# Patient Record
Sex: Male | Born: 1960
Health system: Southern US, Community
[De-identification: ages and names within clinical notes are randomized; demographics above are authoritative.]

## PROBLEM LIST (undated history)

## (undated) DIAGNOSIS — C3490 Malignant neoplasm of unspecified part of unspecified bronchus or lung: Secondary | ICD-10-CM

## (undated) DIAGNOSIS — K219 Gastro-esophageal reflux disease without esophagitis: Secondary | ICD-10-CM

## (undated) DIAGNOSIS — T7840XA Allergy, unspecified, initial encounter: Secondary | ICD-10-CM

## (undated) DIAGNOSIS — G8929 Other chronic pain: Secondary | ICD-10-CM

## (undated) DIAGNOSIS — E119 Type 2 diabetes mellitus without complications: Secondary | ICD-10-CM

## (undated) DIAGNOSIS — I10 Essential (primary) hypertension: Secondary | ICD-10-CM

## (undated) DIAGNOSIS — R0600 Dyspnea, unspecified: Secondary | ICD-10-CM

## (undated) DIAGNOSIS — M1990 Unspecified osteoarthritis, unspecified site: Secondary | ICD-10-CM

## (undated) DIAGNOSIS — M549 Dorsalgia, unspecified: Secondary | ICD-10-CM

## (undated) DIAGNOSIS — G43909 Migraine, unspecified, not intractable, without status migrainosus: Secondary | ICD-10-CM

## (undated) DIAGNOSIS — I129 Hypertensive chronic kidney disease with stage 1 through stage 4 chronic kidney disease, or unspecified chronic kidney disease: Secondary | ICD-10-CM

## (undated) HISTORY — DX: Allergy, unspecified, initial encounter: T78.40XA

## (undated) HISTORY — PX: LUNG CANCER SURGERY: SHX702

## (undated) HISTORY — DX: Migraine, unspecified, not intractable, without status migrainosus: G43.909

## (undated) HISTORY — DX: Other chronic pain: G89.29

## (undated) HISTORY — PX: BACK SURGERY: SHX140

## (undated) HISTORY — PX: FOOT SURGERY: SHX648

## (undated) HISTORY — DX: Dorsalgia, unspecified: M54.9

## (undated) HISTORY — PX: LEG SURGERY: SHX1003

## (undated) HISTORY — PX: APPENDECTOMY: SHX54

## (undated) HISTORY — PX: KNEE SURGERY: SHX244

## (undated) HISTORY — PX: DG OPERATIVE LEFT HIP (ARMC HX): HXRAD1580

---

## 2014-07-07 ENCOUNTER — Other Ambulatory Visit: Payer: Self-pay

## 2014-07-07 ENCOUNTER — Emergency Department
Admission: EM | Admit: 2014-07-07 | Discharge: 2014-07-07 | Disposition: A | Discharge status: Home / Self Care | Payer: Self-pay | Attending: Emergency Medicine | Admitting: Emergency Medicine

## 2014-07-07 ENCOUNTER — Encounter: Payer: Self-pay | Admitting: Emergency Medicine

## 2014-07-07 DIAGNOSIS — Z720 Tobacco use: Secondary | ICD-10-CM | POA: Insufficient documentation

## 2014-07-07 DIAGNOSIS — R51 Headache: Secondary | ICD-10-CM | POA: Insufficient documentation

## 2014-07-07 DIAGNOSIS — G8929 Other chronic pain: Secondary | ICD-10-CM | POA: Insufficient documentation

## 2014-07-07 DIAGNOSIS — R519 Headache, unspecified: Secondary | ICD-10-CM

## 2014-07-07 DIAGNOSIS — I10 Essential (primary) hypertension: Secondary | ICD-10-CM | POA: Insufficient documentation

## 2014-07-07 HISTORY — DX: Essential (primary) hypertension: I10

## 2014-07-07 MED ORDER — AMLODIPINE BESYLATE 5 MG PO TABS
10.0000 mg | ORAL_TABLET | Freq: Once | ORAL | Status: AC
  Administered 2014-07-07: 10 mg via ORAL

## 2014-07-07 MED ORDER — AMLODIPINE BESYLATE 10 MG PO TABS: 10.0000 mg | ORAL_TABLET | Freq: Every day | ORAL | Status: DC

## 2014-07-07 MED ORDER — VALSARTAN 320 MG PO TABS: 320.0000 mg | ORAL_TABLET | Freq: Every day | ORAL | Status: DC

## 2014-07-07 MED ORDER — IRBESARTAN 150 MG PO TABS
300.0000 mg | ORAL_TABLET | Freq: Every day | ORAL | Status: DC
  Administered 2014-07-07: 300 mg via ORAL
  Filled 2014-07-07 (×3): qty 2

## 2014-07-07 MED ORDER — AMLODIPINE BESYLATE 5 MG PO TABS
ORAL_TABLET | ORAL | Status: AC
  Administered 2014-07-07: 10 mg via ORAL
  Filled 2014-07-07: qty 2

## 2014-07-07 MED ORDER — HYDROCHLOROTHIAZIDE 12.5 MG PO TABS: 25.0000 mg | ORAL_TABLET | Freq: Every day | ORAL | Status: DC

## 2014-07-07 MED ORDER — HYDROCHLOROTHIAZIDE 25 MG PO TABS
ORAL_TABLET | ORAL | Status: AC
  Administered 2014-07-07: 25 mg via ORAL
  Filled 2014-07-07: qty 1

## 2014-07-07 MED ORDER — HYDROCHLOROTHIAZIDE 25 MG PO TABS
25.0000 mg | ORAL_TABLET | Freq: Every day | ORAL | Status: DC
  Administered 2014-07-07: 25 mg via ORAL
  Filled 2014-07-07 (×2): qty 1

## 2014-07-07 NOTE — ED Provider Notes (Signed)
[facility] Emergency Department Provider Note    ____________________________________________  Time seen: 6 PM  I have reviewed the triage vital signs and the nursing notes.   HISTORY  Chief Complaint Hypertension       HPI Kristopher [name] is a 54 y.o. male with a history of hypertension who has been off of his hypertension meds for the past 2.5 weeks. The patient says that he recently moved from [place]" and has not been able to obtain a primary care physician in [place]. He says furthermore that he also does not have insurance coverage until July. He says he has tried to obtain more recent coverage but is unable to because of the amount of money that he makes. He is here because he began having a waxing and waning frontal headache since he has been off of his hypertension medications. He says that he has had the seventh headache in the past. It is dull and frontal and he rates it as a 6 out of 10 right now. There was no sudden onset of the headache or thunderclap onset. He denies any nausea vomiting diaphoresis or chest pain.He denies any numbness or weakness as well.   Past Medical History  Diagnosis Date  . Hypertension     There are no active problems to display for this patient.   Past Surgical History  Procedure Laterality Date  . Back surgery    . Appendectomy    . Leg surgery      Current Outpatient Rx  Name  Route  Sig  Dispense  Refill  . oxyCODONE-acetaminophen (PERCOCET) 10-325 MG per tablet   Oral   Take 1 tablet by mouth every 4 (four) hours as needed for pain.           Allergies Lidocaine  History reviewed. No pertinent family history.  Social History History  Substance Use Topics  . Smoking status: Current Some Day Smoker  . Smokeless tobacco: Not on file  . Alcohol Use: No    Review of Systems  Constitutional: Negative for fever. Eyes: Negative for visual changes. ENT: Negative for sore  throat. Cardiovascular: Negative for chest pain. Respiratory: Negative for shortness of breath. Gastrointestinal: Negative for abdominal pain, vomiting and diarrhea. Genitourinary: Negative for dysuria. Musculoskeletal: Negative for back pain. Skin: Negative for rash. Neurological: 6 out of 10 frontal headache.   10-point ROS otherwise negative.  ____________________________________________   PHYSICAL EXAM:  VITAL SIGNS: ED Triage Vitals  Enc Vitals Group     BP 07/07/14 1641 188/111 mmHg     Pulse Rate 07/07/14 1641 81     Resp 07/07/14 1641 18     Temp 07/07/14 1641 98.8 F (37.1 C)     Temp Source 07/07/14 1641 Oral     SpO2 07/07/14 1641 98 %     Weight 07/07/14 1641 200 lb (90.719 kg)     Height 07/07/14 1641 '5\' 10"'$  (1.778 m)     Head Cir --      Peak Flow --      Pain Score 07/07/14 1642 10     Pain Loc --      Pain Edu? --      Excl. in [place]? --      Constitutional: Alert and oriented. Well appearing and in no distress. Eyes: Conjunctivae are normal. PERRL. Normal extraocular movements. ENT   Head: Normocephalic and atraumatic.   Nose: No congestion/rhinnorhea.   Mouth/Throat: Mucous membranes are moist.   Neck:  No stridor. Hematological/Lymphatic/Immunilogical: No cervical lymphadenopathy. Cardiovascular: Normal rate, regular rhythm. Normal and symmetric distal pulses are present in all extremities. No murmurs, rubs, or gallops. Respiratory: Normal respiratory effort without tachypnea nor retractions. Breath sounds are clear and equal bilaterally. No wheezes/rales/rhonchi. Gastrointestinal: Soft and nontender. No distention. No abdominal bruits. There is no CVA tenderness. Genitourinary: Deferred Musculoskeletal: Nontender with normal range of motion in all extremities. No joint effusions.  No lower extremity tenderness nor edema. Neurologic:  Normal speech and language. No gross focal neurologic deficits are appreciated. Speech is normal. No gait  instability. Skin:  Skin is warm, dry and intact. No rash noted. Psychiatric: Mood and affect are normal. Speech and behavior are normal. Patient exhibits appropriate insight and judgment.  ____________________________________________    LABS (pertinent positives/negatives)    ____________________________________________   EKG   Date: 07/07/2014  Rate: 71  Rhythm: normal sinus rhythm  QRS Axis: normal  Intervals: normal  ST/T Wave abnormalities: normal  Conduction Disutrbances: none  Narrative Interpretation: unremarkable      ____________________________________________    RADIOLOGY    ____________________________________________   PROCEDURES  Procedure(s) performed:   Critical Care performed:   ____________________________________________   INITIAL IMPRESSION / ASSESSMENT AND PLAN / ED COURSE  Pertinent labs & imaging results that were available during my care of the patient were reviewed by me and considered in my medical decision making (see chart for details).  The patient is well-appearing. It is likely that his headache is caused by his blood pressure. I do not see any signs of neurologic impairment. I do not feel that imaging or lab work at this time is necessary. We will try him stepwise to restart his blood pressure medications. We will monitor his blood pressure number emergency Department and monitor him for improvement of his symptoms.  ----------------------------------------- 7:32 PM on 07/07/2014 -----------------------------------------  Patient continues to have a headache and blood pressures only mildly improved. We'll give angiotensin receptor blocker in addition to other 2 medications and reassess. Patient continues to be neurologically intact. In no distress.  ----------------------------------------- 8:34 PM on 07/07/2014 -----------------------------------------  After being given ARB, the patient's blood pressure has resolved  to 140/100. The patient says he is feeling much better and the headache has resolved. I will prescribe him his home medications and give him follow-up with the [facility]. Still remains neurologically intact.  No chest pain or shortness of breath throughout.   __________________________________________   FINAL CLINICAL IMPRESSION(S) / ED DIAGNOSES  Acute on chronic hypertension. Headache secondary to hypertension. Acute, initial visit.   Doran Stabler, MD 07/07/14 2035

## 2014-07-07 NOTE — ED Notes (Signed)
Pt reports that he has had a headache for the last several days and also had hypertension. He reports that he has been out of his B/P medication for the last week.

## 2014-07-07 NOTE — ED Notes (Signed)
Pt with headache for two days along with high blood pressure. Has been out of meds for one week.

## 2014-11-11 ENCOUNTER — Encounter: Payer: Self-pay | Admitting: Emergency Medicine

## 2014-11-11 ENCOUNTER — Emergency Department
Admission: EM | Admit: 2014-11-11 | Discharge: 2014-11-11 | Disposition: A | Discharge status: Home / Self Care | Payer: Medicare Other | Attending: Emergency Medicine | Admitting: Emergency Medicine

## 2014-11-11 DIAGNOSIS — Z880 Allergy status to penicillin: Secondary | ICD-10-CM | POA: Diagnosis not present

## 2014-11-11 DIAGNOSIS — G8921 Chronic pain due to trauma: Secondary | ICD-10-CM | POA: Diagnosis not present

## 2014-11-11 DIAGNOSIS — M549 Dorsalgia, unspecified: Secondary | ICD-10-CM | POA: Insufficient documentation

## 2014-11-11 DIAGNOSIS — G8929 Other chronic pain: Secondary | ICD-10-CM

## 2014-11-11 DIAGNOSIS — Z720 Tobacco use: Secondary | ICD-10-CM | POA: Insufficient documentation

## 2014-11-11 DIAGNOSIS — I10 Essential (primary) hypertension: Secondary | ICD-10-CM | POA: Insufficient documentation

## 2014-11-11 DIAGNOSIS — Z79899 Other long term (current) drug therapy: Secondary | ICD-10-CM | POA: Insufficient documentation

## 2014-11-11 MED ORDER — TRAMADOL HCL 50 MG PO TABS: 50.0000 mg | ORAL_TABLET | Freq: Four times a day (QID) | ORAL | Status: DC | PRN

## 2014-11-11 NOTE — ED Provider Notes (Signed)
[facility] Emergency Department Provider Note  ____________________________________________  Time seen: Approximately 10:53 AM  I have reviewed the triage vital signs and the nursing notes.   HISTORY  Chief Complaint Back Pain   HPI Kristopher [name] is a 54 y.o. male is here for refill on his Percocet. He states that he has chronic back pain due to workman's comp injury and moved from [place] to [place]. He states he has an appointment with the pain clinic here in October but he is being required to see a psychiatrist for evaluation first. He states there is no change in his back pain. There is no bladder or bowel incontinence. He denies any paresthesias in his legs. Currently his pain is 6 out of 10.   Past Medical History  Diagnosis Date  . Hypertension     There are no active problems to display for this patient.   Past Surgical History  Procedure Laterality Date  . Back surgery    . Appendectomy    . Leg surgery      Current Outpatient Rx  Name  Route  Sig  Dispense  Refill  . amLODipine (NORVASC) 10 MG tablet   Oral   Take 1 tablet (10 mg total) by mouth daily.   30 tablet   0   . hydrochlorothiazide (HYDRODIURIL) 12.5 MG tablet   Oral   Take 2 tablets (25 mg total) by mouth daily.   30 tablet   0   . oxyCODONE-acetaminophen (PERCOCET) 10-325 MG per tablet   Oral   Take 1 tablet by mouth every 4 (four) hours as needed for pain.         . traMADol (ULTRAM) 50 MG tablet   Oral   Take 1 tablet (50 mg total) by mouth every 6 (six) hours as needed.   20 tablet   0   . valsartan (DIOVAN) 320 MG tablet   Oral   Take 1 tablet (320 mg total) by mouth daily.   30 tablet   0     Allergies Lidocaine and Penicillins  No family history on file.  Social History Social History  Substance Use Topics  . Smoking status: Current Some Day Smoker  . Smokeless tobacco: None  . Alcohol Use: No    Review of  Systems Constitutional: No fever/chills ENT: No sore throat. Cardiovascular: Denies chest pain. Respiratory: Denies shortness of breath. Gastrointestinal: No abdominal pain.  No nausea, no vomiting.  No diarrhea.  No constipation. Genitourinary: Negative for dysuria. Musculoskeletal: Positive for back pain. Skin: Negative for rash. Neurological: Negative for headaches, focal weakness or numbness.  10-point ROS otherwise negative.  ____________________________________________   PHYSICAL EXAM:  VITAL SIGNS: ED Triage Vitals  Enc Vitals Group     BP 11/11/14 1017 132/95 mmHg     Pulse Rate 11/11/14 1017 74     Resp 11/11/14 1017 18     Temp 11/11/14 1017 98.1 F (36.7 C)     Temp src --      SpO2 11/11/14 1017 100 %     Weight --      Height --      Head Cir --      Peak Flow --      Pain Score 11/11/14 1017 6     Pain Loc --      Pain Edu? --      Excl. in [place]? --     Constitutional: Alert and oriented. Well appearing and  in no acute distress. Patient is wearing his back brace. Eyes: Conjunctivae are normal. PERRL. EOMI. Head: Atraumatic. Nose: No congestion/rhinnorhea. Neck: No stridor.   Cardiovascular: Normal rate, regular rhythm. Grossly normal heart sounds.  Good peripheral circulation. Respiratory: Normal respiratory effort.  No retractions. Lungs CTAB. Gastrointestinal: Soft and nontender. No distention. No abdominal bruits. No CVA tenderness. Musculoskeletal: Back exam shows tenderness on palpation lower lumbar spine bilaterally. No active muscle spasms were seen in patient's range of motion was slightly restricted secondary to back brace. Muscle strength bilaterally lower legs was equal. No lower extremity tenderness nor edema.  No joint effusions. Straight leg raises was approximately 45 with some discomfort. Neurologic:  Normal speech and language. No gross focal neurologic deficits are appreciated. No gait instability. Reflexes 1+ bilaterally Skin:  Skin is  warm, dry and intact. No rash noted. Psychiatric: Mood and affect are normal. Speech and behavior are normal.  ____________________________________________   LABS (all labs ordered are listed, but only abnormal results are displayed)  Labs Reviewed - No data to display  PROCEDURES  Procedure(s) performed: None  Critical Care performed: No  ____________________________________________   INITIAL IMPRESSION / ASSESSMENT AND PLAN / ED COURSE  Pertinent labs & imaging results that were available during my care of the patient were reviewed by me and considered in my medical decision making (see chart for details).  Patient was given a prescription for tramadol No. 20 tablets. He was told that we do not refill Percocet and that he will need to obtain a primary care doctor as well. He states his blood pressure is only up when he is having problems with his back. He is currently taking something for hypertension. He was encouraged to keep this appointment with his psychiatrist and also his appointment at the pain clinic. ____________________________________________   FINAL CLINICAL IMPRESSION(S) / ED DIAGNOSES  Final diagnoses:  Chronic back pain greater than 3 months duration      Johnn Hai, PA-C 11/11/14 [place], MD 11/12/14 [phone]

## 2014-11-11 NOTE — ED Notes (Signed)
Pt reports moving here from [place]. Pt ran out of pain meds and needs a refill. Pt reports having an appointment with pain clinic coming up

## 2014-11-11 NOTE — Discharge Instructions (Signed)
° °  Keep your appointment with Pain Clinic for October

## 2014-11-11 NOTE — ED Notes (Signed)
States he is out of percocet . States he has chronic back pain d/t wc injury. Has an appointment for new pain clinic in oct. Ambulates well

## 2014-12-05 ENCOUNTER — Encounter: Payer: Self-pay | Admitting: Pain Medicine

## 2014-12-05 ENCOUNTER — Ambulatory Visit: Payer: Medicare Other | Attending: Pain Medicine | Admitting: Pain Medicine

## 2014-12-05 VITALS — BP 162/110 | HR 84 | Temp 98.7°F | Resp 18 | Ht 70.0 in | Wt 210.0 lb

## 2014-12-05 DIAGNOSIS — M79605 Pain in left leg: Secondary | ICD-10-CM

## 2014-12-05 DIAGNOSIS — M545 Low back pain, unspecified: Secondary | ICD-10-CM

## 2014-12-05 DIAGNOSIS — M792 Neuralgia and neuritis, unspecified: Secondary | ICD-10-CM

## 2014-12-05 DIAGNOSIS — M79602 Pain in left arm: Secondary | ICD-10-CM

## 2014-12-05 DIAGNOSIS — M961 Postlaminectomy syndrome, not elsewhere classified: Secondary | ICD-10-CM

## 2014-12-05 DIAGNOSIS — M542 Cervicalgia: Secondary | ICD-10-CM | POA: Diagnosis not present

## 2014-12-05 DIAGNOSIS — F1120 Opioid dependence, uncomplicated: Secondary | ICD-10-CM | POA: Insufficient documentation

## 2014-12-05 DIAGNOSIS — Z79899 Other long term (current) drug therapy: Secondary | ICD-10-CM | POA: Insufficient documentation

## 2014-12-05 DIAGNOSIS — G894 Chronic pain syndrome: Secondary | ICD-10-CM

## 2014-12-05 DIAGNOSIS — M4712 Other spondylosis with myelopathy, cervical region: Secondary | ICD-10-CM

## 2014-12-05 DIAGNOSIS — G8929 Other chronic pain: Secondary | ICD-10-CM

## 2014-12-05 MED ORDER — OXYCODONE HCL 10 MG PO TABS: 10.0000 mg | ORAL_TABLET | Freq: Four times a day (QID) | ORAL | Status: DC | PRN

## 2014-12-05 NOTE — Patient Instructions (Signed)
Smoking Cessation Quitting smoking is important to your health and has many advantages. However, it is not always easy to quit since nicotine is a very addictive drug. Oftentimes, people try 3 times or more before being able to quit. This document explains the best ways for you to prepare to quit smoking. Quitting takes hard work and a lot of effort, but you can do it. ADVANTAGES OF QUITTING SMOKING  You will live longer, feel better, and live better.  Your body will feel the impact of quitting smoking almost immediately.  Within 20 minutes, blood pressure decreases. Your pulse returns to its normal level.  After 8 hours, carbon monoxide levels in the blood return to normal. Your oxygen level increases.  After 24 hours, the chance of having a heart attack starts to decrease. Your breath, hair, and body stop smelling like smoke.  After 48 hours, damaged nerve endings begin to recover. Your sense of taste and smell improve.  After 72 hours, the body is virtually free of nicotine. Your bronchial tubes relax and breathing becomes easier.  After 2 to 12 weeks, lungs can hold more air. Exercise becomes easier and circulation improves.  The risk of having a heart attack, stroke, cancer, or lung disease is greatly reduced.  After 1 year, the risk of coronary heart disease is cut in half.  After 5 years, the risk of stroke falls to the same as a nonsmoker.  After 10 years, the risk of lung cancer is cut in half and the risk of other cancers decreases significantly.  After 15 years, the risk of coronary heart disease drops, usually to the level of a nonsmoker.  If you are pregnant, quitting smoking will improve your chances of having a healthy baby.  The people you live with, especially any children, will be healthier.  You will have extra money to spend on things other than cigarettes. QUESTIONS TO THINK ABOUT BEFORE ATTEMPTING TO QUIT You may want to talk about your answers with your  health care provider.  Why do you want to quit?  If you tried to quit in the past, what helped and what did not?  What will be the most difficult situations for you after you quit? How will you plan to handle them?  Who can help you through the tough times? Your family? Friends? A health care provider?  What pleasures do you get from smoking? What ways can you still get pleasure if you quit? Here are some questions to ask your health care provider:  How can you help me to be successful at quitting?  What medicine do you think would be best for me and how should I take it?  What should I do if I need more help?  What is smoking withdrawal like? How can I get information on withdrawal? GET READY  Set a quit date.  Change your environment by getting rid of all cigarettes, ashtrays, matches, and lighters in your home, car, or work. Do not let people smoke in your home.  Review your past attempts to quit. Think about what worked and what did not. GET SUPPORT AND ENCOURAGEMENT You have a better chance of being successful if you have help. You can get support in many ways.  Tell your family, friends, and coworkers that you are going to quit and need their support. Ask them not to smoke around you.  Get individual, group, or telephone counseling and support. Programs are available at local hospitals and health centers. Call   your local health department for information about programs in your area.  Spiritual beliefs and practices may help some smokers quit.  Download a "quit meter" on your computer to keep track of quit statistics, such as how long you have gone without smoking, cigarettes not smoked, and money saved.  Get a self-help book about quitting smoking and staying off tobacco. LEARN NEW SKILLS AND BEHAVIORS  Distract yourself from urges to smoke. Talk to someone, go for a walk, or occupy your time with a task.  Change your normal routine. Take a different route to work.  Drink tea instead of coffee. Eat breakfast in a different place.  Reduce your stress. Take a hot bath, exercise, or read a book.  Plan something enjoyable to do every day. Reward yourself for not smoking.  Explore interactive web-based programs that specialize in helping you quit. GET MEDICINE AND USE IT CORRECTLY Medicines can help you stop smoking and decrease the urge to smoke. Combining medicine with the above behavioral methods and support can greatly increase your chances of successfully quitting smoking.  Nicotine replacement therapy helps deliver nicotine to your body without the negative effects and risks of smoking. Nicotine replacement therapy includes nicotine gum, lozenges, inhalers, nasal sprays, and skin patches. Some may be available over-the-counter and others require a prescription.  Antidepressant medicine helps people abstain from smoking, but how this works is unknown. This medicine is available by prescription.  Nicotinic receptor partial agonist medicine simulates the effect of nicotine in your brain. This medicine is available by prescription. Ask your health care provider for advice about which medicines to use and how to use them based on your health history. Your health care provider will tell you what side effects to look out for if you choose to be on a medicine or therapy. Carefully read the information on the package. Do not use any other product containing nicotine while using a nicotine replacement product.  RELAPSE OR DIFFICULT SITUATIONS Most relapses occur within the first 3 months after quitting. Do not be discouraged if you start smoking again. Remember, most people try several times before finally quitting. You may have symptoms of withdrawal because your body is used to nicotine. You may crave cigarettes, be irritable, feel very hungry, cough often, get headaches, or have difficulty concentrating. The withdrawal symptoms are only temporary. They are strongest  when you first quit, but they will go away within 10-14 days. To reduce the chances of relapse, try to:  Avoid drinking alcohol. Drinking lowers your chances of successfully quitting.  Reduce the amount of caffeine you consume. Once you quit smoking, the amount of caffeine in your body increases and can give you symptoms, such as a rapid heartbeat, sweating, and anxiety.  Avoid smokers because they can make you want to smoke.  Do not let weight gain distract you. Many smokers will gain weight when they quit, usually less than 10 pounds. Eat a healthy diet and stay active. You can always lose the weight gained after you quit.  Find ways to improve your mood other than smoking. FOR MORE INFORMATION  www.smokefree.gov  Document Released: 02/12/2001 Document Revised: 07/05/2013 Document Reviewed: 05/30/2011 [employer] Patient Information 2015 [employer]. This information is not intended to replace advice given to you by your health care provider. Make sure you discuss any questions you have with your health care provider.  

## 2014-12-05 NOTE — Progress Notes (Signed)
Safety precautions to be maintained throughout the outpatient stay will include: orient to surroundings, keep bed in low position, maintain call bell within reach at all times, provide assistance with transfer out of bed and ambulation. Pill count oxycodone #1 Tramadol #2

## 2014-12-13 ENCOUNTER — Other Ambulatory Visit: Payer: Self-pay | Admitting: Pain Medicine

## 2015-01-05 ENCOUNTER — Encounter: Payer: Self-pay | Admitting: Pain Medicine

## 2015-01-05 ENCOUNTER — Ambulatory Visit: Payer: Medicare HMO | Attending: Pain Medicine | Admitting: Pain Medicine

## 2015-01-05 ENCOUNTER — Other Ambulatory Visit: Payer: Self-pay | Admitting: Pain Medicine

## 2015-01-05 VITALS — BP 134/98 | HR 72 | Temp 98.2°F | Resp 16 | Ht 70.0 in | Wt 205.0 lb

## 2015-01-05 DIAGNOSIS — M5441 Lumbago with sciatica, right side: Secondary | ICD-10-CM

## 2015-01-05 DIAGNOSIS — M5442 Lumbago with sciatica, left side: Secondary | ICD-10-CM

## 2015-01-05 DIAGNOSIS — M4802 Spinal stenosis, cervical region: Secondary | ICD-10-CM | POA: Diagnosis not present

## 2015-01-05 DIAGNOSIS — G8929 Other chronic pain: Secondary | ICD-10-CM | POA: Insufficient documentation

## 2015-01-05 DIAGNOSIS — F1120 Opioid dependence, uncomplicated: Secondary | ICD-10-CM

## 2015-01-05 DIAGNOSIS — M542 Cervicalgia: Secondary | ICD-10-CM | POA: Insufficient documentation

## 2015-01-05 DIAGNOSIS — M5412 Radiculopathy, cervical region: Secondary | ICD-10-CM

## 2015-01-05 DIAGNOSIS — Z79899 Other long term (current) drug therapy: Secondary | ICD-10-CM

## 2015-01-05 DIAGNOSIS — M47816 Spondylosis without myelopathy or radiculopathy, lumbar region: Secondary | ICD-10-CM | POA: Diagnosis not present

## 2015-01-05 DIAGNOSIS — M545 Low back pain, unspecified: Secondary | ICD-10-CM

## 2015-01-05 DIAGNOSIS — M47812 Spondylosis without myelopathy or radiculopathy, cervical region: Secondary | ICD-10-CM | POA: Diagnosis not present

## 2015-01-05 DIAGNOSIS — M539 Dorsopathy, unspecified: Secondary | ICD-10-CM

## 2015-01-05 DIAGNOSIS — M549 Dorsalgia, unspecified: Secondary | ICD-10-CM | POA: Diagnosis present

## 2015-01-05 DIAGNOSIS — M4726 Other spondylosis with radiculopathy, lumbar region: Secondary | ICD-10-CM | POA: Diagnosis not present

## 2015-01-05 DIAGNOSIS — M5416 Radiculopathy, lumbar region: Secondary | ICD-10-CM | POA: Diagnosis not present

## 2015-01-05 DIAGNOSIS — M961 Postlaminectomy syndrome, not elsewhere classified: Secondary | ICD-10-CM

## 2015-01-05 DIAGNOSIS — F1190 Opioid use, unspecified, uncomplicated: Secondary | ICD-10-CM

## 2015-01-05 DIAGNOSIS — Z5181 Encounter for therapeutic drug level monitoring: Secondary | ICD-10-CM | POA: Insufficient documentation

## 2015-01-05 DIAGNOSIS — Z79891 Long term (current) use of opiate analgesic: Secondary | ICD-10-CM | POA: Insufficient documentation

## 2015-01-05 MED ORDER — OXYCODONE HCL 10 MG PO TABS: 10.0000 mg | ORAL_TABLET | Freq: Four times a day (QID) | ORAL | Status: DC | PRN

## 2015-01-05 NOTE — Progress Notes (Signed)
Patient's Name: Kristopher [name] MRN: [id number] DOB: [birth year] DOS: 01/05/2015  Primary Reason(s) for Visit: Encounter for Medication Management. CC: Back Pain and Neck Pain   HPI:   Kristopher [name] is a 54 y.o. year old, male patient, who returns today as an established patient. He has Chronic pain; Chronic low back pain; Lumbar spondylosis; Chronic radicular lumbar pain (left) (S1 dermatomal); Failed back surgical syndrome; Chronic neck pain; Cervical spondylosis; Chronic cervical radicular pain (left side) (C5/C6 dermatome); Long term current use of opiate analgesic; Long term prescription opiate use; Opiate use; Opioid dependence ([place]); Encounter for therapeutic drug level monitoring; Cervical spinal stenosis (C4-5); and Cervical foraminal stenosis (Bilateral C5-6) on his problem list.. His primarily concern today is the Back Pain and Neck Pain    The patient returns today after his initial prescription. The results of the urine drug screen test with abnormal and they were discussed with the patient. The patient was given a final warning with her as to this. He understood and accepted. I will repeat a UDS and see him back in one month. Today's Pain Score: 5  Pain Descriptors / Indicators: Burning, Stabbing, Constant Pain Frequency: Constant  Date of Last Visit: Date of Last Visit: 12/05/14 Service Provided on Last Visit: Service Provided on Last Visit: Evaluation, Med Refill  Pharmacotherapy Review:   Side-effects or Adverse reactions: None reported. Effectiveness: Described as relatively effective, allowing for increase in activities of daily living (ADL). Onset of action: Within expected pharmacological parameters. Duration of action: Within normal limits for medication. Peak effect: Timing and results are as within normal expected parameters.  PMP: Compliant with practice rules and regulations. DST: Abnormal. Results discussed with patient. Unreported substances found on the UDS. No  tramadol. Hydrocodone identified. Today I have curetted the patient on last and final warning with regards to this. Lab work: No new labs ordered by our practice. Treatment compliance: Compliant. Substance Use Disorder (SUD) Risk Level: Low Planned course of action: Continue therapy as is.  Allergies: Kristopher [name] is allergic to lidocaine and penicillins.  Meds: The patient has a current medication list which includes the following prescription(s): amlodipine, oxycodone hcl, and sumatriptan. Requested Prescriptions   Signed Prescriptions Disp Refills  . Oxycodone HCl 10 MG TABS 120 tablet 0    Sig: Take 1 tablet (10 mg total) by mouth every 6 (six) hours as needed.    ROS: Constitutional: Afebrile, no chills, well hydrated and well nourished Gastrointestinal: negative Musculoskeletal:negative Neurological: negative Behavioral/Psych: negative  PFSH: Medical:  Kristopher [name]  has a past medical history of Hypertension and Allergy. Family: family history includes Cancer in his father. Surgical:  has past surgical history that includes Back surgery; Appendectomy; and Leg Surgery. Tobacco:  reports that he has been smoking.  He does not have any smokeless tobacco history on file. Alcohol:  reports that he does not drink alcohol. Drug:  has no drug history on file.  Physical Exam: Vitals:  Today's Vitals   01/05/15 1036  BP: 134/98  Pulse: 72  Temp: 98.2 F (36.8 C)  TempSrc: Oral  Resp: 16  Height: '5\' 10"'$  (1.778 m)  Weight: 205 lb (92.987 kg)  SpO2: 98%  PainSc: 5   Calculated BMI: Body mass index is 29.41 kg/(m^2). General appearance: alert, cooperative, appears stated age, no distress and moderately obese Eyes: conjunctivae/corneas clear. PERRL, EOM's intact. Fundi benign. Lungs: No evidence respiratory distress, no audible rales or ronchi and no use of accessory muscles of respiration Neck: no adenopathy, no carotid  bruit, no JVD, supple, symmetrical, trachea midline and  thyroid not enlarged, symmetric, no tenderness/mass/nodules Back: symmetric, no curvature. ROM normal. No CVA tenderness. Extremities: extremities normal, atraumatic, no cyanosis or edema Pulses: 2+ and symmetric Skin: Skin color, texture, turgor normal. No rashes or lesions Neurologic: Grossly normal    Assessment: Encounter Diagnosis:  Primary Diagnosis: Chronic pain [G89.29]  Plan: Kristopher [name] was seen today for back pain and neck pain.  Diagnoses and all orders for this visit:  Chronic pain -     Oxycodone HCl 10 MG TABS; Take 1 tablet (10 mg total) by mouth every 6 (six) hours as needed.  Chronic low back pain  Osteoarthritis of spine with radiculopathy, lumbar region  Chronic radicular lumbar pain (left) (S1 dermatomal)  Failed back surgical syndrome  Chronic neck pain  Cervical spondylosis  Chronic cervical radicular pain (left side) (C5/C6 dermatome)  Long term current use of opiate analgesic -     Drugs of abuse screen w/o alc, rtn urine-sln; Standing  Long term prescription opiate use  Opiate use  Uncomplicated opioid dependence ([place])  Encounter for therapeutic drug level monitoring  Cervical spinal stenosis (C4-5)  Cervical foraminal stenosis (Bilateral C5-6)     There are no Patient Instructions on file for this visit. Medications discontinued today:  Medications Discontinued During This Encounter  Medication Reason  . amLODipine-valsartan (EXFORGE) 10-320 MG tablet Error  . hydrochlorothiazide (HYDRODIURIL) 12.5 MG tablet Error  . valsartan (DIOVAN) 320 MG tablet Error  . Oxycodone HCl 10 MG TABS Reorder   Medications administered today:  Kristopher [name] had no medications administered during this visit.  Primary Care Physician: Freddy Finner, NP Location: [facility] Note by: Beatriz Chancellor A. Dossie Arbour, M.D, DABA, DABAPM, DABPM, DABIPP, FIPP

## 2015-01-05 NOTE — Progress Notes (Signed)
Safety precautions to be maintained throughout the outpatient stay will include: orient to surroundings, keep bed in low position, maintain call bell within reach at all times, provide assistance with transfer out of bed and ambulation.  #4 oxycodone'10mg'$  at pill count today.

## 2015-01-06 DIAGNOSIS — M4802 Spinal stenosis, cervical region: Secondary | ICD-10-CM | POA: Insufficient documentation

## 2015-01-14 LAB — TOXASSURE SELECT 13 (MW), URINE: PDF: 0

## 2015-01-28 ENCOUNTER — Emergency Department
Admission: EM | Admit: 2015-01-28 | Discharge: 2015-01-28 | Disposition: A | Discharge status: Home / Self Care | Payer: Medicare HMO | Attending: Emergency Medicine | Admitting: Emergency Medicine

## 2015-01-28 DIAGNOSIS — K047 Periapical abscess without sinus: Secondary | ICD-10-CM

## 2015-01-28 DIAGNOSIS — K029 Dental caries, unspecified: Secondary | ICD-10-CM | POA: Diagnosis not present

## 2015-01-28 DIAGNOSIS — Z79899 Other long term (current) drug therapy: Secondary | ICD-10-CM | POA: Diagnosis not present

## 2015-01-28 DIAGNOSIS — K0381 Cracked tooth: Secondary | ICD-10-CM | POA: Insufficient documentation

## 2015-01-28 DIAGNOSIS — K0889 Other specified disorders of teeth and supporting structures: Secondary | ICD-10-CM | POA: Diagnosis present

## 2015-01-28 DIAGNOSIS — Z880 Allergy status to penicillin: Secondary | ICD-10-CM | POA: Diagnosis not present

## 2015-01-28 DIAGNOSIS — F17200 Nicotine dependence, unspecified, uncomplicated: Secondary | ICD-10-CM | POA: Insufficient documentation

## 2015-01-28 DIAGNOSIS — I10 Essential (primary) hypertension: Secondary | ICD-10-CM | POA: Diagnosis not present

## 2015-01-28 LAB — CBC WITH DIFFERENTIAL/PLATELET
Basophils Absolute: 0.1 10*3/uL (ref 0–0.1)
Basophils Relative: 0 %
Eosinophils Absolute: 0 10*3/uL (ref 0–0.7)
Eosinophils Relative: 0 %
HCT: 40.6 % (ref 40.0–52.0)
Hemoglobin: 13.9 g/dL (ref 13.0–18.0)
Lymphocytes Relative: 4 %
Lymphs Abs: 0.7 10*3/uL — ABNORMAL LOW (ref 1.0–3.6)
MCH: 29.5 pg (ref 26.0–34.0)
MCHC: 34.3 g/dL (ref 32.0–36.0)
MCV: 86.2 fL (ref 80.0–100.0)
Monocytes Absolute: 1.2 10*3/uL — ABNORMAL HIGH (ref 0.2–1.0)
Monocytes Relative: 6 %
Neutro Abs: 16.8 10*3/uL — ABNORMAL HIGH (ref 1.4–6.5)
Neutrophils Relative %: 90 %
Platelets: 161 10*3/uL (ref 150–440)
RBC: 4.71 MIL/uL (ref 4.40–5.90)
RDW: 13.3 % (ref 11.5–14.5)
WBC: 18.9 10*3/uL — ABNORMAL HIGH (ref 3.8–10.6)

## 2015-01-28 LAB — BASIC METABOLIC PANEL
Anion gap: 9 (ref 5–15)
BUN: 12 mg/dL (ref 6–20)
CO2: 22 mmol/L (ref 22–32)
Calcium: 9.3 mg/dL (ref 8.9–10.3)
Chloride: 106 mmol/L (ref 101–111)
Creatinine, Ser: 1.16 mg/dL (ref 0.61–1.24)
GFR calc Af Amer: >60 mL/min (ref >60)
GFR calc non Af Amer: >60 mL/min (ref >60)
Glucose, Bld: 104 mg/dL — ABNORMAL HIGH (ref 65–99)
Potassium: 3.3 mmol/L — ABNORMAL LOW (ref 3.5–5.1)
Sodium: 137 mmol/L (ref 135–145)

## 2015-01-28 MED ORDER — KETOROLAC TROMETHAMINE 10 MG PO TABS: 10.0000 mg | ORAL_TABLET | Freq: Three times a day (TID) | ORAL | Status: DC

## 2015-01-28 MED ORDER — CLINDAMYCIN HCL 300 MG PO CAPS: 300.0000 mg | ORAL_CAPSULE | Freq: Four times a day (QID) | ORAL | Status: DC

## 2015-01-28 MED ORDER — SODIUM CHLORIDE 0.9 % IV BOLUS (SEPSIS)
1000.0000 mL | Freq: Once | INTRAVENOUS | Status: AC
  Administered 2015-01-28: 1000 mL via INTRAVENOUS

## 2015-01-28 MED ORDER — KETOROLAC TROMETHAMINE 30 MG/ML IJ SOLN
30.0000 mg | Freq: Once | INTRAMUSCULAR | Status: AC
  Administered 2015-01-28: 30 mg via INTRAVENOUS
  Filled 2015-01-28: qty 1

## 2015-01-28 MED ORDER — CLINDAMYCIN PHOSPHATE 600 MG/50ML IV SOLN
600.0000 mg | Freq: Once | INTRAVENOUS | Status: AC
  Administered 2015-01-28: 600 mg via INTRAVENOUS
  Filled 2015-01-28: qty 50

## 2015-01-28 NOTE — ED Notes (Signed)
Rt sided tooth ache since yesterday.

## 2015-01-28 NOTE — Discharge Instructions (Signed)
Dental Abscess A dental abscess is a collection of pus in or around a tooth. CAUSES This condition is caused by a bacterial infection around the root of the tooth that involves the inner part of the tooth (pulp). It may result from:  Severe tooth decay.  Trauma to the tooth that allows bacteria to enter into the pulp, such as a broken or chipped tooth.  Severe gum disease around a tooth. SYMPTOMS Symptoms of this condition include:  Severe pain in and around the infected tooth.  Swelling and redness around the infected tooth, in the mouth, or in the face.  Tenderness.  Pus drainage.  Bad breath.  Bitter taste in the mouth.  Difficulty swallowing.  Difficulty opening the mouth.  Nausea.  Vomiting.  Chills.  Swollen neck glands.  Fever. DIAGNOSIS This condition is diagnosed with examination of the infected tooth. During the exam, your dentist may tap on the infected tooth. Your dentist will also ask about your medical and dental history and may order X-rays. TREATMENT This condition is treated by eliminating the infection. This may be done with:  Antibiotic medicine.  A root canal. This may be performed to save the tooth.  Pulling (extracting) the tooth. This may also involve draining the abscess. This is done if the tooth cannot be saved. HOME CARE INSTRUCTIONS  Take medicines only as directed by your dentist.  If you were prescribed antibiotic medicine, finish all of it even if you start to feel better.  Rinse your mouth (gargle) often with salt water to relieve pain or swelling.  Do not drive or operate heavy machinery while taking pain medicine.  Do not apply heat to the outside of your mouth.  Keep all follow-up visits as directed by your dentist. This is important. SEEK MEDICAL CARE IF:  Your pain is worse and is not helped by medicine. SEEK IMMEDIATE MEDICAL CARE IF:  You have a fever or chills.  Your symptoms suddenly get worse.  You have a  very bad headache.  You have problems breathing or swallowing.  You have trouble opening your mouth.  You have swelling in your neck or around your eye.   This information is not intended to replace advice given to you by your health care provider. Make sure you discuss any questions you have with your health care provider.   Document Released: 02/18/2005 Document Revised: 07/05/2014 Document Reviewed: 02/15/2014 Elsevier Interactive Patient Education 2016 [place] the prescription antibiotic as directed, until completely gone. Rinse regularly with warm, salty water. Follow-up with one of the dental providers listed below.   OPTIONS FOR DENTAL FOLLOW UP CARE  [place] Department of Health and [place] [email or web address] [phone])  [facility] [phone])  [place] [phone])  [place] [phone])  [facility] [phone]) This clinic caters to the indigent population and is on a lottery system. Location: [employer] of Dentistry, [place], [place], [place] [facility] Hours: Wednesdays from 6pm - 9pm, patients seen by a lottery system. For dates, call or go to [email or web address] Services: Cleanings, fillings and simple extractions. Payment Options: DENTAL WORK IS FREE OF CHARGE. Bring proof of income or support. Best way to get seen: Arrive at 5:15 pm - this is a lottery, NOT first come/first serve, so arriving earlier will not increase your chances of being seen.     [facility] [phone] Select option 1 for emergencies   Location: [facility] of Dentistry, [place], [address] [place] [facility] Hours: No walk-ins accepted - call the day before to schedule an appointment. Check in times are 9:30  am and 1:30 pm. Services: Simple extractions, temporary fillings, pulpectomy/pulp debridement, uncomplicated abscess drainage. Payment Options: PAYMENT IS DUE AT THE TIME OF SERVICE.  Fee is usually $100-200, additional surgical procedures (e.g. abscess drainage) may be extra. Cash, checks, Visa/MasterCard accepted.  Can file Medicaid if patient is covered for dental - patient should call case worker to check. No discount for [facility] patients. Best way to get seen: MUST call the day before and get onto the schedule. Can usually be seen the next 1-2 days. No walk-ins accepted.     [place] [phone]   Location: [place], [place] [facility] Hours: M, W, Th, F 8am or 1:30pm, Tues 9a or 1:30 - first come/first served. Services: Simple extractions, temporary fillings, uncomplicated abscess drainage.  You do not need to be an [facility] resident. Payment Options: PAYMENT IS DUE AT THE TIME OF SERVICE. Dental insurance, otherwise sliding scale - bring proof of income or support. Depending on income and treatment needed, cost is usually $50-200. Best way to get seen: Arrive early as it is first come/first served.     [facility] [phone]   Location: [place] [facility] Hours: Mon-Thu 8a-5p Services: Most basic dental services including extractions and fillings. Payment Options: PAYMENT IS DUE AT THE TIME OF SERVICE. Sliding scale, up to 50% off - bring proof if income or support. Medicaid with dental option accepted. Best way to get seen: Call to schedule an appointment, can usually be seen within 2 weeks OR they will try to see walk-ins - show up at [place] or 2p (you may have to wait).     [facility] [place] RESIDENTS ONLY   Location: [facility], [place] [address], [place], [place] [address] Clinic Hours: By  appointment only. Monday - Thursday 8am-5pm, Friday 8am-12pm Services: Cleanings, fillings, extractions. Payment Options: PAYMENT IS DUE AT THE TIME OF SERVICE. Cash, Visa or MasterCard. Sliding scale - $30 minimum per service. Best way to get seen: Come in to office, complete packet and make an appointment - need proof of income or support monies for each household member and proof of [facility] residence. Usually takes about a month to get in.     [facility] [phone]   Location: [address] [place] [facility] Hours: Walk-in Urgent Care Dental Services are offered Monday-Friday mornings only. The numbers of emergencies accepted daily is limited to the number of providers available. Maximum 15 - Mondays, Wednesdays & Thursdays Maximum 10 - Tuesdays & Fridays Services: You do not need to be a [facility] resident to be seen for a dental emergency. Emergencies are defined as pain, swelling, abnormal bleeding, or dental trauma. Walkins will receive x-rays if needed. NOTE: Dental cleaning is not an emergency. Payment Options: PAYMENT IS DUE AT THE TIME OF SERVICE. Minimum co-pay is $40.00 for uninsured patients. Minimum co-pay is $3.00 for Medicaid with dental coverage. Dental Insurance is accepted and must be presented at time of visit. Medicare does not cover dental. Forms of payment: Cash, credit card, checks. Best way to get seen: If not previously registered with the clinic, walk-in dental registration begins at 7:15 am  and is on a first come/first serve basis. If previously registered with the clinic, call to make an appointment.     [facility] [place] ONLY   Location: [address] [place], [place] Clinic Hours: Mon-Thu 10a-2p Services: Extractions only! Payment Options: FREE (donations accepted) - bring proof of income or support Best way to get seen: Call and schedule an  appointment OR come at 8am on the 1st Monday of every month (except for holidays) when it is first come/first served.     [facility] [phone]   Location: [place], [place] Clinic Hours: Friday mornings Services, Payment Options, Best way to get seen: Call for info

## 2015-01-28 NOTE — ED Provider Notes (Signed)
[facility] Emergency Department Provider Note ____________________________________________  Time seen: 0930  I have reviewed the triage vital signs and the nursing notes.  HISTORY  Chief Complaint  Dental Pain  HPI Kristopher [name] is a 54 y.o. male reports to the ED for sudden swelling to the face due to a known fracture to the right upper jaw. He reports recent fractured a tooth about 2-3 days prior to arrival due to known history of dental cavities.[name] He awoke this morning with sudden swelling to the right upper face as well as increased pain. He denies any spontaneous, purulent drainage from the mouth. He has applied a temporary packing to the tooth without significant benefit. He is dosed his regular pain medicine which includesoxycodone for his chronic back pain. He reports intermittent sweating, nausea, and vomiting.  Past Medical History  Diagnosis Date  . Hypertension   . Allergy     Patient Active Problem List   Diagnosis Date Noted  . Cervical spinal stenosis (C4-5) 01/06/2015  . Cervical foraminal stenosis (Bilateral C5-6) 01/06/2015  . Chronic pain 01/05/2015  . Chronic low back pain 01/05/2015  . Lumbar spondylosis 01/05/2015  . Chronic radicular lumbar pain (left) (S1 dermatomal) 01/05/2015  . Failed back surgical syndrome 01/05/2015  . Chronic neck pain 01/05/2015  . Cervical spondylosis 01/05/2015  . Chronic cervical radicular pain (left side) (C5/C6 dermatome) 01/05/2015  . Long term current use of opiate analgesic 01/05/2015  . Long term prescription opiate use 01/05/2015  . Opiate use 01/05/2015  . Opioid dependence ([place]) 01/05/2015  . Encounter for therapeutic drug level monitoring 01/05/2015    Past Surgical History  Procedure Laterality Date  . Back surgery    . Appendectomy    . Leg surgery      Current Outpatient Rx  Name  Route  Sig  Dispense  Refill  . amLODipine (NORVASC) 10 MG tablet   Oral   Take 1 tablet (10 mg  total) by mouth daily.   30 tablet   0   . clindamycin (CLEOCIN) 300 MG capsule   Oral   Take 1 capsule (300 mg total) by mouth 4 (four) times daily.   40 capsule   0   . ketorolac (TORADOL) 10 MG tablet   Oral   Take 1 tablet (10 mg total) by mouth every 8 (eight) hours.   15 tablet   0   . Oxycodone HCl 10 MG TABS   Oral   Take 1 tablet (10 mg total) by mouth every 6 (six) hours as needed.   120 tablet   0     Do not place this medication, or any other prescri ...   . SUMAtriptan (IMITREX) 50 MG tablet   Oral   Take 50 mg by mouth every 2 (two) hours as needed for migraine. May repeat in 2 hours if headache persists or recurs.          Allergies Lidocaine and Penicillins  Family History  Problem Relation Age of Onset  . Cancer Father     Social History Social History  Substance Use Topics  . Smoking status: Current Some Day Smoker  . Smokeless tobacco: Not on file  . Alcohol Use: No   Review of Systems  Constitutional: Negative for fever. Eyes: Negative for visual changes. ENT: Negative for sore throat. Dental pain as above. Cardiovascular: Negative for chest pain. Respiratory: Negative for shortness of breath. Gastrointestinal: Negative for abdominal pain, vomiting and diarrhea. Genitourinary: Negative for  dysuria. Musculoskeletal: Negative for back pain. Skin: Negative for rash. Neurological: Negative for headaches, focal weakness or numbness. ____________________________________________  PHYSICAL EXAM:  VITAL SIGNS: ED Triage Vitals  Enc Vitals Group     BP 01/28/15 0820 130/68 mmHg     Pulse Rate 01/28/15 0820 103     Resp --      Temp 01/28/15 0820 99.2 F (37.3 C)     Temp Source 01/28/15 0820 Oral     SpO2 01/28/15 0820 96 %     Weight 01/28/15 0820 200 lb (90.719 kg)     Height 01/28/15 0820 '5\' 10"'$  (1.778 m)     Head Cir --      Peak Flow --      Pain Score 01/28/15 0818 10     Pain Loc --      Pain Edu? --      Excl. in [place]? --     Constitutional: Alert and oriented. Well appearing and in no distress. Head: Normocephalic and atraumatic. Moderate swelling to the right side of the face from the inferior or per region through the nasolabial fold.      Eyes: Conjunctivae are normal. PERRL. Normal extraocular movements      Ears: Canals clear. TMs intact bilaterally.   Nose: No congestion/rhinorrhea.   Mouth/Throat: Mucous membranes are moist. Widespread dental caries are noted. Patient with a fractured premolar on the right upper jaw. Temporary tooth packing is in place. No obvious bulging at the gumline is appreciated.   Neck: Supple. No thyromegaly. Hematological/Lymphatic/Immunological: No cervical lymphadenopathy. Cardiovascular: Normal rate, regular rhythm.  Respiratory: Normal respiratory effort. No wheezes/rales/rhonchi. Gastrointestinal: Soft and nontender. No distention. Musculoskeletal: Nontender with normal range of motion in all extremities.  Neurologic:  Normal gait without ataxia. Normal speech and language. No gross focal neurologic deficits are appreciated. Skin:  Skin is warm, dry and intact. No rash noted. Psychiatric: Mood and affect are normal. Patient exhibits appropriate insight and judgment. ____________________________________________   LABS (pertinent positives/negatives) Labs Reviewed  BASIC METABOLIC PANEL - Abnormal; Notable for the following:    Potassium 3.3 (*)    Glucose, Bld 104 (*)    All other components within normal limits  CBC WITH DIFFERENTIAL/PLATELET - Abnormal; Notable for the following:    WBC 18.9 (*)    Neutro Abs 16.8 (*)    Lymphs Abs 0.7 (*)    Monocytes Absolute 1.2 (*)    All other components within normal limits  ____________________________________________  PROCEDURES  Toradol 30 mg IVP Clindamycin 600 mg IVP NS 1000 ml IV ____________________________________________  INITIAL IMPRESSION / ASSESSMENT AND PLAN / ED COURSE  Acute dental abscess to  the right upper jaw. Patient presents today with low-grade fever, mild tachycardia, and increased pain and swelling. He is treated empirically with IV antibiotics and fluid hydration. He reports improvement to his pain at discharge. He will be discharged with prescription for clindamycin as well as Toradol to dose as directed. He'll be provided with a list of dental clinics in the area. He is encouraged to follow-up for definitive treatment of his dental caries and dental fracture. He is given instructions on return precautions. Patient verbalizes understanding. ____________________________________________  FINAL CLINICAL IMPRESSION(S) / ED DIAGNOSES  Final diagnoses:  Dental abscess  Infected dental caries      Melvenia Needles, PA-C 01/28/15 1108  Nance Pear, MD 01/28/15 1400

## 2015-02-01 ENCOUNTER — Other Ambulatory Visit: Payer: Self-pay | Admitting: Pain Medicine

## 2015-02-01 ENCOUNTER — Ambulatory Visit: Payer: Medicare HMO | Attending: Pain Medicine | Admitting: Pain Medicine

## 2015-02-01 ENCOUNTER — Encounter: Payer: Self-pay | Admitting: Pain Medicine

## 2015-02-01 VITALS — BP 137/89 | HR 71 | Temp 97.9°F | Resp 18 | Ht 70.0 in | Wt 200.0 lb

## 2015-02-01 DIAGNOSIS — M4806 Spinal stenosis, lumbar region: Secondary | ICD-10-CM | POA: Insufficient documentation

## 2015-02-01 DIAGNOSIS — M4802 Spinal stenosis, cervical region: Secondary | ICD-10-CM

## 2015-02-01 DIAGNOSIS — M545 Low back pain, unspecified: Secondary | ICD-10-CM

## 2015-02-01 DIAGNOSIS — E786 Lipoprotein deficiency: Secondary | ICD-10-CM | POA: Diagnosis not present

## 2015-02-01 DIAGNOSIS — M549 Dorsalgia, unspecified: Secondary | ICD-10-CM | POA: Insufficient documentation

## 2015-02-01 DIAGNOSIS — M539 Dorsopathy, unspecified: Secondary | ICD-10-CM

## 2015-02-01 DIAGNOSIS — M47812 Spondylosis without myelopathy or radiculopathy, cervical region: Secondary | ICD-10-CM | POA: Insufficient documentation

## 2015-02-01 DIAGNOSIS — M79606 Pain in leg, unspecified: Secondary | ICD-10-CM | POA: Insufficient documentation

## 2015-02-01 DIAGNOSIS — M5416 Radiculopathy, lumbar region: Secondary | ICD-10-CM

## 2015-02-01 DIAGNOSIS — G8929 Other chronic pain: Secondary | ICD-10-CM | POA: Diagnosis not present

## 2015-02-01 DIAGNOSIS — F1120 Opioid dependence, uncomplicated: Secondary | ICD-10-CM

## 2015-02-01 DIAGNOSIS — M47816 Spondylosis without myelopathy or radiculopathy, lumbar region: Secondary | ICD-10-CM | POA: Diagnosis not present

## 2015-02-01 DIAGNOSIS — F1190 Opioid use, unspecified, uncomplicated: Secondary | ICD-10-CM

## 2015-02-01 DIAGNOSIS — F17200 Nicotine dependence, unspecified, uncomplicated: Secondary | ICD-10-CM | POA: Insufficient documentation

## 2015-02-01 DIAGNOSIS — M542 Cervicalgia: Secondary | ICD-10-CM

## 2015-02-01 DIAGNOSIS — M4726 Other spondylosis with radiculopathy, lumbar region: Secondary | ICD-10-CM

## 2015-02-01 DIAGNOSIS — Z5181 Encounter for therapeutic drug level monitoring: Secondary | ICD-10-CM

## 2015-02-01 DIAGNOSIS — M961 Postlaminectomy syndrome, not elsewhere classified: Secondary | ICD-10-CM

## 2015-02-01 DIAGNOSIS — M5412 Radiculopathy, cervical region: Secondary | ICD-10-CM

## 2015-02-01 DIAGNOSIS — I10 Essential (primary) hypertension: Secondary | ICD-10-CM | POA: Diagnosis not present

## 2015-02-01 DIAGNOSIS — Z79899 Other long term (current) drug therapy: Secondary | ICD-10-CM | POA: Diagnosis not present

## 2015-02-01 DIAGNOSIS — Z79891 Long term (current) use of opiate analgesic: Secondary | ICD-10-CM | POA: Diagnosis not present

## 2015-02-01 DIAGNOSIS — E876 Hypokalemia: Secondary | ICD-10-CM

## 2015-02-01 MED ORDER — OXYCODONE HCL 10 MG PO TABS: 10.0000 mg | ORAL_TABLET | Freq: Four times a day (QID) | ORAL | Status: DC | PRN

## 2015-02-01 NOTE — Patient Instructions (Signed)
Smoking Cessation, Tips for Success If you are ready to quit smoking, congratulations! You have chosen to help yourself be healthier. Cigarettes bring nicotine, tar, carbon monoxide, and other irritants into your body. Your lungs, heart, and blood vessels will be able to work better without these poisons. There are many different ways to quit smoking. Nicotine gum, nicotine patches, a nicotine inhaler, or nicotine nasal spray can help with physical craving. Hypnosis, support groups, and medicines help break the habit of smoking. WHAT THINGS CAN I DO TO MAKE QUITTING EASIER?  Here are some tips to help you quit for good:  Pick a date when you will quit smoking completely. Tell all of your friends and family about your plan to quit on that date.  Do not try to slowly cut down on the number of cigarettes you are smoking. Pick a quit date and quit smoking completely starting on that day.  Throw away all cigarettes.   Clean and remove all ashtrays from your home, work, and car.  On a card, write down your reasons for quitting. Carry the card with you and read it when you get the urge to smoke.  Cleanse your body of nicotine. Drink enough water and fluids to keep your urine clear or pale yellow. Do this after quitting to flush the nicotine from your body.  Learn to predict your moods. Do not let a bad situation be your excuse to have a cigarette. Some situations in your life might tempt you into wanting a cigarette.  Never have "just one" cigarette. It leads to wanting another and another. Remind yourself of your decision to quit.  Change habits associated with smoking. If you smoked while driving or when feeling stressed, try other activities to replace smoking. Stand up when drinking your coffee. Brush your teeth after eating. Sit in a different chair when you read the paper. Avoid alcohol while trying to quit, and try to drink fewer caffeinated beverages. Alcohol and caffeine may urge you to  smoke.  Avoid foods and drinks that can trigger a desire to smoke, such as sugary or spicy foods and alcohol.  Ask people who smoke not to smoke around you.  Have something planned to do right after eating or having a cup of coffee. For example, plan to take a walk or exercise.  Try a relaxation exercise to calm you down and decrease your stress. Remember, you may be tense and nervous for the first 2 weeks after you quit, but this will pass.  Find new activities to keep your hands busy. Play with a pen, coin, or rubber band. Doodle or draw things on paper.  Brush your teeth right after eating. This will help cut down on the craving for the taste of tobacco after meals. You can also try mouthwash.   Use oral substitutes in place of cigarettes. Try using lemon drops, carrots, cinnamon sticks, or chewing gum. Keep them handy so they are available when you have the urge to smoke.  When you have the urge to smoke, try deep breathing.  Designate your home as a nonsmoking area.  If you are a heavy smoker, ask your health care provider about a prescription for nicotine chewing gum. It can ease your withdrawal from nicotine.  Reward yourself. Set aside the cigarette money you save and buy yourself something nice.  Look for support from others. Join a support group or smoking cessation program. Ask someone at home or at work to help you with your plan   to quit smoking.  Always ask yourself, "Do I need this cigarette or is this just a reflex?" Tell yourself, "Today, I choose not to smoke," or "I do not want to smoke." You are reminding yourself of your decision to quit.  Do not replace cigarette smoking with electronic cigarettes (commonly called e-cigarettes). The safety of e-cigarettes is unknown, and some may contain harmful chemicals.  If you relapse, do not give up! Plan ahead and think about what you will do the next time you get the urge to smoke. HOW WILL I FEEL WHEN I QUIT SMOKING? You  may have symptoms of withdrawal because your body is used to nicotine (the addictive substance in cigarettes). You may crave cigarettes, be irritable, feel very hungry, cough often, get headaches, or have difficulty concentrating. The withdrawal symptoms are only temporary. They are strongest when you first quit but will go away within 10-14 days. When withdrawal symptoms occur, stay in control. Think about your reasons for quitting. Remind yourself that these are signs that your body is healing and getting used to being without cigarettes. Remember that withdrawal symptoms are easier to treat than the major diseases that smoking can cause.  Even after the withdrawal is over, expect periodic urges to smoke. However, these cravings are generally short lived and will go away whether you smoke or not. Do not smoke! WHAT RESOURCES ARE AVAILABLE TO HELP ME QUIT SMOKING? Your health care provider can direct you to community resources or hospitals for support, which may include:  Group support.  Education.  Hypnosis.  Therapy.   This information is not intended to replace advice given to you by your health care provider. Make sure you discuss any questions you have with your health care provider.   Document Released: 11/17/2003 Document Revised: 03/11/2014 Document Reviewed: 08/06/2012 Elsevier Interactive Patient Education 2016 [employer]  

## 2015-02-01 NOTE — Progress Notes (Signed)
Safety precautions to be maintained throughout the outpatient stay will include: orient to surroundings, keep bed in low position, maintain call bell within reach at all times, provide assistance with transfer out of bed and ambulation. Did not bring pills for pill count.  States they fell out of his bag.

## 2015-02-06 ENCOUNTER — Encounter: Payer: Self-pay | Admitting: Pain Medicine

## 2015-02-06 ENCOUNTER — Other Ambulatory Visit: Payer: Self-pay | Admitting: Pain Medicine

## 2015-02-06 DIAGNOSIS — M4310 Spondylolisthesis, site unspecified: Secondary | ICD-10-CM | POA: Insufficient documentation

## 2015-02-06 DIAGNOSIS — M5126 Other intervertebral disc displacement, lumbar region: Secondary | ICD-10-CM | POA: Insufficient documentation

## 2015-02-06 DIAGNOSIS — M5020 Other cervical disc displacement, unspecified cervical region: Secondary | ICD-10-CM | POA: Insufficient documentation

## 2015-02-06 NOTE — Progress Notes (Signed)
Patient's Name: Kristopher [name] MRN: [id number] DOB: [birth year] DOS: 02/01/2015  Primary Reason(s) for Visit: Encounter for Medication Management CC: Back Pain and Leg Pain   HPI:   Kristopher [name] is a 54 y.o. year old, male patient, who returns today as an established patient. He has Chronic pain; Chronic low back pain; Lumbar spondylosis; Chronic radicular lumbar pain (left) (S1 dermatomal); Failed back surgical syndrome; Chronic neck pain; Cervical spondylosis; Chronic cervical radicular pain (left side) (C5/C6 dermatome); Long term current use of opiate analgesic; Long term prescription opiate use; Opiate use; Opioid dependence ([address]); Encounter for therapeutic drug level monitoring; Cervical spinal stenosis (C4-5); Cervical foraminal stenosis (Bilateral C5-6); and Hypokalemia on his problem list.. His primarily concern today is the Back Pain and Leg Pain     The patient returns today for pharmacological evaluation and management. The patient's last UDS collected on 12/05/2014 was read as abnormal and inconsistent due to the presence of hydrocodone. Today we'll talk to the patient about this and will give him a final warning. Should he again come back with an abnormal due UDS, will stop prescribing opioids.  Today's Pain Score: 7 , clinically he looks more like a 2-3/10. Reported level of pain is incompatible with clinical obrservations. This may be secondary to a possible lack of understanding on how the pain scale works. Pain Type: Chronic pain Pain Location: Back (left leg) Pain Orientation: Lower Pain Descriptors / Indicators: Burning, Stabbing, Constant Pain Frequency: Constant  Date of Last Visit: 01/05/15 Service Provided on Last Visit: Med Refill  Pharmacotherapy Review:   Side-effects or Adverse reactions: None reported. Effectiveness: Described as relatively effective, allowing for increase in activities of daily living (ADL). Onset of action: Within expected pharmacological  parameters. Duration of action: Within normal limits for medication. Peak effect: Timing and results are as within normal expected parameters. [place] PMP: PMP cannot explain where the hydrocodone came from. UDS Results: Results read as unexpected due to the presence of hydrocodone. UDS Interpretation: Noncompliant with practice rules and regulations. Medication Assessment Form: No explanation provided on form. Treatment compliance: Non-compliant. Steps taken to remind the patient of the seriousness of adequate therapy compliance. Substance Use Disorder (SUD) Risk Level: High Pharmacologic Plan: Therapy will continue on a probation all basis.  Last Available Lab Work: Admission on 01/28/2015, Discharged on 01/28/2015  Component Date Value Ref Range Status  . Sodium 01/28/2015 137  135 - 145 mmol/L Final  . Potassium 01/28/2015 3.3* 3.5 - 5.1 mmol/L Final  . Chloride 01/28/2015 106  101 - 111 mmol/L Final  . CO2 01/28/2015 22  22 - 32 mmol/L Final  . Glucose, Bld 01/28/2015 104* 65 - 99 mg/dL Final  . BUN 01/28/2015 12  6 - 20 mg/dL Final  . Creatinine, Ser 01/28/2015 1.16  0.61 - 1.24 mg/dL Final  . Calcium 01/28/2015 9.3  8.9 - 10.3 mg/dL Final  . GFR calc non Af Amer 01/28/2015 >60  >60 mL/min Final  . GFR calc Af Amer 01/28/2015 >60  >60 mL/min Final  . Anion gap 01/28/2015 9  5 - 15 Final  . WBC 01/28/2015 18.9* 3.8 - 10.6 K/uL Final  . RBC 01/28/2015 4.71  4.40 - 5.90 MIL/uL Final  . Hemoglobin 01/28/2015 13.9  13.0 - 18.0 g/dL Final  . HCT 01/28/2015 40.6  40.0 - 52.0 % Final  . MCV 01/28/2015 86.2  80.0 - 100.0 fL Final  . MCH 01/28/2015 29.5  26.0 - 34.0 pg Final  . MCHC 01/28/2015 34.3  32.0 - 36.0 g/dL Final  . RDW 01/28/2015 13.3  11.5 - 14.5 % Final  . Platelets 01/28/2015 161  150 - 440 K/uL Final  . Neutrophils Relative % 01/28/2015 90   Final  . Neutro Abs 01/28/2015 16.8* 1.4 - 6.5 K/uL Final  . Lymphocytes Relative 01/28/2015 4   Final  . Lymphs Abs 01/28/2015 0.7*  1.0 - 3.6 K/uL Final  . Monocytes Relative 01/28/2015 6   Final  . Monocytes Absolute 01/28/2015 1.2* 0.2 - 1.0 K/uL Final  . Eosinophils Relative 01/28/2015 0   Final  . Eosinophils Absolute 01/28/2015 0.0  0 - 0.7 K/uL Final  . Basophils Relative 01/28/2015 0   Final  . Basophils Absolute 01/28/2015 0.1  0 - 0.1 K/uL Final  Orders Only on 01/05/2015  Component Date Value Ref Range Status  . Report Summary 01/05/2015 FINAL   Final  . PDF 01/05/2015 .   Final   Allergies: Kristopher [name] is allergic to lidocaine and penicillins.  Meds: The patient has a current medication list which includes the following prescription(s): amlodipine, clindamycin, ketorolac, oxycodone hcl, sumatriptan, and oxycodone hcl. Requested Prescriptions   Signed Prescriptions Disp Refills  . Oxycodone HCl 10 MG TABS 120 tablet 0    Sig: Take 1 tablet (10 mg total) by mouth every 6 (six) hours as needed.  . Oxycodone HCl 10 MG TABS 120 tablet 0    Sig: Take 1 tablet (10 mg total) by mouth every 6 (six) hours as needed.    ROS: Constitutional: Afebrile, no chills, well hydrated and well nourished Gastrointestinal: negative Musculoskeletal:negative Neurological: negative Behavioral/Psych: negative  PFSH: Medical:  Kristopher [name]  has a past medical history of Hypertension and Allergy. Family: family history includes Cancer in his father. Surgical:  has past surgical history that includes Back surgery; Appendectomy; and Leg Surgery. Tobacco:  reports that he has been smoking.  He does not have any smokeless tobacco history on file. Alcohol:  reports that he does not drink alcohol. Drug:  has no drug history on file.  Physical Exam: Vitals:  Today's Vitals   02/01/15 1059 02/01/15 1100  BP:  137/89  Pulse: 71   Temp: 97.9 F (36.6 C)   Resp: 18   Height: '5\' 10"'$  (1.778 m)   Weight: 200 lb (90.719 kg)   SpO2: 100%   PainSc: 7  7   PainLoc: Back   Calculated BMI: Body mass index is 28.7  kg/(m^2). General appearance: alert, cooperative, appears stated age and no distress Eyes: conjunctivae/corneas clear. PERRL, EOM's intact. Fundi benign. Lungs: No evidence respiratory distress, no audible rales or ronchi and no use of accessory muscles of respiration Neck: no adenopathy, no carotid bruit, no JVD, supple, symmetrical, trachea midline and thyroid not enlarged, symmetric, no tenderness/mass/nodules Lumbar Spine ROM: Non-contributory Palpable Trigger Points: Negative bilaterally Lumbar Hyperextension and rotation: Non-contributory Patrick's Maneuver: Non-contributory Lower Extremities ROM: Adequate PT & DP Pulses: No significant anomalies detected Skin: Skin color, texture, turgor normal. No rashes or lesions Neurologic: Grossly normal    Assessment: Encounter Diagnosis:  Primary Diagnosis: Chronic pain [G89.29]  Plan: Nashawn was seen today for back pain and leg pain.  Diagnoses and all orders for this visit:  Chronic pain -     Oxycodone HCl 10 MG TABS; Take 1 tablet (10 mg total) by mouth every 6 (six) hours as needed. -     Oxycodone HCl 10 MG TABS; Take 1 tablet (10 mg total) by mouth every 6 (six) hours  as needed. -     COMPLETE METABOLIC PANEL WITH GFR; Future -     C-reactive protein; Future -     Magnesium; Future -     Sedimentation rate; Future -     Vitamin D2,D3 Panel; Future  Encounter for therapeutic drug level monitoring  Long term current use of opiate analgesic -     Drugs of abuse screen w/o alc, rtn urine-sln  Long term prescription opiate use  Opiate use  Uncomplicated opioid dependence ([facility])  Chronic low back pain  Cervical foraminal stenosis (Bilateral C5-6)  Cervical spinal stenosis (C4-5)  Cervical spondylosis  Chronic cervical radicular pain (left side) (C5/C6 dermatome)  Chronic neck pain  Chronic radicular lumbar pain (left) (S1 dermatomal)  Failed back surgical syndrome  Osteoarthritis of spine with  radiculopathy, lumbar region  Hypokalemia     Patient Instructions  Smoking Cessation, Tips for Success If you are ready to quit smoking, congratulations! You have chosen to help yourself be healthier. Cigarettes bring nicotine, tar, carbon monoxide, and other irritants into your body. Your lungs, heart, and blood vessels will be able to work better without these poisons. There are many different ways to quit smoking. Nicotine gum, nicotine patches, a nicotine inhaler, or nicotine nasal spray can help with physical craving. Hypnosis, support groups, and medicines help break the habit of smoking. WHAT THINGS CAN I DO TO MAKE QUITTING EASIER?  Here are some tips to help you quit for good:  Pick a date when you will quit smoking completely. Tell all of your friends and family about your plan to quit on that date.  Do not try to slowly cut down on the number of cigarettes you are smoking. Pick a quit date and quit smoking completely starting on that day.  Throw away all cigarettes.   Clean and remove all ashtrays from your home, work, and car.  On a card, write down your reasons for quitting. Carry the card with you and read it when you get the urge to smoke.  Cleanse your body of nicotine. Drink enough water and fluids to keep your urine clear or pale yellow. Do this after quitting to flush the nicotine from your body.  Learn to predict your moods. Do not let a bad situation be your excuse to have a cigarette. Some situations in your life might tempt you into wanting a cigarette.  Never have "just one" cigarette. It leads to wanting another and another. Remind yourself of your decision to quit.  Change habits associated with smoking. If you smoked while driving or when feeling stressed, try other activities to replace smoking. Stand up when drinking your coffee. Brush your teeth after eating. Sit in a different chair when you read the paper. Avoid alcohol while trying to quit, and try to  drink fewer caffeinated beverages. Alcohol and caffeine may urge you to smoke.  Avoid foods and drinks that can trigger a desire to smoke, such as sugary or spicy foods and alcohol.  Ask people who smoke not to smoke around you.  Have something planned to do right after eating or having a cup of coffee. For example, plan to take a walk or exercise.  Try a relaxation exercise to calm you down and decrease your stress. Remember, you may be tense and nervous for the first 2 weeks after you quit, but this will pass.  Find new activities to keep your hands busy. Play with a pen, coin, or rubber band. Doodle or draw  things on paper.  Brush your teeth right after eating. This will help cut down on the craving for the taste of tobacco after meals. You can also try mouthwash.   Use oral substitutes in place of cigarettes. Try using lemon drops, carrots, cinnamon sticks, or chewing gum. Keep them handy so they are available when you have the urge to smoke.  When you have the urge to smoke, try deep breathing.  Designate your home as a nonsmoking area.  If you are a heavy smoker, ask your health care provider about a prescription for nicotine chewing gum. It can ease your withdrawal from nicotine.  Reward yourself. Set aside the cigarette money you save and buy yourself something nice.  Look for support from others. Join a support group or smoking cessation program. Ask someone at home or at work to help you with your plan to quit smoking.  Always ask yourself, "Do I need this cigarette or is this just a reflex?" Tell yourself, "Today, I choose not to smoke," or "I do not want to smoke." You are reminding yourself of your decision to quit.  Do not replace cigarette smoking with electronic cigarettes (commonly called e-cigarettes). The safety of e-cigarettes is unknown, and some may contain harmful chemicals.  If you relapse, do not give up! Plan ahead and think about what you will do the next  time you get the urge to smoke. HOW WILL I FEEL WHEN I QUIT SMOKING? You may have symptoms of withdrawal because your body is used to nicotine (the addictive substance in cigarettes). You may crave cigarettes, be irritable, feel very hungry, cough often, get headaches, or have difficulty concentrating. The withdrawal symptoms are only temporary. They are strongest when you first quit but will go away within 10-14 days. When withdrawal symptoms occur, stay in control. Think about your reasons for quitting. Remind yourself that these are signs that your body is healing and getting used to being without cigarettes. Remember that withdrawal symptoms are easier to treat than the major diseases that smoking can cause.  Even after the withdrawal is over, expect periodic urges to smoke. However, these cravings are generally short lived and will go away whether you smoke or not. Do not smoke! WHAT RESOURCES ARE AVAILABLE TO HELP ME QUIT SMOKING? Your health care provider can direct you to community resources or hospitals for support, which may include:  Group support.  Education.  Hypnosis.  Therapy.   This information is not intended to replace advice given to you by your health care provider. Make sure you discuss any questions you have with your health care provider.   Document Released: 11/17/2003 Document Revised: 03/11/2014 Document Reviewed: 08/06/2012 Elsevier Interactive Patient Education 2016 [employer].   Medications discontinued today:  Medications Discontinued During This Encounter  Medication Reason  . Oxycodone HCl 10 MG TABS Reorder   Medications administered today:  Mr. Deloria had no medications administered during this visit.  Primary Care Physician: Freddy Finner, NP Location: [facility] Note by: Beatriz Chancellor A. Dossie Arbour, M.D, DABA, DABAPM, DABPM, DABIPP, FIPP

## 2015-02-07 ENCOUNTER — Other Ambulatory Visit: Payer: Self-pay | Admitting: Pain Medicine

## 2015-02-07 NOTE — Progress Notes (Signed)
Quick Note:  The findings of this UDT were reported as abnormal due to inconsistencies with expected results. An unreported substance was identified in the sample. Expectations were based on the medication history provided by the patient at the time of sample collection. Upon return, these results will be shared with the client in an effort to clarify them before making any changes in the clinical management of the medications. Unreported hydrocodone found on the UDS. ______

## 2015-02-09 LAB — TOXASSURE SELECT 13 (MW), URINE: PDF: 0

## 2015-02-17 ENCOUNTER — Other Ambulatory Visit: Payer: Self-pay

## 2015-03-31 ENCOUNTER — Other Ambulatory Visit: Payer: Self-pay

## 2015-04-03 ENCOUNTER — Other Ambulatory Visit: Payer: Self-pay | Admitting: Pain Medicine

## 2015-04-03 ENCOUNTER — Ambulatory Visit: Payer: Medicare Other | Attending: Pain Medicine | Admitting: Pain Medicine

## 2015-04-03 ENCOUNTER — Encounter: Payer: Self-pay | Admitting: Pain Medicine

## 2015-04-03 VITALS — BP 127/91 | HR 82 | Temp 98.0°F | Resp 18 | Ht 70.0 in | Wt 210.0 lb

## 2015-04-03 DIAGNOSIS — F1120 Opioid dependence, uncomplicated: Secondary | ICD-10-CM | POA: Diagnosis not present

## 2015-04-03 DIAGNOSIS — M4316 Spondylolisthesis, lumbar region: Secondary | ICD-10-CM | POA: Insufficient documentation

## 2015-04-03 DIAGNOSIS — Z79891 Long term (current) use of opiate analgesic: Secondary | ICD-10-CM

## 2015-04-03 DIAGNOSIS — M542 Cervicalgia: Secondary | ICD-10-CM | POA: Diagnosis present

## 2015-04-03 DIAGNOSIS — M47816 Spondylosis without myelopathy or radiculopathy, lumbar region: Secondary | ICD-10-CM | POA: Insufficient documentation

## 2015-04-03 DIAGNOSIS — K047 Periapical abscess without sinus: Secondary | ICD-10-CM | POA: Diagnosis not present

## 2015-04-03 DIAGNOSIS — F17210 Nicotine dependence, cigarettes, uncomplicated: Secondary | ICD-10-CM | POA: Insufficient documentation

## 2015-04-03 DIAGNOSIS — I10 Essential (primary) hypertension: Secondary | ICD-10-CM | POA: Insufficient documentation

## 2015-04-03 DIAGNOSIS — M539 Dorsopathy, unspecified: Secondary | ICD-10-CM | POA: Diagnosis not present

## 2015-04-03 DIAGNOSIS — E876 Hypokalemia: Secondary | ICD-10-CM | POA: Insufficient documentation

## 2015-04-03 DIAGNOSIS — M549 Dorsalgia, unspecified: Secondary | ICD-10-CM | POA: Diagnosis present

## 2015-04-03 DIAGNOSIS — M545 Low back pain, unspecified: Secondary | ICD-10-CM

## 2015-04-03 DIAGNOSIS — G8929 Other chronic pain: Secondary | ICD-10-CM | POA: Diagnosis not present

## 2015-04-03 DIAGNOSIS — M5126 Other intervertebral disc displacement, lumbar region: Secondary | ICD-10-CM | POA: Insufficient documentation

## 2015-04-03 DIAGNOSIS — M4802 Spinal stenosis, cervical region: Secondary | ICD-10-CM | POA: Insufficient documentation

## 2015-04-03 DIAGNOSIS — M961 Postlaminectomy syndrome, not elsewhere classified: Secondary | ICD-10-CM

## 2015-04-03 DIAGNOSIS — Z5181 Encounter for therapeutic drug level monitoring: Secondary | ICD-10-CM | POA: Diagnosis not present

## 2015-04-03 DIAGNOSIS — Z79899 Other long term (current) drug therapy: Secondary | ICD-10-CM | POA: Diagnosis not present

## 2015-04-03 DIAGNOSIS — Z9889 Other specified postprocedural states: Secondary | ICD-10-CM | POA: Diagnosis not present

## 2015-04-03 LAB — COMPREHENSIVE METABOLIC PANEL
ALT: 77 U/L — ABNORMAL HIGH (ref 17–63)
AST: 46 U/L — ABNORMAL HIGH (ref 15–41)
Albumin: 4.6 g/dL (ref 3.5–5.0)
Alkaline Phosphatase: 109 U/L (ref 38–126)
Anion gap: 9 (ref 5–15)
BUN: 12 mg/dL (ref 6–20)
CO2: 24 mmol/L (ref 22–32)
Calcium: 9.4 mg/dL (ref 8.9–10.3)
Chloride: 106 mmol/L (ref 101–111)
Creatinine, Ser: 0.83 mg/dL (ref 0.61–1.24)
GFR calc Af Amer: >60 mL/min (ref >60)
GFR calc non Af Amer: >60 mL/min (ref >60)
Glucose, Bld: 104 mg/dL — ABNORMAL HIGH (ref 65–99)
Potassium: 4 mmol/L (ref 3.5–5.1)
Sodium: 139 mmol/L (ref 135–145)
Total Bilirubin: 0.5 mg/dL (ref 0.3–1.2)
Total Protein: 8 g/dL (ref 6.5–8.1)

## 2015-04-03 LAB — C-REACTIVE PROTEIN: CRP: 0.7 mg/dL (ref <1.0)

## 2015-04-03 LAB — MAGNESIUM: Magnesium: 2 mg/dL (ref 1.7–2.4)

## 2015-04-03 LAB — SEDIMENTATION RATE: Sed Rate: 11 mm/hr (ref 0–20)

## 2015-04-03 MED ORDER — OXYCODONE HCL 10 MG PO TABS: 10.0000 mg | ORAL_TABLET | Freq: Four times a day (QID) | ORAL | Status: DC | PRN

## 2015-04-03 NOTE — Patient Instructions (Addendum)
BMI for Adults Body mass index (BMI) is a number that is calculated from a person's weight and height. In most adults, the number is used to find how much of an adult's weight is made up of fat. BMI is not as accurate as a direct measure of body fat. HOW IS BMI CALCULATED? BMI is calculated by dividing weight in kilograms by height in meters squared. It can also be calculated by dividing weight in pounds by height in inches squared, then multiplying the resulting number by 703. Charts are available to help you find your BMI quickly and easily without doing this calculation.  HOW IS BMI INTERPRETED? Health care professionals use BMI charts to identify whether an adult is underweight, at a normal weight, or overweight based on the following guidelines:  Underweight: BMI less than 18.5.  Normal weight: BMI between 18.5 and 24.9.  Overweight: BMI between 25 and 29.9.  Obese: BMI of 30 and above. BMI is usually interpreted the same for males and females. Weight includes both fat and muscle, so someone with a muscular build, such as an athlete, may have a BMI that is higher than 24.9. In cases like these, BMI may not accurately depict body fat. To determine if excess body fat is the cause of a BMI of 25 or higher, further assessments may need to be done by a health care provider. WHY IS BMI A USEFUL TOOL? BMI is used to identify a possible weight problem that may be related to a medical problem or may increase the risk for medical problems. BMI can also be used to promote changes to reach a healthy weight.   This information is not intended to replace advice given to you by your health care provider. Make sure you discuss any questions you have with your health care provider.   Document Released: 10/31/2003 Document Revised: 03/11/2014 Document Reviewed: 07/16/2013 Elsevier Interactive Patient Education 2016 [employer]. Exercising to [employer] Exercising can help you to lose weight. In order  to lose weight through exercise, you need to do vigorous-intensity exercise. You can tell that you are exercising with vigorous intensity if you are breathing very hard and fast and cannot hold a conversation while exercising. Moderate-intensity exercise helps to maintain your current weight. You can tell that you are exercising at a moderate level if you have a higher heart rate and faster breathing, but you are still able to hold a conversation. HOW OFTEN SHOULD I EXERCISE? Choose an activity that you enjoy and set realistic goals. Your health care provider can help you to make an activity plan that works for you. Exercise regularly as directed by your health care provider. This may include:  Doing resistance training twice each week, such as:  Push-ups.  Sit-ups.  Lifting weights.  Using resistance bands.  Doing a given intensity of exercise for a given amount of time. Choose from these options:  150 minutes of moderate-intensity exercise every week.  75 minutes of vigorous-intensity exercise every week.  A mix of moderate-intensity and vigorous-intensity exercise every week. Children, pregnant women, people who are out of shape, people who are overweight, and older adults may need to consult a health care provider for individual recommendations. If you have any sort of medical condition, be sure to consult your health care provider before starting a new exercise program. WHAT ARE SOME ACTIVITIES THAT CAN HELP ME TO LOSE WEIGHT?   Walking at a rate of at least 4.5 miles an hour.  Jogging or  running at a rate of 5 miles per hour.  Biking at a rate of at least 10 miles per hour.  Lap swimming.  Roller-skating or in-line skating.  Cross-country skiing.  Vigorous competitive sports, such as football, basketball, and soccer.  Jumping rope.  Aerobic dancing. HOW CAN I BE MORE ACTIVE IN MY DAY-TO-DAY ACTIVITIES?  Use the stairs instead of the elevator.  Take a walk during  your lunch break.  If you drive, park your car farther away from work or school.  If you take public transportation, get off one stop early and walk the rest of the way.  Make all of your phone calls while standing up and walking around.  Get up, stretch, and walk around every 30 minutes throughout the day. WHAT GUIDELINES SHOULD I FOLLOW WHILE EXERCISING?  Do not exercise so much that you hurt yourself, feel dizzy, or get very short of breath.  Consult your health care provider prior to starting a new exercise program.  Wear comfortable clothes and shoes with good support.  Drink plenty of water while you exercise to prevent dehydration or heat stroke. Body water is lost during exercise and must be replaced.  Work out until you breathe faster and your heart beats faster.   This information is not intended to replace advice given to you by your health care provider. Make sure you discuss any questions you have with your health care provider.   Document Released: 03/23/2010 Document Revised: 03/11/2014 Document Reviewed: 07/22/2013 Elsevier Interactive Patient Education 2016 [employer]. Smoking Cessation, Tips for Success If you are ready to quit smoking, congratulations! You have chosen to help yourself be healthier. Cigarettes bring nicotine, tar, carbon monoxide, and other irritants into your body. Your lungs, heart, and blood vessels will be able to work better without these poisons. There are many different ways to quit smoking. Nicotine gum, nicotine patches, a nicotine inhaler, or nicotine nasal spray can help with physical craving. Hypnosis, support groups, and medicines help break the habit of smoking. WHAT THINGS CAN I DO TO MAKE QUITTING EASIER?  Here are some tips to help you quit for good:  Pick a date when you will quit smoking completely. Tell all of your friends and family about your plan to quit on that date.  Do not try to slowly cut down on the number of  cigarettes you are smoking. Pick a quit date and quit smoking completely starting on that day.  Throw away all cigarettes.   Clean and remove all ashtrays from your home, work, and car.  On a card, write down your reasons for quitting. Carry the card with you and read it when you get the urge to smoke.  Cleanse your body of nicotine. Drink enough water and fluids to keep your urine clear or pale yellow. Do this after quitting to flush the nicotine from your body.  Learn to predict your moods. Do not let a bad situation be your excuse to have a cigarette. Some situations in your life might tempt you into wanting a cigarette.  Never have "just one" cigarette. It leads to wanting another and another. Remind yourself of your decision to quit.  Change habits associated with smoking. If you smoked while driving or when feeling stressed, try other activities to replace smoking. Stand up when drinking your coffee. Brush your teeth after eating. Sit in a different chair when you read the paper. Avoid alcohol while trying to quit, and try to drink fewer caffeinated beverages.  Alcohol and caffeine may urge you to smoke.  Avoid foods and drinks that can trigger a desire to smoke, such as sugary or spicy foods and alcohol.  Ask people who smoke not to smoke around you.  Have something planned to do right after eating or having a cup of coffee. For example, plan to take a walk or exercise.  Try a relaxation exercise to calm you down and decrease your stress. Remember, you may be tense and nervous for the first 2 weeks after you quit, but this will pass.  Find new activities to keep your hands busy. Play with a pen, coin, or rubber band. Doodle or draw things on paper.  Brush your teeth right after eating. This will help cut down on the craving for the taste of tobacco after meals. You can also try mouthwash.   Use oral substitutes in place of cigarettes. Try using lemon drops, carrots, cinnamon  sticks, or chewing gum. Keep them handy so they are available when you have the urge to smoke.  When you have the urge to smoke, try deep breathing.  Designate your home as a nonsmoking area.  If you are a heavy smoker, ask your health care provider about a prescription for nicotine chewing gum. It can ease your withdrawal from nicotine.  Reward yourself. Set aside the cigarette money you save and buy yourself something nice.  Look for support from others. Join a support group or smoking cessation program. Ask someone at home or at work to help you with your plan to quit smoking.  Always ask yourself, "Do I need this cigarette or is this just a reflex?" Tell yourself, "Today, I choose not to smoke," or "I do not want to smoke." You are reminding yourself of your decision to quit.  Do not replace cigarette smoking with electronic cigarettes (commonly called e-cigarettes). The safety of e-cigarettes is unknown, and some may contain harmful chemicals.  If you relapse, do not give up! Plan ahead and think about what you will do the next time you get the urge to smoke. HOW WILL I FEEL WHEN I QUIT SMOKING? You may have symptoms of withdrawal because your body is used to nicotine (the addictive substance in cigarettes). You may crave cigarettes, be irritable, feel very hungry, cough often, get headaches, or have difficulty concentrating. The withdrawal symptoms are only temporary. They are strongest when you first quit but will go away within 10-14 days. When withdrawal symptoms occur, stay in control. Think about your reasons for quitting. Remind yourself that these are signs that your body is healing and getting used to being without cigarettes. Remember that withdrawal symptoms are easier to treat than the major diseases that smoking can cause.  Even after the withdrawal is over, expect periodic urges to smoke. However, these cravings are generally short lived and will go away whether you smoke or  not. Do not smoke! WHAT RESOURCES ARE AVAILABLE TO HELP ME QUIT SMOKING? Your health care provider can direct you to community resources or hospitals for support, which may include:  Group support.  Education.  Hypnosis.  Therapy.   This information is not intended to replace advice given to you by your health care provider. Make sure you discuss any questions you have with your health care provider.   Document Released: 11/17/2003 Document Revised: 03/11/2014 Document Reviewed: 08/06/2012 Elsevier Interactive Patient Education 2016 [employer]. Pain Management Discharge Instructions  General Discharge Instructions :  If you need to reach your doctor  call: Monday-Friday 8:00 am - 4:00 pm at [phone] or toll free [phone].  After clinic hours [phone] to have operator reach doctor.  Bring all of your medication bottles to all your appointments in the pain clinic.  To cancel or reschedule your appointment with Pain Management please remember to call 24 hours in advance to avoid a fee.  Refer to the educational materials which you have been given on: General Risks, I had my Procedure. Discharge Instructions, Post Sedation.  Post Procedure Instructions:  The drugs you were given will stay in your system until tomorrow, so for the next 24 hours you should not drive, make any legal decisions or drink any alcoholic beverages.  You may eat anything you prefer, but it is better to start with liquids then soups and crackers, and gradually work up to solid foods.  Please notify your doctor immediately if you have any unusual bleeding, trouble breathing or pain that is not related to your normal pain.  Depending on the type of procedure that was done, some parts of your body may feel week and/or numb.  This usually clears up by tonight or the next day.  Walk with the use of an assistive device or accompanied by an adult for the 24 hours.  You may use ice on the affected  area for the first 24 hours.  Put ice in a Ziploc bag and cover with a towel and place against area 15 minutes on 15 minutes off.  You may switch to heat after 24 hours.

## 2015-04-03 NOTE — Progress Notes (Signed)
Patient's Name: Kristopher [name] MRN: [id number] DOB: [birth year] DOS: 04/03/2015  Primary Reason(s) for Visit: Encounter for Medication Management CC: Back Pain and Neck Pain   HPI  Kristopher [name] is a 55 y.o. year old, male patient, who returns today as an established patient. He has Chronic pain; Chronic low back pain; Lumbar spondylosis; Chronic radicular lumbar pain (left) (S1 dermatomal); 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); Chronic neck pain; Cervical spondylosis; Chronic cervical radicular pain (left side) (C5/C6 dermatome); Long term current use of opiate analgesic; Long term prescription opiate use; Opiate use; Opioid dependence ([place]); Encounter for therapeutic drug level monitoring; Cervical spinal stenosis (C4-5); Cervical foraminal stenosis (Bilateral C5-6); Hypokalemia; Retrolisthesis of L5-S1; Cervical disc herniation (C4-5 and C5-6); and Lumbar disc herniation (L5-S1) on his problem list.. His primarily concern today is the Back Pain and Neck Pain   The patient returns to the clinics today for pharmacological management of his chronic pain. He is currently dealing with an abscessed tooth for which he is getting antibiotics. His pill count is as follows:17/120 oxycodone '10mg'$  date filled 03/06/15 pt. States that he had to take a few extra related to increased pain.   Reported Pain Score: 6 , clinically he looks like a 1-2/10. Reported level is inconsistent with clinical obrservations. Pain Location: Back Pain Orientation: Lower Pain Descriptors / Indicators: Aching, Constant, Sharp, Radiating (cold wheather effects his pain) Pain Frequency: Constant  Date of Last Visit: 02/01/15 Service Provided on Last Visit: Med Refill  Pharmacotherapy  Medication(s): The patient is currently using oxycodone IR 10 mg every 6 hours when necessary for the pain. Onset of action: Within expected pharmacological parameters Time to Peak effect: Timing and results are as within  normal expected parameters Analgesic Effect: More than 50% Activity Facilitation: Medication(s) allow patient to sit, stand, walk, and do the basic ADLs Perceived Effectiveness: Described as relatively effective, allowing for increase in activities of daily living (ADL) Side-effects or Adverse reactions: None reported Duration of action: Within normal limits for medication [place] PMP: Compliant with practice rules and regulations UDS Results: The patient's last UDS done on 02/01/2015 came back within normal limits with no unexpected results. Prior to that he had had a UDS on 12/05/2014 that was positive for an unreported hydrocodone. Because of this, his son substance use disorder was classified as moderate to high. UDS Interpretation: Patient appears to be compliant with practice rules and regulations Medication Assessment Form: Reviewed. Patient indicates being compliant with therapy Treatment compliance: Compliant Substance Use Disorder (SUD) Risk Level: Moderate Pharmacologic Plan: Continue therapy as is  Lab Work: Illicit Drugs No results found for: THCU, COCAINSCRNUR, PCPSCRNUR, MDMA, AMPHETMU, METHADONE, ETOH  Inflammation Markers No results found for: ESRSEDRATE, CRP  Renal Function Lab Results  Component Value Date   BUN 12 01/28/2015   CREATININE 1.16 01/28/2015   GFRAA >60 01/28/2015   GFRNONAA >60 01/28/2015    Hepatic Function No results found for: AST, ALT, ALBUMIN  Electrolytes Lab Results  Component Value Date   NA 137 01/28/2015   K 3.3* 01/28/2015   CL 106 01/28/2015   CALCIUM 9.3 01/28/2015    Allergies  Kristopher [name] is allergic to lidocaine; penicillins; and strawberry (diagnostic).  Meds  The patient has a current medication list which includes the following prescription(s): amlodipine, clindamycin, oxycodone hcl, oxycodone hcl, and sumatriptan.  Current Outpatient Prescriptions on File Prior to Visit  Medication Sig  . amLODipine (NORVASC) 10 MG  tablet Take 1 tablet (10 mg total) by  mouth daily.  . clindamycin (CLEOCIN) 300 MG capsule Take 1 capsule (300 mg total) by mouth 4 (four) times daily.  . SUMAtriptan (IMITREX) 50 MG tablet Take 50 mg by mouth every 2 (two) hours as needed for migraine. May repeat in 2 hours if headache persists or recurs.   No current facility-administered medications on file prior to visit.    ROS  Constitutional: Afebrile, no chills, well hydrated and well nourished Gastrointestinal: negative Musculoskeletal:negative Neurological: negative Behavioral/Psych: negative  PFSH  Medical:  Kristopher [name]  has a past medical history of Hypertension and Allergy. Family: family history includes Cancer in his father. Surgical:  has past surgical history that includes Back surgery; Appendectomy; and Leg Surgery. Tobacco:  reports that he has been smoking Cigarettes.  He has a 17.5 pack-year smoking history. He does not have any smokeless tobacco history on file. Alcohol:  reports that he does not drink alcohol. Drug:  reports that he does not use illicit drugs.  Physical Exam  Vitals:  Today's Vitals   04/03/15 1046 04/03/15 1049  BP: 127/91   Pulse: 82   Temp: 98 F (36.7 C)   TempSrc: Oral   Resp: 18   Height: '5\' 10"'$  (1.778 m)   Weight: 210 lb (95.255 kg)   SpO2: 99%   PainSc:  6     Calculated BMI: Body mass index is 30.13 kg/(m^2).  General appearance: alert, cooperative, appears stated age and no distress Eyes: PERLA Respiratory: No evidence respiratory distress, no audible rales or ronchi and no use of accessory muscles of respiration  Cervical Spine Inspection: Normal anatomy Alignment: Symetrical ROM: Adequate  Upper Extremities Inspection: No gross anomalies detected ROM: Adequate Sensory: Normal Motor: Unremarkable  Thoracic Spine Inspection: No gross anomalies detected Alignment: Symetrical ROM: Adequate Palpation: WNL  Lumbar Spine Inspection: No gross anomalies  detected Alignment: Symetrical ROM: Adequate Palpation: WNL Provocative Tests:  Lumbar Hyperextension and rotation test:  deferred Patrick's Maneuver: deferred Gait: WNL  Lower Extremities Inspection: No gross anomalies detected ROM: Adequate Sensory:  Normal Motor: Unremarkable  Toe walk (S1): WNL  Heal walk (L5): WNL Pulses: Palpable  Assessment & Plan  Primary Diagnosis & Pertinent Problem List: The primary encounter diagnosis was Chronic pain. Diagnoses of Encounter for therapeutic drug level monitoring, Long term current use of opiate analgesic, 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), and Chronic low back pain were also pertinent to this visit.  Visit Diagnosis: 1. Chronic pain   2. Encounter for therapeutic drug level monitoring   3. Long term current use of opiate analgesic   4. 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)   5. Chronic low back pain     Assessment: No problem-specific assessment & plan notes found for this encounter.   Plan of Care  Pharmacotherapy (Medications Ordered): Meds ordered this encounter  Medications  . Oxycodone HCl 10 MG TABS    Sig: Take 1 tablet (10 mg total) by mouth every 6 (six) hours as needed.    Dispense:  120 tablet    Refill:  0    Do not place this medication, or any other prescription from our practice, on "Automatic Refill". Patient may have prescription filled one day early if pharmacy is closed on scheduled refill date. Do not fill until: 04/05/15 To last until: 05/05/15  . Oxycodone HCl 10 MG TABS    Sig: Take 1 tablet (10 mg total) by mouth every 6 (six) hours as needed.    Dispense:  120 tablet    Refill:  0    Do not place this medication, or any other prescription from our practice, on "Automatic Refill". Patient may have prescription filled one day early if pharmacy is closed on scheduled refill date. Do not fill until: 05/05/15 To last until: 06/04/15     [facility] & Procedure Ordered: Orders Placed This Encounter  Procedures  . Drugs of abuse screen w/o alc, rtn urine-sln    Volume: 10 ml(s). Minimum 3 ml of urine is needed. Document temperature of fresh sample. Indications: Long term (current) use of opiate analgesic (Z79.891) Test#: [id number] ([employer] Select-13)  . Comprehensive metabolic panel    Order Specific Question:  Has the patient fasted?    Answer:  No  . C-reactive protein  . Magnesium  . Sedimentation rate  . Vitamin B12    Indication: Bone Pain (M89.9)  . Vitamin D pnl(25-hydrxy+1,25-dihy)-bld    Imaging Ordered: None  Interventional Therapies: Scheduled: None at this time PRN Procedures: None at this time    Referral(s) or Consult(s): None at this time  Medications administered during this visit: Mr. Spivack had no medications administered during this visit.  No future appointments.  Primary Care Physician: Freddy Finner, NP Location: [facility] Facility Note by: Beatriz Chancellor A. Dossie Arbour, M.D, DABA, DABAPM, DABPM, DABIPP, FIPP

## 2015-04-03 NOTE — Progress Notes (Signed)
Safety precautions to be maintained throughout the outpatient stay will include: orient to surroundings, keep bed in low position, maintain call bell within reach at all times, provide assistance with transfer out of bed and ambulation.  pills remaining 17/120 oxycodone '10mg'$  date filled 03/06/15 pt States that he had to take a few extra related to increased pain Pt was currently taking Toradol for abcess tooth Pt here for med refill

## 2015-04-04 NOTE — Progress Notes (Signed)
Quick Note:  Lab results reviewed and found to be normal. ______

## 2015-04-04 NOTE — Progress Notes (Signed)
Quick Note:  Normal fasting (NPO x 8 hours) glucose levels are between 65-99 mg/dl, with 2 hour fasting, levels are usually less than 140 mg/dl. Any random blood glucose level greater than 200 mg/dl is considered to be Diabetes. The normal range for ALT (SGPT) values is about 7 to 56 units per liter. Muscle injections and/or strenuous exercise, may increase alanine aminotransferase (ALT) levels. Many drugs may raise levels by causing liver damage. Other causes of moderate increases include bile duct obstruction, cirrhosis, heart damage, alcohol abuse, and liver tumors. In most types of liver diseases, the ALT level is higher than AST, leading to a low AST/ALT ratio( >1). Exceptions include alcoholic or acute hepatitis, cirrhosis, as well as heart and/or muscle injury. Levels (>10 X normal) may be seen with acute hepatitis while results (sometimes >100 X normal) may indicate liver exposure to toxic substances.Normal levels of AST are between 5 and 40 U/L. Pregnancy, a muscle injection, or even strenuous exercise may increase AST levels. Acute burns, surgery, and seizures may raise AST levels as well. Very high levels of AST (> 10 X normal) are usually due to acute hepatitis. Levels > 100 X normal can be seen with liver exposure to hepatotoxic substances. Moderate increases may be seen in other diseases of the liver, especially when the bile ducts are blocked, or with cirrhosis or certain cancers of the liver. AST may also increase after heart attacks and with muscle injury, usually to a much greater degree than ALT. In most types of liver disease, the ALT level is higher than AST and the AST/ALT ratio will be low (less than 1). With heart or muscle injury, AST is often much higher than ALT (often 3-5 times as high) and levels tend to stay higher than ALT for longer than with liver injury.  ______

## 2015-04-08 LAB — TOXASSURE SELECT 13 (MW), URINE: PDF: 0

## 2015-05-16 ENCOUNTER — Other Ambulatory Visit: Payer: Self-pay

## 2015-05-31 ENCOUNTER — Ambulatory Visit: Payer: Medicare Other | Attending: Pain Medicine | Admitting: Pain Medicine

## 2015-05-31 ENCOUNTER — Encounter: Payer: Self-pay | Admitting: Pain Medicine

## 2015-05-31 VITALS — BP 152/92 | HR 82 | Temp 98.4°F | Resp 16 | Ht 70.0 in | Wt 220.0 lb

## 2015-05-31 DIAGNOSIS — M5481 Occipital neuralgia: Secondary | ICD-10-CM | POA: Diagnosis not present

## 2015-05-31 DIAGNOSIS — M79605 Pain in left leg: Secondary | ICD-10-CM

## 2015-05-31 DIAGNOSIS — Z884 Allergy status to anesthetic agent status: Secondary | ICD-10-CM | POA: Insufficient documentation

## 2015-05-31 DIAGNOSIS — F419 Anxiety disorder, unspecified: Secondary | ICD-10-CM | POA: Diagnosis not present

## 2015-05-31 DIAGNOSIS — R000 Tachycardia, unspecified: Secondary | ICD-10-CM | POA: Insufficient documentation

## 2015-05-31 DIAGNOSIS — M79601 Pain in right arm: Secondary | ICD-10-CM

## 2015-05-31 DIAGNOSIS — Z79891 Long term (current) use of opiate analgesic: Secondary | ICD-10-CM | POA: Insufficient documentation

## 2015-05-31 DIAGNOSIS — M545 Low back pain, unspecified: Secondary | ICD-10-CM

## 2015-05-31 DIAGNOSIS — M533 Sacrococcygeal disorders, not elsewhere classified: Secondary | ICD-10-CM | POA: Insufficient documentation

## 2015-05-31 DIAGNOSIS — F17210 Nicotine dependence, cigarettes, uncomplicated: Secondary | ICD-10-CM | POA: Diagnosis not present

## 2015-05-31 DIAGNOSIS — M4312 Spondylolisthesis, cervical region: Secondary | ICD-10-CM | POA: Diagnosis not present

## 2015-05-31 DIAGNOSIS — E876 Hypokalemia: Secondary | ICD-10-CM | POA: Diagnosis not present

## 2015-05-31 DIAGNOSIS — M79602 Pain in left arm: Secondary | ICD-10-CM | POA: Insufficient documentation

## 2015-05-31 DIAGNOSIS — R200 Anesthesia of skin: Secondary | ICD-10-CM | POA: Diagnosis not present

## 2015-05-31 DIAGNOSIS — M5416 Radiculopathy, lumbar region: Secondary | ICD-10-CM

## 2015-05-31 DIAGNOSIS — M549 Dorsalgia, unspecified: Secondary | ICD-10-CM | POA: Diagnosis present

## 2015-05-31 DIAGNOSIS — Z9889 Other specified postprocedural states: Secondary | ICD-10-CM | POA: Insufficient documentation

## 2015-05-31 DIAGNOSIS — F1190 Opioid use, unspecified, uncomplicated: Secondary | ICD-10-CM

## 2015-05-31 DIAGNOSIS — I10 Essential (primary) hypertension: Secondary | ICD-10-CM | POA: Insufficient documentation

## 2015-05-31 DIAGNOSIS — Z5181 Encounter for therapeutic drug level monitoring: Secondary | ICD-10-CM

## 2015-05-31 DIAGNOSIS — G8929 Other chronic pain: Secondary | ICD-10-CM | POA: Insufficient documentation

## 2015-05-31 DIAGNOSIS — M4316 Spondylolisthesis, lumbar region: Secondary | ICD-10-CM | POA: Diagnosis not present

## 2015-05-31 DIAGNOSIS — M5126 Other intervertebral disc displacement, lumbar region: Secondary | ICD-10-CM | POA: Insufficient documentation

## 2015-05-31 DIAGNOSIS — R51 Headache: Secondary | ICD-10-CM | POA: Diagnosis not present

## 2015-05-31 DIAGNOSIS — M79603 Pain in arm, unspecified: Secondary | ICD-10-CM

## 2015-05-31 DIAGNOSIS — M79606 Pain in leg, unspecified: Secondary | ICD-10-CM | POA: Diagnosis present

## 2015-05-31 DIAGNOSIS — M4802 Spinal stenosis, cervical region: Secondary | ICD-10-CM | POA: Diagnosis not present

## 2015-05-31 DIAGNOSIS — M25552 Pain in left hip: Secondary | ICD-10-CM

## 2015-05-31 DIAGNOSIS — M5412 Radiculopathy, cervical region: Secondary | ICD-10-CM

## 2015-05-31 DIAGNOSIS — M47816 Spondylosis without myelopathy or radiculopathy, lumbar region: Secondary | ICD-10-CM

## 2015-05-31 MED ORDER — KETOROLAC TROMETHAMINE 60 MG/2ML IM SOLN: 60.0000 mg | Freq: Once | INTRAMUSCULAR | Status: AC

## 2015-05-31 MED ORDER — NALOXONE HCL 4 MG/0.1ML NA LIQD: 1.0000 | Freq: Once | NASAL | Status: DC

## 2015-05-31 MED ORDER — OXYCODONE HCL 10 MG PO TABS: 10.0000 mg | ORAL_TABLET | Freq: Four times a day (QID) | ORAL | Status: DC | PRN

## 2015-05-31 MED ORDER — KETOROLAC TROMETHAMINE 60 MG/2ML IM SOLN
INTRAMUSCULAR | Status: AC
  Administered 2015-05-31: 60 mg via INTRAMUSCULAR
  Filled 2015-05-31: qty 2

## 2015-05-31 MED ORDER — ORPHENADRINE CITRATE 30 MG/ML IJ SOLN: 60.0000 mg | Freq: Once | INTRAMUSCULAR | Status: DC

## 2015-05-31 MED ORDER — ORPHENADRINE CITRATE 30 MG/ML IJ SOLN
INTRAMUSCULAR | Status: AC
  Administered 2015-05-31: 60 mg via INTRAMUSCULAR
  Filled 2015-05-31: qty 2

## 2015-05-31 NOTE — Assessment & Plan Note (Signed)
The patient reports having had an episode with a dentist where he was given lidocaine and he developed severe tachycardia and anxiety as well as some difficulty breathing. He indicates that he does not recall the event very clearly except for the anxiety and tachycardia. This is clearly due to intravascular uptake of a local anesthetic containing epinephrine. The only concern that I have this with the description of the breathing difficulty which he described as throat swelling. Make sure that this is not a true allergy, we will be referring the patient to an allergist to be tested for local anesthetic allergies.

## 2015-05-31 NOTE — Progress Notes (Signed)
Patient's Name: Kristopher [name] MRN: [id number] DOB: [birth year] DOS: 05/31/2015  Primary Reason(s) for Visit: Encounter for Medication Management CC: Back Pain and Leg Pain   HPI  Kristopher [name] is a 55 y.o. year old, male patient, who returns today as an established patient. 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); Chronic neck pain (posterior midline) (Bilateral) (L>R); Cervical spondylosis; Chronic cervical radicular pain (Location of Tertiary source of pain) (Bilateral) (C5/C6 dermatome) (L>R); Long term current use of opiate analgesic; Long term prescription opiate use; Opiate use (60 MME/Day); Opioid dependence ([place]); Encounter for therapeutic drug level monitoring; Cervical spinal stenosis (C4-5); Cervical foraminal stenosis (Bilateral C5-6); Hypokalemia; Retrolisthesis of L5-S1; Cervical disc herniation (C4-5 and C5-6); Lumbar disc herniation (L5-S1); Chronic sacroiliac joint pain (Bilateral) (L>R); Allergy history, anesthetic (Unconfirmed allergy to Lidocaine); Chronic hip pain (Left); Lumbar facet syndrome (Location of Primary Source of Pain) (Bilateral) (L>R); Chronic lower extremity pain (Location of Secondary source of pain) (Left); Chronic upper extremity pain (Location of Tertiary source of pain) (Bilateral) (L>R); and Greater occipital neuralgia (Right) on his problem list.. His primarily concern today is the Back Pain and Leg Pain   The patient comes into the clinic today for pharmacological management of his chronic pain. Today the patient indicates his primary pain to be that of the lower back with the left being worse than the right. His secondary pain is that of the lower extremities which is affecting only the left lower extremity. This pain is described  to go down through the lateral aspect of the leg to the knee. His third worst pain is that of the upper extremities with the left being worst on the right. In the case of the left upper extremity his primary symptom is that of pain followed by numbness and weakness that seems to be constant. The pain pattern in the left upper extremity goes down to his index, middle, and ring fingers. In the case of the right upper extremity he goes down to the middle, ring, and pinky fingers. Next he describes his neck pain. He indicates that the neck pain is worse in the posterior aspect of the neck in the midline, followed by pain going to the left side of his neck. Next is his headaches. The headaches are occipital in nature and follow the distribution of the greater occipital nerve on the right side. The patient indicates that they are associated to bilateral eye tearing when they happen.  Today's physical exam was positive for pain arising from the lumbar facet joints, bilaterally upon doing a provocative hyperextension and rotation maneuver. In addition, the patient had pain coming from both SI joints and the left hip joint on provocative Patrick maneuver. At this point, we will be scheduling the patient to return for a bilateral diagnostic sacroiliac joint block under fluoroscopic guidance and IV sedation.  Because today we stirred his pain during the physical exam, I have given him an IM injection of Toradol 60 mg and Norflex 60 mg  Pain Assessment: Self-Reported Pain Score: 5 , clinically he looks like a 2/10. Reported level is compatible with observation Pain Type: Chronic pain Pain Location: Back Pain Orientation: Lower Pain Descriptors / Indicators: Aching, Sharp, Radiating, Constant, Tingling Pain Frequency: Constant  Date of Last Visit: 04/03/15 Service Provided on Last Visit: Med Refill  Controlled  Substance Pharmacotherapy Assessment  Analgesic: Oxycodone IR 10 mg every 6 hours (40 mg/day) Pill  Count: Oxycodone pill count #23/120 Filled 05-06-15 MME/day: 60 mg/day Pharmacokinetics: Onset of action (Liberation/Absorption): Within expected pharmacological parameters Time to Peak effect (Distribution): Timing and results are as within normal expected parameters Duration of action (Metabolism/Excretion): Within normal limits for medication Pharmacodynamics: Analgesic Effect: More than 50% Activity Facilitation: Medication(s) allow patient to sit, stand, walk, and do the basic ADLs Perceived Effectiveness: Described as relatively effective, allowing for increase in activities of daily living (ADL) Side-effects or Adverse reactions: None reported Monitoring: [place] PMP: Online review of the past 66-monthperiod conducted. Compliant with practice rules and regulations UDS Results/interpretation: The patient's last UDS was done on 04/03/2015 and it came back within normal limits with no unexpected results. Medication Assessment Form: Reviewed. Patient indicates being compliant with therapy Treatment compliance: Compliant Risk Assessment: Aberrant Behavior: None observed today Substance Use Disorder (SUD) Risk Level: Moderate Opioid Risk Tool (ORT) Score:  1 Low Risk for SUD (Score <3) Depression Scale Score: PHQ-2: PHQ-2 Total Score: 0 No depression (0) PHQ-9: PHQ-9 Total Score: 0 No depression (0-4)  Pharmacologic Plan: No change in therapy, at this time   Laboratory Workup  Last ED UDS: No results found for: THCU, COCAINSCRNUR, PCPSCRNUR, MDMA, AMPHETMU, METHADONE, ETOH  Inflammation Markers Lab Results  Component Value Date   ESRSEDRATE 11 04/03/2015   CRP 0.7 04/03/2015    Renal Function Lab Results  Component Value Date   BUN 12 04/03/2015   CREATININE 0.83 04/03/2015   GFRAA >60 04/03/2015   GFRNONAA >60 04/03/2015    Hepatic Function Lab Results  Component Value Date   AST 46* 04/03/2015   ALT 77* 04/03/2015   ALBUMIN 4.6 04/03/2015    Electrolytes Lab  Results  Component Value Date   NA 139 04/03/2015   K 4.0 04/03/2015   CL 106 04/03/2015   CALCIUM 9.4 04/03/2015   MG 2.0 04/03/2015   Allergies  Mr. WRebstockis allergic to lidocaine; penicillins; and strawberry (diagnostic).  Meds  The patient has a current medication list which includes the following prescription(s): amlodipine, oxycodone hcl, oxycodone hcl, sumatriptan, and naloxone hcl, and the following Facility-Administered Medications: ketorolac and orphenadrine.  Current Outpatient Prescriptions on File Prior to Visit  Medication Sig  . amLODipine (NORVASC) 10 MG tablet Take 1 tablet (10 mg total) by mouth daily.  . SUMAtriptan (IMITREX) 50 MG tablet Take 50 mg by mouth every 2 (two) hours as needed for migraine. May repeat in 2 hours if headache persists or recurs.   No current facility-administered medications on file prior to visit.    ROS  Constitutional: Afebrile, no chills, well hydrated and well nourished Gastrointestinal: negative Musculoskeletal:negative Neurological: negative Behavioral/Psych: negative  PFSH  Medical:  Mr. WTurano has a past medical history of Hypertension and Allergy. Family: family history includes Cancer in his father. Surgical:  has past surgical history that includes Back surgery; Appendectomy; and Leg Surgery. Tobacco:  reports that he has been smoking Cigarettes.  He has a 17.5 pack-year smoking history. He does not have any smokeless tobacco history on file. Alcohol:  reports that he does not drink alcohol. Drug:  reports that he does not use illicit drugs.  Physical Exam  Vitals:  Today's Vitals   05/31/15 1044 05/31/15 1045  BP:  152/92  Pulse: 82   Temp: 98.4 F (36.9 C)   Resp: 16   Height: '5\' 10"'$  (1.778 m)   Weight:  220 lb (99.791 kg)   SpO2: 99%   PainSc: 5  5   PainLoc: Back     Calculated BMI: Body mass index is 31.57 kg/(m^2). Obese (Class I) (30-34.9 kg/m2) - 68% higher incidence of chronic pain  General  appearance: alert, cooperative, appears stated age and no distress Eyes: PERLA Respiratory: No evidence respiratory distress, no audible rales or ronchi and no use of accessory muscles of respiration  Lumbar Spine Exam  Inspection: Evidence of previous lumbar spine surgery detected. Well healed scar Lumbar Lordosis: Normal Sacral Kyphosis: Normal Alignment: Symetrical Palpation: Tender ROM:  Flexion: Adequate and non-contributory Extension: Decreased ROM Lateral Bending: Adequate and non-contributory Rotation: Adequate and non-contributory Provocative Tests:  Lumbar Hyperextension and rotation test:  Positive for facet pain, bilaterally Patrick's Maneuver: Positive for both, sacroiliac and hip joint pain. The test was positive bilaterally for both the sacroiliac joint and the hip joint.  Gait Evaluation  Gait: Antalgic (limping)  Lower Extremity Exam  Inspection: No gross anomalies detected ROM: Adequate Sensory:  Normal Motor: Unremarkable      Toe walk (S1): WNL Heal walk (L5): WNL Pulses: Palpable DTR:  Patellar (L4): WNL      Achilles (S1): WNL      Provocative Tests:  Straight Leg Raise (SLR) test: Non-contributory Angle: Non-contributory Piriformis Test:  Deferred  Assessment & Plan  Primary Diagnosis & Pertinent Problem List: The primary encounter diagnosis was Chronic pain. Diagnoses of Encounter for therapeutic drug level monitoring, Long term current use of opiate analgesic, Opiate use (60 MME/Day), Chronic sacroiliac joint pain (Bilateral), Chronic low back pain, Allergy history, anesthetic, Chronic left hip pain, Lumbar facet syndrome (Location of Primary Source of Pain) (Bilateral) (L>R), Chronic lower extremity pain (Left), Chronic lumbar radicular pain (S1 dermatomal) (Left), Chronic pain of both upper extremities, Chronic cervical radicular pain (Location of Tertiary source of pain) (Bilateral) (C5/C6 dermatome) (L>R), and Greater occipital neuralgia (Right)  were also pertinent to this visit.  Visit Diagnosis: 1. Chronic pain   2. Encounter for therapeutic drug level monitoring   3. Long term current use of opiate analgesic   4. Opiate use (60 MME/Day)   5. Chronic sacroiliac joint pain (Bilateral)   6. Chronic low back pain   7. Allergy history, anesthetic   8. Chronic left hip pain   9. Lumbar facet syndrome (Location of Primary Source of Pain) (Bilateral) (L>R)   10. Chronic lower extremity pain (Left)   11. Chronic lumbar radicular pain (S1 dermatomal) (Left)   12. Chronic pain of both upper extremities   13. Chronic cervical radicular pain (Location of Tertiary source of pain) (Bilateral) (C5/C6 dermatome) (L>R)   14. Greater occipital neuralgia (Right)     Problem-specific Plan(s): Allergy history, anesthetic (Unconfirmed allergy to Lidocaine) The patient reports having had an episode with a dentist where he was given lidocaine and he developed severe tachycardia and anxiety as well as some difficulty breathing. He indicates that he does not recall the event very clearly except for the anxiety and tachycardia. This is clearly due to intravascular uptake of a local anesthetic containing epinephrine. The only concern that I have this with the description of the breathing difficulty which he described as throat swelling. Make sure that this is not a true allergy, we will be referring the patient to an allergist to be tested for local anesthetic allergies.  Chronic lumbar radicular pain (Location of Secondary source of pain) (S1 dermatomal) (Left) Initially the pain pattern described by the patient seemed  to fit an S1 dermatomal distribution all the way down to the bottom of his foot. However, lately he describes the pain as going through the lateral aspect of the leg and stopping at the level of the knee.    Plan of Care  Pharmacotherapy (Medications Ordered): Meds ordered this encounter  Medications  . Oxycodone HCl 10 MG TABS     Sig: Take 1 tablet (10 mg total) by mouth every 6 (six) hours as needed.    Dispense:  120 tablet    Refill:  0    Do not place this medication, or any other prescription from our practice, on "Automatic Refill". Patient may have prescription filled one day early if pharmacy is closed on scheduled refill date. Do not fill until: 06/04/15 To last until: 07/04/15  . Oxycodone HCl 10 MG TABS    Sig: Take 1 tablet (10 mg total) by mouth every 6 (six) hours as needed.    Dispense:  120 tablet    Refill:  0    Do not place this medication, or any other prescription from our practice, on "Automatic Refill". Patient may have prescription filled one day early if pharmacy is closed on scheduled refill date. Do not fill until: 07/04/15 To last until: 08/03/15  . Naloxone HCl (NARCAN) 4 MG/0.1ML LIQD    Sig: Place 1 Bottle into the nose once. , then call 911, repeat if needed in other nostril with new bottle.    Dispense:  2 each    Refill:  0    Please provide the patient with clear instructions on the use of this device/medication.  . orphenadrine (NORFLEX) injection 60 mg    Sig:   . ketorolac (TORADOL) injection 60 mg    Sig:   . ketorolac (TORADOL) 60 MG/2ML injection    Sig:     TICE, KORI: cabinet override  . orphenadrine (NORFLEX) 30 MG/ML injection    Sig:     TICE, KORI: cabinet override    Lab-work & Procedure Ordered: Orders Placed This Encounter  Procedures  . SACROILIAC JOINT INJECTINS    Standing Status: Future     Number of Occurrences:      Standing Expiration Date: 05/30/2016    Scheduling Instructions:     Side: Bilateral     Sedation: With Sedation.     Timeframe: ASAP    Order Specific Question:  Where will this procedure be performed?    Answer:  [facility]  . DG Lumbar Spine Complete W/Bend    Standing Status: Future     Number of Occurrences:      Standing Expiration Date: 05/30/2016    Scheduling Instructions:     Please include flexion and  extension views and report any spinal instability (>4 mm displacement of any spondylolisthesis). If present, please report any spondylolisthesis grade, as well as displacement in millimeters.    Order Specific Question:  Reason for Exam (SYMPTOM  OR DIAGNOSIS REQUIRED)    Answer:  Low back pain    Order Specific Question:  Preferred imaging location?    Answer:  [facility]    Order Specific Question:  Call Results- Best Contact Number?    Answer:  [phone] (Pain Clinic facility) (Dr. Dossie Arbour)  . DG Si Joints    Standing Status: Future     Number of Occurrences:      Standing Expiration Date: 05/30/2016    Order Specific Question:  Reason for Exam (SYMPTOM  OR DIAGNOSIS  REQUIRED)    Answer:  Low back pain    Order Specific Question:  Preferred imaging location?    Answer:  [facility]    Order Specific Question:  Call Results- Best Contact Number?    Answer:  [phone] (Pain Clinic facility) (Dr. Dossie Arbour)  . DG HIP UNILAT W OR W/O PELVIS 2-3 VIEWS LEFT    Standing Status: Future     Number of Occurrences:      Standing Expiration Date: 05/30/2016    Order Specific Question:  Reason for Exam (SYMPTOM  OR DIAGNOSIS REQUIRED)    Answer:  Low back pain    Order Specific Question:  Preferred imaging location?    Answer:  [facility]    Order Specific Question:  Call Results- Best Contact Number?    Answer:  [phone] (Pain Clinic facility) (Dr. Dossie Arbour)  . ToxASSURE Select 13 (MW), Urine    Volume: 30 ml(s). Minimum 3 ml of urine is needed. Document temperature of fresh sample. Indications: Long term (current) use of opiate analgesic (Z79.891)  . Ambulatory referral to Allergy    Referral Priority:  Routine    Referral Type:  Allergy Testing    Referral Reason:  Specialty Services Required    Requested Specialty:  Allergy    Number of Visits Requested:  1    Imaging Ordered: AMB REFERRAL TO ALLERGY DG LUMBAR SPINE COMPLETE W/BEND 6+V DG SI  JOINTS DG HIP UNILAT W OR W/O PELVIS 2-3 VIEWS LEFT  Interventional Therapies: Scheduled: None at this time. PRN Procedures: None at this time.    Referral(s) or Consult(s): Consult to allergist to rule out a true allergy to lidocaine.  Medications administered during this visit: We administered ketorolac and orphenadrine.  Future Appointments Date Time Provider Lucas  06/06/2015 10:20 AM [name], MD [facility] None  07/24/2015 10:20 AM [name], MD [facility] None    Primary Care Physician: Freddy Finner, NP Location: [facility] Note by: Kathlen Brunswick. Dossie Arbour, M.D, DABA, DABAPM, DABPM, DABIPP, FIPP  Pain Score Disclaimer: We use the NRS-11 scale. This is a self-reported, subjective measurement of pain severity with only modest accuracy. It is used primarily to identify changes within a particular patient. It must be understood that outpatient pain scales are significantly less accurate that those used for research, where they can be applied under ideal controlled circumstances with minimal exposure to variables. In reality, the score is likely to be a combination of pain intensity and pain affect, where pain affect describes the degree of emotional arousal or changes in action readiness caused by the sensory experience of pain. Factors such as social and work situation, setting, emotional state, anxiety levels, expectation, and prior pain experience may influence pain perception and show large inter-individual differences that may also be affected by time variables.

## 2015-05-31 NOTE — Progress Notes (Signed)
Safety precautions to be maintained throughout the outpatient stay will include: orient to surroundings, keep bed in low position, maintain call bell within reach at all times, provide assistance with transfer out of bed and ambulation. Oxycodone pill count #23/120  Filled 05-06-15

## 2015-05-31 NOTE — Assessment & Plan Note (Signed)
Initially the pain pattern described by the patient seemed to fit an S1 dermatomal distribution all the way down to the bottom of his foot. However, lately he describes the pain as going through the lateral aspect of the leg and stopping at the level of the knee.

## 2015-05-31 NOTE — Patient Instructions (Addendum)
Smoking Cessation, Tips for Success If you are ready to quit smoking, congratulations! You have chosen to help yourself be healthier. Cigarettes bring nicotine, tar, carbon monoxide, and other irritants into your body. Your lungs, heart, and blood vessels will be able to work better without these poisons. There are many different ways to quit smoking. Nicotine gum, nicotine patches, a nicotine inhaler, or nicotine nasal spray can help with physical craving. Hypnosis, support groups, and medicines help break the habit of smoking. WHAT THINGS CAN I DO TO MAKE QUITTING EASIER?  Here are some tips to help you quit for good: 1. Pick a date when you will quit smoking completely. Tell all of your friends and family about your plan to quit on that date. 2. Do not try to slowly cut down on the number of cigarettes you are smoking. Pick a quit date and quit smoking completely starting on that day. 3. Throw away all cigarettes.  4. Clean and remove all ashtrays from your home, work, and car. 5. On a card, write down your reasons for quitting. Carry the card with you and read it when you get the urge to smoke. 6. Cleanse your body of nicotine. Drink enough water and fluids to keep your urine clear or pale yellow. Do this after quitting to flush the nicotine from your body. 7. Learn to predict your moods. Do not let a bad situation be your excuse to have a cigarette. Some situations in your life might tempt you into wanting a cigarette. 8. Never have "just one" cigarette. It leads to wanting another and another. Remind yourself of your decision to quit. 9. Change habits associated with smoking. If you smoked while driving or when feeling stressed, try other activities to replace smoking. Stand up when drinking your coffee. Brush your teeth after eating. Sit in a different chair when you read the paper. Avoid alcohol while trying to quit, and try to drink fewer caffeinated beverages. Alcohol and caffeine may urge you  to smoke. 10. Avoid foods and drinks that can trigger a desire to smoke, such as sugary or spicy foods and alcohol. 11. Ask people who smoke not to smoke around you. 12. Have something planned to do right after eating or having a cup of coffee. For example, plan to take a walk or exercise. 13. Try a relaxation exercise to calm you down and decrease your stress. Remember, you may be tense and nervous for the first 2 weeks after you quit, but this will pass. 14. Find new activities to keep your hands busy. Play with a pen, coin, or rubber band. Doodle or draw things on paper. 15. Brush your teeth right after eating. This will help cut down on the craving for the taste of tobacco after meals. You can also try mouthwash.  16. Use oral substitutes in place of cigarettes. Try using lemon drops, carrots, cinnamon sticks, or chewing gum. Keep them handy so they are available when you have the urge to smoke. 17. When you have the urge to smoke, try deep breathing. 18. Designate your home as a nonsmoking area. 19. If you are a heavy smoker, ask your health care provider about a prescription for nicotine chewing gum. It can ease your withdrawal from nicotine. 20. Reward yourself. Set aside the cigarette money you save and buy yourself something nice. 21. Look for support from others. Join a support group or smoking cessation program. Ask someone at home or at work to help you with your plan   to quit smoking. 22. Always ask yourself, "Do I need this cigarette or is this just a reflex?" Tell yourself, "Today, I choose not to smoke," or "I do not want to smoke." You are reminding yourself of your decision to quit. 23. Do not replace cigarette smoking with electronic cigarettes (commonly called e-cigarettes). The safety of e-cigarettes is unknown, and some may contain harmful chemicals. 24. If you relapse, do not give up! Plan ahead and think about what you will do the next time you get the urge to smoke. HOW WILL  I FEEL WHEN I QUIT SMOKING? You may have symptoms of withdrawal because your body is used to nicotine (the addictive substance in cigarettes). You may crave cigarettes, be irritable, feel very hungry, cough often, get headaches, or have difficulty concentrating. The withdrawal symptoms are only temporary. They are strongest when you first quit but will go away within 10-14 days. When withdrawal symptoms occur, stay in control. Think about your reasons for quitting. Remind yourself that these are signs that your body is healing and getting used to being without cigarettes. Remember that withdrawal symptoms are easier to treat than the major diseases that smoking can cause.  Even after the withdrawal is over, expect periodic urges to smoke. However, these cravings are generally short lived and will go away whether you smoke or not. Do not smoke! WHAT RESOURCES ARE AVAILABLE TO HELP ME QUIT SMOKING? Your health care provider can direct you to community resources or hospitals for support, which may include: 1. Group support. 2. Education. 3. Hypnosis. 4. Therapy.   This information is not intended to replace advice given to you by your health care provider. Make sure you discuss any questions you have with your health care provider.   Document Released: 11/17/2003 Document Revised: 03/11/2014 Document Reviewed: 08/06/2012 Elsevier Interactive Patient Education 2016 [place]  What are the risk, side effects and possible complications? Generally speaking, most procedures are safe.  However, with any procedure there are risks, side effects, and the possibility of complications.  The risks and complications are dependent upon the sites that are lesioned, or the type of nerve block to be performed.  The closer the procedure is to the spine, the more serious the risks are.  Great care is taken when placing the radio frequency needles, block needles or lesioning probes,  but sometimes complications can occur. 1. Infection: Any time there is an injection through the skin, there is a risk of infection.  This is why sterile conditions are used for these blocks.  There are four possible types of infection. 1. Localized skin infection. 2. Central Nervous System Infection-This can be in the form of Meningitis, which can be deadly. 3. Epidural Infections-This can be in the form of an epidural abscess, which can cause pressure inside of the spine, causing compression of the spinal cord with subsequent paralysis. This would require an emergency surgery to decompress, and there are no guarantees that the patient would recover from the paralysis. 4. Discitis-This is an infection of the intervertebral discs.  It occurs in about 1% of discography procedures.  It is difficult to treat and it may lead to surgery.        2. Pain: the needles have to go through skin and soft tissues, will cause soreness.       3. Damage to internal structures:  The nerves to be lesioned may be near blood vessels or    other nerves which can  be potentially damaged.       4. Bleeding: Bleeding is more common if the patient is taking blood thinners such as  aspirin, Coumadin, Ticiid, Plavix, etc., or if he/she have some genetic predisposition  such as hemophilia. Bleeding into the spinal canal can cause compression of the spinal  cord with subsequent paralysis.  This would require an emergency surgery to  decompress and there are no guarantees that the patient would recover from the  paralysis.       5. Pneumothorax:  Puncturing of a lung is a possibility, every time a needle is introduced in  the area of the chest or upper back.  Pneumothorax refers to free air around the  collapsed lung(s), inside of the thoracic cavity (chest cavity).  Another two possible  complications related to a similar event would include: Hemothorax and Chylothorax.   These are variations of the Pneumothorax, where instead of air  around the collapsed  lung(s), you may have blood or chyle, respectively.       6. Spinal headaches: They may occur with any procedures in the area of the spine.       7. Persistent CSF (Cerebro-Spinal Fluid) leakage: This is a rare problem, but may occur  with prolonged intrathecal or epidural catheters either due to the formation of a fistulous  track or a dural tear.       8. Nerve damage: By working so close to the spinal cord, there is always a possibility of  nerve damage, which could be as serious as a permanent spinal cord injury with  paralysis.       9. Death:  Although rare, severe deadly allergic reactions known as "Anaphylactic  reaction" can occur to any of the medications used.      10. Worsening of the symptoms:  We can always make thing worse.  What are the chances of something like this happening? Chances of any of this occuring are extremely low.  By statistics, you have more of a chance of getting killed in a motor vehicle accident: while driving to the hospital than any of the above occurring .  Nevertheless, you should be aware that they are possibilities.  In general, it is similar to taking a shower.  Everybody knows that you can slip, hit your head and get killed.  Does that mean that you should not shower again?  Nevertheless always keep in mind that statistics do not mean anything if you happen to be on the wrong side of them.  Even if a procedure has a 1 (one) in a 1,000,000 (million) chance of going wrong, it you happen to be that one..Also, keep in mind that by statistics, you have more of a chance of having something go wrong when taking medications.  Who should not have this procedure? If you are on a blood thinning medication (e.g. Coumadin, Plavix, see list of "Blood Thinners"), or if you have an active infection going on, you should not have the procedure.  If you are taking any blood thinners, please inform your physician.  How should I prepare for this  procedure?  Do not eat or drink anything at least six hours prior to the procedure.  Bring a driver with you .  It cannot be a taxi.  Come accompanied by an adult that can drive you back, and that is strong enough to help you if your legs get weak or numb from the local anesthetic.  Take all of your medicines  the morning of the procedure with just enough water to swallow them.  If you have diabetes, make sure that you are scheduled to have your procedure done first thing in the morning, whenever possible.  If you have diabetes, take only half of your insulin dose and notify our nurse that you have done so as soon as you arrive at the clinic.  If you are diabetic, but only take blood sugar pills (oral hypoglycemic), then do not take them on the morning of your procedure.  You may take them after you have had the procedure.  Do not take aspirin or any aspirin-containing medications, at least eleven (11) days prior to the procedure.  They may prolong bleeding.  Wear loose fitting clothing that may be easy to take off and that you would not mind if it got stained with Betadine or blood.  Do not wear any jewelry or perfume  Remove any nail coloring.  It will interfere with some of our monitoring equipment.  NOTE: Remember that this is not meant to be interpreted as a complete list of all possible complications.  Unforeseen problems may occur.  BLOOD THINNERS The following drugs contain aspirin or other products, which can cause increased bleeding during surgery and should not be taken for 2 weeks prior to and 1 week after surgery.  If you should need take something for relief of minor pain, you may take acetaminophen which is found in Tylenol,m Datril, Anacin-3 and Panadol. It is not blood thinner. The products listed below are.  Do not take any of the products listed below in addition to any listed on your instruction sheet.  A.P.C or A.P.C with Codeine Codeine Phosphate Capsules #3  Ibuprofen Ridaura  ABC compound Congesprin Imuran rimadil  Advil Cope Indocin Robaxisal  Alka-Seltzer Effervescent Pain Reliever and Antacid Coricidin or Coricidin-D  Indomethacin Rufen  Alka-Seltzer plus Cold Medicine Cosprin Ketoprofen S-A-C Tablets  Anacin Analgesic Tablets or Capsules Coumadin Korlgesic Salflex  Anacin Extra Strength Analgesic tablets or capsules CP-2 Tablets Lanoril Salicylate  Anaprox Cuprimine Capsules Levenox Salocol  Anexsia-D Dalteparin Magan Salsalate  Anodynos Darvon compound Magnesium Salicylate Sine-off  Ansaid Dasin Capsules Magsal Sodium Salicylate  Anturane Depen Capsules Marnal Soma  APF Arthritis pain formula Dewitt's Pills Measurin Stanback  Argesic Dia-Gesic Meclofenamic Sulfinpyrazone  Arthritis Bayer Timed Release Aspirin Diclofenac Meclomen Sulindac  Arthritis pain formula Anacin Dicumarol Medipren Supac  Analgesic (Safety coated) Arthralgen Diffunasal Mefanamic Suprofen  Arthritis Strength Bufferin Dihydrocodeine Mepro Compound Suprol  Arthropan liquid Dopirydamole Methcarbomol with Aspirin Synalgos  ASA tablets/Enseals Disalcid Micrainin Tagament  Ascriptin Doan's Midol Talwin  Ascriptin A/D Dolene Mobidin Tanderil  Ascriptin Extra Strength Dolobid Moblgesic Ticlid  Ascriptin with Codeine Doloprin or Doloprin with Codeine Momentum Tolectin  Asperbuf Duoprin Mono-gesic Trendar  Aspergum Duradyne Motrin or Motrin IB Triminicin  Aspirin plain, buffered or enteric coated Durasal Myochrisine Trigesic  Aspirin Suppositories Easprin Nalfon Trillsate  Aspirin with Codeine Ecotrin Regular or Extra Strength Naprosyn Uracel  Atromid-S Efficin Naproxen Ursinus  Auranofin Capsules Elmiron Neocylate Vanquish  Axotal Emagrin Norgesic Verin  Azathioprine Empirin or Empirin with Codeine Normiflo Vitamin E  Azolid Emprazil Nuprin Voltaren  Bayer Aspirin plain, buffered or children's or timed BC Tablets or powders Encaprin Orgaran Warfarin Sodium   Buff-a-Comp Enoxaparin Orudis Zorpin  Buff-a-Comp with Codeine Equegesic Os-Cal-Gesic   Buffaprin Excedrin plain, buffered or Extra Strength Oxalid   Bufferin Arthritis Strength Feldene Oxphenbutazone   Bufferin plain or Extra Strength Feldene Capsules Oxycodone with Aspirin  Bufferin with Codeine Fenoprofen Fenoprofen Pabalate or Pabalate-SF   Buffets II Flogesic Panagesic   Buffinol plain or Extra Strength Florinal or Florinal with Codeine Panwarfarin   Buf-Tabs Flurbiprofen Penicillamine   Butalbital Compound Four-way cold tablets Penicillin   Butazolidin Fragmin Pepto-Bismol   Carbenicillin Geminisyn Percodan   Carna Arthritis Reliever Geopen Persantine   Carprofen Gold's salt Persistin   Chloramphenicol Goody's Phenylbutazone   Chloromycetin Haltrain Piroxlcam   Clmetidine heparin Plaquenil   Cllnoril Hyco-pap Ponstel   Clofibrate Hydroxy chloroquine Propoxyphen         Before stopping any of these medications, be sure to consult the physician who ordered them.  Some, such as Coumadin (Warfarin) are ordered to prevent or treat serious conditions such as "deep thrombosis", "pumonary embolisms", and other heart problems.  The amount of time that you may need off of the medication may also vary with the medication and the reason for which you were taking it.  If you are taking any of these medications, please make sure you notify your pain physician before you undergo any procedures.         Sacroiliac (SI) Joint Injection Patient Information  Description: The sacroiliac joint connects the scrum (very low back and tailbone) to the ilium (a pelvic bone which also forms half of the hip joint).  Normally this joint experiences very little motion.  When this joint becomes inflamed or unstable low back and or hip and pelvis pain may result.  Injection of this joint with local anesthetics (numbing medicines) and steroids can provide diagnostic information and reduce pain.  This  injection is performed with the aid of x-ray guidance into the tailbone area while you are lying on your stomach.   You may experience an electrical sensation down the leg while this is being done.  You may also experience numbness.  We also may ask if we are reproducing your normal pain during the injection.  Conditions which may be treated SI injection:   Low back, buttock, hip or leg pain  Preparation for the Injection:  5. Do not eat any solid food or dairy products within 8 hours of your appointment.  6. You may drink clear liquids up to 3 hours before appointment.  Clear liquids include water, black coffee, juice or soda.  No milk or cream please. 7. You may take your regular medications, including pain medications with a sip of water before your appointment.  Diabetics should hold regular insulin (if take separately) and take 1/2 normal NPH dose the morning of the procedure.  Carry some sugar containing items with you to your appointment. 8. A driver must accompany you and be prepared to drive you home after your procedure. 9. Bring all of your current medications with you. 10. An IV may be inserted and sedation may be given at the discretion of the physician. 11. A blood pressure cuff, EKG and other monitors will often be applied during the procedure.  Some patients may need to have extra oxygen administered for a short period.  12. You will be asked to provide medical information, including your allergies, prior to the procedure.  We must know immediately if you are taking blood thinners (like Coumadin/Warfarin) or if you are allergic to IV iodine contrast (dye).  We must know if you could possible be pregnant.  Possible side effects:   Bleeding from needle site  Infection (rare, may require surgery)  Nerve injury (rare)  Numbness & tingling (temporary)  A brief convulsion  or seizure  Light-headedness (temporary)  Pain at injection site (several days)  Decreased blood  pressure (temporary)  Weakness in the leg (temporary)   Call if you experience:   New onset weakness or numbness of an extremity below the injection site that last more than 8 hours.  Hives or difficulty breathing ( go to the emergency room)  Inflammation or drainage at the injection site  Any new symptoms which are concerning to you  Please note:  Although the local anesthetic injected can often make your back/ hip/ buttock/ leg feel good for several hours after the injections, the pain will likely return.  It takes 3-7 days for steroids to work in the sacroiliac area.  You may not notice any pain relief for at least that one week.  If effective, we will often do a series of three injections spaced 3-6 weeks apart to maximally decrease your pain.  After the initial series, we generally will wait some months before a repeat injection of the same type.  If you have any questions, please call [phone] [facility]  Naloxone nasal spray What is this medicine? NALOXONE (nal OX one) is a narcotic blocker. It is used to treat narcotic drug overdose. This medicine may be used for other purposes; ask your health care provider or pharmacist if you have questions. What should I tell my health care provider before I take this medicine? They need to know if you have any of these conditions: -drug abuse or addiction -heart disease -an unusual or allergic reaction to naloxone, other medicines, foods, dyes, or preservatives -pregnant or trying to get pregnant -breast-feeding How should I use this medicine? This medicine is for use in the nose. Lay the person on their back. Support their neck with your hand and allow the head to tilt back before giving the medicine. The nasal spray should be given into 1 nostril. After giving the medicine, move the person onto their side. Do not remove or test the nasal spray until ready to use. Get emergency medical help  right away after giving the first dose of this medicine, even if the person wakes up. You should be familiar with how to recognize the signs and symptoms of a narcotic overdose. If more doses are needed, give the additional dose in the other nostril. Talk to your pediatrician regarding the use of this medicine in children. While this drug may be prescribed for children as young as newborns for selected conditions, precautions do apply. Overdosage: If you think you have taken too much of this medicine contact a poison control center or emergency room at once. NOTE: This medicine is only for you. Do not share this medicine with others. What if I miss a dose? This does not apply. What may interact with this medicine? This medicine is only used during an emergency. No interactions are expected during emergency use. This list may not describe all possible interactions. Give your health care provider a list of all the medicines, herbs, non-prescription drugs, or dietary supplements you use. Also tell them if you smoke, drink alcohol, or use illegal drugs. Some items may interact with your medicine. What should I watch for while using this medicine? Keep this medicine ready for use in the case of a narcotic overdose. Make sure that you have the phone number of your doctor or health care professional and local hospital ready. You may need to have additional doses of this medicine. Each nasal spray contains  a single dose. Some emergencies may require additional doses. After use, bring the treated person to the nearest hospital or call 911. Make sure the treating health care professional knows that the person has received an injection of this medicine. You will receive additional instructions on what to do during and after use of this medicine before an emergency occurs. What side effects may I notice from receiving this medicine? Side effects that you should report to your doctor or health care professional as  soon as possible: -allergic reactions like skin rash, itching or hives, swelling of the face, lips, or tongue -breathing problems -fast, irregular heartbeat -high blood pressure -pain that was controlled by narcotic pain medicine -seizures Side effects that usually do not require medical attention (report these to your doctor or health care professional if they continue or are bothersome): -anxious -chills -diarrhea -fever -headache -muscle pain -nausea, vomiting -nose irritation like dryness, congestion, and swelling -sweating This list may not describe all possible side effects. Call your doctor for medical advice about side effects. You may report side effects to FDA at 1-800-FDA-1088. Where should I keep my medicine? Keep out of the reach of children. Store between 4 and 40 degrees C (39 and 104 degrees F). Do not freeze. Throw away any unused medicine after the expiration date. Keep in original box until ready to use. NOTE: This sheet is a summary. It may not cover all possible information. If you have questions about this medicine, talk to your doctor, pharmacist, or health care provider.    2016, Elsevier/[employer] Standard. (2014-02-04 14:24:03)

## 2015-06-04 LAB — TOXASSURE SELECT 13 (MW), URINE: PDF: 0

## 2015-06-06 ENCOUNTER — Ambulatory Visit
Admission: RE | Admit: 2015-06-06 | Discharge: 2015-06-06 | Disposition: A | Discharge status: Home / Self Care | Payer: Medicare Other | Source: Ambulatory Visit | Attending: Pain Medicine | Admitting: Pain Medicine

## 2015-06-06 ENCOUNTER — Ambulatory Visit (HOSPITAL_BASED_OUTPATIENT_CLINIC_OR_DEPARTMENT_OTHER): Payer: Medicare Other | Admitting: Pain Medicine

## 2015-06-06 ENCOUNTER — Encounter: Payer: Self-pay | Admitting: Pain Medicine

## 2015-06-06 ENCOUNTER — Telehealth: Payer: Self-pay

## 2015-06-06 VITALS — BP 145/100 | HR 72 | Temp 98.1°F | Resp 16 | Ht 70.0 in | Wt 220.0 lb

## 2015-06-06 DIAGNOSIS — M5136 Other intervertebral disc degeneration, lumbar region: Secondary | ICD-10-CM | POA: Insufficient documentation

## 2015-06-06 DIAGNOSIS — M160 Bilateral primary osteoarthritis of hip: Secondary | ICD-10-CM | POA: Insufficient documentation

## 2015-06-06 DIAGNOSIS — M545 Low back pain, unspecified: Secondary | ICD-10-CM

## 2015-06-06 DIAGNOSIS — M25552 Pain in left hip: Secondary | ICD-10-CM | POA: Diagnosis present

## 2015-06-06 DIAGNOSIS — M533 Sacrococcygeal disorders, not elsewhere classified: Secondary | ICD-10-CM

## 2015-06-06 DIAGNOSIS — G8929 Other chronic pain: Secondary | ICD-10-CM

## 2015-06-06 DIAGNOSIS — Z981 Arthrodesis status: Secondary | ICD-10-CM | POA: Insufficient documentation

## 2015-06-06 DIAGNOSIS — S79912A Unspecified injury of left hip, initial encounter: Secondary | ICD-10-CM | POA: Diagnosis not present

## 2015-06-06 DIAGNOSIS — S3992XA Unspecified injury of lower back, initial encounter: Secondary | ICD-10-CM | POA: Diagnosis not present

## 2015-06-06 NOTE — Progress Notes (Signed)
Patient ID: Kristopher [name], male   DOB: December 09, 1960, 55 y.o.   MRN: [id number] This appointment was canceled due to the fact that the patient was not sent to see an allergist for his claim of anaphylactic/anaphylactoid allergy to lidocaine. Since we did not have that information at hand, we decided to postpone this elective procedure.

## 2015-06-06 NOTE — Progress Notes (Signed)
Safety precautions to be maintained throughout the outpatient stay will include: orient to surroundings, keep bed in low position, maintain call bell within reach at all times, provide assistance with transfer out of bed and ambulation.  

## 2015-06-06 NOTE — Patient Instructions (Signed)
Smoking Cessation, Tips for Success If you are ready to quit smoking, congratulations! You have chosen to help yourself be healthier. Cigarettes bring nicotine, tar, carbon monoxide, and other irritants into your body. Your lungs, heart, and blood vessels will be able to work better without these poisons. There are many different ways to quit smoking. Nicotine gum, nicotine patches, a nicotine inhaler, or nicotine nasal spray can help with physical craving. Hypnosis, support groups, and medicines help break the habit of smoking. WHAT THINGS CAN I DO TO MAKE QUITTING EASIER?  Here are some tips to help you quit for good:  Pick a date when you will quit smoking completely. Tell all of your friends and family about your plan to quit on that date.  Do not try to slowly cut down on the number of cigarettes you are smoking. Pick a quit date and quit smoking completely starting on that day.  Throw away all cigarettes.   Clean and remove all ashtrays from your home, work, and car.  On a card, write down your reasons for quitting. Carry the card with you and read it when you get the urge to smoke.  Cleanse your body of nicotine. Drink enough water and fluids to keep your urine clear or pale yellow. Do this after quitting to flush the nicotine from your body.  Learn to predict your moods. Do not let a bad situation be your excuse to have a cigarette. Some situations in your life might tempt you into wanting a cigarette.  Never have "just one" cigarette. It leads to wanting another and another. Remind yourself of your decision to quit.  Change habits associated with smoking. If you smoked while driving or when feeling stressed, try other activities to replace smoking. Stand up when drinking your coffee. Brush your teeth after eating. Sit in a different chair when you read the paper. Avoid alcohol while trying to quit, and try to drink fewer caffeinated beverages. Alcohol and caffeine may urge you to  smoke.  Avoid foods and drinks that can trigger a desire to smoke, such as sugary or spicy foods and alcohol.  Ask people who smoke not to smoke around you.  Have something planned to do right after eating or having a cup of coffee. For example, plan to take a walk or exercise.  Try a relaxation exercise to calm you down and decrease your stress. Remember, you may be tense and nervous for the first 2 weeks after you quit, but this will pass.  Find new activities to keep your hands busy. Play with a pen, coin, or rubber band. Doodle or draw things on paper.  Brush your teeth right after eating. This will help cut down on the craving for the taste of tobacco after meals. You can also try mouthwash.   Use oral substitutes in place of cigarettes. Try using lemon drops, carrots, cinnamon sticks, or chewing gum. Keep them handy so they are available when you have the urge to smoke.  When you have the urge to smoke, try deep breathing.  Designate your home as a nonsmoking area.  If you are a heavy smoker, ask your health care provider about a prescription for nicotine chewing gum. It can ease your withdrawal from nicotine.  Reward yourself. Set aside the cigarette money you save and buy yourself something nice.  Look for support from others. Join a support group or smoking cessation program. Ask someone at home or at work to help you with your plan   to quit smoking.  Always ask yourself, "Do I need this cigarette or is this just a reflex?" Tell yourself, "Today, I choose not to smoke," or "I do not want to smoke." You are reminding yourself of your decision to quit.  Do not replace cigarette smoking with electronic cigarettes (commonly called e-cigarettes). The safety of e-cigarettes is unknown, and some may contain harmful chemicals.  If you relapse, do not give up! Plan ahead and think about what you will do the next time you get the urge to smoke. HOW WILL I FEEL WHEN I QUIT SMOKING? You  may have symptoms of withdrawal because your body is used to nicotine (the addictive substance in cigarettes). You may crave cigarettes, be irritable, feel very hungry, cough often, get headaches, or have difficulty concentrating. The withdrawal symptoms are only temporary. They are strongest when you first quit but will go away within 10-14 days. When withdrawal symptoms occur, stay in control. Think about your reasons for quitting. Remind yourself that these are signs that your body is healing and getting used to being without cigarettes. Remember that withdrawal symptoms are easier to treat than the major diseases that smoking can cause.  Even after the withdrawal is over, expect periodic urges to smoke. However, these cravings are generally short lived and will go away whether you smoke or not. Do not smoke! WHAT RESOURCES ARE AVAILABLE TO HELP ME QUIT SMOKING? Your health care provider can direct you to community resources or hospitals for support, which may include:  Group support.  Education.  Hypnosis.  Therapy.   This information is not intended to replace advice given to you by your health care provider. Make sure you discuss any questions you have with your health care provider.   Document Released: 11/17/2003 Document Revised: 03/11/2014 Document Reviewed: 08/06/2012 Elsevier Interactive Patient Education 2016 [employer]  

## 2015-06-06 NOTE — Telephone Encounter (Signed)
Dr. Dossie Arbour has been notified by [facility] that we are in search of finding an allergy doctor to test for lidocain. Dr Dossie Arbour said he has no recommendations where the pt can go. I have called 3 offices so far with no luck

## 2015-06-07 ENCOUNTER — Telehealth: Payer: Self-pay | Admitting: *Deleted

## 2015-06-07 NOTE — Telephone Encounter (Signed)
Attempted to call patient for post procedure follow-up. The phone number listed is not valid.

## 2015-06-07 NOTE — Progress Notes (Signed)
Quick Note:  The results of this diagnostic imaging were reviewed and found to be abnormal. No acute injury or pathology identified. Consider interventional pain management techniques. Based on this results, the patient may be a candidate candidate for either intra-articular hip joint injections and/or intra-articular sacroiliac joint injections. ______

## 2015-07-24 ENCOUNTER — Encounter: Payer: Self-pay | Admitting: Pain Medicine

## 2015-07-24 ENCOUNTER — Ambulatory Visit: Payer: Medicare Other | Attending: Pain Medicine | Admitting: Pain Medicine

## 2015-07-24 VITALS — BP 133/90 | HR 71 | Temp 98.2°F | Resp 16 | Ht 70.0 in | Wt 200.0 lb

## 2015-07-24 DIAGNOSIS — Z5181 Encounter for therapeutic drug level monitoring: Secondary | ICD-10-CM | POA: Diagnosis not present

## 2015-07-24 DIAGNOSIS — M545 Low back pain, unspecified: Secondary | ICD-10-CM

## 2015-07-24 DIAGNOSIS — F1190 Opioid use, unspecified, uncomplicated: Secondary | ICD-10-CM

## 2015-07-24 DIAGNOSIS — M5481 Occipital neuralgia: Secondary | ICD-10-CM | POA: Insufficient documentation

## 2015-07-24 DIAGNOSIS — G8929 Other chronic pain: Secondary | ICD-10-CM | POA: Insufficient documentation

## 2015-07-24 DIAGNOSIS — M4802 Spinal stenosis, cervical region: Secondary | ICD-10-CM | POA: Diagnosis not present

## 2015-07-24 DIAGNOSIS — M47812 Spondylosis without myelopathy or radiculopathy, cervical region: Secondary | ICD-10-CM | POA: Diagnosis not present

## 2015-07-24 DIAGNOSIS — M25552 Pain in left hip: Secondary | ICD-10-CM | POA: Diagnosis not present

## 2015-07-24 DIAGNOSIS — F17210 Nicotine dependence, cigarettes, uncomplicated: Secondary | ICD-10-CM | POA: Diagnosis not present

## 2015-07-24 DIAGNOSIS — M5126 Other intervertebral disc displacement, lumbar region: Secondary | ICD-10-CM | POA: Insufficient documentation

## 2015-07-24 DIAGNOSIS — Z79891 Long term (current) use of opiate analgesic: Secondary | ICD-10-CM | POA: Insufficient documentation

## 2015-07-24 DIAGNOSIS — M47816 Spondylosis without myelopathy or radiculopathy, lumbar region: Secondary | ICD-10-CM

## 2015-07-24 DIAGNOSIS — M549 Dorsalgia, unspecified: Secondary | ICD-10-CM | POA: Diagnosis present

## 2015-07-24 DIAGNOSIS — M5127 Other intervertebral disc displacement, lumbosacral region: Secondary | ICD-10-CM | POA: Diagnosis not present

## 2015-07-24 DIAGNOSIS — M4316 Spondylolisthesis, lumbar region: Secondary | ICD-10-CM | POA: Insufficient documentation

## 2015-07-24 DIAGNOSIS — E876 Hypokalemia: Secondary | ICD-10-CM | POA: Diagnosis not present

## 2015-07-24 MED ORDER — OXYCODONE HCL 10 MG PO TABS: 10.0000 mg | ORAL_TABLET | Freq: Four times a day (QID) | ORAL | Status: DC | PRN

## 2015-07-24 NOTE — Progress Notes (Signed)
Patient's Name: Kristopher [name]  Patient type: Established  MRN: [id number]  Service setting: Ambulatory outpatient  DOB: 04/11/1960  Location: [facility]  DOS: 07/24/2015  Primary Care Physician: Freddy Finner, NP  Note by: Kathlen Brunswick. Dossie Arbour, M.D, DABA, DABAPM, DABPM, DABIPP, [place]  Referring Physician: Freddy Finner, NP  Specialty: Board-Certified Interventional Pain Management  Last Visit to Pain Management: 06/07/2015   Primary Reason(s) for Visit: Encounter for prescription drug management (Level of risk: moderate) CC: Back Pain   HPI  Kristopher [name] is a 55 y.o. year old, male patient, who returns today as an established patient. 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); Chronic neck pain (posterior midline) (Bilateral) (L>R); Cervical spondylosis; Chronic cervical radicular pain (Location of Tertiary source of pain) (Bilateral) (C5/C6 dermatome) (L>R); Long term current use of opiate analgesic; Long term prescription opiate use; Opiate use (60 MME/Day); Opioid dependence ([place]); Encounter for therapeutic drug level monitoring; Cervical spinal stenosis (C4-5); Cervical foraminal stenosis (Bilateral C5-6); Hypokalemia; Retrolisthesis of L5-S1; Cervical disc herniation (C4-5 and C5-6); Lumbar disc herniation (L5-S1); Chronic sacroiliac joint pain (Bilateral) (L>R); Allergy history, anesthetic (Unconfirmed allergy to Lidocaine); Chronic hip pain (Left); Lumbar facet syndrome (Location of Primary Source of Pain) (Bilateral) (L>R); Chronic lower extremity pain (Location of Secondary source of pain) (Left); Chronic upper extremity pain (Location of Tertiary source of pain) (Bilateral) (L>R); and Greater occipital neuralgia (Right) on his problem  list.. His primarily concern today is the Back Pain   Pain Assessment: Self-Reported Pain Score: 4  Reported level is compatible with observation Pain Type: Chronic pain Pain Location: Back Pain Orientation: Lower Pain Descriptors / Indicators: Aching, Constant Pain Frequency: Intermittent (especially  when sleeping, pain comes and goes, lasts about 10 seconds)  The patient comes into the clinics today for pharmacological management of his chronic pain. I last saw this patient on 06/06/2015. The patient  reports that he does not use illicit drugs. His body mass index is 28.7 kg/(m^2).  Date of Last Visit: 06/06/15 Service Provided on Last Visit: Evaluation (patient procedure was cancelled d/t allergy to lidocaine)  Controlled Substance Pharmacotherapy Assessment & REMS (Risk Evaluation and Mitigation Strategy)  Analgesic: Oxycodone IR 10 mg every 6 hours (40 mg/day of oxycodone) Pill Count: Pill count 34/120 10 mg tablets filled on 07/04/2015 MME/day: 60 mg/day Pharmacokinetics: Onset of action (Liberation/Absorption): Within expected pharmacological parameters Time to Peak effect (Distribution): Timing and results are as within normal expected parameters Duration of action (Metabolism/Excretion): Within normal limits for medication Pharmacodynamics: Analgesic Effect: More than 50% Activity Facilitation: Medication(s) allow patient to sit, stand, walk, and do the basic ADLs Perceived Effectiveness: Described as relatively effective, allowing for increase in activities of daily living (ADL) Side-effects or Adverse reactions: None reported Monitoring: [place] PMP: Online review of the past 76-monthperiod conducted. Compliant with practice rules and regulations UDS Results/interpretation: The patient's last UDS was done on 05/31/2015 and it came back within normal limits with no unexpected result. Medication Assessment Form: Reviewed. Patient indicates being compliant with therapy Treatment  compliance: Compliant Risk Assessment: Aberrant Behavior: None observed today Substance Use Disorder (SUD) Risk Level: No change since last visit Risk of opioid abuse or dependence: 0.7-3.0% with doses ? 36 MME/day and 6.1-26% with doses ? 120 MME/day. Opioid Risk Tool (ORT) Score:  0 Low  Risk for SUD (Score <3) Depression Scale Score: PHQ-2: PHQ-2 Total Score: 0 No depression (0) PHQ-9: PHQ-9 Total Score: 0 No depression (0-4)  Pharmacologic Plan: No change in therapy, at this time  Laboratory Chemistry  Inflammation Markers Lab Results  Component Value Date   ESRSEDRATE 11 04/03/2015   CRP 0.7 04/03/2015    Renal Function Lab Results  Component Value Date   BUN 12 04/03/2015   CREATININE 0.83 04/03/2015   GFRAA >60 04/03/2015   GFRNONAA >60 04/03/2015    Hepatic Function Lab Results  Component Value Date   AST 46* 04/03/2015   ALT 77* 04/03/2015   ALBUMIN 4.6 04/03/2015    Electrolytes Lab Results  Component Value Date   NA 139 04/03/2015   K 4.0 04/03/2015   CL 106 04/03/2015   CALCIUM 9.4 04/03/2015   MG 2.0 04/03/2015    Pain Modulating Vitamins No results found for: [place], VD125OH2TOT, [id number], CH8850YD7, VITAMINB12  Coagulation Parameters No results found for: INR, LABPROT  Note: I personally reviewed the above data. Results made available to patient.  Recent Diagnostic Imaging  Dg Lumbar Spine Complete W/bend  06/06/2015  CLINICAL DATA:  Fall.  Chronic pain.  Initial evaluation . EXAM: LUMBAR SPINE - COMPLETE WITH BENDING VIEWS COMPARISON:  No prior. FINDINGS: Prior anterior interbody L5-S1 fusion with good anatomic alignment. No acute bony abnormality identified. No flexion or extension deformity noted. IMPRESSION: Prior anterior and interbody L5-S1 fusion with good anatomic alignment.Diffuse multilevel degenerative change. No acute abnormality. Electronically Signed   By: Marcello Moores  Register   On: 06/06/2015 13:14   Dg Si Joints  06/06/2015   CLINICAL DATA:  [place] foot off a roof in 2011, chronic low back pain radiating to LEFT hip since, multiple back surgeries since fall, chronic SI joint pain EXAM: BILATERAL SACROILIAC JOINTS - 3+ VIEW COMPARISON:  None FINDINGS: SI joints symmetric and preserved. Prior L5-S1 fusion. Sacral foramina grossly symmetric. No fracture or bone destruction. Spur formation at LEFT femoral head. IMPRESSION: Prior L5-S1 fusion. Degenerative changes LEFT hip. Unremarkable SI joints. Electronically Signed   By: Lavonia Dana M.D.   On: 06/06/2015 13:13   Dg Hip Unilat W Or W/o Pelvis 2-3 Views Left  06/06/2015  CLINICAL DATA:  [place] 28 foot off a roof in 2011, chronic low back pain radiating to LEFT hip since, multiple back surgeries since fall EXAM: DG HIP (WITH OR WITHOUT PELVIS) 2-3V LEFT COMPARISON:  Non FINDINGS: Osseous mineralization grossly normal. Prior L5-S1 fusion. SI joints symmetric and preserved. Degenerative changes of both hip joints with joint space narrowing and spur formation greater on LEFT. No acute fracture, dislocation, or bone destruction. IMPRESSION: Degenerative changes of BILATERAL hip joints. Electronically Signed   By: Lavonia Dana M.D.   On: 06/06/2015 13:14    Meds  The patient has a current medication list which includes the following prescription(s): naloxone hcl, oxycodone hcl, oxycodone hcl, oxycodone hcl, and sumatriptan.  Current Outpatient Prescriptions on File Prior to Visit  Medication Sig  . Naloxone HCl (NARCAN) 4 MG/0.1ML LIQD Place 1 Bottle into the nose once. , then call 911, repeat if needed in other nostril with new bottle.  . SUMAtriptan (IMITREX) 50 MG tablet Take 50 mg by mouth every 2 (two) hours as needed for migraine. May repeat in 2 hours if headache persists or recurs.   No current facility-administered medications on file prior to visit.    ROS  Constitutional: Denies any fever or chills Gastrointestinal: No reported  hemesis, hematochezia, vomiting, or acute  GI distress Musculoskeletal: Denies any acute onset joint swelling, redness, loss of ROM, or weakness Neurological: No reported episodes of acute onset apraxia, aphasia, dysarthria, agnosia, amnesia, paralysis, loss of coordination, or loss of consciousness  Allergies  Mr. Flegel is allergic to lidocaine; penicillins; and strawberry (diagnostic).  [place]  Medical:  Mr. Compean  has a past medical history of Hypertension and Allergy. Family: family history includes Cancer in his father. Surgical:  has past surgical history that includes Back surgery; Appendectomy; and Leg Surgery. Tobacco:  reports that he has been smoking Cigarettes.  He has a 17.5 pack-year smoking history. He does not have any smokeless tobacco history on file. Alcohol:  reports that he does not drink alcohol. Drug:  reports that he does not use illicit drugs.  Constitutional Exam  Vitals: Blood pressure 133/90, pulse 71, temperature 98.2 F (36.8 C), temperature source Oral, resp. rate 16, height '5\' 10"'$  (1.778 m), weight 200 lb (90.719 kg), SpO2 98 %. General appearance: Well nourished, well developed, and well hydrated. In no acute distress Calculated BMI/Body habitus: Body mass index is 28.7 kg/(m^2). (25-29.9 kg/m2) Overweight - 20% higher incidence of chronic pain Psych/Mental status: Alert and oriented x 3 (person, place, & time) Eyes: PERLA Respiratory: No evidence of acute respiratory distress  Cervical Spine Exam  Inspection: No masses, redness, or swelling Alignment: Symmetrical ROM: Functional: ROM is within functional limits [facility]) Stability: No instability detected Muscle strength & Tone: Functionally intact Sensory: Unimpaired Palpation: No complaints of tenderness  Upper Extremity (UE) Exam    Side: Right upper extremity  Side: Left upper extremity  Inspection: No masses, redness, swelling, or asymmetry  Inspection: No masses, redness, swelling, or asymmetry  ROM:  ROM:  Functional: ROM is within  functional limits [facility])  Functional: ROM is within functional limits [facility])  Muscle strength & Tone: Functionally intact  Muscle strength & Tone: Functionally intact  Sensory: Unimpaired  Sensory: Unimpaired  Palpation: Non-contributory  Palpation: Non-contributory   Thoracic Spine Exam  Inspection: No masses, redness, or swelling Alignment: Symmetrical ROM: Functional: ROM is within functional limits [facility]) Stability: No instability detected Sensory: Unimpaired Muscle strength & Tone: Functionally intact Palpation: No complaints of tenderness  Lumbar Spine Exam  Inspection: No masses, redness, or swelling Alignment: Symmetrical ROM: Functional: ROM is within functional limits [facility]) Stability: No instability detected Muscle strength & Tone: Functionally intact Sensory: Unimpaired Palpation: No complaints of tenderness Provocative Tests: Lumbar Hyperextension and rotation test: deferred Patrick's Maneuver: deferred  Gait & Posture Assessment  Ambulation: Unassisted Gait: Unaffected Posture: WNL  Lower Extremity Exam    Side: Right lower extremity  Side: Left lower extremity  Inspection: No masses, redness, swelling, or asymmetry ROM:  Inspection: No masses, redness, swelling, or asymmetry ROM:  Functional: ROM is within functional limits [facility])  Functional: ROM is within functional limits [facility])  Muscle strength & Tone: Functionally intact  Muscle strength & Tone: Functionally intact  Sensory: Unimpaired  Sensory: Unimpaired  Palpation: Non-contributory  Palpation: Non-contributory   Assessment & Plan  Primary Diagnosis & Pertinent Problem List: The primary encounter diagnosis was Chronic pain. Diagnoses of Encounter for therapeutic drug level monitoring, Long term current use of opiate analgesic, Opiate use (60 MME/Day), Lumbar facet syndrome (Location of Primary Source of Pain) (Bilateral) (L>R), Chronic low back pain (Location of Primary Source of Pain) (Bilateral) (L>R),  and Chronic hip pain (Left) were also pertinent to this visit.  Visit Diagnosis: 1. Chronic pain  2. Encounter for therapeutic drug level monitoring   3. Long term current use of opiate analgesic   4. Opiate use (60 MME/Day)   5. Lumbar facet syndrome (Location of Primary Source of Pain) (Bilateral) (L>R)   6. Chronic low back pain (Location of Primary Source of Pain) (Bilateral) (L>R)   7. Chronic hip pain (Left)     Problems updated and reviewed during this visit: No problems updated.  Problem-specific Plan(s): No problem-specific assessment & plan notes found for this encounter.  No new assessment & plan notes have been filed under this hospital service since the last note was generated. Service: Pain Management   Plan of Care   Problem List Items Addressed This Visit      High   Chronic hip pain (Left) (Chronic)   Relevant Orders   HIP INJECTION   Chronic low back pain (Location of Primary Source of Pain) (Bilateral) (L>R) (Chronic)   Relevant Medications   Oxycodone HCl 10 MG TABS   Oxycodone HCl 10 MG TABS   Oxycodone HCl 10 MG TABS   Chronic pain - Primary (Chronic)   Relevant Medications   Oxycodone HCl 10 MG TABS   Oxycodone HCl 10 MG TABS   Oxycodone HCl 10 MG TABS   Lumbar facet syndrome (Location of Primary Source of Pain) (Bilateral) (L>R) (Chronic)   Relevant Medications   Oxycodone HCl 10 MG TABS   Oxycodone HCl 10 MG TABS   Oxycodone HCl 10 MG TABS     Medium   Encounter for therapeutic drug level monitoring   Long term current use of opiate analgesic (Chronic)   Opiate use (60 MME/Day) (Chronic)       Pharmacotherapy (Medications Ordered): Meds ordered this encounter  Medications  . Oxycodone HCl 10 MG TABS    Sig: Take 1 tablet (10 mg total) by mouth every 6 (six) hours as needed.    Dispense:  120 tablet    Refill:  0    Do not place this medication, or any other prescription from our practice, on "Automatic Refill". Patient may have  prescription filled one day early if pharmacy is closed on scheduled refill date. Do not fill until: 09/02/15 To last until: 10/02/15  . Oxycodone HCl 10 MG TABS    Sig: Take 1 tablet (10 mg total) by mouth every 6 (six) hours as needed.    Dispense:  120 tablet    Refill:  0    Do not place this medication, or any other prescription from our practice, on "Automatic Refill". Patient may have prescription filled one day early if pharmacy is closed on scheduled refill date. Do not fill until: 08/03/15 To last until: 09/02/15  . Oxycodone HCl 10 MG TABS    Sig: Take 1 tablet (10 mg total) by mouth every 6 (six) hours as needed.    Dispense:  120 tablet    Refill:  0    Do not place this medication, or any other prescription from our practice, on "Automatic Refill". Patient may have prescription filled one day early if pharmacy is closed on scheduled refill date. Do not fill until: 10/02/15 To last until: 11/01/15    [facility] & Procedure Ordered: Orders Placed This Encounter  Procedures  . HIP INJECTION    Imaging Ordered: None  Interventional Therapies: Scheduled:  None at this time.    Considering:  Possible diagnostic bilateral intra-articular hip joint injections.    PRN Procedures:  Diagnostic bilateral intra-articular hip joint injection  under fluoroscopic guidance with or without sedation.    Referral(s) or Consult(s): None at this time.  New Prescriptions   No medications on file    Medications administered during this visit: Mr. Vallance had no medications administered during this visit.  Requested PM Follow-up: Return in about 3 months (around 10/16/2015) for Medication Management, (3-Mo).  Future Appointments Date Time Provider [place]  10/11/2015 10:00 AM [name], MD [facility] None    Primary Care Physician: Freddy Finner, NP Location: [facility] Note by: Beatriz Chancellor A. Dossie Arbour, M.D, DABA, DABAPM, DABPM, DABIPP,  FIPP  Pain Score Disclaimer: We use the NRS-11 scale. This is a self-reported, subjective measurement of pain severity with only modest accuracy. It is used primarily to identify changes within a particular patient. It must be understood that outpatient pain scales are significantly less accurate that those used for research, where they can be applied under ideal controlled circumstances with minimal exposure to variables. In reality, the score is likely to be a combination of pain intensity and pain affect, where pain affect describes the degree of emotional arousal or changes in action readiness caused by the sensory experience of pain. Factors such as social and work situation, setting, emotional state, anxiety levels, expectation, and prior pain experience may influence pain perception and show large inter-individual differences that may also be affected by time variables.  Patient instructions provided during this appointment: Patient Instructions   GENERAL RISKS AND COMPLICATIONS  What are the risk, side effects and possible complications? Generally speaking, most procedures are safe.  However, with any procedure there are risks, side effects, and the possibility of complications.  The risks and complications are dependent upon the sites that are lesioned, or the type of nerve block to be performed.  The closer the procedure is to the spine, the more serious the risks are.  Great care is taken when placing the radio frequency needles, block needles or lesioning probes, but sometimes complications can occur. 1. Infection: Any time there is an injection through the skin, there is a risk of infection.  This is why sterile conditions are used for these blocks.  There are four possible types of infection. 1. Localized skin infection. 2. Central Nervous System Infection-This can be in the form of Meningitis, which can be deadly. 3. Epidural Infections-This can be in the form of an epidural abscess,  which can cause pressure inside of the spine, causing compression of the spinal cord with subsequent paralysis. This would require an emergency surgery to decompress, and there are no guarantees that the patient would recover from the paralysis. 4. Discitis-This is an infection of the intervertebral discs.  It occurs in about 1% of discography procedures.  It is difficult to treat and it may lead to surgery.        2. Pain: the needles have to go through skin and soft tissues, will cause soreness.       3. Damage to internal structures:  The nerves to be lesioned may be near blood vessels or    other nerves which can be potentially damaged.       4. Bleeding: Bleeding is more common if the patient is taking blood thinners such as  aspirin, Coumadin, Ticiid, Plavix, etc., or if he/she have some genetic predisposition  such as hemophilia. Bleeding into the spinal canal can cause compression of the spinal  cord with subsequent paralysis.  This would require an emergency surgery to  decompress and there are  no guarantees that the patient would recover from the  paralysis.       5. Pneumothorax:  Puncturing of a lung is a possibility, every time a needle is introduced in  the area of the chest or upper back.  Pneumothorax refers to free air around the  collapsed lung(s), inside of the thoracic cavity (chest cavity).  Another two possible  complications related to a similar event would include: Hemothorax and Chylothorax.   These are variations of the Pneumothorax, where instead of air around the collapsed  lung(s), you may have blood or chyle, respectively.       6. Spinal headaches: They may occur with any procedures in the area of the spine.       7. Persistent CSF (Cerebro-Spinal Fluid) leakage: This is a rare problem, but may occur  with prolonged intrathecal or epidural catheters either due to the formation of a fistulous  track or a dural tear.       8. Nerve damage: By working so close to the spinal  cord, there is always a possibility of  nerve damage, which could be as serious as a permanent spinal cord injury with  paralysis.       9. Death:  Although rare, severe deadly allergic reactions known as "Anaphylactic  reaction" can occur to any of the medications used.      10. Worsening of the symptoms:  We can always make thing worse.  What are the chances of something like this happening? Chances of any of this occuring are extremely low.  By statistics, you have more of a chance of getting killed in a motor vehicle accident: while driving to the hospital than any of the above occurring .  Nevertheless, you should be aware that they are possibilities.  In general, it is similar to taking a shower.  Everybody knows that you can slip, hit your head and get killed.  Does that mean that you should not shower again?  Nevertheless always keep in mind that statistics do not mean anything if you happen to be on the wrong side of them.  Even if a procedure has a 1 (one) in a 1,000,000 (million) chance of going wrong, it you happen to be that one..Also, keep in mind that by statistics, you have more of a chance of having something go wrong when taking medications.  Who should not have this procedure? If you are on a blood thinning medication (e.g. Coumadin, Plavix, see list of "Blood Thinners"), or if you have an active infection going on, you should not have the procedure.  If you are taking any blood thinners, please inform your physician.  How should I prepare for this procedure?  Do not eat or drink anything at least six hours prior to the procedure.  Bring a driver with you .  It cannot be a taxi.  Come accompanied by an adult that can drive you back, and that is strong enough to help you if your legs get weak or numb from the local anesthetic.  Take all of your medicines the morning of the procedure with just enough water to swallow them.  If you have diabetes, make sure that you are scheduled  to have your procedure done first thing in the morning, whenever possible.  If you have diabetes, take only half of your insulin dose and notify our nurse that you have done so as soon as you arrive at the clinic.  If you are diabetic, but  only take blood sugar pills (oral hypoglycemic), then do not take them on the morning of your procedure.  You may take them after you have had the procedure.  Do not take aspirin or any aspirin-containing medications, at least eleven (11) days prior to the procedure.  They may prolong bleeding.  Wear loose fitting clothing that may be easy to take off and that you would not mind if it got stained with Betadine or blood.  Do not wear any jewelry or perfume  Remove any nail coloring.  It will interfere with some of our monitoring equipment.  NOTE: Remember that this is not meant to be interpreted as a complete list of all possible complications.  Unforeseen problems may occur.  BLOOD THINNERS The following drugs contain aspirin or other products, which can cause increased bleeding during surgery and should not be taken for 2 weeks prior to and 1 week after surgery.  If you should need take something for relief of minor pain, you may take acetaminophen which is found in Tylenol,m Datril, Anacin-3 and Panadol. It is not blood thinner. The products listed below are.  Do not take any of the products listed below in addition to any listed on your instruction sheet.  A.P.C or A.P.C with Codeine Codeine Phosphate Capsules #3 Ibuprofen Ridaura  ABC compound Congesprin Imuran rimadil  Advil Cope Indocin Robaxisal  Alka-Seltzer Effervescent Pain Reliever and Antacid Coricidin or Coricidin-D  Indomethacin Rufen  Alka-Seltzer plus Cold Medicine Cosprin Ketoprofen S-A-C Tablets  Anacin Analgesic Tablets or Capsules Coumadin Korlgesic Salflex  Anacin Extra Strength Analgesic tablets or capsules CP-2 Tablets Lanoril Salicylate  Anaprox Cuprimine Capsules Levenox Salocol   Anexsia-D Dalteparin Magan Salsalate  Anodynos Darvon compound Magnesium Salicylate Sine-off  Ansaid Dasin Capsules Magsal Sodium Salicylate  Anturane Depen Capsules Marnal Soma  APF Arthritis pain formula Dewitt's Pills Measurin Stanback  Argesic Dia-Gesic Meclofenamic Sulfinpyrazone  Arthritis Bayer Timed Release Aspirin Diclofenac Meclomen Sulindac  Arthritis pain formula Anacin Dicumarol Medipren Supac  Analgesic (Safety coated) Arthralgen Diffunasal Mefanamic Suprofen  Arthritis Strength Bufferin Dihydrocodeine Mepro Compound Suprol  Arthropan liquid Dopirydamole Methcarbomol with Aspirin Synalgos  ASA tablets/Enseals Disalcid Micrainin Tagament  Ascriptin Doan's Midol Talwin  Ascriptin A/D Dolene Mobidin Tanderil  Ascriptin Extra Strength Dolobid Moblgesic Ticlid  Ascriptin with Codeine Doloprin or Doloprin with Codeine Momentum Tolectin  Asperbuf Duoprin Mono-gesic Trendar  Aspergum Duradyne Motrin or Motrin IB Triminicin  Aspirin plain, buffered or enteric coated Durasal Myochrisine Trigesic  Aspirin Suppositories Easprin Nalfon Trillsate  Aspirin with Codeine Ecotrin Regular or Extra Strength Naprosyn Uracel  Atromid-S Efficin Naproxen Ursinus  Auranofin Capsules Elmiron Neocylate Vanquish  Axotal Emagrin Norgesic Verin  Azathioprine Empirin or Empirin with Codeine Normiflo Vitamin E  Azolid Emprazil Nuprin Voltaren  Bayer Aspirin plain, buffered or children's or timed BC Tablets or powders Encaprin Orgaran Warfarin Sodium  Buff-a-Comp Enoxaparin Orudis Zorpin  Buff-a-Comp with Codeine Equegesic Os-Cal-Gesic   Buffaprin Excedrin plain, buffered or Extra Strength Oxalid   Bufferin Arthritis Strength Feldene Oxphenbutazone   Bufferin plain or Extra Strength Feldene Capsules Oxycodone with Aspirin   Bufferin with Codeine Fenoprofen Fenoprofen Pabalate or Pabalate-SF   Buffets II Flogesic Panagesic   Buffinol plain or Extra Strength Florinal or Florinal with Codeine  Panwarfarin   Buf-Tabs Flurbiprofen Penicillamine   Butalbital Compound Four-way cold tablets Penicillin   Butazolidin Fragmin Pepto-Bismol   Carbenicillin Geminisyn Percodan   Carna Arthritis Reliever Geopen Persantine   Carprofen Gold's salt Persistin   Chloramphenicol Goody's Phenylbutazone  Chloromycetin Haltrain Piroxlcam   Clmetidine heparin Plaquenil   Cllnoril Hyco-pap Ponstel   Clofibrate Hydroxy chloroquine Propoxyphen         Before stopping any of these medications, be sure to consult the physician who ordered them.  Some, such as Coumadin (Warfarin) are ordered to prevent or treat serious conditions such as "deep thrombosis", "pumonary embolisms", and other heart problems.  The amount of time that you may need off of the medication may also vary with the medication and the reason for which you were taking it.  If you are taking any of these medications, please make sure you notify your pain physician before you undergo any procedures.

## 2015-07-24 NOTE — Progress Notes (Signed)
Safety precautions to be maintained throughout the outpatient stay will include: orient to surroundings, keep bed in low position, maintain call bell within reach at all times, provide assistance with transfer out of bed and ambulation. Pill count 34/120 10 mg tablets filled on 07/04/2015

## 2015-07-24 NOTE — Patient Instructions (Signed)

## 2015-10-10 NOTE — Progress Notes (Signed)
Patient's Name: Kristopher [name]  Patient type: Established  MRN: [id number]  Service setting: Ambulatory outpatient  DOB: 04-08-1960  Location: [facility]  DOS: 10/11/2015  Primary Care Physician: Freddy Finner, NP  Note by: Kathlen Brunswick. Dossie Arbour, M.D  Referring Physician: Freddy Finner, NP  Specialty: Interventional Pain Management  Last Visit to Pain Management: 07/24/2015   Primary Reason(s) for Visit: Encounter for prescription drug management (Level of risk: moderate) CC: Back Pain (lower) and Neck Pain   HPI  Kristopher [name] is a 55 y.o. year old, male patient, who returns today as an established patient. 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); Chronic neck pain (posterior midline) (Bilateral) (L>R); Cervical spondylosis; Chronic cervical radicular pain (Location of Tertiary source of pain) (Bilateral) (C5/C6 dermatome) (L>R); Long term current use of opiate analgesic; Long term prescription opiate use; Opiate use (60 MME/Day); Opioid dependence ([place]); Encounter for therapeutic drug level monitoring; Cervical spinal stenosis (C4-5); Cervical foraminal stenosis (Bilateral C5-6); Hypokalemia; Retrolisthesis of L5-S1; Cervical disc herniation (C4-5 and C5-6); Lumbar disc herniation (L5-S1); Chronic sacroiliac joint pain (Bilateral) (L>R); Allergy history, anesthetic (Unconfirmed allergy to Lidocaine); Chronic hip pain (Left); Lumbar facet syndrome (Location of Primary Source of Pain) (Bilateral) (L>R); Chronic lower extremity pain (Location of Secondary source of pain) (Left); Chronic upper extremity pain (Location of Tertiary source of pain) (Bilateral) (L>R); and Greater occipital neuralgia (Right) on his problem list.. His primarily concern today is  the Back Pain (lower) and Neck Pain   Pain Assessment: Self-Reported Pain Score: 4              Reported level is compatible with observation       Pain Type: Chronic pain Pain Location: Neck Pain Orientation: Lower Pain Descriptors / Indicators: Stabbing Pain Frequency: Intermittent  The patient comes into the clinics today for pharmacological management of his chronic pain. I last saw this patient on 07/24/2015. The patient  reports that he does not use drugs. His body mass index is 29.41 kg/m.  Date of Last Visit: 07/24/15 Service Provided on Last Visit: Med Refill  Controlled Substance Pharmacotherapy Assessment & REMS (Risk Evaluation and Mitigation Strategy)  Analgesic: Oxycodone IR 10 mg every 6 hours (40 mg/day of oxycodone) MME/day: 60 mg/day Pill Count: Oxycodone 10 mg #77 out of 120 remaining. Filled 10-02-15. (This should last until 10/31/15). Pharmacokinetics: Onset of action (Liberation/Absorption): Within expected pharmacological parameters Time to Peak effect (Distribution): Timing and results are as within normal expected parameters Duration of action (Metabolism/Excretion): Within normal limits for medication Pharmacodynamics: Analgesic Effect: More than 50% Activity Facilitation: Medication(s) allow patient to sit, stand, walk, and do the basic ADLs Perceived Effectiveness: Described as relatively effective, allowing for increase in activities of daily living (ADL) Side-effects or Adverse reactions: None reported Monitoring: [place] PMP: Online review of the past 23-monthperiod conducted. Compliant with practice rules and regulations Last UDS on record: [employer]ox[employer] Select 13  Date Value Ref Range Status  05/31/2015 FINAL  Final    Comment:    ==================================================================== TOXASSURE SSELECT 37(MW) ==================================================================== Test                             Result       Flag  Units Drug Present and Declared for Prescription Verification   Oxycodone                      533          EXPECTED   ng/mg creat   Oxymorphone                    610          EXPECTED   ng/mg creat   Noroxycodone                   716          EXPECTED   ng/mg creat   Noroxymorphone                 128          EXPECTED   ng/mg creat    Sources of oxycodone are scheduled prescription medications.    Oxymorphone, noroxycodone, and noroxymorphone are expected    metabolites of oxycodone. Oxymorphone is also available as a    scheduled prescription medication. ==================================================================== Test                      Result    Flag   Units      Ref Range   Creatinine              135              mg/dL      >=20 ==================================================================== Declared Medications:  The flagging and interpretation on this report are based on the  following declared medications.  Unexpected results may arise from  inaccuracies in the declared medications.  **Note: The testing scope of this panel includes these medications:  Oxycodone  **Note: The testing scope of this panel does not include following  reported medications:  Amlodipine (Exforge)  Amlodipine (Norvasc)  Clindamycin (Cleocin)  Naloxone  Sumatriptan (Imitrex)  Valsartan (Exforge) ==================================================================== For clinical consultation, please call [phone]. ====================================================================    UDS interpretation: Compliant          Medication Assessment Form: Reviewed. Patient indicates being compliant with therapy Treatment compliance: Compliant Risk Assessment: Aberrant Behavior: None observed today Substance Use Disorder (SUD) Risk Level: Low-to-moderate Risk of opioid abuse or dependence: 0.7-3.0% with doses ? 36 MME/day and 6.1-26% with doses ? 120 MME/day. Opioid Risk Tool  (ORT) Score: Total Score: 0 Low Risk for SUD (Score <3) Depression Scale Score: PHQ-2: PHQ-2 Total Score: 0 No depression (0) PHQ-9: PHQ-9 Total Score: 0 No depression (0-4)  Pharmacologic Plan: No change in therapy, at this time  Laboratory Chemistry  Inflammation Markers Lab Results  Component Value Date   ESRSEDRATE 11 04/03/2015   CRP 0.7 04/03/2015    Renal Function Lab Results  Component Value Date   BUN 12 04/03/2015   CREATININE 0.83 04/03/2015   GFRAA >60 04/03/2015   GFRNONAA >60 04/03/2015    Hepatic Function Lab Results  Component Value Date   AST 46 (H) 04/03/2015   ALT 77 (H) 04/03/2015   ALBUMIN 4.6 04/03/2015    Electrolytes Lab Results  Component Value Date   NA 139 04/03/2015   K 4.0 04/03/2015   CL 106 04/03/2015   CALCIUM 9.4 04/03/2015   MG 2.0 04/03/2015    Pain Modulating Vitamins No results found for: VD25OH, ZE092ZR0QTM, AU6333LK5, GY5638LH7, 25OHVITD1, 25OHVITD2, 25OHVITD3, VITAMINB12  Coagulation Parameters Lab Results  Component Value Date  PLT 161 01/28/2015    Cardiovascular Lab Results  Component Value Date   HGB 13.9 01/28/2015   HCT 40.6 01/28/2015    Note: Lab results reviewed.  Recent Diagnostic Imaging  Dg Lumbar Spine Complete W/bend  Result Date: 06/06/2015 CLINICAL DATA:  Fall.  Chronic pain.  Initial evaluation . EXAM: LUMBAR SPINE - COMPLETE WITH BENDING VIEWS COMPARISON:  No prior. FINDINGS: Prior anterior interbody L5-S1 fusion with good anatomic alignment. No acute bony abnormality identified. No flexion or extension deformity noted. IMPRESSION: Prior anterior and interbody L5-S1 fusion with good anatomic alignment.Diffuse multilevel degenerative change. No acute abnormality. Electronically Signed   By: Marcello Moores  Register   On: 06/06/2015 13:14   Dg Si Joints  Result Date: 06/06/2015 CLINICAL DATA:  [place] foot off a roof in 2011, chronic low back pain radiating to LEFT hip since, multiple back surgeries  since fall, chronic SI joint pain EXAM: BILATERAL SACROILIAC JOINTS - 3+ VIEW COMPARISON:  None FINDINGS: SI joints symmetric and preserved. Prior L5-S1 fusion. Sacral foramina grossly symmetric. No fracture or bone destruction. Spur formation at LEFT femoral head. IMPRESSION: Prior L5-S1 fusion. Degenerative changes LEFT hip. Unremarkable SI joints. Electronically Signed   By: Lavonia Dana M.D.   On: 06/06/2015 13:13   Dg Hip Unilat W Or W/o Pelvis 2-3 Views Left  Result Date: 06/06/2015 CLINICAL DATA:  [place] 28 foot off a roof in 2011, chronic low back pain radiating to LEFT hip since, multiple back surgeries since fall EXAM: DG HIP (WITH OR WITHOUT PELVIS) 2-3V LEFT COMPARISON:  Non FINDINGS: Osseous mineralization grossly normal. Prior L5-S1 fusion. SI joints symmetric and preserved. Degenerative changes of both hip joints with joint space narrowing and spur formation greater on LEFT. No acute fracture, dislocation, or bone destruction. IMPRESSION: Degenerative changes of BILATERAL hip joints. Electronically Signed   By: Lavonia Dana M.D.   On: 06/06/2015 13:14    Meds  The patient has a current medication list which includes the following prescription(s): amlodipine-valsartan, naloxone hcl, NONFORMULARY OR COMPOUNDED ITEM, sumatriptan, NONFORMULARY OR COMPOUNDED ITEM, oxycodone hcl, oxycodone hcl, and oxycodone hcl.  Current Outpatient Prescriptions on File Prior to Visit  Medication Sig  . Naloxone HCl (NARCAN) 4 MG/0.1ML LIQD Place 1 Bottle into the nose once. , then call 911, repeat if needed in other nostril with new bottle.  . SUMAtriptan (IMITREX) 50 MG tablet Take 50 mg by mouth every 2 (two) hours as needed for migraine. May repeat in 2 hours if headache persists or recurs.   No current facility-administered medications on file prior to visit.     ROS  Constitutional: Denies any fever or chills Gastrointestinal: No reported hemesis, hematochezia, vomiting, or acute GI  distress Musculoskeletal: Denies any acute onset joint swelling, redness, loss of ROM, or weakness Neurological: No reported episodes of acute onset apraxia, aphasia, dysarthria, agnosia, amnesia, paralysis, loss of coordination, or loss of consciousness  Allergies  Mr. Lederman is allergic to lidocaine; penicillins; and strawberry (diagnostic).  [place]  Medical:  Mr. Zanni  has a past medical history of Allergy and Hypertension. Family: family history includes Cancer in his father. Surgical:  has a past surgical history that includes Back surgery; Appendectomy; and Leg Surgery. Tobacco:  reports that he has been smoking Cigarettes.  He has a 17.50 pack-year smoking history. He does not have any smokeless tobacco history on file. Alcohol:  reports that he does not drink alcohol. Drug:  reports that he does not use drugs.  Constitutional Exam  Vitals: Blood pressure (!) 160/92, pulse 68, temperature 97.9 F (36.6 C), temperature source Oral, resp. rate 16, height '5\' 10"'$  (1.778 m), weight 205 lb (93 kg), SpO2 100 %. General appearance: Well nourished, well developed, and well hydrated. In no acute distress Calculated BMI/Body habitus: Body mass index is 29.41 kg/m. (25-29.9 kg/m2) Overweight - 20% higher incidence of chronic pain Psych/Mental status: Alert and oriented x 3 (person, place, & time) Eyes: PERLA Respiratory: No evidence of acute respiratory distress  Cervical Spine Exam  Inspection: No masses, redness, or swelling Alignment: Symmetrical Functional ROM: ROM appears unrestricted Stability: No instability detected Muscle strength & Tone: Functionally intact Sensory: Unimpaired Palpation: Non-contributory  Upper Extremity (UE) Exam    Side: Right upper extremity  Side: Left upper extremity  Inspection: No masses, redness, swelling, or asymmetry  Inspection: No masses, redness, swelling, or asymmetry  Functional ROM: ROM appears unrestricted  Functional ROM: ROM appears  unrestricted  Muscle strength & Tone: Functionally intact  Muscle strength & Tone: Functionally intact  Sensory: Unimpaired  Sensory: Unimpaired  Palpation: Non-contributory  Palpation: Non-contributory   Thoracic Spine Exam  Inspection: No masses, redness, or swelling Alignment: Symmetrical Functional ROM: ROM appears unrestricted Stability: No instability detected Sensory: Unimpaired Muscle strength & Tone: Functionally intact Palpation: Non-contributory  Lumbar Spine Exam  Inspection: No masses, redness, or swelling Alignment: Symmetrical Functional ROM: ROM appears unrestricted Stability: No instability detected Muscle strength & Tone: Functionally intact Sensory: Unimpaired Palpation: Non-contributory Provocative Tests: Lumbar Hyperextension and rotation test: evaluation deferred today       Patrick's Maneuver: evaluation deferred today              Gait & Posture Assessment  Ambulation: Unassisted Gait: Relatively normal for age and body habitus Posture: WNL   Lower Extremity Exam    Side: Right lower extremity  Side: Left lower extremity  Inspection: No masses, redness, swelling, or asymmetry  Inspection: No masses, redness, swelling, or asymmetry  Functional ROM: ROM appears unrestricted  Functional ROM: ROM appears unrestricted  Muscle strength & Tone: Functionally intact  Muscle strength & Tone: Functionally intact  Sensory: Unimpaired  Sensory: Unimpaired  Palpation: Non-contributory  Palpation: Non-contributory    Assessment & Plan  Primary Diagnosis & Pertinent Problem List: The primary encounter diagnosis was Chronic pain. Diagnoses of Long term current use of opiate analgesic, Opiate use (60 MME/Day), Chronic low back pain (Location of Primary Source of Pain) (Bilateral) (L>R), and Chronic lower extremity pain (Location of Secondary source of pain) (Left) were also pertinent to this visit.  Visit Diagnosis: 1. Chronic pain   2. Long term current use of  opiate analgesic   3. Opiate use (60 MME/Day)   4. Chronic low back pain (Location of Primary Source of Pain) (Bilateral) (L>R)   5. Chronic lower extremity pain (Location of Secondary source of pain) (Left)     Problems updated and reviewed during this visit: No problems updated.  Problem-specific Plan(s): No problem-specific Assessment & Plan notes found for this encounter.  No new Assessment & Plan notes have been filed under this hospital service since the last note was generated. Service: Pain Management   Plan of Care   Problem List Items Addressed This Visit      High   Chronic low back pain (Location of Primary Source of Pain) (Bilateral) (L>R) (Chronic)   Relevant Medications   Oxycodone HCl 10 MG TABS   Oxycodone HCl 10 MG TABS   Oxycodone HCl 10 MG TABS  Chronic lower extremity pain (Location of Secondary source of pain) (Left) (Chronic)   Relevant Medications   Oxycodone HCl 10 MG TABS   Oxycodone HCl 10 MG TABS   Oxycodone HCl 10 MG TABS   Chronic pain - Primary (Chronic)   Relevant Medications   Oxycodone HCl 10 MG TABS   Oxycodone HCl 10 MG TABS   Oxycodone HCl 10 MG TABS   NONFORMULARY OR COMPOUNDED ITEM     Medium   Long term current use of opiate analgesic (Chronic)   Opiate use (60 MME/Day) (Chronic)    Other Visit Diagnoses   None.      Pharmacotherapy (Medications Ordered): Meds ordered this encounter  Medications  . Oxycodone HCl 10 MG TABS    Sig: Take 1 tablet (10 mg total) by mouth every 6 (six) hours as needed.    Dispense:  120 tablet    Refill:  0    Do not place this medication, or any other prescription from our practice, on "Automatic Refill". Patient may have prescription filled one day early if pharmacy is closed on scheduled refill date. Do not fill until: 11/01/15 To last until: 12/01/15  . Oxycodone HCl 10 MG TABS    Sig: Take 1 tablet (10 mg total) by mouth every 6 (six) hours as needed.    Dispense:  120 tablet     Refill:  0    Do not place this medication, or any other prescription from our practice, on "Automatic Refill". Patient may have prescription filled one day early if pharmacy is closed on scheduled refill date. Do not fill until: 12/01/15 To last until: 12/31/15  . Oxycodone HCl 10 MG TABS    Sig: Take 1 tablet (10 mg total) by mouth every 6 (six) hours as needed.    Dispense:  120 tablet    Refill:  0    Do not place this medication, or any other prescription from our practice, on "Automatic Refill". Patient may have prescription filled one day early if pharmacy is closed on scheduled refill date. Do not fill until: 12/31/15 To last until: 01/30/16  . NONFORMULARY OR COMPOUNDED ITEM    Sig: 10% Ketamine/2% Cyclobenzaprine/6% Gabapentin Cream Sig: 1-2 ml to affected area 3-4 times/day. Amount: 240 GM    Dispense:  240 each    Refill:  2    Lab-work & Procedure Ordered: No orders of the defined types were placed in this encounter.   Imaging Ordered: None  Interventional Therapies: Scheduled:  None at this time.    Considering:  Possible diagnostic bilateral intra-articular hip joint injections.    PRN Procedures:  Diagnostic bilateral intra-articular hip joint injection under fluoroscopic guidance with or without sedation.    Referral(s) or Consult(s): None at this time.  New Prescriptions   NONFORMULARY OR COMPOUNDED ITEM    10% Ketamine/2% Cyclobenzaprine/6% Gabapentin Cream Sig: 1-2 ml to affected area 3-4 times/day. Amount: 240 GM    Medications administered during this visit: Mr. Lingard had no medications administered during this visit.  Requested PM Follow-up: Return in 3 months (on 01/10/2016) for Med-Mgmt.  Future Appointments Date Time Provider Kathleen  01/10/2016 10:00 AM [name], MD [facility] None    Primary Care Physician: Freddy Finner, NP Location: [facility] Note by: Beatriz Chancellor A. Dossie Arbour, M.D, DABA,  DABAPM, DABPM, DABIPP, FIPP  Pain Score Disclaimer: We use the NRS-11 scale. This is a self-reported, subjective measurement of pain severity with only modest accuracy. It  is used primarily to identify changes within a particular patient. It must be understood that outpatient pain scales are significantly less accurate that those used for research, where they can be applied under ideal controlled circumstances with minimal exposure to variables. In reality, the score is likely to be a combination of pain intensity and pain affect, where pain affect describes the degree of emotional arousal or changes in action readiness caused by the sensory experience of pain. Factors such as social and work situation, setting, emotional state, anxiety levels, expectation, and prior pain experience may influence pain perception and show large inter-individual differences that may also be affected by time variables.  Patient instructions provided during this appointment: There are no Patient Instructions on file for this visit.

## 2015-10-11 ENCOUNTER — Encounter: Payer: Self-pay | Admitting: Pain Medicine

## 2015-10-11 ENCOUNTER — Ambulatory Visit: Payer: Medicare Other | Attending: Pain Medicine | Admitting: Pain Medicine

## 2015-10-11 VITALS — BP 160/92 | HR 68 | Temp 97.9°F | Resp 16 | Ht 70.0 in | Wt 205.0 lb

## 2015-10-11 DIAGNOSIS — F1190 Opioid use, unspecified, uncomplicated: Secondary | ICD-10-CM

## 2015-10-11 DIAGNOSIS — M4722 Other spondylosis with radiculopathy, cervical region: Secondary | ICD-10-CM | POA: Diagnosis not present

## 2015-10-11 DIAGNOSIS — M545 Low back pain, unspecified: Secondary | ICD-10-CM

## 2015-10-11 DIAGNOSIS — G8929 Other chronic pain: Secondary | ICD-10-CM

## 2015-10-11 DIAGNOSIS — M5481 Occipital neuralgia: Secondary | ICD-10-CM | POA: Diagnosis not present

## 2015-10-11 DIAGNOSIS — M533 Sacrococcygeal disorders, not elsewhere classified: Secondary | ICD-10-CM | POA: Diagnosis not present

## 2015-10-11 DIAGNOSIS — M79605 Pain in left leg: Secondary | ICD-10-CM | POA: Diagnosis not present

## 2015-10-11 DIAGNOSIS — M25552 Pain in left hip: Secondary | ICD-10-CM | POA: Insufficient documentation

## 2015-10-11 DIAGNOSIS — M4316 Spondylolisthesis, lumbar region: Secondary | ICD-10-CM | POA: Insufficient documentation

## 2015-10-11 DIAGNOSIS — Z981 Arthrodesis status: Secondary | ICD-10-CM | POA: Insufficient documentation

## 2015-10-11 DIAGNOSIS — Z9889 Other specified postprocedural states: Secondary | ICD-10-CM | POA: Diagnosis not present

## 2015-10-11 DIAGNOSIS — Z79891 Long term (current) use of opiate analgesic: Secondary | ICD-10-CM

## 2015-10-11 DIAGNOSIS — E876 Hypokalemia: Secondary | ICD-10-CM | POA: Diagnosis not present

## 2015-10-11 DIAGNOSIS — M50221 Other cervical disc displacement at C4-C5 level: Secondary | ICD-10-CM | POA: Insufficient documentation

## 2015-10-11 DIAGNOSIS — F17210 Nicotine dependence, cigarettes, uncomplicated: Secondary | ICD-10-CM | POA: Diagnosis not present

## 2015-10-11 DIAGNOSIS — M4726 Other spondylosis with radiculopathy, lumbar region: Secondary | ICD-10-CM | POA: Insufficient documentation

## 2015-10-11 DIAGNOSIS — M4802 Spinal stenosis, cervical region: Secondary | ICD-10-CM | POA: Diagnosis not present

## 2015-10-11 DIAGNOSIS — M542 Cervicalgia: Secondary | ICD-10-CM | POA: Diagnosis present

## 2015-10-11 MED ORDER — OXYCODONE HCL 10 MG PO TABS: 10.0000 mg | ORAL_TABLET | Freq: Four times a day (QID) | ORAL | 0 refills | Status: DC | PRN

## 2015-10-11 MED ORDER — NONFORMULARY OR COMPOUNDED ITEM: 2 refills | Status: DC

## 2015-10-11 NOTE — Progress Notes (Signed)
Safety precautions to be maintained throughout the outpatient stay will include: orient to surroundings, keep bed in low position, maintain call bell within reach at all times, provide assistance with transfer out of bed and ambulation.   Oxycodone 10 mg #77 out of 120 remaining. Filled 10-02-15.

## 2016-01-10 ENCOUNTER — Encounter (INDEPENDENT_AMBULATORY_CARE_PROVIDER_SITE_OTHER): Payer: Self-pay

## 2016-01-10 ENCOUNTER — Encounter: Payer: Medicare Other | Admitting: Pain Medicine

## 2016-01-11 ENCOUNTER — Ambulatory Visit: Payer: Medicare Other | Attending: Pain Medicine | Admitting: Pain Medicine

## 2016-01-11 ENCOUNTER — Encounter: Payer: Self-pay | Admitting: Pain Medicine

## 2016-01-11 VITALS — BP 150/97 | HR 91 | Temp 98.5°F | Resp 18 | Ht 70.0 in | Wt 225.0 lb

## 2016-01-11 DIAGNOSIS — M542 Cervicalgia: Secondary | ICD-10-CM | POA: Diagnosis not present

## 2016-01-11 DIAGNOSIS — Z79891 Long term (current) use of opiate analgesic: Secondary | ICD-10-CM | POA: Diagnosis not present

## 2016-01-11 DIAGNOSIS — F1190 Opioid use, unspecified, uncomplicated: Secondary | ICD-10-CM | POA: Diagnosis not present

## 2016-01-11 DIAGNOSIS — M79605 Pain in left leg: Secondary | ICD-10-CM | POA: Diagnosis not present

## 2016-01-11 DIAGNOSIS — M5441 Lumbago with sciatica, right side: Secondary | ICD-10-CM

## 2016-01-11 DIAGNOSIS — G8929 Other chronic pain: Secondary | ICD-10-CM

## 2016-01-11 DIAGNOSIS — F17210 Nicotine dependence, cigarettes, uncomplicated: Secondary | ICD-10-CM | POA: Diagnosis not present

## 2016-01-11 DIAGNOSIS — M5416 Radiculopathy, lumbar region: Secondary | ICD-10-CM

## 2016-01-11 DIAGNOSIS — I10 Essential (primary) hypertension: Secondary | ICD-10-CM | POA: Diagnosis not present

## 2016-01-11 DIAGNOSIS — M5442 Lumbago with sciatica, left side: Secondary | ICD-10-CM | POA: Diagnosis not present

## 2016-01-11 DIAGNOSIS — G894 Chronic pain syndrome: Secondary | ICD-10-CM

## 2016-01-11 DIAGNOSIS — Z91018 Allergy to other foods: Secondary | ICD-10-CM | POA: Insufficient documentation

## 2016-01-11 DIAGNOSIS — Z880 Allergy status to penicillin: Secondary | ICD-10-CM | POA: Diagnosis not present

## 2016-01-11 DIAGNOSIS — Z884 Allergy status to anesthetic agent status: Secondary | ICD-10-CM

## 2016-01-11 MED ORDER — NONFORMULARY OR COMPOUNDED ITEM: 2 refills | Status: DC

## 2016-01-11 MED ORDER — OXYCODONE HCL 10 MG PO TABS: 10.0000 mg | ORAL_TABLET | Freq: Four times a day (QID) | ORAL | 0 refills | Status: DC | PRN

## 2016-01-11 NOTE — Progress Notes (Signed)
Patient's Name: Kristopher [name]  MRN: [id number]  Referring Provider: Freddy Finner, NP  DOB: 09-09-1960  PCP: Freddy Finner, NP  DOS: 01/11/2016  Note by: Kathlen Brunswick. Dossie Arbour, MD  Service setting: Ambulatory outpatient  Specialty: Interventional Pain Management  Location: [facility] (AMB) Pain Management Facility    Patient type: Established   Primary Reason(s) for Visit: Encounter for prescription drug management (Level of risk: moderate) CC: Back Pain (lower); Neck Pain (lower mid area); and Leg Pain (left all the way down )  HPI  Kristopher [name] is a 55 y.o. year old, male patient, who comes today for a medication management evaluation. He has Chronic low back pain (Location of Primary Source of Pain) (Bilateral) (L>R); Lumbar spondylosis; Chronic lumbar radicular pain (Location of Secondary source of pain) (S1 dermatomal) (Left); 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); Chronic neck pain (posterior midline) (Bilateral) (L>R); Cervical spondylosis; Chronic cervical radicular pain (Location of Tertiary source of pain) (Bilateral) (C5/C6 dermatome) (L>R); Long term current use of opiate analgesic; Long term prescription opiate use; Opiate use (60 MME/Day); Opioid dependence ([place]); Encounter for therapeutic drug level monitoring; Cervical spinal stenosis (C4-5); Cervical foraminal stenosis (Bilateral C5-6); Hypokalemia; Retrolisthesis of L5-S1; Cervical disc herniation (C4-5 and C5-6); Lumbar disc herniation (L5-S1); Chronic sacroiliac joint pain (Bilateral) (L>R); Allergy history, anesthetic (Unconfirmed allergy to Lidocaine); Chronic hip pain (Left); Lumbar facet syndrome (Location of Primary Source of Pain) (Bilateral) (L>R); Chronic lower extremity pain (Location of Secondary source of pain) (Left); Chronic upper extremity pain (Location of Tertiary source of pain) (Bilateral) (L>R); Greater occipital neuralgia (Right); and Chronic pain syndrome on his problem list. His primarily concern  today is the Back Pain (lower); Neck Pain (lower mid area); and Leg Pain (left all the way down )  Pain Assessment: Self-Reported Pain Score: 6 /10 Clinically the patient looks like a 3/10 Reported level is inconsistent with clinical observations. Information on the proper use of the pain score provided to the patient today. Pain Type: Chronic pain Pain Location: Back Pain Orientation: Lower Pain Descriptors / Indicators: Stabbing Pain Frequency: Intermittent  Mr. Trebilcock was last seen on 10/11/2015 for medication management. During today's appointment we reviewed Mr. Neuman chronic pain status, as well as his outpatient medication regimen. The patient  reports that he does not use drugs. His body mass index is 32.28 kg/m.   Further details on both, my assessment(s), as well as the proposed treatment plan, please see below.  Controlled Substance Pharmacotherapy Assessment REMS (Risk Evaluation and Mitigation Strategy)  Analgesic:Oxycodone IR 10 mg every 6 hours (40 mg/day of oxycodone) MME/day:60 mg/day  Azalee Course, RN  01/11/2016  9:54 AM  Sign at close encounter Nursing Pain Medication Assessment:  Safety precautions to be maintained throughout the outpatient stay will include: orient to surroundings, keep bed in low position, maintain call bell within reach at all times, provide assistance with transfer out of bed and ambulation.  Medication Inspection Compliance: Pill count conducted under aseptic conditions, in front of the patient. Neither the pills nor the bottle was removed from the patient's sight at any time. Once count was completed pills were immediately returned to the patient in their original bottle.  Medication: See above Pill Count: 10 of 120 pills remain Bottle Appearance: Standard pharmacy container. Clearly labeled. Filled Date: 62 / 29 / 2017 Medication last intake:01/11/16 745am  Pt states " I was trying to open my pill bottle  On the deck ,and I fell,and I  lost some of the  pills in the yard ,and they were wet ,and I couldn't pick them up. Laast week.   Pharmacokinetics: Liberation and absorption (onset of action): WNL Distribution (time to peak effect): WNL Metabolism and excretion (duration of action): WNL         Pharmacodynamics: Desired effects: Analgesia: The patient reports >50% benefit. Reported improvement in function: The patient reports medication allows him to accomplish basic ADLs. Clinically meaningful improvement in function (CMIF): Sustained CMIF goals met Perceived effectiveness: Described as relatively effective, allowing for increase in activities of daily living (ADL) Undesirable effects: Side-effects or Adverse reactions: None reported Monitoring: [place] PMP: Online review of the past 61-monthperiod conducted. Compliant with practice rules and regulations List of all UDS test(s) done:  Lab Results  Component Value Date   TOXASSSELUR FINAL 05/31/2015   TOXASSSELUR FINAL 04/03/2015   TOXASSSELUR FINAL 02/01/2015   TOXASSSELUR FINAL 01/05/2015   Last UDS on record: ToxAssure Select 13  Date Value Ref Range Status  05/31/2015 FINAL  Final    Comment:    ==================================================================== TOXASSURE SELECT 13 (MW) ==================================================================== Test                             Result       Flag       Units Drug Present and Declared for Prescription Verification   Oxycodone                      533          EXPECTED   ng/mg creat   Oxymorphone                    610          EXPECTED   ng/mg creat   Noroxycodone                   716          EXPECTED   ng/mg creat   Noroxymorphone                 128          EXPECTED   ng/mg creat    Sources of oxycodone are scheduled prescription medications.    Oxymorphone, noroxycodone, and noroxymorphone are expected    metabolites of oxycodone. Oxymorphone is also available as a    scheduled prescription  medication. ==================================================================== Test                      Result    Flag   Units      Ref Range   Creatinine              135              mg/dL      >=20 ==================================================================== Declared Medications:  The flagging and interpretation on this report are based on the  following declared medications.  Unexpected results may arise from  inaccuracies in the declared medications.  **Note: The testing scope of this panel includes these medications:  Oxycodone  **Note: The testing scope of this panel does not include following  reported medications:  Amlodipine (Exforge)  Amlodipine (Norvasc)  Clindamycin (Cleocin)  Naloxone  Sumatriptan (Imitrex)  Valsartan (Exforge) ==================================================================== For clinical consultation, please call [phone] ====================================================================    UDS interpretation: Compliant          Medication Assessment Form: Reviewed.  Patient indicates being compliant with therapy Treatment compliance: Compliant Risk Assessment Profile: Aberrant behavior: See prior evaluations. None observed or detected today Comorbid factors increasing risk of overdose: See prior notes. No additional risks detected today Risk of substance use disorder (SUD): Low Opioid Risk Tool (ORT) Total Score: 0  Interpretation Table:  Score <3 = Low Risk for SUD  Score between 4-7 = Moderate Risk for SUD  Score >8 = High Risk for Opioid Abuse   Risk Mitigation Strategies:  Patient Counseling: Covered Patient-Prescriber Agreement (PPA): Present and active  Notification to other healthcare providers: Done  Pharmacologic Plan: No change in therapy, at this time  Laboratory Chemistry  Inflammation Markers Lab Results  Component Value Date   ESRSEDRATE 11 04/03/2015   CRP 0.7 04/03/2015   Renal Function Lab  Results  Component Value Date   BUN 12 04/03/2015   CREATININE 0.83 04/03/2015   GFRAA >60 04/03/2015   GFRNONAA >60 04/03/2015   Hepatic Function Lab Results  Component Value Date   AST 46 (H) 04/03/2015   ALT 77 (H) 04/03/2015   ALBUMIN 4.6 04/03/2015   Electrolytes Lab Results  Component Value Date   NA 139 04/03/2015   K 4.0 04/03/2015   CL 106 04/03/2015   CALCIUM 9.4 04/03/2015   MG 2.0 04/03/2015   Pain Modulating Vitamins No results found for: Maralyn Sago [id number], [id number], [id number], 25OHVITD1, 25OHVITD2, 25OHVITD3, VITAMINB12 Coagulation Parameters Lab Results  Component Value Date   PLT 161 01/28/2015   Cardiovascular Lab Results  Component Value Date   HGB 13.9 01/28/2015   HCT 40.6 01/28/2015   Note: Lab results reviewed.  Recent Diagnostic Imaging Review  Dg Lumbar Spine Complete W/bend  Result Date: 06/06/2015 CLINICAL DATA:  Fall.  Chronic pain.  Initial evaluation . EXAM: LUMBAR SPINE - COMPLETE WITH BENDING VIEWS COMPARISON:  No prior. FINDINGS: Prior anterior interbody L5-S1 fusion with good anatomic alignment. No acute bony abnormality identified. No flexion or extension deformity noted. IMPRESSION: Prior anterior and interbody L5-S1 fusion with good anatomic alignment.Diffuse multilevel degenerative change. No acute abnormality. Electronically Signed   By: Marcello Moores  Register   On: 06/06/2015 13:14   Dg Si Joints  Result Date: 06/06/2015 CLINICAL DATA:  [place] foot off a roof in 2011, chronic low back pain radiating to LEFT hip since, multiple back surgeries since fall, chronic SI joint pain EXAM: BILATERAL SACROILIAC JOINTS - 3+ VIEW COMPARISON:  None FINDINGS: SI joints symmetric and preserved. Prior L5-S1 fusion. Sacral foramina grossly symmetric. No fracture or bone destruction. Spur formation at LEFT femoral head. IMPRESSION: Prior L5-S1 fusion. Degenerative changes LEFT hip. Unremarkable SI joints. Electronically Signed   By: Lavonia Dana M.D.   On:  06/06/2015 13:13   Dg Hip Unilat W Or W/o Pelvis 2-3 Views Left  Result Date: 06/06/2015 CLINICAL DATA:  [place] 28 foot off a roof in 2011, chronic low back pain radiating to LEFT hip since, multiple back surgeries since fall EXAM: DG HIP (WITH OR WITHOUT PELVIS) 2-3V LEFT COMPARISON:  Non FINDINGS: Osseous mineralization grossly normal. Prior L5-S1 fusion. SI joints symmetric and preserved. Degenerative changes of both hip joints with joint space narrowing and spur formation greater on LEFT. No acute fracture, dislocation, or bone destruction. IMPRESSION: Degenerative changes of BILATERAL hip joints. Electronically Signed   By: Lavonia Dana M.D.   On: 06/06/2015 13:14   Note: Imaging results reviewed.  Meds  The patient has a current medication list which includes the following prescription(s): amlodipine-valsartan, naloxone  hcl, NONFORMULARY OR COMPOUNDED ITEM, oxycodone hcl, oxycodone hcl, oxycodone hcl, and sumatriptan.  Current Outpatient Prescriptions on File Prior to Visit  Medication Sig  . amLODipine-valsartan (EXFORGE) 10-320 MG tablet Take by mouth daily.   . Naloxone HCl (NARCAN) 4 MG/0.1ML LIQD Place 1 Bottle into the nose once. , then call 911, repeat if needed in other nostril with new bottle.  . SUMAtriptan (IMITREX) 50 MG tablet Take 50 mg by mouth every 2 (two) hours as needed for migraine. May repeat in 2 hours if headache persists or recurs.   No current facility-administered medications on file prior to visit.    ROS  Constitutional: Denies any fever or chills Gastrointestinal: No reported hemesis, hematochezia, vomiting, or acute GI distress Musculoskeletal: Denies any acute onset joint swelling, redness, loss of ROM, or weakness Neurological: No reported episodes of acute onset apraxia, aphasia, dysarthria, agnosia, amnesia, paralysis, loss of coordination, or loss of consciousness  Allergies  Mr. Shatzer is allergic to lidocaine; penicillins; and strawberry  (diagnostic).  [place]  Drug: Mr. Parmer  reports that he does not use drugs. Alcohol:  reports that he does not drink alcohol. Tobacco:  reports that he has been smoking Cigarettes.  He has a 17.50 pack-year smoking history. He does not have any smokeless tobacco history on file. Medical:  has a past medical history of Allergy and Hypertension. Family: family history includes Cancer in his father.  Past Surgical History:  Procedure Laterality Date  . APPENDECTOMY    . BACK SURGERY    . LEG SURGERY     Constitutional Exam  General appearance: Well nourished, well developed, and well hydrated. In no apparent acute distress Vitals:   01/11/16 0940  BP: (!) 150/97  Pulse: 91  Resp: 18  Temp: 98.5 F (36.9 C)  TempSrc: Oral  SpO2: 99%  Weight: 225 lb (102.1 kg)  Height: _0  (1.778 m)   BMI Assessment: Estimated body mass index is 32.28 kg/m as calculated from the following:   Height as of this encounter: _1  (1.778 m).   Weight as of this encounter: 225 lb (102.1 kg).  BMI interpretation table: BMI level Category Range association with higher incidence of chronic pain  <18 kg/m2 Underweight   18.5-24.9 kg/m2 Ideal body weight   25-29.9 kg/m2 Overweight Increased incidence by 20%  30-34.9 kg/m2 Obese (Class I) Increased incidence by 68%  35-39.9 kg/m2 Severe obesity (Class II) Increased incidence by 136%  >40 kg/m2 Extreme obesity (Class III) Increased incidence by 254%   BMI Readings from Last 4 Encounters:  01/11/16 32.28 kg/m  10/11/15 29.41 kg/m  07/24/15 28.70 kg/m  06/06/15 31.57 kg/m   Wt Readings from Last 4 Encounters:  01/11/16 225 lb (102.1 kg)  10/11/15 205 lb (93 kg)  07/24/15 200 lb (90.7 kg)  06/06/15 220 lb (99.8 kg)  Psych/Mental status: Alert, oriented x 3 (person, place, & time) Eyes: PERLA Respiratory: No evidence of acute respiratory distress  Cervical Spine Exam  Inspection: No masses, redness, or swelling Alignment:  Symmetrical Functional ROM: Unrestricted ROM Stability: No instability detected Muscle strength & Tone: Functionally intact Sensory: Unimpaired Palpation: Non-contributory  Upper Extremity (UE) Exam    Side: Right upper extremity  Side: Left upper extremity  Inspection: No masses, redness, swelling, or asymmetry  Inspection: No masses, redness, swelling, or asymmetry  Functional ROM: Unrestricted ROM         Functional ROM: Unrestricted ROM          Muscle strength &  Tone: Functionally intact  Muscle strength & Tone: Functionally intact  Sensory: Unimpaired  Sensory: Unimpaired  Palpation: Non-contributory  Palpation: Non-contributory   Thoracic Spine Exam  Inspection: No masses, redness, or swelling Alignment: Symmetrical Functional ROM: Unrestricted ROM Stability: No instability detected Sensory: Unimpaired Muscle strength & Tone: Functionally intact Palpation: Non-contributory  Lumbar Spine Exam  Inspection: No masses, redness, or swelling Alignment: Symmetrical Functional ROM: Unrestricted ROM Stability: No instability detected Muscle strength & Tone: Functionally intact Sensory: Unimpaired Palpation: Non-contributory Provocative Tests: Lumbar Hyperextension and rotation test: evaluation deferred today       Patrick's Maneuver: evaluation deferred today              Gait & Posture Assessment  Ambulation: Unassisted Gait: Relatively normal for age and body habitus Posture: WNL   Lower Extremity Exam    Side: Right lower extremity  Side: Left lower extremity  Inspection: No masses, redness, swelling, or asymmetry  Inspection: No masses, redness, swelling, or asymmetry  Functional ROM: Unrestricted ROM          Functional ROM: Unrestricted ROM          Muscle strength & Tone: Functionally intact  Muscle strength & Tone: Functionally intact  Sensory: Unimpaired  Sensory: Unimpaired  Palpation: Non-contributory  Palpation: Non-contributory   Assessment  Primary  Diagnosis & Pertinent Problem List: The primary encounter diagnosis was Chronic pain syndrome. Diagnoses of Chronic low back pain (Location of Primary Source of Pain) (Bilateral) (L>R), Long term current use of opiate analgesic, Opiate use (60 MME/Day), Chronic lumbar radicular pain (Location of Secondary source of pain) (S1 dermatomal) (Left), and Allergy history, anesthetic (Unconfirmed allergy to Lidocaine) were also pertinent to this visit.  Visit Diagnosis: 1. Chronic pain syndrome   2. Chronic low back pain (Location of Primary Source of Pain) (Bilateral) (L>R)   3. Long term current use of opiate analgesic   4. Opiate use (60 MME/Day)   5. Chronic lumbar radicular pain (Location of Secondary source of pain) (S1 dermatomal) (Left)   6. Allergy history, anesthetic (Unconfirmed allergy to Lidocaine)    Plan of Care  Pharmacotherapy (Medications Ordered): Meds ordered this encounter  Medications  . Oxycodone HCl 10 MG TABS    Sig: Take 1 tablet (10 mg total) by mouth every 6 (six) hours as needed.    Dispense:  120 tablet    Refill:  0    Do not place this medication, or any other prescription from our practice, on "Automatic Refill". Patient may have prescription filled one day early if pharmacy is closed on scheduled refill date. Do not fill until: 01/30/16 To last until: 02/29/16  . Oxycodone HCl 10 MG TABS    Sig: Take 1 tablet (10 mg total) by mouth every 6 (six) hours as needed.    Dispense:  120 tablet    Refill:  0    Do not place this medication, or any other prescription from our practice, on "Automatic Refill". Patient may have prescription filled one day early if pharmacy is closed on scheduled refill date. Do not fill until: 02/29/16 To last until: 03/30/16  . Oxycodone HCl 10 MG TABS    Sig: Take 1 tablet (10 mg total) by mouth every 6 (six) hours as needed.    Dispense:  120 tablet    Refill:  0    Do not place this medication, or any other prescription from our  practice, on "Automatic Refill". Patient may have prescription filled one day early  if pharmacy is closed on scheduled refill date. Do not fill until: 03/30/16 To last until: 04/29/16  . NONFORMULARY OR COMPOUNDED ITEM    Sig: 10% Ketamine/2% Cyclobenzaprine/6% Gabapentin Cream Sig: 1-2 ml to affected area 3-4 times/day. Amount: 240 GM    Dispense:  240 each    Refill:  2   New Prescriptions   No medications on file   Medications administered today: Mr. Turgeon had no medications administered during this visit. Lab-work, procedure(s), and/or referral(s): Orders Placed This Encounter  Procedures  . ToxASSURE Select 13 (MW), Urine  . Ambulatory referral to Allergy   Imaging and/or referral(s): AMB REFERRAL TO ALLERGY  Interventional therapies: Planned, scheduled, and/or pending:   None at this time. Claims to have anaphylactic allergy to Lidocaine.   Considering:   None at this time. Claims to have anaphylactic allergy to Lidocaine.   Palliative PRN treatment(s):   None at this time. Claims to have anaphylactic allergy to Lidocaine.   Provider-requested follow-up: Return in about 3 months (around 04/12/2016) for Med-Mgmt.  Future Appointments Date Time Provider [place]  01/22/2016 10:00 AM [name], DO [facility] None  04/16/2016 10:00 AM [name], MD [facility] None   Primary Care Physician: Freddy Finner, NP Location: [facility] Note by: Kathlen Brunswick. Dossie Arbour, M.D, DABA, DABAPM, DABPM, DABIPP, FIPP  Pain Score Disclaimer: We use the NRS-11 scale. This is a self-reported, subjective measurement of pain severity with only modest accuracy. It is used primarily to identify changes within a particular patient. It must be understood that outpatient pain scales are significantly less accurate that those used for research, where they can be applied under ideal controlled circumstances with minimal exposure to variables. In reality,  the score is likely to be a combination of pain intensity and pain affect, where pain affect describes the degree of emotional arousal or changes in action readiness caused by the sensory experience of pain. Factors such as social and work situation, setting, emotional state, anxiety levels, expectation, and prior pain experience may influence pain perception and show large inter-individual differences that may also be affected by time variables.  Patient instructions provided during this appointment: Patient Instructions  Pain Management Discharge Instructions  General Discharge Instructions :  If you need to reach your doctor call: Monday-Friday 8:00 am - 4:00 pm at [phone] or toll free [phone].  After clinic hours [phone] to have operator reach doctor.  Bring all of your medication bottles to all your appointments in the pain clinic.  To cancel or reschedule your appointment with Pain Management please remember to call 24 hours in advance to avoid a fee.  Refer to the educational materials which you have been given on: General Risks, I had my Procedure. Discharge Instructions, Post Sedation.  Post Procedure Instructions:  The drugs you were given will stay in your system until tomorrow, so for the next 24 hours you should not drive, make any legal decisions or drink any alcoholic beverages.  You may eat anything you prefer, but it is better to start with liquids then soups and crackers, and gradually work up to solid foods.  Please notify your doctor immediately if you have any unusual bleeding, trouble breathing or pain that is not related to your normal pain.  Depending on the type of procedure that was done, some parts of your body may feel week and/or numb.  This usually clears up by tonight or the next day.  Walk with the use of an  assistive device or accompanied by an adult for the 24 hours.  You may use ice on the affected area for the first 24 hours.  Put ice  in a Ziploc bag and cover with a towel and place against area 15 minutes on 15 minutes off.  You may switch to heat after 24 hours.

## 2016-01-11 NOTE — Patient Instructions (Signed)

## 2016-01-11 NOTE — Progress Notes (Signed)
Nursing Pain Medication Assessment:  Safety precautions to be maintained throughout the outpatient stay will include: orient to surroundings, keep bed in low position, maintain call bell within reach at all times, provide assistance with transfer out of bed and ambulation.  Medication Inspection Compliance: Pill count conducted under aseptic conditions, in front of the patient. Neither the pills nor the bottle was removed from the patient's sight at any time. Once count was completed pills were immediately returned to the patient in their original bottle.  Medication: See above Pill Count: 10 of 120 pills remain Bottle Appearance: Standard pharmacy container. Clearly labeled. Filled Date: 41 / 29 / 2017 Medication last intake:01/11/16 745am  Pt states " I was trying to open my pill bottle  On the deck ,and I fell,and I lost some of the pills in the yard ,and they were wet ,and I couldn't pick them up. Laast week.

## 2016-01-20 LAB — TOXASSURE SELECT 13 (MW), URINE

## 2016-01-22 ENCOUNTER — Encounter: Payer: Self-pay | Admitting: Family Medicine

## 2016-01-22 ENCOUNTER — Ambulatory Visit (INDEPENDENT_AMBULATORY_CARE_PROVIDER_SITE_OTHER): Payer: Medicare Other | Admitting: Family Medicine

## 2016-01-22 VITALS — BP 157/99 | HR 93 | Temp 97.6°F | Ht 68.0 in | Wt 222.0 lb

## 2016-01-22 DIAGNOSIS — G43809 Other migraine, not intractable, without status migrainosus: Secondary | ICD-10-CM

## 2016-01-22 DIAGNOSIS — Z1159 Encounter for screening for other viral diseases: Secondary | ICD-10-CM | POA: Diagnosis not present

## 2016-01-22 DIAGNOSIS — Z125 Encounter for screening for malignant neoplasm of prostate: Secondary | ICD-10-CM | POA: Diagnosis not present

## 2016-01-22 DIAGNOSIS — I129 Hypertensive chronic kidney disease with stage 1 through stage 4 chronic kidney disease, or unspecified chronic kidney disease: Secondary | ICD-10-CM | POA: Insufficient documentation

## 2016-01-22 DIAGNOSIS — Z13220 Encounter for screening for lipoid disorders: Secondary | ICD-10-CM

## 2016-01-22 DIAGNOSIS — G894 Chronic pain syndrome: Secondary | ICD-10-CM | POA: Diagnosis not present

## 2016-01-22 DIAGNOSIS — Z114 Encounter for screening for human immunodeficiency virus [HIV]: Secondary | ICD-10-CM | POA: Diagnosis not present

## 2016-01-22 DIAGNOSIS — Z1211 Encounter for screening for malignant neoplasm of colon: Secondary | ICD-10-CM

## 2016-01-22 DIAGNOSIS — G43909 Migraine, unspecified, not intractable, without status migrainosus: Secondary | ICD-10-CM | POA: Insufficient documentation

## 2016-01-22 DIAGNOSIS — I10 Essential (primary) hypertension: Secondary | ICD-10-CM | POA: Diagnosis not present

## 2016-01-22 LAB — UA/M W/RFLX CULTURE, ROUTINE
Bilirubin, UA: NEGATIVE
Glucose, UA: NEGATIVE
Ketones, UA: NEGATIVE
Leukocytes, UA: NEGATIVE
Nitrite, UA: NEGATIVE
Protein, UA: NEGATIVE
Specific Gravity, UA: 1.02 (ref 1.005–1.030)
Urobilinogen, Ur: 1 mg/dL (ref 0.2–1.0)
pH, UA: 7 (ref 5.0–7.5)

## 2016-01-22 LAB — MICROSCOPIC EXAMINATION: WBC, UA: NONE SEEN /hpf (ref 0–?)

## 2016-01-22 LAB — MICROALBUMIN, URINE WAIVED
Creatinine, Urine Waived: 200 mg/dL (ref 10–300)
Microalb, Ur Waived: 30 mg/L — ABNORMAL HIGH (ref 0–19)
Microalb/Creat Ratio: <30 mg/g (ref <30)

## 2016-01-22 MED ORDER — AMLODIPINE BESYLATE-VALSARTAN 10-320 MG PO TABS: 1.0000 | ORAL_TABLET | Freq: Every day | ORAL | 1 refills | Status: DC

## 2016-01-22 MED ORDER — METOPROLOL SUCCINATE ER 50 MG PO TB24: 50.0000 mg | ORAL_TABLET | Freq: Every day | ORAL | 2 refills | Status: DC

## 2016-01-22 MED ORDER — SUMATRIPTAN SUCCINATE 50 MG PO TABS: 50.0000 mg | ORAL_TABLET | ORAL | 3 refills | Status: DC | PRN

## 2016-01-22 NOTE — Assessment & Plan Note (Signed)
Continue to follow with Dr. Consuela Mimes. Call with any concerns.

## 2016-01-22 NOTE — Assessment & Plan Note (Signed)
Of unclear etiology. Possibly occipital neuralgia. Possibly migranous. Will start him on metoprolol for BP control as well as migraine prophylaxis. Recheck 1 month to see how he's doing. If no better, may need referral to neurology.

## 2016-01-22 NOTE — Progress Notes (Signed)
BP (!) 157/99 (BP Location: Left Arm, Patient Position: Sitting, Cuff Size: Large)   Pulse 93   Temp 97.6 F (36.4 C)   Ht '5\' 8"'$  (1.727 m)   Wt 222 lb (100.7 kg)   SpO2 97%   BMI 33.75 kg/m    Subjective:    Patient ID: Kristopher [name], male    DOB: 02/12/1961, 55 y.o.   MRN: [id number]  HPI: Kristopher [name] is a 55 y.o. male here today to establish care.   Chief Complaint  Patient presents with  . Establish Care  . Migraine    Patient needs a refil on his Imitrex   HYPERTENSION Hypertension status: uncontrolled  Satisfied with current treatment? no Duration of hypertension: chronic BP monitoring frequency:  a few times a week BP range: 150s BP medication side effects:  no Medication compliance: excellent compliance Previous BP meds: amlodipine and valsartan Aspirin: no Recurrent headaches: yes Visual changes: yes Palpitations: no Dyspnea: no Chest pain: no Lower extremity edema: no Dizzy/lightheaded: no  MIGRAINES- diagnosed with greater occipital neuralgia Duration: chronic- since accident in 2011 when he fell off a truck Onset: sudden Severity: severe Quality: sharp, smashing pain Frequency: daily Location: back of his head Headache duration: seconds to minutes Radiation: no Time of day headache occurs: any particular time Headache status at time of visit: current headache Treatments attempted: triptans, amitriptyline without benefit   Aura: yes Nausea:  no Vomiting: no Photophobia:  no Phonophobia:  no Effect on social functioning:  no Confusion:  yes Gait disturbance/ataxia:  no Behavioral changes:  no Fevers:  no  Active Ambulatory Problems    Diagnosis Date Noted  . Chronic low back pain (Location of Primary Source of Pain) (Bilateral) (L>R) 01/05/2015  . Lumbar spondylosis 01/05/2015  . Chronic lumbar radicular pain (Location of Secondary source of pain) (S1 dermatomal) (Left) 01/05/2015  . Failed back surgical syndrome (L5-S1 Laminectomy  and Discectomy) 01/05/2015  . Chronic neck pain (posterior midline) (Bilateral) (L>R) 01/05/2015  . Cervical spondylosis 01/05/2015  . Chronic cervical radicular pain (Location of Tertiary source of pain) (Bilateral) (C5/C6 dermatome) (L>R) 01/05/2015  . Long term current use of opiate analgesic 01/05/2015  . Long term prescription opiate use 01/05/2015  . Opiate use (60 MME/Day) 01/05/2015  . Opioid dependence ([place]) 01/05/2015  . Encounter for therapeutic drug level monitoring 01/05/2015  . Cervical spinal stenosis (C4-5) 01/06/2015  . Cervical foraminal stenosis (Bilateral C5-6) 01/06/2015  . Hypokalemia 02/01/2015  . Retrolisthesis of L5-S1 02/06/2015  . Cervical disc herniation (C4-5 and C5-6) 02/06/2015  . Lumbar disc herniation (L5-S1) 02/06/2015  . Chronic sacroiliac joint pain (Bilateral) (L>R) 05/31/2015  . Allergy history, anesthetic (Unconfirmed allergy to Lidocaine) 05/31/2015  . Chronic hip pain (Left) 05/31/2015  . Lumbar facet syndrome (Location of Primary Source of Pain) (Bilateral) (L>R) 05/31/2015  . Chronic lower extremity pain (Location of Secondary source of pain) (Left) 05/31/2015  . Chronic upper extremity pain (Location of Tertiary source of pain) (Bilateral) (L>R) 05/31/2015  . Greater occipital neuralgia (Right) 05/31/2015  . Chronic pain syndrome 01/11/2016  . Essential hypertension 01/22/2016  . Migraine 01/22/2016   Resolved Ambulatory Problems    Diagnosis Date Noted  . No Resolved Ambulatory Problems   Past Medical History:  Diagnosis Date  . Allergy   . Chronic back pain   . Hypertension   . Migraines    Past Surgical History:  Procedure Laterality Date  . APPENDECTOMY    . BACK SURGERY    .  FOOT SURGERY Left    Screws and plates  . KNEE SURGERY Left    X 2  . LEG SURGERY     Outpatient Encounter Prescriptions as of 01/22/2016  Medication Sig Note  . amLODipine-valsartan (EXFORGE) 10-320 MG tablet Take 1 tablet by mouth daily.   .  Naloxone HCl (NARCAN) 4 MG/0.1ML LIQD Place 1 Bottle into the nose once. , then call 911, repeat if needed in other nostril with new bottle.   Derrill Memo ON 01/30/2016] NONFORMULARY OR COMPOUNDED ITEM 10% Ketamine/2% Cyclobenzaprine/6% Gabapentin Cream Sig: 1-2 ml to affected area 3-4 times/day. Amount: 240 GM   . [START ON 01/30/2016] Oxycodone HCl 10 MG TABS Take 1 tablet (10 mg total) by mouth every 6 (six) hours as needed.   . SUMAtriptan (IMITREX) 50 MG tablet Take 1 tablet (50 mg total) by mouth every 2 (two) hours as needed for migraine. May repeat in 2 hours if headache persists or recurs.   . [DISCONTINUED] amLODipine-valsartan (EXFORGE) 10-320 MG tablet Take by mouth daily.  10/11/2015: Received from: External Pharmacy  . [DISCONTINUED] SUMAtriptan (IMITREX) 50 MG tablet Take 50 mg by mouth every 2 (two) hours as needed for migraine. May repeat in 2 hours if headache persists or recurs.   . metoprolol succinate (TOPROL-XL) 50 MG 24 hr tablet Take 1 tablet (50 mg total) by mouth daily. Take with or immediately following a meal.   . [START ON 02/29/2016] Oxycodone HCl 10 MG TABS Take 1 tablet (10 mg total) by mouth every 6 (six) hours as needed.   Derrill Memo ON 03/30/2016] Oxycodone HCl 10 MG TABS Take 1 tablet (10 mg total) by mouth every 6 (six) hours as needed.    No facility-administered encounter medications on file as of 01/22/2016.    Allergies  Allergen Reactions  . Lidocaine Anaphylaxis  . Penicillins Anaphylaxis  . Strawberry (Diagnostic) Anaphylaxis  . Aspirin     Feels like his throat is closing up   Social History   Social History  . Marital status: Significant Other    Spouse name: N/A  . Number of children: N/A  . Years of education: N/A   Occupational History  . Not on file.   Social History Main Topics  . Smoking status: Current Some Day Smoker    Packs/day: 0.50    Years: 35.00    Types: Cigarettes  . Smokeless tobacco: Never Used  . Alcohol use No  . Drug  use: No  . Sexual activity: Not on file   Other Topics Concern  . Not on file   Social History Narrative  . No narrative on file   Family History  Problem Relation Age of Onset  . Cancer Father   . Diabetes Sister   . Thrombosis Sister    Review of Systems  Constitutional: Negative.   Respiratory: Negative.   Cardiovascular: Negative.   Psychiatric/Behavioral: Negative.     Per HPI unless specifically indicated above     Objective:    BP (!) 157/99 (BP Location: Left Arm, Patient Position: Sitting, Cuff Size: Large)   Pulse 93   Temp 97.6 F (36.4 C)   Ht '5\' 8"'$  (1.727 m)   Wt 222 lb (100.7 kg)   SpO2 97%   BMI 33.75 kg/m   Wt Readings from Last 3 Encounters:  01/22/16 222 lb (100.7 kg)  01/11/16 225 lb (102.1 kg)  10/11/15 205 lb (93 kg)    Physical Exam  Constitutional: He is oriented to  person, place, and time. He appears well-developed and well-nourished. No distress.  HENT:  Head: Normocephalic and atraumatic.  Right Ear: Hearing normal.  Left Ear: Hearing normal.  Nose: Nose normal.  Eyes: Conjunctivae and lids are normal. Right eye exhibits no discharge. Left eye exhibits no discharge. No scleral icterus.  Cardiovascular: Normal rate, regular rhythm, normal heart sounds and intact distal pulses.  Exam reveals no gallop and no friction rub.   No murmur heard. Pulmonary/Chest: Effort normal and breath sounds normal. No respiratory distress. He has no wheezes. He has no rales. He exhibits no tenderness.  Musculoskeletal: Normal range of motion.  Neurological: He is alert and oriented to person, place, and time.  Skin: Skin is warm, dry and intact. No rash noted. No erythema. No pallor.  Psychiatric: He has a normal mood and affect. His speech is normal and behavior is normal. Judgment and thought content normal. Cognition and memory are normal.  Nursing note reviewed.   Results for orders placed or performed in visit on 01/11/16  ToxASSURE Select 68  (MW), Urine  Result Value Ref Range   ToxAssure Select 13 FINAL       Assessment & Plan:   Problem List Items Addressed This Visit      Cardiovascular and Mediastinum   Essential hypertension - Primary    Not under good control. Will start metoprolol. Recheck 1 month. Call with any concerns.       Relevant Medications   amLODipine-valsartan (EXFORGE) 10-320 MG tablet   metoprolol succinate (TOPROL-XL) 50 MG 24 hr tablet   Other Relevant Orders   CBC with Differential/Platelet   Comprehensive metabolic panel   Microalbumin, Urine Waived   TSH   UA/M w/rflx Culture, Routine   Migraine    Of unclear etiology. Possibly occipital neuralgia. Possibly migranous. Will start him on metoprolol for BP control as well as migraine prophylaxis. Recheck 1 month to see how he's doing. If no better, may need referral to neurology.      Relevant Medications   amLODipine-valsartan (EXFORGE) 10-320 MG tablet   SUMAtriptan (IMITREX) 50 MG tablet   metoprolol succinate (TOPROL-XL) 50 MG 24 hr tablet   Other Relevant Orders   Comprehensive metabolic panel     Other   Chronic pain syndrome (Chronic)    Continue to follow with Dr. Consuela Mimes. Call with any concerns.        Other Visit Diagnoses    Screening for colon cancer       Referral for colonoscopy put in today.   Relevant Orders   Ambulatory referral to General Surgery   Need for hepatitis C screening test       Labs drawn today.   Screening for cholesterol level       Labs drawn today   Relevant Orders   Lipid Panel w/o Chol/HDL Ratio   Screening for HIV (human immunodeficiency virus)       Labs drawn today.   Screening for prostate cancer       Labs drawn today   Relevant Orders   PSA       Follow up plan: Return in about 4 weeks (around 02/19/2016) for Follow up BP/Migraine.

## 2016-01-22 NOTE — Assessment & Plan Note (Signed)
Not under good control. Will start metoprolol. Recheck 1 month. Call with any concerns.

## 2016-01-23 ENCOUNTER — Encounter: Payer: Self-pay | Admitting: Family Medicine

## 2016-01-23 LAB — COMPREHENSIVE METABOLIC PANEL
ALT: 52 IU/L — ABNORMAL HIGH (ref 0–44)
AST: 38 IU/L (ref 0–40)
Albumin/Globulin Ratio: 2 (ref 1.2–2.2)
Albumin: 4.7 g/dL (ref 3.5–5.5)
Alkaline Phosphatase: 104 IU/L (ref 39–117)
BUN/Creatinine Ratio: 11 (ref 9–20)
BUN: 10 mg/dL (ref 6–24)
Bilirubin Total: 0.4 mg/dL (ref 0.0–1.2)
CO2: 23 mmol/L (ref 18–29)
Calcium: 9.3 mg/dL (ref 8.7–10.2)
Chloride: 101 mmol/L (ref 96–106)
Creatinine, Ser: 0.88 mg/dL (ref 0.76–1.27)
GFR calc Af Amer: 113 mL/min/{1.73_m2} (ref >59)
GFR calc non Af Amer: 98 mL/min/{1.73_m2} (ref >59)
Globulin, Total: 2.3 g/dL (ref 1.5–4.5)
Glucose: 133 mg/dL — ABNORMAL HIGH (ref 65–99)
Potassium: 3.8 mmol/L (ref 3.5–5.2)
Sodium: 140 mmol/L (ref 134–144)
Total Protein: 7 g/dL (ref 6.0–8.5)

## 2016-01-23 LAB — CBC WITH DIFFERENTIAL/PLATELET
Basophils Absolute: 0 10*3/uL (ref 0.0–0.2)
Basos: 1 %
EOS (ABSOLUTE): 0.1 10*3/uL (ref 0.0–0.4)
Eos: 2 %
Hematocrit: 42.2 % (ref 37.5–51.0)
Hemoglobin: 14.7 g/dL (ref 12.6–17.7)
Immature Grans (Abs): 0 10*3/uL (ref 0.0–0.1)
Immature Granulocytes: 0 %
Lymphocytes Absolute: 2.2 10*3/uL (ref 0.7–3.1)
Lymphs: 40 %
MCH: 29.9 pg (ref 26.6–33.0)
MCHC: 34.8 g/dL (ref 31.5–35.7)
MCV: 86 fL (ref 79–97)
Monocytes Absolute: 0.4 10*3/uL (ref 0.1–0.9)
Monocytes: 7 %
Neutrophils Absolute: 2.8 10*3/uL (ref 1.4–7.0)
Neutrophils: 50 %
Platelets: 212 10*3/uL (ref 150–379)
RBC: 4.92 x10E6/uL (ref 4.14–5.80)
RDW: 14.2 % (ref 12.3–15.4)
WBC: 5.5 10*3/uL (ref 3.4–10.8)

## 2016-01-23 LAB — LIPID PANEL W/O CHOL/HDL RATIO
Cholesterol, Total: 184 mg/dL (ref 100–199)
HDL: 32 mg/dL — ABNORMAL LOW (ref >39)
LDL Calculated: 109 mg/dL — ABNORMAL HIGH (ref 0–99)
Triglycerides: 217 mg/dL — ABNORMAL HIGH (ref 0–149)
VLDL Cholesterol Cal: 43 mg/dL — ABNORMAL HIGH (ref 5–40)

## 2016-01-23 LAB — PSA: Prostate Specific Ag, Serum: 0.7 ng/mL (ref 0.0–4.0)

## 2016-01-23 LAB — TSH: TSH: 0.617 u[IU]/mL (ref 0.450–4.500)

## 2016-02-02 ENCOUNTER — Telehealth: Payer: Self-pay

## 2016-02-02 ENCOUNTER — Other Ambulatory Visit: Payer: Self-pay

## 2016-02-02 NOTE — Telephone Encounter (Signed)
Gastroenterology Pre-Procedure Review  Request Date: 02/19/16 Requesting Physician: Dr. Jeananne Rama  PATIENT REVIEW QUESTIONS: The patient responded to the following health history questions as indicated:    1. Are you having any GI issues? no 2. Do you have a personal history of Polyps? no 3. Do you have a family history of Colon Cancer or Polyps? no 4. Diabetes Mellitus? no 5. Joint replacements in the past 12 months?no 6. Major health problems in the past 3 months?no 7. Any artificial heart valves, MVP, or defibrillator?no    MEDICATIONS & ALLERGIES:    Patient reports the following regarding taking any anticoagulation/antiplatelet therapy:   Plavix, Coumadin, Eliquis, Xarelto, Lovenox, Pradaxa, Brilinta, or Effient? no Aspirin? no  Patient confirms/reports the following medications:  Current Outpatient Prescriptions  Medication Sig Dispense Refill  . amLODipine-valsartan (EXFORGE) 10-320 MG tablet Take 1 tablet by mouth daily. 90 tablet 1  . metoprolol succinate (TOPROL-XL) 50 MG 24 hr tablet Take 1 tablet (50 mg total) by mouth daily. Take with or immediately following a meal. 30 tablet 2  . Naloxone HCl (NARCAN) 4 MG/0.1ML LIQD Place 1 Bottle into the nose once. , then call 911, repeat if needed in other nostril with new bottle. 2 each 0  . NONFORMULARY OR COMPOUNDED ITEM 10% Ketamine/2% Cyclobenzaprine/6% Gabapentin Cream Sig: 1-2 ml to affected area 3-4 times/day. Amount: 240 GM 240 each 2  . Oxycodone HCl 10 MG TABS Take 1 tablet (10 mg total) by mouth every 6 (six) hours as needed. 120 tablet 0  . SUMAtriptan (IMITREX) 50 MG tablet Take 1 tablet (50 mg total) by mouth every 2 (two) hours as needed for migraine. May repeat in 2 hours if headache persists or recurs. 10 tablet 3   No current facility-administered medications for this visit.     Patient confirms/reports the following allergies:  Allergies  Allergen Reactions  . Lidocaine Anaphylaxis  . Penicillins  Anaphylaxis  . Strawberry (Diagnostic) Anaphylaxis  . Aspirin     Feels like his throat is closing up    No orders of the defined types were placed in this encounter.   AUTHORIZATION INFORMATION Primary Insurance: 1D#: Group #:  Secondary Insurance: 1D#: Group #:  SCHEDULE INFORMATION: Date: 02/19/2016 Time: Location: [facility]

## 2016-02-02 NOTE — Telephone Encounter (Signed)
Create orders and send packet.

## 2016-02-02 NOTE — Telephone Encounter (Signed)
Screening Colonoscopy Z12.11 [facility] 02/19/2016 Dr. Allen Norris  [employer]/Medicare  Pre cert is required

## 2016-02-02 NOTE — Telephone Encounter (Signed)
Completed.

## 2016-02-05 ENCOUNTER — Telehealth: Payer: Self-pay | Admitting: Pain Medicine

## 2016-02-05 NOTE — Telephone Encounter (Signed)
Patient has original scripts and states his GF will fax.

## 2016-02-05 NOTE — Telephone Encounter (Signed)
[employer] needs copy of script for meds to be sent to them before they will cover he meds Please call patient to discuss  Fax #  [phone]  Attn Mady Haagensen

## 2016-02-13 ENCOUNTER — Ambulatory Visit: Payer: Medicare Other | Admitting: Family Medicine

## 2016-02-15 ENCOUNTER — Encounter: Payer: Self-pay | Admitting: *Deleted

## 2016-02-16 NOTE — Discharge Instructions (Signed)

## 2016-02-19 ENCOUNTER — Encounter
Admission: RE | Disposition: A | Discharge status: Home / Self Care | Payer: Self-pay | Source: Ambulatory Visit | Attending: Gastroenterology

## 2016-02-19 ENCOUNTER — Ambulatory Visit: Payer: Medicare Other | Admitting: Anesthesiology

## 2016-02-19 ENCOUNTER — Ambulatory Visit
Admission: RE | Admit: 2016-02-19 | Discharge: 2016-02-19 | Disposition: A | Discharge status: Home / Self Care | Payer: Medicare Other | Source: Ambulatory Visit | Attending: Gastroenterology | Admitting: Gastroenterology

## 2016-02-19 DIAGNOSIS — K635 Polyp of colon: Secondary | ICD-10-CM | POA: Diagnosis not present

## 2016-02-19 DIAGNOSIS — M1990 Unspecified osteoarthritis, unspecified site: Secondary | ICD-10-CM | POA: Diagnosis not present

## 2016-02-19 DIAGNOSIS — Z79899 Other long term (current) drug therapy: Secondary | ICD-10-CM | POA: Diagnosis not present

## 2016-02-19 DIAGNOSIS — G8929 Other chronic pain: Secondary | ICD-10-CM | POA: Diagnosis not present

## 2016-02-19 DIAGNOSIS — G709 Myoneural disorder, unspecified: Secondary | ICD-10-CM | POA: Diagnosis not present

## 2016-02-19 DIAGNOSIS — K621 Rectal polyp: Secondary | ICD-10-CM

## 2016-02-19 DIAGNOSIS — D125 Benign neoplasm of sigmoid colon: Secondary | ICD-10-CM | POA: Diagnosis not present

## 2016-02-19 DIAGNOSIS — I10 Essential (primary) hypertension: Secondary | ICD-10-CM | POA: Insufficient documentation

## 2016-02-19 DIAGNOSIS — Z1211 Encounter for screening for malignant neoplasm of colon: Secondary | ICD-10-CM | POA: Diagnosis not present

## 2016-02-19 DIAGNOSIS — F17210 Nicotine dependence, cigarettes, uncomplicated: Secondary | ICD-10-CM | POA: Insufficient documentation

## 2016-02-19 DIAGNOSIS — D122 Benign neoplasm of ascending colon: Secondary | ICD-10-CM | POA: Insufficient documentation

## 2016-02-19 DIAGNOSIS — K641 Second degree hemorrhoids: Secondary | ICD-10-CM | POA: Insufficient documentation

## 2016-02-19 HISTORY — DX: Unspecified osteoarthritis, unspecified site: M19.90

## 2016-02-19 HISTORY — PX: COLONOSCOPY WITH PROPOFOL: SHX5780

## 2016-02-19 HISTORY — PX: POLYPECTOMY: SHX5525

## 2016-02-19 LAB — HM COLONOSCOPY

## 2016-02-19 SURGERY — COLONOSCOPY WITH PROPOFOL
Anesthesia: Monitor Anesthesia Care

## 2016-02-19 MED ORDER — ACETAMINOPHEN 160 MG/5ML PO SOLN: 325.0000 mg | ORAL | Status: DC | PRN

## 2016-02-19 MED ORDER — ACETAMINOPHEN 325 MG PO TABS: 325.0000 mg | ORAL_TABLET | ORAL | Status: DC | PRN

## 2016-02-19 MED ORDER — PROPOFOL 10 MG/ML IV BOLUS
INTRAVENOUS | Status: DC | PRN
  Administered 2016-02-19 (×4): 50 mg via INTRAVENOUS
  Administered 2016-02-19: 100 mg via INTRAVENOUS

## 2016-02-19 MED ORDER — STERILE WATER FOR IRRIGATION IR SOLN
Status: DC | PRN
  Administered 2016-02-19: 08:00:00

## 2016-02-19 MED ORDER — LACTATED RINGERS IV SOLN
INTRAVENOUS | Status: DC
  Administered 2016-02-19: 08:00:00 via INTRAVENOUS

## 2016-02-19 SURGICAL SUPPLY — 23 items
CANISTER SUCT 1200ML W/VALVE (MISCELLANEOUS) ×2 IMPLANT
CLIP HMST 235XBRD CATH ROT (MISCELLANEOUS) IMPLANT
CLIP RESOLUTION 360 11X235 (MISCELLANEOUS)
FCP ESCP3.2XJMB 240X2.8X (MISCELLANEOUS) ×1
FORCEPS BIOP RAD 4 LRG CAP 4 (CUTTING FORCEPS) IMPLANT
FORCEPS BIOP RJ4 240 W/NDL (MISCELLANEOUS) ×1
FORCEPS ESCP3.2XJMB 240X2.8X (MISCELLANEOUS) ×1 IMPLANT
GOWN CVR UNV OPN BCK APRN NK (MISCELLANEOUS) ×2 IMPLANT
GOWN ISOL THUMB LOOP REG UNIV (MISCELLANEOUS) ×2
INJECTOR VARIJECT VIN23 (MISCELLANEOUS) IMPLANT
KIT DEFENDO VALVE AND CONN (KITS) IMPLANT
KIT ENDO PROCEDURE OLY (KITS) ×2 IMPLANT
MARKER SPOT ENDO TATTOO 5ML (MISCELLANEOUS) IMPLANT
PAD GROUND ADULT SPLIT (MISCELLANEOUS) IMPLANT
PROBE APC STR FIRE (PROBE) IMPLANT
RETRIEVER NET ROTH 2.5X230 LF (MISCELLANEOUS) IMPLANT
SNARE SHORT THROW 13M SML OVAL (MISCELLANEOUS) IMPLANT
SNARE SHORT THROW 30M LRG OVAL (MISCELLANEOUS) ×2 IMPLANT
SNARE SNG USE RND 15MM (INSTRUMENTS) IMPLANT
SPOT EX ENDOSCOPIC TATTOO (MISCELLANEOUS)
TRAP ETRAP POLY (MISCELLANEOUS) ×2 IMPLANT
VARIJECT INJECTOR VIN23 (MISCELLANEOUS)
WATER STERILE IRR 250ML POUR (IV SOLUTION) ×2 IMPLANT

## 2016-02-19 NOTE — Anesthesia Postprocedure Evaluation (Signed)
Anesthesia Post Note  Patient: Kristopher [name]  Procedure(s) Performed: Procedure(s) (LRB): COLONOSCOPY WITH PROPOFOL (N/A) POLYPECTOMY (N/A)  Patient location during evaluation: PACU Anesthesia Type: MAC Level of consciousness: awake and alert Pain management: pain level controlled Vital Signs Assessment: post-procedure vital signs reviewed and stable Respiratory status: spontaneous breathing, nonlabored ventilation, respiratory function stable and patient connected to nasal cannula oxygen Cardiovascular status: stable and blood pressure returned to baseline Anesthetic complications: no    Trecia Rogers

## 2016-02-19 NOTE — Anesthesia Preprocedure Evaluation (Signed)
Anesthesia Evaluation  Patient identified by MRN, date of birth, ID band Patient awake    Reviewed: Allergy & Precautions, H&P , NPO status , Patient's Chart, lab work & pertinent test results, reviewed documented beta blocker date and time   Airway Mallampati: I  TM Distance: >3 FB Neck ROM: full    Dental no notable dental hx.    Pulmonary Current Smoker,    Pulmonary exam normal breath sounds clear to auscultation       Cardiovascular Exercise Tolerance: Good hypertension, Normal cardiovascular exam Rhythm:regular Rate:Normal     Neuro/Psych  Headaches,  Neuromuscular disease negative neurological ROS  negative psych ROS   GI/Hepatic negative GI ROS, Neg liver ROS,   Endo/Other  negative endocrine ROS  Renal/GU negative Renal ROS  negative genitourinary   Musculoskeletal   Abdominal   Peds  Hematology negative hematology ROS (+)   Anesthesia Other Findings Chronic narcotic use  Reproductive/Obstetrics negative OB ROS                             Anesthesia Physical Anesthesia Plan  ASA: II  Anesthesia Plan: MAC   Post-op Pain Management:    Induction:   Airway Management Planned:   Additional Equipment:   Intra-op Plan:   Post-operative Plan:   Informed Consent: I have reviewed the patients History and Physical, chart, labs and discussed the procedure including the risks, benefits and alternatives for the proposed anesthesia with the patient or authorized representative who has indicated his/her understanding and acceptance.   Dental Advisory Given  Plan Discussed with: CRNA and Anesthesiologist  Anesthesia Plan Comments:         Anesthesia Quick Evaluation

## 2016-02-19 NOTE — H&P (Signed)
Lucilla Lame, MD [facility] [address] [place], [place] [address] Phone: [phone] Fax : [phone]  Primary Care Physician:  Park Liter, DO Primary Gastroenterologist:  Dr. Allen Norris  Pre-Procedure History & Physical: HPI:  Kristopher [name] is a 55 y.o. male is here for a screening colonoscopy.   Past Medical History:  Diagnosis Date  . Allergy   . Arthritis    left foot  . Chronic back pain    Four rods in back  . Hypertension   . Migraines    daily    Past Surgical History:  Procedure Laterality Date  . APPENDECTOMY    . BACK SURGERY    . FOOT SURGERY Left    Screws and plates  . KNEE SURGERY Left    X 2  . LEG SURGERY      Prior to Admission medications   Medication Sig Start Date End Date Taking? Authorizing Provider  amLODipine-valsartan (EXFORGE) 10-320 MG tablet Take 1 tablet by mouth daily. 01/22/16  Yes [name] Johnson, DO  metoprolol succinate (TOPROL-XL) 50 MG 24 hr tablet Take 1 tablet (50 mg total) by mouth daily. Take with or immediately following a meal. 01/22/16  Yes [name] Johnson, DO  Naloxone HCl (NARCAN) 4 MG/0.1ML LIQD Place 1 Bottle into the nose once. , then call 911, repeat if needed in other nostril with new bottle. 05/31/15  Yes [name], MD  NONFORMULARY OR COMPOUNDED ITEM 10% Ketamine/2% Cyclobenzaprine/6% Gabapentin Cream Sig: 1-2 ml to affected area 3-4 times/day. Amount: 240 GM 01/30/16 04/29/16 Yes [name], MD  Oxycodone HCl 10 MG TABS Take 1 tablet (10 mg total) by mouth every 6 (six) hours as needed. 01/30/16 02/29/16 Yes [name], MD  SUMAtriptan (IMITREX) 50 MG tablet Take 1 tablet (50 mg total) by mouth every 2 (two) hours as needed for migraine. May repeat in 2 hours if headache persists or recurs. 01/22/16  Yes [name] Johnson, DO    Allergies as of 02/02/2016 - Review Complete 02/02/2016  Allergen Reaction Noted  . Lidocaine Anaphylaxis 07/07/2014  . Penicillins Anaphylaxis 11/11/2014  .  Strawberry (diagnostic) Anaphylaxis 04/03/2015  . Aspirin  01/22/2016    Family History  Problem Relation Age of Onset  . Cancer Father   . Diabetes Sister   . Thrombosis Sister     Social History   Social History  . Marital status: Significant Other    Spouse name: N/A  . Number of children: N/A  . Years of education: N/A   Occupational History  . Not on file.   Social History Main Topics  . Smoking status: Current Some Day Smoker    Packs/day: 0.75    Years: 35.00    Types: Cigarettes  . Smokeless tobacco: Never Used  . Alcohol use No  . Drug use: No  . Sexual activity: Not on file   Other Topics Concern  . Not on file   Social History Narrative  . No narrative on file    Review of Systems: See HPI, otherwise negative ROS  Physical Exam: BP (!) 144/104   Pulse 87   Temp 97.1 F (36.2 C) (Temporal)   Ht '5\' 8"'$  (1.727 m)   Wt 221 lb (100.2 kg)   SpO2 98%   BMI 33.60 kg/m  General:   Alert,  pleasant and cooperative in NAD Head:  Normocephalic and atraumatic. Neck:  Supple; no masses or thyromegaly. Lungs:  Clear throughout to auscultation.    Heart:  Regular  rate and rhythm. Abdomen:  Soft, nontender and nondistended. Normal bowel sounds, without guarding, and without rebound.   Neurologic:  Alert and  oriented x4;  grossly normal neurologically.  Impression/Plan: Kristopher [name] is now here to undergo a screening colonoscopy.  Risks, benefits, and alternatives regarding colonoscopy have been reviewed with the patient.  Questions have been answered.  All parties agreeable.

## 2016-02-19 NOTE — Transfer of Care (Signed)
Immediate Anesthesia Transfer of Care Note  Patient: Kristopher [name]  Procedure(s) Performed: Procedure(s): COLONOSCOPY WITH PROPOFOL (N/A) POLYPECTOMY (N/A)  Patient Location: PACU  Anesthesia Type: MAC  Level of Consciousness: awake, alert  and patient cooperative  Airway and Oxygen Therapy: Patient Spontanous Breathing and Patient connected to supplemental oxygen  Post-op Assessment: Post-op Vital signs reviewed, Patient's Cardiovascular Status Stable, Respiratory Function Stable, Patent Airway and No signs of Nausea or vomiting  Post-op Vital Signs: Reviewed and stable  Complications: No apparent anesthesia complications

## 2016-02-19 NOTE — Anesthesia Procedure Notes (Signed)
Procedure Name: MAC Date/Time: 02/19/2016 8:25 AM Performed by: Janna Arch Pre-anesthesia Checklist: Patient identified, Emergency Drugs available, Suction available and Patient being monitored Patient Re-evaluated:Patient Re-evaluated prior to inductionOxygen Delivery Method: Nasal cannula

## 2016-02-19 NOTE — Op Note (Signed)
[facility] Gastroenterology Patient Name: Kristopher [name] Procedure Date: 02/19/2016 8:20 AM MRN: [id number] Account #: [id number] Date of Birth: 1960/12/27 Admit Type: Outpatient Age: 55 Room: [facility] OR ROOM 01 Gender: Male Note Status: Finalized Procedure:            Colonoscopy Indications:          Screening for colorectal malignant neoplasm Providers:            Lucilla Lame MD, MD Referring MD:         [name] (Referring MD) Medicines:            Propofol per Anesthesia Complications:        No immediate complications. Procedure:            Pre-Anesthesia Assessment:                       - Prior to the procedure, a History and Physical was                        performed, and patient medications and allergies were                        reviewed. The patient's tolerance of previous                        anesthesia was also reviewed. The risks and benefits of                        the procedure and the sedation options and risks were                        discussed with the patient. All questions were                        answered, and informed consent was obtained. Prior                        Anticoagulants: The patient has taken no previous                        anticoagulant or antiplatelet agents. ASA Grade                        Assessment: II - A patient with mild systemic disease.                        After reviewing the risks and benefits, the patient was                        deemed in satisfactory condition to undergo the                        procedure.                       After obtaining informed consent, the colonoscope was                        passed under direct vision. Throughout the procedure,  the patient's blood pressure, pulse, and oxygen                        saturations were monitored continuously. The Olympus CF                        H180AL colonoscope (S#: [id number]) was introduced through                         the anus and advanced to the the cecum, identified by                        appendiceal orifice and ileocecal valve. The                        colonoscopy was performed without difficulty. The                        patient tolerated the procedure well. The quality of                        the bowel preparation was poor. Findings:      The perianal and digital rectal examinations were normal.      A 4 mm polyp was found in the ascending colon. The polyp was sessile.       The polyp was removed with a cold snare. Resection and retrieval were       complete.      Three sessile polyps were found in the sigmoid colon. The polyps were 4       to 6 mm in size. These polyps were removed with a hot snare. Resection       and retrieval were complete.      Two pedunculated polyps were found in the rectum. The polyps were 4 to 7       mm in size. These polyps were removed with a hot snare. Resection and       retrieval were complete.      Non-bleeding internal hemorrhoids were found during retroflexion. The       hemorrhoids were Grade II (internal hemorrhoids that prolapse but reduce       spontaneously). Impression:           - Preparation of the colon was poor.                       - One 4 mm polyp in the ascending colon, removed with a                        cold snare. Resected and retrieved.                       - Three 4 to 6 mm polyps in the sigmoid colon, removed                        with a hot snare. Resected and retrieved.                       - Two 4 to 7 mm polyps in the rectum, removed with a  hot snare. Resected and retrieved.                       - Non-bleeding internal hemorrhoids. Recommendation:       - Discharge patient to home.                       - Resume previous diet.                       - Continue present medications.                       - Await pathology results.                       - Repeat colonoscopy in 5  years if polyp adenoma and 10                        years if hyperplastic Procedure Code(s):    --- Professional ---                       [phone], Colonoscopy, flexible; with removal of tumor(s),                        polyp(s), or other lesion(s) by snare technique Diagnosis Code(s):    --- Professional ---                       Z12.11, Encounter for screening for malignant neoplasm                        of colon                       D12.2, Benign neoplasm of ascending colon                       D12.5, Benign neoplasm of sigmoid colon                       K62.1, Rectal polyp CPT copyright 2016 American Medical Association. All rights reserved. The codes documented in this report are preliminary and upon coder review may  be revised to meet current compliance requirements. Lucilla Lame MD, MD 02/19/2016 8:43:50 AM This report has been signed electronically. Number of Addenda: 0 Note Initiated On: 02/19/2016 8:20 AM Scope Withdrawal Time: 0 hours 11 minutes 7 seconds  Total Procedure Duration: 0 hours 13 minutes 35 seconds       [facility]

## 2016-02-20 ENCOUNTER — Encounter: Payer: Self-pay | Admitting: Gastroenterology

## 2016-02-21 ENCOUNTER — Telehealth: Payer: Self-pay | Admitting: Family Medicine

## 2016-02-21 MED ORDER — ERYTHROMYCIN BASE 333 MG PO TBEC
333.0000 mg | DELAYED_RELEASE_TABLET | Freq: Three times a day (TID) | ORAL | 0 refills | Status: DC
Start: 2016-02-21 — End: 2016-04-15

## 2016-02-21 NOTE — Telephone Encounter (Signed)
Routing to provider  

## 2016-02-21 NOTE — Telephone Encounter (Signed)
Patient has broken a tooth off and the dentist cannot see him until after the new year so they are wanting an antibiotic called in for him, Per Vaughan Basta the dentist will not call this medication in for the patient.   Thank You  Kristopher [name]

## 2016-02-22 ENCOUNTER — Encounter: Payer: Self-pay | Admitting: Gastroenterology

## 2016-02-23 ENCOUNTER — Encounter: Payer: Self-pay | Admitting: Gastroenterology

## 2016-03-01 ENCOUNTER — Encounter: Payer: Self-pay | Admitting: Family Medicine

## 2016-03-01 ENCOUNTER — Ambulatory Visit (INDEPENDENT_AMBULATORY_CARE_PROVIDER_SITE_OTHER): Payer: Medicare Other | Admitting: Family Medicine

## 2016-03-01 VITALS — BP 152/96 | HR 80 | Temp 97.8°F | Ht 70.0 in | Wt 223.0 lb

## 2016-03-01 DIAGNOSIS — G43809 Other migraine, not intractable, without status migrainosus: Secondary | ICD-10-CM | POA: Diagnosis not present

## 2016-03-01 DIAGNOSIS — I129 Hypertensive chronic kidney disease with stage 1 through stage 4 chronic kidney disease, or unspecified chronic kidney disease: Secondary | ICD-10-CM | POA: Diagnosis not present

## 2016-03-01 MED ORDER — METOPROLOL SUCCINATE ER 100 MG PO TB24: 100.0000 mg | ORAL_TABLET | Freq: Every day | ORAL | 1 refills | Status: DC

## 2016-03-01 NOTE — Assessment & Plan Note (Signed)
Better on recheck, but not under control. Will increase his metoprolol to '100mg'$  daily, HR 80 should tolerate it. Recheck 1 month. Call with any concerns.

## 2016-03-01 NOTE — Progress Notes (Signed)
BP (!) 152/96   Pulse 80   Temp 97.8 F (36.6 C) (Oral)   Ht '5\' 10"'$  (1.778 m)   Wt 223 lb (101.2 kg)   SpO2 99%   BMI 32.00 kg/m    Subjective:    Patient ID: Kristopher [name], male    DOB: 1960-11-14, 55 y.o.   MRN: [id number]  HPI: Kristopher [name] is a 55 y.o. male  Chief Complaint  Patient presents with  . Follow-up  . Hypertension  . Migraine    4 days in two weeks   HYPERTENSION Hypertension status: worse  Satisfied with current treatment? no Duration of hypertension: chronic BP monitoring frequency:  a few times a week BP range: 150s BP medication side effects:  no Medication compliance: excellent compliance Previous BP meds: amlodipine-valsartan, metoprolol Aspirin: no Recurrent headaches: yes Visual changes: yes- blurred Palpitations: no Dyspnea: no Chest pain: no Lower extremity edema: no Dizzy/lightheaded: yes- with the blurred vision  MIGRAINES- no better, seem to be worse. Has had good migraines 4x in the past couple of weeks.   Relevant past medical, surgical, family and social history reviewed and updated as indicated. Interim medical history since our last visit reviewed. Allergies and medications reviewed and updated.  Review of Systems  Constitutional: Negative.   Respiratory: Negative.   Cardiovascular: Negative.   Neurological: Positive for dizziness and headaches. Negative for tremors, seizures, syncope, facial asymmetry, speech difficulty, weakness, light-headedness and numbness.  Psychiatric/Behavioral: Negative.     Per HPI unless specifically indicated above     Objective:    BP (!) 152/96   Pulse 80   Temp 97.8 F (36.6 C) (Oral)   Ht '5\' 10"'$  (1.778 m)   Wt 223 lb (101.2 kg)   SpO2 99%   BMI 32.00 kg/m   Wt Readings from Last 3 Encounters:  03/01/16 223 lb (101.2 kg)  02/19/16 221 lb (100.2 kg)  01/22/16 222 lb (100.7 kg)    Physical Exam  Constitutional: He is oriented to person, place, and time. He appears  well-developed and well-nourished. No distress.  HENT:  Head: Normocephalic and atraumatic.  Right Ear: Hearing normal.  Left Ear: Hearing normal.  Nose: Nose normal.  Eyes: Conjunctivae, EOM and lids are normal. Pupils are equal, round, and reactive to light. Right eye exhibits no discharge. Left eye exhibits no discharge. No scleral icterus.  Cardiovascular: Normal rate, regular rhythm, normal heart sounds and intact distal pulses.  Exam reveals no gallop and no friction rub.   No murmur heard. Pulmonary/Chest: Effort normal and breath sounds normal. No respiratory distress. He has no wheezes. He has no rales. He exhibits no tenderness.  Musculoskeletal: Normal range of motion.  Neurological: He is alert and oriented to person, place, and time.  Skin: Skin is warm, dry and intact. No rash noted. He is not diaphoretic. No erythema. No pallor.  Psychiatric: He has a normal mood and affect. His speech is normal and behavior is normal. Judgment and thought content normal. Cognition and memory are normal.  Nursing note and vitals reviewed.   Results for orders placed or performed in visit on 01/22/16  Microscopic Examination  Result Value Ref Range   WBC, UA None seen 0 - 5 /hpf   RBC, UA 3-10 (A) 0 - 2 /hpf   Epithelial Cells (non renal) 0-10 0 - 10 /hpf  CBC with Differential/Platelet  Result Value Ref Range   WBC 5.5 3.4 - 10.8 x10E3/uL   RBC 4.92 4.14 - 5.80  x10E6/uL   Hemoglobin 14.7 12.6 - 17.7 g/dL   Hematocrit 42.2 37.5 - 51.0 %   MCV 86 79 - 97 fL   MCH 29.9 26.6 - 33.0 pg   MCHC 34.8 31.5 - 35.7 g/dL   RDW 14.2 12.3 - 15.4 %   Platelets 212 150 - 379 x10E3/uL   Neutrophils 50 Not Estab. %   Lymphs 40 Not Estab. %   Monocytes 7 Not Estab. %   Eos 2 Not Estab. %   Basos 1 Not Estab. %   Neutrophils Absolute 2.8 1.4 - 7.0 x10E3/uL   Lymphocytes Absolute 2.2 0.7 - 3.1 x10E3/uL   Monocytes Absolute 0.4 0.1 - 0.9 x10E3/uL   EOS (ABSOLUTE) 0.1 0.0 - 0.4 x10E3/uL   Basophils  Absolute 0.0 0.0 - 0.2 x10E3/uL   Immature Granulocytes 0 Not Estab. %   Immature Grans (Abs) 0.0 0.0 - 0.1 x10E3/uL  Comprehensive metabolic panel  Result Value Ref Range   Glucose 133 (H) 65 - 99 mg/dL   BUN 10 6 - 24 mg/dL   Creatinine, Ser 0.88 0.76 - 1.27 mg/dL   GFR calc non Af Amer 98 >59 mL/min/1.73   GFR calc Af Amer 113 >59 mL/min/1.73   BUN/Creatinine Ratio 11 9 - 20   Sodium 140 134 - 144 mmol/L   Potassium 3.8 3.5 - 5.2 mmol/L   Chloride 101 96 - 106 mmol/L   CO2 23 18 - 29 mmol/L   Calcium 9.3 8.7 - 10.2 mg/dL   Total Protein 7.0 6.0 - 8.5 g/dL   Albumin 4.7 3.5 - 5.5 g/dL   Globulin, Total 2.3 1.5 - 4.5 g/dL   Albumin/Globulin Ratio 2.0 1.2 - 2.2   Bilirubin Total 0.4 0.0 - 1.2 mg/dL   Alkaline Phosphatase 104 39 - 117 IU/L   AST 38 0 - 40 IU/L   ALT 52 (H) 0 - 44 IU/L  Lipid Panel w/o Chol/HDL Ratio  Result Value Ref Range   Cholesterol, Total 184 100 - 199 mg/dL   Triglycerides 217 (H) 0 - 149 mg/dL   HDL 32 (L) >39 mg/dL   VLDL Cholesterol Cal 43 (H) 5 - 40 mg/dL   LDL Calculated 109 (H) 0 - 99 mg/dL  Microalbumin, Urine Waived  Result Value Ref Range   Microalb, Ur Waived 30 (H) 0 - 19 mg/L   Creatinine, Urine Waived 200 10 - 300 mg/dL   Microalb/Creat Ratio <30 <30 mg/g  PSA  Result Value Ref Range   Prostate Specific Ag, Serum 0.7 0.0 - 4.0 ng/mL  TSH  Result Value Ref Range   TSH 0.617 0.450 - 4.500 uIU/mL  UA/M w/rflx Culture, Routine  Result Value Ref Range   Specific Gravity, UA 1.020 1.005 - 1.030   pH, UA 7.0 5.0 - 7.5   Color, UA Yellow Yellow   Appearance Ur Clear Clear   Leukocytes, UA Negative Negative   Protein, UA Negative Negative/Trace   Glucose, UA Negative Negative   Ketones, UA Negative Negative   RBC, UA 3+ (A) Negative   Bilirubin, UA Negative Negative   Urobilinogen, Ur 1.0 0.2 - 1.0 mg/dL   Nitrite, UA Negative Negative   Microscopic Examination See below:       Assessment & Plan:   Problem List Items Addressed  This Visit      Cardiovascular and Mediastinum   Migraine    No better. Possibly worse. Still unclear on etiology. ?occipital neuralgia, ? Arthritis associated with chronic pain, ?migraine.  Will get him into neurology for evaluation. Referral generated today.      Relevant Medications   metoprolol succinate (TOPROL-XL) 100 MG 24 hr tablet   Other Relevant Orders   Ambulatory referral to Neurology     Genitourinary   Benign hypertensive renal disease - Primary    Better on recheck, but not under control. Will increase his metoprolol to '100mg'$  daily, HR 80 should tolerate it. Recheck 1 month. Call with any concerns.       Relevant Orders   Basic metabolic panel       Follow up plan: Return in about 4 weeks (around 03/29/2016) for BP follow up.

## 2016-03-01 NOTE — Assessment & Plan Note (Signed)
No better. Possibly worse. Still unclear on etiology. ?occipital neuralgia, ? Arthritis associated with chronic pain, ?migraine. Will get him into neurology for evaluation. Referral generated today.

## 2016-03-25 ENCOUNTER — Ambulatory Visit (INDEPENDENT_AMBULATORY_CARE_PROVIDER_SITE_OTHER): Payer: Medicare Other | Admitting: Neurology

## 2016-03-25 ENCOUNTER — Encounter: Payer: Self-pay | Admitting: Neurology

## 2016-03-25 VITALS — BP 144/86 | HR 72 | Ht 70.0 in | Wt 223.4 lb

## 2016-03-25 DIAGNOSIS — F1190 Opioid use, unspecified, uncomplicated: Secondary | ICD-10-CM

## 2016-03-25 DIAGNOSIS — F17200 Nicotine dependence, unspecified, uncomplicated: Secondary | ICD-10-CM

## 2016-03-25 DIAGNOSIS — G43009 Migraine without aura, not intractable, without status migrainosus: Secondary | ICD-10-CM | POA: Diagnosis not present

## 2016-03-25 MED ORDER — NORTRIPTYLINE HCL 25 MG PO CAPS
25.0000 mg | ORAL_CAPSULE | Freq: Every day | ORAL | 3 refills | Status: DC
Start: 2016-03-25 — End: 2016-08-12

## 2016-03-25 MED ORDER — SUMATRIPTAN SUCCINATE 100 MG PO TABS
ORAL_TABLET | ORAL | 2 refills | Status: DC
Start: 2016-03-25 — End: 2016-11-27

## 2016-03-25 NOTE — Patient Instructions (Signed)
Migraine Recommendations: 1.  Start nortriptyline '25mg'$  at bedtime.  Call in 4 weeks with update and we can adjust dose if needed. 2.  Take sumatriptan '100mg'$  at earliest onset of headache.  May repeat dose once after 2 hours if needed.  Do not exceed two tablets in 24 hours. 3.  Limit use of pain relievers to no more than 2 days out of the week.  These medications include acetaminophen, ibuprofen, triptans and narcotics.  This will help reduce risk of rebound headaches. 4.  Be aware of common food triggers such as processed sweets, processed foods with nitrites (such as deli meat, hot dogs, sausages), foods with MSG, alcohol (such as wine), chocolate, certain cheeses, certain fruits (dried fruits, some citrus fruit), vinegar, diet soda.  STOP ALL SODA 4.  Avoid caffeine 5.  Routine exercise 6.  Proper sleep hygiene 7.  Stay adequately hydrated with water 8.  Keep a headache diary. 9.  Maintain proper stress management. 10.  Do not skip meals. 11.  Consider supplements:  Magnesium citrate '400mg'$  to '600mg'$  daily, riboflavin '400mg'$ , Coenzyme Q 10 '100mg'$  three times daily 12.  Stop smoking 13.  Follow up in 3 months.

## 2016-03-25 NOTE — Progress Notes (Signed)
NEUROLOGY CONSULTATION NOTE  Kristopher [name] MRN: [id number] DOB: 1960-07-27  Referring provider: Park Liter, DO Primary care provider: Park Liter, DO  Reason for consult:  Headache.  HISTORY OF PRESENT ILLNESS: Kristopher [name] is a 56 year old right-handed male with chronic pain syndrome who presents for migraine.  History, including semiology of headaches, supplemented by PCP note.  Onset:  2011, following an accident where he fell off a roof. Location:  Posterior right sided, sometimes left-sided Quality:  pounding Intensity:  10 Aura:  no Prodrome:  no Associated symptoms:  Blurred vision, photophobia, phonophobia, sometimes nausea, no vomiting (only once).  Not new worse headache of life, change in semiology or waking up from sleep Duration:  10 to 15 minutes intermittently up to one hour with sumatriptan (longer without medication) Frequency:  2 to 3 days per week Frequency of abortive medication: sumatriptan as needed.  Takes oxycodone daily for chronic neck and back pain. Triggers/exacerbating factors:  Twisting of neck and back. Relieving factors:  Sumatriptan (takes edge off) Activity:  Light activity aggravated.  Past NSAIDS:  ketorolac Past analgesics:  tramadol, oxycodone Past abortive triptans:  no Past muscle relaxants:  For back pain (can't remember name) Past anti-emetic:  no Past antihypertensive medications:  Multiple for blood pressure (not propranolol or atenolol) Past antidepressant medications:  no Past anticonvulsant medications:  no Past vitamins/Herbal/Supplements:  No Other past therapies:  no  Current NSAIDS:  no Current analgesics:  Oxycodone daily for chronic pain Current triptans:  sumatriptan '50mg'$  Current anti-emetic:  no Current muscle relaxants:  no Current anti-anxiolytic:  no Current sleep aide:  no Current Antihypertensive medications:  amlodipine-valsartan, Torol XL '100mg'$  Current Antidepressant medications:  no Current  Anticonvulsant medications:  no Current Vitamins/Herbal/Supplements:  no Current Antihistamines/Decongestants:  no Other therapy:  no  Caffeine:  no Alcohol:  no Smoker:  yes Diet:  Does not drink water.  Drinks 6 pack of Mt Dew daily Exercise:  no Depression/anxiety:  no Sleep hygiene:  good Family history of headache:  No.  No history of headaches prior to accident.  MRI cervical spine from 04/17/12 revealed "moderate central canal stenosis at C4-5 with small left parasgaittal herniation without change in cord integrity.  Bilateral foraminal narrowing here and to a greater extent C5-6 although there is considerable motion artifact."  CBC    Component Value Date/Time   WBC 5.5 01/22/2016 1017   WBC 18.9 (H) 01/28/2015 0948   RBC 4.92 01/22/2016 1017   RBC 4.71 01/28/2015 0948   HGB 13.9 01/28/2015 0948   HCT 42.2 01/22/2016 1017   PLT 212 01/22/2016 1017   MCV 86 01/22/2016 1017   MCH 29.9 01/22/2016 1017   MCH 29.5 01/28/2015 0948   MCHC 34.8 01/22/2016 1017   MCHC 34.3 01/28/2015 0948   RDW 14.2 01/22/2016 1017   LYMPHSABS 2.2 01/22/2016 1017   MONOABS 1.2 (H) 01/28/2015 0948   EOSABS 0.1 01/22/2016 1017   BASOSABS 0.0 01/22/2016 1017   CMP Latest Ref Rng & Units 01/22/2016 04/03/2015 01/28/2015  Glucose 65 - 99 mg/dL 133(H) 104(H) 104(H)  BUN 6 - 24 mg/dL '10 12 12  '$ Creatinine 0.76 - 1.27 mg/dL 0.88 0.83 1.16  Sodium 134 - 144 mmol/L 140 139 137  Potassium 3.5 - 5.2 mmol/L 3.8 4.0 3.3(L)  Chloride 96 - 106 mmol/L 101 106 106  CO2 18 - 29 mmol/L '23 24 22  '$ Calcium 8.7 - 10.2 mg/dL 9.3 9.4 9.3  Total Protein 6.0 - 8.5 g/dL 7.0  8.0 -  Total Bilirubin 0.0 - 1.2 mg/dL 0.4 0.5 -  Alkaline Phos 39 - 117 IU/L 104 109 -  AST 0 - 40 IU/L 38 46(H) -  ALT 0 - 44 IU/L 52(H) 77(H) -    PAST MEDICAL HISTORY: Past Medical History:  Diagnosis Date  . Allergy   . Arthritis    left foot  . Chronic back pain    Four rods in back  . Hypertension   . Migraines    daily     PAST SURGICAL HISTORY: Past Surgical History:  Procedure Laterality Date  . APPENDECTOMY    . BACK SURGERY    . COLONOSCOPY WITH PROPOFOL N/A 02/19/2016   Procedure: COLONOSCOPY WITH PROPOFOL;  Surgeon: Lucilla Lame, MD;  Location: [facility];  Service: Endoscopy;  Laterality: N/A;  . FOOT SURGERY Left    Screws and plates  . KNEE SURGERY Left    X 2  . LEG SURGERY    . POLYPECTOMY N/A 02/19/2016   Procedure: POLYPECTOMY;  Surgeon: Lucilla Lame, MD;  Location: [facility];  Service: Endoscopy;  Laterality: N/A;    MEDICATIONS: Current Outpatient Prescriptions on File Prior to Visit  Medication Sig Dispense Refill  . amLODipine-valsartan (EXFORGE) 10-320 MG tablet Take 1 tablet by mouth daily. 90 tablet 1  . metoprolol succinate (TOPROL-XL) 100 MG 24 hr tablet Take 1 tablet (100 mg total) by mouth daily. Take with or immediately following a meal. 30 tablet 1  . Naloxone HCl (NARCAN) 4 MG/0.1ML LIQD Place 1 Bottle into the nose once. , then call 911, repeat if needed in other nostril with new bottle. 2 each 0  . NONFORMULARY OR COMPOUNDED ITEM 10% Ketamine/2% Cyclobenzaprine/6% Gabapentin Cream Sig: 1-2 ml to affected area 3-4 times/day. Amount: 240 GM 240 each 2  . erythromycin (ERY-TAB) 333 MG EC tablet Take 1 tablet (333 mg total) by mouth 3 (three) times daily. (Patient not taking: Reported on 03/25/2016) 30 tablet 0  . Oxycodone HCl 10 MG TABS Take 1 tablet (10 mg total) by mouth every 6 (six) hours as needed. 120 tablet 0   No current facility-administered medications on file prior to visit.     ALLERGIES: Allergies  Allergen Reactions  . Lidocaine Anaphylaxis  . Penicillins Anaphylaxis  . Strawberry (Diagnostic) Anaphylaxis  . Aspirin     Feels like his throat is closing up  . Novocain [Procaine] Swelling    FAMILY HISTORY: Family History  Problem Relation Age of Onset  . Cancer Father   . Diabetes Sister   . Thrombosis Sister     SOCIAL  HISTORY: Social History   Social History  . Marital status: Significant Other    Spouse name: N/A  . Number of children: N/A  . Years of education: N/A   Occupational History  . Not on file.   Social History Main Topics  . Smoking status: Current Some Day Smoker    Packs/day: 0.75    Years: 35.00    Types: Cigarettes  . Smokeless tobacco: Never Used  . Alcohol use No  . Drug use: No  . Sexual activity: Not on file   Other Topics Concern  . Not on file   Social History Narrative  . No narrative on file    REVIEW OF SYSTEMS: Constitutional: No fevers, chills, or sweats, no generalized fatigue, change in appetite Eyes: No visual changes, double vision, eye pain Ear, nose and throat: No hearing loss, ear pain, nasal congestion, sore  throat Cardiovascular: No chest pain, palpitations Respiratory:  No shortness of breath at rest or with exertion, wheezes GastrointestinaI: No nausea, vomiting, diarrhea, abdominal pain, fecal incontinence Genitourinary:  No dysuria, urinary retention or frequency Musculoskeletal:  No neck pain, back pain Integumentary: No rash, pruritus, skin lesions Neurological: as above Psychiatric: No depression, insomnia, anxiety Endocrine: No palpitations, fatigue, diaphoresis, mood swings, change in appetite, change in weight, increased thirst Hematologic/Lymphatic:  No purpura, petechiae. Allergic/Immunologic: no itchy/runny eyes, nasal congestion, recent allergic reactions, rashes  PHYSICAL EXAM: Vitals:   03/25/16 0741  BP: (!) 144/86  Pulse: 72   General: No acute distress.  Patient appears well-groomed.  Head:  Normocephalic/atraumatic Eyes:  fundi examined but not visualized Neck: supple, bilateral paraspinal tenderness, full range of motion Back: bilateral paraspinal tenderness Heart: regular rate and rhythm Lungs: Clear to auscultation bilaterally. Vascular: No carotid bruits. Neurological Exam: Mental status: alert and oriented to  person, place, and time, recent and remote memory intact, fund of knowledge intact, attention and concentration intact, speech fluent and not dysarthric, language intact. Cranial nerves: CN I: not tested CN II: pupils equal, round and reactive to light, visual fields intact CN III, IV, VI:  full range of motion, no nystagmus, no ptosis CN V: facial sensation intact CN VII: upper and lower face symmetric CN VIII: hearing intact CN IX, X: gag intact, uvula midline CN XI: sternocleidomastoid and trapezius muscles intact CN XII: tongue midline Bulk & Tone: normal, no fasciculations. Motor:  5/5 throughout  Sensation:  temperature and vibration sensation intact. Deep Tendon Reflexes:  2+ throughout, toes downgoing.  Finger to nose testing:  Without dysmetria.  Heel to shin:  Without dysmetria.  Gait:  Antalgic gait. Romberg negative.  IMPRESSION: Migraine without aura Tobacco use Chronic opioid use HTN, borderline elevated today  PLAN: 1.  Start nortriptyline '25mg'$  at bedtime.  Call in 4 weeks with update and we can adjust dose if needed. 2.  Take sumatriptan '100mg'$  at earliest onset of headache.  May repeat dose once after 2 hours if needed.  Do not exceed two tablets in 24 hours. 3.  Limit use of pain relievers to no more than 2 days out of the week.  These medications include acetaminophen, ibuprofen, triptans and narcotics.  This will help reduce risk of rebound headaches. 4.  Be aware of common food triggers such as processed sweets, processed foods with nitrites (such as deli meat, hot dogs, sausages), foods with MSG, alcohol (such as wine), chocolate, certain cheeses, certain fruits (dried fruits, some citrus fruit), vinegar, diet soda.  STOP ALL SODA 4.  Avoid caffeine 5.  Routine exercise 6.  Proper sleep hygiene 7.  Stay adequately hydrated with water 8.  Keep a headache diary. 9.  Maintain proper stress management. 10.  Do not skip meals. 11.  Consider supplements:  Magnesium  citrate '400mg'$  to '600mg'$  daily, riboflavin '400mg'$ , Coenzyme Q 10 '100mg'$  three times daily 12.  discussed smoking cessation 13.  Follow up BP with PCP 14.  Follow up in 3 months.  Thank you for allowing me to take part in the care of this patient.  Metta Clines, DO  CC:  Park Liter, DO

## 2016-04-01 ENCOUNTER — Ambulatory Visit (INDEPENDENT_AMBULATORY_CARE_PROVIDER_SITE_OTHER): Payer: Medicare Other | Admitting: Family Medicine

## 2016-04-01 ENCOUNTER — Encounter: Payer: Self-pay | Admitting: Family Medicine

## 2016-04-01 VITALS — BP 149/92 | HR 64 | Temp 98.2°F | Wt 219.3 lb

## 2016-04-01 DIAGNOSIS — J181 Lobar pneumonia, unspecified organism: Principal | ICD-10-CM

## 2016-04-01 DIAGNOSIS — I129 Hypertensive chronic kidney disease with stage 1 through stage 4 chronic kidney disease, or unspecified chronic kidney disease: Secondary | ICD-10-CM | POA: Diagnosis not present

## 2016-04-01 DIAGNOSIS — J189 Pneumonia, unspecified organism: Secondary | ICD-10-CM | POA: Diagnosis not present

## 2016-04-01 MED ORDER — DOXYCYCLINE HYCLATE 100 MG PO TABS: 100.0000 mg | ORAL_TABLET | Freq: Two times a day (BID) | ORAL | 0 refills | Status: DC

## 2016-04-01 MED ORDER — ALBUTEROL SULFATE (2.5 MG/3ML) 0.083% IN NEBU: 2.5000 mg | INHALATION_SOLUTION | Freq: Once | RESPIRATORY_TRACT | Status: DC

## 2016-04-01 MED ORDER — PREDNISONE 10 MG PO TABS: ORAL_TABLET | ORAL | 0 refills | Status: DC

## 2016-04-01 MED ORDER — METOPROLOL SUCCINATE ER 100 MG PO TB24
100.0000 mg | ORAL_TABLET | Freq: Every day | ORAL | 1 refills | Status: DC
Start: 2016-04-01 — End: 2016-06-24

## 2016-04-01 NOTE — Assessment & Plan Note (Signed)
Slightly elevated today but has been taking sudafed. Will continue current regimen. Recheck in 2 weeks at lung recheck.

## 2016-04-01 NOTE — Patient Instructions (Signed)
Take 1 tab BID of your antibiotic 6 tabs today of your prednisone, 5 tabs tomorrow, decrease by 1 every day until gone

## 2016-04-01 NOTE — Progress Notes (Signed)
BP (!) 149/92 (BP Location: Left Arm, Patient Position: Sitting, Cuff Size: Large)   Pulse 64   Temp 98.2 F (36.8 C)   Wt 219 lb 4.8 oz (99.5 kg)   SpO2 98%   BMI 31.47 kg/m    Subjective:    Patient ID: Kristopher [name], male    DOB: 1960/07/17, 56 y.o.   MRN: [id number]  HPI: Konnar Ben is a 56 y.o. male  Chief Complaint  Patient presents with  . Hypertension  . Cough    Patient states that he has had a cough, headcahe and stomachache since Wednesday of last week.    UPPER RESPIRATORY TRACT INFECTION Duration: Since Wednesday Worst symptom: Coughing Fever: yes Cough: yes Shortness of breath: yes Wheezing: yes Chest pain: yes, with cough Chest tightness: yes Chest congestion: yes Nasal congestion: yes Runny nose: yes Post nasal drip: yes Sneezing: no Sore throat: no Swollen glands: no Sinus pressure: no Headache: yes Face pain: no Toothache: no Ear pain: yes, L  Ear pressure: yes left Eyes red/itching:yes Eye drainage/crusting: yes  Vomiting: yes Rash: no Fatigue: yes Sick contacts: yes Strep contacts: no  Context: stable Recurrent sinusitis: no Relief with OTC cold/cough medications: no  Treatments attempted: cold/sinus, mucinex, anti-histamine, pseudoephedrine and cough syrup   HYPERTENSION Hypertension status: better  Satisfied with current treatment? yes Duration of hypertension: chronic BP monitoring frequency:  not checking BP medication side effects:  no Medication compliance: excellent compliance Previous BP meds: metoprolol and amlodpine,  Aspirin: no Recurrent headaches: yes Visual changes: no Palpitations: no Dyspnea: no Chest pain: no Lower extremity edema: no Dizzy/lightheaded: no  Relevant past medical, surgical, family and social history reviewed and updated as indicated. Interim medical history since our last visit reviewed. Allergies and medications reviewed and updated.  Review of Systems  Constitutional: Positive  for fever. Negative for activity change, appetite change, chills, diaphoresis, fatigue and unexpected weight change.  HENT: Positive for congestion, ear pain, postnasal drip, rhinorrhea, sinus pain and sinus pressure. Negative for dental problem, drooling, ear discharge, facial swelling, hearing loss, mouth sores, nosebleeds, sneezing, sore throat, tinnitus, trouble swallowing and voice change.   Eyes: Negative.   Respiratory: Positive for cough, chest tightness, shortness of breath and wheezing. Negative for apnea, choking and stridor.   Cardiovascular: Negative.   Psychiatric/Behavioral: Negative.     Per HPI unless specifically indicated above     Objective:    BP (!) 149/92 (BP Location: Left Arm, Patient Position: Sitting, Cuff Size: Large)   Pulse 64   Temp 98.2 F (36.8 C)   Wt 219 lb 4.8 oz (99.5 kg)   SpO2 98%   BMI 31.47 kg/m   Wt Readings from Last 3 Encounters:  04/01/16 219 lb 4.8 oz (99.5 kg)  03/25/16 223 lb 7 oz (101.4 kg)  03/01/16 223 lb (101.2 kg)    Physical Exam  Constitutional: He is oriented to person, place, and time. He appears well-developed and well-nourished. No distress.  HENT:  Head: Normocephalic and atraumatic.  Right Ear: Hearing normal.  Left Ear: Hearing normal.  Nose: Nose normal.  Eyes: Conjunctivae and lids are normal. Right eye exhibits no discharge. Left eye exhibits no discharge. No scleral icterus.  Cardiovascular: Normal rate, regular rhythm, normal heart sounds and intact distal pulses.  Exam reveals no gallop and no friction rub.   No murmur heard. Pulmonary/Chest: Effort normal. No respiratory distress. He has decreased breath sounds. He has wheezes in the right upper field, the right middle  field, the right lower field, the left upper field, the left middle field and the left lower field. He has rhonchi in the right lower field and the left lower field. He has no rales. He exhibits no tenderness.  Musculoskeletal: Normal range of  motion.  Neurological: He is alert and oriented to person, place, and time.  Skin: Skin is warm, dry and intact. No rash noted. No erythema. No pallor.  Psychiatric: He has a normal mood and affect. His speech is normal and behavior is normal. Judgment and thought content normal. Cognition and memory are normal.  Nursing note and vitals reviewed.   Results for orders placed or performed in visit on 01/22/16  Microscopic Examination  Result Value Ref Range   WBC, UA None seen 0 - 5 /hpf   RBC, UA 3-10 (A) 0 - 2 /hpf   Epithelial Cells (non renal) 0-10 0 - 10 /hpf  CBC with Differential/Platelet  Result Value Ref Range   WBC 5.5 3.4 - 10.8 x10E3/uL   RBC 4.92 4.14 - 5.80 x10E6/uL   Hemoglobin 14.7 12.6 - 17.7 g/dL   Hematocrit 42.2 37.5 - 51.0 %   MCV 86 79 - 97 fL   MCH 29.9 26.6 - 33.0 pg   MCHC 34.8 31.5 - 35.7 g/dL   RDW 14.2 12.3 - 15.4 %   Platelets 212 150 - 379 x10E3/uL   Neutrophils 50 Not Estab. %   Lymphs 40 Not Estab. %   Monocytes 7 Not Estab. %   Eos 2 Not Estab. %   Basos 1 Not Estab. %   Neutrophils Absolute 2.8 1.4 - 7.0 x10E3/uL   Lymphocytes Absolute 2.2 0.7 - 3.1 x10E3/uL   Monocytes Absolute 0.4 0.1 - 0.9 x10E3/uL   EOS (ABSOLUTE) 0.1 0.0 - 0.4 x10E3/uL   Basophils Absolute 0.0 0.0 - 0.2 x10E3/uL   Immature Granulocytes 0 Not Estab. %   Immature Grans (Abs) 0.0 0.0 - 0.1 x10E3/uL  Comprehensive metabolic panel  Result Value Ref Range   Glucose 133 (H) 65 - 99 mg/dL   BUN 10 6 - 24 mg/dL   Creatinine, Ser 0.88 0.76 - 1.27 mg/dL   GFR calc non Af Amer 98 >59 mL/min/1.73   GFR calc Af Amer 113 >59 mL/min/1.73   BUN/Creatinine Ratio 11 9 - 20   Sodium 140 134 - 144 mmol/L   Potassium 3.8 3.5 - 5.2 mmol/L   Chloride 101 96 - 106 mmol/L   CO2 23 18 - 29 mmol/L   Calcium 9.3 8.7 - 10.2 mg/dL   Total Protein 7.0 6.0 - 8.5 g/dL   Albumin 4.7 3.5 - 5.5 g/dL   Globulin, Total 2.3 1.5 - 4.5 g/dL   Albumin/Globulin Ratio 2.0 1.2 - 2.2   Bilirubin Total 0.4  0.0 - 1.2 mg/dL   Alkaline Phosphatase 104 39 - 117 IU/L   AST 38 0 - 40 IU/L   ALT 52 (H) 0 - 44 IU/L  Lipid Panel w/o Chol/HDL Ratio  Result Value Ref Range   Cholesterol, Total 184 100 - 199 mg/dL   Triglycerides 217 (H) 0 - 149 mg/dL   HDL 32 (L) >39 mg/dL   VLDL Cholesterol Cal 43 (H) 5 - 40 mg/dL   LDL Calculated 109 (H) 0 - 99 mg/dL  Microalbumin, Urine Waived  Result Value Ref Range   Microalb, Ur Waived 30 (H) 0 - 19 mg/L   Creatinine, Urine Waived 200 10 - 300 mg/dL   Microalb/Creat Ratio <30 <  30 mg/g  PSA  Result Value Ref Range   Prostate Specific Ag, Serum 0.7 0.0 - 4.0 ng/mL  TSH  Result Value Ref Range   TSH 0.617 0.450 - 4.500 uIU/mL  UA/M w/rflx Culture, Routine  Result Value Ref Range   Specific Gravity, UA 1.020 1.005 - 1.030   pH, UA 7.0 5.0 - 7.5   Color, UA Yellow Yellow   Appearance Ur Clear Clear   Leukocytes, UA Negative Negative   Protein, UA Negative Negative/Trace   Glucose, UA Negative Negative   Ketones, UA Negative Negative   RBC, UA 3+ (A) Negative   Bilirubin, UA Negative Negative   Urobilinogen, Ur 1.0 0.2 - 1.0 mg/dL   Nitrite, UA Negative Negative   Microscopic Examination See below:       Assessment & Plan:   Problem List Items Addressed This Visit      Genitourinary   Benign hypertensive renal disease    Slightly elevated today but has been taking sudafed. Will continue current regimen. Recheck in 2 weeks at lung recheck.       Other Visit Diagnoses    Pneumonia of both lower lobes due to infectious organism    -  Primary   Slightly better following neb. Will treat with doxycycline and prednisone. Recheck 2 weeks. Call with any concerns.     Relevant Medications   albuterol (PROVENTIL) (2.5 MG/3ML) 0.083% nebulizer solution 2.5 mg   doxycycline (VIBRA-TABS) 100 MG tablet       Follow up plan: Return in about 2 weeks (around 04/15/2016) for Recheck lungs.

## 2016-04-15 ENCOUNTER — Ambulatory Visit (INDEPENDENT_AMBULATORY_CARE_PROVIDER_SITE_OTHER): Payer: Medicare Other | Admitting: Family Medicine

## 2016-04-15 ENCOUNTER — Encounter: Payer: Self-pay | Admitting: Family Medicine

## 2016-04-15 VITALS — BP 136/76 | HR 66 | Temp 98.3°F | Wt 222.0 lb

## 2016-04-15 DIAGNOSIS — R05 Cough: Secondary | ICD-10-CM | POA: Diagnosis not present

## 2016-04-15 DIAGNOSIS — J181 Lobar pneumonia, unspecified organism: Secondary | ICD-10-CM

## 2016-04-15 DIAGNOSIS — J45909 Unspecified asthma, uncomplicated: Secondary | ICD-10-CM | POA: Diagnosis not present

## 2016-04-15 DIAGNOSIS — J189 Pneumonia, unspecified organism: Secondary | ICD-10-CM | POA: Diagnosis not present

## 2016-04-15 DIAGNOSIS — R059 Cough, unspecified: Secondary | ICD-10-CM

## 2016-04-15 MED ORDER — ALBUTEROL SULFATE HFA 108 (90 BASE) MCG/ACT IN AERS: 2.0000 | INHALATION_SPRAY | Freq: Four times a day (QID) | RESPIRATORY_TRACT | 0 refills | Status: DC | PRN

## 2016-04-15 MED ORDER — AMLODIPINE BESYLATE-VALSARTAN 10-320 MG PO TABS: 1.0000 | ORAL_TABLET | Freq: Every day | ORAL | 1 refills | Status: DC

## 2016-04-15 MED ORDER — FLUTICASONE-SALMETEROL 250-50 MCG/DOSE IN AEPB: 1.0000 | INHALATION_SPRAY | Freq: Two times a day (BID) | RESPIRATORY_TRACT | 3 refills | Status: DC

## 2016-04-15 NOTE — Patient Instructions (Addendum)

## 2016-04-15 NOTE — Progress Notes (Signed)
BP 136/76 (BP Location: Left Arm, Patient Position: Sitting, Cuff Size: Large)   Pulse 66   Temp 98.3 F (36.8 C)   Wt 222 lb (100.7 kg)   SpO2 95%   BMI 31.85 kg/m    Subjective:    Patient ID: Kristopher [name], male    DOB: 01/09/1961, 56 y.o.   MRN: [id number]  HPI: Kristopher [name] is a 56 y.o. male  Chief Complaint  Patient presents with  . Lung Recheck    Patient states he's a lot better than he was, still not 100%   Feeling better, but not 100%, still coughing at night, and in the AM still feeling tight. No fevers, some sweats. Still coughing.   Relevant past medical, surgical, family and social history reviewed and updated as indicated. Interim medical history since our last visit reviewed. Allergies and medications reviewed and updated.  Review of Systems  Constitutional: Negative.   HENT: Negative.   Respiratory: Positive for cough. Negative for apnea, choking, chest tightness, shortness of breath, wheezing and stridor.   Cardiovascular: Negative.   Psychiatric/Behavioral: Negative.     Per HPI unless specifically indicated above     Objective:    BP 136/76 (BP Location: Left Arm, Patient Position: Sitting, Cuff Size: Large)   Pulse 66   Temp 98.3 F (36.8 C)   Wt 222 lb (100.7 kg)   SpO2 95%   BMI 31.85 kg/m   Wt Readings from Last 3 Encounters:  04/15/16 222 lb (100.7 kg)  04/01/16 219 lb 4.8 oz (99.5 kg)  03/25/16 223 lb 7 oz (101.4 kg)    Physical Exam  Constitutional: He is oriented to person, place, and time. He appears well-developed and well-nourished. No distress.  HENT:  Head: Normocephalic and atraumatic.  Right Ear: Hearing normal.  Left Ear: Hearing normal.  Nose: Nose normal.  Eyes: Conjunctivae and lids are normal. Right eye exhibits no discharge. Left eye exhibits no discharge. No scleral icterus.  Cardiovascular: Normal rate, regular rhythm, normal heart sounds and intact distal pulses.  Exam reveals no gallop and no friction rub.    No murmur heard. Pulmonary/Chest: Effort normal and breath sounds normal. No respiratory distress. He has no wheezes. He has no rales. He exhibits no tenderness.  Musculoskeletal: Normal range of motion.  Neurological: He is alert and oriented to person, place, and time.  Skin: Skin is warm, dry and intact. No rash noted. He is not diaphoretic. No erythema. No pallor.  Psychiatric: He has a normal mood and affect. His speech is normal and behavior is normal. Judgment and thought content normal. Cognition and memory are normal.  Nursing note and vitals reviewed.   Results for orders placed or performed in visit on 01/22/16  Microscopic Examination  Result Value Ref Range   WBC, UA None seen 0 - 5 /hpf   RBC, UA 3-10 (A) 0 - 2 /hpf   Epithelial Cells (non renal) 0-10 0 - 10 /hpf  CBC with Differential/Platelet  Result Value Ref Range   WBC 5.5 3.4 - 10.8 x10E3/uL   RBC 4.92 4.14 - 5.80 x10E6/uL   Hemoglobin 14.7 12.6 - 17.7 g/dL   Hematocrit 42.2 37.5 - 51.0 %   MCV 86 79 - 97 fL   MCH 29.9 26.6 - 33.0 pg   MCHC 34.8 31.5 - 35.7 g/dL   RDW 14.2 12.3 - 15.4 %   Platelets 212 150 - 379 x10E3/uL   Neutrophils 50 Not Estab. %   Lymphs  40 Not Estab. %   Monocytes 7 Not Estab. %   Eos 2 Not Estab. %   Basos 1 Not Estab. %   Neutrophils Absolute 2.8 1.4 - 7.0 x10E3/uL   Lymphocytes Absolute 2.2 0.7 - 3.1 x10E3/uL   Monocytes Absolute 0.4 0.1 - 0.9 x10E3/uL   EOS (ABSOLUTE) 0.1 0.0 - 0.4 x10E3/uL   Basophils Absolute 0.0 0.0 - 0.2 x10E3/uL   Immature Granulocytes 0 Not Estab. %   Immature Grans (Abs) 0.0 0.0 - 0.1 x10E3/uL  Comprehensive metabolic panel  Result Value Ref Range   Glucose 133 (H) 65 - 99 mg/dL   BUN 10 6 - 24 mg/dL   Creatinine, Ser 0.88 0.76 - 1.27 mg/dL   GFR calc non Af Amer 98 >59 mL/min/1.73   GFR calc Af Amer 113 >59 mL/min/1.73   BUN/Creatinine Ratio 11 9 - 20   Sodium 140 134 - 144 mmol/L   Potassium 3.8 3.5 - 5.2 mmol/L   Chloride 101 96 - 106 mmol/L    CO2 23 18 - 29 mmol/L   Calcium 9.3 8.7 - 10.2 mg/dL   Total Protein 7.0 6.0 - 8.5 g/dL   Albumin 4.7 3.5 - 5.5 g/dL   Globulin, Total 2.3 1.5 - 4.5 g/dL   Albumin/Globulin Ratio 2.0 1.2 - 2.2   Bilirubin Total 0.4 0.0 - 1.2 mg/dL   Alkaline Phosphatase 104 39 - 117 IU/L   AST 38 0 - 40 IU/L   ALT 52 (H) 0 - 44 IU/L  Lipid Panel w/o Chol/HDL Ratio  Result Value Ref Range   Cholesterol, Total 184 100 - 199 mg/dL   Triglycerides 217 (H) 0 - 149 mg/dL   HDL 32 (L) >39 mg/dL   VLDL Cholesterol Cal 43 (H) 5 - 40 mg/dL   LDL Calculated 109 (H) 0 - 99 mg/dL  Microalbumin, Urine Waived  Result Value Ref Range   Microalb, Ur Waived 30 (H) 0 - 19 mg/L   Creatinine, Urine Waived 200 10 - 300 mg/dL   Microalb/Creat Ratio <30 <30 mg/g  PSA  Result Value Ref Range   Prostate Specific Ag, Serum 0.7 0.0 - 4.0 ng/mL  TSH  Result Value Ref Range   TSH 0.617 0.450 - 4.500 uIU/mL  UA/M w/rflx Culture, Routine  Result Value Ref Range   Specific Gravity, UA 1.020 1.005 - 1.030   pH, UA 7.0 5.0 - 7.5   Color, UA Yellow Yellow   Appearance Ur Clear Clear   Leukocytes, UA Negative Negative   Protein, UA Negative Negative/Trace   Glucose, UA Negative Negative   Ketones, UA Negative Negative   RBC, UA 3+ (A) Negative   Bilirubin, UA Negative Negative   Urobilinogen, Ur 1.0 0.2 - 1.0 mg/dL   Nitrite, UA Negative Negative   Microscopic Examination See below:       Assessment & Plan:   Problem List Items Addressed This Visit    None    Visit Diagnoses    Mild reactive airways disease, unspecified whether persistent    -  Primary   Unclear if just from pneumonia. Will treat with advair and albuterol and recheck 1 month.    Cough       Restriction on spiro. Will start advair and albuterol PRN. Recheck 1 month.    Relevant Orders   Spirometry with Graph (Completed)   Pneumonia of both lower lobes due to infectious organism       Resolved.   Relevant Medications  albuterol (PROVENTIL  HFA;VENTOLIN HFA) 108 (90 Base) MCG/ACT inhaler   Fluticasone-Salmeterol (ADVAIR DISKUS) 250-50 MCG/DOSE AEPB       Follow up plan: Return in about 4 weeks (around 05/13/2016) for Recheck lungs.

## 2016-04-16 ENCOUNTER — Encounter: Payer: Self-pay | Admitting: Pain Medicine

## 2016-04-16 ENCOUNTER — Ambulatory Visit: Payer: Medicare Other | Attending: Pain Medicine | Admitting: Pain Medicine

## 2016-04-16 VITALS — BP 154/95 | HR 77 | Temp 98.1°F | Resp 16 | Ht 70.0 in | Wt 220.0 lb

## 2016-04-16 DIAGNOSIS — K621 Rectal polyp: Secondary | ICD-10-CM | POA: Diagnosis not present

## 2016-04-16 DIAGNOSIS — Z886 Allergy status to analgesic agent status: Secondary | ICD-10-CM | POA: Insufficient documentation

## 2016-04-16 DIAGNOSIS — Z888 Allergy status to other drugs, medicaments and biological substances status: Secondary | ICD-10-CM | POA: Diagnosis not present

## 2016-04-16 DIAGNOSIS — Z880 Allergy status to penicillin: Secondary | ICD-10-CM | POA: Diagnosis not present

## 2016-04-16 DIAGNOSIS — M545 Low back pain: Secondary | ICD-10-CM | POA: Insufficient documentation

## 2016-04-16 DIAGNOSIS — N189 Chronic kidney disease, unspecified: Secondary | ICD-10-CM | POA: Insufficient documentation

## 2016-04-16 DIAGNOSIS — F17210 Nicotine dependence, cigarettes, uncomplicated: Secondary | ICD-10-CM | POA: Diagnosis not present

## 2016-04-16 DIAGNOSIS — Z79899 Other long term (current) drug therapy: Secondary | ICD-10-CM | POA: Insufficient documentation

## 2016-04-16 DIAGNOSIS — G894 Chronic pain syndrome: Secondary | ICD-10-CM | POA: Diagnosis not present

## 2016-04-16 DIAGNOSIS — E876 Hypokalemia: Secondary | ICD-10-CM | POA: Insufficient documentation

## 2016-04-16 DIAGNOSIS — Z79891 Long term (current) use of opiate analgesic: Secondary | ICD-10-CM | POA: Insufficient documentation

## 2016-04-16 DIAGNOSIS — M25552 Pain in left hip: Secondary | ICD-10-CM | POA: Insufficient documentation

## 2016-04-16 DIAGNOSIS — M4802 Spinal stenosis, cervical region: Secondary | ICD-10-CM | POA: Insufficient documentation

## 2016-04-16 DIAGNOSIS — I129 Hypertensive chronic kidney disease with stage 1 through stage 4 chronic kidney disease, or unspecified chronic kidney disease: Secondary | ICD-10-CM | POA: Insufficient documentation

## 2016-04-16 MED ORDER — OXYCODONE HCL 10 MG PO TABS: 10.0000 mg | ORAL_TABLET | Freq: Four times a day (QID) | ORAL | 0 refills | Status: DC | PRN

## 2016-04-16 MED ORDER — NONFORMULARY OR COMPOUNDED ITEM: 2 refills | Status: DC

## 2016-04-16 NOTE — Progress Notes (Signed)
Nursing Pain Medication Assessment:  Safety precautions to be maintained throughout the outpatient stay will include: orient to surroundings, keep bed in low position, maintain call bell within reach at all times, provide assistance with transfer out of bed and ambulation.  Medication Inspection Compliance: Pill count conducted under aseptic conditions, in front of the patient. Neither the pills nor the bottle was removed from the patient's sight at any time. Once count was completed pills were immediately returned to the patient in their original bottle.  Medication: Oxycodone IR Pill/Patch Count: 59 of 120 pills remain Bottle Appearance: Standard pharmacy container. Clearly labeled. Filled Date: 01 /27/ 2018 Last Medication intake:  Today

## 2016-04-16 NOTE — Progress Notes (Signed)
Patient's Name: Kristopher [name]  MRN: [id number]  Referring Provider: Freddy Finner, NP  DOB: 1961-02-07  PCP: [name], DO  DOS: 04/16/2016  Note by: Kathlen Brunswick. Dossie Arbour, MD  Service setting: Ambulatory outpatient  Specialty: Interventional Pain Management  Location: [facility] (AMB) Pain Management Facility    Patient type: Established   Primary Reason(s) for Visit: Encounter for prescription drug management (Level of risk: moderate) CC: Back Pain (lower) and Leg Pain (left side of the leg to the knee)  HPI  Kristopher [name] is a 56 y.o. year old, male patient, who comes today for a medication management evaluation. He has Chronic low back pain (Location of Primary Source of Pain) (Bilateral) (L>R); Lumbar spondylosis; Chronic lumbar radicular pain (Location of Secondary source of pain) (S1 dermatomal) (Left); 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); Chronic neck pain (posterior midline) (Bilateral) (L>R); Cervical spondylosis; Chronic cervical radicular pain (Location of Tertiary source of pain) (Bilateral) (C5/C6 dermatome) (L>R); Long term current use of opiate analgesic; Long term prescription opiate use; Opiate use (60 MME/Day); Opioid dependence ([place]); Encounter for therapeutic drug level monitoring; Cervical spinal stenosis (C4-5); Cervical foraminal stenosis (Bilateral C5-6); Hypokalemia; Retrolisthesis of L5-S1; Cervical disc herniation (C4-5 and C5-6); Lumbar disc herniation (L5-S1); Chronic sacroiliac joint pain (Bilateral) (L>R); Allergy history, anesthetic (Unconfirmed allergy to Lidocaine); Chronic hip pain (Left); Lumbar facet syndrome (Location of Primary Source of Pain) (Bilateral) (L>R); Chronic lower extremity pain (Location of Secondary source of pain) (Left); Chronic upper extremity pain (Location of Tertiary source of pain) (Bilateral) (L>R); Greater occipital neuralgia (Right); Chronic pain syndrome; Benign hypertensive renal disease; Migraine; Special screening  for malignant neoplasms, colon; Benign neoplasm of ascending colon; Polyp of sigmoid colon; and Rectal polyp on his problem list. His primarily concern today is the Back Pain (lower) and Leg Pain (left side of the leg to the knee)  Pain Assessment: Self-Reported Pain Score: 6 /10 Clinically the patient looks like a 2/10 Reported level is inconsistent with clinical observations. Information on the proper use of the pain scale provided to the patient today Pain Type: Chronic pain Pain Location: Back Pain Orientation: Lower Pain Descriptors / Indicators: Aching, Constant, Jabbing, Pins and needles, Penetrating Pain Frequency: Intermittent (pain lasts approx 5 min then goes away)  [name] was last seen on 02/05/2016 for medication management. During today's appointment we reviewed Mr. Cutsforth chronic pain status, as well as his outpatient medication regimen.  The patient  reports that he does not use drugs. His body mass index is 31.57 kg/m.  Further details on both, my assessment(s), as well as the proposed treatment plan, please see below.  Controlled Substance Pharmacotherapy Assessment REMS (Risk Evaluation and Mitigation Strategy)  Analgesic:Oxycodone IR 10 mg every 6 hours (40 mg/day of oxycodone) MME/day:60 mg/day  Evon Slack, RN  04/16/2016 10:20 AM  Sign at close encounter Nursing Pain Medication Assessment:  Safety precautions to be maintained throughout the outpatient stay will include: orient to surroundings, keep bed in low position, maintain call bell within reach at all times, provide assistance with transfer out of bed and ambulation.  Medication Inspection Compliance: Pill count conducted under aseptic conditions, in front of the patient. Neither the pills nor the bottle was removed from the patient's sight at any time. Once count was completed pills were immediately returned to the patient in their original bottle.  Medication: Oxycodone IR Pill/Patch Count: 59 of 120  pills remain Bottle Appearance: Standard pharmacy container. Clearly labeled. Filled Date: 01 /27/ 2018 Last  Medication intake:  Today   Pharmacokinetics: Liberation and absorption (onset of action): WNL Distribution (time to peak effect): WNL Metabolism and excretion (duration of action): WNL         Pharmacodynamics: Desired effects: Analgesia: [name] reports >50% benefit. Functional ability: Patient reports that medication allows him to accomplish basic ADLs Clinically meaningful improvement in function (CMIF): Sustained CMIF goals met Perceived effectiveness: Described as relatively effective, allowing for increase in activities of daily living (ADL) Undesirable effects: Side-effects or Adverse reactions: None reported Monitoring: [place] PMP: Online review of the past 46-monthperiod conducted. Compliant with practice rules and regulations List of all UDS test(s) done:  Lab Results  Component Value Date   TOXASSSELUR FINAL 01/11/2016   [place] 05/[place]/2015   [place] 04/03/2015   TOXASSSELUR FINAL 02/01/2015   [place]FINAL 01/05/2015   Last UDS on record: ToxAssure Select 13  Date Value Ref Range Status  01/11/2016 FINAL  Final    Comment:    ==================================================================== TOXASSURE SELECT 13 (MW) ==================================================================== Test                             Result       Flag       Units Drug Present and Declared for Prescription Verification   Oxycodone                      80           EXPECTED   ng/mg creat   Oxymorphone                    289          EXPECTED   ng/mg creat   Noroxycodone                   161          EXPECTED   ng/mg creat    Sources of oxycodone include scheduled prescription medications.    Oxymorphone and noroxycodone are expected metabolites of    oxycodone. Oxymorphone is also available as a scheduled    prescription  medication. ==================================================================== Test                      Result    Flag   Units      Ref Range   Creatinine              137              mg/dL      >=20 ==================================================================== Declared Medications:  The flagging and interpretation on this report are based on the  following declared medications.  Unexpected results may arise from  inaccuracies in the declared medications.  **Note: The testing scope of this panel includes these medications:  Oxycodone  **Note: The testing scope of this panel does not include following  reported medications:  Amlodipine (Exforge)  Naloxone  Sumatriptan  Topical  Valsartan (Exforge) ==================================================================== For clinical consultation, please call [phone] ====================================================================    UDS interpretation: Compliant          Medication Assessment Form: Reviewed. Patient indicates being compliant with therapy Treatment compliance: Compliant Risk Assessment Profile: Aberrant behavior: See prior evaluations. None observed or detected today Comorbid factors increasing risk of overdose: See prior notes. No additional risks detected today Risk of substance use disorder (SUD): Low Opioid Risk Tool (ORT) Total  Score: 1  Interpretation Table:  Score <3 = Low Risk for SUD  Score between 4-7 = Moderate Risk for SUD  Score >8 = High Risk for Opioid Abuse   Risk Mitigation Strategies:  Patient Counseling: Covered Patient-Prescriber Agreement (PPA): Present and active  Notification to other healthcare providers: Done  Pharmacologic Plan: No change in therapy, at this time  Laboratory Chemistry  Inflammation Markers Lab Results  Component Value Date   ESRSEDRATE 11 04/03/2015   CRP 0.7 04/03/2015   Renal Function Lab Results  Component Value Date   BUN 10  01/22/2016   CREATININE 0.88 01/22/2016   GFRAA 113 01/22/2016   GFRNONAA 98 01/22/2016   Hepatic Function Lab Results  Component Value Date   AST 38 01/22/2016   ALT 52 (H) 01/22/2016   ALBUMIN 4.7 01/22/2016   Electrolytes Lab Results  Component Value Date   NA 140 01/22/2016   K 3.8 01/22/2016   CL 101 01/22/2016   CALCIUM 9.3 01/22/2016   MG 2.0 04/03/2015   Pain Modulating Vitamins No results found for: Maralyn Sago [id number], [id number], [id number], 25OHVITD1, 25OHVITD2, 25OHVITD3, VITAMINB12 Coagulation Parameters Lab Results  Component Value Date   PLT 212 01/22/2016   Cardiovascular Lab Results  Component Value Date   HGB 13.9 01/28/2015   HCT 42.2 01/22/2016   Note: Lab results reviewed.  Recent Diagnostic Imaging Review  No results found. Note: Imaging results reviewed.          Meds  The patient has a current medication list which includes the following prescription(s): albuterol, amlodipine-valsartan, doxycycline, ery-tab, fluticasone-salmeterol, metoprolol succinate, naloxone, NONFORMULARY OR COMPOUNDED ITEM, nortriptyline, oxycodone hcl, oxycodone hcl, oxycodone hcl, and sumatriptan.  Current Outpatient Prescriptions on File Prior to Visit  Medication Sig  . albuterol (PROVENTIL HFA;VENTOLIN HFA) 108 (90 Base) MCG/ACT inhaler Inhale 2 puffs into the lungs every 6 (six) hours as needed for wheezing or shortness of breath.  [name] amLODipine-valsartan (EXFORGE) 10-320 MG tablet Take 1 tablet by mouth daily.  . Fluticasone-Salmeterol (ADVAIR DISKUS) 250-50 MCG/DOSE AEPB Inhale 1 puff into the lungs 2 (two) times daily.  . metoprolol succinate (TOPROL-XL) 100 MG 24 hr tablet Take 1 tablet (100 mg total) by mouth daily. Take with or immediately following a meal.  . Naloxone HCl (NARCAN) 4 MG/0.1ML LIQD Place 1 Bottle into the nose once. , then call 911, repeat if needed in other nostril with new bottle.  . nortriptyline (PAMELOR) 25 MG capsule Take 1 capsule (25 mg  total) by mouth at bedtime.  . SUMAtriptan (IMITREX) 100 MG tablet Take 1 tablet earliest onset of headache.  May repeat once after 2 hours if headache persists or recurs.  Do not exceed 2 tablets in 24 hours   No current facility-administered medications on file prior to visit.    ROS  Constitutional: Denies any fever or chills Gastrointestinal: No reported hemesis, hematochezia, vomiting, or acute GI distress Musculoskeletal: Denies any acute onset joint swelling, redness, loss of ROM, or weakness Neurological: No reported episodes of acute onset apraxia, aphasia, dysarthria, agnosia, amnesia, paralysis, loss of coordination, or loss of consciousness  Allergies  Mr. Demetriou is allergic to lidocaine; penicillins; strawberry (diagnostic); aspirin; and novocain [procaine].  [place]  Drug: Mr. Arakawa  reports that he does not use drugs. Alcohol:  reports that he does not drink alcohol. Tobacco:  reports that he has been smoking Cigarettes.  He has a 26.25 pack-year smoking history. He has never used smokeless tobacco. Medical:  has a past medical  history of Allergy; Arthritis; Chronic back pain; Hypertension; and Migraines. Family: family history includes Cancer in his father; Diabetes in his sister; Thrombosis in his sister.  Past Surgical History:  Procedure Laterality Date  . APPENDECTOMY    . BACK SURGERY    . COLONOSCOPY WITH PROPOFOL N/A 02/19/2016   Procedure: COLONOSCOPY WITH PROPOFOL;  Surgeon: Lucilla Lame, MD;  Location: [facility];  Service: Endoscopy;  Laterality: N/A;  . FOOT SURGERY Left    Screws and plates  . KNEE SURGERY Left    X 2  . LEG SURGERY    . POLYPECTOMY N/A 02/19/2016   Procedure: POLYPECTOMY;  Surgeon: Lucilla Lame, MD;  Location: [facility];  Service: Endoscopy;  Laterality: N/A;   Constitutional Exam  General appearance: Well nourished, well developed, and well hydrated. In no apparent acute distress Vitals:   04/16/16 1006  BP: (!)  154/95  Pulse: 77  Resp: 16  Temp: 98.1 F (36.7 C)  TempSrc: Oral  SpO2: 99%  Weight: 220 lb (99.8 kg)  Height: 5' 10"  (1.778 m)   BMI Assessment: Estimated body mass index is 31.57 kg/m as calculated from the following:   Height as of this encounter: 5' 10"  (1.778 m).   Weight as of this encounter: 220 lb (99.8 kg).  BMI interpretation table: BMI level Category Range association with higher incidence of chronic pain  <18 kg/m2 Underweight   18.5-24.9 kg/m2 Ideal body weight   25-29.9 kg/m2 Overweight Increased incidence by 20%  30-34.9 kg/m2 Obese (Class I) Increased incidence by 68%  35-39.9 kg/m2 Severe obesity (Class II) Increased incidence by 136%  >40 kg/m2 Extreme obesity (Class III) Increased incidence by 254%   BMI Readings from Last 4 Encounters:  04/16/16 31.57 kg/m  04/15/16 31.85 kg/m  04/01/16 31.47 kg/m  03/25/16 32.06 kg/m   Wt Readings from Last 4 Encounters:  04/16/16 220 lb (99.8 kg)  04/15/16 222 lb (100.7 kg)  04/01/16 219 lb 4.8 oz (99.5 kg)  03/25/16 223 lb 7 oz (101.4 kg)  Psych/Mental status: Alert, oriented x 3 (person, place, & time)       Eyes: PERLA Respiratory: No evidence of acute respiratory distress  Cervical Spine Exam  Inspection: No masses, redness, or swelling Alignment: Symmetrical Functional ROM: Unrestricted ROM Stability: No instability detected Muscle strength & Tone: Functionally intact Sensory: Unimpaired Palpation: Non-contributory  Upper Extremity (UE) Exam    Side: Right upper extremity  Side: Left upper extremity  Inspection: No masses, redness, swelling, or asymmetry. No contractures  Inspection: No masses, redness, swelling, or asymmetry. No contractures  Functional ROM: Unrestricted ROM          Functional ROM: Unrestricted ROM          Muscle strength & Tone: Functionally intact  Muscle strength & Tone: Functionally intact  Sensory: Unimpaired  Sensory: Unimpaired  Palpation: Euthermic  Palpation: Euthermic   Specialized Test(s): Deferred         Specialized Test(s): Deferred          Thoracic Spine Exam  Inspection: No masses, redness, or swelling Alignment: Symmetrical Functional ROM: Unrestricted ROM Stability: No instability detected Sensory: Unimpaired Muscle strength & Tone: Functionally intact Palpation: Non-contributory  Lumbar Spine Exam  Inspection: No masses, redness, or swelling Alignment: Symmetrical Functional ROM: Unrestricted ROM Stability: No instability detected Muscle strength & Tone: Functionally intact Sensory: Unimpaired Palpation: Non-contributory Provocative Tests: Lumbar Hyperextension and rotation test: evaluation deferred today       Patrick's Maneuver:  evaluation deferred today              Gait & Posture Assessment  Ambulation: Unassisted Gait: Relatively normal for age and body habitus Posture: WNL   Lower Extremity Exam    Side: Right lower extremity  Side: Left lower extremity  Inspection: No masses, redness, swelling, or asymmetry. No contractures  Inspection: No masses, redness, swelling, or asymmetry. No contractures  Functional ROM: Unrestricted ROM          Functional ROM: Unrestricted ROM          Muscle strength & Tone: Functionally intact  Muscle strength & Tone: Functionally intact  Sensory: Unimpaired  Sensory: Unimpaired  Palpation: No palpable anomalies  Palpation: No palpable anomalies   Assessment  Primary Diagnosis & Pertinent Problem List: The encounter diagnosis was Chronic pain syndrome.  Status Diagnosis  Controlled Controlled Controlled 1. Chronic pain syndrome      Plan of Care  Pharmacotherapy (Medications Ordered): Meds ordered this encounter  Medications  . NONFORMULARY OR COMPOUNDED ITEM    Sig: 10% Ketamine/2% Cyclobenzaprine/6% Gabapentin Cream Sig: 1-2 ml to affected area 3-4 times/day. Amount: 240 GM    Dispense:  240 each    Refill:  2  . Oxycodone HCl 10 MG TABS    Sig: Take 1 tablet (10 mg total) by  mouth every 6 (six) hours as needed.    Dispense:  120 tablet    Refill:  0    Do not place this medication, or any other prescription from our practice, on "Automatic Refill". Patient may have prescription filled one day early if pharmacy is closed on scheduled refill date. Do not fill until: 04/29/16 To last until: 05/29/16  . Oxycodone HCl 10 MG TABS    Sig: Take 1 tablet (10 mg total) by mouth every 6 (six) hours as needed.    Dispense:  120 tablet    Refill:  0    Do not place this medication, or any other prescription from our practice, on "Automatic Refill". Patient may have prescription filled one day early if pharmacy is closed on scheduled refill date. Do not fill until: 05/29/16 To last until: 06/28/16  . Oxycodone HCl 10 MG TABS    Sig: Take 1 tablet (10 mg total) by mouth every 6 (six) hours as needed.    Dispense:  120 tablet    Refill:  0    Do not place this medication, or any other prescription from our practice, on "Automatic Refill". Patient may have prescription filled one day early if pharmacy is closed on scheduled refill date. Do not fill until: 06/28/16 To last until: 07/28/16   New Prescriptions   No medications on file   Medications administered today: Mr. Mcgillis had no medications administered during this visit. Lab-work, procedure(s), and/or referral(s): No orders of the defined types were placed in this encounter.  Imaging and/or referral(s): None  Interventional therapies: Planned, scheduled, and/or pending:   None at this time. Claims to have anaphylactic allergy to Lidocaine.   Considering:   None at this time. Claims to have anaphylactic allergy to Lidocaine.   Palliative PRN treatment(s):   None at this time. Claims to have anaphylactic allergy to Lidocaine.   Provider-requested follow-up: Return in about 3 months (around 07/14/2016) for (Nurse Practitioner) Med-Mgmt.  Future Appointments Date Time Provider [place]  05/14/2016  8:30 AM [name], DO [facility] None  06/25/2016 9:00 AM Pieter Partridge, DO [facility] None   Primary  Care Physician: [name], DO Location: [facility] Out[facility] Note by: Kathlen Brunswick. Dossie Arbour, M.D, DABA, DABAPM, DABPM, DABIPP, FIPP Date: 04/16/2016; Time: 3:58 PM  Pain Score Disclaimer: We use the NRS-11 scale. This is a self-reported, subjective measurement of pain severity with only modest accuracy. It is used primarily to identify changes within a particular patient. It must be understood that outpatient pain scales are significantly less accurate that those used for research, where they can be applied under ideal controlled circumstances with minimal exposure to variables. In reality, the score is likely to be a combination of pain intensity and pain affect, where pain affect describes the degree of emotional arousal or changes in action readiness caused by the sensory experience of pain. Factors such as social and work situation, setting, emotional state, anxiety levels, expectation, and prior pain experience may influence pain perception and show large inter-individual differences that may also be affected by time variables.  Patient instructions provided during this appointment: Patient Instructions   Today you were given prescriptions for oxycodone x 3 and non formulary cream. Pain Score  Introduction: The pain score used by this practice is the Verbal Numerical Rating Scale (VNRS-11). This is an 11-point scale. It is for adults and children 10 years or older. There are significant differences in how the pain score is reported, used, and applied. Forget everything you learned in the past and learn this scoring system.  General Information: The scale should reflect your current level of pain. Unless you are specifically asked for the level of your worst pain, or your average pain. If you are asked for one of these two, then it should be understood that it is over  the past 24 hours.  Basic Activities of Daily Living (ADL): Personal hygiene, dressing, eating, transferring, and using restroom.  Instructions: Most patients tend to report their level of pain as a combination of two factors, their physical pain and their psychosocial pain. This last one is also known as "suffering" and it is reflection of how physical pain affects you socially and psychologically. From now on, report them separately. From this point on, when asked to report your pain level, report only your physical pain. Use the following table for reference.  Pain Clinic Pain Levels (0-5/10)  Pain Level Score Description  No Pain 0   Mild pain 1 Nagging, annoying, but does not interfere with basic activities of daily living (ADL). Patients are able to eat, bathe, get dressed, toileting (being able to get on and off the toilet and perform personal hygiene functions), transfer (move in and out of bed or a chair without assistance), and maintain continence (able to control bladder and bowel functions). Blood pressure and heart rate are unaffected. A normal heart rate for a healthy adult ranges from 60 to 100 bpm (beats per minute).   Mild to moderate pain 2 Noticeable and distracting. Impossible to hide from other people. More frequent flare-ups. Still possible to adapt and function close to normal. It can be very annoying and may have occasional stronger flare-ups. With discipline, patients may get used to it and adapt.   Moderate pain 3 Interferes significantly with activities of daily living (ADL). It becomes difficult to feed, bathe, get dressed, get on and off the toilet or to perform personal hygiene functions. Difficult to get in and out of bed or a chair without assistance. Very distracting. With effort, it can be ignored when deeply involved in activities.   Moderately severe pain  4 Impossible to ignore for more than a few minutes. With effort, patients may still be able to manage work or  participate in some social activities. Very difficult to concentrate. Signs of autonomic nervous system discharge are evident: dilated pupils (mydriasis); mild sweating (diaphoresis); sleep interference. Heart rate becomes elevated (>115 bpm). Diastolic blood pressure (lower number) rises above 100 mmHg. Patients find relief in laying down and not moving.   Severe pain 5 Intense and extremely unpleasant. Associated with frowning face and frequent crying. Pain overwhelms the senses.  Ability to do any activity or maintain social relationships becomes significantly limited. Conversation becomes difficult. Pacing back and forth is common, as getting into a comfortable position is nearly impossible. Pain wakes you up from deep sleep. Physical signs will be obvious: pupillary dilation; increased sweating; goosebumps; brisk reflexes; cold, clammy hands and feet; nausea, vomiting or dry heaves; loss of appetite; significant sleep disturbance with inability to fall asleep or to remain asleep. When persistent, significant weight loss is observed due to the complete loss of appetite and sleep deprivation.  Blood pressure and heart rate becomes significantly elevated. Caution: If elevated blood pressure triggers a pounding headache associated with blurred vision, then the patient should immediately seek attention at an urgent or emergency care unit, as these may be signs of an impending stroke.    Emergency Department Pain Levels (6-10/10)  Emergency Room Pain 6 Severely limiting. Requires emergency care and should not be seen or managed at an outpatient pain management facility. Communication becomes difficult and requires great effort. Assistance to reach the emergency department may be required. Facial flushing and profuse sweating along with potentially dangerous increases in heart rate and blood pressure will be evident.   Distressing pain 7 Self-care is very difficult. Assistance is required to transport, or  use restroom. Assistance to reach the emergency department will be required. Tasks requiring coordination, such as bathing and getting dressed become very difficult.   Disabling pain 8 Self-care is no longer possible. At this level, pain is disabling. The individual is unable to do even the most "basic" activities such as walking, eating, bathing, dressing, transferring to a bed, or toileting. Fine motor skills are lost. It is difficult to think clearly.   Incapacitating pain 9 Pain becomes incapacitating. Thought processing is no longer possible. Difficult to remember your own name. Control of movement and coordination are lost.   The worst pain imaginable 10 At this level, most patients pass out from pain. When this level is reached, collapse of the autonomic nervous system occurs, leading to a sudden drop in blood pressure and heart rate. This in turn results in a temporary and dramatic drop in blood flow to the brain, leading to a loss of consciousness. Fainting is one of the body's self defense mechanisms. Passing out puts the brain in a calmed state and causes it to shut down for a while, in order to begin the healing process.    Summary: 1. Refer to this scale when providing [place] with your pain level. 2. Be accurate and careful when reporting your pain level. This will help with your care. 3. Over-reporting your pain level will lead to loss of credibility. 4. Even a level of 1/10 means that there is pain and will be treated at our facility. 5. High, inaccurate reporting will be documented as "Symptom Exaggeration", leading to loss of credibility and suspicions of possible secondary gains such as obtaining more narcotics, or wanting to appear disabled, for fraudulent reasons.  6. Only pain levels of 5 or below will be seen at our facility. 7. Pain levels of 6 and above will be sent to the Emergency Department and the appointment cancelled.

## 2016-04-16 NOTE — Patient Instructions (Addendum)
Today you were given prescriptions for oxycodone x 3 and non formulary cream. Pain Score  Introduction: The pain score used by this practice is the Verbal Numerical Rating Scale (VNRS-11). This is an 11-point scale. It is for adults and children 10 years or older. There are significant differences in how the pain score is reported, used, and applied. Forget everything you learned in the past and learn this scoring system.  General Information: The scale should reflect your current level of pain. Unless you are specifically asked for the level of your worst pain, or your average pain. If you are asked for one of these two, then it should be understood that it is over the past 24 hours.  Basic Activities of Daily Living (ADL): Personal hygiene, dressing, eating, transferring, and using restroom.  Instructions: Most patients tend to report their level of pain as a combination of two factors, their physical pain and their psychosocial pain. This last one is also known as "suffering" and it is reflection of how physical pain affects you socially and psychologically. From now on, report them separately. From this point on, when asked to report your pain level, report only your physical pain. Use the following table for reference.  Pain Clinic Pain Levels (0-5/10)  Pain Level Score Description  No Pain 0   Mild pain 1 Nagging, annoying, but does not interfere with basic activities of daily living (ADL). Patients are able to eat, bathe, get dressed, toileting (being able to get on and off the toilet and perform personal hygiene functions), transfer (move in and out of bed or a chair without assistance), and maintain continence (able to control bladder and bowel functions). Blood pressure and heart rate are unaffected. A normal heart rate for a healthy adult ranges from 60 to 100 bpm (beats per minute).   Mild to moderate pain 2 Noticeable and distracting. Impossible to hide from other people. More frequent  flare-ups. Still possible to adapt and function close to normal. It can be very annoying and may have occasional stronger flare-ups. With discipline, patients may get used to it and adapt.   Moderate pain 3 Interferes significantly with activities of daily living (ADL). It becomes difficult to feed, bathe, get dressed, get on and off the toilet or to perform personal hygiene functions. Difficult to get in and out of bed or a chair without assistance. Very distracting. With effort, it can be ignored when deeply involved in activities.   Moderately severe pain 4 Impossible to ignore for more than a few minutes. With effort, patients may still be able to manage work or participate in some social activities. Very difficult to concentrate. Signs of autonomic nervous system discharge are evident: dilated pupils (mydriasis); mild sweating (diaphoresis); sleep interference. Heart rate becomes elevated (>115 bpm). Diastolic blood pressure (lower number) rises above 100 mmHg. Patients find relief in laying down and not moving.   Severe pain 5 Intense and extremely unpleasant. Associated with frowning face and frequent crying. Pain overwhelms the senses.  Ability to do any activity or maintain social relationships becomes significantly limited. Conversation becomes difficult. Pacing back and forth is common, as getting into a comfortable position is nearly impossible. Pain wakes you up from deep sleep. Physical signs will be obvious: pupillary dilation; increased sweating; goosebumps; brisk reflexes; cold, clammy hands and feet; nausea, vomiting or dry heaves; loss of appetite; significant sleep disturbance with inability to fall asleep or to remain asleep. When persistent, significant weight loss is observed due to  the complete loss of appetite and sleep deprivation.  Blood pressure and heart rate becomes significantly elevated. Caution: If elevated blood pressure triggers a pounding headache associated with blurred  vision, then the patient should immediately seek attention at an urgent or emergency care unit, as these may be signs of an impending stroke.    Emergency Department Pain Levels (6-10/10)  Emergency Room Pain 6 Severely limiting. Requires emergency care and should not be seen or managed at an outpatient pain management facility. Communication becomes difficult and requires great effort. Assistance to reach the emergency department may be required. Facial flushing and profuse sweating along with potentially dangerous increases in heart rate and blood pressure will be evident.   Distressing pain 7 Self-care is very difficult. Assistance is required to transport, or use restroom. Assistance to reach the emergency department will be required. Tasks requiring coordination, such as bathing and getting dressed become very difficult.   Disabling pain 8 Self-care is no longer possible. At this level, pain is disabling. The individual is unable to do even the most "basic" activities such as walking, eating, bathing, dressing, transferring to a bed, or toileting. Fine motor skills are lost. It is difficult to think clearly.   Incapacitating pain 9 Pain becomes incapacitating. Thought processing is no longer possible. Difficult to remember your own name. Control of movement and coordination are lost.   The worst pain imaginable 10 At this level, most patients pass out from pain. When this level is reached, collapse of the autonomic nervous system occurs, leading to a sudden drop in blood pressure and heart rate. This in turn results in a temporary and dramatic drop in blood flow to the brain, leading to a loss of consciousness. Fainting is one of the body's self defense mechanisms. Passing out puts the brain in a calmed state and causes it to shut down for a while, in order to begin the healing process.    Summary: 1. Refer to this scale when providing [place] with your pain level. 2. Be accurate and careful when  reporting your pain level. This will help with your care. 3. Over-reporting your pain level will lead to loss of credibility. 4. Even a level of 1/10 means that there is pain and will be treated at our facility. 5. High, inaccurate reporting will be documented as "Symptom Exaggeration", leading to loss of credibility and suspicions of possible secondary gains such as obtaining more narcotics, or wanting to appear disabled, for fraudulent reasons. 6. Only pain levels of 5 or below will be seen at our facility. 7. Pain levels of 6 and above will be sent to the Emergency Department and the appointment cancelled.

## 2016-05-14 ENCOUNTER — Ambulatory Visit: Payer: Medicare Other | Admitting: Family Medicine

## 2016-05-14 ENCOUNTER — Telehealth: Payer: Self-pay | Admitting: Pain Medicine

## 2016-05-14 NOTE — Telephone Encounter (Signed)
Patient needs statement of medical necessity so he can keep receiving pain cream that has been prescribed for him. Please call patient.

## 2016-05-16 ENCOUNTER — Telehealth: Payer: Self-pay | Admitting: *Deleted

## 2016-05-16 NOTE — Telephone Encounter (Signed)
Spoke with patient, needs this letter to [employer]. Will come here tomorrow to sign a consent to send that info.

## 2016-05-17 NOTE — Telephone Encounter (Signed)
Consent signed, Dr. Adalberto Cole note faxed to the number given.

## 2016-06-24 ENCOUNTER — Ambulatory Visit (INDEPENDENT_AMBULATORY_CARE_PROVIDER_SITE_OTHER): Payer: Medicare Other | Admitting: Family Medicine

## 2016-06-24 ENCOUNTER — Encounter: Payer: Self-pay | Admitting: Family Medicine

## 2016-06-24 VITALS — BP 170/110 | HR 73 | Temp 97.9°F | Ht 69.09 in | Wt 223.1 lb

## 2016-06-24 DIAGNOSIS — Z125 Encounter for screening for malignant neoplasm of prostate: Secondary | ICD-10-CM | POA: Diagnosis not present

## 2016-06-24 DIAGNOSIS — K219 Gastro-esophageal reflux disease without esophagitis: Secondary | ICD-10-CM | POA: Insufficient documentation

## 2016-06-24 DIAGNOSIS — Z23 Encounter for immunization: Secondary | ICD-10-CM | POA: Diagnosis not present

## 2016-06-24 DIAGNOSIS — G894 Chronic pain syndrome: Secondary | ICD-10-CM

## 2016-06-24 DIAGNOSIS — I129 Hypertensive chronic kidney disease with stage 1 through stage 4 chronic kidney disease, or unspecified chronic kidney disease: Secondary | ICD-10-CM

## 2016-06-24 DIAGNOSIS — Z0000 Encounter for general adult medical examination without abnormal findings: Secondary | ICD-10-CM | POA: Diagnosis not present

## 2016-06-24 DIAGNOSIS — Z1159 Encounter for screening for other viral diseases: Secondary | ICD-10-CM | POA: Diagnosis not present

## 2016-06-24 DIAGNOSIS — Z720 Tobacco use: Secondary | ICD-10-CM | POA: Diagnosis not present

## 2016-06-24 DIAGNOSIS — Z13220 Encounter for screening for lipoid disorders: Secondary | ICD-10-CM | POA: Diagnosis not present

## 2016-06-24 DIAGNOSIS — G43009 Migraine without aura, not intractable, without status migrainosus: Secondary | ICD-10-CM

## 2016-06-24 DIAGNOSIS — E876 Hypokalemia: Secondary | ICD-10-CM | POA: Diagnosis not present

## 2016-06-24 DIAGNOSIS — Z114 Encounter for screening for human immunodeficiency virus [HIV]: Secondary | ICD-10-CM

## 2016-06-24 DIAGNOSIS — Z87891 Personal history of nicotine dependence: Secondary | ICD-10-CM | POA: Insufficient documentation

## 2016-06-24 LAB — UA/M W/RFLX CULTURE, ROUTINE
Bilirubin, UA: NEGATIVE
Glucose, UA: NEGATIVE
Ketones, UA: NEGATIVE
Leukocytes, UA: NEGATIVE
Nitrite, UA: NEGATIVE
Specific Gravity, UA: 1.025 (ref 1.005–1.030)
Urobilinogen, Ur: 1 mg/dL (ref 0.2–1.0)
pH, UA: 6.5 (ref 5.0–7.5)

## 2016-06-24 LAB — MICROSCOPIC EXAMINATION: Bacteria, UA: NONE SEEN

## 2016-06-24 LAB — MICROALBUMIN, URINE WAIVED
Creatinine, Urine Waived: 300 mg/dL (ref 10–300)
Microalb, Ur Waived: 80 mg/L — ABNORMAL HIGH (ref 0–19)

## 2016-06-24 MED ORDER — HYDROCHLOROTHIAZIDE 25 MG PO TABS: 25.0000 mg | ORAL_TABLET | Freq: Every day | ORAL | 3 refills | Status: DC

## 2016-06-24 MED ORDER — METOPROLOL SUCCINATE ER 100 MG PO TB24: 100.0000 mg | ORAL_TABLET | Freq: Every day | ORAL | 1 refills | Status: DC

## 2016-06-24 MED ORDER — AMLODIPINE BESYLATE-VALSARTAN 10-320 MG PO TABS: 1.0000 | ORAL_TABLET | Freq: Every day | ORAL | 1 refills | Status: DC

## 2016-06-24 MED ORDER — FLUTICASONE-SALMETEROL 250-50 MCG/DOSE IN AEPB: 1.0000 | INHALATION_SPRAY | Freq: Two times a day (BID) | RESPIRATORY_TRACT | 3 refills | Status: DC

## 2016-06-24 MED ORDER — OMEPRAZOLE 20 MG PO CPDR: 20.0000 mg | DELAYED_RELEASE_CAPSULE | Freq: Every day | ORAL | 3 refills | Status: DC

## 2016-06-24 NOTE — Assessment & Plan Note (Signed)
Not ready to quit, he knows we're here when he's ready. Pneumovax given today.

## 2016-06-24 NOTE — Assessment & Plan Note (Signed)
Follows with neurology- due to see them tomorrow. Continue current regimen. Continue to monitor. Call with any concerns.

## 2016-06-24 NOTE — Progress Notes (Signed)
BP (!) 170/110   Pulse 73   Temp 97.9 F (36.6 C)   Ht 5' 9.09" (1.755 m)   Wt 223 lb 1.6 oz (101.2 kg)   SpO2 98%   BMI 32.86 kg/m    Subjective:    Patient ID: Kristopher [name], male    DOB: 03/31/1960, 56 y.o.   MRN: [id number]  HPI: Kristopher [name] is a 56 y.o. male presenting on 06/24/2016 for comprehensive medical examination. Current medical complaints include:  HYPERTENSION Hypertension status: uncontrolled  Satisfied with current treatment? no Duration of hypertension: chronic BP monitoring frequency:  a few times a month BP medication side effects:  no Medication compliance: excellent compliance Previous BP meds: metoprolol, amlodipine, valsartan- all max doses Aspirin: no Recurrent headaches: yes- just in the last week Visual changes: yes Palpitations: no Dyspnea: no Chest pain: no Lower extremity edema: no Dizzy/lightheaded: no  He currently lives with: Significant other Interim Problems from his last visit: no  Functional Status Survey: Is the patient deaf or have difficulty hearing?: No Does the patient have difficulty seeing, even when wearing glasses/contacts?: No Does the patient have difficulty concentrating, remembering, or making decisions?: No Does the patient have difficulty walking or climbing stairs?: Yes (at times difficulty climbing stairs) Does the patient have difficulty dressing or bathing?: No Does the patient have difficulty doing errands alone such as visiting a doctor's office or shopping?: No  FALL RISK: Fall Risk  06/24/2016 04/16/2016 03/25/2016 03/01/2016 01/11/2016  Falls in the past year? Yes No Yes Yes Yes  Number falls in past yr: 2 or more - 1 - 2 or more  Injury with Fall? No - Yes - No  Risk Factor Category  - - - - High Fall Risk  Risk for fall due to : - - - - History of fall(s);Impaired balance/gait  Risk for fall due to (comments): - - - - -  Follow up - - - - Falls evaluation completed    Depression Screen Depression  screen [facility] 2/9 06/24/2016 04/16/2016 01/11/2016 10/11/2015 07/24/2015  Decreased Interest 0 0 0 0 0  Down, Depressed, Hopeless 0 0 0 0 0  PHQ - 2 Score 0 0 0 0 0    Advanced Directives See Appropriate Area of Chart  Past Medical History:  Past Medical History:  Diagnosis Date  . Allergy   . Arthritis    left foot  . Chronic back pain    Four rods in back  . Hypertension   . Migraines    daily    Surgical History:  Past Surgical History:  Procedure Laterality Date  . APPENDECTOMY    . BACK SURGERY    . COLONOSCOPY WITH PROPOFOL N/A 02/19/2016   Procedure: COLONOSCOPY WITH PROPOFOL;  Surgeon: Lucilla Lame, MD;  Location: [place];  Service: Endoscopy;  Laterality: N/A;  . FOOT SURGERY Left    Screws and plates  . KNEE SURGERY Left    X 2  . LEG SURGERY    . POLYPECTOMY N/A 02/19/2016   Procedure: POLYPECTOMY;  Surgeon: Lucilla Lame, MD;  Location: [place];  Service: Endoscopy;  Laterality: N/A;    Medications:  Current Outpatient Prescriptions on File Prior to Visit  Medication Sig  . albuterol (PROVENTIL HFA;VENTOLIN HFA) 108 (90 Base) MCG/ACT inhaler Inhale 2 puffs into the lungs every 6 (six) hours as needed for wheezing or shortness of breath.  . ERY-TAB 333 MG EC tablet 333 mg as needed.   . Naloxone  HCl (NARCAN) 4 MG/0.1ML LIQD Place 1 Bottle into the nose once. , then call 911, repeat if needed in other nostril with new bottle.  . NONFORMULARY OR COMPOUNDED ITEM 10% Ketamine/2% Cyclobenzaprine/6% Gabapentin Cream Sig: 1-2 ml to affected area 3-4 times/day. Amount: 240 GM  . nortriptyline (PAMELOR) 25 MG capsule Take 1 capsule (25 mg total) by mouth at bedtime.  . Oxycodone HCl 10 MG TABS Take 1 tablet (10 mg total) by mouth every 6 (six) hours as needed.  . Oxycodone HCl 10 MG TABS Take 1 tablet (10 mg total) by mouth every 6 (six) hours as needed.  Derrill Memo ON 06/28/2016] Oxycodone HCl 10 MG TABS Take 1 tablet (10 mg total) by mouth every 6 (six)  hours as needed.  . SUMAtriptan (IMITREX) 100 MG tablet Take 1 tablet earliest onset of headache.  May repeat once after 2 hours if headache persists or recurs.  Do not exceed 2 tablets in 24 hours   No current facility-administered medications on file prior to visit.     Allergies:  Allergies  Allergen Reactions  . Lidocaine Anaphylaxis  . Penicillins Anaphylaxis  . Strawberry (Diagnostic) Anaphylaxis  . Aspirin     Feels like his throat is closing up  . Novocain [Procaine] Swelling    Social History:  Social History   Social History  . Marital status: Significant Other    Spouse name: N/A  . Number of children: N/A  . Years of education: N/A   Occupational History  . Not on file.   Social History Main Topics  . Smoking status: Current Some Day Smoker    Packs/day: 0.75    Years: 35.00    Types: Cigarettes  . Smokeless tobacco: Never Used  . Alcohol use No  . Drug use: No  . Sexual activity: Yes   Other Topics Concern  . Not on file   Social History Narrative  . No narrative on file   History  Smoking Status  . Current Some Day Smoker  . Packs/day: 0.75  . Years: 35.00  . Types: Cigarettes  Smokeless Tobacco  . Never Used   History  Alcohol Use No    Family History:  Family History  Problem Relation Age of Onset  . Cancer Father   . Diabetes Sister   . Thrombosis Sister     Past medical history, surgical history, medications, allergies, family history and social history reviewed with patient today and changes made to appropriate areas of the chart.   Review of Systems  Constitutional: Negative.   HENT: Negative.   Eyes: Positive for blurred vision. Negative for double vision, photophobia, pain, discharge and redness.  Respiratory: Negative.   Cardiovascular: Negative.   Gastrointestinal: Positive for heartburn (Has been taking nexium and that has been helping). Negative for abdominal pain, blood in stool, constipation, diarrhea, melena,  nausea and vomiting.  Genitourinary: Negative.   Musculoskeletal: Positive for back pain, joint pain and myalgias. Negative for falls and neck pain.  Skin: Negative.   Neurological: Positive for tingling and headaches. Negative for dizziness, tremors, sensory change, speech change, focal weakness, seizures and loss of consciousness.  Endo/Heme/Allergies: Positive for environmental allergies (has been taking benadryl- which seems to be helping, doesn't make him sleepy). Negative for polydipsia. Does not bruise/bleed easily.  Psychiatric/Behavioral: Negative.        Has been more grumpy more     All other ROS negative except what is listed above and in the HPI.  Objective:    BP (!) 170/110   Pulse 73   Temp 97.9 F (36.6 C)   Ht 5' 9.09" (1.755 m)   Wt 223 lb 1.6 oz (101.2 kg)   SpO2 98%   BMI 32.86 kg/m   Wt Readings from Last 3 Encounters:  06/24/16 223 lb 1.6 oz (101.2 kg)  04/16/16 220 lb (99.8 kg)  04/15/16 222 lb (100.7 kg)     Hearing Screening   '125Hz'$  '250Hz'$  '500Hz'$  '1000Hz'$  '2000Hz'$  '3000Hz'$  '4000Hz'$  '6000Hz'$  '8000Hz'$   Right ear:   '25 25 25  '$ Fail    Left ear:   '25 25 25  '$ Fail      Visual Acuity Screening   Right eye Left eye Both eyes  Without correction: '20/25 20/40 20/25 '$  With correction:       Physical Exam  Constitutional: He is oriented to person, place, and time. He appears well-developed and well-nourished. No distress.  HENT:  Head: Normocephalic and atraumatic.  Right Ear: Hearing, tympanic membrane, external ear and ear canal normal.  Left Ear: Hearing, tympanic membrane, external ear and ear canal normal.  Nose: Nose normal.  Mouth/Throat: Uvula is midline, oropharynx is clear and moist and mucous membranes are normal. No oropharyngeal exudate.  Eyes: Conjunctivae, EOM and lids are normal. Pupils are equal, round, and reactive to light. Right eye exhibits no discharge. Left eye exhibits no discharge. No scleral icterus.  Neck: Normal range of motion. Neck  supple. No JVD present. No tracheal deviation present. No thyromegaly present.  Cardiovascular: Normal rate, regular rhythm, normal heart sounds and intact distal pulses.  Exam reveals no gallop and no friction rub.   No murmur heard. Pulmonary/Chest: Effort normal and breath sounds normal. No stridor. No respiratory distress. He has no wheezes. He has no rales. He exhibits no tenderness.  Abdominal: Soft. Bowel sounds are normal. He exhibits no distension and no mass. There is no tenderness. There is no rebound and no guarding. Hernia confirmed negative in the right inguinal area and confirmed negative in the left inguinal area.  Genitourinary: Testes normal. Rectal exam shows external hemorrhoid, tenderness and anal tone abnormal (heightened tone). Right testis shows no mass, no swelling and no tenderness. Right testis is descended. Cremasteric reflex is not absent on the right side. Left testis shows no mass, no swelling and no tenderness. Left testis is descended. Cremasteric reflex is not absent on the left side. Circumcised. No phimosis, paraphimosis, hypospadias, penile erythema or penile tenderness. No discharge found.  Genitourinary Comments: Penis bends to the L significantly Prostate unable to be examined due to heightened rectal tone  Musculoskeletal: Normal range of motion. He exhibits tenderness. He exhibits no edema or deformity.  Lymphadenopathy:    He has no cervical adenopathy.  Neurological: He is alert and oriented to person, place, and time. He has normal reflexes. He displays normal reflexes. No cranial nerve deficit. He exhibits normal muscle tone. Coordination normal.  Skin: Skin is warm, dry and intact. No rash noted. He is not diaphoretic. No erythema. No pallor.  Psychiatric: He has a normal mood and affect. His speech is normal and behavior is normal. Judgment and thought content normal. Cognition and memory are normal.  Nursing note and vitals reviewed.   6CIT Screen  06/24/2016  What Year? 0 points  What month? 0 points  What time? 0 points  Count back from 20 0 points  Months in reverse 4 points  Repeat phrase 4 points  Total Score 8  Results for orders placed or performed in visit on 01/22/16  Microscopic Examination  Result Value Ref Range   WBC, UA None seen 0 - 5 /hpf   RBC, UA 3-10 (A) 0 - 2 /hpf   Epithelial Cells (non renal) 0-10 0 - 10 /hpf  CBC with Differential/Platelet  Result Value Ref Range   WBC 5.5 3.4 - 10.8 x10E3/uL   RBC 4.92 4.14 - 5.80 x10E6/uL   Hemoglobin 14.7 12.6 - 17.7 g/dL   Hematocrit 42.2 37.5 - 51.0 %   MCV 86 79 - 97 fL   MCH 29.9 26.6 - 33.0 pg   MCHC 34.8 31.5 - 35.7 g/dL   RDW 14.2 12.3 - 15.4 %   Platelets 212 150 - 379 x10E3/uL   Neutrophils 50 Not Estab. %   Lymphs 40 Not Estab. %   Monocytes 7 Not Estab. %   Eos 2 Not Estab. %   Basos 1 Not Estab. %   Neutrophils Absolute 2.8 1.4 - 7.0 x10E3/uL   Lymphocytes Absolute 2.2 0.7 - 3.1 x10E3/uL   Monocytes Absolute 0.4 0.1 - 0.9 x10E3/uL   EOS (ABSOLUTE) 0.1 0.0 - 0.4 x10E3/uL   Basophils Absolute 0.0 0.0 - 0.2 x10E3/uL   Immature Granulocytes 0 Not Estab. %   Immature Grans (Abs) 0.0 0.0 - 0.1 x10E3/uL  Comprehensive metabolic panel  Result Value Ref Range   Glucose 133 (H) 65 - 99 mg/dL   BUN 10 6 - 24 mg/dL   Creatinine, Ser 0.88 0.76 - 1.27 mg/dL   GFR calc non Af Amer 98 >59 mL/min/1.73   GFR calc Af Amer 113 >59 mL/min/1.73   BUN/Creatinine Ratio 11 9 - 20   Sodium 140 134 - 144 mmol/L   Potassium 3.8 3.5 - 5.2 mmol/L   Chloride 101 96 - 106 mmol/L   CO2 23 18 - 29 mmol/L   Calcium 9.3 8.7 - 10.2 mg/dL   Total Protein 7.0 6.0 - 8.5 g/dL   Albumin 4.7 3.5 - 5.5 g/dL   Globulin, Total 2.3 1.5 - 4.5 g/dL   Albumin/Globulin Ratio 2.0 1.2 - 2.2   Bilirubin Total 0.4 0.0 - 1.2 mg/dL   Alkaline Phosphatase 104 39 - 117 IU/L   AST 38 0 - 40 IU/L   ALT 52 (H) 0 - 44 IU/L  Lipid Panel w/o Chol/HDL Ratio  Result Value Ref Range    Cholesterol, Total 184 100 - 199 mg/dL   Triglycerides 217 (H) 0 - 149 mg/dL   HDL 32 (L) >39 mg/dL   VLDL Cholesterol Cal 43 (H) 5 - 40 mg/dL   LDL Calculated 109 (H) 0 - 99 mg/dL  Microalbumin, Urine Waived  Result Value Ref Range   Microalb, Ur Waived 30 (H) 0 - 19 mg/L   Creatinine, Urine Waived 200 10 - 300 mg/dL   Microalb/Creat Ratio <30 <30 mg/g  PSA  Result Value Ref Range   Prostate Specific Ag, Serum 0.7 0.0 - 4.0 ng/mL  TSH  Result Value Ref Range   TSH 0.617 0.450 - 4.500 uIU/mL  UA/M w/rflx Culture, Routine  Result Value Ref Range   Specific Gravity, UA 1.020 1.005 - 1.030   pH, UA 7.0 5.0 - 7.5   Color, UA Yellow Yellow   Appearance Ur Clear Clear   Leukocytes, UA Negative Negative   Protein, UA Negative Negative/Trace   Glucose, UA Negative Negative   Ketones, UA Negative Negative   RBC, UA 3+ (A) Negative   Bilirubin,  UA Negative Negative   Urobilinogen, Ur 1.0 0.2 - 1.0 mg/dL   Nitrite, UA Negative Negative   Microscopic Examination See below:       Assessment & Plan:   Problem List Items Addressed This Visit      Cardiovascular and Mediastinum   Migraine    Follows with neurology- due to see them tomorrow. Continue current regimen. Continue to monitor. Call with any concerns.       Relevant Medications   metoprolol succinate (TOPROL-XL) 100 MG 24 hr tablet   amLODipine-valsartan (EXFORGE) 10-320 MG tablet   hydrochlorothiazide (HYDRODIURIL) 25 MG tablet   Other Relevant Orders   CBC with Differential/Platelet   Comprehensive metabolic panel   TSH   UA/M w/rflx Culture, Routine     Digestive   GERD (gastroesophageal reflux disease)   Relevant Medications   omeprazole (PRILOSEC) 20 MG capsule     Genitourinary   Benign hypertensive renal disease    Not under good control. Will add HCTZ and monitor very closely for hypokalemia- If drops, consider hydralazine.       Relevant Orders   CBC with Differential/Platelet   Comprehensive  metabolic panel   Microalbumin, Urine Waived   TSH   UA/M w/rflx Culture, Routine     Other   Chronic pain syndrome (Chronic)    Follows with pain management- due to see them this week. Continue current regimen. Continue to monitor. Call with any concerns.       Relevant Orders   CBC with Differential/Platelet   Comprehensive metabolic panel   UA/M w/rflx Culture, Routine   Hypokalemia    Rechecking levels today. Await results.       Relevant Orders   CBC with Differential/Platelet   Comprehensive metabolic panel   UA/M w/rflx Culture, Routine   Tobacco abuse    Not ready to quit, he knows we're here when he's ready. Pneumovax given today.      Relevant Orders   CBC with Differential/Platelet   Comprehensive metabolic panel   UA/M w/rflx Culture, Routine   Pneumococcal polysaccharide vaccine 23-valent greater than or equal to 2yo subcutaneous/IM (Completed)    Other Visit Diagnoses    Medicare annual wellness visit, subsequent    -  Primary   Preventative care discussed as below. Continue diet and exercise.    Screening for prostate cancer       Lab drawn today. Await results.    Relevant Orders   PSA   Screening for cholesterol level       Lab drawn today. Await results.    Relevant Orders   Lipid Panel w/o Chol/HDL Ratio   Encounter for hepatitis C screening test for low risk patient       Lab drawn today. Await results.    Relevant Orders   Hepatitis C Antibody   Screening for HIV without presence of risk factors       Lab drawn today. Await results.    Relevant Orders   HIV antibody   Immunization due       Pneumovax given today.   Relevant Orders   Pneumococcal polysaccharide vaccine 23-valent greater than or equal to 2yo subcutaneous/IM (Completed)       Preventative Services:  Health Risk Assessment and Personalized Prevention Plan: Done today Bone Mass Measurements: N/A CVD Screening: Done today Colon Cancer Screening: Up to date Depression  Screening: Done today Diabetes Screening: Done today Glaucoma Screening: See your eye doctor Hepatitis B vaccine: N/A Hepatitis C screening:  Done today HIV Screening: Done today Flu Vaccine: Due in October Lung cancer Screening: N/A Obesity Screening: Done today Pneumonia Vaccines (2): #1 given today STI Screening: N/A PSA screening: Done today  Discussed aspirin prophylaxis for myocardial infarction prevention and decision was it was not indicated  LABORATORY TESTING:  Health maintenance labs ordered today as discussed above.   The natural history of prostate cancer and ongoing controversy regarding screening and potential treatment outcomes of prostate cancer has been discussed with the patient. The meaning of a false positive PSA and a false negative PSA has been discussed. He indicates understanding of the limitations of this screening test and wishes to proceed with screening PSA testing.   IMMUNIZATIONS:   - Tdap: Tetanus vaccination status reviewed: last tetanus booster within 10 years. - Influenza: Postponed to flu season - Pneumovax: Administered today - Prevnar: Not applicable - Zostavax vaccine: Will check with his insurance  SCREENING: - Colonoscopy: Up to date  Discussed with patient purpose of the colonoscopy is to detect colon cancer at curable precancerous or early stages    PATIENT COUNSELING:    Sexuality: Discussed sexually transmitted diseases, partner selection, use of condoms, avoidance of unintended pregnancy  and contraceptive alternatives.   Advised to avoid cigarette smoking.  I discussed with the patient that most people either abstain from alcohol or drink within safe limits (<=14/week and <=4 drinks/occasion for males, <=7/weeks and <= 3 drinks/occasion for females) and that the risk for alcohol disorders and other health effects rises proportionally with the number of drinks per week and how often a drinker exceeds daily limits.  Discussed  cessation/primary prevention of drug use and availability of treatment for abuse.   Diet: Encouraged to adjust caloric intake to maintain  or achieve ideal body weight, to reduce intake of dietary saturated fat and total fat, to limit sodium intake by avoiding high sodium foods and not adding table salt, and to maintain adequate dietary potassium and calcium preferably from fresh fruits, vegetables, and low-fat dairy products.    stressed the importance of regular exercise  Injury prevention: Discussed safety belts, safety helmets, smoke detector, smoking near bedding or upholstery.   Dental health: Discussed importance of regular tooth brushing, flossing, and dental visits.   Follow up plan: NEXT PREVENTATIVE PHYSICAL DUE IN 1 YEAR. Return 2-3 weeks , for BP follow up.

## 2016-06-24 NOTE — Assessment & Plan Note (Signed)
Follows with pain management- due to see them this week. Continue current regimen. Continue to monitor. Call with any concerns.

## 2016-06-24 NOTE — Assessment & Plan Note (Signed)
Not under good control. Will add HCTZ and monitor very closely for hypokalemia- If drops, consider hydralazine.

## 2016-06-24 NOTE — Patient Instructions (Addendum)
Preventative Services:  Health Risk Assessment and Personalized Prevention Plan: Done today Bone Mass Measurements: N/A CVD Screening: Done today Colon Cancer Screening: Up to date Depression Screening: Done today Diabetes Screening: Done today Glaucoma Screening: See your eye doctor Hepatitis B vaccine: N/A Hepatitis C screening: Done today HIV Screening: Done today Flu Vaccine: Due in October Lung cancer Screening: N/A Obesity Screening: Done today Pneumonia Vaccines (2): #1 given today STI Screening: N/A PSA screening: Done today   Health Maintenance, Male A healthy lifestyle and preventive care is important for your health and wellness. Ask your health care provider about what schedule of regular examinations is right for you. What should I know about weight and diet?  Eat a Healthy Diet  Eat plenty of vegetables, fruits, whole grains, low-fat dairy products, and lean protein.  Do not eat a lot of foods high in solid fats, added sugars, or salt. Maintain a Healthy Weight  Regular exercise can help you achieve or maintain a healthy weight. You should:  Do at least 150 minutes of exercise each week. The exercise should increase your heart rate and make you sweat (moderate-intensity exercise).  Do strength-training exercises at least twice a week. Watch Your Levels of Cholesterol and Blood Lipids  Have your blood tested for lipids and cholesterol every 5 years starting at 55 years of age. If you are at high risk for heart disease, you should start having your blood tested when you are 55 years old. You may need to have your cholesterol levels checked more often if:  Your lipid or cholesterol levels are high.  You are older than 55 years of age.  You are at high risk for heart disease. What should I know about cancer screening? Many types of cancers can be detected early and may often be prevented. Lung Cancer  You should be screened every year for lung cancer if:  You  are a current smoker who has smoked for at least 30 years.  You are a former smoker who has quit within the past 15 years.  Talk to your health care provider about your screening options, when you should start screening, and how often you should be screened. Colorectal Cancer  Routine colorectal cancer screening usually begins at 55 years of age and should be repeated every 5-10 years until you are 55 years old. You may need to be screened more often if early forms of precancerous polyps or small growths are found. Your health care provider may recommend screening at an earlier age if you have risk factors for colon cancer.  Your health care provider may recommend using home test kits to check for hidden blood in the stool.  A small camera at the end of a tube can be used to examine your colon (sigmoidoscopy or colonoscopy). This checks for the earliest forms of colorectal cancer. Prostate and Testicular Cancer  Depending on your age and overall health, your health care provider may do certain tests to screen for prostate and testicular cancer.  Talk to your health care provider about any symptoms or concerns you have about testicular or prostate cancer. Skin Cancer  Check your skin from head to toe regularly.  Tell your health care provider about any new moles or changes in moles, especially if:  There is a change in a mole's size, shape, or color.  You have a mole that is larger than a pencil eraser.  Always use sunscreen. Apply sunscreen liberally and repeat throughout the day.  Protect  yourself by wearing long sleeves, pants, a wide-brimmed hat, and sunglasses when outside. What should I know about heart disease, diabetes, and high blood pressure?  If you are 47-33 years of age, have your blood pressure checked every 3-5 years. If you are 47 years of age or older, have your blood pressure checked every year. You should have your blood pressure measured twice-once when you are at a  hospital or clinic, and once when you are not at a hospital or clinic. Record the average of the two measurements. To check your blood pressure when you are not at a hospital or clinic, you can use:  An automated blood pressure machine at a pharmacy.  A home blood pressure monitor.  Talk to your health care provider about your target blood pressure.  If you are between 18-1 years old, ask your health care provider if you should take aspirin to prevent heart disease.  Have regular diabetes screenings by checking your fasting blood sugar level.  If you are at a normal weight and have a low risk for diabetes, have this test once every three years after the age of 73.  If you are overweight and have a high risk for diabetes, consider being tested at a younger age or more often.  A one-time screening for abdominal aortic aneurysm (AAA) by ultrasound is recommended for men aged 71-75 years who are current or former smokers. What should I know about preventing infection? Hepatitis B  If you have a higher risk for hepatitis B, you should be screened for this virus. Talk with your health care provider to find out if you are at risk for hepatitis B infection. Hepatitis C  Blood testing is recommended for:  Everyone born from [birth year] through 1964.  Anyone with known risk factors for hepatitis C. Sexually Transmitted Diseases (STDs)  You should be screened each year for STDs including gonorrhea and chlamydia if:  You are sexually active and are younger than 55 years of age.  You are older than 55 years of age and your health care provider tells you that you are at risk for this type of infection.  Your sexual activity has changed since you were last screened and you are at an increased risk for chlamydia or gonorrhea. Ask your health care provider if you are at risk.  Talk with your health care provider about whether you are at high risk of being infected with HIV. Your health care provider may  recommend a prescription medicine to help prevent HIV infection. What else can I do?  Schedule regular health, dental, and eye exams.  Stay current with your vaccines (immunizations).  Do not use any tobacco products, such as cigarettes, chewing tobacco, and e-cigarettes. If you need help quitting, ask your health care provider.  Limit alcohol intake to no more than 2 drinks per day. One drink equals 12 ounces of beer, 5 ounces of wine, or 1 ounces of hard liquor.  Do not use street drugs.  Do not share needles.  Ask your health care provider for help if you need support or information about quitting drugs.  Tell your health care provider if you often feel depressed.  Tell your health care provider if you have ever been abused or do not feel safe at home. This information is not intended to replace advice given to you by your health care provider. Make sure you discuss any questions you have with your health care provider. Document Released: 08/17/2007 Document Revised: 10/18/2015  Document Reviewed: 11/22/2014 Elsevier Interactive Patient Education  2017 [employer] Pneumococcal Polysaccharide Vaccine: What You Need to Know 1. Why get vaccinated? Vaccination can protect older adults (and some children and younger adults) from pneumococcal disease. Pneumococcal disease is caused by bacteria that can spread from person to person through close contact. It can cause ear infections, and it can also lead to more serious infections of the:  Lungs (pneumonia),  Blood (bacteremia), and  Covering of the brain and spinal cord (meningitis). Meningitis can cause deafness and brain damage, and it can be fatal. Anyone can get pneumococcal disease, but children under 62 years of age, people with certain medical conditions, adults over 49 years of age, and cigarette smokers are at the highest risk. About 18,000 older adults die each year from pneumococcal disease in the [place]. Treatment  of pneumococcal infections with penicillin and other drugs used to be more effective. But some strains of the disease have become resistant to these drugs. This makes prevention of the disease, through vaccination, even more important. 2. Pneumococcal polysaccharide vaccine (PPSV23) Pneumococcal polysaccharide vaccine (PPSV23) protects against 23 types of pneumococcal bacteria. It will not prevent all pneumococcal disease. PPSV23 is recommended for:  All adults 82 years of age and older,  Anyone 2 through 55 years of age with certain long-term health problems,  Anyone 2 through 56 years of age with a weakened immune system,  Adults 24 through 55 years of age who smoke cigarettes or have asthma. Most people need only one dose of PPSV. A second dose is recommended for certain high-risk groups. People 109 and older should get a dose even if they have gotten one or more doses of the vaccine before they turned 65. Your healthcare provider can give you more information about these recommendations. Most healthy adults develop protection within 2 to 3 weeks of getting the shot. 3. Some people should not get this vaccine  Anyone who has had a life-threatening allergic reaction to PPSV should not get another dose.  Anyone who has a severe allergy to any component of PPSV should not receive it. Tell your provider if you have any severe allergies.  Anyone who is moderately or severely ill when the shot is scheduled may be asked to wait until they recover before getting the vaccine. Someone with a mild illness can usually be vaccinated.  Children less than 58 years of age should not receive this vaccine.  There is no evidence that PPSV is harmful to either a pregnant woman or to her fetus. However, as a precaution, women who need the vaccine should be vaccinated before becoming pregnant, if possible. 4. Risks of a vaccine reaction With any medicine, including vaccines, there is a chance of side effects.  These are usually mild and go away on their own, but serious reactions are also possible. About half of people who get PPSV have mild side effects, such as redness or pain where the shot is given, which go away within about two days. Less than 1 out of 100 people develop a fever, muscle aches, or more severe local reactions. Problems that could happen after any vaccine:   People sometimes faint after a medical procedure, including vaccination. Sitting or lying down for about 15 minutes can help prevent fainting, and injuries caused by a fall. Tell your doctor if you feel dizzy, or have vision changes or ringing in the ears.  Some people get severe pain in the shoulder and have difficulty moving the arm  where a shot was given. This happens very rarely.  Any medication can cause a severe allergic reaction. Such reactions from a vaccine are very rare, estimated at about 1 in a million doses, and would happen within a few minutes to a few hours after the vaccination. As with any medicine, there is a very remote chance of a vaccine causing a serious injury or death. The safety of vaccines is always being monitored. For more information, visit: http://www.aguilar.org/ 5. What if there is a serious reaction? What should I look for?  Look for anything that concerns you, such as signs of a severe allergic reaction, very high fever, or unusual behavior. Signs of a severe allergic reaction can include hives, swelling of the face and throat, difficulty breathing, a fast heartbeat, dizziness, and weakness. These would usually start a few minutes to a few hours after the vaccination. What should I do?  If you think it is a severe allergic reaction or other emergency that can't wait, call 9-1-1 or get to the nearest hospital. Otherwise, call your doctor. Afterward, the reaction should be reported to the Vaccine Adverse Event Reporting System (VAERS). Your doctor might file this report, or you can do it  yourself through the VAERS web site at www.vaers.SamedayNews.es, or by calling 856-699-7232. VAERS does not give medical advice.  6. How can I learn more?  Ask your doctor. He or she can give you the vaccine package insert or suggest other sources of information.  Call your local or state health department.  Contact the Centers for Disease Control and Prevention (CDC):  Call 331-128-9220 (1-800-CDC-INFO) or  Visit CDC's website at http://hunter.com/ CDC Pneumococcal Polysaccharide Vaccine VIS (06/25/13) This information is not intended to replace advice given to you by your health care provider. Make sure you discuss any questions you have with your health care provider. Document Released: 12/16/2005 Document Revised: 11/09/2015 Document Reviewed: 11/09/2015 Elsevier Interactive Patient Education  2017 [employer].

## 2016-06-24 NOTE — Assessment & Plan Note (Signed)
Rechecking levels today. Await results.  

## 2016-06-25 ENCOUNTER — Ambulatory Visit: Payer: Medicare Other | Admitting: Neurology

## 2016-06-25 ENCOUNTER — Encounter: Payer: Self-pay | Admitting: Family Medicine

## 2016-06-25 LAB — CBC WITH DIFFERENTIAL/PLATELET
Basophils Absolute: 0 10*3/uL (ref 0.0–0.2)
Basos: 1 %
EOS (ABSOLUTE): 0.1 10*3/uL (ref 0.0–0.4)
Eos: 2 %
Hematocrit: 43.3 % (ref 37.5–51.0)
Hemoglobin: 14.4 g/dL (ref 13.0–17.7)
Immature Grans (Abs): 0 10*3/uL (ref 0.0–0.1)
Immature Granulocytes: 0 %
Lymphocytes Absolute: 2.1 10*3/uL (ref 0.7–3.1)
Lymphs: 39 %
MCH: 29 pg (ref 26.6–33.0)
MCHC: 33.3 g/dL (ref 31.5–35.7)
MCV: 87 fL (ref 79–97)
Monocytes Absolute: 0.4 10*3/uL (ref 0.1–0.9)
Monocytes: 7 %
Neutrophils Absolute: 2.8 10*3/uL (ref 1.4–7.0)
Neutrophils: 51 %
Platelets: 211 10*3/uL (ref 150–379)
RBC: 4.96 x10E6/uL (ref 4.14–5.80)
RDW: 14.3 % (ref 12.3–15.4)
WBC: 5.4 10*3/uL (ref 3.4–10.8)

## 2016-06-25 LAB — COMPREHENSIVE METABOLIC PANEL
ALT: 51 IU/L — ABNORMAL HIGH (ref 0–44)
AST: 31 IU/L (ref 0–40)
Albumin/Globulin Ratio: 1.6 (ref 1.2–2.2)
Albumin: 4.2 g/dL (ref 3.5–5.5)
Alkaline Phosphatase: 113 IU/L (ref 39–117)
BUN/Creatinine Ratio: 14 (ref 9–20)
BUN: 12 mg/dL (ref 6–24)
Bilirubin Total: 0.4 mg/dL (ref 0.0–1.2)
CO2: 25 mmol/L (ref 18–29)
Calcium: 9.1 mg/dL (ref 8.7–10.2)
Chloride: 101 mmol/L (ref 96–106)
Creatinine, Ser: 0.88 mg/dL (ref 0.76–1.27)
GFR calc Af Amer: 113 mL/min/{1.73_m2} (ref >59)
GFR calc non Af Amer: 97 mL/min/{1.73_m2} (ref >59)
Globulin, Total: 2.7 g/dL (ref 1.5–4.5)
Glucose: 124 mg/dL — ABNORMAL HIGH (ref 65–99)
Potassium: 4 mmol/L (ref 3.5–5.2)
Sodium: 139 mmol/L (ref 134–144)
Total Protein: 6.9 g/dL (ref 6.0–8.5)

## 2016-06-25 LAB — PSA: Prostate Specific Ag, Serum: 0.7 ng/mL (ref 0.0–4.0)

## 2016-06-25 LAB — TSH: TSH: 0.904 u[IU]/mL (ref 0.450–4.500)

## 2016-06-25 LAB — HEPATITIS C ANTIBODY: Hep C Virus Ab: <0.1 s/co ratio (ref 0.0–0.9)

## 2016-06-25 LAB — LIPID PANEL W/O CHOL/HDL RATIO
Cholesterol, Total: 178 mg/dL (ref 100–199)
HDL: 32 mg/dL — ABNORMAL LOW (ref >39)
LDL Calculated: 108 mg/dL — ABNORMAL HIGH (ref 0–99)
Triglycerides: 190 mg/dL — ABNORMAL HIGH (ref 0–149)
VLDL Cholesterol Cal: 38 mg/dL (ref 5–40)

## 2016-06-25 LAB — HIV ANTIBODY (ROUTINE TESTING W REFLEX): HIV Screen 4th Generation wRfx: NONREACTIVE

## 2016-07-08 ENCOUNTER — Ambulatory Visit (INDEPENDENT_AMBULATORY_CARE_PROVIDER_SITE_OTHER): Payer: Medicare Other | Admitting: Family Medicine

## 2016-07-08 ENCOUNTER — Encounter: Payer: Self-pay | Admitting: Family Medicine

## 2016-07-08 VITALS — BP 134/86 | HR 84 | Temp 98.3°F | Wt 223.6 lb

## 2016-07-08 DIAGNOSIS — E876 Hypokalemia: Secondary | ICD-10-CM

## 2016-07-08 DIAGNOSIS — I129 Hypertensive chronic kidney disease with stage 1 through stage 4 chronic kidney disease, or unspecified chronic kidney disease: Secondary | ICD-10-CM

## 2016-07-08 NOTE — Progress Notes (Signed)
BP 134/86 (BP Location: Left Arm, Patient Position: Sitting, Cuff Size: Large)   Pulse 84   Temp 98.3 F (36.8 C)   Wt 223 lb 9.6 oz (101.4 kg)   SpO2 97%   BMI 32.93 kg/m    Subjective:    Patient ID: Kristopher [name], male    DOB: 1960-05-04, 56 y.o.   MRN: [id number]  HPI: Kristopher [name] is a 56 y.o. male  Chief Complaint  Patient presents with  . Hypertension   HYPERTENSION Hypertension status: better  Satisfied with current treatment? yes Duration of hypertension: chronic BP monitoring frequency:  not checking BP medication side effects:  no Medication compliance: excellent compliance Previous BP meds: amlodipine, valsartan, metoprolol, hctz Aspirin: no Recurrent headaches: yes Visual changes: no Palpitations: no Dyspnea: no Chest pain: no Lower extremity edema: no Dizzy/lightheaded: no  Relevant past medical, surgical, family and social history reviewed and updated as indicated. Interim medical history since our last visit reviewed. Allergies and medications reviewed and updated.  Review of Systems  Constitutional: Negative.   Respiratory: Negative.   Cardiovascular: Negative.   Musculoskeletal: Positive for arthralgias, back pain and myalgias. Negative for gait problem, joint swelling, neck pain and neck stiffness.  Psychiatric/Behavioral: Negative.     Per HPI unless specifically indicated above     Objective:    BP 134/86 (BP Location: Left Arm, Patient Position: Sitting, Cuff Size: Large)   Pulse 84   Temp 98.3 F (36.8 C)   Wt 223 lb 9.6 oz (101.4 kg)   SpO2 97%   BMI 32.93 kg/m   Wt Readings from Last 3 Encounters:  07/08/16 223 lb 9.6 oz (101.4 kg)  06/24/16 223 lb 1.6 oz (101.2 kg)  04/16/16 220 lb (99.8 kg)    Physical Exam  Constitutional: He is oriented to person, place, and time. He appears well-developed and well-nourished. No distress.  HENT:  Head: Normocephalic and atraumatic.  Right Ear: Hearing normal.  Left Ear: Hearing  normal.  Nose: Nose normal.  Eyes: Conjunctivae and lids are normal. Right eye exhibits no discharge. Left eye exhibits no discharge. No scleral icterus.  Cardiovascular: Normal rate, regular rhythm and intact distal pulses.  Exam reveals no gallop and no friction rub.   No murmur heard. Pulmonary/Chest: Effort normal and breath sounds normal. No respiratory distress. He has no wheezes. He has no rales. He exhibits no tenderness.  Musculoskeletal: Normal range of motion.  Neurological: He is alert and oriented to person, place, and time.  Skin: Skin is warm, dry and intact. No rash noted. He is not diaphoretic. No erythema. No pallor.  Psychiatric: He has a normal mood and affect. His speech is normal and behavior is normal. Judgment and thought content normal. Cognition and memory are normal.    Results for orders placed or performed in visit on 07/08/16  HM COLONOSCOPY  Result Value Ref Range   [facility] Colonoscopy See Report (in chart) See Report (in chart), Patient Reported      Assessment & Plan:   Problem List Items Addressed This Visit      Genitourinary   Benign hypertensive renal disease - Primary    Under good control. Will check BMP and await results, if good- recheck 6 months.       Relevant Orders   Basic metabolic panel     Other   Hypokalemia    Under good control. Will check BMP and await results, if good- recheck 6 months.  Relevant Orders   Basic metabolic panel       Follow up plan: Return Pending results- 1 or 6 months.

## 2016-07-08 NOTE — Assessment & Plan Note (Signed)
Under good control. Will check BMP and await results, if good- recheck 6 months.

## 2016-07-09 ENCOUNTER — Telehealth: Payer: Self-pay | Admitting: Family Medicine

## 2016-07-09 DIAGNOSIS — E876 Hypokalemia: Secondary | ICD-10-CM

## 2016-07-09 LAB — BASIC METABOLIC PANEL
BUN/Creatinine Ratio: 21 — ABNORMAL HIGH (ref 9–20)
BUN: 19 mg/dL (ref 6–24)
CO2: 22 mmol/L (ref 18–29)
Calcium: 9.2 mg/dL (ref 8.7–10.2)
Chloride: 103 mmol/L (ref 96–106)
Creatinine, Ser: 0.89 mg/dL (ref 0.76–1.27)
GFR calc Af Amer: 112 mL/min/{1.73_m2} (ref >59)
GFR calc non Af Amer: 97 mL/min/{1.73_m2} (ref >59)
Glucose: 175 mg/dL — ABNORMAL HIGH (ref 65–99)
Potassium: 3.2 mmol/L — ABNORMAL LOW (ref 3.5–5.2)
Sodium: 142 mmol/L (ref 134–144)

## 2016-07-09 MED ORDER — POTASSIUM CHLORIDE CRYS ER 20 MEQ PO TBCR: 20.0000 meq | EXTENDED_RELEASE_TABLET | Freq: Every day | ORAL | 3 refills | Status: DC

## 2016-07-09 NOTE — Telephone Encounter (Signed)
Please let him know that his potassium came back slightly low, I'd like him to start eating a banana or an orange daily and we'll recheck his levels in about a month. Thanks!

## 2016-07-09 NOTE — Telephone Encounter (Signed)
I sent through some potassium for him. We'll still check in a month.

## 2016-07-09 NOTE — Telephone Encounter (Signed)
Patient notified

## 2016-07-09 NOTE — Telephone Encounter (Signed)
Patient states that he eats at least 4-5 oranges a day and at least 1 banana day.

## 2016-07-10 ENCOUNTER — Ambulatory Visit: Payer: Medicare Other | Attending: Nurse Practitioner | Admitting: Nurse Practitioner

## 2016-07-10 ENCOUNTER — Encounter: Payer: Self-pay | Admitting: Nurse Practitioner

## 2016-07-10 VITALS — BP 137/90 | HR 90 | Temp 98.1°F | Resp 16 | Ht 70.0 in | Wt 223.0 lb

## 2016-07-10 DIAGNOSIS — Z880 Allergy status to penicillin: Secondary | ICD-10-CM | POA: Insufficient documentation

## 2016-07-10 DIAGNOSIS — M5126 Other intervertebral disc displacement, lumbar region: Secondary | ICD-10-CM

## 2016-07-10 DIAGNOSIS — M961 Postlaminectomy syndrome, not elsewhere classified: Secondary | ICD-10-CM | POA: Diagnosis not present

## 2016-07-10 DIAGNOSIS — Z5181 Encounter for therapeutic drug level monitoring: Secondary | ICD-10-CM | POA: Diagnosis not present

## 2016-07-10 DIAGNOSIS — M5416 Radiculopathy, lumbar region: Secondary | ICD-10-CM | POA: Diagnosis not present

## 2016-07-10 DIAGNOSIS — Z9889 Other specified postprocedural states: Secondary | ICD-10-CM | POA: Diagnosis not present

## 2016-07-10 DIAGNOSIS — M479 Spondylosis, unspecified: Secondary | ICD-10-CM | POA: Insufficient documentation

## 2016-07-10 DIAGNOSIS — Z79899 Other long term (current) drug therapy: Secondary | ICD-10-CM | POA: Insufficient documentation

## 2016-07-10 DIAGNOSIS — M47816 Spondylosis without myelopathy or radiculopathy, lumbar region: Secondary | ICD-10-CM

## 2016-07-10 DIAGNOSIS — I10 Essential (primary) hypertension: Secondary | ICD-10-CM | POA: Diagnosis not present

## 2016-07-10 DIAGNOSIS — Z79891 Long term (current) use of opiate analgesic: Secondary | ICD-10-CM | POA: Diagnosis not present

## 2016-07-10 DIAGNOSIS — G894 Chronic pain syndrome: Secondary | ICD-10-CM

## 2016-07-10 DIAGNOSIS — F17210 Nicotine dependence, cigarettes, uncomplicated: Secondary | ICD-10-CM | POA: Insufficient documentation

## 2016-07-10 DIAGNOSIS — G8929 Other chronic pain: Secondary | ICD-10-CM | POA: Diagnosis not present

## 2016-07-10 MED ORDER — OXYCODONE HCL 10 MG PO TABS: 10.0000 mg | ORAL_TABLET | Freq: Four times a day (QID) | ORAL | 0 refills | Status: DC | PRN

## 2016-07-10 MED ORDER — NONFORMULARY OR COMPOUNDED ITEM: 2 refills | Status: DC

## 2016-07-10 NOTE — Progress Notes (Signed)
Patient's Name: Kristopher [name]  MRN: [id number]  Referring Provider: [name], DO  DOB: 08-Mar-1960  PCP: [name], DO  DOS: 07/10/2016  Note by: Vevelyn Francois NP  Service setting: Ambulatory outpatient  Specialty: Interventional Pain Management  Location: [facility] (AMB) Pain Management Facility    Patient type: Established    Primary Reason(s) for Visit: Encounter for prescription drug management (Level of risk: moderate) CC: No chief complaint on file.  HPI  Kristopher [name] is a 56 y.o. year old, male patient, who comes today for a medication management evaluation. He has Chronic low back pain (Location of Primary Source of Pain) (Bilateral) (L>R); Lumbar spondylosis; Chronic lumbar radicular pain (Location of Secondary source of pain) (S1 dermatomal) (Left); 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); Chronic neck pain (posterior midline) (Bilateral) (L>R); Cervical spondylosis; Chronic cervical radicular pain (Location of Tertiary source of pain) (Bilateral) (C5/C6 dermatome) (L>R); Long term current use of opiate analgesic; Long term prescription opiate use; Opiate use (60 MME/Day); Opioid dependence ([place]); Encounter for therapeutic drug level monitoring; Cervical spinal stenosis (C4-5); Cervical foraminal stenosis (Bilateral C5-6); Hypokalemia; Retrolisthesis of L5-S1; Cervical disc herniation (C4-5 and C5-6); Lumbar disc herniation (L5-S1); Chronic sacroiliac joint pain (Bilateral) (L>R); Allergy history, anesthetic (Unconfirmed allergy to Lidocaine); Chronic hip pain (Left); Lumbar facet syndrome (Location of Primary Source of Pain) (Bilateral) (L>R); Chronic lower extremity pain (Location of Secondary source of pain) (Left); Chronic upper extremity pain (Location of Tertiary source of pain) (Bilateral) (L>R); Greater occipital neuralgia (Right); Chronic pain syndrome; Benign hypertensive renal disease; Migraine; Special screening for malignant neoplasms, colon; Benign  neoplasm of ascending colon; Polyp of sigmoid colon; Rectal polyp; Tobacco abuse; and GERD (gastroesophageal reflux disease) on his problem list. His primarily concern today is the No chief complaint on file.  Pain Assessment: Self-Reported Pain Score: 5  (education sheet given -pt changed score)/10 Clinically the patient looks like a 2/10 Reported level is compatible with observation.       Pain Type: Chronic pain Pain Location: Back Pain Orientation: Lower Pain Descriptors / Indicators: Numbness, Penetrating, Burning, Aching, Discomfort Pain Frequency: Constant  Kristopher [name] was last scheduled for an appointment on 04/16/16 for medication management. During today's appointment we reviewed Kristopher [name] chronic pain status, as well as his outpatient medication regimen. He has chronic lower back pain secondary to an elevated fall on 2014. He has radicular symptoms that goes down his left leg into his ankle. He has numbness, tingling and weakness. He admits that he has falls secondary to the weakness. He has completed 2 years of PT. He admits that the falls are better than they were in the past.  The patient  reports that he does not use drugs. His body mass index is 32 kg/m.  Further details on both, my assessment(s), as well as the proposed treatment plan, please see below.  Controlled Substance Pharmacotherapy Assessment REMS (Risk Evaluation and Mitigation Strategy)  Analgesic:Oxycodone IR 10 mg every 6 hours (40 mg/day of oxycodone) MME/day:60 mg/day  Ignatius Specking, RN  07/10/2016 10:28 AM  Sign at close encounter Nursing Pain Medication Assessment:  Safety precautions to be maintained throughout the outpatient stay will include: orient to surroundings, [name] bed in low position, maintain call bell within reach at all times, provide assistance with transfer out of bed and ambulation.  Medication Inspection Compliance: Pill count conducted under aseptic conditions, in front of the  patient. Neither the pills nor the bottle was removed from the patient's  sight at any time. Once count was completed pills were immediately returned to the patient in their original bottle.  Medication: See above Pill/Patch Count: 65 of 120 pills remain Pill/Patch Appearance: Markings consistent with prescribed medication Bottle Appearance: Standard pharmacy container. Clearly labeled. Filled Date: 04 / 27 / 2018 Last Medication intake:  Today   Pharmacokinetics: Liberation and absorption (onset of action): WNL Distribution (time to peak effect): WNL Metabolism and excretion (duration of action): WNL         Pharmacodynamics: Desired effects: Analgesia: Kristopher [name] reports >50% benefit. Functional ability: Patient reports that medication allows him to accomplish basic ADLs Clinically meaningful improvement in function (CMIF): Sustained CMIF goals met Perceived effectiveness: Described as relatively effective, allowing for increase in activities of daily living (ADL) Undesirable effects: Side-effects or Adverse reactions: None reported Monitoring: [place] PMP: Online review of the past 2-monthperiod conducted. Compliant with practice rules and regulations List of all UDS test(s) done:  Lab Results  Component Value Date   TOXASSSELUR FINAL 01/11/2016   [place]AL 05/31/2015   [place]AL 04/03/2015   TOXASSSELUR FINAL 02/01/2015   [place]FINAL 01/05/2015   Last UDS on record: ToxAssure Select 13  Date Value Ref Range Status  01/11/2016 FINAL  Final    Comment:    ==================================================================== TOXASSURE SELECT 13 (MW) ==================================================================== Test                             Result       Flag       Units Drug Present and Declared for Prescription Verification   Oxycodone                      80           EXPECTED   ng/mg creat   Oxymorphone                    289          EXPECTED    ng/mg creat   Noroxycodone                   161          EXPECTED   ng/mg creat    Sources of oxycodone include scheduled prescription medications.    Oxymorphone and noroxycodone are expected metabolites of    oxycodone. Oxymorphone is also available as a scheduled    prescription medication. ==================================================================== Test                      Result    Flag   Units      Ref Range   Creatinine              137              mg/dL      >=20 ==================================================================== Declared Medications:  The flagging and interpretation on this report are based on the  following declared medications.  Unexpected results may arise from  inaccuracies in the declared medications.  **Note: The testing scope of this panel includes these medications:  Oxycodone  **Note: The testing scope of this panel does not include following  reported medications:  Amlodipine (Exforge)  Naloxone  Sumatriptan  Topical  Valsartan (Exforge) ==================================================================== For clinical consultation, please call ([phone] ====================================================================    UDS interpretation: Compliant  Medication Assessment Form: Reviewed. Patient indicates being compliant with therapy Treatment compliance: Compliant Risk Assessment Profile: Aberrant behavior: See prior evaluations. None observed or detected today Comorbid factors increasing risk of overdose: See prior notes. No additional risks detected today Risk of substance use disorder (SUD): Low Opioid Risk Tool (ORT) Total Score: 0  Interpretation Table:  Score <3 = Low Risk for SUD  Score between 4-7 = Moderate Risk for SUD  Score >8 = High Risk for Opioid Abuse   Risk Mitigation Strategies:  Patient Counseling: Covered Patient-Prescriber Agreement (PPA): Present and active  Notification to other  healthcare providers: Done  Pharmacologic Plan: No change in therapy, at this time  Laboratory Chemistry  Inflammation Markers Lab Results  Component Value Date   CRP 0.7 04/03/2015   ESRSEDRATE 11 04/03/2015   (CRP: Acute Phase) (ESR: Chronic Phase) Renal Function Markers Lab Results  Component Value Date   BUN 19 07/08/2016   CREATININE 0.89 07/08/2016   GFRAA 112 07/08/2016   GFRNONAA 97 07/08/2016   Hepatic Function Markers Lab Results  Component Value Date   AST 31 06/24/2016   ALT 51 (H) 06/24/2016   ALBUMIN 4.2 06/24/2016   ALKPHOS 113 06/24/2016   Electrolytes Lab Results  Component Value Date   NA 142 07/08/2016   K 3.2 (L) 07/08/2016   CL 103 07/08/2016   CALCIUM 9.2 07/08/2016   MG 2.0 04/03/2015   Neuropathy Markers No results found for: [id number] Bone Pathology Markers Lab Results  Component Value Date   ALKPHOS 113 06/24/2016   CALCIUM 9.2 07/08/2016   Coagulation Parameters Lab Results  Component Value Date   PLT 211 06/24/2016   Cardiovascular Markers Lab Results  Component Value Date   HGB 13.9 01/28/2015   HCT 43.3 06/24/2016   Note: Lab results reviewed.  Recent Diagnostic Imaging Review  No results found. Note: Imaging results reviewed.          Meds  The patient has a current medication list which includes the following prescription(s): albuterol, amlodipine-valsartan, ery-tab, fluticasone-salmeterol, hydrochlorothiazide, metoprolol succinate, naloxone, NONFORMULARY OR COMPOUNDED ITEM, nortriptyline, omeprazole, oxycodone hcl, potassium chloride sa, sumatriptan, oxycodone hcl, and oxycodone hcl.  Current Outpatient Prescriptions on File Prior to Visit  Medication Sig  . albuterol (PROVENTIL HFA;VENTOLIN HFA) 108 (90 Base) MCG/ACT inhaler Inhale 2 puffs into the lungs every 6 (six) hours as needed for wheezing or shortness of breath.  [name] amLODipine-valsartan (EXFORGE) 10-320 MG tablet Take 1 tablet by mouth daily.  . ERY-TAB 333  MG EC tablet 333 mg as needed.   . Fluticasone-Salmeterol (ADVAIR DISKUS) 250-50 MCG/DOSE AEPB Inhale 1 puff into the lungs 2 (two) times daily.  . hydrochlorothiazide (HYDRODIURIL) 25 MG tablet Take 1 tablet (25 mg total) by mouth daily.  . metoprolol succinate (TOPROL-XL) 100 MG 24 hr tablet Take 1 tablet (100 mg total) by mouth daily. Take with or immediately following a meal.  . Naloxone HCl (NARCAN) 4 MG/0.1ML LIQD Place 1 Bottle into the nose once. , then call 911, repeat if needed in other nostril with new bottle.  . NONFORMULARY OR COMPOUNDED ITEM 10% Ketamine/2% Cyclobenzaprine/6% Gabapentin Cream Sig: 1-2 ml to affected area 3-4 times/day. Amount: 240 GM  . nortriptyline (PAMELOR) 25 MG capsule Take 1 capsule (25 mg total) by mouth at bedtime.  [name] omeprazole (PRILOSEC) 20 MG capsule Take 1 capsule (20 mg total) by mouth daily.  . potassium chloride SA (K-DUR,KLOR-CON) 20 MEQ tablet Take 1 tablet (20 mEq total) by mouth daily.  [name]  SUMAtriptan (IMITREX) 100 MG tablet Take 1 tablet earliest onset of headache.  May repeat once after 2 hours if headache persists or recurs.  Do not exceed 2 tablets in 24 hours   No current facility-administered medications on file prior to visit.    ROS  Constitutional: Denies any fever or chills Gastrointestinal: No reported hemesis, hematochezia, vomiting, or acute GI distress Musculoskeletal: Denies any acute onset joint swelling, redness, loss of ROM, or weakness Neurological: No reported episodes of acute onset apraxia, aphasia, dysarthria, agnosia, amnesia, paralysis, loss of coordination, or loss of consciousness  Allergies  Kristopher [name] is allergic to lidocaine; penicillins; strawberry (diagnostic); aspirin; and novocain [procaine].  [place]  Drug: Kristopher [name]  reports that he does not use drugs. Alcohol:  reports that he does not drink alcohol. Tobacco:  reports that he has been smoking Cigarettes.  He has a 26.25 pack-year smoking history. He has  never used smokeless tobacco. Medical:  has a past medical history of Allergy; Arthritis; Chronic back pain; Hypertension; and Migraines. Family: family history includes Cancer in his father; Diabetes in his sister; Thrombosis in his sister.  Past Surgical History:  Procedure Laterality Date  . APPENDECTOMY    . BACK SURGERY    . COLONOSCOPY WITH PROPOFOL N/A 02/19/2016   Procedure: COLONOSCOPY WITH PROPOFOL;  Surgeon: Lucilla Lame, MD;  Location: [facility];  Service: Endoscopy;  Laterality: N/A;  . FOOT SURGERY Left    Screws and plates  . KNEE SURGERY Left    X 2  . LEG SURGERY    . POLYPECTOMY N/A 02/19/2016   Procedure: POLYPECTOMY;  Surgeon: Lucilla Lame, MD;  Location: [facility];  Service: Endoscopy;  Laterality: N/A;   Constitutional Exam  General appearance: Well nourished, well developed, and well hydrated. In no apparent acute distress Vitals:   07/10/16 1009  BP: 137/90  Pulse: 90  Resp: 16  Temp: 98.1 F (36.7 C)  SpO2: 99%  Weight: 223 lb (101.2 kg)  Height: _0  (1.778 m)   BMI Assessment: Estimated body mass index is 32 kg/m as calculated from the following:   Height as of this encounter: _1  (1.778 m).   Weight as of this encounter: 223 lb (101.2 kg).  BMI interpretation table: BMI level Category Range association with higher incidence of chronic pain  <18 kg/m2 Underweight   18.5-24.9 kg/m2 Ideal body weight   25-29.9 kg/m2 Overweight Increased incidence by 20%  30-34.9 kg/m2 Obese (Class I) Increased incidence by 68%  35-39.9 kg/m2 Severe obesity (Class II) Increased incidence by 136%  >40 kg/m2 Extreme obesity (Class III) Increased incidence by 254%   BMI Readings from Last 4 Encounters:  07/10/16 32.00 kg/m  07/08/16 32.93 kg/m  06/24/16 32.86 kg/m  04/16/16 31.57 kg/m   Wt Readings from Last 4 Encounters:  07/10/16 223 lb (101.2 kg)  07/08/16 223 lb 9.6 oz (101.4 kg)  06/24/16 223 lb 1.6 oz (101.2 kg)  04/16/16 220  lb (99.8 kg)  Psych/Mental status: Alert, oriented x 3 (person, place, & time)       Eyes: PERLA Respiratory: No evidence of acute respiratory distress  Thoracic Spine Exam  Inspection: No masses, redness, or swelling Alignment: Symmetrical Functional ROM: Unrestricted ROM Stability: No instability detected Sensory: Unimpaired Muscle strength & Tone: No palpable anomalies  Lumbar Spine Exam  Inspection: Well healed scar from previous spine surgery detected Alignment: Symmetrical Functional ROM: Unrestricted ROM      Stability: No instability detected Muscle strength &  Tone: Functionally intact Sensory: Unimpaired Palpation: No palpable anomalies       Provocative Tests: Lumbar Hyperextension and rotation test: evaluation deferred today       Patrick's Maneuver: evaluation deferred today                    Gait & Posture Assessment  Ambulation: Unassisted Gait: Relatively normal for age and body habitus Posture: WNL   Lower Extremity Exam    Side: Right lower extremity  Side: Left lower extremity  Inspection: No masses, redness, swelling, or asymmetry. No contractures  Inspection: No masses, redness, swelling, or asymmetry. No contractures  Functional ROM: Unrestricted ROM          Functional ROM: Unrestricted ROM          Muscle strength & Tone: Functionally intact  Muscle strength & Tone: Functionally intact  Sensory: Unimpaired  Sensory: Unimpaired  Palpation: No palpable anomalies  Palpation: No palpable anomalies   Assessment  Primary Diagnosis & Pertinent Problem List: The primary encounter diagnosis was Lumbar spondylosis. Diagnoses of Chronic lumbar radicular pain (Location of Secondary source of pain) (S1 dermatomal) (Left), Lumbar disc herniation (L5-S1), 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), Chronic pain syndrome, and Long term current use of opiate analgesic were also pertinent to this visit.  Status Diagnosis   Controlled Controlled Controlled 1. Lumbar spondylosis   2. Chronic lumbar radicular pain (Location of Secondary source of pain) (S1 dermatomal) (Left)   3. Lumbar disc herniation (L5-S1)   4. 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)   5. Chronic pain syndrome   6. Long term current use of opiate analgesic      Plan of Care  Pharmacotherapy (Medications Ordered): Meds ordered this encounter  Medications  . Oxycodone HCl 10 MG TABS    Sig: Take 1 tablet (10 mg total) by mouth every 6 (six) hours as needed.    Dispense:  120 tablet    Refill:  0    Do not place this medication, or any other prescription from our practice, on "Automatic Refill". Patient may have prescription filled one day early if pharmacy is closed on scheduled refill date. Do not fill until: 07/28/16 To last until: 08/27/16    Order Specific Question:   Supervising Provider    Answer:   [name] [phone]  . Oxycodone HCl 10 MG TABS    Sig: Take 1 tablet (10 mg total) by mouth every 6 (six) hours as needed.    Dispense:  120 tablet    Refill:  0    Do not place this medication, or any other prescription from our practice, on "Automatic Refill". Patient may have prescription filled one day early if pharmacy is closed on scheduled refill date. Do not fill until: 08/27/16 To last until: 09/26/16    Order Specific Question:   Supervising Provider    Answer:   [name] [phone]  . Oxycodone HCl 10 MG TABS    Sig: Take 1 tablet (10 mg total) by mouth every 6 (six) hours as needed.    Dispense:  120 tablet    Refill:  0    Do not place this medication, or any other prescription from our practice, on "Automatic Refill". Patient may have prescription filled one day early if pharmacy is closed on scheduled refill date. Do not fill until: 09/26/16 To last until: 10/26/16    Order Specific Question:   Supervising Provider    Answer:  [name] [[id number]]   New  Prescriptions   No medications on file   Medications administered today: Kristopher [name] had no medications administered during this visit. Lab-work, procedure(s), and/or referral(s): Orders Placed This Encounter  Procedures  . ToxASSURE Select 13 (MW), Urine   Imaging and/or referral(s): None  Interventional therapies: Planned, scheduled, and/or pending:   None at this time. Claims to have anaphylactic allergy to Lidocaine.   Considering:   None at this time. Claims to have anaphylactic allergy to Lidocaine.   Palliative PRN treatment(s):   None at this time. Claims to have anaphylactic allergy to Lidocaine.   Provider-requested follow-up: Return in about 3 months (around 10/10/2016) for Medication Mgmt.  Future Appointments Date Time Provider [place]  07/18/2016 7:30 AM Pieter Partridge, DO [facility] None  08/12/2016 9:30 AM [name], DO [facility] None   Primary Care Physician: [name], DO Location: [facility] Outpatient Pain Management Facility Note by: Vevelyn Francois NP Date: 07/10/2016; Time: 10:52 AM  Pain Score Disclaimer: We use the NRS-11 scale. This is a self-reported, subjective measurement of pain severity with only modest accuracy. It is used primarily to identify changes within a particular patient. It must be understood that outpatient pain scales are significantly less accurate that those used for research, where they can be applied under ideal controlled circumstances with minimal exposure to variables. In reality, the score is likely to be a combination of pain intensity and pain affect, where pain affect describes the degree of emotional arousal or changes in action readiness caused by the sensory experience of pain. Factors such as social and work situation, setting, emotional state, anxiety levels, expectation, and prior pain experience may influence pain perception and show large inter-individual differences that may also be affected by time  variables.  Patient instructions provided during this appointment: Patient Instructions  ____________________________________________________________________________________________  Medication Rules  Applies to: All patients receiving prescriptions (written or electronic).  Pharmacy of record: Pharmacy where electronic prescriptions will be sent. If written prescriptions are taken to a different pharmacy, please inform the nursing staff. The pharmacy listed in the electronic medical record should be the one where you would like electronic prescriptions to be sent.  Prescription refills: Only during scheduled appointments. Applies to both, written and electronic prescriptions.  NOTE: The following applies primarily to controlled substances (Opioid Pain Medications)  Patient's responsibilities: 1. Pain Pills: Bring all pain pills to every appointment (except for procedure appointments). 2. Pill Bottles: Bring pills in original pharmacy bottle. Always bring newest bottle. Bring bottle, even if empty. 3. Medication refills: You are responsible for knowing and keeping track of what medications you need refilled. The day before your appointment, write a list of all prescriptions that need to be refilled. Bring that list to your appointment and give it to the admitting nurse. Prescriptions will be written only during appointments. If you forget a medication, it will not be "Called in", "Faxed", or "electronically sent". You will need to get another appointment to get these prescribed. 4. Prescription Accuracy: You are responsible for carefully inspecting your prescriptions before leaving our office. Have the discharge nurse carefully go over each prescription with you, before taking them home. Make sure that your name is accurately spelled, that your address is correct. Check the name and dose of your medication to make sure it is accurate. Check the number of pills, and the written instructions to  make sure they are clear and accurate. Make sure that you are given  enough medication to last until your next medication refill appointment. 5. Taking Medication: Take medication as prescribed. Never take more pills than instructed. Never take medication more frequently than prescribed. Taking less pills or less frequently is permitted and encouraged, when it comes to controlled substances (written prescriptions).  6. Inform other Doctors: Always inform, all of your healthcare providers, of all the medications you take. 7. Pain Medication from other Providers: You are not allowed to accept any additional pain medication from any other Doctor or Healthcare provider. There are two exceptions to this rule. (see below) In the event that you require additional pain medication, you are responsible for notifying us, as stated below. 8. Medication Agreement: You are responsible for carefully reading and following our Medication Agreement. This must be signed before receiving any prescriptions from our practice. Safely store a copy of your signed Agreement. Violations to the Agreement will result in no further prescriptions. (Additional copies of our Medication Agreement are available upon request.) 9. Laws, Rules, & Regulations: All patients are expected to follow all Federal and [employer], Rules, [employer]. Ignorance of the Laws does not constitute a valid excuse.  Exceptions: There are only two exceptions to the rule of not receiving pain medications from other Healthcare Providers. 1. Exception #1 (Emergencies): In the event of an emergency (i.e.: accident requiring emergency care), you are allowed to receive additional pain medication. However, you are responsible for: As soon as you are able, call our office [phone], at any time of the day or night, and leave a message stating your name, the date and nature of the emergency, and the name and dose of the medication prescribed. In the event  that your call is answered by a member of our staff, make sure to document and save the date, time, and the name of the person that took your information.  2. Exception #2 (Planned Surgery): In the event that you are scheduled by another doctor or dentist to have any type of surgery or procedure, you are allowed (for a period no longer than 30 days), to receive additional pain medication, for the acute post-op pain. However, in this case, you are responsible for picking up a copy of our "Post-op Pain Management for Surgeons" handout, and giving it to your surgeon or dentist. This document is available at our office, and does not require an appointment to obtain it. Simply go to our office during business hours (Monday-Thursday from 8:00 AM to 4:00 PM) (Friday 8:00 AM to 12:00 Noon) or if you have a scheduled appointment with [place], prior to your surgery, and ask for it by name. In addition, you will need to provide [place] with your name, name of your surgeon, type of surgery, and date of procedure or surgery.

## 2016-07-10 NOTE — Patient Instructions (Addendum)
____________________________________________________________________________________________  Medication Rules  Applies to: All patients receiving prescriptions (written or electronic).  Pharmacy of record: Pharmacy where electronic prescriptions will be sent. If written prescriptions are taken to a different pharmacy, please inform the nursing staff. The pharmacy listed in the electronic medical record should be the one where you would like electronic prescriptions to be sent.  Prescription refills: Only during scheduled appointments. Applies to both, written and electronic prescriptions.  NOTE: The following applies primarily to controlled substances (Opioid Pain Medications)  Patient's responsibilities: 1. Pain Pills: Bring all pain pills to every appointment (except for procedure appointments). 2. Pill Bottles: Bring pills in original pharmacy bottle. Always bring newest bottle. Bring bottle, even if empty. 3. Medication refills: You are responsible for knowing and keeping track of what medications you need refilled. The day before your appointment, write a list of all prescriptions that need to be refilled. Bring that list to your appointment and give it to the admitting nurse. Prescriptions will be written only during appointments. If you forget a medication, it will not be "Called in", "Faxed", or "electronically sent". You will need to get another appointment to get these prescribed. 4. Prescription Accuracy: You are responsible for carefully inspecting your prescriptions before leaving our office. Have the discharge nurse carefully go over each prescription with you, before taking them home. Make sure that your name is accurately spelled, that your address is correct. Check the name and dose of your medication to make sure it is accurate. Check the number of pills, and the written instructions to make sure they are clear and accurate. Make sure that you are given enough medication to last  until your next medication refill appointment. 5. Taking Medication: Take medication as prescribed. Never take more pills than instructed. Never take medication more frequently than prescribed. Taking less pills or less frequently is permitted and encouraged, when it comes to controlled substances (written prescriptions).  6. Inform other Doctors: Always inform, all of your healthcare providers, of all the medications you take. 7. Pain Medication from other Providers: You are not allowed to accept any additional pain medication from any other Doctor or Healthcare provider. There are two exceptions to this rule. (see below) In the event that you require additional pain medication, you are responsible for notifying us, as stated below. 8. Medication Agreement: You are responsible for carefully reading and following our Medication Agreement. This must be signed before receiving any prescriptions from our practice. Safely store a copy of your signed Agreement. Violations to the Agreement will result in no further prescriptions. (Additional copies of our Medication Agreement are available upon request.) 9. Laws, Rules, & Regulations: All patients are expected to follow all Federal and [employer], [employer], Rules, [employer]. Ignorance of the Laws does not constitute a valid excuse.  Exceptions: There are only two exceptions to the rule of not receiving pain medications from other Healthcare Providers. 1. Exception #1 (Emergencies): In the event of an emergency (i.e.: accident requiring emergency care), you are allowed to receive additional pain medication. However, you are responsible for: As soon as you are able, call our office [phone], at any time of the day or night, and leave a message stating your name, the date and nature of the emergency, and the name and dose of the medication prescribed. In the event that your call is answered by a member of our staff, make sure to document and save the date,  time, and the name of the person that  took your information.  2. Exception #2 (Planned Surgery): In the event that you are scheduled by another doctor or dentist to have any type of surgery or procedure, you are allowed (for a period no longer than 30 days), to receive additional pain medication, for the acute post-op pain. However, in this case, you are responsible for picking up a copy of our "Post-op Pain Management for Surgeons" handout, and giving it to your surgeon or dentist. This document is available at our office, and does not require an appointment to obtain it. Simply go to our office during business hours (Monday-Thursday from 8:00 AM to 4:00 PM) (Friday 8:00 AM to 12:00 Noon) or if you have a scheduled appointment with [place], prior to your surgery, and ask for it by name. In addition, you will need to provide [place] with your name, name of your surgeon, type of surgery, and date of procedure or surgery. Pain Management Discharge Instructions  General Discharge Instructions :  If you need to reach your doctor call: Monday-Friday 8:00 am - 4:00 pm at [phone] or toll free [phone].  After clinic hours [phone] to have operator reach doctor.  Bring all of your medication bottles to all your appointments in the pain clinic.  To cancel or reschedule your appointment with Pain Management please remember to call 24 hours in advance to avoid a fee.  Refer to the educational materials which you have been given on: General Risks, I had my Procedure. Discharge Instructions, Post Sedation.  Post Procedure Instructions:  The drugs you were given will stay in your system until tomorrow, so for the next 24 hours you should not drive, make any legal decisions or drink any alcoholic beverages.  You may eat anything you prefer, but it is better to start with liquids then soups and crackers, and gradually work up to solid foods.  Please notify your doctor immediately if you have any unusual  bleeding, trouble breathing or pain that is not related to your normal pain.  Depending on the type of procedure that was done, some parts of your body may feel week and/or numb.  This usually clears up by tonight or the next day.  Walk with the use of an assistive device or accompanied by an adult for the 24 hours.  You may use ice on the affected area for the first 24 hours.  Put ice in a Ziploc bag and cover with a towel and place against area 15 minutes on 15 minutes off.  You may switch to heat after 24 hours.

## 2016-07-10 NOTE — Progress Notes (Signed)
Nursing Pain Medication Assessment:  Safety precautions to be maintained throughout the outpatient stay will include: orient to surroundings, keep bed in low position, maintain call bell within reach at all times, provide assistance with transfer out of bed and ambulation.  Medication Inspection Compliance: Pill count conducted under aseptic conditions, in front of the patient. Neither the pills nor the bottle was removed from the patient's sight at any time. Once count was completed pills were immediately returned to the patient in their original bottle.  Medication: See above Pill/Patch Count: 65 of 120 pills remain Pill/Patch Appearance: Markings consistent with prescribed medication Bottle Appearance: Standard pharmacy container. Clearly labeled. Filled Date: 04 / 27 / 2018 Last Medication intake:  Today

## 2016-07-14 LAB — TOXASSURE SELECT 13 (MW), URINE

## 2016-07-18 ENCOUNTER — Ambulatory Visit: Payer: Medicare Other | Admitting: Neurology

## 2016-07-25 ENCOUNTER — Telehealth: Payer: Self-pay | Admitting: Pain Medicine

## 2016-07-25 NOTE — Telephone Encounter (Signed)
Talked with patient about medications and that Dr Dossie Arbour will not write a presciption for a few days to fix [place]. Instructed pt to stretch medications out. His current meds are to last until 07/28/16. And is unsure when he will get his prescription due to being OOT

## 2016-07-25 NOTE — Telephone Encounter (Signed)
Please call patient about scripts he received at last visit. He was having problems with getting them paid for and was told to mail them to be filled. Will not be done by time he needs scripts and wants to know if he can get a 7 day script. Please call patient asap to discuss what is going on.

## 2016-08-12 ENCOUNTER — Encounter: Payer: Self-pay | Admitting: Family Medicine

## 2016-08-12 ENCOUNTER — Ambulatory Visit (INDEPENDENT_AMBULATORY_CARE_PROVIDER_SITE_OTHER): Payer: Medicare Other | Admitting: Family Medicine

## 2016-08-12 VITALS — BP 136/80 | HR 83 | Temp 98.0°F | Wt 224.7 lb

## 2016-08-12 DIAGNOSIS — R202 Paresthesia of skin: Secondary | ICD-10-CM | POA: Diagnosis not present

## 2016-08-12 DIAGNOSIS — I129 Hypertensive chronic kidney disease with stage 1 through stage 4 chronic kidney disease, or unspecified chronic kidney disease: Secondary | ICD-10-CM | POA: Diagnosis not present

## 2016-08-12 DIAGNOSIS — E876 Hypokalemia: Secondary | ICD-10-CM | POA: Diagnosis not present

## 2016-08-12 DIAGNOSIS — T148XXA Other injury of unspecified body region, initial encounter: Secondary | ICD-10-CM | POA: Diagnosis not present

## 2016-08-12 MED ORDER — NORTRIPTYLINE HCL 10 MG PO CAPS: 10.0000 mg | ORAL_CAPSULE | Freq: Every day | ORAL | 1 refills | Status: DC

## 2016-08-12 NOTE — Progress Notes (Signed)
BP 136/80   Pulse 83   Temp 98 F (36.7 C)   Wt 224 lb 11.2 oz (101.9 kg)   SpO2 96%   BMI 32.24 kg/m    Subjective:    Patient ID: Kristopher [name], male    DOB: [birth year], 56 y.o.   MRN: [id number]  HPI: Kristopher [name] is a 56 y.o. male  Chief Complaint  Patient presents with  . Hypertension   HYPERTENSION- potassium has been running a bit low. Started on a supplement. Rechecking today. Hypertension status: stable  Satisfied with current treatment? yes Duration of hypertension: chronic BP monitoring frequency:  not checking BP medication side effects:  no Medication compliance: good compliance Aspirin: no Recurrent headaches: yes Visual changes: no Palpitations: no Dyspnea: no Chest pain: no Lower extremity edema: no Dizzy/lightheaded: no   NUMBNESS- stopped his nortryptyline and since then he has been having extra numbness and tingling in his arms and legs Duration: weeks Onset: sudden Location: arms and legs Bilateral: yes Symmetric: yes Decreased sensation: yes  Weakness: yes Pain: no Quality:  Numb and tingling Severity: moderate  Frequency: constant Trauma: no Recent illness: no Diabetes: no Thyroid disease: no  HIV: no  Alcoholism: no  Spinal cord injury: no Status: worse  Had a lot of swelling and bruising on his feet about 2 weeks ago. It resolved now.   Relevant past medical, surgical, family and social history reviewed and updated as indicated. Interim medical history since our last visit reviewed. Allergies and medications reviewed and updated.  Review of Systems  Constitutional: Negative.   Respiratory: Negative.   Cardiovascular: Positive for leg swelling. Negative for chest pain and palpitations.  Musculoskeletal: Negative.   Skin: Positive for color change. Negative for pallor, rash and wound.  Neurological: Positive for numbness. Negative for dizziness, tremors, seizures, syncope, facial asymmetry, speech difficulty, weakness,  light-headedness and headaches.  Psychiatric/Behavioral: Negative.     Per HPI unless specifically indicated above     Objective:    BP 136/80   Pulse 83   Temp 98 F (36.7 C)   Wt 224 lb 11.2 oz (101.9 kg)   SpO2 96%   BMI 32.24 kg/m   Wt Readings from Last 3 Encounters:  08/12/16 224 lb 11.2 oz (101.9 kg)  07/10/16 223 lb (101.2 kg)  07/08/16 223 lb 9.6 oz (101.4 kg)    Physical Exam  Constitutional: He is oriented to person, place, and time. He appears well-developed and well-nourished. No distress.  HENT:  Head: Normocephalic and atraumatic.  Right Ear: Hearing normal.  Left Ear: Hearing normal.  Nose: Nose normal.  Eyes: Conjunctivae and lids are normal. Right eye exhibits no discharge. Left eye exhibits no discharge. No scleral icterus.  Cardiovascular: Normal rate, regular rhythm, normal heart sounds and intact distal pulses.  Exam reveals no gallop and no friction rub.   No murmur heard. Pulmonary/Chest: Effort normal and breath sounds normal. No respiratory distress. He has no wheezes. He has no rales. He exhibits no tenderness.  Musculoskeletal: Normal range of motion.  Neurological: He is alert and oriented to person, place, and time.  Skin: Skin is warm, dry and intact. No rash noted. He is not diaphoretic. No erythema. No pallor.  Psychiatric: He has a normal mood and affect. His speech is normal and behavior is normal. Judgment and thought content normal. Cognition and memory are normal.  Nursing note and vitals reviewed.   Results for orders placed or performed in visit on 07/10/16  [employer]  Select 13 (MW), Urine  Result Value Ref Range   Summary FINAL       Assessment & Plan:   Problem List Items Addressed This Visit      Genitourinary   Benign hypertensive renal disease - Primary    Under good control on recheck. Continue current regimen. Continue to monitor. Call with any concerns.         Other   Hypokalemia    Rechecking levels today.  Await results. Continue to monitor.        Other Visit Diagnoses    Bruising       Checking CBC today. Continue elevation. Call with any concerns.    Relevant Orders   CBC with Differential/Platelet   Paresthesias       Likely due to stopping nortriptyline. Will decrease dose as 25mg  made him feel "like a zombie". Call with any concerns.        Follow up plan: Return in about 6 months (around 02/11/2017).

## 2016-08-12 NOTE — Assessment & Plan Note (Signed)
Under good control on recheck. Continue current regimen. Continue to monitor. Call with any concerns.  

## 2016-08-12 NOTE — Assessment & Plan Note (Signed)
Rechecking levels today. Await results. Continue to monitor.

## 2016-08-13 ENCOUNTER — Encounter: Payer: Self-pay | Admitting: Family Medicine

## 2016-08-13 LAB — BASIC METABOLIC PANEL
BUN/Creatinine Ratio: 17 (ref 9–20)
BUN: 17 mg/dL (ref 6–24)
CO2: 26 mmol/L (ref 20–29)
Calcium: 9.6 mg/dL (ref 8.7–10.2)
Chloride: 103 mmol/L (ref 96–106)
Creatinine, Ser: 0.98 mg/dL (ref 0.76–1.27)
GFR calc Af Amer: 101 mL/min/{1.73_m2} (ref >59)
GFR calc non Af Amer: 87 mL/min/{1.73_m2} (ref >59)
Glucose: 117 mg/dL — ABNORMAL HIGH (ref 65–99)
Potassium: 3.7 mmol/L (ref 3.5–5.2)
Sodium: 142 mmol/L (ref 134–144)

## 2016-08-13 LAB — CBC WITH DIFFERENTIAL/PLATELET
Basophils Absolute: 0 10*3/uL (ref 0.0–0.2)
Basos: 1 %
EOS (ABSOLUTE): 0.1 10*3/uL (ref 0.0–0.4)
Eos: 2 %
Hematocrit: 40.4 % (ref 37.5–51.0)
Hemoglobin: 13.7 g/dL (ref 13.0–17.7)
Immature Grans (Abs): 0 10*3/uL (ref 0.0–0.1)
Immature Granulocytes: 0 %
Lymphocytes Absolute: 1.8 10*3/uL (ref 0.7–3.1)
Lymphs: 43 %
MCH: 29.7 pg (ref 26.6–33.0)
MCHC: 33.9 g/dL (ref 31.5–35.7)
MCV: 88 fL (ref 79–97)
Monocytes Absolute: 0.3 10*3/uL (ref 0.1–0.9)
Monocytes: 8 %
Neutrophils Absolute: 1.9 10*3/uL (ref 1.4–7.0)
Neutrophils: 46 %
Platelets: 209 10*3/uL (ref 150–379)
RBC: 4.61 x10E6/uL (ref 4.14–5.80)
RDW: 14.1 % (ref 12.3–15.4)
WBC: 4.2 10*3/uL (ref 3.4–10.8)

## 2016-08-26 ENCOUNTER — Ambulatory Visit: Payer: Medicare Other | Admitting: Neurology

## 2016-08-27 ENCOUNTER — Telehealth: Payer: Self-pay | Admitting: Family Medicine

## 2016-08-27 NOTE — Telephone Encounter (Signed)
Will fill out and have ready for tomorrow.

## 2016-08-27 NOTE — Telephone Encounter (Signed)
Patient dropped off handicap placard form for Dr Wynetta Emery to complete. He is hoping to be able to pick this up tomorrow afternoon.  I placed the form in Dr [employer].  Thanks  [phone]

## 2016-08-28 NOTE — Telephone Encounter (Signed)
Called patient to let him know the form is ready to be picked up. He needs to complete his portion on the form and we need to make a copy once completed  Just FYI

## 2016-08-29 NOTE — Telephone Encounter (Signed)
Patient picked up the form, made a copy for the chart

## 2016-10-10 ENCOUNTER — Encounter: Payer: Self-pay | Admitting: Nurse Practitioner

## 2016-10-10 ENCOUNTER — Ambulatory Visit: Payer: Medicare Other | Attending: Nurse Practitioner | Admitting: Nurse Practitioner

## 2016-10-10 VITALS — BP 142/98 | HR 85 | Temp 98.4°F | Resp 18 | Ht 70.0 in | Wt 200.0 lb

## 2016-10-10 DIAGNOSIS — M5481 Occipital neuralgia: Secondary | ICD-10-CM | POA: Insufficient documentation

## 2016-10-10 DIAGNOSIS — E876 Hypokalemia: Secondary | ICD-10-CM | POA: Insufficient documentation

## 2016-10-10 DIAGNOSIS — M542 Cervicalgia: Secondary | ICD-10-CM | POA: Insufficient documentation

## 2016-10-10 DIAGNOSIS — M79602 Pain in left arm: Secondary | ICD-10-CM | POA: Insufficient documentation

## 2016-10-10 DIAGNOSIS — M545 Low back pain: Secondary | ICD-10-CM | POA: Diagnosis present

## 2016-10-10 DIAGNOSIS — I129 Hypertensive chronic kidney disease with stage 1 through stage 4 chronic kidney disease, or unspecified chronic kidney disease: Secondary | ICD-10-CM | POA: Insufficient documentation

## 2016-10-10 DIAGNOSIS — M4802 Spinal stenosis, cervical region: Secondary | ICD-10-CM | POA: Diagnosis not present

## 2016-10-10 DIAGNOSIS — K219 Gastro-esophageal reflux disease without esophagitis: Secondary | ICD-10-CM | POA: Insufficient documentation

## 2016-10-10 DIAGNOSIS — K621 Rectal polyp: Secondary | ICD-10-CM | POA: Diagnosis not present

## 2016-10-10 DIAGNOSIS — G894 Chronic pain syndrome: Secondary | ICD-10-CM | POA: Diagnosis not present

## 2016-10-10 DIAGNOSIS — M47896 Other spondylosis, lumbar region: Secondary | ICD-10-CM | POA: Diagnosis not present

## 2016-10-10 DIAGNOSIS — D122 Benign neoplasm of ascending colon: Secondary | ICD-10-CM | POA: Insufficient documentation

## 2016-10-10 DIAGNOSIS — M79605 Pain in left leg: Secondary | ICD-10-CM | POA: Diagnosis not present

## 2016-10-10 DIAGNOSIS — M25552 Pain in left hip: Secondary | ICD-10-CM | POA: Diagnosis not present

## 2016-10-10 DIAGNOSIS — M4696 Unspecified inflammatory spondylopathy, lumbar region: Secondary | ICD-10-CM

## 2016-10-10 DIAGNOSIS — M47816 Spondylosis without myelopathy or radiculopathy, lumbar region: Secondary | ICD-10-CM

## 2016-10-10 DIAGNOSIS — M5020 Other cervical disc displacement, unspecified cervical region: Secondary | ICD-10-CM | POA: Diagnosis not present

## 2016-10-10 DIAGNOSIS — Z79891 Long term (current) use of opiate analgesic: Secondary | ICD-10-CM | POA: Diagnosis not present

## 2016-10-10 DIAGNOSIS — Z1211 Encounter for screening for malignant neoplasm of colon: Secondary | ICD-10-CM | POA: Insufficient documentation

## 2016-10-10 DIAGNOSIS — M79601 Pain in right arm: Secondary | ICD-10-CM | POA: Insufficient documentation

## 2016-10-10 DIAGNOSIS — M5441 Lumbago with sciatica, right side: Secondary | ICD-10-CM

## 2016-10-10 DIAGNOSIS — Z720 Tobacco use: Secondary | ICD-10-CM | POA: Insufficient documentation

## 2016-10-10 DIAGNOSIS — M47892 Other spondylosis, cervical region: Secondary | ICD-10-CM | POA: Insufficient documentation

## 2016-10-10 DIAGNOSIS — G8929 Other chronic pain: Secondary | ICD-10-CM

## 2016-10-10 DIAGNOSIS — I10 Essential (primary) hypertension: Secondary | ICD-10-CM | POA: Insufficient documentation

## 2016-10-10 DIAGNOSIS — M5442 Lumbago with sciatica, left side: Secondary | ICD-10-CM

## 2016-10-10 MED ORDER — OXYCODONE HCL 10 MG PO TABS: 10.0000 mg | ORAL_TABLET | Freq: Four times a day (QID) | ORAL | 0 refills | Status: DC | PRN

## 2016-10-10 MED ORDER — NONFORMULARY OR COMPOUNDED ITEM
2 refills | Status: DC
Start: 2016-10-10 — End: 2017-04-03

## 2016-10-10 NOTE — Progress Notes (Signed)
Nursing Pain Medication Assessment:  Safety precautions to be maintained throughout the outpatient stay will include: orient to surroundings, keep bed in low position, maintain call bell within reach at all times, provide assistance with transfer out of bed and ambulation.  Medication Inspection Compliance: Pill count conducted under aseptic conditions, in front of the patient. Neither the pills nor the bottle was removed from the patient's sight at any time. Once count was completed pills were immediately returned to the patient in their original bottle.  Medication: Oxycodone IR Pill/Patch Count: 92 of 120 pills remain Pill/Patch Appearance: Markings consistent with prescribed medication Bottle Appearance: Standard pharmacy container. Clearly labeled. Filled Date:08/02/ 2018 Last Medication intake:  Today

## 2016-10-10 NOTE — Patient Instructions (Addendum)
____________________________________________________________________________________________  Medication Rules  Applies to: All patients receiving prescriptions (written or electronic).  Pharmacy of record: Pharmacy where electronic prescriptions will be sent. If written prescriptions are taken to a different pharmacy, please inform the nursing staff. The pharmacy listed in the electronic medical record should be the one where you would like electronic prescriptions to be sent.  Prescription refills: Only during scheduled appointments. Applies to both, written and electronic prescriptions.  NOTE: The following applies primarily to controlled substances (Opioid* Pain Medications).   Patient's responsibilities: 1. Pain Pills: Bring all pain pills to every appointment (except for procedure appointments). 2. Pill Bottles: Bring pills in original pharmacy bottle. Always bring newest bottle. Bring bottle, even if empty. 3. Medication refills: You are responsible for knowing and keeping track of what medications you need refilled. The day before your appointment, write a list of all prescriptions that need to be refilled. Bring that list to your appointment and give it to the admitting nurse. Prescriptions will be written only during appointments. If you forget a medication, it will not be "Called in", "Faxed", or "electronically sent". You will need to get another appointment to get these prescribed. 4. Prescription Accuracy: You are responsible for carefully inspecting your prescriptions before leaving our office. Have the discharge nurse carefully go over each prescription with you, before taking them home. Make sure that your name is accurately spelled, that your address is correct. Check the name and dose of your medication to make sure it is accurate. Check the number of pills, and the written instructions to make sure they are clear and accurate. Make sure that you are given enough medication to  last until your next medication refill appointment. 5. Taking Medication: Take medication as prescribed. Never take more pills than instructed. Never take medication more frequently than prescribed. Taking less pills or less frequently is permitted and encouraged, when it comes to controlled substances (written prescriptions).  6. Inform other Doctors: Always inform, all of your healthcare providers, of all the medications you take. 7. Pain Medication from other Providers: You are not allowed to accept any additional pain medication from any other Doctor or Healthcare provider. There are two exceptions to this rule. (see below) In the event that you require additional pain medication, you are responsible for notifying us, as stated below. 8. Medication Agreement: You are responsible for carefully reading and following our Medication Agreement. This must be signed before receiving any prescriptions from our practice. Safely store a copy of your signed Agreement. Violations to the Agreement will result in no further prescriptions. (Additional copies of our Medication Agreement are available upon request.) 9. Laws, Rules, & Regulations: All patients are expected to follow all Federal and State Laws, Statutes, Rules, & Regulations. Ignorance of the Laws does not constitute a valid excuse. The use of any illegal substances is prohibited. 10. Adopted CDC guidelines & recommendations: Target dosing levels will be at or below 60 MME/day. Use of benzodiazepines** is not recommended.  Exceptions: There are only two exceptions to the rule of not receiving pain medications from other Healthcare Providers. 1. Exception #1 (Emergencies): In the event of an emergency (i.e.: accident requiring emergency care), you are allowed to receive additional pain medication. However, you are responsible for: As soon as you are able, call our office [phone], at any time of the day or night, and leave a message stating your  name, the date and nature of the emergency, and the name and dose of the medication   prescribed. In the event that your call is answered by a member of our staff, make sure to document and save the date, time, and the name of the person that took your information.  2. Exception #2 (Planned Surgery): In the event that you are scheduled by another doctor or dentist to have any type of surgery or procedure, you are allowed (for a period no longer than 30 days), to receive additional pain medication, for the acute post-op pain. However, in this case, you are responsible for picking up a copy of our "Post-op Pain Management for Surgeons" handout, and giving it to your surgeon or dentist. This document is available at our office, and does not require an appointment to obtain it. Simply go to our office during business hours (Monday-Thursday from 8:00 AM to 4:00 PM) (Friday 8:00 AM to 12:00 Noon) or if you have a scheduled appointment with [place], prior to your surgery, and ask for it by name. In addition, you will need to provide [place] with your name, name of your surgeon, type of surgery, and date of procedure or surgery.  *Opioid medications include: morphine, codeine, oxycodone, oxymorphone, hydrocodone, hydromorphone, meperidine, tramadol, tapentadol, buprenorphine, fentanyl, methadone. **Benzodiazepine medications include: diazepam (Valium), alprazolam (Xanax), clonazepam (Klonopine), lorazepam (Ativan), clorazepate (Tranxene), chlordiazepoxide (Librium), estazolam (Prosom), oxazepam (Serax), temazepam (Restoril), triazolam (Halcion)  ____________________________________________________________________________________________ ____________________________________________________________________________________________  Pain Scale  Introduction: The pain score used by this practice is the Verbal Numerical Rating Scale (VNRS-11). This is an 11-point scale. It is for adults and children 10 years or older. There are  significant differences in how the pain score is reported, used, and applied. Forget everything you learned in the past and learn this scoring system.  General Information: The scale should reflect your current level of pain. Unless you are specifically asked for the level of your worst pain, or your average pain. If you are asked for one of these two, then it should be understood that it is over the past 24 hours.  Basic Activities of Daily Living (ADL): Personal hygiene, dressing, eating, transferring, and using restroom.  Instructions: Most patients tend to report their level of pain as a combination of two factors, their physical pain and their psychosocial pain. This last one is also known as "suffering" and it is reflection of how physical pain affects you socially and psychologically. From now on, report them separately. From this point on, when asked to report your pain level, report only your physical pain. Use the following table for reference.  Pain Clinic Pain Levels (0-5/10)  Pain Level Score  Description  No Pain 0   Mild pain 1 Nagging, annoying, but does not interfere with basic activities of daily living (ADL). Patients are able to eat, bathe, get dressed, toileting (being able to get on and off the toilet and perform personal hygiene functions), transfer (move in and out of bed or a chair without assistance), and maintain continence (able to control bladder and bowel functions). Blood pressure and heart rate are unaffected. A normal heart rate for a healthy adult ranges from 60 to 100 bpm (beats per minute).   Mild to moderate pain 2 Noticeable and distracting. Impossible to hide from other people. More frequent flare-ups. Still possible to adapt and function close to normal. It can be very annoying and may have occasional stronger flare-ups. With discipline, patients may get used to it and adapt.   Moderate pain 3 Interferes significantly with activities of daily living (ADL). It  becomes difficult to  feed, bathe, get dressed, get on and off the toilet or to perform personal hygiene functions. Difficult to get in and out of bed or a chair without assistance. Very distracting. With effort, it can be ignored when deeply involved in activities.   Moderately severe pain 4 Impossible to ignore for more than a few minutes. With effort, patients may still be able to manage work or participate in some social activities. Very difficult to concentrate. Signs of autonomic nervous system discharge are evident: dilated pupils (mydriasis); mild sweating (diaphoresis); sleep interference. Heart rate becomes elevated (>115 bpm). Diastolic blood pressure (lower number) rises above 100 mmHg. Patients find relief in laying down and not moving.   Severe pain 5 Intense and extremely unpleasant. Associated with frowning face and frequent crying. Pain overwhelms the senses.  Ability to do any activity or maintain social relationships becomes significantly limited. Conversation becomes difficult. Pacing back and forth is common, as getting into a comfortable position is nearly impossible. Pain wakes you up from deep sleep. Physical signs will be obvious: pupillary dilation; increased sweating; goosebumps; brisk reflexes; cold, clammy hands and feet; nausea, vomiting or dry heaves; loss of appetite; significant sleep disturbance with inability to fall asleep or to remain asleep. When persistent, significant weight loss is observed due to the complete loss of appetite and sleep deprivation.  Blood pressure and heart rate becomes significantly elevated. Caution: If elevated blood pressure triggers a pounding headache associated with blurred vision, then the patient should immediately seek attention at an urgent or emergency care unit, as these may be signs of an impending stroke.    Emergency Department Pain Levels (6-10/10)  Emergency Room Pain 6 Severely limiting. Requires emergency care and should not be  seen or managed at an outpatient pain management facility. Communication becomes difficult and requires great effort. Assistance to reach the emergency department may be required. Facial flushing and profuse sweating along with potentially dangerous increases in heart rate and blood pressure will be evident.   Distressing pain 7 Self-care is very difficult. Assistance is required to transport, or use restroom. Assistance to reach the emergency department will be required. Tasks requiring coordination, such as bathing and getting dressed become very difficult.   Disabling pain 8 Self-care is no longer possible. At this level, pain is disabling. The individual is unable to do even the most "basic" activities such as walking, eating, bathing, dressing, transferring to a bed, or toileting. Fine motor skills are lost. It is difficult to think clearly.   Incapacitating pain 9 Pain becomes incapacitating. Thought processing is no longer possible. Difficult to remember your own name. Control of movement and coordination are lost.   The worst pain imaginable 10 At this level, most patients pass out from pain. When this level is reached, collapse of the autonomic nervous system occurs, leading to a sudden drop in blood pressure and heart rate. This in turn results in a temporary and dramatic drop in blood flow to the brain, leading to a loss of consciousness. Fainting is one of the body's self defense mechanisms. Passing out puts the brain in a calmed state and causes it to shut down for a while, in order to begin the healing process.    Summary: 1. Refer to this scale when providing [place] with your pain level. 2. Be accurate and careful when reporting your pain level. This will help with your care. 3. Over-reporting your pain level will lead to loss of credibility. 4. Even a level of 1/10  means that there is pain and will be treated at our facility. 5. High, inaccurate reporting will be documented as "Symptom  Exaggeration", leading to loss of credibility and suspicions of possible secondary gains such as obtaining more narcotics, or wanting to appear disabled, for fraudulent reasons. 6. Only pain levels of 5 or below will be seen at our facility. 7. Pain levels of 6 and above will be sent to the Emergency Department and the appointment cancelled.  You were given 3 prescriptions for Oxycodone and one for compounding cream today. ____________________________________________________________________________________________

## 2016-10-10 NOTE — Progress Notes (Signed)
Patient's Name: Kristopher [name]  MRN: [id number]  Referring Provider: [name], DO  DOB: 1960-04-27  PCP: [name], DO  DOS: 10/10/2016  Note by: Vevelyn Francois NP  Service setting: Ambulatory outpatient  Specialty: Interventional Pain Management  Location: [facility] (AMB) Pain Management Facility    Patient type: Established    Primary Reason(s) for Visit: Encounter for prescription drug management. (Level of risk: moderate)  CC: Back Pain (lower)  HPI  Kristopher [name] is a 56 y.o. year old, male patient, who comes today for a medication management evaluation. He has Chronic low back pain (Location of Primary Source of Pain) (Bilateral) (L>R); Lumbar spondylosis; Chronic lumbar radicular pain (Location of Secondary source of pain) (S1 dermatomal) (Left); 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); Chronic neck pain (posterior midline) (Bilateral) (L>R); Cervical spondylosis; Chronic cervical radicular pain (Location of Tertiary source of pain) (Bilateral) (C5/C6 dermatome) (L>R); Long term current use of opiate analgesic; Long term prescription opiate use; Opiate use (60 MME/Day); Opioid dependence ([place]); Encounter for therapeutic drug level monitoring; Cervical spinal stenosis (C4-5); Cervical foraminal stenosis (Bilateral C5-6); Hypokalemia; Retrolisthesis of L5-S1; Cervical disc herniation (C4-5 and C5-6); Lumbar disc herniation (L5-S1); Chronic sacroiliac joint pain (Bilateral) (L>R); Allergy history, anesthetic (Unconfirmed allergy to Lidocaine); Chronic hip pain (Left); Lumbar facet syndrome (Location of Primary Source of Pain) (Bilateral) (L>R); Chronic lower extremity pain (Location of Secondary source of pain) (Left); Chronic upper extremity pain (Location of Tertiary source of pain) (Bilateral) (L>R); Greater occipital neuralgia (Right); Chronic pain syndrome; Benign hypertensive renal disease; Migraine; Special screening for malignant neoplasms, colon; Benign neoplasm  of ascending colon; Polyp of sigmoid colon; Rectal polyp; Tobacco abuse; and GERD (gastroesophageal reflux disease) on his problem list. His primarily concern today is the Back Pain (lower)  Pain Assessment: Location: Lower Back Radiating: both hips, worse in left Onset: More than a month ago Duration: Chronic pain Quality: Stabbing (pins and needles) Severity: 6 /10 (self-reported pain score)  Note: Reported level is inconsistent with clinical observations. Clinically the patient looks like a 3/10 Information on the proper use of the pain scale provided to the patient today Effect on ADL:   Timing: Constant Modifying factors: medication  Kristopher [name] was last scheduled for an appointment on 07/10/2016 for medication management. During today's appointment we reviewed Kristopher [name] chronic pain status, as well as his outpatient medication regimen. He feels like the pain is getting worse over the last 2 months. He states that the pain is worse with sitting.   The patient  reports that he does not use drugs. His body mass index is 28.7 kg/m.  Further details on both, my assessment(s), as well as the proposed treatment plan, please see below.  Controlled Substance Pharmacotherapy Assessment REMS (Risk Evaluation and Mitigation Strategy)  Analgesic:Oxycodone IR 10 mg every 6 hours (40 mg/day of oxycodone) MME/day:60 mg/day  Landis Martins, RN  10/10/2016  9:50 AM  Sign at close encounter Nursing Pain Medication Assessment:  Safety precautions to be maintained throughout the outpatient stay will include: orient to surroundings, keep bed in low position, maintain call bell within reach at all times, provide assistance with transfer out of bed and ambulation.  Medication Inspection Compliance: Pill count conducted under aseptic conditions, in front of the patient. Neither the pills nor the bottle was removed from the patient's sight at any time. Once count was completed pills were immediately  returned to the patient in their original bottle.  Medication: Oxycodone IR Pill/Patch  Count: 92 of 120 pills remain Pill/Patch Appearance: Markings consistent with prescribed medication Bottle Appearance: Standard pharmacy container. Clearly labeled. Filled Date:08/02/ 2018 Last Medication intake:  Today   Pharmacokinetics: Liberation and absorption (onset of action): WNL Distribution (time to peak effect): WNL Metabolism and excretion (duration of action): WNL         Pharmacodynamics: Desired effects: Analgesia: Kristopher [name] reports >50% benefit. Functional ability: Patient reports that medication allows him to accomplish basic ADLs Clinically meaningful improvement in function (CMIF): Sustained CMIF goals met Perceived effectiveness: Described as relatively effective, allowing for increase in activities of daily living (ADL) Undesirable effects: Side-effects or Adverse reactions: None reported Monitoring:  PMP: Online review of the past 10-monthperiod conducted. Compliant with practice rules and regulations List of all UDS test(s) done:  Lab Results  Component Value Date   TOXASSSELUR FINAL 01/11/2016   TH[place]iaFINAL 05/31/2015   TR[place]FINAL 04/03/2015   TOXASSSELUR FINAL 02/01/2015   TElizabethFINAL 01/05/2015   SUMMARY FINAL 07/10/2016   Last UDS on record: [employer]ox[employer] Select 13  Date Value Ref Range Status  01/11/2016 FINAL  Final    Comment:    ==================================================================== TOXASSURE SELECT 13 (MW) ==================================================================== Test                             Result       Flag       Units Drug Present and Declared for Prescription Verification   Oxycodone                      80           EXPECTED   ng/mg creat   Oxymorphone                    289          EXPECTED   ng/mg creat   Noroxycodone                   161          EXPECTED   ng/mg creat    Sources of oxycodone  include scheduled prescription medications.    Oxymorphone and noroxycodone are expected metabolites of    oxycodone. Oxymorphone is also available as a scheduled    prescription medication. ==================================================================== Test                      Result    Flag   Units      Ref Range   Creatinine              137              mg/dL      >=20 ==================================================================== Declared Medications:  The flagging and interpretation on this report are based on the  following declared medications.  Unexpected results may arise from  inaccuracies in the declared medications.  **Note: The testing scope of this panel includes these medications:  Oxycodone  **Note: The testing scope of this panel does not include following  reported medications:  Amlodipine (Exforge)  Naloxone  Sumatriptan  Topical  Valsartan (Exforge) ==================================================================== For clinical consultation, please call ([phone] ====================================================================    Summary  Date Value Ref Range Status  07/10/2016 FINAL  Final    Comment:    ==================================================================== TOXASSURE SELECT 13 (MW) ==================================================================== Test  Result       Flag       Units Drug Present and Declared for Prescription Verification   Oxycodone                      109          EXPECTED   ng/mg creat   Oxymorphone                    296          EXPECTED   ng/mg creat   Noroxycodone                   211          EXPECTED   ng/mg creat   Noroxymorphone                 69           EXPECTED   ng/mg creat    Sources of oxycodone are scheduled prescription medications.    Oxymorphone, noroxycodone, and noroxymorphone are expected    metabolites of oxycodone. Oxymorphone is also  available as a    scheduled prescription medication. ==================================================================== Test                      Result    Flag   Units      Ref Range   Creatinine              139              mg/dL      >=20 ==================================================================== Declared Medications:  The flagging and interpretation on this report are based on the  following declared medications.  Unexpected results may arise from  inaccuracies in the declared medications.  **Note: The testing scope of this panel includes these medications:  Oxycodone  **Note: The testing scope of this panel does not include following  reported medications:  Albuterol  Erythromycin  Fluticasone (Advair)  Hydrochlorothiazide  Metoprolol  Naloxone  Nortriptyline  Omeprazole  Potassium  Salmeterol (Advair)  Sumatriptan  Topical ==================================================================== For clinical consultation, please call [phone]. ====================================================================    UDS interpretation: Compliant          Medication Assessment Form: Reviewed. Patient indicates being compliant with therapy Treatment compliance: Compliant Risk Assessment Profile: Aberrant behavior: See prior evaluations. None observed or detected today Comorbid factors increasing risk of overdose: See prior notes. No additional risks detected today Risk of substance use disorder (SUD): Low     Opioid Risk Tool - 10/10/16 0947      Family History of Substance Abuse   Alcohol Negative   Illegal Drugs Negative   Rx Drugs Negative     Personal History of Substance Abuse   Alcohol Negative   Illegal Drugs Negative   Rx Drugs Negative     Age   Age between 35-45 years  No     History of Preadolescent Sexual Abuse   History of Preadolescent Sexual Abuse Negative or Male     Psychological Disease   Psychological Disease Negative    Depression Negative     Total Score   Opioid Risk Tool Scoring 0   Opioid Risk Interpretation Low Risk     ORT Scoring interpretation table:  Score <3 = Low Risk for SUD  Score between 4-7 = Moderate Risk for SUD  Score >8 = High Risk for Opioid Abuse   Risk  Mitigation Strategies:  Patient Counseling: Covered Patient-Prescriber Agreement (PPA): Present and active  Notification to other healthcare providers: Done  Pharmacologic Plan: No change in therapy, at this time  Laboratory Chemistry  Inflammation Markers (CRP: Acute Phase) (ESR: Chronic Phase) Lab Results  Component Value Date   CRP 0.7 04/03/2015   ESRSEDRATE 11 04/03/2015                 Renal Function Markers Lab Results  Component Value Date   BUN 17 08/12/2016   CREATININE 0.98 08/12/2016   GFRAA 101 08/12/2016   GFRNONAA 87 08/12/2016                 Hepatic Function Markers Lab Results  Component Value Date   AST 31 06/24/2016   ALT 51 (H) 06/24/2016   ALBUMIN 4.2 06/24/2016   ALKPHOS 113 06/24/2016                 Electrolytes Lab Results  Component Value Date   NA 142 08/12/2016   K 3.7 08/12/2016   CL 103 08/12/2016   CALCIUM 9.6 08/12/2016   MG 2.0 04/03/2015                 Neuropathy Markers No results found for: [id number]               Bone Pathology Markers Lab Results  Component Value Date   ALKPHOS 113 06/24/2016   CALCIUM 9.6 08/12/2016                 Coagulation Parameters Lab Results  Component Value Date   PLT 209 08/12/2016                 Cardiovascular Markers Lab Results  Component Value Date   HGB 13.7 08/12/2016   HCT 40.4 08/12/2016                 Note: Lab results reviewed.  Recent Diagnostic Imaging Review  No results found. Note: Imaging results reviewed.          Meds   Current Meds  Medication Sig  . amLODipine-valsartan (EXFORGE) 10-320 MG tablet Take 1 tablet by mouth daily.  . ERY-TAB 333 MG EC tablet 333 mg as needed.   .  hydrochlorothiazide (HYDRODIURIL) 25 MG tablet Take 1 tablet (25 mg total) by mouth daily.  . metoprolol succinate (TOPROL-XL) 100 MG 24 hr tablet Take 1 tablet (100 mg total) by mouth daily. Take with or immediately following a meal.  . Naloxone HCl (NARCAN) 4 MG/0.1ML LIQD Place 1 Bottle into the nose once. , then call 911, repeat if needed in other nostril with new bottle.  . nortriptyline (PAMELOR) 10 MG capsule Take 1 capsule (10 mg total) by mouth at bedtime.  [name] omeprazole (PRILOSEC) 20 MG capsule Take 1 capsule (20 mg total) by mouth daily.  . potassium chloride SA (K-DUR,KLOR-CON) 20 MEQ tablet Take 1 tablet (20 mEq total) by mouth daily.  . SUMAtriptan (IMITREX) 100 MG tablet Take 1 tablet earliest onset of headache.  May repeat once after 2 hours if headache persists or recurs.  Do not exceed 2 tablets in 24 hours  . [DISCONTINUED] Oxycodone HCl 10 MG TABS Take 1 tablet (10 mg total) by mouth every 6 (six) hours as needed.    ROS  Constitutional: Denies any fever or chills Gastrointestinal: No reported hemesis, hematochezia, vomiting, or acute GI distress Musculoskeletal: Denies any acute onset joint swelling, redness, loss of  ROM, or weakness Neurological: No reported episodes of acute onset apraxia, aphasia, dysarthria, agnosia, amnesia, paralysis, loss of coordination, or loss of consciousness  Allergies  Kristopher [name] is allergic to lidocaine; penicillins; strawberry (diagnostic); aspirin; and novocain [procaine].  [name]  Drug: Kristopher [name]  reports that he does not use drugs. Alcohol:  reports that he does not drink alcohol. Tobacco:  reports that he has been smoking Cigarettes.  He has a 26.25 pack-year smoking history. He has never used smokeless tobacco. Medical:  has a past medical history of Allergy; Arthritis; Chronic back pain; Hypertension; and Migraines. Surgical: Kristopher [name]  has a past surgical history that includes Back surgery; Appendectomy; Leg Surgery; Knee surgery  (Left); Foot surgery (Left); Colonoscopy with propofol (N/A, 02/19/2016); and polypectomy (N/A, 02/19/2016). Family: family history includes Cancer in his father; Diabetes in his sister; Thrombosis in his sister.  Constitutional Exam  General appearance: Well nourished, well developed, and well hydrated. In no apparent acute distress Vitals:   10/10/16 0942  BP: (!) 142/98  Pulse: 85  Resp: 18  Temp: 98.4 F (36.9 C)  TempSrc: Oral  SpO2: 98%  Weight: 200 lb (90.7 kg)  Height: _0  (1.778 m)   BMI Assessment: Estimated body mass index is 28.7 kg/m as calculated from the following:   Height as of this encounter: _1  (1.778 m).   Weight as of this encounter: 200 lb (90.7 kg).  BMI interpretation table: BMI level Category Range association with higher incidence of chronic pain  <18 kg/m2 Underweight   18.5-24.9 kg/m2 Ideal body weight   25-29.9 kg/m2 Overweight Increased incidence by 20%  30-34.9 kg/m2 Obese (Class I) Increased incidence by 68%  35-39.9 kg/m2 Severe obesity (Class II) Increased incidence by 136%  >40 kg/m2 Extreme obesity (Class III) Increased incidence by 254%   BMI Readings from Last 4 Encounters:  10/10/16 28.70 kg/m  08/12/16 32.24 kg/m  07/10/16 32.00 kg/m  07/08/16 32.93 kg/m   Wt Readings from Last 4 Encounters:  10/10/16 200 lb (90.7 kg)  08/12/16 224 lb 11.2 oz (101.9 kg)  07/10/16 223 lb (101.2 kg)  07/08/16 223 lb 9.6 oz (101.4 kg)  Psych/Mental status: Alert, oriented x 3 (person, place, & time)       Eyes: PERLA Respiratory: No evidence of acute respiratory distress  Cervical Spine Area Exam  Skin & Axial Inspection: No masses, redness, edema, swelling, or associated skin lesions Alignment: Symmetrical Functional ROM: Unrestricted ROM      Stability: No instability detected Muscle Tone/Strength: Functionally intact. No obvious neuro-muscular anomalies detected. Sensory (Neurological): Unimpaired Palpation: No palpable anomalies               Upper Extremity (UE) Exam    Side: Right upper extremity  Side: Left upper extremity  Skin & Extremity Inspection: Skin color, temperature, and hair growth are WNL. No peripheral edema or cyanosis. No masses, redness, swelling, asymmetry, or associated skin lesions. No contractures.  Skin & Extremity Inspection: Skin color, temperature, and hair growth are WNL. No peripheral edema or cyanosis. No masses, redness, swelling, asymmetry, or associated skin lesions. No contractures.  Functional ROM: Unrestricted ROM          Functional ROM: Unrestricted ROM          Muscle Tone/Strength: Functionally intact. No obvious neuro-muscular anomalies detected.  Muscle Tone/Strength: Functionally intact. No obvious neuro-muscular anomalies detected.  Sensory (Neurological): Unimpaired  Sensory (Neurological): Unimpaired  Palpation: No palpable anomalies  Palpation: No palpable anomalies              Specialized Test(s): Deferred         Specialized Test(s): Deferred          Thoracic Spine Area Exam  Skin & Axial Inspection: No masses, redness, or swelling Alignment: Symmetrical Functional ROM: Unrestricted ROM Stability: No instability detected Muscle Tone/Strength: Functionally intact. No obvious neuro-muscular anomalies detected. Sensory (Neurological): Unimpaired Muscle strength & Tone: No palpable anomalies  Lumbar Spine Area Exam  Skin & Axial Inspection: Well healed scar from previous spine surgery detected Alignment: Symmetrical Functional ROM: Unrestricted ROM      Stability: No instability detected Muscle Tone/Strength: Increased muscle tone over affected area Sensory (Neurological): Unimpaired Palpation: Complains of area being tender to palpation       Provocative Tests: Lumbar Hyperextension and rotation test: Positive       Lumbar Lateral bending test: Positive       Patrick's Maneuver: evaluation deferred today                    Gait & Posture Assessment   Ambulation: Unassisted Gait: Relatively normal for age and body habitus Posture: WNL   Lower Extremity Exam    Side: Right lower extremity  Side: Left lower extremity  Skin & Extremity Inspection: Skin color, temperature, and hair growth are WNL. No peripheral edema or cyanosis. No masses, redness, swelling, asymmetry, or associated skin lesions. No contractures.  Skin & Extremity Inspection: Skin color, temperature, and hair growth are WNL. No peripheral edema or cyanosis. No masses, redness, swelling, asymmetry, or associated skin lesions. No contractures.  Functional ROM: Unrestricted ROM          Functional ROM: Unrestricted ROM          Muscle Tone/Strength: Functionally intact. No obvious neuro-muscular anomalies detected.  Muscle Tone/Strength: Functionally intact. No obvious neuro-muscular anomalies detected.  Sensory (Neurological): Unimpaired  Sensory (Neurological): Unimpaired  Palpation: No palpable anomalies  Palpation: No palpable anomalies   Assessment  Primary Diagnosis & Pertinent Problem List: The primary encounter diagnosis was Lumbar spondylosis. Diagnoses of Lumbar facet syndrome (Location of Primary Source of Pain) (Bilateral) (L>R), Chronic low back pain (Location of Primary Source of Pain) (Bilateral) (L>R), and Chronic pain syndrome were also pertinent to this visit.  Status Diagnosis  Worsening Worsening Worsening 1. Lumbar spondylosis   2. Lumbar facet syndrome (Location of Primary Source of Pain) (Bilateral) (L>R)   3. Chronic low back pain (Location of Primary Source of Pain) (Bilateral) (L>R)   4. Chronic pain syndrome     Problems updated and reviewed during this visit: No problems updated. Plan of Care  Pharmacotherapy (Medications Ordered): Meds ordered this encounter  Medications  . Oxycodone HCl 10 MG TABS    Sig: Take 1 tablet (10 mg total) by mouth every 6 (six) hours as needed.    Dispense:  120 tablet    Refill:  0    Do not place this  medication, or any other prescription from our practice, on "Automatic Refill". Patient may have prescription filled one day early if pharmacy is closed on scheduled refill date. Do not fill until:12/25/2016 To last until: 01/24/2017    Order Specific Question:   Supervising Provider    Answer:   [name] [phone]  . Oxycodone HCl 10 MG TABS    Sig: Take 1 tablet (10 mg total) by mouth every 6 (six) hours as needed.    Dispense:  120 tablet    Refill:  0    Do not place this medication, or any other prescription from our practice, on "Automatic Refill". Patient may have prescription filled one day early if pharmacy is closed on scheduled refill date. Do not fill until: 10/26/2016 To last until: 11/25/2016    Order Specific Question:   Supervising Provider    Answer:   [name] [phone]  . Oxycodone HCl 10 MG TABS    Sig: Take 1 tablet (10 mg total) by mouth every 6 (six) hours as needed.    Dispense:  120 tablet    Refill:  0    Do not place this medication, or any other prescription from our practice, on "Automatic Refill". Patient may have prescription filled one day early if pharmacy is closed on scheduled refill date. Do not fill until: 11/25/2016 To last until:12/25/2016    Order Specific Question:   Supervising Provider    Answer:   [name] [phone]  . NONFORMULARY OR COMPOUNDED ITEM    Sig: 10% Ketamine/2% Cyclobenzaprine/6% Gabapentin Cream Sig: 1-2 ml to affected area 3-4 times/day. Amount: 240 GM    Dispense:  240 each    Refill:  2    Order Specific Question:   Supervising Provider    AnswerMilinda Pointer [phone]   New Prescriptions   No medications on file   Medications administered today: Kristopher [name] had no medications administered during this visit. Lab-work, procedure(s), and/or referral(s): No orders of the defined types were placed in this encounter.  Imaging and/or referral(s): None  Interventional therapies: Planned,  scheduled, and/or pending:   Not at this time.Secondary to allergies   Considering:   None at this time   Palliative PRN treatment(s):   None at this time   Provider-requested follow-up: Return in about 3 months (around 01/10/2017) for MedMgmt.  Future Appointments Date Time Provider [place]  01/08/2017 10:45 AM Vevelyn Francois, NP [facility] None  02/12/2017 9:30 AM [name], DO [facility] None   Primary Care Physician: [name], DO Location: [facility] Note by: Vevelyn Francois NP Date: 10/10/2016; Time: 12:58 PM  Pain Score Disclaimer: We use the NRS-11 scale. This is a self-reported, subjective measurement of pain severity with only modest accuracy. It is used primarily to identify changes within a particular patient. It must be understood that outpatient pain scales are significantly less accurate that those used for research, where they can be applied under ideal controlled circumstances with minimal exposure to variables. In reality, the score is likely to be a combination of pain intensity and pain affect, where pain affect describes the degree of emotional arousal or changes in action readiness caused by the sensory experience of pain. Factors such as social and work situation, setting, emotional state, anxiety levels, expectation, and prior pain experience may influence pain perception and show large inter-individual differences that may also be affected by time variables.  Patient instructions provided during this appointment: Patient Instructions    ____________________________________________________________________________________________  Medication Rules  Applies to: All patients receiving prescriptions (written or electronic).  Pharmacy of record: Pharmacy where electronic prescriptions will be sent. If written prescriptions are taken to a different pharmacy, please inform the nursing staff. The pharmacy listed in the  electronic medical record should be the one where you would like electronic prescriptions to be sent.  Prescription refills: Only during scheduled appointments. Applies to both, written and electronic prescriptions.  NOTE: The following  applies primarily to controlled substances (Opioid* Pain Medications).   Patient's responsibilities: 1. Pain Pills: Bring all pain pills to every appointment (except for procedure appointments). 2. Pill Bottles: Bring pills in original pharmacy bottle. Always bring newest bottle. Bring bottle, even if empty. 3. Medication refills: You are responsible for knowing and keeping track of what medications you need refilled. The day before your appointment, write a list of all prescriptions that need to be refilled. Bring that list to your appointment and give it to the admitting nurse. Prescriptions will be written only during appointments. If you forget a medication, it will not be "Called in", "Faxed", or "electronically sent". You will need to get another appointment to get these prescribed. 4. Prescription Accuracy: You are responsible for carefully inspecting your prescriptions before leaving our office. Have the discharge nurse carefully go over each prescription with you, before taking them home. Make sure that your name is accurately spelled, that your address is correct. Check the name and dose of your medication to make sure it is accurate. Check the number of pills, and the written instructions to make sure they are clear and accurate. Make sure that you are given enough medication to last until your next medication refill appointment. 5. Taking Medication: Take medication as prescribed. Never take more pills than instructed. Never take medication more frequently than prescribed. Taking less pills or less frequently is permitted and encouraged, when it comes to controlled substances (written prescriptions).  6. Inform other Doctors: Always inform, all of your  healthcare providers, of all the medications you take. 7. Pain Medication from other Providers: You are not allowed to accept any additional pain medication from any other Doctor or Healthcare provider. There are two exceptions to this rule. (see below) In the event that you require additional pain medication, you are responsible for notifying us, as stated below. 8. Medication Agreement: You are responsible for carefully reading and following our Medication Agreement. This must be signed before receiving any prescriptions from our practice. Safely store a copy of your signed Agreement. Violations to the Agreement will result in no further prescriptions. (Additional copies of our Medication Agreement are available upon request.) 9. Laws, Rules, & Regulations: All patients are expected to follow all Federal and [employer], [employer], Rules, [employer] [facility]. Ignorance of the Laws does not constitute a valid excuse. The use of any illegal substances is prohibited. 10. Adopted CDC guidelines & recommendations: Target dosing levels will be at or below 60 MME/day. Use of benzodiazepines** is not recommended.  Exceptions: There are only two exceptions to the rule of not receiving pain medications from other Healthcare Providers. 1. Exception #1 (Emergencies): In the event of an emergency (i.e.: accident requiring emergency care), you are allowed to receive additional pain medication. However, you are responsible for: As soon as you are able, call our office [phone], at any time of the day or night, and leave a message stating your name, the date and nature of the emergency, and the name and dose of the medication prescribed. In the event that your call is answered by a member of our staff, make sure to document and save the date, time, and the name of the person that took your information.  2. Exception #2 (Planned Surgery): In the event that you are scheduled by another doctor or dentist to have any type of  surgery or procedure, you are allowed (for a period no longer than 30 days), to receive additional pain medication, for the  acute post-op pain. However, in this case, you are responsible for picking up a copy of our "Post-op Pain Management for Surgeons" handout, and giving it to your surgeon or dentist. This document is available at our office, and does not require an appointment to obtain it. Simply go to our office during business hours (Monday-Thursday from 8:00 AM to 4:00 PM) (Friday 8:00 AM to 12:00 Noon) or if you have a scheduled appointment with [place], prior to your surgery, and ask for it by name. In addition, you will need to provide [place] with your name, name of your surgeon, type of surgery, and date of procedure or surgery.  *Opioid medications include: morphine, codeine, oxycodone, oxymorphone, hydrocodone, hydromorphone, meperidine, tramadol, tapentadol, buprenorphine, fentanyl, methadone. **Benzodiazepine medications include: diazepam (Valium), alprazolam (Xanax), clonazepam (Klonopine), lorazepam (Ativan), clorazepate (Tranxene), chlordiazepoxide (Librium), estazolam (Prosom), oxazepam (Serax), temazepam (Restoril), triazolam (Halcion)  ____________________________________________________________________________________________ ____________________________________________________________________________________________  Pain Scale  Introduction: The pain score used by this practice is the Verbal Numerical Rating Scale (VNRS-11). This is an 11-point scale. It is for adults and children 10 years or older. There are significant differences in how the pain score is reported, used, and applied. Forget everything you learned in the past and learn this scoring system.  General Information: The scale should reflect your current level of pain. Unless you are specifically asked for the level of your worst pain, or your average pain. If you are asked for one of these two, then it should be understood  that it is over the past 24 hours.  Basic Activities of Daily Living (ADL): Personal hygiene, dressing, eating, transferring, and using restroom.  Instructions: Most patients tend to report their level of pain as a combination of two factors, their physical pain and their psychosocial pain. This last one is also known as "suffering" and it is reflection of how physical pain affects you socially and psychologically. From now on, report them separately. From this point on, when asked to report your pain level, report only your physical pain. Use the following table for reference.  Pain Clinic Pain Levels (0-5/10)  Pain Level Score  Description  No Pain 0   Mild pain 1 Nagging, annoying, but does not interfere with basic activities of daily living (ADL). Patients are able to eat, bathe, get dressed, toileting (being able to get on and off the toilet and perform personal hygiene functions), transfer (move in and out of bed or a chair without assistance), and maintain continence (able to control bladder and bowel functions). Blood pressure and heart rate are unaffected. A normal heart rate for a healthy adult ranges from 60 to 100 bpm (beats per minute).   Mild to moderate pain 2 Noticeable and distracting. Impossible to hide from other people. More frequent flare-ups. Still possible to adapt and function close to normal. It can be very annoying and may have occasional stronger flare-ups. With discipline, patients may get used to it and adapt.   Moderate pain 3 Interferes significantly with activities of daily living (ADL). It becomes difficult to feed, bathe, get dressed, get on and off the toilet or to perform personal hygiene functions. Difficult to get in and out of bed or a chair without assistance. Very distracting. With effort, it can be ignored when deeply involved in activities.   Moderately severe pain 4 Impossible to ignore for more than a few minutes. With effort, patients may still be able to  manage work or participate in some social activities. Very difficult to concentrate. Signs  of autonomic nervous system discharge are evident: dilated pupils (mydriasis); mild sweating (diaphoresis); sleep interference. Heart rate becomes elevated (>115 bpm). Diastolic blood pressure (lower number) rises above 100 mmHg. Patients find relief in laying down and not moving.   Severe pain 5 Intense and extremely unpleasant. Associated with frowning face and frequent crying. Pain overwhelms the senses.  Ability to do any activity or maintain social relationships becomes significantly limited. Conversation becomes difficult. Pacing back and forth is common, as getting into a comfortable position is nearly impossible. Pain wakes you up from deep sleep. Physical signs will be obvious: pupillary dilation; increased sweating; goosebumps; brisk reflexes; cold, clammy hands and feet; nausea, vomiting or dry heaves; loss of appetite; significant sleep disturbance with inability to fall asleep or to remain asleep. When persistent, significant weight loss is observed due to the complete loss of appetite and sleep deprivation.  Blood pressure and heart rate becomes significantly elevated. Caution: If elevated blood pressure triggers a pounding headache associated with blurred vision, then the patient should immediately seek attention at an urgent or emergency care unit, as these may be signs of an impending stroke.    Emergency Department Pain Levels (6-10/10)  Emergency Room Pain 6 Severely limiting. Requires emergency care and should not be seen or managed at an outpatient pain management facility. Communication becomes difficult and requires great effort. Assistance to reach the emergency department may be required. Facial flushing and profuse sweating along with potentially dangerous increases in heart rate and blood pressure will be evident.   Distressing pain 7 Self-care is very difficult. Assistance is required to  transport, or use restroom. Assistance to reach the emergency department will be required. Tasks requiring coordination, such as bathing and getting dressed become very difficult.   Disabling pain 8 Self-care is no longer possible. At this level, pain is disabling. The individual is unable to do even the most "basic" activities such as walking, eating, bathing, dressing, transferring to a bed, or toileting. Fine motor skills are lost. It is difficult to think clearly.   Incapacitating pain 9 Pain becomes incapacitating. Thought processing is no longer possible. Difficult to remember your own name. Control of movement and coordination are lost.   The worst pain imaginable 10 At this level, most patients pass out from pain. When this level is reached, collapse of the autonomic nervous system occurs, leading to a sudden drop in blood pressure and heart rate. This in turn results in a temporary and dramatic drop in blood flow to the brain, leading to a loss of consciousness. Fainting is one of the body's self defense mechanisms. Passing out puts the brain in a calmed state and causes it to shut down for a while, in order to begin the healing process.    Summary: 1. Refer to this scale when providing [place] with your pain level. 2. Be accurate and careful when reporting your pain level. This will help with your care. 3. Over-reporting your pain level will lead to loss of credibility. 4. Even a level of 1/10 means that there is pain and will be treated at our facility. 5. High, inaccurate reporting will be documented as "Symptom Exaggeration", leading to loss of credibility and suspicions of possible secondary gains such as obtaining more narcotics, or wanting to appear disabled, for fraudulent reasons. 6. Only pain levels of 5 or below will be seen at our facility. 7. Pain levels of 6 and above will be sent to the Emergency Department and the appointment cancelled.  You were given 3 prescriptions for  Oxycodone and one for compounding cream today. ____________________________________________________________________________________________

## 2016-11-26 ENCOUNTER — Other Ambulatory Visit: Payer: Self-pay | Admitting: Neurology

## 2016-11-27 ENCOUNTER — Telehealth: Payer: Self-pay | Admitting: Family Medicine

## 2016-11-27 MED ORDER — SUMATRIPTAN SUCCINATE 100 MG PO TABS: ORAL_TABLET | ORAL | 12 refills | Status: DC

## 2016-11-27 NOTE — Telephone Encounter (Signed)
Patient is out of his headache med Sumatriptan (Imitrex) sent to [facility]  If this cannot be refilled he will need an appointment since he is out of the medication.  Thanks  [place] [phone]

## 2016-11-27 NOTE — Telephone Encounter (Signed)
Rx to his pharmacy

## 2016-11-27 NOTE — Telephone Encounter (Signed)
Routing to provider  

## 2016-11-29 ENCOUNTER — Telehealth: Payer: Self-pay | Admitting: Family Medicine

## 2016-11-29 NOTE — Telephone Encounter (Signed)
Patient is getting ready to go out of town for a week and would like something sent to [facility] if possible.  He has developed congestion.  I told her I would just send this message and someone would let them know.  Please advise  Thanks  [phone]

## 2016-11-29 NOTE — Telephone Encounter (Signed)
Patient needs an appointment

## 2016-12-02 ENCOUNTER — Encounter: Payer: Self-pay | Admitting: Family Medicine

## 2016-12-02 ENCOUNTER — Ambulatory Visit (INDEPENDENT_AMBULATORY_CARE_PROVIDER_SITE_OTHER): Payer: Medicare Other | Admitting: Family Medicine

## 2016-12-02 VITALS — BP 153/93 | HR 80 | Temp 98.7°F | Wt 223.0 lb

## 2016-12-02 DIAGNOSIS — J069 Acute upper respiratory infection, unspecified: Secondary | ICD-10-CM

## 2016-12-02 MED ORDER — AZITHROMYCIN 250 MG PO TABS: ORAL_TABLET | ORAL | 0 refills | Status: DC

## 2016-12-02 MED ORDER — ALBUTEROL SULFATE HFA 108 (90 BASE) MCG/ACT IN AERS: 2.0000 | INHALATION_SPRAY | Freq: Four times a day (QID) | RESPIRATORY_TRACT | 2 refills | Status: DC | PRN

## 2016-12-02 MED ORDER — PREDNISONE 10 MG PO TABS: ORAL_TABLET | ORAL | 0 refills | Status: DC

## 2016-12-02 MED ORDER — DOXYCYCLINE HYCLATE 100 MG PO TABS
100.0000 mg | ORAL_TABLET | Freq: Two times a day (BID) | ORAL | 0 refills | Status: DC
Start: 2016-12-02 — End: 2017-03-31

## 2016-12-02 NOTE — Progress Notes (Signed)
   BP (!) 153/93   Pulse 80   Temp 98.7 F (37.1 C)   Wt 223 lb (101.2 kg)   SpO2 97%   BMI 32.00 kg/m    Subjective:    Patient ID: Kristopher [name], male    DOB: 1960/12/19, 56 y.o.   MRN: [id number]  HPI: Kristopher [name] is a 56 y.o. male  Chief Complaint  Patient presents with  . URI    x 5 days, head/chest congestion, productive cough, raw throat, sinus drainage, h/a. No fever. No ear ache.   Patient presents with 5 day hx of congestion, productive cough, sore throat, ear pressure, sinus pressure, significant wheezing and chest tightness. Trying theraflu and cough drops with no relief. Grandchild has been sick lately. Does note long time smoking hx. Recent hx of pneumonia (03/2016). No known dx of pulmonary dz though spirometry s/p pneumonia showed restriction - unclear whether underlying condition or from dz process.   Relevant past medical, surgical, family and social history reviewed and updated as indicated. Interim medical history since our last visit reviewed. Allergies and medications reviewed and updated.  Review of Systems  Per HPI unless specifically indicated above     Objective:    BP (!) 153/93   Pulse 80   Temp 98.7 F (37.1 C)   Wt 223 lb (101.2 kg)   SpO2 97%   BMI 32.00 kg/m   Wt Readings from Last 3 Encounters:  12/02/16 223 lb (101.2 kg)  10/10/16 200 lb (90.7 kg)  08/12/16 224 lb 11.2 oz (101.9 kg)    Physical Exam  Constitutional: He is oriented to person, place, and time. He appears well-developed and well-nourished. No distress.  HENT:  Head: Atraumatic.  Right Ear: External ear normal.  Left Ear: External ear normal.  Oropharynx injected B/l TMs tortuous, no evidence of infection   Eyes: Pupils are equal, round, and reactive to light. Conjunctivae are normal.  Neck: Normal range of motion. Neck supple.  Cardiovascular: Normal rate and normal heart sounds.   Pulmonary/Chest: Effort normal. No respiratory distress. He has wheezes  (moderate, b/l). He has no rales.  Musculoskeletal: Normal range of motion.  Lymphadenopathy:    He has no cervical adenopathy.  Neurological: He is alert and oriented to person, place, and time.  Skin: Skin is warm and dry.  Psychiatric: He has a normal mood and affect. His behavior is normal.  Nursing note and vitals reviewed.     Assessment & Plan:   Problem List Items Addressed This Visit    None    Visit Diagnoses    Upper respiratory tract infection, unspecified type    -  Primary   With significant wheezing and chest congestion. Pt wanting to hold off on CXR. Start doxycycline, prednisone, albuterol, and delsym. F/u if no improvement       Follow up plan: Return in about 3 months (around 03/04/2017) for BP f/u, possible repeat spiro.

## 2016-12-04 NOTE — Patient Instructions (Addendum)
Follow up as scheduled.  

## 2017-01-08 ENCOUNTER — Encounter: Payer: Self-pay | Admitting: Nurse Practitioner

## 2017-01-08 ENCOUNTER — Ambulatory Visit: Payer: Medicare Other | Attending: Nurse Practitioner | Admitting: Nurse Practitioner

## 2017-01-08 VITALS — BP 159/99 | HR 84 | Temp 98.1°F | Resp 16 | Ht 70.0 in | Wt 220.0 lb

## 2017-01-08 DIAGNOSIS — Z79899 Other long term (current) drug therapy: Secondary | ICD-10-CM | POA: Diagnosis not present

## 2017-01-08 DIAGNOSIS — Z79891 Long term (current) use of opiate analgesic: Secondary | ICD-10-CM | POA: Diagnosis not present

## 2017-01-08 DIAGNOSIS — M5442 Lumbago with sciatica, left side: Secondary | ICD-10-CM | POA: Diagnosis not present

## 2017-01-08 DIAGNOSIS — Z1211 Encounter for screening for malignant neoplasm of colon: Secondary | ICD-10-CM | POA: Insufficient documentation

## 2017-01-08 DIAGNOSIS — Z833 Family history of diabetes mellitus: Secondary | ICD-10-CM | POA: Insufficient documentation

## 2017-01-08 DIAGNOSIS — K621 Rectal polyp: Secondary | ICD-10-CM | POA: Diagnosis not present

## 2017-01-08 DIAGNOSIS — Z886 Allergy status to analgesic agent status: Secondary | ICD-10-CM | POA: Insufficient documentation

## 2017-01-08 DIAGNOSIS — M5441 Lumbago with sciatica, right side: Secondary | ICD-10-CM | POA: Diagnosis not present

## 2017-01-08 DIAGNOSIS — Z880 Allergy status to penicillin: Secondary | ICD-10-CM | POA: Diagnosis not present

## 2017-01-08 DIAGNOSIS — D125 Benign neoplasm of sigmoid colon: Secondary | ICD-10-CM | POA: Insufficient documentation

## 2017-01-08 DIAGNOSIS — M4802 Spinal stenosis, cervical region: Secondary | ICD-10-CM | POA: Diagnosis not present

## 2017-01-08 DIAGNOSIS — R51 Headache: Secondary | ICD-10-CM | POA: Diagnosis not present

## 2017-01-08 DIAGNOSIS — G894 Chronic pain syndrome: Secondary | ICD-10-CM | POA: Diagnosis not present

## 2017-01-08 DIAGNOSIS — E876 Hypokalemia: Secondary | ICD-10-CM | POA: Diagnosis not present

## 2017-01-08 DIAGNOSIS — Z9889 Other specified postprocedural states: Secondary | ICD-10-CM | POA: Insufficient documentation

## 2017-01-08 DIAGNOSIS — M25552 Pain in left hip: Secondary | ICD-10-CM | POA: Diagnosis not present

## 2017-01-08 DIAGNOSIS — Z809 Family history of malignant neoplasm, unspecified: Secondary | ICD-10-CM | POA: Diagnosis not present

## 2017-01-08 DIAGNOSIS — F17210 Nicotine dependence, cigarettes, uncomplicated: Secondary | ICD-10-CM | POA: Diagnosis not present

## 2017-01-08 DIAGNOSIS — I10 Essential (primary) hypertension: Secondary | ICD-10-CM | POA: Diagnosis not present

## 2017-01-08 DIAGNOSIS — K219 Gastro-esophageal reflux disease without esophagitis: Secondary | ICD-10-CM | POA: Insufficient documentation

## 2017-01-08 DIAGNOSIS — M5412 Radiculopathy, cervical region: Secondary | ICD-10-CM | POA: Diagnosis not present

## 2017-01-08 DIAGNOSIS — Z888 Allergy status to other drugs, medicaments and biological substances status: Secondary | ICD-10-CM | POA: Insufficient documentation

## 2017-01-08 DIAGNOSIS — G8929 Other chronic pain: Secondary | ICD-10-CM | POA: Diagnosis not present

## 2017-01-08 DIAGNOSIS — D122 Benign neoplasm of ascending colon: Secondary | ICD-10-CM | POA: Diagnosis not present

## 2017-01-08 MED ORDER — OXYCODONE HCL 10 MG PO TABS: 10.0000 mg | ORAL_TABLET | Freq: Four times a day (QID) | ORAL | 0 refills | Status: DC | PRN

## 2017-01-08 NOTE — Patient Instructions (Addendum)
____________________________________________________________________________________________  Medication Rules  Applies to: All patients receiving prescriptions (written or electronic).  Pharmacy of record: Pharmacy where electronic prescriptions will be sent. If written prescriptions are taken to a different pharmacy, please inform the nursing staff. The pharmacy listed in the electronic medical record should be the one where you would like electronic prescriptions to be sent.  Prescription refills: Only during scheduled appointments. Applies to both, written and electronic prescriptions.  NOTE: The following applies primarily to controlled substances (Opioid* Pain Medications).   Patient's responsibilities: 1. Pain Pills: Bring all pain pills to every appointment (except for procedure appointments). 2. Pill Bottles: Bring pills in original pharmacy bottle. Always bring newest bottle. Bring bottle, even if empty. 3. Medication refills: You are responsible for knowing and keeping track of what medications you need refilled. The day before your appointment, write a list of all prescriptions that need to be refilled. Bring that list to your appointment and give it to the admitting nurse. Prescriptions will be written only during appointments. If you forget a medication, it will not be "Called in", "Faxed", or "electronically sent". You will need to get another appointment to get these prescribed. 4. Prescription Accuracy: You are responsible for carefully inspecting your prescriptions before leaving our office. Have the discharge nurse carefully go over each prescription with you, before taking them home. Make sure that your name is accurately spelled, that your address is correct. Check the name and dose of your medication to make sure it is accurate. Check the number of pills, and the written instructions to make sure they are clear and accurate. Make sure that you are given enough medication to  last until your next medication refill appointment. 5. Taking Medication: Take medication as prescribed. Never take more pills than instructed. Never take medication more frequently than prescribed. Taking less pills or less frequently is permitted and encouraged, when it comes to controlled substances (written prescriptions).  6. Inform other Doctors: Always inform, all of your healthcare providers, of all the medications you take. 7. Pain Medication from other Providers: You are not allowed to accept any additional pain medication from any other Doctor or Healthcare provider. There are two exceptions to this rule. (see below) In the event that you require additional pain medication, you are responsible for notifying us, as stated below. 8. Medication Agreement: You are responsible for carefully reading and following our Medication Agreement. This must be signed before receiving any prescriptions from our practice. Safely store a copy of your signed Agreement. Violations to the Agreement will result in no further prescriptions. (Additional copies of our Medication Agreement are available upon request.) 9. Laws, Rules, & Regulations: All patients are expected to follow all Federal and State Laws, Statutes, Rules, & Regulations. Ignorance of the Laws does not constitute a valid excuse. The use of any illegal substances is prohibited. 10. Adopted CDC guidelines & recommendations: Target dosing levels will be at or below 60 MME/day. Use of benzodiazepines** is not recommended.  Exceptions: There are only two exceptions to the rule of not receiving pain medications from other Healthcare Providers. 1. Exception #1 (Emergencies): In the event of an emergency (i.e.: accident requiring emergency care), you are allowed to receive additional pain medication. However, you are responsible for: As soon as you are able, call our office [phone], at any time of the day or night, and leave a message stating your  name, the date and nature of the emergency, and the name and dose of the medication   prescribed. In the event that your call is answered by a member of our staff, make sure to document and save the date, time, and the name of the person that took your information.  2. Exception #2 (Planned Surgery): In the event that you are scheduled by another doctor or dentist to have any type of surgery or procedure, you are allowed (for a period no longer than 30 days), to receive additional pain medication, for the acute post-op pain. However, in this case, you are responsible for picking up a copy of our "Post-op Pain Management for Surgeons" handout, and giving it to your surgeon or dentist. This document is available at our office, and does not require an appointment to obtain it. Simply go to our office during business hours (Monday-Thursday from 8:00 AM to 4:00 PM) (Friday 8:00 AM to 12:00 Noon) or if you have a scheduled appointment with [place], prior to your surgery, and ask for it by name. In addition, you will need to provide [place] with your name, name of your surgeon, type of surgery, and date of procedure or surgery.  *Opioid medications include: morphine, codeine, oxycodone, oxymorphone, hydrocodone, hydromorphone, meperidine, tramadol, tapentadol, buprenorphine, fentanyl, methadone. **Benzodiazepine medications include: diazepam (Valium), alprazolam (Xanax), clonazepam (Klonopine), lorazepam (Ativan), clorazepate (Tranxene), chlordiazepoxide (Librium), estazolam (Prosom), oxazepam (Serax), temazepam (Restoril), triazolam (Halcion)  ____________________________________________________________________________________________  ____________________________________________________________________________________________  Pain Scale  Introduction: The pain score used by this practice is the Verbal Numerical Rating Scale (VNRS-11). This is an 11-point scale. It is for adults and children 10 years or older. There are  significant differences in how the pain score is reported, used, and applied. Forget everything you learned in the past and learn this scoring system.  General Information: The scale should reflect your current level of pain. Unless you are specifically asked for the level of your worst pain, or your average pain. If you are asked for one of these two, then it should be understood that it is over the past 24 hours.  Basic Activities of Daily Living (ADL): Personal hygiene, dressing, eating, transferring, and using restroom.  Instructions: Most patients tend to report their level of pain as a combination of two factors, their physical pain and their psychosocial pain. This last one is also known as "suffering" and it is reflection of how physical pain affects you socially and psychologically. From now on, report them separately. From this point on, when asked to report your pain level, report only your physical pain. Use the following table for reference.  Pain Clinic Pain Levels (0-5/10)  Pain Level Score  Description  No Pain 0   Mild pain 1 Nagging, annoying, but does not interfere with basic activities of daily living (ADL). Patients are able to eat, bathe, get dressed, toileting (being able to get on and off the toilet and perform personal hygiene functions), transfer (move in and out of bed or a chair without assistance), and maintain continence (able to control bladder and bowel functions). Blood pressure and heart rate are unaffected. A normal heart rate for a healthy adult ranges from 60 to 100 bpm (beats per minute).   Mild to moderate pain 2 Noticeable and distracting. Impossible to hide from other people. More frequent flare-ups. Still possible to adapt and function close to normal. It can be very annoying and may have occasional stronger flare-ups. With discipline, patients may get used to it and adapt.   Moderate pain 3 Interferes significantly with activities of daily living (ADL). It  becomes difficult  to feed, bathe, get dressed, get on and off the toilet or to perform personal hygiene functions. Difficult to get in and out of bed or a chair without assistance. Very distracting. With effort, it can be ignored when deeply involved in activities.   Moderately severe pain 4 Impossible to ignore for more than a few minutes. With effort, patients may still be able to manage work or participate in some social activities. Very difficult to concentrate. Signs of autonomic nervous system discharge are evident: dilated pupils (mydriasis); mild sweating (diaphoresis); sleep interference. Heart rate becomes elevated (>115 bpm). Diastolic blood pressure (lower number) rises above 100 mmHg. Patients find relief in laying down and not moving.   Severe pain 5 Intense and extremely unpleasant. Associated with frowning face and frequent crying. Pain overwhelms the senses.  Ability to do any activity or maintain social relationships becomes significantly limited. Conversation becomes difficult. Pacing back and forth is common, as getting into a comfortable position is nearly impossible. Pain wakes you up from deep sleep. Physical signs will be obvious: pupillary dilation; increased sweating; goosebumps; brisk reflexes; cold, clammy hands and feet; nausea, vomiting or dry heaves; loss of appetite; significant sleep disturbance with inability to fall asleep or to remain asleep. When persistent, significant weight loss is observed due to the complete loss of appetite and sleep deprivation.  Blood pressure and heart rate becomes significantly elevated. Caution: If elevated blood pressure triggers a pounding headache associated with blurred vision, then the patient should immediately seek attention at an urgent or emergency care unit, as these may be signs of an impending stroke.    Emergency Department Pain Levels (6-10/10)  Emergency Room Pain 6 Severely limiting. Requires emergency care and should not be  seen or managed at an outpatient pain management facility. Communication becomes difficult and requires great effort. Assistance to reach the emergency department may be required. Facial flushing and profuse sweating along with potentially dangerous increases in heart rate and blood pressure will be evident.   Distressing pain 7 Self-care is very difficult. Assistance is required to transport, or use restroom. Assistance to reach the emergency department will be required. Tasks requiring coordination, such as bathing and getting dressed become very difficult.   Disabling pain 8 Self-care is no longer possible. At this level, pain is disabling. The individual is unable to do even the most "basic" activities such as walking, eating, bathing, dressing, transferring to a bed, or toileting. Fine motor skills are lost. It is difficult to think clearly.   Incapacitating pain 9 Pain becomes incapacitating. Thought processing is no longer possible. Difficult to remember your own name. Control of movement and coordination are lost.   The worst pain imaginable 10 At this level, most patients pass out from pain. When this level is reached, collapse of the autonomic nervous system occurs, leading to a sudden drop in blood pressure and heart rate. This in turn results in a temporary and dramatic drop in blood flow to the brain, leading to a loss of consciousness. Fainting is one of the body's self defense mechanisms. Passing out puts the brain in a calmed state and causes it to shut down for a while, in order to begin the healing process.    Summary: 1. Refer to this scale when providing [place] with your pain level. 2. Be accurate and careful when reporting your pain level. This will help with your care. 3. Over-reporting your pain level will lead to loss of credibility. 4. Even a level of  1/10 means that there is pain and will be treated at our facility. 5. High, inaccurate reporting will be documented as "Symptom  Exaggeration", leading to loss of credibility and suspicions of possible secondary gains such as obtaining more narcotics, or wanting to appear disabled, for fraudulent reasons. 6. Only pain levels of 5 or below will be seen at our facility. 7. Pain levels of 6 and above will be sent to the Emergency Department and the appointment cancelled. ____________________________________________________________________________________________    BMI Assessment: Estimated body mass index is 31.57 kg/m as calculated from the following:   Height as of this encounter: 5\' 10"  (1.778 m).   Weight as of this encounter: 220 lb (99.8 kg).  BMI interpretation table: BMI level Category Range association with higher incidence of chronic pain  <18 kg/m2 Underweight   18.5-24.9 kg/m2 Ideal body weight   25-29.9 kg/m2 Overweight Increased incidence by 20%  30-34.9 kg/m2 Obese (Class I) Increased incidence by 68%  35-39.9 kg/m2 Severe obesity (Class II) Increased incidence by 136%  >40 kg/m2 Extreme obesity (Class III) Increased incidence by 254%   BMI Readings from Last 4 Encounters:  01/08/17 31.57 kg/m  12/02/16 32.00 kg/m  10/10/16 28.70 kg/m  08/12/16 32.24 kg/m   Wt Readings from Last 4 Encounters:  01/08/17 220 lb (99.8 kg)  12/02/16 223 lb (101.2 kg)  10/10/16 200 lb (90.7 kg)  08/12/16 224 lb 11.2 oz (101.9 kg)

## 2017-01-08 NOTE — Progress Notes (Signed)
Nursing Pain Medication Assessment:  Safety precautions to be maintained throughout the outpatient stay will include: orient to surroundings, keep bed in low position, maintain call bell within reach at all times, provide assistance with transfer out of bed and ambulation.  Medication Inspection Compliance: Pill count conducted under aseptic conditions, in front of the patient. Neither the pills nor the bottle was removed from the patient's sight at any time. Once count was completed pills were immediately returned to the patient in their original bottle.  Medication: Oxycodone IR Pill/Patch Count: 104 of 120 pills remain Pill/Patch Appearance: Markings consistent with prescribed medication Bottle Appearance: Standard pharmacy container. Clearly labeled. Filled Date: 39 / 2 / 2018 Last Medication intake:  Today

## 2017-01-08 NOTE — Progress Notes (Signed)
Patient's Name: Kristopher [name]  MRN: [id number]  Referring Provider: [name], DO  DOB: January 21, 1961  PCP: [name], DO  DOS: 01/08/2017  Note by: Vevelyn Francois NP  Service setting: Ambulatory outpatient  Specialty: Interventional Pain Management  Location: [facility] (AMB) Pain Management Facility    Patient type: Established    Primary Reason(s) for Visit: Encounter for prescription drug management. (Level of risk: moderate)  CC: Back Pain (lower left); Leg Pain (left); and Neck Pain (bilateral)  HPI  Kristopher [name] is a 56 y.o. year old, male patient, who comes today for a medication management evaluation. He has Chronic low back pain (Location of Primary Source of Pain) (Bilateral) (L>R); Lumbar spondylosis; Chronic lumbar radicular pain (Location of Secondary source of pain) (S1 dermatomal) (Left); 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); Chronic neck pain (posterior midline) (Bilateral) (L>R); Cervical spondylosis; Chronic cervical radicular pain (Location of Tertiary source of pain) (Bilateral) (C5/C6 dermatome) (L>R); Long term current use of opiate analgesic; Long term prescription opiate use; Opiate use (60 MME/Day); Opioid dependence ([place]); Encounter for therapeutic drug level monitoring; Cervical spinal stenosis (C4-5); Cervical foraminal stenosis (Bilateral C5-6); Hypokalemia; Retrolisthesis of L5-S1; Cervical disc herniation (C4-5 and C5-6); Lumbar disc herniation (L5-S1); Chronic sacroiliac joint pain (Bilateral) (L>R); Allergy history, anesthetic (Unconfirmed allergy to Lidocaine); Chronic hip pain (Left); Lumbar facet syndrome (Location of Primary Source of Pain) (Bilateral) (L>R); Chronic lower extremity pain (Location of Secondary source of pain) (Left); Chronic upper extremity pain (Location of Tertiary source of pain) (Bilateral) (L>R); Greater occipital neuralgia (Right); Chronic pain syndrome; Benign hypertensive renal disease; Migraine; Special  screening for malignant neoplasms, colon; Benign neoplasm of ascending colon; Polyp of sigmoid colon; Rectal polyp; Tobacco abuse; and GERD (gastroesophageal reflux disease) on their problem list. His primarily concern today is the Back Pain (lower left); Leg Pain (left); and Neck Pain (bilateral)  Pain Assessment: Location: Lower, Left Back(left leg and neck) Radiating: down the left leg and also causing headaches Onset: More than a month ago Duration: Chronic pain Quality: Pounding, Stabbing Severity: 6 /10 (self-reported pain score)  Note: Reported level is compatible with observation. Clinically the patient looks like a 2/10       Information on the proper use of the pain scale provided to the patient today. When using our objective Pain Scale, levels between 6 and 10/10 are said to belong in an emergency room, as it progressively worsens from a 6/10, described as severely limiting, requiring emergency care not usually available at an outpatient pain management facility. At a 6/10 level, communication becomes difficult and requires great effort. Assistance to reach the emergency department may be required. Facial flushing and profuse sweating along with potentially dangerous increases in heart rate and blood pressure will be evident. Effect on ADL: patient states that he stays in the bed or on the couch when pain flares  Timing: Intermittent Modifying factors: medications help achieve day to day  Kristopher [name] was last scheduled for an appointment on 10/10/2016 for medication management. During today's appointment we reviewed Kristopher [name] chronic pain status, as well as his outpatient medication regimen. He states that he has some weakness. He admits that this is not often but it does happen. He states that he occasionally drops things. He feels like the back pain is getting worse. He states that he gets stiffness in his hip and legs. He does stumble a lot. He feels like the weather is effecting his  pain.   The patient  reports that he does not use drugs. His body mass index is 31.57 kg/m.  Further details on both, my assessment(s), as well as the proposed treatment plan, please see below.  Controlled Substance Pharmacotherapy Assessment REMS (Risk Evaluation and Mitigation Strategy)  Analgesic:Oxycodone IR 10 mg every 6 hours (40 mg/day of oxycodone) MME/day:60 mg/day   Janett Billow, RN  01/08/2017 11:11 AM  Sign at close encounter Nursing Pain Medication Assessment:  Safety precautions to be maintained throughout the outpatient stay will include: orient to surroundings, keep bed in low position, maintain call bell within reach at all times, provide assistance with transfer out of bed and ambulation.  Medication Inspection Compliance: Pill count conducted under aseptic conditions, in front of the patient. Neither the pills nor the bottle was removed from the patient's sight at any time. Once count was completed pills were immediately returned to the patient in their original bottle.  Medication: Oxycodone IR Pill/Patch Count: 104 of 120 pills remain Pill/Patch Appearance: Markings consistent with prescribed medication Bottle Appearance: Standard pharmacy container. Clearly labeled. Filled Date: 71 / 2 / 2018 Last Medication intake:  Today   Pharmacokinetics: Liberation and absorption (onset of action): WNL Distribution (time to peak effect): WNL Metabolism and excretion (duration of action): WNL         Pharmacodynamics: Desired effects: Analgesia: Kristopher [name] reports >50% benefit. Functional ability: Patient reports that medication allows him to accomplish basic ADLs Clinically meaningful improvement in function (CMIF): Sustained CMIF goals met Perceived effectiveness: Described as relatively effective, allowing for increase in activities of daily living (ADL) Undesirable effects: Side-effects or Adverse reactions: None reported Monitoring: [place] PMP: Online review of  the past 8-monthperiod conducted. Compliant with practice rules and regulations Last UDS on record: Summary  Date Value Ref Range Status  07/10/2016 FINAL  Final    Comment:    ==================================================================== TOXASSURE SELECT 13 (MW) ==================================================================== Test                             Result       Flag       Units Drug Present and Declared for Prescription Verification   Oxycodone                      109          EXPECTED   ng/mg creat   Oxymorphone                    296          EXPECTED   ng/mg creat   Noroxycodone                   211          EXPECTED   ng/mg creat   Noroxymorphone                 69           EXPECTED   ng/mg creat    Sources of oxycodone are scheduled prescription medications.    Oxymorphone, noroxycodone, and noroxymorphone are expected    metabolites of oxycodone. Oxymorphone is also available as a    scheduled prescription medication. ==================================================================== Test                      Result    Flag   Units      Ref Range  Creatinine              139              mg/dL      >=20 ==================================================================== Declared Medications:  The flagging and interpretation on this report are based on the  following declared medications.  Unexpected results may arise from  inaccuracies in the declared medications.  **Note: The testing scope of this panel includes these medications:  Oxycodone  **Note: The testing scope of this panel does not include following  reported medications:  Albuterol  Erythromycin  Fluticasone (Advair)  Hydrochlorothiazide  Metoprolol  Naloxone  Nortriptyline  Omeprazole  Potassium  Salmeterol (Advair)  Sumatriptan  Topical ==================================================================== For clinical consultation, please call [phone]. ====================================================================    UDS interpretation: Compliant          Medication Assessment Form: Reviewed. Patient indicates being compliant with therapy Treatment compliance: Compliant Risk Assessment Profile: Aberrant behavior: See prior evaluations. None observed or detected today Comorbid factors increasing risk of overdose: See prior notes. No additional risks detected today Risk of substance use disorder (SUD): Low Opioid Risk Tool - 01/08/17 1107      Family History of Substance Abuse   Alcohol  Negative    Illegal Drugs  Negative    Rx Drugs  Negative      Personal History of Substance Abuse   Alcohol  Negative    Illegal Drugs  Negative    Rx Drugs  Negative      Psychological Disease   Psychological Disease  Negative    Depression  Negative      Total Score   Opioid Risk Tool Scoring  0    Opioid Risk Interpretation  Low Risk      ORT Scoring interpretation table:  Score <3 = Low Risk for SUD  Score between 4-7 = Moderate Risk for SUD  Score >8 = High Risk for Opioid Abuse   Risk Mitigation Strategies:  Patient Counseling: Covered Patient-Prescriber Agreement (PPA): Present and active  Notification to other healthcare providers: Done  Pharmacologic Plan: No change in therapy, at this time  Laboratory Chemistry  Inflammation Markers (CRP: Acute Phase) (ESR: Chronic Phase) Lab Results  Component Value Date   CRP 0.7 04/03/2015   ESRSEDRATE 11 04/03/2015                 Renal Function Markers Lab Results  Component Value Date   BUN 17 08/12/2016   CREATININE 0.98 08/12/2016   GFRAA 101 08/12/2016   GFRNONAA 87 08/12/2016                 Hepatic Function Markers Lab Results  Component Value Date   AST 31 06/24/2016   ALT 51 (H) 06/24/2016   ALBUMIN 4.2 06/24/2016   ALKPHOS 113 06/24/2016                 Electrolytes Lab Results  Component Value Date   NA 142 08/12/2016   K 3.7  08/12/2016   CL 103 08/12/2016   CALCIUM 9.6 08/12/2016   MG 2.0 04/03/2015                 Neuropathy Markers No results found for: [id number]               Bone Pathology Markers Lab Results  Component Value Date   ALKPHOS 113 06/24/2016   CALCIUM 9.6 08/12/2016  Coagulation Parameters Lab Results  Component Value Date   PLT 209 08/12/2016                 Cardiovascular Markers Lab Results  Component Value Date   HGB 13.7 08/12/2016   HCT 40.4 08/12/2016                 Note: Lab results reviewed.  Recent Diagnostic Imaging Results  DG HIP UNILAT W OR W/O PELVIS 2-3 VIEWS LEFT CLINICAL DATA:  [place] foot off a roof in 2011, chronic low back pain radiating to LEFT hip since, multiple back surgeries since fall  EXAM: DG HIP (WITH OR WITHOUT PELVIS) 2-3V LEFT  COMPARISON:  Non  FINDINGS: Osseous mineralization grossly normal.  Prior L5-S1 fusion.  SI joints symmetric and preserved.  Degenerative changes of both hip joints with joint space narrowing and spur formation greater on LEFT.  No acute fracture, dislocation, or bone destruction.  IMPRESSION: Degenerative changes of BILATERAL hip joints.  Electronically Signed   By: Lavonia Dana M.D.   On: 06/06/2015 13:14  Complexity Note: Imaging results reviewed. Results shared with Kristopher [name], using Layman's terms.                         Meds   Current Outpatient Medications:  .  albuterol (PROVENTIL HFA;VENTOLIN HFA) 108 (90 Base) MCG/ACT inhaler, Inhale 2 puffs into the lungs every 6 (six) hours as needed for wheezing or shortness of breath., Disp: 1 Inhaler, Rfl: 2 .  amLODipine-valsartan (EXFORGE) 10-320 MG tablet, Take 1 tablet by mouth daily., Disp: 90 tablet, Rfl: 1 .  doxycycline (VIBRA-TABS) 100 MG tablet, Take 1 tablet (100 mg total) by mouth 2 (two) times daily., Disp: 20 tablet, Rfl: 0 .  ERY-TAB 333 MG EC tablet, 333 mg as needed. , Disp: , Rfl:  .  Naloxone HCl (NARCAN) 4  MG/0.1ML LIQD, Place 1 Bottle into the nose once. , then call 911, repeat if needed in other nostril with new bottle., Disp: 2 each, Rfl: 0 .  omeprazole (PRILOSEC) 20 MG capsule, Take 1 capsule (20 mg total) by mouth daily., Disp: 30 capsule, Rfl: 3 .  SUMAtriptan (IMITREX) 100 MG tablet, Take 1 tablet earliest onset of headache.  May repeat once after 2 hours if headache persists or recurs.  Do not exceed 2 tablets in 24 hrs, Disp: 10 tablet, Rfl: 12 .  [START ON 04/03/2017] Oxycodone HCl 10 MG TABS, Take 1 tablet (10 mg total) every 6 (six) hours as needed by mouth., Disp: 120 tablet, Rfl: 0 .  [START ON 03/04/2017] Oxycodone HCl 10 MG TABS, Take 1 tablet (10 mg total) every 6 (six) hours as needed by mouth., Disp: 120 tablet, Rfl: 0 .  [START ON 02/02/2017] Oxycodone HCl 10 MG TABS, Take 1 tablet (10 mg total) every 6 (six) hours as needed by mouth., Disp: 120 tablet, Rfl: 0  ROS  Constitutional: Denies any fever or chills Gastrointestinal: No reported hemesis, hematochezia, vomiting, or acute GI distress Musculoskeletal: Denies any acute onset joint swelling, redness, loss of ROM, or weakness Neurological: No reported episodes of acute onset apraxia, aphasia, dysarthria, agnosia, amnesia, paralysis, loss of coordination, or loss of consciousness  Allergies  Kristopher [name] is allergic to lidocaine; penicillins; strawberry (diagnostic); aspirin; and novocain [procaine].  Kristopher [name]  Drug: Kristopher [name]  reports that he does not use drugs. Alcohol:  reports that he does not drink alcohol. Tobacco:  reports that  he has been smoking cigarettes.  He has a 26.25 pack-year smoking history. he has never used smokeless tobacco. Medical:  has a past medical history of Allergy, Arthritis, Chronic back pain, Hypertension, and Migraines. Surgical: Kristopher [name]  has a past surgical history that includes Back surgery; Appendectomy; Leg Surgery; Knee surgery (Left); Foot surgery (Left); COLONOSCOPY WITH PROPOFOL (N/A,  02/19/2016); and POLYPECTOMY (N/A, 02/19/2016). Family: family history includes Cancer in his father; Diabetes in his sister; Thrombosis in his sister.  Constitutional Exam  General appearance: Well nourished, well developed, and well hydrated. In no apparent acute distress Vitals:   01/08/17 1101  BP: (!) 159/99  Pulse: 84  Resp: 16  Temp: 98.1 F (36.7 C)  TempSrc: Oral  SpO2: 99%  Weight: 220 lb (99.8 kg)  Height: _0  (1.778 m)   BMI Assessment: Estimated body mass index is 31.57 kg/m as calculated from the following:   Height as of this encounter: _1  (1.778 m).   Weight as of this encounter: 220 lb (99.8 kg).  BMI interpretation table: BMI level Category Range association with higher incidence of chronic pain  <18 kg/m2 Underweight   18.5-24.9 kg/m2 Ideal body weight   25-29.9 kg/m2 Overweight Increased incidence by 20%  30-34.9 kg/m2 Obese (Class I) Increased incidence by 68%  35-39.9 kg/m2 Severe obesity (Class II) Increased incidence by 136%  >40 kg/m2 Extreme obesity (Class III) Increased incidence by 254%   BMI Readings from Last 4 Encounters:  01/08/17 31.57 kg/m  12/02/16 32.00 kg/m  10/10/16 28.70 kg/m  08/12/16 32.24 kg/m   Wt Readings from Last 4 Encounters:  01/08/17 220 lb (99.8 kg)  12/02/16 223 lb (101.2 kg)  10/10/16 200 lb (90.7 kg)  08/12/16 224 lb 11.2 oz (101.9 kg)  Psych/Mental status: Alert, oriented x 3 (person, place, & time)       Eyes: PERLA Respiratory: No evidence of acute respiratory distress  Cervical Spine Area Exam  Skin & Axial Inspection: No masses, redness, edema, swelling, or associated skin lesions Alignment: Symmetrical Functional ROM: Unrestricted ROM      Stability: No instability detected Muscle Tone/Strength: Functionally intact. No obvious neuro-muscular anomalies detected. Sensory (Neurological): Unimpaired Palpation: No palpable anomalies              Upper Extremity (UE) Exam    Side: Right upper  extremity  Side: Left upper extremity  Skin & Extremity Inspection: Skin color, temperature, and hair growth are WNL. No peripheral edema or cyanosis. No masses, redness, swelling, asymmetry, or associated skin lesions. No contractures.  Skin & Extremity Inspection: Skin color, temperature, and hair growth are WNL. No peripheral edema or cyanosis. No masses, redness, swelling, asymmetry, or associated skin lesions. No contractures.  Functional ROM: Unrestricted ROM          Functional ROM: Unrestricted ROM          Muscle Tone/Strength: Functionally intact. No obvious neuro-muscular anomalies detected.  Muscle Tone/Strength: Functionally intact. No obvious neuro-muscular anomalies detected.  Sensory (Neurological): Unimpaired          Sensory (Neurological): Unimpaired          Palpation: No palpable anomalies              Palpation: No palpable anomalies              Specialized Test(s): Deferred         Specialized Test(s): Deferred          Thoracic Spine Area Exam  Skin & Axial Inspection: No masses, redness, or swelling Alignment: Symmetrical Functional ROM: Unrestricted ROM Stability: No instability detected Muscle Tone/Strength: Functionally intact. No obvious neuro-muscular anomalies detected. Sensory (Neurological): Unimpaired Muscle strength & Tone: No palpable anomalies  Lumbar Spine Area Exam  Skin & Axial Inspection: No masses, redness, or swelling Alignment: Symmetrical Functional ROM: Unrestricted ROM      Stability: No instability detected Muscle Tone/Strength: Functionally intact. No obvious neuro-muscular anomalies detected. Sensory (Neurological): Unimpaired Palpation: Complains of area being tender to palpation       Provocative Tests: Lumbar Hyperextension and rotation test: Positive bilaterally for facet joint pain. Lumbar Lateral bending test: evaluation deferred today       Patrick's Maneuver: evaluation deferred today                    Gait & Posture Assessment   Ambulation: Unassisted Gait: Relatively normal for age and body habitus Posture: WNL   Lower Extremity Exam    Side: Right lower extremity  Side: Left lower extremity  Skin & Extremity Inspection: Skin color, temperature, and hair growth are WNL. No peripheral edema or cyanosis. No masses, redness, swelling, asymmetry, or associated skin lesions. No contractures.  Skin & Extremity Inspection: Skin color, temperature, and hair growth are WNL. No peripheral edema or cyanosis. No masses, redness, swelling, asymmetry, or associated skin lesions. No contractures.  Functional ROM: Decreased ROM          Functional ROM: Unrestricted ROM          Muscle Tone/Strength: Functionally intact. No obvious neuro-muscular anomalies detected.  Muscle Tone/Strength: Functionally intact. No obvious neuro-muscular anomalies detected.  Sensory (Neurological): Unimpaired  Sensory (Neurological): Unimpaired  Palpation: No palpable anomalies  Palpation: No palpable anomalies   Assessment  Primary Diagnosis & Pertinent Problem List: The primary encounter diagnosis was Chronic low back pain (Location of Primary Source of Pain) (Bilateral) (L>R). Diagnoses of Chronic cervical radicular pain (Location of Tertiary source of pain) (Bilateral) (C5/C6 dermatome) (L>R), Chronic hip pain (Left), Chronic pain syndrome, and Long term current use of opiate analgesic were also pertinent to this visit.  Status Diagnosis  Worsening Controlled Worsening 1. Chronic low back pain (Location of Primary Source of Pain) (Bilateral) (L>R)   2. Chronic cervical radicular pain (Location of Tertiary source of pain) (Bilateral) (C5/C6 dermatome) (L>R)   3. Chronic hip pain (Left)   4. Chronic pain syndrome   5. Long term current use of opiate analgesic     Problems updated and reviewed during this visit: No problems updated. Plan of Care  Pharmacotherapy (Medications Ordered): Meds ordered this encounter  Medications  . Oxycodone HCl  10 MG TABS    Sig: Take 1 tablet (10 mg total) every 6 (six) hours as needed by mouth.    Dispense:  120 tablet    Refill:  0    Do not place this medication, or any other prescription from our practice, on "Automatic Refill". Patient may have prescription filled one day early if pharmacy is closed on scheduled refill date. Do not fill until:04/03/2017 To last until: 05/03/2017    Order Specific Question:   Supervising Provider    Answer:   [name] [phone]  . Oxycodone HCl 10 MG TABS    Sig: Take 1 tablet (10 mg total) every 6 (six) hours as needed by mouth.    Dispense:  120 tablet    Refill:  0    Do not place this medication, or any  other prescription from our practice, on "Automatic Refill". Patient may have prescription filled one day early if pharmacy is closed on scheduled refill date. Do not fill until: 03/04/2017 To last until: 04/03/2017    Order Specific Question:   Supervising Provider    Answer:   [name] [phone]  . Oxycodone HCl 10 MG TABS    Sig: Take 1 tablet (10 mg total) every 6 (six) hours as needed by mouth.    Dispense:  120 tablet    Refill:  0    Do not place this medication, or any other prescription from our practice, on "Automatic Refill". Patient may have prescription filled one day early if pharmacy is closed on scheduled refill date. Do not fill until: 02/02/2017 To last until:03/04/2017    Order Specific Question:   Supervising Provider    Answer:   [name] [phone]  This SmartLink is deprecated. Use AVSMEDLIST instead to display the medication list for a patient. Medications administered today: Kristopher [name] had no medications administered during this visit. Lab-work, procedure(s), and/or referral(s): Orders Placed This Encounter  Procedures  . ToxASSURE Select 13 (MW), Urine   Imaging and/or referral(s): None  Interventional therapies: Planned, scheduled, and/or pending:   Not at this time.   Considering:   None at  this time   Palliative PRN treatment(s):   None at this time      Provider-requested follow-up: Return in about 3 months (around 04/10/2017) for MedMgmt.  Future Appointments  Date Time Provider [place]  02/12/2017  9:30 AM [name], DO [facility]  04/03/2017  8:45 AM Vevelyn Francois, NP [facility] None   Primary Care Physician: [name], DO Location: [facility] Facility Note by: Vevelyn Francois NP Date: 01/08/2017; Time: 12:00 PM  Pain Score Disclaimer: We use the NRS-11 scale. This is a self-reported, subjective measurement of pain severity with only modest accuracy. It is used primarily to identify changes within a particular patient. It must be understood that outpatient pain scales are significantly less accurate that those used for research, where they can be applied under ideal controlled circumstances with minimal exposure to variables. In reality, the score is likely to be a combination of pain intensity and pain affect, where pain affect describes the degree of emotional arousal or changes in action readiness caused by the sensory experience of pain. Factors such as social and work situation, setting, emotional state, anxiety levels, expectation, and prior pain experience may influence pain perception and show large inter-individual differences that may also be affected by time variables.  Patient instructions provided during this appointment: Patient Instructions    ____________________________________________________________________________________________  Medication Rules  Applies to: All patients receiving prescriptions (written or electronic).  Pharmacy of record: Pharmacy where electronic prescriptions will be sent. If written prescriptions are taken to a different pharmacy, please inform the nursing staff. The pharmacy listed in the electronic medical record should be the one where you would like electronic prescriptions  to be sent.  Prescription refills: Only during scheduled appointments. Applies to both, written and electronic prescriptions.  NOTE: The following applies primarily to controlled substances (Opioid* Pain Medications).   Patient's responsibilities: 1. Pain Pills: Bring all pain pills to every appointment (except for procedure appointments). 2. Pill Bottles: Bring pills in original pharmacy bottle. Always bring newest bottle. Bring bottle, even if empty. 3. Medication refills: You are responsible for knowing and keeping track of what medications you need refilled. The day before your  appointment, write a list of all prescriptions that need to be refilled. Bring that list to your appointment and give it to the admitting nurse. Prescriptions will be written only during appointments. If you forget a medication, it will not be "Called in", "Faxed", or "electronically sent". You will need to get another appointment to get these prescribed. 4. Prescription Accuracy: You are responsible for carefully inspecting your prescriptions before leaving our office. Have the discharge nurse carefully go over each prescription with you, before taking them home. Make sure that your name is accurately spelled, that your address is correct. Check the name and dose of your medication to make sure it is accurate. Check the number of pills, and the written instructions to make sure they are clear and accurate. Make sure that you are given enough medication to last until your next medication refill appointment. 5. Taking Medication: Take medication as prescribed. Never take more pills than instructed. Never take medication more frequently than prescribed. Taking less pills or less frequently is permitted and encouraged, when it comes to controlled substances (written prescriptions).  6. Inform other Doctors: Always inform, all of your healthcare providers, of all the medications you take. 7. Pain Medication from other Providers:  You are not allowed to accept any additional pain medication from any other Doctor or Healthcare provider. There are two exceptions to this rule. (see below) In the event that you require additional pain medication, you are responsible for notifying us, as stated below. 8. Medication Agreement: You are responsible for carefully reading and following our Medication Agreement. This must be signed before receiving any prescriptions from our practice. Safely store a copy of your signed Agreement. Violations to the Agreement will result in no further prescriptions. (Additional copies of our Medication Agreement are available upon request.) 9. Laws, Rules, & Regulations: All patients are expected to follow all Federal and [employer], [employer], Rules, [employer]. Ignorance of the Laws does not constitute a valid excuse. The use of any illegal substances is prohibited. 10. Adopted CDC guidelines & recommendations: Target dosing levels will be at or below 60 MME/day. Use of benzodiazepines** is not recommended.  Exceptions: There are only two exceptions to the rule of not receiving pain medications from other Healthcare Providers. 1. Exception #1 (Emergencies): In the event of an emergency (i.e.: accident requiring emergency care), you are allowed to receive additional pain medication. However, you are responsible for: As soon as you are able, call our office [phone], at any time of the day or night, and leave a message stating your name, the date and nature of the emergency, and the name and dose of the medication prescribed. In the event that your call is answered by a member of our staff, make sure to document and save the date, time, and the name of the person that took your information.  2. Exception #2 (Planned Surgery): In the event that you are scheduled by another doctor or dentist to have any type of surgery or procedure, you are allowed (for a period no longer than 30 days), to receive additional  pain medication, for the acute post-op pain. However, in this case, you are responsible for picking up a copy of our "Post-op Pain Management for Surgeons" handout, and giving it to your surgeon or dentist. This document is available at our office, and does not require an appointment to obtain it. Simply go to our office during business hours (Monday-Thursday from 8:00 AM to 4:00 PM) (Friday 8:00 AM  to 12:00 Noon) or if you have a scheduled appointment with [place], prior to your surgery, and ask for it by name. In addition, you will need to provide [place] with your name, name of your surgeon, type of surgery, and date of procedure or surgery.  *Opioid medications include: morphine, codeine, oxycodone, oxymorphone, hydrocodone, hydromorphone, meperidine, tramadol, tapentadol, buprenorphine, fentanyl, methadone. **Benzodiazepine medications include: diazepam (Valium), alprazolam (Xanax), clonazepam (Klonopine), lorazepam (Ativan), clorazepate (Tranxene), chlordiazepoxide (Librium), estazolam (Prosom), oxazepam (Serax), temazepam (Restoril), triazolam (Halcion)  ____________________________________________________________________________________________  ____________________________________________________________________________________________  Pain Scale  Introduction: The pain score used by this practice is the Verbal Numerical Rating Scale (VNRS-11). This is an 11-point scale. It is for adults and children 10 years or older. There are significant differences in how the pain score is reported, used, and applied. Forget everything you learned in the past and learn this scoring system.  General Information: The scale should reflect your current level of pain. Unless you are specifically asked for the level of your worst pain, or your average pain. If you are asked for one of these two, then it should be understood that it is over the past 24 hours.  Basic Activities of Daily Living (ADL): Personal hygiene,  dressing, eating, transferring, and using restroom.  Instructions: Most patients tend to report their level of pain as a combination of two factors, their physical pain and their psychosocial pain. This last one is also known as "suffering" and it is reflection of how physical pain affects you socially and psychologically. From now on, report them separately. From this point on, when asked to report your pain level, report only your physical pain. Use the following table for reference.  Pain Clinic Pain Levels (0-5/10)  Pain Level Score  Description  No Pain 0   Mild pain 1 Nagging, annoying, but does not interfere with basic activities of daily living (ADL). Patients are able to eat, bathe, get dressed, toileting (being able to get on and off the toilet and perform personal hygiene functions), transfer (move in and out of bed or a chair without assistance), and maintain continence (able to control bladder and bowel functions). Blood pressure and heart rate are unaffected. A normal heart rate for a healthy adult ranges from 60 to 100 bpm (beats per minute).   Mild to moderate pain 2 Noticeable and distracting. Impossible to hide from other people. More frequent flare-ups. Still possible to adapt and function close to normal. It can be very annoying and may have occasional stronger flare-ups. With discipline, patients may get used to it and adapt.   Moderate pain 3 Interferes significantly with activities of daily living (ADL). It becomes difficult to feed, bathe, get dressed, get on and off the toilet or to perform personal hygiene functions. Difficult to get in and out of bed or a chair without assistance. Very distracting. With effort, it can be ignored when deeply involved in activities.   Moderately severe pain 4 Impossible to ignore for more than a few minutes. With effort, patients may still be able to manage work or participate in some social activities. Very difficult to concentrate. Signs of  autonomic nervous system discharge are evident: dilated pupils (mydriasis); mild sweating (diaphoresis); sleep interference. Heart rate becomes elevated (>115 bpm). Diastolic blood pressure (lower number) rises above 100 mmHg. Patients find relief in laying down and not moving.   Severe pain 5 Intense and extremely unpleasant. Associated with frowning face and frequent crying. Pain overwhelms the senses.  Ability to do any  activity or maintain social relationships becomes significantly limited. Conversation becomes difficult. Pacing back and forth is common, as getting into a comfortable position is nearly impossible. Pain wakes you up from deep sleep. Physical signs will be obvious: pupillary dilation; increased sweating; goosebumps; brisk reflexes; cold, clammy hands and feet; nausea, vomiting or dry heaves; loss of appetite; significant sleep disturbance with inability to fall asleep or to remain asleep. When persistent, significant weight loss is observed due to the complete loss of appetite and sleep deprivation.  Blood pressure and heart rate becomes significantly elevated. Caution: If elevated blood pressure triggers a pounding headache associated with blurred vision, then the patient should immediately seek attention at an urgent or emergency care unit, as these may be signs of an impending stroke.    Emergency Department Pain Levels (6-10/10)  Emergency Room Pain 6 Severely limiting. Requires emergency care and should not be seen or managed at an outpatient pain management facility. Communication becomes difficult and requires great effort. Assistance to reach the emergency department may be required. Facial flushing and profuse sweating along with potentially dangerous increases in heart rate and blood pressure will be evident.   Distressing pain 7 Self-care is very difficult. Assistance is required to transport, or use restroom. Assistance to reach the emergency department will be required. Tasks  requiring coordination, such as bathing and getting dressed become very difficult.   Disabling pain 8 Self-care is no longer possible. At this level, pain is disabling. The individual is unable to do even the most "basic" activities such as walking, eating, bathing, dressing, transferring to a bed, or toileting. Fine motor skills are lost. It is difficult to think clearly.   Incapacitating pain 9 Pain becomes incapacitating. Thought processing is no longer possible. Difficult to remember your own name. Control of movement and coordination are lost.   The worst pain imaginable 10 At this level, most patients pass out from pain. When this level is reached, collapse of the autonomic nervous system occurs, leading to a sudden drop in blood pressure and heart rate. This in turn results in a temporary and dramatic drop in blood flow to the brain, leading to a loss of consciousness. Fainting is one of the body's self defense mechanisms. Passing out puts the brain in a calmed state and causes it to shut down for a while, in order to begin the healing process.    Summary: 1. Refer to this scale when providing [place] with your pain level. 2. Be accurate and careful when reporting your pain level. This will help with your care. 3. Over-reporting your pain level will lead to loss of credibility. 4. Even a level of 1/10 means that there is pain and will be treated at our facility. 5. High, inaccurate reporting will be documented as "Symptom Exaggeration", leading to loss of credibility and suspicions of possible secondary gains such as obtaining more narcotics, or wanting to appear disabled, for fraudulent reasons. 6. Only pain levels of 5 or below will be seen at our facility. 7. Pain levels of 6 and above will be sent to the Emergency Department and the appointment cancelled. ____________________________________________________________________________________________    BMI Assessment: Estimated body mass  index is 31.57 kg/m as calculated from the following:   Height as of this encounter: '5\' 10"'$  (1.778 m).   Weight as of this encounter: 220 lb (99.8 kg).  BMI interpretation table: BMI level Category Range association with higher incidence of chronic pain  <18 kg/m2 Underweight   18.5-24.9 kg/m2  Ideal body weight   25-29.9 kg/m2 Overweight Increased incidence by 20%  30-34.9 kg/m2 Obese (Class I) Increased incidence by 68%  35-39.9 kg/m2 Severe obesity (Class II) Increased incidence by 136%  >40 kg/m2 Extreme obesity (Class III) Increased incidence by 254%   BMI Readings from Last 4 Encounters:  01/08/17 31.57 kg/m  12/02/16 32.00 kg/m  10/10/16 28.70 kg/m  08/12/16 32.24 kg/m   Wt Readings from Last 4 Encounters:  01/08/17 220 lb (99.8 kg)  12/02/16 223 lb (101.2 kg)  10/10/16 200 lb (90.7 kg)  08/12/16 224 lb 11.2 oz (101.9 kg)

## 2017-01-15 LAB — TOXASSURE SELECT 13 (MW), URINE

## 2017-02-12 ENCOUNTER — Ambulatory Visit: Payer: Medicare Other | Admitting: Family Medicine

## 2017-03-12 ENCOUNTER — Ambulatory Visit: Payer: Medicare Other | Admitting: Family Medicine

## 2017-03-31 ENCOUNTER — Encounter: Payer: Self-pay | Admitting: Family Medicine

## 2017-03-31 ENCOUNTER — Ambulatory Visit (INDEPENDENT_AMBULATORY_CARE_PROVIDER_SITE_OTHER): Payer: Medicare Other | Admitting: Family Medicine

## 2017-03-31 VITALS — BP 152/112 | HR 80 | Temp 98.5°F | Wt 228.1 lb

## 2017-03-31 DIAGNOSIS — M533 Sacrococcygeal disorders, not elsewhere classified: Secondary | ICD-10-CM | POA: Diagnosis not present

## 2017-03-31 DIAGNOSIS — R079 Chest pain, unspecified: Secondary | ICD-10-CM

## 2017-03-31 DIAGNOSIS — I129 Hypertensive chronic kidney disease with stage 1 through stage 4 chronic kidney disease, or unspecified chronic kidney disease: Secondary | ICD-10-CM | POA: Diagnosis not present

## 2017-03-31 DIAGNOSIS — K219 Gastro-esophageal reflux disease without esophagitis: Secondary | ICD-10-CM

## 2017-03-31 DIAGNOSIS — G894 Chronic pain syndrome: Secondary | ICD-10-CM | POA: Diagnosis not present

## 2017-03-31 DIAGNOSIS — G43009 Migraine without aura, not intractable, without status migrainosus: Secondary | ICD-10-CM | POA: Diagnosis not present

## 2017-03-31 DIAGNOSIS — G8929 Other chronic pain: Secondary | ICD-10-CM | POA: Diagnosis not present

## 2017-03-31 DIAGNOSIS — Z720 Tobacco use: Secondary | ICD-10-CM

## 2017-03-31 LAB — MICROALBUMIN, URINE WAIVED
Creatinine, Urine Waived: 200 mg/dL (ref 10–300)
Microalb, Ur Waived: 30 mg/L — ABNORMAL HIGH (ref 0–19)
Microalb/Creat Ratio: <30 mg/g (ref <30)

## 2017-03-31 MED ORDER — BACLOFEN 20 MG PO TABS
20.0000 mg | ORAL_TABLET | Freq: Every evening | ORAL | 3 refills | Status: DC | PRN
Start: 2017-03-31 — End: 2017-06-03

## 2017-03-31 MED ORDER — HYDROCHLOROTHIAZIDE 25 MG PO TABS: 25.0000 mg | ORAL_TABLET | Freq: Every day | ORAL | 3 refills | Status: DC

## 2017-03-31 NOTE — Assessment & Plan Note (Signed)
Cannot have injections. Will give some baclofen and refer to PT. Call with any concerns.

## 2017-03-31 NOTE — Assessment & Plan Note (Signed)
Continue to follow with pain management. Call with any concerns. Stable.

## 2017-03-31 NOTE — Progress Notes (Signed)
BP (!) 152/112 (BP Location: Left Arm, Patient Position: Sitting, Cuff Size: Normal)   Pulse 80   Temp 98.5 F (36.9 C)   Wt 228 lb 1 oz (103.4 kg)   SpO2 98%   BMI 32.72 kg/m    Subjective:    Patient ID: Kristopher [name], male    DOB: [birth year], 57 y.o.   MRN: [id number]  HPI: Kristopher [name] is a 57 y.o. male  Chief Complaint  Patient presents with  . Hypertension   Has been having pain in his L hip with severe pain. Can't have shots, has allergy to lidocaine. Follows with pain management.   HYPERTENSION Hypertension status: uncontrolled  Satisfied with current treatment? no Duration of hypertension: chronic BP monitoring frequency:  a few times a month BP medication side effects:  no Medication compliance: excellent compliance Previous BP meds: valsartan-amlodipine Aspirin: no Recurrent headaches: no Visual changes: no Palpitations: no Dyspnea: no Chest pain: no Lower extremity edema: no Dizzy/lightheaded: no  GERD GERD control status: uncontrolled  Satisfied with current treatment? no Heartburn frequency: occasionally Medication side effects: None  Medication compliance: good Previous GERD medications: omeprazole Dysphagia: no Odynophagia:  no Hematemesis: no Blood in stool: no EGD: no  CHEST PAIN Time since onset: couple of weeks off and on Onset: sudden Quality: sharp Severity: moderate Location: left para substernal Radiation: none Episode duration: few minutes Frequency: intermittent Related to exertion: no Activity when pain started: laying down relaxing at night Trauma: no Anxiety/recent stressors: no Aggravating factors:  Alleviating factors:  Status: fluctuating Treatments attempted: nothing  Current pain status: pain free Shortness of breath: no Cough: no Nausea: no Diaphoresis: no Heartburn: no Palpitations: no  Chronic Pain/Migraines- following with pain clinic. OK.  Relevant past medical, surgical, family and social  history reviewed and updated as indicated. Interim medical history since our last visit reviewed. Allergies and medications reviewed and updated.  Review of Systems  Constitutional: Negative.   Respiratory: Negative.   Cardiovascular: Positive for chest pain. Negative for palpitations and leg swelling.  Gastrointestinal: Negative.   Musculoskeletal: Positive for arthralgias, back pain and myalgias. Negative for gait problem, joint swelling, neck pain and neck stiffness.  Skin: Negative.   Neurological: Negative.   Psychiatric/Behavioral: Negative.     Per HPI unless specifically indicated above     Objective:    BP (!) 152/112 (BP Location: Left Arm, Patient Position: Sitting, Cuff Size: Normal)   Pulse 80   Temp 98.5 F (36.9 C)   Wt 228 lb 1 oz (103.4 kg)   SpO2 98%   BMI 32.72 kg/m   Wt Readings from Last 3 Encounters:  03/31/17 228 lb 1 oz (103.4 kg)  01/08/17 220 lb (99.8 kg)  12/02/16 223 lb (101.2 kg)    Physical Exam  Constitutional: He is oriented to person, place, and time. He appears well-developed and well-nourished. No distress.  HENT:  Head: Normocephalic and atraumatic.  Right Ear: Hearing normal.  Left Ear: Hearing normal.  Nose: Nose normal.  Eyes: Conjunctivae and lids are normal. Right eye exhibits no discharge. Left eye exhibits no discharge. No scleral icterus.  Cardiovascular: Normal rate, regular rhythm, normal heart sounds and intact distal pulses. Exam reveals no gallop and no friction rub.  No murmur heard. Pulmonary/Chest: Effort normal and breath sounds normal. No respiratory distress. He has no wheezes. He has no rales. He exhibits no tenderness.  Abdominal: Soft. Bowel sounds are normal. He exhibits no distension and no mass. There is no tenderness.  There is no rebound and no guarding.  Musculoskeletal: Normal range of motion.  Neurological: He is alert and oriented to person, place, and time.  Skin: Skin is warm, dry and intact. No rash  noted. He is not diaphoretic. No erythema. No pallor.  Psychiatric: He has a normal mood and affect. His speech is normal and behavior is normal. Judgment and thought content normal. Cognition and memory are normal.  Nursing note and vitals reviewed.   Results for orders placed or performed in visit on 01/08/17  ToxASSURE Select 82 (MW), Urine  Result Value Ref Range   Summary FINAL       Assessment & Plan:   Problem List Items Addressed This Visit      Cardiovascular and Mediastinum   Migraine    Stable. Continue current regimen. Continue to monitor. Call with any concerns.       Relevant Medications   hydrochlorothiazide (HYDRODIURIL) 25 MG tablet   baclofen (LIORESAL) 20 MG tablet   Other Relevant Orders   CBC with Differential/Platelet   Comprehensive metabolic panel     Digestive   GERD (gastroesophageal reflux disease)    Will increase omeprazole to 40mg  daily in case his chest pain is GI related. Continue to monitor.       Relevant Orders   CBC with Differential/Platelet   Comprehensive metabolic panel     Genitourinary   Benign hypertensive renal disease - Primary    Not under good control. Will add HCTZ and recheck 1 month.       Relevant Orders   CBC with Differential/Platelet   Comprehensive metabolic panel   Microalbumin, Urine Waived     Other   Chronic sacroiliac joint pain (Bilateral) (L>R) (Chronic)    Cannot have injections. Will give some baclofen and refer to PT. Call with any concerns.       Relevant Medications   baclofen (LIORESAL) 20 MG tablet   Chronic pain syndrome (Chronic)    Continue to follow with pain management. Call with any concerns. Stable.       Relevant Orders   CBC with Differential/Platelet   Comprehensive metabolic panel   Tobacco abuse    He knows that we are here if he needs help quitting. Call with any concerns.      Relevant Orders   CBC with Differential/Platelet   Comprehensive metabolic panel    Other  Visit Diagnoses    Chest pain, unspecified type       Some mild changes on EKG- will get him into cardiology for evaluation and possible stress test. Call with any concerns.    Relevant Orders   EKG 12-Lead (Completed)   Ambulatory referral to Cardiology       Follow up plan: Return in about 4 weeks (around 04/28/2017) for Recheck BP.

## 2017-03-31 NOTE — Assessment & Plan Note (Signed)
He knows that we are here if he needs help quitting. Call with any concerns.

## 2017-03-31 NOTE — Assessment & Plan Note (Signed)
Not under good control. Will add HCTZ and recheck 1 month.

## 2017-03-31 NOTE — Assessment & Plan Note (Signed)
Stable. Continue current regimen. Continue to monitor. Call with any concerns.  

## 2017-03-31 NOTE — Assessment & Plan Note (Signed)
Will increase omeprazole to 40mg  daily in case his chest pain is GI related. Continue to monitor.

## 2017-04-01 ENCOUNTER — Telehealth: Payer: Self-pay | Admitting: Family Medicine

## 2017-04-01 ENCOUNTER — Encounter: Payer: Self-pay | Admitting: Family Medicine

## 2017-04-01 LAB — CBC WITH DIFFERENTIAL/PLATELET
Basophils Absolute: 0.1 10*3/uL (ref 0.0–0.2)
Basos: 1 %
EOS (ABSOLUTE): 0.1 10*3/uL (ref 0.0–0.4)
Eos: 2 %
Hematocrit: 45.1 % (ref 37.5–51.0)
Hemoglobin: 14.9 g/dL (ref 13.0–17.7)
Immature Grans (Abs): 0 10*3/uL (ref 0.0–0.1)
Immature Granulocytes: 0 %
Lymphocytes Absolute: 2.3 10*3/uL (ref 0.7–3.1)
Lymphs: 43 %
MCH: 29.4 pg (ref 26.6–33.0)
MCHC: 33 g/dL (ref 31.5–35.7)
MCV: 89 fL (ref 79–97)
Monocytes Absolute: 0.5 10*3/uL (ref 0.1–0.9)
Monocytes: 10 %
Neutrophils Absolute: 2.4 10*3/uL (ref 1.4–7.0)
Neutrophils: 44 %
Platelets: 194 10*3/uL (ref 150–379)
RBC: 5.06 x10E6/uL (ref 4.14–5.80)
RDW: 13.8 % (ref 12.3–15.4)
WBC: 5.3 10*3/uL (ref 3.4–10.8)

## 2017-04-01 LAB — COMPREHENSIVE METABOLIC PANEL
ALT: 57 IU/L — ABNORMAL HIGH (ref 0–44)
AST: 39 IU/L (ref 0–40)
Albumin/Globulin Ratio: 1.8 (ref 1.2–2.2)
Albumin: 4.7 g/dL (ref 3.5–5.5)
Alkaline Phosphatase: 113 IU/L (ref 39–117)
BUN/Creatinine Ratio: 12 (ref 9–20)
BUN: 11 mg/dL (ref 6–24)
Bilirubin Total: 0.6 mg/dL (ref 0.0–1.2)
CO2: 26 mmol/L (ref 20–29)
Calcium: 9.7 mg/dL (ref 8.7–10.2)
Chloride: 103 mmol/L (ref 96–106)
Creatinine, Ser: 0.89 mg/dL (ref 0.76–1.27)
GFR calc Af Amer: 111 mL/min/{1.73_m2} (ref >59)
GFR calc non Af Amer: 96 mL/min/{1.73_m2} (ref >59)
Globulin, Total: 2.6 g/dL (ref 1.5–4.5)
Glucose: 106 mg/dL — ABNORMAL HIGH (ref 65–99)
Potassium: 4.2 mmol/L (ref 3.5–5.2)
Sodium: 143 mmol/L (ref 134–144)
Total Protein: 7.3 g/dL (ref 6.0–8.5)

## 2017-04-01 NOTE — Telephone Encounter (Signed)
Tried to return call, no answer, will try again.

## 2017-04-01 NOTE — Telephone Encounter (Signed)
Randi with [employer]) would like to know if you wrote an order for this patient to have PT. Please call Randi at [phone] ext. [phone] if you have any questions.

## 2017-04-03 ENCOUNTER — Other Ambulatory Visit: Payer: Self-pay

## 2017-04-03 ENCOUNTER — Ambulatory Visit: Payer: Medicare Other | Attending: Nurse Practitioner | Admitting: Nurse Practitioner

## 2017-04-03 ENCOUNTER — Encounter: Payer: Self-pay | Admitting: Nurse Practitioner

## 2017-04-03 ENCOUNTER — Telehealth: Payer: Self-pay | Admitting: Family Medicine

## 2017-04-03 VITALS — BP 146/99 | HR 82 | Temp 98.0°F | Resp 16 | Ht 70.0 in | Wt 223.0 lb

## 2017-04-03 DIAGNOSIS — M545 Low back pain: Secondary | ICD-10-CM | POA: Insufficient documentation

## 2017-04-03 DIAGNOSIS — Z9181 History of falling: Secondary | ICD-10-CM | POA: Insufficient documentation

## 2017-04-03 DIAGNOSIS — M542 Cervicalgia: Secondary | ICD-10-CM | POA: Diagnosis present

## 2017-04-03 DIAGNOSIS — M5126 Other intervertebral disc displacement, lumbar region: Secondary | ICD-10-CM | POA: Insufficient documentation

## 2017-04-03 DIAGNOSIS — M79605 Pain in left leg: Secondary | ICD-10-CM | POA: Insufficient documentation

## 2017-04-03 DIAGNOSIS — Z79891 Long term (current) use of opiate analgesic: Secondary | ICD-10-CM | POA: Insufficient documentation

## 2017-04-03 DIAGNOSIS — E876 Hypokalemia: Secondary | ICD-10-CM | POA: Diagnosis not present

## 2017-04-03 DIAGNOSIS — G894 Chronic pain syndrome: Secondary | ICD-10-CM | POA: Diagnosis not present

## 2017-04-03 DIAGNOSIS — M47816 Spondylosis without myelopathy or radiculopathy, lumbar region: Secondary | ICD-10-CM | POA: Diagnosis not present

## 2017-04-03 DIAGNOSIS — K219 Gastro-esophageal reflux disease without esophagitis: Secondary | ICD-10-CM | POA: Diagnosis not present

## 2017-04-03 DIAGNOSIS — M79602 Pain in left arm: Secondary | ICD-10-CM | POA: Insufficient documentation

## 2017-04-03 DIAGNOSIS — D125 Benign neoplasm of sigmoid colon: Secondary | ICD-10-CM | POA: Insufficient documentation

## 2017-04-03 DIAGNOSIS — K621 Rectal polyp: Secondary | ICD-10-CM | POA: Diagnosis not present

## 2017-04-03 DIAGNOSIS — G8929 Other chronic pain: Secondary | ICD-10-CM

## 2017-04-03 DIAGNOSIS — M7918 Myalgia, other site: Secondary | ICD-10-CM

## 2017-04-03 DIAGNOSIS — Z79899 Other long term (current) drug therapy: Secondary | ICD-10-CM | POA: Insufficient documentation

## 2017-04-03 DIAGNOSIS — M5416 Radiculopathy, lumbar region: Secondary | ICD-10-CM

## 2017-04-03 DIAGNOSIS — M25552 Pain in left hip: Secondary | ICD-10-CM | POA: Diagnosis not present

## 2017-04-03 DIAGNOSIS — Z87828 Personal history of other (healed) physical injury and trauma: Secondary | ICD-10-CM | POA: Insufficient documentation

## 2017-04-03 DIAGNOSIS — Z1211 Encounter for screening for malignant neoplasm of colon: Secondary | ICD-10-CM | POA: Insufficient documentation

## 2017-04-03 DIAGNOSIS — M4802 Spinal stenosis, cervical region: Secondary | ICD-10-CM | POA: Insufficient documentation

## 2017-04-03 DIAGNOSIS — M79601 Pain in right arm: Secondary | ICD-10-CM | POA: Diagnosis not present

## 2017-04-03 DIAGNOSIS — M533 Sacrococcygeal disorders, not elsewhere classified: Secondary | ICD-10-CM

## 2017-04-03 DIAGNOSIS — D122 Benign neoplasm of ascending colon: Secondary | ICD-10-CM | POA: Insufficient documentation

## 2017-04-03 DIAGNOSIS — M5481 Occipital neuralgia: Secondary | ICD-10-CM | POA: Diagnosis not present

## 2017-04-03 DIAGNOSIS — M4722 Other spondylosis with radiculopathy, cervical region: Secondary | ICD-10-CM | POA: Insufficient documentation

## 2017-04-03 DIAGNOSIS — N189 Chronic kidney disease, unspecified: Secondary | ICD-10-CM | POA: Diagnosis not present

## 2017-04-03 DIAGNOSIS — I129 Hypertensive chronic kidney disease with stage 1 through stage 4 chronic kidney disease, or unspecified chronic kidney disease: Secondary | ICD-10-CM | POA: Insufficient documentation

## 2017-04-03 MED ORDER — OXYCODONE HCL 10 MG PO TABS: 10.0000 mg | ORAL_TABLET | Freq: Four times a day (QID) | ORAL | 0 refills | Status: DC | PRN

## 2017-04-03 MED ORDER — NONFORMULARY OR COMPOUNDED ITEM: 2 refills | Status: DC

## 2017-04-03 NOTE — Patient Instructions (Addendum)
____________________________________________________________________________________________  Medication Rules  Applies to: All patients receiving prescriptions (written or electronic).  Pharmacy of record: Pharmacy where electronic prescriptions will be sent. If written prescriptions are taken to a different pharmacy, please inform the nursing staff. The pharmacy listed in the electronic medical record should be the one where you would like electronic prescriptions to be sent.  Prescription refills: Only during scheduled appointments. Applies to both, written and electronic prescriptions.  NOTE: The following applies primarily to controlled substances (Opioid* Pain Medications).   Patient's responsibilities: 1. Pain Pills: Bring all pain pills to every appointment (except for procedure appointments). 2. Pill Bottles: Bring pills in original pharmacy bottle. Always bring newest bottle. Bring bottle, even if empty. 3. Medication refills: You are responsible for knowing and keeping track of what medications you need refilled. The day before your appointment, write a list of all prescriptions that need to be refilled. Bring that list to your appointment and give it to the admitting nurse. Prescriptions will be written only during appointments. If you forget a medication, it will not be "Called in", "Faxed", or "electronically sent". You will need to get another appointment to get these prescribed. 4. Prescription Accuracy: You are responsible for carefully inspecting your prescriptions before leaving our office. Have the discharge nurse carefully go over each prescription with you, before taking them home. Make sure that your name is accurately spelled, that your address is correct. Check the name and dose of your medication to make sure it is accurate. Check the number of pills, and the written instructions to make sure they are clear and accurate. Make sure that you are given enough medication to  last until your next medication refill appointment. 5. Taking Medication: Take medication as prescribed. Never take more pills than instructed. Never take medication more frequently than prescribed. Taking less pills or less frequently is permitted and encouraged, when it comes to controlled substances (written prescriptions).  6. Inform other Doctors: Always inform, all of your healthcare providers, of all the medications you take. 7. Pain Medication from other Providers: You are not allowed to accept any additional pain medication from any other Doctor or Healthcare provider. There are two exceptions to this rule. (see below) In the event that you require additional pain medication, you are responsible for notifying us, as stated below. 8. Medication Agreement: You are responsible for carefully reading and following our Medication Agreement. This must be signed before receiving any prescriptions from our practice. Safely store a copy of your signed Agreement. Violations to the Agreement will result in no further prescriptions. (Additional copies of our Medication Agreement are available upon request.) 9. Laws, Rules, & Regulations: All patients are expected to follow all Federal and State Laws, Statutes, Rules, & Regulations. Ignorance of the Laws does not constitute a valid excuse. The use of any illegal substances is prohibited. 10. Adopted CDC guidelines & recommendations: Target dosing levels will be at or below 60 MME/day. Use of benzodiazepines** is not recommended.  Exceptions: There are only two exceptions to the rule of not receiving pain medications from other Healthcare Providers. 1. Exception #1 (Emergencies): In the event of an emergency (i.e.: accident requiring emergency care), you are allowed to receive additional pain medication. However, you are responsible for: As soon as you are able, call our office [phone], at any time of the day or night, and leave a message stating your  name, the date and nature of the emergency, and the name and dose of the medication   prescribed. In the event that your call is answered by a member of our staff, make sure to document and save the date, time, and the name of the person that took your information.  2. Exception #2 (Planned Surgery): In the event that you are scheduled by another doctor or dentist to have any type of surgery or procedure, you are allowed (for a period no longer than 30 days), to receive additional pain medication, for the acute post-op pain. However, in this case, you are responsible for picking up a copy of our "Post-op Pain Management for Surgeons" handout, and giving it to your surgeon or dentist. This document is available at our office, and does not require an appointment to obtain it. Simply go to our office during business hours (Monday-Thursday from 8:00 AM to 4:00 PM) (Friday 8:00 AM to 12:00 Noon) or if you have a scheduled appointment with [place], prior to your surgery, and ask for it by name. In addition, you will need to provide [place] with your name, name of your surgeon, type of surgery, and date of procedure or surgery.  *Opioid medications include: morphine, codeine, oxycodone, oxymorphone, hydrocodone, hydromorphone, meperidine, tramadol, tapentadol, buprenorphine, fentanyl, methadone. **Benzodiazepine medications include: diazepam (Valium), alprazolam (Xanax), clonazepam (Klonopine), lorazepam (Ativan), clorazepate (Tranxene), chlordiazepoxide (Librium), estazolam (Prosom), oxazepam (Serax), temazepam (Restoril), triazolam (Halcion)  ____________________________________________________________________________________________  BMI Assessment: Estimated body mass index is 32 kg/m as calculated from the following:   Height as of this encounter: 5\' 10"  (1.778 m).   Weight as of this encounter: 223 lb (101.2 kg).  BMI interpretation table: BMI level Category Range association with higher incidence of chronic pain   <18 kg/m2 Underweight   18.5-24.9 kg/m2 Ideal body weight   25-29.9 kg/m2 Overweight Increased incidence by 20%  30-34.9 kg/m2 Obese (Class I) Increased incidence by 68%  35-39.9 kg/m2 Severe obesity (Class II) Increased incidence by 136%  >40 kg/m2 Extreme obesity (Class III) Increased incidence by 254%   BMI Readings from Last 4 Encounters:  04/03/17 32.00 kg/m  03/31/17 32.72 kg/m  01/08/17 31.57 kg/m  12/02/16 32.00 kg/m   Wt Readings from Last 4 Encounters:  04/03/17 223 lb (101.2 kg)  03/31/17 228 lb 1 oz (103.4 kg)  01/08/17 220 lb (99.8 kg)  12/02/16 223 lb (101.2 kg)  ____________________________________________________________________________________________  Pain Scale  Introduction: The pain score used by this practice is the Verbal Numerical Rating Scale (VNRS-11). This is an 11-point scale. It is for adults and children 10 years or older. There are significant differences in how the pain score is reported, used, and applied. Forget everything you learned in the past and learn this scoring system.  General Information: The scale should reflect your current level of pain. Unless you are specifically asked for the level of your worst pain, or your average pain. If you are asked for one of these two, then it should be understood that it is over the past 24 hours.  Basic Activities of Daily Living (ADL): Personal hygiene, dressing, eating, transferring, and using restroom.  Instructions: Most patients tend to report their level of pain as a combination of two factors, their physical pain and their psychosocial pain. This last one is also known as "suffering" and it is reflection of how physical pain affects you socially and psychologically. From now on, report them separately. From this point on, when asked to report your pain level, report only your physical pain. Use the following table for reference.  Pain Clinic Pain Levels (0-5/10)  Pain Level Score  Description   No Pain 0   Mild pain 1 Nagging, annoying, but does not interfere with basic activities of daily living (ADL). Patients are able to eat, bathe, get dressed, toileting (being able to get on and off the toilet and perform personal hygiene functions), transfer (move in and out of bed or a chair without assistance), and maintain continence (able to control bladder and bowel functions). Blood pressure and heart rate are unaffected. A normal heart rate for a healthy adult ranges from 60 to 100 bpm (beats per minute).   Mild to moderate pain 2 Noticeable and distracting. Impossible to hide from other people. More frequent flare-ups. Still possible to adapt and function close to normal. It can be very annoying and may have occasional stronger flare-ups. With discipline, patients may get used to it and adapt.   Moderate pain 3 Interferes significantly with activities of daily living (ADL). It becomes difficult to feed, bathe, get dressed, get on and off the toilet or to perform personal hygiene functions. Difficult to get in and out of bed or a chair without assistance. Very distracting. With effort, it can be ignored when deeply involved in activities.   Moderately severe pain 4 Impossible to ignore for more than a few minutes. With effort, patients may still be able to manage work or participate in some social activities. Very difficult to concentrate. Signs of autonomic nervous system discharge are evident: dilated pupils (mydriasis); mild sweating (diaphoresis); sleep interference. Heart rate becomes elevated (>115 bpm). Diastolic blood pressure (lower number) rises above 100 mmHg. Patients find relief in laying down and not moving.   Severe pain 5 Intense and extremely unpleasant. Associated with frowning face and frequent crying. Pain overwhelms the senses.  Ability to do any activity or maintain social relationships becomes significantly limited. Conversation becomes difficult. Pacing back and forth is  common, as getting into a comfortable position is nearly impossible. Pain wakes you up from deep sleep. Physical signs will be obvious: pupillary dilation; increased sweating; goosebumps; brisk reflexes; cold, clammy hands and feet; nausea, vomiting or dry heaves; loss of appetite; significant sleep disturbance with inability to fall asleep or to remain asleep. When persistent, significant weight loss is observed due to the complete loss of appetite and sleep deprivation.  Blood pressure and heart rate becomes significantly elevated. Caution: If elevated blood pressure triggers a pounding headache associated with blurred vision, then the patient should immediately seek attention at an urgent or emergency care unit, as these may be signs of an impending stroke.    Emergency Department Pain Levels (6-10/10)  Emergency Room Pain 6 Severely limiting. Requires emergency care and should not be seen or managed at an outpatient pain management facility. Communication becomes difficult and requires great effort. Assistance to reach the emergency department may be required. Facial flushing and profuse sweating along with potentially dangerous increases in heart rate and blood pressure will be evident.   Distressing pain 7 Self-care is very difficult. Assistance is required to transport, or use restroom. Assistance to reach the emergency department will be required. Tasks requiring coordination, such as bathing and getting dressed become very difficult.   Disabling pain 8 Self-care is no longer possible. At this level, pain is disabling. The individual is unable to do even the most "basic" activities such as walking, eating, bathing, dressing, transferring to a bed, or toileting. Fine motor skills are lost. It is difficult to think clearly.   Incapacitating pain 9 Pain becomes incapacitating.  Thought processing is no longer possible. Difficult to remember your own name. Control of movement and coordination are  lost.   The worst pain imaginable 10 At this level, most patients pass out from pain. When this level is reached, collapse of the autonomic nervous system occurs, leading to a sudden drop in blood pressure and heart rate. This in turn results in a temporary and dramatic drop in blood flow to the brain, leading to a loss of consciousness. Fainting is one of the body's self defense mechanisms. Passing out puts the brain in a calmed state and causes it to shut down for a while, in order to begin the healing process.    Summary: 1. Refer to this scale when providing [place] with your pain level. 2. Be accurate and careful when reporting your pain level. This will help with your care. 3. Over-reporting your pain level will lead to loss of credibility. 4. Even a level of 1/10 means that there is pain and will be treated at our facility. 5. High, inaccurate reporting will be documented as "Symptom Exaggeration", leading to loss of credibility and suspicions of possible secondary gains such as obtaining more narcotics, or wanting to appear disabled, for fraudulent reasons. 6. Only pain levels of 5 or below will be seen at our facility. 7. Pain levels of 6 and above will be sent to the Emergency Department and the appointment cancelled. ____________________________________________________________________________________________

## 2017-04-03 NOTE — Progress Notes (Signed)
Nursing Pain Medication Assessment:  Safety precautions to be maintained throughout the outpatient stay will include: orient to surroundings, keep bed in low position, maintain call bell within reach at all times, provide assistance with transfer out of bed and ambulation.  Medication Inspection Compliance: Pill count conducted under aseptic conditions, in front of the patient. Neither the pills nor the bottle was removed from the patient's sight at any time. Once count was completed pills were immediately returned to the patient in their original bottle.  Medication: oxycodone 10 mg Pill/Patch Count: 7 of 120 pills remain Pill/Patch Appearance: Markings consistent with prescribed medication Bottle Appearance: Standard pharmacy container. Clearly labeled. Filled Date: 1 / 2 / 2019 Last Medication intake:  Today

## 2017-04-03 NOTE — Progress Notes (Signed)
Patient's Name: Kristopher [name]  MRN: [id number]  Referring Provider: [name], DO  DOB: 1961-02-21  PCP: [name], DO  DOS: 04/03/2017  Note by: Vevelyn Francois NP  Service setting: Ambulatory outpatient  Specialty: Interventional Pain Management  Location: [facility] (AMB) Pain Management Facility    Patient type: Established    Primary Reason(s) for Visit: Encounter for prescription drug management. (Level of risk: moderate)  CC: Hip Pain; Back Pain; Neck Pain; and Leg Pain (left)  HPI  Kristopher [name] is a 57 y.o. year old, male patient, who comes today for a medication management evaluation. He has Chronic low back pain (Location of Primary Source of Pain) (Bilateral) (L>R); Lumbar spondylosis; Chronic lumbar radicular pain (Location of Secondary source of pain) (S1 dermatomal) (Left); 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); Chronic neck pain (posterior midline) (Bilateral) (L>R); Cervical spondylosis; Chronic cervical radicular pain (Location of Tertiary source of pain) (Bilateral) (C5/C6 dermatome) (L>R); Long term current use of opiate analgesic; Long term prescription opiate use; Opiate use (60 MME/Day); Opioid dependence ([place]); Encounter for therapeutic drug level monitoring; Cervical spinal stenosis (C4-5); Cervical foraminal stenosis (Bilateral C5-6); Hypokalemia; Retrolisthesis of L5-S1; Cervical disc herniation (C4-5 and C5-6); Lumbar disc herniation (L5-S1); Chronic sacroiliac joint pain (Bilateral) (L>R); Allergy history, anesthetic (Unconfirmed allergy to Lidocaine); Chronic hip pain (Left); Lumbar facet syndrome (Location of Primary Source of Pain) (Bilateral) (L>R); Chronic lower extremity pain (Location of Secondary source of pain) (Left); Chronic upper extremity pain (Location of Tertiary source of pain) (Bilateral) (L>R); Greater occipital neuralgia (Right); Chronic pain syndrome; Benign hypertensive renal disease; Migraine; Special screening for  malignant neoplasms, colon; Benign neoplasm of ascending colon; Polyp of sigmoid colon; Rectal polyp; Tobacco abuse; GERD (gastroesophageal reflux disease); and Musculoskeletal pain, chronic on their problem list. His primarily concern today is the Hip Pain; Back Pain; Neck Pain; and Leg Pain (left)  Pain Assessment: Location: Left Back Radiating: travels to left hip down left leg to toes Onset: More than a month ago Duration: Chronic pain Quality: Stabbing, Numbness(grinding) Severity: 6 /10 (self-reported pain score)  Note: Reported level is compatible with observation. Clinically the patient looks like a 2/10 A 2/10 is viewed as "Mild to Moderate" and described as noticeable and distracting. Impossible to hide from other people. More frequent flare-ups. Still possible to adapt and function close to normal. It can be very annoying and may have occasional stronger flare-ups. With discipline, patients may get used to it and adapt. Information on the proper use of the pain scale provided to the patient today. When using our objective Pain Scale, levels between 6 and 10/10 are said to belong in an emergency room, as it progressively worsens from a 6/10, described as severely limiting, requiring emergency care not usually available at an outpatient pain management facility. At a 6/10 level, communication becomes difficult and requires great effort. Assistance to reach the emergency department may be required. Facial flushing and profuse sweating along with potentially dangerous increases in heart rate and blood pressure will be evident. Effect on ADL: cannot tie shoes anymore, falling lately because of pain in leg; doesn't leave house unless he has to Timing: Constant Modifying factors: medications  Kristopher [name] was last scheduled for an appointment on 01/08/2017 for medication management. During today's appointment we reviewed Mr. Radle chronic pain status, as well as his outpatient medication regimen.He  states that his pain is stable. He admits that cold weather does make the pain. He admits that the topical agent  is effective for treating his pain so that he does nothave to take additional pain medication.   The patient  reports that he does not use drugs. His body mass index is 32 kg/m.  Further details on both, my assessment(s), as well as the proposed treatment plan, please see below.  Controlled Substance Pharmacotherapy Assessment REMS (Risk Evaluation and Mitigation Strategy)  Analgesic:Oxycodone IR 10 mg every 6 hours (40 mg/day of oxycodone) MME/day:60 mg/day    Rise Patience  04/03/2017  9:10 AM  Sign at close encounter Nursing Pain Medication Assessment:  Safety precautions to be maintained throughout the outpatient stay will include: orient to surroundings, keep bed in low position, maintain call bell within reach at all times, provide assistance with transfer out of bed and ambulation.  Medication Inspection Compliance: Pill count conducted under aseptic conditions, in front of the patient. Neither the pills nor the bottle was removed from the patient's sight at any time. Once count was completed pills were immediately returned to the patient in their original bottle.  Medication: oxycodone 10 mg Pill/Patch Count: 7 of 120 pills remain Pill/Patch Appearance: Markings consistent with prescribed medication Bottle Appearance: Standard pharmacy container. Clearly labeled. Filled Date: 1 / 2 / 2019 Last Medication intake:  Today   Pharmacokinetics: Liberation and absorption (onset of action): WNL Distribution (time to peak effect): WNL Metabolism and excretion (duration of action): WNL         Pharmacodynamics: Desired effects: Analgesia: Mr. Hicks reports >50% benefit. Functional ability: Patient reports that medication allows him to accomplish basic ADLs Clinically meaningful improvement in function (CMIF): Sustained CMIF goals met Perceived effectiveness: Described as  relatively effective, allowing for increase in activities of daily living (ADL) Undesirable effects: Side-effects or Adverse reactions: None reported Monitoring: [place] PMP: Online review of the past 75-monthperiod conducted. Compliant with practice rules and regulations Last UDS on record: Summary  Date Value Ref Range Status  01/08/2017 FINAL  Final    Comment:    ==================================================================== TOXASSURE SELECT 13 (MW) ==================================================================== Test                             Result       Flag       Units Drug Present and Declared for Prescription Verification   Oxycodone                      492          EXPECTED   ng/mg creat   Oxymorphone                    636          EXPECTED   ng/mg creat   Noroxycodone                   765          EXPECTED   ng/mg creat   Noroxymorphone                 157          EXPECTED   ng/mg creat    Sources of oxycodone are scheduled prescription medications.    Oxymorphone, noroxycodone, and noroxymorphone are expected    metabolites of oxycodone. Oxymorphone is also available as a    scheduled prescription medication. ==================================================================== Test  Result    Flag   Units      Ref Range   Creatinine              239              mg/dL      >=20 ==================================================================== Declared Medications:  The flagging and interpretation on this report are based on the  following declared medications.  Unexpected results may arise from  inaccuracies in the declared medications.  **Note: The testing scope of this panel includes these medications:  Oxycodone  **Note: The testing scope of this panel does not include following  reported medications:  Albuterol  Amlodipine (Exforge)  Doxycycline  Erythromycin  Hydrochlorothiazide  Metoprolol  Naloxone  Nortriptyline   Omeprazole  Potassium (Klor-Con)  Prednisone  Sumatriptan  Valsartan (Exforge) ==================================================================== For clinical consultation, please call [phone]. ====================================================================    UDS interpretation: Compliant          Medication Assessment Form: Reviewed. Patient indicates being compliant with therapy Treatment compliance: Compliant Risk Assessment Profile: Aberrant behavior: See prior evaluations. None observed or detected today Comorbid factors increasing risk of overdose: See prior notes. No additional risks detected today Risk of substance use disorder (SUD): Low Opioid Risk Tool - 04/03/17 0904      Family History of Substance Abuse   Alcohol  Negative    Illegal Drugs  Negative    Rx Drugs  Negative      Personal History of Substance Abuse   Alcohol  Negative    Illegal Drugs  Negative    Rx Drugs  Negative      Age   Age between 66-45 years   No      History of Preadolescent Sexual Abuse   History of Preadolescent Sexual Abuse  Negative or Male      Psychological Disease   Psychological Disease  Negative    Depression  Negative      Total Score   Opioid Risk Tool Scoring  0    Opioid Risk Interpretation  Low Risk      ORT Scoring interpretation table:  Score <3 = Low Risk for SUD  Score between 4-7 = Moderate Risk for SUD  Score >8 = High Risk for Opioid Abuse   Risk Mitigation Strategies:  Patient Counseling: Covered Patient-Prescriber Agreement (PPA): Present and active  Notification to other healthcare providers: Done  Pharmacologic Plan: No change in therapy, at this time.             Laboratory Chemistry  Inflammation Markers (CRP: Acute Phase) (ESR: Chronic Phase) Lab Results  Component Value Date   CRP 0.7 04/03/2015   ESRSEDRATE 11 04/03/2015                 Rheumatology Markers No results found for: RF, ANA, Therisa Doyne,  [facility]              Renal Function Markers Lab Results  Component Value Date   BUN 11 03/31/2017   CREATININE 0.89 03/31/2017   GFRAA 111 03/31/2017   GFRNONAA 96 03/31/2017                 Hepatic Function Markers Lab Results  Component Value Date   AST 39 03/31/2017   ALT 57 (H) 03/31/2017   ALBUMIN 4.7 03/31/2017   ALKPHOS 113 03/31/2017                 Electrolytes Lab Results  Component Value  Date   NA 143 03/31/2017   K 4.2 03/31/2017   CL 103 03/31/2017   CALCIUM 9.7 03/31/2017   MG 2.0 04/03/2015                 Neuropathy Markers Lab Results  Component Value Date   HIV Non Reactive 06/24/2016                 Bone Pathology Markers No results found for: Forsan, [id number], [id number], DV7616WV3, 25OHVITD1, 25OHVITD2, 25OHVITD3, TESTOFREE, TESTOSTERONE               Coagulation Parameters Lab Results  Component Value Date   PLT 194 03/31/2017                 Cardiovascular Markers Lab Results  Component Value Date   HGB 14.9 03/31/2017   HCT 45.1 03/31/2017                 CA Markers No results found for: CEA, CA125, LABCA2               Note: Lab results reviewed.  Recent Diagnostic Imaging Results  DG HIP UNILAT W OR W/O PELVIS 2-3 VIEWS LEFT CLINICAL DATA:  [place] 28 foot off a roof in 2011, chronic low back pain radiating to LEFT hip since, multiple back surgeries since fall  EXAM: DG HIP (WITH OR WITHOUT PELVIS) 2-3V LEFT  COMPARISON:  Non  FINDINGS: Osseous mineralization grossly normal.  Prior L5-S1 fusion.  SI joints symmetric and preserved.  Degenerative changes of both hip joints with joint space narrowing and spur formation greater on LEFT.  No acute fracture, dislocation, or bone destruction.  IMPRESSION: Degenerative changes of BILATERAL hip joints.  Electronically Signed   By: Lavonia Dana M.D.   On: 06/06/2015 13:14  Complexity Note: Imaging results reviewed. Results shared with Mr. Vanbergen, using Layman's  terms.                         Meds   Current Outpatient Medications:  .  albuterol (PROVENTIL HFA;VENTOLIN HFA) 108 (90 Base) MCG/ACT inhaler, Inhale 2 puffs into the lungs every 6 (six) hours as needed for wheezing or shortness of breath., Disp: 1 Inhaler, Rfl: 2 .  amLODipine-valsartan (EXFORGE) 10-320 MG tablet, Take 1 tablet by mouth daily., Disp: 90 tablet, Rfl: 1 .  baclofen (LIORESAL) 20 MG tablet, Take 1 tablet (20 mg total) by mouth at bedtime as needed for muscle spasms., Disp: 30 each, Rfl: 3 .  hydrochlorothiazide (HYDRODIURIL) 25 MG tablet, Take 1 tablet (25 mg total) by mouth daily., Disp: 90 tablet, Rfl: 3 .  Naloxone HCl (NARCAN) 4 MG/0.1ML LIQD, Place 1 Bottle into the nose once. , then call 911, repeat if needed in other nostril with new bottle., Disp: 2 each, Rfl: 0 .  omeprazole (PRILOSEC) 20 MG capsule, Take 1 capsule (20 mg total) by mouth daily., Disp: 30 capsule, Rfl: 3 .  [START ON 07/02/2017] Oxycodone HCl 10 MG TABS, Take 1 tablet (10 mg total) by mouth every 6 (six) hours as needed., Disp: 120 tablet, Rfl: 0 .  [START ON 06/02/2017] Oxycodone HCl 10 MG TABS, Take 1 tablet (10 mg total) by mouth every 6 (six) hours as needed., Disp: 120 tablet, Rfl: 0 .  SUMAtriptan (IMITREX) 100 MG tablet, Take 1 tablet earliest onset of headache.  May repeat once after 2 hours if headache persists or recurs.  Do not exceed 2  tablets in 24 hrs, Disp: 10 tablet, Rfl: 12 .  NONFORMULARY OR COMPOUNDED ITEM, 10% Ketamine/2% Cyclobenzaprine/6% Gabapentin Cream Sig: 1-2 ml to affected area 3-4 times/day. Amount: 240 GM, Disp: 240 each, Rfl: 2 .  [START ON 05/03/2017] Oxycodone HCl 10 MG TABS, Take 1 tablet (10 mg total) by mouth every 6 (six) hours as needed., Disp: 120 tablet, Rfl: 0  ROS  Constitutional: Denies any fever or chills Gastrointestinal: No reported hemesis, hematochezia, vomiting, or acute GI distress Musculoskeletal: Denies any acute onset joint swelling, redness, loss of ROM, or  weakness Neurological: No reported episodes of acute onset apraxia, aphasia, dysarthria, agnosia, amnesia, paralysis, loss of coordination, or loss of consciousness  Allergies  Mr. Puig is allergic to lidocaine; penicillins; strawberry (diagnostic); aspirin; and novocain [procaine].  [place]  Drug: Mr. Valvano  reports that he does not use drugs. Alcohol:  reports that he does not drink alcohol. Tobacco:  reports that he has been smoking cigarettes.  He has a 26.25 pack-year smoking history. he has never used smokeless tobacco. Medical:  has a past medical history of Allergy, Arthritis, Chronic back pain, Hypertension, and Migraines. Surgical: Mr. Kestler  has a past surgical history that includes Back surgery; Appendectomy; Leg Surgery; Knee surgery (Left); Foot surgery (Left); Colonoscopy with propofol (N/A, 02/19/2016); and polypectomy (N/A, 02/19/2016). Family: family history includes Cancer in his father; Diabetes in his sister; Thrombosis in his sister.  Constitutional Exam  General appearance: Well nourished, well developed, and well hydrated. In no apparent acute distress Vitals:   04/03/17 0854  BP: (!) 146/99  Pulse: 82  Resp: 16  Temp: 98 F (36.7 C)  TempSrc: Oral  SpO2: 99%  Weight: 223 lb (101.2 kg)  Height: 5' 10"  (1.778 m)   Psych/Mental status: Alert, oriented x 3 (person, place, & time)       Eyes: PERLA Respiratory: No evidence of acute respiratory distress  Cervical Spine Area Exam  Skin & Axial Inspection: No masses, redness, edema, swelling, or associated skin lesions Alignment: Symmetrical Functional ROM: Unrestricted ROM      Stability: No instability detected Muscle Tone/Strength: Functionally intact. No obvious neuro-muscular anomalies detected. Sensory (Neurological): Unimpaired Palpation: No palpable anomalies              Upper Extremity (UE) Exam    Side: Right upper extremity  Side: Left upper extremity  Skin & Extremity Inspection: Skin color,  temperature, and hair growth are WNL. No peripheral edema or cyanosis. No masses, redness, swelling, asymmetry, or associated skin lesions. No contractures.  Skin & Extremity Inspection: Skin color, temperature, and hair growth are WNL. No peripheral edema or cyanosis. No masses, redness, swelling, asymmetry, or associated skin lesions. No contractures.  Functional ROM: Unrestricted ROM          Functional ROM: Unrestricted ROM          Muscle Tone/Strength: Functionally intact. No obvious neuro-muscular anomalies detected.  Muscle Tone/Strength: Functionally intact. No obvious neuro-muscular anomalies detected.  Sensory (Neurological): Unimpaired          Sensory (Neurological): Unimpaired          Palpation: No palpable anomalies              Palpation: No palpable anomalies              Specialized Test(s): Deferred         Specialized Test(s): Deferred          Thoracic Spine Area Exam  Skin &  Axial Inspection: No masses, redness, or swelling Alignment: Symmetrical Functional ROM: Unrestricted ROM Stability: No instability detected Muscle Tone/Strength: Functionally intact. No obvious neuro-muscular anomalies detected. Sensory (Neurological): Unimpaired Muscle strength & Tone: No palpable anomalies  Lumbar Spine Area Exam  Skin & Axial Inspection: Well healed scar from previous spine surgery detected Alignment: Symmetrical Functional ROM: Unrestricted ROM      Stability: No instability detected Muscle Tone/Strength: Functionally intact. No obvious neuro-muscular anomalies detected. Sensory (Neurological): Unimpaired Palpation: Tender       Provocative Tests: Lumbar Hyperextension and rotation test: evaluation deferred today       Lumbar Lateral bending test: evaluation deferred today       Patrick's Maneuver: evaluation deferred today                    Gait & Posture Assessment  Ambulation: Unassisted Gait: Relatively normal for age and body habitus Posture: WNL   Lower  Extremity Exam    Side: Right lower extremity  Side: Left lower extremity  Skin & Extremity Inspection: Skin color, temperature, and hair growth are WNL. No peripheral edema or cyanosis. No masses, redness, swelling, asymmetry, or associated skin lesions. No contractures.  Skin & Extremity Inspection: Skin color, temperature, and hair growth are WNL. No peripheral edema or cyanosis. No masses, redness, swelling, asymmetry, or associated skin lesions. No contractures.  Functional ROM: Unrestricted ROM          Functional ROM: Unrestricted ROM          Muscle Tone/Strength: Functionally intact. No obvious neuro-muscular anomalies detected.  Muscle Tone/Strength: Functionally intact. No obvious neuro-muscular anomalies detected.  Sensory (Neurological): Unimpaired  Sensory (Neurological): Unimpaired  Palpation: No palpable anomalies  Palpation: No palpable anomalies   Assessment  Primary Diagnosis & Pertinent Problem List: The primary encounter diagnosis was Chronic lumbar radicular pain (Location of Secondary source of pain) (S1 dermatomal) (Left). Diagnoses of Chronic sacroiliac joint pain (Bilateral) (L>R), Musculoskeletal pain, chronic, Chronic hip pain (Left), and Chronic pain syndrome were also pertinent to this visit.  Status Diagnosis  Controlled Controlled Controlled 1. Chronic lumbar radicular pain (Location of Secondary source of pain) (S1 dermatomal) (Left)   2. Chronic sacroiliac joint pain (Bilateral) (L>R)   3. Musculoskeletal pain, chronic   4. Chronic hip pain (Left)   5. Chronic pain syndrome     Problems updated and reviewed during this visit: Problem  Musculoskeletal Pain, Chronic   Plan of Care  Pharmacotherapy (Medications Ordered): Meds ordered this encounter  Medications  . Oxycodone HCl 10 MG TABS    Sig: Take 1 tablet (10 mg total) by mouth every 6 (six) hours as needed.    Dispense:  120 tablet    Refill:  0    Do not place this medication, or any other  prescription from our practice, on "Automatic Refill". Patient may have prescription filled one day early if pharmacy is closed on scheduled refill date. Do not fill until:07/02/2017 To last until: 08/01/2017    Order Specific Question:   Supervising Provider    Answer:   [name] [phone]  . Oxycodone HCl 10 MG TABS    Sig: Take 1 tablet (10 mg total) by mouth every 6 (six) hours as needed.    Dispense:  120 tablet    Refill:  0    Do not place this medication, or any other prescription from our practice, on "Automatic Refill". Patient may have prescription filled one day early if pharmacy is closed  on scheduled refill date. Do not fill until:06/02/2017 To last until: 07/02/2017    Order Specific Question:   Supervising Provider    Answer:   [name] [phone]  . Oxycodone HCl 10 MG TABS    Sig: Take 1 tablet (10 mg total) by mouth every 6 (six) hours as needed.    Dispense:  120 tablet    Refill:  0    Do not place this medication, or any other prescription from our practice, on "Automatic Refill". Patient may have prescription filled one day early if pharmacy is closed on scheduled refill date. Do not fill until: 05/03/2017 To last until:06/02/2017    Order Specific Question:   Supervising Provider    Answer:   [name] [phone]  . NONFORMULARY OR COMPOUNDED ITEM    Sig: 10% Ketamine/2% Cyclobenzaprine/6% Gabapentin Cream Sig: 1-2 ml to affected area 3-4 times/day. Amount: 240 GM    Dispense:  240 each    Refill:  2    Order Specific Question:   Supervising Provider    AnswerMilinda Pointer [phone]   New Prescriptions   No medications on file   Medications administered today: Bertin Inabinet had no medications administered during this visit. Lab-work, procedure(s), and/or referral(s): No orders of the defined types were placed in this encounter.  Imaging and/or referral(s): None  Interventional therapies: Planned, scheduled, and/or  pending: Not at this time.   Considering: None at this time   Palliative PRN treatment(s): None at this time  Provider-requested follow-up: Return in 4 months (on 07/23/2017) for MedMgmt with Me Donella Stade [facility] Pace).  Future Appointments  Date Time Provider [place]  04/25/2017  3:00 PM Minna Merritts, MD [id number]  07/21/2017  8:30 AM Vevelyn Francois, NP [facility] None   Primary Care Physician: [name], DO Location: [facility] Outpatient [facility] Management Facility Note by: Vevelyn Francois NP Date: 04/03/2017; Time: 9:59 AM  Pain Score Disclaimer: We use the NRS-11 scale. This is a self-reported, subjective measurement of pain severity with only modest accuracy. It is used primarily to identify changes within a particular patient. It must be understood that outpatient pain scales are significantly less accurate that those used for research, where they can be applied under ideal controlled circumstances with minimal exposure to variables. In reality, the score is likely to be a combination of pain intensity and pain affect, where pain affect describes the degree of emotional arousal or changes in action readiness caused by the sensory experience of pain. Factors such as social and work situation, setting, emotional state, anxiety levels, expectation, and prior pain experience may influence pain perception and show large inter-individual differences that may also be affected by time variables.  Patient instructions provided during this appointment: Patient Instructions    ____________________________________________________________________________________________  Medication Rules  Applies to: All patients receiving prescriptions (written or electronic).  Pharmacy of record: Pharmacy where electronic prescriptions will be sent. If written prescriptions are taken to a different pharmacy, please inform the nursing staff. The pharmacy listed in the electronic  medical record should be the one where you would like electronic prescriptions to be sent.  Prescription refills: Only during scheduled appointments. Applies to both, written and electronic prescriptions.  NOTE: The following applies primarily to controlled substances (Opioid* Pain Medications).   Patient's responsibilities: 1. Pain Pills: Bring all pain pills to every appointment (except for procedure appointments). 2. Pill Bottles: Bring pills in original pharmacy bottle. Always bring newest bottle. Bring bottle, even  if empty. 3. Medication refills: You are responsible for knowing and keeping track of what medications you need refilled. The day before your appointment, write a list of all prescriptions that need to be refilled. Bring that list to your appointment and give it to the admitting nurse. Prescriptions will be written only during appointments. If you forget a medication, it will not be "Called in", "Faxed", or "electronically sent". You will need to get another appointment to get these prescribed. 4. Prescription Accuracy: You are responsible for carefully inspecting your prescriptions before leaving our office. Have the discharge nurse carefully go over each prescription with you, before taking them home. Make sure that your name is accurately spelled, that your address is correct. Check the name and dose of your medication to make sure it is accurate. Check the number of pills, and the written instructions to make sure they are clear and accurate. Make sure that you are given enough medication to last until your next medication refill appointment. 5. Taking Medication: Take medication as prescribed. Never take more pills than instructed. Never take medication more frequently than prescribed. Taking less pills or less frequently is permitted and encouraged, when it comes to controlled substances (written prescriptions).  6. Inform other Doctors: Always inform, all of your healthcare  providers, of all the medications you take. 7. Pain Medication from other Providers: You are not allowed to accept any additional pain medication from any other Doctor or Healthcare provider. There are two exceptions to this rule. (see below) In the event that you require additional pain medication, you are responsible for notifying us, as stated below. 8. Medication Agreement: You are responsible for carefully reading and following our Medication Agreement. This must be signed before receiving any prescriptions from our practice. Safely store a copy of your signed Agreement. Violations to the Agreement will result in no further prescriptions. (Additional copies of our Medication Agreement are available upon request.) 9. Laws, Rules, & Regulations: All patients are expected to follow all Federal and [employer], [employer], Rules, [employer] [facility]. Ignorance of the Laws does not constitute a valid excuse. The use of any illegal substances is prohibited. 10. Adopted CDC guidelines & recommendations: Target dosing levels will be at or below 60 MME/day. Use of benzodiazepines** is not recommended.  Exceptions: There are only two exceptions to the rule of not receiving pain medications from other Healthcare Providers. 1. Exception #1 (Emergencies): In the event of an emergency (i.e.: accident requiring emergency care), you are allowed to receive additional pain medication. However, you are responsible for: As soon as you are able, call our office [phone], at any time of the day or night, and leave a message stating your name, the date and nature of the emergency, and the name and dose of the medication prescribed. In the event that your call is answered by a member of our staff, make sure to document and save the date, time, and the name of the person that took your information.  2. Exception #2 (Planned Surgery): In the event that you are scheduled by another doctor or dentist to have any type of surgery or  procedure, you are allowed (for a period no longer than 30 days), to receive additional pain medication, for the acute post-op pain. However, in this case, you are responsible for picking up a copy of our "Post-op Pain Management for Surgeons" handout, and giving it to your surgeon or dentist. This document is available at our office, and does not require  an appointment to obtain it. Simply go to our office during business hours (Monday-Thursday from 8:00 AM to 4:00 PM) (Friday 8:00 AM to 12:00 Noon) or if you have a scheduled appointment with [place], prior to your surgery, and ask for it by name. In addition, you will need to provide [place] with your name, name of your surgeon, type of surgery, and date of procedure or surgery.  *Opioid medications include: morphine, codeine, oxycodone, oxymorphone, hydrocodone, hydromorphone, meperidine, tramadol, tapentadol, buprenorphine, fentanyl, methadone. **Benzodiazepine medications include: diazepam (Valium), alprazolam (Xanax), clonazepam (Klonopine), lorazepam (Ativan), clorazepate (Tranxene), chlordiazepoxide (Librium), estazolam (Prosom), oxazepam (Serax), temazepam (Restoril), triazolam (Halcion)  ____________________________________________________________________________________________  BMI Assessment: Estimated body mass index is 32 kg/m as calculated from the following:   Height as of this encounter: '5\' 10"'$  (1.778 m).   Weight as of this encounter: 223 lb (101.2 kg).  BMI interpretation table: BMI level Category Range association with higher incidence of chronic pain  <18 kg/m2 Underweight   18.5-24.9 kg/m2 Ideal body weight   25-29.9 kg/m2 Overweight Increased incidence by 20%  30-34.9 kg/m2 Obese (Class I) Increased incidence by 68%  35-39.9 kg/m2 Severe obesity (Class II) Increased incidence by 136%  >40 kg/m2 Extreme obesity (Class III) Increased incidence by 254%   BMI Readings from Last 4 Encounters:  04/03/17 32.00 kg/m  03/31/17 32.72  kg/m  01/08/17 31.57 kg/m  12/02/16 32.00 kg/m   Wt Readings from Last 4 Encounters:  04/03/17 223 lb (101.2 kg)  03/31/17 228 lb 1 oz (103.4 kg)  01/08/17 220 lb (99.8 kg)  12/02/16 223 lb (101.2 kg)  ____________________________________________________________________________________________  Pain Scale  Introduction: The pain score used by this practice is the Verbal Numerical Rating Scale (VNRS-11). This is an 11-point scale. It is for adults and children 10 years or older. There are significant differences in how the pain score is reported, used, and applied. Forget everything you learned in the past and learn this scoring system.  General Information: The scale should reflect your current level of pain. Unless you are specifically asked for the level of your worst pain, or your average pain. If you are asked for one of these two, then it should be understood that it is over the past 24 hours.  Basic Activities of Daily Living (ADL): Personal hygiene, dressing, eating, transferring, and using restroom.  Instructions: Most patients tend to report their level of pain as a combination of two factors, their physical pain and their psychosocial pain. This last one is also known as "suffering" and it is reflection of how physical pain affects you socially and psychologically. From now on, report them separately. From this point on, when asked to report your pain level, report only your physical pain. Use the following table for reference.  Pain Clinic Pain Levels (0-5/10)  Pain Level Score  Description  No Pain 0   Mild pain 1 Nagging, annoying, but does not interfere with basic activities of daily living (ADL). Patients are able to eat, bathe, get dressed, toileting (being able to get on and off the toilet and perform personal hygiene functions), transfer (move in and out of bed or a chair without assistance), and maintain continence (able to control bladder and bowel functions). Blood  pressure and heart rate are unaffected. A normal heart rate for a healthy adult ranges from 60 to 100 bpm (beats per minute).   Mild to moderate pain 2 Noticeable and distracting. Impossible to hide from other people. More frequent flare-ups. Still possible to adapt  and function close to normal. It can be very annoying and may have occasional stronger flare-ups. With discipline, patients may get used to it and adapt.   Moderate pain 3 Interferes significantly with activities of daily living (ADL). It becomes difficult to feed, bathe, get dressed, get on and off the toilet or to perform personal hygiene functions. Difficult to get in and out of bed or a chair without assistance. Very distracting. With effort, it can be ignored when deeply involved in activities.   Moderately severe pain 4 Impossible to ignore for more than a few minutes. With effort, patients may still be able to manage work or participate in some social activities. Very difficult to concentrate. Signs of autonomic nervous system discharge are evident: dilated pupils (mydriasis); mild sweating (diaphoresis); sleep interference. Heart rate becomes elevated (>115 bpm). Diastolic blood pressure (lower number) rises above 100 mmHg. Patients find relief in laying down and not moving.   Severe pain 5 Intense and extremely unpleasant. Associated with frowning face and frequent crying. Pain overwhelms the senses.  Ability to do any activity or maintain social relationships becomes significantly limited. Conversation becomes difficult. Pacing back and forth is common, as getting into a comfortable position is nearly impossible. Pain wakes you up from deep sleep. Physical signs will be obvious: pupillary dilation; increased sweating; goosebumps; brisk reflexes; cold, clammy hands and feet; nausea, vomiting or dry heaves; loss of appetite; significant sleep disturbance with inability to fall asleep or to remain asleep. When persistent, significant  weight loss is observed due to the complete loss of appetite and sleep deprivation.  Blood pressure and heart rate becomes significantly elevated. Caution: If elevated blood pressure triggers a pounding headache associated with blurred vision, then the patient should immediately seek attention at an urgent or emergency care unit, as these may be signs of an impending stroke.    Emergency Department Pain Levels (6-10/10)  Emergency Room Pain 6 Severely limiting. Requires emergency care and should not be seen or managed at an outpatient pain management facility. Communication becomes difficult and requires great effort. Assistance to reach the emergency department may be required. Facial flushing and profuse sweating along with potentially dangerous increases in heart rate and blood pressure will be evident.   Distressing pain 7 Self-care is very difficult. Assistance is required to transport, or use restroom. Assistance to reach the emergency department will be required. Tasks requiring coordination, such as bathing and getting dressed become very difficult.   Disabling pain 8 Self-care is no longer possible. At this level, pain is disabling. The individual is unable to do even the most "basic" activities such as walking, eating, bathing, dressing, transferring to a bed, or toileting. Fine motor skills are lost. It is difficult to think clearly.   Incapacitating pain 9 Pain becomes incapacitating. Thought processing is no longer possible. Difficult to remember your own name. Control of movement and coordination are lost.   The worst pain imaginable 10 At this level, most patients pass out from pain. When this level is reached, collapse of the autonomic nervous system occurs, leading to a sudden drop in blood pressure and heart rate. This in turn results in a temporary and dramatic drop in blood flow to the brain, leading to a loss of consciousness. Fainting is one of the body's self defense mechanisms.  Passing out puts the brain in a calmed state and causes it to shut down for a while, in order to begin the healing process.    Summary: 1.  Refer to this scale when providing [place] with your pain level. 2. Be accurate and careful when reporting your pain level. This will help with your care. 3. Over-reporting your pain level will lead to loss of credibility. 4. Even a level of 1/10 means that there is pain and will be treated at our facility. 5. High, inaccurate reporting will be documented as "Symptom Exaggeration", leading to loss of credibility and suspicions of possible secondary gains such as obtaining more narcotics, or wanting to appear disabled, for fraudulent reasons. 6. Only pain levels of 5 or below will be seen at our facility. 7. Pain levels of 6 and above will be sent to the Emergency Department and the appointment cancelled. ____________________________________________________________________________________________

## 2017-04-03 NOTE — Telephone Encounter (Signed)
Kristopher [name]: This is a workers comp case, they are wanting records and a referral, I am not sure how to go about releasing this to them.

## 2017-04-03 NOTE — Telephone Encounter (Signed)
Contacted Kristopher [name] with [employer] and advised that patient must come in office to sign a medical record release form. Once a release is signed, we will discuss and/or release all records that Kristopher [name] approves [place] to release. Lala Lund stated that she would contact the patient to advise.
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
## 2017-04-25 ENCOUNTER — Ambulatory Visit (INDEPENDENT_AMBULATORY_CARE_PROVIDER_SITE_OTHER): Payer: Medicare Other | Admitting: Cardiovascular Disease

## 2017-04-25 ENCOUNTER — Encounter: Payer: Self-pay | Admitting: Cardiovascular Disease

## 2017-04-25 VITALS — BP 140/100 | HR 83 | Ht 70.0 in | Wt 229.0 lb

## 2017-04-25 DIAGNOSIS — M25552 Pain in left hip: Secondary | ICD-10-CM

## 2017-04-25 DIAGNOSIS — R079 Chest pain, unspecified: Secondary | ICD-10-CM | POA: Diagnosis not present

## 2017-04-25 DIAGNOSIS — M7918 Myalgia, other site: Secondary | ICD-10-CM | POA: Diagnosis not present

## 2017-04-25 DIAGNOSIS — G8929 Other chronic pain: Secondary | ICD-10-CM

## 2017-04-25 DIAGNOSIS — I10 Essential (primary) hypertension: Secondary | ICD-10-CM

## 2017-04-25 DIAGNOSIS — Z720 Tobacco use: Secondary | ICD-10-CM

## 2017-04-25 MED ORDER — DOXAZOSIN MESYLATE 4 MG PO TABS: 4.0000 mg | ORAL_TABLET | Freq: Every day | ORAL | 3 refills | Status: DC

## 2017-04-25 NOTE — Patient Instructions (Addendum)
Medication Instructions:   Please start cardura/doxazosin 4 mg in the PM  Move the hydrochlorothiazide (HCTZ)  to the morning Amlodipine /valsartan in thye AM Metoprololol 50 mg in the AM and 100 mg night  Labwork:  No new labs needed  Testing/Procedures:  We will schedule a lexiscan stress test Chest pain, unable to treadmill  [place]  Your caregiver has ordered a Stress Test with nuclear imaging. The purpose of this test is to evaluate the blood supply to your heart muscle. This procedure is referred to as a "Non-Invasive Stress Test." This is because other than having an IV started in your vein, nothing is inserted or "invades" your body. Cardiac stress tests are done to find areas of poor blood flow to the heart by determining the extent of coronary artery disease (CAD). Some patients exercise on a treadmill, which naturally increases the blood flow to your heart, while others who are  unable to walk on a treadmill due to physical limitations have a pharmacologic/chemical stress agent called Lexiscan . This medicine will mimic walking on a treadmill by temporarily increasing your coronary blood flow.   Please note: these test may take anywhere between 2-4 hours to complete  PLEASE REPORT TO [facility] AT THE FIRST DESK WILL DIRECT YOU WHERE TO GO  Date of Procedure:_____________________________________  Arrival Time for Procedure:______________________________  Instructions regarding medication:   __X__:  Hold METOPROLOL the night before and morning of procedure  __X__:  Hold AMLODIPINE & LOSARTAN   How to prepare for your Myoview test:  1. Do not eat or drink after midnight 2. No caffeine for 24 hours prior to test 3. No smoking 24 hours prior to test. 4. Your medication may be taken with water.  If your doctor stopped a medication because of this test, do not take that medication. 5. Please wear a short sleeve shirt. 6. No perfume,  cologne or lotion.  Follow-Up: It was a pleasure seeing you in the office today. Please call us if you have new issues that need to be addressed before your next appt.  [phone]  Your physician wants you to follow-up in:  As needed   If you need a refill on your cardiac medications before your next appointment, please call your pharmacy.  For educational health videos Log in to : [email or web address] Or : [email or web address], password : [email or web address] Cardiac Nuclear Scan A cardiac nuclear scan is a test that measures blood flow to the heart when a person is resting and when he or she is exercising. The test looks for problems such as:  Not enough blood reaching a portion of the heart.  The heart muscle not working normally.  You may need this test if:  You have heart disease.  You have had abnormal lab results.  You have had heart surgery or angioplasty.  You have chest pain.  You have shortness of breath.  In this test, a radioactive dye (tracer) is injected into your bloodstream. After the tracer has traveled to your heart, an imaging device is used to measure how much of the tracer is absorbed by or distributed to various areas of your heart. This procedure is usually done at a hospital and takes 2-4 hours. Tell a health care provider about:  Any allergies you have.  All medicines you are taking, including vitamins, herbs, eye drops, creams, and over-the-counter medicines.  Any problems you or family members have had with the use of anesthetic medicines.  Any blood disorders you have.  Any surgeries you have had.  Any medical conditions you have.  Whether you are pregnant or may be pregnant. What are the risks? Generally, this is a safe procedure. However, problems may occur, including:  Serious chest pain and heart attack. This is only a risk if the stress portion of the test is done.  Rapid heartbeat.  Sensation of warmth in your chest. This usually passes  quickly.  What happens before the procedure?  Ask your health care provider about changing or stopping your regular medicines. This is especially important if you are taking diabetes medicines or blood thinners.  Remove your jewelry on the day of the procedure. What happens during the procedure?  An IV tube will be inserted into one of your veins.  Your health care provider will inject a small amount of radioactive tracer through the tube.  You will wait for 20-40 minutes while the tracer travels through your bloodstream.  Your heart activity will be monitored with an electrocardiogram (ECG).  You will lie down on an exam table.  Images of your heart will be taken for about 15-20 minutes.  You may be asked to exercise on a treadmill or stationary bike. While you exercise, your heart's activity will be monitored with an ECG, and your blood pressure will be checked. If you are unable to exercise, you may be given a medicine to increase blood flow to parts of your heart.  When blood flow to your heart has peaked, a tracer will again be injected through the IV tube.  After 20-40 minutes, you will get back on the exam table and have more images taken of your heart.  When the procedure is over, your IV tube will be removed. The procedure may vary among health care providers and hospitals. Depending on the type of tracer used, scans may need to be repeated 3-4 hours later. What happens after the procedure?  Unless your health care provider tells you otherwise, you may return to your normal schedule, including diet, activities, and medicines.  Unless your health care provider tells you otherwise, you may increase your fluid intake. This will help flush the contrast dye from your body. Drink enough fluid to keep your urine clear or pale yellow.  It is up to you to get your test results. Ask your health care provider, or the department that is doing the test, when your results will be  ready. Summary  A cardiac nuclear scan measures the blood flow to the heart when a person is resting and when he or she is exercising.  You may need this test if you are at risk for heart disease.  Tell your health care provider if you are pregnant.  Unless your health care provider tells you otherwise, increase your fluid intake. This will help flush the contrast dye from your body. Drink enough fluid to keep your urine clear or pale yellow. This information is not intended to replace advice given to you by your health care provider. Make sure you discuss any questions you have with your health care provider. Document Released: 03/15/2004 Document Revised: 02/21/2016 Document Reviewed: 01/27/2013 Elsevier Interactive Patient Education  2017 [employer].

## 2017-05-07 ENCOUNTER — Encounter
Admission: RE | Admit: 2017-05-07 | Discharge: 2017-05-07 | Disposition: A | Discharge status: Home / Self Care | Payer: Worker's Compensation | Source: Ambulatory Visit | Attending: Cardiovascular Disease | Admitting: Cardiovascular Disease

## 2017-05-07 DIAGNOSIS — R079 Chest pain, unspecified: Secondary | ICD-10-CM | POA: Insufficient documentation

## 2017-05-08 ENCOUNTER — Ambulatory Visit
Admission: RE | Admit: 2017-05-08 | Discharge: 2017-05-08 | Disposition: A | Discharge status: Home / Self Care | Payer: Medicare Other | Source: Ambulatory Visit | Attending: Cardiovascular Disease | Admitting: Cardiovascular Disease

## 2017-05-08 DIAGNOSIS — R079 Chest pain, unspecified: Secondary | ICD-10-CM | POA: Diagnosis present

## 2017-05-08 LAB — NM MYOCAR MULTI W/SPECT W/WALL MOTION / EF
LV dias vol: 108 mL (ref 62–150)
LV sys vol: 58 mL
Peak HR: 96 {beats}/min
Percent HR: 58 %
Rest HR: 73 {beats}/min
SDS: 2
SRS: 8
SSS: 7
TID: 1.07

## 2017-05-08 MED ORDER — TECHNETIUM TC 99M TETROFOSMIN IV KIT
31.9540 | PACK | Freq: Once | INTRAVENOUS | Status: AC | PRN
  Administered 2017-05-08: 31.954 via INTRAVENOUS

## 2017-05-08 MED ORDER — TECHNETIUM TC 99M TETROFOSMIN IV KIT
13.0000 | PACK | Freq: Once | INTRAVENOUS | Status: AC | PRN
  Administered 2017-05-08: 13.27 via INTRAVENOUS

## 2017-05-08 MED ORDER — REGADENOSON 0.4 MG/5ML IV SOLN
0.4000 mg | Freq: Once | INTRAVENOUS | Status: AC
  Administered 2017-05-08: 0.4 mg via INTRAVENOUS

## 2017-05-12 ENCOUNTER — Other Ambulatory Visit: Payer: Self-pay | Admitting: *Deleted

## 2017-06-03 ENCOUNTER — Ambulatory Visit
Admission: RE | Admit: 2017-06-03 | Discharge: 2017-06-03 | Disposition: A | Discharge status: Home / Self Care | Payer: Medicare Other | Source: Ambulatory Visit | Attending: Family Medicine | Admitting: Family Medicine

## 2017-06-03 ENCOUNTER — Ambulatory Visit (INDEPENDENT_AMBULATORY_CARE_PROVIDER_SITE_OTHER): Payer: Medicare Other | Admitting: Family Medicine

## 2017-06-03 ENCOUNTER — Encounter: Payer: Self-pay | Admitting: Family Medicine

## 2017-06-03 VITALS — BP 158/94 | HR 90 | Temp 98.7°F | Wt 234.2 lb

## 2017-06-03 DIAGNOSIS — M25562 Pain in left knee: Secondary | ICD-10-CM | POA: Insufficient documentation

## 2017-06-03 DIAGNOSIS — M545 Low back pain: Secondary | ICD-10-CM | POA: Diagnosis not present

## 2017-06-03 DIAGNOSIS — Z23 Encounter for immunization: Secondary | ICD-10-CM | POA: Diagnosis not present

## 2017-06-03 DIAGNOSIS — M5441 Lumbago with sciatica, right side: Secondary | ICD-10-CM | POA: Diagnosis not present

## 2017-06-03 DIAGNOSIS — M25552 Pain in left hip: Secondary | ICD-10-CM | POA: Diagnosis not present

## 2017-06-03 DIAGNOSIS — M47816 Spondylosis without myelopathy or radiculopathy, lumbar region: Secondary | ICD-10-CM | POA: Insufficient documentation

## 2017-06-03 DIAGNOSIS — Z981 Arthrodesis status: Secondary | ICD-10-CM | POA: Insufficient documentation

## 2017-06-03 DIAGNOSIS — M5442 Lumbago with sciatica, left side: Principal | ICD-10-CM

## 2017-06-03 DIAGNOSIS — M25561 Pain in right knee: Secondary | ICD-10-CM | POA: Diagnosis not present

## 2017-06-03 DIAGNOSIS — T148XXA Other injury of unspecified body region, initial encounter: Secondary | ICD-10-CM | POA: Diagnosis not present

## 2017-06-03 MED ORDER — KETOROLAC TROMETHAMINE 60 MG/2ML IM SOLN
60.0000 mg | Freq: Once | INTRAMUSCULAR | Status: AC
Start: 2017-06-03 — End: 2017-06-03
  Administered 2017-06-03: 60 mg via INTRAMUSCULAR

## 2017-06-03 MED ORDER — MELOXICAM 15 MG PO TABS: 15.0000 mg | ORAL_TABLET | Freq: Every day | ORAL | 3 refills | Status: DC

## 2017-06-03 MED ORDER — BACLOFEN 20 MG PO TABS: 20.0000 mg | ORAL_TABLET | Freq: Every evening | ORAL | 3 refills | Status: DC | PRN

## 2017-06-03 NOTE — Patient Instructions (Signed)
Tdap Vaccine (Tetanus, Diphtheria and Pertussis): What You Need to Know 1. Why get vaccinated? Tetanus, diphtheria and pertussis are very serious diseases. Tdap vaccine can protect us from these diseases. And, Tdap vaccine given to pregnant women can protect newborn babies against pertussis. TETANUS (Lockjaw) is rare in the [place] today. It causes painful muscle tightening and stiffness, usually all over the body.  It can lead to tightening of muscles in the head and neck so you can't open your mouth, swallow, or sometimes even breathe. Tetanus kills about 1 out of 10 people who are infected even after receiving the best medical care.  DIPHTHERIA is also rare in the [place] today. It can cause a thick coating to form in the back of the throat.  It can lead to breathing problems, heart failure, paralysis, and death.  PERTUSSIS (Whooping Cough) causes severe coughing spells, which can cause difficulty breathing, vomiting and disturbed sleep.  It can also lead to weight loss, incontinence, and rib fractures. Up to 2 in 100 adolescents and 5 in 100 adults with pertussis are hospitalized or have complications, which could include pneumonia or death.  These diseases are caused by bacteria. Diphtheria and pertussis are spread from person to person through secretions from coughing or sneezing. Tetanus enters the body through cuts, scratches, or wounds. Before vaccines, as many as 200,000 cases of diphtheria, 200,000 cases of pertussis, and hundreds of cases of tetanus, were reported in the [place] each year. Since vaccination began, reports of cases for tetanus and diphtheria have dropped by about 99% and for pertussis by about 80%. 2. Tdap vaccine Tdap vaccine can protect adolescents and adults from tetanus, diphtheria, and pertussis. One dose of Tdap is routinely given at age 11 or 12. People who did not get Tdap at that age should get it as soon as possible. Tdap is especially  important for healthcare professionals and anyone having close contact with a baby younger than 12 months. Pregnant women should get a dose of Tdap during every pregnancy, to protect the newborn from pertussis. Infants are most at risk for severe, life-threatening complications from pertussis. Another vaccine, called Td, protects against tetanus and diphtheria, but not pertussis. A Td booster should be given every 10 years. Tdap may be given as one of these boosters if you have never gotten Tdap before. Tdap may also be given after a severe cut or burn to prevent tetanus infection. Your doctor or the person giving you the vaccine can give you more information. Tdap may safely be given at the same time as other vaccines. 3. Some people should not get this vaccine  A person who has ever had a life-threatening allergic reaction after a previous dose of any diphtheria, tetanus or pertussis containing vaccine, OR has a severe allergy to any part of this vaccine, should not get Tdap vaccine. Tell the person giving the vaccine about any severe allergies.  Anyone who had coma or long repeated seizures within 7 days after a childhood dose of DTP or DTaP, or a previous dose of Tdap, should not get Tdap, unless a cause other than the vaccine was found. They can still get Td.  Talk to your doctor if you: ? have seizures or another nervous system problem, ? had severe pain or swelling after any vaccine containing diphtheria, tetanus or pertussis, ? ever had a condition called Guillain-Barr Syndrome (GBS), ? aren't feeling well on the day the shot is scheduled. 4. Risks With any medicine, including   vaccines, there is a chance of side effects. These are usually mild and go away on their own. Serious reactions are also possible but are rare. Most people who get Tdap vaccine do not have any problems with it. Mild problems following Tdap: (Did not interfere with activities)  Pain where the shot was given (about  3 in 4 adolescents or 2 in 3 adults)  Redness or swelling where the shot was given (about 1 person in 5)  Mild fever of at least 100.4F (up to about 1 in 25 adolescents or 1 in 100 adults)  Headache (about 3 or 4 people in 10)  Tiredness (about 1 person in 3 or 4)  Nausea, vomiting, diarrhea, stomach ache (up to 1 in 4 adolescents or 1 in 10 adults)  Chills, sore joints (about 1 person in 10)  Body aches (about 1 person in 3 or 4)  Rash, swollen glands (uncommon)  Moderate problems following Tdap: (Interfered with activities, but did not require medical attention)  Pain where the shot was given (up to 1 in 5 or 6)  Redness or swelling where the shot was given (up to about 1 in 16 adolescents or 1 in 12 adults)  Fever over 102F (about 1 in 100 adolescents or 1 in 250 adults)  Headache (about 1 in 7 adolescents or 1 in 10 adults)  Nausea, vomiting, diarrhea, stomach ache (up to 1 or 3 people in 100)  Swelling of the entire arm where the shot was given (up to about 1 in 500).  Severe problems following Tdap: (Unable to perform usual activities; required medical attention)  Swelling, severe pain, bleeding and redness in the arm where the shot was given (rare).  Problems that could happen after any vaccine:  People sometimes faint after a medical procedure, including vaccination. Sitting or lying down for about 15 minutes can help prevent fainting, and injuries caused by a fall. Tell your doctor if you feel dizzy, or have vision changes or ringing in the ears.  Some people get severe pain in the shoulder and have difficulty moving the arm where a shot was given. This happens very rarely.  Any medication can cause a severe allergic reaction. Such reactions from a vaccine are very rare, estimated at fewer than 1 in a million doses, and would happen within a few minutes to a few hours after the vaccination. As with any medicine, there is a very remote chance of a vaccine  causing a serious injury or death. The safety of vaccines is always being monitored. For more information, visit: www.cdc.gov/vaccinesafety/ 5. What if there is a serious problem? What should I look for? Look for anything that concerns you, such as signs of a severe allergic reaction, very high fever, or unusual behavior. Signs of a severe allergic reaction can include hives, swelling of the face and throat, difficulty breathing, a fast heartbeat, dizziness, and weakness. These would usually start a few minutes to a few hours after the vaccination. What should I do?  If you think it is a severe allergic reaction or other emergency that can't wait, call 9-1-1 or get the person to the nearest hospital. Otherwise, call your doctor.  Afterward, the reaction should be reported to the Vaccine Adverse Event Reporting System (VAERS). Your doctor might file this report, or you can do it yourself through the VAERS web site at www.vaers.hhs.gov, or by calling 1-800-822-7967. ? VAERS does not give medical advice. 6. The National Vaccine Injury Compensation Program The National   Vaccine Injury Compensation Program (VICP) is a federal program that was created to compensate people who may have been injured by certain vaccines. Persons who believe they may have been injured by a vaccine can learn about the program and about filing a claim by calling 743-192-6684 or visiting the [employer] website at GoldCloset.com.ee. There is a time limit to file a claim for compensation. 7. How can I learn more?  Ask your doctor. He or she can give you the vaccine package insert or suggest other sources of information.  Call your local or state health department.  Contact the Centers for Disease Control and Prevention (CDC): ? Call 585-497-2243 (1-800-CDC-INFO) or ? Visit CDC's website at http://hunter.com/ CDC Tdap Vaccine VIS (04/27/13) This information is not intended to replace advice given to you by your  health care provider. Make sure you discuss any questions you have with your health care provider. Document Released: 08/20/2011 Document Revised: 11/09/2015 Document Reviewed: 11/09/2015 Elsevier Interactive Patient Education  2017 [employer] Ask your health care provider which exercises are safe for you. Do exercises exactly as told by your health care provider and adjust them as directed. It is normal to feel mild stretching, pulling, tightness, or discomfort as you do these exercises, but you should stop right away if you feel sudden pain or your pain gets worse. Do not begin these exercises until told by your health care provider. Stretching and range of motion exercises These exercises warm up your muscles and joints and improve the movement and flexibility of your back. These exercises also help to relieve pain, numbness, and tingling. Exercise A: Lumbar rotation  1. Lie on your back on a firm surface and bend your knees. 2. Straighten your arms out to your sides so each arm forms an "L" shape with a side of your body (a 90 degree angle). 3. Slowly move both of your knees to one side of your body until you feel a stretch in your lower back. Try not to let your shoulders move off of the floor. 4. Hold for __________ seconds. 5. Tense your abdominal muscles and slowly move your knees back to the starting position. 6. Repeat this exercise on the other side of your body. Repeat __________ times. Complete this exercise __________ times a day. Exercise B: Prone extension on elbows  1. Lie on your abdomen on a firm surface. 2. Prop yourself up on your elbows. 3. Use your arms to help lift your chest up until you feel a gentle stretch in your abdomen and your lower back. ? This will place some of your body weight on your elbows. If this is uncomfortable, try stacking pillows under your chest. ? Your hips should stay down, against the surface that you are lying on.  Keep your hip and back muscles relaxed. 4. Hold for __________ seconds. 5. Slowly relax your upper body and return to the starting position. Repeat __________ times. Complete this exercise __________ times a day. Strengthening exercises These exercises build strength and endurance in your back. Endurance is the ability to use your muscles for a long time, even after they get tired. Exercise C: Pelvic tilt 1. Lie on your back on a firm surface. Bend your knees and keep your feet flat. 2. Tense your abdominal muscles. Tip your pelvis up toward the ceiling and flatten your lower back into the floor. ? To help with this exercise, you may place a small towel under your lower back and  try to push your back into the towel. 3. Hold for __________ seconds. 4. Let your muscles relax completely before you repeat this exercise. Repeat __________ times. Complete this exercise __________ times a day. Exercise D: Alternating arm and leg raises  1. Get on your hands and knees on a firm surface. If you are on a hard floor, you may want to use padding to cushion your knees, such as an exercise mat. 2. Line up your arms and legs. Your hands should be below your shoulders, and your knees should be below your hips. 3. Lift your left leg behind you. At the same time, raise your right arm and straighten it in front of you. ? Do not lift your leg higher than your hip. ? Do not lift your arm higher than your shoulder. ? Keep your abdominal and back muscles tight. ? Keep your hips facing the ground. ? Do not arch your back. ? Keep your balance carefully, and do not hold your breath. 4. Hold for __________ seconds. 5. Slowly return to the starting position and repeat with your right leg and your left arm. Repeat __________ times. Complete this exercise __________ times a day. Exercise E: Abdominal set with straight leg raise  1. Lie on your back on a firm surface. 2. Bend one of your knees and keep your other leg  straight. 3. Tense your abdominal muscles and lift your straight leg up, 4-6 inches (10-15 cm) off the ground. 4. Keep your abdominal muscles tight and hold for __________ seconds. ? Do not hold your breath. ? Do not arch your back. Keep it flat against the ground. 5. Keep your abdominal muscles tense as you slowly lower your leg back to the starting position. 6. Repeat with your other leg. Repeat __________ times. Complete this exercise __________ times a day. Posture and body mechanics  Body mechanics refers to the movements and positions of your body while you do your daily activities. Posture is part of body mechanics. Good posture and healthy body mechanics can help to relieve stress in your body's tissues and joints. Good posture means that your spine is in its natural S-curve position (your spine is neutral), your shoulders are pulled back slightly, and your head is not tipped forward. The following are general guidelines for applying improved posture and body mechanics to your everyday activities. Standing   When standing, keep your spine neutral and your feet about hip-width apart. Keep a slight bend in your knees. Your ears, shoulders, and hips should line up.  When you do a task in which you stand in one place for a long time, place one foot up on a stable object that is 2-4 inches (5-10 cm) high, such as a footstool. This helps keep your spine neutral. Sitting   When sitting, keep your spine neutral and keep your feet flat on the floor. Use a footrest, if necessary, and keep your thighs parallel to the floor. Avoid rounding your shoulders, and avoid tilting your head forward.  When working at a desk or a computer, keep your desk at a height where your hands are slightly lower than your elbows. Slide your chair under your desk so you are close enough to maintain good posture.  When working at a computer, place your monitor at a height where you are looking straight ahead and you do  not have to tilt your head forward or downward to look at the screen. Resting   When lying down and resting, avoid  positions that are most painful for you.  If you have pain with activities such as sitting, bending, stooping, or squatting (flexion-based activities), lie in a position in which your body does not bend very much. For example, avoid curling up on your side with your arms and knees near your chest (fetal position).  If you have pain with activities such as standing for a long time or reaching with your arms (extension-based activities), lie with your spine in a neutral position and bend your knees slightly. Try the following positions:  Lying on your side with a pillow between your knees.  Lying on your back with a pillow under your knees. Lifting   When lifting objects, keep your feet at least shoulder-width apart and tighten your abdominal muscles.  Bend your knees and hips and keep your spine neutral. It is important to lift using the strength of your legs, not your back. Do not lock your knees straight out.  Always ask for help to lift heavy or awkward objects. This information is not intended to replace advice given to you by your health care provider. Make sure you discuss any questions you have with your health care provider. Document Released: 02/18/2005 Document Revised: 10/26/2015 Document Reviewed: 11/30/2014 Elsevier Interactive Patient Education  [employer].

## 2017-06-03 NOTE — Progress Notes (Signed)
BP (!) 158/94 (BP Location: Left Arm, Cuff Size: Normal)   Pulse 90   Temp 98.7 F (37.1 C) (Oral)   Wt 234 lb 4 oz (106.3 kg)   SpO2 98%   BMI 33.61 kg/m    Subjective:    Patient ID: Kristopher [name], male    DOB: 1960/03/13, 57 y.o.   MRN: [id number]  HPI: Kristopher [name] is a 57 y.o. male  Chief Complaint  Patient presents with  . Leg Pain    right leg pain, numbness and swelling.  . Hip Pain    pain, tingling and numbness in left hip.   [place] about 2.5 weeks ago. His L leg gave out on him and he fell on his R knee. He had a nail go into his R knee and then hit his head. Did not lose consciousness. Had a headache for about a day and a half, then went away. This pain is very different from his regular chronic pain. He has been having some pain and numbness in his L hip and left leg. Has been having some tingling in his R leg. When he fell, he's not sure if he hurt his back. Continues with pain and numbness and tingling. Really not feeling well. Feels like the L leg gives out on him. Follows with pain management. Currently not on NSAID as naproxen gave him GERD. No other concerns or complaints at this time.   Relevant past medical, surgical, family and social history reviewed and updated as indicated. Interim medical history since our last visit reviewed. Allergies and medications reviewed and updated.  Review of Systems  Constitutional: Negative.   Respiratory: Negative.   Cardiovascular: Negative.   Musculoskeletal: Positive for arthralgias, back pain, gait problem and myalgias. Negative for joint swelling, neck pain and neck stiffness.  Neurological: Positive for weakness and numbness. Negative for dizziness, tremors, seizures, syncope, facial asymmetry, speech difficulty, light-headedness and headaches.  Hematological: Negative.     Per HPI unless specifically indicated above     Objective:    BP (!) 158/94 (BP Location: Left Arm, Cuff Size: Normal)   Pulse 90   Temp  98.7 F (37.1 C) (Oral)   Wt 234 lb 4 oz (106.3 kg)   SpO2 98%   BMI 33.61 kg/m   Wt Readings from Last 3 Encounters:  06/03/17 234 lb 4 oz (106.3 kg)  04/25/17 229 lb (103.9 kg)  04/03/17 223 lb (101.2 kg)    Physical Exam  Constitutional: He is oriented to person, place, and time. He appears well-developed and well-nourished. No distress.  HENT:  Head: Normocephalic and atraumatic.  Right Ear: Hearing normal.  Left Ear: Hearing normal.  Nose: Nose normal.  Eyes: Conjunctivae and lids are normal. Right eye exhibits no discharge. Left eye exhibits no discharge. No scleral icterus.  Cardiovascular: Normal rate, regular rhythm, normal heart sounds and intact distal pulses. Exam reveals no gallop and no friction rub.  No murmur heard. Pulmonary/Chest: Effort normal and breath sounds normal. No respiratory distress. He has no wheezes. He has no rales. He exhibits no tenderness.  Musculoskeletal: He exhibits tenderness.  No knee effusions, small well healing wound R medial knee, tenderness along joint line bilaterally. Paraspinal muscle spasms in the lumbar bilaterally L>R, + straight leg raise, normal neurologic exam  Neurological: He is alert and oriented to person, place, and time.  Skin: Skin is warm, dry and intact. No rash noted. He is not diaphoretic. No erythema. No pallor.  Psychiatric: He  has a normal mood and affect. His speech is normal and behavior is normal. Judgment and thought content normal. Cognition and memory are normal.    Results for orders placed or performed during the hospital encounter of 05/07/17  NM Myocar Multi W/Spect W/Wall Motion / EF  Result Value Ref Range   Rest HR 73 bpm   Rest BP 139/98 mmHg   Percent HR 58 %   Peak HR 96 bpm   Peak BP 134/95 mmHg   SSS 7    SRS 8    SDS 2    TID 1.07    LV sys vol 58 mL   LV dias vol 108 62 - 150 mL      Assessment & Plan:   Problem List Items Addressed This Visit    None    Visit Diagnoses    Left  hip pain    -  Primary   Possibly due to radiculopathy, but he would like to see orthopedics for evaluation. Referral to ortho made today. Call with any concerns.    Relevant Medications   ketorolac (TORADOL) injection 60 mg (Start on 06/03/2017  3:45 PM)   Other Relevant Orders   Ambulatory referral to Orthopedic Surgery   Puncture wound       Will update his tetanus. Given today.   Relevant Orders   Tdap vaccine greater than or equal to 7yo IM   Acute bilateral low back pain with bilateral sciatica       S/P Fall. Will obtain x-rays. Restart baclofen and mobic. Continue to follow with pain management. May need some PT. Await x-ray results. Toradol given today.   Relevant Medications   baclofen (LIORESAL) 20 MG tablet   meloxicam (MOBIC) 15 MG tablet   ketorolac (TORADOL) injection 60 mg (Start on 06/03/2017  3:45 PM)   Other Relevant Orders   DG Lumbar Spine Complete   Acute pain of both knees       Will obtain x-rays and start mobic. Follow up with pain management as needed. Call with any concerns.    Relevant Medications   ketorolac (TORADOL) injection 60 mg (Start on 06/03/2017  3:45 PM)   Other Relevant Orders   DG Knee Complete 4 Views Left   DG Knee Complete 4 Views Right       Follow up plan: Return Pending results. [name]

## 2017-06-06 ENCOUNTER — Telehealth: Payer: Self-pay | Admitting: Family Medicine

## 2017-06-06 NOTE — Telephone Encounter (Signed)
Please let him know that his x-rays came back nice and stable, this may be some muscle issues going on rather than boney issues right now. We're working on getting him into ortho and he should hear soon.

## 2017-06-06 NOTE — Telephone Encounter (Signed)
Called and left a message letting patient know his xray results.

## 2017-06-26 ENCOUNTER — Ambulatory Visit: Payer: Medicare Other

## 2017-06-26 ENCOUNTER — Ambulatory Visit (INDEPENDENT_AMBULATORY_CARE_PROVIDER_SITE_OTHER): Payer: Medicare Other | Admitting: Family Medicine

## 2017-06-26 ENCOUNTER — Encounter: Payer: Self-pay | Admitting: Family Medicine

## 2017-06-26 VITALS — BP 148/100 | HR 74 | Temp 97.8°F | Ht 70.0 in | Wt 229.3 lb

## 2017-06-26 DIAGNOSIS — L918 Other hypertrophic disorders of the skin: Secondary | ICD-10-CM | POA: Diagnosis not present

## 2017-06-26 NOTE — Progress Notes (Signed)
BP (!) 148/100 (BP Location: Left Arm, Patient Position: Sitting, Cuff Size: Large)   Pulse 74   Temp 97.8 F (36.6 C)   Ht 5\' 10"  (1.778 m)   Wt 229 lb 5 oz (104 kg)   SpO2 98%   BMI 32.90 kg/m    Subjective:    Patient ID: Kristopher [name], male    DOB: 1961/02/05, 57 y.o.   MRN: [id number]  HPI: Kristopher [name] is a 57 y.o. male  Chief Complaint  Patient presents with  . skin tags    left armpit   SKIN LESION Duration: months Location: under L armpit Painful: yes Itching: no Onset: gradual Context: bigger Associated signs and symptoms: has been red and irritated- getting caught on his shirt and really irritated History of skin cancer: no History of precancerous skin lesions: no Family history of skin cancer: no   Relevant past medical, surgical, family and social history reviewed and updated as indicated. Interim medical history since our last visit reviewed. Allergies and medications reviewed and updated.  Review of Systems  Constitutional: Negative.   Respiratory: Negative.   Cardiovascular: Negative.   Skin: Negative for color change, pallor, rash and wound.  Psychiatric/Behavioral: Negative.     Per HPI unless specifically indicated above     Objective:    BP (!) 148/100 (BP Location: Left Arm, Patient Position: Sitting, Cuff Size: Large)   Pulse 74   Temp 97.8 F (36.6 C)   Ht 5\' 10"  (1.778 m)   Wt 229 lb 5 oz (104 kg)   SpO2 98%   BMI 32.90 kg/m   Wt Readings from Last 3 Encounters:  06/26/17 229 lb 5 oz (104 kg)  06/03/17 234 lb 4 oz (106.3 kg)  04/25/17 229 lb (103.9 kg)    Physical Exam  Constitutional: He is oriented to person, place, and time. He appears well-developed and well-nourished. No distress.  HENT:  Head: Normocephalic and atraumatic.  Right Ear: Hearing normal.  Left Ear: Hearing normal.  Nose: Nose normal.  Eyes: Conjunctivae and lids are normal. Right eye exhibits no discharge. Left eye exhibits no discharge. No  scleral icterus.  Cardiovascular: Normal rate, regular rhythm, normal heart sounds and intact distal pulses. Exam reveals no gallop and no friction rub.  No murmur heard. Pulmonary/Chest: Effort normal and breath sounds normal. No stridor. No respiratory distress. He has no wheezes. He has no rales. He exhibits no tenderness.  Musculoskeletal: Normal range of motion.  Neurological: He is alert and oriented to person, place, and time.  Skin: Skin is warm, dry and intact. Capillary refill takes less than 2 seconds. No rash noted. He is not diaphoretic. No erythema. No pallor.  2 red, irritated skin tags under L arm  Psychiatric: He has a normal mood and affect. His speech is normal and behavior is normal. Judgment and thought content normal. Cognition and memory are normal.  Nursing note and vitals reviewed.   Results for orders placed or performed during the hospital encounter of 05/07/17  NM Myocar Multi W/Spect W/Wall Motion / EF  Result Value Ref Range   Rest HR 73 bpm   Rest BP 139/98 mmHg   Percent HR 58 %   Peak HR 96 bpm   Peak BP 134/95 mmHg   SSS 7    SRS 8    SDS 2    TID 1.07    LV sys vol 58 mL   LV dias vol 108 62 - 150  mL      Assessment & Plan:   Problem List Items Addressed This Visit    None    Visit Diagnoses    Inflamed skin tag    -  Primary   Removed today as below.       Skin Procedure  Procedure: Skin tag removal  Diagnosis:   ICD-10-CM   1. Inflamed skin tag L91.8    Removed today as below.     Lesion Location/Size: L armpit 1.5cm and 2.5 cm Physician: MJ Consent:  Risks, benefits, and alternative treatments discussed and all questions were answered.  Patient elected to proceed and verbal consent obtained.  Description: Area prepped and draped using semi-sterile technique. Area locally anesthetized using cold spray given his allergy to lidocaine. Skin tags removed completely with electrocautery and bandage applied.  Post Procedure  Instructions: Wound care instructions discussed and patient was instructed to keep area clean and dry.  Signs and symptoms of infection discussed, patient agrees to contact the office ASAP should they occur.  Dressing change recommended daily.    Follow up plan: Return Monday- wound check.

## 2017-06-30 ENCOUNTER — Ambulatory Visit: Payer: Medicare Other | Admitting: Family Medicine

## 2017-07-04 ENCOUNTER — Telehealth: Payer: Self-pay

## 2017-07-04 NOTE — Telephone Encounter (Signed)
Copied from [place] [phone]. Topic: Medicare AWV >> Jul 04, 2017  2:32 PM [place], [place] A, LPN wrote: Reason for CRM: Reason for CRM: called to reschedule missed AWV, please reschedule for anytime with nurse health advisor   Any questions please call Joanette Gula at [phone]

## 2017-07-21 ENCOUNTER — Other Ambulatory Visit: Payer: Self-pay

## 2017-07-21 ENCOUNTER — Ambulatory Visit: Payer: Medicare Other | Attending: Nurse Practitioner | Admitting: Nurse Practitioner

## 2017-07-21 ENCOUNTER — Encounter: Payer: Self-pay | Admitting: Nurse Practitioner

## 2017-07-21 VITALS — BP 138/94 | HR 74 | Temp 97.8°F | Resp 18 | Ht 70.0 in | Wt 215.0 lb

## 2017-07-21 DIAGNOSIS — G894 Chronic pain syndrome: Secondary | ICD-10-CM | POA: Diagnosis not present

## 2017-07-21 DIAGNOSIS — K219 Gastro-esophageal reflux disease without esophagitis: Secondary | ICD-10-CM | POA: Insufficient documentation

## 2017-07-21 DIAGNOSIS — Z79899 Other long term (current) drug therapy: Secondary | ICD-10-CM | POA: Diagnosis not present

## 2017-07-21 DIAGNOSIS — M47816 Spondylosis without myelopathy or radiculopathy, lumbar region: Secondary | ICD-10-CM

## 2017-07-21 DIAGNOSIS — D125 Benign neoplasm of sigmoid colon: Secondary | ICD-10-CM | POA: Insufficient documentation

## 2017-07-21 DIAGNOSIS — M5441 Lumbago with sciatica, right side: Secondary | ICD-10-CM | POA: Insufficient documentation

## 2017-07-21 DIAGNOSIS — M545 Low back pain: Secondary | ICD-10-CM | POA: Diagnosis present

## 2017-07-21 DIAGNOSIS — M47812 Spondylosis without myelopathy or radiculopathy, cervical region: Secondary | ICD-10-CM | POA: Diagnosis not present

## 2017-07-21 DIAGNOSIS — F1190 Opioid use, unspecified, uncomplicated: Secondary | ICD-10-CM | POA: Diagnosis not present

## 2017-07-21 DIAGNOSIS — M4802 Spinal stenosis, cervical region: Secondary | ICD-10-CM | POA: Insufficient documentation

## 2017-07-21 DIAGNOSIS — M25552 Pain in left hip: Secondary | ICD-10-CM | POA: Diagnosis not present

## 2017-07-21 DIAGNOSIS — D122 Benign neoplasm of ascending colon: Secondary | ICD-10-CM | POA: Insufficient documentation

## 2017-07-21 DIAGNOSIS — Z1211 Encounter for screening for malignant neoplasm of colon: Secondary | ICD-10-CM | POA: Diagnosis not present

## 2017-07-21 DIAGNOSIS — G8929 Other chronic pain: Secondary | ICD-10-CM

## 2017-07-21 DIAGNOSIS — M25561 Pain in right knee: Secondary | ICD-10-CM | POA: Diagnosis not present

## 2017-07-21 DIAGNOSIS — M5442 Lumbago with sciatica, left side: Secondary | ICD-10-CM | POA: Insufficient documentation

## 2017-07-21 DIAGNOSIS — M25562 Pain in left knee: Secondary | ICD-10-CM | POA: Diagnosis not present

## 2017-07-21 DIAGNOSIS — M5416 Radiculopathy, lumbar region: Secondary | ICD-10-CM | POA: Diagnosis not present

## 2017-07-21 DIAGNOSIS — K621 Rectal polyp: Secondary | ICD-10-CM | POA: Diagnosis not present

## 2017-07-21 DIAGNOSIS — M7918 Myalgia, other site: Secondary | ICD-10-CM | POA: Diagnosis not present

## 2017-07-21 DIAGNOSIS — F17210 Nicotine dependence, cigarettes, uncomplicated: Secondary | ICD-10-CM | POA: Insufficient documentation

## 2017-07-21 DIAGNOSIS — E876 Hypokalemia: Secondary | ICD-10-CM | POA: Diagnosis not present

## 2017-07-21 DIAGNOSIS — M5481 Occipital neuralgia: Secondary | ICD-10-CM | POA: Insufficient documentation

## 2017-07-21 DIAGNOSIS — M79605 Pain in left leg: Secondary | ICD-10-CM | POA: Diagnosis present

## 2017-07-21 DIAGNOSIS — Z79891 Long term (current) use of opiate analgesic: Secondary | ICD-10-CM

## 2017-07-21 MED ORDER — OXYCODONE HCL 10 MG PO TABS: 10.0000 mg | ORAL_TABLET | Freq: Four times a day (QID) | ORAL | 0 refills | Status: DC | PRN

## 2017-07-21 MED ORDER — NALOXONE HCL 4 MG/0.1ML NA LIQD: 4.0000 mg | Freq: Once | NASAL | 0 refills | Status: AC

## 2017-07-21 NOTE — Progress Notes (Signed)
Nursing Pain Medication Assessment:  Safety precautions to be maintained throughout the outpatient stay will include: orient to surroundings, keep bed in low position, maintain call bell within reach at all times, provide assistance with transfer out of bed and ambulation.  Medication Inspection Compliance: Pill count conducted under aseptic conditions, in front of the patient. Neither the pills nor the bottle was removed from the patient's sight at any time. Once count was completed pills were immediately returned to the patient in their original bottle.  Medication: Oxycodone IR Pill/Patch Count: 37 of 120 pills remain Pill/Patch Appearance: Markings consistent with prescribed medication Bottle Appearance: Standard pharmacy container. Clearly labeled. Filled Date: 05 / 01 / 2019 Last Medication intake:  Today

## 2017-07-21 NOTE — Patient Instructions (Addendum)
____________________________________________________________________________________________  Medication Rules  Applies to: All patients receiving prescriptions (written or electronic).  Pharmacy of record: Pharmacy where electronic prescriptions will be sent. If written prescriptions are taken to a different pharmacy, please inform the nursing staff. The pharmacy listed in the electronic medical record should be the one where you would like electronic prescriptions to be sent.  Prescription refills: Only during scheduled appointments. Applies to both, written and electronic prescriptions.  NOTE: The following applies primarily to controlled substances (Opioid* Pain Medications).   Patient's responsibilities: 1. Pain Pills: Bring all pain pills to every appointment (except for procedure appointments). 2. Pill Bottles: Bring pills in original pharmacy bottle. Always bring newest bottle. Bring bottle, even if empty. 3. Medication refills: You are responsible for knowing and keeping track of what medications you need refilled. The day before your appointment, write a list of all prescriptions that need to be refilled. Bring that list to your appointment and give it to the admitting nurse. Prescriptions will be written only during appointments. If you forget a medication, it will not be "Called in", "Faxed", or "electronically sent". You will need to get another appointment to get these prescribed. 4. Prescription Accuracy: You are responsible for carefully inspecting your prescriptions before leaving our office. Have the discharge nurse carefully go over each prescription with you, before taking them home. Make sure that your name is accurately spelled, that your address is correct. Check the name and dose of your medication to make sure it is accurate. Check the number of pills, and the written instructions to make sure they are clear and accurate. Make sure that you are given enough medication to  last until your next medication refill appointment. 5. Taking Medication: Take medication as prescribed. Never take more pills than instructed. Never take medication more frequently than prescribed. Taking less pills or less frequently is permitted and encouraged, when it comes to controlled substances (written prescriptions).  6. Inform other Doctors: Always inform, all of your healthcare providers, of all the medications you take. 7. Pain Medication from other Providers: You are not allowed to accept any additional pain medication from any other Doctor or Healthcare provider. There are two exceptions to this rule. (see below) In the event that you require additional pain medication, you are responsible for notifying us, as stated below. 8. Medication Agreement: You are responsible for carefully reading and following our Medication Agreement. This must be signed before receiving any prescriptions from our practice. Safely store a copy of your signed Agreement. Violations to the Agreement will result in no further prescriptions. (Additional copies of our Medication Agreement are available upon request.) 9. Laws, Rules, & Regulations: All patients are expected to follow all Federal and State Laws, Statutes, Rules, & Regulations. Ignorance of the Laws does not constitute a valid excuse. The use of any illegal substances is prohibited. 10. Adopted CDC guidelines & recommendations: Target dosing levels will be at or below 60 MME/day. Use of benzodiazepines** is not recommended.  Exceptions: There are only two exceptions to the rule of not receiving pain medications from other Healthcare Providers. 1. Exception #1 (Emergencies): In the event of an emergency (i.e.: accident requiring emergency care), you are allowed to receive additional pain medication. However, you are responsible for: As soon as you are able, call our office [phone], at any time of the day or night, and leave a message stating your  name, the date and nature of the emergency, and the name and dose of the medication   prescribed. In the event that your call is answered by a member of our staff, make sure to document and save the date, time, and the name of the person that took your information.  2. Exception #2 (Planned Surgery): In the event that you are scheduled by another doctor or dentist to have any type of surgery or procedure, you are allowed (for a period no longer than 30 days), to receive additional pain medication, for the acute post-op pain. However, in this case, you are responsible for picking up a copy of our "Post-op Pain Management for Surgeons" handout, and giving it to your surgeon or dentist. This document is available at our office, and does not require an appointment to obtain it. Simply go to our office during business hours (Monday-Thursday from 8:00 AM to 4:00 PM) (Friday 8:00 AM to 12:00 Noon) or if you have a scheduled appointment with [place], prior to your surgery, and ask for it by name. In addition, you will need to provide [place] with your name, name of your surgeon, type of surgery, and date of procedure or surgery.  *Opioid medications include: morphine, codeine, oxycodone, oxymorphone, hydrocodone, hydromorphone, meperidine, tramadol, tapentadol, buprenorphine, fentanyl, methadone. **Benzodiazepine medications include: diazepam (Valium), alprazolam (Xanax), clonazepam (Klonopine), lorazepam (Ativan), clorazepate (Tranxene), chlordiazepoxide (Librium), estazolam (Prosom), oxazepam (Serax), temazepam (Restoril), triazolam (Halcion) (Last updated: 05/01/2017) ____________________________________________________________________________________________    BMI Assessment: Estimated body mass index is 30.85 kg/m as calculated from the following:   Height as of this encounter: 5\' 10"  (1.778 m).   Weight as of this encounter: 215 lb (97.5 kg).  BMI interpretation table: BMI level Category Range association with  higher incidence of chronic pain  <18 kg/m2 Underweight   18.5-24.9 kg/m2 Ideal body weight   25-29.9 kg/m2 Overweight Increased incidence by 20%  30-34.9 kg/m2 Obese (Class I) Increased incidence by 68%  35-39.9 kg/m2 Severe obesity (Class II) Increased incidence by 136%  >40 kg/m2 Extreme obesity (Class III) Increased incidence by 254%   Patient's current BMI Ideal Body weight  Body mass index is 30.85 kg/m. Ideal body weight: 73 kg (160 lb 15 oz) Adjusted ideal body weight: 82.8 kg (182 lb 9 oz)   BMI Readings from Last 4 Encounters:  07/21/17 30.85 kg/m  06/26/17 32.90 kg/m  06/03/17 33.61 kg/m  04/25/17 32.86 kg/m   Wt Readings from Last 4 Encounters:  07/21/17 215 lb (97.5 kg)  06/26/17 229 lb 5 oz (104 kg)  06/03/17 234 lb 4 oz (106.3 kg)  04/25/17 229 lb (103.9 kg)

## 2017-07-21 NOTE — Progress Notes (Signed)
Patient's Name: Kristopher [name]  MRN: [id number]  Referring Provider: [name], DO  DOB: 12-30-1960  PCP: [name], DO  DOS: 07/21/2017  Note by: Vevelyn Francois NP  Service setting: Ambulatory outpatient  Specialty: Interventional Pain Management  Location: [facility] (AMB) Pain Management Facility    Patient type: Established    Primary Reason(s) for Visit: Encounter for prescription drug management. (Level of risk: moderate)  CC: Hip Pain (left); Back Pain (low); and Leg Pain (left)  HPI  Kristopher [name] is a 57 y.o. year old, male patient, who comes today for a medication management evaluation. He has Chronic low back pain (Location of Primary Source of Pain) (Bilateral) (L>R); Lumbar spondylosis; Chronic lumbar radicular pain (Location of Secondary source of pain) (S1 dermatomal) (Left); 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); Chronic neck pain (posterior midline) (Bilateral) (L>R); Cervical spondylosis; Chronic cervical radicular pain (Location of Tertiary source of pain) (Bilateral) (C5/C6 dermatome) (L>R); Long term current use of opiate analgesic; Long term prescription opiate use; Opiate use (60 MME/Day); Opioid dependence ([place]); Encounter for therapeutic drug level monitoring; Cervical spinal stenosis (C4-5); Cervical foraminal stenosis (Bilateral C5-6); Hypokalemia; Retrolisthesis of L5-S1; Cervical disc herniation (C4-5 and C5-6); Lumbar disc herniation (L5-S1); Chronic sacroiliac joint pain (Bilateral) (L>R); Allergy history, anesthetic (Unconfirmed allergy to Lidocaine); Chronic hip pain (Left); Lumbar facet syndrome (Location of Primary Source of Pain) (Bilateral) (L>R); Chronic lower extremity pain (Location of Secondary source of pain) (Left); Chronic upper extremity pain (Location of Tertiary source of pain) (Bilateral) (L>R); Greater occipital neuralgia (Right); Chronic pain syndrome; Benign hypertensive renal disease; Migraine; Special screening for  malignant neoplasms, colon; Benign neoplasm of ascending colon; Polyp of sigmoid colon; Rectal polyp; Tobacco abuse; GERD (gastroesophageal reflux disease); and Musculoskeletal pain, chronic on their problem list. His primarily concern today is the Hip Pain (left); Back Pain (low); and Leg Pain (left)  Pain Assessment: Location: Left Hip Radiating: left leg, low back Onset: More than a month ago Duration: Chronic pain Quality: Constant, Sharp, Stabbing, Shooting Severity: 7 /10 (subjective, self-reported pain score)  Note: Reported level is compatible with observation. Clinically the patient looks like a 2/10 A 2/10 is viewed as "Mild to Moderate" and described as noticeable and distracting. Impossible to hide from other people. More frequent flare-ups. Still possible to adapt and function close to normal. It can be very annoying and may have occasional stronger flare-ups. With discipline, patients may get used to it and adapt. Information on the proper use of the pain scale provided to the patient today. When using our objective Pain Scale, levels between 6 and 10/10 are said to belong in an emergency room, as it progressively worsens from a 6/10, described as severely limiting, requiring emergency care not usually available at an outpatient pain management facility. At a 6/10 level, communication becomes difficult and requires great effort. Assistance to reach the emergency department may be required. Facial flushing and profuse sweating along with potentially dangerous increases in heart rate and blood pressure will be evident. Timing: Constant Modifying factors: medications, topical cream, heat BP: (!) 138/94  HR: 74  Mr. Baldinger was last scheduled for an appointment on 04/03/2017 for medication management. During today's appointment we reviewed Mr. Junkins chronic pain status, as well as his outpatient medication regimen. He states that his pain is worse secondary to being on a boat this weekend,  He states that the pain is going down his left leg. He admits that it comes and goes. He  has numbness and tingling in all toes. He admits that he has weakness in his left leg at times. He admits that he has had xrays of his left hip in April at his PCP. He was trying to refer him for further evaluation secondary to this being WC. He has to wait on referral and approval.   The patient  reports that he does not use drugs. His body mass index is 30.85 kg/m.  Further details on both, my assessment(s), as well as the proposed treatment plan, please see below.  Controlled Substance Pharmacotherapy Assessment REMS (Risk Evaluation and Mitigation Strategy)  Analgesic:Oxycodone IR 10 mg every 6 hours (40 mg/day of oxycodone) MME/day:60 mg/day    Hart Rochester, RN  07/21/2017  8:44 AM  Sign at close encounter Nursing Pain Medication Assessment:  Safety precautions to be maintained throughout the outpatient stay will include: orient to surroundings, keep bed in low position, maintain call bell within reach at all times, provide assistance with transfer out of bed and ambulation.  Medication Inspection Compliance: Pill count conducted under aseptic conditions, in front of the patient. Neither the pills nor the bottle was removed from the patient's sight at any time. Once count was completed pills were immediately returned to the patient in their original bottle.  Medication: Oxycodone IR Pill/Patch Count: 37 of 120 pills remain Pill/Patch Appearance: Markings consistent with prescribed medication Bottle Appearance: Standard pharmacy container. Clearly labeled. Filled Date: 05 / 01 / 2019 Last Medication intake:  Today   Pharmacokinetics: Liberation and absorption (onset of action): WNL Distribution (time to peak effect): WNL Metabolism and excretion (duration of action): WNL         Pharmacodynamics: Desired effects: Analgesia: Kristopher [name] reports >50% benefit. Functional ability: Patient  reports that medication allows him to accomplish basic ADLs Clinically meaningful improvement in function (CMIF): Sustained CMIF goals met Perceived effectiveness: Described as relatively effective, allowing for increase in activities of daily living (ADL) Undesirable effects: Side-effects or Adverse reactions: None reported Monitoring: [place] PMP: Online review of the past 29-monthperiod conducted. Compliant with practice rules and regulations Last UDS on record: Summary  Date Value Ref Range Status  01/08/2017 FINAL  Final    Comment:    ==================================================================== TOXASSURE SELECT 13 (MW) ==================================================================== Test                             Result       Flag       Units Drug Present and Declared for Prescription Verification   Oxycodone                      492          EXPECTED   ng/mg creat   Oxymorphone                    636          EXPECTED   ng/mg creat   Noroxycodone                   765          EXPECTED   ng/mg creat   Noroxymorphone                 157          EXPECTED   ng/mg creat    Sources of oxycodone are scheduled prescription medications.  Oxymorphone, noroxycodone, and noroxymorphone are expected    metabolites of oxycodone. Oxymorphone is also available as a    scheduled prescription medication. ==================================================================== Test                      Result    Flag   Units      Ref Range   Creatinine              239              mg/dL      >=20 ==================================================================== Declared Medications:  The flagging and interpretation on this report are based on the  following declared medications.  Unexpected results may arise from  inaccuracies in the declared medications.  **Note: The testing scope of this panel includes these medications:  Oxycodone  **Note: The testing scope of this panel does  not include following  reported medications:  Albuterol  Amlodipine (Exforge)  Doxycycline  Erythromycin  Hydrochlorothiazide  Metoprolol  Naloxone  Nortriptyline  Omeprazole  Potassium (Klor-Con)  Prednisone  Sumatriptan  Valsartan (Exforge) ==================================================================== For clinical consultation, please call [phone]. ====================================================================    UDS interpretation: Compliant          Medication Assessment Form: Reviewed. Patient indicates being compliant with therapy Treatment compliance: Compliant Risk Assessment Profile: Aberrant behavior: See prior evaluations. None observed or detected today Comorbid factors increasing risk of overdose: See prior notes. No additional risks detected today Risk of substance use disorder (SUD): Low  ORT Scoring interpretation table:  Score <3 = Low Risk for SUD  Score between 4-7 = Moderate Risk for SUD  Score >8 = High Risk for Opioid Abuse   Risk Mitigation Strategies:  Patient Counseling: Covered Patient-Prescriber Agreement (PPA): Present and active  Notification to other healthcare providers: Done  Pharmacologic Plan: No change in therapy, at this time.             Laboratory Chemistry  Inflammation Markers (CRP: Acute Phase) (ESR: Chronic Phase) Lab Results  Component Value Date   CRP 0.7 04/03/2015   ESRSEDRATE 11 04/03/2015                         Rheumatology Markers No results found for: Elayne Guerin, [facility]                      Renal Function Markers Lab Results  Component Value Date   BUN 11 03/31/2017   CREATININE 0.89 03/31/2017   BCR 12 03/31/2017   GFRAA 111 03/31/2017   GFRNONAA 96 03/31/2017                              Hepatic Function Markers Lab Results  Component Value Date   AST 39 03/31/2017   ALT 57 (H) 03/31/2017   ALBUMIN 4.7 03/31/2017   ALKPHOS 113 03/31/2017                         Electrolytes Lab Results  Component Value Date   NA 143 03/31/2017   K 4.2 03/31/2017   CL 103 03/31/2017   CALCIUM 9.7 03/31/2017   MG 2.0 04/03/2015                        Neuropathy Markers Lab Results  Component Value Date  HIV Non Reactive 06/24/2016                        Bone Pathology Markers No results found for: [place], [id number], [id number], [id number], 25OHVITD1, 25OHVITD2, 25OHVITD3, TESTOFREE, TESTOSTERONE                       Coagulation Parameters Lab Results  Component Value Date   PLT 194 03/31/2017                        Cardiovascular Markers Lab Results  Component Value Date   HGB 14.9 03/31/2017   HCT 45.1 03/31/2017                         CA Markers No results found for: CEA, CA125, LABCA2                      Note: Lab results reviewed.  Recent Diagnostic Imaging Results  DG Lumbar Spine Complete CLINICAL DATA:  Acute bilateral low back pain with bilateral sciatica  EXAM: LUMBAR SPINE - COMPLETE 4+ VIEW  COMPARISON:  06/06/2015  FINDINGS: Normal alignment. Negative for fracture. Mild disc degeneration L3-4 L4-5 with slight disc space narrowing.  Anterior discectomy interbody fusion L5-S1. Hardware in satisfactory position and unchanged from the prior study.  IMPRESSION: Anterior fusion L5-S1 unchanged. Mild lumbar degenerative changes. No acute abnormality.  Electronically Signed   By: Franchot Gallo M.D.   On: 06/04/2017 09:22 DG Knee Complete 4 Views Right CLINICAL DATA:  Worsening chronic bilateral knee pain. No known injury.  EXAM: LEFT KNEE - COMPLETE 4+ VIEW; RIGHT KNEE - COMPLETE 4+ VIEW  COMPARISON:  None.  FINDINGS: No evidence of fracture, dislocation, or joint effusion. No evidence of arthropathy or other focal bone abnormality. Soft tissues are unremarkable.  IMPRESSION: Negative exam.  Electronically Signed   By: Inge Rise M.D.   On: 06/04/2017 09:22 DG Knee Complete 4  Views Left CLINICAL DATA:  Worsening chronic bilateral knee pain. No known injury.  EXAM: LEFT KNEE - COMPLETE 4+ VIEW; RIGHT KNEE - COMPLETE 4+ VIEW  COMPARISON:  None.  FINDINGS: No evidence of fracture, dislocation, or joint effusion. No evidence of arthropathy or other focal bone abnormality. Soft tissues are unremarkable.  IMPRESSION: Negative exam.  Electronically Signed   By: Inge Rise M.D.   On: 06/04/2017 09:22  Complexity Note: Imaging results reviewed. Results shared with Mr. Eckert, using Layman's terms.                         Meds   Current Outpatient Medications:  .  albuterol (PROVENTIL HFA;VENTOLIN HFA) 108 (90 Base) MCG/ACT inhaler, Inhale 2 puffs into the lungs every 6 (six) hours as needed for wheezing or shortness of breath., Disp: 1 Inhaler, Rfl: 2 .  amLODipine-valsartan (EXFORGE) 10-320 MG tablet, Take 1 tablet by mouth daily., Disp: 90 tablet, Rfl: 1 .  baclofen (LIORESAL) 20 MG tablet, Take 1 tablet (20 mg total) by mouth at bedtime as needed for muscle spasms., Disp: 30 each, Rfl: 3 .  doxazosin (CARDURA) 4 MG tablet, Take 1 tablet (4 mg total) by mouth daily., Disp: 90 tablet, Rfl: 3 .  hydrochlorothiazide (HYDRODIURIL) 25 MG tablet, Take 1 tablet (25 mg total) by mouth daily., Disp: 90 tablet, Rfl: 3 .  meloxicam (MOBIC) 15 MG tablet, Take  1 tablet (15 mg total) by mouth daily., Disp: 30 tablet, Rfl: 3 .  metoprolol succinate (TOPROL-XL) 100 MG 24 hr tablet, Take 100 mg by mouth daily. Take with or immediately following a meal., Disp: , Rfl:  .  naloxone (NARCAN) nasal spray 4 mg/0.1 mL, Place 1 spray (4 mg total) into the nose once for 1 dose. , then call 911, repeat if needed in other nostril with new bottle., Disp: 4 mg, Rfl: 0 .  omeprazole (PRILOSEC) 20 MG capsule, Take 1 capsule (20 mg total) by mouth daily., Disp: 30 capsule, Rfl: 3 .  [START ON 08/31/2017] Oxycodone HCl 10 MG TABS, Take 1 tablet (10 mg total) by mouth every 6 (six) hours  as needed., Disp: 120 tablet, Rfl: 0 .  SUMAtriptan (IMITREX) 100 MG tablet, Take 1 tablet earliest onset of headache.  May repeat once after 2 hours if headache persists or recurs.  Do not exceed 2 tablets in 24 hrs, Disp: 10 tablet, Rfl: 12 .  [START ON 09/30/2017] Oxycodone HCl 10 MG TABS, Take 1 tablet (10 mg total) by mouth every 6 (six) hours as needed., Disp: 120 tablet, Rfl: 0 .  [START ON 08/01/2017] Oxycodone HCl 10 MG TABS, Take 1 tablet (10 mg total) by mouth every 6 (six) hours as needed., Disp: 120 tablet, Rfl: 0  ROS  Constitutional: Denies any fever or chills Gastrointestinal: No reported hemesis, hematochezia, vomiting, or acute GI distress Musculoskeletal: Denies any acute onset joint swelling, redness, loss of ROM, or weakness Neurological: No reported episodes of acute onset apraxia, aphasia, dysarthria, agnosia, amnesia, paralysis, loss of coordination, or loss of consciousness  Allergies  Mr. Strausbaugh is allergic to lidocaine; penicillins; strawberry (diagnostic); aspirin; and novocain [procaine].  [place]  Drug: Mr. Fahs  reports that he does not use drugs. Alcohol:  reports that he does not drink alcohol. Tobacco:  reports that he has been smoking cigarettes.  He has a 26.25 pack-year smoking history. He has never used smokeless tobacco. Medical:  has a past medical history of Allergy, Arthritis, Chronic back pain, Hypertension, and Migraines. Surgical: Mr. Epps  has a past surgical history that includes Back surgery; Appendectomy; Leg Surgery; Knee surgery (Left); Foot surgery (Left); Colonoscopy with propofol (N/A, 02/19/2016); and polypectomy (N/A, 02/19/2016). Family: family history includes Cancer in his father; Diabetes in his sister; Thrombosis in his sister.  Constitutional Exam  General appearance: Well nourished, well developed, and well hydrated. In no apparent acute distress Vitals:   07/21/17 0835  BP: (!) 138/94  Pulse: 74  Resp: 18  Temp: 97.8 F (36.6  C)  TempSrc: Oral  SpO2: 98%  Weight: 215 lb (97.5 kg)  Height: _0  (1.778 m)  Psych/Mental status: Alert, oriented x 3 (person, place, & time)       Eyes: PERLA Respiratory: No evidence of acute respiratory distress  Lumbar Spine Area Exam  Skin & Axial Inspection: No masses, redness, or swelling Alignment: Symmetrical Functional ROM: Guarding       Stability: No instability detected Muscle Tone/Strength: Functionally intact. No obvious neuro-muscular anomalies detected. Sensory (Neurological): Unimpaired Palpation: Complains of area being tender to palpation       Provocative Tests: Lumbar Hyperextension and rotation test: Positive on the left for facet joint pain. Lumbar quadrant test (Kemp's test): deferred today       Lumbar Lateral bending test: deferred today       Patrick's Maneuver: Unable to perform  on the left Leg raises + on left < 30  FABER test: deferred today                   Thigh-thrust test: deferred today                   S-I compression test: deferred today                   S-I distraction test: deferred today                    Gait & Posture Assessment  Ambulation: Patient ambulates using a cane Gait: Relatively normal for age and body habitus Posture: Antalgic   Lower Extremity Exam    Side: Right lower extremity  Side: Left lower extremity  Stability: No instability observed          Stability: No instability observed          Skin & Extremity Inspection: Skin color, temperature, and hair growth are WNL. No peripheral edema or cyanosis. No masses, redness, swelling, asymmetry, or associated skin lesions. No contractures.  Skin & Extremity Inspection: Skin color, temperature, and hair growth are WNL. No peripheral edema or cyanosis. No masses, redness, swelling, asymmetry, or associated skin lesions. No contractures.  Functional ROM: Unrestricted ROM                  Functional ROM: Unrestricted ROM                  Muscle  Tone/Strength: Functionally intact. No obvious neuro-muscular anomalies detected.  Muscle Tone/Strength: Functionally intact. No obvious neuro-muscular anomalies detected.  Sensory (Neurological): Unimpaired  Sensory (Neurological): Unimpaired  Palpation: No palpable anomalies  Palpation: No palpable anomalies   Assessment  Primary Diagnosis & Pertinent Problem List: The primary encounter diagnosis was Chronic hip pain (Left). Diagnoses of Lumbar spondylosis, Chronic lumbar radicular pain (Location of Secondary source of pain) (S1 dermatomal) (Left), Chronic pain syndrome, Opiate use (60 MME/Day), and Long term prescription opiate use were also pertinent to this visit.  Status Diagnosis  Having a Flare-up Having a Flare-up Having a Flare-up 1. Chronic hip pain (Left)   2. Lumbar spondylosis   3. Chronic lumbar radicular pain (Location of Secondary source of pain) (S1 dermatomal) (Left)   4. Chronic pain syndrome   5. Opiate use (60 MME/Day)   6. Long term prescription opiate use     Problems updated and reviewed during this visit: No problems updated. Plan of Care  Pharmacotherapy (Medications Ordered): Meds ordered this encounter  Medications  . naloxone (NARCAN) nasal spray 4 mg/0.1 mL    Sig: Place 1 spray (4 mg total) into the nose once for 1 dose. , then call 911, repeat if needed in other nostril with new bottle.    Dispense:  4 mg    Refill:  0    Please provide the patient with clear instructions on the use of this device/medication.    Order Specific Question:   Supervising Provider    Answer:   [name] [phone]  . Oxycodone HCl 10 MG TABS    Sig: Take 1 tablet (10 mg total) by mouth every 6 (six) hours as needed.    Dispense:  120 tablet    Refill:  0    Do not place this medication, or any other prescription from our practice, on "Automatic Refill". Patient may have prescription filled one day early if pharmacy  is closed on scheduled refill date. Do not fill  until:09/30/2017 To last until: 10/30/2017    Order Specific Question:   Supervising Provider    Answer:   [name] [phone]  . Oxycodone HCl 10 MG TABS    Sig: Take 1 tablet (10 mg total) by mouth every 6 (six) hours as needed.    Dispense:  120 tablet    Refill:  0    Do not place this medication, or any other prescription from our practice, on "Automatic Refill". Patient may have prescription filled one day early if pharmacy is closed on scheduled refill date. Do not fill until:08/31/2017 To last until: 09/30/2017    Order Specific Question:   Supervising Provider    Answer:   [name] [phone]  . Oxycodone HCl 10 MG TABS    Sig: Take 1 tablet (10 mg total) by mouth every 6 (six) hours as needed.    Dispense:  120 tablet    Refill:  0    Do not place this medication, or any other prescription from our practice, on "Automatic Refill". Patient may have prescription filled one day early if pharmacy is closed on scheduled refill date. Do not fill until: 08/01/2017 To last until:08/31/2017    Order Specific Question:   Supervising Provider    Answer:   [name] [phone]   New Prescriptions   No medications on file   Medications administered today: Esker A. Cornman had no medications administered during this visit. Lab-work, procedure(s), and/or referral(s): Orders Placed This Encounter  Procedures  . ToxASSURE Select 13 (MW), Urine   Imaging and/or referral(s): None  Interventional therapies: Planned, scheduled, and/or pending: Not at this time.   Considering: None at this time   Palliative PRN treatment(s): None at this time     Provider-requested follow-up: Return in about 3 months (around 10/21/2017) for MedMgmt with Me Donella Stade Edison Pace).  Future Appointments  Date Time Provider [place]  10/21/2017  8:45 AM Vevelyn Francois, NP [facility] None   Primary Care Physician: [name], DO Location: [facility] Note by: Vevelyn Francois NP Date: 07/21/2017; Time: 11:06 AM  Pain Score Disclaimer: We use the NRS-11 scale. This is a self-reported, subjective measurement of pain severity with only modest accuracy. It is used primarily to identify changes within a particular patient. It must be understood that outpatient pain scales are significantly less accurate that those used for research, where they can be applied under ideal controlled circumstances with minimal exposure to variables. In reality, the score is likely to be a combination of pain intensity and pain affect, where pain affect describes the degree of emotional arousal or changes in action readiness caused by the sensory experience of pain. Factors such as social and work situation, setting, emotional state, anxiety levels, expectation, and prior pain experience may influence pain perception and show large inter-individual differences that may also be affected by time variables.  Patient instructions provided during this appointment: Patient Instructions    ____________________________________________________________________________________________  Medication Rules  Applies to: All patients receiving prescriptions (written or electronic).  Pharmacy of record: Pharmacy where electronic prescriptions will be sent. If written prescriptions are taken to a different pharmacy, please inform the nursing staff. The pharmacy listed in the electronic medical record should be the one where you would like electronic prescriptions to be sent.  Prescription refills: Only during scheduled appointments. Applies to both, written and electronic prescriptions.  NOTE: The following applies primarily  to controlled substances (Opioid* Pain Medications).   Patient's responsibilities: 1. Pain Pills: Bring all pain pills to every appointment (except for procedure appointments). 2. Pill Bottles: Bring pills in original pharmacy bottle. Always  bring newest bottle. Bring bottle, even if empty. 3. Medication refills: You are responsible for knowing and keeping track of what medications you need refilled. The day before your appointment, write a list of all prescriptions that need to be refilled. Bring that list to your appointment and give it to the admitting nurse. Prescriptions will be written only during appointments. If you forget a medication, it will not be "Called in", "Faxed", or "electronically sent". You will need to get another appointment to get these prescribed. 4. Prescription Accuracy: You are responsible for carefully inspecting your prescriptions before leaving our office. Have the discharge nurse carefully go over each prescription with you, before taking them home. Make sure that your name is accurately spelled, that your address is correct. Check the name and dose of your medication to make sure it is accurate. Check the number of pills, and the written instructions to make sure they are clear and accurate. Make sure that you are given enough medication to last until your next medication refill appointment. 5. Taking Medication: Take medication as prescribed. Never take more pills than instructed. Never take medication more frequently than prescribed. Taking less pills or less frequently is permitted and encouraged, when it comes to controlled substances (written prescriptions).  6. Inform other Doctors: Always inform, all of your healthcare providers, of all the medications you take. 7. Pain Medication from other Providers: You are not allowed to accept any additional pain medication from any other Doctor or Healthcare provider. There are two exceptions to this rule. (see below) In the event that you require additional pain medication, you are responsible for notifying us, as stated below. 8. Medication Agreement: You are responsible for carefully reading and following our Medication Agreement. This must be signed before receiving  any prescriptions from our practice. Safely store a copy of your signed Agreement. Violations to the Agreement will result in no further prescriptions. (Additional copies of our Medication Agreement are available upon request.) 9. Laws, Rules, & Regulations: All patients are expected to follow all Federal and [employer], [employer], Rules, [facility]. Ignorance of the Laws does not constitute a valid excuse. The use of any illegal substances is prohibited. 10. Adopted CDC guidelines & recommendations: Target dosing levels will be at or below 60 MME/day. Use of benzodiazepines** is not recommended.  Exceptions: There are only two exceptions to the rule of not receiving pain medications from other Healthcare Providers. 1. Exception #1 (Emergencies): In the event of an emergency (i.e.: accident requiring emergency care), you are allowed to receive additional pain medication. However, you are responsible for: As soon as you are able, call our office [phone], at any time of the day or night, and leave a message stating your name, the date and nature of the emergency, and the name and dose of the medication prescribed. In the event that your call is answered by a member of our staff, make sure to document and save the date, time, and the name of the person that took your information.  2. Exception #2 (Planned Surgery): In the event that you are scheduled by another doctor or dentist to have any type of surgery or procedure, you are allowed (for a period no longer than 30 days), to receive additional pain medication, for the acute post-op  pain. However, in this case, you are responsible for picking up a copy of our "Post-op Pain Management for Surgeons" handout, and giving it to your surgeon or dentist. This document is available at our office, and does not require an appointment to obtain it. Simply go to our office during business hours (Monday-Thursday from 8:00 AM to 4:00 PM) (Friday 8:00 AM to 12:00  Noon) or if you have a scheduled appointment with [place], prior to your surgery, and ask for it by name. In addition, you will need to provide [place] with your name, name of your surgeon, type of surgery, and date of procedure or surgery.  *Opioid medications include: morphine, codeine, oxycodone, oxymorphone, hydrocodone, hydromorphone, meperidine, tramadol, tapentadol, buprenorphine, fentanyl, methadone. **Benzodiazepine medications include: diazepam (Valium), alprazolam (Xanax), clonazepam (Klonopine), lorazepam (Ativan), clorazepate (Tranxene), chlordiazepoxide (Librium), estazolam (Prosom), oxazepam (Serax), temazepam (Restoril), triazolam (Halcion) (Last updated: 05/01/2017) ____________________________________________________________________________________________    BMI Assessment: Estimated body mass index is 30.85 kg/m as calculated from the following:   Height as of this encounter: _0  (1.778 m).   Weight as of this encounter: 215 lb (97.5 kg).  BMI interpretation table: BMI level Category Range association with higher incidence of chronic pain  <18 kg/m2 Underweight   18.5-24.9 kg/m2 Ideal body weight   25-29.9 kg/m2 Overweight Increased incidence by 20%  30-34.9 kg/m2 Obese (Class I) Increased incidence by 68%  35-39.9 kg/m2 Severe obesity (Class II) Increased incidence by 136%  >40 kg/m2 Extreme obesity (Class III) Increased incidence by 254%   Patient's current BMI Ideal Body weight  Body mass index is 30.85 kg/m. Ideal body weight: 73 kg (160 lb 15 oz) Adjusted ideal body weight: 82.8 kg (182 lb 9 oz)   BMI Readings from Last 4 Encounters:  07/21/17 30.85 kg/m  06/26/17 32.90 kg/m  06/03/17 33.61 kg/m  04/25/17 32.86 kg/m   Wt Readings from Last 4 Encounters:  07/21/17 215 lb (97.5 kg)  06/26/17 229 lb 5 oz (104 kg)  06/03/17 234 lb 4 oz (106.3 kg)  04/25/17 229 lb (103.9 kg)

## 2017-07-25 ENCOUNTER — Telehealth: Payer: Self-pay

## 2017-07-25 DIAGNOSIS — M25552 Pain in left hip: Secondary | ICD-10-CM

## 2017-07-25 NOTE — Telephone Encounter (Signed)
Spoke with patient. Patient asked how much and I explained that I did not know, it depended on a lot of things. Patient said he was starting to hurt worse and it wouldn't hurt to [place]/d appt. Told patient we'd send referral to [facility] today if he'd like and patient agreed.

## 2017-07-25 NOTE — Telephone Encounter (Signed)
Order in. Please remind him he has to go through his private insurance for this, not his workers comp

## 2017-07-25 NOTE — Telephone Encounter (Signed)
Called to reschedule awv, pateint requested update on referral for orthopedic surgeon. Will forward to Dr.Johnson and Alger Memos for pt call back.

## 2017-07-25 NOTE — Telephone Encounter (Signed)
Patient aware.

## 2017-07-25 NOTE — Addendum Note (Signed)
Addended by: [name] on: 07/25/2017 03:54 PM   Modules accepted: Orders

## 2017-07-25 NOTE — Telephone Encounter (Signed)
There is an issue with his insurance as it's out of state workman's comp. He was supposed to call his insurance and see who he can see. If he'd like to go to the ortho not on the workman's comp claim, I can get him into [facility] ASAP. Otherwise, he needs to call us with who he can see.

## 2017-07-27 LAB — TOXASSURE SELECT 13 (MW), URINE

## 2017-08-04 ENCOUNTER — Ambulatory Visit (INDEPENDENT_AMBULATORY_CARE_PROVIDER_SITE_OTHER): Payer: Medicare Other

## 2017-08-04 VITALS — BP 138/82 | HR 79 | Temp 98.3°F | Resp 17 | Ht 68.5 in | Wt 233.3 lb

## 2017-08-04 DIAGNOSIS — Z0000 Encounter for general adult medical examination without abnormal findings: Secondary | ICD-10-CM

## 2017-08-04 NOTE — Progress Notes (Signed)
Subjective:   Kristopher [name] is a 57 y.o. male who presents for Medicare Annual/Subsequent preventive examination.  Review of Systems:   Cardiac Risk Factors include: advanced age (>34men, >74 women);hypertension;male gender;obesity (BMI >30kg/m2)     Objective:    Vitals: BP 138/82 (BP Location: Left Arm, Patient Position: Sitting)   Pulse 79   Temp 98.3 F (36.8 C) (Temporal)   Resp 17   Ht 5' 8.5" (1.74 m)   Wt 233 lb 4.8 oz (105.8 kg)   BMI 34.96 kg/m   Body mass index is 34.96 kg/m.  Advanced Directives 08/04/2017 04/03/2017 10/10/2016 07/10/2016 06/24/2016 04/16/2016 02/19/2016  Does Patient Have a Medical Advance Directive? No No;Yes Yes No No No Yes  Type of Advance Directive - [place];Living will Living will - - - Living will  Does patient want to make changes to medical advance directive? Yes (MAU/Ambulatory/Procedural Areas - Information given) - - - - No - Patient declined -  Copy of [place] in Chart? - No - copy requested - - - - No - copy requested  Would patient like information on creating a medical advance directive? - - - - Yes (MAU/Ambulatory/Procedural Areas - Information given) - -    Tobacco Social History   Tobacco Use  Smoking Status Current Every Day Smoker  . Packs/day: 0.75  . Years: 35.00  . Pack years: 26.25  . Types: Cigarettes  Smokeless Tobacco Never Used     Ready to quit: Yes Counseling given: Yes   Clinical Intake:  Pre-visit preparation completed: Yes  Pain : 0-10 Pain Score: 6  Pain Type: Chronic pain Pain Location: Hip Pain Orientation: Left Pain Descriptors / Indicators: Aching(took pain meds at 530 am ) Pain Onset: More than a month ago Pain Frequency: Constant     Nutritional Status: BMI > 30  Obese Nutritional Risks: None Diabetes: No  How often do you need to have someone help you when you read instructions, pamphlets, or other written materials from your doctor or  pharmacy?: 1 - Never What is the last grade level you completed in school?: 11th grade  Interpreter Needed?: No  Information entered by :: Shuntay Everetts,LPN   Past Medical History:  Diagnosis Date  . Allergy   . Arthritis    left foot  . Chronic back pain    Four rods in back  . Hypertension   . Migraines    daily   Past Surgical History:  Procedure Laterality Date  . APPENDECTOMY    . BACK SURGERY    . COLONOSCOPY WITH PROPOFOL N/A 02/19/2016   Procedure: COLONOSCOPY WITH PROPOFOL;  Surgeon: Lucilla Lame, MD;  Location: [facility];  Service: Endoscopy;  Laterality: N/A;  . FOOT SURGERY Left    Screws and plates  . KNEE SURGERY Left    X 2  . LEG SURGERY    . POLYPECTOMY N/A 02/19/2016   Procedure: POLYPECTOMY;  Surgeon: Lucilla Lame, MD;  Location: [facility];  Service: Endoscopy;  Laterality: N/A;   Family History  Problem Relation Age of Onset  . Cancer Father   . Diabetes Sister   . Thrombosis Sister    Social History   Socioeconomic History  . Marital status: Significant Other    Spouse name: Not on file  . Number of children: Not on file  . Years of education: Not on file  . Highest education level: Not on file  Occupational History  .  Not on file  Social Needs  . Financial resource strain: Not hard at all  . Food insecurity:    Worry: Never true    Inability: Never true  . Transportation needs:    Medical: No    Non-medical: No  Tobacco Use  . Smoking status: Current Every Day Smoker    Packs/day: 0.75    Years: 35.00    Pack years: 26.25    Types: Cigarettes  . Smokeless tobacco: Never Used  Substance and Sexual Activity  . Alcohol use: No    Alcohol/week: 0.0 oz  . Drug use: No  . Sexual activity: Yes  Lifestyle  . Physical activity:    Days per week: 0 days    Minutes per session: 0 min  . Stress: Not at all  Relationships  . Social connections:    Talks on phone: More than three times a week    Gets together: More  than three times a week    Attends religious service: More than 4 times per year    Active member of club or organization: No    Attends meetings of clubs or organizations: Never    Relationship status: Living with partner  Other Topics Concern  . Not on file  Social History Narrative  . Not on file    Outpatient Encounter Medications as of 08/04/2017  Medication Sig  . albuterol (PROVENTIL HFA;VENTOLIN HFA) 108 (90 Base) MCG/ACT inhaler Inhale 2 puffs into the lungs every 6 (six) hours as needed for wheezing or shortness of breath.  [name] amLODipine-valsartan (EXFORGE) 10-320 MG tablet Take 1 tablet by mouth daily.  . baclofen (LIORESAL) 20 MG tablet Take 1 tablet (20 mg total) by mouth at bedtime as needed for muscle spasms.  . meloxicam (MOBIC) 15 MG tablet Take 1 tablet (15 mg total) by mouth daily.  . metoprolol succinate (TOPROL-XL) 100 MG 24 hr tablet Take 100 mg by mouth daily. Take with or immediately following a meal.  . omeprazole (PRILOSEC) 20 MG capsule Take 1 capsule (20 mg total) by mouth daily.  . Oxycodone HCl 10 MG TABS Take 1 tablet (10 mg total) by mouth every 6 (six) hours as needed.  . SUMAtriptan (IMITREX) 100 MG tablet Take 1 tablet earliest onset of headache.  May repeat once after 2 hours if headache persists or recurs.  Do not exceed 2 tablets in 24 hrs  . doxazosin (CARDURA) 4 MG tablet Take 1 tablet (4 mg total) by mouth daily. (Patient not taking: Reported on 08/04/2017)  . hydrochlorothiazide (HYDRODIURIL) 25 MG tablet Take 1 tablet (25 mg total) by mouth daily. (Patient not taking: Reported on 08/04/2017)  . [START ON 09/30/2017] Oxycodone HCl 10 MG TABS Take 1 tablet (10 mg total) by mouth every 6 (six) hours as needed. (Patient not taking: Reported on 08/04/2017)  . [START ON 08/31/2017] Oxycodone HCl 10 MG TABS Take 1 tablet (10 mg total) by mouth every 6 (six) hours as needed. (Patient not taking: Reported on 08/04/2017)   No facility-administered encounter medications  on file as of 08/04/2017.     Activities of Daily Living In your present state of health, do you have any difficulty performing the following activities: 08/04/2017  Hearing? N  Vision? N  Difficulty concentrating or making decisions? N  Walking or climbing stairs? Y  Dressing or bathing? N  Doing errands, shopping? N  Preparing Food and eating ? N  Using the Toilet? N  In the past six months, have  you accidently leaked urine? N  Do you have problems with loss of bowel control? N  Managing your Medications? N  Managing your Finances? N  Housekeeping or managing your Housekeeping? N  Some recent data might be hidden    Patient Care Team: [name], DO as PCP - General (Family Medicine) Gavin Pound, CMA (Inactive) as Certified Medical Assistant Guadalupe Maple, MD as Attending Physician (Family Medicine)   Assessment:   This is a routine wellness examination for [place].  Exercise Activities and Dietary recommendations Current Exercise Habits: The patient does not participate in regular exercise at present, Exercise limited by: orthopedic condition(s)  Goals    . Quit Smoking     Smoking cessation discussed       Fall Risk Fall Risk  08/04/2017 07/21/2017 04/03/2017 01/08/2017 10/10/2016  Falls in the past year? Yes Yes Yes Yes Yes  Comment - - - - -  Number falls in past yr: 2 or more 1 2 or more 2 or more 1  Comment - - - patient states that occassionally his left leg will lock up and go out from under him.  has difficulty sometimes lifting the leg -  Injury with Fall? Yes - No No No  Risk Factor Category  High Fall Risk - - - -  Risk for fall due to : - Impaired balance/gait Other (Comment) - -  Risk for fall due to: Comment - - left leg goes numb - -  Follow up Falls prevention discussed Falls prevention discussed Falls prevention discussed - -   Is the patient's home free of loose throw rugs in walkways, pet beds, electrical cords, etc?   yes      Grab bars  in the bathroom? no      Handrails on the stairs?   yes      Adequate lighting?   yes  Timed Get Up and Go Performed: Completed in 8 seconds with no use of assistive devices, steady gait. No intervention needed at this time.   Depression Screen PHQ 2/9 Scores 08/04/2017 07/21/2017 01/08/2017 10/10/2016  PHQ - 2 Score 2 0 0 0  PHQ- 9 Score 6 - - -  Exception Documentation - - - -    Cognitive Function     6CIT Screen 08/04/2017 06/24/2016  What Year? 0 points 0 points  What month? 0 points 0 points  What time? 0 points 0 points  Count back from 20 0 points 0 points  Months in reverse 0 points 4 points  Repeat phrase 2 points 4 points  Total Score 2 8    Immunization History  Administered Date(s) Administered  . Pneumococcal Polysaccharide-23 06/24/2016  . Tdap 03/04/2013, 06/03/2017    Qualifies for Shingles Vaccine? Yes, discussed shingrix vaccine   Screening Tests Health Maintenance  Topic Date Due  . INFLUENZA VACCINE  10/02/2017  . COLONOSCOPY  02/18/2021  . TETANUS/TDAP  06/04/2027  . Hepatitis C Screening  Completed  . HIV Screening  Completed   Cancer Screenings: Lung: Low Dose CT Chest recommended if Age 55-80 years, 30 pack-year currently smoking OR have quit w/in 15years. Patient does qualify. Will send message to Burgess Estelle, RN - Oncology Navigator too see about qualification  Colorectal: completed 02/19/2016  Additional Screenings:  Hepatitis C Screening: completed 06/24/2016      Plan:  I have personally reviewed and addressed the Medicare Annual Wellness questionnaire and have noted the following in the patient's chart:  A. Medical  and social history B. Use of alcohol, tobacco or illicit drugs  C. Current medications and supplements D. Functional ability and status E.  Nutritional status F.  Physical activity G. Advance directives H. List of other physicians I.  Hospitalizations, surgeries, and ER visits in previous 12 months J.   [place] such as hearing and vision if needed, cognitive and depression L. Referrals and appointments   In addition, I have reviewed and discussed with patient certain preventive protocols, quality metrics, and best practice recommendations. A written personalized care plan for preventive services as well as general preventive health recommendations were provided to patient.   Signed,  Tyler Aas, LPN Nurse Health Advisor   Nurse Notes:none

## 2017-08-04 NOTE — Patient Instructions (Addendum)
[name] , Thank you for taking time to come for your Medicare Wellness Visit. I appreciate your ongoing commitment to your health goals. Please review the following plan we discussed and let me know if I can assist you in the future.   Screening recommendations/referrals: Colonoscopy: completed 02/19/2016 Recommended yearly ophthalmology/optometry visit for glaucoma screening and checkup Recommended yearly dental visit for hygiene and checkup  Vaccinations: Influenza vaccine: due 11/2017 Pneumococcal vaccine: prevnar 13 due at age 30 Tdap vaccine: up to date Shingles vaccine: shingrix eligible, check with your insurance company for coverage   Advanced directives: Advance directive discussed with you today. I have provided a copy for you to complete at home and have notarized. Once this is complete please bring a copy in to our office so we can scan it into your chart.  Conditions/risks identified: Smoking cessation discussed  Next appointment: Follow up in one year for your annual wellness exam.   Preventive Care 40-64 Years, Male Preventive care refers to lifestyle choices and visits with your health care provider that can promote health and wellness. What does preventive care include?  A yearly physical exam. This is also called an annual well check.  Dental exams once or twice a year.  Routine eye exams. Ask your health care provider how often you should have your eyes checked.  Personal lifestyle choices, including:  Daily care of your teeth and gums.  Regular physical activity.  Eating a healthy diet.  Avoiding tobacco and drug use.  Limiting alcohol use.  Practicing safe sex.  Taking low-dose aspirin every day starting at age 52. What happens during an annual well check? The services and screenings done by your health care provider during your annual well check will depend on your age, overall health, lifestyle risk factors, and family history of  disease. Counseling  Your health care provider may ask you questions about your:  Alcohol use.  Tobacco use.  Drug use.  Emotional well-being.  Home and relationship well-being.  Sexual activity.  Eating habits.  Work and work Statistician. Screening  You may have the following tests or measurements:  Height, weight, and BMI.  Blood pressure.  Lipid and cholesterol levels. These may be checked every 5 years, or more frequently if you are over 74 years old.  Skin check.  Lung cancer screening. You may have this screening every year starting at age 8 if you have a 30-pack-year history of smoking and currently smoke or have quit within the past 15 years.  Fecal occult blood test (FOBT) of the stool. You may have this test every year starting at age 57.  Flexible sigmoidoscopy or colonoscopy. You may have a sigmoidoscopy every 5 years or a colonoscopy every 10 years starting at age 7.  Prostate cancer screening. Recommendations will vary depending on your family history and other risks.  Hepatitis C blood test.  Hepatitis B blood test.  Sexually transmitted disease (STD) testing.  Diabetes screening. This is done by checking your blood sugar (glucose) after you have not eaten for a while (fasting). You may have this done every 1-3 years. Discuss your test results, treatment options, and if necessary, the need for more tests with your health care provider. Vaccines  Your health care provider may recommend certain vaccines, such as:  Influenza vaccine. This is recommended every year.  Tetanus, diphtheria, and acellular pertussis (Tdap, Td) vaccine. You may need a Td booster every 10 years.  Zoster vaccine. You may need this after age 63.  Pneumococcal 13-valent conjugate (PCV13) vaccine. You may need this if you have certain conditions and have not been vaccinated.  Pneumococcal polysaccharide (PPSV23) vaccine. You may need one or two doses if you smoke cigarettes  or if you have certain conditions. Talk to your health care provider about which screenings and vaccines you need and how often you need them. This information is not intended to replace advice given to you by your health care provider. Make sure you discuss any questions you have with your health care provider. Document Released: 03/17/2015 Document Revised: 11/08/2015 Document Reviewed: 12/20/2014 Elsevier Interactive Patient Education  2017 [place] Prevention in the Home Falls can cause injuries. They can happen to people of all ages. There are many things you can do to make your home safe and to help prevent falls. What can I do on the outside of my home?  Regularly fix the edges of walkways and driveways and fix any cracks.  Remove anything that might make you trip as you walk through a door, such as a raised step or threshold.  Trim any bushes or trees on the path to your home.  Use bright outdoor lighting.  Clear any walking paths of anything that might make someone trip, such as rocks or tools.  Regularly check to see if handrails are loose or broken. Make sure that both sides of any steps have handrails.  Any raised decks and porches should have guardrails on the edges.  Have any leaves, snow, or ice cleared regularly.  Use sand or salt on walking paths during winter.  Clean up any spills in your garage right away. This includes oil or grease spills. What can I do in the bathroom?  Use night lights.  Install grab bars by the toilet and in the tub and shower. Do not use towel bars as grab bars.  Use non-skid mats or decals in the tub or shower.  If you need to sit down in the shower, use a plastic, non-slip stool.  Keep the floor dry. Clean up any water that spills on the floor as soon as it happens.  Remove soap buildup in the tub or shower regularly.  Attach bath mats securely with double-sided non-slip rug tape.  Do not have throw rugs and other  things on the floor that can make you trip. What can I do in the bedroom?  Use night lights.  Make sure that you have a light by your bed that is easy to reach.  Do not use any sheets or blankets that are too big for your bed. They should not hang down onto the floor.  Have a firm chair that has side arms. You can use this for support while you get dressed.  Do not have throw rugs and other things on the floor that can make you trip. What can I do in the kitchen?  Clean up any spills right away.  Avoid walking on wet floors.  Keep items that you use a lot in easy-to-reach places.  If you need to reach something above you, use a strong step stool that has a grab bar.  Keep electrical cords out of the way.  Do not use floor polish or wax that makes floors slippery. If you must use wax, use non-skid floor wax.  Do not have throw rugs and other things on the floor that can make you trip. What can I do with my stairs?  Do not leave any items on the  stairs.  Make sure that there are handrails on both sides of the stairs and use them. Fix handrails that are broken or loose. Make sure that handrails are as long as the stairways.  Check any carpeting to make sure that it is firmly attached to the stairs. Fix any carpet that is loose or worn.  Avoid having throw rugs at the top or bottom of the stairs. If you do have throw rugs, attach them to the floor with carpet tape.  Make sure that you have a light switch at the top of the stairs and the bottom of the stairs. If you do not have them, ask someone to add them for you. What else can I do to help prevent falls?  Wear shoes that:  Do not have high heels.  Have rubber bottoms.  Are comfortable and fit you well.  Are closed at the toe. Do not wear sandals.  If you use a stepladder:  Make sure that it is fully opened. Do not climb a closed stepladder.  Make sure that both sides of the stepladder are locked into place.  Ask  someone to hold it for you, if possible.  Clearly mark and make sure that you can see:  Any grab bars or handrails.  First and last steps.  Where the edge of each step is.  Use tools that help you move around (mobility aids) if they are needed. These include:  Canes.  Walkers.  Scooters.  Crutches.  Turn on the lights when you go into a dark area. Replace any light bulbs as soon as they burn out.  Set up your furniture so you have a clear path. Avoid moving your furniture around.  If any of your floors are uneven, fix them.  If there are any pets around you, be aware of where they are.  Review your medicines with your doctor. Some medicines can make you feel dizzy. This can increase your chance of falling. Ask your doctor what other things that you can do to help prevent falls. This information is not intended to replace advice given to you by your health care provider. Make sure you discuss any questions you have with your health care provider. Document Released: 12/15/2008 Document Revised: 07/27/2015 Document Reviewed: 03/25/2014 Elsevier Interactive Patient Education  2017 [employer].

## 2017-08-05 ENCOUNTER — Telehealth: Payer: Self-pay | Admitting: *Deleted

## 2017-08-05 DIAGNOSIS — Z122 Encounter for screening for malignant neoplasm of respiratory organs: Secondary | ICD-10-CM

## 2017-08-05 DIAGNOSIS — Z87891 Personal history of nicotine dependence: Secondary | ICD-10-CM

## 2017-08-05 NOTE — Telephone Encounter (Signed)
Received referral for initial lung cancer screening scan. Contacted patient and obtained smoking history,(current, 35 pack year) as well as answering questions related to screening process. Patient denies signs of lung cancer such as weight loss or hemoptysis. Patient denies comorbidity that would prevent curative treatment if lung cancer were found. Patient is scheduled for shared decision making visit and CT scan on 08/21/17.

## 2017-08-13 ENCOUNTER — Other Ambulatory Visit: Payer: Self-pay

## 2017-08-13 ENCOUNTER — Emergency Department: Payer: Medicare Other

## 2017-08-13 ENCOUNTER — Observation Stay
Admission: EM | Admit: 2017-08-13 | Discharge: 2017-08-14 | Disposition: A | Discharge status: Home / Self Care | Payer: Medicare Other | Attending: Internal Medicine | Admitting: Internal Medicine

## 2017-08-13 ENCOUNTER — Encounter: Payer: Self-pay | Admitting: *Deleted

## 2017-08-13 DIAGNOSIS — I10 Essential (primary) hypertension: Secondary | ICD-10-CM | POA: Diagnosis not present

## 2017-08-13 DIAGNOSIS — R52 Pain, unspecified: Secondary | ICD-10-CM

## 2017-08-13 DIAGNOSIS — M1612 Unilateral primary osteoarthritis, left hip: Principal | ICD-10-CM | POA: Insufficient documentation

## 2017-08-13 DIAGNOSIS — Z886 Allergy status to analgesic agent status: Secondary | ICD-10-CM | POA: Diagnosis not present

## 2017-08-13 DIAGNOSIS — Z79899 Other long term (current) drug therapy: Secondary | ICD-10-CM | POA: Insufficient documentation

## 2017-08-13 DIAGNOSIS — F17210 Nicotine dependence, cigarettes, uncomplicated: Secondary | ICD-10-CM | POA: Insufficient documentation

## 2017-08-13 DIAGNOSIS — G894 Chronic pain syndrome: Secondary | ICD-10-CM | POA: Diagnosis present

## 2017-08-13 DIAGNOSIS — M50122 Cervical disc disorder at C5-C6 level with radiculopathy: Secondary | ICD-10-CM | POA: Insufficient documentation

## 2017-08-13 DIAGNOSIS — R531 Weakness: Secondary | ICD-10-CM

## 2017-08-13 DIAGNOSIS — Z8673 Personal history of transient ischemic attack (TIA), and cerebral infarction without residual deficits: Secondary | ICD-10-CM | POA: Diagnosis not present

## 2017-08-13 DIAGNOSIS — Z880 Allergy status to penicillin: Secondary | ICD-10-CM | POA: Diagnosis not present

## 2017-08-13 DIAGNOSIS — M5126 Other intervertebral disc displacement, lumbar region: Secondary | ICD-10-CM | POA: Insufficient documentation

## 2017-08-13 DIAGNOSIS — K219 Gastro-esophageal reflux disease without esophagitis: Secondary | ICD-10-CM | POA: Diagnosis present

## 2017-08-13 DIAGNOSIS — M6281 Muscle weakness (generalized): Secondary | ICD-10-CM | POA: Diagnosis not present

## 2017-08-13 LAB — CBC
HCT: 40.3 % (ref 40.0–52.0)
Hemoglobin: 14.2 g/dL (ref 13.0–18.0)
MCH: 30.9 pg (ref 26.0–34.0)
MCHC: 35.2 g/dL (ref 32.0–36.0)
MCV: 87.7 fL (ref 80.0–100.0)
Platelets: 173 10*3/uL (ref 150–440)
RBC: 4.59 MIL/uL (ref 4.40–5.90)
RDW: 13.7 % (ref 11.5–14.5)
WBC: 7.8 10*3/uL (ref 3.8–10.6)

## 2017-08-13 LAB — BASIC METABOLIC PANEL
Anion gap: 11 (ref 5–15)
BUN: 13 mg/dL (ref 6–20)
CO2: 25 mmol/L (ref 22–32)
Calcium: 9.4 mg/dL (ref 8.9–10.3)
Chloride: 102 mmol/L (ref 101–111)
Creatinine, Ser: 0.92 mg/dL (ref 0.61–1.24)
GFR calc Af Amer: >60 mL/min (ref >60)
GFR calc non Af Amer: >60 mL/min (ref >60)
Glucose, Bld: 142 mg/dL — ABNORMAL HIGH (ref 65–99)
Potassium: 3.3 mmol/L — ABNORMAL LOW (ref 3.5–5.1)
Sodium: 138 mmol/L (ref 135–145)

## 2017-08-13 LAB — URINALYSIS, COMPLETE (UACMP) WITH MICROSCOPIC
Bacteria, UA: NONE SEEN
Bilirubin Urine: NEGATIVE
Glucose, UA: NEGATIVE mg/dL
Hgb urine dipstick: NEGATIVE
Ketones, ur: NEGATIVE mg/dL
Leukocytes, UA: NEGATIVE
Nitrite: NEGATIVE
Protein, ur: NEGATIVE mg/dL
Specific Gravity, Urine: 1.023 (ref 1.005–1.030)
Squamous Epithelial / LPF: NONE SEEN (ref 0–5)
pH: 7 (ref 5.0–8.0)

## 2017-08-13 NOTE — ED Notes (Signed)
Patient transported to MRI 

## 2017-08-13 NOTE — ED Notes (Signed)
Patient transported to CT 

## 2017-08-13 NOTE — ED Notes (Signed)
Pt reports weakness in left leg and left arm for 3 days.  No slurred speech.  No headache.  Pt states it feels like something is pulling in left upper leg /hip area.  No known injury.   Pt alert.  Speech clear.

## 2017-08-13 NOTE — ED Notes (Signed)
Pt given specimen cup, and instructed to let us know when he is able to give [place] a sample

## 2017-08-13 NOTE — ED Triage Notes (Addendum)
Pt to ED reporting sudden onset of left sided pain and weakness three days ago. Pain is located mostly in left chest but also radiates throughout entire left side of body. Pain is worse when moving. Pt has weakness noted to the left arm and leg. With blurred vision also reported in both eyes. No aphasia,slurred speech or neglect noted.   Pt fell 32 ft in 2014 and was told symptoms like this could happen due to spinal injuries but reports the pain has become worse over the past three days and has progressed to him falling repeatedly this week. (no new head trauma since onset of symptoms)

## 2017-08-13 NOTE — ED Notes (Signed)
Pt alert.  Family with pt   nsr on monitor.

## 2017-08-13 NOTE — ED Provider Notes (Signed)
[facility] Emergency Department Provider Note   ____________________________________________   First MD Initiated Contact with Patient 08/13/17 2015     (approximate)  I have reviewed the triage vital signs and the nursing notes.   HISTORY  Chief Complaint Weakness and Left sided pain    HPI Kristopher [name] is a 57 y.o. male who reports 3 days ago he developed left-sided pain that feels like somebody is shooting or stabbing him and clumsiness of the left arm weakness of the left arm weakness in the left leg.  This is seem to get worse.  Today he fell and could not get up until he crawled to a chair and kind of hoist himself up on the chair.  He reports he is dropping things with his left hand and again use of the hand or leg makes those extremities very painful.  Patient also reports he had a headache yesterday but this went away after he took his headache pill.  She denies any neck pain.  Past Medical History:  Diagnosis Date  . Allergy   . Arthritis    left foot  . Chronic back pain    Four rods in back  . Hypertension   . Migraines    daily    Patient Active Problem List   Diagnosis Date Noted  . Musculoskeletal pain, chronic 04/03/2017  . Tobacco abuse 06/24/2016  . GERD (gastroesophageal reflux disease) 06/24/2016  . Special screening for malignant neoplasms, colon   . Benign neoplasm of ascending colon   . Polyp of sigmoid colon   . Rectal polyp   . Benign hypertensive renal disease 01/22/2016  . Migraine 01/22/2016  . Chronic pain syndrome 01/11/2016  . Chronic sacroiliac joint pain (Bilateral) (L>R) 05/31/2015  . Allergy history, anesthetic (Unconfirmed allergy to Lidocaine) 05/31/2015  . Chronic hip pain (Left) 05/31/2015  . Lumbar facet syndrome (Location of Primary Source of Pain) (Bilateral) (L>R) 05/31/2015  . Chronic lower extremity pain (Location of Secondary source of pain) (Left) 05/31/2015  . Chronic upper extremity pain  (Location of Tertiary source of pain) (Bilateral) (L>R) 05/31/2015  . Greater occipital neuralgia (Right) 05/31/2015  . Retrolisthesis of L5-S1 02/06/2015  . Cervical disc herniation (C4-5 and C5-6) 02/06/2015  . Lumbar disc herniation (L5-S1) 02/06/2015  . Hypokalemia 02/01/2015  . Cervical spinal stenosis (C4-5) 01/06/2015  . Cervical foraminal stenosis (Bilateral C5-6) 01/06/2015  . Chronic low back pain (Location of Primary Source of Pain) (Bilateral) (L>R) 01/05/2015  . Lumbar spondylosis 01/05/2015  . Chronic lumbar radicular pain (Location of Secondary source of pain) (S1 dermatomal) (Left) 01/05/2015  . 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) 01/05/2015  . Chronic neck pain (posterior midline) (Bilateral) (L>R) 01/05/2015  . Cervical spondylosis 01/05/2015  . Chronic cervical radicular pain (Location of Tertiary source of pain) (Bilateral) (C5/C6 dermatome) (L>R) 01/05/2015  . Long term current use of opiate analgesic 01/05/2015  . Long term prescription opiate use 01/05/2015  . Opiate use (60 MME/Day) 01/05/2015  . Opioid dependence ([place]) 01/05/2015  . Encounter for therapeutic drug level monitoring 01/05/2015    Past Surgical History:  Procedure Laterality Date  . APPENDECTOMY    . BACK SURGERY    . COLONOSCOPY WITH PROPOFOL N/A 02/19/2016   Procedure: COLONOSCOPY WITH PROPOFOL;  Surgeon: Lucilla Lame, MD;  Location: [place];  Service: Endoscopy;  Laterality: N/A;  . FOOT SURGERY Left    Screws and plates  . KNEE SURGERY Left  X 2  . LEG SURGERY    . POLYPECTOMY N/A 02/19/2016   Procedure: POLYPECTOMY;  Surgeon: Lucilla Lame, MD;  Location: [place];  Service: Endoscopy;  Laterality: N/A;    Prior to Admission medications   Medication Sig Start Date End Date Taking? Authorizing Provider  albuterol (PROVENTIL HFA;VENTOLIN HFA) 108 (90 Base) MCG/ACT inhaler Inhale 2 puffs into the lungs every 6 (six) hours as needed for  wheezing or shortness of breath. 12/02/16   Volney American, PA-C  amLODipine-valsartan (EXFORGE) 10-320 MG tablet Take 1 tablet by mouth daily. 06/24/16   Johnson, [name], DO  baclofen (LIORESAL) 20 MG tablet Take 1 tablet (20 mg total) by mouth at bedtime as needed for muscle spasms. 06/03/17   Johnson, [name], DO  doxazosin (CARDURA) 4 MG tablet Take 1 tablet (4 mg total) by mouth daily. Patient not taking: Reported on 08/04/2017 04/25/17   Minna Merritts, MD  hydrochlorothiazide (HYDRODIURIL) 25 MG tablet Take 1 tablet (25 mg total) by mouth daily. Patient not taking: Reported on 08/04/2017 03/31/17   Park Liter P, DO  meloxicam (MOBIC) 15 MG tablet Take 1 tablet (15 mg total) by mouth daily. 06/03/17   Johnson, [name], DO  metoprolol succinate (TOPROL-XL) 100 MG 24 hr tablet Take 100 mg by mouth daily. Take with or immediately following a meal.    [provider]  omeprazole (PRILOSEC) 20 MG capsule Take 1 capsule (20 mg total) by mouth daily. 06/24/16   Johnson, [name], DO  Oxycodone HCl 10 MG TABS Take 1 tablet (10 mg total) by mouth every 6 (six) hours as needed. Patient not taking: Reported on 08/04/2017 09/30/17 10/30/17  Vevelyn Francois, NP  Oxycodone HCl 10 MG TABS Take 1 tablet (10 mg total) by mouth every 6 (six) hours as needed. Patient not taking: Reported on 08/04/2017 08/31/17 09/30/17  Vevelyn Francois, NP  Oxycodone HCl 10 MG TABS Take 1 tablet (10 mg total) by mouth every 6 (six) hours as needed. 08/01/17 08/31/17  Vevelyn Francois, NP  SUMAtriptan (IMITREX) 100 MG tablet Take 1 tablet earliest onset of headache.  May repeat once after 2 hours if headache persists or recurs.  Do not exceed 2 tablets in 24 hrs 11/27/16   Park Liter P, DO    Allergies Lidocaine; Penicillins; Strawberry (diagnostic); Aspirin; and Novocain [procaine]  Family History  Problem Relation Age of Onset  . Cancer Father   . Diabetes Sister   . Thrombosis Sister     Social History Social  History   Tobacco Use  . Smoking status: Current Every Day Smoker    Packs/day: 0.75    Years: 35.00    Pack years: 26.25    Types: Cigarettes  . Smokeless tobacco: Never Used  Substance Use Topics  . Alcohol use: No    Alcohol/week: 0.0 oz  . Drug use: No    Review of Systems  Constitutional: No fever/chills Eyes: No visual changes. ENT: No sore throat. Cardiovascular: Denies chest pain. Respiratory: Denies shortness of breath. Gastrointestinal: No abdominal pain.  No nausea, no vomiting.  No diarrhea.  No constipation. Genitourinary: Negative for dysuria. Musculoskeletal: Negative for back pain. Skin: Negative for rash. Neurological: See HPI.   ____________________________________________   PHYSICAL EXAM:  VITAL SIGNS: ED Triage Vitals  Enc Vitals Group     BP 08/13/17 1929 (!) 151/92     Pulse Rate 08/13/17 1929 92     Resp 08/13/17 1929 20  Temp 08/13/17 1929 99.1 F (37.3 C)     Temp Source 08/13/17 1929 Oral     SpO2 08/13/17 1929 97 %     Weight 08/13/17 1921 233 lb (105.7 kg)     Height 08/13/17 1921 5' 8.5" (1.74 m)     Head Circumference --      Peak Flow --      Pain Score 08/13/17 1921 9     Pain Loc --      Pain Edu? --      Excl. in [place]? --     Constitutional: Alert and oriented. Well appearing and in no acute distress. Eyes: Conjunctivae are normal. PERRL. EOMI. Head: Atraumatic. Nose: No congestion/rhinnorhea. Mouth/Throat: Mucous membranes are moist.  Oropharynx non-erythematous. Neck: No stridor.  Cardiovascular: Normal rate, regular rhythm. Grossly normal heart sounds.  Good peripheral circulation. Respiratory: Normal respiratory effort.  No retractions. Lungs CTAB. Gastrointestinal: Soft and nontender. No distention. No abdominal bruits. No CVA tenderness. Musculoskeletal: No lower extremity tenderness nor edema.  No joint effusions. Neurologic:  Normal speech and language.  Cranial nerves II through XII appear to be intact  cerebellar finger-nose rapid alternating movements and hands are normal there does not appear to be any marked weakness in the left extremity possibly just a slow small amount.  But use of the left extremity makes him feel like somebody stabbing him all up and down the extremity.  Left leg is quite weak he cannot lift it up off the bed.  Touching it and moving it makes it hurt as well. Skin:  Skin is warm, dry and intact. No rash noted. Psychiatric: Mood and affect are normal. Speech and behavior are normal.  ____________________________________________   LABS (all labs ordered are listed, but only abnormal results are displayed)  Labs Reviewed  BASIC METABOLIC PANEL - Abnormal; Notable for the following components:      Result Value   Potassium 3.3 (*)    Glucose, Bld 142 (*)    All other components within normal limits  URINALYSIS, COMPLETE (UACMP) WITH MICROSCOPIC - Abnormal; Notable for the following components:   Color, Urine YELLOW (*)    APPearance CLEAR (*)    All other components within normal limits  CBC  CBG MONITORING, ED   ____________________________________________  EKG EKG read and interpreted by me shows normal sinus rhythm rate of 93 normal axis no acute ST-T wave changes ____________________________________________  RADIOLOGY  ED MD interpretation:  Official radiology report(s): Dg Chest 2 View  Result Date: 08/13/2017 CLINICAL DATA:  57 year old male with sudden onset of left-sided chest pain and weakness 3 days ago. EXAM: CHEST - 2 VIEW COMPARISON:  No priors. FINDINGS: Lung volumes are normal. No consolidative airspace disease. No pleural effusions. No pneumothorax. No pulmonary nodule or mass noted. Pulmonary vasculature and the cardiomediastinal silhouette are within normal limits. IMPRESSION: No radiographic evidence of acute cardiopulmonary disease. Electronically Signed   By: Vinnie Langton M.D.   On: 08/13/2017 20:59   Ct Head Wo Contrast  Result  Date: 08/13/2017 CLINICAL DATA:  Left arm and leg weakness for 3 days. EXAM: CT HEAD WITHOUT CONTRAST TECHNIQUE: Contiguous axial images were obtained from the base of the skull through the vertex without intravenous contrast. COMPARISON:  None. FINDINGS: Brain: No evidence of acute infarction, hemorrhage, hydrocephalus, extra-axial collection, or mass lesion/mass effect. Mild to moderate chronic subcortical white matter disease is seen bilaterally. Vascular:  No hyperdense vessel or other acute findings. Skull: No evidence of fracture or  other significant bone abnormality. Sinuses/Orbits:  No acute findings. Other: None. IMPRESSION: No acute findings. Mild to moderate bilateral subcortical chronic white matter disease. Electronically Signed   By: Earle Gell M.D.   On: 08/13/2017 20:48   Mr Brain Wo Contrast  Result Date: 08/13/2017 CLINICAL DATA:  Initial evaluation for acute onset left-sided pain and weakness with associated blurry vision for 3 days. EXAM: MRI HEAD WITHOUT CONTRAST MRI CERVICAL SPINE WITHOUT CONTRAST TECHNIQUE: Multiplanar, multiecho pulse sequences of the brain and surrounding structures, and cervical spine, to include the craniocervical junction and cervicothoracic junction, were obtained without intravenous contrast. COMPARISON:  Prior CT from earlier the same day. FINDINGS: MRI HEAD FINDINGS Brain: Cerebral volume within normal limits for age. Patchy and confluent T2/FLAIR hyperintensity seen throughout the periventricular, deep, and subcortical white matter of both cerebral hemispheres, moderately advanced for age. Foci are nonspecific, and could reflect sequelae of chronic microvascular ischemic change. Possible demyelinating disease could also have this appearance. No infratentorial foci or brainstem involvement identified. No findings to suggest active demyelination on this noncontrast examination. No evidence for acute or subacute ischemia. Gray-white matter differentiation  maintained. No areas of remote cortical infarction. Small remote lacunar infarct noted at the left caudate head. No evidence for acute or chronic intracranial hemorrhage. No mass lesion, midline shift or mass effect. No hydrocephalus. No extra-axial fluid collection. Major dural sinuses grossly patent. Pituitary gland mildly prominent but within normal limits. Vascular: Major intracranial vascular flow voids maintained. Skull and upper cervical spine: Craniocervical junction normal. Bone marrow signal intensity within normal limits. No scalp soft tissue abnormality. Sinuses/Orbits: Globes and orbital soft tissues within normal limits. Paranasal sinuses are clear. No mastoid effusion. Inner ear structures normal. Other: None. MRI CERVICAL SPINE FINDINGS Alignment: Straightening with slight reversal of the normal cervical lordosis. Trace retrolisthesis of C4 on C5, likely chronic and facet mediated. Vertebrae: Vertebral body heights maintained without evidence for acute or chronic fracture. Bone marrow signal intensity within normal limits. No discrete or worrisome osseous lesions. No abnormal marrow edema. Cord: Signal intensity within the cervical spinal cord is within normal limits. No cord abnormality to suggest demyelinating disease. Posterior Fossa, vertebral arteries, paraspinal tissues: Craniocervical junction normal. Paraspinous and prevertebral soft tissues within normal limits. Normal intravascular flow voids present within the vertebral arteries bilaterally. Disc levels: C2-C3: Mild right-sided facet hypertrophy.  No stenosis. C3-C4: Broad left paracentral disc osteophyte indents the ventral thecal sac, abutting the ventral cord. Mild spinal stenosis without significant cord deformity. Mild bilateral C4 foraminal stenosis. C4-C5: Broad posterior disc osteophyte effaces the ventral thecal sac and flattens the cervical spinal cord. Moderate spinal stenosis. Superimposed uncovertebral spurring with resultant  moderate bilateral C5 foraminal stenosis, right worse than left. C5-C6: Chronic diffuse degenerative disc osteophyte with intervertebral disc space narrowing. Broad posterior component effaces the ventral thecal sac. Mild cord flattening without cord signal changes. Moderate spinal stenosis. Severe right with moderate left C6 foraminal stenosis. C6-C7: Shallow right paracentral disc protrusion indents the ventral thecal sac without contacting the ventral cord. No significant cord deformity. Foramina remain patent. C7-T1:  Mild facet hypertrophy.  No stenosis. Visualized upper thoracic spine within normal limits. IMPRESSION: MRI HEAD IMPRESSION: 1. Moderately advance cerebral white matter changes involving the supratentorial cerebral white matter. Findings are nonspecific, and may be related to accelerated chronic small vessel ischemic disease. Possible demyelinating disease could also have this appearance, and could be considered in the correct clinical setting. No imaging findings to suggest active demyelination on this noncontrast  examination. 2. Otherwise normal brain MRI. No other acute intracranial abnormality identified. MRA HEAD IMPRESSION: 1. No acute abnormality within the cervical spine. Normal MRI appearance of the cervical spinal cord. 2. Moderate cervical spondylolysis at C4-5 and C5-6 with resultant moderate canal stenosis. Moderate bilateral C5 with severe right and moderate left C6 foraminal narrowing. 3. Mild disc bulge at C3-4 with resultant mild canal and bilateral C4 foraminal stenosis. Electronically Signed   By: Jeannine Boga M.D.   On: 08/13/2017 22:39   Mr Cervical Spine Wo Contrast  Result Date: 08/13/2017 CLINICAL DATA:  Initial evaluation for acute onset left-sided pain and weakness with associated blurry vision for 3 days. EXAM: MRI HEAD WITHOUT CONTRAST MRI CERVICAL SPINE WITHOUT CONTRAST TECHNIQUE: Multiplanar, multiecho pulse sequences of the brain and surrounding  structures, and cervical spine, to include the craniocervical junction and cervicothoracic junction, were obtained without intravenous contrast. COMPARISON:  Prior CT from earlier the same day. FINDINGS: MRI HEAD FINDINGS Brain: Cerebral volume within normal limits for age. Patchy and confluent T2/FLAIR hyperintensity seen throughout the periventricular, deep, and subcortical white matter of both cerebral hemispheres, moderately advanced for age. Foci are nonspecific, and could reflect sequelae of chronic microvascular ischemic change. Possible demyelinating disease could also have this appearance. No infratentorial foci or brainstem involvement identified. No findings to suggest active demyelination on this noncontrast examination. No evidence for acute or subacute ischemia. Gray-white matter differentiation maintained. No areas of remote cortical infarction. Small remote lacunar infarct noted at the left caudate head. No evidence for acute or chronic intracranial hemorrhage. No mass lesion, midline shift or mass effect. No hydrocephalus. No extra-axial fluid collection. Major dural sinuses grossly patent. Pituitary gland mildly prominent but within normal limits. Vascular: Major intracranial vascular flow voids maintained. Skull and upper cervical spine: Craniocervical junction normal. Bone marrow signal intensity within normal limits. No scalp soft tissue abnormality. Sinuses/Orbits: Globes and orbital soft tissues within normal limits. Paranasal sinuses are clear. No mastoid effusion. Inner ear structures normal. Other: None. MRI CERVICAL SPINE FINDINGS Alignment: Straightening with slight reversal of the normal cervical lordosis. Trace retrolisthesis of C4 on C5, likely chronic and facet mediated. Vertebrae: Vertebral body heights maintained without evidence for acute or chronic fracture. Bone marrow signal intensity within normal limits. No discrete or worrisome osseous lesions. No abnormal marrow edema. Cord:  Signal intensity within the cervical spinal cord is within normal limits. No cord abnormality to suggest demyelinating disease. Posterior Fossa, vertebral arteries, paraspinal tissues: Craniocervical junction normal. Paraspinous and prevertebral soft tissues within normal limits. Normal intravascular flow voids present within the vertebral arteries bilaterally. Disc levels: C2-C3: Mild right-sided facet hypertrophy.  No stenosis. C3-C4: Broad left paracentral disc osteophyte indents the ventral thecal sac, abutting the ventral cord. Mild spinal stenosis without significant cord deformity. Mild bilateral C4 foraminal stenosis. C4-C5: Broad posterior disc osteophyte effaces the ventral thecal sac and flattens the cervical spinal cord. Moderate spinal stenosis. Superimposed uncovertebral spurring with resultant moderate bilateral C5 foraminal stenosis, right worse than left. C5-C6: Chronic diffuse degenerative disc osteophyte with intervertebral disc space narrowing. Broad posterior component effaces the ventral thecal sac. Mild cord flattening without cord signal changes. Moderate spinal stenosis. Severe right with moderate left C6 foraminal stenosis. C6-C7: Shallow right paracentral disc protrusion indents the ventral thecal sac without contacting the ventral cord. No significant cord deformity. Foramina remain patent. C7-T1:  Mild facet hypertrophy.  No stenosis. Visualized upper thoracic spine within normal limits. IMPRESSION: MRI HEAD IMPRESSION: 1. Moderately advance cerebral white matter  changes involving the supratentorial cerebral white matter. Findings are nonspecific, and may be related to accelerated chronic small vessel ischemic disease. Possible demyelinating disease could also have this appearance, and could be considered in the correct clinical setting. No imaging findings to suggest active demyelination on this noncontrast examination. 2. Otherwise normal brain MRI. No other acute intracranial  abnormality identified. MRA HEAD IMPRESSION: 1. No acute abnormality within the cervical spine. Normal MRI appearance of the cervical spinal cord. 2. Moderate cervical spondylolysis at C4-5 and C5-6 with resultant moderate canal stenosis. Moderate bilateral C5 with severe right and moderate left C6 foraminal narrowing. 3. Mild disc bulge at C3-4 with resultant mild canal and bilateral C4 foraminal stenosis. Electronically Signed   By: Jeannine Boga M.D.   On: 08/13/2017 22:39    ____________________________________________   PROCEDURES  Procedure(s) performed:   Procedures  Critical Care performed:   ____________________________________________   INITIAL IMPRESSION / ASSESSMENT AND PLAN / ED COURSE  Discussed MRI with neurology.  Neurology recommends MRI head and neck with contrast but the patient in the hospital to have neurology see him in the morning.     Clinical Course as of Aug 14 2303  Wed Aug 13, 2017  2149 CBG monitoring, ED [PM]    Clinical Course User Index [PM] Nena Polio, MD     ____________________________________________   FINAL CLINICAL IMPRESSION(S) / ED DIAGNOSES  Final diagnoses:  Weakness  Actual diagnosis is left-sided weakness and pain that appears to be neuropathic   ED Discharge Orders    None       Note:  This document was prepared using Dragon voice recognition software and may include unintentional dictation errors.    Nena Polio, MD 08/13/17 [phone]

## 2017-08-13 NOTE — ED Notes (Signed)
Patient to MRI.

## 2017-08-14 ENCOUNTER — Observation Stay: Payer: Medicare Other

## 2017-08-14 DIAGNOSIS — G894 Chronic pain syndrome: Secondary | ICD-10-CM | POA: Diagnosis not present

## 2017-08-14 DIAGNOSIS — I10 Essential (primary) hypertension: Secondary | ICD-10-CM | POA: Diagnosis not present

## 2017-08-14 DIAGNOSIS — K219 Gastro-esophageal reflux disease without esophagitis: Secondary | ICD-10-CM | POA: Diagnosis not present

## 2017-08-14 DIAGNOSIS — R531 Weakness: Secondary | ICD-10-CM

## 2017-08-14 LAB — HEMOGLOBIN A1C
Hgb A1c MFr Bld: 6.5 % — ABNORMAL HIGH (ref 4.8–5.6)
Mean Plasma Glucose: 139.85 mg/dL

## 2017-08-14 LAB — LIPID PANEL
Cholesterol: 161 mg/dL (ref 0–200)
HDL: 23 mg/dL — ABNORMAL LOW (ref >40)
LDL Cholesterol: 86 mg/dL (ref 0–99)
Total CHOL/HDL Ratio: 7 RATIO
Triglycerides: 258 mg/dL — ABNORMAL HIGH (ref <150)
VLDL: 52 mg/dL — ABNORMAL HIGH (ref 0–40)

## 2017-08-14 MED ORDER — IRBESARTAN 150 MG PO TABS
300.0000 mg | ORAL_TABLET | Freq: Every day | ORAL | Status: DC
  Administered 2017-08-14: 300 mg via ORAL
  Filled 2017-08-14: qty 2

## 2017-08-14 MED ORDER — ACETAMINOPHEN 325 MG PO TABS: 650.0000 mg | ORAL_TABLET | ORAL | Status: DC | PRN

## 2017-08-14 MED ORDER — LABETALOL HCL 5 MG/ML IV SOLN
10.0000 mg | INTRAVENOUS | Status: DC | PRN
  Administered 2017-08-14: 10 mg via INTRAVENOUS
  Filled 2017-08-14: qty 4

## 2017-08-14 MED ORDER — BACLOFEN 10 MG PO TABS
10.0000 mg | ORAL_TABLET | Freq: Two times a day (BID) | ORAL | Status: DC
  Filled 2017-08-14 (×2): qty 1

## 2017-08-14 MED ORDER — AMLODIPINE BESYLATE-VALSARTAN 10-320 MG PO TABS: 1.0000 | ORAL_TABLET | Freq: Every day | ORAL | Status: DC

## 2017-08-14 MED ORDER — GADOBENATE DIMEGLUMINE 529 MG/ML IV SOLN
15.0000 mL | Freq: Once | INTRAVENOUS | Status: AC | PRN
  Administered 2017-08-14: 15 mL via INTRAVENOUS

## 2017-08-14 MED ORDER — MELOXICAM 7.5 MG PO TABS: 15.0000 mg | ORAL_TABLET | Freq: Every day | ORAL | Status: DC

## 2017-08-14 MED ORDER — ACETAMINOPHEN 650 MG RE SUPP: 650.0000 mg | RECTAL | Status: DC | PRN

## 2017-08-14 MED ORDER — ENOXAPARIN SODIUM 40 MG/0.4ML ~~LOC~~ SOLN
40.0000 mg | SUBCUTANEOUS | Status: DC
  Administered 2017-08-14: 40 mg via SUBCUTANEOUS
  Filled 2017-08-14: qty 0.4

## 2017-08-14 MED ORDER — AMLODIPINE BESYLATE 10 MG PO TABS
10.0000 mg | ORAL_TABLET | Freq: Every day | ORAL | Status: DC
  Administered 2017-08-14: 09:00:00 10 mg via ORAL
  Filled 2017-08-14: qty 1

## 2017-08-14 MED ORDER — METOPROLOL SUCCINATE ER 50 MG PO TB24
100.0000 mg | ORAL_TABLET | Freq: Every day | ORAL | Status: DC
  Administered 2017-08-14: 100 mg via ORAL
  Filled 2017-08-14: qty 2

## 2017-08-14 MED ORDER — ACETAMINOPHEN 160 MG/5ML PO SOLN
650.0000 mg | ORAL | Status: DC | PRN
  Filled 2017-08-14: qty 20.3

## 2017-08-14 MED ORDER — OXYCODONE HCL 5 MG PO TABS
10.0000 mg | ORAL_TABLET | Freq: Four times a day (QID) | ORAL | Status: DC | PRN
  Administered 2017-08-14 (×2): 10 mg via ORAL
  Filled 2017-08-14 (×2): qty 2

## 2017-08-14 MED ORDER — PANTOPRAZOLE SODIUM 40 MG PO TBEC
40.0000 mg | DELAYED_RELEASE_TABLET | Freq: Every day | ORAL | Status: DC
  Administered 2017-08-14: 40 mg via ORAL
  Filled 2017-08-14: qty 1

## 2017-08-14 MED ORDER — SUMATRIPTAN SUCCINATE 50 MG PO TABS
25.0000 mg | ORAL_TABLET | ORAL | Status: DC | PRN
  Administered 2017-08-14: 25 mg via ORAL
  Filled 2017-08-14: qty 1

## 2017-08-14 MED ORDER — ENOXAPARIN SODIUM 40 MG/0.4ML ~~LOC~~ SOLN: 40.0000 mg | SUBCUTANEOUS | Status: DC

## 2017-08-14 NOTE — Consult Note (Signed)
Reason for Consult:Left sided pain and weakness Referring Physician: Mody  CC:  Left sided pain and weakness  HPI: Kristopher [name] is an 57 y.o. male with a history of chronic pain followed in pain clinic who reports that for the past 4 days has had intermittent episodes when he will get severe pain in his left hip.  Due to pain patient then unable to move leg.  Gets tingling in the left leg and arm and will have a left sided headache with severity of 10/10.  When he gets the headache has blurred vision as well that resolves with resolution of the pain.    Past Medical History:  Diagnosis Date  . Allergy   . Arthritis    left foot  . Chronic back pain    Four rods in back  . Hypertension   . Migraines    daily    Past Surgical History:  Procedure Laterality Date  . APPENDECTOMY    . BACK SURGERY    . COLONOSCOPY WITH PROPOFOL N/A 02/19/2016   Procedure: COLONOSCOPY WITH PROPOFOL;  Surgeon: Lucilla Lame, MD;  Location: [facility];  Service: Endoscopy;  Laterality: N/A;  . FOOT SURGERY Left    Screws and plates  . KNEE SURGERY Left    X 2  . LEG SURGERY    . POLYPECTOMY N/A 02/19/2016   Procedure: POLYPECTOMY;  Surgeon: Lucilla Lame, MD;  Location: [facility];  Service: Endoscopy;  Laterality: N/A;    Family History  Problem Relation Age of Onset  . Cancer Father   . Diabetes Sister   . Thrombosis Sister     Social History:  reports that he has been smoking cigarettes.  He has a 26.25 pack-year smoking history. He has never used smokeless tobacco. He reports that he does not drink alcohol or use drugs.  Allergies  Allergen Reactions  . Lidocaine Anaphylaxis  . Penicillins Anaphylaxis  . Strawberry (Diagnostic) Anaphylaxis  . Aspirin     Feels like his throat is closing up  . Novocain [Procaine] Swelling    Medications:  I have reviewed the patient's current medications. Prior to Admission:  Medications Prior to Admission  Medication Sig Dispense  Refill Last Dose  . amLODipine-valsartan (EXFORGE) 10-320 MG tablet Take 1 tablet by mouth daily. 90 tablet 1 08/12/2017 at 1000  . baclofen (LIORESAL) 20 MG tablet Take 1 tablet (20 mg total) by mouth at bedtime as needed for muscle spasms. 30 each 3 prn at prn  . meloxicam (MOBIC) 15 MG tablet Take 1 tablet (15 mg total) by mouth daily. 30 tablet 3 08/12/2017 at 1000  . metoprolol succinate (TOPROL-XL) 100 MG 24 hr tablet Take 100 mg by mouth daily. Take with or immediately following a meal.   08/12/2017 at 2000  . omeprazole (PRILOSEC) 20 MG capsule Take 1 capsule (20 mg total) by mouth daily. 30 capsule 3 08/12/2017 at 1000  . Oxycodone HCl 10 MG TABS Take 1 tablet (10 mg total) by mouth every 6 (six) hours as needed. 120 tablet 0 08/13/2017 at 1500  . albuterol (PROVENTIL HFA;VENTOLIN HFA) 108 (90 Base) MCG/ACT inhaler Inhale 2 puffs into the lungs every 6 (six) hours as needed for wheezing or shortness of breath. (Patient not taking: Reported on 08/13/2017) 1 Inhaler 2 Not Taking at Unknown time  . doxazosin (CARDURA) 4 MG tablet Take 1 tablet (4 mg total) by mouth daily. (Patient not taking: Reported on 08/04/2017) 90 tablet 3 Not Taking at  Unknown time  . hydrochlorothiazide (HYDRODIURIL) 25 MG tablet Take 1 tablet (25 mg total) by mouth daily. (Patient not taking: Reported on 08/04/2017) 90 tablet 3 Not Taking at Unknown time  . SUMAtriptan (IMITREX) 100 MG tablet Take 1 tablet earliest onset of headache.  May repeat once after 2 hours if headache persists or recurs.  Do not exceed 2 tablets in 24 hrs 10 tablet 12 prn at prn   Scheduled: . amLODipine  10 mg Oral Daily   And  . irbesartan  300 mg Oral Daily  . [START ON 08/15/2017] enoxaparin (LOVENOX) injection  40 mg Subcutaneous Q24H  . metoprolol succinate  100 mg Oral Daily  . pantoprazole  40 mg Oral QAC breakfast    ROS: History obtained from the patient  General ROS: negative for - chills, fatigue, fever, night sweats, weight gain or  weight loss Psychological ROS: negative for - behavioral disorder, hallucinations, memory difficulties, mood swings or suicidal ideation Ophthalmic ROS: as noted in HPI ENT ROS: negative for - epistaxis, nasal discharge, oral lesions, sore throat, tinnitus or vertigo Allergy and Immunology ROS: negative for - hives or itchy/watery eyes Hematological and Lymphatic ROS: negative for - bleeding problems, bruising or swollen lymph nodes Endocrine ROS: negative for - galactorrhea, hair pattern changes, polydipsia/polyuria or temperature intolerance Respiratory ROS: negative for - cough, hemoptysis, shortness of breath or wheezing Cardiovascular ROS: negative for - chest pain, dyspnea on exertion, edema or irregular heartbeat Gastrointestinal ROS: negative for - abdominal pain, diarrhea, hematemesis, nausea/vomiting or stool incontinence Genito-Urinary ROS: negative for - dysuria, hematuria, incontinence or urinary frequency/urgency Musculoskeletal ROS: hip and back pain Neurological ROS: as noted in HPI Dermatological ROS: negative for rash and skin lesion changes  Physical Examination: Blood pressure 125/75, pulse 73, temperature 97.9 F (36.6 C), temperature source Oral, resp. rate 15, height 5\' 8"  (1.727 m), weight 104.3 kg (230 lb), SpO2 95 %.  HEENT-  Normocephalic, no lesions, without obvious abnormality.  Normal external eye and conjunctiva.  Normal TM's bilaterally.  Normal auditory canals and external ears. Normal external nose, mucus membranes and septum.  Normal pharynx. Cardiovascular- S1, S2 normal, pulses palpable throughout   Lungs- chest clear, no wheezing, rales, normal symmetric air entry Abdomen- soft, non-tender; bowel sounds normal; no masses,  no organomegaly Extremities- no edema Lymph-no adenopathy palpable Musculoskeletal-no joint tenderness, deformity or swelling Skin-warm and dry, no hyperpigmentation, vitiligo, or suspicious lesions  Neurological Examination    Mental Status: Alert, oriented, thought content appropriate.  Speech fluent without evidence of aphasia.  Able to follow 3 step commands without difficulty. Cranial Nerves: II: Discs flat bilaterally; Visual fields grossly normal, pupils equal, round, reactive to light and accommodation III,IV, VI: ptosis not present, extra-ocular motions intact bilaterally V,VII: smile symmetric, facial light touch sensation normal bilaterally VIII: hearing normal bilaterally IX,X: gag reflex present XI: bilateral shoulder shrug XII: midline tongue extension Motor: Right : Upper extremity   5/5    Left:     Upper extremity   5/5  Lower extremity   5/5      Due to pain unable to lift the left leg from the hip.  Able to bend at the knee although again limited by pain.  Plantar flexion and extension are intact.  No atrophy noted.   Sensory: Pinprick and light touch intact throughout, bilaterally Deep Tendon Reflexes: 2+ and symmetric with absent AJ's bilaterally Plantars: Right: mute   Left: mute Cerebellar: Normal finger-to-nose testing bilaterally Gait: not tested due to  pain    Laboratory Studies:   Basic Metabolic Panel: Recent Labs  Lab 08/13/17 1931  NA 138  K 3.3*  CL 102  CO2 25  GLUCOSE 142*  BUN 13  CREATININE 0.92  CALCIUM 9.4    Liver Function Tests: No results for input(s): AST, ALT, ALKPHOS, BILITOT, PROT, ALBUMIN in the last 168 hours. No results for input(s): LIPASE, AMYLASE in the last 168 hours. No results for input(s): AMMONIA in the last 168 hours.  CBC: Recent Labs  Lab 08/13/17 1931  WBC 7.8  HGB 14.2  HCT 40.3  MCV 87.7  PLT 173    Cardiac Enzymes: No results for input(s): CKTOTAL, CKMB, CKMBINDEX, TROPONINI in the last 168 hours.  BNP: Invalid input(s): POCBNP  CBG: No results for input(s): GLUCAP in the last 168 hours.  Microbiology: Results for orders placed or performed in visit on 06/24/16  Microscopic Examination     Status: Abnormal    Collection Time: 06/24/16  9:15 AM  Result Value Ref Range Status   WBC, UA 0-5 0 - 5 /hpf Final   RBC, UA 0-2 0 - 2 /hpf Final   Epithelial Cells (non renal) CANCELED      Comment: Test not performed  Result canceled by the ancillary    Crystals Present (A) N/A Final   Crystal Type Calcium Oxalate N/A Final   Mucus, UA Present (A) Not Estab. Final   Bacteria, UA None seen None seen/Few Final    Coagulation Studies: No results for input(s): LABPROT, INR in the last 72 hours.  Urinalysis:  Recent Labs  Lab 08/13/17 1931  COLORURINE YELLOW*  LABSPEC 1.023  PHURINE 7.0  GLUCOSEU NEGATIVE  HGBUR NEGATIVE  BILIRUBINUR NEGATIVE  KETONESUR NEGATIVE  PROTEINUR NEGATIVE  NITRITE NEGATIVE  LEUKOCYTESUR NEGATIVE    Lipid Panel:     Component Value Date/Time   CHOL 161 08/14/2017 0433   CHOL 178 06/24/2016 0916   TRIG 258 (H) 08/14/2017 0433   HDL 23 (L) 08/14/2017 0433   HDL 32 (L) 06/24/2016 0916   CHOLHDL 7.0 08/14/2017 0433   VLDL 52 (H) 08/14/2017 0433   LDLCALC 86 08/14/2017 0433   LDLCALC 108 (H) 06/24/2016 0916    HgbA1C:  Lab Results  Component Value Date   HGBA1C 6.5 (H) 08/14/2017    Urine Drug Screen:  No results found for: LABOPIA, COCAINSCRNUR, LABBENZ, AMPHETMU, THCU, LABBARB  Alcohol Level: No results for input(s): ETH in the last 168 hours.  Other results: EKG: normal sinus rhythm at 93 bpm.  Imaging: Dg Chest 2 View  Result Date: 08/13/2017 CLINICAL DATA:  57 year old male with sudden onset of left-sided chest pain and weakness 3 days ago. EXAM: CHEST - 2 VIEW COMPARISON:  No priors. FINDINGS: Lung volumes are normal. No consolidative airspace disease. No pleural effusions. No pneumothorax. No pulmonary nodule or mass noted. Pulmonary vasculature and the cardiomediastinal silhouette are within normal limits. IMPRESSION: No radiographic evidence of acute cardiopulmonary disease. Electronically Signed   By: Vinnie Langton M.D.   On: 08/13/2017  20:59   Ct Head Wo Contrast  Result Date: 08/13/2017 CLINICAL DATA:  Left arm and leg weakness for 3 days. EXAM: CT HEAD WITHOUT CONTRAST TECHNIQUE: Contiguous axial images were obtained from the base of the skull through the vertex without intravenous contrast. COMPARISON:  None. FINDINGS: Brain: No evidence of acute infarction, hemorrhage, hydrocephalus, extra-axial collection, or mass lesion/mass effect. Mild to moderate chronic subcortical white matter disease is seen bilaterally. Vascular:  No hyperdense vessel or other acute findings. Skull: No evidence of fracture or other significant bone abnormality. Sinuses/Orbits:  No acute findings. Other: None. IMPRESSION: No acute findings. Mild to moderate bilateral subcortical chronic white matter disease. Electronically Signed   By: Earle Gell M.D.   On: 08/13/2017 20:48   Mr Brain Wo Contrast  Result Date: 08/13/2017 CLINICAL DATA:  Initial evaluation for acute onset left-sided pain and weakness with associated blurry vision for 3 days. EXAM: MRI HEAD WITHOUT CONTRAST MRI CERVICAL SPINE WITHOUT CONTRAST TECHNIQUE: Multiplanar, multiecho pulse sequences of the brain and surrounding structures, and cervical spine, to include the craniocervical junction and cervicothoracic junction, were obtained without intravenous contrast. COMPARISON:  Prior CT from earlier the same day. FINDINGS: MRI HEAD FINDINGS Brain: Cerebral volume within normal limits for age. Patchy and confluent T2/FLAIR hyperintensity seen throughout the periventricular, deep, and subcortical white matter of both cerebral hemispheres, moderately advanced for age. Foci are nonspecific, and could reflect sequelae of chronic microvascular ischemic change. Possible demyelinating disease could also have this appearance. No infratentorial foci or brainstem involvement identified. No findings to suggest active demyelination on this noncontrast examination. No evidence for acute or subacute ischemia.  Gray-white matter differentiation maintained. No areas of remote cortical infarction. Small remote lacunar infarct noted at the left caudate head. No evidence for acute or chronic intracranial hemorrhage. No mass lesion, midline shift or mass effect. No hydrocephalus. No extra-axial fluid collection. Major dural sinuses grossly patent. Pituitary gland mildly prominent but within normal limits. Vascular: Major intracranial vascular flow voids maintained. Skull and upper cervical spine: Craniocervical junction normal. Bone marrow signal intensity within normal limits. No scalp soft tissue abnormality. Sinuses/Orbits: Globes and orbital soft tissues within normal limits. Paranasal sinuses are clear. No mastoid effusion. Inner ear structures normal. Other: None. MRI CERVICAL SPINE FINDINGS Alignment: Straightening with slight reversal of the normal cervical lordosis. Trace retrolisthesis of C4 on C5, likely chronic and facet mediated. Vertebrae: Vertebral body heights maintained without evidence for acute or chronic fracture. Bone marrow signal intensity within normal limits. No discrete or worrisome osseous lesions. No abnormal marrow edema. Cord: Signal intensity within the cervical spinal cord is within normal limits. No cord abnormality to suggest demyelinating disease. Posterior Fossa, vertebral arteries, paraspinal tissues: Craniocervical junction normal. Paraspinous and prevertebral soft tissues within normal limits. Normal intravascular flow voids present within the vertebral arteries bilaterally. Disc levels: C2-C3: Mild right-sided facet hypertrophy.  No stenosis. C3-C4: Broad left paracentral disc osteophyte indents the ventral thecal sac, abutting the ventral cord. Mild spinal stenosis without significant cord deformity. Mild bilateral C4 foraminal stenosis. C4-C5: Broad posterior disc osteophyte effaces the ventral thecal sac and flattens the cervical spinal cord. Moderate spinal stenosis. Superimposed  uncovertebral spurring with resultant moderate bilateral C5 foraminal stenosis, right worse than left. C5-C6: Chronic diffuse degenerative disc osteophyte with intervertebral disc space narrowing. Broad posterior component effaces the ventral thecal sac. Mild cord flattening without cord signal changes. Moderate spinal stenosis. Severe right with moderate left C6 foraminal stenosis. C6-C7: Shallow right paracentral disc protrusion indents the ventral thecal sac without contacting the ventral cord. No significant cord deformity. Foramina remain patent. C7-T1:  Mild facet hypertrophy.  No stenosis. Visualized upper thoracic spine within normal limits. IMPRESSION: MRI HEAD IMPRESSION: 1. Moderately advance cerebral white matter changes involving the supratentorial cerebral white matter. Findings are nonspecific, and may be related to accelerated chronic small vessel ischemic disease. Possible demyelinating disease could also have this appearance, and could be considered in the  correct clinical setting. No imaging findings to suggest active demyelination on this noncontrast examination. 2. Otherwise normal brain MRI. No other acute intracranial abnormality identified. MRA HEAD IMPRESSION: 1. No acute abnormality within the cervical spine. Normal MRI appearance of the cervical spinal cord. 2. Moderate cervical spondylolysis at C4-5 and C5-6 with resultant moderate canal stenosis. Moderate bilateral C5 with severe right and moderate left C6 foraminal narrowing. 3. Mild disc bulge at C3-4 with resultant mild canal and bilateral C4 foraminal stenosis. Electronically Signed   By: Jeannine Boga M.D.   On: 08/13/2017 22:39   Mr Brain W Contrast  Result Date: 08/14/2017 CLINICAL DATA:  Left arm weakness and left leg weakness. Left-sided pain. EXAM: MRI CERVICAL SPINE WITH CONTRAST MRI HEAD WITH CONTRAST TECHNIQUE: Multiplanar, multisequence MR imaging of the cervical spine was performed following the administration  of intravenous contrast. COMPARISON:  Noncontrast MRI of the brain and cervical spine 08/13/2017 FINDINGS: Postcontrast imaging of the brain and cervical spine show no abnormal contrast enhancement. Findings are otherwise unchanged from the earlier noncontrast examination. IMPRESSION: No abnormal contrast enhancement of the brain or cervical spine. Electronically Signed   By: Ulyses Jarred M.D.   On: 08/14/2017 00:42   Mr Cervical Spine Wo Contrast  Result Date: 08/13/2017 CLINICAL DATA:  Initial evaluation for acute onset left-sided pain and weakness with associated blurry vision for 3 days. EXAM: MRI HEAD WITHOUT CONTRAST MRI CERVICAL SPINE WITHOUT CONTRAST TECHNIQUE: Multiplanar, multiecho pulse sequences of the brain and surrounding structures, and cervical spine, to include the craniocervical junction and cervicothoracic junction, were obtained without intravenous contrast. COMPARISON:  Prior CT from earlier the same day. FINDINGS: MRI HEAD FINDINGS Brain: Cerebral volume within normal limits for age. Patchy and confluent T2/FLAIR hyperintensity seen throughout the periventricular, deep, and subcortical white matter of both cerebral hemispheres, moderately advanced for age. Foci are nonspecific, and could reflect sequelae of chronic microvascular ischemic change. Possible demyelinating disease could also have this appearance. No infratentorial foci or brainstem involvement identified. No findings to suggest active demyelination on this noncontrast examination. No evidence for acute or subacute ischemia. Gray-white matter differentiation maintained. No areas of remote cortical infarction. Small remote lacunar infarct noted at the left caudate head. No evidence for acute or chronic intracranial hemorrhage. No mass lesion, midline shift or mass effect. No hydrocephalus. No extra-axial fluid collection. Major dural sinuses grossly patent. Pituitary gland mildly prominent but within normal limits. Vascular:  Major intracranial vascular flow voids maintained. Skull and upper cervical spine: Craniocervical junction normal. Bone marrow signal intensity within normal limits. No scalp soft tissue abnormality. Sinuses/Orbits: Globes and orbital soft tissues within normal limits. Paranasal sinuses are clear. No mastoid effusion. Inner ear structures normal. Other: None. MRI CERVICAL SPINE FINDINGS Alignment: Straightening with slight reversal of the normal cervical lordosis. Trace retrolisthesis of C4 on C5, likely chronic and facet mediated. Vertebrae: Vertebral body heights maintained without evidence for acute or chronic fracture. Bone marrow signal intensity within normal limits. No discrete or worrisome osseous lesions. No abnormal marrow edema. Cord: Signal intensity within the cervical spinal cord is within normal limits. No cord abnormality to suggest demyelinating disease. Posterior Fossa, vertebral arteries, paraspinal tissues: Craniocervical junction normal. Paraspinous and prevertebral soft tissues within normal limits. Normal intravascular flow voids present within the vertebral arteries bilaterally. Disc levels: C2-C3: Mild right-sided facet hypertrophy.  No stenosis. C3-C4: Broad left paracentral disc osteophyte indents the ventral thecal sac, abutting the ventral cord. Mild spinal stenosis without significant cord deformity. Mild bilateral  C4 foraminal stenosis. C4-C5: Broad posterior disc osteophyte effaces the ventral thecal sac and flattens the cervical spinal cord. Moderate spinal stenosis. Superimposed uncovertebral spurring with resultant moderate bilateral C5 foraminal stenosis, right worse than left. C5-C6: Chronic diffuse degenerative disc osteophyte with intervertebral disc space narrowing. Broad posterior component effaces the ventral thecal sac. Mild cord flattening without cord signal changes. Moderate spinal stenosis. Severe right with moderate left C6 foraminal stenosis. C6-C7: Shallow right  paracentral disc protrusion indents the ventral thecal sac without contacting the ventral cord. No significant cord deformity. Foramina remain patent. C7-T1:  Mild facet hypertrophy.  No stenosis. Visualized upper thoracic spine within normal limits. IMPRESSION: MRI HEAD IMPRESSION: 1. Moderately advance cerebral white matter changes involving the supratentorial cerebral white matter. Findings are nonspecific, and may be related to accelerated chronic small vessel ischemic disease. Possible demyelinating disease could also have this appearance, and could be considered in the correct clinical setting. No imaging findings to suggest active demyelination on this noncontrast examination. 2. Otherwise normal brain MRI. No other acute intracranial abnormality identified. MRA HEAD IMPRESSION: 1. No acute abnormality within the cervical spine. Normal MRI appearance of the cervical spinal cord. 2. Moderate cervical spondylolysis at C4-5 and C5-6 with resultant moderate canal stenosis. Moderate bilateral C5 with severe right and moderate left C6 foraminal narrowing. 3. Mild disc bulge at C3-4 with resultant mild canal and bilateral C4 foraminal stenosis. Electronically Signed   By: Jeannine Boga M.D.   On: 08/13/2017 22:39   Mr Cervical Spine W Contrast  Result Date: 08/14/2017 CLINICAL DATA:  Left arm weakness and left leg weakness. Left-sided pain. EXAM: MRI CERVICAL SPINE WITH CONTRAST MRI HEAD WITH CONTRAST TECHNIQUE: Multiplanar, multisequence MR imaging of the cervical spine was performed following the administration of intravenous contrast. COMPARISON:  Noncontrast MRI of the brain and cervical spine 08/13/2017 FINDINGS: Postcontrast imaging of the brain and cervical spine show no abnormal contrast enhancement. Findings are otherwise unchanged from the earlier noncontrast examination. IMPRESSION: No abnormal contrast enhancement of the brain or cervical spine. Electronically Signed   By: Ulyses Jarred M.D.    On: 08/14/2017 00:42   Dg Hip Unilat With Pelvis 2-3 Views Left  Result Date: 08/14/2017 CLINICAL DATA:  Pain EXAM: DG HIP (WITH OR WITHOUT PELVIS) 2-3V LEFT COMPARISON:  June 06, 2015 FINDINGS: Frontal pelvis as well as frontal and lateral left hip images were obtained. No acute fracture or dislocation. There is osteoarthritic change in both hip joints, essentially stable from prior study. No erosive changes. There is postoperative change in the L5-S1 region. There are surgical clips in the right lower quadrant region. IMPRESSION: Moderate symmetric osteoarthritic change in both hip joints, stable from prior study. No fracture or dislocation. Electronically Signed   By: Lowella Grip III M.D.   On: 08/14/2017 10:45     Assessment/Plan: 57 year old male with a history of chronic pain from a work related injury who presents with a 4-day history of severe left hip pain.  MRI of the brain and cervical spine reviewed and show no acute etiologies to explain patient's symptoms.  MRI of the brain shows multiple T2 lesions with some concern for MS.  Patient refusing further work up, including LP, to investigate.  Hip fils only significant for degenerative changes.  Requesting medication for pain that would not violate his pain clinic agreement.    Recommendations: 1.  Ortho evaluation may be helpful  2.  Would start patient back on Mobic at 15mg  daily 3.  Would schedule baclofen 10mg  BID  Alexis Goodell, MD Neurology [phone] 08/14/2017, 12:07 PM

## 2017-08-14 NOTE — H&P (Signed)
[place] [facility] [place] NAME: Kristopher Renovato    MR#:  [id number]  DATE OF BIRTH:  June 10, 1960  DATE OF ADMISSION:  08/13/2017  PRIMARY CARE PHYSICIAN: [name], DO   REQUESTING/REFERRING PHYSICIAN: Owens Shark, MD  CHIEF COMPLAINT:   Chief Complaint  Patient presents with  . Weakness  . Left sided pain    HISTORY OF PRESENT ILLNESS:  Kristopher [name]  is a 57 y.o. male who presents with left-sided pain and subsequent weakness.  Patient states that this is been present for some time, though he came to the ED today as he found himself unable to walk and falling due to the same.  Here in the ED he had an MRI without contrast initially which speculated on some possible white matter lesions.  Neurology recommended admission and follow-up with MRI with contrast.  This was done and showed no significant abnormalities.  Hospitalist had been called for admission with neurology follow-up.  PAST MEDICAL HISTORY:   Past Medical History:  Diagnosis Date  . Allergy   . Arthritis    left foot  . Chronic back pain    Four rods in back  . Hypertension   . Migraines    daily     PAST SURGICAL HISTORY:   Past Surgical History:  Procedure Laterality Date  . APPENDECTOMY    . BACK SURGERY    . COLONOSCOPY WITH PROPOFOL N/A 02/19/2016   Procedure: COLONOSCOPY WITH PROPOFOL;  Surgeon: Lucilla Lame, MD;  Location: [facility];  Service: Endoscopy;  Laterality: N/A;  . FOOT SURGERY Left    Screws and plates  . KNEE SURGERY Left    X 2  . LEG SURGERY    . POLYPECTOMY N/A 02/19/2016   Procedure: POLYPECTOMY;  Surgeon: Lucilla Lame, MD;  Location: [facility];  Service: Endoscopy;  Laterality: N/A;     SOCIAL HISTORY:   Social History   Tobacco Use  . Smoking status: Current Every Day Smoker    Packs/day: 0.75    Years: 35.00    Pack years: 26.25    Types: Cigarettes  . Smokeless tobacco: Never Used  Substance Use  Topics  . Alcohol use: No    Alcohol/week: 0.0 oz     FAMILY HISTORY:   Family History  Problem Relation Age of Onset  . Cancer Father   . Diabetes Sister   . Thrombosis Sister      DRUG ALLERGIES:   Allergies  Allergen Reactions  . Lidocaine Anaphylaxis  . Penicillins Anaphylaxis  . Strawberry (Diagnostic) Anaphylaxis  . Aspirin     Feels like his throat is closing up  . Novocain [Procaine] Swelling    MEDICATIONS AT HOME:   Prior to Admission medications   Medication Sig Start Date End Date Taking? Authorizing Provider  amLODipine-valsartan (EXFORGE) 10-320 MG tablet Take 1 tablet by mouth daily. 06/24/16  Yes Johnson, [name], DO  baclofen (LIORESAL) 20 MG tablet Take 1 tablet (20 mg total) by mouth at bedtime as needed for muscle spasms. 06/03/17  Yes Johnson, [name], DO  meloxicam (MOBIC) 15 MG tablet Take 1 tablet (15 mg total) by mouth daily. 06/03/17  Yes Johnson, [name], DO  metoprolol succinate (TOPROL-XL) 100 MG 24 hr tablet Take 100 mg by mouth daily. Take with or immediately following a meal.   Yes [provider]  omeprazole (PRILOSEC) 20 MG capsule Take 1 capsule (20 mg total) by mouth daily.  06/24/16  Yes Johnson, [name], DO  Oxycodone HCl 10 MG TABS Take 1 tablet (10 mg total) by mouth every 6 (six) hours as needed. 08/01/17 08/31/17 Yes Vevelyn Francois, NP  albuterol (PROVENTIL HFA;VENTOLIN HFA) 108 (90 Base) MCG/ACT inhaler Inhale 2 puffs into the lungs every 6 (six) hours as needed for wheezing or shortness of breath. Patient not taking: Reported on 08/13/2017 12/02/16   Volney American, PA-C  doxazosin (CARDURA) 4 MG tablet Take 1 tablet (4 mg total) by mouth daily. Patient not taking: Reported on 08/04/2017 04/25/17   Minna Merritts, MD  hydrochlorothiazide (HYDRODIURIL) 25 MG tablet Take 1 tablet (25 mg total) by mouth daily. Patient not taking: Reported on 08/04/2017 03/31/17   Park Liter P, DO  SUMAtriptan (IMITREX) 100 MG tablet Take 1  tablet earliest onset of headache.  May repeat once after 2 hours if headache persists or recurs.  Do not exceed 2 tablets in 24 hrs 11/27/16   Park Liter P, DO    REVIEW OF SYSTEMS:  Review of Systems  Constitutional: Negative for chills, fever, malaise/fatigue and weight loss.  HENT: Negative for ear pain, hearing loss and tinnitus.   Eyes: Negative for blurred vision, double vision, pain and redness.  Respiratory: Negative for cough, hemoptysis and shortness of breath.   Cardiovascular: Negative for chest pain, palpitations, orthopnea and leg swelling.  Gastrointestinal: Negative for abdominal pain, constipation, diarrhea, nausea and vomiting.  Genitourinary: Negative for dysuria, frequency and hematuria.  Musculoskeletal: Negative for back pain, joint pain and neck pain.  Skin:       No acne, rash, or lesions  Neurological: Positive for focal weakness. Negative for dizziness, tremors and weakness.  Endo/Heme/Allergies: Negative for polydipsia. Does not bruise/bleed easily.  Psychiatric/Behavioral: Negative for depression. The patient is not nervous/anxious and does not have insomnia.      VITAL SIGNS:   Vitals:   08/13/17 2030 08/13/17 2050 08/13/17 2100 08/13/17 2217  BP: (!) 184/125 (!) 146/98 (!) 143/120 (!) 154/104  Pulse: 88  80 77  Resp: (!) 21  (!) 22 16  Temp:      TempSrc:      SpO2: 96%  95% 95%  Weight:      Height:       Wt Readings from Last 3 Encounters:  08/13/17 105.7 kg (233 lb)  08/04/17 105.8 kg (233 lb 4.8 oz)  07/21/17 97.5 kg (215 lb)    PHYSICAL EXAMINATION:  Physical Exam  Vitals reviewed. Constitutional: He is oriented to person, place, and time. He appears well-developed and well-nourished. No distress.  HENT:  Head: Normocephalic and atraumatic.  Mouth/Throat: Oropharynx is clear and moist.  Eyes: Pupils are equal, round, and reactive to light. Conjunctivae and EOM are normal. No scleral icterus.  Neck: Normal range of motion. Neck  supple. No JVD present. No thyromegaly present.  Cardiovascular: Normal rate, regular rhythm and intact distal pulses. Exam reveals no gallop and no friction rub.  No murmur heard. Respiratory: Effort normal and breath sounds normal. No respiratory distress. He has no wheezes. He has no rales.  GI: Soft. Bowel sounds are normal. He exhibits no distension. There is no tenderness.  Musculoskeletal: Normal range of motion. He exhibits no edema.  No arthritis, no gout  Lymphadenopathy:    He has no cervical adenopathy.  Neurological: He is alert and oriented to person, place, and time. No cranial nerve deficit.  Neurologic: Cranial nerves II-XII intact, Sensation intact to light touch/pinprick, 5/5  strength in right extremities, 4/5 strength in left extremities, no dysarthria, no aphasia, no dysphagia, memory intact  Skin: Skin is warm and dry. No rash noted. No erythema.  Psychiatric: He has a normal mood and affect. His behavior is normal. Judgment and thought content normal.    LABORATORY PANEL:   CBC Recent Labs  Lab 08/13/17 1931  WBC 7.8  HGB 14.2  HCT 40.3  PLT 173   ------------------------------------------------------------------------------------------------------------------  Chemistries  Recent Labs  Lab 08/13/17 1931  NA 138  K 3.3*  CL 102  CO2 25  GLUCOSE 142*  BUN 13  CREATININE 0.92  CALCIUM 9.4   ------------------------------------------------------------------------------------------------------------------  Cardiac Enzymes No results for input(s): TROPONINI in the last 168 hours. ------------------------------------------------------------------------------------------------------------------  RADIOLOGY:  Dg Chest 2 View  Result Date: 08/13/2017 CLINICAL DATA:  57 year old male with sudden onset of left-sided chest pain and weakness 3 days ago. EXAM: CHEST - 2 VIEW COMPARISON:  No priors. FINDINGS: Lung volumes are normal. No consolidative airspace  disease. No pleural effusions. No pneumothorax. No pulmonary nodule or mass noted. Pulmonary vasculature and the cardiomediastinal silhouette are within normal limits. IMPRESSION: No radiographic evidence of acute cardiopulmonary disease. Electronically Signed   By: Vinnie Langton M.D.   On: 08/13/2017 20:59   Ct Head Wo Contrast  Result Date: 08/13/2017 CLINICAL DATA:  Left arm and leg weakness for 3 days. EXAM: CT HEAD WITHOUT CONTRAST TECHNIQUE: Contiguous axial images were obtained from the base of the skull through the vertex without intravenous contrast. COMPARISON:  None. FINDINGS: Brain: No evidence of acute infarction, hemorrhage, hydrocephalus, extra-axial collection, or mass lesion/mass effect. Mild to moderate chronic subcortical white matter disease is seen bilaterally. Vascular:  No hyperdense vessel or other acute findings. Skull: No evidence of fracture or other significant bone abnormality. Sinuses/Orbits:  No acute findings. Other: None. IMPRESSION: No acute findings. Mild to moderate bilateral subcortical chronic white matter disease. Electronically Signed   By: Earle Gell M.D.   On: 08/13/2017 20:48   Mr Brain Wo Contrast  Result Date: 08/13/2017 CLINICAL DATA:  Initial evaluation for acute onset left-sided pain and weakness with associated blurry vision for 3 days. EXAM: MRI HEAD WITHOUT CONTRAST MRI CERVICAL SPINE WITHOUT CONTRAST TECHNIQUE: Multiplanar, multiecho pulse sequences of the brain and surrounding structures, and cervical spine, to include the craniocervical junction and cervicothoracic junction, were obtained without intravenous contrast. COMPARISON:  Prior CT from earlier the same day. FINDINGS: MRI HEAD FINDINGS Brain: Cerebral volume within normal limits for age. Patchy and confluent T2/FLAIR hyperintensity seen throughout the periventricular, deep, and subcortical white matter of both cerebral hemispheres, moderately advanced for age. Foci are nonspecific, and could  reflect sequelae of chronic microvascular ischemic change. Possible demyelinating disease could also have this appearance. No infratentorial foci or brainstem involvement identified. No findings to suggest active demyelination on this noncontrast examination. No evidence for acute or subacute ischemia. Gray-white matter differentiation maintained. No areas of remote cortical infarction. Small remote lacunar infarct noted at the left caudate head. No evidence for acute or chronic intracranial hemorrhage. No mass lesion, midline shift or mass effect. No hydrocephalus. No extra-axial fluid collection. Major dural sinuses grossly patent. Pituitary gland mildly prominent but within normal limits. Vascular: Major intracranial vascular flow voids maintained. Skull and upper cervical spine: Craniocervical junction normal. Bone marrow signal intensity within normal limits. No scalp soft tissue abnormality. Sinuses/Orbits: Globes and orbital soft tissues within normal limits. Paranasal sinuses are clear. No mastoid effusion. Inner ear structures normal. Other: None.  MRI CERVICAL SPINE FINDINGS Alignment: Straightening with slight reversal of the normal cervical lordosis. Trace retrolisthesis of C4 on C5, likely chronic and facet mediated. Vertebrae: Vertebral body heights maintained without evidence for acute or chronic fracture. Bone marrow signal intensity within normal limits. No discrete or worrisome osseous lesions. No abnormal marrow edema. Cord: Signal intensity within the cervical spinal cord is within normal limits. No cord abnormality to suggest demyelinating disease. Posterior Fossa, vertebral arteries, paraspinal tissues: Craniocervical junction normal. Paraspinous and prevertebral soft tissues within normal limits. Normal intravascular flow voids present within the vertebral arteries bilaterally. Disc levels: C2-C3: Mild right-sided facet hypertrophy.  No stenosis. C3-C4: Broad left paracentral disc osteophyte  indents the ventral thecal sac, abutting the ventral cord. Mild spinal stenosis without significant cord deformity. Mild bilateral C4 foraminal stenosis. C4-C5: Broad posterior disc osteophyte effaces the ventral thecal sac and flattens the cervical spinal cord. Moderate spinal stenosis. Superimposed uncovertebral spurring with resultant moderate bilateral C5 foraminal stenosis, right worse than left. C5-C6: Chronic diffuse degenerative disc osteophyte with intervertebral disc space narrowing. Broad posterior component effaces the ventral thecal sac. Mild cord flattening without cord signal changes. Moderate spinal stenosis. Severe right with moderate left C6 foraminal stenosis. C6-C7: Shallow right paracentral disc protrusion indents the ventral thecal sac without contacting the ventral cord. No significant cord deformity. Foramina remain patent. C7-T1:  Mild facet hypertrophy.  No stenosis. Visualized upper thoracic spine within normal limits. IMPRESSION: MRI HEAD IMPRESSION: 1. Moderately advance cerebral white matter changes involving the supratentorial cerebral white matter. Findings are nonspecific, and may be related to accelerated chronic small vessel ischemic disease. Possible demyelinating disease could also have this appearance, and could be considered in the correct clinical setting. No imaging findings to suggest active demyelination on this noncontrast examination. 2. Otherwise normal brain MRI. No other acute intracranial abnormality identified. MRA HEAD IMPRESSION: 1. No acute abnormality within the cervical spine. Normal MRI appearance of the cervical spinal cord. 2. Moderate cervical spondylolysis at C4-5 and C5-6 with resultant moderate canal stenosis. Moderate bilateral C5 with severe right and moderate left C6 foraminal narrowing. 3. Mild disc bulge at C3-4 with resultant mild canal and bilateral C4 foraminal stenosis. Electronically Signed   By: Jeannine Boga M.D.   On: 08/13/2017 22:39    Mr Cervical Spine Wo Contrast  Result Date: 08/13/2017 CLINICAL DATA:  Initial evaluation for acute onset left-sided pain and weakness with associated blurry vision for 3 days. EXAM: MRI HEAD WITHOUT CONTRAST MRI CERVICAL SPINE WITHOUT CONTRAST TECHNIQUE: Multiplanar, multiecho pulse sequences of the brain and surrounding structures, and cervical spine, to include the craniocervical junction and cervicothoracic junction, were obtained without intravenous contrast. COMPARISON:  Prior CT from earlier the same day. FINDINGS: MRI HEAD FINDINGS Brain: Cerebral volume within normal limits for age. Patchy and confluent T2/FLAIR hyperintensity seen throughout the periventricular, deep, and subcortical white matter of both cerebral hemispheres, moderately advanced for age. Foci are nonspecific, and could reflect sequelae of chronic microvascular ischemic change. Possible demyelinating disease could also have this appearance. No infratentorial foci or brainstem involvement identified. No findings to suggest active demyelination on this noncontrast examination. No evidence for acute or subacute ischemia. Gray-white matter differentiation maintained. No areas of remote cortical infarction. Small remote lacunar infarct noted at the left caudate head. No evidence for acute or chronic intracranial hemorrhage. No mass lesion, midline shift or mass effect. No hydrocephalus. No extra-axial fluid collection. Major dural sinuses grossly patent. Pituitary gland mildly prominent but within normal limits. Vascular:  Major intracranial vascular flow voids maintained. Skull and upper cervical spine: Craniocervical junction normal. Bone marrow signal intensity within normal limits. No scalp soft tissue abnormality. Sinuses/Orbits: Globes and orbital soft tissues within normal limits. Paranasal sinuses are clear. No mastoid effusion. Inner ear structures normal. Other: None. MRI CERVICAL SPINE FINDINGS Alignment: Straightening with  slight reversal of the normal cervical lordosis. Trace retrolisthesis of C4 on C5, likely chronic and facet mediated. Vertebrae: Vertebral body heights maintained without evidence for acute or chronic fracture. Bone marrow signal intensity within normal limits. No discrete or worrisome osseous lesions. No abnormal marrow edema. Cord: Signal intensity within the cervical spinal cord is within normal limits. No cord abnormality to suggest demyelinating disease. Posterior Fossa, vertebral arteries, paraspinal tissues: Craniocervical junction normal. Paraspinous and prevertebral soft tissues within normal limits. Normal intravascular flow voids present within the vertebral arteries bilaterally. Disc levels: C2-C3: Mild right-sided facet hypertrophy.  No stenosis. C3-C4: Broad left paracentral disc osteophyte indents the ventral thecal sac, abutting the ventral cord. Mild spinal stenosis without significant cord deformity. Mild bilateral C4 foraminal stenosis. C4-C5: Broad posterior disc osteophyte effaces the ventral thecal sac and flattens the cervical spinal cord. Moderate spinal stenosis. Superimposed uncovertebral spurring with resultant moderate bilateral C5 foraminal stenosis, right worse than left. C5-C6: Chronic diffuse degenerative disc osteophyte with intervertebral disc space narrowing. Broad posterior component effaces the ventral thecal sac. Mild cord flattening without cord signal changes. Moderate spinal stenosis. Severe right with moderate left C6 foraminal stenosis. C6-C7: Shallow right paracentral disc protrusion indents the ventral thecal sac without contacting the ventral cord. No significant cord deformity. Foramina remain patent. C7-T1:  Mild facet hypertrophy.  No stenosis. Visualized upper thoracic spine within normal limits. IMPRESSION: MRI HEAD IMPRESSION: 1. Moderately advance cerebral white matter changes involving the supratentorial cerebral white matter. Findings are nonspecific, and may be  related to accelerated chronic small vessel ischemic disease. Possible demyelinating disease could also have this appearance, and could be considered in the correct clinical setting. No imaging findings to suggest active demyelination on this noncontrast examination. 2. Otherwise normal brain MRI. No other acute intracranial abnormality identified. MRA HEAD IMPRESSION: 1. No acute abnormality within the cervical spine. Normal MRI appearance of the cervical spinal cord. 2. Moderate cervical spondylolysis at C4-5 and C5-6 with resultant moderate canal stenosis. Moderate bilateral C5 with severe right and moderate left C6 foraminal narrowing. 3. Mild disc bulge at C3-4 with resultant mild canal and bilateral C4 foraminal stenosis. Electronically Signed   By: Jeannine Boga M.D.   On: 08/13/2017 22:39    EKG:   Orders placed or performed during the hospital encounter of 08/13/17  . EKG 12-Lead  . EKG 12-Lead  . ED EKG  . ED EKG    IMPRESSION AND PLAN:  Principal Problem:   Left-sided weakness -MRI imaging work-up already done and showed no stroke, white matter lesion seen on initial MRI without contrast was not commented on an MRI with contrast.  We will get a neurology consult for further recommendation Active Problems:   Chronic pain syndrome -home dose analgesia   HTN (hypertension) -continue home meds   GERD (gastroesophageal reflux disease) -treat PRN as patient is not on chronic medication for this  Chart review performed and case discussed with ED provider. Labs, imaging and/or ECG reviewed by provider and discussed with patient/family. Management plans discussed with the patient and/or family.  DVT PROPHYLAXIS: SubQ lovenox  GI PROPHYLAXIS: None  ADMISSION STATUS: Observation  CODE STATUS: Full  TOTAL TIME TAKING CARE OF THIS PATIENT: 40 minutes.   [place], Clayton Jarmon [place] 08/14/2017, 12:00 AM  [facility]  Office  [phone]  CC: Primary care  physician; [name], DO  Note:  This document was prepared using Dragon voice recognition software and may include unintentional dictation errors.

## 2017-08-14 NOTE — Discharge Summary (Signed)
[facility] [place] NAME: Kristopher [name]    MR#:  [id number]  DATE OF BIRTH:  November 17, 1960  DATE OF ADMISSION:  08/13/2017 ADMITTING PHYSICIAN: Lance Coon, MD  DATE OF DISCHARGE: 08/14/2017  PRIMARY CARE PHYSICIAN: [name], DO    ADMISSION DIAGNOSIS:  Weakness [R53.1]  DISCHARGE DIAGNOSIS:  Principal Problem:   Left-sided weakness Active Problems:   Chronic pain syndrome   GERD (gastroesophageal reflux disease)   HTN (hypertension)   SECONDARY DIAGNOSIS:   Past Medical History:  Diagnosis Date  . Allergy   . Arthritis    left foot  . Chronic back pain    Four rods in back  . Hypertension   . Migraines    daily    HOSPITAL COURSE:  57 year old male with tobacco dependence and chronic pain followed in pain clinic who reports for the past 4 days he has had episodes of severe pain in his left hip.  1.  Left hip pain: This is due to severe arthritis.  Patient has been seen by pain clinician and advised for steroids however he is allergic to Novocain. Patient did not want to wait for orthopedic surgery consultation as recommended by neurology. He will have outpatient follow-up with Dr. Marry Guan.  2.  Tingling in the left leg and left arm: Patient had MRI of the brain and cervical spine with and without contrast which essentially shows arthritis but no acute stroke.  3.  Chronic pain syndrome: Patient is referred back to his pain clinician  4. Tobacco dependence: Patient is encouraged to quit smoking. Counseling was provided for 4 minutes.   DISCHARGE CONDITIONS AND DIET:   Stable for discharge on regular diet  CONSULTS OBTAINED:  Treatment Team:  Alexis Goodell, MD Marry Guan, Laurice Record, MD  DRUG ALLERGIES:   Allergies  Allergen Reactions  . Lidocaine Anaphylaxis  . Penicillins Anaphylaxis  . Strawberry (Diagnostic) Anaphylaxis  . Aspirin     Feels like his throat is closing up  . Novocain [Procaine]  Swelling    DISCHARGE MEDICATIONS:   Allergies as of 08/14/2017      Reactions   Lidocaine Anaphylaxis   Penicillins Anaphylaxis   Strawberry (diagnostic) Anaphylaxis   Aspirin    Feels like his throat is closing up   Novocain [procaine] Swelling      Medication List    TAKE these medications   albuterol 108 (90 Base) MCG/ACT inhaler Commonly known as:  PROVENTIL HFA;VENTOLIN HFA Inhale 2 puffs into the lungs every 6 (six) hours as needed for wheezing or shortness of breath.   amLODipine-valsartan 10-320 MG tablet Commonly known as:  EXFORGE Take 1 tablet by mouth daily.   baclofen 20 MG tablet Commonly known as:  LIORESAL Take 1 tablet (20 mg total) by mouth at bedtime as needed for muscle spasms.   doxazosin 4 MG tablet Commonly known as:  CARDURA Take 1 tablet (4 mg total) by mouth daily.   hydrochlorothiazide 25 MG tablet Commonly known as:  HYDRODIURIL Take 1 tablet (25 mg total) by mouth daily.   meloxicam 15 MG tablet Commonly known as:  MOBIC Take 1 tablet (15 mg total) by mouth daily.   metoprolol succinate 100 MG 24 hr tablet Commonly known as:  TOPROL-XL Take 100 mg by mouth daily. Take with or immediately following a meal.   omeprazole 20 MG capsule Commonly known as:  PRILOSEC Take 1 capsule (20 mg total) by mouth daily.   Oxycodone HCl  10 MG Tabs Take 1 tablet (10 mg total) by mouth every 6 (six) hours as needed.   SUMAtriptan 100 MG tablet Commonly known as:  IMITREX Take 1 tablet earliest onset of headache.  May repeat once after 2 hours if headache persists or recurs.  Do not exceed 2 tablets in 24 hrs         Today   CHIEF COMPLAINT:  Wants to go home left pain hip the same but does not want to wait for ORTHO evaluation   VITAL SIGNS:  Blood pressure 125/75, pulse 73, temperature 97.9 F (36.6 C), temperature source Oral, resp. rate 15, height 5\' 8"  (1.727 m), weight 104.3 kg (230 lb), SpO2 95 %.   REVIEW OF SYSTEMS:  Review  of Systems  Constitutional: Negative.  Negative for chills, fever and malaise/fatigue.  HENT: Negative.  Negative for ear discharge, ear pain, hearing loss, nosebleeds and sore throat.   Eyes: Negative.  Negative for blurred vision and pain.  Respiratory: Negative.  Negative for cough, hemoptysis, shortness of breath and wheezing.   Cardiovascular: Negative.  Negative for chest pain, palpitations and leg swelling.  Gastrointestinal: Negative.  Negative for abdominal pain, blood in stool, diarrhea, nausea and vomiting.  Genitourinary: Negative.  Negative for dysuria.  Musculoskeletal: Positive for joint pain.  Skin: Negative.   Neurological: Negative for dizziness, tremors, speech change, focal weakness, seizures and headaches.  Endo/Heme/Allergies: Negative.  Does not bruise/bleed easily.  Psychiatric/Behavioral: Negative.  Negative for depression, hallucinations and suicidal ideas.     PHYSICAL EXAMINATION:  GENERAL:  57 y.o.-year-old patient lying in the bed with no acute distress.  NECK:  Supple, no jugular venous distention. No thyroid enlargement, no tenderness.  LUNGS: Normal breath sounds bilaterally, no wheezing, rales,rhonchi  No use of accessory muscles of respiration.  CARDIOVASCULAR: S1, S2 normal. No murmurs, rubs, or gallops.  ABDOMEN: Soft, non-tender, non-distended. Bowel sounds present. No organomegaly or mass.  EXTREMITIES: No pedal edema, cyanosis, or clubbing.  Due to pain unable to lift the left leg from the hip.  Able to bend at the knee although again limited by pain.    PSYCHIATRIC: The patient is alert and oriented x 3.  SKIN: No obvious rash, lesion, or ulcer.   DATA REVIEW:   CBC Recent Labs  Lab 08/13/17 1931  WBC 7.8  HGB 14.2  HCT 40.3  PLT 173    Chemistries  Recent Labs  Lab 08/13/17 1931  NA 138  K 3.3*  CL 102  CO2 25  GLUCOSE 142*  BUN 13  CREATININE 0.92  CALCIUM 9.4    Cardiac Enzymes No results for input(s): TROPONINI in  the last 168 hours.  Microbiology Results  @[id number] @  RADIOLOGY:  Dg Chest 2 View  Result Date: 08/13/2017 CLINICAL DATA:  57 year old male with sudden onset of left-sided chest pain and weakness 3 days ago. EXAM: CHEST - 2 VIEW COMPARISON:  No priors. FINDINGS: Lung volumes are normal. No consolidative airspace disease. No pleural effusions. No pneumothorax. No pulmonary nodule or mass noted. Pulmonary vasculature and the cardiomediastinal silhouette are within normal limits. IMPRESSION: No radiographic evidence of acute cardiopulmonary disease. Electronically Signed   By: Vinnie Langton M.D.   On: 08/13/2017 20:59   Ct Head Wo Contrast  Result Date: 08/13/2017 CLINICAL DATA:  Left arm and leg weakness for 3 days. EXAM: CT HEAD WITHOUT CONTRAST TECHNIQUE: Contiguous axial images were obtained from the base of the skull through the vertex without intravenous contrast. COMPARISON:  None. FINDINGS: Brain: No evidence of acute infarction, hemorrhage, hydrocephalus, extra-axial collection, or mass lesion/mass effect. Mild to moderate chronic subcortical white matter disease is seen bilaterally. Vascular:  No hyperdense vessel or other acute findings. Skull: No evidence of fracture or other significant bone abnormality. Sinuses/Orbits:  No acute findings. Other: None. IMPRESSION: No acute findings. Mild to moderate bilateral subcortical chronic white matter disease. Electronically Signed   By: Earle Gell M.D.   On: 08/13/2017 20:48   Mr Brain Wo Contrast  Result Date: 08/13/2017 CLINICAL DATA:  Initial evaluation for acute onset left-sided pain and weakness with associated blurry vision for 3 days. EXAM: MRI HEAD WITHOUT CONTRAST MRI CERVICAL SPINE WITHOUT CONTRAST TECHNIQUE: Multiplanar, multiecho pulse sequences of the brain and surrounding structures, and cervical spine, to include the craniocervical junction and cervicothoracic junction, were obtained without intravenous contrast. COMPARISON:   Prior CT from earlier the same day. FINDINGS: MRI HEAD FINDINGS Brain: Cerebral volume within normal limits for age. Patchy and confluent T2/FLAIR hyperintensity seen throughout the periventricular, deep, and subcortical white matter of both cerebral hemispheres, moderately advanced for age. Foci are nonspecific, and could reflect sequelae of chronic microvascular ischemic change. Possible demyelinating disease could also have this appearance. No infratentorial foci or brainstem involvement identified. No findings to suggest active demyelination on this noncontrast examination. No evidence for acute or subacute ischemia. Gray-white matter differentiation maintained. No areas of remote cortical infarction. Small remote lacunar infarct noted at the left caudate head. No evidence for acute or chronic intracranial hemorrhage. No mass lesion, midline shift or mass effect. No hydrocephalus. No extra-axial fluid collection. Major dural sinuses grossly patent. Pituitary gland mildly prominent but within normal limits. Vascular: Major intracranial vascular flow voids maintained. Skull and upper cervical spine: Craniocervical junction normal. Bone marrow signal intensity within normal limits. No scalp soft tissue abnormality. Sinuses/Orbits: Globes and orbital soft tissues within normal limits. Paranasal sinuses are clear. No mastoid effusion. Inner ear structures normal. Other: None. MRI CERVICAL SPINE FINDINGS Alignment: Straightening with slight reversal of the normal cervical lordosis. Trace retrolisthesis of C4 on C5, likely chronic and facet mediated. Vertebrae: Vertebral body heights maintained without evidence for acute or chronic fracture. Bone marrow signal intensity within normal limits. No discrete or worrisome osseous lesions. No abnormal marrow edema. Cord: Signal intensity within the cervical spinal cord is within normal limits. No cord abnormality to suggest demyelinating disease. Posterior Fossa, vertebral  arteries, paraspinal tissues: Craniocervical junction normal. Paraspinous and prevertebral soft tissues within normal limits. Normal intravascular flow voids present within the vertebral arteries bilaterally. Disc levels: C2-C3: Mild right-sided facet hypertrophy.  No stenosis. C3-C4: Broad left paracentral disc osteophyte indents the ventral thecal sac, abutting the ventral cord. Mild spinal stenosis without significant cord deformity. Mild bilateral C4 foraminal stenosis. C4-C5: Broad posterior disc osteophyte effaces the ventral thecal sac and flattens the cervical spinal cord. Moderate spinal stenosis. Superimposed uncovertebral spurring with resultant moderate bilateral C5 foraminal stenosis, right worse than left. C5-C6: Chronic diffuse degenerative disc osteophyte with intervertebral disc space narrowing. Broad posterior component effaces the ventral thecal sac. Mild cord flattening without cord signal changes. Moderate spinal stenosis. Severe right with moderate left C6 foraminal stenosis. C6-C7: Shallow right paracentral disc protrusion indents the ventral thecal sac without contacting the ventral cord. No significant cord deformity. Foramina remain patent. C7-T1:  Mild facet hypertrophy.  No stenosis. Visualized upper thoracic spine within normal limits. IMPRESSION: MRI HEAD IMPRESSION: 1. Moderately advance cerebral white matter changes involving the supratentorial cerebral white  matter. Findings are nonspecific, and may be related to accelerated chronic small vessel ischemic disease. Possible demyelinating disease could also have this appearance, and could be considered in the correct clinical setting. No imaging findings to suggest active demyelination on this noncontrast examination. 2. Otherwise normal brain MRI. No other acute intracranial abnormality identified. MRA HEAD IMPRESSION: 1. No acute abnormality within the cervical spine. Normal MRI appearance of the cervical spinal cord. 2. Moderate  cervical spondylolysis at C4-5 and C5-6 with resultant moderate canal stenosis. Moderate bilateral C5 with severe right and moderate left C6 foraminal narrowing. 3. Mild disc bulge at C3-4 with resultant mild canal and bilateral C4 foraminal stenosis. Electronically Signed   By: Jeannine Boga M.D.   On: 08/13/2017 22:39   Mr Brain W Contrast  Result Date: 08/14/2017 CLINICAL DATA:  Left arm weakness and left leg weakness. Left-sided pain. EXAM: MRI CERVICAL SPINE WITH CONTRAST MRI HEAD WITH CONTRAST TECHNIQUE: Multiplanar, multisequence MR imaging of the cervical spine was performed following the administration of intravenous contrast. COMPARISON:  Noncontrast MRI of the brain and cervical spine 08/13/2017 FINDINGS: Postcontrast imaging of the brain and cervical spine show no abnormal contrast enhancement. Findings are otherwise unchanged from the earlier noncontrast examination. IMPRESSION: No abnormal contrast enhancement of the brain or cervical spine. Electronically Signed   By: Ulyses Jarred M.D.   On: 08/14/2017 00:42   Mr Cervical Spine Wo Contrast  Result Date: 08/13/2017 CLINICAL DATA:  Initial evaluation for acute onset left-sided pain and weakness with associated blurry vision for 3 days. EXAM: MRI HEAD WITHOUT CONTRAST MRI CERVICAL SPINE WITHOUT CONTRAST TECHNIQUE: Multiplanar, multiecho pulse sequences of the brain and surrounding structures, and cervical spine, to include the craniocervical junction and cervicothoracic junction, were obtained without intravenous contrast. COMPARISON:  Prior CT from earlier the same day. FINDINGS: MRI HEAD FINDINGS Brain: Cerebral volume within normal limits for age. Patchy and confluent T2/FLAIR hyperintensity seen throughout the periventricular, deep, and subcortical white matter of both cerebral hemispheres, moderately advanced for age. Foci are nonspecific, and could reflect sequelae of chronic microvascular ischemic change. Possible demyelinating  disease could also have this appearance. No infratentorial foci or brainstem involvement identified. No findings to suggest active demyelination on this noncontrast examination. No evidence for acute or subacute ischemia. Gray-white matter differentiation maintained. No areas of remote cortical infarction. Small remote lacunar infarct noted at the left caudate head. No evidence for acute or chronic intracranial hemorrhage. No mass lesion, midline shift or mass effect. No hydrocephalus. No extra-axial fluid collection. Major dural sinuses grossly patent. Pituitary gland mildly prominent but within normal limits. Vascular: Major intracranial vascular flow voids maintained. Skull and upper cervical spine: Craniocervical junction normal. Bone marrow signal intensity within normal limits. No scalp soft tissue abnormality. Sinuses/Orbits: Globes and orbital soft tissues within normal limits. Paranasal sinuses are clear. No mastoid effusion. Inner ear structures normal. Other: None. MRI CERVICAL SPINE FINDINGS Alignment: Straightening with slight reversal of the normal cervical lordosis. Trace retrolisthesis of C4 on C5, likely chronic and facet mediated. Vertebrae: Vertebral body heights maintained without evidence for acute or chronic fracture. Bone marrow signal intensity within normal limits. No discrete or worrisome osseous lesions. No abnormal marrow edema. Cord: Signal intensity within the cervical spinal cord is within normal limits. No cord abnormality to suggest demyelinating disease. Posterior Fossa, vertebral arteries, paraspinal tissues: Craniocervical junction normal. Paraspinous and prevertebral soft tissues within normal limits. Normal intravascular flow voids present within the vertebral arteries bilaterally. Disc levels: C2-C3: Mild right-sided  facet hypertrophy.  No stenosis. C3-C4: Broad left paracentral disc osteophyte indents the ventral thecal sac, abutting the ventral cord. Mild spinal stenosis  without significant cord deformity. Mild bilateral C4 foraminal stenosis. C4-C5: Broad posterior disc osteophyte effaces the ventral thecal sac and flattens the cervical spinal cord. Moderate spinal stenosis. Superimposed uncovertebral spurring with resultant moderate bilateral C5 foraminal stenosis, right worse than left. C5-C6: Chronic diffuse degenerative disc osteophyte with intervertebral disc space narrowing. Broad posterior component effaces the ventral thecal sac. Mild cord flattening without cord signal changes. Moderate spinal stenosis. Severe right with moderate left C6 foraminal stenosis. C6-C7: Shallow right paracentral disc protrusion indents the ventral thecal sac without contacting the ventral cord. No significant cord deformity. Foramina remain patent. C7-T1:  Mild facet hypertrophy.  No stenosis. Visualized upper thoracic spine within normal limits. IMPRESSION: MRI HEAD IMPRESSION: 1. Moderately advance cerebral white matter changes involving the supratentorial cerebral white matter. Findings are nonspecific, and may be related to accelerated chronic small vessel ischemic disease. Possible demyelinating disease could also have this appearance, and could be considered in the correct clinical setting. No imaging findings to suggest active demyelination on this noncontrast examination. 2. Otherwise normal brain MRI. No other acute intracranial abnormality identified. MRA HEAD IMPRESSION: 1. No acute abnormality within the cervical spine. Normal MRI appearance of the cervical spinal cord. 2. Moderate cervical spondylolysis at C4-5 and C5-6 with resultant moderate canal stenosis. Moderate bilateral C5 with severe right and moderate left C6 foraminal narrowing. 3. Mild disc bulge at C3-4 with resultant mild canal and bilateral C4 foraminal stenosis. Electronically Signed   By: Jeannine Boga M.D.   On: 08/13/2017 22:39   Mr Cervical Spine W Contrast  Result Date: 08/14/2017 CLINICAL DATA:  Left  arm weakness and left leg weakness. Left-sided pain. EXAM: MRI CERVICAL SPINE WITH CONTRAST MRI HEAD WITH CONTRAST TECHNIQUE: Multiplanar, multisequence MR imaging of the cervical spine was performed following the administration of intravenous contrast. COMPARISON:  Noncontrast MRI of the brain and cervical spine 08/13/2017 FINDINGS: Postcontrast imaging of the brain and cervical spine show no abnormal contrast enhancement. Findings are otherwise unchanged from the earlier noncontrast examination. IMPRESSION: No abnormal contrast enhancement of the brain or cervical spine. Electronically Signed   By: Ulyses Jarred M.D.   On: 08/14/2017 00:42   Dg Hip Unilat With Pelvis 2-3 Views Left  Result Date: 08/14/2017 CLINICAL DATA:  Pain EXAM: DG HIP (WITH OR WITHOUT PELVIS) 2-3V LEFT COMPARISON:  June 06, 2015 FINDINGS: Frontal pelvis as well as frontal and lateral left hip images were obtained. No acute fracture or dislocation. There is osteoarthritic change in both hip joints, essentially stable from prior study. No erosive changes. There is postoperative change in the L5-S1 region. There are surgical clips in the right lower quadrant region. IMPRESSION: Moderate symmetric osteoarthritic change in both hip joints, stable from prior study. No fracture or dislocation. Electronically Signed   By: Lowella Grip III M.D.   On: 08/14/2017 10:45      Allergies as of 08/14/2017      Reactions   Lidocaine Anaphylaxis   Penicillins Anaphylaxis   Strawberry (diagnostic) Anaphylaxis   Aspirin    Feels like his throat is closing up   Novocain [procaine] Swelling      Medication List    TAKE these medications   albuterol 108 (90 Base) MCG/ACT inhaler Commonly known as:  PROVENTIL HFA;VENTOLIN HFA Inhale 2 puffs into the lungs every 6 (six) hours as needed for wheezing or  shortness of breath.   amLODipine-valsartan 10-320 MG tablet Commonly known as:  EXFORGE Take 1 tablet by mouth daily.   baclofen 20  MG tablet Commonly known as:  LIORESAL Take 1 tablet (20 mg total) by mouth at bedtime as needed for muscle spasms.   doxazosin 4 MG tablet Commonly known as:  CARDURA Take 1 tablet (4 mg total) by mouth daily.   hydrochlorothiazide 25 MG tablet Commonly known as:  HYDRODIURIL Take 1 tablet (25 mg total) by mouth daily.   meloxicam 15 MG tablet Commonly known as:  MOBIC Take 1 tablet (15 mg total) by mouth daily.   metoprolol succinate 100 MG 24 hr tablet Commonly known as:  TOPROL-XL Take 100 mg by mouth daily. Take with or immediately following a meal.   omeprazole 20 MG capsule Commonly known as:  PRILOSEC Take 1 capsule (20 mg total) by mouth daily.   Oxycodone HCl 10 MG Tabs Take 1 tablet (10 mg total) by mouth every 6 (six) hours as needed.   SUMAtriptan 100 MG tablet Commonly known as:  IMITREX Take 1 tablet earliest onset of headache.  May repeat once after 2 hours if headache persists or recurs.  Do not exceed 2 tablets in 24 hrs          Management plans discussed with the patient and he is in agreement. Stable for discharge home  Patient should follow up with pcp  CODE STATUS:     Code Status Orders  (From admission, onward)        Start     Ordered   08/14/17 0100  Full code  Continuous     08/14/17 0059    Code Status History    This patient has a current code status but no historical code status.      TOTAL TIME TAKING CARE OF THIS PATIENT: 38 minutes.    Note: This dictation was prepared with Dragon dictation along with smaller phrase technology. Any transcriptional errors that result from this process are unintentional.  Damany Eastman M.D on 08/14/2017 at 1:42 PM  Between 7am to 6pm - Pager - [phone] After 6pm go to [email or web address] - password [email or web address] [facility]  Office  [phone]  CC: Primary care physician; [name], DO

## 2017-08-14 NOTE — Care Management Obs Status (Signed)
[facility] NOTIFICATION   Patient Details  Name: Kristopher [name] MRN: [id number] Date of Birth: 1960/05/28   Medicare Observation Status Notification Given:  Yes permission to x    Shelbie Ammons, RN 08/14/2017, 10:04 AM

## 2017-08-14 NOTE — Progress Notes (Signed)
OT Cancellation Note  Patient Details Name: [name] MRN: [id number] DOB: [birth year]   Cancelled Treatment:     OT service initiation attempted this date. OT called and spoke with nurse, but already d/c.   Zenovia Jarred, MSOT, OTR/L   [facility] 08/14/2017, 3:42 PM

## 2017-08-14 NOTE — Progress Notes (Signed)
Discharge instructions given and went over with patient at bedside. Follow-up appointments reviewed. All questions answered. Patient discharged home with wife. Madlyn Frankel, RN

## 2017-08-14 NOTE — Progress Notes (Signed)
Patient briefly seen this morning.  Will await neurology consultation for further evaluation.  Hip x-rays show arthritis. Continue plan by admitting MD

## 2017-08-14 NOTE — Care Management Note (Signed)
Case Management Note  Patient Details  Name: GENNIE DIB MRN: [id number] Date of Birth: 02/16/1961  Subjective/Objective:    Admitted to [facility] with the diagnosis of left sided weakness under observation status, Lives with Julieta Bellini [phone]). Last seen Park Liter NP 06/26/17. Prescriptions are filled at [facility]. No home Health. No skilled facility. No home oxygen for him. No medical equipment in the home for him. Takes care of all basic activities of daily living himself, drives. Last fall was yesterday/. Good appetite. Friend will transport.                Action/Plan: Will continue to for discharge planning   Expected Discharge Date:  08/14/17               Expected Discharge Plan:     In-House Referral:     Discharge planning Services     Post Acute Care Choice:    Choice offered to:     DME Arranged:    DME Agency:     HH Arranged:    HH Agency:     Status of Service:     If discussed at [employer], dates discussed:    Additional Comments:  Shelbie Ammons, RN MSN CCM Care Management [phone] 08/14/2017, 9:54 AM

## 2017-08-14 NOTE — Progress Notes (Signed)
Patient c/o pain specifically in the left hip and is questioning the plan of care. He is also c/o migraines (takes imitrex at home). Dr. Benjie Karvonen paged and notified. See new orders. Madlyn Frankel, RN

## 2017-08-15 ENCOUNTER — Telehealth: Payer: Self-pay

## 2017-08-15 LAB — HIV ANTIBODY (ROUTINE TESTING W REFLEX): HIV Screen 4th Generation wRfx: NONREACTIVE

## 2017-08-15 NOTE — Telephone Encounter (Signed)
Transition Care Management Follow-up Telephone Call  How have you been since you were released from the hospital? "still in pain, using patches and taking ibuprofen as needed "  Do you understand why you were in the hospital? yes  Do you have a copy of your discharge instructions Yes Do you understand the discharge instrcutions? yes  Where were you discharged to? Home  Do you have support at home? Yes    Items Reviewed:  Medications obtained Yes  Medications reviewed: Yes  Dietary changes reviewed: yes  Home Health? N/A  DME ordered at discharge obtained? No  Medical supplies: NA    Functional Questionnaire:   Activities of Daily Living (ADLs):   He states they are independent in the following: ambulation, bathing and hygiene, feeding, continence, grooming, toileting, dressing and medication management States they require assistance with the following: none  Any transportation issues/concerns?: no  Any patient concerns? no  Confirmed importance and date/time of follow-up visits scheduled with PCP: yes 04/24/2017 at 2:30pm   Confirm appointment scheduled with specialist? Yes, he is to call on Monday to schedule  Confirmed with patient if condition begins to worsen call PCP or If it's emergency go to the ER.

## 2017-08-19 DIAGNOSIS — M1612 Unilateral primary osteoarthritis, left hip: Secondary | ICD-10-CM | POA: Diagnosis not present

## 2017-08-20 ENCOUNTER — Encounter: Payer: Self-pay | Admitting: Oncology

## 2017-08-21 ENCOUNTER — Ambulatory Visit
Admission: RE | Admit: 2017-08-21 | Discharge: 2017-08-21 | Disposition: A | Discharge status: Home / Self Care | Payer: Medicare Other | Source: Ambulatory Visit | Attending: Nurse Practitioner | Admitting: Nurse Practitioner

## 2017-08-21 ENCOUNTER — Inpatient Hospital Stay: Payer: Medicare Other | Attending: Nurse Practitioner | Admitting: Oncology

## 2017-08-21 DIAGNOSIS — I700 Atherosclerosis of aorta: Secondary | ICD-10-CM | POA: Insufficient documentation

## 2017-08-21 DIAGNOSIS — Z122 Encounter for screening for malignant neoplasm of respiratory organs: Secondary | ICD-10-CM | POA: Insufficient documentation

## 2017-08-21 DIAGNOSIS — Z87891 Personal history of nicotine dependence: Secondary | ICD-10-CM | POA: Diagnosis present

## 2017-08-21 DIAGNOSIS — J439 Emphysema, unspecified: Secondary | ICD-10-CM | POA: Diagnosis not present

## 2017-08-21 DIAGNOSIS — K760 Fatty (change of) liver, not elsewhere classified: Secondary | ICD-10-CM | POA: Insufficient documentation

## 2017-08-21 NOTE — Progress Notes (Signed)
In accordance with CMS guidelines, patient has met eligibility criteria including age, absence of signs or symptoms of lung cancer.  Social History   Tobacco Use  . Smoking status: Current Every Day Smoker    Packs/day: 1.00    Years: 35.00    Pack years: 35.00    Types: Cigarettes  . Smokeless tobacco: Never Used  Substance Use Topics  . Alcohol use: No    Alcohol/week: 0.0 oz  . Drug use: No     A shared decision-making session was conducted prior to the performance of CT scan. This includes one or more decision aids, includes benefits and harms of screening, follow-up diagnostic testing, over-diagnosis, false positive rate, and total radiation exposure.  Counseling on the importance of adherence to annual lung cancer LDCT screening, impact of co-morbidities, and ability or willingness to undergo diagnosis and treatment is imperative for compliance of the program.  Counseling on the importance of continued smoking cessation for former smokers; the importance of smoking cessation for current smokers, and information about tobacco cessation interventions have been given to patient including [facility] and [facility] quit [facility] programs.  Written order for lung cancer screening with LDCT has been given to the patient and any and all questions have been answered to the best of my abilities.   Yearly follow up will be coordinated by Shawn Perkins, Thoracic Navigator.  Jennifer Burns, NP 08/21/2017 2:46 PM  

## 2017-08-22 ENCOUNTER — Inpatient Hospital Stay: Payer: Medicare Other | Admitting: Family Medicine

## 2017-08-22 ENCOUNTER — Telehealth: Payer: Self-pay | Admitting: Family Medicine

## 2017-08-22 NOTE — Telephone Encounter (Signed)
He's already on meloxicam. That's an anti-inflammatory

## 2017-08-22 NOTE — Telephone Encounter (Signed)
Copied from [place] [id number]. Topic: General - Other >> Aug 21, 2017  5:31 PM Mcneil, Ja-Kwan wrote: Reason for CRM: Pt asking if an inflammatory medication can be prescribed. Cb# [phone] >> Aug 22, 2017  8:09 AM Don Perking M wrote: Pt was suppose to come in for a hospital fu but cancelled and has not rescheduled.

## 2017-08-22 NOTE — Telephone Encounter (Signed)
Patient notified

## 2017-08-25 ENCOUNTER — Encounter: Payer: Self-pay | Admitting: Family Medicine

## 2017-08-25 ENCOUNTER — Encounter: Payer: Self-pay | Admitting: *Deleted

## 2017-08-25 DIAGNOSIS — I700 Atherosclerosis of aorta: Secondary | ICD-10-CM | POA: Insufficient documentation

## 2017-08-26 NOTE — Telephone Encounter (Signed)
LVM for patient to call to schedule hospital fu.

## 2017-08-26 NOTE — Telephone Encounter (Signed)
After speaking with Dr Wynetta Emery, this pt needs to come in for a hospital fu.

## 2017-08-26 NOTE — Telephone Encounter (Signed)
Patient's girlfriend says maloxicam is not helping the patient. Is there an alternative?

## 2017-08-27 ENCOUNTER — Telehealth: Payer: Self-pay | Admitting: Family Medicine

## 2017-08-27 NOTE — Telephone Encounter (Signed)
No showed appointment.

## 2017-08-27 NOTE — Telephone Encounter (Signed)
I resubmitted referral to [facility]. They will call patient and e-mail me. Will await e-mail.

## 2017-08-27 NOTE — Telephone Encounter (Signed)
Patient would like someone to call him regarding his referral to ortho for hip replacement. He states it has been 2 weeks and he still has not heard anything from anyone.  Thank you

## 2017-08-28 ENCOUNTER — Ambulatory Visit (INDEPENDENT_AMBULATORY_CARE_PROVIDER_SITE_OTHER): Payer: Medicare Other | Admitting: Physician Assistant

## 2017-08-28 ENCOUNTER — Encounter: Payer: Self-pay | Admitting: Physician Assistant

## 2017-08-28 VITALS — BP 160/100 | HR 79 | Wt 235.0 lb

## 2017-08-28 DIAGNOSIS — M1612 Unilateral primary osteoarthritis, left hip: Secondary | ICD-10-CM

## 2017-08-28 DIAGNOSIS — G8929 Other chronic pain: Secondary | ICD-10-CM

## 2017-08-28 DIAGNOSIS — M25552 Pain in left hip: Secondary | ICD-10-CM

## 2017-08-28 DIAGNOSIS — I10 Essential (primary) hypertension: Secondary | ICD-10-CM | POA: Diagnosis not present

## 2017-08-28 MED ORDER — METOPROLOL SUCCINATE ER 25 MG PO TB24: ORAL_TABLET | ORAL | 0 refills | Status: DC

## 2017-08-28 NOTE — Patient Instructions (Signed)

## 2017-08-28 NOTE — Progress Notes (Signed)
Subjective:    Patient ID: Kristopher [name], male    DOB: [birth year], 57 y.o.   MRN: [id number]  Kristopher [name] is a 57 y.o. male presenting on 08/28/2017 for Hospitalization Follow-up (Was sent to Ortho from hospital. Told he needed a hip replacement. Pt has falled x 4 in last 2 weeks. )   HPI   Following up for hospitalization on 08/13/2017 for left sided weakness and was discharged on 08/14/2017. Presented with left sided pain and weakness of left side such that he fell out of his chair. No weakness noted in his arm on his exam, some in his left leg. CT head showed mild-moderate chronic white matter disease. MRI brain and c-spine redemonstrated white matter disease but otherwise no acute abnormality. Some cervical spondylolysis at C4-C5 and C5-C6 and disc bulge at C3-C4. CXR normal. Left hip xray showed moderate OA in both hip joints. He was given Meloxicam.   In the interim, he has seen Dr. Rudene Christians at [facility] on 08/19/2017. He is currently under a pain contract with Dr. Dossie Arbour. He says he is falling due to pain in left hip. Patient has old back and left foot pain from workman's comp injury in [place] and he wants to go through [employer] comp for this surgery. He was consented for procedure.  Most recent update as of 08/20/2017 in [place] is that surgery is pending worker's comp approval before it can be scheduled.    Social History   Tobacco Use  . Smoking status: Current Every Day Smoker    Packs/day: 1.00    Years: 35.00    Pack years: 35.00    Types: Cigarettes  . Smokeless tobacco: Never Used  Substance Use Topics  . Alcohol use: No    Alcohol/week: 0.0 oz  . Drug use: No    Review of Systems Per HPI unless specifically indicated above     Objective:    BP (!) 191/128   Pulse 79   Wt 235 lb (106.6 kg)   SpO2 98%   BMI 33.72 kg/m   Wt Readings from Last 3 Encounters:  08/28/17 235 lb (106.6 kg)  08/21/17 231 lb (104.8 kg)  08/14/17  230 lb (104.3 kg)    Physical Exam Results for orders placed or performed during the hospital encounter of 74/82/70  Basic metabolic panel  Result Value Ref Range   Sodium 138 135 - 145 mmol/L   Potassium 3.3 (L) 3.5 - 5.1 mmol/L   Chloride 102 101 - 111 mmol/L   CO2 25 22 - 32 mmol/L   Glucose, Bld 142 (H) 65 - 99 mg/dL   BUN 13 6 - 20 mg/dL   Creatinine, Ser 0.92 0.61 - 1.24 mg/dL   Calcium 9.4 8.9 - 10.3 mg/dL   GFR calc non Af Amer >60 >60 mL/min   GFR calc Af Amer >60 >60 mL/min   Anion gap 11 5 - 15  CBC  Result Value Ref Range   WBC 7.8 3.8 - 10.6 K/uL   RBC 4.59 4.40 - 5.90 MIL/uL   Hemoglobin 14.2 13.0 - 18.0 g/dL   HCT 40.3 40.0 - 52.0 %   MCV 87.7 80.0 - 100.0 fL   MCH 30.9 26.0 - 34.0 pg   MCHC 35.2 32.0 - 36.0 g/dL   RDW 13.7 11.5 - 14.5 %   Platelets 173 150 - 440 K/uL  Urinalysis, Complete w Microscopic  Result Value Ref Range   Color, Urine YELLOW (A)  YELLOW   APPearance CLEAR (A) CLEAR   Specific Gravity, Urine 1.023 1.005 - 1.030   pH 7.0 5.0 - 8.0   Glucose, UA NEGATIVE NEGATIVE mg/dL   Hgb urine dipstick NEGATIVE NEGATIVE   Bilirubin Urine NEGATIVE NEGATIVE   Ketones, ur NEGATIVE NEGATIVE mg/dL   Protein, ur NEGATIVE NEGATIVE mg/dL   Nitrite NEGATIVE NEGATIVE   Leukocytes, UA NEGATIVE NEGATIVE   RBC / HPF 0-5 0 - 5 RBC/hpf   WBC, UA 0-5 0 - 5 WBC/hpf   Bacteria, UA NONE SEEN NONE SEEN   Squamous Epithelial / LPF NONE SEEN 0 - 5   Mucus PRESENT   HIV antibody (Routine Testing)  Result Value Ref Range   HIV Screen 4th Generation wRfx Non Reactive Non Reactive  Hemoglobin A1c  Result Value Ref Range   Hgb A1c MFr Bld 6.5 (H) 4.8 - 5.6 %   Mean Plasma Glucose 139.85 mg/dL  Lipid panel  Result Value Ref Range   Cholesterol 161 0 - 200 mg/dL   Triglycerides 258 (H) <150 mg/dL   HDL 23 (L) >40 mg/dL   Total CHOL/HDL Ratio 7.0 RATIO   VLDL 52 (H) 0 - 40 mg/dL   LDL Cholesterol 86 0 - 99 mg/dL      Assessment & Plan:   1. Essential  hypertension  I have reviewed all hospital discharge notes, imaging, and labs. Medication list has been reconciled. His blood pressure is extremely elevated today. Asymptomatic. Will need better control. Increase metoprolol to 125 mg daily. May need hydralazine.   - metoprolol succinate (TOPROL-XL) 25 MG 24 hr tablet; Take 25 mg pill along with 100 mg pill to total 125 mg metoprolol daily.  Dispense: 90 tablet; Refill: 0  2. Chronic hip pain (Left)  Under pain contract. Needs THA, this is pending insurance approval. Patient will have to contact [facility] to determine status of this. Number provided.  3. Arthritis of left hip  See #2.    Follow up plan: Return in about 1 month (around 09/27/2017) for HTN.  Carles Collet, PA-C [facility] 08/28/2017, 2:40 PM

## 2017-09-23 ENCOUNTER — Telehealth: Payer: Self-pay | Admitting: Nurse Practitioner

## 2017-09-23 ENCOUNTER — Other Ambulatory Visit: Payer: Self-pay | Admitting: Nurse Practitioner

## 2017-09-23 DIAGNOSIS — M25552 Pain in left hip: Secondary | ICD-10-CM

## 2017-09-23 DIAGNOSIS — G894 Chronic pain syndrome: Secondary | ICD-10-CM

## 2017-09-23 DIAGNOSIS — G8929 Other chronic pain: Secondary | ICD-10-CM

## 2017-09-23 DIAGNOSIS — M5416 Radiculopathy, lumbar region: Secondary | ICD-10-CM

## 2017-09-23 DIAGNOSIS — M7918 Myalgia, other site: Secondary | ICD-10-CM

## 2017-09-23 MED ORDER — NONFORMULARY OR COMPOUNDED ITEM: 2 refills | Status: DC

## 2017-09-23 NOTE — Telephone Encounter (Signed)
Called patient to pick up pain cream script today

## 2017-09-23 NOTE — Telephone Encounter (Signed)
Patient needs refill on pain creams, pharmacy says scripts he has are outdated. Please let patient know when he can pick up scripts.

## 2017-09-23 NOTE — Telephone Encounter (Signed)
sent 

## 2017-09-25 ENCOUNTER — Telehealth: Payer: Self-pay | Admitting: Family Medicine

## 2017-09-25 NOTE — Telephone Encounter (Signed)
He needs to get a letter from [employer] comp that this is not going to be done under workmans comp if that's what they are telling him to do. I referred him as private insurance, not workman's comp as we do not do workman's comp.

## 2017-09-25 NOTE — Telephone Encounter (Signed)
Called and spoke with patient, he states that [facility] will not see him because the hip pain is due to arthritis and not related to workers comp, they are requiring a letter from workers comp stating that this is not related to workers comp. He states that he is in a lot of pain and unsure of what to do next.

## 2017-09-25 NOTE — Telephone Encounter (Signed)
Patient notified

## 2017-09-25 NOTE — Telephone Encounter (Signed)
Copied from [place] [id number]. Topic: Quick Communication - See Telephone Encounter >> Sep 25, 2017 10:52 AM Rutherford Nail, NT wrote: CRM for notification. See Telephone encounter for: 09/25/17. Patient calling and would like to talk to someone regarding his hip. States that the place that Dr Wynetta Emery sent him to would not do it because the pain is not due to that workman's comp claim. Please advise. CB#: [phone]

## 2017-09-30 NOTE — Progress Notes (Signed)
BP (!) 155/90 (BP Location: Left Arm, Cuff Size: Normal)   Pulse 66   Temp 97.9 F (36.6 C) (Oral)   Ht 5\' 10"  (1.778 m)   Wt 232 lb 4.8 oz (105.4 kg)   SpO2 96%   BMI 33.33 kg/m    Subjective:    Patient ID: Kristopher [name], male    DOB: [birth year], 57 y.o.   MRN: [id number]  HPI: Kristopher [name] is a 57 y.o. male  Chief Complaint  Patient presents with  . Hypertension   HYPERTENSION Hypertension status: better  Satisfied with current treatment? no Duration of hypertension: chronic BP monitoring frequency:  not checking BP medication side effects:  no Medication compliance: good compliance Previous BP meds: amlodipine-valsartan, metoprolol, hctz Aspirin: no Recurrent headaches: no Visual changes: no Palpitations: no Dyspnea: no Chest pain: no Lower extremity edema: no Dizzy/lightheaded: no  Relevant past medical, surgical, family and social history reviewed and updated as indicated. Interim medical history since our last visit reviewed. Allergies and medications reviewed and updated.  Review of Systems  Constitutional: Negative.   Respiratory: Negative.   Cardiovascular: Negative.   Psychiatric/Behavioral: Negative.     Per HPI unless specifically indicated above     Objective:    BP (!) 155/90 (BP Location: Left Arm, Cuff Size: Normal)   Pulse 66   Temp 97.9 F (36.6 C) (Oral)   Ht 5\' 10"  (1.778 m)   Wt 232 lb 4.8 oz (105.4 kg)   SpO2 96%   BMI 33.33 kg/m   Wt Readings from Last 3 Encounters:  10/01/17 232 lb 4.8 oz (105.4 kg)  08/28/17 235 lb (106.6 kg)  08/21/17 231 lb (104.8 kg)    Physical Exam  Constitutional: He is oriented to person, place, and time. He appears well-developed and well-nourished. No distress.  HENT:  Head: Normocephalic and atraumatic.  Right Ear: Hearing normal.  Left Ear: Hearing normal.  Nose: Nose normal.  Eyes: Conjunctivae and lids are normal. Right eye exhibits no discharge. Left eye exhibits no discharge.  No scleral icterus.  Cardiovascular: Normal rate, regular rhythm, normal heart sounds and intact distal pulses. Exam reveals no gallop and no friction rub.  No murmur heard. Pulmonary/Chest: Effort normal and breath sounds normal. No stridor. No respiratory distress. He has no wheezes. He has no rales. He exhibits no tenderness.  Musculoskeletal: Normal range of motion.  Neurological: He is alert and oriented to person, place, and time.  Skin: Skin is warm, dry and intact. Capillary refill takes less than 2 seconds. No rash noted. He is not diaphoretic. No erythema. No pallor.  Psychiatric: He has a normal mood and affect. His speech is normal and behavior is normal. Judgment and thought content normal. Cognition and memory are normal.  Nursing note and vitals reviewed.   Results for orders placed or performed in visit on 08/27/92  Basic metabolic panel  Result Value Ref Range   Glucose 168 (H) 65 - 99 mg/dL   BUN 13 6 - 24 mg/dL   Creatinine, Ser 1.14 0.76 - 1.27 mg/dL   GFR calc non Af Amer 72 >59 mL/min/1.73   GFR calc Af Amer 83 >59 mL/min/1.73   BUN/Creatinine Ratio 11 9 - 20   Sodium 139 134 - 144 mmol/L   Potassium 3.6 3.5 - 5.2 mmol/L   Chloride 99 96 - 106 mmol/L   CO2 22 20 - 29 mmol/L   Calcium 9.3 8.7 - 10.2 mg/dL  Assessment & Plan:   Problem List Items Addressed This Visit      Cardiovascular and Mediastinum   HTN (hypertension) - Primary    Better, but not quite there. Still may need hydralazine. Will increase his HCTZ to 50mg  and recheck 1 month. Call with any concerns.       Relevant Medications   hydrochlorothiazide (HYDRODIURIL) 50 MG tablet   Other Relevant Orders   Basic metabolic panel (Completed)     Musculoskeletal and Integument   Hip arthritis    They won't see him at [facility]. Referral generated today. Call with any concerns.       Relevant Orders   Ambulatory referral to Orthopedic Surgery       Follow up plan: Return in about 1 month  (around 10/29/2017) for Follow up BP.

## 2017-10-01 ENCOUNTER — Other Ambulatory Visit: Payer: Self-pay

## 2017-10-01 ENCOUNTER — Ambulatory Visit (INDEPENDENT_AMBULATORY_CARE_PROVIDER_SITE_OTHER): Payer: Medicare Other | Admitting: Family Medicine

## 2017-10-01 ENCOUNTER — Encounter: Payer: Self-pay | Admitting: Family Medicine

## 2017-10-01 VITALS — BP 155/90 | HR 66 | Temp 97.9°F | Ht 70.0 in | Wt 232.3 lb

## 2017-10-01 DIAGNOSIS — I10 Essential (primary) hypertension: Secondary | ICD-10-CM

## 2017-10-01 DIAGNOSIS — M1610 Unilateral primary osteoarthritis, unspecified hip: Secondary | ICD-10-CM | POA: Diagnosis not present

## 2017-10-01 MED ORDER — ESOMEPRAZOLE MAGNESIUM 40 MG PO CPDR: 40.0000 mg | DELAYED_RELEASE_CAPSULE | Freq: Every day | ORAL | 3 refills | Status: DC

## 2017-10-01 MED ORDER — HYDROCHLOROTHIAZIDE 50 MG PO TABS: 50.0000 mg | ORAL_TABLET | Freq: Every day | ORAL | 1 refills | Status: DC

## 2017-10-01 NOTE — Assessment & Plan Note (Signed)
Better, but not quite there. Still may need hydralazine. Will increase his HCTZ to 50mg  and recheck 1 month. Call with any concerns.

## 2017-10-02 ENCOUNTER — Encounter: Payer: Self-pay | Admitting: Family Medicine

## 2017-10-02 LAB — BASIC METABOLIC PANEL
BUN/Creatinine Ratio: 11 (ref 9–20)
BUN: 13 mg/dL (ref 6–24)
CO2: 22 mmol/L (ref 20–29)
Calcium: 9.3 mg/dL (ref 8.7–10.2)
Chloride: 99 mmol/L (ref 96–106)
Creatinine, Ser: 1.14 mg/dL (ref 0.76–1.27)
GFR calc Af Amer: 83 mL/min/{1.73_m2} (ref >59)
GFR calc non Af Amer: 72 mL/min/{1.73_m2} (ref >59)
Glucose: 168 mg/dL — ABNORMAL HIGH (ref 65–99)
Potassium: 3.6 mmol/L (ref 3.5–5.2)
Sodium: 139 mmol/L (ref 134–144)

## 2017-10-02 NOTE — Assessment & Plan Note (Signed)
They won't see him at [facility]. Referral generated today. Call with any concerns.

## 2017-10-06 ENCOUNTER — Telehealth: Payer: Self-pay | Admitting: *Deleted

## 2017-10-06 NOTE — Telephone Encounter (Signed)
[facility] workers called to confirm date of injury for which the compounded pain cream was ordered.  Spoke with patient to confirm, states the injury was from October 2011 and was originally prescribed by Dr Jani Gravel. Information given to pharmacy.

## 2017-10-07 ENCOUNTER — Telehealth: Payer: Self-pay | Admitting: Nurse Practitioner

## 2017-10-07 NOTE — Telephone Encounter (Signed)
Patient is still having problem getting pain cream filled. Please call asap

## 2017-10-07 NOTE — Telephone Encounter (Signed)
Left voicemail with patient re; pain cream and if there is any additional information that he can give me and I will pass this along to [place] if appropriate.

## 2017-10-07 NOTE — Telephone Encounter (Signed)
Patient phoned in about compounding cream, information given that Date of Injury that he gave yesterday was incorrect and Blanch Media had just talked to [facility] and given the 2014 date which was listed the chart that she has for him.   Also, he wanted to let us know that he was going to have a hip replacement with Dr Rudene Christians on the left hip.  Told him about surgeons letter and instructed to please pick this up to give to Dr Rudene Christians with rational.  Patient verbalizes u/o information.

## 2017-10-08 ENCOUNTER — Encounter: Payer: Self-pay | Admitting: Physician Assistant

## 2017-10-08 ENCOUNTER — Telehealth: Payer: Self-pay | Admitting: Nurse Practitioner

## 2017-10-08 ENCOUNTER — Ambulatory Visit (INDEPENDENT_AMBULATORY_CARE_PROVIDER_SITE_OTHER): Payer: Medicare Other | Admitting: Physician Assistant

## 2017-10-08 ENCOUNTER — Telehealth: Payer: Self-pay | Admitting: Family Medicine

## 2017-10-08 ENCOUNTER — Other Ambulatory Visit: Payer: Self-pay

## 2017-10-08 VITALS — BP 155/110 | HR 73 | Temp 98.7°F | Ht 70.0 in | Wt 231.0 lb

## 2017-10-08 DIAGNOSIS — M1612 Unilateral primary osteoarthritis, left hip: Secondary | ICD-10-CM | POA: Diagnosis not present

## 2017-10-08 DIAGNOSIS — I10 Essential (primary) hypertension: Secondary | ICD-10-CM | POA: Diagnosis not present

## 2017-10-08 NOTE — Telephone Encounter (Signed)
error 

## 2017-10-08 NOTE — Telephone Encounter (Signed)
Noted, thank you

## 2017-10-08 NOTE — Patient Instructions (Signed)

## 2017-10-08 NOTE — Progress Notes (Signed)
Subjective:    Patient ID: Kristopher [name], male    DOB: [birth year], 57 y.o.   MRN: [id number]  Kristopher [name] is a 57 y.o. male presenting on 10/08/2017 for Hypertension   HPI  Patient has a longstanding history of HTN. He reports he has had high blood pressure since he was a young teenager. He is currently on amlodipine-valsartan 10-320 mg, Cardura 4 mg, HCTZ 50 mg, metoprolol succinate 125 mg daily. He was at his orthopedist today to get assessed for left hip replacement and his blood pressure was 160/128. His surgeon has refused to do the surgery until he gets his BP under control. He denies chest pain, SOB, headache today. Reports he has never had secondary workup for HTN.   Of note, he drinks six cases of [facility] daily. Says he has severe withdrawal symptoms from mountain dew when he stops drinking it. Also continues to smoke.   BP Readings from Last 3 Encounters:  10/08/17 (!) 155/110  10/01/17 (!) 155/90  08/28/17 (!) 160/100    Social History   Tobacco Use  . Smoking status: Current Every Day Smoker    Packs/day: 1.00    Years: 35.00    Pack years: 35.00    Types: Cigarettes  . Smokeless tobacco: Never Used  Substance Use Topics  . Alcohol use: No    Alcohol/week: 0.0 standard drinks  . Drug use: No    Review of Systems Per HPI unless specifically indicated above     Objective:    BP (!) 155/110   Pulse 73   Temp 98.7 F (37.1 C) (Oral)   Ht 5\' 10"  (1.778 m)   Wt 231 lb (104.8 kg)   SpO2 97%   BMI 33.15 kg/m   Wt Readings from Last 3 Encounters:  10/08/17 231 lb (104.8 kg)  10/01/17 232 lb 4.8 oz (105.4 kg)  08/28/17 235 lb (106.6 kg)    Physical Exam  Constitutional: He is oriented to person, place, and time. He appears well-developed and well-nourished.  Cardiovascular: Normal rate and regular rhythm.  Pulmonary/Chest: Effort normal and breath sounds normal.  Neurological: He is alert and oriented to person, place, and time.  Skin: Skin  is warm and dry.  Psychiatric: He has a normal mood and affect. His behavior is normal.   Results for orders placed or performed in visit on 10/08/17  Aldosterone + renin activity w/ ratio  Result Value Ref Range   ALDOSTERONE WILL FOLLOW    Renin WILL FOLLOW    ALDOS/RENIN RATIO WILL FOLLOW   TSH  Result Value Ref Range   TSH 1.350 0.450 - 4.500 uIU/mL      Assessment & Plan:  1. Malignant hypertension  Uncontrolled, patient thinks this is due to pain. It has been better in the past but he is on >3 medications with longstanding history of HTN and will pursue further workup. Will start as below and refer to nephrology. Absolutely has to stop drinking mountain dew and needs to stop smoking. Follow up in one week with Dr. Wynetta Emery for discussion of labwork and any medication changes. Will not change medications today.   - Aldosterone + renin activity w/ ratio - Comprehensive metabolic panel - Metanephrines, Urine, 24 hour - US Renal Artery Stenosis; Future - TSH - Ambulatory referral to Nephrology     Follow up plan: Return in about 1 week (around 10/15/2017) for HTN.  Carles Collet, PA-C [facility]  10/09/2017, 1:30 PM

## 2017-10-08 NOTE — Telephone Encounter (Signed)
Copied from [place] [id number]. Topic: Quick Communication - See Telephone Encounter >> Oct 08, 2017  9:11 AM Bea Graff, NT wrote: CRM for notification. See Telephone encounter for: 10/08/17. [facility] with Dr. Rudene Christians office calling and states pt needs to be seen with his PCP office today for a BP pf 160/128. I got him scheduled at 10:00am with Creig Hines. Called to let [place] aware of appointment as well.

## 2017-10-11 LAB — TSH: TSH: 1.35 u[IU]/mL (ref 0.450–4.500)

## 2017-10-11 LAB — ALDOSTERONE + RENIN ACTIVITY W/ RATIO
ALDOS/RENIN RATIO: 37.3 — ABNORMAL HIGH (ref 0.0–30.0)
ALDOSTERONE: 7.2 ng/dL (ref 0.0–30.0)
Renin: 0.193 ng/mL/hr (ref 0.167–5.380)

## 2017-10-15 ENCOUNTER — Ambulatory Visit (INDEPENDENT_AMBULATORY_CARE_PROVIDER_SITE_OTHER): Payer: Medicare Other | Admitting: Physician Assistant

## 2017-10-15 ENCOUNTER — Encounter: Payer: Self-pay | Admitting: Physician Assistant

## 2017-10-15 VITALS — BP 153/99 | HR 71 | Temp 98.2°F | Ht 70.0 in | Wt 232.9 lb

## 2017-10-15 DIAGNOSIS — I10 Essential (primary) hypertension: Secondary | ICD-10-CM

## 2017-10-15 MED ORDER — HYDRALAZINE HCL 10 MG PO TABS: 10.0000 mg | ORAL_TABLET | Freq: Three times a day (TID) | ORAL | 0 refills | Status: DC

## 2017-10-15 NOTE — Progress Notes (Signed)
   Subjective:    Patient ID: Kristopher [name], male    DOB: June 01, 1960, 57 y.o.   MRN: [id number]  Kristopher [name] is a 57 y.o. male presenting on 10/15/2017 for Hypertension   HPI   Presents today for follow up of HTN. He has reduced his mountain dew intake from 6 picks daily to 2.5 packs daily. He reports his morning blood pressure is 138/109. His aldosterone/renin ratio is slightly elevated but renin and aldosterone remain within normal limits. His TSH is normal. Last CMET normal. He dropped off urine metanephrines test today. He continues to smoke.   Renal artery ultrasound has not been scheduled and nephrology referral has been placed but appointment not scheduled.  He has a surgery scheduled for total hip replacement in 2 weeks and orthopedist has reported he will not do the surgery if his blood pressure is too high.   Social History   Tobacco Use  . Smoking status: Current Every Day Smoker    Packs/day: 1.00    Years: 35.00    Pack years: 35.00    Types: Cigarettes  . Smokeless tobacco: Never Used  Substance Use Topics  . Alcohol use: No    Alcohol/week: 0.0 standard drinks  . Drug use: No    Review of Systems Per HPI unless specifically indicated above     Objective:    BP (!) 153/99 (BP Location: Left Arm, Cuff Size: Large)   Pulse 71   Temp 98.2 F (36.8 C) (Oral)   Ht 5\' 10"  (1.778 m)   Wt 232 lb 14.4 oz (105.6 kg)   SpO2 98%   BMI 33.42 kg/m   Wt Readings from Last 3 Encounters:  10/15/17 232 lb 14.4 oz (105.6 kg)  10/08/17 231 lb (104.8 kg)  10/01/17 232 lb 4.8 oz (105.4 kg)    Physical Exam  Constitutional: He is oriented to person, place, and time. He appears well-developed and well-nourished.  Cardiovascular: Normal rate and regular rhythm.  Pulmonary/Chest: Effort normal and breath sounds normal.  Neurological: He is alert and oriented to person, place, and time.   Results for orders placed or performed in visit on 10/08/17  Aldosterone +  renin activity w/ ratio  Result Value Ref Range   ALDOSTERONE 7.2 0.0 - 30.0 ng/dL   Renin 0.193 0.167 - 5.380 ng/mL/hr   ALDOS/RENIN RATIO 37.3 (H) 0.0 - 30.0  TSH  Result Value Ref Range   TSH 1.350 0.450 - 4.500 uIU/mL      Assessment & Plan:  1. Essential hypertension  Add hydralazine in addition to his other medications. Will check on status of referrals and imaging and see him back in one week.   - hydrALAZINE (APRESOLINE) 10 MG tablet; Take 1 tablet (10 mg total) by mouth 3 (three) times daily.  Dispense: 90 tablet; Refill: 0    Follow up plan: No follow-ups on file.  Carles Collet, PA-C [facility] 10/15/2017, 9:46 AM

## 2017-10-15 NOTE — Patient Instructions (Signed)

## 2017-10-16 NOTE — Progress Notes (Signed)
Gave pt referral phone number. [place] has been trying to get a hold of him.

## 2017-10-18 LAB — METANEPHRINES, URINE, 24 HOUR
Metaneph Total, Ur: 219 ug/L
Metanephrines, 24H Ur: 296 ug/24 hr — ABNORMAL HIGH (ref 45–290)
Normetanephrine, 24H Ur: 934 ug/24 hr — ABNORMAL HIGH (ref 82–500)
Normetanephrine, Ur: 692 ug/L

## 2017-10-21 ENCOUNTER — Ambulatory Visit: Payer: Medicare Other | Admitting: Nurse Practitioner

## 2017-10-21 ENCOUNTER — Encounter: Payer: Self-pay | Admitting: Nurse Practitioner

## 2017-10-21 ENCOUNTER — Encounter
Admission: RE | Admit: 2017-10-21 | Discharge: 2017-10-21 | Disposition: A | Discharge status: Home / Self Care | Payer: Medicare Other | Source: Ambulatory Visit | Attending: Orthopedic Surgery | Admitting: Orthopedic Surgery

## 2017-10-21 ENCOUNTER — Encounter (INDEPENDENT_AMBULATORY_CARE_PROVIDER_SITE_OTHER): Payer: Self-pay

## 2017-10-21 ENCOUNTER — Other Ambulatory Visit: Payer: Self-pay

## 2017-10-21 VITALS — BP 138/106 | HR 83 | Temp 97.8°F | Resp 16 | Ht 70.0 in | Wt 230.0 lb

## 2017-10-21 DIAGNOSIS — M47816 Spondylosis without myelopathy or radiculopathy, lumbar region: Secondary | ICD-10-CM | POA: Diagnosis not present

## 2017-10-21 DIAGNOSIS — Z01812 Encounter for preprocedural laboratory examination: Secondary | ICD-10-CM | POA: Diagnosis not present

## 2017-10-21 DIAGNOSIS — M1612 Unilateral primary osteoarthritis, left hip: Secondary | ICD-10-CM | POA: Diagnosis not present

## 2017-10-21 DIAGNOSIS — I10 Essential (primary) hypertension: Secondary | ICD-10-CM | POA: Insufficient documentation

## 2017-10-21 DIAGNOSIS — F17210 Nicotine dependence, cigarettes, uncomplicated: Secondary | ICD-10-CM | POA: Diagnosis not present

## 2017-10-21 DIAGNOSIS — Z79899 Other long term (current) drug therapy: Secondary | ICD-10-CM | POA: Insufficient documentation

## 2017-10-21 DIAGNOSIS — G894 Chronic pain syndrome: Secondary | ICD-10-CM | POA: Insufficient documentation

## 2017-10-21 DIAGNOSIS — M79605 Pain in left leg: Secondary | ICD-10-CM | POA: Diagnosis not present

## 2017-10-21 DIAGNOSIS — M47812 Spondylosis without myelopathy or radiculopathy, cervical region: Secondary | ICD-10-CM | POA: Insufficient documentation

## 2017-10-21 DIAGNOSIS — G8929 Other chronic pain: Secondary | ICD-10-CM

## 2017-10-21 DIAGNOSIS — M25552 Pain in left hip: Secondary | ICD-10-CM

## 2017-10-21 DIAGNOSIS — M5416 Radiculopathy, lumbar region: Secondary | ICD-10-CM | POA: Diagnosis not present

## 2017-10-21 DIAGNOSIS — M545 Low back pain: Secondary | ICD-10-CM | POA: Diagnosis not present

## 2017-10-21 DIAGNOSIS — M7918 Myalgia, other site: Secondary | ICD-10-CM

## 2017-10-21 LAB — PROTIME-INR
INR: 1.06
Prothrombin Time: 13.7 seconds (ref 11.4–15.2)

## 2017-10-21 LAB — URINALYSIS, ROUTINE W REFLEX MICROSCOPIC
Bacteria, UA: NONE SEEN
Bilirubin Urine: NEGATIVE
Glucose, UA: >=500 mg/dL — AB
Hgb urine dipstick: NEGATIVE
Ketones, ur: NEGATIVE mg/dL
Leukocytes, UA: NEGATIVE
Nitrite: NEGATIVE
Protein, ur: 30 mg/dL — AB
Specific Gravity, Urine: 1.025 (ref 1.005–1.030)
pH: 5 (ref 5.0–8.0)

## 2017-10-21 LAB — CBC
HCT: 43.2 % (ref 40.0–52.0)
Hemoglobin: 15 g/dL (ref 13.0–18.0)
MCH: 30.7 pg (ref 26.0–34.0)
MCHC: 34.7 g/dL (ref 32.0–36.0)
MCV: 88.5 fL (ref 80.0–100.0)
Platelets: 187 10*3/uL (ref 150–440)
RBC: 4.88 MIL/uL (ref 4.40–5.90)
RDW: 13.7 % (ref 11.5–14.5)
WBC: 7.3 10*3/uL (ref 3.8–10.6)

## 2017-10-21 LAB — SURGICAL PCR SCREEN
MRSA, PCR: NEGATIVE
Staphylococcus aureus: NEGATIVE

## 2017-10-21 LAB — BASIC METABOLIC PANEL
Anion gap: 8 (ref 5–15)
BUN: 22 mg/dL — ABNORMAL HIGH (ref 6–20)
CO2: 30 mmol/L (ref 22–32)
Calcium: 9.5 mg/dL (ref 8.9–10.3)
Chloride: 98 mmol/L (ref 98–111)
Creatinine, Ser: 1.09 mg/dL (ref 0.61–1.24)
GFR calc Af Amer: >60 mL/min (ref >60)
GFR calc non Af Amer: >60 mL/min (ref >60)
Glucose, Bld: 184 mg/dL — ABNORMAL HIGH (ref 70–99)
Potassium: 3.2 mmol/L — ABNORMAL LOW (ref 3.5–5.1)
Sodium: 136 mmol/L (ref 135–145)

## 2017-10-21 LAB — TYPE AND SCREEN
ABO/RH(D): A POS
Antibody Screen: NEGATIVE

## 2017-10-21 LAB — SEDIMENTATION RATE: Sed Rate: 11 mm/hr (ref 0–20)

## 2017-10-21 LAB — APTT: aPTT: 25 seconds (ref 24–36)

## 2017-10-21 MED ORDER — OXYCODONE HCL 10 MG PO TABS: 10.0000 mg | ORAL_TABLET | Freq: Four times a day (QID) | ORAL | 0 refills | Status: DC | PRN

## 2017-10-21 MED ORDER — NONFORMULARY OR COMPOUNDED ITEM: 2 refills | Status: DC

## 2017-10-21 NOTE — Progress Notes (Signed)
Patient's Name: Kristopher [name]  MRN: [id number]  Referring Provider: [name], DO  DOB: [birth year]  PCP: [name], DO  DOS: 10/21/2017  Note by: Vevelyn Francois NP  Service setting: Ambulatory outpatient  Specialty: Interventional Pain Management  Location: [facility] (AMB) Pain Management Facility    Patient type: Established    Primary Reason(s) for Visit: Encounter for prescription drug management. (Level of risk: moderate)  CC: Hip Pain (left); Back Pain (lower); and Leg Pain (left)  HPI  Kristopher [name] is a 57 y.o. year old, male patient, who comes today for a medication management evaluation. He has Chronic low back pain (Location of Primary Source of Pain) (Bilateral) (L>R); Lumbar spondylosis; Chronic lumbar radicular pain (Location of Secondary source of pain) (S1 dermatomal) (Left); 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); Chronic neck pain (posterior midline) (Bilateral) (L>R); Cervical spondylosis; Chronic cervical radicular pain (Location of Tertiary source of pain) (Bilateral) (C5/C6 dermatome) (L>R); Long term current use of opiate analgesic; Long term prescription opiate use; Opiate use (60 MME/Day); Opioid dependence ([place]); Encounter for therapeutic drug level monitoring; Cervical spinal stenosis (C4-5); Cervical foraminal stenosis (Bilateral C5-6); Hypokalemia; Retrolisthesis of L5-S1; Cervical disc herniation (C4-5 and C5-6); Lumbar disc herniation (L5-S1); Chronic sacroiliac joint pain (Bilateral) (L>R); Allergy history, anesthetic (Unconfirmed allergy to Lidocaine); Chronic hip pain (Left); Lumbar facet syndrome (Location of Primary Source of Pain) (Bilateral) (L>R); Chronic lower extremity pain (Location of Secondary source of pain) (Left); Chronic upper extremity pain (Location of Tertiary source of pain) (Bilateral) (L>R); Greater occipital neuralgia (Right); Chronic pain syndrome; Benign hypertensive renal disease; Migraine; Special screening for  malignant neoplasms, colon; Benign neoplasm of ascending colon; Polyp of sigmoid colon; Rectal polyp; Tobacco abuse; GERD (gastroesophageal reflux disease); Musculoskeletal pain, chronic; Left-sided weakness; HTN (hypertension); Aortic atherosclerosis ([place]); and Hip arthritis on their problem list. His primarily concern today is the Hip Pain (left); Back Pain (lower); and Leg Pain (left)  Pain Assessment: Location: Left Hip Radiating: down left leg to foot, includes toes Onset: More than a month ago Duration: Chronic pain Quality: Constant(feels like a chainsaw ripping my leg apart") Severity: 7 /10 (subjective, self-reported pain score)  Note: Reported level is compatible with observation. Clinically the patient looks like a 3/10 A 3/10 is viewed as "Moderate" and described as significantly interfering with activities of daily living (ADL). It becomes difficult to feed, bathe, get dressed, get on and off the toilet or to perform personal hygiene functions. Difficult to get in and out of bed or a chair without assistance. Very distracting. With effort, it can be ignored when deeply involved in activities. Information on the proper use of the pain scale provided to the patient today. When using our objective Pain Scale, levels between 6 and 10/10 are said to belong in an emergency room, as it progressively worsens from a 6/10, described as severely limiting, requiring emergency care not usually available at an outpatient pain management facility. At a 6/10 level, communication becomes difficult and requires great effort. Assistance to reach the emergency department may be required. Facial flushing and profuse sweating along with potentially dangerous increases in heart rate and blood pressure will be evident. Effect on ADL: limited in all things; pt goes from bed to couch all day Timing: Constant Modifying factors: meds, staying still BP: (!) 138/106  HR: 83  Kristopher [name] was last scheduled for an  appointment on 10/08/2017 for medication management. During today's appointment we reviewed Kristopher [name] chronic pain status, as  well as his outpatient medication regimen. He admits that he is going to have his left hip replacement on  8/27 with Dr Rudene Christians. He has stinging and burning pain on the left side. He admits that it affects all his feet. His toes are numb. "They feel like they are frozen". He is currently using the Compound cream for his pain. This is a topical agent but it is really effective in this pain management in between Oxycodone dosing. He admits that this makes his pain tolerable.   The patient  reports that he does not use drugs. His body mass index is 33 kg/m.  Further details on both, my assessment(s), as well as the proposed treatment plan, please see below.  Controlled Substance Pharmacotherapy Assessment REMS (Risk Evaluation and Mitigation Strategy)  Analgesic:Oxycodone IR 10 mg every 6 hours (40 mg/day of oxycodone) MME/day:60 mg/day    Rise Patience, RN  10/21/2017  9:53 AM  Sign at close encounter Nursing Pain Medication Assessment:  Safety precautions to be maintained throughout the outpatient stay will include: orient to surroundings, keep bed in low position, maintain call bell within reach at all times, provide assistance with transfer out of bed and ambulation.  Medication Inspection Compliance: Pill count conducted under aseptic conditions, in front of the patient. Neither the pills nor the bottle was removed from the patient's sight at any time. Once count was completed pills were immediately returned to the patient in their original bottle.  Medication: oxycodone 10 mg Pill/Patch Count: 36 of 120 pills remain Pill/Patch Appearance: Markings consistent with prescribed medication Bottle Appearance: Standard pharmacy container. Clearly labeled. Filled Date: 7 / 30 / 2019 Last Medication intake:  Today   Pharmacokinetics: Liberation and absorption (onset of  action): WNL Distribution (time to peak effect): WNL Metabolism and excretion (duration of action): WNL         Pharmacodynamics: Desired effects: Analgesia: Kristopher [name] reports >50% benefit. Functional ability: Patient reports that medication allows him to accomplish basic ADLs Clinically meaningful improvement in function (CMIF): Sustained CMIF goals met Perceived effectiveness: Described as relatively effective, allowing for increase in activities of daily living (ADL) Undesirable effects: Side-effects or Adverse reactions: None reported Monitoring: [place] PMP: Online review of the past 22-monthperiod conducted. Compliant with practice rules and regulations Last UDS on record: Summary  Date Value Ref Range Status  07/21/2017 FINAL  Final    Comment:    ==================================================================== TOXASSURE SELECT 13 (MW) ==================================================================== Test                             Result       Flag       Units Drug Present and Declared for Prescription Verification   Oxycodone                      2395         EXPECTED   ng/mg creat   Oxymorphone                    1083         EXPECTED   ng/mg creat   Noroxycodone                   1172         EXPECTED   ng/mg creat   Noroxymorphone  160          EXPECTED   ng/mg creat    Sources of oxycodone are scheduled prescription medications.    Oxymorphone, noroxycodone, and noroxymorphone are expected    metabolites of oxycodone. Oxymorphone is also available as a    scheduled prescription medication. ==================================================================== Test                      Result    Flag   Units      Ref Range   Creatinine              213              mg/dL      >=20 ==================================================================== Declared Medications:  The flagging and interpretation on this report are based on the  following  declared medications.  Unexpected results may arise from  inaccuracies in the declared medications.  **Note: The testing scope of this panel includes these medications:  Oxycodone  **Note: The testing scope of this panel does not include following  reported medications:  Albuterol  Amlodipine  Baclofen  Doxazosin (Cardura)  Hydrochlorothiazide (Hydrodiuril)  Meloxicam (Mobic)  Metoprolol  Naloxone (Narcan)  Omeprazole (Prilosec)  Sumatriptan ==================================================================== For clinical consultation, please call [phone]. ====================================================================    UDS interpretation: Compliant          Medication Assessment Form: Reviewed. Patient indicates being compliant with therapy Treatment compliance: Compliant Risk Assessment Profile: Aberrant behavior: See prior evaluations. None observed or detected today Comorbid factors increasing risk of overdose: See prior notes. No additional risks detected today Opioid risk tool (ORT) (Total Score): 0 Personal History of Substance Abuse (SUD-Substance use disorder):  Alcohol: Negative  Illegal Drugs: Negative  Rx Drugs: Negative  ORT Risk Level calculation: Low Risk Risk of substance use disorder (SUD): Low Opioid Risk Tool - 10/21/17 0913      Family History of Substance Abuse   Alcohol  Negative    Illegal Drugs  Negative    Rx Drugs  Negative      Personal History of Substance Abuse   Alcohol  Negative    Illegal Drugs  Negative    Rx Drugs  Negative      Age   Age between 28-45 years   No      History of Preadolescent Sexual Abuse   History of Preadolescent Sexual Abuse  Negative or Male      Psychological Disease   Psychological Disease  Negative    Depression  Negative      Total Score   Opioid Risk Tool Scoring  0    Opioid Risk Interpretation  Low Risk      ORT Scoring interpretation table:  Score <3 = Low Risk for SUD  Score  between 4-7 = Moderate Risk for SUD  Score >8 = High Risk for Opioid Abuse   Risk Mitigation Strategies:  Patient Counseling: Covered Patient-Prescriber Agreement (PPA): Present and active  Notification to other healthcare providers: Done  Pharmacologic Plan: No change in therapy, at this time.             Laboratory Chemistry  Inflammation Markers (CRP: Acute Phase) (ESR: Chronic Phase) Lab Results  Component Value Date   CRP 0.7 04/03/2015   ESRSEDRATE 11 04/03/2015                         Rheumatology Markers No results found for: RF, ANA, LABURIC, URICUR,  LYMEIGGIGMAB, LYMEABIGMQN, HLAB27                      Renal Function Markers Lab Results  Component Value Date   BUN 13 10/01/2017   CREATININE 1.14 10/01/2017   BCR 11 10/01/2017   GFRAA 83 10/01/2017   GFRNONAA 72 10/01/2017                             Hepatic Function Markers Lab Results  Component Value Date   AST 39 03/31/2017   ALT 57 (H) 03/31/2017   ALBUMIN 4.7 03/31/2017   ALKPHOS 113 03/31/2017                        Electrolytes Lab Results  Component Value Date   NA 139 10/01/2017   K 3.6 10/01/2017   CL 99 10/01/2017   CALCIUM 9.3 10/01/2017   MG 2.0 04/03/2015                        Neuropathy Markers Lab Results  Component Value Date   HGBA1C 6.5 (H) 08/14/2017   HIV Non Reactive 08/14/2017                        Bone Pathology Markers No results found for: [place], [id number], HX5056PV9, [id number], 25OHVITD1, 25OHVITD2, 25OHVITD3, TESTOFREE, TESTOSTERONE                       Coagulation Parameters Lab Results  Component Value Date   PLT 173 08/13/2017                        Cardiovascular Markers Lab Results  Component Value Date   HGB 14.2 08/13/2017   HCT 40.3 08/13/2017                         CA Markers No results found for: CEA, CA125, LABCA2                      Note: Lab results reviewed.  Recent Diagnostic Imaging Results  CT CHEST LUNG CANCER SCREENING LOW  DOSE WO CONTRAST CLINICAL DATA:  Thirty-five pack-year smoker. Current smoker. Asymptomatic.  EXAM: CT CHEST WITHOUT CONTRAST LOW-DOSE FOR LUNG CANCER SCREENING  TECHNIQUE: Multidetector CT imaging of the chest was performed following the standard protocol without IV contrast.  COMPARISON:  Chest radiograph 08/13/2017  FINDINGS: Cardiovascular: Aortic atherosclerosis. Normal heart size, without pericardial effusion.  Mediastinum/Nodes: No mediastinal or definite hilar adenopathy, given limitations of unenhanced CT.  Lungs/Pleura: No pleural fluid. Mild to moderate centrilobular emphysema. Bilateral pulmonary nodules of maximally volume derived equivalent diameter 5.6 mm.  Upper Abdomen: Mild to moderate hepatic steatosis. Old granulomatous disease in the liver and spleen. Normal imaged portions of the stomach, pancreas, adrenal glands, kidneys.  Musculoskeletal: No acute osseous abnormality.  IMPRESSION: 1. Lung-RADS 2, benign appearance or behavior. Continue annual screening with low-dose chest CT without contrast in 12 months. 2. Aortic atherosclerosis (ICD10-I70.0) and emphysema (ICD10-J43.9). 3. Hepatic steatosis  Electronically Signed   By: Abigail Miyamoto M.D.   On: 08/22/2017 08:34  Complexity Note: Imaging results reviewed. Results shared with Kristopher [name], using Layman's terms.  Meds   Current Outpatient Medications:  .  amLODipine-valsartan (EXFORGE) 10-320 MG tablet, Take 1 tablet by mouth daily., Disp: 90 tablet, Rfl: 1 .  baclofen (LIORESAL) 20 MG tablet, Take 1 tablet (20 mg total) by mouth at bedtime as needed for muscle spasms., Disp: 30 each, Rfl: 3 .  diphenhydrAMINE (BENADRYL) 25 MG tablet, Take 25 mg by mouth 2 (two) times daily as needed for itching., Disp: , Rfl:  .  doxazosin (CARDURA) 4 MG tablet, Take 1 tablet (4 mg total) by mouth daily., Disp: 90 tablet, Rfl: 3 .  esomeprazole (NEXIUM) 40 MG capsule, Take 1 capsule (40 mg  total) by mouth daily. (Patient taking differently: Take 40 mg by mouth daily as needed (acid reflux). ), Disp: 90 capsule, Rfl: 3 .  hydrALAZINE (APRESOLINE) 10 MG tablet, Take 1 tablet (10 mg total) by mouth 3 (three) times daily., Disp: 90 tablet, Rfl: 0 .  hydrochlorothiazide (HYDRODIURIL) 50 MG tablet, Take 1 tablet (50 mg total) by mouth daily. (Patient taking differently: Take 50 mg by mouth at bedtime. ), Disp: 90 tablet, Rfl: 1 .  meloxicam (MOBIC) 15 MG tablet, Take 1 tablet (15 mg total) by mouth daily., Disp: 30 tablet, Rfl: 3 .  metoprolol succinate (TOPROL-XL) 100 MG 24 hr tablet, Take 100 mg by mouth at bedtime as needed (if bp is 150/107 or higher). , Disp: , Rfl:  .  metoprolol succinate (TOPROL-XL) 25 MG 24 hr tablet, Take 25 mg pill along with 100 mg pill to total 125 mg metoprolol daily. (Patient taking differently: Take 25 mg by mouth daily. ), Disp: 90 tablet, Rfl: 0 .  NONFORMULARY OR COMPOUNDED ITEM, 10% Ketamine/2% Cyclobenzaprine/6% Gabapentin Cream Sig: 1-2 ml to affected area 3-4 times/day. Amount: 240 GM, Disp: 240 each, Rfl: 2 .  [START ON 12/29/2017] Oxycodone HCl 10 MG TABS, Take 1 tablet (10 mg total) by mouth every 6 (six) hours as needed., Disp: 120 tablet, Rfl: 0 .  potassium chloride (KLOR-CON) 20 MEQ packet, Take 20 mEq by mouth daily., Disp: , Rfl:  .  SUMAtriptan (IMITREX) 100 MG tablet, Take 1 tablet earliest onset of headache.  May repeat once after 2 hours if headache persists or recurs.  Do not exceed 2 tablets in 24 hrs, Disp: 10 tablet, Rfl: 12 .  [START ON 11/29/2017] Oxycodone HCl 10 MG TABS, Take 1 tablet (10 mg total) by mouth every 6 (six) hours as needed., Disp: 120 tablet, Rfl: 0 .  [START ON 10/30/2017] Oxycodone HCl 10 MG TABS, Take 1 tablet (10 mg total) by mouth every 6 (six) hours as needed., Disp: 120 tablet, Rfl: 0  ROS  Constitutional: Denies any fever or chills Gastrointestinal: No reported hemesis, hematochezia, vomiting, or acute GI  distress Musculoskeletal: Denies any acute onset joint swelling, redness, loss of ROM, or weakness Neurological: No reported episodes of acute onset apraxia, aphasia, dysarthria, agnosia, amnesia, paralysis, loss of coordination, or loss of consciousness  Allergies  Kristopher [name] is allergic to aspirin; epinephrine; lidocaine; novocain [procaine]; penicillins; and strawberry extract.  Kristopher  Drug: Kristopher. [name]  reports that he does not use drugs. Alcohol:  reports that he does not drink alcohol. Tobacco:  reports that he has been smoking cigarettes. He has a 35.00 pack-year smoking history. He has never used smokeless tobacco. Medical:  has a past medical history of Allergy, Arthritis, Chronic back pain, Hypertension, and Migraines. Surgical: Kristopher [name]  has a past surgical history that includes Back surgery; Appendectomy; Leg Surgery; Knee  surgery (Left); Foot surgery (Left); Colonoscopy with propofol (N/A, 02/19/2016); and polypectomy (N/A, 02/19/2016). Family: family history includes Cancer in his father; Diabetes in his sister; Thrombosis in his sister.  Constitutional Exam  General appearance: Well nourished, well developed, and well hydrated. In no apparent acute distress Vitals:   10/21/17 0905  BP: (!) 138/106  Pulse: 83  Resp: 16  Temp: 97.8 F (36.6 C)  TempSrc: Oral  SpO2: 98%  Weight: 230 lb (104.3 kg)  Height: 5' 10"  (1.778 m)  Psych/Mental status: Alert, oriented x 3 (person, place, & time)       Eyes: PERLA Respiratory: No evidence of acute respiratory distress   Lumbar Spine Area Exam  Skin & Axial Inspection: Well healed scar from previous spine surgery detected Alignment: Symmetrical Functional ROM: Unrestricted ROM       Stability: No instability detected Muscle Tone/Strength: Functionally intact. No obvious neuro-muscular anomalies detected. Sensory (Neurological): Unimpaired Palpation: Complains of area being tender to palpation       Provocative  Tests: Hyperextension/rotation test: Positive       Lateral bending test: Unable to perform       Patrick's Maneuver: deferred today due to pain              Gait & Posture Assessment  Ambulation: Patient ambulates using a cane Gait: Limited. Using assistive device to ambulate Posture: Antalgic   Lower Extremity Exam    Side: Right lower extremity  Side: Left lower extremity  Stability: No instability observed          Stability: No instability observed          Skin & Extremity Inspection: Skin color, temperature, and hair growth are WNL. No peripheral edema or cyanosis. No masses, redness, swelling, asymmetry, or associated skin lesions. No contractures.  Skin & Extremity Inspection: Skin color, temperature, and hair growth are WNL. No peripheral edema or cyanosis. No masses, redness, swelling, asymmetry, or associated skin lesions. No contractures.  Functional ROM: Unrestricted ROM                  Functional ROM: Unrestricted ROM                  Muscle Tone/Strength: Movement possible against gravity, but not against resistance (3/5)  Muscle Tone/Strength: Functionally intact. No obvious neuro-muscular anomalies detected.  Sensory (Neurological): Unimpaired  Sensory (Neurological): Unimpaired  Palpation: Tender  Palpation: No palpable anomalies   Assessment  Primary Diagnosis & Pertinent Problem List: The primary encounter diagnosis was Chronic lumbar radicular pain (Location of Secondary source of pain) (S1 dermatomal) (Left). Diagnoses of Chronic hip pain (Left), Lumbar spondylosis, Osteoarthritis of cervical spine, unspecified spinal osteoarthritis complication status, Musculoskeletal pain, chronic, and Chronic pain syndrome were also pertinent to this visit.  Status Diagnosis  Persistent Worsening Persistent 1. Chronic lumbar radicular pain (Location of Secondary source of pain) (S1 dermatomal) (Left)   2. Chronic hip pain (Left)   3. Lumbar spondylosis   4. Osteoarthritis of  cervical spine, unspecified spinal osteoarthritis complication status   5. Musculoskeletal pain, chronic   6. Chronic pain syndrome     Problems updated and reviewed during this visit: No problems updated. Plan of Care  Pharmacotherapy (Medications Ordered): Meds ordered this encounter  Medications  . NONFORMULARY OR COMPOUNDED ITEM    Sig: 10% Ketamine/2% Cyclobenzaprine/6% Gabapentin Cream Sig: 1-2 ml to affected area 3-4 times/day. Amount: 240 GM    Dispense:  240 each    Refill:  2    Order Specific Question:   Supervising Provider    Answer:   [name] [phone]  . Oxycodone HCl 10 MG TABS    Sig: Take 1 tablet (10 mg total) by mouth every 6 (six) hours as needed.    Dispense:  120 tablet    Refill:  0    Do not place this medication, or any other prescription from our practice, on "Automatic Refill". Patient may have prescription filled one day early if pharmacy is closed on scheduled refill date. Do not fill until: 12/29/2017 To last until:01/28/2018    Order Specific Question:   Supervising Provider    Answer:   [name] [phone]  . Oxycodone HCl 10 MG TABS    Sig: Take 1 tablet (10 mg total) by mouth every 6 (six) hours as needed.    Dispense:  120 tablet    Refill:  0    Do not place this medication, or any other prescription from our practice, on "Automatic Refill". Patient may have prescription filled one day early if pharmacy is closed on scheduled refill date. Do not fill until:11/29/2017 To last until:12/29/2017    Order Specific Question:   Supervising Provider    Answer:   [name] [phone]  . Oxycodone HCl 10 MG TABS    Sig: Take 1 tablet (10 mg total) by mouth every 6 (six) hours as needed.    Dispense:  120 tablet    Refill:  0    Do not place this medication, or any other prescription from our practice, on "Automatic Refill". Patient may have prescription filled one day early if pharmacy is closed on scheduled refill date. Do  not fill until:10/30/2017 To last until:11/29/2017    Order Specific Question:   Supervising Provider    Answer:   [name] [phone]   New Prescriptions   No medications on file   Medications administered today: Kristopher [name] had no medications administered during this visit. Lab-work, procedure(s), and/or referral(s): No orders of the defined types were placed in this encounter.  Imaging and/or referral(s): None  Interventional therapies: Planned, scheduled, and/or pending: Not at this time. The use of the Compound cream is part of Kristopher [name] treatment regimen and is medically necessary to help with his pain management.    Considering: None at this time   Palliative PRN treatment(s): None at this time   rovider-requested follow-up: Return in about 3 months (around 01/21/2018) for MedMgmt with Me Donella Stade [facility]).  Future Appointments  Date Time Provider [place]  10/22/2017  8:45 AM Paulene Floor [facility]  11/10/2017  1:15 PM [name], DO [name]  01/20/2018  9:30 AM Vevelyn Francois, NP [facility] None  08/12/2018  8:00 AM CFP NURSE HEALTH ADVISOR [name]   Primary Care Physician: [name], DO Location: [facility] Note by: Vevelyn Francois NP Date: 10/21/2017; Time: 11:10 AM  Pain Score Disclaimer: We use the NRS-11 scale. This is a self-reported, subjective measurement of pain severity with only modest accuracy. It is used primarily to identify changes within a particular patient. It must be understood that outpatient pain scales are significantly less accurate that those used for research, where they can be applied under ideal controlled circumstances with minimal exposure to variables. In reality, the score is likely to be a combination of pain intensity and pain affect, where pain affect describes the degree of emotional arousal or changes in  action readiness caused by the sensory  experience of pain. Factors such as social and work situation, setting, emotional state, anxiety levels, expectation, and prior pain experience may influence pain perception and show large inter-individual differences that may also be affected by time variables.  Patient instructions provided during this appointment: Patient Instructions   You have been given 3 Rx for oxycodone to last until 01/28/2018 and a Rx for nonformulary compound. ____________________________________________________________________________________________  Medication Rules  Applies to: All patients receiving prescriptions (written or electronic).  Pharmacy of record: Pharmacy where electronic prescriptions will be sent. If written prescriptions are taken to a different pharmacy, please inform the nursing staff. The pharmacy listed in the electronic medical record should be the one where you would like electronic prescriptions to be sent.  Prescription refills: Only during scheduled appointments. Applies to both, written and electronic prescriptions.  NOTE: The following applies primarily to controlled substances (Opioid* Pain Medications).   Patient's responsibilities: 1. Pain Pills: Bring all pain pills to every appointment (except for procedure appointments). 2. Pill Bottles: Bring pills in original pharmacy bottle. Always bring newest bottle. Bring bottle, even if empty. 3. Medication refills: You are responsible for knowing and keeping track of what medications you need refilled. The day before your appointment, write a list of all prescriptions that need to be refilled. Bring that list to your appointment and give it to the admitting nurse. Prescriptions will be written only during appointments. If you forget a medication, it will not be "Called in", "Faxed", or "electronically sent". You will need to get another appointment to get these prescribed. 4. Prescription Accuracy: You are responsible for carefully  inspecting your prescriptions before leaving our office. Have the discharge nurse carefully go over each prescription with you, before taking them home. Make sure that your name is accurately spelled, that your address is correct. Check the name and dose of your medication to make sure it is accurate. Check the number of pills, and the written instructions to make sure they are clear and accurate. Make sure that you are given enough medication to last until your next medication refill appointment. 5. Taking Medication: Take medication as prescribed. Never take more pills than instructed. Never take medication more frequently than prescribed. Taking less pills or less frequently is permitted and encouraged, when it comes to controlled substances (written prescriptions).  6. Inform other Doctors: Always inform, all of your healthcare providers, of all the medications you take. 7. Pain Medication from other Providers: You are not allowed to accept any additional pain medication from any other Doctor or Healthcare provider. There are two exceptions to this rule. (see below) In the event that you require additional pain medication, you are responsible for notifying us, as stated below. 8. Medication Agreement: You are responsible for carefully reading and following our Medication Agreement. This must be signed before receiving any prescriptions from our practice. Safely store a copy of your signed Agreement. Violations to the Agreement will result in no further prescriptions. (Additional copies of our Medication Agreement are available upon request.) 9. Laws, Rules, & Regulations: All patients are expected to follow all Federal and [employer], [employer], Rules, [employer]. Ignorance of the Laws does not constitute a valid excuse. The use of any illegal substances is prohibited. 10. Adopted CDC guidelines & recommendations: Target dosing levels will be at or below 60 MME/day. Use of benzodiazepines** is not  recommended.  Exceptions: There are only two exceptions to the rule of not receiving pain medications  from other Healthcare Providers. 1. Exception #1 (Emergencies): In the event of an emergency (i.e.: accident requiring emergency care), you are allowed to receive additional pain medication. However, you are responsible for: As soon as you are able, call our office [phone], at any time of the day or night, and leave a message stating your name, the date and nature of the emergency, and the name and dose of the medication prescribed. In the event that your call is answered by a member of our staff, make sure to document and save the date, time, and the name of the person that took your information.  2. Exception #2 (Planned Surgery): In the event that you are scheduled by another doctor or dentist to have any type of surgery or procedure, you are allowed (for a period no longer than 30 days), to receive additional pain medication, for the acute post-op pain. However, in this case, you are responsible for picking up a copy of our "Post-op Pain Management for Surgeons" handout, and giving it to your surgeon or dentist. This document is available at our office, and does not require an appointment to obtain it. Simply go to our office during business hours (Monday-Thursday from 8:00 AM to 4:00 PM) (Friday 8:00 AM to 12:00 Noon) or if you have a scheduled appointment with [place], prior to your surgery, and ask for it by name. In addition, you will need to provide [place] with your name, name of your surgeon, type of surgery, and date of procedure or surgery.  *Opioid medications include: morphine, codeine, oxycodone, oxymorphone, hydrocodone, hydromorphone, meperidine, tramadol, tapentadol, buprenorphine, fentanyl, methadone. **Benzodiazepine medications include: diazepam (Valium), alprazolam (Xanax), clonazepam (Klonopine), lorazepam (Ativan), clorazepate (Tranxene), chlordiazepoxide (Librium), estazolam (Prosom),  oxazepam (Serax), temazepam (Restoril), triazolam (Halcion) (Last updated: 05/01/2017) ____________________________________________________________________________________________   ____________________________________________________________________________________________  Pain Scale  Introduction: The pain score used by this practice is the Verbal Numerical Rating Scale (VNRS-11). This is an 11-point scale. It is for adults and children 10 years or older. There are significant differences in how the pain score is reported, used, and applied. Forget everything you learned in the past and learn this scoring system.  General Information: The scale should reflect your current level of pain. Unless you are specifically asked for the level of your worst pain, or your average pain. If you are asked for one of these two, then it should be understood that it is over the past 24 hours.  Basic Activities of Daily Living (ADL): Personal hygiene, dressing, eating, transferring, and using restroom.  Instructions: Most patients tend to report their level of pain as a combination of two factors, their physical pain and their psychosocial pain. This last one is also known as "suffering" and it is reflection of how physical pain affects you socially and psychologically. From now on, report them separately. From this point on, when asked to report your pain level, report only your physical pain. Use the following table for reference.  Pain Clinic Pain Levels (0-5/10)  Pain Level Score  Description  No Pain 0   Mild pain 1 Nagging, annoying, but does not interfere with basic activities of daily living (ADL). Patients are able to eat, bathe, get dressed, toileting (being able to get on and off the toilet and perform personal hygiene functions), transfer (move in and out of bed or a chair without assistance), and maintain continence (able to control bladder and bowel functions). Blood pressure and heart rate are  unaffected. A normal heart rate for a  healthy adult ranges from 60 to 100 bpm (beats per minute).   Mild to moderate pain 2 Noticeable and distracting. Impossible to hide from other people. More frequent flare-ups. Still possible to adapt and function close to normal. It can be very annoying and may have occasional stronger flare-ups. With discipline, patients may get used to it and adapt.   Moderate pain 3 Interferes significantly with activities of daily living (ADL). It becomes difficult to feed, bathe, get dressed, get on and off the toilet or to perform personal hygiene functions. Difficult to get in and out of bed or a chair without assistance. Very distracting. With effort, it can be ignored when deeply involved in activities.   Moderately severe pain 4 Impossible to ignore for more than a few minutes. With effort, patients may still be able to manage work or participate in some social activities. Very difficult to concentrate. Signs of autonomic nervous system discharge are evident: dilated pupils (mydriasis); mild sweating (diaphoresis); sleep interference. Heart rate becomes elevated (>115 bpm). Diastolic blood pressure (lower number) rises above 100 mmHg. Patients find relief in laying down and not moving.   Severe pain 5 Intense and extremely unpleasant. Associated with frowning face and frequent crying. Pain overwhelms the senses.  Ability to do any activity or maintain social relationships becomes significantly limited. Conversation becomes difficult. Pacing back and forth is common, as getting into a comfortable position is nearly impossible. Pain wakes you up from deep sleep. Physical signs will be obvious: pupillary dilation; increased sweating; goosebumps; brisk reflexes; cold, clammy hands and feet; nausea, vomiting or dry heaves; loss of appetite; significant sleep disturbance with inability to fall asleep or to remain asleep. When persistent, significant weight loss is observed due to  the complete loss of appetite and sleep deprivation.  Blood pressure and heart rate becomes significantly elevated. Caution: If elevated blood pressure triggers a pounding headache associated with blurred vision, then the patient should immediately seek attention at an urgent or emergency care unit, as these may be signs of an impending stroke.    Emergency Department Pain Levels (6-10/10)  Emergency Room Pain 6 Severely limiting. Requires emergency care and should not be seen or managed at an outpatient pain management facility. Communication becomes difficult and requires great effort. Assistance to reach the emergency department may be required. Facial flushing and profuse sweating along with potentially dangerous increases in heart rate and blood pressure will be evident.   Distressing pain 7 Self-care is very difficult. Assistance is required to transport, or use restroom. Assistance to reach the emergency department will be required. Tasks requiring coordination, such as bathing and getting dressed become very difficult.   Disabling pain 8 Self-care is no longer possible. At this level, pain is disabling. The individual is unable to do even the most "basic" activities such as walking, eating, bathing, dressing, transferring to a bed, or toileting. Fine motor skills are lost. It is difficult to think clearly.   Incapacitating pain 9 Pain becomes incapacitating. Thought processing is no longer possible. Difficult to remember your own name. Control of movement and coordination are lost.   The worst pain imaginable 10 At this level, most patients pass out from pain. When this level is reached, collapse of the autonomic nervous system occurs, leading to a sudden drop in blood pressure and heart rate. This in turn results in a temporary and dramatic drop in blood flow to the brain, leading to a loss of consciousness. Fainting is one of the body's  self defense mechanisms. Passing out puts the brain in a  calmed state and causes it to shut down for a while, in order to begin the healing process.    Summary: 1. Refer to this scale when providing [place] with your pain level. 2. Be accurate and careful when reporting your pain level. This will help with your care. 3. Over-reporting your pain level will lead to loss of credibility. 4. Even a level of 1/10 means that there is pain and will be treated at our facility. 5. High, inaccurate reporting will be documented as "Symptom Exaggeration", leading to loss of credibility and suspicions of possible secondary gains such as obtaining more narcotics, or wanting to appear disabled, for fraudulent reasons. 6. Only pain levels of 5 or below will be seen at our facility. 7. Pain levels of 6 and above will be sent to the Emergency Department and the appointment cancelled. ____________________________________________________________________________________________    BMI Assessment: Estimated body mass index is 33 kg/m as calculated from the following:   Height as of this encounter: 5' 10"  (1.778 m).   Weight as of this encounter: 230 lb (104.3 kg).  BMI interpretation table: BMI level Category Range association with higher incidence of chronic pain  <18 kg/m2 Underweight   18.5-24.9 kg/m2 Ideal body weight   25-29.9 kg/m2 Overweight Increased incidence by 20%  30-34.9 kg/m2 Obese (Class I) Increased incidence by 68%  35-39.9 kg/m2 Severe obesity (Class II) Increased incidence by 136%  >40 kg/m2 Extreme obesity (Class III) Increased incidence by 254%   Patient's current BMI Ideal Body weight  Body mass index is 33 kg/m. Ideal body weight: 73 kg (160 lb 15 oz) Adjusted ideal body weight: 85.5 kg (188 lb 9 oz)   BMI Readings from Last 4 Encounters:  10/21/17 33.00 kg/m  10/15/17 33.42 kg/m  10/08/17 33.15 kg/m  10/01/17 33.33 kg/m   Wt Readings from Last 4 Encounters:  10/21/17 230 lb (104.3 kg)  10/15/17 232 lb 14.4 oz (105.6 kg)  10/08/17  231 lb (104.8 kg)  10/01/17 232 lb 4.8 oz (105.4 kg)

## 2017-10-21 NOTE — Patient Instructions (Addendum)
You have been given 3 Rx for oxycodone to last until 01/28/2018 and a Rx for nonformulary compound. ____________________________________________________________________________________________  Medication Rules  Applies to: All patients receiving prescriptions (written or electronic).  Pharmacy of record: Pharmacy where electronic prescriptions will be sent. If written prescriptions are taken to a different pharmacy, please inform the nursing staff. The pharmacy listed in the electronic medical record should be the one where you would like electronic prescriptions to be sent.  Prescription refills: Only during scheduled appointments. Applies to both, written and electronic prescriptions.  NOTE: The following applies primarily to controlled substances (Opioid* Pain Medications).   Patient's responsibilities: 1. Pain Pills: Bring all pain pills to every appointment (except for procedure appointments). 2. Pill Bottles: Bring pills in original pharmacy bottle. Always bring newest bottle. Bring bottle, even if empty. 3. Medication refills: You are responsible for knowing and keeping track of what medications you need refilled. The day before your appointment, write a list of all prescriptions that need to be refilled. Bring that list to your appointment and give it to the admitting nurse. Prescriptions will be written only during appointments. If you forget a medication, it will not be "Called in", "Faxed", or "electronically sent". You will need to get another appointment to get these prescribed. 4. Prescription Accuracy: You are responsible for carefully inspecting your prescriptions before leaving our office. Have the discharge nurse carefully go over each prescription with you, before taking them home. Make sure that your name is accurately spelled, that your address is correct. Check the name and dose of your medication to make sure it is accurate. Check the number of pills, and the written  instructions to make sure they are clear and accurate. Make sure that you are given enough medication to last until your next medication refill appointment. 5. Taking Medication: Take medication as prescribed. Never take more pills than instructed. Never take medication more frequently than prescribed. Taking less pills or less frequently is permitted and encouraged, when it comes to controlled substances (written prescriptions).  6. Inform other Doctors: Always inform, all of your healthcare providers, of all the medications you take. 7. Pain Medication from other Providers: You are not allowed to accept any additional pain medication from any other Doctor or Healthcare provider. There are two exceptions to this rule. (see below) In the event that you require additional pain medication, you are responsible for notifying us, as stated below. 8. Medication Agreement: You are responsible for carefully reading and following our Medication Agreement. This must be signed before receiving any prescriptions from our practice. Safely store a copy of your signed Agreement. Violations to the Agreement will result in no further prescriptions. (Additional copies of our Medication Agreement are available upon request.) 9. Laws, Rules, & Regulations: All patients are expected to follow all Federal and [employer], [employer], Rules, [employer]. Ignorance of the Laws does not constitute a valid excuse. The use of any illegal substances is prohibited. 10. Adopted CDC guidelines & recommendations: Target dosing levels will be at or below 60 MME/day. Use of benzodiazepines** is not recommended.  Exceptions: There are only two exceptions to the rule of not receiving pain medications from other Healthcare Providers. 1. Exception #1 (Emergencies): In the event of an emergency (i.e.: accident requiring emergency care), you are allowed to receive additional pain medication. However, you are responsible for: As soon as you are  able, call our office [phone], at any time of the day or night, and leave a message  stating your name, the date and nature of the emergency, and the name and dose of the medication prescribed. In the event that your call is answered by a member of our staff, make sure to document and save the date, time, and the name of the person that took your information.  2. Exception #2 (Planned Surgery): In the event that you are scheduled by another doctor or dentist to have any type of surgery or procedure, you are allowed (for a period no longer than 30 days), to receive additional pain medication, for the acute post-op pain. However, in this case, you are responsible for picking up a copy of our "Post-op Pain Management for Surgeons" handout, and giving it to your surgeon or dentist. This document is available at our office, and does not require an appointment to obtain it. Simply go to our office during business hours (Monday-Thursday from 8:00 AM to 4:00 PM) (Friday 8:00 AM to 12:00 Noon) or if you have a scheduled appointment with [place], prior to your surgery, and ask for it by name. In addition, you will need to provide [place] with your name, name of your surgeon, type of surgery, and date of procedure or surgery.  *Opioid medications include: morphine, codeine, oxycodone, oxymorphone, hydrocodone, hydromorphone, meperidine, tramadol, tapentadol, buprenorphine, fentanyl, methadone. **Benzodiazepine medications include: diazepam (Valium), alprazolam (Xanax), clonazepam (Klonopine), lorazepam (Ativan), clorazepate (Tranxene), chlordiazepoxide (Librium), estazolam (Prosom), oxazepam (Serax), temazepam (Restoril), triazolam (Halcion) (Last updated: 05/01/2017) ____________________________________________________________________________________________   ____________________________________________________________________________________________  Pain Scale  Introduction: The pain score used by this practice is  the Verbal Numerical Rating Scale (VNRS-11). This is an 11-point scale. It is for adults and children 10 years or older. There are significant differences in how the pain score is reported, used, and applied. Forget everything you learned in the past and learn this scoring system.  General Information: The scale should reflect your current level of pain. Unless you are specifically asked for the level of your worst pain, or your average pain. If you are asked for one of these two, then it should be understood that it is over the past 24 hours.  Basic Activities of Daily Living (ADL): Personal hygiene, dressing, eating, transferring, and using restroom.  Instructions: Most patients tend to report their level of pain as a combination of two factors, their physical pain and their psychosocial pain. This last one is also known as "suffering" and it is reflection of how physical pain affects you socially and psychologically. From now on, report them separately. From this point on, when asked to report your pain level, report only your physical pain. Use the following table for reference.  Pain Clinic Pain Levels (0-5/10)  Pain Level Score  Description  No Pain 0   Mild pain 1 Nagging, annoying, but does not interfere with basic activities of daily living (ADL). Patients are able to eat, bathe, get dressed, toileting (being able to get on and off the toilet and perform personal hygiene functions), transfer (move in and out of bed or a chair without assistance), and maintain continence (able to control bladder and bowel functions). Blood pressure and heart rate are unaffected. A normal heart rate for a healthy adult ranges from 60 to 100 bpm (beats per minute).   Mild to moderate pain 2 Noticeable and distracting. Impossible to hide from other people. More frequent flare-ups. Still possible to adapt and function close to normal. It can be very annoying and may have occasional stronger flare-ups. With  discipline, patients may get  used to it and adapt.   Moderate pain 3 Interferes significantly with activities of daily living (ADL). It becomes difficult to feed, bathe, get dressed, get on and off the toilet or to perform personal hygiene functions. Difficult to get in and out of bed or a chair without assistance. Very distracting. With effort, it can be ignored when deeply involved in activities.   Moderately severe pain 4 Impossible to ignore for more than a few minutes. With effort, patients may still be able to manage work or participate in some social activities. Very difficult to concentrate. Signs of autonomic nervous system discharge are evident: dilated pupils (mydriasis); mild sweating (diaphoresis); sleep interference. Heart rate becomes elevated (>115 bpm). Diastolic blood pressure (lower number) rises above 100 mmHg. Patients find relief in laying down and not moving.   Severe pain 5 Intense and extremely unpleasant. Associated with frowning face and frequent crying. Pain overwhelms the senses.  Ability to do any activity or maintain social relationships becomes significantly limited. Conversation becomes difficult. Pacing back and forth is common, as getting into a comfortable position is nearly impossible. Pain wakes you up from deep sleep. Physical signs will be obvious: pupillary dilation; increased sweating; goosebumps; brisk reflexes; cold, clammy hands and feet; nausea, vomiting or dry heaves; loss of appetite; significant sleep disturbance with inability to fall asleep or to remain asleep. When persistent, significant weight loss is observed due to the complete loss of appetite and sleep deprivation.  Blood pressure and heart rate becomes significantly elevated. Caution: If elevated blood pressure triggers a pounding headache associated with blurred vision, then the patient should immediately seek attention at an urgent or emergency care unit, as these may be signs of an impending  stroke.    Emergency Department Pain Levels (6-10/10)  Emergency Room Pain 6 Severely limiting. Requires emergency care and should not be seen or managed at an outpatient pain management facility. Communication becomes difficult and requires great effort. Assistance to reach the emergency department may be required. Facial flushing and profuse sweating along with potentially dangerous increases in heart rate and blood pressure will be evident.   Distressing pain 7 Self-care is very difficult. Assistance is required to transport, or use restroom. Assistance to reach the emergency department will be required. Tasks requiring coordination, such as bathing and getting dressed become very difficult.   Disabling pain 8 Self-care is no longer possible. At this level, pain is disabling. The individual is unable to do even the most "basic" activities such as walking, eating, bathing, dressing, transferring to a bed, or toileting. Fine motor skills are lost. It is difficult to think clearly.   Incapacitating pain 9 Pain becomes incapacitating. Thought processing is no longer possible. Difficult to remember your own name. Control of movement and coordination are lost.   The worst pain imaginable 10 At this level, most patients pass out from pain. When this level is reached, collapse of the autonomic nervous system occurs, leading to a sudden drop in blood pressure and heart rate. This in turn results in a temporary and dramatic drop in blood flow to the brain, leading to a loss of consciousness. Fainting is one of the body's self defense mechanisms. Passing out puts the brain in a calmed state and causes it to shut down for a while, in order to begin the healing process.    Summary: 1. Refer to this scale when providing [place] with your pain level. 2. Be accurate and careful when reporting your pain level. This  will help with your care. 3. Over-reporting your pain level will lead to loss of credibility. 4. Even  a level of 1/10 means that there is pain and will be treated at our facility. 5. High, inaccurate reporting will be documented as "Symptom Exaggeration", leading to loss of credibility and suspicions of possible secondary gains such as obtaining more narcotics, or wanting to appear disabled, for fraudulent reasons. 6. Only pain levels of 5 or below will be seen at our facility. 7. Pain levels of 6 and above will be sent to the Emergency Department and the appointment cancelled. ____________________________________________________________________________________________    BMI Assessment: Estimated body mass index is 33 kg/m as calculated from the following:   Height as of this encounter: 5\' 10"  (1.778 m).   Weight as of this encounter: 230 lb (104.3 kg).  BMI interpretation table: BMI level Category Range association with higher incidence of chronic pain  <18 kg/m2 Underweight   18.5-24.9 kg/m2 Ideal body weight   25-29.9 kg/m2 Overweight Increased incidence by 20%  30-34.9 kg/m2 Obese (Class I) Increased incidence by 68%  35-39.9 kg/m2 Severe obesity (Class II) Increased incidence by 136%  >40 kg/m2 Extreme obesity (Class III) Increased incidence by 254%   Patient's current BMI Ideal Body weight  Body mass index is 33 kg/m. Ideal body weight: 73 kg (160 lb 15 oz) Adjusted ideal body weight: 85.5 kg (188 lb 9 oz)   BMI Readings from Last 4 Encounters:  10/21/17 33.00 kg/m  10/15/17 33.42 kg/m  10/08/17 33.15 kg/m  10/01/17 33.33 kg/m   Wt Readings from Last 4 Encounters:  10/21/17 230 lb (104.3 kg)  10/15/17 232 lb 14.4 oz (105.6 kg)  10/08/17 231 lb (104.8 kg)  10/01/17 232 lb 4.8 oz (105.4 kg)

## 2017-10-21 NOTE — Patient Instructions (Signed)
  Your procedure is scheduled on: Tuesday October 28, 2017 Report to Same Day Surgery 2nd floor medical mall ([place] Entrance-take elevator on left to 2nd floor.  Check in with surgery information desk.) To find out your arrival time please call [phone] between 1PM - 3PM on Monday October 27, 2017  Remember: Instructions that are not followed completely may result in serious medical risk, up to and including death, or upon the discretion of your surgeon and anesthesiologist your surgery may need to be rescheduled.    _x___ 1. Do not eat food (including mints, candies, chewing gum) after midnight the night before your procedure. You may drink clear liquids up to 2 hours before you are scheduled to arrive at the hospital for your procedure.  Do not drink clear liquids within 2 hours of your scheduled arrival to the hospital.  Clear liquids include  --Water or Apple juice without pulp  --Clear carbohydrate beverage such as Gatorade  --Black Coffee or Clear Tea (No milk, no creamers, do not add anything to the coffee or tea)    __x__ 2. No Alcohol for 24 hours before or after surgery.   __x__ 3. No Smoking or e-cigarettes for 24 prior to surgery.  Do not use any chewable tobacco products for at least 6 hour prior to surgery   __x__ 4. Notify your doctor if there is any change in your medical condition (cold, fever, infections).   __x__ 5. On the morning of surgery brush your teeth with toothpaste and water.  You may rinse your mouth with mouth wash if you wish.  Do not swallow any toothpaste or mouthwash.  Please read over the following fact sheets that you were given:   [facility] Preparing for Surgery and or MRSA Information    __x__ Use CHG Soap or sage wipes as directed on instruction sheet    Do not wear jewelry.  Do not wear lotions, powders, deodorant, or perfumes.   Do not shave below the face/neck 48 hours prior to surgery.   Do not bring valuables to the hospital.     [facility] is not responsible for any belongings or valuables.               Contacts, dentures or bridgework may not be worn into surgery.  Leave your suitcase in the car. After surgery it may be brought to your room.  For patients admitted to the hospital, discharge time is determined by your treatment team.   _x___ Take anti-hypertensive listed below, cardiac, seizure, asthma, anti-reflux and psychiatric medicines. These include:  1. Doxazosin/Cardura  2. Hydralazine/Apresoline  3. Metoprolol/Toprol  4. Oxycodone  5. Diphenhydramine/Benadryl  DO NOT TAKE Amlodipine/Exforge or Hydrochlorothiazide/Hydrodiruil on the day of surgery.   _x___ Follow recommendations from Cardiologist, Pulmonologist or PCP regarding stopping Aspirin, Coumadin, Plavix ,Eliquis, Effient, or Pradaxa, and Pletal.  _x___ NOW: Stop Anti-inflammatories such as Meloxicam/Mobic, Advil, Aleve, Ibuprofen, Motrin, Naproxen, Naprosyn, Goodies powders or aspirin products. OK to take Tylenol and Celebrex.   _x___ NOW: Stop supplements until after surgery.  But may continue Vitamin D, Vitamin B, and multivitamin.

## 2017-10-21 NOTE — Progress Notes (Signed)
Nursing Pain Medication Assessment:  Safety precautions to be maintained throughout the outpatient stay will include: orient to surroundings, keep bed in low position, maintain call bell within reach at all times, provide assistance with transfer out of bed and ambulation.  Medication Inspection Compliance: Pill count conducted under aseptic conditions, in front of the patient. Neither the pills nor the bottle was removed from the patient's sight at any time. Once count was completed pills were immediately returned to the patient in their original bottle.  Medication: oxycodone 10 mg Pill/Patch Count: 36 of 120 pills remain Pill/Patch Appearance: Markings consistent with prescribed medication Bottle Appearance: Standard pharmacy container. Clearly labeled. Filled Date: 7 / 30 / 2019 Last Medication intake:  Today

## 2017-10-21 NOTE — Pre-Procedure Instructions (Signed)
Dr. Theodore Demark office faxed w/ request for K supplement prior to surgery and lab results- K 3.2, serum glucose 184, urine glucose >500. DOS I-stat ordered by this RN.

## 2017-10-21 NOTE — Pre-Procedure Instructions (Signed)
REQUESTING MEDICAL CLEARANCE REGARDING HTN CALLED AND FAXED TO CATHY AT [facility] . FAXED FYI TO DR [facility]

## 2017-10-22 ENCOUNTER — Ambulatory Visit (INDEPENDENT_AMBULATORY_CARE_PROVIDER_SITE_OTHER): Payer: Medicare Other | Admitting: Physician Assistant

## 2017-10-22 ENCOUNTER — Encounter: Payer: Self-pay | Admitting: Physician Assistant

## 2017-10-22 ENCOUNTER — Other Ambulatory Visit: Payer: Self-pay | Admitting: *Deleted

## 2017-10-22 ENCOUNTER — Telehealth: Payer: Self-pay

## 2017-10-22 VITALS — BP 153/98 | HR 77 | Wt 230.0 lb

## 2017-10-22 DIAGNOSIS — R002 Palpitations: Secondary | ICD-10-CM

## 2017-10-22 DIAGNOSIS — I10 Essential (primary) hypertension: Secondary | ICD-10-CM | POA: Diagnosis not present

## 2017-10-22 LAB — URINE CULTURE: Culture: NO GROWTH

## 2017-10-22 NOTE — Progress Notes (Signed)
Subjective:    Patient ID: Kristopher [name], male    DOB: [birth year], 57 y.o.   MRN: [id number]  Kristopher [name] is a 57 y.o. male presenting on 10/22/2017 for Medical Clearance (Left Hip replacement upcoming. Form in room. )   HPI   Patient returning today for follow up of HTN. Last visit he was started on hydralazine 10 mg TID. He is currently on the following medications as below, which totals six blood pressure medications. His blood pressure is not controlled. He says when he wakes up his BP is 137/97 and it is just when he moves that his blood pressure goes up.   Additionally, he reports waking up in the middle of the night sweating with palpitations and anxiety. He says he drinks a lot of mountain dew. He says recently he was hospitalized for similar symptoms and was told it was mountain dew withdrawal.   He also presents with surgical clearance. He is scheduled to have left hip replacement with Dr. Rudene Christians on 10/28/2017. Dr. Rudene Christians previously sent this patient to this clinic with HTN saying he wouldn't perform the surgery without optimization of his BP. Denies history of anesthetic complications. Denies heart problem history. He does not have chest pain or SOB. Does not have diabetes. Does not have sleep apnea. Is not on blood thinners.    Current Outpatient Medications on File Prior to Visit  Medication Sig Dispense Refill  . amLODipine-valsartan (EXFORGE) 10-320 MG tablet Take 1 tablet by mouth daily. 90 tablet 1  . baclofen (LIORESAL) 20 MG tablet Take 1 tablet (20 mg total) by mouth at bedtime as needed for muscle spasms. 30 each 3  . diphenhydrAMINE (BENADRYL) 25 MG tablet Take 25 mg by mouth 2 (two) times daily as needed for itching.    [name] doxazosin (CARDURA) 4 MG tablet Take 1 tablet (4 mg total) by mouth daily. 90 tablet 3  . esomeprazole (NEXIUM) 40 MG capsule Take 1 capsule (40 mg total) by mouth daily. (Patient taking differently: Take 40 mg by mouth daily as needed (acid reflux).  ) 90 capsule 3  . hydrALAZINE (APRESOLINE) 10 MG tablet Take 1 tablet (10 mg total) by mouth 3 (three) times daily. 90 tablet 0  . hydrochlorothiazide (HYDRODIURIL) 50 MG tablet Take 1 tablet (50 mg total) by mouth daily. (Patient taking differently: Take 50 mg by mouth at bedtime. ) 90 tablet 1  . meloxicam (MOBIC) 15 MG tablet Take 1 tablet (15 mg total) by mouth daily. 30 tablet 3  . metoprolol succinate (TOPROL-XL) 100 MG 24 hr tablet Take 100 mg by mouth at bedtime as needed (if bp is 150/107 or higher).     . metoprolol succinate (TOPROL-XL) 25 MG 24 hr tablet Take 25 mg pill along with 100 mg pill to total 125 mg metoprolol daily. (Patient taking differently: Take 25 mg by mouth daily. ) 90 tablet 0  . NONFORMULARY OR COMPOUNDED ITEM 10% Ketamine/2% Cyclobenzaprine/6% Gabapentin Cream Sig: 1-2 ml to affected area 3-4 times/day. Amount: 240 GM 240 each 2  . [START ON 12/29/2017] Oxycodone HCl 10 MG TABS Take 1 tablet (10 mg total) by mouth every 6 (six) hours as needed. 120 tablet 0  . [START ON 11/29/2017] Oxycodone HCl 10 MG TABS Take 1 tablet (10 mg total) by mouth every 6 (six) hours as needed. 120 tablet 0  . [START ON 10/30/2017] Oxycodone HCl 10 MG TABS Take 1 tablet (10 mg total) by mouth every 6 (six) hours  as needed. 120 tablet 0  . potassium chloride (KLOR-CON) 20 MEQ packet Take 20 mEq by mouth daily.    . SUMAtriptan (IMITREX) 100 MG tablet Take 1 tablet earliest onset of headache.  May repeat once after 2 hours if headache persists or recurs.  Do not exceed 2 tablets in 24 hrs 10 tablet 12   No current facility-administered medications on file prior to visit.     No complications with anesthesia - blood pressure too high  Call my supervising physician  [facility] appointments   cardura 8 mg   BP Readings from Last 3 Encounters:  10/22/17 (!) 153/98  10/21/17 (!) 147/113  10/21/17 (!) 138/106     Social History   Tobacco Use  . Smoking status: Current Every Day  Smoker    Packs/day: 1.00    Years: 35.00    Pack years: 35.00    Types: Cigarettes  . Smokeless tobacco: Never Used  Substance Use Topics  . Alcohol use: No    Alcohol/week: 0.0 standard drinks  . Drug use: No    Review of Systems Per HPI unless specifically indicated above     Objective:    BP (!) 153/98   Pulse 77   Wt 230 lb (104.3 kg)   SpO2 98%   BMI 33.00 kg/m   Wt Readings from Last 3 Encounters:  10/22/17 230 lb (104.3 kg)  10/21/17 227 lb 8 oz (103.2 kg)  10/21/17 230 lb (104.3 kg)    Physical Exam  Constitutional: He is oriented to person, place, and time. He appears well-developed and well-nourished.  Cardiovascular: Normal rate and regular rhythm.  Pulmonary/Chest: Effort normal and breath sounds normal.  Neurological: He is alert and oriented to person, place, and time.  Skin: Skin is warm and dry.  Psychiatric: He has a normal mood and affect. His behavior is normal.   Results for orders placed or performed during the hospital encounter of 10/21/17  Surgical pcr screen  Result Value Ref Range   MRSA, PCR NEGATIVE NEGATIVE   Staphylococcus aureus NEGATIVE NEGATIVE  Urine culture  Result Value Ref Range   Specimen Description      URINE, RANDOM Performed at [facility], [address], [place], [place] [address]    Special Requests      NONE Performed at [facility], [address], [place], [place] [address]    Culture      NO GROWTH Performed at [facility], [place] [address] [place], [place] [address]    Report Status 10/22/2017 FINAL   CBC  Result Value Ref Range   WBC 7.3 3.8 - 10.6 K/uL   RBC 4.88 4.40 - 5.90 MIL/uL   Hemoglobin 15.0 13.0 - 18.0 g/dL   HCT 43.2 40.0 - 52.0 %   MCV 88.5 80.0 - 100.0 fL   MCH 30.7 26.0 - 34.0 pg   MCHC 34.7 32.0 - 36.0 g/dL   RDW 13.7 11.5 - 14.5 %   Platelets 187 150 - 440 K/uL  Basic metabolic panel  Result Value Ref Range   Sodium 136 135 - 145 mmol/L   Potassium 3.2  (L) 3.5 - 5.1 mmol/L   Chloride 98 98 - 111 mmol/L   CO2 30 22 - 32 mmol/L   Glucose, Bld 184 (H) 70 - 99 mg/dL   BUN 22 (H) 6 - 20 mg/dL   Creatinine, Ser 1.09 0.61 - 1.24 mg/dL   Calcium 9.5 8.9 - 10.3  mg/dL   GFR calc non Af Amer >60 >60 mL/min   GFR calc Af Amer >60 >60 mL/min   Anion gap 8 5 - 15  Protime-INR  Result Value Ref Range   Prothrombin Time 13.7 11.4 - 15.2 seconds   INR 1.06   APTT  Result Value Ref Range   aPTT 25 24 - 36 seconds  Urinalysis, Routine w reflex microscopic  Result Value Ref Range   Color, Urine AMBER (A) YELLOW   APPearance TURBID (A) CLEAR   Specific Gravity, Urine 1.025 1.005 - 1.030   pH 5.0 5.0 - 8.0   Glucose, UA >=500 (A) NEGATIVE mg/dL   Hgb urine dipstick NEGATIVE NEGATIVE   Bilirubin Urine NEGATIVE NEGATIVE   Ketones, ur NEGATIVE NEGATIVE mg/dL   Protein, ur 30 (A) NEGATIVE mg/dL   Nitrite NEGATIVE NEGATIVE   Leukocytes, UA NEGATIVE NEGATIVE   RBC / HPF 0-5 0 - 5 RBC/hpf   WBC, UA 0-5 0 - 5 WBC/hpf   Bacteria, UA NONE SEEN NONE SEEN   Squamous Epithelial / LPF 0-5 0 - 5   Mucus PRESENT    Amorphous Crystal PRESENT   Sedimentation rate  Result Value Ref Range   Sed Rate 11 0 - 20 mm/hr  Type and screen [facility]  Result Value Ref Range   ABO/RH(D) A POS    Antibody Screen NEG    Sample Expiration 11/04/2017    Extend sample reason      NO TRANSFUSIONS OR PREGNANCY IN THE PAST 3 MONTHS Performed at [facility], [address] [place], [place] [address]       Assessment & Plan:  1. Malignant hypertension  Uncontrolled on SIX medications. Cannot clear this patient for surgery and I have faxed denial to his orthopedic surgeon. Very concerned with uncontrolled HTN, attacks of palpitations and sweating, and elevated urine metanephrine that he has issue with adrenal glands, possible pheochromocytoma. Increase cardura to 8 mg.   - CT ABDOMEN PELVIS W WO CONTRAST; Future  2. Palpitations  -  CT ABDOMEN PELVIS W WO CONTRAST; Future    Follow up plan: Return in about 2 weeks (around 11/05/2017).  Carles Collet, PA-C [facility] 10/23/2017, 4:19 PM

## 2017-10-22 NOTE — Telephone Encounter (Signed)
[facility] faxed office notes on 10-21-17. I called to ask if they received the notes, and if that is sufficient for what they need. Message left.

## 2017-10-22 NOTE — Telephone Encounter (Signed)
Pharmacy called wanting to confirm that you received the letter of medical necessity to fill out for the patients compounding medicine. Please call with questions. [phone]

## 2017-10-23 ENCOUNTER — Telehealth: Payer: Self-pay | Admitting: Physician Assistant

## 2017-10-23 ENCOUNTER — Other Ambulatory Visit: Payer: Self-pay | Admitting: Physician Assistant

## 2017-10-23 DIAGNOSIS — I10 Essential (primary) hypertension: Secondary | ICD-10-CM

## 2017-10-23 NOTE — Telephone Encounter (Signed)
Called patient today about his surgical clearance. I have told him I cannot clear him for his surgery on 10/28/2017 for a hip replacement due to his uncontrolled HTN. He says his blood pressure when he wakes up is 137/97 and it only increases when he has to move because of pain. He is currently on SIX blood pressure medications without control and also elevated urine metanephrines with described attacks of palpitations, sweating which are concerning for pheochromocytoma and other secondary causes of HTN. I have told patient we need to undergo further work up for this and he says that this is "just great" that he has wasted all of his money on the surgeon to evaluate him and now he can't get the surgery. He says "Thanks, I'm done" and hangs up the phone. He has an appointment scheduled with nephrology on 11/20/2017 and I wanted him to see endocrinology but he is unwilling. I also instructed him to increase cardura to 8 mg but I am not sure if he is doing this. Offered follow up visit in clinic in two weeks which he did not respond do.

## 2017-10-23 NOTE — Patient Instructions (Signed)

## 2017-10-28 ENCOUNTER — Inpatient Hospital Stay: Admission: RE | Admit: 2017-10-28 | Payer: Medicare Other | Source: Ambulatory Visit | Admitting: Orthopedic Surgery

## 2017-10-28 ENCOUNTER — Encounter: Admission: RE | Payer: Self-pay | Source: Ambulatory Visit

## 2017-10-28 SURGERY — ARTHROPLASTY, HIP, TOTAL, ANTERIOR APPROACH
Anesthesia: Choice | Laterality: Left

## 2017-10-30 ENCOUNTER — Ambulatory Visit: Payer: Self-pay

## 2017-10-30 ENCOUNTER — Ambulatory Visit
Admission: RE | Admit: 2017-10-30 | Discharge: 2017-10-30 | Disposition: A | Discharge status: Home / Self Care | Payer: Medicare Other | Source: Ambulatory Visit | Attending: Physician Assistant | Admitting: Physician Assistant

## 2017-10-30 DIAGNOSIS — I700 Atherosclerosis of aorta: Secondary | ICD-10-CM | POA: Diagnosis not present

## 2017-10-30 DIAGNOSIS — K760 Fatty (change of) liver, not elsewhere classified: Secondary | ICD-10-CM | POA: Diagnosis not present

## 2017-10-30 DIAGNOSIS — I10 Essential (primary) hypertension: Secondary | ICD-10-CM | POA: Insufficient documentation

## 2017-10-30 MED ORDER — IOPAMIDOL (ISOVUE-300) INJECTION 61%
100.0000 mL | Freq: Once | INTRAVENOUS | Status: AC | PRN
  Administered 2017-10-30: 100 mL via INTRAVENOUS

## 2017-10-31 ENCOUNTER — Other Ambulatory Visit: Payer: Self-pay

## 2017-10-31 DIAGNOSIS — I10 Essential (primary) hypertension: Secondary | ICD-10-CM

## 2017-11-04 ENCOUNTER — Telehealth: Payer: Self-pay

## 2017-11-04 ENCOUNTER — Telehealth: Payer: Self-pay | Admitting: *Deleted

## 2017-11-04 NOTE — Telephone Encounter (Signed)
Please let him know I'll just see him at his follow up on the 9th- we won't do anything else until I see him.

## 2017-11-04 NOTE — Telephone Encounter (Signed)
Results read to patient; verbalizes understanding. Pt would like a CB from Dr. Wynetta Emery to discuss plan moving forward.  States he "Cannot afford all these appts and referrals and scans".  Please advise.  Telephone encounter as result note was not routed to [facility].

## 2017-11-04 NOTE — Telephone Encounter (Signed)
His Ct did not show adrenal mass, please see result note. He will need renal ultrasound before going to nephrology and he is following up with Dr. Wynetta Emery in clinic.

## 2017-11-04 NOTE — Telephone Encounter (Signed)
Left message on machine for pt to return call to the office. CRM updated.   Per result note on CT,    "Notes recorded by Georgina Peer, CMA on 10/31/2017 at 11:17 AM EDT Spoke with Keri. She is working on the Renal US. ------  Notes recorded by Trinna Post, PA-C on 10/31/2017 at 11:05 AM EDT His CT abdomen pelvis did not show any growths on adrenal glands. Does have some hardening of arteries and fatty livers. Renal ultrasound was ordered but not scheduled, uncertain as to why. If we could get this scheduled for him and he can follow up with Dr. Wynetta Emery on 11/10/2017 in clinic. He also has appointment with nephrology on 11/20/2017 that he should keep as of right now."

## 2017-11-04 NOTE — Telephone Encounter (Signed)
Copied from [place]. Topic: General - Other >> Nov 04, 2017  8:25 AM Alfredia Ferguson R wrote: Pt is calling in requesting his CT results   Routing to provider for CT results.

## 2017-11-04 NOTE — Telephone Encounter (Signed)
Returned patient call.  Patient states that everything had been taken care of and there was not further action needed.

## 2017-11-06 NOTE — Telephone Encounter (Signed)
Please see prior message.   

## 2017-11-10 ENCOUNTER — Encounter: Payer: Self-pay | Admitting: Family Medicine

## 2017-11-10 ENCOUNTER — Ambulatory Visit (INDEPENDENT_AMBULATORY_CARE_PROVIDER_SITE_OTHER): Payer: Medicare Other | Admitting: Family Medicine

## 2017-11-10 VITALS — BP 159/100 | HR 75 | Temp 98.4°F | Wt 233.1 lb

## 2017-11-10 DIAGNOSIS — I10 Essential (primary) hypertension: Secondary | ICD-10-CM | POA: Diagnosis not present

## 2017-11-10 MED ORDER — HYDRALAZINE HCL 25 MG PO TABS: 25.0000 mg | ORAL_TABLET | Freq: Three times a day (TID) | ORAL | 3 refills | Status: DC

## 2017-11-10 NOTE — Assessment & Plan Note (Signed)
Not under good control. Will increase his hydralazine to 25mg  TID and recheck 2 weeks. Call with any concerns.

## 2017-11-10 NOTE — Progress Notes (Signed)
BP (!) 159/100 (BP Location: Right Arm, Cuff Size: Large)   Pulse 75   Temp 98.4 F (36.9 C)   Wt 233 lb 2 oz (105.7 kg)   SpO2 98%   BMI 33.45 kg/m    Subjective:    Patient ID: Kristopher [name], male    DOB: 1961/02/03, 57 y.o.   MRN: [id number]  HPI: Kristopher [name] is a 57 y.o. male  Chief Complaint  Patient presents with  . Hypertension   HYPERTENSION Hypertension status: uncontrolled  Satisfied with current treatment? no Duration of hypertension: chronic BP monitoring frequency:  not checking BP medication side effects:  no Medication compliance: good compliance Previous BP meds: metoprolol, hydralazine, HCTZ, cardura, amlodipine-valsartan Aspirin: no Recurrent headaches: yes Visual changes: yes Palpitations: no Dyspnea: no Chest pain: no Lower extremity edema: no Dizzy/lightheaded: no  Relevant past medical, surgical, family and social history reviewed and updated as indicated. Interim medical history since our last visit reviewed. Allergies and medications reviewed and updated.  Review of Systems  Constitutional: Negative.   Respiratory: Negative.   Cardiovascular: Negative.   Musculoskeletal: Positive for arthralgias, myalgias, neck pain and neck stiffness. Negative for back pain, gait problem and joint swelling.  Skin: Negative.   Neurological: Negative.   Psychiatric/Behavioral: Negative.     Per HPI unless specifically indicated above     Objective:    BP (!) 159/100 (BP Location: Right Arm, Cuff Size: Large)   Pulse 75   Temp 98.4 F (36.9 C)   Wt 233 lb 2 oz (105.7 kg)   SpO2 98%   BMI 33.45 kg/m   Wt Readings from Last 3 Encounters:  11/10/17 233 lb 2 oz (105.7 kg)  10/22/17 230 lb (104.3 kg)  10/21/17 227 lb 8 oz (103.2 kg)    Physical Exam  Constitutional: He is oriented to person, place, and time. He appears well-developed and well-nourished. No distress.  HENT:  Head: Normocephalic and atraumatic.  Right Ear: Hearing  normal.  Left Ear: Hearing normal.  Nose: Nose normal.  Eyes: Conjunctivae and lids are normal. Right eye exhibits no discharge. Left eye exhibits no discharge. No scleral icterus.  Cardiovascular: Normal rate, regular rhythm, normal heart sounds and intact distal pulses. Exam reveals no gallop and no friction rub.  No murmur heard. Pulmonary/Chest: Effort normal and breath sounds normal. No stridor. No respiratory distress. He has no wheezes. He has no rales. He exhibits no tenderness.  Musculoskeletal: Normal range of motion.  Neurological: He is alert and oriented to person, place, and time.  Skin: Skin is warm, dry and intact. Capillary refill takes less than 2 seconds. No rash noted. No erythema. No pallor.  Psychiatric: He has a normal mood and affect. His speech is normal and behavior is normal. Judgment and thought content normal. Cognition and memory are normal.  Nursing note and vitals reviewed.   Results for orders placed or performed during the hospital encounter of 10/21/17  Surgical pcr screen  Result Value Ref Range   MRSA, PCR NEGATIVE NEGATIVE   Staphylococcus aureus NEGATIVE NEGATIVE  Urine culture  Result Value Ref Range   Specimen Description      URINE, RANDOM Performed at [facility], [address] [place], [place] [address]    Special Requests      NONE Performed at [facility], [address], [place], [place] [address]    Culture      NO GROWTH Performed at [facility], [address] [place], [place] [address]    Report Status 10/22/2017 FINAL   CBC  Result Value Ref Range   WBC 7.3 3.8 - 10.6 K/uL   RBC 4.88 4.40 - 5.90 MIL/uL   Hemoglobin 15.0 13.0 - 18.0 g/dL   HCT 43.2 40.0 - 52.0 %   MCV 88.5 80.0 - 100.0 fL   MCH 30.7 26.0 - 34.0 pg   MCHC 34.7 32.0 - 36.0 g/dL   RDW 13.7 11.5 - 14.5 %   Platelets 187 150 - 440 K/uL  Basic metabolic panel  Result Value Ref Range   Sodium 136 135 - 145 mmol/L   Potassium  3.2 (L) 3.5 - 5.1 mmol/L   Chloride 98 98 - 111 mmol/L   CO2 30 22 - 32 mmol/L   Glucose, Bld 184 (H) 70 - 99 mg/dL   BUN 22 (H) 6 - 20 mg/dL   Creatinine, Ser 1.09 0.61 - 1.24 mg/dL   Calcium 9.5 8.9 - 10.3 mg/dL   GFR calc non Af Amer >60 >60 mL/min   GFR calc Af Amer >60 >60 mL/min   Anion gap 8 5 - 15  Protime-INR  Result Value Ref Range   Prothrombin Time 13.7 11.4 - 15.2 seconds   INR 1.06   APTT  Result Value Ref Range   aPTT 25 24 - 36 seconds  Urinalysis, Routine w reflex microscopic  Result Value Ref Range   Color, Urine AMBER (A) YELLOW   APPearance TURBID (A) CLEAR   Specific Gravity, Urine 1.025 1.005 - 1.030   pH 5.0 5.0 - 8.0   Glucose, UA >=500 (A) NEGATIVE mg/dL   Hgb urine dipstick NEGATIVE NEGATIVE   Bilirubin Urine NEGATIVE NEGATIVE   Ketones, ur NEGATIVE NEGATIVE mg/dL   Protein, ur 30 (A) NEGATIVE mg/dL   Nitrite NEGATIVE NEGATIVE   Leukocytes, UA NEGATIVE NEGATIVE   RBC / HPF 0-5 0 - 5 RBC/hpf   WBC, UA 0-5 0 - 5 WBC/hpf   Bacteria, UA NONE SEEN NONE SEEN   Squamous Epithelial / LPF 0-5 0 - 5   Mucus PRESENT    Amorphous Crystal PRESENT   Sedimentation rate  Result Value Ref Range   Sed Rate 11 0 - 20 mm/hr  Type and screen [place]  Result Value Ref Range   ABO/RH(D) A POS    Antibody Screen NEG    Sample Expiration 11/04/2017    Extend sample reason      NO TRANSFUSIONS OR PREGNANCY IN THE PAST 3 MONTHS Performed at [facility], [address] [place], [place] [address]       Assessment & Plan:   Problem List Items Addressed This Visit      Cardiovascular and Mediastinum   HTN (hypertension) - Primary    Not under good control. Will increase his hydralazine to 25mg  TID and recheck 2 weeks. Call with any concerns.       Relevant Medications   hydrALAZINE (APRESOLINE) 25 MG tablet       Follow up plan: Return in about 2 weeks (around 11/24/2017) for BP follow up.

## 2017-11-25 ENCOUNTER — Other Ambulatory Visit: Payer: Self-pay

## 2017-11-25 ENCOUNTER — Ambulatory Visit (INDEPENDENT_AMBULATORY_CARE_PROVIDER_SITE_OTHER): Payer: Medicare Other | Admitting: Family Medicine

## 2017-11-25 ENCOUNTER — Encounter: Payer: Self-pay | Admitting: Family Medicine

## 2017-11-25 VITALS — BP 168/118 | HR 89 | Temp 98.1°F | Ht 70.0 in | Wt 233.0 lb

## 2017-11-25 DIAGNOSIS — I129 Hypertensive chronic kidney disease with stage 1 through stage 4 chronic kidney disease, or unspecified chronic kidney disease: Secondary | ICD-10-CM | POA: Diagnosis not present

## 2017-11-25 DIAGNOSIS — M25552 Pain in left hip: Secondary | ICD-10-CM | POA: Diagnosis not present

## 2017-11-25 MED ORDER — HYDRALAZINE HCL 50 MG PO TABS: 50.0000 mg | ORAL_TABLET | Freq: Three times a day (TID) | ORAL | 3 refills | Status: DC

## 2017-11-25 NOTE — Assessment & Plan Note (Signed)
Still not under good control. He states that this is due to pain. Will increase hydralazine to 50mg . To see cardiology to see if they can give him clearance for surgery and if they can help get his BP under better control. Appointment scheduled for Thursday. Call with any concerns. Recheck 1 month.

## 2017-11-25 NOTE — Progress Notes (Signed)
BP (!) 168/118 (BP Location: Left Arm, Cuff Size: Normal)   Pulse 89   Temp 98.1 F (36.7 C) (Tympanic)   Ht 5\' 10"  (1.778 m)   Wt 233 lb (105.7 kg)   SpO2 98%   BMI 33.43 kg/m    Subjective:    Patient ID: Kristopher [name], male    DOB: 1960-09-13, 57 y.o.   MRN: [id number]  HPI: Kristopher [name] is a 57 y.o. male  Chief Complaint  Patient presents with  . Hypertension    f/u   HYPERTENSION Hypertension status: uncontrolled  Satisfied with current treatment? yes Duration of hypertension: chronic BP monitoring frequency:  daily BP range: 139/98 BP medication side effects:  no Medication compliance: excellent compliance Previous BP meds: amlodipine-valsartan, cardura, hydralazine, HCTZ, metoprolol Aspirin: no Recurrent headaches: no Visual changes: no Palpitations: no Dyspnea: no Chest pain: no Lower extremity edema: no Dizzy/lightheaded: no  Relevant past medical, surgical, family and social history reviewed and updated as indicated. Interim medical history since our last visit reviewed. Allergies and medications reviewed and updated.  Review of Systems  Constitutional: Negative.   Respiratory: Negative.   Cardiovascular: Negative.   Musculoskeletal: Positive for arthralgias and myalgias. Negative for back pain, gait problem, joint swelling, neck pain and neck stiffness.  Skin: Negative.   Neurological: Negative.   Psychiatric/Behavioral: Negative.     Per HPI unless specifically indicated above     Objective:    BP (!) 168/118 (BP Location: Left Arm, Cuff Size: Normal)   Pulse 89   Temp 98.1 F (36.7 C) (Tympanic)   Ht 5\' 10"  (1.778 m)   Wt 233 lb (105.7 kg)   SpO2 98%   BMI 33.43 kg/m   Wt Readings from Last 3 Encounters:  11/25/17 233 lb (105.7 kg)  11/10/17 233 lb 2 oz (105.7 kg)  10/22/17 230 lb (104.3 kg)    Physical Exam  Constitutional: He is oriented to person, place, and time. He appears well-developed and well-nourished. No  distress.  HENT:  Head: Normocephalic and atraumatic.  Right Ear: Hearing normal.  Left Ear: Hearing normal.  Nose: Nose normal.  Eyes: Conjunctivae and lids are normal. Right eye exhibits no discharge. Left eye exhibits no discharge. No scleral icterus.  Cardiovascular: Normal rate, regular rhythm, normal heart sounds and intact distal pulses. Exam reveals no gallop and no friction rub.  No murmur heard. Pulmonary/Chest: Effort normal and breath sounds normal. No stridor. No respiratory distress. He has no wheezes. He has no rales. He exhibits no tenderness.  Musculoskeletal: Normal range of motion.  Neurological: He is alert and oriented to person, place, and time.  Skin: Skin is warm, dry and intact. Capillary refill takes less than 2 seconds. No rash noted. He is not diaphoretic. No erythema. No pallor.  Psychiatric: He has a normal mood and affect. His speech is normal and behavior is normal. Judgment and thought content normal. Cognition and memory are normal.  Nursing note and vitals reviewed.   Results for orders placed or performed during the hospital encounter of 10/21/17  Surgical pcr screen  Result Value Ref Range   MRSA, PCR NEGATIVE NEGATIVE   Staphylococcus aureus NEGATIVE NEGATIVE  Urine culture  Result Value Ref Range   Specimen Description      URINE, RANDOM Performed at [facility], [address] [place], [place] [address]    Special Requests      NONE Performed at [facility], [place] [place],  [place] [address]    Culture      NO GROWTH Performed at [facility], [place] [address] [place], [place] [address]    Report Status 10/22/2017 FINAL   CBC  Result Value Ref Range   WBC 7.3 3.8 - 10.6 K/uL   RBC 4.88 4.40 - 5.90 MIL/uL   Hemoglobin 15.0 13.0 - 18.0 g/dL   HCT 43.2 40.0 - 52.0 %   MCV 88.5 80.0 - 100.0 fL   MCH 30.7 26.0 - 34.0 pg   MCHC 34.7 32.0 - 36.0 g/dL   RDW 13.7 11.5 - 14.5 %   Platelets 187 150 - 440  K/uL  Basic metabolic panel  Result Value Ref Range   Sodium 136 135 - 145 mmol/L   Potassium 3.2 (L) 3.5 - 5.1 mmol/L   Chloride 98 98 - 111 mmol/L   CO2 30 22 - 32 mmol/L   Glucose, Bld 184 (H) 70 - 99 mg/dL   BUN 22 (H) 6 - 20 mg/dL   Creatinine, Ser 1.09 0.61 - 1.24 mg/dL   Calcium 9.5 8.9 - 10.3 mg/dL   GFR calc non Af Amer >60 >60 mL/min   GFR calc Af Amer >60 >60 mL/min   Anion gap 8 5 - 15  Protime-INR  Result Value Ref Range   Prothrombin Time 13.7 11.4 - 15.2 seconds   INR 1.06   APTT  Result Value Ref Range   aPTT 25 24 - 36 seconds  Urinalysis, Routine w reflex microscopic  Result Value Ref Range   Color, Urine AMBER (A) YELLOW   APPearance TURBID (A) CLEAR   Specific Gravity, Urine 1.025 1.005 - 1.030   pH 5.0 5.0 - 8.0   Glucose, UA >=500 (A) NEGATIVE mg/dL   Hgb urine dipstick NEGATIVE NEGATIVE   Bilirubin Urine NEGATIVE NEGATIVE   Ketones, ur NEGATIVE NEGATIVE mg/dL   Protein, ur 30 (A) NEGATIVE mg/dL   Nitrite NEGATIVE NEGATIVE   Leukocytes, UA NEGATIVE NEGATIVE   RBC / HPF 0-5 0 - 5 RBC/hpf   WBC, UA 0-5 0 - 5 WBC/hpf   Bacteria, UA NONE SEEN NONE SEEN   Squamous Epithelial / LPF 0-5 0 - 5   Mucus PRESENT    Amorphous Crystal PRESENT   Sedimentation rate  Result Value Ref Range   Sed Rate 11 0 - 20 mm/hr  Type and screen [place]  Result Value Ref Range   ABO/RH(D) A POS    Antibody Screen NEG    Sample Expiration 11/04/2017    Extend sample reason      NO TRANSFUSIONS OR PREGNANCY IN THE PAST 3 MONTHS Performed at [facility], [address] [place], [place] [address]       Assessment & Plan:   Problem List Items Addressed This Visit      Genitourinary   Benign hypertensive renal disease - Primary    Still not under good control. He states that this is due to pain. Will increase hydralazine to 50mg . To see cardiology to see if they can give him clearance for surgery and if they can help get his BP under  better control. Appointment scheduled for Thursday. Call with any concerns. Recheck 1 month.       Relevant Medications   hydrALAZINE (APRESOLINE) 50 MG tablet   Other Relevant Orders   Ambulatory referral to Cardiology    Other Visit Diagnoses    Left hip pain  Needs have surgery- but BP running too high. To see cardiology to see if they can help.       Follow up plan: Return in about 4 weeks (around 12/23/2017).

## 2017-11-25 NOTE — Patient Instructions (Addendum)
Thursday September 26, 11:30AM Dr. Nehemiah Massed [address] [place], [place] [address]  Phone: [phone]   Lauris Poag, [facility]  [facility] [place], [place] [address]  [phone]  [phone] (Fax)

## 2017-11-27 ENCOUNTER — Emergency Department: Admission: EM | Admit: 2017-11-27 | Payer: Medicare Other | Source: Home / Self Care

## 2017-11-27 DIAGNOSIS — I129 Hypertensive chronic kidney disease with stage 1 through stage 4 chronic kidney disease, or unspecified chronic kidney disease: Secondary | ICD-10-CM | POA: Diagnosis not present

## 2017-11-27 DIAGNOSIS — Z01810 Encounter for preprocedural cardiovascular examination: Secondary | ICD-10-CM | POA: Diagnosis not present

## 2017-12-01 ENCOUNTER — Telehealth: Payer: Self-pay

## 2017-12-01 NOTE — Telephone Encounter (Signed)
Pt called and is having hip surgery and wants to know if you can write him 2 or 3 scrip's for the medical cream so he can stay ahead, he is going down stairs for pre surgery this morning and can pick then up.

## 2017-12-01 NOTE — Telephone Encounter (Signed)
Left message,  Patient notified that no scripts would be called in that he needed to make an appointment for scripts.

## 2017-12-01 NOTE — Telephone Encounter (Signed)
No Rxs will be given out without an apt.

## 2017-12-01 NOTE — Telephone Encounter (Signed)
No

## 2017-12-02 ENCOUNTER — Inpatient Hospital Stay: Payer: 59 | Admitting: Anesthesiology

## 2017-12-02 ENCOUNTER — Inpatient Hospital Stay: Payer: 59

## 2017-12-02 ENCOUNTER — Inpatient Hospital Stay
Admission: RE | Admit: 2017-12-02 | Discharge: 2017-12-04 | DRG: 470 | Disposition: A | Discharge status: Home Health | Payer: 59 | Attending: Orthopedic Surgery | Admitting: Orthopedic Surgery

## 2017-12-02 ENCOUNTER — Other Ambulatory Visit: Payer: Self-pay

## 2017-12-02 ENCOUNTER — Encounter
Admission: RE | Disposition: A | Discharge status: Home Health | Payer: Self-pay | Source: Home / Self Care | Attending: Orthopedic Surgery

## 2017-12-02 DIAGNOSIS — Z79891 Long term (current) use of opiate analgesic: Secondary | ICD-10-CM | POA: Diagnosis not present

## 2017-12-02 DIAGNOSIS — M1612 Unilateral primary osteoarthritis, left hip: Principal | ICD-10-CM | POA: Diagnosis present

## 2017-12-02 DIAGNOSIS — G8918 Other acute postprocedural pain: Secondary | ICD-10-CM

## 2017-12-02 DIAGNOSIS — Z981 Arthrodesis status: Secondary | ICD-10-CM

## 2017-12-02 DIAGNOSIS — Z91013 Allergy to seafood: Secondary | ICD-10-CM | POA: Diagnosis not present

## 2017-12-02 DIAGNOSIS — Z79899 Other long term (current) drug therapy: Secondary | ICD-10-CM | POA: Diagnosis not present

## 2017-12-02 DIAGNOSIS — G894 Chronic pain syndrome: Secondary | ICD-10-CM | POA: Diagnosis not present

## 2017-12-02 DIAGNOSIS — Z791 Long term (current) use of non-steroidal anti-inflammatories (NSAID): Secondary | ICD-10-CM | POA: Diagnosis not present

## 2017-12-02 DIAGNOSIS — Z419 Encounter for procedure for purposes other than remedying health state, unspecified: Secondary | ICD-10-CM

## 2017-12-02 DIAGNOSIS — Z888 Allergy status to other drugs, medicaments and biological substances status: Secondary | ICD-10-CM | POA: Diagnosis not present

## 2017-12-02 DIAGNOSIS — I129 Hypertensive chronic kidney disease with stage 1 through stage 4 chronic kidney disease, or unspecified chronic kidney disease: Secondary | ICD-10-CM | POA: Diagnosis not present

## 2017-12-02 DIAGNOSIS — Z91018 Allergy to other foods: Secondary | ICD-10-CM | POA: Diagnosis not present

## 2017-12-02 DIAGNOSIS — F17200 Nicotine dependence, unspecified, uncomplicated: Secondary | ICD-10-CM | POA: Diagnosis present

## 2017-12-02 DIAGNOSIS — Z880 Allergy status to penicillin: Secondary | ICD-10-CM | POA: Diagnosis not present

## 2017-12-02 DIAGNOSIS — K219 Gastro-esophageal reflux disease without esophagitis: Secondary | ICD-10-CM | POA: Diagnosis present

## 2017-12-02 DIAGNOSIS — Z96642 Presence of left artificial hip joint: Secondary | ICD-10-CM | POA: Diagnosis not present

## 2017-12-02 DIAGNOSIS — N189 Chronic kidney disease, unspecified: Secondary | ICD-10-CM | POA: Diagnosis present

## 2017-12-02 DIAGNOSIS — Z96649 Presence of unspecified artificial hip joint: Secondary | ICD-10-CM

## 2017-12-02 DIAGNOSIS — Z886 Allergy status to analgesic agent status: Secondary | ICD-10-CM

## 2017-12-02 DIAGNOSIS — Z471 Aftercare following joint replacement surgery: Secondary | ICD-10-CM | POA: Diagnosis not present

## 2017-12-02 HISTORY — PX: JOINT REPLACEMENT: SHX530

## 2017-12-02 HISTORY — PX: TOTAL HIP ARTHROPLASTY: SHX124

## 2017-12-02 LAB — CBC
HCT: 40.6 % (ref 40.0–52.0)
Hemoglobin: 14.1 g/dL (ref 13.0–18.0)
MCH: 30.8 pg (ref 26.0–34.0)
MCHC: 34.7 g/dL (ref 32.0–36.0)
MCV: 88.8 fL (ref 80.0–100.0)
Platelets: 160 10*3/uL (ref 150–440)
RBC: 4.57 MIL/uL (ref 4.40–5.90)
RDW: 13.8 % (ref 11.5–14.5)
WBC: 6.3 10*3/uL (ref 3.8–10.6)

## 2017-12-02 LAB — TYPE AND SCREEN
ABO/RH(D): A POS
Antibody Screen: NEGATIVE

## 2017-12-02 LAB — CREATININE, SERUM
Creatinine, Ser: 0.8 mg/dL (ref 0.61–1.24)
GFR calc Af Amer: >60 mL/min (ref >60)
GFR calc non Af Amer: >60 mL/min (ref >60)

## 2017-12-02 SURGERY — ARTHROPLASTY, HIP, TOTAL, ANTERIOR APPROACH
Anesthesia: General | Laterality: Left

## 2017-12-02 MED ORDER — FENTANYL CITRATE (PF) 100 MCG/2ML IJ SOLN
INTRAMUSCULAR | Status: AC
  Filled 2017-12-02: qty 2

## 2017-12-02 MED ORDER — FENTANYL CITRATE (PF) 100 MCG/2ML IJ SOLN
INTRAMUSCULAR | Status: AC
  Administered 2017-12-02: 25 ug via INTRAVENOUS
  Filled 2017-12-02: qty 2

## 2017-12-02 MED ORDER — AMLODIPINE BESYLATE 10 MG PO TABS
10.0000 mg | ORAL_TABLET | Freq: Once | ORAL | Status: AC
  Administered 2017-12-02: 10 mg via ORAL
  Filled 2017-12-02: qty 1

## 2017-12-02 MED ORDER — FENTANYL CITRATE (PF) 100 MCG/2ML IJ SOLN
INTRAMUSCULAR | Status: DC | PRN
  Administered 2017-12-02: 50 ug via INTRAVENOUS
  Administered 2017-12-02: 100 ug via INTRAVENOUS
  Administered 2017-12-02 (×2): 50 ug via INTRAVENOUS
  Administered 2017-12-02: 100 ug via INTRAVENOUS

## 2017-12-02 MED ORDER — DIPHENHYDRAMINE HCL 12.5 MG/5ML PO ELIX
12.5000 mg | ORAL_SOLUTION | ORAL | Status: DC | PRN
  Administered 2017-12-02: 25 mg via ORAL
  Administered 2017-12-03: 12.5 mg via ORAL
  Filled 2017-12-02: qty 10
  Filled 2017-12-02: qty 5

## 2017-12-02 MED ORDER — METOPROLOL SUCCINATE ER 50 MG PO TB24
100.0000 mg | ORAL_TABLET | Freq: Every day | ORAL | Status: DC
  Administered 2017-12-02 – 2017-12-03 (×2): 100 mg via ORAL
  Filled 2017-12-02 (×2): qty 2

## 2017-12-02 MED ORDER — AMLODIPINE BESYLATE-VALSARTAN 10-320 MG PO TABS: 1.0000 | ORAL_TABLET | Freq: Every day | ORAL | Status: DC

## 2017-12-02 MED ORDER — DOXAZOSIN MESYLATE 4 MG PO TABS
4.0000 mg | ORAL_TABLET | Freq: Once | ORAL | Status: AC
  Administered 2017-12-02: 4 mg via ORAL
  Filled 2017-12-02: qty 1

## 2017-12-02 MED ORDER — AMLODIPINE BESYLATE 10 MG PO TABS
10.0000 mg | ORAL_TABLET | Freq: Every day | ORAL | Status: DC
  Administered 2017-12-02 – 2017-12-04 (×2): 10 mg via ORAL
  Filled 2017-12-02 (×3): qty 1

## 2017-12-02 MED ORDER — FENTANYL CITRATE (PF) 250 MCG/5ML IJ SOLN
INTRAMUSCULAR | Status: AC
  Filled 2017-12-02: qty 5

## 2017-12-02 MED ORDER — POTASSIUM CHLORIDE 20 MEQ PO PACK
20.0000 meq | PACK | Freq: Every day | ORAL | Status: DC
  Administered 2017-12-02 – 2017-12-03 (×2): 20 meq via ORAL
  Filled 2017-12-02 (×3): qty 1

## 2017-12-02 MED ORDER — MENTHOL 3 MG MT LOZG
1.0000 | LOZENGE | OROMUCOSAL | Status: DC | PRN
  Filled 2017-12-02: qty 9

## 2017-12-02 MED ORDER — DOXAZOSIN MESYLATE 4 MG PO TABS
4.0000 mg | ORAL_TABLET | Freq: Every day | ORAL | Status: DC
  Administered 2017-12-02: 4 mg via ORAL
  Filled 2017-12-02 (×3): qty 1

## 2017-12-02 MED ORDER — HYDROMORPHONE HCL 1 MG/ML IJ SOLN
INTRAMUSCULAR | Status: AC
  Administered 2017-12-02: 0.25 mg via INTRAVENOUS
  Filled 2017-12-02: qty 1

## 2017-12-02 MED ORDER — TRAMADOL HCL 50 MG PO TABS
50.0000 mg | ORAL_TABLET | Freq: Four times a day (QID) | ORAL | Status: DC
  Administered 2017-12-02 – 2017-12-04 (×8): 50 mg via ORAL
  Filled 2017-12-02 (×8): qty 1

## 2017-12-02 MED ORDER — LACTATED RINGERS IV SOLN
INTRAVENOUS | Status: DC | PRN
  Administered 2017-12-02: 10:00:00 via INTRAVENOUS

## 2017-12-02 MED ORDER — SODIUM CHLORIDE 0.9 % IV SOLN
INTRAVENOUS | Status: DC
  Administered 2017-12-02 – 2017-12-03 (×2): via INTRAVENOUS

## 2017-12-02 MED ORDER — SUGAMMADEX SODIUM 200 MG/2ML IV SOLN
INTRAVENOUS | Status: DC | PRN
  Administered 2017-12-02: 216 mg via INTRAVENOUS

## 2017-12-02 MED ORDER — ONDANSETRON HCL 4 MG/2ML IJ SOLN
INTRAMUSCULAR | Status: DC | PRN
  Administered 2017-12-02: 4 mg via INTRAVENOUS

## 2017-12-02 MED ORDER — ACETAMINOPHEN 10 MG/ML IV SOLN
INTRAVENOUS | Status: DC | PRN
  Administered 2017-12-02: 1000 mg via INTRAVENOUS

## 2017-12-02 MED ORDER — ENOXAPARIN SODIUM 40 MG/0.4ML ~~LOC~~ SOLN
40.0000 mg | SUBCUTANEOUS | Status: DC
  Administered 2017-12-03 – 2017-12-04 (×2): 40 mg via SUBCUTANEOUS
  Filled 2017-12-02 (×2): qty 0.4

## 2017-12-02 MED ORDER — ROCURONIUM BROMIDE 100 MG/10ML IV SOLN
INTRAVENOUS | Status: DC | PRN
  Administered 2017-12-02: 40 mg via INTRAVENOUS
  Administered 2017-12-02: 10 mg via INTRAVENOUS

## 2017-12-02 MED ORDER — CLINDAMYCIN PHOSPHATE 900 MG/50ML IV SOLN
900.0000 mg | Freq: Once | INTRAVENOUS | Status: AC
  Administered 2017-12-02: 900 mg via INTRAVENOUS

## 2017-12-02 MED ORDER — PHENOL 1.4 % MT LIQD
1.0000 | OROMUCOSAL | Status: DC | PRN
  Filled 2017-12-02: qty 177

## 2017-12-02 MED ORDER — METOCLOPRAMIDE HCL 5 MG/ML IJ SOLN: 5.0000 mg | Freq: Three times a day (TID) | INTRAMUSCULAR | Status: DC | PRN

## 2017-12-02 MED ORDER — ZOLPIDEM TARTRATE 5 MG PO TABS: 5.0000 mg | ORAL_TABLET | Freq: Every evening | ORAL | Status: DC | PRN

## 2017-12-02 MED ORDER — METOPROLOL SUCCINATE ER 25 MG PO TB24
25.0000 mg | ORAL_TABLET | Freq: Every day | ORAL | Status: DC
  Administered 2017-12-02 – 2017-12-04 (×2): 25 mg via ORAL
  Filled 2017-12-02 (×3): qty 1

## 2017-12-02 MED ORDER — ONDANSETRON HCL 4 MG PO TABS: 4.0000 mg | ORAL_TABLET | Freq: Four times a day (QID) | ORAL | Status: DC | PRN

## 2017-12-02 MED ORDER — MIDAZOLAM HCL 2 MG/2ML IJ SOLN
INTRAMUSCULAR | Status: AC
  Filled 2017-12-02: qty 2

## 2017-12-02 MED ORDER — BISACODYL 5 MG PO TBEC
5.0000 mg | DELAYED_RELEASE_TABLET | Freq: Every day | ORAL | Status: DC | PRN
  Administered 2017-12-04: 5 mg via ORAL
  Filled 2017-12-02: qty 1

## 2017-12-02 MED ORDER — SENNOSIDES-DOCUSATE SODIUM 8.6-50 MG PO TABS: 1.0000 | ORAL_TABLET | Freq: Every evening | ORAL | Status: DC | PRN

## 2017-12-02 MED ORDER — GABAPENTIN 300 MG PO CAPS
300.0000 mg | ORAL_CAPSULE | Freq: Three times a day (TID) | ORAL | Status: DC
  Administered 2017-12-02 – 2017-12-03 (×3): 300 mg via ORAL
  Filled 2017-12-02 (×5): qty 1

## 2017-12-02 MED ORDER — HYDROCHLOROTHIAZIDE 25 MG PO TABS
50.0000 mg | ORAL_TABLET | Freq: Every day | ORAL | Status: DC
  Administered 2017-12-02 – 2017-12-03 (×2): 50 mg via ORAL
  Filled 2017-12-02 (×2): qty 2

## 2017-12-02 MED ORDER — MAGNESIUM CITRATE PO SOLN
1.0000 | Freq: Once | ORAL | Status: AC | PRN
  Administered 2017-12-04: 1 via ORAL
  Filled 2017-12-02 (×3): qty 296

## 2017-12-02 MED ORDER — ONDANSETRON HCL 4 MG/2ML IJ SOLN
4.0000 mg | Freq: Four times a day (QID) | INTRAMUSCULAR | Status: DC | PRN
  Administered 2017-12-02: 4 mg via INTRAVENOUS
  Filled 2017-12-02: qty 2

## 2017-12-02 MED ORDER — OXYCODONE HCL 5 MG PO TABS
10.0000 mg | ORAL_TABLET | ORAL | Status: DC | PRN
  Administered 2017-12-03 (×2): 10 mg via ORAL
  Filled 2017-12-02: qty 2

## 2017-12-02 MED ORDER — SUCCINYLCHOLINE CHLORIDE 20 MG/ML IJ SOLN
INTRAMUSCULAR | Status: DC | PRN
  Administered 2017-12-02: 120 mg via INTRAVENOUS

## 2017-12-02 MED ORDER — CLINDAMYCIN PHOSPHATE 900 MG/50ML IV SOLN
900.0000 mg | Freq: Four times a day (QID) | INTRAVENOUS | Status: AC
  Administered 2017-12-02 – 2017-12-03 (×3): 900 mg via INTRAVENOUS
  Filled 2017-12-02 (×3): qty 50

## 2017-12-02 MED ORDER — ACETAMINOPHEN 10 MG/ML IV SOLN
INTRAVENOUS | Status: AC
  Filled 2017-12-02: qty 100

## 2017-12-02 MED ORDER — NEOMYCIN-POLYMYXIN B GU 40-200000 IR SOLN
Status: DC | PRN
  Administered 2017-12-02: 4 mL

## 2017-12-02 MED ORDER — HYDRALAZINE HCL 25 MG PO TABS
25.0000 mg | ORAL_TABLET | Freq: Three times a day (TID) | ORAL | Status: DC
  Administered 2017-12-02 – 2017-12-03 (×3): 25 mg via ORAL
  Filled 2017-12-02 (×6): qty 1

## 2017-12-02 MED ORDER — ACETAMINOPHEN 500 MG PO TABS
1000.0000 mg | ORAL_TABLET | Freq: Four times a day (QID) | ORAL | Status: AC
  Administered 2017-12-02 – 2017-12-03 (×4): 1000 mg via ORAL
  Filled 2017-12-02 (×4): qty 2

## 2017-12-02 MED ORDER — TRANEXAMIC ACID 1000 MG/10ML IV SOLN
1000.0000 mg | INTRAVENOUS | Status: AC
  Administered 2017-12-02: 1000 mg via INTRAVENOUS
  Filled 2017-12-02: qty 1000

## 2017-12-02 MED ORDER — PANTOPRAZOLE SODIUM 40 MG PO TBEC
80.0000 mg | DELAYED_RELEASE_TABLET | Freq: Every day | ORAL | Status: DC
  Administered 2017-12-02 – 2017-12-04 (×3): 80 mg via ORAL
  Filled 2017-12-02 (×3): qty 2

## 2017-12-02 MED ORDER — HYDROMORPHONE HCL 1 MG/ML IJ SOLN
0.2500 mg | INTRAMUSCULAR | Status: DC | PRN
  Administered 2017-12-02 (×2): 0.5 mg via INTRAVENOUS
  Administered 2017-12-02 (×2): 0.25 mg via INTRAVENOUS
  Administered 2017-12-02: 0.5 mg via INTRAVENOUS

## 2017-12-02 MED ORDER — DOCUSATE SODIUM 100 MG PO CAPS
100.0000 mg | ORAL_CAPSULE | Freq: Two times a day (BID) | ORAL | Status: DC
  Administered 2017-12-02 – 2017-12-04 (×4): 100 mg via ORAL
  Filled 2017-12-02 (×4): qty 1

## 2017-12-02 MED ORDER — CLINDAMYCIN PHOSPHATE 900 MG/50ML IV SOLN
INTRAVENOUS | Status: AC
  Filled 2017-12-02: qty 50

## 2017-12-02 MED ORDER — PHENYLEPHRINE HCL 10 MG/ML IJ SOLN
INTRAMUSCULAR | Status: DC | PRN
  Administered 2017-12-02: 100 ug via INTRAVENOUS

## 2017-12-02 MED ORDER — METHOCARBAMOL 1000 MG/10ML IJ SOLN
500.0000 mg | Freq: Four times a day (QID) | INTRAVENOUS | Status: DC | PRN
  Filled 2017-12-02: qty 5

## 2017-12-02 MED ORDER — SUMATRIPTAN SUCCINATE 50 MG PO TABS
100.0000 mg | ORAL_TABLET | ORAL | Status: DC | PRN
  Administered 2017-12-03 – 2017-12-04 (×4): 100 mg via ORAL
  Filled 2017-12-02 (×5): qty 2

## 2017-12-02 MED ORDER — PROPOFOL 10 MG/ML IV BOLUS
INTRAVENOUS | Status: DC | PRN
  Administered 2017-12-02: 200 mg via INTRAVENOUS

## 2017-12-02 MED ORDER — OXYCODONE HCL 5 MG PO TABS
5.0000 mg | ORAL_TABLET | ORAL | Status: DC | PRN
  Administered 2017-12-02 – 2017-12-04 (×3): 10 mg via ORAL
  Filled 2017-12-02 (×4): qty 2

## 2017-12-02 MED ORDER — HYDROMORPHONE HCL 1 MG/ML IJ SOLN: 0.5000 mg | INTRAMUSCULAR | Status: DC | PRN

## 2017-12-02 MED ORDER — ACETAMINOPHEN 325 MG PO TABS: 325.0000 mg | ORAL_TABLET | Freq: Four times a day (QID) | ORAL | Status: DC | PRN

## 2017-12-02 MED ORDER — FENTANYL CITRATE (PF) 100 MCG/2ML IJ SOLN
25.0000 ug | INTRAMUSCULAR | Status: AC | PRN
  Administered 2017-12-02 (×6): 25 ug via INTRAVENOUS

## 2017-12-02 MED ORDER — METOCLOPRAMIDE HCL 10 MG PO TABS: 5.0000 mg | ORAL_TABLET | Freq: Three times a day (TID) | ORAL | Status: DC | PRN

## 2017-12-02 MED ORDER — MIDAZOLAM HCL 2 MG/2ML IJ SOLN
INTRAMUSCULAR | Status: DC | PRN
  Administered 2017-12-02: 2 mg via INTRAVENOUS

## 2017-12-02 MED ORDER — METHOCARBAMOL 500 MG PO TABS: 500.0000 mg | ORAL_TABLET | Freq: Four times a day (QID) | ORAL | Status: DC | PRN

## 2017-12-02 MED ORDER — ALUM & MAG HYDROXIDE-SIMETH 200-200-20 MG/5ML PO SUSP: 30.0000 mL | ORAL | Status: DC | PRN

## 2017-12-02 MED ORDER — DEXAMETHASONE SODIUM PHOSPHATE 10 MG/ML IJ SOLN
INTRAMUSCULAR | Status: DC | PRN
  Administered 2017-12-02: 10 mg via INTRAVENOUS

## 2017-12-02 MED ORDER — BACLOFEN 10 MG PO TABS
20.0000 mg | ORAL_TABLET | Freq: Every evening | ORAL | Status: DC | PRN
  Filled 2017-12-02: qty 2

## 2017-12-02 MED ORDER — IRBESARTAN 150 MG PO TABS
300.0000 mg | ORAL_TABLET | Freq: Every day | ORAL | Status: DC
  Administered 2017-12-02 – 2017-12-04 (×2): 300 mg via ORAL
  Filled 2017-12-02 (×3): qty 2

## 2017-12-02 MED ORDER — ONDANSETRON HCL 4 MG/2ML IJ SOLN: 4.0000 mg | Freq: Once | INTRAMUSCULAR | Status: DC | PRN

## 2017-12-02 SURGICAL SUPPLY — 58 items
BLADE SAGITTAL AGGR TOOTH XLG (BLADE) ×2 IMPLANT
BNDG COHESIVE 6X5 TAN STRL LF (GAUZE/BANDAGES/DRESSINGS) ×6 IMPLANT
CANISTER SUCT 1200ML W/VALVE (MISCELLANEOUS) ×2 IMPLANT
CHLORAPREP W/TINT 26ML (MISCELLANEOUS) ×2 IMPLANT
DRAPE C-ARM XRAY 36X54 (DRAPES) ×2 IMPLANT
DRAPE INCISE IOBAN 66X60 STRL (DRAPES) IMPLANT
DRAPE POUCH INSTRU U-SHP 10X18 (DRAPES) ×2 IMPLANT
DRAPE SHEET LG 3/4 BI-LAMINATE (DRAPES) ×6 IMPLANT
DRAPE TABLE BACK 80X90 (DRAPES) ×2 IMPLANT
DRESSING SURGICEL FIBRLLR 1X2 (HEMOSTASIS) ×2 IMPLANT
DRSG OPSITE POSTOP 4X8 (GAUZE/BANDAGES/DRESSINGS) ×4 IMPLANT
DRSG SURGICEL FIBRILLAR 1X2 (HEMOSTASIS) ×4
ELECT BLADE 6.5 EXT (BLADE) ×2 IMPLANT
ELECT REM PT RETURN 9FT ADLT (ELECTROSURGICAL) ×2
ELECTRODE REM PT RTRN 9FT ADLT (ELECTROSURGICAL) ×1 IMPLANT
GLOVE BIOGEL PI IND STRL 9 (GLOVE) ×1 IMPLANT
GLOVE BIOGEL PI INDICATOR 9 (GLOVE) ×1
GLOVE SURG SYN 9.0  PF PI (GLOVE) ×2
GLOVE SURG SYN 9.0 PF PI (GLOVE) ×2 IMPLANT
GOWN SRG 2XL LVL 4 RGLN SLV (GOWNS) ×1 IMPLANT
GOWN STRL NON-REIN 2XL LVL4 (GOWNS) ×1
GOWN STRL REUS W/ TWL LRG LVL3 (GOWN DISPOSABLE) ×1 IMPLANT
GOWN STRL REUS W/TWL LRG LVL3 (GOWN DISPOSABLE) ×1
HEAD FEMORAL 28MM SZ S (Head) ×2 IMPLANT
HEMOVAC 400CC 10FR (MISCELLANEOUS) IMPLANT
HOLDER FOLEY CATH W/STRAP (MISCELLANEOUS) ×2 IMPLANT
HOOD PEEL AWAY FLYTE STAYCOOL (MISCELLANEOUS) ×2 IMPLANT
KIT PREVENA INCISION MGT 13 (CANNISTER) IMPLANT
LINER DM 28MM (Liner) ×2 IMPLANT
LINER DM SZH 28X56 (Liner) ×1 IMPLANT
MAT ABSORB  FLUID 56X50 GRAY (MISCELLANEOUS) ×1
MAT ABSORB FLUID 56X50 GRAY (MISCELLANEOUS) ×1 IMPLANT
NDL SAFETY ECLIPSE 18X1.5 (NEEDLE) ×1 IMPLANT
NEEDLE HYPO 18GX1.5 SHARP (NEEDLE) ×1
NEEDLE SPNL 18GX3.5 QUINCKE PK (NEEDLE) ×2 IMPLANT
NS IRRIG 1000ML POUR BTL (IV SOLUTION) ×2 IMPLANT
PACK HIP COMPR (MISCELLANEOUS) ×2 IMPLANT
SCALPEL PROTECTED #10 DISP (BLADE) ×4 IMPLANT
SHELL ACETABULAR DM  56MM (Shell) ×2 IMPLANT
SOL PREP PVP 2OZ (MISCELLANEOUS) ×2
SOLUTION PREP PVP 2OZ (MISCELLANEOUS) ×1 IMPLANT
SPONGE DRAIN TRACH 4X4 STRL 2S (GAUZE/BANDAGES/DRESSINGS) ×2 IMPLANT
STAPLER SKIN PROX 35W (STAPLE) ×2 IMPLANT
STEM FEMORAL SZ5 LAT COLLARED (Stem) ×2 IMPLANT
STRAP SAFETY 5IN WIDE (MISCELLANEOUS) ×2 IMPLANT
SUT DVC 2 QUILL PDO  T11 36X36 (SUTURE) ×1
SUT DVC 2 QUILL PDO T11 36X36 (SUTURE) ×1 IMPLANT
SUT SILK 0 (SUTURE) ×1
SUT SILK 0 30XBRD TIE 6 (SUTURE) ×1 IMPLANT
SUT V-LOC 90 ABS DVC 3-0 CL (SUTURE) ×2 IMPLANT
SUT VIC AB 1 CT1 36 (SUTURE) ×2 IMPLANT
SYR 20CC LL (SYRINGE) ×2 IMPLANT
SYR 30ML LL (SYRINGE) ×2 IMPLANT
SYR BULB IRRIG 60ML STRL (SYRINGE) ×2 IMPLANT
TAPE MICROFOAM 4IN (TAPE) ×2 IMPLANT
TOWEL OR 17X26 4PK STRL BLUE (TOWEL DISPOSABLE) ×2 IMPLANT
TRAY FOLEY MTR SLVR 16FR STAT (SET/KITS/TRAYS/PACK) ×2 IMPLANT
WND VAC CANISTER 500ML (MISCELLANEOUS) ×2 IMPLANT

## 2017-12-02 NOTE — Anesthesia Preprocedure Evaluation (Signed)
Anesthesia Evaluation  Patient identified by MRN, date of birth, ID band Patient awake    Reviewed: Allergy & Precautions, NPO status , Patient's Chart, lab work & pertinent test results  History of Anesthesia Complications Negative for: history of anesthetic complications  Airway Mallampati: III       Dental  (+) Missing, Poor Dentition, Chipped   Pulmonary neg sleep apnea, neg COPD, Current Smoker,           Cardiovascular hypertension, Pt. on medications and Pt. on home beta blockers (-) Past MI and (-) CHF (-) dysrhythmias (-) Valvular Problems/Murmurs     Neuro/Psych neg Seizures    GI/Hepatic Neg liver ROS, GERD  Medicated,  Endo/Other  neg diabetes  Renal/GU negative Renal ROS     Musculoskeletal   Abdominal   Peds  Hematology   Anesthesia Other Findings   Reproductive/Obstetrics                             Anesthesia Physical Anesthesia Plan  ASA: II  Anesthesia Plan: General   Post-op Pain Management:    Induction:   PONV Risk Score and Plan: 1 and Ondansetron  Airway Management Planned: Oral ETT  Additional Equipment:   Intra-op Plan:   Post-operative Plan:   Informed Consent: I have reviewed the patients History and Physical, chart, labs and discussed the procedure including the risks, benefits and alternatives for the proposed anesthesia with the patient or authorized representative who has indicated his/her understanding and acceptance.     Plan Discussed with:   Anesthesia Plan Comments:         Anesthesia Quick Evaluation

## 2017-12-02 NOTE — Evaluation (Signed)
Physical Therapy Evaluation Patient Details Name: Kristopher [name] MRN: [id number] DOB: Jan 22, 1961 Today's Date: 12/02/2017   History of Present Illness  Pt is a pleasant 57 y.o male s/p total hip replacement with anterior approach. Pt with PMH significant for HTN.   Clinical Impression  Pt alert and oriented upon arrival; eager to work with PT and work on mobility. Pt with limited pain t/o session, however premedicated before session. Adequate strength noted in L hip with exercises requiring active assist for hip abduction/adduction and heel slides. Min assist needed for supine to sit due to weakness in L LE requiring SPT to bring LE off bed. No physical assist required for sit to stand, however pt requiring increased time to perform transfer. Pt with good adherence to 50% WBing status during just a few feet of ambulation to chair, however initially putting no weight through L LE although with VC able to achieve about 50%. Shortened step length, decreased cadence, and heavy UE reliance noted. HR beginning at 105 before session, reaching as high as 115 with exertion. SpO2 remaining in high 90's throughout session. BP 134/93 in supine during exercises. 10 minutes spent apart from evaluation performing therapeutic exercises. Pt would benefit from continued skilled PT and is appropriate for HHPT following discharge to further improve functional mobility.     Follow Up Recommendations Home health PT    Equipment Recommendations       Recommendations for Other Services       Precautions / Restrictions Precautions Precautions: Fall Restrictions Weight Bearing Restrictions: Yes Other Position/Activity Restrictions: 50% weight bearing on L LE for 4 weeks per surgeon       Mobility  Bed Mobility Overal bed mobility: Needs Assistance Bed Mobility: Supine to Sit     Supine to sit: Min assist;HOB elevated     General bed mobility comments: pt with good effort, unable to move L LE off of bed  independently requiring SPT min assist, no assist needed for trunk elevation, pt with no sharp increase in pain  Transfers Overall transfer level: Needs assistance Equipment used: Rolling walker (2 wheeled) Transfers: Sit to/from Stand Sit to Stand: Min guard         General transfer comment: pt with excellent effort during sit to stand, no physical assist required, pt requiring increased time and effort noted, no LOB once standing, able to reach for RW safely, VC to remain withing 50% WB status  Ambulation/Gait Ambulation/Gait assistance: Min guard Gait Distance (Feet): 5 Feet Assistive device: Rolling walker (2 wheeled)       General Gait Details: Pt initially bearing no weight through L LE but with VC was able to tolerate about 50% WBing with a few steps, heavy UE reliance on RW, shortened step on R, pt with no sharp increase in pain during ambulation a few feet from bed to chair  Stairs            Wheelchair Mobility    Modified Rankin (Stroke Patients Only)       Balance Overall balance assessment: No apparent balance deficits (not formally assessed)                                           Pertinent Vitals/Pain Pain Assessment: 0-10 Pain Score: 3  Pain Location: L hip Pain Descriptors / Indicators: Aching Pain Intervention(s): Limited activity within patient's tolerance;Monitored during session;Repositioned;Ice applied;Premedicated  before session    Gem expects to be discharged to:: Private residence Living Arrangements: Spouse/significant other;Children Available Help at Discharge: Family Type of Home: House Home Access: Stairs to enter   Technical brewer of Steps: [place]: [place]: Environmental consultant - 2 wheels;Cane - single point;Wheelchair - manual      Prior Function Level of Independence: Independent with assistive device(s)         Comments: Occasional use of cane, but pt reports  he primarily carried it for "just in case"     Hand Dominance        Extremity/Trunk Assessment   Upper Extremity Assessment Upper Extremity Assessment: Overall WFL for tasks assessed    Lower Extremity Assessment Lower Extremity Assessment: Generalized weakness(R LE WFL, knee and ankle strength WFL for tasks assessed, hip grossly 3-/5 due to post op weakness)       Communication   Communication: No difficulties  Cognition Arousal/Alertness: Awake/alert Behavior During Therapy: WFL for tasks assessed/performed Overall Cognitive Status: Within Functional Limits for tasks assessed                                        General Comments      Exercises Total Joint Exercises Ankle Circles/Pumps: AROM;Both;10 reps;Supine Quad Sets: AROM;Left;10 reps;Supine Gluteal Sets: AROM;Left;10 reps;Supine Towel Squeeze: AROM;Left;10 reps;Supine Short Arc Quad: AROM;Left;10 reps;Supine Heel Slides: AAROM;Left;10 reps;Supine Hip ABduction/ADduction: AAROM;Left;10 reps;Supine   Assessment/Plan    PT Assessment Patient needs continued PT services  PT Problem List Decreased strength;Decreased range of motion;Decreased activity tolerance;Decreased balance;Decreased mobility;Decreased knowledge of use of DME;Decreased safety awareness;Pain       PT Treatment Interventions DME instruction;Balance training;Gait training;Stair training;Functional mobility training;Patient/family education;Therapeutic activities;Therapeutic exercise    PT Goals (Current goals can be found in the Care Plan section)  Acute Rehab PT Goals Patient Stated Goal: to be pain free and go home  PT Goal Formulation: With patient Time For Goal Achievement: 12/16/17 Potential to Achieve Goals: Good    Frequency BID   Barriers to discharge        Co-evaluation               AM-PAC PT "6 Clicks" Daily Activity  Outcome Measure Difficulty turning over in bed (including adjusting bedclothes,  sheets and blankets)?: A Little Difficulty moving from lying on back to sitting on the side of the bed? : A Little Difficulty sitting down on and standing up from a chair with arms (e.g., wheelchair, bedside commode, etc,.)?: A Little Help needed moving to and from a bed to chair (including a wheelchair)?: A Little Help needed walking in hospital room?: A Little Help needed climbing 3-5 steps with a railing? : A Lot 6 Click Score: 17    End of Session Equipment Utilized During Treatment: Gait belt Activity Tolerance: Patient tolerated treatment well Patient left: in chair;with call bell/phone within reach;with chair alarm set;with SCD's reapplied Nurse Communication: Mobility status PT Visit Diagnosis: Other abnormalities of gait and mobility (R26.89);Muscle weakness (generalized) (M62.81);Pain Pain - Right/Left: Left Pain - part of body: Hip    Time: 0865-7846 PT Time Calculation (min) (ACUTE ONLY): 31 min   Charges:              Ernie Avena, SPT 12/02/2017, 4:46 PM

## 2017-12-02 NOTE — Anesthesia Procedure Notes (Signed)
Procedure Name: Intubation Date/Time: 12/02/2017 10:20 AM Performed by: Nelda Marseille, CRNA Pre-anesthesia Checklist: Patient identified, Patient being monitored, Timeout performed, Emergency Drugs available and Suction available Patient Re-evaluated:Patient Re-evaluated prior to induction Oxygen Delivery Method: Circle system utilized Preoxygenation: Pre-oxygenation with 100% oxygen Induction Type: IV induction Ventilation: Mask ventilation without difficulty Laryngoscope Size: Mac, 3 and McGraph Grade View: Grade I Tube type: Oral Tube size: 7.5 mm Number of attempts: 1 Airway Equipment and Method: Stylet Placement Confirmation: ETT inserted through vocal cords under direct vision,  positive ETCO2 and breath sounds checked- equal and bilateral Secured at: 21 cm Tube secured with: Tape Dental Injury: Teeth and Oropharynx as per pre-operative assessment

## 2017-12-02 NOTE — Transfer of Care (Signed)
Immediate Anesthesia Transfer of Care Note  Patient: Kristopher [name]  Procedure(s) Performed: TOTAL HIP ARTHROPLASTY ANTERIOR APPROACH (Left )  Patient Location: PACU  Anesthesia Type:General  Level of Consciousness: awake and sedated  Airway & Oxygen Therapy: Patient Spontanous Breathing and Patient connected to face mask oxygen  Post-op Assessment: Report given to RN and Post -op Vital signs reviewed and stable  Post vital signs: Reviewed and stable  Last Vitals:  Vitals Value Taken Time  BP 162/123 12/02/2017 12:10 PM  Temp    Pulse 109 12/02/2017 12:10 PM  Resp 16 12/02/2017 12:10 PM  SpO2 100 % 12/02/2017 12:10 PM  Vitals shown include unvalidated device data.  Last Pain:  Vitals:   12/02/17 0823  TempSrc: Oral  PainSc: 3          Complications: No apparent anesthesia complications

## 2017-12-02 NOTE — Progress Notes (Signed)
Received call from [facility]; Type and Screen has expired. New order placed.

## 2017-12-02 NOTE — NC FL2 (Signed)
[place] LEVEL OF CARE SCREENING TOOL     IDENTIFICATION  Patient Name: Kristopher [name] Birthdate: September 24, 1960 Sex: male Admission Date (Current Location): 12/02/2017  [facility] and [place] Number:  Selena Lesser ([phone]) Facility and Address:  [facility], [address], [place], [place] [address]      Provider Number: [id number]  Attending Physician Name and Address:  Hessie Knows, MD  Relative Name and Phone Number:       Current Level of Care: Hospital Recommended Level of Care: [place] Prior Approval Number:    Date Approved/Denied:   PASRR Number: [id number])  Discharge Plan: SNF    Current Diagnoses: Patient Active Problem List   Diagnosis Date Noted  . S/P hip replacement 12/02/2017  . Hip arthritis 10/01/2017  . Aortic atherosclerosis ([place]) 08/25/2017  . Left-sided weakness 08/13/2017  . Musculoskeletal pain, chronic 04/03/2017  . Tobacco abuse 06/24/2016  . GERD (gastroesophageal reflux disease) 06/24/2016  . Special screening for malignant neoplasms, colon   . Benign neoplasm of ascending colon   . Polyp of sigmoid colon   . Rectal polyp   . Benign hypertensive renal disease 01/22/2016  . Migraine 01/22/2016  . Chronic pain syndrome 01/11/2016  . Chronic sacroiliac joint pain (Bilateral) (L>R) 05/31/2015  . Allergy history, anesthetic (Unconfirmed allergy to Lidocaine) 05/31/2015  . Chronic hip pain (Left) 05/31/2015  . Lumbar facet syndrome (Location of Primary Source of Pain) (Bilateral) (L>R) 05/31/2015  . Chronic lower extremity pain (Location of Secondary source of pain) (Left) 05/31/2015  . Chronic upper extremity pain (Location of Tertiary source of pain) (Bilateral) (L>R) 05/31/2015  . Greater occipital neuralgia (Right) 05/31/2015  . Retrolisthesis of L5-S1 02/06/2015  . Cervical disc herniation (C4-5 and C5-6) 02/06/2015  . Lumbar disc herniation (L5-S1) 02/06/2015  . Hypokalemia  02/01/2015  . Cervical spinal stenosis (C4-5) 01/06/2015  . Cervical foraminal stenosis (Bilateral C5-6) 01/06/2015  . Chronic low back pain (Location of Primary Source of Pain) (Bilateral) (L>R) 01/05/2015  . Lumbar spondylosis 01/05/2015  . Chronic lumbar radicular pain (Location of Secondary source of pain) (S1 dermatomal) (Left) 01/05/2015  . 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) 01/05/2015  . Chronic neck pain (posterior midline) (Bilateral) (L>R) 01/05/2015  . Cervical spondylosis 01/05/2015  . Chronic cervical radicular pain (Location of Tertiary source of pain) (Bilateral) (C5/C6 dermatome) (L>R) 01/05/2015  . Long term current use of opiate analgesic 01/05/2015  . Long term prescription opiate use 01/05/2015  . Opiate use (60 MME/Day) 01/05/2015  . Opioid dependence ([place]) 01/05/2015  . Encounter for therapeutic drug level monitoring 01/05/2015    Orientation RESPIRATION BLADDER Height & Weight     Self, Time, Situation, Place  Normal Continent Weight: 238 lb (108 kg) Height:  5\' 10"  (177.8 cm)  BEHAVIORAL SYMPTOMS/MOOD NEUROLOGICAL BOWEL NUTRITION STATUS      Continent Diet(Diet: Regular )  AMBULATORY STATUS COMMUNICATION OF NEEDS Skin   Extensive Assist Verbally Surgical wounds, Wound Vac(Incision: Left Hip, Provena Wound Vac. )                       Personal Care Assistance Level of Assistance  Bathing, Feeding, Dressing Bathing Assistance: Limited assistance Feeding assistance: Independent Dressing Assistance: Limited assistance     Functional Limitations Info  Sight, Hearing, Speech Sight Info: Adequate Hearing Info: Adequate Speech Info: Adequate    SPECIAL CARE FACTORS FREQUENCY  PT (By licensed PT), OT (By licensed OT)  PT Frequency: (5) OT Frequency: (5)            Contractures      Additional Factors Info  Code Status, Allergies Code Status Info: (Full Code. ) Allergies Info: (Aspirin, Epinephrine,  Lidocaine, Novocain Procaine, Penicillins, Strawberry Extract, Shellfish Allergy)           Current Medications (12/02/2017):  This is the current hospital active medication list Current Facility-Administered Medications  Medication Dose Route Frequency Provider Last Rate Last Dose  . 0.9 %  sodium chloride infusion   Intravenous Continuous Hessie Knows, MD      . acetaminophen (TYLENOL) tablet 1,000 mg  1,000 mg Oral Q6H Hessie Knows, MD      . Derrill Memo ON 12/03/2017] acetaminophen (TYLENOL) tablet 325-650 mg  325-650 mg Oral Q6H PRN Hessie Knows, MD      . alum & mag hydroxide-simeth (MAALOX/MYLANTA) 200-200-20 MG/5ML suspension 30 mL  30 mL Oral Q4H PRN Hessie Knows, MD      . amLODipine (NORVASC) tablet 10 mg  10 mg Oral Daily Hessie Knows, MD       And  . irbesartan (AVAPRO) tablet 300 mg  300 mg Oral Daily Hessie Knows, MD      . baclofen (LIORESAL) tablet 20 mg  20 mg Oral QHS PRN Hessie Knows, MD      . bisacodyl (DULCOLAX) EC tablet 5 mg  5 mg Oral Daily PRN Hessie Knows, MD      . clindamycin (CLEOCIN) IVPB 900 mg  900 mg Intravenous Q6H Hessie Knows, MD      . diphenhydrAMINE (BENADRYL) 12.5 MG/5ML elixir 12.5-25 mg  12.5-25 mg Oral Q4H PRN Hessie Knows, MD   25 mg at 12/02/17 1507  . docusate sodium (COLACE) capsule 100 mg  100 mg Oral BID Hessie Knows, MD      . doxazosin (CARDURA) tablet 4 mg  4 mg Oral Daily Hessie Knows, MD      . Derrill Memo ON 12/03/2017] enoxaparin (LOVENOX) injection 40 mg  40 mg Subcutaneous Q24H Hessie Knows, MD      . gabapentin (NEURONTIN) capsule 300 mg  300 mg Oral TID Hessie Knows, MD      . hydrALAZINE (APRESOLINE) tablet 25 mg  25 mg Oral TID Hessie Knows, MD      . hydrochlorothiazide (HYDRODIURIL) tablet 50 mg  50 mg Oral QHS Hessie Knows, MD      . HYDROmorphone (DILAUDID) injection 0.5-1 mg  0.5-1 mg Intravenous Q4H PRN Hessie Knows, MD      . magnesium citrate solution 1 Bottle  1 Bottle Oral Once PRN Hessie Knows, MD      .  menthol-cetylpyridinium (CEPACOL) lozenge 3 mg  1 lozenge Oral PRN Hessie Knows, MD       Or  . phenol (CHLORASEPTIC) mouth spray 1 spray  1 spray Mouth/Throat PRN Hessie Knows, MD      . methocarbamol (ROBAXIN) tablet 500 mg  500 mg Oral Q6H PRN Hessie Knows, MD       Or  . methocarbamol (ROBAXIN) 500 mg in dextrose 5 % 50 mL IVPB  500 mg Intravenous Q6H PRN Hessie Knows, MD      . metoCLOPramide (REGLAN) tablet 5-10 mg  5-10 mg Oral Q8H PRN Hessie Knows, MD       Or  . metoCLOPramide (REGLAN) injection 5-10 mg  5-10 mg Intravenous Q8H PRN Hessie Knows, MD      . metoprolol succinate (TOPROL-XL) 24 hr tablet 100 mg  100 mg Oral QHS Hessie Knows, MD      . metoprolol succinate (TOPROL-XL) 24 hr tablet 25 mg  25 mg Oral Daily Hessie Knows, MD      . ondansetron [facility]) tablet 4 mg  4 mg Oral Q6H PRN Hessie Knows, MD       Or  . ondansetron [facility]) injection 4 mg  4 mg Intravenous Q6H PRN Hessie Knows, MD      . oxyCODONE (Oxy IR/ROXICODONE) immediate release tablet 10-15 mg  10-15 mg Oral Q4H PRN Hessie Knows, MD      . oxyCODONE (Oxy IR/ROXICODONE) immediate release tablet 5-10 mg  5-10 mg Oral Q4H PRN Hessie Knows, MD   10 mg at 12/02/17 1506  . pantoprazole (PROTONIX) EC tablet 80 mg  80 mg Oral Q1200 Hessie Knows, MD      . potassium chloride (KLOR-CON) packet 20 mEq  20 mEq Oral Daily Hessie Knows, MD      . senna-docusate (Senokot-S) tablet 1 tablet  1 tablet Oral QHS PRN Hessie Knows, MD      . SUMAtriptan (IMITREX) tablet 100 mg  100 mg Oral Q2H PRN Hessie Knows, MD      . traMADol [facility]) tablet 50 mg  50 mg Oral Q6H Hessie Knows, MD      . zolpidem (AMBIEN) tablet 5 mg  5 mg Oral QHS PRN,MR X 1 Hessie Knows, MD         Discharge Medications: Please see discharge summary for a list of discharge medications.  Relevant Imaging Results:  Relevant Lab Results:   Additional Information (SSN: [id number])  Lewanda Perea, Veronia Beets, LCSW

## 2017-12-02 NOTE — Anesthesia Post-op Follow-up Note (Signed)
Anesthesia QCDR form completed.        

## 2017-12-02 NOTE — H&P (Signed)
Reviewed paper H+P, will be scanned into chart. No changes noted.  

## 2017-12-02 NOTE — Progress Notes (Signed)
Per Dr. Ronelle Nigh, give patient amlodipine 10mg  and cardura 4mg  once for blood pressure. Order enter; awaiting pharmacy

## 2017-12-02 NOTE — Anesthesia Postprocedure Evaluation (Signed)
Anesthesia Post Note  Patient: Kristopher [name]  Procedure(s) Performed: TOTAL HIP ARTHROPLASTY ANTERIOR APPROACH (Left )  Patient location during evaluation: PACU Anesthesia Type: General Level of consciousness: awake and alert Pain management: pain level controlled Vital Signs Assessment: post-procedure vital signs reviewed and stable Respiratory status: spontaneous breathing and respiratory function stable Cardiovascular status: stable Anesthetic complications: no     Last Vitals:  Vitals:   12/02/17 1217 12/02/17 1225  BP: (!) 156/116 (!) 156/120  Pulse: 100 (!) 106  Resp: 10 12  Temp:    SpO2: 97% 99%    Last Pain:  Vitals:   12/02/17 1210  TempSrc:   PainSc: 9                  KEPHART,WILLIAM K

## 2017-12-03 ENCOUNTER — Encounter: Payer: Self-pay | Admitting: Orthopedic Surgery

## 2017-12-03 NOTE — Progress Notes (Signed)
   Subjective: 1 Day Post-Op Procedure(s) (LRB): TOTAL HIP ARTHROPLASTY ANTERIOR APPROACH (Left) Patient reports pain as mild.   Patient is well, and has had no acute complaints or problems Denies any CP, SOB, ABD pain. We will continue therapy today.  Plan is to go Home after hospital stay.  Objective: Vital signs in last 24 hours: Temp:  [97.4 F (36.3 C)-98.8 F (37.1 C)] 97.7 F (36.5 C) (10/02 0730) Pulse Rate:  [80-121] 83 (10/02 0730) Resp:  [10-18] 18 (10/02 0730) BP: (105-162)/(63-125) 108/72 (10/02 0405) SpO2:  [93 %-100 %] 94 % (10/02 0730) Weight:  [258 kg] 108 kg (10/01 0823)  Intake/Output from previous day: 10/01 0701 - 10/02 0700 In: 900 [I.V.:800; IV Piggyback:100] Out: 350 [Blood:350] Intake/Output this shift: No intake/output data recorded.  Recent Labs    12/02/17 0914  HGB 14.1   Recent Labs    12/02/17 0914  WBC 6.3  RBC 4.57  HCT 40.6  PLT 160   Recent Labs    12/02/17 0914  CREATININE 0.80   No results for input(s): LABPT, INR in the last 72 hours.  EXAM General - Patient is Alert, Appropriate and Oriented Extremity - Neurovascular intact Sensation intact distally Intact pulses distally Dorsiflexion/Plantar flexion intact No cellulitis present Compartment soft Dressing - dressing C/D/I and no drainage Motor Function - intact, moving foot and toes well on exam.   Past Medical History:  Diagnosis Date  . Allergy   . Arthritis    left foot  . Chronic back pain    Four rods in back  . Hypertension   . Migraines    daily    Assessment/Plan:   1 Day Post-Op Procedure(s) (LRB): TOTAL HIP ARTHROPLASTY ANTERIOR APPROACH (Left) Active Problems:   S/P hip replacement  Estimated body mass index is 34.15 kg/m as calculated from the following:   Height as of this encounter: 5\' 10"  (1.778 m).   Weight as of this encounter: 108 kg. Advance diet Up with therapy, 50 % weight bearing LLE Pain well controlled Labs stable, BMP  pending VSS CM to assist with discharge to home with HHPT   DVT Prophylaxis - Lovenox, TED hose and SCDs 50% Weight-Bearing to Left leg   T. Rachelle Hora, PA-C [name] 12/03/2017, 8:13 AM

## 2017-12-03 NOTE — Progress Notes (Signed)
Clinical Education officer, museum (CSW) received consult for SNF. PT is recommending home health. RN case manager aware of above. Please reconsult if future social work needs arise. CSW signing off.   [employer], LCSW [phone]

## 2017-12-03 NOTE — Evaluation (Signed)
Occupational Therapy Evaluation Patient Details Name: Kristopher [name] MRN: [id number] DOB: 1960-06-26 Today's Date: 12/03/2017    History of Present Illness Pt is a pleasant 57 y.o male s/p total hip replacement with anterior approach. Pt with PMH significant for HTN.    Clinical Impression   Pt seen for OT evaluation this date, POD#1 from above surgery. Pt was independent in all ADLs prior to surgery, however occasionally using a SPC for mobility due to L hip pain. Pt is eager to return to PLOF with less pain and improved safety and independence. Pt currently requires PRN minimal assist for LB bathing and supervision and use of AE for LB dressing after initial instruction during session in how to use. Pt/spouse instructed in self care skills, falls prevention strategies, home/routines modifications, DME/AE for LB bathing and dressing tasks, and compression stocking mgt strategies. Pt would benefit from additional instruction in self care skills and techniques to help maintain precautions with or without assistive devices to support recall and carryover prior to discharge. No follow up OT services following discharge indicated at this time.     Follow Up Recommendations  No OT follow up    Equipment Recommendations  3 in 1 bedside commode;Other (comment)(consider reacher, sock aide)    Recommendations for Other Services       Precautions / Restrictions Precautions Precautions: Fall;Anterior Hip Precaution Booklet Issued: Yes (comment) Restrictions Weight Bearing Restrictions: Yes LLE Weight Bearing: Partial weight bearing LLE Partial Weight Bearing Percentage or Pounds: 50% Other Position/Activity Restrictions: 50% weight bearing on L LE for 4 weeks per surgeon       Mobility Bed Mobility     General bed mobility comments: deferred, up in recliner  Transfers Overall transfer level: Modified independent Equipment used: Rolling walker (2 wheeled) Transfers: Sit to/from  Stand Sit to Stand: Supervision         General transfer comment: pt able to go sit to stand with lower bed height this morning, no physical assist required, pt performing with minimal difficulty, subjective reports of mild off balance noted upon initial standing but this subsiding withing after brief period of standing    Balance Overall balance assessment: Mild deficits observed, not formally tested                                         ADL either performed or assessed with clinical judgement   ADL Overall ADL's : Needs assistance/impaired         Upper Body Bathing: Sitting;Modified independent   Lower Body Bathing: Sit to/from stand;Minimal assistance;With caregiver independent assisting   Upper Body Dressing : Sitting;Modified independent   Lower Body Dressing: Sit to/from stand;With caregiver independent assisting;Supervision/safety Lower Body Dressing Details (indicate cue type and reason): pt/spouse instructed in AE for LB dressing and compression stocking mgt. Pt able to return demo use of AE for doffing/donning socks without assist Toilet Transfer: RW;Min guard;Ambulation;Comfort height toilet           Functional mobility during ADLs: Min guard;Rolling walker       Vision Baseline Vision/History: Wears glasses Wears Glasses: Reading only Patient Visual Report: No change from baseline       Perception     Praxis      Pertinent Vitals/Pain Pain Assessment: 0-10 Pain Score: 2  Pain Location: L hip Pain Descriptors / Indicators: Aching;Sore Pain Intervention(s): Limited activity within patient's tolerance;Monitored  during session;Premedicated before session;Repositioned     Hand Dominance     Extremity/Trunk Assessment Upper Extremity Assessment Upper Extremity Assessment: Overall WFL for tasks assessed   Lower Extremity Assessment Lower Extremity Assessment: Defer to PT evaluation;LLE deficits/detail(RLE WFL) LLE Deficits /  Details: expected post-op strength/ROM deficits   Cervical / Trunk Assessment Cervical / Trunk Assessment: Normal   Communication Communication Communication: No difficulties   Cognition Arousal/Alertness: Awake/alert Behavior During Therapy: WFL for tasks assessed/performed Overall Cognitive Status: Within Functional Limits for tasks assessed                                     General Comments       Exercises Other Exercises Other Exercises: pt/spouse instructed in falls prevention strategies Other Exercises: pt/spouse instructed in home/routines modifications during recovery   Shoulder Instructions      Home Living Family/patient expects to be discharged to:: Private residence Living Arrangements: Spouse/significant other;Children Available Help at Discharge: Family Type of Home: House Home Access: Stairs to enter Technical brewer of Steps: 3   Home Layout: One level     Bathroom Shower/Tub: Teacher, early years/pre: Handicapped height     Home Equipment: Environmental consultant - 2 wheels;Cane - single point;Wheelchair - manual          Prior Functioning/Environment Level of Independence: Independent with assistive device(s)        Comments: Occasional use of cane, but pt reports he primarily carried it for "just in case"        OT Problem List: Decreased strength;Decreased knowledge of use of DME or AE;Decreased range of motion;Pain;Impaired balance (sitting and/or standing)      OT Treatment/Interventions: Self-care/ADL training;Balance training;Therapeutic exercise;Therapeutic activities;DME and/or AE instruction;Patient/family education    OT Goals(Current goals can be found in the care plan section) Acute Rehab OT Goals Patient Stated Goal: "get home and recover so I can move more and lose some weight" OT Goal Formulation: With patient/family Time For Goal Achievement: 12/17/17 Potential to Achieve Goals: Good ADL Goals Pt Will  Perform Lower Body Dressing: with modified independence;sit to/from stand;with adaptive equipment Pt Will Transfer to Toilet: with supervision;ambulating(LRAD, 50% PWBing LLE) Additional ADL Goal #1: Pt will independently instruct family in compression stocking mgt, including donning/doffing, wear schedule, and positioning.  OT Frequency: Min 1X/week   Barriers to D/C:            Co-evaluation              AM-PAC PT "6 Clicks" Daily Activity     Outcome Measure Help from another person eating meals?: None Help from another person taking care of personal grooming?: None Help from another person toileting, which includes using toliet, bedpan, or urinal?: A Little Help from another person bathing (including washing, rinsing, drying)?: A Little Help from another person to put on and taking off regular upper body clothing?: None Help from another person to put on and taking off regular lower body clothing?: A Little 6 Click Score: 21   End of Session    Activity Tolerance: Patient tolerated treatment well Patient left: in chair;with call bell/phone within reach;with chair alarm set;with family/visitor present;with SCD's reapplied  OT Visit Diagnosis: Other abnormalities of gait and mobility (R26.89);Repeated falls (R29.6);Muscle weakness (generalized) (M62.81);Pain Pain - Right/Left: Left Pain - part of body: Hip;Leg  Time: 5916-3846 OT Time Calculation (min): 24 min Charges:  OT General Charges $OT Visit: 1 Visit OT Evaluation $OT Eval Low Complexity: 1 Low OT Treatments $Self Care/Home Management : 8-22 mins  Jeni Salles, MPH, MS, OTR/L ascom [phone] 12/03/17, 11:03 AM

## 2017-12-03 NOTE — Plan of Care (Signed)
  Problem: Education: Goal: Knowledge of General Education information will improve Description Including pain rating scale, medication(s)/side effects and non-pharmacologic comfort measures Outcome: Progressing   

## 2017-12-03 NOTE — Progress Notes (Signed)
Physical Therapy Treatment Patient Details Name: Kristopher [name] MRN: [id number] DOB: January 06, 1961 Today's Date: 12/03/2017    History of Present Illness Pt is a pleasant 57 y.o male s/p total hip replacement with anterior approach. Pt with PMH significant for HTN.     PT Comments    Pt with continued progress tolerating increase ambulation, improved balance during sit to stand transfers, and increased repetitions of exercises. Pt with very little pain and ambulating confidently, thus requiring reminder to adhere to 50% WBing even if he feels very confident in his ambulation. No physical assist required for sit to supine or sit to stand demonstrating improved mobility and functional strength. Pt continues to remain most appropriate for home with HHPT upon discharge.   Follow Up Recommendations  Home health PT     Equipment Recommendations  Rolling walker with 5" wheels    Recommendations for Other Services       Precautions / Restrictions Precautions Precautions: Fall;Anterior Hip Precaution Booklet Issued: Yes (comment) Restrictions Weight Bearing Restrictions: Yes LLE Weight Bearing: Partial weight bearing LLE Partial Weight Bearing Percentage or Pounds: 50% Other Position/Activity Restrictions: 50% weight bearing on L LE for 4 weeks per surgeon     Mobility  Bed Mobility Overal bed mobility: Modified Independent Bed Mobility: Sit to Supine       Sit to supine: HOB elevated;Modified independent (Device/Increase time)   General bed mobility comments: increased effort required for sit to supine however no physical assist required, pt able to bring L LE onto bed independently   Transfers Overall transfer level: Modified independent Equipment used: Rolling walker (2 wheeled) Transfers: Sit to/from Stand Sit to Stand: Supervision         General transfer comment: no physical assist required with sit to stand, increased effort required, pt steady upon standing no  reports of feeling off balance and no visible balance impairments   Ambulation/Gait Ambulation/Gait assistance: Min guard Gait Distance (Feet): 180 Feet Assistive device: Rolling walker (2 wheeled)       General Gait Details: Pt tolerating increased ambulation this afternoon well, mild increase in pain to 4/10, adhering to 50% WBing well but VC for reminder due to limited pain and a visible decrease in UE reliance with RW. No LOB, equal steps, good sequencing with RW.    Stairs             Wheelchair Mobility    Modified Rankin (Stroke Patients Only)       Balance Overall balance assessment: No apparent balance deficits (not formally assessed)                                          Cognition Arousal/Alertness: Awake/alert Behavior During Therapy: WFL for tasks assessed/performed Overall Cognitive Status: Within Functional Limits for tasks assessed                                        Exercises Total Joint Exercises Ankle Circles/Pumps: AROM;Both;Strengthening;15 reps Quad Sets: AROM;Left;Strengthening;15 reps Gluteal Sets: AROM;Strengthening;Both;15 reps Hip ABduction/ADduction: AROM;Strengthening;Left;15 reps Long Arc Quad: AROM;Strengthening;Left;10 reps    General Comments        Pertinent Vitals/Pain Pain Assessment: 0-10 Pain Score: 0-No pain(4/10 end of session) Pain Location: L hip Pain Descriptors / Indicators: Aching;Sore Pain Intervention(s): Limited activity within patient's  tolerance;Monitored during session;Premedicated before session;Repositioned    Home Living                      Prior Function            PT Goals (current goals can now be found in the care plan section) Progress towards PT goals: Progressing toward goals    Frequency    BID      PT Plan Current plan remains appropriate    Co-evaluation              AM-PAC PT "6 Clicks" Daily Activity  Outcome Measure   Difficulty turning over in bed (including adjusting bedclothes, sheets and blankets)?: None Difficulty moving from lying on back to sitting on the side of the bed? : None Difficulty sitting down on and standing up from a chair with arms (e.g., wheelchair, bedside commode, etc,.)?: None Help needed moving to and from a bed to chair (including a wheelchair)?: A Little Help needed walking in hospital room?: A Little Help needed climbing 3-5 steps with a railing? : A Lot 6 Click Score: 20    End of Session Equipment Utilized During Treatment: Gait belt Activity Tolerance: Patient tolerated treatment well Patient left: with call bell/phone within reach;with SCD's reapplied;in bed;with bed alarm set, with towel roll under ankles   PT Visit Diagnosis: Other abnormalities of gait and mobility (R26.89);Muscle weakness (generalized) (M62.81);Pain Pain - Right/Left: Left Pain - part of body: Hip     Time: 4098-1191 PT Time Calculation (min) (ACUTE ONLY): 23 min  Charges:                        Ernie Avena, SPT, SPT 12/03/2017, 3:12 PM

## 2017-12-03 NOTE — Progress Notes (Signed)
Physical Therapy Treatment Patient Details Name: Kristopher [name] MRN: [id number] DOB: 12-19-1960 Today's Date: 12/03/2017    History of Present Illness Pt is a pleasant 57 y.o male s/p total hip replacement with anterior approach. Pt with PMH significant for HTN.     PT Comments    Pt with excellent effort this session demonstrating improved independence with bed mobility, transfers, and ambulation. Pt ambulating 80 feet with improved ability to perform up to LLE 50% WBing, step through gait, and only mild increase in pain. Good demonstration of strength during exercises this morning with no need for active assist. Pt HR remaining in mid 90's reaching 105 with exertion and SpO2 in high 90's t/o. Pt remains most appropriate to return home with HHPT when cleared for d/c.    Follow Up Recommendations  Home health PT     Equipment Recommendations       Recommendations for Other Services       Precautions / Restrictions Precautions Precautions: Fall Restrictions Weight Bearing Restrictions: Yes LLE Weight Bearing: Partial weight bearing LLE Partial Weight Bearing Percentage or Pounds: 50% Other Position/Activity Restrictions: 50% weight bearing on L LE for 4 weeks per surgeon     Mobility  Bed Mobility Overal bed mobility: Modified Independent       Supine to sit: HOB elevated     General bed mobility comments: pt with excellent effort, no physical assist required, increased effort noted but able to perform transfer with good speed  Transfers Overall transfer level: Modified independent Equipment used: Rolling walker (2 wheeled) Transfers: Sit to/from Stand Sit to Stand: Supervision         General transfer comment: pt able to go sit to stand with lower bed height this morning, no physical assist required, pt performing with minimal difficulty, subjective reports of mild off balance noted upon initial standing but this subsiding withing after brief period of  standing  Ambulation/Gait Ambulation/Gait assistance: Min guard Gait Distance (Feet): 80 Feet Assistive device: Rolling walker (2 wheeled)       General Gait Details: Pt adhereing well to 50% precautions, demonstrating improved ambulation with consistent 50% WBing t/o 80 feet, no hesitation, heavy UE reliance for WBing precautions, no sharp increase in pain, steady t/o with no balance impairments noted   Stairs             Wheelchair Mobility    Modified Rankin (Stroke Patients Only)       Balance Overall balance assessment: Mild deficits observed, not formally tested(pt reporting off balance upon initial standing but no deficits noted and steady t/o session)                                          Cognition Arousal/Alertness: Awake/alert Behavior During Therapy: WFL for tasks assessed/performed Overall Cognitive Status: Within Functional Limits for tasks assessed                                        Exercises Total Joint Exercises Ankle Circles/Pumps: AROM;Both;10 reps;Supine;Strengthening Quad Sets: AROM;Left;10 reps;Supine;Strengthening Gluteal Sets: AROM;Left;10 reps;Supine;Strengthening Towel Squeeze: AROM;Left;10 reps;Supine;Strengthening Short Arc Quad: AROM;Strengthening;Left;10 reps;Supine Heel Slides: AROM;Strengthening;Left;10 reps;Supine Hip ABduction/ADduction: AROM;Strengthening;Left;10 reps;Supine    General Comments        Pertinent Vitals/Pain Pain Assessment: 0-10 Pain Score: 2 (increasing to  4/10 with ambulation) Pain Location: L hip Pain Descriptors / Indicators: Aching;Sore Pain Intervention(s): Limited activity within patient's tolerance;Monitored during session;Repositioned    Home Living                      Prior Function            PT Goals (current goals can now be found in the care plan section) Progress towards PT goals: Progressing toward goals    Frequency     BID      PT Plan      Co-evaluation              AM-PAC PT "6 Clicks" Daily Activity  Outcome Measure  Difficulty turning over in bed (including adjusting bedclothes, sheets and blankets)?: None Difficulty moving from lying on back to sitting on the side of the bed? : None Difficulty sitting down on and standing up from a chair with arms (e.g., wheelchair, bedside commode, etc,.)?: None Help needed moving to and from a bed to chair (including a wheelchair)?: A Little Help needed walking in hospital room?: A Little Help needed climbing 3-5 steps with a railing? : A Lot 6 Click Score: 20    End of Session Equipment Utilized During Treatment: Gait belt Activity Tolerance: Patient tolerated treatment well Patient left: in chair;with call bell/phone within reach;with chair alarm set;with SCD's reapplied;with family/visitor present   PT Visit Diagnosis: Other abnormalities of gait and mobility (R26.89);Muscle weakness (generalized) (M62.81);Pain Pain - Right/Left: Left Pain - part of body: Hip     Time: 7943-2761 PT Time Calculation (min) (ACUTE ONLY): 24 min  Charges:                        Ernie Avena, SPT 12/03/2017, 9:14 AM

## 2017-12-04 LAB — CBC
HCT: 33.7 % — ABNORMAL LOW (ref 40.0–52.0)
Hemoglobin: 11.8 g/dL — ABNORMAL LOW (ref 13.0–18.0)
MCH: 31.6 pg (ref 26.0–34.0)
MCHC: 35.1 g/dL (ref 32.0–36.0)
MCV: 90.2 fL (ref 80.0–100.0)
Platelets: 139 10*3/uL — ABNORMAL LOW (ref 150–440)
RBC: 3.73 MIL/uL — ABNORMAL LOW (ref 4.40–5.90)
RDW: 14.6 % — ABNORMAL HIGH (ref 11.5–14.5)
WBC: 10.7 10*3/uL — ABNORMAL HIGH (ref 3.8–10.6)

## 2017-12-04 LAB — BASIC METABOLIC PANEL
Anion gap: 6 (ref 5–15)
BUN: 15 mg/dL (ref 6–20)
CO2: 29 mmol/L (ref 22–32)
Calcium: 9.1 mg/dL (ref 8.9–10.3)
Chloride: 104 mmol/L (ref 98–111)
Creatinine, Ser: 1.11 mg/dL (ref 0.61–1.24)
GFR calc Af Amer: >60 mL/min (ref >60)
GFR calc non Af Amer: >60 mL/min (ref >60)
Glucose, Bld: 266 mg/dL — ABNORMAL HIGH (ref 70–99)
Potassium: 4 mmol/L (ref 3.5–5.1)
Sodium: 139 mmol/L (ref 135–145)

## 2017-12-04 MED ORDER — ENOXAPARIN SODIUM 40 MG/0.4ML ~~LOC~~ SOLN: 40.0000 mg | SUBCUTANEOUS | 0 refills | Status: DC

## 2017-12-04 MED ORDER — DOCUSATE SODIUM 100 MG PO CAPS: 100.0000 mg | ORAL_CAPSULE | Freq: Two times a day (BID) | ORAL | 0 refills | Status: DC

## 2017-12-04 MED ORDER — OXYCODONE HCL 5 MG PO TABS: 5.0000 mg | ORAL_TABLET | Freq: Four times a day (QID) | ORAL | 0 refills | Status: DC | PRN

## 2017-12-04 NOTE — Progress Notes (Signed)
   Subjective: 2 Days Post-Op Procedure(s) (LRB): TOTAL HIP ARTHROPLASTY ANTERIOR APPROACH (Left) Patient reports pain as mild.   Patient is well, and has had no acute complaints or problems Denies any CP, SOB, ABD pain. We will continue therapy today.  Plan is to go Home after hospital stay.  Objective: Vital signs in last 24 hours: Temp:  [97.6 F (36.4 C)-98.5 F (36.9 C)] 98.3 F (36.8 C) (10/02 2353) Pulse Rate:  [72-88] 88 (10/02 2353) Resp:  [15-18] 15 (10/02 2353) BP: (104-140)/(59-89) 140/82 (10/02 2353) SpO2:  [94 %-98 %] 96 % (10/02 2353)  Intake/Output from previous day: 10/02 0701 - 10/03 0700 In: 1108.1 [P.O.:240; I.V.:868.1] Out: 2125 [Urine:2125] Intake/Output this shift: No intake/output data recorded.  Recent Labs    12/02/17 0914 12/04/17 0522  HGB 14.1 11.8*   Recent Labs    12/02/17 0914 12/04/17 0522  WBC 6.3 10.7*  RBC 4.57 3.73*  HCT 40.6 33.7*  PLT 160 139*   Recent Labs    12/02/17 0914 12/04/17 0522  NA  --  139  K  --  4.0  CL  --  104  CO2  --  29  BUN  --  15  CREATININE 0.80 1.11  GLUCOSE  --  266*  CALCIUM  --  9.1   No results for input(s): LABPT, INR in the last 72 hours.  EXAM General - Patient is Alert, Appropriate and Oriented Extremity - Neurovascular intact Sensation intact distally Intact pulses distally Dorsiflexion/Plantar flexion intact No cellulitis present Compartment soft Dressing - dressing C/D/I and scant drainage Motor Function - intact, moving foot and toes well on exam.   Past Medical History:  Diagnosis Date  . Allergy   . Arthritis    left foot  . Chronic back pain    Four rods in back  . Hypertension   . Migraines    daily    Assessment/Plan:   2 Days Post-Op Procedure(s) (LRB): TOTAL HIP ARTHROPLASTY ANTERIOR APPROACH (Left) Active Problems:   S/P hip replacement  Estimated body mass index is 34.15 kg/m as calculated from the following:   Height as of this encounter: 5\' 10"   (1.778 m).   Weight as of this encounter: 108 kg. Advance diet Up with therapy, 50 % weight bearing LLE Pain well controlled Labs stable VSS CM to assist with discharge to home with [name] on discharge to home today pending BM   DVT Prophylaxis - Lovenox, TED hose and SCDs 50% Weight-Bearing to Left leg   T. Rachelle Hora, PA-C [place] 12/04/2017, 7:27 AM

## 2017-12-04 NOTE — Discharge Summary (Signed)
Physician Discharge Summary  Patient ID: Kristopher [name] MRN: [id number] DOB/AGE: 1960/08/08 57 y.o.  Admit date: 12/02/2017 Discharge date: 12/04/2017  Admission Diagnoses:  primary osteoarthritis of left hip   Discharge Diagnoses: Patient Active Problem List   Diagnosis Date Noted  . S/P hip replacement 12/02/2017  . Hip arthritis 10/01/2017  . Aortic atherosclerosis ([place]) 08/25/2017  . Left-sided weakness 08/13/2017  . Musculoskeletal pain, chronic 04/03/2017  . Tobacco abuse 06/24/2016  . GERD (gastroesophageal reflux disease) 06/24/2016  . Special screening for malignant neoplasms, colon   . Benign neoplasm of ascending colon   . Polyp of sigmoid colon   . Rectal polyp   . Benign hypertensive renal disease 01/22/2016  . Migraine 01/22/2016  . Chronic pain syndrome 01/11/2016  . Chronic sacroiliac joint pain (Bilateral) (L>R) 05/31/2015  . Allergy history, anesthetic (Unconfirmed allergy to Lidocaine) 05/31/2015  . Chronic hip pain (Left) 05/31/2015  . Lumbar facet syndrome (Location of Primary Source of Pain) (Bilateral) (L>R) 05/31/2015  . Chronic lower extremity pain (Location of Secondary source of pain) (Left) 05/31/2015  . Chronic upper extremity pain (Location of Tertiary source of pain) (Bilateral) (L>R) 05/31/2015  . Greater occipital neuralgia (Right) 05/31/2015  . Retrolisthesis of L5-S1 02/06/2015  . Cervical disc herniation (C4-5 and C5-6) 02/06/2015  . Lumbar disc herniation (L5-S1) 02/06/2015  . Hypokalemia 02/01/2015  . Cervical spinal stenosis (C4-5) 01/06/2015  . Cervical foraminal stenosis (Bilateral C5-6) 01/06/2015  . Chronic low back pain (Location of Primary Source of Pain) (Bilateral) (L>R) 01/05/2015  . Lumbar spondylosis 01/05/2015  . Chronic lumbar radicular pain (Location of Secondary source of pain) (S1 dermatomal) (Left) 01/05/2015  . 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) 01/05/2015  . Chronic neck pain  (posterior midline) (Bilateral) (L>R) 01/05/2015  . Cervical spondylosis 01/05/2015  . Chronic cervical radicular pain (Location of Tertiary source of pain) (Bilateral) (C5/C6 dermatome) (L>R) 01/05/2015  . Long term current use of opiate analgesic 01/05/2015  . Long term prescription opiate use 01/05/2015  . Opiate use (60 MME/Day) 01/05/2015  . Opioid dependence ([place]) 01/05/2015  . Encounter for therapeutic drug level monitoring 01/05/2015    Past Medical History:  Diagnosis Date  . Allergy   . Arthritis    left foot  . Chronic back pain    Four rods in back  . Hypertension   . Migraines    daily     Transfusion: none   Consultants (if any):   Discharged Condition: Improved  Hospital Course: OWYN RAULSTON is an 57 y.o. male who was admitted 12/02/2017 with a diagnosis of <principal problem not specified> and went to the operating room on 12/02/2017 and underwent the above named procedures.    Surgeries: Procedure(s): TOTAL HIP ARTHROPLASTY ANTERIOR APPROACH on 12/02/2017 Patient tolerated the surgery well. Taken to PACU where she was stabilized and then transferred to the orthopedic floor.  Started on Lovenox 40 mg  q 24 hrs. Foot pumps applied bilaterally at 80 mm. Heels elevated on bed with rolled towels. No evidence of DVT. Negative [place]. Physical therapy started on day #1 for gait training and transfer. OT started day #1 for ADL and assisted devices.  Patient's foley was d/c on day #1. Patient's IV was d/c on day #2.  On post op day #2 patient was stable and ready for discharge to home with HHPT.   He was given perioperative antibiotics:  Anti-infectives (From admission, onward)   Start     Dose/Rate Route Frequency Ordered  Stop   12/02/17 1600  clindamycin (CLEOCIN) IVPB 900 mg     900 mg 100 mL/hr over 30 Minutes Intravenous Every 6 hours 12/02/17 1426 12/03/17 0530   12/02/17 0834  clindamycin (CLEOCIN) 900 MG/50ML IVPB    Note to Pharmacy:  Alysia Penna   :  cabinet override      12/02/17 0834 12/02/17 1022   12/02/17 0030  clindamycin (CLEOCIN) IVPB 900 mg     900 mg 100 mL/hr over 30 Minutes Intravenous  Once 12/02/17 0029 12/02/17 1037    .  He was given sequential compression devices, early ambulation, and Lovenox for DVT prophylaxis.  He benefited maximally from the hospital stay and there were no complications.    Recent vital signs:  Vitals:   12/04/17 0822 12/04/17 1003  BP: 125/83 121/65  Pulse: 78 70  Resp:    Temp: 98.6 F (37 C)   SpO2: 95%     Recent laboratory studies:  Lab Results  Component Value Date   HGB 11.8 (L) 12/04/2017   HGB 14.1 12/02/2017   HGB 15.0 10/21/2017   Lab Results  Component Value Date   WBC 10.7 (H) 12/04/2017   PLT 139 (L) 12/04/2017   Lab Results  Component Value Date   INR 1.06 10/21/2017   Lab Results  Component Value Date   NA 139 12/04/2017   K 4.0 12/04/2017   CL 104 12/04/2017   CO2 29 12/04/2017   BUN 15 12/04/2017   CREATININE 1.11 12/04/2017   GLUCOSE 266 (H) 12/04/2017    Discharge Medications:   Allergies as of 12/04/2017      Reactions   Aspirin Anaphylaxis   Epinephrine Anaphylaxis   Lidocaine Anaphylaxis   Novocain [procaine] Anaphylaxis   Penicillins Anaphylaxis   Has patient had a PCN reaction causing immediate rash, facial/tongue/throat swelling, SOB or lightheadedness with hypotension: Yes Has patient had a PCN reaction causing severe rash involving mucus membranes or skin necrosis: No Has patient had a PCN reaction that required hospitalization: Yes Has patient had a PCN reaction occurring within the last 10 years: No If all of the above answers are "NO", then may proceed with Cephalosporin use.   Strawberry Extract Anaphylaxis   Shellfish Allergy Nausea And Vomiting      Medication List    STOP taking these medications   meloxicam 15 MG tablet Commonly known as:  MOBIC     TAKE these medications   amLODipine-valsartan 10-320 MG  tablet Commonly known as:  EXFORGE Take 1 tablet by mouth daily.   baclofen 20 MG tablet Commonly known as:  LIORESAL Take 1 tablet (20 mg total) by mouth at bedtime as needed for muscle spasms.   diphenhydrAMINE 25 MG tablet Commonly known as:  BENADRYL Take 50 mg by mouth 2 (two) times daily.   docusate sodium 100 MG capsule Commonly known as:  COLACE Take 1 capsule (100 mg total) by mouth 2 (two) times daily.   doxazosin 4 MG tablet Commonly known as:  CARDURA Take 1 tablet (4 mg total) by mouth daily.   enoxaparin 40 MG/0.4ML injection Commonly known as:  LOVENOX Inject 0.4 mLs (40 mg total) into the skin daily for 14 days. Start taking on:  12/05/2017   esomeprazole 40 MG capsule Commonly known as:  NEXIUM Take 1 capsule (40 mg total) by mouth daily.   hydrALAZINE 25 MG tablet Commonly known as:  APRESOLINE Take 25 mg by mouth 3 (three) times daily. Take with 50  mg to equal 75 mg 3 times daily What changed:  Another medication with the same name was changed. Make sure you understand how and when to take each.   hydrALAZINE 50 MG tablet Commonly known as:  APRESOLINE Take 1 tablet (50 mg total) by mouth 3 (three) times daily. What changed:  additional instructions   hydrochlorothiazide 50 MG tablet Commonly known as:  HYDRODIURIL Take 1 tablet (50 mg total) by mouth daily. What changed:  when to take this   metoprolol succinate 100 MG 24 hr tablet Commonly known as:  TOPROL-XL Take 100 mg by mouth at bedtime. Take with 25 mg to equal 125 mg once daily What changed:  Another medication with the same name was changed. Make sure you understand how and when to take each.   metoprolol succinate 25 MG 24 hr tablet Commonly known as:  TOPROL-XL Take 25 mg pill along with 100 mg pill to total 125 mg metoprolol daily. What changed:    how much to take  how to take this  when to take this  additional instructions   NONFORMULARY OR COMPOUNDED ITEM 10%  Ketamine/2% Cyclobenzaprine/6% Gabapentin Cream Sig: 1-2 ml to affected area 3-4 times/day. Amount: 240 GM   oxyCODONE 5 MG immediate release tablet Commonly known as:  Oxy IR/ROXICODONE Take 1-2 tablets (5-10 mg total) by mouth every 6 (six) hours as needed for severe pain. What changed:    medication strength  how much to take  reasons to take this   potassium chloride 20 MEQ packet Commonly known as:  KLOR-CON Take 20 mEq by mouth daily.   SUMAtriptan 100 MG tablet Commonly known as:  IMITREX Take 1 tablet earliest onset of headache.  May repeat once after 2 hours if headache persists or recurs.  Do not exceed 2 tablets in 24 hrs            Durable Medical Equipment  (From admission, onward)         Start     Ordered   12/02/17 1427  DME Bedside commode  Once    Question:  Patient needs a bedside commode to treat with the following condition  Answer:  Status post total hip replacement, left   12/02/17 1426   12/02/17 1427  DME 3 n 1  Once     12/02/17 1426   12/02/17 1427  DME Walker rolling  Once    Question:  Patient needs a walker to treat with the following condition  Answer:  Status post total hip replacement, left   12/02/17 1426          Diagnostic Studies: Dg Hip Operative Unilat W Or W/o Pelvis Left  Result Date: 12/02/2017 CLINICAL DATA:  57 year old male with left hip replacement. EXAM: OPERATIVE 2 HIP (WITH PELVIS IF PERFORMED) left hip VIEWS 8.0 mGy. TECHNIQUE: Fluoroscopic spot image(s) were submitted for interpretation post-operatively. COMPARISON:  08/14/2017. FINDINGS: Post total left hip replacement. This appears in satisfactory position without complication noted on limited C-arm imaging. IMPRESSION: Post total left hip replacement. Electronically Signed   By: Genia Del M.D.   On: 12/02/2017 12:06   Dg Hip Unilat W Or W/o Pelvis 2-3 Views Left  Result Date: 12/02/2017 CLINICAL DATA:  Post LEFT hip arthroplasty EXAM: DG HIP (WITH OR  WITHOUT PELVIS) 2-3V LEFT COMPARISON:  Portable exam at 1226 hrs compared to 08/14/2017 FINDINGS: Interval placement of a LEFT hip prosthesis. Faint linear lucency traverses the LEFT superior pubic ramus, potentially artifact though  a nondisplaced fracture is not completely excluded. No additional fracture, dislocation, or bone destruction. Prior L5-S1 fusion. IMPRESSION: New LEFT hip prosthesis without dislocation. Faint linear lucency traverses the LEFT superior pubic ramus, potentially an artifact though a nondisplaced fracture is not completely excluded; follow-up dedicated nonportable pelvic radiograph recommended to evaluate. Electronically Signed   By: Lavonia Dana M.D.   On: 12/02/2017 12:54    Disposition:     Follow-up Information    Duanne Guess, PA-C Follow up in 2 week(s).   Specialties:  Orthopedic Surgery, Emergency Medicine Contact information: [place] [address] [phone]            Signed: Feliberto Gottron 12/04/2017, 11:03 AM

## 2017-12-04 NOTE — Progress Notes (Signed)
Physical Therapy Treatment Patient Details Name: Kristopher [name] MRN: [id number] DOB: [birth year] Today's Date: 12/04/2017    History of Present Illness Pt is a pleasant 57 y.o male s/p total hip replacement with anterior approach. Pt with PMH significant for HTN.     PT Comments    Pt progressing well with increased ambulation to 250 feet and 4 stairs negotiated this morning. Continued independence noted with bed mobility and sit to stand transfers. Pt with low pain levels not limiting treatment. Upon initial ambulation limited heel strike noted however able to correct and maintained with VC. Pt adhering well to 50% WBing and taking near equal steps with VC. First attempt with stairs performed this morning with initial VC for sequencing and pt able to negotiate safely with no physical assist. Step to pattern on stairs with use of both hands rails for 50% WBing and safety. Pt is still appropriate to return to home with HHPT upon discharge.   Follow Up Recommendations  Home health PT     Equipment Recommendations  Rolling walker with 5" wheels    Recommendations for Other Services       Precautions / Restrictions Precautions Precautions: Fall;Anterior Hip Precaution Booklet Issued: Yes (comment) Restrictions Weight Bearing Restrictions: Yes LLE Weight Bearing: Partial weight bearing LLE Partial Weight Bearing Percentage or Pounds: 50 Other Position/Activity Restrictions: 50% weight bearing on L LE for 4 weeks per surgeon     Mobility  Bed Mobility Overal bed mobility: Independent Bed Mobility: Supine to Sit     Supine to sit: HOB elevated;Modified independent (Device/Increase time)     General bed mobility comments: decreased effort required with basic bed mobility, pt requiring mild increased time to bring L LE off bed but no physical assist required  Transfers Overall transfer level: Modified independent Equipment used: Rolling walker (2 wheeled) Transfers: Sit to/from  Stand Sit to Stand: Modified independent (Device/Increase time)         General transfer comment: decreased time required for sit to stand, no physical assist required, no LOB or instability upon standing  Ambulation/Gait Ambulation/Gait assistance: Min guard Gait Distance (Feet): 250 Feet Assistive device: Rolling walker (2 wheeled)       General Gait Details: good effort with increased ambulation distance, no increase in pain, no instability noted, pt adhering well to 50% WBing, VC to increase R LE step length and demonstrating improved stride length t/o rest of ambulation, decreased heel strike on L initially with correction following VC   Stairs Stairs: Yes Stairs assistance: Min guard Stair Management: Two rails Number of Stairs: 4 General stair comments: step to strategy utilized, no physical assist required, min guard for safety, pt negotiating stairs safely with 2 rails   Wheelchair Mobility    Modified Rankin (Stroke Patients Only)       Balance Overall balance assessment: No apparent balance deficits (not formally assessed)                                          Cognition Arousal/Alertness: Awake/alert Behavior During Therapy: WFL for tasks assessed/performed Overall Cognitive Status: Within Functional Limits for tasks assessed                                        Exercises Total Joint Exercises Ankle Circles/Pumps:  AROM;Both;Strengthening;15 reps Quad Sets: AROM;Left;Strengthening;15 reps Gluteal Sets: AROM;Strengthening;Both;15 reps Towel Squeeze: AROM;Left;Strengthening;15 reps;Supine Short Arc Quad: AROM;Strengthening;Left;15 reps;Supine Heel Slides: AROM;Strengthening;Left;10 reps;Supine Hip ABduction/ADduction: AROM;Strengthening;Left;15 reps    General Comments        Pertinent Vitals/Pain Pain Assessment: 0-10 Pain Score: 3  Pain Location: L hip Pain Descriptors / Indicators: Aching;Sore Pain  Intervention(s): Monitored during session;Premedicated before session;Limited activity within patient's tolerance;Repositioned    Home Living                      Prior Function            PT Goals (current goals can now be found in the care plan section) Progress towards PT goals: Progressing toward goals    Frequency    BID      PT Plan Current plan remains appropriate    Co-evaluation              AM-PAC PT "6 Clicks" Daily Activity  Outcome Measure  Difficulty turning over in bed (including adjusting bedclothes, sheets and blankets)?: None Difficulty moving from lying on back to sitting on the side of the bed? : None Difficulty sitting down on and standing up from a chair with arms (e.g., wheelchair, bedside commode, etc,.)?: None Help needed moving to and from a bed to chair (including a wheelchair)?: None Help needed walking in hospital room?: A Little Help needed climbing 3-5 steps with a railing? : A Little 6 Click Score: 22    End of Session Equipment Utilized During Treatment: Gait belt Activity Tolerance: Patient tolerated treatment well Patient left: with call bell/phone within reach;with SCD's reapplied;in chair;with chair alarm set Nurse Communication: Mobility status PT Visit Diagnosis: Other abnormalities of gait and mobility (R26.89);Muscle weakness (generalized) (M62.81);Pain Pain - Right/Left: Left Pain - part of body: Hip     Time: 1610-9604 PT Time Calculation (min) (ACUTE ONLY): 25 min  Charges:                        Ernie Avena, SPT 12/04/2017, 10:01 AM

## 2017-12-04 NOTE — Discharge Instructions (Signed)

## 2017-12-04 NOTE — Progress Notes (Signed)
Discharge instructions and prescriptions given to pt. IV removed. Pt has walker and BSC at home. Pt getting dressed and will discharge home with wife.

## 2017-12-05 ENCOUNTER — Other Ambulatory Visit: Payer: Self-pay | Admitting: Family Medicine

## 2017-12-05 DIAGNOSIS — M5020 Other cervical disc displacement, unspecified cervical region: Secondary | ICD-10-CM | POA: Diagnosis not present

## 2017-12-05 DIAGNOSIS — M19072 Primary osteoarthritis, left ankle and foot: Secondary | ICD-10-CM | POA: Diagnosis not present

## 2017-12-05 DIAGNOSIS — Z9181 History of falling: Secondary | ICD-10-CM | POA: Diagnosis not present

## 2017-12-05 DIAGNOSIS — G8194 Hemiplegia, unspecified affecting left nondominant side: Secondary | ICD-10-CM | POA: Diagnosis not present

## 2017-12-05 DIAGNOSIS — Z471 Aftercare following joint replacement surgery: Secondary | ICD-10-CM | POA: Diagnosis not present

## 2017-12-05 DIAGNOSIS — Z96642 Presence of left artificial hip joint: Secondary | ICD-10-CM | POA: Diagnosis not present

## 2017-12-05 DIAGNOSIS — I10 Essential (primary) hypertension: Secondary | ICD-10-CM | POA: Diagnosis not present

## 2017-12-05 DIAGNOSIS — Z85038 Personal history of other malignant neoplasm of large intestine: Secondary | ICD-10-CM | POA: Diagnosis not present

## 2017-12-05 DIAGNOSIS — M48061 Spinal stenosis, lumbar region without neurogenic claudication: Secondary | ICD-10-CM | POA: Diagnosis not present

## 2017-12-05 DIAGNOSIS — M5126 Other intervertebral disc displacement, lumbar region: Secondary | ICD-10-CM | POA: Diagnosis not present

## 2017-12-05 DIAGNOSIS — M461 Sacroiliitis, not elsewhere classified: Secondary | ICD-10-CM | POA: Diagnosis not present

## 2017-12-05 DIAGNOSIS — M4802 Spinal stenosis, cervical region: Secondary | ICD-10-CM | POA: Diagnosis not present

## 2017-12-05 DIAGNOSIS — I700 Atherosclerosis of aorta: Secondary | ICD-10-CM | POA: Diagnosis not present

## 2017-12-05 DIAGNOSIS — M961 Postlaminectomy syndrome, not elsewhere classified: Secondary | ICD-10-CM | POA: Diagnosis not present

## 2017-12-05 DIAGNOSIS — M47816 Spondylosis without myelopathy or radiculopathy, lumbar region: Secondary | ICD-10-CM | POA: Diagnosis not present

## 2017-12-05 DIAGNOSIS — G894 Chronic pain syndrome: Secondary | ICD-10-CM | POA: Diagnosis not present

## 2017-12-05 DIAGNOSIS — G43909 Migraine, unspecified, not intractable, without status migrainosus: Secondary | ICD-10-CM | POA: Diagnosis not present

## 2017-12-08 LAB — SURGICAL PATHOLOGY

## 2017-12-09 NOTE — Op Note (Signed)
12/02/2017  8:12 AM  PATIENT:  Kristopher [name]  57 y.o. male  PRE-OPERATIVE DIAGNOSIS:  primary osteoarthritis of left hip  POST-OPERATIVE DIAGNOSIS:  primary osteoarthritis of left hip  PROCEDURE:  Procedure(s): TOTAL HIP ARTHROPLASTY ANTERIOR APPROACH (Left)  SURGEON: Laurene Footman, MD  ASSISTANTS: none   ANESTHESIA:   spinal  EBL:  No intake/output data recorded.  BLOOD ADMINISTERED:none  DRAINS: none   LOCAL MEDICATIONS USED:  MARCAINE     SPECIMEN:  Source of Specimen:  left femoral head  DISPOSITION OF SPECIMEN:  PATHOLOGY  COUNTS:  YES  TOURNIQUET:  * No tourniquets in log *  IMPLANTS: Medacta 56 mm Mpact DM with liner, S 28 mm ceramic head, 5 lateralized AMIS stem  DICTATION: .Dragon Dictation   The patient was brought to the operating room and after spinal anesthesia was obtained patient was placed on the operative table with the ipsilateral foot into the Medacta attachment, contralateral leg on a well-padded table. C-arm was brought in and preop template x-ray taken. After prepping and draping in usual sterile fashion appropriate patient identification and timeout procedures were completed. Anterior approach to the hip was obtained and centered over the greater trochanter and TFL muscle. The subcutaneous tissue was incised hemostasis being achieved by electrocautery. TFL fascia was incised and the muscle retracted laterally deep retractor placed. The lateral femoral circumflex vessels were identified and ligated. The anterior capsule was exposed and a capsulotomy performed. The neck was identified and a femoral neck cut carried out with a saw. The head was removed without difficulty and showed sclerotic femoral head and acetabulum. Reaming was carried out to 56 mm and a 56 mm cup trial gave appropriate tightness to the acetabular component a 56 DM cup was impacted into position. The leg was then externally rotated and ischiofemoral and pubofemoral releases carried out.  The femur was sequentially broached to a size 5, size 5 with lateralized neck and S head trials were placed and the final components chosen. The 5 lat stem was inserted along with a 56 28 mm head and 56 mm liner. The hip was reduced and was stable the wound was thoroughly irrigated with fibrillar placed along the posterior capsule and medial neck. The deep fascia ws closed using a heavy Quill after infiltration of 30 cc of quarter percent Sensorcaine with epinephrine.3-0 V-loc to close the skin with skin staples. Xeroform 4 x 4's ABDs and tape patient was sent to recovery in stable condition.   PLAN OF CARE: Admit to inpatient

## 2017-12-10 ENCOUNTER — Other Ambulatory Visit: Payer: Self-pay

## 2017-12-10 DIAGNOSIS — Z85038 Personal history of other malignant neoplasm of large intestine: Secondary | ICD-10-CM | POA: Diagnosis not present

## 2017-12-10 DIAGNOSIS — M5020 Other cervical disc displacement, unspecified cervical region: Secondary | ICD-10-CM | POA: Diagnosis not present

## 2017-12-10 DIAGNOSIS — M961 Postlaminectomy syndrome, not elsewhere classified: Secondary | ICD-10-CM | POA: Diagnosis not present

## 2017-12-10 DIAGNOSIS — G894 Chronic pain syndrome: Secondary | ICD-10-CM | POA: Diagnosis not present

## 2017-12-10 DIAGNOSIS — I700 Atherosclerosis of aorta: Secondary | ICD-10-CM | POA: Diagnosis not present

## 2017-12-10 DIAGNOSIS — Z9181 History of falling: Secondary | ICD-10-CM | POA: Diagnosis not present

## 2017-12-10 DIAGNOSIS — M19072 Primary osteoarthritis, left ankle and foot: Secondary | ICD-10-CM | POA: Diagnosis not present

## 2017-12-10 DIAGNOSIS — M4802 Spinal stenosis, cervical region: Secondary | ICD-10-CM | POA: Diagnosis not present

## 2017-12-10 DIAGNOSIS — Z96642 Presence of left artificial hip joint: Secondary | ICD-10-CM | POA: Diagnosis not present

## 2017-12-10 DIAGNOSIS — M461 Sacroiliitis, not elsewhere classified: Secondary | ICD-10-CM | POA: Diagnosis not present

## 2017-12-10 DIAGNOSIS — G43909 Migraine, unspecified, not intractable, without status migrainosus: Secondary | ICD-10-CM | POA: Diagnosis not present

## 2017-12-10 DIAGNOSIS — M5126 Other intervertebral disc displacement, lumbar region: Secondary | ICD-10-CM | POA: Diagnosis not present

## 2017-12-10 DIAGNOSIS — M47816 Spondylosis without myelopathy or radiculopathy, lumbar region: Secondary | ICD-10-CM | POA: Diagnosis not present

## 2017-12-10 DIAGNOSIS — G8194 Hemiplegia, unspecified affecting left nondominant side: Secondary | ICD-10-CM | POA: Diagnosis not present

## 2017-12-10 DIAGNOSIS — M48061 Spinal stenosis, lumbar region without neurogenic claudication: Secondary | ICD-10-CM | POA: Diagnosis not present

## 2017-12-10 DIAGNOSIS — Z471 Aftercare following joint replacement surgery: Secondary | ICD-10-CM | POA: Diagnosis not present

## 2017-12-10 DIAGNOSIS — I10 Essential (primary) hypertension: Secondary | ICD-10-CM | POA: Diagnosis not present

## 2017-12-10 NOTE — Patient Outreach (Signed)
[facility]) Care Management  12/10/2017  CARDIN NITSCHKE [birth year] [id number]     EMMI-General Discharge RED ON EMMI ALERT Day # 4 Date: 12/09/17 Red Alert Reason:  "Lost interest in things? Yes  Sad/hopeless/anxious empty? Yes"   Outreach attempt # 1 to patient. Spoke with patient. He voices he is doing fairly well. His main concern is the pain that he is having. He was told by surgeon to expect a lot of pain given the procedure and damage done that needed to be repaired. Patient confirmed that he has pain meds and is taking them as prescribed. He is getting HHPT and they will be out shortly. Patient confirmed that he has already pre-medicated himself so that his pain will not be so severe doing and after session. He has his follow up appt on next week. He confirmed that he has transportation. He denies any issues or concerns regarding his meds. Reviewed and addressed red alerts with patient. He denies any of those feelings. His main concern is that he is not able to get up and do the normal things we was doing prior to surgery and its hard for him to sick. He is aware that this will get better as he continues to heal and recover. He denies any further RN CM needs or concerns at this time. He has completed automated discharge calls. Patient was appreciative of follow up call.        Plan: RN CM will close case as no further interventions needed at this time.   Enzo Montgomery, RN,BSN,CCM [facility] Telephonic Care Management Coordinator Direct Phone: [phone] Toll Free: [phone] Fax: [phone]

## 2017-12-11 ENCOUNTER — Telehealth: Payer: Self-pay | Admitting: Family Medicine

## 2017-12-11 DIAGNOSIS — M5126 Other intervertebral disc displacement, lumbar region: Secondary | ICD-10-CM | POA: Diagnosis not present

## 2017-12-11 DIAGNOSIS — Z471 Aftercare following joint replacement surgery: Secondary | ICD-10-CM | POA: Diagnosis not present

## 2017-12-11 DIAGNOSIS — M19072 Primary osteoarthritis, left ankle and foot: Secondary | ICD-10-CM | POA: Diagnosis not present

## 2017-12-11 DIAGNOSIS — I700 Atherosclerosis of aorta: Secondary | ICD-10-CM | POA: Diagnosis not present

## 2017-12-11 DIAGNOSIS — M5020 Other cervical disc displacement, unspecified cervical region: Secondary | ICD-10-CM | POA: Diagnosis not present

## 2017-12-11 DIAGNOSIS — Z96642 Presence of left artificial hip joint: Secondary | ICD-10-CM | POA: Diagnosis not present

## 2017-12-11 DIAGNOSIS — M47816 Spondylosis without myelopathy or radiculopathy, lumbar region: Secondary | ICD-10-CM | POA: Diagnosis not present

## 2017-12-11 DIAGNOSIS — G43909 Migraine, unspecified, not intractable, without status migrainosus: Secondary | ICD-10-CM | POA: Diagnosis not present

## 2017-12-11 DIAGNOSIS — G894 Chronic pain syndrome: Secondary | ICD-10-CM | POA: Diagnosis not present

## 2017-12-11 DIAGNOSIS — M461 Sacroiliitis, not elsewhere classified: Secondary | ICD-10-CM | POA: Diagnosis not present

## 2017-12-11 DIAGNOSIS — G8194 Hemiplegia, unspecified affecting left nondominant side: Secondary | ICD-10-CM | POA: Diagnosis not present

## 2017-12-11 DIAGNOSIS — M961 Postlaminectomy syndrome, not elsewhere classified: Secondary | ICD-10-CM | POA: Diagnosis not present

## 2017-12-11 DIAGNOSIS — Z9181 History of falling: Secondary | ICD-10-CM | POA: Diagnosis not present

## 2017-12-11 DIAGNOSIS — Z85038 Personal history of other malignant neoplasm of large intestine: Secondary | ICD-10-CM | POA: Diagnosis not present

## 2017-12-11 DIAGNOSIS — M48061 Spinal stenosis, lumbar region without neurogenic claudication: Secondary | ICD-10-CM | POA: Diagnosis not present

## 2017-12-11 DIAGNOSIS — I10 Essential (primary) hypertension: Secondary | ICD-10-CM | POA: Diagnosis not present

## 2017-12-11 DIAGNOSIS — M4802 Spinal stenosis, cervical region: Secondary | ICD-10-CM | POA: Diagnosis not present

## 2017-12-11 NOTE — Telephone Encounter (Signed)
It is OK to not do that. Thanks.

## 2017-12-11 NOTE — Telephone Encounter (Signed)
Patient notified

## 2017-12-11 NOTE — Telephone Encounter (Signed)
Copied from [place] [id number]. Topic: General - Other >> Dec 11, 2017  9:37 AM Judyann Munson wrote: Reason for CRM: Rip Harbour is requesting  a call back in regards to a call from the hospital the patient stated he received in regards to a test needing to be completed, but he doesn't remember the name of the test.    Rip Harbour: [phone] >> Dec 11, 2017  9:55 AM Don Perking M wrote: Please advise.  >> Dec 11, 2017 10:07 AM Amada Kingfisher, CMA wrote: Please advise. It appears that tomorrows appointment was for the renal imaging that Adriana had ordered. It has already been cancelled. They would like to know if this still needs done. If so will likely need to be preauthorized.

## 2017-12-12 ENCOUNTER — Ambulatory Visit: Payer: 59

## 2017-12-12 ENCOUNTER — Ambulatory Visit: Payer: Medicare Other

## 2017-12-12 DIAGNOSIS — I10 Essential (primary) hypertension: Secondary | ICD-10-CM | POA: Diagnosis not present

## 2017-12-12 DIAGNOSIS — Z85038 Personal history of other malignant neoplasm of large intestine: Secondary | ICD-10-CM | POA: Diagnosis not present

## 2017-12-12 DIAGNOSIS — G8194 Hemiplegia, unspecified affecting left nondominant side: Secondary | ICD-10-CM | POA: Diagnosis not present

## 2017-12-12 DIAGNOSIS — M48061 Spinal stenosis, lumbar region without neurogenic claudication: Secondary | ICD-10-CM | POA: Diagnosis not present

## 2017-12-12 DIAGNOSIS — M4802 Spinal stenosis, cervical region: Secondary | ICD-10-CM | POA: Diagnosis not present

## 2017-12-12 DIAGNOSIS — M461 Sacroiliitis, not elsewhere classified: Secondary | ICD-10-CM | POA: Diagnosis not present

## 2017-12-12 DIAGNOSIS — Z9181 History of falling: Secondary | ICD-10-CM | POA: Diagnosis not present

## 2017-12-12 DIAGNOSIS — I700 Atherosclerosis of aorta: Secondary | ICD-10-CM | POA: Diagnosis not present

## 2017-12-12 DIAGNOSIS — Z96642 Presence of left artificial hip joint: Secondary | ICD-10-CM | POA: Diagnosis not present

## 2017-12-12 DIAGNOSIS — G43909 Migraine, unspecified, not intractable, without status migrainosus: Secondary | ICD-10-CM | POA: Diagnosis not present

## 2017-12-12 DIAGNOSIS — M47816 Spondylosis without myelopathy or radiculopathy, lumbar region: Secondary | ICD-10-CM | POA: Diagnosis not present

## 2017-12-12 DIAGNOSIS — M961 Postlaminectomy syndrome, not elsewhere classified: Secondary | ICD-10-CM | POA: Diagnosis not present

## 2017-12-12 DIAGNOSIS — M19072 Primary osteoarthritis, left ankle and foot: Secondary | ICD-10-CM | POA: Diagnosis not present

## 2017-12-12 DIAGNOSIS — G894 Chronic pain syndrome: Secondary | ICD-10-CM | POA: Diagnosis not present

## 2017-12-12 DIAGNOSIS — Z471 Aftercare following joint replacement surgery: Secondary | ICD-10-CM | POA: Diagnosis not present

## 2017-12-12 DIAGNOSIS — M5126 Other intervertebral disc displacement, lumbar region: Secondary | ICD-10-CM | POA: Diagnosis not present

## 2017-12-12 DIAGNOSIS — M5020 Other cervical disc displacement, unspecified cervical region: Secondary | ICD-10-CM | POA: Diagnosis not present

## 2017-12-15 DIAGNOSIS — Z96642 Presence of left artificial hip joint: Secondary | ICD-10-CM | POA: Diagnosis not present

## 2017-12-15 DIAGNOSIS — M461 Sacroiliitis, not elsewhere classified: Secondary | ICD-10-CM | POA: Diagnosis not present

## 2017-12-15 DIAGNOSIS — M4802 Spinal stenosis, cervical region: Secondary | ICD-10-CM | POA: Diagnosis not present

## 2017-12-15 DIAGNOSIS — Z471 Aftercare following joint replacement surgery: Secondary | ICD-10-CM | POA: Diagnosis not present

## 2017-12-15 DIAGNOSIS — G8194 Hemiplegia, unspecified affecting left nondominant side: Secondary | ICD-10-CM | POA: Diagnosis not present

## 2017-12-15 DIAGNOSIS — M5020 Other cervical disc displacement, unspecified cervical region: Secondary | ICD-10-CM | POA: Diagnosis not present

## 2017-12-15 DIAGNOSIS — Z85038 Personal history of other malignant neoplasm of large intestine: Secondary | ICD-10-CM | POA: Diagnosis not present

## 2017-12-15 DIAGNOSIS — M48061 Spinal stenosis, lumbar region without neurogenic claudication: Secondary | ICD-10-CM | POA: Diagnosis not present

## 2017-12-15 DIAGNOSIS — M5126 Other intervertebral disc displacement, lumbar region: Secondary | ICD-10-CM | POA: Diagnosis not present

## 2017-12-15 DIAGNOSIS — M961 Postlaminectomy syndrome, not elsewhere classified: Secondary | ICD-10-CM | POA: Diagnosis not present

## 2017-12-15 DIAGNOSIS — M19072 Primary osteoarthritis, left ankle and foot: Secondary | ICD-10-CM | POA: Diagnosis not present

## 2017-12-15 DIAGNOSIS — G43909 Migraine, unspecified, not intractable, without status migrainosus: Secondary | ICD-10-CM | POA: Diagnosis not present

## 2017-12-15 DIAGNOSIS — I700 Atherosclerosis of aorta: Secondary | ICD-10-CM | POA: Diagnosis not present

## 2017-12-15 DIAGNOSIS — G894 Chronic pain syndrome: Secondary | ICD-10-CM | POA: Diagnosis not present

## 2017-12-15 DIAGNOSIS — M47816 Spondylosis without myelopathy or radiculopathy, lumbar region: Secondary | ICD-10-CM | POA: Diagnosis not present

## 2017-12-15 DIAGNOSIS — I10 Essential (primary) hypertension: Secondary | ICD-10-CM | POA: Diagnosis not present

## 2017-12-15 DIAGNOSIS — Z9181 History of falling: Secondary | ICD-10-CM | POA: Diagnosis not present

## 2017-12-16 DIAGNOSIS — M19072 Primary osteoarthritis, left ankle and foot: Secondary | ICD-10-CM | POA: Diagnosis not present

## 2017-12-16 DIAGNOSIS — M961 Postlaminectomy syndrome, not elsewhere classified: Secondary | ICD-10-CM | POA: Diagnosis not present

## 2017-12-16 DIAGNOSIS — M5126 Other intervertebral disc displacement, lumbar region: Secondary | ICD-10-CM | POA: Diagnosis not present

## 2017-12-16 DIAGNOSIS — Z96642 Presence of left artificial hip joint: Secondary | ICD-10-CM | POA: Diagnosis not present

## 2017-12-16 DIAGNOSIS — I700 Atherosclerosis of aorta: Secondary | ICD-10-CM | POA: Diagnosis not present

## 2017-12-16 DIAGNOSIS — M47816 Spondylosis without myelopathy or radiculopathy, lumbar region: Secondary | ICD-10-CM | POA: Diagnosis not present

## 2017-12-16 DIAGNOSIS — Z471 Aftercare following joint replacement surgery: Secondary | ICD-10-CM | POA: Diagnosis not present

## 2017-12-16 DIAGNOSIS — G894 Chronic pain syndrome: Secondary | ICD-10-CM | POA: Diagnosis not present

## 2017-12-16 DIAGNOSIS — I10 Essential (primary) hypertension: Secondary | ICD-10-CM | POA: Diagnosis not present

## 2017-12-16 DIAGNOSIS — G43909 Migraine, unspecified, not intractable, without status migrainosus: Secondary | ICD-10-CM | POA: Diagnosis not present

## 2017-12-16 DIAGNOSIS — M461 Sacroiliitis, not elsewhere classified: Secondary | ICD-10-CM | POA: Diagnosis not present

## 2017-12-16 DIAGNOSIS — M4802 Spinal stenosis, cervical region: Secondary | ICD-10-CM | POA: Diagnosis not present

## 2017-12-16 DIAGNOSIS — Z85038 Personal history of other malignant neoplasm of large intestine: Secondary | ICD-10-CM | POA: Diagnosis not present

## 2017-12-16 DIAGNOSIS — Z9181 History of falling: Secondary | ICD-10-CM | POA: Diagnosis not present

## 2017-12-16 DIAGNOSIS — M48061 Spinal stenosis, lumbar region without neurogenic claudication: Secondary | ICD-10-CM | POA: Diagnosis not present

## 2017-12-16 DIAGNOSIS — M5020 Other cervical disc displacement, unspecified cervical region: Secondary | ICD-10-CM | POA: Diagnosis not present

## 2017-12-16 DIAGNOSIS — G8194 Hemiplegia, unspecified affecting left nondominant side: Secondary | ICD-10-CM | POA: Diagnosis not present

## 2017-12-17 ENCOUNTER — Telehealth: Payer: Self-pay

## 2017-12-17 NOTE — Telephone Encounter (Signed)
° °  Joaquim Lai with [facility] call and said the below med does not require a PA  SUMAtriptan (IMITREX) 100 MG tablet

## 2017-12-17 NOTE — Telephone Encounter (Signed)
PA submitted for Sumatriptan via Cover My Meds. Key: Chauncy Lean

## 2017-12-22 DIAGNOSIS — M48061 Spinal stenosis, lumbar region without neurogenic claudication: Secondary | ICD-10-CM | POA: Diagnosis not present

## 2017-12-22 DIAGNOSIS — M4802 Spinal stenosis, cervical region: Secondary | ICD-10-CM | POA: Diagnosis not present

## 2017-12-22 DIAGNOSIS — M5020 Other cervical disc displacement, unspecified cervical region: Secondary | ICD-10-CM | POA: Diagnosis not present

## 2017-12-22 DIAGNOSIS — G894 Chronic pain syndrome: Secondary | ICD-10-CM | POA: Diagnosis not present

## 2017-12-22 DIAGNOSIS — M961 Postlaminectomy syndrome, not elsewhere classified: Secondary | ICD-10-CM | POA: Diagnosis not present

## 2017-12-22 DIAGNOSIS — Z471 Aftercare following joint replacement surgery: Secondary | ICD-10-CM | POA: Diagnosis not present

## 2017-12-22 DIAGNOSIS — I10 Essential (primary) hypertension: Secondary | ICD-10-CM | POA: Diagnosis not present

## 2017-12-22 DIAGNOSIS — Z96642 Presence of left artificial hip joint: Secondary | ICD-10-CM | POA: Diagnosis not present

## 2017-12-22 DIAGNOSIS — Z85038 Personal history of other malignant neoplasm of large intestine: Secondary | ICD-10-CM | POA: Diagnosis not present

## 2017-12-22 DIAGNOSIS — G8194 Hemiplegia, unspecified affecting left nondominant side: Secondary | ICD-10-CM | POA: Diagnosis not present

## 2017-12-22 DIAGNOSIS — M5126 Other intervertebral disc displacement, lumbar region: Secondary | ICD-10-CM | POA: Diagnosis not present

## 2017-12-22 DIAGNOSIS — Z9181 History of falling: Secondary | ICD-10-CM | POA: Diagnosis not present

## 2017-12-22 DIAGNOSIS — M47816 Spondylosis without myelopathy or radiculopathy, lumbar region: Secondary | ICD-10-CM | POA: Diagnosis not present

## 2017-12-22 DIAGNOSIS — G43909 Migraine, unspecified, not intractable, without status migrainosus: Secondary | ICD-10-CM | POA: Diagnosis not present

## 2017-12-22 DIAGNOSIS — M461 Sacroiliitis, not elsewhere classified: Secondary | ICD-10-CM | POA: Diagnosis not present

## 2017-12-22 DIAGNOSIS — M19072 Primary osteoarthritis, left ankle and foot: Secondary | ICD-10-CM | POA: Diagnosis not present

## 2017-12-22 DIAGNOSIS — I700 Atherosclerosis of aorta: Secondary | ICD-10-CM | POA: Diagnosis not present

## 2017-12-24 DIAGNOSIS — M4802 Spinal stenosis, cervical region: Secondary | ICD-10-CM | POA: Diagnosis not present

## 2017-12-24 DIAGNOSIS — M461 Sacroiliitis, not elsewhere classified: Secondary | ICD-10-CM | POA: Diagnosis not present

## 2017-12-24 DIAGNOSIS — G8194 Hemiplegia, unspecified affecting left nondominant side: Secondary | ICD-10-CM | POA: Diagnosis not present

## 2017-12-24 DIAGNOSIS — Z471 Aftercare following joint replacement surgery: Secondary | ICD-10-CM | POA: Diagnosis not present

## 2017-12-24 DIAGNOSIS — M47816 Spondylosis without myelopathy or radiculopathy, lumbar region: Secondary | ICD-10-CM | POA: Diagnosis not present

## 2017-12-24 DIAGNOSIS — M48061 Spinal stenosis, lumbar region without neurogenic claudication: Secondary | ICD-10-CM | POA: Diagnosis not present

## 2017-12-24 DIAGNOSIS — G894 Chronic pain syndrome: Secondary | ICD-10-CM | POA: Diagnosis not present

## 2017-12-24 DIAGNOSIS — Z96642 Presence of left artificial hip joint: Secondary | ICD-10-CM | POA: Diagnosis not present

## 2017-12-24 DIAGNOSIS — I700 Atherosclerosis of aorta: Secondary | ICD-10-CM | POA: Diagnosis not present

## 2017-12-24 DIAGNOSIS — M5020 Other cervical disc displacement, unspecified cervical region: Secondary | ICD-10-CM | POA: Diagnosis not present

## 2017-12-24 DIAGNOSIS — M19072 Primary osteoarthritis, left ankle and foot: Secondary | ICD-10-CM | POA: Diagnosis not present

## 2017-12-24 DIAGNOSIS — Z9181 History of falling: Secondary | ICD-10-CM | POA: Diagnosis not present

## 2017-12-24 DIAGNOSIS — Z85038 Personal history of other malignant neoplasm of large intestine: Secondary | ICD-10-CM | POA: Diagnosis not present

## 2017-12-24 DIAGNOSIS — M5126 Other intervertebral disc displacement, lumbar region: Secondary | ICD-10-CM | POA: Diagnosis not present

## 2017-12-24 DIAGNOSIS — I10 Essential (primary) hypertension: Secondary | ICD-10-CM | POA: Diagnosis not present

## 2017-12-24 DIAGNOSIS — G43909 Migraine, unspecified, not intractable, without status migrainosus: Secondary | ICD-10-CM | POA: Diagnosis not present

## 2017-12-24 DIAGNOSIS — M961 Postlaminectomy syndrome, not elsewhere classified: Secondary | ICD-10-CM | POA: Diagnosis not present

## 2017-12-25 ENCOUNTER — Ambulatory Visit (INDEPENDENT_AMBULATORY_CARE_PROVIDER_SITE_OTHER): Payer: Medicare Other | Admitting: Family Medicine

## 2017-12-25 ENCOUNTER — Telehealth: Payer: Self-pay | Admitting: Family Medicine

## 2017-12-25 ENCOUNTER — Other Ambulatory Visit: Payer: Self-pay | Admitting: Family Medicine

## 2017-12-25 ENCOUNTER — Encounter: Payer: Self-pay | Admitting: Family Medicine

## 2017-12-25 VITALS — BP 136/90 | HR 92 | Temp 97.8°F | Ht 70.0 in | Wt 223.3 lb

## 2017-12-25 DIAGNOSIS — I700 Atherosclerosis of aorta: Secondary | ICD-10-CM | POA: Diagnosis not present

## 2017-12-25 DIAGNOSIS — Z1211 Encounter for screening for malignant neoplasm of colon: Secondary | ICD-10-CM

## 2017-12-25 DIAGNOSIS — I129 Hypertensive chronic kidney disease with stage 1 through stage 4 chronic kidney disease, or unspecified chronic kidney disease: Secondary | ICD-10-CM

## 2017-12-25 MED ORDER — METOPROLOL SUCCINATE ER 100 MG PO TB24: 100.0000 mg | ORAL_TABLET | Freq: Every day | ORAL | 1 refills | Status: DC

## 2017-12-25 MED ORDER — HYDRALAZINE HCL 25 MG PO TABS: 25.0000 mg | ORAL_TABLET | Freq: Every day | ORAL | 1 refills | Status: DC

## 2017-12-25 NOTE — Telephone Encounter (Signed)
[facility] called and stated that there was not a script sent over for hydralazine 50mg  and would like to have it sent in.

## 2017-12-25 NOTE — Telephone Encounter (Signed)
He told me that he was taking the 25mg  of hydralazine- which was called over- not the 50mg . Not sure what they're talking about.

## 2017-12-25 NOTE — Telephone Encounter (Signed)
Sig on RX sent over: Take 1 tablet (25 mg total) by mouth daily. Take with 50 mg to equal 75 mg 3 times daily - Oral

## 2017-12-25 NOTE — Assessment & Plan Note (Signed)
Will keep BP and cholesterol under good control. Continue to monitor.  

## 2017-12-25 NOTE — Telephone Encounter (Signed)
Sig corrected

## 2017-12-25 NOTE — Telephone Encounter (Signed)
[place] notified.

## 2017-12-25 NOTE — Assessment & Plan Note (Signed)
Doing much better on current regimen. Continue current regimen. Continue to monitor. Refills given today. Call with any concerns.

## 2017-12-25 NOTE — Progress Notes (Signed)
BP 136/90 (BP Location: Left Arm, Cuff Size: Small)   Pulse 92   Temp 97.8 F (36.6 C) (Oral)   Ht 5\' 10"  (1.778 m)   Wt 223 lb 4.8 oz (101.3 kg)   SpO2 98%   BMI 32.04 kg/m    Subjective:    Patient ID: Kristopher [name], male    DOB: 1960-11-18, 57 y.o.   MRN: [id number]  HPI: Kristopher [name] is a 57 y.o. male  Chief Complaint  Patient presents with  . Hypertension    1 month follow-up   HYPERTENSION Hypertension status: better  Satisfied with current treatment? yes Duration of hypertension: chronic BP monitoring frequency:  a few times a week BP range: 120s/80s BP medication side effects:  no Medication compliance: excellent compliance Previous BP meds: currently hydralazine and metoprolol, amlodipine and valsartan, previously HCTZ Aspirin: no Recurrent headaches: no Visual changes: no Palpitations: no Dyspnea: no Chest pain: no Lower extremity edema: no Dizzy/lightheaded: no  Relevant past medical, surgical, family and social history reviewed and updated as indicated. Interim medical history since our last visit reviewed. Allergies and medications reviewed and updated.  Review of Systems  Constitutional: Negative.   Respiratory: Negative.   Cardiovascular: Negative.   Musculoskeletal: Positive for arthralgias. Negative for back pain, gait problem, joint swelling, myalgias, neck pain and neck stiffness.  Skin: Negative.   Psychiatric/Behavioral: Negative.     Per HPI unless specifically indicated above     Objective:    BP 136/90 (BP Location: Left Arm, Cuff Size: Small)   Pulse 92   Temp 97.8 F (36.6 C) (Oral)   Ht 5\' 10"  (1.778 m)   Wt 223 lb 4.8 oz (101.3 kg)   SpO2 98%   BMI 32.04 kg/m   Wt Readings from Last 3 Encounters:  12/25/17 223 lb 4.8 oz (101.3 kg)  12/02/17 238 lb (108 kg)  11/25/17 233 lb (105.7 kg)    Physical Exam  Constitutional: He is oriented to person, place, and time. He appears well-developed and well-nourished. No  distress.  HENT:  Head: Normocephalic and atraumatic.  Right Ear: Hearing normal.  Left Ear: Hearing normal.  Nose: Nose normal.  Eyes: Conjunctivae and lids are normal. Right eye exhibits no discharge. Left eye exhibits no discharge. No scleral icterus.  Cardiovascular: Normal rate, regular rhythm and normal heart sounds.  Pulmonary/Chest: Effort normal. No respiratory distress.  Musculoskeletal: Normal range of motion.  Neurological: He is alert and oriented to person, place, and time.  Skin: Skin is intact. No rash noted.  Psychiatric: He has a normal mood and affect. His speech is normal and behavior is normal. Judgment and thought content normal. Cognition and memory are normal.    Results for orders placed or performed during the hospital encounter of 12/02/17  CBC  Result Value Ref Range   WBC 6.3 3.8 - 10.6 K/uL   RBC 4.57 4.40 - 5.90 MIL/uL   Hemoglobin 14.1 13.0 - 18.0 g/dL   HCT 40.6 40.0 - 52.0 %   MCV 88.8 80.0 - 100.0 fL   MCH 30.8 26.0 - 34.0 pg   MCHC 34.7 32.0 - 36.0 g/dL   RDW 13.8 11.5 - 14.5 %   Platelets 160 150 - 440 K/uL  Creatinine, serum  Result Value Ref Range   Creatinine, Ser 0.80 0.61 - 1.24 mg/dL   GFR calc non Af Amer >60 >60 mL/min   GFR calc Af Amer >60 >60 mL/min  CBC  Result Value Ref  Range   WBC 10.7 (H) 3.8 - 10.6 K/uL   RBC 3.73 (L) 4.40 - 5.90 MIL/uL   Hemoglobin 11.8 (L) 13.0 - 18.0 g/dL   HCT 33.7 (L) 40.0 - 52.0 %   MCV 90.2 80.0 - 100.0 fL   MCH 31.6 26.0 - 34.0 pg   MCHC 35.1 32.0 - 36.0 g/dL   RDW 14.6 (H) 11.5 - 14.5 %   Platelets 139 (L) 150 - 440 K/uL  Basic metabolic panel  Result Value Ref Range   Sodium 139 135 - 145 mmol/L   Potassium 4.0 3.5 - 5.1 mmol/L   Chloride 104 98 - 111 mmol/L   CO2 29 22 - 32 mmol/L   Glucose, Bld 266 (H) 70 - 99 mg/dL   BUN 15 6 - 20 mg/dL   Creatinine, Ser 1.11 0.61 - 1.24 mg/dL   Calcium 9.1 8.9 - 10.3 mg/dL   GFR calc non Af Amer >60 >60 mL/min   GFR calc Af Amer >60 >60 mL/min    Anion gap 6 5 - 15  Type and screen [facility]  Result Value Ref Range   ABO/RH(D) A POS    Antibody Screen NEG    Sample Expiration      12/05/2017 Performed at [facility], [address] [place], [place] [address]   Surgical pathology  Result Value Ref Range   SURGICAL PATHOLOGY      Surgical Pathology CASE: [id number] PATIENT: Terrilyn Saver Surgical Pathology Report     SPECIMEN SUBMITTED: A. Femoral head, left  CLINICAL HISTORY: None provided  PRE-OPERATIVE DIAGNOSIS: Primary osteoarthritis of left hip  POST-OPERATIVE DIAGNOSIS: Same as pre op     DIAGNOSIS: A. FEMORAL HEAD, LEFT; ARTHROPLASTY: - OSTEOARTHROSIS. - TRILINEAGE HEMATOPOIESIS.  GROSS DESCRIPTION: A. Labeled: "Left" femoral head (verified "left" with OR nurse) Received: In formalin Size of specimen:      Head -4.6 x 4.2 cm      Neck -4.1 x 3.5 cm Articular surface: Purple to tan with granularity/nodularity Cut surface: tan to purple Other findings: No additional noted  Block summary: 1 - representative sections  Tissue decalcification: Yes   Final Diagnosis performed by Quay Burow, MD.   Electronically signed 12/06/2017 7:45:43AM The electronic signature indicates that the named Attending Pathologist has evaluated the specimen  Technical component p erformed at [facility], [address], [place], [place] [address] Lab: [phone] Dir: Rush Farmer, MD, MMM  Professional component performed at [facility], [facility], [place], [place], [place] [address] Lab: [phone] Dir: Dellia Nims. Rubinas, MD       Assessment & Plan:   Problem List Items Addressed This Visit      Cardiovascular and Mediastinum   Aortic atherosclerosis ([place])    Will keep BP and cholesterol under good control. Continue to monitor.       Relevant Medications   hydrALAZINE (APRESOLINE) 25 MG tablet   metoprolol succinate (TOPROL-XL) 100 MG 24 hr  tablet     Genitourinary   Benign hypertensive renal disease - Primary    Doing much better on current regimen. Continue current regimen. Continue to monitor. Refills given today. Call with any concerns.       Relevant Orders   Basic metabolic panel    Other Visit Diagnoses    Screening for colon cancer       Referral to GI made today.   Relevant Orders   Ambulatory referral to Gastroenterology  Follow up plan: Return in about 6 months (around 06/26/2018) for Physical and wellness.

## 2017-12-26 ENCOUNTER — Telehealth: Payer: Self-pay | Admitting: Family Medicine

## 2017-12-26 LAB — BASIC METABOLIC PANEL
BUN/Creatinine Ratio: 12 (ref 9–20)
BUN: 11 mg/dL (ref 6–24)
CO2: 23 mmol/L (ref 20–29)
Calcium: 9.5 mg/dL (ref 8.7–10.2)
Chloride: 95 mmol/L — ABNORMAL LOW (ref 96–106)
Creatinine, Ser: 0.93 mg/dL (ref 0.76–1.27)
GFR calc Af Amer: 106 mL/min/{1.73_m2} (ref >59)
GFR calc non Af Amer: 92 mL/min/{1.73_m2} (ref >59)
Glucose: 384 mg/dL — ABNORMAL HIGH (ref 65–99)
Potassium: 4.2 mmol/L (ref 3.5–5.2)
Sodium: 136 mmol/L (ref 134–144)

## 2017-12-26 NOTE — Telephone Encounter (Signed)
Message relayed to patient. Verbalized understanding and denied questions.  Pt scheduled for Tuesday 10/29 @ 8:30

## 2017-12-26 NOTE — Telephone Encounter (Signed)
Please let him know that his kidney function looked good, but his sugars are really high. I'd like him to come in for an appointment to discuss this and to check his sugar average over 3 months. We may need to start some medicine to help keep his sugars under control.

## 2017-12-30 ENCOUNTER — Other Ambulatory Visit: Payer: Self-pay

## 2017-12-30 ENCOUNTER — Ambulatory Visit (INDEPENDENT_AMBULATORY_CARE_PROVIDER_SITE_OTHER): Payer: Medicare Other | Admitting: Family Medicine

## 2017-12-30 ENCOUNTER — Telehealth: Payer: Self-pay | Admitting: Family Medicine

## 2017-12-30 ENCOUNTER — Encounter: Payer: Self-pay | Admitting: Family Medicine

## 2017-12-30 VITALS — BP 158/100 | HR 85 | Wt 221.0 lb

## 2017-12-30 DIAGNOSIS — Z86010 Personal history of colonic polyps: Secondary | ICD-10-CM

## 2017-12-30 DIAGNOSIS — R739 Hyperglycemia, unspecified: Secondary | ICD-10-CM | POA: Diagnosis not present

## 2017-12-30 DIAGNOSIS — G43909 Migraine, unspecified, not intractable, without status migrainosus: Secondary | ICD-10-CM | POA: Diagnosis not present

## 2017-12-30 DIAGNOSIS — Z96642 Presence of left artificial hip joint: Secondary | ICD-10-CM | POA: Diagnosis not present

## 2017-12-30 DIAGNOSIS — I10 Essential (primary) hypertension: Secondary | ICD-10-CM | POA: Diagnosis not present

## 2017-12-30 DIAGNOSIS — M19072 Primary osteoarthritis, left ankle and foot: Secondary | ICD-10-CM | POA: Diagnosis not present

## 2017-12-30 DIAGNOSIS — Z85038 Personal history of other malignant neoplasm of large intestine: Secondary | ICD-10-CM | POA: Diagnosis not present

## 2017-12-30 DIAGNOSIS — G8194 Hemiplegia, unspecified affecting left nondominant side: Secondary | ICD-10-CM | POA: Diagnosis not present

## 2017-12-30 DIAGNOSIS — Z471 Aftercare following joint replacement surgery: Secondary | ICD-10-CM | POA: Diagnosis not present

## 2017-12-30 DIAGNOSIS — M461 Sacroiliitis, not elsewhere classified: Secondary | ICD-10-CM | POA: Diagnosis not present

## 2017-12-30 DIAGNOSIS — M48061 Spinal stenosis, lumbar region without neurogenic claudication: Secondary | ICD-10-CM | POA: Diagnosis not present

## 2017-12-30 DIAGNOSIS — N181 Chronic kidney disease, stage 1: Secondary | ICD-10-CM

## 2017-12-30 DIAGNOSIS — I700 Atherosclerosis of aorta: Secondary | ICD-10-CM | POA: Diagnosis not present

## 2017-12-30 DIAGNOSIS — M961 Postlaminectomy syndrome, not elsewhere classified: Secondary | ICD-10-CM | POA: Diagnosis not present

## 2017-12-30 DIAGNOSIS — Z9181 History of falling: Secondary | ICD-10-CM | POA: Diagnosis not present

## 2017-12-30 DIAGNOSIS — E1122 Type 2 diabetes mellitus with diabetic chronic kidney disease: Secondary | ICD-10-CM | POA: Diagnosis not present

## 2017-12-30 DIAGNOSIS — M47816 Spondylosis without myelopathy or radiculopathy, lumbar region: Secondary | ICD-10-CM | POA: Diagnosis not present

## 2017-12-30 DIAGNOSIS — M4802 Spinal stenosis, cervical region: Secondary | ICD-10-CM | POA: Diagnosis not present

## 2017-12-30 DIAGNOSIS — G894 Chronic pain syndrome: Secondary | ICD-10-CM | POA: Diagnosis not present

## 2017-12-30 DIAGNOSIS — M5126 Other intervertebral disc displacement, lumbar region: Secondary | ICD-10-CM | POA: Diagnosis not present

## 2017-12-30 DIAGNOSIS — M5020 Other cervical disc displacement, unspecified cervical region: Secondary | ICD-10-CM | POA: Diagnosis not present

## 2017-12-30 LAB — BAYER DCA HB A1C WAIVED: HB A1C (BAYER DCA - WAIVED): 10 % — ABNORMAL HIGH (ref <7.0)

## 2017-12-30 MED ORDER — METFORMIN HCL ER 500 MG PO TB24: 1000.0000 mg | ORAL_TABLET | Freq: Two times a day (BID) | ORAL | 4 refills | Status: DC

## 2017-12-30 NOTE — Patient Instructions (Signed)
Diabetes Mellitus and Standards of Medical Care Managing diabetes (diabetes mellitus) can be complicated. Your diabetes treatment may be managed by a team of health care providers, including:  A diet and nutrition specialist (registered dietitian).  A nurse.  A certified diabetes educator (CDE).  A diabetes specialist (endocrinologist).  An eye doctor.  A primary care provider.  A dentist.  Your health care providers follow a schedule in order to help you get the best quality of care. The following schedule is a general guideline for your diabetes management plan. Your health care providers may also give you more specific instructions. HbA1c ( hemoglobin A1c) test This test provides information about blood sugar (glucose) control over the previous 2-3 months. It is used to check whether your diabetes management plan needs to be adjusted.  If you are meeting your treatment goals, this test is done at least 2 times a year.  If you are not meeting treatment goals or if your treatment goals have changed, this test is done 4 times a year.  Blood pressure test  This test is done at every routine medical visit. For most people, the goal is less than 130/80. Ask your health care provider what your goal blood pressure should be. Dental and eye exams  Visit your dentist two times a year.  If you have type 1 diabetes, get an eye exam 3-5 years after you are diagnosed, and then once a year after your first exam. ? If you were diagnosed with type 1 diabetes as a child, get an eye exam when you are age 16 or older and have had diabetes for 3-5 years. After the first exam, you should get an eye exam once a year.  If you have type 2 diabetes, have an eye exam as soon as you are diagnosed, and then once a year after your first exam. Foot care exam  Visual foot exams are done at every routine medical visit. The exams check for cuts, bruises, redness, blisters, sores, or other problems with the  feet.  A complete foot exam is done by your health care provider once a year. This exam includes an inspection of the structure and skin of your feet, and a check of the pulses and sensation in your feet. ? Type 1 diabetes: Get your first exam 3-5 years after diagnosis. ? Type 2 diabetes: Get your first exam as soon as you are diagnosed.  Check your feet every day for cuts, bruises, redness, blisters, or sores. If you have any of these or other problems that are not healing, contact your health care provider. Kidney function test ( urine microalbumin)  This test is done once a year. ? Type 1 diabetes: Get your first test 5 years after diagnosis. ? Type 2 diabetes: Get your first test as soon as you are diagnosed.  If you have chronic kidney disease (CKD), get a serum creatinine and estimated glomerular filtration rate (eGFR) test once a year. Lipid profile (cholesterol, HDL, LDL, triglycerides)  This test should be done when you are diagnosed with diabetes, and every 5 years after the first test. If you are on medicines to lower your cholesterol, you may need to get this test done every year. ? The goal for LDL is less than 100 mg/dL (5.5 mmol/L). If you are at high risk, the goal is less than 70 mg/dL (3.9 mmol/L). ? The goal for HDL is 40 mg/dL (2.2 mmol/L) for men and 50 mg/dL(2.8 mmol/L) for women. An HDL  cholesterol of 60 mg/dL (3.3 mmol/L) or higher gives some protection against heart disease. ? The goal for triglycerides is less than 150 mg/dL (8.3 mmol/L). Immunizations  The yearly flu (influenza) vaccine is recommended for everyone 6 months or older who has diabetes.  The pneumonia (pneumococcal) vaccine is recommended for everyone 2 years or older who has diabetes. If you are 68 or older, you may get the pneumonia vaccine as a series of two separate shots.  The hepatitis B vaccine is recommended for adults shortly after they have been diagnosed with diabetes.  The Tdap  (tetanus, diphtheria, and pertussis) vaccine should be given: ? According to normal childhood vaccination schedules, for children. ? Every 10 years, for adults who have diabetes.  The shingles vaccine is recommended for people who have had chicken pox and are 50 years or older. Mental and emotional health  Screening for symptoms of eating disorders, anxiety, and depression is recommended at the time of diagnosis and afterward as needed. If your screening shows that you have symptoms (you have a positive screening result), you may need further evaluation and be referred to a mental health care provider. Diabetes self-management education  Education about how to manage your diabetes is recommended at diagnosis and ongoing as needed. Treatment plan  Your treatment plan will be reviewed at every medical visit. Summary  Managing diabetes (diabetes mellitus) can be complicated. Your diabetes treatment may be managed by a team of health care providers.  Your health care providers follow a schedule in order to help you get the best quality of care.  Standards of care including having regular physical exams, blood tests, blood pressure monitoring, immunizations, screening tests, and education about how to manage your diabetes.  Your health care providers may also give you more specific instructions based on your individual health. This information is not intended to replace advice given to you by your health care provider. Make sure you discuss any questions you have with your health care provider. Document Released: 12/16/2008 Document Revised: 11/17/2015 Document Reviewed: 11/17/2015 Elsevier Interactive Patient Education  2018 [employer].  Diabetes Mellitus and Nutrition When you have diabetes (diabetes mellitus), it is very important to have healthy eating habits because your blood sugar (glucose) levels are greatly affected by what you eat and drink. Eating healthy foods in the appropriate  amounts, at about the same times every day, can help you:  Control your blood glucose.  Lower your risk of heart disease.  Improve your blood pressure.  Reach or maintain a healthy weight.  Every person with diabetes is different, and each person has different needs for a meal plan. Your health care provider may recommend that you work with a diet and nutrition specialist (dietitian) to make a meal plan that is best for you. Your meal plan may vary depending on factors such as:  The calories you need.  The medicines you take.  Your weight.  Your blood glucose, blood pressure, and cholesterol levels.  Your activity level.  Other health conditions you have, such as heart or kidney disease.  How do carbohydrates affect me? Carbohydrates affect your blood glucose level more than any other type of food. Eating carbohydrates naturally increases the amount of glucose in your blood. Carbohydrate counting is a method for keeping track of how many carbohydrates you eat. Counting carbohydrates is important to keep your blood glucose at a healthy level, especially if you use insulin or take certain oral diabetes medicines. It is important to know  how many carbohydrates you can safely have in each meal. This is different for every person. Your dietitian can help you calculate how many carbohydrates you should have at each meal and for snack. Foods that contain carbohydrates include:  Bread, cereal, rice, pasta, and crackers.  Potatoes and corn.  Peas, beans, and lentils.  Milk and yogurt.  Fruit and juice.  Desserts, such as cakes, cookies, ice cream, and candy.  How does alcohol affect me? Alcohol can cause a sudden decrease in blood glucose (hypoglycemia), especially if you use insulin or take certain oral diabetes medicines. Hypoglycemia can be a life-threatening condition. Symptoms of hypoglycemia (sleepiness, dizziness, and confusion) are similar to symptoms of having too much  alcohol. If your health care provider says that alcohol is safe for you, follow these guidelines:  Limit alcohol intake to no more than 1 drink per day for nonpregnant women and 2 drinks per day for men. One drink equals 12 oz of beer, 5 oz of wine, or 1 oz of hard liquor.  Do not drink on an empty stomach.  Keep yourself hydrated with water, diet soda, or unsweetened iced tea.  Keep in mind that regular soda, juice, and other mixers may contain a lot of sugar and must be counted as carbohydrates.  What are tips for following this plan? Reading food labels  Start by checking the serving size on the label. The amount of calories, carbohydrates, fats, and other nutrients listed on the label are based on one serving of the food. Many foods contain more than one serving per package.  Check the total grams (g) of carbohydrates in one serving. You can calculate the number of servings of carbohydrates in one serving by dividing the total carbohydrates by 15. For example, if a food has 30 g of total carbohydrates, it would be equal to 2 servings of carbohydrates.  Check the number of grams (g) of saturated and trans fats in one serving. Choose foods that have low or no amount of these fats.  Check the number of milligrams (mg) of sodium in one serving. Most people should limit total sodium intake to less than 2,300 mg per day.  Always check the nutrition information of foods labeled as "low-fat" or "nonfat". These foods may be higher in added sugar or refined carbohydrates and should be avoided.  Talk to your dietitian to identify your daily goals for nutrients listed on the label. Shopping  Avoid buying canned, premade, or processed foods. These foods tend to be high in fat, sodium, and added sugar.  Shop around the outside edge of the grocery store. This includes fresh fruits and vegetables, bulk grains, fresh meats, and fresh dairy. Cooking  Use low-heat cooking methods, such as baking,  instead of high-heat cooking methods like deep frying.  Cook using healthy oils, such as olive, canola, or sunflower oil.  Avoid cooking with butter, cream, or high-fat meats. Meal planning  Eat meals and snacks regularly, preferably at the same times every day. Avoid going long periods of time without eating.  Eat foods high in fiber, such as fresh fruits, vegetables, beans, and whole grains. Talk to your dietitian about how many servings of carbohydrates you can eat at each meal.  Eat 4-6 ounces of lean protein each day, such as lean meat, chicken, fish, eggs, or tofu. 1 ounce is equal to 1 ounce of meat, chicken, or fish, 1 egg, or 1/4 cup of tofu.  Eat some foods each day that contain healthy  fats, such as avocado, nuts, seeds, and fish. Lifestyle   Check your blood glucose regularly.  Exercise at least 30 minutes 5 or more days each week, or as told by your health care provider.  Take medicines as told by your health care provider.  Do not use any products that contain nicotine or tobacco, such as cigarettes and e-cigarettes. If you need help quitting, ask your health care provider.  Work with a Social worker or diabetes educator to identify strategies to manage stress and any emotional and social challenges. What are some questions to ask my health care provider?  Do I need to meet with a diabetes educator?  Do I need to meet with a dietitian?  What number can I call if I have questions?  When are the best times to check my blood glucose? Where to find more information:  American Diabetes Association: diabetes.org/food-and-fitness/food  Academy of Nutrition and Dietetics: PokerClues.dk  [employer] of Diabetes and Digestive and Kidney Diseases (NIH): ContactWire.be Summary  A healthy meal plan will help you control your blood glucose and  maintain a healthy lifestyle.  Working with a diet and nutrition specialist (dietitian) can help you make a meal plan that is best for you.  Keep in mind that carbohydrates and alcohol have immediate effects on your blood glucose levels. It is important to count carbohydrates and to use alcohol carefully. This information is not intended to replace advice given to you by your health care provider. Make sure you discuss any questions you have with your health care provider. Document Released: 11/15/2004 Document Revised: 03/25/2016 Document Reviewed: 03/25/2016 Elsevier Interactive Patient Education  2018 [employer].  How to Avoid Diabetes Mellitus Problems You can take action to prevent or slow down problems that are caused by diabetes (diabetes mellitus). Following your diabetes plan and taking care of yourself can reduce your risk of serious or life-threatening complications. Manage your diabetes  Follow instructions from your health care providers about managing your diabetes. Your diabetes may be managed by a team of health care providers who can teach you how to care for yourself and can answer questions that you have.  Educate yourself about your condition so you can make healthy choices about eating and physical activity.  Check your blood sugar (glucose) levels as often as directed. Your health care provider will help you decide how often to check your blood glucose level depending on your treatment goals and how well you are meeting them.  Ask your health care provider if you should take low-dose aspirin daily and what dose is recommended for you. Taking low-dose aspirin daily is recommended to help prevent cardiovascular disease. Do not use nicotine or tobacco Do not use any products that contain nicotine or tobacco, such as cigarettes and e-cigarettes. If you need help quitting, ask your health care provider. Nicotine raises your risk for diabetes problems. If you quit using  nicotine:  You will lower your risk for heart attack, stroke, nerve disease, and kidney disease.  Your cholesterol and blood pressure may improve.  Your blood circulation will improve.  Keep your blood pressure under control To control your blood pressure:  Follow instructions from your health care provider about meal planning, exercise, and medicines.  Make sure your health care provider checks your blood pressure at every medical visit.  A blood pressure reading consists of two numbers. Generally, the goal is to keep your top number (systolic pressure) at or below 130, and your bottom number (diastolic pressure)  at or below 80. Your health care provider may recommend a lower target blood pressure. Your individualized target blood pressure is determined based on:  Your age.  Your medicines.  How long you have had diabetes.  Any other medical conditions you have.  Keep your cholesterol under control To control your cholesterol:  Follow instructions from your health care provider about meal planning, exercise, and medicines.  Have your cholesterol checked at least once a year.  You may be prescribed medicine to lower cholesterol (statin). If you are not taking a statin, ask your health care provider if you should be.  Controlling your cholesterol may:  Help prevent heart disease and stroke. These are the most common health problems for people with diabetes.  Improve your blood flow.  Schedule and keep yearly physical exams and eye exams Your health care provider will tell you how often you need medical visits depending on your diabetes management plan. Keep all follow-up visits as directed. This is important so possible problems can be identified early and complications can be avoided or treated.  Every visit with your health care provider should include measuring your: ? Weight. ? Blood pressure. ? Blood glucose control.  Your A1c (hemoglobin A1c) level should be  checked: ? At least 2 times a year, if you are meeting your treatment goals. ? 4 times a year, if you are not meeting treatment goals or if your treatment goals have changed.  Your blood lipids (lipid profile) should be checked yearly. You should also be checked yearly for protein in your urine (urine microalbumin).  If you have type 1 diabetes, get an eye exam 3-5 years after you are diagnosed, and then once a year after your first exam.  If you have type 2 diabetes, get an eye exam as soon as you are diagnosed, and then once a year after your first exam.  Keep your vaccines current It is recommended that you receive:  A flu (influenza) vaccine every year.  A pneumonia (pneumococcal) vaccine and a hepatitis B vaccine. If you are age 68 or older, you may get the pneumonia vaccine as a series of two separate shots.  Ask your health care provider which other vaccines may be recommended. Take care of your feet Diabetes may cause you to have poor blood circulation to your legs and feet. Because of this, taking care of your feet is very important. Diabetes can cause:  The skin on the feet to get thinner, break more easily, and heal more slowly.  Nerve damage in your legs and feet, which results in decreased feeling. You may not notice minor injuries that could lead to serious problems.  To avoid foot problems:  Check your skin and feet every day for cuts, bruises, redness, blisters, or sores.  Schedule a foot exam with your health care provider once every year. This exam includes: ? Inspecting of the structure and skin of your feet. ? Checking the pulses and sensation in your feet.  Make sure that your health care provider performs a visual foot exam at every medical visit.  Take care of your teeth People with poorly controlled diabetes are more likely to have gum (periodontal) disease. Diabetes can make periodontal diseases harder to control. If not treated, periodontal diseases can  lead to tooth loss. To prevent this:  Brush your teeth twice a day.  Floss at least once a day.  Visit your dentist 2 times a year.  Drink responsibly Limit alcohol intake to no  more than 1 drink a day for nonpregnant women and 2 drinks a day for men. One drink equals 12 oz of beer, 5 oz of wine, or 1 oz of hard liquor. It is important to eat food when you drink alcohol to avoid low blood glucose (hypoglycemia). Avoid alcohol if you:  Have a history of alcohol abuse or dependence.  Are pregnant.  Have liver disease, pancreatitis, advanced neuropathy, or severe hypertriglyceridemia.  Lessen stress Living with diabetes can be stressful. When you are experiencing stress, your blood glucose may be affected in two ways:  Stress hormones may cause your blood glucose to rise.  You may be distracted from taking good care of yourself.  Be aware of your stress level and make changes to help you manage challenging situations. To lower your stress levels:  Consider joining a support group.  Do planned relaxation or meditation.  Do a hobby that you enjoy.  Maintain healthy relationships.  Exercise regularly.  Work with your health care provider or a mental health professional.  Summary  You can take action to prevent or slow down problems that are caused by diabetes (diabetes mellitus). Following your diabetes plan and taking care of yourself can reduce your risk of serious or life-threatening complications.  Follow instructions from your health care providers about managing your diabetes. Your diabetes may be managed by a team of health care providers who can teach you how to care for yourself and can answer questions that you have.  Your health care provider will tell you how often you need medical visits depending on your diabetes management plan. Keep all follow-up visits as directed. This is important so possible problems can be identified early and complications can be avoided  or treated. This information is not intended to replace advice given to you by your health care provider. Make sure you discuss any questions you have with your health care provider. Document Released: 11/06/2010 Document Revised: 11/18/2015 Document Reviewed: 11/18/2015 Elsevier Interactive Patient Education  [employer].

## 2017-12-30 NOTE — Assessment & Plan Note (Signed)
Newly diagnosed today with A1c of 10.0. Will start metformin and get into lifestyle center and ophthalmologist. Recheck 1 month for tolerance on metformin. Call with any concerns.

## 2017-12-30 NOTE — Telephone Encounter (Signed)
Copied from [place] [id number]. Topic: Quick Communication - Rx Refill/Question >> Dec 30, 2017  2:41 PM Margot Ables wrote: Medication: blood sugar monitor and supplies  Has the patient contacted their pharmacy? Yes - pharmacy does not have RX Preferred Pharmacy (with phone number or street name): [facility] [address] [place], [place] - [place] [phone] (Phone) [phone] (Fax)

## 2017-12-30 NOTE — Telephone Encounter (Signed)
Faxed this AM

## 2017-12-30 NOTE — Progress Notes (Signed)
BP (!) 158/100   Pulse 85   Wt 221 lb (100.2 kg)   SpO2 97%   BMI 31.71 kg/m    Subjective:    Patient ID: Kristopher [name], male    DOB: Dec 06, 1960, 57 y.o.   MRN: [id number]  HPI: Kristopher [name] is a 57 y.o. male  Chief Complaint  Patient presents with  . Follow-up    Discuss glucose   DIABETES Hypoglycemic episodes:no Polydipsia/polyuria: yes Visual disturbance: yes Chest pain: yes Paresthesias: yes Glucose Monitoring: no Taking Insulin?: no Blood Pressure Monitoring: not checking Retinal Examination: Not up to Date Foot Exam: Up to Date Diabetic Education: Not Completed Pneumovax: Up to Date Influenza: Refused Aspirin: no- allergic  Relevant past medical, surgical, family and social history reviewed and updated as indicated. Interim medical history since our last visit reviewed. Allergies and medications reviewed and updated.  Review of Systems  Constitutional: Negative.   Eyes: Positive for visual disturbance. Negative for photophobia, pain, discharge, redness and itching.  Respiratory: Negative.   Cardiovascular: Positive for chest pain (occasionally). Negative for palpitations and leg swelling.  Neurological: Positive for numbness and headaches. Negative for dizziness, tremors, seizures, syncope, facial asymmetry, speech difficulty, weakness and light-headedness.  Psychiatric/Behavioral: Negative.     Per HPI unless specifically indicated above     Objective:    BP (!) 158/100   Pulse 85   Wt 221 lb (100.2 kg)   SpO2 97%   BMI 31.71 kg/m   Wt Readings from Last 3 Encounters:  12/30/17 221 lb (100.2 kg)  12/25/17 223 lb 4.8 oz (101.3 kg)  12/02/17 238 lb (108 kg)    Physical Exam  Constitutional: He is oriented to person, place, and time. He appears well-developed and well-nourished. No distress.  HENT:  Head: Normocephalic and atraumatic.  Right Ear: Hearing normal.  Left Ear: Hearing normal.  Nose: Nose normal.  Eyes: Conjunctivae and  lids are normal. Right eye exhibits no discharge. Left eye exhibits no discharge. No scleral icterus.  Cardiovascular: Normal rate, regular rhythm, normal heart sounds and intact distal pulses. Exam reveals no gallop and no friction rub.  No murmur heard. Pulmonary/Chest: Effort normal and breath sounds normal. No stridor. No respiratory distress. He has no wheezes. He has no rales. He exhibits no tenderness.  Musculoskeletal: Normal range of motion.  Neurological: He is alert and oriented to person, place, and time.  Skin: Skin is intact. No rash noted. He is not diaphoretic.  Psychiatric: He has a normal mood and affect. His speech is normal and behavior is normal. Judgment and thought content normal. Cognition and memory are normal.  Nursing note and vitals reviewed.   Results for orders placed or performed in visit on 53/61/44  Basic metabolic panel  Result Value Ref Range   Glucose 384 (H) 65 - 99 mg/dL   BUN 11 6 - 24 mg/dL   Creatinine, Ser 0.93 0.76 - 1.27 mg/dL   GFR calc non Af Amer 92 >59 mL/min/1.73   GFR calc Af Amer 106 >59 mL/min/1.73   BUN/Creatinine Ratio 12 9 - 20   Sodium 136 134 - 144 mmol/L   Potassium 4.2 3.5 - 5.2 mmol/L   Chloride 95 (L) 96 - 106 mmol/L   CO2 23 20 - 29 mmol/L   Calcium 9.5 8.7 - 10.2 mg/dL      Assessment & Plan:   Problem List Items Addressed This Visit      Endocrine   Type 2 diabetes  mellitus with stage 1 chronic kidney disease, without long-term current use of insulin ([place]) - Primary    Newly diagnosed today with A1c of 10.0. Will start metformin and get into lifestyle center and ophthalmologist. Recheck 1 month for tolerance on metformin. Call with any concerns.       Relevant Medications   metFORMIN (GLUCOPHAGE-XR) 500 MG 24 hr tablet   Other Relevant Orders   Ambulatory referral to Ophthalmology   Ambulatory referral to diabetic education    Other Visit Diagnoses    Blood glucose elevated       Relevant Orders   Bayer DCA  Hb A1c Waived       Follow up plan: Return in about 4 weeks (around 01/27/2018) for Check tolerance of metformin.

## 2017-12-31 DIAGNOSIS — M1612 Unilateral primary osteoarthritis, left hip: Secondary | ICD-10-CM | POA: Diagnosis not present

## 2017-12-31 NOTE — Telephone Encounter (Signed)
RX re-faxed to [facility].

## 2017-12-31 NOTE — Telephone Encounter (Signed)
Called pharmacy, they did not receive the prescription. Will refax.

## 2017-12-31 NOTE — Telephone Encounter (Signed)
Can we double check they got the fax?

## 2018-01-01 DIAGNOSIS — G43909 Migraine, unspecified, not intractable, without status migrainosus: Secondary | ICD-10-CM | POA: Diagnosis not present

## 2018-01-01 DIAGNOSIS — Z9181 History of falling: Secondary | ICD-10-CM | POA: Diagnosis not present

## 2018-01-01 DIAGNOSIS — M47816 Spondylosis without myelopathy or radiculopathy, lumbar region: Secondary | ICD-10-CM | POA: Diagnosis not present

## 2018-01-01 DIAGNOSIS — M5020 Other cervical disc displacement, unspecified cervical region: Secondary | ICD-10-CM | POA: Diagnosis not present

## 2018-01-01 DIAGNOSIS — I10 Essential (primary) hypertension: Secondary | ICD-10-CM | POA: Diagnosis not present

## 2018-01-01 DIAGNOSIS — M5126 Other intervertebral disc displacement, lumbar region: Secondary | ICD-10-CM | POA: Diagnosis not present

## 2018-01-01 DIAGNOSIS — Z471 Aftercare following joint replacement surgery: Secondary | ICD-10-CM | POA: Diagnosis not present

## 2018-01-01 DIAGNOSIS — M48061 Spinal stenosis, lumbar region without neurogenic claudication: Secondary | ICD-10-CM | POA: Diagnosis not present

## 2018-01-01 DIAGNOSIS — I700 Atherosclerosis of aorta: Secondary | ICD-10-CM | POA: Diagnosis not present

## 2018-01-01 DIAGNOSIS — M4802 Spinal stenosis, cervical region: Secondary | ICD-10-CM | POA: Diagnosis not present

## 2018-01-01 DIAGNOSIS — G894 Chronic pain syndrome: Secondary | ICD-10-CM | POA: Diagnosis not present

## 2018-01-01 DIAGNOSIS — M961 Postlaminectomy syndrome, not elsewhere classified: Secondary | ICD-10-CM | POA: Diagnosis not present

## 2018-01-01 DIAGNOSIS — M461 Sacroiliitis, not elsewhere classified: Secondary | ICD-10-CM | POA: Diagnosis not present

## 2018-01-01 DIAGNOSIS — M19072 Primary osteoarthritis, left ankle and foot: Secondary | ICD-10-CM | POA: Diagnosis not present

## 2018-01-01 DIAGNOSIS — G8194 Hemiplegia, unspecified affecting left nondominant side: Secondary | ICD-10-CM | POA: Diagnosis not present

## 2018-01-01 DIAGNOSIS — Z96642 Presence of left artificial hip joint: Secondary | ICD-10-CM | POA: Diagnosis not present

## 2018-01-01 DIAGNOSIS — Z85038 Personal history of other malignant neoplasm of large intestine: Secondary | ICD-10-CM | POA: Diagnosis not present

## 2018-01-05 DIAGNOSIS — G894 Chronic pain syndrome: Secondary | ICD-10-CM | POA: Diagnosis not present

## 2018-01-05 DIAGNOSIS — G8194 Hemiplegia, unspecified affecting left nondominant side: Secondary | ICD-10-CM | POA: Diagnosis not present

## 2018-01-05 DIAGNOSIS — Z9181 History of falling: Secondary | ICD-10-CM | POA: Diagnosis not present

## 2018-01-05 DIAGNOSIS — I10 Essential (primary) hypertension: Secondary | ICD-10-CM | POA: Diagnosis not present

## 2018-01-05 DIAGNOSIS — M4802 Spinal stenosis, cervical region: Secondary | ICD-10-CM | POA: Diagnosis not present

## 2018-01-05 DIAGNOSIS — Z85038 Personal history of other malignant neoplasm of large intestine: Secondary | ICD-10-CM | POA: Diagnosis not present

## 2018-01-05 DIAGNOSIS — M47816 Spondylosis without myelopathy or radiculopathy, lumbar region: Secondary | ICD-10-CM | POA: Diagnosis not present

## 2018-01-05 DIAGNOSIS — M5126 Other intervertebral disc displacement, lumbar region: Secondary | ICD-10-CM | POA: Diagnosis not present

## 2018-01-05 DIAGNOSIS — G43909 Migraine, unspecified, not intractable, without status migrainosus: Secondary | ICD-10-CM | POA: Diagnosis not present

## 2018-01-05 DIAGNOSIS — M48061 Spinal stenosis, lumbar region without neurogenic claudication: Secondary | ICD-10-CM | POA: Diagnosis not present

## 2018-01-05 DIAGNOSIS — M461 Sacroiliitis, not elsewhere classified: Secondary | ICD-10-CM | POA: Diagnosis not present

## 2018-01-05 DIAGNOSIS — Z471 Aftercare following joint replacement surgery: Secondary | ICD-10-CM | POA: Diagnosis not present

## 2018-01-05 DIAGNOSIS — M19072 Primary osteoarthritis, left ankle and foot: Secondary | ICD-10-CM | POA: Diagnosis not present

## 2018-01-05 DIAGNOSIS — I700 Atherosclerosis of aorta: Secondary | ICD-10-CM | POA: Diagnosis not present

## 2018-01-05 DIAGNOSIS — Z96642 Presence of left artificial hip joint: Secondary | ICD-10-CM | POA: Diagnosis not present

## 2018-01-05 DIAGNOSIS — M5020 Other cervical disc displacement, unspecified cervical region: Secondary | ICD-10-CM | POA: Diagnosis not present

## 2018-01-05 DIAGNOSIS — M961 Postlaminectomy syndrome, not elsewhere classified: Secondary | ICD-10-CM | POA: Diagnosis not present

## 2018-01-06 ENCOUNTER — Other Ambulatory Visit: Payer: Self-pay

## 2018-01-06 ENCOUNTER — Encounter: Payer: Self-pay | Admitting: *Deleted

## 2018-01-08 DIAGNOSIS — G43909 Migraine, unspecified, not intractable, without status migrainosus: Secondary | ICD-10-CM | POA: Diagnosis not present

## 2018-01-08 DIAGNOSIS — M19072 Primary osteoarthritis, left ankle and foot: Secondary | ICD-10-CM | POA: Diagnosis not present

## 2018-01-08 DIAGNOSIS — Z85038 Personal history of other malignant neoplasm of large intestine: Secondary | ICD-10-CM | POA: Diagnosis not present

## 2018-01-08 DIAGNOSIS — M461 Sacroiliitis, not elsewhere classified: Secondary | ICD-10-CM | POA: Diagnosis not present

## 2018-01-08 DIAGNOSIS — Z96642 Presence of left artificial hip joint: Secondary | ICD-10-CM | POA: Diagnosis not present

## 2018-01-08 DIAGNOSIS — M5126 Other intervertebral disc displacement, lumbar region: Secondary | ICD-10-CM | POA: Diagnosis not present

## 2018-01-08 DIAGNOSIS — M48061 Spinal stenosis, lumbar region without neurogenic claudication: Secondary | ICD-10-CM | POA: Diagnosis not present

## 2018-01-08 DIAGNOSIS — Z9181 History of falling: Secondary | ICD-10-CM | POA: Diagnosis not present

## 2018-01-08 DIAGNOSIS — M4802 Spinal stenosis, cervical region: Secondary | ICD-10-CM | POA: Diagnosis not present

## 2018-01-08 DIAGNOSIS — M961 Postlaminectomy syndrome, not elsewhere classified: Secondary | ICD-10-CM | POA: Diagnosis not present

## 2018-01-08 DIAGNOSIS — I700 Atherosclerosis of aorta: Secondary | ICD-10-CM | POA: Diagnosis not present

## 2018-01-08 DIAGNOSIS — M5020 Other cervical disc displacement, unspecified cervical region: Secondary | ICD-10-CM | POA: Diagnosis not present

## 2018-01-08 DIAGNOSIS — G894 Chronic pain syndrome: Secondary | ICD-10-CM | POA: Diagnosis not present

## 2018-01-08 DIAGNOSIS — Z471 Aftercare following joint replacement surgery: Secondary | ICD-10-CM | POA: Diagnosis not present

## 2018-01-08 DIAGNOSIS — I10 Essential (primary) hypertension: Secondary | ICD-10-CM | POA: Diagnosis not present

## 2018-01-08 DIAGNOSIS — M47816 Spondylosis without myelopathy or radiculopathy, lumbar region: Secondary | ICD-10-CM | POA: Diagnosis not present

## 2018-01-08 DIAGNOSIS — G8194 Hemiplegia, unspecified affecting left nondominant side: Secondary | ICD-10-CM | POA: Diagnosis not present

## 2018-01-08 NOTE — Discharge Instructions (Signed)
General Anesthesia, Adult, Care After °These instructions provide you with information about caring for yourself after your procedure. Your health care provider may also give you more specific instructions. Your treatment has been planned according to current medical practices, but problems sometimes occur. Call your health care provider if you have any problems or questions after your procedure. °What can I expect after the procedure? °After the procedure, it is common to have: °· Vomiting. °· A sore throat. °· Mental slowness. ° °It is common to feel: °· Nauseous. °· Cold or shivery. °· Sleepy. °· Tired. °· Sore or achy, even in parts of your body where you did not have surgery. ° °Follow these instructions at home: °For at least 24 hours after the procedure: °· Do not: °? Participate in activities where you could fall or become injured. °? Drive. °? Use heavy machinery. °? Drink alcohol. °? Take sleeping pills or medicines that cause drowsiness. °? Make important decisions or sign legal documents. °? Take care of children on your own. °· Rest. °Eating and drinking °· If you vomit, drink water, juice, or soup when you can drink without vomiting. °· Drink enough fluid to keep your urine clear or pale yellow. °· Make sure you have little or no nausea before eating solid foods. °· Follow the diet recommended by your health care provider. °General instructions °· Have a responsible adult stay with you until you are awake and alert. °· Return to your normal activities as told by your health care provider. Ask your health care provider what activities are safe for you. °· Take over-the-counter and prescription medicines only as told by your health care provider. °· If you smoke, do not smoke without supervision. °· Keep all follow-up visits as told by your health care provider. This is important. °Contact a health care provider if: °· You continue to have nausea or vomiting at home, and medicines are not helpful. °· You  cannot drink fluids or start eating again. °· You cannot urinate after 8-12 hours. °· You develop a skin rash. °· You have fever. °· You have increasing redness at the site of your procedure. °Get help right away if: °· You have difficulty breathing. °· You have chest pain. °· You have unexpected bleeding. °· You feel that you are having a life-threatening or urgent problem. °This information is not intended to replace advice given to you by your health care provider. Make sure you discuss any questions you have with your health care provider. °Document Released: 05/27/2000 Document Revised: 07/24/2015 Document Reviewed: 02/02/2015 °Elsevier Interactive Patient Education © 2018 [employer] ° °

## 2018-01-15 DIAGNOSIS — M4802 Spinal stenosis, cervical region: Secondary | ICD-10-CM | POA: Diagnosis not present

## 2018-01-15 DIAGNOSIS — Z96642 Presence of left artificial hip joint: Secondary | ICD-10-CM | POA: Diagnosis not present

## 2018-01-15 DIAGNOSIS — Z9181 History of falling: Secondary | ICD-10-CM | POA: Diagnosis not present

## 2018-01-15 DIAGNOSIS — I10 Essential (primary) hypertension: Secondary | ICD-10-CM | POA: Diagnosis not present

## 2018-01-15 DIAGNOSIS — M961 Postlaminectomy syndrome, not elsewhere classified: Secondary | ICD-10-CM | POA: Diagnosis not present

## 2018-01-15 DIAGNOSIS — G894 Chronic pain syndrome: Secondary | ICD-10-CM | POA: Diagnosis not present

## 2018-01-15 DIAGNOSIS — Z85038 Personal history of other malignant neoplasm of large intestine: Secondary | ICD-10-CM | POA: Diagnosis not present

## 2018-01-15 DIAGNOSIS — G43909 Migraine, unspecified, not intractable, without status migrainosus: Secondary | ICD-10-CM | POA: Diagnosis not present

## 2018-01-15 DIAGNOSIS — M5126 Other intervertebral disc displacement, lumbar region: Secondary | ICD-10-CM | POA: Diagnosis not present

## 2018-01-15 DIAGNOSIS — G8194 Hemiplegia, unspecified affecting left nondominant side: Secondary | ICD-10-CM | POA: Diagnosis not present

## 2018-01-15 DIAGNOSIS — M47816 Spondylosis without myelopathy or radiculopathy, lumbar region: Secondary | ICD-10-CM | POA: Diagnosis not present

## 2018-01-15 DIAGNOSIS — Z471 Aftercare following joint replacement surgery: Secondary | ICD-10-CM | POA: Diagnosis not present

## 2018-01-15 DIAGNOSIS — M48061 Spinal stenosis, lumbar region without neurogenic claudication: Secondary | ICD-10-CM | POA: Diagnosis not present

## 2018-01-15 DIAGNOSIS — I700 Atherosclerosis of aorta: Secondary | ICD-10-CM | POA: Diagnosis not present

## 2018-01-15 DIAGNOSIS — M19072 Primary osteoarthritis, left ankle and foot: Secondary | ICD-10-CM | POA: Diagnosis not present

## 2018-01-15 DIAGNOSIS — M461 Sacroiliitis, not elsewhere classified: Secondary | ICD-10-CM | POA: Diagnosis not present

## 2018-01-15 DIAGNOSIS — M5020 Other cervical disc displacement, unspecified cervical region: Secondary | ICD-10-CM | POA: Diagnosis not present

## 2018-01-19 ENCOUNTER — Ambulatory Visit
Admission: RE | Admit: 2018-01-19 | Discharge: 2018-01-19 | Disposition: A | Discharge status: Home / Self Care | Payer: Medicare Other | Source: Ambulatory Visit | Attending: Gastroenterology | Admitting: Gastroenterology

## 2018-01-19 ENCOUNTER — Ambulatory Visit: Payer: Medicare Other | Admitting: Anesthesiology

## 2018-01-19 ENCOUNTER — Ambulatory Visit
Admission: RE | Disposition: A | Discharge status: Home / Self Care | Payer: Self-pay | Source: Ambulatory Visit | Attending: Gastroenterology

## 2018-01-19 DIAGNOSIS — Z1211 Encounter for screening for malignant neoplasm of colon: Secondary | ICD-10-CM | POA: Diagnosis not present

## 2018-01-19 DIAGNOSIS — Z96642 Presence of left artificial hip joint: Secondary | ICD-10-CM | POA: Insufficient documentation

## 2018-01-19 DIAGNOSIS — F17210 Nicotine dependence, cigarettes, uncomplicated: Secondary | ICD-10-CM | POA: Diagnosis not present

## 2018-01-19 DIAGNOSIS — G43909 Migraine, unspecified, not intractable, without status migrainosus: Secondary | ICD-10-CM | POA: Insufficient documentation

## 2018-01-19 DIAGNOSIS — Z7984 Long term (current) use of oral hypoglycemic drugs: Secondary | ICD-10-CM | POA: Diagnosis not present

## 2018-01-19 DIAGNOSIS — K641 Second degree hemorrhoids: Secondary | ICD-10-CM | POA: Insufficient documentation

## 2018-01-19 DIAGNOSIS — I10 Essential (primary) hypertension: Secondary | ICD-10-CM | POA: Diagnosis not present

## 2018-01-19 DIAGNOSIS — Z86010 Personal history of colonic polyps: Secondary | ICD-10-CM | POA: Diagnosis not present

## 2018-01-19 DIAGNOSIS — D125 Benign neoplasm of sigmoid colon: Secondary | ICD-10-CM | POA: Diagnosis not present

## 2018-01-19 DIAGNOSIS — Z09 Encounter for follow-up examination after completed treatment for conditions other than malignant neoplasm: Secondary | ICD-10-CM | POA: Diagnosis present

## 2018-01-19 DIAGNOSIS — Z79899 Other long term (current) drug therapy: Secondary | ICD-10-CM | POA: Insufficient documentation

## 2018-01-19 DIAGNOSIS — G8929 Other chronic pain: Secondary | ICD-10-CM | POA: Diagnosis not present

## 2018-01-19 DIAGNOSIS — Z79891 Long term (current) use of opiate analgesic: Secondary | ICD-10-CM | POA: Insufficient documentation

## 2018-01-19 DIAGNOSIS — E669 Obesity, unspecified: Secondary | ICD-10-CM | POA: Diagnosis not present

## 2018-01-19 DIAGNOSIS — M549 Dorsalgia, unspecified: Secondary | ICD-10-CM | POA: Diagnosis not present

## 2018-01-19 DIAGNOSIS — K219 Gastro-esophageal reflux disease without esophagitis: Secondary | ICD-10-CM | POA: Insufficient documentation

## 2018-01-19 DIAGNOSIS — Z6831 Body mass index (BMI) 31.0-31.9, adult: Secondary | ICD-10-CM | POA: Diagnosis not present

## 2018-01-19 DIAGNOSIS — K635 Polyp of colon: Secondary | ICD-10-CM | POA: Insufficient documentation

## 2018-01-19 DIAGNOSIS — E119 Type 2 diabetes mellitus without complications: Secondary | ICD-10-CM | POA: Insufficient documentation

## 2018-01-19 HISTORY — DX: Type 2 diabetes mellitus without complications: E11.9

## 2018-01-19 HISTORY — DX: Hypertensive chronic kidney disease with stage 1 through stage 4 chronic kidney disease, or unspecified chronic kidney disease: I12.9

## 2018-01-19 HISTORY — PX: COLONOSCOPY WITH PROPOFOL: SHX5780

## 2018-01-19 HISTORY — PX: POLYPECTOMY: SHX5525

## 2018-01-19 LAB — GLUCOSE, CAPILLARY
Glucose-Capillary: 339 mg/dL — ABNORMAL HIGH (ref 70–99)
Glucose-Capillary: 341 mg/dL — ABNORMAL HIGH (ref 70–99)

## 2018-01-19 SURGERY — COLONOSCOPY WITH PROPOFOL
Anesthesia: General | Site: Rectum

## 2018-01-19 MED ORDER — ONDANSETRON HCL 4 MG/2ML IJ SOLN: 4.0000 mg | Freq: Once | INTRAMUSCULAR | Status: DC | PRN

## 2018-01-19 MED ORDER — DEXMEDETOMIDINE HCL 200 MCG/2ML IV SOLN
INTRAVENOUS | Status: DC | PRN
  Administered 2018-01-19: 8 ug via INTRAVENOUS

## 2018-01-19 MED ORDER — LACTATED RINGERS IV SOLN
10.0000 mL/h | INTRAVENOUS | Status: DC
  Administered 2018-01-19: 10 mL/h via INTRAVENOUS

## 2018-01-19 MED ORDER — SODIUM CHLORIDE 0.9 % IV SOLN: INTRAVENOUS | Status: DC

## 2018-01-19 MED ORDER — STERILE WATER FOR IRRIGATION IR SOLN
Status: DC | PRN
  Administered 2018-01-19: 08:00:00

## 2018-01-19 MED ORDER — NON FORMULARY
5.0000 [IU] | Freq: Once | Status: AC
  Administered 2018-01-19: 5 [IU] via SUBCUTANEOUS

## 2018-01-19 MED ORDER — PROPOFOL 10 MG/ML IV BOLUS
INTRAVENOUS | Status: DC | PRN
  Administered 2018-01-19: 30 mg via INTRAVENOUS
  Administered 2018-01-19: 20 mg via INTRAVENOUS
  Administered 2018-01-19 (×2): 30 mg via INTRAVENOUS
  Administered 2018-01-19: 20 mg via INTRAVENOUS
  Administered 2018-01-19: 50 mg via INTRAVENOUS
  Administered 2018-01-19: 20 mg via INTRAVENOUS
  Administered 2018-01-19: 100 mg via INTRAVENOUS

## 2018-01-19 SURGICAL SUPPLY — 7 items
CANISTER SUCT 1200ML W/VALVE (MISCELLANEOUS) ×3 IMPLANT
GOWN CVR UNV OPN BCK APRN NK (MISCELLANEOUS) ×4 IMPLANT
GOWN ISOL THUMB LOOP REG UNIV (MISCELLANEOUS) ×2
KIT ENDO PROCEDURE OLY (KITS) ×3 IMPLANT
SNARE SHORT THROW 13M SML OVAL (MISCELLANEOUS) ×3 IMPLANT
TRAP ETRAP POLY (MISCELLANEOUS) ×3 IMPLANT
WATER STERILE IRR 250ML POUR (IV SOLUTION) ×3 IMPLANT

## 2018-01-19 NOTE — Anesthesia Preprocedure Evaluation (Signed)
Anesthesia Evaluation  Patient identified by MRN, date of birth, ID band Patient awake    Reviewed: Allergy & Precautions, NPO status , Patient's Chart, lab work & pertinent test results  Airway Mallampati: II  TM Distance: >3 FB     Dental   Pulmonary Current Smoker,    breath sounds clear to auscultation       Cardiovascular hypertension,  Rhythm:Regular Rate:Normal     Neuro/Psych  Headaches,    GI/Hepatic GERD  ,  Endo/Other  diabetes, Type 2Obesity - BMI 32  Renal/GU      Musculoskeletal  (+) Arthritis ,   Abdominal   Peds  Hematology   Anesthesia Other Findings   Reproductive/Obstetrics                             Anesthesia Physical Anesthesia Plan  ASA: III  Anesthesia Plan: General   Post-op Pain Management:    Induction: Intravenous  PONV Risk Score and Plan:   Airway Management Planned: Natural Airway and Nasal Cannula  Additional Equipment:   Intra-op Plan:   Post-operative Plan:   Informed Consent: I have reviewed the patients History and Physical, chart, labs and discussed the procedure including the risks, benefits and alternatives for the proposed anesthesia with the patient or authorized representative who has indicated his/her understanding and acceptance.     Plan Discussed with: CRNA  Anesthesia Plan Comments:         Anesthesia Quick Evaluation

## 2018-01-19 NOTE — Op Note (Signed)
[facility] Patient Name: Kristopher [name] Procedure Date: 01/19/2018 7:18 AM MRN: [id number] Account #: [id number] Date of Birth: 1960-08-19 Admit Type: Outpatient Age: 57 Room: [facility] OR ROOM 01 Gender: Male Note Status: Finalized Procedure:            Colonoscopy Indications:          High risk colon cancer surveillance: Personal history                        of colonic polyps 02/2016 Providers:            Lucilla Lame MD, MD Referring MD:         [name] (Referring MD) Medicines:            Propofol per Anesthesia Complications:        No immediate complications. Procedure:            Pre-Anesthesia Assessment:                       - Prior to the procedure, a History and Physical was                        performed, and patient medications and allergies were                        reviewed. The patient's tolerance of previous                        anesthesia was also reviewed. The risks and benefits of                        the procedure and the sedation options and risks were                        discussed with the patient. All questions were                        answered, and informed consent was obtained. Prior                        Anticoagulants: The patient has taken no previous                        anticoagulant or antiplatelet agents. ASA Grade                        Assessment: II - A patient with mild systemic disease.                        After reviewing the risks and benefits, the patient was                        deemed in satisfactory condition to undergo the                        procedure.                       After obtaining informed consent, the colonoscope was  passed under direct vision. Throughout the procedure,                        the patient's blood pressure, pulse, and oxygen                        saturations were monitored continuously. The was   introduced through the anus and advanced to the the                        cecum, identified by appendiceal orifice and ileocecal                        valve. The colonoscopy was performed without                        difficulty. The patient tolerated the procedure well.                        The quality of the bowel preparation was fair. The                        patient tolerated the procedure well. Findings:      The perianal and digital rectal examinations were normal.      Five sessile polyps were found in the sigmoid colon. The polyps were 2       to 5 mm in size. These polyps were removed with a cold snare. Resection       and retrieval were complete.      Non-bleeding internal hemorrhoids were found during retroflexion. The       hemorrhoids were Grade II (internal hemorrhoids that prolapse but reduce       spontaneously). Impression:           - Preparation of the colon was fair.                       - Five 2 to 5 mm polyps in the sigmoid colon, removed                        with a cold snare. Resected and retrieved.                       - Non-bleeding internal hemorrhoids. Recommendation:       - Discharge patient to home.                       - Resume previous diet.                       - Continue present medications.                       - Await pathology results.                       - Repeat colonoscopy in 3 years for surveillance. Procedure Code(s):    --- Professional ---                       [phone], Colonoscopy, flexible; with removal of tumor(s),  polyp(s), or other lesion(s) by snare technique Diagnosis Code(s):    --- Professional ---                       Z86.010, Personal history of colonic polyps                       D12.5, Benign neoplasm of sigmoid colon CPT copyright 2018 American Medical Association. All rights reserved. The codes documented in this report are preliminary and upon coder review may  be revised to meet current  compliance requirements. Lucilla Lame MD, MD 01/19/2018 8:05:09 AM This report has been signed electronically. Number of Addenda: 0 Note Initiated On: 01/19/2018 7:18 AM Scope Withdrawal Time: 0 hours 10 minutes 34 seconds  Total Procedure Duration: 0 hours 13 minutes 25 seconds       [facility]

## 2018-01-19 NOTE — Transfer of Care (Signed)
Immediate Anesthesia Transfer of Care Note  Patient: Kristopher [name]  Procedure(s) Performed: COLONOSCOPY WITH PROPOFOL (N/A Rectum) POLYPECTOMY (Rectum)  Patient Location: PACU  Anesthesia Type: General  Level of Consciousness: awake, alert  and patient cooperative  Airway and Oxygen Therapy: Patient Spontanous Breathing and Patient connected to supplemental oxygen  Post-op Assessment: Post-op Vital signs reviewed, Patient's Cardiovascular Status Stable, Respiratory Function Stable, Patent Airway and No signs of Nausea or vomiting  Post-op Vital Signs: Reviewed and stable  Complications: No apparent anesthesia complications

## 2018-01-19 NOTE — Anesthesia Postprocedure Evaluation (Signed)
Anesthesia Post Note  Patient: Kristopher [name]  Procedure(s) Performed: COLONOSCOPY WITH PROPOFOL (N/A Rectum) POLYPECTOMY (Rectum)  Patient location during evaluation: PACU Anesthesia Type: General Level of consciousness: awake Pain management: pain level controlled Vital Signs Assessment: post-procedure vital signs reviewed and stable Respiratory status: respiratory function stable Cardiovascular status: stable Postop Assessment: no signs of nausea or vomiting Anesthetic complications: no    Veda Canning

## 2018-01-19 NOTE — H&P (Signed)
Lucilla Lame, MD [address] [place], [place] [address] Phone:[phone] Fax : [phone]  Primary Care Physician:  [name], DO Primary Gastroenterologist:  Dr. Allen Norris  Pre-Procedure History & Physical: HPI:  Kristopher [name] is a 57 y.o. male is here for an colonoscopy.   Past Medical History:  Diagnosis Date  . Allergy   . Arthritis    left foot  . Benign hypertensive kidney disease   . Chronic back pain    Four rods in back  . Diabetes mellitus, type 2 ([place])   . Hypertension   . Migraines    daily    Past Surgical History:  Procedure Laterality Date  . APPENDECTOMY    . BACK SURGERY    . COLONOSCOPY WITH PROPOFOL N/A 02/19/2016   Procedure: COLONOSCOPY WITH PROPOFOL;  Surgeon: Lucilla Lame, MD;  Location: [facility];  Service: Endoscopy;  Laterality: N/A;  . FOOT SURGERY Left    Screws and plates  . KNEE SURGERY Left    X 2  . LEG SURGERY    . POLYPECTOMY N/A 02/19/2016   Procedure: POLYPECTOMY;  Surgeon: Lucilla Lame, MD;  Location: [facility];  Service: Endoscopy;  Laterality: N/A;  . TOTAL HIP ARTHROPLASTY Left 12/02/2017   Procedure: TOTAL HIP ARTHROPLASTY ANTERIOR APPROACH;  Surgeon: Hessie Knows, MD;  Location: [facility];  Service: Orthopedics;  Laterality: Left;    Prior to Admission medications   Medication Sig Start Date End Date Taking? Authorizing Provider  diphenhydrAMINE (BENADRYL) 25 MG tablet Take 50 mg by mouth 2 (two) times daily.    Yes [provider]  esomeprazole (NEXIUM) 40 MG capsule Take 1 capsule (40 mg total) by mouth daily. 10/01/17  Yes Johnson, [name], DO  hydrALAZINE (APRESOLINE) 25 MG tablet Take 1 tablet (25 mg total) by mouth daily. 12/25/17  Yes Johnson, [name], DO  metFORMIN (GLUCOPHAGE-XR) 500 MG 24 hr tablet Take 2 tablets (1,000 mg total) by mouth 2 (two) times daily with a meal. 12/30/17  Yes Johnson, [name], DO  metoprolol succinate (TOPROL-XL) 100 MG 24 hr tablet Take 1  tablet (100 mg total) by mouth at bedtime. Take with 25 mg to equal 125 mg once daily 12/25/17  Yes Johnson, [name], DO  metoprolol succinate (TOPROL-XL) 25 MG 24 hr tablet TAKE 1 TABLET (25 mg) BY MOUTH WITH A 100 mg TABLET FOR A TOTAL OF (125 mg) DAILY. 12/25/17  Yes Johnson, [name], DO  oxyCODONE (OXY IR/ROXICODONE) 5 MG immediate release tablet Take 1-2 tablets (5-10 mg total) by mouth every 6 (six) hours as needed for severe pain. 12/04/17  Yes Duanne Guess, PA-C  SUMAtriptan (IMITREX) 100 MG tablet TAKE ONE TABLET AT ONSET OF HEADACHE MAY REPEAT IN TWO HOURS IF NECESSARY LIMIT OF TWO IN 24 HOURS 12/05/17  Yes Johnson, [name], DO  amLODipine-valsartan (EXFORGE) 10-320 MG tablet Take 1 tablet by mouth daily. Patient not taking: Reported on 01/06/2018 06/24/16   Park Liter P, DO  baclofen (LIORESAL) 20 MG tablet Take 1 tablet (20 mg total) by mouth at bedtime as needed for muscle spasms. Patient not taking: Reported on 01/06/2018 06/03/17   Park Liter P, DO  docusate sodium (COLACE) 100 MG capsule Take 1 capsule (100 mg total) by mouth 2 (two) times daily. Patient not taking: Reported on 01/06/2018 12/04/17   Duanne Guess, PA-C  doxazosin (CARDURA) 4 MG tablet Take 1 tablet (4 mg total) by mouth daily. Patient  not taking: Reported on 01/06/2018 04/25/17   Minna Merritts, MD  oxyCODONE-acetaminophen (PERCOCET) 10-325 MG tablet Take 1 tablet by mouth every 4 (four) hours as needed for pain.    [provider]  potassium chloride (KLOR-CON) 20 MEQ packet Take 20 mEq by mouth daily.    [provider]    Allergies as of 12/30/2017 - Review Complete 12/30/2017  Allergen Reaction Noted  . Aspirin Anaphylaxis 01/22/2016  . Epinephrine Anaphylaxis 08/19/2017  . Lidocaine Anaphylaxis 07/07/2014  . Novocain [procaine] Anaphylaxis 02/15/2016  . Penicillins Anaphylaxis 11/11/2014  . Strawberry extract Anaphylaxis 08/19/2017  . Shellfish allergy Nausea And Vomiting  12/02/2017    Family History  Problem Relation Age of Onset  . Cancer Father   . Diabetes Sister   . Thrombosis Sister     Social History   Socioeconomic History  . Marital status: Significant Other    Spouse name: Not on file  . Number of children: Not on file  . Years of education: Not on file  . Highest education level: Not on file  Occupational History  . Not on file  Social Needs  . Financial resource strain: Not hard at all  . Food insecurity:    Worry: Never true    Inability: Never true  . Transportation needs:    Medical: No    Non-medical: No  Tobacco Use  . Smoking status: Current Every Day Smoker    Packs/day: 1.00    Years: 35.00    Pack years: 35.00    Types: Cigarettes  . Smokeless tobacco: Never Used  Substance and Sexual Activity  . Alcohol use: No    Alcohol/week: 0.0 standard drinks  . Drug use: No  . Sexual activity: Yes  Lifestyle  . Physical activity:    Days per week: 0 days    Minutes per session: 0 min  . Stress: Not at all  Relationships  . Social connections:    Talks on phone: More than three times a week    Gets together: More than three times a week    Attends religious service: More than 4 times per year    Active member of club or organization: No    Attends meetings of clubs or organizations: Never    Relationship status: Living with partner  . Intimate partner violence:    Fear of current or ex partner: No    Emotionally abused: No    Physically abused: No    Forced sexual activity: No  Other Topics Concern  . Not on file  Social History Narrative  . Not on file    Review of Systems: See HPI, otherwise negative ROS  Physical Exam: BP (!) 159/101   Pulse 81   Temp 97.9 F (36.6 C) (Temporal)   Resp 17   Ht 5\' 10"  (1.778 m)   Wt 99.8 kg   SpO2 97%   BMI 31.57 kg/m  General:   Alert,  pleasant and cooperative in NAD Head:  Normocephalic and atraumatic. Neck:  Supple; no masses or thyromegaly. Lungs:  Clear  throughout to auscultation.    Heart:  Regular rate and rhythm. Abdomen:  Soft, nontender and nondistended. Normal bowel sounds, without guarding, and without rebound.   Neurologic:  Alert and  oriented x4;  grossly normal neurologically.  Impression/Plan: Kristopher [name] is here for an colonoscopy to be performed for history of polyps 02/2016  Risks, benefits, limitations, and alternatives regarding  colonoscopy have been reviewed with  the patient.  Questions have been answered.  All parties agreeable.   Lucilla Lame, MD  01/19/2018, 7:36 AM

## 2018-01-19 NOTE — Anesthesia Procedure Notes (Signed)
Date/Time: 01/19/2018 7:46 AM Performed by: Janna Arch, CRNA Pre-anesthesia Checklist: Patient identified, Emergency Drugs available, Suction available, Timeout performed and Patient being monitored Patient Re-evaluated:Patient Re-evaluated prior to induction Oxygen Delivery Method: Nasal cannula Placement Confirmation: positive ETCO2

## 2018-01-20 ENCOUNTER — Ambulatory Visit: Payer: Medicare Other | Attending: Nurse Practitioner | Admitting: Nurse Practitioner

## 2018-01-20 ENCOUNTER — Other Ambulatory Visit: Payer: Self-pay

## 2018-01-20 ENCOUNTER — Encounter: Payer: Self-pay | Admitting: Dietician

## 2018-01-20 ENCOUNTER — Encounter: Payer: Self-pay | Admitting: Nurse Practitioner

## 2018-01-20 ENCOUNTER — Encounter: Payer: Medicare Other | Attending: Family Medicine | Admitting: Dietician

## 2018-01-20 VITALS — BP 162/100 | HR 71 | Temp 98.0°F | Resp 16 | Ht 70.0 in | Wt 210.0 lb

## 2018-01-20 VITALS — BP 152/104 | Ht 70.0 in | Wt 220.0 lb

## 2018-01-20 DIAGNOSIS — Z79891 Long term (current) use of opiate analgesic: Secondary | ICD-10-CM | POA: Insufficient documentation

## 2018-01-20 DIAGNOSIS — N181 Chronic kidney disease, stage 1: Secondary | ICD-10-CM | POA: Diagnosis not present

## 2018-01-20 DIAGNOSIS — Z5181 Encounter for therapeutic drug level monitoring: Secondary | ICD-10-CM | POA: Insufficient documentation

## 2018-01-20 DIAGNOSIS — I129 Hypertensive chronic kidney disease with stage 1 through stage 4 chronic kidney disease, or unspecified chronic kidney disease: Secondary | ICD-10-CM | POA: Insufficient documentation

## 2018-01-20 DIAGNOSIS — G894 Chronic pain syndrome: Secondary | ICD-10-CM | POA: Diagnosis not present

## 2018-01-20 DIAGNOSIS — Z7984 Long term (current) use of oral hypoglycemic drugs: Secondary | ICD-10-CM | POA: Diagnosis not present

## 2018-01-20 DIAGNOSIS — Z96642 Presence of left artificial hip joint: Secondary | ICD-10-CM | POA: Insufficient documentation

## 2018-01-20 DIAGNOSIS — M4802 Spinal stenosis, cervical region: Secondary | ICD-10-CM | POA: Diagnosis not present

## 2018-01-20 DIAGNOSIS — Z832 Family history of diseases of the blood and blood-forming organs and certain disorders involving the immune mechanism: Secondary | ICD-10-CM | POA: Diagnosis not present

## 2018-01-20 DIAGNOSIS — Z888 Allergy status to other drugs, medicaments and biological substances status: Secondary | ICD-10-CM | POA: Diagnosis not present

## 2018-01-20 DIAGNOSIS — M47816 Spondylosis without myelopathy or radiculopathy, lumbar region: Secondary | ICD-10-CM | POA: Diagnosis not present

## 2018-01-20 DIAGNOSIS — E1122 Type 2 diabetes mellitus with diabetic chronic kidney disease: Secondary | ICD-10-CM | POA: Insufficient documentation

## 2018-01-20 DIAGNOSIS — Z79899 Other long term (current) drug therapy: Secondary | ICD-10-CM | POA: Insufficient documentation

## 2018-01-20 DIAGNOSIS — M533 Sacrococcygeal disorders, not elsewhere classified: Secondary | ICD-10-CM | POA: Insufficient documentation

## 2018-01-20 DIAGNOSIS — M545 Low back pain: Secondary | ICD-10-CM | POA: Diagnosis present

## 2018-01-20 DIAGNOSIS — M25552 Pain in left hip: Secondary | ICD-10-CM | POA: Diagnosis not present

## 2018-01-20 DIAGNOSIS — F17210 Nicotine dependence, cigarettes, uncomplicated: Secondary | ICD-10-CM | POA: Diagnosis not present

## 2018-01-20 DIAGNOSIS — Z833 Family history of diabetes mellitus: Secondary | ICD-10-CM | POA: Diagnosis not present

## 2018-01-20 DIAGNOSIS — M7918 Myalgia, other site: Secondary | ICD-10-CM | POA: Insufficient documentation

## 2018-01-20 DIAGNOSIS — Z6831 Body mass index (BMI) 31.0-31.9, adult: Secondary | ICD-10-CM | POA: Diagnosis not present

## 2018-01-20 DIAGNOSIS — G8929 Other chronic pain: Secondary | ICD-10-CM

## 2018-01-20 DIAGNOSIS — M5416 Radiculopathy, lumbar region: Secondary | ICD-10-CM | POA: Diagnosis not present

## 2018-01-20 DIAGNOSIS — Z91013 Allergy to seafood: Secondary | ICD-10-CM | POA: Insufficient documentation

## 2018-01-20 DIAGNOSIS — Z713 Dietary counseling and surveillance: Secondary | ICD-10-CM | POA: Diagnosis not present

## 2018-01-20 DIAGNOSIS — Z86010 Personal history of colonic polyps: Secondary | ICD-10-CM | POA: Diagnosis not present

## 2018-01-20 DIAGNOSIS — Z880 Allergy status to penicillin: Secondary | ICD-10-CM | POA: Insufficient documentation

## 2018-01-20 DIAGNOSIS — K219 Gastro-esophageal reflux disease without esophagitis: Secondary | ICD-10-CM | POA: Diagnosis not present

## 2018-01-20 DIAGNOSIS — M4726 Other spondylosis with radiculopathy, lumbar region: Secondary | ICD-10-CM | POA: Diagnosis not present

## 2018-01-20 DIAGNOSIS — Z886 Allergy status to analgesic agent status: Secondary | ICD-10-CM | POA: Diagnosis not present

## 2018-01-20 DIAGNOSIS — E119 Type 2 diabetes mellitus without complications: Secondary | ICD-10-CM

## 2018-01-20 MED ORDER — NONFORMULARY OR COMPOUNDED ITEM: 2 refills | Status: DC

## 2018-01-20 MED ORDER — OXYCODONE HCL 10 MG PO TABS: 10.0000 mg | ORAL_TABLET | Freq: Four times a day (QID) | ORAL | 0 refills | Status: DC | PRN

## 2018-01-20 NOTE — Progress Notes (Signed)
Patient's Name: Kristopher [name]  MRN: [id number]  Referring Provider: [name], DO  DOB: 08/25/1960  PCP: [name], DO  DOS: 01/20/2018  Note by: Vevelyn Francois NP  Service setting: Ambulatory outpatient  Specialty: Interventional Pain Management  Location: [facility] (AMB) Pain Management Facility    Patient type: Established    Primary Reason(s) for Visit: Encounter for prescription drug management. (Level of risk: moderate)  CC: Back Pain (lower)  HPI  [name] is a 57 y.o. year old, male patient, who comes today for a medication management evaluation. He has Chronic low back pain (Location of Primary Source of Pain) (Bilateral) (L>R); Lumbar spondylosis; Chronic lumbar radicular pain (Location of Secondary source of pain) (S1 dermatomal) (Left); 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); Chronic neck pain (posterior midline) (Bilateral) (L>R); Cervical spondylosis; Chronic cervical radicular pain (Location of Tertiary source of pain) (Bilateral) (C5/C6 dermatome) (L>R); Long term current use of opiate analgesic; Long term prescription opiate use; Opiate use (60 MME/Day); Opioid dependence ([place]); Encounter for therapeutic drug level monitoring; Cervical spinal stenosis (C4-5); Cervical foraminal stenosis (Bilateral C5-6); Hypokalemia; Retrolisthesis of L5-S1; Cervical disc herniation (C4-5 and C5-6); Lumbar disc herniation (L5-S1); Chronic sacroiliac joint pain (Bilateral) (L>R); Allergy history, anesthetic (Unconfirmed allergy to Lidocaine); Chronic hip pain (Left); Lumbar facet syndrome (Location of Primary Source of Pain) (Bilateral) (L>R); Chronic lower extremity pain (Location of Secondary source of pain) (Left); Chronic upper extremity pain (Location of Tertiary source of pain) (Bilateral) (L>R); Greater occipital neuralgia (Right); Chronic pain syndrome; Benign hypertensive renal disease; Migraine; Special screening for malignant neoplasms, colon; Benign  neoplasm of ascending colon; Polyp of sigmoid colon; Rectal polyp; Tobacco abuse; GERD (gastroesophageal reflux disease); Musculoskeletal pain, chronic; Left-sided weakness; Aortic atherosclerosis ([place]); Hip arthritis; S/P hip replacement; Type 2 diabetes mellitus with stage 1 chronic kidney disease, without long-term current use of insulin ([place]); and History of colonic polyps on their problem list. His primarily concern today is the Back Pain (lower)  Pain Assessment: Location: Left Back Radiating: hip down the front of leg down to toes, right side outside to knee Onset: More than a month ago Duration: Chronic pain Quality: Aching, Numbness, Stabbing(feels like someone is cutting my leg off) Severity: 3 /10 (subjective, self-reported pain score)  Note: Reported level is compatible with observation.                          Effect on ADL: prolonged walking, standing, and sitting, hard to rest Timing: Constant Modifying factors: medication, Hip surgery 10/19 BP: (!) 162/100  HR: 71  [name] was last scheduled for an appointment on 10/21/2017 for medication management. During today's appointment we reviewed [name] chronic pain status, as well as his outpatient medication regimen.  He is status post left total hip replacement.  He admits that he is doing well.  He may have additional surgery to his left knee in the future.  The patient  reports that he does not use drugs. His body mass index is 30.13 kg/m.  Further details on both, my assessment(s), as well as the proposed treatment plan, please see below.  Controlled Substance Pharmacotherapy Assessment REMS (Risk Evaluation and Mitigation Strategy)  Analgesic:Oxycodone IR 10 mg every 6 hours (40 mg/day of oxycodone) MME/day:60 mg/day  Kristopher Specking, RN  01/20/2018  9:26 AM  Sign at close encounter Nursing Pain Medication Assessment:  Safety precautions to be maintained throughout the outpatient stay will include:  orient to  surroundings, keep bed in low position, maintain call bell within reach at all times, provide assistance with transfer out of bed and ambulation.  Medication Inspection Compliance: Pill count conducted under aseptic conditions, in front of the patient. Neither the pills nor the bottle was removed from the patient's sight at any time. Once count was completed pills were immediately returned to the patient in their original bottle.  Medication: Oxycodone IR Pill/Patch Count: 40 of 120 pills remain Pill/Patch Appearance: Markings consistent with prescribed medication Bottle Appearance: Standard pharmacy container. Clearly labeled. Filled Date: 10 / 29 / 2019 Last Medication intake:  Today   Pharmacokinetics: Liberation and absorption (onset of action): WNL Distribution (time to peak effect): WNL Metabolism and excretion (duration of action): WNL         Pharmacodynamics: Desired effects: Analgesia: Kristopher [name] reports >50% benefit. Functional ability: Patient reports that medication allows him to accomplish basic ADLs Clinically meaningful improvement in function (CMIF): Sustained CMIF goals met Perceived effectiveness: Described as relatively effective, allowing for increase in activities of daily living (ADL) Undesirable effects: Side-effects or Adverse reactions: None reported Monitoring:  PMP: Online review of the past 21-monthperiod conducted. Compliant with practice rules and regulations Last UDS on record: Summary  Date Value Ref Range Status  07/21/2017 FINAL  Final    Comment:    ==================================================================== TOXASSURE SELECT 13 (MW) ==================================================================== Test                             Result       Flag       Units Drug Present and Declared for Prescription Verification   Oxycodone                      2395         EXPECTED   ng/mg creat   Oxymorphone                    1083          EXPECTED   ng/mg creat   Noroxycodone                   1172         EXPECTED   ng/mg creat   Noroxymorphone                 160          EXPECTED   ng/mg creat    Sources of oxycodone are scheduled prescription medications.    Oxymorphone, noroxycodone, and noroxymorphone are expected    metabolites of oxycodone. Oxymorphone is also available as a    scheduled prescription medication. ==================================================================== Test                      Result    Flag   Units      Ref Range   Creatinine              213              mg/dL      >=20 ==================================================================== Declared Medications:  The flagging and interpretation on this report are based on the  following declared medications.  Unexpected results may arise from  inaccuracies in the declared medications.  **Note: The testing scope of this panel includes these medications:  Oxycodone  **Note: The testing scope of this panel does not  include following  reported medications:  Albuterol  Amlodipine  Baclofen  Doxazosin (Cardura)  Hydrochlorothiazide (Hydrodiuril)  Meloxicam (Mobic)  Metoprolol  Naloxone (Narcan)  Omeprazole (Prilosec)  Sumatriptan ==================================================================== For clinical consultation, please call [phone]. ====================================================================    UDS interpretation: Compliant          Medication Assessment Form: Reviewed. Patient indicates being compliant with therapy Treatment compliance: Compliant Risk Assessment Profile: Aberrant behavior: See prior evaluations. None observed or detected today Comorbid factors increasing risk of overdose: See prior notes. No additional risks detected today Opioid risk tool (ORT) (Total Score): 2 Personal History of Substance Abuse (SUD-Substance use disorder):  Alcohol: Negative  Illegal Drugs: Negative  Rx Drugs:  Negative  ORT Risk Level calculation: Low Risk Risk of substance use disorder (SUD): Low Opioid Risk Tool - 01/20/18 0924      Personal History of Substance Abuse   Alcohol  Negative    Illegal Drugs  Negative    Rx Drugs  Negative      Age   Age between 82-45 years   No      Psychological Disease   Psychological Disease  Positive    ADD  Positive    OCD  Negative    Bipolar  Negative    Schizophrenia  Negative    Depression  Negative      Total Score   Opioid Risk Tool Scoring  2    Opioid Risk Interpretation  Low Risk      ORT Scoring interpretation table:  Score <3 = Low Risk for SUD  Score between 4-7 = Moderate Risk for SUD  Score >8 = High Risk for Opioid Abuse   Risk Mitigation Strategies:  Patient Counseling: Covered Patient-Prescriber Agreement (PPA): Present and active  Notification to other healthcare providers: Done  Pharmacologic Plan: No change in therapy, at this time.             Laboratory Chemistry  Inflammation Markers (CRP: Acute Phase) (ESR: Chronic Phase) Lab Results  Component Value Date   CRP 0.7 04/03/2015   ESRSEDRATE 11 10/21/2017                         Rheumatology Markers No results found for: RF, ANA, LABURIC, URICUR, LYMEIGGIGMAB, LYMEABIGMQN, HLAB27                      Renal Function Markers Lab Results  Component Value Date   BUN 11 12/25/2017   CREATININE 0.93 12/25/2017   BCR 12 12/25/2017   GFRAA 106 12/25/2017   GFRNONAA 92 12/25/2017                             Hepatic Function Markers Lab Results  Component Value Date   AST 39 03/31/2017   ALT 57 (H) 03/31/2017   ALBUMIN 4.7 03/31/2017   ALKPHOS 113 03/31/2017                        Electrolytes Lab Results  Component Value Date   NA 136 12/25/2017   K 4.2 12/25/2017   CL 95 (L) 12/25/2017   CALCIUM 9.5 12/25/2017   MG 2.0 04/03/2015                        Neuropathy Markers Lab Results  Component Value Date   HGBA1C 6.5 (H)  08/14/2017   HIV  Non Reactive 08/14/2017                        CNS Tests No results found for: COLORCSF, APPEARCSF, RBCCOUNTCSF, WBCCSF, POLYSCSF, LYMPHSCSF, EOSCSF, PROTEINCSF, GLUCCSF, JCVIRUS, CSFOLI, IGGCSF                      Bone Pathology Markers No results found for: VD25OH, [id number], PO2423NT6, RW4315QM0, 25OHVITD1, 25OHVITD2, 25OHVITD3, TESTOFREE, TESTOSTERONE                       Coagulation Parameters Lab Results  Component Value Date   INR 1.06 10/21/2017   LABPROT 13.7 10/21/2017   APTT 25 10/21/2017   PLT 139 (L) 12/04/2017                        Cardiovascular Markers Lab Results  Component Value Date   HGB 11.8 (L) 12/04/2017   HCT 33.7 (L) 12/04/2017                         CA Markers No results found for: CEA, CA125, LABCA2                      Note: Lab results reviewed.  Recent Diagnostic Imaging Results  DG HIP UNILAT W OR W/O PELVIS 2-3 VIEWS LEFT CLINICAL DATA:  Post LEFT hip arthroplasty  EXAM: DG HIP (WITH OR WITHOUT PELVIS) 2-3V LEFT  COMPARISON:  Portable exam at 1226 hrs compared to 08/14/2017  FINDINGS: Interval placement of a LEFT hip prosthesis.  Faint linear lucency traverses the LEFT superior pubic ramus, potentially artifact though a nondisplaced fracture is not completely excluded.  No additional fracture, dislocation, or bone destruction.  Prior L5-S1 fusion.  IMPRESSION: New LEFT hip prosthesis without dislocation.  Faint linear lucency traverses the LEFT superior pubic ramus, potentially an artifact though a nondisplaced fracture is not completely excluded; follow-up dedicated nonportable pelvic radiograph recommended to evaluate.  Electronically Signed   By: Lavonia Dana M.D.   On: 12/02/2017 12:54 DG HIP OPERATIVE UNILAT W OR W/O PELVIS LEFT CLINICAL DATA:  57 year old male with left hip replacement.  EXAM: OPERATIVE 2 HIP (WITH PELVIS IF PERFORMED) left hip VIEWS  8.0 mGy.  TECHNIQUE: Fluoroscopic spot image(s) were  submitted for interpretation post-operatively.  COMPARISON:  08/14/2017.  FINDINGS: Post total left hip replacement. This appears in satisfactory position without complication noted on limited C-arm imaging.  IMPRESSION: Post total left hip replacement.  Electronically Signed   By: Genia Del M.D.   On: 12/02/2017 12:06  Complexity Note: Imaging results reviewed. Results shared with Mr. Cumbie, using Layman's terms.                         Meds   Current Outpatient Medications:  .  diphenhydrAMINE (BENADRYL) 25 MG tablet, Take 50 mg by mouth 2 (two) times daily. , Disp: , Rfl:  .  doxazosin (CARDURA) 4 MG tablet, Take 1 tablet (4 mg total) by mouth daily., Disp: 90 tablet, Rfl: 3 .  esomeprazole (NEXIUM) 40 MG capsule, Take 1 capsule (40 mg total) by mouth daily., Disp: 90 capsule, Rfl: 3 .  hydrALAZINE (APRESOLINE) 25 MG tablet, Take 1 tablet (25 mg total) by mouth daily., Disp: 90 tablet, Rfl: 1 .  metFORMIN (GLUCOPHAGE-XR) 500 MG 24  hr tablet, Take 2 tablets (1,000 mg total) by mouth 2 (two) times daily with a meal., Disp: 120 tablet, Rfl: 4 .  metoprolol succinate (TOPROL-XL) 100 MG 24 hr tablet, Take 1 tablet (100 mg total) by mouth at bedtime. Take with 25 mg to equal 125 mg once daily, Disp: 90 tablet, Rfl: 1 .  metoprolol succinate (TOPROL-XL) 25 MG 24 hr tablet, TAKE 1 TABLET (25 mg) BY MOUTH WITH A 100 mg TABLET FOR A TOTAL OF (125 mg) DAILY., Disp: 90 tablet, Rfl: 1 .  oxyCODONE-acetaminophen (PERCOCET) 10-325 MG tablet, Take 1 tablet by mouth every 4 (four) hours as needed for pain., Disp: , Rfl:  .  potassium chloride (KLOR-CON) 20 MEQ packet, Take 20 mEq by mouth daily., Disp: , Rfl:  .  SUMAtriptan (IMITREX) 100 MG tablet, TAKE ONE TABLET AT ONSET OF HEADACHE MAY REPEAT IN TWO HOURS IF NECESSARY LIMIT OF TWO IN 24 HOURS, Disp: 10 tablet, Rfl: 12 .  [START ON 01/28/2018] NONFORMULARY OR COMPOUNDED ITEM, 10% Ketamine/2% Cyclobenzaprine/6% Gabapentin Cream Sig: 1-2 ml  to affected area 3-4 times/day. Amount: 240 GM, Disp: 240 each, Rfl: 2 .  [START ON 03/29/2018] Oxycodone HCl 10 MG TABS, Take 1 tablet (10 mg total) by mouth every 6 (six) hours as needed., Disp: 120 tablet, Rfl: 0 .  [START ON 02/27/2018] Oxycodone HCl 10 MG TABS, Take 1 tablet (10 mg total) by mouth every 6 (six) hours as needed., Disp: 120 tablet, Rfl: 0 .  [START ON 01/28/2018] Oxycodone HCl 10 MG TABS, Take 1 tablet (10 mg total) by mouth every 6 (six) hours as needed., Disp: 120 tablet, Rfl: 0  ROS  Constitutional: Denies any fever or chills Gastrointestinal: No reported hemesis, hematochezia, vomiting, or acute GI distress Musculoskeletal: Denies any acute onset joint swelling, redness, loss of ROM, or weakness Neurological: No reported episodes of acute onset apraxia, aphasia, dysarthria, agnosia, amnesia, paralysis, loss of coordination, or loss of consciousness  Allergies  Mr. Friesen is allergic to aspirin; epinephrine; lidocaine; novocain [procaine]; penicillins; strawberry extract; and shellfish allergy.  [place]  Drug: Mr. Gonzaga  reports that he does not use drugs. Alcohol:  reports that he does not drink alcohol. Tobacco:  reports that he has been smoking cigarettes. He has a 35.00 pack-year smoking history. He has never used smokeless tobacco. Medical:  has a past medical history of Allergy, Arthritis, Benign hypertensive kidney disease, Chronic back pain, Diabetes mellitus, type 2 ([place]), Hypertension, and Migraines. Surgical: Mr. Winemiller  has a past surgical history that includes Back surgery; Appendectomy; Leg Surgery; Knee surgery (Left); Foot surgery (Left); Colonoscopy with propofol (N/A, 02/19/2016); polypectomy (N/A, 02/19/2016); Total hip arthroplasty (Left, 12/02/2017); Colonoscopy with propofol (N/A, 01/19/2018); polypectomy (01/19/2018); and DG OPERATIVE LEFT HIP ([place] HX). Family: family history includes Cancer in his father; Diabetes in his sister; Thrombosis in his  sister.  Constitutional Exam  General appearance: Well nourished, well developed, and well hydrated. In no apparent acute distress Vitals:   01/20/18 0914 01/20/18 0916  BP:  (!) 162/100  Pulse: 71   Resp: 16   Temp: 98 F (36.7 C)   SpO2: 100%   Weight: 210 lb (95.3 kg)   Height: 5' 10"  (1.778 m)   Psych/Mental status: Alert, oriented x 3 (person, place, & time)       Eyes: PERLA Respiratory: No evidence of acute respiratory distress  Lumbar Spine Area Exam  Skin & Axial Inspection: No masses, redness, or swelling Alignment: Symmetrical Functional ROM: Unrestricted ROM  Stability: No instability detected Muscle Tone/Strength: Functionally intact. No obvious neuro-muscular anomalies detected. Sensory (Neurological): Unimpaired Palpation: No palpable anomalies         Gait & Posture Assessment  Ambulation: Patient ambulates using a cane Gait: Relatively normal for age and body habitus Posture: WNL   Lower Extremity Exam    Side: Right lower extremity  Side: Left lower extremity  Stability: No instability observed          Stability: No instability observed          Skin & Extremity Inspection: Skin color, temperature, and hair growth are WNL. No peripheral edema or cyanosis. No masses, redness, swelling, asymmetry, or associated skin lesions. No contractures.  Skin & Extremity Inspection: Evidence of prior arthroplastic surgery  Functional ROM: Unrestricted ROM                  Functional ROM: Unrestricted ROM                  Muscle Tone/Strength: Functionally intact. No obvious neuro-muscular anomalies detected.  Muscle Tone/Strength: Functionally intact. No obvious neuro-muscular anomalies detected.   Assessment  Primary Diagnosis & Pertinent Problem List: The primary encounter diagnosis was Lumbar spondylosis. Diagnoses of Chronic lumbar radicular pain (Location of Secondary source of pain) (S1 dermatomal) (Left), Chronic hip pain (Left), Musculoskeletal pain,  chronic, Chronic pain syndrome, and Long term prescription opiate use were also pertinent to this visit.  Status Diagnosis  Controlled Controlled Controlled 1. Lumbar spondylosis   2. Chronic lumbar radicular pain (Location of Secondary source of pain) (S1 dermatomal) (Left)   3. Chronic hip pain (Left)   4. Musculoskeletal pain, chronic   5. Chronic pain syndrome   6. Long term prescription opiate use     Problems updated and reviewed during this visit: No problems updated. Plan of Care  Pharmacotherapy (Medications Ordered): Meds ordered this encounter  Medications  . NONFORMULARY OR COMPOUNDED ITEM    Sig: 10% Ketamine/2% Cyclobenzaprine/6% Gabapentin Cream Sig: 1-2 ml to affected area 3-4 times/day. Amount: 240 GM    Dispense:  240 each    Refill:  2    Order Specific Question:   Supervising Provider    AnswerMilinda Pointer [phone]  . Oxycodone HCl 10 MG TABS    Sig: Take 1 tablet (10 mg total) by mouth every 6 (six) hours as needed.    Dispense:  120 tablet    Refill:  0    Do not place this medication, or any other prescription from our practice, on "Automatic Refill". Patient may have prescription filled one day early if pharmacy is closed on scheduled refill date.    Order Specific Question:   Supervising Provider    Answer:   [name] [phone]  . Oxycodone HCl 10 MG TABS    Sig: Take 1 tablet (10 mg total) by mouth every 6 (six) hours as needed.    Dispense:  120 tablet    Refill:  0    Do not place this medication, or any other prescription from our practice, on "Automatic Refill". Patient may have prescription filled one day early if pharmacy is closed on scheduled refill date.    Order Specific Question:   Supervising Provider    Answer:   [name] [phone]  . Oxycodone HCl 10 MG TABS    Sig: Take 1 tablet (10 mg total) by mouth every 6 (six) hours as needed.    Dispense:  120 tablet  Refill:  0    Do not place this medication, or  any other prescription from our practice, on "Automatic Refill". Patient may have prescription filled one day early if pharmacy is closed on scheduled refill date.    Order Specific Question:   Supervising Provider    Answer:   [name] [[id number]]   New Prescriptions   No medications on file   Medications administered today: Maddax A. Detlefsen had no medications administered during this visit. Lab-work, procedure(s), and/or referral(s): Orders Placed This Encounter  Procedures  . ToxASSURE Select 13 (MW), Urine   Imaging and/or referral(s): None  Interventional therapies: Planned, scheduled, and/or pending: Not at this time.    Considering: None at this time   Palliative PRN treatment(s): None at this time    Provider-requested follow-up: Return in about 3 months (around 04/22/2018) for MedMgmt.  Future Appointments  Date Time Provider [place]  01/20/2018  1:15 PM Ronney Asters, RN [facility] None  02/02/2018  9:30 AM [name], DO [name]  04/22/2018  9:00 AM Vevelyn Francois, NP [facility] None  06/26/2018  8:30 AM [name], DO [name]  08/12/2018  8:00 AM [facility]FP NURSE HEALTH ADVISOR [name]   Primary Care Physician: [name], DO Location: [facility] Note by: Vevelyn Francois NP Date: 01/20/2018; Time: 10:42 AM  Pain Score Disclaimer: We use the NRS-11 scale. This is a self-reported, subjective measurement of pain severity with only modest accuracy. It is used primarily to identify changes within a particular patient. It must be understood that outpatient pain scales are significantly less accurate that those used for research, where they can be applied under ideal controlled circumstances with minimal exposure to variables. In reality, the score is likely to be a combination of pain intensity and pain affect, where pain affect describes the degree of emotional arousal or changes in action  readiness caused by the sensory experience of pain. Factors such as social and work situation, setting, emotional state, anxiety levels, expectation, and prior pain experience may influence pain perception and show large inter-individual differences that may also be affected by time variables.  Patient instructions provided during this appointment: Patient Instructions  ____________________________________________________________________________________________  Medication Rules  Applies to: All patients receiving prescriptions (written or electronic).  Pharmacy of record: Pharmacy where electronic prescriptions will be sent. If written prescriptions are taken to a different pharmacy, please inform the nursing staff. The pharmacy listed in the electronic medical record should be the one where you would like electronic prescriptions to be sent.  Prescription refills: Only during scheduled appointments. Applies to both, written and electronic prescriptions.  NOTE: The following applies primarily to controlled substances (Opioid* Pain Medications).   Patient's responsibilities: 1. Pain Pills: Bring all pain pills to every appointment (except for procedure appointments). 2. Pill Bottles: Bring pills in original pharmacy bottle. Always bring newest bottle. Bring bottle, even if empty. 3. Medication refills: You are responsible for knowing and keeping track of what medications you need refilled. The day before your appointment, write a list of all prescriptions that need to be refilled. Bring that list to your appointment and give it to the admitting nurse. Prescriptions will be written only during appointments. If you forget a medication, it will not be "Called in", "Faxed", or "electronically sent". You will need to get another appointment to get these prescribed. 4. Prescription Accuracy: You are responsible for carefully inspecting your prescriptions before leaving our office. Have the  discharge  nurse carefully go over each prescription with you, before taking them home. Make sure that your name is accurately spelled, that your address is correct. Check the name and dose of your medication to make sure it is accurate. Check the number of pills, and the written instructions to make sure they are clear and accurate. Make sure that you are given enough medication to last until your next medication refill appointment. 5. Taking Medication: Take medication as prescribed. Never take more pills than instructed. Never take medication more frequently than prescribed. Taking less pills or less frequently is permitted and encouraged, when it comes to controlled substances (written prescriptions).  6. Inform other Doctors: Always inform, all of your healthcare providers, of all the medications you take. 7. Pain Medication from other Providers: You are not allowed to accept any additional pain medication from any other Doctor or Healthcare provider. There are two exceptions to this rule. (see below) In the event that you require additional pain medication, you are responsible for notifying us, as stated below. 8. Medication Agreement: You are responsible for carefully reading and following our Medication Agreement. This must be signed before receiving any prescriptions from our practice. Safely store a copy of your signed Agreement. Violations to the Agreement will result in no further prescriptions. (Additional copies of our Medication Agreement are available upon request.) 9. Laws, Rules, & Regulations: All patients are expected to follow all Federal and [employer], [employer], Rules, [employer] [facility]. Ignorance of the Laws does not constitute a valid excuse. The use of any illegal substances is prohibited. 10. Adopted CDC guidelines & recommendations: Target dosing levels will be at or below 60 MME/day. Use of benzodiazepines** is not recommended.  Exceptions: There are only two exceptions to the rule of not  receiving pain medications from other Healthcare Providers. 1. Exception #1 (Emergencies): In the event of an emergency (i.e.: accident requiring emergency care), you are allowed to receive additional pain medication. However, you are responsible for: As soon as you are able, call our office [phone], at any time of the day or night, and leave a message stating your name, the date and nature of the emergency, and the name and dose of the medication prescribed. In the event that your call is answered by a member of our staff, make sure to document and save the date, time, and the name of the person that took your information.  2. Exception #2 (Planned Surgery): In the event that you are scheduled by another doctor or dentist to have any type of surgery or procedure, you are allowed (for a period no longer than 30 days), to receive additional pain medication, for the acute post-op pain. However, in this case, you are responsible for picking up a copy of our "Post-op Pain Management for Surgeons" handout, and giving it to your surgeon or dentist. This document is available at our office, and does not require an appointment to obtain it. Simply go to our office during business hours (Monday-Thursday from 8:00 AM to 4:00 PM) (Friday 8:00 AM to 12:00 Noon) or if you have a scheduled appointment with [place], prior to your surgery, and ask for it by name. In addition, you will need to provide [place] with your name, name of your surgeon, type of surgery, and date of procedure or surgery.  *Opioid medications include: morphine, codeine, oxycodone, oxymorphone, hydrocodone, hydromorphone, meperidine, tramadol, tapentadol, buprenorphine, fentanyl, methadone. **Benzodiazepine medications include: diazepam (Valium), alprazolam (Xanax), clonazepam (Klonopine), lorazepam (Ativan), clorazepate (Tranxene),  chlordiazepoxide (Librium), estazolam (Prosom), oxazepam (Serax), temazepam (Restoril), triazolam (Halcion) (Last updated:  05/01/2017) ____________________________________________________________________________________________

## 2018-01-20 NOTE — Patient Instructions (Signed)
____________________________________________________________________________________________  Medication Rules  Applies to: All patients receiving prescriptions (written or electronic).  Pharmacy of record: Pharmacy where electronic prescriptions will be sent. If written prescriptions are taken to a different pharmacy, please inform the nursing staff. The pharmacy listed in the electronic medical record should be the one where you would like electronic prescriptions to be sent.  Prescription refills: Only during scheduled appointments. Applies to both, written and electronic prescriptions.  NOTE: The following applies primarily to controlled substances (Opioid* Pain Medications).   Patient's responsibilities: 1. Pain Pills: Bring all pain pills to every appointment (except for procedure appointments). 2. Pill Bottles: Bring pills in original pharmacy bottle. Always bring newest bottle. Bring bottle, even if empty. 3. Medication refills: You are responsible for knowing and keeping track of what medications you need refilled. The day before your appointment, write a list of all prescriptions that need to be refilled. Bring that list to your appointment and give it to the admitting nurse. Prescriptions will be written only during appointments. If you forget a medication, it will not be "Called in", "Faxed", or "electronically sent". You will need to get another appointment to get these prescribed. 4. Prescription Accuracy: You are responsible for carefully inspecting your prescriptions before leaving our office. Have the discharge nurse carefully go over each prescription with you, before taking them home. Make sure that your name is accurately spelled, that your address is correct. Check the name and dose of your medication to make sure it is accurate. Check the number of pills, and the written instructions to make sure they are clear and accurate. Make sure that you are given enough medication to last  until your next medication refill appointment. 5. Taking Medication: Take medication as prescribed. Never take more pills than instructed. Never take medication more frequently than prescribed. Taking less pills or less frequently is permitted and encouraged, when it comes to controlled substances (written prescriptions).  6. Inform other Doctors: Always inform, all of your healthcare providers, of all the medications you take. 7. Pain Medication from other Providers: You are not allowed to accept any additional pain medication from any other Doctor or Healthcare provider. There are two exceptions to this rule. (see below) In the event that you require additional pain medication, you are responsible for notifying us, as stated below. 8. Medication Agreement: You are responsible for carefully reading and following our Medication Agreement. This must be signed before receiving any prescriptions from our practice. Safely store a copy of your signed Agreement. Violations to the Agreement will result in no further prescriptions. (Additional copies of our Medication Agreement are available upon request.) 9. Laws, Rules, & Regulations: All patients are expected to follow all Federal and State Laws, Statutes, Rules, & Regulations. Ignorance of the Laws does not constitute a valid excuse. The use of any illegal substances is prohibited. 10. Adopted CDC guidelines & recommendations: Target dosing levels will be at or below 60 MME/day. Use of benzodiazepines** is not recommended.  Exceptions: There are only two exceptions to the rule of not receiving pain medications from other Healthcare Providers. 1. Exception #1 (Emergencies): In the event of an emergency (i.e.: accident requiring emergency care), you are allowed to receive additional pain medication. However, you are responsible for: As soon as you are able, call our office [phone], at any time of the day or night, and leave a message stating your name, the  date and nature of the emergency, and the name and dose of the medication   prescribed. In the event that your call is answered by a member of our staff, make sure to document and save the date, time, and the name of the person that took your information.  2. Exception #2 (Planned Surgery): In the event that you are scheduled by another doctor or dentist to have any type of surgery or procedure, you are allowed (for a period no longer than 30 days), to receive additional pain medication, for the acute post-op pain. However, in this case, you are responsible for picking up a copy of our "Post-op Pain Management for Surgeons" handout, and giving it to your surgeon or dentist. This document is available at our office, and does not require an appointment to obtain it. Simply go to our office during business hours (Monday-Thursday from 8:00 AM to 4:00 PM) (Friday 8:00 AM to 12:00 Noon) or if you have a scheduled appointment with us, prior to your surgery, and ask for it by name. In addition, you will need to provide us with your name, name of your surgeon, type of surgery, and date of procedure or surgery.  *Opioid medications include: morphine, codeine, oxycodone, oxymorphone, hydrocodone, hydromorphone, meperidine, tramadol, tapentadol, buprenorphine, fentanyl, methadone. **Benzodiazepine medications include: diazepam (Valium), alprazolam (Xanax), clonazepam (Klonopine), lorazepam (Ativan), clorazepate (Tranxene), chlordiazepoxide (Librium), estazolam (Prosom), oxazepam (Serax), temazepam (Restoril), triazolam (Halcion) (Last updated: 05/01/2017) ____________________________________________________________________________________________    

## 2018-01-20 NOTE — Progress Notes (Signed)
Diabetes Self-Management Education  Visit Type: First/Initial  Appt. Start Time: 1325 Appt. End Time: 3354  01/20/2018  [name], identified by name and date of birth, is a 57 y.o. male with a diagnosis of Diabetes: Type 2.   ASSESSMENT  Blood pressure (!) 152/104 Rechecked 156/102;  height 5\' 10"  (1.778 m), weight 220 lb (99.8 kg). Body mass index is 31.57 kg/m.  Diabetes Self-Management Education - 01/20/18 1507      Visit Information   Visit Type  First/Initial      Initial Visit   Diabetes Type  Type 2      Health Coping   How would you rate your overall health?  Fair      Psychosocial Assessment   Patient Belief/Attitude about Diabetes  Motivated to manage diabetes    Self-care barriers  None    Self-management support  Doctor's office;Friends    Other persons present  Patient    Patient Concerns  Glycemic Control;Weight Control   prevent  complications; quit smoking   Special Needs  None    Preferred Learning Style  Hands on    Learning Readiness  Ready    What is the last grade level you completed in school?  11      Pre-Education Assessment   Patient understands the diabetes disease and treatment process.  Needs Instruction    Patient understands incorporating nutritional management into lifestyle.  Needs Instruction    Patient undertands incorporating physical activity into lifestyle.  Needs Instruction    Patient understands using medications safely.  Needs Review    Patient understands monitoring blood glucose, interpreting and using results  Needs Review   has an accuchek meter   Patient understands prevention, detection, and treatment of acute complications.  Needs Instruction    Patient understands prevention, detection, and treatment of chronic complications.  Needs Instruction    Patient understands how to develop strategies to address psychosocial issues.  Needs Instruction    Patient understands how to develop strategies to promote  health/change behavior.  Needs Instruction      Complications   Last HgB A1C per patient/outside source  10 %   12-30-17   How often do you check your blood sugar?  0 times/day (not testing)   got accuchek meter 2 wks ago and has only checked BG 3x; has not checked past 4-5 days   Fasting Blood glucose range (mg/dL)  130-179   FBG x1=160's; ac supper 200's   Postprandial Blood glucose range (mg/dL)  >200   bedtime BG x1-250's   Have you had a dilated eye exam in the past 12 months?  No   never   Have you had a dental exam in the past 12 months?  Yes   6 months ago   Are you checking your feet?  Yes    How many days per week are you checking your feet?  7      Dietary Intake   Breakfast  eats breakfast at 6a=eats 4 eggs, 10 slices bacon and 4 slices toast     Snack (morning)  eats chips, snack foods, sweets, ice cream, pistachios (large amounts)    Lunch  usually eats meat/cheese sandwich + chips     Snack (afternoon)  eats chips, snack foods, sweets, ice cream, pistachios (large amounts)    Dinner  eats meat + vegetables (eats large amounts)   Snack (evening)  eats chips, snack foods, sweets, ice cream, pistachios (large amounts)  Beverage(s)  drinks fruit juice 2-3x/day, 1/2 gallon milk/day, regular soda and sweet tea, hot chocolate with marshmellows 8+x/day      Exercise   Exercise Type  Light (walking / raking leaves)   had left hip replacement 12-02-17 and has 1 more day of PT this wk; walks 30 min. 2x/day 5 days/wk-uses a cane   How many days per week to you exercise?  5    How many minutes per day do you exercise?  60    Total minutes per week of exercise  300      Patient Education   Previous Diabetes Education  No    Disease state   Factors that contribute to the development of diabetes;Definition of diabetes, type 1 and 2, and the diagnosis of diabetes    Nutrition management   Role of diet in the treatment of diabetes and the relationship between the three main  macronutrients and blood glucose level;Food label reading, portion sizes and measuring food.;Carbohydrate counting    Physical activity and exercise   Role of exercise on diabetes management, blood pressure control and cardiac health.;Helped patient identify appropriate exercises in relation to his/her diabetes, diabetes complications and other health issue.    Medications  Reviewed patients medication for diabetes, action, purpose, timing of dose and side effects.    Monitoring  Purpose and frequency of SMBG.;Yearly dilated eye exam;Taught/discussed recording of test results and interpretation of SMBG.;Identified appropriate SMBG and/or A1C goals.    Acute complications  Discussed and identified patients' treatment of hyperglycemia.    Chronic complications  Relationship between chronic complications and blood glucose control;Dental care;Retinopathy and reason for yearly dilated eye exams;Reviewed with patient heart disease, higher risk of, and prevention    Personal strategies to promote health  Lifestyle issues that need to be addressed for better diabetes care;Helped patient develop diabetes management plan for (enter comment);Review risk of smoking and offered smoking cessation      Outcomes   Expected Outcomes  Demonstrated interest in learning. Expect positive outcomes       Individualized Plan for Diabetes Self-Management Training:   Learning Objective:  Patient will have a greater understanding of diabetes self-management. Patient education plan is to attend individual and/or group sessions per assessed needs and concerns.   Plan:   Patient Instructions   Check blood sugars 2 x day before breakfast and 2 hrs after supper every day and record  Bring blood sugar records to the next appointment/class  Exercise:  Continue walking 30 min. twice a day 5 days/wk-but ONLY if sugar is below 250  Eat 3 meals day and 2-3 snacks a day  Space meals 4-5 hours apart  Avoid sugar sweetened  drinks (soda, tea, coffee, sports drinks, juices)  Drink plenty of water-try Diet Welch's and Healthy Balance drinks & diet beverages  Eat 3-4 carbohydrate servings/meal + protein  Eat 1 carbohydrate serving/snack + protein  Quit / Decrease smoking-consider smoking cessation classes  Make dentist  appointment and once sugars are in better control make an eye doctor appointment  Recheck BP and call MD if remains  elevated  Call MD if sugars consistently stay over 300  Return for appointment/classes on:  02-05-18    Expected Outcomes:  Demonstrated interest in learning. Expect positive outcomes  Education material provided: General meal planning guidelines, Food Group handout, Smoking cessation classes handout  If problems or questions, patient to contact team via: [phone]  Future DSME appointment:  02-05-18

## 2018-01-20 NOTE — Progress Notes (Signed)
Nursing Pain Medication Assessment:  Safety precautions to be maintained throughout the outpatient stay will include: orient to surroundings, keep bed in low position, maintain call bell within reach at all times, provide assistance with transfer out of bed and ambulation.  Medication Inspection Compliance: Pill count conducted under aseptic conditions, in front of the patient. Neither the pills nor the bottle was removed from the patient's sight at any time. Once count was completed pills were immediately returned to the patient in their original bottle.  Medication: Oxycodone IR Pill/Patch Count: 40 of 120 pills remain Pill/Patch Appearance: Markings consistent with prescribed medication Bottle Appearance: Standard pharmacy container. Clearly labeled. Filled Date: 10 / 29 / 2019 Last Medication intake:  Today

## 2018-01-20 NOTE — Patient Instructions (Addendum)
  Check blood sugars 2 x day before breakfast and 2 hrs after supper every day and record  Bring blood sugar records to the next appointment/class  Exercise:  Continue walking 30 min. twice a day 5 days/wk-but ONLY if sugar is below 250  Eat 3 meals day and 2-3 snacks a day  Space meals 4-5 hours apart  Avoid sugar sweetened drinks (soda, tea, coffee, sports drinks, juices)  Drink plenty of water-try Diet Welch's and Healthy Balance drinks & diet beverages   Eat 3-4 carbohydrate servings/meal + protein  Eat 1 carbohydrate serving/snack + protein  Quit / Decrease smoking-consider smoking cessation classes  Make dentist  appointment and once sugars are in better control make an eye doctor appointment  Recheck BP and call MD if remains  elevated  Call MD if sugars consistently stay over 300  Return for appointment/classes on:  02-05-18

## 2018-01-21 ENCOUNTER — Ambulatory Visit: Payer: Self-pay | Admitting: *Deleted

## 2018-01-21 ENCOUNTER — Ambulatory Visit: Payer: Self-pay | Admitting: Internal Medicine

## 2018-01-21 NOTE — Telephone Encounter (Signed)
Pt calling stating that blood glucose today was 490 while on the phone with triage nurse. Pt states that blood glucose at 6 am was 294 and at 11 am today was 418. Pt states he took his dose of Metformin today at 6 am and then again at 12 pm. Pt states I feel "perfect" and denies having any shortness of breath, chest pain or dizziness.Pt states she has experienced frequent urination and stools. Contacted Karen at the office who will forward triage note to PCP. Advised pt that if he begins to feel worse or if blood glucose increases during the night to seek care in the ED. Pt verbalized understanding.  Reason for Disposition . Blood glucose > 400 mg/dL (22.2 mmol/L)  Answer Assessment - Initial Assessment Questions 1. BLOOD GLUCOSE: "What is your blood glucose level?"      490 2. ONSET: "When did you check the blood glucose?"     While on the phone with triage nurse 3. USUAL RANGE: "What is your glucose level usually?" (e.g., usual fasting morning value, usual evening value)     Newly diagnosed diabetic 4. KETONES: "Do you check for ketones (urine or blood test strips)?" If yes, ask: "What does the test show now?"      n/a 5. TYPE 1 or 2:  "Do you know what type of diabetes you have?"  (e.g., Type 1, Type 2, Gestational; doesn't know)      Type 2 6. INSULIN: "Do you take insulin?" "What type of insulin(s) do you use? What is the mode of delivery? (syringe, pen (e.g., injection or  pump)?"      No does not take insulin 7. DIABETES PILLS: "Do you take any pills for your diabetes?" If yes, ask: "Have you missed taking any pills recently?"     Is Metformin on Metformin and has not missed any doses 8. OTHER SYMPTOMS: "Do you have any symptoms?" (e.g., fever, frequent urination, difficulty breathing, dizziness, weakness, vomiting)     Frequent  Urination and stools 9. PREGNANCY: "Is there any chance you are pregnant?" "When was your last menstrual period?"     n/a  Protocols used: DIABETES - HIGH  BLOOD SUGAR-A-AH

## 2018-01-22 NOTE — Telephone Encounter (Signed)
Message relayed to patient. Verbalized understanding and denied questions.   

## 2018-01-22 NOTE — Telephone Encounter (Signed)
Have him watch his diet and continue his medicine. I'm seeing him on 12/2

## 2018-01-23 DIAGNOSIS — G43909 Migraine, unspecified, not intractable, without status migrainosus: Secondary | ICD-10-CM | POA: Diagnosis not present

## 2018-01-23 DIAGNOSIS — G8194 Hemiplegia, unspecified affecting left nondominant side: Secondary | ICD-10-CM | POA: Diagnosis not present

## 2018-01-23 DIAGNOSIS — M4802 Spinal stenosis, cervical region: Secondary | ICD-10-CM | POA: Diagnosis not present

## 2018-01-23 DIAGNOSIS — M48061 Spinal stenosis, lumbar region without neurogenic claudication: Secondary | ICD-10-CM | POA: Diagnosis not present

## 2018-01-23 DIAGNOSIS — M461 Sacroiliitis, not elsewhere classified: Secondary | ICD-10-CM | POA: Diagnosis not present

## 2018-01-23 DIAGNOSIS — Z96642 Presence of left artificial hip joint: Secondary | ICD-10-CM | POA: Diagnosis not present

## 2018-01-23 DIAGNOSIS — I700 Atherosclerosis of aorta: Secondary | ICD-10-CM | POA: Diagnosis not present

## 2018-01-23 DIAGNOSIS — M5020 Other cervical disc displacement, unspecified cervical region: Secondary | ICD-10-CM | POA: Diagnosis not present

## 2018-01-23 DIAGNOSIS — Z471 Aftercare following joint replacement surgery: Secondary | ICD-10-CM | POA: Diagnosis not present

## 2018-01-23 DIAGNOSIS — M961 Postlaminectomy syndrome, not elsewhere classified: Secondary | ICD-10-CM | POA: Diagnosis not present

## 2018-01-23 DIAGNOSIS — I10 Essential (primary) hypertension: Secondary | ICD-10-CM | POA: Diagnosis not present

## 2018-01-23 DIAGNOSIS — G894 Chronic pain syndrome: Secondary | ICD-10-CM | POA: Diagnosis not present

## 2018-01-23 DIAGNOSIS — Z9181 History of falling: Secondary | ICD-10-CM | POA: Diagnosis not present

## 2018-01-23 DIAGNOSIS — M47816 Spondylosis without myelopathy or radiculopathy, lumbar region: Secondary | ICD-10-CM | POA: Diagnosis not present

## 2018-01-23 DIAGNOSIS — M19072 Primary osteoarthritis, left ankle and foot: Secondary | ICD-10-CM | POA: Diagnosis not present

## 2018-01-23 DIAGNOSIS — Z85038 Personal history of other malignant neoplasm of large intestine: Secondary | ICD-10-CM | POA: Diagnosis not present

## 2018-01-23 DIAGNOSIS — M5126 Other intervertebral disc displacement, lumbar region: Secondary | ICD-10-CM | POA: Diagnosis not present

## 2018-01-23 LAB — TOXASSURE SELECT 13 (MW), URINE

## 2018-02-02 ENCOUNTER — Other Ambulatory Visit: Payer: Self-pay | Admitting: Family Medicine

## 2018-02-02 ENCOUNTER — Encounter: Payer: Self-pay | Admitting: Family Medicine

## 2018-02-02 ENCOUNTER — Ambulatory Visit (INDEPENDENT_AMBULATORY_CARE_PROVIDER_SITE_OTHER): Payer: Medicare Other | Admitting: Family Medicine

## 2018-02-02 VITALS — BP 148/104 | HR 71 | Temp 98.0°F | Wt 222.1 lb

## 2018-02-02 DIAGNOSIS — N181 Chronic kidney disease, stage 1: Secondary | ICD-10-CM | POA: Diagnosis not present

## 2018-02-02 DIAGNOSIS — E1122 Type 2 diabetes mellitus with diabetic chronic kidney disease: Secondary | ICD-10-CM | POA: Diagnosis not present

## 2018-02-02 DIAGNOSIS — I10 Essential (primary) hypertension: Secondary | ICD-10-CM

## 2018-02-02 MED ORDER — HYDRALAZINE HCL 25 MG PO TABS: 25.0000 mg | ORAL_TABLET | Freq: Two times a day (BID) | ORAL | 1 refills | Status: DC

## 2018-02-02 NOTE — Assessment & Plan Note (Signed)
Tolerating the metformin well. Only taking 500mg  BID right now. Will increase to 1000mg  BID and recheck 2 months with A1c. Call with any concerns. Checking BMP today.

## 2018-02-02 NOTE — Progress Notes (Signed)
BP (!) 148/104 (BP Location: Left Arm, Cuff Size: Large)   Pulse 71   Temp 98 F (36.7 C) (Oral)   Wt 222 lb 1.6 oz (100.7 kg)   SpO2 96%   BMI 31.87 kg/m    Subjective:    Patient ID: Kristopher [name], male    DOB: 1960/06/20, 57 y.o.   MRN: [id number]  HPI: Kristopher [name] is a 57 y.o. male  Chief Complaint  Patient presents with  . Diabetes    4 week f/up   DIABETES- newly diagnosed last visit with an A1c of 10.0- started on metformin. Here today to check on tolerance.  Hypoglycemic episodes:no Polydipsia/polyuria: yes Visual disturbance: yes- better Chest pain: no Paresthesias: no Glucose Monitoring: yes  Accucheck frequency: TID Taking Insulin?: no Blood Pressure Monitoring: not checking Retinal Examination: Not up to Date Foot Exam: Up to Date Diabetic Education: Not Completed Pneumovax: Up to Date Influenza: Not up to Date Aspirin: no  Relevant past medical, surgical, family and social history reviewed and updated as indicated. Interim medical history since our last visit reviewed. Allergies and medications reviewed and updated.  Review of Systems  Constitutional: Negative.   Respiratory: Negative.   Cardiovascular: Negative.   Neurological: Negative.   Psychiatric/Behavioral: Negative.     Per HPI unless specifically indicated above     Objective:    BP (!) 148/104 (BP Location: Left Arm, Cuff Size: Large)   Pulse 71   Temp 98 F (36.7 C) (Oral)   Wt 222 lb 1.6 oz (100.7 kg)   SpO2 96%   BMI 31.87 kg/m   Wt Readings from Last 3 Encounters:  02/02/18 222 lb 1.6 oz (100.7 kg)  01/20/18 220 lb (99.8 kg)  01/20/18 210 lb (95.3 kg)    Physical Exam  Constitutional: He is oriented to person, place, and time. He appears well-developed and well-nourished. No distress.  HENT:  Head: Normocephalic and atraumatic.  Right Ear: Hearing normal.  Left Ear: Hearing normal.  Nose: Nose normal.  Eyes: Conjunctivae and lids are normal. Right eye  exhibits no discharge. Left eye exhibits no discharge. No scleral icterus.  Cardiovascular: Normal rate, regular rhythm, normal heart sounds and intact distal pulses. Exam reveals no gallop and no friction rub.  No murmur heard. Pulmonary/Chest: Effort normal and breath sounds normal. No stridor. No respiratory distress. He has no wheezes. He has no rales. He exhibits no tenderness.  Musculoskeletal: Normal range of motion.  Neurological: He is alert and oriented to person, place, and time.  Skin: Skin is warm, dry and intact. Capillary refill takes less than 2 seconds. No rash noted. He is not diaphoretic. No erythema. No pallor.  Psychiatric: He has a normal mood and affect. His speech is normal and behavior is normal. Judgment and thought content normal. Cognition and memory are normal.    Results for orders placed or performed in visit on 01/20/18  ToxASSURE Select 13 (MW), Urine  Result Value Ref Range   Summary FINAL       Assessment & Plan:   Problem List Items Addressed This Visit      Endocrine   Type 2 diabetes mellitus with stage 1 chronic kidney disease, without long-term current use of insulin ([place]) - Primary    Tolerating the metformin well. Only taking 500mg  BID right now. Will increase to 1000mg  BID and recheck 2 months with A1c. Call with any concerns. Checking BMP today.      Relevant Orders  Basic metabolic panel       Follow up plan: Return in about 2 months (around 04/05/2018) for Follow up DM.

## 2018-02-03 ENCOUNTER — Encounter: Payer: Self-pay | Admitting: Family Medicine

## 2018-02-03 LAB — BASIC METABOLIC PANEL
BUN/Creatinine Ratio: 8 — ABNORMAL LOW (ref 9–20)
BUN: 8 mg/dL (ref 6–24)
CO2: 23 mmol/L (ref 20–29)
Calcium: 10.1 mg/dL (ref 8.7–10.2)
Chloride: 97 mmol/L (ref 96–106)
Creatinine, Ser: 0.96 mg/dL (ref 0.76–1.27)
GFR calc Af Amer: 102 mL/min/{1.73_m2} (ref >59)
GFR calc non Af Amer: 88 mL/min/{1.73_m2} (ref >59)
Glucose: 214 mg/dL — ABNORMAL HIGH (ref 65–99)
Potassium: 3.8 mmol/L (ref 3.5–5.2)
Sodium: 137 mmol/L (ref 134–144)

## 2018-02-03 NOTE — Telephone Encounter (Signed)
Requested Prescriptions  Pending Prescriptions Disp Refills  . hydrALAZINE (APRESOLINE) 25 MG tablet [Pharmacy Med Name: HYDRALAZINE 25 MG TABLET] 270 tablet 1    Sig: TAKE 1 TABLET BY MOUTH THREE TIMES A DAY     Cardiovascular:  Vasodilators Failed - 02/02/2018  1:44 AM      Failed - HCT in normal range and within 360 days    HCT  Date Value Ref Range Status  12/04/2017 33.7 (L) 40.0 - 52.0 % Final   Hematocrit  Date Value Ref Range Status  03/31/2017 45.1 37.5 - 51.0 % Final         Failed - HGB in normal range and within 360 days    Hemoglobin  Date Value Ref Range Status  12/04/2017 11.8 (L) 13.0 - 18.0 g/dL Final  03/31/2017 14.9 13.0 - 17.7 g/dL Final         Failed - RBC in normal range and within 360 days    RBC  Date Value Ref Range Status  12/04/2017 3.73 (L) 4.40 - 5.90 MIL/uL Final         Failed - WBC in normal range and within 360 days    WBC  Date Value Ref Range Status  12/04/2017 10.7 (H) 3.8 - 10.6 K/uL Final         Failed - PLT in normal range and within 360 days    Platelets  Date Value Ref Range Status  12/04/2017 139 (L) 150 - 440 K/uL Final    Comment:    Performed at [facility], [address] [place], [place] [address]  03/31/2017 194 150 - 379 x10E3/uL Final         Failed - Last BP in normal range    BP Readings from Last 1 Encounters:  02/02/18 (!) 148/104         Passed - Valid encounter within last 12 months    Recent Outpatient Visits          Yesterday Type 2 diabetes mellitus with stage 1 chronic kidney disease, without long-term current use of insulin ([place])   [facility] [place], [name], DO   1 month ago Type 2 diabetes mellitus with stage 1 chronic kidney disease, without long-term current use of insulin ([place])   [place], [place], DO   1 month ago Benign hypertensive renal disease   [facility] [place], [place], DO   2 months ago Benign hypertensive renal  disease   [facility] [place], [place], DO   2 months ago Essential hypertension   [place], [place], DO      Future Appointments            In 2 months Wynetta Emery, Barb Merino, DO [facility], [place]   In 4 months [place], Barb Merino, DO [facility], [place]   In 6 months  [facility], [name]

## 2018-02-05 ENCOUNTER — Encounter: Payer: Self-pay | Admitting: *Deleted

## 2018-02-05 ENCOUNTER — Encounter: Payer: Medicare Other | Attending: Family Medicine | Admitting: *Deleted

## 2018-02-05 VITALS — Wt 220.9 lb

## 2018-02-05 DIAGNOSIS — N181 Chronic kidney disease, stage 1: Secondary | ICD-10-CM | POA: Diagnosis not present

## 2018-02-05 DIAGNOSIS — Z6831 Body mass index (BMI) 31.0-31.9, adult: Secondary | ICD-10-CM | POA: Insufficient documentation

## 2018-02-05 DIAGNOSIS — Z713 Dietary counseling and surveillance: Secondary | ICD-10-CM | POA: Diagnosis not present

## 2018-02-05 DIAGNOSIS — E119 Type 2 diabetes mellitus without complications: Secondary | ICD-10-CM

## 2018-02-05 DIAGNOSIS — E1122 Type 2 diabetes mellitus with diabetic chronic kidney disease: Secondary | ICD-10-CM | POA: Insufficient documentation

## 2018-02-05 NOTE — Progress Notes (Signed)

## 2018-02-10 ENCOUNTER — Other Ambulatory Visit: Payer: Self-pay | Admitting: Family Medicine

## 2018-02-10 DIAGNOSIS — I129 Hypertensive chronic kidney disease with stage 1 through stage 4 chronic kidney disease, or unspecified chronic kidney disease: Secondary | ICD-10-CM

## 2018-02-12 ENCOUNTER — Encounter: Payer: Self-pay | Admitting: Gastroenterology

## 2018-02-12 ENCOUNTER — Encounter: Payer: Medicare Other | Admitting: *Deleted

## 2018-02-12 ENCOUNTER — Encounter: Payer: Self-pay | Admitting: *Deleted

## 2018-02-12 VITALS — Wt 223.9 lb

## 2018-02-12 DIAGNOSIS — N181 Chronic kidney disease, stage 1: Secondary | ICD-10-CM | POA: Diagnosis not present

## 2018-02-12 DIAGNOSIS — E119 Type 2 diabetes mellitus without complications: Secondary | ICD-10-CM

## 2018-02-12 DIAGNOSIS — Z713 Dietary counseling and surveillance: Secondary | ICD-10-CM | POA: Diagnosis not present

## 2018-02-12 DIAGNOSIS — E1122 Type 2 diabetes mellitus with diabetic chronic kidney disease: Secondary | ICD-10-CM | POA: Diagnosis not present

## 2018-02-12 NOTE — Progress Notes (Signed)

## 2018-02-17 DIAGNOSIS — E119 Type 2 diabetes mellitus without complications: Secondary | ICD-10-CM | POA: Diagnosis not present

## 2018-02-17 LAB — HM DIABETES EYE EXAM

## 2018-02-19 ENCOUNTER — Encounter: Payer: Self-pay | Admitting: Dietician

## 2018-02-19 ENCOUNTER — Encounter: Payer: Medicare Other | Admitting: Dietician

## 2018-02-19 VITALS — BP 170/100 | Ht 70.0 in | Wt 224.2 lb

## 2018-02-19 DIAGNOSIS — E119 Type 2 diabetes mellitus without complications: Secondary | ICD-10-CM

## 2018-02-19 DIAGNOSIS — E1122 Type 2 diabetes mellitus with diabetic chronic kidney disease: Secondary | ICD-10-CM | POA: Diagnosis not present

## 2018-02-19 DIAGNOSIS — Z713 Dietary counseling and surveillance: Secondary | ICD-10-CM | POA: Diagnosis not present

## 2018-02-19 DIAGNOSIS — N181 Chronic kidney disease, stage 1: Secondary | ICD-10-CM | POA: Diagnosis not present

## 2018-02-19 NOTE — Progress Notes (Signed)
Appt. Start Time: 0900 Appt. End Time: 1200  Class 3 Diabetes Overview - identify functions of pancreas and insulin; define insulin deficiency vs insulin resistance  Medications - state name, dose, timing of currently prescribed medications; describe types of medications available for diabetes  Psychosocial - identify DM as a source of stress; state the effects of stress on BG control; verbalize appropriate stress management techniques; identify personal stress issues   Nutritional Management - use food labels to identify serving size, content of carbohydrate, fiber, protein, fat, saturated fat and sodium; recognize food sources of fat, saturated fat, trans fat, and sodium, and verbalize goals for intake; describe healthful, appropriate food choices when dining out   Exercise - state a plan for personal exercise; verbalize contraindications for exercise  Self-Monitoring - state importance of SMBG; use SMBG results to effectively manage diabetes; identify importance of regular HbA1C testing and goals for results  Acute Complications - recognize hyperglycemia and hypoglycemia with causes and effects; identify blood glucose results as high, low or in control; list steps in treating and preventing high and low blood glucose  Chronic Complications - state importance of daily self-foot exams; explain appropriate eye and dental care  Lifestyle Changes/Goals & Health/Community Resources - set goals for proper diabetes care; state need for and frequency of healthcare follow-up; describe appropriate community resources for good health (ADA, web sites, apps)   Teaching Materials Used: Class 3 Slide Packet Diabetes Stress Test Stress Management Tools Stress Poem Goal Setting Worksheet Website/App List   

## 2018-02-23 ENCOUNTER — Encounter: Payer: Self-pay | Admitting: Dietician

## 2018-03-25 ENCOUNTER — Other Ambulatory Visit: Payer: Self-pay | Admitting: Family Medicine

## 2018-03-25 NOTE — Telephone Encounter (Signed)
Requested Prescriptions  Pending Prescriptions Disp Refills  . metFORMIN (GLUCOPHAGE-XR) 500 MG 24 hr tablet [Pharmacy Med Name: METFORMIN HCL ER 500 MG TABLET] 360 tablet 1    Sig: TAKE 2 TABLETS (1,000 MG TOTAL) BY MOUTH 2 (TWO) TIMES DAILY WITH A MEAL.     Endocrinology:  Diabetes - Biguanides Failed - 03/25/2018  1:22 AM      Failed - HBA1C is between 0 and 7.9 and within 180 days    HB A1C (BAYER DCA - WAIVED)  Date Value Ref Range Status  12/30/2017 10.0 (H) <7.0 % Final    Comment:                                          Diabetic Adult            <7.0                                       Healthy Adult        4.3 - 5.7                                                           (DCCT/NGSP) American Diabetes Association's Summary of Glycemic Recommendations for Adults with Diabetes: Hemoglobin A1c <7.0%. More stringent glycemic goals (A1c <6.0%) may further reduce complications at the cost of increased risk of hypoglycemia.          Passed - Cr in normal range and within 360 days    Creatinine, Ser  Date Value Ref Range Status  02/02/2018 0.96 0.76 - 1.27 mg/dL Final         Passed - eGFR in normal range and within 360 days    GFR calc Af Amer  Date Value Ref Range Status  02/02/2018 102 >59 mL/min/1.73 Final   GFR calc non Af Amer  Date Value Ref Range Status  02/02/2018 88 >59 mL/min/1.73 Final         Passed - Valid encounter within last 6 months    Recent Outpatient Visits          1 month ago Type 2 diabetes mellitus with stage 1 chronic kidney disease, without long-term current use of insulin ([place])   [facility] [place], [name], DO   2 months ago Type 2 diabetes mellitus with stage 1 chronic kidney disease, without long-term current use of insulin ([place])   [place], [name], DO   3 months ago Benign hypertensive renal disease   [facility] [place], [name], DO   4 months ago Benign hypertensive renal disease   [facility] [place], [place], DO   4 months ago Essential hypertension   [place], [place], DO      Future Appointments            In 1 week Wynetta Emery, Barb Merino, DO [facility], [name]   In 3 months [place], Barb Merino, DO [name]   In 4 months  [name]

## 2018-03-26 ENCOUNTER — Telehealth: Payer: Self-pay

## 2018-03-26 NOTE — Telephone Encounter (Signed)
Patient to bring in the letter from his attorney as to what he needs from [place] and we can see what we can do for him. He states he will bring it next week.

## 2018-03-26 NOTE — Telephone Encounter (Signed)
The workers comp is saying the script for "cream" is worded wrong. Please call him for further instructions.

## 2018-04-06 ENCOUNTER — Ambulatory Visit: Payer: Medicare Other | Admitting: Family Medicine

## 2018-04-08 ENCOUNTER — Encounter: Payer: Self-pay | Admitting: Family Medicine

## 2018-04-08 ENCOUNTER — Ambulatory Visit (INDEPENDENT_AMBULATORY_CARE_PROVIDER_SITE_OTHER): Payer: Medicare Other | Admitting: Family Medicine

## 2018-04-08 VITALS — BP 169/103 | HR 70 | Temp 97.9°F | Ht 70.0 in | Wt 228.4 lb

## 2018-04-08 DIAGNOSIS — E1122 Type 2 diabetes mellitus with diabetic chronic kidney disease: Secondary | ICD-10-CM

## 2018-04-08 DIAGNOSIS — I129 Hypertensive chronic kidney disease with stage 1 through stage 4 chronic kidney disease, or unspecified chronic kidney disease: Secondary | ICD-10-CM | POA: Diagnosis not present

## 2018-04-08 DIAGNOSIS — N181 Chronic kidney disease, stage 1: Secondary | ICD-10-CM | POA: Diagnosis not present

## 2018-04-08 LAB — MICROALBUMIN, URINE WAIVED
Creatinine, Urine Waived: 300 mg/dL (ref 10–300)
Microalb, Ur Waived: 80 mg/L — ABNORMAL HIGH (ref 0–19)

## 2018-04-08 LAB — BAYER DCA HB A1C WAIVED: HB A1C (BAYER DCA - WAIVED): 7.6 % — ABNORMAL HIGH (ref <7.0)

## 2018-04-08 MED ORDER — ATORVASTATIN CALCIUM 10 MG PO TABS: 10.0000 mg | ORAL_TABLET | Freq: Every day | ORAL | 1 refills | Status: DC

## 2018-04-08 MED ORDER — HYDRALAZINE HCL 50 MG PO TABS: 50.0000 mg | ORAL_TABLET | Freq: Two times a day (BID) | ORAL | 1 refills | Status: DC

## 2018-04-08 NOTE — Assessment & Plan Note (Signed)
Not under good control. Will increase hydaralzine to 50mg  BID and recheck 3 months.

## 2018-04-08 NOTE — Assessment & Plan Note (Addendum)
Under much better control with A1c of 7.6, down from 10.0. Continue diet and exercise. Continue metformin. Call with any concerns. Recheck 3 months. Will start atorvastatin and recheck in 3 months.

## 2018-04-08 NOTE — Progress Notes (Signed)
BP (!) 169/103 (BP Location: Left Arm, Patient Position: Sitting, Cuff Size: Large)   Pulse 70   Temp 97.9 F (36.6 C)   Ht 5\' 10"  (1.778 m)   Wt 228 lb 6 oz (103.6 kg)   SpO2 96%   BMI 32.77 kg/m    Subjective:    Patient ID: Kristopher [name], male    DOB: 19-Jan-1961, 57 y.o.   MRN: [id number]  HPI: Kristopher [name] is a 58 y.o. male  Chief Complaint  Patient presents with  . Diabetes  . Hypertension  . Hyperlipidemia   HYPERTENSION / HYPERLIPIDEMIA Satisfied with current treatment? no Duration of hypertension: chronic BP monitoring frequency: not checking BP medication side effects: no Duration of hyperlipidemia: chronic Cholesterol medication side effects: no Cholesterol supplements: none Past cholesterol medications: none Medication compliance: good compliance Aspirin: no- allergic Recent stressors: no Recurrent headaches: no Visual changes: no Palpitations: no Dyspnea: no Chest pain: no Lower extremity edema: no Dizzy/lightheaded: no  DIABETES Hypoglycemic episodes:no Polydipsia/polyuria: no Visual disturbance: no Chest pain: no Paresthesias: no Glucose Monitoring: yes  Accucheck frequency: Daily  Fasting glucose: 120s Taking Insulin?: no Blood Pressure Monitoring: not checking Retinal Examination: Up to Date Foot Exam: Up to Date Diabetic Education: Completed Pneumovax: Up to Date- given today Influenza: Up to Date Aspirin: no  Relevant past medical, surgical, family and social history reviewed and updated as indicated. Interim medical history since our last visit reviewed. Allergies and medications reviewed and updated.  Review of Systems  Constitutional: Negative.   Respiratory: Negative.   Cardiovascular: Negative.   Musculoskeletal: Negative.   Neurological: Negative.   Psychiatric/Behavioral: Negative.     Per HPI unless specifically indicated above     Objective:    BP (!) 169/103 (BP Location: Left Arm, Patient Position:  Sitting, Cuff Size: Large)   Pulse 70   Temp 97.9 F (36.6 C)   Ht 5\' 10"  (1.778 m)   Wt 228 lb 6 oz (103.6 kg)   SpO2 96%   BMI 32.77 kg/m   Wt Readings from Last 3 Encounters:  04/08/18 228 lb 6 oz (103.6 kg)  02/19/18 224 lb 3.2 oz (101.7 kg)  02/12/18 223 lb 14.4 oz (101.6 kg)    Physical Exam Vitals signs and nursing note reviewed.  Constitutional:      General: He is not in acute distress.    Appearance: Normal appearance. He is not ill-appearing, toxic-appearing or diaphoretic.  HENT:     Head: Normocephalic and atraumatic.     Right Ear: External ear normal.     Left Ear: External ear normal.     Nose: Nose normal.     Mouth/Throat:     Mouth: Mucous membranes are moist.     Pharynx: Oropharynx is clear.  Eyes:     General: No scleral icterus.       Right eye: No discharge.        Left eye: No discharge.     Extraocular Movements: Extraocular movements intact.     Conjunctiva/sclera: Conjunctivae normal.     Pupils: Pupils are equal, round, and reactive to light.  Neck:     Musculoskeletal: Normal range of motion and neck supple.  Cardiovascular:     Rate and Rhythm: Normal rate and regular rhythm.     Pulses: Normal pulses.     Heart sounds: Normal heart sounds. No murmur. No friction rub. No gallop.   Pulmonary:     Effort: Pulmonary effort is normal.  No respiratory distress.     Breath sounds: Normal breath sounds. No stridor. No wheezing, rhonchi or rales.  Chest:     Chest wall: No tenderness.  Musculoskeletal: Normal range of motion.  Skin:    General: Skin is warm and dry.     Capillary Refill: Capillary refill takes less than 2 seconds.     Coloration: Skin is not jaundiced or pale.     Findings: No bruising, erythema, lesion or rash.  Neurological:     General: No focal deficit present.     Mental Status: He is alert and oriented to person, place, and time. Mental status is at baseline.  Psychiatric:        Mood and Affect: Mood normal.         Behavior: Behavior normal.        Thought Content: Thought content normal.        Judgment: Judgment normal.     Results for orders placed or performed in visit on 04/08/18  Bayer DCA Hb A1c Waived  Result Value Ref Range   HB A1C (BAYER DCA - WAIVED) 7.6 (H) <7.0 %  Microalbumin, Urine Waived  Result Value Ref Range   Microalb, Ur Waived 80 (H) 0 - 19 mg/L   Creatinine, Urine Waived 300 10 - 300 mg/dL   Microalb/Creat Ratio 30-300 (H) <30 mg/g      Assessment & Plan:   Problem List Items Addressed This Visit      Endocrine   Type 2 diabetes mellitus with stage 1 chronic kidney disease, without long-term current use of insulin ([place]) - Primary    Under much better control with A1c of 7.6, down from 10.0. Continue diet and exercise. Continue metformin. Call with any concerns. Recheck 3 months. Will start atorvastatin and recheck in 3 months.       Relevant Medications   atorvastatin (LIPITOR) 10 MG tablet   Other Relevant Orders   Bayer DCA Hb A1c Waived (Completed)   Comprehensive metabolic panel   Lipid Panel w/o Chol/HDL Ratio   Microalbumin, Urine Waived (Completed)     Genitourinary   Benign hypertensive renal disease    Not under good control. Will increase hydaralzine to 50mg  BID and recheck 3 months.       Relevant Orders   Comprehensive metabolic panel   Microalbumin, Urine Waived (Completed)       Follow up plan: Return in about 3 months (around 07/07/2018).

## 2018-04-09 ENCOUNTER — Encounter: Payer: Self-pay | Admitting: Family Medicine

## 2018-04-09 LAB — COMPREHENSIVE METABOLIC PANEL
ALT: 77 IU/L — ABNORMAL HIGH (ref 0–44)
AST: 55 IU/L — ABNORMAL HIGH (ref 0–40)
Albumin/Globulin Ratio: 1.5 (ref 1.2–2.2)
Albumin: 4.3 g/dL (ref 3.8–4.9)
Alkaline Phosphatase: 115 IU/L (ref 39–117)
BUN/Creatinine Ratio: 10 (ref 9–20)
BUN: 8 mg/dL (ref 6–24)
Bilirubin Total: 0.3 mg/dL (ref 0.0–1.2)
CO2: 25 mmol/L (ref 20–29)
Calcium: 9.6 mg/dL (ref 8.7–10.2)
Chloride: 99 mmol/L (ref 96–106)
Creatinine, Ser: 0.82 mg/dL (ref 0.76–1.27)
GFR calc Af Amer: 114 mL/min/{1.73_m2} (ref >59)
GFR calc non Af Amer: 99 mL/min/{1.73_m2} (ref >59)
Globulin, Total: 2.8 g/dL (ref 1.5–4.5)
Glucose: 153 mg/dL — ABNORMAL HIGH (ref 65–99)
Potassium: 3.8 mmol/L (ref 3.5–5.2)
Sodium: 140 mmol/L (ref 134–144)
Total Protein: 7.1 g/dL (ref 6.0–8.5)

## 2018-04-09 LAB — LIPID PANEL W/O CHOL/HDL RATIO
Cholesterol, Total: 179 mg/dL (ref 100–199)
HDL: 29 mg/dL — ABNORMAL LOW (ref >39)
LDL Calculated: 100 mg/dL — ABNORMAL HIGH (ref 0–99)
Triglycerides: 250 mg/dL — ABNORMAL HIGH (ref 0–149)
VLDL Cholesterol Cal: 50 mg/dL — ABNORMAL HIGH (ref 5–40)

## 2018-04-17 ENCOUNTER — Encounter: Payer: Self-pay | Admitting: Family Medicine

## 2018-04-17 ENCOUNTER — Ambulatory Visit (INDEPENDENT_AMBULATORY_CARE_PROVIDER_SITE_OTHER): Payer: Medicare Other | Admitting: Family Medicine

## 2018-04-17 ENCOUNTER — Other Ambulatory Visit: Payer: Self-pay

## 2018-04-17 VITALS — BP 152/107 | HR 74 | Temp 99.0°F | Ht 70.0 in | Wt 230.0 lb

## 2018-04-17 DIAGNOSIS — J441 Chronic obstructive pulmonary disease with (acute) exacerbation: Secondary | ICD-10-CM

## 2018-04-17 MED ORDER — BENZONATATE 200 MG PO CAPS: 200.0000 mg | ORAL_CAPSULE | Freq: Two times a day (BID) | ORAL | 0 refills | Status: DC | PRN

## 2018-04-17 MED ORDER — AZITHROMYCIN 250 MG PO TABS: ORAL_TABLET | ORAL | 0 refills | Status: DC

## 2018-04-17 MED ORDER — PREDNISONE 10 MG PO TABS: ORAL_TABLET | ORAL | 0 refills | Status: DC

## 2018-04-17 NOTE — Progress Notes (Signed)
BP (!) 152/107   Pulse 74   Temp 99 F (37.2 C) (Oral)   Ht 5\' 10"  (1.778 m)   Wt 230 lb (104.3 kg)   SpO2 96%   BMI 33.00 kg/m    Subjective:    Patient ID: Kristopher [name], male    DOB: 1960-04-30, 58 y.o.   MRN: [id number]  HPI: Kristopher [name] is a 58 y.o. male  Chief Complaint  Patient presents with  . Cough    x 3 days. pt states it is painful when cough around ribs  . Headache  . Nasal Congestion   UPPER RESPIRATORY TRACT INFECTION Duration: 3 days Worst symptom: congestion, coughing Fever: yes Cough: yes Shortness of breath: yes Wheezing: yes Chest pain: no Chest tightness: yes Chest congestion: yes Nasal congestion: no Runny nose: no Post nasal drip: no Sneezing: yes Sore throat: no Swollen glands: no Sinus pressure: yes Headache: yes Face pain: no Toothache: no Ear pain: yes bilateral Ear pressure: yes bilateral Eyes red/itching:no Eye drainage/crusting: no  Vomiting: no Rash: no Fatigue: yes Sick contacts: yes Strep contacts: no  Context: worse Recurrent sinusitis: no Relief with OTC cold/cough medications: yes  Treatments attempted: cold/sinus    Relevant past medical, surgical, family and social history reviewed and updated as indicated. Interim medical history since our last visit reviewed. Allergies and medications reviewed and updated.  Review of Systems  Constitutional: Positive for fatigue and fever. Negative for activity change, appetite change, chills, diaphoresis and unexpected weight change.  HENT: Positive for congestion, postnasal drip, rhinorrhea, sinus pressure, sinus pain, sneezing and sore throat. Negative for dental problem, drooling, ear discharge, ear pain, facial swelling, hearing loss, mouth sores, nosebleeds, tinnitus, trouble swallowing and voice change.   Eyes: Negative.   Respiratory: Positive for cough, chest tightness, shortness of breath and wheezing. Negative for apnea, choking and stridor.     Cardiovascular: Negative.   Gastrointestinal: Negative.   Neurological: Negative.   Psychiatric/Behavioral: Negative.     Per HPI unless specifically indicated above     Objective:    BP (!) 152/107   Pulse 74   Temp 99 F (37.2 C) (Oral)   Ht 5\' 10"  (1.778 m)   Wt 230 lb (104.3 kg)   SpO2 96%   BMI 33.00 kg/m   Wt Readings from Last 3 Encounters:  04/17/18 230 lb (104.3 kg)  04/08/18 228 lb 6 oz (103.6 kg)  02/19/18 224 lb 3.2 oz (101.7 kg)    Physical Exam Vitals signs and nursing note reviewed.  Constitutional:      General: He is not in acute distress.    Appearance: Normal appearance. He is not ill-appearing, toxic-appearing or diaphoretic.  HENT:     Head: Normocephalic and atraumatic.     Right Ear: Tympanic membrane, ear canal and external ear normal. There is no impacted cerumen.     Left Ear: Tympanic membrane, ear canal and external ear normal. There is no impacted cerumen.     Nose: Congestion and rhinorrhea present.     Mouth/Throat:     Mouth: Mucous membranes are moist.     Pharynx: Oropharynx is clear. No oropharyngeal exudate or posterior oropharyngeal erythema.  Eyes:     General: No scleral icterus.       Right eye: No discharge.        Left eye: No discharge.     Extraocular Movements: Extraocular movements intact.     Conjunctiva/sclera: Conjunctivae normal.  Pupils: Pupils are equal, round, and reactive to light.  Neck:     Musculoskeletal: Normal range of motion and neck supple. No neck rigidity or muscular tenderness.     Vascular: No carotid bruit.  Cardiovascular:     Rate and Rhythm: Normal rate and regular rhythm.     Pulses: Normal pulses.     Heart sounds: Normal heart sounds. No murmur. No friction rub. No gallop.   Pulmonary:     Effort: Pulmonary effort is normal. No respiratory distress.     Breath sounds: Normal breath sounds. No stridor. No wheezing, rhonchi or rales.  Chest:     Chest wall: No tenderness.   Musculoskeletal: Normal range of motion.  Lymphadenopathy:     Cervical: Cervical adenopathy present.  Skin:    General: Skin is warm and dry.     Capillary Refill: Capillary refill takes less than 2 seconds.     Coloration: Skin is not jaundiced or pale.     Findings: No bruising, erythema, lesion or rash.  Neurological:     General: No focal deficit present.     Mental Status: He is alert and oriented to person, place, and time. Mental status is at baseline.     Cranial Nerves: No cranial nerve deficit.     Sensory: No sensory deficit.     Motor: No weakness.     Coordination: Coordination normal.     Gait: Gait normal.     Deep Tendon Reflexes: Reflexes normal.  Psychiatric:        Mood and Affect: Mood normal.        Behavior: Behavior normal.        Thought Content: Thought content normal.        Judgment: Judgment normal.     Results for orders placed or performed in visit on 04/08/18  Bayer DCA Hb A1c Waived  Result Value Ref Range   HB A1C (BAYER DCA - WAIVED) 7.6 (H) <7.0 %  Comprehensive metabolic panel  Result Value Ref Range   Glucose 153 (H) 65 - 99 mg/dL   BUN 8 6 - 24 mg/dL   Creatinine, Ser 0.82 0.76 - 1.27 mg/dL   GFR calc non Af Amer 99 >59 mL/min/1.73   GFR calc Af Amer 114 >59 mL/min/1.73   BUN/Creatinine Ratio 10 9 - 20   Sodium 140 134 - 144 mmol/L   Potassium 3.8 3.5 - 5.2 mmol/L   Chloride 99 96 - 106 mmol/L   CO2 25 20 - 29 mmol/L   Calcium 9.6 8.7 - 10.2 mg/dL   Total Protein 7.1 6.0 - 8.5 g/dL   Albumin 4.3 3.8 - 4.9 g/dL   Globulin, Total 2.8 1.5 - 4.5 g/dL   Albumin/Globulin Ratio 1.5 1.2 - 2.2   Bilirubin Total 0.3 0.0 - 1.2 mg/dL   Alkaline Phosphatase 115 39 - 117 IU/L   AST 55 (H) 0 - 40 IU/L   ALT 77 (H) 0 - 44 IU/L  Lipid Panel w/o Chol/HDL Ratio  Result Value Ref Range   Cholesterol, Total 179 100 - 199 mg/dL   Triglycerides 250 (H) 0 - 149 mg/dL   HDL 29 (L) >39 mg/dL   VLDL Cholesterol Cal 50 (H) 5 - 40 mg/dL   LDL  Calculated 100 (H) 0 - 99 mg/dL  Microalbumin, Urine Waived  Result Value Ref Range   Microalb, Ur Waived 80 (H) 0 - 19 mg/L   Creatinine, Urine Waived 300 10 - 300  mg/dL   Microalb/Creat Ratio 30-300 (H) <30 mg/g      Assessment & Plan:   Problem List Items Addressed This Visit    None    Visit Diagnoses    COPD exacerbation ([place])    -  Primary   Will treat with prednisone, tessalon and azithromycin. Call with any concerns. Continue to monitor.    Relevant Medications   predniSONE (DELTASONE) 10 MG tablet   azithromycin (ZITHROMAX) 250 MG tablet   benzonatate (TESSALON) 200 MG capsule       Follow up plan: Return in about 2 weeks (around 05/01/2018) for lung recheck.

## 2018-04-22 ENCOUNTER — Encounter: Payer: Self-pay | Admitting: Nurse Practitioner

## 2018-04-22 ENCOUNTER — Ambulatory Visit: Payer: Medicare Other | Attending: Nurse Practitioner | Admitting: Nurse Practitioner

## 2018-04-22 VITALS — BP 148/86 | HR 69 | Temp 98.0°F | Resp 16 | Ht 70.0 in | Wt 226.0 lb

## 2018-04-22 DIAGNOSIS — M7918 Myalgia, other site: Secondary | ICD-10-CM

## 2018-04-22 DIAGNOSIS — G894 Chronic pain syndrome: Secondary | ICD-10-CM

## 2018-04-22 DIAGNOSIS — M5416 Radiculopathy, lumbar region: Secondary | ICD-10-CM | POA: Diagnosis not present

## 2018-04-22 DIAGNOSIS — M25552 Pain in left hip: Secondary | ICD-10-CM

## 2018-04-22 DIAGNOSIS — G8929 Other chronic pain: Secondary | ICD-10-CM | POA: Diagnosis not present

## 2018-04-22 DIAGNOSIS — M47816 Spondylosis without myelopathy or radiculopathy, lumbar region: Secondary | ICD-10-CM

## 2018-04-22 MED ORDER — NONFORMULARY OR COMPOUNDED ITEM: 2 refills | Status: DC

## 2018-04-22 MED ORDER — OXYCODONE HCL 10 MG PO TABS: 10.0000 mg | ORAL_TABLET | Freq: Four times a day (QID) | ORAL | 0 refills | Status: DC | PRN

## 2018-04-22 NOTE — Progress Notes (Signed)
Patient's Name: Kristopher [name]  MRN: [id number]  Referring Provider: [name], DO  DOB: 1960-04-07  PCP: [name], DO  DOS: 04/22/2018  Note by: Dionisio David, NP  Service setting: Ambulatory outpatient  Specialty: Interventional Pain Management  Location: [facility] (AMB) Pain Management Facility    Patient type: Established   HPI  Reason for Visit: [name] is a 58 y.o. year old, male patient, who comes today with a chief complaint of Back Pain (lower bilateral ) Last Appointment: His last appointment at our practice was on 03/26/2018. I last saw him on 01/20/2018.  Pain Assessment: Today, [name] describes the severity of the Chronic pain as a 3 /10. He indicates the location/referral of the pain to be Back Lower, Left, Right/both hips and legs . Onset was: More than a month ago. The quality of pain is described as Burning, Discomfort, Constant, Pins and needles. Temporal description, or timing of pain is: Constant. Possible modifying factors: medications, topical analgesic . [name]  height is _0  (1.778 m) and weight is 226 lb (102.5 kg). His oral temperature is 98 F (36.7 C). His blood pressure is 148/86 (abnormal) and his pulse is 69. His respiration is 16 and oxygen saturation is 99%.  He admits that he has good days and bad days. He feels like the medication keeps him going. He is done with his PT. He feels like it is better than the original hip. He states the weather makes the pain worse; constant pounding.   Controlled Substance Pharmacotherapy Assessment REMS (Risk Evaluation and Mitigation Strategy)  Analgesic:Oxycodone IR 10 mg every 6 hours (40 mg/day of oxycodone) MME/day:60 mg/day  Janett Billow, RN  04/22/2018  9:00 AM  Sign when Signing Visit Nursing Pain Medication Assessment:  Safety precautions to be maintained throughout the outpatient stay will include: orient to surroundings, keep bed in low position, maintain call bell within  reach at all times, provide assistance with transfer out of bed and ambulation.  Medication Inspection Compliance: Pill count conducted under aseptic conditions, in front of the patient. Neither the pills nor the bottle was removed from the patient's sight at any time. Once count was completed pills were immediately returned to the patient in their original bottle.  Medication: Oxycodone IR Pill/Patch Count: 26 of 120 pills remain Pill/Patch Appearance: Markings consistent with prescribed medication Bottle Appearance: Standard pharmacy container. Clearly labeled. Filled Date: 01 / 26 / 2020 Last Medication intake:  Today   Pharmacokinetics: Liberation and absorption (onset of action): WNL Distribution (time to peak effect): WNL Metabolism and excretion (duration of action): WNL         Pharmacodynamics: Desired effects: Analgesia: [name] reports >50% benefit. Functional ability: Patient reports that medication allows him to accomplish basic ADLs Clinically meaningful improvement in function (CMIF): Sustained CMIF goals met Perceived effectiveness: Described as relatively effective, allowing for increase in activities of daily living (ADL) Undesirable effects: Side-effects or Adverse reactions: None reported Monitoring: [place] PMP: Online review of the past 44-monthperiod conducted. Compliant with practice rules and regulations Last UDS on record: Summary  Date Value Ref Range Status  01/20/2018 FINAL  Final    Comment:    ==================================================================== TOXASSURE SSELECT 16(MW) ==================================================================== Test                             Result       Flag  Units Drug Present   Oxycodone                      1855                    ng/mg creat   Oxymorphone                    685                     ng/mg creat   Noroxycodone                   1293                    ng/mg creat   Noroxymorphone                  103                     ng/mg creat    Sources of oxycodone are scheduled prescription medications.    Oxymorphone, noroxycodone, and noroxymorphone are expected    metabolites of oxycodone. Oxymorphone is also available as a    scheduled prescription medication. ==================================================================== Test                      Result    Flag   Units      Ref Range   Creatinine              96               mg/dL      >=20 ==================================================================== Declared Medications:  Medication list was not provided. ==================================================================== For clinical consultation, please call [phone]. ====================================================================    UDS interpretation: Compliant          Medication Assessment Form: Reviewed. Patient indicates being compliant with therapy Treatment compliance: Compliant Risk Assessment Profile: Aberrant behavior: See initial evaluations. None observed or detected today Comorbid factors increasing risk of overdose: See initial evaluation. No additional risks detected today Opioid risk tool (ORT):  Opioid Risk  04/22/2018  Alcohol 0  Illegal Drugs 0  Rx Drugs 0  Alcohol 0  Illegal Drugs 0  Rx Drugs 0  Age between 16-45 years  0  History of Preadolescent Sexual Abuse -  Psychological Disease 0  ADD -  OCD -  Bipolar -  Depression 0  Opioid Risk Tool Scoring 0  Opioid Risk Interpretation Low Risk    ORT Scoring interpretation table:  Score <3 = Low Risk for SUD  Score between 4-7 = Moderate Risk for SUD  Score >8 = High Risk for Opioid Abuse   Risk of substance use disorder (SUD): Low  Risk Mitigation Strategies:  Patient Counseling: Covered Patient-Prescriber Agreement (PPA): Present and active  Notification to other healthcare providers: Done  Pharmacologic Plan: No change in therapy, at this time.              ROS  Constitutional: Denies any fever or chills Gastrointestinal: No reported hemesis, hematochezia, vomiting, or acute GI distress Musculoskeletal: Denies any acute onset joint swelling, redness, loss of ROM, or weakness Neurological: No reported episodes of acute onset apraxia, aphasia, dysarthria, agnosia, amnesia, paralysis, loss of coordination, or loss of consciousness  Medication Review  NONFORMULARY OR COMPOUNDED ITEM, Oxycodone HCl, SUMAtriptan, atorvastatin, azithromycin, benzonatate, diphenhydrAMINE, esomeprazole, hydrALAZINE, metFORMIN, metoprolol succinate, potassium chloride, and predniSONE  History Review  Allergy: [name] is allergic to aspirin; epinephrine; lidocaine; novocain [procaine]; penicillins; strawberry extract; and shellfish allergy. Drug: [name]  reports current drug use. Drug: Hydromorphone. Alcohol:  reports no history of alcohol use. Tobacco:  reports that he has been smoking cigarettes. He has a 35.00 pack-year smoking history. He has never used smokeless tobacco. Social: [name]  reports that he has been smoking cigarettes. He has a 35.00 pack-year smoking history. He has never used smokeless tobacco. He reports current drug use. Drug: Hydromorphone. He reports that he does not drink alcohol. Medical:  has a past medical history of Allergy, Arthritis, Benign hypertensive kidney disease, Chronic back pain, Diabetes mellitus, type 2 ([place]), Hypertension, and Migraines. Surgical: [name]  has a past surgical history that includes Back surgery; Appendectomy; Leg Surgery; Knee surgery (Left); Foot surgery (Left); Colonoscopy with propofol (N/A, 02/19/2016); polypectomy (N/A, 02/19/2016); Total hip arthroplasty (Left, 12/02/2017); Colonoscopy with propofol (N/A, 01/19/2018); polypectomy (01/19/2018); DG OPERATIVE LEFT HIP ([place] HX); and Joint replacement (Left, 12/2017). Family: family history includes Cancer in his father; Diabetes in his sister;  Thrombosis in his sister. Problem List: [name] has Chronic low back pain (Location of Primary Source of Pain) (Bilateral) (L>R); Lumbar spondylosis; Chronic lumbar radicular pain (Location of Secondary source of pain) (S1 dermatomal) (Left); 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); Chronic neck pain (posterior midline) (Bilateral) (L>R); Cervical spondylosis; Chronic cervical radicular pain (Location of Tertiary source of pain) (Bilateral) (C5/C6 dermatome) (L>R); Opioid dependence ([place]); Cervical spinal stenosis (C4-5); Cervical foraminal stenosis (Bilateral C5-6); Retrolisthesis of L5-S1; Cervical disc herniation (C4-5 and C5-6); Lumbar disc herniation (L5-S1); Chronic sacroiliac joint pain (Bilateral) (L>R); Chronic hip pain (Left); Lumbar facet syndrome (Location of Primary Source of Pain) (Bilateral) (L>R); Chronic lower extremity pain (Location of Secondary source of pain) (Left); Chronic upper extremity pain (Location of Tertiary source of pain) (Bilateral) (L>R); Greater occipital neuralgia (Right); and Musculoskeletal pain, chronic on their pertinent problem list.  Lab Review  Kidney Function Lab Results  Component Value Date   BUN 8 04/08/2018   CREATININE 0.82 04/08/2018   BCR 10 04/08/2018   GFRAA 114 04/08/2018   GFRNONAA 99 04/08/2018  Liver Function Lab Results  Component Value Date   AST 55 (H) 04/08/2018   ALT 77 (H) 04/08/2018   ALBUMIN 4.3 04/08/2018  Note: Above Lab results reviewed.  Imaging Review  DG HIP UNILAT W OR W/O PELVIS 2-3 VIEWS LEFT CLINICAL DATA:  Post LEFT hip arthroplasty  EXAM: DG HIP (WITH OR WITHOUT PELVIS) 2-3V LEFT  COMPARISON:  Portable exam at 1226 hrs compared to 08/14/2017  FINDINGS: Interval placement of a LEFT hip prosthesis.  Faint linear lucency traverses the LEFT superior pubic ramus, potentially artifact though a nondisplaced fracture is not completely excluded.  No additional fracture, dislocation, or  bone destruction.  Prior L5-S1 fusion.  IMPRESSION: New LEFT hip prosthesis without dislocation.  Faint linear lucency traverses the LEFT superior pubic ramus, potentially an artifact though a nondisplaced fracture is not completely excluded; follow-up dedicated nonportable pelvic radiograph recommended to evaluate.  Electronically Signed   By: Lavonia Dana M.D.   On: 12/02/2017 12:54 DG HIP OPERATIVE UNILAT W OR W/O PELVIS LEFT CLINICAL DATA:  58 year old male with left hip replacement.  EXAM: OPERATIVE 2 HIP (WITH PELVIS IF PERFORMED) left hip VIEWS  8.0 mGy.  TECHNIQUE: Fluoroscopic spot image(s) were submitted for interpretation post-operatively.  COMPARISON:  08/14/2017.  FINDINGS: Post total left hip replacement. This appears  in satisfactory position without complication noted on limited C-arm imaging.  IMPRESSION: Post total left hip replacement.  Electronically Signed   By: Genia Del M.D.   On: 12/02/2017 12:06 Note: Above imaging results reviewed.        Physical Exam  General appearance: Well nourished, well developed, and well hydrated. In no apparent acute distress Mental status: Alert, oriented x 3 (person, place, & time)       Respiratory: No evidence of acute respiratory distress Eyes: PERLA Vitals: BP (!) 148/86 (BP Location: Left Arm, Patient Position: Sitting, Cuff Size: Large)   Pulse 69   Temp 98 F (36.7 C) (Oral)   Resp 16   Ht _0  (1.778 m)   Wt 226 lb (102.5 kg)   SpO2 99%   BMI 32.43 kg/m  BMI: Estimated body mass index is 32.43 kg/m as calculated from the following:   Height as of this encounter: _1  (1.778 m).   Weight as of this encounter: 226 lb (102.5 kg). Ideal: Ideal body weight: 73 kg (160 lb 15 oz) Adjusted ideal body weight: 84.8 kg (186 lb 15.4 oz) Lumbar Spine Area Exam  Skin & Axial Inspection: Well healed scar from previous spine surgery detected Alignment: Symmetrical Functional ROM: Unrestricted ROM        Stability: No instability detected Muscle Tone/Strength: Functionally intact. No obvious neuro-muscular anomalies detected. Sensory (Neurological): Unimpaired Palpation: No palpable anomalies       Provocative Tests: Hyperextension/rotation test: deferred today       Lumbar quadrant test (Kemp's test): deferred today       Lateral bending test: deferred today       Patrick's Maneuver: deferred today                   Lower Extremity Exam    Side: Right lower extremity  Side: Left lower extremity  Stability: No instability observed          Stability: No instability observed          Skin & Extremity Inspection: Skin color, temperature, and hair growth are WNL. No peripheral edema or cyanosis. No masses, redness, swelling, asymmetry, or associated skin lesions. No contractures.  Skin & Extremity Inspection: Evidence of prior arthroplastic surgery  Functional ROM: Unrestricted ROM                  Functional ROM: Unrestricted ROM                  Muscle Tone/Strength: Functionally intact. No obvious neuro-muscular anomalies detected.  Muscle Tone/Strength: Functionally intact. No obvious neuro-muscular anomalies detected.  Sensory (Neurological): Referred pain pattern        Sensory (Neurological): Referred pain pattern            Palpation: No palpable anomalies  Palpation: No palpable anomalies    Assessment   Status Diagnosis  Controlled Controlled Controlled 1. Lumbar spondylosis   2. Chronic hip pain (Left)   3. Chronic lumbar radicular pain (Location of Secondary source of pain) (S1 dermatomal) (Left)   4. Chronic pain syndrome   5. Musculoskeletal pain, chronic      Updated Problems: No problems updated.  Plan of Care  Pharmacotherapy (Medications Ordered): Meds ordered this encounter  Medications  . Oxycodone HCl 10 MG TABS    Sig: Take 1 tablet (10 mg total) by mouth every 6 (six) hours as needed for up to 30 days.    Dispense:  120 tablet  Refill:  0    Do  not place this medication, or any other prescription from our practice, on "Automatic Refill". Patient may have prescription filled one day early if pharmacy is closed on scheduled refill date.    Order Specific Question:   Supervising Provider    Answer:   [name] [phone]  . Oxycodone HCl 10 MG TABS    Sig: Take 1 tablet (10 mg total) by mouth every 6 (six) hours as needed for up to 30 days.    Dispense:  120 tablet    Refill:  0    Do not place this medication, or any other prescription from our practice, on "Automatic Refill". Patient may have prescription filled one day early if pharmacy is closed on scheduled refill date.    Order Specific Question:   Supervising Provider    Answer:   [name] [phone]  . Oxycodone HCl 10 MG TABS    Sig: Take 1 tablet (10 mg total) by mouth every 6 (six) hours as needed for up to 30 days.    Dispense:  120 tablet    Refill:  0    Do not place this medication, or any other prescription from our practice, on "Automatic Refill". Patient may have prescription filled one day early if pharmacy is closed on scheduled refill date.    Order Specific Question:   Supervising Provider    Answer:   [name] [phone]  . NONFORMULARY OR COMPOUNDED ITEM    Sig: 10% Ketamine/2% Cyclobenzaprine/6% Gabapentin Cream Sig: 1-2 ml to affected area 3-4 times/day. Amount: 240 GM    Dispense:  240 each    Refill:  2    Order Specific Question:   Supervising Provider    Answer:   [name] [phone]   Administered today: Venia Carbon. [place] had no medications administered during this visit.  Orders:  No orders of the defined types were placed in this encounter.  Interventional options: Planned follow-up:   Not at this time. Plan: Return in about 3 months (around 07/21/2018) for MedMgmt.   Note by: Dionisio David, NP Date: 04/22/2018; Time: 9:06 AM

## 2018-04-22 NOTE — Patient Instructions (Addendum)
____________________________________________________________________________________________  Medication Rules  Purpose: To inform patients, and their family members, of our rules and regulations.  Applies to: All patients receiving prescriptions (written or electronic).  Pharmacy of record: Pharmacy where electronic prescriptions will be sent. If written prescriptions are taken to a different pharmacy, please inform the nursing staff. The pharmacy listed in the electronic medical record should be the one where you would like electronic prescriptions to be sent.  Electronic prescriptions: In compliance with the [place] Strengthen Opioid Misuse Prevention (STOP) Act of 2017 (Session Law 2017-74/H243), effective March 04, 2018, all controlled substances must be electronically prescribed. Calling prescriptions to the pharmacy will cease to exist.  Prescription refills: Only during scheduled appointments. Applies to all prescriptions.  NOTE: The following applies primarily to controlled substances (Opioid* Pain Medications).   Patient's responsibilities: 1. Pain Pills: Bring all pain pills to every appointment (except for procedure appointments). 2. Pill Bottles: Bring pills in original pharmacy bottle. Always bring the newest bottle. Bring bottle, even if empty. 3. Medication refills: You are responsible for knowing and keeping track of what medications you take and those you need refilled. The day before your appointment: write a list of all prescriptions that need to be refilled. The day of the appointment: give the list to the admitting nurse. Prescriptions will be written only during appointments. No prescriptions will be written on procedure days. If you forget a medication: it will not be "Called in", "Faxed", or "electronically sent". You will need to get another appointment to get these prescribed. No early refills. Do not call asking to have your prescription filled  early. 4. Prescription Accuracy: You are responsible for carefully inspecting your prescriptions before leaving our office. Have the discharge nurse carefully go over each prescription with you, before taking them home. Make sure that your name is accurately spelled, that your address is correct. Check the name and dose of your medication to make sure it is accurate. Check the number of pills, and the written instructions to make sure they are clear and accurate. Make sure that you are given enough medication to last until your next medication refill appointment. 5. Taking Medication: Take medication as prescribed. When it comes to controlled substances, taking less pills or less frequently than prescribed is permitted and encouraged. Never take more pills than instructed. Never take medication more frequently than prescribed.  6. Inform other Doctors: Always inform, all of your healthcare providers, of all the medications you take. 7. Pain Medication from other Providers: You are not allowed to accept any additional pain medication from any other Doctor or Healthcare provider. There are two exceptions to this rule. (see below) In the event that you require additional pain medication, you are responsible for notifying us, as stated below. 8. Medication Agreement: You are responsible for carefully reading and following our Medication Agreement. This must be signed before receiving any prescriptions from our practice. Safely store a copy of your signed Agreement. Violations to the Agreement will result in no further prescriptions. (Additional copies of our Medication Agreement are available upon request.) 9. Laws, Rules, & Regulations: All patients are expected to follow all Federal and State Laws, Statutes, Rules, & Regulations. Ignorance of the Laws does not constitute a valid excuse. The use of any illegal substances is prohibited. 10. Adopted CDC guidelines & recommendations: Target dosing levels will be  at or below 60 MME/day. Use of benzodiazepines** is not recommended.  Exceptions: There are only two exceptions to the rule of not   receiving pain medications from other Healthcare Providers. 1. Exception #1 (Emergencies): In the event of an emergency (i.e.: accident requiring emergency care), you are allowed to receive additional pain medication. However, you are responsible for: As soon as you are able, call our office [phone], at any time of the day or night, and leave a message stating your name, the date and nature of the emergency, and the name and dose of the medication prescribed. In the event that your call is answered by a member of our staff, make sure to document and save the date, time, and the name of the person that took your information.  2. Exception #2 (Planned Surgery): In the event that you are scheduled by another doctor or dentist to have any type of surgery or procedure, you are allowed (for a period no longer than 30 days), to receive additional pain medication, for the acute post-op pain. However, in this case, you are responsible for picking up a copy of our "Post-op Pain Management for Surgeons" handout, and giving it to your surgeon or dentist. This document is available at our office, and does not require an appointment to obtain it. Simply go to our office during business hours (Monday-Thursday from 8:00 AM to 4:00 PM) (Friday 8:00 AM to 12:00 Noon) or if you have a scheduled appointment with [place], prior to your surgery, and ask for it by name. In addition, you will need to provide [place] with your name, name of your surgeon, type of surgery, and date of procedure or surgery.  *Opioid medications include: morphine, codeine, oxycodone, oxymorphone, hydrocodone, hydromorphone, meperidine, tramadol, tapentadol, buprenorphine, fentanyl, methadone. **Benzodiazepine medications include: diazepam (Valium), alprazolam (Xanax), clonazepam (Klonopine), lorazepam (Ativan), clorazepate  (Tranxene), chlordiazepoxide (Librium), estazolam (Prosom), oxazepam (Serax), temazepam (Restoril), triazolam (Halcion) (Last updated: 05/01/2017) ____________________________________________________________________________________________  Kristopher [name] have been handed a script for compounded cream and you have scripts for oxycodone sent to your pharmacy

## 2018-04-22 NOTE — Progress Notes (Signed)
Nursing Pain Medication Assessment:  Safety precautions to be maintained throughout the outpatient stay will include: orient to surroundings, keep bed in low position, maintain call bell within reach at all times, provide assistance with transfer out of bed and ambulation.  Medication Inspection Compliance: Pill count conducted under aseptic conditions, in front of the patient. Neither the pills nor the bottle was removed from the patient's sight at any time. Once count was completed pills were immediately returned to the patient in their original bottle.  Medication: Oxycodone IR Pill/Patch Count: 26 of 120 pills remain Pill/Patch Appearance: Markings consistent with prescribed medication Bottle Appearance: Standard pharmacy container. Clearly labeled. Filled Date: 01 / 26 / 2020 Last Medication intake:  Today

## 2018-04-23 DIAGNOSIS — E119 Type 2 diabetes mellitus without complications: Secondary | ICD-10-CM | POA: Diagnosis not present

## 2018-05-01 ENCOUNTER — Encounter: Payer: Self-pay | Admitting: Family Medicine

## 2018-05-01 ENCOUNTER — Other Ambulatory Visit: Payer: Self-pay

## 2018-05-01 ENCOUNTER — Ambulatory Visit (INDEPENDENT_AMBULATORY_CARE_PROVIDER_SITE_OTHER): Payer: Medicare Other | Admitting: Family Medicine

## 2018-05-01 VITALS — BP 144/104 | HR 79 | Temp 99.3°F | Ht 70.0 in | Wt 230.0 lb

## 2018-05-01 DIAGNOSIS — J441 Chronic obstructive pulmonary disease with (acute) exacerbation: Secondary | ICD-10-CM

## 2018-05-01 MED ORDER — ALBUTEROL SULFATE HFA 108 (90 BASE) MCG/ACT IN AERS: 2.0000 | INHALATION_SPRAY | Freq: Four times a day (QID) | RESPIRATORY_TRACT | 3 refills | Status: DC | PRN

## 2018-05-01 NOTE — Progress Notes (Signed)
BP (!) 144/104   Pulse 79   Temp 99.3 F (37.4 C) (Oral)   Ht 5\' 10"  (1.778 m)   Wt 230 lb (104.3 kg)   SpO2 96%   BMI 33.00 kg/m    Subjective:    Patient ID: Kristopher [name], male    DOB: [birth year], 58 y.o.   MRN: [id number]  HPI: Kristopher [name] is a 58 y.o. male  Chief Complaint  Patient presents with  . Lungs recheck    2 w f/u   Doing much better. No SOB. No fevers. Occasional cough. No other concerns.   Relevant past medical, surgical, family and social history reviewed and updated as indicated. Interim medical history since our last visit reviewed. Allergies and medications reviewed and updated.  Review of Systems  Constitutional: Negative.   HENT: Negative.   Respiratory: Negative.   Cardiovascular: Negative.   Psychiatric/Behavioral: Negative.     Per HPI unless specifically indicated above     Objective:    BP (!) 144/104   Pulse 79   Temp 99.3 F (37.4 C) (Oral)   Ht 5\' 10"  (1.778 m)   Wt 230 lb (104.3 kg)   SpO2 96%   BMI 33.00 kg/m   Wt Readings from Last 3 Encounters:  05/01/18 230 lb (104.3 kg)  04/22/18 226 lb (102.5 kg)  04/17/18 230 lb (104.3 kg)    Physical Exam Vitals signs and nursing note reviewed.  Constitutional:      General: He is not in acute distress.    Appearance: Normal appearance. He is not ill-appearing, toxic-appearing or diaphoretic.  HENT:     Head: Normocephalic and atraumatic.     Right Ear: External ear normal.     Left Ear: External ear normal.     Nose: Nose normal.     Mouth/Throat:     Mouth: Mucous membranes are moist.     Pharynx: Oropharynx is clear.  Eyes:     General: No scleral icterus.       Right eye: No discharge.        Left eye: No discharge.     Extraocular Movements: Extraocular movements intact.     Conjunctiva/sclera: Conjunctivae normal.     Pupils: Pupils are equal, round, and reactive to light.  Neck:     Musculoskeletal: Normal range of motion and neck supple.    Cardiovascular:     Rate and Rhythm: Normal rate and regular rhythm.     Pulses: Normal pulses.     Heart sounds: Normal heart sounds. No murmur. No friction rub. No gallop.   Pulmonary:     Effort: Pulmonary effort is normal. No respiratory distress.     Breath sounds: No stridor. Wheezing (RUL) present. No rhonchi or rales.  Chest:     Chest wall: No tenderness.  Musculoskeletal: Normal range of motion.  Skin:    General: Skin is warm and dry.     Capillary Refill: Capillary refill takes less than 2 seconds.     Coloration: Skin is not jaundiced or pale.     Findings: No bruising, erythema, lesion or rash.  Neurological:     General: No focal deficit present.     Mental Status: He is alert and oriented to person, place, and time. Mental status is at baseline.  Psychiatric:        Mood and Affect: Mood normal.        Behavior: Behavior normal.  Thought Content: Thought content normal.        Judgment: Judgment normal.     Results for orders placed or performed in visit on 04/08/18  Bayer DCA Hb A1c Waived  Result Value Ref Range   HB A1C (BAYER DCA - WAIVED) 7.6 (H) <7.0 %  Comprehensive metabolic panel  Result Value Ref Range   Glucose 153 (H) 65 - 99 mg/dL   BUN 8 6 - 24 mg/dL   Creatinine, Ser 0.82 0.76 - 1.27 mg/dL   GFR calc non Af Amer 99 >59 mL/min/1.73   GFR calc Af Amer 114 >59 mL/min/1.73   BUN/Creatinine Ratio 10 9 - 20   Sodium 140 134 - 144 mmol/L   Potassium 3.8 3.5 - 5.2 mmol/L   Chloride 99 96 - 106 mmol/L   CO2 25 20 - 29 mmol/L   Calcium 9.6 8.7 - 10.2 mg/dL   Total Protein 7.1 6.0 - 8.5 g/dL   Albumin 4.3 3.8 - 4.9 g/dL   Globulin, Total 2.8 1.5 - 4.5 g/dL   Albumin/Globulin Ratio 1.5 1.2 - 2.2   Bilirubin Total 0.3 0.0 - 1.2 mg/dL   Alkaline Phosphatase 115 39 - 117 IU/L   AST 55 (H) 0 - 40 IU/L   ALT 77 (H) 0 - 44 IU/L  Lipid Panel w/o Chol/HDL Ratio  Result Value Ref Range   Cholesterol, Total 179 100 - 199 mg/dL   Triglycerides 250  (H) 0 - 149 mg/dL   HDL 29 (L) >39 mg/dL   VLDL Cholesterol Cal 50 (H) 5 - 40 mg/dL   LDL Calculated 100 (H) 0 - 99 mg/dL  Microalbumin, Urine Waived  Result Value Ref Range   Microalb, Ur Waived 80 (H) 0 - 19 mg/L   Creatinine, Urine Waived 300 10 - 300 mg/dL   Microalb/Creat Ratio 30-300 (H) <30 mg/g      Assessment & Plan:   Problem List Items Addressed This Visit    None    Visit Diagnoses    COPD exacerbation (HCC)    -  Primary   Resolved. Continues with scattered wheezes- will start albuterol. Call with any concerns. Follow up as scheduled.   Relevant Medications   albuterol (PROVENTIL HFA;VENTOLIN HFA) 108 (90 Base) MCG/ACT inhaler       Follow up plan: Return if symptoms worsen or fail to improve.

## 2018-05-06 ENCOUNTER — Other Ambulatory Visit: Payer: Self-pay | Admitting: Family Medicine

## 2018-05-06 NOTE — Telephone Encounter (Signed)
Requested Prescriptions  Pending Prescriptions Disp Refills  . metoprolol succinate (TOPROL-XL) 100 MG 24 hr tablet [Pharmacy Med Name: METOPROLOL SUCCINATE ER 100 MG TAB] 90 tablet 0    Sig: TAKE 1 TABLET BY MOUTH AT BEDTIME WITH THE 25 MG TO EQUAL 125 MG ONCEDAILY     Cardiovascular:  Beta Blockers Failed - 05/06/2018  2:32 PM      Failed - Last BP in normal range    BP Readings from Last 1 Encounters:  05/01/18 (!) 144/104         Passed - Last Heart Rate in normal range    Pulse Readings from Last 1 Encounters:  05/01/18 79         Passed - Valid encounter within last 6 months    Recent Outpatient Visits          5 days ago COPD exacerbation ([place])   [facility] [place], [name], DO   2 weeks ago COPD exacerbation ([place])   [place], [name], DO   4 weeks ago Type 2 diabetes mellitus with stage 1 chronic kidney disease, without long-term current use of insulin ([place])   [place], [name], DO   3 months ago Type 2 diabetes mellitus with stage 1 chronic kidney disease, without long-term current use of insulin ([place])   [place], [name], DO   4 months ago Type 2 diabetes mellitus with stage 1 chronic kidney disease, without long-term current use of insulin ([place])   [place], [place], DO      Future Appointments            In 2 months Johnson, Barb Merino, DO [place], [place]   In 3 months  [name]

## 2018-06-20 ENCOUNTER — Other Ambulatory Visit: Payer: Self-pay | Admitting: Family Medicine

## 2018-06-26 ENCOUNTER — Ambulatory Visit: Payer: Medicare Other | Admitting: Family Medicine

## 2018-07-09 ENCOUNTER — Other Ambulatory Visit: Payer: Self-pay

## 2018-07-09 NOTE — Patient Outreach (Signed)
[facility]) Care Management  07/09/2018  DELANDO SATTER 10/08/1960 [id number]   Medication Adherence call to Mr. Ignatz Deis [facility] Ident[facility] Verify spoke with patient he is due on Metformin Er 500 mg and Atorvastatin 10 mg patient explain he is only taking Metformin on a sliding scale he has only been taking 3 tablets daily and sometimes 4 tablets daily and on Atorvastatin he is still taking 1 tablet daily and will pick up from the pharmacy today. Mr. Pine is showing past due under [place].   [facility] Direct Dial [phone]  Fax [phone] [email or web address]

## 2018-07-20 ENCOUNTER — Encounter: Payer: Self-pay | Admitting: Family Medicine

## 2018-07-20 ENCOUNTER — Other Ambulatory Visit: Payer: Self-pay

## 2018-07-20 ENCOUNTER — Ambulatory Visit (INDEPENDENT_AMBULATORY_CARE_PROVIDER_SITE_OTHER): Payer: Medicare Other | Admitting: Family Medicine

## 2018-07-20 VITALS — BP 153/99 | HR 84 | Temp 97.7°F | Ht 70.0 in | Wt 232.0 lb

## 2018-07-20 DIAGNOSIS — E1122 Type 2 diabetes mellitus with diabetic chronic kidney disease: Secondary | ICD-10-CM

## 2018-07-20 DIAGNOSIS — E785 Hyperlipidemia, unspecified: Secondary | ICD-10-CM | POA: Diagnosis not present

## 2018-07-20 DIAGNOSIS — R519 Headache, unspecified: Secondary | ICD-10-CM

## 2018-07-20 DIAGNOSIS — R51 Headache: Secondary | ICD-10-CM | POA: Diagnosis not present

## 2018-07-20 DIAGNOSIS — E1169 Type 2 diabetes mellitus with other specified complication: Secondary | ICD-10-CM | POA: Insufficient documentation

## 2018-07-20 DIAGNOSIS — I129 Hypertensive chronic kidney disease with stage 1 through stage 4 chronic kidney disease, or unspecified chronic kidney disease: Secondary | ICD-10-CM

## 2018-07-20 DIAGNOSIS — G43009 Migraine without aura, not intractable, without status migrainosus: Secondary | ICD-10-CM

## 2018-07-20 DIAGNOSIS — N181 Chronic kidney disease, stage 1: Secondary | ICD-10-CM

## 2018-07-20 LAB — BAYER DCA HB A1C WAIVED: HB A1C (BAYER DCA - WAIVED): 6.2 % (ref <7.0)

## 2018-07-20 MED ORDER — TRIAMCINOLONE ACETONIDE 40 MG/ML IJ SUSP
40.0000 mg | Freq: Once | INTRAMUSCULAR | Status: AC
  Administered 2018-07-20: 40 mg via INTRAMUSCULAR

## 2018-07-20 NOTE — Assessment & Plan Note (Signed)
Doing well with A1c of 6.2- continue current regimen. Continue to monitor. Call with any concerns.

## 2018-07-20 NOTE — Assessment & Plan Note (Signed)
Slightly elevated today, but doing well at home. Continue current regimen. Continue to monitor. Call with any concerns.

## 2018-07-20 NOTE — Assessment & Plan Note (Signed)
Will give kenalog shot today given aspirin allergy. Follow up with pain management as needed. Call with any concerns.

## 2018-07-20 NOTE — Assessment & Plan Note (Signed)
Rechecking levels today. Await results. Call with any concerns.  

## 2018-07-20 NOTE — Progress Notes (Signed)
BP (!) 153/99   Pulse 84   Temp 97.7 F (36.5 C) (Oral)   Ht 5\' 10"  (1.778 m)   Wt 232 lb (105.2 kg)   SpO2 97%   BMI 33.29 kg/m    Subjective:    Patient ID: Kristopher [name], male    DOB: Dec 03, 1960, 58 y.o.   MRN: [id number]  HPI: Kristopher [name] is a 58 y.o. male  Chief Complaint  Patient presents with  . Diabetes    f/u  . Hypertension   DIABETES Hypoglycemic episodes:no Polydipsia/polyuria: no Visual disturbance: no Chest pain: no Paresthesias: no Glucose Monitoring: yes  Accucheck frequency: Daily Taking Insulin?: no Blood Pressure Monitoring: not checking Retinal Examination: Up to Date Foot Exam: Up to Date Diabetic Education: Completed Pneumovax: Up to Date Influenza: Up to Date Aspirin: no  HYPERTENSION / [place] Satisfied with current treatment? yes Duration of hypertension: chronic BP monitoring frequency: a few times a week BP range: 130s/90s BP medication side effects: no Past BP meds: metoprolol, hydralazine Duration of hyperlipidemia: chronic Cholesterol medication side effects: no Cholesterol supplements: none Past cholesterol medications: atorvastain (lipitor) Medication compliance: excellent compliance Aspirin: no Recent stressors: yes Recurrent headaches: no Visual changes: no Palpitations: no Dyspnea: no Chest pain: no Lower extremity edema: no Dizzy/lightheaded: no  Has had a headache for a week and a half. Severe on the R side. No nausea, no vomiting, No visual changes. Nothing is making it better or worse. No other concerns or complaints at this time.   Relevant past medical, surgical, family and social history reviewed and updated as indicated. Interim medical history since our last visit reviewed. Allergies and medications reviewed and updated.  Review of Systems  Constitutional: Negative.   HENT: Negative.   Respiratory: Negative.   Cardiovascular: Negative.   Musculoskeletal: Positive for arthralgias, back  pain, myalgias, neck pain and neck stiffness. Negative for gait problem and joint swelling.  Skin: Negative.   Neurological: Positive for headaches. Negative for dizziness, tremors, seizures, syncope, facial asymmetry, speech difficulty, weakness, light-headedness and numbness.  Psychiatric/Behavioral: Negative.     Per HPI unless specifically indicated above     Objective:    BP (!) 153/99   Pulse 84   Temp 97.7 F (36.5 C) (Oral)   Ht 5\' 10"  (1.778 m)   Wt 232 lb (105.2 kg)   SpO2 97%   BMI 33.29 kg/m   Wt Readings from Last 3 Encounters:  07/20/18 232 lb (105.2 kg)  05/01/18 230 lb (104.3 kg)  04/22/18 226 lb (102.5 kg)    Physical Exam Vitals signs and nursing note reviewed.  Constitutional:      General: He is not in acute distress.    Appearance: Normal appearance. He is not ill-appearing, toxic-appearing or diaphoretic.  HENT:     Head: Normocephalic and atraumatic.     Right Ear: External ear normal.     Left Ear: External ear normal.     Nose: Nose normal.     Mouth/Throat:     Mouth: Mucous membranes are moist.     Pharynx: Oropharynx is clear.  Eyes:     General: No scleral icterus.       Right eye: No discharge.        Left eye: No discharge.     Extraocular Movements: Extraocular movements intact.     Conjunctiva/sclera: Conjunctivae normal.     Pupils: Pupils are equal, round, and reactive to light.  Neck:     Musculoskeletal:  Normal range of motion and neck supple.  Cardiovascular:     Rate and Rhythm: Normal rate and regular rhythm.     Pulses: Normal pulses.     Heart sounds: Normal heart sounds. No murmur. No friction rub. No gallop.   Pulmonary:     Effort: Pulmonary effort is normal. No respiratory distress.     Breath sounds: Normal breath sounds. No stridor. No wheezing, rhonchi or rales.  Chest:     Chest wall: No tenderness.  Musculoskeletal: Normal range of motion.  Skin:    General: Skin is warm and dry.     Capillary Refill:  Capillary refill takes less than 2 seconds.     Coloration: Skin is not jaundiced or pale.     Findings: No bruising, erythema, lesion or rash.  Neurological:     General: No focal deficit present.     Mental Status: He is alert and oriented to person, place, and time. Mental status is at baseline.  Psychiatric:        Mood and Affect: Mood normal.        Behavior: Behavior normal.        Thought Content: Thought content normal.        Judgment: Judgment normal.     Results for orders placed or performed in visit on 04/08/18  Bayer DCA Hb A1c Waived  Result Value Ref Range   HB A1C (BAYER DCA - WAIVED) 7.6 (H) <7.0 %  Comprehensive metabolic panel  Result Value Ref Range   Glucose 153 (H) 65 - 99 mg/dL   BUN 8 6 - 24 mg/dL   Creatinine, Ser 0.82 0.76 - 1.27 mg/dL   GFR calc non Af Amer 99 >59 mL/min/1.73   GFR calc Af Amer 114 >59 mL/min/1.73   BUN/Creatinine Ratio 10 9 - 20   Sodium 140 134 - 144 mmol/L   Potassium 3.8 3.5 - 5.2 mmol/L   Chloride 99 96 - 106 mmol/L   CO2 25 20 - 29 mmol/L   Calcium 9.6 8.7 - 10.2 mg/dL   Total Protein 7.1 6.0 - 8.5 g/dL   Albumin 4.3 3.8 - 4.9 g/dL   Globulin, Total 2.8 1.5 - 4.5 g/dL   Albumin/Globulin Ratio 1.5 1.2 - 2.2   Bilirubin Total 0.3 0.0 - 1.2 mg/dL   Alkaline Phosphatase 115 39 - 117 IU/L   AST 55 (H) 0 - 40 IU/L   ALT 77 (H) 0 - 44 IU/L  Lipid Panel w/o Chol/HDL Ratio  Result Value Ref Range   Cholesterol, Total 179 100 - 199 mg/dL   Triglycerides 250 (H) 0 - 149 mg/dL   HDL 29 (L) >39 mg/dL   VLDL Cholesterol Cal 50 (H) 5 - 40 mg/dL   LDL Calculated 100 (H) 0 - 99 mg/dL  Microalbumin, Urine Waived  Result Value Ref Range   Microalb, Ur Waived 80 (H) 0 - 19 mg/L   Creatinine, Urine Waived 300 10 - 300 mg/dL   Microalb/Creat Ratio 30-300 (H) <30 mg/g      Assessment & Plan:   Problem List Items Addressed This Visit      Cardiovascular and Mediastinum   Migraine    Will give kenalog shot today given aspirin  allergy. Follow up with pain management as needed. Call with any concerns.         Endocrine   Type 2 diabetes mellitus with stage 1 chronic kidney disease, without long-term current use of insulin ([place]) - Primary  Doing well with A1c of 6.2- continue current regimen. Continue to monitor. Call with any concerns.       Relevant Orders   Bayer DCA Hb A1c Waived   Comprehensive metabolic panel   Hyperlipidemia associated with type 2 diabetes mellitus ([place])    Rechecking levels today. Await results. Call with any concerns.       Relevant Orders   Comprehensive metabolic panel   Lipid Panel w/o Chol/HDL Ratio     Genitourinary   Benign hypertensive renal disease    Slightly elevated today, but doing well at home. Continue current regimen. Continue to monitor. Call with any concerns.       Relevant Orders   Comprehensive metabolic panel    Other Visit Diagnoses    Acute nonintractable headache, unspecified headache type       Relevant Medications   triamcinolone acetonide (KENALOG-40) injection 40 mg       Follow up plan: Return in about 3 months (around 10/20/2018).

## 2018-07-21 ENCOUNTER — Ambulatory Visit: Payer: Medicare Other | Attending: Nurse Practitioner | Admitting: Nurse Practitioner

## 2018-07-21 ENCOUNTER — Other Ambulatory Visit: Payer: Self-pay | Admitting: Pain Medicine

## 2018-07-21 ENCOUNTER — Telehealth: Payer: Self-pay

## 2018-07-21 DIAGNOSIS — M25552 Pain in left hip: Secondary | ICD-10-CM | POA: Diagnosis not present

## 2018-07-21 DIAGNOSIS — G894 Chronic pain syndrome: Secondary | ICD-10-CM

## 2018-07-21 DIAGNOSIS — M5416 Radiculopathy, lumbar region: Secondary | ICD-10-CM | POA: Diagnosis not present

## 2018-07-21 DIAGNOSIS — G8929 Other chronic pain: Secondary | ICD-10-CM

## 2018-07-21 DIAGNOSIS — M7918 Myalgia, other site: Secondary | ICD-10-CM | POA: Diagnosis not present

## 2018-07-21 DIAGNOSIS — M47816 Spondylosis without myelopathy or radiculopathy, lumbar region: Secondary | ICD-10-CM

## 2018-07-21 LAB — COMPREHENSIVE METABOLIC PANEL
ALT: 48 IU/L — ABNORMAL HIGH (ref 0–44)
AST: 39 IU/L (ref 0–40)
Albumin/Globulin Ratio: 1.9 (ref 1.2–2.2)
Albumin: 4.6 g/dL (ref 3.8–4.9)
Alkaline Phosphatase: 111 IU/L (ref 39–117)
BUN/Creatinine Ratio: 13 (ref 9–20)
BUN: 12 mg/dL (ref 6–24)
Bilirubin Total: 0.5 mg/dL (ref 0.0–1.2)
CO2: 26 mmol/L (ref 20–29)
Calcium: 9.8 mg/dL (ref 8.7–10.2)
Chloride: 102 mmol/L (ref 96–106)
Creatinine, Ser: 0.94 mg/dL (ref 0.76–1.27)
GFR calc Af Amer: 104 mL/min/{1.73_m2} (ref >59)
GFR calc non Af Amer: 90 mL/min/{1.73_m2} (ref >59)
Globulin, Total: 2.4 g/dL (ref 1.5–4.5)
Glucose: 121 mg/dL — ABNORMAL HIGH (ref 65–99)
Potassium: 3.6 mmol/L (ref 3.5–5.2)
Sodium: 145 mmol/L — ABNORMAL HIGH (ref 134–144)
Total Protein: 7 g/dL (ref 6.0–8.5)

## 2018-07-21 LAB — LIPID PANEL W/O CHOL/HDL RATIO
Cholesterol, Total: 125 mg/dL (ref 100–199)
HDL: 34 mg/dL — ABNORMAL LOW (ref >39)
LDL Calculated: 60 mg/dL (ref 0–99)
Triglycerides: 154 mg/dL — ABNORMAL HIGH (ref 0–149)
VLDL Cholesterol Cal: 31 mg/dL (ref 5–40)

## 2018-07-21 MED ORDER — OXYCODONE HCL 10 MG PO TABS: 10.0000 mg | ORAL_TABLET | Freq: Four times a day (QID) | ORAL | 0 refills | Status: DC | PRN

## 2018-07-21 MED ORDER — NONFORMULARY OR COMPOUNDED ITEM: 2 refills | Status: DC

## 2018-07-21 MED ORDER — NONFORMULARY OR COMPOUNDED ITEM: 2 refills | Status: AC

## 2018-07-21 NOTE — Progress Notes (Signed)
Pain Management Virtual Encounter Note - Virtual Visit via Telephone Telehealth (real-time audio visits between healthcare provider and patient).  Patient's Phone No. & Preferred Pharmacy:  [phone] (home); [phone] (mobile); (Preferred) [phone] No e-mail address on record  [id number] - [place], [address] [address] Phone: [phone] Fax: [phone]  [place], [place] - [address] [address] Phone: [phone] Fax: [phone]   Pre-screening note:  Our staff contacted Mr. More and offered him an "in person", "face-to-face" appointment versus a telephone encounter. He indicated preferring the telephone encounter, at this time.  Reason for Virtual Visit: COVID-19*  Social distancing based on CDC and AMA recommendations.   I contacted Kristopher [name] on 07/21/2018 at 8:27 AM via telephone.      I clearly identified myself as Dionisio David, NP. I verified that I was speaking with the correct person using two identifiers (Name and date of birth: November 08, 1960).  Advanced Informed Consent I sought verbal advanced consent from Kristopher [name] for virtual visit interactions. I informed Mr. Wieck of possible security and privacy concerns, risks, and limitations associated with providing "not-in-person" medical evaluation and management services. I also informed Mr. Mcguire of the availability of "in-person" appointments. Finally, I informed him that there would be a charge for the virtual visit and that he could be  personally, fully or partially, financially responsible for it. Mr. Loeffelholz expressed understanding and agreed to proceed.   Historic Elements   Mr. ABDULRAHEEM PINEO is a 58 y.o. year old, male patient evaluated today after his last encounter by our practice on 04/22/2018. Mr. Scarfo  has a past medical history of Allergy, Arthritis, Benign hypertensive kidney  disease, Chronic back pain, Diabetes mellitus, type 2 ([place]), Hypertension, and Migraines. He also  has a past surgical history that includes Back surgery; Appendectomy; Leg Surgery; Knee surgery (Left); Foot surgery (Left); Colonoscopy with propofol (N/A, 02/19/2016); polypectomy (N/A, 02/19/2016); Total hip arthroplasty (Left, 12/02/2017); Colonoscopy with propofol (N/A, 01/19/2018); polypectomy (01/19/2018); DG OPERATIVE LEFT HIP ([place] HX); and Joint replacement (Left, 12/2017). Mr. Nocito has a current medication list which includes the following prescription(s): accu-chek guide, albuterol, atorvastatin, benzonatate, diphenhydramine, esomeprazole, hydralazine, metformin, metoprolol succinate, metoprolol succinate, NONFORMULARY OR COMPOUNDED ITEM, oxycodone hcl, oxycodone hcl, oxycodone hcl, potassium chloride, and sumatriptan. He  reports that he has been smoking cigarettes. He has a 35.00 pack-year smoking history. He has never used smokeless tobacco. He reports current drug use. Drug: Hydromorphone. He reports that he does not drink alcohol. Mr. Sheerin is allergic to aspirin; epinephrine; lidocaine; novocain [procaine]; penicillins; strawberry extract; and shellfish allergy.   HPI  I last communicated with him on 04/22/2018. Today, he is being contacted for medication management. He rates his lower back pain a 5/10. He has left leg pain into the knee. The pain goes down the side of his leg. He has tingling with occasional numbness. He has occasional weakness. He denies any injuries. He denies any new concerns. He denies any changes in the pain it just varies.    Pharmacotherapy Assessment  Analgesic:Oxycodone IR 10 mg every 6 hours (40 mg/day of oxycodone) MME/day:60 mg/day   Monitoring: Pharmacotherapy: No side-effects or adverse reactions reported. [place] PMP: PDMP reviewed during this encounter.          Compliance: No problems identified or detected. Plan: Refer to "POC".  Review of recent tests   DG HIP UNILAT W  OR W/O PELVIS 2-3 VIEWS LEFT CLINICAL DATA:  Post LEFT hip arthroplasty  EXAM: DG HIP (WITH OR WITHOUT PELVIS) 2-3V LEFT  COMPARISON:  Portable exam at 1226 hrs compared to 08/14/2017  FINDINGS: Interval placement of a LEFT hip prosthesis.  Faint linear lucency traverses the LEFT superior pubic ramus, potentially artifact though a nondisplaced fracture is not completely excluded.  No additional fracture, dislocation, or bone destruction.  Prior L5-S1 fusion.  IMPRESSION: New LEFT hip prosthesis without dislocation.  Faint linear lucency traverses the LEFT superior pubic ramus, potentially an artifact though a nondisplaced fracture is not completely excluded; follow-up dedicated nonportable pelvic radiograph recommended to evaluate.  Electronically Signed   By: Lavonia Dana M.D.   On: 12/02/2017 12:54 DG HIP OPERATIVE UNILAT W OR W/O PELVIS LEFT CLINICAL DATA:  58 year old male with left hip replacement.  EXAM: OPERATIVE 2 HIP (WITH PELVIS IF PERFORMED) left hip VIEWS  8.0 mGy.  TECHNIQUE: Fluoroscopic spot image(s) were submitted for interpretation post-operatively.  COMPARISON:  08/14/2017.  FINDINGS: Post total left hip replacement. This appears in satisfactory position without complication noted on limited C-arm imaging.  IMPRESSION: Post total left hip replacement.  Electronically Signed   By: Genia Del M.D.   On: 12/02/2017 12:06   Office Visit on 07/20/2018  Component Date Value Ref Range Status  . HB A1C ([facility]YER DCA - WAIVED) 07/20/2018 6.2  <7.0 % Final   Comment:                                       Diabetic Adult            <7.0                                       Healthy Adult        4.3 - 5.7                                                           (DCCT/NGSP) American Diabetes Association's Summary of Glycemic Recommendations for Adults with Diabetes: Hemoglobin A1c <7.0%. More stringent glycemic goals (A1c <6.0%) may  further reduce complications at the cost of increased risk of hypoglycemia.   . Glucose 07/20/2018 121* 65 - 99 mg/dL Final  . BUN 07/20/2018 12  6 - 24 mg/dL Final  . Creatinine, Ser 07/20/2018 0.94  0.76 - 1.27 mg/dL Final  . GFR calc non Af Amer 07/20/2018 90  >59 mL/min/1.73 Final  . GFR calc Af Amer 07/20/2018 104  >59 mL/min/1.73 Final  . BUN/Creatinine Ratio 07/20/2018 13  9 - 20 Final  . Sodium 07/20/2018 145* 134 - 144 mmol/L Final  . Potassium 07/20/2018 3.6  3.5 - 5.2 mmol/L Final  . Chloride 07/20/2018 102  96 - 106 mmol/L Final  . CO2 07/20/2018 26  20 - 29 mmol/L Final  . Calcium 07/20/2018 9.8  8.7 - 10.2 mg/dL Final  . Total Protein 07/20/2018 7.0  6.0 - 8.5 g/dL Final  . Albumin 07/20/2018 4.6  3.8 - 4.9 g/dL Final  . Globulin, Total 07/20/2018 2.4  1.5 - 4.5 g/dL Final  .  Albumin/Globulin Ratio 07/20/2018 1.9  1.2 - 2.2 Final  . Bilirubin Total 07/20/2018 0.5  0.0 - 1.2 mg/dL Final  . Alkaline Phosphatase 07/20/2018 111  39 - 117 IU/L Final  . AST 07/20/2018 39  0 - 40 IU/L Final  . ALT 07/20/2018 48* 0 - 44 IU/L Final  . Cholesterol, Total 07/20/2018 125  100 - 199 mg/dL Final  . Triglycerides 07/20/2018 154* 0 - 149 mg/dL Final  . HDL 07/20/2018 34* >39 mg/dL Final  . VLDL Cholesterol Cal 07/20/2018 31  5 - 40 mg/dL Final  . LDL Calculated 07/20/2018 60  0 - 99 mg/dL Final   Assessment  The primary encounter diagnosis was Lumbar spondylosis. Diagnoses of Chronic pain syndrome, Chronic lumbar radicular pain (Location of Secondary source of pain) (S1 dermatomal) (Left), Musculoskeletal pain, chronic, and Chronic hip pain (Left) were also pertinent to this visit.  Plan of Care  I am having Dajon A. Alcala maintain his esomeprazole, potassium chloride, diphenhydrAMINE, SUMAtriptan, metoprolol succinate, hydrALAZINE, atorvastatin, benzonatate, albuterol, metoprolol succinate, metFORMIN, Accu-Chek Guide, Oxycodone HCl, NONFORMULARY OR COMPOUNDED ITEM, Oxycodone HCl,  and Oxycodone HCl.  Pharmacotherapy (Medications Ordered): Meds ordered this encounter  Medications  . Oxycodone HCl 10 MG TABS    Sig: Take 1 tablet (10 mg total) by mouth every 6 (six) hours as needed for up to 30 days.    Dispense:  120 tablet    Refill:  0    Do not place this medication, or any other prescription from our practice, on "Automatic Refill". Patient may have prescription filled one day early if pharmacy is closed on scheduled refill date.    Order Specific Question:   Supervising Provider    Answer:   [name] [phone]  . NONFORMULARY OR COMPOUNDED ITEM    Sig: 10% Ketamine/2% Cyclobenzaprine/6% Gabapentin Cream Sig: 1-2 ml to affected area 3-4 times/day. Amount: 240 GM    Dispense:  240 each    Refill:  2    Order Specific Question:   Supervising Provider    AnswerMilinda Pointer [phone]  . Oxycodone HCl 10 MG TABS    Sig: Take 1 tablet (10 mg total) by mouth every 6 (six) hours as needed for up to 30 days.    Dispense:  120 tablet    Refill:  0    Do not place this medication, or any other prescription from our practice, on "Automatic Refill". Patient may have prescription filled one day early if pharmacy is closed on scheduled refill date.    Order Specific Question:   Supervising Provider    Answer:   [name] [phone]  . Oxycodone HCl 10 MG TABS    Sig: Take 1 tablet (10 mg total) by mouth every 6 (six) hours as needed for up to 30 days.    Dispense:  120 tablet    Refill:  0    Do not place this medication, or any other prescription from our practice, on "Automatic Refill". Patient may have prescription filled one day early if pharmacy is closed on scheduled refill date.    Order Specific Question:   Supervising Provider    Answer:   [name] [phone]   Orders:  No orders of the defined types were placed in this encounter.  Follow-up plan:   Return in about 3 months (around 10/21/2018) for MedMgmt.   I discussed the  assessment and treatment plan with the patient. The patient was provided an opportunity to ask questions  and all were answered. The patient agreed with the plan and demonstrated an understanding of the instructions.  Patient advised to call back or seek an in-person evaluation if the symptoms or condition worsens.  Total duration of non-face-to-face encounter: 12 minutes.  Note by: Dionisio David, NP Date: 07/21/2018; Time: 8:27 AM  Disclaimer:  * Given the special circumstances of the COVID-19 pandemic, the federal government has announced that the Office for Civil Rights (OCR) will exercise its enforcement discretion and will not impose penalties on physicians using telehealth in the event of noncompliance with regulatory requirements under the [place] and [place] (HIPAA) in connection with the good faith provision of telehealth during the GNFAO-13 national public health emergency. ([place])

## 2018-07-21 NOTE — Patient Instructions (Signed)
____________________________________________________________________________________________  Medication Rules  Purpose: To inform patients, and their family members, of our rules and regulations.  Applies to: All patients receiving prescriptions (written or electronic).  Pharmacy of record: Pharmacy where electronic prescriptions will be sent. If written prescriptions are taken to a different pharmacy, please inform the nursing staff. The pharmacy listed in the electronic medical record should be the one where you would like electronic prescriptions to be sent.  Electronic prescriptions: In compliance with the [place] Strengthen Opioid Misuse Prevention (STOP) Act of 2017 (Session Law 2017-74/H243), effective March 04, 2018, all controlled substances must be electronically prescribed. Calling prescriptions to the pharmacy will cease to exist.  Prescription refills: Only during scheduled appointments. Applies to all prescriptions.  NOTE: The following applies primarily to controlled substances (Opioid* Pain Medications).   Patient's responsibilities: 1. Pain Pills: Bring all pain pills to every appointment (except for procedure appointments). 2. Pill Bottles: Bring pills in original pharmacy bottle. Always bring the newest bottle. Bring bottle, even if empty. 3. Medication refills: You are responsible for knowing and keeping track of what medications you take and those you need refilled. The day before your appointment: write a list of all prescriptions that need to be refilled. The day of the appointment: give the list to the admitting nurse. Prescriptions will be written only during appointments. No prescriptions will be written on procedure days. If you forget a medication: it will not be "Called in", "Faxed", or "electronically sent". You will need to get another appointment to get these prescribed. No early refills. Do not call asking to have your prescription filled  early. 4. Prescription Accuracy: You are responsible for carefully inspecting your prescriptions before leaving our office. Have the discharge nurse carefully go over each prescription with you, before taking them home. Make sure that your name is accurately spelled, that your address is correct. Check the name and dose of your medication to make sure it is accurate. Check the number of pills, and the written instructions to make sure they are clear and accurate. Make sure that you are given enough medication to last until your next medication refill appointment. 5. Taking Medication: Take medication as prescribed. When it comes to controlled substances, taking less pills or less frequently than prescribed is permitted and encouraged. Never take more pills than instructed. Never take medication more frequently than prescribed.  6. Inform other Doctors: Always inform, all of your healthcare providers, of all the medications you take. 7. Pain Medication from other Providers: You are not allowed to accept any additional pain medication from any other Doctor or Healthcare provider. There are two exceptions to this rule. (see below) In the event that you require additional pain medication, you are responsible for notifying us, as stated below. 8. Medication Agreement: You are responsible for carefully reading and following our Medication Agreement. This must be signed before receiving any prescriptions from our practice. Safely store a copy of your signed Agreement. Violations to the Agreement will result in no further prescriptions. (Additional copies of our Medication Agreement are available upon request.) 9. Laws, Rules, & Regulations: All patients are expected to follow all Federal and State Laws, Statutes, Rules, & Regulations. Ignorance of the Laws does not constitute a valid excuse. The use of any illegal substances is prohibited. 10. Adopted CDC guidelines & recommendations: Target dosing levels will be  at or below 60 MME/day. Use of benzodiazepines** is not recommended.  Exceptions: There are only two exceptions to the rule of not   receiving pain medications from other Healthcare Providers. 1. Exception #1 (Emergencies): In the event of an emergency (i.e.: accident requiring emergency care), you are allowed to receive additional pain medication. However, you are responsible for: As soon as you are able, call our office [phone], at any time of the day or night, and leave a message stating your name, the date and nature of the emergency, and the name and dose of the medication prescribed. In the event that your call is answered by a member of our staff, make sure to document and save the date, time, and the name of the person that took your information.  2. Exception #2 (Planned Surgery): In the event that you are scheduled by another doctor or dentist to have any type of surgery or procedure, you are allowed (for a period no longer than 30 days), to receive additional pain medication, for the acute post-op pain. However, in this case, you are responsible for picking up a copy of our "Post-op Pain Management for Surgeons" handout, and giving it to your surgeon or dentist. This document is available at our office, and does not require an appointment to obtain it. Simply go to our office during business hours (Monday-Thursday from 8:00 AM to 4:00 PM) (Friday 8:00 AM to 12:00 Noon) or if you have a scheduled appointment with us, prior to your surgery, and ask for it by name. In addition, you will need to provide us with your name, name of your surgeon, type of surgery, and date of procedure or surgery.  *Opioid medications include: morphine, codeine, oxycodone, oxymorphone, hydrocodone, hydromorphone, meperidine, tramadol, tapentadol, buprenorphine, fentanyl, methadone. **Benzodiazepine medications include: diazepam (Valium), alprazolam (Xanax), clonazepam (Klonopine), lorazepam (Ativan), clorazepate  (Tranxene), chlordiazepoxide (Librium), estazolam (Prosom), oxazepam (Serax), temazepam (Restoril), triazolam (Halcion) (Last updated: 05/01/2017) ____________________________________________________________________________________________    

## 2018-07-21 NOTE — Telephone Encounter (Signed)
He said that [facility] wanted him to call us and give [place] a fax number where we could fax a script for some kind of cream to them. The fax number is [phone] and it is [facility].

## 2018-07-22 NOTE — Telephone Encounter (Signed)
His Non formulary Rx needs to be faxed over to this numnber. I sent the message on yesterday so that Dr Lowella Dandy could print and sign this.

## 2018-07-28 ENCOUNTER — Telehealth: Payer: Self-pay | Admitting: *Deleted

## 2018-07-28 NOTE — Telephone Encounter (Signed)
Spoke to patient to let him know that I faxed a copy of Rx over to [facility] for him.  He will call and check on this and let me know if there is anything else I need to do.

## 2018-07-28 NOTE — Telephone Encounter (Signed)
Rx for compounding cream faxed to number left by patient.

## 2018-08-12 ENCOUNTER — Ambulatory Visit (INDEPENDENT_AMBULATORY_CARE_PROVIDER_SITE_OTHER): Payer: Medicare Other

## 2018-08-12 VITALS — BP 149/95 | HR 71 | Ht 70.0 in | Wt 224.0 lb

## 2018-08-12 DIAGNOSIS — Z0000 Encounter for general adult medical examination without abnormal findings: Secondary | ICD-10-CM

## 2018-08-12 NOTE — Patient Instructions (Signed)
Kristopher [name] , Thank you for taking time to come for your Medicare Wellness Visit. I appreciate your ongoing commitment to your health goals. Please review the following plan we discussed and let me know if I can assist you in the future.   Screening recommendations/referrals: Colonoscopy: completed 01/19/2018, due 2022 Recommended yearly ophthalmology/optometry visit for glaucoma screening and checkup Recommended yearly dental visit for hygiene and checkup  Vaccinations: Influenza vaccine: declined Pneumococcal vaccine: up to date Tdap vaccine: up to date Shingles vaccine: not eligible  Advanced directives: Please bring a copy of your health care power of attorney and living will to the office at your convenience.  Conditions/risks identified: smoker, discussed lung cancer screening- due 08/2018. Someone should contact you to schedule this.   Next appointment: Follow up in one year for your annual wellness exam.   Preventive Care 40-64 Years, Male Preventive care refers to lifestyle choices and visits with your health care provider that can promote health and wellness. What does preventive care include?  A yearly physical exam. This is also called an annual well check.  Dental exams once or twice a year.  Routine eye exams. Ask your health care provider how often you should have your eyes checked.  Personal lifestyle choices, including:  Daily care of your teeth and gums.  Regular physical activity.  Eating a healthy diet.  Avoiding tobacco and drug use.  Limiting alcohol use.  Practicing safe sex.  Taking low-dose aspirin every day starting at age 59. What happens during an annual well check? The services and screenings done by your health care provider during your annual well check will depend on your age, overall health, lifestyle risk factors, and family history of disease. Counseling  Your health care provider may ask you questions about your:  Alcohol use.   Tobacco use.  Drug use.  Emotional well-being.  Home and relationship well-being.  Sexual activity.  Eating habits.  Work and work Statistician. Screening  You may have the following tests or measurements:  Height, weight, and BMI.  Blood pressure.  Lipid and cholesterol levels. These may be checked every 5 years, or more frequently if you are over 44 years old.  Skin check.  Lung cancer screening. You may have this screening every year starting at age 43 if you have a 30-pack-year history of smoking and currently smoke or have quit within the past 15 years.  Fecal occult blood test (FOBT) of the stool. You may have this test every year starting at age 37.  Flexible sigmoidoscopy or colonoscopy. You may have a sigmoidoscopy every 5 years or a colonoscopy every 10 years starting at age 8.  Prostate cancer screening. Recommendations will vary depending on your family history and other risks.  Hepatitis C blood test.  Hepatitis B blood test.  Sexually transmitted disease (STD) testing.  Diabetes screening. This is done by checking your blood sugar (glucose) after you have not eaten for a while (fasting). You may have this done every 1-3 years. Discuss your test results, treatment options, and if necessary, the need for more tests with your health care provider. Vaccines  Your health care provider may recommend certain vaccines, such as:  Influenza vaccine. This is recommended every year.  Tetanus, diphtheria, and acellular pertussis (Tdap, Td) vaccine. You may need a Td booster every 10 years.  Zoster vaccine. You may need this after age 37.  Pneumococcal 13-valent conjugate (PCV13) vaccine. You may need this if you have certain conditions and have not  been vaccinated.  Pneumococcal polysaccharide (PPSV23) vaccine. You may need one or two doses if you smoke cigarettes or if you have certain conditions. Talk to your health care provider about which screenings and  vaccines you need and how often you need them. This information is not intended to replace advice given to you by your health care provider. Make sure you discuss any questions you have with your health care provider. Document Released: 03/17/2015 Document Revised: 11/08/2015 Document Reviewed: 12/20/2014 Elsevier Interactive Patient Education  2017 [place] Prevention in the Home Falls can cause injuries. They can happen to people of all ages. There are many things you can do to make your home safe and to help prevent falls. What can I do on the outside of my home?  Regularly fix the edges of walkways and driveways and fix any cracks.  Remove anything that might make you trip as you walk through a door, such as a raised step or threshold.  Trim any bushes or trees on the path to your home.  Use bright outdoor lighting.  Clear any walking paths of anything that might make someone trip, such as rocks or tools.  Regularly check to see if handrails are loose or broken. Make sure that both sides of any steps have handrails.  Any raised decks and porches should have guardrails on the edges.  Have any leaves, snow, or ice cleared regularly.  Use sand or salt on walking paths during winter.  Clean up any spills in your garage right away. This includes oil or grease spills. What can I do in the bathroom?  Use night lights.  Install grab bars by the toilet and in the tub and shower. Do not use towel bars as grab bars.  Use non-skid mats or decals in the tub or shower.  If you need to sit down in the shower, use a plastic, non-slip stool.  Keep the floor dry. Clean up any water that spills on the floor as soon as it happens.  Remove soap buildup in the tub or shower regularly.  Attach bath mats securely with double-sided non-slip rug tape.  Do not have throw rugs and other things on the floor that can make you trip. What can I do in the bedroom?  Use night lights.   Make sure that you have a light by your bed that is easy to reach.  Do not use any sheets or blankets that are too big for your bed. They should not hang down onto the floor.  Have a firm chair that has side arms. You can use this for support while you get dressed.  Do not have throw rugs and other things on the floor that can make you trip. What can I do in the kitchen?  Clean up any spills right away.  Avoid walking on wet floors.  Keep items that you use a lot in easy-to-reach places.  If you need to reach something above you, use a strong step stool that has a grab bar.  Keep electrical cords out of the way.  Do not use floor polish or wax that makes floors slippery. If you must use wax, use non-skid floor wax.  Do not have throw rugs and other things on the floor that can make you trip. What can I do with my stairs?  Do not leave any items on the stairs.  Make sure that there are handrails on both sides of the stairs and use them.  Fix handrails that are broken or loose. Make sure that handrails are as long as the stairways.  Check any carpeting to make sure that it is firmly attached to the stairs. Fix any carpet that is loose or worn.  Avoid having throw rugs at the top or bottom of the stairs. If you do have throw rugs, attach them to the floor with carpet tape.  Make sure that you have a light switch at the top of the stairs and the bottom of the stairs. If you do not have them, ask someone to add them for you. What else can I do to help prevent falls?  Wear shoes that:  Do not have high heels.  Have rubber bottoms.  Are comfortable and fit you well.  Are closed at the toe. Do not wear sandals.  If you use a stepladder:  Make sure that it is fully opened. Do not climb a closed stepladder.  Make sure that both sides of the stepladder are locked into place.  Ask someone to hold it for you, if possible.  Clearly mark and make sure that you can see:  Any  grab bars or handrails.  First and last steps.  Where the edge of each step is.  Use tools that help you move around (mobility aids) if they are needed. These include:  Canes.  Walkers.  Scooters.  Crutches.  Turn on the lights when you go into a dark area. Replace any light bulbs as soon as they burn out.  Set up your furniture so you have a clear path. Avoid moving your furniture around.  If any of your floors are uneven, fix them.  If there are any pets around you, be aware of where they are.  Review your medicines with your doctor. Some medicines can make you feel dizzy. This can increase your chance of falling. Ask your doctor what other things that you can do to help prevent falls. This information is not intended to replace advice given to you by your health care provider. Make sure you discuss any questions you have with your health care provider. Document Released: 12/15/2008 Document Revised: 07/27/2015 Document Reviewed: 03/25/2014 Elsevier Interactive Patient Education  2017 [employer].

## 2018-08-12 NOTE — Progress Notes (Signed)
Subjective:   Kristopher [name] is a 58 y.o. male who presents for Medicare Annual/Subsequent preventive examination.  This visit is being conducted via phone call  - after an attmept to do on video chat - due to the COVID-19 pandemic. This patient has given me verbal consent via phone to conduct this visit, patient states they are participating from their home address. Some vital signs may be absent or patient reported.   Patient identification: identified by name, DOB, and current address.    Review of Systems:  Cardiac Risk Factors include: advanced age (>53men, >89 women);hypertension;diabetes mellitus;dyslipidemia;male gender;obesity (BMI >30kg/m2);smoking/ tobacco exposure     Objective:    Vitals: BP (!) 149/95 Comment: patient reported  Pulse 71 Comment: patient reported  Ht 5\' 10"  (1.778 m) Comment: patient reported  Wt 224 lb (101.6 kg) Comment: patient reported  BMI 32.14 kg/m   Body mass index is 32.14 kg/m.  Advanced Directives 08/12/2018 01/20/2018 01/19/2018 12/02/2017 12/02/2017 10/21/2017 10/21/2017  Does Patient Have a Medical Advance Directive? Yes Yes No No No Yes Yes  Type of Paramedic of [place];Living will Living will;Healthcare Power of [place];Living will [place];Living will  Does patient want to make changes to medical advance directive? - No - Patient declined - - - No - Patient declined -  Copy of [place] in Chart? No - copy requested - - - - No - copy requested Yes  Would patient like information on creating a medical advance directive? - - Yes (MAU/Ambulatory/Procedural Areas - Information given) No - Patient declined No - Patient declined - -    Tobacco Social History   Tobacco Use  Smoking Status Current Every Day Smoker  . Packs/day: 1.00  . Years: 35.00  . Pack years: 35.00  . Types: Cigarettes  Smokeless Tobacco Never Used     Ready to quit:  No Counseling given: Yes   Clinical Intake:                 Nutrition Risk Assessment:  Has the patient had any N/V/D within the last 2 months?  No  Does the patient have any non-healing wounds?  No  Has the patient had any unintentional weight loss or weight gain?  No   Diabetes:  Is the patient diabetic?  Yes  If diabetic, was a CBG obtained today?  No checked at home 113 Did the patient bring in their glucometer from home?  n/a How often do you monitor your CBG's? Daily and as needed  Financial Strains and Diabetes Management:  Are you having any financial strains with the device, your supplies or your medication? No .  Does the patient want to be seen by Chronic Care Management for management of their diabetes?  No  Would the patient like to be referred to a Nutritionist or for Diabetic Management?  No   Diabetic Exams:  Diabetic Eye Exam: Completed 02/17/2018.   Diabetic Foot Exam: Completed 12/30/2017.        Past Medical History:  Diagnosis Date  . Allergy   . Arthritis    left foot  . Benign hypertensive kidney disease   . Chronic back pain    Four rods in back  . Diabetes mellitus, type 2 ([place])   . Hypertension   . Migraines    daily   Past Surgical History:  Procedure Laterality Date  . APPENDECTOMY    .  BACK SURGERY    . COLONOSCOPY WITH PROPOFOL N/A 02/19/2016   Procedure: COLONOSCOPY WITH PROPOFOL;  Surgeon: Lucilla Lame, MD;  Location: [facility];  Service: Endoscopy;  Laterality: N/A;  . COLONOSCOPY WITH PROPOFOL N/A 01/19/2018   Procedure: COLONOSCOPY WITH PROPOFOL;  Surgeon: Lucilla Lame, MD;  Location: [facility];  Service: Endoscopy;  Laterality: N/A;  Diabetic - oral meds  . DG OPERATIVE LEFT HIP ([place] HX)     10/19  . FOOT SURGERY Left    Screws and plates  . JOINT REPLACEMENT Left 12/2017   DR Rudene Christians  . KNEE SURGERY Left    X 2  . LEG SURGERY    . POLYPECTOMY N/A 02/19/2016   Procedure: POLYPECTOMY;   Surgeon: Lucilla Lame, MD;  Location: [facility];  Service: Endoscopy;  Laterality: N/A;  . POLYPECTOMY  01/19/2018   Procedure: POLYPECTOMY;  Surgeon: Lucilla Lame, MD;  Location: [facility];  Service: Endoscopy;;  . TOTAL HIP ARTHROPLASTY Left 12/02/2017   Procedure: TOTAL HIP ARTHROPLASTY ANTERIOR APPROACH;  Surgeon: Hessie Knows, MD;  Location: [facility];  Service: Orthopedics;  Laterality: Left;   Family History  Problem Relation Age of Onset  . Cancer Father   . Diabetes Sister   . Thrombosis Sister    Social History   Socioeconomic History  . Marital status: Significant Other    Spouse name: Not on file  . Number of children: Not on file  . Years of education: Not on file  . Highest education level: Not on file  Occupational History  . Occupation: disability  Social Needs  . Financial resource strain: Not hard at all  . Food insecurity:    Worry: Never true    Inability: Never true  . Transportation needs:    Medical: No    Non-medical: No  Tobacco Use  . Smoking status: Current Every Day Smoker    Packs/day: 1.00    Years: 35.00    Pack years: 35.00    Types: Cigarettes  . Smokeless tobacco: Never Used  Substance and Sexual Activity  . Alcohol use: No    Alcohol/week: 0.0 standard drinks  . Drug use: Yes    Types: Oxycodone  . Sexual activity: Yes  Lifestyle  . Physical activity:    Days per week: 0 days    Minutes per session: 0 min  . Stress: Not at all  Relationships  . Social connections:    Talks on phone: More than three times a week    Gets together: More than three times a week    Attends religious service: More than 4 times per year    Active member of club or organization: No    Attends meetings of clubs or organizations: Never    Relationship status: Living with partner  Other Topics Concern  . Not on file  Social History Narrative  . Not on file    Outpatient Encounter Medications as of 08/12/2018  Medication Sig  .  ACCU-CHEK GUIDE test strip USE TO TEST BLOOD SUGAR 2X A DAY  . albuterol (PROVENTIL HFA;VENTOLIN HFA) 108 (90 Base) MCG/ACT inhaler Inhale 2 puffs into the lungs every 6 (six) hours as needed for wheezing or shortness of breath.  [name] atorvastatin (LIPITOR) 10 MG tablet Take 1 tablet (10 mg total) by mouth daily.  . diphenhydrAMINE (BENADRYL) 25 MG tablet Take 50 mg by mouth 2 (two) times daily.   [name] esomeprazole (NEXIUM) 40 MG capsule Take 1 capsule (40  mg total) by mouth daily.  . hydrALAZINE (APRESOLINE) 50 MG tablet Take 1 tablet (50 mg total) by mouth 2 (two) times daily.  . metFORMIN (GLUCOPHAGE-XR) 500 MG 24 hr tablet TAKE 2 TABLETS (1,000 MG TOTAL) BY MOUTH 2 (TWO) TIMES DAILY WITH A MEAL.  . metoprolol succinate (TOPROL-XL) 100 MG 24 hr tablet TAKE 1 TABLET BY MOUTH AT BEDTIME WITH THE 25 MG TO EQUAL 125 MG ONCEDAILY  . metoprolol succinate (TOPROL-XL) 25 MG 24 hr tablet TAKE 1 TABLET (25 mg) BY MOUTH WITH A 100 mg TABLET FOR A TOTAL OF (125 mg) DAILY.  . NONFORMULARY OR COMPOUNDED ITEM 10% Ketamine/2% Cyclobenzaprine/6% Gabapentin Cream Sig: 1-2 ml to affected area 3-4 times/day. Amount: 240 GM  . Oxycodone HCl 10 MG TABS Take 1 tablet (10 mg total) by mouth every 6 (six) hours as needed for up to 30 days.  . potassium chloride (KLOR-CON) 20 MEQ packet Take 20 mEq by mouth daily.  . SUMAtriptan (IMITREX) 100 MG tablet TAKE ONE TABLET AT ONSET OF HEADACHE MAY REPEAT IN TWO HOURS IF NECESSARY LIMIT OF TWO IN 24 HOURS  . [START ON 09/24/2018] Oxycodone HCl 10 MG TABS Take 1 tablet (10 mg total) by mouth every 6 (six) hours as needed for up to 30 days. (Patient not taking: Reported on 08/12/2018)  . [START ON 08/25/2018] Oxycodone HCl 10 MG TABS Take 1 tablet (10 mg total) by mouth every 6 (six) hours as needed for up to 30 days. (Patient not taking: Reported on 08/12/2018)  . [DISCONTINUED] benzonatate (TESSALON) 200 MG capsule Take 1 capsule (200 mg total) by mouth 2 (two) times daily as needed for  cough. (Patient not taking: Reported on 08/12/2018)   No facility-administered encounter medications on file as of 08/12/2018.     Activities of Daily Living In your present state of health, do you have any difficulty performing the following activities: 08/12/2018 01/19/2018  Hearing? N N  Vision? N N  Difficulty concentrating or making decisions? N N  Walking or climbing stairs? N N  Dressing or bathing? N N  Doing errands, shopping? N -  Preparing Food and eating ? N -  Using the Toilet? N -  In the past six months, have you accidently leaked urine? N -  Do you have problems with loss of bowel control? N -  Managing your Medications? N -  Managing your Finances? N -  Housekeeping or managing your Housekeeping? N -  Some recent data might be hidden    Patient Care Team: [name], DO as PCP - General (Family Medicine) Gavin Pound, CMA (Inactive) as Certified Medical Assistant Guadalupe Maple, MD as Attending Physician (Family Medicine)   Assessment:   This is a routine wellness examination for Kristopher [name].  Exercise Activities and Dietary recommendations Exercise limited by: None identified  Goals    . Quit Smoking     Smoking cessation discussed       Fall Risk Fall Risk  08/12/2018 04/22/2018 02/19/2018 02/12/2018 02/05/2018  Falls in the past year? 0 1 1 1 1   Comment - - - no falls since last week's visit Pt reports he fell yesterday.   Number falls in past yr: - 1 1 1 1   Comment - - - - -  Injury with Fall? - 0 0 1 1  Comment - - - - He has hip soreness on the left side.  Risk Factor Category  - - - - -  Risk  for fall due to : - - - - -  Risk for fall due to: Comment - - - - -  Follow up - - - - -   [place]:  Any stairs in or around the home? Yes  If so, are there any without handrails? No   Home free of loose throw rugs in walkways, pet beds, electrical cords, etc? Yes  Adequate lighting in your home to  reduce risk of falls? Yes   ASSISTIVE DEVICES UTILIZED TO PREVENT FALLS:  Life alert? No  Use of a cane, walker or w/c? Yes  has cane and walker if needed Grab bars in the bathroom? No  Shower chair or bench in shower? No  Elevated toilet seat or a handicapped toilet? No    TIMED UP AND GO:  Unable to perform    Depression Screen PHQ 2/9 Scores 08/12/2018 01/20/2018 12/30/2017 10/21/2017  PHQ - 2 Score 0 0 0 0  PHQ- 9 Score - - - -  Exception Documentation - - - -    Cognitive Function     6CIT Screen 08/04/2017 06/24/2016  What Year? 0 points 0 points  What month? 0 points 0 points  What time? 0 points 0 points  Count back from 20 0 points 0 points  Months in reverse 0 points 4 points  Repeat phrase 2 points 4 points  Total Score 2 8    Immunization History  Administered Date(s) Administered  . Pneumococcal Polysaccharide-23 06/24/2016  . Tdap 03/04/2013, 06/03/2017    Qualifies for Shingles Vaccine? Not eligible.   Tdap: up to date  Flu Vaccine: declined   Pneumococcal Vaccine: up to date   Screening Tests Health Maintenance  Topic Date Due  . INFLUENZA VACCINE  10/03/2018  . FOOT EXAM  12/31/2018  . HEMOGLOBIN A1C  01/20/2019  . OPHTHALMOLOGY EXAM  02/18/2019  . COLONOSCOPY  01/19/2021  . TETANUS/TDAP  06/04/2027  . PNEUMOCOCCAL POLYSACCHARIDE VACCINE AGE 39-64 HIGH RISK  Completed  . Hepatitis C Screening  Completed  . HIV Screening  Completed   Cancer Screenings:  Colorectal Screening: Completed 01/19/2018. Repeat every 3 years  Lung Cancer Screening: (Low Dose CT Chest recommended if Age 35-80 years, 30 pack-year currently smoking OR have quit w/in 15years.) does qualify. Completed 08/22/2017   Additional Screening:  Hepatitis C Screening: does qualify; Completed 06/24/2016  Dental Screening: Recommended annual dental exams for proper oral hygiene  Community Resource Referral:  CRR required this visit?  No        Plan:  I have personally  reviewed and addressed the Medicare Annual Wellness questionnaire and have noted the following in the patient's chart:  A. Medical and social history B. Use of alcohol, tobacco or illicit drugs  C. Current medications and supplements D. Functional ability and status E.  Nutritional status F.  Physical activity G. Advance directives H. List of other physicians I.  Hospitalizations, surgeries, and ER visits in previous 12 months J.  [place] such as hearing and vision if needed, cognitive and depression L. Referrals and appointments   In addition, I have reviewed and discussed with patient certain preventive protocols, quality metrics, and best practice recommendations. A written personalized care plan for preventive services as well as general preventive health recommendations were provided to patient.   Signed,   Bevelyn Ngo, LPN  [id number]/[id number] Nurse Health Advisor  Nurse Notes: none

## 2018-08-13 ENCOUNTER — Telehealth: Payer: Self-pay | Admitting: Family Medicine

## 2018-08-13 NOTE — Chronic Care Management (AMB) (Signed)
Chronic Care Management   Note  08/13/2018 Name: Kristopher [name] MRN: [id number] DOB: [birth year]  Kristopher [name] is a 58 y.o. year old male who is a primary care patient of [name], DO. I reached out to [name] by phone today in response to a referral sent by Kristopher [name] [facility] health plan.    [name] was given information about Chronic Care Management services today including:  1. CCM service includes personalized support from designated clinical staff supervised by his physician, including individualized plan of care and coordination with other care providers 2. 24/7 contact phone numbers for assistance for urgent and routine care needs. 3. Service will only be billed when office clinical staff spend 20 minutes or more in a month to coordinate care. 4. Only one practitioner may furnish and bill the service in a calendar month. 5. The patient may stop CCM services at any time (effective at the end of the month) by phone call to the office staff. 6. The patient will be responsible for cost sharing (co-pay) of up to 20% of the service fee (after annual deductible is met).  Patient agreed to services and verbal consent obtained.   Follow up plan: Telephone appointment with CCM team member scheduled for: 08/14/2018  Brayton  ??[email or web address]   ??[phone]

## 2018-08-14 ENCOUNTER — Ambulatory Visit (INDEPENDENT_AMBULATORY_CARE_PROVIDER_SITE_OTHER): Payer: Medicare Other | Admitting: Pharmacist

## 2018-08-14 DIAGNOSIS — R519 Headache, unspecified: Secondary | ICD-10-CM

## 2018-08-14 DIAGNOSIS — N181 Chronic kidney disease, stage 1: Secondary | ICD-10-CM

## 2018-08-14 DIAGNOSIS — E1122 Type 2 diabetes mellitus with diabetic chronic kidney disease: Secondary | ICD-10-CM

## 2018-08-14 NOTE — Patient Instructions (Signed)
Visit Information  Goals Addressed            This Visit's Progress     Patient Stated   . "I don't like taking medications" (pt-stated)       Current Barriers:  . Lack of knowledge about appropriate medication management o Notes that he takes metoprolol XL 125 mg QAM and hydralazine 50 mg QPM; is not taking hydralazine BID as prescribed o Also notes that he is taking 3-6 diphenhydramine tablets daily for "allergies" and "itching from my pain medications" o Confirms that he has started taking metformin XR 1000 mg BID, instead of taking on a sliding scale fashion based on his blood sugars   Pharmacist Clinical Goal(s):  [name] Over the next 30 days, patient will work with PharmD and primary care provider to address needs related to appropriate management of medications  Interventions: . Comprehensive medication review performed. . Educated patient on hydralazine BID dosing. Discussed monitoring of blood pressures and to bring results to upcoming primary care provider appointment . Discussed CNS sedation with such supratherapeutic doses of diphenhydramine, particularly concerning in combination with narcotics. Encouraged to try second generation antihistamine (loratidine, cetirizine, fexofenidine) + fluticasone nasal spray for allergies, instead of diphenhydramine. Patient verbalized understanding . Reiterated dosing of metformin and benefits. Congratulated patient on A1c improvement. Encouraged to stop drinking a 6 pack of sugar beverages BID, discussed long term concerns with uncontrolled blood sugars.   Patient Self Care Activities:  . Calls pharmacy for medication refills . Calls provider office for new concerns or questions  Initial goal documentation        Mr. Hun was given information about Chronic Care Management services today including:  1. CCM service includes personalized support from designated clinical staff supervised by his physician, including individualized plan of  care and coordination with other care providers 2. 24/7 contact phone numbers for assistance for urgent and routine care needs. 3. Service will only be billed when office clinical staff spend 20 minutes or more in a month to coordinate care. 4. Only one practitioner may furnish and bill the service in a calendar month. 5. The patient may stop CCM services at any time (effective at the end of the month) by phone call to the office staff. 6. The patient will be responsible for cost sharing (co-pay) of up to 20% of the service fee (after annual deductible is met).  Patient agreed to services and verbal consent obtained.   The patient verbalized understanding of instructions provided today and declined a print copy of patient instruction materials.   Plan: - Will collaborate with Janci Minor, RN CM regarding knee brace - Will plan to meet with patient face to face at primary care provider visit later this month.   Catie Darnelle Maffucci, PharmD Clinical Pharmacist [place] [phone]

## 2018-08-14 NOTE — Chronic Care Management (AMB) (Signed)
Chronic Care Management   Note  08/14/2018 Name: Kristopher [name] MRN: [id number] DOB: [birth year]   Subjective:  Kristopher [name] is a 58 y.o. year old male who is a primary care patient of [name], DO. The CCM team was consulted for assistance with chronic disease management and care coordination needs.    Received referral from patient's insurance plan due to high risk status. Contacted patient today to discuss medication management and CCM team.   He notes that he feels he needs a larger knee brace, and wonders if someone on the team can help with that.   Review of patient status, including review of consultants reports, laboratory and other test data, was performed as part of comprehensive evaluation and provision of chronic care management services.   Objective:  Lab Results  Component Value Date   CREATININE 0.94 07/20/2018   CREATININE 0.82 04/08/2018   CREATININE 0.96 02/02/2018    Lab Results  Component Value Date   HGBA1C 6.2 07/20/2018       Component Value Date/Time   CHOL 125 07/20/2018 0936   TRIG 154 (H) 07/20/2018 0936   HDL 34 (L) 07/20/2018 0936   CHOLHDL 7.0 08/14/2017 0433   VLDL 52 (H) 08/14/2017 0433   LDLCALC 60 07/20/2018 0936    Clinical ASCVD: No    BP Readings from Last 3 Encounters:  08/12/18 (!) 149/95  07/20/18 (!) 153/99  05/01/18 (!) 144/104    Allergies  Allergen Reactions  . Aspirin Anaphylaxis  . Epinephrine Anaphylaxis  . Lidocaine Anaphylaxis  . Novocain [Procaine] Anaphylaxis  . Penicillins Anaphylaxis    Has patient had a PCN reaction causing immediate rash, facial/tongue/throat swelling, SOB or lightheadedness with hypotension: Yes Has patient had a PCN reaction causing severe rash involving mucus membranes or skin necrosis: No Has patient had a PCN reaction that required hospitalization: Yes Has patient had a PCN reaction occurring within the last 10 years: No If all of the above answers are "NO", then may  proceed with Cephalosporin use.   . Strawberry Extract Anaphylaxis  . Shellfish Allergy Nausea And Vomiting    Medications Reviewed Today    Reviewed by De Hollingshead, [facility] (Pharmacist) on 08/14/18 at 1556  Med List Status: <None>  Medication Order Taking? Sig Documenting Provider Last Dose Status Informant  ACCU-CHEK GUIDE test strip [id number] Yes USE TO TEST BLOOD SUGAR 2X A DAY [provider] Taking Active   albuterol (PROVENTIL HFA;VENTOLIN HFA) 108 (90 Base) MCG/ACT inhaler [id number] No Inhale 2 puffs into the lungs every 6 (six) hours as needed for wheezing or shortness of breath.  Patient not taking: Reported on 08/14/2018   [name], DO Not Taking Active   atorvastatin (LIPITOR) 10 MG tablet [id number] Yes Take 1 tablet (10 mg total) by mouth daily. Johnson, [name], DO Taking Active   diphenhydrAMINE (BENADRYL) 25 MG tablet [id number] Yes Take 50 mg by mouth 2 (two) times daily.  [provider] Taking Active Spouse/Significant Other           Med Note Darnelle Maffucci, Arville Lime   Fri Aug 14, 2018  3:50 PM) 2-3 tablets daily for "itching and allergies  esomeprazole (NEXIUM) 40 MG capsule [id number] Yes Take 1 capsule (40 mg total) by mouth daily. [name], DO Taking Active Spouse/Significant Other           Med Note Darnelle Maffucci, Jebadiah Imperato E   Fri Aug 14, 2018  3:49 PM) QAM, sometimes BID if  acidic supper   hydrALAZINE (APRESOLINE) 50 MG tablet [id number] Yes Take 1 tablet (50 mg total) by mouth 2 (two) times daily. [name], DO Taking Active            Med Note Darnelle Maffucci, Arville Lime   Fri Aug 14, 2018  3:38 PM) Just taking HS   metFORMIN (GLUCOPHAGE-XR) 500 MG 24 hr tablet [id number] Yes TAKE 2 TABLETS (1,000 MG TOTAL) BY MOUTH 2 (TWO) TIMES DAILY WITH A MEAL. Park Liter P, DO Taking Active   metoprolol succinate (TOPROL-XL) 100 MG 24 hr tablet [id number] Yes TAKE 1 TABLET BY MOUTH AT BEDTIME WITH THE 25 MG TO EQUAL 125 MG ONCEDAILY Johnson, [name],  DO Taking Active            Med Note Darnelle Maffucci, Arville Lime   Fri Aug 14, 2018  3:37 PM)    metoprolol succinate (TOPROL-XL) 25 MG 24 hr tablet [id number] Yes TAKE 1 TABLET (25 mg) BY MOUTH WITH A 100 mg TABLET FOR A TOTAL OF (125 mg) DAILY. [name], DO Taking Active   NONFORMULARY OR COMPOUNDED ITEM [id number] Yes 10% Ketamine/2% Cyclobenzaprine/6% Gabapentin Cream Sig: 1-2 ml to affected area 3-4 times/day. Amount: 240 GM [name], MD Taking Active   Oxycodone HCl 10 MG TABS [id number] No Take 1 tablet (10 mg total) by mouth every 6 (six) hours as needed for up to 30 days.  Patient not taking: Reported on 08/12/2018   Vevelyn Francois, NP Not Taking Active   Oxycodone HCl 10 MG TABS [id number] No Take 1 tablet (10 mg total) by mouth every 6 (six) hours as needed for up to 30 days.  Patient not taking: Reported on 08/12/2018   Vevelyn Francois, NP Not Taking Active   Oxycodone HCl 10 MG TABS [id number] Yes Take 1 tablet (10 mg total) by mouth every 6 (six) hours as needed for up to 30 days. Vevelyn Francois, NP Taking Active   potassium chloride (KLOR-CON) 20 MEQ packet [id number] Yes Take 20 mEq by mouth daily. [provider] Taking Active Spouse/Significant Other  SUMAtriptan (IMITREX) 100 MG tablet [id number] Yes TAKE ONE TABLET AT ONSET OF HEADACHE MAY REPEAT IN TWO HOURS IF NECESSARY LIMIT OF TWO IN 24 HOURS [name], DO Taking Active   Med List Note Dewayne Shorter, RN 04/22/18 8546): UDS 01/20/18 MR 07-27-2018 Medication agreement updated 2020           Assessment:   Goals Addressed            This Visit's Progress     Patient Stated   . "I don't like taking medications" (pt-stated)       Current Barriers:  . Lack of knowledge about appropriate medication management o Notes that he takes metoprolol XL 125 mg QAM and hydralazine 50 mg QPM; is not taking hydralazine BID as prescribed o Also notes that he is taking 3-6 diphenhydramine tablets daily for  "allergies" and "itching from my pain medications" o Confirms that he has started taking metformin XR 1000 mg BID, instead of taking on a sliding scale fashion based on his blood sugars   Pharmacist Clinical Goal(s):  [name] Over the next 30 days, patient will work with PharmD and primary care provider to address needs related to appropriate management of medications  Interventions: . Comprehensive medication review performed. . Educated patient on hydralazine BID dosing. Discussed monitoring of blood pressures and to bring results to upcoming primary care provider  appointment . Discussed CNS sedation with such supratherapeutic doses of diphenhydramine, particularly concerning in combination with narcotics. Encouraged to try second generation antihistamine (loratidine, cetirizine, fexofenidine) + fluticasone nasal spray for allergies, instead of diphenhydramine. Patient verbalized understanding . Reiterated dosing of metformin and benefits. Congratulated patient on A1c improvement. Encouraged to stop drinking a 6 pack of sugar beverages BID, discussed long term concerns with uncontrolled blood sugars.   Patient Self Care Activities:  . Calls pharmacy for medication refills . Calls provider office for new concerns or questions  Initial goal documentation        Plan: - Will collaborate with Janci Minor, RN CM regarding knee brace - Will plan to meet with patient face to face at primary care provider visit later this month.   Catie Darnelle Maffucci, PharmD Clinical Pharmacist [place] [phone]

## 2018-08-25 ENCOUNTER — Ambulatory Visit (INDEPENDENT_AMBULATORY_CARE_PROVIDER_SITE_OTHER): Payer: Medicare Other | Admitting: Family Medicine

## 2018-08-25 ENCOUNTER — Other Ambulatory Visit: Payer: Self-pay

## 2018-08-25 ENCOUNTER — Ambulatory Visit: Payer: Self-pay | Admitting: Pharmacist

## 2018-08-25 ENCOUNTER — Encounter: Payer: Self-pay | Admitting: Family Medicine

## 2018-08-25 DIAGNOSIS — I129 Hypertensive chronic kidney disease with stage 1 through stage 4 chronic kidney disease, or unspecified chronic kidney disease: Secondary | ICD-10-CM | POA: Diagnosis not present

## 2018-08-25 DIAGNOSIS — G43009 Migraine without aura, not intractable, without status migrainosus: Secondary | ICD-10-CM

## 2018-08-25 DIAGNOSIS — N181 Chronic kidney disease, stage 1: Secondary | ICD-10-CM

## 2018-08-25 DIAGNOSIS — E1122 Type 2 diabetes mellitus with diabetic chronic kidney disease: Secondary | ICD-10-CM

## 2018-08-25 MED ORDER — NORTRIPTYLINE HCL 25 MG PO CAPS: 25.0000 mg | ORAL_CAPSULE | Freq: Every day | ORAL | 3 refills | Status: DC

## 2018-08-25 NOTE — Patient Instructions (Signed)
Visit Information  Goals Addressed            This Visit's Progress     Patient Stated   . "I don't like taking medications" (pt-stated)       Current Barriers:  . Lack of knowledge about appropriate medication management o Also notes that he is taking 2-3 diphenhydramine tablets twice daily for "allergies". Patient states that this is the only medication that he has tried that helps with allergy management and that he only feels drowsy when he is not being active o Requested a list of his medications to help organize his pill box  Pharmacist Clinical Goal(s):  [name] Over the next 30 days, patient will work with PharmD and primary care provider to address needs related to appropriate management of medications  Interventions: . Comprehensive medication review performed. . Discussed CNS sedation with such supratherapeutic doses of diphenhydramine. Encouraged to try cetirizine + fluticasone nasal spray for allergies and start cutting back on diphenhydramine use.  [name] Provided patient with medication list to help him fill his pill box appropriately.  o In the mornings take hydralazine, metformin, metoprolol succinate, potassium o In the evenings take hydralazine, metformin, atorvastatin o At bedtime take nortriptyline o Take as needed: esomeprazole, sumatriptan, oxycodone, benadryl (advised patient to cut back on benadryl use), Zyrtec, Flonase       Patient Self Care Activities:  . Calls pharmacy for medication refills . Calls provider office for new concerns or questions         The patient verbalized understanding of instructions provided today and declined a print copy of patient instruction materials.   Plan:  - PharmD will follow up with patient in 3-5 weeks for continued medication management support   Catie Darnelle Maffucci, PharmD Clinical Pharmacist [place] [phone]

## 2018-08-25 NOTE — Progress Notes (Signed)
BP (!) 171/110   Pulse 71   Temp 98.4 F (36.9 C) (Oral)   Ht 5\' 10"  (1.778 m)   Wt 225 lb (102.1 kg)   SpO2 98%   BMI 32.28 kg/m    Subjective:    Patient ID: Kristopher [name], male    DOB: 11-20-1960, 58 y.o.   MRN: [id number]  HPI: Kristopher [name] is a 58 y.o. male  Chief Complaint  Patient presents with  . Follow-up  . Migraine   MIGRAINES- not geting any better, seems to be getting worse.  Duration: worse in the last 2 months Onset: sudden Severity: severe Quality: sharp  Frequency: intermittent Location: back of the head Headache duration: about a week, 1-2 a month Radiation: no Time of day headache occurs: at random Alleviating factors: imitrex Aggravating factors: nothing Headache status at time of visit: asymptomatic Treatments attempted: Treatments attempted: rest, ice, heat, APAP, ibuprofen, aleve", excedrine, triptans and propranolol   Aura: yes Nausea:  no Vomiting: no Photophobia:  yes Phonophobia:  no Effect on social functioning:  yes Confusion:  no Gait disturbance/ataxia:  no Behavioral changes:  no Fevers:  no  HYPERTENSION Hypertension status: uncontrolled  Satisfied with current treatment? no Duration of hypertension: chronic BP monitoring frequency:  a few times a week BP medication side effects:  no Medication compliance: good compliance Aspirin: no Recurrent headaches: yes Visual changes: no Palpitations: no Dyspnea: no Chest pain: no Lower extremity edema: no Dizzy/lightheaded: no   Relevant past medical, surgical, family and social history reviewed and updated as indicated. Interim medical history since our last visit reviewed. Allergies and medications reviewed and updated.  Review of Systems  Constitutional: Negative.   Respiratory: Negative.   Cardiovascular: Negative.   Musculoskeletal: Negative.   Neurological: Positive for headaches. Negative for dizziness, tremors, seizures, syncope, facial asymmetry, speech  difficulty, weakness, light-headedness and numbness.  Hematological: Negative.   Psychiatric/Behavioral: Negative.     Per HPI unless specifically indicated above     Objective:    BP (!) 171/110   Pulse 71   Temp 98.4 F (36.9 C) (Oral)   Ht 5\' 10"  (1.778 m)   Wt 225 lb (102.1 kg)   SpO2 98%   BMI 32.28 kg/m   Wt Readings from Last 3 Encounters:  08/25/18 225 lb (102.1 kg)  08/12/18 224 lb (101.6 kg)  07/20/18 232 lb (105.2 kg)    Physical Exam Vitals signs and nursing note reviewed.  Constitutional:      General: He is not in acute distress.    Appearance: Normal appearance. He is not ill-appearing, toxic-appearing or diaphoretic.  HENT:     Head: Normocephalic and atraumatic.     Right Ear: External ear normal.     Left Ear: External ear normal.     Nose: Nose normal.     Mouth/Throat:     Mouth: Mucous membranes are moist.     Pharynx: Oropharynx is clear.  Eyes:     General: No scleral icterus.       Right eye: No discharge.        Left eye: No discharge.     Extraocular Movements: Extraocular movements intact.     Conjunctiva/sclera: Conjunctivae normal.     Pupils: Pupils are equal, round, and reactive to light.  Neck:     Musculoskeletal: Normal range of motion and neck supple.  Cardiovascular:     Rate and Rhythm: Normal rate and regular rhythm.     Pulses:  Normal pulses.     Heart sounds: Normal heart sounds. No murmur. No friction rub. No gallop.   Pulmonary:     Effort: Pulmonary effort is normal. No respiratory distress.     Breath sounds: Normal breath sounds. No stridor. No wheezing, rhonchi or rales.  Chest:     Chest wall: No tenderness.  Musculoskeletal: Normal range of motion.  Skin:    General: Skin is warm and dry.     Capillary Refill: Capillary refill takes less than 2 seconds.     Coloration: Skin is not jaundiced or pale.     Findings: No bruising, erythema, lesion or rash.  Neurological:     General: No focal deficit present.      Mental Status: He is alert and oriented to person, place, and time. Mental status is at baseline.  Psychiatric:        Mood and Affect: Mood normal.        Behavior: Behavior normal.        Thought Content: Thought content normal.        Judgment: Judgment normal.     Results for orders placed or performed in visit on 07/20/18  Bayer DCA Hb A1c Waived  Result Value Ref Range   HB A1C (BAYER DCA - WAIVED) 6.2 <7.0 %  Comprehensive metabolic panel  Result Value Ref Range   Glucose 121 (H) 65 - 99 mg/dL   BUN 12 6 - 24 mg/dL   Creatinine, Ser 0.94 0.76 - 1.27 mg/dL   GFR calc non Af Amer 90 >59 mL/min/1.73   GFR calc Af Amer 104 >59 mL/min/1.73   BUN/Creatinine Ratio 13 9 - 20   Sodium 145 (H) 134 - 144 mmol/L   Potassium 3.6 3.5 - 5.2 mmol/L   Chloride 102 96 - 106 mmol/L   CO2 26 20 - 29 mmol/L   Calcium 9.8 8.7 - 10.2 mg/dL   Total Protein 7.0 6.0 - 8.5 g/dL   Albumin 4.6 3.8 - 4.9 g/dL   Globulin, Total 2.4 1.5 - 4.5 g/dL   Albumin/Globulin Ratio 1.9 1.2 - 2.2   Bilirubin Total 0.5 0.0 - 1.2 mg/dL   Alkaline Phosphatase 111 39 - 117 IU/L   AST 39 0 - 40 IU/L   ALT 48 (H) 0 - 44 IU/L  Lipid Panel w/o Chol/HDL Ratio  Result Value Ref Range   Cholesterol, Total 125 100 - 199 mg/dL   Triglycerides 154 (H) 0 - 149 mg/dL   HDL 34 (L) >39 mg/dL   VLDL Cholesterol Cal 31 5 - 40 mg/dL   LDL Calculated 60 0 - 99 mg/dL      Assessment & Plan:   Problem List Items Addressed This Visit      Cardiovascular and Mediastinum   Migraine    Not under good control. Will start nortriptyline and recheck 1 month. Call with any concerns. Continue to monitor.       Relevant Medications   nortriptyline (PAMELOR) 25 MG capsule     Genitourinary   Benign hypertensive renal disease    Has only been taking his hydralazine 1x a day- will go back to taking it 2x a day and recheck BP in 1 month. Call with any concerns.           Follow up plan: Return in about 4 weeks (around  09/22/2018) for follow up BP and migraine.

## 2018-08-25 NOTE — Assessment & Plan Note (Signed)
Has only been taking his hydralazine 1x a day- will go back to taking it 2x a day and recheck BP in 1 month. Call with any concerns.

## 2018-08-25 NOTE — Assessment & Plan Note (Signed)
Not under good control. Will start nortriptyline and recheck 1 month. Call with any concerns. Continue to monitor.

## 2018-08-25 NOTE — Chronic Care Management (AMB) (Signed)
  Chronic Care Management   Follow Up Note   08/25/2018 Name: Kristopher [name] MRN: [id number] DOB: [birth year]  Referred by: [name], DO Reason for referral : Chronic Care Management (Medication Management)   Kristopher [name] is a 58 y.o. year old male who is a primary care patient of [name], DO. The CCM team was consulted for assistance with chronic disease management and care coordination needs.    Spoke with patient face to face after primary care provider appointment.   Review of patient status, including review of consultants reports, relevant laboratory and other test results, and collaboration with appropriate care team members and the patient's provider was performed as part of comprehensive patient evaluation and provision of chronic care management services.    Goals Addressed            This Visit's Progress     Patient Stated   . "I don't like taking medications" (pt-stated)       Current Barriers:  . Lack of knowledge about appropriate medication management o Also notes that he is taking 2-3 diphenhydramine tablets twice daily for "allergies". Patient states that this is the only medication that he has tried that helps with allergy management and that he only feels drowsy when he is not being active o Requested a list of his medications to help organize his pill box  Pharmacist Clinical Goal(s):  [name] Over the next 30 days, patient will work with PharmD and primary care provider to address needs related to appropriate management of medications  Interventions: . Comprehensive medication review performed. . Discussed CNS sedation with such supratherapeutic doses of diphenhydramine. Encouraged to try cetirizine + fluticasone nasal spray for allergies and start cutting back on diphenhydramine use.  [name] Provided patient with medication list to help him fill his pill box appropriately.  o In the mornings take hydralazine, metformin, metoprolol succinate,  potassium o In the evenings take hydralazine, metformin, atorvastatin o At bedtime take nortriptyline o Take as needed: esomeprazole, sumatriptan, oxycodone, benadryl (advised patient to cut back on benadryl use), Zyrtec, Flonase       Patient Self Care Activities:  . Calls pharmacy for medication refills . Calls provider office for new concerns or questions         Plan:  - PharmD will follow up with patient in 3-5 weeks for continued medication management support   Catie Darnelle Maffucci, PharmD Clinical Pharmacist [place] [phone]

## 2018-09-15 ENCOUNTER — Telehealth: Payer: Self-pay

## 2018-09-18 ENCOUNTER — Telehealth: Payer: Self-pay | Admitting: *Deleted

## 2018-09-18 ENCOUNTER — Telehealth: Payer: Self-pay

## 2018-09-18 NOTE — Telephone Encounter (Signed)
Left message for patient to notify them that it is time to schedule annual low dose lung cancer screening CT scan. Instructed patient to call back to verify information prior to the scan being scheduled.  

## 2018-09-22 ENCOUNTER — Ambulatory Visit (INDEPENDENT_AMBULATORY_CARE_PROVIDER_SITE_OTHER): Payer: Medicare Other | Admitting: Family Medicine

## 2018-09-22 ENCOUNTER — Other Ambulatory Visit: Payer: Self-pay

## 2018-09-22 ENCOUNTER — Encounter: Payer: Self-pay | Admitting: Family Medicine

## 2018-09-22 VITALS — BP 188/110 | HR 74 | Temp 98.6°F | Ht 70.0 in | Wt 225.0 lb

## 2018-09-22 DIAGNOSIS — J301 Allergic rhinitis due to pollen: Secondary | ICD-10-CM | POA: Diagnosis not present

## 2018-09-22 DIAGNOSIS — I129 Hypertensive chronic kidney disease with stage 1 through stage 4 chronic kidney disease, or unspecified chronic kidney disease: Secondary | ICD-10-CM

## 2018-09-22 DIAGNOSIS — G43009 Migraine without aura, not intractable, without status migrainosus: Secondary | ICD-10-CM

## 2018-09-22 MED ORDER — MONTELUKAST SODIUM 10 MG PO TABS: 10.0000 mg | ORAL_TABLET | Freq: Every day | ORAL | 3 refills | Status: DC

## 2018-09-22 MED ORDER — NORTRIPTYLINE HCL 25 MG PO CAPS: 50.0000 mg | ORAL_CAPSULE | Freq: Every day | ORAL | 3 refills | Status: DC

## 2018-09-22 MED ORDER — HYDRALAZINE HCL 100 MG PO TABS: 100.0000 mg | ORAL_TABLET | Freq: Two times a day (BID) | ORAL | 1 refills | Status: DC

## 2018-09-22 NOTE — Patient Instructions (Signed)
START TAKING HYDRALAZINE 50mg  (that you have at home) 2 tabs in the morning and 2 tabs at night- I've sent you through the 100mg  tabs when you run out of what you have at home, it will be 1 tab in the morning and 1 tab at night

## 2018-09-22 NOTE — Progress Notes (Signed)
BP (!) 188/110 (BP Location: Left Arm, Cuff Size: Normal)   Pulse 74   Temp 98.6 F (37 C) (Oral)   Ht 5\' 10"  (1.778 m)   Wt 225 lb (102.1 kg)   SpO2 97%   BMI 32.28 kg/m    Subjective:    Patient ID: Kristopher [name], male    DOB: 1960-05-02, 58 y.o.   MRN: [id number]  HPI: Kristopher [name] is a 58 y.o. male  Chief Complaint  Patient presents with  . Migraine    f/u  . Hypertension   HYPERTENSION Hypertension status: uncontrolled  Satisfied with current treatment? no Duration of hypertension: chronic BP monitoring frequency:  not checking BP medication side effects:  no Medication compliance: good compliance Previous BP meds: hydralazine, metoprolol Aspirin: no Recurrent headaches: yes Visual changes: no Palpitations: no Dyspnea: no Chest pain: no Lower extremity edema: no Dizzy/lightheaded: no  MIGRAINES- feels like the medicine is helping. Giving him dry mouth. Has had 2 migraines since last visit.   Has been taking too much benadryl. Notes that he tried changing from benadryl to zyrtec and didn't find that it helped. Continues with congestion, sneezing and irritation from the pollen. No other concerns or complaints at this time.   Relevant past medical, surgical, family and social history reviewed and updated as indicated. Interim medical history since our last visit reviewed. Allergies and medications reviewed and updated.  Review of Systems  Constitutional: Negative.   HENT: Positive for congestion, postnasal drip, rhinorrhea and sneezing. Negative for dental problem, drooling, ear discharge, ear pain, facial swelling, hearing loss, mouth sores, nosebleeds, sinus pressure, sinus pain, sore throat, tinnitus, trouble swallowing and voice change.   Respiratory: Negative.   Cardiovascular: Negative.   Neurological: Positive for headaches. Negative for dizziness, tremors, seizures, syncope, facial asymmetry, speech difficulty, weakness, light-headedness and  numbness.  Psychiatric/Behavioral: Negative.     Per HPI unless specifically indicated above     Objective:    BP (!) 188/110 (BP Location: Left Arm, Cuff Size: Normal)   Pulse 74   Temp 98.6 F (37 C) (Oral)   Ht 5\' 10"  (1.778 m)   Wt 225 lb (102.1 kg)   SpO2 97%   BMI 32.28 kg/m   Wt Readings from Last 3 Encounters:  09/22/18 225 lb (102.1 kg)  08/25/18 225 lb (102.1 kg)  08/12/18 224 lb (101.6 kg)    Physical Exam Vitals signs and nursing note reviewed.  Constitutional:      General: He is not in acute distress.    Appearance: Normal appearance. He is not ill-appearing, toxic-appearing or diaphoretic.  HENT:     Head: Normocephalic and atraumatic.     Right Ear: External ear normal.     Left Ear: External ear normal.     Nose: Nose normal.     Mouth/Throat:     Mouth: Mucous membranes are moist.     Pharynx: Oropharynx is clear.  Eyes:     General: No scleral icterus.       Right eye: No discharge.        Left eye: No discharge.     Extraocular Movements: Extraocular movements intact.     Conjunctiva/sclera: Conjunctivae normal.     Pupils: Pupils are equal, round, and reactive to light.  Neck:     Musculoskeletal: Normal range of motion and neck supple.  Cardiovascular:     Rate and Rhythm: Normal rate and regular rhythm.     Pulses: Normal pulses.  Heart sounds: Normal heart sounds. No murmur. No friction rub. No gallop.   Pulmonary:     Effort: Pulmonary effort is normal. No respiratory distress.     Breath sounds: Normal breath sounds. No stridor. No wheezing, rhonchi or rales.  Chest:     Chest wall: No tenderness.  Musculoskeletal: Normal range of motion.  Skin:    General: Skin is warm and dry.     Capillary Refill: Capillary refill takes less than 2 seconds.     Coloration: Skin is not jaundiced or pale.     Findings: No bruising, erythema, lesion or rash.  Neurological:     General: No focal deficit present.     Mental Status: He is alert  and oriented to person, place, and time. Mental status is at baseline.  Psychiatric:        Mood and Affect: Mood normal.        Behavior: Behavior normal.        Thought Content: Thought content normal.        Judgment: Judgment normal.     Results for orders placed or performed in visit on 07/20/18  Bayer DCA Hb A1c Waived  Result Value Ref Range   HB A1C (BAYER DCA - WAIVED) 6.2 <7.0 %  Comprehensive metabolic panel  Result Value Ref Range   Glucose 121 (H) 65 - 99 mg/dL   BUN 12 6 - 24 mg/dL   Creatinine, Ser 0.94 0.76 - 1.27 mg/dL   GFR calc non Af Amer 90 >59 mL/min/1.73   GFR calc Af Amer 104 >59 mL/min/1.73   BUN/Creatinine Ratio 13 9 - 20   Sodium 145 (H) 134 - 144 mmol/L   Potassium 3.6 3.5 - 5.2 mmol/L   Chloride 102 96 - 106 mmol/L   CO2 26 20 - 29 mmol/L   Calcium 9.8 8.7 - 10.2 mg/dL   Total Protein 7.0 6.0 - 8.5 g/dL   Albumin 4.6 3.8 - 4.9 g/dL   Globulin, Total 2.4 1.5 - 4.5 g/dL   Albumin/Globulin Ratio 1.9 1.2 - 2.2   Bilirubin Total 0.5 0.0 - 1.2 mg/dL   Alkaline Phosphatase 111 39 - 117 IU/L   AST 39 0 - 40 IU/L   ALT 48 (H) 0 - 44 IU/L  Lipid Panel w/o Chol/HDL Ratio  Result Value Ref Range   Cholesterol, Total 125 100 - 199 mg/dL   Triglycerides 154 (H) 0 - 149 mg/dL   HDL 34 (L) >39 mg/dL   VLDL Cholesterol Cal 31 5 - 40 mg/dL   LDL Calculated 60 0 - 99 mg/dL      Assessment & Plan:   Problem List Items Addressed This Visit      Cardiovascular and Mediastinum   Migraine    Doing better, but not quite there. Will increase his nortriptyline to 50mg  and continue to monitor.       Relevant Medications   nortriptyline (PAMELOR) 25 MG capsule   hydrALAZINE (APRESOLINE) 100 MG tablet     Genitourinary   Benign hypertensive renal disease - Primary    Not under good control. Will increase hydralazine to 100mg  BID and recheck 2 weeks. Will check labs at that time.        Other Visit Diagnoses    Allergic rhinitis due to pollen, unspecified  seasonality       Has been taking too much benadryl. Tried zyrtec without much benefit- will continue zyrtec and add singulair. Call with nay concerns.  Follow up plan: Return 1-2 weeks, for follow up BP.

## 2018-09-22 NOTE — Assessment & Plan Note (Signed)
Doing better, but not quite there. Will increase his nortriptyline to 50mg  and continue to monitor.

## 2018-09-22 NOTE — Assessment & Plan Note (Signed)
Not under good control. Will increase hydralazine to 100mg  BID and recheck 2 weeks. Will check labs at that time.

## 2018-10-06 ENCOUNTER — Ambulatory Visit (INDEPENDENT_AMBULATORY_CARE_PROVIDER_SITE_OTHER): Payer: Medicare Other | Admitting: Family Medicine

## 2018-10-06 ENCOUNTER — Encounter: Payer: Self-pay | Admitting: Family Medicine

## 2018-10-06 ENCOUNTER — Other Ambulatory Visit: Payer: Self-pay

## 2018-10-06 VITALS — BP 157/102 | HR 83 | Temp 98.6°F

## 2018-10-06 DIAGNOSIS — E785 Hyperlipidemia, unspecified: Secondary | ICD-10-CM | POA: Diagnosis not present

## 2018-10-06 DIAGNOSIS — I129 Hypertensive chronic kidney disease with stage 1 through stage 4 chronic kidney disease, or unspecified chronic kidney disease: Secondary | ICD-10-CM | POA: Diagnosis not present

## 2018-10-06 DIAGNOSIS — E1122 Type 2 diabetes mellitus with diabetic chronic kidney disease: Secondary | ICD-10-CM

## 2018-10-06 DIAGNOSIS — E1169 Type 2 diabetes mellitus with other specified complication: Secondary | ICD-10-CM

## 2018-10-06 DIAGNOSIS — N181 Chronic kidney disease, stage 1: Secondary | ICD-10-CM | POA: Diagnosis not present

## 2018-10-06 LAB — BAYER DCA HB A1C WAIVED: HB A1C (BAYER DCA - WAIVED): 5.8 % (ref <7.0)

## 2018-10-06 MED ORDER — METFORMIN HCL ER 500 MG PO TB24: 500.0000 mg | ORAL_TABLET | Freq: Two times a day (BID) | ORAL | 0 refills | Status: DC

## 2018-10-06 NOTE — Assessment & Plan Note (Signed)
Doing much better with A1c of 5.8- will reduce metformin to 500mg  BID and recheck 3 months. Call with any concerns.
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
## 2018-10-06 NOTE — Assessment & Plan Note (Signed)
Under good control on current regimen. Continue current regimen. Continue to monitor. Call with any concerns. Refills given. Labs drawn today.   
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
## 2018-10-07 ENCOUNTER — Telehealth: Payer: Self-pay | Admitting: *Deleted

## 2018-10-07 ENCOUNTER — Other Ambulatory Visit: Payer: Self-pay | Admitting: Family Medicine

## 2018-10-07 DIAGNOSIS — Z122 Encounter for screening for malignant neoplasm of respiratory organs: Secondary | ICD-10-CM

## 2018-10-07 DIAGNOSIS — Z87891 Personal history of nicotine dependence: Secondary | ICD-10-CM

## 2018-10-07 LAB — COMPREHENSIVE METABOLIC PANEL
ALT: 41 IU/L (ref 0–44)
AST: 38 IU/L (ref 0–40)
Albumin/Globulin Ratio: 1.9 (ref 1.2–2.2)
Albumin: 4.5 g/dL (ref 3.8–4.9)
Alkaline Phosphatase: 109 IU/L (ref 39–117)
BUN/Creatinine Ratio: 12 (ref 9–20)
BUN: 11 mg/dL (ref 6–24)
Bilirubin Total: 0.5 mg/dL (ref 0.0–1.2)
CO2: 25 mmol/L (ref 20–29)
Calcium: 9.5 mg/dL (ref 8.7–10.2)
Chloride: 101 mmol/L (ref 96–106)
Creatinine, Ser: 0.94 mg/dL (ref 0.76–1.27)
GFR calc Af Amer: 104 mL/min/{1.73_m2} (ref >59)
GFR calc non Af Amer: 90 mL/min/{1.73_m2} (ref >59)
Globulin, Total: 2.4 g/dL (ref 1.5–4.5)
Glucose: 115 mg/dL — ABNORMAL HIGH (ref 65–99)
Potassium: 3.7 mmol/L (ref 3.5–5.2)
Sodium: 142 mmol/L (ref 134–144)
Total Protein: 6.9 g/dL (ref 6.0–8.5)

## 2018-10-07 LAB — LIPID PANEL W/O CHOL/HDL RATIO
Cholesterol, Total: 115 mg/dL (ref 100–199)
HDL: 32 mg/dL — ABNORMAL LOW (ref >39)
LDL Calculated: 49 mg/dL (ref 0–99)
Triglycerides: 170 mg/dL — ABNORMAL HIGH (ref 0–149)
VLDL Cholesterol Cal: 34 mg/dL (ref 5–40)

## 2018-10-07 NOTE — Telephone Encounter (Signed)
Patient has been notified that annual lung cancer screening low dose CT scan is due currently or will be in near future. Confirmed that patient is within the age range of 55-77, and asymptomatic, (no signs or symptoms of lung cancer). Patient denies illness that would prevent curative treatment for lung cancer if found. Verified smoking history, (current, 35.75 pack year). The shared decision making visit was done 08/21/17. Patient is agreeable for CT scan being scheduled.

## 2018-10-08 ENCOUNTER — Other Ambulatory Visit: Payer: Self-pay | Admitting: Family Medicine

## 2018-10-08 NOTE — Telephone Encounter (Signed)
Forwarding medication refill request to PCP for review. 

## 2018-10-09 ENCOUNTER — Ambulatory Visit (INDEPENDENT_AMBULATORY_CARE_PROVIDER_SITE_OTHER): Payer: Medicare Other | Admitting: *Deleted

## 2018-10-09 DIAGNOSIS — I129 Hypertensive chronic kidney disease with stage 1 through stage 4 chronic kidney disease, or unspecified chronic kidney disease: Secondary | ICD-10-CM

## 2018-10-09 DIAGNOSIS — G894 Chronic pain syndrome: Secondary | ICD-10-CM

## 2018-10-09 NOTE — Patient Instructions (Signed)
Thank you allowing the Chronic Care Management Team to be a part of your care! It was a pleasure speaking with you today!   CCM (Chronic Care Management) Team   Karsten Howry RN, BSN Nurse Care Coordinator  [phone]  [facility] PharmD  Clinical Pharmacist  [phone]  Eula Fried LCSW Clinical Social Worker [phone]  Goals Addressed            This Visit's Progress   . RNCM- I need to stay healthy for my granddaughter (pt-stated)       Current Barriers:  [name] Knowledge Deficits related to basic understanding of hypertension pathophysiology and self care management . Knowledge Deficits related to understanding of medications prescribed for management of hypertension . Patient continues to smoke 1.5 packs a day . Exercise program limited by need of a new knee brace  Case Manager Clinical Goal(s):  [name] Over the next 90 days, patient will verbalize understanding of plan for hypertension management  Interventions:  . Evaluation of current treatment plan related to hypertension self management and patient's adherence to plan as established by provider. . Provided education to patient re: stroke prevention, s/s of heart attack and stroke, DASH diet, complications of uncontrolled blood pressure . Discussed plans with patient for ongoing care management follow up and provided patient with direct contact information for care management team . Advised patient, providing education and rationale, to monitor blood pressure daily and record, calling PCP for findings outside established parameters.  . Reviewed scheduled/upcoming provider appointments including: CT for [place] cancer screening 8/14 . Discussed  Quitline- Texted number to his phone per his request . Collaboration with CCM pharmacist and PCP . Call placed to patient's Ortho MD Dr. Rudene Christians to request appt to evaluate patient's knee for a new brace.  . Made patient aware of Ortho appointment 10/21/18 @8 :30, texted patient  appointment information per patient's verbal request.   Patient Self Care Activities:  . UNABLE to independently manage HTN as evidenced by reported headaches and high b/p numbers.  . Checks BP and records as discussed  Initial goal documentation         The patient verbalized understanding of instructions provided today and declined a print copy of patient instruction materials.   The patient has been provided with contact information for the care management team and has been advised to call with any health related questions or concerns.

## 2018-10-09 NOTE — Chronic Care Management (AMB) (Signed)
  Chronic Care Management   Follow Up Note   10/09/2018 Name: Kristopher [name] MRN: [id number] DOB: 17-Jun-1960  Referred by: [name], DO Reason for referral : Chronic Care Management (HTN, New Knee Brace) and Care Coordination (Needs new knee brace)   EGBERT SEIDEL is a 58 y.o. year old male who is a primary care patient of [name], DO. The CCM team was consulted for assistance with chronic disease management and care coordination needs.    Review of patient status, including review of consultants reports, relevant laboratory and other test results, and collaboration with appropriate care team members and the patient's provider was performed as part of comprehensive patient evaluation and provision of chronic care management services.    Goals Addressed            This Visit's Progress   . RNCM- I need to stay healthy for my granddaughter (pt-stated)       Current Barriers:  [name] Knowledge Deficits related to basic understanding of hypertension pathophysiology and self care management . Knowledge Deficits related to understanding of medications prescribed for management of hypertension . Patient continues to smoke 1.5 packs a day . Exercise program limited by need of a new knee brace  Case Manager Clinical Goal(s):  [name] Over the next 90 days, patient will verbalize understanding of plan for hypertension management  Interventions:  . Evaluation of current treatment plan related to hypertension self management and patient's adherence to plan as established by provider. . Provided education to patient re: stroke prevention, s/s of heart attack and stroke, DASH diet, complications of uncontrolled blood pressure . Discussed plans with patient for ongoing care management follow up and provided patient with direct contact information for care management team . Advised patient, providing education and rationale, to monitor blood pressure daily and record, calling PCP for  findings outside established parameters.  . Reviewed scheduled/upcoming provider appointments including: CT for [place] cancer screening 8/14 and Ortho 8/19  . Discussed [place] Quitline- Texted number to his phone per his request . Collaboration with CCM pharmacist and PCP, noted patient was lost to cardiologist f/u post Hip Replacement surgery, will work on obtaining a new appointment related to continued reported high b/p with headaches.  . Call placed to patient's Ortho MD Dr. Rudene Christians to request appt to evaluate patient's knee for a new brace.  . Made patient aware of Ortho appointment 10/21/18 @8 :30, texted patient appointment information per patient's verbal request.   Patient Self Care Activities:  . UNABLE to independently manage HTN as evidenced by reported headaches and high b/p numbers.  . Checks BP and records as discussed  Initial goal documentation          The care management team will reach out to the patient again over the next 30 days.  The patient has been provided with contact information for the care management team and has been advised to call with any health related questions or concerns.    [name] [name] RN, BSN Nurse Case Editor, [facility]ing [facility]  [phone]) Business Mobile

## 2018-10-16 ENCOUNTER — Ambulatory Visit
Admission: RE | Admit: 2018-10-16 | Discharge: 2018-10-16 | Disposition: A | Discharge status: Home / Self Care | Payer: Medicare Other | Source: Ambulatory Visit | Attending: Oncology | Admitting: Oncology

## 2018-10-16 ENCOUNTER — Other Ambulatory Visit: Payer: Self-pay

## 2018-10-16 DIAGNOSIS — Z87891 Personal history of nicotine dependence: Secondary | ICD-10-CM

## 2018-10-16 DIAGNOSIS — Z122 Encounter for screening for malignant neoplasm of respiratory organs: Secondary | ICD-10-CM | POA: Diagnosis not present

## 2018-10-16 NOTE — Progress Notes (Signed)
Let me know if you need anything from me for this patient.   Kristopher [name]

## 2018-10-20 ENCOUNTER — Encounter: Payer: Self-pay | Admitting: Pain Medicine

## 2018-10-20 ENCOUNTER — Encounter: Payer: Self-pay | Admitting: *Deleted

## 2018-10-20 ENCOUNTER — Ambulatory Visit: Payer: Medicare Other | Admitting: Family Medicine

## 2018-10-20 ENCOUNTER — Other Ambulatory Visit: Payer: Self-pay | Admitting: *Deleted

## 2018-10-20 DIAGNOSIS — R911 Solitary pulmonary nodule: Secondary | ICD-10-CM

## 2018-10-20 NOTE — Progress Notes (Signed)
  Oncology Nurse Navigator Documentation  Navigator Location: [facility]Med [facility]c (10/20/18 1600) Referral Date to RadOnc/MedOnc: 10/19/18 (10/20/18 1600) )Navigator Encounter Type: Introductory Phone Call (10/20/18 1600)   Abnormal Finding Date: 10/16/18 (10/20/18 1600)                   Treatment Phase: Abnormal Scans (10/20/18 1600) Barriers/Navigation Needs: Coordination of Care (10/20/18 1600)   Interventions: Coordination of Care (10/20/18 1600)   Coordination of Care: Appts;Radiology (10/20/18 1600)        Acuity: Level 2 (10/20/18 1600)   Acuity Level 2: Initial guidance, education and coordination as needed;Educational needs;Assistance expediting appointments (10/20/18 1600)  Referral received from PCP for lung nodule found on LDCT from 8/14. Phone call made to patient and spoke with his significant other, Melinda. Introduced to navigator services and reviewed upcoming appts. Contact info given and instructed to call with any further questions or needs. Rip Harbour verbalized understanding. Nothing further needed at this time.    Time Spent with Patient: 45 (10/20/18 1600)

## 2018-10-20 NOTE — Progress Notes (Signed)
Pain Management Virtual Encounter Note - Virtual Visit via Telephone Telehealth (real-time audio visits between healthcare provider and patient).   Patient's Phone No. & Preferred Pharmacy:  [phone] (home); [phone] (mobile); (Preferred) [phone] No e-mail address on record  [id number] - [place], [place] [address] [place] [address] Phone: [phone] Fax: [phone]    Pre-screening note:  Our staff contacted Mr. Chabot and offered him an "in person", "face-to-face" appointment versus a telephone encounter. He indicated preferring the telephone encounter, at this time.   Reason for Virtual Visit: COVID-19*  Social distancing based on CDC and AMA recommendations.   I contacted [name] on 10/21/2018 via telephone.      I clearly identified myself as Gaspar Cola, MD. I verified that I was speaking with the correct person using two identifiers (Name: CLAIRE BRIDGE, and date of birth: 1960-09-16).  Advanced Informed Consent I sought verbal advanced consent from [name] for virtual visit interactions. I informed Mr. Dobratz of possible security and privacy concerns, risks, and limitations associated with providing "not-in-person" medical evaluation and management services. I also informed Mr. Shellhammer of the availability of "in-person" appointments. Finally, I informed him that there would be a charge for the virtual visit and that he could be  personally, fully or partially, financially responsible for it. Mr. Vegh expressed understanding and agreed to proceed.   Historic Elements   Mr. SHANTANU STRAUCH is a 58 y.o. year old, male patient evaluated today after his last encounter by our practice on 07/28/2018. Mr. Jowett  has a past medical history of Allergy, Arthritis, Benign hypertensive kidney disease, Chronic back pain, Diabetes mellitus, type 2 ([place]), Hypertension, and Migraines. He also  has a past surgical  history that includes Back surgery; Appendectomy; Leg Surgery; Knee surgery (Left); Foot surgery (Left); Colonoscopy with propofol (N/A, 02/19/2016); polypectomy (N/A, 02/19/2016); Total hip arthroplasty (Left, 12/02/2017); Colonoscopy with propofol (N/A, 01/19/2018); polypectomy (01/19/2018); DG OPERATIVE LEFT HIP ([place] HX); and Joint replacement (Left, 12/2017). Mr. Schunk has a current medication list which includes the following prescription(s): accu-chek guide, albuterol, atorvastatin, esomeprazole, hydralazine, metformin, metoprolol succinate, metoprolol succinate, montelukast, nortriptyline, oxycodone hcl, oxycodone hcl, oxycodone hcl, potassium chloride, sumatriptan, and NONFORMULARY OR COMPOUNDED ITEM. He  reports that he has been smoking cigarettes. He has a 35.00 pack-year smoking history. He has never used smokeless tobacco. He reports current drug use. Drug: Oxycodone. He reports that he does not drink alcohol. Mr. Lorenson is allergic to aspirin; epinephrine; lidocaine; novocain [procaine]; penicillins; strawberry extract; and shellfish allergy.   HPI  Today, he is being contacted for medication management.  The patient indicates doing well with the current medication regimen. No adverse reactions or side effects reported to the medications.  The patient also indicates that the cream that we have prescribed for him has worked Oceanographer in terms of helping his pain.  He would like for this to be continued.  In addition, today I have made arrangements for the patient to be referred to Dr. Mosetta Anis, specialist and allergy and immunology, at the lab our clinic in [place], for skin testing challenge for the local anesthetics.  I spoke to the physician on the phone about the case and I made arrangements for the referral.  I have also put my office manager in contact with their office manager so that we can make all the arrangements.  Pharmacotherapy Assessment  Analgesic:  Oxycodone IR 10 mg, 1 tab  PO q 6 hrs (40 mg/day of oxycodone) MME/day:60 mg/day.   Monitoring: Pharmacotherapy: No side-effects or adverse reactions reported. [place] PMP: PDMP reviewed during this encounter.       Compliance: No problems identified. Effectiveness: Clinically acceptable. Plan: Refer to "POC".  UDS:  Summary  Date Value Ref Range Status  01/20/2018 FINAL  Final    Comment:    ==================================================================== TOXASSURE SELECT 13 (MW) ==================================================================== Test                             Result       Flag       Units Drug Present   Oxycodone                      1855                    ng/mg creat   Oxymorphone                    685                     ng/mg creat   Noroxycodone                   1293                    ng/mg creat   Noroxymorphone                 103                     ng/mg creat    Sources of oxycodone are scheduled prescription medications.    Oxymorphone, noroxycodone, and noroxymorphone are expected    metabolites of oxycodone. Oxymorphone is also available as a    scheduled prescription medication. ==================================================================== Test                      Result    Flag   Units      Ref Range   Creatinine              96               mg/dL      >=20 ==================================================================== Declared Medications:  Medication list was not provided. ==================================================================== For clinical consultation, please call [phone]. ====================================================================    Laboratory Chemistry Profile (12 mo)  Renal: 10/06/2018: BUN 11; BUN/Creatinine Ratio 12; Creatinine, Ser 0.94  Lab Results  Component Value Date   GFRAA 104 10/06/2018   GFRNONAA 90 10/06/2018   Hepatic: 10/06/2018: Albumin 4.5 Lab Results  Component Value Date   AST 38 10/06/2018    ALT 41 10/06/2018   Other: 10/21/2017: Sed Rate 11 Note: Above Lab results reviewed.  Imaging  Last 90 days:  Ct Chest Lung Cancer Screening Low Dose Wo Contrast  Result Date: 10/16/2018 CLINICAL DATA:  58 year old male with 36 pack-year history of smoking. Lung cancer screening. EXAM: CT CHEST WITHOUT CONTRAST LOW-DOSE FOR LUNG CANCER SCREENING TECHNIQUE: Multidetector CT imaging of the chest was performed following the standard protocol without IV contrast. COMPARISON:  08/21/2017 FINDINGS: Cardiovascular: The heart size is normal. No substantial pericardial effusion. Atherosclerotic calcification is noted in the wall of the thoracic aorta. Mediastinum/Nodes: No mediastinal lymphadenopathy. No evidence for gross hilar lymphadenopathy although assessment is limited by the lack of intravenous  contrast on today's study. The esophagus has normal imaging features. There is no axillary lymphadenopathy. Lungs/Pleura: Centrilobular emphsyema noted. Tiny upper lung predominant centrilobular ground-glass nodules are compatible with smoking related lung disease. Stable subpleural posterior right upper lobe pulmonary nodule since prior study. The dominant 5.6 mm posterior left lower lobe pulmonary nodule seen on the previous study has progressed in the interval, now demonstrating volume derived equivalent diameter of 10.3 mm. This nodule is not in contact with a fissure or pleural surface. No pleural effusion. Upper Abdomen: Unremarkable. Calcified granulomata noted in the spleen. Musculoskeletal: No worrisome lytic or sclerotic osseous abnormality. IMPRESSION: 1. Interval progression of a posterior left lower lobe pulmonary nodule now measuring 10.3 mm. Lung-RADS 4B, suspicious. Additional imaging evaluation or consultation with Pulmonology or Thoracic Surgery recommended. 2. No evidence for lymphadenopathy in the mediastinum or hilar regions. 3.  Aortic Atherosclerois (ICD10-170.0) 4.  Emphysema. ([id number])  These results will be called to the ordering clinician or representative by the Radiologist Assistant, and communication documented in the PACS or zVision Dashboard. Electronically Signed   By: Misty Stanley M.D.   On: 10/16/2018 13:31    Assessment  The primary encounter diagnosis was Chronic pain syndrome. Diagnoses of Chronic low back pain (Primary Area of Pain) (Bilateral) (L>R), Chronic lower extremity pain (Secondary area of Pain) (Left), Chronic lumbar radicular pain (Secondary area of Pain) (S1 dermatomal) (Left), Chronic cervical radicular pain (Third area of Pain) (Bilateral) (C5/C6 dermatome) (L>R), Chronic pain of both upper extremities, and History of Allergy to amide type local anesthetic (Lidocaine) were also pertinent to this visit.  Plan of Care  I have changed Clester A. Mudry's Oxycodone HCl. I am also having him start on Oxycodone HCl and Oxycodone HCl. Additionally, I am having him maintain his esomeprazole, potassium chloride, SUMAtriptan, metoprolol succinate, albuterol, metoprolol succinate, Accu-Chek Guide, nortriptyline, hydrALAZINE, montelukast, metFORMIN, atorvastatin, and NONFORMULARY OR COMPOUNDED ITEM.  Pharmacotherapy (Medications Ordered): Meds ordered this encounter  Medications  . Oxycodone HCl 10 MG TABS    Sig: Take 1 tablet (10 mg total) by mouth every 6 (six) hours as needed. Must last 30 days    Dispense:  120 tablet    Refill:  0    Chronic Pain: STOP Act (Not applicable) Fill 1 day early if closed on refill date. Do not fill until: 10/26/2018. To last until: 11/25/2018. Avoid benzodiazepines within 8 hours of opioids  . Oxycodone HCl 10 MG TABS    Sig: Take 1 tablet (10 mg total) by mouth every 6 (six) hours as needed. Must last 30 days    Dispense:  120 tablet    Refill:  0    Chronic Pain: STOP Act (Not applicable) Fill 1 day early if closed on refill date. Do not fill until: 11/25/2018. To last until: 12/25/2018. Avoid benzodiazepines within 8 hours of  opioids  . Oxycodone HCl 10 MG TABS    Sig: Take 1 tablet (10 mg total) by mouth every 6 (six) hours as needed. Must last 30 days    Dispense:  120 tablet    Refill:  0    Chronic Pain: STOP Act (Not applicable) Fill 1 day early if closed on refill date. Do not fill until: 12/25/2018. To last until: 01/24/2019. Avoid benzodiazepines within 8 hours of opioids  . NONFORMULARY OR COMPOUNDED ITEM    Sig: 10% Ketamine/2% Cyclobenzaprine/6% Gabapentin Cream Sig: 1-2 ml to affected area 3-4 times/day. Amount: 240 GM    Dispense:  240 each  Refill:  2   Orders:  Orders Placed This Encounter  Procedures  . Ambulatory referral to Allergy    Referral Priority:   Routine    Referral Type:   Allergy Testing    Referral Reason:   Specialty Services Required    Referred to Provider:   Mosetta Anis, MD    Requested Specialty:   Allergy    Number of Visits Requested:   1   Follow-up plan:   Return in about 13 weeks (around 01/20/2019) for (VV), E/M (MM).      Interventional therapies: Planned, scheduled, and/or pending:   None at this time. Claims to have anaphylactic allergy to Lidocaine.   Considering:   None at this time. Claims to have anaphylactic allergy to Lidocaine.   Palliative PRN treatment(s):   None at this time. Claims to have anaphylactic allergy to Lidocaine.    Recent Visits No visits were found meeting these conditions.  Showing recent visits within past 90 days and meeting all other requirements   Today's Visits Date Type Provider Dept  10/21/18 Office Visit [name], MD [facility]  Showing today's visits and meeting all other requirements   Future Appointments No visits were found meeting these conditions.  Showing future appointments within next 90 days and meeting all other requirements   I discussed the assessment and treatment plan with the patient. The patient was provided an opportunity to ask questions and all were answered. The  patient agreed with the plan and demonstrated an understanding of the instructions.  Patient advised to call back or seek an in-person evaluation if the symptoms or condition worsens.  Total duration of non-face-to-face encounter: 15 minutes.  Note by: Gaspar Cola, MD Date: 10/21/2018; Time: 10:04 AM  Note: This dictation was prepared with Dragon dictation. Any transcriptional errors that may result from this process are unintentional.  Disclaimer:  * Given the special circumstances of the COVID-19 pandemic, the federal government has announced that the Office for Civil Rights (OCR) will exercise its enforcement discretion and will not impose penalties on physicians using telehealth in the event of noncompliance with regulatory requirements under the [place] (HIPAA) in connection with the good faith provision of telehealth during the JDBZM-08 national public health emergency. ([place])

## 2018-10-21 ENCOUNTER — Other Ambulatory Visit: Payer: Self-pay

## 2018-10-21 ENCOUNTER — Ambulatory Visit: Payer: Medicare Other | Attending: Pain Medicine | Admitting: Pain Medicine

## 2018-10-21 DIAGNOSIS — M5441 Lumbago with sciatica, right side: Secondary | ICD-10-CM

## 2018-10-21 DIAGNOSIS — M5416 Radiculopathy, lumbar region: Secondary | ICD-10-CM

## 2018-10-21 DIAGNOSIS — M5442 Lumbago with sciatica, left side: Secondary | ICD-10-CM

## 2018-10-21 DIAGNOSIS — M79601 Pain in right arm: Secondary | ICD-10-CM

## 2018-10-21 DIAGNOSIS — G894 Chronic pain syndrome: Secondary | ICD-10-CM

## 2018-10-21 DIAGNOSIS — M79602 Pain in left arm: Secondary | ICD-10-CM

## 2018-10-21 DIAGNOSIS — M79605 Pain in left leg: Secondary | ICD-10-CM

## 2018-10-21 DIAGNOSIS — M5412 Radiculopathy, cervical region: Secondary | ICD-10-CM

## 2018-10-21 DIAGNOSIS — G8929 Other chronic pain: Secondary | ICD-10-CM

## 2018-10-21 DIAGNOSIS — Z884 Allergy status to anesthetic agent status: Secondary | ICD-10-CM

## 2018-10-21 MED ORDER — OXYCODONE HCL 10 MG PO TABS: 10.0000 mg | ORAL_TABLET | Freq: Four times a day (QID) | ORAL | 0 refills | Status: DC | PRN

## 2018-10-21 MED ORDER — NONFORMULARY OR COMPOUNDED ITEM: 2 refills | Status: DC

## 2018-10-21 NOTE — Patient Instructions (Signed)
____________________________________________________________________________________________  Medication Rules  Purpose: To inform patients, and their family members, of our rules and regulations.  Applies to: All patients receiving prescriptions (written or electronic).  Pharmacy of record: Pharmacy where electronic prescriptions will be sent. If written prescriptions are taken to a different pharmacy, please inform the nursing staff. The pharmacy listed in the electronic medical record should be the one where you would like electronic prescriptions to be sent.  Electronic prescriptions: In compliance with the [place] (STOP) Act of 2017 (Session Lanny Cramp [phone]), effective March 04, 2018, all controlled substances must be electronically prescribed. Calling prescriptions to the pharmacy will cease to exist.  Prescription refills: Only during scheduled appointments. Applies to all prescriptions.  NOTE: The following applies primarily to controlled substances (Opioid* Pain Medications).   Patient's responsibilities: 1. Pain Pills: Bring all pain pills to every appointment (except for procedure appointments). 2. Pill Bottles: Bring pills in original pharmacy bottle. Always bring the newest bottle. Bring bottle, even if empty. 3. Medication refills: You are responsible for knowing and keeping track of what medications you take and those you need refilled. The day before your appointment: write a list of all prescriptions that need to be refilled. The day of the appointment: give the list to the admitting nurse. Prescriptions will be written only during appointments. No prescriptions will be written on procedure days. If you forget a medication: it will not be "Called in", "Faxed", or "electronically sent". You will need to get another appointment to get these prescribed. No early refills. Do not call asking to have your prescription filled  early. 4. Prescription Accuracy: You are responsible for carefully inspecting your prescriptions before leaving our office. Have the discharge nurse carefully go over each prescription with you, before taking them home. Make sure that your name is accurately spelled, that your address is correct. Check the name and dose of your medication to make sure it is accurate. Check the number of pills, and the written instructions to make sure they are clear and accurate. Make sure that you are given enough medication to last until your next medication refill appointment. 5. Taking Medication: Take medication as prescribed. When it comes to controlled substances, taking less pills or less frequently than prescribed is permitted and encouraged. Never take more pills than instructed. Never take medication more frequently than prescribed.  6. Inform other Doctors: Always inform, all of your healthcare providers, of all the medications you take. 7. Pain Medication from other Providers: You are not allowed to accept any additional pain medication from any other Doctor or Healthcare provider. There are two exceptions to this rule. (see below) In the event that you require additional pain medication, you are responsible for notifying us, as stated below. 8. Medication Agreement: You are responsible for carefully reading and following our Medication Agreement. This must be signed before receiving any prescriptions from our practice. Safely store a copy of your signed Agreement. Violations to the Agreement will result in no further prescriptions. (Additional copies of our Medication Agreement are available upon request.) 9. Laws, Rules, & Regulations: All patients are expected to follow all Federal and [employer], [employer], Rules, [employer]. Ignorance of the Laws does not constitute a valid excuse. The use of any illegal substances is prohibited. 10. Adopted CDC guidelines & recommendations: Target dosing levels will be  at or below 60 MME/day. Use of benzodiazepines** is not recommended.  Exceptions: There are only two exceptions to the rule of not  receiving pain medications from other Healthcare Providers. 1. Exception #1 (Emergencies): In the event of an emergency (i.e.: accident requiring emergency care), you are allowed to receive additional pain medication. However, you are responsible for: As soon as you are able, call our office [phone], at any time of the day or night, and leave a message stating your name, the date and nature of the emergency, and the name and dose of the medication prescribed. In the event that your call is answered by a member of our staff, make sure to document and save the date, time, and the name of the person that took your information.  2. Exception #2 (Planned Surgery): In the event that you are scheduled by another doctor or dentist to have any type of surgery or procedure, you are allowed (for a period no longer than 30 days), to receive additional pain medication, for the acute post-op pain. However, in this case, you are responsible for picking up a copy of our "Post-op Pain Management for Surgeons" handout, and giving it to your surgeon or dentist. This document is available at our office, and does not require an appointment to obtain it. Simply go to our office during business hours (Monday-Thursday from 8:00 AM to 4:00 PM) (Friday 8:00 AM to 12:00 Noon) or if you have a scheduled appointment with [place], prior to your surgery, and ask for it by name. In addition, you will need to provide [place] with your name, name of your surgeon, type of surgery, and date of procedure or surgery.  *Opioid medications include: morphine, codeine, oxycodone, oxymorphone, hydrocodone, hydromorphone, meperidine, tramadol, tapentadol, buprenorphine, fentanyl, methadone. **Benzodiazepine medications include: diazepam (Valium), alprazolam (Xanax), clonazepam (Klonopine), lorazepam (Ativan), clorazepate  (Tranxene), chlordiazepoxide (Librium), estazolam (Prosom), oxazepam (Serax), temazepam (Restoril), triazolam (Halcion) (Last updated: 05/01/2017) ____________________________________________________________________________________________   ____________________________________________________________________________________________  Medication Recommendations and Reminders  Applies to: All patients receiving prescriptions (written and/or electronic).  Medication Rules & Regulations: These rules and regulations exist for your safety and that of others. They are not flexible and neither are we. Dismissing or ignoring them will be considered "non-compliance" with medication therapy, resulting in complete and irreversible termination of such therapy. (See document titled "Medication Rules" for more details.) In all conscience, because of safety reasons, we cannot continue providing a therapy where the patient does not follow instructions.  Pharmacy of record:   Definition: This is the pharmacy where your electronic prescriptions will be sent.   We do not endorse any particular pharmacy.  You are not restricted in your choice of pharmacy.  The pharmacy listed in the electronic medical record should be the one where you want electronic prescriptions to be sent.  If you choose to change pharmacy, simply notify our nursing staff of your choice of new pharmacy.  Recommendations:  Keep all of your pain medications in a safe place, under lock and key, even if you live alone.   After you fill your prescription, take 1 week's worth of pills and put them away in a safe place. You should keep a separate, properly labeled bottle for this purpose. The remainder should be kept in the original bottle. Use this as your primary supply, until it runs out. Once it's gone, then you know that you have 1 week's worth of medicine, and it is time to come in for a prescription refill. If you do this correctly, it  is unlikely that you will ever run out of medicine.  To make sure that the above recommendation works,  it is very important that you make sure your medication refill appointments are scheduled at least 1 week before you run out of medicine. To do this in an effective manner, make sure that you do not leave the office without scheduling your next medication management appointment. Always ask the nursing staff to show you in your prescription , when your medication will be running out. Then arrange for the receptionist to get you a return appointment, at least 7 days before you run out of medicine. Do not wait until you have 1 or 2 pills left, to come in. This is very poor planning and does not take into consideration that we may need to cancel appointments due to bad weather, sickness, or emergencies affecting our staff.  "Partial Fill": If for any reason your pharmacy does not have enough pills/tablets to completely fill or refill your prescription, do not allow for a "partial fill". You will need a separate prescription to fill the remaining amount, which we will not provide. If the reason for the partial fill is your insurance, you will need to talk to the pharmacist about payment alternatives for the remaining tablets, but again, do not accept a partial fill.  Prescription refills and/or changes in medication(s):   Prescription refills, and/or changes in dose or medication, will be conducted only during scheduled medication management appointments. (Applies to both, written and electronic prescriptions.)  No refills on procedure days. No medication will be changed or started on procedure days. No changes, adjustments, and/or refills will be conducted on a procedure day. Doing so will interfere with the diagnostic portion of the procedure.  No phone refills. No medications will be "called into the pharmacy".  No Fax refills.  No weekend refills.  No Holliday refills.  No after hours  refills.  Remember:  Business hours are:  Monday to Thursday 8:00 AM to 4:00 PM Provider's Schedule: [name], MD - Appointments are:  Medication management: Monday and Wednesday 8:00 AM to 4:00 PM Procedure day: Tuesday and Thursday 7:30 AM to 4:00 PM Gillis Santa, MD - Appointments are:  Medication management: Tuesday and Thursday 8:00 AM to 4:00 PM Procedure day: Monday and Wednesday 7:30 AM to 4:00 PM (Last update: 05/01/2017) ____________________________________________________________________________________________   ____________________________________________________________________________________________  CANNABIDIOL (AKA: CBD Oil or Pills)  Applies to: All patients receiving prescriptions of controlled substances (written and/or electronic).  General Information: Cannabidiol (CBD) was discovered in 61. It is one of some 113 identified cannabinoids in cannabis (Marijuana) plants, accounting for up to 40% of the plant's extract. As of 2018, preliminary clinical research on cannabidiol included studies of anxiety, cognition, movement disorders, and pain.  Cannabidiol is consummed in multiple ways, including inhalation of cannabis smoke or vapor, as an aerosol spray into the cheek, and by mouth. It may be supplied as CBD oil containing CBD as the active ingredient (no added tetrahydrocannabinol (THC) or terpenes), a full-plant CBD-dominant hemp extract oil, capsules, dried cannabis, or as a liquid solution. CBD is thought not have the same psychoactivity as THC, and may affect the actions of THC. Studies suggest that CBD may interact with different biological targets, including cannabinoid receptors and other neurotransmitter receptors. As of 2018 the mechanism of action for its biological effects has not been determined.  In the [place], cannabidiol has a limited approval by the Food and Drug Administration (FDA) for treatment of only two types of epilepsy  disorders. The side effects of long-term use of the drug include somnolence, decreased appetite, diarrhea,  fatigue, malaise, weakness, sleeping problems, and others.  CBD remains a Schedule I drug prohibited for any use.  Legality: Some manufacturers ship CBD products nationally, an illegal action which the FDA has not enforced in 2018, with CBD remaining the subject of an FDA investigational new drug evaluation, and is not considered legal as a dietary supplement or food ingredient as of December 2018. Federal illegality has made it difficult historically to conduct research on CBD. CBD is openly sold in head shops and health food stores in some states where such sales have not been explicitly legalized.  Warning: Because it is not FDA approved for general use or treatment of pain, it is not required to undergo the same manufacturing controls as prescription drugs.  This means that the available cannabidiol (CBD) may be contaminated with THC.  If this is the case, it will trigger a positive urine drug screen (UDS) test for cannabinoids (Marijuana).  Because a positive UDS for illicit substances is a violation of our medication agreement, your opioid analgesics (pain medicine) may be permanently discontinued. (Last update: 05/22/2017) ____________________________________________________________________________________________

## 2018-10-22 ENCOUNTER — Telehealth: Payer: Self-pay | Admitting: *Deleted

## 2018-10-22 NOTE — Telephone Encounter (Signed)
Called patient and let him know that Dr. Dossie Arbour printed his script for compounded cream. I faxed it to the number we had on file and called the patient and informed. Please see media from today for future reference to this cream.

## 2018-10-23 ENCOUNTER — Telehealth: Payer: Self-pay

## 2018-10-26 ENCOUNTER — Telehealth: Payer: Self-pay | Admitting: Pain Medicine

## 2018-10-26 DIAGNOSIS — R911 Solitary pulmonary nodule: Secondary | ICD-10-CM | POA: Insufficient documentation

## 2018-10-26 NOTE — Telephone Encounter (Signed)
Kristopher [name] [place].  appt scheduled November 13, 2018 and medication can be dropped off 1 week prior to appt.  Lidocaine 2% and ropivocaine 0.2%.

## 2018-10-26 NOTE — Progress Notes (Deleted)
[place]  [phone] [phone]  ID: Kristopher [name] OB: 22-Mar-1960  MR#: [id number]  ZJI#:[id number]  Patient Care Team: [name], DO as PCP - General (Family Medicine) Gavin Pound, [place] (Inactive) as Certified Medical Assistant Guadalupe Maple, MD as Attending Physician (Family Medicine) De Hollingshead, [facility] as Pharmacist (Pharmacist) Minor, Dalbert Garnet, RN as [place] Management  CHIEF COMPLAINT: Left lower lobe pulmonary nodule  INTERVAL HISTORY: ***  REVIEW OF SYSTEMS:   ROS  As per HPI. Otherwise, a complete review of systems is negative.  PAST MEDICAL HISTORY: Past Medical History:  Diagnosis Date   Allergy    Arthritis    left foot   Benign hypertensive kidney disease    Chronic back pain    Four rods in back   Diabetes mellitus, type 2 ([place])    Hypertension    Migraines    daily    PAST SURGICAL HISTORY: Past Surgical History:  Procedure Laterality Date   APPENDECTOMY     BACK SURGERY     COLONOSCOPY WITH PROPOFOL N/A 02/19/2016   Procedure: COLONOSCOPY WITH PROPOFOL;  Surgeon: Lucilla Lame, MD;  Location: [facility];  Service: Endoscopy;  Laterality: N/A;   COLONOSCOPY WITH PROPOFOL N/A 01/19/2018   Procedure: COLONOSCOPY WITH PROPOFOL;  Surgeon: Lucilla Lame, MD;  Location: [facility];  Service: Endoscopy;  Laterality: N/A;  Diabetic - oral meds   DG OPERATIVE LEFT HIP ([place] HX)     10/19   FOOT SURGERY Left    Screws and plates   JOINT REPLACEMENT Left 12/2017   DR Rudene Christians   KNEE SURGERY Left    X 2   LEG SURGERY     POLYPECTOMY N/A 02/19/2016   Procedure: POLYPECTOMY;  Surgeon: Lucilla Lame, MD;  Location: [facility];  Service: Endoscopy;  Laterality: N/A;   POLYPECTOMY  01/19/2018   Procedure: POLYPECTOMY;  Surgeon: Lucilla Lame, MD;  Location: [facility];  Service: Endoscopy;;   TOTAL HIP ARTHROPLASTY Left  12/02/2017   Procedure: TOTAL HIP ARTHROPLASTY ANTERIOR APPROACH;  Surgeon: Hessie Knows, MD;  Location: [facility];  Service: Orthopedics;  Laterality: Left;    FAMILY HISTORY: Family History  Problem Relation Age of Onset   Cancer Father    Diabetes Sister    Thrombosis Sister     ADVANCED DIRECTIVES (Y/N):  N  HEALTH MAINTENANCE: Social History   Tobacco Use   Smoking status: Current Every Day Smoker    Packs/day: 1.00    Years: 35.00    Pack years: 35.00    Types: Cigarettes   Smokeless tobacco: Never Used  Substance Use Topics   Alcohol use: No    Alcohol/week: 0.0 standard drinks   Drug use: Yes    Types: Oxycodone     Colonoscopy:  PAP:  Bone density:  Lipid panel:  Allergies  Allergen Reactions   Aspirin Anaphylaxis   Epinephrine Anaphylaxis   Lidocaine Anaphylaxis   Novocain [Procaine] Anaphylaxis   Penicillins Anaphylaxis    Has patient had a PCN reaction causing immediate rash, facial/tongue/throat swelling, SOB or lightheadedness with hypotension: Yes Has patient had a PCN reaction causing severe rash involving mucus membranes or skin necrosis: No Has patient had a PCN reaction that required hospitalization: Yes Has patient had a PCN reaction occurring within the last 10 years: No If all of the above answers are "NO", then may proceed with Cephalosporin use.    Strawberry Extract Anaphylaxis  Shellfish Allergy Nausea And Vomiting    Current Outpatient Medications  Medication Sig Dispense Refill   ACCU-CHEK GUIDE test strip USE TO TEST BLOOD SUGAR 2X A DAY     albuterol (PROVENTIL HFA;VENTOLIN HFA) 108 (90 Base) MCG/ACT inhaler Inhale 2 puffs into the lungs every 6 (six) hours as needed for wheezing or shortness of breath. 1 Inhaler 3   atorvastatin (LIPITOR) 10 MG tablet TAKE 1 TABLET BY MOUTH EVERY DAY 90 tablet 1   esomeprazole (NEXIUM) 40 MG capsule Take 1 capsule (40 mg total) by mouth daily. (Patient taking differently: Take  40 mg by mouth 3 times/day as needed-between meals & bedtime. ) 90 capsule 3   hydrALAZINE (APRESOLINE) 100 MG tablet Take 1 tablet (100 mg total) by mouth 2 (two) times daily. 180 tablet 1   metFORMIN (GLUCOPHAGE-XR) 500 MG 24 hr tablet Take 1 tablet (500 mg total) by mouth 2 (two) times daily with a meal. 180 tablet 0   metoprolol succinate (TOPROL-XL) 100 MG 24 hr tablet TAKE 1 TABLET BY MOUTH AT BEDTIME WITH THE 25 MG TO EQUAL 125 MG ONCEDAILY 90 tablet 0   metoprolol succinate (TOPROL-XL) 25 MG 24 hr tablet TAKE 1 TABLET (25 mg) BY MOUTH WITH A 100 mg TABLET FOR A TOTAL OF (125 mg) DAILY. 90 tablet 1   montelukast (SINGULAIR) 10 MG tablet Take 1 tablet (10 mg total) by mouth at bedtime. 30 tablet 3   NONFORMULARY OR COMPOUNDED ITEM 10% Ketamine/2% Cyclobenzaprine/6% Gabapentin Cream Sig: 1-2 ml to affected area 3-4 times/day. Amount: 240 GM 240 each 2   nortriptyline (PAMELOR) 25 MG capsule Take 2 capsules (50 mg total) by mouth at bedtime. 60 capsule 3   Oxycodone HCl 10 MG TABS Take 1 tablet (10 mg total) by mouth every 6 (six) hours as needed. Must last 30 days 120 tablet 0   [START ON 11/25/2018] Oxycodone HCl 10 MG TABS Take 1 tablet (10 mg total) by mouth every 6 (six) hours as needed. Must last 30 days 120 tablet 0   [START ON 12/25/2018] Oxycodone HCl 10 MG TABS Take 1 tablet (10 mg total) by mouth every 6 (six) hours as needed. Must last 30 days 120 tablet 0   potassium chloride (KLOR-CON) 20 MEQ packet Take 20 mEq by mouth daily.     SUMAtriptan (IMITREX) 100 MG tablet TAKE ONE TABLET AT ONSET OF HEADACHE MAY REPEAT IN TWO HOURS IF NECESSARY LIMIT OF TWO IN 24 HOURS 10 tablet 12   No current facility-administered medications for this visit.     OBJECTIVE: There were no vitals filed for this visit.   There is no height or weight on file to calculate BMI.    ECOG FS:{CHL ONC [id number]  General: Well-developed, well-nourished, no acute distress. Eyes: Pink  conjunctiva, anicteric sclera. HEENT: Normocephalic, moist mucous membranes, clear oropharnyx. Lungs: Clear to auscultation bilaterally. Heart: Regular rate and rhythm. No rubs, murmurs, or gallops. Abdomen: Soft, nontender, nondistended. No organomegaly noted, normoactive bowel sounds. Musculoskeletal: No edema, cyanosis, or clubbing. Neuro: Alert, answering all questions appropriately. Cranial nerves grossly intact. Skin: No rashes or petechiae noted. Psych: Normal affect. Lymphatics: No cervical, calvicular, axillary or inguinal LAD.   LAB RESULTS:  Lab Results  Component Value Date   NA 142 10/06/2018   K 3.7 10/06/2018   CL 101 10/06/2018   CO2 25 10/06/2018   GLUCOSE 115 (H) 10/06/2018   BUN 11 10/06/2018   CREATININE 0.94 10/06/2018   CALCIUM 9.5 10/06/2018  PROT 6.9 10/06/2018   ALBUMIN 4.5 10/06/2018   AST 38 10/06/2018   ALT 41 10/06/2018   ALKPHOS 109 10/06/2018   BILITOT 0.5 10/06/2018   GFRNONAA 90 10/06/2018   GFRAA 104 10/06/2018    Lab Results  Component Value Date   WBC 10.7 (H) 12/04/2017   NEUTROABS 2.4 03/31/2017   HGB 11.8 (L) 12/04/2017   HCT 33.7 (L) 12/04/2017   MCV 90.2 12/04/2017   PLT 139 (L) 12/04/2017     STUDIES: Ct Chest Lung Cancer Screening Low Dose Wo Contrast  Result Date: 10/16/2018 CLINICAL DATA:  58 year old male with 36 pack-year history of smoking. Lung cancer screening. EXAM: CT CHEST WITHOUT CONTRAST LOW-DOSE FOR LUNG CANCER SCREENING TECHNIQUE: Multidetector CT imaging of the chest was performed following the standard protocol without IV contrast. COMPARISON:  08/21/2017 FINDINGS: Cardiovascular: The heart size is normal. No substantial pericardial effusion. Atherosclerotic calcification is noted in the wall of the thoracic aorta. Mediastinum/Nodes: No mediastinal lymphadenopathy. No evidence for gross hilar lymphadenopathy although assessment is limited by the lack of intravenous contrast on today's study. The esophagus has  normal imaging features. There is no axillary lymphadenopathy. Lungs/Pleura: Centrilobular emphsyema noted. Tiny upper lung predominant centrilobular ground-glass nodules are compatible with smoking related lung disease. Stable subpleural posterior right upper lobe pulmonary nodule since prior study. The dominant 5.6 mm posterior left lower lobe pulmonary nodule seen on the previous study has progressed in the interval, now demonstrating volume derived equivalent diameter of 10.3 mm. This nodule is not in contact with a fissure or pleural surface. No pleural effusion. Upper Abdomen: Unremarkable. Calcified granulomata noted in the spleen. Musculoskeletal: No worrisome lytic or sclerotic osseous abnormality. IMPRESSION: 1. Interval progression of a posterior left lower lobe pulmonary nodule now measuring 10.3 mm. Lung-RADS 4B, suspicious. Additional imaging evaluation or consultation with Pulmonology or Thoracic Surgery recommended. 2. No evidence for lymphadenopathy in the mediastinum or hilar regions. 3.  Aortic Atherosclerois (ICD10-170.0) 4.  Emphysema. ([id number]) These results will be called to the ordering clinician or representative by the Radiologist Assistant, and communication documented in the PACS or zVision Dashboard. Electronically Signed   By: Misty Stanley M.D.   On: 10/16/2018 13:31    ASSESSMENT: Left lower lobe pulmonary nodule  PLAN:    1. Left lower lobe pulmonary nodule:  Patient expressed understanding and was in agreement with this plan. He also understands that He can call clinic at any time with any questions, concerns, or complaints.   Cancer Staging No matching staging information was found for the patient.  [name], MD   10/26/2018 6:58 AM

## 2018-10-26 NOTE — Telephone Encounter (Signed)
There is a note at nurses station as a reminder to deliver this medication prior to allergy testing.

## 2018-10-26 NOTE — Telephone Encounter (Signed)
Debbie from [facility] called 8-20 at 4:29 to confirm his upcoming appt. 11-13-18 and wants to confirm what needs to be dropped off on 11-06-18 for that appt. (2 cc) of what needs to be tested. Please call Debbie.

## 2018-10-27 ENCOUNTER — Telehealth: Payer: Self-pay | Admitting: *Deleted

## 2018-10-27 NOTE — Telephone Encounter (Signed)
Left message with pt's significant other, Rip Harbour, to inform that pt needs to keep appt for Friday 8/28 and that his other appts for this week will be rescheduled to next week. Informed that will review his appts scheduled for next week when he comes in on Friday to be seen. Instructed to call back with any further needs or questions.

## 2018-10-28 ENCOUNTER — Other Ambulatory Visit: Admission: RE | Admit: 2018-10-28 | Payer: Medicare Other | Source: Ambulatory Visit

## 2018-10-29 ENCOUNTER — Ambulatory Visit: Payer: Medicare Other

## 2018-10-29 ENCOUNTER — Encounter: Payer: Self-pay | Admitting: *Deleted

## 2018-10-29 ENCOUNTER — Other Ambulatory Visit: Payer: Self-pay | Admitting: Family Medicine

## 2018-10-29 NOTE — Progress Notes (Signed)
  Oncology Nurse Navigator Documentation  Navigator Location: [facility] (10/29/18 1000)   )Navigator Encounter Type: Telephone (10/29/18 1000) Telephone: Incoming Call (10/29/18 1000)                       Barriers/Navigation Needs: Coordination of Care (10/29/18 1000)   Interventions: Coordination of Care (10/29/18 1000)   Coordination of Care: Appts (10/29/18 1000)     phone call received from pt's significant other that pt has had a family emergency and will not be able to make his appt in the [place] tomorrow morning and asking if his appt can be rescheduled. Appt has been rescheduled to Thursday 9/3 at 3pm to see Dr. Grayland Ormond and Dr. Patsey Berthold. Pt's significant other verbalized understanding. Nothing further needed at this time.              Time Spent with Patient: 30 (10/29/18 1000)

## 2018-10-30 ENCOUNTER — Inpatient Hospital Stay: Payer: Medicare Other | Admitting: Oncology

## 2018-10-30 ENCOUNTER — Institutional Professional Consult (permissible substitution): Payer: Medicare Other | Admitting: Internal Medicine

## 2018-10-31 NOTE — Progress Notes (Signed)
[place]  [phone] [phone]  ID: Kristopher [name] OB: April 06, 1960  MR#: [id number]  RCB#:[id number]  Patient Care Team: [name], DO as PCP - General (Family Medicine) Gavin Pound, CMA (Inactive) as Certified Medical Assistant Guadalupe Maple, MD as Attending Physician (Family Medicine) De Hollingshead, [facility] as Pharmacist (Pharmacist) Minor, Dalbert Garnet, RN as [facility], [place], RN as Registered Nurse  CHIEF COMPLAINT: Left lower lobe pulmonary nodule  INTERVAL HISTORY: Patient is a 58 year old male who was noted to have a left lower lobe nodule on low-dose CT lung cancer screening.  Repeat CT scan 1 year later revealed significant growth of nodule.  Subsequent PET scan was only minimally positive.  Currently, patient feels well and is asymptomatic.  He has no neurologic complaints.  He denies any recent fevers or illnesses.  He has good appetite and denies weight loss.  He has no chest pain, shortness of breath, cough, or hemoptysis.  He denies any nausea, vomiting, constipation, or diarrhea.  He has no urinary complaints.  Patient feels at his baseline offers no specific complaints today.  REVIEW OF SYSTEMS:   Review of Systems  Constitutional: Negative.  Negative for fever, malaise/fatigue and weight loss.  Respiratory: Negative.  Negative for cough and shortness of breath.   Cardiovascular: Negative.  Negative for chest pain and leg swelling.  Gastrointestinal: Negative.  Negative for abdominal pain.  Genitourinary: Negative.  Negative for dysuria.  Musculoskeletal: Negative.  Negative for back pain.  Skin: Negative.  Negative for rash.  Neurological: Negative.  Negative for dizziness, focal weakness, weakness and headaches.  Psychiatric/Behavioral: Negative.  The patient is not nervous/anxious.     As per HPI. Otherwise, a complete review of systems is negative.  PAST MEDICAL  HISTORY: Past Medical History:  Diagnosis Date   Allergy    Arthritis    left foot   Benign hypertensive kidney disease    Chronic back pain    Four rods in back   Diabetes mellitus, type 2 ([place])    Hypertension    Migraines    daily    PAST SURGICAL HISTORY: Past Surgical History:  Procedure Laterality Date   APPENDECTOMY     BACK SURGERY     COLONOSCOPY WITH PROPOFOL N/A 02/19/2016   Procedure: COLONOSCOPY WITH PROPOFOL;  Surgeon: Lucilla Lame, MD;  Location: [place];  Service: Endoscopy;  Laterality: N/A;   COLONOSCOPY WITH PROPOFOL N/A 01/19/2018   Procedure: COLONOSCOPY WITH PROPOFOL;  Surgeon: Lucilla Lame, MD;  Location: [place];  Service: Endoscopy;  Laterality: N/A;  Diabetic - oral meds   DG OPERATIVE LEFT HIP ([place] HX)     10/19   FOOT SURGERY Left    Screws and plates   JOINT REPLACEMENT Left 12/2017   DR Rudene Christians   KNEE SURGERY Left    X 2   LEG SURGERY     POLYPECTOMY N/A 02/19/2016   Procedure: POLYPECTOMY;  Surgeon: Lucilla Lame, MD;  Location: [place];  Service: Endoscopy;  Laterality: N/A;   POLYPECTOMY  01/19/2018   Procedure: POLYPECTOMY;  Surgeon: Lucilla Lame, MD;  Location: [place];  Service: Endoscopy;;   TOTAL HIP ARTHROPLASTY Left 12/02/2017   Procedure: TOTAL HIP ARTHROPLASTY ANTERIOR APPROACH;  Surgeon: Hessie Knows, MD;  Location: [facility];  Service: Orthopedics;  Laterality: Left;    FAMILY HISTORY: Family History  Problem Relation Age of Onset   Cancer Father  Diabetes Sister    Thrombosis Sister     ADVANCED DIRECTIVES (Y/N):  N  HEALTH MAINTENANCE: Social History   Tobacco Use   Smoking status: Current Every Day Smoker    Packs/day: 1.00    Years: 35.00    Pack years: 35.00    Types: Cigarettes   Smokeless tobacco: Never Used  Substance Use Topics   Alcohol use: No    Alcohol/week: 0.0 standard drinks   Drug use: Yes    Types: Oxycodone      Colonoscopy:  PAP:  Bone density:  Lipid panel:  Allergies  Allergen Reactions   Aspirin Anaphylaxis   Epinephrine Anaphylaxis   Lidocaine Anaphylaxis   Novocain [Procaine] Anaphylaxis   Penicillins Anaphylaxis    Has patient had a PCN reaction causing immediate rash, facial/tongue/throat swelling, SOB or lightheadedness with hypotension: Yes Has patient had a PCN reaction causing severe rash involving mucus membranes or skin necrosis: No Has patient had a PCN reaction that required hospitalization: Yes Has patient had a PCN reaction occurring within the last 10 years: No If all of the above answers are "NO", then may proceed with Cephalosporin use.    Strawberry Extract Anaphylaxis   Shellfish Allergy Nausea And Vomiting    Current Outpatient Medications  Medication Sig Dispense Refill   ACCU-CHEK GUIDE test strip USE TO TEST BLOOD SUGAR 2X A DAY     albuterol (PROVENTIL HFA;VENTOLIN HFA) 108 (90 Base) MCG/ACT inhaler Inhale 2 puffs into the lungs every 6 (six) hours as needed for wheezing or shortness of breath. 1 Inhaler 3   atorvastatin (LIPITOR) 10 MG tablet TAKE 1 TABLET BY MOUTH EVERY DAY 90 tablet 1   esomeprazole (NEXIUM) 40 MG capsule Take 1 capsule (40 mg total) by mouth daily. (Patient taking differently: Take 40 mg by mouth 3 times/day as needed-between meals & bedtime. ) 90 capsule 3   hydrALAZINE (APRESOLINE) 100 MG tablet Take 1 tablet (100 mg total) by mouth 2 (two) times daily. 180 tablet 1   metFORMIN (GLUCOPHAGE-XR) 500 MG 24 hr tablet Take 1 tablet (500 mg total) by mouth 2 (two) times daily with a meal. 180 tablet 0   metoprolol succinate (TOPROL-XL) 100 MG 24 hr tablet TAKE 1 TABLET BY MOUTH AT BEDTIME WITH THE 25 MG TO EQUAL 125 MG ONCEDAILY 90 tablet 0   metoprolol succinate (TOPROL-XL) 25 MG 24 hr tablet TAKE 1 TABLET (25 mg) BY MOUTH WITH A 100 mg TABLET FOR A TOTAL OF (125 mg) DAILY. 90 tablet 1   montelukast (SINGULAIR) 10 MG tablet  Take 1 tablet (10 mg total) by mouth at bedtime. 30 tablet 3   NONFORMULARY OR COMPOUNDED ITEM 10% Ketamine/2% Cyclobenzaprine/6% Gabapentin Cream Sig: 1-2 ml to affected area 3-4 times/day. Amount: 240 GM 240 each 2   nortriptyline (PAMELOR) 25 MG capsule Take 2 capsules (50 mg total) by mouth at bedtime. 60 capsule 3   [START ON 12/25/2018] Oxycodone HCl 10 MG TABS Take 1 tablet (10 mg total) by mouth every 6 (six) hours as needed. Must last 30 days 120 tablet 0   potassium chloride (KLOR-CON) 20 MEQ packet Take 20 mEq by mouth daily.     SUMAtriptan (IMITREX) 100 MG tablet TAKE ONE TABLET AT ONSET OF HEADACHE MAY REPEAT IN TWO HOURS IF NECESSARY LIMIT OF TWO IN 24 HOURS 10 tablet 12   Oxycodone HCl 10 MG TABS Take 1 tablet (10 mg total) by mouth every 6 (six) hours as needed. Must last 30  days 120 tablet 0   [START ON 11/25/2018] Oxycodone HCl 10 MG TABS Take 1 tablet (10 mg total) by mouth every 6 (six) hours as needed. Must last 30 days 120 tablet 0   No current facility-administered medications for this visit.     OBJECTIVE: Vitals:   11/05/18 1449  BP: (!) 145/100  Pulse: 78  Resp: 20  Temp: 99 F (37.2 C)     Body mass index is 32.55 kg/m.    ECOG FS:0 - Asymptomatic  General: Well-developed, well-nourished, no acute distress. Eyes: Pink conjunctiva, anicteric sclera. HEENT: Normocephalic, moist mucous membranes, clear oropharnyx. Lungs: Clear to auscultation bilaterally. Heart: Regular rate and rhythm. No rubs, murmurs, or gallops. Abdomen: Soft, nontender, nondistended. No organomegaly noted, normoactive bowel sounds. Musculoskeletal: No edema, cyanosis, or clubbing. Neuro: Alert, answering all questions appropriately. Cranial nerves grossly intact. Skin: No rashes or petechiae noted. Psych: Normal affect. Lymphatics: No cervical, calvicular, axillary or inguinal LAD.   LAB RESULTS:  Lab Results  Component Value Date   NA 142 10/06/2018   K 3.7 10/06/2018    CL 101 10/06/2018   CO2 25 10/06/2018   GLUCOSE 115 (H) 10/06/2018   BUN 11 10/06/2018   CREATININE 0.94 10/06/2018   CALCIUM 9.5 10/06/2018   PROT 6.9 10/06/2018   ALBUMIN 4.5 10/06/2018   AST 38 10/06/2018   ALT 41 10/06/2018   ALKPHOS 109 10/06/2018   BILITOT 0.5 10/06/2018   GFRNONAA 90 10/06/2018   GFRAA 104 10/06/2018    Lab Results  Component Value Date   WBC 10.7 (H) 12/04/2017   NEUTROABS 2.4 03/31/2017   HGB 11.8 (L) 12/04/2017   HCT 33.7 (L) 12/04/2017   MCV 90.2 12/04/2017   PLT 139 (L) 12/04/2017     STUDIES: Nm Pet Image Initial (pi) Skull Base To Thigh  Result Date: 11/04/2018 CLINICAL DATA:  Initial  treatment strategy for pulmonary nodule . EXAM: NUCLEAR MEDICINE PET SKULL BASE TO THIGH TECHNIQUE: 12.08 mCi F-18 FDG was injected intravenously. Full-ring PET imaging was performed from the skull base to thigh after the radiotracer. CT data was obtained and used for attenuation correction and anatomic localization. Fasting blood glucose: 114 mg/dl COMPARISON: CT chest 10/16/2018 FINDINGS: Mediastinal blood pool activity: SUV max 3.1 Liver activity: SUV max NA NECK: No hypermetabolic lymph nodes in the neck. Incidental CT findings: none CHEST: No hypermetabolic axillary, supraclavicular, mediastinal, or hilar lymph nodes. Superior segment left lower lobe lung nodule measures 1.5 cm and an SUV max of 2.06. No additional pulmonary nodules or masses identified. Incidental CT findings: none ABDOMEN/PELVIS: No abnormal FDG uptake identified within the liver, pancreas, spleen, or adrenal glands. No hypermetabolic abdominal or pelvic lymph nodes. Incidental CT findings: Calcified granulomas identified within the liver and spleen. Aortic atherosclerosis. SKELETON: No focal hypermetabolic activity to suggest skeletal metastasis. Incidental CT findings: Previous left hip arthroplasty. IMPRESSION: 1. There is mild FDG uptake associated with the superior segment left lower lobe lung  nodule which has an SUV max of 2.06. Cannot rule out slow growing low-grade indolent pulmonary neoplasm. 2. No FDG avid mediastinal or hilar lymph nodes or distant metastatic disease. Electronically Signed   By: Kerby Moors M.D.   On: 11/04/2018 15:11   Ct Chest Lung Cancer Screening Low Dose Wo Contrast  Result Date: 10/16/2018 CLINICAL DATA:  58 year old male with 36 pack-year history of smoking. Lung cancer screening. EXAM: CT CHEST WITHOUT CONTRAST LOW-DOSE FOR LUNG CANCER SCREENING TECHNIQUE: Multidetector CT imaging of the chest was  performed following the standard protocol without IV contrast. COMPARISON:  08/21/2017 FINDINGS: Cardiovascular: The heart size is normal. No substantial pericardial effusion. Atherosclerotic calcification is noted in the wall of the thoracic aorta. Mediastinum/Nodes: No mediastinal lymphadenopathy. No evidence for gross hilar lymphadenopathy although assessment is limited by the lack of intravenous contrast on today's study. The esophagus has normal imaging features. There is no axillary lymphadenopathy. Lungs/Pleura: Centrilobular emphsyema noted. Tiny upper lung predominant centrilobular ground-glass nodules are compatible with smoking related lung disease. Stable subpleural posterior right upper lobe pulmonary nodule since prior study. The dominant 5.6 mm posterior left lower lobe pulmonary nodule seen on the previous study has progressed in the interval, now demonstrating volume derived equivalent diameter of 10.3 mm. This nodule is not in contact with a fissure or pleural surface. No pleural effusion. Upper Abdomen: Unremarkable. Calcified granulomata noted in the spleen. Musculoskeletal: No worrisome lytic or sclerotic osseous abnormality. IMPRESSION: 1. Interval progression of a posterior left lower lobe pulmonary nodule now measuring 10.3 mm. Lung-RADS 4B, suspicious. Additional imaging evaluation or consultation with Pulmonology or Thoracic Surgery recommended. 2.  No evidence for lymphadenopathy in the mediastinum or hilar regions. 3.  Aortic Atherosclerois (ICD10-170.0) 4.  Emphysema. ([id number]) These results will be called to the ordering clinician or representative by the Radiologist Assistant, and communication documented in the PACS or zVision Dashboard. Electronically Signed   By: Misty Stanley M.D.   On: 10/16/2018 13:31    ASSESSMENT: Left lower lobe pulmonary nodule  PLAN:    1. Left lower lobe pulmonary nodule: Low-dose CT screening results from October 16, 2018 as well as PET scan results from 10/s 2020 reviewed independently and report as above with left lower lobe pulmonary nodule increasing in size over the past year, but remains minimally active on PET scan.  Case was also discussed at length at cancer conference.  Recommendation is to pursue navigational biopsy with pulmonology.  Case also discussed with pulmonology and patient has an appointment with them today as well.  If biopsy is positive for malignancy, patient will proceed to surgery and can follow-up postoperatively.  If negative, no further follow-up is necessary and continue treatment and evaluation per pulmonology.  I spent a total of 60 minutes face-to-face with the patient of which greater than 50% of the visit was spent in counseling and coordination of care as detailed above.   Patient expressed understanding and was in agreement with this plan. He also understands that He can call clinic at any time with any questions, concerns, or complaints.   Cancer Staging No matching staging information was found for the patient.  [name], MD   11/06/2018 9:52 AM

## 2018-11-02 ENCOUNTER — Other Ambulatory Visit
Admission: RE | Admit: 2018-11-02 | Discharge: 2018-11-02 | Disposition: A | Discharge status: Home / Self Care | Payer: Medicare Other | Source: Ambulatory Visit | Attending: Oncology | Admitting: Oncology

## 2018-11-02 ENCOUNTER — Other Ambulatory Visit: Payer: Self-pay

## 2018-11-02 DIAGNOSIS — Z01812 Encounter for preprocedural laboratory examination: Secondary | ICD-10-CM | POA: Diagnosis not present

## 2018-11-02 DIAGNOSIS — R911 Solitary pulmonary nodule: Secondary | ICD-10-CM | POA: Insufficient documentation

## 2018-11-02 DIAGNOSIS — Z20828 Contact with and (suspected) exposure to other viral communicable diseases: Secondary | ICD-10-CM | POA: Diagnosis not present

## 2018-11-02 LAB — SARS CORONAVIRUS 2 (TAT 6-24 HRS): SARS Coronavirus 2: NEGATIVE

## 2018-11-03 ENCOUNTER — Ambulatory Visit: Payer: Medicare Other | Attending: Oncology

## 2018-11-03 DIAGNOSIS — R911 Solitary pulmonary nodule: Secondary | ICD-10-CM | POA: Insufficient documentation

## 2018-11-04 ENCOUNTER — Other Ambulatory Visit: Payer: Self-pay

## 2018-11-04 ENCOUNTER — Encounter
Admission: RE | Admit: 2018-11-04 | Discharge: 2018-11-04 | Disposition: A | Discharge status: Home / Self Care | Payer: Medicare Other | Source: Ambulatory Visit | Attending: Oncology | Admitting: Oncology

## 2018-11-04 DIAGNOSIS — R911 Solitary pulmonary nodule: Secondary | ICD-10-CM | POA: Diagnosis not present

## 2018-11-04 LAB — GLUCOSE, CAPILLARY: Glucose-Capillary: 114 mg/dL — ABNORMAL HIGH (ref 70–99)

## 2018-11-04 MED ORDER — FLUDEOXYGLUCOSE F - 18 (FDG) INJECTION
11.7000 | Freq: Once | INTRAVENOUS | Status: AC | PRN
  Administered 2018-11-04: 12.08 via INTRAVENOUS

## 2018-11-05 ENCOUNTER — Ambulatory Visit (INDEPENDENT_AMBULATORY_CARE_PROVIDER_SITE_OTHER): Payer: Medicare Other | Admitting: Pulmonary Disease

## 2018-11-05 ENCOUNTER — Other Ambulatory Visit: Payer: Self-pay | Admitting: Pulmonary Disease

## 2018-11-05 ENCOUNTER — Inpatient Hospital Stay: Payer: Medicare Other | Attending: Oncology | Admitting: Oncology

## 2018-11-05 ENCOUNTER — Inpatient Hospital Stay: Payer: Medicare Other

## 2018-11-05 ENCOUNTER — Encounter: Payer: Self-pay | Admitting: Pulmonary Disease

## 2018-11-05 ENCOUNTER — Encounter: Payer: Self-pay | Admitting: Oncology

## 2018-11-05 ENCOUNTER — Other Ambulatory Visit: Payer: Self-pay

## 2018-11-05 ENCOUNTER — Telehealth: Payer: Self-pay | Admitting: Pulmonary Disease

## 2018-11-05 VITALS — BP 145/100 | HR 78 | Temp 99.0°F | Ht 70.0 in | Wt 227.0 lb

## 2018-11-05 VITALS — BP 145/100 | HR 78 | Temp 99.0°F | Resp 20 | Ht 70.08 in | Wt 227.4 lb

## 2018-11-05 DIAGNOSIS — Z96642 Presence of left artificial hip joint: Secondary | ICD-10-CM | POA: Diagnosis not present

## 2018-11-05 DIAGNOSIS — Z79899 Other long term (current) drug therapy: Secondary | ICD-10-CM | POA: Insufficient documentation

## 2018-11-05 DIAGNOSIS — I10 Essential (primary) hypertension: Secondary | ICD-10-CM | POA: Diagnosis not present

## 2018-11-05 DIAGNOSIS — I129 Hypertensive chronic kidney disease with stage 1 through stage 4 chronic kidney disease, or unspecified chronic kidney disease: Secondary | ICD-10-CM | POA: Insufficient documentation

## 2018-11-05 DIAGNOSIS — K219 Gastro-esophageal reflux disease without esophagitis: Secondary | ICD-10-CM

## 2018-11-05 DIAGNOSIS — F17210 Nicotine dependence, cigarettes, uncomplicated: Secondary | ICD-10-CM | POA: Diagnosis not present

## 2018-11-05 DIAGNOSIS — G8929 Other chronic pain: Secondary | ICD-10-CM | POA: Insufficient documentation

## 2018-11-05 DIAGNOSIS — Z880 Allergy status to penicillin: Secondary | ICD-10-CM | POA: Insufficient documentation

## 2018-11-05 DIAGNOSIS — R911 Solitary pulmonary nodule: Secondary | ICD-10-CM

## 2018-11-05 DIAGNOSIS — Z7984 Long term (current) use of oral hypoglycemic drugs: Secondary | ICD-10-CM | POA: Diagnosis not present

## 2018-11-05 DIAGNOSIS — Z8249 Family history of ischemic heart disease and other diseases of the circulatory system: Secondary | ICD-10-CM | POA: Insufficient documentation

## 2018-11-05 DIAGNOSIS — Z886 Allergy status to analgesic agent status: Secondary | ICD-10-CM | POA: Insufficient documentation

## 2018-11-05 DIAGNOSIS — Z809 Family history of malignant neoplasm, unspecified: Secondary | ICD-10-CM | POA: Insufficient documentation

## 2018-11-05 DIAGNOSIS — E1122 Type 2 diabetes mellitus with diabetic chronic kidney disease: Secondary | ICD-10-CM | POA: Insufficient documentation

## 2018-11-05 DIAGNOSIS — I700 Atherosclerosis of aorta: Secondary | ICD-10-CM | POA: Diagnosis not present

## 2018-11-05 DIAGNOSIS — N181 Chronic kidney disease, stage 1: Secondary | ICD-10-CM | POA: Insufficient documentation

## 2018-11-05 DIAGNOSIS — Z833 Family history of diabetes mellitus: Secondary | ICD-10-CM

## 2018-11-05 DIAGNOSIS — M549 Dorsalgia, unspecified: Secondary | ICD-10-CM | POA: Diagnosis not present

## 2018-11-05 DIAGNOSIS — J439 Emphysema, unspecified: Secondary | ICD-10-CM | POA: Insufficient documentation

## 2018-11-05 NOTE — Progress Notes (Signed)
Patient here for initial evaluation of lund nodule that has increased in size.  Reports that he felt "funny" yesterday after the PET scan.  BP is elevated today at 150/100 and he states that is good for him.  He did take his bp meds today.

## 2018-11-05 NOTE — Telephone Encounter (Signed)
Bronch with nav has been scheduled for 11/18/2018 at 10:00a. Pt is aware of date/time of bronch.  Will await confirmation for PAT.  [id number] DH:WYSH nodule.

## 2018-11-05 NOTE — Telephone Encounter (Signed)
PAT is scheduled for 11/13/2018 at 12:45 with covid test to follow.  Pt is aware of date/time and voiced his understanding.

## 2018-11-05 NOTE — H&P (View-Only) (Signed)
Subjective:    Patient ID: Kristopher [name], male    DOB: [birth year], 58 y.o.   MRN: [id number]  HPI The patient is a 58 year old current smoker (1 PPD) who presents for evaluation of a LEFT lower lobe nodule.  The patient is referred by Dr. Park Liter he is seen at the multidisciplinary cancer clinic in conjunction with Dr. Delight Hoh.  The patient has been undergoing low-dose CT lung cancer screening as he has met criteria with his smoking history.  He underwent initial low-dose CT on August 21, 2017.  At that time this showed bilateral pulmonary nodules the largest of which appear to be 5.6 mm.  This was located on the superior segment of the left lower lobe.  Sequently had follow-up CT on 16 October 2018, he was overdue however the delay was due to COVID-19.  This time the nodule in question measuring 10.3 mm with apparent bronchograms through it.  A PET/CT was obtained on 2 September and this showed an SUV max in this nodule of 2.06.  A slow-growing indolent pulmonary neoplasm cannot be excluded.  The patient has been asymptomatic in this regard.  He has had no cough, hemoptysis, sputum production, fevers, chills or sweats.  He has good appetite, no weight loss.  He has had no chest pain, orthopnea, paroxysmal nocturnal dyspnea or lower extremity edema.  He does not endorse dyspnea.  He tries to stay active.  He voices no other complaint.  Aside from the studies noted above, patient also underwent pulmonary function testing on 1 September and these were normal.  Past medical history, surgical history and family history have been reviewed.  There is an extensive history of various cancers in the family.  He previously resided in [place].  He moved to this area approximately 4 years ago.  He smokes a pack of cigarettes per day and has done so for 35 years.  He has no military history.  He has known exposure to asbestos and prior employment.  Review of Systems  Constitutional: Negative.    HENT: Negative.   Eyes: Negative.   Respiratory: Negative.   Cardiovascular: Negative.   Gastrointestinal:       Does endorse gastroesophageal reflux symptoms.  Endocrine: Negative.   Genitourinary: Negative.   Musculoskeletal: Negative.   Skin: Negative.   Allergic/Immunologic: Negative.   Neurological: Negative.   Hematological: Negative.   Psychiatric/Behavioral: Negative.   All other systems reviewed and are negative.      Objective:   Physical Exam Vitals signs (Patient notes he is somewhat anxious BP elevated) and nursing note reviewed.  Constitutional:      Appearance: Normal appearance. He is overweight. He is not ill-appearing.  HENT:     Head: Normocephalic and atraumatic.     Right Ear: External ear normal.     Left Ear: External ear normal.     Nose:     Comments: Nose/mouth/throat not examined due to masking requirements for COVID 19. Eyes:     General: No scleral icterus.    Conjunctiva/sclera: Conjunctivae normal.     Pupils: Pupils are equal, round, and reactive to light.  Neck:     Musculoskeletal: Normal range of motion and neck supple.     Trachea: Trachea and phonation normal.  Cardiovascular:     Rate and Rhythm: Normal rate and regular rhythm.     Pulses: Normal pulses.     Heart sounds: Normal heart sounds.  Pulmonary:     Effort:  Pulmonary effort is normal.     Breath sounds: Normal breath sounds.  Abdominal:     General: There is no distension.     Palpations: Abdomen is soft.     Tenderness: There is no abdominal tenderness.  Musculoskeletal: Normal range of motion.     Right lower leg: No edema.     Left lower leg: No edema.  Skin:    General: Skin is warm and dry.  Neurological:     General: No focal deficit present.     Mental Status: He is alert and oriented to person, place, and time.  Psychiatric:        Mood and Affect: Mood normal.        Behavior: Behavior normal.     Low dose CT 08/21/2017:   Low dose CT 10/16/2018:    Images above were reviewed independently also reviewed with the patient.  On the most recent CT scan for lung cancer screening the posterior LEFT lower lobe superior segment nodule now measures 1.3 cm previously this measured 0.56 cm.  PET/CT did not show significant avidity however, a slow growing carcinoma cannot be excluded.  Assessment & Plan:   1.  Left lower lobe superior segment nodule: FDG avidity is low however the nodule has doubled in size over the course of a year.  Recommend attempt at biopsy with navigational bronchoscopy.  The patient had the procedure explained to him and he understands that this will be done under general anesthesia.  Benefits, limitations and potential complications of the procedure were discussed with the patient/family  including, but not limited to bleeding, hemoptysis, respiratory failure requiring intubation and/or prolongued mechanical ventilation, infection, pneumothorax (collapse of lung) requiring chest tube placement, stroke from air embolism or even death.  Patient agrees to proceed.  Procedure has been scheduled tentatively for 16 September at 10 AM.  The patient will have a super dimension CT performed prior so that a navigation plan can be developed.  2.  Essential hypertension: Patient's diastolic blood pressure was noted elevated today.  He states that he felt "nervous" with regards to the visit.  He will discuss with his primary care physician in this regard.  Continue his antihypertensives.  3.  Gastroesophageal reflux disease: This issue adds complexity to his management.  4.  Tobacco dependence due to cigarettes: Patient was counseled regards to discontinuation of smoking total time 3 to 5 minutes.   Thank you for allowing me to participate in this gentlemen's care.   This chart was dictated using voice recognition software/Dragon.  Despite best efforts to proofread, errors can occur which can change the meaning.  Any change was purely  unintentional.

## 2018-11-05 NOTE — Progress Notes (Signed)
Tumor Board Documentation  Kristopher [name] was presented by Norvel Richards, RN at our Tumor Board on 11/05/2018, which included representatives from medical oncology, radiation oncology, pathology, radiology, surgical, navigation, internal medicine, pharmacy, pulmonology, palliative care, research.  Kristopher [name] currently presents as a current patient, for discussion with history of the following treatments: active survellience.  Additionally, we reviewed previous medical and familial history, history of present illness, and recent lab results along with all available histopathologic and imaging studies. The tumor board considered available treatment options and made the following recommendations: Biopsy Bronchscopic biopsy with cultures  The following procedures/referrals were also placed: No orders of the defined types were placed in this encounter.   Clinical Trial Status: not discussed   Staging used: To be determined  National site-specific guidelines   were discussed with respect to the case.  Tumor board is a meeting of clinicians from various specialty areas who evaluate and discuss patients for whom a multidisciplinary approach is being considered. Final determinations in the plan of care are those of the provider(s). The responsibility for follow up of recommendations given during tumor board is that of the provider.   Today's extended care, comprehensive team conference, Kristopher [name] was not present for the discussion and was not examined.   Multidisciplinary Tumor Board is a multidisciplinary case peer review process.  Decisions discussed in the Multidisciplinary Tumor Board reflect the opinions of the specialists present at the conference without having examined the patient.  Ultimately, treatment and diagnostic decisions rest with the primary provider(s) and the patient.

## 2018-11-05 NOTE — Patient Instructions (Signed)
We will schedule you for a CT scan to develop a map of your airway.  We will schedule you for your procedure on 16 September at 10 AM, you will be seeing the preoperative clinic prior to that.  You will hear from them.

## 2018-11-05 NOTE — Progress Notes (Signed)
Subjective:    Patient ID: Kristopher [name], male    DOB: 1960-08-21, 58 y.o.   MRN: [id number]  HPI The patient is a 58 year old current smoker (1 PPD) who presents for evaluation of a LEFT lower lobe nodule.  The patient is referred by Dr. Park Liter he is seen at the multidisciplinary cancer clinic in conjunction with Dr. Delight Hoh.  The patient has been undergoing low-dose CT lung cancer screening as he has met criteria with his smoking history.  He underwent initial low-dose CT on August 21, 2017.  At that time this showed bilateral pulmonary nodules the largest of which appear to be 5.6 mm.  This was located on the superior segment of the left lower lobe.  Sequently had follow-up CT on 16 October 2018, he was overdue however the delay was due to COVID-19.  This time the nodule in question measuring 10.3 mm with apparent bronchograms through it.  A PET/CT was obtained on 2 September and this showed an SUV max in this nodule of 2.06.  A slow-growing indolent pulmonary neoplasm cannot be excluded.  The patient has been asymptomatic in this regard.  He has had no cough, hemoptysis, sputum production, fevers, chills or sweats.  He has good appetite, no weight loss.  He has had no chest pain, orthopnea, paroxysmal nocturnal dyspnea or lower extremity edema.  He does not endorse dyspnea.  He tries to stay active.  He voices no other complaint.  Aside from the studies noted above, patient also underwent pulmonary function testing on 1 September and these were normal.  Past medical history, surgical history and family history have been reviewed.  There is an extensive history of various cancers in the family.  He previously resided in [place].  He moved to this area approximately 4 years ago.  He smokes a pack of cigarettes per day and has done so for 35 years.  He has no military history.  He has known exposure to asbestos and prior employment.  Review of Systems  Constitutional: Negative.    HENT: Negative.   Eyes: Negative.   Respiratory: Negative.   Cardiovascular: Negative.   Gastrointestinal:       Does endorse gastroesophageal reflux symptoms.  Endocrine: Negative.   Genitourinary: Negative.   Musculoskeletal: Negative.   Skin: Negative.   Allergic/Immunologic: Negative.   Neurological: Negative.   Hematological: Negative.   Psychiatric/Behavioral: Negative.   All other systems reviewed and are negative.      Objective:   Physical Exam Vitals signs (Patient notes he is somewhat anxious BP elevated) and nursing note reviewed.  Constitutional:      Appearance: Normal appearance. He is overweight. He is not ill-appearing.  HENT:     Head: Normocephalic and atraumatic.     Right Ear: External ear normal.     Left Ear: External ear normal.     Nose:     Comments: Nose/mouth/throat not examined due to masking requirements for COVID 19. Eyes:     General: No scleral icterus.    Conjunctiva/sclera: Conjunctivae normal.     Pupils: Pupils are equal, round, and reactive to light.  Neck:     Musculoskeletal: Normal range of motion and neck supple.     Trachea: Trachea and phonation normal.  Cardiovascular:     Rate and Rhythm: Normal rate and regular rhythm.     Pulses: Normal pulses.     Heart sounds: Normal heart sounds.  Pulmonary:     Effort:  Pulmonary effort is normal.     Breath sounds: Normal breath sounds.  Abdominal:     General: There is no distension.     Palpations: Abdomen is soft.     Tenderness: There is no abdominal tenderness.  Musculoskeletal: Normal range of motion.     Right lower leg: No edema.     Left lower leg: No edema.  Skin:    General: Skin is warm and dry.  Neurological:     General: No focal deficit present.     Mental Status: He is alert and oriented to person, place, and time.  Psychiatric:        Mood and Affect: Mood normal.        Behavior: Behavior normal.     Low dose CT 08/21/2017:   Low dose CT 10/16/2018:    Images above were reviewed independently also reviewed with the patient.  On the most recent CT scan for lung cancer screening the posterior LEFT lower lobe superior segment nodule now measures 1.3 cm previously this measured 0.56 cm.  PET/CT did not show significant avidity however, a slow growing carcinoma cannot be excluded.  Assessment & Plan:   1.  Left lower lobe superior segment nodule: FDG avidity is low however the nodule has doubled in size over the course of a year.  Recommend attempt at biopsy with navigational bronchoscopy.  The patient had the procedure explained to him and he understands that this will be done under general anesthesia.  Benefits, limitations and potential complications of the procedure were discussed with the patient/family  including, but not limited to bleeding, hemoptysis, respiratory failure requiring intubation and/or prolongued mechanical ventilation, infection, pneumothorax (collapse of lung) requiring chest tube placement, stroke from air embolism or even death.  Patient agrees to proceed.  Procedure has been scheduled tentatively for 16 September at 10 AM.  The patient will have a super dimension CT performed prior so that a navigation plan can be developed.  2.  Essential hypertension: Patient's diastolic blood pressure was noted elevated today.  He states that he felt "nervous" with regards to the visit.  He will discuss with his primary care physician in this regard.  Continue his antihypertensives.  3.  Gastroesophageal reflux disease: This issue adds complexity to his management.  4.  Tobacco dependence due to cigarettes: Patient was counseled regards to discontinuation of smoking total time 3 to 5 minutes.   Thank you for allowing me to participate in this gentlemen's care.   This chart was dictated using voice recognition software/Dragon.  Despite best efforts to proofread, errors can occur which can change the meaning.  Any change was purely  unintentional.

## 2018-11-06 ENCOUNTER — Other Ambulatory Visit: Payer: Self-pay | Admitting: Family Medicine

## 2018-11-06 NOTE — Telephone Encounter (Signed)
Called and spoke with Meach E at [facility]. Procedure code [id number] is a valid and billable code which does not require PA. Call Ref # [id number].Rhonda J Cobb

## 2018-11-10 ENCOUNTER — Encounter: Payer: Self-pay | Admitting: *Deleted

## 2018-11-10 ENCOUNTER — Telehealth: Payer: Self-pay

## 2018-11-10 ENCOUNTER — Telehealth: Payer: Self-pay | Admitting: *Deleted

## 2018-11-10 NOTE — Progress Notes (Signed)
  Oncology Nurse Navigator Documentation  Navigator Location: [facility] (11/10/18 1300)   )Navigator Encounter Type: Telephone (11/10/18 1300) Telephone: Outgoing Call (11/10/18 1300)                       Barriers/Navigation Needs: No Barriers At This Time (11/10/18 1300)   Interventions: None Required (11/10/18 1300)    follow up phone call made to pt's significant other, Melinda. All questions answered during phone call. Reviewed upcoming appts. Instructed that if has any further questions or needs to give me a call back. Rip Harbour verbalized understanding. Nothing further needed at this time.                   Time Spent with Patient: 30 (11/10/18 1300)

## 2018-11-10 NOTE — Telephone Encounter (Signed)
[place], [place] called to remind [place] to bring Naropin 0.2% and Lidocaine 2% for this patient's allergy testing.  Will drop by their office this afternoon.

## 2018-11-11 ENCOUNTER — Ambulatory Visit: Payer: Self-pay | Admitting: Pharmacist

## 2018-11-11 ENCOUNTER — Ambulatory Visit (INDEPENDENT_AMBULATORY_CARE_PROVIDER_SITE_OTHER): Payer: Medicare Other | Admitting: *Deleted

## 2018-11-11 DIAGNOSIS — J301 Allergic rhinitis due to pollen: Secondary | ICD-10-CM

## 2018-11-11 DIAGNOSIS — I129 Hypertensive chronic kidney disease with stage 1 through stage 4 chronic kidney disease, or unspecified chronic kidney disease: Secondary | ICD-10-CM

## 2018-11-11 DIAGNOSIS — M25562 Pain in left knee: Secondary | ICD-10-CM

## 2018-11-11 DIAGNOSIS — G43009 Migraine without aura, not intractable, without status migrainosus: Secondary | ICD-10-CM

## 2018-11-11 DIAGNOSIS — M25561 Pain in right knee: Secondary | ICD-10-CM

## 2018-11-11 NOTE — Chronic Care Management (AMB) (Signed)
Chronic Care Management   Follow Up Note   11/11/2018 Name: [name] MRN: [id number] DOB: [birth year]  Referred by: [name], DO Reason for referral : Chronic Care Management (Medication Management)   Kristopher [name] is a 58 y.o. year old male who is a primary care patient of [name], DO. The CCM team was consulted for assistance with chronic disease management and care coordination needs.    Review of patient status, including review of consultants reports, relevant laboratory and other test results, and collaboration with appropriate care team members and the patient's provider was performed as part of comprehensive patient evaluation and provision of chronic care management services.    SDOH (Social Determinants of Health) screening performed today: Tobacco Use. See Care Plan for related entries.   Outpatient Encounter Medications as of 11/11/2018  Medication Sig Note  . ACCU-CHEK GUIDE test strip USE TO TEST BLOOD SUGAR 2X A DAY   . hydrALAZINE (APRESOLINE) 100 MG tablet Take 1 tablet (100 mg total) by mouth 2 (two) times daily.   . metoprolol succinate (TOPROL-XL) 100 MG 24 hr tablet TAKE 1 TABLET BY MOUTH AT BEDTIME WITH THE 25 MG TO EQUAL 125 MG ONCEDAILY (Patient taking differently: Take 100-125 mg by mouth See admin instructions. 125 mg in thr morning, and 100 mg in the evening)   . metoprolol succinate (TOPROL-XL) 25 MG 24 hr tablet TAKE 1 TABLET (25 mg) BY MOUTH WITH A 100 mg TABLET FOR A TOTAL OF (125 mg) DAILY. (Patient taking differently: Take 25 mg by mouth See admin instructions. 125 mg in thr morning, and 100 mg in the evening)   . montelukast (SINGULAIR) 10 MG tablet Take 1 tablet (10 mg total) by mouth at bedtime.   . NONFORMULARY OR COMPOUNDED ITEM 10% Ketamine/2% Cyclobenzaprine/6% Gabapentin Cream Sig: 1-2 ml to affected area 3-4 times/day. Amount: 240 GM   . nortriptyline (PAMELOR) 25 MG capsule Take 2 capsules (50 mg total) by mouth at  bedtime.   . Oxycodone HCl 10 MG TABS Take 1 tablet (10 mg total) by mouth every 6 (six) hours as needed. Must last 30 days (Patient taking differently: Take 10 mg by mouth 4 (four) times daily. Must last 30 days)   . SUMAtriptan (IMITREX) 100 MG tablet TAKE ONE TABLET AT ONSET OF HEADACHE MAY REPEAT IN TWO HOURS IF NECESSARY LIMIT OF TWO IN 24 HOURS 08/25/2018: Taking PRN  . albuterol (PROVENTIL HFA;VENTOLIN HFA) 108 (90 Base) MCG/ACT inhaler Inhale 2 puffs into the lungs every 6 (six) hours as needed for wheezing or shortness of breath.   [name] atorvastatin (LIPITOR) 10 MG tablet TAKE 1 TABLET BY MOUTH EVERY DAY   . esomeprazole (NEXIUM) 40 MG capsule Take 1 capsule (40 mg total) by mouth daily. (Patient taking differently: Take 40 mg by mouth 3 times/day as needed-between meals & bedtime. ) 08/14/2018: QAM, sometimes BID if acidic supper   . metFORMIN (GLUCOPHAGE-XR) 500 MG 24 hr tablet Take 1 tablet (500 mg total) by mouth 2 (two) times daily with a meal.   . [START ON 11/25/2018] Oxycodone HCl 10 MG TABS Take 1 tablet (10 mg total) by mouth every 6 (six) hours as needed. Must last 30 days   . [START ON 12/25/2018] Oxycodone HCl 10 MG TABS Take 1 tablet (10 mg total) by mouth every 6 (six) hours as needed. Must last 30 days   . potassium chloride (KLOR-CON) 20 MEQ packet Take 20 mEq by mouth daily.  No facility-administered encounter medications on file as of 11/11/2018.      Goals Addressed            This Visit's Progress     Patient Stated   . PharmD "I don't like taking medications" (pt-stated)       Current Barriers:  . Uncontrolled hypertension, complicated by smoking, questionable lung nodule, chronic headaches o Currently smoking ~0.75 ppd, down from 1 ppd recently. Patient is unconcerned about lung nodule work up in relation to his smoking history.  o Pre contemplative stage. Notes he was on "a pill that made me crazy" before, does recognize the name Chantix  . Current  antihypertensive regimen: metoprolol succinate 125 mg QAM, 100 mg QPM, hydralazine 100 mg QPM (is not taking the AM dose, was unaware it was a scheduled medication) . Current home BP readings: 856-314H systolic; though patient notes wide fluctuations to SBP of 120 occasionally  . Notes significant headaches often relating to hypertension, though this has been a chronic problem o Notes that sumitriptan makes him "groggy and drooling" o Previously failed amitriptyline as PPX therapy. Currently on metoprolol, which may provide some headache reduction benefit. Would avoid venlafaxine d/t HTN.  . Does note that he has stopped his overuse of diphenhydramine; is instead taking cetirizine daily with moderate improvement in allergic symptoms. Per refill history, only filled montelukast once. He does not have it at home with him right now. Notes continued allergic eye symptoms.   Pharmacist Clinical Goal(s):  [name] Over the next 90 days, patient will work with PharmD and providers to optimize antihypertensive regimen  Interventions: . Comprehensive medication review performed; medication list updated in the electronic medical record.  . Reviewed risk of continued tobacco use, even if lung nodule is benign. Patient is in a pre-contemplative stage. Discussed that if he didn't tolerate Chantix, I would recommend combo NRT patch + gum/lozenge. Discussed [place] Quitline; encouraged patient to research 1-800-QUIT-NOW and contact the group for counseling and support.  . Reviewed antihypertensives. Encouraged patient to take hydralazine BID as prescribed. Explained 12 hour duration of action of hydralazine likely why some BP readings are high (no hydralazine) vs at goal (after hydralazine). Patient verbalizes understanding. Reiterated to patient and another person in his home (that he identified as his girl) that patient is to take metoprolol succinate 125 mg QAM, 100 mg QPM, and hydralazine 100 mg BID. He verbalized  understanding . Encouraged to refill montelukast and take in combination with Zyrtec daily. If allergic symptoms persist, could try OTC allergic eye drops vs fluticasone nasal spray. Patient notes that he doesn't like eye drops.  . For m, as BP control improves, if migraines persist, could consider CGRP antagonist injectable for ppx, or referral to neurology for this.   Patient Self Care Activities:  . Patient will continue to check BP daily , document, and provide at future appointments . Patient will focus on medication adherence  Please see past updates related to this goal by clicking on the "Past Updates" button in the selected goal         Plan:  - CCM team member will outreach patient in the next 4-6 weeks for continued medication management  Catie Darnelle Maffucci, PharmD Clinical Pharmacist [place] [phone]

## 2018-11-11 NOTE — Patient Instructions (Signed)
Thank you allowing the Chronic Care Management Team to be a part of your care! It was a pleasure speaking with you today!  CCM (Chronic Care Management) Team   Corretta Munce RN, BSN Nurse Care Coordinator  [phone]  Catie [facility] PharmD  Clinical Pharmacist  [phone]  Eula Fried LCSW Clinical Social Worker [phone]  Goals Addressed            This Visit's Progress   . RNCM- I need to stay healthy for my granddaughter (pt-stated)       Current Barriers:  [name] Knowledge Deficits related to basic understanding of hypertension pathophysiology and self care management . Knowledge Deficits related to understanding of medications prescribed for management of hypertension . Patient continues to smoke 1.5 packs a day . Exercise program limited by need of a new knee brace  Case Manager Clinical Goal(s):  [name] Over the next 90 days, patient will verbalize understanding of plan for hypertension management  Interventions:  . Evaluation of current treatment plan related to hypertension self management and patient's adherence to plan as established by provider. . Discussed plans with patient for ongoing care management follow up and provided patient with direct contact information for care management team . Reviewed scheduled/upcoming provider appointments including: CT 9/15 and Ortho Appt with Dr. Rudene Christians 9/16 . Discussed Tobacco cessation- Patient reports he is thinking about it.  . Collaboration with CCM pharmacist and PCP, noted patient was lost to cardiologist f/u post Hip Replacement surgery, will work on obtaining a new appointment related to continued reported high b/p with headaches.- Patient would like to work on this after he obtains results from cancer screenings.  . Continuing to monitor b/p's, discussed with Pharm-D b/p numbers are high at times.  . Discussed with patient mood and coping through his cancer screening. Patient reports it gets on his mind related to  extensive family history of CA. Reports overall he is coping well and hoping for the best.   Patient Self Care Activities:  . UNABLE to independently manage HTN as evidenced by reported headaches and high b/p numbers.  . Checks BP and records as discussed  Please see past updates related to this goal by clicking on the "Past Updates" button in the selected goal          The patient verbalized understanding of instructions provided today and declined a print copy of patient instruction materials.   The patient has been provided with contact information for the care management team and has been advised to call with any health related questions or concerns.

## 2018-11-11 NOTE — Patient Instructions (Signed)
Visit Information  Goals Addressed            This Visit's Progress     Patient Stated   . PharmD "I don't like taking medications" (pt-stated)       Current Barriers:  . Uncontrolled hypertension, complicated by smoking, questionable lung nodule, chronic headaches o Currently smoking ~0.75 ppd, down from 1 ppd recently. Patient is unconcerned about lung nodule work up in relation to his smoking history.  o Pre contemplative stage. Notes he was on "a pill that made me crazy" before, does recognize the name Chantix  . Current antihypertensive regimen: metoprolol succinate 125 mg QAM, 100 mg QPM, hydralazine 100 mg QPM (is not taking the AM dose, was unaware it was a scheduled medication) . Current home BP readings: 389-373S systolic; though patient notes wide fluctuations to SBP of 120 occasionally  . Notes significant headaches often relating to hypertension, though this has been a chronic problem o Notes that sumitriptan makes him "groggy and drooling" o Previously failed amitriptyline as PPX therapy. Currently on metoprolol, which may provide some headache reduction benefit. Would avoid venlafaxine d/t HTN.  . Does note that he has stopped his overuse of diphenhydramine; is instead taking cetirizine daily with moderate improvement in allergic symptoms. Per refill history, only filled montelukast once. He does not have it at home with him right now. Notes continued allergic eye symptoms.   Pharmacist Clinical Goal(s):  [name] Over the next 90 days, patient will work with PharmD and providers to optimize antihypertensive regimen  Interventions: . Comprehensive medication review performed; medication list updated in the electronic medical record.  . Reviewed risk of continued tobacco use, even if lung nodule is benign. Patient is in a pre-contemplative stage. Discussed that if he didn't tolerate Chantix, I would recommend combo NRT patch + gum/lozenge. Discussed [place] Quitline; encouraged patient  to research 1-800-QUIT-NOW and contact the group for counseling and support.  . Reviewed antihypertensives. Encouraged patient to take hydralazine BID as prescribed. Explained 12 hour duration of action of hydralazine likely why some BP readings are high (no hydralazine) vs at goal (after hydralazine). Patient verbalizes understanding. Reiterated to patient and another person in his home (that he identified as his girl) that patient is to take metoprolol succinate 125 mg QAM, 100 mg QPM, and hydralazine 100 mg BID. He verbalized understanding . Encouraged to refill montelukast and take in combination with Zyrtec daily. If allergic symptoms persist, could try OTC allergic eye drops vs fluticasone nasal spray. Patient notes that he doesn't like eye drops.  . For m, as BP control improves, if migraines persist, could consider CGRP antagonist injectable for ppx, or referral to neurology for this.   Patient Self Care Activities:  . Patient will continue to check BP daily , document, and provide at future appointments . Patient will focus on medication adherence  Please see past updates related to this goal by clicking on the "Past Updates" button in the selected goal         Print copy of patient instructions provided.   Plan:  - CCM team member will outreach patient in the next 4-6 weeks for continued medication management  Catie Darnelle Maffucci, PharmD Clinical Pharmacist [place] [phone]

## 2018-11-11 NOTE — Chronic Care Management (AMB) (Signed)
Chronic Care Management   Follow Up Note   11/11/2018 Name: Kristopher [name]: [id number] DOB: [birth year]  Referred by: [name], DO Reason for referral : No chief complaint on file.   Kristopher [name] is a 58 y.o. year old male who is a primary care patient of [name], DO. The CCM team was consulted for assistance with chronic disease management and care coordination needs.    Review of patient status, including review of consultants reports, relevant laboratory and other test results, and collaboration with appropriate care team members and the patient's provider was performed as part of comprehensive patient evaluation and provision of chronic care management services.    SDOH (Social Determinants of Health) screening performed today: Tobacco Use Stress. See Care Plan for related entries.   Outpatient Encounter Medications as of 11/11/2018  Medication Sig Note  . ACCU-CHEK GUIDE test strip USE TO TEST BLOOD SUGAR 2X A DAY   . albuterol (PROVENTIL HFA;VENTOLIN HFA) 108 (90 Base) MCG/ACT inhaler Inhale 2 puffs into the lungs every 6 (six) hours as needed for wheezing or shortness of breath.   [name] atorvastatin (LIPITOR) 10 MG tablet TAKE 1 TABLET BY MOUTH EVERY DAY   . esomeprazole (NEXIUM) 40 MG capsule Take 1 capsule (40 mg total) by mouth daily. (Patient taking differently: Take 40 mg by mouth 3 times/day as needed-between meals & bedtime. ) 08/14/2018: QAM, sometimes BID if acidic supper   . hydrALAZINE (APRESOLINE) 100 MG tablet Take 1 tablet (100 mg total) by mouth 2 (two) times daily.   . metFORMIN (GLUCOPHAGE-XR) 500 MG 24 hr tablet Take 1 tablet (500 mg total) by mouth 2 (two) times daily with a meal.   . metoprolol succinate (TOPROL-XL) 100 MG 24 hr tablet TAKE 1 TABLET BY MOUTH AT BEDTIME WITH THE 25 MG TO EQUAL 125 MG ONCEDAILY (Patient taking differently: Take 100-125 mg by mouth See admin instructions. 125 mg in thr morning, and 100 mg in the evening)   .  metoprolol succinate (TOPROL-XL) 25 MG 24 hr tablet TAKE 1 TABLET (25 mg) BY MOUTH WITH A 100 mg TABLET FOR A TOTAL OF (125 mg) DAILY. (Patient taking differently: Take 25 mg by mouth See admin instructions. 125 mg in thr morning, and 100 mg in the evening)   . montelukast (SINGULAIR) 10 MG tablet Take 1 tablet (10 mg total) by mouth at bedtime.   . NONFORMULARY OR COMPOUNDED ITEM 10% Ketamine/2% Cyclobenzaprine/6% Gabapentin Cream Sig: 1-2 ml to affected area 3-4 times/day. Amount: 240 GM   . nortriptyline (PAMELOR) 25 MG capsule Take 2 capsules (50 mg total) by mouth at bedtime.   . Oxycodone HCl 10 MG TABS Take 1 tablet (10 mg total) by mouth every 6 (six) hours as needed. Must last 30 days (Patient taking differently: Take 10 mg by mouth 4 (four) times daily. Must last 30 days)   . [START ON 11/25/2018] Oxycodone HCl 10 MG TABS Take 1 tablet (10 mg total) by mouth every 6 (six) hours as needed. Must last 30 days   . [START ON 12/25/2018] Oxycodone HCl 10 MG TABS Take 1 tablet (10 mg total) by mouth every 6 (six) hours as needed. Must last 30 days   . potassium chloride (KLOR-CON) 20 MEQ packet Take 20 mEq by mouth daily.   . SUMAtriptan (IMITREX) 100 MG tablet TAKE ONE TABLET AT ONSET OF HEADACHE MAY REPEAT IN TWO HOURS IF NECESSARY LIMIT OF TWO IN 24 HOURS 08/25/2018: Taking PRN  No facility-administered encounter medications on file as of 11/11/2018.      Goals Addressed            This Visit's Progress   . RNCM- I need to stay healthy for my granddaughter (pt-stated)       Current Barriers:  [name] Knowledge Deficits related to basic understanding of hypertension pathophysiology and self care management . Knowledge Deficits related to understanding of medications prescribed for management of hypertension . Patient continues to smoke 1.5 packs a day . Exercise program limited by need of a new knee brace  Case Manager Clinical Goal(s):  [name] Over the next 90 days, patient will verbalize  understanding of plan for hypertension management  Interventions:  . Evaluation of current treatment plan related to hypertension self management and patient's adherence to plan as established by provider. . Discussed plans with patient for ongoing care management follow up and provided patient with direct contact information for care management team . Reviewed scheduled/upcoming provider appointments including: CT 9/15 and Ortho Appt with Dr. Rudene Christians 9/16 . Discussed Tobacco cessation- Patient reports he is thinking about it.  . Collaboration with CCM pharmacist and PCP, noted patient was lost to cardiologist f/u post Hip Replacement surgery, will work on obtaining a new appointment related to continued reported high b/p with headaches.- Patient would like to work on this after he obtains results from cancer screenings.  . Continuing to monitor b/p's, discussed with Pharm-D b/p numbers are high at times.  . Discussed with patient mood and coping through his cancer screening. Patient reports it gets on his mind related to extensive family history of CA. Reports overall he is coping well and hoping for the best.   Patient Self Care Activities:  . UNABLE to independently manage HTN as evidenced by reported headaches and high b/p numbers.  . Checks BP and records as discussed  Please see past updates related to this goal by clicking on the "Past Updates" button in the selected goal           The care management team will reach out to the patient again over the next 30 days.    [name] Samul Mcinroy RN, BSN Nurse Case Editor, [facility]ing [facility]  [phone]) Business Mobile

## 2018-11-13 ENCOUNTER — Other Ambulatory Visit: Payer: Self-pay

## 2018-11-13 ENCOUNTER — Encounter
Admission: RE | Admit: 2018-11-13 | Discharge: 2018-11-13 | Disposition: A | Discharge status: Home / Self Care | Payer: Medicare Other | Source: Ambulatory Visit | Attending: Pulmonary Disease | Admitting: Pulmonary Disease

## 2018-11-13 DIAGNOSIS — R9431 Abnormal electrocardiogram [ECG] [EKG]: Secondary | ICD-10-CM | POA: Diagnosis not present

## 2018-11-13 DIAGNOSIS — R911 Solitary pulmonary nodule: Secondary | ICD-10-CM | POA: Insufficient documentation

## 2018-11-13 DIAGNOSIS — J4520 Mild intermittent asthma, uncomplicated: Secondary | ICD-10-CM | POA: Diagnosis not present

## 2018-11-13 DIAGNOSIS — J309 Allergic rhinitis, unspecified: Secondary | ICD-10-CM | POA: Diagnosis not present

## 2018-11-13 DIAGNOSIS — Z884 Allergy status to anesthetic agent status: Secondary | ICD-10-CM | POA: Diagnosis not present

## 2018-11-13 DIAGNOSIS — I10 Essential (primary) hypertension: Secondary | ICD-10-CM | POA: Diagnosis not present

## 2018-11-13 DIAGNOSIS — Z01818 Encounter for other preprocedural examination: Secondary | ICD-10-CM | POA: Diagnosis not present

## 2018-11-13 DIAGNOSIS — T50995D Adverse effect of other drugs, medicaments and biological substances, subsequent encounter: Secondary | ICD-10-CM | POA: Diagnosis not present

## 2018-11-13 DIAGNOSIS — Z20828 Contact with and (suspected) exposure to other viral communicable diseases: Secondary | ICD-10-CM | POA: Insufficient documentation

## 2018-11-13 HISTORY — DX: Dyspnea, unspecified: R06.00

## 2018-11-13 HISTORY — DX: Gastro-esophageal reflux disease without esophagitis: K21.9

## 2018-11-13 NOTE — Patient Instructions (Signed)
Your procedure is scheduled on: Wed. 9/16 Report to Day Surgery. To find out your arrival time please call [phone] between 1PM - 3PM on Tues 9/15.  Remember: Instructions that are not followed completely may result in serious medical risk,  up to and including death, or upon the discretion of your surgeon and anesthesiologist your  surgery may need to be rescheduled.     _X__ 1. Do not eat food after midnight the night before your procedure.                 No gum chewing or hard candies. You may drink clear liquids up to 2 hours                 before you are scheduled to arrive for your surgery- DO not drink clear                 liquids within 2 hours of the start of your surgery.                 Clear Liquids include:  water, Black Coffee or Tea (Do not add                 anything to coffee or tea).  __X__2.  On the morning of surgery brush your teeth with toothpaste and water, you                may rinse your mouth with mouthwash if you wish.  Do not swallow any toothpaste of mouthwash.     _X__ 3.  No Alcohol for 24 hours before or after surgery.   _X__ 4.  Do Not Smoke or use e-cigarettes For 24 Hours Prior to Your Surgery.                 Do not use any chewable tobacco products for at least 6 hours prior to                 surgery.  ____  5.  Bring all medications with you on the day of surgery if instructed.   ___x_  6.  Notify your doctor if there is any change in your medical condition      (cold, fever, infections).     Do not wear jewelry, make-up, hairpins, clips or nail polish. Do not wear lotions, powders, or perfumes. You may wear deodorant. Do not shave 48 hours prior to surgery. Men may shave face and neck. Do not bring valuables to the hospital.    [facility] is not responsible for any belongings or valuables.  Contacts, dentures or bridgework may not be worn into surgery. Leave your suitcase in the car. After surgery it may  be brought to your room. For patients admitted to the hospital, discharge time is determined by your treatment team.   Patients discharged the day of surgery will not be allowed to drive home.   Please read over the following fact sheets that you were given:     ____ Take these medicines the morning of surgery with A SIP OF WATER:    1. esomeprazole (NEXIUM) 40 MG capsule  Dose the night before and the morning of surgery  2. hydrALAZINE (APRESOLINE) 100 MG tablet  3. metoprolol succinate (TOPROL-XL) 100 MG 24 hr tablet  4.  5.  6.  ____ Fleet Enema (as directed)   ____ Use CHG Soap as directed  __x__ Use inhalers on the day of surgery albuterol (  PROVENTIL HFA;VENTOLIN HFA) 108 (90 Base) MCG/ACT inhaler and bring with you  _x___ Stop metformin 2 days prior to surgery  Last dose on Sunday 9/13   ____ Take 1/2 of usual insulin dose the night before surgery. No insulin the morning          of surgery.   ____ Stop Coumadin/Plavix/aspirin on   __x__ Stop Anti-inflammatories on   May take tylenol   ____ Stop supplements until after surgery.    ____ Bring C-Pap to the hospital.

## 2018-11-14 LAB — SARS CORONAVIRUS 2 (TAT 6-24 HRS): SARS Coronavirus 2: NEGATIVE

## 2018-11-16 NOTE — Telephone Encounter (Signed)
[facility] called stating Kristopher [name] is ok to use lidocaine, they did not test Naropin as they can only test one at a time.

## 2018-11-16 NOTE — Telephone Encounter (Signed)
Noted and will pass the info on to Dr. Dossie Arbour.

## 2018-11-17 ENCOUNTER — Ambulatory Visit
Admission: RE | Admit: 2018-11-17 | Discharge: 2018-11-17 | Disposition: A | Discharge status: Home / Self Care | Payer: Medicare Other | Source: Ambulatory Visit | Attending: Pulmonary Disease | Admitting: Pulmonary Disease

## 2018-11-17 ENCOUNTER — Other Ambulatory Visit: Payer: Self-pay

## 2018-11-17 DIAGNOSIS — R911 Solitary pulmonary nodule: Secondary | ICD-10-CM | POA: Diagnosis not present

## 2018-11-18 ENCOUNTER — Ambulatory Visit: Payer: Medicare Other

## 2018-11-18 ENCOUNTER — Ambulatory Visit: Payer: Medicare Other | Admitting: Anesthesiology

## 2018-11-18 ENCOUNTER — Ambulatory Visit
Admission: RE | Admit: 2018-11-18 | Discharge: 2018-11-18 | Disposition: A | Discharge status: Home / Self Care | Payer: Medicare Other | Attending: Pulmonary Disease | Admitting: Pulmonary Disease

## 2018-11-18 ENCOUNTER — Ambulatory Visit
Admission: RE | Disposition: A | Discharge status: Home / Self Care | Payer: Self-pay | Source: Home / Self Care | Attending: Pulmonary Disease

## 2018-11-18 ENCOUNTER — Encounter: Payer: Self-pay | Admitting: *Deleted

## 2018-11-18 ENCOUNTER — Other Ambulatory Visit: Payer: Self-pay

## 2018-11-18 DIAGNOSIS — K219 Gastro-esophageal reflux disease without esophagitis: Secondary | ICD-10-CM | POA: Diagnosis not present

## 2018-11-18 DIAGNOSIS — I129 Hypertensive chronic kidney disease with stage 1 through stage 4 chronic kidney disease, or unspecified chronic kidney disease: Secondary | ICD-10-CM | POA: Diagnosis not present

## 2018-11-18 DIAGNOSIS — R911 Solitary pulmonary nodule: Secondary | ICD-10-CM | POA: Diagnosis not present

## 2018-11-18 DIAGNOSIS — E1122 Type 2 diabetes mellitus with diabetic chronic kidney disease: Secondary | ICD-10-CM | POA: Diagnosis not present

## 2018-11-18 DIAGNOSIS — Z880 Allergy status to penicillin: Secondary | ICD-10-CM | POA: Insufficient documentation

## 2018-11-18 DIAGNOSIS — Z96642 Presence of left artificial hip joint: Secondary | ICD-10-CM | POA: Diagnosis not present

## 2018-11-18 DIAGNOSIS — Z886 Allergy status to analgesic agent status: Secondary | ICD-10-CM | POA: Insufficient documentation

## 2018-11-18 DIAGNOSIS — N189 Chronic kidney disease, unspecified: Secondary | ICD-10-CM | POA: Insufficient documentation

## 2018-11-18 DIAGNOSIS — Z888 Allergy status to other drugs, medicaments and biological substances status: Secondary | ICD-10-CM | POA: Insufficient documentation

## 2018-11-18 DIAGNOSIS — R918 Other nonspecific abnormal finding of lung field: Secondary | ICD-10-CM | POA: Insufficient documentation

## 2018-11-18 DIAGNOSIS — Z77090 Contact with and (suspected) exposure to asbestos: Secondary | ICD-10-CM | POA: Diagnosis not present

## 2018-11-18 DIAGNOSIS — Z9889 Other specified postprocedural states: Secondary | ICD-10-CM

## 2018-11-18 DIAGNOSIS — M1990 Unspecified osteoarthritis, unspecified site: Secondary | ICD-10-CM | POA: Diagnosis not present

## 2018-11-18 DIAGNOSIS — N181 Chronic kidney disease, stage 1: Secondary | ICD-10-CM | POA: Diagnosis not present

## 2018-11-18 DIAGNOSIS — F17210 Nicotine dependence, cigarettes, uncomplicated: Secondary | ICD-10-CM | POA: Insufficient documentation

## 2018-11-18 HISTORY — PX: ELECTROMAGNETIC NAVIGATION BROCHOSCOPY: SHX5369

## 2018-11-18 LAB — GLUCOSE, CAPILLARY
Glucose-Capillary: 96 mg/dL (ref 70–99)
Glucose-Capillary: 94 mg/dL (ref 70–99)

## 2018-11-18 SURGERY — ELECTROMAGNETIC NAVIGATION BRONCHOSCOPY
Anesthesia: General | Laterality: Left

## 2018-11-18 MED ORDER — MIDAZOLAM HCL 2 MG/2ML IJ SOLN
INTRAMUSCULAR | Status: DC | PRN
  Administered 2018-11-18: 2 mg via INTRAVENOUS

## 2018-11-18 MED ORDER — ALBUTEROL SULFATE HFA 108 (90 BASE) MCG/ACT IN AERS
INHALATION_SPRAY | RESPIRATORY_TRACT | Status: DC | PRN
  Administered 2018-11-18: 5 via RESPIRATORY_TRACT
  Administered 2018-11-18: 6 via RESPIRATORY_TRACT

## 2018-11-18 MED ORDER — ESMOLOL HCL 100 MG/10ML IV SOLN
INTRAVENOUS | Status: DC | PRN
  Administered 2018-11-18: 10 mg via INTRAVENOUS

## 2018-11-18 MED ORDER — ROCURONIUM BROMIDE 100 MG/10ML IV SOLN
INTRAVENOUS | Status: DC | PRN
  Administered 2018-11-18: 50 mg via INTRAVENOUS
  Administered 2018-11-18: 10 mg via INTRAVENOUS
  Administered 2018-11-18: 20 mg via INTRAVENOUS

## 2018-11-18 MED ORDER — ROCURONIUM BROMIDE 50 MG/5ML IV SOLN
INTRAVENOUS | Status: AC
  Filled 2018-11-18: qty 1

## 2018-11-18 MED ORDER — SUGAMMADEX SODIUM 200 MG/2ML IV SOLN
INTRAVENOUS | Status: DC | PRN
  Administered 2018-11-18: 208.2 mg via INTRAVENOUS

## 2018-11-18 MED ORDER — FENTANYL CITRATE (PF) 100 MCG/2ML IJ SOLN
INTRAMUSCULAR | Status: DC | PRN
  Administered 2018-11-18: 100 ug via INTRAVENOUS

## 2018-11-18 MED ORDER — PHENYLEPHRINE HCL (PRESSORS) 10 MG/ML IV SOLN
INTRAVENOUS | Status: DC | PRN
  Administered 2018-11-18 (×2): 100 ug via INTRAVENOUS

## 2018-11-18 MED ORDER — SODIUM CHLORIDE 0.9 % IV SOLN
Freq: Once | INTRAVENOUS | Status: AC
  Administered 2018-11-18: 10:00:00 via INTRAVENOUS

## 2018-11-18 MED ORDER — PROPOFOL 500 MG/50ML IV EMUL
INTRAVENOUS | Status: AC
  Filled 2018-11-18: qty 50

## 2018-11-18 MED ORDER — EPHEDRINE SULFATE 50 MG/ML IJ SOLN: INTRAMUSCULAR | Status: DC | PRN

## 2018-11-18 MED ORDER — MIDAZOLAM HCL 2 MG/2ML IJ SOLN
INTRAMUSCULAR | Status: AC
  Filled 2018-11-18: qty 2

## 2018-11-18 MED ORDER — FENTANYL CITRATE (PF) 100 MCG/2ML IJ SOLN
INTRAMUSCULAR | Status: AC
  Filled 2018-11-18: qty 2

## 2018-11-18 MED ORDER — FENTANYL CITRATE (PF) 100 MCG/2ML IJ SOLN: 25.0000 ug | INTRAMUSCULAR | Status: DC | PRN

## 2018-11-18 MED ORDER — PROPOFOL 10 MG/ML IV BOLUS
INTRAVENOUS | Status: DC | PRN
  Administered 2018-11-18: 170 mg via INTRAVENOUS

## 2018-11-18 MED ORDER — PROPOFOL 500 MG/50ML IV EMUL
INTRAVENOUS | Status: DC | PRN
  Administered 2018-11-18: 150 ug/kg/min via INTRAVENOUS

## 2018-11-18 MED ORDER — PROPOFOL 10 MG/ML IV BOLUS
INTRAVENOUS | Status: AC
  Filled 2018-11-18: qty 20

## 2018-11-18 NOTE — Op Note (Signed)
Electromagnetic Navigation Bronchoscopy:   Indication: LEFT lung mass/nodule;superior segment left lower lobe  Preoperative Diagnosis: LEFT lung nodule/mass Post Procedure Diagnosis: Same as above   Consent: Verbal/Written  Benefits, limitations and potential complications of the procedure were discussed with the patient/family  including, but not limited to bleeding, hemoptysis, respiratory failure requiring intubation and/or prolongued mechanical ventilation, infection, pneumothorax (collapse of lung) requiring chest tube placement, stroke from air embolism or even death.  Patient agreed to proceed.  Consent signed.  Operator: Patsey Berthold Scrub: Dina Rich, RT Circulator: Annia Belt, RT  Type of Anesthesia: General anesthesia, see anesthesia records  Procedure Performed:  Virtual Bronchoscopy with Multi-planar Image analysis, 3-D reconstruction of coronal, sagittal and multi-planar images for the purposes of planning real-time bronchoscopy using the iLogic Electromagnetic Navigation Bronchoscopy System (superDimension).  In addition endo-microscopy was performed.  Description of Procedure: Patient was taken to Procedure Room 2 in the OR where appropriate timeout was taken with the staff.  He was inducted under general anesthesia and intubated by CRNA/anesthesiologist.  Please refer to those records for details.  Patient was intubated with a 8.5 ET tube without difficulty.  A Portex adapter was placed on the flange of the ET tube.  Once the patient had achieved adequate general anesthesia the Olympus video bronchoscope was advanced through the existing Portex adapter.  Anatomic survey of the airways was then performed.  Visible portions of the trachea were normal.  Carina was sharp.  Progressing into the right mainstem bronchus the right tracheobronchial tree showed no evidence of lesions or cobblestoning.  The patient did have copious thick secretions that were lavaged till clear.  At this  point the bronchoscope was brought to the left and in similar fashion the left tracheobronchial tree was examined.  No endobronchial lesions were noted.  There was narrowing of the most medial subsegment of the LEFT lower lobe superior subsegment.  Patient had secretions on the left as noted previously on the right these were thick and were lavaged till clear.  At this point the extended working channel and locatable guide (LG) probe were introduced into the working channel of the bronchoscope.The LG was directed to standard registration points at the following centers: main carina, right upper lobe bronchus, right lower lobe bronchus, right middle lobe bronchus, left upper lobe bronchus, and the left lower lobe bronchus. This data was transferred to the i-Logic ENB system for real-time bronchoscopy and concordance with the CT scan.  Once registration was performed, the bronchoscope was advanced to the superior segment of the LEFT lower lobe and wedged into position.  The locatable guide was then navigated to the target on the LEFT lower lobe superior subsegment for tissue sampling.  The lesion was not readily visible on fluoroscopy, at this point Cellvizio endo-microscopy was performed and there appeared to be significant inflammation of the area.  The Cellvizio probe was then removed and locatable guide placed again and confirmation with target acquisition was done 1 more time.  Cellvizio was then utilized again indicating abnormal tissue with significant inflammation.  However it could not be characterized fully as malignant.  At this point utilizing fluoroscopy for guidance brushings were performed of this area.  A total of 4 passes were performed with the brush.  Once brushings were completed biopsies of the area were performed.  After the second biopsy, there were some issues with the anesthesia equipment and the procedure had to be placed on hold until troubleshooting of the anesthesia machine was done.  Once  this was corrected the procedure continued.  4 more specimens were obtained with super dimension forceps but these were changed for ConMed precise or forceps to get better tissue.  A total of 10 biopsies were performed with fluoroscopic guidance.  These were all in the LEFT lower lobe superior segment.  Once this was completed and another brushing was performed of the area and a brush was placed on CytoLyt.  Targeted bronchoalveolar lavage was then performed with 40 mL's of saline instilled yielding 8 mL's of aliquot (20 mL instilled x2).  The bronchoalveolar lavage fluid will be sent for cytology review.  Once this was completed, the airway was inspected and good hemostasis was noted.  At this point the bronchoscope was retrieved and the seizure was terminated.  Patient was allowed to emerge from general anesthesia and was extubated in the procedure room and taken to the PACU in satisfactory condition.  Postprocedure chest x-ray showed no pneumothorax.  Patient tolerated the procedure well.   Specimens Obtained:  Transbronchial Forceps Biopsy: X10  Transbronchial Brush: X 5 passes (2 cut brushes to cytolyt)  Targeted bronchoalveolar lavage: Aliquot 8 mL's   Fluoroscopy:  Fluoroscopy was utilized during the course of this procedure to assure that biopsies were taken in a safe manner under fluoroscopic guidance with spot films required.   Complications:None, postprocedure chest x-ray showed no pneumothorax.  Estimated Blood Loss: Less than 5 mL's   Assessment and Plan/Additional Comments:  1.  LEFT lower lobe superior segment lesion with inflammatory characteristics, cannot exclude carcinoma.  Await pathology report.  2.  Patient significant other and designated contact by the patient made aware of the findings and the possibility the patient may need referral to thoracic surgery for excision of the nodule.  Renold Don, MD [facility] Advanced Bronchoscopy

## 2018-11-18 NOTE — Anesthesia Procedure Notes (Signed)
Procedure Name: Intubation Date/Time: 11/18/2018 10:19 AM Performed by: Allean Found, CRNA Pre-anesthesia Checklist: Patient identified, Patient being monitored, Timeout performed, Emergency Drugs available and Suction available Patient Re-evaluated:Patient Re-evaluated prior to induction Oxygen Delivery Method: Circle system utilized Preoxygenation: Pre-oxygenation with 100% oxygen Induction Type: IV induction Ventilation: Mask ventilation without difficulty Laryngoscope Size: McGraph and 4 Grade View: Grade I Tube type: Oral Tube size: 8.5 mm Number of attempts: 1 Airway Equipment and Method: Stylet Placement Confirmation: ETT inserted through vocal cords under direct vision,  positive ETCO2 and breath sounds checked- equal and bilateral Secured at: 24 cm Tube secured with: Tape Dental Injury: Teeth and Oropharynx as per pre-operative assessment

## 2018-11-18 NOTE — Anesthesia Post-op Follow-up Note (Signed)
Anesthesia QCDR form completed.        

## 2018-11-18 NOTE — Interval H&P Note (Signed)
History and Physical Interval Note:  11/18/2018 9:46 AM  Kristopher [name]  has presented today for surgery, with the diagnosis of lung nodule.  The various methods of treatment have been discussed with the patient and family. After consideration of risks, benefits and other options for treatment, the patient has consented to  Procedure(s): ELECTROMAGNETIC NAVIGATION BRONCHOSCOPY (Left) as a surgical intervention.  The patient's history has been reviewed, patient examined, no change in status, stable for surgery.  I have reviewed the patient's chart and labs.  Questions were answered to the patient's satisfaction.     Renold Don, MD [facility]

## 2018-11-18 NOTE — Anesthesia Preprocedure Evaluation (Addendum)
Anesthesia Evaluation  Patient identified by MRN, date of birth, ID band Patient awake    Reviewed: Allergy & Precautions, H&P , NPO status , Patient's Chart, lab work & pertinent test results  Airway Mallampati: II  TM Distance: >3 FB     Dental  (+) Poor Dentition, Missing   Pulmonary shortness of breath and with exertion, Current Smoker and Patient abstained from smoking.,  Lung nodule          Cardiovascular hypertension, (-) Past MI and (-) Cardiac Stents (-) dysrhythmias      Neuro/Psych  Headaches, negative psych ROS   GI/Hepatic Neg liver ROS, GERD  Controlled,  Endo/Other  negative endocrine ROSdiabetes  Renal/GU CRFRenal disease     Musculoskeletal   Abdominal   Peds  Hematology negative hematology ROS (+)   Anesthesia Other Findings Past Medical History: No date: Allergy No date: Arthritis     Comment:  left foot No date: Benign hypertensive kidney disease No date: Chronic back pain     Comment:  Four rods in back No date: Diabetes mellitus, type 2 (HCC) No date: Dyspnea No date: GERD (gastroesophageal reflux disease) No date: Hypertension No date: Migraines     Comment:  daily  Past Surgical History: No date: APPENDECTOMY No date: BACK SURGERY 02/19/2016: COLONOSCOPY WITH PROPOFOL; N/A     Comment:  Procedure: COLONOSCOPY WITH PROPOFOL;  Surgeon: Lucilla Lame, MD;  Location: [place];  Service:               Endoscopy;  Laterality: N/A; 01/19/2018: COLONOSCOPY WITH PROPOFOL; N/A     Comment:  Procedure: COLONOSCOPY WITH PROPOFOL;  Surgeon: Lucilla Lame, MD;  Location: [place];  Service:               Endoscopy;  Laterality: N/A;  Diabetic - oral meds No date: DG OPERATIVE LEFT HIP ([place] HX)     Comment:  10/19 No date: FOOT SURGERY; Left     Comment:  Screws and plates 12/2017: JOINT REPLACEMENT; Left     Comment:  DR Rudene Christians Hip No date:  KNEE SURGERY; Left     Comment:  X 2 No date: LEG SURGERY 02/19/2016: POLYPECTOMY; N/A     Comment:  Procedure: POLYPECTOMY;  Surgeon: Lucilla Lame, MD;                Location: [place];  Service: Endoscopy;                Laterality: N/A; 01/19/2018: POLYPECTOMY     Comment:  Procedure: POLYPECTOMY;  Surgeon: Lucilla Lame, MD;                Location: [place];  Service: Endoscopy;; 12/02/2017: TOTAL HIP ARTHROPLASTY; Left     Comment:  Procedure: TOTAL HIP ARTHROPLASTY ANTERIOR APPROACH;                Surgeon: Hessie Knows, MD;  Location: [facility];                Service: Orthopedics;  Laterality: Left;     Reproductive/Obstetrics negative OB ROS                            Anesthesia Physical Anesthesia Plan  ASA: III  Anesthesia Plan: General ETT   Post-op Pain Management:    Induction:   PONV Risk Score and Plan: Ondansetron, Dexamethasone, Midazolam and Treatment may vary due to age or medical condition  Airway Management Planned:   Additional Equipment:   Intra-op Plan:   Post-operative Plan:   Informed Consent: I have reviewed the patients History and Physical, chart, labs and discussed the procedure including the risks, benefits and alternatives for the proposed anesthesia with the patient or authorized representative who has indicated his/her understanding and acceptance.     Dental Advisory Given  Plan Discussed with: Anesthesiologist, CRNA and Surgeon  Anesthesia Plan Comments:         Anesthesia Quick Evaluation

## 2018-11-18 NOTE — Transfer of Care (Signed)
Immediate Anesthesia Transfer of Care Note  Patient: Kristopher [name]  Procedure(s) Performed: ELECTROMAGNETIC NAVIGATION BRONCHOSCOPY (Left )  Patient Location: PACU  Anesthesia Type:General  Level of Consciousness: drowsy  Airway & Oxygen Therapy: Patient Spontanous Breathing and Patient connected to face mask oxygen  Post-op Assessment: Report given to RN and Post -op Vital signs reviewed and stable  Post vital signs: Reviewed and stable  Last Vitals:  Vitals Value Taken Time  BP 129/79 11/18/18 1204  Temp 36.4 C 11/18/18 1204  Pulse 67 11/18/18 1208  Resp 16 11/18/18 1208  SpO2 100 % 11/18/18 1208  Vitals shown include unvalidated device data.  Last Pain:  Vitals:   11/18/18 1204  TempSrc:   PainSc: (P) Asleep         Complications: No apparent anesthesia complications

## 2018-11-18 NOTE — Discharge Instructions (Signed)
Flexible Bronchoscopy, Care After This sheet gives you information about how to care for yourself after your test. Your doctor may also give you more specific instructions. If you have problems or questions, contact your doctor. Follow these instructions at home: Eating and drinking  Do not eat or drink anything (not even water) for 2 hours after your test, or until your numbing medicine (local anesthetic) wears off.  When your numbness is gone and your cough and gag reflexes have come back, you may: ? Eat only soft foods. ? Slowly drink liquids.  The day after the test, go back to your normal diet. Driving  Do not drive for 24 hours if you were given a medicine to help you relax (sedative).  Do not drive or use heavy machinery while taking prescription pain medicine. General instructions   Take over-the-counter and prescription medicines only as told by your doctor.  Return to your normal activities as told. Ask what activities are safe for you.  Do not use any products that have nicotine or tobacco in them. This includes cigarettes and e-cigarettes. If you need help quitting, ask your doctor.  Keep all follow-up visits as told by your doctor. This is important. It is very important if you had a tissue sample (biopsy) taken. Get help right away if:  You have shortness of breath that gets worse.  You get light-headed.  You feel like you are going to pass out (faint).  You have chest pain.  You cough up: ? More than a little blood. ? More blood than before. Summary  Do not eat or drink anything (not even water) for 2 hours after your test, or until your numbing medicine wears off.  Do not use cigarettes. Do not use e-cigarettes.  Get help right away if you have chest pain. This information is not intended to replace advice given to you by your health care provider. Make sure you discuss any questions you have with your health care provider. Document Released: 12/16/2008  Document Revised: 01/31/2017 Document Reviewed: 03/08/2016 Elsevier Patient Education  2020 [employer]   1) The drugs that you were given will stay in your system until tomorrow so for the next 24 hours you should not:  A) Drive an automobile B) Make any legal decisions C) Drink any alcoholic beverage   2) You may resume regular meals tomorrow.  Today it is better to start with liquids and gradually work up to solid foods.  You may eat anything you prefer, but it is better to start with liquids, then soup and crackers, and gradually work up to solid foods.   3) Please notify your doctor immediately if you have any unusual bleeding, trouble breathing, redness and pain at the surgery site, drainage, fever, or pain not relieved by medication.    4) Additional Instructions:        Please contact your physician with any problems or Same Day Surgery at [phone], Monday through Friday 6 am to 4 pm, or [place] at [facility] number at [phone].

## 2018-11-19 ENCOUNTER — Encounter: Payer: Self-pay | Admitting: Pulmonary Disease

## 2018-11-19 NOTE — Anesthesia Postprocedure Evaluation (Signed)
Anesthesia Post Note  Patient: Kristopher [name]  Procedure(s) Performed: ELECTROMAGNETIC NAVIGATION BRONCHOSCOPY (Left )  Patient location during evaluation: PACU Anesthesia Type: General Level of consciousness: awake and alert Pain management: pain level controlled Vital Signs Assessment: post-procedure vital signs reviewed and stable Respiratory status: spontaneous breathing, nonlabored ventilation and respiratory function stable Cardiovascular status: blood pressure returned to baseline and stable Postop Assessment: no apparent nausea or vomiting Anesthetic complications: no     Last Vitals:  Vitals:   11/18/18 1257 11/18/18 1311  BP: (!) 143/108 (!) 163/113  Pulse: 65 67  Resp: 16 16  Temp: (!) 36.3 C   SpO2: 99% 97%    Last Pain:  Vitals:   11/18/18 1311  TempSrc:   PainSc: 0-No pain                 Durenda Hurt

## 2018-11-24 ENCOUNTER — Other Ambulatory Visit: Payer: Self-pay | Admitting: Family Medicine

## 2018-11-24 ENCOUNTER — Other Ambulatory Visit: Payer: Self-pay | Admitting: Pulmonary Disease

## 2018-11-24 ENCOUNTER — Telehealth: Payer: Self-pay | Admitting: Pulmonary Disease

## 2018-11-24 DIAGNOSIS — I129 Hypertensive chronic kidney disease with stage 1 through stage 4 chronic kidney disease, or unspecified chronic kidney disease: Secondary | ICD-10-CM

## 2018-11-24 DIAGNOSIS — R911 Solitary pulmonary nodule: Secondary | ICD-10-CM

## 2018-11-24 NOTE — Telephone Encounter (Signed)
Called and spoke to pt's spouse, Rip Harbour ([place]).  Below results have been given to [place]. Rip Harbour voiced her understanding and had no further questions.  Nothing further is needed.    Tyler Pita, MD  Kenyetta Fife A, CMA        Attempted to call patient. Voicemail is not set up. The biopsy showed only inflammation but he will need further evaluation of this recommend evaluation by Dr. Genevive Bi of thoracic surgery. I had discussed this possibility with his significant other after the procedure.

## 2018-11-24 NOTE — Telephone Encounter (Signed)
Will send this message to [facility] triage.

## 2018-11-24 NOTE — Telephone Encounter (Signed)
Requested medications are due for refill today?  No  Requested medications are on the active medication list?  Yes- directions are different on medication list   Last refill - 10/06/2018 , #180, 0 refills   Future visit scheduled?  No  Notes to clinic- Directions different on medication list  Requested Prescriptions  Pending Prescriptions Disp Refills   metFORMIN (GLUCOPHAGE-XR) 500 MG 24 hr tablet [Pharmacy Med Name: METFORMIN HCL ER 500 MG TABLET] 360 tablet 0    Sig: TAKE 2 TABLETS (1,000 MG TOTAL) BY MOUTH 2 (TWO) TIMES DAILY WITH A MEAL.     Endocrinology:  Diabetes - Biguanides Passed - 11/24/2018  4:00 PM      Passed - Cr in normal range and within 360 days    Creatinine, Ser  Date Value Ref Range Status  10/06/2018 0.94 0.76 - 1.27 mg/dL Final         Passed - HBA1C is between 0 and 7.9 and within 180 days    HB A1C (BAYER DCA - WAIVED)  Date Value Ref Range Status  10/06/2018 5.8 <7.0 % Final    Comment:                                          Diabetic Adult            <7.0                                       Healthy Adult        4.3 - 5.7                                                           (DCCT/NGSP) American Diabetes Association's Summary of Glycemic Recommendations for Adults with Diabetes: Hemoglobin A1c <7.0%. More stringent glycemic goals (A1c <6.0%) may further reduce complications at the cost of increased risk of hypoglycemia.          Passed - eGFR in normal range and within 360 days    GFR calc Af Amer  Date Value Ref Range Status  10/06/2018 104 >59 mL/min/1.73 Final   GFR calc non Af Amer  Date Value Ref Range Status  10/06/2018 90 >59 mL/min/1.73 Final         Passed - Valid encounter within last 6 months    Recent Outpatient Visits          1 month ago Type 2 diabetes mellitus with stage 1 chronic kidney disease, without long-term current use of insulin ([place])   [place], [name], DO   2 months ago Benign  hypertensive renal disease   [facility] [place], [name], DO   3 months ago Benign hypertensive renal disease   [facility] [place], [name], DO   4 months ago Type 2 diabetes mellitus with stage 1 chronic kidney disease, without long-term current use of insulin ([place])   [place], [name], DO   6 months ago COPD exacerbation [facility])   [place], Barb Merino, DO      Future Appointments  In 6 days Tyler Pita, MD [facility] [place]   In 4 months Wynetta Emery, Barb Merino, DO [facility], [name]           Refused Prescriptions Disp Refills   hydrALAZINE (APRESOLINE) 50 MG tablet [Pharmacy Med Name: HYDRALAZINE 50 MG TABLET] 270 tablet 1    Sig: TAKE 1 TABLET BY MOUTH THREE TIMES A DAY     Cardiovascular:  Vasodilators Failed - 11/24/2018  4:00 PM      Failed - HCT in normal range and within 360 days    HCT  Date Value Ref Range Status  12/04/2017 33.7 (L) 40.0 - 52.0 % Final   Hematocrit  Date Value Ref Range Status  03/31/2017 45.1 37.5 - 51.0 % Final         Failed - HGB in normal range and within 360 days    Hemoglobin  Date Value Ref Range Status  12/04/2017 11.8 (L) 13.0 - 18.0 g/dL Final  03/31/2017 14.9 13.0 - 17.7 g/dL Final         Failed - RBC in normal range and within 360 days    RBC  Date Value Ref Range Status  12/04/2017 3.73 (L) 4.40 - 5.90 MIL/uL Final         Failed - WBC in normal range and within 360 days    WBC  Date Value Ref Range Status  12/04/2017 10.7 (H) 3.8 - 10.6 K/uL Final         Failed - PLT in normal range and within 360 days    Platelets  Date Value Ref Range Status  12/04/2017 139 (L) 150 - 440 K/uL Final    Comment:    Performed at [facility], [address] [place], [place] [address]  03/31/2017 194 150 - 379 x10E3/uL Final         Failed - Last BP in normal range    BP Readings from Last 1 Encounters:  11/18/18  (!) 163/113         Passed - Valid encounter within last 12 months    Recent Outpatient Visits          1 month ago Type 2 diabetes mellitus with stage 1 chronic kidney disease, without long-term current use of insulin ([place])   [facility] [place], [name], DO   2 months ago Benign hypertensive renal disease   [facility] [place], [name], DO   3 months ago Benign hypertensive renal disease   [facility] [place], [name], DO   4 months ago Type 2 diabetes mellitus with stage 1 chronic kidney disease, without long-term current use of insulin ([place])   [place], [place], DO   6 months ago COPD exacerbation [facility])   [place], [place], DO      Future Appointments            In 6 days Tyler Pita, MD [facility] [place]   In 4 months Johnson, [name], DO [facility], [name]            hydrALAZINE (APRESOLINE) 50 MG tablet [Pharmacy Med Name: HYDRALAZINE 50 MG TABLET] 180 tablet 1    Sig: TAKE 1 TABLET BY MOUTH TWICE A DAY     Cardiovascular:  Vasodilators Failed - 11/24/2018  4:00 PM      Failed - HCT in normal range and within 360 days    HCT  Date  Value Ref Range Status  12/04/2017 33.7 (L) 40.0 - 52.0 % Final   Hematocrit  Date Value Ref Range Status  03/31/2017 45.1 37.5 - 51.0 % Final         Failed - HGB in normal range and within 360 days    Hemoglobin  Date Value Ref Range Status  12/04/2017 11.8 (L) 13.0 - 18.0 g/dL Final  03/31/2017 14.9 13.0 - 17.7 g/dL Final         Failed - RBC in normal range and within 360 days    RBC  Date Value Ref Range Status  12/04/2017 3.73 (L) 4.40 - 5.90 MIL/uL Final         Failed - WBC in normal range and within 360 days    WBC  Date Value Ref Range Status  12/04/2017 10.7 (H) 3.8 - 10.6 K/uL Final         Failed - PLT in normal range and within 360 days    Platelets  Date Value Ref Range Status  12/04/2017 139  (L) 150 - 440 K/uL Final    Comment:    Performed at [facility], [address] [place], [place] [address]  03/31/2017 194 150 - 379 x10E3/uL Final         Failed - Last BP in normal range    BP Readings from Last 1 Encounters:  11/18/18 (!) 163/113         Passed - Valid encounter within last 12 months    Recent Outpatient Visits          1 month ago Type 2 diabetes mellitus with stage 1 chronic kidney disease, without long-term current use of insulin ([place])   [place], [name], DO   2 months ago Benign hypertensive renal disease   [facility] [place], [name], DO   3 months ago Benign hypertensive renal disease   [facility] [place], [name], DO   4 months ago Type 2 diabetes mellitus with stage 1 chronic kidney disease, without long-term current use of insulin ([place])   [place], [place], DO   6 months ago COPD exacerbation [facility])   [place], Barb Merino, DO      Future Appointments            In 6 days Tyler Pita, MD [facility] [place]   In 4 months Wynetta Emery, Barb Merino, DO [facility], [name]

## 2018-11-24 NOTE — Telephone Encounter (Signed)
Requested medication (s) are due for refill today: yes  Requested medication (s) are on the active medication list: yes  Last refill:  05/06/2018  Future visit scheduled: yes  Notes to clinic:  Sig is wrong. What does? I see no ordered 25mg  which patient is to take with this?    Requested Prescriptions  Pending Prescriptions Disp Refills   metoprolol succinate (TOPROL-XL) 100 MG 24 hr tablet [Pharmacy Med Name: METOPROLOL SUCCINATE ER 100 MG TAB] 90 tablet 0    Sig: TAKE 1 TABLET BY MOUTH AT BEDTIME WITH THE 25 MG TO EQUAL 125 MG ONCEDAILY     Cardiovascular:  Beta Blockers Failed - 11/24/2018  4:04 PM      Failed - Last BP in normal range    BP Readings from Last 1 Encounters:  11/18/18 (!) 163/113         Passed - Last Heart Rate in normal range    Pulse Readings from Last 1 Encounters:  11/18/18 67         Passed - Valid encounter within last 6 months    Recent Outpatient Visits          1 month ago Type 2 diabetes mellitus with stage 1 chronic kidney disease, without long-term current use of insulin ([place])   [facility] [place], [name], DO   2 months ago Benign hypertensive renal disease   [facility] [place], [name], DO   3 months ago Benign hypertensive renal disease   [facility] [place], [name], DO   4 months ago Type 2 diabetes mellitus with stage 1 chronic kidney disease, without long-term current use of insulin ([place])   [place], [place], DO   6 months ago COPD exacerbation [facility])   [place], Barb Merino, DO      Future Appointments            In 6 days Tyler Pita, MD [facility] [place]   In 4 months Wynetta Emery, Barb Merino, DO [facility], [name]

## 2018-11-26 ENCOUNTER — Ambulatory Visit: Payer: Medicare Other | Admitting: Pulmonary Disease

## 2018-11-30 ENCOUNTER — Ambulatory Visit (INDEPENDENT_AMBULATORY_CARE_PROVIDER_SITE_OTHER): Payer: Medicare Other | Admitting: Pulmonary Disease

## 2018-11-30 ENCOUNTER — Other Ambulatory Visit: Payer: Self-pay

## 2018-11-30 ENCOUNTER — Encounter: Payer: Self-pay | Admitting: Pulmonary Disease

## 2018-11-30 VITALS — BP 148/90 | HR 77 | Temp 98.0°F | Ht 70.0 in | Wt 229.6 lb

## 2018-11-30 DIAGNOSIS — K219 Gastro-esophageal reflux disease without esophagitis: Secondary | ICD-10-CM

## 2018-11-30 DIAGNOSIS — R911 Solitary pulmonary nodule: Secondary | ICD-10-CM | POA: Diagnosis not present

## 2018-11-30 DIAGNOSIS — F17210 Nicotine dependence, cigarettes, uncomplicated: Secondary | ICD-10-CM

## 2018-11-30 DIAGNOSIS — I10 Essential (primary) hypertension: Secondary | ICD-10-CM

## 2018-11-30 DIAGNOSIS — J4530 Mild persistent asthma, uncomplicated: Secondary | ICD-10-CM

## 2018-11-30 MED ORDER — ARNUITY ELLIPTA 100 MCG/ACT IN AEPB: 1.0000 | INHALATION_SPRAY | Freq: Every day | RESPIRATORY_TRACT | 6 refills | Status: DC

## 2018-11-30 NOTE — Progress Notes (Signed)
Subjective:    Patient ID: Kristopher [name], male    DOB: [birth year], 58 y.o.   MRN: [id number]  HPI Patient is a 58 year old current smoker (1 PPD) who presents for follow-up on a LEFT lower lobe nodule (superior segment).  The patient underwent navigational bronchoscopy on 18 November 2018.  The lesion was initially noted on a CT scan of the chest of 21 August 2017 this was for lung cancer screening purposes.  At that time the scan showed bilateral pulmonary nodules and the particular nodule in question was 5.6 mm.  Subsequently he had follow-up CT on 16 October 2018 which showed that the nodule in question was now measuring 10.3 mm and had apparent bronchograms through it.  PET/CT obtained on 2 September showed low SUV of 2.06 however slow-growing neoplasm could not be excluded.  Patient has remained asymptomatic with regards to this nodule.  As noted he underwent navigational bronchoscopy and Endo microscopy.  Endo-microscopy revealed that the target lesion had been reached to successfully however the characteristics were ill-defined and the lesion appeared to be surrounded by inflammation.  Pathology is consistent with inflammatory change including reactive bronchial cells mixed inflammatory cells and eosinophils.  However, the patient is exceedingly high risk because of his smoking history, PET/CT did have low uptake (albeit could be secondary to inflammation) and the patient is quite anxious about this nodule.  Because of this we have made a referral to thoracic surgery for evaluation of wedge resection.  The patient does not endorse any fevers, chills or sweats.  He has not had any symptoms develop since his initial visit.  Of note during the navigational bronchoscopy procedure he was noted to have significant bronchospasm.  He required bronchodilators postoperatively.  He requires flu vaccine but states that he will get this through his primary care provider.  He declines vaccine today.    Review of Systems  Constitutional: Negative.   HENT: Negative.   Eyes: Negative.   Respiratory: Negative.   Cardiovascular: Negative.   Gastrointestinal:       Occasional reflux symptoms  Endocrine: Negative.   Genitourinary: Negative.   Musculoskeletal: Negative.   Skin: Negative.   Allergic/Immunologic: Negative.   Neurological: Negative.   Hematological: Negative.   Psychiatric/Behavioral: Negative.   All other systems reviewed and are negative.      Objective:   Physical Exam Vitals signs and nursing note reviewed.  Constitutional:      Appearance: Normal appearance. He is overweight. He is not ill-appearing.  HENT:     Head: Normocephalic and atraumatic.     Right Ear: External ear normal.     Left Ear: External ear normal.     Nose:     Comments: Nose/mouth/throat not examined due to masking requirements for COVID 19. Eyes:     General: No scleral icterus.    Conjunctiva/sclera: Conjunctivae normal.     Pupils: Pupils are equal, round, and reactive to light.  Neck:     Musculoskeletal: Normal range of motion and neck supple.     Trachea: Trachea and phonation normal.  Cardiovascular:     Rate and Rhythm: Normal rate and regular rhythm.     Pulses: Normal pulses.     Heart sounds: Normal heart sounds.  Pulmonary:     Effort: Pulmonary effort is normal.     Breath sounds: Normal breath sounds.  Abdominal:     General: There is no distension.  Musculoskeletal: Normal range of motion.  Right lower leg: No edema.     Left lower leg: No edema.  Skin:    General: Skin is warm and dry.  Neurological:     General: No focal deficit present.     Mental Status: He is alert and oriented to person, place, and time.  Psychiatric:        Mood and Affect: Mood normal.        Behavior: Behavior normal.           Assessment & Plan:   1.  Left lower lobe superior segment nodule: FDG avidity is low however the nodule has doubled in size over the course of a  year.    Biopsy with navigational bronchoscopy showed mostly inflammatory cells.  Endo-microscopy showed ill characterized lesion surrounded by inflammation.  Because the patient's high risk and the fact that the nodule has grown over the course of a year he has been referred to thoracic surgery for evaluation for potential wedge resection.  The patient is in agreement with thoracic surgery evaluation.  He has been referred to Dr. Nestor Lewandowsky.  Pulmonary function testing of 1 September was relatively benign.  Mild restrictive physiology suggested likely on the basis of obesity (ERV 23%).  2.  Mild persistent asthma: Trial of Arnuity Ellipta 100 mcg daily.  Patient was instructed on the use of the inhaler.  It was recommended also that he discontinue smoking.  3.  Essential hypertension:  He is better controlled in this regard however, may need reevaluation by his primary care physician to optimize his medications.  4  Gastroesophageal reflux disease: This issue adds complexity to his management.  5.  Tobacco dependence due to cigarettes:  Patient was counseled again with regards to discontinuation of smoking total time 3 to 5 minutes.   This chart was dictated using voice recognition software/Dragon.  Despite best efforts to proofread, errors can occur which can change the meaning.  Any change was purely unintentional.

## 2018-11-30 NOTE — Op Note (Signed)
Electromagnetic Navigation Bronchoscopy:   Indication: LEFT lung mass/nodule;superior segment left lower lobe  Preoperative Diagnosis: LEFT lung nodule/mass Post Procedure Diagnosis: Same as above   Consent: Verbal/Written  Benefits, limitations and potential complications of the procedure were discussed with the patient/family  including, but not limited to bleeding, hemoptysis, respiratory failure requiring intubation and/or prolongued mechanical ventilation, infection, pneumothorax (collapse of lung) requiring chest tube placement, stroke from air embolism or even death.  Patient agreed to proceed.  Consent signed.  Operator: Patsey Berthold Scrub: Dina Rich, RT Circulator: Annia Belt, RT  Type of Anesthesia: General anesthesia, see anesthesia records  Procedure Performed:  Virtual Bronchoscopy with Multi-planar Image analysis, 3-D reconstruction of coronal, sagittal and multi-planar images for the purposes of planning real-time bronchoscopy using the iLogic Electromagnetic Navigation Bronchoscopy System (superDimension).  In addition endo-microscopy was performed.  Description of Procedure: Patient was taken to Procedure Room 2 in the OR where appropriate timeout was taken with the staff.  He was inducted under general anesthesia and intubated by CRNA/anesthesiologist.  Please refer to those records for details.  Patient was intubated with a 8.5 ET tube without difficulty.  A Portex adapter was placed on the flange of the ET tube.  Once the patient had achieved adequate general anesthesia the Olympus video bronchoscope was advanced through the existing Portex adapter.  Anatomic survey of the airways was then performed.  Visible portions of the trachea were normal.  Carina was sharp.  Progressing into the right mainstem bronchus the right tracheobronchial tree showed no evidence of lesions or cobblestoning.  The patient did have copious thick secretions that were lavaged till clear.  At this  point the bronchoscope was brought to the left and in similar fashion the left tracheobronchial tree was examined.  No endobronchial lesions were noted.  There was narrowing of the most medial subsegment of the LEFT lower lobe superior subsegment.  Patient had secretions on the left as noted previously on the right these were thick and were lavaged till clear.  At this point the extended working channel and locatable guide (LG) probe were introduced into the working channel of the bronchoscope.The LG was directed to standard registration points at the following centers: main carina, right upper lobe bronchus, right lower lobe bronchus, right middle lobe bronchus, left upper lobe bronchus, and the left lower lobe bronchus. This data was transferred to the i-Logic ENB system for real-time bronchoscopy and concordance with the CT scan.  Once registration was performed, the bronchoscope was advanced to the superior segment of the LEFT lower lobe and wedged into position.  The locatable guide was then navigated to the target on the LEFT lower lobe superior subsegment for tissue sampling.  The lesion was not readily visible on fluoroscopy, at this point Cellvizio endo-microscopy was performed and there appeared to be significant inflammation of the area.  The Cellvizio probe was then removed and locatable guide placed again and confirmation with target acquisition was done 1 more time.  Cellvizio was then utilized again indicating abnormal tissue with significant inflammation.  However it could not be characterized fully as malignant.  At this point utilizing fluoroscopy for guidance brushings were performed of this area.  A total of 4 passes were performed with the brush.  Once brushings were completed biopsies of the area were performed.  After the second biopsy, there were some issues with the anesthesia equipment and the procedure had to be placed on hold until troubleshooting of the anesthesia machine was done.  Once  this was corrected the procedure continued.  4 more specimens were obtained with super dimension forceps but these were changed for ConMed precise or forceps to get better tissue.  A total of 10 biopsies were performed with fluoroscopic guidance.  These were all in the LEFT lower lobe superior segment.  Once this was completed and another brushing was performed of the area and a brush was placed on CytoLyt.  Targeted bronchoalveolar lavage was then performed with 40 mL's of saline instilled yielding 8 mL's of aliquot (20 mL instilled x2).  The bronchoalveolar lavage fluid will be sent for cytology review.  Once this was completed, the airway was inspected and good hemostasis was noted.  At this point the bronchoscope was retrieved and the seizure was terminated.  Patient was allowed to emerge from general anesthesia and was extubated in the procedure room and taken to the PACU in satisfactory condition.  Postprocedure chest x-ray showed no pneumothorax.  Patient tolerated the procedure well.   Specimens Obtained:  Transbronchial Forceps Biopsy: X10  Transbronchial Brush: X 5 passes (2 cut brushes to cytolyt)  Targeted bronchoalveolar lavage: Aliquot 8 mL's   Fluoroscopy:  Fluoroscopy was utilized during the course of this procedure to assure that biopsies were taken in a safe manner under fluoroscopic guidance with spot films required.   Complications:None, postprocedure chest x-ray showed no pneumothorax.  Estimated Blood Loss: Less than 5 mL's   Assessment and Plan/Additional Comments:  1.  LEFT lower lobe superior segment nodule with inflammatory characteristics cannot rule out carcinoma.   Patient's  significant other was notified of findings above as per patient's request.  Patient may need referral to thoracic surgery for excision of the nodule.  Follow up on pathology reports  C. Derrill Kay, MD [place] [facility] Advanced Bronchoscopy

## 2018-11-30 NOTE — Patient Instructions (Signed)
1.  We will start you on Arnuity Ellipta 1 inhalation daily, you are noted to have significant wheezing during the procedure.  I suspect this is an element of asthma.  2.  Rinse your mouth well after the use of Arnuity.  3.  We will see you in 3 months time, call sooner should any new difficulties arise.

## 2018-12-01 ENCOUNTER — Other Ambulatory Visit: Payer: Self-pay | Admitting: Family Medicine

## 2018-12-01 NOTE — Telephone Encounter (Signed)
Requested medication (s) are due for refill today: yes  Requested medication (s) are on the active medication list: yes  Last refill:  08/31/2018  Future visit scheduled: yes  Notes to clinic:  Review for refill   Requested Prescriptions  Pending Prescriptions Disp Refills   metoprolol succinate (TOPROL-XL) 25 MG 24 hr tablet [Pharmacy Med Name: METOPROLOL SUCCINATE ER 25 MG TAB] 90 tablet 1    Sig: TAKE 1 TABLET BY MOUTH WITH A 100 MG TABLET FOR A TOTAL OF 125 MG DAILY     Cardiovascular:  Beta Blockers Failed - 12/01/2018  9:25 AM      Failed - Last BP in normal range    BP Readings from Last 1 Encounters:  11/30/18 (!) 148/90         Passed - Last Heart Rate in normal range    Pulse Readings from Last 1 Encounters:  11/30/18 77         Passed - Valid encounter within last 6 months    Recent Outpatient Visits          1 month ago Type 2 diabetes mellitus with stage 1 chronic kidney disease, without long-term current use of insulin ([place])   [place], [name], DO   2 months ago Benign hypertensive renal disease   [facility] [place], [name], DO   3 months ago Benign hypertensive renal disease   [facility] [place], [name], DO   4 months ago Type 2 diabetes mellitus with stage 1 chronic kidney disease, without long-term current use of insulin ([place])   [place], [name], DO   7 months ago COPD exacerbation ([place])   [facility] [place], [employer], DO      Future Appointments            In 4 months [place], [name], DO [name]            metoprolol succinate (TOPROL-XL) 100 MG 24 hr tablet [Pharmacy Med Name: METOPROLOL SUCCINATE ER 100 MG TAB] 90 tablet     Sig: TAKE 1 TABLET BY MOUTH AT BEDTIME WITH THE 25 MG TO EQUAL 125 MG ONCEDAILY     Cardiovascular:  Beta Blockers Failed - 12/01/2018  9:25 AM      Failed - Last BP in normal range    BP Readings from Last 1  Encounters:  11/30/18 (!) 148/90         Passed - Last Heart Rate in normal range    Pulse Readings from Last 1 Encounters:  11/30/18 77         Passed - Valid encounter within last 6 months    Recent Outpatient Visits          1 month ago Type 2 diabetes mellitus with stage 1 chronic kidney disease, without long-term current use of insulin ([place])   [place], [name], DO   2 months ago Benign hypertensive renal disease   [facility] [place], [name], DO   3 months ago Benign hypertensive renal disease   [facility] [place], [name], DO   4 months ago Type 2 diabetes mellitus with stage 1 chronic kidney disease, without long-term current use of insulin ([place])   [place], [name], DO   7 months ago COPD exacerbation [facility])   [place], [name], DO      Future Appointments  In 4 months Wynetta Emery, Barb Merino, DO [name]

## 2018-12-01 NOTE — Telephone Encounter (Signed)
Routing to provider  

## 2018-12-03 ENCOUNTER — Other Ambulatory Visit: Payer: Self-pay

## 2018-12-03 ENCOUNTER — Encounter: Payer: Self-pay | Admitting: Cardiothoracic Surgery

## 2018-12-03 ENCOUNTER — Telehealth: Payer: Self-pay

## 2018-12-03 ENCOUNTER — Ambulatory Visit (INDEPENDENT_AMBULATORY_CARE_PROVIDER_SITE_OTHER): Payer: Medicare Other | Admitting: Cardiothoracic Surgery

## 2018-12-03 VITALS — BP 172/111 | HR 79 | Temp 97.9°F | Resp 14 | Ht 70.0 in | Wt 228.2 lb

## 2018-12-03 DIAGNOSIS — R911 Solitary pulmonary nodule: Secondary | ICD-10-CM | POA: Diagnosis not present

## 2018-12-03 NOTE — Patient Instructions (Addendum)
Please try to Stop smoking. I will call you later today to let you know when the CT biopsy will be done.

## 2018-12-03 NOTE — Telephone Encounter (Signed)
Spoke with Janet-speciality scheduling for CT lung guided biopsy-Janet will call when she has an appointment.

## 2018-12-03 NOTE — Progress Notes (Signed)
Patient ID: Kristopher [name], male   DOB: [birth year], 58 y.o.   MRN: [id number]  Chief Complaint  Patient presents with  . New Patient (Initial Visit)    LLL nodule    Referred By Dr. Delight Hoh and Dr. Ilda Basset Reason for Referral left lower lobe mass  HPI Location, Quality, Duration, Severity, Timing, Context, Modifying Factors, Associated Signs and Symptoms.  Kristopher [name] is a 58 y.o. male.  He is a lifelong smoker having smoked for approximately 30 years a pack cigarettes per day.  He was participating in the lung screening program and had a CT scan made last year which revealed bilateral pulmonary nodules the most dominant of which was in the left lower lobe.  He then had a repeat CT 1 year later which revealed an increase from approximately 5 to 10 mm in the left lower lobe nodule.  He had a PET scan done which revealed faint uptake in the nodule but no evidence of distant disease.  He then underwent a bronchoscopy which was negative for malignancy.  He is sent here now for consideration of surgical therapy.  He did have some pulmonary function studies done and his FEV1 and DLCO are essentially normal.  They are respectively 90% and 98% of normal.  He is a [employer].  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  Because that he suffered multiple orthopedic related illnesses and injuries.  He has had multiple surgeries on his back and hips.  He has chronic pain and requires opioids for that.  He is currently being managed with our pain management clinic for his chronic pain syndrome.  He has a father who died of lung cancer.  It is not clear if this was the typical type of lung cancer or it was a mesothelioma.  He has other family members with malignancies as well.  He has been smoking all of his life and is quit intermittently but currently smokes about a pack cigarettes per day.  He does not complain of any significant shortness of breath  although his back pain and leg pain limit his overall mobility.   Past Medical History:  Diagnosis Date  . Allergy   . Arthritis    left foot  . Benign hypertensive kidney disease   . Chronic back pain    Four rods in back  . Diabetes mellitus, type 2 ([place])   . Dyspnea   . GERD (gastroesophageal reflux disease)   . Hypertension   . Migraines    daily    Past Surgical History:  Procedure Laterality Date  . APPENDECTOMY    . BACK SURGERY    . COLONOSCOPY WITH PROPOFOL N/A 02/19/2016   Procedure: COLONOSCOPY WITH PROPOFOL;  Surgeon: Lucilla Lame, MD;  Location: [place];  Service: Endoscopy;  Laterality: N/A;  . COLONOSCOPY WITH PROPOFOL N/A 01/19/2018   Procedure: COLONOSCOPY WITH PROPOFOL;  Surgeon: Lucilla Lame, MD;  Location: [place];  Service: Endoscopy;  Laterality: N/A;  Diabetic - oral meds  . DG OPERATIVE LEFT HIP ([place] HX)     10/19  . ELECTROMAGNETIC NAVIGATION BROCHOSCOPY Left 11/18/2018   Procedure: ELECTROMAGNETIC NAVIGATION BRONCHOSCOPY;  Surgeon: Tyler Pita, MD;  Location: [facility];  Service: Cardiopulmonary;  Laterality: Left;  . FOOT SURGERY Left    Screws and plates  . JOINT REPLACEMENT Left 12/2017   DR Rudene Christians Hip  . KNEE SURGERY Left  X 2  . LEG SURGERY    . POLYPECTOMY N/A 02/19/2016   Procedure: POLYPECTOMY;  Surgeon: Lucilla Lame, MD;  Location: [place];  Service: Endoscopy;  Laterality: N/A;  . POLYPECTOMY  01/19/2018   Procedure: POLYPECTOMY;  Surgeon: Lucilla Lame, MD;  Location: [place];  Service: Endoscopy;;  . TOTAL HIP ARTHROPLASTY Left 12/02/2017   Procedure: TOTAL HIP ARTHROPLASTY ANTERIOR APPROACH;  Surgeon: Hessie Knows, MD;  Location: [facility];  Service: Orthopedics;  Laterality: Left;    Family History  Problem Relation Age of Onset  . Cancer Father   . Diabetes Sister   . Thrombosis Sister     Social History Social History   Tobacco Use  . Smoking status: Current Every Day  Smoker    Packs/day: 1.00    Years: 35.00    Pack years: 35.00    Types: Cigarettes  . Smokeless tobacco: Never Used  Substance Use Topics  . Alcohol use: No    Alcohol/week: 0.0 standard drinks  . Drug use: Yes    Types: Oxycodone    Comment: prescribed    Allergies  Allergen Reactions  . Aspirin Anaphylaxis  . Epinephrine Anaphylaxis  . Lidocaine Anaphylaxis  . Novocain [Procaine] Anaphylaxis  . Penicillins Anaphylaxis    Has patient had a PCN reaction causing immediate rash, facial/tongue/throat swelling, SOB or lightheadedness with hypotension: Yes Has patient had a PCN reaction causing severe rash involving mucus membranes or skin necrosis: No Has patient had a PCN reaction that required hospitalization: Yes Has patient had a PCN reaction occurring within the last 10 years: No If all of the above answers are "NO", then may proceed with Cephalosporin use.   . Strawberry Extract Anaphylaxis  . Shellfish Allergy Nausea And Vomiting    hives    Current Outpatient Medications  Medication Sig Dispense Refill  . ACCU-CHEK GUIDE test strip USE TO TEST BLOOD SUGAR 2X A DAY    . albuterol (PROVENTIL HFA;VENTOLIN HFA) 108 (90 Base) MCG/ACT inhaler Inhale 2 puffs into the lungs every 6 (six) hours as needed for wheezing or shortness of breath. 1 Inhaler 3  . atorvastatin (LIPITOR) 10 MG tablet TAKE 1 TABLET BY MOUTH EVERY DAY 90 tablet 1  . esomeprazole (NEXIUM) 40 MG capsule Take 1 capsule (40 mg total) by mouth daily. (Patient taking differently: Take 40 mg by mouth 3 times/day as needed-between meals & bedtime. ) 90 capsule 3  . Fluticasone Furoate (ARNUITY ELLIPTA) 100 MCG/ACT AEPB Inhale 1 puff into the lungs daily. 30 each 6  . hydrALAZINE (APRESOLINE) 100 MG tablet Take 1 tablet (100 mg total) by mouth 2 (two) times daily. 180 tablet 1  . metFORMIN (GLUCOPHAGE-XR) 500 MG 24 hr tablet Take 1 tablet (500 mg total) by mouth 2 (two) times daily with a meal. 180 tablet 0  .  metoprolol succinate (TOPROL-XL) 100 MG 24 hr tablet 125 mg in the morning, and 125 mg in the evening (to be taken with the 25mg  pill to equal 125mg ) 180 tablet 1  . metoprolol succinate (TOPROL-XL) 25 MG 24 hr tablet To be taken with the 100mg  for a total of 125mg  BID 180 tablet 1  . montelukast (SINGULAIR) 10 MG tablet Take 1 tablet (10 mg total) by mouth at bedtime. 30 tablet 3  . NONFORMULARY OR COMPOUNDED ITEM 10% Ketamine/2% Cyclobenzaprine/6% Gabapentin Cream Sig: 1-2 ml to affected area 3-4 times/day. Amount: 240 GM 240 each 2  . nortriptyline (PAMELOR) 25 MG capsule Take 2  capsules (50 mg total) by mouth at bedtime. 60 capsule 3  . Oxycodone HCl 10 MG TABS     . potassium chloride (KLOR-CON) 20 MEQ packet Take 20 mEq by mouth daily.    . SUMAtriptan (IMITREX) 100 MG tablet TAKE ONE TABLET AT ONSET OF HEADACHE MAY REPEAT IN TWO HOURS IF NECESSARY LIMIT OF TWO IN 24 HOURS 10 tablet 12   No current facility-administered medications for this visit.       Review of Systems A complete review of systems was asked and was negative except for the following positive findings most of his positive findings are related to his chronic back and hip issues  Blood pressure (!) 172/111, pulse 79, temperature 97.9 F (36.6 C), temperature source Temporal, resp. rate 14, height 5\' 10"  (1.778 m), weight 228 lb 3.2 oz (103.5 kg), SpO2 98 %.  Physical Exam CONSTITUTIONAL:  Pleasant, well-developed, well-nourished, and in no acute distress. EYES: Pupils equal and reactive to light, Sclera non-icteric EARS, NOSE, MOUTH AND THROAT:  The oropharynx was clear.  Dentition is poor repair.  Oral mucosa pink and moist. LYMPH NODES:  Lymph nodes in the neck and axillae were normal RESPIRATORY:  Lungs were clear.  Normal respiratory effort without pathologic use of accessory muscles of respiration CARDIOVASCULAR: Heart was regular without murmurs.  There were no carotid bruits. GI: The abdomen was soft,  nontender, and nondistended. There were no palpable masses. There was no hepatosplenomegaly. There were normal bowel sounds in all quadrants. GU:  Rectal deferred.   MUSCULOSKELETAL:  Normal muscle strength and tone.  No clubbing or cyanosis.   SKIN:  There were no pathologic skin lesions.  There were no nodules on palpation. NEUROLOGIC:  Sensation is normal.  Cranial nerves are grossly intact. PSYCH:  Oriented to person, place and time.  Mood and affect are normal.  Data Reviewed CT scan and PET scans  I have personally reviewed the patient's imaging, laboratory findings and medical records.    Assessment    Dominant left lower lobe nodule most consistent with a malignancy    Plan    I had a long discussion with the patient today regarding the options.  I told him I did believe that this was a slow-growing lung cancer and that my recommendation be surgical therapy for management.  He understands that this will require a lobectomy given its location.  I also reviewed with him the possibility of performing a CT-guided needle biopsy and he would like to proceed down that path prior to any thoracotomy.  I discussed with him the rationale for biopsy as opposed to going straight to the operating room.  I did explain to him that I thought that this was a malignancy and that he should attempt to stop smoking prior to surgery.  He states that he is agreeable to doing so.  He did want to pursue a CT-guided needle biopsy however prior to any surgical intervention.  His main concern is pain postoperatively and he understands that we will need to have our pain management service involved in his care.  He states that he has anaphylaxis from the local anesthetics and this will rule out some of the main therapies were used for control pain postoperatively.       Nestor Lewandowsky, MD 12/03/2018, 8:24 AM

## 2018-12-04 ENCOUNTER — Telehealth: Payer: Self-pay

## 2018-12-04 LAB — SURGICAL PATHOLOGY

## 2018-12-04 LAB — CYTOLOGY - NON PAP

## 2018-12-04 NOTE — Telephone Encounter (Signed)
Left message for Kristopher [name] at pre-admit testing to schedule covid testing [phone]-patient MRN provided and ask her to call patient and arrange covid testing. Patient CT lung guided BX 12/16/2018 @ 10 am.

## 2018-12-04 NOTE — Telephone Encounter (Signed)
CT lung guided biopsy scheduled 12/16/2018 @ [place]. Check in at registration at 9:45 am.

## 2018-12-08 ENCOUNTER — Telehealth: Payer: Self-pay

## 2018-12-08 NOTE — Telephone Encounter (Signed)
Patient notified of covid testing 12/11/2018.

## 2018-12-09 LAB — CYTOLOGY - NON PAP

## 2018-12-10 NOTE — Progress Notes (Signed)
Patient scheduled for Lung Biopsy on 12/16/2018, called to go over pre procedure instructions with patient with questions answered. Aware to get COVID testing on 12/11/2018 and isolate pre procedure. Patient aware to be here @ 1000 on 12/16/2018. Stated understanding of instructions.

## 2018-12-11 ENCOUNTER — Other Ambulatory Visit
Admission: RE | Admit: 2018-12-11 | Discharge: 2018-12-11 | Disposition: A | Discharge status: Home / Self Care | Payer: Medicare Other | Source: Ambulatory Visit | Attending: Pulmonary Disease | Admitting: Pulmonary Disease

## 2018-12-11 ENCOUNTER — Other Ambulatory Visit: Payer: Self-pay

## 2018-12-11 DIAGNOSIS — Z01812 Encounter for preprocedural laboratory examination: Secondary | ICD-10-CM | POA: Diagnosis not present

## 2018-12-11 DIAGNOSIS — Z20828 Contact with and (suspected) exposure to other viral communicable diseases: Secondary | ICD-10-CM | POA: Insufficient documentation

## 2018-12-11 LAB — SARS CORONAVIRUS 2 (TAT 6-24 HRS): SARS Coronavirus 2: NEGATIVE

## 2018-12-15 ENCOUNTER — Other Ambulatory Visit: Payer: Self-pay | Admitting: Radiology

## 2018-12-16 ENCOUNTER — Ambulatory Visit
Admission: RE | Admit: 2018-12-16 | Discharge: 2018-12-16 | Disposition: A | Discharge status: Home / Self Care | Payer: Medicare Other | Source: Ambulatory Visit | Attending: Cardiothoracic Surgery | Admitting: Cardiothoracic Surgery

## 2018-12-16 ENCOUNTER — Other Ambulatory Visit: Payer: Self-pay

## 2018-12-16 ENCOUNTER — Ambulatory Visit
Admission: RE | Admit: 2018-12-16 | Discharge: 2018-12-16 | Disposition: A | Discharge status: Home / Self Care | Payer: Medicare Other | Source: Ambulatory Visit | Attending: Interventional Radiology | Admitting: Interventional Radiology

## 2018-12-16 DIAGNOSIS — R911 Solitary pulmonary nodule: Secondary | ICD-10-CM | POA: Diagnosis not present

## 2018-12-16 DIAGNOSIS — J939 Pneumothorax, unspecified: Secondary | ICD-10-CM | POA: Diagnosis not present

## 2018-12-16 DIAGNOSIS — K219 Gastro-esophageal reflux disease without esophagitis: Secondary | ICD-10-CM | POA: Diagnosis not present

## 2018-12-16 DIAGNOSIS — J95811 Postprocedural pneumothorax: Secondary | ICD-10-CM | POA: Diagnosis not present

## 2018-12-16 DIAGNOSIS — J439 Emphysema, unspecified: Secondary | ICD-10-CM | POA: Diagnosis not present

## 2018-12-16 DIAGNOSIS — I129 Hypertensive chronic kidney disease with stage 1 through stage 4 chronic kidney disease, or unspecified chronic kidney disease: Secondary | ICD-10-CM | POA: Insufficient documentation

## 2018-12-16 DIAGNOSIS — Z7984 Long term (current) use of oral hypoglycemic drugs: Secondary | ICD-10-CM | POA: Diagnosis not present

## 2018-12-16 DIAGNOSIS — I700 Atherosclerosis of aorta: Secondary | ICD-10-CM | POA: Insufficient documentation

## 2018-12-16 DIAGNOSIS — C3432 Malignant neoplasm of lower lobe, left bronchus or lung: Secondary | ICD-10-CM | POA: Diagnosis not present

## 2018-12-16 DIAGNOSIS — E1122 Type 2 diabetes mellitus with diabetic chronic kidney disease: Secondary | ICD-10-CM | POA: Insufficient documentation

## 2018-12-16 DIAGNOSIS — Z79899 Other long term (current) drug therapy: Secondary | ICD-10-CM | POA: Diagnosis not present

## 2018-12-16 DIAGNOSIS — F17210 Nicotine dependence, cigarettes, uncomplicated: Secondary | ICD-10-CM | POA: Insufficient documentation

## 2018-12-16 LAB — CBC WITH DIFFERENTIAL/PLATELET
Abs Immature Granulocytes: 0.04 10*3/uL (ref 0.00–0.07)
Basophils Absolute: 0.1 10*3/uL (ref 0.0–0.1)
Basophils Relative: 1 %
Eosinophils Absolute: 0.1 10*3/uL (ref 0.0–0.5)
Eosinophils Relative: 2 %
HCT: 40.2 % (ref 39.0–52.0)
Hemoglobin: 13.4 g/dL (ref 13.0–17.0)
Immature Granulocytes: 1 %
Lymphocytes Relative: 37 %
Lymphs Abs: 2.5 10*3/uL (ref 0.7–4.0)
MCH: 29.5 pg (ref 26.0–34.0)
MCHC: 33.3 g/dL (ref 30.0–36.0)
MCV: 88.4 fL (ref 80.0–100.0)
Monocytes Absolute: 0.5 10*3/uL (ref 0.1–1.0)
Monocytes Relative: 7 %
Neutro Abs: 3.5 10*3/uL (ref 1.7–7.7)
Neutrophils Relative %: 52 %
Platelets: 171 10*3/uL (ref 150–400)
RBC: 4.55 MIL/uL (ref 4.22–5.81)
RDW: 13.1 % (ref 11.5–15.5)
WBC: 6.8 10*3/uL (ref 4.0–10.5)
nRBC: 0 % (ref 0.0–0.2)

## 2018-12-16 LAB — BASIC METABOLIC PANEL
Anion gap: 7 (ref 5–15)
BUN: 12 mg/dL (ref 6–20)
CO2: 27 mmol/L (ref 22–32)
Calcium: 9 mg/dL (ref 8.9–10.3)
Chloride: 103 mmol/L (ref 98–111)
Creatinine, Ser: 0.85 mg/dL (ref 0.61–1.24)
GFR calc Af Amer: >60 mL/min (ref >60)
GFR calc non Af Amer: >60 mL/min (ref >60)
Glucose, Bld: 185 mg/dL — ABNORMAL HIGH (ref 70–99)
Potassium: 3.7 mmol/L (ref 3.5–5.1)
Sodium: 137 mmol/L (ref 135–145)

## 2018-12-16 LAB — PROTIME-INR
INR: 1.1 (ref 0.8–1.2)
Prothrombin Time: 14.4 seconds (ref 11.4–15.2)

## 2018-12-16 LAB — GLUCOSE, CAPILLARY: Glucose-Capillary: 170 mg/dL — ABNORMAL HIGH (ref 70–99)

## 2018-12-16 MED ORDER — SODIUM CHLORIDE 0.9 % IV SOLN
INTRAVENOUS | Status: DC
  Administered 2018-12-16: 11:00:00 via INTRAVENOUS

## 2018-12-16 MED ORDER — FENTANYL CITRATE (PF) 100 MCG/2ML IJ SOLN
INTRAMUSCULAR | Status: AC | PRN
  Administered 2018-12-16 (×2): 50 ug via INTRAVENOUS
  Administered 2018-12-16: 25 ug via INTRAVENOUS

## 2018-12-16 MED ORDER — MIDAZOLAM HCL 2 MG/2ML IJ SOLN
INTRAMUSCULAR | Status: AC | PRN
  Administered 2018-12-16 (×3): 1 mg via INTRAVENOUS

## 2018-12-16 MED ORDER — MIDAZOLAM HCL 5 MG/5ML IJ SOLN
INTRAMUSCULAR | Status: AC
  Filled 2018-12-16: qty 5

## 2018-12-16 MED ORDER — FENTANYL CITRATE (PF) 100 MCG/2ML IJ SOLN
INTRAMUSCULAR | Status: AC
  Filled 2018-12-16: qty 4

## 2018-12-16 NOTE — Procedures (Signed)
Interventional Radiology Procedure Note  Procedure: CT guided biopsy of LLL nodule.  Ice pack was used as local anesthetic, as he has severe allergy.  This made targeting difficult.   Complications: None  Recommendations: - Bedrest until CXR cleared.  Minimize talking, coughing or otherwise straining.  - Follow up 1 hr CXR pending  - NPO until CXR cleared  Signed,  Corrie Mckusick, DO

## 2018-12-16 NOTE — H&P (Signed)
Chief Complaint: LLL lung nodule  Referring Physician(s): Oaks,Timothy  History of Present Illness: Kristopher [name] is a 58 y.o. male presenting for LLL nodule biopsy.    Kristopher [name] denies any new symptoms, specifically fever, rigors, chills.   He tells me his allergy to local anesthetics is legitimate, citing prior anaphylactic reaction.    Past Medical History:  Diagnosis Date  . Allergy   . Arthritis    left foot  . Benign hypertensive kidney disease   . Chronic back pain    Four rods in back  . Diabetes mellitus, type 2 ([place])   . Dyspnea   . GERD (gastroesophageal reflux disease)   . Hypertension   . Migraines    daily    Past Surgical History:  Procedure Laterality Date  . APPENDECTOMY    . BACK SURGERY    . COLONOSCOPY WITH PROPOFOL N/A 02/19/2016   Procedure: COLONOSCOPY WITH PROPOFOL;  Surgeon: Lucilla Lame, MD;  Location: [facility];  Service: Endoscopy;  Laterality: N/A;  . COLONOSCOPY WITH PROPOFOL N/A 01/19/2018   Procedure: COLONOSCOPY WITH PROPOFOL;  Surgeon: Lucilla Lame, MD;  Location: [facility];  Service: Endoscopy;  Laterality: N/A;  Diabetic - oral meds  . DG OPERATIVE LEFT HIP ([place] [facility])     10/19  . ELECTROMAGNETIC NAVIGATION BROCHOSCOPY Left 11/18/2018   Procedure: ELECTROMAGNETIC NAVIGATION BRONCHOSCOPY;  Surgeon: Tyler Pita, MD;  Location: [facility];  Service: Cardiopulmonary;  Laterality: Left;  . FOOT SURGERY Left    Screws and plates  . JOINT REPLACEMENT Left 12/2017   DR Rudene Christians Hip  . KNEE SURGERY Left    X 2  . LEG SURGERY    . POLYPECTOMY N/A 02/19/2016   Procedure: POLYPECTOMY;  Surgeon: Lucilla Lame, MD;  Location: [facility];  Service: Endoscopy;  Laterality: N/A;  . POLYPECTOMY  01/19/2018   Procedure: POLYPECTOMY;  Surgeon: Lucilla Lame, MD;  Location: [place];  Service: Endoscopy;;  . TOTAL HIP ARTHROPLASTY Left 12/02/2017   Procedure: TOTAL HIP ARTHROPLASTY ANTERIOR APPROACH;   Surgeon: Hessie Knows, MD;  Location: [facility];  Service: Orthopedics;  Laterality: Left;    Allergies: Aspirin, Epinephrine, Lidocaine, Novocain [procaine], Penicillins, Strawberry extract, and Shellfish allergy  Medications: Prior to Admission medications   Medication Sig Start Date End Date Taking? Authorizing Provider  ACCU-CHEK GUIDE test strip USE TO TEST BLOOD SUGAR 2X A DAY 07/16/18  Yes [provider]  albuterol (PROVENTIL HFA;VENTOLIN HFA) 108 (90 Base) MCG/ACT inhaler Inhale 2 puffs into the lungs every 6 (six) hours as needed for wheezing or shortness of breath. 05/01/18  Yes Johnson, [name], DO  atorvastatin (LIPITOR) 10 MG tablet TAKE 1 TABLET BY MOUTH EVERY DAY 10/08/18  Yes Johnson, [name], DO  esomeprazole (NEXIUM) 40 MG capsule Take 1 capsule (40 mg total) by mouth daily. Patient taking differently: Take 40 mg by mouth 3 times/day as needed-between meals & bedtime.  10/01/17  Yes Johnson, [name], DO  Fluticasone Furoate (ARNUITY ELLIPTA) 100 MCG/ACT AEPB Inhale 1 puff into the lungs daily. 11/30/18  Yes Tyler Pita, MD  hydrALAZINE (APRESOLINE) 100 MG tablet Take 1 tablet (100 mg total) by mouth 2 (two) times daily. 09/22/18  Yes Johnson, [name], DO  metFORMIN (GLUCOPHAGE-XR) 500 MG 24 hr tablet Take 1 tablet (500 mg total) by mouth 2 (two) times daily with a meal. 10/06/18  Yes Johnson, [name], DO  metoprolol succinate (TOPROL-XL) 100 MG 24 hr tablet  125 mg in the morning, and 125 mg in the evening (to be taken with the 25mg  pill to equal 125mg ) 11/28/18  Yes Wynetta Emery, [name], DO  metoprolol succinate (TOPROL-XL) 25 MG 24 hr tablet To be taken with the 100mg  for a total of 125mg  BID 12/01/18  Yes Johnson, [name], DO  montelukast (SINGULAIR) 10 MG tablet Take 1 tablet (10 mg total) by mouth at bedtime. 09/22/18  Yes Johnson, [name], DO  NONFORMULARY OR COMPOUNDED ITEM 10% Ketamine/2% Cyclobenzaprine/6% Gabapentin Cream Sig: 1-2 ml to affected area 3-4 times/day.  Amount: 240 GM 10/21/18 01/06/19 Yes [name], MD  nortriptyline (PAMELOR) 25 MG capsule Take 2 capsules (50 mg total) by mouth at bedtime. 09/22/18  Yes Johnson, [name], DO  Oxycodone HCl 10 MG TABS  11/27/18  Yes [provider]  potassium chloride (KLOR-CON) 20 MEQ packet Take 20 mEq by mouth daily.   Yes [provider]  SUMAtriptan (IMITREX) 100 MG tablet TAKE ONE TABLET AT ONSET OF HEADACHE MAY REPEAT IN TWO HOURS IF NECESSARY LIMIT OF TWO IN 24 HOURS 12/05/17  Yes Johnson, [name], DO     Family History  Problem Relation Age of Onset  . Cancer Father   . Diabetes Sister   . Thrombosis Sister     Social History   Socioeconomic History  . Marital status: Significant Other    Spouse name: Not on file  . Number of children: Not on file  . Years of education: Not on file  . Highest education level: Not on file  Occupational History  . Occupation: disability  Social Needs  . Financial resource strain: Not hard at all  . Food insecurity    Worry: Never true    Inability: Never true  . Transportation needs    Medical: No    Non-medical: No  Tobacco Use  . Smoking status: Current Every Day Smoker    Packs/day: 1.00    Years: 35.00    Pack years: 35.00    Types: Cigarettes  . Smokeless tobacco: Never Used  Substance and Sexual Activity  . Alcohol use: No    Alcohol/week: 0.0 standard drinks  . Drug use: Yes    Types: Oxycodone    Comment: prescribed  . Sexual activity: Yes  Lifestyle  . Physical activity    Days per week: 0 days    Minutes per session: 0 min  . Stress: Not at all  Relationships  . Social connections    Talks on phone: More than three times a week    Gets together: More than three times a week    Attends religious service: More than 4 times per year    Active member of club or organization: No    Attends meetings of clubs or organizations: Never    Relationship status: Living with partner  Other Topics Concern  . Not on  file  Social History Narrative  . Not on file       Review of Systems: A 12 point ROS discussed and pertinent positives are indicated in the HPI above.  All other systems are negative.  Review of Systems  Vital Signs: BP (!) 159/101 (BP Location: Right Arm)   Pulse 64   Temp 98.5 F (36.9 C) (Oral)   Resp 15   Ht 5\' 10"  (1.778 m)   Wt 100.2 kg   SpO2 98%   BMI 31.71 kg/m   Physical Exam General: 58 yo male appearing stated age.  Well-developed, well-nourished.  No distress. HEENT: Atraumatic, normocephalic.  Conjugate gaze, extra-ocular motor intact. No scleral icterus or scleral injection. No lesions on external ears, nose, lips, or gums.  Oral mucosa moist, pink.  Neck: Symmetric with no goiter enlargement.  Chest/Lungs:  Symmetric chest with inspiration/expiration.  No labored breathing.  Clear to auscultation with no wheezes, rhonchi, or rales.  Heart:  RRR, with no third heart sounds appreciated. No JVD appreciated.  Abdomen:  Soft, NT/ND, with + bowel sounds.   Genito-urinary: Deferred Neurologic: Alert & Oriented to person, place, and time.   Normal affect and insight.  Appropriate questions.  Moving all 4 extremities with gross sensory intact.     Mallampati Score:     Imaging: Dg Chest Port 1 View  Result Date: 11/18/2018 CLINICAL DATA:  Lung nodule status post bronchoscopy EXAM: PORTABLE CHEST 1 VIEW COMPARISON:  CT 11/17/2018 FINDINGS: The heart size and mediastinal contours are stable. Lung volumes are low with crowding of the central bronchovascular markings. No definite focal new airspace consolidation. No pleural effusion or pneumothorax. IMPRESSION: No pneumothorax. Electronically Signed   By: Davina Poke M.D.   On: 11/18/2018 13:26   Dg C-arm 1-60 Min-no Report  Result Date: 11/18/2018 Fluoroscopy was utilized by the requesting physician.  No radiographic interpretation.   Ct Super D Chest Wo Contrast  Result Date: 11/17/2018 CLINICAL DATA:  lung  mass-surgical planning. EXAM: CT CHEST WITHOUT CONTRAST TECHNIQUE: Multidetector CT imaging of the chest was performed using thin slice collimation for electromagnetic bronchoscopy planning purposes, without intravenous contrast. COMPARISON:  10/16/2018 FINDINGS: Cardiovascular: The heart size is normal. No pericardial effusion. Aortic atherosclerosis. Mediastinum/Nodes: No enlarged mediastinal, hilar, or axillary lymph nodes. Thyroid gland, trachea, and esophagus demonstrate no significant findings. Lungs/Pleura: No pleural effusion identified. Mild emphysema. No airspace consolidation, atelectasis or pneumothorax. Pulmonary nodule in the superior segment left lower lobe measures 1.5 cm, image 24/3. This is compared with 1.4 cm on 10/16/2018. Small subpleural nodule in the posterior right upper lobe measures 3.4 mm, image 15/3. Unchanged. 3.8 mm nodule within the posterolateral right lower lobe is noted, image 15/3. Also unchanged. Upper Abdomen: No acute abnormality. Musculoskeletal: Spondylosis within the thoracic spine. No aggressive lytic or sclerotic bone lesions. IMPRESSION: 1. Similar appearance of superior segment left lower lobe lung nodule which exhibits low level FDG uptake on recent PET-CT. Additional tiny nodules in the right upper lobe are unchanged. 2. Aortic Atherosclerosis (ICD10-I70.0) and Emphysema (ICD10-J43.9). Electronically Signed   By: Kerby Moors M.D.   On: 11/17/2018 10:14    Labs:  CBC: Recent Labs    12/16/18 1031  WBC 6.8  HGB 13.4  HCT 40.2  PLT 171    COAGS: Recent Labs    12/16/18 1031  INR 1.1    BMP: Recent Labs    04/08/18 1147 07/20/18 0936 10/06/18 0918 12/16/18 1031  NA 140 145* 142 137  K 3.8 3.6 3.7 3.7  CL 99 102 101 103  CO2 25 26 25 27   GLUCOSE 153* 121* 115* 185*  BUN 8 12 11 12   CALCIUM 9.6 9.8 9.5 9.0  CREATININE 0.82 0.94 0.94 0.85  GFRNONAA 99 90 90 >60  GFRAA 114 104 104 >60    LIVER FUNCTION TESTS: Recent Labs     04/08/18 1147 07/20/18 0936 10/06/18 0918  BILITOT 0.3 0.5 0.5  AST 55* 39 38  ALT 77* 48* 41  ALKPHOS 115 111 109  PROT 7.1 7.0 6.9  ALBUMIN 4.3 4.6 4.5  TUMOR MARKERS: No results for input(s): AFPTM, CEA, CA199, CHROMGRNA in the last 8760 hours.  Assessment and Plan:  Kristopher Valent is 58 yo male with LLL nodule, possible carcinoma, presenting for biopsy.   He has an allergy to local anesthetics, with anaphylaxis.  We have contacted pharmacy, and they confirm there is no product that we can safely use as alternative.    He is agreeable to proceed with plan for pre-procedure ice pack.   Risks and benefits of CT guided lung nodule biopsy was discussed with the patient including, but not limited to bleeding, hemoptysis, respiratory failure requiring intubation, infection, pneumothorax requiring chest tube placement, stroke from air embolism or even death.  All of the patient's questions were answered and the patient is agreeable to proceed.  Consent signed and in chart.  .  Electronically Signed: Corrie Mckusick 12/16/2018, 12:34 PM   I spent a total of  30 Minutes   in face to face in clinical consultation, greater than 50% of which was counseling/coordinating care for LLL lung nodule, possible biopsy

## 2018-12-16 NOTE — Progress Notes (Signed)
As admitting patient today for CT Lung Biopsy with DR Earleen Newport, noting high degree of allergy to any Novacaine/Epi products. Discussed with Dr Earleen Newport along with Pharmacy, after investigation, no product available for numbing for biopsy. 2 ice packs placed to back area where biopsy to be done per Dr Pasty Arch request. After discussion with patient, he has agreed (patient) to procede with procedure with ice packs and sedation.

## 2018-12-18 ENCOUNTER — Other Ambulatory Visit: Payer: Self-pay | Admitting: Anatomic Pathology & Clinical Pathology

## 2018-12-18 LAB — SURGICAL PATHOLOGY

## 2018-12-22 ENCOUNTER — Telehealth: Payer: Self-pay

## 2018-12-23 ENCOUNTER — Ambulatory Visit (INDEPENDENT_AMBULATORY_CARE_PROVIDER_SITE_OTHER): Payer: Medicare Other | Admitting: *Deleted

## 2018-12-23 DIAGNOSIS — I129 Hypertensive chronic kidney disease with stage 1 through stage 4 chronic kidney disease, or unspecified chronic kidney disease: Secondary | ICD-10-CM | POA: Diagnosis not present

## 2018-12-23 NOTE — Patient Instructions (Signed)
Thank you allowing the Chronic Care Management Team to be a part of your care! It was a pleasure speaking with you today!  CCM (Chronic Care Management) Team   Valory Wetherby RN, BSN Nurse Care Coordinator  [phone]  [facility] PharmD  Clinical Pharmacist  [phone]  Eula Fried LCSW Clinical Social Worker [phone]  Goals Addressed            This Visit's Progress   . RNCM- I need to stay healthy for my granddaughter (pt-stated)       Current Barriers:  [name] Knowledge Deficits related to basic understanding of hypertension pathophysiology and self care management . Knowledge Deficits related to understanding of medications prescribed for management of hypertension . Patient continues to smoke 1.5 packs a day-attempting to quit . Exercise program limited by need of a new knee brace . New lung cancer diagnosis- 12/2018  Case Manager Clinical Goal(s):  [name] Over the next 90 days, patient will verbalize understanding of plan for hypertension management  Interventions:  . Evaluation of current treatment plan related to hypertension self management and patient's adherence to plan as established by provider. . Discussed plans with patient for ongoing care management follow up and provided patient with direct contact information for care management team . Reviewed scheduled/upcoming provider appointments including:  Oncology appt 10/23 to discuss cancer treatment plan . Discussed Tobacco cessation- Patient reports he is really trying to do this in lieu of his new diagnosis.  . Collaboration with CCM pharmacist and PCP, noted patient was lost to cardiologist f/u post Hip Replacement surgery, will work on obtaining a new appointment related to continued reported high b/p with headaches.- Patient would like to work on this after he finds out the plan to treat his Lung Cancer.  . Continuing to monitor b/p's, discussed with Pharm-D b/p numbers are high at times. Reports b/p 140/97  today after resting.  . Discussed with patient mood and coping through his new cancer diagnosis.   Reports overall he is coping well and hoping for the best. States he has a good support system with his daughter and girlfriend. He is also helping to care for his 22 year old granddaughter. . Empathetic listening used as patient discussed what he has been going through over the last month.  . Encouraged patient to reach out if he needed anything  Patient Self Care Activities:  . UNABLE to independently manage HTN as evidenced by reported headaches and high b/p numbers.  . Checks BP and records as discussed  Please see past updates related to this goal by clicking on the "Past Updates" button in the selected goal          The patient verbalized understanding of instructions provided today and declined a print copy of patient instruction materials.   The patient has been provided with contact information for the care management team and has been advised to call with any health related questions or concerns.

## 2018-12-23 NOTE — Chronic Care Management (AMB) (Signed)
Chronic Care Management   Follow Up Note   12/23/2018 Name: Kristopher [name] MRN: [id number] DOB: 1960/12/17  Referred by: [name], DO Reason for referral : Chronic Care Management (Hypertensive renal disease )   Kristopher [name] is a 58 y.o. year old male who is a primary care patient of [name], DO. The CCM team was consulted for assistance with chronic disease management and care coordination needs.    Review of patient status, including review of consultants reports, relevant laboratory and other test results, and collaboration with appropriate care team members and the patient's provider was performed as part of comprehensive patient evaluation and provision of chronic care management services.    SDOH (Social Determinants of Health) screening performed today: Social Connections Tobacco Use Stress. See Care Plan for related entries.   Advanced Directives Status: N See Care Plan and Vynca application for related entries.  Outpatient Encounter Medications as of 12/23/2018  Medication Sig Note  . ACCU-CHEK GUIDE test strip USE TO TEST BLOOD SUGAR 2X A DAY   . albuterol (PROVENTIL HFA;VENTOLIN HFA) 108 (90 Base) MCG/ACT inhaler Inhale 2 puffs into the lungs every 6 (six) hours as needed for wheezing or shortness of breath.   [name] atorvastatin (LIPITOR) 10 MG tablet TAKE 1 TABLET BY MOUTH EVERY DAY   . esomeprazole (NEXIUM) 40 MG capsule Take 1 capsule (40 mg total) by mouth daily. (Patient taking differently: Take 40 mg by mouth 3 times/day as needed-between meals & bedtime. ) 08/14/2018: QAM, sometimes BID if acidic supper   . Fluticasone Furoate (ARNUITY ELLIPTA) 100 MCG/ACT AEPB Inhale 1 puff into the lungs daily.   . hydrALAZINE (APRESOLINE) 100 MG tablet Take 1 tablet (100 mg total) by mouth 2 (two) times daily.   . metFORMIN (GLUCOPHAGE-XR) 500 MG 24 hr tablet Take 1 tablet (500 mg total) by mouth 2 (two) times daily with a meal.   . metoprolol succinate (TOPROL-XL)  100 MG 24 hr tablet 125 mg in the morning, and 125 mg in the evening (to be taken with the 25mg  pill to equal 125mg )   . metoprolol succinate (TOPROL-XL) 25 MG 24 hr tablet To be taken with the 100mg  for a total of 125mg  BID   . montelukast (SINGULAIR) 10 MG tablet Take 1 tablet (10 mg total) by mouth at bedtime.   . NONFORMULARY OR COMPOUNDED ITEM 10% Ketamine/2% Cyclobenzaprine/6% Gabapentin Cream Sig: 1-2 ml to affected area 3-4 times/day. Amount: 240 GM   . nortriptyline (PAMELOR) 25 MG capsule Take 2 capsules (50 mg total) by mouth at bedtime.   . Oxycodone HCl 10 MG TABS    . potassium chloride (KLOR-CON) 20 MEQ packet Take 20 mEq by mouth daily.   . SUMAtriptan (IMITREX) 100 MG tablet TAKE ONE TABLET AT ONSET OF HEADACHE MAY REPEAT IN TWO HOURS IF NECESSARY LIMIT OF TWO IN 24 HOURS 08/25/2018: Taking PRN   No facility-administered encounter medications on file as of 12/23/2018.      Goals Addressed            This Visit's Progress   . RNCM- I need to stay healthy for my granddaughter (pt-stated)       Current Barriers:  [name] Knowledge Deficits related to basic understanding of hypertension pathophysiology and self care management . Knowledge Deficits related to understanding of medications prescribed for management of hypertension . Patient continues to smoke 1.5 packs a day-attempting to quit . Exercise program limited by need of a new knee  brace . New lung cancer diagnosis- 12/2018  Case Manager Clinical Goal(s):  [name] Over the next 90 days, patient will verbalize understanding of plan for hypertension management  Interventions:  . Evaluation of current treatment plan related to hypertension self management and patient's adherence to plan as established by provider. . Discussed plans with patient for ongoing care management follow up and provided patient with direct contact information for care management team . Reviewed scheduled/upcoming provider appointments including:   Oncology appt 10/23 to discuss cancer treatment plan . Discussed Tobacco cessation- Patient reports he is really trying to do this in lieu of his new diagnosis.  . Collaboration with CCM pharmacist and PCP, noted patient was lost to cardiologist f/u post Hip Replacement surgery, will work on obtaining a new appointment related to continued reported high b/p with headaches.- Patient would like to work on this after he finds out the plan to treat his Lung Cancer.  . Continuing to monitor b/p's, discussed with Pharm-D b/p numbers are high at times. Reports b/p 140/97 today after resting.  . Discussed with patient mood and coping through his new cancer diagnosis.   Reports overall he is coping well and hoping for the best. States he has a good support system with his daughter and girlfriend. He is also helping to care for his 9 year old granddaughter. . Empathetic listening used as patient discussed what he has been going through over the last month.  . Encouraged patient to reach out if he needed anything  Patient Self Care Activities:  . UNABLE to independently manage HTN as evidenced by reported headaches and high b/p numbers.  . Checks BP and records as discussed  Please see past updates related to this goal by clicking on the "Past Updates" button in the selected goal           The care management team will reach out to the patient again over the next 30 days.  The patient has been provided with contact information for the care management team and has been advised to call with any health related questions or concerns.    [name] Chalet Kerwin RN, BSN Nurse Case Editor, commissioning [facility]  [phone]) Business Mobile

## 2018-12-24 ENCOUNTER — Other Ambulatory Visit: Payer: Medicare Other

## 2018-12-24 NOTE — Progress Notes (Signed)
Tumor Board Documentation  Kristopher [name] was presented by Duwayne Heck at our Tumor Board on 12/24/2018, which included representatives from medical oncology, radiation oncology, radiology, pathology, surgical, internal medicine, navigation, pharmacy, research, pulmonology.  Kristopher [name] currently presents as a current patient, for Kristopher [name] with history of the following treatments: active survellience, surgical intervention(s).  Additionally, we reviewed previous medical and familial history, history of present illness, and recent lab results along with all available histopathologic and imaging studies. The tumor board considered available treatment options and made the following recommendations: Surgery Patient has fu appointment with [facility] tomorrow  The following procedures/referrals were also placed: No orders of the defined types were placed in this encounter.   Clinical Trial Status: not discussed   Staging used: To be determined  AJCC Staging:       Group: Squamous Cell Carcinoma of Lung   National site-specific guidelines   were discussed with respect to the case.  Tumor board is a meeting of clinicians from various specialty areas who evaluate and discuss patients for whom a multidisciplinary approach is being considered. Final determinations in the plan of care are those of the provider(s). The responsibility for follow up of recommendations given during tumor board is that of the provider.   Today's extended care, comprehensive team conference, Kristopher [name] was not present for the discussion and was not examined.   Multidisciplinary Tumor Board is a multidisciplinary case peer review process.  Decisions discussed in the Multidisciplinary Tumor Board reflect the opinions of the specialists present at the conference without having examined the patient.  Ultimately, treatment and diagnostic decisions rest with the primary provider(s) and the patient.

## 2018-12-25 ENCOUNTER — Ambulatory Visit (INDEPENDENT_AMBULATORY_CARE_PROVIDER_SITE_OTHER): Payer: Medicare Other | Admitting: Cardiothoracic Surgery

## 2018-12-25 ENCOUNTER — Encounter: Payer: Self-pay | Admitting: Cardiothoracic Surgery

## 2018-12-25 ENCOUNTER — Other Ambulatory Visit: Payer: Self-pay

## 2018-12-25 VITALS — BP 177/93 | HR 67 | Temp 97.7°F | Resp 15 | Ht 70.0 in | Wt 231.8 lb

## 2018-12-25 DIAGNOSIS — R911 Solitary pulmonary nodule: Secondary | ICD-10-CM

## 2018-12-25 NOTE — H&P (View-Only) (Signed)
  Patient ID: Kristopher [name], male   DOB: 1960-12-05, 58 y.o.   MRN: [id number]  HISTORY: Patient comes in today without any new complaints.  He is able to cut back on his smoking from 1 pack to half a pack per day.  We encouraged him to continue his efforts at smoking cessation and offered him our smoking cessation program.  He would like to participate in that.  We will set that up for him.   Vitals:   12/25/18 0938  BP: (!) 177/93  Pulse: 67  Resp: 15  Temp: 97.7 F (36.5 C)  SpO2: 97%     EXAM:    Resp: Lungs are clear bilaterally.  No respiratory distress, normal effort. Heart:  Regular without murmurs Abd:  Abdomen is soft, non distended and non tender. No masses are palpable.  There is no rebound and no guarding.  Neurological: Alert and oriented to person, place, and time. Coordination normal.  Skin: Skin is warm and dry. No rash noted. No diaphoretic. No erythema. No pallor.  Psychiatric: Normal mood and affect. Normal behavior. Judgment and thought content normal.    ASSESSMENT: I did have a chance today to review with him the results of the biopsy.  This showed a non-small cell carcinoma of the lung favoring squamous cell.  I reviewed with his wife and the patient again the CT scans and PET scans.  His pulmonary function studies are adequate.  His FEV1 and DLCO are approximately 90%.   PLAN:   We had a long discussion about the indications and risks of surgical intervention.  We discussed the role of radiation therapy.  I explained to him that surgical intervention was the standard of care.  Risks of bleeding, infection, air leak and death were all reviewed.  We also discussed the role of smoking cessation prior to surgery.  I reviewed with him the postoperative care.  He is allergic to multiple medications including local anesthetics and penicillins.  This does somewhat limit our ability to use some of the local anesthetics for pain management.  He also understands that  our pain management service is an outpatient only service.  We will go ahead and set him up for a left thoracotomy and lung resection as soon as possible.    Nestor Lewandowsky, MD

## 2018-12-25 NOTE — Progress Notes (Signed)
  Patient ID: Kristopher [name], male   DOB: December 12, 1960, 58 y.o.   MRN: [id number]  HISTORY: Patient comes in today without any new complaints.  He is able to cut back on his smoking from 1 pack to half a pack per day.  We encouraged him to continue his efforts at smoking cessation and offered him our smoking cessation program.  He would like to participate in that.  We will set that up for him.   Vitals:   12/25/18 0938  BP: (!) 177/93  Pulse: 67  Resp: 15  Temp: 97.7 F (36.5 C)  SpO2: 97%     EXAM:    Resp: Lungs are clear bilaterally.  No respiratory distress, normal effort. Heart:  Regular without murmurs Abd:  Abdomen is soft, non distended and non tender. No masses are palpable.  There is no rebound and no guarding.  Neurological: Alert and oriented to person, place, and time. Coordination normal.  Skin: Skin is warm and dry. No rash noted. No diaphoretic. No erythema. No pallor.  Psychiatric: Normal mood and affect. Normal behavior. Judgment and thought content normal.    ASSESSMENT: I did have a chance today to review with him the results of the biopsy.  This showed a non-small cell carcinoma of the lung favoring squamous cell.  I reviewed with his wife and the patient again the CT scans and PET scans.  His pulmonary function studies are adequate.  His FEV1 and DLCO are approximately 90%.   PLAN:   We had a long discussion about the indications and risks of surgical intervention.  We discussed the role of radiation therapy.  I explained to him that surgical intervention was the standard of care.  Risks of bleeding, infection, air leak and death were all reviewed.  We also discussed the role of smoking cessation prior to surgery.  I reviewed with him the postoperative care.  He is allergic to multiple medications including local anesthetics and penicillins.  This does somewhat limit our ability to use some of the local anesthetics for pain management.  He also understands that  our pain management service is an outpatient only service.  We will go ahead and set him up for a left thoracotomy and lung resection as soon as possible.    Nestor Lewandowsky, MD

## 2018-12-25 NOTE — Patient Instructions (Addendum)
We will contact Kristopher [name] to set you up for smoking cessation program.   We have discussed taking out a lobe of your lung to remove the tumor. We have arranged this to be done on Thursday 01/14/19 by Dr. Genevive Bi at [facility].  You will have Covid-19 testing done prior to surgery, this will be on Monday 01/11/19 at the [place] drive up testing between 8-10:30 am.   You will also have an interview with Pre Admit Testing, this may be by phone or in person. They will get you cleared for Anesthesia. We will let you know about this appointment.   You will be in the hospital for 5-7 days for surgery and recovery. During that period of time, you will most likely be in the Intensive Care Unit for 1-2 days. Expect to be completely back to yourself within 3 months of the date of surgery.  Do NOT take any products containing Aspirin or Ibuprofen 5 days prior to your surgery. (Ex.- Goody Powder, Excedrin, Advil, Aleve, Ibuprofen, and Aspirin). You may take Tylenol if you need something for pain.   Please bring in any FMLA paperwork or short-term disability before your scheduled surgery and we will fill this out within 3 days of your surgery being completed.  Please refer to your [facility]) Pre-care sheet that you have been given today.  Call and ask to speak with a nurse if you have any questions or concerns regarding your surgery.    Lung Resection A lung resection is a procedure to remove part or all of a lung. When an entire lung is removed, the procedure is called a pneumonectomy. When only part of a lung is removed, the procedure is called a lobectomy. A lung resection is typically done to get rid of a tumor or cancer, but it may be done to treat other conditions. This procedure can help relieve some or all of your symptoms and can also help keep the problem from getting worse. Lung resection may provide the best chance for curing your disease. However, the procedure may not necessarily cure lung  cancer if that is the problem. Tell a health care provider about:  Any allergies you have.  All medicines you are taking, including vitamins, herbs, eye drops, creams, and over-the-counter medicines.  Any problems you or family members have had with anesthetic medicines.  Any blood disorders you have.  Any surgeries you have had.  Any medical conditions you have. What are the risks? Generally, lung resection is a safe procedure. However, problems can occur and include:  Excessive bleeding.  Infection.  Inability to breathe without a ventilator.  Persistent shortness of breath.  Heart problems, including abnormal rhythms and a risk of heart attack or heart failure.  Blood clots.  Injury to a blood vessel.  Injury to a nerve.  Failure to heal properly.  Stroke.  Bronchopleural fistula. This is a small hole between one of the main breathing tubes (bronchus) and the lining of the lungs. This is rare.  Reaction to anesthesia.  What happens before the procedure? You may have tests done before the procedure, including:  Blood tests.  Urine tests.  X-rays.  Other imaging tests (such as CT scans, MRI scans, and PET scans). These tests are done to find the exact size and location of the condition being treated with this surgery.  Pulmonary function tests. These are breathing tests to assess the function of your lungs before surgery and to decide how to best help  your breathing after surgery.  Heart testing. This is done to make sure your heart is strong enough for the procedure.  Bronchoscopy. This is a technique that allows your health care provider to look at the inside of your airways. This is done using a soft, flexible tube (bronchoscope). Along with imaging tests, this can help your health care provider know the exact location and size of the area that will be removed during surgery.  Lymph node sampling. This may need to be done to see if the tumor has spread.  It may be done as a separate surgery or right before your lung resection procedure.  What happens during the procedure?  An IV tube will be placed in your arm. You will be given a medicine that makes you fall asleep (general anesthetic). You may also get pain medicine through a thin, flexible tube (catheter) in your back.  A breathing tube will be placed in your throat.  Once the surgical team has prepared you for surgery, your surgeon will make an incision on your side. Some resections are done through large incisions, while others can be done through small incisions using smaller instruments and assisted with small cameras (laparoscopic surgery).  Your surgeon will carefully cut the veins, arteries, and bronchus leading to your lung. After being cut, each of these pieces will be sewn or stapled closed. The lung or part of the lung will then be removed.  Your surgeon will check inside your chest to make sure there is no bleeding in or around the lungs. Lymph nodes near the lung may also be removed for later tests.  Your surgeon may put tubes into your chest to drain extra fluid and air after surgery.  Your incision will be closed. This may be done using: ? Stitches that absorb into your body and do not need to be removed. ? Stitches that must be removed. ? Staples that must be removed. What happens after the procedure?  You will be taken to the recovery area and your progress will be monitored. You may still have a breathing tube and other tubes or catheters in your body immediately after surgery. These will be removed during your recovery. You may be put on a respirator following surgery if some assistance is needed to help your breathing. When you are awake and not experiencing immediate problems from surgery, you will be moved to the intensive care unit (ICU) where you will continue your recovery.  You may feel pain in your chest and throat. Sometimes during recovery, patients may shiver  or feel nauseous. You will be given medicine to help with pain and nausea.  The breathing tube will be taken out as soon as your health care providers feel you can breathe on your own. For most people, this happens on the same day as the surgery.  If your surgery and time in the ICU go well, most of the tubes and equipment will be taken out within 1-2 days after surgery. This is about how long most people stay in the ICU. You may need to stay longer, depending on how you are doing.  You should also start respiratory therapy in the ICU. This therapy uses breathing exercises to help your other lung stay healthy and get stronger.  As you improve, you will be moved to a regular hospital room for continued respiratory therapy, help with your bladder and bowels, and to continue medicines.  After your lung or part of your lung is taken out,  there will be a space inside your chest. This space will often fill up with fluid over time. The amount of time this takes is different for each person.  You will receive care until you are doing well and your health care provider feels it is safe for you to go home or to transfer to an extended care facility. This information is not intended to replace advice given to you by your health care provider. Make sure you discuss any questions you have with your health care provider. Document Released: 05/11/2002 Document Revised: 07/27/2015 Document Reviewed: 04/09/2013 Elsevier Interactive Patient Education  2018 [employer].

## 2018-12-27 ENCOUNTER — Other Ambulatory Visit: Payer: Self-pay | Admitting: Family Medicine

## 2018-12-28 ENCOUNTER — Telehealth: Payer: Self-pay | Admitting: Pain Medicine

## 2018-12-28 ENCOUNTER — Telehealth: Payer: Self-pay

## 2018-12-28 NOTE — Telephone Encounter (Signed)
Pt has been advised of pre admission date/time, Covid Testing date and Surgery date.  Surgery Date: 01/14/19 Preadmission Testing Date: 01/08/19 @  11:15 am Covid Testing Date: 01/11/19 - patient advised to go to the [place] [facility]  [employer] Video sent via [employer] [facility] and [employer].  Patient has been made aware to call [phone], between 1-3:00pm the day before surgery, to find out what time to arrive.

## 2018-12-28 NOTE — Telephone Encounter (Signed)
Informed of medication agreement, pt may receive opioids from another physician for acute situations.

## 2018-12-28 NOTE — Telephone Encounter (Signed)
Requested medication (s) are due for refill today: no  Requested medication (s) are on the active medication list: no  Last refill: 07/10/2018  Future visit scheduled: yes  Notes to clinic:  Medication was change    Requested Prescriptions  Pending Prescriptions Disp Refills   hydrALAZINE (APRESOLINE) 50 MG tablet [Pharmacy Med Name: HYDRALAZINE 50 MG TABLET] 180 tablet 1    Sig: TAKE 1 TABLET BY MOUTH TWICE A DAY     Cardiovascular:  Vasodilators Failed - 12/27/2018 10:11 AM      Failed - Last BP in normal range    BP Readings from Last 1 Encounters:  12/25/18 (!) 177/93         Passed - HCT in normal range and within 360 days    HCT  Date Value Ref Range Status  12/16/2018 40.2 39.0 - 52.0 % Final   Hematocrit  Date Value Ref Range Status  03/31/2017 45.1 37.5 - 51.0 % Final         Passed - HGB in normal range and within 360 days    Hemoglobin  Date Value Ref Range Status  12/16/2018 13.4 13.0 - 17.0 g/dL Final  03/31/2017 14.9 13.0 - 17.7 g/dL Final         Passed - RBC in normal range and within 360 days    RBC  Date Value Ref Range Status  12/16/2018 4.55 4.22 - 5.81 MIL/uL Final         Passed - WBC in normal range and within 360 days    WBC  Date Value Ref Range Status  12/16/2018 6.8 4.0 - 10.5 K/uL Final         Passed - PLT in normal range and within 360 days    Platelets  Date Value Ref Range Status  12/16/2018 171 150 - 400 K/uL Final  03/31/2017 194 150 - 379 x10E3/uL Final         Passed - Valid encounter within last 12 months    Recent Outpatient Visits          2 months ago Type 2 diabetes mellitus with stage 1 chronic kidney disease, without long-term current use of insulin ([place])   [facility] [place], [name], DO   3 months ago Benign hypertensive renal disease   [facility] [place], [name], DO   4 months ago Benign hypertensive renal disease   [facility] [place], [name], DO   5 months ago  Type 2 diabetes mellitus with stage 1 chronic kidney disease, without long-term current use of insulin ([place])   [facility] [place], [name], DO   8 months ago COPD exacerbation ([place])   [facility] [place], [place], DO      Future Appointments            In 3 months Johnson, [name], DO [facility], [name]           Signed Prescriptions Disp Refills   metFORMIN (GLUCOPHAGE-XR) 500 MG 24 hr tablet 360 tablet 0    Sig: TAKE 2 TABLETS (1,000 MG TOTAL) BY MOUTH 2 (TWO) TIMES DAILY WITH A MEAL.     Endocrinology:  Diabetes - Biguanides Passed - 12/27/2018 10:11 AM      Passed - Cr in normal range and within 360 days    Creatinine, Ser  Date Value Ref Range Status  12/16/2018 0.85 0.61 - 1.24 mg/dL Final  Passed - HBA1C is between 0 and 7.9 and within 180 days    HB A1C (BAYER DCA - WAIVED)  Date Value Ref Range Status  10/06/2018 5.8 <7.0 % Final    Comment:                                          Diabetic Adult            <7.0                                       Healthy Adult        4.3 - 5.7                                                           (DCCT/NGSP) American Diabetes Association's Summary of Glycemic Recommendations for Adults with Diabetes: Hemoglobin A1c <7.0%. More stringent glycemic goals (A1c <6.0%) may further reduce complications at the cost of increased risk of hypoglycemia.          Passed - eGFR in normal range and within 360 days    GFR calc Af Amer  Date Value Ref Range Status  12/16/2018 >60 >60 mL/min Final   GFR calc non Af Amer  Date Value Ref Range Status  12/16/2018 >60 >60 mL/min Final         Passed - Valid encounter within last 6 months    Recent Outpatient Visits          2 months ago Type 2 diabetes mellitus with stage 1 chronic kidney disease, without long-term current use of insulin ([place])   [place], [name], DO   3 months ago Benign hypertensive renal disease    [facility] [place], [name], DO   4 months ago Benign hypertensive renal disease   [facility] [place], [name], DO   5 months ago Type 2 diabetes mellitus with stage 1 chronic kidney disease, without long-term current use of insulin ([place])   [place], [name], DO   8 months ago COPD exacerbation [facility])   [place], [place], DO      Future Appointments            In 3 months Johnson, Barb Merino, DO [employer], [name]

## 2018-12-28 NOTE — Telephone Encounter (Signed)
Patient lvmail stating he si scheduled for surgery on Nov 12 to remove cancer on his lung. He will be in hospital for 1-2 weeks. Would like to speak with Nurse about this. Please call.

## 2018-12-29 ENCOUNTER — Encounter: Payer: Self-pay | Admitting: *Deleted

## 2019-01-01 ENCOUNTER — Ambulatory Visit: Payer: Self-pay | Admitting: Pharmacist

## 2019-01-01 ENCOUNTER — Telehealth: Payer: Self-pay | Admitting: Family Medicine

## 2019-01-01 ENCOUNTER — Other Ambulatory Visit: Payer: Self-pay

## 2019-01-01 ENCOUNTER — Ambulatory Visit (INDEPENDENT_AMBULATORY_CARE_PROVIDER_SITE_OTHER): Payer: Medicare Other | Admitting: Family Medicine

## 2019-01-01 ENCOUNTER — Encounter: Payer: Self-pay | Admitting: Family Medicine

## 2019-01-01 ENCOUNTER — Telehealth: Payer: Medicare Other | Admitting: Family Medicine

## 2019-01-01 DIAGNOSIS — Z720 Tobacco use: Secondary | ICD-10-CM

## 2019-01-01 DIAGNOSIS — F17210 Nicotine dependence, cigarettes, uncomplicated: Secondary | ICD-10-CM

## 2019-01-01 MED ORDER — NICOTINE 21 MG/24HR TD PT24: 21.0000 mg | MEDICATED_PATCH | Freq: Every day | TRANSDERMAL | 1 refills | Status: DC

## 2019-01-01 MED ORDER — NICOTINE POLACRILEX 2 MG MT GUM: 2.0000 mg | CHEWING_GUM | OROMUCOSAL | 0 refills | Status: DC | PRN

## 2019-01-01 NOTE — Chronic Care Management (AMB) (Signed)
Chronic Care Management   Follow Up Note   01/01/2019 Name: SKYELER SMOLA MRN: [id number] DOB: August 18, 1960  Referred by: [name], DO Reason for referral : Chronic Care Management (Medication Management)   TYSON PARKISON is a 58 y.o. year old male who is a primary care patient of [name], DO. The CCM team was consulted for assistance with chronic disease management and care coordination needs.   Contacted patient in response to a call to [place] Minor, RN CM, regarding cost of NRT.   Review of patient status, including review of consultants reports, relevant laboratory and other test results, and collaboration with appropriate care team members and the patient's provider was performed as part of comprehensive patient evaluation and provision of chronic care management services.    SDOH (Social Determinants of Health) screening performed today: Tobacco Use. See Care Plan for related entries.   Advanced Directives Status: N See Care Plan and [facility]yn[employer] application for related entries.  Outpatient Encounter Medications as of 01/01/2019  Medication Sig Note  . ACCU-CHEK GUIDE test strip USE TO TEST BLOOD SUGAR 2X A DAY   . albuterol (PROVENTIL HFA;VENTOLIN HFA) 108 (90 Base) MCG/ACT inhaler Inhale 2 puffs into the lungs every 6 (six) hours as needed for wheezing or shortness of breath.   [name] atorvastatin (LIPITOR) 10 MG tablet TAKE 1 TABLET BY MOUTH EVERY DAY   . esomeprazole (NEXIUM) 40 MG capsule Take 1 capsule (40 mg total) by mouth daily. (Patient taking differently: Take 40 mg by mouth 3 times/day as needed-between meals & bedtime. ) 08/14/2018: QAM, sometimes BID if acidic supper   . Fluticasone Furoate (ARNUITY ELLIPTA) 100 MCG/ACT AEPB Inhale 1 puff into the lungs daily.   . hydrALAZINE (APRESOLINE) 100 MG tablet Take 1 tablet (100 mg total) by mouth 2 (two) times daily.   . metFORMIN (GLUCOPHAGE-XR) 500 MG 24 hr tablet TAKE 2 TABLETS (1,000 MG TOTAL) BY MOUTH 2 (TWO)  TIMES DAILY WITH A MEAL.   . metoprolol succinate (TOPROL-XL) 100 MG 24 hr tablet 125 mg in the morning, and 125 mg in the evening (to be taken with the 25mg  pill to equal 125mg )   . metoprolol succinate (TOPROL-XL) 25 MG 24 hr tablet To be taken with the 100mg  for a total of 125mg  BID   . montelukast (SINGULAIR) 10 MG tablet Take 1 tablet (10 mg total) by mouth at bedtime.   . NONFORMULARY OR COMPOUNDED ITEM 10% Ketamine/2% Cyclobenzaprine/6% Gabapentin Cream Sig: 1-2 ml to affected area 3-4 times/day. Amount: 240 GM   . nortriptyline (PAMELOR) 25 MG capsule Take 2 capsules (50 mg total) by mouth at bedtime.   . Oxycodone HCl 10 MG TABS Take 10 mg by mouth 4 (four) times daily.    . potassium chloride (KLOR-CON) 20 MEQ packet Take 20 mEq by mouth daily.   . SUMAtriptan (IMITREX) 100 MG tablet TAKE ONE TABLET AT ONSET OF HEADACHE MAY REPEAT IN TWO HOURS IF NECESSARY LIMIT OF TWO IN 24 HOURS 08/25/2018: Taking PRN   No facility-administered encounter medications on file as of 01/01/2019.      Goals Addressed            This Visit's Progress     Patient Stated   . PharmD "I need to quit smoking" (pt-stated)       Current Barriers:  . Hx tobacco abuse, recent diagnosis of lung cancer w/ upcoming surgery; currently smoking 1/2 ppd; previously about 1 ppd, but noted that he  needs to quit smoking before surgery . Previous quit attempts, unsuccessful w/ Chantix - notes extreme aggravation resulting in "going to the hospital because I punched a wall" . Denies smoking within 30 minutes of waking up . Reports motivation to quit smoking includes: his health, his family's health  Pharmacist Clinical Goal(s):  [name] Over the next 90 days, patient will work with PharmD and provider towards tobacco cessation  Interventions: . Recommend NRT patches + gum/lozenges. Recommend 21 mg patch and 2 mg gum/based on current tobacco use.  . Counseled on patch placement, side effects, and option to remove at  night if they experience trouble sleeping or bad dreams.  . Counseled on park & chew method for NRT gum. . Will collaborate w/ Merrie Roof on above prescriptions  Patient Self Care Activities: . Patient will commit to reducing tobacco consumption  Initial goal documentation        Plan:  - Patient receiving counseling and close outreach from the cancer center for impending surgery. Will plan to f/u in December for medication management support  Catie Darnelle Maffucci, PharmD Clinical Pharmacist [place] [phone]

## 2019-01-01 NOTE — Patient Instructions (Signed)
Visit Information  Goals Addressed            This Visit's Progress     Patient Stated   . PharmD "I need to quit smoking" (pt-stated)       Current Barriers:  . Hx tobacco abuse, recent diagnosis of lung cancer w/ upcoming surgery; currently smoking 1/2 ppd; previously about 1 ppd, but noted that he needs to quit smoking before surgery . Previous quit attempts, unsuccessful w/ Chantix - notes extreme aggravation resulting in "going to the hospital because I punched a wall" . Denies smoking within 30 minutes of waking up . Reports motivation to quit smoking includes: his health, his family's health  Pharmacist Clinical Goal(s):  [name] Over the next 90 days, patient will work with PharmD and provider towards tobacco cessation  Interventions: . Recommend NRT patches + gum/lozenges. Recommend 21 mg patch and 2 mg gum/based on current tobacco use.  . Counseled on patch placement, side effects, and option to remove at night if they experience trouble sleeping or bad dreams.  . Counseled on park & chew method for NRT gum. . Will collaborate w/ Merrie Roof on above prescriptions  Patient Self Care Activities: . Patient will commit to reducing tobacco consumption  Initial goal documentation        The patient verbalized understanding of instructions provided today and declined a print copy of patient instruction materials.   Plan:  - Patient receiving counseling and close outreach from the cancer center for impending surgery. Will plan to f/u in December for medication management support  Catie Darnelle Maffucci, PharmD Clinical Pharmacist [place] [phone]

## 2019-01-01 NOTE — Telephone Encounter (Signed)
error 

## 2019-01-01 NOTE — Progress Notes (Signed)
There were no vitals taken for this visit.   Subjective:    Patient ID: Kristopher [name], male    DOB: August 07, 1960, 58 y.o.   MRN: [id number]  HPI: Kristopher [name] is a 58 y.o. male  Chief Complaint  Patient presents with  . Nicotine Dependence    pt states he needs to start something to help him stop smoking, patches, gum, or lozenges, before surgery    . This visit was completed via telephone due to the restrictions of the COVID-19 pandemic. All issues as above were discussed and addressed. Physical exam was done as above through visual confirmation on telephone. If it was felt that the patient should be evaluated in the office, they were directed there. The patient verbally consented to this visit. . Location of the patient: home . Location of the provider: home . Those involved with this call:  . Provider: Merrie Roof, PA-C . CMA: Tiffany Reel, CMA . Front Desk/Registration: Jill Side  . Time spent on call: 15 minutes on the phone discussing health concerns. 5 minutes total spent in review of patient's record and preparation of their chart. I verified patient identity using two factors (patient name and date of birth). Patient consents verbally to being seen via telemedicine visit today.   Patient presenting today to discuss smoking cessation. Is scheduled for left thoracotomy and lung resection for newly diagnosed lung cancer in 2 weeks and was told he must start smoking cessation therapy immediately in order to avoid delays in this procedure. Very motivated to quit. Has tried quitting once before and took chantix and had severe nightmares and ended up becoming aggressive on it. Going through smoking cessation counseling through cancer center and spoke with CCM Pharmacist earlier today.   Relevant past medical, surgical, family and social history reviewed and updated as indicated. Interim medical history since our last visit reviewed. Allergies and medications reviewed and  updated.  Review of Systems  Per HPI unless specifically indicated above     Objective:    There were no vitals taken for this visit.  Wt Readings from Last 3 Encounters:  12/25/18 231 lb 12.8 oz (105.1 kg)  12/16/18 221 lb (100.2 kg)  12/03/18 228 lb 3.2 oz (103.5 kg)    Physical Exam  Unable to perform PE today due to technical difficulties with video technology for today's visit  Results for orders placed or performed during the hospital encounter of 37/62/83  Basic metabolic panel  Result Value Ref Range   Sodium 137 135 - 145 mmol/L   Potassium 3.7 3.5 - 5.1 mmol/L   Chloride 103 98 - 111 mmol/L   CO2 27 22 - 32 mmol/L   Glucose, Bld 185 (H) 70 - 99 mg/dL   BUN 12 6 - 20 mg/dL   Creatinine, Ser 0.85 0.61 - 1.24 mg/dL   Calcium 9.0 8.9 - 10.3 mg/dL   GFR calc non Af Amer >60 >60 mL/min   GFR calc Af Amer >60 >60 mL/min   Anion gap 7 5 - 15  CBC with Differential/Platelet  Result Value Ref Range   WBC 6.8 4.0 - 10.5 K/uL   RBC 4.55 4.22 - 5.81 MIL/uL   Hemoglobin 13.4 13.0 - 17.0 g/dL   HCT 40.2 39.0 - 52.0 %   MCV 88.4 80.0 - 100.0 fL   MCH 29.5 26.0 - 34.0 pg   MCHC 33.3 30.0 - 36.0 g/dL   RDW 13.1 11.5 - 15.5 %   Platelets  171 150 - 400 K/uL   nRBC 0.0 0.0 - 0.2 %   Neutrophils Relative % 52 %   Neutro Abs 3.5 1.7 - 7.7 K/uL   Lymphocytes Relative 37 %   Lymphs Abs 2.5 0.7 - 4.0 K/uL   Monocytes Relative 7 %   Monocytes Absolute 0.5 0.1 - 1.0 K/uL   Eosinophils Relative 2 %   Eosinophils Absolute 0.1 0.0 - 0.5 K/uL   Basophils Relative 1 %   Basophils Absolute 0.1 0.0 - 0.1 K/uL   Immature Granulocytes 1 %   Abs Immature Granulocytes 0.04 0.00 - 0.07 K/uL  Protime-INR  Result Value Ref Range   Prothrombin Time 14.4 11.4 - 15.2 seconds   INR 1.1 0.8 - 1.2  Glucose, capillary  Result Value Ref Range   Glucose-Capillary 170 (H) 70 - 99 mg/dL  Surgical pathology  Result Value Ref Range   SURGICAL PATHOLOGY      SURGICAL PATHOLOGY CASE:  [id number] PATIENT: Terrilyn Saver Surgical Pathology Report     Specimen Submitted: A. Lung, left  Clinical History: None provided    DIAGNOSIS: A. LUNG, LEFT LOWER LOBE; CT-GUIDED BIOPSY: - POSITIVE FOR MALIGNANCY. - NON-SMALL CELL CARCINOMA, FAVOR SQUAMOUS CELL CARCINOMA.  Comment: Immunohistochemical studies with appropriately reactive controls show tumor cells to be positive for p40, and negative for CK7 and TTF-1.  There is limited tissue for additional ancillary testing.  IHC slides were prepared by [facility], [place], [place]. All controls stained appropriately.  This test was developed and its performance characteristics determined by [facility]. It has not been cleared or approved by the [place] Food and Drug Administration. The FDA does not require this test to go through premarket FDA review. This test is used for clinical purposes. It should not be regarded as investigational or for resear ch. This laboratory is certified under the Clinical Laboratory Improvement Amendments (CLIA) as qualified to perform high complexity clinical laboratory testing.  GROSS DESCRIPTION: A. Labeled: Left lung biopsy Received: In formalin Tissue fragment(s): 4 Size: 0.5 x 0.4 x 0.1 cm in aggregate Description: White soft tissue fragments Entirely submitted in 1 cassette.   Final Diagnosis performed by Allena Napoleon, MD.   Electronically signed 12/18/2018 2:30:23PM The electronic signature indicates that the named Attending Pathologist has evaluated the specimen Technical component performed at [facility], [address], [place], [place] [address] Lab: [phone] Dir: Rush Farmer, MD, MMM  Professional component performed at [facility], [facility], [place], [place], [place] [address] Lab: [phone] Dir: Dellia Nims. Reuel Derby, MD       Assessment & Plan:   Problem List Items Addressed This Visit    None    Visit  Diagnoses    Cigarette smoker    -  Primary   Motivated to quit. Will start nicotine patches and gums, continue counseling sessions, work on habit replacement and routine changes for support       Follow up plan: Return in about 4 weeks (around 01/29/2019) for smoking cessation f/u.

## 2019-01-04 ENCOUNTER — Ambulatory Visit: Payer: Self-pay | Admitting: Nurse Practitioner

## 2019-01-08 ENCOUNTER — Other Ambulatory Visit: Payer: Self-pay

## 2019-01-08 ENCOUNTER — Telehealth: Payer: Self-pay

## 2019-01-08 ENCOUNTER — Other Ambulatory Visit: Payer: Self-pay | Admitting: Family Medicine

## 2019-01-08 ENCOUNTER — Encounter
Admission: RE | Admit: 2019-01-08 | Discharge: 2019-01-08 | Disposition: A | Discharge status: Home / Self Care | Payer: Medicare Other | Source: Ambulatory Visit | Attending: Cardiothoracic Surgery | Admitting: Cardiothoracic Surgery

## 2019-01-08 DIAGNOSIS — Z01812 Encounter for preprocedural laboratory examination: Secondary | ICD-10-CM | POA: Diagnosis not present

## 2019-01-08 LAB — CBC WITH DIFFERENTIAL/PLATELET
Abs Immature Granulocytes: 0.05 10*3/uL (ref 0.00–0.07)
Basophils Absolute: 0.1 10*3/uL (ref 0.0–0.1)
Basophils Relative: 1 %
Eosinophils Absolute: 0.1 10*3/uL (ref 0.0–0.5)
Eosinophils Relative: 1 %
HCT: 39.3 % (ref 39.0–52.0)
Hemoglobin: 13.5 g/dL (ref 13.0–17.0)
Immature Granulocytes: 1 %
Lymphocytes Relative: 32 %
Lymphs Abs: 2.5 10*3/uL (ref 0.7–4.0)
MCH: 29.5 pg (ref 26.0–34.0)
MCHC: 34.4 g/dL (ref 30.0–36.0)
MCV: 85.8 fL (ref 80.0–100.0)
Monocytes Absolute: 0.6 10*3/uL (ref 0.1–1.0)
Monocytes Relative: 8 %
Neutro Abs: 4.5 10*3/uL (ref 1.7–7.7)
Neutrophils Relative %: 57 %
Platelets: 177 10*3/uL (ref 150–400)
RBC: 4.58 MIL/uL (ref 4.22–5.81)
RDW: 13 % (ref 11.5–15.5)
WBC: 7.9 10*3/uL (ref 4.0–10.5)
nRBC: 0 % (ref 0.0–0.2)

## 2019-01-08 LAB — COMPREHENSIVE METABOLIC PANEL
ALT: 68 U/L — ABNORMAL HIGH (ref 0–44)
AST: 51 U/L — ABNORMAL HIGH (ref 15–41)
Albumin: 4.4 g/dL (ref 3.5–5.0)
Alkaline Phosphatase: 102 U/L (ref 38–126)
Anion gap: 10 (ref 5–15)
BUN: 13 mg/dL (ref 6–20)
CO2: 25 mmol/L (ref 22–32)
Calcium: 9.1 mg/dL (ref 8.9–10.3)
Chloride: 100 mmol/L (ref 98–111)
Creatinine, Ser: 0.86 mg/dL (ref 0.61–1.24)
GFR calc Af Amer: >60 mL/min (ref >60)
GFR calc non Af Amer: >60 mL/min (ref >60)
Glucose, Bld: 243 mg/dL — ABNORMAL HIGH (ref 70–99)
Potassium: 3.4 mmol/L — ABNORMAL LOW (ref 3.5–5.1)
Sodium: 135 mmol/L (ref 135–145)
Total Bilirubin: 0.6 mg/dL (ref 0.3–1.2)
Total Protein: 7.6 g/dL (ref 6.5–8.1)

## 2019-01-08 LAB — APTT: aPTT: 30 seconds (ref 24–36)

## 2019-01-08 LAB — PROTIME-INR
INR: 1.1 (ref 0.8–1.2)
Prothrombin Time: 13.9 seconds (ref 11.4–15.2)

## 2019-01-08 NOTE — Telephone Encounter (Signed)
Received lab work from pre admit testing. Patient's potassium is low at 3.4. I spoke with him and he is taking Potassium 20 meq once daily. I instructed him to start taking this twice a day until he has surgery. He is aware and will start doing this.

## 2019-01-08 NOTE — Patient Instructions (Signed)
Your procedure is scheduled on: Thursday 01/14/19.  Report to DAY SURGERY DEPARTMENT LOCATED ON 2ND FLOOR MEDICAL MALL ENTRANCE. To find out your arrival time please call [phone] between 1PM - 3PM on Wednesday 01/13/19.    Remember: Instructions that are not followed completely may result in serious medical risk, up to and including death, or upon the discretion of your surgeon and anesthesiologist your surgery may need to be rescheduled.      _X__ 1. Do not eat food after midnight the night before your procedure.                 No gum chewing or hard candies. You may drink clear liquids up to 2 hours                 before you are scheduled to arrive for your surgery- DO NOT drink clear                 liquids within 2 hours of the start of your surgery.                 Clear Liquids include:  water, apple juice without pulp, clear carbohydrate                 drink such as Clearfast or Gatorade, Black Coffee or Tea (Do not add                 milk or creamer to coffee or tea).    __X__2.  On the morning of surgery brush your teeth with toothpaste and water, you may rinse your mouth with mouthwash if you wish.  Do not swallow any toothpaste or mouthwash.       _X__ 3.  No Alcohol for 24 hours before or after surgery.     _X__ 4.  Do Not Smoke or use e-cigarettes For 24 Hours Prior to Your Surgery.                 Do not use any chewable tobacco products for at least 6 hours prior to                 Surgery.   __X__5.  Notify your doctor if there is any change in your medical condition      (cold, fever, infections).       Do not wear jewelry, make-up, hairpins, clips or nail polish. Do not wear lotions, powders, or perfumes.  Do not shave 48 hours prior to surgery. Men may shave face and neck. Do not bring valuables to the hospital.      [facility] is not responsible for any belongings or valuables.    Contacts, dentures/partials or body piercings may not be  worn into surgery. Bring a case for your contacts, glasses or hearing aids, a denture cup will be supplied.     Please read over the following fact sheets that you were given:   MRSA Information    __X__ Take these medicines the morning of surgery with A SIP OF WATER:     1. albuterol (PROVENTIL HFA;VENTOLIN HFA) 108 (90 Base) MCG/ACT inhaler  2. esomeprazole (NEXIUM) 40 MG capsule  3. Fluticasone Furoate (ARNUITY ELLIPTA) 100 MCG/ACT AEPB  4. hydrALAZINE (APRESOLINE) 100 MG tablet  5. metoprolol succinate (TOPROL-XL) 100 MG 24 hr tablet  6. Oxycodone HCl 10 MG TABS     __X__ Use CHG Soap as directed    _ X___ Use inhalers on the  day of surgery. Also bring the inhaler with you to the hospital on the morning of surgery.    __X__ Stop Metformin 2 days prior to surgery. Your last dose will be on Monday 01/11/19.     __X__ Stop Anti-inflammatories 7 days before surgery such as Advil, Ibuprofen, Motrin, BC or Goodies Powder, Naprosyn, Naproxen, Aleve, Aspirin, Meloxicam. May take Tylenol or Oxycodone if needed for pain or discomfort.     __X__ Don't start taking any new herbal supplements before your procedure.

## 2019-01-08 NOTE — Telephone Encounter (Signed)
Requested medication (s) are due for refill today: no  Requested medication (s) are on the active medication list: yes  Last refill:  10/14/2017  Future visit scheduled: yes  Notes to clinic: last filled by historical provider  Review for refill   Requested Prescriptions  Pending Prescriptions Disp Refills   potassium chloride (KLOR-CON) 20 MEQ packet       Sig: Take 20 mEq by mouth daily.     Endocrinology:  Minerals - Potassium Supplementation Failed - 01/08/2019  3:03 PM      Failed - K in normal range and within 360 days    Potassium  Date Value Ref Range Status  01/08/2019 3.4 (L) 3.5 - 5.1 mmol/L Final         Passed - Cr in normal range and within 360 days    Creatinine, Ser  Date Value Ref Range Status  01/08/2019 0.86 0.61 - 1.24 mg/dL Final         Passed - Valid encounter within last 12 months    Recent Outpatient Visits          1 week ago Cigarette smoker   [place], [place], [place]   3 months ago Type 2 diabetes mellitus with stage 1 chronic kidney disease, without long-term current use of insulin ([place])   [place], [name], DO   3 months ago Benign hypertensive renal disease   [facility] [place], [name], DO   4 months ago Benign hypertensive renal disease   [facility] [place], [name], DO   5 months ago Type 2 diabetes mellitus with stage 1 chronic kidney disease, without long-term current use of insulin ([place])   [place], Allen, DO      Future Appointments            In 3 months Johnson, Barb Merino, DO [name]

## 2019-01-08 NOTE — Pre-Procedure Instructions (Signed)
Pre-Admit Testing Provider Notification Note  Provider Notified: Dr. Genevive Bi  Notification Mode: Fax  Reason: CMP Results  Response: Fax confirmation received.   Additional Information: Placed on chart. Noted on Pre-Admit Worksheet.  Signed: Beulah Gandy, RN

## 2019-01-08 NOTE — Telephone Encounter (Signed)
Medication Refill: potassium chloride (KLOR-CON) 20 MEQ packet [[id number]]    Pharmacy:  [facility], [place] [phone] (Phone) [phone] (Fax)    Pt aware of turn around time

## 2019-01-11 ENCOUNTER — Other Ambulatory Visit: Payer: Self-pay

## 2019-01-11 ENCOUNTER — Other Ambulatory Visit
Admission: RE | Admit: 2019-01-11 | Discharge: 2019-01-11 | Disposition: A | Discharge status: Home / Self Care | Payer: Medicare Other | Source: Ambulatory Visit | Attending: Cardiothoracic Surgery | Admitting: Cardiothoracic Surgery

## 2019-01-11 ENCOUNTER — Telehealth: Payer: Self-pay

## 2019-01-11 DIAGNOSIS — I129 Hypertensive chronic kidney disease with stage 1 through stage 4 chronic kidney disease, or unspecified chronic kidney disease: Secondary | ICD-10-CM | POA: Diagnosis not present

## 2019-01-11 DIAGNOSIS — E1122 Type 2 diabetes mellitus with diabetic chronic kidney disease: Secondary | ICD-10-CM | POA: Diagnosis not present

## 2019-01-11 DIAGNOSIS — Z4682 Encounter for fitting and adjustment of non-vascular catheter: Secondary | ICD-10-CM | POA: Diagnosis not present

## 2019-01-11 DIAGNOSIS — J439 Emphysema, unspecified: Secondary | ICD-10-CM | POA: Diagnosis not present

## 2019-01-11 DIAGNOSIS — R0602 Shortness of breath: Secondary | ICD-10-CM | POA: Diagnosis not present

## 2019-01-11 DIAGNOSIS — K219 Gastro-esophageal reflux disease without esophagitis: Secondary | ICD-10-CM | POA: Diagnosis not present

## 2019-01-11 DIAGNOSIS — J939 Pneumothorax, unspecified: Secondary | ICD-10-CM | POA: Diagnosis not present

## 2019-01-11 DIAGNOSIS — J811 Chronic pulmonary edema: Secondary | ICD-10-CM | POA: Diagnosis not present

## 2019-01-11 DIAGNOSIS — M5442 Lumbago with sciatica, left side: Secondary | ICD-10-CM | POA: Diagnosis not present

## 2019-01-11 DIAGNOSIS — E785 Hyperlipidemia, unspecified: Secondary | ICD-10-CM | POA: Diagnosis not present

## 2019-01-11 DIAGNOSIS — N181 Chronic kidney disease, stage 1: Secondary | ICD-10-CM | POA: Diagnosis not present

## 2019-01-11 DIAGNOSIS — Z833 Family history of diabetes mellitus: Secondary | ICD-10-CM | POA: Diagnosis not present

## 2019-01-11 DIAGNOSIS — Z20828 Contact with and (suspected) exposure to other viral communicable diseases: Secondary | ICD-10-CM | POA: Insufficient documentation

## 2019-01-11 DIAGNOSIS — D72828 Other elevated white blood cell count: Secondary | ICD-10-CM | POA: Diagnosis not present

## 2019-01-11 DIAGNOSIS — M79601 Pain in right arm: Secondary | ICD-10-CM | POA: Diagnosis not present

## 2019-01-11 DIAGNOSIS — M5412 Radiculopathy, cervical region: Secondary | ICD-10-CM | POA: Diagnosis not present

## 2019-01-11 DIAGNOSIS — K567 Ileus, unspecified: Secondary | ICD-10-CM | POA: Diagnosis not present

## 2019-01-11 DIAGNOSIS — I4891 Unspecified atrial fibrillation: Secondary | ICD-10-CM | POA: Diagnosis not present

## 2019-01-11 DIAGNOSIS — C3432 Malignant neoplasm of lower lobe, left bronchus or lung: Secondary | ICD-10-CM | POA: Diagnosis not present

## 2019-01-11 DIAGNOSIS — M79605 Pain in left leg: Secondary | ICD-10-CM | POA: Diagnosis not present

## 2019-01-11 DIAGNOSIS — J9600 Acute respiratory failure, unspecified whether with hypoxia or hypercapnia: Secondary | ICD-10-CM | POA: Diagnosis not present

## 2019-01-11 DIAGNOSIS — J9601 Acute respiratory failure with hypoxia: Secondary | ICD-10-CM | POA: Diagnosis not present

## 2019-01-11 DIAGNOSIS — Z9689 Presence of other specified functional implants: Secondary | ICD-10-CM | POA: Diagnosis not present

## 2019-01-11 DIAGNOSIS — Z7951 Long term (current) use of inhaled steroids: Secondary | ICD-10-CM | POA: Diagnosis not present

## 2019-01-11 DIAGNOSIS — Z01812 Encounter for preprocedural laboratory examination: Secondary | ICD-10-CM | POA: Insufficient documentation

## 2019-01-11 DIAGNOSIS — T40605A Adverse effect of unspecified narcotics, initial encounter: Secondary | ICD-10-CM | POA: Diagnosis not present

## 2019-01-11 DIAGNOSIS — G894 Chronic pain syndrome: Secondary | ICD-10-CM | POA: Diagnosis not present

## 2019-01-11 DIAGNOSIS — I480 Paroxysmal atrial fibrillation: Secondary | ICD-10-CM | POA: Diagnosis not present

## 2019-01-11 DIAGNOSIS — E119 Type 2 diabetes mellitus without complications: Secondary | ICD-10-CM | POA: Diagnosis not present

## 2019-01-11 DIAGNOSIS — Z79899 Other long term (current) drug therapy: Secondary | ICD-10-CM | POA: Diagnosis not present

## 2019-01-11 DIAGNOSIS — Z7984 Long term (current) use of oral hypoglycemic drugs: Secondary | ICD-10-CM | POA: Diagnosis not present

## 2019-01-11 DIAGNOSIS — Z96642 Presence of left artificial hip joint: Secondary | ICD-10-CM | POA: Diagnosis not present

## 2019-01-11 DIAGNOSIS — R918 Other nonspecific abnormal finding of lung field: Secondary | ICD-10-CM | POA: Diagnosis not present

## 2019-01-11 DIAGNOSIS — R140 Abdominal distension (gaseous): Secondary | ICD-10-CM | POA: Diagnosis not present

## 2019-01-11 DIAGNOSIS — Z9049 Acquired absence of other specified parts of digestive tract: Secondary | ICD-10-CM | POA: Diagnosis not present

## 2019-01-11 DIAGNOSIS — C3492 Malignant neoplasm of unspecified part of left bronchus or lung: Secondary | ICD-10-CM | POA: Diagnosis not present

## 2019-01-11 DIAGNOSIS — I10 Essential (primary) hypertension: Secondary | ICD-10-CM | POA: Diagnosis not present

## 2019-01-11 DIAGNOSIS — R911 Solitary pulmonary nodule: Secondary | ICD-10-CM | POA: Diagnosis not present

## 2019-01-11 LAB — SARS CORONAVIRUS 2 (TAT 6-24 HRS): SARS Coronavirus 2: NEGATIVE

## 2019-01-11 MED ORDER — POTASSIUM CHLORIDE 20 MEQ PO PACK: 20.0000 meq | PACK | Freq: Every day | ORAL | 1 refills | Status: DC

## 2019-01-11 MED ORDER — POTASSIUM CHLORIDE ER 20 MEQ PO TBCR: 20.0000 meq | EXTENDED_RELEASE_TABLET | Freq: Every day | ORAL | 1 refills | Status: DC

## 2019-01-11 NOTE — Telephone Encounter (Signed)
Routing to provider  

## 2019-01-11 NOTE — Telephone Encounter (Signed)
[facility] pharmacist Anderson Malta called and spoke to her. She stated that they received a Rx for potassium chloride packet but insurance will not cover this. Pharmacist stated that if it can be switched for tablets instead of packets. Please advise.

## 2019-01-13 ENCOUNTER — Encounter: Payer: Self-pay | Admitting: Certified Registered Nurse Anesthetist

## 2019-01-13 MED ORDER — VANCOMYCIN HCL IN DEXTROSE 1-5 GM/200ML-% IV SOLN
1000.0000 mg | INTRAVENOUS | Status: AC
  Administered 2019-01-14: 1000 mg via INTRAVENOUS

## 2019-01-14 ENCOUNTER — Inpatient Hospital Stay: Payer: Medicare Other | Admitting: Registered Nurse

## 2019-01-14 ENCOUNTER — Inpatient Hospital Stay
Admission: RE | Admit: 2019-01-14 | Discharge: 2019-01-25 | DRG: 163 | Disposition: A | Discharge status: Home Health | Payer: Medicare Other | Attending: Cardiothoracic Surgery | Admitting: Cardiothoracic Surgery

## 2019-01-14 ENCOUNTER — Other Ambulatory Visit: Payer: Self-pay

## 2019-01-14 ENCOUNTER — Inpatient Hospital Stay: Payer: Medicare Other

## 2019-01-14 ENCOUNTER — Encounter
Admission: RE | Disposition: A | Discharge status: Home Health | Payer: Self-pay | Source: Home / Self Care | Attending: Cardiothoracic Surgery

## 2019-01-14 DIAGNOSIS — M5412 Radiculopathy, cervical region: Secondary | ICD-10-CM | POA: Diagnosis not present

## 2019-01-14 DIAGNOSIS — K219 Gastro-esophageal reflux disease without esophagitis: Secondary | ICD-10-CM | POA: Diagnosis present

## 2019-01-14 DIAGNOSIS — D72828 Other elevated white blood cell count: Secondary | ICD-10-CM | POA: Diagnosis not present

## 2019-01-14 DIAGNOSIS — Z9689 Presence of other specified functional implants: Secondary | ICD-10-CM | POA: Diagnosis not present

## 2019-01-14 DIAGNOSIS — I10 Essential (primary) hypertension: Secondary | ICD-10-CM | POA: Diagnosis present

## 2019-01-14 DIAGNOSIS — E669 Obesity, unspecified: Secondary | ICD-10-CM | POA: Diagnosis present

## 2019-01-14 DIAGNOSIS — E119 Type 2 diabetes mellitus without complications: Secondary | ICD-10-CM | POA: Diagnosis present

## 2019-01-14 DIAGNOSIS — Z79899 Other long term (current) drug therapy: Secondary | ICD-10-CM | POA: Diagnosis not present

## 2019-01-14 DIAGNOSIS — E785 Hyperlipidemia, unspecified: Secondary | ICD-10-CM | POA: Diagnosis present

## 2019-01-14 DIAGNOSIS — J9601 Acute respiratory failure with hypoxia: Secondary | ICD-10-CM

## 2019-01-14 DIAGNOSIS — T40605A Adverse effect of unspecified narcotics, initial encounter: Secondary | ICD-10-CM | POA: Diagnosis present

## 2019-01-14 DIAGNOSIS — R0602 Shortness of breath: Secondary | ICD-10-CM

## 2019-01-14 DIAGNOSIS — J811 Chronic pulmonary edema: Secondary | ICD-10-CM | POA: Diagnosis not present

## 2019-01-14 DIAGNOSIS — G894 Chronic pain syndrome: Secondary | ICD-10-CM | POA: Diagnosis present

## 2019-01-14 DIAGNOSIS — Z01810 Encounter for preprocedural cardiovascular examination: Secondary | ICD-10-CM

## 2019-01-14 DIAGNOSIS — R911 Solitary pulmonary nodule: Secondary | ICD-10-CM

## 2019-01-14 DIAGNOSIS — N400 Enlarged prostate without lower urinary tract symptoms: Secondary | ICD-10-CM | POA: Diagnosis present

## 2019-01-14 DIAGNOSIS — Z96642 Presence of left artificial hip joint: Secondary | ICD-10-CM | POA: Diagnosis present

## 2019-01-14 DIAGNOSIS — K567 Ileus, unspecified: Secondary | ICD-10-CM | POA: Diagnosis not present

## 2019-01-14 DIAGNOSIS — F17210 Nicotine dependence, cigarettes, uncomplicated: Secondary | ICD-10-CM | POA: Diagnosis present

## 2019-01-14 DIAGNOSIS — Z20828 Contact with and (suspected) exposure to other viral communicable diseases: Secondary | ICD-10-CM | POA: Diagnosis present

## 2019-01-14 DIAGNOSIS — M5442 Lumbago with sciatica, left side: Secondary | ICD-10-CM | POA: Diagnosis not present

## 2019-01-14 DIAGNOSIS — Z6833 Body mass index (BMI) 33.0-33.9, adult: Secondary | ICD-10-CM

## 2019-01-14 DIAGNOSIS — C3432 Malignant neoplasm of lower lobe, left bronchus or lung: Secondary | ICD-10-CM | POA: Diagnosis present

## 2019-01-14 DIAGNOSIS — Z7951 Long term (current) use of inhaled steroids: Secondary | ICD-10-CM

## 2019-01-14 DIAGNOSIS — R918 Other nonspecific abnormal finding of lung field: Secondary | ICD-10-CM | POA: Diagnosis not present

## 2019-01-14 DIAGNOSIS — Z7984 Long term (current) use of oral hypoglycemic drugs: Secondary | ICD-10-CM

## 2019-01-14 DIAGNOSIS — J9600 Acute respiratory failure, unspecified whether with hypoxia or hypercapnia: Secondary | ICD-10-CM

## 2019-01-14 DIAGNOSIS — I4891 Unspecified atrial fibrillation: Secondary | ICD-10-CM | POA: Diagnosis not present

## 2019-01-14 DIAGNOSIS — Z9049 Acquired absence of other specified parts of digestive tract: Secondary | ICD-10-CM

## 2019-01-14 DIAGNOSIS — Z833 Family history of diabetes mellitus: Secondary | ICD-10-CM | POA: Diagnosis not present

## 2019-01-14 DIAGNOSIS — R140 Abdominal distension (gaseous): Secondary | ICD-10-CM

## 2019-01-14 DIAGNOSIS — Z09 Encounter for follow-up examination after completed treatment for conditions other than malignant neoplasm: Secondary | ICD-10-CM

## 2019-01-14 DIAGNOSIS — M79605 Pain in left leg: Secondary | ICD-10-CM | POA: Diagnosis not present

## 2019-01-14 DIAGNOSIS — J984 Other disorders of lung: Secondary | ICD-10-CM

## 2019-01-14 HISTORY — PX: VIDEO BRONCHOSCOPY: SHX5072

## 2019-01-14 HISTORY — PX: THORACOTOMY: SHX5074

## 2019-01-14 LAB — CBC
HCT: 41.4 % (ref 39.0–52.0)
Hemoglobin: 13.7 g/dL (ref 13.0–17.0)
MCH: 29.2 pg (ref 26.0–34.0)
MCHC: 33.1 g/dL (ref 30.0–36.0)
MCV: 88.3 fL (ref 80.0–100.0)
Platelets: 218 10*3/uL (ref 150–400)
RBC: 4.69 MIL/uL (ref 4.22–5.81)
RDW: 13 % (ref 11.5–15.5)
WBC: 17.4 10*3/uL — ABNORMAL HIGH (ref 4.0–10.5)
nRBC: 0 % (ref 0.0–0.2)

## 2019-01-14 LAB — BASIC METABOLIC PANEL
Anion gap: 10 (ref 5–15)
BUN: 14 mg/dL (ref 6–20)
CO2: 25 mmol/L (ref 22–32)
Calcium: 8.4 mg/dL — ABNORMAL LOW (ref 8.9–10.3)
Chloride: 102 mmol/L (ref 98–111)
Creatinine, Ser: 0.97 mg/dL (ref 0.61–1.24)
GFR calc Af Amer: >60 mL/min (ref >60)
GFR calc non Af Amer: >60 mL/min (ref >60)
Glucose, Bld: 217 mg/dL — ABNORMAL HIGH (ref 70–99)
Potassium: 4.4 mmol/L (ref 3.5–5.1)
Sodium: 137 mmol/L (ref 135–145)

## 2019-01-14 LAB — GLUCOSE, CAPILLARY
Glucose-Capillary: 109 mg/dL — ABNORMAL HIGH (ref 70–99)
Glucose-Capillary: 154 mg/dL — ABNORMAL HIGH (ref 70–99)
Glucose-Capillary: 193 mg/dL — ABNORMAL HIGH (ref 70–99)
Glucose-Capillary: 171 mg/dL — ABNORMAL HIGH (ref 70–99)

## 2019-01-14 LAB — MRSA PCR SCREENING: MRSA by PCR: NEGATIVE

## 2019-01-14 SURGERY — BRONCHOSCOPY, WITH FLUOROSCOPY
Anesthesia: General | Laterality: Left

## 2019-01-14 MED ORDER — GLUCOSE BLOOD VI STRP: 1.0000 | ORAL_STRIP | Status: DC | PRN

## 2019-01-14 MED ORDER — LABETALOL HCL 5 MG/ML IV SOLN
INTRAVENOUS | Status: DC | PRN
  Administered 2019-01-14: 10 mg via INTRAVENOUS
  Administered 2019-01-14 (×3): 5 mg via INTRAVENOUS
  Administered 2019-01-14: 10 mg via INTRAVENOUS

## 2019-01-14 MED ORDER — LACTATED RINGERS IV SOLN
INTRAVENOUS | Status: DC
  Administered 2019-01-14: 22:00:00 via INTRAVENOUS

## 2019-01-14 MED ORDER — MONTELUKAST SODIUM 10 MG PO TABS
10.0000 mg | ORAL_TABLET | Freq: Every day | ORAL | Status: DC
  Administered 2019-01-15 – 2019-01-24 (×9): 10 mg via ORAL
  Filled 2019-01-14 (×10): qty 1

## 2019-01-14 MED ORDER — LABETALOL HCL 5 MG/ML IV SOLN
INTRAVENOUS | Status: AC
  Filled 2019-01-14: qty 4

## 2019-01-14 MED ORDER — HYDROMORPHONE HCL 1 MG/ML IJ SOLN
0.2500 mg | INTRAMUSCULAR | Status: AC | PRN
  Administered 2019-01-14 (×8): 0.25 mg via INTRAVENOUS

## 2019-01-14 MED ORDER — FENTANYL CITRATE (PF) 100 MCG/2ML IJ SOLN
INTRAMUSCULAR | Status: AC
  Administered 2019-01-14: 25 ug via INTRAVENOUS
  Filled 2019-01-14: qty 2

## 2019-01-14 MED ORDER — FENTANYL CITRATE (PF) 100 MCG/2ML IJ SOLN
25.0000 ug | INTRAMUSCULAR | Status: AC | PRN
  Administered 2019-01-14 (×6): 25 ug via INTRAVENOUS

## 2019-01-14 MED ORDER — METFORMIN HCL ER 500 MG PO TB24
1000.0000 mg | ORAL_TABLET | Freq: Two times a day (BID) | ORAL | Status: DC
  Administered 2019-01-15 – 2019-01-21 (×12): 1000 mg via ORAL
  Filled 2019-01-14 (×19): qty 2

## 2019-01-14 MED ORDER — LIDOCAINE HCL (CARDIAC) PF 100 MG/5ML IV SOSY
PREFILLED_SYRINGE | INTRAVENOUS | Status: DC | PRN
  Administered 2019-01-14: 100 mg via INTRAVENOUS

## 2019-01-14 MED ORDER — PHENYLEPHRINE HCL (PRESSORS) 10 MG/ML IV SOLN
INTRAVENOUS | Status: DC | PRN
  Administered 2019-01-14 (×3): 50 ug via INTRAVENOUS
  Administered 2019-01-14: 100 ug via INTRAVENOUS

## 2019-01-14 MED ORDER — DEXAMETHASONE SODIUM PHOSPHATE 10 MG/ML IJ SOLN
INTRAMUSCULAR | Status: DC | PRN
  Administered 2019-01-14: 10 mg via INTRAVENOUS

## 2019-01-14 MED ORDER — CHLORHEXIDINE GLUCONATE CLOTH 2 % EX PADS
6.0000 | MEDICATED_PAD | Freq: Once | CUTANEOUS | Status: AC
  Administered 2019-01-14: 6 via TOPICAL

## 2019-01-14 MED ORDER — TRAMADOL HCL 50 MG PO TABS
50.0000 mg | ORAL_TABLET | Freq: Four times a day (QID) | ORAL | Status: DC
  Administered 2019-01-14 – 2019-01-15 (×2): 50 mg via ORAL
  Filled 2019-01-14 (×2): qty 1

## 2019-01-14 MED ORDER — SODIUM CHLORIDE (PF) 0.9 % IJ SOLN
INTRAMUSCULAR | Status: AC
  Filled 2019-01-14: qty 50

## 2019-01-14 MED ORDER — BUPIVACAINE HCL (PF) 0.25 % IJ SOLN
INTRAMUSCULAR | Status: AC
  Filled 2019-01-14: qty 30

## 2019-01-14 MED ORDER — BISACODYL 5 MG PO TBEC
10.0000 mg | DELAYED_RELEASE_TABLET | Freq: Every day | ORAL | Status: DC
  Administered 2019-01-16 – 2019-01-25 (×8): 10 mg via ORAL
  Filled 2019-01-14 (×11): qty 2

## 2019-01-14 MED ORDER — PANTOPRAZOLE SODIUM 40 MG PO TBEC
40.0000 mg | DELAYED_RELEASE_TABLET | Freq: Every day | ORAL | Status: DC
  Administered 2019-01-15 – 2019-01-25 (×10): 40 mg via ORAL
  Filled 2019-01-14 (×11): qty 1

## 2019-01-14 MED ORDER — SODIUM CHLORIDE 0.9 % IV SOLN
INTRAVENOUS | Status: DC | PRN
  Administered 2019-01-14: 70 mL

## 2019-01-14 MED ORDER — KETAMINE HCL 50 MG/ML IJ SOLN
INTRAMUSCULAR | Status: AC
  Filled 2019-01-14: qty 10

## 2019-01-14 MED ORDER — BUPIVACAINE HCL 0.25 % IJ SOLN
INTRAMUSCULAR | Status: DC | PRN
  Administered 2019-01-14: 30 mL

## 2019-01-14 MED ORDER — ALBUTEROL SULFATE (2.5 MG/3ML) 0.083% IN NEBU: 2.5000 mg | INHALATION_SOLUTION | Freq: Four times a day (QID) | RESPIRATORY_TRACT | Status: DC | PRN

## 2019-01-14 MED ORDER — NICOTINE 21 MG/24HR TD PT24
21.0000 mg | MEDICATED_PATCH | Freq: Every day | TRANSDERMAL | Status: DC
  Filled 2019-01-14 (×5): qty 1

## 2019-01-14 MED ORDER — MORPHINE SULFATE (PF) 2 MG/ML IV SOLN
INTRAVENOUS | Status: AC
  Administered 2019-01-14: 2 mg via INTRAVENOUS
  Filled 2019-01-14: qty 1

## 2019-01-14 MED ORDER — LACTATED RINGERS IV SOLN
INTRAVENOUS | Status: DC | PRN
  Administered 2019-01-14 (×2): via INTRAVENOUS

## 2019-01-14 MED ORDER — HYDROMORPHONE HCL 1 MG/ML IJ SOLN
INTRAMUSCULAR | Status: AC
  Administered 2019-01-14: 0.25 mg via INTRAVENOUS
  Filled 2019-01-14: qty 1

## 2019-01-14 MED ORDER — HYDRALAZINE HCL 20 MG/ML IJ SOLN
5.0000 mg | Freq: Once | INTRAMUSCULAR | Status: AC
  Administered 2019-01-14: 5 mg via INTRAVENOUS

## 2019-01-14 MED ORDER — ONDANSETRON HCL 4 MG/2ML IJ SOLN
INTRAMUSCULAR | Status: DC | PRN
  Administered 2019-01-14: 4 mg via INTRAVENOUS

## 2019-01-14 MED ORDER — ONDANSETRON HCL 4 MG/2ML IJ SOLN
4.0000 mg | Freq: Four times a day (QID) | INTRAMUSCULAR | Status: DC | PRN
  Administered 2019-01-16 – 2019-01-21 (×3): 4 mg via INTRAVENOUS
  Filled 2019-01-14 (×3): qty 2

## 2019-01-14 MED ORDER — PROPOFOL 10 MG/ML IV BOLUS
INTRAVENOUS | Status: AC
  Filled 2019-01-14: qty 20

## 2019-01-14 MED ORDER — PROPOFOL 10 MG/ML IV BOLUS
INTRAVENOUS | Status: DC | PRN
  Administered 2019-01-14 (×2): 20 mg via INTRAVENOUS
  Administered 2019-01-14: 200 mg via INTRAVENOUS
  Administered 2019-01-14: 50 mg via INTRAVENOUS
  Administered 2019-01-14: 20 mg via INTRAVENOUS

## 2019-01-14 MED ORDER — BUPIVACAINE LIPOSOME 1.3 % IJ SUSP
INTRAMUSCULAR | Status: AC
  Filled 2019-01-14: qty 20

## 2019-01-14 MED ORDER — HYDRALAZINE HCL 50 MG PO TABS
100.0000 mg | ORAL_TABLET | Freq: Two times a day (BID) | ORAL | Status: DC
  Administered 2019-01-14 – 2019-01-25 (×22): 100 mg via ORAL
  Filled 2019-01-14 (×22): qty 2

## 2019-01-14 MED ORDER — CHLORHEXIDINE GLUCONATE CLOTH 2 % EX PADS: 6.0000 | MEDICATED_PAD | Freq: Once | CUTANEOUS | Status: DC

## 2019-01-14 MED ORDER — MORPHINE SULFATE (PF) 2 MG/ML IV SOLN
1.0000 mg | INTRAVENOUS | Status: DC | PRN
  Administered 2019-01-14 – 2019-01-21 (×15): 2 mg via INTRAVENOUS
  Filled 2019-01-14 (×15): qty 1

## 2019-01-14 MED ORDER — MIDAZOLAM HCL 2 MG/2ML IJ SOLN
INTRAMUSCULAR | Status: AC
  Filled 2019-01-14: qty 2

## 2019-01-14 MED ORDER — VANCOMYCIN HCL IN DEXTROSE 1-5 GM/200ML-% IV SOLN
1000.0000 mg | Freq: Two times a day (BID) | INTRAVENOUS | Status: AC
  Administered 2019-01-15 (×2): 1000 mg via INTRAVENOUS
  Filled 2019-01-14 (×3): qty 200

## 2019-01-14 MED ORDER — NICOTINE POLACRILEX 2 MG MT GUM
2.0000 mg | CHEWING_GUM | OROMUCOSAL | Status: DC | PRN
  Filled 2019-01-14: qty 1

## 2019-01-14 MED ORDER — SODIUM CHLORIDE 0.9 % IV SOLN: INTRAVENOUS | Status: DC | PRN

## 2019-01-14 MED ORDER — HYDROMORPHONE HCL 1 MG/ML IJ SOLN
INTRAMUSCULAR | Status: DC | PRN
  Administered 2019-01-14 (×2): 0.5 mg via INTRAVENOUS

## 2019-01-14 MED ORDER — SODIUM CHLORIDE 0.9 % IV SOLN
INTRAVENOUS | Status: DC
  Administered 2019-01-14: 12:00:00 via INTRAVENOUS

## 2019-01-14 MED ORDER — ATORVASTATIN CALCIUM 10 MG PO TABS
10.0000 mg | ORAL_TABLET | Freq: Every day | ORAL | Status: DC
  Administered 2019-01-15 – 2019-01-22 (×6): 10 mg via ORAL
  Filled 2019-01-14 (×8): qty 1

## 2019-01-14 MED ORDER — ONDANSETRON HCL 4 MG/2ML IJ SOLN: 4.0000 mg | Freq: Once | INTRAMUSCULAR | Status: DC | PRN

## 2019-01-14 MED ORDER — METOPROLOL SUCCINATE ER 100 MG PO TB24
100.0000 mg | ORAL_TABLET | Freq: Two times a day (BID) | ORAL | Status: DC
  Administered 2019-01-14 – 2019-01-15 (×2): 100 mg via ORAL
  Filled 2019-01-14 (×2): qty 2

## 2019-01-14 MED ORDER — KCL IN DEXTROSE-NACL 20-5-0.45 MEQ/L-%-% IV SOLN
INTRAVENOUS | Status: DC
  Administered 2019-01-14: 75 mL/h via INTRAVENOUS
  Filled 2019-01-14 (×2): qty 1000

## 2019-01-14 MED ORDER — HYDROMORPHONE HCL 1 MG/ML IJ SOLN
INTRAMUSCULAR | Status: AC
  Filled 2019-01-14: qty 1

## 2019-01-14 MED ORDER — SUGAMMADEX SODIUM 200 MG/2ML IV SOLN
INTRAVENOUS | Status: AC
  Filled 2019-01-14: qty 4

## 2019-01-14 MED ORDER — FENTANYL CITRATE (PF) 100 MCG/2ML IJ SOLN
INTRAMUSCULAR | Status: AC
  Filled 2019-01-14: qty 2

## 2019-01-14 MED ORDER — PHENYLEPHRINE HCL (PRESSORS) 10 MG/ML IV SOLN: INTRAVENOUS | Status: DC | PRN

## 2019-01-14 MED ORDER — FENTANYL CITRATE (PF) 100 MCG/2ML IJ SOLN
INTRAMUSCULAR | Status: DC | PRN
  Administered 2019-01-14 (×2): 50 ug via INTRAVENOUS
  Administered 2019-01-14 (×4): 25 ug via INTRAVENOUS

## 2019-01-14 MED ORDER — NORTRIPTYLINE HCL 25 MG PO CAPS
50.0000 mg | ORAL_CAPSULE | Freq: Every day | ORAL | Status: DC
  Administered 2019-01-14 – 2019-01-24 (×6): 50 mg via ORAL
  Filled 2019-01-14 (×14): qty 2

## 2019-01-14 MED ORDER — ROCURONIUM BROMIDE 100 MG/10ML IV SOLN
INTRAVENOUS | Status: DC | PRN
  Administered 2019-01-14: 50 mg via INTRAVENOUS
  Administered 2019-01-14 (×2): 20 mg via INTRAVENOUS
  Administered 2019-01-14 (×2): 10 mg via INTRAVENOUS

## 2019-01-14 MED ORDER — OXYCODONE HCL 5 MG PO TABS
5.0000 mg | ORAL_TABLET | ORAL | Status: DC | PRN
  Administered 2019-01-14: 10 mg via ORAL
  Filled 2019-01-14: qty 2

## 2019-01-14 MED ORDER — VANCOMYCIN HCL IN DEXTROSE 1-5 GM/200ML-% IV SOLN
INTRAVENOUS | Status: AC
  Filled 2019-01-14: qty 200

## 2019-01-14 MED ORDER — SUGAMMADEX SODIUM 200 MG/2ML IV SOLN
INTRAVENOUS | Status: DC | PRN
  Administered 2019-01-14: 212.8 mg via INTRAVENOUS

## 2019-01-14 MED ORDER — HYDRALAZINE HCL 20 MG/ML IJ SOLN
INTRAMUSCULAR | Status: AC
  Filled 2019-01-14: qty 1

## 2019-01-14 MED ORDER — KETAMINE HCL 50 MG/ML IJ SOLN
INTRAMUSCULAR | Status: DC | PRN
  Administered 2019-01-14: 20 mg via INTRAMUSCULAR

## 2019-01-14 MED ORDER — MIDAZOLAM HCL 2 MG/2ML IJ SOLN
INTRAMUSCULAR | Status: DC | PRN
  Administered 2019-01-14: 2 mg via INTRAVENOUS

## 2019-01-14 SURGICAL SUPPLY — 74 items
BENZOIN TINCTURE PRP APPL 2/3 (GAUZE/BANDAGES/DRESSINGS) ×2 IMPLANT
BNDG COHESIVE 4X5 TAN STRL (GAUZE/BANDAGES/DRESSINGS) IMPLANT
BRONCHOSCOPE PED SLIM DISP (MISCELLANEOUS) ×2 IMPLANT
CANISTER SUCT 1200ML W/VALVE (MISCELLANEOUS) ×2 IMPLANT
CATH THOR STR 32F 8032 SOFT WA (CATHETERS) ×2 IMPLANT
CATH THORACIC RT ANG 32FR SOFT (CATHETERS) ×2 IMPLANT
CATH URET ROBINSON 16FR STRL (CATHETERS) ×2 IMPLANT
CHLORAPREP W/TINT 26 (MISCELLANEOUS) ×4 IMPLANT
CNTNR SPEC 2.5X3XGRAD LEK (MISCELLANEOUS) ×4
CONN REDUCER 3/8X3/8X3/8Y (CONNECTOR) ×2
CONNECTOR REDUCER 3/8X3/8 (MISCELLANEOUS) ×4 IMPLANT
CONNECTOR REDUCER 3/8X3/8X3/8Y (CONNECTOR) ×1 IMPLANT
CONT SPEC 4OZ STER OR WHT (MISCELLANEOUS) ×4
CONTAINER SPEC 2.5X3XGRAD LEK (MISCELLANEOUS) ×4 IMPLANT
CUTTER ECHEON FLEX ENDO 45 340 (ENDOMECHANICALS) IMPLANT
DRAIN CHEST DRY SUCT SGL (MISCELLANEOUS) ×2 IMPLANT
DRAPE C-SECTION (MISCELLANEOUS) ×2 IMPLANT
DRAPE MAG INST 16X20 L/F (DRAPES) ×2 IMPLANT
DRSG OPSITE POSTOP 3X4 (GAUZE/BANDAGES/DRESSINGS) ×2 IMPLANT
DRSG OPSITE POSTOP 4X10 (GAUZE/BANDAGES/DRESSINGS) ×2 IMPLANT
DRSG OPSITE POSTOP 4X12 (GAUZE/BANDAGES/DRESSINGS) ×2 IMPLANT
DRSG OPSITE POSTOP 4X6 (GAUZE/BANDAGES/DRESSINGS) ×2 IMPLANT
DRSG OPSITE POSTOP 4X8 (GAUZE/BANDAGES/DRESSINGS) ×2 IMPLANT
DRSG TEGADERM 6X8 (GAUZE/BANDAGES/DRESSINGS) ×4 IMPLANT
DRSG TELFA 3X8 NADH (GAUZE/BANDAGES/DRESSINGS) ×2 IMPLANT
ELECT BLADE 6.5 EXT (BLADE) ×2 IMPLANT
ELECT CAUTERY BLADE TIP 2.5 (TIP) ×2
ELECT REM PT RETURN 9FT ADLT (ELECTROSURGICAL) ×2
ELECTRODE CAUTERY BLDE TIP 2.5 (TIP) ×1 IMPLANT
ELECTRODE REM PT RTRN 9FT ADLT (ELECTROSURGICAL) ×1 IMPLANT
GAUZE 4X4 16PLY RFD (DISPOSABLE) ×2 IMPLANT
GAUZE SPONGE 4X4 12PLY STRL (GAUZE/BANDAGES/DRESSINGS) ×2 IMPLANT
GLOVE BIO SURGEON STRL SZ7 (GLOVE) ×2 IMPLANT
GLOVE SURG SYN 7.5  E (GLOVE) ×2
GLOVE SURG SYN 7.5 E (GLOVE) ×2 IMPLANT
GOWN STRL REUS W/ TWL LRG LVL3 (GOWN DISPOSABLE) ×3 IMPLANT
GOWN STRL REUS W/TWL LRG LVL3 (GOWN DISPOSABLE) ×3
KIT TURNOVER KIT A (KITS) ×2 IMPLANT
LABEL OR SOLS (LABEL) ×2 IMPLANT
LOOP RED MAXI  1X406MM (MISCELLANEOUS) ×1
LOOP VESSEL MAXI 1X406 RED (MISCELLANEOUS) ×1 IMPLANT
MARKER SKIN DUAL TIP RULER LAB (MISCELLANEOUS) ×2 IMPLANT
NEEDLE HYPO 22GX1.5 SAFETY (NEEDLE) ×2 IMPLANT
NEEDLE SPNL 22GX3.5 QUINCKE BK (NEEDLE) ×2 IMPLANT
PACK BASIN MAJOR ARMC (MISCELLANEOUS) ×2 IMPLANT
RELOAD PROXIMATE TA60MM GREEN (ENDOMECHANICALS) IMPLANT
RELOAD STAPLER LINE PROX 30 GR (STAPLE) ×1 IMPLANT
SOL ANTI-FOG 6CC FOG-OUT (MISCELLANEOUS) ×1 IMPLANT
SOL FOG-OUT ANTI-FOG 6CC (MISCELLANEOUS) ×1
SPONGE KITTNER 5P (MISCELLANEOUS) ×4 IMPLANT
STAPLE RELOAD 2.5MM WHITE (STAPLE) ×6 IMPLANT
STAPLER RELOAD LINE PROX 30 GR (STAPLE) ×2
STAPLER SKIN PROX 35W (STAPLE) ×2 IMPLANT
STAPLER VASCULAR ECHELON 35 (CUTTER) ×2 IMPLANT
STRIP CLOSURE SKIN 1/2X4 (GAUZE/BANDAGES/DRESSINGS) ×2 IMPLANT
SUT MNCRL AB 3-0 PS2 27 (SUTURE) IMPLANT
SUT PROLENE 5 0 RB 1 DA (SUTURE) IMPLANT
SUT SILK 0 (SUTURE) ×1
SUT SILK 0 30XBRD TIE 6 (SUTURE) ×1 IMPLANT
SUT SILK 1 SH (SUTURE) ×14 IMPLANT
SUT VIC AB 0 CT1 36 (SUTURE) ×4 IMPLANT
SUT VIC AB 2-0 CT1 27 (SUTURE) ×4
SUT VIC AB 2-0 CT1 TAPERPNT 27 (SUTURE) ×4 IMPLANT
SUT VICRYL 2 TP 1 (SUTURE) ×6 IMPLANT
SYR 10ML SLIP (SYRINGE) ×2 IMPLANT
SYR BULB IRRIG 60ML STRL (SYRINGE) ×2 IMPLANT
TAPE CLOTH 3X10 WHT NS LF (GAUZE/BANDAGES/DRESSINGS) ×2 IMPLANT
TAPE TRANSPORE STRL 2 31045 (GAUZE/BANDAGES/DRESSINGS) IMPLANT
TRAY FOLEY MTR SLVR 16FR STAT (SET/KITS/TRAYS/PACK) ×2 IMPLANT
TROCAR FLEXIPATH 20X80 (ENDOMECHANICALS) IMPLANT
TROCAR FLEXIPATH THORACIC 15MM (ENDOMECHANICALS) IMPLANT
TUBING CONNECTING 10 (TUBING) ×2 IMPLANT
WATER STERILE IRR 1000ML POUR (IV SOLUTION) ×2 IMPLANT
YANKAUER SUCT BULB TIP FLEX NO (MISCELLANEOUS) ×4 IMPLANT

## 2019-01-14 NOTE — Consult Note (Signed)
Name: Kristopher [name] MRN: [id number] DOB: Aug 25, 1960    ADMISSION DATE:  01/14/2019 CONSULTATION DATE: 01/14/2019  REFERRING MD : Dr. Genevive Bi   CHIEF COMPLAINT: LLL lung mass   BRIEF PATIENT DESCRIPTION:  58 yo male admitted with LLL lung mass s/p bronchoscopy and left thoracotomy with left lower lobectomy   SIGNIFICANT EVENTS/STUDIES:  11/12: Pt admitted to ICU post left thoracotomy with left lower lobectomy   HISTORY OF PRESENT ILLNESS:   This is a 58 yo male with a PMH Migraines, Former Tobacco Abuse, HTN, GERD, Dyspnea, Type II Diabetes Mellitus, Chronic Back Pain, Benign Hypertensive Kidney Disease, Arthritis, and Allergies. He presented to [facility] on 11/12 to undergo a bronchoscopy, left thoracotomy with left lower lobectomy, and insertion of 2 left sided chest tubes due to non-small cell carcinoma of left lung favoring squamous cell.  He was subsequently admitted to ICU postop for additional workup and treatment PCCM consulted to assist with management.   PAST MEDICAL HISTORY :   has a past medical history of Allergy, Arthritis, Benign hypertensive kidney disease, Chronic back pain, Diabetes mellitus, type 2 ([place]), Dyspnea, GERD (gastroesophageal reflux disease), Hypertension, and Migraines.  has a past surgical history that includes Back surgery; Appendectomy; Leg Surgery; Knee surgery (Left); Foot surgery (Left); Colonoscopy with propofol (N/A, 02/19/2016); polypectomy (N/A, 02/19/2016); Total hip arthroplasty (Left, 12/02/2017); Colonoscopy with propofol (N/A, 01/19/2018); polypectomy (01/19/2018); DG OPERATIVE LEFT HIP ([place] HX); Joint replacement (Left, 12/2017); and Electormagnetic navigation bronchoscopy (Left, 11/18/2018). Prior to Admission medications   Medication Sig Start Date End Date Taking? Authorizing Provider  albuterol (PROVENTIL HFA;VENTOLIN HFA) 108 (90 Base) MCG/ACT inhaler Inhale 2 puffs into the lungs every 6 (six) hours as needed for wheezing or shortness of  breath. 05/01/18  Yes [place], [name], DO  atorvastatin (LIPITOR) 10 MG tablet TAKE 1 TABLET BY MOUTH EVERY DAY Patient taking differently: Take 10 mg by mouth daily.  10/08/18  Yes Johnson, [name], DO  esomeprazole (NEXIUM) 40 MG capsule Take 1 capsule (40 mg total) by mouth daily. Patient taking differently: Take 40 mg by mouth daily as needed (acid reflux).  10/01/17  Yes Johnson, [name], DO  Fluticasone Furoate (ARNUITY ELLIPTA) 100 MCG/ACT AEPB Inhale 1 puff into the lungs daily. 11/30/18  Yes Tyler Pita, MD  hydrALAZINE (APRESOLINE) 100 MG tablet Take 1 tablet (100 mg total) by mouth 2 (two) times daily. 09/22/18  Yes Johnson, [name], DO  metFORMIN (GLUCOPHAGE-XR) 500 MG 24 hr tablet TAKE 2 TABLETS (1,000 MG TOTAL) BY MOUTH 2 (TWO) TIMES DAILY WITH A MEAL. Patient taking differently: Take 1,000 mg by mouth 2 (two) times daily with a meal.  12/28/18  Yes Johnson, [name], DO  metoprolol succinate (TOPROL-XL) 100 MG 24 hr tablet 125 mg in the morning, and 125 mg in the evening (to be taken with the 25mg  pill to equal 125mg ) Patient taking differently: Take 100 mg by mouth 2 (two) times daily.  11/28/18  Yes Johnson, [name], DO  metoprolol succinate (TOPROL-XL) 25 MG 24 hr tablet To be taken with the 100mg  for a total of 125mg  BID Patient taking differently: Take 25 mg by mouth 2 (two) times daily.  12/01/18  Yes Johnson, [name], DO  montelukast (SINGULAIR) 10 MG tablet Take 1 tablet (10 mg total) by mouth at bedtime. 09/22/18  Yes Johnson, [name], DO  nicotine (NICODERM CQ) 21 mg/24hr patch Place 1 patch (21 mg total) onto the skin daily. 01/01/19  Yes Orene Desanctis,  Lilia Argue, PA-C  nicotine polacrilex ([facility] NICOTINE) 2 MG gum Take 1 each (2 mg total) by mouth as needed for smoking cessation. 01/01/19  Yes Volney American, PA-C  nortriptyline (PAMELOR) 25 MG capsule Take 2 capsules (50 mg total) by mouth at bedtime. 09/22/18  Yes Johnson, [name], DO  Oxycodone HCl 10 MG TABS Take 10  mg by mouth 4 (four) times daily.  11/27/18  Yes [provider]  Potassium Chloride ER 20 MEQ TBCR Take 20 mEq by mouth daily. 01/11/19  Yes Johnson, [name], DO  SUMAtriptan (IMITREX) 100 MG tablet TAKE ONE TABLET AT ONSET OF HEADACHE MAY REPEAT IN TWO HOURS IF NECESSARY LIMIT OF TWO IN 24 HOURS Patient taking differently: Take 100 mg by mouth every 2 (two) hours as needed for migraine or headache.  12/05/17  Yes Johnson, [name], DO  ACCU-CHEK GUIDE test strip USE TO TEST BLOOD SUGAR 2X A DAY 07/16/18   [provider]   Allergies  Allergen Reactions  . Aspirin Anaphylaxis  . Epinephrine Anaphylaxis  . Lidocaine Anaphylaxis  . Novocain [Procaine] Anaphylaxis  . Penicillins Anaphylaxis    Has patient had a PCN reaction causing immediate rash, facial/tongue/throat swelling, SOB or lightheadedness with hypotension: Yes Has patient had a PCN reaction causing severe rash involving mucus membranes or skin necrosis: No Has patient had a PCN reaction that required hospitalization: Yes Has patient had a PCN reaction occurring within the last 10 years: No If all of the above answers are "NO", then may proceed with Cephalosporin use.   . Strawberry Extract Anaphylaxis  . Shellfish Allergy Hives and Nausea And Vomiting    FAMILY HISTORY:  family history includes Cancer in his father; Diabetes in his sister; Thrombosis in his sister. SOCIAL HISTORY:  reports that he has quit smoking. He smoked 0.00 packs per day for 35.00 years. He has never used smokeless tobacco. He reports current drug use. Drug: Oxycodone. He reports that he does not drink alcohol.  REVIEW OF SYSTEMS: Positives in BOLD  Constitutional: Negative for fever, chills, weight loss, malaise/fatigue and diaphoresis.  HENT: Negative for hearing loss, ear pain, nosebleeds, congestion, sore throat, neck pain, tinnitus and ear discharge.   Eyes: Negative for blurred vision, double vision, photophobia, pain, discharge and  redness.  Respiratory: left sided chest pain at chest tube sites, cough, hemoptysis, sputum production, shortness of breath, wheezing and stridor.   Cardiovascular: Negative for chest pain, palpitations, orthopnea, claudication, leg swelling and PND.  Gastrointestinal: Negative for heartburn, nausea, vomiting, abdominal pain, diarrhea, constipation, blood in stool and melena.  Genitourinary: Negative for dysuria, urgency, frequency, hematuria and flank pain.  Musculoskeletal: Negative for myalgias, back pain, joint pain and falls.  Skin: Negative for itching and rash.  Neurological: Negative for dizziness, tingling, tremors, sensory change, speech change, focal weakness, seizures, loss of consciousness, weakness and headaches.  Endo/Heme/Allergies: Negative for environmental allergies and polydipsia. Does not bruise/bleed easily.  SUBJECTIVE:  c/o pain at left sided chest tube insertion sites   VITAL SIGNS: Temp:  [97.3 F (36.3 C)-98 F (36.7 C)] 97.6 F (36.4 C) (11/12 1950) Pulse Rate:  [72-150] 84 (11/12 2125) Resp:  [8-34] 21 (11/12 2125) BP: (117-157)/(77-116) 136/91 (11/12 2120) SpO2:  [91 %-100 %] 100 % (11/12 2125) Arterial Line BP: (158-174)/(93-111) 160/97 (11/12 1743) Weight:  [106.4 kg] 106.4 kg (11/12 1112)  PHYSICAL EXAMINATION: General: well developed, well nourished male, NAD  Neuro: alert and oriented, follows commands  HEENT: supple, no JVD  Cardiovascular: nsr, rrr, no R/G  Lungs: diminished on left with faint expiratory wheezes throughout, even, non labored; 1 chest tube along left paravertebral space and 1 chest tube at the left apex of chest with sanguinous drainage  Abdomen: +BS x4, obese, soft, non tender, non distended  Musculoskeletal: normal bulk and tone, no edema  Skin: surgical incision at left paravertebral space and left apex of chest   Recent Labs  Lab 01/08/19 1155 01/14/19 2021  NA 135 137  K 3.4* 4.4  CL 100 102  CO2 25 25  BUN 13 14   CREATININE 0.86 0.97  GLUCOSE 243* 217*   Recent Labs  Lab 01/08/19 1155 01/14/19 1847  HGB 13.5 13.7  HCT 39.3 41.4  WBC 7.9 17.4*  PLT 177 218   Dg Chest Port 1 View  Result Date: 01/14/2019 CLINICAL DATA:  Postop thoracotomy, left. EXAM: PORTABLE CHEST 1 VIEW COMPARISON:  Preoperative radiograph 12/16/2018, CT 11/17/2018 FINDINGS: Two left-sided chest tubes in place, 1 directed towards the apex, 1 towards the base. No definite visualized pneumothorax. Subcutaneous emphysema about the left lateral chest wall with adjacent skin staples. Lung volumes are low. Upper normal heart size, accentuated by low lung volumes. Bronchovascular crowding without pulmonary edema. No focal abnormality in the right lung. IMPRESSION: 1. Two left-sided chest tubes in place. No definite pneumothorax. Subcutaneous emphysema about the left lateral chest wall. 2. Low lung volumes with bronchovascular crowding. Electronically Signed   By: Keith Rake M.D.   On: 01/14/2019 17:59    ASSESSMENT / PLAN:  LLL lung mass s/p bronchoscopy and left thoracotomy with left lower lobectomy  Postop pain  Supplemental O2 for dyspnea and/or hypoxia Prn bronchodilator therapy  Left chest tubes to suction @20  cm  Monitor chest tube drainage output Prn tramadol, morphine, and oxycodone for pain management Surgical prophylaxis vancomycin x2 doses  Aggressive pulmonary hygiene  Repeat CXR in am   Hypertension  Continuous telemetry monitoring  Continue outpatient cardiac medications   GERD Continue protonix   Type II diabetes mellitus  Continue outpatient metformin  CBG's q4hrs   Marda Stalker, [place] Pager [phone] (please enter 7 digits) PCCM Consult Pager [phone] (please enter 7 digits)

## 2019-01-14 NOTE — Anesthesia Post-op Follow-up Note (Signed)
Anesthesia QCDR form completed.        

## 2019-01-14 NOTE — Anesthesia Procedure Notes (Signed)
Procedure Name: Intubation Date/Time: 01/14/2019 1:18 PM Performed by: Jerrye Noble, CRNA Pre-anesthesia Checklist: Patient identified, Emergency Drugs available, Suction available and Patient being monitored Patient Re-evaluated:Patient Re-evaluated prior to induction Oxygen Delivery Method: Circle system utilized Preoxygenation: Pre-oxygenation with 100% oxygen Induction Type: IV induction Ventilation: Mask ventilation without difficulty Laryngoscope Size: McGraph and 4 Grade View: Grade I Endobronchial tube: Double lumen EBT and EBT position confirmed by fiberoptic bronchoscope and 39 Fr Number of attempts: 1 Airway Equipment and Method: Stylet and Oral airway Placement Confirmation: ETT inserted through vocal cords under direct vision,  positive ETCO2 and breath sounds checked- equal and bilateral Tube secured with: Tape Dental Injury: Teeth and Oropharynx as per pre-operative assessment

## 2019-01-14 NOTE — Progress Notes (Deleted)
Removed right arterial line per order.  Pressure held for 5 minutes.  Site CDI.  Pt tolerated well

## 2019-01-14 NOTE — Progress Notes (Signed)
[place] Progress Note Patient Name: Kristopher [name] DOB: 1961/01/21 MRN: [id number]   Date of Service  01/14/2019  HPI/Events of Note  Pt admitted to the ICU post-operatively following a left lower lobectomy of the lung for carcinoma. He was extubated post-operatively.  eICU Interventions  New patient evaluation completed.        [name] [name] 01/14/2019, 9:35 PM

## 2019-01-14 NOTE — Transfer of Care (Signed)
Immediate Anesthesia Transfer of Care Note  Patient: Kristopher [name]  Procedure(s) Performed: VIDEO BRONCHOSCOPY WITH FLUORO, LEFT (Left ) THORACOTOMY MAJOR, LEFT (Left )  Patient Location: PACU  Anesthesia Type:General  Level of Consciousness: drowsy and patient cooperative  Airway & Oxygen Therapy: Patient Spontanous Breathing and Patient connected to face mask oxygen  Post-op Assessment: Report given to RN and Post -op Vital signs reviewed and stable  Post vital signs: Reviewed and stable  Last Vitals:  Vitals Value Taken Time  BP 143/100 01/14/19 1709  Temp    Pulse 80 01/14/19 1714  Resp 15 01/14/19 1714  SpO2 98 % 01/14/19 1714  Vitals shown include unvalidated device data.  Last Pain:  Vitals:   01/14/19 1112  TempSrc: Oral  PainSc: 0-No pain         Complications: No apparent anesthesia complications

## 2019-01-14 NOTE — Interval H&P Note (Signed)
History and Physical Interval Note:  01/14/2019 11:36 AM  Kristopher [name]  has presented today for surgery, with the diagnosis of LUNG MASS.  The various methods of treatment have been discussed with the patient and family. After consideration of risks, benefits and other options for treatment, the patient has consented to  Procedure(s): VIDEO BRONCHOSCOPY WITH FLUORO, LEFT (Left) THORACOTOMY MAJOR, LEFT (Left) as a surgical intervention.  The patient's history has been reviewed, patient examined, no change in status, stable for surgery.  I have reviewed the patient's chart and labs.  Questions were answered to the patient's satisfaction.     Nestor Lewandowsky

## 2019-01-14 NOTE — Progress Notes (Signed)
Removed right arterial line per order.  Pressure held for 5 minutes.  Site CDI, 2x2 gauze and paper tape applied.  Pt tolerated well.

## 2019-01-14 NOTE — Anesthesia Preprocedure Evaluation (Signed)
Anesthesia Evaluation  Patient identified by MRN, date of birth, ID band Patient awake    Reviewed: Allergy & Precautions, NPO status , Patient's Chart, lab work & pertinent test results, reviewed documented beta blocker date and time   History of Anesthesia Complications Negative for: history of anesthetic complications  Airway Mallampati: III       Dental   Pulmonary neg sleep apnea, neg COPD, Patient abstained from smoking.Not current smoker, former smoker,           Cardiovascular hypertension, Pt. on medications and Pt. on home beta blockers (-) Past MI and (-) CHF (-) dysrhythmias (-) Valvular Problems/Murmurs     Neuro/Psych neg Seizures    GI/Hepatic Neg liver ROS, GERD  Medicated and Controlled,  Endo/Other  diabetes, Type 2, Oral Hypoglycemic Agents  Renal/GU Renal InsufficiencyRenal disease     Musculoskeletal   Abdominal   Peds  Hematology   Anesthesia Other Findings   Reproductive/Obstetrics                             Anesthesia Physical Anesthesia Plan  ASA: III  Anesthesia Plan: General   Post-op Pain Management:    Induction: Intravenous  PONV Risk Score and Plan: 2 and Ondansetron and Dexamethasone  Airway Management Planned: Double Lumen EBT  Additional Equipment:   Intra-op Plan:   Post-operative Plan:   Informed Consent: I have reviewed the patients History and Physical, chart, labs and discussed the procedure including the risks, benefits and alternatives for the proposed anesthesia with the patient or authorized representative who has indicated his/her understanding and acceptance.       Plan Discussed with:   Anesthesia Plan Comments:         Anesthesia Quick Evaluation

## 2019-01-14 NOTE — Anesthesia Postprocedure Evaluation (Signed)
Anesthesia Post Note  Patient: Kristopher [name]  Procedure(s) Performed: VIDEO BRONCHOSCOPY WITH FLUORO, LEFT (Left ) THORACOTOMY MAJOR, LEFT (Left )  Patient location during evaluation: PACU Anesthesia Type: General Level of consciousness: awake and alert Pain management: pain level controlled Vital Signs Assessment: post-procedure vital signs reviewed and stable Respiratory status: spontaneous breathing, nonlabored ventilation and respiratory function stable Cardiovascular status: blood pressure returned to baseline and stable Postop Assessment: no apparent nausea or vomiting Anesthetic complications: no     Last Vitals:  Vitals:   01/14/19 2015 01/14/19 2020  BP: 127/78   Pulse: 82 84  Resp: 12 13  Temp:    SpO2: 97% 97%    Last Pain:  Vitals:   01/14/19 1950  TempSrc:   PainSc: Bliss

## 2019-01-14 NOTE — Anesthesia Procedure Notes (Signed)
Arterial Line Insertion Start/End11/02/2019 1:15 PM, 01/14/2019 1:17 PM Performed by: Gunnar Fusi, MD, Jerrye Noble, CRNA, CRNA  Patient location: OR. Preanesthetic checklist: patient identified, IV checked, site marked, risks and benefits discussed, surgical consent, monitors and equipment checked, pre-op evaluation, timeout performed and anesthesia consent Patient sedated Right, radial was placed Catheter size: 20 G Hand hygiene performed   Attempts: 1 Procedure performed without using ultrasound guided technique. Following insertion, dressing applied. Post procedure assessment: normal  Patient tolerated the procedure well with no immediate complications.

## 2019-01-14 NOTE — Op Note (Signed)
  01/14/2019  5:38 PM  PATIENT:  Kristopher [name]  58 y.o. male  PRE-OPERATIVE DIAGNOSIS: Carcinoma of the left lower lobe  POST-OPERATIVE DIAGNOSIS: Carcinoma of the left lower lobe  PROCEDURE: Preoperative bronchoscopy to assess endobronchial anatomy; left thoracotomy with left lower lobectomy  SURGEON:  Surgeon(s) and Role:    * Nestor Lewandowsky, MD - Primary    * Pabon, Marjory Lies, MD  ASSISTANTS: Evelina Bucy, PAS  ANESTHESIA: General  INDICATIONS FOR PROCEDURE this patient is a 58 year old gentleman with a enlarging left lower lobe mass.  He underwent percutaneous biopsy revealing a non-small cell carcinoma.  The indications and risks of surgical resection were explained the patient in detail and he gave his informed consent.  DICTATION: The patient was brought to the operating suite placed in supine position.  General endotracheal anesthesia was given through a double-lumen tube.  Preoperative bronchoscopy was carried out for tube position and to assess the endobronchial anatomy.  There is no evidence of tumor or secretions.  The tube was secured in proper position and then the patient was turned for left thoracotomy.  All pressure points were carefully padded.  Patient was prepped and draped in usual sterile fashion.  A posterior lateral fifth interspace thoracotomy was performed.  The latissimus muscle was divided but the serratus muscle was spared.  The chest was entered through the fifth interspace.  The inferior pulmonary ligament was divided up to the level of the inferior pulmonary vein.  The fissure was near complete and we dissected out the branches of the pulmonary artery.  The lingular artery was identified.  The branches to the lower lobe were 2 and number consisting of a large basilar branch as well as a superior segmental branch.  The pulmonary arterial branches were secured using an endoscopic stapler.  The pulmonary vein was likewise transected using an endoscopic stapler.   The only remaining structure was the bronchus.  There were really no lymph nodes to sweep up into the lung specimen but we transected the bronchus using a TX 30 stapling device.  Before stapling the bronchus we ventilated the lung.  The left upper lobe ventilated nicely.  The stapler was fired and the lung was divided.  Hemostasis was then assessed and it was complete.  The bronchial stump was then checked at 30 cm of water pressure and there is no evidence of an air leak.  2 chest tubes were inserted.  We placed one chest tube along the paravertebral space.  This was the posterior tubing was brought out through the thoracoscopy port.  The other chest tube was placed to the apex of the chest.  Visualization again was occurred.  There is no evidence of hemorrhage and the chest was then closed in standard fashion.  #2 Vicryl pericostal sutures were used to approximate the ribs.  The latissimus dorsi muscle was approximated using #2 Vicryl.  The subcutaneous tissues with 2-0 Vicryl and the skin with skin clips.  The thoracoscopy port was also closed with skin clips after closing the deeper layers with 2-0 Vicryl.  The patient was then extubated taken to the recovery room in stable condition.  Prior to the specimen being sent to pathology I did check along the bronchial margins and did identify several smaller nodes.  These were sent separately.  In addition we had found several in the paraesophageal region that were sent during the case as well.   Nestor Lewandowsky, MD

## 2019-01-15 ENCOUNTER — Inpatient Hospital Stay: Payer: Medicare Other

## 2019-01-15 ENCOUNTER — Encounter: Payer: Self-pay | Admitting: Cardiothoracic Surgery

## 2019-01-15 DIAGNOSIS — E119 Type 2 diabetes mellitus without complications: Secondary | ICD-10-CM

## 2019-01-15 DIAGNOSIS — Z9689 Presence of other specified functional implants: Secondary | ICD-10-CM

## 2019-01-15 DIAGNOSIS — I10 Essential (primary) hypertension: Secondary | ICD-10-CM

## 2019-01-15 LAB — CBC WITH DIFFERENTIAL/PLATELET
Abs Immature Granulocytes: 0.09 10*3/uL — ABNORMAL HIGH (ref 0.00–0.07)
Basophils Absolute: 0 10*3/uL (ref 0.0–0.1)
Basophils Relative: 0 %
Eosinophils Absolute: 0 10*3/uL (ref 0.0–0.5)
Eosinophils Relative: 0 %
HCT: 39.7 % (ref 39.0–52.0)
Hemoglobin: 13.2 g/dL (ref 13.0–17.0)
Immature Granulocytes: 1 %
Lymphocytes Relative: 9 %
Lymphs Abs: 1.3 10*3/uL (ref 0.7–4.0)
MCH: 29.5 pg (ref 26.0–34.0)
MCHC: 33.2 g/dL (ref 30.0–36.0)
MCV: 88.8 fL (ref 80.0–100.0)
Monocytes Absolute: 0.7 10*3/uL (ref 0.1–1.0)
Monocytes Relative: 5 %
Neutro Abs: 11.6 10*3/uL — ABNORMAL HIGH (ref 1.7–7.7)
Neutrophils Relative %: 85 %
Platelets: 203 10*3/uL (ref 150–400)
RBC: 4.47 MIL/uL (ref 4.22–5.81)
RDW: 13.1 % (ref 11.5–15.5)
WBC: 13.7 10*3/uL — ABNORMAL HIGH (ref 4.0–10.5)
nRBC: 0 % (ref 0.0–0.2)

## 2019-01-15 LAB — BASIC METABOLIC PANEL
Anion gap: 9 (ref 5–15)
BUN: 14 mg/dL (ref 6–20)
CO2: 26 mmol/L (ref 22–32)
Calcium: 8.3 mg/dL — ABNORMAL LOW (ref 8.9–10.3)
Chloride: 102 mmol/L (ref 98–111)
Creatinine, Ser: 0.97 mg/dL (ref 0.61–1.24)
GFR calc Af Amer: >60 mL/min (ref >60)
GFR calc non Af Amer: >60 mL/min (ref >60)
Glucose, Bld: 193 mg/dL — ABNORMAL HIGH (ref 70–99)
Potassium: 4.3 mmol/L (ref 3.5–5.1)
Sodium: 137 mmol/L (ref 135–145)

## 2019-01-15 LAB — GLUCOSE, CAPILLARY
Glucose-Capillary: 189 mg/dL — ABNORMAL HIGH (ref 70–99)
Glucose-Capillary: 170 mg/dL — ABNORMAL HIGH (ref 70–99)
Glucose-Capillary: 152 mg/dL — ABNORMAL HIGH (ref 70–99)
Glucose-Capillary: 160 mg/dL — ABNORMAL HIGH (ref 70–99)
Glucose-Capillary: 138 mg/dL — ABNORMAL HIGH (ref 70–99)

## 2019-01-15 LAB — MAGNESIUM: Magnesium: 1.9 mg/dL (ref 1.7–2.4)

## 2019-01-15 LAB — HEMOGLOBIN A1C
Hgb A1c MFr Bld: 7.4 % — ABNORMAL HIGH (ref 4.8–5.6)
Mean Plasma Glucose: 165.68 mg/dL

## 2019-01-15 MED ORDER — HYDROMORPHONE HCL 1 MG/ML IJ SOLN
1.0000 mg | INTRAMUSCULAR | Status: AC
  Administered 2019-01-15: 1 mg via INTRAVENOUS
  Filled 2019-01-15: qty 1

## 2019-01-15 MED ORDER — ENOXAPARIN SODIUM 40 MG/0.4ML ~~LOC~~ SOLN
40.0000 mg | SUBCUTANEOUS | Status: DC
  Administered 2019-01-16 – 2019-01-18 (×3): 40 mg via SUBCUTANEOUS
  Filled 2019-01-15 (×3): qty 0.4

## 2019-01-15 MED ORDER — HYDRALAZINE HCL 20 MG/ML IJ SOLN
10.0000 mg | Freq: Four times a day (QID) | INTRAMUSCULAR | Status: DC | PRN
  Administered 2019-01-15: 10 mg via INTRAVENOUS
  Filled 2019-01-15: qty 1

## 2019-01-15 MED ORDER — INSULIN ASPART 100 UNIT/ML ~~LOC~~ SOLN: 0.0000 [IU] | Freq: Every day | SUBCUTANEOUS | Status: DC

## 2019-01-15 MED ORDER — CHLORHEXIDINE GLUCONATE CLOTH 2 % EX PADS
6.0000 | MEDICATED_PAD | Freq: Every day | CUTANEOUS | Status: DC
  Administered 2019-01-15 – 2019-01-17 (×3): 6 via TOPICAL

## 2019-01-15 MED ORDER — METOPROLOL SUCCINATE ER 50 MG PO TB24
125.0000 mg | ORAL_TABLET | Freq: Two times a day (BID) | ORAL | Status: DC
  Administered 2019-01-15 – 2019-01-18 (×7): 125 mg via ORAL
  Administered 2019-01-19: 25 mg via ORAL
  Administered 2019-01-19 – 2019-01-20 (×3): 125 mg via ORAL
  Filled 2019-01-15: qty 1
  Filled 2019-01-15 (×2): qty 3
  Filled 2019-01-15: qty 1
  Filled 2019-01-15 (×3): qty 3
  Filled 2019-01-15 (×3): qty 1
  Filled 2019-01-15: qty 3

## 2019-01-15 MED ORDER — METHOCARBAMOL 750 MG PO TABS
750.0000 mg | ORAL_TABLET | Freq: Four times a day (QID) | ORAL | Status: DC
  Administered 2019-01-15 – 2019-01-20 (×19): 750 mg via ORAL
  Filled 2019-01-15 (×30): qty 1

## 2019-01-15 MED ORDER — TRAMADOL HCL 50 MG PO TABS: 100.0000 mg | ORAL_TABLET | Freq: Four times a day (QID) | ORAL | Status: DC

## 2019-01-15 MED ORDER — HYDRALAZINE HCL 20 MG/ML IJ SOLN: 10.0000 mg | Freq: Four times a day (QID) | INTRAMUSCULAR | Status: DC | PRN

## 2019-01-15 MED ORDER — OXYCODONE HCL 5 MG PO TABS
10.0000 mg | ORAL_TABLET | Freq: Four times a day (QID) | ORAL | Status: DC
  Administered 2019-01-15 – 2019-01-23 (×33): 10 mg via ORAL
  Filled 2019-01-15 (×34): qty 2

## 2019-01-15 MED ORDER — INSULIN ASPART 100 UNIT/ML ~~LOC~~ SOLN
0.0000 [IU] | Freq: Three times a day (TID) | SUBCUTANEOUS | Status: DC
  Administered 2019-01-15 – 2019-01-16 (×3): 2 [IU] via SUBCUTANEOUS
  Administered 2019-01-17: 3 [IU] via SUBCUTANEOUS
  Administered 2019-01-18: 1 [IU] via SUBCUTANEOUS
  Administered 2019-01-18: 2 [IU] via SUBCUTANEOUS
  Administered 2019-01-19 – 2019-01-21 (×2): 1 [IU] via SUBCUTANEOUS
  Administered 2019-01-21: 2 [IU] via SUBCUTANEOUS
  Administered 2019-01-21 – 2019-01-22 (×2): 1 [IU] via SUBCUTANEOUS
  Administered 2019-01-22 – 2019-01-23 (×2): 3 [IU] via SUBCUTANEOUS
  Administered 2019-01-23 – 2019-01-24 (×2): 1 [IU] via SUBCUTANEOUS
  Administered 2019-01-25: 2 [IU] via SUBCUTANEOUS
  Filled 2019-01-15 (×16): qty 1

## 2019-01-15 MED ORDER — INSULIN ASPART 100 UNIT/ML ~~LOC~~ SOLN: 0.0000 [IU] | Freq: Three times a day (TID) | SUBCUTANEOUS | Status: DC

## 2019-01-15 NOTE — Progress Notes (Signed)
Only real complaint this morning is his pain.  He has a long standing pain management team in the outpatient setting  VSS, Afeb  No air leak seen  Chest tube drainage is serous to serosanguinous  Dressing a some blood soilage but is now dry  Lungs are clear on the right with some mechanical chest tube sounds on the left Heart regular  Will transfer to floor Ask for our hospitalists to help with management of hypertension and diabetes Out of bed with physical therapy DC Foley  [employer].

## 2019-01-15 NOTE — Progress Notes (Addendum)
Consult Note                                                      Kristopher [name]  FIE:[id number]  DOB: 1960-06-18  DOA: 01/14/2019  PCP: [name], DO      Requesting physician: Dr. Faith Rogue (thoracic surgery)  Reason for consultation: Medical management of hypertension and diabetes   History of Present llness   Kristopher [name] is an 58 y.o. male with history of hypertension, GERD, type 2 diabetes mellitus, chronic back pain, essential hypertension with hypertensive kidney disease, arthritis, history of migraine who presented to [facility] on 11/12 for a bronchoscopy, left thoracotomy and left lower lobe lobectomy with insertion of 2 left-sided chest tubes due to non-small cell carcinoma of the left lung. Postoperatively he was admitted to ICU and then transferred to medical floor. Hospitalist consulted for uncontrolled hypertension and management of his diabetes mellitus.  Patient reports soreness in the chest tube site.  Denies any shortness of breath, headache, dizziness, fever, chills, nausea, vomiting, palpitations, chest tightness, abdominal pain, dysuria or diarrhea.  At home he is on hydralazine 100 mg twice daily and metoprolol 125 mg twice daily.  His CBG remain well controlled at home on Metformin.  Blood pressure persistently elevated today in the range of 135-172/90-120 mmHg.  CBG stable.  Reports he quit smoking 3 weeks back.  Informed that her blood pressure tends to escalate when he is in pain.  Informs that current pain regiment is effective but wears off quickly.   Review Of Systems     In addition to the HPI above,  No Fever-chills, No Headache, No changes with Vision or hearing, No problems swallowing food or Liquids, No chest pain, cough with whitish phlegm, no shortness of breath No Abdominal pain, No Nausea or vomiting,  Bowel movements are regular, No Blood in stool or Urine, No dysuria, No new skin rashes or bruises, No new joints pains-aches,  No new weakness, tingling, numbness in any extremity, No recent weight gain or loss, No polyuria, polydypsia or polyphagia, No significant Mental Stressors.   Social History   Social History   Tobacco Use  . Smoking status: Former Smoker    Packs/day: 0.00    Years: 35.00    Pack years: 0.00  . Smokeless tobacco: Never Used  Substance Use Topics  . Alcohol use: No    Alcohol/week: 0.0 standard drinks     Family History   Family History  Problem Relation Age of Onset  . Cancer Father   .  Diabetes Sister   . Thrombosis Sister      Medications   Prior to Admission medications   Medication Sig Start Date End Date Taking? Authorizing Provider  albuterol (PROVENTIL HFA;VENTOLIN HFA) 108 (90 Base) MCG/ACT inhaler Inhale 2 puffs into the lungs every 6 (six) hours as needed for wheezing or shortness of breath. 05/01/18  Yes Johnson, [name], DO  atorvastatin (LIPITOR) 10 MG tablet TAKE 1 TABLET BY MOUTH EVERY DAY Patient taking differently: Take 10 mg by mouth daily.  10/08/18  Yes Johnson, [name], DO  esomeprazole (NEXIUM) 40 MG capsule Take 1 capsule (40 mg total) by mouth daily. Patient taking differently: Take 40 mg by mouth daily as needed (acid reflux).  10/01/17  Yes Johnson, [name], DO  Fluticasone Furoate (ARNUITY ELLIPTA) 100 MCG/ACT AEPB Inhale 1 puff into the lungs daily. 11/30/18  Yes Tyler Pita, MD  hydrALAZINE (APRESOLINE) 100 MG tablet Take 1 tablet (100 mg total) by mouth 2 (two) times daily. 09/22/18  Yes Johnson, [name], DO  metFORMIN (GLUCOPHAGE-XR) 500 MG 24 hr tablet TAKE 2 TABLETS (1,000 MG TOTAL) BY MOUTH 2 (TWO) TIMES DAILY WITH A MEAL. Patient taking differently: Take 1,000 mg by mouth 2 (two) times daily with a meal.  12/28/18  Yes Johnson, [name], DO  metoprolol succinate (TOPROL-XL) 100 MG 24 hr tablet 125 mg in the  morning, and 125 mg in the evening (to be taken with the 25mg  pill to equal 125mg ) Patient taking differently: Take 100 mg by mouth 2 (two) times daily.  11/28/18  Yes Johnson, [name], DO  metoprolol succinate (TOPROL-XL) 25 MG 24 hr tablet To be taken with the 100mg  for a total of 125mg  BID Patient taking differently: Take 25 mg by mouth 2 (two) times daily.  12/01/18  Yes Johnson, [name], DO  montelukast (SINGULAIR) 10 MG tablet Take 1 tablet (10 mg total) by mouth at bedtime. 09/22/18  Yes Johnson, [name], DO  nicotine (NICODERM CQ) 21 mg/24hr patch Place 1 patch (21 mg total) onto the skin daily. 01/01/19  Yes Volney American, PA-C  nicotine polacrilex ([employer] NICOTINE) 2 MG gum Take 1 each (2 mg total) by mouth as needed for smoking cessation. 01/01/19  Yes Volney American, PA-C  nortriptyline (PAMELOR) 25 MG capsule Take 2 capsules (50 mg total) by mouth at bedtime. 09/22/18  Yes Johnson, [name], DO  Oxycodone HCl 10 MG TABS Take 10 mg by mouth 4 (four) times daily.  11/27/18  Yes [provider]  Potassium Chloride ER 20 MEQ TBCR Take 20 mEq by mouth daily. 01/11/19  Yes Johnson, [name], DO  SUMAtriptan (IMITREX) 100 MG tablet TAKE ONE TABLET AT ONSET OF HEADACHE MAY REPEAT IN TWO HOURS IF NECESSARY LIMIT OF TWO IN 24 HOURS Patient taking differently: Take 100 mg by mouth every 2 (two) hours as needed for migraine or headache.  12/05/17  Yes Johnson, [name], DO  ACCU-CHEK GUIDE test strip USE TO TEST BLOOD SUGAR 2X A DAY 07/16/18   [provider]    Anti-infectives (From admission, onward)   Start     Dose/Rate Route Frequency Ordered Stop   01/15/19 0000  vancomycin (VANCOCIN) IVPB 1000 mg/200 mL premix     1,000 mg 200 mL/hr over 60 Minutes Intravenous Every 12 hours 01/14/19 2118 01/15/19 1402   01/14/19 1137  vancomycin (VANCOCIN) 1-5 GM/200ML-% IVPB    Note to Pharmacy: Calton Dach   : cabinet override  01/14/19 1137 01/14/19 2344   01/14/19  0600  vancomycin (VANCOCIN) IVPB 1000 mg/200 mL premix     1,000 mg 200 mL/hr over 60 Minutes Intravenous On call to O.R. 01/13/19 2322 01/14/19 1308      Scheduled Meds: . atorvastatin  10 mg Oral Daily  . bisacodyl  10 mg Oral Daily  . Chlorhexidine Gluconate Cloth  6 each Topical Daily  . [START ON 01/16/2019] enoxaparin (LOVENOX) injection  40 mg Subcutaneous Q24H  . hydrALAZINE  100 mg Oral BID  . insulin aspart  0-5 Units Subcutaneous QHS  . insulin aspart  0-9 Units Subcutaneous TID WC  . metFORMIN  1,000 mg Oral BID WC  . methocarbamol  750 mg Oral QID  . metoprolol succinate  100 mg Oral BID  . montelukast  10 mg Oral QHS  . nicotine  21 mg Transdermal Daily  . nortriptyline  50 mg Oral QHS  . oxyCODONE  10 mg Oral QID  . pantoprazole  40 mg Oral Daily   Continuous Infusions: . sodium chloride     PRN Meds:.Place/Maintain arterial line **AND** sodium chloride, albuterol, morphine injection, nicotine polacrilex, ondansetron (ZOFRAN) IV  Allergies  Allergen Reactions  . Aspirin Anaphylaxis  . Epinephrine Anaphylaxis  . Lidocaine Anaphylaxis  . Novocain [Procaine] Anaphylaxis  . Penicillins Anaphylaxis    Has patient had a PCN reaction causing immediate rash, facial/tongue/throat swelling, SOB or lightheadedness with hypotension: Yes Has patient had a PCN reaction causing severe rash involving mucus membranes or skin necrosis: No Has patient had a PCN reaction that required hospitalization: Yes Has patient had a PCN reaction occurring within the last 10 years: No If all of the above answers are "NO", then may proceed with Cephalosporin use.   . Strawberry Extract Anaphylaxis  . Shellfish Allergy Hives and Nausea And Vomiting    Objective   Physical Exam  Vitals  Blood pressure (!) 172/110, pulse 92, temperature 98.3 F (36.8 C), temperature source Oral, resp. rate 20, height 5\' 10"  (1.778 m), weight 106.4 kg, SpO2 93 %.   General: Middle-aged male in no  acute distress HEENT: Moist mucosa, supple neck Chest: Left-sided chest tube in place, clear breath sounds bilaterally CVs: Normal S1-S2, no murmur rub or gallop GI: Soft, nondistended, nontender, bowel sounds present, Foley in place Musculoskeletal: Warm, no edema, CNS: Alert and oriented, nonfocal  CBC Recent Labs  Lab 01/14/19 1847 01/15/19 0306  WBC 17.4* 13.7*  HGB 13.7 13.2  HCT 41.4 39.7  PLT 218 203  MCV 88.3 88.8  MCH 29.2 29.5  MCHC 33.1 33.2  RDW 13.0 13.1  LYMPHSABS  --  1.3  MONOABS  --  0.7  EOSABS  --  0.0  BASOSABS  --  0.0   ------------------------------------------------------------------------------------------------------------------  Chemistries  Recent Labs  Lab 01/14/19 2021 01/15/19 0306  NA 137 137  K 4.4 4.3  CL 102 102  CO2 25 26  GLUCOSE 217* 193*  BUN 14 14  CREATININE 0.97 0.97  CALCIUM 8.4* 8.3*  MG  --  1.9   ------------------------------------------------------------------------------------------------------------------ estimated creatinine clearance is 103.9 mL/min (by C-G formula based on SCr of 0.97 mg/dL). ------------------------------------------------------------------------------------------------------------------ No results for input(s): TSH, T4TOTAL, T3FREE, THYROIDAB in the last 72 hours.  Invalid input(s): FREET3   Coagulation profile No results for input(s): INR, PROTIME in the last 168 hours. ------------------------------------------------------------------------------------------------------------------- No results for input(s): DDIMER in the last 72 hours. -------------------------------------------------------------------------------------------------------------------  Cardiac Enzymes No results for input(s): CKMB, TROPONINI, MYOGLOBIN in the last  168 hours.  Invalid input(s): CK ------------------------------------------------------------------------------------------------------------------ Invalid  input(s): POCBNP   ---------------------------------------------------------------------------------------------------------------  Urinalysis    Component Value Date/Time   COLORURINE AMBER (A) 10/21/2017 1130   APPEARANCEUR TURBID (A) 10/21/2017 1130   APPEARANCEUR Clear 06/24/2016 0915   LABSPEC 1.025 10/21/2017 1130   PHURINE 5.0 10/21/2017 1130   GLUCOSEU >=500 (A) 10/21/2017 1130   HGBUR NEGATIVE 10/21/2017 1130   BILIRUBINUR NEGATIVE 10/21/2017 1130   BILIRUBINUR Negative 06/24/2016 0915   KETONESUR NEGATIVE 10/21/2017 1130   PROTEINUR 30 (A) 10/21/2017 1130   NITRITE NEGATIVE 10/21/2017 1130   LEUKOCYTESUR NEGATIVE 10/21/2017 1130   LEUKOCYTESUR Negative 06/24/2016 0915     Imaging    Dg Chest 2 View  Result Date: 01/15/2019 CLINICAL DATA:  Postoperative EXAM: CHEST - 2 VIEW COMPARISON:  01/14/2019 FINDINGS: No significant change in examination status post left thoracotomy with left-sided chest tubes in position and no significant pneumothorax. Very low volume examination with mild diffuse interstitial opacity, likely bronchovascular crowding. No new airspace opacity. Mild cardiomegaly. Subcutaneous emphysema about the left chest wall. IMPRESSION: 1. No significant change in examination status post left thoracotomy with left-sided chest tubes in position and no significant pneumothorax. 2. Very low volume examination with mild diffuse interstitial opacity, likely bronchovascular crowding. No new airspace opacity. Electronically Signed   By: Eddie Candle M.D.   On: 01/15/2019 09:19   Dg Chest Port 1 View  Result Date: 01/14/2019 CLINICAL DATA:  Postop thoracotomy, left. EXAM: PORTABLE CHEST 1 VIEW COMPARISON:  Preoperative radiograph 12/16/2018, CT 11/17/2018 FINDINGS: Two left-sided chest tubes in place, 1 directed towards the apex, 1 towards the base. No definite visualized pneumothorax. Subcutaneous emphysema about the left lateral chest wall with adjacent skin staples.  Lung volumes are low. Upper normal heart size, accentuated by low lung volumes. Bronchovascular crowding without pulmonary edema. No focal abnormality in the right lung. IMPRESSION: 1. Two left-sided chest tubes in place. No definite pneumothorax. Subcutaneous emphysema about the left lateral chest wall. 2. Low lung volumes with bronchovascular crowding. Electronically Signed   By: Keith Rake M.D.   On: 01/14/2019 17:59      Assessment & Paln   Assessment Uncontrolled hypertension Likely triggered by pain.  Unclear if his blood pressure is well controlled as outpatient.  He is prescribed hydralazine 100 mg twice daily and Toprol-XL 125 mg twice daily.  Both have been resumed (patient was on started back on Toprol 100 mg twice daily and have increased the dose to 125 twice daily).  Added as needed IV hydralazine.  Recommend adequate pain control.  Incentive spirometry. If blood pressure still uncontrolled can increase hydralazine to 100 mg 3 times daily.  Diabetes mellitus type 2, controlled On metformin which is continued.  CBG stable in the range of 150-190.  Monitor on sliding scale coverage.  Non small cell lung ca  recently diagnosed. S/p  bronchoscopy, left thoracotomy and left lower lobe lobectomy with insertion of 2 left-sided chest tubes. Management per primary. Quit smoking 3 weeks back. conitnue nicotine patch.  dyslipidemia  on statin   DVT Prophylaxis : Lovenox -     Family Communication: Plan discussed with patient   Thank you for the consult, we will follow the patient with you in the Hospital.   Total time spent: 50 minutes.  Flonnie Overman Ardath Lepak M.D on 01/15/2019 at 4:23 PM  Between 7am to 7pm - Pager - [phone]. After 7pm go to [email or web address] - password [facility]  [facility]  - Office  [phone]  Thank you for the consult, we will follow the patient with you in the Hospital.

## 2019-01-15 NOTE — Progress Notes (Signed)
PT Cancellation Note  Patient Details Name: Kristopher [name] MRN: [id number] DOB: [birth year]   Cancelled Treatment:    Reason Eval/Treat Not Completed: Other (comment)(Pt being prepared to transfer to floor at this time. PT re-attempt this PM.)  Lieutenant Diego PT, DPT 10:46 AM,01/15/19 [phone]

## 2019-01-15 NOTE — Progress Notes (Addendum)
Inpatient Diabetes Program Recommendations  AACE/ADA: New Consensus Statement on Inpatient Glycemic Control (2015)  Target Ranges:  Prepandial:   less than 140 mg/dL      Peak postprandial:   less than 180 mg/dL (1-2 hours)      Critically ill patients:  140 - 180 mg/dL   Results for GAREK, SCHUNEMAN (MRN [id number]) as of 01/15/2019 11:21  Ref. Range 01/14/2019 11:23 01/14/2019 17:31 01/14/2019 21:20  Glucose-Capillary Latest Ref Range: 70 - 99 mg/dL 109 (H) 154 (H) 193 (H)   Results for BADER, STUBBLEFIELD (MRN [id number]) as of 01/15/2019 11:21  Ref. Range 01/14/2019 23:27 01/15/2019 04:15 01/15/2019 07:28  Glucose-Capillary Latest Ref Range: 70 - 99 mg/dL 171 (H) 189 (H) 170 (H)    Admit with: Lung Mass/ Lobectomy  History: DM  Home DM Meds: Metformin 1000 mg BID  Current Orders: Metformin 1000 mg BID     MD- Please consider the following in-hospital insulin adjustments:  1. Start Novolog Sensitive Correction Scale/ SSI (0-9 units) TID AC + HS  2. Change PO diet to Carbohydrate Modified diet     --Will follow patient during hospitalization--  Wyn Quaker RN, MSN, CDE Diabetes Coordinator Inpatient Glycemic Control Team Team Pager: [phone] (8a-5p)

## 2019-01-15 NOTE — Evaluation (Signed)
Physical Therapy Evaluation Patient Details Name: Kristopher [name] MRN: [id number] DOB: 1960-05-18 Today's Date: 01/15/2019   History of Present Illness  Pt is 58 yo male that presented to [facility] on 11/12 to undergo a bronchoscopy, left thoracotomy with left lower lobectomy, and insertion of 2 left sided chest tubes due to non-small cell carcinoma of left lung favoring squamous cell.  He was subsequently admitted to ICU postop and has transferred to floor. PMH Migraines, Former Tobacco Abuse, HTN, GERD, Dyspnea, Type II Diabetes Mellitus, Chronic Back Pain, Benign Hypertensive Kidney Disease, Arthritis, and Allergies.    Clinical Impression  The patient was easily woken at start of session, endorsed L sided chest/flank pain with mobility/ambulation 5/10. Pt lives at home with wife and grandchild in a one story home, previously independent at baseline.   The patient was educated about log rolling technique, able to perform with fair carryover, though significant effort needed to perform. The patient sat EOB for >42mins to address pain, splinting, PLB education due to mild de-saturation to mid 80s on room air after exertion. Able to improve to at least 90%. Sit <> Stand with CGA and use of external support (bed rails, counter), and ambulated in room to recliner ~57ft. Pt repositioned for comfort and educated about importance of continued mobility instructed to ambulate 2-3x a day during hospital stay.  Overall the patient demonstrated deficits (see "PT Problem List") that impede the patient's functional abilities, safety, and mobility and would benefit from skilled PT intervention. Recommendation is HHPT with intermittent supervision.     Follow Up Recommendations Home health PT;Supervision - Intermittent    Equipment Recommendations  None recommended by PT    Recommendations for Other Services       Precautions / Restrictions Precautions Precautions: Fall Precaution Comments: chest tubes,  watch BP Restrictions Weight Bearing Restrictions: No      Mobility  Bed Mobility Overal bed mobility: Modified Independent             General bed mobility comments: instructed in log rolling technique. Significant difficulty due to pain, but no true physical assistance needed  Transfers Overall transfer level: Needs assistance Equipment used: None Transfers: Sit to/from Stand Sit to Stand: Min guard         General transfer comment: utilizes UE support (bed rails, counter)  Ambulation/Gait Ambulation/Gait assistance: Min guard Gait Distance (Feet): 10 Feet Assistive device: None Gait Pattern/deviations: Wide base of support;Step-through pattern;Shuffle Gait velocity: decreased      Stairs            Wheelchair Mobility    Modified Rankin (Stroke Patients Only)       Balance Overall balance assessment: Needs assistance Sitting-balance support: Feet supported Sitting balance-Leahy Scale: Good       Standing balance-Leahy Scale: Good                               Pertinent Vitals/Pain Pain Assessment: 0-10 Pain Score: 5  Pain Descriptors / Indicators: Burning;Grimacing;Moaning;Guarding Pain Intervention(s): Limited activity within patient's tolerance;Monitored during session;Repositioned    Home Living Family/patient expects to be discharged to:: Private residence Living Arrangements: Spouse/significant other;Other relatives(grandchild) Available Help at Discharge: Family Type of Home: House Home Access: Ramped entrance     Home Layout: One level Home Equipment: Fletcher - 2 wheels;Cane - single point      Prior Function Level of Independence: Independent  Hand Dominance        Extremity/Trunk Assessment   Upper Extremity Assessment Upper Extremity Assessment: Overall WFL for tasks assessed(official MMT deferred due to pain/chest tube)    Lower Extremity Assessment Lower Extremity Assessment:  Overall WFL for tasks assessed    Cervical / Trunk Assessment Cervical / Trunk Assessment: Normal;Other exceptions Cervical / Trunk Exceptions: chest tube on L, staples/honeycomb in place L thoracic s/p thoracotomy  Communication   Communication: No difficulties  Cognition Arousal/Alertness: Awake/alert Behavior During Therapy: WFL for tasks assessed/performed Overall Cognitive Status: Within Functional Limits for tasks assessed                                        General Comments      Exercises Other Exercises Other Exercises: Pt instructed in PLB  and splinting during coughing/clearing throat. Other Exercises: mild desaturation noted on room air after transferring to sitting, able to increase >90% on room air with time and PLB.   Assessment/Plan    PT Assessment Patient needs continued PT services  PT Problem List Decreased strength;Decreased mobility;Decreased activity tolerance;Decreased balance;Pain       PT Treatment Interventions DME instruction;Therapeutic exercise;Gait training;Balance training;Neuromuscular re-education;Functional mobility training;Therapeutic activities;Patient/family education    PT Goals (Current goals can be found in the Care Plan section)  Acute Rehab PT Goals Patient Stated Goal: to go home PT Goal Formulation: With patient Time For Goal Achievement: 01/29/19 Potential to Achieve Goals: Good    Frequency Min 2X/week   Barriers to discharge        Co-evaluation               AM-PAC PT "6 Clicks" Mobility  Outcome Measure Help needed turning from your back to your side while in a flat bed without using bedrails?: A Little Help needed moving from lying on your back to sitting on the side of a flat bed without using bedrails?: A Little Help needed moving to and from a bed to a chair (including a wheelchair)?: A Little Help needed standing up from a chair using your arms (e.g., wheelchair or bedside chair)?: A  Little Help needed to walk in hospital room?: A Little Help needed climbing 3-5 steps with a railing? : A Lot 6 Click Score: 17    End of Session Equipment Utilized During Treatment: Gait belt Activity Tolerance: Patient limited by pain Patient left: in chair;with chair alarm set;with call bell/phone within reach;with SCD's reapplied Nurse Communication: Mobility status PT Visit Diagnosis: Other abnormalities of gait and mobility (R26.89);Pain;Difficulty in walking, not elsewhere classified (R26.2) Pain - Right/Left: Left Pain - part of body: (chest/flank)    Time: 1062-6948 PT Time Calculation (min) (ACUTE ONLY): 33 min   Charges:   PT Evaluation $PT Eval Low Complexity: 1 Low PT Treatments $Therapeutic Exercise: 8-22 mins $Therapeutic Activity: 8-22 mins        Lieutenant Diego PT, DPT 3:06 PM,01/15/19 [phone]

## 2019-01-16 ENCOUNTER — Inpatient Hospital Stay: Payer: Medicare Other

## 2019-01-16 ENCOUNTER — Inpatient Hospital Stay
Admission: RE | Admit: 2019-01-16 | Discharge: 2019-01-16 | Disposition: A | Discharge status: Home / Self Care | Payer: Medicare Other | Source: Home / Self Care | Attending: Nurse Practitioner | Admitting: Nurse Practitioner

## 2019-01-16 DIAGNOSIS — R0602 Shortness of breath: Secondary | ICD-10-CM

## 2019-01-16 DIAGNOSIS — I4891 Unspecified atrial fibrillation: Secondary | ICD-10-CM

## 2019-01-16 DIAGNOSIS — J9601 Acute respiratory failure with hypoxia: Secondary | ICD-10-CM

## 2019-01-16 DIAGNOSIS — C3492 Malignant neoplasm of unspecified part of left bronchus or lung: Secondary | ICD-10-CM | POA: Insufficient documentation

## 2019-01-16 DIAGNOSIS — Z85118 Personal history of other malignant neoplasm of bronchus and lung: Secondary | ICD-10-CM | POA: Insufficient documentation

## 2019-01-16 LAB — BASIC METABOLIC PANEL
Anion gap: 9 (ref 5–15)
BUN: 15 mg/dL (ref 6–20)
CO2: 29 mmol/L (ref 22–32)
Calcium: 8.9 mg/dL (ref 8.9–10.3)
Chloride: 100 mmol/L (ref 98–111)
Creatinine, Ser: 0.94 mg/dL (ref 0.61–1.24)
GFR calc Af Amer: >60 mL/min (ref >60)
GFR calc non Af Amer: >60 mL/min (ref >60)
Glucose, Bld: 156 mg/dL — ABNORMAL HIGH (ref 70–99)
Potassium: 4.2 mmol/L (ref 3.5–5.1)
Sodium: 138 mmol/L (ref 135–145)

## 2019-01-16 LAB — TYPE AND SCREEN
ABO/RH(D): A POS
Antibody Screen: NEGATIVE
Unit division: 0
Unit division: 0

## 2019-01-16 LAB — PHOSPHORUS: Phosphorus: 3.2 mg/dL (ref 2.5–4.6)

## 2019-01-16 LAB — HEPATIC FUNCTION PANEL
ALT: 59 U/L — ABNORMAL HIGH (ref 0–44)
AST: 54 U/L — ABNORMAL HIGH (ref 15–41)
Albumin: 4.3 g/dL (ref 3.5–5.0)
Alkaline Phosphatase: 103 U/L (ref 38–126)
Bilirubin, Direct: 0.2 mg/dL (ref 0.0–0.2)
Indirect Bilirubin: 1 mg/dL — ABNORMAL HIGH (ref 0.3–0.9)
Total Bilirubin: 1.2 mg/dL (ref 0.3–1.2)
Total Protein: 7.9 g/dL (ref 6.5–8.1)

## 2019-01-16 LAB — HEMOGLOBIN A1C
Hgb A1c MFr Bld: 7.5 % — ABNORMAL HIGH (ref 4.8–5.6)
Mean Plasma Glucose: 168.55 mg/dL

## 2019-01-16 LAB — CBC WITH DIFFERENTIAL/PLATELET
Abs Immature Granulocytes: 0.09 10*3/uL — ABNORMAL HIGH (ref 0.00–0.07)
Basophils Absolute: 0 10*3/uL (ref 0.0–0.1)
Basophils Relative: 0 %
Eosinophils Absolute: 0 10*3/uL (ref 0.0–0.5)
Eosinophils Relative: 0 %
HCT: 46.4 % (ref 39.0–52.0)
Hemoglobin: 14.7 g/dL (ref 13.0–17.0)
Immature Granulocytes: 1 %
Lymphocytes Relative: 21 %
Lymphs Abs: 3.6 10*3/uL (ref 0.7–4.0)
MCH: 28.8 pg (ref 26.0–34.0)
MCHC: 31.7 g/dL (ref 30.0–36.0)
MCV: 91 fL (ref 80.0–100.0)
Monocytes Absolute: 1.6 10*3/uL — ABNORMAL HIGH (ref 0.1–1.0)
Monocytes Relative: 9 %
Neutro Abs: 12.3 10*3/uL — ABNORMAL HIGH (ref 1.7–7.7)
Neutrophils Relative %: 69 %
Platelets: 252 10*3/uL (ref 150–400)
RBC: 5.1 MIL/uL (ref 4.22–5.81)
RDW: 13.4 % (ref 11.5–15.5)
WBC: 17.6 10*3/uL — ABNORMAL HIGH (ref 4.0–10.5)
nRBC: 0 % (ref 0.0–0.2)

## 2019-01-16 LAB — TROPONIN I (HIGH SENSITIVITY)
Troponin I (High Sensitivity): 19 ng/L — ABNORMAL HIGH (ref <18)
Troponin I (High Sensitivity): 21 ng/L — ABNORMAL HIGH (ref <18)

## 2019-01-16 LAB — ECHOCARDIOGRAM COMPLETE
Height: 70 in
Weight: 3752.02 oz

## 2019-01-16 LAB — LACTIC ACID, PLASMA
Lactic Acid, Venous: 2.1 mmol/L (ref 0.5–1.9)
Lactic Acid, Venous: 1.7 mmol/L (ref 0.5–1.9)

## 2019-01-16 LAB — BPAM RBC
Blood Product Expiration Date: 202012042359
Blood Product Expiration Date: 202012072359
Unit Type and Rh: 6200
Unit Type and Rh: 6200

## 2019-01-16 LAB — PREPARE RBC (CROSSMATCH)

## 2019-01-16 LAB — MAGNESIUM: Magnesium: 2.1 mg/dL (ref 1.7–2.4)

## 2019-01-16 LAB — GLUCOSE, CAPILLARY
Glucose-Capillary: 161 mg/dL — ABNORMAL HIGH (ref 70–99)
Glucose-Capillary: 145 mg/dL — ABNORMAL HIGH (ref 70–99)
Glucose-Capillary: 171 mg/dL — ABNORMAL HIGH (ref 70–99)
Glucose-Capillary: 139 mg/dL — ABNORMAL HIGH (ref 70–99)

## 2019-01-16 LAB — BRAIN NATRIURETIC PEPTIDE: B Natriuretic Peptide: 69 pg/mL (ref 0.0–100.0)

## 2019-01-16 LAB — PROCALCITONIN: Procalcitonin: 0.32 ng/mL

## 2019-01-16 MED ORDER — FLEET ENEMA 7-19 GM/118ML RE ENEM: 1.0000 | ENEMA | Freq: Once | RECTAL | Status: DC

## 2019-01-16 MED ORDER — FUROSEMIDE 10 MG/ML IJ SOLN
INTRAMUSCULAR | Status: AC
  Administered 2019-01-16: 05:00:00 40 mg via INTRAVENOUS
  Filled 2019-01-16: qty 4

## 2019-01-16 MED ORDER — POLYETHYLENE GLYCOL 3350 17 G PO PACK
17.0000 g | PACK | Freq: Every day | ORAL | Status: DC
  Administered 2019-01-18: 17 g via ORAL
  Filled 2019-01-16 (×4): qty 1

## 2019-01-16 MED ORDER — METOPROLOL TARTRATE 5 MG/5ML IV SOLN
5.0000 mg | Freq: Once | INTRAVENOUS | Status: AC
  Administered 2019-01-16: 04:00:00 5 mg via INTRAVENOUS
  Filled 2019-01-16: qty 5

## 2019-01-16 MED ORDER — LEVALBUTEROL HCL 0.63 MG/3ML IN NEBU: 0.6300 mg | INHALATION_SOLUTION | Freq: Four times a day (QID) | RESPIRATORY_TRACT | Status: DC

## 2019-01-16 MED ORDER — AMIODARONE IV BOLUS ONLY 150 MG/100ML
INTRAVENOUS | Status: AC
  Administered 2019-01-16: 150 mg via INTRAVENOUS
  Filled 2019-01-16: qty 100

## 2019-01-16 MED ORDER — FUROSEMIDE 10 MG/ML IJ SOLN
40.0000 mg | Freq: Once | INTRAMUSCULAR | Status: AC
  Administered 2019-01-16: 05:00:00 40 mg via INTRAVENOUS

## 2019-01-16 MED ORDER — NEOMYCIN-POLYMYXIN-DEXAMETH 3.5-10000-0.1 OP SUSP: 2.0000 [drp] | Freq: Four times a day (QID) | OPHTHALMIC | Status: DC

## 2019-01-16 MED ORDER — LACTULOSE 10 GM/15ML PO SOLN
30.0000 g | Freq: Once | ORAL | Status: DC
  Filled 2019-01-16: qty 60

## 2019-01-16 MED ORDER — ALBUTEROL SULFATE (2.5 MG/3ML) 0.083% IN NEBU
2.5000 mg | INHALATION_SOLUTION | Freq: Four times a day (QID) | RESPIRATORY_TRACT | Status: DC
  Administered 2019-01-16: 2.5 mg via RESPIRATORY_TRACT
  Filled 2019-01-16: qty 3

## 2019-01-16 MED ORDER — METOPROLOL TARTRATE 5 MG/5ML IV SOLN
5.0000 mg | Freq: Once | INTRAVENOUS | Status: AC
  Administered 2019-01-16: 5 mg via INTRAVENOUS
  Filled 2019-01-16: qty 5

## 2019-01-16 MED ORDER — METOPROLOL TARTRATE 5 MG/5ML IV SOLN
INTRAVENOUS | Status: AC
  Filled 2019-01-16: qty 5

## 2019-01-16 MED ORDER — LABETALOL HCL 5 MG/ML IV SOLN
10.0000 mg | Freq: Once | INTRAVENOUS | Status: AC
  Administered 2019-01-16: 06:00:00 10 mg via INTRAVENOUS

## 2019-01-16 MED ORDER — HYDRALAZINE HCL 20 MG/ML IJ SOLN
10.0000 mg | Freq: Four times a day (QID) | INTRAMUSCULAR | Status: DC | PRN
  Administered 2019-01-21: 10 mg via INTRAVENOUS
  Filled 2019-01-16: qty 1

## 2019-01-16 MED ORDER — LABETALOL HCL 5 MG/ML IV SOLN
INTRAVENOUS | Status: AC
  Administered 2019-01-16: 10 mg via INTRAVENOUS
  Filled 2019-01-16: qty 4

## 2019-01-16 MED ORDER — ALBUTEROL SULFATE (2.5 MG/3ML) 0.083% IN NEBU
2.5000 mg | INHALATION_SOLUTION | Freq: Four times a day (QID) | RESPIRATORY_TRACT | Status: DC
  Administered 2019-01-17 – 2019-01-18 (×7): 2.5 mg via RESPIRATORY_TRACT
  Filled 2019-01-16 (×8): qty 3

## 2019-01-16 MED ORDER — AMIODARONE IV BOLUS ONLY 150 MG/100ML
150.0000 mg | Freq: Once | INTRAVENOUS | Status: AC
  Administered 2019-01-16: 05:00:00 150 mg via INTRAVENOUS

## 2019-01-16 MED ORDER — ORAL CARE MOUTH RINSE
15.0000 mL | Freq: Two times a day (BID) | OROMUCOSAL | Status: DC
  Administered 2019-01-16 – 2019-01-24 (×11): 15 mL via OROMUCOSAL

## 2019-01-16 MED ORDER — IPRATROPIUM BROMIDE 0.02 % IN SOLN
0.5000 mg | Freq: Four times a day (QID) | RESPIRATORY_TRACT | Status: DC
  Administered 2019-01-17 – 2019-01-18 (×7): 0.5 mg via RESPIRATORY_TRACT
  Filled 2019-01-16 (×8): qty 2.5

## 2019-01-16 MED ORDER — FLEET ENEMA 7-19 GM/118ML RE ENEM: 1.0000 | ENEMA | Freq: Every day | RECTAL | Status: DC | PRN

## 2019-01-16 MED ORDER — METOPROLOL TARTRATE 5 MG/5ML IV SOLN
5.0000 mg | Freq: Once | INTRAVENOUS | Status: AC
  Administered 2019-01-16: 09:00:00 5 mg via INTRAVENOUS

## 2019-01-16 NOTE — Consult Note (Signed)
Cardiology Consultation Note    Patient ID: Kristopher [name], MRN: [id number], DOB/AGE: 06-14-1960 58 y.o. Admit date: 01/14/2019   Date of Consult: 01/16/2019 Primary Physician: [name], DO Primary Cardiologist: none  Chief Complaint: afib/s/p lobectomy Reason for Consultation: afib Requesting MD: Genoveva Ill  HPI: Kristopher [name] is a 58 y.o. male with history of tobacco abuse, diabetes, hypertension with recent lung biopsy suggesting non-small cell carcinoma of the lung.  He has no prior cardiac history.  He underwent left thoracotomy with left lower lobectomy.  Postop course has been complicated by atrial fibrillation with rapid ventricular response.  Patient reports no prior cardiac history.  No arrhythmia.  Echocardiogram revealed preserved LV function with no significant valvular normalities.  LVH was present.  High-sensitivity troponin was negative for ischemia.  BNP is normal.  EKG shows atrial fibrillation with rapid ventricular response.  No ischemia.  Chest x-ray revealed surgical changes with pulmonary vascular congestion.  No pneumothorax.  No change in the aeration to the left lung.  Patient feels shortness of breath and chest wall pain.  He denies any sensation of rapid heart rate chest pain or shortness of breath as an outpatient.  Past Medical History:  Diagnosis Date  . Allergy   . Arthritis    left foot  . Benign hypertensive kidney disease   . Chronic back pain    Four rods in back  . Diabetes mellitus, type 2 ([place])   . Dyspnea   . GERD (gastroesophageal reflux disease)   . Hypertension   . Migraines    daily      Surgical History:  Past Surgical History:  Procedure Laterality Date  . APPENDECTOMY    . BACK SURGERY    . COLONOSCOPY WITH PROPOFOL N/A 02/19/2016   Procedure: COLONOSCOPY WITH PROPOFOL;  Surgeon: Lucilla Lame, MD;  Location: [place];  Service: Endoscopy;  Laterality: N/A;  . COLONOSCOPY WITH PROPOFOL N/A 01/19/2018    Procedure: COLONOSCOPY WITH PROPOFOL;  Surgeon: Lucilla Lame, MD;  Location: [facility];  Service: Endoscopy;  Laterality: N/A;  Diabetic - oral meds  . DG OPERATIVE LEFT HIP ([place] HX)     10/19  . ELECTROMAGNETIC NAVIGATION BROCHOSCOPY Left 11/18/2018   Procedure: ELECTROMAGNETIC NAVIGATION BRONCHOSCOPY;  Surgeon: Tyler Pita, MD;  Location: [facility];  Service: Cardiopulmonary;  Laterality: Left;  . FOOT SURGERY Left    Screws and plates  . JOINT REPLACEMENT Left 12/2017   DR Rudene Christians Hip  . KNEE SURGERY Left    X 2  . LEG SURGERY    . POLYPECTOMY N/A 02/19/2016   Procedure: POLYPECTOMY;  Surgeon: Lucilla Lame, MD;  Location: [place];  Service: Endoscopy;  Laterality: N/A;  . POLYPECTOMY  01/19/2018   Procedure: POLYPECTOMY;  Surgeon: Lucilla Lame, MD;  Location: [place];  Service: Endoscopy;;  . THORACOTOMY Left 01/14/2019   Procedure: THORACOTOMY MAJOR, LEFT;  Surgeon: Nestor Lewandowsky, MD;  Location: [facility];  Service: General;  Laterality: Left;  . TOTAL HIP ARTHROPLASTY Left 12/02/2017   Procedure: TOTAL HIP ARTHROPLASTY ANTERIOR APPROACH;  Surgeon: Hessie Knows, MD;  Location: [facility];  Service: Orthopedics;  Laterality: Left;  [name] VIDEO BRONCHOSCOPY Left 01/14/2019   Procedure: VIDEO BRONCHOSCOPY WITH FLUORO, LEFT;  Surgeon: Nestor Lewandowsky, MD;  Location: [facility];  Service: General;  Laterality: Left;     Home Meds: Prior to Admission medications   Medication Sig Start Date End Date Taking? Authorizing Provider  albuterol (PROVENTIL HFA;VENTOLIN  HFA) 108 (90 Base) MCG/ACT inhaler Inhale 2 puffs into the lungs every 6 (six) hours as needed for wheezing or shortness of breath. 05/01/18  Yes Johnson, [name], DO  atorvastatin (LIPITOR) 10 MG tablet TAKE 1 TABLET BY MOUTH EVERY DAY Patient taking differently: Take 10 mg by mouth daily.  10/08/18  Yes Johnson, [name], DO  esomeprazole (NEXIUM) 40 MG capsule Take 1 capsule (40 mg total) by mouth  daily. Patient taking differently: Take 40 mg by mouth daily as needed (acid reflux).  10/01/17  Yes Johnson, [name], DO  Fluticasone Furoate (ARNUITY ELLIPTA) 100 MCG/ACT AEPB Inhale 1 puff into the lungs daily. 11/30/18  Yes Tyler Pita, MD  hydrALAZINE (APRESOLINE) 100 MG tablet Take 1 tablet (100 mg total) by mouth 2 (two) times daily. 09/22/18  Yes Johnson, [name], DO  metFORMIN (GLUCOPHAGE-XR) 500 MG 24 hr tablet TAKE 2 TABLETS (1,000 MG TOTAL) BY MOUTH 2 (TWO) TIMES DAILY WITH A MEAL. Patient taking differently: Take 1,000 mg by mouth 2 (two) times daily with a meal.  12/28/18  Yes Johnson, [name], DO  metoprolol succinate (TOPROL-XL) 100 MG 24 hr tablet 125 mg in the morning, and 125 mg in the evening (to be taken with the 25mg  pill to equal 125mg ) Patient taking differently: Take 100 mg by mouth 2 (two) times daily.  11/28/18  Yes Johnson, [name], DO  metoprolol succinate (TOPROL-XL) 25 MG 24 hr tablet To be taken with the 100mg  for a total of 125mg  BID Patient taking differently: Take 25 mg by mouth 2 (two) times daily.  12/01/18  Yes Johnson, [name], DO  montelukast (SINGULAIR) 10 MG tablet Take 1 tablet (10 mg total) by mouth at bedtime. 09/22/18  Yes Johnson, [name], DO  nicotine (NICODERM CQ) 21 mg/24hr patch Place 1 patch (21 mg total) onto the skin daily. 01/01/19  Yes Volney American, PA-C  nicotine polacrilex ([employer] NICOTINE) 2 MG gum Take 1 each (2 mg total) by mouth as needed for smoking cessation. 01/01/19  Yes Volney American, PA-C  nortriptyline (PAMELOR) 25 MG capsule Take 2 capsules (50 mg total) by mouth at bedtime. 09/22/18  Yes Johnson, [name], DO  Oxycodone HCl 10 MG TABS Take 10 mg by mouth 4 (four) times daily.  11/27/18  Yes [provider]  Potassium Chloride ER 20 MEQ TBCR Take 20 mEq by mouth daily. 01/11/19  Yes Johnson, [name], DO  SUMAtriptan (IMITREX) 100 MG tablet TAKE ONE TABLET AT ONSET OF HEADACHE MAY REPEAT IN TWO HOURS IF  NECESSARY LIMIT OF TWO IN 24 HOURS Patient taking differently: Take 100 mg by mouth every 2 (two) hours as needed for migraine or headache.  12/05/17  Yes Johnson, [name], DO  ACCU-CHEK GUIDE test strip USE TO TEST BLOOD SUGAR 2X A DAY 07/16/18   [provider]    Inpatient Medications:  . atorvastatin  10 mg Oral Daily  . bisacodyl  10 mg Oral Daily  . Chlorhexidine Gluconate Cloth  6 each Topical Daily  . enoxaparin (LOVENOX) injection  40 mg Subcutaneous Q24H  . hydrALAZINE  100 mg Oral BID  . insulin aspart  0-5 Units Subcutaneous QHS  . insulin aspart  0-9 Units Subcutaneous TID WC  . mouth rinse  15 mL Mouth Rinse BID  . metFORMIN  1,000 mg Oral BID WC  . methocarbamol  750 mg Oral QID  . metoprolol succinate  125 mg Oral BID  .  montelukast  10 mg Oral QHS  . nicotine  21 mg Transdermal Daily  . nortriptyline  50 mg Oral QHS  . oxyCODONE  10 mg Oral QID  . pantoprazole  40 mg Oral Daily  . polyethylene glycol  17 g Oral Daily  . sodium phosphate  1 enema Rectal Once   . sodium chloride      Allergies:  Allergies  Allergen Reactions  . Aspirin Anaphylaxis  . Epinephrine Anaphylaxis  . Lidocaine Anaphylaxis  . Novocain [Procaine] Anaphylaxis  . Penicillins Anaphylaxis    Has patient had a PCN reaction causing immediate rash, facial/tongue/throat swelling, SOB or lightheadedness with hypotension: Yes Has patient had a PCN reaction causing severe rash involving mucus membranes or skin necrosis: No Has patient had a PCN reaction that required hospitalization: Yes Has patient had a PCN reaction occurring within the last 10 years: No If all of the above answers are "NO", then may proceed with Cephalosporin use.   . Strawberry Extract Anaphylaxis  . Shellfish Allergy Hives and Nausea And Vomiting    Social History   Socioeconomic History  . Marital status: Significant Other    Spouse name: Not on file  . Number of children: Not on file  . Years of education:  Not on file  . Highest education level: Not on file  Occupational History  . Occupation: disability  Social Needs  . Financial resource strain: Not hard at all  . Food insecurity    Worry: Never true    Inability: Never true  . Transportation needs    Medical: No    Non-medical: No  Tobacco Use  . Smoking status: Former Smoker    Packs/day: 0.00    Years: 35.00    Pack years: 0.00  . Smokeless tobacco: Never Used  Substance and Sexual Activity  . Alcohol use: No    Alcohol/week: 0.0 standard drinks  . Drug use: Yes    Types: Oxycodone    Comment: prescribed  . Sexual activity: Yes  Lifestyle  . Physical activity    Days per week: 0 days    Minutes per session: 0 min  . Stress: Not at all  Relationships  . Social connections    Talks on phone: More than three times a week    Gets together: More than three times a week    Attends religious service: More than 4 times per year    Active member of club or organization: No    Attends meetings of clubs or organizations: Never    Relationship status: Living with partner  . Intimate partner violence    Fear of current or ex partner: No    Emotionally abused: No    Physically abused: No    Forced sexual activity: No  Other Topics Concern  . Not on file  Social History Narrative  . Not on file     Family History  Problem Relation Age of Onset  . Cancer Father   . Diabetes Sister   . Thrombosis Sister      Review of Systems: A 12-system review of systems was performed and is negative except as noted in the HPI.  Labs: No results for input(s): CKTOTAL, CKMB, TROPONINI in the last 72 hours. Lab Results  Component Value Date   WBC 17.6 (H) 01/16/2019   HGB 14.7 01/16/2019   HCT 46.4 01/16/2019   MCV 91.0 01/16/2019   PLT 252 01/16/2019    Recent Labs  Lab 01/16/19 0424  01/16/19 0556  NA 138  --   K 4.2  --   CL 100  --   CO2 29  --   BUN 15  --   CREATININE 0.94  --   CALCIUM 8.9  --   PROT  --  7.9   BILITOT  --  1.2  ALKPHOS  --  103  ALT  --  59*  AST  --  54*  GLUCOSE 156*  --    Lab Results  Component Value Date   CHOL 115 10/06/2018   HDL 32 (L) 10/06/2018   LDLCALC 49 10/06/2018   TRIG 170 (H) 10/06/2018   No results found for: DDIMER  Radiology/Studies:  Dg Chest 1 View  Result Date: 01/16/2019 CLINICAL DATA:  Short of breath. Follow-up chest tube. EXAM: CHEST  1 VIEW COMPARISON:  01/15/2019 FINDINGS: Left-sided chest tube is in place. No significant pneumothorax visualized. Decreased lung volumes. Pulmonary vascular congestion. Atelectasis noted in the left base. IMPRESSION: 1. No significant change in aeration to the left lung. 2. No significant pneumothorax noted. 3. Pulmonary vascular congestion. Electronically Signed   By: Kerby Moors M.D.   On: 01/16/2019 05:42   Dg Chest 2 View  Result Date: 01/15/2019 CLINICAL DATA:  Postoperative EXAM: CHEST - 2 VIEW COMPARISON:  01/14/2019 FINDINGS: No significant change in examination status post left thoracotomy with left-sided chest tubes in position and no significant pneumothorax. Very low volume examination with mild diffuse interstitial opacity, likely bronchovascular crowding. No new airspace opacity. Mild cardiomegaly. Subcutaneous emphysema about the left chest wall. IMPRESSION: 1. No significant change in examination status post left thoracotomy with left-sided chest tubes in position and no significant pneumothorax. 2. Very low volume examination with mild diffuse interstitial opacity, likely bronchovascular crowding. No new airspace opacity. Electronically Signed   By: Eddie Candle M.D.   On: 01/15/2019 09:19   Dg Chest Port 1 View  Result Date: 01/14/2019 CLINICAL DATA:  Postop thoracotomy, left. EXAM: PORTABLE CHEST 1 VIEW COMPARISON:  Preoperative radiograph 12/16/2018, CT 11/17/2018 FINDINGS: Two left-sided chest tubes in place, 1 directed towards the apex, 1 towards the base. No definite visualized  pneumothorax. Subcutaneous emphysema about the left lateral chest wall with adjacent skin staples. Lung volumes are low. Upper normal heart size, accentuated by low lung volumes. Bronchovascular crowding without pulmonary edema. No focal abnormality in the right lung. IMPRESSION: 1. Two left-sided chest tubes in place. No definite pneumothorax. Subcutaneous emphysema about the left lateral chest wall. 2. Low lung volumes with bronchovascular crowding. Electronically Signed   By: Keith Rake M.D.   On: 01/14/2019 17:59   Dg Abd Portable 2v  Result Date: 01/16/2019 CLINICAL DATA:  Short of breath. Chest tube. EXAM: PORTABLE ABDOMEN - 2 VIEW COMPARISON:  10/30/2017 FINDINGS: Moderate distension of the stomach. There is diffuse gaseous distension of the colon with a moderate stool burden identified within the right colon and descending colon. No significant small bowel dilatation identified. Previous left hip arthroplasty and anterior disc fusion of the lower lumbar spine. IMPRESSION: 1. Moderate gaseous distension of the, nonspecific. This may reflect colonic ileus versus distal bowel obstruction. Clinical correlation advised. 2. Moderate stool burden within the right colon and descending colon. Electronically Signed   By: Kerby Moors M.D.   On: 01/16/2019 05:44    Wt Readings from Last 3 Encounters:  01/14/19 106.4 kg  01/08/19 106.4 kg  12/25/18 105.1 kg    EKG: Atrial fibrillation with rapid ventricular response  Physical  Exam:  Blood pressure (!) 146/106, pulse (!) 133, temperature 99.2 F (37.3 C), temperature source Axillary, resp. rate (!) 22, height 5\' 10"  (1.778 m), weight 106.4 kg, SpO2 100 %. Body mass index is 33.65 kg/m. General: Well developed, well nourished, in no acute distress. Head: Normocephalic, atraumatic, sclera non-icteric, no xanthomas, nares are without discharge.  Neck: Negative for carotid bruits. JVD not elevated. Lungs: Decreased breath sounds on the left.   Right normal. Heart: Irregular regular rhythm Abdomen: Soft, non-tender, non-distended with normoactive bowel sounds. No hepatomegaly. No rebound/guarding. No obvious abdominal masses. Msk:  Strength and tone appear normal for age. Extremities: No clubbing or cyanosis. No edema.  Distal pedal pulses are 2+ and equal bilaterally. Neuro: Alert and oriented X 3. No facial asymmetry. No focal deficit. Moves all extremities spontaneously. Psych:  Responds to questions appropriately with a normal affect.     Assessment and Plan  58 year old male with A. fib postop left lower lobectomy for probable squamous cell carcinoma.  Hemodynamically stable.  Rate still fairly rapid.  Received a bolus of amiodarone but no drip.  Currently on 125 mg of metoprolol succinate.  He also just received a dose of 5 mg of IV Lopressor x2.  Rate remains in the 115-150. We will continue to follow the rate after the previously mentioned dosing.  Consideration for at least transient IV amiodarone to control rate and help for converted back to sinus rhythm.  Not a candidate for anticoagulation at present given recent surgical procedure.  Signed, Teodoro Spray MD 01/16/2019, 11:22 AM Pager: [phone]

## 2019-01-16 NOTE — Progress Notes (Signed)
BRIEF OVERNIGHT REPORT  SUBJECTIVE: Rapid response initiated to increased work of breathing, hypoxia and tachycardia with rates in the 140s and 150s.  OBJECTIVE: On arrival to the ED, he was afebrile with blood pressure 169/119 mm Hg and pulse rate 166 beats/min. There were no focal neurological deficits; he was alert and oriented x4. Increased work of breathing, diffuse rhonchi and crackles in all lung fields, breath sounds diminished on left with faint expiratory wheezes throughout,1 chest tube along left paravertebral space and 1 chest tube at the left apex of chest with erosanguineous drainage   ASSESSMENT: 58 yo male with a PMH Migraines, Former Tobacco Abuse, HTN, GERD, Dyspnea, Type II Diabetes Mellitus, Chronic Back Pain, Benign Hypertensive Kidney Disease, Arthritis, and Allergies. He presented to [facility] on [facility]/12 to undergo a bronchoscopy, left thoracotomy with left lower lobectomy, and insertion of 2 left sided chest tubes due to non-small cell carcinoma of left lung favoring squamous cell.  PLAN 1. New onset Atrial Fibrillation with RVR - Transfer to ICU - EKG+Telemetry - Troponins - TSH, FT4 - check Repeat CXR  - Labs CBC, BMP, BNP, Lactic and procalcitonin - TTEcho - Consult placed to critical care team - Consider cardiology consult  2. Acute respiratory failure with hypoxia S/p  left thoracotomy with left lower lobectomy, and insertion of 2 left sided chest tubes due t - Check chest x-ray as above and KUB    Rufina Falco, DNP, CCRN, FNP-C [facility] Nurse Practitioner Between 7pm to [place] - Pager [phone] Actively using [employer] secure chat messaging  After 7am go to [email or web address] - [email or web address]  [facility] [employer]  [phone]

## 2019-01-16 NOTE — Progress Notes (Signed)
Patient was having shortness of breath and severe pain. Morphine was given pain wouldn't subside. Patient heart rate was in the 150's teley called stating BBB. MD called orders placed patient was sent to ICU and patients wife was informed.

## 2019-01-16 NOTE — Progress Notes (Signed)
Dr Celine Ahr notified pt transferring to CCU. No orders given

## 2019-01-16 NOTE — Progress Notes (Signed)
This is a 58 year old patient of Dr. Genevive Bi', status post left thoracotomy and left lower lobectomy for lung cancer.  He was transferred to the floor yesterday, but early this morning, developed a atrial fibrillation with rapid ventricular response.  He endorsed dyspnea and was transferred to the intensive care unit for closer monitoring and evaluation.  He is having an echocardiogram at the time of my evaluation.  No results are available at this time.  A chest x-ray was performed that did not show any large pleural effusion or any pneumothorax.  Output from the chest tube has been serosanguineous.  This morning, he states that he feels better than he did when he was transferred to the unit.  He still feels short of breath.  He is on a nonrebreather.  Today's Vitals   01/16/19 0600 01/16/19 0700 01/16/19 0800 01/16/19 1000  BP: (!) 145/121 (!) 135/96 133/89 (!) 146/106  Pulse: 62 (!) 137 85 (!) 133  Resp: (!) 22  (!) 22   Temp:   99.2 F (37.3 C)   TempSrc:   Axillary   SpO2: 100% 100% 100%   Weight:      Height:      PainSc:       Body mass index is 33.65 kg/m. He appears uncomfortable.  He does not have significant increased work of breathing, however. Telemetry shows atrial fibrillation with rapid ventricular response. Abdomen is distended, but the patient states that this is normal for him. Surgical sites appear clean and intact without concern for infection.  No air leak from chest tube.  Impression and plan: This is a 58 year old man who underwent a left thoracotomy for a lower lung mass.  He has developed A. fib with RVR.  Surgery appreciates the assistance of critical care medicine in managing this complication.  From a surgical standpoint, he does not have a pneumothorax or pleural effusion.  We will continue to monitor his chest tubes and chest tube output.  We had planned to place him on waterseal today, but in light of this current issue, we will continue him on suction for now.

## 2019-01-16 NOTE — Consult Note (Signed)
Name: Kristopher [name] MRN: [id number] DOB: Feb 13, 1961    ADMISSION DATE:  01/14/2019 CONSULTATION DATE: 01/14/2019  REFERRING MD : Dr. Genevive Bi   CHIEF COMPLAINT: LLL lung mass   BRIEF PATIENT DESCRIPTION:  58 yo male admitted with LLL lung mass s/p bronchoscopy and left thoracotomy with left lower lobectomy   SIGNIFICANT EVENTS/STUDIES:  11/12: Pt admitted to ICU post left thoracotomy with left lower lobectomy  11/13/202: Transferred out of the ICU 11/14.2020: Bounced back due to Afib with RVR, worsening left chest pain and acute pulmonary edema  HISTORY OF PRESENT ILLNESS:   This is a 58 yo male with a PMH Migraines, Former Tobacco Abuse, HTN, GERD, Dyspnea, Type II Diabetes Mellitus, Chronic Back Pain, Benign Hypertensive Kidney Disease, Arthritis, and Allergies. He presented to [facility] on 11/12 to undergo a bronchoscopy, left thoracotomy with left lower lobectomy, and insertion of 2 left sided chest tubes due to non-small cell carcinoma of left lung favoring squamous cell.  He was subsequently admitted to ICU postop for additional workup and treatment. Was stabilized and transferred out of the ICU. A rapid response called this morning for worsening dyspnea, left chest pain and afib with RVR .  His heart rate is sustaining in the 140s and 150s despite 5 mg of metoprolol and 150 mg of amiodarone.  He does not have a history of atrial fibrillation.  He denies chest pain, but reports worsening left-sided chest wall pain mostly at the chest tube insertion site.  His abdomen is very distended but he states that that is his baseline.  He reports irregular bowel movement since hospitalization.  PAST MEDICAL HISTORY :   has a past medical history of Allergy, Arthritis, Benign hypertensive kidney disease, Chronic back pain, Diabetes mellitus, type 2 ([place]), Dyspnea, GERD (gastroesophageal reflux disease), Hypertension, and Migraines.  has a past surgical history that includes Back surgery;  Appendectomy; Leg Surgery; Knee surgery (Left); Foot surgery (Left); Colonoscopy with propofol (N/A, 02/19/2016); polypectomy (N/A, 02/19/2016); Total hip arthroplasty (Left, 12/02/2017); Colonoscopy with propofol (N/A, 01/19/2018); polypectomy (01/19/2018); DG OPERATIVE LEFT HIP ([place] [facility]); Joint replacement (Left, 12/2017); Electormagnetic navigation bronchoscopy (Left, 11/18/2018); Video bronchoscopy (Left, 01/14/2019); and Thoracotomy (Left, 01/14/2019). Prior to Admission medications   Medication Sig Start Date End Date Taking? Authorizing Provider  albuterol (PROVENTIL HFA;VENTOLIN HFA) 108 (90 Base) MCG/ACT inhaler Inhale 2 puffs into the lungs every 6 (six) hours as needed for wheezing or shortness of breath. 05/01/18  Yes Johnson, [name], DO  atorvastatin (LIPITOR) 10 MG tablet TAKE 1 TABLET BY MOUTH EVERY DAY Patient taking differently: Take 10 mg by mouth daily.  10/08/18  Yes Johnson, [name], DO  esomeprazole (NEXIUM) 40 MG capsule Take 1 capsule (40 mg total) by mouth daily. Patient taking differently: Take 40 mg by mouth daily as needed (acid reflux).  10/01/17  Yes Johnson, [name], DO  Fluticasone Furoate (ARNUITY ELLIPTA) 100 MCG/ACT AEPB Inhale 1 puff into the lungs daily. 11/30/18  Yes Tyler Pita, MD  hydrALAZINE (APRESOLINE) 100 MG tablet Take 1 tablet (100 mg total) by mouth 2 (two) times daily. 09/22/18  Yes Johnson, [name], DO  metFORMIN (GLUCOPHAGE-XR) 500 MG 24 hr tablet TAKE 2 TABLETS (1,000 MG TOTAL) BY MOUTH 2 (TWO) TIMES DAILY WITH A MEAL. Patient taking differently: Take 1,000 mg by mouth 2 (two) times daily with a meal.  12/28/18  Yes Johnson, [name], DO  metoprolol succinate (TOPROL-XL) 100 MG 24 hr tablet 125 mg in the morning, and 125  mg in the evening (to be taken with the 25mg  pill to equal 125mg ) Patient taking differently: Take 100 mg by mouth 2 (two) times daily.  11/28/18  Yes Johnson, [name], DO  metoprolol succinate (TOPROL-XL) 25 MG 24 hr tablet To be taken  with the 100mg  for a total of 125mg  BID Patient taking differently: Take 25 mg by mouth 2 (two) times daily.  12/01/18  Yes Johnson, [name], DO  montelukast (SINGULAIR) 10 MG tablet Take 1 tablet (10 mg total) by mouth at bedtime. 09/22/18  Yes Johnson, [name], DO  nicotine (NICODERM CQ) 21 mg/24hr patch Place 1 patch (21 mg total) onto the skin daily. 01/01/19  Yes Volney American, PA-C  nicotine polacrilex ([employer] NICOTINE) 2 MG gum Take 1 each (2 mg total) by mouth as needed for smoking cessation. 01/01/19  Yes Volney American, PA-C  nortriptyline (PAMELOR) 25 MG capsule Take 2 capsules (50 mg total) by mouth at bedtime. 09/22/18  Yes Johnson, [name], DO  Oxycodone HCl 10 MG TABS Take 10 mg by mouth 4 (four) times daily.  11/27/18  Yes [provider]  Potassium Chloride ER 20 MEQ TBCR Take 20 mEq by mouth daily. 01/11/19  Yes Johnson, [name], DO  SUMAtriptan (IMITREX) 100 MG tablet TAKE ONE TABLET AT ONSET OF HEADACHE MAY REPEAT IN TWO HOURS IF NECESSARY LIMIT OF TWO IN 24 HOURS Patient taking differently: Take 100 mg by mouth every 2 (two) hours as needed for migraine or headache.  12/05/17  Yes Johnson, [name], DO  ACCU-CHEK GUIDE test strip USE TO TEST BLOOD SUGAR 2X A DAY 07/16/18   [provider]   Allergies  Allergen Reactions  . Aspirin Anaphylaxis  . Epinephrine Anaphylaxis  . Lidocaine Anaphylaxis  . Novocain [Procaine] Anaphylaxis  . Penicillins Anaphylaxis    Has patient had a PCN reaction causing immediate rash, facial/tongue/throat swelling, SOB or lightheadedness with hypotension: Yes Has patient had a PCN reaction causing severe rash involving mucus membranes or skin necrosis: No Has patient had a PCN reaction that required hospitalization: Yes Has patient had a PCN reaction occurring within the last 10 years: No If all of the above answers are "NO", then may proceed with Cephalosporin use.   . Strawberry Extract Anaphylaxis  . Shellfish  Allergy Hives and Nausea And Vomiting    FAMILY HISTORY:  family history includes Cancer in his father; Diabetes in his sister; Thrombosis in his sister. SOCIAL HISTORY:  reports that he has quit smoking. He smoked 0.00 packs per day for 35.00 years. He has never used smokeless tobacco. He reports current drug use. Drug: Oxycodone. He reports that he does not drink alcohol.  REVIEW OF SYSTEMS: Positives in BOLD  Constitutional: Negative for fever, chills, weight loss, malaise/fatigue and diaphoresis.  HENT: Negative for hearing loss, ear pain, nosebleeds, congestion, sore throat, neck pain, tinnitus and ear discharge.   Eyes: Negative for blurred vision, double vision, photophobia, pain, discharge and redness.  Respiratory: Positive for left sided chest pain at chest tube sites, cough, sputum production, and shortness of breath Cardiovascular: Negative for chest pain,orthopnea, claudication, leg swelling and PND but positive for palpitation.  Gastrointestinal: Negative for heartburn, nausea, vomiting, abdominal pain, diarrhea but positive for constipation Genitourinary: Negative for dysuria, urgency, frequency, hematuria and flank pain.  Musculoskeletal: Negative for myalgias, back pain, joint pain and falls.  Skin: Negative for itching and rash.  Neurological: Negative for dizziness, tingling, tremors, sensory change, speech change, focal  weakness, seizures, loss of consciousness, weakness and headaches.  Endo/Heme/Allergies: Negative for environmental allergies and polydipsia. Does not bruise/bleed easily.  VITAL SIGNS: Temp:  [97.6 F (36.4 C)-98.9 F (37.2 C)] 97.7 F (36.5 C) (11/14 0406) Pulse Rate:  [79-161] 120 (11/14 0416) Resp:  [15-28] 20 (11/14 0327) BP: (131-175)/(94-135) 153/123 (11/14 0416) SpO2:  [92 %-100 %] 97 % (11/14 0350) FiO2 (%):  [100 %] 100 % (11/14 0350)  PHYSICAL EXAMINATION: General: well developed, well nourished male, in moderate respiratory  distress Neuro: alert and oriented, follows commands  HEENT: supple, no JVD  Cardiovascular: Apical pulse irregular, tachycardic, with rates in the 140s and 150s, S1-S2, no murmur regurg or gallop, +2 pulses bilaterally, no edema Lungs: Increased work of breathing, diffuse rhonchi and crackles in all lung fields, breath sounds diminished on left with faint expiratory wheezes throughout,1 chest tube along left paravertebral space and 1 chest tube at the left apex of chest with serosanguineous drainage  Abdomen: Distended, hypoactive bowel sounds in all 4 quadrants, palpation reveals no organomegaly Musculoskeletal: normal bulk and tone, no edema  Skin: surgical incision at left paravertebral space and left apex of chest   Recent Labs  Lab 01/14/19 2021 01/15/19 0306  NA 137 137  K 4.4 4.3  CL 102 102  CO2 25 26  BUN 14 14  CREATININE 0.97 0.97  GLUCOSE 217* 193*   Recent Labs  Lab 01/14/19 1847 01/15/19 0306 01/16/19 0424  HGB 13.7 13.2 14.7  HCT 41.4 39.7 46.4  WBC 17.4* 13.7* 17.6*  PLT 218 203 252   Dg Chest 2 View  Result Date: 01/15/2019 CLINICAL DATA:  Postoperative EXAM: CHEST - 2 VIEW COMPARISON:  01/14/2019 FINDINGS: No significant change in examination status post left thoracotomy with left-sided chest tubes in position and no significant pneumothorax. Very low volume examination with mild diffuse interstitial opacity, likely bronchovascular crowding. No new airspace opacity. Mild cardiomegaly. Subcutaneous emphysema about the left chest wall. IMPRESSION: 1. No significant change in examination status post left thoracotomy with left-sided chest tubes in position and no significant pneumothorax. 2. Very low volume examination with mild diffuse interstitial opacity, likely bronchovascular crowding. No new airspace opacity. Electronically Signed   By: Eddie Candle M.D.   On: 01/15/2019 09:19   Dg Chest Port 1 View  Result Date: 01/14/2019 CLINICAL DATA:  Postop  thoracotomy, left. EXAM: PORTABLE CHEST 1 VIEW COMPARISON:  Preoperative radiograph 12/16/2018, CT 11/17/2018 FINDINGS: Two left-sided chest tubes in place, 1 directed towards the apex, 1 towards the base. No definite visualized pneumothorax. Subcutaneous emphysema about the left lateral chest wall with adjacent skin staples. Lung volumes are low. Upper normal heart size, accentuated by low lung volumes. Bronchovascular crowding without pulmonary edema. No focal abnormality in the right lung. IMPRESSION: 1. Two left-sided chest tubes in place. No definite pneumothorax. Subcutaneous emphysema about the left lateral chest wall. 2. Low lung volumes with bronchovascular crowding. Electronically Signed   By: Keith Rake M.D.   On: 01/14/2019 17:59    ASSESSMENT / PLAN: Atrial fibrillation with rapid ventricular rate -Stat EKG and cardiac enzymes -Amiodarone 150 mg IV x1 and continue with as needed beta-blocker -2D echo -Cardiology consult  Ileus and constipation -Lactulose prescribed but patient is unable to tolerate it -Fleet enema x1 -MiraLAX 17 g p.o. daily  Mild pulmonary edema -Lasix 40 mg IV x1 -Monitor for worsening respiratory status  Leukocytosis-Likely reactionary from surgery -Trend procalcitonin and consider empiric antibiotics coverage if persistently elevated -Monitor fever curve -  Panculture if febrile  LLL lung mass s/p bronchoscopy and left thoracotomy with left lower lobectomy  Postop pain  -Supplemental O2 via nonrebreather mask for dyspnea and/or hypoxia -Prn bronchodilator therapy  -Left chest tubes to suction @20  cm  -Monitor chest tube drainage output -Prn tramadol, morphine, and oxycodone for pain management -Aggressive pulmonary hygiene  -Start chest x-ray reviewed  Uncontrolled hypertension  -Continuous telemetry monitoring  -Continue outpatient cardiac medications  -Metoprolol and hydralazine as needed to keep blood pressure and heart rate within  parameters  GERD -Continue protonix   Type II diabetes mellitus  -Continue outpatient metformin  -CBG's q4hrs  Magdalene S. Tukov-Yual, ANP-BC Pulmonary and [place] Pager [phone] or [phone]  NB: This document was prepared using Dragon voice recognition software and may include unintentional dictation errors.

## 2019-01-16 NOTE — Progress Notes (Addendum)
HR was in the 150s this morning, MD notified.  2 doses of PRN medicine given, then patient took his regular medications.  HR improved over the shift and is within normal limits now (86) with BP also much better (124/85).  RN gave oral laxatives this morning.  Patient reported not having eaten in days, so he is not worried that he has not pooped.  He reported some gas.  Patient refused miralax and the enema.  He said he will wait for the oral laxative to work.  Patient reports metal in his back, leg(s), and hip.  He stated he cannot roll.  RN & assistant very carefully washed him & changed his sheets while patient lifted one part of his body at a time.  RN spoke with his wife and multiple family members.   Patient is resting now.  Pain management is his goal for today.  Patient continues to be on 15 L non-rebreather mask.  Phillis Knack, RN

## 2019-01-16 NOTE — Progress Notes (Signed)
PROGRESS NOTE    Kristopher [name]  WHQ:[id number] DOB: 1960-07-18 DOA: 01/14/2019 PCP: [name], DO   Brief Narrative:  This is a 58 year old man who underwent a left thoracotomy for a lower lung mass.  He has developed A. fib with RVR overnight.  Critical care medicine consulted for assistance managing this complication.  From a surgical standpoint, he does not have a pneumothorax or pleural effusion, they are monitoring his chest tubes and chest tube output and because of his complications overnight they will continue him on suction for now.   Assessment & Plan:   Active Problems:   Lung mass   Atrial fibrillation with RVR (HCC)   Shortness of breath   Acute respiratory failure with hypoxia (HCC)   Uncontrolled hypertension Continue current blood pressure medications Now on amiodarone drip Received Lasix x1 On metoprolol 125 twice daily  Diabetes mellitus type 2, controlled On metformin which is continued.   Continue on sliding scale coverage. Blood glucose AC at bedtime  Non small cell lung ca  recently diagnosed. S/p  bronchoscopy, left thoracotomy and left lower lobe lobectomy with insertion of 2 left-sided chest tubes. Management per primary. Quit smoking 3 weeks back. conitnue nicotine patch.  dyslipidemia  on statin   DVT prophylaxis: Lovenox SQ  Code Status: full    Code Status Orders  (From admission, onward)         Start     Ordered   01/14/19 2119  Full code  Continuous     01/14/19 2118        Code Status History    Date Active Date Inactive Code Status Order ID Comments User Context   12/02/2017 1426 12/04/2017 1538 Full Code [id number]  Hessie Knows, MD Inpatient   08/14/2017 0059 08/14/2017 1933 Full Code [id number]  Lance Coon, MD ED   Advance Care Planning Activity     Family Communication: Discussed with patient today defer further discussion to primary Disposition Plan:   Per primary Consults called: Critical care team  overnight Admission status: Inpatient   Consultants:   gen surg, [facility]  Procedures:  Dg Chest 1 View  Result Date: 01/16/2019 CLINICAL DATA:  Short of breath. Follow-up chest tube. EXAM: CHEST  1 VIEW COMPARISON:  01/15/2019 FINDINGS: Left-sided chest tube is in place. No significant pneumothorax visualized. Decreased lung volumes. Pulmonary vascular congestion. Atelectasis noted in the left base. IMPRESSION: 1. No significant change in aeration to the left lung. 2. No significant pneumothorax noted. 3. Pulmonary vascular congestion. Electronically Signed   By: Kerby Moors M.D.   On: 01/16/2019 05:42   Dg Chest 2 View  Result Date: 01/15/2019 CLINICAL DATA:  Postoperative EXAM: CHEST - 2 VIEW COMPARISON:  01/14/2019 FINDINGS: No significant change in examination status post left thoracotomy with left-sided chest tubes in position and no significant pneumothorax. Very low volume examination with mild diffuse interstitial opacity, likely bronchovascular crowding. No new airspace opacity. Mild cardiomegaly. Subcutaneous emphysema about the left chest wall. IMPRESSION: 1. No significant change in examination status post left thoracotomy with left-sided chest tubes in position and no significant pneumothorax. 2. Very low volume examination with mild diffuse interstitial opacity, likely bronchovascular crowding. No new airspace opacity. Electronically Signed   By: Eddie Candle M.D.   On: 01/15/2019 09:19   Dg Chest Port 1 View  Result Date: 01/14/2019 CLINICAL DATA:  Postop thoracotomy, left. EXAM: PORTABLE CHEST 1 VIEW COMPARISON:  Preoperative radiograph 12/16/2018, CT 11/17/2018 FINDINGS: Two left-sided chest tubes in  place, 1 directed towards the apex, 1 towards the base. No definite visualized pneumothorax. Subcutaneous emphysema about the left lateral chest wall with adjacent skin staples. Lung volumes are low. Upper normal heart size, accentuated by low lung volumes. Bronchovascular crowding  without pulmonary edema. No focal abnormality in the right lung. IMPRESSION: 1. Two left-sided chest tubes in place. No definite pneumothorax. Subcutaneous emphysema about the left lateral chest wall. 2. Low lung volumes with bronchovascular crowding. Electronically Signed   By: Keith Rake M.D.   On: 01/14/2019 17:59   Dg Abd Portable 2v  Result Date: 01/16/2019 CLINICAL DATA:  Short of breath. Chest tube. EXAM: PORTABLE ABDOMEN - 2 VIEW COMPARISON:  10/30/2017 FINDINGS: Moderate distension of the stomach. There is diffuse gaseous distension of the colon with a moderate stool burden identified within the right colon and descending colon. No significant small bowel dilatation identified. Previous left hip arthroplasty and anterior disc fusion of the lower lumbar spine. IMPRESSION: 1. Moderate gaseous distension of the, nonspecific. This may reflect colonic ileus versus distal bowel obstruction. Clinical correlation advised. 2. Moderate stool burden within the right colon and descending colon. Electronically Signed   By: Kerby Moors M.D.   On: 01/16/2019 05:44     Antimicrobials:   None currently   Subjective: Patient flipped into A. fib RVR overnight Critical care is involved in management Cardiology consulted also Now on amiodarone drip improved rate control  Objective: Vitals:   01/16/19 1010 01/16/19 1100 01/16/19 1400 01/16/19 1500  BP:  (!) 148/127 124/85   Pulse: (!) 132 (!) 112 85 88  Resp: (!) 26  (!) 22 15  Temp:   99.3 F (37.4 C)   TempSrc:   Axillary   SpO2: 99% 99% 100% 98%  Weight:      Height:        Intake/Output Summary (Last 24 hours) at 01/16/2019 1649 Last data filed at 01/16/2019 1500 Gross per 24 hour  Intake 686.11 ml  Output 2290 ml  Net -1603.89 ml   Filed Weights   01/14/19 1112  Weight: 106.4 kg    Examination:  General exam: Appears calm and comfortable  Respiratory system: Rhonchi bilaterally Cardiovascular system: Irregularly  irregular with rapid rate this morning now improved  gastrointestinal system: Abdomen is nondistended, soft and nontender. No organomegaly or masses felt. Normal bowel sounds heard. Central nervous system: Alert and oriented. No focal neurological deficits. Extremities: Limited range of motion secondary to pain does move all 4 extremities freely Skin: Chest tubes in place without evidence of infection no rashes, lesions or ulcers Psychiatry: Judgement and insight appear normal. Mood & affect appropriate.     Data Reviewed: I have personally reviewed following labs and imaging studies  CBC: Recent Labs  Lab 01/14/19 1847 01/15/19 0306 01/16/19 0424  WBC 17.4* 13.7* 17.6*  NEUTROABS  --  11.6* 12.3*  HGB 13.7 13.2 14.7  HCT 41.4 39.7 46.4  MCV 88.3 88.8 91.0  PLT 218 203 149   Basic Metabolic Panel: Recent Labs  Lab 01/14/19 2021 01/15/19 0306 01/16/19 0424 01/16/19 0556  NA 137 137 138  --   K 4.4 4.3 4.2  --   CL 102 102 100  --   CO2 25 26 29   --   GLUCOSE 217* 193* 156*  --   BUN 14 14 15   --   CREATININE 0.97 0.97 0.94  --   CALCIUM 8.4* 8.3* 8.9  --   MG  --  1.9  --  2.1  PHOS  --   --   --  3.2   GFR: Estimated Creatinine Clearance: 107.2 mL/min (by C-G formula based on SCr of 0.94 mg/dL). Liver Function Tests: Recent Labs  Lab 01/16/19 0556  AST 54*  ALT 59*  ALKPHOS 103  BILITOT 1.2  PROT 7.9  ALBUMIN 4.3   No results for input(s): LIPASE, AMYLASE in the last 168 hours. No results for input(s): AMMONIA in the last 168 hours. Coagulation Profile: No results for input(s): INR, PROTIME in the last 168 hours. Cardiac Enzymes: No results for input(s): CKTOTAL, CKMB, CKMBINDEX, TROPONINI in the last 168 hours. BNP (last 3 results) No results for input(s): PROBNP in the last 8760 hours. HbA1C: Recent Labs    01/15/19 0306 01/16/19 0424  HGBA1C 7.4* 7.5*   CBG: Recent Labs  Lab 01/15/19 1138 01/15/19 1641 01/15/19 2111 01/16/19 0729  01/16/19 1139  GLUCAP 152* 160* 138* 161* 145*   Lipid Profile: No results for input(s): CHOL, HDL, LDLCALC, TRIG, CHOLHDL, LDLDIRECT in the last 72 hours. Thyroid Function Tests: No results for input(s): TSH, T4TOTAL, FREET4, T3FREE, THYROIDAB in the last 72 hours. Anemia Panel: No results for input(s): VITAMINB12, FOLATE, FERRITIN, TIBC, IRON, RETICCTPCT in the last 72 hours. Sepsis Labs: Recent Labs  Lab 01/16/19 0424 01/16/19 0711 01/16/19 1000  PROCALCITON 0.32  --   --   LATICACIDVEN  --  2.1* 1.7    Recent Results (from the past 240 hour(s))  SARS CORONAVIRUS 2 (TAT 6-24 HRS) Nasopharyngeal Nasopharyngeal Swab     Status: None   Collection Time: 01/11/19 10:19 AM   Specimen: Nasopharyngeal Swab  Result Value Ref Range Status   SARS Coronavirus 2 NEGATIVE NEGATIVE Final    Comment: (NOTE) SARS-CoV-2 target nucleic acids are NOT DETECTED. The SARS-CoV-2 RNA is generally detectable in upper and lower respiratory specimens during the acute phase of infection. Negative results do not preclude SARS-CoV-2 infection, do not rule out co-infections with other pathogens, and should not be used as the sole basis for treatment or other patient management decisions. Negative results must be combined with clinical observations, patient history, and epidemiological information. The expected result is Negative. Fact Sheet for Patients: SugarRoll.be Fact Sheet for Healthcare Providers: https://www.woods-mathews.com/ This test is not yet approved or cleared by the [place] FDA and  has been authorized for detection and/or diagnosis of SARS-CoV-2 by FDA under an Emergency Use Authorization (EUA). This EUA will remain  in effect (meaning this test can be used) for the duration of the COVID-19 declaration under Section 56 4(b)(1) of the Act, 21 U.S.C. section 360bbb-3(b)(1), unless the authorization is terminated or revoked  sooner. Performed at [facility], [place] [address], [place], [place] [address]   MRSA PCR Screening     Status: None   Collection Time: 01/14/19  9:50 PM   Specimen: Nasal Mucosa; Nasopharyngeal  Result Value Ref Range Status   MRSA by PCR NEGATIVE NEGATIVE Final    Comment:        The GeneXpert MRSA Assay (FDA approved for NASAL specimens only), is one component of a comprehensive MRSA colonization surveillance program. It is not intended to diagnose MRSA infection nor to guide or monitor treatment for MRSA infections. Performed at [facility], [address], [place], [place] [address]          Radiology Studies: Dg Chest 1 View  Result Date: 01/16/2019 CLINICAL DATA:  Short of breath. Follow-up chest tube. EXAM: CHEST  1  VIEW COMPARISON:  01/15/2019 FINDINGS: Left-sided chest tube is in place. No significant pneumothorax visualized. Decreased lung volumes. Pulmonary vascular congestion. Atelectasis noted in the left base. IMPRESSION: 1. No significant change in aeration to the left lung. 2. No significant pneumothorax noted. 3. Pulmonary vascular congestion. Electronically Signed   By: Kerby Moors M.D.   On: 01/16/2019 05:42   Dg Chest 2 View  Result Date: 01/15/2019 CLINICAL DATA:  Postoperative EXAM: CHEST - 2 VIEW COMPARISON:  01/14/2019 FINDINGS: No significant change in examination status post left thoracotomy with left-sided chest tubes in position and no significant pneumothorax. Very low volume examination with mild diffuse interstitial opacity, likely bronchovascular crowding. No new airspace opacity. Mild cardiomegaly. Subcutaneous emphysema about the left chest wall. IMPRESSION: 1. No significant change in examination status post left thoracotomy with left-sided chest tubes in position and no significant pneumothorax. 2. Very low volume examination with mild diffuse interstitial opacity, likely bronchovascular crowding. No new airspace opacity.  Electronically Signed   By: Eddie Candle M.D.   On: 01/15/2019 09:19   Dg Chest Port 1 View  Result Date: 01/14/2019 CLINICAL DATA:  Postop thoracotomy, left. EXAM: PORTABLE CHEST 1 VIEW COMPARISON:  Preoperative radiograph 12/16/2018, CT 11/17/2018 FINDINGS: Two left-sided chest tubes in place, 1 directed towards the apex, 1 towards the base. No definite visualized pneumothorax. Subcutaneous emphysema about the left lateral chest wall with adjacent skin staples. Lung volumes are low. Upper normal heart size, accentuated by low lung volumes. Bronchovascular crowding without pulmonary edema. No focal abnormality in the right lung. IMPRESSION: 1. Two left-sided chest tubes in place. No definite pneumothorax. Subcutaneous emphysema about the left lateral chest wall. 2. Low lung volumes with bronchovascular crowding. Electronically Signed   By: Keith Rake M.D.   On: 01/14/2019 17:59   Dg Abd Portable 2v  Result Date: 01/16/2019 CLINICAL DATA:  Short of breath. Chest tube. EXAM: PORTABLE ABDOMEN - 2 VIEW COMPARISON:  10/30/2017 FINDINGS: Moderate distension of the stomach. There is diffuse gaseous distension of the colon with a moderate stool burden identified within the right colon and descending colon. No significant small bowel dilatation identified. Previous left hip arthroplasty and anterior disc fusion of the lower lumbar spine. IMPRESSION: 1. Moderate gaseous distension of the, nonspecific. This may reflect colonic ileus versus distal bowel obstruction. Clinical correlation advised. 2. Moderate stool burden within the right colon and descending colon. Electronically Signed   By: Kerby Moors M.D.   On: 01/16/2019 05:44        Scheduled Meds: . atorvastatin  10 mg Oral Daily  . bisacodyl  10 mg Oral Daily  . Chlorhexidine Gluconate Cloth  6 each Topical Daily  . enoxaparin (LOVENOX) injection  40 mg Subcutaneous Q24H  . hydrALAZINE  100 mg Oral BID  . insulin aspart  0-5 Units  Subcutaneous QHS  . insulin aspart  0-9 Units Subcutaneous TID WC  . mouth rinse  15 mL Mouth Rinse BID  . metFORMIN  1,000 mg Oral BID WC  . methocarbamol  750 mg Oral QID  . metoprolol succinate  125 mg Oral BID  . montelukast  10 mg Oral QHS  . nicotine  21 mg Transdermal Daily  . nortriptyline  50 mg Oral QHS  . oxyCODONE  10 mg Oral QID  . pantoprazole  40 mg Oral Daily  . polyethylene glycol  17 g Oral Daily  . sodium phosphate  1 enema Rectal Once   Continuous Infusions: . sodium chloride  LOS: 2 days    Time spent: [place], MD [facility]  If 7PM-7AM, please contact night-coverage  01/16/2019, 4:49 PM

## 2019-01-16 NOTE — Progress Notes (Signed)
  [place]  Telephone:([phone]) [phone] [phone]) [phone]  ID: [name] OB: [birth year]  MR#: [id number]  BJX#:[id number]  Patient Care Team: [name], DO as PCP - General (Family Medicine) Gavin Pound, CMA (Inactive) as Certified Medical Assistant Guadalupe Maple, MD as Attending Physician (Family Medicine) De Hollingshead, [facility] as Pharmacist (Pharmacist) Minor, Dalbert Garnet, RN as [facility] [place], [place], RN as Registered Nurse   [name], MD   01/16/2019 7:42 AM     This encounter was created in error - please disregard.

## 2019-01-17 ENCOUNTER — Inpatient Hospital Stay: Payer: Medicare Other

## 2019-01-17 LAB — CBC
HCT: 43.2 % (ref 39.0–52.0)
Hemoglobin: 13.6 g/dL (ref 13.0–17.0)
MCH: 29.1 pg (ref 26.0–34.0)
MCHC: 31.5 g/dL (ref 30.0–36.0)
MCV: 92.5 fL (ref 80.0–100.0)
Platelets: 251 10*3/uL (ref 150–400)
RBC: 4.67 MIL/uL (ref 4.22–5.81)
RDW: 13.5 % (ref 11.5–15.5)
WBC: 15.1 10*3/uL — ABNORMAL HIGH (ref 4.0–10.5)
nRBC: 0 % (ref 0.0–0.2)

## 2019-01-17 LAB — GLUCOSE, CAPILLARY
Glucose-Capillary: 169 mg/dL — ABNORMAL HIGH (ref 70–99)
Glucose-Capillary: 206 mg/dL — ABNORMAL HIGH (ref 70–99)
Glucose-Capillary: 125 mg/dL — ABNORMAL HIGH (ref 70–99)
Glucose-Capillary: 139 mg/dL — ABNORMAL HIGH (ref 70–99)

## 2019-01-17 LAB — COMPREHENSIVE METABOLIC PANEL
ALT: 43 U/L (ref 0–44)
AST: 31 U/L (ref 15–41)
Albumin: 3.7 g/dL (ref 3.5–5.0)
Alkaline Phosphatase: 92 U/L (ref 38–126)
Anion gap: 10 (ref 5–15)
BUN: 25 mg/dL — ABNORMAL HIGH (ref 6–20)
CO2: 26 mmol/L (ref 22–32)
Calcium: 8.6 mg/dL — ABNORMAL LOW (ref 8.9–10.3)
Chloride: 99 mmol/L (ref 98–111)
Creatinine, Ser: 0.87 mg/dL (ref 0.61–1.24)
GFR calc Af Amer: >60 mL/min (ref >60)
GFR calc non Af Amer: >60 mL/min (ref >60)
Glucose, Bld: 176 mg/dL — ABNORMAL HIGH (ref 70–99)
Potassium: 3.7 mmol/L (ref 3.5–5.1)
Sodium: 135 mmol/L (ref 135–145)
Total Bilirubin: 1.1 mg/dL (ref 0.3–1.2)
Total Protein: 7.2 g/dL (ref 6.5–8.1)

## 2019-01-17 LAB — MAGNESIUM: Magnesium: 2.5 mg/dL — ABNORMAL HIGH (ref 1.7–2.4)

## 2019-01-17 LAB — PHOSPHORUS: Phosphorus: 3.1 mg/dL (ref 2.5–4.6)

## 2019-01-17 LAB — PROCALCITONIN: Procalcitonin: 0.25 ng/mL

## 2019-01-17 MED ORDER — LACTULOSE 10 GM/15ML PO SOLN
30.0000 g | Freq: Once | ORAL | Status: AC
  Administered 2019-01-17: 30 g via ORAL
  Filled 2019-01-17: qty 60

## 2019-01-17 NOTE — Progress Notes (Addendum)
PROGRESS NOTE    Kristopher [name]  IFO:[id number] DOB: [birth year] DOA: 01/14/2019 PCP: [name], DO   Brief Narrative:  This is a 58 year old man who underwent a left thoracotomy for a lower lung mass. He has developed A. fib with RVR overnight. Critical care medicine consulted for assistance managing this complication. From a surgical standpoint, he does not have a pneumothorax or pleural effusion, they are monitoring his chest tubes and chest tube output and because of his complications overnight they will continue him on suction for now   Assessment & Plan:   Active Problems:   Lung mass   Atrial fibrillation with RVR (HCC)   Shortness of breath   Acute respiratory failure with hypoxia (HCC)   Uncontrolled hypertension Continue current blood pressure medications Was on amiodarone drip Received Lasix x1 On metoprolol 125 twice daily  Atrial fibrillation with RVR: As above patient was on IV metoprolol and IV amiodarone, no po amio Rate and rhythm controlled now-NSR He is on metoprolol 125 twice daily as above Cards seeing the patient in consultation, further recommendations and adjustments by them  Diabetes mellitus type 2, controlled On metformin which is continued.  Continue on sliding scale coverage. Blood glucose AC at bedtime  Non small cell lung ca recently diagnosed. S/p bronchoscopy, left thoracotomy and left lower lobe lobectomy with insertion of 2 left-sided chest tubes. Management per primary. Quit smoking 3 weeks back. conitnue nicotine patch.  dyslipidemia on statin  DVT prophylaxis: Lovenox SQ  Code Status: Full code    Code Status Orders  (From admission, onward)         Start     Ordered   01/14/19 2119  Full code  Continuous     01/14/19 2118        Code Status History    Date Active Date Inactive Code Status Order ID Comments User Context   12/02/2017 1426 12/04/2017 1538 Full Code [id number]  Hessie Knows, MD Inpatient    08/14/2017 0059 08/14/2017 1933 Full Code [id number]  Lance Coon, MD ED   Advance Care Planning Activity     Family Communication: Discussed with patient will defer further conversations to primary team Disposition Plan:   Per primary Consults called: None Admission status: Inpatient   Consultants:   Critical care, cardiology, TRH  Procedures:  Dg Chest 1 View  Result Date: 01/16/2019 CLINICAL DATA:  Short of breath. Follow-up chest tube. EXAM: CHEST  1 VIEW COMPARISON:  01/15/2019 FINDINGS: Left-sided chest tube is in place. No significant pneumothorax visualized. Decreased lung volumes. Pulmonary vascular congestion. Atelectasis noted in the left base. IMPRESSION: 1. No significant change in aeration to the left lung. 2. No significant pneumothorax noted. 3. Pulmonary vascular congestion. Electronically Signed   By: Kerby Moors M.D.   On: 01/16/2019 05:42   Dg Chest 2 View  Result Date: 01/15/2019 CLINICAL DATA:  Postoperative EXAM: CHEST - 2 VIEW COMPARISON:  01/14/2019 FINDINGS: No significant change in examination status post left thoracotomy with left-sided chest tubes in position and no significant pneumothorax. Very low volume examination with mild diffuse interstitial opacity, likely bronchovascular crowding. No new airspace opacity. Mild cardiomegaly. Subcutaneous emphysema about the left chest wall. IMPRESSION: 1. No significant change in examination status post left thoracotomy with left-sided chest tubes in position and no significant pneumothorax. 2. Very low volume examination with mild diffuse interstitial opacity, likely bronchovascular crowding. No new airspace opacity. Electronically Signed   By: Eddie Candle M.D.   On: 01/15/2019  09:19   Dg Abd 1 View  Result Date: 01/17/2019 CLINICAL DATA:  Abdominal distention. EXAM: ABDOMEN - 1 VIEW COMPARISON:  01/16/2019 FINDINGS: Moderate distension of the stomach is again noted. There is persistent gaseous distension of the  colon. Decreased stool burden noted on today's exam. IMPRESSION: Persistent gaseous distension of the large bowel loops with interval decrease in overall stool burden. Electronically Signed   By: Kerby Moors M.D.   On: 01/17/2019 06:05   Dg Chest Port 1 View  Result Date: 01/14/2019 CLINICAL DATA:  Postop thoracotomy, left. EXAM: PORTABLE CHEST 1 VIEW COMPARISON:  Preoperative radiograph 12/16/2018, CT 11/17/2018 FINDINGS: Two left-sided chest tubes in place, 1 directed towards the apex, 1 towards the base. No definite visualized pneumothorax. Subcutaneous emphysema about the left lateral chest wall with adjacent skin staples. Lung volumes are low. Upper normal heart size, accentuated by low lung volumes. Bronchovascular crowding without pulmonary edema. No focal abnormality in the right lung. IMPRESSION: 1. Two left-sided chest tubes in place. No definite pneumothorax. Subcutaneous emphysema about the left lateral chest wall. 2. Low lung volumes with bronchovascular crowding. Electronically Signed   By: Keith Rake M.D.   On: 01/14/2019 17:59   Dg Abd Portable 2v  Result Date: 01/16/2019 CLINICAL DATA:  Short of breath. Chest tube. EXAM: PORTABLE ABDOMEN - 2 VIEW COMPARISON:  10/30/2017 FINDINGS: Moderate distension of the stomach. There is diffuse gaseous distension of the colon with a moderate stool burden identified within the right colon and descending colon. No significant small bowel dilatation identified. Previous left hip arthroplasty and anterior disc fusion of the lower lumbar spine. IMPRESSION: 1. Moderate gaseous distension of the, nonspecific. This may reflect colonic ileus versus distal bowel obstruction. Clinical correlation advised. 2. Moderate stool burden within the right colon and descending colon. Electronically Signed   By: Kerby Moors M.D.   On: 01/16/2019 05:44     Antimicrobials:   None currently   Subjective: Patient with significant improvement from  yesterday Now rate controlled Weaned off nonrebreather  Objective: Vitals:   01/17/19 1200 01/17/19 1300 01/17/19 1400 01/17/19 1500  BP:   120/76   Pulse: 84 72 82 83  Resp: 10 10 12 11   Temp:      TempSrc:      SpO2: 97% 98% 98% 99%  Weight:      Height:        Intake/Output Summary (Last 24 hours) at 01/17/2019 1610 Last data filed at 01/17/2019 1500 Gross per 24 hour  Intake 1080 ml  Output 1710 ml  Net -630 ml   Filed Weights   01/14/19 1112  Weight: 106.4 kg    Examination:  General exam: Appears calm and comfortable  Respiratory system: Clear to auscultation. Respiratory effort normal. Cardiovascular system: Irregular irregular but rate controlled gastrointestinal system: Abdomen is nondistended, soft and nontender.  Central nervous system: Alert and oriented. No focal neurological deficits. Extremities: Warm well perfused. Skin: Chest tubes in place without evidence of infection no rashes, lesions or ulcers Psychiatry: Judgement and insight appear normal. Mood & affect appropriate     Data Reviewed: I have personally reviewed following labs and imaging studies  CBC: Recent Labs  Lab 01/14/19 1847 01/15/19 0306 01/16/19 0424 01/17/19 0701  WBC 17.4* 13.7* 17.6* 15.1*  NEUTROABS  --  11.6* 12.3*  --   HGB 13.7 13.2 14.7 13.6  HCT 41.4 39.7 46.4 43.2  MCV 88.3 88.8 91.0 92.5  PLT 218 203 252 251   Basic  Metabolic Panel: Recent Labs  Lab 01/14/19 2021 01/15/19 0306 01/16/19 0424 01/16/19 0556 01/17/19 0701  NA 137 137 138  --  135  K 4.4 4.3 4.2  --  3.7  CL 102 102 100  --  99  CO2 25 26 29   --  26  GLUCOSE 217* 193* 156*  --  176*  BUN 14 14 15   --  25*  CREATININE 0.97 0.97 0.94  --  0.87  CALCIUM 8.4* 8.3* 8.9  --  8.6*  MG  --  1.9  --  2.1 2.5*  PHOS  --   --   --  3.2 3.1   GFR: Estimated Creatinine Clearance: 115.9 mL/min (by C-G formula based on SCr of 0.87 mg/dL). Liver Function Tests: Recent Labs  Lab 01/16/19 0556  01/17/19 0701  AST 54* 31  ALT 59* 43  ALKPHOS 103 92  BILITOT 1.2 1.1  PROT 7.9 7.2  ALBUMIN 4.3 3.7   No results for input(s): LIPASE, AMYLASE in the last 168 hours. No results for input(s): AMMONIA in the last 168 hours. Coagulation Profile: No results for input(s): INR, PROTIME in the last 168 hours. Cardiac Enzymes: No results for input(s): CKTOTAL, CKMB, CKMBINDEX, TROPONINI in the last 168 hours. BNP (last 3 results) No results for input(s): PROBNP in the last 8760 hours. HbA1C: Recent Labs    01/15/19 0306 01/16/19 0424  HGBA1C 7.4* 7.5*   CBG: Recent Labs  Lab 01/16/19 1139 01/16/19 1758 01/16/19 2142 01/17/19 0739 01/17/19 1230  GLUCAP 145* 171* 139* 169* 206*   Lipid Profile: No results for input(s): CHOL, HDL, LDLCALC, TRIG, CHOLHDL, LDLDIRECT in the last 72 hours. Thyroid Function Tests: No results for input(s): TSH, T4TOTAL, FREET4, T3FREE, THYROIDAB in the last 72 hours. Anemia Panel: No results for input(s): VITAMINB12, FOLATE, FERRITIN, TIBC, IRON, RETICCTPCT in the last 72 hours. Sepsis Labs: Recent Labs  Lab 01/16/19 0424 01/16/19 0711 01/16/19 1000 01/17/19 0701  PROCALCITON 0.32  --   --  0.25  LATICACIDVEN  --  2.1* 1.7  --     Recent Results (from the past 240 hour(s))  SARS CORONAVIRUS 2 (TAT 6-24 HRS) Nasopharyngeal Nasopharyngeal Swab     Status: None   Collection Time: 01/11/19 10:19 AM   Specimen: Nasopharyngeal Swab  Result Value Ref Range Status   SARS Coronavirus 2 NEGATIVE NEGATIVE Final    Comment: (NOTE) SARS-CoV-2 target nucleic acids are NOT DETECTED. The SARS-CoV-2 RNA is generally detectable in upper and lower respiratory specimens during the acute phase of infection. Negative results do not preclude SARS-CoV-2 infection, do not rule out co-infections with other pathogens, and should not be used as the sole basis for treatment or other patient management decisions. Negative results must be combined with clinical  observations, patient history, and epidemiological information. The expected result is Negative. Fact Sheet for Patients: SugarRoll.be Fact Sheet for Healthcare Providers: https://www.woods-mathews.com/ This test is not yet approved or cleared by the [place] FDA and  has been authorized for detection and/or diagnosis of SARS-CoV-2 by FDA under an Emergency Use Authorization (EUA). This EUA will remain  in effect (meaning this test can be used) for the duration of the COVID-19 declaration under Section 56 4(b)(1) of the Act, 21 U.S.C. section 360bbb-3(b)(1), unless the authorization is terminated or revoked sooner. Performed at [facility], [place] [address] [place], [place] [address]   MRSA PCR Screening     Status: None   Collection Time: 01/14/19  9:50  PM   Specimen: Nasal Mucosa; Nasopharyngeal  Result Value Ref Range Status   MRSA by PCR NEGATIVE NEGATIVE Final    Comment:        The GeneXpert MRSA Assay (FDA approved for NASAL specimens only), is one component of a comprehensive MRSA colonization surveillance program. It is not intended to diagnose MRSA infection nor to guide or monitor treatment for MRSA infections. Performed at [facility], [address], [place], [place] [address]          Radiology Studies: Dg Chest 1 View  Result Date: 01/16/2019 CLINICAL DATA:  Short of breath. Follow-up chest tube. EXAM: CHEST  1 VIEW COMPARISON:  01/15/2019 FINDINGS: Left-sided chest tube is in place. No significant pneumothorax visualized. Decreased lung volumes. Pulmonary vascular congestion. Atelectasis noted in the left base. IMPRESSION: 1. No significant change in aeration to the left lung. 2. No significant pneumothorax noted. 3. Pulmonary vascular congestion. Electronically Signed   By: Kerby Moors M.D.   On: 01/16/2019 05:42   Dg Abd 1 View  Result Date: 01/17/2019 CLINICAL DATA:  Abdominal  distention. EXAM: ABDOMEN - 1 VIEW COMPARISON:  01/16/2019 FINDINGS: Moderate distension of the stomach is again noted. There is persistent gaseous distension of the colon. Decreased stool burden noted on today's exam. IMPRESSION: Persistent gaseous distension of the large bowel loops with interval decrease in overall stool burden. Electronically Signed   By: Kerby Moors M.D.   On: 01/17/2019 06:05   Dg Abd Portable 2v  Result Date: 01/16/2019 CLINICAL DATA:  Short of breath. Chest tube. EXAM: PORTABLE ABDOMEN - 2 VIEW COMPARISON:  10/30/2017 FINDINGS: Moderate distension of the stomach. There is diffuse gaseous distension of the colon with a moderate stool burden identified within the right colon and descending colon. No significant small bowel dilatation identified. Previous left hip arthroplasty and anterior disc fusion of the lower lumbar spine. IMPRESSION: 1. Moderate gaseous distension of the, nonspecific. This may reflect colonic ileus versus distal bowel obstruction. Clinical correlation advised. 2. Moderate stool burden within the right colon and descending colon. Electronically Signed   By: Kerby Moors M.D.   On: 01/16/2019 05:44        Scheduled Meds: . albuterol  2.5 mg Nebulization Q6H  . atorvastatin  10 mg Oral Daily  . bisacodyl  10 mg Oral Daily  . Chlorhexidine Gluconate Cloth  6 each Topical Daily  . enoxaparin (LOVENOX) injection  40 mg Subcutaneous Q24H  . hydrALAZINE  100 mg Oral BID  . insulin aspart  0-5 Units Subcutaneous QHS  . insulin aspart  0-9 Units Subcutaneous TID WC  . ipratropium  0.5 mg Nebulization Q6H  . mouth rinse  15 mL Mouth Rinse BID  . metFORMIN  1,000 mg Oral BID WC  . methocarbamol  750 mg Oral QID  . metoprolol succinate  125 mg Oral BID  . montelukast  10 mg Oral QHS  . nicotine  21 mg Transdermal Daily  . nortriptyline  50 mg Oral QHS  . oxyCODONE  10 mg Oral QID  . pantoprazole  40 mg Oral Daily  . polyethylene glycol  17 g Oral  Daily  . sodium phosphate  1 enema Rectal Once   Continuous Infusions: . sodium chloride       LOS: 3 days    Time spent: 35 min    Nicolette Bang, MD [facility]  If 7PM-7AM, please contact night-coverage  01/17/2019, 4:10 PM

## 2019-01-17 NOTE — Progress Notes (Signed)
Fleet enema administered as ordered with no stool output. Provider made aware. New orders for lactulose.

## 2019-01-17 NOTE — Progress Notes (Signed)
This is a 58 year old patient of Dr. Genevive Bi', status post left thoracotomy and left lower lobectomy for lung cancer.  He was transferred to the floor on postoperative day 1,, but early yesterday morning, developed a atrial fibrillation with rapid ventricular response.  He endorsed dyspnea and was transferred to the intensive care unit for closer monitoring and evaluation.  Echocardiogram showed preserved LV function. A chest x-ray was performed that did not show any large pleural effusion or any pneumothorax.  Cardiology saw him and did not recommend anticoagulation.  Some medical interventions were performed and he has converted to normal sinus rhythm.  He is on nasal cannula oxygen this morning and states that he is feeling much better.  Today's Vitals   01/17/19 0700 01/17/19 0800 01/17/19 0900 01/17/19 0919  BP: (!) 123/95 (!) 122/96 125/89   Pulse: 81 84 85 88  Resp: 20 15 10    Temp:  98.2 F (36.8 C)    TempSrc:  Axillary    SpO2: 96% 97% 96%   Weight:      Height:      PainSc:  0-No pain     Body mass index is 33.65 kg/m. He is much more comfortable today. Telemetry shows normal sinus rhythm Abdomen is distended, but the patient states that this is normal for him. Surgical sites appear clean and intact without concern for infection.  No air leak from chest tube.  Impression and plan: This is a 58 year old man who underwent a left thoracotomy for a lower lung mass.  He developed A. fib with RVR and was transferred to the ICU.  He has since converted to normal sinus rhythm. From a surgical standpoint, he does not have a pneumothorax or pleural effusion.  Chest tube output has been minimal and there is no air leak.  We will place him on waterseal today and check a chest x-ray in the morning, anticipating that we will be able to remove it.  He appears stable for transfer back to the floor.

## 2019-01-17 NOTE — Progress Notes (Signed)
CRITICAL CARE PROGRESS NOTE    Name: Kristopher [name] MRN: [id number] DOB: 09/21/1960     LOS: 3   SUBJECTIVE FINDINGS & SIGNIFICANT EVENTS   Patient description:   This is a 58 yo male with a PMH Migraines, Former Tobacco Abuse, HTN, GERD, Dyspnea, Type II Diabetes Mellitus, Chronic Back Pain, Benign Hypertensive Kidney Disease, Arthritis, and Allergies. He presented to [facility] on 11/12 to undergo a bronchoscopy, left thoracotomy with left lower lobectomy, and insertion of 2 left sided chest tubes due to non-small cell carcinoma of left lung favoring squamous cell.  He was subsequently admitted to ICU postop for additional workup and treatment. Was stabilized and transferred out of the ICU. A rapid response called this morning for worsening dyspnea, left chest pain and afib with RVR .  His heart rate is sustaining in the 140s and 150s despite 5 mg of metoprolol and 150 mg of amiodarone.  He does not have a history of atrial fibrillation.  He denies chest pain, but reports worsening left-sided chest wall pain mostly at the chest tube insertion site.  His abdomen is very distended but he states that that is his baseline.  He reports irregular bowel movement since hospitalization.  SIGNIFICANT EVENTS/STUDIES:  11/12: Pt admitted to ICU post left thoracotomy with left lower lobectomy  11/13/202: Transferred out of the ICU 11/14.2020: Returned transfer to MICU due to Afib with RVR, worsening left chest pain and acute pulmonary edema 11/15 - improved , no BM x2d patient states he has not eaten anything solid in some time  Lines / Drains: -piv x2  -Chest tube left hemithorax- minimal active drain no leak on forced expiration  Cultures / Sepsis markers: -PCT 0.25 -  Antibiotics: none   Protocols /  Consultants: Surgery  CC   Tests / Events: CT abd pending  CXR in am   Overnight: No acute events   PAST MEDICAL HISTORY   Past Medical History:  Diagnosis Date  . Allergy   . Arthritis    left foot  . Benign hypertensive kidney disease   . Chronic back pain    Four rods in back  . Diabetes mellitus, type 2 ([place])   . Dyspnea   . GERD (gastroesophageal reflux disease)   . Hypertension   . Migraines    daily     SURGICAL HISTORY   Past Surgical History:  Procedure Laterality Date  . APPENDECTOMY    . BACK SURGERY    . COLONOSCOPY WITH PROPOFOL N/A 02/19/2016   Procedure: COLONOSCOPY WITH PROPOFOL;  Surgeon: Lucilla Lame, MD;  Location: [place];  Service: Endoscopy;  Laterality: N/A;  . COLONOSCOPY WITH PROPOFOL N/A 01/19/2018   Procedure: COLONOSCOPY WITH PROPOFOL;  Surgeon: Lucilla Lame, MD;  Location: [place];  Service: Endoscopy;  Laterality: N/A;  Diabetic - oral meds  . DG OPERATIVE LEFT HIP ([place] HX)     10/19  . ELECTROMAGNETIC NAVIGATION BROCHOSCOPY Left 11/18/2018   Procedure: ELECTROMAGNETIC NAVIGATION BRONCHOSCOPY;  Surgeon: Tyler Pita, MD;  Location: [facility];  Service: Cardiopulmonary;  Laterality: Left;  . FOOT SURGERY Left    Screws and plates  . JOINT REPLACEMENT Left 12/2017   DR Rudene Christians Hip  . KNEE SURGERY Left    X 2  . LEG SURGERY    . POLYPECTOMY N/A 02/19/2016   Procedure: POLYPECTOMY;  Surgeon: Lucilla Lame, MD;  Location: [place];  Service: Endoscopy;  Laterality: N/A;  . POLYPECTOMY  01/19/2018  Procedure: POLYPECTOMY;  Surgeon: Lucilla Lame, MD;  Location: [place];  Service: Endoscopy;;  . THORACOTOMY Left 01/14/2019   Procedure: THORACOTOMY MAJOR, LEFT;  Surgeon: Nestor Lewandowsky, MD;  Location: [facility];  Service: General;  Laterality: Left;  . TOTAL HIP ARTHROPLASTY Left 12/02/2017   Procedure: TOTAL HIP ARTHROPLASTY ANTERIOR APPROACH;  Surgeon: Hessie Knows, MD;  Location: [facility];   Service: Orthopedics;  Laterality: Left;  [name] VIDEO BRONCHOSCOPY Left 01/14/2019   Procedure: VIDEO BRONCHOSCOPY WITH FLUORO, LEFT;  Surgeon: Nestor Lewandowsky, MD;  Location: [facility];  Service: General;  Laterality: Left;     FAMILY HISTORY   Family History  Problem Relation Age of Onset  . Cancer Father   . Diabetes Sister   . Thrombosis Sister      SOCIAL HISTORY   Social History   Tobacco Use  . Smoking status: Former Smoker    Packs/day: 0.00    Years: 35.00    Pack years: 0.00  . Smokeless tobacco: Never Used  Substance Use Topics  . Alcohol use: No    Alcohol/week: 0.0 standard drinks  . Drug use: Yes    Types: Oxycodone    Comment: prescribed     MEDICATIONS   Current Medication:  Current Facility-Administered Medications:  .  Place/Maintain arterial line, , , Until Discontinued **AND** 0.9 %  sodium chloride infusion, , Intra-arterial, PRN, Nestor Lewandowsky, MD .  albuterol (PROVENTIL) (2.5 MG/3ML) 0.083% nebulizer solution 2.5 mg, 2.5 mg, Nebulization, Q6H, Tukov-Yual, Magdalene S, NP, 2.5 mg at 01/17/19 0800 .  atorvastatin (LIPITOR) tablet 10 mg, 10 mg, Oral, Daily, Nestor Lewandowsky, MD, 10 mg at 01/17/19 0926 .  bisacodyl (DULCOLAX) EC tablet 10 mg, 10 mg, Oral, Daily, Nestor Lewandowsky, MD, 10 mg at 01/17/19 0927 .  Chlorhexidine Gluconate Cloth 2 % PADS 6 each, 6 each, Topical, Daily, Awilda Bill, NP, 6 each at 01/16/19 1406 .  enoxaparin (LOVENOX) injection 40 mg, 40 mg, Subcutaneous, Q24H, Tylene Fantasia, PA-C, 40 mg at 01/17/19 3419 .  hydrALAZINE (APRESOLINE) injection 10 mg, 10 mg, Intravenous, Q6H PRN, Tukov-Yual, Magdalene S, NP .  hydrALAZINE (APRESOLINE) tablet 100 mg, 100 mg, Oral, BID, Nestor Lewandowsky, MD, 100 mg at 01/17/19 6222 .  insulin aspart (novoLOG) injection 0-5 Units, 0-5 Units, Subcutaneous, QHS, Olean Ree, Zachary R, PA-C .  insulin aspart (novoLOG) injection 0-9 Units, 0-9 Units, Subcutaneous, TID WC, Tylene Fantasia, PA-C, 3 Units at  01/17/19 0913 .  ipratropium (ATROVENT) nebulizer solution 0.5 mg, 0.5 mg, Nebulization, Q6H, Tukov-Yual, Magdalene S, NP, 0.5 mg at 01/17/19 0800 .  MEDLINE mouth rinse, 15 mL, Mouth Rinse, BID, Tukov-Yual, Magdalene S, NP, 15 mL at 01/16/19 2145 .  metFORMIN (GLUCOPHAGE-XR) 24 hr tablet 1,000 mg, 1,000 mg, Oral, BID WC, Nestor Lewandowsky, MD, 1,000 mg at 01/17/19 0928 .  methocarbamol (ROBAXIN) tablet 750 mg, 750 mg, Oral, QID, Nestor Lewandowsky, MD, 750 mg at 01/17/19 0930 .  metoprolol succinate (TOPROL-XL) 24 hr tablet 125 mg, 125 mg, Oral, BID, Dhungel, Nishant, MD, 125 mg at 01/17/19 0919 .  montelukast (SINGULAIR) tablet 10 mg, 10 mg, Oral, QHS, Oaks, Christia Reading, MD, 10 mg at 01/16/19 2148 .  morphine 2 MG/ML injection 1-2 mg, 1-2 mg, Intravenous, Q1H PRN, Nestor Lewandowsky, MD, 2 mg at 01/16/19 2150 .  nicotine (NICODERM CQ - dosed in mg/24 hours) patch 21 mg, 21 mg, Transdermal, Daily, Oaks, [place], MD .  nicotine polacrilex (NICORETTE) gum 2 mg, 2 mg, Oral, PRN, Nestor Lewandowsky, MD .  nortriptyline (PAMELOR) capsule 50 mg, 50 mg, Oral, QHS, Oaks, Christia Reading, MD, 50 mg at 01/16/19 2145 .  ondansetron (ZOFRAN) injection 4 mg, 4 mg, Intravenous, Q6H PRN, Nestor Lewandowsky, MD, 4 mg at 01/16/19 1335 .  oxyCODONE (Oxy IR/ROXICODONE) immediate release tablet 10 mg, 10 mg, Oral, QID, Awilda Bill, NP, 10 mg at 01/17/19 0924 .  pantoprazole (PROTONIX) EC tablet 40 mg, 40 mg, Oral, Daily, Nestor Lewandowsky, MD, 40 mg at 01/17/19 7371 .  polyethylene glycol (MIRALAX / GLYCOLAX) packet 17 g, 17 g, Oral, Daily, Tukov-Yual, Magdalene S, NP .  sodium phosphate (FLEET) 7-19 GM/118ML enema 1 enema, 1 enema, Rectal, Once, Tukov-Yual, Magdalene S, NP    ALLERGIES   Aspirin, Epinephrine, Lidocaine, Novocain [procaine], Penicillins, Strawberry extract, and Shellfish allergy    REVIEW OF SYSTEMS     10 point ROS conducted and is as per subjective findings, specifically no chest pain no abdominal pain  PHYSICAL  EXAMINATION   Vital Signs: Temp:  [97.9 F (36.6 C)-99.3 F (37.4 C)] 98.2 F (36.8 C) (11/15 0800) Pulse Rate:  [76-96] 84 (11/15 1200) Resp:  [9-22] 10 (11/15 1200) BP: (113-129)/(85-100) 127/100 (11/15 1000) SpO2:  [94 %-100 %] 97 % (11/15 1200) FiO2 (%):  [100 %] 100 % (11/14 1930)  GENERAL:NAD HEAD: Normocephalic, atraumatic.  EYES: Pupils equal, round, reactive to light.  No scleral icterus.  MOUTH: Moist mucosal membrane. NECK: Supple. No thyromegaly. No nodules. No JVD.  PULMONARY: CTA on right, left chest with chest tube CARDIOVASCULAR: S1 and S2. Regular rate and rhythm. No murmurs, rubs, or gallops.  GASTROINTESTINAL: Soft, nontender, non-distended. No masses. Positive bowel sounds. No hepatosplenomegaly.  MUSCULOSKELETAL: No swelling, clubbing, or edema.  NEUROLOGIC: Mild distress due to acute illness SKIN:intact,warm,dry   PERTINENT DATA     Infusions: . sodium chloride     Scheduled Medications: . albuterol  2.5 mg Nebulization Q6H  . atorvastatin  10 mg Oral Daily  . bisacodyl  10 mg Oral Daily  . Chlorhexidine Gluconate Cloth  6 each Topical Daily  . enoxaparin (LOVENOX) injection  40 mg Subcutaneous Q24H  . hydrALAZINE  100 mg Oral BID  . insulin aspart  0-5 Units Subcutaneous QHS  . insulin aspart  0-9 Units Subcutaneous TID WC  . ipratropium  0.5 mg Nebulization Q6H  . mouth rinse  15 mL Mouth Rinse BID  . metFORMIN  1,000 mg Oral BID WC  . methocarbamol  750 mg Oral QID  . metoprolol succinate  125 mg Oral BID  . montelukast  10 mg Oral QHS  . nicotine  21 mg Transdermal Daily  . nortriptyline  50 mg Oral QHS  . oxyCODONE  10 mg Oral QID  . pantoprazole  40 mg Oral Daily  . polyethylene glycol  17 g Oral Daily  . sodium phosphate  1 enema Rectal Once   PRN Medications: Place/Maintain arterial line **AND** sodium chloride, hydrALAZINE, morphine injection, nicotine polacrilex, ondansetron (ZOFRAN) IV Hemodynamic parameters:    Intake/Output: 11/14 0701 - 11/15 0700 In: 1200 [P.O.:1200] Out: 1410 [Urine:1250; Chest Tube:160]  Ventilator  Settings: FiO2 (%):  [100 %] 100 %     LAB RESULTS:  Basic Metabolic Panel: Recent Labs  Lab 01/14/19 2021 01/15/19 0306 01/16/19 0424 01/16/19 0556 01/17/19 0701  NA 137 137 138  --  135  K 4.4 4.3 4.2  --  3.7  CL 102 102 100  --  99  CO2 25 26 29   --  26  GLUCOSE  217* 193* 156*  --  176*  BUN 14 14 15   --  25*  CREATININE 0.97 0.97 0.94  --  0.87  CALCIUM 8.4* 8.3* 8.9  --  8.6*  MG  --  1.9  --  2.1 2.5*  PHOS  --   --   --  3.2 3.1   Liver Function Tests: Recent Labs  Lab 01/16/19 0556 01/17/19 0701  AST 54* 31  ALT 59* 43  ALKPHOS 103 92  BILITOT 1.2 1.1  PROT 7.9 7.2  ALBUMIN 4.3 3.7   No results for input(s): LIPASE, AMYLASE in the last 168 hours. No results for input(s): AMMONIA in the last 168 hours. CBC: Recent Labs  Lab 01/14/19 1847 01/15/19 0306 01/16/19 0424 01/17/19 0701  WBC 17.4* 13.7* 17.6* 15.1*  NEUTROABS  --  11.6* 12.3*  --   HGB 13.7 13.2 14.7 13.6  HCT 41.4 39.7 46.4 43.2  MCV 88.3 88.8 91.0 92.5  PLT 218 203 252 251   Cardiac Enzymes: No results for input(s): CKTOTAL, CKMB, CKMBINDEX, TROPONINI in the last 168 hours. BNP: Invalid input(s): POCBNP CBG: Recent Labs  Lab 01/16/19 0729 01/16/19 1139 01/16/19 1758 01/16/19 2142 01/17/19 0739  GLUCAP 161* 145* 171* 139* 169*     IMAGING RESULTS:  Imaging: Dg Chest 1 View  Result Date: 01/16/2019 CLINICAL DATA:  Short of breath. Follow-up chest tube. EXAM: CHEST  1 VIEW COMPARISON:  01/15/2019 FINDINGS: Left-sided chest tube is in place. No significant pneumothorax visualized. Decreased lung volumes. Pulmonary vascular congestion. Atelectasis noted in the left base. IMPRESSION: 1. No significant change in aeration to the left lung. 2. No significant pneumothorax noted. 3. Pulmonary vascular congestion. Electronically Signed   By: Kerby Moors M.D.   On:  01/16/2019 05:42   Dg Abd 1 View  Result Date: 01/17/2019 CLINICAL DATA:  Abdominal distention. EXAM: ABDOMEN - 1 VIEW COMPARISON:  01/16/2019 FINDINGS: Moderate distension of the stomach is again noted. There is persistent gaseous distension of the colon. Decreased stool burden noted on today's exam. IMPRESSION: Persistent gaseous distension of the large bowel loops with interval decrease in overall stool burden. Electronically Signed   By: Kerby Moors M.D.   On: 01/17/2019 06:05   Dg Abd Portable 2v  Result Date: 01/16/2019 CLINICAL DATA:  Short of breath. Chest tube. EXAM: PORTABLE ABDOMEN - 2 VIEW COMPARISON:  10/30/2017 FINDINGS: Moderate distension of the stomach. There is diffuse gaseous distension of the colon with a moderate stool burden identified within the right colon and descending colon. No significant small bowel dilatation identified. Previous left hip arthroplasty and anterior disc fusion of the lower lumbar spine. IMPRESSION: 1. Moderate gaseous distension of the, nonspecific. This may reflect colonic ileus versus distal bowel obstruction. Clinical correlation advised. 2. Moderate stool burden within the right colon and descending colon. Electronically Signed   By: Kerby Moors M.D.   On: 01/16/2019 05:44      EXAM: ABDOMEN - 1 VIEW  COMPARISON:  01/16/2019  FINDINGS: Moderate distension of the stomach is again noted. There is persistent gaseous distension of the colon. Decreased stool burden noted on today's exam.  IMPRESSION: Persistent gaseous distension of the large bowel loops with interval decrease in overall stool burden.   Electronically Signed   By: Kerby Moors M.D.   On: 01/17/2019 06:05     CLINICAL DATA:  Short of breath. Follow-up chest tube.  EXAM: CHEST  1 VIEW  COMPARISON:  01/15/2019  FINDINGS: Left-sided chest tube  is in place. No significant pneumothorax visualized. Decreased lung volumes. Pulmonary vascular  congestion. Atelectasis noted in the left base.  IMPRESSION: 1. No significant change in aeration to the left lung. 2. No significant pneumothorax noted. 3. Pulmonary vascular congestion.   Electronically Signed   By: Kerby Moors M.D.   On: 01/16/2019 05:42  ASSESSMENT AND PLAN     Atrial fibrillation with rapid ventricular response  -Status post IV metoprolol and IV amiodarone  -Currently rate and rhythm controlled  -Cardiology on case appreciate input -Continue home dose of metoprolol succinate 125 mg twice daily -ICU telemetry monitoring  Lung mass status post CT-guided biopsy favoring squamous cell lung CA   -Post thoracotomy with left chest tube   -Surgery on case appreciate input   -Chest tube evaluation-good tidal wave, no air leak on forced expiratory maneuver, minimal output approximately 20 cc overnight -Plan for repeat CXR in a.m. with possible removal of chest tube per surgery -oxygen as needed   Constipation   -Positive bowel sounds  -Patient has not been eating  -Status post KUB with gaseous distention with decreased stool burden and radiographic absence of ileus at this time   -Discussed with surgery continue constipation protocol  GI/Nutrition GI PROPHYLAXIS as indicated DIET-->TF's as tolerated Constipation protocol as indicated  ENDO - ICU hypoglycemic\Hyperglycemia protocol -check FSBS per protocol   ELECTROLYTES -follow labs as needed -replace as needed -pharmacy consultation   DVT/GI PRX ordered -SCDs  TRANSFUSIONS AS NEEDED MONITOR FSBS ASSESS the need for LABS as needed   Critical care provider statement:    Critical care time (minutes):  32   Critical care time was exclusive of:  Separately billable procedures and treating other patients   Critical care was necessary to treat or prevent imminent or life-threatening deterioration of the following conditions:   Left lung mass favoring squamous cell lung CA, status post  thoracotomy, chest tube management, atrial fibrillation with rapid ventricular response, constipation, multiple comorbid conditions   Critical care was time spent personally by me on the following activities:  Development of treatment plan with patient or surrogate, discussions with consultants, evaluation of patient's response to treatment, examination of patient, obtaining history from patient or surrogate, ordering and performing treatments and interventions, ordering and review of laboratory studies and re-evaluation of patient's condition.  I assumed direction of critical care for this patient from another provider in my specialty: no    This document was prepared using Dragon voice recognition software and may include unintentional dictation errors.    Ottie Glazier, M.D.  Division of [place]

## 2019-01-17 NOTE — Progress Notes (Signed)
Patient was able to pass stool today twice, large amount, smooth texture.  RN cleaned him and changed his pink foam.  Patient's HR and BP have been within normal limits today.  Patient has not needed as much pain medicine today and reports feeling much better.  MD changed his chest dressings after there was some fluid leaking.  Skin around sites looked good.  She stated no additional stiches would be needed.   Patient's son visited him (his wife was unable to visit).  Phillis Knack, RN

## 2019-01-17 NOTE — Progress Notes (Signed)
Patient Name: Kristopher [name] Date of Encounter: 01/17/2019  Hospital Problem List     Active Problems:   Lung mass   Atrial fibrillation with RVR (HCC)   Shortness of breath   Acute respiratory failure with hypoxia [facility])    Patient Profile     Pt post op lobectomy. Afib post op. Has converted to nsr.   Subjective   No cardiac complaints  Inpatient Medications    . albuterol  2.5 mg Nebulization Q6H  . atorvastatin  10 mg Oral Daily  . bisacodyl  10 mg Oral Daily  . Chlorhexidine Gluconate Cloth  6 each Topical Daily  . enoxaparin (LOVENOX) injection  40 mg Subcutaneous Q24H  . hydrALAZINE  100 mg Oral BID  . insulin aspart  0-5 Units Subcutaneous QHS  . insulin aspart  0-9 Units Subcutaneous TID WC  . ipratropium  0.5 mg Nebulization Q6H  . mouth rinse  15 mL Mouth Rinse BID  . metFORMIN  1,000 mg Oral BID WC  . methocarbamol  750 mg Oral QID  . metoprolol succinate  125 mg Oral BID  . montelukast  10 mg Oral QHS  . nicotine  21 mg Transdermal Daily  . nortriptyline  50 mg Oral QHS  . oxyCODONE  10 mg Oral QID  . pantoprazole  40 mg Oral Daily  . polyethylene glycol  17 g Oral Daily  . sodium phosphate  1 enema Rectal Once    Vital Signs    Vitals:   01/17/19 1200 01/17/19 1300 01/17/19 1400 01/17/19 1500  BP:   120/76   Pulse: 84 72 82 83  Resp: 10 10 12 11   Temp:      TempSrc:      SpO2: 97% 98% 98% 99%  Weight:      Height:        Intake/Output Summary (Last 24 hours) at 01/17/2019 1817 Last data filed at 01/17/2019 1500 Gross per 24 hour  Intake 840 ml  Output 1360 ml  Net -520 ml   Filed Weights   01/14/19 1112  Weight: 106.4 kg    Physical Exam    GEN: Well nourished, well developed, in no acute distress.  HEENT: normal.  Neck: Supple, no JVD, carotid bruits, or masses. Cardiac: RRR, no murmurs, rubs, or gallops. No clubbing, cyanosis, edema.  Radials/DP/PT 2+ and equal bilaterally.  Respiratory:  Respirations regular and  unlabored, clear to auscultation bilaterally. GI: Soft, nontender, nondistended, BS + x 4. MS: no deformity or atrophy. Skin: warm and dry, no rash. Neuro:  Strength and sensation are intact. Psych: Normal affect.  Labs    CBC Recent Labs    01/15/19 0306 01/16/19 0424 01/17/19 0701  WBC 13.7* 17.6* 15.1*  NEUTROABS 11.6* 12.3*  --   HGB 13.2 14.7 13.6  HCT 39.7 46.4 43.2  MCV 88.8 91.0 92.5  PLT 203 252 016   Basic Metabolic Panel Recent Labs    01/16/19 0424 01/16/19 0556 01/17/19 0701  NA 138  --  135  K 4.2  --  3.7  CL 100  --  99  CO2 29  --  26  GLUCOSE 156*  --  176*  BUN 15  --  25*  CREATININE 0.94  --  0.87  CALCIUM 8.9  --  8.6*  MG  --  2.1 2.5*  PHOS  --  3.2 3.1   Liver Function Tests Recent Labs    01/16/19 0556 01/17/19 0701  AST  54* 31  ALT 59* 43  ALKPHOS 103 92  BILITOT 1.2 1.1  PROT 7.9 7.2  ALBUMIN 4.3 3.7   No results for input(s): LIPASE, AMYLASE in the last 72 hours. Cardiac Enzymes No results for input(s): CKTOTAL, CKMB, CKMBINDEX, TROPONINI in the last 72 hours. BNP Recent Labs    01/16/19 0556  BNP 69.0   D-Dimer No results for input(s): DDIMER in the last 72 hours. Hemoglobin A1C Recent Labs    01/16/19 0424  HGBA1C 7.5*   Fasting Lipid Panel No results for input(s): CHOL, HDL, LDLCALC, TRIG, CHOLHDL, LDLDIRECT in the last 72 hours. Thyroid Function Tests No results for input(s): TSH, T4TOTAL, T3FREE, THYROIDAB in the last 72 hours.  Invalid input(s): FREET3  Telemetry    nsr  ECG    afi converted to nsr.   Radiology    Dg Chest 1 View  Result Date: 01/16/2019 CLINICAL DATA:  Short of breath. Follow-up chest tube. EXAM: CHEST  1 VIEW COMPARISON:  01/15/2019 FINDINGS: Left-sided chest tube is in place. No significant pneumothorax visualized. Decreased lung volumes. Pulmonary vascular congestion. Atelectasis noted in the left base. IMPRESSION: 1. No significant change in aeration to the left lung. 2. No  significant pneumothorax noted. 3. Pulmonary vascular congestion. Electronically Signed   By: Kerby Moors M.D.   On: 01/16/2019 05:42   Dg Chest 2 View  Result Date: 01/15/2019 CLINICAL DATA:  Postoperative EXAM: CHEST - 2 VIEW COMPARISON:  01/14/2019 FINDINGS: No significant change in examination status post left thoracotomy with left-sided chest tubes in position and no significant pneumothorax. Very low volume examination with mild diffuse interstitial opacity, likely bronchovascular crowding. No new airspace opacity. Mild cardiomegaly. Subcutaneous emphysema about the left chest wall. IMPRESSION: 1. No significant change in examination status post left thoracotomy with left-sided chest tubes in position and no significant pneumothorax. 2. Very low volume examination with mild diffuse interstitial opacity, likely bronchovascular crowding. No new airspace opacity. Electronically Signed   By: Eddie Candle M.D.   On: 01/15/2019 09:19   Dg Abd 1 View  Result Date: 01/17/2019 CLINICAL DATA:  Abdominal distention. EXAM: ABDOMEN - 1 VIEW COMPARISON:  01/16/2019 FINDINGS: Moderate distension of the stomach is again noted. There is persistent gaseous distension of the colon. Decreased stool burden noted on today's exam. IMPRESSION: Persistent gaseous distension of the large bowel loops with interval decrease in overall stool burden. Electronically Signed   By: Kerby Moors M.D.   On: 01/17/2019 06:05   Dg Chest Port 1 View  Result Date: 01/14/2019 CLINICAL DATA:  Postop thoracotomy, left. EXAM: PORTABLE CHEST 1 VIEW COMPARISON:  Preoperative radiograph 12/16/2018, CT 11/17/2018 FINDINGS: Two left-sided chest tubes in place, 1 directed towards the apex, 1 towards the base. No definite visualized pneumothorax. Subcutaneous emphysema about the left lateral chest wall with adjacent skin staples. Lung volumes are low. Upper normal heart size, accentuated by low lung volumes. Bronchovascular crowding without  pulmonary edema. No focal abnormality in the right lung. IMPRESSION: 1. Two left-sided chest tubes in place. No definite pneumothorax. Subcutaneous emphysema about the left lateral chest wall. 2. Low lung volumes with bronchovascular crowding. Electronically Signed   By: Keith Rake M.D.   On: 01/14/2019 17:59   Dg Abd Portable 2v  Result Date: 01/16/2019 CLINICAL DATA:  Short of breath. Chest tube. EXAM: PORTABLE ABDOMEN - 2 VIEW COMPARISON:  10/30/2017 FINDINGS: Moderate distension of the stomach. There is diffuse gaseous distension of the colon with a moderate stool burden identified  within the right colon and descending colon. No significant small bowel dilatation identified. Previous left hip arthroplasty and anterior disc fusion of the lower lumbar spine. IMPRESSION: 1. Moderate gaseous distension of the, nonspecific. This may reflect colonic ileus versus distal bowel obstruction. Clinical correlation advised. 2. Moderate stool burden within the right colon and descending colon. Electronically Signed   By: Kerby Moors M.D.   On: 01/16/2019 05:44    Assessment & Plan    AFIB-has converted to nsr. Afib was likely secondary to his lobectomy. Is back in nsr. Echo showd normal rhythm. No regional wall motion abnormality. Stable at present . Will follow for recurrence. Continue with further post op care with throacic surgery.   Signed, Javier Docker Zyhir Cappella MD 01/17/2019, 6:17 PM  Pager: [phone]

## 2019-01-18 ENCOUNTER — Inpatient Hospital Stay: Payer: Medicare Other

## 2019-01-18 DIAGNOSIS — R918 Other nonspecific abnormal finding of lung field: Secondary | ICD-10-CM

## 2019-01-18 DIAGNOSIS — J9601 Acute respiratory failure with hypoxia: Secondary | ICD-10-CM

## 2019-01-18 LAB — CBC
HCT: 40.9 % (ref 39.0–52.0)
Hemoglobin: 13.5 g/dL (ref 13.0–17.0)
MCH: 29.4 pg (ref 26.0–34.0)
MCHC: 33 g/dL (ref 30.0–36.0)
MCV: 89.1 fL (ref 80.0–100.0)
Platelets: 257 10*3/uL (ref 150–400)
RBC: 4.59 MIL/uL (ref 4.22–5.81)
RDW: 13.2 % (ref 11.5–15.5)
WBC: 13 10*3/uL — ABNORMAL HIGH (ref 4.0–10.5)
nRBC: 0 % (ref 0.0–0.2)

## 2019-01-18 LAB — GLUCOSE, CAPILLARY
Glucose-Capillary: 136 mg/dL — ABNORMAL HIGH (ref 70–99)
Glucose-Capillary: 111 mg/dL — ABNORMAL HIGH (ref 70–99)
Glucose-Capillary: 169 mg/dL — ABNORMAL HIGH (ref 70–99)
Glucose-Capillary: 120 mg/dL — ABNORMAL HIGH (ref 70–99)

## 2019-01-18 LAB — SURGICAL PATHOLOGY

## 2019-01-18 LAB — PROCALCITONIN: Procalcitonin: 0.1 ng/mL

## 2019-01-18 MED ORDER — CHLORHEXIDINE GLUCONATE CLOTH 2 % EX PADS
6.0000 | MEDICATED_PAD | Freq: Every day | CUTANEOUS | Status: DC
  Administered 2019-01-19: 6 via TOPICAL

## 2019-01-18 MED ORDER — IPRATROPIUM-ALBUTEROL 0.5-2.5 (3) MG/3ML IN SOLN
3.0000 mL | Freq: Four times a day (QID) | RESPIRATORY_TRACT | Status: DC
  Administered 2019-01-18 – 2019-01-25 (×18): 3 mL via RESPIRATORY_TRACT
  Filled 2019-01-18 (×23): qty 3

## 2019-01-18 MED ORDER — ENOXAPARIN SODIUM 40 MG/0.4ML ~~LOC~~ SOLN
40.0000 mg | SUBCUTANEOUS | Status: DC
  Administered 2019-01-19 – 2019-01-23 (×3): 40 mg via SUBCUTANEOUS
  Filled 2019-01-18 (×8): qty 0.4

## 2019-01-18 MED ORDER — SENNOSIDES-DOCUSATE SODIUM 8.6-50 MG PO TABS
2.0000 | ORAL_TABLET | Freq: Two times a day (BID) | ORAL | Status: DC
  Administered 2019-01-18 – 2019-01-19 (×3): 2 via ORAL
  Filled 2019-01-18 (×7): qty 2

## 2019-01-18 NOTE — Progress Notes (Signed)
Pharmacy Electrolyte Monitoring Consult:  Pharmacy consulted to assist in monitoring and replacing electrolytes in this 58 y.o. male admitted on 01/14/2019 for video bronchoscopy and left thoracotomy due to  Patient with postsurgical bout of atrial fibrillation, currently resolved. Patient with CHADSVASc of 2 (Diabetes and Hypertension).   Labs:  Sodium (mmol/L)  Date Value  01/17/2019 135  10/06/2018 142   Potassium (mmol/L)  Date Value  01/17/2019 3.7   Magnesium (mg/dL)  Date Value  01/17/2019 2.5 (H)   Phosphorus (mg/dL)  Date Value  01/17/2019 3.1   Calcium (mg/dL)  Date Value  01/17/2019 8.6 (L)   Albumin (g/dL)  Date Value  01/17/2019 3.7  10/06/2018 4.5    Assessment/Plan: Last BMP was with am labs on 11/15. Will obtain BMP with am labs.   In setting of atrial fibrillation will replace for goal potassium ~ 4 and goal magnesium ~ 2.   Pharmacy will continue to monitor and adjust per consult.   Heitor Steinhoff,Fadil L 01/18/2019 3:12 PM

## 2019-01-18 NOTE — Progress Notes (Signed)
PROGRESS NOTE    Kristopher [name]  PZW:[id number] DOB: 29-Oct-1960 DOA: 01/14/2019 PCP: [name], DO   Brief Narrative:  This is a 59 year old man who underwent a left thoracotomy for a lower lung mass. He has developed A. fib with RVRnow resolved.  THR consulted for htn and dm.Critical care medicineconsulted for assistancemanaging this complication. From a surgical standpoint, he does not have a pneumothorax or pleural effusion, they aremonitoringhis chest tubes and chest tube outputand because of his complications overnight they will continue him on suction for now.   Assessment & Plan:   Active Problems:   Lung mass   Atrial fibrillation with RVR (HCC)   Shortness of breath   Acute respiratory failure with hypoxia (HCC)   Uncontrolled hypertension Continue current blood pressure medications, control adequate Was on amiodarone drip Received Lasix x1 On metoprolol 125 twice daily  Atrial fibrillation with RVR: As above patient was on IV metoprolol and IV amiodarone, no po amio now Rate and rhythm controlled now-NSR He is on metoprolol 125 twice daily as above Cards seeing the patient in consultation, further recommendations and adjustments by them  Diabetes mellitus type 2, controlled Excellent control On metformin which is continued. Continueonsliding scale coverage. Blood glucose AC at bedtime  Non small cell lung ca recently diagnosed. S/p bronchoscopy, left thoracotomy and left lower lobe lobectomy with insertion of 2 left-sided chest tubes. Management per primary. Quit smoking 3 weeks back. conitnue nicotine patch.  dyslipidemia on statin  Will sign off as htn and dm well controlled, call with any additional questions  DVT prophylaxis: Lovenox SQ  Code Status: full    Code Status Orders  (From admission, onward)         Start     Ordered   01/14/19 2119  Full code  Continuous     01/14/19 2118        Code Status History     Date Active Date Inactive Code Status Order ID Comments User Context   12/02/2017 1426 12/04/2017 1538 Full Code [id number]  Hessie Knows, MD Inpatient   08/14/2017 0059 08/14/2017 1933 Full Code [id number]  Lance Coon, MD ED   Advance Care Planning Activity     Family Communication: Discussed in detail with patient Disposition Plan:   per primary team Consults called: None Admission status: Inpatient   Consultants:   ccm, cards for TEE, TRH  Procedures:  Dg Chest 1 View  Result Date: 01/16/2019 CLINICAL DATA:  Short of breath. Follow-up chest tube. EXAM: CHEST  1 VIEW COMPARISON:  01/15/2019 FINDINGS: Left-sided chest tube is in place. No significant pneumothorax visualized. Decreased lung volumes. Pulmonary vascular congestion. Atelectasis noted in the left base. IMPRESSION: 1. No significant change in aeration to the left lung. 2. No significant pneumothorax noted. 3. Pulmonary vascular congestion. Electronically Signed   By: Kerby Moors M.D.   On: 01/16/2019 05:42   Dg Chest 2 View  Result Date: 01/15/2019 CLINICAL DATA:  Postoperative EXAM: CHEST - 2 VIEW COMPARISON:  01/14/2019 FINDINGS: No significant change in examination status post left thoracotomy with left-sided chest tubes in position and no significant pneumothorax. Very low volume examination with mild diffuse interstitial opacity, likely bronchovascular crowding. No new airspace opacity. Mild cardiomegaly. Subcutaneous emphysema about the left chest wall. IMPRESSION: 1. No significant change in examination status post left thoracotomy with left-sided chest tubes in position and no significant pneumothorax. 2. Very low volume examination with mild diffuse interstitial opacity, likely bronchovascular crowding. No new airspace  opacity. Electronically Signed   By: Eddie Candle M.D.   On: 01/15/2019 09:19   Dg Abd 1 View  Result Date: 01/17/2019 CLINICAL DATA:  Abdominal distention. EXAM: ABDOMEN - 1 VIEW COMPARISON:   01/16/2019 FINDINGS: Moderate distension of the stomach is again noted. There is persistent gaseous distension of the colon. Decreased stool burden noted on today's exam. IMPRESSION: Persistent gaseous distension of the large bowel loops with interval decrease in overall stool burden. Electronically Signed   By: Kerby Moors M.D.   On: 01/17/2019 06:05   Dg Chest Port 1 View  Result Date: 01/18/2019 CLINICAL DATA:  Chest tube in place EXAM: PORTABLE CHEST 1 VIEW COMPARISON:  Two days ago FINDINGS: Low volume chest with interval lobar collapse at the right base. Stable heart size accentuated by low volumes. No edema, effusion, or pneumothorax. Large bore chest tube at the left base. IMPRESSION: Interval lobar collapse at the right base.  Lung volumes are low. No visible pneumothorax. Electronically Signed   By: Monte Fantasia M.D.   On: 01/18/2019 06:53   Dg Chest Port 1 View  Result Date: 01/14/2019 CLINICAL DATA:  Postop thoracotomy, left. EXAM: PORTABLE CHEST 1 VIEW COMPARISON:  Preoperative radiograph 12/16/2018, CT 11/17/2018 FINDINGS: Two left-sided chest tubes in place, 1 directed towards the apex, 1 towards the base. No definite visualized pneumothorax. Subcutaneous emphysema about the left lateral chest wall with adjacent skin staples. Lung volumes are low. Upper normal heart size, accentuated by low lung volumes. Bronchovascular crowding without pulmonary edema. No focal abnormality in the right lung. IMPRESSION: 1. Two left-sided chest tubes in place. No definite pneumothorax. Subcutaneous emphysema about the left lateral chest wall. 2. Low lung volumes with bronchovascular crowding. Electronically Signed   By: Keith Rake M.D.   On: 01/14/2019 17:59   Dg Abd Portable 2v  Result Date: 01/16/2019 CLINICAL DATA:  Short of breath. Chest tube. EXAM: PORTABLE ABDOMEN - 2 VIEW COMPARISON:  10/30/2017 FINDINGS: Moderate distension of the stomach. There is diffuse gaseous distension of the  colon with a moderate stool burden identified within the right colon and descending colon. No significant small bowel dilatation identified. Previous left hip arthroplasty and anterior disc fusion of the lower lumbar spine. IMPRESSION: 1. Moderate gaseous distension of the, nonspecific. This may reflect colonic ileus versus distal bowel obstruction. Clinical correlation advised. 2. Moderate stool burden within the right colon and descending colon. Electronically Signed   By: Kerby Moors M.D.   On: 01/16/2019 05:44     Antimicrobials:   None currently   Subjective: Patient with continued improvement  Objective: Vitals:   01/18/19 0700 01/18/19 0800 01/18/19 0900 01/18/19 1000  BP: (!) 128/101 (!) 137/99 130/85 128/87  Pulse: 82 74 74 74  Resp: 13 14 18 12   Temp:  98.4 F (36.9 C)    TempSrc:  Oral    SpO2: 96% 99% 98% 99%  Weight:      Height:        Intake/Output Summary (Last 24 hours) at 01/18/2019 1051 Last data filed at 01/18/2019 0841 Gross per 24 hour  Intake 840 ml  Output 1850 ml  Net -1010 ml   Filed Weights   01/14/19 1112  Weight: 106.4 kg    Examination:  General exam: Appears calm and comfortable  Respiratory system: Clear to auscultation. Respiratory effort normal. Cardiovascular system: Normal sinus rhythm, rate controlled gastrointestinal system: Abdomen is nondistended, soft and nontender. No organomegaly or masses felt. Normal bowel sounds heard. Central  nervous system: Alert and oriented. No focal neurological deficits. Extremities: Warm well perfused,  Skin: Left axillary incision with staples no dehiscence no purulence, chest tubes in place without obvious drainage around chest tube site Psychiatry: Judgement and insight appear normal. Mood & affect appropriate.     Data Reviewed: I have personally reviewed following labs and imaging studies  CBC: Recent Labs  Lab 01/14/19 1847 01/15/19 0306 01/16/19 0424 01/17/19 0701 01/18/19 0439   WBC 17.4* 13.7* 17.6* 15.1* 13.0*  NEUTROABS  --  11.6* 12.3*  --   --   HGB 13.7 13.2 14.7 13.6 13.5  HCT 41.4 39.7 46.4 43.2 40.9  MCV 88.3 88.8 91.0 92.5 89.1  PLT 218 203 252 251 700   Basic Metabolic Panel: Recent Labs  Lab 01/14/19 2021 01/15/19 0306 01/16/19 0424 01/16/19 0556 01/17/19 0701  NA 137 137 138  --  135  K 4.4 4.3 4.2  --  3.7  CL 102 102 100  --  99  CO2 25 26 29   --  26  GLUCOSE 217* 193* 156*  --  176*  BUN 14 14 15   --  25*  CREATININE 0.97 0.97 0.94  --  0.87  CALCIUM 8.4* 8.3* 8.9  --  8.6*  MG  --  1.9  --  2.1 2.5*  PHOS  --   --   --  3.2 3.1   GFR: Estimated Creatinine Clearance: 115.9 mL/min (by C-G formula based on SCr of 0.87 mg/dL). Liver Function Tests: Recent Labs  Lab 01/16/19 0556 01/17/19 0701  AST 54* 31  ALT 59* 43  ALKPHOS 103 92  BILITOT 1.2 1.1  PROT 7.9 7.2  ALBUMIN 4.3 3.7   No results for input(s): LIPASE, AMYLASE in the last 168 hours. No results for input(s): AMMONIA in the last 168 hours. Coagulation Profile: No results for input(s): INR, PROTIME in the last 168 hours. Cardiac Enzymes: No results for input(s): CKTOTAL, CKMB, CKMBINDEX, TROPONINI in the last 168 hours. BNP (last 3 results) No results for input(s): PROBNP in the last 8760 hours. HbA1C: Recent Labs    01/16/19 0424  HGBA1C 7.5*   CBG: Recent Labs  Lab 01/17/19 0739 01/17/19 1230 01/17/19 1615 01/17/19 2207 01/18/19 0743  GLUCAP 169* 206* 125* 139* 136*   Lipid Profile: No results for input(s): CHOL, HDL, LDLCALC, TRIG, CHOLHDL, LDLDIRECT in the last 72 hours. Thyroid Function Tests: No results for input(s): TSH, T4TOTAL, FREET4, T3FREE, THYROIDAB in the last 72 hours. Anemia Panel: No results for input(s): VITAMINB12, FOLATE, FERRITIN, TIBC, IRON, RETICCTPCT in the last 72 hours. Sepsis Labs: Recent Labs  Lab 01/16/19 0424 01/16/19 0711 01/16/19 1000 01/17/19 0701 01/18/19 0439  PROCALCITON 0.32  --   --  0.25 0.10   LATICACIDVEN  --  2.1* 1.7  --   --     Recent Results (from the past 240 hour(s))  SARS CORONAVIRUS 2 (TAT 6-24 HRS) Nasopharyngeal Nasopharyngeal Swab     Status: None   Collection Time: 01/11/19 10:19 AM   Specimen: Nasopharyngeal Swab  Result Value Ref Range Status   SARS Coronavirus 2 NEGATIVE NEGATIVE Final    Comment: (NOTE) SARS-CoV-2 target nucleic acids are NOT DETECTED. The SARS-CoV-2 RNA is generally detectable in upper and lower respiratory specimens during the acute phase of infection. Negative results do not preclude SARS-CoV-2 infection, do not rule out co-infections with other pathogens, and should not be used as the sole basis for treatment or other patient management decisions. Negative  results must be combined with clinical observations, patient history, and epidemiological information. The expected result is Negative. Fact Sheet for Patients: SugarRoll.be Fact Sheet for Healthcare Providers: https://www.woods-mathews.com/ This test is not yet approved or cleared by the [place] FDA and  has been authorized for detection and/or diagnosis of SARS-CoV-2 by FDA under an Emergency Use Authorization (EUA). This EUA will remain  in effect (meaning this test can be used) for the duration of the COVID-19 declaration under Section 56 4(b)(1) of the Act, 21 U.S.C. section 360bbb-3(b)(1), unless the authorization is terminated or revoked sooner. Performed at [facility], [place] [address], [place], [place] [address]   MRSA PCR Screening     Status: None   Collection Time: 01/14/19  9:50 PM   Specimen: Nasal Mucosa; Nasopharyngeal  Result Value Ref Range Status   MRSA by PCR NEGATIVE NEGATIVE Final    Comment:        The GeneXpert MRSA Assay (FDA approved for NASAL specimens only), is one component of a comprehensive MRSA colonization surveillance program. It is not intended to diagnose MRSA infection nor to  guide or monitor treatment for MRSA infections. Performed at [facility], [address] [place], [place] [address]          Radiology Studies: Dg Abd 1 View  Result Date: 01/17/2019 CLINICAL DATA:  Abdominal distention. EXAM: ABDOMEN - 1 VIEW COMPARISON:  01/16/2019 FINDINGS: Moderate distension of the stomach is again noted. There is persistent gaseous distension of the colon. Decreased stool burden noted on today's exam. IMPRESSION: Persistent gaseous distension of the large bowel loops with interval decrease in overall stool burden. Electronically Signed   By: Kerby Moors M.D.   On: 01/17/2019 06:05   Dg Chest Port 1 View  Result Date: 01/18/2019 CLINICAL DATA:  Chest tube in place EXAM: PORTABLE CHEST 1 VIEW COMPARISON:  Two days ago FINDINGS: Low volume chest with interval lobar collapse at the right base. Stable heart size accentuated by low volumes. No edema, effusion, or pneumothorax. Large bore chest tube at the left base. IMPRESSION: Interval lobar collapse at the right base.  Lung volumes are low. No visible pneumothorax. Electronically Signed   By: Monte Fantasia M.D.   On: 01/18/2019 06:53        Scheduled Meds: . albuterol  2.5 mg Nebulization Q6H  . atorvastatin  10 mg Oral Daily  . bisacodyl  10 mg Oral Daily  . Chlorhexidine Gluconate Cloth  6 each Topical Daily  . enoxaparin (LOVENOX) injection  40 mg Subcutaneous Q24H  . hydrALAZINE  100 mg Oral BID  . insulin aspart  0-5 Units Subcutaneous QHS  . insulin aspart  0-9 Units Subcutaneous TID WC  . ipratropium  0.5 mg Nebulization Q6H  . mouth rinse  15 mL Mouth Rinse BID  . metFORMIN  1,000 mg Oral BID WC  . methocarbamol  750 mg Oral QID  . metoprolol succinate  125 mg Oral BID  . montelukast  10 mg Oral QHS  . nicotine  21 mg Transdermal Daily  . nortriptyline  50 mg Oral QHS  . oxyCODONE  10 mg Oral QID  . pantoprazole  40 mg Oral Daily  . polyethylene glycol  17 g Oral Daily  .  sodium phosphate  1 enema Rectal Once   Continuous Infusions: . sodium chloride       LOS: 4 days    Time spent: 35 min    Nicolette Bang, MD [facility]  If 7PM-7AM, please contact night-coverage  01/18/2019, 10:51 AM

## 2019-01-18 NOTE — Progress Notes (Signed)
Pt transferred to RM # [id number] at this time. VSS prior to transfer. Report given to RN on 2C.

## 2019-01-18 NOTE — Progress Notes (Signed)
Remains in normal sinus rhythm  Not short of breath   No air leak  Wounds redressed.  All look good. Lungs with decreased breath sounds bilaterally Heart regular  CXRAy from this morning shows RLL atelectasis  Needs to be out of bed and walk Needs Chest PT and incentive spirometry Will transfer to floor Cont all meds

## 2019-01-18 NOTE — Progress Notes (Signed)
CRITICAL CARE PROGRESS NOTE    Name: Kristopher [name] MRN: [id number] DOB: 06-18-1960     LOS: 4   SUBJECTIVE FINDINGS & SIGNIFICANT EVENTS   Patient description:   This is a 58 yo male with a PMH Migraines, Former Tobacco Abuse, HTN, GERD, Dyspnea, Type II Diabetes Mellitus, Chronic Back Pain, Benign Hypertensive Kidney Disease, Arthritis, and Allergies. He presented to [facility] on 11/12 to undergo a bronchoscopy, left thoracotomy with left lower lobectomy, and insertion of 2 left sided chest tubes due to non-small cell carcinoma of left lung favoring squamous cell.  He was subsequently admitted to ICU postop for additional workup and treatment. Was stabilized and transferred out of the ICU. A rapid response called this morning for worsening dyspnea, left chest pain and afib with RVR .  His heart rate is sustaining in the 140s and 150s despite 5 mg of metoprolol and 150 mg of amiodarone.  He does not have a history of atrial fibrillation.  He denies chest pain, but reports worsening left-sided chest wall pain mostly at the chest tube insertion site.  His abdomen is very distended but he states that that is his baseline.  He reports irregular bowel movement since hospitalization.  SIGNIFICANT EVENTS/STUDIES:  11/12: Pt admitted to ICU post left thoracotomy with left lower lobectomy  11/13/202: Transferred out of the ICU 11/14.2020: Returned transfer to MICU due to Afib with RVR, worsening left chest pain and acute pulmonary edema 11/15 - improved , no BM x2d patient states he has not eaten anything solid in some time 11/6 massive BM's noted in last 24 hrs   CC follow up SOB  HPI Resting comfortably Abs less distended Alert and awake Feels better  Lines / Drains: -piv x2  -Chest tube left hemithorax- minimal  active drain no leak on forced expiration  Cultures / Sepsis markers: -PCT 0.25 -  Antibiotics: none   Protocols / Consultants: Surgery  CC   SURGICAL HISTORY   Past Surgical History:  Procedure Laterality Date  . APPENDECTOMY    . BACK SURGERY    . COLONOSCOPY WITH PROPOFOL N/A 02/19/2016   Procedure: COLONOSCOPY WITH PROPOFOL;  Surgeon: Lucilla Lame, MD;  Location: [place];  Service: Endoscopy;  Laterality: N/A;  . COLONOSCOPY WITH PROPOFOL N/A 01/19/2018   Procedure: COLONOSCOPY WITH PROPOFOL;  Surgeon: Lucilla Lame, MD;  Location: [place];  Service: Endoscopy;  Laterality: N/A;  Diabetic - oral meds  . DG OPERATIVE LEFT HIP ([place] HX)     10/19  . ELECTROMAGNETIC NAVIGATION BROCHOSCOPY Left 11/18/2018   Procedure: ELECTROMAGNETIC NAVIGATION BRONCHOSCOPY;  Surgeon: Tyler Pita, MD;  Location: [facility];  Service: Cardiopulmonary;  Laterality: Left;  . FOOT SURGERY Left    Screws and plates  . JOINT REPLACEMENT Left 12/2017   DR Rudene Christians Hip  . KNEE SURGERY Left    X 2  . LEG SURGERY    . POLYPECTOMY N/A 02/19/2016   Procedure: POLYPECTOMY;  Surgeon: Lucilla Lame, MD;  Location: [place];  Service: Endoscopy;  Laterality: N/A;  . POLYPECTOMY  01/19/2018   Procedure: POLYPECTOMY;  Surgeon: Lucilla Lame, MD;  Location: [place];  Service: Endoscopy;;  . THORACOTOMY Left 01/14/2019   Procedure: THORACOTOMY MAJOR, LEFT;  Surgeon: Nestor Lewandowsky, MD;  Location: [facility];  Service: General;  Laterality: Left;  . TOTAL HIP ARTHROPLASTY Left 12/02/2017   Procedure: TOTAL HIP ARTHROPLASTY ANTERIOR APPROACH;  Surgeon: Hessie Knows, MD;  Location: [facility];  Service:  Orthopedics;  Laterality: Left;  [name] VIDEO BRONCHOSCOPY Left 01/14/2019   Procedure: VIDEO BRONCHOSCOPY WITH FLUORO, LEFT;  Surgeon: Nestor Lewandowsky, MD;  Location: [facility];  Service: General;  Laterality: Left;      MEDICATIONS   Current Medication:  Current  Facility-Administered Medications:  .  Place/Maintain arterial line, , , Until Discontinued **AND** 0.9 %  sodium chloride infusion, , Intra-arterial, PRN, Nestor Lewandowsky, MD .  albuterol (PROVENTIL) (2.5 MG/3ML) 0.083% nebulizer solution 2.5 mg, 2.5 mg, Nebulization, Q6H, Tukov-Yual, Magdalene S, NP, 2.5 mg at 01/18/19 0750 .  atorvastatin (LIPITOR) tablet 10 mg, 10 mg, Oral, Daily, Nestor Lewandowsky, MD, 10 mg at 01/17/19 0926 .  bisacodyl (DULCOLAX) EC tablet 10 mg, 10 mg, Oral, Daily, Nestor Lewandowsky, MD, 10 mg at 01/17/19 0927 .  Chlorhexidine Gluconate Cloth 2 % PADS 6 each, 6 each, Topical, Daily, Awilda Bill, NP, 6 each at 01/17/19 1130 .  enoxaparin (LOVENOX) injection 40 mg, 40 mg, Subcutaneous, Q24H, Tylene Fantasia, PA-C, 40 mg at 01/18/19 7619 .  hydrALAZINE (APRESOLINE) injection 10 mg, 10 mg, Intravenous, Q6H PRN, Tukov-Yual, Magdalene S, NP .  hydrALAZINE (APRESOLINE) tablet 100 mg, 100 mg, Oral, BID, Nestor Lewandowsky, MD, 100 mg at 01/17/19 2100 .  insulin aspart (novoLOG) injection 0-5 Units, 0-5 Units, Subcutaneous, QHS, [place] Ree, Zachary R, PA-C .  insulin aspart (novoLOG) injection 0-9 Units, 0-9 Units, Subcutaneous, TID WC, Tylene Fantasia, PA-C, 1 Units at 01/18/19 5093 .  ipratropium (ATROVENT) nebulizer solution 0.5 mg, 0.5 mg, Nebulization, Q6H, Tukov-Yual, Magdalene S, NP, 0.5 mg at 01/18/19 0750 .  MEDLINE mouth rinse, 15 mL, Mouth Rinse, BID, Tukov-Yual, Magdalene S, NP, 15 mL at 01/17/19 2101 .  metFORMIN (GLUCOPHAGE-XR) 24 hr tablet 1,000 mg, 1,000 mg, Oral, BID WC, Nestor Lewandowsky, MD, 1,000 mg at 01/18/19 0806 .  methocarbamol (ROBAXIN) tablet 750 mg, 750 mg, Oral, QID, Nestor Lewandowsky, MD, 750 mg at 01/17/19 2050 .  metoprolol succinate (TOPROL-XL) 24 hr tablet 125 mg, 125 mg, Oral, BID, Dhungel, Nishant, MD, 125 mg at 01/17/19 2101 .  montelukast (SINGULAIR) tablet 10 mg, 10 mg, Oral, QHS, Oaks, Christia Reading, MD, 10 mg at 01/17/19 2100 .  morphine 2 MG/ML injection 1-2 mg, 1-2  mg, Intravenous, Q1H PRN, Nestor Lewandowsky, MD, 2 mg at 01/16/19 2150 .  nicotine (NICODERM CQ - dosed in mg/24 hours) patch 21 mg, 21 mg, Transdermal, Daily, Oaks, [place], MD .  nicotine polacrilex (NICORETTE) gum 2 mg, 2 mg, Oral, PRN, Nestor Lewandowsky, MD .  nortriptyline (PAMELOR) capsule 50 mg, 50 mg, Oral, QHS, Oaks, Christia Reading, MD, 50 mg at 01/17/19 2101 .  ondansetron (ZOFRAN) injection 4 mg, 4 mg, Intravenous, Q6H PRN, Nestor Lewandowsky, MD, 4 mg at 01/16/19 1335 .  oxyCODONE (Oxy IR/ROXICODONE) immediate release tablet 10 mg, 10 mg, Oral, QID, Awilda Bill, NP, 10 mg at 01/17/19 2049 .  pantoprazole (PROTONIX) EC tablet 40 mg, 40 mg, Oral, Daily, Nestor Lewandowsky, MD, 40 mg at 01/17/19 2671 .  polyethylene glycol (MIRALAX / GLYCOLAX) packet 17 g, 17 g, Oral, Daily, Tukov-Yual, Magdalene S, NP .  sodium phosphate (FLEET) 7-19 GM/118ML enema 1 enema, 1 enema, Rectal, Once, Tukov-Yual, Magdalene S, NP    Review of Systems:  Gen:  Denies  fever, sweats, chills weight loss  HEENT: Denies blurred vision, double vision, ear pain, eye pain, hearing loss, nose bleeds, sore throat Cardiac:  No dizziness, chest pain or heaviness, chest tightness,edema, No JVD Resp:   No cough, -sputum production, -shortness  of breath,-wheezing, -hemoptysis,  Gi: Denies swallowing difficulty, stomach pain, nausea or vomiting, diarrhea, constipation, bowel incontinence Gu:  Denies bladder incontinence, burning urine Ext:   Denies Joint pain, stiffness or swelling Skin: Denies  skin rash, easy bruising or bleeding or hives Endoc:  Denies polyuria, polydipsia , polyphagia or weight change Psych:   Denies depression, insomnia or hallucinations  Other:  All other systems negative   10 point ROS conducted and is as per subjective findings, specifically no chest pain no abdominal pain  PHYSICAL EXAMINATION   Vital Signs: Temp:  [98.1 F (36.7 C)-98.4 F (36.9 C)] 98.4 F (36.9 C) (11/16 0800) Pulse Rate:  [72-96] 74  (11/16 0800) Resp:  [10-20] 14 (11/16 0800) BP: (119-137)/(76-101) 137/99 (11/16 0800) SpO2:  [94 %-99 %] 99 % (11/16 0800)  Physical Examination:   GENERAL:NAD, no fevers, chills, no weakness no fatigue HEAD: Normocephalic, atraumatic.  EYES: PERLA, EOMI No scleral icterus.  NECK: Supple. No thyromegaly.  No JVD.  PULMONARY: CTA B/L no wheezing, rhonchi, crackles CARDIOVASCULAR: S1 and S2. Regular rate and rhythm. No murmurs GASTROINTESTINAL: Soft, nontender, nondistended. Positive bowel sounds.  MUSCULOSKELETAL: No swelling, clubbing, or edema.  NEUROLOGIC: No gross focal neurological deficits. 5/5 strength all extremities SKIN: No ulceration, lesions, rashes, or cyanosis.  PSYCHIATRIC: Insight, judgment intact. -depression -anxiety ALL OTHER ROS ARE NEGATIVE       PERTINENT DATA     Infusions: . sodium chloride     Scheduled Medications: . albuterol  2.5 mg Nebulization Q6H  . atorvastatin  10 mg Oral Daily  . bisacodyl  10 mg Oral Daily  . Chlorhexidine Gluconate Cloth  6 each Topical Daily  . enoxaparin (LOVENOX) injection  40 mg Subcutaneous Q24H  . hydrALAZINE  100 mg Oral BID  . insulin aspart  0-5 Units Subcutaneous QHS  . insulin aspart  0-9 Units Subcutaneous TID WC  . ipratropium  0.5 mg Nebulization Q6H  . mouth rinse  15 mL Mouth Rinse BID  . metFORMIN  1,000 mg Oral BID WC  . methocarbamol  750 mg Oral QID  . metoprolol succinate  125 mg Oral BID  . montelukast  10 mg Oral QHS  . nicotine  21 mg Transdermal Daily  . nortriptyline  50 mg Oral QHS  . oxyCODONE  10 mg Oral QID  . pantoprazole  40 mg Oral Daily  . polyethylene glycol  17 g Oral Daily  . sodium phosphate  1 enema Rectal Once   PRN Medications: Place/Maintain arterial line **AND** sodium chloride, hydrALAZINE, morphine injection, nicotine polacrilex, ondansetron (ZOFRAN) IV Hemodynamic parameters:   Intake/Output: 11/15 0701 - 11/16 0700 In: 840 [P.O.:840] Out: 2050 [Urine:1700;  Chest Tube:350]  Ventilator  Settings:       LAB RESULTS:  Basic Metabolic Panel: Recent Labs  Lab 01/14/19 2021 01/15/19 0306 01/16/19 0424 01/16/19 0556 01/17/19 0701  NA 137 137 138  --  135  K 4.4 4.3 4.2  --  3.7  CL 102 102 100  --  99  CO2 25 26 29   --  26  GLUCOSE 217* 193* 156*  --  176*  BUN 14 14 15   --  25*  CREATININE 0.97 0.97 0.94  --  0.87  CALCIUM 8.4* 8.3* 8.9  --  8.6*  MG  --  1.9  --  2.1 2.5*  PHOS  --   --   --  3.2 3.1   Liver Function Tests: Recent Labs  Lab 01/16/19 0556 01/17/19  0701  AST 54* 31  ALT 59* 43  ALKPHOS 103 92  BILITOT 1.2 1.1  PROT 7.9 7.2  ALBUMIN 4.3 3.7   No results for input(s): LIPASE, AMYLASE in the last 168 hours. No results for input(s): AMMONIA in the last 168 hours. CBC: Recent Labs  Lab 01/14/19 1847 01/15/19 0306 01/16/19 0424 01/17/19 0701 01/18/19 0439  WBC 17.4* 13.7* 17.6* 15.1* 13.0*  NEUTROABS  --  11.6* 12.3*  --   --   HGB 13.7 13.2 14.7 13.6 13.5  HCT 41.4 39.7 46.4 43.2 40.9  MCV 88.3 88.8 91.0 92.5 89.1  PLT 218 203 252 251 257   Cardiac Enzymes: No results for input(s): CKTOTAL, CKMB, CKMBINDEX, TROPONINI in the last 168 hours. BNP: Invalid input(s): POCBNP CBG: Recent Labs  Lab 01/17/19 0739 01/17/19 1230 01/17/19 1615 01/17/19 2207 01/18/19 0743  GLUCAP 169* 206* 125* 139* 136*      ASSESSMENT AND PLAN   AFIB WITH RVR Follow up cardiology recs stable rhythm On beta blocker   POST thoracotomy Sq cell lung cancer Follow up Dr Genevive Bi recs   Severe CONSTIPATION Significant BM's noted SOB resolved  ELECTROLYTES -follow labs as needed -replace as needed -pharmacy consultation and following    DVT/GI PRX ordered TRANSFUSIONS AS NEEDED MONITOR FSBS ASSESS the need for LABS as needed   Step Down status consider Gen med floor transfer    Corrin Parker, M.D.  [facility]  Medical Director [place]  Director [facility] Cardio-Pulmonary Department

## 2019-01-18 NOTE — Progress Notes (Signed)
[facility] Cardiology  SUBJECTIVE: Kristopher [name] is a 58 year old male s/p lobectomy for lung mass.  Went into atrial fibrillation following procedure and has since converted to sinus rhythm.  Currently denies chest pain or recent episodes of palpitations/heart racing sensation.    Vitals:   01/18/19 0700 01/18/19 0800 01/18/19 0900 01/18/19 1000  BP: (!) 128/101 (!) 137/99 130/85 128/87  Pulse: 82 74 74 74  Resp: 13 14 18 12   Temp:  98.4 F (36.9 C)    TempSrc:  Oral    SpO2: 96% 99% 98% 99%  Weight:      Height:         Intake/Output Summary (Last 24 hours) at 01/18/2019 1037 Last data filed at 01/18/2019 0841 Gross per 24 hour  Intake 840 ml  Output 1850 ml  Net -1010 ml      PHYSICAL EXAM  General: Well developed, well nourished, in no acute distress HEENT:  Normocephalic and atramatic Neck:  No JVD.  Lungs: Clear bilaterally to auscultation and percussion. Heart: HRRR . Normal S1 and S2 without gallops or murmurs.  Abdomen: Bowel sounds are positive, abdomen soft and non-tender  Msk:  Back normal, normal gait. Normal strength and tone for age. Extremities: No clubbing, cyanosis or edema.   Neuro: Alert and oriented X 3. Psych:  Good affect, responds appropriately   LABS: Basic Metabolic Panel: Recent Labs    01/16/19 0424 01/16/19 0556 01/17/19 0701  NA 138  --  135  K 4.2  --  3.7  CL 100  --  99  CO2 29  --  26  GLUCOSE 156*  --  176*  BUN 15  --  25*  CREATININE 0.94  --  0.87  CALCIUM 8.9  --  8.6*  MG  --  2.1 2.5*  PHOS  --  3.2 3.1   Liver Function Tests: Recent Labs    01/16/19 0556 01/17/19 0701  AST 54* 31  ALT 59* 43  ALKPHOS 103 92  BILITOT 1.2 1.1  PROT 7.9 7.2  ALBUMIN 4.3 3.7   No results for input(s): LIPASE, AMYLASE in the last 72 hours. CBC: Recent Labs    01/16/19 0424 01/17/19 0701 01/18/19 0439  WBC 17.6* 15.1* 13.0*  NEUTROABS 12.3*  --   --   HGB 14.7 13.6 13.5  HCT 46.4 43.2 40.9  MCV 91.0 92.5 89.1  PLT 252 251  257   Cardiac Enzymes: No results for input(s): CKTOTAL, CKMB, CKMBINDEX, TROPONINI in the last 72 hours. BNP: Invalid input(s): POCBNP D-Dimer: No results for input(s): DDIMER in the last 72 hours. Hemoglobin A1C: Recent Labs    01/16/19 0424  HGBA1C 7.5*   Fasting Lipid Panel: No results for input(s): CHOL, HDL, LDLCALC, TRIG, CHOLHDL, LDLDIRECT in the last 72 hours. Thyroid Function Tests: No results for input(s): TSH, T4TOTAL, T3FREE, THYROIDAB in the last 72 hours.  Invalid input(s): FREET3 Anemia Panel: No results for input(s): VITAMINB12, FOLATE, FERRITIN, TIBC, IRON, RETICCTPCT in the last 72 hours.  Dg Abd 1 View  Result Date: 01/17/2019 CLINICAL DATA:  Abdominal distention. EXAM: ABDOMEN - 1 VIEW COMPARISON:  01/16/2019 FINDINGS: Moderate distension of the stomach is again noted. There is persistent gaseous distension of the colon. Decreased stool burden noted on today's exam. IMPRESSION: Persistent gaseous distension of the large bowel loops with interval decrease in overall stool burden. Electronically Signed   By: Kerby Moors M.D.   On: 01/17/2019 06:05   Dg Chest Port 1  View  Result Date: 01/18/2019 CLINICAL DATA:  Chest tube in place EXAM: PORTABLE CHEST 1 VIEW COMPARISON:  Two days ago FINDINGS: Low volume chest with interval lobar collapse at the right base. Stable heart size accentuated by low volumes. No edema, effusion, or pneumothorax. Large bore chest tube at the left base. IMPRESSION: Interval lobar collapse at the right base.  Lung volumes are low. No visible pneumothorax. Electronically Signed   By: Monte Fantasia M.D.   On: 01/18/2019 06:53     Echo:  Normal LV systolic function with an EF estimated between 55-60% with moderate LVH, grade 1 diastolic dysfunction.  Mild TR.   TELEMETRY: Normal sinus rhythm   ASSESSMENT AND PLAN:  Active Problems:   Lung mass   Atrial fibrillation with RVR (HCC)   Shortness of breath   Acute respiratory failure  with hypoxia (HCC)    1.  Atrial fibrillation with RVR   -S/p lobectomy, currently in sinus rhythm with no significant echocardiogram abnormalities   -Continue metoprolol 125mg  twice daily   The history, physical exam findings, and plan of care were all discussed with Dr. Bartholome Bill, and all decision making was made in collaboration.   Avie Arenas  PA-C 01/18/2019 10:37 AM

## 2019-01-18 NOTE — Progress Notes (Signed)
PT Cancellation Note  Patient Details Name: Kristopher [name] MRN: [id number] DOB: [birth year]   Cancelled Treatment:    Reason Eval/Treat Not Completed: Other (comment)(Per chart review, pt transferred to CCU due to afib with RVR. Per PT guidelines, PT to complete PT orders due to change in medical status. Please re-consult when patient is appropriate for exertional activity/medically stable.)   Lieutenant Diego PT, DPT 8:27 AM,01/18/19 [phone]

## 2019-01-18 NOTE — Evaluation (Signed)
Physical Therapy Re-Evaluation Patient Details Name: Kristopher [name] MRN: [id number] DOB: Oct 05, 1960 Today's Date: 01/18/2019   History of Present Illness  Pt is 58 yo male that presented to [facility] on 11/12 to undergo a bronchoscopy, left thoracotomy with left lower lobectomy, and insertion of 2 left sided chest tubes due to non-small cell carcinoma of left lung favoring squamous cell.  He was subsequently admitted to ICU, transferred to floor. Returned to CCU 11/14 for afib with RVR. PMH Migraines, Former Tobacco Abuse, HTN, GERD, Dyspnea, Type II Diabetes Mellitus, Chronic Back Pain, Benign Hypertensive Kidney Disease, Arthritis, and Allergies.    Clinical Impression  Pt in alert, in chair, just finished breathing treatment with respiratory therapist. Family at bedside. Pt seen as re-evaluation due to transfer to CCU for afib and RVR 11/14. Pt complained of light headedness throughout session. Vitals monitored very closely; HR WFLs (80s-90s BPM), BP WFLs, and spO2 occasional desaturated to mid 80s with exertion/conversation on 2L. Pt able to recover with rest and cues for PLB. The patient demonstrated transfers with supervision and ambulated ~52ft in room with CGA. The patient up in chair at end of session, RN at bedside to assess pt's complaints of lightheadedness (vitals WFLs). The patient would benefit from further skilled PT intervention to return to PLOF. Goals still appropriate and carried forward at this time.     Follow Up Recommendations Home health PT;Supervision - Intermittent    Equipment Recommendations  None recommended by PT    Recommendations for Other Services       Precautions / Restrictions Precautions Precautions: Fall Precaution Comments: chest tubes, watch BP Restrictions Weight Bearing Restrictions: No      Mobility  Bed Mobility               General bed mobility comments: deferred pt up in chair  Transfers Overall transfer level: Needs  assistance Equipment used: None Transfers: Sit to/from Stand Sit to Stand: Supervision         General transfer comment: uses IV pole for initial standing balance  Ambulation/Gait Ambulation/Gait assistance: Min guard Gait Distance (Feet): 24 Feet Assistive device: IV Pole Gait Pattern/deviations: Wide base of support;Step-through pattern;Shuffle Gait velocity: decreased   General Gait Details: Pt utilized IV pole for comfort, not required  Stairs            Wheelchair Mobility    Modified Rankin (Stroke Patients Only)       Balance Overall balance assessment: Needs assistance Sitting-balance support: Feet supported Sitting balance-Leahy Scale: Good       Standing balance-Leahy Scale: Good                               Pertinent Vitals/Pain Pain Assessment: No/denies pain    Home Living Family/patient expects to be discharged to:: Private residence Living Arrangements: Spouse/significant other;Other relatives(grandchild) Available Help at Discharge: Family Type of Home: House Home Access: Ramped entrance     Home Layout: One level Home Equipment: [place] - 2 wheels;Cane - single point      Prior Function Level of Independence: Independent               Hand Dominance        Extremity/Trunk Assessment   Upper Extremity Assessment Upper Extremity Assessment: Overall WFL for tasks assessed(official MMT testing deferred due to pain/chest tube)    Lower Extremity Assessment Lower Extremity Assessment: Overall WFL for tasks assessed  Cervical / Trunk Assessment Cervical / Trunk Exceptions: chest tube on L, staples in place L thoracic s/p thoracotomy  Communication   Communication: No difficulties  Cognition Arousal/Alertness: Awake/alert Behavior During Therapy: WFL for tasks assessed/performed Overall Cognitive Status: Within Functional Limits for tasks assessed                                         General Comments      Exercises Other Exercises Other Exercises: Pt instructed in PLB  and splinting during coughing/clearing throat. Other Exercises: static standing ~73min, marching in place ~97min. seated rest break prior to ambulation to assess vitals/activity tolerance.   Assessment/Plan    PT Assessment Patient needs continued PT services  PT Problem List Decreased strength;Decreased mobility;Decreased activity tolerance;Decreased balance;Pain       PT Treatment Interventions DME instruction;Therapeutic exercise;Gait training;Balance training;Neuromuscular re-education;Functional mobility training;Therapeutic activities;Patient/family education    PT Goals (Current goals can be found in the Care Plan section)  Acute Rehab PT Goals Patient Stated Goal: to go home PT Goal Formulation: With patient Time For Goal Achievement: 02/01/19 Potential to Achieve Goals: Good    Frequency Min 2X/week   Barriers to discharge        Co-evaluation               AM-PAC PT "6 Clicks" Mobility  Outcome Measure Help needed turning from your back to your side while in a flat bed without using bedrails?: A Little Help needed moving from lying on your back to sitting on the side of a flat bed without using bedrails?: A Little Help needed moving to and from a bed to a chair (including a wheelchair)?: A Little Help needed standing up from a chair using your arms (e.g., wheelchair or bedside chair)?: A Little Help needed to walk in hospital room?: A Little Help needed climbing 3-5 steps with a railing? : A Little 6 Click Score: 18    End of Session Equipment Utilized During Treatment: Gait belt Activity Tolerance: Patient tolerated treatment well Patient left: in chair;with call bell/phone within reach;with SCD's reapplied;with nursing/sitter in room;with family/visitor present Nurse Communication: Mobility status PT Visit Diagnosis: Other abnormalities of gait and mobility  (R26.89);Pain;Difficulty in walking, not elsewhere classified (R26.2) Pain - Right/Left: Left Pain - part of body: (chest/flank)    Time: 2426-8341 PT Time Calculation (min) (ACUTE ONLY): 40 min   Charges:   PT Evaluation $PT Re-evaluation: 1 Re-eval PT Treatments $Therapeutic Exercise: 23-37 mins        Lieutenant Diego PT, DPT 2:47 PM,01/18/19 [phone]

## 2019-01-18 NOTE — Progress Notes (Signed)
Patient had an episode where the chest tube site was leaking fluids and saturated through the dressing, two bed pads, and bed sheets. On call surgeon (Dr. Celine Ahr) called and updated about 0100. No new orders received just to replace the dressing. No further saturation episodes noted. Patient otherwise had an uneventful night.

## 2019-01-19 ENCOUNTER — Encounter: Payer: Self-pay | Admitting: Pain Medicine

## 2019-01-19 ENCOUNTER — Inpatient Hospital Stay: Payer: Medicare Other

## 2019-01-19 DIAGNOSIS — M79602 Pain in left arm: Secondary | ICD-10-CM | POA: Insufficient documentation

## 2019-01-19 DIAGNOSIS — G8929 Other chronic pain: Secondary | ICD-10-CM | POA: Insufficient documentation

## 2019-01-19 LAB — BASIC METABOLIC PANEL
Anion gap: 7 (ref 5–15)
BUN: 23 mg/dL — ABNORMAL HIGH (ref 6–20)
CO2: 27 mmol/L (ref 22–32)
Calcium: 8.3 mg/dL — ABNORMAL LOW (ref 8.9–10.3)
Chloride: 100 mmol/L (ref 98–111)
Creatinine, Ser: 0.97 mg/dL (ref 0.61–1.24)
GFR calc Af Amer: >60 mL/min (ref >60)
GFR calc non Af Amer: >60 mL/min (ref >60)
Glucose, Bld: 132 mg/dL — ABNORMAL HIGH (ref 70–99)
Potassium: 3.4 mmol/L — ABNORMAL LOW (ref 3.5–5.1)
Sodium: 134 mmol/L — ABNORMAL LOW (ref 135–145)

## 2019-01-19 LAB — GLUCOSE, CAPILLARY
Glucose-Capillary: 140 mg/dL — ABNORMAL HIGH (ref 70–99)
Glucose-Capillary: 92 mg/dL (ref 70–99)
Glucose-Capillary: 117 mg/dL — ABNORMAL HIGH (ref 70–99)
Glucose-Capillary: 114 mg/dL — ABNORMAL HIGH (ref 70–99)

## 2019-01-19 MED ORDER — ACETYLCYSTEINE 20 % IN SOLN
4.0000 mL | Freq: Three times a day (TID) | RESPIRATORY_TRACT | Status: DC
  Administered 2019-01-19 – 2019-01-20 (×3): 4 mL via RESPIRATORY_TRACT
  Filled 2019-01-19 (×6): qty 4

## 2019-01-19 MED ORDER — GUAIFENESIN ER 600 MG PO TB12
600.0000 mg | ORAL_TABLET | Freq: Two times a day (BID) | ORAL | Status: DC
  Administered 2019-01-19 – 2019-01-22 (×4): 600 mg via ORAL
  Filled 2019-01-19 (×6): qty 1

## 2019-01-19 MED ORDER — ACETYLCYSTEINE 20 % IN SOLN
4.0000 mL | RESPIRATORY_TRACT | Status: DC
  Filled 2019-01-19 (×2): qty 4

## 2019-01-19 MED ORDER — POTASSIUM CHLORIDE CRYS ER 20 MEQ PO TBCR
40.0000 meq | EXTENDED_RELEASE_TABLET | Freq: Once | ORAL | Status: AC
  Administered 2019-01-19: 40 meq via ORAL
  Filled 2019-01-19: qty 2

## 2019-01-19 NOTE — Progress Notes (Signed)
Pain Management Virtual Encounter Note - Virtual Visit via Telephone Telehealth (real-time audio visits between healthcare provider and patient).   Patient's Phone No. & Preferred Pharmacy:  [phone] (home); [phone] (mobile); (Preferred) [phone] No e-mail address on record  [id number] - [place], [address] [place] [address] Phone: [phone] Fax: [phone]  [place], [place] - [address] [place] [address] Phone: [phone] Fax: [phone]    Pre-screening note:  Our staff contacted Mr. Plasencia and offered him an "in person", "face-to-face" appointment versus a telephone encounter. He indicated preferring the telephone encounter, at this time.   Reason for Virtual Visit: COVID-19*  Social distancing based on CDC and AMA recommendations.   I contacted [name] on 01/20/2019 via telephone.      I clearly identified myself as Gaspar Cola, MD. I verified that I was speaking with the correct person using two identifiers (Name: ALBARO DEVINEY, and date of birth: August 07, 1960).  Advanced Informed Consent I sought verbal advanced consent from [name] for virtual visit interactions. I informed Mr. Ginger of possible security and privacy concerns, risks, and limitations associated with providing "not-in-person" medical evaluation and management services. I also informed Mr. Fehnel of the availability of "in-person" appointments. Finally, I informed him that there would be a charge for the virtual visit and that he could be  personally, fully or partially, financially responsible for it. [name] expressed understanding and agreed to proceed.   Historic Elements   Mr. DEONTRA PEREYRA is a 58 y.o. year old, male patient evaluated today after his last encounter by our practice on 12/28/2018. Mr. Confer  has a past medical history of Allergy, Arthritis, Benign  hypertensive kidney disease, Chronic back pain, Diabetes mellitus, type 2 ([place]), Dyspnea, GERD (gastroesophageal reflux disease), Hypertension, and Migraines. He also  has a past surgical history that includes Back surgery; Appendectomy; Leg Surgery; Knee surgery (Left); Foot surgery (Left); Colonoscopy with propofol (N/A, 02/19/2016); polypectomy (N/A, 02/19/2016); Total hip arthroplasty (Left, 12/02/2017); Colonoscopy with propofol (N/A, 01/19/2018); polypectomy (01/19/2018); DG OPERATIVE LEFT HIP ([place] HX); Joint replacement (Left, 12/2017); Electormagnetic navigation bronchoscopy (Left, 11/18/2018); Video bronchoscopy (Left, 01/14/2019); and Thoracotomy (Left, 01/14/2019). Mr. Blaney has a current medication list which includes the following prescription(s): accu-chek guide, albuterol, atorvastatin, esomeprazole, arnuity ellipta, hydralazine, metformin, metoprolol succinate, metoprolol succinate, montelukast, nortriptyline, potassium chloride er, sumatriptan, NONFORMULARY OR COMPOUNDED ITEM, oxycodone hcl, oxycodone hcl, and oxycodone hcl, and the following Facility-Administered Medications: acetylcysteine, atorvastatin, bisacodyl, chlorhexidine gluconate cloth, enoxaparin, guaifenesin, hydralazine, hydralazine, insulin aspart, insulin aspart, ipratropium-albuterol, mouth rinse, metformin, methocarbamol, metoprolol succinate (TOPROL-XL) 24 hr tablet 125 mg, montelukast, morphine, nicotine, nicotine polacrilex, nortriptyline, ondansetron, oxycodone, pantoprazole, polyethylene glycol, and senna-docusate. He  reports that he has quit smoking. He smoked 0.00 packs per day for 35.00 years. He has never used smokeless tobacco. He reports current drug use. Drug: Oxycodone. He reports that he does not drink alcohol. Mr. Ungaro is allergic to aspirin; epinephrine; lidocaine; novocain [procaine]; penicillins; strawberry extract; and shellfish allergy.   HPI  Today, he is being contacted for medication management.  The  patient indicates doing well with the current medication regimen. No adverse reactions or side effects reported to the medications.  I could up with patient today and he indicated pending to have lung surgery to remove his lung mass today.  I wished him good luck and will continue to follow-up with him.  As it is usually our policy, we will continue to manage his chronic pain, but the surgeons would be responsible for writing for some additional pain medicine to help him during the acute postop pain.  Pharmacotherapy Assessment  Analgesic: Oxycodone IR 10 mg, 1 tab PO q 6 hrs (40 mg/day of oxycodone) + Compounded Specialty Analgesic Cream (w/ Ketamine) MME/day:60 mg/day.   Monitoring: Pharmacotherapy: No side-effects or adverse reactions reported. [place] PMP: PDMP reviewed during this encounter.       Compliance: No problems identified. Effectiveness: Clinically acceptable. Plan: Refer to "POC".  UDS:  Summary  Date Value Ref Range Status  01/20/2018 FINAL  Final    Comment:    ==================================================================== TOXASSURE SELECT 13 (MW) ==================================================================== Test                             Result       Flag       Units Drug Present   Oxycodone                      1855                    ng/mg creat   Oxymorphone                    685                     ng/mg creat   Noroxycodone                   1293                    ng/mg creat   Noroxymorphone                 103                     ng/mg creat    Sources of oxycodone are scheduled prescription medications.    Oxymorphone, noroxycodone, and noroxymorphone are expected    metabolites of oxycodone. Oxymorphone is also available as a    scheduled prescription medication. ==================================================================== Test                      Result    Flag   Units      Ref Range   Creatinine              96               mg/dL       >=20 ==================================================================== Declared Medications:  Medication list was not provided. ==================================================================== For clinical consultation, please call [phone]. ====================================================================    Laboratory Chemistry Profile (12 mo)  Renal: 10/06/2018: BUN/Creatinine Ratio 12 01/20/2019: BUN 19; Creatinine, Ser 0.85  Lab Results  Component Value Date   GFRAA >60 01/20/2019   GFRNONAA >60 01/20/2019   Hepatic: 01/17/2019: Albumin 3.7 Lab Results  Component Value Date   AST 31 01/17/2019   ALT 43 01/17/2019   Other: No results found for requested labs within last 8760 hours. Note: Above Lab results reviewed.  Imaging  Last 90 days:  Dg Chest 1 View  Result Date: 01/16/2019 CLINICAL DATA:  Short of breath. Follow-up chest tube. EXAM: CHEST  1 VIEW COMPARISON:  01/15/2019 FINDINGS: Left-sided chest tube is in place. No significant pneumothorax visualized. Decreased  lung volumes. Pulmonary vascular congestion. Atelectasis noted in the left base. IMPRESSION: 1. No significant change in aeration to the left lung. 2. No significant pneumothorax noted. 3. Pulmonary vascular congestion. Electronically Signed   By: Kerby Moors M.D.   On: 01/16/2019 05:42   Dg Chest 2 View  Result Date: 01/19/2019 CLINICAL DATA:  Postoperative check, left-sided chest tube in place. EXAM: CHEST - 2 VIEW COMPARISON:  01/18/2019 FINDINGS: Two left-sided chest tubes remain in place. Tiny left pneumothorax is noted along the periphery of the left chest. Chest tube position similar to prior study. Lobar airspace disease/collapse on the right as before. Probable small left effusion. Dilated bowel loops in the upper abdomen as before. Cardiomediastinal contours are stable. IMPRESSION: 1. Tiny left pneumothorax along the periphery of the left chest. Two left-sided chest tubes remain in  place. 2. Redemonstration of volume loss and or consolidation in the right lung base and dilated bowel loops in the upper abdomen. Electronically Signed   By: Zetta Bills M.D.   On: 01/19/2019 10:02   Dg Chest 2 View  Result Date: 01/15/2019 CLINICAL DATA:  Postoperative EXAM: CHEST - 2 VIEW COMPARISON:  01/14/2019 FINDINGS: No significant change in examination status post left thoracotomy with left-sided chest tubes in position and no significant pneumothorax. Very low volume examination with mild diffuse interstitial opacity, likely bronchovascular crowding. No new airspace opacity. Mild cardiomegaly. Subcutaneous emphysema about the left chest wall. IMPRESSION: 1. No significant change in examination status post left thoracotomy with left-sided chest tubes in position and no significant pneumothorax. 2. Very low volume examination with mild diffuse interstitial opacity, likely bronchovascular crowding. No new airspace opacity. Electronically Signed   By: Eddie Candle M.D.   On: 01/15/2019 09:19   Dg Abd 1 View  Result Date: 01/17/2019 CLINICAL DATA:  Abdominal distention. EXAM: ABDOMEN - 1 VIEW COMPARISON:  01/16/2019 FINDINGS: Moderate distension of the stomach is again noted. There is persistent gaseous distension of the colon. Decreased stool burden noted on today's exam. IMPRESSION: Persistent gaseous distension of the large bowel loops with interval decrease in overall stool burden. Electronically Signed   By: Kerby Moors M.D.   On: 01/17/2019 06:05   Nm Pet Image Initial (pi) Skull Base To Thigh  Result Date: 11/04/2018 CLINICAL DATA:  Initial  treatment strategy for pulmonary nodule . EXAM: NUCLEAR MEDICINE PET SKULL BASE TO THIGH TECHNIQUE: 12.08 mCi F-18 FDG was injected intravenously. Full-ring PET imaging was performed from the skull base to thigh after the radiotracer. CT data was obtained and used for attenuation correction and anatomic localization. Fasting blood glucose: 114  mg/dl COMPARISON: CT chest 10/16/2018 FINDINGS: Mediastinal blood pool activity: SUV max 3.1 Liver activity: SUV max NA NECK: No hypermetabolic lymph nodes in the neck. Incidental CT findings: none CHEST: No hypermetabolic axillary, supraclavicular, mediastinal, or hilar lymph nodes. Superior segment left lower lobe lung nodule measures 1.5 cm and an SUV max of 2.06. No additional pulmonary nodules or masses identified. Incidental CT findings: none ABDOMEN/PELVIS: No abnormal FDG uptake identified within the liver, pancreas, spleen, or adrenal glands. No hypermetabolic abdominal or pelvic lymph nodes. Incidental CT findings: Calcified granulomas identified within the liver and spleen. Aortic atherosclerosis. SKELETON: No focal hypermetabolic activity to suggest skeletal metastasis. Incidental CT findings: Previous left hip arthroplasty. IMPRESSION: 1. There is mild FDG uptake associated with the superior segment left lower lobe lung nodule which has an SUV max of 2.06. Cannot rule out slow growing low-grade indolent pulmonary neoplasm. 2.  No FDG avid mediastinal or hilar lymph nodes or distant metastatic disease. Electronically Signed   By: Kerby Moors M.D.   On: 11/04/2018 15:11   Ct Biopsy  Result Date: 12/16/2018 INDICATION: 58 year old male with a history of left lower lobe nodule with SUV uptake possible carcinoma. The patient has an allergy to local anesthesia. EXAM: CT BIOPSY MEDICATIONS: None. ANESTHESIA/SEDATION: Moderate (conscious) sedation was employed during this procedure. A total of Versed 3.0 mg and Fentanyl 125 mcg was administered intravenously. Moderate Sedation Time: 29 minutes. The patient's level of consciousness and vital signs were monitored continuously by radiology nursing throughout the procedure under my direct supervision. FLUOROSCOPY TIME:  CT COMPLICATIONS: None immediate. PROCEDURE: The procedure, risks, benefits, and alternatives were explained to the patient and the  patient's family. Specific risks that were addressed included bleeding, infection, pneumothorax, need for further procedure including chest tube placement, chance of delayed pneumothorax or hemorrhage, hemoptysis, nondiagnostic sample, cardiopulmonary collapse, death. Questions regarding the procedure were encouraged and answered. The patient understands and consents to the procedure. Patient was positioned in the left decubitus position on the CT gantry table and a scout CT of the chest was performed for planning purposes. Given the patient's severe allergy to all local anesthetics, ice packs were placed before images were acquired. Once angle of approach was determined, the skin and subcutaneous tissues this scan was prepped and draped in the usual sterile fashion, and a sterile drape was applied covering the operative field. A sterile gown and sterile gloves were used for the procedure. Local anesthesia was deferred. Using CT guidance, a 17 gauge trocar needle was advanced into the left lower lobetarget. After confirmation of the tip, separate 18 gauge core biopsies were performed. These were placed into solution for transportation to the lab. Biosentry Device was deployed. A final CT image was performed. Patient tolerated the procedure well and remained hemodynamically stable throughout. No complications were encountered and no significant blood loss was encounter IMPRESSION: Status post CT-guided biopsy of left lower lobe nodule. Tissue specimen sent to pathology for complete histopathologic analysis. Signed, Dulcy Fanny. Dellia Nims, RPVI [facility] Radiology Electronically Signed   By: Corrie Mckusick D.O.   On: 12/16/2018 12:55   Dg Chest Port 1 View  Result Date: 01/18/2019 CLINICAL DATA:  Chest tube in place EXAM: PORTABLE CHEST 1 VIEW COMPARISON:  Two days ago FINDINGS: Low volume chest with interval lobar collapse at the right base. Stable heart size  accentuated by low volumes. No edema, effusion, or pneumothorax. Large bore chest tube at the left base. IMPRESSION: Interval lobar collapse at the right base.  Lung volumes are low. No visible pneumothorax. Electronically Signed   By: Monte Fantasia M.D.   On: 01/18/2019 06:53   Dg Chest Port 1 View  Result Date: 01/14/2019 CLINICAL DATA:  Postop thoracotomy, left. EXAM: PORTABLE CHEST 1 VIEW COMPARISON:  Preoperative radiograph 12/16/2018, CT 11/17/2018 FINDINGS: Two left-sided chest tubes in place, 1 directed towards the apex, 1 towards the base. No definite visualized pneumothorax. Subcutaneous emphysema about the left lateral chest wall with adjacent skin staples. Lung volumes are low. Upper normal heart size, accentuated by low lung volumes. Bronchovascular crowding without pulmonary edema. No focal abnormality in the right lung. IMPRESSION: 1. Two left-sided chest tubes in place. No definite pneumothorax. Subcutaneous emphysema about the left lateral chest wall. 2. Low lung volumes with bronchovascular crowding. Electronically Signed   By: Keith Rake M.D.   On: 01/14/2019 17:59  Dg Chest Port 1 View  Result Date: 12/17/2018 CLINICAL DATA:  Pneumothorax post biopsy. EXAM: PORTABLE CHEST 1 VIEW COMPARISON:  CT 12/16/2018, 11/17/2018. Chest x-ray 11/18/2018. PET-CT 11/04/2018. FINDINGS: Mediastinum and hilar structures normal. Heart size normal. Peripheral left base atelectasis/infiltrate. Pulmonary infarct cannot be excluded. Previously identified left lung pulmonary nodule best identified by prior CT. No prominent pleural effusion. No pneumothorax identified. No acute bony abnormality identified. IMPRESSION: 1.  No pneumothorax identified. 2. Peripheral left base atelectasis/infiltrate. Given the peripheral nature of this, pulmonary infarct cannot be excluded. 3. Previously identified left lung pulmonary nodule best identified by prior CT. Electronically Signed   By: Marcello Moores  Register   On:  12/17/2018 06:31   Dg Chest Port 1 View  Result Date: 11/18/2018 CLINICAL DATA:  Lung nodule status post bronchoscopy EXAM: PORTABLE CHEST 1 VIEW COMPARISON:  CT 11/17/2018 FINDINGS: The heart size and mediastinal contours are stable. Lung volumes are low with crowding of the central bronchovascular markings. No definite focal new airspace consolidation. No pleural effusion or pneumothorax. IMPRESSION: No pneumothorax. Electronically Signed   By: Davina Poke M.D.   On: 11/18/2018 13:26   Dg Abd Portable 2v  Result Date: 01/16/2019 CLINICAL DATA:  Short of breath. Chest tube. EXAM: PORTABLE ABDOMEN - 2 VIEW COMPARISON:  10/30/2017 FINDINGS: Moderate distension of the stomach. There is diffuse gaseous distension of the colon with a moderate stool burden identified within the right colon and descending colon. No significant small bowel dilatation identified. Previous left hip arthroplasty and anterior disc fusion of the lower lumbar spine. IMPRESSION: 1. Moderate gaseous distension of the, nonspecific. This may reflect colonic ileus versus distal bowel obstruction. Clinical correlation advised. 2. Moderate stool burden within the right colon and descending colon. Electronically Signed   By: Kerby Moors M.D.   On: 01/16/2019 05:44   Dg C-arm 1-60 Min-no Report  Result Date: 11/18/2018 Fluoroscopy was utilized by the requesting physician.  No radiographic interpretation.   Ct Super D Chest Wo Contrast  Result Date: 11/17/2018 CLINICAL DATA:  lung mass-surgical planning. EXAM: CT CHEST WITHOUT CONTRAST TECHNIQUE: Multidetector CT imaging of the chest was performed using thin slice collimation for electromagnetic bronchoscopy planning purposes, without intravenous contrast. COMPARISON:  10/16/2018 FINDINGS: Cardiovascular: The heart size is normal. No pericardial effusion. Aortic atherosclerosis. Mediastinum/Nodes: No enlarged mediastinal, hilar, or axillary lymph nodes. Thyroid gland, trachea, and  esophagus demonstrate no significant findings. Lungs/Pleura: No pleural effusion identified. Mild emphysema. No airspace consolidation, atelectasis or pneumothorax. Pulmonary nodule in the superior segment left lower lobe measures 1.5 cm, image 24/3. This is compared with 1.4 cm on 10/16/2018. Small subpleural nodule in the posterior right upper lobe measures 3.4 mm, image 15/3. Unchanged. 3.8 mm nodule within the posterolateral right lower lobe is noted, image 15/3. Also unchanged. Upper Abdomen: No acute abnormality. Musculoskeletal: Spondylosis within the thoracic spine. No aggressive lytic or sclerotic bone lesions. IMPRESSION: 1. Similar appearance of superior segment left lower lobe lung nodule which exhibits low level FDG uptake on recent PET-CT. Additional tiny nodules in the right upper lobe are unchanged. 2. Aortic Atherosclerosis (ICD10-I70.0) and Emphysema (ICD10-J43.9). Electronically Signed   By: Kerby Moors M.D.   On: 11/17/2018 10:14    Assessment  The primary encounter diagnosis was Chronic pain syndrome. Diagnoses of Chronic low back pain (Primary Area of Pain) (Bilateral) (L>R), Chronic lower extremity pain (Secondary area of Pain) (Left), Chronic cervical radicular pain (Third area of Pain) (Bilateral) (C5/C6 dermatome) (L>R), and Chronic pain of both upper  extremities were also pertinent to this visit.  Plan of Care  I have discontinued Ali A. Petro's nicotine and nicotine polacrilex. I am also having him start on Oxycodone HCl and Oxycodone HCl. Additionally, I am having him maintain his esomeprazole, SUMAtriptan, albuterol, Accu-Chek Guide, nortriptyline, hydrALAZINE, montelukast, atorvastatin, metoprolol succinate, Arnuity Ellipta, metoprolol succinate, metFORMIN, Potassium Chloride ER, NONFORMULARY OR COMPOUNDED ITEM, and Oxycodone HCl.  Pharmacotherapy (Medications Ordered): Meds ordered this encounter  Medications  . NONFORMULARY OR COMPOUNDED ITEM    Sig: 10%  Ketamine/2% Cyclobenzaprine/6% Gabapentin Cream Sig: 1-2 ml to affected area 3-4 times/day. Amount: 240 GM    Dispense:  240 each    Refill:  5  . Oxycodone HCl 10 MG TABS    Sig: Take 1 tablet (10 mg total) by mouth every 6 (six) hours as needed. Must last 30 days    Dispense:  120 tablet    Refill:  0    Chronic Pain: STOP Act (Not applicable) Fill 1 day early if closed on refill date. Do not fill until: 01/26/2019. To last until: 02/25/2019. Avoid benzodiazepines within 8 hours of opioids  . Oxycodone HCl 10 MG TABS    Sig: Take 1 tablet (10 mg total) by mouth every 6 (six) hours as needed. Must last 30 days    Dispense:  120 tablet    Refill:  0    Chronic Pain: STOP Act (Not applicable) Fill 1 day early if closed on refill date. Do not fill until: 02/25/2019. To last until: 03/27/2019. Avoid benzodiazepines within 8 hours of opioids  . Oxycodone HCl 10 MG TABS    Sig: Take 1 tablet (10 mg total) by mouth every 6 (six) hours as needed. Must last 30 days    Dispense:  120 tablet    Refill:  0    Chronic Pain: STOP Act (Not applicable) Fill 1 day early if closed on refill date. Do not fill until: 03/27/2019. To last until: 04/26/2019. Avoid benzodiazepines within 8 hours of opioids   Orders:  No orders of the defined types were placed in this encounter.  Follow-up plan:   Return in about 3 months (around 04/26/2019) for (VV), (MM).      Interventional therapies: Planned, scheduled, and/or pending:   None at this time. Claims to have anaphylactic allergy to Lidocaine.   Considering:   None at this time. Claims to have anaphylactic allergy to Lidocaine.   Palliative PRN treatment(s):   None at this time. Claims to have anaphylactic allergy to Lidocaine.     Recent Visits No visits were found meeting these conditions.  Showing recent visits within past 90 days and meeting all other requirements   Today's Visits Date Type Provider Dept  01/20/19 Telemedicine [name],  MD [facility]  Showing today's visits and meeting all other requirements   Future Appointments No visits were found meeting these conditions.  Showing future appointments within next 90 days and meeting all other requirements   I discussed the assessment and treatment plan with the patient. The patient was provided an opportunity to ask questions and all were answered. The patient agreed with the plan and demonstrated an understanding of the instructions.  Patient advised to call back or seek an in-person evaluation if the symptoms or condition worsens.  Total duration of non-face-to-face encounter: 15 minutes.  Note by: Gaspar Cola, MD Date: 01/20/2019; Time: 12:21 PM  Note: This dictation was prepared with Dragon dictation. Any transcriptional errors that may result  from this process are unintentional.  Disclaimer:  * Given the special circumstances of the COVID-19 pandemic, the federal government has announced that the Office for Civil Rights (OCR) will exercise its enforcement discretion and will not impose penalties on physicians using telehealth in the event of noncompliance with regulatory requirements under the [place] (HIPAA) in connection with the good faith provision of telehealth during the BMWUX-32 national public health emergency. ([place])

## 2019-01-19 NOTE — Progress Notes (Signed)
CRITICAL CARE PROGRESS NOTE    Name: Kristopher [name] MRN: [id number] DOB: 1960/11/30     LOS: 5   SUBJECTIVE FINDINGS & SIGNIFICANT EVENTS   Patient is clinically improved , he is coughing up high volume thick phlegm. I will start mucomyst nebulzer and Metaneb with saline to recruit atelectatic lung.    Plan for bronchoscopy discussed with patient.  Will try to arrange for this week.  Discussed with RT to help coordinate anesthesia and with scheduling for OR.   PAST MEDICAL HISTORY   Past Medical History:  Diagnosis Date  . Allergy   . Arthritis    left foot  . Benign hypertensive kidney disease   . Chronic back pain    Four rods in back  . Diabetes mellitus, type 2 ([place])   . Dyspnea   . GERD (gastroesophageal reflux disease)   . Hypertension   . Migraines    daily     SURGICAL HISTORY   Past Surgical History:  Procedure Laterality Date  . APPENDECTOMY    . BACK SURGERY    . COLONOSCOPY WITH PROPOFOL N/A 02/19/2016   Procedure: COLONOSCOPY WITH PROPOFOL;  Surgeon: Lucilla Lame, MD;  Location: [place];  Service: Endoscopy;  Laterality: N/A;  . COLONOSCOPY WITH PROPOFOL N/A 01/19/2018   Procedure: COLONOSCOPY WITH PROPOFOL;  Surgeon: Lucilla Lame, MD;  Location: [place];  Service: Endoscopy;  Laterality: N/A;  Diabetic - oral meds  . DG OPERATIVE LEFT HIP ([place] HX)     10/19  . ELECTROMAGNETIC NAVIGATION BROCHOSCOPY Left 11/18/2018   Procedure: ELECTROMAGNETIC NAVIGATION BRONCHOSCOPY;  Surgeon: Tyler Pita, MD;  Location: [facility];  Service: Cardiopulmonary;  Laterality: Left;  . FOOT SURGERY Left    Screws and plates  . JOINT REPLACEMENT Left 12/2017   DR Rudene Christians Hip  . KNEE SURGERY Left    X 2  . LEG SURGERY    . POLYPECTOMY N/A 02/19/2016   Procedure:  POLYPECTOMY;  Surgeon: Lucilla Lame, MD;  Location: [place];  Service: Endoscopy;  Laterality: N/A;  . POLYPECTOMY  01/19/2018   Procedure: POLYPECTOMY;  Surgeon: Lucilla Lame, MD;  Location: [place];  Service: Endoscopy;;  . THORACOTOMY Left 01/14/2019   Procedure: THORACOTOMY MAJOR, LEFT;  Surgeon: Nestor Lewandowsky, MD;  Location: [facility];  Service: General;  Laterality: Left;  . TOTAL HIP ARTHROPLASTY Left 12/02/2017   Procedure: TOTAL HIP ARTHROPLASTY ANTERIOR APPROACH;  Surgeon: Hessie Knows, MD;  Location: [facility];  Service: Orthopedics;  Laterality: Left;  [name] VIDEO BRONCHOSCOPY Left 01/14/2019   Procedure: VIDEO BRONCHOSCOPY WITH FLUORO, LEFT;  Surgeon: Nestor Lewandowsky, MD;  Location: [facility];  Service: General;  Laterality: Left;     FAMILY HISTORY   Family History  Problem Relation Age of Onset  . Cancer Father   . Diabetes Sister   . Thrombosis Sister      SOCIAL HISTORY   Social History   Tobacco Use  . Smoking status: Former Smoker    Packs/day: 0.00    Years: 35.00    Pack years: 0.00  . Smokeless tobacco: Never Used  Substance Use Topics  . Alcohol use: No    Alcohol/week: 0.0 standard drinks  . Drug use: Yes    Types: Oxycodone    Comment: prescribed     MEDICATIONS   Current Medication:  Current Facility-Administered Medications:  .  acetylcysteine (MUCOMYST) 20 % nebulizer / oral solution 4 mL, 4 mL, Nebulization, Q4H, Ottie Glazier, MD .  atorvastatin (LIPITOR) tablet 10 mg, 10 mg, Oral, Daily, Nestor Lewandowsky, MD, 10 mg at 01/19/19 0901 .  bisacodyl (DULCOLAX) EC tablet 10 mg, 10 mg, Oral, Daily, Nestor Lewandowsky, MD, 10 mg at 01/19/19 0902 .  Chlorhexidine Gluconate Cloth 2 % PADS 6 each, 6 each, Topical, Daily, Nestor Lewandowsky, MD, 6 each at 01/19/19 1155 .  enoxaparin (LOVENOX) injection 40 mg, 40 mg, Subcutaneous, Q24H, Oaks, Christia Reading, MD, 40 mg at 01/19/19 0900 .  guaiFENesin (MUCINEX) 12 hr tablet 600 mg, 600 mg, Oral, BID,  Nestor Lewandowsky, MD .  hydrALAZINE (APRESOLINE) injection 10 mg, 10 mg, Intravenous, Q6H PRN, Tukov-Yual, Magdalene S, NP .  hydrALAZINE (APRESOLINE) tablet 100 mg, 100 mg, Oral, BID, Nestor Lewandowsky, MD, 100 mg at 01/19/19 0901 .  insulin aspart (novoLOG) injection 0-5 Units, 0-5 Units, Subcutaneous, QHS, [place] Ree, Zachary R, PA-C .  insulin aspart (novoLOG) injection 0-9 Units, 0-9 Units, Subcutaneous, TID WC, Tylene Fantasia, PA-C, 1 Units at 01/19/19 0900 .  ipratropium-albuterol (DUONEB) 0.5-2.5 (3) MG/3ML nebulizer solution 3 mL, 3 mL, Nebulization, Q6H, Oaks, Timothy, MD, 3 mL at 01/19/19 1446 .  MEDLINE mouth rinse, 15 mL, Mouth Rinse, BID, Tukov-Yual, Magdalene S, NP, 15 mL at 01/18/19 2139 .  metFORMIN (GLUCOPHAGE-XR) 24 hr tablet 1,000 mg, 1,000 mg, Oral, BID WC, Nestor Lewandowsky, MD, 1,000 mg at 01/19/19 0902 .  methocarbamol (ROBAXIN) tablet 750 mg, 750 mg, Oral, QID, Nestor Lewandowsky, MD, 750 mg at 01/19/19 1245 .  metoprolol succinate (TOPROL-XL) 24 hr tablet 125 mg, 125 mg, Oral, BID, Dhungel, Nishant, MD, 25 mg at 01/19/19 0901 .  montelukast (SINGULAIR) tablet 10 mg, 10 mg, Oral, QHS, Oaks, Christia Reading, MD, 10 mg at 01/18/19 2125 .  morphine 2 MG/ML injection 1-2 mg, 1-2 mg, Intravenous, Q1H PRN, Nestor Lewandowsky, MD, 2 mg at 01/16/19 2150 .  nicotine (NICODERM CQ - dosed in mg/24 hours) patch 21 mg, 21 mg, Transdermal, Daily, Oaks, [place], MD .  nicotine polacrilex (NICORETTE) gum 2 mg, 2 mg, Oral, PRN, Nestor Lewandowsky, MD .  nortriptyline (PAMELOR) capsule 50 mg, 50 mg, Oral, QHS, Oaks, Christia Reading, MD, 50 mg at 01/17/19 2101 .  ondansetron (ZOFRAN) injection 4 mg, 4 mg, Intravenous, Q6H PRN, Nestor Lewandowsky, MD, 4 mg at 01/16/19 1335 .  oxyCODONE (Oxy IR/ROXICODONE) immediate release tablet 10 mg, 10 mg, Oral, QID, Awilda Bill, NP, 10 mg at 01/19/19 1543 .  pantoprazole (PROTONIX) EC tablet 40 mg, 40 mg, Oral, Daily, Nestor Lewandowsky, MD, 40 mg at 01/19/19 0901 .  polyethylene glycol (MIRALAX /  GLYCOLAX) packet 17 g, 17 g, Oral, Daily, Tukov-Yual, Magdalene S, NP, 17 g at 01/18/19 0947 .  senna-docusate (Senokot-S) tablet 2 tablet, 2 tablet, Oral, BID, Flora Lipps, MD, 2 tablet at 01/19/19 1542    ALLERGIES   Aspirin, Epinephrine, Lidocaine, Novocain [procaine], Penicillins, Strawberry extract, and Shellfish allergy    REVIEW OF SYSTEMS     10 point ROS conducted and is as per subjective findings, specifically no chest pain no abdominal pain  PHYSICAL EXAMINATION   Vital Signs: Temp:  [97.4 F (36.3 C)-98.4 F (36.9 C)] 98.4 F (36.9 C) (11/17 0443) Pulse Rate:  [75-89] 75 (11/17 0443) Resp:  [13-20] 20 (11/17 0443) BP: (124-133)/(85-96) 127/85 (11/17 0443) SpO2:  [95 %-98 %] 95 % (11/17 1446)  GENERAL:NAD HEAD: Normocephalic, atraumatic.  EYES: Pupils equal, round, reactive to light.  No scleral icterus.  MOUTH: Moist mucosal membrane. NECK: Supple. No thyromegaly. No nodules. No JVD.  PULMONARY: CTA on  right, left chest with chest tube CARDIOVASCULAR: S1 and S2. Regular rate and rhythm. No murmurs, rubs, or gallops.  GASTROINTESTINAL: Soft, nontender, non-distended. No masses. Positive bowel sounds. No hepatosplenomegaly.  MUSCULOSKELETAL: No swelling, clubbing, or edema.  NEUROLOGIC: Mild distress due to acute illness SKIN:intact,warm,dry   PERTINENT DATA     Infusions:  Scheduled Medications: . acetylcysteine  4 mL Nebulization Q4H  . atorvastatin  10 mg Oral Daily  . bisacodyl  10 mg Oral Daily  . Chlorhexidine Gluconate Cloth  6 each Topical Daily  . enoxaparin (LOVENOX) injection  40 mg Subcutaneous Q24H  . guaiFENesin  600 mg Oral BID  . hydrALAZINE  100 mg Oral BID  . insulin aspart  0-5 Units Subcutaneous QHS  . insulin aspart  0-9 Units Subcutaneous TID WC  . ipratropium-albuterol  3 mL Nebulization Q6H  . mouth rinse  15 mL Mouth Rinse BID  . metFORMIN  1,000 mg Oral BID WC  . methocarbamol  750 mg Oral QID  . metoprolol succinate   125 mg Oral BID  . montelukast  10 mg Oral QHS  . nicotine  21 mg Transdermal Daily  . nortriptyline  50 mg Oral QHS  . oxyCODONE  10 mg Oral QID  . pantoprazole  40 mg Oral Daily  . polyethylene glycol  17 g Oral Daily  . senna-docusate  2 tablet Oral BID   PRN Medications: hydrALAZINE, morphine injection, nicotine polacrilex, ondansetron (ZOFRAN) IV Hemodynamic parameters:   Intake/Output: 11/16 0701 - 11/17 0700 In: -  Out: 595 [Urine:575; Chest Tube:20]  Ventilator  Settings:       LAB RESULTS:  Basic Metabolic Panel: Recent Labs  Lab 01/14/19 2021 01/15/19 0306 01/16/19 0424 01/16/19 0556 01/17/19 0701 01/19/19 0431  NA 137 137 138  --  135 134*  K 4.4 4.3 4.2  --  3.7 3.4*  CL 102 102 100  --  99 100  CO2 25 26 29   --  26 27  GLUCOSE 217* 193* 156*  --  176* 132*  BUN 14 14 15   --  25* 23*  CREATININE 0.97 0.97 0.94  --  0.87 0.97  CALCIUM 8.4* 8.3* 8.9  --  8.6* 8.3*  MG  --  1.9  --  2.1 2.5*  --   PHOS  --   --   --  3.2 3.1  --    Liver Function Tests: Recent Labs  Lab 01/16/19 0556 01/17/19 0701  AST 54* 31  ALT 59* 43  ALKPHOS 103 92  BILITOT 1.2 1.1  PROT 7.9 7.2  ALBUMIN 4.3 3.7   No results for input(s): LIPASE, AMYLASE in the last 168 hours. No results for input(s): AMMONIA in the last 168 hours. CBC: Recent Labs  Lab 01/14/19 1847 01/15/19 0306 01/16/19 0424 01/17/19 0701 01/18/19 0439  WBC 17.4* 13.7* 17.6* 15.1* 13.0*  NEUTROABS  --  11.6* 12.3*  --   --   HGB 13.7 13.2 14.7 13.6 13.5  HCT 41.4 39.7 46.4 43.2 40.9  MCV 88.3 88.8 91.0 92.5 89.1  PLT 218 203 252 251 257   Cardiac Enzymes: No results for input(s): CKTOTAL, CKMB, CKMBINDEX, TROPONINI in the last 168 hours. BNP: Invalid input(s): POCBNP CBG: Recent Labs  Lab 01/18/19 1552 01/18/19 2123 01/19/19 0724 01/19/19 1140 01/19/19 1658  GLUCAP 169* 120* 140* 92 117*     IMAGING RESULTS:  Imaging: Dg Chest 2 View  Result Date: 01/19/2019 CLINICAL  DATA:  Postoperative check, left-sided  chest tube in place. EXAM: CHEST - 2 VIEW COMPARISON:  01/18/2019 FINDINGS: Two left-sided chest tubes remain in place. Tiny left pneumothorax is noted along the periphery of the left chest. Chest tube position similar to prior study. Lobar airspace disease/collapse on the right as before. Probable small left effusion. Dilated bowel loops in the upper abdomen as before. Cardiomediastinal contours are stable. IMPRESSION: 1. Tiny left pneumothorax along the periphery of the left chest. Two left-sided chest tubes remain in place. 2. Redemonstration of volume loss and or consolidation in the right lung base and dilated bowel loops in the upper abdomen. Electronically Signed   By: Zetta Bills M.D.   On: 01/19/2019 10:02   Dg Chest Port 1 View  Result Date: 01/18/2019 CLINICAL DATA:  Chest tube in place EXAM: PORTABLE CHEST 1 VIEW COMPARISON:  Two days ago FINDINGS: Low volume chest with interval lobar collapse at the right base. Stable heart size accentuated by low volumes. No edema, effusion, or pneumothorax. Large bore chest tube at the left base. IMPRESSION: Interval lobar collapse at the right base.  Lung volumes are low. No visible pneumothorax. Electronically Signed   By: Monte Fantasia M.D.   On: 01/18/2019 06:53      EXAM: ABDOMEN - 1 VIEW  COMPARISON:  01/16/2019  FINDINGS: Moderate distension of the stomach is again noted. There is persistent gaseous distension of the colon. Decreased stool burden noted on today's exam.  IMPRESSION: Persistent gaseous distension of the large bowel loops with interval decrease in overall stool burden.   Electronically Signed   By: Kerby Moors M.D.   On: 01/17/2019 06:05     CLINICAL DATA:  Short of breath. Follow-up chest tube.  EXAM: CHEST  1 VIEW  COMPARISON:  01/15/2019  FINDINGS: Left-sided chest tube is in place. No significant pneumothorax visualized. Decreased lung volumes.  Pulmonary vascular congestion. Atelectasis noted in the left base.  IMPRESSION: 1. No significant change in aeration to the left lung. 2. No significant pneumothorax noted. 3. Pulmonary vascular congestion.   Electronically Signed   By: Kerby Moors M.D.   On: 01/16/2019 05:42   CLINICAL DATA:  Postoperative check, left-sided chest tube in place.  EXAM: CHEST - 2 VIEW  COMPARISON:  01/18/2019  FINDINGS: Two left-sided chest tubes remain in place. Tiny left pneumothorax is noted along the periphery of the left chest. Chest tube position similar to prior study.  Lobar airspace disease/collapse on the right as before. Probable small left effusion.  Dilated bowel loops in the upper abdomen as before. Cardiomediastinal contours are stable.  IMPRESSION: 1. Tiny left pneumothorax along the periphery of the left chest. Two left-sided chest tubes remain in place. 2. Redemonstration of volume loss and or consolidation in the right lung base and dilated bowel loops in the upper abdomen.   Electronically Signed   By: Zetta Bills M.D.   On: 01/19/2019 10:02  ASSESSMENT AND PLAN      Lung mass status post CT-guided biopsy favoring squamous cell lung CA   -Post thoracotomy with left chest tube   -Surgery on case appreciate input   -Chest tube evaluation-good tidal wave, no air leak on forced expiratory maneuver, minimal output approximately  -discussed plan for possible bronchoscopy as patient developed atelectasis wit RLL collapse.  -reviewed CXR today    Ottie Glazier, M.D.  Division of [place]

## 2019-01-19 NOTE — Care Management Important Message (Signed)
Important Message  Patient Details  Name: Kristopher [name] MRN: [id number] Date of Birth: [birth year]   Medicare Important Message Given:  Yes     Dannette Barbara 01/19/2019, 10:45 AM

## 2019-01-19 NOTE — Progress Notes (Signed)
[facility]  SUBJECTIVE: Kristopher [name] is a 58 year old male s/p lobectomy for lung mass.  Went into atrial fibrillation following procedure and has since converted to sinus rhythm.  No recurrent breakthroughs. Remains on metoprolol 125mg  twice daily.    Vitals:   01/18/19 1858 01/18/19 1948 01/19/19 0242 01/19/19 0443  BP: (!) 133/93 (!) 126/96  127/85  Pulse: 89 89  75  Resp: 18 20  20   Temp: (!) 97.4 F (36.3 C) 98.4 F (36.9 C)  98.4 F (36.9 C)  TempSrc: Oral Oral  Oral  SpO2: 97% 96% 96% 98%  Weight:      Height:         Intake/Output Summary (Last 24 hours) at 01/19/2019 0804 Last data filed at 01/18/2019 2230 Gross per 24 hour  Intake -  Output 595 ml  Net -595 ml      PHYSICAL EXAM  General: Well developed, well nourished, in no acute distress HEENT:  Normocephalic and atramatic Neck:  No JVD.  Lungs: Clear bilaterally to auscultation and percussion. Heart: HRRR . Normal S1 and S2 without gallops or murmurs.  Abdomen: Bowel sounds are positive, abdomen soft and non-tender  Msk:  Back normal, normal gait. Normal strength and tone for age. Extremities: No clubbing, cyanosis or edema.   Neuro: Alert and oriented X 3. Psych:  Good affect, responds appropriately   LABS: Basic Metabolic Panel: Recent Labs    01/17/19 0701 01/19/19 0431  NA 135 134*  K 3.7 3.4*  CL 99 100  CO2 26 27  GLUCOSE 176* 132*  BUN 25* 23*  CREATININE 0.87 0.97  CALCIUM 8.6* 8.3*  MG 2.5*  --   PHOS 3.1  --    Liver Function Tests: Recent Labs    01/17/19 0701  AST 31  ALT 43  ALKPHOS 92  BILITOT 1.1  PROT 7.2  ALBUMIN 3.7   No results for input(s): LIPASE, AMYLASE in the last 72 hours. CBC: Recent Labs    01/17/19 0701 01/18/19 0439  WBC 15.1* 13.0*  HGB 13.6 13.5  HCT 43.2 40.9  MCV 92.5 89.1  PLT 251 257   Cardiac Enzymes: No results for input(s): CKTOTAL, CKMB, CKMBINDEX, TROPONINI in the last 72 hours. BNP: Invalid input(s): POCBNP D-Dimer: No  results for input(s): DDIMER in the last 72 hours. Hemoglobin A1C: No results for input(s): HGBA1C in the last 72 hours. Fasting Lipid Panel: No results for input(s): CHOL, HDL, LDLCALC, TRIG, CHOLHDL, LDLDIRECT in the last 72 hours. Thyroid Function Tests: No results for input(s): TSH, T4TOTAL, T3FREE, THYROIDAB in the last 72 hours.  Invalid input(s): FREET3 Anemia Panel: No results for input(s): VITAMINB12, FOLATE, FERRITIN, TIBC, IRON, RETICCTPCT in the last 72 hours.  Dg Chest Port 1 View  Result Date: 01/18/2019 CLINICAL DATA:  Chest tube in place EXAM: PORTABLE CHEST 1 VIEW COMPARISON:  Two days ago FINDINGS: Low volume chest with interval lobar collapse at the right base. Stable heart size accentuated by low volumes. No edema, effusion, or pneumothorax. Large bore chest tube at the left base. IMPRESSION: Interval lobar collapse at the right base.  Lung volumes are low. No visible pneumothorax. Electronically Signed   By: Monte Fantasia M.D.   On: 01/18/2019 06:53    Echo:  Normal LV systolic function with an EF estimated between 55-60% with moderate LVH, grade 1 diastolic dysfunction.  Mild TR.   TELEMETRY: Normal sinus rhythm    ASSESSMENT AND PLAN:  Active Problems:   Lung  mass   Atrial fibrillation with RVR (HCC)   Shortness of breath   Acute respiratory failure with hypoxia (HCC)    1.  Atrial fibrillation with RVR              -S/p lobectomy, currently in sinus rhythm with no significant echocardiogram abnormalities              -Continue metoprolol 125mg  twice daily  -No further cardiac workup indicated at this time    Kristopher Arenas  PA-C 01/19/2019 8:04 AM

## 2019-01-19 NOTE — Progress Notes (Signed)
Physical Therapy Treatment Patient Details Name: Kristopher [name] MRN: [id number] DOB: 06-25-1960 Today's Date: 01/19/2019    History of Present Illness Pt is 58 yo male that presented to [facility] on 11/12 to undergo a bronchoscopy, left thoracotomy with left lower lobectomy, and insertion of 2 left sided chest tubes due to non-small cell carcinoma of left lung favoring squamous cell.  He was subsequently admitted to ICU, transferred to floor. Returned to CCU 11/14 for afib with RVR. PMH Migraines, Former Tobacco Abuse, HTN, GERD, Dyspnea, Type II Diabetes Mellitus, Chronic Back Pain, Benign Hypertensive Kidney Disease, Arthritis, and Allergies.    PT Comments    Patient was in bed, alert, agreeable to PT. Pt mobilized to EOB with minA, able to sit EOB for several minutes, good sitting balance, spO2 and HR closely monitored. Pt spO2 >90% on 3L throughout mobility. The patient greatly increased his ambulation distance this session with close chair follow, ambulated ~154ft with CGA and IV pole. 1-2 instances of standing rest breaks to address SOB, pt performed PLB with minimal verbal cues. Pt up in chair all needs in reach at end of session. Pt and RN informed patient should be ambulating 2-3x a day with staff outside PT session as well, RN and pt agreeable. The patient would benefit from further skilled PT intervention to continue to progress towards goals and return to PLOF.     Follow Up Recommendations  Home health PT;Supervision - Intermittent     Equipment Recommendations  None recommended by PT    Recommendations for Other Services       Precautions / Restrictions Precautions Precautions: Fall Precaution Comments: chest tubes, watch HR Restrictions Weight Bearing Restrictions: No    Mobility  Bed Mobility Overal bed mobility: Needs Assistance Bed Mobility: Supine to Sit     Supine to sit: HOB elevated;Min assist        Transfers Overall transfer level: Needs  assistance Equipment used: None Transfers: Sit to/from Stand Sit to Stand: Supervision         General transfer comment: uses IV pole for initial standing balance  Ambulation/Gait Ambulation/Gait assistance: Min guard Gait Distance (Feet): 120 Feet Assistive device: IV Pole Gait Pattern/deviations: Wide base of support;Step-through pattern;Shuffle Gait velocity: decreased   General Gait Details: 2-3 standing rest breaks with PLB   Stairs             Wheelchair Mobility    Modified Rankin (Stroke Patients Only)       Balance Overall balance assessment: Needs assistance Sitting-balance support: Feet supported Sitting balance-Leahy Scale: Good       Standing balance-Leahy Scale: Good                              Cognition Arousal/Alertness: Awake/alert Behavior During Therapy: WFL for tasks assessed/performed Overall Cognitive Status: Within Functional Limits for tasks assessed                                        Exercises Other Exercises Other Exercises: Pt instructed in activity pacing, PLB Other Exercises: pt instructed in seated marching, LAQ, seated heel/toe raises    General Comments        Pertinent Vitals/Pain Pain Assessment: 0-10 Pain Score: 8  Pain Descriptors / Indicators: Burning;Grimacing;Moaning;Guarding Pain Intervention(s): Limited activity within patient's tolerance;Monitored during session;Repositioned    Home Living  Prior Function            PT Goals (current goals can now be found in the care plan section) Progress towards PT goals: Progressing toward goals    Frequency    Min 2X/week      PT Plan Current plan remains appropriate    Co-evaluation              AM-PAC PT "6 Clicks" Mobility   Outcome Measure  Help needed turning from your back to your side while in a flat bed without using bedrails?: A Little Help needed moving from lying on  your back to sitting on the side of a flat bed without using bedrails?: A Little Help needed moving to and from a bed to a chair (including a wheelchair)?: A Little Help needed standing up from a chair using your arms (e.g., wheelchair or bedside chair)?: A Little Help needed to walk in hospital room?: A Little Help needed climbing 3-5 steps with a railing? : A Little 6 Click Score: 18    End of Session Equipment Utilized During Treatment: Gait belt Activity Tolerance: Patient tolerated treatment well Patient left: in chair;with call bell/phone within reach;with SCD's reapplied Nurse Communication: Mobility status PT Visit Diagnosis: Other abnormalities of gait and mobility (R26.89);Pain;Difficulty in walking, not elsewhere classified (R26.2) Pain - Right/Left: Left Pain - part of body: (chest/flank)     Time: 3736-6815 PT Time Calculation (min) (ACUTE ONLY): 23 min  Charges:  $Therapeutic Exercise: 23-37 mins                     Lieutenant Diego PT, DPT 10:48 AM,01/19/19 [phone]

## 2019-01-19 NOTE — Progress Notes (Signed)
Inpatient Diabetes Program Recommendations  AACE/ADA: New Consensus Statement on Inpatient Glycemic Control   Target Ranges:  Prepandial:   less than 140 mg/dL      Peak postprandial:   less than 180 mg/dL (1-2 hours)      Critically ill patients:  140 - 180 mg/dL  Results for Kristopher [name], Kristopher [name] (MRN [id number]) as of 01/19/2019 09:48  Ref. Range 01/18/2019 07:43 01/18/2019 11:45 01/18/2019 15:52 01/18/2019 21:23 01/19/2019 07:24  Glucose-Capillary Latest Ref Range: 70 - 99 mg/dL 136 (H) 111 (H) 169 (H) 120 (H) 140 (H)   Results for Kristopher [name], Kristopher [name] (MRN [id number]) as of 01/19/2019 09:48  Ref. Range 08/14/2017 04:33 01/15/2019 03:06  Hemoglobin A1C Latest Ref Range: 4.8 - 5.6 % 6.5 (H) 7.4 (H)   Review of Glycemic Control  Diabetes history: DM2 Outpatient Diabetes medications: Metformin XR 500 mg BID Current orders for Inpatient glycemic control: Novolog 0-9 units TID with meals, Novolog 0-5 units QHS, Metformin XR 1000 mg BID  Inpatient Diabetes Program Recommendations:   HbgA1C: A1C 7.4% on 01/15/2019 indicating an average glucose of 166 mg/dl over the past 2-3 months.  NOTE: Noted consult for Diabetes Coordinator. Chart reviewed. Noted patient was dx with DM2 on 12/30/2017 with A1C of 10% and was started on Metformin XR 500 mg BID and lifestyle modifications at that time (per office note by PCP on 12/30/2017). Patient also received outpatient DM education on 01/20/2018. Current A1C 7.4% on 01/15/19 and inpatient glucose trending within parameters. Will sign off consult at this time. Please re-consult if needed.  Thanks, Barnie Alderman, RN, MSN, CDE Diabetes Coordinator Inpatient Diabetes Program [phone] (Team Pager from 8am to 5pm)

## 2019-01-19 NOTE — Progress Notes (Signed)
Patient complaining of abdominal pain and distention. Miralax and senna given to help relieve constipation. MD notified.

## 2019-01-19 NOTE — Progress Notes (Signed)
Pharmacy Electrolyte Monitoring Consult:  Pharmacy consulted to assist in monitoring and replacing electrolytes in this 58 y.o. male admitted on 01/14/2019 for video bronchoscopy and left thoracotomy due to a postsurgical bout of atrial fibrillation, currently resolved. Patient with CHADSVASc of 2 (Diabetes and Hypertension).   Labs:  Sodium (mmol/L)  Date Value  01/19/2019 134 (L)  10/06/2018 142   Potassium (mmol/L)  Date Value  01/19/2019 3.4 (L)   Magnesium (mg/dL)  Date Value  01/17/2019 2.5 (H)   Phosphorus (mg/dL)  Date Value  01/17/2019 3.1   Calcium (mg/dL)  Date Value  01/19/2019 8.3 (L)   Albumin (g/dL)  Date Value  01/17/2019 3.7  10/06/2018 4.5    Assessment/Plan: Last BMP was with am labs on 11/15. Will obtain BMP with am labs.   In setting of atrial fibrillation will replace for goal potassium ~ 4 and goal magnesium ~ 2.   Replacing potassium with 40 mEq oral KCl  Pharmacy will continue to monitor and adjust per consult.   Dallie Piles, PharmD 01/19/2019 9:27 AM

## 2019-01-19 NOTE — Progress Notes (Signed)
Overall he states that his pain is under much better control.  He is not short of breath.  He was able to walk out in the halls.  He got a little dizzy.  He has had several bowel movements and feels that his appetite is returning.  His wounds are all clean dry and intact.  I again changed all the dressings and retaped his chest tubes.  I have independently reviewed his chest x-ray from today.  There is still what appears to be collapse of his right lower lobe.  I do not appreciate an air leak from the chest tubes.  He did have a fairly vigorous cough and is coughing up a lot of mucus but there is no change in the overall appearance of the x-ray.  I did review with him the results of his pathology.  This is a stage I carcinoma of the lung.  I will get him seen by our oncologist once he is discharged.  I also discussed his care today with Dr. Lanney Gins.  He will see the patient to consider bronchoscopic examination for pulmonary toilet

## 2019-01-20 ENCOUNTER — Inpatient Hospital Stay (HOSPITAL_BASED_OUTPATIENT_CLINIC_OR_DEPARTMENT_OTHER): Payer: Medicare Other | Admitting: Pain Medicine

## 2019-01-20 ENCOUNTER — Inpatient Hospital Stay: Payer: Medicare Other | Admitting: Anesthesiology

## 2019-01-20 ENCOUNTER — Encounter: Payer: Self-pay | Admitting: Anesthesiology

## 2019-01-20 ENCOUNTER — Encounter
Admission: RE | Disposition: A | Discharge status: Home Health | Payer: Self-pay | Source: Home / Self Care | Attending: Cardiothoracic Surgery

## 2019-01-20 DIAGNOSIS — M79602 Pain in left arm: Secondary | ICD-10-CM

## 2019-01-20 DIAGNOSIS — M5441 Lumbago with sciatica, right side: Secondary | ICD-10-CM

## 2019-01-20 DIAGNOSIS — G894 Chronic pain syndrome: Secondary | ICD-10-CM

## 2019-01-20 DIAGNOSIS — M5442 Lumbago with sciatica, left side: Secondary | ICD-10-CM

## 2019-01-20 DIAGNOSIS — M79601 Pain in right arm: Secondary | ICD-10-CM | POA: Diagnosis not present

## 2019-01-20 DIAGNOSIS — M5412 Radiculopathy, cervical region: Secondary | ICD-10-CM

## 2019-01-20 DIAGNOSIS — G8929 Other chronic pain: Secondary | ICD-10-CM

## 2019-01-20 DIAGNOSIS — M79605 Pain in left leg: Secondary | ICD-10-CM | POA: Diagnosis not present

## 2019-01-20 HISTORY — PX: FLEXIBLE BRONCHOSCOPY: SHX5094

## 2019-01-20 LAB — BASIC METABOLIC PANEL
Anion gap: 8 (ref 5–15)
BUN: 19 mg/dL (ref 6–20)
CO2: 27 mmol/L (ref 22–32)
Calcium: 8.8 mg/dL — ABNORMAL LOW (ref 8.9–10.3)
Chloride: 102 mmol/L (ref 98–111)
Creatinine, Ser: 0.85 mg/dL (ref 0.61–1.24)
GFR calc Af Amer: >60 mL/min (ref >60)
GFR calc non Af Amer: >60 mL/min (ref >60)
Glucose, Bld: 138 mg/dL — ABNORMAL HIGH (ref 70–99)
Potassium: 3.5 mmol/L (ref 3.5–5.1)
Sodium: 137 mmol/L (ref 135–145)

## 2019-01-20 LAB — RENAL FUNCTION PANEL
Albumin: 3.5 g/dL (ref 3.5–5.0)
Anion gap: 13 (ref 5–15)
BUN: 18 mg/dL (ref 6–20)
CO2: 26 mmol/L (ref 22–32)
Calcium: 9 mg/dL (ref 8.9–10.3)
Chloride: 99 mmol/L (ref 98–111)
Creatinine, Ser: 0.99 mg/dL (ref 0.61–1.24)
GFR calc Af Amer: >60 mL/min (ref >60)
GFR calc non Af Amer: >60 mL/min (ref >60)
Glucose, Bld: 159 mg/dL — ABNORMAL HIGH (ref 70–99)
Phosphorus: 3.2 mg/dL (ref 2.5–4.6)
Potassium: 4 mmol/L (ref 3.5–5.1)
Sodium: 138 mmol/L (ref 135–145)

## 2019-01-20 LAB — MAGNESIUM: Magnesium: 1.9 mg/dL (ref 1.7–2.4)

## 2019-01-20 LAB — GLUCOSE, CAPILLARY
Glucose-Capillary: 152 mg/dL — ABNORMAL HIGH (ref 70–99)
Glucose-Capillary: 123 mg/dL — ABNORMAL HIGH (ref 70–99)
Glucose-Capillary: 125 mg/dL — ABNORMAL HIGH (ref 70–99)
Glucose-Capillary: 114 mg/dL — ABNORMAL HIGH (ref 70–99)
Glucose-Capillary: 134 mg/dL — ABNORMAL HIGH (ref 70–99)
Glucose-Capillary: 151 mg/dL — ABNORMAL HIGH (ref 70–99)

## 2019-01-20 SURGERY — BRONCHOSCOPY, FLEXIBLE
Anesthesia: General | Laterality: Bilateral

## 2019-01-20 MED ORDER — METOPROLOL TARTRATE 5 MG/5ML IV SOLN
5.0000 mg | Freq: Once | INTRAVENOUS | Status: AC
  Administered 2019-01-20: 14:00:00 5 mg via INTRAVENOUS
  Filled 2019-01-20: qty 5

## 2019-01-20 MED ORDER — METOPROLOL TARTRATE 5 MG/5ML IV SOLN
INTRAVENOUS | Status: AC
  Administered 2019-01-20: 5 mg via INTRAVENOUS
  Filled 2019-01-20: qty 5

## 2019-01-20 MED ORDER — OXYCODONE HCL 10 MG PO TABS: 10.0000 mg | ORAL_TABLET | Freq: Four times a day (QID) | ORAL | 0 refills | Status: DC | PRN

## 2019-01-20 MED ORDER — POTASSIUM CHLORIDE 20 MEQ PO PACK: 20.0000 meq | PACK | Freq: Once | ORAL | Status: DC

## 2019-01-20 MED ORDER — AMIODARONE HCL IN DEXTROSE 360-4.14 MG/200ML-% IV SOLN
60.0000 mg/h | INTRAVENOUS | Status: AC
  Administered 2019-01-20: 16:00:00 60 mg/h via INTRAVENOUS
  Filled 2019-01-20 (×2): qty 200

## 2019-01-20 MED ORDER — NONFORMULARY OR COMPOUNDED ITEM: 5 refills | Status: DC

## 2019-01-20 MED ORDER — AMIODARONE IV BOLUS ONLY 150 MG/100ML
INTRAVENOUS | Status: AC
  Administered 2019-01-20: 150 mg via INTRAVENOUS
  Filled 2019-01-20: qty 100

## 2019-01-20 MED ORDER — AMIODARONE HCL IN DEXTROSE 360-4.14 MG/200ML-% IV SOLN
30.0000 mg/h | INTRAVENOUS | Status: DC
  Administered 2019-01-20 – 2019-01-21 (×3): 30 mg/h via INTRAVENOUS
  Filled 2019-01-20 (×2): qty 200

## 2019-01-20 MED ORDER — METOPROLOL SUCCINATE ER 50 MG PO TB24: 25.0000 mg | ORAL_TABLET | Freq: Every day | ORAL | Status: DC

## 2019-01-20 MED ORDER — METOPROLOL TARTRATE 25 MG PO TABS
25.0000 mg | ORAL_TABLET | Freq: Four times a day (QID) | ORAL | Status: DC
  Administered 2019-01-21 – 2019-01-25 (×17): 25 mg via ORAL
  Filled 2019-01-20 (×18): qty 1

## 2019-01-20 MED ORDER — AMIODARONE IV BOLUS ONLY 150 MG/100ML
150.0000 mg | Freq: Once | INTRAVENOUS | Status: AC
  Administered 2019-01-20: 16:00:00 150 mg via INTRAVENOUS

## 2019-01-20 MED ORDER — POTASSIUM CHLORIDE 10 MEQ/100ML IV SOLN
10.0000 meq | INTRAVENOUS | Status: DC
  Filled 2019-01-20 (×2): qty 100

## 2019-01-20 NOTE — Progress Notes (Signed)
PT Cancellation Note  Patient Details Name: IOANNIS SCHUH MRN: [id number] DOB: Feb 07, 1961   Cancelled Treatment:    Reason Eval/Treat Not Completed: (PT entered room CNA and pt informed PT that patient had just ambulated ~2 laps of unit. Pt instructed to perform some seated exercises between ambulation as well. PT to re-attempt as able.)  Lieutenant Diego PT, DPT 11:31 AM,01/20/19 [phone]

## 2019-01-20 NOTE — Progress Notes (Signed)
Pt HR: 141. BP: 160/111. Dr. Amie Critchley at bedside. Orders received.

## 2019-01-20 NOTE — Progress Notes (Signed)
Pharmacy Electrolyte Monitoring Consult:  Pharmacy consulted to assist in monitoring and replacing electrolytes in this 58 y.o. male admitted on 01/14/2019 for video bronchoscopy and left thoracotomy due to a postsurgical bout of atrial fibrillation, currently resolved. Patient with CHADSVASc of 2 (Diabetes and Hypertension).   Labs:  Sodium (mmol/L)  Date Value  01/20/2019 137  10/06/2018 142   Potassium (mmol/L)  Date Value  01/20/2019 3.5   Magnesium (mg/dL)  Date Value  01/20/2019 1.9   Phosphorus (mg/dL)  Date Value  01/17/2019 3.1   Calcium (mg/dL)  Date Value  01/20/2019 8.8 (L)   Albumin (g/dL)  Date Value  01/17/2019 3.7  10/06/2018 4.5   Corrected Ca: 9.04 mg/dL  Assessment/Plan: In setting of atrial fibrillation will replace for goal potassium ~ 4 and goal magnesium ~ 2.   Replacing potassium with 20 mEq IV KCl  Magnesium is borderline at goal: no replacement for today  Pharmacy will continue to monitor and adjust per consult.   Dallie Piles, PharmD 01/20/2019 8:23 AM

## 2019-01-20 NOTE — Progress Notes (Signed)
Patient returned from PACU in Afib with HR in 160s. Unable to contact MD, Rapid called, transferred to step down.

## 2019-01-20 NOTE — Progress Notes (Signed)
Patient Name: Kristopher [name] Date of Encounter: 01/20/2019  Hospital Problem List     Active Problems:   Lung mass   Atrial fibrillation with RVR (HCC)   Shortness of breath   Acute respiratory failure with hypoxia [facility])    Patient Profile     Patient with history of a lung mass who underwent lobectomy.  Postoperative course complicated by atrial fibrillation with rapid ventricular response.  Patient received metoprolol and an IV amiodarone bolus and converted to sinus rhythm.  He has remained in sinus rhythm for approximately 72 hours.  He was taken back for consideration for a bronchoscopy today.  Preop developed recurrent A. fib with rapid ventricular response.  Was given a 10 mg bolus of IV metoprolol and transferred to the ICU.  Currently is in atrial fibrillation with rapid ventricular response.  Hemodynamically stable.  Given amiodarone bolus with improvement in his heart rate.  Still in atrial fibrillation with rapid ventricular response  Subjective   Mild diaphoresis and nausea.  No chest pain.  Inpatient Medications    . acetylcysteine  4 mL Nebulization TID  . atorvastatin  10 mg Oral Daily  . bisacodyl  10 mg Oral Daily  . Chlorhexidine Gluconate Cloth  6 each Topical Daily  . enoxaparin (LOVENOX) injection  40 mg Subcutaneous Q24H  . guaiFENesin  600 mg Oral BID  . hydrALAZINE  100 mg Oral BID  . insulin aspart  0-5 Units Subcutaneous QHS  . insulin aspart  0-9 Units Subcutaneous TID WC  . ipratropium-albuterol  3 mL Nebulization Q6H  . mouth rinse  15 mL Mouth Rinse BID  . metFORMIN  1,000 mg Oral BID WC  . methocarbamol  750 mg Oral QID  . metoprolol succinate  125 mg Oral BID  . montelukast  10 mg Oral QHS  . nicotine  21 mg Transdermal Daily  . nortriptyline  50 mg Oral QHS  . oxyCODONE  10 mg Oral QID  . pantoprazole  40 mg Oral Daily  . polyethylene glycol  17 g Oral Daily  . senna-docusate  2 tablet Oral BID    Vital Signs    Vitals:    01/20/19 1411 01/20/19 1423 01/20/19 1430 01/20/19 1458  BP: (!) 153/114 (!) 176/110 (!) 153/115 (!) 144/106  Pulse:    81  Resp: (!) 21 (!) 31 (!) 22   Temp:      TempSrc:      SpO2: 94% 94% 96% 95%  Weight:      Height:        Intake/Output Summary (Last 24 hours) at 01/20/2019 1606 Last data filed at 01/20/2019 1009 Gross per 24 hour  Intake 240 ml  Output 450 ml  Net -210 ml   Filed Weights   01/14/19 1112 01/20/19 1313  Weight: 106.4 kg 106.4 kg    Physical Exam    GEN: Well nourished, well developed, in no acute distress.  HEENT: normal.  Neck: Supple, no JVD, carotid bruits, or masses. Cardiac: Irregular regular rhythm with tachycardia Respiratory: Decreased breath sounds bilaterally GI: Soft, nontender, nondistended, BS + x 4. MS: no deformity or atrophy. Skin: warm and dry, no rash. Neuro:  Strength and sensation are intact. Psych: Normal affect.  Labs    CBC Recent Labs    01/18/19 0439  WBC 13.0*  HGB 13.5  HCT 40.9  MCV 89.1  PLT 818   Basic Metabolic Panel Recent Labs    01/19/19 0431 01/20/19 0432  NA 134* 137  K 3.4* 3.5  CL 100 102  CO2 27 27  GLUCOSE 132* 138*  BUN 23* 19  CREATININE 0.97 0.85  CALCIUM 8.3* 8.8*  MG  --  1.9   Liver Function Tests No results for input(s): AST, ALT, ALKPHOS, BILITOT, PROT, ALBUMIN in the last 72 hours. No results for input(s): LIPASE, AMYLASE in the last 72 hours. Cardiac Enzymes No results for input(s): CKTOTAL, CKMB, CKMBINDEX, TROPONINI in the last 72 hours. BNP No results for input(s): BNP in the last 72 hours. D-Dimer No results for input(s): DDIMER in the last 72 hours. Hemoglobin A1C No results for input(s): HGBA1C in the last 72 hours. Fasting Lipid Panel No results for input(s): CHOL, HDL, LDLCALC, TRIG, CHOLHDL, LDLDIRECT in the last 72 hours. Thyroid Function Tests No results for input(s): TSH, T4TOTAL, T3FREE, THYROIDAB in the last 72 hours.  Invalid input(s):  FREET3  Telemetry    Atrial fibrillation rapid ventricular response  ECG    Atrial fibrillation with rapid ventricular response no ischemia  Radiology    Dg Chest 1 View  Result Date: 01/16/2019 CLINICAL DATA:  Short of breath. Follow-up chest tube. EXAM: CHEST  1 VIEW COMPARISON:  01/15/2019 FINDINGS: Left-sided chest tube is in place. No significant pneumothorax visualized. Decreased lung volumes. Pulmonary vascular congestion. Atelectasis noted in the left base. IMPRESSION: 1. No significant change in aeration to the left lung. 2. No significant pneumothorax noted. 3. Pulmonary vascular congestion. Electronically Signed   By: Kerby Moors M.D.   On: 01/16/2019 05:42   Dg Chest 2 View  Result Date: 01/19/2019 CLINICAL DATA:  Postoperative check, left-sided chest tube in place. EXAM: CHEST - 2 VIEW COMPARISON:  01/18/2019 FINDINGS: Two left-sided chest tubes remain in place. Tiny left pneumothorax is noted along the periphery of the left chest. Chest tube position similar to prior study. Lobar airspace disease/collapse on the right as before. Probable small left effusion. Dilated bowel loops in the upper abdomen as before. Cardiomediastinal contours are stable. IMPRESSION: 1. Tiny left pneumothorax along the periphery of the left chest. Two left-sided chest tubes remain in place. 2. Redemonstration of volume loss and or consolidation in the right lung base and dilated bowel loops in the upper abdomen. Electronically Signed   By: Zetta Bills M.D.   On: 01/19/2019 10:02   Dg Chest 2 View  Result Date: 01/15/2019 CLINICAL DATA:  Postoperative EXAM: CHEST - 2 VIEW COMPARISON:  01/14/2019 FINDINGS: No significant change in examination status post left thoracotomy with left-sided chest tubes in position and no significant pneumothorax. Very low volume examination with mild diffuse interstitial opacity, likely bronchovascular crowding. No new airspace opacity. Mild cardiomegaly. Subcutaneous  emphysema about the left chest wall. IMPRESSION: 1. No significant change in examination status post left thoracotomy with left-sided chest tubes in position and no significant pneumothorax. 2. Very low volume examination with mild diffuse interstitial opacity, likely bronchovascular crowding. No new airspace opacity. Electronically Signed   By: Eddie Candle M.D.   On: 01/15/2019 09:19   Dg Abd 1 View  Result Date: 01/17/2019 CLINICAL DATA:  Abdominal distention. EXAM: ABDOMEN - 1 VIEW COMPARISON:  01/16/2019 FINDINGS: Moderate distension of the stomach is again noted. There is persistent gaseous distension of the colon. Decreased stool burden noted on today's exam. IMPRESSION: Persistent gaseous distension of the large bowel loops with interval decrease in overall stool burden. Electronically Signed   By: Kerby Moors M.D.   On: 01/17/2019 06:05   Dg  Chest Port 1 View  Result Date: 01/18/2019 CLINICAL DATA:  Chest tube in place EXAM: PORTABLE CHEST 1 VIEW COMPARISON:  Two days ago FINDINGS: Low volume chest with interval lobar collapse at the right base. Stable heart size accentuated by low volumes. No edema, effusion, or pneumothorax. Large bore chest tube at the left base. IMPRESSION: Interval lobar collapse at the right base.  Lung volumes are low. No visible pneumothorax. Electronically Signed   By: Monte Fantasia M.D.   On: 01/18/2019 06:53   Dg Chest Port 1 View  Result Date: 01/14/2019 CLINICAL DATA:  Postop thoracotomy, left. EXAM: PORTABLE CHEST 1 VIEW COMPARISON:  Preoperative radiograph 12/16/2018, CT 11/17/2018 FINDINGS: Two left-sided chest tubes in place, 1 directed towards the apex, 1 towards the base. No definite visualized pneumothorax. Subcutaneous emphysema about the left lateral chest wall with adjacent skin staples. Lung volumes are low. Upper normal heart size, accentuated by low lung volumes. Bronchovascular crowding without pulmonary edema. No focal abnormality in the right  lung. IMPRESSION: 1. Two left-sided chest tubes in place. No definite pneumothorax. Subcutaneous emphysema about the left lateral chest wall. 2. Low lung volumes with bronchovascular crowding. Electronically Signed   By: Keith Rake M.D.   On: 01/14/2019 17:59   Dg Abd Portable 2v  Result Date: 01/16/2019 CLINICAL DATA:  Short of breath. Chest tube. EXAM: PORTABLE ABDOMEN - 2 VIEW COMPARISON:  10/30/2017 FINDINGS: Moderate distension of the stomach. There is diffuse gaseous distension of the colon with a moderate stool burden identified within the right colon and descending colon. No significant small bowel dilatation identified. Previous left hip arthroplasty and anterior disc fusion of the lower lumbar spine. IMPRESSION: 1. Moderate gaseous distension of the, nonspecific. This may reflect colonic ileus versus distal bowel obstruction. Clinical correlation advised. 2. Moderate stool burden within the right colon and descending colon. Electronically Signed   By: Kerby Moors M.D.   On: 01/16/2019 05:44    Assessment & Plan    #1.  Atrial fibrillation-A. fib post thoracotomy.  Had converted back to sinus rhythm and remained in sinus rhythm.  Now back in atrial fibrillation rapid ventricular response prior to her bronchoscopy.  Has received 10 mg of IV metoprolol.  Will give a bolus of amiodarone and placed on an amiodarone drip.  Echocardiogram revealed normal LV function.  Will likely need to remain on an antiarrhythmic agent throughout the remainder of his hospitalization until his chest tube is removed and his pulmonary secretions improve.  2.  Lung mass-being treated by Dr. Genevive Bi.  Signed, Javier Docker Araminta Zorn MD 01/20/2019, 4:06 PM  Pager: [phone]

## 2019-01-20 NOTE — Progress Notes (Signed)
Pt awake on 2L [place] 95% at this time. Pt refuses to take Mucomyst, pt stated that the taste will remain in his mouth all night and did not want it and will resume in the am. Pt also refused to do the Metaneb, stated he doesn't like the flutter and the bloweng of air. Pt stated the Metaneb would keep him up and make it difficult to go back to sleep. Pt would only take SVN with Duoneb.

## 2019-01-20 NOTE — Progress Notes (Signed)
PT Cancellation Note  Patient Details Name: Kristopher [name] MRN: [id number] DOB: Nov 27, 1960   Cancelled Treatment:    Reason Eval/Treat Not Completed: Other (comment)(Pt out of room at this time, PT will follow up as able.)   Lieutenant Diego PT, DPT 1:41 PM,01/20/19 [phone]

## 2019-01-20 NOTE — Progress Notes (Signed)
Pt hooked up to the monitor. Pt is in A-fib. HR 144. Dr. Amie Critchley notified. Acknowledged. Order received.

## 2019-01-20 NOTE — Progress Notes (Signed)
Pt HR @115  to 130's in A-fib;  scheduled metoprolol 125 mg p.o. given. No change in HR. Dr . Loni Muse. Parascho notified. Order given to decrease Amiodarone gtt from 60 mg to 30mg . Then change metoprolol to 25 mg p.o. Q 6 hours. Will continue to monitor pt closely.

## 2019-01-20 NOTE — Anesthesia Preprocedure Evaluation (Addendum)
Anesthesia Evaluation  Patient identified by MRN, date of birth, ID band Patient awake    Reviewed: Allergy & Precautions, H&P , NPO status , Patient's Chart, lab work & pertinent test results, reviewed documented beta blocker date and time   History of Anesthesia Complications Negative for: history of anesthetic complications  Airway Mallampati: III  TM Distance: <3 FB Neck ROM: limited    Dental  (+) Chipped, Poor Dentition   Pulmonary shortness of breath, neg sleep apnea, pneumonia, COPD, Patient abstained from smoking.Not current smoker, former smoker,           Cardiovascular hypertension, Pt. on medications and Pt. on home beta blockers (-) Past MI and (-) CHF + dysrhythmias Atrial Fibrillation (-) Valvular Problems/Murmurs     Neuro/Psych  Headaches, neg Seizures  Neuromuscular disease negative psych ROS   GI/Hepatic Neg liver ROS, GERD  Medicated and Controlled,  Endo/Other  diabetes, Type 2, Oral Hypoglycemic Agents  Renal/GU Renal InsufficiencyRenal disease     Musculoskeletal  (+) Arthritis ,   Abdominal   Peds  Hematology negative hematology ROS (+)   Anesthesia Other Findings Past Medical History: No date: Allergy No date: Arthritis     Comment:  left foot No date: Benign hypertensive kidney disease No date: Chronic back pain     Comment:  Four rods in back No date: Diabetes mellitus, type 2 (HCC) No date: Dyspnea No date: GERD (gastroesophageal reflux disease) No date: Hypertension No date: Migraines     Comment:  daily  Past Surgical History: No date: APPENDECTOMY No date: BACK SURGERY 02/19/2016: COLONOSCOPY WITH PROPOFOL; N/A     Comment:  Procedure: COLONOSCOPY WITH PROPOFOL;  Surgeon: Lucilla Lame, MD;  Location: [place];  Service:               Endoscopy;  Laterality: N/A; 01/19/2018: COLONOSCOPY WITH PROPOFOL; N/A     Comment:  Procedure: COLONOSCOPY WITH  PROPOFOL;  Surgeon: Lucilla Lame, MD;  Location: [place];  Service:               Endoscopy;  Laterality: N/A;  Diabetic - oral meds No date: DG OPERATIVE LEFT HIP [facility] HX)     Comment:  10/19 11/18/2018: ELECTROMAGNETIC NAVIGATION BROCHOSCOPY; Left     Comment:  Procedure: ELECTROMAGNETIC NAVIGATION BRONCHOSCOPY;                Surgeon: Tyler Pita, MD;  Location: [facility];                Service: Cardiopulmonary;  Laterality: Left; No date: FOOT SURGERY; Left     Comment:  Screws and plates 12/2017: JOINT REPLACEMENT; Left     Comment:  DR Rudene Christians Hip No date: KNEE SURGERY; Left     Comment:  X 2 No date: LEG SURGERY 02/19/2016: POLYPECTOMY; N/A     Comment:  Procedure: POLYPECTOMY;  Surgeon: Lucilla Lame, MD;                Location: [place];  Service: Endoscopy;                Laterality: N/A; 01/19/2018: POLYPECTOMY     Comment:  Procedure: POLYPECTOMY;  Surgeon: Lucilla Lame, MD;                [facility];  Service: Endoscopy;; 01/14/2019: THORACOTOMY; Left     Comment:  Procedure: THORACOTOMY MAJOR, LEFT;  Surgeon: Nestor Lewandowsky, MD;  Location: [facility];  Service: General;                Laterality: Left; 12/02/2017: TOTAL HIP ARTHROPLASTY; Left     Comment:  Procedure: TOTAL HIP ARTHROPLASTY ANTERIOR APPROACH;                Surgeon: Hessie Knows, MD;  Location: [facility];                Service: Orthopedics;  Laterality: Left; 01/14/2019: VIDEO BRONCHOSCOPY; Left     Comment:  Procedure: VIDEO BRONCHOSCOPY WITH FLUORO, LEFT;                Surgeon: Nestor Lewandowsky, MD;  Location: [facility];                Service: General;  Laterality: Left;  BMI    Body Mass Index: 33.66 kg/m      Reproductive/Obstetrics                            Anesthesia Physical  Anesthesia Plan  ASA: IV  Anesthesia Plan: General   Post-op Pain Management:    Induction: Intravenous  PONV  Risk Score and Plan: 2 and Ondansetron and Dexamethasone  Airway Management Planned: Oral ETT and Video Laryngoscope Planned  Additional Equipment:   Intra-op Plan:   Post-operative Plan: Extubation in OR  Informed Consent: I have reviewed the patients History and Physical, chart, labs and discussed the procedure including the risks, benefits and alternatives for the proposed anesthesia with the patient or authorized representative who has indicated his/her understanding and acceptance.     Dental Advisory Given  Plan Discussed with: Anesthesiologist, CRNA and Surgeon  Anesthesia Plan Comments: (Patient with known history of A fib, now with HR of 140's pre Op.  Plan to try to rate control with IV meds in preOp on monitors for the procedure   Patient consented for risks of anesthesia including but not limited to:  - adverse reactions to medications - damage to teeth, lips or other oral mucosa - sore throat or hoarseness - Damage to heart, brain, lungs or loss of life  Patient voiced understanding.)       Anesthesia Quick Evaluation

## 2019-01-20 NOTE — TOC Initial Note (Signed)
Transition of Care [facility]) - Initial/Assessment Note    Patient Details  Name: Kristopher [name] MRN: [id number] Date of Birth: [birth year]  Transition of Care [facility]) CM/SW Contact:    Trecia Rogers, LCSW Phone Number: 01/20/2019, 1:38 PM  Clinical Narrative:                  Patient was admitted from home with lung mass. Status post: lobectomy. Patient lives with his girlfriend and grandchild. PCP, [facility] ([place] Denies issues obtaining medications. At baseline patient is independent and can drive. After discharge, pt's girlfriend can assist with taking him to appointments and assist with driving. PT has accessed patient and recommended home health PT. Patient is in agreement and stated that he has had [facility] in the past, however does not have a preference regarding agency. Patient reports that he has a walker and cane at home for ambulation. Referral sent to [place] [facility] [place] at [facility]. Patient currently on room air at rest. Will follow for DME needs.   Expected Discharge Plan: [place] Barriers to Discharge: Continued Medical Work up   Patient Goals and CMS Choice Patient states their goals for this hospitalization and ongoing recovery are:: "to go home after discharge" CMS Medicare.gov Compare Post Acute Care list provided to:: Patient Choice offered to / list presented to : Patient  Expected Discharge Plan and Services Expected Discharge Plan: [place]   Discharge Planning Services: CM Consult Post Acute Care Choice: [place] arrangements for the past 2 months: [place]: PT, RN [place] Agency: [facility] [employer] [facility] [employer] Date [place]: 01/20/19 Time [place]: 1001 Representative spoke with at [place] at [facility]  Prior Living Arrangements/Services Living arrangements for the past 2 months:  [place] with:: Significant Other, Minor Children Patient language and need for interpreter reviewed:: Yes Do you feel safe going back to the place where you live?: Yes      Need for Family Participation in Patient Care: Yes (Comment) Care giver support system in place?: Yes (comment) Current home services: DME Criminal Activity/Legal Involvement Pertinent to Current Situation/Hospitalization: No - Comment as needed  Activities of Daily Living Home Assistive Devices/Equipment: None ADL Screening (condition at time of admission) Patient's cognitive ability adequate to safely complete daily activities?: Yes Is the patient deaf or have difficulty hearing?: No Does the patient have difficulty seeing, even when wearing glasses/contacts?: No Does the patient have difficulty concentrating, remembering, or making decisions?: No Patient able to express need for assistance with ADLs?: Yes Does the patient have difficulty dressing or bathing?: Yes Independently performs ADLs?: Yes (appropriate for developmental age) Does the patient have difficulty walking or climbing stairs?: Yes Weakness of Legs: None Weakness of Arms/Hands: None  Permission Sought/Granted                  Emotional Assessment Appearance:: Appears stated age Attitude/Demeanor/Rapport: Engaged, Gracious Affect (typically observed): Accepting Orientation: : Oriented to Self, Oriented to Place, Oriented to  Time, Oriented to Situation   Psych Involvement: No (comment)  Admission diagnosis:  LUNG MASS Patient Active Problem List   Diagnosis Date Noted  . Chronic upper extremity pain (Third area of Pain) (Bilateral) (L>R) 01/19/2019  .  Squamous cell carcinoma of left lung ([place]) 01/16/2019  . Atrial fibrillation with RVR ([place])   . Shortness of breath   . Acute respiratory failure with hypoxia ([place])   . Lung mass 01/14/2019  . Left lower lobe pulmonary nodule 10/26/2018  . History of Allergy to amide  type local anesthetic (Lidocaine) 10/21/2018    Class: History of  . Hyperlipidemia associated with type 2 diabetes mellitus ([place]) 07/20/2018  . History of colonic polyps   . Type 2 diabetes mellitus with stage 1 chronic kidney disease, without long-term current use of insulin ([place]) 12/30/2017  . S/P hip replacement 12/02/2017  . Hip arthritis 10/01/2017  . Aortic atherosclerosis ([place]) 08/25/2017  . Left-sided weakness 08/13/2017  . Musculoskeletal pain, chronic 04/03/2017  . Tobacco abuse 06/24/2016  . GERD (gastroesophageal reflux disease) 06/24/2016  . Special screening for malignant neoplasms, colon   . Benign neoplasm of ascending colon   . Polyp of sigmoid colon   . Rectal polyp   . Benign hypertensive renal disease 01/22/2016  . Migraine 01/22/2016  . Chronic pain syndrome 01/11/2016  . Chronic sacroiliac joint pain (Bilateral) (L>R) 05/31/2015  . Allergy history, anesthetic (Unconfirmed allergy to Lidocaine) 05/31/2015  . Chronic hip pain (Left) 05/31/2015  . Lumbar facet syndrome (Location of Primary Source of Pain) (Bilateral) (L>R) 05/31/2015  . Chronic lower extremity pain (Secondary area of Pain) (Left) 05/31/2015  . Greater occipital neuralgia (Right) 05/31/2015  . Retrolisthesis of L5-S1 02/06/2015  . Cervical disc herniation (C4-5 and C5-6) 02/06/2015  . Lumbar disc herniation (L5-S1) 02/06/2015  . Hypokalemia 02/01/2015  . Cervical spinal stenosis (C4-5) 01/06/2015  . Cervical foraminal stenosis (Bilateral C5-6) 01/06/2015  . Chronic low back pain (Primary Area of Pain) (Bilateral) (L>R) 01/05/2015  . Lumbar spondylosis 01/05/2015  . Chronic lumbar radicular pain (Secondary area of Pain) (S1 dermatomal) (Left) 01/05/2015  . 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) 01/05/2015  . Chronic neck pain (posterior midline) (Bilateral) (L>R) 01/05/2015  . Cervical spondylosis 01/05/2015  . Chronic cervical radicular pain (Third area of Pain)  (Bilateral) (C5/C6 dermatome) (L>R) 01/05/2015  . Long term current use of opiate analgesic 01/05/2015  . Long term prescription opiate use 01/05/2015  . Opiate use (60 MME/Day) 01/05/2015  . Opioid dependence ([place]) 01/05/2015  . Encounter for therapeutic drug level monitoring 01/05/2015   PCP:  [name], DO Pharmacy:   [id number] - [place], [place] [address] [place] [address] Phone: [phone] Fax: [phone]  [place], [place] - [address] [place] [address] Phone: [phone] Fax: [phone]     Social Determinants of Health (SDOH) Interventions    Readmission Risk Interventions Readmission Risk Prevention Plan 01/20/2019  Transportation Screening Complete  HRI or Home Care Consult Complete  Palliative Care Screening Not Applicable  Medication Review (RN Care Manager) Complete  Some recent data might be hidden

## 2019-01-20 NOTE — Progress Notes (Signed)
Dr. Amie Critchley and Dr. Lanney Gins at bedside. Pt HR: 138 after 10mg  IV Metoprolol. BP: 156/117 Dr. Amie Critchley and Dr. Lanney Gins agreed to cancel procedure at this time due to HR.

## 2019-01-20 NOTE — Significant Event (Signed)
Rapid Response Event Note  Overview: Time Called: 1525 Arrival Time: 1527 Event Type: Cardiac  Initial Focused Assessment: called for RR for patient in Afib/RVR   Interventions:transfered to Stepdown for Amio bolus and further care, Dr Genevive Bi and Dr Ubaldo Glassing at bedside.  Plan of Care (if not transferred):  Event Summary: Name of Physician Notified: oaks at 1525  Name of Consulting Physician Notified: fath at 1540  Outcome: Transferred (Comment)  Event End Time: [place]

## 2019-01-21 ENCOUNTER — Inpatient Hospital Stay: Payer: Medicare Other

## 2019-01-21 ENCOUNTER — Encounter: Payer: Self-pay | Admitting: Pulmonary Disease

## 2019-01-21 LAB — GLUCOSE, CAPILLARY
Glucose-Capillary: 155 mg/dL — ABNORMAL HIGH (ref 70–99)
Glucose-Capillary: 139 mg/dL — ABNORMAL HIGH (ref 70–99)
Glucose-Capillary: 123 mg/dL — ABNORMAL HIGH (ref 70–99)
Glucose-Capillary: 141 mg/dL — ABNORMAL HIGH (ref 70–99)

## 2019-01-21 LAB — MAGNESIUM: Magnesium: 2 mg/dL (ref 1.7–2.4)

## 2019-01-21 MED ORDER — ACETYLCYSTEINE 20 % IN SOLN
4.0000 mL | Freq: Two times a day (BID) | RESPIRATORY_TRACT | Status: DC
  Filled 2019-01-21 (×4): qty 4

## 2019-01-21 MED ORDER — CHLORHEXIDINE GLUCONATE CLOTH 2 % EX PADS: 6.0000 | MEDICATED_PAD | Freq: Every day | CUTANEOUS | Status: DC

## 2019-01-21 MED ORDER — METHOCARBAMOL 500 MG PO TABS
1000.0000 mg | ORAL_TABLET | Freq: Four times a day (QID) | ORAL | Status: DC
  Administered 2019-01-21 – 2019-01-22 (×4): 1000 mg via ORAL
  Filled 2019-01-21 (×12): qty 2

## 2019-01-21 MED ORDER — DILTIAZEM HCL 60 MG PO TABS
60.0000 mg | ORAL_TABLET | Freq: Four times a day (QID) | ORAL | Status: DC
  Administered 2019-01-21 – 2019-01-23 (×8): 60 mg via ORAL
  Filled 2019-01-21 (×9): qty 1

## 2019-01-21 MED ORDER — FUROSEMIDE 10 MG/ML IJ SOLN
80.0000 mg | Freq: Once | INTRAMUSCULAR | Status: AC
  Administered 2019-01-21: 80 mg via INTRAVENOUS
  Filled 2019-01-21: qty 8

## 2019-01-21 MED ORDER — AMIODARONE HCL 200 MG PO TABS
400.0000 mg | ORAL_TABLET | Freq: Two times a day (BID) | ORAL | Status: DC
  Administered 2019-01-21 – 2019-01-23 (×4): 400 mg via ORAL
  Filled 2019-01-21 (×4): qty 2

## 2019-01-21 NOTE — Progress Notes (Signed)
PT Cancellation Note  Patient Details Name: Kristopher [name] MRN: [id number] DOB: [birth year]   Cancelled Treatment:    Reason Eval/Treat Not Completed: Other (comment) Per chart review, pt transferred to CCU due to afib with RVR after procedure 11/18. Per PT guidelines, PT to complete PT orders due to change in medical status. Please re-consult when patient is appropriate for exertional activity/medically stable.)  Lieutenant Diego PT, DPT 8:28 AM,01/21/19 [phone]

## 2019-01-21 NOTE — Progress Notes (Signed)
Pt in A -fib with RVR @rate  of 130-140's sustained; BP=158/108(122) hydralazine 10 mg prn given. Dr. Idelle Leech notified. See new order.

## 2019-01-21 NOTE — Progress Notes (Signed)
Called patient and he was still in the hospital after having procedure. He is sore but doing well.

## 2019-01-21 NOTE — Progress Notes (Signed)
[facility] Cardiology  SUBJECTIVE: The patient currently denies chest pain, shortness of breath, or palpitations. Last night while in rapid atrial fibrillation, he was symptomatic with heart pounding sensation.   Vitals:   01/21/19 0545 01/21/19 0600 01/21/19 0700 01/21/19 0740  BP:  (!) 141/98 (!) 134/96 (!) 134/96  Pulse: 93 91 84 84  Resp:  18 17 20   Temp:    97.6 F (36.4 C)  TempSrc:    Oral  SpO2:  97% 97% 96%  Weight:      Height:         Intake/Output Summary (Last 24 hours) at 01/21/2019 9417 Last data filed at 01/20/2019 1009 Gross per 24 hour  Intake 240 ml  Output -  Net 240 ml      PHYSICAL EXAM  General: Well developed, well nourished, lying supine in bed resting in no acute distress HEENT:  Normocephalic and atramatic Neck:  No JVD.  Lungs: normal effort of breathing on room air. Heart: HRRR . Normal S1 and S2 without gallops or murmurs.  Abdomen: nondistended Msk: gait not assessed. No obvious deformity.  Extremities: No clubbing, cyanosis or edema.   Neuro: Alert and oriented X 3. Psych:  Good affect, responds appropriately   LABS: Basic Metabolic Panel: Recent Labs    01/20/19 0432 01/20/19 1608 01/21/19 0506  NA 137 138  --   K 3.5 4.0  --   CL 102 99  --   CO2 27 26  --   GLUCOSE 138* 159*  --   BUN 19 18  --   CREATININE 0.85 0.99  --   CALCIUM 8.8* 9.0  --   MG 1.9  --  2.0  PHOS  --  3.2  --    Liver Function Tests: Recent Labs    01/20/19 1608  ALBUMIN 3.5   No results for input(s): LIPASE, AMYLASE in the last 72 hours. CBC: No results for input(s): WBC, NEUTROABS, HGB, HCT, MCV, PLT in the last 72 hours. Cardiac Enzymes: No results for input(s): CKTOTAL, CKMB, CKMBINDEX, TROPONINI in the last 72 hours. BNP: Invalid input(s): POCBNP D-Dimer: No results for input(s): DDIMER in the last 72 hours. Hemoglobin A1C: No results for input(s): HGBA1C in the last 72 hours. Fasting Lipid Panel: No results for input(s): CHOL, HDL,  LDLCALC, TRIG, CHOLHDL, LDLDIRECT in the last 72 hours. Thyroid Function Tests: No results for input(s): TSH, T4TOTAL, T3FREE, THYROIDAB in the last 72 hours.  Invalid input(s): FREET3 Anemia Panel: No results for input(s): VITAMINB12, FOLATE, FERRITIN, TIBC, IRON, RETICCTPCT in the last 72 hours.  Dg Chest 2 View  Result Date: 01/19/2019 CLINICAL DATA:  Postoperative check, left-sided chest tube in place. EXAM: CHEST - 2 VIEW COMPARISON:  01/18/2019 FINDINGS: Two left-sided chest tubes remain in place. Tiny left pneumothorax is noted along the periphery of the left chest. Chest tube position similar to prior study. Lobar airspace disease/collapse on the right as before. Probable small left effusion. Dilated bowel loops in the upper abdomen as before. Cardiomediastinal contours are stable. IMPRESSION: 1. Tiny left pneumothorax along the periphery of the left chest. Two left-sided chest tubes remain in place. 2. Redemonstration of volume loss and or consolidation in the right lung base and dilated bowel loops in the upper abdomen. Electronically Signed   By: Zetta Bills M.D.   On: 01/19/2019 10:02   Dg Chest Port 1 View  Result Date: 01/21/2019 CLINICAL DATA:  Recent left-sided thoracotomy and video bronchoscopy EXAM: PORTABLE CHEST 1 VIEW  COMPARISON:  January 19, 2019 FINDINGS: Chest tubes on the left are unchanged in position. Minimal pneumothorax on the left laterally is again noted without tension component. Consolidation in the medial right base is stable. No new opacity evident. No appreciable pleural effusion. Heart size and pulmonary vascularity are normal. No adenopathy. No bone lesions. IMPRESSION: Stable minimal pneumothorax on the left laterally without tension component. No change in chest tube positions. Consolidation medial right base is stable. No new opacity. Stable cardiac silhouette. Electronically Signed   By: Lowella Grip III M.D.   On: 01/21/2019 08:26     Echo LVEF  55-60%, mild TR  TELEMETRY: sinus rhythm, 84 bpm  ASSESSMENT AND PLAN:  Active Problems:   Lung mass   Atrial fibrillation with RVR (HCC)   Shortness of breath   Acute respiratory failure with hypoxia (HCC)    1. Atrial fibrillation status post thoracotomy. Converted to sinus rhythm, then went back into rapid atrial fibrillation prior to bronchoscopy. Received IV metoprolol, amiodarone bolus, then started on amiodarone drip. Herat rate increased again last night to 115-130 bpm in atrial fibrillation; patient's scheduled metoprolol 125 mg was given with no improvement of heart rate. Amiodarone drip was decreased from 60 mg to 30 mg, and oral metoprolol was changed to 25 mg q 6 hours. Due to persistently sustained RVR, Cardizem 60 mg q 6 hours was added to regimen. The patient is currently in sinus rhythm with rates in the 80s, and feels better. 2D echocardiogram revealed normal left ventricular function with LVEF 55-60%.  2. Lung mass, status post thoracotomy, managed by Dr. Genevive Bi.  Recommendations: 1. Continue amiodarone drip at current dose/rate at this time. Continue metoprolol 25 mg q 6, and Cardizem 60 mg q 6 until tomorrow. 2. Goal would be to transition amiodarone to oral, consolidate metoprolol to twice daily dosing, and cardizem to once daily dosing. 3. Continue to defer chronic anticoagulation at this time as atrial fibrillation likely due to reversible cause, and still currently has chest tube in place. 4. Dr. Ubaldo Glassing will resume patient's care tomorrow.  Clabe Seal, PA-C 01/21/2019 8:33 AM  Discussed with Dr. Saralyn Pilar; plan made in collaboration with him.

## 2019-01-21 NOTE — Progress Notes (Signed)
Pharmacy Electrolyte Monitoring Consult:  Pharmacy consulted to assist in monitoring and replacing electrolytes in this 58 y.o. male admitted on 01/14/2019 for video bronchoscopy and left thoracotomy due to a postsurgical bout of atrial fibrillation, currently resolved. Patient with CHADSVASc of 2 (Diabetes and Hypertension).   Labs:  Sodium (mmol/L)  Date Value  01/20/2019 138  10/06/2018 142   Potassium (mmol/L)  Date Value  01/20/2019 4.0   Magnesium (mg/dL)  Date Value  01/21/2019 2.0   Phosphorus (mg/dL)  Date Value  01/20/2019 3.2   Calcium (mg/dL)  Date Value  01/20/2019 9.0   Albumin (g/dL)  Date Value  01/20/2019 3.5  10/06/2018 4.5   Corrected Ca: 9.04 mg/dL  Assessment/Plan: In setting of atrial fibrillation will replace for goal potassium ~ 4 and goal magnesium ~ 2.   -No replacement warranted based on afternoon BMP from 11/18.  -Will obtain BMP with am labs.   Pharmacy will continue to monitor and adjust per consult.   Simpson,Aalijah L, PharmD 01/21/2019 10:08 AM

## 2019-01-21 NOTE — Progress Notes (Signed)
Overall he still complains of some discomfort.  He is not short of breath.  He states that he has been eating well and been passing gas.  He is now back in sinus rhythm.  His wounds are clean dry and intact.  The chest tube drainage is serous.  There is no air leak from the tubes.  We will continue aggressive pulmonary toilet.  He will be reassessed for bronchoscopy tomorrow.  We will remove his posterior chest tube today.

## 2019-01-21 NOTE — Progress Notes (Signed)
CRITICAL CARE PROGRESS NOTE    Name: Kristopher [name] MRN: [id number] DOB: Jul 28, 1960     LOS: 7   SUBJECTIVE FINDINGS & SIGNIFICANT EVENTS   Patient is resting in bed in no Distress.  He remains on amiodarone drip, apparently he must have a separate nurse or be off of drip prior to having bronchoscopy in am so we are actively working on optimizing his AF to not need any continuous infusions.   PAST MEDICAL HISTORY   Past Medical History:  Diagnosis Date  . Allergy   . Arthritis    left foot  . Benign hypertensive kidney disease   . Chronic back pain    Four rods in back  . Diabetes mellitus, type 2 ([place])   . Dyspnea   . GERD (gastroesophageal reflux disease)   . Hypertension   . Migraines    daily     SURGICAL HISTORY   Past Surgical History:  Procedure Laterality Date  . APPENDECTOMY    . BACK SURGERY    . COLONOSCOPY WITH PROPOFOL N/A 02/19/2016   Procedure: COLONOSCOPY WITH PROPOFOL;  Surgeon: Lucilla Lame, MD;  Location: [place];  Service: Endoscopy;  Laterality: N/A;  . COLONOSCOPY WITH PROPOFOL N/A 01/19/2018   Procedure: COLONOSCOPY WITH PROPOFOL;  Surgeon: Lucilla Lame, MD;  Location: [place];  Service: Endoscopy;  Laterality: N/A;  Diabetic - oral meds  . DG OPERATIVE LEFT HIP ([place] HX)     10/19  . ELECTROMAGNETIC NAVIGATION BROCHOSCOPY Left 11/18/2018   Procedure: ELECTROMAGNETIC NAVIGATION BRONCHOSCOPY;  Surgeon: Tyler Pita, MD;  Location: [facility];  Service: Cardiopulmonary;  Laterality: Left;  . FLEXIBLE BRONCHOSCOPY Bilateral 01/20/2019   Procedure: FLEXIBLE BRONCHOSCOPY;  Surgeon: Ottie Glazier, MD;  Location: [facility];  Service: Thoracic;  Laterality: Bilateral;  . FOOT SURGERY Left    Screws and plates  . JOINT REPLACEMENT Left 12/2017   DR  Rudene Christians Hip  . KNEE SURGERY Left    X 2  . LEG SURGERY    . POLYPECTOMY N/A 02/19/2016   Procedure: POLYPECTOMY;  Surgeon: Lucilla Lame, MD;  Location: [place];  Service: Endoscopy;  Laterality: N/A;  . POLYPECTOMY  01/19/2018   Procedure: POLYPECTOMY;  Surgeon: Lucilla Lame, MD;  Location: [place];  Service: Endoscopy;;  . THORACOTOMY Left 01/14/2019   Procedure: THORACOTOMY MAJOR, LEFT;  Surgeon: Nestor Lewandowsky, MD;  Location: [facility];  Service: General;  Laterality: Left;  . TOTAL HIP ARTHROPLASTY Left 12/02/2017   Procedure: TOTAL HIP ARTHROPLASTY ANTERIOR APPROACH;  Surgeon: Hessie Knows, MD;  Location: [facility];  Service: Orthopedics;  Laterality: Left;  [name] VIDEO BRONCHOSCOPY Left 01/14/2019   Procedure: VIDEO BRONCHOSCOPY WITH FLUORO, LEFT;  Surgeon: Nestor Lewandowsky, MD;  Location: [facility];  Service: General;  Laterality: Left;     FAMILY HISTORY   Family History  Problem Relation Age of Onset  . Cancer Father   . Diabetes Sister   . Thrombosis Sister      SOCIAL HISTORY   Social History   Tobacco Use  . Smoking status: Former Smoker    Packs/day: 0.00    Years: 35.00    Pack years: 0.00  . Smokeless tobacco: Never Used  Substance Use Topics  . Alcohol use: No    Alcohol/week: 0.0 standard drinks  . Drug use: Yes    Types: Oxycodone    Comment: prescribed     MEDICATIONS   Current Medication:  Current Facility-Administered Medications:  .  acetylcysteine (MUCOMYST) 20 % nebulizer / oral solution 4 mL, 4 mL, Nebulization, BID, Kasa, Kurian, MD .  amiodarone (NEXTERONE PREMIX) 360-4.14 MG/200ML-% (1.8 mg/mL) IV infusion, 30 mg/hr, Intravenous, Continuous, Fath, Javier Docker, MD, Last Rate: 16.67 mL/hr at 01/21/19 1531, 30 mg/hr at 01/21/19 1531 .  atorvastatin (LIPITOR) tablet 10 mg, 10 mg, Oral, Daily, Nestor Lewandowsky, MD, 10 mg at 01/19/19 0901 .  bisacodyl (DULCOLAX) EC tablet 10 mg, 10 mg, Oral, Daily, Nestor Lewandowsky, MD, 10 mg at 01/19/19  0902 .  Chlorhexidine Gluconate Cloth 2 % PADS 6 each, 6 each, Topical, Daily, Oaks, [place], MD .  diltiazem (CARDIZEM) tablet 60 mg, 60 mg, Oral, Q6H, Paraschos, Alexander, MD, 60 mg at 01/21/19 1134 .  enoxaparin (LOVENOX) injection 40 mg, 40 mg, Subcutaneous, Q24H, Oaks, Christia Reading, MD, 40 mg at 01/19/19 0900 .  guaiFENesin (MUCINEX) 12 hr tablet 600 mg, 600 mg, Oral, BID, Oaks, Christia Reading, MD, 600 mg at 01/20/19 2115 .  hydrALAZINE (APRESOLINE) injection 10 mg, 10 mg, Intravenous, Q6H PRN, Tukov-Yual, Magdalene S, NP, 10 mg at 01/21/19 0032 .  hydrALAZINE (APRESOLINE) tablet 100 mg, 100 mg, Oral, BID, Nestor Lewandowsky, MD, 100 mg at 01/21/19 1134 .  insulin aspart (novoLOG) injection 0-5 Units, 0-5 Units, Subcutaneous, QHS, [place] Ree, Zachary R, PA-C .  insulin aspart (novoLOG) injection 0-9 Units, 0-9 Units, Subcutaneous, TID WC, Tylene Fantasia, PA-C, 1 Units at 01/21/19 1156 .  ipratropium-albuterol (DUONEB) 0.5-2.5 (3) MG/3ML nebulizer solution 3 mL, 3 mL, Nebulization, Q6H, Oaks, Timothy, MD, 3 mL at 01/21/19 1418 .  MEDLINE mouth rinse, 15 mL, Mouth Rinse, BID, Tukov-Yual, Magdalene S, NP, 15 mL at 01/20/19 2120 .  metFORMIN (GLUCOPHAGE-XR) 24 hr tablet 1,000 mg, 1,000 mg, Oral, BID WC, Nestor Lewandowsky, MD, 1,000 mg at 01/21/19 0738 .  methocarbamol (ROBAXIN) tablet 1,000 mg, 1,000 mg, Oral, QID, Nestor Lewandowsky, MD, 1,000 mg at 01/21/19 1456 .  metoprolol tartrate (LOPRESSOR) tablet 25 mg, 25 mg, Oral, Q6H, Paraschos, Alexander, MD, 25 mg at 01/21/19 1134 .  montelukast (SINGULAIR) tablet 10 mg, 10 mg, Oral, QHS, Oaks, Christia Reading, MD, 10 mg at 01/20/19 2114 .  nicotine (NICODERM CQ - dosed in mg/24 hours) patch 21 mg, 21 mg, Transdermal, Daily, Oaks, [place], MD .  nicotine polacrilex (NICORETTE) gum 2 mg, 2 mg, Oral, PRN, Nestor Lewandowsky, MD .  nortriptyline (PAMELOR) capsule 50 mg, 50 mg, Oral, QHS, Oaks, Christia Reading, MD, 50 mg at 01/19/19 2127 .  ondansetron (ZOFRAN) injection 4 mg, 4 mg, Intravenous, Q6H  PRN, Nestor Lewandowsky, MD, 4 mg at 01/20/19 1606 .  oxyCODONE (Oxy IR/ROXICODONE) immediate release tablet 10 mg, 10 mg, Oral, QID, Awilda Bill, NP, 10 mg at 01/21/19 1456 .  pantoprazole (PROTONIX) EC tablet 40 mg, 40 mg, Oral, Daily, Nestor Lewandowsky, MD, 40 mg at 01/20/19 1015    ALLERGIES   Aspirin, Epinephrine, Lidocaine, Novocain [procaine], Penicillins, Strawberry extract, and Shellfish allergy    REVIEW OF SYSTEMS     10 point ROS conducted and is as per subjective findings, specifically no chest pain no abdominal pain  PHYSICAL EXAMINATION   Vital Signs: Temp:  [97.6 F (36.4 C)-98.3 F (36.8 C)] 97.9 F (36.6 C) (11/19 1459) Pulse Rate:  [46-125] 78 (11/19 1600) Resp:  [11-24] 15 (11/19 1600) BP: (125-165)/(83-107) 143/101 (11/19 1600) SpO2:  [91 %-99 %] 93 % (11/19 1600)  GENERAL:NAD HEAD: Normocephalic, atraumatic.  EYES: Pupils equal, round, reactive to light.  No scleral icterus.  MOUTH: Moist mucosal membrane. NECK: Supple. No  thyromegaly. No nodules. No JVD.  PULMONARY: CTA on right, left chest with chest tube CARDIOVASCULAR: S1 and S2. Regular rate and rhythm. No murmurs, rubs, or gallops.  GASTROINTESTINAL: Soft, nontender, non-distended. No masses. Positive bowel sounds. No hepatosplenomegaly.  MUSCULOSKELETAL: No swelling, clubbing, or edema.  NEUROLOGIC: Mild distress due to acute illness SKIN:intact,warm,dry   PERTINENT DATA     Infusions: . amiodarone 30 mg/hr (01/21/19 1531)   Scheduled Medications: . acetylcysteine  4 mL Nebulization BID  . atorvastatin  10 mg Oral Daily  . bisacodyl  10 mg Oral Daily  . Chlorhexidine Gluconate Cloth  6 each Topical Daily  . diltiazem  60 mg Oral Q6H  . enoxaparin (LOVENOX) injection  40 mg Subcutaneous Q24H  . guaiFENesin  600 mg Oral BID  . hydrALAZINE  100 mg Oral BID  . insulin aspart  0-5 Units Subcutaneous QHS  . insulin aspart  0-9 Units Subcutaneous TID WC  . ipratropium-albuterol  3 mL  Nebulization Q6H  . mouth rinse  15 mL Mouth Rinse BID  . metFORMIN  1,000 mg Oral BID WC  . methocarbamol  1,000 mg Oral QID  . metoprolol tartrate  25 mg Oral Q6H  . montelukast  10 mg Oral QHS  . nicotine  21 mg Transdermal Daily  . nortriptyline  50 mg Oral QHS  . oxyCODONE  10 mg Oral QID  . pantoprazole  40 mg Oral Daily   PRN Medications: hydrALAZINE, nicotine polacrilex, ondansetron (ZOFRAN) IV Hemodynamic parameters:   Intake/Output: 11/18 0701 - 11/19 0700 In: 240 [P.O.:240] Out: -   Ventilator  Settings:       LAB RESULTS:  Basic Metabolic Panel: Recent Labs  Lab 01/15/19 0306 01/16/19 0424 01/16/19 0556 01/17/19 0701 01/19/19 0431 01/20/19 0432 01/20/19 1608 01/21/19 0506  NA 137 138  --  135 134* 137 138  --   K 4.3 4.2  --  3.7 3.4* 3.5 4.0  --   CL 102 100  --  99 100 102 99  --   CO2 26 29  --  _0 --   GLUCOSE 193* 156*  --  176* 132* 138* 159*  --   BUN 14 15  --  25* 23* 19 18  --   CREATININE 0.97 0.94  --  0.87 0.97 0.85 0.99  --   CALCIUM 8.3* 8.9  --  8.6* 8.3* 8.8* 9.0  --   MG 1.9  --  2.1 2.5*  --  1.9  --  2.0  PHOS  --   --  3.2 3.1  --   --  3.2  --    Liver Function Tests: Recent Labs  Lab 01/16/19 0556 01/17/19 0701 01/20/19 1608  AST 54* 31  --   ALT 59* 43  --   ALKPHOS 103 92  --   BILITOT 1.2 1.1  --   PROT 7.9 7.2  --   ALBUMIN 4.3 3.7 3.5   No results for input(s): LIPASE, AMYLASE in the last 168 hours. No results for input(s): AMMONIA in the last 168 hours. CBC: Recent Labs  Lab 01/14/19 1847 01/15/19 0306 01/16/19 0424 01/17/19 0701 01/18/19 0439  WBC 17.4* 13.7* 17.6* 15.1* 13.0*  NEUTROABS  --  11.6* 12.3*  --   --   HGB 13.7 13.2 14.7 13.6 13.5  HCT 41.4 39.7 46.4 43.2 40.9  MCV 88.3 88.8 91.0 92.5 89.1  PLT 218 203 252 251 257   Cardiac  Enzymes: No results for input(s): CKTOTAL, CKMB, CKMBINDEX, TROPONINI in the last 168 hours. BNP: Invalid input(s): POCBNP CBG: Recent Labs  Lab  01/20/19 1527 01/20/19 1537 01/20/19 2127 01/21/19 0742 01/21/19 1136  GLUCAP 114* 134* 151* 155* 139*     IMAGING RESULTS:  Imaging: Dg Chest Port 1 View  Result Date: 01/21/2019 CLINICAL DATA:  Recent left-sided thoracotomy and video bronchoscopy EXAM: PORTABLE CHEST 1 VIEW COMPARISON:  January 19, 2019 FINDINGS: Chest tubes on the left are unchanged in position. Minimal pneumothorax on the left laterally is again noted without tension component. Consolidation in the medial right base is stable. No new opacity evident. No appreciable pleural effusion. Heart size and pulmonary vascularity are normal. No adenopathy. No bone lesions. IMPRESSION: Stable minimal pneumothorax on the left laterally without tension component. No change in chest tube positions. Consolidation medial right base is stable. No new opacity. Stable cardiac silhouette. Electronically Signed   By: Lowella Grip III M.D.   On: 01/21/2019 08:26      EXAM: ABDOMEN - 1 VIEW  COMPARISON:  01/16/2019  FINDINGS: Moderate distension of the stomach is again noted. There is persistent gaseous distension of the colon. Decreased stool burden noted on today's exam.  IMPRESSION: Persistent gaseous distension of the large bowel loops with interval decrease in overall stool burden.   Electronically Signed   By: Kerby Moors M.D.   On: 01/17/2019 06:05     CLINICAL DATA:  Short of breath. Follow-up chest tube.  EXAM: CHEST  1 VIEW  COMPARISON:  01/15/2019  FINDINGS: Left-sided chest tube is in place. No significant pneumothorax visualized. Decreased lung volumes. Pulmonary vascular congestion. Atelectasis noted in the left base.  IMPRESSION: 1. No significant change in aeration to the left lung. 2. No significant pneumothorax noted. 3. Pulmonary vascular congestion.   Electronically Signed   By: Kerby Moors M.D.   On: 01/16/2019 05:42   CLINICAL DATA:  Postoperative check,  left-sided chest tube in place.  EXAM: CHEST - 2 VIEW  COMPARISON:  01/18/2019  FINDINGS: Two left-sided chest tubes remain in place. Tiny left pneumothorax is noted along the periphery of the left chest. Chest tube position similar to prior study.  Lobar airspace disease/collapse on the right as before. Probable small left effusion.  Dilated bowel loops in the upper abdomen as before. Cardiomediastinal contours are stable.  IMPRESSION: 1. Tiny left pneumothorax along the periphery of the left chest. Two left-sided chest tubes remain in place. 2. Redemonstration of volume loss and or consolidation in the right lung base and dilated bowel loops in the upper abdomen.   Electronically Signed   By: Zetta Bills M.D.   On: 01/19/2019 10:02      ASSESSMENT AND PLAN      Lung mass status post CT-guided biopsy favoring squamous cell lung CA   -Post thoracotomy with left chest tube   -Surgery on case appreciate input   -Chest tube evaluation-good tidal wave, no air leak on forced expiratory maneuver, minimal output approximately  -discussed plan for possible bronchoscopy as patient developed atelectasis wit RLL collapse.  -reviewed CXR today  -Lasix challenge today - please document I&O -optimizing AF to perform bronchoscopy  Ottie Glazier, M.D.  Division of [place]

## 2019-01-22 ENCOUNTER — Encounter
Admission: RE | Disposition: A | Discharge status: Home Health | Payer: Self-pay | Source: Home / Self Care | Attending: Cardiothoracic Surgery

## 2019-01-22 ENCOUNTER — Inpatient Hospital Stay: Payer: Medicare Other

## 2019-01-22 ENCOUNTER — Inpatient Hospital Stay: Payer: Medicare Other | Admitting: Anesthesiology

## 2019-01-22 ENCOUNTER — Inpatient Hospital Stay: Payer: Medicare Other | Admitting: Oncology

## 2019-01-22 HISTORY — PX: FLEXIBLE BRONCHOSCOPY: SHX5094

## 2019-01-22 LAB — CBC
HCT: 41.5 % (ref 39.0–52.0)
Hemoglobin: 13.6 g/dL (ref 13.0–17.0)
MCH: 29.2 pg (ref 26.0–34.0)
MCHC: 32.8 g/dL (ref 30.0–36.0)
MCV: 89.2 fL (ref 80.0–100.0)
Platelets: 317 10*3/uL (ref 150–400)
RBC: 4.65 MIL/uL (ref 4.22–5.81)
RDW: 13.2 % (ref 11.5–15.5)
WBC: 11.6 10*3/uL — ABNORMAL HIGH (ref 4.0–10.5)
nRBC: 0 % (ref 0.0–0.2)

## 2019-01-22 LAB — BASIC METABOLIC PANEL
Anion gap: 11 (ref 5–15)
BUN: 19 mg/dL (ref 6–20)
CO2: 29 mmol/L (ref 22–32)
Calcium: 8.6 mg/dL — ABNORMAL LOW (ref 8.9–10.3)
Chloride: 97 mmol/L — ABNORMAL LOW (ref 98–111)
Creatinine, Ser: 1.03 mg/dL (ref 0.61–1.24)
GFR calc Af Amer: >60 mL/min (ref >60)
GFR calc non Af Amer: >60 mL/min (ref >60)
Glucose, Bld: 127 mg/dL — ABNORMAL HIGH (ref 70–99)
Potassium: 3.4 mmol/L — ABNORMAL LOW (ref 3.5–5.1)
Sodium: 137 mmol/L (ref 135–145)

## 2019-01-22 LAB — PROTIME-INR
INR: 1.2 (ref 0.8–1.2)
Prothrombin Time: 14.8 seconds (ref 11.4–15.2)

## 2019-01-22 LAB — APTT: aPTT: 28 seconds (ref 24–36)

## 2019-01-22 LAB — GLUCOSE, CAPILLARY
Glucose-Capillary: 131 mg/dL — ABNORMAL HIGH (ref 70–99)
Glucose-Capillary: 247 mg/dL — ABNORMAL HIGH (ref 70–99)
Glucose-Capillary: 102 mg/dL — ABNORMAL HIGH (ref 70–99)
Glucose-Capillary: 106 mg/dL — ABNORMAL HIGH (ref 70–99)

## 2019-01-22 LAB — MAGNESIUM: Magnesium: 2.2 mg/dL (ref 1.7–2.4)

## 2019-01-22 SURGERY — BRONCHOSCOPY, FLEXIBLE
Anesthesia: General | Laterality: Bilateral

## 2019-01-22 MED ORDER — FLEET ENEMA 7-19 GM/118ML RE ENEM
1.0000 | ENEMA | Freq: Once | RECTAL | Status: AC
  Administered 2019-01-22: 1 via RECTAL

## 2019-01-22 MED ORDER — MIDAZOLAM HCL 2 MG/2ML IJ SOLN
INTRAMUSCULAR | Status: AC
  Filled 2019-01-22: qty 2

## 2019-01-22 MED ORDER — CHLORHEXIDINE GLUCONATE CLOTH 2 % EX PADS
6.0000 | MEDICATED_PAD | Freq: Every day | CUTANEOUS | Status: DC
  Administered 2019-01-22 – 2019-01-24 (×2): 6 via TOPICAL

## 2019-01-22 MED ORDER — POTASSIUM CHLORIDE CRYS ER 20 MEQ PO TBCR
40.0000 meq | EXTENDED_RELEASE_TABLET | ORAL | Status: AC
  Administered 2019-01-22 (×2): 40 meq via ORAL
  Filled 2019-01-22 (×2): qty 2

## 2019-01-22 MED ORDER — SUCCINYLCHOLINE CHLORIDE 20 MG/ML IJ SOLN
INTRAMUSCULAR | Status: AC
  Filled 2019-01-22: qty 1

## 2019-01-22 MED ORDER — ROCURONIUM BROMIDE 50 MG/5ML IV SOLN
INTRAVENOUS | Status: AC
  Filled 2019-01-22: qty 1

## 2019-01-22 MED ORDER — SODIUM CHLORIDE 0.9 % IV SOLN
INTRAVENOUS | Status: DC | PRN
  Administered 2019-01-22: 07:00:00 via INTRAVENOUS

## 2019-01-22 MED ORDER — FENTANYL CITRATE (PF) 100 MCG/2ML IJ SOLN
INTRAMUSCULAR | Status: AC
  Filled 2019-01-22: qty 2

## 2019-01-22 MED ORDER — PROPOFOL 10 MG/ML IV BOLUS
INTRAVENOUS | Status: AC
  Filled 2019-01-22: qty 20

## 2019-01-22 MED ORDER — ONDANSETRON HCL 4 MG/2ML IJ SOLN
INTRAMUSCULAR | Status: AC
  Filled 2019-01-22: qty 2

## 2019-01-22 MED ORDER — DEXAMETHASONE SODIUM PHOSPHATE 10 MG/ML IJ SOLN
INTRAMUSCULAR | Status: AC
  Filled 2019-01-22: qty 1

## 2019-01-22 NOTE — Progress Notes (Signed)
Patient Name: Kristopher [name] Date of Encounter: 01/22/2019  Hospital Problem List     Active Problems:   Lung mass   Atrial fibrillation with RVR (HCC)   Shortness of breath   Acute respiratory failure with hypoxia [facility])    Patient Profile     Patient with history of a lung mass who underwent lobectomy.  Postoperative course complicated by atrial fibrillation with rapid ventricular response.  Patient received metoprolol and an IV amiodarone bolus and converted to sinus rhythm.  He has remained in sinus rhythm for approximately 72 hours.  He was taken back for consideration for a bronchoscopy today.  Preop developed recurrent A. fib with rapid ventricular response.  Was given a 10 mg bolus of IV metoprolol and transferred to the ICU.  Currently is in atrial fibrillation with rapid ventricular response.  Hemodynamically stable.  Given amiodarone bolus with improvement in his heart rate.  Developed recurrent afib with rvr prior to bronch 48hours ago. Treated with iv amiodarone along with cardizem and metoprolol. Now back in nsr  Subjective   Feels better this am. For bronch this am. In nsr on po amiodarone, metoprolol and cardizem.   Inpatient Medications    . acetylcysteine  4 mL Nebulization BID  . amiodarone  400 mg Oral BID  . atorvastatin  10 mg Oral Daily  . bisacodyl  10 mg Oral Daily  . diltiazem  60 mg Oral Q6H  . enoxaparin (LOVENOX) injection  40 mg Subcutaneous Q24H  . guaiFENesin  600 mg Oral BID  . hydrALAZINE  100 mg Oral BID  . insulin aspart  0-5 Units Subcutaneous QHS  . insulin aspart  0-9 Units Subcutaneous TID WC  . ipratropium-albuterol  3 mL Nebulization Q6H  . mouth rinse  15 mL Mouth Rinse BID  . metFORMIN  1,000 mg Oral BID WC  . methocarbamol  1,000 mg Oral QID  . metoprolol tartrate  25 mg Oral Q6H  . montelukast  10 mg Oral QHS  . nortriptyline  50 mg Oral QHS  . oxyCODONE  10 mg Oral QID  . pantoprazole  40 mg Oral Daily    Vital Signs     Vitals:   01/22/19 0100 01/22/19 0400 01/22/19 0600 01/22/19 0615  BP: 119/85 119/86 (!) 145/97 (!) 145/97  Pulse: 73 78 83 90  Resp: 12 12 15    Temp:  98 F (36.7 C)    TempSrc:  Oral    SpO2: 94% 92% 95%   Weight:      Height:        Intake/Output Summary (Last 24 hours) at 01/22/2019 0723 Last data filed at 01/21/2019 2200 Gross per 24 hour  Intake 1287.45 ml  Output 1050 ml  Net 237.45 ml   Filed Weights   01/14/19 1112 01/20/19 1313  Weight: 106.4 kg 106.4 kg    Physical Exam    GEN: Well nourished, well developed, in no acute distress.  HEENT: normal.  Neck: Supple, no JVD, carotid bruits, or masses. Cardiac: RRR, no murmurs, rubs, or gallops. No clubbing, cyanosis, edema.  Radials/DP/PT 2+ and equal bilaterally.  Respiratory:  Rhonchi bilaterally GI: Soft, nontender, nondistended, BS + x 4. MS: no deformity or atrophy. Skin: warm and dry, no rash. Neuro:  Strength and sensation are intact. Psych: Normal affect.  Labs    CBC No results for input(s): WBC, NEUTROABS, HGB, HCT, MCV, PLT in the last 72 hours. Basic Metabolic Panel Recent Labs  01/20/19 1608 01/21/19 0506 01/22/19 0407  NA 138  --  137  K 4.0  --  3.4*  CL 99  --  97*  CO2 26  --  29  GLUCOSE 159*  --  127*  BUN 18  --  19  CREATININE 0.99  --  1.03  CALCIUM 9.0  --  8.6*  MG  --  2.0 2.2  PHOS 3.2  --   --    Liver Function Tests Recent Labs    01/20/19 1608  ALBUMIN 3.5   No results for input(s): LIPASE, AMYLASE in the last 72 hours. Cardiac Enzymes No results for input(s): CKTOTAL, CKMB, CKMBINDEX, TROPONINI in the last 72 hours. BNP No results for input(s): BNP in the last 72 hours. D-Dimer No results for input(s): DDIMER in the last 72 hours. Hemoglobin A1C No results for input(s): HGBA1C in the last 72 hours. Fasting Lipid Panel No results for input(s): CHOL, HDL, LDLCALC, TRIG, CHOLHDL, LDLDIRECT in the last 72 hours. Thyroid Function Tests No results for  input(s): TSH, T4TOTAL, T3FREE, THYROIDAB in the last 72 hours.  Invalid input(s): FREET3  Telemetry    nsr  ECG    nsr  Radiology    Dg Chest 1 View  Result Date: 01/16/2019 CLINICAL DATA:  Short of breath. Follow-up chest tube. EXAM: CHEST  1 VIEW COMPARISON:  01/15/2019 FINDINGS: Left-sided chest tube is in place. No significant pneumothorax visualized. Decreased lung volumes. Pulmonary vascular congestion. Atelectasis noted in the left base. IMPRESSION: 1. No significant change in aeration to the left lung. 2. No significant pneumothorax noted. 3. Pulmonary vascular congestion. Electronically Signed   By: Kerby Moors M.D.   On: 01/16/2019 05:42   Dg Chest 2 View  Result Date: 01/19/2019 CLINICAL DATA:  Postoperative check, left-sided chest tube in place. EXAM: CHEST - 2 VIEW COMPARISON:  01/18/2019 FINDINGS: Two left-sided chest tubes remain in place. Tiny left pneumothorax is noted along the periphery of the left chest. Chest tube position similar to prior study. Lobar airspace disease/collapse on the right as before. Probable small left effusion. Dilated bowel loops in the upper abdomen as before. Cardiomediastinal contours are stable. IMPRESSION: 1. Tiny left pneumothorax along the periphery of the left chest. Two left-sided chest tubes remain in place. 2. Redemonstration of volume loss and or consolidation in the right lung base and dilated bowel loops in the upper abdomen. Electronically Signed   By: Zetta Bills M.D.   On: 01/19/2019 10:02   Dg Chest 2 View  Result Date: 01/15/2019 CLINICAL DATA:  Postoperative EXAM: CHEST - 2 VIEW COMPARISON:  01/14/2019 FINDINGS: No significant change in examination status post left thoracotomy with left-sided chest tubes in position and no significant pneumothorax. Very low volume examination with mild diffuse interstitial opacity, likely bronchovascular crowding. No new airspace opacity. Mild cardiomegaly. Subcutaneous emphysema about  the left chest wall. IMPRESSION: 1. No significant change in examination status post left thoracotomy with left-sided chest tubes in position and no significant pneumothorax. 2. Very low volume examination with mild diffuse interstitial opacity, likely bronchovascular crowding. No new airspace opacity. Electronically Signed   By: Eddie Candle M.D.   On: 01/15/2019 09:19   Dg Abd 1 View  Result Date: 01/17/2019 CLINICAL DATA:  Abdominal distention. EXAM: ABDOMEN - 1 VIEW COMPARISON:  01/16/2019 FINDINGS: Moderate distension of the stomach is again noted. There is persistent gaseous distension of the colon. Decreased stool burden noted on today's exam. IMPRESSION: Persistent gaseous distension of the  large bowel loops with interval decrease in overall stool burden. Electronically Signed   By: Kerby Moors M.D.   On: 01/17/2019 06:05   Dg Chest Port 1 View  Result Date: 01/21/2019 CLINICAL DATA:  Recent left-sided thoracotomy and video bronchoscopy EXAM: PORTABLE CHEST 1 VIEW COMPARISON:  January 19, 2019 FINDINGS: Chest tubes on the left are unchanged in position. Minimal pneumothorax on the left laterally is again noted without tension component. Consolidation in the medial right base is stable. No new opacity evident. No appreciable pleural effusion. Heart size and pulmonary vascularity are normal. No adenopathy. No bone lesions. IMPRESSION: Stable minimal pneumothorax on the left laterally without tension component. No change in chest tube positions. Consolidation medial right base is stable. No new opacity. Stable cardiac silhouette. Electronically Signed   By: Lowella Grip III M.D.   On: 01/21/2019 08:26   Dg Chest Port 1 View  Result Date: 01/18/2019 CLINICAL DATA:  Chest tube in place EXAM: PORTABLE CHEST 1 VIEW COMPARISON:  Two days ago FINDINGS: Low volume chest with interval lobar collapse at the right base. Stable heart size accentuated by low volumes. No edema, effusion, or  pneumothorax. Large bore chest tube at the left base. IMPRESSION: Interval lobar collapse at the right base.  Lung volumes are low. No visible pneumothorax. Electronically Signed   By: Monte Fantasia M.D.   On: 01/18/2019 06:53   Dg Chest Port 1 View  Result Date: 01/14/2019 CLINICAL DATA:  Postop thoracotomy, left. EXAM: PORTABLE CHEST 1 VIEW COMPARISON:  Preoperative radiograph 12/16/2018, CT 11/17/2018 FINDINGS: Two left-sided chest tubes in place, 1 directed towards the apex, 1 towards the base. No definite visualized pneumothorax. Subcutaneous emphysema about the left lateral chest wall with adjacent skin staples. Lung volumes are low. Upper normal heart size, accentuated by low lung volumes. Bronchovascular crowding without pulmonary edema. No focal abnormality in the right lung. IMPRESSION: 1. Two left-sided chest tubes in place. No definite pneumothorax. Subcutaneous emphysema about the left lateral chest wall. 2. Low lung volumes with bronchovascular crowding. Electronically Signed   By: Keith Rake M.D.   On: 01/14/2019 17:59   Dg Abd Portable 2v  Result Date: 01/16/2019 CLINICAL DATA:  Short of breath. Chest tube. EXAM: PORTABLE ABDOMEN - 2 VIEW COMPARISON:  10/30/2017 FINDINGS: Moderate distension of the stomach. There is diffuse gaseous distension of the colon with a moderate stool burden identified within the right colon and descending colon. No significant small bowel dilatation identified. Previous left hip arthroplasty and anterior disc fusion of the lower lumbar spine. IMPRESSION: 1. Moderate gaseous distension of the, nonspecific. This may reflect colonic ileus versus distal bowel obstruction. Clinical correlation advised. 2. Moderate stool burden within the right colon and descending colon. Electronically Signed   By: Kerby Moors M.D.   On: 01/16/2019 05:44    Assessment & Plan    #1.  Atrial fibrillation-A. fib post thoracotomy.  Recurrent afib pre bronch two days ago.  Given iv amiodarone bolus and placed on drip along with cardizem and metoprolol. Converted back to nsr. Have changed from iv to po amiodarone to complete load. Will follow. Defer chronic po anticoagulation for now due to ongoing procedures.   2.  Lung mass-being treated by Dr. Genevive Bi.  Signed, Javier Docker Kamarah Bilotta MD 01/22/2019, 7:23 AM  Pager: [phone]

## 2019-01-22 NOTE — Progress Notes (Signed)
Pharmacy Electrolyte Monitoring Consult:  Pharmacy consulted to assist in monitoring and replacing electrolytes in this 58 y.o. male admitted on 01/14/2019 for video bronchoscopy and left thoracotomy due to a postsurgical bout of atrial fibrillation, currently resolved. Patient with CHADSVASc of 2 (Diabetes and Hypertension).   11/20: He received 80 mg IV Lasix at 1800 yesterday. No MIVF. Patient has been advanced to a regular diet.   Labs:  Sodium (mmol/L)  Date Value  01/22/2019 137  10/06/2018 142   Potassium (mmol/L)  Date Value  01/22/2019 3.4 (L)   Magnesium (mg/dL)  Date Value  01/22/2019 2.2   Phosphorus (mg/dL)  Date Value  01/20/2019 3.2   Calcium (mg/dL)  Date Value  01/22/2019 8.6 (L)   Albumin (g/dL)  Date Value  01/20/2019 3.5  10/06/2018 4.5   Corrected Ca: 9.0 mg/dL  Assessment/Plan: - Potassium chloride 40 mEq x 2 given. - No other electrolytes warranting replacement at this time. - Will check BMP with AM labs.  Goals of Therapy:  - K ~ 4  - Mg ~ 2 - All other electrolytes within normal limits.  Thank you for allowing pharmacy to be a part of this patient's care.  Raiford Simmonds, PharmD Candidate 01/22/2019 12:06 PM

## 2019-01-22 NOTE — Progress Notes (Signed)
Feels better.  Less shortness of breath and less pain.  Ate reasonably well yesterday.  Passing lots of gas  Remains in normal sinus rhythm  Thoracotomy wounds are clean and dry.  Some serous drainage from thoracoscopy port Lungs sound reasonably clear but decreased bilaterally Heart regular  No air leak  Will check chest xray to assess need for bronchoscopy Once bronch is done will remove chest tube  [employer].

## 2019-01-22 NOTE — Anesthesia Preprocedure Evaluation (Signed)
Anesthesia Evaluation  Patient identified by MRN, date of birth, ID band Patient awake    Reviewed: Allergy & Precautions, NPO status , Patient's Chart, lab work & pertinent test results  History of Anesthesia Complications Negative for: history of anesthetic complications  Airway Mallampati: II  TM Distance: >3 FB Neck ROM: Full    Dental  (+) Poor Dentition, Missing   Pulmonary former smoker,  7 days post op from thoracotomy    breath sounds clear to auscultation- rhonchi (-) wheezing      Cardiovascular hypertension, Pt. on medications (-) CAD, (-) Past MI, (-) Cardiac Stents and (-) CABG + dysrhythmias Atrial Fibrillation  Rhythm:Regular Rate:Normal - Systolic murmurs and - Diastolic murmurs    Neuro/Psych  Headaches, neg Seizures negative psych ROS   GI/Hepatic Neg liver ROS, GERD  ,  Endo/Other  diabetes, Oral Hypoglycemic Agents  Renal/GU negative Renal ROS     Musculoskeletal  (+) Arthritis ,   Abdominal (+) + obese,   Peds  Hematology negative hematology ROS (+)   Anesthesia Other Findings Past Medical History: No date: Allergy No date: Arthritis     Comment:  left foot No date: Benign hypertensive kidney disease No date: Chronic back pain     Comment:  Four rods in back No date: Diabetes mellitus, type 2 (HCC) No date: Dyspnea No date: GERD (gastroesophageal reflux disease) No date: Hypertension No date: Migraines     Comment:  daily   Reproductive/Obstetrics                             Anesthesia Physical Anesthesia Plan  ASA: III  Anesthesia Plan: General   Post-op Pain Management:    Induction: Intravenous  PONV Risk Score and Plan: 1 and Ondansetron and Dexamethasone  Airway Management Planned: Oral ETT  Additional Equipment:   Intra-op Plan:   Post-operative Plan: Extubation in OR  Informed Consent: I have reviewed the patients History and Physical,  chart, labs and discussed the procedure including the risks, benefits and alternatives for the proposed anesthesia with the patient or authorized representative who has indicated his/her understanding and acceptance.     Dental advisory given  Plan Discussed with: CRNA and Anesthesiologist  Anesthesia Plan Comments:         Anesthesia Quick Evaluation

## 2019-01-22 NOTE — Progress Notes (Signed)
CRITICAL CARE PROGRESS NOTE    Name: Kristopher [name] MRN: [id number] DOB: 12-Nov-1960     LOS: 8   SUBJECTIVE FINDINGS & SIGNIFICANT EVENTS   Patient clinically improved.  S/p CXR this am with improvement in right lower lobe collapse with residual atelectasis from hemidiaphragm elevation. Discussed with CT surgery Dr Genevive Bi, will hold bronchoscopy and continue with BPH regimen as it seems to be working for patient.   We diuresed him yesterday and he has put out >1L overnight with good clinical response.   PAST MEDICAL HISTORY   Past Medical History:  Diagnosis Date  . Allergy   . Arthritis    left foot  . Benign hypertensive kidney disease   . Chronic back pain    Four rods in back  . Diabetes mellitus, type 2 ([place])   . Dyspnea   . GERD (gastroesophageal reflux disease)   . Hypertension   . Migraines    daily     SURGICAL HISTORY   Past Surgical History:  Procedure Laterality Date  . APPENDECTOMY    . BACK SURGERY    . COLONOSCOPY WITH PROPOFOL N/A 02/19/2016   Procedure: COLONOSCOPY WITH PROPOFOL;  Surgeon: Lucilla Lame, MD;  Location: [place];  Service: Endoscopy;  Laterality: N/A;  . COLONOSCOPY WITH PROPOFOL N/A 01/19/2018   Procedure: COLONOSCOPY WITH PROPOFOL;  Surgeon: Lucilla Lame, MD;  Location: [place];  Service: Endoscopy;  Laterality: N/A;  Diabetic - oral meds  . DG OPERATIVE LEFT HIP ([place] [facility])     10/19  . ELECTROMAGNETIC NAVIGATION BROCHOSCOPY Left 11/18/2018   Procedure: ELECTROMAGNETIC NAVIGATION BRONCHOSCOPY;  Surgeon: Tyler Pita, MD;  Location: [facility];  Service: Cardiopulmonary;  Laterality: Left;  . FLEXIBLE BRONCHOSCOPY Bilateral 01/20/2019   Procedure: FLEXIBLE BRONCHOSCOPY;  Surgeon: Ottie Glazier, MD;  Location: [facility];  Service:  Thoracic;  Laterality: Bilateral;  . FOOT SURGERY Left    Screws and plates  . JOINT REPLACEMENT Left 12/2017   DR Rudene Christians Hip  . KNEE SURGERY Left    X 2  . LEG SURGERY    . POLYPECTOMY N/A 02/19/2016   Procedure: POLYPECTOMY;  Surgeon: Lucilla Lame, MD;  Location: [place];  Service: Endoscopy;  Laterality: N/A;  . POLYPECTOMY  01/19/2018   Procedure: POLYPECTOMY;  Surgeon: Lucilla Lame, MD;  Location: [place];  Service: Endoscopy;;  . THORACOTOMY Left 01/14/2019   Procedure: THORACOTOMY MAJOR, LEFT;  Surgeon: Nestor Lewandowsky, MD;  Location: [facility];  Service: General;  Laterality: Left;  . TOTAL HIP ARTHROPLASTY Left 12/02/2017   Procedure: TOTAL HIP ARTHROPLASTY ANTERIOR APPROACH;  Surgeon: Hessie Knows, MD;  Location: [facility];  Service: Orthopedics;  Laterality: Left;  [name] VIDEO BRONCHOSCOPY Left 01/14/2019   Procedure: VIDEO BRONCHOSCOPY WITH FLUORO, LEFT;  Surgeon: Nestor Lewandowsky, MD;  Location: [facility];  Service: General;  Laterality: Left;     FAMILY HISTORY   Family History  Problem Relation Age of Onset  . Cancer Father   . Diabetes Sister   . Thrombosis Sister      SOCIAL HISTORY   Social History   Tobacco Use  . Smoking status: Former Smoker    Packs/day: 0.00    Years: 35.00    Pack years: 0.00  . Smokeless tobacco: Never Used  Substance Use Topics  . Alcohol use: No    Alcohol/week: 0.0 standard drinks  . Drug use: Yes    Types: Oxycodone    Comment: prescribed  MEDICATIONS   Current Medication:  Current Facility-Administered Medications:  .  acetylcysteine (MUCOMYST) 20 % nebulizer / oral solution 4 mL, 4 mL, Nebulization, BID, Kasa, Kurian, MD .  amiodarone (PACERONE) tablet 400 mg, 400 mg, Oral, BID, Teodoro Spray, MD, 400 mg at 01/21/19 2233 .  atorvastatin (LIPITOR) tablet 10 mg, 10 mg, Oral, Daily, Nestor Lewandowsky, MD, 10 mg at 01/19/19 0901 .  bisacodyl (DULCOLAX) EC tablet 10 mg, 10 mg, Oral, Daily, Nestor Lewandowsky,  MD, 10 mg at 01/19/19 0902 .  Chlorhexidine Gluconate Cloth 2 % PADS 6 each, 6 each, Topical, Daily, Oaks, [place], MD .  diltiazem (CARDIZEM) tablet 60 mg, 60 mg, Oral, Q6H, Paraschos, Alexander, MD, 60 mg at 01/22/19 0615 .  enoxaparin (LOVENOX) injection 40 mg, 40 mg, Subcutaneous, Q24H, Oaks, Christia Reading, MD, 40 mg at 01/19/19 0900 .  guaiFENesin (MUCINEX) 12 hr tablet 600 mg, 600 mg, Oral, BID, Oaks, Timothy, MD, 600 mg at 01/20/19 2115 .  hydrALAZINE (APRESOLINE) tablet 100 mg, 100 mg, Oral, BID, Nestor Lewandowsky, MD, 100 mg at 01/21/19 2232 .  insulin aspart (novoLOG) injection 0-5 Units, 0-5 Units, Subcutaneous, QHS, [place] Ree, Zachary R, PA-C .  insulin aspart (novoLOG) injection 0-9 Units, 0-9 Units, Subcutaneous, TID WC, Tylene Fantasia, PA-C, 1 Units at 01/22/19 [phone] .  ipratropium-albuterol (DUONEB) 0.5-2.5 (3) MG/3ML nebulizer solution 3 mL, 3 mL, Nebulization, Q6H, Oaks, Christia Reading, MD, 3 mL at 01/21/19 2038 .  MEDLINE mouth rinse, 15 mL, Mouth Rinse, BID, Tukov-Yual, Magdalene S, NP, 15 mL at 01/21/19 2235 .  metFORMIN (GLUCOPHAGE-XR) 24 hr tablet 1,000 mg, 1,000 mg, Oral, BID WC, Nestor Lewandowsky, MD, 1,000 mg at 01/21/19 1739 .  methocarbamol (ROBAXIN) tablet 1,000 mg, 1,000 mg, Oral, QID, Nestor Lewandowsky, MD, 1,000 mg at 01/21/19 1738 .  metoprolol tartrate (LOPRESSOR) tablet 25 mg, 25 mg, Oral, Q6H, Paraschos, Alexander, MD, 25 mg at 01/22/19 0615 .  montelukast (SINGULAIR) tablet 10 mg, 10 mg, Oral, QHS, Oaks, Christia Reading, MD, 10 mg at 01/20/19 2114 .  nortriptyline (PAMELOR) capsule 50 mg, 50 mg, Oral, QHS, Oaks, Christia Reading, MD, 50 mg at 01/19/19 2127 .  oxyCODONE (Oxy IR/ROXICODONE) immediate release tablet 10 mg, 10 mg, Oral, QID, Awilda Bill, NP, 10 mg at 01/21/19 2224 .  pantoprazole (PROTONIX) EC tablet 40 mg, 40 mg, Oral, Daily, Oaks, Francis, MD, 40 mg at 01/20/19 1015  Facility-Administered Medications Ordered in Other Encounters:  .  0.9 %  sodium chloride infusion, , , Continuous  PRN, Dionne Bucy, CRNA    ALLERGIES   Aspirin, Epinephrine, Lidocaine, Novocain [procaine], Penicillins, Strawberry extract, and Shellfish allergy    REVIEW OF SYSTEMS     10 point ROS conducted and is as per subjective findings, specifically no chest pain no abdominal pain  PHYSICAL EXAMINATION   Vital Signs: Temp:  [97.5 F (36.4 C)-98.1 F (36.7 C)] 98.1 F (36.7 C) (11/20 0732) Pulse Rate:  [65-90] 71 (11/20 0732) Resp:  [11-22] 17 (11/20 0732) BP: (107-178)/(74-121) 133/85 (11/20 0732) SpO2:  [91 %-97 %] 91 % (11/20 0732) FiO2 (%):  [1.5 %] 1.5 % (11/20 0400)  GENERAL:NAD HEAD: Normocephalic, atraumatic.  EYES: Pupils equal, round, reactive to light.  No scleral icterus.  MOUTH: Moist mucosal membrane. NECK: Supple. No thyromegaly. No nodules. No JVD.  PULMONARY: CTA on right, left chest with chest tube CARDIOVASCULAR: S1 and S2. Regular rate and rhythm. No murmurs, rubs, or gallops.  GASTROINTESTINAL: Soft, nontender, non-distended. No masses. Positive bowel sounds. No hepatosplenomegaly.  MUSCULOSKELETAL: No  swelling, clubbing, or edema.  NEUROLOGIC: Mild distress due to acute illness SKIN:intact,warm,dry   PERTINENT DATA     Infusions:  Scheduled Medications: . acetylcysteine  4 mL Nebulization BID  . amiodarone  400 mg Oral BID  . atorvastatin  10 mg Oral Daily  . bisacodyl  10 mg Oral Daily  . Chlorhexidine Gluconate Cloth  6 each Topical Daily  . diltiazem  60 mg Oral Q6H  . enoxaparin (LOVENOX) injection  40 mg Subcutaneous Q24H  . guaiFENesin  600 mg Oral BID  . hydrALAZINE  100 mg Oral BID  . insulin aspart  0-5 Units Subcutaneous QHS  . insulin aspart  0-9 Units Subcutaneous TID WC  . ipratropium-albuterol  3 mL Nebulization Q6H  . mouth rinse  15 mL Mouth Rinse BID  . metFORMIN  1,000 mg Oral BID WC  . methocarbamol  1,000 mg Oral QID  . metoprolol tartrate  25 mg Oral Q6H  . montelukast  10 mg Oral QHS  . nortriptyline  50 mg Oral  QHS  . oxyCODONE  10 mg Oral QID  . pantoprazole  40 mg Oral Daily   PRN Medications:  Hemodynamic parameters:   Intake/Output: 11/19 0701 - 11/20 0700 In: 1287.5 [P.O.:240; I.V.:1047.5] Out: 1050 [Urine:1050]  Ventilator  Settings: FiO2 (%):  [1.5 %] 1.5 %     LAB RESULTS:  Basic Metabolic Panel: Recent Labs  Lab 01/16/19 0556 01/17/19 0701 01/19/19 0431 01/20/19 0432 01/20/19 1608 01/21/19 0506 01/22/19 0407  NA  --  135 134* 137 138  --  137  K  --  3.7 3.4* 3.5 4.0  --  3.4*  CL  --  99 100 102 99  --  97*  CO2  --  _0 --  29  GLUCOSE  --  176* 132* 138* 159*  --  127*  BUN  --  25* 23* 19 18  --  19  CREATININE  --  0.87 0.97 0.85 0.99  --  1.03  CALCIUM  --  8.6* 8.3* 8.8* 9.0  --  8.6*  MG 2.1 2.5*  --  1.9  --  2.0 2.2  PHOS 3.2 3.1  --   --  3.2  --   --    Liver Function Tests: Recent Labs  Lab 01/16/19 0556 01/17/19 0701 01/20/19 1608  AST 54* 31  --   ALT 59* 43  --   ALKPHOS 103 92  --   BILITOT 1.2 1.1  --   PROT 7.9 7.2  --   ALBUMIN 4.3 3.7 3.5   No results for input(s): LIPASE, AMYLASE in the last 168 hours. No results for input(s): AMMONIA in the last 168 hours. CBC: Recent Labs  Lab 01/16/19 0424 01/17/19 0701 01/18/19 0439  WBC 17.6* 15.1* 13.0*  NEUTROABS 12.3*  --   --   HGB 14.7 13.6 13.5  HCT 46.4 43.2 40.9  MCV 91.0 92.5 89.1  PLT 252 251 257   Cardiac Enzymes: No results for input(s): CKTOTAL, CKMB, CKMBINDEX, TROPONINI in the last 168 hours. BNP: Invalid input(s): POCBNP CBG: Recent Labs  Lab 01/21/19 0742 01/21/19 1136 01/21/19 1733 01/21/19 1943 01/22/19 0727  GLUCAP 155* 139* 123* 141* 131*     IMAGING RESULTS:  Imaging: Dg Chest Port 1 View  Result Date: 01/22/2019 CLINICAL DATA:  LEFT chest tube EXAM: PORTABLE CHEST 1 VIEW COMPARISON:  Portable exam 0724 hours compared to 01/21/2019 FINDINGS: LEFT thoracostomy tube unchanged. Normal  heart size, mediastinal contours, and pulmonary  vascularity. Low lung volumes. Tiny LEFT pneumothorax. Minimal RIGHT basilar atelectasis. No infiltrate or pleural effusion. Osseous structures unremarkable. IMPRESSION: LEFT thoracostomy tube with tiny LEFT pneumothorax. Electronically Signed   By: Lavonia Dana M.D.   On: 01/22/2019 08:00   Dg Chest Port 1 View  Result Date: 01/21/2019 CLINICAL DATA:  Recent left-sided thoracotomy and video bronchoscopy EXAM: PORTABLE CHEST 1 VIEW COMPARISON:  January 19, 2019 FINDINGS: Chest tubes on the left are unchanged in position. Minimal pneumothorax on the left laterally is again noted without tension component. Consolidation in the medial right base is stable. No new opacity evident. No appreciable pleural effusion. Heart size and pulmonary vascularity are normal. No adenopathy. No bone lesions. IMPRESSION: Stable minimal pneumothorax on the left laterally without tension component. No change in chest tube positions. Consolidation medial right base is stable. No new opacity. Stable cardiac silhouette. Electronically Signed   By: Lowella Grip III M.D.   On: 01/21/2019 08:26      EXAM: ABDOMEN - 1 VIEW  COMPARISON:  01/16/2019  FINDINGS: Moderate distension of the stomach is again noted. There is persistent gaseous distension of the colon. Decreased stool burden noted on today's exam.  IMPRESSION: Persistent gaseous distension of the large bowel loops with interval decrease in overall stool burden.   Electronically Signed   By: Kerby Moors M.D.   On: 01/17/2019 06:05     CLINICAL DATA:  Short of breath. Follow-up chest tube.  EXAM: CHEST  1 VIEW  COMPARISON:  01/15/2019  FINDINGS: Left-sided chest tube is in place. No significant pneumothorax visualized. Decreased lung volumes. Pulmonary vascular congestion. Atelectasis noted in the left base.  IMPRESSION: 1. No significant change in aeration to the left lung. 2. No significant pneumothorax noted. 3. Pulmonary  vascular congestion.   Electronically Signed   By: Kerby Moors M.D.   On: 01/16/2019 05:42   CLINICAL DATA:  Postoperative check, left-sided chest tube in place.  EXAM: CHEST - 2 VIEW  COMPARISON:  01/18/2019  FINDINGS: Two left-sided chest tubes remain in place. Tiny left pneumothorax is noted along the periphery of the left chest. Chest tube position similar to prior study.  Lobar airspace disease/collapse on the right as before. Probable small left effusion.  Dilated bowel loops in the upper abdomen as before. Cardiomediastinal contours are stable.  IMPRESSION: 1. Tiny left pneumothorax along the periphery of the left chest. Two left-sided chest tubes remain in place. 2. Redemonstration of volume loss and or consolidation in the right lung base and dilated bowel loops in the upper abdomen.   Electronically Signed   By: Zetta Bills M.D.   On: 01/19/2019 10:02         ASSESSMENT AND PLAN      Lung mass status post CT-guided biopsy favoring squamous cell lung CA   -Post thoracotomy with left chest tube   -Surgery on case appreciate input-Discussed with Dr Genevive Bi today .    -Chest tube evaluation-good tidal wave, no air leak on forced expiratory maneuver, minimal output approximately  -discussed plan for possible bronchoscopy as patient developed atelectasis wit RLL collapse- minimal at this point will hold procedure.  -reviewed CXR today  -Lasix challenge today - please document I&O - negative 3.4 liters post diuresis yesterday.  -optimizing AF to perform bronchoscopy -CXR this am with mild RLL atelectasis possibly due to diapharagmatic eventration , tiny Left pneumorhorax     RLL atelectasis   - continue with  aggressive bronchopulmonary hygiene   - MEtaneb and mucomyst with neb therapy    Left tiny pneumonthorax    - continue supplemental O2 and BPH as above    Ottie Glazier, M.D.  Division of [place]

## 2019-01-23 LAB — GLUCOSE, CAPILLARY
Glucose-Capillary: 130 mg/dL — ABNORMAL HIGH (ref 70–99)
Glucose-Capillary: 201 mg/dL — ABNORMAL HIGH (ref 70–99)
Glucose-Capillary: 107 mg/dL — ABNORMAL HIGH (ref 70–99)
Glucose-Capillary: 143 mg/dL — ABNORMAL HIGH (ref 70–99)

## 2019-01-23 LAB — BASIC METABOLIC PANEL
Anion gap: 11 (ref 5–15)
BUN: 22 mg/dL — ABNORMAL HIGH (ref 6–20)
CO2: 28 mmol/L (ref 22–32)
Calcium: 8.6 mg/dL — ABNORMAL LOW (ref 8.9–10.3)
Chloride: 98 mmol/L (ref 98–111)
Creatinine, Ser: 0.95 mg/dL (ref 0.61–1.24)
GFR calc Af Amer: >60 mL/min (ref >60)
GFR calc non Af Amer: >60 mL/min (ref >60)
Glucose, Bld: 136 mg/dL — ABNORMAL HIGH (ref 70–99)
Potassium: 3.9 mmol/L (ref 3.5–5.1)
Sodium: 137 mmol/L (ref 135–145)

## 2019-01-23 LAB — PHOSPHORUS: Phosphorus: 3.5 mg/dL (ref 2.5–4.6)

## 2019-01-23 LAB — MAGNESIUM: Magnesium: 2.3 mg/dL (ref 1.7–2.4)

## 2019-01-23 MED ORDER — AMIODARONE HCL 200 MG PO TABS
200.0000 mg | ORAL_TABLET | Freq: Two times a day (BID) | ORAL | Status: DC
  Administered 2019-01-23 – 2019-01-25 (×4): 200 mg via ORAL
  Filled 2019-01-23 (×4): qty 1

## 2019-01-23 MED ORDER — POLYETHYLENE GLYCOL 3350 17 G PO PACK
17.0000 g | PACK | Freq: Every day | ORAL | Status: DC
  Filled 2019-01-23 (×3): qty 1

## 2019-01-23 MED ORDER — ATORVASTATIN CALCIUM 10 MG PO TABS
10.0000 mg | ORAL_TABLET | Freq: Every day | ORAL | Status: DC
  Administered 2019-01-24: 10 mg via ORAL
  Filled 2019-01-23: qty 1

## 2019-01-23 MED ORDER — SIMETHICONE 80 MG PO CHEW
80.0000 mg | CHEWABLE_TABLET | Freq: Four times a day (QID) | ORAL | Status: DC | PRN
  Administered 2019-01-23 – 2019-01-25 (×3): 80 mg via ORAL
  Filled 2019-01-23 (×4): qty 1

## 2019-01-23 MED ORDER — ONDANSETRON HCL 4 MG/2ML IJ SOLN: 4.0000 mg | Freq: Four times a day (QID) | INTRAMUSCULAR | Status: DC | PRN

## 2019-01-23 NOTE — Progress Notes (Signed)
[facility] Cardiology [facility] Encounter Note  Patient: Kristopher [name] / Admit Date: 01/14/2019 / Date of Encounter: 01/23/2019, 6:13 AM   Subjective: Patient feeling much better today with chest tubes being removed.  Patient's breathing is exceptionally better.  No evidence of congestive heart failure or anginal symptoms.  Patient has converted into normal sinus rhythm with intravenous and oral amiodarone.  Patient does appear to have some side effects of amiodarone with nausea and abdominal discomfort.  Review of Systems: Positive for: Nausea Negative for: Vision change, hearing change, syncope, dizziness,  vomiting,diarrhea, bloody stool, stomach pain, cough, congestion, diaphoresis, urinary frequency, urinary pain,skin lesions, skin rashes Others previously listed  Objective: Telemetry: Normal sinus rhythm Physical Exam: Blood pressure 134/90, pulse 80, temperature 98.4 F (36.9 C), temperature source Oral, resp. rate 18, height 5\' 10"  (1.778 m), weight 106.4 kg, SpO2 94 %. Body mass index is 33.66 kg/m. General: Well developed, well nourished, in no acute distress. Head: Normocephalic, atraumatic, sclera non-icteric, no xanthomas, nares are without discharge. Neck: No apparent masses Lungs: Normal respirations with no wheezes, few rhonchi, no rales , no crackles   Heart: Regular rate and rhythm, normal S1 S2, no murmur, no rub, no gallop, PMI is normal size and placement, carotid upstroke normal without bruit, jugular venous pressure normal Abdomen: Soft, non-tender, non-distended with normoactive bowel sounds. No hepatosplenomegaly. Abdominal aorta is normal size without bruit Extremities: No edema, no clubbing, no cyanosis, no ulcers,  Peripheral: 2+ radial, 2+ femoral, 2+ dorsal pedal pulses Neuro: Alert and oriented. Moves all extremities spontaneously. Psych:  Responds to questions appropriately with a normal affect.   Intake/Output Summary (Last 24 hours) at 01/23/2019  7893 Last data filed at 01/22/2019 1200 Gross per 24 hour  Intake 101.83 ml  Output 200 ml  Net -98.17 ml    Inpatient Medications:  . acetylcysteine  4 mL Nebulization BID  . amiodarone  400 mg Oral BID  . atorvastatin  10 mg Oral Daily  . bisacodyl  10 mg Oral Daily  . Chlorhexidine Gluconate Cloth  6 each Topical Daily  . diltiazem  60 mg Oral Q6H  . enoxaparin (LOVENOX) injection  40 mg Subcutaneous Q24H  . guaiFENesin  600 mg Oral BID  . hydrALAZINE  100 mg Oral BID  . insulin aspart  0-5 Units Subcutaneous QHS  . insulin aspart  0-9 Units Subcutaneous TID WC  . ipratropium-albuterol  3 mL Nebulization Q6H  . mouth rinse  15 mL Mouth Rinse BID  . metFORMIN  1,000 mg Oral BID WC  . methocarbamol  1,000 mg Oral QID  . metoprolol tartrate  25 mg Oral Q6H  . montelukast  10 mg Oral QHS  . nortriptyline  50 mg Oral QHS  . oxyCODONE  10 mg Oral QID  . pantoprazole  40 mg Oral Daily   Infusions:   Labs: Recent Labs    01/20/19 1608 01/21/19 0506 01/22/19 0407  NA 138  --  137  K 4.0  --  3.4*  CL 99  --  97*  CO2 26  --  29  GLUCOSE 159*  --  127*  BUN 18  --  19  CREATININE 0.99  --  1.03  CALCIUM 9.0  --  8.6*  MG  --  2.0 2.2  PHOS 3.2  --   --    Recent Labs    01/20/19 1608  ALBUMIN 3.5   Recent Labs    01/22/19 0746  WBC 11.6*  HGB 13.6  HCT 41.5  MCV 89.2  PLT 317   No results for input(s): CKTOTAL, CKMB, TROPONINI in the last 72 hours. Invalid input(s): POCBNP No results for input(s): HGBA1C in the last 72 hours.   Weights: Filed Weights   01/14/19 1112 01/20/19 1313  Weight: 106.4 kg 106.4 kg     Radiology/Studies:  Dg Chest 1 View  Result Date: 01/16/2019 CLINICAL DATA:  Short of breath. Follow-up chest tube. EXAM: CHEST  1 VIEW COMPARISON:  01/15/2019 FINDINGS: Left-sided chest tube is in place. No significant pneumothorax visualized. Decreased lung volumes. Pulmonary vascular congestion. Atelectasis noted in the left base.  IMPRESSION: 1. No significant change in aeration to the left lung. 2. No significant pneumothorax noted. 3. Pulmonary vascular congestion. Electronically Signed   By: Kerby Moors M.D.   On: 01/16/2019 05:42   Dg Chest 2 View  Result Date: 01/19/2019 CLINICAL DATA:  Postoperative check, left-sided chest tube in place. EXAM: CHEST - 2 VIEW COMPARISON:  01/18/2019 FINDINGS: Two left-sided chest tubes remain in place. Tiny left pneumothorax is noted along the periphery of the left chest. Chest tube position similar to prior study. Lobar airspace disease/collapse on the right as before. Probable small left effusion. Dilated bowel loops in the upper abdomen as before. Cardiomediastinal contours are stable. IMPRESSION: 1. Tiny left pneumothorax along the periphery of the left chest. Two left-sided chest tubes remain in place. 2. Redemonstration of volume loss and or consolidation in the right lung base and dilated bowel loops in the upper abdomen. Electronically Signed   By: Zetta Bills M.D.   On: 01/19/2019 10:02   Dg Chest 2 View  Result Date: 01/15/2019 CLINICAL DATA:  Postoperative EXAM: CHEST - 2 VIEW COMPARISON:  01/14/2019 FINDINGS: No significant change in examination status post left thoracotomy with left-sided chest tubes in position and no significant pneumothorax. Very low volume examination with mild diffuse interstitial opacity, likely bronchovascular crowding. No new airspace opacity. Mild cardiomegaly. Subcutaneous emphysema about the left chest wall. IMPRESSION: 1. No significant change in examination status post left thoracotomy with left-sided chest tubes in position and no significant pneumothorax. 2. Very low volume examination with mild diffuse interstitial opacity, likely bronchovascular crowding. No new airspace opacity. Electronically Signed   By: Eddie Candle M.D.   On: 01/15/2019 09:19   Dg Abd 1 View  Result Date: 01/17/2019 CLINICAL DATA:  Abdominal distention. EXAM:  ABDOMEN - 1 VIEW COMPARISON:  01/16/2019 FINDINGS: Moderate distension of the stomach is again noted. There is persistent gaseous distension of the colon. Decreased stool burden noted on today's exam. IMPRESSION: Persistent gaseous distension of the large bowel loops with interval decrease in overall stool burden. Electronically Signed   By: Kerby Moors M.D.   On: 01/17/2019 06:05   Dg Chest Port 1 View  Result Date: 01/22/2019 CLINICAL DATA:  LEFT chest tube EXAM: PORTABLE CHEST 1 VIEW COMPARISON:  Portable exam 0724 hours compared to 01/21/2019 FINDINGS: LEFT thoracostomy tube unchanged. Normal heart size, mediastinal contours, and pulmonary vascularity. Low lung volumes. Tiny LEFT pneumothorax. Minimal RIGHT basilar atelectasis. No infiltrate or pleural effusion. Osseous structures unremarkable. IMPRESSION: LEFT thoracostomy tube with tiny LEFT pneumothorax. Electronically Signed   By: Lavonia Dana M.D.   On: 01/22/2019 08:00   Dg Chest Port 1 View  Result Date: 01/21/2019 CLINICAL DATA:  Recent left-sided thoracotomy and video bronchoscopy EXAM: PORTABLE CHEST 1 VIEW COMPARISON:  January 19, 2019 FINDINGS: Chest tubes on the left are unchanged in position. Minimal  pneumothorax on the left laterally is again noted without tension component. Consolidation in the medial right base is stable. No new opacity evident. No appreciable pleural effusion. Heart size and pulmonary vascularity are normal. No adenopathy. No bone lesions. IMPRESSION: Stable minimal pneumothorax on the left laterally without tension component. No change in chest tube positions. Consolidation medial right base is stable. No new opacity. Stable cardiac silhouette. Electronically Signed   By: Lowella Grip III M.D.   On: 01/21/2019 08:26   Dg Chest Port 1 View  Result Date: 01/18/2019 CLINICAL DATA:  Chest tube in place EXAM: PORTABLE CHEST 1 VIEW COMPARISON:  Two days ago FINDINGS: Low volume chest with interval lobar  collapse at the right base. Stable heart size accentuated by low volumes. No edema, effusion, or pneumothorax. Large bore chest tube at the left base. IMPRESSION: Interval lobar collapse at the right base.  Lung volumes are low. No visible pneumothorax. Electronically Signed   By: Monte Fantasia M.D.   On: 01/18/2019 06:53   Dg Chest Port 1 View  Result Date: 01/14/2019 CLINICAL DATA:  Postop thoracotomy, left. EXAM: PORTABLE CHEST 1 VIEW COMPARISON:  Preoperative radiograph 12/16/2018, CT 11/17/2018 FINDINGS: Two left-sided chest tubes in place, 1 directed towards the apex, 1 towards the base. No definite visualized pneumothorax. Subcutaneous emphysema about the left lateral chest wall with adjacent skin staples. Lung volumes are low. Upper normal heart size, accentuated by low lung volumes. Bronchovascular crowding without pulmonary edema. No focal abnormality in the right lung. IMPRESSION: 1. Two left-sided chest tubes in place. No definite pneumothorax. Subcutaneous emphysema about the left lateral chest wall. 2. Low lung volumes with bronchovascular crowding. Electronically Signed   By: Keith Rake M.D.   On: 01/14/2019 17:59   Dg Abd Portable 2v  Result Date: 01/16/2019 CLINICAL DATA:  Short of breath. Chest tube. EXAM: PORTABLE ABDOMEN - 2 VIEW COMPARISON:  10/30/2017 FINDINGS: Moderate distension of the stomach. There is diffuse gaseous distension of the colon with a moderate stool burden identified within the right colon and descending colon. No significant small bowel dilatation identified. Previous left hip arthroplasty and anterior disc fusion of the lower lumbar spine. IMPRESSION: 1. Moderate gaseous distension of the, nonspecific. This may reflect colonic ileus versus distal bowel obstruction. Clinical correlation advised. 2. Moderate stool burden within the right colon and descending colon. Electronically Signed   By: Kerby Moors M.D.   On: 01/16/2019 05:44     Assessment and  Recommendation  58 y.o. male with atrial fibrillation with rapid ventricular rate status post thoracotomy multifactorial in nature now back in normal rhythm with appropriate medication management including amiodarone and metoprolol without evidence of congestive heart failure or myocardial infarction 1.  Continue amiodarone metoprolol combination for maintenance of normal sinus rhythm after episode of atrial fibrillation.  The patient will be best served by changing amiodarone to 200 mg twice per day upon discharge to home and continue metoprolol at 50 mg twice per day. 2.  No anticoagulation at this time due to concerns of bleeding complications status post thoracotomy and other bronc.  Will make decisions of further anticoagulation and with follow-up next week in the office 3.  Okay for discharge home from cardiac standpoint with listed recommendations above after ambulation  Signed, Serafina Royals M.D. FACC

## 2019-01-23 NOTE — Progress Notes (Signed)
Pharmacy Electrolyte Monitoring Consult:  Pharmacy consulted to assist in monitoring and replacing electrolytes in this 58 y.o. male admitted on 01/14/2019 for video bronchoscopy and left thoracotomy due to a postsurgical bout of atrial fibrillation, currently resolved. Patient with CHADSVASc of 2 (Diabetes and Hypertension).   No MIVF. Patient has been advanced to a regular diet.   Labs:  Sodium (mmol/L)  Date Value  01/23/2019 137  10/06/2018 142   Potassium (mmol/L)  Date Value  01/23/2019 3.9   Magnesium (mg/dL)  Date Value  01/23/2019 2.3   Phosphorus (mg/dL)  Date Value  01/23/2019 3.5   Calcium (mg/dL)  Date Value  01/23/2019 8.6 (L)   Albumin (g/dL)  Date Value  01/20/2019 3.5  10/06/2018 4.5   Corrected Ca: 9.0 mg/dL  Assessment/Plan: - No electrolytes warranting replacement at this time. - Will check BMP with AM labs.  Goals of Therapy:  - K ~ 4  - Mg ~ 2 - All other electrolytes within normal limits.  Thank you for allowing pharmacy to be a part of this patient's care.  Olivia Canter, [facility] 01/23/2019 2:25 PM

## 2019-01-23 NOTE — Progress Notes (Addendum)
Notify Dr. Dahlia Byes about patient's still complaining of abdominal pain and distention, last KUB was the 15th, asked if we can start him on Miralax for bowel regimen, order given.   Also notify Dr. Nehemiah Massed about patient's qtc being prolonged at 565 when telemetry called me, patient on a new amiodarone 400 mg p.o. awaiting for his response. Asked Dr. Dahlia Byes if we can do 12 lead EKG, ordered given.   Update: called Dr. Erin Fulling about the 12 lead EKG, qtc is 511, order to decrease amiodarone dose to 200 mg BID. RN will continue to monitor.

## 2019-01-23 NOTE — Progress Notes (Signed)
POD # 9 NSR Main issues is abd distension from ileus /narcotics CT out and breathing much better AVSS  PE NAD Lungs CTA, incision c/d/i, no infection. Abd: distended w decrease bs, no peritonitis  A/P Doing well Try avoid more narcotics Ambulate May xfer to floor when bed available

## 2019-01-23 NOTE — Progress Notes (Signed)
CRITICAL CARE PROGRESS NOTE    Name: Kristopher [name] MRN: [id number] DOB: 12/22/1960     LOS: 9   SUBJECTIVE FINDINGS & SIGNIFICANT EVENTS   Patient significantly improved, ambulating around hallway.  Feels that abdomen is less distended , s/p BMx2 this morning. He inquires about polypharmacy, states he would like to decrease amount of medication he takes.   Breath sounds improved, with some rhonchi on both sides still.    PAST MEDICAL HISTORY   Past Medical History:  Diagnosis Date  . Allergy   . Arthritis    left foot  . Benign hypertensive kidney disease   . Chronic back pain    Four rods in back  . Diabetes mellitus, type 2 ([place])   . Dyspnea   . GERD (gastroesophageal reflux disease)   . Hypertension   . Migraines    daily     SURGICAL HISTORY   Past Surgical History:  Procedure Laterality Date  . APPENDECTOMY    . BACK SURGERY    . COLONOSCOPY WITH PROPOFOL N/A 02/19/2016   Procedure: COLONOSCOPY WITH PROPOFOL;  Surgeon: Lucilla Lame, MD;  Location: [place];  Service: Endoscopy;  Laterality: N/A;  . COLONOSCOPY WITH PROPOFOL N/A 01/19/2018   Procedure: COLONOSCOPY WITH PROPOFOL;  Surgeon: Lucilla Lame, MD;  Location: [place];  Service: Endoscopy;  Laterality: N/A;  Diabetic - oral meds  . DG OPERATIVE LEFT HIP ([place] HX)     10/19  . ELECTROMAGNETIC NAVIGATION BROCHOSCOPY Left 11/18/2018   Procedure: ELECTROMAGNETIC NAVIGATION BRONCHOSCOPY;  Surgeon: Tyler Pita, MD;  Location: [facility];  Service: Cardiopulmonary;  Laterality: Left;  . FLEXIBLE BRONCHOSCOPY Bilateral 01/20/2019   Procedure: FLEXIBLE BRONCHOSCOPY;  Surgeon: Ottie Glazier, MD;  Location: [facility];  Service: Thoracic;  Laterality: Bilateral;  . FOOT SURGERY Left    Screws and plates  . JOINT  REPLACEMENT Left 12/2017   DR Rudene Christians Hip  . KNEE SURGERY Left    X 2  . LEG SURGERY    . POLYPECTOMY N/A 02/19/2016   Procedure: POLYPECTOMY;  Surgeon: Lucilla Lame, MD;  Location: [place];  Service: Endoscopy;  Laterality: N/A;  . POLYPECTOMY  01/19/2018   Procedure: POLYPECTOMY;  Surgeon: Lucilla Lame, MD;  Location: [place];  Service: Endoscopy;;  . THORACOTOMY Left 01/14/2019   Procedure: THORACOTOMY MAJOR, LEFT;  Surgeon: Nestor Lewandowsky, MD;  Location: [facility];  Service: General;  Laterality: Left;  . TOTAL HIP ARTHROPLASTY Left 12/02/2017   Procedure: TOTAL HIP ARTHROPLASTY ANTERIOR APPROACH;  Surgeon: Hessie Knows, MD;  Location: [facility];  Service: Orthopedics;  Laterality: Left;  [name] VIDEO BRONCHOSCOPY Left 01/14/2019   Procedure: VIDEO BRONCHOSCOPY WITH FLUORO, LEFT;  Surgeon: Nestor Lewandowsky, MD;  Location: [facility];  Service: General;  Laterality: Left;     FAMILY HISTORY   Family History  Problem Relation Age of Onset  . Cancer Father   . Diabetes Sister   . Thrombosis Sister      SOCIAL HISTORY   Social History   Tobacco Use  . Smoking status: Former Smoker    Packs/day: 0.00    Years: 35.00    Pack years: 0.00  . Smokeless tobacco: Never Used  Substance Use Topics  . Alcohol use: No    Alcohol/week: 0.0 standard drinks  . Drug use: Yes    Types: Oxycodone    Comment: prescribed     MEDICATIONS   Current Medication:  Current Facility-Administered Medications:  .  acetylcysteine (  MUCOMYST) 20 % nebulizer / oral solution 4 mL, 4 mL, Nebulization, BID, Kasa, Kurian, MD .  amiodarone (PACERONE) tablet 400 mg, 400 mg, Oral, BID, Teodoro Spray, MD, 400 mg at 01/23/19 1147 .  [START ON 01/24/2019] atorvastatin (LIPITOR) tablet 10 mg, 10 mg, Oral, q1800, Kasa, Kurian, MD .  bisacodyl (DULCOLAX) EC tablet 10 mg, 10 mg, Oral, Daily, Nestor Lewandowsky, MD, 10 mg at 01/23/19 1146 .  Chlorhexidine Gluconate Cloth 2 % PADS 6 each, 6 each,  Topical, Daily, Nestor Lewandowsky, MD, 6 each at 01/22/19 1055 .  enoxaparin (LOVENOX) injection 40 mg, 40 mg, Subcutaneous, Q24H, Oaks, Christia Reading, MD, 40 mg at 01/23/19 1144 .  hydrALAZINE (APRESOLINE) tablet 100 mg, 100 mg, Oral, BID, Nestor Lewandowsky, MD, 100 mg at 01/23/19 1146 .  insulin aspart (novoLOG) injection 0-5 Units, 0-5 Units, Subcutaneous, QHS, Olean Ree, Zachary R, PA-C .  insulin aspart (novoLOG) injection 0-9 Units, 0-9 Units, Subcutaneous, TID WC, Tylene Fantasia, PA-C, 3 Units at 01/23/19 1143 .  ipratropium-albuterol (DUONEB) 0.5-2.5 (3) MG/3ML nebulizer solution 3 mL, 3 mL, Nebulization, Q6H, Oaks, Timothy, MD, 3 mL at 01/23/19 1330 .  MEDLINE mouth rinse, 15 mL, Mouth Rinse, BID, Tukov-Yual, Magdalene S, NP, 15 mL at 01/22/19 2242 .  metoprolol tartrate (LOPRESSOR) tablet 25 mg, 25 mg, Oral, Q6H, Paraschos, Alexander, MD, 25 mg at 01/23/19 1230 .  montelukast (SINGULAIR) tablet 10 mg, 10 mg, Oral, QHS, Oaks, Christia Reading, MD, 10 mg at 01/22/19 2239 .  nortriptyline (PAMELOR) capsule 50 mg, 50 mg, Oral, QHS, Oaks, Christia Reading, MD, 50 mg at 01/19/19 2127 .  oxyCODONE (Oxy IR/ROXICODONE) immediate release tablet 10 mg, 10 mg, Oral, QID, Awilda Bill, NP, 10 mg at 01/23/19 1439 .  pantoprazole (PROTONIX) EC tablet 40 mg, 40 mg, Oral, Daily, Nestor Lewandowsky, MD, 40 mg at 01/23/19 1146 .  simethicone (MYLICON) chewable tablet 80 mg, 80 mg, Oral, Q6H PRN, Pabon, Diego F, MD, 80 mg at 01/23/19 1301  Facility-Administered Medications Ordered in Other Encounters:  .  0.9 %  sodium chloride infusion, , , Continuous PRN, Dionne Bucy, CRNA    ALLERGIES   Aspirin, Epinephrine, Lidocaine, Novocain [procaine], Penicillins, Strawberry extract, and Shellfish allergy    REVIEW OF SYSTEMS     10 point ROS conducted and is as per subjective findings, specifically no chest pain no abdominal pain  PHYSICAL EXAMINATION   Vital Signs: Temp:  [98.3 F (36.8 C)-98.7 F (37.1 C)] 98.3 F (36.8 C)  (11/21 1558) Pulse Rate:  [67-95] 90 (11/21 1558) Resp:  [12-21] 19 (11/21 1558) BP: (116-153)/(66-105) 144/103 (11/21 1558) SpO2:  [90 %-97 %] 92 % (11/21 1558) FiO2 (%):  [2 %] 2 % (11/21 0400)  GENERAL:NAD HEAD: Normocephalic, atraumatic.  EYES: Pupils equal, round, reactive to light.  No scleral icterus.  MOUTH: Moist mucosal membrane. NECK: Supple. No thyromegaly. No nodules. No JVD.  PULMONARY: CTA on right, left chest with chest tube CARDIOVASCULAR: S1 and S2. Regular rate and rhythm. No murmurs, rubs, or gallops.  GASTROINTESTINAL: Soft, nontender, non-distended. No masses. Positive bowel sounds. No hepatosplenomegaly.  MUSCULOSKELETAL: No swelling, clubbing, or edema.  NEUROLOGIC: Mild distress due to acute illness SKIN:intact,warm,dry   PERTINENT DATA     Infusions:  Scheduled Medications: . acetylcysteine  4 mL Nebulization BID  . amiodarone  400 mg Oral BID  . [START ON 01/24/2019] atorvastatin  10 mg Oral q1800  . bisacodyl  10 mg Oral Daily  . Chlorhexidine Gluconate Cloth  6 each Topical Daily  .  enoxaparin (LOVENOX) injection  40 mg Subcutaneous Q24H  . hydrALAZINE  100 mg Oral BID  . insulin aspart  0-5 Units Subcutaneous QHS  . insulin aspart  0-9 Units Subcutaneous TID WC  . ipratropium-albuterol  3 mL Nebulization Q6H  . mouth rinse  15 mL Mouth Rinse BID  . metoprolol tartrate  25 mg Oral Q6H  . montelukast  10 mg Oral QHS  . nortriptyline  50 mg Oral QHS  . oxyCODONE  10 mg Oral QID  . pantoprazole  40 mg Oral Daily   PRN Medications:  Hemodynamic parameters:   Intake/Output: 11/20 0701 - 11/21 0700 In: 101.8 [I.V.:101.8] Out: 200 [Urine:200]  Ventilator  Settings: FiO2 (%):  [2 %] 2 %     LAB RESULTS:  Basic Metabolic Panel: Recent Labs  Lab 01/17/19 0701 01/19/19 0431 01/20/19 0432 01/20/19 1608 01/21/19 0506 01/22/19 0407 01/23/19 0546  NA 135 134* 137 138  --  137 137  K 3.7 3.4* 3.5 4.0  --  3.4* 3.9  CL 99 100 102 99   --  97* 98  CO2 _0 --  29 28  GLUCOSE 176* 132* 138* 159*  --  127* 136*  BUN 25* 23* 19 18  --  19 22*  CREATININE 0.87 0.97 0.85 0.99  --  1.03 0.95  CALCIUM 8.6* 8.3* 8.8* 9.0  --  8.6* 8.6*  MG 2.5*  --  1.9  --  2.0 2.2 2.3  PHOS 3.1  --   --  3.2  --   --  3.5   Liver Function Tests: Recent Labs  Lab 01/17/19 0701 01/20/19 1608  AST 31  --   ALT 43  --   ALKPHOS 92  --   BILITOT 1.1  --   PROT 7.2  --   ALBUMIN 3.7 3.5   No results for input(s): LIPASE, AMYLASE in the last 168 hours. No results for input(s): AMMONIA in the last 168 hours. CBC: Recent Labs  Lab 01/17/19 0701 01/18/19 0439 01/22/19 0746  WBC 15.1* 13.0* 11.6*  HGB 13.6 13.5 13.6  HCT 43.2 40.9 41.5  MCV 92.5 89.1 89.2  PLT 251 257 317   Cardiac Enzymes: No results for input(s): CKTOTAL, CKMB, CKMBINDEX, TROPONINI in the last 168 hours. BNP: Invalid input(s): POCBNP CBG: Recent Labs  Lab 01/22/19 1145 01/22/19 1615 01/22/19 2130 01/23/19 0724 01/23/19 1125  GLUCAP 247* 102* 106* 130* 201*     IMAGING RESULTS:  Imaging: Dg Chest Port 1 View  Result Date: 01/22/2019 CLINICAL DATA:  LEFT chest tube EXAM: PORTABLE CHEST 1 VIEW COMPARISON:  Portable exam 0724 hours compared to 01/21/2019 FINDINGS: LEFT thoracostomy tube unchanged. Normal heart size, mediastinal contours, and pulmonary vascularity. Low lung volumes. Tiny LEFT pneumothorax. Minimal RIGHT basilar atelectasis. No infiltrate or pleural effusion. Osseous structures unremarkable. IMPRESSION: LEFT thoracostomy tube with tiny LEFT pneumothorax. Electronically Signed   By: Lavonia Dana M.D.   On: 01/22/2019 08:00      EXAM: ABDOMEN - 1 VIEW  COMPARISON:  01/16/2019  FINDINGS: Moderate distension of the stomach is again noted. There is persistent gaseous distension of the colon. Decreased stool burden noted on today's exam.  IMPRESSION: Persistent gaseous distension of the large bowel loops with  interval decrease in overall stool burden.   Electronically Signed   By: Kerby Moors M.D.   On: 01/17/2019 06:05     CLINICAL DATA:  Short of breath. Follow-up chest tube.  EXAM:  CHEST  1 VIEW  COMPARISON:  01/15/2019  FINDINGS: Left-sided chest tube is in place. No significant pneumothorax visualized. Decreased lung volumes. Pulmonary vascular congestion. Atelectasis noted in the left base.  IMPRESSION: 1. No significant change in aeration to the left lung. 2. No significant pneumothorax noted. 3. Pulmonary vascular congestion.   Electronically Signed   By: Kerby Moors M.D.   On: 01/16/2019 05:42   CLINICAL DATA:  Postoperative check, left-sided chest tube in place.  EXAM: CHEST - 2 VIEW  COMPARISON:  01/18/2019  FINDINGS: Two left-sided chest tubes remain in place. Tiny left pneumothorax is noted along the periphery of the left chest. Chest tube position similar to prior study.  Lobar airspace disease/collapse on the right as before. Probable small left effusion.  Dilated bowel loops in the upper abdomen as before. Cardiomediastinal contours are stable.  IMPRESSION: 1. Tiny left pneumothorax along the periphery of the left chest. Two left-sided chest tubes remain in place. 2. Redemonstration of volume loss and or consolidation in the right lung base and dilated bowel loops in the upper abdomen.   Electronically Signed   By: Zetta Bills M.D.   On: 01/19/2019 10:02         ASSESSMENT AND PLAN      Lung mass status post CT-guided biopsy favoring squamous cell lung CA   -Post thoracotomy with left chest tubes - removed now   -Surgery on case appreciate input   -Chest tubes - removed  -reviewed CXR in am   -stopping Lasix and Mucomyst - no longer indicated -continue physical therapy     RLL atelectasis   - continue with aggressive bronchopulmonary hygiene   - MEtaneb - please continue  - please give patient  incentive spirometer   Left tiny pneumonthorax    - continue PT and BPH as above    Ottie Glazier, M.D.  Division of [place]

## 2019-01-23 NOTE — Progress Notes (Signed)
Pt has remained alert and oriented x 4 with no c/o pain; however, pt does express generalized abd discomfort r/t distention/gas and rectal hemmorhoids.  Pt has ambulated around the unit x 2 and has been educated on the importance of movement to facilitate bowel/gas motility. Simethicone PRN has been ordered q 6 as well. Pt has not c/o nausea today, no vomiting. Pt has only tolerated small amounts of liquids thus far. Pt has remained in NSR on cardiac monitor. BP elevated-> Nehemiah Massed, MD with cardio has been made aware. Pt has not resumed his full home dose of metoprolol 125mg  BID. HR WNL.  Pt has remained on RA, SpO2 > 90%, lung sounds diminished with inspiratory/expiratiory wheezes, NDN. Wife has visited and been updated at bedside.  Pt has orders to transfer to tele.

## 2019-01-24 ENCOUNTER — Inpatient Hospital Stay: Payer: Medicare Other

## 2019-01-24 ENCOUNTER — Encounter: Payer: Self-pay | Admitting: Pulmonary Disease

## 2019-01-24 LAB — BASIC METABOLIC PANEL
Anion gap: 12 (ref 5–15)
BUN: 19 mg/dL (ref 6–20)
CO2: 27 mmol/L (ref 22–32)
Calcium: 9 mg/dL (ref 8.9–10.3)
Chloride: 98 mmol/L (ref 98–111)
Creatinine, Ser: 1.03 mg/dL (ref 0.61–1.24)
GFR calc Af Amer: >60 mL/min (ref >60)
GFR calc non Af Amer: >60 mL/min (ref >60)
Glucose, Bld: 130 mg/dL — ABNORMAL HIGH (ref 70–99)
Potassium: 3.4 mmol/L — ABNORMAL LOW (ref 3.5–5.1)
Sodium: 137 mmol/L (ref 135–145)

## 2019-01-24 LAB — MAGNESIUM: Magnesium: 2.4 mg/dL (ref 1.7–2.4)

## 2019-01-24 LAB — GLUCOSE, CAPILLARY
Glucose-Capillary: 145 mg/dL — ABNORMAL HIGH (ref 70–99)
Glucose-Capillary: 124 mg/dL — ABNORMAL HIGH (ref 70–99)
Glucose-Capillary: 127 mg/dL — ABNORMAL HIGH (ref 70–99)

## 2019-01-24 MED ORDER — HYDROMORPHONE HCL 1 MG/ML IJ SOLN
1.0000 mg | Freq: Once | INTRAMUSCULAR | Status: AC
  Administered 2019-01-24: 02:00:00 1 mg via INTRAVENOUS
  Filled 2019-01-24: qty 1

## 2019-01-24 MED ORDER — OXYCODONE HCL 5 MG PO TABS
10.0000 mg | ORAL_TABLET | Freq: Four times a day (QID) | ORAL | Status: DC | PRN
  Administered 2019-01-24 – 2019-01-25 (×3): 10 mg via ORAL
  Filled 2019-01-24 (×3): qty 2

## 2019-01-24 MED ORDER — AMLODIPINE BESYLATE 5 MG PO TABS
5.0000 mg | ORAL_TABLET | Freq: Every day | ORAL | Status: DC
  Administered 2019-01-24 – 2019-01-25 (×2): 5 mg via ORAL
  Filled 2019-01-24 (×2): qty 1

## 2019-01-24 MED ORDER — POTASSIUM CHLORIDE CRYS ER 20 MEQ PO TBCR
40.0000 meq | EXTENDED_RELEASE_TABLET | Freq: Two times a day (BID) | ORAL | Status: AC
  Administered 2019-01-24 (×2): 40 meq via ORAL
  Filled 2019-01-24 (×2): qty 2

## 2019-01-24 NOTE — Progress Notes (Signed)
[facility] Cardiology [facility] Encounter Note  Patient: Kristopher [name] / Admit Date: 01/14/2019 / Date of Encounter: 01/24/2019, 7:17 AM   Subjective: Patient feeling much better today with chest tubes being removed.  Patient's breathing is exceptionally better.  No evidence of congestive heart failure or anginal symptoms.  Patient has converted into normal sinus rhythm with intravenous and oral amiodarone.  Patient does appear to have some side effects of amiodarone with nausea and abdominal discomfort yesterday but completely resolved today.  Blood pressure has been slightly elevated and may require additional medication management  Review of Systems: Positive for: None Negative for: Vision change, hearing change, syncope, dizziness,  vomiting,diarrhea, bloody stool, stomach pain, cough, congestion, diaphoresis, urinary frequency, urinary pain,skin lesions, skin rashes Others previously listed  Objective: Telemetry: Normal sinus rhythm Physical Exam: Blood pressure (!) 147/98, pulse 77, temperature 98.5 F (36.9 C), temperature source Oral, resp. rate 17, height 5\' 10"  (1.778 m), weight 106.4 kg, SpO2 95 %. Body mass index is 33.66 kg/m. General: Well developed, well nourished, in no acute distress. Head: Normocephalic, atraumatic, sclera non-icteric, no xanthomas, nares are without discharge. Neck: No apparent masses Lungs: Normal respirations with no wheezes, few rhonchi, no rales , no crackles   Heart: Regular rate and rhythm, normal S1 S2, no murmur, no rub, no gallop, PMI is normal size and placement, carotid upstroke normal without bruit, jugular venous pressure normal Abdomen: Soft, non-tender, non-distended with normoactive bowel sounds. No hepatosplenomegaly. Abdominal aorta is normal size without bruit Extremities: No edema, no clubbing, no cyanosis, no ulcers,  Peripheral: 2+ radial, 2+ femoral, 2+ dorsal pedal pulses Neuro: Alert and oriented. Moves all extremities  spontaneously. Psych:  Responds to questions appropriately with a normal affect.   Intake/Output Summary (Last 24 hours) at 01/24/2019 0717 Last data filed at 01/24/2019 0504 Gross per 24 hour  Intake 480 ml  Output 750 ml  Net -270 ml    Inpatient Medications:  . amiodarone  200 mg Oral BID  . atorvastatin  10 mg Oral q1800  . bisacodyl  10 mg Oral Daily  . Chlorhexidine Gluconate Cloth  6 each Topical Daily  . enoxaparin (LOVENOX) injection  40 mg Subcutaneous Q24H  . hydrALAZINE  100 mg Oral BID  . insulin aspart  0-5 Units Subcutaneous QHS  . insulin aspart  0-9 Units Subcutaneous TID WC  . ipratropium-albuterol  3 mL Nebulization Q6H  . mouth rinse  15 mL Mouth Rinse BID  . metoprolol tartrate  25 mg Oral Q6H  . montelukast  10 mg Oral QHS  . nortriptyline  50 mg Oral QHS  . pantoprazole  40 mg Oral Daily  . polyethylene glycol  17 g Oral Daily   Infusions:   Labs: Recent Labs    01/22/19 0407 01/23/19 0546  NA 137 137  K 3.4* 3.9  CL 97* 98  CO2 29 28  GLUCOSE 127* 136*  BUN 19 22*  CREATININE 1.03 0.95  CALCIUM 8.6* 8.6*  MG 2.2 2.3  PHOS  --  3.5   No results for input(s): AST, ALT, ALKPHOS, BILITOT, PROT, ALBUMIN in the last 72 hours. Recent Labs    01/22/19 0746  WBC 11.6*  HGB 13.6  HCT 41.5  MCV 89.2  PLT 317   No results for input(s): CKTOTAL, CKMB, TROPONINI in the last 72 hours. Invalid input(s): POCBNP No results for input(s): HGBA1C in the last 72 hours.   Weights: Filed Weights   01/14/19 1112 01/20/19 1313  Weight: 106.4 kg 106.4 kg     Radiology/Studies:  Dg Chest 1 View  Result Date: 01/16/2019 CLINICAL DATA:  Short of breath. Follow-up chest tube. EXAM: CHEST  1 VIEW COMPARISON:  01/15/2019 FINDINGS: Left-sided chest tube is in place. No significant pneumothorax visualized. Decreased lung volumes. Pulmonary vascular congestion. Atelectasis noted in the left base. IMPRESSION: 1. No significant change in aeration to the left  lung. 2. No significant pneumothorax noted. 3. Pulmonary vascular congestion. Electronically Signed   By: Kerby Moors M.D.   On: 01/16/2019 05:42   Dg Chest 2 View  Result Date: 01/19/2019 CLINICAL DATA:  Postoperative check, left-sided chest tube in place. EXAM: CHEST - 2 VIEW COMPARISON:  01/18/2019 FINDINGS: Two left-sided chest tubes remain in place. Tiny left pneumothorax is noted along the periphery of the left chest. Chest tube position similar to prior study. Lobar airspace disease/collapse on the right as before. Probable small left effusion. Dilated bowel loops in the upper abdomen as before. Cardiomediastinal contours are stable. IMPRESSION: 1. Tiny left pneumothorax along the periphery of the left chest. Two left-sided chest tubes remain in place. 2. Redemonstration of volume loss and or consolidation in the right lung base and dilated bowel loops in the upper abdomen. Electronically Signed   By: Zetta Bills M.D.   On: 01/19/2019 10:02   Dg Chest 2 View  Result Date: 01/15/2019 CLINICAL DATA:  Postoperative EXAM: CHEST - 2 VIEW COMPARISON:  01/14/2019 FINDINGS: No significant change in examination status post left thoracotomy with left-sided chest tubes in position and no significant pneumothorax. Very low volume examination with mild diffuse interstitial opacity, likely bronchovascular crowding. No new airspace opacity. Mild cardiomegaly. Subcutaneous emphysema about the left chest wall. IMPRESSION: 1. No significant change in examination status post left thoracotomy with left-sided chest tubes in position and no significant pneumothorax. 2. Very low volume examination with mild diffuse interstitial opacity, likely bronchovascular crowding. No new airspace opacity. Electronically Signed   By: Eddie Candle M.D.   On: 01/15/2019 09:19   Dg Abd 1 View  Result Date: 01/17/2019 CLINICAL DATA:  Abdominal distention. EXAM: ABDOMEN - 1 VIEW COMPARISON:  01/16/2019 FINDINGS: Moderate  distension of the stomach is again noted. There is persistent gaseous distension of the colon. Decreased stool burden noted on today's exam. IMPRESSION: Persistent gaseous distension of the large bowel loops with interval decrease in overall stool burden. Electronically Signed   By: Kerby Moors M.D.   On: 01/17/2019 06:05   Dg Chest Port 1 View  Result Date: 01/22/2019 CLINICAL DATA:  LEFT chest tube EXAM: PORTABLE CHEST 1 VIEW COMPARISON:  Portable exam 0724 hours compared to 01/21/2019 FINDINGS: LEFT thoracostomy tube unchanged. Normal heart size, mediastinal contours, and pulmonary vascularity. Low lung volumes. Tiny LEFT pneumothorax. Minimal RIGHT basilar atelectasis. No infiltrate or pleural effusion. Osseous structures unremarkable. IMPRESSION: LEFT thoracostomy tube with tiny LEFT pneumothorax. Electronically Signed   By: Lavonia Dana M.D.   On: 01/22/2019 08:00   Dg Chest Port 1 View  Result Date: 01/21/2019 CLINICAL DATA:  Recent left-sided thoracotomy and video bronchoscopy EXAM: PORTABLE CHEST 1 VIEW COMPARISON:  January 19, 2019 FINDINGS: Chest tubes on the left are unchanged in position. Minimal pneumothorax on the left laterally is again noted without tension component. Consolidation in the medial right base is stable. No new opacity evident. No appreciable pleural effusion. Heart size and pulmonary vascularity are normal. No adenopathy. No bone lesions. IMPRESSION: Stable minimal pneumothorax on the left laterally without tension component.  No change in chest tube positions. Consolidation medial right base is stable. No new opacity. Stable cardiac silhouette. Electronically Signed   By: Lowella Grip III M.D.   On: 01/21/2019 08:26   Dg Chest Port 1 View  Result Date: 01/18/2019 CLINICAL DATA:  Chest tube in place EXAM: PORTABLE CHEST 1 VIEW COMPARISON:  Two days ago FINDINGS: Low volume chest with interval lobar collapse at the right base. Stable heart size accentuated by low  volumes. No edema, effusion, or pneumothorax. Large bore chest tube at the left base. IMPRESSION: Interval lobar collapse at the right base.  Lung volumes are low. No visible pneumothorax. Electronically Signed   By: Monte Fantasia M.D.   On: 01/18/2019 06:53   Dg Chest Port 1 View  Result Date: 01/14/2019 CLINICAL DATA:  Postop thoracotomy, left. EXAM: PORTABLE CHEST 1 VIEW COMPARISON:  Preoperative radiograph 12/16/2018, CT 11/17/2018 FINDINGS: Two left-sided chest tubes in place, 1 directed towards the apex, 1 towards the base. No definite visualized pneumothorax. Subcutaneous emphysema about the left lateral chest wall with adjacent skin staples. Lung volumes are low. Upper normal heart size, accentuated by low lung volumes. Bronchovascular crowding without pulmonary edema. No focal abnormality in the right lung. IMPRESSION: 1. Two left-sided chest tubes in place. No definite pneumothorax. Subcutaneous emphysema about the left lateral chest wall. 2. Low lung volumes with bronchovascular crowding. Electronically Signed   By: Keith Rake M.D.   On: 01/14/2019 17:59   Dg Abd Portable 2v  Result Date: 01/16/2019 CLINICAL DATA:  Short of breath. Chest tube. EXAM: PORTABLE ABDOMEN - 2 VIEW COMPARISON:  10/30/2017 FINDINGS: Moderate distension of the stomach. There is diffuse gaseous distension of the colon with a moderate stool burden identified within the right colon and descending colon. No significant small bowel dilatation identified. Previous left hip arthroplasty and anterior disc fusion of the lower lumbar spine. IMPRESSION: 1. Moderate gaseous distension of the, nonspecific. This may reflect colonic ileus versus distal bowel obstruction. Clinical correlation advised. 2. Moderate stool burden within the right colon and descending colon. Electronically Signed   By: Kerby Moors M.D.   On: 01/16/2019 05:44     Assessment and Recommendation  58 y.o. male with atrial fibrillation with rapid  ventricular rate status post thoracotomy multifactorial in nature now back in normal rhythm with appropriate medication management including amiodarone and metoprolol without evidence of congestive heart failure or myocardial infarction 1.  Continue amiodarone metoprolol combination for maintenance of normal sinus rhythm after episode of atrial fibrillation.  The patient will be best served by changing amiodarone to 200 mg twice per day upon discharge to home and continue metoprolol at 50 mg twice per day. 2.  No anticoagulation at this time due to concerns of bleeding complications status post thoracotomy and other bronc.  Will make decisions of further anticoagulation and with follow-up next week in the office 3.  Addition of a low-dose amlodipine for blood pressure control in addition to current medical regimen 4.  If ambulating well okay for discharged home from cardiac standpoint  Signed, Serafina Royals M.D. FACC

## 2019-01-24 NOTE — Progress Notes (Signed)
Pharmacy Electrolyte Monitoring Consult:  Pharmacy consulted to assist in monitoring and replacing electrolytes in this 58 y.o. male admitted on 01/14/2019 for video bronchoscopy and left thoracotomy due to a postsurgical bout of atrial fibrillation, currently resolved. Patient with CHADSVASc of 2 (Diabetes and Hypertension).   No MIVF. Patient has been advanced to a regular diet.   Labs:  Sodium (mmol/L)  Date Value  01/24/2019 137  10/06/2018 142   Potassium (mmol/L)  Date Value  01/24/2019 3.4 (L)   Magnesium (mg/dL)  Date Value  01/24/2019 2.4   Phosphorus (mg/dL)  Date Value  01/23/2019 3.5   Calcium (mg/dL)  Date Value  01/24/2019 9.0   Albumin (g/dL)  Date Value  01/20/2019 3.5  10/06/2018 4.5   Corrected Ca: 9.0 mg/dL  Goals of Therapy:  - K ~ 4  - Mg ~ 2 - All other electrolytes within normal limits.  Assessment/Plan: - KCL 51meq x 2 doses today. - Will check BMP with AM labs.  Thank you for allowing pharmacy to be a part of this patient's care.  Olivia Canter, [facility] 01/24/2019 8:37 AM

## 2019-01-24 NOTE — Progress Notes (Signed)
POD # 10 NSR Main issues is abd distension from ileus /narcotics Having some loosse BM, taking PO AVSS Xray w diffuse distension no free air  PE NAD Lungs CTA, incision c/d/i, no infection. Abd: distended w decrease bs, no peritonitis  A/P Doing well Doing degree of distension I would like to keep him one more day and repeat xray in am Ambulate

## 2019-01-24 NOTE — Progress Notes (Signed)
Pt complainig of Left sided pain. Called the MD got a new order for Dilaudid one time dose. While waiting for the pharmacy to verify the medication. Pt came out to the nurses station  Complaining that its taking too long. Patient became upset and walked to ICU and started cussing and complaining with the staff on the ICU. Had to get [facility] involved to get pt. Back over to 2A. After calming pt. Down he received his pain medicine and went to sleep for the rest of the night.

## 2019-01-24 NOTE — Plan of Care (Signed)
Ambulating long distances without distress; passing flatus to relieve abdominal distention.   Problem: Education: Goal: Knowledge of General Education information will improve Description: Including pain rating scale, medication(s)/side effects and non-pharmacologic comfort measures Outcome: Progressing   Problem: Health Behavior/Discharge Planning: Goal: Ability to manage health-related needs will improve Outcome: Progressing   Problem: Clinical Measurements: Goal: Ability to maintain clinical measurements within normal limits will improve Outcome: Progressing Goal: Will remain free from infection Outcome: Progressing Goal: Diagnostic test results will improve Outcome: Progressing Goal: Respiratory complications will improve Outcome: Progressing Goal: Cardiovascular complication will be avoided Outcome: Progressing   Problem: Activity: Goal: Risk for activity intolerance will decrease Outcome: Progressing   Problem: Nutrition: Goal: Adequate nutrition will be maintained Outcome: Progressing   Problem: Coping: Goal: Level of anxiety will decrease Outcome: Progressing   Problem: Elimination: Goal: Will not experience complications related to bowel motility Outcome: Progressing Goal: Will not experience complications related to urinary retention Outcome: Progressing   Problem: Pain Managment: Goal: General experience of comfort will improve Outcome: Progressing   Problem: Safety: Goal: Ability to remain free from injury will improve Outcome: Progressing   Problem: Skin Integrity: Goal: Risk for impaired skin integrity will decrease Outcome: Progressing

## 2019-01-25 ENCOUNTER — Encounter: Payer: Self-pay | Admitting: Pulmonary Disease

## 2019-01-25 ENCOUNTER — Inpatient Hospital Stay: Payer: Medicare Other

## 2019-01-25 LAB — BASIC METABOLIC PANEL
Anion gap: 10 (ref 5–15)
BUN: 17 mg/dL (ref 6–20)
CO2: 26 mmol/L (ref 22–32)
Calcium: 8.9 mg/dL (ref 8.9–10.3)
Chloride: 102 mmol/L (ref 98–111)
Creatinine, Ser: 1.07 mg/dL (ref 0.61–1.24)
GFR calc Af Amer: >60 mL/min (ref >60)
GFR calc non Af Amer: >60 mL/min (ref >60)
Glucose, Bld: 136 mg/dL — ABNORMAL HIGH (ref 70–99)
Potassium: 3.8 mmol/L (ref 3.5–5.1)
Sodium: 138 mmol/L (ref 135–145)

## 2019-01-25 LAB — GLUCOSE, CAPILLARY
Glucose-Capillary: 115 mg/dL — ABNORMAL HIGH (ref 70–99)
Glucose-Capillary: 152 mg/dL — ABNORMAL HIGH (ref 70–99)

## 2019-01-25 LAB — MAGNESIUM: Magnesium: 2.4 mg/dL (ref 1.7–2.4)

## 2019-01-25 MED ORDER — PANTOPRAZOLE SODIUM 40 MG PO TBEC: 40.0000 mg | DELAYED_RELEASE_TABLET | Freq: Every day | ORAL | 2 refills | Status: DC

## 2019-01-25 MED ORDER — OXYCODONE HCL 5 MG PO TABS: 5.0000 mg | ORAL_TABLET | Freq: Four times a day (QID) | ORAL | 0 refills | Status: DC | PRN

## 2019-01-25 MED ORDER — AMLODIPINE BESYLATE 5 MG PO TABS: 5.0000 mg | ORAL_TABLET | Freq: Every day | ORAL | 2 refills | Status: DC

## 2019-01-25 MED ORDER — HYDRALAZINE HCL 100 MG PO TABS: 100.0000 mg | ORAL_TABLET | Freq: Two times a day (BID) | ORAL | 2 refills | Status: DC

## 2019-01-25 MED ORDER — BISACODYL 5 MG PO TBEC: 10.0000 mg | DELAYED_RELEASE_TABLET | Freq: Every day | ORAL | 0 refills | Status: DC

## 2019-01-25 MED ORDER — OXYCODONE HCL 5 MG PO TABS: 5.0000 mg | ORAL_TABLET | Freq: Four times a day (QID) | ORAL | Status: DC | PRN

## 2019-01-25 MED ORDER — ATORVASTATIN CALCIUM 10 MG PO TABS: 10.0000 mg | ORAL_TABLET | Freq: Every day | ORAL | 2 refills | Status: DC

## 2019-01-25 MED ORDER — POTASSIUM CHLORIDE CRYS ER 20 MEQ PO TBCR
20.0000 meq | EXTENDED_RELEASE_TABLET | Freq: Once | ORAL | Status: DC
  Filled 2019-01-25: qty 1

## 2019-01-25 MED ORDER — METOPROLOL TARTRATE 50 MG PO TABS: 50.0000 mg | ORAL_TABLET | Freq: Two times a day (BID) | ORAL | 2 refills | Status: DC

## 2019-01-25 MED ORDER — SIMETHICONE 80 MG PO CHEW: 80.0000 mg | CHEWABLE_TABLET | Freq: Four times a day (QID) | ORAL | 0 refills | Status: DC | PRN

## 2019-01-25 MED ORDER — METOPROLOL TARTRATE 50 MG PO TABS: 50.0000 mg | ORAL_TABLET | Freq: Two times a day (BID) | ORAL | Status: DC

## 2019-01-25 MED ORDER — AMIODARONE HCL 200 MG PO TABS: 200.0000 mg | ORAL_TABLET | Freq: Two times a day (BID) | ORAL | 2 refills | Status: DC

## 2019-01-25 NOTE — Progress Notes (Signed)
Pharmacy Electrolyte Monitoring Consult:  Pharmacy consulted to assist in monitoring and replacing electrolytes in this 58 y.o. male admitted on 01/14/2019 for video bronchoscopy and left thoracotomy due to a postsurgical bout of atrial fibrillation, currently resolved. Patient with CHADSVASc of 2 (Diabetes and Hypertension).   No MIVF. Patient has been advanced to a regular diet.   Labs:  Sodium (mmol/L)  Date Value  01/25/2019 138  10/06/2018 142   Potassium (mmol/L)  Date Value  01/25/2019 3.8   Magnesium (mg/dL)  Date Value  01/25/2019 2.4   Phosphorus (mg/dL)  Date Value  01/23/2019 3.5   Calcium (mg/dL)  Date Value  01/25/2019 8.9   Albumin (g/dL)  Date Value  01/20/2019 3.5  10/06/2018 4.5   Corrected Ca: 9.0 mg/dL  Goals of Therapy:  - K ~ 4  - Mg ~ 2 - All other electrolytes within normal limits.  Assessment/Plan: - KCL 39meq x 1 doses today. - Will check BMP with AM labs.  Thank you for allowing pharmacy to be a part of this patient's care.  Oswald Hillock, PharmD, BCPS 01/25/2019 8:51 AM

## 2019-01-25 NOTE — Progress Notes (Signed)
Breathing is "perfect".  Had multiple stools yesterday.  Passing large amounts of flatus.  VSS, Afeb  Lungs equal and clear Regular rhythm Abd is distended but nontender with active bowel sounds.  Some higher pitched sounds Wounds clean and dry  Reviewed his XRays from this morning.  They are essentially unchanged..  Will ask General Surgery to see.  If OK, may discharge to home.  [employer].

## 2019-01-25 NOTE — Care Management Important Message (Signed)
Important Message  Patient Details  Name: Kristopher [name] MRN: [id number] Date of Birth: [birth year]   Medicare Important Message Given:  Yes     Dannette Barbara 01/25/2019, 11:18 AM

## 2019-01-25 NOTE — Progress Notes (Signed)
Pt given discharge instructions, all questions answered. Pt was given Oxycodone prescription by MD. All other prescriptions were sent electronically to pharmacy. Patient is waiting for his ride to get here.

## 2019-01-25 NOTE — Discharge Summary (Signed)
Physician Discharge Summary  Patient ID: GIO JANOSKI MRN: [id number] DOB/AGE: 1960-03-10 58 y.o.  Admit date: 01/14/2019 Discharge date: 01/25/2019   Discharge Diagnoses:  Active Problems:   Lung mass   Atrial fibrillation with RVR (HCC)   Shortness of breath   Acute respiratory failure with hypoxia (HCC)   Procedures: Left thoracotomy and Left Lower Lobectomy  Hospital Course: Admitted to ICU postop following the above procedure.  Transferred to floor on postop day 1 but over the next week required two admissions to the step down unit for treatment of atrial fibrillation with RVR.  Treated with beta blockers and Amiodarone.  Ultimately, converted to sinus and stayed in sinus.  Echo was unremarkable.  Developed abdominal distention secondary to narcotic induced ileus and this ultimately improved.  Had large amounts of secretions requiring multiple inhalers and chest physiotherapy.  Last CXRay showed the lungs to be better aerated.  Final pathology was stage I squamous cell carcinoma of the lung.  Disposition: Discharge disposition: 01-Home or Self Care       Discharge Instructions    Diet - low sodium heart healthy   Complete by: As directed    Discharge instructions   Complete by: As directed    Shower daily and wash over wounds with soap and water.  Call you family doctor and make appointment for one week after discharge.  Call my office and make appointment for one week.  Call Dr. Bethanne Ginger office in Cardiology and make appointment for one week.   Increase activity slowly   Complete by: As directed      Allergies as of 01/25/2019      Reactions   Aspirin Anaphylaxis   Epinephrine Anaphylaxis   Does not include albuterol   Lidocaine Anaphylaxis   Novocain [procaine] Anaphylaxis   Penicillins Anaphylaxis   Has patient had a PCN reaction causing immediate rash, facial/tongue/throat swelling, SOB or lightheadedness with hypotension: Yes Has patient had a PCN reaction  causing severe rash involving mucus membranes or skin necrosis: No Has patient had a PCN reaction that required hospitalization: Yes Has patient had a PCN reaction occurring within the last 10 years: No If all of the above answers are "NO", then may proceed with Cephalosporin use.   Strawberry Extract Anaphylaxis   Shellfish Allergy Hives, Nausea And Vomiting      Medication List    STOP taking these medications   Accu-Chek Guide test strip Generic drug: glucose blood   albuterol 108 (90 Base) MCG/ACT inhaler Commonly known as: VENTOLIN HFA   Arnuity Ellipta 100 MCG/ACT Aepb Generic drug: Fluticasone Furoate   esomeprazole 40 MG capsule Commonly known as: NexIUM Replaced by: pantoprazole 40 MG tablet   metFORMIN 500 MG 24 hr tablet Commonly known as: GLUCOPHAGE-XR   metoprolol succinate 100 MG 24 hr tablet Commonly known as: TOPROL-XL   metoprolol succinate 25 MG 24 hr tablet Commonly known as: TOPROL-XL   montelukast 10 MG tablet Commonly known as: SINGULAIR   nortriptyline 25 MG capsule Commonly known as: Pamelor   Potassium Chloride ER 20 MEQ Tbcr   SUMAtriptan 100 MG tablet Commonly known as: IMITREX     TAKE these medications   amiodarone 200 MG tablet Commonly known as: PACERONE Take 1 tablet (200 mg total) by mouth 2 (two) times daily.   amLODipine 5 MG tablet Commonly known as: NORVASC Take 1 tablet (5 mg total) by mouth daily. Start taking on: January 26, 2019   atorvastatin 10 MG tablet Commonly known  as: LIPITOR Take 1 tablet (10 mg total) by mouth daily at 6 PM. What changed: when to take this   bisacodyl 5 MG EC tablet Commonly known as: DULCOLAX Take 2 tablets (10 mg total) by mouth daily. Start taking on: January 26, 2019   hydrALAZINE 100 MG tablet Commonly known as: APRESOLINE Take 1 tablet (100 mg total) by mouth 2 (two) times daily.   metoprolol tartrate 50 MG tablet Commonly known as: LOPRESSOR Take 1 tablet (50 mg total) by  mouth 2 (two) times daily.   oxyCODONE 5 MG immediate release tablet Commonly known as: Oxy IR/ROXICODONE Take 1 tablet (5 mg total) by mouth every 6 (six) hours as needed for moderate pain or severe pain. What changed:   medication strength  how much to take  when to take this  reasons to take this   pantoprazole 40 MG tablet Commonly known as: PROTONIX Take 1 tablet (40 mg total) by mouth daily. Start taking on: January 26, 2019 Replaces: esomeprazole 40 MG capsule   simethicone 80 MG chewable tablet Commonly known as: MYLICON Chew 1 tablet (80 mg total) by mouth every 6 (six) hours as needed for flatulence.        Nestor Lewandowsky, MD

## 2019-01-25 NOTE — TOC Transition Note (Signed)
Transition of Care [facility]) - CM/SW Discharge Note   Patient Details  Name: Kristopher [name] MRN: [id number] Date of Birth: [birth year]  [facility] CM/SW Contact:  Ross Ludwig, LCSW Phone Number: 01/25/2019, 12:26 PM   Clinical Narrative:    Patient does not have any other needs currently.  Patient did not have any questions or concerns.  Patient will be discharging back home.   Final next level of care: Home/Self Care Barriers to Discharge: Barriers Resolved   Patient Goals and CMS Choice Patient states their goals for this hospitalization and ongoing recovery are:: To return back home. CMS Medicare.gov Compare Post Acute Care list provided to:: Patient Choice offered to / list presented to : Patient  Discharge Placement  Patient will be discharging back home, with self-care.                 Discharge Plan and Services   Discharge Planning Services: CM Consult Post Acute Care Choice: Home Health          DME Arranged: N/A DME Agency: NA       HH Arranged: NA [place] Agency: [facility] [employer]) Date [place]: 01/20/19 Time [address]: 1001 Representative spoke with at [place] at [place] ([place]) Interventions     Readmission Risk Interventions Readmission Risk Prevention Plan 01/20/2019  Transportation Screening Complete  HRI or [place] Complete  Palliative Care Screening Not Applicable  Medication Review (RN Care Manager) Complete  Some recent data might be hidden

## 2019-01-26 ENCOUNTER — Telehealth: Payer: Self-pay | Admitting: Family Medicine

## 2019-01-26 ENCOUNTER — Ambulatory Visit: Payer: Self-pay | Admitting: Pharmacist

## 2019-01-26 DIAGNOSIS — R519 Headache, unspecified: Secondary | ICD-10-CM

## 2019-01-26 DIAGNOSIS — I129 Hypertensive chronic kidney disease with stage 1 through stage 4 chronic kidney disease, or unspecified chronic kidney disease: Secondary | ICD-10-CM

## 2019-01-26 NOTE — Chronic Care Management (AMB) (Signed)
Chronic Care Management   Follow Up Note   01/26/2019 Name: Kristopher [name] MRN: [id number] DOB: 1960-11-08  Referred by: [name], DO Reason for referral : Chronic Care Management (Medication Management)   Kristopher [name] is a 58 y.o. year old male who is a primary care patient of [name], DO. The CCM team was consulted for assistance with chronic disease management and care coordination needs.    Received request to contact patient for post discharge medication review.  Review of patient status, including review of consultants reports, relevant laboratory and other test results, and collaboration with appropriate care team members and the patient's provider was performed as part of comprehensive patient evaluation and provision of chronic care management services.    SDOH (Social Determinants of Health) screening performed today: None. See Care Plan for related entries.   Outpatient Encounter Medications as of 01/26/2019  Medication Sig Note  . amiodarone (PACERONE) 200 MG tablet Take 1 tablet (200 mg total) by mouth 2 (two) times daily.   [name] amLODipine (NORVASC) 5 MG tablet Take 1 tablet (5 mg total) by mouth daily. 01/26/2019: AM  . atorvastatin (LIPITOR) 10 MG tablet Take 1 tablet (10 mg total) by mouth daily at 6 PM.   . bisacodyl (DULCOLAX) 5 MG EC tablet Take 2 tablets (10 mg total) by mouth daily.   . hydrALAZINE (APRESOLINE) 100 MG tablet Take 1 tablet (100 mg total) by mouth 2 (two) times daily.   . metoprolol tartrate (LOPRESSOR) 50 MG tablet Take 1 tablet (50 mg total) by mouth 2 (two) times daily.   [name] oxyCODONE (OXY IR/ROXICODONE) 5 MG immediate release tablet Take 1 tablet (5 mg total) by mouth every 6 (six) hours as needed for moderate pain or severe pain.   . pantoprazole (PROTONIX) 40 MG tablet Take 1 tablet (40 mg total) by mouth daily.   . simethicone (MYLICON) 80 MG chewable tablet Chew 1 tablet (80 mg total) by mouth every 6 (six) hours as needed  for flatulence.    Facility-Administered Encounter Medications as of 01/26/2019  Medication  . 0.9 %  sodium chloride infusion     Goals Addressed            This Visit's Progress     Patient Stated   . PharmD "I don't like taking medications" (pt-stated)       Current Barriers:  . Polypharmacy; complex patient with multiple comorbidities including recent L thoracotomy and L lower lobectomy (admission 11/12-11/23); new onset Afib during hospitalization; hx tobacco abuse, HTN, migraines . Received call today from wife with questions regarding discharge summary. She was confused why many of his chronic medications were "discontinued" o Patient to "stop" Arnuity, metformin, nortriptyline, potassium, sumatriptan, esomeprazole o "Start"/continue: Amiodarone 200 mg BID, amlodipine 5 mg QAM, atorvastatin 10 mg daily, hydralazine 100 mg BID, metoprolol 50 mg BID, pantoprazole  Pharmacist Clinical Goal(s):  [name] Over the next 90 days, patient will work with PharmD and provider towards optimized medication management  Interventions: . Comprehensive medication review performed; medication list updated in electronic medical record . Discussed w/ wife mechanism of action/effects of amiodarone, and relation to why antihypertensive regimen was adjusted.  . Discussed formulary preferences and med rec process at discharge, and how patient was likely just discharged on the regimen he had been receiving in the hospital. Reviewed that many chronic medications (including metformin, Arnuity, nortriptyline, sumatriptan) would likely be restarted tomorrow by Dr. Wynetta Emery, if not later as he recovers  .  Patient and wife denied any questions or concerns at this time  Patient Self Care Activities:  . Patient will take medications as prescribed  Please see past updates related to this goal by clicking on the "Past Updates" button in the selected goal          Plan: - Will f/u with patient for continued  medication management in the next 3-4 weeks  Catie Darnelle Maffucci, PharmD, [place] [phone]

## 2019-01-26 NOTE — Patient Instructions (Signed)
Visit Information  Goals Addressed            This Visit's Progress     Patient Stated   . PharmD "I don't like taking medications" (pt-stated)       Current Barriers:  . Polypharmacy; complex patient with multiple comorbidities including recent L thoracotomy and L lower lobectomy (admission 11/12-11/23); new onset Afib during hospitalization; hx tobacco abuse, HTN, migraines . Received call today from wife with questions regarding discharge summary. She was confused why many of his chronic medications were "discontinued" o Patient to "stop" Arnuity, metformin, nortriptyline, potassium, sumatriptan, esomeprazole o "Start"/continue: Amiodarone 200 mg BID, amlodipine 5 mg QAM, atorvastatin 10 mg daily, hydralazine 100 mg BID, metoprolol 50 mg BID, pantoprazole  Pharmacist Clinical Goal(s):  [name] Over the next 90 days, patient will work with PharmD and provider towards optimized medication management  Interventions: . Comprehensive medication review performed; medication list updated in electronic medical record . Discussed w/ wife mechanism of action/effects of amiodarone, and relation to why antihypertensive regimen was adjusted.  . Discussed formulary preferences and med rec process at discharge, and how patient was likely just discharged on the regimen he had been receiving in the hospital. Reviewed that many chronic medications (including metformin, Arnuity, nortriptyline, sumatriptan) would likely be restarted tomorrow by Dr. Wynetta Emery, if not later as he recovers  . Patient and wife denied any questions or concerns at this time  Patient Self Care Activities:  . Patient will take medications as prescribed  Please see past updates related to this goal by clicking on the "Past Updates" button in the selected goal         The patient verbalized understanding of instructions provided today and declined a print copy of patient instruction materials.   Plan: - Will f/u with patient for  continued medication management in the next 3-4 weeks  Catie Darnelle Maffucci, PharmD, [place] [phone]

## 2019-01-26 NOTE — Telephone Encounter (Signed)
Copied from [place] [id number]. Topic: General - Inquiry >> Jan 26, 2019  8:07 AM Richardo Priest, NT wrote: Reason for CRM: Pt's family friend called in stating that patient was in hospital recently and had surgery but is being told to stop all medication. They would like clarification before doing this as they would hate for something to happen again. Pt is scheduled for office visit 11/25. Please advise.

## 2019-01-26 NOTE — Telephone Encounter (Signed)
He is not supposed to stop everything but things were changed. Please confirm medication changes with patient per discharge summary. Thanks so much!

## 2019-01-27 ENCOUNTER — Ambulatory Visit (INDEPENDENT_AMBULATORY_CARE_PROVIDER_SITE_OTHER): Payer: Medicare Other | Admitting: Family Medicine

## 2019-01-27 ENCOUNTER — Encounter: Payer: Self-pay | Admitting: Family Medicine

## 2019-01-27 ENCOUNTER — Other Ambulatory Visit: Payer: Self-pay

## 2019-01-27 DIAGNOSIS — R918 Other nonspecific abnormal finding of lung field: Secondary | ICD-10-CM

## 2019-01-27 DIAGNOSIS — I4891 Unspecified atrial fibrillation: Secondary | ICD-10-CM | POA: Diagnosis not present

## 2019-01-27 DIAGNOSIS — I129 Hypertensive chronic kidney disease with stage 1 through stage 4 chronic kidney disease, or unspecified chronic kidney disease: Secondary | ICD-10-CM

## 2019-01-27 MED ORDER — AMLODIPINE BESYLATE 5 MG PO TABS: 5.0000 mg | ORAL_TABLET | Freq: Two times a day (BID) | ORAL | 3 refills | Status: DC

## 2019-01-27 NOTE — Progress Notes (Signed)
BP (!) 141/78   Pulse 77   SpO2 97%    Subjective:    Patient ID: Kristopher [name], male    DOB: 1960-11-15, 58 y.o.   MRN: [id number]  HPI: Kristopher [name] is a 58 y.o. male  Chief Complaint  Patient presents with  . Hospitalization Follow-up    01/14/19 lung cancer removed, stopped all diabetes medication, allergy meds, inhalers   Transition of Care Hospital Follow up.   Hospital/Facility: [facility] Physician: Nest[facility] Lewandowsky D/C Date: 01/25/19  Records Requested: 01/26/19 Records Received: 01/25/19 Records Reviewed: 01/26/19  Diagnoses on Discharge: Lung mass, A fib with RVR, SOB, Acute respiratory failure  Date of interactive Contact within 48 hours of discharge: 01/26/19 Contact was through: phone  Date of 7 day or 14 day face-to-face visit: 01/27/19  within 7 days  Outpatient Encounter Medications as of 01/27/2019  Medication Sig Note  . amiodarone (PACERONE) 200 MG tablet Take 1 tablet (200 mg total) by mouth 2 (two) times daily.   [name] amLODipine (NORVASC) 5 MG tablet Take 1 tablet (5 mg total) by mouth 2 (two) times daily.   [name] atorvastatin (LIPITOR) 10 MG tablet Take 1 tablet (10 mg total) by mouth daily at 6 PM.   . bisacodyl (DULCOLAX) 5 MG EC tablet Take 2 tablets (10 mg total) by mouth daily.   . hydrALAZINE (APRESOLINE) 100 MG tablet Take 1 tablet (100 mg total) by mouth 2 (two) times daily.   . metoprolol tartrate (LOPRESSOR) 50 MG tablet Take 1 tablet (50 mg total) by mouth 2 (two) times daily.   [name] oxyCODONE (OXY IR/ROXICODONE) 5 MG immediate release tablet Take 1 tablet (5 mg total) by mouth every 6 (six) hours as needed for moderate pain or severe pain.   . pantoprazole (PROTONIX) 40 MG tablet Take 1 tablet (40 mg total) by mouth daily.   . simethicone (MYLICON) 80 MG chewable tablet Chew 1 tablet (80 mg total) by mouth every 6 (six) hours as needed for flatulence.   . [DISCONTINUED] amLODipine (NORVASC) 5 MG tablet Take 1 tablet (5 mg total) by mouth daily.  01/26/2019: AM   Facility-Administered Encounter Medications as of 01/27/2019  Medication  . 0.9 %  sodium chloride infusion  Per Discharge Summary: "Procedures: Left thoracotomy and Left Lower Lobectomy  Hospital Course: Admitted to ICU postop following the above procedure.  Transferred to floor on postop day 1 but over the next week required two admissions to the step down unit for treatment of atrial fibrillation with RVR.  Treated with beta blockers and Amiodarone.  Ultimately, converted to sinus and stayed in sinus.  Echo was unremarkable.  Developed abdominal distention secondary to narcotic induced ileus and this ultimately improved.  Had large amounts of secretions requiring multiple inhalers and chest physiotherapy.  Last CXRay showed the lungs to be better aerated.  Final pathology was stage I squamous cell carcinoma of the lung."  Diagnostic Tests Reviewed: CLINICAL DATA:  Ileus.  Former smoker.  EXAM: DG ABDOMEN ACUTE W/ 1V CHEST  COMPARISON:  Chest radiograph-01/24/2019; abdominal radiograph-01/17/2019  FINDINGS: Grossly unchanged cardiac silhouette and mediastinal contours given persistently reduced lung volumes. Overall improved aeration of lung bases with persistent bibasilar opacities, left greater than right. No new focal airspace opacities. No pleural effusion or pneumothorax. No evidence of edema.  Redemonstrated marked gaseous distention of multiple loops of large and small bowel with index loop ascending colon measuring approximately 8.5 cm in diameter. No pneumoperitoneum, pneumatosis or portal venous  gas.  Skin staples again overlie the left upper abdomen.  Postoperative change of the lower lumbar spine, incompletely evaluated.  IMPRESSION: 1. Improved aeration of the lungs with persistent bibasilar opacities right greater than left, atelectasis versus infiltrate. 2. Grossly unchanged marked gaseous distention of the large and small bowel most  suggestive of ileus though conceivably a distal colonic obstruction could have a similar appearance. Clinical correlation is advised.  CLINICAL DATA:  Acute respiratory failure.  EXAM: PORTABLE CHEST 1 VIEW  COMPARISON:  01/22/2019  FINDINGS: 0543 hours. Low lung volumes. The cardio pericardial silhouette is enlarged. Interval increase and right base collapse/consolidation. The visualized bony structures of the thorax are intact. Gaseous bowel distention noted in the visualized upper abdomen.  IMPRESSION: Low volume film with progressive opacity at the right base, likely Atelectasis.  Flexible bronchoscopy on 01/21/19    ECHOCARDIOGRAM REPORT       Patient Name:   Kristopher [name] Date of Exam: 01/16/2019 Medical Rec #:  [id number]        Height:       70.0 in Accession #:    [id number]       Weight:       234.5 lb Date of Birth:  Dec 20, 1960       BSA:          2.23 m Patient Age:    55 years         BP:           151/96 mmHg Patient Gender: M                HR:           126 bpm. Exam Location:  [facility]  Procedure: 2D Echo  Indications:     Atrial Fibrillation   History:         Patient has no prior history of Echocardiogram examinations.                  Risk Factors:Current Smoker and Hypertension.   Sonographer:     LTM Referring Phys:  [id number] Bing Neighbors OUMA Diagnosing Phys: Bartholome Bill MD    Sonographer Comments: Image acquisition challenging due to patient body habitus. IMPRESSIONS    1. Left ventricular ejection fraction, by visual estimation, is 55 to 60%. The left ventricle has hyperdynamic function. Left ventricular septal wall thickness was moderately increased. Moderately increased left ventricular posterior wall thickness.  There is moderately increased left ventricular hypertrophy.  2. Left ventricular diastolic parameters are consistent with Grade I diastolic dysfunction (impaired relaxation).  3. Global right ventricle was not  well visualized.The right ventricular size is not well visualized. Right vetricular wall thickness was not assessed.  4. Left atrial size was mildly dilated.  5. Right atrial size was normal.  6. The mitral valve was not well visualized. Trace mitral valve regurgitation.  7. The tricuspid valve is not well visualized. Tricuspid valve regurgitation is mild.  8. The aortic valve was not well visualized. Aortic valve regurgitation is trivial.  9. The pulmonic valve was not assessed. Pulmonic valve regurgitation is not visualized. 10. The interatrial septum was not assessed.  FINDINGS  Left Ventricle: Left ventricular ejection fraction, by visual estimation, is 55 to 60%. The left ventricle has hyperdynamic function. Moderately increased left ventricular posterior wall thickness. There is moderately increased left ventricular  hypertrophy. Left ventricular diastolic parameters are consistent with Grade I diastolic dysfunction (impaired relaxation).  Right Ventricle: The right ventricular size is not  well visualized. Right vetricular wall thickness was not assessed. Global RV systolic function is was not well visualized.  Left Atrium: Left atrial size was mildly dilated.  Right Atrium: Right atrial size was normal in size  Pericardium: There is no evidence of pericardial effusion.  Mitral Valve: The mitral valve was not well visualized. Trace mitral valve regurgitation.  Tricuspid Valve: The tricuspid valve is not well visualized. Tricuspid valve regurgitation is mild.  Aortic Valve: The aortic valve was not well visualized. Aortic valve regurgitation is trivial. Aortic valve mean gradient measures 4.5 mmHg. Aortic valve peak gradient measures 7.5 mmHg. Aortic valve area, by VTI measures 2.27 cm.  Pulmonic Valve: The pulmonic valve was not assessed. Pulmonic valve regurgitation is not visualized.  Aorta: The aortic root is normal in size and structure.  IAS/Shunts: The  interatrial septum was not assessed.     LEFT VENTRICLE PLAX 2D LVIDd:         4.52 cm  Diastology LVIDs:         3.30 cm  LV e' lateral:   11.10 cm/s LV PW:         1.23 cm  LV E/e' lateral: 7.2 LV IVS:        1.22 cm  LV e' medial:    6.85 cm/s LVOT diam:     2.10 cm  LV E/e' medial:  11.6 LV SV:         49 ml LV SV Index:   21.17 LVOT Area:     3.46 cm    RIGHT VENTRICLE RV S prime:     18.80 cm/s  LEFT ATRIUM             Index LA diam:        2.90 cm 1.30 cm/m LA Vol (A2C):   50.4 ml 22.57 ml/m LA Vol (A4C):   45.6 ml 20.42 ml/m LA Biplane Vol: 47.8 ml 21.40 ml/m  AORTIC VALVE AV Area (Vmax):    2.51 cm AV Area (Vmean):   2.46 cm AV Area (VTI):     2.27 cm AV Vmax:           136.50 cm/s AV Vmean:          98.200 cm/s AV VTI:            0.174 m AV Peak Grad:      7.5 mmHg AV Mean Grad:      4.5 mmHg LVOT Vmax:         99.00 cm/s LVOT Vmean:        69.700 cm/s LVOT VTI:          0.114 m LVOT/AV VTI ratio: 0.66   AORTA Ao Root diam: 3.50 cm  MITRAL VALVE MV Area (PHT): 7.99 cm            SHUNTS MV PHT:        27.55 msec          Systemic VTI:  0.11 m MV Decel Time: 95 msec             Systemic Diam: 2.10 cm MV E velocity: 79.55 cm/s 103 cm/s  Disposition: Home  Consults: Thoracic Surgery  Discharge Instructions; follow up here and with Dr. Ubaldo Glassing (cardiology)  Disease/illness Education: Given today in writing  Home Health/Community Services Discussions/Referrals:  N/A  Establishment or re-establishment of referral orders for community resources:  N/A  Discussion with other health care providers: None  Assessment and Support of treatment  regimen adherence:  Appointments Coordinated with:  Patient   Education for self-management, independent living, and ADLs: Discussed today  Since getting out of the hospital, Teofil has been in a large amount of pain. His blood pressure has been running very high. Given his RPR in the hospital, he was  started on amiodarone and his metoprolol dose was decreased from 151m daily to 582mdaily. He has been very sore. He has been trying to watch his BP at home. He notes that he was taken off some of his medicines. He is due to follow up with his surgeon next week. No other concerns or complaints at this time.   Relevant past medical, surgical, family and social history reviewed and updated as indicated. Interim medical history since our last visit reviewed. Allergies and medications reviewed and updated.  Review of Systems  Constitutional: Negative.   HENT: Negative.   Respiratory: Negative.   Cardiovascular: Positive for chest pain. Negative for palpitations and leg swelling.  Psychiatric/Behavioral: Negative.     Per HPI unless specifically indicated above     Objective:    BP (!) 141/78   Pulse 77   SpO2 97%   Wt Readings from Last 3 Encounters:  01/25/19 222 lb 8 oz (100.9 kg)  01/08/19 234 lb 8 oz (106.4 kg)  12/25/18 231 lb 12.8 oz (105.1 kg)    Physical Exam Vitals signs and nursing note reviewed.  Constitutional:      General: He is not in acute distress.    Appearance: Normal appearance. He is not ill-appearing, toxic-appearing or diaphoretic.  HENT:     Head: Normocephalic and atraumatic.     Right Ear: External ear normal.     Left Ear: External ear normal.     Nose: Nose normal.     Mouth/Throat:     Mouth: Mucous membranes are moist.     Pharynx: Oropharynx is clear.  Eyes:     General: No scleral icterus.       Right eye: No discharge.        Left eye: No discharge.     Extraocular Movements: Extraocular movements intact.     Conjunctiva/sclera: Conjunctivae normal.     Pupils: Pupils are equal, round, and reactive to light.  Neck:     Musculoskeletal: Normal range of motion and neck supple.  Cardiovascular:     Rate and Rhythm: Normal rate and regular rhythm.     Pulses: Normal pulses.     Heart sounds: Normal heart sounds. No murmur. No friction rub.  No gallop.   Pulmonary:     Effort: Pulmonary effort is normal. No respiratory distress.     Breath sounds: Normal breath sounds. No stridor. No wheezing, rhonchi or rales.  Chest:     Chest wall: No tenderness.  Musculoskeletal: Normal range of motion.  Skin:    General: Skin is warm and dry.     Capillary Refill: Capillary refill takes less than 2 seconds.     Coloration: Skin is not jaundiced or pale.     Findings: No bruising, erythema, lesion or rash.  Neurological:     General: No focal deficit present.     Mental Status: He is alert and oriented to person, place, and time. Mental status is at baseline.  Psychiatric:        Mood and Affect: Mood normal.        Behavior: Behavior normal.        Thought Content: Thought content  normal.        Judgment: Judgment normal.     Results for orders placed or performed during the hospital encounter of 01/14/19  MRSA PCR Screening   Specimen: Nasal Mucosa; Nasopharyngeal  Result Value Ref Range   MRSA by PCR NEGATIVE NEGATIVE  Glucose, capillary  Result Value Ref Range   Glucose-Capillary 109 (H) 70 - 99 mg/dL  Glucose, capillary  Result Value Ref Range   Glucose-Capillary 154 (H) 70 - 99 mg/dL  CBC  Result Value Ref Range   WBC 17.4 (H) 4.0 - 10.5 K/uL   RBC 4.69 4.22 - 5.81 MIL/uL   Hemoglobin 13.7 13.0 - 17.0 g/dL   HCT 41.4 39.0 - 52.0 %   MCV 88.3 80.0 - 100.0 fL   MCH 29.2 26.0 - 34.0 pg   MCHC 33.1 30.0 - 36.0 g/dL   RDW 13.0 11.5 - 15.5 %   Platelets 218 150 - 400 K/uL   nRBC 0.0 0.0 - 0.2 %  Basic metabolic panel  Result Value Ref Range   Sodium 137 135 - 145 mmol/L   Potassium 4.4 3.5 - 5.1 mmol/L   Chloride 102 98 - 111 mmol/L   CO2 25 22 - 32 mmol/L   Glucose, Bld 217 (H) 70 - 99 mg/dL   BUN 14 6 - 20 mg/dL   Creatinine, Ser 0.97 0.61 - 1.24 mg/dL   Calcium 8.4 (L) 8.9 - 10.3 mg/dL   GFR calc non Af Amer >60 >60 mL/min   GFR calc Af Amer >60 >60 mL/min   Anion gap 10 5 - 15  Glucose, capillary   Result Value Ref Range   Glucose-Capillary 193 (H) 70 - 99 mg/dL  CBC with Differential/Platelet  Result Value Ref Range   WBC 13.7 (H) 4.0 - 10.5 K/uL   RBC 4.47 4.22 - 5.81 MIL/uL   Hemoglobin 13.2 13.0 - 17.0 g/dL   HCT 39.7 39.0 - 52.0 %   MCV 88.8 80.0 - 100.0 fL   MCH 29.5 26.0 - 34.0 pg   MCHC 33.2 30.0 - 36.0 g/dL   RDW 13.1 11.5 - 15.5 %   Platelets 203 150 - 400 K/uL   nRBC 0.0 0.0 - 0.2 %   Neutrophils Relative % 85 %   Neutro Abs 11.6 (H) 1.7 - 7.7 K/uL   Lymphocytes Relative 9 %   Lymphs Abs 1.3 0.7 - 4.0 K/uL   Monocytes Relative 5 %   Monocytes Absolute 0.7 0.1 - 1.0 K/uL   Eosinophils Relative 0 %   Eosinophils Absolute 0.0 0.0 - 0.5 K/uL   Basophils Relative 0 %   Basophils Absolute 0.0 0.0 - 0.1 K/uL   Immature Granulocytes 1 %   Abs Immature Granulocytes 0.09 (H) 0.00 - 0.07 K/uL  Basic metabolic panel  Result Value Ref Range   Sodium 137 135 - 145 mmol/L   Potassium 4.3 3.5 - 5.1 mmol/L   Chloride 102 98 - 111 mmol/L   CO2 26 22 - 32 mmol/L   Glucose, Bld 193 (H) 70 - 99 mg/dL   BUN 14 6 - 20 mg/dL   Creatinine, Ser 0.97 0.61 - 1.24 mg/dL   Calcium 8.3 (L) 8.9 - 10.3 mg/dL   GFR calc non Af Amer >60 >60 mL/min   GFR calc Af Amer >60 >60 mL/min   Anion gap 9 5 - 15  Magnesium  Result Value Ref Range   Magnesium 1.9 1.7 - 2.4 mg/dL  Glucose, capillary  Result Value Ref Range   Glucose-Capillary 171 (H) 70 - 99 mg/dL  Glucose, capillary  Result Value Ref Range   Glucose-Capillary 189 (H) 70 - 99 mg/dL  Glucose, capillary  Result Value Ref Range   Glucose-Capillary 170 (H) 70 - 99 mg/dL  Glucose, capillary  Result Value Ref Range   Glucose-Capillary 152 (H) 70 - 99 mg/dL  Hemoglobin A1c  Result Value Ref Range   Hgb A1c MFr Bld 7.4 (H) 4.8 - 5.6 %   Mean Plasma Glucose 165.68 mg/dL  Glucose, capillary  Result Value Ref Range   Glucose-Capillary 160 (H) 70 - 99 mg/dL  Hemoglobin A1c  Result Value Ref Range   Hgb A1c MFr Bld 7.5 (H) 4.8 -  5.6 %   Mean Plasma Glucose 168.55 mg/dL  Glucose, capillary  Result Value Ref Range   Glucose-Capillary 138 (H) 70 - 99 mg/dL   Comment 1 Notify RN   CBC with Differential/Platelet  Result Value Ref Range   WBC 17.6 (H) 4.0 - 10.5 K/uL   RBC 5.10 4.22 - 5.81 MIL/uL   Hemoglobin 14.7 13.0 - 17.0 g/dL   HCT 46.4 39.0 - 52.0 %   MCV 91.0 80.0 - 100.0 fL   MCH 28.8 26.0 - 34.0 pg   MCHC 31.7 30.0 - 36.0 g/dL   RDW 13.4 11.5 - 15.5 %   Platelets 252 150 - 400 K/uL   nRBC 0.0 0.0 - 0.2 %   Neutrophils Relative % 69 %   Neutro Abs 12.3 (H) 1.7 - 7.7 K/uL   Lymphocytes Relative 21 %   Lymphs Abs 3.6 0.7 - 4.0 K/uL   Monocytes Relative 9 %   Monocytes Absolute 1.6 (H) 0.1 - 1.0 K/uL   Eosinophils Relative 0 %   Eosinophils Absolute 0.0 0.0 - 0.5 K/uL   Basophils Relative 0 %   Basophils Absolute 0.0 0.0 - 0.1 K/uL   Immature Granulocytes 1 %   Abs Immature Granulocytes 0.09 (H) 0.00 - 0.07 K/uL  Basic metabolic panel  Result Value Ref Range   Sodium 138 135 - 145 mmol/L   Potassium 4.2 3.5 - 5.1 mmol/L   Chloride 100 98 - 111 mmol/L   CO2 29 22 - 32 mmol/L   Glucose, Bld 156 (H) 70 - 99 mg/dL   BUN 15 6 - 20 mg/dL   Creatinine, Ser 0.94 0.61 - 1.24 mg/dL   Calcium 8.9 8.9 - 10.3 mg/dL   GFR calc non Af Amer >60 >60 mL/min   GFR calc Af Amer >60 >60 mL/min   Anion gap 9 5 - 15  Brain natriuretic peptide  Result Value Ref Range   B Natriuretic Peptide 69.0 0.0 - 100.0 pg/mL  Hepatic function panel  Result Value Ref Range   Total Protein 7.9 6.5 - 8.1 g/dL   Albumin 4.3 3.5 - 5.0 g/dL   AST 54 (H) 15 - 41 U/L   ALT 59 (H) 0 - 44 U/L   Alkaline Phosphatase 103 38 - 126 U/L   Total Bilirubin 1.2 0.3 - 1.2 mg/dL   Bilirubin, Direct 0.2 0.0 - 0.2 mg/dL   Indirect Bilirubin 1.0 (H) 0.3 - 0.9 mg/dL  Magnesium  Result Value Ref Range   Magnesium 2.1 1.7 - 2.4 mg/dL  Phosphorus  Result Value Ref Range   Phosphorus 3.2 2.5 - 4.6 mg/dL  Lactic acid, plasma  Result Value Ref  Range   Lactic Acid, Venous 2.1 (HH) 0.5 - 1.9 mmol/L  Procalcitonin - Baseline  Result Value Ref Range   Procalcitonin 0.32 [name]/mL  Glucose, capillary  Result Value Ref Range   Glucose-Capillary 161 (H) 70 - 99 mg/dL  Lactic acid, plasma  Result Value Ref Range   Lactic Acid, Venous 1.7 0.5 - 1.9 mmol/L  Glucose, capillary  Result Value Ref Range   Glucose-Capillary 145 (H) 70 - 99 mg/dL  Procalcitonin  Result Value Ref Range   Procalcitonin 0.25 [name]/mL  Glucose, capillary  Result Value Ref Range   Glucose-Capillary 171 (H) 70 - 99 mg/dL  Glucose, capillary  Result Value Ref Range   Glucose-Capillary 139 (H) 70 - 99 mg/dL  am cbc  Result Value Ref Range   WBC 15.1 (H) 4.0 - 10.5 K/uL   RBC 4.67 4.22 - 5.81 MIL/uL   Hemoglobin 13.6 13.0 - 17.0 g/dL   HCT 43.2 39.0 - 52.0 %   MCV 92.5 80.0 - 100.0 fL   MCH 29.1 26.0 - 34.0 pg   MCHC 31.5 30.0 - 36.0 g/dL   RDW 13.5 11.5 - 15.5 %   Platelets 251 150 - 400 K/uL   nRBC 0.0 0.0 - 0.2 %  Comprehensive metabolic panel  Result Value Ref Range   Sodium 135 135 - 145 mmol/L   Potassium 3.7 3.5 - 5.1 mmol/L   Chloride 99 98 - 111 mmol/L   CO2 26 22 - 32 mmol/L   Glucose, Bld 176 (H) 70 - 99 mg/dL   BUN 25 (H) 6 - 20 mg/dL   Creatinine, Ser 0.87 0.61 - 1.24 mg/dL   Calcium 8.6 (L) 8.9 - 10.3 mg/dL   Total Protein 7.2 6.5 - 8.1 g/dL   Albumin 3.7 3.5 - 5.0 g/dL   AST 31 15 - 41 U/L   ALT 43 0 - 44 U/L   Alkaline Phosphatase 92 38 - 126 U/L   Total Bilirubin 1.1 0.3 - 1.2 mg/dL   GFR calc non Af Amer >60 >60 mL/min   GFR calc Af Amer >60 >60 mL/min   Anion gap 10 5 - 15  Magnesium  Result Value Ref Range   Magnesium 2.5 (H) 1.7 - 2.4 mg/dL  Phosphorus  Result Value Ref Range   Phosphorus 3.1 2.5 - 4.6 mg/dL  Glucose, capillary  Result Value Ref Range   Glucose-Capillary 169 (H) 70 - 99 mg/dL  Glucose, capillary  Result Value Ref Range   Glucose-Capillary 206 (H) 70 - 99 mg/dL  Glucose, capillary  Result Value Ref  Range   Glucose-Capillary 125 (H) 70 - 99 mg/dL  Procalcitonin  Result Value Ref Range   Procalcitonin 0.10 [name]/mL  am cbc  Result Value Ref Range   WBC 13.0 (H) 4.0 - 10.5 K/uL   RBC 4.59 4.22 - 5.81 MIL/uL   Hemoglobin 13.5 13.0 - 17.0 g/dL   HCT 40.9 39.0 - 52.0 %   MCV 89.1 80.0 - 100.0 fL   MCH 29.4 26.0 - 34.0 pg   MCHC 33.0 30.0 - 36.0 g/dL   RDW 13.2 11.5 - 15.5 %   Platelets 257 150 - 400 K/uL   nRBC 0.0 0.0 - 0.2 %  Glucose, capillary  Result Value Ref Range   Glucose-Capillary 139 (H) 70 - 99 mg/dL  Glucose, capillary  Result Value Ref Range   Glucose-Capillary 136 (H) 70 - 99 mg/dL  Glucose, capillary  Result Value Ref Range   Glucose-Capillary 111 (H) 70 - 99 mg/dL  Glucose, capillary  Result Value Ref Range  Glucose-Capillary 169 (H) 70 - 99 mg/dL  Basic metabolic panel  Result Value Ref Range   Sodium 134 (L) 135 - 145 mmol/L   Potassium 3.4 (L) 3.5 - 5.1 mmol/L   Chloride 100 98 - 111 mmol/L   CO2 27 22 - 32 mmol/L   Glucose, Bld 132 (H) 70 - 99 mg/dL   BUN 23 (H) 6 - 20 mg/dL   Creatinine, Ser 0.97 0.61 - 1.24 mg/dL   Calcium 8.3 (L) 8.9 - 10.3 mg/dL   GFR calc non Af Amer >60 >60 mL/min   GFR calc Af Amer >60 >60 mL/min   Anion gap 7 5 - 15  Glucose, capillary  Result Value Ref Range   Glucose-Capillary 120 (H) 70 - 99 mg/dL  Glucose, capillary  Result Value Ref Range   Glucose-Capillary 140 (H) 70 - 99 mg/dL   Comment 1 Notify RN    Comment 2 Document in Chart   Glucose, capillary  Result Value Ref Range   Glucose-Capillary 92 70 - 99 mg/dL   Comment 1 Notify RN    Comment 2 Document in Chart   Basic metabolic panel  Result Value Ref Range   Sodium 137 135 - 145 mmol/L   Potassium 3.5 3.5 - 5.1 mmol/L   Chloride 102 98 - 111 mmol/L   CO2 27 22 - 32 mmol/L   Glucose, Bld 138 (H) 70 - 99 mg/dL   BUN 19 6 - 20 mg/dL   Creatinine, Ser 0.85 0.61 - 1.24 mg/dL   Calcium 8.8 (L) 8.9 - 10.3 mg/dL   GFR calc non Af Amer >60 >60 mL/min   GFR  calc Af Amer >60 >60 mL/min   Anion gap 8 5 - 15  Magnesium  Result Value Ref Range   Magnesium 1.9 1.7 - 2.4 mg/dL  Glucose, capillary  Result Value Ref Range   Glucose-Capillary 117 (H) 70 - 99 mg/dL  Glucose, capillary  Result Value Ref Range   Glucose-Capillary 114 (H) 70 - 99 mg/dL  Glucose, capillary  Result Value Ref Range   Glucose-Capillary 152 (H) 70 - 99 mg/dL  Glucose, capillary  Result Value Ref Range   Glucose-Capillary 123 (H) 70 - 99 mg/dL  Glucose, capillary  Result Value Ref Range   Glucose-Capillary 125 (H) 70 - 99 mg/dL  Renal function panel  Result Value Ref Range   Sodium 138 135 - 145 mmol/L   Potassium 4.0 3.5 - 5.1 mmol/L   Chloride 99 98 - 111 mmol/L   CO2 26 22 - 32 mmol/L   Glucose, Bld 159 (H) 70 - 99 mg/dL   BUN 18 6 - 20 mg/dL   Creatinine, Ser 0.99 0.61 - 1.24 mg/dL   Calcium 9.0 8.9 - 10.3 mg/dL   Phosphorus 3.2 2.5 - 4.6 mg/dL   Albumin 3.5 3.5 - 5.0 g/dL   GFR calc non Af Amer >60 >60 mL/min   GFR calc Af Amer >60 >60 mL/min   Anion gap 13 5 - 15  Glucose, capillary  Result Value Ref Range   Glucose-Capillary 114 (H) 70 - 99 mg/dL  Glucose, capillary  Result Value Ref Range   Glucose-Capillary 134 (H) 70 - 99 mg/dL  Magnesium  Result Value Ref Range   Magnesium 2.0 1.7 - 2.4 mg/dL  Glucose, capillary  Result Value Ref Range   Glucose-Capillary 151 (H) 70 - 99 mg/dL  Glucose, capillary  Result Value Ref Range   Glucose-Capillary 155 (H) 70 - 99  mg/dL  Glucose, capillary  Result Value Ref Range   Glucose-Capillary 139 (H) 70 - 99 mg/dL  Basic metabolic panel  Result Value Ref Range   Sodium 137 135 - 145 mmol/L   Potassium 3.4 (L) 3.5 - 5.1 mmol/L   Chloride 97 (L) 98 - 111 mmol/L   CO2 29 22 - 32 mmol/L   Glucose, Bld 127 (H) 70 - 99 mg/dL   BUN 19 6 - 20 mg/dL   Creatinine, Ser 1.03 0.61 - 1.24 mg/dL   Calcium 8.6 (L) 8.9 - 10.3 mg/dL   GFR calc non Af Amer >60 >60 mL/min   GFR calc Af Amer >60 >60 mL/min   Anion gap  11 5 - 15  Magnesium  Result Value Ref Range   Magnesium 2.2 1.7 - 2.4 mg/dL  Glucose, capillary  Result Value Ref Range   Glucose-Capillary 123 (H) 70 - 99 mg/dL  Glucose, capillary  Result Value Ref Range   Glucose-Capillary 141 (H) 70 - 99 mg/dL  CBC  Result Value Ref Range   WBC 11.6 (H) 4.0 - 10.5 K/uL   RBC 4.65 4.22 - 5.81 MIL/uL   Hemoglobin 13.6 13.0 - 17.0 g/dL   HCT 41.5 39.0 - 52.0 %   MCV 89.2 80.0 - 100.0 fL   MCH 29.2 26.0 - 34.0 pg   MCHC 32.8 30.0 - 36.0 g/dL   RDW 13.2 11.5 - 15.5 %   Platelets 317 150 - 400 K/uL   nRBC 0.0 0.0 - 0.2 %  Protime-INR  Result Value Ref Range   Prothrombin Time 14.8 11.4 - 15.2 seconds   INR 1.2 0.8 - 1.2  APTT  Result Value Ref Range   aPTT 28 24 - 36 seconds  Glucose, capillary  Result Value Ref Range   Glucose-Capillary 131 (H) 70 - 99 mg/dL  Glucose, capillary  Result Value Ref Range   Glucose-Capillary 247 (H) 70 - 99 mg/dL  Glucose, capillary  Result Value Ref Range   Glucose-Capillary 102 (H) 70 - 99 mg/dL  Basic metabolic panel  Result Value Ref Range   Sodium 137 135 - 145 mmol/L   Potassium 3.9 3.5 - 5.1 mmol/L   Chloride 98 98 - 111 mmol/L   CO2 28 22 - 32 mmol/L   Glucose, Bld 136 (H) 70 - 99 mg/dL   BUN 22 (H) 6 - 20 mg/dL   Creatinine, Ser 0.95 0.61 - 1.24 mg/dL   Calcium 8.6 (L) 8.9 - 10.3 mg/dL   GFR calc non Af Amer >60 >60 mL/min   GFR calc Af Amer >60 >60 mL/min   Anion gap 11 5 - 15  Glucose, capillary  Result Value Ref Range   Glucose-Capillary 106 (H) 70 - 99 mg/dL  Magnesium  Result Value Ref Range   Magnesium 2.3 1.7 - 2.4 mg/dL  Phosphorus  Result Value Ref Range   Phosphorus 3.5 2.5 - 4.6 mg/dL  Glucose, capillary  Result Value Ref Range   Glucose-Capillary 130 (H) 70 - 99 mg/dL  Glucose, capillary  Result Value Ref Range   Glucose-Capillary 201 (H) 70 - 99 mg/dL  Glucose, capillary  Result Value Ref Range   Glucose-Capillary 107 (H) 70 - 99 mg/dL   Comment 1 Notify RN     Comment 2 Document in Chart   Basic metabolic panel  Result Value Ref Range   Sodium 137 135 - 145 mmol/L   Potassium 3.4 (L) 3.5 - 5.1 mmol/L   Chloride 98  98 - 111 mmol/L   CO2 27 22 - 32 mmol/L   Glucose, Bld 130 (H) 70 - 99 mg/dL   BUN 19 6 - 20 mg/dL   Creatinine, Ser 1.03 0.61 - 1.24 mg/dL   Calcium 9.0 8.9 - 10.3 mg/dL   GFR calc non Af Amer >60 >60 mL/min   GFR calc Af Amer >60 >60 mL/min   Anion gap 12 5 - 15  Magnesium  Result Value Ref Range   Magnesium 2.4 1.7 - 2.4 mg/dL  Glucose, capillary  Result Value Ref Range   Glucose-Capillary 143 (H) 70 - 99 mg/dL  Glucose, capillary  Result Value Ref Range   Glucose-Capillary 145 (H) 70 - 99 mg/dL  Glucose, capillary  Result Value Ref Range   Glucose-Capillary 124 (H) 70 - 99 mg/dL  Basic metabolic panel  Result Value Ref Range   Sodium 138 135 - 145 mmol/L   Potassium 3.8 3.5 - 5.1 mmol/L   Chloride 102 98 - 111 mmol/L   CO2 26 22 - 32 mmol/L   Glucose, Bld 136 (H) 70 - 99 mg/dL   BUN 17 6 - 20 mg/dL   Creatinine, Ser 1.07 0.61 - 1.24 mg/dL   Calcium 8.9 8.9 - 10.3 mg/dL   GFR calc non Af Amer >60 >60 mL/min   GFR calc Af Amer >60 >60 mL/min   Anion gap 10 5 - 15  Magnesium  Result Value Ref Range   Magnesium 2.4 1.7 - 2.4 mg/dL  Glucose, capillary  Result Value Ref Range   Glucose-Capillary 127 (H) 70 - 99 mg/dL  Glucose, capillary  Result Value Ref Range   Glucose-Capillary 152 (H) 70 - 99 mg/dL  Glucose, capillary  Result Value Ref Range   Glucose-Capillary 115 (H) 70 - 99 mg/dL  ECHOCARDIOGRAM COMPLETE  Result Value Ref Range   Weight 3,752.02 oz   Height 70 in   BP 133/89 mmHg  Prepare RBC (crossmatch)  Result Value Ref Range   Order Confirmation      ORDER PROCESSED BY BLOOD BANK Performed at [facility], [address] [place], [place] [address]   Type and screen Canones  Result Value Ref Range   ABO/RH(D) A POS    Antibody Screen NEG    Sample  Expiration      01/17/2019,2359 Performed at [facility], [address], [place], [place] [address]    Unit Number [id number]    Blood Component Type RED CELLS,LR    Unit division 00    Status of Unit REL FROM [facility]    Transfusion Status OK TO TRANSFUSE    Crossmatch Result Compatible    Unit Number [id number]    Blood Component Type RBC LR PHER1    Unit division 00    Status of Unit REL FROM [facility]    Transfusion Status OK TO TRANSFUSE    Crossmatch Result Compatible   Surgical pathology  Result Value Ref Range   SURGICAL PATHOLOGY      SURGICAL PATHOLOGY CASE: [id number] PATIENT: Terrilyn Saver Surgical Pathology Report     Specimen Submitted: A. Lung, left lower lobe B. L6 (left para-aorta) node C. L9 left lung lobe, pulmonary ligament D. L10 left lung lobe, hilar E. L10 left lung lobe, hilar  Clinical History: Lung mass    DIAGNOSIS: A. LUNG, LEFT LOWER LOBE; LOBECTOMY: - INVASIVE SQUAMOUS CELL CARCINOMA, KERATINIZING. - FOUR LYMPH NODES, NEGATIVE FOR MALIGNANCY (0/4). - SEE CANCER SUMMARY.  B. LYMPH NODE, STATION 6 (LEFT PARA-AORTIC); EXCISION: - ONE LYMPH NODE, NEGATIVE FOR MALIGNANCY (0/1).  C. LYMPH NODE, STATION 9 (LEFT PULMONARY LIGAMENT); EXCISION: - TWO LYMPH NODES, NEGATIVE FOR MALIGNANCY (0/2).  D. LYMPH NODE, STATION 10 (LEFT HILAR); EXCISION: - ONE LYMPH NODE, NEGATIVE FOR MALIGNANCY (0/1).  E. LYMPH NODE, STATION 10 (LEFT HILAR); EXCISION: - BENIGN FIBROFATTY TISSUE. - NO LYMPH NODE IDENTIFIED.  CANCER CASE SUMMARY: LUNG Procedure: Lobectomy Specimen Lateralit y: Left Tumor Site: Lower lobe Tumor Size:                      Total tumor size: Greatest dimension: 1.5 cm Tumor Focality: Single focus Histologic Type: Invasive squamous cell carcinoma, keratinizing Visceral Pleura Invasion: Not identified Lymphovascular Invasion: Not identified Direct Invasion of Adjacent Structures: No adjacent structures present  Margins: All margins are uninvolved by tumor Regional Lymph Nodes: Number of Lymph Nodes Involved: 0 Number of Lymph Nodes Examined: 8 Specify nodal stations examined: Stations L6, L9, and L10  Treatment Effect: No known presurgical therapy Pathologic Stage Classification (pTNM, AJCC 8th Edition): pT1b pN0 TNM Descriptors:  GROSS DESCRIPTION: A. Labeled: Left lower lobe lung Received: In formalin Type of procedure: Lung lobectomy Laterality: Left Weight of specimen: 202 grams Size of specimen: 17 x 13 x 5.5 cm Other attached structures: Not identified Specimen integrity: Intact Orientation: Unoriented Number of  masses: 1 Size(s) of mass(es): 2.3 x 1.2 x 1.0 cm Location of mass(es): Left low lung lobe Description of mass(es): White irregular solid mass with central cavitation (biopsy site containing jelly material) Relationship of mass(es) to bronchus: Grossly tumor arises from lung parenchyma.  There is not grossly evidence affiliation with bronchial tree Margins: Bronchial margin: 3.0 cm Parenchymal hilar margin: 3.5 cm Vascular margin: 2.0 cm Relationship / distance of mass(es) to pleura: 0.5 cm Extension of mass(es): Not identified Description of remainder of lung: Red spongy lung parenchyma without visible or palpable additional abnormality Lymph nodes: For possible black soft peribronchial lymph nodes identified ranging from 0.3 to 0.4 cm in greatest dimension  Block summary: 1-bronchial margin 2-vascular margin 3-6-entirely submitted tumor 7-8-lower lobe hilum with distal vascular and bronchial structure (not margin) 9-10-peripheral lung parenchyma 11-14 candi dates for lymph nodes   B. Labeled: L6 left para-aortic lymph node Received: In formalin Tissue fragment(s): 1 Size: 0.9 x 0.6 x 0.4 cm Description: Black soft single lymph node Entirely submitted in 1 cassette.  C. Labeled: L9 left lung pulmonary lymph node Received: In formalin Tissue  fragment(s): 1 Size: 0.5 x 0.4 x 0.3 cm Description: Black soft lymph node Entirely submitted in 1 cassette.  D. Labeled: L 10 hilar left lung lobe Received: In formalin Tissue fragment(s): 1 Size: 0.4 x 0.3 x 0.2 cm Description: Black soft lymph node Entirely submitted in 1 cassette.  E. Labeled: L 10 hilar left lung lobe Received: In formalin Tissue fragment(s): 1 Size: 0.5 x 0.3 x 0.2 cm Description: Fibrofatty soft tissue fragment Entirely submitted in 1 cassette.     Final Diagnosis performed by Allena Napoleon, MD.   Electronically signed 01/18/2019 1:16:55PM The electronic signature indicates that the named Attending Pathologist has evaluated the specimen Technic al component performed at [facility], [address] [place], [place] [address] Lab: [phone] Dir: Rush Farmer, MD, MMM  Professional component performed at [facility], [facility], [place], [place], [place] [address] Lab: [phone] Dir: Dellia Nims. Rubinas, MD   BPAM RBC  Result Value Ref Range   Blood  Product Unit Number [id number]    PRODUCT CODE [id number]    Unit Type and Rh 5670    Blood Product Expiration Date 141030131438    Blood Product Unit Number [id number]    PRODUCT CODE R5615P79    Unit Type and Rh 6200    Blood Product Expiration Date 432761470929   Troponin I (High Sensitivity)  Result Value Ref Range   Troponin I (High Sensitivity) 19 (H) <18 [name]/L  Troponin I (High Sensitivity)  Result Value Ref Range   Troponin I (High Sensitivity) 21 (H) <18 [name]/L      Assessment & Plan:   Problem List Items Addressed This Visit      Cardiovascular and Mediastinum   Atrial fibrillation with RVR (HCC)    Due to follow up with cardiology next week. HR OK today. Continue current regimen. Continue to monitor. Call with any concerns.       Relevant Medications   amLODipine (NORVASC) 5 MG tablet     Genitourinary   Benign hypertensive renal disease    Not under good  control- will increase his amlodipine to 60m daily and recheck 1 week. Call with any concerns.         Other   Lung mass    [facility] Still in significant pain- will take his medicine as prescribed. Follow up with thoracic surgery as needed. Continue to monitor closely.           Follow up plan: Return in about 2 weeks (around 02/10/2019).   . This visit was completed via Doximity due to the restrictions of the COVID-19 pandemic. All issues as above were discussed and addressed. Physical exam was done as above through visual confirmation on Doximity. If it was felt that the patient should be evaluated in the office, they were directed there. The patient verbally consented to this visit. . Location of the patient: home . Location of the provider: home . Those involved with this call:  . Provider: MPark Liter DO . CMA: Tiffany Reel, CMA . Front Desk/Registration: CDon Perking . Time spent on call: 25 minutes with patient face to face via video conference. More than 50% of this time was spent in counseling and coordination of care. 40 minutes total spent in review of patient's record and preparation of their chart.

## 2019-02-01 ENCOUNTER — Other Ambulatory Visit: Payer: Self-pay | Admitting: Cardiothoracic Surgery

## 2019-02-01 ENCOUNTER — Telehealth: Payer: Self-pay | Admitting: Pain Medicine

## 2019-02-01 ENCOUNTER — Encounter: Payer: Self-pay | Admitting: Family Medicine

## 2019-02-01 NOTE — Telephone Encounter (Signed)
lvmail stating the compounding place where he gets his pain cream will not fill script until they get all workers comp info.

## 2019-02-01 NOTE — Assessment & Plan Note (Signed)
Due to follow up with cardiology next week. HR OK today. Continue current regimen. Continue to monitor. Call with any concerns.

## 2019-02-01 NOTE — Assessment & Plan Note (Signed)
Not under good control- will increase his amlodipine to 10mg  daily and recheck 1 week. Call with any concerns.

## 2019-02-01 NOTE — Telephone Encounter (Signed)
Will document this in chart.

## 2019-02-01 NOTE — Assessment & Plan Note (Signed)
Removed last week at [facility]. Still in significant pain- will take his medicine as prescribed. Follow up with thoracic surgery as needed. Continue to monitor closely.

## 2019-02-01 NOTE — Telephone Encounter (Signed)
The compounding company needs his workers comp information so they can get this approved

## 2019-02-02 ENCOUNTER — Telehealth: Payer: Self-pay | Admitting: Pain Medicine

## 2019-02-02 ENCOUNTER — Ambulatory Visit: Payer: Self-pay | Admitting: *Deleted

## 2019-02-02 DIAGNOSIS — R918 Other nonspecific abnormal finding of lung field: Secondary | ICD-10-CM

## 2019-02-02 DIAGNOSIS — E1169 Type 2 diabetes mellitus with other specified complication: Secondary | ICD-10-CM | POA: Diagnosis not present

## 2019-02-02 DIAGNOSIS — Z09 Encounter for follow-up examination after completed treatment for conditions other than malignant neoplasm: Secondary | ICD-10-CM | POA: Diagnosis not present

## 2019-02-02 DIAGNOSIS — I700 Atherosclerosis of aorta: Secondary | ICD-10-CM | POA: Diagnosis not present

## 2019-02-02 DIAGNOSIS — E1122 Type 2 diabetes mellitus with diabetic chronic kidney disease: Secondary | ICD-10-CM | POA: Diagnosis not present

## 2019-02-02 DIAGNOSIS — Z86010 Personal history of colonic polyps: Secondary | ICD-10-CM

## 2019-02-02 DIAGNOSIS — I4891 Unspecified atrial fibrillation: Secondary | ICD-10-CM

## 2019-02-02 DIAGNOSIS — J9601 Acute respiratory failure with hypoxia: Secondary | ICD-10-CM

## 2019-02-02 DIAGNOSIS — R0602 Shortness of breath: Secondary | ICD-10-CM | POA: Insufficient documentation

## 2019-02-02 DIAGNOSIS — Z720 Tobacco use: Secondary | ICD-10-CM

## 2019-02-02 DIAGNOSIS — I129 Hypertensive chronic kidney disease with stage 1 through stage 4 chronic kidney disease, or unspecified chronic kidney disease: Secondary | ICD-10-CM

## 2019-02-02 DIAGNOSIS — I9789 Other postprocedural complications and disorders of the circulatory system, not elsewhere classified: Secondary | ICD-10-CM | POA: Insufficient documentation

## 2019-02-02 NOTE — Chronic Care Management (AMB) (Signed)
Chronic Care Management   Follow Up Note   02/02/2019 Name: Kristopher [name] MRN: [id number] DOB: [birth year]  Referred by: [name], DO Reason for referral : Chronic Care Management (RED EMMI post discharge )   Kristopher [name] is a 58 y.o. year old male who is a primary care patient of [name], DO. The CCM team was consulted for assistance with chronic disease management and care coordination needs.    Review of patient status, including review of consultants reports, relevant laboratory and other test results, and collaboration with appropriate care team members and the patient's provider was performed as part of comprehensive patient evaluation and provision of chronic care management services.    SDOH (Social Determinants of Health) screening performed today: Tobacco Use Stress Physical Activity. See Care Plan for related entries.   Outpatient Encounter Medications as of 02/02/2019  Medication Sig  . amiodarone (PACERONE) 200 MG tablet Take 1 tablet (200 mg total) by mouth 2 (two) times daily.  [name] amLODipine (NORVASC) 5 MG tablet Take 1 tablet (5 mg total) by mouth 2 (two) times daily.  [name] atorvastatin (LIPITOR) 10 MG tablet Take 1 tablet (10 mg total) by mouth daily at 6 PM.  . bisacodyl (DULCOLAX) 5 MG EC tablet Take 2 tablets (10 mg total) by mouth daily.  . hydrALAZINE (APRESOLINE) 100 MG tablet Take 1 tablet (100 mg total) by mouth 2 (two) times daily.  . metoprolol tartrate (LOPRESSOR) 50 MG tablet Take 1 tablet (50 mg total) by mouth 2 (two) times daily.  [name] oxyCODONE (OXY IR/ROXICODONE) 5 MG immediate release tablet Take 1 tablet (5 mg total) by mouth every 6 (six) hours as needed for moderate pain or severe pain.  . pantoprazole (PROTONIX) 40 MG tablet Take 1 tablet (40 mg total) by mouth daily.  . simethicone (MYLICON) 80 MG chewable tablet Chew 1 tablet (80 mg total) by mouth every 6 (six) hours as needed for flatulence.   Facility-Administered Encounter  Medications as of 02/02/2019  Medication  . 0.9 %  sodium chloride infusion     Goals Addressed            This Visit's Progress   . RNCM- I need to stay healthy for my granddaughter (pt-stated)       Current Barriers:  [name] Knowledge Deficits related to basic understanding of hypertension pathophysiology and self care management . Knowledge Deficits related to understanding of medications prescribed for management of hypertension . Patient continues to smoke 1.5 packs a day-reporting has almost completely quit . Exercise program limited by need of a new knee brace . New lung cancer diagnosis- 12/2018  Case Manager Clinical Goal(s):  [name] Over the next 90 days, patient will verbalize understanding of plan for hypertension management  Interventions:  . Evaluation of current treatment plan related to hypertension self management and patient's adherence to plan as established by provider. . Discussed plans with patient for ongoing care management follow up and provided patient with direct contact information for care management team . Reviewed scheduled/upcoming provider appointments including:  Oncology appt 12/4 to discuss cancer treatment plan and to remove staples . Placed a call to patient related to RED FLAG EMMI call from general discharge. Patient answered Yes to "other questions" Patient reports he has not been sleeping well since hospital d/c and wanted to ask if melatonin was ok for him to try. Patient reported he saw the cardiologist today and he was able to answer his question in regards  to this. Patient plans to try melatonin tonight to rest. . Patient reported some high blood pressures since coming home from hospital, stated he reported these to Cardiologist, he stated at home it had been as high as 220/160. He denies SOB. Patient stated cardiologist placed a Holter monitor on him.  . Patient reports he has been able to exercise/walk his long driveway . Discussed Tobacco cessation-  Patient reports 1 cigarette since d/c'd from hospital . Encouraged patient to reach out if he needed anything  Patient Self Care Activities:  . UNABLE to independently manage HTN as evidenced by reported headaches and high b/p numbers.  . Checks BP and records as discussed  Please see past updates related to this goal by clicking on the "Past Updates" button in the selected goal           The care management team will reach out to the patient again over the next 60 days.  The patient has been provided with contact information for the care management team and has been advised to call with any health related questions or concerns.    [name] Ople Girgis RN, BSN Nurse Case Editor, [facility]ing [facility]  [phone]) Business Mobile

## 2019-02-02 NOTE — Telephone Encounter (Signed)
Patient called stating he wants his pain cream sent back to the place it was going before IWP, He didn't have any problems with them. Somehow his script was sent to a new place and he still has not gotten his meds. Please Check with Dr. Dossie Arbour tomorrow and make sure the scripts get sent to [facility] ). Please call patient and let him know status. He says this cream works very well for his pain.

## 2019-02-02 NOTE — Patient Instructions (Signed)
Thank you allowing the Chronic Care Management Team to be a part of your care! It was a pleasure speaking with you today!  CCM (Chronic Care Management) Team   Chenille Toor RN, BSN Nurse Care Coordinator  [phone]  Catie [facility] PharmD  Clinical Pharmacist  [phone]  Eula Fried LCSW Clinical Social Worker [phone]  Goals Addressed            This Visit's Progress   . RNCM- I need to stay healthy for my granddaughter (pt-stated)       Current Barriers:  [name] Knowledge Deficits related to basic understanding of hypertension pathophysiology and self care management . Knowledge Deficits related to understanding of medications prescribed for management of hypertension . Patient continues to smoke 1.5 packs a day-reporting has almost completely quit . Exercise program limited by need of a new knee brace . New lung cancer diagnosis- 12/2018  Case Manager Clinical Goal(s):  [name] Over the next 90 days, patient will verbalize understanding of plan for hypertension management  Interventions:  . Evaluation of current treatment plan related to hypertension self management and patient's adherence to plan as established by provider. . Discussed plans with patient for ongoing care management follow up and provided patient with direct contact information for care management team . Reviewed scheduled/upcoming provider appointments including:  Oncology appt 12/4 to discuss cancer treatment plan and to remove staples . Placed a call to patient related to RED FLAG EMMI call from general discharge. Patient answered Yes to "other questions" Patient reports he has not been sleeping well since hospital d/c and wanted to ask if melatonin was ok for him to try. Patient reported he saw the cardiologist today and he was able to answer his question in regards to this. Patient plans to try melatonin tonight to rest. . Patient reported some high blood pressures since coming home from hospital,  stated he reported these to Cardiologist, he stated at home it had been as high as 220/160. He denies SOB. Patient stated cardiologist placed a Holter monitor on him.  . Patient reports he has been able to exercise/walk his long driveway . Discussed Tobacco cessation- Patient reports 1 cigarette since d/c'd from hospital . Encouraged patient to reach out if he needed anything  Patient Self Care Activities:  . UNABLE to independently manage HTN as evidenced by reported headaches and high b/p numbers.  . Checks BP and records as discussed  Please see past updates related to this goal by clicking on the "Past Updates" button in the selected goal          The patient verbalized understanding of instructions provided today and declined a print copy of patient instruction materials.   The patient has been provided with contact information for the care management team and has been advised to call with any health related questions or concerns.

## 2019-02-03 ENCOUNTER — Telehealth: Payer: Self-pay

## 2019-02-03 NOTE — Telephone Encounter (Signed)
Please clarify- you state IWP- what is that? And you state he does and does not want to use them. Which one?

## 2019-02-03 NOTE — Telephone Encounter (Signed)
Entry in error. 02/03/19

## 2019-02-03 NOTE — Telephone Encounter (Deleted)
Made in error

## 2019-02-04 ENCOUNTER — Other Ambulatory Visit: Payer: Self-pay

## 2019-02-04 ENCOUNTER — Other Ambulatory Visit: Payer: Self-pay | Admitting: Pain Medicine

## 2019-02-04 ENCOUNTER — Ambulatory Visit
Admission: RE | Admit: 2019-02-04 | Discharge: 2019-02-04 | Disposition: A | Discharge status: Home / Self Care | Payer: Medicare Other | Attending: Cardiothoracic Surgery | Admitting: Cardiothoracic Surgery

## 2019-02-04 ENCOUNTER — Ambulatory Visit
Admission: RE | Admit: 2019-02-04 | Discharge: 2019-02-04 | Disposition: A | Discharge status: Home / Self Care | Payer: Medicare Other | Source: Ambulatory Visit | Attending: Cardiothoracic Surgery | Admitting: Cardiothoracic Surgery

## 2019-02-04 DIAGNOSIS — G894 Chronic pain syndrome: Secondary | ICD-10-CM

## 2019-02-04 DIAGNOSIS — R911 Solitary pulmonary nodule: Secondary | ICD-10-CM

## 2019-02-04 DIAGNOSIS — R079 Chest pain, unspecified: Secondary | ICD-10-CM | POA: Diagnosis not present

## 2019-02-04 MED ORDER — NONFORMULARY OR COMPOUNDED ITEM: 5 refills | Status: DC

## 2019-02-04 NOTE — Telephone Encounter (Signed)
Script reprinted per Dr. Dossie Arbour and I faxed it to [facility] on 02/04/19.     Anderson Malta

## 2019-02-04 NOTE — Telephone Encounter (Signed)
Patient says his scripts got sent to the wrong place. He wants them sent to [facility]. I placed a script request from them at nurses desk  Tues. 02-02-19

## 2019-02-05 ENCOUNTER — Ambulatory Visit (INDEPENDENT_AMBULATORY_CARE_PROVIDER_SITE_OTHER): Payer: Medicare Other | Admitting: Cardiothoracic Surgery

## 2019-02-05 ENCOUNTER — Encounter: Payer: Self-pay | Admitting: Cardiothoracic Surgery

## 2019-02-05 VITALS — BP 158/98 | HR 73 | Temp 97.7°F | Ht 70.0 in | Wt 221.4 lb

## 2019-02-05 DIAGNOSIS — C3490 Malignant neoplasm of unspecified part of unspecified bronchus or lung: Secondary | ICD-10-CM

## 2019-02-05 DIAGNOSIS — R911 Solitary pulmonary nodule: Secondary | ICD-10-CM

## 2019-02-05 NOTE — Patient Instructions (Addendum)
Patient will have stitches and staples removed today. Patient will follow up with Dr.Oaks in six weeks.  Flexible Bronchoscopy, Care After This sheet gives you information about how to care for yourself after your test. Your doctor may also give you more specific instructions. If you have problems or questions, contact your doctor. Follow these instructions at home: Eating and drinking  Do not eat or drink anything (not even water) for 2 hours after your test, or until your numbing medicine (local anesthetic) wears off.  When your numbness is gone and your cough and gag reflexes have come back, you may: ? Eat only soft foods. ? Slowly drink liquids.  The day after the test, go back to your normal diet. Driving  Do not drive for 24 hours if you were given a medicine to help you relax (sedative).  Do not drive or use heavy machinery while taking prescription pain medicine. General instructions   Take over-the-counter and prescription medicines only as told by your doctor.  Return to your normal activities as told. Ask what activities are safe for you.  Do not use any products that have nicotine or tobacco in them. This includes cigarettes and e-cigarettes. If you need help quitting, ask your doctor.  Keep all follow-up visits as told by your doctor. This is important. It is very important if you had a tissue sample (biopsy) taken. Get help right away if:  You have shortness of breath that gets worse.  You get light-headed.  You feel like you are going to pass out (faint).  You have chest pain.  You cough up: ? More than a little blood. ? More blood than before. Summary  Do not eat or drink anything (not even water) for 2 hours after your test, or until your numbing medicine wears off.  Do not use cigarettes. Do not use e-cigarettes.  Get help right away if you have chest pain. This information is not intended to replace advice given to you by your health care provider.  Make sure you discuss any questions you have with your health care provider. Document Released: 12/16/2008 Document Revised: 01/31/2017 Document Reviewed: 03/08/2016 Elsevier Patient Education  2020 [employer].

## 2019-02-05 NOTE — Progress Notes (Signed)
  Patient ID: Kristopher [name], male   DOB: [birth year], 58 y.o.   MRN: [id number]  HISTORY: He returns today in follow-up.  He is now about 3 weeks out from his left thoracotomy and left lower lobectomy.  He has had no problems with his bowels.  His cardiac monitor comes off today.  He states that he has been checking his blood sugars and they have been ranging less than 150.  He does not smoke any longer.  He is has some pain anteriorly along his incision and some numbness along the area as well.  He is not short of breath.  He did have a chest x-ray made yesterday which I have independently reviewed.  That shows no pleural effusion or pneumothorax   Vitals:   02/05/19 0802  BP: (!) 158/98  Pulse: 73  Temp: 97.7 F (36.5 C)  SpO2: 96%     EXAM:    Resp: Lungs are clear bilaterally.  No respiratory distress, normal effort. Heart:  Regular without murmurs Abd:  Abdomen is soft, non distended and non tender. No masses are palpable.  There is no rebound and no guarding.  Neurological: Alert and oriented to person, place, and time. Coordination normal.  Skin: Skin is warm and dry. No rash noted. No diaphoretic. No erythema. No pallor.  His thoracotomy wound is healing as expected.  Psychiatric: Normal mood and affect. Normal behavior. Judgment and thought content normal.    ASSESSMENT: Status post left lower lobectomy for stage I squamous cell carcinoma of the lung   PLAN:   We will remove all of his sutures today.  We will Steri-Strip his wound.  I will see him back again in 6 weeks.  I have asked him to continue his follow-up with his primary care physician regarding his blood pressure and diabetes management.  Of also asked him to contact his pain management service for their continued input regarding his medications I would like to see him back again in 6 weeks with a chest x-ray and also have him follow-up with Dr. Lynett Fish in oncology    Nestor Lewandowsky, MD

## 2019-02-06 DIAGNOSIS — Z09 Encounter for follow-up examination after completed treatment for conditions other than malignant neoplasm: Secondary | ICD-10-CM | POA: Insufficient documentation

## 2019-02-08 ENCOUNTER — Ambulatory Visit (INDEPENDENT_AMBULATORY_CARE_PROVIDER_SITE_OTHER): Payer: Medicare Other | Admitting: Family Medicine

## 2019-02-08 ENCOUNTER — Encounter: Payer: Self-pay | Admitting: Family Medicine

## 2019-02-08 ENCOUNTER — Other Ambulatory Visit: Payer: Self-pay

## 2019-02-08 VITALS — BP 165/113 | HR 84

## 2019-02-08 DIAGNOSIS — R059 Cough, unspecified: Secondary | ICD-10-CM

## 2019-02-08 DIAGNOSIS — R05 Cough: Secondary | ICD-10-CM

## 2019-02-08 MED ORDER — PREDNISONE 50 MG PO TABS: 50.0000 mg | ORAL_TABLET | Freq: Every day | ORAL | 0 refills | Status: DC

## 2019-02-08 NOTE — Progress Notes (Signed)
BP (!) 165/113   Pulse 84   SpO2 96%    Subjective:    Patient ID: Kristopher [name], male    DOB: November 24, 1960, 58 y.o.   MRN: [id number]  HPI: Kristopher [name] is a 58 y.o. male  Chief Complaint  Patient presents with  . Cough    congestion, runny nose   UPPER RESPIRATORY TRACT INFECTION Duration: 2 days ago Worst symptom: cough Fever: no Cough: yes Shortness of breath: no Wheezing: yes Chest pain: yes, with cough Chest tightness: no Chest congestion: no Nasal congestion: yes Runny nose: yes Post nasal drip: yes Sneezing: no Sore throat: no Swollen glands: no Sinus pressure: yes Headache: no Face pain: no Toothache: no Ear pain: no  Ear pressure: yes, feel like they've been running  Eyes red/itching:no Eye drainage/crusting: no  Vomiting: no Rash: no Fatigue: yes Sick contacts: no Strep contacts: no  Context: worse Recurrent sinusitis: no Relief with OTC cold/cough medications: no  Treatments attempted: none   Relevant past medical, surgical, family and social history reviewed and updated as indicated. Interim medical history since our last visit reviewed. Allergies and medications reviewed and updated.  Review of Systems  Constitutional: Positive for fatigue. Negative for activity change, appetite change, chills, diaphoresis, fever and unexpected weight change.  HENT: Positive for congestion, ear discharge, postnasal drip and rhinorrhea. Negative for dental problem, drooling, ear pain, facial swelling, hearing loss, mouth sores, nosebleeds, sinus pressure, sinus pain, sneezing, sore throat, tinnitus, trouble swallowing and voice change.   Eyes: Negative.   Respiratory: Positive for cough and wheezing. Negative for apnea, choking, chest tightness, shortness of breath and stridor.   Cardiovascular: Negative.   Gastrointestinal: Negative.   Psychiatric/Behavioral: Negative.     Per HPI unless specifically indicated above     Objective:    BP (!)  165/113   Pulse 84   SpO2 96%   Wt Readings from Last 3 Encounters:  02/05/19 221 lb 6.4 oz (100.4 kg)  01/25/19 222 lb 8 oz (100.9 kg)  01/08/19 234 lb 8 oz (106.4 kg)    Physical Exam Vitals signs and nursing note reviewed.  Pulmonary:     Effort: Pulmonary effort is normal. No respiratory distress.     Comments: Speaking in full sentences Neurological:     Mental Status: He is alert.  Psychiatric:        Mood and Affect: Mood normal.        Behavior: Behavior normal.        Thought Content: Thought content normal.        Judgment: Judgment normal.     Results for orders placed or performed during the hospital encounter of 01/14/19  MRSA PCR Screening   Specimen: Nasal Mucosa; Nasopharyngeal  Result Value Ref Range   MRSA by PCR NEGATIVE NEGATIVE  Glucose, capillary  Result Value Ref Range   Glucose-Capillary 109 (H) 70 - 99 mg/dL  Glucose, capillary  Result Value Ref Range   Glucose-Capillary 154 (H) 70 - 99 mg/dL  CBC  Result Value Ref Range   WBC 17.4 (H) 4.0 - 10.5 K/uL   RBC 4.69 4.22 - 5.81 MIL/uL   Hemoglobin 13.7 13.0 - 17.0 g/dL   HCT 41.4 39.0 - 52.0 %   MCV 88.3 80.0 - 100.0 fL   MCH 29.2 26.0 - 34.0 pg   MCHC 33.1 30.0 - 36.0 g/dL   RDW 13.0 11.5 - 15.5 %   Platelets 218 150 - 400 K/uL  nRBC 0.0 0.0 - 0.2 %  Basic metabolic panel  Result Value Ref Range   Sodium 137 135 - 145 mmol/L   Potassium 4.4 3.5 - 5.1 mmol/L   Chloride 102 98 - 111 mmol/L   CO2 25 22 - 32 mmol/L   Glucose, Bld 217 (H) 70 - 99 mg/dL   BUN 14 6 - 20 mg/dL   Creatinine, Ser 0.97 0.61 - 1.24 mg/dL   Calcium 8.4 (L) 8.9 - 10.3 mg/dL   GFR calc non Af Amer >60 >60 mL/min   GFR calc Af Amer >60 >60 mL/min   Anion gap 10 5 - 15  Glucose, capillary  Result Value Ref Range   Glucose-Capillary 193 (H) 70 - 99 mg/dL  CBC with Differential/Platelet  Result Value Ref Range   WBC 13.7 (H) 4.0 - 10.5 K/uL   RBC 4.47 4.22 - 5.81 MIL/uL   Hemoglobin 13.2 13.0 - 17.0 g/dL   HCT  39.7 39.0 - 52.0 %   MCV 88.8 80.0 - 100.0 fL   MCH 29.5 26.0 - 34.0 pg   MCHC 33.2 30.0 - 36.0 g/dL   RDW 13.1 11.5 - 15.5 %   Platelets 203 150 - 400 K/uL   nRBC 0.0 0.0 - 0.2 %   Neutrophils Relative % 85 %   Neutro Abs 11.6 (H) 1.7 - 7.7 K/uL   Lymphocytes Relative 9 %   Lymphs Abs 1.3 0.7 - 4.0 K/uL   Monocytes Relative 5 %   Monocytes Absolute 0.7 0.1 - 1.0 K/uL   Eosinophils Relative 0 %   Eosinophils Absolute 0.0 0.0 - 0.5 K/uL   Basophils Relative 0 %   Basophils Absolute 0.0 0.0 - 0.1 K/uL   Immature Granulocytes 1 %   Abs Immature Granulocytes 0.09 (H) 0.00 - 0.07 K/uL  Basic metabolic panel  Result Value Ref Range   Sodium 137 135 - 145 mmol/L   Potassium 4.3 3.5 - 5.1 mmol/L   Chloride 102 98 - 111 mmol/L   CO2 26 22 - 32 mmol/L   Glucose, Bld 193 (H) 70 - 99 mg/dL   BUN 14 6 - 20 mg/dL   Creatinine, Ser 0.97 0.61 - 1.24 mg/dL   Calcium 8.3 (L) 8.9 - 10.3 mg/dL   GFR calc non Af Amer >60 >60 mL/min   GFR calc Af Amer >60 >60 mL/min   Anion gap 9 5 - 15  Magnesium  Result Value Ref Range   Magnesium 1.9 1.7 - 2.4 mg/dL  Glucose, capillary  Result Value Ref Range   Glucose-Capillary 171 (H) 70 - 99 mg/dL  Glucose, capillary  Result Value Ref Range   Glucose-Capillary 189 (H) 70 - 99 mg/dL  Glucose, capillary  Result Value Ref Range   Glucose-Capillary 170 (H) 70 - 99 mg/dL  Glucose, capillary  Result Value Ref Range   Glucose-Capillary 152 (H) 70 - 99 mg/dL  Hemoglobin A1c  Result Value Ref Range   Hgb A1c MFr Bld 7.4 (H) 4.8 - 5.6 %   Mean Plasma Glucose 165.68 mg/dL  Glucose, capillary  Result Value Ref Range   Glucose-Capillary 160 (H) 70 - 99 mg/dL  Hemoglobin A1c  Result Value Ref Range   Hgb A1c MFr Bld 7.5 (H) 4.8 - 5.6 %   Mean Plasma Glucose 168.55 mg/dL  Glucose, capillary  Result Value Ref Range   Glucose-Capillary 138 (H) 70 - 99 mg/dL   Comment 1 Notify RN   CBC with Differential/Platelet  Result  Value Ref Range   WBC 17.6 (H)  4.0 - 10.5 K/uL   RBC 5.10 4.22 - 5.81 MIL/uL   Hemoglobin 14.7 13.0 - 17.0 g/dL   HCT 46.4 39.0 - 52.0 %   MCV 91.0 80.0 - 100.0 fL   MCH 28.8 26.0 - 34.0 pg   MCHC 31.7 30.0 - 36.0 g/dL   RDW 13.4 11.5 - 15.5 %   Platelets 252 150 - 400 K/uL   nRBC 0.0 0.0 - 0.2 %   Neutrophils Relative % 69 %   Neutro Abs 12.3 (H) 1.7 - 7.7 K/uL   Lymphocytes Relative 21 %   Lymphs Abs 3.6 0.7 - 4.0 K/uL   Monocytes Relative 9 %   Monocytes Absolute 1.6 (H) 0.1 - 1.0 K/uL   Eosinophils Relative 0 %   Eosinophils Absolute 0.0 0.0 - 0.5 K/uL   Basophils Relative 0 %   Basophils Absolute 0.0 0.0 - 0.1 K/uL   Immature Granulocytes 1 %   Abs Immature Granulocytes 0.09 (H) 0.00 - 0.07 K/uL  Basic metabolic panel  Result Value Ref Range   Sodium 138 135 - 145 mmol/L   Potassium 4.2 3.5 - 5.1 mmol/L   Chloride 100 98 - 111 mmol/L   CO2 29 22 - 32 mmol/L   Glucose, Bld 156 (H) 70 - 99 mg/dL   BUN 15 6 - 20 mg/dL   Creatinine, Ser 0.94 0.61 - 1.24 mg/dL   Calcium 8.9 8.9 - 10.3 mg/dL   GFR calc non Af Amer >60 >60 mL/min   GFR calc Af Amer >60 >60 mL/min   Anion gap 9 5 - 15  Brain natriuretic peptide  Result Value Ref Range   B Natriuretic Peptide 69.0 0.0 - 100.0 pg/mL  Hepatic function panel  Result Value Ref Range   Total Protein 7.9 6.5 - 8.1 g/dL   Albumin 4.3 3.5 - 5.0 g/dL   AST 54 (H) 15 - 41 U/L   ALT 59 (H) 0 - 44 U/L   Alkaline Phosphatase 103 38 - 126 U/L   Total Bilirubin 1.2 0.3 - 1.2 mg/dL   Bilirubin, Direct 0.2 0.0 - 0.2 mg/dL   Indirect Bilirubin 1.0 (H) 0.3 - 0.9 mg/dL  Magnesium  Result Value Ref Range   Magnesium 2.1 1.7 - 2.4 mg/dL  Phosphorus  Result Value Ref Range   Phosphorus 3.2 2.5 - 4.6 mg/dL  Lactic acid, plasma  Result Value Ref Range   Lactic Acid, Venous 2.1 (HH) 0.5 - 1.9 mmol/L  Procalcitonin - Baseline  Result Value Ref Range   Procalcitonin 0.32 ng/mL  Glucose, capillary  Result Value Ref Range   Glucose-Capillary 161 (H) 70 - 99 mg/dL   Lactic acid, plasma  Result Value Ref Range   Lactic Acid, Venous 1.7 0.5 - 1.9 mmol/L  Glucose, capillary  Result Value Ref Range   Glucose-Capillary 145 (H) 70 - 99 mg/dL  Procalcitonin  Result Value Ref Range   Procalcitonin 0.25 ng/mL  Glucose, capillary  Result Value Ref Range   Glucose-Capillary 171 (H) 70 - 99 mg/dL  Glucose, capillary  Result Value Ref Range   Glucose-Capillary 139 (H) 70 - 99 mg/dL  am cbc  Result Value Ref Range   WBC 15.1 (H) 4.0 - 10.5 K/uL   RBC 4.67 4.22 - 5.81 MIL/uL   Hemoglobin 13.6 13.0 - 17.0 g/dL   HCT 43.2 39.0 - 52.0 %   MCV 92.5 80.0 - 100.0 fL   MCH 29.1  26.0 - 34.0 pg   MCHC 31.5 30.0 - 36.0 g/dL   RDW 13.5 11.5 - 15.5 %   Platelets 251 150 - 400 K/uL   nRBC 0.0 0.0 - 0.2 %  Comprehensive metabolic panel  Result Value Ref Range   Sodium 135 135 - 145 mmol/L   Potassium 3.7 3.5 - 5.1 mmol/L   Chloride 99 98 - 111 mmol/L   CO2 26 22 - 32 mmol/L   Glucose, Bld 176 (H) 70 - 99 mg/dL   BUN 25 (H) 6 - 20 mg/dL   Creatinine, Ser 0.87 0.61 - 1.24 mg/dL   Calcium 8.6 (L) 8.9 - 10.3 mg/dL   Total Protein 7.2 6.5 - 8.1 g/dL   Albumin 3.7 3.5 - 5.0 g/dL   AST 31 15 - 41 U/L   ALT 43 0 - 44 U/L   Alkaline Phosphatase 92 38 - 126 U/L   Total Bilirubin 1.1 0.3 - 1.2 mg/dL   GFR calc non Af Amer >60 >60 mL/min   GFR calc Af Amer >60 >60 mL/min   Anion gap 10 5 - 15  Magnesium  Result Value Ref Range   Magnesium 2.5 (H) 1.7 - 2.4 mg/dL  Phosphorus  Result Value Ref Range   Phosphorus 3.1 2.5 - 4.6 mg/dL  Glucose, capillary  Result Value Ref Range   Glucose-Capillary 169 (H) 70 - 99 mg/dL  Glucose, capillary  Result Value Ref Range   Glucose-Capillary 206 (H) 70 - 99 mg/dL  Glucose, capillary  Result Value Ref Range   Glucose-Capillary 125 (H) 70 - 99 mg/dL  Procalcitonin  Result Value Ref Range   Procalcitonin 0.10 ng/mL  am cbc  Result Value Ref Range   WBC 13.0 (H) 4.0 - 10.5 K/uL   RBC 4.59 4.22 - 5.81 MIL/uL    Hemoglobin 13.5 13.0 - 17.0 g/dL   HCT 40.9 39.0 - 52.0 %   MCV 89.1 80.0 - 100.0 fL   MCH 29.4 26.0 - 34.0 pg   MCHC 33.0 30.0 - 36.0 g/dL   RDW 13.2 11.5 - 15.5 %   Platelets 257 150 - 400 K/uL   nRBC 0.0 0.0 - 0.2 %  Glucose, capillary  Result Value Ref Range   Glucose-Capillary 139 (H) 70 - 99 mg/dL  Glucose, capillary  Result Value Ref Range   Glucose-Capillary 136 (H) 70 - 99 mg/dL  Glucose, capillary  Result Value Ref Range   Glucose-Capillary 111 (H) 70 - 99 mg/dL  Glucose, capillary  Result Value Ref Range   Glucose-Capillary 169 (H) 70 - 99 mg/dL  Basic metabolic panel  Result Value Ref Range   Sodium 134 (L) 135 - 145 mmol/L   Potassium 3.4 (L) 3.5 - 5.1 mmol/L   Chloride 100 98 - 111 mmol/L   CO2 27 22 - 32 mmol/L   Glucose, Bld 132 (H) 70 - 99 mg/dL   BUN 23 (H) 6 - 20 mg/dL   Creatinine, Ser 0.97 0.61 - 1.24 mg/dL   Calcium 8.3 (L) 8.9 - 10.3 mg/dL   GFR calc non Af Amer >60 >60 mL/min   GFR calc Af Amer >60 >60 mL/min   Anion gap 7 5 - 15  Glucose, capillary  Result Value Ref Range   Glucose-Capillary 120 (H) 70 - 99 mg/dL  Glucose, capillary  Result Value Ref Range   Glucose-Capillary 140 (H) 70 - 99 mg/dL   Comment 1 Notify RN    Comment 2 Document in Chart  Glucose, capillary  Result Value Ref Range   Glucose-Capillary 92 70 - 99 mg/dL   Comment 1 Notify RN    Comment 2 Document in Chart   Basic metabolic panel  Result Value Ref Range   Sodium 137 135 - 145 mmol/L   Potassium 3.5 3.5 - 5.1 mmol/L   Chloride 102 98 - 111 mmol/L   CO2 27 22 - 32 mmol/L   Glucose, Bld 138 (H) 70 - 99 mg/dL   BUN 19 6 - 20 mg/dL   Creatinine, Ser 0.85 0.61 - 1.24 mg/dL   Calcium 8.8 (L) 8.9 - 10.3 mg/dL   GFR calc non Af Amer >60 >60 mL/min   GFR calc Af Amer >60 >60 mL/min   Anion gap 8 5 - 15  Magnesium  Result Value Ref Range   Magnesium 1.9 1.7 - 2.4 mg/dL  Glucose, capillary  Result Value Ref Range   Glucose-Capillary 117 (H) 70 - 99 mg/dL  Glucose,  capillary  Result Value Ref Range   Glucose-Capillary 114 (H) 70 - 99 mg/dL  Glucose, capillary  Result Value Ref Range   Glucose-Capillary 152 (H) 70 - 99 mg/dL  Glucose, capillary  Result Value Ref Range   Glucose-Capillary 123 (H) 70 - 99 mg/dL  Glucose, capillary  Result Value Ref Range   Glucose-Capillary 125 (H) 70 - 99 mg/dL  Renal function panel  Result Value Ref Range   Sodium 138 135 - 145 mmol/L   Potassium 4.0 3.5 - 5.1 mmol/L   Chloride 99 98 - 111 mmol/L   CO2 26 22 - 32 mmol/L   Glucose, Bld 159 (H) 70 - 99 mg/dL   BUN 18 6 - 20 mg/dL   Creatinine, Ser 0.99 0.61 - 1.24 mg/dL   Calcium 9.0 8.9 - 10.3 mg/dL   Phosphorus 3.2 2.5 - 4.6 mg/dL   Albumin 3.5 3.5 - 5.0 g/dL   GFR calc non Af Amer >60 >60 mL/min   GFR calc Af Amer >60 >60 mL/min   Anion gap 13 5 - 15  Glucose, capillary  Result Value Ref Range   Glucose-Capillary 114 (H) 70 - 99 mg/dL  Glucose, capillary  Result Value Ref Range   Glucose-Capillary 134 (H) 70 - 99 mg/dL  Magnesium  Result Value Ref Range   Magnesium 2.0 1.7 - 2.4 mg/dL  Glucose, capillary  Result Value Ref Range   Glucose-Capillary 151 (H) 70 - 99 mg/dL  Glucose, capillary  Result Value Ref Range   Glucose-Capillary 155 (H) 70 - 99 mg/dL  Glucose, capillary  Result Value Ref Range   Glucose-Capillary 139 (H) 70 - 99 mg/dL  Basic metabolic panel  Result Value Ref Range   Sodium 137 135 - 145 mmol/L   Potassium 3.4 (L) 3.5 - 5.1 mmol/L   Chloride 97 (L) 98 - 111 mmol/L   CO2 29 22 - 32 mmol/L   Glucose, Bld 127 (H) 70 - 99 mg/dL   BUN 19 6 - 20 mg/dL   Creatinine, Ser 1.03 0.61 - 1.24 mg/dL   Calcium 8.6 (L) 8.9 - 10.3 mg/dL   GFR calc non Af Amer >60 >60 mL/min   GFR calc Af Amer >60 >60 mL/min   Anion gap 11 5 - 15  Magnesium  Result Value Ref Range   Magnesium 2.2 1.7 - 2.4 mg/dL  Glucose, capillary  Result Value Ref Range   Glucose-Capillary 123 (H) 70 - 99 mg/dL  Glucose, capillary  Result Value Ref Range  Glucose-Capillary 141 (H) 70 - 99 mg/dL  CBC  Result Value Ref Range   WBC 11.6 (H) 4.0 - 10.5 K/uL   RBC 4.65 4.22 - 5.81 MIL/uL   Hemoglobin 13.6 13.0 - 17.0 g/dL   HCT 41.5 39.0 - 52.0 %   MCV 89.2 80.0 - 100.0 fL   MCH 29.2 26.0 - 34.0 pg   MCHC 32.8 30.0 - 36.0 g/dL   RDW 13.2 11.5 - 15.5 %   Platelets 317 150 - 400 K/uL   nRBC 0.0 0.0 - 0.2 %  Protime-INR  Result Value Ref Range   Prothrombin Time 14.8 11.4 - 15.2 seconds   INR 1.2 0.8 - 1.2  APTT  Result Value Ref Range   aPTT 28 24 - 36 seconds  Glucose, capillary  Result Value Ref Range   Glucose-Capillary 131 (H) 70 - 99 mg/dL  Glucose, capillary  Result Value Ref Range   Glucose-Capillary 247 (H) 70 - 99 mg/dL  Glucose, capillary  Result Value Ref Range   Glucose-Capillary 102 (H) 70 - 99 mg/dL  Basic metabolic panel  Result Value Ref Range   Sodium 137 135 - 145 mmol/L   Potassium 3.9 3.5 - 5.1 mmol/L   Chloride 98 98 - 111 mmol/L   CO2 28 22 - 32 mmol/L   Glucose, Bld 136 (H) 70 - 99 mg/dL   BUN 22 (H) 6 - 20 mg/dL   Creatinine, Ser 0.95 0.61 - 1.24 mg/dL   Calcium 8.6 (L) 8.9 - 10.3 mg/dL   GFR calc non Af Amer >60 >60 mL/min   GFR calc Af Amer >60 >60 mL/min   Anion gap 11 5 - 15  Glucose, capillary  Result Value Ref Range   Glucose-Capillary 106 (H) 70 - 99 mg/dL  Magnesium  Result Value Ref Range   Magnesium 2.3 1.7 - 2.4 mg/dL  Phosphorus  Result Value Ref Range   Phosphorus 3.5 2.5 - 4.6 mg/dL  Glucose, capillary  Result Value Ref Range   Glucose-Capillary 130 (H) 70 - 99 mg/dL  Glucose, capillary  Result Value Ref Range   Glucose-Capillary 201 (H) 70 - 99 mg/dL  Glucose, capillary  Result Value Ref Range   Glucose-Capillary 107 (H) 70 - 99 mg/dL   Comment 1 Notify RN    Comment 2 Document in Chart   Basic metabolic panel  Result Value Ref Range   Sodium 137 135 - 145 mmol/L   Potassium 3.4 (L) 3.5 - 5.1 mmol/L   Chloride 98 98 - 111 mmol/L   CO2 27 22 - 32 mmol/L   Glucose, Bld  130 (H) 70 - 99 mg/dL   BUN 19 6 - 20 mg/dL   Creatinine, Ser 1.03 0.61 - 1.24 mg/dL   Calcium 9.0 8.9 - 10.3 mg/dL   GFR calc non Af Amer >60 >60 mL/min   GFR calc Af Amer >60 >60 mL/min   Anion gap 12 5 - 15  Magnesium  Result Value Ref Range   Magnesium 2.4 1.7 - 2.4 mg/dL  Glucose, capillary  Result Value Ref Range   Glucose-Capillary 143 (H) 70 - 99 mg/dL  Glucose, capillary  Result Value Ref Range   Glucose-Capillary 145 (H) 70 - 99 mg/dL  Glucose, capillary  Result Value Ref Range   Glucose-Capillary 124 (H) 70 - 99 mg/dL  Basic metabolic panel  Result Value Ref Range   Sodium 138 135 - 145 mmol/L   Potassium 3.8 3.5 - 5.1 mmol/L  Chloride 102 98 - 111 mmol/L   CO2 26 22 - 32 mmol/L   Glucose, Bld 136 (H) 70 - 99 mg/dL   BUN 17 6 - 20 mg/dL   Creatinine, Ser 1.07 0.61 - 1.24 mg/dL   Calcium 8.9 8.9 - 10.3 mg/dL   GFR calc non Af Amer >60 >60 mL/min   GFR calc Af Amer >60 >60 mL/min   Anion gap 10 5 - 15  Magnesium  Result Value Ref Range   Magnesium 2.4 1.7 - 2.4 mg/dL  Glucose, capillary  Result Value Ref Range   Glucose-Capillary 127 (H) 70 - 99 mg/dL  Glucose, capillary  Result Value Ref Range   Glucose-Capillary 152 (H) 70 - 99 mg/dL  Glucose, capillary  Result Value Ref Range   Glucose-Capillary 115 (H) 70 - 99 mg/dL  ECHOCARDIOGRAM COMPLETE  Result Value Ref Range   Weight 3,752.02 oz   Height 70 in   BP 133/89 mmHg  Prepare RBC (crossmatch)  Result Value Ref Range   Order Confirmation      ORDER PROCESSED BY BLOOD BANK Performed at [facility], [address] [place], [place] [address]   Type and screen [place]  Result Value Ref Range   ABO/RH(D) A POS    Antibody Screen NEG    Sample Expiration      01/17/2019,2359 Performed at [facility], [address], [place],  [address]    Unit Number [id number]    Blood Component Type RED CELLS,LR    Unit division 00    Status of Unit REL  FROM [facility]    Transfusion Status OK TO TRANSFUSE    Crossmatch Result Compatible    Unit Number [id number]    Blood Component Type RBC LR PHER1    Unit division 00    Status of Unit REL FROM [facility]    Transfusion Status OK TO TRANSFUSE    Crossmatch Result Compatible   Surgical pathology  Result Value Ref Range   SURGICAL PATHOLOGY      SURGICAL PATHOLOGY CASE: [id number] PATIENT: Terrilyn Saver Surgical Pathology Report     Specimen Submitted: A. Lung, left lower lobe B. L6 (left para-aorta) node C. L9 left lung lobe, pulmonary ligament D. L10 left lung lobe, hilar E. L10 left lung lobe, hilar  Clinical History: Lung mass    DIAGNOSIS: A. LUNG, LEFT LOWER LOBE; LOBECTOMY: - INVASIVE SQUAMOUS CELL CARCINOMA, KERATINIZING. - FOUR LYMPH NODES, NEGATIVE FOR MALIGNANCY (0/4). - SEE CANCER SUMMARY.  B. LYMPH NODE, STATION 6 (LEFT PARA-AORTIC); EXCISION: - ONE LYMPH NODE, NEGATIVE FOR MALIGNANCY (0/1).  C. LYMPH NODE, STATION 9 (LEFT PULMONARY LIGAMENT); EXCISION: - TWO LYMPH NODES, NEGATIVE FOR MALIGNANCY (0/2).  D. LYMPH NODE, STATION 10 (LEFT HILAR); EXCISION: - ONE LYMPH NODE, NEGATIVE FOR MALIGNANCY (0/1).  E. LYMPH NODE, STATION 10 (LEFT HILAR); EXCISION: - BENIGN FIBROFATTY TISSUE. - NO LYMPH NODE IDENTIFIED.  CANCER CASE SUMMARY: LUNG Procedure: Lobectomy Specimen Lateralit y: Left Tumor Site: Lower lobe Tumor Size:                      Total tumor size: Greatest dimension: 1.5 cm Tumor Focality: Single focus Histologic Type: Invasive squamous cell carcinoma, keratinizing Visceral Pleura Invasion: Not identified Lymphovascular Invasion: Not identified Direct Invasion of Adjacent Structures: No adjacent structures present Margins: All margins are uninvolved by tumor Regional Lymph Nodes: Number of Lymph Nodes Involved: 0 Number of Lymph Nodes Examined: 8 Specify nodal  stations examined: Stations L6, L9, and L10  Treatment Effect: No known  presurgical therapy Pathologic Stage Classification (pTNM, AJCC 8th Edition): pT1b pN0 TNM Descriptors:  GROSS DESCRIPTION: A. Labeled: Left lower lobe lung Received: In formalin Type of procedure: Lung lobectomy Laterality: Left Weight of specimen: 202 grams Size of specimen: 17 x 13 x 5.5 cm Other attached structures: Not identified Specimen integrity: Intact Orientation: Unoriented Number of  masses: 1 Size(s) of mass(es): 2.3 x 1.2 x 1.0 cm Location of mass(es): Left low lung lobe Description of mass(es): White irregular solid mass with central cavitation (biopsy site containing jelly material) Relationship of mass(es) to bronchus: Grossly tumor arises from lung parenchyma.  There is not grossly evidence affiliation with bronchial tree Margins: Bronchial margin: 3.0 cm Parenchymal hilar margin: 3.5 cm Vascular margin: 2.0 cm Relationship / distance of mass(es) to pleura: 0.5 cm Extension of mass(es): Not identified Description of remainder of lung: Red spongy lung parenchyma without visible or palpable additional abnormality Lymph nodes: For possible black soft peribronchial lymph nodes identified ranging from 0.3 to 0.4 cm in greatest dimension  Block summary: 1-bronchial margin 2-vascular margin 3-6-entirely submitted tumor 7-8-lower lobe hilum with distal vascular and bronchial structure (not margin) 9-10-peripheral lung parenchyma 11-14 candi dates for lymph nodes   B. Labeled: L6 left para-aortic lymph node Received: In formalin Tissue fragment(s): 1 Size: 0.9 x 0.6 x 0.4 cm Description: Black soft single lymph node Entirely submitted in 1 cassette.  C. Labeled: L9 left lung pulmonary lymph node Received: In formalin Tissue fragment(s): 1 Size: 0.5 x 0.4 x 0.3 cm Description: Black soft lymph node Entirely submitted in 1 cassette.  D. Labeled: L 10 hilar left lung lobe Received: In formalin Tissue fragment(s): 1 Size: 0.4 x 0.3 x 0.2 cm  Description: Black soft lymph node Entirely submitted in 1 cassette.  E. Labeled: L 10 hilar left lung lobe Received: In formalin Tissue fragment(s): 1 Size: 0.5 x 0.3 x 0.2 cm Description: Fibrofatty soft tissue fragment Entirely submitted in 1 cassette.     Final Diagnosis performed by Allena Napoleon, MD.   Electronically signed 01/18/2019 1:16:55PM The electronic signature indicates that the named Attending Pathologist has evaluated the specimen Technic al component performed at [facility], [address] [place], [place] [address] Lab: [phone] Dir: Rush Farmer, MD, MMM  Professional component performed at [facility], [facility], [place], [place], [place] [address] Lab: [phone] Dir: Dellia Nims. Reuel Derby, MD   BP[facility]  Result Value Ref Range   Blood Product Unit Number [id number]    PRODUCT CODE [id number]    Unit Type and Rh 6200    Blood Product Expiration Date [id number]    Blood Product Unit Number [id number]    PRODUCT CODE [id number]    Unit Type and Rh 6200    Blood Product Expiration Date [id number]   Troponin I (High Sensitivity)  Result Value Ref Range   Troponin I (High Sensitivity) 19 (H) <18 ng/L  Troponin I (High Sensitivity)  Result Value Ref Range   Troponin I (High Sensitivity) 21 (H) <18 ng/L      Assessment & Plan:   Problem List Items Addressed This Visit    None    Visit Diagnoses    Cough    -  Primary   Discharged from hospital 2 weeks ago. Complicated by recent lung surgery. Will test for COVID- self-quarantine until results back. Prednisone burst. Recheck 2d.   Relevant Orders   Novel Coronavirus, NAA ([facility])  Follow up plan: Return 2-3 days.    . This visit was completed via telephone due to the restrictions of the COVID-19 pandemic. All issues as above were discussed and addressed but no physical exam was performed. If it was felt that the patient should be evaluated in the office, they  were directed there. The patient verbally consented to this visit. Patient was unable to complete an audio/visual visit due to Lack of equipment. Due to the catastrophic nature of the COVID-19 pandemic, this visit was done through audio contact only. . Location of the patient: home . Location of the provider: work . Those involved with this call:  . Provider: Park Liter, DO . CMA: Tiffany Reel, CMA . Front Desk/Registration: Don Perking  . Time spent on call: 21 minutes on the phone discussing health concerns. 23 minutes total spent in review of patient's record and preparation of their chart.

## 2019-02-09 ENCOUNTER — Other Ambulatory Visit: Payer: Self-pay

## 2019-02-09 DIAGNOSIS — Z20822 Contact with and (suspected) exposure to covid-19: Secondary | ICD-10-CM

## 2019-02-10 ENCOUNTER — Ambulatory Visit: Payer: Self-pay | Admitting: *Deleted

## 2019-02-10 DIAGNOSIS — N181 Chronic kidney disease, stage 1: Secondary | ICD-10-CM

## 2019-02-10 DIAGNOSIS — R739 Hyperglycemia, unspecified: Secondary | ICD-10-CM

## 2019-02-10 DIAGNOSIS — E1122 Type 2 diabetes mellitus with diabetic chronic kidney disease: Secondary | ICD-10-CM

## 2019-02-10 NOTE — Chronic Care Management (AMB) (Signed)
Chronic Care Management   Follow Up Note   02/10/2019 Name: Kristopher [name] MRN: [id number] DOB: 09-Oct-1960  Referred by: [name], DO Reason for referral : Chronic Care Management (Increased Blood sugars )   Kristopher [name] is a 58 y.o. year old male who is a primary care patient of [name], DO. The CCM team was consulted for assistance with chronic disease management and care coordination needs.    Review of patient status, including review of consultants reports, relevant laboratory and other test results, and collaboration with appropriate care team members and the patient's provider was performed as part of comprehensive patient evaluation and provision of chronic care management services.    SDOH (Social Determinants of Health) screening performed today: None. See Care Plan for related entries.   Outpatient Encounter Medications as of 02/10/2019  Medication Sig  . amiodarone (PACERONE) 200 MG tablet Take 1 tablet (200 mg total) by mouth 2 (two) times daily.  [name] amLODipine (NORVASC) 5 MG tablet Take 1 tablet (5 mg total) by mouth 2 (two) times daily.  [name] atorvastatin (LIPITOR) 10 MG tablet Take 1 tablet (10 mg total) by mouth daily at 6 PM.  . hydrALAZINE (APRESOLINE) 100 MG tablet Take 1 tablet (100 mg total) by mouth 2 (two) times daily.  . metFORMIN (GLUCOPHAGE) 500 MG tablet Take 500 mg by mouth 2 (two) times daily with a meal.  . metoprolol tartrate (LOPRESSOR) 50 MG tablet Take 1 tablet (50 mg total) by mouth 2 (two) times daily.  . NONFORMULARY OR COMPOUNDED ITEM 10% Ketamine/2% Cyclobenzaprine/6% Gabapentin Cream Sig: 1-2 ml to affected area 3-4 times/day. Amount: 240 GM  . oxyCODONE (OXY IR/ROXICODONE) 5 MG immediate release tablet Take 1 tablet (5 mg total) by mouth every 6 (six) hours as needed for moderate pain or severe pain.  . pantoprazole (PROTONIX) 40 MG tablet Take 1 tablet (40 mg total) by mouth daily.  . predniSONE (DELTASONE) 50 MG tablet Take  1 tablet (50 mg total) by mouth daily with breakfast.  . simethicone (MYLICON) 80 MG chewable tablet Chew 1 tablet (80 mg total) by mouth every 6 (six) hours as needed for flatulence.   Facility-Administered Encounter Medications as of 02/10/2019  Medication  . 0.9 %  sodium chloride infusion     Goals Addressed            This Visit's Progress   . RNCM- I need to stay healthy for my granddaughter (pt-stated)       Current Barriers:  [name] Knowledge Deficits related to basic understanding of hypertension pathophysiology and self care management . Knowledge Deficits related to understanding of medications prescribed for management of hypertension . Patient continues to smoke 1.5 packs a day-reporting has almost completely quit . Exercise program limited by need of a new knee brace . New lung cancer diagnosis- 12/2018  Case Manager Clinical Goal(s):  [name] Over the next 90 days, patient will verbalize understanding of plan for hypertension management  Interventions:  . Discussed plans with patient for ongoing care management follow up and provided patient with direct contact information for care management team  . Patient reached out to state since he has started taking prednisone his blood sugar has been elevated.  [name] Collaborated with CCM Pharm D.  . Discussed carefully watching diet while on the prednisone, patient was drinking orange juice.  . Discussed side effects of prednisone and made patient aware if sugars remained elevated to please let us know.  Patient Self Care Activities:  . UNABLE to independently manage HTN as evidenced by reported headaches and high b/p numbers.  . Checks BP and records as discussed  Please see past updates related to this goal by clicking on the "Past Updates" button in the selected goal           The patient has been provided with contact information for the care management team and has been advised to call with any health related questions or  concerns.   [name] Halim Surrette RN, BSN Nurse Case Editor, [facility]  [phone]) Business Mobile

## 2019-02-11 ENCOUNTER — Other Ambulatory Visit: Payer: Self-pay

## 2019-02-11 ENCOUNTER — Encounter: Payer: Self-pay | Admitting: Oncology

## 2019-02-11 LAB — NOVEL CORONAVIRUS, NAA: SARS-CoV-2, NAA: NOT DETECTED

## 2019-02-11 NOTE — Progress Notes (Signed)
Patient is here today post lung surgery by Dr. Genevive Bi, pt has no complaints or concerns.

## 2019-02-12 ENCOUNTER — Other Ambulatory Visit: Payer: Self-pay

## 2019-02-12 ENCOUNTER — Inpatient Hospital Stay: Payer: Medicare Other | Attending: Oncology | Admitting: Oncology

## 2019-02-12 ENCOUNTER — Encounter: Payer: Self-pay | Admitting: Oncology

## 2019-02-12 VITALS — BP 160/97 | HR 95 | Temp 97.4°F | Resp 20 | Wt 222.1 lb

## 2019-02-12 DIAGNOSIS — M1990 Unspecified osteoarthritis, unspecified site: Secondary | ICD-10-CM | POA: Insufficient documentation

## 2019-02-12 DIAGNOSIS — E1122 Type 2 diabetes mellitus with diabetic chronic kidney disease: Secondary | ICD-10-CM | POA: Insufficient documentation

## 2019-02-12 DIAGNOSIS — Z880 Allergy status to penicillin: Secondary | ICD-10-CM | POA: Diagnosis not present

## 2019-02-12 DIAGNOSIS — R911 Solitary pulmonary nodule: Secondary | ICD-10-CM

## 2019-02-12 DIAGNOSIS — I119 Hypertensive heart disease without heart failure: Secondary | ICD-10-CM | POA: Insufficient documentation

## 2019-02-12 DIAGNOSIS — Z79899 Other long term (current) drug therapy: Secondary | ICD-10-CM | POA: Insufficient documentation

## 2019-02-12 DIAGNOSIS — N181 Chronic kidney disease, stage 1: Secondary | ICD-10-CM | POA: Insufficient documentation

## 2019-02-12 DIAGNOSIS — R5383 Other fatigue: Secondary | ICD-10-CM | POA: Diagnosis not present

## 2019-02-12 DIAGNOSIS — Z809 Family history of malignant neoplasm, unspecified: Secondary | ICD-10-CM | POA: Diagnosis not present

## 2019-02-12 DIAGNOSIS — I083 Combined rheumatic disorders of mitral, aortic and tricuspid valves: Secondary | ICD-10-CM | POA: Diagnosis not present

## 2019-02-12 DIAGNOSIS — R0989 Other specified symptoms and signs involving the circulatory and respiratory systems: Secondary | ICD-10-CM | POA: Insufficient documentation

## 2019-02-12 DIAGNOSIS — K567 Ileus, unspecified: Secondary | ICD-10-CM | POA: Diagnosis not present

## 2019-02-12 DIAGNOSIS — Z886 Allergy status to analgesic agent status: Secondary | ICD-10-CM | POA: Insufficient documentation

## 2019-02-12 DIAGNOSIS — I4891 Unspecified atrial fibrillation: Secondary | ICD-10-CM | POA: Insufficient documentation

## 2019-02-12 DIAGNOSIS — R109 Unspecified abdominal pain: Secondary | ICD-10-CM | POA: Insufficient documentation

## 2019-02-12 DIAGNOSIS — Z833 Family history of diabetes mellitus: Secondary | ICD-10-CM | POA: Insufficient documentation

## 2019-02-12 DIAGNOSIS — J90 Pleural effusion, not elsewhere classified: Secondary | ICD-10-CM | POA: Diagnosis not present

## 2019-02-12 DIAGNOSIS — Z7984 Long term (current) use of oral hypoglycemic drugs: Secondary | ICD-10-CM | POA: Insufficient documentation

## 2019-02-12 DIAGNOSIS — C3432 Malignant neoplasm of lower lobe, left bronchus or lung: Secondary | ICD-10-CM | POA: Insufficient documentation

## 2019-02-12 DIAGNOSIS — R100 Acute abdomen: Secondary | ICD-10-CM | POA: Diagnosis not present

## 2019-02-12 DIAGNOSIS — Z87891 Personal history of nicotine dependence: Secondary | ICD-10-CM | POA: Insufficient documentation

## 2019-02-12 DIAGNOSIS — R531 Weakness: Secondary | ICD-10-CM | POA: Insufficient documentation

## 2019-02-12 DIAGNOSIS — Z8249 Family history of ischemic heart disease and other diseases of the circulatory system: Secondary | ICD-10-CM | POA: Diagnosis not present

## 2019-02-12 DIAGNOSIS — K219 Gastro-esophageal reflux disease without esophagitis: Secondary | ICD-10-CM | POA: Insufficient documentation

## 2019-02-12 DIAGNOSIS — J9600 Acute respiratory failure, unspecified whether with hypoxia or hypercapnia: Secondary | ICD-10-CM | POA: Insufficient documentation

## 2019-02-12 DIAGNOSIS — C3492 Malignant neoplasm of unspecified part of left bronchus or lung: Secondary | ICD-10-CM | POA: Diagnosis not present

## 2019-02-12 NOTE — Progress Notes (Signed)
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  INTERVAL HISTORY: Patient returns to clinic today for further evaluation and hospital follow-up.  He had a complicated hospital course after his thoracotomy and lung resection.  He continues to have weakness and fatigue, but states this is improving.  He continues to have left flank pain at the site of his surgery.  He has no neurologic complaints.  He denies any recent fevers.  He has good appetite and denies weight loss.  He has no chest pain, shortness of breath, cough, or hemoptysis.  He denies any nausea, vomiting, constipation, or diarrhea.  He has no urinary complaints.  Patient offers no further specific complaints today.  REVIEW OF SYSTEMS:   Review of Systems  Constitutional: Positive for malaise/fatigue. Negative for fever and weight loss.  Respiratory: Negative.  Negative for cough and shortness of breath.   Cardiovascular: Negative.  Negative for chest pain and leg swelling.  Gastrointestinal: Negative.  Negative for abdominal pain.  Genitourinary: Positive for flank pain. Negative for dysuria.  Musculoskeletal: Negative for back pain.  Skin: Negative.  Negative for rash.  Neurological: Positive for weakness. Negative for dizziness, focal weakness and headaches.  Psychiatric/Behavioral: Negative.  The patient is not nervous/anxious.     As per HPI. Otherwise, a complete review of  systems is negative.  PAST MEDICAL HISTORY: Past Medical History:  Diagnosis Date  . Allergy   . Arthritis    left foot  . Benign hypertensive kidney disease   . Chronic back pain    Four rods in back  . Diabetes mellitus, type 2 ([place])   . Dyspnea   . GERD (gastroesophageal reflux disease)   . Hypertension   . Migraines    daily    PAST SURGICAL HISTORY: Past Surgical History:  Procedure Laterality Date  . APPENDECTOMY    . BACK SURGERY    . COLONOSCOPY WITH PROPOFOL N/A 02/19/2016   Procedure: COLONOSCOPY WITH PROPOFOL;  Surgeon: Lucilla Lame, MD;  Location: [place];  Service: Endoscopy;  Laterality: N/A;  . COLONOSCOPY WITH PROPOFOL N/A 01/19/2018   Procedure: COLONOSCOPY WITH PROPOFOL;  Surgeon: Lucilla Lame, MD;  Location: [place];  Service: Endoscopy;  Laterality: N/A;  Diabetic - oral meds  . DG OPERATIVE LEFT HIP ([place] H[facility])     10/19  . ELECTROMAGNETIC NAVIGATION BROCHOSCOPY Left 11/18/2018   Procedure: ELECTROMAGNETIC NAVIGATION BRONCHOSCOPY;  Surgeon: Tyler Pita, MD;  Location: [facility];  Service: Cardiopulmonary;  Laterality: Left;  . FLEXIBLE BRONCHOSCOPY Bilateral 01/20/2019   Procedure: FLEXIBLE BRONCHOSCOPY;  Surgeon: Ottie Glazier, MD;  Location: [facility];  Service: Thoracic;  Laterality: Bilateral;  . FLEXIBLE BRONCHOSCOPY Bilateral 01/22/2019   Procedure: FLEXIBLE BRONCHOSCOPY;  Surgeon: Ottie Glazier, MD;  Location: [facility];  Service: Thoracic;  Laterality: Bilateral;  . FOOT SURGERY Left    Screws and plates  . JOINT REPLACEMENT Left 12/2017   DR Rudene Christians Hip  . KNEE SURGERY Left    X 2  . LEG SURGERY    .  LUNG CANCER SURGERY    . POLYPECTOMY N/A 02/19/2016   Procedure: POLYPECTOMY;  Surgeon: Lucilla Lame, MD;  Location: [place];  Service: Endoscopy;  Laterality: N/A;  . POLYPECTOMY  01/19/2018   Procedure: POLYPECTOMY;  Surgeon: Lucilla Lame, MD;  Location: [place];  Service: Endoscopy;;  .  THORACOTOMY Left 01/14/2019   Procedure: THORACOTOMY MAJOR, LEFT;  Surgeon: Nestor Lewandowsky, MD;  Location: [facility];  Service: General;  Laterality: Left;  . TOTAL HIP ARTHROPLASTY Left 12/02/2017   Procedure: TOTAL HIP ARTHROPLASTY ANTERIOR APPROACH;  Surgeon: Hessie Knows, MD;  Location: [facility];  Service: Orthopedics;  Laterality: Left;  [name] VIDEO BRONCHOSCOPY Left 01/14/2019   Procedure: VIDEO BRONCHOSCOPY WITH FLUORO, LEFT;  Surgeon: Nestor Lewandowsky, MD;  Location: [facility];  Service: General;  Laterality: Left;    FAMILY HISTORY: Family History  Problem Relation Age of Onset  . Cancer Father   . Diabetes Sister   . Thrombosis Sister     ADVANCED DIRECTIVES (Y/N):  N  HEALTH MAINTENANCE: Social History   Tobacco Use  . Smoking status: Former Smoker    Packs/day: 0.00    Years: 35.00    Pack years: 0.00  . Smokeless tobacco: Never Used  Substance Use Topics  . Alcohol use: No    Alcohol/week: 0.0 standard drinks  . Drug use: Yes    Types: Oxycodone    Comment: prescribed     Colonoscopy:  PAP:  Bone density:  Lipid panel:  Allergies  Allergen Reactions  . Aspirin Anaphylaxis  . Epinephrine Anaphylaxis    Does not include albuterol  . Lidocaine Anaphylaxis  . Novocain [Procaine] Anaphylaxis  . Penicillins Anaphylaxis    Has patient had a PCN reaction causing immediate rash, facial/tongue/throat swelling, SOB or lightheadedness with hypotension: Yes Has patient had a PCN reaction causing severe rash involving mucus membranes or skin necrosis: No Has patient had a PCN reaction that required hospitalization: Yes Has patient had a PCN reaction occurring within the last 10 years: No If all of the above answers are "NO", then may proceed with Cephalosporin use.   . Strawberry Extract Anaphylaxis  . Shellfish Allergy Hives and Nausea And Vomiting    Current Outpatient Medications  Medication Sig Dispense Refill  . amiodarone (PACERONE) 200 MG tablet Take 1 tablet  (200 mg total) by mouth 2 (two) times daily. 30 tablet 2  . amLODipine (NORVASC) 5 MG tablet Take 1 tablet (5 mg total) by mouth 2 (two) times daily. 60 tablet 3  . atorvastatin (LIPITOR) 10 MG tablet Take 1 tablet (10 mg total) by mouth daily at 6 PM. 15 tablet 2  . hydrALAZINE (APRESOLINE) 100 MG tablet Take 1 tablet (100 mg total) by mouth 2 (two) times daily. 30 tablet 2  . metFORMIN (GLUCOPHAGE) 500 MG tablet Take 500 mg by mouth 2 (two) times daily with a meal.    . metoprolol tartrate (LOPRESSOR) 50 MG tablet Take 1 tablet (50 mg total) by mouth 2 (two) times daily. 30 tablet 2  . NONFORMULARY OR COMPOUNDED ITEM 10% Ketamine/2% Cyclobenzaprine/6% Gabapentin Cream Sig: 1-2 ml to affected area 3-4 times/day. Amount: 240 GM 240 each 5  . oxyCODONE (OXY IR/ROXICODONE) 5 MG immediate release tablet Take 1 tablet (5 mg total) by mouth every 6 (six) hours as needed for moderate pain or severe pain. 30 tablet 0  . pantoprazole (PROTONIX) 40 MG tablet Take 1 tablet (40 mg total) by mouth daily. 15 tablet 2  . predniSONE (  DELTASONE) 50 MG tablet Take 1 tablet (50 mg total) by mouth daily with breakfast. 5 tablet 0  . simethicone (MYLICON) 80 MG chewable tablet Chew 1 tablet (80 mg total) by mouth every 6 (six) hours as needed for flatulence. 30 tablet 0   No current facility-administered medications for this visit.   Facility-Administered Medications Ordered in Other Visits  Medication Dose Route Frequency Provider Last Rate Last Admin  . 0.9 %  sodium chloride infusion    Continuous PRN Dionne Bucy, CRNA   New Bag at 01/22/19 0705    OBJECTIVE: Vitals:   02/12/19 0856  BP: (!) 160/97  Pulse: 95  Resp: 20  Temp: (!) 97.4 F (36.3 C)  SpO2: 98%     Body mass index is 31.87 kg/m.    ECOG FS:0 - Asymptomatic  General: Well-developed, well-nourished, no acute distress. Eyes: Pink conjunctiva, anicteric sclera. HEENT: Normocephalic, moist mucous membranes. Lungs: No audible wheezing  or coughing. Heart: Regular rate and rhythm. Abdomen: Soft, nontender, no obvious distention. Musculoskeletal: No edema, cyanosis, or clubbing. Neuro: Alert, answering all questions appropriately. Cranial nerves grossly intact. Skin: No rashes or petechiae noted. Psych: Normal affect.   LAB RESULTS:  Lab Results  Component Value Date   NA 138 01/25/2019   K 3.8 01/25/2019   CL 102 01/25/2019   CO2 26 01/25/2019   GLUCOSE 136 (H) 01/25/2019   BUN 17 01/25/2019   CREATININE 1.07 01/25/2019   CALCIUM 8.9 01/25/2019   PROT 7.2 01/17/2019   ALBUMIN 3.5 01/20/2019   AST 31 01/17/2019   ALT 43 01/17/2019   ALKPHOS 92 01/17/2019   BILITOT 1.1 01/17/2019   GFRNONAA >60 01/25/2019   GFRAA >60 01/25/2019    Lab Results  Component Value Date   WBC 11.6 (H) 01/22/2019   NEUTROABS 12.3 (H) 01/16/2019   HGB 13.6 01/22/2019   HCT 41.5 01/22/2019   MCV 89.2 01/22/2019   PLT 317 01/22/2019     STUDIES: DG Chest 1 View  Result Date: 01/16/2019 CLINICAL DATA:  Short of breath. Follow-up chest tube. EXAM: CHEST  1 VIEW COMPARISON:  01/15/2019 FINDINGS: Left-sided chest tube is in place. No significant pneumothorax visualized. Decreased lung volumes. Pulmonary vascular congestion. Atelectasis noted in the left base. IMPRESSION: 1. No significant change in aeration to the left lung. 2. No significant pneumothorax noted. 3. Pulmonary vascular congestion. Electronically Signed   By: Kerby Moors M.D.   On: 01/16/2019 05:42   DG Chest 2 View  Result Date: 02/04/2019 CLINICAL DATA:  Chest pain.  Status post left lung mass removal. EXAM: CHEST - 2 VIEW COMPARISON:  January 24, 2019. FINDINGS: Stable cardiomegaly. No pneumothorax or pleural effusion is noted. Both lungs are clear. The visualized skeletal structures are unremarkable. Surgical staples are seen over the left lateral chest wall. IMPRESSION: No active cardiopulmonary disease. Electronically Signed   By: Marijo Conception M.D.   On:  02/04/2019 16:58   DG Chest 2 View  Result Date: 01/19/2019 CLINICAL DATA:  Postoperative check, left-sided chest tube in place. EXAM: CHEST - 2 VIEW COMPARISON:  01/18/2019 FINDINGS: Two left-sided chest tubes remain in place. Tiny left pneumothorax is noted along the periphery of the left chest. Chest tube position similar to prior study. Lobar airspace disease/collapse on the right as before. Probable small left effusion. Dilated bowel loops in the upper abdomen as before. Cardiomediastinal contours are stable. IMPRESSION: 1. Tiny left pneumothorax along the periphery of the left chest. Two left-sided chest tubes  remain in place. 2. Redemonstration of volume loss and or consolidation in the right lung base and dilated bowel loops in the upper abdomen. Electronically Signed   By: Zetta Bills M.D.   On: 01/19/2019 10:02   DG Chest 2 View  Result Date: 01/15/2019 CLINICAL DATA:  Postoperative EXAM: CHEST - 2 VIEW COMPARISON:  01/14/2019 FINDINGS: No significant change in examination status post left thoracotomy with left-sided chest tubes in position and no significant pneumothorax. Very low volume examination with mild diffuse interstitial opacity, likely bronchovascular crowding. No new airspace opacity. Mild cardiomegaly. Subcutaneous emphysema about the left chest wall. IMPRESSION: 1. No significant change in examination status post left thoracotomy with left-sided chest tubes in position and no significant pneumothorax. 2. Very low volume examination with mild diffuse interstitial opacity, likely bronchovascular crowding. No new airspace opacity. Electronically Signed   By: Eddie Candle M.D.   On: 01/15/2019 09:19   DG Abd 1 View  Result Date: 01/17/2019 CLINICAL DATA:  Abdominal distention. EXAM: ABDOMEN - 1 VIEW COMPARISON:  01/16/2019 FINDINGS: Moderate distension of the stomach is again noted. There is persistent gaseous distension of the colon. Decreased stool burden noted on today's  exam. IMPRESSION: Persistent gaseous distension of the large bowel loops with interval decrease in overall stool burden. Electronically Signed   By: Kerby Moors M.D.   On: 01/17/2019 06:05   DG Chest Port 1 View  Result Date: 01/24/2019 CLINICAL DATA:  Acute respiratory failure. EXAM: PORTABLE CHEST 1 VIEW COMPARISON:  01/22/2019 FINDINGS: 0543 hours. Low lung volumes. The cardio pericardial silhouette is enlarged. Interval increase and right base collapse/consolidation. The visualized bony structures of the thorax are intact. Gaseous bowel distention noted in the visualized upper abdomen. IMPRESSION: Low volume film with progressive opacity at the right base, likely atelectasis. Electronically Signed   By: Misty Stanley M.D.   On: 01/24/2019 08:36   DG Chest Port 1 View  Result Date: 01/22/2019 CLINICAL DATA:  LEFT chest tube EXAM: PORTABLE CHEST 1 VIEW COMPARISON:  Portable exam 0724 hours compared to 01/21/2019 FINDINGS: LEFT thoracostomy tube unchanged. Normal heart size, mediastinal contours, and pulmonary vascularity. Low lung volumes. Tiny LEFT pneumothorax. Minimal RIGHT basilar atelectasis. No infiltrate or pleural effusion. Osseous structures unremarkable. IMPRESSION: LEFT thoracostomy tube with tiny LEFT pneumothorax. Electronically Signed   By: Lavonia Dana M.D.   On: 01/22/2019 08:00   DG Chest Port 1 View  Result Date: 01/21/2019 CLINICAL DATA:  Recent left-sided thoracotomy and video bronchoscopy EXAM: PORTABLE CHEST 1 VIEW COMPARISON:  January 19, 2019 FINDINGS: Chest tubes on the left are unchanged in position. Minimal pneumothorax on the left laterally is again noted without tension component. Consolidation in the medial right base is stable. No new opacity evident. No appreciable pleural effusion. Heart size and pulmonary vascularity are normal. No adenopathy. No bone lesions. IMPRESSION: Stable minimal pneumothorax on the left laterally without tension component. No change in  chest tube positions. Consolidation medial right base is stable. No new opacity. Stable cardiac silhouette. Electronically Signed   By: Lowella Grip III M.D.   On: 01/21/2019 08:26   DG CHEST PORT 1 VIEW  Result Date: 01/18/2019 CLINICAL DATA:  Chest tube in place EXAM: PORTABLE CHEST 1 VIEW COMPARISON:  Two days ago FINDINGS: Low volume chest with interval lobar collapse at the right base. Stable heart size accentuated by low volumes. No edema, effusion, or pneumothorax. Large bore chest tube at the left base. IMPRESSION: Interval lobar collapse at the right base.  Lung volumes are low. No visible pneumothorax. Electronically Signed   By: Monte Fantasia M.D.   On: 01/18/2019 06:53   DG Chest Port 1 View  Result Date: 01/14/2019 CLINICAL DATA:  Postop thoracotomy, left. EXAM: PORTABLE CHEST 1 VIEW COMPARISON:  Preoperative radiograph 12/16/2018, CT 11/17/2018 FINDINGS: Two left-sided chest tubes in place, 1 directed towards the apex, 1 towards the base. No definite visualized pneumothorax. Subcutaneous emphysema about the left lateral chest wall with adjacent skin staples. Lung volumes are low. Upper normal heart size, accentuated by low lung volumes. Bronchovascular crowding without pulmonary edema. No focal abnormality in the right lung. IMPRESSION: 1. Two left-sided chest tubes in place. No definite pneumothorax. Subcutaneous emphysema about the left lateral chest wall. 2. Low lung volumes with bronchovascular crowding. Electronically Signed   By: Keith Rake M.D.   On: 01/14/2019 17:59   DG ABD ACUTE 2+V W 1V CHEST  Result Date: 01/25/2019 CLINICAL DATA:  Ileus.  Former smoker. EXAM: DG ABDOMEN ACUTE W/ 1V CHEST COMPARISON:  Chest radiograph-01/24/2019; abdominal radiograph-01/17/2019 FINDINGS: Grossly unchanged cardiac silhouette and mediastinal contours given persistently reduced lung volumes. Overall improved aeration of lung bases with persistent bibasilar opacities, left greater  than right. No new focal airspace opacities. No pleural effusion or pneumothorax. No evidence of edema. Redemonstrated marked gaseous distention of multiple loops of large and small bowel with index loop ascending colon measuring approximately 8.5 cm in diameter. No pneumoperitoneum, pneumatosis or portal venous gas. Skin staples again overlie the left upper abdomen. Postoperative change of the lower lumbar spine, incompletely evaluated. IMPRESSION: 1. Improved aeration of the lungs with persistent bibasilar opacities right greater than left, atelectasis versus infiltrate. 2. Grossly unchanged marked gaseous distention of the large and small bowel most suggestive of ileus though conceivably a distal colonic obstruction could have a similar appearance. Clinical correlation is advised. Electronically Signed   By: Sandi Mariscal M.D.   On: 01/25/2019 07:49   DG ABD ACUTE 2+V W 1V CHEST  Result Date: 01/24/2019 CLINICAL DATA:  Severe abdominal distention.  Ileus. EXAM: DG ABDOMEN ACUTE W/ 1V CHEST COMPARISON:  01/17/2019 FINDINGS: Frontal chest shows low volumes. The cardio pericardial silhouette is enlarged. Right base atelectasis or infiltrate noted. Small left pleural effusion. Upright film shows no intraperitoneal free air. Supine film shows markedly dilated gas-filled large and small bowel with air visible along the length of the dilated colon down to the rectum. Fusion hardware noted lower lumbar spine/lumbosacral junction. Status post left hip replacement. IMPRESSION: Diffuse marked gaseous dilatation of small bowel and colon with colonic gas visible down to the level of the rectum. Imaging features are most suggestive of severe ileus. Focal collapse/consolidation in the right lower lobe. Electronically Signed   By: Misty Stanley M.D.   On: 01/24/2019 11:55   DG Abd Portable 2V  Result Date: 01/16/2019 CLINICAL DATA:  Short of breath. Chest tube. EXAM: PORTABLE ABDOMEN - 2 VIEW COMPARISON:  10/30/2017  FINDINGS: Moderate distension of the stomach. There is diffuse gaseous distension of the colon with a moderate stool burden identified within the right colon and descending colon. No significant small bowel dilatation identified. Previous left hip arthroplasty and anterior disc fusion of the lower lumbar spine. IMPRESSION: 1. Moderate gaseous distension of the, nonspecific. This may reflect colonic ileus versus distal bowel obstruction. Clinical correlation advised. 2. Moderate stool burden within the right colon and descending colon. Electronically Signed   By: Kerby Moors M.D.   On: 01/16/2019 05:44   ECHOCARDIOGRAM  COMPLETE  Result Date: 01/16/2019   ECHOCARDIOGRAM REPORT   Patient Name:   THERMAN HUGHLETT Date of Exam: 01/16/2019 Medical Rec #:  [id number]        Height:       70.0 in Accession #:    [id number]       Weight:       234.5 lb Date of Birth:  [birth year]       BSA:          2.23 m Patient Age:    58 years         BP:           151/96 mmHg Patient Gender: M                HR:           126 bpm. Exam Location:  [facility] Procedure: 2D Echo Indications:     Atrial Fibrillation  History:         Patient has no prior history of Echocardiogram examinations.                  Risk Factors:Current Smoker and Hypertension.  Sonographer:     LTM Referring Phys:  [id number] Bing Neighbors OUMA Diagnosing Phys: Bartholome Bill MD  Sonographer Comments: Image acquisition challenging due to patient body habitus. IMPRESSIONS  1. Left ventricular ejection fraction, by visual estimation, is 55 to 60%. The left ventricle has hyperdynamic function. Left ventricular septal wall thickness was moderately increased. Moderately increased left ventricular posterior wall thickness. There is moderately increased left ventricular hypertrophy.  2. Left ventricular diastolic parameters are consistent with Grade I diastolic dysfunction (impaired relaxation).  3. Global right ventricle was not well visualized.The right ventricular  size is not well visualized. Right vetricular wall thickness was not assessed.  4. Left atrial size was mildly dilated.  5. Right atrial size was normal.  6. The mitral valve was not well visualized. Trace mitral valve regurgitation.  7. The tricuspid valve is not well visualized. Tricuspid valve regurgitation is mild.  8. The aortic valve was not well visualized. Aortic valve regurgitation is trivial.  9. The pulmonic valve was not assessed. Pulmonic valve regurgitation is not visualized. 10. The interatrial septum was not assessed. FINDINGS  Left Ventricle: Left ventricular ejection fraction, by visual estimation, is 55 to 60%. The left ventricle has hyperdynamic function. Moderately increased left ventricular posterior wall thickness. There is moderately increased left ventricular hypertrophy. Left ventricular diastolic parameters are consistent with Grade I diastolic dysfunction (impaired relaxation). Right Ventricle: The right ventricular size is not well visualized. Right vetricular wall thickness was not assessed. Global RV systolic function is was not well visualized. Left Atrium: Left atrial size was mildly dilated. Right Atrium: Right atrial size was normal in size Pericardium: There is no evidence of pericardial effusion. Mitral Valve: The mitral valve was not well visualized. Trace mitral valve regurgitation. Tricuspid Valve: The tricuspid valve is not well visualized. Tricuspid valve regurgitation is mild. Aortic Valve: The aortic valve was not well visualized. Aortic valve regurgitation is trivial. Aortic valve mean gradient measures 4.5 mmHg. Aortic valve peak gradient measures 7.5 mmHg. Aortic valve area, by VTI measures 2.27 cm. Pulmonic Valve: The pulmonic valve was not assessed. Pulmonic valve regurgitation is not visualized. Aorta: The aortic root is normal in size and structure. IAS/Shunts: The interatrial septum was not assessed.  LEFT VENTRICLE PLAX 2D LVIDd:         4.52 cm  Diastology  LVIDs:         3.30 cm  LV e' lateral:   11.10 cm/s LV PW:         1.23 cm  LV E/e' lateral: 7.2 LV IVS:        1.22 cm  LV e' medial:    6.85 cm/s LVOT diam:     2.10 cm  LV E/e' medial:  11.6 LV SV:         49 ml LV SV Index:   21.17 LVOT Area:     3.46 cm  RIGHT VENTRICLE RV S prime:     18.80 cm/s LEFT ATRIUM             Index LA diam:        2.90 cm 1.30 cm/m LA Vol (A2C):   50.4 ml 22.57 ml/m LA Vol (A4C):   45.6 ml 20.42 ml/m LA Biplane Vol: 47.8 ml 21.40 ml/m  AORTIC VALVE AV Area (Vmax):    2.51 cm AV Area (Vmean):   2.46 cm AV Area (VTI):     2.27 cm AV Vmax:           136.50 cm/s AV Vmean:          98.200 cm/s AV VTI:            0.174 m AV Peak Grad:      7.5 mmHg AV Mean Grad:      4.5 mmHg LVOT Vmax:         99.00 cm/s LVOT Vmean:        69.700 cm/s LVOT VTI:          0.114 m LVOT/AV VTI ratio: 0.66  AORTA Ao Root diam: 3.50 cm MITRAL VALVE MV Area (PHT): 7.99 cm            SHUNTS MV PHT:        27.55 msec          Systemic VTI:  0.11 m MV Decel Time: 95 msec             Systemic Diam: 2.10 cm MV E velocity: 79.55 cm/s 103 cm/s  Bartholome Bill MD Electronically signed by Bartholome Bill MD Signature Date/Time: 01/16/2019/10:38:55 AM    Final     ASSESSMENT: Pathologic stage IA2 squamous cell 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  PLAN:    1. 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  He had a complicated postoperative course, but is now recovering.  He is not quite back to his baseline.  Given his stage of his disease, he does not require adjuvant XRT or chemotherapy.  No intervention is needed at this time.  Patient has been instructed to keep follow-up with thoracic surgery as scheduled.  Return to clinic in 6 months with repeat imaging and further evaluation.   I spent a total of 25 minutes face-to-face with the patient and reviewing chart data of which greater than 50% of the visit was spent in  counseling and coordination of care as detailed above.  Patient expressed understanding and was in agreement with this plan. He also understands that He can call clinic at any time with any questions, concerns, or complaints.   Cancer Staging Squamous cell carcinoma of left lung ([place]) Staging form: Lung, AJCC 8th Edition - Clinical stage from 02/12/2019: Stage IA2 (cT1b, cN0, cM0) - Signed by [name], MD on 02/12/2019  [name], MD   02/12/2019 1:12 PM

## 2019-02-14 NOTE — Patient Instructions (Signed)
Thank you allowing the Chronic Care Management Team to be a part of your care! It was a pleasure speaking with you today!  CCM (Chronic Care Management) Team   Bryceson Grape RN, BSN Nurse Care Coordinator  [phone]  Catie [facility] PharmD  Clinical Pharmacist  [phone]  Eula Fried LCSW Clinical Social Worker [phone]  Goals Addressed            This Visit's Progress   . RNCM- I need to stay healthy for my granddaughter (pt-stated)       Current Barriers:  [name] Knowledge Deficits related to basic understanding of hypertension pathophysiology and self care management . Knowledge Deficits related to understanding of medications prescribed for management of hypertension . Patient continues to smoke 1.5 packs a day-reporting has almost completely quit . Exercise program limited by need of a new knee brace . New lung cancer diagnosis- 12/2018  Case Manager Clinical Goal(s):  [name] Over the next 90 days, patient will verbalize understanding of plan for hypertension management  Interventions:  . Discussed plans with patient for ongoing care management follow up and provided patient with direct contact information for care management team  . Patient reached out to state since he has started taking prednisone his blood sugar has been elevated.  [name] Collaborated with CCM Pharm D.  . Discussed carefully watching diet while on the prednisone, patient was drinking orange juice.  . Discussed side effects of prednisone and made patient aware if sugars remained elevated to please let us know.   Patient Self Care Activities:  . UNABLE to independently manage HTN as evidenced by reported headaches and high b/p numbers.  . Checks BP and records as discussed  Please see past updates related to this goal by clicking on the "Past Updates" button in the selected goal          The patient verbalized understanding of instructions provided today and declined a print copy of patient  instruction materials.   The patient has been provided with contact information for the care management team and has been advised to call with any health related questions or concerns.

## 2019-02-15 DIAGNOSIS — I10 Essential (primary) hypertension: Secondary | ICD-10-CM | POA: Diagnosis not present

## 2019-02-15 DIAGNOSIS — E1169 Type 2 diabetes mellitus with other specified complication: Secondary | ICD-10-CM | POA: Diagnosis not present

## 2019-02-15 DIAGNOSIS — R0602 Shortness of breath: Secondary | ICD-10-CM | POA: Diagnosis not present

## 2019-02-15 DIAGNOSIS — I4891 Unspecified atrial fibrillation: Secondary | ICD-10-CM | POA: Diagnosis not present

## 2019-02-15 DIAGNOSIS — Z87891 Personal history of nicotine dependence: Secondary | ICD-10-CM | POA: Diagnosis not present

## 2019-02-16 ENCOUNTER — Other Ambulatory Visit: Payer: Self-pay | Admitting: Family Medicine

## 2019-02-16 ENCOUNTER — Encounter: Payer: Self-pay | Admitting: *Deleted

## 2019-02-17 ENCOUNTER — Telehealth: Payer: Self-pay | Admitting: Pharmacist

## 2019-02-17 ENCOUNTER — Ambulatory Visit: Payer: Self-pay

## 2019-02-17 ENCOUNTER — Emergency Department
Admission: EM | Admit: 2019-02-17 | Discharge: 2019-02-17 | Disposition: A | Discharge status: Home / Self Care | Payer: Medicare Other | Attending: Emergency Medicine | Admitting: Emergency Medicine

## 2019-02-17 ENCOUNTER — Encounter: Payer: Self-pay | Admitting: Intensive Care

## 2019-02-17 ENCOUNTER — Telehealth: Payer: Self-pay

## 2019-02-17 ENCOUNTER — Other Ambulatory Visit: Payer: Self-pay

## 2019-02-17 ENCOUNTER — Emergency Department: Payer: Medicare Other

## 2019-02-17 DIAGNOSIS — K529 Noninfective gastroenteritis and colitis, unspecified: Secondary | ICD-10-CM | POA: Diagnosis not present

## 2019-02-17 DIAGNOSIS — Z85118 Personal history of other malignant neoplasm of bronchus and lung: Secondary | ICD-10-CM | POA: Diagnosis not present

## 2019-02-17 DIAGNOSIS — R1030 Lower abdominal pain, unspecified: Secondary | ICD-10-CM | POA: Diagnosis present

## 2019-02-17 DIAGNOSIS — N181 Chronic kidney disease, stage 1: Secondary | ICD-10-CM | POA: Insufficient documentation

## 2019-02-17 DIAGNOSIS — Z7984 Long term (current) use of oral hypoglycemic drugs: Secondary | ICD-10-CM | POA: Insufficient documentation

## 2019-02-17 DIAGNOSIS — Z79899 Other long term (current) drug therapy: Secondary | ICD-10-CM | POA: Diagnosis not present

## 2019-02-17 DIAGNOSIS — D122 Benign neoplasm of ascending colon: Secondary | ICD-10-CM | POA: Diagnosis not present

## 2019-02-17 DIAGNOSIS — R109 Unspecified abdominal pain: Secondary | ICD-10-CM | POA: Diagnosis not present

## 2019-02-17 DIAGNOSIS — E1122 Type 2 diabetes mellitus with diabetic chronic kidney disease: Secondary | ICD-10-CM | POA: Insufficient documentation

## 2019-02-17 DIAGNOSIS — Z87891 Personal history of nicotine dependence: Secondary | ICD-10-CM | POA: Diagnosis not present

## 2019-02-17 DIAGNOSIS — R197 Diarrhea, unspecified: Secondary | ICD-10-CM | POA: Diagnosis not present

## 2019-02-17 DIAGNOSIS — Z96642 Presence of left artificial hip joint: Secondary | ICD-10-CM | POA: Diagnosis not present

## 2019-02-17 DIAGNOSIS — I129 Hypertensive chronic kidney disease with stage 1 through stage 4 chronic kidney disease, or unspecified chronic kidney disease: Secondary | ICD-10-CM | POA: Diagnosis not present

## 2019-02-17 DIAGNOSIS — R112 Nausea with vomiting, unspecified: Secondary | ICD-10-CM | POA: Diagnosis not present

## 2019-02-17 HISTORY — DX: Malignant neoplasm of unspecified part of unspecified bronchus or lung: C34.90

## 2019-02-17 LAB — CBC
HCT: 43.8 % (ref 39.0–52.0)
Hemoglobin: 14.8 g/dL (ref 13.0–17.0)
MCH: 29.2 pg (ref 26.0–34.0)
MCHC: 33.8 g/dL (ref 30.0–36.0)
MCV: 86.6 fL (ref 80.0–100.0)
Platelets: 298 10*3/uL (ref 150–400)
RBC: 5.06 MIL/uL (ref 4.22–5.81)
RDW: 13.3 % (ref 11.5–15.5)
WBC: 17.9 10*3/uL — ABNORMAL HIGH (ref 4.0–10.5)
nRBC: 0 % (ref 0.0–0.2)

## 2019-02-17 LAB — URINALYSIS, COMPLETE (UACMP) WITH MICROSCOPIC
Bacteria, UA: NONE SEEN
Bilirubin Urine: NEGATIVE
Glucose, UA: NEGATIVE mg/dL
Hgb urine dipstick: NEGATIVE
Ketones, ur: NEGATIVE mg/dL
Leukocytes,Ua: NEGATIVE
Nitrite: NEGATIVE
Protein, ur: 100 mg/dL — AB
Specific Gravity, Urine: 1.025 (ref 1.005–1.030)
pH: 6 (ref 5.0–8.0)

## 2019-02-17 LAB — COMPREHENSIVE METABOLIC PANEL
ALT: 54 U/L — ABNORMAL HIGH (ref 0–44)
AST: 34 U/L (ref 15–41)
Albumin: 4.2 g/dL (ref 3.5–5.0)
Alkaline Phosphatase: 132 U/L — ABNORMAL HIGH (ref 38–126)
Anion gap: 14 (ref 5–15)
BUN: 15 mg/dL (ref 6–20)
CO2: 21 mmol/L — ABNORMAL LOW (ref 22–32)
Calcium: 9.2 mg/dL (ref 8.9–10.3)
Chloride: 100 mmol/L (ref 98–111)
Creatinine, Ser: 0.83 mg/dL (ref 0.61–1.24)
GFR calc Af Amer: >60 mL/min (ref >60)
GFR calc non Af Amer: >60 mL/min (ref >60)
Glucose, Bld: 167 mg/dL — ABNORMAL HIGH (ref 70–99)
Potassium: 3.3 mmol/L — ABNORMAL LOW (ref 3.5–5.1)
Sodium: 135 mmol/L (ref 135–145)
Total Bilirubin: 0.8 mg/dL (ref 0.3–1.2)
Total Protein: 7.7 g/dL (ref 6.5–8.1)

## 2019-02-17 LAB — LIPASE, BLOOD: Lipase: 50 U/L (ref 11–51)

## 2019-02-17 MED ORDER — IOHEXOL 300 MG/ML  SOLN
100.0000 mL | Freq: Once | INTRAMUSCULAR | Status: AC | PRN
  Administered 2019-02-17: 100 mL via INTRAVENOUS
  Filled 2019-02-17: qty 100

## 2019-02-17 MED ORDER — ONDANSETRON HCL 4 MG/2ML IJ SOLN
4.0000 mg | Freq: Once | INTRAMUSCULAR | Status: AC
  Administered 2019-02-17: 4 mg via INTRAVENOUS
  Filled 2019-02-17: qty 2

## 2019-02-17 MED ORDER — MORPHINE SULFATE (PF) 4 MG/ML IV SOLN
4.0000 mg | Freq: Once | INTRAVENOUS | Status: AC
  Administered 2019-02-17: 4 mg via INTRAVENOUS
  Filled 2019-02-17: qty 1

## 2019-02-17 MED ORDER — CIPROFLOXACIN HCL 500 MG PO TABS: 500.0000 mg | ORAL_TABLET | Freq: Two times a day (BID) | ORAL | 0 refills | Status: AC

## 2019-02-17 MED ORDER — METRONIDAZOLE 500 MG PO TABS: 500.0000 mg | ORAL_TABLET | Freq: Three times a day (TID) | ORAL | 0 refills | Status: AC

## 2019-02-17 MED ORDER — IOHEXOL 9 MG/ML PO SOLN
500.0000 mL | Freq: Once | ORAL | Status: AC | PRN
  Administered 2019-02-17: 1000 mL via ORAL
  Filled 2019-02-17: qty 500

## 2019-02-17 MED ORDER — METRONIDAZOLE 500 MG PO TABS
500.0000 mg | ORAL_TABLET | Freq: Once | ORAL | Status: AC
  Administered 2019-02-17: 500 mg via ORAL
  Filled 2019-02-17: qty 1

## 2019-02-17 MED ORDER — SODIUM CHLORIDE 0.9 % IV BOLUS
1000.0000 mL | Freq: Once | INTRAVENOUS | Status: AC
  Administered 2019-02-17: 1000 mL via INTRAVENOUS

## 2019-02-17 MED ORDER — CIPROFLOXACIN HCL 500 MG PO TABS
500.0000 mg | ORAL_TABLET | Freq: Once | ORAL | Status: AC
  Administered 2019-02-17: 500 mg via ORAL
  Filled 2019-02-17: qty 1

## 2019-02-17 NOTE — Telephone Encounter (Signed)
Contacted patient for CCM call, however, reported stabbing lower stomach pain since yesterday afternoon, accompanied by frequent watery diarrhea, unrelieved by tums or gas-ex.  Reviewed w/ Merrie Roof, PA as Dr. Wynetta Emery is off this afternoon. She recommended patient seek treatment at Urgent Care.   Called patient back, reviewed the above. He was reticent, as he doesn't want to [place], but agreed to go be evaluated.   Routing to PCP and clinical team

## 2019-02-17 NOTE — Chronic Care Management (AMB) (Signed)
  Chronic Care Management   Note  02/17/2019 Name: RICHARD RITCHEY MRN: [id number] DOB: Jan 15, 1961  [name] is a 58 y.o. year old male who is a primary care patient of [name], DO. The CCM team was consulted for assistance with chronic disease management and care coordination needs.    Contacted patient for chronic medication review, however, he noted a 1 day history of stabbing lower stomach pain, accompanied by frequent, watery diarrhea, and unresolved w/ Tums or Gas-ex. Collaborated w/ Merrie Roof, PA; recommended patient be evaluated in Urgent Care.   Relayed recommendation to patient. He noted understanding and that he would seek treatment.   Will attempt outreach again next week for medication review.   Catie Darnelle Maffucci, PharmD, [place] [phone]

## 2019-02-17 NOTE — ED Triage Notes (Signed)
Patient presents with abd pain with N/V/D since yesterday. Recently had left lung surgery November 12,2020 and reports having 2inches removed from lung for lung nodule and malignant neoplasm of lung

## 2019-02-17 NOTE — ED Provider Notes (Signed)
[facility] Emergency Department Provider Note  Time seen: 5:30 PM  I have reviewed the triage vital signs and the nursing notes.   HISTORY  Chief Complaint Abdominal Pain   HPI Kristopher [name] is a 58 y.o. male with a past medical history of chronic pain, diabetes, hypertension, recent left lower lobe resection of his lungs for possible cancer, presents to the emergency department for lower abdominal pain.  According to the patient little over a month ago he had a partial left lower lobe resection, states since that time he has been doing well until yesterday when he developed abdominal pain across the lower abdomen.  States he has had 8 or 9 episodes of diarrhea today and had an episode of nausea vomiting earlier this morning.  Denies any fever no cough or shortness of breath.  Patient states moderate dull pain across the lower abdomen worse with movement or palpation.  Denies any dysuria or urinary symptoms.   Past Medical History:  Diagnosis Date  . Allergy   . Arthritis    left foot  . Benign hypertensive kidney disease   . Chronic back pain    Four rods in back  . Diabetes mellitus, type 2 ([place])   . Dyspnea   . GERD (gastroesophageal reflux disease)   . Hypertension   . Malignant neoplasm of lung ([place])   . Migraines    daily    Patient Active Problem List   Diagnosis Date Noted  . Chronic upper extremity pain (Third area of Pain) (Bilateral) (L>R) 01/19/2019  . Squamous cell carcinoma of left lung ([place]) 01/16/2019  . Atrial fibrillation with RVR ([place])   . Shortness of breath   . Acute respiratory failure with hypoxia ([place])   . Lung mass 01/14/2019  . Left lower lobe pulmonary nodule 10/26/2018  . History of Allergy to amide type local anesthetic (Lidocaine) 10/21/2018    Class: History of  . Hyperlipidemia associated with type 2 diabetes mellitus ([place]) 07/20/2018  . History of colonic polyps   . Type 2 diabetes mellitus with stage 1 chronic  kidney disease, without long-term current use of insulin ([place]) 12/30/2017  . S/P hip replacement 12/02/2017  . Hip arthritis 10/01/2017  . Aortic atherosclerosis ([place]) 08/25/2017  . Left-sided weakness 08/13/2017  . Musculoskeletal pain, chronic 04/03/2017  . Tobacco abuse 06/24/2016  . GERD (gastroesophageal reflux disease) 06/24/2016  . Special screening for malignant neoplasms, colon   . Benign neoplasm of ascending colon   . Polyp of sigmoid colon   . Rectal polyp   . Benign hypertensive renal disease 01/22/2016  . Migraine 01/22/2016  . Chronic pain syndrome 01/11/2016  . Chronic sacroiliac joint pain (Bilateral) (L>R) 05/31/2015  . Allergy history, anesthetic (Unconfirmed allergy to Lidocaine) 05/31/2015  . Chronic hip pain (Left) 05/31/2015  . Lumbar facet syndrome (Location of Primary Source of Pain) (Bilateral) (L>R) 05/31/2015  . Chronic lower extremity pain (Secondary area of Pain) (Left) 05/31/2015  . Greater occipital neuralgia (Right) 05/31/2015  . Retrolisthesis of L5-S1 02/06/2015  . Cervical disc herniation (C4-5 and C5-6) 02/06/2015  . Lumbar disc herniation (L5-S1) 02/06/2015  . Hypokalemia 02/01/2015  . Cervical spinal stenosis (C4-5) 01/06/2015  . Cervical foraminal stenosis (Bilateral C5-6) 01/06/2015  . Chronic low back pain (Primary Area of Pain) (Bilateral) (L>R) 01/05/2015  . Lumbar spondylosis 01/05/2015  . Chronic lumbar radicular pain (Secondary area of Pain) (S1 dermatomal) (Left) 01/05/2015  . 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) 01/05/2015  .  Chronic neck pain (posterior midline) (Bilateral) (L>R) 01/05/2015  . Cervical spondylosis 01/05/2015  . Chronic cervical radicular pain (Third area of Pain) (Bilateral) (C5/C6 dermatome) (L>R) 01/05/2015  . Long term current use of opiate analgesic 01/05/2015  . Long term prescription opiate use 01/05/2015  . Opiate use (60 MME/Day) 01/05/2015  . Opioid dependence ([place]) 01/05/2015   . Encounter for therapeutic drug level monitoring 01/05/2015    Past Surgical History:  Procedure Laterality Date  . APPENDECTOMY    . BACK SURGERY    . COLONOSCOPY WITH PROPOFOL N/A 02/19/2016   Procedure: COLONOSCOPY WITH PROPOFOL;  Surgeon: Lucilla Lame, MD;  Location: [place];  Service: Endoscopy;  Laterality: N/A;  . COLONOSCOPY WITH PROPOFOL N/A 01/19/2018   Procedure: COLONOSCOPY WITH PROPOFOL;  Surgeon: Lucilla Lame, MD;  Location: [place];  Service: Endoscopy;  Laterality: N/A;  Diabetic - oral meds  . DG OPERATIVE LEFT HIP ([place] [facility])     10/19  . ELECTROMAGNETIC NAVIGATION BROCHOSCOPY Left 11/18/2018   Procedure: ELECTROMAGNETIC NAVIGATION BRONCHOSCOPY;  Surgeon: Tyler Pita, MD;  Location: [facility];  Service: Cardiopulmonary;  Laterality: Left;  . FLEXIBLE BRONCHOSCOPY Bilateral 01/20/2019   Procedure: FLEXIBLE BRONCHOSCOPY;  Surgeon: Ottie Glazier, MD;  Location: [facility];  Service: Thoracic;  Laterality: Bilateral;  . FLEXIBLE BRONCHOSCOPY Bilateral 01/22/2019   Procedure: FLEXIBLE BRONCHOSCOPY;  Surgeon: Ottie Glazier, MD;  Location: [facility];  Service: Thoracic;  Laterality: Bilateral;  . FOOT SURGERY Left    Screws and plates  . JOINT REPLACEMENT Left 12/2017   DR Rudene Christians Hip  . KNEE SURGERY Left    X 2  . LEG SURGERY    . LUNG CANCER SURGERY    . POLYPECTOMY N/A 02/19/2016   Procedure: POLYPECTOMY;  Surgeon: Lucilla Lame, MD;  Location: [place];  Service: Endoscopy;  Laterality: N/A;  . POLYPECTOMY  01/19/2018   Procedure: POLYPECTOMY;  Surgeon: Lucilla Lame, MD;  Location: [place];  Service: Endoscopy;;  . THORACOTOMY Left 01/14/2019   Procedure: THORACOTOMY MAJOR, LEFT;  Surgeon: Nestor Lewandowsky, MD;  Location: [facility];  Service: General;  Laterality: Left;  . TOTAL HIP ARTHROPLASTY Left 12/02/2017   Procedure: TOTAL HIP ARTHROPLASTY ANTERIOR APPROACH;  Surgeon: Hessie Knows, MD;  Location: [facility];  Service:  Orthopedics;  Laterality: Left;  [name] VIDEO BRONCHOSCOPY Left 01/14/2019   Procedure: VIDEO BRONCHOSCOPY WITH FLUORO, LEFT;  Surgeon: Nestor Lewandowsky, MD;  Location: [facility];  Service: General;  Laterality: Left;    Prior to Admission medications   Medication Sig Start Date End Date Taking? Authorizing Provider  ACCU-CHEK GUIDE test strip USE TO TEST BLOOD SUGAR 2X A DAY 02/16/19   Johnson, [name], DO  amiodarone (PACERONE) 200 MG tablet Take 1 tablet (200 mg total) by mouth 2 (two) times daily. 01/25/19   Nestor Lewandowsky, MD  amLODipine (NORVASC) 5 MG tablet Take 1 tablet (5 mg total) by mouth 2 (two) times daily. 01/27/19   Johnson, [name], DO  atorvastatin (LIPITOR) 10 MG tablet Take 1 tablet (10 mg total) by mouth daily at 6 PM. 01/25/19   Nestor Lewandowsky, MD  hydrALAZINE (APRESOLINE) 100 MG tablet Take 1 tablet (100 mg total) by mouth 2 (two) times daily. 01/25/19   Nestor Lewandowsky, MD  metFORMIN (GLUCOPHAGE) 500 MG tablet Take 500 mg by mouth 2 (two) times daily with a meal.    [provider]  metoprolol tartrate (LOPRESSOR) 50 MG tablet Take 1 tablet (50 mg total) by mouth 2 (  two) times daily. 01/25/19   Nestor Lewandowsky, MD  NONFORMULARY OR COMPOUNDED ITEM 10% Ketamine/2% Cyclobenzaprine/6% Gabapentin Cream Sig: 1-2 ml to affected area 3-4 times/day. Amount: 240 GM 02/04/19 04/17/19  [name], MD  oxyCODONE (OXY IR/ROXICODONE) 5 MG immediate release tablet Take 1 tablet (5 mg total) by mouth every 6 (six) hours as needed for moderate pain or severe pain. 01/25/19   Nestor Lewandowsky, MD  pantoprazole (PROTONIX) 40 MG tablet Take 1 tablet (40 mg total) by mouth daily. 01/26/19   Nestor Lewandowsky, MD  predniSONE (DELTASONE) 50 MG tablet Take 1 tablet (50 mg total) by mouth daily with breakfast. 02/08/19   Park Liter P, DO  simethicone (MYLICON) 80 MG chewable tablet Chew 1 tablet (80 mg total) by mouth every 6 (six) hours as needed for flatulence. 01/25/19   Nestor Lewandowsky, MD     Allergies  Allergen Reactions  . Aspirin Anaphylaxis  . Epinephrine Anaphylaxis    Does not include albuterol  . Lidocaine Anaphylaxis  . Novocain [Procaine] Anaphylaxis  . Penicillins Anaphylaxis    Has patient had a PCN reaction causing immediate rash, facial/tongue/throat swelling, SOB or lightheadedness with hypotension: Yes Has patient had a PCN reaction causing severe rash involving mucus membranes or skin necrosis: No Has patient had a PCN reaction that required hospitalization: Yes Has patient had a PCN reaction occurring within the last 10 years: No If all of the above answers are "NO", then may proceed with Cephalosporin use.   . Strawberry Extract Anaphylaxis  . Shellfish Allergy Hives and Nausea And Vomiting    Family History  Problem Relation Age of Onset  . Cancer Father   . Diabetes Sister   . Thrombosis Sister     Social History Social History   Tobacco Use  . Smoking status: Former Smoker    Packs/day: 0.00    Years: 35.00    Pack years: 0.00  . Smokeless tobacco: Never Used  Substance Use Topics  . Alcohol use: No    Alcohol/week: 0.0 standard drinks  . Drug use: Yes    Types: Oxycodone    Comment: prescribed    Review of Systems Constitutional: Negative for fever. Cardiovascular: Negative for chest pain. Respiratory: Negative for shortness of breath. Gastrointestinal: Moderate lower abdominal pain.  Positive for nausea vomiting diarrhea. Genitourinary: Negative for urinary compaints Musculoskeletal: Negative for musculoskeletal complaints Neurological: Negative for headache All other ROS negative  ____________________________________________   PHYSICAL EXAM:  VITAL SIGNS: ED Triage Vitals [02/17/19 1654]  Enc Vitals Group     BP (!) 149/100     Pulse Rate 95     Resp 18     Temp 99 F (37.2 C)     Temp Source Oral     SpO2 96 %     Weight 219 lb (99.3 kg)     Height _0  (1.778 m)     Head Circumference      Peak Flow       Pain Score 3     Pain Loc      Pain Edu?      Excl. in [place]?     Constitutional: Alert and oriented. Well appearing and in no distress. Eyes: Normal exam ENT      Head: Normocephalic and atraumatic.      Mouth/Throat: Mucous membranes are moist. Cardiovascular: Normal rate, regular rhythm.  Respiratory: Normal respiratory effort without tachypnea nor retractions. Breath sounds are clear  Gastrointestinal: Soft, moderate lower abdominal tenderness palpation  without rebound guarding or distention. Musculoskeletal: Nontender with normal range of motion in all extremities.  Neurologic:  Normal speech and language. No gross focal neurologic deficits  Skin:  Skin is warm, dry and intact.  Psychiatric: Mood and affect are normal.   ____________________________________________   INITIAL IMPRESSION / ASSESSMENT AND PLAN / ED COURSE  Pertinent labs & imaging results that were available during my care of the patient were reviewed by me and considered in my medical decision making (see chart for details).   Patient presents to the emergency department for lower abdominal pain since yesterday along with nausea vomiting diarrhea.  Patient has moderate tenderness across the lower abdomen.  Differential is quite broad would include colitis, diverticulitis, appendicitis.  Patient's labs show a significant leukocytosis of 17,900 otherwise largely at baseline.  Urinalysis is negative.  We will proceed with CT imaging to further evaluate.  Patient agreeable to plan of care.  CT scan shows a long segment of small bowel inflammation differential would include infectious enteritis versus ileitis versus IBD or ischemia.  Overall the patient appears well.  Given his white blood cell count elevation highly suspect infectious etiology.  We will place the patient on Augmentin we will have the patient follow-up with his PCP within the next 2 to 3 days for recheck/reevaluation.  I discussed return precautions for  any worsening pain or development of fever.  Patient agreeable.  Kristopher [name] was evaluated in Emergency Department on 02/17/2019 for the symptoms described in the history of present illness. He was evaluated in the context of the global COVID-19 pandemic, which necessitated consideration that the patient might be at risk for infection with the SARS-CoV-2 virus that causes COVID-19. Institutional protocols and algorithms that pertain to the evaluation of patients at risk for COVID-19 are in a state of rapid change based on information released by regulatory bodies including the CDC and federal and state organizations. These policies and algorithms were followed during the patient's care in the ED.  ____________________________________________   FINAL CLINICAL IMPRESSION(S) / ED DIAGNOSES  Abdominal pain   Harvest Dark, MD 02/17/19 1858

## 2019-02-17 NOTE — ED Notes (Signed)
Patient presents to the ED with abdominal pain.  Patient states he had a lung surgery approx. 1 month ago and states he has been having a lot of  diarrhea since the surgery.  Patient reports 8 bowel movements in the past 24 hours.  Patient reports vomiting and dry heaving x 1 this afternoon.  Patient is complaining of pain below his umbilical area and across his lower abdomen.  Patient states, "It feels like my stomach is going to blow up."

## 2019-02-24 ENCOUNTER — Encounter: Payer: Self-pay | Admitting: Pain Medicine

## 2019-02-24 ENCOUNTER — Ambulatory Visit (INDEPENDENT_AMBULATORY_CARE_PROVIDER_SITE_OTHER): Payer: Medicare Other | Admitting: Pharmacist

## 2019-02-24 DIAGNOSIS — I700 Atherosclerosis of aorta: Secondary | ICD-10-CM

## 2019-02-24 DIAGNOSIS — I10 Essential (primary) hypertension: Secondary | ICD-10-CM

## 2019-02-24 DIAGNOSIS — I4891 Unspecified atrial fibrillation: Secondary | ICD-10-CM

## 2019-02-24 NOTE — Telephone Encounter (Signed)
[facility] a sealed sample of the ropivacaine we use here and ask them to test the patient for it. If necessary place another referral for it.

## 2019-02-24 NOTE — Patient Instructions (Signed)
Visit Information  Goals Addressed            This Visit's Progress     Patient Stated   . PharmD "I don't like taking medications" (pt-stated)       Current Barriers:  . Polypharmacy; complex patient with multiple comorbidities including recent L thoracotomy and L lower lobectomy (admission 11/12-11/23); new onset Afib during hospitalization; hx tobacco abuse, HTN, migraines . ED visit for enteritis 02/17/2019. 14 day course of metronidazole and ciprofloxacin prescribed, notes that his stomach pain has resolved, but that he still doesn't feel "himself". Some "skin crawling" sensations, but no allergic-type issues noted  o T2DM: metformin 500 mg BID; Notes sugars have generally been well controlled, highest reading 172, but generally has been lower  o HTN: hx difficult to control BP; currently on amlodipine 5 mg BID, hydralazine 100 mg BID, metoprolol 50 mg BID; reports that over the past week, BPs 140s/90s; HR 90s; previously in 200s o Afib: post operative, outpt Holter monitoring revealed NSR. Anticoagulation deferred, as it was determined his afib was from a reversible cause. However, continues on amiodarone 200 mg daily per Dr. Saralyn Pilar, though patient has still been taking BID o ASCVD risk reduction: atorvastatin 10 mg daily o Chronic pain: nortriptyline 50 mg QPM; oxycodone IR 5 mg, Q4-6H PRN (taking 3-4 doses daily)  Pharmacist Clinical Goal(s):  [name] Over the next 90 days, patient will work with PharmD and provider towards optimized medication management  Interventions: . Comprehensive medication review performed; medication list updated in electronic medical record . Discussed goal BP. Encouraged patient to continue monitoring at home . Stressed the importance of completing the entire 14 day ABX course . Reviewed that Dr. Saralyn Pilar recommended once daily amiodarone 200 mg. Patient will reduce to daily from BID. Updated in our medication list.   Patient Self Care Activities:   . Patient will take medications as prescribed  Please see past updates related to this goal by clicking on the "Past Updates" button in the selected goal         The patient verbalized understanding of instructions provided today and declined a print copy of patient instruction materials.   Plan: - Scheduled telephonic outreach 04/07/19 @ 1:30 pm  Catie Darnelle Maffucci, PharmD, [place] [phone]

## 2019-02-24 NOTE — Telephone Encounter (Signed)
Their office has the [facility].if he is still interested in testing.

## 2019-02-24 NOTE — Chronic Care Management (AMB) (Signed)
Chronic Care Management   Follow Up Note   02/24/2019 Name: Kristopher [name] MRN: [id number] DOB: 1960/12/21  Referred by: [name], DO Reason for referral : Chronic Care Management (Medication Management)   Kristopher [name] is a 58 y.o. year old male who is a primary care patient of [name], DO. The CCM team was consulted for assistance with chronic disease management and care coordination needs.    Contacted patient for medication management review.   Review of patient status, including review of consultants reports, relevant laboratory and other test results, and collaboration with appropriate care team members and the patient's provider was performed as part of comprehensive patient evaluation and provision of chronic care management services.    SDOH (Social Determinants of Health) screening performed today: Physical Activity. See Care Plan for related entries.   Outpatient Encounter Medications as of 02/24/2019  Medication Sig Note  . amiodarone (PACERONE) 200 MG tablet Take 1 tablet (200 mg total) by mouth 2 (two) times daily. (Patient taking differently: Take 200 mg by mouth daily. Reduced to daily per Dr. Saralyn Pilar)   . amLODipine (NORVASC) 5 MG tablet Take 1 tablet (5 mg total) by mouth 2 (two) times daily.   [name] atorvastatin (LIPITOR) 10 MG tablet Take 1 tablet (10 mg total) by mouth daily at 6 PM.   . ciprofloxacin (CIPRO) 500 MG tablet Take 1 tablet (500 mg total) by mouth 2 (two) times daily for 14 days.   . hydrALAZINE (APRESOLINE) 100 MG tablet Take 1 tablet (100 mg total) by mouth 2 (two) times daily.   . metFORMIN (GLUCOPHAGE) 500 MG tablet Take 500 mg by mouth 2 (two) times daily with a meal.   . metoprolol tartrate (LOPRESSOR) 50 MG tablet Take 1 tablet (50 mg total) by mouth 2 (two) times daily.   . metroNIDAZOLE (FLAGYL) 500 MG tablet Take 1 tablet (500 mg total) by mouth 3 (three) times daily for 14 days.   . NONFORMULARY OR COMPOUNDED ITEM 10%  Ketamine/2% Cyclobenzaprine/6% Gabapentin Cream Sig: 1-2 ml to affected area 3-4 times/day. Amount: 240 GM   . oxyCODONE (OXY IR/ROXICODONE) 5 MG immediate release tablet Take 1 tablet (5 mg total) by mouth every 6 (six) hours as needed for moderate pain or severe pain. 02/24/2019: Taking 3-4 times daily   . pantoprazole (PROTONIX) 40 MG tablet Take 1 tablet (40 mg total) by mouth daily.   . [DISCONTINUED] simethicone (MYLICON) 80 MG chewable tablet Chew 1 tablet (80 mg total) by mouth every 6 (six) hours as needed for flatulence.   [name] ACCU-CHEK GUIDE test strip USE TO TEST BLOOD SUGAR 2X A DAY   . montelukast (SINGULAIR) 10 MG tablet Take 10 mg by mouth daily.   . nortriptyline (PAMELOR) 25 MG capsule Take 50 mg by mouth every evening.   . [DISCONTINUED] predniSONE (DELTASONE) 50 MG tablet Take 1 tablet (50 mg total) by mouth daily with breakfast.    Facility-Administered Encounter Medications as of 02/24/2019  Medication  . 0.9 %  sodium chloride infusion     Goals Addressed            This Visit's Progress     Patient Stated   . PharmD "I don't like taking medications" (pt-stated)       Current Barriers:  . Polypharmacy; complex patient with multiple comorbidities including recent L thoracotomy and L lower lobectomy (admission 11/12-11/23); new onset Afib during hospitalization; hx tobacco abuse, HTN, migraines . ED visit for enteritis  02/17/2019. 14 day course of metronidazole and ciprofloxacin prescribed, notes that his stomach pain has resolved, but that he still doesn't feel "himself". Some "skin crawling" sensations, but no allergic-type issues noted  o T2DM: metformin 500 mg BID; Notes sugars have generally been well controlled, highest reading 172, but generally has been lower  o HTN: hx difficult to control BP; currently on amlodipine 5 mg BID, hydralazine 100 mg BID, metoprolol 50 mg BID; reports that over the past week, BPs 140s/90s; HR 90s; previously in 200s o Afib: post  operative, outpt Holter monitoring revealed NSR. Anticoagulation deferred, as it was determined his afib was from a reversible cause. However, continues on amiodarone 200 mg daily per Dr. Saralyn Pilar, though patient has still been taking BID o ASCVD risk reduction: atorvastatin 10 mg daily o Chronic pain: nortriptyline 50 mg QPM; oxycodone IR 5 mg, Q4-6H PRN (taking 3-4 doses daily)  Pharmacist Clinical Goal(s):  [name] Over the next 90 days, patient will work with PharmD and provider towards optimized medication management  Interventions: . Comprehensive medication review performed; medication list updated in electronic medical record . Discussed goal BP. Encouraged patient to continue monitoring at home . Stressed the importance of completing the entire 14 day ABX course . Reviewed that Dr. Saralyn Pilar recommended once daily amiodarone 200 mg. Patient will reduce to daily from BID. Updated in our medication list.   Patient Self Care Activities:  . Patient will take medications as prescribed  Please see past updates related to this goal by clicking on the "Past Updates" button in the selected goal         Plan: - Scheduled telephonic outreach 04/07/19 @ 1:30 pm  Catie Darnelle Maffucci, PharmD, [place] [phone]

## 2019-02-24 NOTE — Telephone Encounter (Signed)
I am. I do not want any excuses that he is allergic to local anesthetics. I want him tested.

## 2019-03-12 ENCOUNTER — Other Ambulatory Visit: Payer: Self-pay | Admitting: Family Medicine

## 2019-03-16 ENCOUNTER — Telehealth: Payer: Self-pay

## 2019-03-19 ENCOUNTER — Encounter: Payer: Self-pay | Admitting: Physician Assistant

## 2019-03-19 ENCOUNTER — Other Ambulatory Visit: Payer: Self-pay

## 2019-03-19 ENCOUNTER — Ambulatory Visit
Admission: RE | Admit: 2019-03-19 | Discharge: 2019-03-19 | Disposition: A | Discharge status: Home / Self Care | Payer: Medicare Other | Source: Ambulatory Visit | Attending: Cardiothoracic Surgery | Admitting: Cardiothoracic Surgery

## 2019-03-19 ENCOUNTER — Ambulatory Visit (INDEPENDENT_AMBULATORY_CARE_PROVIDER_SITE_OTHER): Payer: Self-pay | Admitting: Physician Assistant

## 2019-03-19 ENCOUNTER — Ambulatory Visit
Admission: RE | Admit: 2019-03-19 | Discharge: 2019-03-19 | Disposition: A | Discharge status: Home / Self Care | Payer: Medicare Other | Attending: Cardiothoracic Surgery | Admitting: Cardiothoracic Surgery

## 2019-03-19 VITALS — BP 121/80 | HR 62 | Temp 97.5°F | Resp 12 | Ht 70.0 in | Wt 224.0 lb

## 2019-03-19 DIAGNOSIS — C3490 Malignant neoplasm of unspecified part of unspecified bronchus or lung: Secondary | ICD-10-CM

## 2019-03-19 DIAGNOSIS — Z09 Encounter for follow-up examination after completed treatment for conditions other than malignant neoplasm: Secondary | ICD-10-CM

## 2019-03-19 NOTE — Progress Notes (Signed)
[facility] POST-OP OFFICE VISIT  03/19/2019  HPI: Kristopher [name] is a 59 y.o. male 2 months s/p left thoracotomy with left lower lobectomy for carcinoma of the left lower lobe with Dr Genevive Bi. Has been seen by oncology (Dr Grayland Ormond) and recommend 6 month surveillance.    No complaints, no issues breathing, doing well.   Vital signs: BP 121/80   Pulse 62   Temp (!) 97.5 F (36.4 C) (Temporal)   Resp 12   Ht 5\' 10"  (1.778 m)   Wt 224 lb (101.6 kg)   SpO2 96%   BMI 32.14 kg/m    Physical Exam: Constitutional: Well appearing male, NAD Reespiratory: normal inspiratory effort, CTAB Skin: Thoracotomy incisions and chest tube sites are well healed  Assessment/Plan: This is a 60 y.o. male 2 months s/p left thoracotomy with left lower lobectomy   - 6 month surveillance  -- Edison Simon, PA-C [facility] 03/19/2019, 9:12 AM [phone] M-F: 7am - 4pm

## 2019-03-19 NOTE — Patient Instructions (Signed)
Please  follow up in 6 months July 2021 with your primary care physician Park Liter for 6 month surveillance for lung mass history.   Please call our office if you have any questions or concerns.

## 2019-04-02 ENCOUNTER — Other Ambulatory Visit: Payer: Self-pay | Admitting: Family Medicine

## 2019-04-02 NOTE — Telephone Encounter (Signed)
Requested medication (s) are due for refill today: yes  Requested medication (s) are on the active medication list:  yes  Last refill:  02/09/2019  Future visit scheduled: yes  Notes to clinic:  last filled by historical provider  Review for refill   Requested Prescriptions  Pending Prescriptions Disp Refills   nortriptyline (PAMELOR) 25 MG capsule [Pharmacy Med Name: NORTRIPTYLINE HCL 25 MG CAP] 60 capsule 0    Sig: TAKE 2 CAPSULES BY MOUTH AT BEDTIME      Psychiatry:  Antidepressants - Heterocyclics (TCAs) Passed - 04/02/2019 11:14 AM      Passed - Valid encounter within last 6 months    Recent Outpatient Visits           1 month ago [place]   [place], [name], DO   2 months ago Benign hypertensive renal disease   [facility] [place], [place], DO   3 months ago Cigarette smoker   [place], [place], [place]   5 months ago Type 2 diabetes mellitus with stage 1 chronic kidney disease, without long-term current use of insulin ([place])   [place], [name], DO   6 months ago Benign hypertensive renal disease   [facility] [place], Barb Merino, DO       Future Appointments             In 1 week Wynetta Emery, Barb Merino, DO [employer], [place]   In 3 weeks [name], MD [place]   In 4 months [place], Kathlene November, MD [facility]

## 2019-04-02 NOTE — Telephone Encounter (Signed)
Routing to provider  

## 2019-04-07 ENCOUNTER — Encounter: Payer: Self-pay | Admitting: Pharmacist

## 2019-04-07 ENCOUNTER — Ambulatory Visit (INDEPENDENT_AMBULATORY_CARE_PROVIDER_SITE_OTHER): Payer: Medicare Other | Admitting: Pharmacist

## 2019-04-07 DIAGNOSIS — N181 Chronic kidney disease, stage 1: Secondary | ICD-10-CM

## 2019-04-07 DIAGNOSIS — I10 Essential (primary) hypertension: Secondary | ICD-10-CM

## 2019-04-07 DIAGNOSIS — E1122 Type 2 diabetes mellitus with diabetic chronic kidney disease: Secondary | ICD-10-CM

## 2019-04-07 DIAGNOSIS — Z720 Tobacco use: Secondary | ICD-10-CM

## 2019-04-07 NOTE — Patient Instructions (Signed)
Visit Information  Goals Addressed            This Visit's Progress     Patient Stated   . PharmD "I don't like taking medications" (pt-stated)       Current Barriers:  . Polypharmacy; complex patient with multiple comorbidities including recent L thoracotomy and L lower lobectomy (admission 11/12-11/23); new onset Afib during hospitalization; hx tobacco abuse, HTN, migraines . Notes that he still needs a prescription knee brace o T2DM: metformin 500 mg BID; notes sugars remain well controlled, fastings ~110s;  o HTN: currently on amlodipine 5 mg BID, hydralazine 100 mg BID, metoprolol 50 mg BID; however, Fill Report shows fills for both metoprolol succinate (per Dr. Wynetta Emery) and tartrate (per Dr. Genevive Bi). Patient is unsure which he is taking, as he is not at home right now and his wife manages his medications. Reports recent BPS 120s/60s o Afib: post operative, outpt Holter monitoring revealed NSR. Anticoagulation deferred, as it was determined his afib was from a reversible cause. Follows w/ Dr. Clayborn Bigness o ASCVD risk reduction: atorvastatin 10 mg daily o Chronic pain: nortriptyline 50 mg QPM; oxycodone IR 5 mg, Q4-6H PRN (taking 3-4 doses daily)  Pharmacist Clinical Goal(s):  [name] Over the next 90 days, patient will work with PharmD and provider towards optimized medication management  Interventions: . Discussed metoprolol prescription clarification. He will be home with his wife tomorrow, and they will call me back with which type of metoprolol he is taking. Medication list will be updated accordingly.  . Will collaborate w/ RN CM regarding patient's need for knee brace  Patient Self Care Activities:  . Patient will take medications as prescribed  Please see past updates related to this goal by clicking on the "Past Updates" button in the selected goal      . PharmD "I need to quit smoking" (pt-stated)       Current Barriers:  . Hx tobacco abuse, recent diagnosis of lung cancer. Quit  for while he was hospitalized for thoracotomy, but once he was back home and "bored" he started smoking again o Currently smoking ~1/4 ppd, one pack is lasting ~4-5 days o Notes common triggers are meals o Has old prescription for nicotine patches and nicotine gum at home o Has been using hard candies to help curb cravings, using sugar free [employer]  . Previous quit attempts, unsuccessful w/ Chantix - notes extreme aggravation resulting in "going to the hospital because I punched a wall" . Denies smoking within 30 minutes of waking up . Reports motivation to quit smoking includes: his health, his family's health  Pharmacist Clinical Goal(s):  [name] Over the next 90 days, patient will work with PharmD and provider towards tobacco cessation  Interventions: . Praised for commitment to reduce tobacco use. Discussed strategies for continued reduction. Recommended he commit to using 1 piece of nicotine gum after meals, instead of immediately using a cigarette.  [place] for nicotine gum. He notes that he was not using this method and was having some stomach upset and bad taste in his mouth.  . If nicotine gum does not help, consider prescription for nicoderm 7 mg patch. . Patient will discuss at follow up w/ Dr. Wynetta Emery  Patient Self Care Activities: . Patient will commit to reducing tobacco consumption  Please see past updates related to this goal by clicking on the "Past Updates" button in the selected goal         The patient  verbalized understanding of instructions provided today and declined a print copy of patient instruction materials.   Plan:  - Will wait contact from patient/his wife regarding metoprolol clarification.  - Scheduled f/u call 05/26/19  Catie Darnelle Maffucci, PharmD, [place] [phone]

## 2019-04-07 NOTE — Chronic Care Management (AMB) (Signed)
Chronic Care Management   Follow Up Note   04/07/2019 Name: Kristopher [name] MRN: [id number] DOB: [birth year]  Referred by: [name], DO Reason for referral : Chronic Care Management (Medication Management)   Kristopher [name] is a 59 y.o. year old male who is a primary care patient of [name], DO. The CCM team was consulted for assistance with chronic disease management and care coordination needs.    Contacted patient for medication management review.   Review of patient status, including review of consultants reports, relevant laboratory and other test results, and collaboration with appropriate care team members and the patient's provider was performed as part of comprehensive patient evaluation and provision of chronic care management services.    SDOH (Social Determinants of Health) screening performed today: Tobacco Use. See Care Plan for related entries.   Outpatient Encounter Medications as of 04/07/2019  Medication Sig Note  . amiodarone (PACERONE) 200 MG tablet Take 1 tablet (200 mg total) by mouth 2 (two) times daily. (Patient taking differently: Take 200 mg by mouth daily. Reduced to daily per Dr. Saralyn Pilar)   . amLODipine (NORVASC) 5 MG tablet Take 1 tablet (5 mg total) by mouth 2 (two) times daily.   . hydrALAZINE (APRESOLINE) 100 MG tablet Take 1 tablet (100 mg total) by mouth 2 (two) times daily.   . metFORMIN (GLUCOPHAGE) 500 MG tablet Take 500 mg by mouth 2 (two) times daily with a meal.   . metoprolol tartrate (LOPRESSOR) 50 MG tablet Take 1 tablet (50 mg total) by mouth 2 (two) times daily.   . NONFORMULARY OR COMPOUNDED ITEM 10% Ketamine/2% Cyclobenzaprine/6% Gabapentin Cream Sig: 1-2 ml to affected area 3-4 times/day. Amount: 240 GM   . nortriptyline (PAMELOR) 25 MG capsule TAKE 2 CAPSULES BY MOUTH AT BEDTIME   . oxyCODONE (OXY IR/ROXICODONE) 5 MG immediate release tablet Take 1 tablet (5 mg total) by mouth every 6 (six) hours as needed for moderate  pain or severe pain. 04/07/2019: Variable use; some days using 1-2, some days worse   . ACCU-CHEK GUIDE test strip USE TO TEST BLOOD SUGAR 2X A DAY   . atorvastatin (LIPITOR) 10 MG tablet Take 1 tablet (10 mg total) by mouth daily at 6 PM.   . montelukast (SINGULAIR) 10 MG tablet Take 10 mg by mouth daily.   . pantoprazole (PROTONIX) 40 MG tablet Take 1 tablet (40 mg total) by mouth daily.    Facility-Administered Encounter Medications as of 04/07/2019  Medication  . 0.9 %  sodium chloride infusion     Objective:   Goals Addressed            This Visit's Progress     Patient Stated   . PharmD "I don't like taking medications" (pt-stated)       Current Barriers:  . Polypharmacy; complex patient with multiple comorbidities including recent L thoracotomy and L lower lobectomy (admission 11/12-11/23); new onset Afib during hospitalization; hx tobacco abuse, HTN, migraines . Notes that he still needs a prescription knee brace o T2DM: metformin 500 mg BID; notes sugars remain well controlled, fastings ~110s;  o HTN: currently on amlodipine 5 mg BID, hydralazine 100 mg BID, metoprolol 50 mg BID; however, Fill Report shows fills for both metoprolol succinate (per Dr. Wynetta Emery) and tartrate (per Dr. Genevive Bi). Patient is unsure which he is taking, as he is not at home right now and his wife manages his medications. Reports recent BPS 120s/60s o Afib: post operative, outpt Holter monitoring  revealed NSR. Anticoagulation deferred, as it was determined his afib was from a reversible cause. Follows w/ Dr. Clayborn Bigness o ASCVD risk reduction: atorvastatin 10 mg daily o Chronic pain: nortriptyline 50 mg QPM; oxycodone IR 5 mg, Q4-6H PRN (taking 3-4 doses daily)  Pharmacist Clinical Goal(s):  [name] Over the next 90 days, patient will work with PharmD and provider towards optimized medication management  Interventions: . Discussed metoprolol prescription clarification. He will be home with his wife tomorrow, and  they will call me back with which type of metoprolol he is taking. Medication list will be updated accordingly.  . Will collaborate w/ RN CM regarding patient's need for knee brace  Patient Self Care Activities:  . Patient will take medications as prescribed  Please see past updates related to this goal by clicking on the "Past Updates" button in the selected goal      . PharmD "I need to quit smoking" (pt-stated)       Current Barriers:  . Hx tobacco abuse, recent diagnosis of lung cancer. Quit for while he was hospitalized for thoracotomy, but once he was back home and "bored" he started smoking again o Currently smoking ~1/4 ppd, one pack is lasting ~4-5 days o Notes common triggers are meals o Has old prescription for nicotine patches and nicotine gum at home o Has been using hard candies to help curb cravings, using sugar free [employer]  . Previous quit attempts, unsuccessful w/ Chantix - notes extreme aggravation resulting in "going to the hospital because I punched a wall" . Denies smoking within 30 minutes of waking up . Reports motivation to quit smoking includes: his health, his family's health  Pharmacist Clinical Goal(s):  [name] Over the next 90 days, patient will work with PharmD and provider towards tobacco cessation  Interventions: . Praised for commitment to reduce tobacco use. Discussed strategies for continued reduction. Recommended he commit to using 1 piece of nicotine gum after meals, instead of immediately using a cigarette.  [place] for nicotine gum. He notes that he was not using this method and was having some stomach upset and bad taste in his mouth.  . If nicotine gum does not help, consider prescription for nicoderm 7 mg patch. . Patient will discuss at follow up w/ Dr. Wynetta Emery  Patient Self Care Activities: . Patient will commit to reducing tobacco consumption  Please see past updates related to this goal by clicking on the "Past  Updates" button in the selected goal          Plan:  - Will wait contact from patient/his wife regarding metoprolol clarification.  - Scheduled f/u call 05/26/19  Catie Darnelle Maffucci, PharmD, [place] [phone]

## 2019-04-13 ENCOUNTER — Ambulatory Visit: Payer: Medicare Other | Admitting: Family Medicine

## 2019-04-13 ENCOUNTER — Other Ambulatory Visit: Payer: Self-pay | Admitting: Family Medicine

## 2019-04-13 NOTE — Progress Notes (Deleted)
There were no vitals taken for this visit.   Subjective:    Patient ID: Kristopher [name], male    DOB: 1960/03/19, 59 y.o.   MRN: [id number]  HPI: Kristopher [name] is a 59 y.o. male  No chief complaint on file.  HYPERTENSION / HYPERLIPIDEMIA Satisfied with current treatment? {Blank single:19197::"yes","no"} Duration of hypertension: {Blank single:19197::"chronic","months","years"} BP monitoring frequency: {Blank single:19197::"not checking","rarely","daily","weekly","monthly","a few times a day","a few times a week","a few times a month"} BP range:  BP medication side effects: {Blank single:19197::"yes","no"} Past BP meds: {Blank KCLEXNTZ:00174::"BSWH","QPRFFMBWGY","KZLDJTTSVX/BLTJQZESPQ","ZRAQTMAU","QJFHLKTGYB","WLSLHTDSKA/JGOT","LXBWIOMBTD (bystolic)","carvedilol","chlorthalidone","clonidine","diltiazem","exforge HCT","HCTZ","irbesartan (avapro)","labetalol","lisinopril","lisinopril-HCTZ","losartan (cozaar)","methyldopa","nifedipine","olmesartan (benicar)","olmesartan-HCTZ","quinapril","ramipril","spironalactone","tekturna","valsartan","valsartan-HCTZ","verapamil"} Duration of hyperlipidemia: {Blank single:19197::"chronic","months","years"} Cholesterol medication side effects: {Blank single:19197::"yes","no"} Cholesterol supplements: {Blank multiple:19196::"none","fish oil","niacin","red yeast rice"} Past cholesterol medications: {Blank multiple:19196::"none","atorvastain (lipitor)","lovastatin (mevacor)","pravastatin (pravachol)","rosuvastatin (crestor)","simvastatin (zocor)","vytorin","fenofibrate (tricor)","gemfibrozil","ezetimide (zetia)","niaspan","lovaza"} Medication compliance: {Blank single:19197::"excellent compliance","good compliance","fair compliance","poor compliance"} Aspirin: {Blank single:19197::"yes","no"} Recent stressors: {Blank single:19197::"yes","no"} Recurrent headaches: {Blank single:19197::"yes","no"} Visual changes: {Blank  single:19197::"yes","no"} Palpitations: {Blank single:19197::"yes","no"} Dyspnea: {Blank single:19197::"yes","no"} Chest pain: {Blank single:19197::"yes","no"} Lower extremity edema: {Blank single:19197::"yes","no"} Dizzy/lightheaded: {Blank single:19197::"yes","no"}  DIABETES Hypoglycemic episodes:{Blank single:19197::"yes","no"} Polydipsia/polyuria: {Blank single:19197::"yes","no"} Visual disturbance: {Blank single:19197::"yes","no"} Chest pain: {Blank single:19197::"yes","no"} Paresthesias: {Blank single:19197::"yes","no"} Glucose Monitoring: {Blank single:19197::"yes","no"}  Accucheck frequency: {Blank single:19197::"Not Checking","Daily","BID","TID"}  Fasting glucose:  Post prandial:  Evening:  Before meals: Taking Insulin?: {Blank single:19197::"yes","no"}  Long acting insulin:  Short acting insulin: Blood Pressure Monitoring: {Blank single:19197::"not checking","rarely","daily","weekly","monthly","a few times a day","a few times a week","a few times a month"} Retinal Examination: {Blank single:19197::"Up to Date","Not up to Date"} Foot Exam: {Blank single:19197::"Up to Date","Not up to Date"} Diabetic Education: {Blank single:19197::"Completed","Not Completed"} Pneumovax: {Blank single:19197::"Up to Date","Not up to Date","unknown"} Influenza: {Blank single:19197::"Up to Date","Not up to Date","unknown"} Aspirin: {Blank single:19197::"yes","no"}  Relevant past medical, surgical, family and social history reviewed and updated as indicated. Interim medical history since our last visit reviewed. Allergies and medications reviewed and updated.  Review of Systems  Per HPI unless specifically indicated above     Objective:    There were no vitals taken for this visit.  Wt Readings from Last 3 Encounters:  03/19/19 224 lb (101.6 kg)  02/17/19 219 lb (99.3 kg)  02/12/19 222 lb 1.6 oz (100.7 kg)    Physical Exam  Results for orders placed or performed during the hospital  encounter of 02/17/19  Lipase, blood  Result Value Ref Range   Lipase 50 11 - 51 U/L  Comprehensive metabolic panel  Result Value Ref Range   Sodium 135 135 - 145 mmol/L   Potassium 3.3 (L) 3.5 - 5.1 mmol/L   Chloride 100 98 - 111 mmol/L   CO2 21 (L) 22 - 32 mmol/L   Glucose, Bld 167 (H) 70 - 99 mg/dL   BUN 15 6 - 20 mg/dL   Creatinine, Ser 0.83 0.61 - 1.24 mg/dL   Calcium 9.2 8.9 - 10.3 mg/dL   Total Protein 7.7 6.5 - 8.1 g/dL   Albumin 4.2 3.5 - 5.0 g/dL   AST 34 15 - 41 U/L   ALT 54 (H) 0 - 44 U/L   Alkaline Phosphatase 132 (H) 38 - 126 U/L   Total Bilirubin 0.8 0.3 - 1.2 mg/dL   GFR calc non Af Amer >60 >60 mL/min   GFR calc Af Amer >60 >60 mL/min   Anion gap 14 5 - 15  CBC  Result Value Ref Range   WBC 17.9 (H) 4.0 - 10.5 K/uL   RBC 5.06 4.22 - 5.81 MIL/uL   Hemoglobin 14.8 13.0 - 17.0 g/dL   HCT 43.8 39.0 - 52.0 %  MCV 86.6 80.0 - 100.0 fL   MCH 29.2 26.0 - 34.0 pg   MCHC 33.8 30.0 - 36.0 g/dL   RDW 13.3 11.5 - 15.5 %   Platelets 298 150 - 400 K/uL   nRBC 0.0 0.0 - 0.2 %  Urinalysis, Complete w Microscopic  Result Value Ref Range   Color, Urine YELLOW (A) YELLOW   APPearance HAZY (A) CLEAR   Specific Gravity, Urine 1.025 1.005 - 1.030   pH 6.0 5.0 - 8.0   Glucose, UA NEGATIVE NEGATIVE mg/dL   Hgb urine dipstick NEGATIVE NEGATIVE   Bilirubin Urine NEGATIVE NEGATIVE   Ketones, ur NEGATIVE NEGATIVE mg/dL   Protein, ur 100 (A) NEGATIVE mg/dL   Nitrite NEGATIVE NEGATIVE   Leukocytes,Ua NEGATIVE NEGATIVE   RBC / HPF 0-5 0 - 5 RBC/hpf   WBC, UA 0-5 0 - 5 WBC/hpf   Bacteria, UA NONE SEEN NONE SEEN   Squamous Epithelial / LPF 0-5 0 - 5   Mucus PRESENT    Hyaline Casts, UA PRESENT    Ca Oxalate Crys, UA PRESENT       Assessment & Plan:   Problem List Items Addressed This Visit      Endocrine   Type 2 diabetes mellitus with stage 1 chronic kidney disease, without long-term current use of insulin (HCC) - Primary   Hyperlipidemia associated with type 2  diabetes mellitus ([place])     Genitourinary   Benign hypertensive renal disease       Follow up plan: No follow-ups on file.

## 2019-04-13 NOTE — Telephone Encounter (Signed)
Has appointment this morning.

## 2019-04-13 NOTE — Telephone Encounter (Signed)
Requested medication (s) are due for refill today: no  Requested medication (s) are on the active medication list: yes  Last refill:  01/13/2019  Future visit scheduled: yes  Notes to clinic: review for refill Medication was d/c   Requested Prescriptions  Pending Prescriptions Disp Refills   metFORMIN (GLUCOPHAGE-XR) 500 MG 24 hr tablet [Pharmacy Med Name: METFORMIN HCL ER 500 MG TABLET] 360 tablet 0    Sig: TAKE 2 TABLETS (1,000 MG TOTAL) BY MOUTH 2 (TWO) TIMES DAILY WITH A MEAL.      Endocrinology:  Diabetes - Biguanides Passed - 04/13/2019  1:03 AM      Passed - Cr in normal range and within 360 days    Creatinine, Ser  Date Value Ref Range Status  02/17/2019 0.83 0.61 - 1.24 mg/dL Final          Passed - HBA1C is between 0 and 7.9 and within 180 days    HB A1C (BAYER DCA - WAIVED)  Date Value Ref Range Status  10/06/2018 5.8 <7.0 % Final    Comment:                                          Diabetic Adult            <7.0                                       Healthy Adult        4.3 - 5.7                                                           (DCCT/NGSP) American Diabetes Association's Summary of Glycemic Recommendations for Adults with Diabetes: Hemoglobin A1c <7.0%. More stringent glycemic goals (A1c <6.0%) may further reduce complications at the cost of increased risk of hypoglycemia.    Hgb A1c MFr Bld  Date Value Ref Range Status  01/16/2019 7.5 (H) 4.8 - 5.6 % Final    Comment:    (NOTE) Pre diabetes:          5.7%-6.4% Diabetes:              >6.4% Glycemic control for   <7.0% adults with diabetes           Passed - eGFR in normal range and within 360 days    GFR calc Af Amer  Date Value Ref Range Status  02/17/2019 >60 >60 mL/min Final   GFR calc non Af Amer  Date Value Ref Range Status  02/17/2019 >60 >60 mL/min Final          Passed - Valid encounter within last 6 months    Recent Outpatient Visits           2 months ago Cough   [place], [name], DO   2 months ago Benign hypertensive renal disease   [facility] [place], [place], DO   3 months ago Cigarette smoker   [place], [place], [place]   6 months ago Type 2 diabetes mellitus with stage 1 chronic kidney disease,  without long-term current use of insulin ([place])   [place], [place], DO   6 months ago Benign hypertensive renal disease   [place], Barb Merino, DO       Future Appointments             Today [name], DO [place], [place]   In 1 week [name], MD [place]   In 4 months [place], MD [facility]

## 2019-04-14 ENCOUNTER — Ambulatory Visit: Payer: Self-pay | Admitting: General Practice

## 2019-04-14 NOTE — Chronic Care Management (AMB) (Signed)
  Chronic Care Management   Outreach Note  04/14/2019 Name: OUSMANE SEEMAN MRN: [id number] DOB: 03-18-1960  Referred by: [name], DO Reason for referral : Chronic Care Management (Call to follow up on Chronic Disease Mangement and knee brace needs )   An unsuccessful telephone outreach was attempted today. The patient was referred to the case management team for assistance with care management and care coordination.   Follow Up Plan: A HIPPA compliant phone message was left for the patient providing contact information and requesting a return call.   Noreene Larsson RN, MSN, [facility] Mobile: [phone]

## 2019-04-20 ENCOUNTER — Encounter: Payer: Self-pay | Admitting: General Practice

## 2019-04-20 ENCOUNTER — Ambulatory Visit: Payer: Self-pay | Admitting: General Practice

## 2019-04-20 ENCOUNTER — Telehealth: Payer: Self-pay | Admitting: General Practice

## 2019-04-20 DIAGNOSIS — E785 Hyperlipidemia, unspecified: Secondary | ICD-10-CM

## 2019-04-20 DIAGNOSIS — F17210 Nicotine dependence, cigarettes, uncomplicated: Secondary | ICD-10-CM

## 2019-04-20 DIAGNOSIS — E1122 Type 2 diabetes mellitus with diabetic chronic kidney disease: Secondary | ICD-10-CM

## 2019-04-20 DIAGNOSIS — Z720 Tobacco use: Secondary | ICD-10-CM

## 2019-04-20 DIAGNOSIS — E1169 Type 2 diabetes mellitus with other specified complication: Secondary | ICD-10-CM

## 2019-04-20 DIAGNOSIS — I10 Essential (primary) hypertension: Secondary | ICD-10-CM

## 2019-04-20 DIAGNOSIS — R918 Other nonspecific abnormal finding of lung field: Secondary | ICD-10-CM

## 2019-04-20 DIAGNOSIS — N181 Chronic kidney disease, stage 1: Secondary | ICD-10-CM | POA: Diagnosis not present

## 2019-04-20 NOTE — Patient Instructions (Signed)
Visit Information  Goals Addressed            This Visit's Progress   . RNCM- I need to stay healthy for my granddaughter (pt-stated)       Current Barriers:  [name] Knowledge Deficits related to basic understanding of hypertension and hyperlipidemia  pathophysiology and self care management . Knowledge Deficits related to understanding of medications prescribed for management of hypertension and hyperlipidemia  . Patient continues to smoke 0.5 packs a day- patient has started back since his surgery and has patches. Knows he needs to quit completely . Exercise program limited by need of a new knee brace . New lung cancer diagnosis- surgery with removal of lung cancer- doing well post surgery. Does not need chemo or radiation  Case Manager Clinical Goal(s):  [name] Over the next 90 days, patient will verbalize understanding of plan for hypertension management, HLD management and management of chronic disease processes   Interventions:  . Discussed plans with patient for ongoing care management follow up and provided patient with direct contact information for care management team   . Evaluation of Heart healthy/ADA diet. The patient is drinking sodas, does not like diet. Education on the effects of foods high in salt and sugar in a patient with HTN and DM  . Evaluation of the knee brace the patient is requesting.  The patient was prescribed this knee brace in 2011 and it was in [place]. The patient to ask the pcp on virtual visit on Thursday about a prescription for knee brace. RNCM to do research to see if the knee brace can be found by local vendor . Review of blood pressure readings. The last reading was 135/98. Encouraged to monitor blood pressure and discuss treatment options with pcp . Care guide referral for help with dental questions and needs related to declined oral care due to chronic disease processes's . Education of the benefits of smoking cessation and working with the patient to reach a goal  of quitting    Patient Self Care Activities:  . UNABLE to independently manage HTN as evidenced by reported headaches and high b/p numbers.  . Checks BP and records as discussed . Unable to follow a hearth healthy/ADA diet   Please see past updates related to this goal by clicking on the "Past Updates" button in the selected goal          The patient verbalized understanding of instructions provided today and declined a print copy of patient instruction materials.   Telephone follow up appointment with care management team member scheduled for: 06-11-2019 at 10 am  [place], MSN, [facility] Mobile: [phone]

## 2019-04-20 NOTE — Chronic Care Management (AMB) (Signed)
Chronic Care Management   Follow Up Note   04/20/2019 Name: ADLEY MAZUROWSKI MRN: [id number] DOB: 1960-03-12  Referred by: [name], DO Reason for referral : Chronic Care Management ( Follow up:DM2/BP/Tobacco-use)   FARMER MCCAHILL is a 59 y.o. year old male who is a primary care patient of [name], DO. The CCM team was consulted for assistance with chronic disease management and care coordination needs.    Review of patient status, including review of consultants reports, relevant laboratory and other test results, and collaboration with appropriate care team members and the patient's provider was performed as part of comprehensive patient evaluation and provision of chronic care management services.    SDOH (Social Determinants of Health) screening performed today: Biomedical engineer  Food Insecurity  Tobacco Use Physical Activity. See Care Plan for related entries.   Outpatient Encounter Medications as of 04/20/2019  Medication Sig Note  . ACCU-CHEK GUIDE test strip USE TO TEST BLOOD SUGAR 2X A DAY   . amiodarone (PACERONE) 200 MG tablet Take 1 tablet (200 mg total) by mouth 2 (two) times daily. (Patient taking differently: Take 200 mg by mouth daily. Reduced to daily per Dr. Saralyn Pilar)   . amLODipine (NORVASC) 5 MG tablet Take 1 tablet (5 mg total) by mouth 2 (two) times daily.   [name] atorvastatin (LIPITOR) 10 MG tablet Take 1 tablet (10 mg total) by mouth daily at 6 PM.   . hydrALAZINE (APRESOLINE) 100 MG tablet Take 1 tablet (100 mg total) by mouth 2 (two) times daily.   . metFORMIN (GLUCOPHAGE) 500 MG tablet Take 500 mg by mouth 2 (two) times daily with a meal.   . metoprolol tartrate (LOPRESSOR) 50 MG tablet Take 1 tablet (50 mg total) by mouth 2 (two) times daily.   . montelukast (SINGULAIR) 10 MG tablet Take 10 mg by mouth daily.   . nortriptyline (PAMELOR) 25 MG capsule TAKE 2 CAPSULES BY MOUTH AT BEDTIME   . oxyCODONE (OXY IR/ROXICODONE) 5  MG immediate release tablet Take 1 tablet (5 mg total) by mouth every 6 (six) hours as needed for moderate pain or severe pain. 04/07/2019: Variable use; some days using 1-2, some days worse   . pantoprazole (PROTONIX) 40 MG tablet Take 1 tablet (40 mg total) by mouth daily.    Facility-Administered Encounter Medications as of 04/20/2019  Medication  . 0.9 %  sodium chloride infusion     Objective:  BP Readings from Last 3 Encounters:  03/19/19 121/80  02/17/19 (!) 149/100  02/12/19 (!) 160/97    Goals Addressed            This Visit's Progress   . RNCM- I need to stay healthy for my granddaughter (pt-stated)       Current Barriers:  [name] Knowledge Deficits related to basic understanding of hypertension and hyperlipidemia  pathophysiology and self care management . Knowledge Deficits related to understanding of medications prescribed for management of hypertension and hyperlipidemia  . Patient continues to smoke 0.5 packs a day- patient has started back since his surgery and has patches. Knows he needs to quit completely . Exercise program limited by need of a new knee brace . New lung cancer diagnosis- surgery with removal of lung cancer- doing well post surgery. Does not need chemo or radiation  Case Manager Clinical Goal(s):  [name] Over the next 90 days, patient will verbalize understanding of plan for hypertension management, HLD management and management of chronic disease processes  Interventions:  . Discussed plans with patient for ongoing care management follow up and provided patient with direct contact information for care management team   . Evaluation of Heart healthy/ADA diet. The patient is drinking sodas, does not like diet. Education on the effects of foods high in salt and sugar in a patient with HTN and DM  . Evaluation of the knee brace the patient is requesting.  The patient was prescribed this knee brace in 2011 and it was in [place]. The patient to ask the pcp on virtual  visit on Thursday about a prescription for knee brace. RNCM to do research to see if the knee brace can be found by local vendor . Review of blood pressure readings. The last reading was 135/98. Encouraged to monitor blood pressure and discuss treatment options with pcp . Care guide referral for help with dental questions and needs related to declined oral care due to chronic disease processes's . Education of the benefits of smoking cessation and working with the patient to reach a goal of quitting    Patient Self Care Activities:  . UNABLE to independently manage HTN as evidenced by reported headaches and high b/p numbers.  . Checks BP and records as discussed . Unable to follow a hearth healthy/ADA diet   Please see past updates related to this goal by clicking on the "Past Updates" button in the selected goal           Plan:   Telephone follow up appointment with care management team member scheduled for: 06-11-2019 at 10 am   [place], MSN, [facility] Mobile: [phone]

## 2019-04-21 ENCOUNTER — Encounter: Payer: Self-pay | Admitting: Pain Medicine

## 2019-04-22 ENCOUNTER — Encounter: Payer: Self-pay | Admitting: Family Medicine

## 2019-04-22 ENCOUNTER — Telehealth (INDEPENDENT_AMBULATORY_CARE_PROVIDER_SITE_OTHER): Payer: Medicare Other | Admitting: Family Medicine

## 2019-04-22 DIAGNOSIS — C3492 Malignant neoplasm of unspecified part of left bronchus or lung: Secondary | ICD-10-CM | POA: Diagnosis not present

## 2019-04-22 DIAGNOSIS — E1169 Type 2 diabetes mellitus with other specified complication: Secondary | ICD-10-CM

## 2019-04-22 DIAGNOSIS — E785 Hyperlipidemia, unspecified: Secondary | ICD-10-CM

## 2019-04-22 DIAGNOSIS — E1122 Type 2 diabetes mellitus with diabetic chronic kidney disease: Secondary | ICD-10-CM | POA: Diagnosis not present

## 2019-04-22 DIAGNOSIS — N181 Chronic kidney disease, stage 1: Secondary | ICD-10-CM | POA: Diagnosis not present

## 2019-04-22 DIAGNOSIS — I129 Hypertensive chronic kidney disease with stage 1 through stage 4 chronic kidney disease, or unspecified chronic kidney disease: Secondary | ICD-10-CM | POA: Diagnosis not present

## 2019-04-22 MED ORDER — METFORMIN HCL 500 MG PO TABS: 500.0000 mg | ORAL_TABLET | Freq: Two times a day (BID) | ORAL | 1 refills | Status: DC

## 2019-04-22 MED ORDER — METOPROLOL TARTRATE 50 MG PO TABS: 50.0000 mg | ORAL_TABLET | Freq: Two times a day (BID) | ORAL | 1 refills | Status: DC

## 2019-04-22 MED ORDER — HYDRALAZINE HCL 100 MG PO TABS: 100.0000 mg | ORAL_TABLET | Freq: Two times a day (BID) | ORAL | 1 refills | Status: DC

## 2019-04-22 MED ORDER — AMLODIPINE BESYLATE 5 MG PO TABS: 5.0000 mg | ORAL_TABLET | Freq: Two times a day (BID) | ORAL | 1 refills | Status: DC

## 2019-04-22 MED ORDER — ATORVASTATIN CALCIUM 10 MG PO TABS: 10.0000 mg | ORAL_TABLET | Freq: Every day | ORAL | 1 refills | Status: DC

## 2019-04-22 MED ORDER — NORTRIPTYLINE HCL 25 MG PO CAPS: 50.0000 mg | ORAL_CAPSULE | Freq: Every day | ORAL | 1 refills | Status: DC

## 2019-04-22 NOTE — Assessment & Plan Note (Signed)
Under good control on current regimen. Continue current regimen. Continue to monitor. Call with any concerns. Refills given. Labs to be drawn next week.

## 2019-04-22 NOTE — Progress Notes (Signed)
BP (!) 135/99   Pulse 62    Subjective:    Patient ID: Kristopher [name], male    DOB: 1960-10-13, 59 y.o.   MRN: [id number]  HPI: Kristopher [name] is a 59 y.o. male  Chief Complaint  Patient presents with  . Diabetes    Glucose 155  . Hyperlipidemia   HYPERTENSION / HYPERLIPIDEMIA Satisfied with current treatment? yes Duration of hypertension: chronic BP monitoring frequency: a few times a week BP medication side effects: no Duration of hyperlipidemia: chronic Cholesterol medication side effects: no Cholesterol supplements: none Past cholesterol medications: atorvastatin Medication compliance: excellent compliance Aspirin: no Recent stressors: yes Recurrent headaches: no Visual changes: no Palpitations: no Dyspnea: no Chest pain: no Lower extremity edema: no Dizzy/lightheaded: no  DIABETES Hypoglycemic episodes:no Polydipsia/polyuria: no Visual disturbance: no Chest pain: no Paresthesias: no Glucose Monitoring: no  Accucheck frequency: Daily Taking Insulin?: no Blood Pressure Monitoring: a few times a month Retinal Examination: Not up to Date Foot Exam: Not up to Date Diabetic Education: Completed Pneumovax: Up to Date Influenza: Not up to Date Aspirin: no   Relevant past medical, surgical, family and social history reviewed and updated as indicated. Interim medical history since our last visit reviewed. Allergies and medications reviewed and updated.  Review of Systems  Constitutional: Negative.   HENT: Negative.   Respiratory: Negative.   Cardiovascular: Negative.   Gastrointestinal: Positive for abdominal pain. Negative for abdominal distention, anal bleeding, blood in stool, constipation, diarrhea, nausea, rectal pain and vomiting.  Musculoskeletal: Positive for arthralgias, back pain and myalgias. Negative for gait problem, joint swelling, neck pain and neck stiffness.  Skin: Negative.   Neurological: Negative.   Psychiatric/Behavioral:  Negative.     Per HPI unless specifically indicated above     Objective:    BP (!) 135/99   Pulse 62   Wt Readings from Last 3 Encounters:  03/19/19 224 lb (101.6 kg)  02/17/19 219 lb (99.3 kg)  02/12/19 222 lb 1.6 oz (100.7 kg)    Physical Exam Vitals and nursing note reviewed.  Constitutional:      General: He is not in acute distress.    Appearance: Normal appearance. He is not ill-appearing, toxic-appearing or diaphoretic.  HENT:     Head: Normocephalic and atraumatic.     Right Ear: External ear normal.     Left Ear: External ear normal.     Nose: Nose normal.     Mouth/Throat:     Mouth: Mucous membranes are moist.     Pharynx: Oropharynx is clear.  Eyes:     General: No scleral icterus.       Right eye: No discharge.        Left eye: No discharge.     Conjunctiva/sclera: Conjunctivae normal.     Pupils: Pupils are equal, round, and reactive to light.  Pulmonary:     Effort: Pulmonary effort is normal. No respiratory distress.     Comments: Speaking in full sentences Musculoskeletal:        General: Normal range of motion.     Cervical back: Normal range of motion.  Skin:    Coloration: Skin is not jaundiced or pale.     Findings: No bruising, erythema, lesion or rash.  Neurological:     Mental Status: He is alert and oriented to person, place, and time. Mental status is at baseline.  Psychiatric:        Mood and Affect: Mood normal.  Behavior: Behavior normal.        Thought Content: Thought content normal.        Judgment: Judgment normal.     Results for orders placed or performed during the hospital encounter of 02/17/19  Lipase, blood  Result Value Ref Range   Lipase 50 11 - 51 U/L  Comprehensive metabolic panel  Result Value Ref Range   Sodium 135 135 - 145 mmol/L   Potassium 3.3 (L) 3.5 - 5.1 mmol/L   Chloride 100 98 - 111 mmol/L   CO2 21 (L) 22 - 32 mmol/L   Glucose, Bld 167 (H) 70 - 99 mg/dL   BUN 15 6 - 20 mg/dL   Creatinine, Ser  0.83 0.61 - 1.24 mg/dL   Calcium 9.2 8.9 - 10.3 mg/dL   Total Protein 7.7 6.5 - 8.1 g/dL   Albumin 4.2 3.5 - 5.0 g/dL   AST 34 15 - 41 U/L   ALT 54 (H) 0 - 44 U/L   Alkaline Phosphatase 132 (H) 38 - 126 U/L   Total Bilirubin 0.8 0.3 - 1.2 mg/dL   GFR calc non Af Amer >60 >60 mL/min   GFR calc Af Amer >60 >60 mL/min   Anion gap 14 5 - 15  CBC  Result Value Ref Range   WBC 17.9 (H) 4.0 - 10.5 K/uL   RBC 5.06 4.22 - 5.81 MIL/uL   Hemoglobin 14.8 13.0 - 17.0 g/dL   HCT 43.8 39.0 - 52.0 %   MCV 86.6 80.0 - 100.0 fL   MCH 29.2 26.0 - 34.0 pg   MCHC 33.8 30.0 - 36.0 g/dL   RDW 13.3 11.5 - 15.5 %   Platelets 298 150 - 400 K/uL   nRBC 0.0 0.0 - 0.2 %  Urinalysis, Complete w Microscopic  Result Value Ref Range   Color, Urine YELLOW (A) YELLOW   APPearance HAZY (A) CLEAR   Specific Gravity, Urine 1.025 1.005 - 1.030   pH 6.0 5.0 - 8.0   Glucose, UA NEGATIVE NEGATIVE mg/dL   Hgb urine dipstick NEGATIVE NEGATIVE   Bilirubin Urine NEGATIVE NEGATIVE   Ketones, ur NEGATIVE NEGATIVE mg/dL   Protein, ur 100 (A) NEGATIVE mg/dL   Nitrite NEGATIVE NEGATIVE   Leukocytes,Ua NEGATIVE NEGATIVE   RBC / HPF 0-5 0 - 5 RBC/hpf   WBC, UA 0-5 0 - 5 WBC/hpf   Bacteria, UA NONE SEEN NONE SEEN   Squamous Epithelial / LPF 0-5 0 - 5   Mucus PRESENT    Hyaline Casts, UA PRESENT    Ca Oxalate Crys, UA PRESENT       Assessment & Plan:   Problem List Items Addressed This Visit      Respiratory   Squamous cell carcinoma of left lung (HCC)    Continue to follow with thoracic surgery. Continue to monitor. Feeling much better. Call with any concerns.         Endocrine   Type 2 diabetes mellitus with stage 1 chronic kidney disease, without long-term current use of insulin ([place])    Feeling well. Due for recheck on his labs. Will come in and get them drawn early next week. Refills given. Continue to monitor. Call with any concerns.       Relevant Medications   metFORMIN (GLUCOPHAGE) 500 MG tablet    atorvastatin (LIPITOR) 10 MG tablet   Hyperlipidemia associated with type 2 diabetes mellitus ([place])    Under good control on current regimen. Continue current regimen. Continue to  monitor. Call with any concerns. Refills given. Labs to be drawn next week.        Relevant Medications   metFORMIN (GLUCOPHAGE) 500 MG tablet   atorvastatin (LIPITOR) 10 MG tablet     Genitourinary   Benign hypertensive renal disease    Under good control on current regimen. Continue current regimen. Continue to monitor. Call with any concerns. Refills given. Labs to be drawn next week.            Follow up plan: Return in about 3 months (around 07/20/2019).     . This visit was completed via mychart due to the restrictions of the COVID-19 pandemic. All issues as above were discussed and addressed. Physical exam was done as above through visual confirmation on mychart. If it was felt that the patient should be evaluated in the office, they were directed there. The patient verbally consented to this visit. . Location of the patient: home . Location of the provider: home . Those involved with this call:  . Provider: Park Liter, DO . CMA: Tiffany Reel, CMA . Front Desk/Registration: Don Perking  . Time spent on call: 35 minutes with patient face to face via video conference. More than 50% of this time was spent in counseling and coordination of care. 40 minutes total spent in review of patient's record and preparation of their chart.

## 2019-04-22 NOTE — Assessment & Plan Note (Signed)
Continue to follow with thoracic surgery. Continue to monitor. Feeling much better. Call with any concerns.

## 2019-04-22 NOTE — Assessment & Plan Note (Signed)
Feeling well. Due for recheck on his labs. Will come in and get them drawn early next week. Refills given. Continue to monitor. Call with any concerns.

## 2019-04-25 ENCOUNTER — Other Ambulatory Visit: Payer: Self-pay | Admitting: Family Medicine

## 2019-04-25 NOTE — Progress Notes (Addendum)
Patient: Kristopher [name]  Service Category: E/M  Provider: Gaspar Cola, MD  DOB: 15-Aug-1960  DOS: 04/26/2019  Location: Office  MRN: [id number]  Setting: Ambulatory outpatient  Referring Provider: [name], DO  Type: Established Patient  Specialty: Interventional Pain Management  PCP: [name], DO  Location: Remote location  Delivery: TeleHealth     Virtual Encounter - Pain Management PROVIDER NOTE: Information contained herein reflects review and annotations entered in association with encounter. Interpretation of such information and data should be left to medically-trained personnel. Information provided to patient can be located elsewhere in the medical record under "Patient Instructions". Document created using STT-dictation technology, any transcriptional errors that may result from process are unintentional.    Contact & Pharmacy Preferred: [phone] Home: [phone] (home) Mobile: [phone] (mobile) E-mail: No e-mail address on record  [id number] [id number]: [phone]: [phone] [id number]: [phone]: [phone]  Pre-screening  Mr. WSchappelloffered "in-person" vs "virtual" encounter. He indicated preferring virtual for this encounter.   Reason COVID-19*  Social distancing based on CDC and AMA recommendations.   I contacted MJason Coopon 04/26/2019 via telephone.      I clearly identified myself as FGaspar Cola MD. I verified that I was speaking with the correct person using two identifiers (Name: MCEASER EBELING and date of birth: 111-12-1960.  Consent I sought verbal advanced consent from MJason Coopfor virtual visit interactions. I informed Mr. WAzucenaof possible security and privacy concerns, risks, and limitations associated with  providing "not-in-person" medical evaluation and management services. I also informed Mr. WAlverioof the availability of "in-person" appointments. Finally, I informed him that there would be a charge for the virtual visit and that he could be  personally, fully or partially, financially responsible for it. Mr. WMatheniaexpressed understanding and agreed to proceed.   Historic Elements   Mr. MJAMMY PLOTKINis a 59y.o. year old, male patient evaluated today after his last contact with our practice on 02/02/2019. Mr. WAvey has a past medical history of Allergy, Arthritis, Benign hypertensive kidney disease, Chronic back pain, Diabetes mellitus, type 2 ([place], Dyspnea, GERD (gastroesophageal reflux disease), Hypertension, Malignant neoplasm of lung ([place], and Migraines. He also  has a past surgical history that includes Back surgery; Appendectomy; Leg Surgery; Knee surgery (Left); Foot surgery (Left); Colonoscopy with propofol (N/A, 02/19/2016); polypectomy (N/A, 02/19/2016); Total hip arthroplasty (Left, 12/02/2017); Colonoscopy with propofol (N/A, 01/19/2018); polypectomy (01/19/2018); DG OPERATIVE LEFT HIP [place] Joint replacement (Left, 12/2017); Video bronchoscopy (Left, 01/14/2019); Thoracotomy (Left, 01/14/2019); Flexible bronchoscopy (Bilateral, 01/20/2019); Flexible bronchoscopy (Bilateral, 01/22/2019); Electormagnetic navigation bronchoscopy (Left, 11/18/2018); and Lung cancer surgery. Mr. WDrummondshas a current medication list which includes the following prescription(s): accu-chek guide, amiodarone, amoxicillin, arnuity ellipta, montelukast, pantoprazole, potassium chloride er, accu-chek fastclix lancets, amlodipine, atorvastatin, hydralazine, metformin, metoprolol tartrate, NONFORMULARY OR COMPOUNDED ITEM, nortriptyline, oxycodone hcl, [START ON 05/27/2019] oxycodone hcl, [START ON 06/26/2019] oxycodone hcl, and sumatriptan, and the following Facility-Administered Medications: sodium chloride. He  reports  that he has been smoking cigarettes. He has a 1.75 pack-year smoking history. He has never used smokeless tobacco. He reports current drug use. Drug: Oxycodone. He reports that he does not drink alcohol. Mr. WCatois allergic to aspirin; epinephrine; novocain [procaine]; penicillins; strawberry extract;  and shellfish allergy.   HPI  Today, he is being contacted for medication management. Left thoracotomy and Left Lower Lobectomy by Nestor Lewandowsky, MD. Received Oxycodone 5 mg from Dr. Genevive Bi.  The patient indicates doing well with the current medication regimen. No adverse reactions or side effects reported to the medications.  Interestingly, the patient indicates that the cold weather seems to be helping with his pain.  Pharmacotherapy Assessment  Analgesic: Oxycodone IR 10 mg, 1 tab PO q 6 hrs (40 mg/day of oxycodone) + Compounded Specialty Analgesic Cream (w/ Ketamine) MME/day:60 mg/day.   Monitoring: [place] PMP: PDMP reviewed during this encounter.       Pharmacotherapy: No side-effects or adverse reactions reported. Compliance: No problems identified. Effectiveness: Clinically acceptable. Plan: Refer to "POC".  UDS:  Summary  Date Value Ref Range Status  01/20/2018 FINAL  Final    Comment:    ==================================================================== TOXASSURE SELECT 13 (MW) ==================================================================== Test                             Result       Flag       Units Drug Present   Oxycodone                      1855                    ng/mg creat   Oxymorphone                    685                     ng/mg creat   Noroxycodone                   1293                    ng/mg creat   Noroxymorphone                 103                     ng/mg creat    Sources of oxycodone are scheduled prescription medications.    Oxymorphone, noroxycodone, and noroxymorphone are expected    metabolites of oxycodone. Oxymorphone is also available as a     scheduled prescription medication. ==================================================================== Test                      Result    Flag   Units      Ref Range   Creatinine              96               mg/dL      >=20 ==================================================================== Declared Medications:  Medication list was not provided. ==================================================================== For clinical consultation, please call [phone]. ====================================================================    Laboratory Chemistry Profile   Renal Lab Results  Component Value Date   BUN 15 02/17/2019   CREATININE 0.83 02/17/2019   BCR 12 10/06/2018   GFRAA >60 02/17/2019   GFRNONAA >60 02/17/2019    Hepatic Lab Results  Component Value Date   AST 34 02/17/2019   ALT 54 (H) 02/17/2019   ALBUMIN 4.2 02/17/2019   ALKPHOS 132 (H) 02/17/2019   LIPASE 50 02/17/2019    Electrolytes Lab Results  Component Value Date  NA 135 02/17/2019   K 3.3 (L) 02/17/2019   CL 100 02/17/2019   CALCIUM 9.2 02/17/2019   MG 2.4 01/25/2019   PHOS 3.5 01/23/2019    Bone No results found for: VD25OH, VD125OH2TOT, QJ1941DE0, CX4481EH6, 25OHVITD1, 25OHVITD2, 25OHVITD3, TESTOFREE, TESTOSTERONE  Inflammation (CRP: Acute Phase) (ESR: Chronic Phase) Lab Results  Component Value Date   CRP 0.7 04/03/2015   ESRSEDRATE 11 10/21/2017   LATICACIDVEN 1.7 01/16/2019      Note: Above Lab results reviewed.  Imaging  DG Chest 2 View CLINICAL DATA:  Follow-up lung carcinoma. Previous left thoracotomy.  EXAM: CHEST - 2 VIEW  COMPARISON:  02/04/2019  FINDINGS: The heart size and mediastinal contours are within normal limits. Left chest wall skin staples have been removed. Mild residual atelectasis or scarring is seen in the left lung base. Lungs are otherwise clear. No evidence of pneumothorax or pleural effusion.  IMPRESSION: Mild residual left basilar  atelectasis or scarring.  Electronically Signed   By: Marlaine Hind M.D.   On: 03/19/2019 08:16  Assessment  The encounter diagnosis was Chronic pain syndrome.  Plan of Care  Problem-specific:  No problem-specific Assessment & Plan notes found for this encounter.  Mr. BERTRUM HELMSTETTER has a current medication list which includes the following long-term medication(s): amiodarone, pantoprazole, amlodipine, atorvastatin, hydralazine, metformin, metoprolol tartrate, nortriptyline, oxycodone hcl, [START ON 05/27/2019] oxycodone hcl, [START ON 06/26/2019] oxycodone hcl, and sumatriptan.  Pharmacotherapy (Medications Ordered): Meds ordered this encounter  Medications  . Oxycodone HCl 10 MG TABS    Sig: Take 1 tablet (10 mg total) by mouth every 6 (six) hours as needed. Must last 30 days    Dispense:  120 tablet    Refill:  0    Chronic Pain: STOP Act (Not applicable) Fill 1 day early if closed on refill date. Do not fill until: 04/27/2019. To last until: 05/27/2019. Avoid benzodiazepines within 8 hours of opioids  . Oxycodone HCl 10 MG TABS    Sig: Take 1 tablet (10 mg total) by mouth every 6 (six) hours as needed. Must last 30 days    Dispense:  120 tablet    Refill:  0    Chronic Pain: STOP Act (Not applicable) Fill 1 day early if closed on refill date. Do not fill until: 05/27/2019. To last until: 06/26/2019. Avoid benzodiazepines within 8 hours of opioids  . Oxycodone HCl 10 MG TABS    Sig: Take 1 tablet (10 mg total) by mouth every 6 (six) hours as needed. Must last 30 days    Dispense:  120 tablet    Refill:  0    Chronic Pain: STOP Act (Not applicable) Fill 1 day early if closed on refill date. Do not fill until: 06/26/2019. To last until: 07/26/2019. Avoid benzodiazepines within 8 hours of opioids  . DISCONTD: NONFORMULARY OR COMPOUNDED ITEM    Sig: 10% Ketamine/2% Cyclobenzaprine/6% Gabapentin Cream Sig: 1-2 ml to affected area 3-4 times/day. Amount: 240 GM    Dispense:  240 each     Refill:  6  . NONFORMULARY OR COMPOUNDED ITEM    Sig: Apply 1-2 mLs topically 4 (four) times daily as needed. 10% Ketamine/2% Cyclobenzaprine/6% Gabapentin Cream    Dispense:  240 each    Refill:  6    Please compound: Ketamine (10%) + Cyclobenzaprine (2%) + Gabapentin Cream (6%) into cream   Orders:  No orders of the defined types were placed in this encounter.  Follow-up plan:   Return in  about 13 weeks (around 07/26/2019) for (VV), (MM).      Interventional therapies: Planned, scheduled, and/or pending:   None at this time. Claims to have anaphylactic allergy to Local Anesthetics. He was sent for testing and shown/proven not to be allergic to Lidocaine.   Considering:   None at this time. Claims to have anaphylactic allergy to Local Anesthetics. He was sent for testing and shown/proven not to be allergic to Lidocaine.   Palliative PRN treatment(s):   None at this time. Claims to have anaphylactic allergy to Local Anesthetics. He was sent for testing and shown/proven not to be allergic to Lidocaine.    Recent Visits Date Type Provider Dept  04/26/19 Telemedicine [name], MD [facility]  Showing recent visits within past 90 days and meeting all other requirements   Future Appointments Date Type Provider Dept  07/26/19 Appointment [name], MD [facility]  Showing future appointments within next 90 days and meeting all other requirements   I discussed the assessment and treatment plan with the patient. The patient was provided an opportunity to ask questions and all were answered. The patient agreed with the plan and demonstrated an understanding of the instructions.  Patient advised to call back or seek an in-person evaluation if the symptoms or condition worsens.  Duration of encounter: 13 minutes.  Note by: Gaspar Cola, MD Date: 04/26/2019; Time: 8:16 AM

## 2019-04-26 ENCOUNTER — Other Ambulatory Visit: Payer: Self-pay | Admitting: Family Medicine

## 2019-04-26 ENCOUNTER — Other Ambulatory Visit: Payer: Self-pay

## 2019-04-26 ENCOUNTER — Ambulatory Visit: Payer: Medicare Other | Attending: Pain Medicine | Admitting: Pain Medicine

## 2019-04-26 DIAGNOSIS — G894 Chronic pain syndrome: Secondary | ICD-10-CM | POA: Diagnosis not present

## 2019-04-26 MED ORDER — SUMATRIPTAN SUCCINATE 50 MG PO TABS: ORAL_TABLET | ORAL | 0 refills | Status: DC

## 2019-04-26 MED ORDER — NONFORMULARY OR COMPOUNDED ITEM: 6 refills | Status: DC

## 2019-04-26 MED ORDER — OXYCODONE HCL 10 MG PO TABS: 10.0000 mg | ORAL_TABLET | Freq: Four times a day (QID) | ORAL | 0 refills | Status: DC | PRN

## 2019-04-26 NOTE — Patient Instructions (Signed)
____________________________________________________________________________________________  Medication Rules  Purpose: To inform patients, and their family members, of our rules and regulations.  Applies to: All patients receiving prescriptions (written or electronic).  Pharmacy of record: Pharmacy where electronic prescriptions will be sent. If written prescriptions are taken to a different pharmacy, please inform the nursing staff. The pharmacy listed in the electronic medical record should be the one where you would like electronic prescriptions to be sent.  Electronic prescriptions: In compliance with the [place] (STOP) Act of 2017 (Session Lanny Cramp [phone]), effective March 04, 2018, all controlled substances must be electronically prescribed. Calling prescriptions to the pharmacy will cease to exist.  Prescription refills: Only during scheduled appointments. Applies to all prescriptions.  NOTE: The following applies primarily to controlled substances (Opioid* Pain Medications).   Patient's responsibilities: 1. Pain Pills: Bring all pain pills to every appointment (except for procedure appointments). 2. Pill Bottles: Bring pills in original pharmacy bottle. Always bring the newest bottle. Bring bottle, even if empty. 3. Medication refills: You are responsible for knowing and keeping track of what medications you take and those you need refilled. The day before your appointment: write a list of all prescriptions that need to be refilled. The day of the appointment: give the list to the admitting nurse. Prescriptions will be written only during appointments. No prescriptions will be written on procedure days. If you forget a medication: it will not be "Called in", "Faxed", or "electronically sent". You will need to get another appointment to get these prescribed. No early refills. Do not call asking to have your prescription filled  early. 4. Prescription Accuracy: You are responsible for carefully inspecting your prescriptions before leaving our office. Have the discharge nurse carefully go over each prescription with you, before taking them home. Make sure that your name is accurately spelled, that your address is correct. Check the name and dose of your medication to make sure it is accurate. Check the number of pills, and the written instructions to make sure they are clear and accurate. Make sure that you are given enough medication to last until your next medication refill appointment. 5. Taking Medication: Take medication as prescribed. When it comes to controlled substances, taking less pills or less frequently than prescribed is permitted and encouraged. Never take more pills than instructed. Never take medication more frequently than prescribed.  6. Inform other Doctors: Always inform, all of your healthcare providers, of all the medications you take. 7. Pain Medication from other Providers: You are not allowed to accept any additional pain medication from any other Doctor or Healthcare provider. There are two exceptions to this rule. (see below) In the event that you require additional pain medication, you are responsible for notifying us, as stated below. 8. Medication Agreement: You are responsible for carefully reading and following our Medication Agreement. This must be signed before receiving any prescriptions from our practice. Safely store a copy of your signed Agreement. Violations to the Agreement will result in no further prescriptions. (Additional copies of our Medication Agreement are available upon request.) 9. Laws, Rules, & Regulations: All patients are expected to follow all Federal and [employer], Rules, [employer]. Ignorance of the Laws does not constitute a valid excuse.  10. Illegal drugs and Controlled Substances: The use of illegal substances (including, but not limited to marijuana and its  derivatives) and/or the illegal use of any controlled substances is strictly prohibited. Violation of this rule may result in the immediate and  permanent discontinuation of any and all prescriptions being written by our practice. The use of any illegal substances is prohibited. 11. Adopted CDC guidelines & recommendations: Target dosing levels will be at or below 60 MME/day. Use of benzodiazepines** is not recommended.  Exceptions: There are only two exceptions to the rule of not receiving pain medications from other Healthcare Providers. 1. Exception #1 (Emergencies): In the event of an emergency (i.e.: accident requiring emergency care), you are allowed to receive additional pain medication. However, you are responsible for: As soon as you are able, call our office [phone], at any time of the day or night, and leave a message stating your name, the date and nature of the emergency, and the name and dose of the medication prescribed. In the event that your call is answered by a member of our staff, make sure to document and save the date, time, and the name of the person that took your information.  2. Exception #2 (Planned Surgery): In the event that you are scheduled by another doctor or dentist to have any type of surgery or procedure, you are allowed (for a period no longer than 30 days), to receive additional pain medication, for the acute post-op pain. However, in this case, you are responsible for picking up a copy of our "Post-op Pain Management for Surgeons" handout, and giving it to your surgeon or dentist. This document is available at our office, and does not require an appointment to obtain it. Simply go to our office during business hours (Monday-Thursday from 8:00 AM to 4:00 PM) (Friday 8:00 AM to 12:00 Noon) or if you have a scheduled appointment with [place], prior to your surgery, and ask for it by name. In addition, you will need to provide [place] with your name, name of your surgeon, type of  surgery, and date of procedure or surgery.  *Opioid medications include: morphine, codeine, oxycodone, oxymorphone, hydrocodone, hydromorphone, meperidine, tramadol, tapentadol, buprenorphine, fentanyl, methadone. **Benzodiazepine medications include: diazepam (Valium), alprazolam (Xanax), clonazepam (Klonopine), lorazepam (Ativan), clorazepate (Tranxene), chlordiazepoxide (Librium), estazolam (Prosom), oxazepam (Serax), temazepam (Restoril), triazolam (Halcion) (Last updated: 05/01/2017) ____________________________________________________________________________________________   ____________________________________________________________________________________________  Medication Recommendations and Reminders  Applies to: All patients receiving prescriptions (written and/or electronic).  Medication Rules & Regulations: These rules and regulations exist for your safety and that of others. They are not flexible and neither are we. Dismissing or ignoring them will be considered "non-compliance" with medication therapy, resulting in complete and irreversible termination of such therapy. (See document titled "Medication Rules" for more details.) In all conscience, because of safety reasons, we cannot continue providing a therapy where the patient does not follow instructions.  Pharmacy of record:   Definition: This is the pharmacy where your electronic prescriptions will be sent.   We do not endorse any particular pharmacy.  You are not restricted in your choice of pharmacy.  The pharmacy listed in the electronic medical record should be the one where you want electronic prescriptions to be sent.  If you choose to change pharmacy, simply notify our nursing staff of your choice of new pharmacy.  Recommendations:  Keep all of your pain medications in a safe place, under lock and key, even if you live alone.   After you fill your prescription, take 1 week's worth of pills and put them  away in a safe place. You should keep a separate, properly labeled bottle for this purpose. The remainder should be kept in the original bottle. Use this as your primary supply,  until it runs out. Once it's gone, then you know that you have 1 week's worth of medicine, and it is time to come in for a prescription refill. If you do this correctly, it is unlikely that you will ever run out of medicine.  To make sure that the above recommendation works, it is very important that you make sure your medication refill appointments are scheduled at least 1 week before you run out of medicine. To do this in an effective manner, make sure that you do not leave the office without scheduling your next medication management appointment. Always ask the nursing staff to show you in your prescription , when your medication will be running out. Then arrange for the receptionist to get you a return appointment, at least 7 days before you run out of medicine. Do not wait until you have 1 or 2 pills left, to come in. This is very poor planning and does not take into consideration that we may need to cancel appointments due to bad weather, sickness, or emergencies affecting our staff.  "Partial Fill": If for any reason your pharmacy does not have enough pills/tablets to completely fill or refill your prescription, do not allow for a "partial fill". You will need a separate prescription to fill the remaining amount, which we will not provide. If the reason for the partial fill is your insurance, you will need to talk to the pharmacist about payment alternatives for the remaining tablets, but again, do not accept a partial fill.  Prescription refills and/or changes in medication(s):   Prescription refills, and/or changes in dose or medication, will be conducted only during scheduled medication management appointments. (Applies to both, written and electronic prescriptions.)  No refills on procedure days. No medication will be  changed or started on procedure days. No changes, adjustments, and/or refills will be conducted on a procedure day. Doing so will interfere with the diagnostic portion of the procedure.  No phone refills. No medications will be "called into the pharmacy".  No Fax refills.  No weekend refills.  No Holliday refills.  No after hours refills.  Remember:  Business hours are:  Monday to Thursday 8:00 AM to 4:00 PM Provider's Schedule: [name], MD - Appointments are:  Medication management: Monday and Wednesday 8:00 AM to 4:00 PM Procedure day: Tuesday and Thursday 7:30 AM to 4:00 PM Gillis Santa, MD - Appointments are:  Medication management: Tuesday and Thursday 8:00 AM to 4:00 PM Procedure day: Monday and Wednesday 7:30 AM to 4:00 PM (Last update: 05/01/2017) ____________________________________________________________________________________________

## 2019-04-26 NOTE — Telephone Encounter (Signed)
Received fax from pharmacy stating pt it requesting new Rx for Sumatriptan.

## 2019-04-26 NOTE — Telephone Encounter (Signed)
Routing to provider  In the future is this something that I can go ahead and approve if the system will allow me to?

## 2019-04-27 ENCOUNTER — Other Ambulatory Visit: Payer: Medicare Other

## 2019-04-27 ENCOUNTER — Other Ambulatory Visit: Payer: Self-pay

## 2019-04-27 DIAGNOSIS — C3492 Malignant neoplasm of unspecified part of left bronchus or lung: Secondary | ICD-10-CM

## 2019-04-27 DIAGNOSIS — N181 Chronic kidney disease, stage 1: Secondary | ICD-10-CM

## 2019-04-27 DIAGNOSIS — I129 Hypertensive chronic kidney disease with stage 1 through stage 4 chronic kidney disease, or unspecified chronic kidney disease: Secondary | ICD-10-CM | POA: Diagnosis not present

## 2019-04-27 DIAGNOSIS — E785 Hyperlipidemia, unspecified: Secondary | ICD-10-CM | POA: Diagnosis not present

## 2019-04-27 DIAGNOSIS — E1122 Type 2 diabetes mellitus with diabetic chronic kidney disease: Secondary | ICD-10-CM | POA: Diagnosis not present

## 2019-04-27 DIAGNOSIS — E1169 Type 2 diabetes mellitus with other specified complication: Secondary | ICD-10-CM | POA: Diagnosis not present

## 2019-04-27 LAB — MICROALBUMIN, URINE WAIVED
Creatinine, Urine Waived: 100 mg/dL (ref 10–300)
Microalb, Ur Waived: 150 mg/L — ABNORMAL HIGH (ref 0–19)

## 2019-04-27 LAB — BAYER DCA HB A1C WAIVED: HB A1C (BAYER DCA - WAIVED): 7.2 % — ABNORMAL HIGH (ref <7.0)

## 2019-04-27 MED ORDER — NONFORMULARY OR COMPOUNDED ITEM: 1.0000 mL | Freq: Four times a day (QID) | 6 refills | Status: DC | PRN

## 2019-04-27 NOTE — Addendum Note (Signed)
Addended by: [name] A on: 04/27/2019 08:15 AM   Modules accepted: Orders

## 2019-04-28 LAB — COMPREHENSIVE METABOLIC PANEL
ALT: 69 IU/L — ABNORMAL HIGH (ref 0–44)
AST: 43 IU/L — ABNORMAL HIGH (ref 0–40)
Albumin/Globulin Ratio: 1.6 (ref 1.2–2.2)
Albumin: 4.5 g/dL (ref 3.8–4.9)
Alkaline Phosphatase: 129 IU/L — ABNORMAL HIGH (ref 39–117)
BUN/Creatinine Ratio: 15 (ref 9–20)
BUN: 11 mg/dL (ref 6–24)
Bilirubin Total: 0.3 mg/dL (ref 0.0–1.2)
CO2: 27 mmol/L (ref 20–29)
Calcium: 9.3 mg/dL (ref 8.7–10.2)
Chloride: 100 mmol/L (ref 96–106)
Creatinine, Ser: 0.74 mg/dL — ABNORMAL LOW (ref 0.76–1.27)
GFR calc Af Amer: 118 mL/min/{1.73_m2} (ref >59)
GFR calc non Af Amer: 102 mL/min/{1.73_m2} (ref >59)
Globulin, Total: 2.8 g/dL (ref 1.5–4.5)
Glucose: 208 mg/dL — ABNORMAL HIGH (ref 65–99)
Potassium: 3.7 mmol/L (ref 3.5–5.2)
Sodium: 141 mmol/L (ref 134–144)
Total Protein: 7.3 g/dL (ref 6.0–8.5)

## 2019-04-28 LAB — CBC WITH DIFFERENTIAL/PLATELET
Basophils Absolute: 0.1 10*3/uL (ref 0.0–0.2)
Basos: 1 %
EOS (ABSOLUTE): 0.1 10*3/uL (ref 0.0–0.4)
Eos: 1 %
Hematocrit: 39.9 % (ref 37.5–51.0)
Hemoglobin: 13.3 g/dL (ref 13.0–17.7)
Immature Grans (Abs): 0.1 10*3/uL (ref 0.0–0.1)
Immature Granulocytes: 1 %
Lymphocytes Absolute: 3.4 10*3/uL — ABNORMAL HIGH (ref 0.7–3.1)
Lymphs: 40 %
MCH: 30 pg (ref 26.6–33.0)
MCHC: 33.3 g/dL (ref 31.5–35.7)
MCV: 90 fL (ref 79–97)
Monocytes Absolute: 0.5 10*3/uL (ref 0.1–0.9)
Monocytes: 6 %
Neutrophils Absolute: 4.3 10*3/uL (ref 1.4–7.0)
Neutrophils: 51 %
Platelets: 211 10*3/uL (ref 150–450)
RBC: 4.43 x10E6/uL (ref 4.14–5.80)
RDW: 13.5 % (ref 11.6–15.4)
WBC: 8.5 10*3/uL (ref 3.4–10.8)

## 2019-04-28 LAB — LIPID PANEL W/O CHOL/HDL RATIO
Cholesterol, Total: 155 mg/dL (ref 100–199)
HDL: 34 mg/dL — ABNORMAL LOW (ref >39)
LDL Chol Calc (NIH): 77 mg/dL (ref 0–99)
Triglycerides: 267 mg/dL — ABNORMAL HIGH (ref 0–149)
VLDL Cholesterol Cal: 44 mg/dL — ABNORMAL HIGH (ref 5–40)

## 2019-05-12 ENCOUNTER — Other Ambulatory Visit: Payer: Self-pay | Admitting: Family Medicine

## 2019-05-12 NOTE — Telephone Encounter (Signed)
Called and spoke with patient. Patient stated that he does not know what dose he has been taking as his wife takes care of his medications and she wasn't home. Patient stated he will as wife and and let us know anything.

## 2019-05-12 NOTE — Telephone Encounter (Signed)
Requested medications are due for refill today?    Uncertain as this medication strength is not on patient's active medication list.    Requested medications are on active medication list? Not this form or strength  Last Refill:     Future visit scheduled?  Yes  Notes to Clinic:  Metoprolol Succ ER 100 mg Tab is not on patient's active medication list.   Patient has Metoprolol Tartrate 50 mg BID on active med list which was just filled on 04/22/2019 # 90 with 1 refill.

## 2019-05-12 NOTE — Telephone Encounter (Signed)
Returned call to patient and spoke with patient's wife. She stated that patient has been taking the metoprolol 100 mg 1 tab twice a day and metoprolol 25 mg 1 tab twice a day. Two separate Rx

## 2019-05-12 NOTE — Telephone Encounter (Signed)
Routing to provider to advise. See message from RN below.

## 2019-05-12 NOTE — Telephone Encounter (Signed)
Patient called back to speak with nurse about medications. Please advise.

## 2019-05-12 NOTE — Telephone Encounter (Signed)
Please check with patient about what dose he has been taking.

## 2019-05-18 DIAGNOSIS — I9789 Other postprocedural complications and disorders of the circulatory system, not elsewhere classified: Secondary | ICD-10-CM | POA: Diagnosis not present

## 2019-05-18 DIAGNOSIS — Z87891 Personal history of nicotine dependence: Secondary | ICD-10-CM | POA: Diagnosis not present

## 2019-05-18 DIAGNOSIS — I4891 Unspecified atrial fibrillation: Secondary | ICD-10-CM | POA: Diagnosis not present

## 2019-05-18 DIAGNOSIS — R5383 Other fatigue: Secondary | ICD-10-CM | POA: Diagnosis not present

## 2019-05-18 DIAGNOSIS — Z79899 Other long term (current) drug therapy: Secondary | ICD-10-CM | POA: Diagnosis not present

## 2019-05-18 DIAGNOSIS — R0602 Shortness of breath: Secondary | ICD-10-CM | POA: Diagnosis not present

## 2019-05-20 ENCOUNTER — Other Ambulatory Visit: Payer: Self-pay | Admitting: Family Medicine

## 2019-05-20 DIAGNOSIS — R0602 Shortness of breath: Secondary | ICD-10-CM | POA: Diagnosis not present

## 2019-05-20 DIAGNOSIS — G4733 Obstructive sleep apnea (adult) (pediatric): Secondary | ICD-10-CM | POA: Diagnosis not present

## 2019-05-21 DIAGNOSIS — G4733 Obstructive sleep apnea (adult) (pediatric): Secondary | ICD-10-CM | POA: Diagnosis not present

## 2019-05-21 DIAGNOSIS — R0602 Shortness of breath: Secondary | ICD-10-CM | POA: Diagnosis not present

## 2019-05-26 ENCOUNTER — Telehealth: Payer: Self-pay

## 2019-05-26 ENCOUNTER — Ambulatory Visit: Payer: Self-pay | Admitting: Pharmacist

## 2019-05-26 NOTE — Chronic Care Management (AMB) (Signed)
  Chronic Care Management   Note  05/26/2019 Name: Kristopher [name] MRN: [id number] DOB: 1961/02/23  Kristopher [name] is a 59 y.o. year old male who is a primary care patient of [name], DO. The CCM team was consulted for assistance with chronic disease management and care coordination needs.    Attempted to contact patient for medication management review. Left HIPAA compliant message for patient to return my call at their convenience.   Plan: - Will collaborate with Care Guide to outreach to schedule follow up with me  Catie Darnelle Maffucci, PharmD, [place] [phone]

## 2019-05-27 ENCOUNTER — Telehealth: Payer: Self-pay | Admitting: Family Medicine

## 2019-05-27 NOTE — Chronic Care Management (AMB) (Signed)
  Care Management   Note  05/27/2019 Name: Kristopher [name] MRN: [id number] DOB: 1960-03-11  Kristopher [name] is a 59 y.o. year old male who is a primary care patient of [name], DO and is actively engaged with the care management team. I reached out to [name] by phone today to assist with re-scheduling a follow up visit with the Pharmacist  Follow up plan: Telephone appointment with care management team member scheduled for:07/30/2019  Noreene Larsson, [place], [place], [place] [address] Direct Dial: [phone] [email or web address] Website: [email or web address]

## 2019-05-28 ENCOUNTER — Telehealth: Payer: Self-pay | Admitting: Family Medicine

## 2019-05-28 NOTE — Telephone Encounter (Signed)
   [facility] 05/28/2019 Name: Kristopher [name]   MRN: [id number]   DOB: 1960-08-20   AGE: 59 y.o.   GENDER: male   PCP Park Liter P, DO.   Called pt regarding [employer] Referral for dental assistance. Pt stated that he had started seeing dentist in [place], [place] and he has had 2 appts so far including his x-rays. Notified patient that there is not specific financial resources for dentistry that I am aware of but encouraged him to reach out to the billing department at [facility] to set up a payment plan once he has dental work/surgery completed. Gave my contact information in case he needed anything further or was interested in seeing a lower cost provider in the future.  [email or web address] . [email or web address]  [phone]

## 2019-06-02 DIAGNOSIS — G4733 Obstructive sleep apnea (adult) (pediatric): Secondary | ICD-10-CM | POA: Diagnosis not present

## 2019-06-11 ENCOUNTER — Telehealth: Payer: Self-pay | Admitting: General Practice

## 2019-06-11 ENCOUNTER — Ambulatory Visit (INDEPENDENT_AMBULATORY_CARE_PROVIDER_SITE_OTHER): Payer: Medicare Other | Admitting: General Practice

## 2019-06-11 DIAGNOSIS — E1169 Type 2 diabetes mellitus with other specified complication: Secondary | ICD-10-CM

## 2019-06-11 DIAGNOSIS — N181 Chronic kidney disease, stage 1: Secondary | ICD-10-CM

## 2019-06-11 DIAGNOSIS — I10 Essential (primary) hypertension: Secondary | ICD-10-CM

## 2019-06-11 DIAGNOSIS — E785 Hyperlipidemia, unspecified: Secondary | ICD-10-CM

## 2019-06-11 DIAGNOSIS — F17210 Nicotine dependence, cigarettes, uncomplicated: Secondary | ICD-10-CM

## 2019-06-11 DIAGNOSIS — E1122 Type 2 diabetes mellitus with diabetic chronic kidney disease: Secondary | ICD-10-CM

## 2019-06-11 NOTE — Chronic Care Management (AMB) (Signed)
Chronic Care Management   Follow Up Note   06/11/2019 Name: MCGWIRE DASARO MRN: [id number] DOB: 1960-06-09  Referred by: [name], DO Reason for referral : Chronic Care Management (RNCM: Chronic disease Management and care coordination needs)   VERNICE BOWKER is a 59 y.o. year old male who is a primary care patient of [name], DO. The CCM team was consulted for assistance with chronic disease management and care coordination needs.    Review of patient status, including review of consultants reports, relevant laboratory and other test results, and collaboration with appropriate care team members and the patient's provider was performed as part of comprehensive patient evaluation and provision of chronic care management services.    SDOH (Social Determinants of Health) assessments performed: Yes See Care Plan activities for detailed interventions related to SDOH)  SDOH Interventions     Most Recent Value  SDOH Interventions  SDOH Interventions for the Following Domains  Physical Activity  Physical Activity Interventions  Other (Comments) [the patient is increasing his activity daily, sometimes pushes self and can tell the next day]       Outpatient Encounter Medications as of 06/11/2019  Medication Sig  . metoprolol succinate (TOPROL-XL) 100 MG 24 hr tablet TAKE 1 TABLET BY MOUTH TWICE A DAY. *TAKE WITH THE 25MG *  . Accu-Chek FastClix Lancets MISC USE TO TEST BLOOD SUGAR 2X A DAY  . ACCU-CHEK GUIDE test strip USE TO TEST BLOOD SUGAR 2X A DAY  . amiodarone (PACERONE) 200 MG tablet Take 1 tablet (200 mg total) by mouth 2 (two) times daily. (Patient taking differently: Take 200 mg by mouth daily. Reduced to daily per Dr. Saralyn Pilar)  . amLODipine (NORVASC) 5 MG tablet Take 1 tablet (5 mg total) by mouth 2 (two) times daily.  [name] amoxicillin (AMOXIL) 500 MG capsule Take 500 mg by mouth 3 (three) times daily.  . ARNUITY ELLIPTA 100 MCG/ACT AEPB Inhale 1 puff into the lungs  daily.  [name] atorvastatin (LIPITOR) 10 MG tablet Take 1 tablet (10 mg total) by mouth daily at 6 PM.  . hydrALAZINE (APRESOLINE) 100 MG tablet Take 1 tablet (100 mg total) by mouth 2 (two) times daily.  . metFORMIN (GLUCOPHAGE) 500 MG tablet Take 1 tablet (500 mg total) by mouth 2 (two) times daily with a meal.  . metoprolol tartrate (LOPRESSOR) 50 MG tablet Take 1 tablet (50 mg total) by mouth 2 (two) times daily.  . montelukast (SINGULAIR) 10 MG tablet Take 10 mg by mouth daily.  . NONFORMULARY OR COMPOUNDED ITEM Apply 1-2 mLs topically 4 (four) times daily as needed. 10% Ketamine/2% Cyclobenzaprine/6% Gabapentin Cream  . nortriptyline (PAMELOR) 25 MG capsule Take 2 capsules (50 mg total) by mouth at bedtime.  . Oxycodone HCl 10 MG TABS Take 1 tablet (10 mg total) by mouth every 6 (six) hours as needed. Must last 30 days  . Oxycodone HCl 10 MG TABS Take 1 tablet (10 mg total) by mouth every 6 (six) hours as needed. Must last 30 days  . [START ON 06/26/2019] Oxycodone HCl 10 MG TABS Take 1 tablet (10 mg total) by mouth every 6 (six) hours as needed. Must last 30 days  . pantoprazole (PROTONIX) 40 MG tablet Take 1 tablet (40 mg total) by mouth daily.  . Potassium Chloride ER 20 MEQ TBCR Take 1 tablet by mouth daily.  . SUMAtriptan (IMITREX) 50 MG tablet TAKE 1 TAB AT ONSET OF MIGRAINE. MAY REPEAT IN 2 HOURS IF HEADACHE PERSISTS  OR RECURS.   Facility-Administered Encounter Medications as of 06/11/2019  Medication  . 0.9 %  sodium chloride infusion     Objective:   BP Readings from Last 3 Encounters:  06/10/19 (!) 120/94  04/22/19 (!) 135/99  03/19/19 121/80   Lab Results  Component Value Date   HGBA1C 7.2 (H) 04/27/2019   Goals Addressed            This Visit's Progress   . RNCM- I need to stay healthy for my granddaughter (pt-stated)       Current Barriers:  [name] Knowledge Deficits related to basic understanding of hypertension, diabetes,  and hyperlipidemia  pathophysiology and self  care management . Knowledge Deficits related to understanding of medications prescribed for management of hypertension and hyperlipidemia  . Patient continues to smoke 0.5 packs a day- patient has started back since his surgery and has patches. Knows he needs to quit completely.  Is down to about 3 cigarettes a day . Exercise program limited by need of a new knee brace- the patient is more active- completed . New lung cancer diagnosis- surgery with removal of lung cancer- doing well post surgery. Does not need chemo or radiation  Case Manager Clinical Goal(s):  [name] Over the next 120 days, patient will verbalize understanding of plan for diabetes management, hypertension management, HLD management and management of chronic disease processes   Interventions:  . Discussed plans with patient for ongoing care management follow up and provided patient with direct contact information for care management team   . Evaluation of Heart healthy/ADA diet. The patient is drinking sodas, does not like diet. Education on the effects of foods high in salt and sugar in a patient with HTN and DM.  The patient states he is doing better with his diet. He is eating a lot of oranges. Education on hidden sugars and sodium in foods. The patient is eating "a lot" of salads.  The patient wants to keep his hemoglobin A1C down. The reading in February was 7.2, ^ from 5.8 in August of 2020.  Education on healthy food choices. The patient knows that corn and carrots have negative impact on blood sugars. The last couple of days readings are 135 and 140.  [name] Evaluation of the knee brace the patient is requesting.  The patient was prescribed this knee brace in 2011 and it was in [place]. The patient to ask the pcp on virtual visit on Thursday about a prescription for knee brace. RNCM to do research to see if the knee brace can be found by local vendor . Review of blood pressure readings. The last reading was 120/94 Encouraged to monitor blood  pressure and discuss treatment options with pcp.  The patient says he is having a hard time with a dry mouth especially at night. Education on medications for HTN may be the reason for dry mouth. Will discuss with the pharmacist for review.  . Care guide referral for help with dental questions and needs related to declined oral care due to chronic disease processes's . Education of the benefits of smoking cessation and working with the patient to reach a goal of quitting.  The patient is down to 3 cigarettes a day.  The patient is using patches but not smoking with the patches on, does not like the taste of the nicotine gum.   . Review of exercise goals. The patient is walking his drive way daily and pushing himself to go further each day. The patient  feels better but admits he knows when he has overdone it. He wants to continue to improve his health. His 58 year old granddaughter enjoys going to [facility] and he is learning how to effectively pace his activity.     Patient Self Care Activities:  . UNABLE to independently manage HTN as evidenced by reported headaches and high b/p numbers.  . Checks BP and records as discussed . Unable to follow a hearth healthy/ADA diet  . Unable to maintain stable blood sugar readings as evidence of hemoglobin A1C of 7.2  Please see past updates related to this goal by clicking on the "Past Updates" button in the selected goal           Plan:   The care management team will reach out to the patient again over the next 60 to 90 days.    Noreene Larsson RN, MSN, [facility] Mobile: [phone]

## 2019-06-11 NOTE — Patient Instructions (Signed)
Visit Information  Goals Addressed            This Visit's Progress    RNCM- I need to stay healthy for my granddaughter (pt-stated)       Current Barriers:   Knowledge Deficits related to basic understanding of hypertension, diabetes,  and hyperlipidemia  pathophysiology and self care management  Knowledge Deficits related to understanding of medications prescribed for management of hypertension and hyperlipidemia   Patient continues to smoke 0.5 packs a day- patient has started back since his surgery and has patches. Knows he needs to quit completely.  Is down to about 3 cigarettes a day  Exercise program limited by need of a new knee brace- the patient is more active- completed  New lung cancer diagnosis- surgery with removal of lung cancer- doing well post surgery. Does not need chemo or radiation  Case Manager Clinical Goal(s):   Over the next 120 days, patient will verbalize understanding of plan for diabetes management, hypertension management, HLD management and management of chronic disease processes   Interventions:   Discussed plans with patient for ongoing care management follow up and provided patient with direct contact information for care management team    Evaluation of Heart healthy/ADA diet. The patient is drinking sodas, does not like diet. Education on the effects of foods high in salt and sugar in a patient with HTN and DM.  The patient states he is doing better with his diet. He is eating a lot of oranges. Education on hidden sugars and sodium in foods. The patient is eating "a lot" of salads.  The patient wants to keep his hemoglobin A1C down. The reading in February was 7.2, ^ from 5.8 in August of 2020.  Education on healthy food choices. The patient knows that corn and carrots have negative impact on blood sugars. The last couple of days readings are 135 and 140.   Evaluation of the knee brace the patient is requesting.  The patient was prescribed this knee  brace in 2011 and it was in [place]. The patient to ask the pcp on virtual visit on Thursday about a prescription for knee brace. RNCM to do research to see if the knee brace can be found by local vendor  Review of blood pressure readings. The last reading was 120/94 Encouraged to monitor blood pressure and discuss treatment options with pcp.  The patient says he is having a hard time with a dry mouth especially at night. Education on medications for HTN may be the reason for dry mouth. Will discuss with the pharmacist for review.   Care guide referral for help with dental questions and needs related to declined oral care due to chronic disease processes's  Education of the benefits of smoking cessation and working with the patient to reach a goal of quitting.  The patient is down to 3 cigarettes a day.  The patient is using patches but not smoking with the patches on, does not like the taste of the nicotine gum.    Review of exercise goals. The patient is walking his drive way daily and pushing himself to go further each day. The patient feels better but admits he knows when he has overdone it. He wants to continue to improve his health. His 15 year old granddaughter enjoys going to [facility] and he is learning how to effectively pace his activity.     Patient Self Care Activities:   UNABLE to independently manage HTN as evidenced by reported headaches  and high b/p numbers.   Checks BP and records as discussed  Unable to follow a hearth healthy/ADA diet   Unable to maintain stable blood sugar readings as evidence of hemoglobin A1C of 7.2  Please see past updates related to this goal by clicking on the "Past Updates" button in the selected goal          Patient verbalizes understanding of instructions provided today.   The care management team will reach out to the patient again over the next 60 to 90 days.   Noreene Larsson RN, MSN, [facility] Mobile: [phone]

## 2019-06-16 ENCOUNTER — Telehealth: Payer: Self-pay

## 2019-06-16 ENCOUNTER — Encounter: Payer: Self-pay | Admitting: Family Medicine

## 2019-06-16 ENCOUNTER — Telehealth (INDEPENDENT_AMBULATORY_CARE_PROVIDER_SITE_OTHER): Payer: Medicare Other | Admitting: Family Medicine

## 2019-06-16 VITALS — BP 140/100 | HR 89

## 2019-06-16 DIAGNOSIS — G4733 Obstructive sleep apnea (adult) (pediatric): Secondary | ICD-10-CM | POA: Diagnosis not present

## 2019-06-16 MED ORDER — LIDOCAINE 5 % EX PTCH: 1.0000 | MEDICATED_PATCH | CUTANEOUS | 12 refills | Status: DC

## 2019-06-16 NOTE — Telephone Encounter (Signed)
Prior Authorization initiated via CoverMyMeds for Lidocaine patches. Key: [place]

## 2019-06-16 NOTE — Progress Notes (Signed)
BP (!) 140/100   Pulse 89    Subjective:    Patient ID: Kristopher [name], male    DOB: 10/03/1960, 59 y.o.   MRN: [id number]  HPI: Kristopher [name] is a 59 y.o. male  Chief Complaint  Patient presents with  . Sleep Apnea   SLEEP APNEA Sleep apnea status: uncontrolled Duration: newly diagnosed Satisfied with current treatment?:  Getting used to the CPAP CPAP use:  Yes- for the past 2 days Sleep quality with CPAP use: N/A Last sleep study: March Wakes feeling refreshed:  no Daytime hypersomnolence:  no Fatigue:  yes Insomnia:  no Good sleep hygiene:  yes Difficulty falling asleep:  no Difficulty staying asleep:  no Snoring bothers bed partner:  yes Observed apnea by bed partner: yes Obesity:  yes Hypertension: yes  Pulmonary hypertension:  no   Relevant past medical, surgical, family and social history reviewed and updated as indicated. Interim medical history since our last visit reviewed. Allergies and medications reviewed and updated.  Review of Systems  Constitutional: Positive for fatigue. Negative for activity change, appetite change, chills, diaphoresis, fever and unexpected weight change.  Respiratory: Negative.   Cardiovascular: Negative.   Gastrointestinal: Negative.   Musculoskeletal: Negative.   Psychiatric/Behavioral: Negative.     Per HPI unless specifically indicated above     Objective:    BP (!) 140/100   Pulse 89   Wt Readings from Last 3 Encounters:  03/19/19 224 lb (101.6 kg)  02/17/19 219 lb (99.3 kg)  02/12/19 222 lb 1.6 oz (100.7 kg)    Physical Exam Vitals and nursing note reviewed.  Constitutional:      General: He is not in acute distress.    Appearance: Normal appearance. He is not ill-appearing, toxic-appearing or diaphoretic.  HENT:     Head: Normocephalic and atraumatic.     Right Ear: External ear normal.     Left Ear: External ear normal.     Nose: Nose normal.     Mouth/Throat:     Mouth: Mucous membranes are  moist.     Pharynx: Oropharynx is clear.  Eyes:     General: No scleral icterus.       Right eye: No discharge.        Left eye: No discharge.     Conjunctiva/sclera: Conjunctivae normal.     Pupils: Pupils are equal, round, and reactive to light.  Pulmonary:     Effort: Pulmonary effort is normal. No respiratory distress.     Comments: Speaking in full sentences Musculoskeletal:        General: Normal range of motion.     Cervical back: Normal range of motion.  Skin:    Coloration: Skin is not jaundiced or pale.     Findings: No bruising, erythema, lesion or rash.  Neurological:     Mental Status: He is alert and oriented to person, place, and time. Mental status is at baseline.  Psychiatric:        Mood and Affect: Mood normal.        Behavior: Behavior normal.        Thought Content: Thought content normal.        Judgment: Judgment normal.     Results for orders placed or performed in visit on 04/27/19  Microalbumin, Urine Waived  Result Value Ref Range   Microalb, Ur Waived 150 (H) 0 - 19 mg/L   Creatinine, Urine Waived 100 10 - 300 mg/dL   Microalb/Creat Ratio  30-300 (H) <30 mg/g  Lipid Panel w/o Chol/HDL Ratio  Result Value Ref Range   Cholesterol, Total 155 100 - 199 mg/dL   Triglycerides 267 (H) 0 - 149 mg/dL   HDL 34 (L) >39 mg/dL   VLDL Cholesterol Cal 44 (H) 5 - 40 mg/dL   LDL Chol Calc (NIH) 77 0 - 99 mg/dL  Comprehensive metabolic panel  Result Value Ref Range   Glucose 208 (H) 65 - 99 mg/dL   BUN 11 6 - 24 mg/dL   Creatinine, Ser 0.74 (L) 0.76 - 1.27 mg/dL   GFR calc non Af Amer 102 >59 mL/min/1.73   GFR calc Af Amer 118 >59 mL/min/1.73   BUN/Creatinine Ratio 15 9 - 20   Sodium 141 134 - 144 mmol/L   Potassium 3.7 3.5 - 5.2 mmol/L   Chloride 100 96 - 106 mmol/L   CO2 27 20 - 29 mmol/L   Calcium 9.3 8.7 - 10.2 mg/dL   Total Protein 7.3 6.0 - 8.5 g/dL   Albumin 4.5 3.8 - 4.9 g/dL   Globulin, Total 2.8 1.5 - 4.5 g/dL   Albumin/Globulin Ratio 1.6 1.2  - 2.2   Bilirubin Total 0.3 0.0 - 1.2 mg/dL   Alkaline Phosphatase 129 (H) 39 - 117 IU/L   AST 43 (H) 0 - 40 IU/L   ALT 69 (H) 0 - 44 IU/L  CBC with Differential/Platelet  Result Value Ref Range   WBC 8.5 3.4 - 10.8 x10E3/uL   RBC 4.43 4.14 - 5.80 x10E6/uL   Hemoglobin 13.3 13.0 - 17.7 g/dL   Hematocrit 39.9 37.5 - 51.0 %   MCV 90 79 - 97 fL   MCH 30.0 26.6 - 33.0 pg   MCHC 33.3 31.5 - 35.7 g/dL   RDW 13.5 11.6 - 15.4 %   Platelets 211 150 - 450 x10E3/uL   Neutrophils 51 Not Estab. %   Lymphs 40 Not Estab. %   Monocytes 6 Not Estab. %   Eos 1 Not Estab. %   Basos 1 Not Estab. %   Neutrophils Absolute 4.3 1.4 - 7.0 x10E3/uL   Lymphocytes Absolute 3.4 (H) 0.7 - 3.1 x10E3/uL   Monocytes Absolute 0.5 0.1 - 0.9 x10E3/uL   EOS (ABSOLUTE) 0.1 0.0 - 0.4 x10E3/uL   Basophils Absolute 0.1 0.0 - 0.2 x10E3/uL   Immature Granulocytes 1 Not Estab. %   Immature Grans (Abs) 0.1 0.0 - 0.1 x10E3/uL  Bayer DCA Hb A1c Waived  Result Value Ref Range   HB A1C (BAYER DCA - WAIVED) 7.2 (H) <7.0 %      Assessment & Plan:   Problem List Items Addressed This Visit      Respiratory   OSA (obstructive sleep apnea) - Primary    Recently diagnosed. Just got his CPAP. Will continue to monitor and continue to wear it. Call with any concerns. Continue to monitor.           Follow up plan: Return as scheduled.   . This visit was completed via MyChart due to the restrictions of the COVID-19 pandemic. All issues as above were discussed and addressed. Physical exam was done as above through visual confirmation on MyChart. If it was felt that the patient should be evaluated in the office, they were directed there. The patient verbally consented to this visit. . Location of the patient: home . Location of the provider: home . Those involved with this call:  . Provider: Park Liter, DO . CMA: Lauretta Grill, RMA .  Front Desk/Registration: Don Perking  . Time spent on call: 15 minutes with  patient face to face via video conference. More than 50% of this time was spent in counseling and coordination of care. 23 minutes total spent in review of patient's record and preparation of their chart.

## 2019-06-16 NOTE — Telephone Encounter (Signed)
PA Approved

## 2019-06-16 NOTE — Assessment & Plan Note (Addendum)
Recently diagnosed. Just got his CPAP. Will continue to monitor and continue to wear it. Call with any concerns. Continue to monitor.

## 2019-06-19 ENCOUNTER — Other Ambulatory Visit: Payer: Self-pay | Admitting: Family Medicine

## 2019-06-30 DIAGNOSIS — I4891 Unspecified atrial fibrillation: Secondary | ICD-10-CM | POA: Diagnosis not present

## 2019-06-30 DIAGNOSIS — Z720 Tobacco use: Secondary | ICD-10-CM | POA: Diagnosis not present

## 2019-06-30 DIAGNOSIS — R0602 Shortness of breath: Secondary | ICD-10-CM | POA: Diagnosis not present

## 2019-06-30 DIAGNOSIS — I9789 Other postprocedural complications and disorders of the circulatory system, not elsewhere classified: Secondary | ICD-10-CM | POA: Diagnosis not present

## 2019-06-30 DIAGNOSIS — I10 Essential (primary) hypertension: Secondary | ICD-10-CM | POA: Diagnosis not present

## 2019-07-02 ENCOUNTER — Ambulatory Visit: Payer: Self-pay | Admitting: General Practice

## 2019-07-02 ENCOUNTER — Telehealth (INDEPENDENT_AMBULATORY_CARE_PROVIDER_SITE_OTHER): Payer: Medicare Other | Admitting: Family Medicine

## 2019-07-02 ENCOUNTER — Telehealth: Payer: Self-pay | Admitting: Family Medicine

## 2019-07-02 ENCOUNTER — Encounter: Payer: Self-pay | Admitting: Family Medicine

## 2019-07-02 VITALS — BP 200/105 | HR 85

## 2019-07-02 DIAGNOSIS — E1169 Type 2 diabetes mellitus with other specified complication: Secondary | ICD-10-CM

## 2019-07-02 DIAGNOSIS — I10 Essential (primary) hypertension: Secondary | ICD-10-CM | POA: Diagnosis not present

## 2019-07-02 DIAGNOSIS — E1122 Type 2 diabetes mellitus with diabetic chronic kidney disease: Secondary | ICD-10-CM | POA: Diagnosis not present

## 2019-07-02 DIAGNOSIS — K0889 Other specified disorders of teeth and supporting structures: Secondary | ICD-10-CM

## 2019-07-02 DIAGNOSIS — N181 Chronic kidney disease, stage 1: Secondary | ICD-10-CM

## 2019-07-02 DIAGNOSIS — E785 Hyperlipidemia, unspecified: Secondary | ICD-10-CM | POA: Diagnosis not present

## 2019-07-02 DIAGNOSIS — G4733 Obstructive sleep apnea (adult) (pediatric): Secondary | ICD-10-CM | POA: Diagnosis not present

## 2019-07-02 DIAGNOSIS — F17210 Nicotine dependence, cigarettes, uncomplicated: Secondary | ICD-10-CM

## 2019-07-02 MED ORDER — CLINDAMYCIN HCL 150 MG PO CAPS: 150.0000 mg | ORAL_CAPSULE | Freq: Three times a day (TID) | ORAL | 0 refills | Status: DC

## 2019-07-02 NOTE — Chronic Care Management (AMB) (Signed)
Chronic Care Management   Follow Up Note   07/02/2019 Name: Kristopher [name] MRN: [id number] DOB: 1960-03-21  Referred by: [name], DO Reason for referral : Chronic Care Management (VM from the patient asking the RNCM to call  him back he thought he had an infection)   Kristopher [name] is a 59 y.o. year old male who is a primary care patient of [name], DO. The CCM team was consulted for assistance with chronic disease management and care coordination needs.    Review of patient status, including review of consultants reports, relevant laboratory and other test results, and collaboration with appropriate care team members and the patient's provider was performed as part of comprehensive patient evaluation and provision of chronic care management services.    SDOH (Social Determinants of Health) assessments performed: No See Care Plan activities for detailed interventions related to [facility])     Outpatient Encounter Medications as of 07/02/2019  Medication Sig   Accu-Chek FastClix Lancets MISC USE TO TEST BLOOD SUGAR 2X A DAY   ACCU-CHEK GUIDE test strip USE TO TEST BLOOD SUGAR 2X A DAY   amiodarone (PACERONE) 200 MG tablet Take 1 tablet (200 mg total) by mouth 2 (two) times daily. (Patient taking differently: Take 200 mg by mouth daily. Reduced to daily per Dr. Saralyn Pilar)   amLODipine (NORVASC) 5 MG tablet Take 1 tablet (5 mg total) by mouth 2 (two) times daily.   ARNUITY ELLIPTA 100 MCG/ACT AEPB Inhale 1 puff into the lungs daily.   atorvastatin (LIPITOR) 10 MG tablet Take 1 tablet (10 mg total) by mouth daily at 6 PM.   clindamycin (CLEOCIN) 150 MG capsule Take 1 capsule (150 mg total) by mouth 3 (three) times daily.   hydrALAZINE (APRESOLINE) 100 MG tablet Take 1 tablet (100 mg total) by mouth 2 (two) times daily.   lidocaine (LIDODERM) 5 % Place 1 patch onto the skin daily. Remove & Discard patch within 12 hours or as directed by MD   metFORMIN (GLUCOPHAGE)  500 MG tablet Take 1 tablet (500 mg total) by mouth 2 (two) times daily with a meal.   metoprolol succinate (TOPROL-XL) 100 MG 24 hr tablet TAKE 1 TABLET BY MOUTH TWICE A DAY. *TAKE WITH THE 25MG *   metoprolol tartrate (LOPRESSOR) 50 MG tablet Take 1 tablet (50 mg total) by mouth 2 (two) times daily.   montelukast (SINGULAIR) 10 MG tablet Take 10 mg by mouth daily.   NONFORMULARY OR COMPOUNDED ITEM Apply 1-2 mLs topically 4 (four) times daily as needed. 10% Ketamine/2% Cyclobenzaprine/6% Gabapentin Cream   nortriptyline (PAMELOR) 25 MG capsule Take 2 capsules (50 mg total) by mouth at bedtime.   Oxycodone HCl 10 MG TABS Take 1 tablet (10 mg total) by mouth every 6 (six) hours as needed. Must last 30 days   Oxycodone HCl 10 MG TABS Take 1 tablet (10 mg total) by mouth every 6 (six) hours as needed. Must last 30 days   Oxycodone HCl 10 MG TABS Take 1 tablet (10 mg total) by mouth every 6 (six) hours as needed. Must last 30 days   pantoprazole (PROTONIX) 40 MG tablet Take 1 tablet (40 mg total) by mouth daily.   Potassium Chloride ER 20 MEQ TBCR Take 1 tablet by mouth daily.   SUMAtriptan (IMITREX) 50 MG tablet TAKE 1 TAB AT ONSET OF MIGRAINE. MAY REPEAT IN 2 HOURS IF HEADACHE PERSISTS OR RECURS.   Facility-Administered Encounter Medications as of 07/02/2019  Medication  0.9 %  sodium chloride infusion     Objective:  BP Readings from Last 3 Encounters:  07/02/19 (!) 200/105  06/16/19 (!) 140/100  06/10/19 (!) 120/94    Goals Addressed            This Visit's Progress    RNCM- I need to stay healthy for my granddaughter (pt-stated)       Current Barriers:   Knowledge Deficits related to basic understanding of hypertension, diabetes,  and hyperlipidemia  pathophysiology and self care management  Knowledge Deficits related to understanding of medications prescribed for management of hypertension and hyperlipidemia   Patient continues to smoke 0.5 packs a day- patient has  started back since his surgery and has patches. Knows he needs to quit completely.  Is down to about 3 cigarettes a day  Exercise program limited by need of a new knee brace- the patient is more active- completed  New lung cancer diagnosis- surgery with removal of lung cancer- doing well post surgery. Does not need chemo or radiation  Case Manager Clinical Goal(s):   Over the next 120 days, patient will verbalize understanding of plan for diabetes management, hypertension management, HLD management and management of chronic disease processes   Interventions:   Discussed plans with patient for ongoing care management follow up and provided patient with direct contact information for care management team    Evaluation of Heart healthy/ADA diet. The patient is drinking sodas, does not like diet. Education on the effects of foods high in salt and sugar in a patient with HTN and DM.  The patient states he is doing better with his diet. He is eating a lot of oranges. Education on hidden sugars and sodium in foods. The patient is eating "a lot" of salads.  The patient wants to keep his hemoglobin A1C down. The reading in February was 7.2, ^ from 5.8 in August of 2020.  Education on healthy food choices. The patient knows that corn and carrots have negative impact on blood sugars. The last couple of days readings are 135 and 140.   Education on the body taking longer to heal when having procedures and teeth pulled due to his DM, HTN and smoking does not help with quick healing processes and increases risk of infection.   Evaluation of the knee brace the patient is requesting.  The patient was prescribed this knee brace in 2011 and it was in [place]. The patient to ask the pcp on virtual visit on Thursday about a prescription for knee brace. RNCM to do research to see if the knee brace can be found by local vendor  Review of blood pressure readings. The last reading was 120/94 Encouraged to monitor blood  pressure and discuss treatment options with pcp.  The patient says he is having a hard time with a dry mouth especially at night. Education on medications for HTN may be the reason for dry mouth. Will discuss with the pharmacist for review.   Care guide referral for help with dental questions and needs related to declined oral care due to chronic disease processes's  Education of the benefits of smoking cessation and working with the patient to reach a goal of quitting.  The patient is down to 3 cigarettes a day.  The patient is using patches but not smoking with the patches on, does not like the taste of the nicotine gum.    Review of exercise goals. The patient is walking his drive way daily and pushing  himself to go further each day. The patient feels better but admits he knows when he has overdone it. He wants to continue to improve his health. His 69 year old granddaughter enjoys going to [facility] and he is learning how to effectively pace his activity.     Patient Self Care Activities:   UNABLE to independently manage HTN as evidenced by reported headaches and high b/p numbers.   Checks BP and records as discussed  Unable to follow a hearth healthy/ADA diet   Unable to maintain stable blood sugar readings as evidence of hemoglobin A1C of 7.2  Please see past updates related to this goal by clicking on the "Past Updates" button in the selected goal        RNCM: Pt-"I had 7 teeth pulled and I have an infection" (pt-stated)       CARE PLAN ENTRY (see longitudinal plan of care for additional care plan information)  Current Barriers:   Knowledge Deficits related to patient with teeth pulled this week and now he thinks he has an infection.   Unable to get in touch with dentist  Urgent care told him to call pcp office  Nurse Case Manager Clinical Goal(s):   Over the next 30 days, patient will verbalize understanding of plan for contact the dentist to be reevaluated and have contact  information in the event he has further issues with his mouth/teeth  Over the next 30 days, patient will work with pcp and dentist  to address needs related to possible infection post removal of teeth  Interventions:   Inter-disciplinary care team collaboration (see longitudinal plan of care)  Evaluation of current treatment plan related to removal of teeth and patient's adherence to plan as established by provider.  Advised patient to contact the dentist on Monday to be reevaluated  Provided education to patient re: keeping video appointment with pcp for this afternoon and talking to the pcp about recommendations   Patient Self Care Activities:   Patient verbalizes understanding of plan to talk with pcp about possible infection and how to handle acute onset   Self administers medications as prescribed  Calls provider office for new concerns or questions  Unable to independently manage possible infection related to teeth being pulled this week  Does not maintain contact with provider office  Initial goal documentation         Plan:   The care management team will reach out to the patient again over the next 30 to 60 days.    Noreene Larsson RN, MSN, [facility] Mobile: [phone]

## 2019-07-02 NOTE — Telephone Encounter (Signed)
Called melinda scheduled pt for virtual at 4:15 today

## 2019-07-02 NOTE — Telephone Encounter (Signed)
Needs appt. I can do a virtual at 4:15. If they can't do that, he will need to go to an urgent care.

## 2019-07-02 NOTE — Telephone Encounter (Signed)
Called pt's wife Rip Harbour, advised pt would need an appt and no openings, she said they tried urgent care and they won't see him. She states that she can't let him suffer all weekend since the dentist is closed. Please advise.

## 2019-07-02 NOTE — Patient Instructions (Signed)
Visit Information  Goals Addressed            This Visit's Progress   . RNCM- I need to stay healthy for my granddaughter (pt-stated)       Current Barriers:  [name] Knowledge Deficits related to basic understanding of hypertension, diabetes,  and hyperlipidemia  pathophysiology and self care management . Knowledge Deficits related to understanding of medications prescribed for management of hypertension and hyperlipidemia  . Patient continues to smoke 0.5 packs a day- patient has started back since his surgery and has patches. Knows he needs to quit completely.  Is down to about 3 cigarettes a day . Exercise program limited by need of a new knee brace- the patient is more active- completed . New lung cancer diagnosis- surgery with removal of lung cancer- doing well post surgery. Does not need chemo or radiation  Case Manager Clinical Goal(s):  [name] Over the next 120 days, patient will verbalize understanding of plan for diabetes management, hypertension management, HLD management and management of chronic disease processes   Interventions:  . Discussed plans with patient for ongoing care management follow up and provided patient with direct contact information for care management team   . Evaluation of Heart healthy/ADA diet. The patient is drinking sodas, does not like diet. Education on the effects of foods high in salt and sugar in a patient with HTN and DM.  The patient states he is doing better with his diet. He is eating a lot of oranges. Education on hidden sugars and sodium in foods. The patient is eating "a lot" of salads.  The patient wants to keep his hemoglobin A1C down. The reading in February was 7.2, ^ from 5.8 in August of 2020.  Education on healthy food choices. The patient knows that corn and carrots have negative impact on blood sugars. The last couple of days readings are 135 and 140.  [name] Education on the body taking longer to heal when having procedures and teeth pulled due to his  DM, HTN and smoking does not help with quick healing processes and increases risk of infection.  . Evaluation of the knee brace the patient is requesting.  The patient was prescribed this knee brace in 2011 and it was in [place]. The patient to ask the pcp on virtual visit on Thursday about a prescription for knee brace. RNCM to do research to see if the knee brace can be found by local vendor . Review of blood pressure readings. The last reading was 120/94 Encouraged to monitor blood pressure and discuss treatment options with pcp.  The patient says he is having a hard time with a dry mouth especially at night. Education on medications for HTN may be the reason for dry mouth. Will discuss with the pharmacist for review.  . Care guide referral for help with dental questions and needs related to declined oral care due to chronic disease processes's . Education of the benefits of smoking cessation and working with the patient to reach a goal of quitting.  The patient is down to 3 cigarettes a day.  The patient is using patches but not smoking with the patches on, does not like the taste of the nicotine gum.   . Review of exercise goals. The patient is walking his drive way daily and pushing himself to go further each day. The patient feels better but admits he knows when he has overdone it. He wants to continue to improve his health. His 37 year old  granddaughter enjoys going to [facility] and he is learning how to effectively pace his activity.     Patient Self Care Activities:  . UNABLE to independently manage HTN as evidenced by reported headaches and high b/p numbers.  . Checks BP and records as discussed . Unable to follow a hearth healthy/ADA diet  . Unable to maintain stable blood sugar readings as evidence of hemoglobin A1C of 7.2  Please see past updates related to this goal by clicking on the "Past Updates" button in the selected goal       . RNCM: Pt-"I had 7 teeth pulled and I have an infection"  (pt-stated)       CARE PLAN ENTRY (see longitudinal plan of care for additional care plan information)  Current Barriers:  [name] Knowledge Deficits related to patient with teeth pulled this week and now he thinks he has an infection.  . Unable to get in touch with dentist . Urgent care told him to call pcp office  Nurse Case Manager Clinical Goal(s):  [name] Over the next 30 days, patient will verbalize understanding of plan for contact the dentist to be reevaluated and have contact information in the event he has further issues with his mouth/teeth . Over the next 30 days, patient will work with pcp and dentist  to address needs related to possible infection post removal of teeth  Interventions:  . Inter-disciplinary care team collaboration (see longitudinal plan of care) . Evaluation of current treatment plan related to removal of teeth and patient's adherence to plan as established by provider. . Advised patient to contact the dentist on Monday to be reevaluated . Provided education to patient re: keeping video appointment with pcp for this afternoon and talking to the pcp about recommendations   Patient Self Care Activities:  . Patient verbalizes understanding of plan to talk with pcp about possible infection and how to handle acute onset  . Self administers medications as prescribed . Calls provider office for new concerns or questions . Unable to independently manage possible infection related to teeth being pulled this week . Does not maintain contact with provider office  Initial goal documentation        Patient verbalizes understanding of instructions provided today.   The care management team will reach out to the patient again over the next 30 to 60 days.   Noreene Larsson RN, MSN, [facility] Mobile: [phone]

## 2019-07-02 NOTE — Telephone Encounter (Signed)
[phone]

## 2019-07-02 NOTE — Telephone Encounter (Signed)
Rip Harbour, the patient's wife is calling to request an antibotic because the patient had 7 teeth pulled on Wednesday. Patient reached out to the Dentist and the dentist is closed. Preferred Pharmacy [facility] [place]

## 2019-07-02 NOTE — Progress Notes (Signed)
BP (!) 200/105   Pulse 85   SpO2 98%    Subjective:    Patient ID: Kristopher [name], male    DOB: 12/03/1960, 59 y.o.   MRN: [id number]  HPI: Kristopher [name] is a 59 y.o. male  Chief Complaint  Patient presents with  . Dental Pain   DENTAL PAIN- called his dentist but they are closed on Fridays.  Duration: 3 days Dentist evaluation: no Mechanism of injury:  Pulled 7 teeth on Wednesday Onset: sudden Severity: severe Quality: aching and sore Frequency: constant Radiation: no Aggravating factors: everything Alleviating factors: his pain medicine Status: worse Treatments attempted: pain medicine, ice, APAP and NSAIDs Relief with NSAIDs?: moderate Fevers: no Swelling: yes Redness: yes Paresthesias / decreased sensation: no Sinus pressure: no  Relevant past medical, surgical, family and social history reviewed and updated as indicated. Interim medical history since our last visit reviewed. Allergies and medications reviewed and updated.  Review of Systems  Constitutional: Negative.   HENT: Positive for dental problem. Negative for congestion, drooling, ear discharge, ear pain, facial swelling, hearing loss, mouth sores, nosebleeds, postnasal drip, rhinorrhea, sinus pressure, sinus pain, sneezing, sore throat, tinnitus, trouble swallowing and voice change.   Respiratory: Negative.   Cardiovascular: Negative.   Psychiatric/Behavioral: Negative.     Per HPI unless specifically indicated above     Objective:    BP (!) 200/105   Pulse 85   SpO2 98%   Wt Readings from Last 3 Encounters:  03/19/19 224 lb (101.6 kg)  02/17/19 219 lb (99.3 kg)  02/12/19 222 lb 1.6 oz (100.7 kg)    Physical Exam Vitals and nursing note reviewed.  Constitutional:      General: He is not in acute distress.    Appearance: Normal appearance. He is not ill-appearing, toxic-appearing or diaphoretic.  HENT:     Head: Normocephalic and atraumatic.     Right Ear: External ear normal.   Left Ear: External ear normal.     Nose: Nose normal.     Mouth/Throat:     Mouth: Mucous membranes are moist.     Pharynx: Oropharynx is clear.  Eyes:     General: No scleral icterus.       Right eye: No discharge.        Left eye: No discharge.     Conjunctiva/sclera: Conjunctivae normal.     Pupils: Pupils are equal, round, and reactive to light.  Pulmonary:     Effort: Pulmonary effort is normal. No respiratory distress.     Comments: Speaking in full sentences Musculoskeletal:        General: Normal range of motion.     Cervical back: Normal range of motion.  Skin:    Coloration: Skin is not jaundiced or pale.     Findings: No bruising, erythema, lesion or rash.  Neurological:     Mental Status: He is alert and oriented to person, place, and time. Mental status is at baseline.  Psychiatric:        Mood and Affect: Mood normal.        Behavior: Behavior normal.        Thought Content: Thought content normal.        Judgment: Judgment normal.     Results for orders placed or performed in visit on 04/27/19  Microalbumin, Urine Waived  Result Value Ref Range   Microalb, Ur Waived 150 (H) 0 - 19 mg/L   Creatinine, Urine Waived 100 10 - 300  mg/dL   Microalb/Creat Ratio 30-300 (H) <30 mg/g  Lipid Panel w/o Chol/HDL Ratio  Result Value Ref Range   Cholesterol, Total 155 100 - 199 mg/dL   Triglycerides 267 (H) 0 - 149 mg/dL   HDL 34 (L) >39 mg/dL   VLDL Cholesterol Cal 44 (H) 5 - 40 mg/dL   LDL Chol Calc (NIH) 77 0 - 99 mg/dL  Comprehensive metabolic panel  Result Value Ref Range   Glucose 208 (H) 65 - 99 mg/dL   BUN 11 6 - 24 mg/dL   Creatinine, Ser 0.74 (L) 0.76 - 1.27 mg/dL   GFR calc non Af Amer 102 >59 mL/min/1.73   GFR calc Af Amer 118 >59 mL/min/1.73   BUN/Creatinine Ratio 15 9 - 20   Sodium 141 134 - 144 mmol/L   Potassium 3.7 3.5 - 5.2 mmol/L   Chloride 100 96 - 106 mmol/L   CO2 27 20 - 29 mmol/L   Calcium 9.3 8.7 - 10.2 mg/dL   Total Protein 7.3 6.0 - 8.5  g/dL   Albumin 4.5 3.8 - 4.9 g/dL   Globulin, Total 2.8 1.5 - 4.5 g/dL   Albumin/Globulin Ratio 1.6 1.2 - 2.2   Bilirubin Total 0.3 0.0 - 1.2 mg/dL   Alkaline Phosphatase 129 (H) 39 - 117 IU/L   AST 43 (H) 0 - 40 IU/L   ALT 69 (H) 0 - 44 IU/L  CBC with Differential/Platelet  Result Value Ref Range   WBC 8.5 3.4 - 10.8 x10E3/uL   RBC 4.43 4.14 - 5.80 x10E6/uL   Hemoglobin 13.3 13.0 - 17.7 g/dL   Hematocrit 39.9 37.5 - 51.0 %   MCV 90 79 - 97 fL   MCH 30.0 26.6 - 33.0 pg   MCHC 33.3 31.5 - 35.7 g/dL   RDW 13.5 11.6 - 15.4 %   Platelets 211 150 - 450 x10E3/uL   Neutrophils 51 Not Estab. %   Lymphs 40 Not Estab. %   Monocytes 6 Not Estab. %   Eos 1 Not Estab. %   Basos 1 Not Estab. %   Neutrophils Absolute 4.3 1.4 - 7.0 x10E3/uL   Lymphocytes Absolute 3.4 (H) 0.7 - 3.1 x10E3/uL   Monocytes Absolute 0.5 0.1 - 0.9 x10E3/uL   EOS (ABSOLUTE) 0.1 0.0 - 0.4 x10E3/uL   Basophils Absolute 0.1 0.0 - 0.2 x10E3/uL   Immature Granulocytes 1 Not Estab. %   Immature Grans (Abs) 0.1 0.0 - 0.1 x10E3/uL  Bayer DCA Hb A1c Waived  Result Value Ref Range   HB A1C (BAYER DCA - WAIVED) 7.2 (H) <7.0 %      Assessment & Plan:   Problem List Items Addressed This Visit    None    Visit Diagnoses    Pain, dental    -  Primary   Will call dentist on Monday. Will treat with clindamycin. He has pain medicine at home. Continue to monitor. Call with any concerns.        Follow up plan: Return if symptoms worsen or fail to improve.   . This visit was completed via MyChart due to the restrictions of the COVID-19 pandemic. All issues as above were discussed and addressed. Physical exam was done as above through visual confirmation on MyChart. If it was felt that the patient should be evaluated in the office, they were directed there. The patient verbally consented to this visit. . Location of the patient: home . Location of the provider: work . Those involved with this  call:  . Provider: Park Liter, DO . CMA: Lauretta Grill, RMA . Front Desk/Registration: Don Perking  . Time spent on call: 15 minutes with patient face to face via video conference. More than 50% of this time was spent in counseling and coordination of care. 23 minutes total spent in review of patient's record and preparation of their chart.

## 2019-07-15 ENCOUNTER — Other Ambulatory Visit: Payer: Self-pay | Admitting: Family Medicine

## 2019-07-15 NOTE — Telephone Encounter (Signed)
Requested medication (s) are due for refill today: yes  Requested medication (s) are on the active medication list: yes  Last refill:  04/17/2019  Future visit scheduled:  yes  Notes to clinic:  medication was filled by a historical provider  Review for use and refill   Requested Prescriptions  Pending Prescriptions Disp Refills   Potassium Chloride ER 20 MEQ TBCR [Pharmacy Med Name: POTASSIUM CL ER 20 MEQ TABLET] 90 tablet 1    Sig: TAKE 1 [place]      Endocrinology:  Minerals - Potassium Supplementation Failed - 07/15/2019  1:25 AM      Failed - Cr in normal range and within 360 days    Creatinine, Ser  Date Value Ref Range Status  04/27/2019 0.74 (L) 0.76 - 1.27 mg/dL Final          Passed - K in normal range and within 360 days    Potassium  Date Value Ref Range Status  04/27/2019 3.7 3.5 - 5.2 mmol/L Final          Passed - Valid encounter within last 12 months    Recent Outpatient Visits           1 week ago Pain, dental   [facility] [place], [name], DO   4 weeks ago OSA (obstructive sleep apnea)   [facility] [place], [name], DO   2 months ago Type 2 diabetes mellitus with stage 1 chronic kidney disease, without long-term current use of insulin ([place])   [place], [place], DO   5 months ago Cough   [place], Byron, DO   5 months ago Benign hypertensive renal disease   [place], Barb Merino, DO       Future Appointments             Tomorrow Wynetta Emery, Barb Merino, DO [place], [place]   In [place] week [name], MD [place]   In 1 month [place], Kathlene November, MD [facility]

## 2019-07-16 ENCOUNTER — Ambulatory Visit: Payer: Medicare Other | Admitting: Family Medicine

## 2019-07-18 ENCOUNTER — Other Ambulatory Visit: Payer: Self-pay | Admitting: Family Medicine

## 2019-07-18 NOTE — Telephone Encounter (Signed)
Requested Prescriptions  Pending Prescriptions Disp Refills  . metoprolol tartrate (LOPRESSOR) 50 MG tablet [Pharmacy Med Name: METOPROLOL TARTRATE 50 MG TAB] 180 tablet 1    Sig: TAKE 1 TABLET BY MOUTH TWICE A DAY     Cardiovascular:  Beta Blockers Failed - 07/18/2019 12:41 AM      Failed - Last BP in normal range    BP Readings from Last 1 Encounters:  07/02/19 (!) 200/105         Passed - Last Heart Rate in normal range    Pulse Readings from Last 1 Encounters:  07/02/19 85         Passed - Valid encounter within last 6 months    Recent Outpatient Visits          2 weeks ago Pain, dental   [facility] [place], [name], DO   1 month ago OSA (obstructive sleep apnea)   [facility], [name], DO   2 months ago Type 2 diabetes mellitus with stage 1 chronic kidney disease, without long-term current use of insulin ([place])   [place], [place], DO   5 months ago Cough   [place], [place], DO   5 months ago Benign hypertensive renal disease   [place], Barb Merino, DO      Future Appointments            In 1 week [name], MD [place]   In 4 weeks [place], MD [facility]

## 2019-07-19 ENCOUNTER — Other Ambulatory Visit: Payer: Self-pay | Admitting: Family Medicine

## 2019-07-23 ENCOUNTER — Encounter: Payer: Self-pay | Admitting: Pain Medicine

## 2019-07-25 NOTE — Progress Notes (Addendum)
Patient: Kristopher [name]  Service Category: E/M  Provider: Gaspar Cola, MD  DOB: [birth year]  DOS: 07/26/2019  Location: Office  MRN: [id number]  Setting: Ambulatory outpatient  Referring Provider: [name], DO  Type: Established Patient  Specialty: Interventional Pain Management  PCP: [name], DO  Location: Remote location  Delivery: TeleHealth     Virtual Encounter - Pain Management PROVIDER NOTE: Information contained herein reflects review and annotations entered in association with encounter. Interpretation of such information and data should be left to medically-trained personnel. Information provided to patient can be located elsewhere in the medical record under "Patient Instructions". Document created using STT-dictation technology, any transcriptional errors that may result from process are unintentional.    Contact & Pharmacy Preferred: [phone] Home: [phone] (home) Mobile: [phone] (mobile) E-mail: No e-mail address on record  [id number] [address]: [phone]: [phone] [place]: [phone]: [phone]  Pre-screening  Mr. WBushartoffered "in-person" vs "virtual" encounter. He indicated preferring virtual for this encounter.   Reason COVID-19*  Social distancing based on CDC and AMA recommendations.   I contacted MJason Coopon 07/26/2019 via telephone.      I clearly identified myself as FGaspar Cola MD. I verified that I was speaking with the correct person using two identifiers (Name: MAMEEN MOSTAFA and date of birth: [birth year].  Consent I sought verbal advanced consent from MJason Coopfor virtual visit interactions. I informed Mr. WSurgeonof possible security and privacy concerns, risks, and limitations associated with  providing "not-in-person" medical evaluation and management services. I also informed Mr. WBholaof the availability of "in-person" appointments. Finally, I informed him that there would be a charge for the virtual visit and that he could be  personally, fully or partially, financially responsible for it. Mr. WFunariexpressed understanding and agreed to proceed.   Historic Elements   Mr. MKALAB CAMPSis a 59y.o. year old, male patient evaluated today after his last contact with our practice on 02/02/2019. Mr. WBuer has a past medical history of Allergy, Arthritis, Benign hypertensive kidney disease, Chronic back pain, Diabetes mellitus, type 2 ([place], Dyspnea, GERD (gastroesophageal reflux disease), Hypertension, Malignant neoplasm of lung ([place], and Migraines. He also  has a past surgical history that includes Back surgery; Appendectomy; Leg Surgery; Knee surgery (Left); Foot surgery (Left); Colonoscopy with propofol (N/A, 02/19/2016); polypectomy (N/A, 02/19/2016); Total hip arthroplasty (Left, 12/02/2017); Colonoscopy with propofol (N/A, 01/19/2018); polypectomy (01/19/2018); DG OPERATIVE LEFT HIP ([place]); Joint replacement (Left, 12/2017); Video bronchoscopy (Left, 01/14/2019); Thoracotomy (Left, 01/14/2019); Flexible bronchoscopy (Bilateral, 01/20/2019); Flexible bronchoscopy (Bilateral, 01/22/2019); Electormagnetic navigation bronchoscopy (Left, 11/18/2018); and Lung cancer surgery. Mr. WGrenzhas a current medication list which includes the following prescription(s): accu-chek fastclix lancets, accu-chek guide, amiodarone, amlodipine, arnuity ellipta, atorvastatin, hydralazine, lidocaine, metformin, metoprolol succinate, metoprolol tartrate, montelukast, NONFORMULARY OR COMPOUNDED ITEM, nortriptyline, oxycodone hcl, [START ON 08/25/2019] oxycodone hcl, [START ON 09/24/2019] oxycodone hcl, pantoprazole, potassium chloride er, and sumatriptan, and the following Facility-Administered Medications: sodium  chloride. He  reports that he has been smoking cigarettes. He has a 1.75 pack-year smoking history. He has never used smokeless tobacco. He reports current drug use. Drug: Oxycodone. He reports that he does not drink alcohol. Mr. WPierreis allergic to acetaminophen; aspirin; epinephrine; novocain [procaine];  penicillins; strawberry extract; and shellfish allergy.   HPI  Today, he is being contacted for medication management. The patient indicates doing well with the current medication regimen. No adverse reactions or side effects reported to the medications.   Pharmacotherapy Assessment  Analgesic: Oxycodone IR 10 mg, 1 tab PO q 6 hrs (40 mg/day of oxycodone) + Compounded Specialty Analgesic Cream (w/ Ketamine) MME/day:60 mg/day.   Monitoring: [place] PMP: PDMP reviewed during this encounter.       Pharmacotherapy: No side-effects or adverse reactions reported. Compliance: No problems identified. Effectiveness: Clinically acceptable. Plan: Refer to "POC".  UDS:  Summary  Date Value Ref Range Status  01/20/2018 FINAL  Final    Comment:    ==================================================================== TOXASSURE SELECT 13 (MW) ==================================================================== Test                             Result       Flag       Units Drug Present   Oxycodone                      1855                    ng/mg creat   Oxymorphone                    685                     ng/mg creat   Noroxycodone                   1293                    ng/mg creat   Noroxymorphone                 103                     ng/mg creat    Sources of oxycodone are scheduled prescription medications.    Oxymorphone, noroxycodone, and noroxymorphone are expected    metabolites of oxycodone. Oxymorphone is also available as a    scheduled prescription medication. ==================================================================== Test                      Result    Flag   Units       Ref Range   Creatinine              96               mg/dL      >=20 ==================================================================== Declared Medications:  Medication list was not provided. ==================================================================== For clinical consultation, please call [phone]. ====================================================================    Laboratory Chemistry Profile   Renal Lab Results  Component Value Date   BUN 11 04/27/2019   CREATININE 0.74 (L) 04/27/2019   BCR 15 04/27/2019   GFRAA 118 04/27/2019   GFRNONAA 102 04/27/2019     Hepatic Lab Results  Component Value Date   AST 43 (H) 04/27/2019   ALT 69 (H) 04/27/2019   ALBUMIN 4.5 04/27/2019   ALKPHOS 129 (H) 04/27/2019   LIPASE 50 02/17/2019     Electrolytes Lab Results  Component Value Date   NA 141 04/27/2019   K 3.7 04/27/2019   CL 100 04/27/2019   CALCIUM 9.3 04/27/2019   MG 2.4 01/25/2019  PHOS 3.5 01/23/2019     Bone No results found for: VD25OH, H139778, G2877219, JS2831DV7, 25OHVITD1, 25OHVITD2, 25OHVITD3, TESTOFREE, TESTOSTERONE   Inflammation (CRP: Acute Phase) (ESR: Chronic Phase) Lab Results  Component Value Date   CRP 0.7 04/03/2015   ESRSEDRATE 11 10/21/2017   LATICACIDVEN 1.7 01/16/2019       Note: Above Lab results reviewed.  Imaging  DG Chest 2 View CLINICAL DATA:  Follow-up lung carcinoma. Previous left thoracotomy.  EXAM: CHEST - 2 VIEW  COMPARISON:  02/04/2019  FINDINGS: The heart size and mediastinal contours are within normal limits. Left chest wall skin staples have been removed. Mild residual atelectasis or scarring is seen in the left lung base. Lungs are otherwise clear. No evidence of pneumothorax or pleural effusion.  IMPRESSION: Mild residual left basilar atelectasis or scarring.  Electronically Signed   By: Marlaine Hind M.D.   On: 03/19/2019 08:16  Assessment  The primary encounter diagnosis was  Chronic pain syndrome. Diagnoses of Chronic low back pain (Primary Area of Pain) (Bilateral) (L>R), Chronic lower extremity pain (Secondary area of Pain) (Left), Chronic pain of both upper extremities, and Pharmacologic therapy were also pertinent to this visit.  Plan of Care  Problem-specific:  No problem-specific Assessment & Plan notes found for this encounter.  Mr. VERLAN GROTZ has a current medication list which includes the following long-term medication(s): amiodarone, amlodipine, atorvastatin, hydralazine, metformin, metoprolol succinate, metoprolol tartrate, nortriptyline, oxycodone hcl, [START ON 08/25/2019] oxycodone hcl, [START ON 09/24/2019] oxycodone hcl, pantoprazole, potassium chloride er, and sumatriptan.  Pharmacotherapy (Medications Ordered): Meds ordered this encounter  Medications  . Oxycodone HCl 10 MG TABS    Sig: Take 1 tablet (10 mg total) by mouth every 6 (six) hours as needed. Must last 30 days    Dispense:  120 tablet    Refill:  0    Chronic Pain: STOP Act (Not applicable) Fill 1 day early if closed on refill date. Do not fill until: 07/26/2019. To last until: 08/25/2019. Avoid benzodiazepines within 8 hours of opioids  . NONFORMULARY OR COMPOUNDED ITEM    Sig: Apply 1-2 mLs topically 4 (four) times daily as needed. 10% Ketamine/2% Cyclobenzaprine/6% Gabapentin Cream    Dispense:  240 each    Refill:  6    Please compound: Ketamine (10%) + Cyclobenzaprine (2%) + Gabapentin Cream (6%) into cream  . Oxycodone HCl 10 MG TABS    Sig: Take 1 tablet (10 mg total) by mouth every 6 (six) hours as needed. Must last 30 days    Dispense:  120 tablet    Refill:  0    Chronic Pain: STOP Act (Not applicable) Fill 1 day early if closed on refill date. Do not fill until: 08/25/2019. To last until: 09/24/2019. Avoid benzodiazepines within 8 hours of opioids  . Oxycodone HCl 10 MG TABS    Sig: Take 1 tablet (10 mg total) by mouth every 6 (six) hours as needed. Must last 30 days     Dispense:  120 tablet    Refill:  0    Chronic Pain: STOP Act (Not applicable) Fill 1 day early if closed on refill date. Do not fill until: 09/24/2019. To last until: 10/24/2019. Avoid benzodiazepines within 8 hours of opioids   Orders:  Orders Placed This Encounter  Procedures  . ToxASSURE Select 13 (MW), Urine    Volume: 30 ml(s). Minimum 3 ml of urine is needed. Document temperature of fresh sample. Indications: Long term (current) use of opiate analgesic (  Z79.891)    Order Specific Question:   Release to patient    Answer:   Immediate   Follow-up plan:   Return in about 3 months (around 10/20/2019) for (F2F), (MM).      Interventional therapies: Planned, scheduled, and/or pending:   None at this time. Claims to have anaphylactic allergy to Local Anesthetics. He was sent for testing and shown/proven not to be allergic to Lidocaine.   Considering:   None at this time. Claims to have anaphylactic allergy to Local Anesthetics. He was sent for testing and shown/proven not to be allergic to Lidocaine.   Palliative PRN treatment(s):   None at this time. Claims to have anaphylactic allergy to Local Anesthetics. He was sent for testing and shown/proven not to be allergic to Lidocaine.     Recent Visits No visits were found meeting these conditions.  Showing recent visits within past 90 days and meeting all other requirements   Today's Visits Date Type Provider Dept  07/26/19 Telemedicine [name], MD [facility]  Showing today's visits and meeting all other requirements   Future Appointments No visits were found meeting these conditions.  Showing future appointments within next 90 days and meeting all other requirements   I discussed the assessment and treatment plan with the patient. The patient was provided an opportunity to ask questions and all were answered. The patient agreed with the plan and demonstrated an understanding of the instructions.  Patient  advised to call back or seek an in-person evaluation if the symptoms or condition worsens.  Duration of encounter: 12 minutes.  Note by: Gaspar Cola, MD Date: 07/26/2019; Time: 8:56 AM

## 2019-07-26 ENCOUNTER — Ambulatory Visit: Payer: Medicare Other | Attending: Pain Medicine | Admitting: Pain Medicine

## 2019-07-26 ENCOUNTER — Other Ambulatory Visit: Payer: Self-pay

## 2019-07-26 DIAGNOSIS — G894 Chronic pain syndrome: Secondary | ICD-10-CM

## 2019-07-26 DIAGNOSIS — M5442 Lumbago with sciatica, left side: Secondary | ICD-10-CM

## 2019-07-26 DIAGNOSIS — G8929 Other chronic pain: Secondary | ICD-10-CM

## 2019-07-26 DIAGNOSIS — M79605 Pain in left leg: Secondary | ICD-10-CM

## 2019-07-26 DIAGNOSIS — M79601 Pain in right arm: Secondary | ICD-10-CM

## 2019-07-26 DIAGNOSIS — M79602 Pain in left arm: Secondary | ICD-10-CM

## 2019-07-26 DIAGNOSIS — M5441 Lumbago with sciatica, right side: Secondary | ICD-10-CM

## 2019-07-26 DIAGNOSIS — Z79899 Other long term (current) drug therapy: Secondary | ICD-10-CM | POA: Diagnosis not present

## 2019-07-26 MED ORDER — NONFORMULARY OR COMPOUNDED ITEM: 1.0000 mL | Freq: Four times a day (QID) | 6 refills | Status: DC | PRN

## 2019-07-26 MED ORDER — OXYCODONE HCL 10 MG PO TABS: 10.0000 mg | ORAL_TABLET | Freq: Four times a day (QID) | ORAL | 0 refills | Status: DC | PRN

## 2019-07-30 ENCOUNTER — Ambulatory Visit (INDEPENDENT_AMBULATORY_CARE_PROVIDER_SITE_OTHER): Payer: Medicare Other | Admitting: Pharmacist

## 2019-07-30 DIAGNOSIS — I700 Atherosclerosis of aorta: Secondary | ICD-10-CM

## 2019-07-30 DIAGNOSIS — N181 Chronic kidney disease, stage 1: Secondary | ICD-10-CM | POA: Diagnosis not present

## 2019-07-30 DIAGNOSIS — I10 Essential (primary) hypertension: Secondary | ICD-10-CM

## 2019-07-30 DIAGNOSIS — E1122 Type 2 diabetes mellitus with diabetic chronic kidney disease: Secondary | ICD-10-CM

## 2019-07-30 NOTE — Chronic Care Management (AMB) (Signed)
Chronic Care Management   Follow Up Note   07/30/2019 Name: Kristopher [name] MRN: [id number] DOB: August 09, 1960  Referred by: [name], DO Reason for referral : Chronic Care Management (Medication Management)   Kristopher [name] is a 59 y.o. year old male who is a primary care patient of [name], DO. The CCM team was consulted for assistance with chronic disease management and care coordination needs.    Contacted patient for medication management review.   Review of patient status, including review of consultants reports, relevant laboratory and other test results, and collaboration with appropriate care team members and the patient's provider was performed as part of comprehensive patient evaluation and provision of chronic care management services.    SDOH (Social Determinants of Health) assessments performed: No See Care Plan activities for detailed interventions related to [facility])     Outpatient Encounter Medications as of 07/30/2019  Medication Sig Note  . Accu-Chek FastClix Lancets MISC USE TO TEST BLOOD SUGAR 2X A DAY   . ACCU-CHEK GUIDE test strip USE TO TEST BLOOD SUGAR 2X A DAY   . amLODipine (NORVASC) 5 MG tablet Take 1 tablet (5 mg total) by mouth 2 (two) times daily.   . ARNUITY ELLIPTA 100 MCG/ACT AEPB Inhale 1 puff into the lungs daily.   [name] atorvastatin (LIPITOR) 10 MG tablet Take 1 tablet (10 mg total) by mouth daily at 6 PM.   . hydrALAZINE (APRESOLINE) 100 MG tablet Take 1 tablet (100 mg total) by mouth 2 (two) times daily.   . metFORMIN (GLUCOPHAGE) 500 MG tablet Take 1 tablet (500 mg total) by mouth 2 (two) times daily with a meal. (Patient taking differently: Take 1,000 mg by mouth 2 (two) times daily with a meal. )   . metoprolol succinate (TOPROL-XL) 100 MG 24 hr tablet TAKE 1 TABLET BY MOUTH TWICE A DAY. *TAKE WITH THE 25MG *   . metoprolol tartrate (LOPRESSOR) 50 MG tablet TAKE 1 TABLET BY MOUTH TWICE A DAY   . montelukast (SINGULAIR) 10 MG tablet  Take 10 mg by mouth daily.   . NONFORMULARY OR COMPOUNDED ITEM Apply 1-2 mLs topically 4 (four) times daily as needed. 10% Ketamine/2% Cyclobenzaprine/6% Gabapentin Cream   . nortriptyline (PAMELOR) 25 MG capsule Take 2 capsules (50 mg total) by mouth at bedtime.   . Oxycodone HCl 10 MG TABS Take 1 tablet (10 mg total) by mouth every 6 (six) hours as needed. Must last 30 days 07/26/2019: FUTURE Prescription. (NOT a DUPLICATE!!) >>>DO NOT DELETE<<< (even if Expired!) See Care Coordination Note from [facility] (Dr. Dossie Arbour)    . pantoprazole (PROTONIX) 40 MG tablet Take 1 tablet (40 mg total) by mouth daily.   . Potassium Chloride ER 20 MEQ TBCR TAKE 1 TABLET BY MOUTH EVERY DAY   . SUMAtriptan (IMITREX) 50 MG tablet TAKE 1 TAB AT ONSET OF MIGRAINE. MAY REPEAT IN 2 HOURS IF HEADACHE PERSISTS OR RECURS.   [name] lidocaine (LIDODERM) 5 % Place 1 patch onto the skin daily. Remove & Discard patch within 12 hours or as directed by MD   . Derrill Memo ON 08/25/2019] Oxycodone HCl 10 MG TABS Take 1 tablet (10 mg total) by mouth every 6 (six) hours as needed. Must last 30 days 07/26/2019: FUTURE Prescription. (NOT a DUPLICATE!!) >>>DO NOT DELETE<<< (even if Expired!) See Care Coordination Note from [facility] (Dr. Dossie Arbour)  . [START ON 09/24/2019] Oxycodone HCl 10 MG TABS Take 1 tablet (10 mg total) by mouth  every 6 (six) hours as needed. Must last 30 days 07/26/2019: FUTURE Prescription. (NOT a DUPLICATE!!) >>>DO NOT DELETE<<< (even if Expired!) See Care Coordination Note from [facility] (Dr. Dossie Arbour)  . [DISCONTINUED] amiodarone (PACERONE) 200 MG tablet Take 1 tablet (200 mg total) by mouth 2 (two) times daily. (Patient taking differently: Take 200 mg by mouth daily. Reduced to daily per Dr. Saralyn Pilar)    Facility-Administered Encounter Medications as of 07/30/2019  Medication  . 0.9 %  sodium chloride infusion     Objective:   Goals Addressed            This Visit's Progress      Patient Stated   . PharmD "I don't like taking medications" (pt-stated)       Current Barriers:  . Polypharmacy; complex patient with multiple comorbidities including recent L thoracotomy and L lower lobectomy (admission 11/12-11/23); new onset Afib during hospitalization; hx tobacco abuse, HTN, migraines . Notes that he still needs a prescription knee brace o T2DM: metformin 1000 mg BID; notes sugars remain well controlled, fastings ~100-120s o HTN: currently on amlodipine 5 mg BID, hydralazine 100 mg BID, metoprolol succinate 100 mg + succinate 25 mg + tartrate 50 mg BID. Reports he has been out of succinate 25 mg tab for ~2-3 days, but has not gotten a refill. Reports recent BP 140s/90s.  o ASCVD risk reduction: atorvastatin 10 mg daily o Chronic pain: nortriptyline 50 mg QPM; oxycodone IR 5 mg, Q4-6H PRN (taking 3-4 doses daily); wonders why his mouth is so dry o OSA on CPAP: reports he has not been using CPAP faithfully as the mask often falls off.   Pharmacist Clinical Goal(s):  [name] Over the next 90 days, patient will work with PharmD and provider towards optimized medication management  Interventions: . Comprehensive medication review performed, medication list updated in electronic medical record . Inter-disciplinary care team collaboration (see longitudinal plan of care) . Contacted [facility] to clarify intended metoprolol prescription for patient.  . Educated on benefits of treating OSA via appropriate CPAP use on cardiovascular health. Will collaborate w/ RN CM for education on CPAP troubleshooting . Educated on anticholinergic benefits of nortriptyline. Encouraged him to discuss this with pain management. However, he has been on this medication and this dose for a while, and only indicates the dry mouth recently.   Patient Self Care Activities:  . Patient will take medications as prescribed  Please see past updates related to this goal by clicking on the "Past Updates" button in  the selected goal          Plan:  - Will follow up w/ Cardiology next week  Catie Darnelle Maffucci, PharmD, [place] [phone]

## 2019-07-30 NOTE — Chronic Care Management (AMB) (Signed)
Chronic Care Management   Follow Up Note   07/30/2019 Name: LAVERT MATOUSEK MRN: [id number] DOB: [birth year]  Referred by: [name], DO Reason for referral : Chronic Care Management (Medication Management)   KOLE HILYARD is a 59 y.o. year old male who is a primary care patient of [name], DO. The CCM team was consulted for assistance with chronic disease management and care coordination needs.    Contacted patient for medication management review.   Review of patient status, including review of consultants reports, relevant laboratory and other test results, and collaboration with appropriate care team members and the patient's provider was performed as part of comprehensive patient evaluation and provision of chronic care management services.    SDOH (Social Determinants of Health) assessments performed: Yes See Care Plan activities for detailed interventions related to [facility])     Outpatient Encounter Medications as of 07/30/2019  Medication Sig Note  . Accu-Chek FastClix Lancets MISC USE TO TEST BLOOD SUGAR 2X A DAY   . ACCU-CHEK GUIDE test strip USE TO TEST BLOOD SUGAR 2X A DAY   . amLODipine (NORVASC) 5 MG tablet Take 1 tablet (5 mg total) by mouth 2 (two) times daily.   . ARNUITY ELLIPTA 100 MCG/ACT AEPB Inhale 1 puff into the lungs daily.   [name] atorvastatin (LIPITOR) 10 MG tablet Take 1 tablet (10 mg total) by mouth daily at 6 PM.   . hydrALAZINE (APRESOLINE) 100 MG tablet Take 1 tablet (100 mg total) by mouth 2 (two) times daily.   . metFORMIN (GLUCOPHAGE) 500 MG tablet Take 1 tablet (500 mg total) by mouth 2 (two) times daily with a meal. (Patient taking differently: Take 1,000 mg by mouth 2 (two) times daily with a meal. )   . metoprolol succinate (TOPROL-XL) 100 MG 24 hr tablet TAKE 1 TABLET BY MOUTH TWICE A DAY. *TAKE WITH THE 25MG *   . metoprolol tartrate (LOPRESSOR) 50 MG tablet TAKE 1 TABLET BY MOUTH TWICE A DAY   . montelukast (SINGULAIR) 10 MG tablet  Take 10 mg by mouth daily.   . NONFORMULARY OR COMPOUNDED ITEM Apply 1-2 mLs topically 4 (four) times daily as needed. 10% Ketamine/2% Cyclobenzaprine/6% Gabapentin Cream   . nortriptyline (PAMELOR) 25 MG capsule Take 2 capsules (50 mg total) by mouth at bedtime.   . Oxycodone HCl 10 MG TABS Take 1 tablet (10 mg total) by mouth every 6 (six) hours as needed. Must last 30 days 07/26/2019: FUTURE Prescription. (NOT a DUPLICATE!!) >>>DO NOT DELETE<<< (even if Expired!) See Care Coordination Note from [facility] (Dr. Dossie Arbour)    . pantoprazole (PROTONIX) 40 MG tablet Take 1 tablet (40 mg total) by mouth daily.   . Potassium Chloride ER 20 MEQ TBCR TAKE 1 TABLET BY MOUTH EVERY DAY   . SUMAtriptan (IMITREX) 50 MG tablet TAKE 1 TAB AT ONSET OF MIGRAINE. MAY REPEAT IN 2 HOURS IF HEADACHE PERSISTS OR RECURS.   [name] lidocaine (LIDODERM) 5 % Place 1 patch onto the skin daily. Remove & Discard patch within 12 hours or as directed by MD   . Derrill Memo ON 08/25/2019] Oxycodone HCl 10 MG TABS Take 1 tablet (10 mg total) by mouth every 6 (six) hours as needed. Must last 30 days 07/26/2019: FUTURE Prescription. (NOT a DUPLICATE!!) >>>DO NOT DELETE<<< (even if Expired!) See Care Coordination Note from [facility] (Dr. Dossie Arbour)  . [START ON 09/24/2019] Oxycodone HCl 10 MG TABS Take 1 tablet (10 mg total) by mouth  every 6 (six) hours as needed. Must last 30 days 07/26/2019: FUTURE Prescription. (NOT a DUPLICATE!!) >>>DO NOT DELETE<<< (even if Expired!) See Care Coordination Note from [facility] (Dr. Dossie Arbour)  . [DISCONTINUED] amiodarone (PACERONE) 200 MG tablet Take 1 tablet (200 mg total) by mouth 2 (two) times daily. (Patient taking differently: Take 200 mg by mouth daily. Reduced to daily per Dr. Saralyn Pilar)    Facility-Administered Encounter Medications as of 07/30/2019  Medication  . 0.9 %  sodium chloride infusion     Objective:   Goals Addressed            This Visit's Progress      Patient Stated   . PharmD "I don't like taking medications" (pt-stated)       Current Barriers:  . Polypharmacy; complex patient with multiple comorbidities including recent L thoracotomy and L lower lobectomy (admission 11/12-11/23); new onset Afib during hospitalization; hx tobacco abuse, HTN, migraines . Notes that he still needs a prescription knee brace o T2DM: metformin 1000 mg BID; notes sugars remain well controlled, fastings ~100-120s o HTN: currently on amlodipine 5 mg BID, hydralazine 100 mg BID, metoprolol succinate 100 mg + succinate 25 mg + tartrate 50 mg BID. Reports he has been out of succinate 25 mg tab for ~2-3 days, but has not gotten a refill. Reports recent BP 140s/90s.  o ASCVD risk reduction: atorvastatin 10 mg daily o Chronic pain: nortriptyline 50 mg QPM; oxycodone IR 5 mg, Q4-6H PRN (taking 3-4 doses daily); wonders why his mouth is so dry o OSA on CPAP: reports he has not been using CPAP faithfully as the mask often falls off.   Pharmacist Clinical Goal(s):  [name] Over the next 90 days, patient will work with PharmD and provider towards optimized medication management  Interventions: . Comprehensive medication review performed, medication list updated in electronic medical record . Inter-disciplinary care team collaboration (see longitudinal plan of care) . Contacted [facility] to clarify intended metoprolol prescription for patient.  . Educated on benefits of treating OSA via appropriate CPAP use on cardiovascular health. Will collaborate w/ RN CM for education on CPAP troubleshooting . Educated on anticholinergic benefits of nortriptyline. Encouraged him to discuss this with pain management. However, he has been on this medication and this dose for a while, and only indicates the dry mouth recently.   Patient Self Care Activities:  . Patient will take medications as prescribed  Please see past updates related to this goal by clicking on the "Past Updates" button in  the selected goal          Plan:  - Scheduled f/u call in ~ 8 weeks  Catie Darnelle Maffucci, PharmD, [place] [phone]

## 2019-07-30 NOTE — Patient Instructions (Signed)
Visit Information  Goals Addressed            This Visit's Progress     Patient Stated   . PharmD "I don't like taking medications" (pt-stated)       Current Barriers:  . Polypharmacy; complex patient with multiple comorbidities including recent L thoracotomy and L lower lobectomy (admission 11/12-11/23); new onset Afib during hospitalization; hx tobacco abuse, HTN, migraines . Notes that he still needs a prescription knee brace o T2DM: metformin 1000 mg BID; notes sugars remain well controlled, fastings ~100-120s o HTN: currently on amlodipine 5 mg BID, hydralazine 100 mg BID, metoprolol succinate 100 mg + succinate 25 mg + tartrate 50 mg BID. Reports he has been out of succinate 25 mg tab for ~2-3 days, but has not gotten a refill. Reports recent BP 140s/90s.  o ASCVD risk reduction: atorvastatin 10 mg daily o Chronic pain: nortriptyline 50 mg QPM; oxycodone IR 5 mg, Q4-6H PRN (taking 3-4 doses daily); wonders why his mouth is so dry o OSA on CPAP: reports he has not been using CPAP faithfully as the mask often falls off.   Pharmacist Clinical Goal(s):  [name] Over the next 90 days, patient will work with PharmD and provider towards optimized medication management  Interventions: . Comprehensive medication review performed, medication list updated in electronic medical record . Inter-disciplinary care team collaboration (see longitudinal plan of care) . Contacted [facility] to clarify intended metoprolol prescription for patient.  . Educated on benefits of treating OSA via appropriate CPAP use on cardiovascular health. Will collaborate w/ RN CM for education on CPAP troubleshooting . Educated on anticholinergic benefits of nortriptyline. Encouraged him to discuss this with pain management. However, he has been on this medication and this dose for a while, and only indicates the dry mouth recently.   Patient Self Care Activities:  . Patient will take medications as prescribed  Please  see past updates related to this goal by clicking on the "Past Updates" button in the selected goal         Patient verbalizes understanding of instructions provided today.  Plan:  - Will follow up w/ Cardiology next week  Catie Darnelle Maffucci, PharmD, [place] [phone]

## 2019-08-02 DIAGNOSIS — G4733 Obstructive sleep apnea (adult) (pediatric): Secondary | ICD-10-CM | POA: Diagnosis not present

## 2019-08-03 ENCOUNTER — Ambulatory Visit: Payer: Self-pay | Admitting: Pharmacist

## 2019-08-03 ENCOUNTER — Other Ambulatory Visit: Payer: Self-pay | Admitting: Family Medicine

## 2019-08-03 DIAGNOSIS — I10 Essential (primary) hypertension: Secondary | ICD-10-CM

## 2019-08-03 MED ORDER — METOPROLOL SUCCINATE ER 100 MG PO TB24: 200.0000 mg | ORAL_TABLET | Freq: Every day | ORAL | 1 refills | Status: DC

## 2019-08-03 MED ORDER — METOPROLOL SUCCINATE ER 100 MG PO TB24: 200.0000 mg | ORAL_TABLET | Freq: Two times a day (BID) | ORAL | 1 refills | Status: DC

## 2019-08-03 NOTE — Chronic Care Management (AMB) (Signed)
Chronic Care Management   Follow Up Note   08/03/2019 Name: Kristopher [name] MRN: [id number] DOB: 1960/07/09  Referred by: [name], DO Reason for referral : Chronic Care Management (Medication Management)   Kristopher [name] is a 59 y.o. year old male who is a primary care patient of [name], DO. The CCM team was consulted for assistance with chronic disease management and care coordination needs.    Care coordination completed today.   Review of patient status, including review of consultants reports, relevant laboratory and other test results, and collaboration with appropriate care team members and the patient's provider was performed as part of comprehensive patient evaluation and provision of chronic care management services.    SDOH (Social Determinants of Health) assessments performed: No See Care Plan activities for detailed interventions related to [facility])     Outpatient Encounter Medications as of 08/03/2019  Medication Sig Note  . Accu-Chek FastClix Lancets MISC USE TO TEST BLOOD SUGAR 2X A DAY   . ACCU-CHEK GUIDE test strip USE TO TEST BLOOD SUGAR 2X A DAY   . amLODipine (NORVASC) 5 MG tablet Take 1 tablet (5 mg total) by mouth 2 (two) times daily.   . ARNUITY ELLIPTA 100 MCG/ACT AEPB Inhale 1 puff into the lungs daily.   [name] atorvastatin (LIPITOR) 10 MG tablet Take 1 tablet (10 mg total) by mouth daily at 6 PM.   . hydrALAZINE (APRESOLINE) 100 MG tablet Take 1 tablet (100 mg total) by mouth 2 (two) times daily.   [name] lidocaine (LIDODERM) 5 % Place 1 patch onto the skin daily. Remove & Discard patch within 12 hours or as directed by MD   . metFORMIN (GLUCOPHAGE) 500 MG tablet Take 1 tablet (500 mg total) by mouth 2 (two) times daily with a meal. (Patient taking differently: Take 1,000 mg by mouth 2 (two) times daily with a meal. )   . metoprolol succinate (TOPROL-XL) 100 MG 24 hr tablet Take 2 tablets (200 mg total) by mouth in the morning and at bedtime. Take with  or immediately following a meal.   . montelukast (SINGULAIR) 10 MG tablet Take 10 mg by mouth daily.   . NONFORMULARY OR COMPOUNDED ITEM Apply 1-2 mLs topically 4 (four) times daily as needed. 10% Ketamine/2% Cyclobenzaprine/6% Gabapentin Cream   . nortriptyline (PAMELOR) 25 MG capsule Take 2 capsules (50 mg total) by mouth at bedtime.   . Oxycodone HCl 10 MG TABS Take 1 tablet (10 mg total) by mouth every 6 (six) hours as needed. Must last 30 days 07/26/2019: FUTURE Prescription. (NOT a DUPLICATE!!) >>>DO NOT DELETE<<< (even if Expired!) See Care Coordination Note from [facility] (Dr. Dossie Arbour)    . [START ON 08/25/2019] Oxycodone HCl 10 MG TABS Take 1 tablet (10 mg total) by mouth every 6 (six) hours as needed. Must last 30 days 07/26/2019: FUTURE Prescription. (NOT a DUPLICATE!!) >>>DO NOT DELETE<<< (even if Expired!) See Care Coordination Note from [facility] (Dr. Dossie Arbour)  . [START ON 09/24/2019] Oxycodone HCl 10 MG TABS Take 1 tablet (10 mg total) by mouth every 6 (six) hours as needed. Must last 30 days 07/26/2019: FUTURE Prescription. (NOT a DUPLICATE!!) >>>DO NOT DELETE<<< (even if Expired!) See Care Coordination Note from [facility] (Dr. Dossie Arbour)  . pantoprazole (PROTONIX) 40 MG tablet Take 1 tablet (40 mg total) by mouth daily.   . Potassium Chloride ER 20 MEQ TBCR TAKE 1 TABLET BY MOUTH EVERY DAY   . SUMAtriptan (  IMITREX) 50 MG tablet TAKE 1 TAB AT ONSET OF MIGRAINE. MAY REPEAT IN 2 HOURS IF HEADACHE PERSISTS OR RECURS.    Facility-Administered Encounter Medications as of 08/03/2019  Medication  . 0.9 %  sodium chloride infusion     Objective:   Goals Addressed            This Visit's Progress     Patient Stated   . PharmD "I don't like taking medications" (pt-stated)       Current Barriers:  . Polypharmacy; complex patient with multiple comorbidities including recent L thoracotomy and L lower lobectomy (admission 11/12-11/23); new onset Afib during  hospitalization; hx tobacco abuse, HTN, migraines o Heard back from [place]. They had patient documented as taking metoprolol tartrate 50 mg BID . Notes that he still needs a prescription knee brace o T2DM: metformin 1000 mg BID; notes sugars remain well controlled, fastings ~100-120s o HTN: currently on amlodipine 5 mg BID, hydralazine 100 mg BID, metoprolol succinate 100 mg + succinate 25 mg + tartrate 50 mg BID; decided to switch to metoprolol succinate 200 mg BID o ASCVD risk reduction: atorvastatin 10 mg daily o Chronic pain: nortriptyline 50 mg QPM; oxycodone IR 5 mg, Q4-6H PRN (taking 3-4 doses daily); wonders why his mouth is so dry o OSA on CPAP: reports he has not been using CPAP faithfully as the mask often falls off.   Pharmacist Clinical Goal(s):  [name] Over the next 90 days, patient will work with PharmD and provider towards optimized medication management  Interventions: . Collaborated w/ PCP to streamline antihypertensive regimen to metoprolol succinate 200 mg BID. Will collaborate w/ office staff to schedule patient for f/u in ~ 2 weeks in office for BP check . Spoke with patient's wife, Rip Harbour. Reviewed changes as above. She verbalized understanding.   Patient Self Care Activities:  . Patient will take medications as prescribed  Please see past updates related to this goal by clicking on the "Past Updates" button in the selected goal          Plan:  - Will outreach as previously scheduled  Catie Darnelle Maffucci, PharmD, [place] [phone]

## 2019-08-03 NOTE — Patient Instructions (Signed)
Visit Information  Goals Addressed            This Visit's Progress     Patient Stated   . PharmD "I don't like taking medications" (pt-stated)       Current Barriers:  . Polypharmacy; complex patient with multiple comorbidities including recent L thoracotomy and L lower lobectomy (admission 11/12-11/23); new onset Afib during hospitalization; hx tobacco abuse, HTN, migraines o Heard back from [place]. They had patient documented as taking metoprolol tartrate 50 mg BID . Notes that he still needs a prescription knee brace o T2DM: metformin 1000 mg BID; notes sugars remain well controlled, fastings ~100-120s o HTN: currently on amlodipine 5 mg BID, hydralazine 100 mg BID, metoprolol succinate 100 mg + succinate 25 mg + tartrate 50 mg BID; decided to switch to metoprolol succinate 200 mg BID o ASCVD risk reduction: atorvastatin 10 mg daily o Chronic pain: nortriptyline 50 mg QPM; oxycodone IR 5 mg, Q4-6H PRN (taking 3-4 doses daily); wonders why his mouth is so dry o OSA on CPAP: reports he has not been using CPAP faithfully as the mask often falls off.   Pharmacist Clinical Goal(s):  [name] Over the next 90 days, patient will work with PharmD and provider towards optimized medication management  Interventions: . Collaborated w/ PCP to streamline antihypertensive regimen to metoprolol succinate 200 mg BID. Will collaborate w/ office staff to schedule patient for f/u in ~ 2 weeks in office for BP check . Spoke with patient's wife, Rip Harbour. Reviewed changes as above. She verbalized understanding.   Patient Self Care Activities:  . Patient will take medications as prescribed  Please see past updates related to this goal by clicking on the "Past Updates" button in the selected goal         Patient verbalizes understanding of instructions provided today.   Plan:  - Will outreach as previously scheduled  Catie Darnelle Maffucci, PharmD, [place] [phone]

## 2019-08-05 ENCOUNTER — Ambulatory Visit: Payer: Self-pay | Admitting: General Practice

## 2019-08-05 NOTE — Chronic Care Management (AMB) (Signed)
°  Chronic Care Management   Outreach Note  08/05/2019 Name: Kristopher [name] MRN: [id number] DOB: [birth year]  Referred by: [name], DO Reason for referral : Chronic Care Management (CPAP needs and other chronic conditions)   An unsuccessful telephone outreach was attempted today. The patient was referred to the case management team for assistance with care management and care coordination.   Follow Up Plan: A HIPPA compliant phone message was left for the patient providing contact information and requesting a return call.   Noreene Larsson RN, MSN, [facility] Mobile: [phone]

## 2019-08-09 ENCOUNTER — Other Ambulatory Visit: Payer: Self-pay | Admitting: Family Medicine

## 2019-08-11 ENCOUNTER — Ambulatory Visit (INDEPENDENT_AMBULATORY_CARE_PROVIDER_SITE_OTHER): Payer: Medicare Other

## 2019-08-11 VITALS — Ht 70.0 in | Wt 235.0 lb

## 2019-08-11 DIAGNOSIS — Z0000 Encounter for general adult medical examination without abnormal findings: Secondary | ICD-10-CM

## 2019-08-11 NOTE — Patient Instructions (Signed)
Kristopher [name] , Thank you for taking time to come for your Medicare Wellness Visit. I appreciate your ongoing commitment to your health goals. Please review the following plan we discussed and let me know if I can assist you in the future.   Screening recommendations/referrals: Colonoscopy: completed 2019, due 2022 Recommended yearly ophthalmology/optometry visit for glaucoma screening and checkup Recommended yearly dental visit for hygiene and checkup  Vaccinations: Influenza vaccine: due 11/2019 Pneumococcal vaccine: completed 2018, due at age 53 Tdap vaccine: completed 2019 Shingles vaccine: shingrix eligible, check with your insurance for coverage    Covid-19: declined   Advanced directives: Please bring a copy of your health care power of attorney and living will to the office at your convenience.  Conditions/risks identified: diabetic, already in contact with chronic care management team.  Smoking history: If you wish to quit smoking, help is available. For free tobacco cessation program offerings call the [facility] at [phone] or Live Well Line at 6236251013. You may also visit [email or web address] or email [email or web address] for more information on other programs.   Next appointment: Follow up in one year for your annual wellness visit 08/14/2020 at 815am  Preventive Care 40-64 Years, Male Preventive care refers to lifestyle choices and visits with your health care provider that can promote health and wellness. What does preventive care include?  A yearly physical exam. This is also called an annual well check.  Dental exams once or twice a year.  Routine eye exams. Ask your health care provider how often you should have your eyes checked.  Personal lifestyle choices, including:  Daily care of your teeth and gums.  Regular physical activity.  Eating a healthy diet.  Avoiding tobacco and drug use.  Limiting alcohol use.  Practicing safe  sex.  Taking low-dose aspirin every day starting at age 83. What happens during an annual well check? The services and screenings done by your health care provider during your annual well check will depend on your age, overall health, lifestyle risk factors, and family history of disease. Counseling  Your health care provider may ask you questions about your:  Alcohol use.  Tobacco use.  Drug use.  Emotional well-being.  Home and relationship well-being.  Sexual activity.  Eating habits.  Work and work Statistician. Screening  You may have the following tests or measurements:  Height, weight, and BMI.  Blood pressure.  Lipid and cholesterol levels. These may be checked every 5 years, or more frequently if you are over 28 years old.  Skin check.  Lung cancer screening. You may have this screening every year starting at age 74 if you have a 30-pack-year history of smoking and currently smoke or have quit within the past 15 years.  Fecal occult blood test (FOBT) of the stool. You may have this test every year starting at age 28.  Flexible sigmoidoscopy or colonoscopy. You may have a sigmoidoscopy every 5 years or a colonoscopy every 10 years starting at age 8.  Prostate cancer screening. Recommendations will vary depending on your family history and other risks.  Hepatitis C blood test.  Hepatitis B blood test.  Sexually transmitted disease (STD) testing.  Diabetes screening. This is done by checking your blood sugar (glucose) after you have not eaten for a while (fasting). You may have this done every 1-3 years. Discuss your test results, treatment options, and if necessary, the need for more tests with your health care provider. Vaccines  Your health care provider  may recommend certain vaccines, such as:  Influenza vaccine. This is recommended every year.  Tetanus, diphtheria, and acellular pertussis (Tdap, Td) vaccine. You may need a Td booster every 10  years.  Zoster vaccine. You may need this after age 50.  Pneumococcal 13-valent conjugate (PCV13) vaccine. You may need this if you have certain conditions and have not been vaccinated.  Pneumococcal polysaccharide (PPSV23) vaccine. You may need one or two doses if you smoke cigarettes or if you have certain conditions. Talk to your health care provider about which screenings and vaccines you need and how often you need them. This information is not intended to replace advice given to you by your health care provider. Make sure you discuss any questions you have with your health care provider. Document Released: 03/17/2015 Document Revised: 11/08/2015 Document Reviewed: 12/20/2014 Elsevier Interactive Patient Education  2017 [employer] Prevention in the Home Falls can cause injuries. They can happen to people of all ages. There are many things you can do to make your home safe and to help prevent falls. What can I do on the outside of my home?  Regularly fix the edges of walkways and driveways and fix any cracks.  Remove anything that might make you trip as you walk through a door, such as a raised step or threshold.  Trim any bushes or trees on the path to your home.  Use bright outdoor lighting.  Clear any walking paths of anything that might make someone trip, such as rocks or tools.  Regularly check to see if handrails are loose or broken. Make sure that both sides of any steps have handrails.  Any raised decks and porches should have guardrails on the edges.  Have any leaves, snow, or ice cleared regularly.  Use sand or salt on walking paths during winter.  Clean up any spills in your garage right away. This includes oil or grease spills. What can I do in the bathroom?  Use night lights.  Install grab bars by the toilet and in the tub and shower. Do not use towel bars as grab bars.  Use non-skid mats or decals in the tub or shower.  If you need to sit down in  the shower, use a plastic, non-slip stool.  Keep the floor dry. Clean up any water that spills on the floor as soon as it happens.  Remove soap buildup in the tub or shower regularly.  Attach bath mats securely with double-sided non-slip rug tape.  Do not have throw rugs and other things on the floor that can make you trip. What can I do in the bedroom?  Use night lights.  Make sure that you have a light by your bed that is easy to reach.  Do not use any sheets or blankets that are too big for your bed. They should not hang down onto the floor.  Have a firm chair that has side arms. You can use this for support while you get dressed.  Do not have throw rugs and other things on the floor that can make you trip. What can I do in the kitchen?  Clean up any spills right away.  Avoid walking on wet floors.  Keep items that you use a lot in easy-to-reach places.  If you need to reach something above you, use a strong step stool that has a grab bar.  Keep electrical cords out of the way.  Do not use floor polish or wax that makes  floors slippery. If you must use wax, use non-skid floor wax.  Do not have throw rugs and other things on the floor that can make you trip. What can I do with my stairs?  Do not leave any items on the stairs.  Make sure that there are handrails on both sides of the stairs and use them. Fix handrails that are broken or loose. Make sure that handrails are as long as the stairways.  Check any carpeting to make sure that it is firmly attached to the stairs. Fix any carpet that is loose or worn.  Avoid having throw rugs at the top or bottom of the stairs. If you do have throw rugs, attach them to the floor with carpet tape.  Make sure that you have a light switch at the top of the stairs and the bottom of the stairs. If you do not have them, ask someone to add them for you. What else can I do to help prevent falls?  Wear shoes that:  Do not have high  heels.  Have rubber bottoms.  Are comfortable and fit you well.  Are closed at the toe. Do not wear sandals.  If you use a stepladder:  Make sure that it is fully opened. Do not climb a closed stepladder.  Make sure that both sides of the stepladder are locked into place.  Ask someone to hold it for you, if possible.  Clearly mark and make sure that you can see:  Any grab bars or handrails.  First and last steps.  Where the edge of each step is.  Use tools that help you move around (mobility aids) if they are needed. These include:  Canes.  Walkers.  Scooters.  Crutches.  Turn on the lights when you go into a dark area. Replace any light bulbs as soon as they burn out.  Set up your furniture so you have a clear path. Avoid moving your furniture around.  If any of your floors are uneven, fix them.  If there are any pets around you, be aware of where they are.  Review your medicines with your doctor. Some medicines can make you feel dizzy. This can increase your chance of falling. Ask your doctor what other things that you can do to help prevent falls. This information is not intended to replace advice given to you by your health care provider. Make sure you discuss any questions you have with your health care provider. Document Released: 12/15/2008 Document Revised: 07/27/2015 Document Reviewed: 03/25/2014 Elsevier Interactive Patient Education  2017 [employer].

## 2019-08-11 NOTE — Progress Notes (Signed)
Subjective:   Kristopher [name] is a 59 y.o. male who presents for Medicare Annual/Subsequent preventive examination.  Review of Systems:   Cardiac Risk Factors include: male gender;diabetes mellitus;dyslipidemia;advanced age (>54mn, >>44women);smoking/ tobacco exposure;hypertension     Objective:    Vitals: Ht _0  (1.778 m)   Wt 235 lb (106.6 kg)   BMI 33.72 kg/m   Body mass index is 33.72 kg/m.  Advanced Directives 08/11/2019 02/17/2019 02/11/2019 01/21/2019 01/20/2019 01/15/2019 01/08/2019  Does Patient Have a Medical Advance Directive? _1  No No  Type of Advance Directive Living will;Healthcare Power of Attorney Living will;Healthcare Power of A[place]orn[place]Living will Living will;Healthcare Power of AOppLiving will - -  Does patient want to make changes to medical advance directive? - - - - No - Patient declined - -  Copy of [place] Chart? No - copy requested No - copy requested - - No - copy requested - -  Would patient like information on creating a medical advance directive? - - - - No - Patient declined No - Patient declined No - Patient declined    Tobacco Social History   Tobacco Use  Smoking Status Current Some Day Smoker  . Packs/day: 0.05  . Years: 35.00  . Pack years: 1.75  . Types: Cigarettes  Smokeless Tobacco Never Used  Tobacco Comment   1 pack last 3 days      Ready to quit: Yes Counseling given: Yes Comment: 1 pack last 3 days    Clinical Intake:  Pre-visit preparation completed: Yes  Pain : 0-10 Pain Score: 3  Pain Type: Chronic pain Pain Location: Back Pain Orientation: Left, Lower, Mid, Upper Pain Descriptors / Indicators: Aching Pain Onset: More than a month ago Pain Frequency: Constant Pain Relieving Factors: pain meds  Pain Relieving Factors: pain meds  Nutritional Risks: None Diabetes: Yes CBG done?: No Did pt. bring in CBG  monitor from home?: No  How often do you need to have someone help you when you read instructions, pamphlets, or other written materials from your doctor or pharmacy?: 1 - Never  Nutrition Risk Assessment:  Has the patient had any N/V/D within the last 2 months?  No  Does the patient have any non-healing wounds?  No  Has the patient had any unintentional weight loss or weight gain?  No   Diabetes:  Is the patient diabetic?  Yes  If diabetic, was a CBG obtained today?  Yes  Did the patient bring in their glucometer from home?  Yes  How often do you monitor your CBG's? Twice a day   Financial Strains and Diabetes Management:  Are you having any financial strains with the device, your supplies or your medication? No .  Does the patient want to be seen by Chronic Care Management for management of their diabetes?  yes Would the patient like to be referred to a Nutritionist or for Diabetic Management?  No    Already in contact with CCM team.   Diabetic Exams:  Diabetic Eye Exam:  Overdue for diabetic eye exam. Pt has been advised about the importance in completing this exam. [facility], will have them fax results when completed   Diabetic Foot Exam:Pt has been advised about the importance in completing this exam. Pt is scheduled for diabetic foot exam with PCP  Interpreter Needed?: No  Information entered by ::  Kristopher Moyers,LPN  Past Medical History:  Diagnosis Date  . Allergy   . Arthritis    left foot  . Benign hypertensive kidney disease   . Chronic back pain    Four rods in back  . Diabetes mellitus, type 2 ([place])   . Dyspnea   . GERD (gastroesophageal reflux disease)   . Hypertension   . Malignant neoplasm of lung ([place])   . Migraines    daily   Past Surgical History:  Procedure Laterality Date  . APPENDECTOMY    . BACK SURGERY    . COLONOSCOPY WITH PROPOFOL N/A 02/19/2016   Procedure: COLONOSCOPY WITH PROPOFOL;  Surgeon:  Lucilla Lame, MD;  Location: [place];  Service: Endoscopy;  Laterality: N/A;  . COLONOSCOPY WITH PROPOFOL N/A 01/19/2018   Procedure: COLONOSCOPY WITH PROPOFOL;  Surgeon: Lucilla Lame, MD;  Location: [place];  Service: Endoscopy;  Laterality: N/A;  Diabetic - oral meds  . DG OPERATIVE LEFT HIP ([place] HX)     10/19  . ELECTROMAGNETIC NAVIGATION BROCHOSCOPY Left 11/18/2018   Procedure: ELECTROMAGNETIC NAVIGATION BRONCHOSCOPY;  Surgeon: Tyler Pita, MD;  Location: [facility];  Service: Cardiopulmonary;  Laterality: Left;  . FLEXIBLE BRONCHOSCOPY Bilateral 01/20/2019   Procedure: FLEXIBLE BRONCHOSCOPY;  Surgeon: Ottie Glazier, MD;  Location: [facility];  Service: Thoracic;  Laterality: Bilateral;  . FLEXIBLE BRONCHOSCOPY Bilateral 01/22/2019   Procedure: FLEXIBLE BRONCHOSCOPY;  Surgeon: Ottie Glazier, MD;  Location: [facility];  Service: Thoracic;  Laterality: Bilateral;  . FOOT SURGERY Left    Screws and plates  . JOINT REPLACEMENT Left 12/2017   DR Rudene Christians Hip  . KNEE SURGERY Left    X 2  . LEG SURGERY    . LUNG CANCER SURGERY    . POLYPECTOMY N/A 02/19/2016   Procedure: POLYPECTOMY;  Surgeon: Lucilla Lame, MD;  Location: [place] [facility];  Service: Endoscopy;  Laterality: N/A;  . POLYPECTOMY  01/19/2018   Procedure: POLYPECTOMY;  Surgeon: Lucilla Lame, MD;  Location: [place];  Service: Endoscopy;;  . THORACOTOMY Left 01/14/2019   Procedure: THORACOTOMY MAJOR, LEFT;  Surgeon: Nestor Lewandowsky, MD;  Location: [facility];  Service: General;  Laterality: Left;  . TOTAL HIP ARTHROPLASTY Left 12/02/2017   Procedure: TOTAL HIP ARTHROPLASTY ANTERIOR APPROACH;  Surgeon: Hessie Knows, MD;  Location: [facility];  Service: Orthopedics;  Laterality: Left;  [name] VIDEO BRONCHOSCOPY Left 01/14/2019   Procedure: VIDEO BRONCHOSCOPY WITH FLUORO, LEFT;  Surgeon: Nestor Lewandowsky, MD;  Location: [facility];  Service: General;  Laterality: Left;   Family History  Problem Relation Age of  Onset  . Cancer Father   . Diabetes Sister   . Thrombosis Sister    Social History   Socioeconomic History  . Marital status: Significant Other    Spouse name: Not on file  . Number of children: Not on file  . Years of education: Not on file  . Highest education level: Not on file  Occupational History  . Occupation: disability  Tobacco Use  . Smoking status: Current Some Day Smoker    Packs/day: 0.05    Years: 35.00    Pack years: 1.75    Types: Cigarettes  . Smokeless tobacco: Never Used  . Tobacco comment: 1 pack last 3 days   Substance and Sexual Activity  . Alcohol use: No    Alcohol/week: 0.0 standard drinks  . Drug use: Yes    Types: Oxycodone    Comment: prescribed  . Sexual activity: Yes  Other Topics  Concern  . Not on file  Social History Narrative  . Not on file   Social Determinants of Health   Financial Resource Strain: Low Risk   . Difficulty of Paying Living Expenses: Not hard at all  Food Insecurity: No Food Insecurity  . Worried About Charity fundraiser in the Last Year: Never true  . Ran Out of Food in the Last Year: Never true  Transportation Needs: No Transportation Needs  . Lack of Transportation (Medical): No  . Lack of Transportation (Non-Medical): No  Physical Activity: Insufficiently Active  . Days of Exercise per Week: 4 days  . Minutes of Exercise per Session: 30 min  Stress: No Stress Concern Present  . Feeling of Stress : Not at all  Social Connections: Not Isolated  . Frequency of Communication with Friends and Family: More than three times a week  . Frequency of Social Gatherings with Friends and Family: More than three times a week  . Attends Religious Services: More than 4 times per year  . Active Member of Clubs or Organizations: Yes  . Attends Archivist Meetings: More than 4 times per year  . Marital Status: Living with partner    Outpatient Encounter Medications as of 08/11/2019  Medication Sig  . Accu-Chek  FastClix Lancets MISC USE TO TEST BLOOD SUGAR 2X A DAY  . ACCU-CHEK GUIDE test strip USE TO TEST BLOOD SUGAR 2X A DAY  . amLODipine (NORVASC) 5 MG tablet Take 1 tablet (5 mg total) by mouth 2 (two) times daily.  . ARNUITY ELLIPTA 100 MCG/ACT AEPB Inhale 1 puff into the lungs daily.  [name] atorvastatin (LIPITOR) 10 MG tablet Take 1 tablet (10 mg total) by mouth daily at 6 PM.  . hydrALAZINE (APRESOLINE) 100 MG tablet TAKE 1 TABLET BY MOUTH TWICE A DAY  . lidocaine (LIDODERM) 5 % Place 1 patch onto the skin daily. Remove & Discard patch within 12 hours or as directed by MD  . metFORMIN (GLUCOPHAGE) 500 MG tablet Take 1 tablet (500 mg total) by mouth 2 (two) times daily with a meal. (Patient taking differently: Take 1,000 mg by mouth 2 (two) times daily with a meal. )  . metoprolol succinate (TOPROL-XL) 100 MG 24 hr tablet Take 2 tablets (200 mg total) by mouth in the morning and at bedtime. Take with or immediately following a meal.  . montelukast (SINGULAIR) 10 MG tablet Take 10 mg by mouth daily.  . NONFORMULARY OR COMPOUNDED ITEM Apply 1-2 mLs topically 4 (four) times daily as needed. 10% Ketamine/2% Cyclobenzaprine/6% Gabapentin Cream  . nortriptyline (PAMELOR) 25 MG capsule Take 2 capsules (50 mg total) by mouth at bedtime.  . Oxycodone HCl 10 MG TABS Take 1 tablet (10 mg total) by mouth every 6 (six) hours as needed. Must last 30 days  . pantoprazole (PROTONIX) 40 MG tablet Take 1 tablet (40 mg total) by mouth daily.  . Potassium Chloride ER 20 MEQ TBCR TAKE 1 TABLET BY MOUTH EVERY DAY  . SUMAtriptan (IMITREX) 50 MG tablet TAKE 1 TAB AT ONSET OF MIGRAINE. MAY REPEAT IN 2 HOURS IF HEADACHE PERSISTS OR RECURS.  Derrill Memo ON 08/25/2019] Oxycodone HCl 10 MG TABS Take 1 tablet (10 mg total) by mouth every 6 (six) hours as needed. Must last 30 days (Patient not taking: Reported on 08/11/2019)  . [START ON 09/24/2019] Oxycodone HCl 10 MG TABS Take 1 tablet (10 mg total) by mouth every 6 (six) hours as needed.  Must last  30 days (Patient not taking: Reported on 08/11/2019)   Facility-Administered Encounter Medications as of 08/11/2019  Medication  . 0.9 %  sodium chloride infusion    Activities of Daily Living In your present state of health, do you have any difficulty performing the following activities: 08/11/2019 01/15/2019  Hearing? N N  Comment no hearing aids -  Vision? N N  Comment [facility], glasses -  Difficulty concentrating or making decisions? N N  Walking or climbing stairs? N Y  Dressing or bathing? N Y  Doing errands, shopping? N N  Preparing Food and eating ? N -  Using the Toilet? N -  In the past six months, have you accidently leaked urine? N -  Do you have problems with loss of bowel control? N -  Managing your Medications? N -  Managing your Finances? N -  Housekeeping or managing your Housekeeping? N -  Some recent data might be hidden    Patient Care Team: [name], DO as PCP - General (Family Medicine) Gavin Pound, CMA (Inactive) as Certified Medical Assistant [place], [facility] as Pharmacist (Pharmacist) Telford Nab, RN as Registered Nurse [place], Nobie Putnam, RN as Case Manager (General Practice) [name], MD as Referring Physician (Pain Medicine)   Assessment:   This is a routine wellness examination for Kristopher [name].  Exercise Activities and Dietary recommendations Current Exercise Habits: Home exercise routine, Type of exercise: walking, Time (Minutes): 60, Frequency (Times/Week): 7, Weekly Exercise (Minutes/Week): 420, Intensity: Mild, Exercise limited by: None identified  Goals Addressed   None     Fall Risk Fall Risk  08/11/2019 03/19/2019 02/05/2019 12/03/2018 10/06/2018  Falls in the past year? 0 0 0 0 0  Comment - - - - -  Number falls in past yr: 0 - 0 - 0  Comment - - - - -  Injury with Fall? 0 - 0 - 0  Comment - - - - -  Risk Factor Category  - - - - -  Risk for fall due to : - - - - -  Risk for fall due to:  Comment - - - - -  Follow up - - - - -   FALL RISK PREVENTION PERTAINING TO THE HOME:  Any stairs in or around the home? Yes 3 steps  If so, are there any without handrails? No   Home free of loose throw rugs in walkways, pet beds, electrical cords, etc? Yes  Adequate lighting in your home to reduce risk of falls? Yes   ASSISTIVE DEVICES UTILIZED TO PREVENT FALLS:  Life alert? No  Use of a cane, walker or w/c? No  Grab bars in the bathroom? No  Shower chair or bench in shower? No  Elevated toilet seat or a handicapped toilet? No    TIMED UP AND GO:  Unable to perform    Depression Screen PHQ 2/9 Scores 08/11/2019 10/06/2018 08/12/2018 01/20/2018  PHQ - 2 Score 0 0 0 0  PHQ- 9 Score - - - -  Exception Documentation - - - -    Cognitive Function - not indicated      6CIT Screen 08/04/2017 06/24/2016  What Year? 0 points 0 points  What month? 0 points 0 points  What time? 0 points 0 points  Count back from 20 0 points 0 points  Months in reverse 0 points 4 points  Repeat phrase 2 points 4 points  Total Score 2 8    Immunization  History  Administered Date(s) Administered  . Pneumococcal Polysaccharide-23 06/24/2016  . Tdap 03/04/2013, 06/03/2017    Qualifies for Shingles Vaccine? Yes  Zostavax completed n/a. Due for Shingrix. Education has been provided regarding the importance of this vaccine. Pt has been advised to call insurance company to determine out of pocket expense. Advised may also receive vaccine at local pharmacy or Health Dept. Verbalized acceptance and understanding.   Tdap: up to date   Flu Vaccine: due 11/2019  Pneumococcal Vaccine: up to date   Covid-19 Vaccine: declined  Screening Tests Health Maintenance  Topic Date Due  . COVID-19 Vaccine (1) Never done  . FOOT EXAM  12/31/2018  . OPHTHALMOLOGY EXAM  02/18/2019  . INFLUENZA VACCINE  10/03/2019  . HEMOGLOBIN A1C  10/25/2019  . COLONOSCOPY  01/19/2021  . TETANUS/TDAP  06/04/2027  .  PNEUMOCOCCAL POLYSACCHARIDE VACCINE AGE 32-64 HIGH RISK  Completed  . Hepatitis C Screening  Completed  . HIV Screening  Completed   Cancer Screenings:  Colorectal Screening: Completed 01/19/2018. Repeat every 3 years  Lung Cancer Screening: (Low Dose CT Chest recommended if Age 17-80 years, 30 pack-year currently smoking OR have quit w/in 15years.) does qualify.    Scheduled   Additional Screening:  Hepatitis C Screening: does qualify; Completed 2018  Dental Screening: Recommended annual dental exams for proper oral hygiene  Community Resource Referral:  CRR required this visit?  Yes   Has a bath tub at home and is needing a step in shower       Plan:  I have personally reviewed and addressed the Medicare Annual Wellness questionnaire and have noted the following in the patient's chart:  A. Medical and social history B. Use of alcohol, tobacco or illicit drugs  C. Current medications and supplements D. Functional ability and status E.  Nutritional status F.  Physical activity G. Advance directives H. List of other physicians I.  Hospitalizations, surgeries, and ER visits in previous 12 months J.  [place] such as hearing and vision if needed, cognitive and depression L. Referrals and appointments   In addition, I have reviewed and discussed with patient certain preventive protocols, quality metrics, and best practice recommendations. A written personalized care plan for preventive services as well as general preventive health recommendations were provided to patient.   Signed,   Bevelyn Ngo, LPN  03/13/5091 Nurse Health Advisor  Nurse Notes: none

## 2019-08-13 ENCOUNTER — Ambulatory Visit
Admission: RE | Admit: 2019-08-13 | Discharge: 2019-08-13 | Disposition: A | Discharge status: Home / Self Care | Payer: Medicare Other | Source: Ambulatory Visit | Attending: Oncology | Admitting: Oncology

## 2019-08-13 ENCOUNTER — Other Ambulatory Visit: Payer: Self-pay

## 2019-08-13 DIAGNOSIS — R911 Solitary pulmonary nodule: Secondary | ICD-10-CM | POA: Insufficient documentation

## 2019-08-13 DIAGNOSIS — C3492 Malignant neoplasm of unspecified part of left bronchus or lung: Secondary | ICD-10-CM | POA: Diagnosis not present

## 2019-08-13 DIAGNOSIS — C3490 Malignant neoplasm of unspecified part of unspecified bronchus or lung: Secondary | ICD-10-CM | POA: Diagnosis not present

## 2019-08-14 NOTE — Progress Notes (Signed)
[facility]  [phone] [phone]  ID: Kristopher [name] OB: March 26, 1960  MR#: [id number]  AOZ#:[id number]  Patient Care Team: [name], DO as PCP - General (Family Medicine) Gavin Pound, CMA (Inactive) as Certified Medical Assistant [facility], [facility] as Pharmacist (Pharmacist) Telford Nab, RN as Registered Nurse Hall Busing, Nobie Putnam, RN as Case Manager (General Practice) [name], MD as Referring Physician (Pain Medicine) [name], MD as Consulting Physician (Oncology)  I connected with Kristopher [name] on 08/17/19 at  2:45 PM EDT by video enabled telemedicine visit and verified that I am speaking with the correct person using two identifiers.   I discussed the limitations, risks, security and privacy concerns of performing an evaluation and management service by telemedicine and the availability of in-person appointments. I also discussed with the patient that there may be a patient responsible charge related to this service. The patient expressed understanding and agreed to proceed.   Other persons participating in the visit and their role in the encounter: Patient, MD.  Patient's location: Home. Provider's location: Clinic.  CHIEF COMPLAINT: Pathologic stage IA2 squamous cell 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  INTERVAL HISTORY: Patient agreed to video assisted telemedicine visit for further evaluation and discussion of his imaging results.  He currently feels well and is asymptomatic.  He does not complain of any weakness or fatigue today.  He has a good appetite and denies weight loss. He has no neurologic complaints.  He denies any recent fevers. He has no chest pain, shortness of breath, cough, or hemoptysis.  He denies any nausea, vomiting, constipation, or diarrhea.  He has no urinary complaints.  Patient offers no specific complaints today.  REVIEW OF SYSTEMS:   Review of Systems    Constitutional: Negative.  Negative for fever, malaise/fatigue and weight loss.  Respiratory: Negative.  Negative for cough and shortness of breath.   Cardiovascular: Negative.  Negative for chest pain and leg swelling.  Gastrointestinal: Negative.  Negative for abdominal pain.  Genitourinary: Negative.  Negative for dysuria and flank pain.  Musculoskeletal: Negative for back pain.  Skin: Negative.  Negative for rash.  Neurological: Negative.  Negative for dizziness, focal weakness, weakness and headaches.  Psychiatric/Behavioral: Negative.  The patient is not nervous/anxious.     As per HPI. Otherwise, a complete review of systems is negative.  PAST MEDICAL HISTORY: Past Medical History:  Diagnosis Date  . Allergy   . Arthritis    left foot  . Benign hypertensive kidney disease   . Chronic back pain    Four rods in back  . Diabetes mellitus, type 2 ([place])   . Dyspnea   . GERD (gastroesophageal reflux disease)   . Hypertension   . Malignant neoplasm of lung ([place])   . Migraines    daily    PAST SURGICAL HISTORY: Past Surgical History:  Procedure Laterality Date  . APPENDECTOMY    . BACK SURGERY    . COLONOSCOPY WITH PROPOFOL N/A 02/19/2016   Procedure: COLONOSCOPY WITH PROPOFOL;  Surgeon: Lucilla Lame, MD;  Location: [facility];  Service: Endoscopy;  Laterality: N/A;  . COLONOSCOPY WITH PROPOFOL N/A 01/19/2018   Procedure: COLONOSCOPY WITH PROPOFOL;  Surgeon: Lucilla Lame, MD;  Location: [facility];  Service: Endoscopy;  Laterality: N/A;  Diabetic - oral meds  . DG OPERATIVE LEFT HIP ([place] [facility])     10/19  . ELECTROMAGNETIC NAVIGATION BROCHOSCOPY Left 11/18/2018   Procedure: ELECTROMAGNETIC NAVIGATION BRONCHOSCOPY;  Surgeon: Tyler Pita, MD;  Location: [facility];  Service: Cardiopulmonary;  Laterality: Left;  . FLEXIBLE BRONCHOSCOPY Bilateral 01/20/2019   Procedure: FLEXIBLE BRONCHOSCOPY;  Surgeon: Ottie Glazier, MD;  Location: [facility];  Service:  Thoracic;  Laterality: Bilateral;  . FLEXIBLE BRONCHOSCOPY Bilateral 01/22/2019   Procedure: FLEXIBLE BRONCHOSCOPY;  Surgeon: Ottie Glazier, MD;  Location: [facility];  Service: Thoracic;  Laterality: Bilateral;  . FOOT SURGERY Left    Screws and plates  . JOINT REPLACEMENT Left 12/2017   DR Rudene Christians Hip  . KNEE SURGERY Left    X 2  . LEG SURGERY    . LUNG CANCER SURGERY    . POLYPECTOMY N/A 02/19/2016   Procedure: POLYPECTOMY;  Surgeon: Lucilla Lame, MD;  Location: [facility];  Service: Endoscopy;  Laterality: N/A;  . POLYPECTOMY  01/19/2018   Procedure: POLYPECTOMY;  Surgeon: Lucilla Lame, MD;  Location: [facility];  Service: Endoscopy;;  . THORACOTOMY Left 01/14/2019   Procedure: THORACOTOMY MAJOR, LEFT;  Surgeon: Nestor Lewandowsky, MD;  Location: [facility];  Service: General;  Laterality: Left;  . TOTAL HIP ARTHROPLASTY Left 12/02/2017   Procedure: TOTAL HIP ARTHROPLASTY ANTERIOR APPROACH;  Surgeon: Hessie Knows, MD;  Location: [facility];  Service: Orthopedics;  Laterality: Left;  [name] VIDEO BRONCHOSCOPY Left 01/14/2019   Procedure: VIDEO BRONCHOSCOPY WITH FLUORO, LEFT;  Surgeon: Nestor Lewandowsky, MD;  Location: [facility];  Service: General;  Laterality: Left;    FAMILY HISTORY: Family History  Problem Relation Age of Onset  . Cancer Father   . Diabetes Sister   . Thrombosis Sister     ADVANCED DIRECTIVES (Y/N):  N  HEALTH MAINTENANCE: Social History   Tobacco Use  . Smoking status: Current Some Day Smoker    Packs/day: 0.05    Years: 35.00    Pack years: 1.75    Types: Cigarettes  . Smokeless tobacco: Never Used  . Tobacco comment: 1 pack last 3 days   Vaping Use  . Vaping Use: Never used  Substance Use Topics  . Alcohol use: No    Alcohol/week: 0.0 standard drinks  . Drug use: Yes    Types: Oxycodone    Comment: prescribed     Colonoscopy:  PAP:  Bone density:  Lipid panel:  Allergies  Allergen Reactions  . Acetaminophen Swelling  . Aspirin  Anaphylaxis  . Epinephrine Anaphylaxis    Does not include albuterol  . Novocain [Procaine] Anaphylaxis  . Penicillins Anaphylaxis    Has patient had a PCN reaction causing immediate rash, facial/tongue/throat swelling, SOB or lightheadedness with hypotension: Yes Has patient had a PCN reaction causing severe rash involving mucus membranes or skin necrosis: No Has patient had a PCN reaction that required hospitalization: Yes Has patient had a PCN reaction occurring within the last 10 years: No If all of the above answers are "NO", then may proceed with Cephalosporin use.   . Strawberry Extract Anaphylaxis  . Shellfish Allergy Hives and Nausea And Vomiting    Current Outpatient Medications  Medication Sig Dispense Refill  . Accu-Chek FastClix Lancets MISC USE TO TEST BLOOD SUGAR 2X A DAY 100 each 12  . ACCU-CHEK GUIDE test strip USE TO TEST BLOOD SUGAR 2X A DAY 100 strip 12  . amLODipine (NORVASC) 5 MG tablet Take 1 tablet (5 mg total) by mouth 2 (two) times daily. 180 tablet 1  . ARNUITY ELLIPTA 100 MCG/ACT AEPB Inhale 1 puff into the lungs daily.    [name] atorvastatin (LIPITOR) 10 MG tablet  Take 1 tablet (10 mg total) by mouth daily at 6 PM. 90 tablet 1  . hydrALAZINE (APRESOLINE) 100 MG tablet TAKE 1 TABLET BY MOUTH TWICE A DAY 180 tablet 0  . lidocaine (LIDODERM) 5 % Place 1 patch onto the skin daily. Remove & Discard patch within 12 hours or as directed by MD 30 patch 12  . metFORMIN (GLUCOPHAGE) 500 MG tablet Take 1 tablet (500 mg total) by mouth 2 (two) times daily with a meal. (Patient taking differently: Take 1,000 mg by mouth 2 (two) times daily with a meal. ) 180 tablet 1  . metoprolol succinate (TOPROL-XL) 100 MG 24 hr tablet Take 2 tablets (200 mg total) by mouth in the morning and at bedtime. Take with or immediately following a meal. 360 tablet 1  . montelukast (SINGULAIR) 10 MG tablet Take 10 mg by mouth daily.    . NONFORMULARY OR COMPOUNDED ITEM Apply 1-2 mLs topically 4  (four) times daily as needed. 10% Ketamine/2% Cyclobenzaprine/6% Gabapentin Cream 240 each 6  . nortriptyline (PAMELOR) 25 MG capsule Take 2 capsules (50 mg total) by mouth at bedtime. 180 capsule 1  . Oxycodone HCl 10 MG TABS Take 1 tablet (10 mg total) by mouth every 6 (six) hours as needed. Must last 30 days 120 tablet 0  . pantoprazole (PROTONIX) 40 MG tablet Take 1 tablet (40 mg total) by mouth daily. 15 tablet 2  . Potassium Chloride ER 20 MEQ TBCR TAKE 1 TABLET BY MOUTH EVERY DAY 90 tablet 1  . SUMAtriptan (IMITREX) 50 MG tablet TAKE 1 TAB AT ONSET OF MIGRAINE. MAY REPEAT IN 2 HOURS IF HEADACHE PERSISTS OR RECURS. 10 tablet 0  . [START ON 08/25/2019] Oxycodone HCl 10 MG TABS Take 1 tablet (10 mg total) by mouth every 6 (six) hours as needed. Must last 30 days (Patient not taking: Reported on 08/11/2019) 120 tablet 0  . [START ON 09/24/2019] Oxycodone HCl 10 MG TABS Take 1 tablet (10 mg total) by mouth every 6 (six) hours as needed. Must last 30 days (Patient not taking: Reported on 08/11/2019) 120 tablet 0   No current facility-administered medications for this visit.   Facility-Administered Medications Ordered in Other Visits  Medication Dose Route Frequency Provider Last Rate Last Admin  . 0.9 %  sodium chloride infusion    Continuous PRN Dionne Bucy, CRNA   New Bag at 01/22/19 [id number]    OBJECTIVE: There were no vitals filed for this visit.   There is no height or weight on file to calculate BMI.    ECOG FS:0 - Asymptomatic  General: Well-developed, well-nourished, no acute distress. HEENT: Normocephalic. Neuro: Alert, answering all questions appropriately. Cranial nerves grossly intact. Psych: Normal affect.  LAB RESULTS:  Lab Results  Component Value Date   NA 141 04/27/2019   K 3.7 04/27/2019   CL 100 04/27/2019   CO2 27 04/27/2019   GLUCOSE 208 (H) 04/27/2019   BUN 11 04/27/2019   CREATININE 0.74 (L) 04/27/2019   CALCIUM 9.3 04/27/2019   PROT 7.3 04/27/2019   ALBUMIN 4.5  04/27/2019   AST 43 (H) 04/27/2019   ALT 69 (H) 04/27/2019   ALKPHOS 129 (H) 04/27/2019   BILITOT 0.3 04/27/2019   GFRNONAA 102 04/27/2019   GFRAA 118 04/27/2019    Lab Results  Component Value Date   WBC 8.5 04/27/2019   NEUTROABS 4.3 04/27/2019   HGB 13.3 04/27/2019   HCT 39.9 04/27/2019   MCV 90 04/27/2019   PLT 211  04/27/2019     STUDIES: CT Chest Wo Contrast  Result Date: 08/14/2019 CLINICAL DATA:  Squamous cell lung cancer, status post left lower lobectomy EXAM: CT CHEST WITHOUT CONTRAST TECHNIQUE: Multidetector CT imaging of the chest was performed following the standard protocol without IV contrast. COMPARISON:  CT chest dated 11/17/2018 FINDINGS: Cardiovascular: The heart is normal in size. No pericardial effusion. No evidence of thoracic aortic aneurysm. Mild atherosclerotic calcifications of the aortic arch. Mediastinum/Nodes: Small right paratracheal nodes which do not meet pathologic CT size criteria. However, there is a 12 mm short axis AP window node (series 2/image 81) which is new. 14 mm short axis subcarinal node (series 2/image 61), previously 5 mm. Visualized thyroid is unremarkable. Lungs/Pleura: Status post left lower lobectomy. 3 mm right upper lobe nodule (series 3/image 34), unchanged. 5 mm subpleural nodule in the posterior right upper lobe (series 3/image 36), slightly more conspicuous. 3 mm ground-glass nodule in the posterior left upper lobe (series 3/image 33), possibly new. Mild centrilobular emphysematous changes, upper lung predominant. No focal consolidation. No pleural effusion or pneumothorax. Upper Abdomen: Visualized upper abdomen is notable for hepatic steatosis. Musculoskeletal: Mild degenerative changes of the lower thoracic spine. IMPRESSION: Status post left lower lobectomy. New/progressive mediastinal nodes measuring up to 14 mm short axis in the subcarinal region. This appearance is worrisome for nodal recurrence. Consider PET-CT for further  evaluation. Small bilateral pulmonary nodules measuring up to 5 mm, likely benign, but warranting attention on follow-up. Aortic Atherosclerosis (ICD10-I70.0) and Emphysema (ICD10-J43.9). Electronically Signed   By: Julian Hy M.D.   On: 08/14/2019 01:37    ASSESSMENT: Pathologic stage IA2 squamous cell 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  PLAN:    1. 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  He had a complicated postoperative course, but is now fully recovered.  CT scan results from August 14, 2019 reviewed independently and report as above with a suspicious subcarinal lymph node measuring 1.4 cm.  Will get a PET scan in the next 1 to 2 weeks for further evaluation.  Patient will then have a video assisted telemedicine visit 1 to 2 days after his imaging to discuss the results and any additional diagnostic planning necessary.    I provided 20 minutes of face-to-face video visit time during this encounter which included chart review, counseling, and coordination of care as documented above.   Patient expressed understanding and was in agreement with this plan. He also understands that He can call clinic at any time with any questions, concerns, or complaints.   Cancer Staging Squamous cell carcinoma of left lung [facility]) Staging form: Lung, AJCC 8th Edition - Clinical stage from 02/12/2019: Stage IA2 (cT1b, cN0, cM0) - Signed by [name], MD on 02/12/2019   [name], MD   08/17/2019 2:38 PM

## 2019-08-16 ENCOUNTER — Encounter: Payer: Self-pay | Admitting: Oncology

## 2019-08-16 ENCOUNTER — Inpatient Hospital Stay: Payer: Medicare Other | Attending: Oncology | Admitting: Oncology

## 2019-08-16 ENCOUNTER — Other Ambulatory Visit: Payer: Self-pay

## 2019-08-16 DIAGNOSIS — C3492 Malignant neoplasm of unspecified part of left bronchus or lung: Secondary | ICD-10-CM | POA: Diagnosis not present

## 2019-08-16 NOTE — Progress Notes (Signed)
Patient prescreened for appointment. Patient has no concerns or questions.  

## 2019-08-19 NOTE — Progress Notes (Signed)
[facility]  [phone] [phone]  ID: Kristopher [name] OB: 1960/09/18  MR#: [id number]  OMV#:[id number]  Patient Care Team: [name], DO as PCP - General (Family Medicine) Gavin Pound, CMA (Inactive) as Certified Medical Assistant [place], [facility] as Pharmacist (Pharmacist) Telford Nab, RN as Registered Nurse Hall Busing, Nobie Putnam, RN as Case Manager (General Practice) [name], MD as Referring Physician (Pain Medicine) [name], MD as Consulting Physician (Oncology)  I connected with Kristopher [name] on 08/25/19 at 10:15 AM EDT by video enabled telemedicine visit and verified that I am speaking with the correct person using two identifiers.   I discussed the limitations, risks, security and privacy concerns of performing an evaluation and management service by telemedicine and the availability of in-person appointments. I also discussed with the patient that there may be a patient responsible charge related to this service. The patient expressed understanding and agreed to proceed.   Other persons participating in the visit and their role in the encounter: Patient, MD.  Patient's location: Home. Provider's location: Clinic.  CHIEF COMPLAINT: Pathologic stage IA2 squamous cell 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  INTERVAL HISTORY: Patient agreed to video assisted telemedicine visit for further evaluation and discussion of his PET scan results.  He is anxious, but otherwise feels well.  He does not complain of any weakness or fatigue today.  He has a good appetite and denies weight loss. He has no neurologic complaints.  He denies any recent fevers or illnesses.  He has no chest pain, shortness of breath, cough, or hemoptysis.  He denies any nausea, vomiting, constipation, or diarrhea.  He has no urinary complaints.  Patient offers no specific complaints today.  REVIEW OF SYSTEMS:   Review of Systems    Constitutional: Negative.  Negative for fever, malaise/fatigue and weight loss.  Respiratory: Negative.  Negative for cough and shortness of breath.   Cardiovascular: Negative.  Negative for chest pain and leg swelling.  Gastrointestinal: Negative.  Negative for abdominal pain.  Genitourinary: Negative.  Negative for dysuria and flank pain.  Musculoskeletal: Negative for back pain.  Skin: Negative.  Negative for rash.  Neurological: Negative.  Negative for dizziness, focal weakness, weakness and headaches.  Psychiatric/Behavioral: The patient is nervous/anxious.     As per HPI. Otherwise, a complete review of systems is negative.  PAST MEDICAL HISTORY: Past Medical History:  Diagnosis Date  . Allergy   . Arthritis    left foot  . Benign hypertensive kidney disease   . Chronic back pain    Four rods in back  . Diabetes mellitus, type 2 ([place])   . Dyspnea   . GERD (gastroesophageal reflux disease)   . Hypertension   . Malignant neoplasm of lung ([place])   . Migraines    daily    PAST SURGICAL HISTORY: Past Surgical History:  Procedure Laterality Date  . APPENDECTOMY    . BACK SURGERY    . COLONOSCOPY WITH PROPOFOL N/A 02/19/2016   Procedure: COLONOSCOPY WITH PROPOFOL;  Surgeon: Lucilla Lame, MD;  Location: [place];  Service: Endoscopy;  Laterality: N/A;  . COLONOSCOPY WITH PROPOFOL N/A 01/19/2018   Procedure: COLONOSCOPY WITH PROPOFOL;  Surgeon: Lucilla Lame, MD;  Location: [place];  Service: Endoscopy;  Laterality: N/A;  Diabetic - oral meds  . DG OPERATIVE LEFT HIP ([place] HX)     10/19  . ELECTROMAGNETIC NAVIGATION BROCHOSCOPY Left 11/18/2018   Procedure: ELECTROMAGNETIC NAVIGATION BRONCHOSCOPY;  Surgeon: Tyler Pita, MD;  Location: [facility];  Service: Cardiopulmonary;  Laterality: Left;  . FLEXIBLE BRONCHOSCOPY Bilateral 01/20/2019   Procedure: FLEXIBLE BRONCHOSCOPY;  Surgeon: Ottie Glazier, MD;  Location: [facility];  Service: Thoracic;   Laterality: Bilateral;  . FLEXIBLE BRONCHOSCOPY Bilateral 01/22/2019   Procedure: FLEXIBLE BRONCHOSCOPY;  Surgeon: Ottie Glazier, MD;  Location: [facility];  Service: Thoracic;  Laterality: Bilateral;  . FOOT SURGERY Left    Screws and plates  . JOINT REPLACEMENT Left 12/2017   DR Rudene Christians Hip  . KNEE SURGERY Left    X 2  . LEG SURGERY    . LUNG CANCER SURGERY    . POLYPECTOMY N/A 02/19/2016   Procedure: POLYPECTOMY;  Surgeon: Lucilla Lame, MD;  Location: [place];  Service: Endoscopy;  Laterality: N/A;  . POLYPECTOMY  01/19/2018   Procedure: POLYPECTOMY;  Surgeon: Lucilla Lame, MD;  Location: [place];  Service: Endoscopy;;  . THORACOTOMY Left 01/14/2019   Procedure: THORACOTOMY MAJOR, LEFT;  Surgeon: Nestor Lewandowsky, MD;  Location: [facility];  Service: General;  Laterality: Left;  . TOTAL HIP ARTHROPLASTY Left 12/02/2017   Procedure: TOTAL HIP ARTHROPLASTY ANTERIOR APPROACH;  Surgeon: Hessie Knows, MD;  Location: [facility];  Service: Orthopedics;  Laterality: Left;  [name] VIDEO BRONCHOSCOPY Left 01/14/2019   Procedure: VIDEO BRONCHOSCOPY WITH FLUORO, LEFT;  Surgeon: Nestor Lewandowsky, MD;  Location: [facility];  Service: General;  Laterality: Left;    FAMILY HISTORY: Family History  Problem Relation Age of Onset  . Cancer Father   . Diabetes Sister   . Thrombosis Sister     ADVANCED DIRECTIVES (Y/N):  N  HEALTH MAINTENANCE: Social History   Tobacco Use  . Smoking status: Current Some Day Smoker    Packs/day: 0.05    Years: 35.00    Pack years: 1.75    Types: Cigarettes  . Smokeless tobacco: Never Used  . Tobacco comment: 1 pack last 3 days   Vaping Use  . Vaping Use: Never used  Substance Use Topics  . Alcohol use: No    Alcohol/week: 0.0 standard drinks  . Drug use: Yes    Types: Oxycodone    Comment: prescribed     Colonoscopy:  PAP:  Bone density:  Lipid panel:  Allergies  Allergen Reactions  . Acetaminophen Swelling  . Aspirin Anaphylaxis  .  Epinephrine Anaphylaxis    Does not include albuterol  . Novocain [Procaine] Anaphylaxis  . Penicillins Anaphylaxis    Has patient had a PCN reaction causing immediate rash, facial/tongue/throat swelling, SOB or lightheadedness with hypotension: Yes Has patient had a PCN reaction causing severe rash involving mucus membranes or skin necrosis: No Has patient had a PCN reaction that required hospitalization: Yes Has patient had a PCN reaction occurring within the last 10 years: No If all of the above answers are "NO", then may proceed with Cephalosporin use.   . Strawberry Extract Anaphylaxis  . Shellfish Allergy Hives and Nausea And Vomiting    Current Outpatient Medications  Medication Sig Dispense Refill  . Accu-Chek FastClix Lancets MISC USE TO TEST BLOOD SUGAR 2X A DAY 100 each 12  . ACCU-CHEK GUIDE test strip USE TO TEST BLOOD SUGAR 2X A DAY 100 strip 12  . amLODipine (NORVASC) 5 MG tablet Take 1 tablet (5 mg total) by mouth 2 (two) times daily. 180 tablet 1  . ARNUITY ELLIPTA 100 MCG/ACT AEPB Inhale 1 puff into the lungs daily.    [name] atorvastatin (LIPITOR) 10 MG tablet  Take 1 tablet (10 mg total) by mouth daily at 6 PM. 90 tablet 1  . hydrALAZINE (APRESOLINE) 100 MG tablet TAKE 1 TABLET BY MOUTH TWICE A DAY 180 tablet 0  . lidocaine (LIDODERM) 5 % Place 1 patch onto the skin daily. Remove & Discard patch within 12 hours or as directed by MD 30 patch 12  . metFORMIN (GLUCOPHAGE) 500 MG tablet Take 1 tablet (500 mg total) by mouth 2 (two) times daily with a meal. (Patient taking differently: Take 1,000 mg by mouth 2 (two) times daily with a meal. ) 180 tablet 1  . metoprolol succinate (TOPROL-XL) 100 MG 24 hr tablet Take 2 tablets (200 mg total) by mouth in the morning and at bedtime. Take with or immediately following a meal. 360 tablet 1  . montelukast (SINGULAIR) 10 MG tablet Take 10 mg by mouth daily.    . NONFORMULARY OR COMPOUNDED ITEM Apply 1-2 mLs topically 4 (four) times daily as  needed. 10% Ketamine/2% Cyclobenzaprine/6% Gabapentin Cream 240 each 6  . nortriptyline (PAMELOR) 25 MG capsule Take 2 capsules (50 mg total) by mouth at bedtime. 180 capsule 1  . Oxycodone HCl 10 MG TABS Take 1 tablet (10 mg total) by mouth every 6 (six) hours as needed. Must last 30 days 120 tablet 0  . Oxycodone HCl 10 MG TABS Take 1 tablet (10 mg total) by mouth every 6 (six) hours as needed. Must last 30 days 120 tablet 0  . [START ON 09/24/2019] Oxycodone HCl 10 MG TABS Take 1 tablet (10 mg total) by mouth every 6 (six) hours as needed. Must last 30 days 120 tablet 0  . pantoprazole (PROTONIX) 40 MG tablet Take 1 tablet (40 mg total) by mouth daily. 15 tablet 2  . Potassium Chloride ER 20 MEQ TBCR TAKE 1 TABLET BY MOUTH EVERY DAY 90 tablet 1  . SUMAtriptan (IMITREX) 50 MG tablet TAKE 1 TAB AT ONSET OF MIGRAINE. MAY REPEAT IN 2 HOURS IF HEADACHE PERSISTS OR RECURS. 10 tablet 0   No current facility-administered medications for this visit.   Facility-Administered Medications Ordered in Other Visits  Medication Dose Route Frequency Provider Last Rate Last Admin  . 0.9 %  sodium chloride infusion    Continuous PRN Dionne Bucy, CRNA   New Bag at 01/22/19 2563    OBJECTIVE: There were no vitals filed for this visit.   There is no height or weight on file to calculate BMI.    ECOG FS:0 - Asymptomatic  General: Well-developed, well-nourished, no acute distress. HEENT: Normocephalic. Neuro: Alert, answering all questions appropriately. Cranial nerves grossly intact. Psych: Normal affect.   LAB RESULTS:  Lab Results  Component Value Date   NA 141 04/27/2019   K 3.7 04/27/2019   CL 100 04/27/2019   CO2 27 04/27/2019   GLUCOSE 208 (H) 04/27/2019   BUN 11 04/27/2019   CREATININE 0.74 (L) 04/27/2019   CALCIUM 9.3 04/27/2019   PROT 7.3 04/27/2019   ALBUMIN 4.5 04/27/2019   AST 43 (H) 04/27/2019   ALT 69 (H) 04/27/2019   ALKPHOS 129 (H) 04/27/2019   BILITOT 0.3 04/27/2019    GFRNONAA 102 04/27/2019   GFRAA 118 04/27/2019    Lab Results  Component Value Date   WBC 8.5 04/27/2019   NEUTROABS 4.3 04/27/2019   HGB 13.3 04/27/2019   HCT 39.9 04/27/2019   MCV 90 04/27/2019   PLT 211 04/27/2019     STUDIES: CT Chest Wo Contrast  Result Date: 08/14/2019  CLINICAL DATA:  Squamous cell lung cancer, status post left lower lobectomy EXAM: CT CHEST WITHOUT CONTRAST TECHNIQUE: Multidetector CT imaging of the chest was performed following the standard protocol without IV contrast. COMPARISON:  CT chest dated 11/17/2018 FINDINGS: Cardiovascular: The heart is normal in size. No pericardial effusion. No evidence of thoracic aortic aneurysm. Mild atherosclerotic calcifications of the aortic arch. Mediastinum/Nodes: Small right paratracheal nodes which do not meet pathologic CT size criteria. However, there is a 12 mm short axis AP window node (series 2/image 81) which is new. 14 mm short axis subcarinal node (series 2/image 61), previously 5 mm. Visualized thyroid is unremarkable. Lungs/Pleura: Status post left lower lobectomy. 3 mm right upper lobe nodule (series 3/image 34), unchanged. 5 mm subpleural nodule in the posterior right upper lobe (series 3/image 36), slightly more conspicuous. 3 mm ground-glass nodule in the posterior left upper lobe (series 3/image 33), possibly new. Mild centrilobular emphysematous changes, upper lung predominant. No focal consolidation. No pleural effusion or pneumothorax. Upper Abdomen: Visualized upper abdomen is notable for hepatic steatosis. Musculoskeletal: Mild degenerative changes of the lower thoracic spine. IMPRESSION: Status post left lower lobectomy. New/progressive mediastinal nodes measuring up to 14 mm short axis in the subcarinal region. This appearance is worrisome for nodal recurrence. Consider PET-CT for further evaluation. Small bilateral pulmonary nodules measuring up to 5 mm, likely benign, but warranting attention on follow-up.  Aortic Atherosclerosis (ICD10-I70.0) and Emphysema (ICD10-J43.9). Electronically Signed   By: Julian Hy M.D.   On: 08/14/2019 01:37   NM PET Image Restag (PS) Skull Base To Thigh  Result Date: 08/23/2019 CLINICAL DATA:  Subsequent treatment strategy for lung cancer. EXAM: NUCLEAR MEDICINE PET SKULL BASE TO THIGH TECHNIQUE: 12.7 mCi F-18 FDG was injected intravenously. Full-ring PET imaging was performed from the skull base to thigh after the radiotracer. CT data was obtained and used for attenuation correction and anatomic localization. Fasting blood glucose: 238 mg/dl COMPARISON:  11/04/2018 FINDINGS: Mediastinal blood pool activity: SUV max 2.03 Liver activity: SUV max NA NECK: No hypermetabolic lymph nodes in the neck. Incidental CT findings: none CHEST: Hypermetabolic mediastinal nodal enlargement (Image 91, series 3) 14 mm lymph node (SUVmax = 5.1) adjacent slightly smaller lymph node with similar FDG uptake. AP window lymph node (image 85, series 3) 12 mm (SUVmax = 3.5) No additional hypermetabolic lymph nodes in the chest. Parenchymal assessment limited by respiratory motion. No suspicious nodules within the limitations of the exam Tiny nodule along the superior aspect of the major fissure in the RIGHT chest (image 78, series 3) stable at approximately 4 mm. Postoperative findings of LEFT lower lobectomy. Incidental CT findings: Atheromatous plaque in the thoracic aorta. Heart size normal without pericardial effusion. Postoperative changes in the chest as discussed above. ABDOMEN/PELVIS: No abnormal hypermetabolic activity within the liver, pancreas, adrenal glands, or spleen. No hypermetabolic lymph nodes in the abdomen or pelvis. Incidental CT findings: Marked hepatic steatosis. Focal fatty sparing about the gallbladder fossa. No acute intra-abdominal process. Post appendectomy SKELETON: No focal hypermetabolic activity to suggest skeletal metastasis. Incidental CT findings: Post lumbar spinal  fusion and LEFT hip arthroplasty. IMPRESSION: 1. Mediastinal nodal enlargement, mild but with increased metabolic activity suspicious for nodal metastases. 2. Small pulmonary nodules better characterized on recent chest CT, small nodule along the superior aspect of the major fissure in the RIGHT chest is unchanged. Electronically Signed   By: Zetta Bills M.D.   On: 08/23/2019 13:55    ASSESSMENT: Pathologic stage IA2 squamous cell carcinoma of the  left lower lobe lung.  PLAN:    1. 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  He had a complicated postoperative course, but is now fully recovered.  CT scan results from August 14, 2019 reviewed independently with a suspicious subcarinal lymph node measuring 1.4 cm.  Follow-up PET scan on August 23, 2019 revealed hypermetabolism in lymph node highly suspicious for recurrence.  Will discuss patient tumor board later this week to determine if biopsy is necessary.  If treatment is needed, patient will likely benefit from concurrent chemotherapy and XRT.    I provided 20 minutes of face-to-face video visit time during this encounter which included chart review, counseling, and coordination of care as documented above.   Patient expressed understanding and was in agreement with this plan. He also understands that He can call clinic at any time with any questions, concerns, or complaints.   Cancer Staging Squamous cell carcinoma of left lung [facility]) Staging form: Lung, AJCC 8th Edition - Clinical stage from 02/12/2019: Stage IA2 (cT1b, cN0, cM0) - Signed by [name], MD on 02/12/2019   [name], MD   08/25/2019 6:40 AM

## 2019-08-23 ENCOUNTER — Ambulatory Visit
Admission: RE | Admit: 2019-08-23 | Discharge: 2019-08-23 | Disposition: A | Discharge status: Home / Self Care | Payer: Medicare Other | Source: Ambulatory Visit | Attending: Oncology | Admitting: Oncology

## 2019-08-23 ENCOUNTER — Other Ambulatory Visit: Payer: Self-pay

## 2019-08-23 ENCOUNTER — Other Ambulatory Visit: Payer: Self-pay | Admitting: Oncology

## 2019-08-23 DIAGNOSIS — R590 Localized enlarged lymph nodes: Secondary | ICD-10-CM | POA: Diagnosis not present

## 2019-08-23 DIAGNOSIS — C3492 Malignant neoplasm of unspecified part of left bronchus or lung: Secondary | ICD-10-CM | POA: Diagnosis not present

## 2019-08-23 DIAGNOSIS — C3490 Malignant neoplasm of unspecified part of unspecified bronchus or lung: Secondary | ICD-10-CM | POA: Diagnosis not present

## 2019-08-23 LAB — GLUCOSE, CAPILLARY: Glucose-Capillary: 238 mg/dL — ABNORMAL HIGH (ref 70–99)

## 2019-08-23 MED ORDER — FLUDEOXYGLUCOSE F - 18 (FDG) INJECTION
12.7000 | Freq: Once | INTRAVENOUS | Status: AC | PRN
  Administered 2019-08-23: 12.7 via INTRAVENOUS

## 2019-08-24 ENCOUNTER — Encounter: Payer: Self-pay | Admitting: Oncology

## 2019-08-24 ENCOUNTER — Inpatient Hospital Stay (HOSPITAL_BASED_OUTPATIENT_CLINIC_OR_DEPARTMENT_OTHER): Payer: Medicare Other | Admitting: Oncology

## 2019-08-24 DIAGNOSIS — C3492 Malignant neoplasm of unspecified part of left bronchus or lung: Secondary | ICD-10-CM

## 2019-08-24 NOTE — Progress Notes (Signed)
Patient scheduled for virtual visit today for PET results. Patient assessment completed over the phone. Patient denies any concerns today.

## 2019-08-25 ENCOUNTER — Ambulatory Visit (INDEPENDENT_AMBULATORY_CARE_PROVIDER_SITE_OTHER): Payer: Medicare Other | Admitting: Family Medicine

## 2019-08-25 ENCOUNTER — Encounter: Payer: Self-pay | Admitting: Family Medicine

## 2019-08-25 ENCOUNTER — Other Ambulatory Visit: Payer: Self-pay

## 2019-08-25 VITALS — BP 134/82 | HR 71 | Temp 97.8°F | Ht 68.11 in | Wt 237.6 lb

## 2019-08-25 DIAGNOSIS — C3492 Malignant neoplasm of unspecified part of left bronchus or lung: Secondary | ICD-10-CM

## 2019-08-25 DIAGNOSIS — N181 Chronic kidney disease, stage 1: Secondary | ICD-10-CM | POA: Diagnosis not present

## 2019-08-25 DIAGNOSIS — I700 Atherosclerosis of aorta: Secondary | ICD-10-CM

## 2019-08-25 DIAGNOSIS — J439 Emphysema, unspecified: Secondary | ICD-10-CM | POA: Diagnosis not present

## 2019-08-25 DIAGNOSIS — I129 Hypertensive chronic kidney disease with stage 1 through stage 4 chronic kidney disease, or unspecified chronic kidney disease: Secondary | ICD-10-CM

## 2019-08-25 DIAGNOSIS — F1120 Opioid dependence, uncomplicated: Secondary | ICD-10-CM

## 2019-08-25 DIAGNOSIS — E1122 Type 2 diabetes mellitus with diabetic chronic kidney disease: Secondary | ICD-10-CM | POA: Diagnosis not present

## 2019-08-25 LAB — BAYER DCA HB A1C WAIVED: HB A1C (BAYER DCA - WAIVED): 8.4 % — ABNORMAL HIGH (ref <7.0)

## 2019-08-25 MED ORDER — HYDRALAZINE HCL 100 MG PO TABS: 100.0000 mg | ORAL_TABLET | Freq: Two times a day (BID) | ORAL | 1 refills | Status: DC

## 2019-08-25 MED ORDER — TRULICITY 0.75 MG/0.5ML ~~LOC~~ SOAJ
0.7500 mg | SUBCUTANEOUS | 1 refills | Status: DC
Start: 2019-08-25 — End: 2019-09-22

## 2019-08-25 MED ORDER — METFORMIN HCL 500 MG PO TABS: 1000.0000 mg | ORAL_TABLET | Freq: Two times a day (BID) | ORAL | 1 refills | Status: DC

## 2019-08-25 MED ORDER — AMLODIPINE BESYLATE 5 MG PO TABS: 5.0000 mg | ORAL_TABLET | Freq: Two times a day (BID) | ORAL | 1 refills | Status: DC

## 2019-08-25 MED ORDER — ATORVASTATIN CALCIUM 10 MG PO TABS: 10.0000 mg | ORAL_TABLET | Freq: Every day | ORAL | 1 refills | Status: DC

## 2019-08-25 NOTE — Progress Notes (Signed)
BP 134/82 (BP Location: Left Arm, Patient Position: Sitting, Cuff Size: Normal)   Pulse 71   Temp 97.8 F (36.6 C) (Oral)   Ht 5' 8.11" (1.73 m)   Wt 237 lb 9.6 oz (107.8 kg)   SpO2 98%   BMI 36.01 kg/m    Subjective:    Patient ID: Kristopher [name], male    DOB: 1960/05/16, 59 y.o.   MRN: [id number]  HPI: [name] is a 59 y.o. male  Chief Complaint  Patient presents with  . Hypertension   Met with Dr. Grayland Ormond yesterday and his cancer has come back. He is about to start radiation. He's thinking about it, but he's not too concerned at this time. Taking it in stride.   HYPERTENSION Hypertension status: controlled  Satisfied with current treatment? yes Duration of hypertension: chronic BP monitoring frequency:  rarely BP range:  BP medication side effects:  no Medication compliance: excellent compliance Previous BP meds:amlodipine, metoprolol, hydralazine Aspirin: no Recurrent headaches: no Visual changes: no Palpitations: no Dyspnea: no Chest pain: no Lower extremity edema: no Dizzy/lightheaded: no  DIABETES Hypoglycemic episodes:no Polydipsia/polyuria: no Visual disturbance: no Chest pain: no Paresthesias: no Glucose Monitoring: no  Accucheck frequency: Not Checking Taking Insulin?: no Blood Pressure Monitoring: rarely Retinal Examination: Not up to Date Foot Exam: Done today Diabetic Education: Completed Pneumovax: Up to Date Influenza: Up to Date Aspirin: no  Relevant past medical, surgical, family and social history reviewed and updated as indicated. Interim medical history since our last visit reviewed. Allergies and medications reviewed and updated.  Review of Systems  Constitutional: Negative.   Respiratory: Negative.   Cardiovascular: Negative.   Gastrointestinal: Negative.   Neurological: Negative.   Psychiatric/Behavioral: Negative.     Per HPI unless specifically indicated above     Objective:    BP 134/82 (BP Location:  Left Arm, Patient Position: Sitting, Cuff Size: Normal)   Pulse 71   Temp 97.8 F (36.6 C) (Oral)   Ht 5' 8.11" (1.73 m)   Wt 237 lb 9.6 oz (107.8 kg)   SpO2 98%   BMI 36.01 kg/m   Wt Readings from Last 3 Encounters:  08/25/19 237 lb 9.6 oz (107.8 kg)  08/11/19 235 lb (106.6 kg)  03/19/19 224 lb (101.6 kg)    Physical Exam Vitals and nursing note reviewed.  Constitutional:      General: He is not in acute distress.    Appearance: Normal appearance. He is not ill-appearing, toxic-appearing or diaphoretic.  HENT:     Head: Normocephalic and atraumatic.     Right Ear: External ear normal.     Left Ear: External ear normal.     Nose: Nose normal.     Mouth/Throat:     Mouth: Mucous membranes are moist.     Pharynx: Oropharynx is clear.  Eyes:     General: No scleral icterus.       Right eye: No discharge.        Left eye: No discharge.     Extraocular Movements: Extraocular movements intact.     Conjunctiva/sclera: Conjunctivae normal.     Pupils: Pupils are equal, round, and reactive to light.  Cardiovascular:     Rate and Rhythm: Normal rate and regular rhythm.     Pulses: Normal pulses.     Heart sounds: Normal heart sounds. No murmur heard.  No friction rub. No gallop.   Pulmonary:     Effort: Pulmonary effort is normal. No respiratory distress.  Breath sounds: Normal breath sounds. No stridor. No wheezing, rhonchi or rales.  Chest:     Chest wall: No tenderness.  Musculoskeletal:        General: Normal range of motion.     Cervical back: Normal range of motion and neck supple.  Skin:    General: Skin is warm and dry.     Capillary Refill: Capillary refill takes less than 2 seconds.     Coloration: Skin is not jaundiced or pale.     Findings: No bruising, erythema, lesion or rash.  Neurological:     General: No focal deficit present.     Mental Status: He is alert and oriented to person, place, and time. Mental status is at baseline.  Psychiatric:         Mood and Affect: Mood normal.        Behavior: Behavior normal.        Thought Content: Thought content normal.        Judgment: Judgment normal.     Results for orders placed or performed in visit on 08/25/19  Bayer DCA Hb A1c Waived  Result Value Ref Range   HB A1C (BAYER DCA - WAIVED) 8.4 (H) <7.0 %      Assessment & Plan:   Problem List Items Addressed This Visit      Cardiovascular and Mediastinum   Aortic atherosclerosis (HCC)    Will keep BP and cholesterol and sugars under good control. Continue to monitor. Call with any concerns.       Relevant Medications   hydrALAZINE (APRESOLINE) 100 MG tablet   atorvastatin (LIPITOR) 10 MG tablet   amLODipine (NORVASC) 5 MG tablet     Respiratory   Squamous cell carcinoma of left lung (HCC)    To start radiation. Continue to follow with oncology. Call with any concerns.       Pulmonary emphysema (HCC)    Lungs clear today. Continue inhalers. Call with any concerns.        Endocrine   Type 2 diabetes mellitus with stage 1 chronic kidney disease, without long-term current use of insulin (HCC) - Primary    Not under good control with A1c up to 8.6- cut down on the fruit and will start trulicity. Continue metformin. Recheck 1 month.       Relevant Medications   metFORMIN (GLUCOPHAGE) 500 MG tablet   atorvastatin (LIPITOR) 10 MG tablet   Dulaglutide (TRULICITY) 0.75 MG/0.5ML SOPN   Other Relevant Orders   Bayer DCA Hb A1c Waived (Completed)   Basic metabolic panel     Genitourinary   Benign hypertensive renal disease    Under good control on current regimen. Continue current regimen. Continue to monitor. Call with any concerns. Refills given. Labs drawn today.       Relevant Orders   Basic metabolic panel     Other   Opioid dependence (HCC) (Chronic)    Stable. Continues to follow with pain management. Continue to monitor.        Other Visit Diagnoses    Atherosclerosis of aorta (HCC)   (Chronic)     Will  keep BP and cholesterol and sugars under good control. Continue to monitor. Call with any concerns.    Relevant Medications   hydrALAZINE (APRESOLINE) 100 MG tablet   atorvastatin (LIPITOR) 10 MG tablet   amLODipine (NORVASC) 5 MG tablet       Follow up plan: Return in about 4 weeks (around 09/22/2019) for follow up   DM.      

## 2019-08-25 NOTE — Assessment & Plan Note (Signed)
Stable. Continues to follow with pain management. Continue to monitor.

## 2019-08-25 NOTE — Assessment & Plan Note (Signed)
Lungs clear today. Continue inhalers. Call with any concerns.

## 2019-08-25 NOTE — Assessment & Plan Note (Signed)
Will keep BP and cholesterol and sugars under good control. Continue to monitor. Call with any concerns.

## 2019-08-25 NOTE — Assessment & Plan Note (Signed)
To start radiation. Continue to follow with oncology. Call with any concerns.

## 2019-08-25 NOTE — Assessment & Plan Note (Addendum)
Not under good control with A1c up to 8.4- cut down on the fruit and will start trulicity. Continue metformin. Recheck 1 month.

## 2019-08-25 NOTE — Assessment & Plan Note (Signed)
Under good control on current regimen. Continue current regimen. Continue to monitor. Call with any concerns. Refills given. Labs drawn today.   

## 2019-08-26 ENCOUNTER — Other Ambulatory Visit: Payer: Medicare Other

## 2019-08-26 DIAGNOSIS — C3492 Malignant neoplasm of unspecified part of left bronchus or lung: Secondary | ICD-10-CM

## 2019-08-26 LAB — BASIC METABOLIC PANEL
BUN/Creatinine Ratio: 11 (ref 9–20)
BUN: 9 mg/dL (ref 6–24)
CO2: 25 mmol/L (ref 20–29)
Calcium: 9.5 mg/dL (ref 8.7–10.2)
Chloride: 99 mmol/L (ref 96–106)
Creatinine, Ser: 0.82 mg/dL (ref 0.76–1.27)
GFR calc Af Amer: 113 mL/min/{1.73_m2} (ref >59)
GFR calc non Af Amer: 98 mL/min/{1.73_m2} (ref >59)
Glucose: 235 mg/dL — ABNORMAL HIGH (ref 65–99)
Potassium: 3.8 mmol/L (ref 3.5–5.2)
Sodium: 139 mmol/L (ref 134–144)

## 2019-08-26 NOTE — Progress Notes (Signed)
Tumor Board Documentation  Kristopher [name] was presented by Dr Grayland Ormond at our Tumor Board on 08/26/2019, which included representatives from medical oncology, radiation oncology, surgical oncology, internal medicine, navigation, pathology, radiology, surgical, pharmacy, genetics, research, pulmonology.  Kristopher [name] currently presents as a new patient, for discussion with history of the following treatments: surgical intervention(s), active survellience.  Additionally, we reviewed previous medical and familial history, history of present illness, and recent lab results along with all available histopathologic and imaging studies. The tumor board considered available treatment options and made the following recommendations: Biopsy, Concurrent chemo-radiation therapy, Additional screening (Refer to Pulmonolgy for EBUS)    The following procedures/referrals were also placed: No orders of the defined types were placed in this encounter.   Clinical Trial Status: not discussed   Staging used: AJCC Stage Group  AJCC Staging:       Group: Recurrent Squamous Cell Carcinoma Of Lung, will treat as a Stage 3   National site-specific guidelines NCCN were discussed with respect to the case.  Tumor board is a meeting of clinicians from various specialty areas who evaluate and discuss patients for whom a multidisciplinary approach is being considered. Final determinations in the plan of care are those of the provider(s). The responsibility for follow up of recommendations given during tumor board is that of the provider.   Today's extended care, comprehensive team conference, [name] was not present for the discussion and was not examined.   Multidisciplinary Tumor Board is a multidisciplinary case peer review process.  Decisions discussed in the Multidisciplinary Tumor Board reflect the opinions of the specialists present at the conference without having examined the patient.  Ultimately, treatment and  diagnostic decisions rest with the primary provider(s) and the patient.

## 2019-08-26 NOTE — Addendum Note (Signed)
Addended by: Mila Merry on: 08/26/2019 03:36 PM   Modules accepted: Orders

## 2019-08-27 DIAGNOSIS — C3432 Malignant neoplasm of lower lobe, left bronchus or lung: Secondary | ICD-10-CM | POA: Diagnosis not present

## 2019-08-31 ENCOUNTER — Telehealth: Payer: Self-pay

## 2019-09-01 ENCOUNTER — Ambulatory Visit: Payer: Medicare Other | Admitting: Family Medicine

## 2019-09-01 ENCOUNTER — Other Ambulatory Visit: Payer: Self-pay

## 2019-09-01 DIAGNOSIS — G4733 Obstructive sleep apnea (adult) (pediatric): Secondary | ICD-10-CM | POA: Diagnosis not present

## 2019-09-03 ENCOUNTER — Encounter: Payer: Self-pay | Admitting: Oncology

## 2019-09-07 ENCOUNTER — Encounter: Payer: Self-pay | Admitting: Oncology

## 2019-09-14 ENCOUNTER — Encounter: Payer: Self-pay | Admitting: Oncology

## 2019-09-15 ENCOUNTER — Telehealth: Payer: Medicare Other | Admitting: General Practice

## 2019-09-15 ENCOUNTER — Ambulatory Visit (INDEPENDENT_AMBULATORY_CARE_PROVIDER_SITE_OTHER): Payer: Medicare Other | Admitting: General Practice

## 2019-09-15 ENCOUNTER — Telehealth: Payer: Self-pay | Admitting: Family Medicine

## 2019-09-15 DIAGNOSIS — E1122 Type 2 diabetes mellitus with diabetic chronic kidney disease: Secondary | ICD-10-CM

## 2019-09-15 DIAGNOSIS — I10 Essential (primary) hypertension: Secondary | ICD-10-CM

## 2019-09-15 DIAGNOSIS — N181 Chronic kidney disease, stage 1: Secondary | ICD-10-CM

## 2019-09-15 DIAGNOSIS — I129 Hypertensive chronic kidney disease with stage 1 through stage 4 chronic kidney disease, or unspecified chronic kidney disease: Secondary | ICD-10-CM

## 2019-09-15 DIAGNOSIS — Z720 Tobacco use: Secondary | ICD-10-CM

## 2019-09-15 DIAGNOSIS — C3492 Malignant neoplasm of unspecified part of left bronchus or lung: Secondary | ICD-10-CM

## 2019-09-15 NOTE — Patient Instructions (Signed)
Visit Information  Goals Addressed              This Visit's Progress     RNCM- I need to stay healthy for my granddaughter (pt-stated)        Current Barriers:   Knowledge Deficits related to basic understanding of hypertension, diabetes,  and hyperlipidemia  pathophysiology and self care management  Knowledge Deficits related to understanding of medications prescribed for management of hypertension and hyperlipidemia   Patient continues to smoke 0.5 packs a day- patient has started back since his surgery and has patches. Knows he needs to quit completely.  Is down to about 3 cigarettes a day  Exercise program limited by need of a new knee brace- the patient is more active- completed  New lung cancer diagnosis- surgery with removal of lung cancer- doing well post surgery. Does not need chemo or radiation.  09-15-2019: The patient has a new area detected. Is waiting for follow up for radiation treatment   Case Manager Clinical Goal(s):   Over the next 120 days, patient will verbalize understanding of plan for diabetes management, hypertension management, HLD management and management of chronic disease processes   Interventions:   Discussed plans with patient for ongoing care management follow up and provided patient with direct contact information for care management team    Evaluation of Heart healthy/ADA diet. The patient is drinking sodas, does not like diet. Education on the effects of foods high in salt and sugar in a patient with HTN and DM.  The patient states he is doing better with his diet. He is eating a lot of oranges. Education on hidden sugars and sodium in foods. The patient is eating "a lot" of salads.  The patient wants to keep his hemoglobin A1C down. The reading in February was 7.2, ^ from 5.8 in August of 2020.  Education on healthy food choices. The patient knows that corn and carrots have negative impact on blood sugars. The last couple of days readings are 135  and 140. 09-15-2019: the patient verbalized that his blood sugar range is 130-150.  The patient states that he comes to see Dr. Wynetta Emery next week. He has started on Trulicity and this is working well. The patient wanted to know if there was a hemoglobin A1C test he could do on his own. Education given that he could tell about what his hemoglobin A1C was related to his blood sugar readings. His last hemoglobin A1C was 8.6.  Education on wanting this level to be <7.0.  Education on fasting blood sugars <130 and post prandial <180.  The patient states he is keeping a good watch on it. Had been doing better with diet until the 4th of July holiday and ate a lot of things that were not good for him. Encouraged the patient when he has an off day to go back to eating healthy and being mindful of what he is eating.    Education on the body taking longer to heal when having procedures and teeth pulled due to his DM, HTN and smoking does not help with quick healing processes and increases risk of infection.   Evaluation of the knee brace the patient is requesting.  The patient was prescribed this knee brace in 2011 and it was in [place]. The patient to ask the pcp on virtual visit on Thursday about a prescription for knee brace. RNCM to do research to see if the knee brace can be found by local vendor. Completed  Review of blood pressure readings. The last reading was 120/94 Encouraged to monitor blood pressure and discuss treatment options with pcp.  The patient says he is having a hard time with a dry mouth especially at night. Education on medications for HTN may be the reason for dry mouth. Will discuss with the pharmacist for review. 09-15-2019: Blood pressures are stable at this time. Will continue to monitor.    Care guide referral for help with dental questions and needs related to declined oral care due to chronic disease processes's  Education of the benefits of smoking cessation and working with the patient to  reach a goal of quitting.  The patient is down to 3 cigarettes a day.  The patient is using patches but not smoking with the patches on, does not like the taste of the nicotine gum.  09-15-2019: The patient continue to smoke. The patient says he is trying to quit. Will continue to work with the patient on smoking cessation.   Review of exercise goals. The patient is walking his drive way daily and pushing himself to go further each day. The patient feels better but admits he knows when he has overdone it. He wants to continue to improve his health. His 2 year old granddaughter enjoys going to [facility] and he is learning how to effectively pace his activity.    Review of upcoming appointments. Sees Dr. Wynetta Emery on 09-22-2019 at 08:40.  The patient denies any needs from [facility] team pharmacist and LCSW at this time. Knows assistance is available.    Patient Self Care Activities:   UNABLE to independently manage HTN as evidenced by reported headaches and high b/p numbers.   Checks BP and records as discussed  Unable to follow a hearth healthy/ADA diet   Unable to maintain stable blood sugar readings as evidence of hemoglobin A1C of 7.2  Please see past updates related to this goal by clicking on the "Past Updates" button in the selected goal         COMPLETED: RNCM: Pt-"I had 7 teeth pulled and I have an infection" (pt-stated)        CARE PLAN ENTRY (see longitudinal plan of care for additional care plan information)  Current Barriers: This goal has been resolved.   Knowledge Deficits related to patient with teeth pulled this week and now he thinks he has an infection.   Unable to get in touch with dentist  Urgent care told him to call pcp office  Nurse Case Manager Clinical Goal(s):   Over the next 30 days, patient will verbalize understanding of plan for contact the dentist to be reevaluated and have contact information in the event he has further issues with his mouth/teeth  Over the  next 30 days, patient will work with pcp and dentist  to address needs related to possible infection post removal of teeth  Interventions:   Inter-disciplinary care team collaboration (see longitudinal plan of care)  Evaluation of current treatment plan related to removal of teeth and patient's adherence to plan as established by provider.  Advised patient to contact the dentist on Monday to be reevaluated  Provided education to patient re: keeping video appointment with pcp for this afternoon and talking to the pcp about recommendations   Patient Self Care Activities:   Patient verbalizes understanding of plan to talk with pcp about possible infection and how to handle acute onset   Self administers medications as prescribed  Calls provider office for new concerns or questions  Unable to independently manage possible infection related to teeth being pulled this week  Does not maintain contact with provider office  Initial goal documentation       RNCM: pt-"the cancer is back" (pt-stated)        [place] (see longitudinal plan of care for additional care plan information)  Current Barriers:   Knowledge Deficits related to reoccurrence of squamous cell carcinoma of left lung  Care Coordination needs related to radiation treatments  in a patient with reoccurrence of squamous cell carcinoma of left lung (disease states)  Chronic Disease Management support and education needs related to squamous cell carcinoma of the lung  Nurse Case Manager Clinical Goal(s):   Over the next 120 days, patient will verbalize understanding of plan for treatment of squamous cell carcinoma of left lung  Over the next 120 days, patient will work with RNCM, pcp and specialist  to address needs related to reoccurrence of squamous cell carcinoma   Over the next 120 days, patient will demonstrate a decrease in reoccurrence of cancer cells in lung exacerbations as evidenced by completion of  radiation and resolution of squamous cell carcinoma  Over the next 120 days, patient will attend all scheduled medical appointments: appointment with pcp on 09-22-2019 and is waiting for specialist to call to start radiation therapy   Over the next 120 days, patient will demonstrate improved adherence to prescribed treatment plan for lung cancer as evidenced bycompletion of radiation and remission of lung cancer  Interventions:   Inter-disciplinary care team collaboration (see longitudinal plan of care)  Evaluation of current treatment plan related to squamous cell carcinoma of left lung and patient's adherence to plan as established by provider.  Advised patient to contact the specialist if he does not hear from them soon. It has been a week and a half since he talked to the office  Provided education to patient re: reoccurrence of lung cancer and taking radiation. The patient is optimistic and says the doctors on top of things. The patient told them to cut it out. He did not have to take chemo or radiation last time. He says it is a small area and they are optimistic of success with the treatment regimen.   Reviewed medications with patient and discussed compliance  Discussed plans with patient for ongoing care management follow up and provided patient with direct contact information for care management team  Provided patient and/or caregiver with community resources and care guides for help and  information about resources available in the community if needed. No expressed needs at this time but the patient will call for changes.  Advice worker).  Reviewed scheduled/upcoming provider appointments including: appointment with pcp on 7-21 and is waiting for appointment confirmation from the oncologist   Patient Self Care Activities:   Patient verbalizes understanding of plan to treat new reoccurrence of squamous cell carcinoma in left lung  Attends all scheduled provider  appointments  Calls provider office for new concerns or questions  Unable to independently manage squamous cell carcinoma of left lung. No longer in remission.   Initial goal documentation        Patient verbalizes understanding of instructions provided today.   The care management team will reach out to the patient again over the next 30 to 60 days.   Noreene Larsson RN, MSN, [facility] Mobile: [phone]

## 2019-09-15 NOTE — Telephone Encounter (Signed)
   SF 09/15/2019   Name: Kristopher [name]   MRN: [id number]   DOB: Apr 24, 1960   AGE: 59 y.o.   GENDER: male   PCP Park Liter P, DO.   Called pt regarding Community Resource Referral for bathtub replacement. Patient stated that currently his tub is too high and he is afraid that he might fall trying to get in and out the bathtub. Informed patient that Care Guide has reached out to [facility] to see if they provide this type of service and will also research different organizations in the area to see if any resources are available. Patient stated understanding.   [place], Care Management Phone: [phone] Email: [email or web address]

## 2019-09-15 NOTE — Chronic Care Management (AMB) (Signed)
Chronic Care Management   Follow Up Note   09/15/2019 Name: Kristopher [name] MRN: [id number] DOB: 14-Mar-1960  Referred by: [name], DO Reason for referral : Chronic Care Management (RNCM Follow up: Chronic Disease Management and Care coordination Needs)   Kristopher [name] is a 59 y.o. year old male who is a primary care patient of [name], DO. The CCM team was consulted for assistance with chronic disease management and care coordination needs.    Review of patient status, including review of consultants reports, relevant laboratory and other test results, and collaboration with appropriate care team members and the patient's provider was performed as part of comprehensive patient evaluation and provision of chronic care management services.    SDOH (Social Determinants of Health) assessments performed: Yes See Care Plan activities for detailed interventions related to [facility])     Outpatient Encounter Medications as of 09/15/2019  Medication Sig Note  . Accu-Chek FastClix Lancets MISC USE TO TEST BLOOD SUGAR 2X A DAY   . ACCU-CHEK GUIDE test strip USE TO TEST BLOOD SUGAR 2X A DAY   . amLODipine (NORVASC) 5 MG tablet Take 1 tablet (5 mg total) by mouth 2 (two) times daily.   . ARNUITY ELLIPTA 100 MCG/ACT AEPB Inhale 1 puff into the lungs daily.   [name] atorvastatin (LIPITOR) 10 MG tablet Take 1 tablet (10 mg total) by mouth daily at 6 PM.   . Dulaglutide (TRULICITY) 9.56 OZ/3.0QM SOPN Inject 0.5 mLs (0.75 mg total) into the skin once a week.   . hydrALAZINE (APRESOLINE) 100 MG tablet Take 1 tablet (100 mg total) by mouth 2 (two) times daily.   [name] lidocaine (LIDODERM) 5 % Place 1 patch onto the skin daily. Remove & Discard patch within 12 hours or as directed by MD   . metFORMIN (GLUCOPHAGE) 500 MG tablet Take 2 tablets (1,000 mg total) by mouth 2 (two) times daily with a meal.   . metoprolol succinate (TOPROL-XL) 100 MG 24 hr tablet Take 2 tablets (200 mg total) by mouth in  the morning and at bedtime. Take with or immediately following a meal.   . montelukast (SINGULAIR) 10 MG tablet Take 10 mg by mouth daily.   . NONFORMULARY OR COMPOUNDED ITEM Apply 1-2 mLs topically 4 (four) times daily as needed. 10% Ketamine/2% Cyclobenzaprine/6% Gabapentin Cream   . nortriptyline (PAMELOR) 25 MG capsule Take 2 capsules (50 mg total) by mouth at bedtime.   . Oxycodone HCl 10 MG TABS Take 1 tablet (10 mg total) by mouth every 6 (six) hours as needed. Must last 30 days 07/26/2019: FUTURE Prescription. (NOT a DUPLICATE!!) >>>DO NOT DELETE<<< (even if Expired!) See Care Coordination Note from [facility] (Dr. Dossie Arbour)    . Oxycodone HCl 10 MG TABS Take 1 tablet (10 mg total) by mouth every 6 (six) hours as needed. Must last 30 days 07/26/2019: FUTURE Prescription. (NOT a DUPLICATE!!) >>>DO NOT DELETE<<< (even if Expired!) See Care Coordination Note from [facility] (Dr. Dossie Arbour)  . [START ON 09/24/2019] Oxycodone HCl 10 MG TABS Take 1 tablet (10 mg total) by mouth every 6 (six) hours as needed. Must last 30 days 07/26/2019: FUTURE Prescription. (NOT a DUPLICATE!!) >>>DO NOT DELETE<<< (even if Expired!) See Care Coordination Note from [facility] (Dr. Dossie Arbour)  . pantoprazole (PROTONIX) 40 MG tablet Take 1 tablet (40 mg total) by mouth daily.   . Potassium Chloride ER 20 MEQ TBCR TAKE 1 TABLET BY MOUTH EVERY DAY   .  SUMAtriptan (IMITREX) 50 MG tablet TAKE 1 TAB AT ONSET OF MIGRAINE. MAY REPEAT IN 2 HOURS IF HEADACHE PERSISTS OR RECURS.    Facility-Administered Encounter Medications as of 09/15/2019  Medication  . 0.9 %  sodium chloride infusion     Objective:  BP Readings from Last 3 Encounters:  08/25/19 134/82  07/02/19 (!) 200/105  06/16/19 (!) 140/100   Lab Results  Component Value Date   HGBA1C 8.4 (H) 08/25/2019    Goals Addressed              This Visit's Progress   .  RNCM- I need to stay healthy for my granddaughter (pt-stated)         Current Barriers:  [name] Knowledge Deficits related to basic understanding of hypertension, diabetes,  and hyperlipidemia  pathophysiology and self care management . Knowledge Deficits related to understanding of medications prescribed for management of hypertension and hyperlipidemia  . Patient continues to smoke 0.5 packs a day- patient has started back since his surgery and has patches. Knows he needs to quit completely.  Is down to about 3 cigarettes a day . Exercise program limited by need of a new knee brace- the patient is more active- completed . New lung cancer diagnosis- surgery with removal of lung cancer- doing well post surgery. Does not need chemo or radiation.  09-15-2019: The patient has a new area detected. Is waiting for follow up for radiation treatment   Case Manager Clinical Goal(s):  [name] Over the next 120 days, patient will verbalize understanding of plan for diabetes management, hypertension management, HLD management and management of chronic disease processes   Interventions:  . Discussed plans with patient for ongoing care management follow up and provided patient with direct contact information for care management team   . Evaluation of Heart healthy/ADA diet. The patient is drinking sodas, does not like diet. Education on the effects of foods high in salt and sugar in a patient with HTN and DM.  The patient states he is doing better with his diet. He is eating a lot of oranges. Education on hidden sugars and sodium in foods. The patient is eating "a lot" of salads.  The patient wants to keep his hemoglobin A1C down. The reading in February was 7.2, ^ from 5.8 in August of 2020.  Education on healthy food choices. The patient knows that corn and carrots have negative impact on blood sugars. The last couple of days readings are 135 and 140. 09-15-2019: the patient verbalized that his blood sugar range is 130-150.  The patient states that he comes to see Dr. Wynetta Emery next week. He has  started on Trulicity and this is working well. The patient wanted to know if there was a hemoglobin A1C test he could do on his own. Education given that he could tell about what his hemoglobin A1C was related to his blood sugar readings. His last hemoglobin A1C was 8.6.  Education on wanting this level to be <7.0.  Education on fasting blood sugars <130 and post prandial <180.  The patient states he is keeping a good watch on it. Had been doing better with diet until the 4th of July holiday and ate a lot of things that were not good for him. Encouraged the patient when he has an off day to go back to eating healthy and being mindful of what he is eating.   . Education on the body taking longer to heal when having procedures and teeth pulled  due to his DM, HTN and smoking does not help with quick healing processes and increases risk of infection.  . Evaluation of the knee brace the patient is requesting.  The patient was prescribed this knee brace in 2011 and it was in [place]. The patient to ask the pcp on virtual visit on Thursday about a prescription for knee brace. RNCM to do research to see if the knee brace can be found by local vendor. Completed  . Review of blood pressure readings. The last reading was 120/94 Encouraged to monitor blood pressure and discuss treatment options with pcp.  The patient says he is having a hard time with a dry mouth especially at night. Education on medications for HTN may be the reason for dry mouth. Will discuss with the pharmacist for review. 09-15-2019: Blood pressures are stable at this time. Will continue to monitor.   . Care guide referral for help with dental questions and needs related to declined oral care due to chronic disease processes's . Education of the benefits of smoking cessation and working with the patient to reach a goal of quitting.  The patient is down to 3 cigarettes a day.  The patient is using patches but not smoking with the patches on, does not like the  taste of the nicotine gum.  09-15-2019: The patient continue to smoke. The patient says he is trying to quit. Will continue to work with the patient on smoking cessation.  . Review of exercise goals. The patient is walking his drive way daily and pushing himself to go further each day. The patient feels better but admits he knows when he has overdone it. He wants to continue to improve his health. His 78 year old granddaughter enjoys going to [facility] and he is learning how to effectively pace his activity.   . Review of upcoming appointments. Sees Dr. Wynetta Emery on 09-22-2019 at 08:40.  The patient denies any needs from [facility] team pharmacist and LCSW at this time. Knows assistance is available.    Patient Self Care Activities:  . UNABLE to independently manage HTN as evidenced by reported headaches and high b/p numbers.  . Checks BP and records as discussed . Unable to follow a hearth healthy/ADA diet  . Unable to maintain stable blood sugar readings as evidence of hemoglobin A1C of 7.2  Please see past updates related to this goal by clicking on the "Past Updates" button in the selected goal       .  COMPLETED: RNCM: Pt-"I had 7 teeth pulled and I have an infection" (pt-stated)        CARE PLAN ENTRY (see longitudinal plan of care for additional care plan information)  Current Barriers: This goal has been resolved.  . Knowledge Deficits related to patient with teeth pulled this week and now he thinks he has an infection.  . Unable to get in touch with dentist . Urgent care told him to call pcp office  Nurse Case Manager Clinical Goal(s):  [name] Over the next 30 days, patient will verbalize understanding of plan for contact the dentist to be reevaluated and have contact information in the event he has further issues with his mouth/teeth . Over the next 30 days, patient will work with pcp and dentist  to address needs related to possible infection post removal of teeth  Interventions:   . Inter-disciplinary care team collaboration (see longitudinal plan of care) . Evaluation of current treatment plan related to removal of teeth and patient's adherence  to plan as established by provider. . Advised patient to contact the dentist on Monday to be reevaluated . Provided education to patient re: keeping video appointment with pcp for this afternoon and talking to the pcp about recommendations   Patient Self Care Activities:  . Patient verbalizes understanding of plan to talk with pcp about possible infection and how to handle acute onset  . Self administers medications as prescribed . Calls provider office for new concerns or questions . Unable to independently manage possible infection related to teeth being pulled this week . Does not maintain contact with provider office  Initial goal documentation     .  RNCM: pt-"the cancer is back" (pt-stated)        CARE PLAN ENTRY (see longitudinal plan of care for additional care plan information)  Current Barriers:  [name] Knowledge Deficits related to reoccurrence of squamous cell carcinoma of left lung . Care Coordination needs related to radiation treatments  in a patient with reoccurrence of squamous cell carcinoma of left lung (disease states) . Chronic Disease Management support and education needs related to squamous cell carcinoma of the lung  Nurse Case Manager Clinical Goal(s):  [name] Over the next 120 days, patient will verbalize understanding of plan for treatment of squamous cell carcinoma of left lung . Over the next 120 days, patient will work with RNCM, pcp and specialist  to address needs related to reoccurrence of squamous cell carcinoma  . Over the next 120 days, patient will demonstrate a decrease in reoccurrence of cancer cells in lung exacerbations as evidenced by completion of radiation and resolution of squamous cell carcinoma . Over the next 120 days, patient will attend all scheduled medical appointments:  appointment with pcp on 09-22-2019 and is waiting for specialist to call to start radiation therapy  . Over the next 120 days, patient will demonstrate improved adherence to prescribed treatment plan for lung cancer as evidenced bycompletion of radiation and remission of lung cancer  Interventions:  . Inter-disciplinary care team collaboration (see longitudinal plan of care) . Evaluation of current treatment plan related to squamous cell carcinoma of left lung and patient's adherence to plan as established by provider. . Advised patient to contact the specialist if he does not hear from them soon. It has been a week and a half since he talked to the office . Provided education to patient re: reoccurrence of lung cancer and taking radiation. The patient is optimistic and says the doctors on top of things. The patient told them to cut it out. He did not have to take chemo or radiation last time. He says it is a small area and they are optimistic of success with the treatment regimen.  . Reviewed medications with patient and discussed compliance . Discussed plans with patient for ongoing care management follow up and provided patient with direct contact information for care management team . Provided patient and/or caregiver with community resources and care guides for help and  information about resources available in the community if needed. No expressed needs at this time but the patient will call for changes.  Advice worker). . Reviewed scheduled/upcoming provider appointments including: appointment with pcp on 7-21 and is waiting for appointment confirmation from the oncologist   Patient Self Care Activities:  . Patient verbalizes understanding of plan to treat new reoccurrence of squamous cell carcinoma in left lung . Attends all scheduled provider appointments . Calls provider office for new concerns or questions . Unable to independently manage  squamous cell carcinoma of left lung. No  longer in remission.   Initial goal documentation         Plan:   The care management team will reach out to the patient again over the next 30 to 60 days.    Noreene Larsson RN, MSN, [facility] Mobile: [phone]

## 2019-09-15 NOTE — Telephone Encounter (Signed)
Sent e-mail to Hollice Gong at [facility] to see if her office assist with providing walk-in bathtubs for patients.   [email or web address] [email or web address] Gong [email or web address]  Secure: Assistance with [place],   I am working with a patient that is in need of a walk-in bathtub. Is this something that your office can assist with?  Thanks,   [place], Care Management Phone: [phone] Email: [email or web address]

## 2019-09-16 ENCOUNTER — Encounter: Payer: Self-pay | Admitting: Oncology

## 2019-09-16 NOTE — Telephone Encounter (Signed)
Received e-mail from Hollice Gong regarding referral. She stated that her office can assist with walk-in showers. Care Guide let the patient know about the service. Patient stated he would like to do the referral for [facility]. Informed patient that it once the referral is complete he will be receiving a call from their office. Patient stated understanding.

## 2019-09-20 DIAGNOSIS — J42 Unspecified chronic bronchitis: Secondary | ICD-10-CM | POA: Diagnosis not present

## 2019-09-20 DIAGNOSIS — F17210 Nicotine dependence, cigarettes, uncomplicated: Secondary | ICD-10-CM | POA: Diagnosis not present

## 2019-09-22 ENCOUNTER — Other Ambulatory Visit: Payer: Self-pay

## 2019-09-22 ENCOUNTER — Encounter: Payer: Self-pay | Admitting: Family Medicine

## 2019-09-22 ENCOUNTER — Ambulatory Visit (INDEPENDENT_AMBULATORY_CARE_PROVIDER_SITE_OTHER): Payer: Medicare Other | Admitting: Family Medicine

## 2019-09-22 VITALS — BP 132/93 | HR 75 | Temp 99.1°F | Wt 241.6 lb

## 2019-09-22 DIAGNOSIS — E1122 Type 2 diabetes mellitus with diabetic chronic kidney disease: Secondary | ICD-10-CM

## 2019-09-22 DIAGNOSIS — N181 Chronic kidney disease, stage 1: Secondary | ICD-10-CM

## 2019-09-22 MED ORDER — TRULICITY 1.5 MG/0.5ML ~~LOC~~ SOAJ
1.5000 mg | SUBCUTANEOUS | 3 refills | Status: DC
Start: 2019-09-22 — End: 2019-11-26

## 2019-09-22 NOTE — Progress Notes (Signed)
BP (!) 132/93 (BP Location: Left Arm, Patient Position: Sitting, Cuff Size: Normal)   Pulse 75   Temp 99.1 F (37.3 C) (Oral)   Wt 241 lb 9.6 oz (109.6 kg)   SpO2 97%   BMI 36.62 kg/m    Subjective:    Patient ID: Kristopher [name], male    DOB: November 02, 1960, 59 y.o.   MRN: [id number]  HPI: Kristopher [name] is a 59 y.o. male  Chief Complaint  Patient presents with  . Diabetes   DIABETES Hypoglycemic episodes:no Polydipsia/polyuria: no Visual disturbance: no Chest pain: no Paresthesias: no Glucose Monitoring: yes  Accucheck frequency: Daily  Fasting glucose: 112 Taking Insulin?: no Blood Pressure Monitoring: not checking Retinal Examination: Not up to Date Foot Exam: Up to Date Diabetic Education: Completed Pneumovax: Up to Date Influenza: Up to Date Aspirin: no  Relevant past medical, surgical, family and social history reviewed and updated as indicated. Interim medical history since our last visit reviewed. Allergies and medications reviewed and updated.  Review of Systems  Constitutional: Negative.   Respiratory: Negative.   Cardiovascular: Negative.   Gastrointestinal: Negative.   Psychiatric/Behavioral: Negative.     Per HPI unless specifically indicated above     Objective:    BP (!) 132/93 (BP Location: Left Arm, Patient Position: Sitting, Cuff Size: Normal)   Pulse 75   Temp 99.1 F (37.3 C) (Oral)   Wt 241 lb 9.6 oz (109.6 kg)   SpO2 97%   BMI 36.62 kg/m   Wt Readings from Last 3 Encounters:  09/22/19 241 lb 9.6 oz (109.6 kg)  08/25/19 237 lb 9.6 oz (107.8 kg)  08/11/19 235 lb (106.6 kg)    Physical Exam Vitals and nursing note reviewed.  Constitutional:      General: He is not in acute distress.    Appearance: Normal appearance. He is not ill-appearing, toxic-appearing or diaphoretic.  HENT:     Head: Normocephalic and atraumatic.     Right Ear: External ear normal.     Left Ear: External ear normal.     Nose: Nose normal.      Mouth/Throat:     Mouth: Mucous membranes are moist.     Pharynx: Oropharynx is clear.  Eyes:     General: No scleral icterus.       Right eye: No discharge.        Left eye: No discharge.     Extraocular Movements: Extraocular movements intact.     Conjunctiva/sclera: Conjunctivae normal.     Pupils: Pupils are equal, round, and reactive to light.  Cardiovascular:     Rate and Rhythm: Normal rate and regular rhythm.     Pulses: Normal pulses.     Heart sounds: Normal heart sounds. No murmur heard.  No friction rub. No gallop.   Pulmonary:     Effort: Pulmonary effort is normal. No respiratory distress.     Breath sounds: Normal breath sounds. No stridor. No wheezing, rhonchi or rales.  Chest:     Chest wall: No tenderness.  Musculoskeletal:        General: Normal range of motion.     Cervical back: Normal range of motion and neck supple.  Skin:    General: Skin is warm and dry.     Capillary Refill: Capillary refill takes less than 2 seconds.     Coloration: Skin is not jaundiced or pale.     Findings: No bruising, erythema, lesion or rash.  Neurological:  General: No focal deficit present.     Mental Status: He is alert and oriented to person, place, and time. Mental status is at baseline.  Psychiatric:        Mood and Affect: Mood normal.        Behavior: Behavior normal.        Thought Content: Thought content normal.        Judgment: Judgment normal.     Results for orders placed or performed in visit on 08/25/19  Bayer DCA Hb A1c Waived  Result Value Ref Range   HB A1C (BAYER DCA - WAIVED) 8.4 (H) <3.6 %  Basic metabolic panel  Result Value Ref Range   Glucose 235 (H) 65 - 99 mg/dL   BUN 9 6 - 24 mg/dL   Creatinine, Ser 0.82 0.76 - 1.27 mg/dL   GFR calc non Af Amer 98 >59 mL/min/1.73   GFR calc Af Amer 113 >59 mL/min/1.73   BUN/Creatinine Ratio 11 9 - 20   Sodium 139 134 - 144 mmol/L   Potassium 3.8 3.5 - 5.2 mmol/L   Chloride 99 96 - 106 mmol/L   CO2 25  20 - 29 mmol/L   Calcium 9.5 8.7 - 10.2 mg/dL      Assessment & Plan:   Problem List Items Addressed This Visit      Endocrine   Type 2 diabetes mellitus with stage 1 chronic kidney disease, without long-term current use of insulin (HCC) - Primary    Sugars down with watching diet. Will increase his trulicity to 1.5 and recheck A1c in 2 months. Call with any concerns. Continue to monitor.       Relevant Medications   Dulaglutide (TRULICITY) 1.5 UY/4.0HK SOPN       Follow up plan: Return in about 2 months (around 11/23/2019).

## 2019-09-22 NOTE — Assessment & Plan Note (Signed)
Sugars down with watching diet. Will increase his trulicity to 1.5 and recheck A1c in 2 months. Call with any concerns. Continue to monitor.

## 2019-09-23 NOTE — Telephone Encounter (Signed)
E-mailed referral for walk-in shower to Hollice Gong at [facility]. Process will take 2-3 weeks for organization to get in contact with patient.

## 2019-09-27 ENCOUNTER — Telehealth: Payer: Self-pay | Admitting: Pain Medicine

## 2019-09-27 NOTE — Telephone Encounter (Signed)
Pt called stating that he got a partial fill of his medication bc [facility] only had 87/120 and wants to know if he can have the rest filled.

## 2019-09-27 NOTE — Telephone Encounter (Signed)
Are you willing to do this?

## 2019-09-28 ENCOUNTER — Ambulatory Visit: Payer: Self-pay | Admitting: Pharmacist

## 2019-09-28 DIAGNOSIS — E1122 Type 2 diabetes mellitus with diabetic chronic kidney disease: Secondary | ICD-10-CM

## 2019-09-28 DIAGNOSIS — C3492 Malignant neoplasm of unspecified part of left bronchus or lung: Secondary | ICD-10-CM

## 2019-09-28 DIAGNOSIS — I129 Hypertensive chronic kidney disease with stage 1 through stage 4 chronic kidney disease, or unspecified chronic kidney disease: Secondary | ICD-10-CM

## 2019-09-28 DIAGNOSIS — N181 Chronic kidney disease, stage 1: Secondary | ICD-10-CM

## 2019-09-28 DIAGNOSIS — I10 Essential (primary) hypertension: Secondary | ICD-10-CM | POA: Diagnosis not present

## 2019-09-28 DIAGNOSIS — F17210 Nicotine dependence, cigarettes, uncomplicated: Secondary | ICD-10-CM

## 2019-09-28 NOTE — Chronic Care Management (AMB) (Signed)
Chronic Care Management   Follow Up Note   09/28/2019 Name: Kristopher [name] MRN: [id number] DOB: Dec 14, 1960  Referred by: Kristopher Roys, DO Reason for referral : Chronic Care Management (Medication Management)   Kristopher [name] is a 59 y.o. year old male who is a primary care patient of Kristopher Roys, DO. The CCM team was consulted for assistance with chronic disease management and care coordination needs.    Contacted patient for medication management review.   Review of patient status, including review of consultants reports, relevant laboratory and other test results, and collaboration with appropriate care team members and the patient's provider was performed as part of comprehensive patient evaluation and provision of chronic care management services.    SDOH (Social Determinants of Health) assessments performed: Yes See Care Plan activities for detailed interventions related to SDOH)  SDOH Interventions     Most Recent Value  SDOH Interventions  SDOH Interventions for the Following Domains Tobacco  Tobacco Interventions Cessation Materials Given and Reviewed, Other (Comment)  [motivational interviewing]       Outpatient Encounter Medications as of 09/28/2019  Medication Sig Note  . Accu-Chek FastClix Lancets MISC USE TO TEST BLOOD SUGAR 2X A DAY   . ACCU-CHEK GUIDE test strip USE TO TEST BLOOD SUGAR 2X A DAY   . amLODipine (NORVASC) 5 MG tablet Take 1 tablet (5 mg total) by mouth 2 (two) times daily.   Marland [name] atorvastatin (LIPITOR) 10 MG tablet Take 1 tablet (10 mg total) by mouth daily at 6 PM.   . Dulaglutide (TRULICITY) 1.5 UD/1.4HF SOPN Inject 0.5 mLs (1.5 mg total) into the skin once a week.   . hydrALAZINE (APRESOLINE) 100 MG tablet Take 1 tablet (100 mg total) by mouth 2 (two) times daily.   . metFORMIN (GLUCOPHAGE) 500 MG tablet Take 2 tablets (1,000 mg total) by mouth 2 (two) times daily with a meal.   . metoprolol succinate (TOPROL-XL) 100 MG 24 hr tablet Take 2  tablets (200 mg total) by mouth in the morning and at bedtime. Take with or immediately following a meal.   . montelukast (SINGULAIR) 10 MG tablet Take 10 mg by mouth daily.   . nortriptyline (PAMELOR) 25 MG capsule Take 2 capsules (50 mg total) by mouth at bedtime.   . Oxycodone HCl 10 MG TABS Take 1 tablet (10 mg total) by mouth every 6 (six) hours as needed. Must last 30 days 07/26/2019: FUTURE Prescription. (NOT a DUPLICATE!!) >>>DO NOT DELETE<<< (even if Expired!) See Care Coordination Note from [facility] (Dr. Dossie Arbour)  . pantoprazole (PROTONIX) 40 MG tablet Take 1 tablet (40 mg total) by mouth daily.   . Potassium Chloride ER 20 MEQ TBCR TAKE 1 TABLET BY MOUTH EVERY DAY   . ARNUITY ELLIPTA 100 MCG/ACT AEPB Inhale 1 puff into the lungs daily.   Marland [name] lidocaine (LIDODERM) 5 % Place 1 patch onto the skin daily. Remove & Discard patch within 12 hours or as directed by MD   . NONFORMULARY OR COMPOUNDED ITEM Apply 1-2 mLs topically 4 (four) times daily as needed. 10% Ketamine/2% Cyclobenzaprine/6% Gabapentin Cream   . Oxycodone HCl 10 MG TABS Take 1 tablet (10 mg total) by mouth every 6 (six) hours as needed. Must last 30 days 07/26/2019: FUTURE Prescription. (NOT a DUPLICATE!!) >>>DO NOT DELETE<<< (even if Expired!) See Care Coordination Note from [facility] (Dr. Dossie Arbour)    . Oxycodone HCl 10 MG TABS Take 1 tablet (10 mg total) by  mouth every 6 (six) hours as needed. Must last 30 days 07/26/2019: FUTURE Prescription. (NOT a DUPLICATE!!) >>>DO NOT DELETE<<< (even if Expired!) See Care Coordination Note from [facility] (Dr. Dossie Arbour)  . SUMAtriptan (IMITREX) 50 MG tablet TAKE 1 TAB AT ONSET OF MIGRAINE. MAY REPEAT IN 2 HOURS IF HEADACHE PERSISTS OR RECURS.    Facility-Administered Encounter Medications as of 09/28/2019  Medication  . 0.9 %  sodium chloride infusion     Objective:   Goals Addressed              This Visit's Progress     Patient Stated   .  PharmD "I  don't like taking medications" (pt-stated)        CARE PLAN ENTRY (see longtitudinal plan of care for additional care plan information)  Current Barriers:  . Financial, social, and community barriers:  o Bronchoscopy for biopsy planned 10/15/19. Patient notes he is more anxious about whether to get the COVID vaccine, and his risks of COVID, than cancer  . Polypharmacy; complex patient with multiple comorbidities including recent lung cancer, tobacco abuse, HTN, migraines o T2DM: uncontrolled, last A1c 8.4%. Metformin 7564 mg BID, Trulicity 1.5 mg weekly (just increased); sugars improved, fastings and evenings generally 120-140s. Notes he can tell the appetite suppressant effect of Trulicity, which is helping combat his snacking while cutting back on cigarettes.  o HTN: amlodipine 5 mg BID, hydralazine 100 mg BID, metoprolol succinate 200 mg BID. Home BP and office SBP at goal <140.  o ASCVD risk reduction: atorvastatin 10 mg daily; LDL <100, though could consider a more stringent goal of <70 given ASCVD risk factors  o Chronic pain: nortriptyline 50 mg QPM; oxycodone IR 5 mg, Q4-6H PRN (taking 3-4 doses daily);  o Tobacco abuse: currently smoking ~ 1/2 ppd. Notes that [place] this [place] and only smoked 5 cigarettes across 2 days. High desire to quit smoking.  Pharmacist Clinical Goal(s):  Marland [name] Over the next 90 days, patient will work with PharmD and provider towards optimized medication management  Interventions: . Comprehensive medication review performed, medication list updated in electronic medical record . Inter-disciplinary care team collaboration (see longitudinal plan of care) . Praised for progress towards goal A1c. Reviewed goal A1c and corresponding fasting and 2 hour post prandial glucoses. Discussed appetite suppressant effect of GLP1s and how the expected increase in appetite suppression w/ increasing to Trulicity 1.5 mg last week will likely help him  avoid snacking, which has been a struggle recently w/ cutting back on smoking.  . Praised for attainment of close to goal BP. Reviewed that quitting smoking will also help BP.  Marland [name] Motivational interviewing regarding tobacco cessation. Discussed setting a quit date; patient declined to set a specific date at this time. Reviewed short term and long term benefits of tobacco cessation . Discussed COVID vaccinations. Patient worried about blood clots, so discussed differences between J&J vs Pfizer/Moderna vaccinations in terms of side effects and efficacy. Discussed that risks of him contracting COVID are expected to be significantly higher than any risks associated w/ vaccination. Patient noted that he is leaning towards getting vaccinated. Encouraged him to contact any member of his medical team for further discussion, if desired.   Patient Self Care Activities:  . Patient will take medications as prescribed  Please see past updates related to this goal by clicking on the "Past Updates" button in the selected goal  Plan:  - Scheduled PharmD f/u in ~8 weeks  Catie Darnelle Maffucci, PharmD, [place] [phone]

## 2019-09-28 NOTE — Patient Instructions (Signed)
Visit Information  Goals Addressed              This Visit's Progress     Patient Stated   .  PharmD "I don't like taking medications" (pt-stated)        CARE PLAN ENTRY (see longtitudinal plan of care for additional care plan information)  Current Barriers:  . Financial, social, and community barriers:  o Bronchoscopy for biopsy planned 10/15/19. Patient notes he is more anxious about whether to get the COVID vaccine, and his risks of COVID, than cancer  . Polypharmacy; complex patient with multiple comorbidities including recent lung cancer, tobacco abuse, HTN, migraines o T2DM: uncontrolled, last A1c 8.4%. Metformin 6644 mg BID, Trulicity 1.5 mg weekly (just increased); sugars improved, fastings and evenings generally 120-140s. Notes he can tell the appetite suppressant effect of Trulicity, which is helping combat his snacking while cutting back on cigarettes.  o HTN: amlodipine 5 mg BID, hydralazine 100 mg BID, metoprolol succinate 200 mg BID. Home BP and office SBP at goal <140.  o ASCVD risk reduction: atorvastatin 10 mg daily; LDL <100, though could consider a more stringent goal of <70 given ASCVD risk factors  o Chronic pain: nortriptyline 50 mg QPM; oxycodone IR 5 mg, Q4-6H PRN (taking 3-4 doses daily);  o Tobacco abuse: currently smoking ~ 1/2 ppd. Notes that he [place] this weekend and only smoked 5 cigarettes across 2 days. High desire to quit smoking.  Pharmacist Clinical Goal(s):  [name] Over the next 90 days, patient will work with PharmD and provider towards optimized medication management  Interventions: . Comprehensive medication review performed, medication list updated in electronic medical record . Inter-disciplinary care team collaboration (see longitudinal plan of care) . Praised for progress towards goal A1c. Reviewed goal A1c and corresponding fasting and 2 hour post prandial glucoses. Discussed appetite suppressant effect of GLP1s and how  the expected increase in appetite suppression w/ increasing to Trulicity 1.5 mg last week will likely help him avoid snacking, which has been a struggle recently w/ cutting back on smoking.  . Praised for attainment of close to goal BP. Reviewed that quitting smoking will also help BP.  [name] Motivational interviewing regarding tobacco cessation. Discussed setting a quit date; patient declined to set a specific date at this time. Reviewed short term and long term benefits of tobacco cessation . Discussed COVID vaccinations. Patient worried about blood clots, so discussed differences between J&J vs Pfizer/Moderna vaccinations in terms of side effects and efficacy. Discussed that risks of him contracting COVID are expected to be significantly higher than any risks associated w/ vaccination. Patient noted that he is leaning towards getting vaccinated. Encouraged him to contact any member of his medical team for further discussion, if desired.   Patient Self Care Activities:  . Patient will take medications as prescribed  Please see past updates related to this goal by clicking on the "Past Updates" button in the selected goal         The patient verbalized understanding of instructions provided today and declined a print copy of patient instruction materials.   Plan:  - Scheduled PharmD f/u in ~8 weeks  Catie Darnelle Maffucci, PharmD, [place] [phone]

## 2019-09-29 ENCOUNTER — Other Ambulatory Visit: Payer: Self-pay | Admitting: Pulmonary Disease

## 2019-09-29 DIAGNOSIS — C3492 Malignant neoplasm of unspecified part of left bronchus or lung: Secondary | ICD-10-CM

## 2019-10-02 DIAGNOSIS — G4733 Obstructive sleep apnea (adult) (pediatric): Secondary | ICD-10-CM | POA: Diagnosis not present

## 2019-10-06 ENCOUNTER — Encounter
Admission: RE | Admit: 2019-10-06 | Discharge: 2019-10-06 | Disposition: A | Discharge status: Home / Self Care | Payer: Medicare Other | Source: Ambulatory Visit | Attending: Pulmonary Disease | Admitting: Pulmonary Disease

## 2019-10-06 ENCOUNTER — Other Ambulatory Visit: Payer: Self-pay

## 2019-10-06 DIAGNOSIS — F17210 Nicotine dependence, cigarettes, uncomplicated: Secondary | ICD-10-CM | POA: Insufficient documentation

## 2019-10-06 DIAGNOSIS — Z794 Long term (current) use of insulin: Secondary | ICD-10-CM | POA: Insufficient documentation

## 2019-10-06 DIAGNOSIS — Z79899 Other long term (current) drug therapy: Secondary | ICD-10-CM | POA: Diagnosis not present

## 2019-10-06 DIAGNOSIS — E118 Type 2 diabetes mellitus with unspecified complications: Secondary | ICD-10-CM | POA: Insufficient documentation

## 2019-10-06 DIAGNOSIS — E785 Hyperlipidemia, unspecified: Secondary | ICD-10-CM | POA: Insufficient documentation

## 2019-10-06 DIAGNOSIS — K219 Gastro-esophageal reflux disease without esophagitis: Secondary | ICD-10-CM | POA: Insufficient documentation

## 2019-10-06 DIAGNOSIS — J449 Chronic obstructive pulmonary disease, unspecified: Secondary | ICD-10-CM | POA: Insufficient documentation

## 2019-10-06 DIAGNOSIS — Z01818 Encounter for other preprocedural examination: Secondary | ICD-10-CM | POA: Insufficient documentation

## 2019-10-06 DIAGNOSIS — R590 Localized enlarged lymph nodes: Secondary | ICD-10-CM | POA: Diagnosis not present

## 2019-10-06 DIAGNOSIS — Z7901 Long term (current) use of anticoagulants: Secondary | ICD-10-CM | POA: Diagnosis not present

## 2019-10-06 DIAGNOSIS — Z01810 Encounter for preprocedural cardiovascular examination: Secondary | ICD-10-CM | POA: Diagnosis not present

## 2019-10-06 DIAGNOSIS — I10 Essential (primary) hypertension: Secondary | ICD-10-CM | POA: Insufficient documentation

## 2019-10-06 LAB — CBC
HCT: 36.7 % — ABNORMAL LOW (ref 39.0–52.0)
Hemoglobin: 12.4 g/dL — ABNORMAL LOW (ref 13.0–17.0)
MCH: 30.2 pg (ref 26.0–34.0)
MCHC: 33.8 g/dL (ref 30.0–36.0)
MCV: 89.3 fL (ref 80.0–100.0)
Platelets: 182 10*3/uL (ref 150–400)
RBC: 4.11 MIL/uL — ABNORMAL LOW (ref 4.22–5.81)
RDW: 13 % (ref 11.5–15.5)
WBC: 6.6 10*3/uL (ref 4.0–10.5)
nRBC: 0 % (ref 0.0–0.2)

## 2019-10-06 LAB — PROTIME-INR
INR: 1.1 (ref 0.8–1.2)
Prothrombin Time: 13.8 seconds (ref 11.4–15.2)

## 2019-10-06 LAB — APTT: aPTT: 29 seconds (ref 24–36)

## 2019-10-06 NOTE — Patient Instructions (Addendum)
Your procedure is scheduled on: Friday 10/15/19.  Report to DAY SURGERY DEPARTMENT LOCATED ON 2ND FLOOR MEDICAL MALL ENTRANCE. To find out your arrival time please call [phone] between 1PM - 3PM on Thursday 10/14/19.   Remember: Instructions that are not followed completely may result in serious medical risk, up to and including death, or upon the discretion of your surgeon and anesthesiologist your surgery may need to be rescheduled.     __X__ 1. Do not eat food after midnight the night before your procedure.                 No gum chewing or hard candies. You may drink SUGAR FREE clear liquids up to 2 hours                 before you are scheduled to arrive for your surgery- DO NOT drink clear                 liquids within 2 hours of the start of your surgery.                   __X__2.  On the morning of surgery brush your teeth with toothpaste and water, you may rinse your mouth with mouthwash if you wish.  Do not swallow any toothpaste or mouthwash.    __X__ 3.  No Alcohol for 24 hours before or after surgery.  __X__ 4.  Do Not Smoke or use e-cigarettes For 24 Hours Prior to Your Surgery.                 Do not use any chewable tobacco products for at least 6 hours prior to                 surgery.  __X__5.  Notify your doctor if there is any change in your medical condition      (cold, fever, infections).      Do NOT wear jewelry, make-up, hairpins, clips or nail polish. Do NOT wear lotions, powders, or perfumes.  Do NOT shave 48 hours prior to surgery. Men may shave face and neck. Do NOT bring valuables to the hospital.     [facility] is not responsible for any belongings or valuables.   Contacts, dentures/partials or body piercings may not be worn into surgery. Bring a case for your contacts, glasses or hearing aids, a denture cup will be supplied.    For patients admitted to the hospital, discharge time is determined by your treatment team.    Patients  discharged the day of surgery will not be allowed to drive home.     __X__ Take these medicines the morning of surgery with A SIP OF WATER:     1. amLODipine (NORVASC)   2. ARNUITY ELLIPTA   3. hydrALAZINE (APRESOLINE)   4. metoprolol succinate (TOPROL-XL)   5. montelukast (SINGULAIR)   6. pantoprazole (PROTONIX)  7. Oxycodone if needed     __X__ Use CHG Soap as directed  __X__ Use inhalers on the day of surgery. Also bring the inhaler with you to the hospital on the morning of surgery.  __X__ Stop Metformin 2 days prior to surgery. Your last dose will be on Tuesday evening 10/12/19.     __X__ Stop Anti-inflammatories 7 days before surgery such as Advil, Ibuprofen, Motrin, BC or Goodies Powder, Naprosyn, Naproxen, Aleve, Aspirin, Meloxicam. May take Tylenol if needed for pain or discomfort.   __X__Do not start taking any new herbal  supplements or vitamins prior to your procedure.     Wear comfortable clothing (specific to your surgery type) to the hospital.  Plan for stool softeners for home use; pain medications have a tendency to cause constipation. You can also help prevent constipation by eating foods high in fiber such as fruits and vegetables and drinking plenty of fluids as your diet allows.  After surgery, you can prevent lung complications by doing breathing exercises.Take deep breaths and cough every 1-2 hours. Your doctor may order a device called an Incentive Spirometer to help you take deep breaths.  Please call the [place] Department at [phone] if you have any questions about these instructions

## 2019-10-06 NOTE — Progress Notes (Signed)
[facility] Perioperative Services  Pre-Admission/Anesthesia Testing Clinical Review  Date: 10/06/19  Patient Demographics:  Name: Kristopher [name] DOB:   11-26-1960 MRN:   [id number]  Planned Surgical Procedure(s):    Case: [id number] Date/Time: 10/15/19 1245   Procedures:      VIDEO BRONCHOSCOPY WITH ENDOBRONCHIAL ULTRASOUND (N/A )     VIDEO BRONCHOSCOPY WITH ENDOBRONCHIAL NAVIGATION (N/A )   Anesthesia type: General   Pre-op diagnosis: Mediastinal Lymphadenopathy R59.0   Location: [facility] PROCEDURE RM 02 / [facility] ORS FOR ANESTHESIA GROUP   Surgeons: Ottie Glazier, MD     NOTE: Available PAT nursing documentation and vital signs have been reviewed. Clinical nursing staff has updated patient's PMH/PSHx, current medication list, and drug allergies/intolerances to ensure comprehensive history available to assist in medical decision making as it pertains to the aforementioned surgical procedure and anticipated anesthetic course.   Clinical Discussion:  Kristopher [name] is a 59 y.o. male who is submitted for pre-surgical anesthesia review and clearance prior to him undergoing the above procedure. Patient is a Current Smoker (1.75 pack years). Pertinent PMH includes: aortic atherosclerosis, postoperative atrial fibrillation, HTN, HLD, T2DM, OSAH (requires nocturnal PAP therapy), COPD, dyspnea, squamous cell carcinoma of LEFT lung (s/p LLL lobectomy), GERD (on daily PPI), OA, chronic pain syndrome, radicular cervical and lumbar spine pain secondary to disc herniation (previous lumbar fusion), chronic SI joint pain, chronic pain syndrome, chronic opioid dependence.  Patient was [facility] to [facility] on 01/14/2019 at which time he underwent a LEFT thyroid thoracotomy to resect an enlarging LEFT lower lobe lung mass for biopsy proven NSCLC.  Postoperative course was complicated by an episode of atrial fibrillation with RVR, of which patient had no prior history. BNP and hs-TnI were  normal.  Patient was removed with intravenous amiodarone bolus and metoprolol which controlled his rate, however a recurrent episode resulted in treatment with an amiodarone drip.  Patient eventually transitioned to oral amiodarone and maintaining a sinus rhythm.  TTE revealed a normal LVEF of 55-60% with moderate LVH.  Patient was discharged home on oral amiodarone.  Cardiology unlikely per anticoagulation as the cause of the postoperative atrial fibrillation was felt to be reversible.  Patient is followed by outpatient cardiology Saralyn Pilar, MD). He was last seen in the cardiology clinic on 06/30/2019; notes reviewed.  At that time, patient reported that he was doing well.  Patient denied any angina/anginal equivalents.  Patient with chronic exertional dyspnea; breathing at baseline.  Patient denies orthopnea, PND, palpitation, presyncope/syncope, and peripheral edema.  Amiodarone dose reduced to 200 mg daily; metoprolol tartrate continued at 50 mg twice daily.  Event monitor study in 02/2019 revealed a predominantly sinus rhythm; no atrial fibrillation. HTN well controlled. Last Lexiscan in 2019 revealed no evidence of ischemia. Patient reported fatigue EDS, and snoring at night. Subsequent PSG revealed (+) OSAH requiring nocturnal PAP therapy; report not available for review to determine AHI. Cardiology issued presurgical cardiac clearance on 03/24/2019 with a LOW risk stratification. Patient is not on daily anticoagulation/antiplatelet therapy. Dr. Saralyn Pilar has requested that patient continue beta-blocker metoprolol) during his pre/peri/post-operative course.   He denies previous intra-operative complications with anesthesia. He underwent a general anesthetic course here (ASA IV) in 01/2019 with no documented complications during the procedure. Note aforementioned post-operative A.fib with RVR.   Vitals with BMI 10/06/2019 09/22/2019 08/25/2019  Height _0  - 5' 8.11"  Weight 241 lbs 9 oz 241 lbs 10 oz  237 lbs 10 oz  BMI 34.66 36.62 36.01  Systolic 923 300 762  Diastolic 91 93 82  Pulse 76 75 71    Providers/Specialists:   NOTE: Primary physician provider listed below. Patient may have been seen by APP or partner within same practice.   PROVIDER ROLE LAST Charlynn Grimes, MD Pulmonology (Surgeon) 09/20/2019  [name], DO Primary Care Provider 09/22/2019  Isaias Cowman, MD Cardiology 06/30/2019   Allergies:  Acetaminophen, Aspirin, Epinephrine, Novocain [procaine], Penicillins, Strawberry extract, and Shellfish allergy  Current Home Medications:   . amLODipine (NORVASC) 5 MG tablet  . ARNUITY ELLIPTA 100 MCG/ACT AEPB  . atorvastatin (LIPITOR) 10 MG tablet  . Dulaglutide (TRULICITY) 1.5 UQ/3.3HL SOPN  . hydrALAZINE (APRESOLINE) 100 MG tablet  . lidocaine (LIDODERM) 5 %  . metFORMIN (GLUCOPHAGE) 500 MG tablet  . metoprolol succinate (TOPROL-XL) 100 MG 24 hr tablet  . montelukast (SINGULAIR) 10 MG tablet  . NONFORMULARY OR COMPOUNDED ITEM  . nortriptyline (PAMELOR) 25 MG capsule  . Oxycodone HCl 10 MG TABS  . pantoprazole (PROTONIX) 40 MG tablet  . Potassium Chloride ER 20 MEQ TBCR  . SUMAtriptan (IMITREX) 50 MG tablet  . Accu-Chek FastClix Lancets MISC  . ACCU-CHEK GUIDE test strip  . Oxycodone HCl 10 MG TABS  . Oxycodone HCl 10 MG TABS   No current facility-administered medications for this encounter.   [name] 0.9 %  sodium chloride infusion   History:   Past Medical History:  Diagnosis Date  . Allergy   . Arthritis    left foot  . Benign hypertensive kidney disease   . Chronic back pain    Four rods in back  . Diabetes mellitus, type 2 ([place])   . Dyspnea   . GERD (gastroesophageal reflux disease)   . Hypertension   . Malignant neoplasm of lung ([place])   . Migraines    daily   Past Surgical History:  Procedure Laterality Date  . APPENDECTOMY    . BACK SURGERY    . COLONOSCOPY WITH PROPOFOL N/A 02/19/2016   Procedure: COLONOSCOPY WITH  PROPOFOL;  Surgeon: Lucilla Lame, MD;  Location: [place];  Service: Endoscopy;  Laterality: N/A;  . COLONOSCOPY WITH PROPOFOL N/A 01/19/2018   Procedure: COLONOSCOPY WITH PROPOFOL;  Surgeon: Lucilla Lame, MD;  Location: [place];  Service: Endoscopy;  Laterality: N/A;  Diabetic - oral meds  . DG OPERATIVE LEFT HIP ([place] HX)     10/19  . ELECTROMAGNETIC NAVIGATION BROCHOSCOPY Left 11/18/2018   Procedure: ELECTROMAGNETIC NAVIGATION BRONCHOSCOPY;  Surgeon: Tyler Pita, MD;  Location: [facility] ORS;  Service: Cardiopulmonary;  Laterality: Left;  . FLEXIBLE BRONCHOSCOPY Bilateral 01/20/2019   Procedure: FLEXIBLE BRONCHOSCOPY;  Surgeon: Ottie Glazier, MD;  Location: [facility];  Service: Thoracic;  Laterality: Bilateral;  . FLEXIBLE BRONCHOSCOPY Bilateral 01/22/2019   Procedure: FLEXIBLE BRONCHOSCOPY;  Surgeon: Ottie Glazier, MD;  Location: [facility];  Service: Thoracic;  Laterality: Bilateral;  . FOOT SURGERY Left    Screws and plates  . JOINT REPLACEMENT Left 12/2017   DR Rudene Christians Hip  . KNEE SURGERY Left    X 2  . LEG SURGERY    . LUNG CANCER SURGERY    . POLYPECTOMY N/A 02/19/2016   Procedure: POLYPECTOMY;  Surgeon: Lucilla Lame, MD;  Location: [place];  Service: Endoscopy;  Laterality: N/A;  . POLYPECTOMY  01/19/2018   Procedure: POLYPECTOMY;  Surgeon: Lucilla Lame, MD;  Location: [place];  Service: Endoscopy;;  . THORACOTOMY Left 01/14/2019   Procedure: THORACOTOMY MAJOR, LEFT;  Surgeon:  Nestor Lewandowsky, MD;  Location: [facility];  Service: General;  Laterality: Left;  . TOTAL HIP ARTHROPLASTY Left 12/02/2017   Procedure: TOTAL HIP ARTHROPLASTY ANTERIOR APPROACH;  Surgeon: Hessie Knows, MD;  Location: [facility];  Service: Orthopedics;  Laterality: Left;  [name] VIDEO BRONCHOSCOPY Left 01/14/2019   Procedure: VIDEO BRONCHOSCOPY WITH FLUORO, LEFT;  Surgeon: Nestor Lewandowsky, MD;  Location: [facility];  Service: General;  Laterality: Left;   Family History    Problem Relation Age of Onset  . Cancer Father   . Diabetes Sister   . Thrombosis Sister    Social History   Tobacco Use  . Smoking status: Current Some Day Smoker    Packs/day: 0.05    Years: 35.00    Pack years: 1.75    Types: Cigarettes  . Smokeless tobacco: Never Used  . Tobacco comment: 1 pack last 3 days   Vaping Use  . Vaping Use: Never used  Substance Use Topics  . Alcohol use: No    Alcohol/week: 0.0 standard drinks  . Drug use: Yes    Types: Oxycodone    Comment: prescribed    Pertinent Clinical Results:  LABS: Labs reviewed: Acceptable for surgery.  Hospital Outpatient Visit on 10/06/2019  Component Date Value Ref Range Status  . aPTT 10/06/2019 29  24 - 36 seconds Final   Performed at [facility], [address] [place], [place] [address]  . WBC 10/06/2019 6.6  4.0 - 10.5 K/uL Final  . RBC 10/06/2019 4.11* 4.22 - 5.81 MIL/uL Final  . Hemoglobin 10/06/2019 12.4* 13.0 - 17.0 g/dL Final  . HCT 10/06/2019 36.7* 39 - 52 % Final  . MCV 10/06/2019 89.3  80.0 - 100.0 fL Final  . MCH 10/06/2019 30.2  26.0 - 34.0 pg Final  . MCHC 10/06/2019 33.8  30.0 - 36.0 g/dL Final  . RDW 10/06/2019 13.0  11.5 - 15.5 % Final  . Platelets 10/06/2019 182  150 - 400 K/uL Final  . nRBC 10/06/2019 0.0  0.0 - 0.2 % Final   Performed at [facility], [address], [place], [place] [address]  . Prothrombin Time 10/06/2019 13.8  11.4 - 15.2 seconds Final  . INR 10/06/2019 1.1  0.8 - 1.2 Final   Comment: (NOTE) INR goal varies based on device and disease states. Performed at [facility], [address] [place], [place] [address]     ECG: Date: 10/06/2019 Time ECG obtained: 1254 PM Rate: 73 bpm Rhythm: normal sinus Axis (leads I and aVF): Normal Intervals: PR 174 ms. QTc 460 ms. ST segment and T wave changes: No evidence of ST segment elevation or depression Comparison: Similar to previous tracing obtained on 01/24/2019.   IMAGING /  PROCEDURES: ECHOCARDIOGRAM done on 01/16/2019 1. Left ventricular ejection fraction, by visual estimation, is 55 to 60%. The left ventricle has hyperdynamic function. Left ventricular septal  wall thickness was moderately increased. Moderately increased left ventricular posterior wall thickness. There is moderately increased left ventricular hypertrophy.  2. Left ventricular diastolic parameters are consistent with Grade I diastolic dysfunction (impaired relaxation).  3. Global right ventricle was not well visualized.The right ventricular size is not well visualized. Right ventricular wall thickness was not  Assessed. 4. Left atrial size was mildly dilated.  5. Right atrial size was normal.  6. The mitral valve was not well visualized. Trace mitral valve regurgitation.  7. The tricuspid valve is not well visualized. Tricuspid valve regurgitation is mild.  8. The aortic valve  was not well visualized. Aortic valve regurgitation is trivial.  9. The pulmonic valve was not assessed. Pulmonic valve regurgitation is not visualized.  10. The interatrial septum was not assessed.   RESTAGING PET SCAN SKULL BASE TO THIGH done on 08/23/2019 1. Mediastinal nodal enlargement, mild but with increased metabolic activity suspicious for nodal metastases. 2. Small pulmonary nodules better characterized on recent chest CT, small nodule along the superior aspect of the major fissure in the RIGHT chest is unchanged.  LEXISCAN done on 05/08/2017 1. Pharmacological myocardial perfusion imaging study with no significant  ischemia 2. Normal wall motion 3. EF estimated at 37% (ejection fraction likely depressed secondary to GI uptake artifact) 4. No EKG changes concerning for ischemia at peak stress or in recovery. 5. Low risk scan  LUMBAR SPINE DIAGNOSTIC RADIOGRAPHS done on 06/03/2017 1. Anterior fusion L5-S1 unchanged.  2. Mild lumbar degenerative changes. 3. No acute abnormality.  Impression and Plan:    Kristopher [name] has been referred for pre-anesthesia review and clearance prior him undergoing the planned anesthetic and procedural courses. Available labs, pertinent testing, and imaging results were personally reviewed by me. This patient has been appropriately cleared by cardiology.   Based on clinical review performed today (10/06/19), barring and significant acute changes in the patient's overall condition, it is anticipated that he will be able to proceed with the planned surgical intervention. Any acute changes in clinical condition may necessitate his procedure being postponed and/or cancelled. Pre-surgical instructions were reviewed with the patient during his PAT appointment and questions were fielded by PAT clinical staff.  Honor Loh, MSN, APRN, FNP-C, CEN [facility]  Peri-operative Services Nurse Practitioner Phone: [phone] 10/06/19 3:15 PM  NOTE: This note has been prepared using Dragon dictation software. Despite my best ability to proofread, there is always the potential that unintentional transcriptional errors may still occur from this process.

## 2019-10-07 ENCOUNTER — Telehealth: Payer: Self-pay | Admitting: Pain Medicine

## 2019-10-07 NOTE — Telephone Encounter (Signed)
Patient lvmail asking about the remainder of his medications. He only received 87 of 120 and will need script for the rest.

## 2019-10-08 ENCOUNTER — Ambulatory Visit: Admission: RE | Admit: 2019-10-08 | Payer: Medicare Other | Source: Ambulatory Visit

## 2019-10-08 NOTE — Telephone Encounter (Signed)
Please schedule a vv as soon as possible.

## 2019-10-08 NOTE — Telephone Encounter (Signed)
schedule him a virtual visit as soon as possible

## 2019-10-08 NOTE — Telephone Encounter (Signed)
Scheduled 10-13-19 MM F2F, due to pharmacy only filling half medications

## 2019-10-13 ENCOUNTER — Other Ambulatory Visit: Payer: Self-pay

## 2019-10-13 ENCOUNTER — Other Ambulatory Visit
Admission: RE | Admit: 2019-10-13 | Discharge: 2019-10-13 | Disposition: A | Discharge status: Home / Self Care | Payer: Medicare Other | Source: Ambulatory Visit | Attending: Pulmonary Disease | Admitting: Pulmonary Disease

## 2019-10-13 ENCOUNTER — Encounter: Payer: Self-pay | Admitting: Pain Medicine

## 2019-10-13 ENCOUNTER — Ambulatory Visit (HOSPITAL_BASED_OUTPATIENT_CLINIC_OR_DEPARTMENT_OTHER): Payer: Medicare Other | Admitting: Pain Medicine

## 2019-10-13 VITALS — BP 137/89 | HR 82 | Temp 97.5°F | Resp 16 | Ht 70.0 in | Wt 220.0 lb

## 2019-10-13 DIAGNOSIS — Z9049 Acquired absence of other specified parts of digestive tract: Secondary | ICD-10-CM | POA: Insufficient documentation

## 2019-10-13 DIAGNOSIS — I129 Hypertensive chronic kidney disease with stage 1 through stage 4 chronic kidney disease, or unspecified chronic kidney disease: Secondary | ICD-10-CM | POA: Insufficient documentation

## 2019-10-13 DIAGNOSIS — Z96642 Presence of left artificial hip joint: Secondary | ICD-10-CM | POA: Insufficient documentation

## 2019-10-13 DIAGNOSIS — Z886 Allergy status to analgesic agent status: Secondary | ICD-10-CM | POA: Insufficient documentation

## 2019-10-13 DIAGNOSIS — M79605 Pain in left leg: Secondary | ICD-10-CM | POA: Diagnosis not present

## 2019-10-13 DIAGNOSIS — M5416 Radiculopathy, lumbar region: Secondary | ICD-10-CM

## 2019-10-13 DIAGNOSIS — G43909 Migraine, unspecified, not intractable, without status migrainosus: Secondary | ICD-10-CM | POA: Insufficient documentation

## 2019-10-13 DIAGNOSIS — R590 Localized enlarged lymph nodes: Secondary | ICD-10-CM | POA: Diagnosis not present

## 2019-10-13 DIAGNOSIS — C3490 Malignant neoplasm of unspecified part of unspecified bronchus or lung: Secondary | ICD-10-CM | POA: Diagnosis not present

## 2019-10-13 DIAGNOSIS — M4802 Spinal stenosis, cervical region: Secondary | ICD-10-CM | POA: Diagnosis not present

## 2019-10-13 DIAGNOSIS — F17210 Nicotine dependence, cigarettes, uncomplicated: Secondary | ICD-10-CM | POA: Insufficient documentation

## 2019-10-13 DIAGNOSIS — E1122 Type 2 diabetes mellitus with diabetic chronic kidney disease: Secondary | ICD-10-CM | POA: Diagnosis not present

## 2019-10-13 DIAGNOSIS — Z7984 Long term (current) use of oral hypoglycemic drugs: Secondary | ICD-10-CM | POA: Insufficient documentation

## 2019-10-13 DIAGNOSIS — Z20822 Contact with and (suspected) exposure to covid-19: Secondary | ICD-10-CM | POA: Diagnosis not present

## 2019-10-13 DIAGNOSIS — M79601 Pain in right arm: Secondary | ICD-10-CM | POA: Diagnosis not present

## 2019-10-13 DIAGNOSIS — M5441 Lumbago with sciatica, right side: Secondary | ICD-10-CM

## 2019-10-13 DIAGNOSIS — M79602 Pain in left arm: Secondary | ICD-10-CM | POA: Diagnosis not present

## 2019-10-13 DIAGNOSIS — M7918 Myalgia, other site: Secondary | ICD-10-CM | POA: Diagnosis not present

## 2019-10-13 DIAGNOSIS — K219 Gastro-esophageal reflux disease without esophagitis: Secondary | ICD-10-CM | POA: Insufficient documentation

## 2019-10-13 DIAGNOSIS — G894 Chronic pain syndrome: Secondary | ICD-10-CM | POA: Diagnosis not present

## 2019-10-13 DIAGNOSIS — M4726 Other spondylosis with radiculopathy, lumbar region: Secondary | ICD-10-CM | POA: Insufficient documentation

## 2019-10-13 DIAGNOSIS — R918 Other nonspecific abnormal finding of lung field: Secondary | ICD-10-CM | POA: Diagnosis not present

## 2019-10-13 DIAGNOSIS — M4317 Spondylolisthesis, lumbosacral region: Secondary | ICD-10-CM | POA: Insufficient documentation

## 2019-10-13 DIAGNOSIS — R531 Weakness: Secondary | ICD-10-CM | POA: Insufficient documentation

## 2019-10-13 DIAGNOSIS — M25552 Pain in left hip: Secondary | ICD-10-CM | POA: Insufficient documentation

## 2019-10-13 DIAGNOSIS — N189 Chronic kidney disease, unspecified: Secondary | ICD-10-CM | POA: Diagnosis not present

## 2019-10-13 DIAGNOSIS — G8929 Other chronic pain: Secondary | ICD-10-CM

## 2019-10-13 DIAGNOSIS — Z79899 Other long term (current) drug therapy: Secondary | ICD-10-CM | POA: Diagnosis not present

## 2019-10-13 DIAGNOSIS — M5116 Intervertebral disc disorders with radiculopathy, lumbar region: Secondary | ICD-10-CM | POA: Insufficient documentation

## 2019-10-13 DIAGNOSIS — Z880 Allergy status to penicillin: Secondary | ICD-10-CM | POA: Insufficient documentation

## 2019-10-13 DIAGNOSIS — Z885 Allergy status to narcotic agent status: Secondary | ICD-10-CM | POA: Insufficient documentation

## 2019-10-13 DIAGNOSIS — M5442 Lumbago with sciatica, left side: Secondary | ICD-10-CM | POA: Diagnosis not present

## 2019-10-13 DIAGNOSIS — Z79891 Long term (current) use of opiate analgesic: Secondary | ICD-10-CM | POA: Insufficient documentation

## 2019-10-13 DIAGNOSIS — M4722 Other spondylosis with radiculopathy, cervical region: Secondary | ICD-10-CM | POA: Diagnosis not present

## 2019-10-13 DIAGNOSIS — M533 Sacrococcygeal disorders, not elsewhere classified: Secondary | ICD-10-CM | POA: Insufficient documentation

## 2019-10-13 DIAGNOSIS — F1120 Opioid dependence, uncomplicated: Secondary | ICD-10-CM

## 2019-10-13 LAB — SARS CORONAVIRUS 2 (TAT 6-24 HRS): SARS Coronavirus 2: NEGATIVE

## 2019-10-13 MED ORDER — NONFORMULARY OR COMPOUNDED ITEM: 1.0000 mL | Freq: Four times a day (QID) | 6 refills | Status: DC | PRN

## 2019-10-13 MED ORDER — OXYCODONE HCL 10 MG PO TABS: 10.0000 mg | ORAL_TABLET | Freq: Four times a day (QID) | ORAL | 0 refills | Status: DC | PRN

## 2019-10-13 NOTE — Progress Notes (Signed)
Nursing Pain Medication Assessment:  Safety precautions to be maintained throughout the outpatient stay will include: orient to surroundings, keep bed in low position, maintain call bell within reach at all times, provide assistance with transfer out of bed and ambulation.  Medication Inspection Compliance: Pill count conducted under aseptic conditions, in front of the patient. Neither the pills nor the bottle was removed from the patient's sight at any time. Once count was completed pills were immediately returned to the patient in their original bottle.  Medication: Oxycodone ER (OxyContin) Pill/Patch Count: 10 of 87 pills remain Pill/Patch Appearance: Markings consistent with prescribed medication Bottle Appearance: Standard pharmacy container. Clearly labeled. Filled Date: 7 / 23 / 2021 Last Medication intake:  Today

## 2019-10-13 NOTE — Progress Notes (Signed)
PROVIDER NOTE: Information contained herein reflects review and annotations entered in association with encounter. Interpretation of such information and data should be left to medically-trained personnel. Information provided to patient can be located elsewhere in the medical record under "Patient Instructions". Document created using STT-dictation technology, any transcriptional errors that may result from process are unintentional.    Patient: Kristopher [name]  Service Category: E/M  Provider: Gaspar Cola, MD  DOB: October 03, 1960  DOS: 10/13/2019  Specialty: Interventional Pain Management  MRN: [id number]  Setting: Ambulatory outpatient  PCP: [name], DO  Type: Established Patient    Referring Provider: [name], DO  Location: Office  Delivery: Face-to-face     HPI  Reason for encounter: Kristopher [name], a 60 y.o. year old male, is here today for evaluation and management of his Chronic pain syndrome [G89.4]. Kristopher [name] primary complain today is Back Pain (lower) Last encounter: Practice (10/07/2019). My last encounter with him was on 10/07/2019. Pertinent problems: 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); Chronic neck pain (posterior midline) (Bilateral) (L>R); Cervical spondylosis; Chronic cervical radicular pain (Third area of Pain) (Bilateral) (C5/C6 dermatome) (L>R); Cervical spinal stenosis (C4-5); Cervical foraminal stenosis (Bilateral C5-6); Retrolisthesis of L5-S1; Cervical disc herniation (C4-5 and C5-6); Lumbar disc herniation (L5-S1); Chronic sacroiliac joint pain (Bilateral) (L>R); Chronic hip pain (Left); Lumbar facet syndrome (Location of Primary Source of Pain) (Bilateral) (L>R); Chronic lower extremity pain (Secondary area of Pain) (Left); Greater occipital  neuralgia (Right); Chronic pain syndrome; Migraine; Musculoskeletal pain, chronic; Left-sided weakness; Hip arthritis; S/P hip replacement; Squamous cell carcinoma of left lung (HCC); and Chronic upper extremity pain (Third area of Pain) (Bilateral) (L>R) on their pertinent problem list. Pain Assessment: Severity of Chronic pain is reported as a 3 /10. Location: Back Left/radiates to back of leg and moves around to the front of klnee to lower calve. Onset: More than a month ago. Quality: Aching, Burning, Constant, Pounding. Timing: Constant. Modifying factor(s): medications, rest. Vitals:  height is 5' 10" (1.778 m) and weight is 220 lb (99.8 kg). His temperature is 97.5 F (36.4 C) (abnormal). His blood pressure is 137/89 and his pulse is 82. His respiration is 16 and oxygen saturation is 99%.    The patient indicates doing well with the current medication regimen. No adverse reactions or side effects reported to the medications.  The patient refers that on January 14, 2019 he had a left lung partial resection to remove a lung cancer.  Today he was in the area of the hospital to get a Covid 19 test done for the purpose of being clear so that he can start chemotherapy/radiation therapy.  (He is not sure which).  PMP compliant.  Today I will be giving him a single prescription for oxycodone IR 10 mg to be taken 1 tablet p.o. every 6 hours.  He has been made aware of the shortage of the medication.  He should have enough medicine to last until 11/23/2019.  We will see him before then to review the UDS and if everything is okay, then we will provide him with enough medication to last for 90 days.  After that, we will probably transfer his care to Dr. Holley Raring.  Today we asked patient about procedures but he declined it.  Pharmacotherapy Assessment   Analgesic: Oxycodone IR 10 mg,  1 tab PO q 6 hrs (40 mg/day of oxycodone) + Compounded Specialty Analgesic Cream (w/ Ketamine) MME/day:60 mg/day.    Monitoring: [place] PMP: PDMP reviewed during this encounter.       Pharmacotherapy: No side-effects or adverse reactions reported. Compliance: No problems identified. Effectiveness: Clinically acceptable.  Ignatius Specking, RN  10/13/2019 11:14 AM  Sign when Signing Visit Nursing Pain Medication Assessment:  Safety precautions to be maintained throughout the outpatient stay will include: orient to surroundings, keep bed in low position, maintain call bell within reach at all times, provide assistance with transfer out of bed and ambulation.  Medication Inspection Compliance: Pill count conducted under aseptic conditions, in front of the patient. Neither the pills nor the bottle was removed from the patient's sight at any time. Once count was completed pills were immediately returned to the patient in their original bottle.  Medication: Oxycodone ER (OxyContin) Pill/Patch Count: 10 of 87 pills remain Pill/Patch Appearance: Markings consistent with prescribed medication Bottle Appearance: Standard pharmacy container. Clearly labeled. Filled Date: 7 / 23 / 2021 Last Medication intake:  Today    UDS:  Summary  Date Value Ref Range Status  01/20/2018 FINAL  Final    Comment:    ==================================================================== TOXASSURE SELECT 13 (MW) ==================================================================== Test                             Result       Flag       Units Drug Present   Oxycodone                      1855                    ng/mg creat   Oxymorphone                    685                     ng/mg creat   Noroxycodone                   1293                    ng/mg creat   Noroxymorphone                 103                     ng/mg creat    Sources of oxycodone are scheduled prescription medications.    Oxymorphone, noroxycodone, and noroxymorphone are expected    metabolites of oxycodone. Oxymorphone is also available as a    scheduled  prescription medication. ==================================================================== Test                      Result    Flag   Units      Ref Range   Creatinine              96               mg/dL      >=20 ==================================================================== Declared Medications:  Medication list was not provided. ==================================================================== For clinical consultation, please call [phone]. ====================================================================      ROS  Constitutional: Denies any fever or chills Gastrointestinal: No reported hemesis, hematochezia, vomiting, or acute GI distress Musculoskeletal: Denies any acute onset  joint swelling, redness, loss of ROM, or weakness Neurological: No reported episodes of acute onset apraxia, aphasia, dysarthria, agnosia, amnesia, paralysis, loss of coordination, or loss of consciousness  Medication Review  Accu-Chek FastClix Lancets, Dulaglutide, Fluticasone Furoate, NONFORMULARY OR COMPOUNDED ITEM, Oxycodone HCl, Potassium Chloride ER, SUMAtriptan, amLODipine, atorvastatin, glucose blood, hydrALAZINE, lidocaine, metFORMIN, metoprolol succinate, montelukast, nortriptyline, and pantoprazole  History Review  Allergy: Kristopher [name] is allergic to acetaminophen, aspirin, epinephrine, novocain [procaine], penicillins, strawberry extract, and shellfish allergy. Drug: Kristopher [name]  reports current drug use. Drug: Oxycodone. Alcohol:  reports no history of alcohol use. Tobacco:  reports that he has been smoking cigarettes. He has a 1.75 pack-year smoking history. He has never used smokeless tobacco. Social: Kristopher [name]  reports that he has been smoking cigarettes. He has a 1.75 pack-year smoking history. He has never used smokeless tobacco. He reports current drug use. Drug: Oxycodone. He reports that he does not drink alcohol. Medical:  has a past medical history of Allergy,  Arthritis, Benign hypertensive kidney disease, Chronic back pain, Diabetes mellitus, type 2 ([place]), Dyspnea, GERD (gastroesophageal reflux disease), Hypertension, Malignant neoplasm of lung ([place]), and Migraines. Surgical: Kristopher [name]  has a past surgical history that includes Back surgery; Appendectomy; Leg Surgery; Knee surgery (Left); Foot surgery (Left); Colonoscopy with propofol (N/A, 02/19/2016); polypectomy (N/A, 02/19/2016); Total hip arthroplasty (Left, 12/02/2017); Colonoscopy with propofol (N/A, 01/19/2018); polypectomy (01/19/2018); DG OPERATIVE LEFT HIP ([place] [facility]); Joint replacement (Left, 12/2017); Video bronchoscopy (Left, 01/14/2019); Thoracotomy (Left, 01/14/2019); Flexible bronchoscopy (Bilateral, 01/20/2019); Flexible bronchoscopy (Bilateral, 01/22/2019); Electormagnetic navigation bronchoscopy (Left, 11/18/2018); and Lung cancer surgery. Family: family history includes Cancer in his father; Diabetes in his sister; Thrombosis in his sister.  Laboratory Chemistry Profile   Renal Lab Results  Component Value Date   BUN 9 08/25/2019   CREATININE 0.82 08/25/2019   BCR 11 08/25/2019   GFRAA 113 08/25/2019   GFRNONAA 98 08/25/2019     Hepatic Lab Results  Component Value Date   AST 43 (H) 04/27/2019   ALT 69 (H) 04/27/2019   ALBUMIN 4.5 04/27/2019   ALKPHOS 129 (H) 04/27/2019   LIPASE 50 02/17/2019     Electrolytes Lab Results  Component Value Date   NA 139 08/25/2019   K 3.8 08/25/2019   CL 99 08/25/2019   CALCIUM 9.5 08/25/2019   MG 2.4 01/25/2019   PHOS 3.5 01/23/2019     Bone No results found for: VD25OH, VD125OH2TOT, EX5170YF7, CB4496PR9, 25OHVITD1, 25OHVITD2, 25OHVITD3, TESTOFREE, TESTOSTERONE   Inflammation (CRP: Acute Phase) (ESR: Chronic Phase) Lab Results  Component Value Date   CRP 0.7 04/03/2015   ESRSEDRATE 11 10/21/2017   LATICACIDVEN 1.7 01/16/2019       Note: Above Lab results reviewed.  Recent Imaging Review  NM PET Image Restag (PS) Skull Base  To Thigh CLINICAL DATA:  Subsequent treatment strategy for lung cancer.  EXAM: NUCLEAR MEDICINE PET SKULL BASE TO THIGH  TECHNIQUE: 12.7 mCi F-18 FDG was injected intravenously. Full-ring PET imaging was performed from the skull base to thigh after the radiotracer. CT data was obtained and used for attenuation correction and anatomic localization.  Fasting blood glucose: 238 mg/dl  COMPARISON:  11/04/2018  FINDINGS: Mediastinal blood pool activity: SUV max 2.03  Liver activity: SUV max NA  NECK: No hypermetabolic lymph nodes in the neck.  Incidental CT findings: none  CHEST: Hypermetabolic mediastinal nodal enlargement  (Image 91, series 3) 14 mm lymph node (SUVmax = 5.1) adjacent slightly smaller lymph node with similar FDG uptake.  AP window lymph node (image 85, series 3) 12 mm (SUVmax = 3.5)  No additional hypermetabolic lymph nodes in the chest.  Parenchymal assessment limited by respiratory motion. No suspicious nodules within the limitations of the exam  Tiny nodule along the superior aspect of the major fissure in the RIGHT chest (image 78, series 3) stable at approximately 4 mm.  Postoperative findings of LEFT lower lobectomy.  Incidental CT findings: Atheromatous plaque in the thoracic aorta. Heart size normal without pericardial effusion. Postoperative changes in the chest as discussed above.  ABDOMEN/PELVIS: No abnormal hypermetabolic activity within the liver, pancreas, adrenal glands, or spleen. No hypermetabolic lymph nodes in the abdomen or pelvis.  Incidental CT findings: Marked hepatic steatosis. Focal fatty sparing about the gallbladder fossa. No acute intra-abdominal process. Post appendectomy  SKELETON: No focal hypermetabolic activity to suggest skeletal metastasis.  Incidental CT findings: Post lumbar spinal fusion and LEFT hip arthroplasty.  IMPRESSION: 1. Mediastinal nodal enlargement, mild but with increased metabolic activity  suspicious for nodal metastases. 2. Small pulmonary nodules better characterized on recent chest CT, small nodule along the superior aspect of the major fissure in the RIGHT chest is unchanged.  Electronically Signed   By: Zetta Bills M.D.   On: 08/23/2019 13:55 Note: Reviewed        Physical Exam  General appearance: Well nourished, well developed, and well hydrated. In no apparent acute distress Mental status: Alert, oriented x 3 (person, place, & time)       Respiratory: No evidence of acute respiratory distress Eyes: PERLA Vitals: BP 137/89   Pulse 82   Temp (!) 97.5 F (36.4 C)   Resp 16   Ht 5' 10" (1.778 m)   Wt 220 lb (99.8 kg)   SpO2 99%   BMI 31.57 kg/m  BMI: Estimated body mass index is 31.57 kg/m as calculated from the following:   Height as of this encounter: 5' 10" (1.778 m).   Weight as of this encounter: 220 lb (99.8 kg). Ideal: Ideal body weight: 73 kg (160 lb 15 oz) Adjusted ideal body weight: 83.7 kg (184 lb 9 oz)  Assessment   Status Diagnosis  Controlled Controlled Controlled 1. Chronic pain syndrome   2. Chronic low back pain (Primary Area of Pain) (Bilateral) (L>R)   3. Chronic lower extremity pain (Secondary area of Pain) (Left)   4. Chronic lumbar radicular pain (Secondary area of Pain) (S1 dermatomal) (Left)   5. Pharmacologic therapy   6. Uncomplicated opioid dependence ([place])      Updated Problems: No problems updated.  Plan of Care  Problem-specific:  No problem-specific Assessment & Plan notes found for this encounter.  Kristopher [name] has a current medication list which includes the following long-term medication(s): amlodipine, atorvastatin, hydralazine, metformin, metoprolol succinate, nortriptyline, [START ON 10/24/2019] oxycodone hcl, pantoprazole, potassium chloride er, and sumatriptan.  Pharmacotherapy (Medications Ordered): Meds ordered this encounter  Medications  . Oxycodone HCl 10 MG TABS    Sig: Take 1 tablet  (10 mg total) by mouth every 6 (six) hours as needed. Must last 30 days    Dispense:  120 tablet    Refill:  0    Chronic Pain: STOP Act (Not applicable) Fill 1 day early if closed on refill date. Do not fill until: 10/24/2019. To last until: 11/23/2019. Avoid benzodiazepines within 8 hours of opioids  . NONFORMULARY OR COMPOUNDED ITEM    Sig: Apply 1-2 mLs topically 4 (four) times daily as needed. 10% Ketamine/2%  Cyclobenzaprine/6% Gabapentin Cream    Dispense:  240 each    Refill:  6    Please compound: Ketamine (10%) + Cyclobenzaprine (2%) + Gabapentin Cream (6%) into cream   Orders:  Orders Placed This Encounter  Procedures  . ToxASSURE Select 13 (MW), Urine    Volume: 30 ml(s). Minimum 3 ml of urine is needed. Document temperature of fresh sample. Indications: Long term (current) use of opiate analgesic (W73.710)    Order Specific Question:   Release to patient    Answer:   Immediate   Follow-up plan:   Return in 6 weeks (on 11/22/2019) for (20-min), (F2F), (Med Mgmt), on E/M day to review UDS.      Interventional therapies: Planned, scheduled, and/or pending:   None at this time. Claims to have anaphylactic allergy to Local Anesthetics. He was sent for testing and shown/proven not to be allergic to Lidocaine.   Considering:   None at this time. Claims to have anaphylactic allergy to Local Anesthetics. He was sent for testing and shown/proven not to be allergic to Lidocaine.   Palliative PRN treatment(s):   None at this time. Claims to have anaphylactic allergy to Local Anesthetics. He was sent for testing and shown/proven not to be allergic to Lidocaine.      Recent Visits Date Type Provider Dept  10/13/19 Office Visit [name], MD [facility]  07/26/19 Telemedicine [name], MD [facility]  Showing recent visits within past 90 days and meeting all other requirements Future Appointments Date Type Provider Dept  11/22/19 Appointment  [name], MD [facility]  Showing future appointments within next 90 days and meeting all other requirements  I discussed the assessment and treatment plan with the patient. The patient was provided an opportunity to ask questions and all were answered. The patient agreed with the plan and demonstrated an understanding of the instructions.  Patient advised to call back or seek an in-person evaluation if the symptoms or condition worsens.  Duration of encounter: 30 minutes.  Note by: Gaspar Cola, MD Date: 10/13/2019; Time: 12:19 PM

## 2019-10-13 NOTE — Patient Instructions (Signed)
____________________________________________________________________________________________  Drug Holidays (Slow)  What is a "Drug Holiday"? Drug Holiday: is the name given to the period of time during which a patient stops taking a medication(s) for the purpose of eliminating tolerance to the drug.  Benefits . Improved effectiveness of opioids. . Decreased opioid dose needed to achieve benefits. . Improved pain with lesser dose.  What is tolerance? Tolerance: is the progressive decreased in effectiveness of a drug due to its repetitive use. With repetitive use, the body gets use to the medication and as a consequence, it loses its effectiveness. This is a common problem seen with opioid pain medications. As a result, a larger dose of the drug is needed to achieve the same effect that used to be obtained with a smaller dose.  How long should a "Drug Holiday" last? You should stay off of the pain medicine for at least 14 consecutive days. (2 weeks)  Should I stop the medicine "cold turkey"? No. You should always coordinate with your Pain Specialist so that he/she can provide you with the correct medication dose to make the transition as smoothly as possible.  How do I stop the medicine? Slowly. You will be instructed to decrease the daily amount of pills that you take by one (1) pill every seven (7) days. This is called a "slow downward taper" of your dose. For example: if you normally take four (4) pills per day, you will be asked to drop this dose to three (3) pills per day for seven (7) days, then to two (2) pills per day for seven (7) days, then to one (1) per day for seven (7) days, and at the end of those last seven (7) days, this is when the "Drug Holiday" would start.   Will I have withdrawals? By doing a "slow downward taper" like this one, it is unlikely that you will experience any significant withdrawal symptoms. Typically, what triggers withdrawals is the sudden stop of a high  dose opioid therapy. Withdrawals can usually be avoided by slowly decreasing the dose over a prolonged period of time. If you do not follow these instructions and decide to stop your medication abruptly, withdrawals may be possible.  What are withdrawals? Withdrawals: refers to the wide range of symptoms that occur after stopping or dramatically reducing opiate drugs after heavy and prolonged use. Withdrawal symptoms do not occur to patients that use low dose opioids, or those who take the medication sporadically. Contrary to benzodiazepine (example: Valium, Xanax, etc.) or alcohol withdrawals ("Delirium Tremens"), opioid withdrawals are not lethal. Withdrawals are the physical manifestation of the body getting rid of the excess receptors.  Expected Symptoms Early symptoms of withdrawal may include: . Agitation . Anxiety . Muscle aches . Increased tearing . Insomnia . Runny nose . Sweating . Yawning  Late symptoms of withdrawal may include: . Abdominal cramping . Diarrhea . Dilated pupils . Goose bumps . Nausea . Vomiting  Will I experience withdrawals? Due to the slow nature of the taper, it is very unlikely that you will experience any.  What is a slow taper? Taper: refers to the gradual decrease in dose.  (Last update: 09/22/2019) ____________________________________________________________________________________________    ____________________________________________________________________________________________  Medication Rules  Purpose: To inform patients, and their family members, of our rules and regulations.  Applies to: All patients receiving prescriptions (written or electronic).  Pharmacy of record: Pharmacy where electronic prescriptions will be sent. If written prescriptions are taken to a different pharmacy, please inform the nursing staff. The pharmacy   listed in the electronic medical record should be the one where you would like electronic prescriptions  to be sent.  Electronic prescriptions: In compliance with the [place] Strengthen Opioid Misuse Prevention (STOP) Act of 2017 (Session Law 2017-74/H243), effective March 04, 2018, all controlled substances must be electronically prescribed. Calling prescriptions to the pharmacy will cease to exist.  Prescription refills: Only during scheduled appointments. Applies to all prescriptions.  NOTE: The following applies primarily to controlled substances (Opioid* Pain Medications).   Type of encounter (visit): For patients receiving controlled substances, face-to-face visits are required. (Not an option or up to the patient.)  Patient's responsibilities: 1. Pain Pills: Bring all pain pills to every appointment (except for procedure appointments). 2. Pill Bottles: Bring pills in original pharmacy bottle. Always bring the newest bottle. Bring bottle, even if empty. 3. Medication refills: You are responsible for knowing and keeping track of what medications you take and those you need refilled. The day before your appointment: write a list of all prescriptions that need to be refilled. The day of the appointment: give the list to the admitting nurse. Prescriptions will be written only during appointments. No prescriptions will be written on procedure days. If you forget a medication: it will not be "Called in", "Faxed", or "electronically sent". You will need to get another appointment to get these prescribed. No early refills. Do not call asking to have your prescription filled early. 4. Prescription Accuracy: You are responsible for carefully inspecting your prescriptions before leaving our office. Have the discharge nurse carefully go over each prescription with you, before taking them home. Make sure that your name is accurately spelled, that your address is correct. Check the name and dose of your medication to make sure it is accurate. Check the number of pills, and the written instructions to  make sure they are clear and accurate. Make sure that you are given enough medication to last until your next medication refill appointment. 5. Taking Medication: Take medication as prescribed. When it comes to controlled substances, taking less pills or less frequently than prescribed is permitted and encouraged. Never take more pills than instructed. Never take medication more frequently than prescribed.  6. Inform other Doctors: Always inform, all of your healthcare providers, of all the medications you take. 7. Pain Medication from other Providers: You are not allowed to accept any additional pain medication from any other Doctor or Healthcare provider. There are two exceptions to this rule. (see below) In the event that you require additional pain medication, you are responsible for notifying us, as stated below. 8. Medication Agreement: You are responsible for carefully reading and following our Medication Agreement. This must be signed before receiving any prescriptions from our practice. Safely store a copy of your signed Agreement. Violations to the Agreement will result in no further prescriptions. (Additional copies of our Medication Agreement are available upon request.) 9. Laws, Rules, & Regulations: All patients are expected to follow all Federal and State Laws, Statutes, Rules, & Regulations. Ignorance of the Laws does not constitute a valid excuse.  10. Illegal drugs and Controlled Substances: The use of illegal substances (including, but not limited to marijuana and its derivatives) and/or the illegal use of any controlled substances is strictly prohibited. Violation of this rule may result in the immediate and permanent discontinuation of any and all prescriptions being written by our practice. The use of any illegal substances is prohibited. 11. Adopted CDC guidelines & recommendations: Target dosing levels will be at or   below 60 MME/day. Use of benzodiazepines** is not  recommended.  Exceptions: There are only two exceptions to the rule of not receiving pain medications from other Healthcare Providers. 1. Exception #1 (Emergencies): In the event of an emergency (i.e.: accident requiring emergency care), you are allowed to receive additional pain medication. However, you are responsible for: As soon as you are able, call our office [phone], at any time of the day or night, and leave a message stating your name, the date and nature of the emergency, and the name and dose of the medication prescribed. In the event that your call is answered by a member of our staff, make sure to document and save the date, time, and the name of the person that took your information.  2. Exception #2 (Planned Surgery): In the event that you are scheduled by another doctor or dentist to have any type of surgery or procedure, you are allowed (for a period no longer than 30 days), to receive additional pain medication, for the acute post-op pain. However, in this case, you are responsible for picking up a copy of our "Post-op Pain Management for Surgeons" handout, and giving it to your surgeon or dentist. This document is available at our office, and does not require an appointment to obtain it. Simply go to our office during business hours (Monday-Thursday from 8:00 AM to 4:00 PM) (Friday 8:00 AM to 12:00 Noon) or if you have a scheduled appointment with us, prior to your surgery, and ask for it by name. In addition, you will need to provide us with your name, name of your surgeon, type of surgery, and date of procedure or surgery.  *Opioid medications include: morphine, codeine, oxycodone, oxymorphone, hydrocodone, hydromorphone, meperidine, tramadol, tapentadol, buprenorphine, fentanyl, methadone. **Benzodiazepine medications include: diazepam (Valium), alprazolam (Xanax), clonazepam (Klonopine), lorazepam (Ativan), clorazepate (Tranxene), chlordiazepoxide (Librium), estazolam (Prosom),  oxazepam (Serax), temazepam (Restoril), triazolam (Halcion) (Last updated: 05/01/2017) ____________________________________________________________________________________________   ____________________________________________________________________________________________  Medication Recommendations and Reminders  Applies to: All patients receiving prescriptions (written and/or electronic).  Medication Rules & Regulations: These rules and regulations exist for your safety and that of others. They are not flexible and neither are we. Dismissing or ignoring them will be considered "non-compliance" with medication therapy, resulting in complete and irreversible termination of such therapy. (See document titled "Medication Rules" for more details.) In all conscience, because of safety reasons, we cannot continue providing a therapy where the patient does not follow instructions.  Pharmacy of record:   Definition: This is the pharmacy where your electronic prescriptions will be sent.   We do not endorse any particular pharmacy, however, we have experienced problems with [facility] not securing enough medication supply for the community.  We do not restrict you in your choice of pharmacy. However, once we write for your prescriptions, we will NOT be re-sending more prescriptions to fix restricted supply problems created by your pharmacy, or your insurance.   The pharmacy listed in the electronic medical record should be the one where you want electronic prescriptions to be sent.  If you choose to change pharmacy, simply notify our nursing staff.  Recommendations:  Keep all of your pain medications in a safe place, under lock and key, even if you live alone. We will NOT replace lost, stolen, or damaged medication.  After you fill your prescription, take 1 week's worth of pills and put them away in a safe place. You should keep a separate, properly labeled bottle for this purpose. The remainder    should be kept in the original bottle. Use this as your primary supply, until it runs out. Once it's gone, then you know that you have 1 week's worth of medicine, and it is time to come in for a prescription refill. If you do this correctly, it is unlikely that you will ever run out of medicine.  To make sure that the above recommendation works, it is very important that you make sure your medication refill appointments are scheduled at least 1 week before you run out of medicine. To do this in an effective manner, make sure that you do not leave the office without scheduling your next medication management appointment. Always ask the nursing staff to show you in your prescription , when your medication will be running out. Then arrange for the receptionist to get you a return appointment, at least 7 days before you run out of medicine. Do not wait until you have 1 or 2 pills left, to come in. This is very poor planning and does not take into consideration that we may need to cancel appointments due to bad weather, sickness, or emergencies affecting our staff.  DO NOT ACCEPT A "Partial Fill": If for any reason your pharmacy does not have enough pills/tablets to completely fill or refill your prescription, do not allow for a "partial fill". The law allows the pharmacy to complete that prescription within 72 hours, without requiring a new prescription. If they do not fill the rest of your prescription within those 72 hours, you will need a separate prescription to fill the remaining amount, which we will NOT provide. If the reason for the partial fill is your insurance, you will need to talk to the pharmacist about payment alternatives for the remaining tablets, but again, DO NOT ACCEPT A PARTIAL FILL, unless you can trust your pharmacist to obtain the remainder of the pills within 72 hours.  Prescription refills and/or changes in medication(s):   Prescription refills, and/or changes in dose or medication,  will be conducted only during scheduled medication management appointments. (Applies to both, written and electronic prescriptions.)  No refills on procedure days. No medication will be changed or started on procedure days. No changes, adjustments, and/or refills will be conducted on a procedure day. Doing so will interfere with the diagnostic portion of the procedure.  No phone refills. No medications will be "called into the pharmacy".  No Fax refills.  No weekend refills.  No Holliday refills.  No after hours refills.  Remember:  Business hours are:  Monday to Thursday 8:00 AM to 4:00 PM Provider's Schedule: Giavonni Cizek, MD - Appointments are:  Medication management: Monday and Wednesday 8:00 AM to 4:00 PM Procedure day: Tuesday and Thursday 7:30 AM to 4:00 PM Bilal Lateef, MD - Appointments are:  Medication management: Tuesday and Thursday 8:00 AM to 4:00 PM Procedure day: Monday and Wednesday 7:30 AM to 4:00 PM (Last update: 09/22/2019) ____________________________________________________________________________________________   ____________________________________________________________________________________________  CBD (cannabidiol) WARNING  Applicable to: All individuals currently taking or considering taking CBD (cannabidiol) and, more important, all patients taking opioid analgesic controlled substances (pain medication). (Example: oxycodone; oxymorphone; hydrocodone; hydromorphone; morphine; methadone; tramadol; tapentadol; fentanyl; buprenorphine; butorphanol; dextromethorphan; meperidine; codeine; etc.)  Legal status: CBD remains a Schedule I drug prohibited for any use. CBD is illegal with one exception. In the [place], CBD has a limited Food and Drug Administration (FDA) approval for the treatment of two specific types of epilepsy disorders. Only one CBD product has been approved by the   FDA for this purpose: "Epidiolex". FDA is aware that some  companies are marketing products containing cannabis and cannabis-derived compounds in ways that violate the Federal Food, Drug and Cosmetic Act (FD&C Act) and that may put the health and safety of consumers at risk. The FDA, a Federal agency, has not enforced the CBD status since 2018.   Legality: Some manufacturers ship CBD products nationally, which is illegal. Often such products are sold online and are therefore available throughout the country. CBD is openly sold in head shops and health food stores in some states where such sales have not been explicitly legalized. Selling unapproved products with unsubstantiated therapeutic claims is not only a violation of the law, but also can put patients at risk, as these products have not been proven to be safe or effective. Federal illegality makes it difficult to conduct research on CBD.  Reference: "FDA Regulation of Cannabis and Cannabis-Derived Products, Including Cannabidiol (CBD)" - https://www.fda.gov/news-events/public-health-focus/fda-regulation-cannabis-and-cannabis-derived-products-including-cannabidiol-cbd  Warning: CBD is not FDA approved and has not undergo the same manufacturing controls as prescription drugs.  This means that the purity and safety of available CBD may be questionable. Most of the time, despite manufacturer's claims, it is contaminated with THC (delta-9-tetrahydrocannabinol - the chemical in marijuana responsible for the "HIGH").  When this is the case, the THC contaminant will trigger a positive urine drug screen (UDS) test for Marijuana (carboxy-THC). Because a positive UDS for any illicit substance is a violation of our medication agreement, your opioid analgesics (pain medicine) may be permanently discontinued.  MORE ABOUT CBD  General Information: CBD  is a derivative of the Marijuana (cannabis sativa) plant discovered in 1940. It is one of the 113 identified substances found in Marijuana. It accounts for up to 40% of the  plant's extract. As of 2018, preliminary clinical studies on CBD included research for the treatment of anxiety, movement disorders, and pain. CBD is available and consumed in multiple forms, including inhalation of smoke or vapor, as an aerosol spray, and by mouth. It may be supplied as an oil containing CBD, capsules, dried cannabis, or as a liquid solution. CBD is thought not to be as psychoactive as THC (delta-9-tetrahydrocannabinol - the chemical in marijuana responsible for the "HIGH"). Studies suggest that CBD may interact with different biological target receptors in the body, including cannabinoid and other neurotransmitter receptors. As of 2018 the mechanism of action for its biological effects has not been determined.  Side-effects  Adverse reactions: Dry mouth, diarrhea, decreased appetite, fatigue, drowsiness, malaise, weakness, sleep disturbances, and others.  Drug interactions: CBC may interact with other medications such as blood-thinners. (Last update: 10/09/2019) ____________________________________________________________________________________________    

## 2019-10-15 ENCOUNTER — Ambulatory Visit: Payer: Medicare Other | Admitting: Certified Registered Nurse Anesthetist

## 2019-10-15 ENCOUNTER — Other Ambulatory Visit: Payer: Self-pay

## 2019-10-15 ENCOUNTER — Encounter
Admission: RE | Disposition: A | Discharge status: Home / Self Care | Payer: Self-pay | Source: Home / Self Care | Attending: Pulmonary Disease

## 2019-10-15 ENCOUNTER — Ambulatory Visit: Payer: Medicare Other

## 2019-10-15 ENCOUNTER — Ambulatory Visit: Payer: Medicare Other | Admitting: Urgent Care

## 2019-10-15 ENCOUNTER — Encounter: Payer: Self-pay | Admitting: Certified Registered Nurse Anesthetist

## 2019-10-15 ENCOUNTER — Ambulatory Visit
Admission: RE | Admit: 2019-10-15 | Discharge: 2019-10-15 | Disposition: A | Discharge status: Home / Self Care | Payer: Medicare Other | Attending: Pulmonary Disease | Admitting: Pulmonary Disease

## 2019-10-15 DIAGNOSIS — Z886 Allergy status to analgesic agent status: Secondary | ICD-10-CM | POA: Diagnosis not present

## 2019-10-15 DIAGNOSIS — R062 Wheezing: Secondary | ICD-10-CM | POA: Diagnosis not present

## 2019-10-15 DIAGNOSIS — R590 Localized enlarged lymph nodes: Secondary | ICD-10-CM | POA: Diagnosis not present

## 2019-10-15 DIAGNOSIS — F17210 Nicotine dependence, cigarettes, uncomplicated: Secondary | ICD-10-CM | POA: Diagnosis not present

## 2019-10-15 DIAGNOSIS — E119 Type 2 diabetes mellitus without complications: Secondary | ICD-10-CM | POA: Diagnosis not present

## 2019-10-15 DIAGNOSIS — I129 Hypertensive chronic kidney disease with stage 1 through stage 4 chronic kidney disease, or unspecified chronic kidney disease: Secondary | ICD-10-CM | POA: Insufficient documentation

## 2019-10-15 DIAGNOSIS — M19072 Primary osteoarthritis, left ankle and foot: Secondary | ICD-10-CM | POA: Insufficient documentation

## 2019-10-15 DIAGNOSIS — C771 Secondary and unspecified malignant neoplasm of intrathoracic lymph nodes: Secondary | ICD-10-CM | POA: Insufficient documentation

## 2019-10-15 DIAGNOSIS — R05 Cough: Secondary | ICD-10-CM | POA: Diagnosis not present

## 2019-10-15 DIAGNOSIS — G4733 Obstructive sleep apnea (adult) (pediatric): Secondary | ICD-10-CM | POA: Diagnosis not present

## 2019-10-15 DIAGNOSIS — N189 Chronic kidney disease, unspecified: Secondary | ICD-10-CM | POA: Diagnosis not present

## 2019-10-15 DIAGNOSIS — J449 Chronic obstructive pulmonary disease, unspecified: Secondary | ICD-10-CM | POA: Insufficient documentation

## 2019-10-15 DIAGNOSIS — C3432 Malignant neoplasm of lower lobe, left bronchus or lung: Secondary | ICD-10-CM | POA: Diagnosis not present

## 2019-10-15 DIAGNOSIS — M549 Dorsalgia, unspecified: Secondary | ICD-10-CM | POA: Diagnosis not present

## 2019-10-15 DIAGNOSIS — Z96642 Presence of left artificial hip joint: Secondary | ICD-10-CM | POA: Insufficient documentation

## 2019-10-15 DIAGNOSIS — I10 Essential (primary) hypertension: Secondary | ICD-10-CM | POA: Diagnosis not present

## 2019-10-15 DIAGNOSIS — Z85118 Personal history of other malignant neoplasm of bronchus and lung: Secondary | ICD-10-CM | POA: Insufficient documentation

## 2019-10-15 DIAGNOSIS — E1122 Type 2 diabetes mellitus with diabetic chronic kidney disease: Secondary | ICD-10-CM | POA: Insufficient documentation

## 2019-10-15 DIAGNOSIS — G8929 Other chronic pain: Secondary | ICD-10-CM | POA: Diagnosis not present

## 2019-10-15 DIAGNOSIS — Z880 Allergy status to penicillin: Secondary | ICD-10-CM | POA: Diagnosis not present

## 2019-10-15 HISTORY — PX: VIDEO BRONCHOSCOPY WITH ENDOBRONCHIAL ULTRASOUND: SHX6177

## 2019-10-15 HISTORY — PX: VIDEO BRONCHOSCOPY WITH ENDOBRONCHIAL NAVIGATION: SHX6175

## 2019-10-15 LAB — TOXASSURE SELECT 13 (MW), URINE

## 2019-10-15 LAB — GLUCOSE, CAPILLARY
Glucose-Capillary: 272 mg/dL — ABNORMAL HIGH (ref 70–99)
Glucose-Capillary: 155 mg/dL — ABNORMAL HIGH (ref 70–99)

## 2019-10-15 SURGERY — BRONCHOSCOPY, WITH EBUS
Anesthesia: General

## 2019-10-15 MED ORDER — FENTANYL CITRATE (PF) 100 MCG/2ML IJ SOLN
INTRAMUSCULAR | Status: DC | PRN
  Administered 2019-10-15: 50 ug via INTRAVENOUS

## 2019-10-15 MED ORDER — ONDANSETRON HCL 4 MG/2ML IJ SOLN
INTRAMUSCULAR | Status: AC
  Filled 2019-10-15: qty 2

## 2019-10-15 MED ORDER — DEXAMETHASONE SODIUM PHOSPHATE 10 MG/ML IJ SOLN
INTRAMUSCULAR | Status: DC | PRN
  Administered 2019-10-15: 10 mg via INTRAVENOUS

## 2019-10-15 MED ORDER — LIDOCAINE HCL (PF) 1 % IJ SOLN
30.0000 mL | Freq: Once | INTRAMUSCULAR | Status: DC
  Filled 2019-10-15: qty 30

## 2019-10-15 MED ORDER — ALBUTEROL SULFATE (2.5 MG/3ML) 0.083% IN NEBU
INHALATION_SOLUTION | RESPIRATORY_TRACT | Status: AC
  Filled 2019-10-15: qty 3

## 2019-10-15 MED ORDER — FAMOTIDINE 20 MG PO TABS: 20.0000 mg | ORAL_TABLET | Freq: Once | ORAL | Status: AC

## 2019-10-15 MED ORDER — DEXAMETHASONE SODIUM PHOSPHATE 10 MG/ML IJ SOLN
INTRAMUSCULAR | Status: AC
  Filled 2019-10-15: qty 1

## 2019-10-15 MED ORDER — CHLORHEXIDINE GLUCONATE 0.12 % MT SOLN
OROMUCOSAL | Status: AC
  Administered 2019-10-15: 15 mL via OROMUCOSAL
  Filled 2019-10-15: qty 15

## 2019-10-15 MED ORDER — ROCURONIUM BROMIDE 10 MG/ML (PF) SYRINGE
PREFILLED_SYRINGE | INTRAVENOUS | Status: AC
  Filled 2019-10-15: qty 10

## 2019-10-15 MED ORDER — METOPROLOL SUCCINATE ER 50 MG PO TB24
100.0000 mg | ORAL_TABLET | Freq: Once | ORAL | Status: AC
  Administered 2019-10-15: 100 mg via ORAL
  Filled 2019-10-15: qty 2

## 2019-10-15 MED ORDER — PHENYLEPHRINE HCL 0.25 % NA SOLN
1.0000 | Freq: Four times a day (QID) | NASAL | Status: DC | PRN
  Filled 2019-10-15: qty 15

## 2019-10-15 MED ORDER — PHENYLEPHRINE HCL (PRESSORS) 10 MG/ML IV SOLN
INTRAVENOUS | Status: DC | PRN
  Administered 2019-10-15 (×3): 100 ug via INTRAVENOUS

## 2019-10-15 MED ORDER — LIDOCAINE HCL URETHRAL/MUCOSAL 2 % EX GEL
1.0000 "application " | Freq: Once | CUTANEOUS | Status: DC
  Filled 2019-10-15: qty 5

## 2019-10-15 MED ORDER — LIDOCAINE HCL (PF) 2 % IJ SOLN
INTRAMUSCULAR | Status: AC
  Filled 2019-10-15: qty 5

## 2019-10-15 MED ORDER — ROCURONIUM BROMIDE 100 MG/10ML IV SOLN
INTRAVENOUS | Status: DC | PRN
  Administered 2019-10-15: 55 mg via INTRAVENOUS

## 2019-10-15 MED ORDER — ONDANSETRON HCL 4 MG/2ML IJ SOLN: 4.0000 mg | Freq: Once | INTRAMUSCULAR | Status: DC | PRN

## 2019-10-15 MED ORDER — SUGAMMADEX SODIUM 200 MG/2ML IV SOLN
INTRAVENOUS | Status: DC | PRN
  Administered 2019-10-15: 200 mg via INTRAVENOUS

## 2019-10-15 MED ORDER — MIDAZOLAM HCL 2 MG/2ML IJ SOLN
INTRAMUSCULAR | Status: AC
  Filled 2019-10-15: qty 2

## 2019-10-15 MED ORDER — ORAL CARE MOUTH RINSE: 15.0000 mL | Freq: Once | OROMUCOSAL | Status: AC

## 2019-10-15 MED ORDER — FENTANYL CITRATE (PF) 100 MCG/2ML IJ SOLN
INTRAMUSCULAR | Status: AC
  Filled 2019-10-15: qty 2

## 2019-10-15 MED ORDER — LIDOCAINE HCL (CARDIAC) PF 100 MG/5ML IV SOSY
PREFILLED_SYRINGE | INTRAVENOUS | Status: DC | PRN
  Administered 2019-10-15: 60 mg via INTRAVENOUS

## 2019-10-15 MED ORDER — SODIUM CHLORIDE 0.9 % IV SOLN: INTRAVENOUS | Status: DC

## 2019-10-15 MED ORDER — BUTAMBEN-TETRACAINE-BENZOCAINE 2-2-14 % EX AERO
1.0000 | INHALATION_SPRAY | Freq: Once | CUTANEOUS | Status: DC
  Filled 2019-10-15: qty 20

## 2019-10-15 MED ORDER — FAMOTIDINE 20 MG PO TABS
ORAL_TABLET | ORAL | Status: AC
  Administered 2019-10-15: 20 mg via ORAL
  Filled 2019-10-15: qty 1

## 2019-10-15 MED ORDER — PROPOFOL 10 MG/ML IV BOLUS
INTRAVENOUS | Status: AC
  Filled 2019-10-15: qty 20

## 2019-10-15 MED ORDER — ONDANSETRON HCL 4 MG/2ML IJ SOLN
INTRAMUSCULAR | Status: DC | PRN
  Administered 2019-10-15: 4 mg via INTRAVENOUS

## 2019-10-15 MED ORDER — FENTANYL CITRATE (PF) 100 MCG/2ML IJ SOLN: 25.0000 ug | INTRAMUSCULAR | Status: DC | PRN

## 2019-10-15 MED ORDER — MIDAZOLAM HCL 2 MG/2ML IJ SOLN
INTRAMUSCULAR | Status: DC | PRN
  Administered 2019-10-15: 2 mg via INTRAVENOUS

## 2019-10-15 MED ORDER — PROPOFOL 10 MG/ML IV BOLUS
INTRAVENOUS | Status: DC | PRN
  Administered 2019-10-15: 150 mg via INTRAVENOUS

## 2019-10-15 MED ORDER — CHLORHEXIDINE GLUCONATE 0.12 % MT SOLN: 15.0000 mL | Freq: Once | OROMUCOSAL | Status: AC

## 2019-10-15 NOTE — Discharge Instructions (Addendum)
    Flexible Bronchoscopy, Care After This sheet gives you information about how to care for yourself after your test. Your doctor may also give you more specific instructions. If you have problems or questions, contact your doctor. Follow these instructions at home: Eating and drinking  Do not eat or drink anything (not even water) for 2 hours after your test, or until your numbing medicine (local anesthetic) wears off.  When your numbness is gone and your cough and gag reflexes have come back, you may: ? Eat only soft foods. ? Slowly drink liquids.  The day after the test, go back to your normal diet. Driving  Do not drive for 24 hours if you were given a medicine to help you relax (sedative).  Do not drive or use heavy machinery while taking prescription pain medicine. General instructions   Take over-the-counter and prescription medicines only as told by your doctor.  Return to your normal activities as told. Ask what activities are safe for you.  Do not use any products that have nicotine or tobacco in them. This includes cigarettes and e-cigarettes. If you need help quitting, ask your doctor.  Keep all follow-up visits as told by your doctor. This is important. It is very important if you had a tissue sample (biopsy) taken. Get help right away if:  You have shortness of breath that gets worse.  You get light-headed.  You feel like you are going to pass out (faint).  You have chest pain.  You cough up: ? More than a little blood. ? More blood than before. Summary  Do not eat or drink anything (not even water) for 2 hours after your test, or until your numbing medicine wears off.  Do not use cigarettes. Do not use e-cigarettes.  Get help right away if you have chest pain. This information is not intended to replace advice given to you by your health care provider. Make sure you discuss any questions you have with your health care provider. Document Revised:  01/31/2017 Document Reviewed: 03/08/2016 Elsevier Patient Education  2020 [place]   1) The drugs that you were given will stay in your system until tomorrow so for the next 24 hours you should not:  A) Drive an automobile B) Make any legal decisions C) Drink any alcoholic beverage   2) You may resume regular meals tomorrow.  Today it is better to start with liquids and gradually work up to solid foods.  You may eat anything you prefer, but it is better to start with liquids, then soup and crackers, and gradually work up to solid foods.   3) Please notify your doctor immediately if you have any unusual bleeding, trouble breathing, redness and pain at the surgery site, drainage, fever, or pain not relieved by medication.    4) Additional Instructions:    Please contact your physician with any problems or Same Day Surgery at [phone], Monday through Friday 6 am to 4 pm, or [place] at [facility] number at [phone].

## 2019-10-15 NOTE — H&P (Signed)
Pulmonary Medicine          Date: 10/15/2019,   MRN# [id number] TANDRE CONLY [birth year]     AdmissionWeight: 99.8 kg                 CurrentWeight: 99.8 kg      CHIEF COMPLAINT:   Hilar lymphadenopathy with hx of lung cancer of superior segment of left lowe lobe   HISTORY OF PRESENT ILLNESS   [name] is 59 y.o. male who was referred to [facility] due to findings of mediastinal and hilar lymphadenopathy. He is on CPAP also which he only used for 1 wk and shares that at this time he finds it worsened his quality of sleep. We discussed that treatment emergent sleep issues including worsening apnea is expected in beginning. He complains of midabdominal pain with cough and seldom phlegm on expectoration. He has chronic cough associated with dizziness and at times expectorates but overall improved. Patient Had Left lower lobectomy 2020. Patient smokes actively appx 1/2 pack daily from 1.5 packs daily. Patient still able to walk/jog current mMRC at 1-2. He is currently jogging. He has COPD and seasonal allergies, he uses Singulair and Arnuity anoro PRN.     PAST MEDICAL HISTORY   Past Medical History:  Diagnosis Date  . Allergy   . Arthritis    left foot  . Benign hypertensive kidney disease   . Chronic back pain    Four rods in back  . Diabetes mellitus, type 2 ([place])   . Dyspnea   . GERD (gastroesophageal reflux disease)   . Hypertension   . Malignant neoplasm of lung ([place])   . Migraines    daily     SURGICAL HISTORY   Past Surgical History:  Procedure Laterality Date  . APPENDECTOMY    . BACK SURGERY    . COLONOSCOPY WITH PROPOFOL N/A 02/19/2016   Procedure: COLONOSCOPY WITH PROPOFOL;  Surgeon: Lucilla Lame, MD;  Location: [place];  Service: Endoscopy;  Laterality: N/A;  . COLONOSCOPY WITH PROPOFOL N/A 01/19/2018   Procedure: COLONOSCOPY WITH PROPOFOL;  Surgeon: Lucilla Lame, MD;  Location: [place];  Service:  Endoscopy;  Laterality: N/A;  Diabetic - oral meds  . DG OPERATIVE LEFT HIP ([place] HX)     10/19  . ELECTROMAGNETIC NAVIGATION BROCHOSCOPY Left 11/18/2018   Procedure: ELECTROMAGNETIC NAVIGATION BRONCHOSCOPY;  Surgeon: Tyler Pita, MD;  Location: [facility];  Service: Cardiopulmonary;  Laterality: Left;  . FLEXIBLE BRONCHOSCOPY Bilateral 01/20/2019   Procedure: FLEXIBLE BRONCHOSCOPY;  Surgeon: Ottie Glazier, MD;  Location: [facility];  Service: Thoracic;  Laterality: Bilateral;  . FLEXIBLE BRONCHOSCOPY Bilateral 01/22/2019   Procedure: FLEXIBLE BRONCHOSCOPY;  Surgeon: Ottie Glazier, MD;  Location: [facility];  Service: Thoracic;  Laterality: Bilateral;  . FOOT SURGERY Left    Screws and plates  . JOINT REPLACEMENT Left 12/2017   DR Rudene Christians Hip  . KNEE SURGERY Left    X 2  . LEG SURGERY    . LUNG CANCER SURGERY    . POLYPECTOMY N/A 02/19/2016   Procedure: POLYPECTOMY;  Surgeon: Lucilla Lame, MD;  Location: [place];  Service: Endoscopy;  Laterality: N/A;  . POLYPECTOMY  01/19/2018   Procedure: POLYPECTOMY;  Surgeon: Lucilla Lame, MD;  Location: [place];  Service: Endoscopy;;  . THORACOTOMY Left 01/14/2019   Procedure: THORACOTOMY MAJOR, LEFT;  Surgeon: Nestor Lewandowsky, MD;  Location: [facility];  Service: General;  Laterality: Left;  .  TOTAL HIP ARTHROPLASTY Left 12/02/2017   Procedure: TOTAL HIP ARTHROPLASTY ANTERIOR APPROACH;  Surgeon: Hessie Knows, MD;  Location: [facility];  Service: Orthopedics;  Laterality: Left;  [name] VIDEO BRONCHOSCOPY Left 01/14/2019   Procedure: VIDEO BRONCHOSCOPY WITH FLUORO, LEFT;  Surgeon: Nestor Lewandowsky, MD;  Location: [facility];  Service: General;  Laterality: Left;     FAMILY HISTORY   Family History  Problem Relation Age of Onset  . Cancer Father   . Diabetes Sister   . Thrombosis Sister      SOCIAL HISTORY   Social History   Tobacco Use  . Smoking status: Current Some Day Smoker    Packs/day: 0.05    Years: 35.00    Pack  years: 1.75    Types: Cigarettes  . Smokeless tobacco: Never Used  . Tobacco comment: 1 pack last 3 days   Vaping Use  . Vaping Use: Never used  Substance Use Topics  . Alcohol use: No    Alcohol/week: 0.0 standard drinks  . Drug use: Yes    Types: Oxycodone    Comment: prescribed     MEDICATIONS    Home Medication:    Current Medication:  Current Facility-Administered Medications:  .  0.9 %  sodium chloride infusion, , Intravenous, Continuous, Alvin Critchley, MD, Last Rate: 10 mL/hr at 10/15/19 1221, Continued from Pre-op at 10/15/19 1221 .  butamben-tetracaine-benzocaine (CETACAINE) spray 1 spray, 1 spray, Topical, Once, La Shehan, MD .  famotidine (PEPCID) 20 MG tablet, , , ,  .  famotidine (PEPCID) tablet 20 mg, 20 mg, Oral, Once, Piscitello, Precious Haws, MD .  lidocaine (PF) (XYLOCAINE) 1 % injection 30 mL, 30 mL, Infiltration, Once, Kamin Niblack, MD .  lidocaine (XYLOCAINE) 2 % jelly 1 application, 1 application, Topical, Once, Luismanuel Corman, MD .  metoprolol succinate (TOPROL-XL) 24 hr tablet 100 mg, 100 mg, Oral, Once, Piscitello, Precious Haws, MD .  phenylephrine (NEO-SYNEPHRINE) 0.25 % nasal spray 1 spray, 1 spray, Nasal, Q6H PRN, Ottie Glazier, MD  Facility-Administered Medications Ordered in Other Encounters:  .  0.9 %  sodium chloride infusion, , , Continuous PRN, Dionne Bucy, CRNA, New Bag at 01/22/19 0705    ALLERGIES   Acetaminophen, Aspirin, Epinephrine, Novocain [procaine], Penicillins, Strawberry extract, and Shellfish allergy     REVIEW OF SYSTEMS    Review of Systems:  Gen:  Denies  fever, sweats, chills weigh loss  HEENT: Denies blurred vision, double vision, ear pain, eye pain, hearing loss, nose bleeds, sore throat Cardiac:  No dizziness, chest pain or heaviness, chest tightness,edema Resp:   Denies cough or sputum porduction, shortness of breath,wheezing, hemoptysis,  Gi: Denies swallowing difficulty, stomach pain, nausea or  vomiting, diarrhea, constipation, bowel incontinence Gu:  Denies bladder incontinence, burning urine Ext:   Denies Joint pain, stiffness or swelling Skin: Denies  skin rash, easy bruising or bleeding or hives Endoc:  Denies polyuria, polydipsia , polyphagia or weight change Psych:   Denies depression, insomnia or hallucinations   Other:  All other systems negative   VS: BP (!) 132/103   Pulse 98   Resp 16   Ht _0  (1.778 m)   Wt 99.8 kg   SpO2 99%   BMI 31.57 kg/m      PHYSICAL EXAM    GENERAL:NAD, no fevers, chills, no weakness no fatigue HEAD: Normocephalic, atraumatic.  EYES: Pupils equal, round, reactive to light. Extraocular muscles intact. No scleral icterus.  MOUTH: Moist mucosal membrane. Dentition intact. No abscess noted.  EAR, NOSE, THROAT: Clear without exudates. No external lesions.  NECK: Supple. No thyromegaly. No nodules. No JVD.  PULMONARY: Clear to auscultation bilaterally  CARDIOVASCULAR: S1 and S2. Regular rate and rhythm. No murmurs, rubs, or gallops. No edema. Pedal pulses 2+ bilaterally.  GASTROINTESTINAL: Soft, nontender, nondistended. No masses. Positive bowel sounds. No hepatosplenomegaly.  MUSCULOSKELETAL: No swelling, clubbing, or edema. Range of motion full in all extremities.  NEUROLOGIC: Cranial nerves II through XII are intact. No gross focal neurological deficits. Sensation intact. Reflexes intact.  SKIN: No ulceration, lesions, rashes, or cyanosis. Skin warm and dry. Turgor intact.  PSYCHIATRIC: Mood, affect within normal limits. The patient is awake, alert and oriented x 3. Insight, judgment intact.       IMAGING    No results found.    ASSESSMENT/PLAN   Hilar and mediastinal lymphadenopathy   - Patient has been seen by oncology Dr Grayland Ormond    - recommendation for LN biopsy via EBUS    - reviewed plan with patient today he understands details of procedure and consented to proceed   - Complications include: bleeding, cough,  infection, pneumothorax, pneumomediastinum, possible need for chest tube and intubation with mechanical ventilation and rarely death.  Patient relates understanding above and all additional questions have been answered.       Thank you for allowing me to participate in the care of this patient.   Patient/Family are satisfied with care plan and all questions have been answered.   This document was prepared using Dragon voice recognition software and may include unintentional dictation errors.     Ottie Glazier, M.D.  Division of [place]

## 2019-10-15 NOTE — Progress Notes (Signed)
Patient was 88-91% on room air. Gave incentive spirometer. Patient used for a few minutes. Prior to d/c SPo2 98% on room air.

## 2019-10-15 NOTE — Progress Notes (Signed)
Patient up to recliner. States feels better sitting in recliner. 90-92% on room air.

## 2019-10-15 NOTE — Anesthesia Postprocedure Evaluation (Signed)
Anesthesia Post Note  Patient: Kristopher [name]  Procedure(s) Performed: VIDEO BRONCHOSCOPY WITH ENDOBRONCHIAL ULTRASOUND (N/A ) VIDEO BRONCHOSCOPY WITH ENDOBRONCHIAL NAVIGATION (N/A )  Patient location during evaluation: PACU Anesthesia Type: General Level of consciousness: awake and alert and oriented Pain management: pain level controlled Vital Signs Assessment: post-procedure vital signs reviewed and stable Respiratory status: spontaneous breathing, nonlabored ventilation and respiratory function stable Cardiovascular status: blood pressure returned to baseline and stable Postop Assessment: no signs of nausea or vomiting Anesthetic complications: no   No complications documented.   Last Vitals:  Vitals:   10/15/19 1141 10/15/19 1432  BP: (!) 132/103 111/86  Pulse: 98 75  Resp: 16 16  Temp:  (!) 36.2 C  SpO2: 99% 96%    Last Pain:  Vitals:   10/15/19 1432  TempSrc:   PainSc: Asleep                 Tayden Duran

## 2019-10-15 NOTE — Transfer of Care (Signed)
Immediate Anesthesia Transfer of Care Note  Patient: Kristopher [name]  Procedure(s) Performed: VIDEO BRONCHOSCOPY WITH ENDOBRONCHIAL ULTRASOUND (N/A ) VIDEO BRONCHOSCOPY WITH ENDOBRONCHIAL NAVIGATION (N/A )  Patient Location: PACU  Anesthesia Type:General  Level of Consciousness: awake, alert , oriented and patient cooperative  Airway & Oxygen Therapy: Patient Spontanous Breathing and Patient connected to face mask oxygen  Post-op Assessment: Report given to RN and Post -op Vital signs reviewed and stable  Post vital signs: Reviewed and stable  Last Vitals:  Vitals Value Taken Time  BP 111/86 10/15/19 1432  Temp    Pulse 75 10/15/19 1433  Resp 19 10/15/19 1433  SpO2 97 % 10/15/19 1433  Vitals shown include unvalidated device data.  Last Pain:  Vitals:   10/15/19 1141  TempSrc: Temporal  PainSc: 3          Complications: No complications documented.

## 2019-10-15 NOTE — Anesthesia Procedure Notes (Signed)
Procedure Name: Intubation Date/Time: 10/15/2019 1:22 PM Performed by: Eben Burow, CRNA Pre-anesthesia Checklist: Patient identified, Emergency Drugs available, Suction available and Patient being monitored Patient Re-evaluated:Patient Re-evaluated prior to induction Oxygen Delivery Method: Circle system utilized Preoxygenation: Pre-oxygenation with 100% oxygen Induction Type: IV induction Ventilation: Mask ventilation without difficulty Laryngoscope Size: McGraph and 4 Grade View: Grade I Tube type: Oral Tube size: 9.0 mm Number of attempts: 1 Airway Equipment and Method: Stylet,  Oral airway,  LTA kit utilized and Video-laryngoscopy Placement Confirmation: ETT inserted through vocal cords under direct vision,  positive ETCO2 and breath sounds checked- equal and bilateral Secured at: 21 cm Tube secured with: Tape Dental Injury: Teeth and Oropharynx as per pre-operative assessment

## 2019-10-15 NOTE — Telephone Encounter (Signed)
Spoke with Rip Harbour, she stated that [facility] has contacted [name] and that conference with their office has been scheduled for October 19, 2019. Patient has no additional needs at this time.   Closing referral pending any other needs of patient.

## 2019-10-15 NOTE — Anesthesia Preprocedure Evaluation (Signed)
Anesthesia Evaluation  Patient identified by MRN, date of birth, ID band Patient awake    Reviewed: Allergy & Precautions, H&P , NPO status , Patient's Chart, lab work & pertinent test results, reviewed documented beta blocker date and time   Airway Mallampati: II  TM Distance: >3 FB Neck ROM: full    Dental  (+) Teeth Intact   Pulmonary shortness of breath and with exertion, sleep apnea and Continuous Positive Airway Pressure Ventilation , COPD, Current Smoker,    Pulmonary exam normal        Cardiovascular Exercise Tolerance: Poor hypertension, On Medications negative cardio ROS Normal cardiovascular exam Rhythm:regular Rate:Normal     Neuro/Psych  Headaches,  Neuromuscular disease negative psych ROS   GI/Hepatic Neg liver ROS, GERD  Medicated,  Endo/Other  negative endocrine ROSdiabetes  Renal/GU Renal disease  negative genitourinary   Musculoskeletal   Abdominal   Peds  Hematology negative hematology ROS (+)   Anesthesia Other Findings Past Medical History: No date: Allergy No date: Arthritis     Comment:  left foot No date: Benign hypertensive kidney disease No date: Chronic back pain     Comment:  Four rods in back No date: Diabetes mellitus, type 2 ([facility]) No date: Dyspnea No date: GERD (gastroesophageal reflux disease) No date: Hypertension No date: Malignant neoplasm of lung ([facility]) No date: Migraines     Comment:  daily Past Surgical History: No date: APPENDECTOMY No date: BACK SURGERY 02/19/2016: COLONOSCOPY WITH PROPOFOL; N/A     Comment:  Procedure: COLONOSCOPY WITH PROPOFOL;  Surgeon: Lucilla Lame, MD;  Location: [place];  Service:               Endoscopy;  Laterality: N/A; 01/19/2018: COLONOSCOPY WITH PROPOFOL; N/A     Comment:  Procedure: COLONOSCOPY WITH PROPOFOL;  Surgeon: Lucilla Lame, MD;  Location: [place];  Service:                Endoscopy;  Laterality: N/A;  Diabetic - oral meds No date: DG OPERATIVE LEFT HIP [facility] HX)     Comment:  10/19 11/18/2018: ELECTROMAGNETIC NAVIGATION BROCHOSCOPY; Left     Comment:  Procedure: ELECTROMAGNETIC NAVIGATION BRONCHOSCOPY;                Surgeon: Tyler Pita, MD;  Location: [facility];                Service: Cardiopulmonary;  Laterality: Left; 01/20/2019: FLEXIBLE BRONCHOSCOPY; Bilateral     Comment:  Procedure: FLEXIBLE BRONCHOSCOPY;  Surgeon: Ottie Glazier, MD;  Location: [facility];  Service: Thoracic;                Laterality: Bilateral; 01/22/2019: FLEXIBLE BRONCHOSCOPY; Bilateral     Comment:  Procedure: FLEXIBLE BRONCHOSCOPY;  Surgeon: Ottie Glazier, MD;  Location: [facility];  Service: Thoracic;                Laterality: Bilateral; No date: FOOT SURGERY; Left     Comment:  Screws and plates 12/2017: JOINT REPLACEMENT; Left     Comment:  DR Rudene Christians Hip No date: KNEE SURGERY; Left     Comment:  X 2  No date: LEG SURGERY No date: LUNG CANCER SURGERY 02/19/2016: POLYPECTOMY; N/A     Comment:  Procedure: POLYPECTOMY;  Surgeon: Lucilla Lame, MD;                Location: [place];  Service: Endoscopy;                Laterality: N/A; 01/19/2018: POLYPECTOMY     Comment:  Procedure: POLYPECTOMY;  Surgeon: Lucilla Lame, MD;                Location: [place];  Service: Endoscopy;; 01/14/2019: THORACOTOMY; Left     Comment:  Procedure: THORACOTOMY MAJOR, LEFT;  Surgeon: Nestor Lewandowsky, MD;  Location: [facility];  Service: General;                Laterality: Left; 12/02/2017: TOTAL HIP ARTHROPLASTY; Left     Comment:  Procedure: TOTAL HIP ARTHROPLASTY ANTERIOR APPROACH;                Surgeon: Hessie Knows, MD;  Location: [facility];                Service: Orthopedics;  Laterality: Left; 01/14/2019: VIDEO BRONCHOSCOPY; Left     Comment:  Procedure: VIDEO BRONCHOSCOPY WITH FLUORO, LEFT;                Surgeon:  Nestor Lewandowsky, MD;  Location: [facility];                Service: General;  Laterality: Left; BMI    Body Mass Index: 31.57 kg/m     Reproductive/Obstetrics negative OB ROS                             Anesthesia Physical Anesthesia Plan  ASA: III  Anesthesia Plan: General ETT   Post-op Pain Management:    Induction:   PONV Risk Score and Plan: 2  Airway Management Planned:   Additional Equipment:   Intra-op Plan:   Post-operative Plan:   Informed Consent: I have reviewed the patients History and Physical, chart, labs and discussed the procedure including the risks, benefits and alternatives for the proposed anesthesia with the patient or authorized representative who has indicated his/her understanding and acceptance.     Dental Advisory Given  Plan Discussed with: CRNA  Anesthesia Plan Comments:         Anesthesia Quick Evaluation

## 2019-10-15 NOTE — Procedures (Addendum)
FIBEROPTIC BRONCHOSCOPY WITH BRONCHOALVEOLAR LAVAGE PROCEDURE NOTE  ENDOBRONCHIAL ULTRASOUND PROCEDURE NOTE    Flexible bronchoscopy was performed  by : Lanney Gins MD  assistance by : 1)Repiratory therapist  and 2)[employer] staff and 3) Anesthesia team    Indication for the procedure was :  Pre-procedural H&P. The following assessment was performed on the day of the procedure prior to initiating sedation History:  Chest pain n Dyspnea y Hemoptysis n Cough y Fever n Other pertinent items n  Examination Vital signs -reviewed as per nursing documentation today Cardiac    Murmurs: n  Rubs : n  Gallop: n Lungs Wheezing: n Rales : n Rhonchi :y  Other pertinent findings: SOB/hypoxemia due to chronic lung disease   Pre-procedural assessment for Procedural Sedation included: Depth of sedation: As per anesthesia team  ASA Classification:  2 Mallampati airway assessment: 3    Medication list reviewed: y  The patient's interval history was taken and revealed: no new complaints The pre- procedure physical examination revealed: No new findings Refer to prior clinic note for details.  Informed Consent: Informed consent was obtained from:  patient after explanation of procedure and risks, benefits, as well as alternative procedures available.  Explanation of level of sedation and possible transfusion was also provided.    Procedural Preparation: Time out was performed and patient was identified by name and birthdate and procedure to be performed and side for sampling, if any, was specified. Pt was intubated by anesthesia.  The patient was appropriately draped.   Fiberoptic bronchoscopy with airway inspection and BAL Procedure findings:  Bronchoscope was inserted via ETT  without difficulty.  Posterior oropharynx, epiglottis, arytenoids, false cords and vocal cords were not visualized as these were bypassed by endotracheal tube. The distal trachea was normal in  circumference and appearance without mucosal, cartilaginous or branching abnormalities.  The main carina was mildly splayed . All right and left lobar airways were visualized to the Subsegmental level.  Sub- sub segmental carinae were identified in all the distal airways.   Secretions were visible in the following airways and appeared to be clear.  The mucosa was : non-friable  Airways were notable for:        exophytic lesions :n       extrinsic compression in the following distributions: n.       Friable mucosa: n       Anthrocotic material /pigmentation: n   -BAL was performed at [facility] due to thickened mucoid phlegm throughout- sent for microbiology  Post procedure Diagnosis:   Normal airway inspection with normal post surgical changes at [facility]        Endobronchial ultrasound assisted hilar and mediastinal lymph node biopsies procedure findings: The fiberoptic bronchoscope was removed and the EBUS scope was introduced. Examination began to evaluate for pathologically enlarged lymph nodes starting on the Rgith side progressing to the left side.  All lymph node biopsies performed with 21g needle. Lymph node biopsies were sent in cytolite for all stations.   Station 4R was 1.1cm biopsied 4 times with good lymphoid shedding and scant atypical cells reported by [facility] staff. Station 10R was 1.3cm under US guided measurement was biopsied 6 times and with findings of good lymphoid shedding and atypical cells found by [facility] staff, station 7 was biopsied next it was measured under [place] visual to be 2.1cm and biopsied 5 times with good lymphoid shedding and positive atypical cells reported by [facility] staff.     Post procedure diagnosis:  Lung cancer   Broncho-alveolar lavage site:[facility]  sent for microbiology                             104m volume infused 331mvolume returned with cellular and mucoid appearance  Fluoroscopy Used: none;        Pictorial documentation attached: n        Immediate  sampling complications included:none Epinephrine 74m48mas used topically  The bronchoscopy was terminated due to completion of the planned procedure and the bronchoscope was removed.   Total dosage of Lidocaine was 74mg29mtal fluoroscopy time was 0 minutes   Estimated Blood loss: 1cc.  Complications included:  none   Preliminary CXR findings :  n/a  Disposition: home with wife  Follow up with Dr. AlesLanney Gins5 days for result discussion.     FuadOttie Glazier KC [facility] of Pulmonary & Critical Care Medicine

## 2019-10-18 ENCOUNTER — Encounter: Payer: Self-pay | Admitting: Pulmonary Disease

## 2019-10-19 LAB — CYTOLOGY - NON PAP

## 2019-10-19 LAB — CULTURE, BAL-QUANTITATIVE W GRAM STAIN
Culture: 1000 — AB
Gram Stain: NONE SEEN
Special Requests: NORMAL

## 2019-10-20 ENCOUNTER — Encounter: Payer: Medicare Other | Admitting: Pain Medicine

## 2019-10-20 ENCOUNTER — Other Ambulatory Visit: Payer: Self-pay | Admitting: Family Medicine

## 2019-10-20 NOTE — Telephone Encounter (Signed)
Requested Prescriptions  Pending Prescriptions Disp Refills  . nortriptyline (PAMELOR) 25 MG capsule [Pharmacy Med Name: NORTRIPTYLINE HCL 25 MG CAP] 180 capsule 1    Sig: TAKE 2 CAPSULES (50 MG TOTAL) BY MOUTH AT BEDTIME.     Psychiatry:  Antidepressants - Heterocyclics (TCAs) Passed - 10/20/2019  1:21 AM      Passed - Valid encounter within last 6 months    Recent Outpatient Visits          4 weeks ago Type 2 diabetes mellitus with stage 1 chronic kidney disease, without long-term current use of insulin ([place])   [place], [name], DO   1 month ago Type 2 diabetes mellitus with stage 1 chronic kidney disease, without long-term current use of insulin ([place])   [place], [name], DO   3 months ago Pain, dental   [facility] Johnson, [name], DO   4 months ago OSA (obstructive sleep apnea)   [facility], [name], DO   6 months ago Type 2 diabetes mellitus with stage 1 chronic kidney disease, without long-term current use of insulin ([place])   [place], [place], DO      Future Appointments            In 1 month Johnson, Barb Merino, DO [place], [name]   In 51 months  [facility] [employer], [name]

## 2019-10-22 DIAGNOSIS — C3490 Malignant neoplasm of unspecified part of unspecified bronchus or lung: Secondary | ICD-10-CM | POA: Diagnosis not present

## 2019-10-22 DIAGNOSIS — F17218 Nicotine dependence, cigarettes, with other nicotine-induced disorders: Secondary | ICD-10-CM | POA: Diagnosis not present

## 2019-10-25 ENCOUNTER — Telehealth: Payer: Self-pay | Admitting: *Deleted

## 2019-10-25 ENCOUNTER — Other Ambulatory Visit: Payer: Self-pay | Admitting: Pulmonary Disease

## 2019-10-25 ENCOUNTER — Telehealth: Payer: Self-pay | Admitting: Family Medicine

## 2019-10-25 DIAGNOSIS — C3490 Malignant neoplasm of unspecified part of unspecified bronchus or lung: Secondary | ICD-10-CM

## 2019-10-25 NOTE — Telephone Encounter (Signed)
Spoke with patient.  States he has called around to other pharmacies and no one has the oxycodone 10 mg.  Informed patient that I would send a message to the provider and see what he would like to do.

## 2019-10-25 NOTE — Telephone Encounter (Signed)
Patient called to get a script for his reflux medication.  It was not on his med. List and patient said he has not taken it for a while.   Patient would like a call back to discuss this medication.  CB# [phone]

## 2019-10-26 ENCOUNTER — Telehealth: Payer: Self-pay

## 2019-10-26 ENCOUNTER — Other Ambulatory Visit: Payer: Self-pay | Admitting: *Deleted

## 2019-10-26 ENCOUNTER — Telehealth (HOSPITAL_COMMUNITY): Payer: Self-pay | Admitting: Hospice and Palliative Medicine

## 2019-10-26 MED ORDER — ESOMEPRAZOLE MAGNESIUM 40 MG PO CPDR
40.0000 mg | DELAYED_RELEASE_CAPSULE | Freq: Every day | ORAL | 3 refills | Status: DC
Start: 2019-10-26 — End: 2020-09-01

## 2019-10-26 NOTE — Telephone Encounter (Signed)
His cancer doctor wants to know if you can change his oxycodone to 5mg  instead of 10mg  because of the shortage. Please let him know.

## 2019-10-26 NOTE — Telephone Encounter (Signed)
Patient called the cancer center inquiring about pain medication.  He is an established patient of Dr. Dossie Arbour with pain management.  Patient says he was recently given a refill of oxycodone 10 mg but has not been able to locate a pharmacy that has been in [place].  I explained that we would not be prescribing his pain medication as it would be in violation of his established pain contract.  I encouraged him to contact Dr. Adalberto Cole office to see if any changes could be accommodated to his pain regimen.

## 2019-10-26 NOTE — Telephone Encounter (Signed)
Spoke with patient and upon suggestion of his cancer provider he is calling to see if Dr Dossie Arbour will change his oxycodone 10 mg to 5 mg and double the qty.  His medications were d/t pickup on 10/24/19 but they do not have them.  However, [facility] [place] does [facility] oxycodone 5 mg that he could pick up.  I told him I would check with Dr Dossie Arbour to see if this is an option for him.

## 2019-10-26 NOTE — Telephone Encounter (Signed)
Appt

## 2019-10-27 ENCOUNTER — Other Ambulatory Visit: Payer: Self-pay | Admitting: Pain Medicine

## 2019-10-27 ENCOUNTER — Telehealth: Payer: Self-pay

## 2019-10-27 DIAGNOSIS — G894 Chronic pain syndrome: Secondary | ICD-10-CM

## 2019-10-27 MED ORDER — MORPHINE SULFATE ER 30 MG PO TBCR: 30.0000 mg | EXTENDED_RELEASE_TABLET | Freq: Two times a day (BID) | ORAL | 0 refills | Status: DC

## 2019-10-27 NOTE — Telephone Encounter (Signed)
Called patient and let him know that he has 2 options.  If he wants to continue oxycodone 5 mg, Dr Dossie Arbour will begin a taper and then no longer prescribe.  Or, he will send in MS Contin 30 mg po bid.  Patient would like to go with the MS contin 30 mg and will call pharmacy for pickup.  I have informed him that he needs to go ahead and call in case prior Josem Kaufmann is needed..  Also counseled about medication administration.. to never break the tablet in 1/2 as this could result in overdose or death.  Patient verbalizes u/o information.    Voicemail left with pharmacy to cancel any existing Rx for oxycodone.  Patient already has appt scheduled that is F2F.

## 2019-10-27 NOTE — Progress Notes (Signed)
Patient unable to get the oxycodone due to shortage.  Today we have switched him from the oxycodone IR 10 mg p.o. every 6 (60 MME) to MS Contin 30 mg p.o. twice daily (60 MME).

## 2019-10-28 ENCOUNTER — Ambulatory Visit
Admission: RE | Admit: 2019-10-28 | Discharge: 2019-10-28 | Disposition: A | Discharge status: Home / Self Care | Payer: Medicare Other | Source: Ambulatory Visit | Attending: Pulmonary Disease | Admitting: Pulmonary Disease

## 2019-10-28 ENCOUNTER — Other Ambulatory Visit
Admission: RE | Admit: 2019-10-28 | Discharge: 2019-10-28 | Disposition: A | Discharge status: Home / Self Care | Payer: Medicare Other | Source: Ambulatory Visit | Attending: Pulmonary Disease | Admitting: Pulmonary Disease

## 2019-10-28 ENCOUNTER — Other Ambulatory Visit: Payer: Self-pay

## 2019-10-28 DIAGNOSIS — C3492 Malignant neoplasm of unspecified part of left bronchus or lung: Secondary | ICD-10-CM | POA: Diagnosis not present

## 2019-10-28 DIAGNOSIS — C3490 Malignant neoplasm of unspecified part of unspecified bronchus or lung: Secondary | ICD-10-CM | POA: Insufficient documentation

## 2019-10-28 DIAGNOSIS — R911 Solitary pulmonary nodule: Secondary | ICD-10-CM | POA: Diagnosis not present

## 2019-10-28 DIAGNOSIS — K760 Fatty (change of) liver, not elsewhere classified: Secondary | ICD-10-CM | POA: Diagnosis not present

## 2019-10-28 DIAGNOSIS — I700 Atherosclerosis of aorta: Secondary | ICD-10-CM | POA: Diagnosis not present

## 2019-10-28 DIAGNOSIS — K5730 Diverticulosis of large intestine without perforation or abscess without bleeding: Secondary | ICD-10-CM | POA: Diagnosis not present

## 2019-10-28 LAB — CREATININE, SERUM
Creatinine, Ser: 0.85 mg/dL (ref 0.61–1.24)
GFR calc Af Amer: >60 mL/min (ref >60)
GFR calc non Af Amer: >60 mL/min (ref >60)

## 2019-10-28 MED ORDER — IOHEXOL 300 MG/ML  SOLN
100.0000 mL | Freq: Once | INTRAMUSCULAR | Status: AC | PRN
  Administered 2019-10-28: 100 mL via INTRAVENOUS

## 2019-10-29 ENCOUNTER — Telehealth: Payer: Self-pay | Admitting: Pharmacist

## 2019-10-29 ENCOUNTER — Telehealth: Payer: Self-pay

## 2019-10-29 NOTE — Progress Notes (Signed)
  Chronic Care Management   Outreach Note  10/29/2019 Name: FREDDY KINNE MRN: [id number] DOB: 1960-09-18  Referred by: [name], DO Reason for referral : No chief complaint on file.   An unsuccessful telephone outreach was attempted today. The patient was referred to the pharmacist for assistance with care management and care coordination.  Left HIPAA compliant message  to return my call at his convenience.  Will collaborate with care guides to reschedule.    Junita Push. Kenton Kingfisher PharmD, [facility] [phone]

## 2019-10-30 NOTE — Progress Notes (Signed)
[place]  [phone]) [phone] Fax:([phone]) [phone]  ID: Kristopher [name] OB: 27-Aug-1960  MR#: [id number]  MAY#:[id number]  Patient Care Team: [name], DO as PCP - General (Family Medicine) Gavin Pound, CMA (Inactive) as Certified Medical Assistant Telford Nab, RN as Registered Nurse Vanita Ingles, RN as Case Manager (General Practice) [name], MD as Referring Physician (Pain Medicine) [name], MD as Consulting Physician (Oncology)  I connected with Kristopher [name] on 11/01/19 at 10:45 AM EDT by video enabled telemedicine visit and verified that I am speaking with the correct person using two identifiers.   I discussed the limitations, risks, security and privacy concerns of performing an evaluation and management service by telemedicine and the availability of in-person appointments. I also discussed with the patient that there may be a patient responsible charge related to this service. The patient expressed understanding and agreed to proceed.   Other persons participating in the visit and their role in the encounter: Patient, MD.  Patient's location: Clinic. Provider's location: Home.  CHIEF COMPLAINT: Clinical stage IIIa squamous cell 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  INTERVAL HISTORY: Patient agreed to video assisted telemedicine visit for further evaluation, discussion of his biopsy results, and treatment planning.  He currently feels well and is asymptomatic.  He denies any weakness or fatigue. He has a good appetite and denies weight loss. He has no neurologic complaints.  He denies any recent fevers or illnesses.  He has no chest pain, shortness of breath, cough, or hemoptysis.  He denies any nausea, vomiting, constipation, or diarrhea.  He has no urinary complaints.  Patient offers no specific complaints today.  REVIEW OF SYSTEMS:   Review of Systems  Constitutional: Negative.  Negative for fever,  malaise/fatigue and weight loss.  Respiratory: Negative.  Negative for cough and shortness of breath.   Cardiovascular: Negative.  Negative for chest pain and leg swelling.  Gastrointestinal: Negative.  Negative for abdominal pain.  Genitourinary: Negative.  Negative for dysuria and flank pain.  Musculoskeletal: Positive for back pain.  Skin: Negative.  Negative for rash.  Neurological: Negative.  Negative for dizziness, focal weakness, weakness and headaches.  Psychiatric/Behavioral: Negative.  The patient is not nervous/anxious.     As per HPI. Otherwise, a complete review of systems is negative.  PAST MEDICAL HISTORY: Past Medical History:  Diagnosis Date  . Allergy   . Arthritis    left foot  . Benign hypertensive kidney disease   . Chronic back pain    Four rods in back  . Diabetes mellitus, type 2 ([place])   . Dyspnea   . GERD (gastroesophageal reflux disease)   . Hypertension   . Malignant neoplasm of lung ([place])   . Migraines    daily    PAST SURGICAL HISTORY: Past Surgical History:  Procedure Laterality Date  . APPENDECTOMY    . BACK SURGERY    . COLONOSCOPY WITH PROPOFOL N/A 02/19/2016   Procedure: COLONOSCOPY WITH PROPOFOL;  Surgeon: Lucilla Lame, MD;  Location: [place];  Service: Endoscopy;  Laterality: N/A;  . COLONOSCOPY WITH PROPOFOL N/A 01/19/2018   Procedure: COLONOSCOPY WITH PROPOFOL;  Surgeon: Lucilla Lame, MD;  Location: [place];  Service: Endoscopy;  Laterality: N/A;  Diabetic - oral meds  . DG OPERATIVE LEFT HIP ([place] [facility])     10/19  . ELECTROMAGNETIC NAVIGATION BROCHOSCOPY Left 11/18/2018   Procedure: ELECTROMAGNETIC NAVIGATION BRONCHOSCOPY;  Surgeon: Tyler Pita, MD;  Location: [facility]  ORS;  Service: Cardiopulmonary;  Laterality: Left;  . FLEXIBLE BRONCHOSCOPY Bilateral 01/20/2019   Procedure: FLEXIBLE BRONCHOSCOPY;  Surgeon: Ottie Glazier, MD;  Location: [facility] ORS;  Service: Thoracic;  Laterality: Bilateral;  . FLEXIBLE  BRONCHOSCOPY Bilateral 01/22/2019   Procedure: FLEXIBLE BRONCHOSCOPY;  Surgeon: Ottie Glazier, MD;  Location: [facility] ORS;  Service: Thoracic;  Laterality: Bilateral;  . FOOT SURGERY Left    Screws and plates  . JOINT REPLACEMENT Left 12/2017   DR Rudene Christians Hip  . KNEE SURGERY Left    X 2  . LEG SURGERY    . LUNG CANCER SURGERY    . POLYPECTOMY N/A 02/19/2016   Procedure: POLYPECTOMY;  Surgeon: Lucilla Lame, MD;  Location: [place];  Service: Endoscopy;  Laterality: N/A;  . POLYPECTOMY  01/19/2018   Procedure: POLYPECTOMY;  Surgeon: Lucilla Lame, MD;  Location: [place];  Service: Endoscopy;;  . THORACOTOMY Left 01/14/2019   Procedure: THORACOTOMY MAJOR, LEFT;  Surgeon: Nestor Lewandowsky, MD;  Location: [facility] ORS;  Service: General;  Laterality: Left;  . TOTAL HIP ARTHROPLASTY Left 12/02/2017   Procedure: TOTAL HIP ARTHROPLASTY ANTERIOR APPROACH;  Surgeon: Hessie Knows, MD;  Location: [facility] ORS;  Service: Orthopedics;  Laterality: Left;  [name] VIDEO BRONCHOSCOPY Left 01/14/2019   Procedure: VIDEO BRONCHOSCOPY WITH FLUORO, LEFT;  Surgeon: Nestor Lewandowsky, MD;  Location: [facility] ORS;  Service: General;  Laterality: Left;  [name] VIDEO BRONCHOSCOPY WITH ENDOBRONCHIAL NAVIGATION N/A 10/15/2019   Procedure: VIDEO BRONCHOSCOPY WITH ENDOBRONCHIAL NAVIGATION;  Surgeon: Ottie Glazier, MD;  Location: [facility] ORS;  Service: Thoracic;  Laterality: N/A;  . VIDEO BRONCHOSCOPY WITH ENDOBRONCHIAL ULTRASOUND N/A 10/15/2019   Procedure: VIDEO BRONCHOSCOPY WITH ENDOBRONCHIAL ULTRASOUND;  Surgeon: Ottie Glazier, MD;  Location: [facility] ORS;  Service: Thoracic;  Laterality: N/A;    FAMILY HISTORY: Family History  Problem Relation Age of Onset  . Cancer Father   . Diabetes Sister   . Thrombosis Sister     ADVANCED DIRECTIVES (Y/N):  N  HEALTH MAINTENANCE: Social History   Tobacco Use  . Smoking status: Current Some Day Smoker    Packs/day: 0.05    Years: 35.00    Pack years: 1.75    Types: Cigarettes  .  Smokeless tobacco: Never Used  . Tobacco comment: 1 pack last 3 days   Vaping Use  . Vaping Use: Never used  Substance Use Topics  . Alcohol use: No    Alcohol/week: 0.0 standard drinks  . Drug use: Yes    Types: Oxycodone    Comment: prescribed     Colonoscopy:  PAP:  Bone density:  Lipid panel:  Allergies  Allergen Reactions  . Acetaminophen Swelling  . Aspirin Anaphylaxis  . Epinephrine Anaphylaxis    Does not include albuterol  . Novocain [Procaine] Anaphylaxis  . Penicillins Anaphylaxis    Has patient had a PCN reaction causing immediate rash, facial/tongue/throat swelling, SOB or lightheadedness with hypotension: Yes Has patient had a PCN reaction causing severe rash involving mucus membranes or skin necrosis: No Has patient had a PCN reaction that required hospitalization: Yes Has patient had a PCN reaction occurring within the last 10 years: No If all of the above answers are "NO", then may proceed with Cephalosporin use.   . Strawberry Extract Anaphylaxis  . Shellfish Allergy Hives and Nausea And Vomiting    Current Outpatient Medications  Medication Sig Dispense Refill  . Accu-Chek FastClix Lancets MISC USE TO TEST BLOOD SUGAR 2X A DAY 100 each 12  . ACCU-CHEK GUIDE test strip  USE TO TEST BLOOD SUGAR 2X A DAY 100 strip 12  . amLODipine (NORVASC) 5 MG tablet Take 1 tablet (5 mg total) by mouth 2 (two) times daily. 180 tablet 1  . ARNUITY ELLIPTA 100 MCG/ACT AEPB Inhale 1 puff into the lungs daily.    [name] atorvastatin (LIPITOR) 10 MG tablet Take 1 tablet (10 mg total) by mouth daily at 6 PM. 90 tablet 1  . Dulaglutide (TRULICITY) 1.5 TK/2.4OX SOPN Inject 0.5 mLs (1.5 mg total) into the skin once a week. 4 pen 3  . esomeprazole (NEXIUM) 40 MG capsule Take 1 capsule (40 mg total) by mouth daily. 90 capsule 3  . hydrALAZINE (APRESOLINE) 100 MG tablet Take 1 tablet (100 mg total) by mouth 2 (two) times daily. 180 tablet 1  . lidocaine (LIDODERM) 5 % Place 1 patch onto  the skin daily. Remove & Discard patch within 12 hours or as directed by MD (Patient taking differently: Place 1 patch onto the skin daily as needed (pain). Remove & Discard patch within 12 hours or as directed by MD) 30 patch 12  . metFORMIN (GLUCOPHAGE) 500 MG tablet Take 2 tablets (1,000 mg total) by mouth 2 (two) times daily with a meal. 360 tablet 1  . metoprolol succinate (TOPROL-XL) 100 MG 24 hr tablet Take 2 tablets (200 mg total) by mouth in the morning and at bedtime. Take with or immediately following a meal. 360 tablet 1  . montelukast (SINGULAIR) 10 MG tablet Take 10 mg by mouth daily.    [name] morphine (MS CONTIN) 30 MG 12 hr tablet Take 1 tablet (30 mg total) by mouth every 12 (twelve) hours. Must last 30 days. Do not break tablet 60 tablet 0  . NONFORMULARY OR COMPOUNDED ITEM Apply 1-2 mLs topically 4 (four) times daily as needed. 10% Ketamine/2% Cyclobenzaprine/6% Gabapentin Cream 240 each 6  . nortriptyline (PAMELOR) 25 MG capsule TAKE 2 CAPSULES (50 MG TOTAL) BY MOUTH AT BEDTIME. 180 capsule 1  . pantoprazole (PROTONIX) 40 MG tablet Take 1 tablet (40 mg total) by mouth daily. 15 tablet 2  . Potassium Chloride ER 20 MEQ TBCR TAKE 1 TABLET BY MOUTH EVERY DAY (Patient taking differently: Take 20 mEq by mouth daily. ) 90 tablet 1  . SUMAtriptan (IMITREX) 50 MG tablet TAKE 1 TAB AT ONSET OF MIGRAINE. MAY REPEAT IN 2 HOURS IF HEADACHE PERSISTS OR RECURS. (Patient taking differently: Take 50 mg by mouth every 2 (two) hours as needed for migraine. Take 1 tab at onset of migraine. May repeat in 2 hours if headache persists or recurs.) 10 tablet 0  . lidocaine-prilocaine (EMLA) cream Apply to affected area once 30 g 3  . ondansetron (ZOFRAN) 8 MG tablet Take 1 tablet (8 mg total) by mouth 2 (two) times daily as needed for refractory nausea / vomiting. 60 tablet 1  . prochlorperazine (COMPAZINE) 10 MG tablet Take 1 tablet (10 mg total) by mouth every 6 (six) hours as needed (Nausea or vomiting). 60  tablet 2   No current facility-administered medications for this visit.   Facility-Administered Medications Ordered in Other Visits  Medication Dose Route Frequency Provider Last Rate Last Admin  . 0.9 %  sodium chloride infusion    Continuous PRN Dionne Bucy, CRNA   New Bag at 01/22/19 0705    OBJECTIVE: Vitals:   11/01/19 1050  BP: 130/89  Pulse: 80  Temp: 97.8 F (36.6 C)  SpO2: 100%     Body mass index is 33.81 kg/m.  ECOG FS:0 - Asymptomatic  General: Well-developed, well-nourished, no acute distress. HEENT: Normocephalic. Neuro: Alert, answering all questions appropriately. Cranial nerves grossly intact. Psych: Normal affect.   LAB RESULTS:  Lab Results  Component Value Date   NA 139 08/25/2019   K 3.8 08/25/2019   CL 99 08/25/2019   CO2 25 08/25/2019   GLUCOSE 235 (H) 08/25/2019   BUN 9 08/25/2019   CREATININE 0.85 10/28/2019   CALCIUM 9.5 08/25/2019   PROT 7.3 04/27/2019   ALBUMIN 4.5 04/27/2019   AST 43 (H) 04/27/2019   ALT 69 (H) 04/27/2019   ALKPHOS 129 (H) 04/27/2019   BILITOT 0.3 04/27/2019   GFRNONAA >60 10/28/2019   GFRAA >60 10/28/2019    Lab Results  Component Value Date   WBC 6.6 10/06/2019   NEUTROABS 4.3 04/27/2019   HGB 12.4 (L) 10/06/2019   HCT 36.7 (L) 10/06/2019   MCV 89.3 10/06/2019   PLT 182 10/06/2019     STUDIES: CT CHEST ABDOMEN PELVIS W CONTRAST  Result Date: 10/28/2019 CLINICAL DATA:  Recurrent squamous cell carcinoma left lung, status post left lower lobectomy with biopsy proven nodal recurrence. EXAM: CT CHEST, ABDOMEN, AND PELVIS WITH CONTRAST TECHNIQUE: Multidetector CT imaging of the chest, abdomen and pelvis was performed following the standard protocol during bolus administration of intravenous contrast. CONTRAST:  159m OMNIPAQUE IOHEXOL 300 MG/ML SOLN, additional oral enteric contrast COMPARISON:  PET-CT, 08/23/2019, CT chest, 08/13/2019 FINDINGS: CT CHEST FINDINGS Cardiovascular: Scattered aortic  atherosclerosis. Normal heart size. No pericardial effusion. Mediastinum/Nodes: No significant interval change in enlarged left hilar and subcarinal lymph nodes, measuring up to 2.4 x 1.9 cm (series 2, image 25). Thyroid gland, trachea, and esophagus demonstrate no significant findings. Lungs/Pleura: Stable 4 mm nodule of the posterior right upper lobe (series 4, image 34). Stable 3 mm pulmonary nodule of the posterior left apex (series 4, image 33). No pleural effusion or pneumothorax. Musculoskeletal: No chest wall mass or suspicious bone lesions identified. CT ABDOMEN PELVIS FINDINGS Hepatobiliary: No solid liver abnormality is seen. Hepatic steatosis. No gallstones, gallbladder wall thickening, or biliary dilatation. Pancreas: Unremarkable. No pancreatic ductal dilatation or surrounding inflammatory changes. Spleen: Normal in size without significant abnormality. Punctuate calcifications consistent with prior granulomatous infection Adrenals/Urinary Tract: Adrenal glands are unremarkable. Kidneys are normal, without renal calculi, solid lesion, or hydronephrosis. Bladder is unremarkable. Stomach/Bowel: Stomach is within normal limits. Appendix is surgically absent. No evidence of bowel wall thickening, distention, or inflammatory changes. Sigmoid diverticulosis. Vascular/Lymphatic: Scattered aortic atherosclerosis. No enlarged abdominal or pelvic lymph nodes. Reproductive: No mass or other abnormality. Other: No abdominal wall hernia or abnormality. No abdominopelvic ascites. Musculoskeletal: No acute or significant osseous findings. IMPRESSION: 1. No significant interval change in enlarged left hilar and subcarinal lymph nodes, in keeping with biopsy-proven nodal metastatic disease. 2. Stable small, nonspecific pulmonary nodules, most likely sequelae of prior infection or inflammation. Attention on follow-up. 3. No evidence of distant metastatic disease within the abdomen or pelvis. 4. Hepatic steatosis. 5.  Aortic Atherosclerosis (ICD10-I70.0). Electronically Signed   By: AEddie CandleM.D.   On: 10/28/2019 16:40   DG Chest Portable 1 View  Result Date: 10/15/2019 CLINICAL DATA:  Cough and wheezing EXAM: PORTABLE CHEST 1 VIEW COMPARISON:  March 19, 2019 chest radiograph and chest CT August 13, 2019; PET-CT June _0 FINDINGS: Mild volume loss on the left consistent with previous surgery. No edema or airspace opacity. Heart size and pulmonary vascularity are within normal limits. Adenopathy noted on recent PET study  not appreciable by radiography. No bone lesions. IMPRESSION: Mild volume loss on the left. Lungs otherwise clear. Cardiac silhouette normal. No adenopathy appreciable by radiography. Electronically Signed   By: Lowella Grip III M.D.   On: 10/15/2019 15:10    ASSESSMENT: Clinical stage IIIa squamous cell 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  PLAN:    1.  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  He had a complicated postoperative course. CT scan results from August 14, 2019 reviewed independently with a suspicious subcarinal lymph node measuring 1.4 cm.  Follow-up PET scan on August 23, 2019 revealed hypermetabolism in lymph node highly suspicious for recurrence.  Biopsy confirmed recurrence increasing patient's stage from IA up to IIIA.  Will pursue treatment as if patient were a stage IIIa from the beginning and a referral has been made to radiation oncology for consideration of XRT.  Will give weekly carboplatinum and Taxol concurrently with XRT for 6-8 cycles.  At the conclusion of his concurrent chemo/XRT, patient will benefit from 1 year of maintenance immunotherapy using durvalumab.  Prior to initiating treatment he will require port placement.  Return to clinic in 2 weeks for further evaluation and consideration of cycle 1 of concurrent carboplatinum and Taxol. 2.  Pain: Chronic and unrelated to  his malignancy.  Continue monitoring and treatment per pain clinic.  I provided 30 minutes of face-to-face video visit time during this encounter which included chart review, counseling, and coordination of care as documented above.    Patient expressed understanding and was in agreement with this plan. He also understands that He can call clinic at any time with any questions, concerns, or complaints.   Cancer Staging Squamous cell carcinoma of left lung [facility]) Staging form: Lung, AJCC 8th Edition - Clinical stage from 02/12/2019: Stage IIIA (cT1b, cN2, cM0) - Signed by [name], MD on 11/01/2019   [name], MD   11/01/2019 1:45 PM

## 2019-11-01 ENCOUNTER — Inpatient Hospital Stay: Payer: Medicare Other | Attending: Oncology | Admitting: Oncology

## 2019-11-01 ENCOUNTER — Encounter: Payer: Self-pay | Admitting: Oncology

## 2019-11-01 ENCOUNTER — Other Ambulatory Visit: Payer: Self-pay

## 2019-11-01 VITALS — BP 130/89 | HR 80 | Temp 97.8°F | Wt 235.6 lb

## 2019-11-01 DIAGNOSIS — C3492 Malignant neoplasm of unspecified part of left bronchus or lung: Secondary | ICD-10-CM

## 2019-11-01 DIAGNOSIS — Z7189 Other specified counseling: Secondary | ICD-10-CM | POA: Diagnosis not present

## 2019-11-01 MED ORDER — ONDANSETRON HCL 8 MG PO TABS: 8.0000 mg | ORAL_TABLET | Freq: Two times a day (BID) | ORAL | 1 refills | Status: DC | PRN

## 2019-11-01 MED ORDER — PROCHLORPERAZINE MALEATE 10 MG PO TABS: 10.0000 mg | ORAL_TABLET | Freq: Four times a day (QID) | ORAL | 2 refills | Status: DC | PRN

## 2019-11-01 MED ORDER — LIDOCAINE-PRILOCAINE 2.5-2.5 % EX CREA: TOPICAL_CREAM | CUTANEOUS | 3 refills | Status: DC

## 2019-11-01 NOTE — Progress Notes (Signed)
START ON PATHWAY REGIMEN - Non-Small Cell Lung     Administer weekly:     Paclitaxel      Carboplatin   **Always confirm dose/schedule in your pharmacy ordering system**  Patient Characteristics: Preoperative or Nonsurgical Candidate (Clinical Staging), Stage III - Nonsurgical Candidate (Nonsquamous and Squamous), PS = 0, 1 Therapeutic Status: Preoperative or Nonsurgical Candidate (Clinical Staging) AJCC T Category: cT1b AJCC N Category: cN2 AJCC M Category: cM0 AJCC 8 Stage Grouping: IIIA ECOG Performance Status: 0 Intent of Therapy: Curative Intent, Discussed with Patient

## 2019-11-01 NOTE — Telephone Encounter (Signed)
Pt has been r/s for 11/26/2019

## 2019-11-01 NOTE — Progress Notes (Signed)
Patient here today for follow up regarding lung cancer, has questions regarding what plan will be moving forward.

## 2019-11-02 ENCOUNTER — Encounter: Payer: Self-pay | Admitting: Radiation Oncology

## 2019-11-02 ENCOUNTER — Other Ambulatory Visit: Payer: Self-pay

## 2019-11-02 DIAGNOSIS — G4733 Obstructive sleep apnea (adult) (pediatric): Secondary | ICD-10-CM | POA: Diagnosis not present

## 2019-11-03 ENCOUNTER — Ambulatory Visit
Admission: RE | Admit: 2019-11-03 | Discharge: 2019-11-03 | Disposition: A | Discharge status: Home / Self Care | Payer: Medicare Other | Source: Ambulatory Visit | Attending: Radiation Oncology | Admitting: Radiation Oncology

## 2019-11-03 VITALS — BP 132/84 | HR 84 | Temp 97.6°F | Resp 18

## 2019-11-03 DIAGNOSIS — C3432 Malignant neoplasm of lower lobe, left bronchus or lung: Secondary | ICD-10-CM | POA: Diagnosis not present

## 2019-11-03 DIAGNOSIS — C771 Secondary and unspecified malignant neoplasm of intrathoracic lymph nodes: Secondary | ICD-10-CM | POA: Diagnosis not present

## 2019-11-03 DIAGNOSIS — C3492 Malignant neoplasm of unspecified part of left bronchus or lung: Secondary | ICD-10-CM

## 2019-11-03 NOTE — Consult Note (Signed)
NEW PATIENT EVALUATION  Name: Kristopher [name]  MRN: [id number]  Date:   11/03/2019     DOB: December 01, 1960   This 59 y.o. male patient presents to the clinic for initial evaluation of stage IIIa squamous cell carcinoma of the left lung in patient status post left lower lobectomy.  REFERRING PHYSICIAN: [name], DO  CHIEF COMPLAINT:  Chief Complaint  Patient presents with  . Lung Cancer    Initial consultation    DIAGNOSIS: The encounter diagnosis was Squamous cell carcinoma of left lung ([place]).   PREVIOUS INVESTIGATIONS:  Pathology report reviewed Serial CT scans and PET CT scans reviewed Clinical notes reviewed  HPI: Patient is a pleasant 58 year old male who presented on CT scan for screening with a 1 cm lesion in the left lower lobe suspicious for malignancy.  Underwent a left lower lobectomy by Dr. Faith Rogue showing a 1.5 cm squamous cell carcinoma with margins clear.  8 lymph nodes were submitted all negative.  Initial PET CT scan was negative for any evidence of hilar or mediastinal hypermetabolic activity.  Patient did have postoperative complications although did well over the first 2 months post surgery.  On a subsequent follow-up CT scan in June 21 there was hypermetabolic activity in lymph node in the carinal area highly suspicious for recurrence.  Patient underwent bronchoscopy again positive for squamous cell carcinoma of the subcarinal node.  This upstaged patient's stage from 1 A2 3A.  He has been seen by medical oncology with plans for concurrent chemoradiation therapy.  He will receiving carboplatinum and Taxol.  Patient also benefit from immunotherapy after completion of concurrent chemoradiation he is seen today and doing well he is asymptomatic specifically [place] cough hemoptysis chest tightness or any dysphagia.  PLANNED TREATMENT REGIMEN: Concurrent chemoradiation  PAST MEDICAL HISTORY:  has a past medical history of Allergy, Arthritis, Benign hypertensive kidney  disease, Chronic back pain, Diabetes mellitus, type 2 ([place]), Dyspnea, GERD (gastroesophageal reflux disease), Hypertension, Malignant neoplasm of lung ([place]), and Migraines.    PAST SURGICAL HISTORY:  Past Surgical History:  Procedure Laterality Date  . APPENDECTOMY    . BACK SURGERY    . COLONOSCOPY WITH PROPOFOL N/A 02/19/2016   Procedure: COLONOSCOPY WITH PROPOFOL;  Surgeon: Lucilla Lame, MD;  Location: [facility];  Service: Endoscopy;  Laterality: N/A;  . COLONOSCOPY WITH PROPOFOL N/A 01/19/2018   Procedure: COLONOSCOPY WITH PROPOFOL;  Surgeon: Lucilla Lame, MD;  Location: [facility];  Service: Endoscopy;  Laterality: N/A;  Diabetic - oral meds  . DG OPERATIVE LEFT HIP ([place] HX)     10/19  . ELECTROMAGNETIC NAVIGATION BROCHOSCOPY Left 11/18/2018   Procedure: ELECTROMAGNETIC NAVIGATION BRONCHOSCOPY;  Surgeon: Tyler Pita, MD;  Location: [facility];  Service: Cardiopulmonary;  Laterality: Left;  . FLEXIBLE BRONCHOSCOPY Bilateral 01/20/2019   Procedure: FLEXIBLE BRONCHOSCOPY;  Surgeon: Ottie Glazier, MD;  Location: [facility];  Service: Thoracic;  Laterality: Bilateral;  . FLEXIBLE BRONCHOSCOPY Bilateral 01/22/2019   Procedure: FLEXIBLE BRONCHOSCOPY;  Surgeon: Ottie Glazier, MD;  Location: [facility];  Service: Thoracic;  Laterality: Bilateral;  . FOOT SURGERY Left    Screws and plates  . JOINT REPLACEMENT Left 12/2017   DR Rudene Christians Hip  . KNEE SURGERY Left    X 2  . LEG SURGERY    . LUNG CANCER SURGERY    . POLYPECTOMY N/A 02/19/2016   Procedure: POLYPECTOMY;  Surgeon: Lucilla Lame, MD;  Location: [facility];  Service: Endoscopy;  Laterality: N/A;  . POLYPECTOMY  01/19/2018   Procedure: POLYPECTOMY;  Surgeon: Lucilla Lame, MD;  Location: [facility];  Service: Endoscopy;;  . THORACOTOMY Left 01/14/2019   Procedure: THORACOTOMY MAJOR, LEFT;  Surgeon: Nestor Lewandowsky, MD;  Location: [facility];  Service: General;  Laterality: Left;  . TOTAL HIP  ARTHROPLASTY Left 12/02/2017   Procedure: TOTAL HIP ARTHROPLASTY ANTERIOR APPROACH;  Surgeon: Hessie Knows, MD;  Location: [facility];  Service: Orthopedics;  Laterality: Left;  [name] VIDEO BRONCHOSCOPY Left 01/14/2019   Procedure: VIDEO BRONCHOSCOPY WITH FLUORO, LEFT;  Surgeon: Nestor Lewandowsky, MD;  Location: [facility];  Service: General;  Laterality: Left;  [name] VIDEO BRONCHOSCOPY WITH ENDOBRONCHIAL NAVIGATION N/A 10/15/2019   Procedure: VIDEO BRONCHOSCOPY WITH ENDOBRONCHIAL NAVIGATION;  Surgeon: Ottie Glazier, MD;  Location: [facility];  Service: Thoracic;  Laterality: N/A;  . VIDEO BRONCHOSCOPY WITH ENDOBRONCHIAL ULTRASOUND N/A 10/15/2019   Procedure: VIDEO BRONCHOSCOPY WITH ENDOBRONCHIAL ULTRASOUND;  Surgeon: Ottie Glazier, MD;  Location: [facility];  Service: Thoracic;  Laterality: N/A;    FAMILY HISTORY: family history includes Cancer in his father; Diabetes in his sister; Thrombosis in his sister.  SOCIAL HISTORY:  reports that he has been smoking cigarettes. He has a 1.75 pack-year smoking history. He has never used smokeless tobacco. He reports current drug use. Drug: Oxycodone. He reports that he does not drink alcohol.  ALLERGIES: Acetaminophen, Aspirin, Epinephrine, Novocain [procaine], Penicillins, Strawberry extract, and Shellfish allergy  MEDICATIONS:  Current Outpatient Medications  Medication Sig Dispense Refill  . Accu-Chek FastClix Lancets MISC USE TO TEST BLOOD SUGAR 2X A DAY 100 each 12  . ACCU-CHEK GUIDE test strip USE TO TEST BLOOD SUGAR 2X A DAY 100 strip 12  . amLODipine (NORVASC) 5 MG tablet Take 1 tablet (5 mg total) by mouth 2 (two) times daily. 180 tablet 1  . ARNUITY ELLIPTA 100 MCG/ACT AEPB Inhale 1 puff into the lungs daily.    [name] atorvastatin (LIPITOR) 10 MG tablet Take 1 tablet (10 mg total) by mouth daily at 6 PM. 90 tablet 1  . BREZTRI AEROSPHERE 160-9-4.8 MCG/ACT AERO INHALE 2 INHALATIONS INTO THE LUNGS 2 (TWO) TIMES DAILY    . Dulaglutide (TRULICITY) 1.5 KV/4.2VZ SOPN  Inject 0.5 mLs (1.5 mg total) into the skin once a week. 4 pen 3  . esomeprazole (NEXIUM) 40 MG capsule Take 1 capsule (40 mg total) by mouth daily. 90 capsule 3  . hydrALAZINE (APRESOLINE) 100 MG tablet Take 1 tablet (100 mg total) by mouth 2 (two) times daily. 180 tablet 1  . lidocaine (LIDODERM) 5 % Place 1 patch onto the skin daily. Remove & Discard patch within 12 hours or as directed by MD (Patient taking differently: Place 1 patch onto the skin daily as needed (pain). Remove & Discard patch within 12 hours or as directed by MD) 30 patch 12  . lidocaine-prilocaine (EMLA) cream Apply to affected area once 30 g 3  . metFORMIN (GLUCOPHAGE) 500 MG tablet Take 2 tablets (1,000 mg total) by mouth 2 (two) times daily with a meal. 360 tablet 1  . metoprolol tartrate (LOPRESSOR) 50 MG tablet Take 50 mg by mouth 2 (two) times daily.    . montelukast (SINGULAIR) 10 MG tablet Take 10 mg by mouth daily.    [name] morphine (MS CONTIN) 30 MG 12 hr tablet Take 1 tablet (30 mg total) by mouth every 12 (twelve) hours. Must last 30 days. Do not break tablet 60 tablet 0  . NONFORMULARY OR COMPOUNDED ITEM Apply 1-2 mLs topically 4 (four) times daily  as needed. 10% Ketamine/2% Cyclobenzaprine/6% Gabapentin Cream 240 each 6  . nortriptyline (PAMELOR) 25 MG capsule TAKE 2 CAPSULES (50 MG TOTAL) BY MOUTH AT BEDTIME. 180 capsule 1  . ondansetron (ZOFRAN) 8 MG tablet Take 1 tablet (8 mg total) by mouth 2 (two) times daily as needed for refractory nausea / vomiting. 60 tablet 1  . pantoprazole (PROTONIX) 40 MG tablet Take 1 tablet (40 mg total) by mouth daily. 15 tablet 2  . Potassium Chloride ER 20 MEQ TBCR TAKE 1 TABLET BY MOUTH EVERY DAY (Patient taking differently: Take 20 mEq by mouth daily. ) 90 tablet 1  . prochlorperazine (COMPAZINE) 10 MG tablet Take 1 tablet (10 mg total) by mouth every 6 (six) hours as needed (Nausea or vomiting). 60 tablet 2  . STIOLTO RESPIMAT 2.5-2.5 MCG/ACT AERS SMARTSIG:2 Puff(s) Via Inhaler  Daily    . SUMAtriptan (IMITREX) 50 MG tablet TAKE 1 TAB AT ONSET OF MIGRAINE. MAY REPEAT IN 2 HOURS IF HEADACHE PERSISTS OR RECURS. (Patient taking differently: Take 50 mg by mouth every 2 (two) hours as needed for migraine. Take 1 tab at onset of migraine. May repeat in 2 hours if headache persists or recurs.) 10 tablet 0   No current facility-administered medications for this encounter.   Facility-Administered Medications Ordered in Other Encounters  Medication Dose Route Frequency Provider Last Rate Last Admin  . 0.9 %  sodium chloride infusion    Continuous PRN Dionne Bucy, CRNA   New Bag at 01/22/19 0705    ECOG PERFORMANCE STATUS:  0 - Asymptomatic  REVIEW OF SYSTEMS: Patient denies any weight loss, fatigue, weakness, fever, chills or night sweats. Patient denies any loss of vision, blurred vision. Patient denies any ringing  of the ears or hearing loss. No irregular heartbeat. Patient denies heart murmur or history of fainting. Patient denies any chest pain or pain radiating to her upper extremities. Patient denies any shortness of breath, difficulty breathing at night, cough or hemoptysis. Patient denies any swelling in the lower legs. Patient denies any nausea vomiting, vomiting of blood, or coffee ground material in the vomitus. Patient denies any stomach pain. Patient states has had normal bowel movements no significant constipation or diarrhea. Patient denies any dysuria, hematuria or significant nocturia. Patient denies any problems walking, swelling in the joints or loss of balance. Patient denies any skin changes, loss of hair or loss of weight. Patient denies any excessive worrying or anxiety or significant depression. Patient denies any problems with insomnia. Patient denies excessive thirst, polyuria, polydipsia. Patient denies any swollen glands, patient denies easy bruising or easy bleeding. Patient denies any recent infections, allergies or URI. Patient "s visual fields have  not changed significantly in recent time.   PHYSICAL EXAM: BP 132/84   Pulse 84   Temp 97.6 F (36.4 C) (Tympanic)   Resp 18   SpO2 99%  Well-developed male in NAD.  Well-developed well-nourished patient in NAD. HEENT reveals PERLA, EOMI, discs not visualized.  Oral cavity is clear. No oral mucosal lesions are identified. Neck is clear without evidence of cervical or supraclavicular adenopathy. Lungs are clear to A&P. Cardiac examination is essentially unremarkable with regular rate and rhythm without murmur rub or thrill. Abdomen is benign with no organomegaly or masses noted. Motor sensory and DTR levels are equal and symmetric in the upper and lower extremities. Cranial nerves II through XII are grossly intact. Proprioception is intact. No peripheral adenopathy or edema is identified. No motor or sensory levels are noted.  Crude visual fields are within normal range.  LABORATORY DATA: Pathology and cytology report reviewed compatible with above-stated findings    RADIOLOGY RESULTS: Serial CT scans and PET CT scans reviewed and compatible with above-stated findings   IMPRESSION: Stage IIIa squamous cell carcinoma of the left lower lobe in 59 year old male status post left lower lobectomy now with mediastinal recurrence.  PLAN: At this time elected ahead with radiation therapy to the areas of PET positive lymph nodes.  Along with concurrent chemotherapy.  I would plan on delivering 70 Gray over 7 weeks using IMRT treatment planning and delivery.  I would choose IMRT to spare critical structures such as the nearby esophagus spinal cord heart and normal lung volume.  Risks and benefits of treatment occluding possible radiation esophagitis fatigue alteration of blood counts production of cough skin reaction all were discussed in detail with the patient and his wife.  They both seem to comprehend my treatment plan well.  I have personally set up and ordered CT simulation for early next week.  We will  coordinate his chemotherapy.  There will be extra effort by both professional staff as well as technical staff to coordinate and manage concurrent chemoradiation and ensuing side effects during his treatments.   I would like to take this opportunity to thank you for allowing me to participate in the care of your patient.Noreene Filbert, MD

## 2019-11-05 ENCOUNTER — Encounter: Payer: Self-pay | Admitting: Cardiothoracic Surgery

## 2019-11-05 ENCOUNTER — Telehealth: Payer: Self-pay | Admitting: Cardiothoracic Surgery

## 2019-11-05 ENCOUNTER — Other Ambulatory Visit: Payer: Self-pay

## 2019-11-05 ENCOUNTER — Ambulatory Visit (INDEPENDENT_AMBULATORY_CARE_PROVIDER_SITE_OTHER): Payer: Medicare Other | Admitting: Cardiothoracic Surgery

## 2019-11-05 VITALS — BP 152/103 | HR 80 | Temp 97.9°F | Ht 70.0 in | Wt 231.8 lb

## 2019-11-05 DIAGNOSIS — C3490 Malignant neoplasm of unspecified part of unspecified bronchus or lung: Secondary | ICD-10-CM | POA: Diagnosis not present

## 2019-11-05 NOTE — Telephone Encounter (Signed)
Pt has been advised of Pre-Admission date/time, COVID Testing date and Surgery date.  Surgery Date: 11/11/19 Preadmission Testing Date: 11/10/19 (phone 8a-1p) Covid Testing Date: 11/10/19 - patient advised to go to the [place] [facility] between 8a-1p   Patient has been made aware to call [phone], between 1-3:00pm the day before surgery, to find out what time to arrive for surgery.

## 2019-11-05 NOTE — Patient Instructions (Signed)
We have seen you today and have spoken about your port placement. This will be done by Dr Genevive Bi.  Please see the [facility]) Sheet provided for further details. Our surgery scheduler will call you to go over surgery date and information.   Please call our office with any questions or concerns that you have.   [facility]) A central line is a soft, flexible tube (catheter) that can be used to collect blood for testing or to give medicine or nutrition through a vein. The tip of the central line ends in a large vein just above the heart called the vena cava. A central line may be placed because:  You need to get medicines or fluids through an IV tube for a long period of time.  You need nutrition but cannot eat or absorb nutrients.  The veins in your hands or arms are hard to access.  You need to have blood taken often for blood tests.  You need a blood transfusion  You need chemotherapy or dialysis.  There are many types of central lines:  Peripherally inserted central catheter (PICC) line. This type is used for intermediate access to long-term access of one week or more. It can be used to draw blood and give fluids or medicines. A PICC looks like an IV tube, but it goes up the arm to the heart. It is usually inserted in the upper arm and taped in place on the arm.  Tunneled central line. This type is used for long-term therapy and dialysis. It is placed in a large vein in the neck, chest, or groin. A tunneled central line is inserted through a small incision made over the vein and is advanced into the heart. It is tunneled beneath the skin and brought out through a second incision.  Non-tunneled central line. This type is used for short-term access, usually of a maximum of 7 days. It is often used in the emergency department. A non-tunneled central line is inserted in the neck, chest, or groin.  Implanted port. This type is used for long-term therapy. It can stay in  place longer than other types of central lines. An implanted port is normally inserted in the upper chest but can also be placed in the upper arm or in the abdomen. It is inserted and removed with surgery, and it is accessed using a special needle.  The type of central line that you receive depends on how long you will need it, your medical condition, and the condition of your veins. What are the risks? Using any type of central line has risks that you should be aware of, including:  Infection.  A blood clot that blocks the central line or forms in the vein and travels to the heart.  Bleeding from the place where the central line was put in.  Developing a hole or crack within the central line. If this happens, the central line will need to be replaced.  Developing an abnormal heart rhythm (arrhythmia). This is rare.  Central line failure.  Follow these instructions at home: Flushing and cleaning the central line  Follow instructions from the health care provider about flushing and cleaning the central line.  Wear a mask when flushing or cleaning the central line.  Before you flush or clean the central line: ? Wash your hands with soap and water. ? Clean the central line hub with rubbing alcohol. Insertion site care  Keep the insertion site of your central line clean and  dry at all times.  Check your incision or central line site every day for signs of infection. Check for: ? More redness, swelling, or pain. ? More fluid or blood. ? Warmth. ? Pus or a bad smell. General instructions  Follow instructions from your health care provider for the type of device that you have.  If the central line accidentally gets pulled on, make sure: ? The bandage (dressing) is okay. ? There is no bleeding. ? The line has not been pulled out.  Return to your normal activities as told by your health care provider. Ask your health care provider what activities are safe for you. You may be  restricted from lifting or making repetitive arm movements on the side with the catheter.  Do not swim or bathe unless your health care provider approves.  Keep your dressing dry. Your health care provider can instruct you about how to keep your specific type of dressing from getting wet.  Keep all follow-up visits as told by your health care provider. This is important. Contact a health care provider if:  You have more redness, swelling, or pain around your incision.  You have more fluid or blood coming from your incision.  Your incision feels warm to the touch.  You have pus or a bad smell coming from your incision. Get help right away if:  You have: ? Chills. ? A fever. ? Shortness of breath. ? Trouble breathing. ? Chest pain. ? Swelling in your neck, face, chest, or arm on the side of your central line.  You are coughing.  You feel your heart beating rapidly or skipping beats.  You feel dizzy or you faint.  Your incision or central line site has red streaks spreading away from the area.  Your incision or central line site is bleeding and does not stop.  Your central line is difficult to flush or will not flush.  You do not get a blood return from the central line.  Your central line gets loose or comes out.  Your central line gets damaged.  Your catheter leaks when flushed or when fluids are infused into it. This information is not intended to replace advice given to you by your health care provider. Make sure you discuss any questions you have with your health care provider. Document Released: 04/11/2005 Document Revised: 10/18/2015 Document Reviewed: 09/27/2015 Elsevier Interactive Patient Education  2017 [employer].

## 2019-11-05 NOTE — Progress Notes (Signed)
Patient ID: Kristopher [name], male   DOB: Jan 10, 1961, 59 y.o.   MRN: [id number]  Chief Complaint  Patient presents with  . Follow-up    Lung Cancer    Referred By Dr. Delight Hoh Reason for Referral Port-A-Cath placement  HPI Location, Quality, Duration, Severity, Timing, Context, Modifying Factors, Associated Signs and Symptoms.  Kristopher [name] is a 59 y.o. male.  This patient is a 59 year old gentleman who underwent a left lower lobectomy for a stage I carcinoma of the lung.  Unfortunately he has developed a recurrence in his mediastinum with a recent bronchoscopy showing metastatic disease in the subcarinal nodes.  He is been extensively evaluated and found to be a good candidate for radiation therapy and chemotherapy.  As part of his chemotherapy he requires a port placement and he comes in today to discuss that.  The patient states that he is not short of breath.  He states he has no pain in that he is able to do all the activities of daily living.  He does not take any blood thinners and has not had any Covid symptoms.   Past Medical History:  Diagnosis Date  . Allergy   . Arthritis    left foot  . Benign hypertensive kidney disease   . Chronic back pain    Four rods in back  . Diabetes mellitus, type 2 ([facility])   . Dyspnea   . GERD (gastroesophageal reflux disease)   . Hypertension   . Malignant neoplasm of lung ([place])   . Migraines    daily    Past Surgical History:  Procedure Laterality Date  . APPENDECTOMY    . BACK SURGERY    . COLONOSCOPY WITH PROPOFOL N/A 02/19/2016   Procedure: COLONOSCOPY WITH PROPOFOL;  Surgeon: Lucilla Lame, MD;  Location: [place];  Service: Endoscopy;  Laterality: N/A;  . COLONOSCOPY WITH PROPOFOL N/A 01/19/2018   Procedure: COLONOSCOPY WITH PROPOFOL;  Surgeon: Lucilla Lame, MD;  Location: [place];  Service: Endoscopy;  Laterality: N/A;  Diabetic - oral meds  . DG OPERATIVE LEFT HIP ([place] [facility])     10/19  .  ELECTROMAGNETIC NAVIGATION BROCHOSCOPY Left 11/18/2018   Procedure: ELECTROMAGNETIC NAVIGATION BRONCHOSCOPY;  Surgeon: Tyler Pita, MD;  Location: [facility];  Service: Cardiopulmonary;  Laterality: Left;  . FLEXIBLE BRONCHOSCOPY Bilateral 01/20/2019   Procedure: FLEXIBLE BRONCHOSCOPY;  Surgeon: Ottie Glazier, MD;  Location: [facility];  Service: Thoracic;  Laterality: Bilateral;  . FLEXIBLE BRONCHOSCOPY Bilateral 01/22/2019   Procedure: FLEXIBLE BRONCHOSCOPY;  Surgeon: Ottie Glazier, MD;  Location: [facility];  Service: Thoracic;  Laterality: Bilateral;  . FOOT SURGERY Left    Screws and plates  . JOINT REPLACEMENT Left 12/2017   DR Rudene Christians Hip  . KNEE SURGERY Left    X 2  . LEG SURGERY    . LUNG CANCER SURGERY    . POLYPECTOMY N/A 02/19/2016   Procedure: POLYPECTOMY;  Surgeon: Lucilla Lame, MD;  Location: [place];  Service: Endoscopy;  Laterality: N/A;  . POLYPECTOMY  01/19/2018   Procedure: POLYPECTOMY;  Surgeon: Lucilla Lame, MD;  Location: [place];  Service: Endoscopy;;  . THORACOTOMY Left 01/14/2019   Procedure: THORACOTOMY MAJOR, LEFT;  Surgeon: Nestor Lewandowsky, MD;  Location: [facility];  Service: General;  Laterality: Left;  . TOTAL HIP ARTHROPLASTY Left 12/02/2017   Procedure: TOTAL HIP ARTHROPLASTY ANTERIOR APPROACH;  Surgeon: Hessie Knows, MD;  Location: [facility];  Service: Orthopedics;  Laterality: Left;  [name] VIDEO BRONCHOSCOPY  Left 01/14/2019   Procedure: VIDEO BRONCHOSCOPY WITH FLUORO, LEFT;  Surgeon: Nestor Lewandowsky, MD;  Location: [facility];  Service: General;  Laterality: Left;  [name] VIDEO BRONCHOSCOPY WITH ENDOBRONCHIAL NAVIGATION N/A 10/15/2019   Procedure: VIDEO BRONCHOSCOPY WITH ENDOBRONCHIAL NAVIGATION;  Surgeon: Ottie Glazier, MD;  Location: [facility];  Service: Thoracic;  Laterality: N/A;  . VIDEO BRONCHOSCOPY WITH ENDOBRONCHIAL ULTRASOUND N/A 10/15/2019   Procedure: VIDEO BRONCHOSCOPY WITH ENDOBRONCHIAL ULTRASOUND;  Surgeon: Ottie Glazier, MD;   Location: [facility];  Service: Thoracic;  Laterality: N/A;    Family History  Problem Relation Age of Onset  . Cancer Father   . Diabetes Sister   . Thrombosis Sister     Social History Social History   Tobacco Use  . Smoking status: Current Some Day Smoker    Packs/day: 0.05    Years: 35.00    Pack years: 1.75    Types: Cigarettes  . Smokeless tobacco: Never Used  . Tobacco comment: 1 pack last 3 days   Vaping Use  . Vaping Use: Never used  Substance Use Topics  . Alcohol use: No    Alcohol/week: 0.0 standard drinks  . Drug use: Yes    Types: Oxycodone    Comment: prescribed    Allergies  Allergen Reactions  . Acetaminophen Swelling  . Aspirin Anaphylaxis  . Epinephrine Anaphylaxis    Does not include albuterol  . Novocain [Procaine] Anaphylaxis  . Penicillins Anaphylaxis    Has patient had a PCN reaction causing immediate rash, facial/tongue/throat swelling, SOB or lightheadedness with hypotension: Yes Has patient had a PCN reaction causing severe rash involving mucus membranes or skin necrosis: No Has patient had a PCN reaction that required hospitalization: Yes Has patient had a PCN reaction occurring within the last 10 years: No If all of the above answers are "NO", then may proceed with Cephalosporin use.   . Strawberry Extract Anaphylaxis  . Shellfish Allergy Hives and Nausea And Vomiting    Current Outpatient Medications  Medication Sig Dispense Refill  . Accu-Chek FastClix Lancets MISC USE TO TEST BLOOD SUGAR 2X A DAY 100 each 12  . ACCU-CHEK GUIDE test strip USE TO TEST BLOOD SUGAR 2X A DAY 100 strip 12  . amLODipine (NORVASC) 5 MG tablet Take 1 tablet (5 mg total) by mouth 2 (two) times daily. 180 tablet 1  . ARNUITY ELLIPTA 100 MCG/ACT AEPB Inhale 1 puff into the lungs daily.    [name] atorvastatin (LIPITOR) 10 MG tablet Take 1 tablet (10 mg total) by mouth daily at 6 PM. 90 tablet 1  . BREZTRI AEROSPHERE 160-9-4.8 MCG/ACT AERO INHALE 2 INHALATIONS INTO  THE LUNGS 2 (TWO) TIMES DAILY    . Dulaglutide (TRULICITY) 1.5 TO/6.7TI SOPN Inject 0.5 mLs (1.5 mg total) into the skin once a week. 4 pen 3  . esomeprazole (NEXIUM) 40 MG capsule Take 1 capsule (40 mg total) by mouth daily. 90 capsule 3  . hydrALAZINE (APRESOLINE) 100 MG tablet Take 1 tablet (100 mg total) by mouth 2 (two) times daily. 180 tablet 1  . lidocaine (LIDODERM) 5 % Place 1 patch onto the skin daily. Remove & Discard patch within 12 hours or as directed by MD (Patient taking differently: Place 1 patch onto the skin daily as needed (pain). Remove & Discard patch within 12 hours or as directed by MD) 30 patch 12  . lidocaine-prilocaine (EMLA) cream Apply to affected area once 30 g 3  . metFORMIN (GLUCOPHAGE) 500 MG tablet Take 2 tablets (1,000  mg total) by mouth 2 (two) times daily with a meal. 360 tablet 1  . metoprolol tartrate (LOPRESSOR) 50 MG tablet Take 50 mg by mouth 2 (two) times daily.    . montelukast (SINGULAIR) 10 MG tablet Take 10 mg by mouth daily.    [name] morphine (MS CONTIN) 30 MG 12 hr tablet Take 1 tablet (30 mg total) by mouth every 12 (twelve) hours. Must last 30 days. Do not break tablet 60 tablet 0  . NONFORMULARY OR COMPOUNDED ITEM Apply 1-2 mLs topically 4 (four) times daily as needed. 10% Ketamine/2% Cyclobenzaprine/6% Gabapentin Cream 240 each 6  . nortriptyline (PAMELOR) 25 MG capsule TAKE 2 CAPSULES (50 MG TOTAL) BY MOUTH AT BEDTIME. 180 capsule 1  . ondansetron (ZOFRAN) 8 MG tablet Take 1 tablet (8 mg total) by mouth 2 (two) times daily as needed for refractory nausea / vomiting. 60 tablet 1  . pantoprazole (PROTONIX) 40 MG tablet Take 1 tablet (40 mg total) by mouth daily. 15 tablet 2  . Potassium Chloride ER 20 MEQ TBCR TAKE 1 TABLET BY MOUTH EVERY DAY (Patient taking differently: Take 20 mEq by mouth daily. ) 90 tablet 1  . prochlorperazine (COMPAZINE) 10 MG tablet Take 1 tablet (10 mg total) by mouth every 6 (six) hours as needed (Nausea or vomiting). 60 tablet  2  . STIOLTO RESPIMAT 2.5-2.5 MCG/ACT AERS SMARTSIG:2 Puff(s) Via Inhaler Daily    . SUMAtriptan (IMITREX) 50 MG tablet TAKE 1 TAB AT ONSET OF MIGRAINE. MAY REPEAT IN 2 HOURS IF HEADACHE PERSISTS OR RECURS. (Patient taking differently: Take 50 mg by mouth every 2 (two) hours as needed for migraine. Take 1 tab at onset of migraine. May repeat in 2 hours if headache persists or recurs.) 10 tablet 0   No current facility-administered medications for this visit.   Facility-Administered Medications Ordered in Other Visits  Medication Dose Route Frequency Provider Last Rate Last Admin  . 0.9 %  sodium chloride infusion    Continuous PRN Dionne Bucy, CRNA   New Bag at 01/22/19 5427      Review of Systems A complete review of systems was asked and was negative except for the following positive findings none  Blood pressure (!) 152/103, pulse 80, temperature 97.9 F (36.6 C), height _0  (1.778 m), weight 231 lb 12.8 oz (105.1 kg), SpO2 97 %.  Physical Exam CONSTITUTIONAL:  Pleasant, well-developed, well-nourished, and in no acute distress. EYES: Pupils equal and reactive to light, Sclera non-icteric EARS, NOSE, MOUTH AND THROAT:  The oropharynx was clear.  Dentition is good repair.  Oral mucosa pink and moist. LYMPH NODES:  Lymph nodes in the neck and axillae were normal RESPIRATORY:  Lungs were clear.  Normal respiratory effort without pathologic use of accessory muscles of respiration CARDIOVASCULAR: Heart was regular without murmurs.  There were no carotid bruits. SKIN:  There were no pathologic skin lesions.  There were no nodules on palpation. NEUROLOGIC:  Sensation is normal.  Cranial nerves are grossly intact. PSYCH:  Oriented to person, place and time.  Mood and affect are normal.  Data Reviewed Notes  I have personally reviewed the patient's imaging, laboratory findings and medical records.    Assessment    Metastatic lung cancer    Plan    I reviewed with him the  indications and risks of Port-A-Cath placement.  Risks of bleeding, infection, pneumothorax and death were all reviewed.  We will schedule as soon as possible for next week  Nestor Lewandowsky, MD 11/05/2019, 9:04 AM

## 2019-11-05 NOTE — Addendum Note (Signed)
Addended by: Louis Matte on: 11/05/2019 09:28 AM   Modules accepted: Orders, SmartSet

## 2019-11-09 ENCOUNTER — Ambulatory Visit
Admission: RE | Admit: 2019-11-09 | Discharge: 2019-11-09 | Disposition: A | Discharge status: Home / Self Care | Payer: Medicare Other | Source: Ambulatory Visit | Attending: Radiation Oncology | Admitting: Radiation Oncology

## 2019-11-09 DIAGNOSIS — C3432 Malignant neoplasm of lower lobe, left bronchus or lung: Secondary | ICD-10-CM | POA: Insufficient documentation

## 2019-11-09 DIAGNOSIS — Z510 Encounter for antineoplastic radiation therapy: Secondary | ICD-10-CM | POA: Diagnosis not present

## 2019-11-09 DIAGNOSIS — C771 Secondary and unspecified malignant neoplasm of intrathoracic lymph nodes: Secondary | ICD-10-CM | POA: Diagnosis not present

## 2019-11-10 ENCOUNTER — Ambulatory Visit
Admission: RE | Admit: 2019-11-10 | Discharge: 2019-11-10 | Disposition: A | Discharge status: Home / Self Care | Payer: Medicare Other | Source: Ambulatory Visit | Attending: Cardiothoracic Surgery | Admitting: Cardiothoracic Surgery

## 2019-11-10 ENCOUNTER — Other Ambulatory Visit: Payer: Self-pay

## 2019-11-10 ENCOUNTER — Other Ambulatory Visit
Admission: RE | Admit: 2019-11-10 | Discharge: 2019-11-10 | Disposition: A | Discharge status: Home / Self Care | Payer: Medicare Other | Source: Ambulatory Visit | Attending: Cardiothoracic Surgery | Admitting: Cardiothoracic Surgery

## 2019-11-10 ENCOUNTER — Encounter
Admission: RE | Admit: 2019-11-10 | Discharge: 2019-11-10 | Disposition: A | Discharge status: Home / Self Care | Payer: Medicare Other | Source: Ambulatory Visit | Attending: Cardiothoracic Surgery | Admitting: Cardiothoracic Surgery

## 2019-11-10 DIAGNOSIS — Z01818 Encounter for other preprocedural examination: Secondary | ICD-10-CM | POA: Diagnosis not present

## 2019-11-10 DIAGNOSIS — Z01810 Encounter for preprocedural cardiovascular examination: Secondary | ICD-10-CM | POA: Insufficient documentation

## 2019-11-10 DIAGNOSIS — Z20822 Contact with and (suspected) exposure to covid-19: Secondary | ICD-10-CM | POA: Diagnosis not present

## 2019-11-10 DIAGNOSIS — I10 Essential (primary) hypertension: Secondary | ICD-10-CM | POA: Diagnosis not present

## 2019-11-10 LAB — CBC WITH DIFFERENTIAL/PLATELET
Abs Immature Granulocytes: 0.02 10*3/uL (ref 0.00–0.07)
Basophils Absolute: 0.1 10*3/uL (ref 0.0–0.1)
Basophils Relative: 1 %
Eosinophils Absolute: 0.1 10*3/uL (ref 0.0–0.5)
Eosinophils Relative: 2 %
HCT: 37.3 % — ABNORMAL LOW (ref 39.0–52.0)
Hemoglobin: 13.2 g/dL (ref 13.0–17.0)
Immature Granulocytes: 0 %
Lymphocytes Relative: 39 %
Lymphs Abs: 2.5 10*3/uL (ref 0.7–4.0)
MCH: 29.9 pg (ref 26.0–34.0)
MCHC: 35.4 g/dL (ref 30.0–36.0)
MCV: 84.4 fL (ref 80.0–100.0)
Monocytes Absolute: 0.5 10*3/uL (ref 0.1–1.0)
Monocytes Relative: 7 %
Neutro Abs: 3.3 10*3/uL (ref 1.7–7.7)
Neutrophils Relative %: 51 %
Platelets: 189 10*3/uL (ref 150–400)
RBC: 4.42 MIL/uL (ref 4.22–5.81)
RDW: 13.3 % (ref 11.5–15.5)
WBC: 6.5 10*3/uL (ref 4.0–10.5)
nRBC: 0 % (ref 0.0–0.2)

## 2019-11-10 LAB — COMPREHENSIVE METABOLIC PANEL
ALT: 58 U/L — ABNORMAL HIGH (ref 0–44)
AST: 67 U/L — ABNORMAL HIGH (ref 15–41)
Albumin: 4.5 g/dL (ref 3.5–5.0)
Alkaline Phosphatase: 132 U/L — ABNORMAL HIGH (ref 38–126)
Anion gap: 13 (ref 5–15)
BUN: 8 mg/dL (ref 6–20)
CO2: 27 mmol/L (ref 22–32)
Calcium: 9.3 mg/dL (ref 8.9–10.3)
Chloride: 98 mmol/L (ref 98–111)
Creatinine, Ser: 0.88 mg/dL (ref 0.61–1.24)
GFR calc Af Amer: >60 mL/min (ref >60)
GFR calc non Af Amer: >60 mL/min (ref >60)
Glucose, Bld: 197 mg/dL — ABNORMAL HIGH (ref 70–99)
Potassium: 2.9 mmol/L — ABNORMAL LOW (ref 3.5–5.1)
Sodium: 138 mmol/L (ref 135–145)
Total Bilirubin: 0.9 mg/dL (ref 0.3–1.2)
Total Protein: 8 g/dL (ref 6.5–8.1)

## 2019-11-10 LAB — PROTIME-INR
INR: 1.1 (ref 0.8–1.2)
Prothrombin Time: 14.1 seconds (ref 11.4–15.2)

## 2019-11-10 LAB — SARS CORONAVIRUS 2 (TAT 6-24 HRS): SARS Coronavirus 2: NEGATIVE

## 2019-11-10 LAB — APTT: aPTT: 30 seconds (ref 24–36)

## 2019-11-10 MED ORDER — VANCOMYCIN HCL 1500 MG/300ML IV SOLN
1500.0000 mg | INTRAVENOUS | Status: AC
  Administered 2019-11-11: 1500 mg via INTRAVENOUS
  Filled 2019-11-10: qty 300

## 2019-11-10 NOTE — Patient Instructions (Signed)
Your procedure is scheduled on: Thursday 11/11/19.  Report to DAY SURGERY DEPARTMENT LOCATED ON 2ND FLOOR MEDICAL MALL ENTRANCE. To find out your arrival time please call [phone] between 1PM - 3PM on Wednesday 11/10/19.   Remember: Instructions that are not followed completely may result in serious medical risk, up to and including death, or upon the discretion of your surgeon and anesthesiologist your surgery may need to be rescheduled.     __X__ 1. Do not eat food after midnight the night before your procedure.                 No gum chewing or hard candies. You may drink clear liquids up to 2 hours                 before you are scheduled to arrive for your surgery- DO NOT drink clear                 liquids within 2 hours of the start of your surgery.                 Clear Liquids include:  water, apple juice without pulp, clear carbohydrate                 drink such as Clearfast or Gatorade, Black Coffee or Tea (Do not add                 milk or creamer to coffee or tea).  __X__2.  On the morning of surgery brush your teeth with toothpaste and water, you may rinse your mouth with mouthwash if you wish.  Do not swallow any toothpaste or mouthwash.    __X__ 3.  No Alcohol for 24 hours before or after surgery.  __X__ 4.  Do Not Smoke or use e-cigarettes For 24 Hours Prior to Your Surgery.                 Do not use any chewable tobacco products for at least 6 hours prior to                 surgery.  __X__5.  Notify your doctor if there is any change in your medical condition      (cold, fever, infections).      Do NOT wear jewelry, make-up, hairpins, clips or nail polish. Do NOT wear lotions, powders, or perfumes.  Do NOT shave 48 hours prior to surgery. Men may shave face and neck. Do NOT bring valuables to the hospital.     [facility] is not responsible for any belongings or valuables.   Contacts, dentures/partials or body piercings may not be worn into surgery. Bring  a case for your contacts, glasses or hearing aids, a denture cup will be supplied.   Patients discharged the day of surgery will not be allowed to drive home.     __X__ Take these medicines the morning of surgery with A SIP OF WATER:     1. amLODipine (NORVASC) 5 MG tablet  2. ARNUITY ELLIPTA 100   3. BREZTRI AEROSPHERE 160-9-4.8 MCG/ACT AERO  4. esomeprazole (NEXIUM)  5. hydrALAZINE (APRESOLINE)  6. metoprolol tartrate (LOPRESSOR)   7. montelukast (SINGULAIR)   8. STIOLTO RESPIMAT 2.5-2.5 MCG/ACT AERS     __X__ Use CHG Soap as directed  __X__ Use inhalers on the day of surgery. Also bring the inhaler with you to the hospital on the morning of surgery.  __X__ Stop metformin/Janumet/Farxiga 2 days prior to  surgery.    __X__ Stop Anti-inflammatories 7 days before surgery such as Advil, Ibuprofen, Motrin, BC or Goodies Powder, Naprosyn, Naproxen, Aleve, Aspirin, Meloxicam. May take Tylenol if needed for pain or discomfort.   __X__Do not start taking any new herbal supplements or vitamins prior to your procedure.    Wear comfortable clothing (specific to your surgery type) to the hospital.  Plan for stool softeners for home use; pain medications have a tendency to cause constipation. You can also help prevent constipation by eating foods high in fiber such as fruits and vegetables and drinking plenty of fluids as your diet allows.  After surgery, you can prevent lung complications by doing breathing exercises.Take deep breaths and cough every 1-2 hours. Your doctor may order a device called an Incentive Spirometer to help you take deep breaths.  Please call the [place] [facility] at [phone] if you have any questions about these instructions.

## 2019-11-10 NOTE — Progress Notes (Signed)
  [facility] Perioperative Services: Pre-Admission/Anesthesia Testing  Abnormal Lab Notification    Date: 11/10/19  Name: [name] MRN:   [id number]  Re: Abnormal labs noted during PAT appointment   Provider(s) Notified: Nestor Lewandowsky, MD Notification mode: Routed and/or faxed via [facility]   ABNORMAL LAB VALUE(S): Lab Results  Component Value Date   K 2.9 (L) 11/10/2019    Notes: Patient does not appear to be on daily diuretic therapy. He is already taking oral potassium supplement (KDUR 20 mEq) daily. Will forward result to surgeon for review and optimization prior to patient's scheduled PAC placement scheduled for 11/11/2019. Will have SDS staff recheck K+ upon arrival to ensure correction; order placed. This is a Community education officer; no formal response is required.  Honor Loh, MSN, APRN, FNP-C, CEN [facility]  Peri-operative Services Nurse Practitioner Phone: [phone] 11/10/19 2:10 PM

## 2019-11-11 ENCOUNTER — Inpatient Hospital Stay: Payer: Medicare Other

## 2019-11-11 ENCOUNTER — Encounter
Admission: RE | Disposition: A | Discharge status: Home / Self Care | Payer: Self-pay | Source: Home / Self Care | Attending: Cardiothoracic Surgery

## 2019-11-11 ENCOUNTER — Ambulatory Visit
Admission: RE | Admit: 2019-11-11 | Discharge: 2019-11-11 | Disposition: A | Discharge status: Home / Self Care | Payer: Medicare Other | Attending: Cardiothoracic Surgery | Admitting: Cardiothoracic Surgery

## 2019-11-11 ENCOUNTER — Encounter: Payer: Self-pay | Admitting: Cardiothoracic Surgery

## 2019-11-11 ENCOUNTER — Ambulatory Visit: Payer: Medicare Other

## 2019-11-11 ENCOUNTER — Other Ambulatory Visit: Payer: Self-pay

## 2019-11-11 ENCOUNTER — Ambulatory Visit: Payer: Medicare Other | Admitting: Urgent Care

## 2019-11-11 DIAGNOSIS — Z886 Allergy status to analgesic agent status: Secondary | ICD-10-CM | POA: Insufficient documentation

## 2019-11-11 DIAGNOSIS — Z95828 Presence of other vascular implants and grafts: Secondary | ICD-10-CM

## 2019-11-11 DIAGNOSIS — F17210 Nicotine dependence, cigarettes, uncomplicated: Secondary | ICD-10-CM | POA: Diagnosis not present

## 2019-11-11 DIAGNOSIS — Z8249 Family history of ischemic heart disease and other diseases of the circulatory system: Secondary | ICD-10-CM | POA: Insufficient documentation

## 2019-11-11 DIAGNOSIS — K219 Gastro-esophageal reflux disease without esophagitis: Secondary | ICD-10-CM | POA: Insufficient documentation

## 2019-11-11 DIAGNOSIS — Z833 Family history of diabetes mellitus: Secondary | ICD-10-CM | POA: Insufficient documentation

## 2019-11-11 DIAGNOSIS — Z809 Family history of malignant neoplasm, unspecified: Secondary | ICD-10-CM | POA: Insufficient documentation

## 2019-11-11 DIAGNOSIS — Z01810 Encounter for preprocedural cardiovascular examination: Secondary | ICD-10-CM | POA: Diagnosis not present

## 2019-11-11 DIAGNOSIS — Z880 Allergy status to penicillin: Secondary | ICD-10-CM | POA: Insufficient documentation

## 2019-11-11 DIAGNOSIS — C771 Secondary and unspecified malignant neoplasm of intrathoracic lymph nodes: Secondary | ICD-10-CM | POA: Insufficient documentation

## 2019-11-11 DIAGNOSIS — J449 Chronic obstructive pulmonary disease, unspecified: Secondary | ICD-10-CM | POA: Diagnosis not present

## 2019-11-11 DIAGNOSIS — Z79899 Other long term (current) drug therapy: Secondary | ICD-10-CM | POA: Diagnosis not present

## 2019-11-11 DIAGNOSIS — I10 Essential (primary) hypertension: Secondary | ICD-10-CM | POA: Insufficient documentation

## 2019-11-11 DIAGNOSIS — I129 Hypertensive chronic kidney disease with stage 1 through stage 4 chronic kidney disease, or unspecified chronic kidney disease: Secondary | ICD-10-CM | POA: Diagnosis not present

## 2019-11-11 DIAGNOSIS — J439 Emphysema, unspecified: Secondary | ICD-10-CM | POA: Diagnosis not present

## 2019-11-11 DIAGNOSIS — Z96642 Presence of left artificial hip joint: Secondary | ICD-10-CM | POA: Diagnosis not present

## 2019-11-11 DIAGNOSIS — G4730 Sleep apnea, unspecified: Secondary | ICD-10-CM | POA: Insufficient documentation

## 2019-11-11 DIAGNOSIS — C3432 Malignant neoplasm of lower lobe, left bronchus or lung: Secondary | ICD-10-CM | POA: Insufficient documentation

## 2019-11-11 DIAGNOSIS — G43909 Migraine, unspecified, not intractable, without status migrainosus: Secondary | ICD-10-CM | POA: Diagnosis not present

## 2019-11-11 DIAGNOSIS — Z7984 Long term (current) use of oral hypoglycemic drugs: Secondary | ICD-10-CM | POA: Insufficient documentation

## 2019-11-11 DIAGNOSIS — Z86010 Personal history of colonic polyps: Secondary | ICD-10-CM | POA: Diagnosis not present

## 2019-11-11 DIAGNOSIS — N189 Chronic kidney disease, unspecified: Secondary | ICD-10-CM | POA: Diagnosis not present

## 2019-11-11 DIAGNOSIS — E1122 Type 2 diabetes mellitus with diabetic chronic kidney disease: Secondary | ICD-10-CM | POA: Insufficient documentation

## 2019-11-11 DIAGNOSIS — Z91018 Allergy to other foods: Secondary | ICD-10-CM | POA: Insufficient documentation

## 2019-11-11 DIAGNOSIS — C3492 Malignant neoplasm of unspecified part of left bronchus or lung: Secondary | ICD-10-CM | POA: Diagnosis not present

## 2019-11-11 DIAGNOSIS — Z91013 Allergy to seafood: Secondary | ICD-10-CM | POA: Diagnosis not present

## 2019-11-11 DIAGNOSIS — E119 Type 2 diabetes mellitus without complications: Secondary | ICD-10-CM | POA: Diagnosis not present

## 2019-11-11 DIAGNOSIS — Z884 Allergy status to anesthetic agent status: Secondary | ICD-10-CM | POA: Diagnosis not present

## 2019-11-11 DIAGNOSIS — Z452 Encounter for adjustment and management of vascular access device: Secondary | ICD-10-CM | POA: Diagnosis not present

## 2019-11-11 DIAGNOSIS — C3490 Malignant neoplasm of unspecified part of unspecified bronchus or lung: Secondary | ICD-10-CM | POA: Diagnosis not present

## 2019-11-11 DIAGNOSIS — N181 Chronic kidney disease, stage 1: Secondary | ICD-10-CM | POA: Diagnosis not present

## 2019-11-11 HISTORY — PX: PORTACATH PLACEMENT: SHX2246

## 2019-11-11 LAB — POCT I-STAT, CHEM 8
BUN: 7 mg/dL (ref 6–20)
Calcium, Ion: 1.12 mmol/L — ABNORMAL LOW (ref 1.15–1.40)
Chloride: 101 mmol/L (ref 98–111)
Creatinine, Ser: 0.7 mg/dL (ref 0.61–1.24)
Glucose, Bld: 202 mg/dL — ABNORMAL HIGH (ref 70–99)
HCT: 37 % — ABNORMAL LOW (ref 39.0–52.0)
Hemoglobin: 12.6 g/dL — ABNORMAL LOW (ref 13.0–17.0)
Potassium: 3.4 mmol/L — ABNORMAL LOW (ref 3.5–5.1)
Sodium: 140 mmol/L (ref 135–145)
TCO2: 28 mmol/L (ref 22–32)

## 2019-11-11 LAB — GLUCOSE, CAPILLARY: Glucose-Capillary: 160 mg/dL — ABNORMAL HIGH (ref 70–99)

## 2019-11-11 SURGERY — INSERTION, TUNNELED CENTRAL VENOUS DEVICE, WITH PORT
Anesthesia: General

## 2019-11-11 MED ORDER — SEVOFLURANE IN SOLN
RESPIRATORY_TRACT | Status: AC
  Filled 2019-11-11: qty 250

## 2019-11-11 MED ORDER — PROPOFOL 10 MG/ML IV BOLUS
INTRAVENOUS | Status: AC
  Filled 2019-11-11: qty 20

## 2019-11-11 MED ORDER — SODIUM CHLORIDE 0.9 % IV SOLN: INTRAVENOUS | Status: DC

## 2019-11-11 MED ORDER — OXYCODONE HCL 5 MG PO TABS
5.0000 mg | ORAL_TABLET | ORAL | Status: DC | PRN
  Administered 2019-11-11: 5 mg via ORAL

## 2019-11-11 MED ORDER — LIDOCAINE HCL (PF) 2 % IJ SOLN
INTRAMUSCULAR | Status: AC
  Filled 2019-11-11: qty 5

## 2019-11-11 MED ORDER — PHENYLEPHRINE HCL (PRESSORS) 10 MG/ML IV SOLN
INTRAVENOUS | Status: DC | PRN
  Administered 2019-11-11: 200 ug via INTRAVENOUS
  Administered 2019-11-11: 100 ug via INTRAVENOUS
  Administered 2019-11-11: 200 ug via INTRAVENOUS

## 2019-11-11 MED ORDER — PROPOFOL 10 MG/ML IV BOLUS
INTRAVENOUS | Status: DC | PRN
  Administered 2019-11-11: 160 mg via INTRAVENOUS

## 2019-11-11 MED ORDER — CHLORHEXIDINE GLUCONATE CLOTH 2 % EX PADS: 6.0000 | MEDICATED_PAD | Freq: Once | CUTANEOUS | Status: DC

## 2019-11-11 MED ORDER — LIDOCAINE HCL (PF) 1 % IJ SOLN
INTRAMUSCULAR | Status: AC
  Filled 2019-11-11: qty 30

## 2019-11-11 MED ORDER — FENTANYL CITRATE (PF) 100 MCG/2ML IJ SOLN
INTRAMUSCULAR | Status: AC
  Filled 2019-11-11: qty 2

## 2019-11-11 MED ORDER — FENTANYL CITRATE (PF) 100 MCG/2ML IJ SOLN
INTRAMUSCULAR | Status: DC | PRN
Start: 2019-11-11 — End: 2019-11-11
  Administered 2019-11-11 (×4): 25 ug via INTRAVENOUS

## 2019-11-11 MED ORDER — EPHEDRINE SULFATE 50 MG/ML IJ SOLN
INTRAMUSCULAR | Status: DC | PRN
  Administered 2019-11-11: 10 mg via INTRAVENOUS
  Administered 2019-11-11: 5 mg via INTRAVENOUS
  Administered 2019-11-11 (×3): 10 mg via INTRAVENOUS

## 2019-11-11 MED ORDER — LIDOCAINE HCL 1 % IJ SOLN
INTRAMUSCULAR | Status: DC | PRN
  Administered 2019-11-11: 10 mL

## 2019-11-11 MED ORDER — ORAL CARE MOUTH RINSE: 15.0000 mL | Freq: Once | OROMUCOSAL | Status: AC

## 2019-11-11 MED ORDER — PHENYLEPHRINE HCL (PRESSORS) 10 MG/ML IV SOLN
INTRAVENOUS | Status: AC
  Filled 2019-11-11: qty 1

## 2019-11-11 MED ORDER — OXYCODONE HCL 5 MG PO TABS
ORAL_TABLET | ORAL | Status: AC
  Filled 2019-11-11: qty 1

## 2019-11-11 MED ORDER — LIDOCAINE HCL (CARDIAC) PF 100 MG/5ML IV SOSY
PREFILLED_SYRINGE | INTRAVENOUS | Status: DC | PRN
  Administered 2019-11-11: 100 mg via INTRAVENOUS

## 2019-11-11 MED ORDER — OXYCODONE HCL 5 MG PO TABS
5.0000 mg | ORAL_TABLET | ORAL | 0 refills | Status: DC | PRN
Start: 2019-11-11 — End: 2020-02-28

## 2019-11-11 MED ORDER — CHLORHEXIDINE GLUCONATE 0.12 % MT SOLN: 15.0000 mL | Freq: Once | OROMUCOSAL | Status: AC

## 2019-11-11 MED ORDER — CEFAZOLIN SODIUM-DEXTROSE 2-4 GM/100ML-% IV SOLN
INTRAVENOUS | Status: AC
  Filled 2019-11-11: qty 100

## 2019-11-11 MED ORDER — HEPARIN SODIUM (PORCINE) 5000 UNIT/ML IJ SOLN
INTRAMUSCULAR | Status: AC
  Filled 2019-11-11: qty 1

## 2019-11-11 MED ORDER — CHLORHEXIDINE GLUCONATE 0.12 % MT SOLN
OROMUCOSAL | Status: AC
  Filled 2019-11-11: qty 15

## 2019-11-11 MED ORDER — CHLORHEXIDINE GLUCONATE 0.12 % MT SOLN
OROMUCOSAL | Status: AC
  Administered 2019-11-11: 15 mL via OROMUCOSAL
  Filled 2019-11-11: qty 15

## 2019-11-11 MED ORDER — VASOPRESSIN 20 UNIT/ML IV SOLN
INTRAVENOUS | Status: DC | PRN
  Administered 2019-11-11: 1 [IU] via INTRAVENOUS
  Administered 2019-11-11: 2 [IU] via INTRAVENOUS
  Administered 2019-11-11: 1 [IU] via INTRAVENOUS
  Administered 2019-11-11 (×3): 2 [IU] via INTRAVENOUS
  Administered 2019-11-11: 1 [IU] via INTRAVENOUS

## 2019-11-11 MED ORDER — VASOPRESSIN 20 UNIT/ML IV SOLN
INTRAVENOUS | Status: AC
  Filled 2019-11-11: qty 1

## 2019-11-11 SURGICAL SUPPLY — 46 items
APL PRP STRL LF DISP 70% ISPRP (MISCELLANEOUS) ×1
BAG DECANTER FOR FLEXI CONT (MISCELLANEOUS) ×2 IMPLANT
BLADE SURG 15 STRL LF DISP TIS (BLADE) ×1 IMPLANT
BLADE SURG 15 STRL SS (BLADE) ×2
BLADE SURG SZ11 CARB STEEL (BLADE) ×2 IMPLANT
CANISTER SUCT 1200ML W/VALVE (MISCELLANEOUS) ×2 IMPLANT
CHLORAPREP W/TINT 26 (MISCELLANEOUS) ×2 IMPLANT
COVER LIGHT HANDLE STERIS (MISCELLANEOUS) ×4 IMPLANT
DRAPE C-ARM XRAY 36X54 (DRAPES) ×2 IMPLANT
DRAPE INCISE IOBAN 66X45 STRL (DRAPES) ×2 IMPLANT
DRAPE LAPAROTOMY TRNSV 106X77 (MISCELLANEOUS) ×2 IMPLANT
DRSG TEGADERM 2-3/8X2-3/4 SM (GAUZE/BANDAGES/DRESSINGS) ×2 IMPLANT
DRSG TEGADERM 4X4.75 (GAUZE/BANDAGES/DRESSINGS) ×2 IMPLANT
DRSG TELFA 4X3 1S NADH ST (GAUZE/BANDAGES/DRESSINGS) ×2 IMPLANT
ELECT CAUTERY BLADE TIP 2.5 (TIP) ×2
ELECT REM PT RETURN 9FT ADLT (ELECTROSURGICAL) ×2
ELECTRODE CAUTERY BLDE TIP 2.5 (TIP) ×1 IMPLANT
ELECTRODE REM PT RTRN 9FT ADLT (ELECTROSURGICAL) ×1 IMPLANT
GLOVE SURG SYN 7.5  E (GLOVE) ×1
GLOVE SURG SYN 7.5 E (GLOVE) ×1 IMPLANT
GOWN STRL REUS W/ TWL LRG LVL3 (GOWN DISPOSABLE) ×2 IMPLANT
GOWN STRL REUS W/TWL LRG LVL3 (GOWN DISPOSABLE) ×4
IV NS 500ML (IV SOLUTION) ×2
IV NS 500ML BAXH (IV SOLUTION) ×1 IMPLANT
KIT PORT POWER 8FR ISP CVUE (Port) ×2 IMPLANT
KIT TURNOVER KIT A (KITS) ×2 IMPLANT
LABEL OR SOLS (LABEL) ×2 IMPLANT
MARKER SKIN DUAL TIP RULER LAB (MISCELLANEOUS) ×2 IMPLANT
NEEDLE FILTER BLUNT 18X 1/2SAF (NEEDLE) ×1
NEEDLE FILTER BLUNT 18X1 1/2 (NEEDLE) ×1 IMPLANT
NEEDLE HYPO 22GX1.5 SAFETY (NEEDLE) ×4 IMPLANT
NS IRRIG 500ML POUR BTL (IV SOLUTION) ×2 IMPLANT
PACK PORT-A-CATH (MISCELLANEOUS) ×2 IMPLANT
STRIP CLOSURE SKIN 1/4X4 (GAUZE/BANDAGES/DRESSINGS) IMPLANT
SUT ETHILON 4-0 (SUTURE) ×4
SUT ETHILON 4-0 FS2 18XMFL BLK (SUTURE) ×2
SUT PROLENE 2 0 SH DA (SUTURE) ×4 IMPLANT
SUT VIC AB 2-0 SH 27 (SUTURE) ×2
SUT VIC AB 2-0 SH 27XBRD (SUTURE) ×1 IMPLANT
SUT VIC AB 3-0 SH 27 (SUTURE) ×2
SUT VIC AB 3-0 SH 27X BRD (SUTURE) ×1 IMPLANT
SUTURE ETHLN 4-0 FS2 18XMF BLK (SUTURE) ×2 IMPLANT
SYR 10ML SLIP (SYRINGE) ×2 IMPLANT
SYR 3ML LL SCALE MARK (SYRINGE) ×2 IMPLANT
TAPE CLOTH 3X10 WHT NS LF (GAUZE/BANDAGES/DRESSINGS) IMPLANT
TAPE TRANSPORE STRL 2 31045 (GAUZE/BANDAGES/DRESSINGS) ×2 IMPLANT

## 2019-11-11 NOTE — Progress Notes (Signed)
Pharmacist Chemotherapy Monitoring - Initial Assessment    Anticipated start date: 11/18/19  Regimen:  . Are orders appropriate based on the patient's diagnosis, regimen, and cycle? Yes . Does the plan date match the patient's scheduled date? Yes . Is the sequencing of drugs appropriate? Yes . Are the premedications appropriate for the patient's regimen? Yes . Prior Authorization for treatment is: Approved o If applicable, is the correct biosimilar selected based on the patient's insurance? not applicable  Organ Function and Labs: [name] Are dose adjustments needed based on the patient's renal function, hepatic function, or hematologic function? No . Are appropriate labs ordered prior to the start of patient's treatment? Yes . Other organ system assessment, if indicated: N/A . The following baseline labs, if indicated, have been ordered: N/A  Dose Assessment: . Are the drug doses appropriate? Yes . Are the following correct: o Drug concentrations Yes o IV fluid compatible with drug Yes o Administration routes Yes o Timing of therapy Yes . If applicable, does the patient have documented access for treatment and/or plans for port-a-cath placement? yes . If applicable, have lifetime cumulative doses been properly documented and assessed? yes Lifetime Dose Tracking  No doses have been documented on this patient for the following tracked chemicals: Doxorubicin, Epirubicin, Idarubicin, Daunorubicin, Mitoxantrone, Bleomycin, Oxaliplatin, Carboplatin, Liposomal Doxorubicin  o   Toxicity Monitoring/Prevention: . The patient has the following take home antiemetics prescribed: Ondansetron and Prochlorperazine . The patient has the following take home medications prescribed: N/A . Medication allergies and previous infusion related reactions, if applicable, have been reviewed and addressed. Yes . The patient's current medication list has been assessed for drug-drug interactions with their  chemotherapy regimen. no significant drug-drug interactions were identified on review.  Order Review: . Are the treatment plan orders signed? Yes . Is the patient scheduled to see a provider prior to their treatment? Yes  I verify that I have reviewed each item in the above checklist and answered each question accordingly.  Adelina Mings 11/11/2019 3:32 PM

## 2019-11-11 NOTE — Transfer of Care (Signed)
Immediate Anesthesia Transfer of Care Note  Patient: Kristopher [name]  Procedure(s) Performed: INSERTION PORT-A-CATH (N/A )  Patient Location: PACU  Anesthesia Type:General  Level of Consciousness: awake, alert  and oriented  Airway & Oxygen Therapy: Patient Spontanous Breathing and Patient connected to face mask oxygen  Post-op Assessment: Post -op Vital signs reviewed and stable  Post vital signs: stable  Last Vitals:  Vitals Value Taken Time  BP    Temp    Pulse    Resp    SpO2      Last Pain:  Vitals:   11/11/19 0624  TempSrc: Tympanic  PainSc: 0-No pain         Complications: No complications documented.

## 2019-11-11 NOTE — Op Note (Signed)
11/11/2019  11:18 AM  PATIENT:  Kristopher [name]  59 y.o. male  PRE-OPERATIVE DIAGNOSIS:  Lung cancer  POST-OPERATIVE DIAGNOSIS:  Lung cancer  PROCEDURE:  Procedure(s): INSERTION PORT-A-CATH (N/A)  SURGEON:  Surgeon(s) and Role:    * Nestor Lewandowsky, MD - Primary    * Olean Ree, MD - Assisting  ASSISTANTS:    ANESTHESIA:     DICTATION:   The patient was brought to the operating suite and placed in the supine position. The patient was then prepped and draped in usual sterile fashion.  Multiple attempts were made to cannulate the left subclavian vein.  This was unsuccessful.  Ultimately I asked Dr. Hampton Abbot to assist because of his skill with the use of the ultrasound.  He was able to use the ultrasound to percutaneously catheterized the left subclavian vein.  A guidewire was placed onto the right side of the heart.  This was performed with fluoroscopic guidance.  Once the guidewire was in the appropriate spot without evidence of pneumothorax we then proceeded to make our chest wall pocket.  This was made large enough to accommodate the port.  The catheter was then passed from the port pocket up to the entrance into the subclavian vein.  A peel-away sheath was then placed over the wire and the catheter was inserted and positioned at the superior vena caval right atrial junction.  The catheter was assembled and it irrigated and flushed nicely.  The catheter was flushed again with heparinized saline and it was then secured into the pocket with 3 interrupted Prolene sutures at each of the 3 corners.  The catheter was once again flushed and it irrigated and flushed nicely.  The wounds were then closed after one last fluoroscopy was performed.  The wounds were closed with running absorbable sutures.  A single stitch of nylon was placed at the insertion site underneath the clavicle.  Sterile dressings were applied.  The patient was then awakened and taken to the recovery room in stable condition.  All  sponge needle and instrument counts were correct as reported to me at the end of the case.  Nestor Lewandowsky, MD

## 2019-11-11 NOTE — Anesthesia Preprocedure Evaluation (Signed)
Anesthesia Evaluation  Patient identified by MRN, date of birth, ID band Patient awake    Reviewed: Allergy & Precautions, NPO status , Patient's Chart, lab work & pertinent test results  History of Anesthesia Complications Negative for: history of anesthetic complications  Airway Mallampati: II       Dental  (+) Edentulous Upper, Edentulous Lower   Pulmonary sleep apnea (not using CPAP) , COPD,  COPD inhaler, Not current smoker, former smoker,           Cardiovascular hypertension, Pt. on medications (-) Past MI and (-) CHF (-) dysrhythmias (-) Valvular Problems/Murmurs     Neuro/Psych neg Seizures    GI/Hepatic Neg liver ROS, GERD  Medicated and Controlled,  Endo/Other  diabetes, Type 2, Oral Hypoglycemic Agents  Renal/GU Renal InsufficiencyRenal disease     Musculoskeletal   Abdominal   Peds  Hematology   Anesthesia Other Findings   Reproductive/Obstetrics                             Anesthesia Physical Anesthesia Plan  ASA: III  Anesthesia Plan: General   Post-op Pain Management:    Induction: Intravenous  PONV Risk Score and Plan: 2 and Ondansetron and Dexamethasone  Airway Management Planned: LMA  Additional Equipment:   Intra-op Plan:   Post-operative Plan:   Informed Consent: I have reviewed the patients History and Physical, chart, labs and discussed the procedure including the risks, benefits and alternatives for the proposed anesthesia with the patient or authorized representative who has indicated his/her understanding and acceptance.       Plan Discussed with:   Anesthesia Plan Comments:         Anesthesia Quick Evaluation

## 2019-11-11 NOTE — Anesthesia Postprocedure Evaluation (Signed)
Anesthesia Post Note  Patient: Kristopher [name]  Procedure(s) Performed: INSERTION PORT-A-CATH (N/A )  Patient location during evaluation: PACU Anesthesia Type: General Level of consciousness: awake and alert Pain management: pain level controlled Vital Signs Assessment: post-procedure vital signs reviewed and stable Respiratory status: spontaneous breathing and respiratory function stable Cardiovascular status: stable Anesthetic complications: no   No complications documented.   Last Vitals:  Vitals:   11/11/19 1100 11/11/19 1131  BP: 114/84 117/76  Pulse: 63 64  Resp: 13 16  Temp: (!) 35.7 C   SpO2: 95% 100%    Last Pain:  Vitals:   11/11/19 1131  TempSrc: Temporal  PainSc: 5                  Lee Kalt K

## 2019-11-11 NOTE — Discharge Instructions (Signed)

## 2019-11-12 NOTE — Patient Instructions (Signed)
Paclitaxel injection What is this medicine? PACLITAXEL (PAK li TAX el) is a chemotherapy drug. It targets fast dividing cells, like cancer cells, and causes these cells to die. This medicine is used to treat ovarian cancer, breast cancer, lung cancer, Kaposi's sarcoma, and other cancers. This medicine may be used for other purposes; ask your health care provider or pharmacist if you have questions. COMMON BRAND NAME(S): Onxol, Taxol What should I tell my health care provider before I take this medicine? They need to know if you have any of these conditions:  history of irregular heartbeat  liver disease  low blood counts, like low white cell, platelet, or red cell counts  lung or breathing disease, like asthma  tingling of the fingers or toes, or other nerve disorder  an unusual or allergic reaction to paclitaxel, alcohol, polyoxyethylated castor oil, other chemotherapy, other medicines, foods, dyes, or preservatives  pregnant or trying to get pregnant  breast-feeding How should I use this medicine? This drug is given as an infusion into a vein. It is administered in a hospital or clinic by a specially trained health care professional. Talk to your pediatrician regarding the use of this medicine in children. Special care may be needed. Overdosage: If you think you have taken too much of this medicine contact a poison control center or emergency room at once. NOTE: This medicine is only for you. Do not share this medicine with others. What if I miss a dose? It is important not to miss your dose. Call your doctor or health care professional if you are unable to keep an appointment. What may interact with this medicine? Do not take this medicine with any of the following medications:  disulfiram  metronidazole This medicine may also interact with the following medications:  antiviral medicines for hepatitis, HIV or AIDS  certain antibiotics like erythromycin and  clarithromycin  certain medicines for fungal infections like ketoconazole and itraconazole  certain medicines for seizures like carbamazepine, phenobarbital, phenytoin  gemfibrozil  nefazodone  rifampin  St. John's wort This list may not describe all possible interactions. Give your health care provider a list of all the medicines, herbs, non-prescription drugs, or dietary supplements you use. Also tell them if you smoke, drink alcohol, or use illegal drugs. Some items may interact with your medicine. What should I watch for while using this medicine? Your condition will be monitored carefully while you are receiving this medicine. You will need important blood work done while you are taking this medicine. This medicine can cause serious allergic reactions. To reduce your risk you will need to take other medicine(s) before treatment with this medicine. If you experience allergic reactions like skin rash, itching or hives, swelling of the face, lips, or tongue, tell your doctor or health care professional right away. In some cases, you may be given additional medicines to help with side effects. Follow all directions for their use. This drug may make you feel generally unwell. This is not uncommon, as chemotherapy can affect healthy cells as well as cancer cells. Report any side effects. Continue your course of treatment even though you feel ill unless your doctor tells you to stop. Call your doctor or health care professional for advice if you get a fever, chills or sore throat, or other symptoms of a cold or flu. Do not treat yourself. This drug decreases your body's ability to fight infections. Try to avoid being around people who are sick. This medicine may increase your risk to bruise  or bleed. Call your doctor or health care professional if you notice any unusual bleeding. Be careful brushing and flossing your teeth or using a toothpick because you may get an infection or bleed more easily.  If you have any dental work done, tell your dentist you are receiving this medicine. Avoid taking products that contain aspirin, acetaminophen, ibuprofen, naproxen, or ketoprofen unless instructed by your doctor. These medicines may hide a fever. Do not become pregnant while taking this medicine. Women should inform their doctor if they wish to become pregnant or think they might be pregnant. There is a potential for serious side effects to an unborn child. Talk to your health care professional or pharmacist for more information. Do not breast-feed an infant while taking this medicine. Men are advised not to father a child while receiving this medicine. This product may contain alcohol. Ask your pharmacist or healthcare provider if this medicine contains alcohol. Be sure to tell all healthcare providers you are taking this medicine. Certain medicines, like metronidazole and disulfiram, can cause an unpleasant reaction when taken with alcohol. The reaction includes flushing, headache, nausea, vomiting, sweating, and increased thirst. The reaction can last from 30 minutes to several hours. What side effects may I notice from receiving this medicine? Side effects that you should report to your doctor or health care professional as soon as possible:  allergic reactions like skin rash, itching or hives, swelling of the face, lips, or tongue  breathing problems  changes in vision  fast, irregular heartbeat  high or low blood pressure  mouth sores  pain, tingling, numbness in the hands or feet  signs of decreased platelets or bleeding - bruising, pinpoint red spots on the skin, black, tarry stools, blood in the urine  signs of decreased red blood cells - unusually weak or tired, feeling faint or lightheaded, falls  signs of infection - fever or chills, cough, sore throat, pain or difficulty passing urine  signs and symptoms of liver injury like dark yellow or brown urine; general ill feeling or  flu-like symptoms; light-colored stools; loss of appetite; nausea; right upper belly pain; unusually weak or tired; yellowing of the eyes or skin  swelling of the ankles, feet, hands  unusually slow heartbeat Side effects that usually do not require medical attention (report to your doctor or health care professional if they continue or are bothersome):  diarrhea  hair loss  loss of appetite  muscle or joint pain  nausea, vomiting  pain, redness, or irritation at site where injected  tiredness This list may not describe all possible side effects. Call your doctor for medical advice about side effects. You may report side effects to FDA at 1-800-FDA-1088. Where should I keep my medicine? This drug is given in a hospital or clinic and will not be stored at home. NOTE: This sheet is a summary. It may not cover all possible information. If you have questions about this medicine, talk to your doctor, pharmacist, or health care provider.  2020 [employer]/Gold Standard (2016-10-22 13:14:55) Carboplatin injection What is this medicine? CARBOPLATIN (KAR boe pla tin) is a chemotherapy drug. It targets fast dividing cells, like cancer cells, and causes these cells to die. This medicine is used to treat ovarian cancer and many other cancers. This medicine may be used for other purposes; ask your health care provider or pharmacist if you have questions. COMMON BRAND NAME(S): Paraplatin What should I tell my health care provider before I take this medicine? They need to  know if you have any of these conditions:  blood disorders  hearing problems  kidney disease  recent or ongoing radiation therapy  an unusual or allergic reaction to carboplatin, cisplatin, other chemotherapy, other medicines, foods, dyes, or preservatives  pregnant or trying to get pregnant  breast-feeding How should I use this medicine? This drug is usually given as an infusion into a vein. It is administered in a  hospital or clinic by a specially trained health care professional. Talk to your pediatrician regarding the use of this medicine in children. Special care may be needed. Overdosage: If you think you have taken too much of this medicine contact a poison control center or emergency room at once. NOTE: This medicine is only for you. Do not share this medicine with others. What if I miss a dose? It is important not to miss a dose. Call your doctor or health care professional if you are unable to keep an appointment. What may interact with this medicine?  medicines for seizures  medicines to increase blood counts like filgrastim, pegfilgrastim, sargramostim  some antibiotics like amikacin, gentamicin, neomycin, streptomycin, tobramycin  vaccines Talk to your doctor or health care professional before taking any of these medicines:  acetaminophen  aspirin  ibuprofen  ketoprofen  naproxen This list may not describe all possible interactions. Give your health care provider a list of all the medicines, herbs, non-prescription drugs, or dietary supplements you use. Also tell them if you smoke, drink alcohol, or use illegal drugs. Some items may interact with your medicine. What should I watch for while using this medicine? Your condition will be monitored carefully while you are receiving this medicine. You will need important blood work done while you are taking this medicine. This drug may make you feel generally unwell. This is not uncommon, as chemotherapy can affect healthy cells as well as cancer cells. Report any side effects. Continue your course of treatment even though you feel ill unless your doctor tells you to stop. In some cases, you may be given additional medicines to help with side effects. Follow all directions for their use. Call your doctor or health care professional for advice if you get a fever, chills or sore throat, or other symptoms of a cold or flu. Do not treat  yourself. This drug decreases your body's ability to fight infections. Try to avoid being around people who are sick. This medicine may increase your risk to bruise or bleed. Call your doctor or health care professional if you notice any unusual bleeding. Be careful brushing and flossing your teeth or using a toothpick because you may get an infection or bleed more easily. If you have any dental work done, tell your dentist you are receiving this medicine. Avoid taking products that contain aspirin, acetaminophen, ibuprofen, naproxen, or ketoprofen unless instructed by your doctor. These medicines may hide a fever. Do not become pregnant while taking this medicine. Women should inform their doctor if they wish to become pregnant or think they might be pregnant. There is a potential for serious side effects to an unborn child. Talk to your health care professional or pharmacist for more information. Do not breast-feed an infant while taking this medicine. What side effects may I notice from receiving this medicine? Side effects that you should report to your doctor or health care professional as soon as possible:  allergic reactions like skin rash, itching or hives, swelling of the face, lips, or tongue  signs of infection - fever or  chills, cough, sore throat, pain or difficulty passing urine  signs of decreased platelets or bleeding - bruising, pinpoint red spots on the skin, black, tarry stools, nosebleeds  signs of decreased red blood cells - unusually weak or tired, fainting spells, lightheadedness  breathing problems  changes in hearing  changes in vision  chest pain  high blood pressure  low blood counts - This drug may decrease the number of white blood cells, red blood cells and platelets. You may be at increased risk for infections and bleeding.  nausea and vomiting  pain, swelling, redness or irritation at the injection site  pain, tingling, numbness in the hands or  feet  problems with balance, talking, walking  trouble passing urine or change in the amount of urine Side effects that usually do not require medical attention (report to your doctor or health care professional if they continue or are bothersome):  hair loss  loss of appetite  metallic taste in the mouth or changes in taste This list may not describe all possible side effects. Call your doctor for medical advice about side effects. You may report side effects to FDA at 1-800-FDA-1088. Where should I keep my medicine? This drug is given in a hospital or clinic and will not be stored at home. NOTE: This sheet is a summary. It may not cover all possible information. If you have questions about this medicine, talk to your doctor, pharmacist, or health care provider.  2020 Elsevier/Gold Standard (2007-05-26 14:38:05)

## 2019-11-13 DIAGNOSIS — Z510 Encounter for antineoplastic radiation therapy: Secondary | ICD-10-CM | POA: Diagnosis not present

## 2019-11-13 DIAGNOSIS — C771 Secondary and unspecified malignant neoplasm of intrathoracic lymph nodes: Secondary | ICD-10-CM | POA: Diagnosis not present

## 2019-11-13 DIAGNOSIS — C3432 Malignant neoplasm of lower lobe, left bronchus or lung: Secondary | ICD-10-CM | POA: Diagnosis not present

## 2019-11-14 NOTE — Progress Notes (Signed)
  [facility]  [phone]) [phone] [phone]) [phone]  ID: Kristopher [name] OB: May 09, 1960  MR#: [id number]  OHY#:[id number]  Patient Care Team: [name], DO as PCP - General (Family Medicine) Gavin Pound, CMA (Inactive) as Certified Medical Assistant Telford Nab, RN as Registered Nurse Vanita Ingles, RN as Case Manager (General Practice) [name], MD as Referring Physician (Pain Medicine) [name], MD as Consulting Physician (Oncology)     [name], MD   11/19/2019 6:45 AM     This encounter was created in error - please disregard.

## 2019-11-15 ENCOUNTER — Other Ambulatory Visit: Payer: Self-pay | Admitting: Pulmonary Disease

## 2019-11-15 ENCOUNTER — Inpatient Hospital Stay (HOSPITAL_BASED_OUTPATIENT_CLINIC_OR_DEPARTMENT_OTHER): Payer: Medicare Other | Admitting: Oncology

## 2019-11-15 ENCOUNTER — Inpatient Hospital Stay: Payer: Medicare Other | Attending: Oncology

## 2019-11-15 ENCOUNTER — Other Ambulatory Visit: Payer: Self-pay

## 2019-11-15 DIAGNOSIS — G4733 Obstructive sleep apnea (adult) (pediatric): Secondary | ICD-10-CM | POA: Diagnosis not present

## 2019-11-15 DIAGNOSIS — M549 Dorsalgia, unspecified: Secondary | ICD-10-CM | POA: Diagnosis not present

## 2019-11-15 DIAGNOSIS — Z79899 Other long term (current) drug therapy: Secondary | ICD-10-CM | POA: Insufficient documentation

## 2019-11-15 DIAGNOSIS — E785 Hyperlipidemia, unspecified: Secondary | ICD-10-CM | POA: Insufficient documentation

## 2019-11-15 DIAGNOSIS — C3492 Malignant neoplasm of unspecified part of left bronchus or lung: Secondary | ICD-10-CM

## 2019-11-15 DIAGNOSIS — E1122 Type 2 diabetes mellitus with diabetic chronic kidney disease: Secondary | ICD-10-CM | POA: Insufficient documentation

## 2019-11-15 DIAGNOSIS — C3432 Malignant neoplasm of lower lobe, left bronchus or lung: Secondary | ICD-10-CM | POA: Insufficient documentation

## 2019-11-15 DIAGNOSIS — N181 Chronic kidney disease, stage 1: Secondary | ICD-10-CM | POA: Diagnosis not present

## 2019-11-15 DIAGNOSIS — I129 Hypertensive chronic kidney disease with stage 1 through stage 4 chronic kidney disease, or unspecified chronic kidney disease: Secondary | ICD-10-CM | POA: Diagnosis not present

## 2019-11-16 ENCOUNTER — Telehealth: Payer: Self-pay

## 2019-11-17 ENCOUNTER — Ambulatory Visit: Admission: RE | Admit: 2019-11-17 | Payer: Medicare Other | Source: Ambulatory Visit

## 2019-11-17 ENCOUNTER — Telehealth: Payer: Medicare Other | Admitting: General Practice

## 2019-11-17 ENCOUNTER — Ambulatory Visit (INDEPENDENT_AMBULATORY_CARE_PROVIDER_SITE_OTHER): Payer: Medicare Other | Admitting: General Practice

## 2019-11-17 ENCOUNTER — Telehealth: Payer: Self-pay | Admitting: *Deleted

## 2019-11-17 DIAGNOSIS — E1169 Type 2 diabetes mellitus with other specified complication: Secondary | ICD-10-CM | POA: Diagnosis not present

## 2019-11-17 DIAGNOSIS — I10 Essential (primary) hypertension: Secondary | ICD-10-CM

## 2019-11-17 DIAGNOSIS — Z720 Tobacco use: Secondary | ICD-10-CM

## 2019-11-17 DIAGNOSIS — E785 Hyperlipidemia, unspecified: Secondary | ICD-10-CM | POA: Diagnosis not present

## 2019-11-17 DIAGNOSIS — C3492 Malignant neoplasm of unspecified part of left bronchus or lung: Secondary | ICD-10-CM

## 2019-11-17 DIAGNOSIS — E1122 Type 2 diabetes mellitus with diabetic chronic kidney disease: Secondary | ICD-10-CM

## 2019-11-17 DIAGNOSIS — N181 Chronic kidney disease, stage 1: Secondary | ICD-10-CM

## 2019-11-17 DIAGNOSIS — I129 Hypertensive chronic kidney disease with stage 1 through stage 4 chronic kidney disease, or unspecified chronic kidney disease: Secondary | ICD-10-CM

## 2019-11-17 DIAGNOSIS — F17210 Nicotine dependence, cigarettes, uncomplicated: Secondary | ICD-10-CM

## 2019-11-17 NOTE — Telephone Encounter (Signed)
Message received from radiation oncology that radiation treatments are still pending insurance authorization. Per Dr. Grayland Ormond chemotherapy treatment to be moved to next week when radiation will start. I called patient to explain changes in the plan, told him we will f/u with new appointments for lab/Finn/Carbo/Taxol early next week. Patient verbalized understanding of plan, appointments cancelled for tomorrow and scheduling message sent.   Patient asked about stitches being removed from port as he had been told this could be done at his visit tomorrow. I let patient know that we can take care of removing stitches at his visit early next week.

## 2019-11-17 NOTE — Chronic Care Management (AMB) (Signed)
Chronic Care Management   Follow Up Note   11/17/2019 Name: Kristopher [name] MRN: [id number] DOB: 1960/09/19  Referred by: [name], DO Reason for referral : Chronic Care Management (RNCM Follow up Chronic Disease Management and Care Coordination Needs)   Kristopher [name] is a 59 y.o. year old male who is a primary care patient of [name], DO. The CCM team was consulted for assistance with chronic disease management and care coordination needs.    Review of patient status, including review of consultants reports, relevant laboratory and other test results, and collaboration with appropriate care team members and the patient's provider was performed as part of comprehensive patient evaluation and provision of chronic care management services.    SDOH (Social Determinants of Health) assessments performed: Yes See Care Plan activities for detailed interventions related to [facility])     Outpatient Encounter Medications as of 11/17/2019  Medication Sig Note  . Accu-Chek FastClix Lancets MISC USE TO TEST BLOOD SUGAR 2X A DAY   . ACCU-CHEK GUIDE test strip USE TO TEST BLOOD SUGAR 2X A DAY   . amLODipine (NORVASC) 5 MG tablet Take 1 tablet (5 mg total) by mouth 2 (two) times daily.   . ARNUITY ELLIPTA 100 MCG/ACT AEPB Inhale 1 puff into the lungs daily.   [name] atorvastatin (LIPITOR) 10 MG tablet Take 1 tablet (10 mg total) by mouth daily at 6 PM.   . BREZTRI AEROSPHERE 160-9-4.8 MCG/ACT AERO Inhale 2 puffs into the lungs 2 (two) times daily.    . diphenhydrAMINE (BENADRYL) 25 MG tablet Take 50 mg by mouth daily.   . Dulaglutide (TRULICITY) 1.5 AY/3.0ZS SOPN Inject 0.5 mLs (1.5 mg total) into the skin once a week. (Patient taking differently: Inject 1.5 mg into the skin every Wednesday. )   . esomeprazole (NEXIUM) 40 MG capsule Take 1 capsule (40 mg total) by mouth daily.   . hydrALAZINE (APRESOLINE) 100 MG tablet Take 1 tablet (100 mg total) by mouth 2 (two) times daily.   [name]  lidocaine (LIDODERM) 5 % Place 1 patch onto the skin daily. Remove & Discard patch within 12 hours or as directed by MD (Patient taking differently: Place 1 patch onto the skin daily as needed (pain). Remove & Discard patch within 12 hours or as directed by MD)   . lidocaine-prilocaine (EMLA) cream Apply to affected area once (Patient taking differently: Apply 1 application topically daily as needed (port access). ) 11/09/2019: Hasnt started  . metFORMIN (GLUCOPHAGE) 500 MG tablet Take 2 tablets (1,000 mg total) by mouth 2 (two) times daily with a meal.   . metoprolol tartrate (LOPRESSOR) 50 MG tablet Take 50 mg by mouth 2 (two) times daily.   . montelukast (SINGULAIR) 10 MG tablet Take 10 mg by mouth daily.   [name] morphine (MS CONTIN) 30 MG 12 hr tablet Take 1 tablet (30 mg total) by mouth every 12 (twelve) hours. Must last 30 days. Do not break tablet   . Multiple Vitamin (MULTIVITAMIN WITH MINERALS) TABS tablet Take 1 tablet by mouth daily.   . NONFORMULARY OR COMPOUNDED ITEM Apply 1-2 mLs topically 4 (four) times daily as needed. 10% Ketamine/2% Cyclobenzaprine/6% Gabapentin Cream (Patient taking differently: Apply 1-2 mLs topically 4 (four) times daily as needed (pain). 10% Ketamine/2% Cyclobenzaprine/6% Gabapentin Cream)   . nortriptyline (PAMELOR) 25 MG capsule TAKE 2 CAPSULES (50 MG TOTAL) BY MOUTH AT BEDTIME.   [name] ondansetron (ZOFRAN) 8 MG tablet Take 1 tablet (8 mg total) by  mouth 2 (two) times daily as needed for refractory nausea / vomiting.   [name] oxyCODONE (OXY IR/ROXICODONE) 5 MG immediate release tablet Take 1 tablet (5 mg total) by mouth every 4 (four) hours as needed for moderate pain or severe pain.   . pantoprazole (PROTONIX) 40 MG tablet Take 1 tablet (40 mg total) by mouth daily. (Patient not taking: Reported on 11/09/2019)   . Potassium Chloride ER 20 MEQ TBCR TAKE 1 TABLET BY MOUTH EVERY DAY (Patient taking differently: Take 20 mEq by mouth daily. )   . prochlorperazine (COMPAZINE) 10 MG  tablet Take 1 tablet (10 mg total) by mouth every 6 (six) hours as needed (Nausea or vomiting).   . STIOLTO RESPIMAT 2.5-2.5 MCG/ACT AERS Inhale 2 puffs into the lungs daily.    . SUMAtriptan (IMITREX) 50 MG tablet TAKE 1 TAB AT ONSET OF MIGRAINE. MAY REPEAT IN 2 HOURS IF HEADACHE PERSISTS OR RECURS. (Patient taking differently: Take 50 mg by mouth every 2 (two) hours as needed for migraine. Take 1 tab at onset of migraine. May repeat in 2 hours if headache persists or recurs.)    Facility-Administered Encounter Medications as of 11/17/2019  Medication  . 0.9 %  sodium chloride infusion     Objective:  BP Readings from Last 3 Encounters:  11/11/19 102/76  11/05/19 (!) 152/103  11/03/19 132/84    Goals Addressed              This Visit's Progress   .  RNCM- I need to stay healthy for my granddaughter (pt-stated)        Current Barriers:  [name] Knowledge Deficits related to basic understanding of hypertension, diabetes,  and hyperlipidemia  pathophysiology and self care management . Knowledge Deficits related to understanding of medications prescribed for management of hypertension and hyperlipidemia  . Patient continues to smoke 0.5 packs a day- patient has started back since his surgery and has patches. Knows he needs to quit completely.  Is down to about 3 cigarettes a day . Exercise program limited by need of a new knee brace- the patient is more active- completed . New lung cancer diagnosis- surgery with removal of lung cancer- doing well post surgery. Does not need chemo or radiation.  09-15-2019: The patient has a new area detected. Is waiting for follow up for radiation treatment.  11-17-2019: Starting radiation and chemotherapy on 11-18-2019  Case Manager Clinical Goal(s):  [name] Over the next 120 days, patient will verbalize understanding of plan for diabetes management, hypertension management, HLD management and management of chronic disease processes   Interventions:  . Discussed  plans with patient for ongoing care management follow up and provided patient with direct contact information for care management team   . Evaluation of Heart healthy/ADA diet. The patient is drinking sodas, does not like diet. Education on the effects of foods high in salt and sugar in a patient with HTN and DM.  The patient states he is doing better with his diet. He is eating a lot of oranges. Education on hidden sugars and sodium in foods. The patient is eating "a lot" of salads.  The patient wants to keep his hemoglobin A1C down. The reading in February was 7.2, ^ from 5.8 in August of 2020.  Education on healthy food choices. The patient knows that corn and carrots have negative impact on blood sugars. The last couple of days readings are 135 and 140. 11-17-2019: The patient states his blood sugars are in range and  denies any episodes of hypoglycemia.  . Education on the body taking longer to heal when having procedures and teeth pulled due to his DM, HTN and smoking does not help with quick healing processes and increases risk of infection.  . Review of blood pressure readings. The last reading was 120/94 Encouraged to monitor blood pressure and discuss treatment options with pcp.  The patient says he is having a hard time with a dry mouth especially at night. Education on medications for HTN may be the reason for dry mouth. Will discuss with the pharmacist for review. 11-17-2019: Blood pressures are stable at this time. Will continue to monitor.   . Education of the benefits of smoking cessation and working with the patient to reach a goal of quitting.  The patient is down to 3 cigarettes a day.  The patient is using patches but not smoking with the patches on, does not like the taste of the nicotine gum.  11-17-2019: The patient continues to smoke. The patient says he is trying to quit. Will continue to work with the patient on smoking cessation.  . Review of exercise goals. The patient is walking his  drive way daily and pushing himself to go further each day. The patient feels better but admits he knows when he has overdone it. He wants to continue to improve his health. His 38 year old granddaughter enjoys going to [facility] and he is learning how to effectively pace his activity.   . Review of upcoming appointments. Sees Dr. Wynetta Emery on 11-23-2019 at 0900 am.   The patient denies any needs from [facility] team pharmacist and LCSW at this time. Knows assistance is available.    Patient Self Care Activities:  . UNABLE to independently manage HTN as evidenced by reported headaches and high b/p numbers.  . Checks BP and records as discussed . Unable to follow a hearth healthy/ADA diet  . Unable to maintain stable blood sugar readings as evidence of hemoglobin A1C of 7.2  Please see past updates related to this goal by clicking on the "Past Updates" button in the selected goal       .  RNCM: pt-"the cancer is back" (pt-stated)        CARE PLAN ENTRY (see longitudinal plan of care for additional care plan information)  Current Barriers:  [name] Knowledge Deficits related to reoccurrence of squamous cell carcinoma of left lung . Care Coordination needs related to radiation treatments  in a patient with reoccurrence of squamous cell carcinoma of left lung (disease states) . Chronic Disease Management support and education needs related to squamous cell carcinoma of the lung.  Starts radiation and chemotherapy on 11-18-2019. Port a cath placed on 11-11-2019.  Nurse Case Manager Clinical Goal(s):  [name] Over the next 120 days, patient will verbalize understanding of plan for treatment of squamous cell carcinoma of left lung . Over the next 120 days, patient will work with RNCM, pcp and specialist  to address needs related to reoccurrence of squamous cell carcinoma  . Over the next 120 days, patient will demonstrate a decrease in reoccurrence of cancer cells in lung exacerbations as evidenced by completion of radiation  and resolution of squamous cell carcinoma . Over the next 120 days, patient will attend all scheduled medical appointments: appointment with pcp on 11-23-2019 and is waiting for specialist to call to start radiation therapy. Starts radiation and chemotherapy on 11-18-2019. [name] Over the next 120 days, patient will demonstrate improved adherence to prescribed treatment  plan for lung cancer as evidenced bycompletion of radiation and remission of lung cancer  Interventions:  . Inter-disciplinary care team collaboration (see longitudinal plan of care) . Evaluation of current treatment plan related to squamous cell carcinoma of left lung and patient's adherence to plan as established by provider. The patient received his port a cath on 11-11-2019. He has had educational class and will start radiation and chemotherapy on 11-18-2019.  The patient is optimistic and feels good about the prognosis of his lung cancer.  . Advised patient to contact the specialist for any acute changes in his condition and to follow the recommendations of the oncologist during radiation and chemotherapy.  . Provided education to patient re: reoccurrence of lung cancer and taking radiation/chemotherapy.  The patient is optimistic and says the doctors on top of things. The patient told them to cut it out. He did not have to take chemo or radiation last time. He says it is a small area and they are optimistic of success with the treatment regimen with radiation and chemotherapy.   . Reviewed medications with patient and discussed compliance . Discussed plans with patient for ongoing care management follow up and provided patient with direct contact information for care management team . Provided patient and/or caregiver with community resources and care guides for help and  information about resources available in the community if needed. No expressed needs at this time but the patient will call for changes.  Advice worker). . Reviewed  scheduled/upcoming provider appointments including: appointment with pcp on 11-23-2019 at 0900 am and has several upcoming appointments to see oncologist and have treatments.   Patient Self Care Activities:  . Patient verbalizes understanding of plan to treat new reoccurrence of squamous cell carcinoma in left lung . Attends all scheduled provider appointments . Calls provider office for new concerns or questions . Unable to independently manage squamous cell carcinoma of left lung. No longer in remission.   Please see past updates related to this goal by clicking on the "Past Updates" button in the selected goal          Plan:   Telephone follow up appointment with care management team member scheduled for: 01-05-2020 at 2:15 pm   [place], MSN, [facility] Mobile: [phone]

## 2019-11-17 NOTE — Progress Notes (Signed)
Patient would like to ask if they are any natural remedies he could use to help with appetite like cbg gummies.

## 2019-11-17 NOTE — Patient Instructions (Signed)
Visit Information  Goals Addressed              This Visit's Progress   .  RNCM- I need to stay healthy for my granddaughter (pt-stated)        Current Barriers:  [name] Knowledge Deficits related to basic understanding of hypertension, diabetes,  and hyperlipidemia  pathophysiology and self care management . Knowledge Deficits related to understanding of medications prescribed for management of hypertension and hyperlipidemia  . Patient continues to smoke 0.5 packs a day- patient has started back since his surgery and has patches. Knows he needs to quit completely.  Is down to about 3 cigarettes a day . Exercise program limited by need of a new knee brace- the patient is more active- completed . New lung cancer diagnosis- surgery with removal of lung cancer- doing well post surgery. Does not need chemo or radiation.  09-15-2019: The patient has a new area detected. Is waiting for follow up for radiation treatment.  11-17-2019: Starting radiation and chemotherapy on 11-18-2019  Case Manager Clinical Goal(s):  [name] Over the next 120 days, patient will verbalize understanding of plan for diabetes management, hypertension management, HLD management and management of chronic disease processes   Interventions:  . Discussed plans with patient for ongoing care management follow up and provided patient with direct contact information for care management team   . Evaluation of Heart healthy/ADA diet. The patient is drinking sodas, does not like diet. Education on the effects of foods high in salt and sugar in a patient with HTN and DM.  The patient states he is doing better with his diet. He is eating a lot of oranges. Education on hidden sugars and sodium in foods. The patient is eating "a lot" of salads.  The patient wants to keep his hemoglobin A1C down. The reading in February was 7.2, ^ from 5.8 in August of 2020.  Education on healthy food choices. The patient knows that corn and carrots have negative impact  on blood sugars. The last couple of days readings are 135 and 140. 11-17-2019: The patient states his blood sugars are in range and denies any episodes of hypoglycemia.  . Education on the body taking longer to heal when having procedures and teeth pulled due to his DM, HTN and smoking does not help with quick healing processes and increases risk of infection.  . Review of blood pressure readings. The last reading was 120/94 Encouraged to monitor blood pressure and discuss treatment options with pcp.  The patient says he is having a hard time with a dry mouth especially at night. Education on medications for HTN may be the reason for dry mouth. Will discuss with the pharmacist for review. 11-17-2019: Blood pressures are stable at this time. Will continue to monitor.   . Education of the benefits of smoking cessation and working with the patient to reach a goal of quitting.  The patient is down to 3 cigarettes a day.  The patient is using patches but not smoking with the patches on, does not like the taste of the nicotine gum.  11-17-2019: The patient continues to smoke. The patient says he is trying to quit. Will continue to work with the patient on smoking cessation.  . Review of exercise goals. The patient is walking his drive way daily and pushing himself to go further each day. The patient feels better but admits he knows when he has overdone it. He wants to continue to improve his health. His  30 year old granddaughter enjoys going to [facility] and he is learning how to effectively pace his activity.   . Review of upcoming appointments. Sees Dr. Wynetta Emery on 11-23-2019 at 0900 am.   The patient denies any needs from [facility] team pharmacist and LCSW at this time. Knows assistance is available.    Patient Self Care Activities:  . UNABLE to independently manage HTN as evidenced by reported headaches and high b/p numbers.  . Checks BP and records as discussed . Unable to follow a hearth healthy/ADA diet  . Unable to  maintain stable blood sugar readings as evidence of hemoglobin A1C of 7.2  Please see past updates related to this goal by clicking on the "Past Updates" button in the selected goal       .  RNCM: pt-"the cancer is back" (pt-stated)        CARE PLAN ENTRY (see longitudinal plan of care for additional care plan information)  Current Barriers:  [name] Knowledge Deficits related to reoccurrence of squamous cell carcinoma of left lung . Care Coordination needs related to radiation treatments  in a patient with reoccurrence of squamous cell carcinoma of left lung (disease states) . Chronic Disease Management support and education needs related to squamous cell carcinoma of the lung.  Starts radiation and chemotherapy on 11-18-2019. Port a cath placed on 11-11-2019.  Nurse Case Manager Clinical Goal(s):  [name] Over the next 120 days, patient will verbalize understanding of plan for treatment of squamous cell carcinoma of left lung . Over the next 120 days, patient will work with RNCM, pcp and specialist  to address needs related to reoccurrence of squamous cell carcinoma  . Over the next 120 days, patient will demonstrate a decrease in reoccurrence of cancer cells in lung exacerbations as evidenced by completion of radiation and resolution of squamous cell carcinoma . Over the next 120 days, patient will attend all scheduled medical appointments: appointment with pcp on 11-23-2019 and is waiting for specialist to call to start radiation therapy. Starts radiation and chemotherapy on 11-18-2019. [name] Over the next 120 days, patient will demonstrate improved adherence to prescribed treatment plan for lung cancer as evidenced bycompletion of radiation and remission of lung cancer  Interventions:  . Inter-disciplinary care team collaboration (see longitudinal plan of care) . Evaluation of current treatment plan related to squamous cell carcinoma of left lung and patient's adherence to plan as established by provider. The  patient received his port a cath on 11-11-2019. He has had educational class and will start radiation and chemotherapy on 11-18-2019.  The patient is optimistic and feels good about the prognosis of his lung cancer.  . Advised patient to contact the specialist for any acute changes in his condition and to follow the recommendations of the oncologist during radiation and chemotherapy.  . Provided education to patient re: reoccurrence of lung cancer and taking radiation/chemotherapy.  The patient is optimistic and says the doctors on top of things. The patient told them to cut it out. He did not have to take chemo or radiation last time. He says it is a small area and they are optimistic of success with the treatment regimen with radiation and chemotherapy.   . Reviewed medications with patient and discussed compliance . Discussed plans with patient for ongoing care management follow up and provided patient with direct contact information for care management team . Provided patient and/or caregiver with community resources and care guides for help and  information about resources available in  the community if needed. No expressed needs at this time but the patient will call for changes.  Advice worker). . Reviewed scheduled/upcoming provider appointments including: appointment with pcp on 11-23-2019 at 0900 am and has several upcoming appointments to see oncologist and have treatments.   Patient Self Care Activities:  . Patient verbalizes understanding of plan to treat new reoccurrence of squamous cell carcinoma in left lung . Attends all scheduled provider appointments . Calls provider office for new concerns or questions . Unable to independently manage squamous cell carcinoma of left lung. No longer in remission.   Please see past updates related to this goal by clicking on the "Past Updates" button in the selected goal         Patient verbalizes understanding of instructions provided today.    Telephone follow up appointment with care management team member scheduled for: 01-05-2020 at 2:15 pm  [place], MSN, [facility] Mobile: [phone]

## 2019-11-18 ENCOUNTER — Inpatient Hospital Stay: Payer: Medicare Other

## 2019-11-18 ENCOUNTER — Encounter: Payer: Self-pay | Admitting: Pain Medicine

## 2019-11-18 ENCOUNTER — Other Ambulatory Visit: Payer: Self-pay

## 2019-11-18 ENCOUNTER — Ambulatory Visit: Payer: Medicare Other | Attending: Pain Medicine | Admitting: Pain Medicine

## 2019-11-18 ENCOUNTER — Inpatient Hospital Stay: Payer: Medicare Other | Admitting: Oncology

## 2019-11-18 ENCOUNTER — Ambulatory Visit: Payer: Medicare Other

## 2019-11-18 DIAGNOSIS — M79605 Pain in left leg: Secondary | ICD-10-CM | POA: Diagnosis not present

## 2019-11-18 DIAGNOSIS — M5416 Radiculopathy, lumbar region: Secondary | ICD-10-CM | POA: Diagnosis not present

## 2019-11-18 DIAGNOSIS — M5442 Lumbago with sciatica, left side: Secondary | ICD-10-CM

## 2019-11-18 DIAGNOSIS — Z79899 Other long term (current) drug therapy: Secondary | ICD-10-CM

## 2019-11-18 DIAGNOSIS — G894 Chronic pain syndrome: Secondary | ICD-10-CM

## 2019-11-18 DIAGNOSIS — M5441 Lumbago with sciatica, right side: Secondary | ICD-10-CM

## 2019-11-18 DIAGNOSIS — G8929 Other chronic pain: Secondary | ICD-10-CM

## 2019-11-18 DIAGNOSIS — F1120 Opioid dependence, uncomplicated: Secondary | ICD-10-CM

## 2019-11-18 MED ORDER — MORPHINE SULFATE 15 MG PO TABS: 15.0000 mg | ORAL_TABLET | Freq: Four times a day (QID) | ORAL | 0 refills | Status: DC | PRN

## 2019-11-18 MED ORDER — NONFORMULARY OR COMPOUNDED ITEM: 1.0000 mL | Freq: Four times a day (QID) | 12 refills | Status: DC | PRN

## 2019-11-18 NOTE — Progress Notes (Signed)
Patient: Kristopher [name]  Service Category: E/M  Provider: Gaspar Cola, MD  DOB: 1960/09/22  DOS: 11/18/2019  Location: Office  MRN: [id number]  Setting: Ambulatory outpatient  Referring Provider: [name], DO  Type: Established Patient  Specialty: Interventional Pain Management  PCP: [name], DO  Location: Remote location  Delivery: TeleHealth     Virtual Encounter - Pain Management PROVIDER NOTE: Information contained herein reflects review and annotations entered in association with encounter. Interpretation of such information and data should be left to medically-trained personnel. Information provided to patient can be located elsewhere in the medical record under "Patient Instructions". Document created using STT-dictation technology, any transcriptional errors that may result from process are unintentional.    Contact & Pharmacy Preferred: [phone] Home: [phone] (home) Mobile: [phone] (mobile) E-mail: No e-mail address on record  [id number] [id number]: [phone]: [phone] [place] [address]: [phone]: [phone]  Pre-screening  [name] "in-person" vs "virtual" encounter. He indicated preferring virtual for this encounter.   Reason COVID-19*  Social distancing based on CDC and AMA recommendations.   I contacted Kristopher [name] 11/18/2019 via telephone.      I clearly identified myself as FGaspar Cola MD. I verified that I was speaking with the correct person using two identifiers (Name: Kristopher [name] and date of birth: 1Apr 16, 1962.  Consent I sought verbal advanced consent from Kristopher Coopfor virtual visit interactions. I informed Kristopher [name] possible security and privacy concerns, risks, and limitations associated with  providing "not-in-person" medical evaluation and management services. I also informed Kristopher [name] the availability of "in-person" appointments. Finally, I informed him that there would be a charge for the virtual visit and that he could be  personally, fully or partially, financially responsible for it. Kristopher [name] understanding and agreed to proceed.   Historic Elements   Mr. MDEONTREY MASSIis a 59y.o. year old, male patient evaluated today after our last contact on 10/13/2019. Kristopher [name] a past medical history of Allergy, Arthritis, Benign hypertensive kidney disease, Chronic back pain, Diabetes mellitus, type 2 ([place], Dyspnea, GERD (gastroesophageal reflux disease), Hypertension, Malignant neoplasm of lung ([place], and Migraines. He also  [name] a past surgical history that includes Back surgery; Appendectomy; Leg Surgery; Knee surgery (Left); Foot surgery (Left); Colonoscopy with propofol (N/A, 02/19/2016); polypectomy (N/A, 02/19/2016); Total hip arthroplasty (Left, 12/02/2017); Colonoscopy with propofol (N/A, 01/19/2018); polypectomy (01/19/2018); DG OPERATIVE LEFT HIP (ACrownHX); Joint replacement (Left, 12/2017); Video bronchoscopy (Left, 01/14/2019); Thoracotomy (Left, 01/14/2019); Flexible bronchoscopy (Bilateral, 01/20/2019); Flexible bronchoscopy (Bilateral, 01/22/2019); Electormagnetic navigation bronchoscopy (Left, 11/18/2018); Lung cancer surgery; Video bronchoscopy with endobronchial ultrasound (N/A, 10/15/2019); Video bronchoscopy with endobronchial navigation (N/A, 10/15/2019); and Portacath placement (N/A, 11/11/2019). [name] a current medication list which includes the following prescription(s): accu-chek fastclix lancets, accu-chek guide, amlodipine, arnuity ellipta, atorvastatin, breztri aerosphere, diphenhydramine, trulicity, esomeprazole, hydralazine, lidocaine, lidocaine-prilocaine, metformin, metoprolol tartrate, montelukast, multivitamin with minerals, [START ON 01/11/2020]  NONFORMULARY OR COMPOUNDED ITEM, nortriptyline, ondansetron, oxycodone, pantoprazole, potassium chloride er, prochlorperazine, stiolto respimat, sumatriptan, [START ON 11/26/2019] morphine, [START ON 12/26/2019] morphine, and [START ON 01/25/2020] morphine, and the following Facility-Administered Medications: sodium chloride. He  reports that he quit smoking about 10 months ago. His smoking use included cigarettes. He [name]  a 1.75 pack-year smoking history. He [name] never used smokeless tobacco. He reports current drug use. Drug: Oxycodone. He reports that he does not drink alcohol. Kristopher [name] is allergic to acetaminophen, aspirin, epinephrine, novocain [procaine], penicillins, strawberry extract, and shellfish allergy.   HPI  Today, he is being contacted for medication management. The patient indicates doing well with the current medication regimen. No adverse reactions or side effects reported to the medications.  The BMP was evaluated today again we noticed that he had some oxycodone prescribed to him by another physician.  According to the patient this is the oncologist.  Today we have reminded the patient that the prescription of these controlled substances will be either or, but not both physicians.  I asked him if we would be transferring his medications to his oncologist, but he indicated that they will not be taking over the medicine, that hopefully they will get rid of the cancer and he will continue to get the medicines from [place].  Today he indicated that there MS Contin 30 mg p.o. twice daily does seem to work, but it does not last the 12 hours.  Since were not going to be increasing the total MME/day, we will be switching him to the morphine IR 15 mg, 1 tablet p.o. every 6 hours (60 mg/day of morphine) (60 MME).  He understood the plan and agreed.  Pharmacotherapy Assessment  Analgesic: Morphine IR 15 mg, 1 tablet p.o. every 6 hours (60 mg/day of morphine) (60 MME) + Compounded Specialty Analgesic Cream  (w/ Ketamine) MME/day:60 mg/day.   Monitoring: [place] PMP: PDMP reviewed during this encounter.       Pharmacotherapy: No side-effects or adverse reactions reported. Compliance: No problems identified. Effectiveness: Clinically acceptable. Plan: Refer to "POC".  UDS:  Summary  Date Value Ref Range Status  10/13/2019 Note  Final    Comment:    ==================================================================== [employer] Select 13 (MW) ==================================================================== Test                             Result       Flag       Units  Drug Present and Declared for Prescription Verification   Oxycodone                      1276         EXPECTED   ng/mg creat   Oxymorphone                    1252         EXPECTED   ng/mg creat   Noroxycodone                   1238         EXPECTED   ng/mg creat   Noroxymorphone                 131          EXPECTED   ng/mg creat    Sources of oxycodone are scheduled prescription medications.    Oxymorphone, noroxycodone, and noroxymorphone are expected    metabolites of oxycodone. Oxymorphone is also available as a    scheduled prescription medication.  ==================================================================== Test                      Result    Flag   Units      Ref Range  Creatinine              121              mg/dL      >=20 ==================================================================== Declared Medications:  The flagging and interpretation on this report are based on the  following declared medications.  Unexpected results may arise from  inaccuracies in the declared medications.   **Note: The testing scope of this panel includes these medications:   Oxycodone   **Note: The testing scope of this panel does not include the  following reported medications:   Amlodipine (Norvasc)  Atorvastatin (Lipitor)  Dulaglutide (Trulicity)  Fluticasone  Hydralazine (Apresoline)  Metformin  (Glucophage)  Metoprolol (Toprol)  Montelukast (Singulair)  Nortriptyline (Pamelor)  Pantoprazole (Protonix)  Potassium (Klor-Con)  Sumatriptan (Imitrex)  Topical Lidocaine ==================================================================== For clinical consultation, please call [phone]. ====================================================================     Laboratory Chemistry Profile   Renal Lab Results  Component Value Date   BUN 7 11/11/2019   CREATININE 0.70 11/11/2019   BCR 11 08/25/2019   GFRAA >60 11/10/2019   GFRNONAA >60 11/10/2019     Hepatic Lab Results  Component Value Date   AST 67 (H) 11/10/2019   ALT 58 (H) 11/10/2019   ALBUMIN 4.5 11/10/2019   ALKPHOS 132 (H) 11/10/2019   LIPASE 50 02/17/2019     Electrolytes Lab Results  Component Value Date   NA 140 11/11/2019   K 3.4 (L) 11/11/2019   CL 101 11/11/2019   CALCIUM 9.3 11/10/2019   MG 2.4 01/25/2019   PHOS 3.5 01/23/2019     Bone No results found for: VD25OH, VD125OH2TOT, ER7408XK4, [id number], 25OHVITD1, 25OHVITD2, 25OHVITD3, TESTOFREE, TESTOSTERONE   Inflammation (CRP: Acute Phase) (ESR: Chronic Phase) Lab Results  Component Value Date   CRP 0.7 04/03/2015   ESRSEDRATE 11 10/21/2017   LATICACIDVEN 1.7 01/16/2019       Note: Above Lab results reviewed.  Imaging  X-ray chest PA or AP CLINICAL DATA:  Port-A-Cath placement  EXAM: CHEST  1 VIEW  COMPARISON:  Chest radiograph November 10, 2019 study obtained earlier in the day  FINDINGS: Port-A-Cath tip is in the superior vena cava. No pneumothorax. No edema or airspace opacity. Heart size within normal limits. Pulmonary vascularity normal. No adenopathy appreciable by radiography. No evident bone lesions.  IMPRESSION: Port-A-Cath tip in superior vena cava. No pneumothorax. Otherwise no change compared to study obtained earlier in the day.  Electronically Signed   By: Lowella Grip III M.D.   On: 11/11/2019  10:51 DG C-Arm 1-60 Min-No Report Fluoroscopy was utilized by the requesting physician.  No radiographic  interpretation.   Assessment  The primary encounter diagnosis was Chronic pain syndrome. Diagnoses of Chronic low back pain (Primary Area of Pain) (Bilateral) (L>R), Chronic lower extremity pain (Secondary area of Pain) (Left), Chronic lumbar radicular pain (Secondary area of Pain) (S1 dermatomal) (Left), Pharmacologic therapy, and Uncomplicated opioid dependence (HCC) were also pertinent to this visit.  Plan of Care  Problem-specific:  No problem-specific Assessment & Plan notes found for this encounter.  Mr. OWAIS PRUETT [name] a current medication list which includes the following long-term medication(s): amlodipine, atorvastatin, esomeprazole, hydralazine, metformin, nortriptyline, pantoprazole, potassium chloride er, prochlorperazine, and sumatriptan.  Pharmacotherapy (Medications Ordered): Meds ordered this encounter  Medications  . NONFORMULARY OR COMPOUNDED ITEM    Sig: Apply 1-2 mLs topically 4 (four) times daily as needed. 10% Ketamine/2% Cyclobenzaprine/6% Gabapentin Cream    Dispense:  240 each    Refill:  12    Please compound: Ketamine (10%) + Cyclobenzaprine (2%) + Gabapentin Cream (6%) into cream  . morphine (MSIR) 15 MG tablet    Sig: Take 1 tablet (15 mg total) by mouth every 6 (six) hours as needed for moderate pain or severe pain. Must last 30 days.    Dispense:  120 tablet    Refill:  0    Chronic Pain: STOP Act (Not applicable) Fill 1 day early if closed on refill date. Avoid benzodiazepines within 8 hours of opioids  . morphine (MSIR) 15 MG tablet    Sig: Take 1 tablet (15 mg total) by mouth every 6 (six) hours as needed for moderate pain or severe pain. Must last 30 days.    Dispense:  120 tablet    Refill:  0    Chronic Pain: STOP Act (Not applicable) Fill 1 day early if closed on refill date. Avoid benzodiazepines within 8 hours of opioids  . morphine  (MSIR) 15 MG tablet    Sig: Take 1 tablet (15 mg total) by mouth every 6 (six) hours as needed for moderate pain or severe pain. Must last 30 days.    Dispense:  120 tablet    Refill:  0    Chronic Pain: STOP Act (Not applicable) Fill 1 day early if closed on refill date. Avoid benzodiazepines within 8 hours of opioids   Orders:  No orders of the defined types were placed in this encounter.  Follow-up plan:   Return in about 14 weeks (around 02/24/2020) for (F2F), (Med Mgmt).      Interventional therapies: Planned, scheduled, and/or pending:   None at this time. Claims to have anaphylactic allergy to Local Anesthetics. He was sent for testing and shown/proven not to be allergic to Lidocaine.   Considering:   None at this time. Claims to have anaphylactic allergy to Local Anesthetics. He was sent for testing and shown/proven not to be allergic to Lidocaine.   Palliative PRN treatment(s):   None at this time. Claims to have anaphylactic allergy to Local Anesthetics. He was sent for testing and shown/proven not to be allergic to Lidocaine.       Recent Visits Date Type Provider Dept  10/13/19 Office Visit [name], MD [facility]  Showing recent visits within past 90 days and meeting all other requirements Today's Visits Date Type Provider Dept  11/18/19 Telemedicine [name], MD [facility]  Showing today's visits and meeting all other requirements Future Appointments No visits were found meeting these conditions. Showing future appointments within next 90 days and meeting all other requirements  I discussed the assessment and treatment plan with the patient. The patient was provided an opportunity to ask questions and all were answered. The patient agreed with the plan and demonstrated an understanding of the instructions.  Patient advised to call back or seek an in-person evaluation if the symptoms or condition worsens.  Duration of  encounter: 15 minutes.  Note by: Gaspar Cola, MD Date: 11/18/2019; Time: 11:04 AM

## 2019-11-19 ENCOUNTER — Ambulatory Visit: Payer: Medicare Other

## 2019-11-19 NOTE — Progress Notes (Signed)
[place]  Telephone:([phone] [phone]  Patient Care Team: [name], DO as PCP - General (Family Medicine) Gavin Pound, [place] (Inactive) as Certified Medical Assistant [place], Angie Fava, RN as Registered Nurse Vanita Ingles, RN as Case Manager ([place]) [name], MD as Referring Physician (Pain Medicine) [name], MD as Consulting Physician (Oncology)   Name of the patient: Kristopher [name]  [id number]  [birth year]   Date of visit: 11/19/19  Diagnosis-recurrent lung cancer  Chief complaint/Reason for visit- Initial Meeting for [facility], preparing for starting chemotherapy  Heme/Onc history:  Oncology History  Squamous cell carcinoma of left lung ([place])  01/16/2019 Initial Diagnosis   Squamous cell carcinoma of left lung ([place])   02/12/2019 Cancer Staging   Staging form: Lung, AJCC 8th Edition - Clinical stage from 02/12/2019: Stage IIIA (cT1b, cN2, cM0) - Signed by [name], MD on 11/01/2019   11/18/2019 -  Chemotherapy   The patient had palonosetron (ALOXI) injection 0.25 mg, 0.25 mg, Intravenous,  Once, 0 of 8 cycles CARBOplatin (PARAPLATIN) 300 mg in sodium chloride 0.9 % 100 mL chemo infusion, 300 mg (100 % of original dose 300 mg), Intravenous,  Once, 0 of 8 cycles Dose modification:   (original dose 300 mg, Cycle 1) PACLitaxel (TAXOL) 102 mg in sodium chloride 0.9 % 250 mL chemo infusion (</= 85m/m2), 45 mg/m2 = 102 mg, Intravenous,  Once, 0 of 8 cycles  for chemotherapy treatment.      Interval history-Mr. WGregsonis a 59year old male who presents to chemo care clinic today for initial meeting in preparation for starting chemotherapy. I introduced the chemo care clinic and we discussed that the role of the clinic is to assist those who are at an increased risk of emergency room visits and/or complications during the course of chemotherapy  treatment. We discussed that the increased risk takes into account factors such as age, performance status, and co-morbidities. We also discussed that for some, this might include barriers to care such as not having a primary care provider, lack of insurance/transportation, or not being able to afford medications. We discussed that the goal of the program is to help prevent unplanned ER visits and help reduce complications during chemotherapy. We do this by discussing specific risk factors to each individual and identifying ways that we can help improve these risk factors and reduce barriers to care.   ECOG FS:1 - Symptomatic but completely ambulatory  Review of systems- Review of Systems  Constitutional: Negative.  Negative for chills, fever, malaise/fatigue and weight loss.  HENT: Negative for congestion, ear pain and tinnitus.   Eyes: Negative.  Negative for blurred vision and double vision.  Respiratory: Negative.  Negative for cough, sputum production and shortness of breath.   Cardiovascular: Negative.  Negative for chest pain, palpitations and leg swelling.  Gastrointestinal: Negative.  Negative for abdominal pain, constipation, diarrhea, nausea and vomiting.  Genitourinary: Negative for dysuria, frequency and urgency.  Musculoskeletal: Negative for back pain and falls.  Skin: Negative.  Negative for rash.  Neurological: Negative.  Negative for weakness and headaches.  Endo/Heme/Allergies: Negative.  Does not bruise/bleed easily.  Psychiatric/Behavioral: Negative.  Negative for depression. The patient is not nervous/anxious and does not have insomnia.      Current treatment-beginning treatment with carbo/Taxol and concurrent radiation  Allergies  Allergen Reactions  . Acetaminophen Swelling  . Aspirin Anaphylaxis  . Epinephrine Anaphylaxis    Does not  include albuterol  . Novocain [Procaine] Anaphylaxis  . Penicillins Anaphylaxis    Has patient had a PCN reaction causing immediate  rash, facial/tongue/throat swelling, SOB or lightheadedness with hypotension: Yes Has patient had a PCN reaction causing severe rash involving mucus membranes or skin necrosis: No Has patient had a PCN reaction that required hospitalization: Yes Has patient had a PCN reaction occurring within the last 10 years: No If all of the above answers are "NO", then may proceed with Cephalosporin use.   . Strawberry Extract Anaphylaxis  . Shellfish Allergy Hives and Nausea And Vomiting    Past Medical History:  Diagnosis Date  . Allergy   . Arthritis    left foot  . Benign hypertensive kidney disease   . Chronic back pain    Four rods in back  . Diabetes mellitus, type 2 ([place])   . Dyspnea   . GERD (gastroesophageal reflux disease)   . Hypertension   . Malignant neoplasm of lung ([place])   . Migraines    daily    Past Surgical History:  Procedure Laterality Date  . APPENDECTOMY    . BACK SURGERY    . COLONOSCOPY WITH PROPOFOL N/A 02/19/2016   Procedure: COLONOSCOPY WITH PROPOFOL;  Surgeon: Lucilla Lame, MD;  Location: [place];  Service: Endoscopy;  Laterality: N/A;  . COLONOSCOPY WITH PROPOFOL N/A 01/19/2018   Procedure: COLONOSCOPY WITH PROPOFOL;  Surgeon: Lucilla Lame, MD;  Location: [place];  Service: Endoscopy;  Laterality: N/A;  Diabetic - oral meds  . DG OPERATIVE LEFT HIP ([place] HX)     10/19  . ELECTROMAGNETIC NAVIGATION BROCHOSCOPY Left 11/18/2018   Procedure: ELECTROMAGNETIC NAVIGATION BRONCHOSCOPY;  Surgeon: Tyler Pita, MD;  Location: [facility];  Service: Cardiopulmonary;  Laterality: Left;  . FLEXIBLE BRONCHOSCOPY Bilateral 01/20/2019   Procedure: FLEXIBLE BRONCHOSCOPY;  Surgeon: Ottie Glazier, MD;  Location: [facility];  Service: Thoracic;  Laterality: Bilateral;  . FLEXIBLE BRONCHOSCOPY Bilateral 01/22/2019   Procedure: FLEXIBLE BRONCHOSCOPY;  Surgeon: Ottie Glazier, MD;  Location: [facility];  Service: Thoracic;  Laterality: Bilateral;  . FOOT  SURGERY Left    Screws and plates  . JOINT REPLACEMENT Left 12/2017   DR Rudene Christians Hip  . KNEE SURGERY Left    X 2  . LEG SURGERY    . LUNG CANCER SURGERY    . POLYPECTOMY N/A 02/19/2016   Procedure: POLYPECTOMY;  Surgeon: Lucilla Lame, MD;  Location: [place];  Service: Endoscopy;  Laterality: N/A;  . POLYPECTOMY  01/19/2018   Procedure: POLYPECTOMY;  Surgeon: Lucilla Lame, MD;  Location: [place];  Service: Endoscopy;;  . PORTACATH PLACEMENT N/A 11/11/2019   Procedure: INSERTION PORT-A-CATH;  Surgeon: Nestor Lewandowsky, MD;  Location: [facility];  Service: General;  Laterality: N/A;  . THORACOTOMY Left 01/14/2019   Procedure: THORACOTOMY MAJOR, LEFT;  Surgeon: Nestor Lewandowsky, MD;  Location: [facility];  Service: General;  Laterality: Left;  . TOTAL HIP ARTHROPLASTY Left 12/02/2017   Procedure: TOTAL HIP ARTHROPLASTY ANTERIOR APPROACH;  Surgeon: Hessie Knows, MD;  Location: [facility];  Service: Orthopedics;  Laterality: Left;  [name] VIDEO BRONCHOSCOPY Left 01/14/2019   Procedure: VIDEO BRONCHOSCOPY WITH FLUORO, LEFT;  Surgeon: Nestor Lewandowsky, MD;  Location: [facility];  Service: General;  Laterality: Left;  [name] VIDEO BRONCHOSCOPY WITH ENDOBRONCHIAL NAVIGATION N/A 10/15/2019   Procedure: VIDEO BRONCHOSCOPY WITH ENDOBRONCHIAL NAVIGATION;  Surgeon: Ottie Glazier, MD;  Location: [facility];  Service: Thoracic;  Laterality: N/A;  . VIDEO BRONCHOSCOPY WITH ENDOBRONCHIAL ULTRASOUND N/A  10/15/2019   Procedure: VIDEO BRONCHOSCOPY WITH ENDOBRONCHIAL ULTRASOUND;  Surgeon: Ottie Glazier, MD;  Location: [facility];  Service: Thoracic;  Laterality: N/A;    Social History   Socioeconomic History  . Marital status: Significant Other    Spouse name: Not on file  . Number of children: Not on file  . Years of education: Not on file  . Highest education level: Not on file  Occupational History  . Occupation: disability  Tobacco Use  . Smoking status: Former Smoker    Packs/day: 0.05    Years: 35.00     Pack years: 1.75    Types: Cigarettes    Quit date: 01/14/2019    Years since quitting: 0.8  . Smokeless tobacco: Never Used  . Tobacco comment: 1 pack last 3 days   Vaping Use  . Vaping Use: Never used  Substance and Sexual Activity  . Alcohol use: No    Alcohol/week: 0.0 standard drinks  . Drug use: Yes    Types: Oxycodone    Comment: prescribed  . Sexual activity: Yes  Other Topics Concern  . Not on file  Social History Narrative  . Not on file   Social Determinants of Health   Financial Resource Strain: Low Risk   . Difficulty of Paying Living Expenses: Not hard at all  Food Insecurity: No Food Insecurity  . Worried About Charity fundraiser in the Last Year: Never true  . Ran Out of Food in the Last Year: Never true  Transportation Needs: No Transportation Needs  . Lack of Transportation (Medical): No  . Lack of Transportation (Non-Medical): No  Physical Activity: Insufficiently Active  . Days of Exercise per Week: 4 days  . Minutes of Exercise per Session: 30 min  Stress: No Stress Concern Present  . Feeling of Stress : Not at all  Social Connections: Socially Integrated  . Frequency of Communication with Friends and Family: More than three times a week  . Frequency of Social Gatherings with Friends and Family: More than three times a week  . Attends Religious Services: More than 4 times per year  . Active Member of Clubs or Organizations: Yes  . Attends Archivist Meetings: More than 4 times per year  . Marital Status: Living with partner  Intimate Partner Violence: Not At Risk  . Fear of Current or Ex-Partner: No  . Emotionally Abused: No  . Physically Abused: No  . Sexually Abused: No    Family History  Problem Relation Age of Onset  . Cancer Father   . Diabetes Sister   . Thrombosis Sister      Current Outpatient Medications:  .  Accu-Chek FastClix Lancets MISC, USE TO TEST BLOOD SUGAR 2X A DAY, Disp: 100 each, Rfl: 12 .  ACCU-CHEK GUIDE  test strip, USE TO TEST BLOOD SUGAR 2X A DAY, Disp: 100 strip, Rfl: 12 .  amLODipine (NORVASC) 5 MG tablet, Take 1 tablet (5 mg total) by mouth 2 (two) times daily., Disp: 180 tablet, Rfl: 1 .  ARNUITY ELLIPTA 100 MCG/ACT AEPB, Inhale 1 puff into the lungs daily., Disp: , Rfl:  .  atorvastatin (LIPITOR) 10 MG tablet, Take 1 tablet (10 mg total) by mouth daily at 6 PM., Disp: 90 tablet, Rfl: 1 .  BREZTRI AEROSPHERE 160-9-4.8 MCG/ACT AERO, Inhale 2 puffs into the lungs 2 (two) times daily. , Disp: , Rfl:  .  diphenhydrAMINE (BENADRYL) 25 MG tablet, Take 50 mg by mouth daily., Disp: , Rfl:  .  Dulaglutide (TRULICITY) 1.5 KZ/6.0FU SOPN, Inject 0.5 mLs (1.5 mg total) into the skin once a week. (Patient taking differently: Inject 1.5 mg into the skin every Wednesday. ), Disp: 4 pen, Rfl: 3 .  esomeprazole (NEXIUM) 40 MG capsule, Take 1 capsule (40 mg total) by mouth daily., Disp: 90 capsule, Rfl: 3 .  hydrALAZINE (APRESOLINE) 100 MG tablet, Take 1 tablet (100 mg total) by mouth 2 (two) times daily., Disp: 180 tablet, Rfl: 1 .  lidocaine (LIDODERM) 5 %, Place 1 patch onto the skin daily. Remove & Discard patch within 12 hours or as directed by MD (Patient taking differently: Place 1 patch onto the skin daily as needed (pain). Remove & Discard patch within 12 hours or as directed by MD), Disp: 30 patch, Rfl: 12 .  lidocaine-prilocaine (EMLA) cream, Apply to affected area once (Patient taking differently: Apply 1 application topically daily as needed (port access). ), Disp: 30 g, Rfl: 3 .  metFORMIN (GLUCOPHAGE) 500 MG tablet, Take 2 tablets (1,000 mg total) by mouth 2 (two) times daily with a meal., Disp: 360 tablet, Rfl: 1 .  metoprolol tartrate (LOPRESSOR) 50 MG tablet, Take 50 mg by mouth 2 (two) times daily., Disp: , Rfl:  .  montelukast (SINGULAIR) 10 MG tablet, Take 10 mg by mouth daily., Disp: , Rfl:  .  [START ON 11/26/2019] morphine (MSIR) 15 MG tablet, Take 1 tablet (15 mg total) by mouth every 6  (six) hours as needed for moderate pain or severe pain. Must last 30 days., Disp: 120 tablet, Rfl: 0 .  [START ON 12/26/2019] morphine (MSIR) 15 MG tablet, Take 1 tablet (15 mg total) by mouth every 6 (six) hours as needed for moderate pain or severe pain. Must last 30 days., Disp: 120 tablet, Rfl: 0 .  [START ON 01/25/2020] morphine (MSIR) 15 MG tablet, Take 1 tablet (15 mg total) by mouth every 6 (six) hours as needed for moderate pain or severe pain. Must last 30 days., Disp: 120 tablet, Rfl: 0 .  Multiple Vitamin (MULTIVITAMIN WITH MINERALS) TABS tablet, Take 1 tablet by mouth daily., Disp: , Rfl:  .  [START ON 01/11/2020] NONFORMULARY OR COMPOUNDED ITEM, Apply 1-2 mLs topically 4 (four) times daily as needed. 10% Ketamine/2% Cyclobenzaprine/6% Gabapentin Cream, Disp: 240 each, Rfl: 12 .  nortriptyline (PAMELOR) 25 MG capsule, TAKE 2 CAPSULES (50 MG TOTAL) BY MOUTH AT BEDTIME., Disp: 180 capsule, Rfl: 1 .  ondansetron (ZOFRAN) 8 MG tablet, Take 1 tablet (8 mg total) by mouth 2 (two) times daily as needed for refractory nausea / vomiting., Disp: 60 tablet, Rfl: 1 .  oxyCODONE (OXY IR/ROXICODONE) 5 MG immediate release tablet, Take 1 tablet (5 mg total) by mouth every 4 (four) hours as needed for moderate pain or severe pain., Disp: 20 tablet, Rfl: 0 .  pantoprazole (PROTONIX) 40 MG tablet, Take 1 tablet (40 mg total) by mouth daily., Disp: 15 tablet, Rfl: 2 .  Potassium Chloride ER 20 MEQ TBCR, TAKE 1 TABLET BY MOUTH EVERY DAY (Patient taking differently: Take 20 mEq by mouth daily. ), Disp: 90 tablet, Rfl: 1 .  prochlorperazine (COMPAZINE) 10 MG tablet, Take 1 tablet (10 mg total) by mouth every 6 (six) hours as needed (Nausea or vomiting)., Disp: 60 tablet, Rfl: 2 .  STIOLTO RESPIMAT 2.5-2.5 MCG/ACT AERS, Inhale 2 puffs into the lungs daily. , Disp: , Rfl:  .  SUMAtriptan (IMITREX) 50 MG tablet, TAKE 1 TAB AT ONSET OF MIGRAINE. MAY REPEAT IN 2 HOURS IF  HEADACHE PERSISTS OR RECURS. (Patient taking  differently: Take 50 mg by mouth every 2 (two) hours as needed for migraine. Take 1 tab at onset of migraine. May repeat in 2 hours if headache persists or recurs.), Disp: 10 tablet, Rfl: 0 No current facility-administered medications for this visit.  Facility-Administered Medications Ordered in Other Visits:  .  0.9 %  sodium chloride infusion, , , Continuous PRN, Dionne Bucy, CRNA, New Bag at 01/22/19 [id number]  Physical exam: There were no vitals filed for this visit. Physical Exam Constitutional:      Appearance: Normal appearance.  HENT:     Head: Normocephalic and atraumatic.  Eyes:     Pupils: Pupils are equal, round, and reactive to light.  Cardiovascular:     Rate and Rhythm: Normal rate and regular rhythm.     Heart sounds: Normal heart sounds. No murmur heard.   Pulmonary:     Effort: Pulmonary effort is normal.     Breath sounds: Normal breath sounds. No wheezing.  Abdominal:     General: Bowel sounds are normal. There is no distension.     Palpations: Abdomen is soft.     Tenderness: There is no abdominal tenderness.  Musculoskeletal:        General: Normal range of motion.     Cervical back: Normal range of motion.  Skin:    General: Skin is warm and dry.     Findings: No rash.  Neurological:     Mental Status: He is alert and oriented to person, place, and time.  Psychiatric:        Judgment: Judgment normal.      CMP Latest Ref Rng & Units 11/11/2019  Glucose 70 - 99 mg/dL 202(H)  BUN 6 - 20 mg/dL 7  Creatinine 0.61 - 1.24 mg/dL 0.70  Sodium 135 - 145 mmol/L 140  Potassium 3.5 - 5.1 mmol/L 3.4(L)  Chloride 98 - 111 mmol/L 101  CO2 22 - 32 mmol/L -  Calcium 8.9 - 10.3 mg/dL -  Total Protein 6.5 - 8.1 g/dL -  Total Bilirubin 0.3 - 1.2 mg/dL -  Alkaline Phos 38 - 126 U/L -  AST 15 - 41 U/L -  ALT 0 - 44 U/L -   CBC Latest Ref Rng & Units 11/11/2019  WBC 4.0 - 10.5 K/uL -  Hemoglobin 13.0 - 17.0 g/dL 12.6(L)  Hematocrit 39 - 52 % 37.0(L)  Platelets 150  - 400 K/uL -    No images are attached to the encounter.  X-ray chest PA or AP  Result Date: 11/11/2019 CLINICAL DATA:  Port-A-Cath placement EXAM: CHEST  1 VIEW COMPARISON:  Chest radiograph November 10, 2019 study obtained earlier in the day FINDINGS: Port-A-Cath tip is in the superior vena cava. No pneumothorax. No edema or airspace opacity. Heart size within normal limits. Pulmonary vascularity normal. No adenopathy appreciable by radiography. No evident bone lesions. IMPRESSION: Port-A-Cath tip in superior vena cava. No pneumothorax. Otherwise no change compared to study obtained earlier in the day. Electronically Signed   By: Lowella Grip III M.D.   On: 11/11/2019 10:51   Chest 2 View  Result Date: 11/10/2019 CLINICAL DATA:  Lung carcinoma. Hypertension. Preoperative Port-A-Cath placement EXAM: CHEST - 2 VIEW COMPARISON:  Chest radiograph October 15, 2019; chest CT October 28, 2019 FINDINGS: No edema or airspace opacity. Heart size and pulmonary vascular normal. Adenopathy noted on recent CT examination is not appreciable by radiography. No evident bone lesions. IMPRESSION: No edema or  airspace opacity. Heart size within normal limits. Adenopathy seen on recent CT not appreciable by radiography. Electronically Signed   By: Lowella Grip III M.D.   On: 11/10/2019 13:35   CT CHEST ABDOMEN PELVIS W CONTRAST  Result Date: 10/28/2019 CLINICAL DATA:  Recurrent squamous cell carcinoma left lung, status post left lower lobectomy with biopsy proven nodal recurrence. EXAM: CT CHEST, ABDOMEN, AND PELVIS WITH CONTRAST TECHNIQUE: Multidetector CT imaging of the chest, abdomen and pelvis was performed following the standard protocol during bolus administration of intravenous contrast. CONTRAST:  197m OMNIPAQUE IOHEXOL 300 MG/ML SOLN, additional oral enteric contrast COMPARISON:  PET-CT, 08/23/2019, CT chest, 08/13/2019 FINDINGS: CT CHEST FINDINGS Cardiovascular: Scattered aortic atherosclerosis. Normal  heart size. No pericardial effusion. Mediastinum/Nodes: No significant interval change in enlarged left hilar and subcarinal lymph nodes, measuring up to 2.4 x 1.9 cm (series 2, image 25). Thyroid gland, trachea, and esophagus demonstrate no significant findings. Lungs/Pleura: Stable 4 mm nodule of the posterior right upper lobe (series 4, image 34). Stable 3 mm pulmonary nodule of the posterior left apex (series 4, image 33). No pleural effusion or pneumothorax. Musculoskeletal: No chest wall mass or suspicious bone lesions identified. CT ABDOMEN PELVIS FINDINGS Hepatobiliary: No solid liver abnormality is seen. Hepatic steatosis. No gallstones, gallbladder wall thickening, or biliary dilatation. Pancreas: Unremarkable. No pancreatic ductal dilatation or surrounding inflammatory changes. Spleen: Normal in size without significant abnormality. Punctuate calcifications consistent with prior granulomatous infection Adrenals/Urinary Tract: Adrenal glands are unremarkable. Kidneys are normal, without renal calculi, solid lesion, or hydronephrosis. Bladder is unremarkable. Stomach/Bowel: Stomach is within normal limits. Appendix is surgically absent. No evidence of bowel wall thickening, distention, or inflammatory changes. Sigmoid diverticulosis. Vascular/Lymphatic: Scattered aortic atherosclerosis. No enlarged abdominal or pelvic lymph nodes. Reproductive: No mass or other abnormality. Other: No abdominal wall hernia or abnormality. No abdominopelvic ascites. Musculoskeletal: No acute or significant osseous findings. IMPRESSION: 1. No significant interval change in enlarged left hilar and subcarinal lymph nodes, in keeping with biopsy-proven nodal metastatic disease. 2. Stable small, nonspecific pulmonary nodules, most likely sequelae of prior infection or inflammation. Attention on follow-up. 3. No evidence of distant metastatic disease within the abdomen or pelvis. 4. Hepatic steatosis. 5. Aortic Atherosclerosis  (ICD10-I70.0). Electronically Signed   By: AEddie CandleM.D.   On: 10/28/2019 16:40   DG C-Arm 1-60 Min-No Report  Result Date: 11/11/2019 Fluoroscopy was utilized by the requesting physician.  No radiographic interpretation.     Assessment and plan- Patient is a 59y.o. male [facility] for starting chemotherapy for the treatment of left lower lobe lung cancer.   1. HPI: Mr. WWeyis a 59year old male with past medical history significant for type 2 diabetes, OSA, emphysema, back pain, hypertension, hyperlipidemia who was diagnosed with stage I lung cancer in the fall 2020.  Had a complicated postoperative lung resection in November 2020.  CT scan from June 2021 showed suspicious subcarinal lymph node.  PET scan from August 23, 2019 revealed hypermetabolism and lymph node high suspicious for recurrence.  Biopsy confirmed stage of disease.  Plan is to begin radiation along with weekly chemo with carbo/Taxol for 6-8 cycles.  He will then began 1 year of maintenance immunotherapy with nivolumab.  We will have a port placed and begin treatment next week.  2. Chemo Care Clinic/High Risk for ER/Hospitalization during chemotherapy- We discussed the role of the chemo care clinic and identified patient specific risk factors. I discussed that  patient was identified as high risk primarily based on: Recurrence and stage of disease.  Patient has past medical history positive for: Past Medical History:  Diagnosis Date  . Allergy   . Arthritis    left foot  . Benign hypertensive kidney disease   . Chronic back pain    Four rods in back  . Diabetes mellitus, type 2 ([place])   . Dyspnea   . GERD (gastroesophageal reflux disease)   . Hypertension   . Malignant neoplasm of lung ([place])   . Migraines    daily    Patient has past surgical history positive for: Past Surgical History:  Procedure Laterality Date  . APPENDECTOMY    . BACK SURGERY    .  COLONOSCOPY WITH PROPOFOL N/A 02/19/2016   Procedure: COLONOSCOPY WITH PROPOFOL;  Surgeon: Lucilla Lame, MD;  Location: [place];  Service: Endoscopy;  Laterality: N/A;  . COLONOSCOPY WITH PROPOFOL N/A 01/19/2018   Procedure: COLONOSCOPY WITH PROPOFOL;  Surgeon: Lucilla Lame, MD;  Location: [place];  Service: Endoscopy;  Laterality: N/A;  Diabetic - oral meds  . DG OPERATIVE LEFT HIP ([place] HX)     10/19  . ELECTROMAGNETIC NAVIGATION BROCHOSCOPY Left 11/18/2018   Procedure: ELECTROMAGNETIC NAVIGATION BRONCHOSCOPY;  Surgeon: Tyler Pita, MD;  Location: [facility];  Service: Cardiopulmonary;  Laterality: Left;  . FLEXIBLE BRONCHOSCOPY Bilateral 01/20/2019   Procedure: FLEXIBLE BRONCHOSCOPY;  Surgeon: Ottie Glazier, MD;  Location: [facility];  Service: Thoracic;  Laterality: Bilateral;  . FLEXIBLE BRONCHOSCOPY Bilateral 01/22/2019   Procedure: FLEXIBLE BRONCHOSCOPY;  Surgeon: Ottie Glazier, MD;  Location: [facility];  Service: Thoracic;  Laterality: Bilateral;  . FOOT SURGERY Left    Screws and plates  . JOINT REPLACEMENT Left 12/2017   DR Rudene Christians Hip  . KNEE SURGERY Left    X 2  . LEG SURGERY    . LUNG CANCER SURGERY    . POLYPECTOMY N/A 02/19/2016   Procedure: POLYPECTOMY;  Surgeon: Lucilla Lame, MD;  Location: [place];  Service: Endoscopy;  Laterality: N/A;  . POLYPECTOMY  01/19/2018   Procedure: POLYPECTOMY;  Surgeon: Lucilla Lame, MD;  Location: [place];  Service: Endoscopy;;  . PORTACATH PLACEMENT N/A 11/11/2019   Procedure: INSERTION PORT-A-CATH;  Surgeon: Nestor Lewandowsky, MD;  Location: [facility];  Service: General;  Laterality: N/A;  . THORACOTOMY Left 01/14/2019   Procedure: THORACOTOMY MAJOR, LEFT;  Surgeon: Nestor Lewandowsky, MD;  Location: [facility];  Service: General;  Laterality: Left;  . TOTAL HIP ARTHROPLASTY Left 12/02/2017   Procedure: TOTAL HIP ARTHROPLASTY ANTERIOR APPROACH;  Surgeon: Hessie Knows, MD;  Location: [facility];  Service:  Orthopedics;  Laterality: Left;  [name] VIDEO BRONCHOSCOPY Left 01/14/2019   Procedure: VIDEO BRONCHOSCOPY WITH FLUORO, LEFT;  Surgeon: Nestor Lewandowsky, MD;  Location: [facility];  Service: General;  Laterality: Left;  [name] VIDEO BRONCHOSCOPY WITH ENDOBRONCHIAL NAVIGATION N/A 10/15/2019   Procedure: VIDEO BRONCHOSCOPY WITH ENDOBRONCHIAL NAVIGATION;  Surgeon: Ottie Glazier, MD;  Location: [facility];  Service: Thoracic;  Laterality: N/A;  . VIDEO BRONCHOSCOPY WITH ENDOBRONCHIAL ULTRASOUND N/A 10/15/2019   Procedure: VIDEO BRONCHOSCOPY WITH ENDOBRONCHIAL ULTRASOUND;  Surgeon: Ottie Glazier, MD;  Location: [facility];  Service: Thoracic;  Laterality: N/A;    Based on our high risk symptom management report; this patient has a high risk of ED utilization.  The percentage below indicates how "at risk "  this patient based on the factors in this table within one year.   General Risk Score: 8  Values used to calculate this score:   Points  Metrics      0        Age: 16      [facility] Admissions: 4      2        ED Visits: 2      1        Has Chronic Obstructive Pulmonary Disease: Yes      1        Has Diabetes: Yes      0        Has Congestive Heart Failure: No      0        Has liver disease: No      0        Has Depression: No      0        Current PCP: [name], DO      1        Has Medicaid: Yes    3. We discussed that social determinants of health may have significant impacts on health and outcomes for cancer patients.  Today we discussed specific social determinants of performance status, alcohol use, depression, financial needs, food insecurity, housing, interpersonal violence, social connections, stress, tobacco use, and transportation.    After lengthy discussion the following were identified as areas of need: None at this time  Outpatient services: We discussed options including home based and outpatient services, DME and care program. We discusssed that patients who participate in  regular physical activity report fewer negative impacts of cancer and treatments and report less fatigue.   Financial Concerns: We discussed that living with cancer can create tremendous financial burden.  We discussed options for assistance. I asked that if assistance is needed in affording medications or paying bills to please let us know so that we can provide assistance. We discussed options for food including social services, [employer] ($50 every 2 weeks) and onsite food pantry.  We will also notify [facility] to see if cancer center can provide additional support.  Referral to Social work: Introduced Education officer, museum Elease Etienne and the services he can provide such as support with [employer], cell phone and gas vouchers.   Support groups: We discussed options for support groups at the cancer center. If interested, please notify nurse navigator to enroll. We discussed options for managing stress including healthy eating, exercise as well as participating in no charge counseling services at the cancer center and support groups.  If these are of interest, patient can notify either myself or primary nursing team.We discussed options for management including medications and referral to quit Smart program  Transportation: We discussed options for transportation including acta, paratransit, bus routes, link transit, taxi/uber/lyft, and cancer center [place].  I have notified primary oncology team who will help assist with arranging Lucianne Lei transportation for appointments when/if needed. We also discussed options for transportation on short notice/acute visits.  Palliative care services: We have palliative care services available in the cancer center to discuss goals of care and advanced care planning.  Please let us know if you have any questions or would like to speak to our palliative nurse practitioner.  Symptom Management Clinic: We discussed our symptom management clinic which is available for  acute concerns while receiving treatment such as nausea, vomiting or diarrhea.  We can be reached via telephone at [phone] or through my chart.  We are  available for virtual or in person visits on the same day from 830 to 4 PM Monday through Friday. He denies needing specific assistance at this time and He will be followed by Dr. Gary Fleet clinical team.  Plan: Discussed symptom management clinic. Discussed palliative care services. Discussed resources that are available here at the cancer center. Discussed medications and new prescriptions to begin treatment such as anti-nausea or steroids.   Disposition: RTC on 11/24/2019  Visit Diagnosis No diagnosis found.  Patient expressed understanding and was in agreement with this plan. He also understands that He can call clinic at any time with any questions, concerns, or complaints.   Greater than 50% was spent in counseling and coordination of care with this patient including but not limited to discussion of the relevant topics above (See A&P) including, but not limited to diagnosis and management of acute and chronic medical conditions.   [facility]  CC: Dr. Grayland Ormond

## 2019-11-21 NOTE — Progress Notes (Signed)
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  INTERVAL HISTORY: Patient returns to clinic today for further evaluation and initiation of weekly carboplatinum and Taxol along with daily XRT.  He continues to feel well and remains asymptomatic. He denies any weakness or fatigue. He has a good appetite and denies weight loss. He has no neurologic complaints.  He denies any recent fevers or illnesses.  He has no chest pain, shortness of breath, cough, or hemoptysis.  He denies any nausea, vomiting, constipation, or diarrhea.  He has no urinary complaints.  Patient offers no specific complaints today.  REVIEW OF SYSTEMS:   Review of Systems  Constitutional: Negative.  Negative for fever, malaise/fatigue and weight loss.  Respiratory: Negative.  Negative for cough and shortness of breath.   Cardiovascular: Negative.  Negative for chest pain and leg swelling.  Gastrointestinal: Negative.  Negative for abdominal pain.  Genitourinary: Negative.  Negative for dysuria and flank pain.  Musculoskeletal: Positive for back pain.  Skin: Negative.  Negative for rash.  Neurological: Negative.  Negative for dizziness, focal weakness, weakness and headaches.  Psychiatric/Behavioral: Negative.  The patient is not nervous/anxious.     As per HPI. Otherwise, a complete review of systems is negative.  PAST MEDICAL HISTORY: Past Medical History:  Diagnosis Date  .  Allergy   . Arthritis    left foot  . Benign hypertensive kidney disease   . Chronic back pain    Four rods in back  . Diabetes mellitus, type 2 ([place])   . Dyspnea   . GERD (gastroesophageal reflux disease)   . Hypertension   . Malignant neoplasm of lung ([place])   . Migraines    daily    PAST SURGICAL HISTORY: Past Surgical History:  Procedure Laterality Date  . APPENDECTOMY    . BACK SURGERY    . COLONOSCOPY WITH PROPOFOL N/A 02/19/2016   Procedure: COLONOSCOPY WITH PROPOFOL;  Surgeon: Lucilla Lame, MD;  Location: [place];  Service: Endoscopy;  Laterality: N/A;  . COLONOSCOPY WITH PROPOFOL N/A 01/19/2018   Procedure: COLONOSCOPY WITH PROPOFOL;  Surgeon: Lucilla Lame, MD;  Location: [place];  Service: Endoscopy;  Laterality: N/A;  Diabetic - oral meds  . DG OPERATIVE LEFT HIP ([place] HX)     10/19  . ELECTROMAGNETIC NAVIGATION BROCHOSCOPY Left 11/18/2018   Procedure: ELECTROMAGNETIC NAVIGATION BRONCHOSCOPY;  Surgeon: Tyler Pita, MD;  Location: [facility];  Service: Cardiopulmonary;  Laterality: Left;  . FLEXIBLE BRONCHOSCOPY Bilateral 01/20/2019   Procedure: FLEXIBLE BRONCHOSCOPY;  Surgeon: Ottie Glazier, MD;  Location: [facility];  Service: Thoracic;  Laterality: Bilateral;  . FLEXIBLE BRONCHOSCOPY Bilateral 01/22/2019   Procedure: FLEXIBLE BRONCHOSCOPY;  Surgeon: Ottie Glazier, MD;  Location: [facility];  Service: Thoracic;  Laterality: Bilateral;  . FOOT SURGERY Left    Screws and plates  . JOINT REPLACEMENT Left 12/2017   DR Rudene Christians Hip  . KNEE SURGERY Left    X 2  . LEG SURGERY    . LUNG CANCER SURGERY    .  POLYPECTOMY N/A 02/19/2016   Procedure: POLYPECTOMY;  Surgeon: Lucilla Lame, MD;  Location: [place];  Service: Endoscopy;  Laterality: N/A;  . POLYPECTOMY  01/19/2018   Procedure: POLYPECTOMY;  Surgeon: Lucilla Lame, MD;  Location: [place];  Service: Endoscopy;;  . PORTACATH PLACEMENT N/A 11/11/2019   Procedure: INSERTION  PORT-A-CATH;  Surgeon: Nestor Lewandowsky, MD;  Location: [facility];  Service: General;  Laterality: N/A;  . THORACOTOMY Left 01/14/2019   Procedure: THORACOTOMY MAJOR, LEFT;  Surgeon: Nestor Lewandowsky, MD;  Location: [facility];  Service: General;  Laterality: Left;  . TOTAL HIP ARTHROPLASTY Left 12/02/2017   Procedure: TOTAL HIP ARTHROPLASTY ANTERIOR APPROACH;  Surgeon: Hessie Knows, MD;  Location: [facility];  Service: Orthopedics;  Laterality: Left;  [name] VIDEO BRONCHOSCOPY Left 01/14/2019   Procedure: VIDEO BRONCHOSCOPY WITH FLUORO, LEFT;  Surgeon: Nestor Lewandowsky, MD;  Location: [facility];  Service: General;  Laterality: Left;  [name] VIDEO BRONCHOSCOPY WITH ENDOBRONCHIAL NAVIGATION N/A 10/15/2019   Procedure: VIDEO BRONCHOSCOPY WITH ENDOBRONCHIAL NAVIGATION;  Surgeon: Ottie Glazier, MD;  Location: [facility];  Service: Thoracic;  Laterality: N/A;  . VIDEO BRONCHOSCOPY WITH ENDOBRONCHIAL ULTRASOUND N/A 10/15/2019   Procedure: VIDEO BRONCHOSCOPY WITH ENDOBRONCHIAL ULTRASOUND;  Surgeon: Ottie Glazier, MD;  Location: [facility];  Service: Thoracic;  Laterality: N/A;    FAMILY HISTORY: Family History  Problem Relation Age of Onset  . Cancer Father   . Diabetes Sister   . Thrombosis Sister     ADVANCED DIRECTIVES (Y/N):  N  HEALTH MAINTENANCE: Social History   Tobacco Use  . Smoking status: Former Smoker    Packs/day: 0.05    Years: 35.00    Pack years: 1.75    Types: Cigarettes    Quit date: 01/14/2019    Years since quitting: 0.8  . Smokeless tobacco: Never Used  . Tobacco comment: 1 pack last 3 days   Vaping Use  . Vaping Use: Never used  Substance Use Topics  . Alcohol use: No    Alcohol/week: 0.0 standard drinks  . Drug use: Yes    Types: Oxycodone    Comment: prescribed     Colonoscopy:  PAP:  Bone density:  Lipid panel:  Allergies  Allergen Reactions  . Acetaminophen Swelling  . Aspirin Anaphylaxis  . Epinephrine Anaphylaxis    Does not include albuterol  . Novocain [Procaine]  Anaphylaxis  . Penicillins Anaphylaxis    Has patient had a PCN reaction causing immediate rash, facial/tongue/throat swelling, SOB or lightheadedness with hypotension: Yes Has patient had a PCN reaction causing severe rash involving mucus membranes or skin necrosis: No Has patient had a PCN reaction that required hospitalization: Yes Has patient had a PCN reaction occurring within the last 10 years: No If all of the above answers are "NO", then may proceed with Cephalosporin use.   . Strawberry Extract Anaphylaxis  . Shellfish Allergy Hives and Nausea And Vomiting    Current Outpatient Medications  Medication Sig Dispense Refill  . Accu-Chek FastClix Lancets MISC USE TO TEST BLOOD SUGAR 2X A DAY 100 each 12  . ACCU-CHEK GUIDE test strip USE TO TEST BLOOD SUGAR 2X A DAY 100 strip 12  . amLODipine (NORVASC) 5 MG tablet Take 1 tablet (5 mg total) by mouth 2 (two) times daily. 180 tablet 1  . ARNUITY ELLIPTA 100 MCG/ACT AEPB Inhale 1 puff into the lungs daily.    [name] atorvastatin (LIPITOR) 10 MG tablet Take 1 tablet (10 mg total) by mouth daily at 6 PM. 90  tablet 1  . BREZTRI AEROSPHERE 160-9-4.8 MCG/ACT AERO Inhale 2 puffs into the lungs 2 (two) times daily.     . diphenhydrAMINE (BENADRYL) 25 MG tablet Take 50 mg by mouth daily.    . Dulaglutide (TRULICITY) 1.5 RC/7.8LF SOPN Inject 0.5 mLs (1.5 mg total) into the skin once a week. (Patient taking differently: Inject 1.5 mg into the skin every Wednesday. ) 4 pen 3  . esomeprazole (NEXIUM) 40 MG capsule Take 1 capsule (40 mg total) by mouth daily. 90 capsule 3  . hydrALAZINE (APRESOLINE) 100 MG tablet Take 1 tablet (100 mg total) by mouth 2 (two) times daily. 180 tablet 1  . lidocaine (LIDODERM) 5 % Place 1 patch onto the skin daily. Remove & Discard patch within 12 hours or as directed by MD (Patient taking differently: Place 1 patch onto the skin daily as needed (pain). Remove & Discard patch within 12 hours or as directed by MD) 30 patch 12  .  lidocaine-prilocaine (EMLA) cream Apply to affected area once (Patient taking differently: Apply 1 application topically daily as needed (port access). ) 30 g 3  . metFORMIN (GLUCOPHAGE) 500 MG tablet Take 2 tablets (1,000 mg total) by mouth 2 (two) times daily with a meal. 360 tablet 1  . metoprolol tartrate (LOPRESSOR) 50 MG tablet Take 50 mg by mouth 2 (two) times daily.    . montelukast (SINGULAIR) 10 MG tablet Take 10 mg by mouth daily.    Derrill Memo ON 11/26/2019] morphine (MSIR) 15 MG tablet Take 1 tablet (15 mg total) by mouth every 6 (six) hours as needed for moderate pain or severe pain. Must last 30 days. 120 tablet 0  . [START ON 12/26/2019] morphine (MSIR) 15 MG tablet Take 1 tablet (15 mg total) by mouth every 6 (six) hours as needed for moderate pain or severe pain. Must last 30 days. 120 tablet 0  . [START ON 01/25/2020] morphine (MSIR) 15 MG tablet Take 1 tablet (15 mg total) by mouth every 6 (six) hours as needed for moderate pain or severe pain. Must last 30 days. 120 tablet 0  . Multiple Vitamin (MULTIVITAMIN WITH MINERALS) TABS tablet Take 1 tablet by mouth daily.    Derrill Memo ON 01/11/2020] NONFORMULARY OR COMPOUNDED ITEM Apply 1-2 mLs topically 4 (four) times daily as needed. 10% Ketamine/2% Cyclobenzaprine/6% Gabapentin Cream 240 each 12  . nortriptyline (PAMELOR) 25 MG capsule TAKE 2 CAPSULES (50 MG TOTAL) BY MOUTH AT BEDTIME. 180 capsule 1  . ondansetron (ZOFRAN) 8 MG tablet Take 1 tablet (8 mg total) by mouth 2 (two) times daily as needed for refractory nausea / vomiting. 60 tablet 1  . oxyCODONE (OXY IR/ROXICODONE) 5 MG immediate release tablet Take 1 tablet (5 mg total) by mouth every 4 (four) hours as needed for moderate pain or severe pain. 20 tablet 0  . pantoprazole (PROTONIX) 40 MG tablet Take 1 tablet (40 mg total) by mouth daily. 15 tablet 2  . Potassium Chloride ER 20 MEQ TBCR TAKE 1 TABLET BY MOUTH EVERY DAY (Patient taking differently: Take 20 mEq by mouth daily. ) 90  tablet 1  . prochlorperazine (COMPAZINE) 10 MG tablet Take 1 tablet (10 mg total) by mouth every 6 (six) hours as needed (Nausea or vomiting). 60 tablet 2  . STIOLTO RESPIMAT 2.5-2.5 MCG/ACT AERS Inhale 2 puffs into the lungs daily.     . SUMAtriptan (IMITREX) 50 MG tablet TAKE 1 TAB AT ONSET OF MIGRAINE. MAY REPEAT IN 2 HOURS IF  HEADACHE PERSISTS OR RECURS. (Patient taking differently: Take 50 mg by mouth every 2 (two) hours as needed for migraine. Take 1 tab at onset of migraine. May repeat in 2 hours if headache persists or recurs.) 10 tablet 0   No current facility-administered medications for this visit.   Facility-Administered Medications Ordered in Other Visits  Medication Dose Route Frequency Provider Last Rate Last Admin  . 0.9 %  sodium chloride infusion    Continuous PRN Dionne Bucy, CRNA   New Bag at 01/22/19 0705    OBJECTIVE: Vitals:   11/24/19 0835  BP: 128/84  Pulse: 69  Resp: 20  Temp: 98.3 F (36.8 C)  SpO2: 99%     Body mass index is 33.2 kg/m.    ECOG FS:0 - Asymptomatic  General: Well-developed, well-nourished, no acute distress. Eyes: Pink conjunctiva, anicteric sclera. HEENT: Normocephalic, moist mucous membranes. Lungs: No audible wheezing or coughing. Heart: Regular rate and rhythm. Abdomen: Soft, nontender, no obvious distention. Musculoskeletal: No edema, cyanosis, or clubbing. Neuro: Alert, answering all questions appropriately. Cranial nerves grossly intact. Skin: No rashes or petechiae noted. Psych: Normal affect.   LAB RESULTS:  Lab Results  Component Value Date   NA 138 11/24/2019   K 3.7 11/24/2019   CL 102 11/24/2019   CO2 26 11/24/2019   GLUCOSE 190 (H) 11/24/2019   BUN 9 11/24/2019   CREATININE 0.81 11/24/2019   CALCIUM 8.9 11/24/2019   PROT 7.4 11/24/2019   ALBUMIN 4.1 11/24/2019   AST 57 (H) 11/24/2019   ALT 46 (H) 11/24/2019   ALKPHOS 113 11/24/2019   BILITOT 0.6 11/24/2019   GFRNONAA >60 11/24/2019   GFRAA >60  11/24/2019    Lab Results  Component Value Date   WBC 6.7 11/24/2019   NEUTROABS 3.8 11/24/2019   HGB 12.5 (L) 11/24/2019   HCT 36.9 (L) 11/24/2019   MCV 86.8 11/24/2019   PLT 167 11/24/2019     STUDIES: X-ray chest PA or AP  Result Date: 11/11/2019 CLINICAL DATA:  Port-A-Cath placement EXAM: CHEST  1 VIEW COMPARISON:  Chest radiograph November 10, 2019 study obtained earlier in the day FINDINGS: Port-A-Cath tip is in the superior vena cava. No pneumothorax. No edema or airspace opacity. Heart size within normal limits. Pulmonary vascularity normal. No adenopathy appreciable by radiography. No evident bone lesions. IMPRESSION: Port-A-Cath tip in superior vena cava. No pneumothorax. Otherwise no change compared to study obtained earlier in the day. Electronically Signed   By: Lowella Grip III M.D.   On: 11/11/2019 10:51   Chest 2 View  Result Date: 11/10/2019 CLINICAL DATA:  Lung carcinoma. Hypertension. Preoperative Port-A-Cath placement EXAM: CHEST - 2 VIEW COMPARISON:  Chest radiograph October 15, 2019; chest CT October 28, 2019 FINDINGS: No edema or airspace opacity. Heart size and pulmonary vascular normal. Adenopathy noted on recent CT examination is not appreciable by radiography. No evident bone lesions. IMPRESSION: No edema or airspace opacity. Heart size within normal limits. Adenopathy seen on recent CT not appreciable by radiography. Electronically Signed   By: Lowella Grip III M.D.   On: 11/10/2019 13:35   CT CHEST ABDOMEN PELVIS W CONTRAST  Result Date: 10/28/2019 CLINICAL DATA:  Recurrent squamous cell carcinoma left lung, status post left lower lobectomy with biopsy proven nodal recurrence. EXAM: CT CHEST, ABDOMEN, AND PELVIS WITH CONTRAST TECHNIQUE: Multidetector CT imaging of the chest, abdomen and pelvis was performed following the standard protocol during bolus administration of intravenous contrast. CONTRAST:  152m OMNIPAQUE IOHEXOL 300 MG/ML SOLN,  additional oral  enteric contrast COMPARISON:  PET-CT, 08/23/2019, CT chest, 08/13/2019 FINDINGS: CT CHEST FINDINGS Cardiovascular: Scattered aortic atherosclerosis. Normal heart size. No pericardial effusion. Mediastinum/Nodes: No significant interval change in enlarged left hilar and subcarinal lymph nodes, measuring up to 2.4 x 1.9 cm (series 2, image 25). Thyroid gland, trachea, and esophagus demonstrate no significant findings. Lungs/Pleura: Stable 4 mm nodule of the posterior right upper lobe (series 4, image 34). Stable 3 mm pulmonary nodule of the posterior left apex (series 4, image 33). No pleural effusion or pneumothorax. Musculoskeletal: No chest wall mass or suspicious bone lesions identified. CT ABDOMEN PELVIS FINDINGS Hepatobiliary: No solid liver abnormality is seen. Hepatic steatosis. No gallstones, gallbladder wall thickening, or biliary dilatation. Pancreas: Unremarkable. No pancreatic ductal dilatation or surrounding inflammatory changes. Spleen: Normal in size without significant abnormality. Punctuate calcifications consistent with prior granulomatous infection Adrenals/Urinary Tract: Adrenal glands are unremarkable. Kidneys are normal, without renal calculi, solid lesion, or hydronephrosis. Bladder is unremarkable. Stomach/Bowel: Stomach is within normal limits. Appendix is surgically absent. No evidence of bowel wall thickening, distention, or inflammatory changes. Sigmoid diverticulosis. Vascular/Lymphatic: Scattered aortic atherosclerosis. No enlarged abdominal or pelvic lymph nodes. Reproductive: No mass or other abnormality. Other: No abdominal wall hernia or abnormality. No abdominopelvic ascites. Musculoskeletal: No acute or significant osseous findings. IMPRESSION: 1. No significant interval change in enlarged left hilar and subcarinal lymph nodes, in keeping with biopsy-proven nodal metastatic disease. 2. Stable small, nonspecific pulmonary nodules, most likely sequelae of prior infection or  inflammation. Attention on follow-up. 3. No evidence of distant metastatic disease within the abdomen or pelvis. 4. Hepatic steatosis. 5. Aortic Atherosclerosis (ICD10-I70.0). Electronically Signed   By: Eddie Candle M.D.   On: 10/28/2019 16:40   DG C-Arm 1-60 Min-No Report  Result Date: 11/11/2019 Fluoroscopy was utilized by the requesting physician.  No radiographic interpretation.    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  PLAN:    1.  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  He had a complicated postoperative course. CT scan results from August 14, 2019 reviewed independently with a suspicious subcarinal lymph node measuring 1.4 cm.  Follow-up PET scan on August 23, 2019 revealed hypermetabolism in lymph node highly suspicious for recurrence.  Biopsy confirmed recurrence increasing patient's stage from IA up to IIIA. Will give weekly carboplatinum and Taxol concurrently with XRT for 6-8 cycles.  At the conclusion of his concurrent chemo/XRT, patient will benefit from 1 year of maintenance immunotherapy using durvalumab.  Proceed with cycle 1 of weekly carboplatin and Taxol today.  Continue daily XRT.  Return to clinic in 1 week for further evaluation and consideration of cycle 2. 2.  Pain: Chronic and unrelated to his malignancy.  Continue monitoring and treatment per pain clinic. 3.  Nausea: Patient asked if it were okay to take CBD Gummies for nausea.  From an oncology standpoint this would be okay, but patient did state concern that it would show a positive urine test.  He was instructed to let pain clinic know that he was taking this for oncologic purposes.   Patient expressed understanding and was in agreement with this plan. He also understands that He can call clinic at any time with any questions, concerns, or complaints.   Cancer Staging Squamous cell  carcinoma of left lung [facility]) Staging form: Lung, AJCC 8th Edition - Clinical stage from 02/12/2019: Stage IIIA (  cT1b, cN2, cM0) - Signed by [name], MD on 11/01/2019   [name], MD   11/25/2019 11:08 AM

## 2019-11-22 ENCOUNTER — Ambulatory Visit
Admission: RE | Admit: 2019-11-22 | Discharge: 2019-11-22 | Disposition: A | Discharge status: Home / Self Care | Payer: Medicare Other | Source: Ambulatory Visit | Attending: Radiation Oncology | Admitting: Radiation Oncology

## 2019-11-22 ENCOUNTER — Encounter: Payer: Medicare Other | Admitting: Pain Medicine

## 2019-11-22 DIAGNOSIS — C3432 Malignant neoplasm of lower lobe, left bronchus or lung: Secondary | ICD-10-CM | POA: Diagnosis not present

## 2019-11-22 DIAGNOSIS — Z510 Encounter for antineoplastic radiation therapy: Secondary | ICD-10-CM | POA: Diagnosis not present

## 2019-11-23 ENCOUNTER — Other Ambulatory Visit: Payer: Self-pay | Admitting: Oncology

## 2019-11-23 ENCOUNTER — Encounter: Payer: Self-pay | Admitting: Oncology

## 2019-11-23 ENCOUNTER — Ambulatory Visit: Payer: Medicare Other | Admitting: Family Medicine

## 2019-11-23 ENCOUNTER — Ambulatory Visit
Admission: RE | Admit: 2019-11-23 | Discharge: 2019-11-23 | Disposition: A | Discharge status: Home / Self Care | Payer: Medicare Other | Source: Ambulatory Visit | Attending: Radiation Oncology | Admitting: Radiation Oncology

## 2019-11-23 DIAGNOSIS — C3432 Malignant neoplasm of lower lobe, left bronchus or lung: Secondary | ICD-10-CM | POA: Diagnosis not present

## 2019-11-23 DIAGNOSIS — Z510 Encounter for antineoplastic radiation therapy: Secondary | ICD-10-CM | POA: Diagnosis not present

## 2019-11-23 NOTE — Progress Notes (Signed)
Patient would like to discuss if there is anything he can take to help his appetite. Such as CBG gummies or other herbal remedies that would help with his appetite. He states he doesn't want to do anything that would affect his current pain management. He denies any pain or other issues at this time.

## 2019-11-24 ENCOUNTER — Other Ambulatory Visit: Payer: Self-pay

## 2019-11-24 ENCOUNTER — Ambulatory Visit
Admission: RE | Admit: 2019-11-24 | Discharge: 2019-11-24 | Disposition: A | Discharge status: Home / Self Care | Payer: Medicare Other | Source: Ambulatory Visit | Attending: Radiation Oncology | Admitting: Radiation Oncology

## 2019-11-24 ENCOUNTER — Inpatient Hospital Stay (HOSPITAL_BASED_OUTPATIENT_CLINIC_OR_DEPARTMENT_OTHER): Payer: Medicare Other | Admitting: Oncology

## 2019-11-24 ENCOUNTER — Inpatient Hospital Stay: Payer: Medicare Other

## 2019-11-24 ENCOUNTER — Encounter: Payer: Self-pay | Admitting: Oncology

## 2019-11-24 VITALS — BP 124/89 | HR 68 | Temp 97.6°F | Resp 18

## 2019-11-24 VITALS — BP 128/84 | HR 69 | Temp 98.3°F | Resp 20 | Ht 70.0 in | Wt 231.4 lb

## 2019-11-24 DIAGNOSIS — C3492 Malignant neoplasm of unspecified part of left bronchus or lung: Secondary | ICD-10-CM

## 2019-11-24 DIAGNOSIS — M549 Dorsalgia, unspecified: Secondary | ICD-10-CM | POA: Diagnosis not present

## 2019-11-24 DIAGNOSIS — I129 Hypertensive chronic kidney disease with stage 1 through stage 4 chronic kidney disease, or unspecified chronic kidney disease: Secondary | ICD-10-CM | POA: Diagnosis not present

## 2019-11-24 DIAGNOSIS — C3432 Malignant neoplasm of lower lobe, left bronchus or lung: Secondary | ICD-10-CM | POA: Diagnosis not present

## 2019-11-24 DIAGNOSIS — G4733 Obstructive sleep apnea (adult) (pediatric): Secondary | ICD-10-CM | POA: Diagnosis not present

## 2019-11-24 DIAGNOSIS — N181 Chronic kidney disease, stage 1: Secondary | ICD-10-CM | POA: Diagnosis not present

## 2019-11-24 DIAGNOSIS — Z79899 Other long term (current) drug therapy: Secondary | ICD-10-CM | POA: Diagnosis not present

## 2019-11-24 DIAGNOSIS — E785 Hyperlipidemia, unspecified: Secondary | ICD-10-CM | POA: Diagnosis not present

## 2019-11-24 DIAGNOSIS — E1122 Type 2 diabetes mellitus with diabetic chronic kidney disease: Secondary | ICD-10-CM | POA: Diagnosis not present

## 2019-11-24 DIAGNOSIS — Z510 Encounter for antineoplastic radiation therapy: Secondary | ICD-10-CM | POA: Diagnosis not present

## 2019-11-24 LAB — CBC WITH DIFFERENTIAL/PLATELET
Abs Immature Granulocytes: 0.04 10*3/uL (ref 0.00–0.07)
Basophils Absolute: 0.1 10*3/uL (ref 0.0–0.1)
Basophils Relative: 1 %
Eosinophils Absolute: 0.1 10*3/uL (ref 0.0–0.5)
Eosinophils Relative: 1 %
HCT: 36.9 % — ABNORMAL LOW (ref 39.0–52.0)
Hemoglobin: 12.5 g/dL — ABNORMAL LOW (ref 13.0–17.0)
Immature Granulocytes: 1 %
Lymphocytes Relative: 33 %
Lymphs Abs: 2.2 10*3/uL (ref 0.7–4.0)
MCH: 29.4 pg (ref 26.0–34.0)
MCHC: 33.9 g/dL (ref 30.0–36.0)
MCV: 86.8 fL (ref 80.0–100.0)
Monocytes Absolute: 0.5 10*3/uL (ref 0.1–1.0)
Monocytes Relative: 8 %
Neutro Abs: 3.8 10*3/uL (ref 1.7–7.7)
Neutrophils Relative %: 56 %
Platelets: 167 10*3/uL (ref 150–400)
RBC: 4.25 MIL/uL (ref 4.22–5.81)
RDW: 12.9 % (ref 11.5–15.5)
WBC: 6.7 10*3/uL (ref 4.0–10.5)
nRBC: 0 % (ref 0.0–0.2)

## 2019-11-24 LAB — COMPREHENSIVE METABOLIC PANEL
ALT: 46 U/L — ABNORMAL HIGH (ref 0–44)
AST: 57 U/L — ABNORMAL HIGH (ref 15–41)
Albumin: 4.1 g/dL (ref 3.5–5.0)
Alkaline Phosphatase: 113 U/L (ref 38–126)
Anion gap: 10 (ref 5–15)
BUN: 9 mg/dL (ref 6–20)
CO2: 26 mmol/L (ref 22–32)
Calcium: 8.9 mg/dL (ref 8.9–10.3)
Chloride: 102 mmol/L (ref 98–111)
Creatinine, Ser: 0.81 mg/dL (ref 0.61–1.24)
GFR calc Af Amer: >60 mL/min (ref >60)
GFR calc non Af Amer: >60 mL/min (ref >60)
Glucose, Bld: 190 mg/dL — ABNORMAL HIGH (ref 70–99)
Potassium: 3.7 mmol/L (ref 3.5–5.1)
Sodium: 138 mmol/L (ref 135–145)
Total Bilirubin: 0.6 mg/dL (ref 0.3–1.2)
Total Protein: 7.4 g/dL (ref 6.5–8.1)

## 2019-11-24 MED ORDER — SODIUM CHLORIDE 0.9 % IV SOLN
Freq: Once | INTRAVENOUS | Status: AC
  Filled 2019-11-24: qty 250

## 2019-11-24 MED ORDER — SODIUM CHLORIDE 0.9% FLUSH
10.0000 mL | INTRAVENOUS | Status: DC | PRN
  Administered 2019-11-24: 10 mL via INTRAVENOUS
  Filled 2019-11-24: qty 10

## 2019-11-24 MED ORDER — HEPARIN SOD (PORK) LOCK FLUSH 100 UNIT/ML IV SOLN
500.0000 [IU] | Freq: Once | INTRAVENOUS | Status: DC | PRN
  Filled 2019-11-24: qty 5

## 2019-11-24 MED ORDER — SODIUM CHLORIDE 0.9 % IV SOLN
300.0000 mg | Freq: Once | INTRAVENOUS | Status: AC
  Administered 2019-11-24: 300 mg via INTRAVENOUS
  Filled 2019-11-24: qty 30

## 2019-11-24 MED ORDER — DIPHENHYDRAMINE HCL 50 MG/ML IJ SOLN
25.0000 mg | Freq: Once | INTRAMUSCULAR | Status: AC
  Administered 2019-11-24: 25 mg via INTRAVENOUS
  Filled 2019-11-24: qty 1

## 2019-11-24 MED ORDER — SODIUM CHLORIDE 0.9 % IV SOLN
10.0000 mg | Freq: Once | INTRAVENOUS | Status: AC
  Administered 2019-11-24: 10 mg via INTRAVENOUS
  Filled 2019-11-24: qty 10

## 2019-11-24 MED ORDER — HEPARIN SOD (PORK) LOCK FLUSH 100 UNIT/ML IV SOLN
INTRAVENOUS | Status: AC
  Filled 2019-11-24: qty 5

## 2019-11-24 MED ORDER — PALONOSETRON HCL INJECTION 0.25 MG/5ML
0.2500 mg | Freq: Once | INTRAVENOUS | Status: AC
  Administered 2019-11-24: 0.25 mg via INTRAVENOUS
  Filled 2019-11-24: qty 5

## 2019-11-24 MED ORDER — HEPARIN SOD (PORK) LOCK FLUSH 100 UNIT/ML IV SOLN
500.0000 [IU] | Freq: Once | INTRAVENOUS | Status: AC
  Administered 2019-11-24: 500 [IU] via INTRAVENOUS
  Filled 2019-11-24: qty 5

## 2019-11-24 MED ORDER — SODIUM CHLORIDE 0.9 % IV SOLN
45.0000 mg/m2 | Freq: Once | INTRAVENOUS | Status: AC
  Administered 2019-11-24: 102 mg via INTRAVENOUS
  Filled 2019-11-24: qty 17

## 2019-11-24 MED ORDER — FAMOTIDINE IN NACL 20-0.9 MG/50ML-% IV SOLN
20.0000 mg | Freq: Once | INTRAVENOUS | Status: AC
  Administered 2019-11-24: 20 mg via INTRAVENOUS
  Filled 2019-11-24: qty 50

## 2019-11-24 NOTE — Progress Notes (Signed)
Pt tolerated infusion well. No s/s of distress or reaction noted. Pt and VS stable at discharge.  

## 2019-11-25 ENCOUNTER — Ambulatory Visit
Admission: RE | Admit: 2019-11-25 | Discharge: 2019-11-25 | Disposition: A | Discharge status: Home / Self Care | Payer: Medicare Other | Source: Ambulatory Visit | Attending: Radiation Oncology | Admitting: Radiation Oncology

## 2019-11-25 DIAGNOSIS — Z510 Encounter for antineoplastic radiation therapy: Secondary | ICD-10-CM | POA: Diagnosis not present

## 2019-11-25 DIAGNOSIS — C3432 Malignant neoplasm of lower lobe, left bronchus or lung: Secondary | ICD-10-CM | POA: Diagnosis not present

## 2019-11-26 ENCOUNTER — Ambulatory Visit
Admission: RE | Admit: 2019-11-26 | Discharge: 2019-11-26 | Disposition: A | Discharge status: Home / Self Care | Payer: Medicare Other | Source: Ambulatory Visit | Attending: Radiation Oncology | Admitting: Radiation Oncology

## 2019-11-26 ENCOUNTER — Encounter: Payer: Self-pay | Admitting: Family Medicine

## 2019-11-26 ENCOUNTER — Telehealth: Payer: Self-pay

## 2019-11-26 ENCOUNTER — Ambulatory Visit: Payer: Medicare Other | Admitting: Pharmacist

## 2019-11-26 ENCOUNTER — Other Ambulatory Visit: Payer: Self-pay

## 2019-11-26 ENCOUNTER — Ambulatory Visit (INDEPENDENT_AMBULATORY_CARE_PROVIDER_SITE_OTHER): Payer: Medicare Other | Admitting: Family Medicine

## 2019-11-26 VITALS — BP 133/77 | HR 87 | Temp 98.6°F | Wt 234.0 lb

## 2019-11-26 DIAGNOSIS — E1169 Type 2 diabetes mellitus with other specified complication: Secondary | ICD-10-CM

## 2019-11-26 DIAGNOSIS — J439 Emphysema, unspecified: Secondary | ICD-10-CM

## 2019-11-26 DIAGNOSIS — E1122 Type 2 diabetes mellitus with diabetic chronic kidney disease: Secondary | ICD-10-CM | POA: Diagnosis not present

## 2019-11-26 DIAGNOSIS — N181 Chronic kidney disease, stage 1: Secondary | ICD-10-CM | POA: Diagnosis not present

## 2019-11-26 DIAGNOSIS — C771 Secondary and unspecified malignant neoplasm of intrathoracic lymph nodes: Secondary | ICD-10-CM | POA: Diagnosis not present

## 2019-11-26 DIAGNOSIS — E785 Hyperlipidemia, unspecified: Secondary | ICD-10-CM | POA: Diagnosis not present

## 2019-11-26 DIAGNOSIS — C3432 Malignant neoplasm of lower lobe, left bronchus or lung: Secondary | ICD-10-CM | POA: Diagnosis not present

## 2019-11-26 DIAGNOSIS — I129 Hypertensive chronic kidney disease with stage 1 through stage 4 chronic kidney disease, or unspecified chronic kidney disease: Secondary | ICD-10-CM

## 2019-11-26 DIAGNOSIS — Z510 Encounter for antineoplastic radiation therapy: Secondary | ICD-10-CM | POA: Diagnosis not present

## 2019-11-26 DIAGNOSIS — I10 Essential (primary) hypertension: Secondary | ICD-10-CM

## 2019-11-26 MED ORDER — TRULICITY 1.5 MG/0.5ML ~~LOC~~ SOAJ: 1.5000 mg | SUBCUTANEOUS | 1 refills | Status: AC

## 2019-11-26 MED ORDER — SUMATRIPTAN SUCCINATE 50 MG PO TABS: 50.0000 mg | ORAL_TABLET | ORAL | 12 refills | Status: DC | PRN

## 2019-11-26 MED ORDER — EMPAGLIFLOZIN 25 MG PO TABS: 25.0000 mg | ORAL_TABLET | Freq: Every day | ORAL | 3 refills | Status: DC

## 2019-11-26 NOTE — Progress Notes (Signed)
BP 133/77   Pulse 87   Temp 98.6 F (37 C) (Oral)   Wt 234 lb (106.1 kg)   SpO2 97%   BMI 33.58 kg/m    Subjective:    Patient ID: Kristopher [name], male    DOB: [birth year], 59 y.o.   MRN: [id number]  HPI: Kristopher [name] is a 59 y.o. male  Chief Complaint  Patient presents with  . Diabetes   DIABETES Hypoglycemic episodes:no Polydipsia/polyuria: no Visual disturbance: no Chest pain: no Paresthesias: no Glucose Monitoring: no  Accucheck frequency: Not Checking Taking Insulin?: no Blood Pressure Monitoring: not checking Retinal Examination: Up to Date Foot Exam: Up to Date Diabetic Education: Completed Pneumovax: Not up to Date Influenza: Not up to Date  HYPERTENSION / HYPERLIPIDEMIA Satisfied with current treatment? yes Duration of hypertension: chronic BP monitoring frequency: not checking BP medication side effects: no Duration of hyperlipidemia: chronic Cholesterol medication side effects: no Cholesterol supplements: none Past cholesterol medications: atorvastatin Medication compliance: excellent compliance Recent stressors: no Recurrent headaches: no Visual changes: no Palpitations: no Dyspnea: no Chest pain: no Lower extremity edema: no Dizzy/lightheaded: no  Relevant past medical, surgical, family and social history reviewed and updated as indicated. Interim medical history since our last visit reviewed. Allergies and medications reviewed and updated.  Review of Systems  Constitutional: Negative.   Respiratory: Negative.   Cardiovascular: Negative.   Gastrointestinal: Negative.   Musculoskeletal: Negative.   Neurological: Negative.   Psychiatric/Behavioral: Negative.     Per HPI unless specifically indicated above     Objective:    BP 133/77   Pulse 87   Temp 98.6 F (37 C) (Oral)   Wt 234 lb (106.1 kg)   SpO2 97%   BMI 33.58 kg/m   Wt Readings from Last 3 Encounters:  11/26/19 234 lb (106.1 kg)  11/24/19 231 lb 6.4 oz (105  kg)  11/11/19 231 lb 11.3 oz (105.1 kg)    Physical Exam Vitals and nursing note reviewed.  Constitutional:      General: He is not in acute distress.    Appearance: Normal appearance. He is not ill-appearing, toxic-appearing or diaphoretic.  HENT:     Head: Normocephalic and atraumatic.     Right Ear: External ear normal.     Left Ear: External ear normal.     Nose: Nose normal.     Mouth/Throat:     Mouth: Mucous membranes are moist.     Pharynx: Oropharynx is clear.  Eyes:     General: No scleral icterus.       Right eye: No discharge.        Left eye: No discharge.     Extraocular Movements: Extraocular movements intact.     Conjunctiva/sclera: Conjunctivae normal.     Pupils: Pupils are equal, round, and reactive to light.  Cardiovascular:     Rate and Rhythm: Normal rate and regular rhythm.     Pulses: Normal pulses.     Heart sounds: Normal heart sounds. No murmur heard.  No friction rub. No gallop.   Pulmonary:     Effort: Pulmonary effort is normal. No respiratory distress.     Breath sounds: No stridor. Wheezing (L lung) present. No rhonchi or rales.  Chest:     Chest wall: No tenderness.  Musculoskeletal:        General: Normal range of motion.     Cervical back: Normal range of motion and neck supple.  Skin:    General: Skin  is warm and dry.     Capillary Refill: Capillary refill takes less than 2 seconds.     Coloration: Skin is not jaundiced or pale.     Findings: No bruising, erythema, lesion or rash.  Neurological:     General: No focal deficit present.     Mental Status: He is alert and oriented to person, place, and time. Mental status is at baseline.  Psychiatric:        Mood and Affect: Mood normal.        Behavior: Behavior normal.        Thought Content: Thought content normal.        Judgment: Judgment normal.     Results for orders placed or performed in visit on 11/24/19  Comprehensive metabolic panel  Result Value Ref Range   Sodium 138  135 - 145 mmol/L   Potassium 3.7 3.5 - 5.1 mmol/L   Chloride 102 98 - 111 mmol/L   CO2 26 22 - 32 mmol/L   Glucose, Bld 190 (H) 70 - 99 mg/dL   BUN 9 6 - 20 mg/dL   Creatinine, Ser 0.81 0.61 - 1.24 mg/dL   Calcium 8.9 8.9 - 10.3 mg/dL   Total Protein 7.4 6.5 - 8.1 g/dL   Albumin 4.1 3.5 - 5.0 g/dL   AST 57 (H) 15 - 41 U/L   ALT 46 (H) 0 - 44 U/L   Alkaline Phosphatase 113 38 - 126 U/L   Total Bilirubin 0.6 0.3 - 1.2 mg/dL   GFR calc non Af Amer >60 >60 mL/min   GFR calc Af Amer >60 >60 mL/min   Anion gap 10 5 - 15  CBC with Differential  Result Value Ref Range   WBC 6.7 4.0 - 10.5 K/uL   RBC 4.25 4.22 - 5.81 MIL/uL   Hemoglobin 12.5 (L) 13.0 - 17.0 g/dL   HCT 36.9 (L) 39 - 52 %   MCV 86.8 80.0 - 100.0 fL   MCH 29.4 26.0 - 34.0 pg   MCHC 33.9 30.0 - 36.0 g/dL   RDW 12.9 11.5 - 15.5 %   Platelets 167 150 - 400 K/uL   nRBC 0.0 0.0 - 0.2 %   Neutrophils Relative % 56 %   Neutro Abs 3.8 1.7 - 7.7 K/uL   Lymphocytes Relative 33 %   Lymphs Abs 2.2 0.7 - 4.0 K/uL   Monocytes Relative 8 %   Monocytes Absolute 0.5 0 - 1 K/uL   Eosinophils Relative 1 %   Eosinophils Absolute 0.1 0 - 0 K/uL   Basophils Relative 1 %   Basophils Absolute 0.1 0 - 0 K/uL   Immature Granulocytes 1 %   Abs Immature Granulocytes 0.04 0.00 - 0.07 K/uL      Assessment & Plan:   Problem List Items Addressed This Visit      Endocrine   Type 2 diabetes mellitus with stage 1 chronic kidney disease, without long-term current use of insulin (HCC) - Primary    Running high at 8.8. Will continue trulicity and metformin and add jardiance. Recheck 3 months. Call with any concerns.       Relevant Medications   empagliflozin (JARDIANCE) 25 MG TABS tablet   Dulaglutide (TRULICITY) 1.5 MB/8.4YK SOPN (Start on 12/01/2019)   Other Relevant Orders   Bayer DCA Hb A1c Waived   Comprehensive metabolic panel   Hyperlipidemia associated with type 2 diabetes mellitus ([place])    Under good control on current regimen.  Continue  current regimen. Continue to monitor. Call with any concerns. Refills up to date. Labs drawn today.        Relevant Medications   empagliflozin (JARDIANCE) 25 MG TABS tablet   Dulaglutide (TRULICITY) 1.5 SV/7.7LT SOPN (Start on 12/01/2019)   Other Relevant Orders   Comprehensive metabolic panel   Lipid Panel w/o Chol/HDL Ratio     Genitourinary   Benign hypertensive renal disease    Under good control on current regimen. Continue current regimen. Continue to monitor. Call with any concerns. Refills up to date. Labs drawn today.        Relevant Orders   Comprehensive metabolic panel       Follow up plan: Return in about 3 months (around 02/25/2020).

## 2019-11-26 NOTE — Assessment & Plan Note (Signed)
Running high at 8.8. Will continue trulicity and metformin and add jardiance. Recheck 3 months. Call with any concerns.

## 2019-11-26 NOTE — Chronic Care Management (AMB) (Signed)
Chronic Care Management Pharmacy  Name: Kristopher [name]  MRN: [id number] DOB: 25-Sep-1960   Chief Complaint/ HPI  Kristopher [name],  59 y.o. , male presents for their Follow-Up CCM visit with the clinical pharmacist via telephone due to COVID-19 Pandemic.  PCP : [name], DO Patient Care Team: [name], DO as PCP - General (Family Medicine) Gavin Pound, CMA (Inactive) as Certified Medical Assistant Telford Nab, RN as Registered Nurse Vanita Ingles, RN as Case Manager (General Practice) [name], MD as Referring Physician (Pain Medicine) [name], MD as Consulting Physician (Oncology)  Their chronic conditions include: Hypertension, Hyperlipidemia, Diabetes, Tobacco use (recurrent lung cancer)  and chronic pain     Allergies  Allergen Reactions  . Acetaminophen Swelling  . Aspirin Anaphylaxis  . Epinephrine Anaphylaxis    Does not include albuterol  . Novocain [Procaine] Anaphylaxis  . Penicillins Anaphylaxis    Has patient had a PCN reaction causing immediate rash, facial/tongue/throat swelling, SOB or lightheadedness with hypotension: Yes Has patient had a PCN reaction causing severe rash involving mucus membranes or skin necrosis: No Has patient had a PCN reaction that required hospitalization: Yes Has patient had a PCN reaction occurring within the last 10 years: No If all of the above answers are "NO", then may proceed with Cephalosporin use.   . Strawberry Extract Anaphylaxis  . Shellfish Allergy Hives and Nausea And Vomiting    Medications: Outpatient Encounter Medications as of 11/26/2019  Medication Sig Note  . Accu-Chek FastClix Lancets MISC USE TO TEST BLOOD SUGAR 2X A DAY   . ACCU-CHEK GUIDE test strip USE TO TEST BLOOD SUGAR 2X A DAY   . amLODipine (NORVASC) 5 MG tablet Take 1 tablet (5 mg total) by mouth 2 (two) times daily.   [name] atorvastatin (LIPITOR) 10 MG tablet Take 1 tablet (10 mg total) by mouth daily  at 6 PM.   . BREZTRI AEROSPHERE 160-9-4.8 MCG/ACT AERO Inhale 2 puffs into the lungs 2 (two) times daily. Maintenance per pulmonology   . Dulaglutide (TRULICITY) 1.5 RX/5.4MG SOPN Inject 0.5 mLs (1.5 mg total) into the skin once a week. (Patient taking differently: Inject 1.5 mg into the skin every Wednesday. )   . esomeprazole (NEXIUM) 40 MG capsule Take 1 capsule (40 mg total) by mouth daily.   . hydrALAZINE (APRESOLINE) 100 MG tablet Take 1 tablet (100 mg total) by mouth 2 (two) times daily.   [name] lidocaine (LIDODERM) 5 % Place 1 patch onto the skin daily. Remove & Discard patch within 12 hours or as directed by MD (Patient taking differently: Place 1 patch onto the skin daily as needed (pain). Remove & Discard patch within 12 hours or as directed by MD)   . lidocaine-prilocaine (EMLA) cream Apply to affected area once (Patient taking differently: Apply 1 application topically daily as needed (port access). ) 11/09/2019: Hasnt started  . metFORMIN (GLUCOPHAGE) 500 MG tablet Take 2 tablets (1,000 mg total) by mouth 2 (two) times daily with a meal.   . metoprolol tartrate (LOPRESSOR) 50 MG tablet Take 50 mg by mouth 2 (two) times daily.   . montelukast (SINGULAIR) 10 MG tablet Take 10 mg by mouth daily.   [name] morphine (MSIR) 15 MG tablet Take 1 tablet (15 mg total) by mouth every 6 (six) hours as needed for moderate pain or severe pain. Must last 30 days.   . nortriptyline (PAMELOR) 25 MG capsule TAKE 2 CAPSULES (50 MG TOTAL) BY MOUTH  AT BEDTIME.   [name] ondansetron (ZOFRAN) 8 MG tablet Take 1 tablet (8 mg total) by mouth 2 (two) times daily as needed for refractory nausea / vomiting.   . pantoprazole (PROTONIX) 40 MG tablet Take 1 tablet (40 mg total) by mouth daily.   . prochlorperazine (COMPAZINE) 10 MG tablet Take 1 tablet (10 mg total) by mouth every 6 (six) hours as needed (Nausea or vomiting).   . SUMAtriptan (IMITREX) 50 MG tablet TAKE 1 TAB AT ONSET OF MIGRAINE. MAY REPEAT IN 2 HOURS IF HEADACHE  PERSISTS OR RECURS. (Patient taking differently: Take 50 mg by mouth every 2 (two) hours as needed for migraine. Take 1 tab at onset of migraine. May repeat in 2 hours if headache persists or recurs.)   . ARNUITY ELLIPTA 100 MCG/ACT AEPB Inhale 1 puff into the lungs daily. (Patient not taking: Reported on 11/26/2019)   . diphenhydrAMINE (BENADRYL) 25 MG tablet Take 50 mg by mouth daily.   Derrill Memo ON 12/26/2019] morphine (MSIR) 15 MG tablet Take 1 tablet (15 mg total) by mouth every 6 (six) hours as needed for moderate pain or severe pain. Must last 30 days.   Derrill Memo ON 01/25/2020] morphine (MSIR) 15 MG tablet Take 1 tablet (15 mg total) by mouth every 6 (six) hours as needed for moderate pain or severe pain. Must last 30 days.   . Multiple Vitamin (MULTIVITAMIN WITH MINERALS) TABS tablet Take 1 tablet by mouth daily.   Derrill Memo ON 01/11/2020] NONFORMULARY OR COMPOUNDED ITEM Apply 1-2 mLs topically 4 (four) times daily as needed. 10% Ketamine/2% Cyclobenzaprine/6% Gabapentin Cream   . oxyCODONE (OXY IR/ROXICODONE) 5 MG immediate release tablet Take 1 tablet (5 mg total) by mouth every 4 (four) hours as needed for moderate pain or severe pain. (Patient not taking: Reported on 11/26/2019)   . Potassium Chloride ER 20 MEQ TBCR TAKE 1 TABLET BY MOUTH EVERY DAY (Patient taking differently: Take 20 mEq by mouth daily. )   . STIOLTO RESPIMAT 2.5-2.5 MCG/ACT AERS Inhale 2 puffs into the lungs daily. Prn per pulmonology notes    Facility-Administered Encounter Medications as of 11/26/2019  Medication  . 0.9 %  sodium chloride infusion    Wt Readings from Last 3 Encounters:  11/24/19 231 lb 6.4 oz (105 kg)  11/11/19 231 lb 11.3 oz (105.1 kg)  11/05/19 231 lb 12.8 oz (105.1 kg)    Current Diagnosis/Assessment:    Goals Addressed              This Visit's Progress   .  PharmD "I don't like taking medications" (pt-stated)         CARE PLAN ENTRY (see longtitudinal plan of care for additional  care plan information)  Current Barriers:  . Financial, social, and community barriers:  o Bronchoscopy 10/15/19 revealed recurrence of squamous cell lung cancer now stage IIIa with mets to lymph nodes. Mr. Colpitts is s/p four radiation therapy session and one session of chemo. He states there are 41 weekly chemo sessions planned. He has chemo ( carboplatin and paclitaxel) on Wednesday's and XRT M, Tu,Th, Fri. Is experiencing decreased appetite and would like to try CBD gummies but has been able to get in touch with pain management for approval. . Polypharmacy; complex patient with multiple comorbidities including recurrent lung cancer, tobacco abuse, HTN, migraines o T2DM: uncontrolled, last A1c 8.4%. Metformin 6378 mg BID, Trulicity 1.5 mg weekly  sugars improved, fastings generally 120-150s. He is checking his BG regularly with  FBG yesterday of 150. He has not yet checked today.  o HTN: amlodipine 5 mg BID, hydralazine 100 mg BID, metoprolol tartrate 50mg  BID. Home BP and office SBP at goal <140.  o ASCVD risk reduction: atorvastatin 10 mg daily; last LDL  702/2021 7, goal < 70 given  DM and ASCVD risk factors  o Chronic pain: nortriptyline 50 mg QPM; morphine IR 15 mg, Q4-6H PRN (taking 3-4 doses daily); unable to get oxy ir so changed to MS IR --reports good relief  o Tobacco abuse: currently smoking ~ 3-5 cigarettes per day. Notes that he smokes after meals but has not been finishing entire cigarette. He has NRT patches and gum. High desire to quit smoking.We discussing [place]uit[place] resources.  o COPD stage 2B followed by Pulmonolgy: Brextri 2 puffs bid, Stiolto 2 puffs daily as needed. Follow up with pulm October. Denies shortness of breath o Dentures-  received new set yesterday.   Pharmacist Clinical Goal(s):  [name] Over the next 90 days, patient will work with PharmD and provider towards optimized medication management  Interventions: . Comprehensive medication review performed, medication list  updated in electronic medical record . Inter-disciplinary care team collaboration (see longitudinal plan of care) . Praised for progress towards goal A1c. We discussed diet extensively. He reports drinking 3 6-packs of Mt. Dew daily which is down from 14 6-packs previously. We discussed the effects of sugary drinks on BG. He understands and is cutting back--gets headaches if he goes without. Encouraged him to incorporate water by drinking half a glass of water with every Mt. Dew. He states water gives him diarrhea but is willing to try. . Praised for attainment of  goal BP.   [name] Motivational interviewing regarding tobacco cessation. Praised patient for continued progress. Discussed NCQitline resources and the benefit of a quit  coach.Reviewed short term and long term benefits of tobacco cessation. He will ask his wife to help him register on the website .   Patient Self Care Activities:  . Patient will take medications as prescribed  Please see past updates related to this goal by clicking on the "Past Updates" button in the selected goal         COPD / Asthma / Tobacco abuse with recurrent lung cancer    Gold Grade: Gold 1 (FEV1>80%) Current COPD Classification:  2B  Eosinophil count:   Lab Results  Component Value Date/Time   EOSPCT 1 11/24/2019 08:20 AM  %                               Eos (Absolute):  Lab Results  Component Value Date/Time   EOSABS 0.1 11/24/2019 08:20 AM   EOSABS 0.1 04/27/2019 08:08 AM    Tobacco Status:  Social History   Tobacco Use  Smoking Status Former Smoker  . Packs/day: 0.05  . Years: 35.00  . Pack years: 1.75  . Types: Cigarettes  . Quit date: 01/14/2019  . Years since quitting: 0.8  Smokeless Tobacco Never Used  Tobacco Comment   1 pack last 3 days     Patient has failed these meds in past: Anoro Patient is currently controlled on the following medications:  Breztri 2 puffs bid Stiolto 2 puffs daily    (both meds per Pulmonology at  10/22/19 appt) Using maintenance inhaler regularly? Yes Frequency of rescue inhaler use:  infrequently  We discussed:  Pulmonology notes state Stiolto used prn in addition [employer].  [name] knows he takes 2 puffs 2 times daily of an inhaler and has another for as needed use. He does not remember the names of the inhalers. His wife helps manage his medications. He denies any shortness of breath.Follow up with Pulm in October.  Plan  Continue current medications  and  Hypertension   BP goal is:  <130/80  Office blood pressures are  BP Readings from Last 3 Encounters:  11/24/19 124/89  11/24/19 128/84  11/11/19 102/76     Patient checks BP at home 3-5x per week  Patient home BP readings are ranging: 128/84 yesterday. Hasn't checked today and states readings have been 120s/80s  We discussed: Patient is watching his diet and spouse prepares most meals at home. His BP has been normal every day this week when checked prior to chemo/radiation.  Plan  Continue current medications       Medication Management   Pt uses [facility] for all medications Uses pill box? Yes wife fills weekly   Plan  Continue current medication management strategy    Follow up: 3  month phone visit  Junita Push. Kenton Kingfisher PharmD, [facility] [phone]

## 2019-11-26 NOTE — Patient Instructions (Addendum)
Visit Information  It was a pleasure speaking with you today. Thank you for letting me be part of your clinical team. Please call with any questions or concerns.   Goals Addressed              This Visit's Progress   .  PharmD "I don't like taking medications" (pt-stated)         CARE PLAN ENTRY (see longtitudinal plan of care for additional care plan information)  Current Barriers:  . Financial, social, and community barriers:  o Bronchoscopy 10/15/19 revealed recurrence of squamous cell lung cancer now stage IIIa with mets to lymph nodes. Kristopher [name] is s/p four radiation therapy session and one session of chemo. He states there are 41 weekly chemo sessions planned. He has chemo ( carboplatin and paclitaxel) on Wednesday's and XRT M, Tu,Th, Fri. Is experiencing decreased appetite and would like to try CBD gummies but has been able to get in touch with pain management for approval. . Polypharmacy; complex patient with multiple comorbidities including recurrent lung cancer, tobacco abuse, HTN, migraines o T2DM: uncontrolled, last A1c 8.4%. Metformin 2297 mg BID, Trulicity 1.5 mg weekly  sugars improved, fastings generally 120-150s. He is checking his BG regularly with FBG yesterday of 150. He has not yet checked today.  o HTN: amlodipine 5 mg BID, hydralazine 100 mg BID, metoprolol tartrate 50mg  BID. Home BP and office SBP at goal <140.  o ASCVD risk reduction: atorvastatin 10 mg daily; last LDL  702/2021 7, goal < 70 given  DM and ASCVD risk factors  o Chronic pain: nortriptyline 50 mg QPM; morphine IR 15 mg, Q4-6H PRN (taking 3-4 doses daily); unable to get oxy ir so changed to MS IR --reports good relief  o Tobacco abuse: currently smoking ~ 3-5 cigarettes per day. Notes that he smokes after meals but has not been finishing entire cigarette. He has NRT patches and gum. High desire to quit smoking.We discussing [place] resources.  o COPD stage 2B followed by Pulmonolgy: Brextri 2 puffs  bid, Stiolto 2 puffs daily as needed. Follow up with pulm October. Denies shortness of breath o Dentures-  received new set yesterday.   Pharmacist Clinical Goal(s):  [name] Over the next 90 days, patient will work with PharmD and provider towards optimized medication management  Interventions: . Comprehensive medication review performed, medication list updated in electronic medical record . Inter-disciplinary care team collaboration (see longitudinal plan of care) . Praised for progress towards goal A1c. We discussed diet extensively. He reports drinking 3 6-packs of Mt. Dew daily which is down from 14 6-packs previously. We discussed the effects of sugary drinks on BG. He understands and is cutting back--gets headaches if he goes without. Encouraged him to incorporate water by drinking half a glass of water with every Mt. Dew. He states water gives him diarrhea but is willing to try. . Praised for attainment of  goal BP.   [name] Motivational interviewing regarding tobacco cessation. Praised patient for continued progress. Discussed [place]line resources and the benefit of a quit  coach.Reviewed short term and long term benefits of tobacco cessation. He will ask his wife to help him register on the website .   Patient Self Care Activities:  . Patient will take medications as prescribed  Please see past updates related to this goal by clicking on the "Past Updates" button in the selected goal         The patient verbalized understanding of instructions provided today and declined  a print copy of patient instruction materials.   Telephone follow up appointment with pharmacy team member scheduled for: 3 months  Junita Push. Makenzi Bannister PharmD, BCPS Clinical Pharmacist [phone]  Diabetes Mellitus and Nutrition, Adult When you have diabetes (diabetes mellitus), it is very important to have healthy eating habits because your blood sugar (glucose) levels are greatly affected by what you eat and drink.  Eating healthy foods in the appropriate amounts, at about the same times every day, can help you:  Control your blood glucose.  Lower your risk of heart disease.  Improve your blood pressure.  Reach or maintain a healthy weight. Every person with diabetes is different, and each person has different needs for a meal plan. Your health care provider may recommend that you work with a diet and nutrition specialist (dietitian) to make a meal plan that is best for you. Your meal plan may vary depending on factors such as:  The calories you need.  The medicines you take.  Your weight.  Your blood glucose, blood pressure, and cholesterol levels.  Your activity level.  Other health conditions you have, such as heart or kidney disease. How do carbohydrates affect me? Carbohydrates, also called carbs, affect your blood glucose level more than any other type of food. Eating carbs naturally raises the amount of glucose in your blood. Carb counting is a method for keeping track of how many carbs you eat. Counting carbs is important to keep your blood glucose at a healthy level, especially if you use insulin or take certain oral diabetes medicines. It is important to know how many carbs you can safely have in each meal. This is different for every person. Your dietitian can help you calculate how many carbs you should have at each meal and for each snack. Foods that contain carbs include:  Bread, cereal, rice, pasta, and crackers.  Potatoes and corn.  Peas, beans, and lentils.  Milk and yogurt.  Fruit and juice.  Desserts, such as cakes, cookies, ice cream, and candy. How does alcohol affect me? Alcohol can cause a sudden decrease in blood glucose (hypoglycemia), especially if you use insulin or take certain oral diabetes medicines. Hypoglycemia can be a life-threatening condition. Symptoms of hypoglycemia (sleepiness, dizziness, and confusion) are similar to symptoms of having too much  alcohol. If your health care provider says that alcohol is safe for you, follow these guidelines:  Limit alcohol intake to no more than 1 drink per day for nonpregnant women and 2 drinks per day for men. One drink equals 12 oz of beer, 5 oz of wine, or 1 oz of hard liquor.  Do not drink on an empty stomach.  Keep yourself hydrated with water, diet soda, or unsweetened iced tea.  Keep in mind that regular soda, juice, and other mixers may contain a lot of sugar and must be counted as carbs. What are tips for following this plan?  Reading food labels  Start by checking the serving size on the "Nutrition Facts" label of packaged foods and drinks. The amount of calories, carbs, fats, and other nutrients listed on the label is based on one serving of the item. Many items contain more than one serving per package.  Check the total grams (g) of carbs in one serving. You can calculate the number of servings of carbs in one serving by dividing the total carbs by 15. For example, if a food has 30 g of total carbs, it would be equal to 2 servings of  carbs.  Check the number of grams (g) of saturated and trans fats in one serving. Choose foods that have low or no amount of these fats.  Check the number of milligrams (mg) of salt (sodium) in one serving. Most people should limit total sodium intake to less than 2,300 mg per day.  Always check the nutrition information of foods labeled as "low-fat" or "nonfat". These foods may be higher in added sugar or refined carbs and should be avoided.  Talk to your dietitian to identify your daily goals for nutrients listed on the label. Shopping  Avoid buying canned, premade, or processed foods. These foods tend to be high in fat, sodium, and added sugar.  Shop around the outside edge of the grocery store. This includes fresh fruits and vegetables, bulk grains, fresh meats, and fresh dairy. Cooking  Use low-heat cooking methods, such as baking, instead of  high-heat cooking methods like deep frying.  Cook using healthy oils, such as olive, canola, or sunflower oil.  Avoid cooking with butter, cream, or high-fat meats. Meal planning  Eat meals and snacks regularly, preferably at the same times every day. Avoid going long periods of time without eating.  Eat foods high in fiber, such as fresh fruits, vegetables, beans, and whole grains. Talk to your dietitian about how many servings of carbs you can eat at each meal.  Eat 4-6 ounces (oz) of lean protein each day, such as lean meat, chicken, fish, eggs, or tofu. One oz of lean protein is equal to: ? 1 oz of meat, chicken, or fish. ? 1 egg. ?  cup of tofu.  Eat some foods each day that contain healthy fats, such as avocado, nuts, seeds, and fish. Lifestyle  Check your blood glucose regularly.  Exercise regularly as told by your health care provider. This may include: ? 150 minutes of moderate-intensity or vigorous-intensity exercise each week. This could be brisk walking, biking, or water aerobics. ? Stretching and doing strength exercises, such as yoga or weightlifting, at least 2 times a week.  Take medicines as told by your health care provider.  Do not use any products that contain nicotine or tobacco, such as cigarettes and e-cigarettes. If you need help quitting, ask your health care provider.  Work with a Social worker or diabetes educator to identify strategies to manage stress and any emotional and social challenges. Questions to ask a health care provider  Do I need to meet with a diabetes educator?  Do I need to meet with a dietitian?  What number can I call if I have questions?  When are the best times to check my blood glucose? Where to find more information:  American Diabetes Association: diabetes.org  Academy of Nutrition and Dietetics: www.eatright.[employer] of Diabetes and Digestive and Kidney Diseases (NIH): [employer] Summary  A healthy  meal plan will help you control your blood glucose and maintain a healthy lifestyle.  Working with a diet and nutrition specialist (dietitian) can help you make a meal plan that is best for you.  Keep in mind that carbohydrates (carbs) and alcohol have immediate effects on your blood glucose levels. It is important to count carbs and to use alcohol carefully. This information is not intended to replace advice given to you by your health care provider. Make sure you discuss any questions you have with your health care provider. Document Revised: 01/31/2017 Document Reviewed: 03/25/2016 Elsevier Patient Education  2020 [employer].

## 2019-11-26 NOTE — Progress Notes (Signed)
[place]  [phone] [phone]  ID: Kristopher [name] OB: 1960/10/22  MR#: [id number]  BHA#:[id number]  Patient Care Team: [name], DO as PCP - General (Family Medicine) Gavin Pound, CMA (Inactive) as Certified Medical Assistant Telford Nab, RN as Registered Nurse Vanita Ingles, RN as Case Manager (General Practice) [name], MD as Referring Physician (Pain Medicine) [name], MD as Consulting Physician (Oncology)  CHIEF COMPLAINT: Clinical stage IIIa squamous cell 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  INTERVAL HISTORY: Patient returns to clinic today for further evaluation and consideration of cycle 2 of weekly carboplatin and Taxol.  He tolerated his first treatment well without significant side effects.  He is tolerating a daily XRT.  He currently feels well and is asymptomatic. He denies any weakness or fatigue. He has a good appetite and denies weight loss. He has no neurologic complaints.  He denies any recent fevers or illnesses.  He has no chest pain, shortness of breath, cough, or hemoptysis.  He denies any nausea, vomiting, constipation, or diarrhea.  He has no urinary complaints.  Patient offers no specific complaints today.  REVIEW OF SYSTEMS:   Review of Systems  Constitutional: Negative.  Negative for fever, malaise/fatigue and weight loss.  Respiratory: Negative.  Negative for cough and shortness of breath.   Cardiovascular: Negative.  Negative for chest pain and leg swelling.  Gastrointestinal: Negative.  Negative for abdominal pain.  Genitourinary: Negative.  Negative for dysuria and flank pain.  Musculoskeletal: Negative.  Negative for back pain.  Skin: Negative.  Negative for rash.  Neurological: Negative.  Negative for dizziness, focal weakness, weakness and headaches.  Psychiatric/Behavioral: Negative.  The patient is not nervous/anxious.     As per HPI. Otherwise, a complete  review of systems is negative.  PAST MEDICAL HISTORY: Past Medical History:  Diagnosis Date  . Allergy   . Arthritis    left foot  . Benign hypertensive kidney disease   . Chronic back pain    Four rods in back  . Diabetes mellitus, type 2 ([place])   . Dyspnea   . GERD (gastroesophageal reflux disease)   . Hypertension   . Malignant neoplasm of lung ([place])   . Migraines    daily    PAST SURGICAL HISTORY: Past Surgical History:  Procedure Laterality Date  . APPENDECTOMY    . BACK SURGERY    . COLONOSCOPY WITH PROPOFOL N/A 02/19/2016   Procedure: COLONOSCOPY WITH PROPOFOL;  Surgeon: Lucilla Lame, MD;  Location: [place];  Service: Endoscopy;  Laterality: N/A;  . COLONOSCOPY WITH PROPOFOL N/A 01/19/2018   Procedure: COLONOSCOPY WITH PROPOFOL;  Surgeon: Lucilla Lame, MD;  Location: [place];  Service: Endoscopy;  Laterality: N/A;  Diabetic - oral meds  . DG OPERATIVE LEFT HIP ([place] HX)     10/19  . ELECTROMAGNETIC NAVIGATION BROCHOSCOPY Left 11/18/2018   Procedure: ELECTROMAGNETIC NAVIGATION BRONCHOSCOPY;  Surgeon: Tyler Pita, MD;  Location: [facility];  Service: Cardiopulmonary;  Laterality: Left;  . FLEXIBLE BRONCHOSCOPY Bilateral 01/20/2019   Procedure: FLEXIBLE BRONCHOSCOPY;  Surgeon: Ottie Glazier, MD;  Location: [facility];  Service: Thoracic;  Laterality: Bilateral;  . FLEXIBLE BRONCHOSCOPY Bilateral 01/22/2019   Procedure: FLEXIBLE BRONCHOSCOPY;  Surgeon: Ottie Glazier, MD;  Location: [facility];  Service: Thoracic;  Laterality: Bilateral;  . FOOT SURGERY Left    Screws and plates  . JOINT REPLACEMENT Left 12/2017   DR Rudene Christians Hip  . KNEE SURGERY Left  X 2  . LEG SURGERY    . LUNG CANCER SURGERY    . POLYPECTOMY N/A 02/19/2016   Procedure: POLYPECTOMY;  Surgeon: Lucilla Lame, MD;  Location: [place];  Service: Endoscopy;  Laterality: N/A;  . POLYPECTOMY  01/19/2018   Procedure: POLYPECTOMY;  Surgeon: Lucilla Lame, MD;  Location:  [place];  Service: Endoscopy;;  . PORTACATH PLACEMENT N/A 11/11/2019   Procedure: INSERTION PORT-A-CATH;  Surgeon: Nestor Lewandowsky, MD;  Location: [facility];  Service: General;  Laterality: N/A;  . THORACOTOMY Left 01/14/2019   Procedure: THORACOTOMY MAJOR, LEFT;  Surgeon: Nestor Lewandowsky, MD;  Location: [facility];  Service: General;  Laterality: Left;  . TOTAL HIP ARTHROPLASTY Left 12/02/2017   Procedure: TOTAL HIP ARTHROPLASTY ANTERIOR APPROACH;  Surgeon: Hessie Knows, MD;  Location: [facility];  Service: Orthopedics;  Laterality: Left;  [name] VIDEO BRONCHOSCOPY Left 01/14/2019   Procedure: VIDEO BRONCHOSCOPY WITH FLUORO, LEFT;  Surgeon: Nestor Lewandowsky, MD;  Location: [facility];  Service: General;  Laterality: Left;  [name] VIDEO BRONCHOSCOPY WITH ENDOBRONCHIAL NAVIGATION N/A 10/15/2019   Procedure: VIDEO BRONCHOSCOPY WITH ENDOBRONCHIAL NAVIGATION;  Surgeon: Ottie Glazier, MD;  Location: [facility];  Service: Thoracic;  Laterality: N/A;  . VIDEO BRONCHOSCOPY WITH ENDOBRONCHIAL ULTRASOUND N/A 10/15/2019   Procedure: VIDEO BRONCHOSCOPY WITH ENDOBRONCHIAL ULTRASOUND;  Surgeon: Ottie Glazier, MD;  Location: [facility];  Service: Thoracic;  Laterality: N/A;    FAMILY HISTORY: Family History  Problem Relation Age of Onset  . Cancer Father   . Diabetes Sister   . Thrombosis Sister     ADVANCED DIRECTIVES (Y/N):  N  HEALTH MAINTENANCE: Social History   Tobacco Use  . Smoking status: Current Every Day Smoker    Packs/day: 0.05    Years: 35.00    Pack years: 1.75    Types: Cigarettes    Last attempt to quit: 01/14/2019    Years since quitting: 0.8  . Smokeless tobacco: Never Used  . Tobacco comment: 1 pack last 3 days   Vaping Use  . Vaping Use: Never used  Substance Use Topics  . Alcohol use: No    Alcohol/week: 0.0 standard drinks  . Drug use: Yes    Types: Oxycodone    Comment: prescribed     Colonoscopy:  PAP:  Bone density:  Lipid panel:  Allergies  Allergen Reactions  .  Acetaminophen Swelling  . Aspirin Anaphylaxis  . Epinephrine Anaphylaxis    Does not include albuterol  . Novocain [Procaine] Anaphylaxis  . Penicillins Anaphylaxis    Has patient had a PCN reaction causing immediate rash, facial/tongue/throat swelling, SOB or lightheadedness with hypotension: Yes Has patient had a PCN reaction causing severe rash involving mucus membranes or skin necrosis: No Has patient had a PCN reaction that required hospitalization: Yes Has patient had a PCN reaction occurring within the last 10 years: No If all of the above answers are "NO", then may proceed with Cephalosporin use.   . Strawberry Extract Anaphylaxis  . Shellfish Allergy Hives and Nausea And Vomiting    Current Outpatient Medications  Medication Sig Dispense Refill  . Accu-Chek FastClix Lancets MISC USE TO TEST BLOOD SUGAR 2X A DAY 100 each 12  . ACCU-CHEK GUIDE test strip USE TO TEST BLOOD SUGAR 2X A DAY 100 strip 12  . amLODipine (NORVASC) 5 MG tablet Take 1 tablet (5 mg total) by mouth 2 (two) times daily. 180 tablet 1  . ARNUITY ELLIPTA 100 MCG/ACT AEPB Inhale 1 puff into the lungs daily.     [name]  atorvastatin (LIPITOR) 10 MG tablet Take 1 tablet (10 mg total) by mouth daily at 6 PM. 90 tablet 1  . BREZTRI AEROSPHERE 160-9-4.8 MCG/ACT AERO Inhale 2 puffs into the lungs 2 (two) times daily. Maintenance per pulmonology    . diphenhydrAMINE (BENADRYL) 25 MG tablet Take 50 mg by mouth daily.    . Dulaglutide (TRULICITY) 1.5 ON/6.2XB SOPN Inject 1.5 mg into the skin every Wednesday. 6 mL 1  . empagliflozin (JARDIANCE) 25 MG TABS tablet Take 1 tablet (25 mg total) by mouth daily before breakfast. 30 tablet 3  . esomeprazole (NEXIUM) 40 MG capsule Take 1 capsule (40 mg total) by mouth daily. 90 capsule 3  . hydrALAZINE (APRESOLINE) 100 MG tablet Take 1 tablet (100 mg total) by mouth 2 (two) times daily. 180 tablet 1  . lidocaine (LIDODERM) 5 % Place 1 patch onto the skin daily. Remove & Discard patch  within 12 hours or as directed by MD (Patient taking differently: Place 1 patch onto the skin daily as needed (pain). Remove & Discard patch within 12 hours or as directed by MD) 30 patch 12  . lidocaine-prilocaine (EMLA) cream Apply to affected area once (Patient taking differently: Apply 1 application topically daily as needed (port access). ) 30 g 3  . metFORMIN (GLUCOPHAGE) 500 MG tablet Take 2 tablets (1,000 mg total) by mouth 2 (two) times daily with a meal. 360 tablet 1  . metoprolol tartrate (LOPRESSOR) 50 MG tablet Take 50 mg by mouth 2 (two) times daily.    . montelukast (SINGULAIR) 10 MG tablet Take 10 mg by mouth daily.    [name] morphine (MSIR) 15 MG tablet Take 1 tablet (15 mg total) by mouth every 6 (six) hours as needed for moderate pain or severe pain. Must last 30 days. 120 tablet 0  . [START ON 12/26/2019] morphine (MSIR) 15 MG tablet Take 1 tablet (15 mg total) by mouth every 6 (six) hours as needed for moderate pain or severe pain. Must last 30 days. 120 tablet 0  . [START ON 01/25/2020] morphine (MSIR) 15 MG tablet Take 1 tablet (15 mg total) by mouth every 6 (six) hours as needed for moderate pain or severe pain. Must last 30 days. 120 tablet 0  . Multiple Vitamin (MULTIVITAMIN WITH MINERALS) TABS tablet Take 1 tablet by mouth daily.    Derrill Memo ON 01/11/2020] NONFORMULARY OR COMPOUNDED ITEM Apply 1-2 mLs topically 4 (four) times daily as needed. 10% Ketamine/2% Cyclobenzaprine/6% Gabapentin Cream 240 each 12  . nortriptyline (PAMELOR) 25 MG capsule TAKE 2 CAPSULES (50 MG TOTAL) BY MOUTH AT BEDTIME. 180 capsule 1  . ondansetron (ZOFRAN) 8 MG tablet Take 1 tablet (8 mg total) by mouth 2 (two) times daily as needed for refractory nausea / vomiting. 60 tablet 1  . oxyCODONE (OXY IR/ROXICODONE) 5 MG immediate release tablet Take 1 tablet (5 mg total) by mouth every 4 (four) hours as needed for moderate pain or severe pain. 20 tablet 0  . pantoprazole (PROTONIX) 40 MG tablet Take 1 tablet  (40 mg total) by mouth daily. 15 tablet 2  . Potassium Chloride ER 20 MEQ TBCR TAKE 1 TABLET BY MOUTH EVERY DAY (Patient taking differently: Take 20 mEq by mouth daily. ) 90 tablet 1  . prochlorperazine (COMPAZINE) 10 MG tablet Take 1 tablet (10 mg total) by mouth every 6 (six) hours as needed (Nausea or vomiting). 60 tablet 2  . STIOLTO RESPIMAT 2.5-2.5 MCG/ACT AERS Inhale 2 puffs into the lungs daily.  Prn per pulmonology notes    . SUMAtriptan (IMITREX) 50 MG tablet Take 1 tablet (50 mg total) by mouth every 2 (two) hours as needed for migraine. Take 1 tab at onset of migraine. May repeat in 2 hours if headache persists or recurs. 10 tablet 12   No current facility-administered medications for this visit.   Facility-Administered Medications Ordered in Other Visits  Medication Dose Route Frequency Provider Last Rate Last Admin  . 0.9 %  sodium chloride infusion    Continuous PRN Dionne Bucy, CRNA   New Bag at 01/22/19 [phone]  . heparin lock flush 100 unit/mL  500 Units Intracatheter Once PRN [name], MD      . sodium chloride flush (NS) 0.9 % injection 10 mL  10 mL Intravenous PRN [name], MD   10 mL at 12/01/19 0824    OBJECTIVE: Vitals:   12/01/19 0849  BP: 133/90  Pulse: 88  Resp: (!) 99  Temp: 98.2 F (36.8 C)     Body mass index is 32.79 kg/m.    ECOG FS:0 - Asymptomatic  General: Well-developed, well-nourished, no acute distress. Eyes: Pink conjunctiva, anicteric sclera. HEENT: Normocephalic, moist mucous membranes. Lungs: No audible wheezing or coughing. Heart: Regular rate and rhythm. Abdomen: Soft, nontender, no obvious distention. Musculoskeletal: No edema, cyanosis, or clubbing. Neuro: Alert, answering all questions appropriately. Cranial nerves grossly intact. Skin: No rashes or petechiae noted. Psych: Normal affect.   LAB RESULTS:  Lab Results  Component Value Date   NA 137 12/01/2019   K 3.3 (L) 12/01/2019   CL 102 12/01/2019   CO2  25 12/01/2019   GLUCOSE 195 (H) 12/01/2019   BUN 14 12/01/2019   CREATININE 0.73 12/01/2019   CALCIUM 8.8 (L) 12/01/2019   PROT 7.3 12/01/2019   ALBUMIN 4.2 12/01/2019   AST 79 (H) 12/01/2019   ALT 69 (H) 12/01/2019   ALKPHOS 103 12/01/2019   BILITOT 0.7 12/01/2019   GFRNONAA >60 12/01/2019   GFRAA >60 12/01/2019    Lab Results  Component Value Date   WBC 6.4 12/01/2019   NEUTROABS 4.1 12/01/2019   HGB 12.5 (L) 12/01/2019   HCT 36.5 (L) 12/01/2019   MCV 86.1 12/01/2019   PLT 190 12/01/2019     STUDIES: X-ray chest PA or AP  Result Date: 11/11/2019 CLINICAL DATA:  Port-A-Cath placement EXAM: CHEST  1 VIEW COMPARISON:  Chest radiograph November 10, 2019 study obtained earlier in the day FINDINGS: Port-A-Cath tip is in the superior vena cava. No pneumothorax. No edema or airspace opacity. Heart size within normal limits. Pulmonary vascularity normal. No adenopathy appreciable by radiography. No evident bone lesions. IMPRESSION: Port-A-Cath tip in superior vena cava. No pneumothorax. Otherwise no change compared to study obtained earlier in the day. Electronically Signed   By: Lowella Grip III M.D.   On: 11/11/2019 10:51   Chest 2 View  Result Date: 11/10/2019 CLINICAL DATA:  Lung carcinoma. Hypertension. Preoperative Port-A-Cath placement EXAM: CHEST - 2 VIEW COMPARISON:  Chest radiograph October 15, 2019; chest CT October 28, 2019 FINDINGS: No edema or airspace opacity. Heart size and pulmonary vascular normal. Adenopathy noted on recent CT examination is not appreciable by radiography. No evident bone lesions. IMPRESSION: No edema or airspace opacity. Heart size within normal limits. Adenopathy seen on recent CT not appreciable by radiography. Electronically Signed   By: Lowella Grip III M.D.   On: 11/10/2019 13:35   DG C-Arm 1-60 Min-No Report  Result Date: 11/11/2019 Fluoroscopy  was utilized by the requesting physician.  No radiographic interpretation.    ASSESSMENT:  Clinical stage IIIa squamous cell 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  PLAN:    1.  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  He had a complicated postoperative course. CT scan results from August 14, 2019 reviewed independently with a suspicious subcarinal lymph node measuring 1.4 cm.  Follow-up PET scan on August 23, 2019 revealed hypermetabolism in lymph node highly suspicious for recurrence.  Biopsy confirmed recurrence increasing patient's stage from IA up to IIIA. Will give weekly carboplatinum and Taxol concurrently with XRT for 6-8 cycles.  At the conclusion of his concurrent chemo/XRT, patient will benefit from 1 year of maintenance immunotherapy using durvalumab.  Proceed with cycle 2 of weekly carboplatinum and Taxol today.  Continue daily XRT.  Return to clinic in 1 week for further evaluation and consideration of cycle 3.   2.  Pain: Chronic and unrelated to his malignancy.  Continue monitoring and treatment per pain clinic. 3.  Nausea: Patient asked if it were okay to take CBD Gummies for nausea.  From an oncology standpoint this would be okay, but patient did state concern that it would show a positive urine test.  He was instructed to let pain clinic know that he was taking this for oncologic purposes. 4.  Elevated LFTs: Mild, monitor.  Proceed with treatment as above.   Patient expressed understanding and was in agreement with this plan. He also understands that He can call clinic at any time with any questions, concerns, or complaints.   Cancer Staging Squamous cell carcinoma of left lung [facility]) Staging form: Lung, AJCC 8th Edition - Clinical stage from 02/12/2019: Stage IIIA (cT1b, cN2, cM0) - Signed by [name], MD on 11/01/2019   [name], MD   12/01/2019 2:59 PM

## 2019-11-26 NOTE — Telephone Encounter (Signed)
Telephone call to patient for follow up after receiving first infusion.   Patient states infusion went great.  States eating good and drinking plenty of fluids.   Denies any nausea or vomiting.  Encouraged patient to call for any concerns or questions. 

## 2019-11-26 NOTE — Assessment & Plan Note (Signed)
Under good control on current regimen. Continue current regimen. Continue to monitor. Call with any concerns. Refills up to date. Labs drawn today.

## 2019-11-27 LAB — COMPREHENSIVE METABOLIC PANEL
ALT: 42 IU/L (ref 0–44)
AST: 40 IU/L (ref 0–40)
Albumin/Globulin Ratio: 1.7 (ref 1.2–2.2)
Albumin: 4.1 g/dL (ref 3.8–4.9)
Alkaline Phosphatase: 113 IU/L (ref 44–121)
BUN/Creatinine Ratio: 15 (ref 9–20)
BUN: 13 mg/dL (ref 6–24)
Bilirubin Total: 0.5 mg/dL (ref 0.0–1.2)
CO2: 24 mmol/L (ref 20–29)
Calcium: 8.9 mg/dL (ref 8.7–10.2)
Chloride: 101 mmol/L (ref 96–106)
Creatinine, Ser: 0.88 mg/dL (ref 0.76–1.27)
GFR calc Af Amer: 110 mL/min/{1.73_m2} (ref >59)
GFR calc non Af Amer: 95 mL/min/{1.73_m2} (ref >59)
Globulin, Total: 2.4 g/dL (ref 1.5–4.5)
Glucose: 221 mg/dL — ABNORMAL HIGH (ref 65–99)
Potassium: 3.3 mmol/L — ABNORMAL LOW (ref 3.5–5.2)
Sodium: 140 mmol/L (ref 134–144)
Total Protein: 6.5 g/dL (ref 6.0–8.5)

## 2019-11-27 LAB — LIPID PANEL W/O CHOL/HDL RATIO
Cholesterol, Total: 138 mg/dL (ref 100–199)
HDL: 28 mg/dL — ABNORMAL LOW (ref >39)
LDL Chol Calc (NIH): 67 mg/dL (ref 0–99)
Triglycerides: 263 mg/dL — ABNORMAL HIGH (ref 0–149)
VLDL Cholesterol Cal: 43 mg/dL — ABNORMAL HIGH (ref 5–40)

## 2019-11-27 LAB — BAYER DCA HB A1C WAIVED: HB A1C (BAYER DCA - WAIVED): 8.8 % — ABNORMAL HIGH (ref <7.0)

## 2019-11-29 ENCOUNTER — Ambulatory Visit
Admission: RE | Admit: 2019-11-29 | Discharge: 2019-11-29 | Disposition: A | Discharge status: Home / Self Care | Payer: Medicare Other | Source: Ambulatory Visit | Attending: Radiation Oncology | Admitting: Radiation Oncology

## 2019-11-29 DIAGNOSIS — Z510 Encounter for antineoplastic radiation therapy: Secondary | ICD-10-CM | POA: Diagnosis not present

## 2019-11-29 DIAGNOSIS — C3432 Malignant neoplasm of lower lobe, left bronchus or lung: Secondary | ICD-10-CM | POA: Diagnosis not present

## 2019-11-30 ENCOUNTER — Encounter: Payer: Self-pay | Admitting: Oncology

## 2019-11-30 ENCOUNTER — Ambulatory Visit
Admission: RE | Admit: 2019-11-30 | Discharge: 2019-11-30 | Disposition: A | Discharge status: Home / Self Care | Payer: Medicare Other | Source: Ambulatory Visit | Attending: Radiation Oncology | Admitting: Radiation Oncology

## 2019-11-30 DIAGNOSIS — C3432 Malignant neoplasm of lower lobe, left bronchus or lung: Secondary | ICD-10-CM | POA: Diagnosis not present

## 2019-11-30 DIAGNOSIS — Z510 Encounter for antineoplastic radiation therapy: Secondary | ICD-10-CM | POA: Diagnosis not present

## 2019-11-30 NOTE — Progress Notes (Signed)
Patient denies any concerns or questions at this time. States he feels good and denies pain.

## 2019-12-01 ENCOUNTER — Inpatient Hospital Stay: Payer: Medicare Other

## 2019-12-01 ENCOUNTER — Ambulatory Visit
Admission: RE | Admit: 2019-12-01 | Discharge: 2019-12-01 | Disposition: A | Discharge status: Home / Self Care | Payer: Medicare Other | Source: Ambulatory Visit | Attending: Radiation Oncology | Admitting: Radiation Oncology

## 2019-12-01 ENCOUNTER — Inpatient Hospital Stay (HOSPITAL_BASED_OUTPATIENT_CLINIC_OR_DEPARTMENT_OTHER): Payer: Medicare Other | Admitting: Oncology

## 2019-12-01 ENCOUNTER — Other Ambulatory Visit: Payer: Self-pay

## 2019-12-01 VITALS — Resp 18

## 2019-12-01 VITALS — BP 133/90 | HR 88 | Temp 98.2°F | Resp 99 | Wt 228.5 lb

## 2019-12-01 DIAGNOSIS — M549 Dorsalgia, unspecified: Secondary | ICD-10-CM | POA: Diagnosis not present

## 2019-12-01 DIAGNOSIS — E785 Hyperlipidemia, unspecified: Secondary | ICD-10-CM | POA: Diagnosis not present

## 2019-12-01 DIAGNOSIS — C3432 Malignant neoplasm of lower lobe, left bronchus or lung: Secondary | ICD-10-CM | POA: Diagnosis not present

## 2019-12-01 DIAGNOSIS — C3492 Malignant neoplasm of unspecified part of left bronchus or lung: Secondary | ICD-10-CM

## 2019-12-01 DIAGNOSIS — Z79899 Other long term (current) drug therapy: Secondary | ICD-10-CM | POA: Diagnosis not present

## 2019-12-01 DIAGNOSIS — G4733 Obstructive sleep apnea (adult) (pediatric): Secondary | ICD-10-CM | POA: Diagnosis not present

## 2019-12-01 DIAGNOSIS — E1122 Type 2 diabetes mellitus with diabetic chronic kidney disease: Secondary | ICD-10-CM | POA: Diagnosis not present

## 2019-12-01 DIAGNOSIS — Z510 Encounter for antineoplastic radiation therapy: Secondary | ICD-10-CM | POA: Diagnosis not present

## 2019-12-01 DIAGNOSIS — I129 Hypertensive chronic kidney disease with stage 1 through stage 4 chronic kidney disease, or unspecified chronic kidney disease: Secondary | ICD-10-CM | POA: Diagnosis not present

## 2019-12-01 DIAGNOSIS — N181 Chronic kidney disease, stage 1: Secondary | ICD-10-CM | POA: Diagnosis not present

## 2019-12-01 LAB — COMPREHENSIVE METABOLIC PANEL
ALT: 69 U/L — ABNORMAL HIGH (ref 0–44)
AST: 79 U/L — ABNORMAL HIGH (ref 15–41)
Albumin: 4.2 g/dL (ref 3.5–5.0)
Alkaline Phosphatase: 103 U/L (ref 38–126)
Anion gap: 10 (ref 5–15)
BUN: 14 mg/dL (ref 6–20)
CO2: 25 mmol/L (ref 22–32)
Calcium: 8.8 mg/dL — ABNORMAL LOW (ref 8.9–10.3)
Chloride: 102 mmol/L (ref 98–111)
Creatinine, Ser: 0.73 mg/dL (ref 0.61–1.24)
GFR calc Af Amer: >60 mL/min (ref >60)
GFR calc non Af Amer: >60 mL/min (ref >60)
Glucose, Bld: 195 mg/dL — ABNORMAL HIGH (ref 70–99)
Potassium: 3.3 mmol/L — ABNORMAL LOW (ref 3.5–5.1)
Sodium: 137 mmol/L (ref 135–145)
Total Bilirubin: 0.7 mg/dL (ref 0.3–1.2)
Total Protein: 7.3 g/dL (ref 6.5–8.1)

## 2019-12-01 LAB — CBC WITH DIFFERENTIAL/PLATELET
Abs Immature Granulocytes: 0.07 10*3/uL (ref 0.00–0.07)
Basophils Absolute: 0 10*3/uL (ref 0.0–0.1)
Basophils Relative: 1 %
Eosinophils Absolute: 0.1 10*3/uL (ref 0.0–0.5)
Eosinophils Relative: 2 %
HCT: 36.5 % — ABNORMAL LOW (ref 39.0–52.0)
Hemoglobin: 12.5 g/dL — ABNORMAL LOW (ref 13.0–17.0)
Immature Granulocytes: 1 %
Lymphocytes Relative: 28 %
Lymphs Abs: 1.8 10*3/uL (ref 0.7–4.0)
MCH: 29.5 pg (ref 26.0–34.0)
MCHC: 34.2 g/dL (ref 30.0–36.0)
MCV: 86.1 fL (ref 80.0–100.0)
Monocytes Absolute: 0.4 10*3/uL (ref 0.1–1.0)
Monocytes Relative: 6 %
Neutro Abs: 4.1 10*3/uL (ref 1.7–7.7)
Neutrophils Relative %: 62 %
Platelets: 190 10*3/uL (ref 150–400)
RBC: 4.24 MIL/uL (ref 4.22–5.81)
RDW: 12.9 % (ref 11.5–15.5)
WBC: 6.4 10*3/uL (ref 4.0–10.5)
nRBC: 0 % (ref 0.0–0.2)

## 2019-12-01 MED ORDER — DIPHENHYDRAMINE HCL 50 MG/ML IJ SOLN
25.0000 mg | Freq: Once | INTRAMUSCULAR | Status: AC
  Administered 2019-12-01: 25 mg via INTRAVENOUS
  Filled 2019-12-01: qty 1

## 2019-12-01 MED ORDER — SODIUM CHLORIDE 0.9 % IV SOLN
Freq: Once | INTRAVENOUS | Status: AC
  Filled 2019-12-01: qty 250

## 2019-12-01 MED ORDER — FAMOTIDINE IN NACL 20-0.9 MG/50ML-% IV SOLN
20.0000 mg | Freq: Once | INTRAVENOUS | Status: AC
  Administered 2019-12-01: 20 mg via INTRAVENOUS
  Filled 2019-12-01: qty 50

## 2019-12-01 MED ORDER — SODIUM CHLORIDE 0.9 % IV SOLN
10.0000 mg | Freq: Once | INTRAVENOUS | Status: AC
  Administered 2019-12-01: 10 mg via INTRAVENOUS
  Filled 2019-12-01: qty 10

## 2019-12-01 MED ORDER — SODIUM CHLORIDE 0.9 % IV SOLN
300.0000 mg | Freq: Once | INTRAVENOUS | Status: AC
  Administered 2019-12-01: 300 mg via INTRAVENOUS
  Filled 2019-12-01: qty 30

## 2019-12-01 MED ORDER — SODIUM CHLORIDE 0.9 % IV SOLN
45.0000 mg/m2 | Freq: Once | INTRAVENOUS | Status: AC
  Administered 2019-12-01: 102 mg via INTRAVENOUS
  Filled 2019-12-01: qty 17

## 2019-12-01 MED ORDER — HEPARIN SOD (PORK) LOCK FLUSH 100 UNIT/ML IV SOLN
500.0000 [IU] | Freq: Once | INTRAVENOUS | Status: DC | PRN
  Filled 2019-12-01: qty 5

## 2019-12-01 MED ORDER — SODIUM CHLORIDE 0.9% FLUSH
10.0000 mL | INTRAVENOUS | Status: DC | PRN
  Administered 2019-12-01: 10 mL via INTRAVENOUS
  Filled 2019-12-01: qty 10

## 2019-12-01 MED ORDER — PALONOSETRON HCL INJECTION 0.25 MG/5ML
0.2500 mg | Freq: Once | INTRAVENOUS | Status: AC
  Administered 2019-12-01: 0.25 mg via INTRAVENOUS
  Filled 2019-12-01: qty 5

## 2019-12-01 MED ORDER — HEPARIN SOD (PORK) LOCK FLUSH 100 UNIT/ML IV SOLN
INTRAVENOUS | Status: AC
  Filled 2019-12-01: qty 5

## 2019-12-01 MED ORDER — HEPARIN SOD (PORK) LOCK FLUSH 100 UNIT/ML IV SOLN
500.0000 [IU] | Freq: Once | INTRAVENOUS | Status: AC
  Administered 2019-12-01: 500 [IU] via INTRAVENOUS
  Filled 2019-12-01: qty 5

## 2019-12-02 ENCOUNTER — Ambulatory Visit
Admission: RE | Admit: 2019-12-02 | Discharge: 2019-12-02 | Disposition: A | Discharge status: Home / Self Care | Payer: Medicare Other | Source: Ambulatory Visit | Attending: Radiation Oncology | Admitting: Radiation Oncology

## 2019-12-02 DIAGNOSIS — C3432 Malignant neoplasm of lower lobe, left bronchus or lung: Secondary | ICD-10-CM | POA: Diagnosis not present

## 2019-12-02 DIAGNOSIS — Z510 Encounter for antineoplastic radiation therapy: Secondary | ICD-10-CM | POA: Diagnosis not present

## 2019-12-02 NOTE — Progress Notes (Signed)
[place]  [phone] [phone]) [phone]  ID: Kristopher [name] OB: 1960-06-14  MR#: [id number]  RWE#:[id number]  Patient Care Team: [name], DO as PCP - General (Family Medicine) Gavin Pound, CMA (Inactive) as Certified Medical Assistant Telford Nab, RN as Registered Nurse Vanita Ingles, RN as Case Manager (General Practice) [name], MD as Referring Physician (Pain Medicine) [name], MD as Consulting Physician (Oncology)  CHIEF COMPLAINT: Clinical stage IIIa squamous cell 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  INTERVAL HISTORY: Patient returns to clinic today for further evaluation and consideration of cycle 3 of weekly carboplatin and Taxol.  He is tolerating his treatments well without significant side effects.  He continues to tolerate XRT only with some mild dysphagia that is improved with Carafate.  He denies any weakness or fatigue. He has a good appetite and denies weight loss. He has no neurologic complaints.  He denies any recent fevers or illnesses.  He has no chest pain, shortness of breath, cough, or hemoptysis.  He denies any nausea, vomiting, constipation, or diarrhea.  He has no urinary complaints.  Patient offers no further specific complaints today.  REVIEW OF SYSTEMS:   Review of Systems  Constitutional: Negative.  Negative for fever, malaise/fatigue and weight loss.  Respiratory: Negative.  Negative for cough and shortness of breath.   Cardiovascular: Negative.  Negative for chest pain and leg swelling.  Gastrointestinal: Negative.  Negative for abdominal pain.  Genitourinary: Negative.  Negative for dysuria and flank pain.  Musculoskeletal: Negative.  Negative for back pain.  Skin: Negative.  Negative for rash.  Neurological: Negative.  Negative for dizziness, focal weakness, weakness and headaches.  Psychiatric/Behavioral: Negative.  The patient is not nervous/anxious.     As per HPI.  Otherwise, a complete review of systems is negative.  PAST MEDICAL HISTORY: Past Medical History:  Diagnosis Date  . Allergy   . Arthritis    left foot  . Benign hypertensive kidney disease   . Chronic back pain    Four rods in back  . Diabetes mellitus, type 2 ([place])   . Dyspnea   . GERD (gastroesophageal reflux disease)   . Hypertension   . Malignant neoplasm of lung ([place])   . Migraines    daily    PAST SURGICAL HISTORY: Past Surgical History:  Procedure Laterality Date  . APPENDECTOMY    . BACK SURGERY    . COLONOSCOPY WITH PROPOFOL N/A 02/19/2016   Procedure: COLONOSCOPY WITH PROPOFOL;  Surgeon: Lucilla Lame, MD;  Location: [facility];  Service: Endoscopy;  Laterality: N/A;  . COLONOSCOPY WITH PROPOFOL N/A 01/19/2018   Procedure: COLONOSCOPY WITH PROPOFOL;  Surgeon: Lucilla Lame, MD;  Location: [facility];  Service: Endoscopy;  Laterality: N/A;  Diabetic - oral meds  . DG OPERATIVE LEFT HIP ([place] [facility])     10/19  . ELECTROMAGNETIC NAVIGATION BROCHOSCOPY Left 11/18/2018   Procedure: ELECTROMAGNETIC NAVIGATION BRONCHOSCOPY;  Surgeon: Tyler Pita, MD;  Location: [facility];  Service: Cardiopulmonary;  Laterality: Left;  . FLEXIBLE BRONCHOSCOPY Bilateral 01/20/2019   Procedure: FLEXIBLE BRONCHOSCOPY;  Surgeon: Ottie Glazier, MD;  Location: [facility];  Service: Thoracic;  Laterality: Bilateral;  . FLEXIBLE BRONCHOSCOPY Bilateral 01/22/2019   Procedure: FLEXIBLE BRONCHOSCOPY;  Surgeon: Ottie Glazier, MD;  Location: [facility];  Service: Thoracic;  Laterality: Bilateral;  . FOOT SURGERY Left    Screws and plates  . JOINT REPLACEMENT Left 12/2017   DR Rudene Christians Hip  . KNEE  SURGERY Left    X 2  . LEG SURGERY    . LUNG CANCER SURGERY    . POLYPECTOMY N/A 02/19/2016   Procedure: POLYPECTOMY;  Surgeon: Lucilla Lame, MD;  Location: [place];  Service: Endoscopy;  Laterality: N/A;  . POLYPECTOMY  01/19/2018   Procedure: POLYPECTOMY;  Surgeon: Lucilla Lame, MD;  Location: [place];  Service: Endoscopy;;  . PORTACATH PLACEMENT N/A 11/11/2019   Procedure: INSERTION PORT-A-CATH;  Surgeon: Nestor Lewandowsky, MD;  Location: [facility];  Service: General;  Laterality: N/A;  . THORACOTOMY Left 01/14/2019   Procedure: THORACOTOMY MAJOR, LEFT;  Surgeon: Nestor Lewandowsky, MD;  Location: [facility];  Service: General;  Laterality: Left;  . TOTAL HIP ARTHROPLASTY Left 12/02/2017   Procedure: TOTAL HIP ARTHROPLASTY ANTERIOR APPROACH;  Surgeon: Hessie Knows, MD;  Location: [facility];  Service: Orthopedics;  Laterality: Left;  [name] VIDEO BRONCHOSCOPY Left 01/14/2019   Procedure: VIDEO BRONCHOSCOPY WITH FLUORO, LEFT;  Surgeon: Nestor Lewandowsky, MD;  Location: [facility];  Service: General;  Laterality: Left;  [name] VIDEO BRONCHOSCOPY WITH ENDOBRONCHIAL NAVIGATION N/A 10/15/2019   Procedure: VIDEO BRONCHOSCOPY WITH ENDOBRONCHIAL NAVIGATION;  Surgeon: Ottie Glazier, MD;  Location: [facility];  Service: Thoracic;  Laterality: N/A;  . VIDEO BRONCHOSCOPY WITH ENDOBRONCHIAL ULTRASOUND N/A 10/15/2019   Procedure: VIDEO BRONCHOSCOPY WITH ENDOBRONCHIAL ULTRASOUND;  Surgeon: Ottie Glazier, MD;  Location: [facility];  Service: Thoracic;  Laterality: N/A;    FAMILY HISTORY: Family History  Problem Relation Age of Onset  . Cancer Father   . Diabetes Sister   . Thrombosis Sister     ADVANCED DIRECTIVES (Y/N):  N  HEALTH MAINTENANCE: Social History   Tobacco Use  . Smoking status: Current Every Day Smoker    Packs/day: 0.05    Years: 35.00    Pack years: 1.75    Types: Cigarettes    Last attempt to quit: 01/14/2019    Years since quitting: 0.8  . Smokeless tobacco: Never Used  . Tobacco comment: 1 pack last 3 days   Vaping Use  . Vaping Use: Never used  Substance Use Topics  . Alcohol use: No    Alcohol/week: 0.0 standard drinks  . Drug use: Yes    Types: Oxycodone    Comment: prescribed     Colonoscopy:  PAP:  Bone density:  Lipid panel:  Allergies    Allergen Reactions  . Acetaminophen Swelling  . Aspirin Anaphylaxis  . Epinephrine Anaphylaxis    Does not include albuterol  . Novocain [Procaine] Anaphylaxis  . Penicillins Anaphylaxis    Has patient had a PCN reaction causing immediate rash, facial/tongue/throat swelling, SOB or lightheadedness with hypotension: Yes Has patient had a PCN reaction causing severe rash involving mucus membranes or skin necrosis: No Has patient had a PCN reaction that required hospitalization: Yes Has patient had a PCN reaction occurring within the last 10 years: No If all of the above answers are "NO", then may proceed with Cephalosporin use.   . Strawberry Extract Anaphylaxis  . Shellfish Allergy Hives and Nausea And Vomiting    Current Outpatient Medications  Medication Sig Dispense Refill  . Accu-Chek FastClix Lancets MISC USE TO TEST BLOOD SUGAR 2X A DAY 100 each 12  . ACCU-CHEK GUIDE test strip USE TO TEST BLOOD SUGAR 2X A DAY 100 strip 12  . amLODipine (NORVASC) 5 MG tablet Take 1 tablet (5 mg total) by mouth 2 (two) times daily. 180 tablet 1  . ARNUITY ELLIPTA 100 MCG/ACT AEPB Inhale 1  puff into the lungs daily.     [name] atorvastatin (LIPITOR) 10 MG tablet Take 1 tablet (10 mg total) by mouth daily at 6 PM. 90 tablet 1  . BREZTRI AEROSPHERE 160-9-4.8 MCG/ACT AERO Inhale 2 puffs into the lungs 2 (two) times daily. Maintenance per pulmonology    . diphenhydrAMINE (BENADRYL) 25 MG tablet Take 50 mg by mouth daily.    . Dulaglutide (TRULICITY) 1.5 JG/2.8ZM SOPN Inject 1.5 mg into the skin every Wednesday. 6 mL 1  . empagliflozin (JARDIANCE) 25 MG TABS tablet Take 1 tablet (25 mg total) by mouth daily before breakfast. 30 tablet 3  . esomeprazole (NEXIUM) 40 MG capsule Take 1 capsule (40 mg total) by mouth daily. 90 capsule 3  . hydrALAZINE (APRESOLINE) 100 MG tablet Take 1 tablet (100 mg total) by mouth 2 (two) times daily. 180 tablet 1  . lidocaine (LIDODERM) 5 % Place 1 patch onto the skin daily.  Remove & Discard patch within 12 hours or as directed by MD (Patient taking differently: Place 1 patch onto the skin daily as needed (pain). Remove & Discard patch within 12 hours or as directed by MD) 30 patch 12  . lidocaine-prilocaine (EMLA) cream Apply to affected area once (Patient taking differently: Apply 1 application topically daily as needed (port access). ) 30 g 3  . metFORMIN (GLUCOPHAGE) 500 MG tablet Take 2 tablets (1,000 mg total) by mouth 2 (two) times daily with a meal. 360 tablet 1  . metoprolol tartrate (LOPRESSOR) 50 MG tablet Take 50 mg by mouth 2 (two) times daily.    . montelukast (SINGULAIR) 10 MG tablet Take 10 mg by mouth daily.    [name] morphine (MSIR) 15 MG tablet Take 1 tablet (15 mg total) by mouth every 6 (six) hours as needed for moderate pain or severe pain. Must last 30 days. 120 tablet 0  . [START ON 12/26/2019] morphine (MSIR) 15 MG tablet Take 1 tablet (15 mg total) by mouth every 6 (six) hours as needed for moderate pain or severe pain. Must last 30 days. 120 tablet 0  . [START ON 01/25/2020] morphine (MSIR) 15 MG tablet Take 1 tablet (15 mg total) by mouth every 6 (six) hours as needed for moderate pain or severe pain. Must last 30 days. 120 tablet 0  . Multiple Vitamin (MULTIVITAMIN WITH MINERALS) TABS tablet Take 1 tablet by mouth daily.    Derrill Memo ON 01/11/2020] NONFORMULARY OR COMPOUNDED ITEM Apply 1-2 mLs topically 4 (four) times daily as needed. 10% Ketamine/2% Cyclobenzaprine/6% Gabapentin Cream 240 each 12  . nortriptyline (PAMELOR) 25 MG capsule TAKE 2 CAPSULES (50 MG TOTAL) BY MOUTH AT BEDTIME. 180 capsule 1  . ondansetron (ZOFRAN) 8 MG tablet Take 1 tablet (8 mg total) by mouth 2 (two) times daily as needed for refractory nausea / vomiting. 60 tablet 1  . oxyCODONE (OXY IR/ROXICODONE) 5 MG immediate release tablet Take 1 tablet (5 mg total) by mouth every 4 (four) hours as needed for moderate pain or severe pain. 20 tablet 0  . pantoprazole (PROTONIX) 40 MG  tablet Take 1 tablet (40 mg total) by mouth daily. 15 tablet 2  . Potassium Chloride ER 20 MEQ TBCR TAKE 1 TABLET BY MOUTH EVERY DAY (Patient taking differently: Take 20 mEq by mouth daily. ) 90 tablet 1  . prochlorperazine (COMPAZINE) 10 MG tablet Take 1 tablet (10 mg total) by mouth every 6 (six) hours as needed (Nausea or vomiting). 60 tablet 2  . STIOLTO RESPIMAT  2.5-2.5 MCG/ACT AERS Inhale 2 puffs into the lungs daily. Prn per pulmonology notes    . sucralfate (CARAFATE) 1 g tablet Take 1 tablet (1 g total) by mouth 3 (three) times daily. Dissolve in 3-4 tbsp warm water, swish and swallow 90 tablet 3  . SUMAtriptan (IMITREX) 50 MG tablet Take 1 tablet (50 mg total) by mouth every 2 (two) hours as needed for migraine. Take 1 tab at onset of migraine. May repeat in 2 hours if headache persists or recurs. 10 tablet 12   No current facility-administered medications for this visit.   Facility-Administered Medications Ordered in Other Visits  Medication Dose Route Frequency Provider Last Rate Last Admin  . 0.9 %  sodium chloride infusion    Continuous PRN Dionne Bucy, CRNA   New Bag at 01/22/19 [phone]  . sodium chloride flush (NS) 0.9 % injection 10 mL  10 mL Intravenous PRN [name], MD   10 mL at 12/08/19 0826    OBJECTIVE: Vitals:   12/08/19 0839  BP: 124/88  Pulse: 78  Resp: 20  Temp: 97.6 F (36.4 C)  SpO2: 99%     Body mass index is 32.41 kg/m.    ECOG FS:0 - Asymptomatic  General: Well-developed, well-nourished, no acute distress. Eyes: Pink conjunctiva, anicteric sclera. HEENT: Normocephalic, moist mucous membranes. Lungs: No audible wheezing or coughing. Heart: Regular rate and rhythm. Abdomen: Soft, nontender, no obvious distention. Musculoskeletal: No edema, cyanosis, or clubbing. Neuro: Alert, answering all questions appropriately. Cranial nerves grossly intact. Skin: No rashes or petechiae noted. Psych: Normal affect.   LAB RESULTS:  Lab Results    Component Value Date   NA 139 12/08/2019   K 3.2 (L) 12/08/2019   CL 101 12/08/2019   CO2 27 12/08/2019   GLUCOSE 149 (H) 12/08/2019   BUN 9 12/08/2019   CREATININE 0.78 12/08/2019   CALCIUM 8.7 (L) 12/08/2019   PROT 7.4 12/08/2019   ALBUMIN 4.3 12/08/2019   AST 49 (H) 12/08/2019   ALT 56 (H) 12/08/2019   ALKPHOS 98 12/08/2019   BILITOT 0.8 12/08/2019   GFRNONAA >60 12/08/2019   GFRAA >60 12/01/2019    Lab Results  Component Value Date   WBC 4.5 12/08/2019   NEUTROABS 2.4 12/08/2019   HGB 11.9 (L) 12/08/2019   HCT 34.0 (L) 12/08/2019   MCV 86.3 12/08/2019   PLT 162 12/08/2019     STUDIES: X-ray chest PA or AP  Result Date: 11/11/2019 CLINICAL DATA:  Port-A-Cath placement EXAM: CHEST  1 VIEW COMPARISON:  Chest radiograph November 10, 2019 study obtained earlier in the day FINDINGS: Port-A-Cath tip is in the superior vena cava. No pneumothorax. No edema or airspace opacity. Heart size within normal limits. Pulmonary vascularity normal. No adenopathy appreciable by radiography. No evident bone lesions. IMPRESSION: Port-A-Cath tip in superior vena cava. No pneumothorax. Otherwise no change compared to study obtained earlier in the day. Electronically Signed   By: Lowella Grip III M.D.   On: 11/11/2019 10:51   Chest 2 View  Result Date: 11/10/2019 CLINICAL DATA:  Lung carcinoma. Hypertension. Preoperative Port-A-Cath placement EXAM: CHEST - 2 VIEW COMPARISON:  Chest radiograph October 15, 2019; chest CT October 28, 2019 FINDINGS: No edema or airspace opacity. Heart size and pulmonary vascular normal. Adenopathy noted on recent CT examination is not appreciable by radiography. No evident bone lesions. IMPRESSION: No edema or airspace opacity. Heart size within normal limits. Adenopathy seen on recent CT not appreciable by radiography. Electronically Signed  By: Lowella Grip III M.D.   On: 11/10/2019 13:35   DG C-Arm 1-60 Min-No Report  Result Date: 11/11/2019 Fluoroscopy  was utilized by the requesting physician.  No radiographic interpretation.    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  PLAN:    1.  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  He had a complicated postoperative course. CT scan results from August 14, 2019 reviewed independently with a suspicious subcarinal lymph node measuring 1.4 cm.  Follow-up PET scan on August 23, 2019 revealed hypermetabolism in lymph node highly suspicious for recurrence.  Biopsy confirmed recurrence increasing patient's stage from IA up to IIIA. Will give weekly carboplatinum and Taxol concurrently with XRT for 6-8 cycles.  At the conclusion of his concurrent chemo/XRT, patient will benefit from 1 year of maintenance immunotherapy using durvalumab.  Proceed with cycle 3 of weekly carboplatin and Taxol today.  Continue daily XRT.  Return to clinic in 1 week for further evaluation and consideration of cycle 4. 2.  Pain: Chronic and unrelated to his malignancy.  Continue monitoring and treatment per pain clinic. 3.  Nausea: Patient asked if it were okay to take CBD Gummies for nausea.  From an oncology standpoint this would be okay, but patient did state concern that it would show a positive urine test.  He was instructed to let pain clinic know that he was taking this for oncologic purposes. 4.  Elevated LFTs: Improving.  Monitor.  Proceed with treatment as above. 5.  Dysphagia: Continue Carafate as prescribed.   Patient expressed understanding and was in agreement with this plan. He also understands that He can call clinic at any time with any questions, concerns, or complaints.   Cancer Staging Squamous cell carcinoma of left lung [facility]) Staging form: Lung, AJCC 8th Edition - Clinical stage from 02/12/2019: Stage IIIA (cT1b, cN2, cM0) - Signed by [name], MD on 11/01/2019   [name], MD   12/08/2019 1:05 PM

## 2019-12-03 ENCOUNTER — Other Ambulatory Visit: Payer: Self-pay | Admitting: Pulmonary Disease

## 2019-12-03 ENCOUNTER — Ambulatory Visit
Admission: RE | Admit: 2019-12-03 | Discharge: 2019-12-03 | Disposition: A | Discharge status: Home / Self Care | Payer: Medicare Other | Source: Ambulatory Visit | Attending: Radiation Oncology | Admitting: Radiation Oncology

## 2019-12-03 DIAGNOSIS — Z510 Encounter for antineoplastic radiation therapy: Secondary | ICD-10-CM | POA: Insufficient documentation

## 2019-12-03 DIAGNOSIS — C3432 Malignant neoplasm of lower lobe, left bronchus or lung: Secondary | ICD-10-CM | POA: Insufficient documentation

## 2019-12-03 DIAGNOSIS — C771 Secondary and unspecified malignant neoplasm of intrathoracic lymph nodes: Secondary | ICD-10-CM | POA: Diagnosis not present

## 2019-12-06 ENCOUNTER — Ambulatory Visit
Admission: RE | Admit: 2019-12-06 | Discharge: 2019-12-06 | Disposition: A | Discharge status: Home / Self Care | Payer: Medicare Other | Source: Ambulatory Visit | Attending: Radiation Oncology | Admitting: Radiation Oncology

## 2019-12-06 ENCOUNTER — Other Ambulatory Visit: Payer: Self-pay | Admitting: *Deleted

## 2019-12-06 DIAGNOSIS — Z510 Encounter for antineoplastic radiation therapy: Secondary | ICD-10-CM | POA: Diagnosis not present

## 2019-12-06 DIAGNOSIS — C3432 Malignant neoplasm of lower lobe, left bronchus or lung: Secondary | ICD-10-CM | POA: Diagnosis not present

## 2019-12-06 MED ORDER — SUCRALFATE 1 G PO TABS: 1.0000 g | ORAL_TABLET | Freq: Three times a day (TID) | ORAL | 3 refills | Status: DC

## 2019-12-07 ENCOUNTER — Ambulatory Visit
Admission: RE | Admit: 2019-12-07 | Discharge: 2019-12-07 | Disposition: A | Discharge status: Home / Self Care | Payer: Medicare Other | Source: Ambulatory Visit | Attending: Radiation Oncology | Admitting: Radiation Oncology

## 2019-12-07 DIAGNOSIS — Z510 Encounter for antineoplastic radiation therapy: Secondary | ICD-10-CM | POA: Diagnosis not present

## 2019-12-07 DIAGNOSIS — C3432 Malignant neoplasm of lower lobe, left bronchus or lung: Secondary | ICD-10-CM | POA: Diagnosis not present

## 2019-12-08 ENCOUNTER — Encounter: Payer: Self-pay | Admitting: Oncology

## 2019-12-08 ENCOUNTER — Other Ambulatory Visit: Payer: Self-pay

## 2019-12-08 ENCOUNTER — Ambulatory Visit: Payer: Medicare Other

## 2019-12-08 ENCOUNTER — Ambulatory Visit
Admission: RE | Admit: 2019-12-08 | Discharge: 2019-12-08 | Disposition: A | Discharge status: Home / Self Care | Payer: Medicare Other | Source: Ambulatory Visit | Attending: Radiation Oncology | Admitting: Radiation Oncology

## 2019-12-08 ENCOUNTER — Inpatient Hospital Stay: Payer: Medicare Other

## 2019-12-08 ENCOUNTER — Inpatient Hospital Stay: Payer: Medicare Other | Attending: Oncology

## 2019-12-08 ENCOUNTER — Inpatient Hospital Stay (HOSPITAL_BASED_OUTPATIENT_CLINIC_OR_DEPARTMENT_OTHER): Payer: Medicare Other | Admitting: Oncology

## 2019-12-08 VITALS — BP 118/81 | HR 73 | Temp 97.0°F | Resp 18 | Wt 228.0 lb

## 2019-12-08 VITALS — BP 124/88 | HR 78 | Temp 97.6°F | Resp 20 | Wt 225.9 lb

## 2019-12-08 DIAGNOSIS — D61818 Other pancytopenia: Secondary | ICD-10-CM | POA: Insufficient documentation

## 2019-12-08 DIAGNOSIS — R1310 Dysphagia, unspecified: Secondary | ICD-10-CM | POA: Insufficient documentation

## 2019-12-08 DIAGNOSIS — I10 Essential (primary) hypertension: Secondary | ICD-10-CM | POA: Insufficient documentation

## 2019-12-08 DIAGNOSIS — E1122 Type 2 diabetes mellitus with diabetic chronic kidney disease: Secondary | ICD-10-CM | POA: Diagnosis not present

## 2019-12-08 DIAGNOSIS — C3492 Malignant neoplasm of unspecified part of left bronchus or lung: Secondary | ICD-10-CM

## 2019-12-08 DIAGNOSIS — Z510 Encounter for antineoplastic radiation therapy: Secondary | ICD-10-CM | POA: Diagnosis not present

## 2019-12-08 DIAGNOSIS — Z9049 Acquired absence of other specified parts of digestive tract: Secondary | ICD-10-CM | POA: Insufficient documentation

## 2019-12-08 DIAGNOSIS — F17210 Nicotine dependence, cigarettes, uncomplicated: Secondary | ICD-10-CM | POA: Insufficient documentation

## 2019-12-08 DIAGNOSIS — Z886 Allergy status to analgesic agent status: Secondary | ICD-10-CM | POA: Insufficient documentation

## 2019-12-08 DIAGNOSIS — Z833 Family history of diabetes mellitus: Secondary | ICD-10-CM | POA: Insufficient documentation

## 2019-12-08 DIAGNOSIS — R7989 Other specified abnormal findings of blood chemistry: Secondary | ICD-10-CM | POA: Diagnosis not present

## 2019-12-08 DIAGNOSIS — E876 Hypokalemia: Secondary | ICD-10-CM | POA: Insufficient documentation

## 2019-12-08 DIAGNOSIS — Z8249 Family history of ischemic heart disease and other diseases of the circulatory system: Secondary | ICD-10-CM | POA: Insufficient documentation

## 2019-12-08 DIAGNOSIS — N181 Chronic kidney disease, stage 1: Secondary | ICD-10-CM | POA: Diagnosis not present

## 2019-12-08 DIAGNOSIS — Z809 Family history of malignant neoplasm, unspecified: Secondary | ICD-10-CM | POA: Insufficient documentation

## 2019-12-08 DIAGNOSIS — R110 Nausea: Secondary | ICD-10-CM | POA: Diagnosis not present

## 2019-12-08 DIAGNOSIS — G8929 Other chronic pain: Secondary | ICD-10-CM | POA: Insufficient documentation

## 2019-12-08 DIAGNOSIS — Z5111 Encounter for antineoplastic chemotherapy: Secondary | ICD-10-CM | POA: Diagnosis not present

## 2019-12-08 DIAGNOSIS — C3432 Malignant neoplasm of lower lobe, left bronchus or lung: Secondary | ICD-10-CM | POA: Insufficient documentation

## 2019-12-08 DIAGNOSIS — Z880 Allergy status to penicillin: Secondary | ICD-10-CM | POA: Diagnosis not present

## 2019-12-08 DIAGNOSIS — Z79899 Other long term (current) drug therapy: Secondary | ICD-10-CM | POA: Insufficient documentation

## 2019-12-08 LAB — CBC WITH DIFFERENTIAL/PLATELET
Abs Immature Granulocytes: 0.02 10*3/uL (ref 0.00–0.07)
Basophils Absolute: 0 10*3/uL (ref 0.0–0.1)
Basophils Relative: 1 %
Eosinophils Absolute: 0.1 10*3/uL (ref 0.0–0.5)
Eosinophils Relative: 2 %
HCT: 34 % — ABNORMAL LOW (ref 39.0–52.0)
Hemoglobin: 11.9 g/dL — ABNORMAL LOW (ref 13.0–17.0)
Immature Granulocytes: 0 %
Lymphocytes Relative: 32 %
Lymphs Abs: 1.5 10*3/uL (ref 0.7–4.0)
MCH: 30.2 pg (ref 26.0–34.0)
MCHC: 35 g/dL (ref 30.0–36.0)
MCV: 86.3 fL (ref 80.0–100.0)
Monocytes Absolute: 0.5 10*3/uL (ref 0.1–1.0)
Monocytes Relative: 11 %
Neutro Abs: 2.4 10*3/uL (ref 1.7–7.7)
Neutrophils Relative %: 54 %
Platelets: 162 10*3/uL (ref 150–400)
RBC: 3.94 MIL/uL — ABNORMAL LOW (ref 4.22–5.81)
RDW: 13.2 % (ref 11.5–15.5)
WBC: 4.5 10*3/uL (ref 4.0–10.5)
nRBC: 0 % (ref 0.0–0.2)

## 2019-12-08 LAB — COMPREHENSIVE METABOLIC PANEL
ALT: 56 U/L — ABNORMAL HIGH (ref 0–44)
AST: 49 U/L — ABNORMAL HIGH (ref 15–41)
Albumin: 4.3 g/dL (ref 3.5–5.0)
Alkaline Phosphatase: 98 U/L (ref 38–126)
Anion gap: 11 (ref 5–15)
BUN: 9 mg/dL (ref 6–20)
CO2: 27 mmol/L (ref 22–32)
Calcium: 8.7 mg/dL — ABNORMAL LOW (ref 8.9–10.3)
Chloride: 101 mmol/L (ref 98–111)
Creatinine, Ser: 0.78 mg/dL (ref 0.61–1.24)
GFR calc non Af Amer: >60 mL/min (ref >60)
Glucose, Bld: 149 mg/dL — ABNORMAL HIGH (ref 70–99)
Potassium: 3.2 mmol/L — ABNORMAL LOW (ref 3.5–5.1)
Sodium: 139 mmol/L (ref 135–145)
Total Bilirubin: 0.8 mg/dL (ref 0.3–1.2)
Total Protein: 7.4 g/dL (ref 6.5–8.1)

## 2019-12-08 MED ORDER — DIPHENHYDRAMINE HCL 50 MG/ML IJ SOLN
25.0000 mg | Freq: Once | INTRAMUSCULAR | Status: AC
  Administered 2019-12-08: 25 mg via INTRAVENOUS
  Filled 2019-12-08: qty 1

## 2019-12-08 MED ORDER — SODIUM CHLORIDE 0.9 % IV SOLN
300.0000 mg | Freq: Once | INTRAVENOUS | Status: AC
  Administered 2019-12-08: 300 mg via INTRAVENOUS
  Filled 2019-12-08: qty 30

## 2019-12-08 MED ORDER — FAMOTIDINE IN NACL 20-0.9 MG/50ML-% IV SOLN
20.0000 mg | Freq: Once | INTRAVENOUS | Status: AC
  Administered 2019-12-08: 20 mg via INTRAVENOUS
  Filled 2019-12-08: qty 50

## 2019-12-08 MED ORDER — HEPARIN SOD (PORK) LOCK FLUSH 100 UNIT/ML IV SOLN
500.0000 [IU] | Freq: Once | INTRAVENOUS | Status: AC
  Administered 2019-12-08: 500 [IU] via INTRAVENOUS
  Filled 2019-12-08: qty 5

## 2019-12-08 MED ORDER — PALONOSETRON HCL INJECTION 0.25 MG/5ML
0.2500 mg | Freq: Once | INTRAVENOUS | Status: AC
  Administered 2019-12-08: 0.25 mg via INTRAVENOUS
  Filled 2019-12-08: qty 5

## 2019-12-08 MED ORDER — HEPARIN SOD (PORK) LOCK FLUSH 100 UNIT/ML IV SOLN
INTRAVENOUS | Status: AC
  Filled 2019-12-08: qty 5

## 2019-12-08 MED ORDER — SODIUM CHLORIDE 0.9 % IV SOLN
45.0000 mg/m2 | Freq: Once | INTRAVENOUS | Status: AC
  Administered 2019-12-08: 102 mg via INTRAVENOUS
  Filled 2019-12-08: qty 17

## 2019-12-08 MED ORDER — SODIUM CHLORIDE 0.9% FLUSH
10.0000 mL | INTRAVENOUS | Status: DC | PRN
  Administered 2019-12-08: 10 mL via INTRAVENOUS
  Filled 2019-12-08: qty 10

## 2019-12-08 MED ORDER — SODIUM CHLORIDE 0.9 % IV SOLN
Freq: Once | INTRAVENOUS | Status: AC
  Filled 2019-12-08: qty 250

## 2019-12-08 MED ORDER — SODIUM CHLORIDE 0.9 % IV SOLN
10.0000 mg | Freq: Once | INTRAVENOUS | Status: AC
  Administered 2019-12-08: 10 mg via INTRAVENOUS
  Filled 2019-12-08: qty 10

## 2019-12-08 NOTE — Progress Notes (Signed)
Patient denies any concerns or questions. States he feels a slight pinch in port site. Reports throat is feeling some better since recent medication prescribed. Denies other concerns at this time.

## 2019-12-09 ENCOUNTER — Ambulatory Visit
Admission: RE | Admit: 2019-12-09 | Discharge: 2019-12-09 | Disposition: A | Discharge status: Home / Self Care | Payer: Medicare Other | Source: Ambulatory Visit | Attending: Radiation Oncology | Admitting: Radiation Oncology

## 2019-12-09 DIAGNOSIS — Z510 Encounter for antineoplastic radiation therapy: Secondary | ICD-10-CM | POA: Diagnosis not present

## 2019-12-09 DIAGNOSIS — C3432 Malignant neoplasm of lower lobe, left bronchus or lung: Secondary | ICD-10-CM | POA: Diagnosis not present

## 2019-12-09 NOTE — Progress Notes (Signed)
[place]  [phone] [phone]) [phone]  ID: Kristopher [name] OB: 06/26/1960  MR#: [id number]  ZDG#:[id number]  Patient Care Team: [name], DO as PCP - General (Family Medicine) Gavin Pound, [place] (Inactive) as Certified Medical Assistant Telford Nab, RN as Registered Nurse Vanita Ingles, RN as Case Manager (General Practice) [name], MD as Referring Physician (Pain Medicine) [name], MD as Consulting Physician (Oncology)  CHIEF COMPLAINT: Clinical stage IIIa squamous cell 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  INTERVAL HISTORY: Patient returns to clinic today for further evaluation and consideration of cycle 4 of weekly carboplatinum and Taxol.  He continues to have mild dysphagia that is significantly helped with Carafate.  He is otherwise tolerating his treatments well. He denies any weakness or fatigue. He has a good appetite and denies weight loss. He has no neurologic complaints.  He denies any recent fevers or illnesses.  He has no chest pain, shortness of breath, cough, or hemoptysis.  He denies any nausea, vomiting, constipation, or diarrhea.  He has no urinary complaints.  Patient offers no further specific complaints today.  REVIEW OF SYSTEMS:   Review of Systems  Constitutional: Negative.  Negative for fever, malaise/fatigue and weight loss.  Respiratory: Negative.  Negative for cough and shortness of breath.   Cardiovascular: Negative.  Negative for chest pain and leg swelling.  Gastrointestinal: Negative.  Negative for abdominal pain.  Genitourinary: Negative.  Negative for dysuria and flank pain.  Musculoskeletal: Negative.  Negative for back pain.  Skin: Negative.  Negative for rash.  Neurological: Negative.  Negative for dizziness, focal weakness, weakness and headaches.  Psychiatric/Behavioral: Negative.  The patient is not nervous/anxious.     As per HPI. Otherwise, a complete review of  systems is negative.  PAST MEDICAL HISTORY: Past Medical History:  Diagnosis Date  . Allergy   . Arthritis    left foot  . Benign hypertensive kidney disease   . Chronic back pain    Four rods in back  . Diabetes mellitus, type 2 ([place])   . Dyspnea   . GERD (gastroesophageal reflux disease)   . Hypertension   . Malignant neoplasm of lung ([place])   . Migraines    daily    PAST SURGICAL HISTORY: Past Surgical History:  Procedure Laterality Date  . APPENDECTOMY    . BACK SURGERY    . COLONOSCOPY WITH PROPOFOL N/A 02/19/2016   Procedure: COLONOSCOPY WITH PROPOFOL;  Surgeon: Lucilla Lame, MD;  Location: [place];  Service: Endoscopy;  Laterality: N/A;  . COLONOSCOPY WITH PROPOFOL N/A 01/19/2018   Procedure: COLONOSCOPY WITH PROPOFOL;  Surgeon: Lucilla Lame, MD;  Location: [place];  Service: Endoscopy;  Laterality: N/A;  Diabetic - oral meds  . DG OPERATIVE LEFT HIP ([place] [facility])     10/19  . ELECTROMAGNETIC NAVIGATION BROCHOSCOPY Left 11/18/2018   Procedure: ELECTROMAGNETIC NAVIGATION BRONCHOSCOPY;  Surgeon: Tyler Pita, MD;  Location: [facility];  Service: Cardiopulmonary;  Laterality: Left;  . FLEXIBLE BRONCHOSCOPY Bilateral 01/20/2019   Procedure: FLEXIBLE BRONCHOSCOPY;  Surgeon: Ottie Glazier, MD;  Location: [facility];  Service: Thoracic;  Laterality: Bilateral;  . FLEXIBLE BRONCHOSCOPY Bilateral 01/22/2019   Procedure: FLEXIBLE BRONCHOSCOPY;  Surgeon: Ottie Glazier, MD;  Location: [facility];  Service: Thoracic;  Laterality: Bilateral;  . FOOT SURGERY Left    Screws and plates  . JOINT REPLACEMENT Left 12/2017   DR Rudene Christians Hip  . KNEE SURGERY Left    X 2  .  LEG SURGERY    . LUNG CANCER SURGERY    . POLYPECTOMY N/A 02/19/2016   Procedure: POLYPECTOMY;  Surgeon: Lucilla Lame, MD;  Location: [place];  Service: Endoscopy;  Laterality: N/A;  . POLYPECTOMY  01/19/2018   Procedure: POLYPECTOMY;  Surgeon: Lucilla Lame, MD;  Location: [facility] [place];  Service: Endoscopy;;  . PORTACATH PLACEMENT N/A 11/11/2019   Procedure: INSERTION PORT-A-CATH;  Surgeon: Nestor Lewandowsky, MD;  Location: [facility];  Service: General;  Laterality: N/A;  . THORACOTOMY Left 01/14/2019   Procedure: THORACOTOMY MAJOR, LEFT;  Surgeon: Nestor Lewandowsky, MD;  Location: [facility];  Service: General;  Laterality: Left;  . TOTAL HIP ARTHROPLASTY Left 12/02/2017   Procedure: TOTAL HIP ARTHROPLASTY ANTERIOR APPROACH;  Surgeon: Hessie Knows, MD;  Location: [facility];  Service: Orthopedics;  Laterality: Left;  [name] VIDEO BRONCHOSCOPY Left 01/14/2019   Procedure: VIDEO BRONCHOSCOPY WITH FLUORO, LEFT;  Surgeon: Nestor Lewandowsky, MD;  Location: [facility];  Service: General;  Laterality: Left;  [name] VIDEO BRONCHOSCOPY WITH ENDOBRONCHIAL NAVIGATION N/A 10/15/2019   Procedure: VIDEO BRONCHOSCOPY WITH ENDOBRONCHIAL NAVIGATION;  Surgeon: Ottie Glazier, MD;  Location: [facility];  Service: Thoracic;  Laterality: N/A;  . VIDEO BRONCHOSCOPY WITH ENDOBRONCHIAL ULTRASOUND N/A 10/15/2019   Procedure: VIDEO BRONCHOSCOPY WITH ENDOBRONCHIAL ULTRASOUND;  Surgeon: Ottie Glazier, MD;  Location: [facility];  Service: Thoracic;  Laterality: N/A;    FAMILY HISTORY: Family History  Problem Relation Age of Onset  . Cancer Father   . Diabetes Sister   . Thrombosis Sister     ADVANCED DIRECTIVES (Y/N):  N  HEALTH MAINTENANCE: Social History   Tobacco Use  . Smoking status: Current Every Day Smoker    Packs/day: 0.05    Years: 35.00    Pack years: 1.75    Types: Cigarettes    Last attempt to quit: 01/14/2019    Years since quitting: 0.9  . Smokeless tobacco: Never Used  . Tobacco comment: 1 pack last 3 days   Vaping Use  . Vaping Use: Never used  Substance Use Topics  . Alcohol use: No    Alcohol/week: 0.0 standard drinks  . Drug use: Yes    Types: Oxycodone    Comment: prescribed     Colonoscopy:  PAP:  Bone density:  Lipid panel:  Allergies  Allergen Reactions  . Acetaminophen  Swelling  . Aspirin Anaphylaxis  . Epinephrine Anaphylaxis    Does not include albuterol  . Novocain [Procaine] Anaphylaxis  . Penicillins Anaphylaxis    Has patient had a PCN reaction causing immediate rash, facial/tongue/throat swelling, SOB or lightheadedness with hypotension: Yes Has patient had a PCN reaction causing severe rash involving mucus membranes or skin necrosis: No Has patient had a PCN reaction that required hospitalization: Yes Has patient had a PCN reaction occurring within the last 10 years: No If all of the above answers are "NO", then may proceed with Cephalosporin use.   . Strawberry Extract Anaphylaxis  . Shellfish Allergy Hives and Nausea And Vomiting    Current Outpatient Medications  Medication Sig Dispense Refill  . Accu-Chek FastClix Lancets MISC USE TO TEST BLOOD SUGAR 2X A DAY 100 each 12  . ACCU-CHEK GUIDE test strip USE TO TEST BLOOD SUGAR 2X A DAY 100 strip 12  . amLODipine (NORVASC) 5 MG tablet Take 1 tablet (5 mg total) by mouth 2 (two) times daily. 180 tablet 1  . ARNUITY ELLIPTA 100 MCG/ACT AEPB Inhale 1 puff into the lungs daily.     [name]  atorvastatin (LIPITOR) 10 MG tablet Take 1 tablet (10 mg total) by mouth daily at 6 PM. 90 tablet 1  . BREZTRI AEROSPHERE 160-9-4.8 MCG/ACT AERO Inhale 2 puffs into the lungs 2 (two) times daily. Maintenance per pulmonology    . diphenhydrAMINE (BENADRYL) 25 MG tablet Take 50 mg by mouth daily.    . Dulaglutide (TRULICITY) 1.5 JZ/7.9XT SOPN Inject 1.5 mg into the skin every Wednesday. 6 mL 1  . empagliflozin (JARDIANCE) 25 MG TABS tablet Take 1 tablet (25 mg total) by mouth daily before breakfast. 30 tablet 3  . esomeprazole (NEXIUM) 40 MG capsule Take 1 capsule (40 mg total) by mouth daily. 90 capsule 3  . hydrALAZINE (APRESOLINE) 100 MG tablet Take 1 tablet (100 mg total) by mouth 2 (two) times daily. 180 tablet 1  . lidocaine (LIDODERM) 5 % Place 1 patch onto the skin daily. Remove & Discard patch within 12 hours or  as directed by MD (Patient taking differently: Place 1 patch onto the skin daily as needed (pain). Remove & Discard patch within 12 hours or as directed by MD) 30 patch 12  . lidocaine-prilocaine (EMLA) cream Apply to affected area once (Patient taking differently: Apply 1 application topically daily as needed (port access). ) 30 g 3  . metFORMIN (GLUCOPHAGE) 500 MG tablet Take 2 tablets (1,000 mg total) by mouth 2 (two) times daily with a meal. 360 tablet 1  . metoprolol tartrate (LOPRESSOR) 50 MG tablet Take 50 mg by mouth 2 (two) times daily.    . montelukast (SINGULAIR) 10 MG tablet Take 10 mg by mouth daily.    [name] morphine (MSIR) 15 MG tablet Take 1 tablet (15 mg total) by mouth every 6 (six) hours as needed for moderate pain or severe pain. Must last 30 days. 120 tablet 0  . [START ON 12/26/2019] morphine (MSIR) 15 MG tablet Take 1 tablet (15 mg total) by mouth every 6 (six) hours as needed for moderate pain or severe pain. Must last 30 days. 120 tablet 0  . [START ON 01/25/2020] morphine (MSIR) 15 MG tablet Take 1 tablet (15 mg total) by mouth every 6 (six) hours as needed for moderate pain or severe pain. Must last 30 days. 120 tablet 0  . Multiple Vitamin (MULTIVITAMIN WITH MINERALS) TABS tablet Take 1 tablet by mouth daily.    Derrill Memo ON 01/11/2020] NONFORMULARY OR COMPOUNDED ITEM Apply 1-2 mLs topically 4 (four) times daily as needed. 10% Ketamine/2% Cyclobenzaprine/6% Gabapentin Cream 240 each 12  . nortriptyline (PAMELOR) 25 MG capsule TAKE 2 CAPSULES (50 MG TOTAL) BY MOUTH AT BEDTIME. 180 capsule 1  . ondansetron (ZOFRAN) 8 MG tablet Take 1 tablet (8 mg total) by mouth 2 (two) times daily as needed for refractory nausea / vomiting. 60 tablet 1  . oxyCODONE (OXY IR/ROXICODONE) 5 MG immediate release tablet Take 1 tablet (5 mg total) by mouth every 4 (four) hours as needed for moderate pain or severe pain. 20 tablet 0  . pantoprazole (PROTONIX) 40 MG tablet Take 1 tablet (40 mg total) by  mouth daily. 15 tablet 2  . Potassium Chloride ER 20 MEQ TBCR TAKE 1 TABLET BY MOUTH EVERY DAY (Patient taking differently: Take 20 mEq by mouth daily. ) 90 tablet 1  . prochlorperazine (COMPAZINE) 10 MG tablet Take 1 tablet (10 mg total) by mouth every 6 (six) hours as needed (Nausea or vomiting). 60 tablet 2  . STIOLTO RESPIMAT 2.5-2.5 MCG/ACT AERS Inhale 2 puffs into the lungs daily.  Prn per pulmonology notes    . sucralfate (CARAFATE) 1 g tablet Take 1 tablet (1 g total) by mouth 3 (three) times daily. Dissolve in 3-4 tbsp warm water, swish and swallow 90 tablet 3  . SUMAtriptan (IMITREX) 50 MG tablet Take 1 tablet (50 mg total) by mouth every 2 (two) hours as needed for migraine. Take 1 tab at onset of migraine. May repeat in 2 hours if headache persists or recurs. 10 tablet 12   No current facility-administered medications for this visit.   Facility-Administered Medications Ordered in Other Visits  Medication Dose Route Frequency Provider Last Rate Last Admin  . 0.9 %  sodium chloride infusion    Continuous PRN Dionne Bucy, CRNA   New Bag at 01/22/19 [phone]  . CARBOplatin (PARAPLATIN) 300 mg in sodium chloride 0.9 % 250 mL chemo infusion  300 mg Intravenous Once [name], MD      . heparin lock flush 100 unit/mL  500 Units Intravenous Once [name], MD      . PACLitaxel (TAXOL) 102 mg in sodium chloride 0.9 % 250 mL chemo infusion (</= 68m/m2)  45 mg/m2 (Treatment Plan Recorded) Intravenous Once FLloyd Huger MD 267 mL/hr at 12/15/19 1026 102 mg at 12/15/19 1026  . sodium chloride flush (NS) 0.9 % injection 10 mL  10 mL Intravenous PRN FLloyd Huger MD   10 mL at 12/15/19 0831    OBJECTIVE: Vitals:   12/14/19 1507  BP: 125/88  Pulse: 81  Resp: 18  Temp: 98.1 F (36.7 C)  SpO2: 100%     Body mass index is 31.84 kg/m.    ECOG FS:0 - Asymptomatic  General: Well-developed, well-nourished, no acute distress. Eyes: Pink conjunctiva, anicteric  sclera. HEENT: Normocephalic, moist mucous membranes. Lungs: No audible wheezing or coughing. Heart: Regular rate and rhythm. Abdomen: Soft, nontender, no obvious distention. Musculoskeletal: No edema, cyanosis, or clubbing. Neuro: Alert, answering all questions appropriately. Cranial nerves grossly intact. Skin: No rashes or petechiae noted. Psych: Normal affect.   LAB RESULTS:  Lab Results  Component Value Date   NA 141 12/15/2019   K 3.2 (L) 12/15/2019   CL 106 12/15/2019   CO2 26 12/15/2019   GLUCOSE 117 (H) 12/15/2019   BUN 10 12/15/2019   CREATININE 0.78 12/15/2019   CALCIUM 8.9 12/15/2019   PROT 7.3 12/15/2019   ALBUMIN 4.4 12/15/2019   AST 57 (H) 12/15/2019   ALT 56 (H) 12/15/2019   ALKPHOS 92 12/15/2019   BILITOT 0.8 12/15/2019   GFRNONAA >60 12/15/2019   GFRAA >60 12/01/2019    Lab Results  Component Value Date   WBC 4.1 12/15/2019   NEUTROABS 2.2 12/15/2019   HGB 11.7 (L) 12/15/2019   HCT 34.2 (L) 12/15/2019   MCV 87.7 12/15/2019   PLT 155 12/15/2019     STUDIES: No results found.  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  PLAN:    1.  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  He had a complicated postoperative course. CT scan results from August 14, 2019 reviewed independently with a suspicious subcarinal lymph node measuring 1.4 cm.  Follow-up PET scan on August 23, 2019 revealed hypermetabolism in lymph node highly suspicious for recurrence.  Biopsy confirmed recurrence increasing patient's stage from IA up to IIIA. Will give weekly carboplatinum and Taxol concurrently with XRT for 6-8 cycles.  At the conclusion of his concurrent chemo/XRT, patient will benefit from 1 year of maintenance immunotherapy using durvalumab.  Proceed with cycle 4 of weekly carboplatinum and Taxol today.  Continue daily XRT completing on January 07, 2020.  Return to clinic in 1 week for further evaluation and consideration of cycle 5. 2.  Pain: Chronic and unrelated to his malignancy.  Continue monitoring and treatment per pain clinic. 3.  Nausea: Patient asked if it were okay to take CBD Gummies for nausea.  From an oncology standpoint this would be okay, but patient did state concern that it would show a positive urine test.  He was instructed to let pain clinic know that he was taking this for oncologic purposes. 4.  Elevated LFTs: Chronic and unchanged.  Proceed with treatment as above. 5.  Dysphagia: Continue Carafate as prescribed. 6.  Hypokalemia: Patient was instructed to increase his potassium supplement to tabs twice daily.   Patient expressed understanding and was in agreement with this plan. He also understands that He can call clinic at any time with any questions, concerns, or complaints.   Cancer Staging Squamous cell carcinoma of left lung [facility]) Staging form: Lung, AJCC 8th Edition - Clinical stage from 02/12/2019: Stage IIIA (cT1b, cN2, cM0) - Signed by [name], MD on 11/01/2019   [name], MD   12/15/2019 10:37 AM

## 2019-12-10 ENCOUNTER — Ambulatory Visit
Admission: RE | Admit: 2019-12-10 | Discharge: 2019-12-10 | Disposition: A | Discharge status: Home / Self Care | Payer: Medicare Other | Source: Ambulatory Visit | Attending: Radiation Oncology | Admitting: Radiation Oncology

## 2019-12-10 DIAGNOSIS — C3432 Malignant neoplasm of lower lobe, left bronchus or lung: Secondary | ICD-10-CM | POA: Diagnosis not present

## 2019-12-10 DIAGNOSIS — C771 Secondary and unspecified malignant neoplasm of intrathoracic lymph nodes: Secondary | ICD-10-CM | POA: Diagnosis not present

## 2019-12-10 DIAGNOSIS — Z510 Encounter for antineoplastic radiation therapy: Secondary | ICD-10-CM | POA: Diagnosis not present

## 2019-12-13 ENCOUNTER — Ambulatory Visit
Admission: RE | Admit: 2019-12-13 | Discharge: 2019-12-13 | Disposition: A | Discharge status: Home / Self Care | Payer: Medicare Other | Source: Ambulatory Visit | Attending: Radiation Oncology | Admitting: Radiation Oncology

## 2019-12-13 DIAGNOSIS — Z510 Encounter for antineoplastic radiation therapy: Secondary | ICD-10-CM | POA: Diagnosis not present

## 2019-12-13 DIAGNOSIS — C3432 Malignant neoplasm of lower lobe, left bronchus or lung: Secondary | ICD-10-CM | POA: Diagnosis not present

## 2019-12-14 ENCOUNTER — Ambulatory Visit
Admission: RE | Admit: 2019-12-14 | Discharge: 2019-12-14 | Disposition: A | Discharge status: Home / Self Care | Payer: Medicare Other | Source: Ambulatory Visit | Attending: Radiation Oncology | Admitting: Radiation Oncology

## 2019-12-14 ENCOUNTER — Encounter: Payer: Self-pay | Admitting: Oncology

## 2019-12-14 DIAGNOSIS — Z510 Encounter for antineoplastic radiation therapy: Secondary | ICD-10-CM | POA: Diagnosis not present

## 2019-12-14 DIAGNOSIS — C3432 Malignant neoplasm of lower lobe, left bronchus or lung: Secondary | ICD-10-CM | POA: Diagnosis not present

## 2019-12-14 NOTE — Progress Notes (Signed)
Patient states he is exteremly thirsty all the time.

## 2019-12-15 ENCOUNTER — Inpatient Hospital Stay: Payer: Medicare Other

## 2019-12-15 ENCOUNTER — Other Ambulatory Visit: Payer: Self-pay

## 2019-12-15 ENCOUNTER — Ambulatory Visit
Admission: RE | Admit: 2019-12-15 | Discharge: 2019-12-15 | Disposition: A | Discharge status: Home / Self Care | Payer: Medicare Other | Source: Ambulatory Visit | Attending: Radiation Oncology | Admitting: Radiation Oncology

## 2019-12-15 ENCOUNTER — Inpatient Hospital Stay (HOSPITAL_BASED_OUTPATIENT_CLINIC_OR_DEPARTMENT_OTHER): Payer: Medicare Other | Admitting: Oncology

## 2019-12-15 VITALS — BP 125/88 | HR 81 | Temp 98.1°F | Resp 18 | Wt 221.9 lb

## 2019-12-15 DIAGNOSIS — E876 Hypokalemia: Secondary | ICD-10-CM | POA: Diagnosis not present

## 2019-12-15 DIAGNOSIS — G8929 Other chronic pain: Secondary | ICD-10-CM | POA: Diagnosis not present

## 2019-12-15 DIAGNOSIS — F17210 Nicotine dependence, cigarettes, uncomplicated: Secondary | ICD-10-CM | POA: Diagnosis not present

## 2019-12-15 DIAGNOSIS — N181 Chronic kidney disease, stage 1: Secondary | ICD-10-CM | POA: Diagnosis not present

## 2019-12-15 DIAGNOSIS — Z510 Encounter for antineoplastic radiation therapy: Secondary | ICD-10-CM | POA: Diagnosis not present

## 2019-12-15 DIAGNOSIS — Z833 Family history of diabetes mellitus: Secondary | ICD-10-CM | POA: Diagnosis not present

## 2019-12-15 DIAGNOSIS — R7989 Other specified abnormal findings of blood chemistry: Secondary | ICD-10-CM | POA: Diagnosis not present

## 2019-12-15 DIAGNOSIS — Z809 Family history of malignant neoplasm, unspecified: Secondary | ICD-10-CM | POA: Diagnosis not present

## 2019-12-15 DIAGNOSIS — Z880 Allergy status to penicillin: Secondary | ICD-10-CM | POA: Diagnosis not present

## 2019-12-15 DIAGNOSIS — D61818 Other pancytopenia: Secondary | ICD-10-CM | POA: Diagnosis not present

## 2019-12-15 DIAGNOSIS — Z8249 Family history of ischemic heart disease and other diseases of the circulatory system: Secondary | ICD-10-CM | POA: Diagnosis not present

## 2019-12-15 DIAGNOSIS — Z5111 Encounter for antineoplastic chemotherapy: Secondary | ICD-10-CM | POA: Diagnosis not present

## 2019-12-15 DIAGNOSIS — Z9049 Acquired absence of other specified parts of digestive tract: Secondary | ICD-10-CM | POA: Diagnosis not present

## 2019-12-15 DIAGNOSIS — Z79899 Other long term (current) drug therapy: Secondary | ICD-10-CM | POA: Diagnosis not present

## 2019-12-15 DIAGNOSIS — R110 Nausea: Secondary | ICD-10-CM | POA: Diagnosis not present

## 2019-12-15 DIAGNOSIS — C3492 Malignant neoplasm of unspecified part of left bronchus or lung: Secondary | ICD-10-CM

## 2019-12-15 DIAGNOSIS — E1122 Type 2 diabetes mellitus with diabetic chronic kidney disease: Secondary | ICD-10-CM | POA: Diagnosis not present

## 2019-12-15 DIAGNOSIS — R1310 Dysphagia, unspecified: Secondary | ICD-10-CM | POA: Diagnosis not present

## 2019-12-15 DIAGNOSIS — I10 Essential (primary) hypertension: Secondary | ICD-10-CM | POA: Diagnosis not present

## 2019-12-15 DIAGNOSIS — C3432 Malignant neoplasm of lower lobe, left bronchus or lung: Secondary | ICD-10-CM | POA: Diagnosis not present

## 2019-12-15 DIAGNOSIS — Z886 Allergy status to analgesic agent status: Secondary | ICD-10-CM | POA: Diagnosis not present

## 2019-12-15 LAB — CBC WITH DIFFERENTIAL/PLATELET
Abs Immature Granulocytes: 0.04 10*3/uL (ref 0.00–0.07)
Basophils Absolute: 0 10*3/uL (ref 0.0–0.1)
Basophils Relative: 1 %
Eosinophils Absolute: 0 10*3/uL (ref 0.0–0.5)
Eosinophils Relative: 1 %
HCT: 34.2 % — ABNORMAL LOW (ref 39.0–52.0)
Hemoglobin: 11.7 g/dL — ABNORMAL LOW (ref 13.0–17.0)
Immature Granulocytes: 1 %
Lymphocytes Relative: 32 %
Lymphs Abs: 1.3 10*3/uL (ref 0.7–4.0)
MCH: 30 pg (ref 26.0–34.0)
MCHC: 34.2 g/dL (ref 30.0–36.0)
MCV: 87.7 fL (ref 80.0–100.0)
Monocytes Absolute: 0.5 10*3/uL (ref 0.1–1.0)
Monocytes Relative: 11 %
Neutro Abs: 2.2 10*3/uL (ref 1.7–7.7)
Neutrophils Relative %: 54 %
Platelets: 155 10*3/uL (ref 150–400)
RBC: 3.9 MIL/uL — ABNORMAL LOW (ref 4.22–5.81)
RDW: 13.7 % (ref 11.5–15.5)
WBC: 4.1 10*3/uL (ref 4.0–10.5)
nRBC: 0.5 % — ABNORMAL HIGH (ref 0.0–0.2)

## 2019-12-15 LAB — COMPREHENSIVE METABOLIC PANEL
ALT: 56 U/L — ABNORMAL HIGH (ref 0–44)
AST: 57 U/L — ABNORMAL HIGH (ref 15–41)
Albumin: 4.4 g/dL (ref 3.5–5.0)
Alkaline Phosphatase: 92 U/L (ref 38–126)
Anion gap: 9 (ref 5–15)
BUN: 10 mg/dL (ref 6–20)
CO2: 26 mmol/L (ref 22–32)
Calcium: 8.9 mg/dL (ref 8.9–10.3)
Chloride: 106 mmol/L (ref 98–111)
Creatinine, Ser: 0.78 mg/dL (ref 0.61–1.24)
GFR, Estimated: >60 mL/min (ref >60)
Glucose, Bld: 117 mg/dL — ABNORMAL HIGH (ref 70–99)
Potassium: 3.2 mmol/L — ABNORMAL LOW (ref 3.5–5.1)
Sodium: 141 mmol/L (ref 135–145)
Total Bilirubin: 0.8 mg/dL (ref 0.3–1.2)
Total Protein: 7.3 g/dL (ref 6.5–8.1)

## 2019-12-15 MED ORDER — SODIUM CHLORIDE 0.9 % IV SOLN
45.0000 mg/m2 | Freq: Once | INTRAVENOUS | Status: AC
  Administered 2019-12-15: 102 mg via INTRAVENOUS
  Filled 2019-12-15: qty 17

## 2019-12-15 MED ORDER — SODIUM CHLORIDE 0.9% FLUSH
10.0000 mL | INTRAVENOUS | Status: DC | PRN
  Administered 2019-12-15: 10 mL via INTRAVENOUS
  Filled 2019-12-15: qty 10

## 2019-12-15 MED ORDER — HEPARIN SOD (PORK) LOCK FLUSH 100 UNIT/ML IV SOLN
500.0000 [IU] | Freq: Once | INTRAVENOUS | Status: AC
  Administered 2019-12-15: 500 [IU] via INTRAVENOUS
  Filled 2019-12-15: qty 5

## 2019-12-15 MED ORDER — PALONOSETRON HCL INJECTION 0.25 MG/5ML
0.2500 mg | Freq: Once | INTRAVENOUS | Status: AC
  Administered 2019-12-15: 0.25 mg via INTRAVENOUS
  Filled 2019-12-15: qty 5

## 2019-12-15 MED ORDER — DIPHENHYDRAMINE HCL 50 MG/ML IJ SOLN
25.0000 mg | Freq: Once | INTRAMUSCULAR | Status: AC
  Administered 2019-12-15: 25 mg via INTRAVENOUS
  Filled 2019-12-15: qty 1

## 2019-12-15 MED ORDER — HEPARIN SOD (PORK) LOCK FLUSH 100 UNIT/ML IV SOLN
INTRAVENOUS | Status: AC
  Filled 2019-12-15: qty 5

## 2019-12-15 MED ORDER — FAMOTIDINE IN NACL 20-0.9 MG/50ML-% IV SOLN
20.0000 mg | Freq: Once | INTRAVENOUS | Status: AC
  Administered 2019-12-15: 20 mg via INTRAVENOUS
  Filled 2019-12-15: qty 50

## 2019-12-15 MED ORDER — SODIUM CHLORIDE 0.9 % IV SOLN
Freq: Once | INTRAVENOUS | Status: AC
  Filled 2019-12-15: qty 250

## 2019-12-15 MED ORDER — SODIUM CHLORIDE 0.9 % IV SOLN
10.0000 mg | Freq: Once | INTRAVENOUS | Status: AC
  Administered 2019-12-15: 10 mg via INTRAVENOUS
  Filled 2019-12-15: qty 10

## 2019-12-15 MED ORDER — SODIUM CHLORIDE 0.9 % IV SOLN
300.0000 mg | Freq: Once | INTRAVENOUS | Status: AC
  Administered 2019-12-15: 300 mg via INTRAVENOUS
  Filled 2019-12-15: qty 30

## 2019-12-16 ENCOUNTER — Ambulatory Visit
Admission: RE | Admit: 2019-12-16 | Discharge: 2019-12-16 | Disposition: A | Discharge status: Home / Self Care | Payer: Medicare Other | Source: Ambulatory Visit | Attending: Radiation Oncology | Admitting: Radiation Oncology

## 2019-12-16 DIAGNOSIS — C3432 Malignant neoplasm of lower lobe, left bronchus or lung: Secondary | ICD-10-CM | POA: Diagnosis not present

## 2019-12-16 DIAGNOSIS — Z510 Encounter for antineoplastic radiation therapy: Secondary | ICD-10-CM | POA: Diagnosis not present

## 2019-12-17 ENCOUNTER — Ambulatory Visit
Admission: RE | Admit: 2019-12-17 | Discharge: 2019-12-17 | Disposition: A | Discharge status: Home / Self Care | Payer: Medicare Other | Source: Ambulatory Visit | Attending: Radiation Oncology | Admitting: Radiation Oncology

## 2019-12-17 DIAGNOSIS — C771 Secondary and unspecified malignant neoplasm of intrathoracic lymph nodes: Secondary | ICD-10-CM | POA: Diagnosis not present

## 2019-12-17 DIAGNOSIS — C3432 Malignant neoplasm of lower lobe, left bronchus or lung: Secondary | ICD-10-CM | POA: Diagnosis not present

## 2019-12-17 DIAGNOSIS — Z510 Encounter for antineoplastic radiation therapy: Secondary | ICD-10-CM | POA: Diagnosis not present

## 2019-12-17 NOTE — Progress Notes (Signed)
[place]  [phone] [phone]) [phone]  ID: Kristopher [name] OB: [birth year]  MR#: [id number]  ZHY#:[id number]  Patient Care Team: [name], DO as PCP - General (Family Medicine) Gavin Pound, CMA (Inactive) as Certified Medical Assistant Telford Nab, RN as Registered Nurse Vanita Ingles, RN as Case Manager (General Practice) [name], MD as Referring Physician (Pain Medicine) [name], MD as Consulting Physician (Oncology)  CHIEF COMPLAINT: Clinical stage IIIa squamous cell 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  INTERVAL HISTORY: Patient returns to clinic today for further evaluation and consideration of cycle 5 of weekly carboplatinum and Taxol.  He continues to have dysphagia, but is otherwise tolerating his treatments well.  He denies any weakness or fatigue. He has a fair appetite and denies weight loss. He has no neurologic complaints.  He denies any recent fevers or illnesses.  He has no chest pain, shortness of breath, cough, or hemoptysis.  He denies any nausea, vomiting, constipation, or diarrhea.  He has no urinary complaints.  Patient offers no further specific complaints today.  REVIEW OF SYSTEMS:   Review of Systems  Constitutional: Negative.  Negative for fever, malaise/fatigue and weight loss.  Respiratory: Negative.  Negative for cough and shortness of breath.   Cardiovascular: Negative.  Negative for chest pain and leg swelling.  Gastrointestinal: Negative.  Negative for abdominal pain.  Genitourinary: Negative.  Negative for dysuria and flank pain.  Musculoskeletal: Negative.  Negative for back pain.  Skin: Negative.  Negative for rash.  Neurological: Negative.  Negative for dizziness, focal weakness, weakness and headaches.  Psychiatric/Behavioral: Negative.  The patient is not nervous/anxious.     As per HPI. Otherwise, a complete review of systems is negative.  PAST MEDICAL  HISTORY: Past Medical History:  Diagnosis Date  . Allergy   . Arthritis    left foot  . Benign hypertensive kidney disease   . Chronic back pain    Four rods in back  . Diabetes mellitus, type 2 ([place])   . Dyspnea   . GERD (gastroesophageal reflux disease)   . Hypertension   . Malignant neoplasm of lung ([place])   . Migraines    daily    PAST SURGICAL HISTORY: Past Surgical History:  Procedure Laterality Date  . APPENDECTOMY    . BACK SURGERY    . COLONOSCOPY WITH PROPOFOL N/A 02/19/2016   Procedure: COLONOSCOPY WITH PROPOFOL;  Surgeon: Lucilla Lame, MD;  Location: [place];  Service: Endoscopy;  Laterality: N/A;  . COLONOSCOPY WITH PROPOFOL N/A 01/19/2018   Procedure: COLONOSCOPY WITH PROPOFOL;  Surgeon: Lucilla Lame, MD;  Location: [place];  Service: Endoscopy;  Laterality: N/A;  Diabetic - oral meds  . DG OPERATIVE LEFT HIP ([place] [facility])     10/19  . ELECTROMAGNETIC NAVIGATION BROCHOSCOPY Left 11/18/2018   Procedure: ELECTROMAGNETIC NAVIGATION BRONCHOSCOPY;  Surgeon: Tyler Pita, MD;  Location: [facility];  Service: Cardiopulmonary;  Laterality: Left;  . FLEXIBLE BRONCHOSCOPY Bilateral 01/20/2019   Procedure: FLEXIBLE BRONCHOSCOPY;  Surgeon: Ottie Glazier, MD;  Location: [facility];  Service: Thoracic;  Laterality: Bilateral;  . FLEXIBLE BRONCHOSCOPY Bilateral 01/22/2019   Procedure: FLEXIBLE BRONCHOSCOPY;  Surgeon: Ottie Glazier, MD;  Location: [facility];  Service: Thoracic;  Laterality: Bilateral;  . FOOT SURGERY Left    Screws and plates  . JOINT REPLACEMENT Left 12/2017   DR Rudene Christians Hip  . KNEE SURGERY Left    X 2  . LEG SURGERY    .  LUNG CANCER SURGERY    . POLYPECTOMY N/A 02/19/2016   Procedure: POLYPECTOMY;  Surgeon: Lucilla Lame, MD;  Location: [place];  Service: Endoscopy;  Laterality: N/A;  . POLYPECTOMY  01/19/2018   Procedure: POLYPECTOMY;  Surgeon: Lucilla Lame, MD;  Location: [place];  Service: Endoscopy;;  .  PORTACATH PLACEMENT N/A 11/11/2019   Procedure: INSERTION PORT-A-CATH;  Surgeon: Nestor Lewandowsky, MD;  Location: [facility];  Service: General;  Laterality: N/A;  . THORACOTOMY Left 01/14/2019   Procedure: THORACOTOMY MAJOR, LEFT;  Surgeon: Nestor Lewandowsky, MD;  Location: [facility];  Service: General;  Laterality: Left;  . TOTAL HIP ARTHROPLASTY Left 12/02/2017   Procedure: TOTAL HIP ARTHROPLASTY ANTERIOR APPROACH;  Surgeon: Hessie Knows, MD;  Location: [facility];  Service: Orthopedics;  Laterality: Left;  [name] VIDEO BRONCHOSCOPY Left 01/14/2019   Procedure: VIDEO BRONCHOSCOPY WITH FLUORO, LEFT;  Surgeon: Nestor Lewandowsky, MD;  Location: [facility];  Service: General;  Laterality: Left;  [name] VIDEO BRONCHOSCOPY WITH ENDOBRONCHIAL NAVIGATION N/A 10/15/2019   Procedure: VIDEO BRONCHOSCOPY WITH ENDOBRONCHIAL NAVIGATION;  Surgeon: Ottie Glazier, MD;  Location: [facility];  Service: Thoracic;  Laterality: N/A;  . VIDEO BRONCHOSCOPY WITH ENDOBRONCHIAL ULTRASOUND N/A 10/15/2019   Procedure: VIDEO BRONCHOSCOPY WITH ENDOBRONCHIAL ULTRASOUND;  Surgeon: Ottie Glazier, MD;  Location: [facility];  Service: Thoracic;  Laterality: N/A;    FAMILY HISTORY: Family History  Problem Relation Age of Onset  . Cancer Father   . Diabetes Sister   . Thrombosis Sister     ADVANCED DIRECTIVES (Y/N):  N  HEALTH MAINTENANCE: Social History   Tobacco Use  . Smoking status: Current Every Day Smoker    Packs/day: 0.05    Years: 35.00    Pack years: 1.75    Types: Cigarettes    Last attempt to quit: 01/14/2019    Years since quitting: 0.9  . Smokeless tobacco: Never Used  . Tobacco comment: 1 pack last 3 days   Vaping Use  . Vaping Use: Never used  Substance Use Topics  . Alcohol use: No    Alcohol/week: 0.0 standard drinks  . Drug use: Yes    Types: Oxycodone    Comment: prescribed     Colonoscopy:  PAP:  Bone density:  Lipid panel:  Allergies  Allergen Reactions  . Acetaminophen Swelling  . Aspirin Anaphylaxis  .  Epinephrine Anaphylaxis    Does not include albuterol  . Novocain [Procaine] Anaphylaxis  . Penicillins Anaphylaxis    Has patient had a PCN reaction causing immediate rash, facial/tongue/throat swelling, SOB or lightheadedness with hypotension: Yes Has patient had a PCN reaction causing severe rash involving mucus membranes or skin necrosis: No Has patient had a PCN reaction that required hospitalization: Yes Has patient had a PCN reaction occurring within the last 10 years: No If all of the above answers are "NO", then may proceed with Cephalosporin use.   . Strawberry Extract Anaphylaxis  . Shellfish Allergy Hives and Nausea And Vomiting    Current Outpatient Medications  Medication Sig Dispense Refill  . Accu-Chek FastClix Lancets MISC USE TO TEST BLOOD SUGAR 2X A DAY 100 each 12  . ACCU-CHEK GUIDE test strip USE TO TEST BLOOD SUGAR 2X A DAY 100 strip 12  . amLODipine (NORVASC) 5 MG tablet Take 1 tablet (5 mg total) by mouth 2 (two) times daily. 180 tablet 1  . ARNUITY ELLIPTA 100 MCG/ACT AEPB Inhale 1 puff into the lungs daily.     [name] atorvastatin (LIPITOR) 10 MG tablet Take  1 tablet (10 mg total) by mouth daily at 6 PM. 90 tablet 1  . BREZTRI AEROSPHERE 160-9-4.8 MCG/ACT AERO Inhale 2 puffs into the lungs 2 (two) times daily. Maintenance per pulmonology    . diphenhydrAMINE (BENADRYL) 25 MG tablet Take 50 mg by mouth daily.    . Dulaglutide (TRULICITY) 1.5 HY/0.7PX SOPN Inject 1.5 mg into the skin every Wednesday. 6 mL 1  . empagliflozin (JARDIANCE) 25 MG TABS tablet Take 1 tablet (25 mg total) by mouth daily before breakfast. 30 tablet 3  . esomeprazole (NEXIUM) 40 MG capsule Take 1 capsule (40 mg total) by mouth daily. 90 capsule 3  . hydrALAZINE (APRESOLINE) 100 MG tablet Take 1 tablet (100 mg total) by mouth 2 (two) times daily. 180 tablet 1  . lidocaine (LIDODERM) 5 % Place 1 patch onto the skin daily. Remove & Discard patch within 12 hours or as directed by MD (Patient taking  differently: Place 1 patch onto the skin daily as needed (pain). Remove & Discard patch within 12 hours or as directed by MD) 30 patch 12  . lidocaine-prilocaine (EMLA) cream Apply to affected area once (Patient taking differently: Apply 1 application topically daily as needed (port access). ) 30 g 3  . metFORMIN (GLUCOPHAGE) 500 MG tablet Take 2 tablets (1,000 mg total) by mouth 2 (two) times daily with a meal. 360 tablet 1  . metoprolol tartrate (LOPRESSOR) 50 MG tablet Take 50 mg by mouth 2 (two) times daily.    . montelukast (SINGULAIR) 10 MG tablet Take 10 mg by mouth daily.    [name] morphine (MSIR) 15 MG tablet Take 1 tablet (15 mg total) by mouth every 6 (six) hours as needed for moderate pain or severe pain. Must last 30 days. 120 tablet 0  . [START ON 12/26/2019] morphine (MSIR) 15 MG tablet Take 1 tablet (15 mg total) by mouth every 6 (six) hours as needed for moderate pain or severe pain. Must last 30 days. 120 tablet 0  . [START ON 01/25/2020] morphine (MSIR) 15 MG tablet Take 1 tablet (15 mg total) by mouth every 6 (six) hours as needed for moderate pain or severe pain. Must last 30 days. 120 tablet 0  . Multiple Vitamin (MULTIVITAMIN WITH MINERALS) TABS tablet Take 1 tablet by mouth daily.    Derrill Memo ON 01/11/2020] NONFORMULARY OR COMPOUNDED ITEM Apply 1-2 mLs topically 4 (four) times daily as needed. 10% Ketamine/2% Cyclobenzaprine/6% Gabapentin Cream 240 each 12  . nortriptyline (PAMELOR) 25 MG capsule TAKE 2 CAPSULES (50 MG TOTAL) BY MOUTH AT BEDTIME. 180 capsule 1  . ondansetron (ZOFRAN) 8 MG tablet Take 1 tablet (8 mg total) by mouth 2 (two) times daily as needed for refractory nausea / vomiting. 60 tablet 1  . oxyCODONE (OXY IR/ROXICODONE) 5 MG immediate release tablet Take 1 tablet (5 mg total) by mouth every 4 (four) hours as needed for moderate pain or severe pain. 20 tablet 0  . pantoprazole (PROTONIX) 40 MG tablet Take 1 tablet (40 mg total) by mouth daily. 15 tablet 2  . Potassium  Chloride ER 20 MEQ TBCR TAKE 1 TABLET BY MOUTH EVERY DAY (Patient taking differently: Take 20 mEq by mouth daily. ) 90 tablet 1  . prochlorperazine (COMPAZINE) 10 MG tablet Take 1 tablet (10 mg total) by mouth every 6 (six) hours as needed (Nausea or vomiting). 60 tablet 2  . STIOLTO RESPIMAT 2.5-2.5 MCG/ACT AERS Inhale 2 puffs into the lungs daily. Prn per pulmonology notes    .  sucralfate (CARAFATE) 1 g tablet Take 1 tablet (1 g total) by mouth 3 (three) times daily. Dissolve in 3-4 tbsp warm water, swish and swallow 90 tablet 3  . SUMAtriptan (IMITREX) 50 MG tablet Take 1 tablet (50 mg total) by mouth every 2 (two) hours as needed for migraine. Take 1 tab at onset of migraine. May repeat in 2 hours if headache persists or recurs. 10 tablet 12   No current facility-administered medications for this visit.   Facility-Administered Medications Ordered in Other Visits  Medication Dose Route Frequency Provider Last Rate Last Admin  . 0.9 %  sodium chloride infusion    Continuous PRN Dionne Bucy, CRNA   New Bag at 01/22/19 [phone]  . CARBOplatin (PARAPLATIN) 300 mg in sodium chloride 0.9 % 250 mL chemo infusion  300 mg Intravenous Once [name], MD      . dexamethasone (DECADRON) 10 mg in sodium chloride 0.9 % 50 mL IVPB  10 mg Intravenous Once [name], MD 204 mL/hr at 12/22/19 0950 10 mg at 12/22/19 0950  . heparin lock flush 100 unit/mL  500 Units Intravenous Once [name], MD      . heparin lock flush 100 unit/mL  500 Units Intracatheter Once PRN [name], MD      . PACLitaxel (TAXOL) 102 mg in sodium chloride 0.9 % 250 mL chemo infusion (</= 51m/m2)  45 mg/m2 (Treatment Plan Recorded) Intravenous Once FLloyd Huger MD      . sodium chloride flush (NS) 0.9 % injection 10 mL  10 mL Intracatheter PRN FLloyd Huger MD        OBJECTIVE: Vitals:   12/22/19 0846  BP: 121/86  Pulse: 88  Resp: 20  Temp: 98.9 F (37.2 C)  SpO2: 99%      Body mass index is 31.32 kg/m.    ECOG FS:0 - Asymptomatic  General: Well-developed, well-nourished, no acute distress. Eyes: Pink conjunctiva, anicteric sclera. HEENT: Normocephalic, moist mucous membranes. Lungs: No audible wheezing or coughing. Heart: Regular rate and rhythm. Abdomen: Soft, nontender, no obvious distention. Musculoskeletal: No edema, cyanosis, or clubbing. Neuro: Alert, answering all questions appropriately. Cranial nerves grossly intact. Skin: No rashes or petechiae noted. Psych: Normal affect.   LAB RESULTS:  Lab Results  Component Value Date   NA 138 12/22/2019   K 3.7 12/22/2019   CL 105 12/22/2019   CO2 24 12/22/2019   GLUCOSE 121 (H) 12/22/2019   BUN 8 12/22/2019   CREATININE 0.91 12/22/2019   CALCIUM 9.2 12/22/2019   PROT 7.3 12/22/2019   ALBUMIN 4.2 12/22/2019   AST 51 (H) 12/22/2019   ALT 57 (H) 12/22/2019   ALKPHOS 105 12/22/2019   BILITOT 0.9 12/22/2019   GFRNONAA >60 12/22/2019   GFRAA >60 12/01/2019    Lab Results  Component Value Date   WBC 3.7 (L) 12/22/2019   NEUTROABS 2.1 12/22/2019   HGB 12.0 (L) 12/22/2019   HCT 34.6 (L) 12/22/2019   MCV 86.5 12/22/2019   PLT 137 (L) 12/22/2019     STUDIES: No results found.  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  PLAN:    1.  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  He had a complicated postoperative course. CT scan results from August 14, 2019 reviewed independently with a suspicious subcarinal lymph node measuring 1.4 cm.  Follow-up PET scan  on August 23, 2019 revealed hypermetabolism in lymph node highly suspicious for recurrence.  Biopsy confirmed recurrence increasing patient's stage from IA up to IIIA. Will give weekly carboplatinum and Taxol concurrently with XRT for 6-8 cycles.  At the conclusion of his concurrent chemo/XRT, patient will benefit from 1  year of maintenance immunotherapy using durvalumab.  Proceed with cycle 5 of weekly carboplatinum and Taxol today. Continue daily XRT completing on January 07, 2020.  Return to clinic in 1 week for further evaluation and consideration of cycle 6. 2.  Pain: Chronic and unrelated to his malignancy.  Continue monitoring and treatment per pain clinic. 3.  Nausea: Patient asked if it were okay to take CBD Gummies for nausea.  From an oncology standpoint this would be okay, but patient did state concern that it would show a positive urine test.  He was instructed to let pain clinic know that he was taking this for oncologic purposes. 4.  Elevated LFTs: Chronic and unchanged.  Proceed with treatment as above. 5.  Dysphagia: Patient was given a prescription for Magic mouthwash today. 6.  Hypokalemia: Resolved.  Continue oral potassium supplementation. 7.  Pancytopenia: Mild, monitor.   Patient expressed understanding and was in agreement with this plan. He also understands that He can call clinic at any time with any questions, concerns, or complaints.   Cancer Staging Squamous cell carcinoma of left lung [facility]) Staging form: Lung, AJCC 8th Edition - Clinical stage from 02/12/2019: Stage IIIA (cT1b, cN2, cM0) - Signed by [name], MD on 11/01/2019   [name], MD   12/22/2019 10:00 AM

## 2019-12-20 ENCOUNTER — Ambulatory Visit
Admission: RE | Admit: 2019-12-20 | Discharge: 2019-12-20 | Disposition: A | Discharge status: Home / Self Care | Payer: Medicare Other | Source: Ambulatory Visit | Attending: Radiation Oncology | Admitting: Radiation Oncology

## 2019-12-20 DIAGNOSIS — Z510 Encounter for antineoplastic radiation therapy: Secondary | ICD-10-CM | POA: Diagnosis not present

## 2019-12-20 DIAGNOSIS — C3432 Malignant neoplasm of lower lobe, left bronchus or lung: Secondary | ICD-10-CM | POA: Diagnosis not present

## 2019-12-21 ENCOUNTER — Ambulatory Visit
Admission: RE | Admit: 2019-12-21 | Discharge: 2019-12-21 | Disposition: A | Discharge status: Home / Self Care | Payer: Medicare Other | Source: Ambulatory Visit | Attending: Radiation Oncology | Admitting: Radiation Oncology

## 2019-12-21 ENCOUNTER — Encounter: Payer: Self-pay | Admitting: Oncology

## 2019-12-21 DIAGNOSIS — C3432 Malignant neoplasm of lower lobe, left bronchus or lung: Secondary | ICD-10-CM | POA: Diagnosis not present

## 2019-12-21 DIAGNOSIS — Z510 Encounter for antineoplastic radiation therapy: Secondary | ICD-10-CM | POA: Diagnosis not present

## 2019-12-21 NOTE — Progress Notes (Signed)
Patient called for pre assessment. He denies any pain at this time. States he is dealing with a little pain swallowing and also reports food is not tasting good. He denies other concerns at this time.

## 2019-12-22 ENCOUNTER — Inpatient Hospital Stay: Payer: Medicare Other

## 2019-12-22 ENCOUNTER — Ambulatory Visit
Admission: RE | Admit: 2019-12-22 | Discharge: 2019-12-22 | Disposition: A | Discharge status: Home / Self Care | Payer: Medicare Other | Source: Ambulatory Visit | Attending: Radiation Oncology | Admitting: Radiation Oncology

## 2019-12-22 ENCOUNTER — Other Ambulatory Visit: Payer: Self-pay

## 2019-12-22 ENCOUNTER — Inpatient Hospital Stay (HOSPITAL_BASED_OUTPATIENT_CLINIC_OR_DEPARTMENT_OTHER): Payer: Medicare Other | Admitting: Oncology

## 2019-12-22 ENCOUNTER — Encounter: Payer: Self-pay | Admitting: Oncology

## 2019-12-22 VITALS — BP 121/86 | HR 88 | Temp 98.9°F | Resp 20 | Wt 218.3 lb

## 2019-12-22 DIAGNOSIS — G8929 Other chronic pain: Secondary | ICD-10-CM | POA: Diagnosis not present

## 2019-12-22 DIAGNOSIS — Z9049 Acquired absence of other specified parts of digestive tract: Secondary | ICD-10-CM | POA: Diagnosis not present

## 2019-12-22 DIAGNOSIS — R1310 Dysphagia, unspecified: Secondary | ICD-10-CM | POA: Diagnosis not present

## 2019-12-22 DIAGNOSIS — C3492 Malignant neoplasm of unspecified part of left bronchus or lung: Secondary | ICD-10-CM

## 2019-12-22 DIAGNOSIS — Z880 Allergy status to penicillin: Secondary | ICD-10-CM | POA: Diagnosis not present

## 2019-12-22 DIAGNOSIS — D61818 Other pancytopenia: Secondary | ICD-10-CM | POA: Diagnosis not present

## 2019-12-22 DIAGNOSIS — Z8249 Family history of ischemic heart disease and other diseases of the circulatory system: Secondary | ICD-10-CM | POA: Diagnosis not present

## 2019-12-22 DIAGNOSIS — N181 Chronic kidney disease, stage 1: Secondary | ICD-10-CM | POA: Diagnosis not present

## 2019-12-22 DIAGNOSIS — E876 Hypokalemia: Secondary | ICD-10-CM | POA: Diagnosis not present

## 2019-12-22 DIAGNOSIS — R110 Nausea: Secondary | ICD-10-CM | POA: Diagnosis not present

## 2019-12-22 DIAGNOSIS — F17210 Nicotine dependence, cigarettes, uncomplicated: Secondary | ICD-10-CM | POA: Diagnosis not present

## 2019-12-22 DIAGNOSIS — Z5111 Encounter for antineoplastic chemotherapy: Secondary | ICD-10-CM | POA: Diagnosis not present

## 2019-12-22 DIAGNOSIS — Z510 Encounter for antineoplastic radiation therapy: Secondary | ICD-10-CM | POA: Diagnosis not present

## 2019-12-22 DIAGNOSIS — E1122 Type 2 diabetes mellitus with diabetic chronic kidney disease: Secondary | ICD-10-CM | POA: Diagnosis not present

## 2019-12-22 DIAGNOSIS — I10 Essential (primary) hypertension: Secondary | ICD-10-CM | POA: Diagnosis not present

## 2019-12-22 DIAGNOSIS — R7989 Other specified abnormal findings of blood chemistry: Secondary | ICD-10-CM | POA: Diagnosis not present

## 2019-12-22 DIAGNOSIS — Z809 Family history of malignant neoplasm, unspecified: Secondary | ICD-10-CM | POA: Diagnosis not present

## 2019-12-22 DIAGNOSIS — Z79899 Other long term (current) drug therapy: Secondary | ICD-10-CM | POA: Diagnosis not present

## 2019-12-22 DIAGNOSIS — Z886 Allergy status to analgesic agent status: Secondary | ICD-10-CM | POA: Diagnosis not present

## 2019-12-22 DIAGNOSIS — Z833 Family history of diabetes mellitus: Secondary | ICD-10-CM | POA: Diagnosis not present

## 2019-12-22 DIAGNOSIS — C3432 Malignant neoplasm of lower lobe, left bronchus or lung: Secondary | ICD-10-CM | POA: Diagnosis not present

## 2019-12-22 LAB — CBC WITH DIFFERENTIAL/PLATELET
Abs Immature Granulocytes: 0.04 10*3/uL (ref 0.00–0.07)
Basophils Absolute: 0 10*3/uL (ref 0.0–0.1)
Basophils Relative: 1 %
Eosinophils Absolute: 0 10*3/uL (ref 0.0–0.5)
Eosinophils Relative: 1 %
HCT: 34.6 % — ABNORMAL LOW (ref 39.0–52.0)
Hemoglobin: 12 g/dL — ABNORMAL LOW (ref 13.0–17.0)
Immature Granulocytes: 1 %
Lymphocytes Relative: 29 %
Lymphs Abs: 1.1 10*3/uL (ref 0.7–4.0)
MCH: 30 pg (ref 26.0–34.0)
MCHC: 34.7 g/dL (ref 30.0–36.0)
MCV: 86.5 fL (ref 80.0–100.0)
Monocytes Absolute: 0.4 10*3/uL (ref 0.1–1.0)
Monocytes Relative: 12 %
Neutro Abs: 2.1 10*3/uL (ref 1.7–7.7)
Neutrophils Relative %: 56 %
Platelets: 137 10*3/uL — ABNORMAL LOW (ref 150–400)
RBC: 4 MIL/uL — ABNORMAL LOW (ref 4.22–5.81)
RDW: 14.2 % (ref 11.5–15.5)
WBC: 3.7 10*3/uL — ABNORMAL LOW (ref 4.0–10.5)
nRBC: 0.5 % — ABNORMAL HIGH (ref 0.0–0.2)

## 2019-12-22 LAB — COMPREHENSIVE METABOLIC PANEL
ALT: 57 U/L — ABNORMAL HIGH (ref 0–44)
AST: 51 U/L — ABNORMAL HIGH (ref 15–41)
Albumin: 4.2 g/dL (ref 3.5–5.0)
Alkaline Phosphatase: 105 U/L (ref 38–126)
Anion gap: 9 (ref 5–15)
BUN: 8 mg/dL (ref 6–20)
CO2: 24 mmol/L (ref 22–32)
Calcium: 9.2 mg/dL (ref 8.9–10.3)
Chloride: 105 mmol/L (ref 98–111)
Creatinine, Ser: 0.91 mg/dL (ref 0.61–1.24)
GFR, Estimated: >60 mL/min (ref >60)
Glucose, Bld: 121 mg/dL — ABNORMAL HIGH (ref 70–99)
Potassium: 3.7 mmol/L (ref 3.5–5.1)
Sodium: 138 mmol/L (ref 135–145)
Total Bilirubin: 0.9 mg/dL (ref 0.3–1.2)
Total Protein: 7.3 g/dL (ref 6.5–8.1)

## 2019-12-22 MED ORDER — SODIUM CHLORIDE 0.9 % IV SOLN
10.0000 mg | Freq: Once | INTRAVENOUS | Status: AC
  Administered 2019-12-22: 10 mg via INTRAVENOUS
  Filled 2019-12-22: qty 10

## 2019-12-22 MED ORDER — SODIUM CHLORIDE 0.9% FLUSH
10.0000 mL | Freq: Once | INTRAVENOUS | Status: AC
  Administered 2019-12-22: 10 mL via INTRAVENOUS
  Filled 2019-12-22: qty 10

## 2019-12-22 MED ORDER — HEPARIN SOD (PORK) LOCK FLUSH 100 UNIT/ML IV SOLN
500.0000 [IU] | Freq: Once | INTRAVENOUS | Status: AC | PRN
  Administered 2019-12-22: 500 [IU]
  Filled 2019-12-22: qty 5

## 2019-12-22 MED ORDER — FAMOTIDINE IN NACL 20-0.9 MG/50ML-% IV SOLN
20.0000 mg | Freq: Once | INTRAVENOUS | Status: AC
  Administered 2019-12-22: 20 mg via INTRAVENOUS
  Filled 2019-12-22: qty 50

## 2019-12-22 MED ORDER — SODIUM CHLORIDE 0.9 % IV SOLN
300.0000 mg | Freq: Once | INTRAVENOUS | Status: AC
  Administered 2019-12-22: 300 mg via INTRAVENOUS
  Filled 2019-12-22: qty 30

## 2019-12-22 MED ORDER — SODIUM CHLORIDE 0.9 % IV SOLN
45.0000 mg/m2 | Freq: Once | INTRAVENOUS | Status: AC
  Administered 2019-12-22: 102 mg via INTRAVENOUS
  Filled 2019-12-22: qty 17

## 2019-12-22 MED ORDER — PALONOSETRON HCL INJECTION 0.25 MG/5ML
0.2500 mg | Freq: Once | INTRAVENOUS | Status: AC
  Administered 2019-12-22: 0.25 mg via INTRAVENOUS
  Filled 2019-12-22: qty 5

## 2019-12-22 MED ORDER — DIPHENHYDRAMINE HCL 50 MG/ML IJ SOLN
25.0000 mg | Freq: Once | INTRAMUSCULAR | Status: AC
  Administered 2019-12-22: 25 mg via INTRAVENOUS
  Filled 2019-12-22: qty 1

## 2019-12-22 MED ORDER — SODIUM CHLORIDE 0.9% FLUSH
10.0000 mL | INTRAVENOUS | Status: DC | PRN
  Filled 2019-12-22: qty 10

## 2019-12-22 MED ORDER — HEPARIN SOD (PORK) LOCK FLUSH 100 UNIT/ML IV SOLN
INTRAVENOUS | Status: AC
  Filled 2019-12-22: qty 5

## 2019-12-22 MED ORDER — HEPARIN SOD (PORK) LOCK FLUSH 100 UNIT/ML IV SOLN
500.0000 [IU] | Freq: Once | INTRAVENOUS | Status: DC
  Filled 2019-12-22: qty 5

## 2019-12-22 MED ORDER — SODIUM CHLORIDE 0.9 % IV SOLN
Freq: Once | INTRAVENOUS | Status: AC
  Filled 2019-12-22: qty 250

## 2019-12-23 ENCOUNTER — Ambulatory Visit
Admission: RE | Admit: 2019-12-23 | Discharge: 2019-12-23 | Disposition: A | Discharge status: Home / Self Care | Payer: Medicare Other | Source: Ambulatory Visit | Attending: Radiation Oncology | Admitting: Radiation Oncology

## 2019-12-23 DIAGNOSIS — Z510 Encounter for antineoplastic radiation therapy: Secondary | ICD-10-CM | POA: Diagnosis not present

## 2019-12-23 DIAGNOSIS — C3432 Malignant neoplasm of lower lobe, left bronchus or lung: Secondary | ICD-10-CM | POA: Diagnosis not present

## 2019-12-24 ENCOUNTER — Telehealth: Payer: Self-pay | Admitting: Pharmacist

## 2019-12-24 ENCOUNTER — Ambulatory Visit
Admission: RE | Admit: 2019-12-24 | Discharge: 2019-12-24 | Disposition: A | Discharge status: Home / Self Care | Payer: Medicare Other | Source: Ambulatory Visit | Attending: Radiation Oncology | Admitting: Radiation Oncology

## 2019-12-24 DIAGNOSIS — Z510 Encounter for antineoplastic radiation therapy: Secondary | ICD-10-CM | POA: Diagnosis not present

## 2019-12-24 DIAGNOSIS — C771 Secondary and unspecified malignant neoplasm of intrathoracic lymph nodes: Secondary | ICD-10-CM | POA: Diagnosis not present

## 2019-12-24 DIAGNOSIS — C3432 Malignant neoplasm of lower lobe, left bronchus or lung: Secondary | ICD-10-CM | POA: Diagnosis not present

## 2019-12-24 NOTE — Progress Notes (Signed)
[place]  [phone] [phone]  ID: Kristopher [name] OB: Jul 11, 1960  MR#: [id number]  UUV#:[id number]  Patient Care Team: [name], DO as PCP - General (Family Medicine) Gavin Pound, [place] (Inactive) as Certified Medical Assistant Telford Nab, RN as Registered Nurse Vanita Ingles, RN as Case Manager (General Practice) [name], MD as Referring Physician (Pain Medicine) [name], MD as Consulting Physician (Oncology)  CHIEF COMPLAINT: Clinical stage IIIa squamous cell 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  INTERVAL HISTORY: Patient returns to clinic today for further evaluation and consideration of cycle 6 of weekly carboplatin and Taxol.  His dysphagia has significantly improved with the use of Magic mouthwash.  He currently feels well and is asymptomatic.  He has a good appetite and denies weight loss.  He has no neurologic complaints.  He denies any recent fevers or illnesses.  He has no chest pain, shortness of breath, cough, or hemoptysis.  He denies any nausea, vomiting, constipation, or diarrhea.  He has no urinary complaints.  Patient offers no specific complaints today.   REVIEW OF SYSTEMS:   Review of Systems  Constitutional: Negative.  Negative for fever, malaise/fatigue and weight loss.  Respiratory: Negative.  Negative for cough and shortness of breath.   Cardiovascular: Negative.  Negative for chest pain and leg swelling.  Gastrointestinal: Negative.  Negative for abdominal pain.  Genitourinary: Negative.  Negative for dysuria and flank pain.  Musculoskeletal: Negative.  Negative for back pain.  Skin: Negative.  Negative for rash.  Neurological: Negative.  Negative for dizziness, focal weakness, weakness and headaches.  Psychiatric/Behavioral: Negative.  The patient is not nervous/anxious.     As per HPI. Otherwise, a complete review of systems is negative.  PAST MEDICAL HISTORY: Past  Medical History:  Diagnosis Date  . Allergy   . Arthritis    left foot  . Benign hypertensive kidney disease   . Chronic back pain    Four rods in back  . Diabetes mellitus, type 2 ([place])   . Dyspnea   . GERD (gastroesophageal reflux disease)   . Hypertension   . Malignant neoplasm of lung ([place])   . Migraines    daily    PAST SURGICAL HISTORY: Past Surgical History:  Procedure Laterality Date  . APPENDECTOMY    . BACK SURGERY    . COLONOSCOPY WITH PROPOFOL N/A 02/19/2016   Procedure: COLONOSCOPY WITH PROPOFOL;  Surgeon: Lucilla Lame, MD;  Location: [place];  Service: Endoscopy;  Laterality: N/A;  . COLONOSCOPY WITH PROPOFOL N/A 01/19/2018   Procedure: COLONOSCOPY WITH PROPOFOL;  Surgeon: Lucilla Lame, MD;  Location: [place];  Service: Endoscopy;  Laterality: N/A;  Diabetic - oral meds  . DG OPERATIVE LEFT HIP ([place] [facility])     10/19  . ELECTROMAGNETIC NAVIGATION BROCHOSCOPY Left 11/18/2018   Procedure: ELECTROMAGNETIC NAVIGATION BRONCHOSCOPY;  Surgeon: Tyler Pita, MD;  Location: [facility];  Service: Cardiopulmonary;  Laterality: Left;  . FLEXIBLE BRONCHOSCOPY Bilateral 01/20/2019   Procedure: FLEXIBLE BRONCHOSCOPY;  Surgeon: Ottie Glazier, MD;  Location: [facility];  Service: Thoracic;  Laterality: Bilateral;  . FLEXIBLE BRONCHOSCOPY Bilateral 01/22/2019   Procedure: FLEXIBLE BRONCHOSCOPY;  Surgeon: Ottie Glazier, MD;  Location: [facility];  Service: Thoracic;  Laterality: Bilateral;  . FOOT SURGERY Left    Screws and plates  . JOINT REPLACEMENT Left 12/2017   DR Rudene Christians Hip  . KNEE SURGERY Left    X 2  . LEG SURGERY    .  LUNG CANCER SURGERY    . POLYPECTOMY N/A 02/19/2016   Procedure: POLYPECTOMY;  Surgeon: Lucilla Lame, MD;  Location: [facility];  Service: Endoscopy;  Laterality: N/A;  . POLYPECTOMY  01/19/2018   Procedure: POLYPECTOMY;  Surgeon: Lucilla Lame, MD;  Location: [place];  Service: Endoscopy;;  . PORTACATH PLACEMENT  N/A 11/11/2019   Procedure: INSERTION PORT-A-CATH;  Surgeon: Nestor Lewandowsky, MD;  Location: [facility];  Service: General;  Laterality: N/A;  . THORACOTOMY Left 01/14/2019   Procedure: THORACOTOMY MAJOR, LEFT;  Surgeon: Nestor Lewandowsky, MD;  Location: [facility];  Service: General;  Laterality: Left;  . TOTAL HIP ARTHROPLASTY Left 12/02/2017   Procedure: TOTAL HIP ARTHROPLASTY ANTERIOR APPROACH;  Surgeon: Hessie Knows, MD;  Location: [facility];  Service: Orthopedics;  Laterality: Left;  [name] VIDEO BRONCHOSCOPY Left 01/14/2019   Procedure: VIDEO BRONCHOSCOPY WITH FLUORO, LEFT;  Surgeon: Nestor Lewandowsky, MD;  Location: [facility];  Service: General;  Laterality: Left;  [name] VIDEO BRONCHOSCOPY WITH ENDOBRONCHIAL NAVIGATION N/A 10/15/2019   Procedure: VIDEO BRONCHOSCOPY WITH ENDOBRONCHIAL NAVIGATION;  Surgeon: Ottie Glazier, MD;  Location: [facility];  Service: Thoracic;  Laterality: N/A;  . VIDEO BRONCHOSCOPY WITH ENDOBRONCHIAL ULTRASOUND N/A 10/15/2019   Procedure: VIDEO BRONCHOSCOPY WITH ENDOBRONCHIAL ULTRASOUND;  Surgeon: Ottie Glazier, MD;  Location: [facility];  Service: Thoracic;  Laterality: N/A;    FAMILY HISTORY: Family History  Problem Relation Age of Onset  . Cancer Father   . Diabetes Sister   . Thrombosis Sister     ADVANCED DIRECTIVES (Y/N):  N  HEALTH MAINTENANCE: Social History   Tobacco Use  . Smoking status: Current Every Day Smoker    Packs/day: 0.05    Years: 35.00    Pack years: 1.75    Types: Cigarettes    Last attempt to quit: 01/14/2019    Years since quitting: 0.9  . Smokeless tobacco: Never Used  . Tobacco comment: 1 pack last 3 days   Vaping Use  . Vaping Use: Never used  Substance Use Topics  . Alcohol use: No    Alcohol/week: 0.0 standard drinks  . Drug use: Yes    Types: Oxycodone    Comment: prescribed     Colonoscopy:  PAP:  Bone density:  Lipid panel:  Allergies  Allergen Reactions  . Acetaminophen Swelling  . Aspirin Anaphylaxis  . Epinephrine  Anaphylaxis    Does not include albuterol  . Novocain [Procaine] Anaphylaxis  . Penicillins Anaphylaxis    Has patient had a PCN reaction causing immediate rash, facial/tongue/throat swelling, SOB or lightheadedness with hypotension: Yes Has patient had a PCN reaction causing severe rash involving mucus membranes or skin necrosis: No Has patient had a PCN reaction that required hospitalization: Yes Has patient had a PCN reaction occurring within the last 10 years: No If all of the above answers are "NO", then may proceed with Cephalosporin use.   . Strawberry Extract Anaphylaxis  . Shellfish Allergy Hives and Nausea And Vomiting    Current Outpatient Medications  Medication Sig Dispense Refill  . Accu-Chek FastClix Lancets MISC USE TO TEST BLOOD SUGAR 2X A DAY 100 each 12  . ACCU-CHEK GUIDE test strip USE TO TEST BLOOD SUGAR 2X A DAY 100 strip 12  . amLODipine (NORVASC) 5 MG tablet Take 1 tablet (5 mg total) by mouth 2 (two) times daily. 180 tablet 1  . ARNUITY ELLIPTA 100 MCG/ACT AEPB Inhale 1 puff into the lungs daily.     [name] atorvastatin (LIPITOR) 10 MG tablet Take  1 tablet (10 mg total) by mouth daily at 6 PM. 90 tablet 1  . BREZTRI AEROSPHERE 160-9-4.8 MCG/ACT AERO Inhale 2 puffs into the lungs 2 (two) times daily. Maintenance per pulmonology    . diphenhydrAMINE (BENADRYL) 25 MG tablet Take 50 mg by mouth daily.    . Dulaglutide (TRULICITY) 1.5 XN/1.7GY SOPN Inject 1.5 mg into the skin every Wednesday. 6 mL 1  . empagliflozin (JARDIANCE) 25 MG TABS tablet Take 1 tablet (25 mg total) by mouth daily before breakfast. 30 tablet 3  . esomeprazole (NEXIUM) 40 MG capsule Take 1 capsule (40 mg total) by mouth daily. 90 capsule 3  . hydrALAZINE (APRESOLINE) 100 MG tablet Take 1 tablet (100 mg total) by mouth 2 (two) times daily. 180 tablet 1  . lidocaine (LIDODERM) 5 % Place 1 patch onto the skin daily. Remove & Discard patch within 12 hours or as directed by MD (Patient taking differently:  Place 1 patch onto the skin daily as needed (pain). Remove & Discard patch within 12 hours or as directed by MD) 30 patch 12  . lidocaine-prilocaine (EMLA) cream Apply to affected area once (Patient taking differently: Apply 1 application topically daily as needed (port access). ) 30 g 3  . metFORMIN (GLUCOPHAGE) 500 MG tablet Take 2 tablets (1,000 mg total) by mouth 2 (two) times daily with a meal. 360 tablet 1  . metoprolol tartrate (LOPRESSOR) 50 MG tablet Take 50 mg by mouth 2 (two) times daily.    . montelukast (SINGULAIR) 10 MG tablet Take 10 mg by mouth daily.    [name] morphine (MSIR) 15 MG tablet Take 1 tablet (15 mg total) by mouth every 6 (six) hours as needed for moderate pain or severe pain. Must last 30 days. 120 tablet 0  . [START ON 01/25/2020] morphine (MSIR) 15 MG tablet Take 1 tablet (15 mg total) by mouth every 6 (six) hours as needed for moderate pain or severe pain. Must last 30 days. 120 tablet 0  . Multiple Vitamin (MULTIVITAMIN WITH MINERALS) TABS tablet Take 1 tablet by mouth daily.    Derrill Memo ON 01/11/2020] NONFORMULARY OR COMPOUNDED ITEM Apply 1-2 mLs topically 4 (four) times daily as needed. 10% Ketamine/2% Cyclobenzaprine/6% Gabapentin Cream 240 each 12  . nortriptyline (PAMELOR) 25 MG capsule TAKE 2 CAPSULES (50 MG TOTAL) BY MOUTH AT BEDTIME. 180 capsule 1  . ondansetron (ZOFRAN) 8 MG tablet Take 1 tablet (8 mg total) by mouth 2 (two) times daily as needed for refractory nausea / vomiting. 60 tablet 1  . oxyCODONE (OXY IR/ROXICODONE) 5 MG immediate release tablet Take 1 tablet (5 mg total) by mouth every 4 (four) hours as needed for moderate pain or severe pain. 20 tablet 0  . pantoprazole (PROTONIX) 40 MG tablet Take 1 tablet (40 mg total) by mouth daily. 15 tablet 2  . Potassium Chloride ER 20 MEQ TBCR TAKE 1 TABLET BY MOUTH EVERY DAY (Patient taking differently: Take 20 mEq by mouth daily. ) 90 tablet 1  . prochlorperazine (COMPAZINE) 10 MG tablet Take 1 tablet (10 mg  total) by mouth every 6 (six) hours as needed (Nausea or vomiting). 60 tablet 2  . STIOLTO RESPIMAT 2.5-2.5 MCG/ACT AERS Inhale 2 puffs into the lungs daily. Prn per pulmonology notes    . sucralfate (CARAFATE) 1 g tablet Take 1 tablet (1 g total) by mouth 3 (three) times daily. Dissolve in 3-4 tbsp warm water, swish and swallow 90 tablet 3  . SUMAtriptan (IMITREX) 50 MG  tablet Take 1 tablet (50 mg total) by mouth every 2 (two) hours as needed for migraine. Take 1 tab at onset of migraine. May repeat in 2 hours if headache persists or recurs. 10 tablet 12   No current facility-administered medications for this visit.   Facility-Administered Medications Ordered in Other Visits  Medication Dose Route Frequency Provider Last Rate Last Admin  . 0.9 %  sodium chloride infusion    Continuous PRN Dionne Bucy, CRNA   New Bag at 01/22/19 [phone]  . CARBOplatin (PARAPLATIN) 300 mg in sodium chloride 0.9 % 250 mL chemo infusion  300 mg Intravenous Once [name], MD      . dexamethasone (DECADRON) 10 mg in sodium chloride 0.9 % 50 mL IVPB  10 mg Intravenous Once [name], MD      . famotidine (PEPCID) IVPB 20 mg premix  20 mg Intravenous Once [name], MD 200 mL/hr at 12/29/19 0921 20 mg at 12/29/19 4580  . heparin lock flush 100 unit/mL  500 Units Intravenous Once [name], MD      . PACLitaxel (TAXOL) 102 mg in sodium chloride 0.9 % 250 mL chemo infusion (</= 61m/m2)  45 mg/m2 (Treatment Plan Recorded) Intravenous Once FLloyd Huger MD      . sodium chloride flush (NS) 0.9 % injection 10 mL  10 mL Intravenous PRN FLloyd Huger MD   10 mL at 12/29/19 0818    OBJECTIVE: Vitals:   12/29/19 0832  BP: (!) 139/97  Pulse: 74  Resp: 18  Temp: 97.8 F (36.6 C)  SpO2: 100%     Body mass index is 30.89 kg/m.    ECOG FS:0 - Asymptomatic  General: Well-developed, well-nourished, no acute distress. Eyes: Pink conjunctiva, anicteric sclera. HEENT:  Normocephalic, moist mucous membranes. Lungs: No audible wheezing or coughing. Heart: Regular rate and rhythm. Abdomen: Soft, nontender, no obvious distention. Musculoskeletal: No edema, cyanosis, or clubbing. Neuro: Alert, answering all questions appropriately. Cranial nerves grossly intact. Skin: No rashes or petechiae noted. Psych: Normal affect.  LAB RESULTS:  Lab Results  Component Value Date   NA 140 12/29/2019   K 4.2 12/29/2019   CL 108 12/29/2019   CO2 24 12/29/2019   GLUCOSE 101 (H) 12/29/2019   BUN 7 12/29/2019   CREATININE 0.78 12/29/2019   CALCIUM 8.8 (L) 12/29/2019   PROT 7.0 12/29/2019   ALBUMIN 4.2 12/29/2019   AST 39 12/29/2019   ALT 48 (H) 12/29/2019   ALKPHOS 105 12/29/2019   BILITOT 0.8 12/29/2019   GFRNONAA >60 12/29/2019   GFRAA >60 12/01/2019    Lab Results  Component Value Date   WBC 2.6 (L) 12/29/2019   NEUTROABS 1.4 (L) 12/29/2019   HGB 11.0 (L) 12/29/2019   HCT 32.8 (L) 12/29/2019   MCV 89.9 12/29/2019   PLT 113 (L) 12/29/2019     STUDIES: No results found.  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  PLAN:    1.  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  He had a complicated postoperative course. CT scan results from August 14, 2019 reviewed independently with a suspicious subcarinal lymph node measuring 1.4 cm.  Follow-up PET scan on August 23, 2019 revealed hypermetabolism in lymph node highly suspicious for recurrence.  Biopsy confirmed recurrence increasing patient's stage from IA up to IIIA. Will give weekly carboplatinum and Taxol  concurrently with XRT for 6-8 cycles.  At the conclusion of his concurrent chemo/XRT, patient will benefit from 1 year of maintenance immunotherapy using durvalumab.  Proceed with cycle 6 of weekly carboplatin and Taxol today.  Continue daily XRT completing on January 07, 2020.  Return  to clinic in 1 week for laboratory work and cycle 7.  Patient will then return to clinic on January 19, 2020 to initiate cycle 1 of maintenance durvalumab. 2.  Pain: Chronic and unrelated to his malignancy.  Continue monitoring and treatment per pain clinic. 3.  Nausea: Patient asked if it were okay to take CBD Gummies for nausea.  From an oncology standpoint this would be okay, but patient did state concern that it would show a positive urine test.  He was instructed to let pain clinic know that he was taking this for oncologic purposes. 4.  Elevated LFTs: Nearly resolved.  Monitor. 5.  Dysphagia: Significantly improved.  Continue Magic mouthwash as needed. 6.  Hypokalemia: Resolved.  Continue oral potassium supplementation. 7.  Pancytopenia: Mild, monitor.  Proceed with treatment as above.   Patient expressed understanding and was in agreement with this plan. He also understands that He can call clinic at any time with any questions, concerns, or complaints.   Cancer Staging Squamous cell carcinoma of left lung [facility]) Staging form: Lung, AJCC 8th Edition - Clinical stage from 02/12/2019: Stage IIIA (cT1b, cN2, cM0) - Signed by [name], MD on 11/01/2019   [name], MD   12/29/2019 9:21 AM

## 2019-12-24 NOTE — Chronic Care Management (AMB) (Signed)
Chronic Care Management Pharmacy Assistant   Name: Kristopher [name]  MRN: [id number] DOB: 10-May-1960  Reason for Encounter: Diabetes Disease State Call  Patient Questions:  1.  Have you seen any other providers since your last visit? Yes, 12/22/19-OV-Finnegan, Kathlene November, MD [facility])  2.  Any changes in your medicines or health? Yes, Per Dr. Grayland Ormond office visit 12/22/19; Start Morphine (MSIR) 15mg  on 10/24- Take 1 tablet by mouth every 6 hours as needed for moderate pain or severe pain. Must last 30 days. Jardiance 25mg  added on 9/24 per Dr. Park Liter, DO.    PCP : [name], DO  Allergies:   Allergies  Allergen Reactions  . Acetaminophen Swelling  . Aspirin Anaphylaxis  . Epinephrine Anaphylaxis    Does not include albuterol  . Novocain [Procaine] Anaphylaxis  . Penicillins Anaphylaxis    Has patient had a PCN reaction causing immediate rash, facial/tongue/throat swelling, SOB or lightheadedness with hypotension: Yes Has patient had a PCN reaction causing severe rash involving mucus membranes or skin necrosis: No Has patient had a PCN reaction that required hospitalization: Yes Has patient had a PCN reaction occurring within the last 10 years: No If all of the above answers are "NO", then may proceed with Cephalosporin use.   . Strawberry Extract Anaphylaxis  . Shellfish Allergy Hives and Nausea And Vomiting    Medications: Outpatient Encounter Medications as of 12/24/2019  Medication Sig Note  . Accu-Chek FastClix Lancets MISC USE TO TEST BLOOD SUGAR 2X A DAY   . ACCU-CHEK GUIDE test strip USE TO TEST BLOOD SUGAR 2X A DAY   . amLODipine (NORVASC) 5 MG tablet Take 1 tablet (5 mg total) by mouth 2 (two) times daily.   . ARNUITY ELLIPTA 100 MCG/ACT AEPB Inhale 1 puff into the lungs daily.    [name] atorvastatin (LIPITOR) 10 MG tablet Take 1 tablet (10 mg total) by mouth daily at 6 PM.   . BREZTRI AEROSPHERE 160-9-4.8 MCG/ACT AERO Inhale 2  puffs into the lungs 2 (two) times daily. Maintenance per pulmonology   . diphenhydrAMINE (BENADRYL) 25 MG tablet Take 50 mg by mouth daily.   . Dulaglutide (TRULICITY) 1.5 JA/2.5KN SOPN Inject 1.5 mg into the skin every Wednesday.   . empagliflozin (JARDIANCE) 25 MG TABS tablet Take 1 tablet (25 mg total) by mouth daily before breakfast.   . esomeprazole (NEXIUM) 40 MG capsule Take 1 capsule (40 mg total) by mouth daily.   . hydrALAZINE (APRESOLINE) 100 MG tablet Take 1 tablet (100 mg total) by mouth 2 (two) times daily.   [name] lidocaine (LIDODERM) 5 % Place 1 patch onto the skin daily. Remove & Discard patch within 12 hours or as directed by MD (Patient taking differently: Place 1 patch onto the skin daily as needed (pain). Remove & Discard patch within 12 hours or as directed by MD)   . lidocaine-prilocaine (EMLA) cream Apply to affected area once (Patient taking differently: Apply 1 application topically daily as needed (port access). ) 11/09/2019: Hasnt started  . metFORMIN (GLUCOPHAGE) 500 MG tablet Take 2 tablets (1,000 mg total) by mouth 2 (two) times daily with a meal.   . metoprolol tartrate (LOPRESSOR) 50 MG tablet Take 50 mg by mouth 2 (two) times daily.   . montelukast (SINGULAIR) 10 MG tablet Take 10 mg by mouth daily.   [name] morphine (MSIR) 15 MG tablet Take 1 tablet (15 mg total) by mouth every 6 (six) hours as needed for  moderate pain or severe pain. Must last 30 days.   Derrill Memo ON 12/26/2019] morphine (MSIR) 15 MG tablet Take 1 tablet (15 mg total) by mouth every 6 (six) hours as needed for moderate pain or severe pain. Must last 30 days.   Derrill Memo ON 01/25/2020] morphine (MSIR) 15 MG tablet Take 1 tablet (15 mg total) by mouth every 6 (six) hours as needed for moderate pain or severe pain. Must last 30 days.   . Multiple Vitamin (MULTIVITAMIN WITH MINERALS) TABS tablet Take 1 tablet by mouth daily.   Derrill Memo ON 01/11/2020] NONFORMULARY OR COMPOUNDED ITEM Apply 1-2 mLs topically 4 (four)  times daily as needed. 10% Ketamine/2% Cyclobenzaprine/6% Gabapentin Cream   . nortriptyline (PAMELOR) 25 MG capsule TAKE 2 CAPSULES (50 MG TOTAL) BY MOUTH AT BEDTIME.   [name] ondansetron (ZOFRAN) 8 MG tablet Take 1 tablet (8 mg total) by mouth 2 (two) times daily as needed for refractory nausea / vomiting.   [name] oxyCODONE (OXY IR/ROXICODONE) 5 MG immediate release tablet Take 1 tablet (5 mg total) by mouth every 4 (four) hours as needed for moderate pain or severe pain.   . pantoprazole (PROTONIX) 40 MG tablet Take 1 tablet (40 mg total) by mouth daily.   . Potassium Chloride ER 20 MEQ TBCR TAKE 1 TABLET BY MOUTH EVERY DAY (Patient taking differently: Take 20 mEq by mouth daily. )   . prochlorperazine (COMPAZINE) 10 MG tablet Take 1 tablet (10 mg total) by mouth every 6 (six) hours as needed (Nausea or vomiting).   . STIOLTO RESPIMAT 2.5-2.5 MCG/ACT AERS Inhale 2 puffs into the lungs daily. Prn per pulmonology notes   . sucralfate (CARAFATE) 1 g tablet Take 1 tablet (1 g total) by mouth 3 (three) times daily. Dissolve in 3-4 tbsp warm water, swish and swallow   . SUMAtriptan (IMITREX) 50 MG tablet Take 1 tablet (50 mg total) by mouth every 2 (two) hours as needed for migraine. Take 1 tab at onset of migraine. May repeat in 2 hours if headache persists or recurs.    Facility-Administered Encounter Medications as of 12/24/2019  Medication  . 0.9 %  sodium chloride infusion    Current Diagnosis: Patient Active Problem List   Diagnosis Date Noted  . Malignant neoplasm of lung ([place])   . Goals of care, counseling/discussion 11/01/2019  . Pulmonary emphysema ([place]) 08/25/2019  . OSA (obstructive sleep apnea) 06/16/2019  . History of colonic polyps 02/02/2019  . Shortness of breath 02/02/2019  . Chronic upper extremity pain (Third area of Pain) (Bilateral) (L>R) 01/19/2019  . Squamous cell carcinoma of left lung ([place]) 01/16/2019  . Lung mass 01/14/2019  . Left lower lobe pulmonary nodule 10/26/2018  .  History of Allergy to amide type local anesthetic (Lidocaine) 10/21/2018    Class: History of  . Hyperlipidemia associated with type 2 diabetes mellitus ([place]) 07/20/2018  . Type 2 diabetes mellitus with stage 1 chronic kidney disease, without long-term current use of insulin ([place]) 12/30/2017  . S/P hip replacement 12/02/2017  . Hip arthritis 10/01/2017  . Aortic atherosclerosis ([place]) 08/25/2017  . Left-sided weakness 08/13/2017  . Musculoskeletal pain, chronic 04/03/2017  . History of tobacco abuse 06/24/2016  . GERD (gastroesophageal reflux disease) 06/24/2016  . Tobacco abuse 06/24/2016  . Pharmacologic therapy   . Benign neoplasm of ascending colon   . Polyp of sigmoid colon   . Rectal polyp   . Benign hypertensive renal disease 01/22/2016  . Migraine 01/22/2016  . Chronic  pain syndrome 01/11/2016  . Chronic sacroiliac joint pain (Bilateral) (L>R) 05/31/2015  . Chronic hip pain (Left) 05/31/2015  . Lumbar facet syndrome (Location of Primary Source of Pain) (Bilateral) (L>R) 05/31/2015  . Chronic lower extremity pain (Secondary area of Pain) (Left) 05/31/2015  . Greater occipital neuralgia (Right) 05/31/2015  . Retrolisthesis of L5-S1 02/06/2015  . Cervical disc herniation (C4-5 and C5-6) 02/06/2015  . Lumbar disc herniation (L5-S1) 02/06/2015  . Hypokalemia 02/01/2015  . Cervical spinal stenosis (C4-5) 01/06/2015  . Cervical foraminal stenosis (Bilateral C5-6) 01/06/2015  . Chronic low back pain (Primary Area of Pain) (Bilateral) (L>R) 01/05/2015  . Lumbar spondylosis 01/05/2015  . Chronic lumbar radicular pain (Secondary area of Pain) (S1 dermatomal) (Left) 01/05/2015  . 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) 01/05/2015  . Chronic neck pain (posterior midline) (Bilateral) (L>R) 01/05/2015  . Cervical spondylosis 01/05/2015  . Chronic cervical radicular pain (Third area of Pain) (Bilateral) (C5/C6 dermatome) (L>R) 01/05/2015  . Long term current use  of opiate analgesic 01/05/2015  . Long term prescription opiate use 01/05/2015  . Opiate use (60 MME/Day) 01/05/2015  . Uncomplicated opioid dependence ([place]) 01/05/2015   Recent Relevant Labs: Lab Results  Component Value Date/Time   HGBA1C 8.8 (H) 11/26/2019 04:13 PM   HGBA1C 8.4 (H) 08/25/2019 08:32 AM   MICROALBUR 150 (H) 04/27/2019 08:07 AM   MICROALBUR 80 (H) 04/08/2018 11:03 AM    Kidney Function/ Lab Results  Component Value Date/Time   CREATININE 0.91 12/22/2019 08:18 AM   CREATININE 0.78 12/15/2019 08:27 AM   GFRNONAA >60 12/22/2019 08:18 AM   GFRAA >60 12/01/2019 08:15 AM    . Current antihyperglycemic regimen:  o Trulicity 1.5MG /0.5ML (Inject 1.5 mg into the skin every Wednesday) , Metformin 500MG  (Take 2 tablets-1,000mg  total-by mouth 2 times daily with a meal) , Jardiance 25MG  Added on 9/24 (Take 1 tablet by mouth daily before breakfast).  . What recent interventions/DTPs have been made to improve glycemic control:  o Continue to take Trulicity and Metformin and add Jardiance per Dr.Johnson on 9/24.  [name] Have there been any recent hospitalizations or ED visits since last visit with CPP? No   . Patient denies hypoglycemic symptoms, including Pale, Sweaty, Shaky, Hungry, Nervous/irritable and Vision changes   . Patient denies hyperglycemic symptoms, including blurry vision, excessive thirst, fatigue, polyuria and weakness   . How often are you checking your blood sugar? twice daily, in the morning before eating or drinking and at bedtime   . What are your blood sugars ranging?  o Fasting: 130 o Before meals: n/a o After meals: 145 o Bedtime: 140  . During the week, how often does your blood glucose drop below 70? Never   . Are you checking your feet daily/regularly? Patient states he checks his feet daily/regularly with no issues found.  Adherence Review: Is the patient currently on a STATIN medication? Yes Is the patient currently on ACE/ARB medication?  No Does the patient have >5 day gap between last estimated fill dates? No   Goals Addressed   None     12/24/19-CPA spoke with the patient to follow up regarding his blood sugar monitoring. The patient stated he goes to the cancer center daily with no medication or health changes. Per patient; he continues to take an antihyperglycemic regimen of Trulicity 1.5MG /0.5ML (Inject 1.5 mg into the skin every Wednesday), Metformin 500MG  (Take 2 tablets-1,000mg  total-by mouth 2 times daily with a meal), Jardiance 25MG  Added on 9/24 (Take 1  tablet by mouth daily before breakfast) is doing him just fine with no new complaints. The patient denied having any hypoglycemic or hyperglycemic episodes. The patient confirmed he is checking his blood sugars first thing in the morning, after meals, and before bed. The patient states his glucose levels do not go below 130 and do not go above 160. Per patient; he eats jolly ranchers, now and laters, and gummies. The patient states his sense of taste is not that well however he usually makes meals that consist of: chicken, steak, rice, corn, broiled ribs, and plenty of vegetables. Because of his dysphagia, he makes oatmeal with a scoop of peanut butter before bedtime. The patient voiced his blood sugar is stable with no issues. Birdena Crandall, CPP notified.   Follow-Up:  Pharmacist Review    Raynelle Highland, [place] Assistant [phone]

## 2019-12-27 ENCOUNTER — Ambulatory Visit
Admission: RE | Admit: 2019-12-27 | Discharge: 2019-12-27 | Disposition: A | Discharge status: Home / Self Care | Payer: Medicare Other | Source: Ambulatory Visit | Attending: Radiation Oncology | Admitting: Radiation Oncology

## 2019-12-27 DIAGNOSIS — C3432 Malignant neoplasm of lower lobe, left bronchus or lung: Secondary | ICD-10-CM | POA: Diagnosis not present

## 2019-12-27 DIAGNOSIS — Z510 Encounter for antineoplastic radiation therapy: Secondary | ICD-10-CM | POA: Diagnosis not present

## 2019-12-28 ENCOUNTER — Ambulatory Visit
Admission: RE | Admit: 2019-12-28 | Discharge: 2019-12-28 | Disposition: A | Discharge status: Home / Self Care | Payer: Medicare Other | Source: Ambulatory Visit | Attending: Radiation Oncology | Admitting: Radiation Oncology

## 2019-12-28 ENCOUNTER — Encounter: Payer: Self-pay | Admitting: Oncology

## 2019-12-28 DIAGNOSIS — C3432 Malignant neoplasm of lower lobe, left bronchus or lung: Secondary | ICD-10-CM | POA: Diagnosis not present

## 2019-12-28 DIAGNOSIS — Z510 Encounter for antineoplastic radiation therapy: Secondary | ICD-10-CM | POA: Diagnosis not present

## 2019-12-28 NOTE — Progress Notes (Signed)
Patient states magic mouthwash has helped with mouth pain, he does state mouthwash has caused diarrhea.  He also states makes him feel "high". Does not have any other concerns at this time.

## 2019-12-29 ENCOUNTER — Inpatient Hospital Stay: Payer: Medicare Other

## 2019-12-29 ENCOUNTER — Ambulatory Visit
Admission: RE | Admit: 2019-12-29 | Discharge: 2019-12-29 | Disposition: A | Discharge status: Home / Self Care | Payer: Medicare Other | Source: Ambulatory Visit | Attending: Radiation Oncology | Admitting: Radiation Oncology

## 2019-12-29 ENCOUNTER — Inpatient Hospital Stay (HOSPITAL_BASED_OUTPATIENT_CLINIC_OR_DEPARTMENT_OTHER): Payer: Medicare Other | Admitting: Oncology

## 2019-12-29 ENCOUNTER — Other Ambulatory Visit: Payer: Self-pay

## 2019-12-29 ENCOUNTER — Encounter: Payer: Self-pay | Admitting: Oncology

## 2019-12-29 VITALS — BP 139/97 | HR 74 | Temp 97.8°F | Resp 18 | Wt 215.3 lb

## 2019-12-29 DIAGNOSIS — Z510 Encounter for antineoplastic radiation therapy: Secondary | ICD-10-CM | POA: Diagnosis not present

## 2019-12-29 DIAGNOSIS — Z9049 Acquired absence of other specified parts of digestive tract: Secondary | ICD-10-CM | POA: Diagnosis not present

## 2019-12-29 DIAGNOSIS — Z79899 Other long term (current) drug therapy: Secondary | ICD-10-CM | POA: Diagnosis not present

## 2019-12-29 DIAGNOSIS — R7989 Other specified abnormal findings of blood chemistry: Secondary | ICD-10-CM | POA: Diagnosis not present

## 2019-12-29 DIAGNOSIS — E1122 Type 2 diabetes mellitus with diabetic chronic kidney disease: Secondary | ICD-10-CM | POA: Diagnosis not present

## 2019-12-29 DIAGNOSIS — C3492 Malignant neoplasm of unspecified part of left bronchus or lung: Secondary | ICD-10-CM | POA: Diagnosis not present

## 2019-12-29 DIAGNOSIS — D61818 Other pancytopenia: Secondary | ICD-10-CM | POA: Diagnosis not present

## 2019-12-29 DIAGNOSIS — Z833 Family history of diabetes mellitus: Secondary | ICD-10-CM | POA: Diagnosis not present

## 2019-12-29 DIAGNOSIS — Z5111 Encounter for antineoplastic chemotherapy: Secondary | ICD-10-CM | POA: Diagnosis not present

## 2019-12-29 DIAGNOSIS — Z880 Allergy status to penicillin: Secondary | ICD-10-CM | POA: Diagnosis not present

## 2019-12-29 DIAGNOSIS — N181 Chronic kidney disease, stage 1: Secondary | ICD-10-CM | POA: Diagnosis not present

## 2019-12-29 DIAGNOSIS — Z809 Family history of malignant neoplasm, unspecified: Secondary | ICD-10-CM | POA: Diagnosis not present

## 2019-12-29 DIAGNOSIS — Z886 Allergy status to analgesic agent status: Secondary | ICD-10-CM | POA: Diagnosis not present

## 2019-12-29 DIAGNOSIS — E876 Hypokalemia: Secondary | ICD-10-CM | POA: Diagnosis not present

## 2019-12-29 DIAGNOSIS — G8929 Other chronic pain: Secondary | ICD-10-CM | POA: Diagnosis not present

## 2019-12-29 DIAGNOSIS — C3432 Malignant neoplasm of lower lobe, left bronchus or lung: Secondary | ICD-10-CM | POA: Diagnosis not present

## 2019-12-29 DIAGNOSIS — Z8249 Family history of ischemic heart disease and other diseases of the circulatory system: Secondary | ICD-10-CM | POA: Diagnosis not present

## 2019-12-29 DIAGNOSIS — R1310 Dysphagia, unspecified: Secondary | ICD-10-CM | POA: Diagnosis not present

## 2019-12-29 DIAGNOSIS — I10 Essential (primary) hypertension: Secondary | ICD-10-CM | POA: Diagnosis not present

## 2019-12-29 DIAGNOSIS — R110 Nausea: Secondary | ICD-10-CM | POA: Diagnosis not present

## 2019-12-29 DIAGNOSIS — F17210 Nicotine dependence, cigarettes, uncomplicated: Secondary | ICD-10-CM | POA: Diagnosis not present

## 2019-12-29 LAB — CBC WITH DIFFERENTIAL/PLATELET
Abs Immature Granulocytes: 0.02 10*3/uL (ref 0.00–0.07)
Basophils Absolute: 0 10*3/uL (ref 0.0–0.1)
Basophils Relative: 1 %
Eosinophils Absolute: 0 10*3/uL (ref 0.0–0.5)
Eosinophils Relative: 1 %
HCT: 32.8 % — ABNORMAL LOW (ref 39.0–52.0)
Hemoglobin: 11 g/dL — ABNORMAL LOW (ref 13.0–17.0)
Immature Granulocytes: 1 %
Lymphocytes Relative: 30 %
Lymphs Abs: 0.8 10*3/uL (ref 0.7–4.0)
MCH: 30.1 pg (ref 26.0–34.0)
MCHC: 33.5 g/dL (ref 30.0–36.0)
MCV: 89.9 fL (ref 80.0–100.0)
Monocytes Absolute: 0.3 10*3/uL (ref 0.1–1.0)
Monocytes Relative: 13 %
Neutro Abs: 1.4 10*3/uL — ABNORMAL LOW (ref 1.7–7.7)
Neutrophils Relative %: 54 %
Platelets: 113 10*3/uL — ABNORMAL LOW (ref 150–400)
RBC: 3.65 MIL/uL — ABNORMAL LOW (ref 4.22–5.81)
RDW: 15 % (ref 11.5–15.5)
WBC: 2.6 10*3/uL — ABNORMAL LOW (ref 4.0–10.5)
nRBC: 1.2 % — ABNORMAL HIGH (ref 0.0–0.2)

## 2019-12-29 LAB — COMPREHENSIVE METABOLIC PANEL
ALT: 48 U/L — ABNORMAL HIGH (ref 0–44)
AST: 39 U/L (ref 15–41)
Albumin: 4.2 g/dL (ref 3.5–5.0)
Alkaline Phosphatase: 105 U/L (ref 38–126)
Anion gap: 8 (ref 5–15)
BUN: 7 mg/dL (ref 6–20)
CO2: 24 mmol/L (ref 22–32)
Calcium: 8.8 mg/dL — ABNORMAL LOW (ref 8.9–10.3)
Chloride: 108 mmol/L (ref 98–111)
Creatinine, Ser: 0.78 mg/dL (ref 0.61–1.24)
GFR, Estimated: >60 mL/min (ref >60)
Glucose, Bld: 101 mg/dL — ABNORMAL HIGH (ref 70–99)
Potassium: 4.2 mmol/L (ref 3.5–5.1)
Sodium: 140 mmol/L (ref 135–145)
Total Bilirubin: 0.8 mg/dL (ref 0.3–1.2)
Total Protein: 7 g/dL (ref 6.5–8.1)

## 2019-12-29 MED ORDER — FAMOTIDINE IN NACL 20-0.9 MG/50ML-% IV SOLN
20.0000 mg | Freq: Once | INTRAVENOUS | Status: AC
  Administered 2019-12-29: 20 mg via INTRAVENOUS
  Filled 2019-12-29: qty 50

## 2019-12-29 MED ORDER — PALONOSETRON HCL INJECTION 0.25 MG/5ML
0.2500 mg | Freq: Once | INTRAVENOUS | Status: AC
  Administered 2019-12-29: 0.25 mg via INTRAVENOUS
  Filled 2019-12-29: qty 5

## 2019-12-29 MED ORDER — DIPHENHYDRAMINE HCL 50 MG/ML IJ SOLN
25.0000 mg | Freq: Once | INTRAMUSCULAR | Status: AC
  Administered 2019-12-29: 25 mg via INTRAVENOUS
  Filled 2019-12-29: qty 1

## 2019-12-29 MED ORDER — SODIUM CHLORIDE 0.9 % IV SOLN
10.0000 mg | Freq: Once | INTRAVENOUS | Status: AC
  Administered 2019-12-29: 10 mg via INTRAVENOUS
  Filled 2019-12-29: qty 10

## 2019-12-29 MED ORDER — SODIUM CHLORIDE 0.9 % IV SOLN
Freq: Once | INTRAVENOUS | Status: AC
  Filled 2019-12-29: qty 250

## 2019-12-29 MED ORDER — SODIUM CHLORIDE 0.9 % IV SOLN
45.0000 mg/m2 | Freq: Once | INTRAVENOUS | Status: AC
  Administered 2019-12-29: 102 mg via INTRAVENOUS
  Filled 2019-12-29: qty 17

## 2019-12-29 MED ORDER — SODIUM CHLORIDE 0.9% FLUSH
10.0000 mL | INTRAVENOUS | Status: DC | PRN
  Administered 2019-12-29: 10 mL via INTRAVENOUS
  Filled 2019-12-29: qty 10

## 2019-12-29 MED ORDER — HEPARIN SOD (PORK) LOCK FLUSH 100 UNIT/ML IV SOLN
INTRAVENOUS | Status: AC
  Filled 2019-12-29: qty 5

## 2019-12-29 MED ORDER — HEPARIN SOD (PORK) LOCK FLUSH 100 UNIT/ML IV SOLN
500.0000 [IU] | Freq: Once | INTRAVENOUS | Status: AC
  Administered 2019-12-29: 500 [IU] via INTRAVENOUS
  Filled 2019-12-29: qty 5

## 2019-12-29 MED ORDER — SODIUM CHLORIDE 0.9 % IV SOLN
300.0000 mg | Freq: Once | INTRAVENOUS | Status: AC
  Administered 2019-12-29: 300 mg via INTRAVENOUS
  Filled 2019-12-29: qty 30

## 2019-12-30 ENCOUNTER — Ambulatory Visit
Admission: RE | Admit: 2019-12-30 | Discharge: 2019-12-30 | Disposition: A | Discharge status: Home / Self Care | Payer: Medicare Other | Source: Ambulatory Visit | Attending: Radiation Oncology | Admitting: Radiation Oncology

## 2019-12-30 DIAGNOSIS — C3432 Malignant neoplasm of lower lobe, left bronchus or lung: Secondary | ICD-10-CM | POA: Diagnosis not present

## 2019-12-30 DIAGNOSIS — Z510 Encounter for antineoplastic radiation therapy: Secondary | ICD-10-CM | POA: Diagnosis not present

## 2019-12-31 ENCOUNTER — Ambulatory Visit
Admission: RE | Admit: 2019-12-31 | Discharge: 2019-12-31 | Disposition: A | Discharge status: Home / Self Care | Payer: Medicare Other | Source: Ambulatory Visit | Attending: Radiation Oncology | Admitting: Radiation Oncology

## 2019-12-31 DIAGNOSIS — Z510 Encounter for antineoplastic radiation therapy: Secondary | ICD-10-CM | POA: Diagnosis not present

## 2019-12-31 DIAGNOSIS — C771 Secondary and unspecified malignant neoplasm of intrathoracic lymph nodes: Secondary | ICD-10-CM | POA: Diagnosis not present

## 2019-12-31 DIAGNOSIS — C3432 Malignant neoplasm of lower lobe, left bronchus or lung: Secondary | ICD-10-CM | POA: Diagnosis not present

## 2020-01-03 ENCOUNTER — Ambulatory Visit
Admission: RE | Admit: 2020-01-03 | Discharge: 2020-01-03 | Disposition: A | Discharge status: Home / Self Care | Payer: Medicare Other | Source: Ambulatory Visit | Attending: Radiation Oncology | Admitting: Radiation Oncology

## 2020-01-03 DIAGNOSIS — Z510 Encounter for antineoplastic radiation therapy: Secondary | ICD-10-CM | POA: Diagnosis not present

## 2020-01-03 DIAGNOSIS — C3432 Malignant neoplasm of lower lobe, left bronchus or lung: Secondary | ICD-10-CM | POA: Insufficient documentation

## 2020-01-04 ENCOUNTER — Ambulatory Visit
Admission: RE | Admit: 2020-01-04 | Discharge: 2020-01-04 | Disposition: A | Discharge status: Home / Self Care | Payer: Medicare Other | Source: Ambulatory Visit | Attending: Radiation Oncology | Admitting: Radiation Oncology

## 2020-01-04 DIAGNOSIS — C3432 Malignant neoplasm of lower lobe, left bronchus or lung: Secondary | ICD-10-CM | POA: Diagnosis not present

## 2020-01-04 DIAGNOSIS — Z510 Encounter for antineoplastic radiation therapy: Secondary | ICD-10-CM | POA: Diagnosis not present

## 2020-01-05 ENCOUNTER — Telehealth: Payer: Self-pay | Admitting: Oncology

## 2020-01-05 ENCOUNTER — Inpatient Hospital Stay: Payer: Medicare Other

## 2020-01-05 ENCOUNTER — Ambulatory Visit: Payer: Medicare Other | Admitting: Oncology

## 2020-01-05 ENCOUNTER — Ambulatory Visit: Payer: Self-pay | Admitting: General Practice

## 2020-01-05 ENCOUNTER — Inpatient Hospital Stay: Payer: Medicare Other | Attending: Oncology

## 2020-01-05 ENCOUNTER — Ambulatory Visit
Admission: RE | Admit: 2020-01-05 | Discharge: 2020-01-05 | Disposition: A | Discharge status: Home / Self Care | Payer: Medicare Other | Source: Ambulatory Visit | Attending: Radiation Oncology | Admitting: Radiation Oncology

## 2020-01-05 ENCOUNTER — Ambulatory Visit: Payer: Medicare Other

## 2020-01-05 ENCOUNTER — Telehealth: Payer: Medicare Other | Admitting: General Practice

## 2020-01-05 VITALS — BP 120/51 | HR 87 | Temp 96.7°F | Resp 18 | Wt 206.0 lb

## 2020-01-05 DIAGNOSIS — D61818 Other pancytopenia: Secondary | ICD-10-CM | POA: Insufficient documentation

## 2020-01-05 DIAGNOSIS — K219 Gastro-esophageal reflux disease without esophagitis: Secondary | ICD-10-CM | POA: Insufficient documentation

## 2020-01-05 DIAGNOSIS — E1122 Type 2 diabetes mellitus with diabetic chronic kidney disease: Secondary | ICD-10-CM | POA: Insufficient documentation

## 2020-01-05 DIAGNOSIS — F17210 Nicotine dependence, cigarettes, uncomplicated: Secondary | ICD-10-CM | POA: Diagnosis not present

## 2020-01-05 DIAGNOSIS — D72819 Decreased white blood cell count, unspecified: Secondary | ICD-10-CM | POA: Insufficient documentation

## 2020-01-05 DIAGNOSIS — G8929 Other chronic pain: Secondary | ICD-10-CM | POA: Diagnosis not present

## 2020-01-05 DIAGNOSIS — D649 Anemia, unspecified: Secondary | ICD-10-CM | POA: Diagnosis not present

## 2020-01-05 DIAGNOSIS — M1990 Unspecified osteoarthritis, unspecified site: Secondary | ICD-10-CM | POA: Insufficient documentation

## 2020-01-05 DIAGNOSIS — C3492 Malignant neoplasm of unspecified part of left bronchus or lung: Secondary | ICD-10-CM

## 2020-01-05 DIAGNOSIS — Z809 Family history of malignant neoplasm, unspecified: Secondary | ICD-10-CM | POA: Insufficient documentation

## 2020-01-05 DIAGNOSIS — C3432 Malignant neoplasm of lower lobe, left bronchus or lung: Secondary | ICD-10-CM | POA: Diagnosis not present

## 2020-01-05 DIAGNOSIS — Z886 Allergy status to analgesic agent status: Secondary | ICD-10-CM | POA: Diagnosis not present

## 2020-01-05 DIAGNOSIS — R110 Nausea: Secondary | ICD-10-CM | POA: Diagnosis not present

## 2020-01-05 DIAGNOSIS — N181 Chronic kidney disease, stage 1: Secondary | ICD-10-CM | POA: Insufficient documentation

## 2020-01-05 DIAGNOSIS — Z5112 Encounter for antineoplastic immunotherapy: Secondary | ICD-10-CM | POA: Diagnosis not present

## 2020-01-05 DIAGNOSIS — Z833 Family history of diabetes mellitus: Secondary | ICD-10-CM | POA: Diagnosis not present

## 2020-01-05 DIAGNOSIS — Z8249 Family history of ischemic heart disease and other diseases of the circulatory system: Secondary | ICD-10-CM | POA: Insufficient documentation

## 2020-01-05 DIAGNOSIS — E876 Hypokalemia: Secondary | ICD-10-CM | POA: Diagnosis not present

## 2020-01-05 DIAGNOSIS — E1169 Type 2 diabetes mellitus with other specified complication: Secondary | ICD-10-CM

## 2020-01-05 DIAGNOSIS — R7989 Other specified abnormal findings of blood chemistry: Secondary | ICD-10-CM | POA: Diagnosis not present

## 2020-01-05 DIAGNOSIS — Z9049 Acquired absence of other specified parts of digestive tract: Secondary | ICD-10-CM | POA: Insufficient documentation

## 2020-01-05 DIAGNOSIS — Z880 Allergy status to penicillin: Secondary | ICD-10-CM | POA: Insufficient documentation

## 2020-01-05 DIAGNOSIS — E785 Hyperlipidemia, unspecified: Secondary | ICD-10-CM

## 2020-01-05 DIAGNOSIS — Z79899 Other long term (current) drug therapy: Secondary | ICD-10-CM | POA: Insufficient documentation

## 2020-01-05 DIAGNOSIS — Z510 Encounter for antineoplastic radiation therapy: Secondary | ICD-10-CM | POA: Diagnosis not present

## 2020-01-05 DIAGNOSIS — I10 Essential (primary) hypertension: Secondary | ICD-10-CM

## 2020-01-05 LAB — COMPREHENSIVE METABOLIC PANEL
ALT: 42 U/L (ref 0–44)
AST: 43 U/L — ABNORMAL HIGH (ref 15–41)
Albumin: 4.2 g/dL (ref 3.5–5.0)
Alkaline Phosphatase: 117 U/L (ref 38–126)
Anion gap: 9 (ref 5–15)
BUN: 10 mg/dL (ref 6–20)
CO2: 24 mmol/L (ref 22–32)
Calcium: 9.3 mg/dL (ref 8.9–10.3)
Chloride: 106 mmol/L (ref 98–111)
Creatinine, Ser: 0.96 mg/dL (ref 0.61–1.24)
GFR, Estimated: >60 mL/min (ref >60)
Glucose, Bld: 130 mg/dL — ABNORMAL HIGH (ref 70–99)
Potassium: 4.1 mmol/L (ref 3.5–5.1)
Sodium: 139 mmol/L (ref 135–145)
Total Bilirubin: 1.2 mg/dL (ref 0.3–1.2)
Total Protein: 7.3 g/dL (ref 6.5–8.1)

## 2020-01-05 LAB — CBC WITH DIFFERENTIAL/PLATELET
Abs Immature Granulocytes: 0.01 10*3/uL (ref 0.00–0.07)
Basophils Absolute: 0 10*3/uL (ref 0.0–0.1)
Basophils Relative: 1 %
Eosinophils Absolute: 0 10*3/uL (ref 0.0–0.5)
Eosinophils Relative: 1 %
HCT: 30.8 % — ABNORMAL LOW (ref 39.0–52.0)
Hemoglobin: 10.4 g/dL — ABNORMAL LOW (ref 13.0–17.0)
Immature Granulocytes: 0 %
Lymphocytes Relative: 30 %
Lymphs Abs: 0.8 10*3/uL (ref 0.7–4.0)
MCH: 30.7 pg (ref 26.0–34.0)
MCHC: 33.8 g/dL (ref 30.0–36.0)
MCV: 90.9 fL (ref 80.0–100.0)
Monocytes Absolute: 0.3 10*3/uL (ref 0.1–1.0)
Monocytes Relative: 12 %
Neutro Abs: 1.5 10*3/uL — ABNORMAL LOW (ref 1.7–7.7)
Neutrophils Relative %: 56 %
Platelets: 117 10*3/uL — ABNORMAL LOW (ref 150–400)
RBC: 3.39 MIL/uL — ABNORMAL LOW (ref 4.22–5.81)
RDW: 16.2 % — ABNORMAL HIGH (ref 11.5–15.5)
WBC: 2.7 10*3/uL — ABNORMAL LOW (ref 4.0–10.5)
nRBC: 0.7 % — ABNORMAL HIGH (ref 0.0–0.2)

## 2020-01-05 MED ORDER — HEPARIN SOD (PORK) LOCK FLUSH 100 UNIT/ML IV SOLN
INTRAVENOUS | Status: AC
  Filled 2020-01-05: qty 5

## 2020-01-05 MED ORDER — SODIUM CHLORIDE 0.9 % IV SOLN
Freq: Once | INTRAVENOUS | Status: AC
  Filled 2020-01-05: qty 250

## 2020-01-05 MED ORDER — NYSTATIN 100000 UNIT/ML MT SUSP: 5.0000 mL | Freq: Four times a day (QID) | OROMUCOSAL | 0 refills | Status: DC

## 2020-01-05 MED ORDER — HEPARIN SOD (PORK) LOCK FLUSH 100 UNIT/ML IV SOLN
500.0000 [IU] | Freq: Once | INTRAVENOUS | Status: AC | PRN
  Administered 2020-01-05: 500 [IU]
  Filled 2020-01-05: qty 5

## 2020-01-05 MED ORDER — SODIUM CHLORIDE 0.9 % IV SOLN
300.0000 mg | Freq: Once | INTRAVENOUS | Status: AC
  Administered 2020-01-05: 300 mg via INTRAVENOUS
  Filled 2020-01-05: qty 30

## 2020-01-05 MED ORDER — DIPHENHYDRAMINE HCL 50 MG/ML IJ SOLN
25.0000 mg | Freq: Once | INTRAMUSCULAR | Status: AC
  Administered 2020-01-05: 25 mg via INTRAVENOUS
  Filled 2020-01-05: qty 1

## 2020-01-05 MED ORDER — PALONOSETRON HCL INJECTION 0.25 MG/5ML
0.2500 mg | Freq: Once | INTRAVENOUS | Status: AC
  Administered 2020-01-05: 0.25 mg via INTRAVENOUS
  Filled 2020-01-05: qty 5

## 2020-01-05 MED ORDER — SODIUM CHLORIDE 0.9% FLUSH
10.0000 mL | Freq: Once | INTRAVENOUS | Status: AC
  Administered 2020-01-05: 10 mL via INTRAVENOUS
  Filled 2020-01-05: qty 10

## 2020-01-05 MED ORDER — FAMOTIDINE IN NACL 20-0.9 MG/50ML-% IV SOLN
20.0000 mg | Freq: Once | INTRAVENOUS | Status: AC
  Administered 2020-01-05: 20 mg via INTRAVENOUS
  Filled 2020-01-05: qty 50

## 2020-01-05 MED ORDER — SODIUM CHLORIDE 0.9 % IV SOLN
10.0000 mg | Freq: Once | INTRAVENOUS | Status: AC
  Administered 2020-01-05: 10 mg via INTRAVENOUS
  Filled 2020-01-05: qty 10

## 2020-01-05 MED ORDER — SODIUM CHLORIDE 0.9 % IV SOLN
45.0000 mg/m2 | Freq: Once | INTRAVENOUS | Status: AC
  Administered 2020-01-05: 102 mg via INTRAVENOUS
  Filled 2020-01-05: qty 17

## 2020-01-05 NOTE — Chronic Care Management (AMB) (Signed)
Chronic Care Management   Follow Up Note   01/05/2020 Name: Kristopher [name] MRN: [id number] DOB: 1960-05-24  Referred by: [name], DO Reason for referral : Chronic Care Management (RNCM: Chronic Disease Management and Care Coordination Needs)   Kristopher [name] is a 59 y.o. year old male who is a primary care patient of [name], DO. The CCM team was consulted for assistance with chronic disease management and care coordination needs.    Review of patient status, including review of consultants reports, relevant laboratory and other test results, and collaboration with appropriate care team members and the patient's provider was performed as part of comprehensive patient evaluation and provision of chronic care management services.    SDOH (Social Determinants of Health) assessments performed: Yes See Care Plan activities for detailed interventions related to [facility])     Outpatient Encounter Medications as of 01/05/2020  Medication Sig Note  . Accu-Chek FastClix Lancets MISC USE TO TEST BLOOD SUGAR 2X A DAY   . ACCU-CHEK GUIDE test strip USE TO TEST BLOOD SUGAR 2X A DAY   . amLODipine (NORVASC) 5 MG tablet Take 1 tablet (5 mg total) by mouth 2 (two) times daily.   . ARNUITY ELLIPTA 100 MCG/ACT AEPB Inhale 1 puff into the lungs daily.    [name] atorvastatin (LIPITOR) 10 MG tablet Take 1 tablet (10 mg total) by mouth daily at 6 PM.   . BREZTRI AEROSPHERE 160-9-4.8 MCG/ACT AERO Inhale 2 puffs into the lungs 2 (two) times daily. Maintenance per pulmonology   . diphenhydrAMINE (BENADRYL) 25 MG tablet Take 50 mg by mouth daily.   . Dulaglutide (TRULICITY) 1.5 LK/4.4WN SOPN Inject 1.5 mg into the skin every Wednesday.   . empagliflozin (JARDIANCE) 25 MG TABS tablet Take 1 tablet (25 mg total) by mouth daily before breakfast.   . esomeprazole (NEXIUM) 40 MG capsule Take 1 capsule (40 mg total) by mouth daily.   . hydrALAZINE (APRESOLINE) 100 MG tablet Take 1 tablet (100 mg total) by  mouth 2 (two) times daily.   [name] lidocaine (LIDODERM) 5 % Place 1 patch onto the skin daily. Remove & Discard patch within 12 hours or as directed by MD (Patient taking differently: Place 1 patch onto the skin daily as needed (pain). Remove & Discard patch within 12 hours or as directed by MD)   . lidocaine-prilocaine (EMLA) cream Apply to affected area once (Patient taking differently: Apply 1 application topically daily as needed (port access). ) 11/09/2019: Hasnt started  . metFORMIN (GLUCOPHAGE) 500 MG tablet Take 2 tablets (1,000 mg total) by mouth 2 (two) times daily with a meal.   . metoprolol tartrate (LOPRESSOR) 50 MG tablet Take 50 mg by mouth 2 (two) times daily.   . montelukast (SINGULAIR) 10 MG tablet Take 10 mg by mouth daily.   [name] morphine (MSIR) 15 MG tablet Take 1 tablet (15 mg total) by mouth every 6 (six) hours as needed for moderate pain or severe pain. Must last 30 days.   Derrill Memo ON 01/25/2020] morphine (MSIR) 15 MG tablet Take 1 tablet (15 mg total) by mouth every 6 (six) hours as needed for moderate pain or severe pain. Must last 30 days.   . Multiple Vitamin (MULTIVITAMIN WITH MINERALS) TABS tablet Take 1 tablet by mouth daily.   Derrill Memo ON 01/11/2020] NONFORMULARY OR COMPOUNDED ITEM Apply 1-2 mLs topically 4 (four) times daily as needed. 10% Ketamine/2% Cyclobenzaprine/6% Gabapentin Cream   . nortriptyline (PAMELOR) 25 MG capsule  TAKE 2 CAPSULES (50 MG TOTAL) BY MOUTH AT BEDTIME.   [name] ondansetron (ZOFRAN) 8 MG tablet Take 1 tablet (8 mg total) by mouth 2 (two) times daily as needed for refractory nausea / vomiting.   [name] oxyCODONE (OXY IR/ROXICODONE) 5 MG immediate release tablet Take 1 tablet (5 mg total) by mouth every 4 (four) hours as needed for moderate pain or severe pain.   . pantoprazole (PROTONIX) 40 MG tablet Take 1 tablet (40 mg total) by mouth daily.   . Potassium Chloride ER 20 MEQ TBCR TAKE 1 TABLET BY MOUTH EVERY DAY (Patient taking differently: Take 20 mEq by mouth  daily. )   . prochlorperazine (COMPAZINE) 10 MG tablet Take 1 tablet (10 mg total) by mouth every 6 (six) hours as needed (Nausea or vomiting).   . STIOLTO RESPIMAT 2.5-2.5 MCG/ACT AERS Inhale 2 puffs into the lungs daily. Prn per pulmonology notes   . sucralfate (CARAFATE) 1 g tablet Take 1 tablet (1 g total) by mouth 3 (three) times daily. Dissolve in 3-4 tbsp warm water, swish and swallow   . SUMAtriptan (IMITREX) 50 MG tablet Take 1 tablet (50 mg total) by mouth every 2 (two) hours as needed for migraine. Take 1 tab at onset of migraine. May repeat in 2 hours if headache persists or recurs.    Facility-Administered Encounter Medications as of 01/05/2020  Medication  . 0.9 %  sodium chloride infusion     Objective:   Goals Addressed              This Visit's Progress   .  RNCM- I need to stay healthy for my granddaughter (pt-stated)        Current Barriers:  [name] Knowledge Deficits related to basic understanding of hypertension, diabetes,  and hyperlipidemia  pathophysiology and self care management . Knowledge Deficits related to understanding of medications prescribed for management of hypertension and hyperlipidemia  . Patient continues to smoke 0.5 packs a day- patient has started back since his surgery and has patches. Knows he needs to quit completely.  Is down to about 3 cigarettes a day . Exercise program limited by need of a new knee brace- the patient is more active- completed . New lung cancer diagnosis- surgery with removal of lung cancer- doing well post surgery. Does not need chemo or radiation.  09-15-2019: The patient has a new area detected. Is waiting for follow up for radiation treatment.  11-17-2019: Starting radiation and chemotherapy on 11-18-2019.  01-05-2020: The patient has had 38 radiation treatments and 8 chemo treatments. Has 2 more radiation treatments and will have another treatment x 12 months.  Will be re-scanned in one month.   Case Manager Clinical Goal(s):    [name] Over the next 120 days, patient will verbalize understanding of plan for diabetes management, hypertension management, HLD management and management of chronic disease processes   Interventions:  . Discussed plans with patient for ongoing care management follow up and provided patient with direct contact information for care management team   . Evaluation of Heart healthy/ADA diet. The patient is drinking sodas, does not like diet. Education on the effects of foods high in salt and sugar in a patient with HTN and DM.  The patient states he is doing better with his diet. He is eating a lot of oranges. Education on hidden sugars and sodium in foods. The patient is eating "a lot" of salads.  The patient wants to keep his hemoglobin A1C down. The reading  in February was 7.2, ^ from 5.8 in August of 2020.  Education on healthy food choices. The patient knows that corn and carrots have negative impact on blood sugars. The last couple of days readings are 135 and 140. 01-05-2020: The patient states his blood sugars are in range and denies any episodes of hypoglycemia.  . Education on the body taking longer to heal when having procedures and teeth pulled due to his DM, HTN and smoking does not help with quick healing processes and increases risk of infection.  . Review of blood pressure readings. The last reading was 120/94 Encouraged to monitor blood pressure and discuss treatment options with pcp.  The patient says he is having a hard time with a dry mouth especially at night. Education on medications for HTN may be the reason for dry mouth. Will discuss with the pharmacist for review. 01-05-2020: Blood pressures are stable at this time. Will continue to monitor.   . Education of the benefits of smoking cessation and working with the patient to reach a goal of quitting.  The patient is down to 3 cigarettes a day.  The patient is using patches but not smoking with the patches on, does not like the taste of the  nicotine gum.  01-05-2020: The patient continues to smoke. The patient says he is trying to quit. Will continue to work with the patient on smoking cessation.  . Review of exercise goals. The patient is walking his drive way daily and pushing himself to go further each day. The patient feels better but admits he knows when he has overdone it. He wants to continue to improve his health. His 85 year old granddaughter enjoys going to [facility] and he is learning how to effectively pace his activity.   . Review of upcoming appointments. Sees Dr. Wynetta Emery on 02-28-2020 at 0340 pm.   The patient denies any needs from [facility] team pharmacist and LCSW at this time. Knows assistance is available.    Patient Self Care Activities:  . UNABLE to independently manage HTN as evidenced by reported headaches and high b/p numbers.  . Checks BP and records as discussed . Unable to follow a hearth healthy/ADA diet  . Unable to maintain stable blood sugar readings as evidence of hemoglobin A1C of 7.2  Please see past updates related to this goal by clicking on the "Past Updates" button in the selected goal       .  RNCM: pt-"the cancer is back" (pt-stated)        CARE PLAN ENTRY (see longitudinal plan of care for additional care plan information)  Current Barriers:  [name] Knowledge Deficits related to reoccurrence of squamous cell carcinoma of left lung . Care Coordination needs related to radiation treatments  in a patient with reoccurrence of squamous cell carcinoma of left lung (disease states) . Chronic Disease Management support and education needs related to squamous cell carcinoma of the lung.  Starts radiation and chemotherapy on 11-18-2019. Port a cath placed on 11-11-2019. 01-05-2020: The patient is actively receiving treatments at this time.   Nurse Case Manager Clinical Goal(s):  [name] Over the next 120 days, patient will verbalize understanding of plan for treatment of squamous cell carcinoma of left lung . Over the  next 120 days, patient will work with RNCM, pcp and specialist  to address needs related to reoccurrence of squamous cell carcinoma  . Over the next 120 days, patient will demonstrate a decrease in reoccurrence of cancer cells in lung  exacerbations as evidenced by completion of radiation and resolution of squamous cell carcinoma . Over the next 120 days, patient will attend all scheduled medical appointments: appointment with pcp on 11-23-2019 and is waiting for specialist to call to start radiation therapy. Starts radiation and chemotherapy on 11-18-2019. 01-05-2020: Has completed 38 radiation treatments and 8 chemo treatments. Has 2 more radiation treatments and then will have additional treatments depending on re-evaluation.  . Over the next 120 days, patient will demonstrate improved adherence to prescribed treatment plan for lung cancer as evidenced bycompletion of radiation and remission of lung cancer  Interventions:  . Inter-disciplinary care team collaboration (see longitudinal plan of care) . Evaluation of current treatment plan related to squamous cell carcinoma of left lung and patient's adherence to plan as established by provider. The patient received his port a cath on 11-11-2019. He has had educational class and will start radiation and chemotherapy on 01-05-2020.  The patient is optimistic and feels good about the prognosis of his lung cancer. He has done well with treatments and has had minimal side effects from the radiation and chemo. The patient has lost about 38 pounds.  He has started drinking supplements now.  Will provide him with a protein rich recipe by mail.   . Advised patient to contact the specialist for any acute changes in his condition and to follow the recommendations of the oncologist during radiation and chemotherapy.  . Provided education to patient re: reoccurrence of lung cancer and taking radiation/chemotherapy.  The patient is optimistic and says the doctors on top of  things. The patient told them to cut it out. He did not have to take chemo or radiation last time. He says it is a small area and they are optimistic of success with the treatment regimen with radiation and chemotherapy.   . Reviewed medications with patient and discussed compliance . Discussed plans with patient for ongoing care management follow up and provided patient with direct contact information for care management team . Provided patient and/or caregiver with community resources and care guides for help and  information about resources available in the community if needed. No expressed needs at this time but the patient will call for changes.  Advice worker). . Reviewed scheduled/upcoming provider appointments including: appointment with pcp on 11-23-2019 at 0900 am and has several upcoming appointments to see oncologist and have treatments.   Patient Self Care Activities:  . Patient verbalizes understanding of plan to treat new reoccurrence of squamous cell carcinoma in left lung . Attends all scheduled provider appointments . Calls provider office for new concerns or questions . Unable to independently manage squamous cell carcinoma of left lung. No longer in remission.   Please see past updates related to this goal by clicking on the "Past Updates" button in the selected goal          Plan:   Telephone follow up appointment with care management team member scheduled for: 03-01-2020 at 3:30 pm   [place], MSN, [facility] Mobile: [phone]

## 2020-01-05 NOTE — Progress Notes (Signed)
WBC 2.7, ANC 1.5 (within treatment parameters). pt weight has decreased from 215lbs 4.8oz on 12/29/19 to 206lbs today. pt reports difficulty swallowing and only eats soft foods and broths, pt does drink pedilyte to help with Hydration. He states he has not tried Ensures/boost. Samples and coupons for Ensure/Boost given to pt, and pt verbalizes that he will add those to his diet. Faythe Casa NP aware, okay to proceed with treatment, dietary to be consulted by NP.   approx. 1145: Jenny NP at chairside to speak with pt.   2426: Pt tolerated treatment well. No s/s of distress or reaction noted. Pt stable at discharge.

## 2020-01-05 NOTE — Telephone Encounter (Signed)
Re: dysphagia  Patient complaining of trouble swallowing during his infusion today.  NP asked to evaluate.    [name] is a 59 year old male with past medical history significant for migraines, aortic arthrosclerosis, OSA, rectal polyp, type 2 diabetes, hyperlipidemia and squamous cell carcinoma of the left lung.  He is followed by Dr. Grayland Ormond and Dr. Donella Stade and receiving concurrent chemoradiation with carbo/Taxol.  He has complained of dysphagia in the past and was given a prescription for Magic mouthwash.  Symptoms improved initially but have since worsened.  Patient states he has discontinued his Magic mouthwash secondary to feeling "funny".  He was given a prescription for Carafate by Dr. Donella Stade which he states is not helping.  He denies any fevers or recent illness.  He is able to drink liquids such as Pedialyte and Gatorade.  He also has been able to consume soups and oatmeal without issue.  On assessment today, patient does not have any stomatitis or candidiasis.  Dysphagia likely secondary to radiation.  I have recommended he continue the Carafate as recommended by Dr. Donella Stade.  Given he cannot tolerate the Magic mouthwash with the lidocaine will prescribe him nystatin liquid for possible esophagitis.  He can also try Healios that he can buy on [employer].   Patient to call clinic if symptoms worsen.  Faythe Casa, NP 01/05/2020 3:55 PM

## 2020-01-05 NOTE — Patient Instructions (Signed)
Visit Information  Goals Addressed              This Visit's Progress   .  RNCM- I need to stay healthy for my granddaughter (pt-stated)        Current Barriers:  [name] Knowledge Deficits related to basic understanding of hypertension, diabetes,  and hyperlipidemia  pathophysiology and self care management . Knowledge Deficits related to understanding of medications prescribed for management of hypertension and hyperlipidemia  . Patient continues to smoke 0.5 packs a day- patient has started back since his surgery and has patches. Knows he needs to quit completely.  Is down to about 3 cigarettes a day . Exercise program limited by need of a new knee brace- the patient is more active- completed . New lung cancer diagnosis- surgery with removal of lung cancer- doing well post surgery. Does not need chemo or radiation.  09-15-2019: The patient has a new area detected. Is waiting for follow up for radiation treatment.  11-17-2019: Starting radiation and chemotherapy on 11-18-2019.  01-05-2020: The patient has had 38 radiation treatments and 8 chemo treatments. Has 2 more radiation treatments and will have another treatment x 12 months.  Will be re-scanned in one month.   Case Manager Clinical Goal(s):  [name] Over the next 120 days, patient will verbalize understanding of plan for diabetes management, hypertension management, HLD management and management of chronic disease processes   Interventions:  . Discussed plans with patient for ongoing care management follow up and provided patient with direct contact information for care management team   . Evaluation of Heart healthy/ADA diet. The patient is drinking sodas, does not like diet. Education on the effects of foods high in salt and sugar in a patient with HTN and DM.  The patient states he is doing better with his diet. He is eating a lot of oranges. Education on hidden sugars and sodium in foods. The patient is eating "a lot" of salads.  The patient wants  to keep his hemoglobin A1C down. The reading in February was 7.2, ^ from 5.8 in August of 2020.  Education on healthy food choices. The patient knows that corn and carrots have negative impact on blood sugars. The last couple of days readings are 135 and 140. 01-05-2020: The patient states his blood sugars are in range and denies any episodes of hypoglycemia.  . Education on the body taking longer to heal when having procedures and teeth pulled due to his DM, HTN and smoking does not help with quick healing processes and increases risk of infection.  . Review of blood pressure readings. The last reading was 120/94 Encouraged to monitor blood pressure and discuss treatment options with pcp.  The patient says he is having a hard time with a dry mouth especially at night. Education on medications for HTN may be the reason for dry mouth. Will discuss with the pharmacist for review. 01-05-2020: Blood pressures are stable at this time. Will continue to monitor.   . Education of the benefits of smoking cessation and working with the patient to reach a goal of quitting.  The patient is down to 3 cigarettes a day.  The patient is using patches but not smoking with the patches on, does not like the taste of the nicotine gum.  01-05-2020: The patient continues to smoke. The patient says he is trying to quit. Will continue to work with the patient on smoking cessation.  . Review of exercise goals. The patient is walking  his drive way daily and pushing himself to go further each day. The patient feels better but admits he knows when he has overdone it. He wants to continue to improve his health. His 19 year old granddaughter enjoys going to [facility] and he is learning how to effectively pace his activity.   . Review of upcoming appointments. Sees Dr. Wynetta Emery on 02-28-2020 at 0340 pm.   The patient denies any needs from [facility] team pharmacist and LCSW at this time. Knows assistance is available.    Patient Self Care Activities:    . UNABLE to independently manage HTN as evidenced by reported headaches and high b/p numbers.  . Checks BP and records as discussed . Unable to follow a hearth healthy/ADA diet  . Unable to maintain stable blood sugar readings as evidence of hemoglobin A1C of 7.2  Please see past updates related to this goal by clicking on the "Past Updates" button in the selected goal       .  RNCM: pt-"the cancer is back" (pt-stated)        CARE PLAN ENTRY (see longitudinal plan of care for additional care plan information)  Current Barriers:  [name] Knowledge Deficits related to reoccurrence of squamous cell carcinoma of left lung . Care Coordination needs related to radiation treatments  in a patient with reoccurrence of squamous cell carcinoma of left lung (disease states) . Chronic Disease Management support and education needs related to squamous cell carcinoma of the lung.  Starts radiation and chemotherapy on 11-18-2019. Port a cath placed on 11-11-2019. 01-05-2020: The patient is actively receiving treatments at this time.   Nurse Case Manager Clinical Goal(s):  [name] Over the next 120 days, patient will verbalize understanding of plan for treatment of squamous cell carcinoma of left lung . Over the next 120 days, patient will work with RNCM, pcp and specialist  to address needs related to reoccurrence of squamous cell carcinoma  . Over the next 120 days, patient will demonstrate a decrease in reoccurrence of cancer cells in lung exacerbations as evidenced by completion of radiation and resolution of squamous cell carcinoma . Over the next 120 days, patient will attend all scheduled medical appointments: appointment with pcp on 11-23-2019 and is waiting for specialist to call to start radiation therapy. Starts radiation and chemotherapy on 11-18-2019. 01-05-2020: Has completed 38 radiation treatments and 8 chemo treatments. Has 2 more radiation treatments and then will have additional treatments depending on  re-evaluation.  . Over the next 120 days, patient will demonstrate improved adherence to prescribed treatment plan for lung cancer as evidenced bycompletion of radiation and remission of lung cancer  Interventions:  . Inter-disciplinary care team collaboration (see longitudinal plan of care) . Evaluation of current treatment plan related to squamous cell carcinoma of left lung and patient's adherence to plan as established by provider. The patient received his port a cath on 11-11-2019. He has had educational class and will start radiation and chemotherapy on 01-05-2020.  The patient is optimistic and feels good about the prognosis of his lung cancer. He has done well with treatments and has had minimal side effects from the radiation and chemo. The patient has lost about 38 pounds.  He has started drinking supplements now.  Will provide him with a protein rich recipe by mail.   . Advised patient to contact the specialist for any acute changes in his condition and to follow the recommendations of the oncologist during radiation and chemotherapy.  . Provided education to  patient re: reoccurrence of lung cancer and taking radiation/chemotherapy.  The patient is optimistic and says the doctors on top of things. The patient told them to cut it out. He did not have to take chemo or radiation last time. He says it is a small area and they are optimistic of success with the treatment regimen with radiation and chemotherapy.   . Reviewed medications with patient and discussed compliance . Discussed plans with patient for ongoing care management follow up and provided patient with direct contact information for care management team . Provided patient and/or caregiver with community resources and care guides for help and  information about resources available in the community if needed. No expressed needs at this time but the patient will call for changes.  Advice worker). . Reviewed scheduled/upcoming provider  appointments including: appointment with pcp on 11-23-2019 at 0900 am and has several upcoming appointments to see oncologist and have treatments.   Patient Self Care Activities:  . Patient verbalizes understanding of plan to treat new reoccurrence of squamous cell carcinoma in left lung . Attends all scheduled provider appointments . Calls provider office for new concerns or questions . Unable to independently manage squamous cell carcinoma of left lung. No longer in remission.   Please see past updates related to this goal by clicking on the "Past Updates" button in the selected goal         Patient verbalizes understanding of instructions provided today.   Telephone follow up appointment with care management team member scheduled for: 03-01-2020 at 3:30 pm  Noreene Larsson RN, MSN, [facility] Mobile: [phone]

## 2020-01-06 ENCOUNTER — Ambulatory Visit
Admission: RE | Admit: 2020-01-06 | Discharge: 2020-01-06 | Disposition: A | Discharge status: Home / Self Care | Payer: Medicare Other | Source: Ambulatory Visit | Attending: Radiation Oncology | Admitting: Radiation Oncology

## 2020-01-06 ENCOUNTER — Other Ambulatory Visit: Payer: Self-pay | Admitting: Oncology

## 2020-01-06 DIAGNOSIS — C3432 Malignant neoplasm of lower lobe, left bronchus or lung: Secondary | ICD-10-CM | POA: Diagnosis not present

## 2020-01-06 DIAGNOSIS — Z510 Encounter for antineoplastic radiation therapy: Secondary | ICD-10-CM | POA: Diagnosis not present

## 2020-01-06 NOTE — Progress Notes (Signed)
DISCONTINUE ON PATHWAY REGIMEN - Non-Small Cell Lung     Administer weekly:     Paclitaxel      Carboplatin   **Always confirm dose/schedule in your pharmacy ordering system**  REASON: Continuation Of Treatment PRIOR TREATMENT: [id number]: Carboplatin AUC=2 + Paclitaxel 45 mg/m2 Weekly During Radiation TREATMENT RESPONSE: Partial Response (PR)  START ON PATHWAY REGIMEN - Non-Small Cell Lung     A cycle is every 14 days:     Durvalumab   **Always confirm dose/schedule in your pharmacy ordering system**  Patient Characteristics: Preoperative or Nonsurgical Candidate (Clinical Staging), Stage III - Nonsurgical Candidate (Nonsquamous and Squamous), PS = 0, 1 Therapeutic Status: Preoperative or Nonsurgical Candidate (Clinical Staging) AJCC T Category: cT1b AJCC N Category: cN2 AJCC M Category: cM0 AJCC 8 Stage Grouping: IIIA ECOG Performance Status: 0 Intent of Therapy: Curative Intent, Discussed with Patient

## 2020-01-07 ENCOUNTER — Other Ambulatory Visit: Payer: Self-pay | Admitting: Oncology

## 2020-01-07 ENCOUNTER — Ambulatory Visit
Admission: RE | Admit: 2020-01-07 | Discharge: 2020-01-07 | Disposition: A | Discharge status: Home / Self Care | Payer: Medicare Other | Source: Ambulatory Visit | Attending: Radiation Oncology | Admitting: Radiation Oncology

## 2020-01-07 DIAGNOSIS — C3432 Malignant neoplasm of lower lobe, left bronchus or lung: Secondary | ICD-10-CM | POA: Diagnosis not present

## 2020-01-07 DIAGNOSIS — Z510 Encounter for antineoplastic radiation therapy: Secondary | ICD-10-CM | POA: Diagnosis not present

## 2020-01-07 DIAGNOSIS — C3492 Malignant neoplasm of unspecified part of left bronchus or lung: Secondary | ICD-10-CM

## 2020-01-07 DIAGNOSIS — C771 Secondary and unspecified malignant neoplasm of intrathoracic lymph nodes: Secondary | ICD-10-CM | POA: Diagnosis not present

## 2020-01-10 DIAGNOSIS — C3492 Malignant neoplasm of unspecified part of left bronchus or lung: Secondary | ICD-10-CM | POA: Diagnosis not present

## 2020-01-10 DIAGNOSIS — Z01818 Encounter for other preprocedural examination: Secondary | ICD-10-CM | POA: Diagnosis not present

## 2020-01-11 DIAGNOSIS — F17210 Nicotine dependence, cigarettes, uncomplicated: Secondary | ICD-10-CM | POA: Diagnosis not present

## 2020-01-11 DIAGNOSIS — C3412 Malignant neoplasm of upper lobe, left bronchus or lung: Secondary | ICD-10-CM | POA: Diagnosis not present

## 2020-01-15 NOTE — Progress Notes (Signed)
[place]  Telephone:([phone]) [phone] Fax:(336) [phone]  ID: Kristopher [name] OB: 11-May-1960  MR#: [id number]  TOI#:[id number]  Patient Care Team: [name], DO as PCP - General (Family Medicine) Gavin Pound, CMA (Inactive) as Certified Medical Assistant Telford Nab, RN as Registered Nurse Vanita Ingles, RN as Case Manager (General Practice) [name], MD as Referring Physician (Pain Medicine) [name], MD as Consulting Physician (Oncology)  CHIEF COMPLAINT: Clinical stage IIIa squamous cell 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  INTERVAL HISTORY: Patient returns to clinic today for further evaluation and initiation of cycle one of maintenance durvalumab. He does not complain of dysphagia today. He currently feels well and is asymptomatic.  He has a good appetite and denies weight loss.  He has no neurologic complaints.  He denies any recent fevers or illnesses.  He has no chest pain, shortness of breath, cough, or hemoptysis.  He denies any nausea, vomiting, constipation, or diarrhea.  He has no urinary complaints. Patient offers no specific complaints today.  REVIEW OF SYSTEMS:   Review of Systems  Constitutional: Negative.  Negative for fever, malaise/fatigue and weight loss.  Respiratory: Negative.  Negative for cough and shortness of breath.   Cardiovascular: Negative.  Negative for chest pain and leg swelling.  Gastrointestinal: Negative.  Negative for abdominal pain.  Genitourinary: Negative.  Negative for dysuria and flank pain.  Musculoskeletal: Negative.  Negative for back pain.  Skin: Negative.  Negative for rash.  Neurological: Negative.  Negative for dizziness, focal weakness, weakness and headaches.  Psychiatric/Behavioral: Negative.  The patient is not nervous/anxious.     As per HPI. Otherwise, a complete review of systems is negative.  PAST MEDICAL HISTORY: Past Medical History:  Diagnosis Date  . Allergy    . Arthritis    left foot  . Benign hypertensive kidney disease   . Chronic back pain    Four rods in back  . Diabetes mellitus, type 2 ([place])   . Dyspnea   . GERD (gastroesophageal reflux disease)   . Hypertension   . Malignant neoplasm of lung ([place])   . Migraines    daily    PAST SURGICAL HISTORY: Past Surgical History:  Procedure Laterality Date  . APPENDECTOMY    . BACK SURGERY    . COLONOSCOPY WITH PROPOFOL N/A 02/19/2016   Procedure: COLONOSCOPY WITH PROPOFOL;  Surgeon: Lucilla Lame, MD;  Location: [place];  Service: Endoscopy;  Laterality: N/A;  . COLONOSCOPY WITH PROPOFOL N/A 01/19/2018   Procedure: COLONOSCOPY WITH PROPOFOL;  Surgeon: Lucilla Lame, MD;  Location: [place];  Service: Endoscopy;  Laterality: N/A;  Diabetic - oral meds  . DG OPERATIVE LEFT HIP ([place] [facility]X)     10/19  . ELECTROMAGNETIC NAVIGATION BROCHOSCOPY Left 11/18/2018   Procedure: ELECTROMAGNETIC NAVIGATION BRONCHOSCOPY;  Surgeon: Tyler Pita, MD;  Location: [facility];  Service: Cardiopulmonary;  Laterality: Left;  . FLEXIBLE BRONCHOSCOPY Bilateral 01/20/2019   Procedure: FLEXIBLE BRONCHOSCOPY;  Surgeon: Ottie Glazier, MD;  Location: [facility];  Service: Thoracic;  Laterality: Bilateral;  . FLEXIBLE BRONCHOSCOPY Bilateral 01/22/2019   Procedure: FLEXIBLE BRONCHOSCOPY;  Surgeon: Ottie Glazier, MD;  Location: [facility];  Service: Thoracic;  Laterality: Bilateral;  . FOOT SURGERY Left    Screws and plates  . JOINT REPLACEMENT Left 12/2017   DR Rudene Christians Hip  . KNEE SURGERY Left    X 2  . LEG SURGERY    . LUNG CANCER SURGERY    .  POLYPECTOMY N/A 02/19/2016   Procedure: POLYPECTOMY;  Surgeon: Lucilla Lame, MD;  Location: [place];  Service: Endoscopy;  Laterality: N/A;  . POLYPECTOMY  01/19/2018   Procedure: POLYPECTOMY;  Surgeon: Lucilla Lame, MD;  Location: [place];  Service: Endoscopy;;  . PORTACATH PLACEMENT N/A 11/11/2019   Procedure: INSERTION  PORT-A-CATH;  Surgeon: Nestor Lewandowsky, MD;  Location: [place] [facility];  Service: General;  Laterality: N/A;  . THORACOTOMY Left 01/14/2019   Procedure: THORACOTOMY MAJOR, LEFT;  Surgeon: Nestor Lewandowsky, MD;  Location: [facility];  Service: General;  Laterality: Left;  . TOTAL HIP ARTHROPLASTY Left 12/02/2017   Procedure: TOTAL HIP ARTHROPLASTY ANTERIOR APPROACH;  Surgeon: Hessie Knows, MD;  Location: [facility];  Service: Orthopedics;  Laterality: Left;  [name] VIDEO BRONCHOSCOPY Left 01/14/2019   Procedure: VIDEO BRONCHOSCOPY WITH FLUORO, LEFT;  Surgeon: Nestor Lewandowsky, MD;  Location: [facility];  Service: General;  Laterality: Left;  [name] VIDEO BRONCHOSCOPY WITH ENDOBRONCHIAL NAVIGATION N/A 10/15/2019   Procedure: VIDEO BRONCHOSCOPY WITH ENDOBRONCHIAL NAVIGATION;  Surgeon: Ottie Glazier, MD;  Location: [facility];  Service: Thoracic;  Laterality: N/A;  . VIDEO BRONCHOSCOPY WITH ENDOBRONCHIAL ULTRASOUND N/A 10/15/2019   Procedure: VIDEO BRONCHOSCOPY WITH ENDOBRONCHIAL ULTRASOUND;  Surgeon: Ottie Glazier, MD;  Location: [facility];  Service: Thoracic;  Laterality: N/A;    FAMILY HISTORY: Family History  Problem Relation Age of Onset  . Cancer Father   . Diabetes Sister   . Thrombosis Sister     ADVANCED DIRECTIVES (Y/N):  N  HEALTH MAINTENANCE: Social History   Tobacco Use  . Smoking status: Current Every Day Smoker    Packs/day: 0.05    Years: 35.00    Pack years: 1.75    Types: Cigarettes    Last attempt to quit: 01/14/2019    Years since quitting: 1.0  . Smokeless tobacco: Never Used  . Tobacco comment: 1 pack last 3 days   Vaping Use  . Vaping Use: Never used  Substance Use Topics  . Alcohol use: No    Alcohol/week: 0.0 standard drinks  . Drug use: Yes    Types: Oxycodone    Comment: prescribed     Colonoscopy:  PAP:  Bone density:  Lipid panel:  Allergies  Allergen Reactions  . Acetaminophen Swelling  . Aspirin Anaphylaxis  . Epinephrine Anaphylaxis    Does not include albuterol    . Novocain [Procaine] Anaphylaxis  . Penicillins Anaphylaxis    Has patient had a PCN reaction causing immediate rash, facial/tongue/throat swelling, SOB or lightheadedness with hypotension: Yes Has patient had a PCN reaction causing severe rash involving mucus membranes or skin necrosis: No Has patient had a PCN reaction that required hospitalization: Yes Has patient had a PCN reaction occurring within the last 10 years: No If all of the above answers are "NO", then may proceed with Cephalosporin use.   . Strawberry Extract Anaphylaxis  . Shellfish Allergy Hives and Nausea And Vomiting    Current Outpatient Medications  Medication Sig Dispense Refill  . Accu-Chek FastClix Lancets MISC USE TO TEST BLOOD SUGAR 2X A DAY 100 each 12  . ACCU-CHEK GUIDE test strip USE TO TEST BLOOD SUGAR 2X A DAY 100 strip 12  . albuterol (VENTOLIN HFA) 108 (90 Base) MCG/ACT inhaler Inhale into the lungs.    [name] amLODipine (NORVASC) 5 MG tablet Take 1 tablet (5 mg total) by mouth 2 (two) times daily. 180 tablet 1  . ARNUITY ELLIPTA 100 MCG/ACT AEPB Inhale 1 puff into the lungs daily.     [name]  atorvastatin (LIPITOR) 10 MG tablet Take 1 tablet (10 mg total) by mouth daily at 6 PM. 90 tablet 1  . BREZTRI AEROSPHERE 160-9-4.8 MCG/ACT AERO Inhale 2 puffs into the lungs 2 (two) times daily. Maintenance per pulmonology    . diphenhydrAMINE (BENADRYL) 25 MG tablet Take 50 mg by mouth daily.    . Dulaglutide (TRULICITY) 1.5 GB/1.5VV SOPN Inject 1.5 mg into the skin every Wednesday. 6 mL 1  . empagliflozin (JARDIANCE) 25 MG TABS tablet Take 1 tablet (25 mg total) by mouth daily before breakfast. 30 tablet 3  . esomeprazole (NEXIUM) 40 MG capsule Take 1 capsule (40 mg total) by mouth daily. 90 capsule 3  . hydrALAZINE (APRESOLINE) 100 MG tablet Take 1 tablet (100 mg total) by mouth 2 (two) times daily. 180 tablet 1  . lidocaine (LIDODERM) 5 % Place 1 patch onto the skin daily. Remove & Discard patch within 12 hours or as  directed by MD (Patient taking differently: Place 1 patch onto the skin daily as needed (pain). Remove & Discard patch within 12 hours or as directed by MD) 30 patch 12  . metFORMIN (GLUCOPHAGE) 500 MG tablet Take 2 tablets (1,000 mg total) by mouth 2 (two) times daily with a meal. 360 tablet 1  . metoprolol tartrate (LOPRESSOR) 50 MG tablet Take 50 mg by mouth 2 (two) times daily.    . montelukast (SINGULAIR) 10 MG tablet Take 10 mg by mouth daily.    [name] morphine (MSIR) 15 MG tablet Take 1 tablet (15 mg total) by mouth every 6 (six) hours as needed for moderate pain or severe pain. Must last 30 days. 120 tablet 0  . [START ON 01/25/2020] morphine (MSIR) 15 MG tablet Take 1 tablet (15 mg total) by mouth every 6 (six) hours as needed for moderate pain or severe pain. Must last 30 days. 120 tablet 0  . Multiple Vitamin (MULTIVITAMIN WITH MINERALS) TABS tablet Take 1 tablet by mouth daily.    . NONFORMULARY OR COMPOUNDED ITEM Apply 1-2 mLs topically 4 (four) times daily as needed. 10% Ketamine/2% Cyclobenzaprine/6% Gabapentin Cream 240 each 12  . nortriptyline (PAMELOR) 25 MG capsule TAKE 2 CAPSULES (50 MG TOTAL) BY MOUTH AT BEDTIME. 180 capsule 1  . nystatin (MYCOSTATIN) 100000 UNIT/ML suspension Take 5 mLs (500,000 Units total) by mouth 4 (four) times daily. 240 mL 0  . ondansetron (ZOFRAN) 8 MG tablet TAKE 1 TABLET (8 MG TOTAL) BY MOUTH 2 (TWO) TIMES DAILY AS NEEDED FOR REFRACTORY NAUSEA / VOMITING. 60 tablet 1  . oxyCODONE (OXY IR/ROXICODONE) 5 MG immediate release tablet Take 1 tablet (5 mg total) by mouth every 4 (four) hours as needed for moderate pain or severe pain. 20 tablet 0  . pantoprazole (PROTONIX) 40 MG tablet Take 1 tablet (40 mg total) by mouth daily. 15 tablet 2  . Potassium Chloride ER 20 MEQ TBCR TAKE 1 TABLET BY MOUTH EVERY DAY (Patient taking differently: Take 20 mEq by mouth daily. ) 90 tablet 1  . prochlorperazine (COMPAZINE) 10 MG tablet TAKE 1 TABLET (10 MG TOTAL) BY MOUTH EVERY  6 (SIX) HOURS AS NEEDED (NAUSEA OR VOMITING). 60 tablet 2  . STIOLTO RESPIMAT 2.5-2.5 MCG/ACT AERS Inhale 2 puffs into the lungs daily. Prn per pulmonology notes    . sucralfate (CARAFATE) 1 g tablet Take 1 tablet (1 g total) by mouth 3 (three) times daily. Dissolve in 3-4 tbsp warm water, swish and swallow 90 tablet 3  . SUMAtriptan (IMITREX) 50 MG tablet  Take 1 tablet (50 mg total) by mouth every 2 (two) hours as needed for migraine. Take 1 tab at onset of migraine. May repeat in 2 hours if headache persists or recurs. 10 tablet 12   No current facility-administered medications for this visit.   Facility-Administered Medications Ordered in Other Visits  Medication Dose Route Frequency Provider Last Rate Last Admin  . 0.9 %  sodium chloride infusion    Continuous PRN Dionne Bucy, CRNA   New Bag at 01/22/19 0705    OBJECTIVE: Vitals:   01/19/20 0942  BP: 127/90  Pulse: 73  Temp: 97.9 F (36.6 C)     Body mass index is 28.7 kg/m.    ECOG FS:0 - Asymptomatic  General: Well-developed, well-nourished, no acute distress. Eyes: Pink conjunctiva, anicteric sclera. HEENT: Normocephalic, moist mucous membranes. Lungs: No audible wheezing or coughing. Heart: Regular rate and rhythm. Abdomen: Soft, nontender, no obvious distention. Musculoskeletal: No edema, cyanosis, or clubbing. Neuro: Alert, answering all questions appropriately. Cranial nerves grossly intact. Skin: No rashes or petechiae noted. Psych: Normal affect.  LAB RESULTS:  Lab Results  Component Value Date   NA 139 01/19/2020   K 4.2 01/19/2020   CL 105 01/19/2020   CO2 24 01/19/2020   GLUCOSE 98 01/19/2020   BUN 8 01/19/2020   CREATININE 0.93 01/19/2020   CALCIUM 9.4 01/19/2020   PROT 7.1 01/19/2020   ALBUMIN 4.1 01/19/2020   AST 38 01/19/2020   ALT 31 01/19/2020   ALKPHOS 135 (H) 01/19/2020   BILITOT 1.0 01/19/2020   GFRNONAA >60 01/19/2020   GFRAA >60 12/01/2019    Lab Results  Component Value Date    WBC 2.8 (L) 01/19/2020   NEUTROABS 1.0 (L) 01/19/2020   HGB 9.7 (L) 01/19/2020   HCT 28.6 (L) 01/19/2020   MCV 92.9 01/19/2020   PLT 205 01/19/2020     STUDIES: No results found.  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  PLAN:    1.  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  He had a complicated postoperative course. CT scan results from August 14, 2019 reviewed independently with a suspicious subcarinal lymph node measuring 1.4 cm.  Follow-up PET scan on August 23, 2019 revealed hypermetabolism in lymph node highly suspicious for recurrence.  Biopsy confirmed recurrence increasing patient's stage from IA up to IIIA. Patient completed concurrent XRT and weekly carboplatinum and Taxol on 01/05/2020. Proceed with 1 year of maintenance immunotherapy using durvalumab. Return to clinic in 2 weeks for further evaluation and consideration of cycle 2. 2.  Pain: Chronic and unrelated to his malignancy.  Continue monitoring and treatment per pain clinic. 3.  Nausea: Patient asked if it were okay to take CBD Gummies for nausea.  From an oncology standpoint this would be okay, but patient did state concern that it would show a positive urine test.  He was instructed to let pain clinic know that he was taking this for oncologic purposes. 4.  Elevated LFTs: Resolved. 5.  Dysphagia: Resolved. Continue Magic mouthwash as needed. 6.  Hypokalemia: Resolved.  Continue oral potassium supplementation. 7.  Leukopenia: Chronic and unchanged. 8. Anemia: Patient's hemoglobin has trended down slightly to 9.7, monitor.   Patient expressed understanding and was in agreement with this plan. He also understands that He can call clinic at any time with any questions, concerns, or complaints.   Cancer Staging Squamous  cell carcinoma of left lung [facility]) Staging form: Lung, AJCC 8th Edition -  Clinical stage from 02/12/2019: Stage IIIA (cT1b, cN2, cM0) - Signed by [name], MD on 11/01/2019   [name], MD   01/22/2020 7:14 AM

## 2020-01-18 ENCOUNTER — Encounter: Payer: Self-pay | Admitting: Oncology

## 2020-01-19 ENCOUNTER — Inpatient Hospital Stay (HOSPITAL_BASED_OUTPATIENT_CLINIC_OR_DEPARTMENT_OTHER): Payer: Medicare Other | Admitting: Oncology

## 2020-01-19 ENCOUNTER — Other Ambulatory Visit: Payer: Self-pay | Admitting: *Deleted

## 2020-01-19 ENCOUNTER — Inpatient Hospital Stay: Payer: Medicare Other

## 2020-01-19 ENCOUNTER — Other Ambulatory Visit: Payer: Self-pay

## 2020-01-19 ENCOUNTER — Encounter: Payer: Self-pay | Admitting: Oncology

## 2020-01-19 VITALS — BP 127/90 | HR 73 | Temp 97.9°F | Wt 200.0 lb

## 2020-01-19 DIAGNOSIS — C3492 Malignant neoplasm of unspecified part of left bronchus or lung: Secondary | ICD-10-CM

## 2020-01-19 DIAGNOSIS — D61818 Other pancytopenia: Secondary | ICD-10-CM | POA: Diagnosis not present

## 2020-01-19 DIAGNOSIS — F17210 Nicotine dependence, cigarettes, uncomplicated: Secondary | ICD-10-CM | POA: Diagnosis not present

## 2020-01-19 DIAGNOSIS — G8929 Other chronic pain: Secondary | ICD-10-CM | POA: Diagnosis not present

## 2020-01-19 DIAGNOSIS — E1122 Type 2 diabetes mellitus with diabetic chronic kidney disease: Secondary | ICD-10-CM | POA: Diagnosis not present

## 2020-01-19 DIAGNOSIS — M1990 Unspecified osteoarthritis, unspecified site: Secondary | ICD-10-CM | POA: Diagnosis not present

## 2020-01-19 DIAGNOSIS — R7989 Other specified abnormal findings of blood chemistry: Secondary | ICD-10-CM | POA: Diagnosis not present

## 2020-01-19 DIAGNOSIS — D72819 Decreased white blood cell count, unspecified: Secondary | ICD-10-CM | POA: Diagnosis not present

## 2020-01-19 DIAGNOSIS — Z5112 Encounter for antineoplastic immunotherapy: Secondary | ICD-10-CM | POA: Diagnosis not present

## 2020-01-19 DIAGNOSIS — Z880 Allergy status to penicillin: Secondary | ICD-10-CM | POA: Diagnosis not present

## 2020-01-19 DIAGNOSIS — C3432 Malignant neoplasm of lower lobe, left bronchus or lung: Secondary | ICD-10-CM | POA: Diagnosis not present

## 2020-01-19 DIAGNOSIS — Z8249 Family history of ischemic heart disease and other diseases of the circulatory system: Secondary | ICD-10-CM | POA: Diagnosis not present

## 2020-01-19 DIAGNOSIS — Z886 Allergy status to analgesic agent status: Secondary | ICD-10-CM | POA: Diagnosis not present

## 2020-01-19 DIAGNOSIS — Z809 Family history of malignant neoplasm, unspecified: Secondary | ICD-10-CM | POA: Diagnosis not present

## 2020-01-19 DIAGNOSIS — N181 Chronic kidney disease, stage 1: Secondary | ICD-10-CM | POA: Diagnosis not present

## 2020-01-19 DIAGNOSIS — Z95828 Presence of other vascular implants and grafts: Secondary | ICD-10-CM

## 2020-01-19 DIAGNOSIS — E876 Hypokalemia: Secondary | ICD-10-CM | POA: Diagnosis not present

## 2020-01-19 DIAGNOSIS — Z9049 Acquired absence of other specified parts of digestive tract: Secondary | ICD-10-CM | POA: Diagnosis not present

## 2020-01-19 DIAGNOSIS — D649 Anemia, unspecified: Secondary | ICD-10-CM | POA: Diagnosis not present

## 2020-01-19 DIAGNOSIS — Z833 Family history of diabetes mellitus: Secondary | ICD-10-CM | POA: Diagnosis not present

## 2020-01-19 DIAGNOSIS — R110 Nausea: Secondary | ICD-10-CM | POA: Diagnosis not present

## 2020-01-19 DIAGNOSIS — K219 Gastro-esophageal reflux disease without esophagitis: Secondary | ICD-10-CM | POA: Diagnosis not present

## 2020-01-19 DIAGNOSIS — Z79899 Other long term (current) drug therapy: Secondary | ICD-10-CM | POA: Diagnosis not present

## 2020-01-19 LAB — COMPREHENSIVE METABOLIC PANEL WITH GFR
ALT: 31 U/L (ref 0–44)
AST: 38 U/L (ref 15–41)
Albumin: 4.1 g/dL (ref 3.5–5.0)
Alkaline Phosphatase: 135 U/L — ABNORMAL HIGH (ref 38–126)
Anion gap: 10 (ref 5–15)
BUN: 8 mg/dL (ref 6–20)
CO2: 24 mmol/L (ref 22–32)
Calcium: 9.4 mg/dL (ref 8.9–10.3)
Chloride: 105 mmol/L (ref 98–111)
Creatinine, Ser: 0.93 mg/dL (ref 0.61–1.24)
GFR, Estimated: >60 mL/min
Glucose, Bld: 98 mg/dL (ref 70–99)
Potassium: 4.2 mmol/L (ref 3.5–5.1)
Sodium: 139 mmol/L (ref 135–145)
Total Bilirubin: 1 mg/dL (ref 0.3–1.2)
Total Protein: 7.1 g/dL (ref 6.5–8.1)

## 2020-01-19 LAB — CBC WITH DIFFERENTIAL/PLATELET
Abs Immature Granulocytes: 0.01 10*3/uL (ref 0.00–0.07)
Basophils Absolute: 0 10*3/uL (ref 0.0–0.1)
Basophils Relative: 1 %
Eosinophils Absolute: 0 10*3/uL (ref 0.0–0.5)
Eosinophils Relative: 0 %
HCT: 28.6 % — ABNORMAL LOW (ref 39.0–52.0)
Hemoglobin: 9.7 g/dL — ABNORMAL LOW (ref 13.0–17.0)
Immature Granulocytes: 0 %
Lymphocytes Relative: 43 %
Lymphs Abs: 1.2 10*3/uL (ref 0.7–4.0)
MCH: 31.5 pg (ref 26.0–34.0)
MCHC: 33.9 g/dL (ref 30.0–36.0)
MCV: 92.9 fL (ref 80.0–100.0)
Monocytes Absolute: 0.5 10*3/uL (ref 0.1–1.0)
Monocytes Relative: 19 %
Neutro Abs: 1 10*3/uL — ABNORMAL LOW (ref 1.7–7.7)
Neutrophils Relative %: 37 %
Platelets: 205 10*3/uL (ref 150–400)
RBC: 3.08 MIL/uL — ABNORMAL LOW (ref 4.22–5.81)
RDW: 18.8 % — ABNORMAL HIGH (ref 11.5–15.5)
WBC: 2.8 10*3/uL — ABNORMAL LOW (ref 4.0–10.5)
nRBC: 0 % (ref 0.0–0.2)

## 2020-01-19 LAB — TSH: TSH: 0.649 u[IU]/mL (ref 0.350–4.500)

## 2020-01-19 MED ORDER — HEPARIN SOD (PORK) LOCK FLUSH 100 UNIT/ML IV SOLN
500.0000 [IU] | Freq: Once | INTRAVENOUS | Status: DC | PRN
  Filled 2020-01-19: qty 5

## 2020-01-19 MED ORDER — SODIUM CHLORIDE 0.9% FLUSH
10.0000 mL | Freq: Once | INTRAVENOUS | Status: AC
  Administered 2020-01-19: 10 mL via INTRAVENOUS
  Filled 2020-01-19: qty 10

## 2020-01-19 MED ORDER — SODIUM CHLORIDE 0.9 % IV SOLN
10.0000 mg/kg | Freq: Once | INTRAVENOUS | Status: AC
  Administered 2020-01-19: 1000 mg via INTRAVENOUS
  Filled 2020-01-19: qty 20

## 2020-01-19 MED ORDER — HEPARIN SOD (PORK) LOCK FLUSH 100 UNIT/ML IV SOLN
500.0000 [IU] | Freq: Once | INTRAVENOUS | Status: AC
  Administered 2020-01-19: 500 [IU] via INTRAVENOUS
  Filled 2020-01-19: qty 5

## 2020-01-19 MED ORDER — SODIUM CHLORIDE 0.9 % IV SOLN
Freq: Once | INTRAVENOUS | Status: AC
  Filled 2020-01-19: qty 250

## 2020-01-19 NOTE — Progress Notes (Signed)
1145: pt tolerated treatment well. No s/s of distress or reaction noted. Pt stable at discharge.

## 2020-01-20 ENCOUNTER — Other Ambulatory Visit: Payer: Self-pay | Admitting: Family Medicine

## 2020-01-20 LAB — T4: T4, Total: 8.8 ug/dL (ref 4.5–12.0)

## 2020-01-20 NOTE — Telephone Encounter (Signed)
Requested medications are due for refill today yes  Requested medications are on the active medication list yes  Last refill 8/19 Notes to clinic Historical Provider

## 2020-01-23 NOTE — Telephone Encounter (Signed)
He has been on 200mg  XR daily. Unclear where this dose came from or why the 200mg  is not on his chart currently.

## 2020-01-23 NOTE — Telephone Encounter (Signed)
This dose is not what we have had him taking- can we find out if someone changed his dose?

## 2020-01-24 ENCOUNTER — Other Ambulatory Visit: Payer: Self-pay | Admitting: Family Medicine

## 2020-01-24 MED ORDER — METOPROLOL TARTRATE 50 MG PO TABS
50.0000 mg | ORAL_TABLET | Freq: Two times a day (BID) | ORAL | 0 refills | Status: DC
Start: 2020-01-24 — End: 2020-04-16

## 2020-01-24 NOTE — Telephone Encounter (Signed)
Called the pharmacy regarding this medication. Patient has been dispensed the 50 mg metoprolol BID for several months now. Last RX we sent in was 08/03/19 for the 100 mg BID but the pharmacy still has this on hold, it was never filled. Called and verified this dosage with the patient as well.

## 2020-01-26 NOTE — Progress Notes (Signed)
[place]  [phone] [phone]) [phone]  ID: Kristopher [name] OB: 1960-11-20  MR#: [id number]  ATF#:[id number]  Patient Care Team: [name], DO as PCP - General (Family Medicine) Gavin Pound, [place] (Inactive) as Certified Medical Assistant Telford Nab, RN as Registered Nurse Vanita Ingles, RN as Case Manager (General Practice) [name], MD as Referring Physician (Pain Medicine) [name], MD as Consulting Physician (Oncology)  CHIEF COMPLAINT: Clinical stage IIIa squamous cell 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  INTERVAL HISTORY: Patient returns to clinic today for further evaluation and consideration of cycle 2 of maintenance durvalumab.  He tolerated his first treatment well without significant side effects.  He currently feels well and is asymptomatic. He has a good appetite and denies weight loss.  He has no neurologic complaints.  He denies any recent fevers or illnesses.  He has no chest pain, shortness of breath, cough, or hemoptysis.  He denies any nausea, vomiting, constipation, or diarrhea.  He has no urinary complaints.  Patient offers no specific complaints today.  REVIEW OF SYSTEMS:   Review of Systems  Constitutional: Negative.  Negative for fever, malaise/fatigue and weight loss.  Respiratory: Negative.  Negative for cough and shortness of breath.   Cardiovascular: Negative.  Negative for chest pain and leg swelling.  Gastrointestinal: Negative.  Negative for abdominal pain.  Genitourinary: Negative.  Negative for dysuria and flank pain.  Musculoskeletal: Negative.  Negative for back pain.  Skin: Negative.  Negative for rash.  Neurological: Negative.  Negative for dizziness, focal weakness, weakness and headaches.  Psychiatric/Behavioral: Negative.  The patient is not nervous/anxious.     As per HPI. Otherwise, a complete review of systems is negative.  PAST MEDICAL HISTORY: Past Medical  History:  Diagnosis Date  . Allergy   . Arthritis    left foot  . Benign hypertensive kidney disease   . Chronic back pain    Four rods in back  . Diabetes mellitus, type 2 ([place])   . Dyspnea   . GERD (gastroesophageal reflux disease)   . Hypertension   . Malignant neoplasm of lung ([place])   . Migraines    daily    PAST SURGICAL HISTORY: Past Surgical History:  Procedure Laterality Date  . APPENDECTOMY    . BACK SURGERY    . COLONOSCOPY WITH PROPOFOL N/A 02/19/2016   Procedure: COLONOSCOPY WITH PROPOFOL;  Surgeon: Lucilla Lame, MD;  Location: [place];  Service: Endoscopy;  Laterality: N/A;  . COLONOSCOPY WITH PROPOFOL N/A 01/19/2018   Procedure: COLONOSCOPY WITH PROPOFOL;  Surgeon: Lucilla Lame, MD;  Location: [place];  Service: Endoscopy;  Laterality: N/A;  Diabetic - oral meds  . DG OPERATIVE LEFT HIP ([place] HX)     10/19  . ELECTROMAGNETIC NAVIGATION BROCHOSCOPY Left 11/18/2018   Procedure: ELECTROMAGNETIC NAVIGATION BRONCHOSCOPY;  Surgeon: Tyler Pita, MD;  Location: [facility];  Service: Cardiopulmonary;  Laterality: Left;  . FLEXIBLE BRONCHOSCOPY Bilateral 01/20/2019   Procedure: FLEXIBLE BRONCHOSCOPY;  Surgeon: Ottie Glazier, MD;  Location: [facility];  Service: Thoracic;  Laterality: Bilateral;  . FLEXIBLE BRONCHOSCOPY Bilateral 01/22/2019   Procedure: FLEXIBLE BRONCHOSCOPY;  Surgeon: Ottie Glazier, MD;  Location: [facility];  Service: Thoracic;  Laterality: Bilateral;  . FOOT SURGERY Left    Screws and plates  . JOINT REPLACEMENT Left 12/2017   DR Rudene Christians Hip  . KNEE SURGERY Left    X 2  . LEG SURGERY    . LUNG CANCER  SURGERY    . POLYPECTOMY N/A 02/19/2016   Procedure: POLYPECTOMY;  Surgeon: Lucilla Lame, MD;  Location: [place];  Service: Endoscopy;  Laterality: N/A;  . POLYPECTOMY  01/19/2018   Procedure: POLYPECTOMY;  Surgeon: Lucilla Lame, MD;  Location: [place];  Service: Endoscopy;;  . PORTACATH PLACEMENT N/A  11/11/2019   Procedure: INSERTION PORT-A-CATH;  Surgeon: Nestor Lewandowsky, MD;  Location: [facility];  Service: General;  Laterality: N/A;  . THORACOTOMY Left 01/14/2019   Procedure: THORACOTOMY MAJOR, LEFT;  Surgeon: Nestor Lewandowsky, MD;  Location: [facility];  Service: General;  Laterality: Left;  . TOTAL HIP ARTHROPLASTY Left 12/02/2017   Procedure: TOTAL HIP ARTHROPLASTY ANTERIOR APPROACH;  Surgeon: Hessie Knows, MD;  Location: [facility];  Service: Orthopedics;  Laterality: Left;  [name] VIDEO BRONCHOSCOPY Left 01/14/2019   Procedure: VIDEO BRONCHOSCOPY WITH FLUORO, LEFT;  Surgeon: Nestor Lewandowsky, MD;  Location: [facility];  Service: General;  Laterality: Left;  [name] VIDEO BRONCHOSCOPY WITH ENDOBRONCHIAL NAVIGATION N/A 10/15/2019   Procedure: VIDEO BRONCHOSCOPY WITH ENDOBRONCHIAL NAVIGATION;  Surgeon: Ottie Glazier, MD;  Location: [facility];  Service: Thoracic;  Laterality: N/A;  . VIDEO BRONCHOSCOPY WITH ENDOBRONCHIAL ULTRASOUND N/A 10/15/2019   Procedure: VIDEO BRONCHOSCOPY WITH ENDOBRONCHIAL ULTRASOUND;  Surgeon: Ottie Glazier, MD;  Location: [facility];  Service: Thoracic;  Laterality: N/A;    FAMILY HISTORY: Family History  Problem Relation Age of Onset  . Cancer Father   . Diabetes Sister   . Thrombosis Sister     ADVANCED DIRECTIVES (Y/N):  N  HEALTH MAINTENANCE: Social History   Tobacco Use  . Smoking status: Current Every Day Smoker    Packs/day: 0.05    Years: 35.00    Pack years: 1.75    Types: Cigarettes    Last attempt to quit: 01/14/2019    Years since quitting: 1.0  . Smokeless tobacco: Never Used  . Tobacco comment: 1 pack last 3 days   Vaping Use  . Vaping Use: Never used  Substance Use Topics  . Alcohol use: No    Alcohol/week: 0.0 standard drinks  . Drug use: Yes    Types: Oxycodone    Comment: prescribed     Colonoscopy:  PAP:  Bone density:  Lipid panel:  Allergies  Allergen Reactions  . Acetaminophen Swelling  . Aspirin Anaphylaxis  . Epinephrine Anaphylaxis     Does not include albuterol  . Novocain [Procaine] Anaphylaxis  . Penicillins Anaphylaxis    Has patient had a PCN reaction causing immediate rash, facial/tongue/throat swelling, SOB or lightheadedness with hypotension: Yes Has patient had a PCN reaction causing severe rash involving mucus membranes or skin necrosis: No Has patient had a PCN reaction that required hospitalization: Yes Has patient had a PCN reaction occurring within the last 10 years: No If all of the above answers are "NO", then may proceed with Cephalosporin use.   . Strawberry Extract Anaphylaxis  . Shellfish Allergy Hives and Nausea And Vomiting    Current Outpatient Medications  Medication Sig Dispense Refill  . Accu-Chek FastClix Lancets MISC USE TO TEST BLOOD SUGAR 2X A DAY 100 each 12  . ACCU-CHEK GUIDE test strip USE TO TEST BLOOD SUGAR 2X A DAY 100 strip 12  . albuterol (VENTOLIN HFA) 108 (90 Base) MCG/ACT inhaler Inhale into the lungs.    [name] amLODipine (NORVASC) 5 MG tablet Take 1 tablet (5 mg total) by mouth 2 (two) times daily. 180 tablet 1  . ARNUITY ELLIPTA 100 MCG/ACT AEPB Inhale 1 puff into  the lungs daily.     [name] atorvastatin (LIPITOR) 10 MG tablet Take 1 tablet (10 mg total) by mouth daily at 6 PM. 90 tablet 1  . BREZTRI AEROSPHERE 160-9-4.8 MCG/ACT AERO Inhale 2 puffs into the lungs 2 (two) times daily. Maintenance per pulmonology    . diphenhydrAMINE (BENADRYL) 25 MG tablet Take 50 mg by mouth daily.    . Dulaglutide (TRULICITY) 1.5 UE/4.5WU SOPN Inject 1.5 mg into the skin every Wednesday. 6 mL 1  . empagliflozin (JARDIANCE) 25 MG TABS tablet Take 1 tablet (25 mg total) by mouth daily before breakfast. 30 tablet 3  . esomeprazole (NEXIUM) 40 MG capsule Take 1 capsule (40 mg total) by mouth daily. 90 capsule 3  . hydrALAZINE (APRESOLINE) 100 MG tablet Take 1 tablet (100 mg total) by mouth 2 (two) times daily. 180 tablet 1  . lidocaine (LIDODERM) 5 % Place 1 patch onto the skin daily. Remove & Discard  patch within 12 hours or as directed by MD (Patient taking differently: Place 1 patch onto the skin daily as needed (pain). Remove & Discard patch within 12 hours or as directed by MD) 30 patch 12  . metFORMIN (GLUCOPHAGE) 500 MG tablet Take 2 tablets (1,000 mg total) by mouth 2 (two) times daily with a meal. 360 tablet 1  . metoprolol tartrate (LOPRESSOR) 50 MG tablet Take 1 tablet (50 mg total) by mouth 2 (two) times daily. 180 tablet 0  . montelukast (SINGULAIR) 10 MG tablet Take 10 mg by mouth daily.    [name] morphine (MSIR) 15 MG tablet Take 1 tablet (15 mg total) by mouth every 6 (six) hours as needed for moderate pain or severe pain. Must last 30 days. 120 tablet 0  . Multiple Vitamin (MULTIVITAMIN WITH MINERALS) TABS tablet Take 1 tablet by mouth daily.    . NONFORMULARY OR COMPOUNDED ITEM Apply 1-2 mLs topically 4 (four) times daily as needed. 10% Ketamine/2% Cyclobenzaprine/6% Gabapentin Cream 240 each 12  . nortriptyline (PAMELOR) 25 MG capsule TAKE 2 CAPSULES (50 MG TOTAL) BY MOUTH AT BEDTIME. 180 capsule 1  . nystatin (MYCOSTATIN) 100000 UNIT/ML suspension Take 5 mLs (500,000 Units total) by mouth 4 (four) times daily. 240 mL 0  . ondansetron (ZOFRAN) 8 MG tablet TAKE 1 TABLET (8 MG TOTAL) BY MOUTH 2 (TWO) TIMES DAILY AS NEEDED FOR REFRACTORY NAUSEA / VOMITING. 60 tablet 1  . oxyCODONE (OXY IR/ROXICODONE) 5 MG immediate release tablet Take 1 tablet (5 mg total) by mouth every 4 (four) hours as needed for moderate pain or severe pain. 20 tablet 0  . pantoprazole (PROTONIX) 40 MG tablet Take 1 tablet (40 mg total) by mouth daily. 15 tablet 2  . Potassium Chloride ER 20 MEQ TBCR TAKE 1 TABLET BY MOUTH EVERY DAY (Patient taking differently: Take 20 mEq by mouth daily. ) 90 tablet 1  . prochlorperazine (COMPAZINE) 10 MG tablet TAKE 1 TABLET (10 MG TOTAL) BY MOUTH EVERY 6 (SIX) HOURS AS NEEDED (NAUSEA OR VOMITING). 60 tablet 2  . STIOLTO RESPIMAT 2.5-2.5 MCG/ACT AERS Inhale 2 puffs into the lungs  daily. Prn per pulmonology notes    . sucralfate (CARAFATE) 1 g tablet Take 1 tablet (1 g total) by mouth 3 (three) times daily. Dissolve in 3-4 tbsp warm water, swish and swallow 90 tablet 3  . SUMAtriptan (IMITREX) 50 MG tablet Take 1 tablet (50 mg total) by mouth every 2 (two) hours as needed for migraine. Take 1 tab at onset of migraine. May repeat  in 2 hours if headache persists or recurs. 10 tablet 12   No current facility-administered medications for this visit.   Facility-Administered Medications Ordered in Other Visits  Medication Dose Route Frequency Provider Last Rate Last Admin  . 0.9 %  sodium chloride infusion    Continuous PRN Dionne Bucy, CRNA   New Bag at 01/22/19 0705    OBJECTIVE: Vitals:   02/02/20 0951  BP: (!) 135/93  Pulse: 71  Resp: 16  Temp: 98.5 F (36.9 C)  SpO2: 99%     Body mass index is 29 kg/m.    ECOG FS:0 - Asymptomatic  General: Well-developed, well-nourished, no acute distress. Eyes: Pink conjunctiva, anicteric sclera. HEENT: Normocephalic, moist mucous membranes. Lungs: No audible wheezing or coughing. Heart: Regular rate and rhythm. Abdomen: Soft, nontender, no obvious distention. Musculoskeletal: No edema, cyanosis, or clubbing. Neuro: Alert, answering all questions appropriately. Cranial nerves grossly intact. Skin: No rashes or petechiae noted. Psych: Normal affect.   LAB RESULTS:  Lab Results  Component Value Date   NA 140 02/02/2020   K 3.9 02/02/2020   CL 106 02/02/2020   CO2 25 02/02/2020   GLUCOSE 128 (H) 02/02/2020   BUN 7 02/02/2020   CREATININE 0.64 02/02/2020   CALCIUM 9.0 02/02/2020   PROT 7.1 02/02/2020   ALBUMIN 3.9 02/02/2020   AST 36 02/02/2020   ALT 24 02/02/2020   ALKPHOS 119 02/02/2020   BILITOT 0.7 02/02/2020   GFRNONAA >60 02/02/2020   GFRAA >60 12/01/2019    Lab Results  Component Value Date   WBC 3.9 (L) 02/02/2020   NEUTROABS 2.0 02/02/2020   HGB 10.9 (L) 02/02/2020   HCT 33.6 (L)  02/02/2020   MCV 98.0 02/02/2020   PLT 147 (L) 02/02/2020     STUDIES: No results found.  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  PLAN:    1.  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  He had a complicated postoperative course. CT scan results from August 14, 2019 reviewed independently with a suspicious subcarinal lymph node measuring 1.4 cm.  Follow-up PET scan on August 23, 2019 revealed hypermetabolism in lymph node highly suspicious for recurrence.  Biopsy confirmed recurrence increasing patient's stage from IA up to IIIA. Patient completed concurrent XRT and weekly carboplatinum and Taxol on January 05, 2020.  Patient will now receive 1 year of maintenance immunotherapy using durvalumab.  Proceed with cycle 2 of treatment today.  Return to clinic in 2 weeks for treatment only and then in 4 weeks for further evaluation and consideration of cycle 4. 2.  Pain: Chronic and unrelated to his malignancy.  Continue monitoring and treatment per pain clinic. 3.  Nausea: Patient asked if it were okay to take CBD Gummies for nausea.  From an oncology standpoint this would be okay, but patient did state concern that it would show a positive urine test.  He was instructed to let pain clinic know that he was taking this for oncologic purposes. 4.  Elevated LFTs: Resolved. 5.  Dysphagia: Resolved. Continue Magic mouthwash as needed. 6.  Hypokalemia: Resolved.  Continue oral potassium supplementation. 7.  Leukopenia: Mildly improved.  Monitor. 8.  Anemia: Patient's hemoglobin is now trending up and is 10.9. 9.  Thrombocytopenia: Mild.   Patient expressed understanding and was in agreement with this plan. He also understands that He can call clinic at any time  with any questions, concerns, or complaints.   Cancer Staging Squamous cell carcinoma of left lung [facility]) Staging  form: Lung, AJCC 8th Edition - Clinical stage from 02/12/2019: Stage IIIA (cT1b, cN2, cM0) - Signed by [name], MD on 11/01/2019   [name], MD   02/03/2020 6:27 AM

## 2020-02-02 ENCOUNTER — Inpatient Hospital Stay (HOSPITAL_BASED_OUTPATIENT_CLINIC_OR_DEPARTMENT_OTHER): Payer: Medicare Other | Admitting: Oncology

## 2020-02-02 ENCOUNTER — Other Ambulatory Visit: Payer: Self-pay

## 2020-02-02 ENCOUNTER — Inpatient Hospital Stay: Payer: Medicare Other

## 2020-02-02 ENCOUNTER — Inpatient Hospital Stay: Payer: Medicare Other | Attending: Oncology

## 2020-02-02 VITALS — BP 135/93 | HR 71 | Temp 98.5°F | Resp 16 | Wt 202.1 lb

## 2020-02-02 VITALS — BP 130/81 | HR 70 | Resp 18

## 2020-02-02 DIAGNOSIS — Z832 Family history of diseases of the blood and blood-forming organs and certain disorders involving the immune mechanism: Secondary | ICD-10-CM | POA: Insufficient documentation

## 2020-02-02 DIAGNOSIS — Z5112 Encounter for antineoplastic immunotherapy: Secondary | ICD-10-CM | POA: Insufficient documentation

## 2020-02-02 DIAGNOSIS — D696 Thrombocytopenia, unspecified: Secondary | ICD-10-CM | POA: Insufficient documentation

## 2020-02-02 DIAGNOSIS — Z809 Family history of malignant neoplasm, unspecified: Secondary | ICD-10-CM | POA: Diagnosis not present

## 2020-02-02 DIAGNOSIS — Z833 Family history of diabetes mellitus: Secondary | ICD-10-CM | POA: Diagnosis not present

## 2020-02-02 DIAGNOSIS — G8929 Other chronic pain: Secondary | ICD-10-CM | POA: Diagnosis not present

## 2020-02-02 DIAGNOSIS — C3432 Malignant neoplasm of lower lobe, left bronchus or lung: Secondary | ICD-10-CM | POA: Diagnosis not present

## 2020-02-02 DIAGNOSIS — Z7984 Long term (current) use of oral hypoglycemic drugs: Secondary | ICD-10-CM | POA: Diagnosis not present

## 2020-02-02 DIAGNOSIS — D72819 Decreased white blood cell count, unspecified: Secondary | ICD-10-CM | POA: Insufficient documentation

## 2020-02-02 DIAGNOSIS — C3492 Malignant neoplasm of unspecified part of left bronchus or lung: Secondary | ICD-10-CM

## 2020-02-02 DIAGNOSIS — Z9049 Acquired absence of other specified parts of digestive tract: Secondary | ICD-10-CM | POA: Diagnosis not present

## 2020-02-02 DIAGNOSIS — E876 Hypokalemia: Secondary | ICD-10-CM | POA: Insufficient documentation

## 2020-02-02 DIAGNOSIS — Z880 Allergy status to penicillin: Secondary | ICD-10-CM | POA: Insufficient documentation

## 2020-02-02 DIAGNOSIS — Z79899 Other long term (current) drug therapy: Secondary | ICD-10-CM | POA: Diagnosis not present

## 2020-02-02 DIAGNOSIS — D649 Anemia, unspecified: Secondary | ICD-10-CM | POA: Insufficient documentation

## 2020-02-02 DIAGNOSIS — I129 Hypertensive chronic kidney disease with stage 1 through stage 4 chronic kidney disease, or unspecified chronic kidney disease: Secondary | ICD-10-CM | POA: Diagnosis not present

## 2020-02-02 DIAGNOSIS — Z886 Allergy status to analgesic agent status: Secondary | ICD-10-CM | POA: Diagnosis not present

## 2020-02-02 DIAGNOSIS — F17210 Nicotine dependence, cigarettes, uncomplicated: Secondary | ICD-10-CM | POA: Insufficient documentation

## 2020-02-02 DIAGNOSIS — E1122 Type 2 diabetes mellitus with diabetic chronic kidney disease: Secondary | ICD-10-CM | POA: Insufficient documentation

## 2020-02-02 DIAGNOSIS — R110 Nausea: Secondary | ICD-10-CM | POA: Insufficient documentation

## 2020-02-02 DIAGNOSIS — Z95828 Presence of other vascular implants and grafts: Secondary | ICD-10-CM

## 2020-02-02 DIAGNOSIS — N189 Chronic kidney disease, unspecified: Secondary | ICD-10-CM | POA: Diagnosis not present

## 2020-02-02 LAB — TSH: TSH: 0.941 u[IU]/mL (ref 0.350–4.500)

## 2020-02-02 LAB — CBC WITH DIFFERENTIAL/PLATELET
Abs Immature Granulocytes: 0.01 10*3/uL (ref 0.00–0.07)
Basophils Absolute: 0 10*3/uL (ref 0.0–0.1)
Basophils Relative: 1 %
Eosinophils Absolute: 0 10*3/uL (ref 0.0–0.5)
Eosinophils Relative: 1 %
HCT: 33.6 % — ABNORMAL LOW (ref 39.0–52.0)
Hemoglobin: 10.9 g/dL — ABNORMAL LOW (ref 13.0–17.0)
Immature Granulocytes: 0 %
Lymphocytes Relative: 38 %
Lymphs Abs: 1.5 10*3/uL (ref 0.7–4.0)
MCH: 31.8 pg (ref 26.0–34.0)
MCHC: 32.4 g/dL (ref 30.0–36.0)
MCV: 98 fL (ref 80.0–100.0)
Monocytes Absolute: 0.4 10*3/uL (ref 0.1–1.0)
Monocytes Relative: 10 %
Neutro Abs: 2 10*3/uL (ref 1.7–7.7)
Neutrophils Relative %: 50 %
Platelets: 147 10*3/uL — ABNORMAL LOW (ref 150–400)
RBC: 3.43 MIL/uL — ABNORMAL LOW (ref 4.22–5.81)
RDW: 18 % — ABNORMAL HIGH (ref 11.5–15.5)
WBC: 3.9 10*3/uL — ABNORMAL LOW (ref 4.0–10.5)
nRBC: 0 % (ref 0.0–0.2)

## 2020-02-02 LAB — COMPREHENSIVE METABOLIC PANEL
ALT: 24 U/L (ref 0–44)
AST: 36 U/L (ref 15–41)
Albumin: 3.9 g/dL (ref 3.5–5.0)
Alkaline Phosphatase: 119 U/L (ref 38–126)
Anion gap: 9 (ref 5–15)
BUN: 7 mg/dL (ref 6–20)
CO2: 25 mmol/L (ref 22–32)
Calcium: 9 mg/dL (ref 8.9–10.3)
Chloride: 106 mmol/L (ref 98–111)
Creatinine, Ser: 0.64 mg/dL (ref 0.61–1.24)
GFR, Estimated: >60 mL/min (ref >60)
Glucose, Bld: 128 mg/dL — ABNORMAL HIGH (ref 70–99)
Potassium: 3.9 mmol/L (ref 3.5–5.1)
Sodium: 140 mmol/L (ref 135–145)
Total Bilirubin: 0.7 mg/dL (ref 0.3–1.2)
Total Protein: 7.1 g/dL (ref 6.5–8.1)

## 2020-02-02 MED ORDER — SODIUM CHLORIDE 0.9% FLUSH
10.0000 mL | Freq: Once | INTRAVENOUS | Status: AC
  Administered 2020-02-02: 10 mL via INTRAVENOUS
  Filled 2020-02-02: qty 10

## 2020-02-02 MED ORDER — HEPARIN SOD (PORK) LOCK FLUSH 100 UNIT/ML IV SOLN
INTRAVENOUS | Status: AC
  Filled 2020-02-02: qty 5

## 2020-02-02 MED ORDER — HEPARIN SOD (PORK) LOCK FLUSH 100 UNIT/ML IV SOLN
500.0000 [IU] | Freq: Once | INTRAVENOUS | Status: AC
  Administered 2020-02-02: 500 [IU] via INTRAVENOUS
  Filled 2020-02-02: qty 5

## 2020-02-02 MED ORDER — SODIUM CHLORIDE 0.9 % IV SOLN
10.0000 mg/kg | Freq: Once | INTRAVENOUS | Status: AC
  Administered 2020-02-02: 1000 mg via INTRAVENOUS
  Filled 2020-02-02: qty 20

## 2020-02-02 MED ORDER — SODIUM CHLORIDE 0.9 % IV SOLN
Freq: Once | INTRAVENOUS | Status: AC
  Filled 2020-02-02: qty 250

## 2020-02-02 NOTE — Progress Notes (Signed)
1229- Patient tolerated treatment well. Patient stable and discharged to home at this time.

## 2020-02-03 ENCOUNTER — Other Ambulatory Visit: Payer: Self-pay | Admitting: Family Medicine

## 2020-02-03 LAB — T4: T4, Total: 7 ug/dL (ref 4.5–12.0)

## 2020-02-06 IMAGING — DX DG CHEST 1V PORT
1 series · 1 of 1 positions shown · non-contrast
Comparison: CT 12/16/2018, 11/17/2018. Chest x-ray 11/18/2018.
PET-CT 11/04/2018.

CLINICAL DATA: Pneumothorax post biopsy.

EXAM:
PORTABLE CHEST 1 VIEW

[chest ap]
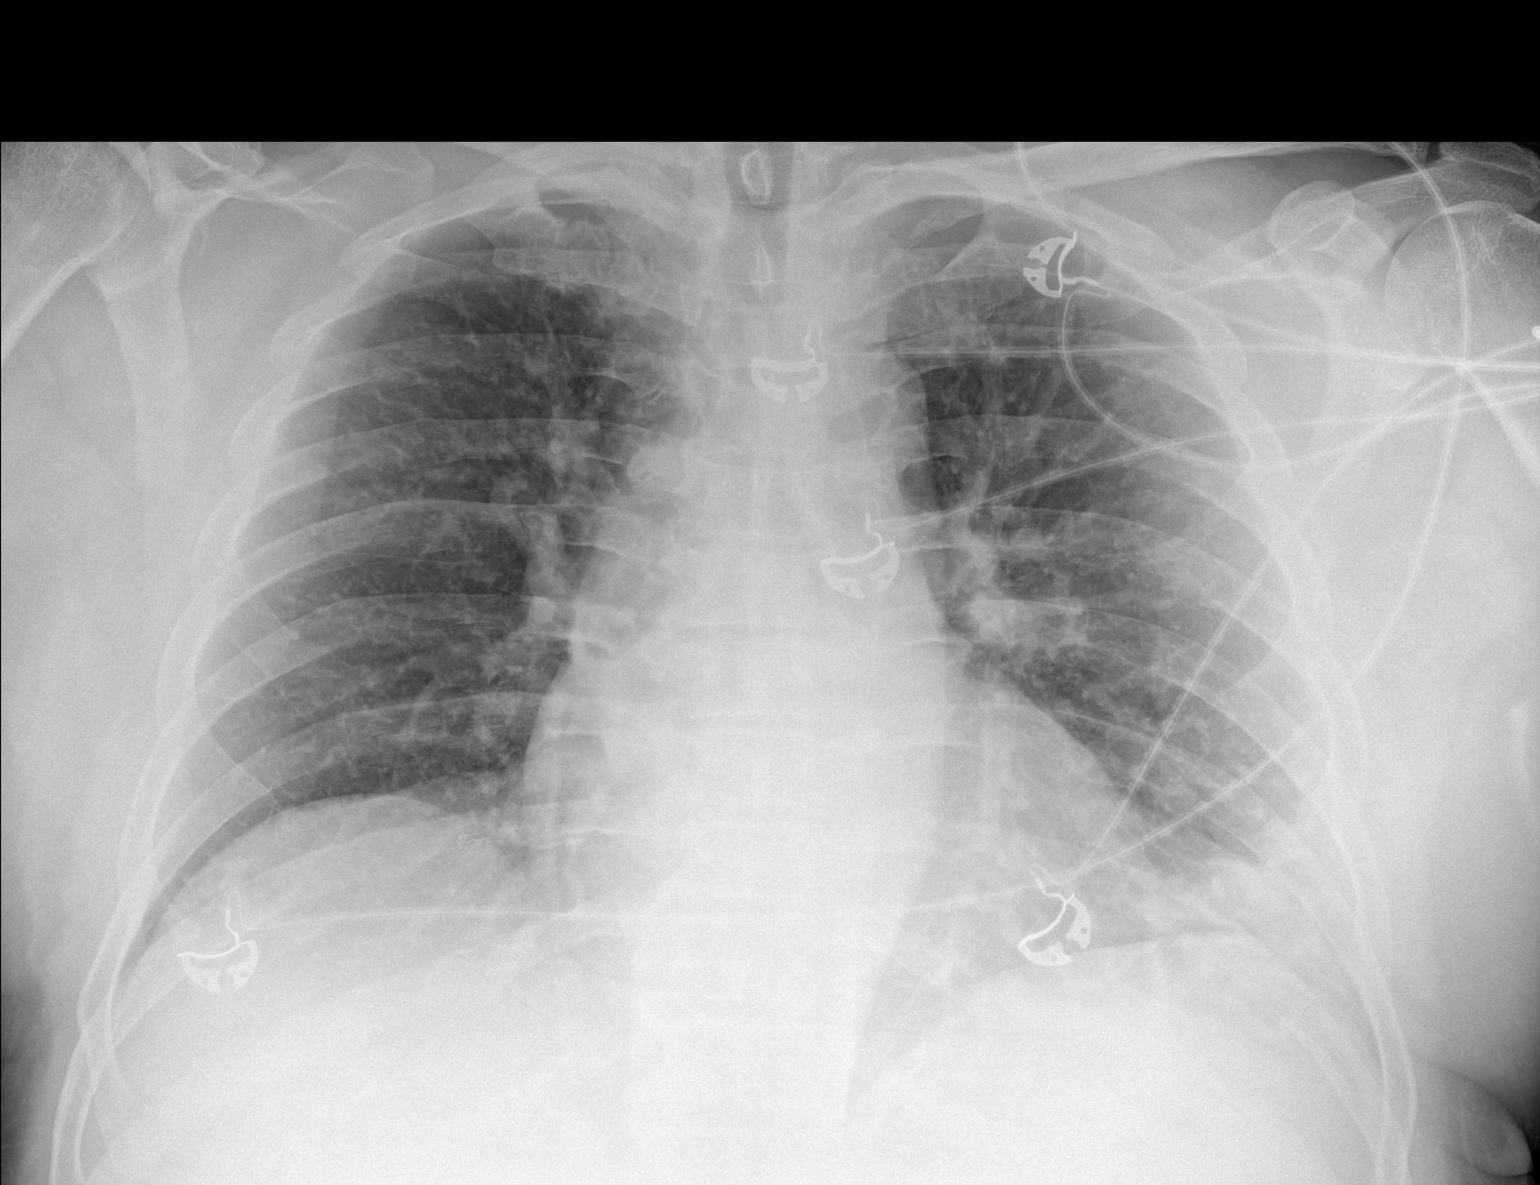

[1 of 1 positions shown; findings below may reference images not displayed]

FINDINGS: Mediastinum and hilar structures normal. Heart size normal.
Peripheral left base atelectasis/infiltrate. Pulmonary infarct
cannot be excluded. Previously identified left lung pulmonary nodule
best identified by prior CT. No prominent pleural effusion. No
pneumothorax identified. No acute bony abnormality identified.
IMPRESSION: 1.  No pneumothorax identified.

2. Peripheral left base atelectasis/infiltrate. Given the peripheral
nature of this, pulmonary infarct cannot be excluded.

3. Previously identified left lung pulmonary nodule best identified
by prior CT.

## 2020-02-06 IMAGING — CT CT BIOPSY
2 of 3 series · 10 of 14 positions shown, 11 images · non-contrast
Comparison: none

INDICATION: 57-year-old male with a history of left lower lobe nodule with SUV
uptake possible carcinoma.

[Series 2: i-spiral 5.0 b30f · axial · 0.91mm/px · z∈[-120,-10]mm · 3 of 23 slices shown, 4 images]
[im 1/23  soft-tissue]
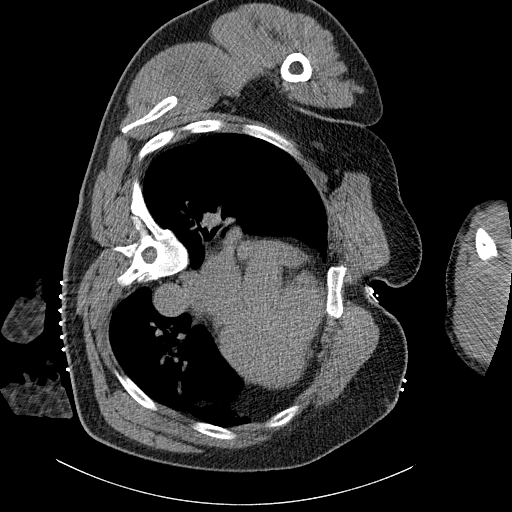
[im 1/23  bone]
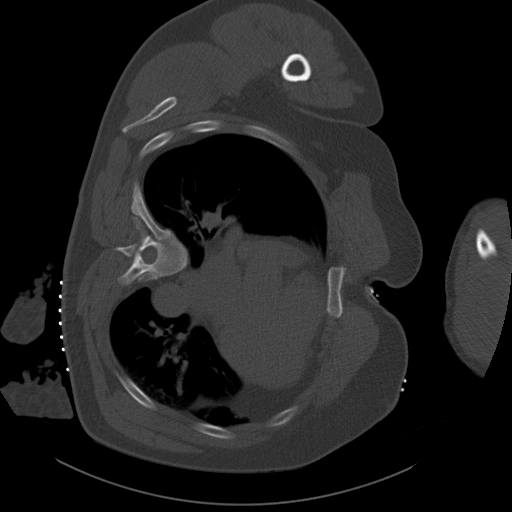
[im 12/23  bone]
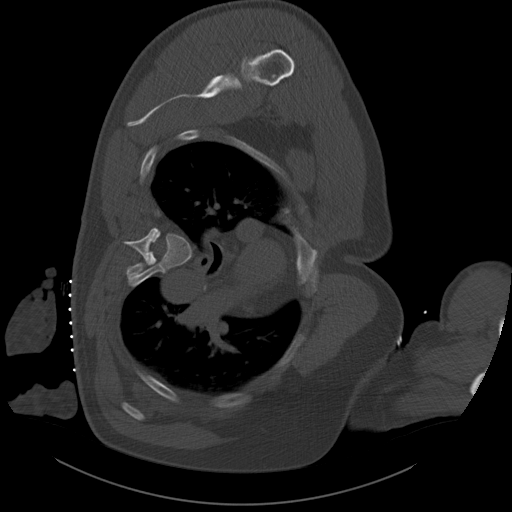
[im 23/23  bone]
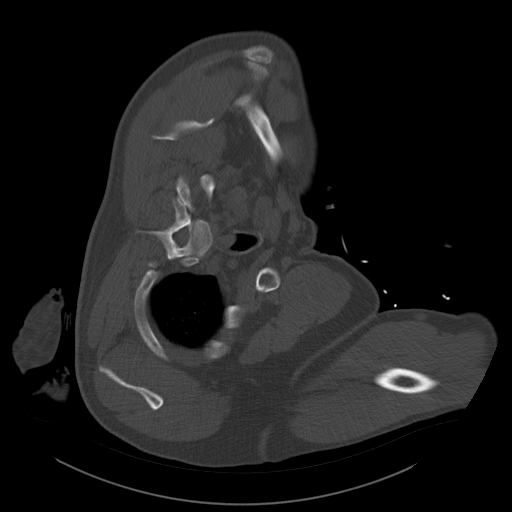

[Series 3: i-sequence 4.8 b30s · axial · 0.91mm/px · z∈[-88,-77]mm · 7 of 81 slices shown]
[im 11/81  bone]
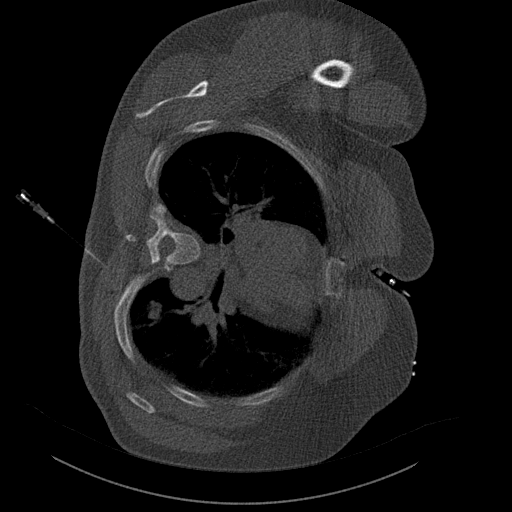
[im 21/81  bone]
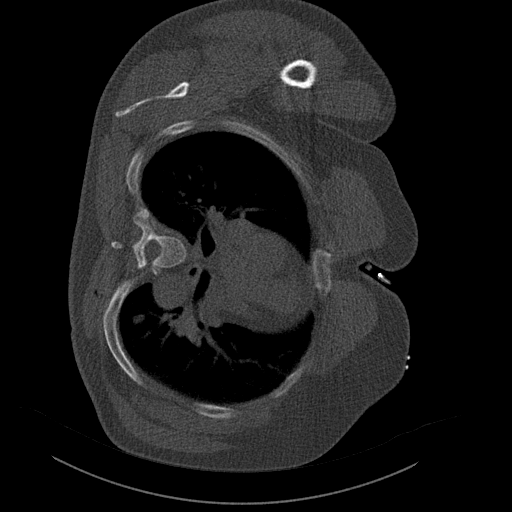
[im 31/81  bone]
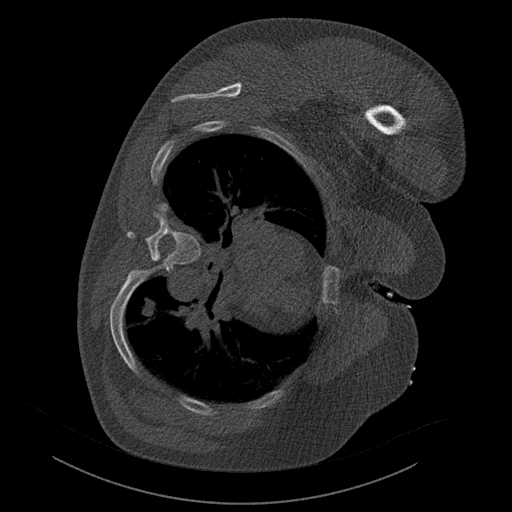
[im 41/81  bone]
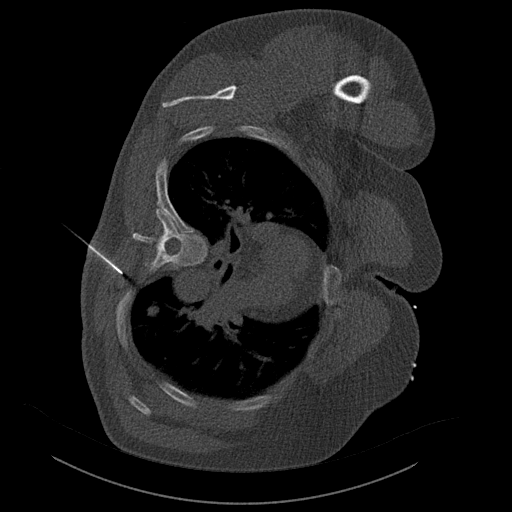
[im 51/81  bone]
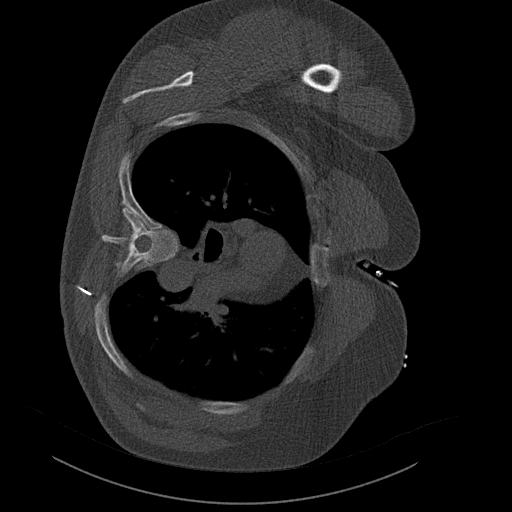
[im 61/81  bone]
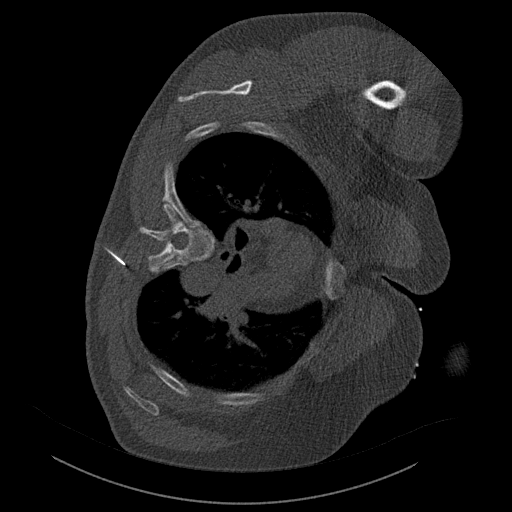
[im 71/81  bone]
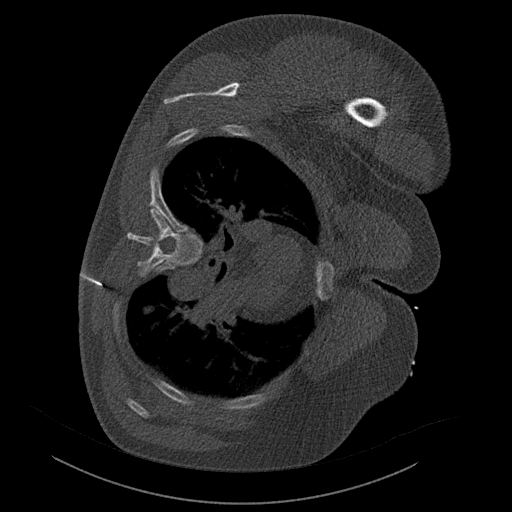

[10 of 14 positions shown; findings below may reference images not displayed]

The patient has an allergy to local anesthesia.

EXAM:
CT BIOPSY

MEDICATIONS:
None.

ANESTHESIA/SEDATION:
Moderate (conscious) sedation was employed during this procedure. A
total of Versed 3.0 mg and Fentanyl 125 mcg was administered
intravenously.

Moderate Sedation Time: 29 minutes. The patient's level of
consciousness and vital signs were monitored continuously by
radiology nursing throughout the procedure under my direct
supervision.

FLUOROSCOPY TIME:  CT

COMPLICATIONS:
None immediate.

PROCEDURE:
The procedure, risks, benefits, and alternatives were explained to
the patient and the patient's family. Specific risks that were
addressed included bleeding, infection, pneumothorax, need for
further procedure including chest tube placement, chance of delayed
pneumothorax or hemorrhage, hemoptysis, nondiagnostic sample,
cardiopulmonary collapse, death. Questions regarding the procedure
were encouraged and answered. The patient understands and consents
to the procedure.

Patient was positioned in the left decubitus position on the CT
gantry table and a scout CT of the chest was performed for planning
purposes.

Given the patient's severe allergy to all local anesthetics, ice
packs were placed before images were acquired.

Once angle of approach was determined, the skin and subcutaneous
tissues this scan was prepped and draped in the usual sterile
fashion, and a sterile drape was applied covering the operative
field. A sterile gown and sterile gloves were used for the
procedure. Local anesthesia was deferred.

Using CT guidance, a 17 gauge trocar needle was advanced into the
left lower lobetarget. After confirmation of the tip, separate 18
gauge core biopsies were performed. These were placed into solution
for transportation to the lab.

Biosentry Device was deployed. A final CT image was performed.

Patient tolerated the procedure well and remained hemodynamically
stable throughout.

No complications were encountered and no significant blood loss was
encounter
IMPRESSION: Status post CT-guided biopsy of left lower lobe nodule. Tissue
specimen sent to pathology for complete histopathologic analysis.

## 2020-02-09 ENCOUNTER — Encounter: Payer: Self-pay | Admitting: Radiation Oncology

## 2020-02-11 ENCOUNTER — Encounter: Payer: Self-pay | Admitting: Radiation Oncology

## 2020-02-11 ENCOUNTER — Ambulatory Visit
Admission: RE | Admit: 2020-02-11 | Discharge: 2020-02-11 | Disposition: A | Discharge status: Home / Self Care | Payer: Medicare Other | Source: Ambulatory Visit | Attending: Radiation Oncology | Admitting: Radiation Oncology

## 2020-02-11 ENCOUNTER — Other Ambulatory Visit: Payer: Self-pay

## 2020-02-11 VITALS — BP 158/95 | HR 68 | Temp 96.4°F | Wt 200.4 lb

## 2020-02-11 DIAGNOSIS — C3492 Malignant neoplasm of unspecified part of left bronchus or lung: Secondary | ICD-10-CM

## 2020-02-11 NOTE — Progress Notes (Signed)
Radiation Oncology Follow up Note  Name: Kristopher [name]   Date:   02/11/2020 MRN:  [id number] DOB: November 02, 1960    This 60 y.o. male presents to the clinic today for 1 month follow-up status post concurrent chemoradiation therapy for stage IIIa squamous cell carcinoma of the left lung status post left lower lobectomy.  REFERRING PROVIDER: [name], DO  HPI: Patient is a 58 year old male status post left lower lobectomy for squamous cell carcinoma.  He developed.  PET positive recurrence in the carina bronchoscopy again was positive squamous cell Carcinoma the subcarinal node.  He is now 1 month out receiving carboplatin Taxol and radiation therapy.  He is doing well clinically he specifically denies cough hemoptysis or chest tightness.  He is currently on maintenance immunotherapy using durvalumab which she is tolerating well.  He continues to have some mid abdomen nominal pain of unknown etiology being monitored.  COMPLICATIONS OF TREATMENT: none  FOLLOW UP COMPLIANCE: keeps appointments   PHYSICAL EXAM:  BP (!) 158/95   Pulse 68   Temp (!) 96.4 F (35.8 C) (Tympanic)   Wt 200 lb 6.4 oz (90.9 kg)   BMI 28.75 kg/m  Well-developed well-nourished patient in NAD. HEENT reveals PERLA, EOMI, discs not visualized.  Oral cavity is clear. No oral mucosal lesions are identified. Neck is clear without evidence of cervical or supraclavicular adenopathy. Lungs are clear to A&P. Cardiac examination is essentially unremarkable with regular rate and rhythm without murmur rub or thrill. Abdomen is benign with no organomegaly or masses noted. Motor sensory and DTR levels are equal and symmetric in the upper and lower extremities. Cranial nerves II through XII are grossly intact. Proprioception is intact. No peripheral adenopathy or edema is identified. No motor or sensory levels are noted. Crude visual fields are within normal range.  RADIOLOGY RESULTS: No current films to review  PLAN: Present  time patient is doing well currently on maintenance immunotherapy and tolerating that well.  I have asked to see him back in 4 months for follow-up.  Patient knows to call with any concerns.  We will review CT scans when they are available.  I would like to take this opportunity to thank you for allowing me to participate in the care of your patient.Noreene Filbert, MD

## 2020-02-13 NOTE — Chronic Care Management (AMB) (Deleted)
Chronic Care Management Pharmacy  Name: Kristopher [name]  MRN: [id number] DOB: 1960-05-02   Chief Complaint/ HPI  Kristopher [name],  59 y.o. , male presents for their Follow-Up CCM visit with the clinical pharmacist via telephone.  PCP : [name], DO Patient Care Team: [name], DO as PCP - General (Family Medicine) Gavin Pound, CMA (Inactive) as Certified Medical Assistant Telford Nab, RN as Registered Nurse Vanita Ingles, RN as Case Manager (General Practice) [name], MD as Referring Physician (Pain Medicine) [name], MD as Consulting Physician (Oncology)  Their chronic conditions include: Hypertension, Hyperlipidemia, Diabetes, Tobacco use (recurrent lung cancer)  and chronic pain     Allergies  Allergen Reactions  . Acetaminophen Swelling  . Aspirin Anaphylaxis  . Epinephrine Anaphylaxis    Does not include albuterol  . Novocain [Procaine] Anaphylaxis  . Penicillins Anaphylaxis    Has patient had a PCN reaction causing immediate rash, facial/tongue/throat swelling, SOB or lightheadedness with hypotension: Yes Has patient had a PCN reaction causing severe rash involving mucus membranes or skin necrosis: No Has patient had a PCN reaction that required hospitalization: Yes Has patient had a PCN reaction occurring within the last 10 years: No If all of the above answers are "NO", then may proceed with Cephalosporin use.   . Strawberry Extract Anaphylaxis  . Shellfish Allergy Hives and Nausea And Vomiting    Medications: Outpatient Encounter Medications as of 02/14/2020  Medication Sig  . Accu-Chek FastClix Lancets MISC USE TO TEST BLOOD SUGAR 2X A DAY  . ACCU-CHEK GUIDE test strip USE TO TEST BLOOD SUGAR 2X A DAY  . albuterol (VENTOLIN HFA) 108 (90 Base) MCG/ACT inhaler Inhale into the lungs.  [name] amLODipine (NORVASC) 5 MG tablet Take 1 tablet (5 mg total) by mouth 2 (two) times daily.  . ARNUITY ELLIPTA 100 MCG/ACT  AEPB Inhale 1 puff into the lungs daily.   [name] atorvastatin (LIPITOR) 10 MG tablet Take 1 tablet (10 mg total) by mouth daily at 6 PM.  . BREZTRI AEROSPHERE 160-9-4.8 MCG/ACT AERO Inhale 2 puffs into the lungs 2 (two) times daily. Maintenance per pulmonology  . diphenhydrAMINE (BENADRYL) 25 MG tablet Take 50 mg by mouth daily.  . Dulaglutide (TRULICITY) 1.5 WL/8.9HT SOPN Inject 1.5 mg into the skin every Wednesday.  . empagliflozin (JARDIANCE) 25 MG TABS tablet Take 1 tablet (25 mg total) by mouth daily before breakfast.  . esomeprazole (NEXIUM) 40 MG capsule Take 1 capsule (40 mg total) by mouth daily.  . hydrALAZINE (APRESOLINE) 100 MG tablet Take 1 tablet (100 mg total) by mouth 2 (two) times daily.  [name] lidocaine (LIDODERM) 5 % Place 1 patch onto the skin daily. Remove & Discard patch within 12 hours or as directed by MD (Patient taking differently: Place 1 patch onto the skin daily as needed (pain). Remove & Discard patch within 12 hours or as directed by MD)  . metFORMIN (GLUCOPHAGE) 500 MG tablet Take 2 tablets (1,000 mg total) by mouth 2 (two) times daily with a meal.  . metoprolol tartrate (LOPRESSOR) 50 MG tablet Take 1 tablet (50 mg total) by mouth 2 (two) times daily.  . montelukast (SINGULAIR) 10 MG tablet Take 10 mg by mouth daily.  [name] morphine (MSIR) 15 MG tablet Take 1 tablet (15 mg total) by mouth every 6 (six) hours as needed for moderate pain or severe pain. Must last 30 days.  . Multiple Vitamin (MULTIVITAMIN WITH MINERALS) TABS tablet Take 1  tablet by mouth daily.  . NONFORMULARY OR COMPOUNDED ITEM Apply 1-2 mLs topically 4 (four) times daily as needed. 10% Ketamine/2% Cyclobenzaprine/6% Gabapentin Cream  . nortriptyline (PAMELOR) 25 MG capsule TAKE 2 CAPSULES (50 MG TOTAL) BY MOUTH AT BEDTIME.  [name] nystatin (MYCOSTATIN) 100000 UNIT/ML suspension Take 5 mLs (500,000 Units total) by mouth 4 (four) times daily.  . ondansetron (ZOFRAN) 8 MG tablet TAKE 1 TABLET (8 MG TOTAL) BY MOUTH 2  (TWO) TIMES DAILY AS NEEDED FOR REFRACTORY NAUSEA / VOMITING.  [name] oxyCODONE (OXY IR/ROXICODONE) 5 MG immediate release tablet Take 1 tablet (5 mg total) by mouth every 4 (four) hours as needed for moderate pain or severe pain.  . pantoprazole (PROTONIX) 40 MG tablet Take 1 tablet (40 mg total) by mouth daily.  . Potassium Chloride ER 20 MEQ TBCR TAKE 1 TABLET BY MOUTH EVERY DAY (Patient taking differently: Take 20 mEq by mouth daily. )  . prochlorperazine (COMPAZINE) 10 MG tablet TAKE 1 TABLET (10 MG TOTAL) BY MOUTH EVERY 6 (SIX) HOURS AS NEEDED (NAUSEA OR VOMITING).  . STIOLTO RESPIMAT 2.5-2.5 MCG/ACT AERS Inhale 2 puffs into the lungs daily. Prn per pulmonology notes  . sucralfate (CARAFATE) 1 g tablet Take 1 tablet (1 g total) by mouth 3 (three) times daily. Dissolve in 3-4 tbsp warm water, swish and swallow  . SUMAtriptan (IMITREX) 50 MG tablet Take 1 tablet (50 mg total) by mouth every 2 (two) hours as needed for migraine. Take 1 tab at onset of migraine. May repeat in 2 hours if headache persists or recurs.   Facility-Administered Encounter Medications as of 02/14/2020  Medication  . 0.9 %  sodium chloride infusion    Wt Readings from Last 3 Encounters:  02/11/20 200 lb 6.4 oz (90.9 kg)  02/02/20 202 lb 1.6 oz (91.7 kg)  01/19/20 200 lb (90.7 kg)    Current Diagnosis/Assessment:    Goals Addressed   None     COPD / Asthma / Tobacco abuse with recurrent lung cancer    Gold Grade: Gold 1 (FEV1>80%) Current COPD Classification:  2B  Eosinophil count:   Lab Results  Component Value Date/Time   EOSPCT 1 02/02/2020 08:52 AM  %                               Eos (Absolute):  Lab Results  Component Value Date/Time   EOSABS 0.0 02/02/2020 08:52 AM   EOSABS 0.1 04/27/2019 08:08 AM    Tobacco Status:  Social History   Tobacco Use  Smoking Status Current Every Day Smoker  . Packs/day: 0.05  . Years: 35.00  . Pack years: 1.75  . Types: Cigarettes  . Last attempt to  quit: 01/14/2019  . Years since quitting: 1.0  Smokeless Tobacco Never Used  Tobacco Comment   1 pack last 3 days     Patient has failed these meds in past: Anoro Patient is currently controlled on the following medications:  Breztri 2 puffs bid Stiolto 2 puffs daily    (both meds per Pulmonology at 10/22/19 appt) Albuterol 2 pufffs q6 h prn wheezing  Using maintenance inhaler regularly? Yes Frequency of rescue inhaler use:  infrequently  We discussed:  Pulmonology notes state Stiolto used prn in addition [employer]. Mr. Shorey knows he takes 2 puffs 2 times daily of an inhaler and has another for as needed use. He does not remember the names of the inhalers. His wife  helps manage his medications. He denies any shortness of breath.Follow up with Pulm in October.  Plan  Continue current medications  and  Hypertension   BP goal is:  <130/80  Office blood pressures are  BP Readings from Last 3 Encounters:  02/11/20 (!) 158/95  02/02/20 130/81  02/02/20 (!) 135/93     Patient checks BP at home 3-5x per week  Patient home BP readings are ranging: 128/84 yesterday. Hasn't checked today and states readings have been 120s/80s  We discussed: Patient is watching his diet and spouse prepares most meals at home. His BP has been normal every day this week when checked prior to chemo/radiation.  Plan  Continue current medications       Medication Management   Pt [facility] for all medications Uses pill box? Yes wife fills weekly   Plan  Continue current medication management strategy    Follow up: 3  month phone visit  Junita Push. Kenton Kingfisher PharmD, [facility] [phone]

## 2020-02-14 ENCOUNTER — Telehealth: Payer: Self-pay

## 2020-02-16 ENCOUNTER — Inpatient Hospital Stay: Payer: Medicare Other

## 2020-02-16 ENCOUNTER — Other Ambulatory Visit: Payer: Self-pay | Admitting: Oncology

## 2020-02-16 VITALS — BP 160/97 | HR 65 | Temp 97.5°F | Resp 20 | Wt 197.0 lb

## 2020-02-16 DIAGNOSIS — Z5112 Encounter for antineoplastic immunotherapy: Secondary | ICD-10-CM | POA: Diagnosis not present

## 2020-02-16 DIAGNOSIS — D72819 Decreased white blood cell count, unspecified: Secondary | ICD-10-CM | POA: Diagnosis not present

## 2020-02-16 DIAGNOSIS — D649 Anemia, unspecified: Secondary | ICD-10-CM | POA: Diagnosis not present

## 2020-02-16 DIAGNOSIS — Z809 Family history of malignant neoplasm, unspecified: Secondary | ICD-10-CM | POA: Diagnosis not present

## 2020-02-16 DIAGNOSIS — C3492 Malignant neoplasm of unspecified part of left bronchus or lung: Secondary | ICD-10-CM

## 2020-02-16 DIAGNOSIS — N189 Chronic kidney disease, unspecified: Secondary | ICD-10-CM | POA: Diagnosis not present

## 2020-02-16 DIAGNOSIS — I129 Hypertensive chronic kidney disease with stage 1 through stage 4 chronic kidney disease, or unspecified chronic kidney disease: Secondary | ICD-10-CM | POA: Diagnosis not present

## 2020-02-16 DIAGNOSIS — Z7984 Long term (current) use of oral hypoglycemic drugs: Secondary | ICD-10-CM | POA: Diagnosis not present

## 2020-02-16 DIAGNOSIS — C3432 Malignant neoplasm of lower lobe, left bronchus or lung: Secondary | ICD-10-CM | POA: Diagnosis not present

## 2020-02-16 DIAGNOSIS — Z880 Allergy status to penicillin: Secondary | ICD-10-CM | POA: Diagnosis not present

## 2020-02-16 DIAGNOSIS — G8929 Other chronic pain: Secondary | ICD-10-CM | POA: Diagnosis not present

## 2020-02-16 DIAGNOSIS — R110 Nausea: Secondary | ICD-10-CM | POA: Diagnosis not present

## 2020-02-16 DIAGNOSIS — D696 Thrombocytopenia, unspecified: Secondary | ICD-10-CM | POA: Diagnosis not present

## 2020-02-16 DIAGNOSIS — Z832 Family history of diseases of the blood and blood-forming organs and certain disorders involving the immune mechanism: Secondary | ICD-10-CM | POA: Diagnosis not present

## 2020-02-16 DIAGNOSIS — Z9049 Acquired absence of other specified parts of digestive tract: Secondary | ICD-10-CM | POA: Diagnosis not present

## 2020-02-16 DIAGNOSIS — Z886 Allergy status to analgesic agent status: Secondary | ICD-10-CM | POA: Diagnosis not present

## 2020-02-16 DIAGNOSIS — Z79899 Other long term (current) drug therapy: Secondary | ICD-10-CM | POA: Diagnosis not present

## 2020-02-16 DIAGNOSIS — E1122 Type 2 diabetes mellitus with diabetic chronic kidney disease: Secondary | ICD-10-CM | POA: Diagnosis not present

## 2020-02-16 DIAGNOSIS — F17210 Nicotine dependence, cigarettes, uncomplicated: Secondary | ICD-10-CM | POA: Diagnosis not present

## 2020-02-16 DIAGNOSIS — E876 Hypokalemia: Secondary | ICD-10-CM | POA: Diagnosis not present

## 2020-02-16 DIAGNOSIS — Z833 Family history of diabetes mellitus: Secondary | ICD-10-CM | POA: Diagnosis not present

## 2020-02-16 MED ORDER — SODIUM CHLORIDE 0.9 % IV SOLN
Freq: Once | INTRAVENOUS | Status: AC
Start: 2020-02-16 — End: 2020-02-16
  Filled 2020-02-16: qty 250

## 2020-02-16 MED ORDER — SODIUM CHLORIDE 0.9% FLUSH
10.0000 mL | INTRAVENOUS | Status: DC | PRN
  Administered 2020-02-16: 10 mL
  Filled 2020-02-16: qty 10

## 2020-02-16 MED ORDER — SODIUM CHLORIDE 0.9 % IV SOLN
10.0000 mg/kg | Freq: Once | INTRAVENOUS | Status: AC
  Administered 2020-02-16: 1000 mg via INTRAVENOUS
  Filled 2020-02-16: qty 20

## 2020-02-16 MED ORDER — HEPARIN SOD (PORK) LOCK FLUSH 100 UNIT/ML IV SOLN
INTRAVENOUS | Status: AC
  Filled 2020-02-16: qty 5

## 2020-02-16 MED ORDER — HEPARIN SOD (PORK) LOCK FLUSH 100 UNIT/ML IV SOLN
500.0000 [IU] | Freq: Once | INTRAVENOUS | Status: AC | PRN
  Administered 2020-02-16: 500 [IU]
  Filled 2020-02-16: qty 5

## 2020-02-16 NOTE — Progress Notes (Signed)
Stable at discharge 

## 2020-02-17 ENCOUNTER — Other Ambulatory Visit: Payer: Self-pay | Admitting: Family Medicine

## 2020-02-20 ENCOUNTER — Other Ambulatory Visit: Payer: Self-pay | Admitting: Family Medicine

## 2020-02-20 NOTE — Progress Notes (Signed)
PROVIDER NOTE: Information contained herein reflects review and annotations entered in association with encounter. Interpretation of such information and data should be left to medically-trained personnel. Information provided to patient can be located elsewhere in the medical record under "Patient Instructions". Document created using STT-dictation technology, any transcriptional errors that may result from process are unintentional.    Patient: Kristopher [name]  Service Category: E/M  Provider: Gaspar Cola, MD  DOB: 02/22/1961  DOS: 02/23/2020  Specialty: Interventional Pain Management  MRN: [id number]  Setting: Ambulatory outpatient  PCP: [name], DO  Type: Established Patient    Referring Provider: [name], DO  Location: Office  Delivery: Face-to-face     HPI  [name], a 59 y.o. year old male, is here today because of his Chronic pain syndrome [G89.4]. [name] primary complain today is Back Pain (low) Last encounter: My last encounter with him was on 10/13/2019. Pertinent problems: 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); Chronic neck pain (posterior midline) (Bilateral) (L>R); Cervical spondylosis; Chronic cervical radicular pain (Third area of Pain) (Bilateral) (C5/C6 dermatome) (L>R); Cervical spinal stenosis (C4-5); Cervical foraminal stenosis (Bilateral C5-6); Retrolisthesis of L5-S1; Cervical disc herniation (C4-5 and C5-6); Lumbar disc herniation (L5-S1); Chronic sacroiliac joint pain (Bilateral) (L>R); Chronic hip pain (Left); Lumbar facet syndrome (Location of Primary Source of Pain) (Bilateral) (L>R); Chronic lower extremity pain (Secondary area of Pain) (Left); Greater occipital neuralgia (Right); Chronic pain syndrome; Migraine; Musculoskeletal  pain, chronic; Left-sided weakness; Hip arthritis; S/P hip replacement; Squamous cell carcinoma of left lung (HCC); and Chronic upper extremity pain (Third area of Pain) (Bilateral) (L>R) on their pertinent problem list. Pain Assessment: Severity of   is reported as a 4 /10. Location: Back Lower/radiates down the side of left leg to knee. Onset: More than a month ago. Quality: Stabbing. Timing: Constant. Modifying factor(s): meds help. Vitals:  height is _0  (1.778 m) and weight is 197 lb (89.4 kg). His temperature is 97.6 F (36.4 C). His blood pressure is 159/107 (abnormal) and his pulse is 65. His respiration is 18 and oxygen saturation is 100%.   Reason for encounter: medication management.  The patient indicates doing well with the current medication regimen. No adverse reactions or side effects reported to the medications. PMP & UDS compliant.  The patient indicates that he has been told that his lung cancer has spread.  He was told to ask [place] about the possibility of using some CBD gummy bears.  Today we had a long conversation regarding CBD and I have answered all of his questions.  In addition I have provided him with some written information about it.  In a nutshell, the problem with the CBD is the fact that it has THC impurities and my experience is that it always will test positive for [name] on our forensic urine drug screening tests.  As alternative, I gave him some information about [name] and I have recommended that he take this topic up with his cancer physician to see if it something that he has the indications for.  We do not prescribe [name], but when it is prescribed legally, then any THC found on the UDS can be justified.  RTCB: 05/24/2020  Pharmacotherapy Assessment   Analgesic: Morphine IR 15 mg, 1 tablet p.o. every 6 hours (60 mg/day of morphine) (  60 MME) + Compounded Specialty Analgesic Cream (w/ Ketamine) MME/day:60 mg/day.   Monitoring: [place] PMP: PDMP reviewed during this  encounter.       Pharmacotherapy: No side-effects or adverse reactions reported. Compliance: No problems identified. Effectiveness: Clinically acceptable.  Dewayne Shorter, RN  02/23/2020 10:05 AM  Signed Nursing Pain Medication Assessment:  Safety precautions to be maintained throughout the outpatient stay will include: orient to surroundings, keep bed in low position, maintain call bell within reach at all times, provide assistance with transfer out of bed and ambulation.  Medication Inspection Compliance: Pill count conducted under aseptic conditions, in front of the patient. Neither the pills nor the bottle was removed from the patient's sight at any time. Once count was completed pills were immediately returned to the patient in their original bottle.  Medication: Morphine ER (MSContin) Pill/Patch Count: 7 of 120 pills remain Pill/Patch Appearance: Markings consistent with prescribed medication Bottle Appearance: Standard pharmacy container. Clearly labeled. Filled Date:10 / 24 / 2021 Last Medication intake:  Today  Patient states his current bottle broke and he put his pills in the old bottle    UDS:  Summary  Date Value Ref Range Status  10/13/2019 Note  Final    Comment:    ==================================================================== [employer] Select 13 (MW) ==================================================================== Test                             Result       Flag       Units  Drug Present and Declared for Prescription Verification   Oxycodone                      1276         EXPECTED   ng/mg creat   Oxymorphone                    1252         EXPECTED   ng/mg creat   Noroxycodone                   1238         EXPECTED   ng/mg creat   Noroxymorphone                 131          EXPECTED   ng/mg creat    Sources of oxycodone are scheduled prescription medications.    Oxymorphone, noroxycodone, and noroxymorphone are expected    metabolites of oxycodone.  Oxymorphone is also available as a    scheduled prescription medication.  ==================================================================== Test                      Result    Flag   Units      Ref Range   Creatinine              121              mg/dL      >=20 ==================================================================== Declared Medications:  The flagging and interpretation on this report are based on the  following declared medications.  Unexpected results may arise from  inaccuracies in the declared medications.   **Note: The testing scope of this panel includes these medications:   Oxycodone   **Note: The testing scope of this panel does not include the  following reported medications:   Amlodipine (Norvasc)  Atorvastatin (  Lipitor)  Dulaglutide (Trulicity)  Fluticasone  Hydralazine (Apresoline)  Metformin (Glucophage)  Metoprolol (Toprol)  Montelukast (Singulair)  Nortriptyline (Pamelor)  Pantoprazole (Protonix)  Potassium (Klor-Con)  Sumatriptan (Imitrex)  Topical Lidocaine ==================================================================== For clinical consultation, please call [phone]. ====================================================================      ROS  Constitutional: Denies any fever or chills Gastrointestinal: No reported hemesis, hematochezia, vomiting, or acute GI distress Musculoskeletal: Denies any acute onset joint swelling, redness, loss of ROM, or weakness Neurological: No reported episodes of acute onset apraxia, aphasia, dysarthria, agnosia, amnesia, paralysis, loss of coordination, or loss of consciousness  Medication Review  Accu-Chek FastClix Lancets, Budeson-Glycopyrrol-Formoterol, Dulaglutide, Fluticasone Furoate, NONFORMULARY OR COMPOUNDED ITEM, Potassium Chloride ER, SUMAtriptan, Tiotropium Bromide-Olodaterol, albuterol, amLODipine, atorvastatin, diphenhydrAMINE, empagliflozin, esomeprazole, glucose blood,  hydrALAZINE, lidocaine, metFORMIN, metoprolol tartrate, montelukast, morphine, multivitamin with minerals, nortriptyline, nystatin, ondansetron, oxyCODONE, pantoprazole, prochlorperazine, and sucralfate  History Review  Allergy: Mr. Kittel is allergic to acetaminophen, aspirin, epinephrine, novocain [procaine], penicillins, strawberry extract, and shellfish allergy. Drug: Mr. Sivils  reports current drug use. Drug: Oxycodone. Alcohol:  reports no history of alcohol use. Tobacco:  reports that he has been smoking cigarettes. He has a 1.75 pack-year smoking history. He has never used smokeless tobacco. Social: Mr. Wendell  reports that he has been smoking cigarettes. He has a 1.75 pack-year smoking history. He has never used smokeless tobacco. He reports current drug use. Drug: Oxycodone. He reports that he does not drink alcohol. Medical:  has a past medical history of Allergy, Arthritis, Benign hypertensive kidney disease, Chronic back pain, Diabetes mellitus, type 2 ([place]), Dyspnea, GERD (gastroesophageal reflux disease), Hypertension, Malignant neoplasm of lung ([place]), and Migraines. Surgical: Mr. Yim  has a past surgical history that includes Back surgery; Appendectomy; Leg Surgery; Knee surgery (Left); Foot surgery (Left); Colonoscopy with propofol (N/A, 02/19/2016); polypectomy (N/A, 02/19/2016); Total hip arthroplasty (Left, 12/02/2017); Colonoscopy with propofol (N/A, 01/19/2018); polypectomy (01/19/2018); DG OPERATIVE LEFT HIP ([place] HX); Joint replacement (Left, 12/2017); Video bronchoscopy (Left, 01/14/2019); Thoracotomy (Left, 01/14/2019); Flexible bronchoscopy (Bilateral, 01/20/2019); Flexible bronchoscopy (Bilateral, 01/22/2019); Electormagnetic navigation bronchoscopy (Left, 11/18/2018); Lung cancer surgery; Video bronchoscopy with endobronchial ultrasound (N/A, 10/15/2019); Video bronchoscopy with endobronchial navigation (N/A, 10/15/2019); and Portacath placement (N/A, 11/11/2019). Family: family  history includes Cancer in his father; Diabetes in his sister; Thrombosis in his sister.  Laboratory Chemistry Profile   Renal Lab Results  Component Value Date   BUN 7 02/02/2020   CREATININE 0.64 02/02/2020   BCR 15 11/26/2019   GFRAA >60 12/01/2019   GFRNONAA >60 02/02/2020     Hepatic Lab Results  Component Value Date   AST 36 02/02/2020   ALT 24 02/02/2020   ALBUMIN 3.9 02/02/2020   ALKPHOS 119 02/02/2020   LIPASE 50 02/17/2019     Electrolytes Lab Results  Component Value Date   NA 140 02/02/2020   K 3.9 02/02/2020   CL 106 02/02/2020   CALCIUM 9.0 02/02/2020   MG 2.4 01/25/2019   PHOS 3.5 01/23/2019     Bone No results found for: VD25OH, VD125OH2TOT, DG6440HK7, QQ5956LO7, 25OHVITD1, 25OHVITD2, 25OHVITD3, TESTOFREE, TESTOSTERONE   Inflammation (CRP: Acute Phase) (ESR: Chronic Phase) Lab Results  Component Value Date   CRP 0.7 04/03/2015   ESRSEDRATE 11 10/21/2017   LATICACIDVEN 1.7 01/16/2019       Note: Above Lab results reviewed.  Recent Imaging Review  X-ray chest PA or AP CLINICAL DATA:  Port-A-Cath placement  EXAM: CHEST  1 VIEW  COMPARISON:  Chest radiograph November 10, 2019 study obtained earlier in the  day  FINDINGS: Port-A-Cath tip is in the superior vena cava. No pneumothorax. No edema or airspace opacity. Heart size within normal limits. Pulmonary vascularity normal. No adenopathy appreciable by radiography. No evident bone lesions.  IMPRESSION: Port-A-Cath tip in superior vena cava. No pneumothorax. Otherwise no change compared to study obtained earlier in the day.  Electronically Signed   By: Lowella Grip III M.D.   On: 11/11/2019 10:51 DG C-Arm 1-60 Min-No Report Fluoroscopy was utilized by the requesting physician.  No radiographic  interpretation.  Note: Reviewed        Physical Exam  General appearance: Well nourished, well developed, and well hydrated. In no apparent acute distress Mental status: Alert, oriented  x 3 (person, place, & time)       Respiratory: No evidence of acute respiratory distress Eyes: PERLA Vitals: BP (!) 159/107   Pulse 65   Temp 97.6 F (36.4 C)   Resp 18   Ht _0  (1.778 m)   Wt 197 lb (89.4 kg)   SpO2 100%   BMI 28.27 kg/m  BMI: Estimated body mass index is 28.27 kg/m as calculated from the following:   Height as of this encounter: _1  (1.778 m).   Weight as of this encounter: 197 lb (89.4 kg). Ideal: Ideal body weight: 73 kg (160 lb 15 oz) Adjusted ideal body weight: 79.5 kg (175 lb 5.8 oz)  Assessment   Status Diagnosis  Controlled Controlled Controlled 1. Chronic pain syndrome   2. Chronic low back pain (Primary Area of Pain) (Bilateral) (L>R)   3. Chronic lower extremity pain (Secondary area of Pain) (Left)   4. Chronic lumbar radicular pain (Secondary area of Pain) (S1 dermatomal) (Left)   5. Chronic hip pain (Left)   6. Chronic cervical radicular pain (Third area of Pain) (Bilateral) (C5/C6 dermatome) (L>R)   7. Chronic pain of both upper extremities   8. Pharmacologic therapy   9. Uncomplicated opioid dependence ([place])   10. Squamous cell carcinoma of left lung ([place])      Updated Problems: No problems updated.  Plan of Care  Problem-specific:  No problem-specific Assessment & Plan notes found for this encounter.  Mr. OLEG OLESON has a current medication list which includes the following long-term medication(s): amlodipine, atorvastatin, esomeprazole, hydralazine, metformin, metoprolol tartrate, nortriptyline, pantoprazole, potassium chloride er, prochlorperazine, sucralfate, sumatriptan, [START ON 04/24/2020] morphine, [START ON 02/24/2020] morphine, and [START ON 03/25/2020] morphine.  Pharmacotherapy (Medications Ordered): Meds ordered this encounter  Medications  . morphine (MSIR) 15 MG tablet    Sig: Take 1 tablet (15 mg total) by mouth every 6 (six) hours as needed for moderate pain or severe pain. Must last 30 days.    Dispense:   120 tablet    Refill:  0    Chronic Pain: STOP Act (Not applicable) Fill 1 day early if closed on refill date. Avoid benzodiazepines within 8 hours of opioids  . morphine (MSIR) 15 MG tablet    Sig: Take 1 tablet (15 mg total) by mouth every 6 (six) hours as needed for moderate pain or severe pain. Must last 30 days.    Dispense:  120 tablet    Refill:  0    Chronic Pain: STOP Act (Not applicable) Fill 1 day early if closed on refill date. Avoid benzodiazepines within 8 hours of opioids  . morphine (MSIR) 15 MG tablet    Sig: Take 1 tablet (15 mg total) by mouth every 6 (six) hours as needed for  moderate pain or severe pain. Must last 30 days.    Dispense:  120 tablet    Refill:  0    Chronic Pain: STOP Act (Not applicable) Fill 1 day early if closed on refill date. Avoid benzodiazepines within 8 hours of opioids   Orders:  No orders of the defined types were placed in this encounter.  Follow-up plan:   Return in about 13 weeks (around 05/24/2020) for (F2F), (Med Mgmt).      Interventional therapies: Planned, scheduled, and/or pending:   None at this time. Claims to have anaphylactic allergy to Local Anesthetics. He was sent for testing and shown/proven not to be allergic to Lidocaine.   Considering:   None at this time. Claims to have anaphylactic allergy to Local Anesthetics. He was sent for testing and shown/proven not to be allergic to Lidocaine.   Palliative PRN treatment(s):   None at this time. Claims to have anaphylactic allergy to Local Anesthetics. He was sent for testing and shown/proven not to be allergic to Lidocaine.        Recent Visits No visits were found meeting these conditions. Showing recent visits within past 90 days and meeting all other requirements Today's Visits Date Type Provider Dept  02/23/20 Office Visit [name], MD [facility]  Showing today's visits and meeting all other requirements Future Appointments No visits were found  meeting these conditions. Showing future appointments within next 90 days and meeting all other requirements  I discussed the assessment and treatment plan with the patient. The patient was provided an opportunity to ask questions and all were answered. The patient agreed with the plan and demonstrated an understanding of the instructions.  Patient advised to call back or seek an in-person evaluation if the symptoms or condition worsens.  Duration of encounter: 35 minutes.  Note by: Gaspar Cola, MD Date: 02/23/2020; Time: 10:31 AM

## 2020-02-23 ENCOUNTER — Telehealth: Payer: Self-pay

## 2020-02-23 ENCOUNTER — Ambulatory Visit: Payer: Medicare Other | Attending: Pain Medicine | Admitting: Pain Medicine

## 2020-02-23 ENCOUNTER — Encounter: Payer: Self-pay | Admitting: Pain Medicine

## 2020-02-23 ENCOUNTER — Other Ambulatory Visit: Payer: Self-pay

## 2020-02-23 VITALS — BP 159/107 | HR 65 | Temp 97.6°F | Resp 18 | Ht 70.0 in | Wt 197.0 lb

## 2020-02-23 DIAGNOSIS — F1120 Opioid dependence, uncomplicated: Secondary | ICD-10-CM

## 2020-02-23 DIAGNOSIS — M5441 Lumbago with sciatica, right side: Secondary | ICD-10-CM

## 2020-02-23 DIAGNOSIS — M5442 Lumbago with sciatica, left side: Secondary | ICD-10-CM | POA: Diagnosis not present

## 2020-02-23 DIAGNOSIS — M79602 Pain in left arm: Secondary | ICD-10-CM | POA: Diagnosis not present

## 2020-02-23 DIAGNOSIS — Z79899 Other long term (current) drug therapy: Secondary | ICD-10-CM | POA: Diagnosis not present

## 2020-02-23 DIAGNOSIS — G8929 Other chronic pain: Secondary | ICD-10-CM | POA: Diagnosis not present

## 2020-02-23 DIAGNOSIS — M5416 Radiculopathy, lumbar region: Secondary | ICD-10-CM | POA: Diagnosis not present

## 2020-02-23 DIAGNOSIS — G894 Chronic pain syndrome: Secondary | ICD-10-CM

## 2020-02-23 DIAGNOSIS — M79601 Pain in right arm: Secondary | ICD-10-CM | POA: Insufficient documentation

## 2020-02-23 DIAGNOSIS — C3492 Malignant neoplasm of unspecified part of left bronchus or lung: Secondary | ICD-10-CM | POA: Diagnosis not present

## 2020-02-23 DIAGNOSIS — M25552 Pain in left hip: Secondary | ICD-10-CM | POA: Diagnosis not present

## 2020-02-23 DIAGNOSIS — M79605 Pain in left leg: Secondary | ICD-10-CM | POA: Insufficient documentation

## 2020-02-23 DIAGNOSIS — M5412 Radiculopathy, cervical region: Secondary | ICD-10-CM | POA: Diagnosis not present

## 2020-02-23 MED ORDER — MORPHINE SULFATE 15 MG PO TABS
15.0000 mg | ORAL_TABLET | Freq: Four times a day (QID) | ORAL | 0 refills | Status: DC | PRN
Start: 2020-02-24 — End: 2020-05-22

## 2020-02-23 MED ORDER — MORPHINE SULFATE 15 MG PO TABS: 15.0000 mg | ORAL_TABLET | Freq: Four times a day (QID) | ORAL | 0 refills | Status: DC | PRN

## 2020-02-23 MED ORDER — MORPHINE SULFATE 15 MG PO TABS
15.0000 mg | ORAL_TABLET | Freq: Four times a day (QID) | ORAL | 0 refills | Status: DC | PRN
Start: 2020-03-25 — End: 2020-02-23

## 2020-02-23 MED ORDER — MORPHINE SULFATE 15 MG PO TABS
15.0000 mg | ORAL_TABLET | Freq: Four times a day (QID) | ORAL | 0 refills | Status: DC | PRN
Start: 2020-04-24 — End: 2020-05-22

## 2020-02-23 MED ORDER — MORPHINE SULFATE 15 MG PO TABS
15.0000 mg | ORAL_TABLET | Freq: Four times a day (QID) | ORAL | 0 refills | Status: DC | PRN
Start: 2020-04-24 — End: 2020-02-23

## 2020-02-23 NOTE — Progress Notes (Signed)
Nursing Pain Medication Assessment:  Safety precautions to be maintained throughout the outpatient stay will include: orient to surroundings, keep bed in low position, maintain call bell within reach at all times, provide assistance with transfer out of bed and ambulation.  Medication Inspection Compliance: Pill count conducted under aseptic conditions, in front of the patient. Neither the pills nor the bottle was removed from the patient's sight at any time. Once count was completed pills were immediately returned to the patient in their original bottle.  Medication: Morphine ER (MSContin) Pill/Patch Count: 7 of 120 pills remain Pill/Patch Appearance: Markings consistent with prescribed medication Bottle Appearance: Standard pharmacy container. Clearly labeled. Filled Date:10 / 24 / 2021 Last Medication intake:  Today  Patient states his current bottle broke and he put his pills in the old bottle

## 2020-02-23 NOTE — Patient Instructions (Addendum)
____________________________________________________________________________________________  CBD (cannabidiol) WARNING  Applicable to: All individuals currently taking or considering taking CBD (cannabidiol) and, more important, all patients taking opioid analgesic controlled substances (pain medication). (Example: oxycodone; oxymorphone; hydrocodone; hydromorphone; morphine; methadone; tramadol; tapentadol; fentanyl; buprenorphine; butorphanol; dextromethorphan; meperidine; codeine; etc.)  Legal status: CBD remains a Schedule I drug prohibited for any use. CBD is illegal with one exception. In the [place], CBD has a limited Transport planner (FDA) approval for the treatment of two specific types of epilepsy disorders. Only one CBD product has been approved by the FDA for this purpose: "Epidiolex". FDA is aware that some companies are marketing products containing cannabis and cannabis-derived compounds in ways that violate the [employer], Drug and Cosmetic Act [facility] Act) and that may put the health and safety of consumers at risk. The FDA, a Federal agency, has not enforced the CBD status since 2018.   Legality: Some manufacturers ship CBD products nationally, which is illegal. Often such products are sold online and are therefore available throughout the country. CBD is openly sold in head shops and health food stores in some states where such sales have not been explicitly legalized. Selling unapproved products with unsubstantiated therapeutic claims is not only a violation of the law, but also can put patients at risk, as these products have not been proven to be safe or effective. Federal illegality makes it difficult to conduct research on CBD.  Reference: "FDA Regulation of Cannabis and Cannabis-Derived Products, Including Cannabidiol (CBD)" -  [employer].pt  Warning: CBD is not FDA approved and has not undergo the same manufacturing controls as prescription drugs.  This means that the purity and safety of available CBD may be questionable. Most of the time, despite manufacturer's claims, it is contaminated with THC (delta-9-tetrahydrocannabinol - the chemical in marijuana responsible for the "HIGH").  When this is the case, the [facility] contaminant will trigger a positive urine drug screen (UDS) test for Marijuana (carboxy-THC). Because a positive UDS for any illicit substance is a violation of our medication agreement, your opioid analgesics (pain medicine) may be permanently discontinued.  MORE ABOUT CBD  General Information: CBD  is a derivative of the Marijuana (cannabis sativa) plant discovered in 52. It is one of the 113 identified substances found in Marijuana. It accounts for up to 40% of the plant's extract. As of 2018, preliminary clinical studies on CBD included research for the treatment of anxiety, movement disorders, and pain. CBD is available and consumed in multiple forms, including inhalation of smoke or vapor, as an aerosol spray, and by mouth. It may be supplied as an oil containing CBD, capsules, dried cannabis, or as a liquid solution. CBD is thought not to be as psychoactive as THC (delta-9-tetrahydrocannabinol - the chemical in marijuana responsible for the "HIGH"). Studies suggest that CBD may interact with different biological target receptors in the body, including cannabinoid and other neurotransmitter receptors. As of 2018 the mechanism of action for its biological effects has not been determined.  Side-effects  Adverse reactions: Dry mouth, diarrhea, decreased appetite, fatigue, drowsiness, malaise, weakness, sleep disturbances, and others.  Drug interactions: CBC may interact with other medications  such as blood-thinners. (Last update: 10/09/2019) ____________________________________________________________________________________________   ____________________________________________________________________________________________  Medication Rules  Purpose: To inform patients, and their family members, of our rules and regulations.  Applies to: All patients receiving prescriptions (written or electronic).  Pharmacy of record: Pharmacy where electronic prescriptions will be sent. If written prescriptions are taken to a different pharmacy, please inform  the nursing staff. The pharmacy listed in the electronic medical record should be the one where you would like electronic prescriptions to be sent.  Electronic prescriptions: In compliance with the [place] (STOP) Act of 2017 (Session Lanny Cramp [phone]), effective March 04, 2018, all controlled substances must be electronically prescribed. Calling prescriptions to the pharmacy will cease to exist.  Prescription refills: Only during scheduled appointments. Applies to all prescriptions.  NOTE: The following applies primarily to controlled substances (Opioid* Pain Medications).   Type of encounter (visit): For patients receiving controlled substances, face-to-face visits are required. (Not an option or up to the patient.)  Patient's responsibilities: 1. Pain Pills: Bring all pain pills to every appointment (except for procedure appointments). 2. Pill Bottles: Bring pills in original pharmacy bottle. Always bring the newest bottle. Bring bottle, even if empty. 3. Medication refills: You are responsible for knowing and keeping track of what medications you take and those you need refilled. The day before your appointment: write a list of all prescriptions that need to be refilled. The day of the appointment: give the list to the admitting nurse. Prescriptions will be written only during  appointments. No prescriptions will be written on procedure days. If you forget a medication: it will not be "Called in", "Faxed", or "electronically sent". You will need to get another appointment to get these prescribed. No early refills. Do not call asking to have your prescription filled early. 4. Prescription Accuracy: You are responsible for carefully inspecting your prescriptions before leaving our office. Have the discharge nurse carefully go over each prescription with you, before taking them home. Make sure that your name is accurately spelled, that your address is correct. Check the name and dose of your medication to make sure it is accurate. Check the number of pills, and the written instructions to make sure they are clear and accurate. Make sure that you are given enough medication to last until your next medication refill appointment. 5. Taking Medication: Take medication as prescribed. When it comes to controlled substances, taking less pills or less frequently than prescribed is permitted and encouraged. Never take more pills than instructed. Never take medication more frequently than prescribed.  6. Inform other Doctors: Always inform, all of your healthcare providers, of all the medications you take. 7. Pain Medication from other Providers: You are not allowed to accept any additional pain medication from any other Doctor or Healthcare provider. There are two exceptions to this rule. (see below) In the event that you require additional pain medication, you are responsible for notifying us, as stated below. 8. Cough Medicine: Often these contain an opioid, such as codeine or hydrocodone. Never accept or take cough medicine containing these opioids if you are already taking an opioid* medication. The combination may cause respiratory failure and death. 9. Medication Agreement: You are responsible for carefully reading and following our Medication Agreement. This must be signed before  receiving any prescriptions from our practice. Safely store a copy of your signed Agreement. Violations to the Agreement will result in no further prescriptions. (Additional copies of our Medication Agreement are available upon request.) 10. Laws, Rules, & Regulations: All patients are expected to follow all Federal and [employer], [employer], Rules, [employer]. Ignorance of the Laws does not constitute a valid excuse.  11. Illegal drugs and Controlled Substances: The use of illegal substances (including, but not limited to marijuana and its derivatives) and/or the illegal use of any controlled substances is strictly prohibited.  Violation of this rule may result in the immediate and permanent discontinuation of any and all prescriptions being written by our practice. The use of any illegal substances is prohibited. 12. Adopted CDC guidelines & recommendations: Target dosing levels will be at or below 60 MME/day. Use of benzodiazepines** is not recommended.  Exceptions: There are only two exceptions to the rule of not receiving pain medications from other Healthcare Providers. 1. Exception #1 (Emergencies): In the event of an emergency (i.e.: accident requiring emergency care), you are allowed to receive additional pain medication. However, you are responsible for: As soon as you are able, call our office [phone], at any time of the day or night, and leave a message stating your name, the date and nature of the emergency, and the name and dose of the medication prescribed. In the event that your call is answered by a member of our staff, make sure to document and save the date, time, and the name of the person that took your information.  2. Exception #2 (Planned Surgery): In the event that you are scheduled by another doctor or dentist to have any type of surgery or procedure, you are allowed (for a period no longer than 30 days), to receive additional pain medication, for the acute post-op pain.  However, in this case, you are responsible for picking up a copy of our "Post-op Pain Management for Surgeons" handout, and giving it to your surgeon or dentist. This document is available at our office, and does not require an appointment to obtain it. Simply go to our office during business hours (Monday-Thursday from 8:00 AM to 4:00 PM) (Friday 8:00 AM to 12:00 Noon) or if you have a scheduled appointment with [place], prior to your surgery, and ask for it by name. In addition, you are responsible for: calling our office [phone], at any time of the day or night, and leaving a message stating your name, name of your surgeon, type of surgery, and date of procedure or surgery. Failure to comply with your responsibilities may result in termination of therapy involving the controlled substances.  *Opioid medications include: morphine, codeine, oxycodone, oxymorphone, hydrocodone, hydromorphone, meperidine, tramadol, tapentadol, buprenorphine, fentanyl, methadone. **Benzodiazepine medications include: diazepam (Valium), alprazolam (Xanax), clonazepam (Klonopine), lorazepam (Ativan), clorazepate (Tranxene), chlordiazepoxide (Librium), estazolam (Prosom), oxazepam (Serax), temazepam (Restoril), triazolam (Halcion) (Last updated: 01/31/2020) ____________________________________________________________________________________________   ____________________________________________________________________________________________  Medication Recommendations and Reminders  Applies to: All patients receiving prescriptions (written and/or electronic).  Medication Rules & Regulations: These rules and regulations exist for your safety and that of others. They are not flexible and neither are we. Dismissing or ignoring them will be considered "non-compliance" with medication therapy, resulting in complete and irreversible termination of such therapy. (See document titled "Medication Rules" for more details.) In all  conscience, because of safety reasons, we cannot continue providing a therapy where the patient does not follow instructions.  Pharmacy of record:   Definition: This is the pharmacy where your electronic prescriptions will be sent.   We do not endorse any particular pharmacy, however, we have experienced problems with [facility] not securing enough medication supply for the community.  We do not restrict you in your choice of pharmacy. However, once we write for your prescriptions, we will NOT be re-sending more prescriptions to fix restricted supply problems created by your pharmacy, or your insurance.   The pharmacy listed in the electronic medical record should be the one where you want electronic prescriptions to be sent.  If you  choose to change pharmacy, simply notify our nursing staff.  Recommendations:  Keep all of your pain medications in a safe place, under lock and key, even if you live alone. We will NOT replace lost, stolen, or damaged medication.  After you fill your prescription, take 1 week's worth of pills and put them away in a safe place. You should keep a separate, properly labeled bottle for this purpose. The remainder should be kept in the original bottle. Use this as your primary supply, until it runs out. Once it's gone, then you know that you have 1 week's worth of medicine, and it is time to come in for a prescription refill. If you do this correctly, it is unlikely that you will ever run out of medicine.  To make sure that the above recommendation works, it is very important that you make sure your medication refill appointments are scheduled at least 1 week before you run out of medicine. To do this in an effective manner, make sure that you do not leave the office without scheduling your next medication management appointment. Always ask the nursing staff to show you in your prescription , when your medication will be running out. Then arrange for the receptionist to  get you a return appointment, at least 7 days before you run out of medicine. Do not wait until you have 1 or 2 pills left, to come in. This is very poor planning and does not take into consideration that we may need to cancel appointments due to bad weather, sickness, or emergencies affecting our staff.  DO NOT ACCEPT A "Partial Fill": If for any reason your pharmacy does not have enough pills/tablets to completely fill or refill your prescription, do not allow for a "partial fill". The law allows the pharmacy to complete that prescription within 72 hours, without requiring a new prescription. If they do not fill the rest of your prescription within those 72 hours, you will need a separate prescription to fill the remaining amount, which we will NOT provide. If the reason for the partial fill is your insurance, you will need to talk to the pharmacist about payment alternatives for the remaining tablets, but again, DO NOT ACCEPT A PARTIAL FILL, unless you can trust your pharmacist to obtain the remainder of the pills within 72 hours.  Prescription refills and/or changes in medication(s):   Prescription refills, and/or changes in dose or medication, will be conducted only during scheduled medication management appointments. (Applies to both, written and electronic prescriptions.)  No refills on procedure days. No medication will be changed or started on procedure days. No changes, adjustments, and/or refills will be conducted on a procedure day. Doing so will interfere with the diagnostic portion of the procedure.  No phone refills. No medications will be "called into the pharmacy".  No Fax refills.  No weekend refills.  No Holliday refills.  No after hours refills.  Remember:  Business hours are:  Monday to Thursday 8:00 AM to 4:00 PM Provider's Schedule: [name], MD - Appointments are:  Medication management: Monday and Wednesday 8:00 AM to 4:00 PM Procedure day: Tuesday and  Thursday 7:30 AM to 4:00 PM Gillis Santa, MD - Appointments are:  Medication management: Tuesday and Thursday 8:00 AM to 4:00 PM Procedure day: Monday and Wednesday 7:30 AM to 4:00 PM (Last update: 09/22/2019) ____________________________________________________________________________________________   Dronabinol, THC capsules What is this medicine? DRONABINOL (droe NAB i nol) is used to treat nausea and vomiting caused by cancer treatment,  especially for those patients who do not respond to other medicines. This medicine is also used to increase appetite in AIDS patients. This medicine may be used for other purposes; ask your health care provider or pharmacist if you have questions. COMMON BRAND NAME(S): [name] What should I tell my health care provider before I take this medicine? They need to know if you have any of these conditions:  a history of drug or alcohol abuse  heart disease, including angina or irregular heart rate  high or low blood pressure  dizziness or fainting spells on standing  mental health problems (schizophrenia, mania, depression)  seizures  an unusual or allergic reaction to dronabinol, marijuana, sesame oil, other medicines, foods, dyes, or preservatives  pregnant or trying to get pregnant  breast-feeding How should I use this medicine? Take this medicine by mouth. Follow the directions on the prescription label. Take your doses at regular intervals. Do not take your medicine more often than directed. Talk to your pediatrician regarding the use of this medicine in children. Special care may be needed. Overdosage: If you think you have taken too much of this medicine contact a poison control center or emergency room at once. NOTE: This medicine is only for you. Do not share this medicine with others. What if I miss a dose? If you miss a dose, take it as soon as you can. If it is almost time for your next dose, take only that dose. Do not take double  or extra doses. What may interact with this medicine? Do not take this medicine with any of the following medications:  nabilone This medicine may also interact with the following medications:  alcohol-containing medicines or drinks  amphetamine or other stimulant drugs  antihistamines for allergy, cough and cold  atropine  barbiturates such as phenobarbital  certain medicines for anxiety or sleep  certain medicines for bladder problems like oxybutynin, tolterodine  certain medicines for depression, like amitriptyline, fluoxetine, sertraline  certain medicines for seizures like phenobarbital, primidone  certain medicines for stomach problems like dicyclomine, hyoscyamine  certain medicines for travel sickness like scopolamine  certain medicines for Parkinson's disease like benztropine, trihexyphenidyl  cocaine  disulfiram  general anesthetics like halothane, isoflurane, methoxyflurane, propofol  ipratropium  lithium  local anesthetics like lidocaine, pramoxine, tetracaine  medicines that relax muscles for surgery  narcotic medicines for pain  phenothiazines like chlorpromazine, mesoridazine, prochlorperazine, thioridazine  theophylline This list may not describe all possible interactions. Give your health care provider a list of all the medicines, herbs, non-prescription drugs, or dietary supplements you use. Also tell them if you smoke, drink alcohol, or use illegal drugs. Some items may interact with your medicine. What should I watch for while using this medicine? The first time you take this medicine or have an increase in dose make sure there is a responsible person nearby. You may experience mood changes, easy laughter, or other changes in behavior. You may get drowsy or dizzy. Do not drive, use machinery, or do anything that needs mental alertness until you know how this medicine affects you. Do not stand or sit up quickly, especially if you have low blood  pressure or if you are an older patient. This reduces the risk of dizzy or fainting spells. Alcohol may interfere with the effect of this medicine. Avoid alcoholic drinks. If you are taking this medicine to improve your appetite, this is only part of your therapy. Discuss what you can eat and ways of improving your diet  with your health care professional or nutritionist. Frequent small snacks as well as regular meals can help provide extra calories and protein. Do not smoke marijuana while you are taking this medicine. It is similar to one of the active substances found in marijuana. You are at increased risk of serious heart and/or nervous system side effects if these drugs are used together. What side effects may I notice from receiving this medicine? Side effects that you should report to your doctor or health care professional as soon as possible:  allergic reactions like skin rash, itching or hives, swelling of the face, lips, or tongue  fast, irregular heartbeat  feeling faint or lightheaded, falls  hallucinations  panic reactions  seizures  slurred speech Side effects that usually do not require medical attention (report to your doctor or health care professional if they continue or are bothersome):  anxiety or nervousness  confusion  dizziness  nausea, vomiting or diarrhea  stomach upset  tiredness This list may not describe all possible side effects. Call your doctor for medical advice about side effects. You may report side effects to FDA at 1-800-FDA-1088. Where should I keep my medicine? This medicine may cause accidental overdose and death if taken by other adults, children, or pets. Keep out of the reach of children. This medicine can be abused. Keep your medicine in a safe place to protect it from theft. Do not share this medicine with anyone. Selling or giving away this medicine is dangerous and against the law. Store in a cool place between 8 and 15 degrees C (46  and 59 degrees F) or in a refrigerator. Avoid freezing. Mix any unused medicine with a substance like cat litter or coffee grounds. Then throw the medicine away in a sealed container like a sealed bag or a coffee can with a lid. Do not use the medicine after the expiration date. NOTE: This sheet is a summary. It may not cover all possible information. If you have questions about this medicine, talk to your doctor, pharmacist, or health care provider.  2020 Elsevier/Gold Standard (2015-11-10 12:09:40)

## 2020-02-23 NOTE — Telephone Encounter (Signed)
Cancelled 3 scripts for Morphine with [facility] pharmacy as ordered by Dr Dossie Arbour.  Scripts were resent to [facility].

## 2020-02-27 NOTE — Progress Notes (Signed)
[place]  [phone] [phone]  ID: Kristopher [name] OB: 11-12-1960  MR#: [id number]  EQA#:[id number]  Patient Care Team: [name], DO as PCP - General (Family Medicine) Gavin Pound, CMA (Inactive) as Certified Medical Assistant Telford Nab, RN as Registered Nurse Vanita Ingles, RN as Case Manager (General Practice) [name], MD as Referring Physician (Pain Medicine) [name], MD as Consulting Physician (Oncology)  CHIEF COMPLAINT: Clinical stage IIIa squamous cell 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  INTERVAL HISTORY: Patient returns to clinic today for further evaluation and consideration of cycle 4 of maintenance durvalumab.  He is tolerating his treatments well without significant side effects.  He currently feels well and is asymptomatic.  He has a good appetite and denies weight loss.  He has no neurologic complaints.  He denies any recent fevers or illnesses.  He has no chest pain, shortness of breath, cough, or hemoptysis.  He denies any nausea, vomiting, constipation, or diarrhea.  He has no urinary complaints.  Patient offers no specific complaints today.  REVIEW OF SYSTEMS:   Review of Systems  Constitutional: Negative.  Negative for fever, malaise/fatigue and weight loss.  Respiratory: Negative.  Negative for cough and shortness of breath.   Cardiovascular: Negative.  Negative for chest pain and leg swelling.  Gastrointestinal: Negative.  Negative for abdominal pain.  Genitourinary: Negative.  Negative for dysuria and flank pain.  Musculoskeletal: Negative.  Negative for back pain.  Skin: Negative.  Negative for rash.  Neurological: Negative.  Negative for dizziness, focal weakness, weakness and headaches.  Psychiatric/Behavioral: Negative.  The patient is not nervous/anxious.     As per HPI. Otherwise, a complete review of systems is negative.  PAST MEDICAL HISTORY: Past Medical  History:  Diagnosis Date  . Allergy   . Arthritis    left foot  . Benign hypertensive kidney disease   . Chronic back pain    Four rods in back  . Diabetes mellitus, type 2 ([place])   . Dyspnea   . GERD (gastroesophageal reflux disease)   . Hypertension   . Malignant neoplasm of lung ([place])   . Migraines    daily    PAST SURGICAL HISTORY: Past Surgical History:  Procedure Laterality Date  . APPENDECTOMY    . BACK SURGERY    . COLONOSCOPY WITH PROPOFOL N/A 02/19/2016   Procedure: COLONOSCOPY WITH PROPOFOL;  Surgeon: Lucilla Lame, MD;  Location: [facility];  Service: Endoscopy;  Laterality: N/A;  . COLONOSCOPY WITH PROPOFOL N/A 01/19/2018   Procedure: COLONOSCOPY WITH PROPOFOL;  Surgeon: Lucilla Lame, MD;  Location: [facility];  Service: Endoscopy;  Laterality: N/A;  Diabetic - oral meds  . DG OPERATIVE LEFT HIP ([place] HX)     10/19  . ELECTROMAGNETIC NAVIGATION BROCHOSCOPY Left 11/18/2018   Procedure: ELECTROMAGNETIC NAVIGATION BRONCHOSCOPY;  Surgeon: Tyler Pita, MD;  Location: [facility];  Service: Cardiopulmonary;  Laterality: Left;  . FLEXIBLE BRONCHOSCOPY Bilateral 01/20/2019   Procedure: FLEXIBLE BRONCHOSCOPY;  Surgeon: Ottie Glazier, MD;  Location: [facility];  Service: Thoracic;  Laterality: Bilateral;  . FLEXIBLE BRONCHOSCOPY Bilateral 01/22/2019   Procedure: FLEXIBLE BRONCHOSCOPY;  Surgeon: Ottie Glazier, MD;  Location: [facility];  Service: Thoracic;  Laterality: Bilateral;  . FOOT SURGERY Left    Screws and plates  . JOINT REPLACEMENT Left 12/2017   DR Rudene Christians Hip  . KNEE SURGERY Left    X 2  . LEG SURGERY    . LUNG  CANCER SURGERY    . POLYPECTOMY N/A 02/19/2016   Procedure: POLYPECTOMY;  Surgeon: Lucilla Lame, MD;  Location: [facility];  Service: Endoscopy;  Laterality: N/A;  . POLYPECTOMY  01/19/2018   Procedure: POLYPECTOMY;  Surgeon: Lucilla Lame, MD;  Location: [place];  Service: Endoscopy;;  . PORTACATH PLACEMENT N/A  11/11/2019   Procedure: INSERTION PORT-A-CATH;  Surgeon: Nestor Lewandowsky, MD;  Location: [facility];  Service: General;  Laterality: N/A;  . THORACOTOMY Left 01/14/2019   Procedure: THORACOTOMY MAJOR, LEFT;  Surgeon: Nestor Lewandowsky, MD;  Location: [facility];  Service: General;  Laterality: Left;  . TOTAL HIP ARTHROPLASTY Left 12/02/2017   Procedure: TOTAL HIP ARTHROPLASTY ANTERIOR APPROACH;  Surgeon: Hessie Knows, MD;  Location: [facility];  Service: Orthopedics;  Laterality: Left;  [name] VIDEO BRONCHOSCOPY Left 01/14/2019   Procedure: VIDEO BRONCHOSCOPY WITH FLUORO, LEFT;  Surgeon: Nestor Lewandowsky, MD;  Location: [facility];  Service: General;  Laterality: Left;  [name] VIDEO BRONCHOSCOPY WITH ENDOBRONCHIAL NAVIGATION N/A 10/15/2019   Procedure: VIDEO BRONCHOSCOPY WITH ENDOBRONCHIAL NAVIGATION;  Surgeon: Ottie Glazier, MD;  Location: [facility];  Service: Thoracic;  Laterality: N/A;  . VIDEO BRONCHOSCOPY WITH ENDOBRONCHIAL ULTRASOUND N/A 10/15/2019   Procedure: VIDEO BRONCHOSCOPY WITH ENDOBRONCHIAL ULTRASOUND;  Surgeon: Ottie Glazier, MD;  Location: [facility];  Service: Thoracic;  Laterality: N/A;    FAMILY HISTORY: Family History  Problem Relation Age of Onset  . Cancer Father   . Diabetes Sister   . Thrombosis Sister     ADVANCED DIRECTIVES (Y/N):  N  HEALTH MAINTENANCE: Social History   Tobacco Use  . Smoking status: Current Every Day Smoker    Packs/day: 0.25    Years: 35.00    Pack years: 8.75    Types: Cigarettes    Last attempt to quit: 01/14/2019    Years since quitting: 1.1  . Smokeless tobacco: Never Used  Vaping Use  . Vaping Use: Never used  Substance Use Topics  . Alcohol use: No    Alcohol/week: 0.0 standard drinks  . Drug use: Yes    Types: Oxycodone    Comment: prescribed     Colonoscopy:  PAP:  Bone density:  Lipid panel:  Allergies  Allergen Reactions  . Acetaminophen Swelling  . Aspirin Anaphylaxis  . Epinephrine Anaphylaxis    Does not include albuterol  .  Novocain [Procaine] Anaphylaxis  . Penicillins Anaphylaxis    Has patient had a PCN reaction causing immediate rash, facial/tongue/throat swelling, SOB or lightheadedness with hypotension: Yes Has patient had a PCN reaction causing severe rash involving mucus membranes or skin necrosis: No Has patient had a PCN reaction that required hospitalization: Yes Has patient had a PCN reaction occurring within the last 10 years: No If all of the above answers are "NO", then may proceed with Cephalosporin use.   . Strawberry Extract Anaphylaxis  . Shellfish Allergy Hives and Nausea And Vomiting    Current Outpatient Medications  Medication Sig Dispense Refill  . Accu-Chek FastClix Lancets MISC USE TO TEST BLOOD SUGAR 2X A DAY 100 each 12  . ACCU-CHEK GUIDE test strip USE TO TEST BLOOD SUGAR 2X A DAY 100 strip 12  . albuterol (VENTOLIN HFA) 108 (90 Base) MCG/ACT inhaler Inhale into the lungs.    [name] amLODipine (NORVASC) 5 MG tablet Take 1 tablet (5 mg total) by mouth 2 (two) times daily. 180 tablet 1  . ARNUITY ELLIPTA 100 MCG/ACT AEPB Inhale 1 puff into the lungs daily.     [name] atorvastatin (  LIPITOR) 10 MG tablet Take 1 tablet (10 mg total) by mouth daily at 6 PM. 90 tablet 1  . BREZTRI AEROSPHERE 160-9-4.8 MCG/ACT AERO Inhale 2 puffs into the lungs 2 (two) times daily. Maintenance per pulmonology    . diphenhydrAMINE (BENADRYL) 25 MG tablet Take 50 mg by mouth daily.    . empagliflozin (JARDIANCE) 25 MG TABS tablet Take 1 tablet (25 mg total) by mouth daily before breakfast. 30 tablet 3  . esomeprazole (NEXIUM) 40 MG capsule Take 1 capsule (40 mg total) by mouth daily. 90 capsule 3  . hydrALAZINE (APRESOLINE) 100 MG tablet TAKE 1 TABLET BY MOUTH TWICE A DAY 180 tablet 0  . lidocaine (LIDODERM) 5 % Place 1 patch onto the skin daily. Remove & Discard patch within 12 hours or as directed by MD (Patient taking differently: Place 1 patch onto the skin daily as needed (pain). Remove & Discard patch within 12  hours or as directed by MD) 30 patch 12  . metFORMIN (GLUCOPHAGE) 500 MG tablet TAKE 2 TABLETS (1,000 MG TOTAL) BY MOUTH 2 (TWO) TIMES DAILY WITH A MEAL. 360 tablet 0  . metoprolol tartrate (LOPRESSOR) 50 MG tablet Take 1 tablet (50 mg total) by mouth 2 (two) times daily. 180 tablet 0  . montelukast (SINGULAIR) 10 MG tablet Take 10 mg by mouth daily.    Derrill Memo ON 04/24/2020] morphine (MSIR) 15 MG tablet Take 1 tablet (15 mg total) by mouth every 6 (six) hours as needed for moderate pain or severe pain. Must last 30 days. 120 tablet 0  . Multiple Vitamin (MULTIVITAMIN WITH MINERALS) TABS tablet Take 1 tablet by mouth daily.    . nortriptyline (PAMELOR) 25 MG capsule TAKE 2 CAPSULES (50 MG TOTAL) BY MOUTH AT BEDTIME. 180 capsule 1  . ondansetron (ZOFRAN) 8 MG tablet TAKE 1 TABLET (8 MG TOTAL) BY MOUTH 2 (TWO) TIMES DAILY AS NEEDED FOR REFRACTORY NAUSEA / VOMITING. 60 tablet 1  . pantoprazole (PROTONIX) 40 MG tablet Take 1 tablet (40 mg total) by mouth daily. 15 tablet 2  . Potassium Chloride ER 20 MEQ TBCR TAKE 1 TABLET BY MOUTH EVERY DAY 90 tablet 1  . STIOLTO RESPIMAT 2.5-2.5 MCG/ACT AERS Inhale 2 puffs into the lungs daily. Prn per pulmonology notes    . SUMAtriptan (IMITREX) 50 MG tablet Take 1 tablet (50 mg total) by mouth every 2 (two) hours as needed for migraine. Take 1 tab at onset of migraine. May repeat in 2 hours if headache persists or recurs. 10 tablet 12  . morphine (MSIR) 15 MG tablet Take 1 tablet (15 mg total) by mouth every 6 (six) hours as needed for moderate pain or severe pain. Must last 30 days. 120 tablet 0  . [START ON 03/25/2020] morphine (MSIR) 15 MG tablet Take 1 tablet (15 mg total) by mouth every 6 (six) hours as needed for moderate pain or severe pain. Must last 30 days. 120 tablet 0  . NONFORMULARY OR COMPOUNDED ITEM Apply 1-2 mLs topically 4 (four) times daily as needed. 10% Ketamine/2% Cyclobenzaprine/6% Gabapentin Cream 240 each 12  . nystatin (MYCOSTATIN) 100000  UNIT/ML suspension Take 5 mLs (500,000 Units total) by mouth 4 (four) times daily. (Patient not taking: No sig reported) 240 mL 0  . prochlorperazine (COMPAZINE) 10 MG tablet TAKE 1 TABLET (10 MG TOTAL) BY MOUTH EVERY 6 (SIX) HOURS AS NEEDED (NAUSEA OR VOMITING). (Patient not taking: Reported on 03/01/2020) 60 tablet 2  . sucralfate (CARAFATE) 1 g tablet Take  1 tablet (1 g total) by mouth 3 (three) times daily. Dissolve in 3-4 tbsp warm water, swish and swallow (Patient not taking: No sig reported) 90 tablet 3   No current facility-administered medications for this visit.   Facility-Administered Medications Ordered in Other Visits  Medication Dose Route Frequency Provider Last Rate Last Admin  . 0.9 %  sodium chloride infusion    Continuous PRN Dionne Bucy, CRNA   New Bag at 01/22/19 [phone]  . sodium chloride flush (NS) 0.9 % injection 10 mL  10 mL Intracatheter PRN [name], MD   10 mL at 03/01/20 0953    OBJECTIVE: Vitals:   03/01/20 1014  BP: (!) 155/94  Pulse: 63  Temp: (!) 97.4 F (36.3 C)  SpO2: 99%     Body mass index is 28.75 kg/m.    ECOG FS:0 - Asymptomatic  General: Well-developed, well-nourished, no acute distress. Eyes: Pink conjunctiva, anicteric sclera. HEENT: Normocephalic, moist mucous membranes. Lungs: No audible wheezing or coughing. Heart: Regular rate and rhythm. Abdomen: Soft, nontender, no obvious distention. Musculoskeletal: No edema, cyanosis, or clubbing. Neuro: Alert, answering all questions appropriately. Cranial nerves grossly intact. Skin: No rashes or petechiae noted. Psych: Normal affect.  LAB RESULTS:  Lab Results  Component Value Date   NA 139 03/01/2020   K 3.4 (L) 03/01/2020   CL 104 03/01/2020   CO2 27 03/01/2020   GLUCOSE 125 (H) 03/01/2020   BUN 11 03/01/2020   CREATININE 0.62 03/01/2020   CALCIUM 9.1 03/01/2020   PROT 7.3 03/01/2020   ALBUMIN 4.2 03/01/2020   AST 36 03/01/2020   ALT 25 03/01/2020   ALKPHOS 102  03/01/2020   BILITOT 0.6 03/01/2020   GFRNONAA >60 03/01/2020   GFRAA >60 12/01/2019    Lab Results  Component Value Date   WBC 4.4 03/01/2020   NEUTROABS 2.6 03/01/2020   HGB 12.6 (L) 03/01/2020   HCT 38.5 (L) 03/01/2020   MCV 93.9 03/01/2020   PLT 157 03/01/2020     STUDIES: No results found.  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  PLAN:    1.  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  He had a complicated postoperative course. CT scan results from August 14, 2019 reviewed independently with a suspicious subcarinal lymph node measuring 1.4 cm.  Follow-up PET scan on August 23, 2019 revealed hypermetabolism in lymph node highly suspicious for recurrence.  Biopsy confirmed recurrence increasing patient's stage from IA up to IIIA. Patient completed concurrent XRT and weekly carboplatinum and Taxol on January 05, 2020.  Patient will now receive 1 year of maintenance immunotherapy using durvalumab.  Proceed with cycle 4 of treatment today.  Return to clinic in 2 weeks for treatment only and then in 4 weeks for further evaluation and consideration of cycle 6.   2.  Pain: Chronic and unrelated to his malignancy.  Continue monitoring and treatment per pain clinic. 3.  Nausea: Patient does not complain of this today.  Patient asked if it were okay to take CBD Gummies for nausea.  From an oncology standpoint this would be okay, but patient did state concern that it would show a positive urine test.  He was instructed to let pain clinic know that he was taking this for oncologic purposes. 4.  Elevated LFTs: Resolved. 5.  Dysphagia: Resolved. Continue Magic mouthwash as needed. 6.  Hypokalemia: Mild.  Continue oral potassium supplementation. 7.  Leukopenia: Resolved. 8.  Anemia: Nearly resolved. 9.  Thrombocytopenia: Resolved.  Patient expressed understanding and  was in agreement with this plan. He also understands that He can call clinic at any time with any questions, concerns, or complaints.   Cancer Staging Squamous cell carcinoma of left lung [facility]) Staging form: Lung, AJCC 8th Edition - Clinical stage from 02/12/2019: Stage IIIA (cT1b, cN2, cM0) - Signed by [name], MD on 11/01/2019   [name], MD   03/01/2020 1:20 PM

## 2020-02-28 ENCOUNTER — Encounter: Payer: Self-pay | Admitting: Family Medicine

## 2020-02-28 ENCOUNTER — Ambulatory Visit (INDEPENDENT_AMBULATORY_CARE_PROVIDER_SITE_OTHER): Payer: Medicare Other | Admitting: Family Medicine

## 2020-02-28 ENCOUNTER — Other Ambulatory Visit: Payer: Self-pay

## 2020-02-28 VITALS — BP 149/91 | HR 89 | Temp 99.0°F | Wt 202.0 lb

## 2020-02-28 DIAGNOSIS — E1122 Type 2 diabetes mellitus with diabetic chronic kidney disease: Secondary | ICD-10-CM | POA: Diagnosis not present

## 2020-02-28 DIAGNOSIS — N181 Chronic kidney disease, stage 1: Secondary | ICD-10-CM

## 2020-02-28 LAB — BAYER DCA HB A1C WAIVED: HB A1C (BAYER DCA - WAIVED): 5.2 % (ref <7.0)

## 2020-02-28 NOTE — Progress Notes (Signed)
BP (!) 149/91   Pulse 89   Temp 99 F (37.2 C) (Oral)   Wt 202 lb (91.6 kg)   SpO2 97%   BMI 28.98 kg/m    Subjective:    Patient ID: Kristopher [name], male    DOB: [birth year], 59 y.o.   MRN: [id number]  HPI: Kristopher [name] is a 59 y.o. male  Chief Complaint  Patient presents with  . Diabetes    3 month f/up   DIABETES- has been taking metformin as needed, has not been taking his trulicity because of weight loss with his radiation. Has not been on the medicine for about a month.  Hypoglycemic episodes:yes  50s-60s Polydipsia/polyuria: yes Visual disturbance: no Chest pain: no Paresthesias: no Glucose Monitoring: yes  Accucheck frequency: BID  Fasting glucose: 120 Taking Insulin?: no Blood Pressure Monitoring: a few times a month Retinal Examination: Not up to Date Foot Exam: Up to Date Diabetic Education: Not Completed Pneumovax: Up to Date Influenza: Up to Date Aspirin: yes  Relevant past medical, surgical, family and social history reviewed and updated as indicated. Interim medical history since our last visit reviewed. Allergies and medications reviewed and updated.  Review of Systems  Constitutional: Negative.   Respiratory: Negative.   Cardiovascular: Negative.   Gastrointestinal: Negative.   Musculoskeletal: Negative.   Psychiatric/Behavioral: Negative.     Per HPI unless specifically indicated above     Objective:    BP (!) 149/91   Pulse 89   Temp 99 F (37.2 C) (Oral)   Wt 202 lb (91.6 kg)   SpO2 97%   BMI 28.98 kg/m   Wt Readings from Last 3 Encounters:  02/28/20 202 lb (91.6 kg)  02/23/20 197 lb (89.4 kg)  02/16/20 197 lb (89.4 kg)    Physical Exam Vitals and nursing note reviewed.  Constitutional:      General: He is not in acute distress.    Appearance: Normal appearance. He is not ill-appearing, toxic-appearing or diaphoretic.  HENT:     Head: Normocephalic and atraumatic.     Right Ear: External ear normal.     Left Ear:  External ear normal.     Nose: Nose normal.     Mouth/Throat:     Mouth: Mucous membranes are moist.     Pharynx: Oropharynx is clear.  Eyes:     General: No scleral icterus.       Right eye: No discharge.        Left eye: No discharge.     Extraocular Movements: Extraocular movements intact.     Conjunctiva/sclera: Conjunctivae normal.     Pupils: Pupils are equal, round, and reactive to light.  Cardiovascular:     Rate and Rhythm: Normal rate and regular rhythm.     Pulses: Normal pulses.     Heart sounds: Normal heart sounds. No murmur heard. No friction rub. No gallop.   Pulmonary:     Effort: Pulmonary effort is normal. No respiratory distress.     Breath sounds: Normal breath sounds. No stridor. No wheezing, rhonchi or rales.  Chest:     Chest wall: No tenderness.  Musculoskeletal:        General: Normal range of motion.     Cervical back: Normal range of motion and neck supple.  Skin:    General: Skin is warm and dry.     Capillary Refill: Capillary refill takes less than 2 seconds.     Coloration: Skin is not jaundiced  or pale.     Findings: No bruising, erythema, lesion or rash.  Neurological:     General: No focal deficit present.     Mental Status: He is alert and oriented to person, place, and time. Mental status is at baseline.  Psychiatric:        Mood and Affect: Mood normal.        Behavior: Behavior normal.        Thought Content: Thought content normal.        Judgment: Judgment normal.     Results for orders placed or performed in visit on 02/02/20  T4  Result Value Ref Range   T4, Total 7.0 4.5 - 12.0 ug/dL  TSH  Result Value Ref Range   TSH 0.941 0.350 - 4.500 uIU/mL  Comprehensive metabolic panel  Result Value Ref Range   Sodium 140 135 - 145 mmol/L   Potassium 3.9 3.5 - 5.1 mmol/L   Chloride 106 98 - 111 mmol/L   CO2 25 22 - 32 mmol/L   Glucose, Bld 128 (H) 70 - 99 mg/dL   BUN 7 6 - 20 mg/dL   Creatinine, Ser 0.64 0.61 - 1.24 mg/dL    Calcium 9.0 8.9 - 10.3 mg/dL   Total Protein 7.1 6.5 - 8.1 g/dL   Albumin 3.9 3.5 - 5.0 g/dL   AST 36 15 - 41 U/L   ALT 24 0 - 44 U/L   Alkaline Phosphatase 119 38 - 126 U/L   Total Bilirubin 0.7 0.3 - 1.2 mg/dL   GFR, Estimated >60 >60 mL/min   Anion gap 9 5 - 15  CBC with Differential/Platelet  Result Value Ref Range   WBC 3.9 (L) 4.0 - 10.5 K/uL   RBC 3.43 (L) 4.22 - 5.81 MIL/uL   Hemoglobin 10.9 (L) 13.0 - 17.0 g/dL   HCT 33.6 (L) 39.0 - 52.0 %   MCV 98.0 80.0 - 100.0 fL   MCH 31.8 26.0 - 34.0 pg   MCHC 32.4 30.0 - 36.0 g/dL   RDW 18.0 (H) 11.5 - 15.5 %   Platelets 147 (L) 150 - 400 K/uL   nRBC 0.0 0.0 - 0.2 %   Neutrophils Relative % 50 %   Neutro Abs 2.0 1.7 - 7.7 K/uL   Lymphocytes Relative 38 %   Lymphs Abs 1.5 0.7 - 4.0 K/uL   Monocytes Relative 10 %   Monocytes Absolute 0.4 0.1 - 1.0 K/uL   Eosinophils Relative 1 %   Eosinophils Absolute 0.0 0.0 - 0.5 K/uL   Basophils Relative 1 %   Basophils Absolute 0.0 0.0 - 0.1 K/uL   Immature Granulocytes 0 %   Abs Immature Granulocytes 0.01 0.00 - 0.07 K/uL      Assessment & Plan:   Problem List Items Addressed This Visit      Endocrine   Type 2 diabetes mellitus with stage 1 chronic kidney disease, without long-term current use of insulin ([facility]) - Primary    A1c down to 5.2 with weight loss. Will stop meds and recheck 3 months. Call with any concerns.       Relevant Orders   Bayer DCA Hb A1c Waived       Follow up plan: Return in about 3 months (around 05/28/2020).

## 2020-02-28 NOTE — Assessment & Plan Note (Signed)
A1c down to 5.2 with weight loss. Will stop meds and recheck 3 months. Call with any concerns.

## 2020-03-01 ENCOUNTER — Telehealth: Payer: Medicare Other | Admitting: General Practice

## 2020-03-01 ENCOUNTER — Ambulatory Visit: Payer: Self-pay | Admitting: General Practice

## 2020-03-01 ENCOUNTER — Inpatient Hospital Stay: Payer: Medicare Other

## 2020-03-01 ENCOUNTER — Encounter: Payer: Self-pay | Admitting: Oncology

## 2020-03-01 ENCOUNTER — Inpatient Hospital Stay (HOSPITAL_BASED_OUTPATIENT_CLINIC_OR_DEPARTMENT_OTHER): Payer: Medicare Other | Admitting: Oncology

## 2020-03-01 VITALS — BP 155/94 | HR 63 | Temp 97.4°F | Wt 200.4 lb

## 2020-03-01 DIAGNOSIS — C3492 Malignant neoplasm of unspecified part of left bronchus or lung: Secondary | ICD-10-CM | POA: Diagnosis not present

## 2020-03-01 DIAGNOSIS — Z79899 Other long term (current) drug therapy: Secondary | ICD-10-CM | POA: Diagnosis not present

## 2020-03-01 DIAGNOSIS — F17210 Nicotine dependence, cigarettes, uncomplicated: Secondary | ICD-10-CM | POA: Diagnosis not present

## 2020-03-01 DIAGNOSIS — R110 Nausea: Secondary | ICD-10-CM | POA: Diagnosis not present

## 2020-03-01 DIAGNOSIS — D72819 Decreased white blood cell count, unspecified: Secondary | ICD-10-CM | POA: Diagnosis not present

## 2020-03-01 DIAGNOSIS — E1122 Type 2 diabetes mellitus with diabetic chronic kidney disease: Secondary | ICD-10-CM

## 2020-03-01 DIAGNOSIS — N181 Chronic kidney disease, stage 1: Secondary | ICD-10-CM

## 2020-03-01 DIAGNOSIS — N189 Chronic kidney disease, unspecified: Secondary | ICD-10-CM | POA: Diagnosis not present

## 2020-03-01 DIAGNOSIS — I10 Essential (primary) hypertension: Secondary | ICD-10-CM

## 2020-03-01 DIAGNOSIS — Z832 Family history of diseases of the blood and blood-forming organs and certain disorders involving the immune mechanism: Secondary | ICD-10-CM | POA: Diagnosis not present

## 2020-03-01 DIAGNOSIS — I129 Hypertensive chronic kidney disease with stage 1 through stage 4 chronic kidney disease, or unspecified chronic kidney disease: Secondary | ICD-10-CM | POA: Diagnosis not present

## 2020-03-01 DIAGNOSIS — G8929 Other chronic pain: Secondary | ICD-10-CM | POA: Diagnosis not present

## 2020-03-01 DIAGNOSIS — Z5112 Encounter for antineoplastic immunotherapy: Secondary | ICD-10-CM | POA: Diagnosis not present

## 2020-03-01 DIAGNOSIS — Z809 Family history of malignant neoplasm, unspecified: Secondary | ICD-10-CM | POA: Diagnosis not present

## 2020-03-01 DIAGNOSIS — Z886 Allergy status to analgesic agent status: Secondary | ICD-10-CM | POA: Diagnosis not present

## 2020-03-01 DIAGNOSIS — Z9049 Acquired absence of other specified parts of digestive tract: Secondary | ICD-10-CM | POA: Diagnosis not present

## 2020-03-01 DIAGNOSIS — D649 Anemia, unspecified: Secondary | ICD-10-CM | POA: Diagnosis not present

## 2020-03-01 DIAGNOSIS — E1169 Type 2 diabetes mellitus with other specified complication: Secondary | ICD-10-CM

## 2020-03-01 DIAGNOSIS — E876 Hypokalemia: Secondary | ICD-10-CM | POA: Diagnosis not present

## 2020-03-01 DIAGNOSIS — Z880 Allergy status to penicillin: Secondary | ICD-10-CM | POA: Diagnosis not present

## 2020-03-01 DIAGNOSIS — Z833 Family history of diabetes mellitus: Secondary | ICD-10-CM | POA: Diagnosis not present

## 2020-03-01 DIAGNOSIS — D696 Thrombocytopenia, unspecified: Secondary | ICD-10-CM | POA: Diagnosis not present

## 2020-03-01 DIAGNOSIS — C3432 Malignant neoplasm of lower lobe, left bronchus or lung: Secondary | ICD-10-CM | POA: Diagnosis not present

## 2020-03-01 DIAGNOSIS — Z7984 Long term (current) use of oral hypoglycemic drugs: Secondary | ICD-10-CM | POA: Diagnosis not present

## 2020-03-01 LAB — COMPREHENSIVE METABOLIC PANEL
ALT: 25 U/L (ref 0–44)
AST: 36 U/L (ref 15–41)
Albumin: 4.2 g/dL (ref 3.5–5.0)
Alkaline Phosphatase: 102 U/L (ref 38–126)
Anion gap: 8 (ref 5–15)
BUN: 11 mg/dL (ref 6–20)
CO2: 27 mmol/L (ref 22–32)
Calcium: 9.1 mg/dL (ref 8.9–10.3)
Chloride: 104 mmol/L (ref 98–111)
Creatinine, Ser: 0.62 mg/dL (ref 0.61–1.24)
GFR, Estimated: >60 mL/min (ref >60)
Glucose, Bld: 125 mg/dL — ABNORMAL HIGH (ref 70–99)
Potassium: 3.4 mmol/L — ABNORMAL LOW (ref 3.5–5.1)
Sodium: 139 mmol/L (ref 135–145)
Total Bilirubin: 0.6 mg/dL (ref 0.3–1.2)
Total Protein: 7.3 g/dL (ref 6.5–8.1)

## 2020-03-01 LAB — CBC WITH DIFFERENTIAL/PLATELET
Abs Immature Granulocytes: 0.01 10*3/uL (ref 0.00–0.07)
Basophils Absolute: 0 10*3/uL (ref 0.0–0.1)
Basophils Relative: 1 %
Eosinophils Absolute: 0.2 10*3/uL (ref 0.0–0.5)
Eosinophils Relative: 3 %
HCT: 38.5 % — ABNORMAL LOW (ref 39.0–52.0)
Hemoglobin: 12.6 g/dL — ABNORMAL LOW (ref 13.0–17.0)
Immature Granulocytes: 0 %
Lymphocytes Relative: 29 %
Lymphs Abs: 1.3 10*3/uL (ref 0.7–4.0)
MCH: 30.7 pg (ref 26.0–34.0)
MCHC: 32.7 g/dL (ref 30.0–36.0)
MCV: 93.9 fL (ref 80.0–100.0)
Monocytes Absolute: 0.4 10*3/uL (ref 0.1–1.0)
Monocytes Relative: 8 %
Neutro Abs: 2.6 10*3/uL (ref 1.7–7.7)
Neutrophils Relative %: 59 %
Platelets: 157 10*3/uL (ref 150–400)
RBC: 4.1 MIL/uL — ABNORMAL LOW (ref 4.22–5.81)
RDW: 13.3 % (ref 11.5–15.5)
WBC: 4.4 10*3/uL (ref 4.0–10.5)
nRBC: 0 % (ref 0.0–0.2)

## 2020-03-01 LAB — TSH: TSH: 0.578 u[IU]/mL (ref 0.350–4.500)

## 2020-03-01 MED ORDER — SODIUM CHLORIDE 0.9 % IV SOLN
10.0000 mg/kg | Freq: Once | INTRAVENOUS | Status: AC
  Administered 2020-03-01: 1000 mg via INTRAVENOUS
  Filled 2020-03-01: qty 20

## 2020-03-01 MED ORDER — HEPARIN SOD (PORK) LOCK FLUSH 100 UNIT/ML IV SOLN
INTRAVENOUS | Status: AC
  Filled 2020-03-01: qty 5

## 2020-03-01 MED ORDER — SODIUM CHLORIDE 0.9 % IV SOLN
Freq: Once | INTRAVENOUS | Status: AC
Start: 2020-03-01 — End: 2020-03-01
  Filled 2020-03-01: qty 250

## 2020-03-01 MED ORDER — SODIUM CHLORIDE 0.9% FLUSH
10.0000 mL | INTRAVENOUS | Status: AC | PRN
  Administered 2020-03-01: 10 mL
  Filled 2020-03-01: qty 10

## 2020-03-01 MED ORDER — HEPARIN SOD (PORK) LOCK FLUSH 100 UNIT/ML IV SOLN
500.0000 [IU] | Freq: Once | INTRAVENOUS | Status: AC | PRN
  Administered 2020-03-01: 500 [IU]
  Filled 2020-03-01: qty 5

## 2020-03-01 NOTE — Progress Notes (Signed)
Patient tolerated infusion well, no concerns voiced. Patient discharged. Vitals stable.

## 2020-03-01 NOTE — Chronic Care Management (AMB) (Signed)
Chronic Care Management   Follow Up Note   03/01/2020 Name: Kristopher [name] MRN: [id number] DOB: Mar 16, 1960  Referred by: [name], DO Reason for referral : Chronic Care Management (RNCM Follow up for Chronic Disease Management and Care Coordination )   Kristopher [name] is a 59 y.o. year old male who is a primary care patient of [name], DO. The CCM team was consulted for assistance with chronic disease management and care coordination needs.    Review of patient status, including review of consultants reports, relevant laboratory and other test results, and collaboration with appropriate care team members and the patient's provider was performed as part of comprehensive patient evaluation and provision of chronic care management services.    SDOH (Social Determinants of Health) assessments performed: Yes See Care Plan activities for detailed interventions related to [facility])     Outpatient Encounter Medications as of 03/01/2020  Medication Sig   Accu-Chek FastClix Lancets MISC USE TO TEST BLOOD SUGAR 2X A DAY   ACCU-CHEK GUIDE test strip USE TO TEST BLOOD SUGAR 2X A DAY   albuterol (VENTOLIN HFA) 108 (90 Base) MCG/ACT inhaler Inhale into the lungs.   amLODipine (NORVASC) 5 MG tablet Take 1 tablet (5 mg total) by mouth 2 (two) times daily.   ARNUITY ELLIPTA 100 MCG/ACT AEPB Inhale 1 puff into the lungs daily.    atorvastatin (LIPITOR) 10 MG tablet Take 1 tablet (10 mg total) by mouth daily at 6 PM.   BREZTRI AEROSPHERE 160-9-4.8 MCG/ACT AERO Inhale 2 puffs into the lungs 2 (two) times daily. Maintenance per pulmonology   diphenhydrAMINE (BENADRYL) 25 MG tablet Take 50 mg by mouth daily.   empagliflozin (JARDIANCE) 25 MG TABS tablet Take 1 tablet (25 mg total) by mouth daily before breakfast.   esomeprazole (NEXIUM) 40 MG capsule Take 1 capsule (40 mg total) by mouth daily.   hydrALAZINE (APRESOLINE) 100 MG tablet TAKE 1 TABLET BY MOUTH TWICE A DAY    lidocaine (LIDODERM) 5 % Place 1 patch onto the skin daily. Remove & Discard patch within 12 hours or as directed by MD (Patient taking differently: Place 1 patch onto the skin daily as needed (pain). Remove & Discard patch within 12 hours or as directed by MD)   metFORMIN (GLUCOPHAGE) 500 MG tablet TAKE 2 TABLETS (1,000 MG TOTAL) BY MOUTH 2 (TWO) TIMES DAILY WITH A MEAL.   metoprolol tartrate (LOPRESSOR) 50 MG tablet Take 1 tablet (50 mg total) by mouth 2 (two) times daily.   montelukast (SINGULAIR) 10 MG tablet Take 10 mg by mouth daily.   [START ON 04/24/2020] morphine (MSIR) 15 MG tablet Take 1 tablet (15 mg total) by mouth every 6 (six) hours as needed for moderate pain or severe pain. Must last 30 days.   morphine (MSIR) 15 MG tablet Take 1 tablet (15 mg total) by mouth every 6 (six) hours as needed for moderate pain or severe pain. Must last 30 days.   [START ON 03/25/2020] morphine (MSIR) 15 MG tablet Take 1 tablet (15 mg total) by mouth every 6 (six) hours as needed for moderate pain or severe pain. Must last 30 days.   Multiple Vitamin (MULTIVITAMIN WITH MINERALS) TABS tablet Take 1 tablet by mouth daily.   NONFORMULARY OR COMPOUNDED ITEM Apply 1-2 mLs topically 4 (four) times daily as needed. 10% Ketamine/2% Cyclobenzaprine/6% Gabapentin Cream   nortriptyline (PAMELOR) 25 MG capsule TAKE 2 CAPSULES (50 MG TOTAL) BY MOUTH AT BEDTIME.   nystatin (MYCOSTATIN)  100000 UNIT/ML suspension Take 5 mLs (500,000 Units total) by mouth 4 (four) times daily. (Patient not taking: No sig reported)   ondansetron (ZOFRAN) 8 MG tablet TAKE 1 TABLET (8 MG TOTAL) BY MOUTH 2 (TWO) TIMES DAILY AS NEEDED FOR REFRACTORY NAUSEA / VOMITING.   pantoprazole (PROTONIX) 40 MG tablet Take 1 tablet (40 mg total) by mouth daily.   Potassium Chloride ER 20 MEQ TBCR TAKE 1 TABLET BY MOUTH EVERY DAY   prochlorperazine (COMPAZINE) 10 MG tablet TAKE 1 TABLET (10 MG TOTAL) BY MOUTH EVERY 6 (SIX) HOURS AS NEEDED (NAUSEA  OR VOMITING). (Patient not taking: Reported on 03/01/2020)   STIOLTO RESPIMAT 2.5-2.5 MCG/ACT AERS Inhale 2 puffs into the lungs daily. Prn per pulmonology notes   sucralfate (CARAFATE) 1 g tablet Take 1 tablet (1 g total) by mouth 3 (three) times daily. Dissolve in 3-4 tbsp warm water, swish and swallow (Patient not taking: No sig reported)   SUMAtriptan (IMITREX) 50 MG tablet Take 1 tablet (50 mg total) by mouth every 2 (two) hours as needed for migraine. Take 1 tab at onset of migraine. May repeat in 2 hours if headache persists or recurs.   Facility-Administered Encounter Medications as of 03/01/2020  Medication   0.9 %  sodium chloride infusion   sodium chloride flush (NS) 0.9 % injection 10 mL     Objective:   Goals Addressed              This Visit's Progress     RNCM- I need to stay healthy for my granddaughter (pt-stated)        Current Barriers:   Knowledge Deficits related to basic understanding of hypertension, diabetes,  and hyperlipidemia  pathophysiology and self care management  Knowledge Deficits related to understanding of medications prescribed for management of hypertension and hyperlipidemia   Patient continues to smoke 0.5 packs a day- patient has started back since his surgery and has patches. Knows he needs to quit completely.  Is down to about 3 cigarettes a day  Exercise program limited by need of a new knee brace- the patient is more active- completed  New lung cancer diagnosis- surgery with removal of lung cancer- doing well post surgery. Does not need chemo or radiation.  09-15-2019: The patient has a new area detected. Is waiting for follow up for radiation treatment.  11-17-2019: Starting radiation and chemotherapy on 11-18-2019.  01-05-2020: The patient has had 38 radiation treatments and 8 chemo treatments. Has 2 more radiation treatments and will have another treatment x 12 months.  Will be re-scanned in one month.   Case Manager Clinical Goal(s):    Over the next 120 days, patient will verbalize understanding of plan for diabetes management, hypertension management, HLD management and management of chronic disease processes   Interventions:   Discussed plans with patient for ongoing care management follow up and provided patient with direct contact information for care management team    Evaluation of Heart healthy/ADA diet. The patient is drinking sodas, does not like diet. Education on the effects of foods high in salt and sugar in a patient with HTN and DM.  The patient states he is doing better with his diet. He is eating a lot of oranges. Education on hidden sugars and sodium in foods. The patient is eating "a lot" of salads.  The patient wants to keep his hemoglobin A1C down. The reading in February was 7.2, ^ from 5.8 in August of 2020.  Education on healthy food  choices. The patient knows that corn and carrots have negative impact on blood sugars. The last couple of days readings are 135 and 140. 02-28-2020: The patient states his blood sugars are in range and denies any episodes of hypoglycemia. Still takes blood sugars daily. 60 to 120 is the range he is having. He does not have to  take Metformin now. His hemoglobin A1C was 5.2 on 02-28-2020.  The patient knows what to look for with elevations and lows  Education on the body taking longer to heal when having procedures and teeth pulled due to his DM, HTN and smoking does not help with quick healing processes and increases risk of infection. 03-01-2020: Education on watching for hypoglycemic events and having sugary snacks for episodes of hypoglycemia. The patient said if anything good about the cancer is he lost weight and he has gotten off of the Metformin.   Review of blood pressure readings. The last reading was 120/94 Encouraged to monitor blood pressure and discuss treatment options with pcp.  The patient says he is having a hard time with a dry mouth especially at night. Education  on medications for HTN may be the reason for dry mouth. Will discuss with the pharmacist for review. 03-01-2020: Blood pressures are up and down at this time. Will continue to monitor. The patient has it checked regularly. Education and support given.  o BP Readings from Last 3 Encounters: o  03/01/20 o (!) 155/94 o  02/28/20 o (!) 149/91 o  02/23/20 o (!) 159/107 o     Education of the benefits of smoking cessation and working with the patient to reach a goal of quitting.  The patient is down to 3 cigarettes a day.  The patient is using patches but not smoking with the patches on, does not like the taste of the nicotine gum.  03-01-2020: The patient continues to smoke. The patient says he is trying to quit. Will continue to work with the patient on smoking cessation.   Review of exercise goals. The patient is walking his drive way daily and pushing himself to go further each day. The patient feels better but admits he knows when he has overdone it. He wants to continue to improve his health. His 49 year old granddaughter enjoys going to [facility] and he is learning how to effectively pace his activity.    Review of upcoming appointments. Sees Dr. Wynetta Emery on 05-24-2020.   The patient denies any needs from [facility] team pharmacist and LCSW at this time. Knows assistance is available.    Patient Self Care Activities:   UNABLE to independently manage HTN as evidenced by reported headaches and high b/p numbers.   Checks BP and records as discussed  Unable to follow a hearth healthy/ADA diet   Unable to maintain stable blood sugar readings as evidence of hemoglobin A1C of 7.2  Please see past updates related to this goal by clicking on the "Past Updates" button in the selected goal         RNCM: pt-"the cancer is back" (pt-stated)        CARE PLAN ENTRY (see longitudinal plan of care for additional care plan information)  Current Barriers:   Knowledge Deficits related to reoccurrence of squamous  cell carcinoma of left lung  Care Coordination needs related to radiation treatments  in a patient with reoccurrence of squamous cell carcinoma of left lung (disease states)  Chronic Disease Management support and education needs related to squamous cell carcinoma of  the lung.  Starts radiation and chemotherapy on 11-18-2019. Port a cath placed on 11-11-2019. 01-05-2020: The patient is actively receiving treatments at this time.   Nurse Case Manager Clinical Goal(s):   Over the next 120 days, patient will verbalize understanding of plan for treatment of squamous cell carcinoma of left lung  Over the next 120 days, patient will work with [place], pcp and specialist  to address needs related to reoccurrence of squamous cell carcinoma   Over the next 120 days, patient will demonstrate a decrease in reoccurrence of cancer cells in lung exacerbations as evidenced by completion of radiation and resolution of squamous cell carcinoma  Over the next 120 days, patient will attend all scheduled medical appointments: appointment with pcp on 11-23-2019 and is waiting for specialist to call to start radiation therapy. Starts radiation and chemotherapy on 11-18-2019. 01-05-2020: Has completed 38 radiation treatments and 8 chemo treatments. Has 2 more radiation treatments and then will have additional treatments depending on re-evaluation.   Over the next 120 days, patient will demonstrate improved adherence to prescribed treatment plan for lung cancer as evidenced bycompletion of radiation and remission of lung cancer  Interventions:   Inter-disciplinary care team collaboration (see longitudinal plan of care)  Evaluation of current treatment plan related to squamous cell carcinoma of left lung and patient's adherence to plan as established by provider. The patient received his port a cath on 11-11-2019. He has had educational class and will start radiation and chemotherapy on 01-05-2020.  The patient is optimistic and  feels good about the prognosis of his lung cancer. He has done well with treatments and has had minimal side effects from the radiation and chemo. The patient has lost about 38 pounds.  He has started drinking supplements now.  Will provide him with a protein rich recipe by mail.  03-01-2020: The patient is continuing with his treatments and doing well.  Has not been sick and is tolerating the chemo without side effects. The patient denies any acute distress. The patient has about 15 to 20 more chemo treatments.    Advised patient to contact the specialist for any acute changes in his condition and to follow the recommendations of the oncologist during radiation and chemotherapy.   Provided education to patient re: reoccurrence of lung cancer and taking radiation/chemotherapy.  The patient is optimistic and says the doctors on top of things. The patient told them to cut it out. He did not have to take chemo or radiation last time. He says it is a small area and they are optimistic of success with the treatment regimen with radiation and chemotherapy.    Reviewed medications with patient and discussed compliance  Discussed plans with patient for ongoing care management follow up and provided patient with direct contact information for care management team  Provided patient and/or caregiver with community resources and care guides for help and  information about resources available in the community if needed. No expressed needs at this time but the patient will call for changes.  Advice worker).  Reviewed scheduled/upcoming provider appointments including: appointment with pcp on 05-24-2020 and has several upcoming appointments to see oncologist and have treatments.   Patient Self Care Activities:   Patient verbalizes understanding of plan to treat new reoccurrence of squamous cell carcinoma in left lung  Attends all scheduled provider appointments  Calls provider office for new concerns or  questions  Unable to independently manage squamous cell carcinoma of left lung. No longer in remission.  Please see past updates related to this goal by clicking on the "Past Updates" button in the selected goal           Plan:   Telephone follow up appointment with care management team member scheduled for: 04-19-2020 at 230pm   [place], MSN, [facility] Mobile: [phone]

## 2020-03-01 NOTE — Patient Instructions (Signed)
Visit Information  Goals Addressed              This Visit's Progress   .  RNCM- I need to stay healthy for my granddaughter (pt-stated)        Current Barriers:  [name] Knowledge Deficits related to basic understanding of hypertension, diabetes,  and hyperlipidemia  pathophysiology and self care management . Knowledge Deficits related to understanding of medications prescribed for management of hypertension and hyperlipidemia  . Patient continues to smoke 0.5 packs a day- patient has started back since his surgery and has patches. Knows he needs to quit completely.  Is down to about 3 cigarettes a day . Exercise program limited by need of a new knee brace- the patient is more active- completed . New lung cancer diagnosis- surgery with removal of lung cancer- doing well post surgery. Does not need chemo or radiation.  09-15-2019: The patient has a new area detected. Is waiting for follow up for radiation treatment.  11-17-2019: Starting radiation and chemotherapy on 11-18-2019.  01-05-2020: The patient has had 38 radiation treatments and 8 chemo treatments. Has 2 more radiation treatments and will have another treatment x 12 months.  Will be re-scanned in one month.   Case Manager Clinical Goal(s):  [name] Over the next 120 days, patient will verbalize understanding of plan for diabetes management, hypertension management, HLD management and management of chronic disease processes   Interventions:  . Discussed plans with patient for ongoing care management follow up and provided patient with direct contact information for care management team   . Evaluation of Heart healthy/ADA diet. The patient is drinking sodas, does not like diet. Education on the effects of foods high in salt and sugar in a patient with HTN and DM.  The patient states he is doing better with his diet. He is eating a lot of oranges. Education on hidden sugars and sodium in foods. The patient is eating "a lot" of salads.  The patient wants  to keep his hemoglobin A1C down. The reading in February was 7.2, ^ from 5.8 in August of 2020.  Education on healthy food choices. The patient knows that corn and carrots have negative impact on blood sugars. The last couple of days readings are 135 and 140. 02-28-2020: The patient states his blood sugars are in range and denies any episodes of hypoglycemia. Still takes blood sugars daily. 60 to 120 is the range he is having. He does not have to  take Metformin now. His hemoglobin A1C was 5.2 on 02-28-2020.  The patient knows what to look for with elevations and lows . Education on the body taking longer to heal when having procedures and teeth pulled due to his DM, HTN and smoking does not help with quick healing processes and increases risk of infection. 03-01-2020: Education on watching for hypoglycemic events and having sugary snacks for episodes of hypoglycemia. The patient said if anything good about the cancer is he lost weight and he has gotten off of the Metformin.  . Review of blood pressure readings. The last reading was 120/94 Encouraged to monitor blood pressure and discuss treatment options with pcp.  The patient says he is having a hard time with a dry mouth especially at night. Education on medications for HTN may be the reason for dry mouth. Will discuss with the pharmacist for review. 03-01-2020: Blood pressures are up and down at this time. Will continue to monitor. The patient has it checked regularly. Education and support  given.  o BP Readings from Last 3 Encounters: o  03/01/20 o (!) 155/94 o  02/28/20 o (!) 149/91 o  02/23/20 o (!) 159/107 o    . Education of the benefits of smoking cessation and working with the patient to reach a goal of quitting.  The patient is down to 3 cigarettes a day.  The patient is using patches but not smoking with the patches on, does not like the taste of the nicotine gum.  03-01-2020: The patient continues to smoke. The patient says he is trying to  quit. Will continue to work with the patient on smoking cessation.  . Review of exercise goals. The patient is walking his drive way daily and pushing himself to go further each day. The patient feels better but admits he knows when he has overdone it. He wants to continue to improve his health. His 57 year old granddaughter enjoys going to [facility] and he is learning how to effectively pace his activity.   . Review of upcoming appointments. Sees Dr. Wynetta Emery on 05-24-2020.   The patient denies any needs from [facility] team pharmacist and LCSW at this time. Knows assistance is available.    Patient Self Care Activities:  . UNABLE to independently manage HTN as evidenced by reported headaches and high b/p numbers.  . Checks BP and records as discussed . Unable to follow a hearth healthy/ADA diet  . Unable to maintain stable blood sugar readings as evidence of hemoglobin A1C of 7.2  Please see past updates related to this goal by clicking on the "Past Updates" button in the selected goal       .  RNCM: pt-"the cancer is back" (pt-stated)        CARE PLAN ENTRY (see longitudinal plan of care for additional care plan information)  Current Barriers:  [name] Knowledge Deficits related to reoccurrence of squamous cell carcinoma of left lung . Care Coordination needs related to radiation treatments  in a patient with reoccurrence of squamous cell carcinoma of left lung (disease states) . Chronic Disease Management support and education needs related to squamous cell carcinoma of the lung.  Starts radiation and chemotherapy on 11-18-2019. Port a cath placed on 11-11-2019. 01-05-2020: The patient is actively receiving treatments at this time.   Nurse Case Manager Clinical Goal(s):  [name] Over the next 120 days, patient will verbalize understanding of plan for treatment of squamous cell carcinoma of left lung . Over the next 120 days, patient will work with RNCM, pcp and specialist  to address needs related to reoccurrence  of squamous cell carcinoma  . Over the next 120 days, patient will demonstrate a decrease in reoccurrence of cancer cells in lung exacerbations as evidenced by completion of radiation and resolution of squamous cell carcinoma . Over the next 120 days, patient will attend all scheduled medical appointments: appointment with pcp on 11-23-2019 and is waiting for specialist to call to start radiation therapy. Starts radiation and chemotherapy on 11-18-2019. 01-05-2020: Has completed 38 radiation treatments and 8 chemo treatments. Has 2 more radiation treatments and then will have additional treatments depending on re-evaluation.  . Over the next 120 days, patient will demonstrate improved adherence to prescribed treatment plan for lung cancer as evidenced bycompletion of radiation and remission of lung cancer  Interventions:  . Inter-disciplinary care team collaboration (see longitudinal plan of care) . Evaluation of current treatment plan related to squamous cell carcinoma of left lung and patient's adherence to plan as established by  provider. The patient received his port a cath on 11-11-2019. He has had educational class and will start radiation and chemotherapy on 01-05-2020.  The patient is optimistic and feels good about the prognosis of his lung cancer. He has done well with treatments and has had minimal side effects from the radiation and chemo. The patient has lost about 38 pounds.  He has started drinking supplements now.  Will provide him with a protein rich recipe by mail.  03-01-2020: The patient is continuing with his treatments and doing well.  Has not been sick and is tolerating the chemo without side effects. The patient denies any acute distress. The patient has about 15 to 20 more chemo treatments.   . Advised patient to contact the specialist for any acute changes in his condition and to follow the recommendations of the oncologist during radiation and chemotherapy.  . Provided education to  patient re: reoccurrence of lung cancer and taking radiation/chemotherapy.  The patient is optimistic and says the doctors on top of things. The patient told them to cut it out. He did not have to take chemo or radiation last time. He says it is a small area and they are optimistic of success with the treatment regimen with radiation and chemotherapy.   . Reviewed medications with patient and discussed compliance . Discussed plans with patient for ongoing care management follow up and provided patient with direct contact information for care management team . Provided patient and/or caregiver with community resources and care guides for help and  information about resources available in the community if needed. No expressed needs at this time but the patient will call for changes.  Advice worker). . Reviewed scheduled/upcoming provider appointments including: appointment with pcp on 05-24-2020 and has several upcoming appointments to see oncologist and have treatments.   Patient Self Care Activities:  . Patient verbalizes understanding of plan to treat new reoccurrence of squamous cell carcinoma in left lung . Attends all scheduled provider appointments . Calls provider office for new concerns or questions . Unable to independently manage squamous cell carcinoma of left lung. No longer in remission.   Please see past updates related to this goal by clicking on the "Past Updates" button in the selected goal         The patient verbalized understanding of instructions, educational materials, and care plan provided today and declined offer to receive copy of patient instructions, educational materials, and care plan.   Telephone follow up appointment with care management team member scheduled for: 04-19-2020 at 2:30 pm  [place], MSN, [facility] Mobile: [phone]

## 2020-03-02 LAB — T4: T4, Total: 7.6 ug/dL (ref 4.5–12.0)

## 2020-03-09 ENCOUNTER — Other Ambulatory Visit: Payer: Self-pay | Admitting: Family Medicine

## 2020-03-09 ENCOUNTER — Other Ambulatory Visit: Payer: Self-pay | Admitting: *Deleted

## 2020-03-09 MED ORDER — ACCU-CHEK GUIDE VI STRP: ORAL_STRIP | 3 refills | Status: DC

## 2020-03-09 MED ORDER — LIDOCAINE-PRILOCAINE 2.5-2.5 % EX CREA: 1.0000 "application " | TOPICAL_CREAM | CUTANEOUS | 1 refills | Status: DC | PRN

## 2020-03-09 MED ORDER — AMLODIPINE BESYLATE 5 MG PO TABS: 5.0000 mg | ORAL_TABLET | Freq: Two times a day (BID) | ORAL | 1 refills | Status: DC

## 2020-03-09 NOTE — Telephone Encounter (Signed)
Notes to clinic:  medication filled by a historical provider Review for refill   Requested Prescriptions  Pending Prescriptions Disp Refills   montelukast (SINGULAIR) 10 MG tablet      Sig: Take 1 tablet (10 mg total) by mouth daily.      Pulmonology:  Leukotriene Inhibitors Passed - 03/09/2020 10:14 AM      Passed - Valid encounter within last 12 months    Recent Outpatient Visits           1 week ago Type 2 diabetes mellitus with stage 1 chronic kidney disease, without long-term current use of insulin ([place])   [place], [name], DO   3 months ago Type 2 diabetes mellitus with stage 1 chronic kidney disease, without long-term current use of insulin ([place])   [place], [name], DO   5 months ago Type 2 diabetes mellitus with stage 1 chronic kidney disease, without long-term current use of insulin ([place])   [place], [name], DO   6 months ago Type 2 diabetes mellitus with stage 1 chronic kidney disease, without long-term current use of insulin ([place])   [place], [name], DO   8 months ago Pain, dental   [facility] [place]ag[place], [place], DO       Future Appointments             In 2 months Johnson, [name], DO [place], [name]   In 5 months  [facility], [name]              Signed Prescriptions Disp Refills   glucose blood (ACCU-CHEK GUIDE) test strip 100 strip 3    Sig: USE TO TEST BLOOD SUGAR 2X A DAY      Endocrinology: Diabetes - Testing Supplies Passed - 03/09/2020 10:14 AM      Passed - Valid encounter within last 12 months    Recent Outpatient Visits           1 week ago Type 2 diabetes mellitus with stage 1 chronic kidney disease, without long-term current use of insulin ([place])   [place], [name], DO   3 months ago Type 2 diabetes mellitus with stage 1 chronic kidney disease, without long-term current use of  insulin ([place])   [place], [name], DO   5 months ago Type 2 diabetes mellitus with stage 1 chronic kidney disease, without long-term current use of insulin ([place])   [place], [name], DO   6 months ago Type 2 diabetes mellitus with stage 1 chronic kidney disease, without long-term current use of insulin ([place])   [place], [name], DO   8 months ago Pain, dental   [facility] [place], [place], DO       Future Appointments             In 2 months Johnson, [name], DO [place], [name]   In 5 months  [place], [name]               amLODipine (NORVASC) 5 MG tablet 180 tablet 1    Sig: Take 1 tablet (5 mg total) by mouth 2 (two) times daily.      Cardiovascular:  Calcium Channel Blockers Failed - 03/09/2020 10:14 AM      Failed - Last BP in normal range    BP  Readings from Last 1 Encounters:  03/01/20 (!) 155/94          Passed - Valid encounter within last 6 months    Recent Outpatient Visits           1 week ago Type 2 diabetes mellitus with stage 1 chronic kidney disease, without long-term current use of insulin ([place])   [place], [name], DO   3 months ago Type 2 diabetes mellitus with stage 1 chronic kidney disease, without long-term current use of insulin ([place])   [place], [name], DO   5 months ago Type 2 diabetes mellitus with stage 1 chronic kidney disease, without long-term current use of insulin ([place])   [place], [name], DO   6 months ago Type 2 diabetes mellitus with stage 1 chronic kidney disease, without long-term current use of insulin ([place])   [place], [name], DO   8 months ago Pain, dental   [facility] [place], [place], DO       Future Appointments             In 2 months Johnson, Barb Merino, DO [place], [place]   In 5 months   [facility], [name]

## 2020-03-09 NOTE — Telephone Encounter (Signed)
Medication Refill - Medication: Accu check test strips, amlodipine, Singulair, metoprolol x2 prescriptions,   Has the patient contacted their pharmacy? Yes.   Pts wife stating that the pharmacy is requesting to have new prescriptions written. Please advise.  (Agent: If no, request that the patient contact the pharmacy for the refill.) (Agent: If yes, when and what did the pharmacy advise?)  Preferred Pharmacy (with phone number or street name):  [id number] [name], [place] [address]  [place] [address]  Phone: [phone] Fax: [phone]  Hours: Not open 24 hours     Agent: Please be advised that RX refills may take up to 3 business days. We ask that you follow-up with your pharmacy.

## 2020-03-15 ENCOUNTER — Other Ambulatory Visit: Payer: Self-pay

## 2020-03-15 ENCOUNTER — Inpatient Hospital Stay: Payer: Medicare Other | Attending: Oncology

## 2020-03-15 VITALS — BP 141/93 | HR 64 | Temp 97.0°F | Resp 19

## 2020-03-15 DIAGNOSIS — R110 Nausea: Secondary | ICD-10-CM | POA: Insufficient documentation

## 2020-03-15 DIAGNOSIS — E1122 Type 2 diabetes mellitus with diabetic chronic kidney disease: Secondary | ICD-10-CM | POA: Diagnosis not present

## 2020-03-15 DIAGNOSIS — Z880 Allergy status to penicillin: Secondary | ICD-10-CM | POA: Diagnosis not present

## 2020-03-15 DIAGNOSIS — Z9049 Acquired absence of other specified parts of digestive tract: Secondary | ICD-10-CM | POA: Diagnosis not present

## 2020-03-15 DIAGNOSIS — N181 Chronic kidney disease, stage 1: Secondary | ICD-10-CM | POA: Insufficient documentation

## 2020-03-15 DIAGNOSIS — Z833 Family history of diabetes mellitus: Secondary | ICD-10-CM | POA: Diagnosis not present

## 2020-03-15 DIAGNOSIS — Z79899 Other long term (current) drug therapy: Secondary | ICD-10-CM | POA: Diagnosis not present

## 2020-03-15 DIAGNOSIS — C3432 Malignant neoplasm of lower lobe, left bronchus or lung: Secondary | ICD-10-CM | POA: Diagnosis not present

## 2020-03-15 DIAGNOSIS — Z5112 Encounter for antineoplastic immunotherapy: Secondary | ICD-10-CM | POA: Diagnosis not present

## 2020-03-15 DIAGNOSIS — C3492 Malignant neoplasm of unspecified part of left bronchus or lung: Secondary | ICD-10-CM

## 2020-03-15 DIAGNOSIS — G8929 Other chronic pain: Secondary | ICD-10-CM | POA: Insufficient documentation

## 2020-03-15 DIAGNOSIS — Z809 Family history of malignant neoplasm, unspecified: Secondary | ICD-10-CM | POA: Diagnosis not present

## 2020-03-15 DIAGNOSIS — M549 Dorsalgia, unspecified: Secondary | ICD-10-CM | POA: Insufficient documentation

## 2020-03-15 DIAGNOSIS — K30 Functional dyspepsia: Secondary | ICD-10-CM | POA: Diagnosis not present

## 2020-03-15 DIAGNOSIS — Z886 Allergy status to analgesic agent status: Secondary | ICD-10-CM | POA: Insufficient documentation

## 2020-03-15 DIAGNOSIS — Z832 Family history of diseases of the blood and blood-forming organs and certain disorders involving the immune mechanism: Secondary | ICD-10-CM | POA: Insufficient documentation

## 2020-03-15 DIAGNOSIS — F17210 Nicotine dependence, cigarettes, uncomplicated: Secondary | ICD-10-CM | POA: Insufficient documentation

## 2020-03-15 DIAGNOSIS — Z7984 Long term (current) use of oral hypoglycemic drugs: Secondary | ICD-10-CM | POA: Insufficient documentation

## 2020-03-15 DIAGNOSIS — R12 Heartburn: Secondary | ICD-10-CM | POA: Insufficient documentation

## 2020-03-15 MED ORDER — HEPARIN SOD (PORK) LOCK FLUSH 100 UNIT/ML IV SOLN
500.0000 [IU] | Freq: Once | INTRAVENOUS | Status: AC | PRN
  Administered 2020-03-15: 500 [IU]
  Filled 2020-03-15: qty 5

## 2020-03-15 MED ORDER — SODIUM CHLORIDE 0.9 % IV SOLN
Freq: Once | INTRAVENOUS | Status: AC
  Filled 2020-03-15: qty 250

## 2020-03-15 MED ORDER — SODIUM CHLORIDE 0.9 % IV SOLN
10.0000 mg/kg | Freq: Once | INTRAVENOUS | Status: AC
  Administered 2020-03-15: 1000 mg via INTRAVENOUS
  Filled 2020-03-15: qty 20

## 2020-03-15 MED ORDER — HEPARIN SOD (PORK) LOCK FLUSH 100 UNIT/ML IV SOLN
INTRAVENOUS | Status: AC
  Filled 2020-03-15: qty 5

## 2020-03-15 NOTE — Progress Notes (Signed)
Stable at discharge 

## 2020-03-25 NOTE — Progress Notes (Signed)
[name]  [phone] [phone]) [phone]  ID: Kristopher [name] OB: 1960/11/17  MR#: [id number]  GLO#:[id number]  Patient Care Team: [name], DO as PCP - General (Family Medicine) Gavin Pound, [place] (Inactive) as Certified Medical Assistant Telford Nab, RN as Registered Nurse Vanita Ingles, RN as Case Manager (General Practice) [name], MD as Referring Physician (Pain Medicine) [name], MD as Consulting Physician (Oncology)  CHIEF COMPLAINT: Clinical stage IIIa squamous cell 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  INTERVAL HISTORY: Patient returns to clinic today for further evaluation and consideration of cycle 6 of maintenance durvalumab. He currently feels well and is asymptomatic. He is tolerating his treatments without significant side effects. He has occasional indigestion.  He has a good appetite and denies weight loss.  He has no neurologic complaints.  He denies any recent fevers or illnesses.  He has no chest pain, shortness of breath, cough, or hemoptysis.  He denies any nausea, vomiting, constipation, or diarrhea.  He has no urinary complaints. Patient offers no further specific complaints today.  REVIEW OF SYSTEMS:   Review of Systems  Constitutional: Negative.  Negative for fever, malaise/fatigue and weight loss.  Respiratory: Negative.  Negative for cough and shortness of breath.   Cardiovascular: Negative.  Negative for chest pain and leg swelling.  Gastrointestinal: Positive for heartburn. Negative for abdominal pain.  Genitourinary: Negative.  Negative for dysuria and flank pain.  Musculoskeletal: Negative.  Negative for back pain.  Skin: Negative.  Negative for rash.  Neurological: Negative.  Negative for dizziness, focal weakness, weakness and headaches.  Psychiatric/Behavioral: Negative.  The patient is not nervous/anxious.     As per HPI. Otherwise, a complete review of systems is  negative.  PAST MEDICAL HISTORY: Past Medical History:  Diagnosis Date  . Allergy   . Arthritis    left foot  . Benign hypertensive kidney disease   . Chronic back pain    Four rods in back  . Diabetes mellitus, type 2 ([name])   . Dyspnea   . GERD (gastroesophageal reflux disease)   . Hypertension   . Malignant neoplasm of lung ([place])   . Migraines    daily    PAST SURGICAL HISTORY: Past Surgical History:  Procedure Laterality Date  . APPENDECTOMY    . BACK SURGERY    . COLONOSCOPY WITH PROPOFOL N/A 02/19/2016   Procedure: COLONOSCOPY WITH PROPOFOL;  Surgeon: Lucilla Lame, MD;  Location: [facility];  Service: Endoscopy;  Laterality: N/A;  . COLONOSCOPY WITH PROPOFOL N/A 01/19/2018   Procedure: COLONOSCOPY WITH PROPOFOL;  Surgeon: Lucilla Lame, MD;  Location: [facility];  Service: Endoscopy;  Laterality: N/A;  Diabetic - oral meds  . DG OPERATIVE LEFT HIP ([place] HX)     10/19  . ELECTROMAGNETIC NAVIGATION BROCHOSCOPY Left 11/18/2018   Procedure: ELECTROMAGNETIC NAVIGATION BRONCHOSCOPY;  Surgeon: Tyler Pita, MD;  Location: [facility];  Service: Cardiopulmonary;  Laterality: Left;  . FLEXIBLE BRONCHOSCOPY Bilateral 01/20/2019   Procedure: FLEXIBLE BRONCHOSCOPY;  Surgeon: Ottie Glazier, MD;  Location: [facility];  Service: Thoracic;  Laterality: Bilateral;  . FLEXIBLE BRONCHOSCOPY Bilateral 01/22/2019   Procedure: FLEXIBLE BRONCHOSCOPY;  Surgeon: Ottie Glazier, MD;  Location: [facility];  Service: Thoracic;  Laterality: Bilateral;  . FOOT SURGERY Left    Screws and plates  . JOINT REPLACEMENT Left 12/2017   DR Rudene Christians Hip  . KNEE SURGERY Left    X 2  . LEG SURGERY    .  LUNG CANCER SURGERY    . POLYPECTOMY N/A 02/19/2016   Procedure: POLYPECTOMY;  Surgeon: Lucilla Lame, MD;  Location: [facility];  Service: Endoscopy;  Laterality: N/A;  . POLYPECTOMY  01/19/2018   Procedure: POLYPECTOMY;  Surgeon: Lucilla Lame, MD;  Location: [facility];   Service: Endoscopy;;  . PORTACATH PLACEMENT N/A 11/11/2019   Procedure: INSERTION PORT-A-CATH;  Surgeon: Nestor Lewandowsky, MD;  Location: [facility];  Service: General;  Laterality: N/A;  . THORACOTOMY Left 01/14/2019   Procedure: THORACOTOMY MAJOR, LEFT;  Surgeon: Nestor Lewandowsky, MD;  Location: [facility];  Service: General;  Laterality: Left;  . TOTAL HIP ARTHROPLASTY Left 12/02/2017   Procedure: TOTAL HIP ARTHROPLASTY ANTERIOR APPROACH;  Surgeon: Hessie Knows, MD;  Location: [facility];  Service: Orthopedics;  Laterality: Left;  [name] VIDEO BRONCHOSCOPY Left 01/14/2019   Procedure: VIDEO BRONCHOSCOPY WITH FLUORO, LEFT;  Surgeon: Nestor Lewandowsky, MD;  Location: [facility];  Service: General;  Laterality: Left;  [name] VIDEO BRONCHOSCOPY WITH ENDOBRONCHIAL NAVIGATION N/A 10/15/2019   Procedure: VIDEO BRONCHOSCOPY WITH ENDOBRONCHIAL NAVIGATION;  Surgeon: Ottie Glazier, MD;  Location: [facility];  Service: Thoracic;  Laterality: N/A;  . VIDEO BRONCHOSCOPY WITH ENDOBRONCHIAL ULTRASOUND N/A 10/15/2019   Procedure: VIDEO BRONCHOSCOPY WITH ENDOBRONCHIAL ULTRASOUND;  Surgeon: Ottie Glazier, MD;  Location: [facility];  Service: Thoracic;  Laterality: N/A;    FAMILY HISTORY: Family History  Problem Relation Age of Onset  . Cancer Father   . Diabetes Sister   . Thrombosis Sister     ADVANCED DIRECTIVES (Y/N):  N  HEALTH MAINTENANCE: Social History   Tobacco Use  . Smoking status: Current Every Day Smoker    Packs/day: 0.25    Years: 35.00    Pack years: 8.75    Types: Cigarettes    Last attempt to quit: 01/14/2019    Years since quitting: 1.2  . Smokeless tobacco: Never Used  Vaping Use  . Vaping Use: Never used  Substance Use Topics  . Alcohol use: No    Alcohol/week: 0.0 standard drinks  . Drug use: Yes    Types: Oxycodone    Comment: prescribed     Colonoscopy:  PAP:  Bone density:  Lipid panel:  Allergies  Allergen Reactions  . Acetaminophen Swelling  . Aspirin Anaphylaxis  . Epinephrine  Anaphylaxis    Does not include albuterol  . Novocain [Procaine] Anaphylaxis  . Penicillins Anaphylaxis    Has patient had a PCN reaction causing immediate rash, facial/tongue/throat swelling, SOB or lightheadedness with hypotension: Yes Has patient had a PCN reaction causing severe rash involving mucus membranes or skin necrosis: No Has patient had a PCN reaction that required hospitalization: Yes Has patient had a PCN reaction occurring within the last 10 years: No If all of the above answers are "NO", then may proceed with Cephalosporin use.   . Strawberry Extract Anaphylaxis  . Shellfish Allergy Hives and Nausea And Vomiting    Current Outpatient Medications  Medication Sig Dispense Refill  . lidocaine-prilocaine (EMLA) cream Apply 1 application topically as needed. Apply small amount to port site at least 1 hour prior to it being accessed, cover with plastic wrap 30 g 1  . Accu-Chek FastClix Lancets MISC USE TO TEST BLOOD SUGAR 2X A DAY 100 each 12  . albuterol (VENTOLIN HFA) 108 (90 Base) MCG/ACT inhaler Inhale into the lungs.    [name] amLODipine (NORVASC) 5 MG tablet Take 1 tablet (5 mg total) by mouth 2 (two) times daily. 180 tablet 1  . ARNUITY  ELLIPTA 100 MCG/ACT AEPB Inhale 1 puff into the lungs daily.     [name] atorvastatin (LIPITOR) 10 MG tablet Take 1 tablet (10 mg total) by mouth daily at 6 PM. 90 tablet 1  . BREZTRI AEROSPHERE 160-9-4.8 MCG/ACT AERO Inhale 2 puffs into the lungs 2 (two) times daily. Maintenance per pulmonology    . diphenhydrAMINE (BENADRYL) 25 MG tablet Take 50 mg by mouth daily.    . empagliflozin (JARDIANCE) 25 MG TABS tablet Take 1 tablet (25 mg total) by mouth daily before breakfast. 30 tablet 3  . esomeprazole (NEXIUM) 40 MG capsule Take 1 capsule (40 mg total) by mouth daily. 90 capsule 3  . glucose blood (ACCU-CHEK GUIDE) test strip USE TO TEST BLOOD SUGAR 2X A DAY 100 strip 3  . hydrALAZINE (APRESOLINE) 100 MG tablet TAKE 1 TABLET BY MOUTH TWICE A DAY 180  tablet 0  . lidocaine (LIDODERM) 5 % Place 1 patch onto the skin daily. Remove & Discard patch within 12 hours or as directed by MD (Patient taking differently: Place 1 patch onto the skin daily as needed (pain). Remove & Discard patch within 12 hours or as directed by MD) 30 patch 12  . metFORMIN (GLUCOPHAGE) 500 MG tablet TAKE 2 TABLETS (1,000 MG TOTAL) BY MOUTH 2 (TWO) TIMES DAILY WITH A MEAL. 360 tablet 0  . metoprolol tartrate (LOPRESSOR) 50 MG tablet Take 1 tablet (50 mg total) by mouth 2 (two) times daily. 180 tablet 0  . montelukast (SINGULAIR) 10 MG tablet Take 10 mg by mouth daily.    Derrill Memo ON 04/24/2020] morphine (MSIR) 15 MG tablet Take 1 tablet (15 mg total) by mouth every 6 (six) hours as needed for moderate pain or severe pain. Must last 30 days. 120 tablet 0  . morphine (MSIR) 15 MG tablet Take 1 tablet (15 mg total) by mouth every 6 (six) hours as needed for moderate pain or severe pain. Must last 30 days. 120 tablet 0  . morphine (MSIR) 15 MG tablet Take 1 tablet (15 mg total) by mouth every 6 (six) hours as needed for moderate pain or severe pain. Must last 30 days. 120 tablet 0  . Multiple Vitamin (MULTIVITAMIN WITH MINERALS) TABS tablet Take 1 tablet by mouth daily.    . NONFORMULARY OR COMPOUNDED ITEM Apply 1-2 mLs topically 4 (four) times daily as needed. 10% Ketamine/2% Cyclobenzaprine/6% Gabapentin Cream 240 each 12  . nortriptyline (PAMELOR) 25 MG capsule TAKE 2 CAPSULES (50 MG TOTAL) BY MOUTH AT BEDTIME. 180 capsule 1  . nystatin (MYCOSTATIN) 100000 UNIT/ML suspension Take 5 mLs (500,000 Units total) by mouth 4 (four) times daily. (Patient not taking: No sig reported) 240 mL 0  . ondansetron (ZOFRAN) 8 MG tablet TAKE 1 TABLET (8 MG TOTAL) BY MOUTH 2 (TWO) TIMES DAILY AS NEEDED FOR REFRACTORY NAUSEA / VOMITING. 60 tablet 1  . pantoprazole (PROTONIX) 40 MG tablet Take 1 tablet (40 mg total) by mouth daily. 15 tablet 2  . Potassium Chloride ER 20 MEQ TBCR TAKE 1 TABLET BY  MOUTH EVERY DAY 90 tablet 1  . prochlorperazine (COMPAZINE) 10 MG tablet TAKE 1 TABLET (10 MG TOTAL) BY MOUTH EVERY 6 (SIX) HOURS AS NEEDED (NAUSEA OR VOMITING). (Patient not taking: Reported on 03/01/2020) 60 tablet 2  . STIOLTO RESPIMAT 2.5-2.5 MCG/ACT AERS Inhale 2 puffs into the lungs daily. Prn per pulmonology notes    . sucralfate (CARAFATE) 1 g tablet Take 1 tablet (1 g total) by mouth 3 (three) times  daily. Dissolve in 3-4 tbsp warm water, swish and swallow (Patient not taking: No sig reported) 90 tablet 3  . SUMAtriptan (IMITREX) 50 MG tablet Take 1 tablet (50 mg total) by mouth every 2 (two) hours as needed for migraine. Take 1 tab at onset of migraine. May repeat in 2 hours if headache persists or recurs. 10 tablet 12   No current facility-administered medications for this visit.   Facility-Administered Medications Ordered in Other Visits  Medication Dose Route Frequency Provider Last Rate Last Admin  . 0.9 %  sodium chloride infusion    Continuous PRN Dionne Bucy, CRNA   New Bag at 01/22/19 [phone]  . sodium chloride flush (NS) 0.9 % injection 10 mL  10 mL Intracatheter PRN [name], MD   10 mL at 03/01/20 0953    OBJECTIVE: Vitals:   03/29/20 1035  BP: 127/87  Pulse: 98  Resp: 16  Temp: 98 F (36.7 C)  SpO2: 98%     Body mass index is 30.29 kg/m.    ECOG FS:0 - Asymptomatic  General: Well-developed, well-nourished, no acute distress. Eyes: Pink conjunctiva, anicteric sclera. HEENT: Normocephalic, moist mucous membranes. Lungs: No audible wheezing or coughing. Heart: Regular rate and rhythm. Abdomen: Soft, nontender, no obvious distention. Musculoskeletal: No edema, cyanosis, or clubbing. Neuro: Alert, answering all questions appropriately. Cranial nerves grossly intact. Skin: No rashes or petechiae noted. Psych: Normal affect.  LAB RESULTS:  Lab Results  Component Value Date   NA 137 03/29/2020   K 3.5 03/29/2020   CL 103 03/29/2020   CO2 26  03/29/2020   GLUCOSE 164 (H) 03/29/2020   BUN 11 03/29/2020   CREATININE 0.57 (L) 03/29/2020   CALCIUM 8.9 03/29/2020   PROT 7.3 03/29/2020   ALBUMIN 4.0 03/29/2020   AST 38 03/29/2020   ALT 36 03/29/2020   ALKPHOS 112 03/29/2020   BILITOT 0.3 03/29/2020   GFRNONAA >60 03/29/2020   GFRAA >60 12/01/2019    Lab Results  Component Value Date   WBC 6.3 03/29/2020   NEUTROABS 3.6 03/29/2020   HGB 13.1 03/29/2020   HCT 38.9 (L) 03/29/2020   MCV 90.5 03/29/2020   PLT 151 03/29/2020     STUDIES: No results found.  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  PLAN:    1.  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  He had a complicated postoperative course. CT scan results from August 14, 2019 reviewed independently with a suspicious subcarinal lymph node measuring 1.4 cm.  Follow-up PET scan on August 23, 2019 revealed hypermetabolism in lymph node highly suspicious for recurrence.  Biopsy confirmed recurrence increasing patient's stage from IA up to IIIA. Patient completed concurrent XRT and weekly carboplatinum and Taxol on January 05, 2020.  Patient will now receive 1 year of maintenance immunotherapy using durvalumab. Proceed with cycle 6 of treatment today. Return to clinic in 2 weeks for treatment only and then in 4 weeks for further evaluation and consideration of cycle 8. 2.  Pain: Chronic and unrelated to his malignancy.  Continue monitoring and treatment per pain clinic. 3.  Nausea: Patient does not complain of this today.  Patient asked if it were okay to take CBD Gummies for nausea.  From an oncology standpoint this would be okay, but patient did state concern that it would show a positive urine test.  He was instructed to let  pain clinic know that he was taking this for oncologic purposes. 4.  Elevated LFTs: Resolved. 5.  Dysphagia: Resolved. Continue  Magic mouthwash as needed. 6.  Hypokalemia: Resolved. Continue oral potassium supplementation. 7.  Leukopenia: Resolved. 8.  Anemia: Resolved. 9.  Thrombocytopenia: Resolved.  I spent a total of 30 minutes reviewing chart data, face-to-face evaluation with the patient, counseling and coordination of care as detailed above.  Patient expressed understanding and was in agreement with this plan. He also understands that He can call clinic at any time with any questions, concerns, or complaints.   Cancer Staging Squamous cell carcinoma of left lung [facility]) Staging form: Lung, AJCC 8th Edition - Clinical stage from 02/12/2019: Stage IIIA (cT1b, cN2, cM0) - Signed by [name], MD on 11/01/2019   [name], MD   03/30/2020 6:38 AM

## 2020-03-29 ENCOUNTER — Inpatient Hospital Stay: Payer: Medicare Other

## 2020-03-29 ENCOUNTER — Inpatient Hospital Stay (HOSPITAL_BASED_OUTPATIENT_CLINIC_OR_DEPARTMENT_OTHER): Payer: Medicare Other | Admitting: Oncology

## 2020-03-29 VITALS — BP 127/87 | HR 98 | Temp 98.0°F | Resp 16 | Wt 211.1 lb

## 2020-03-29 DIAGNOSIS — C3492 Malignant neoplasm of unspecified part of left bronchus or lung: Secondary | ICD-10-CM

## 2020-03-29 DIAGNOSIS — Z9049 Acquired absence of other specified parts of digestive tract: Secondary | ICD-10-CM | POA: Diagnosis not present

## 2020-03-29 DIAGNOSIS — Z833 Family history of diabetes mellitus: Secondary | ICD-10-CM | POA: Diagnosis not present

## 2020-03-29 DIAGNOSIS — Z79899 Other long term (current) drug therapy: Secondary | ICD-10-CM | POA: Diagnosis not present

## 2020-03-29 DIAGNOSIS — Z880 Allergy status to penicillin: Secondary | ICD-10-CM | POA: Diagnosis not present

## 2020-03-29 DIAGNOSIS — M549 Dorsalgia, unspecified: Secondary | ICD-10-CM | POA: Diagnosis not present

## 2020-03-29 DIAGNOSIS — C3432 Malignant neoplasm of lower lobe, left bronchus or lung: Secondary | ICD-10-CM | POA: Diagnosis not present

## 2020-03-29 DIAGNOSIS — R12 Heartburn: Secondary | ICD-10-CM | POA: Diagnosis not present

## 2020-03-29 DIAGNOSIS — Z886 Allergy status to analgesic agent status: Secondary | ICD-10-CM | POA: Diagnosis not present

## 2020-03-29 DIAGNOSIS — F17210 Nicotine dependence, cigarettes, uncomplicated: Secondary | ICD-10-CM | POA: Diagnosis not present

## 2020-03-29 DIAGNOSIS — K30 Functional dyspepsia: Secondary | ICD-10-CM | POA: Diagnosis not present

## 2020-03-29 DIAGNOSIS — N181 Chronic kidney disease, stage 1: Secondary | ICD-10-CM | POA: Diagnosis not present

## 2020-03-29 DIAGNOSIS — Z809 Family history of malignant neoplasm, unspecified: Secondary | ICD-10-CM | POA: Diagnosis not present

## 2020-03-29 DIAGNOSIS — Z5112 Encounter for antineoplastic immunotherapy: Secondary | ICD-10-CM | POA: Diagnosis not present

## 2020-03-29 DIAGNOSIS — Z832 Family history of diseases of the blood and blood-forming organs and certain disorders involving the immune mechanism: Secondary | ICD-10-CM | POA: Diagnosis not present

## 2020-03-29 DIAGNOSIS — G8929 Other chronic pain: Secondary | ICD-10-CM | POA: Diagnosis not present

## 2020-03-29 DIAGNOSIS — Z7984 Long term (current) use of oral hypoglycemic drugs: Secondary | ICD-10-CM | POA: Diagnosis not present

## 2020-03-29 DIAGNOSIS — R110 Nausea: Secondary | ICD-10-CM | POA: Diagnosis not present

## 2020-03-29 DIAGNOSIS — E1122 Type 2 diabetes mellitus with diabetic chronic kidney disease: Secondary | ICD-10-CM | POA: Diagnosis not present

## 2020-03-29 LAB — CBC WITH DIFFERENTIAL/PLATELET
Abs Immature Granulocytes: 0.04 10*3/uL (ref 0.00–0.07)
Basophils Absolute: 0.1 10*3/uL (ref 0.0–0.1)
Basophils Relative: 1 %
Eosinophils Absolute: 0.2 10*3/uL (ref 0.0–0.5)
Eosinophils Relative: 2 %
HCT: 38.9 % — ABNORMAL LOW (ref 39.0–52.0)
Hemoglobin: 13.1 g/dL (ref 13.0–17.0)
Immature Granulocytes: 1 %
Lymphocytes Relative: 31 %
Lymphs Abs: 1.9 10*3/uL (ref 0.7–4.0)
MCH: 30.5 pg (ref 26.0–34.0)
MCHC: 33.7 g/dL (ref 30.0–36.0)
MCV: 90.5 fL (ref 80.0–100.0)
Monocytes Absolute: 0.5 10*3/uL (ref 0.1–1.0)
Monocytes Relative: 8 %
Neutro Abs: 3.6 10*3/uL (ref 1.7–7.7)
Neutrophils Relative %: 57 %
Platelets: 151 10*3/uL (ref 150–400)
RBC: 4.3 MIL/uL (ref 4.22–5.81)
RDW: 12.9 % (ref 11.5–15.5)
WBC: 6.3 10*3/uL (ref 4.0–10.5)
nRBC: 0 % (ref 0.0–0.2)

## 2020-03-29 LAB — COMPREHENSIVE METABOLIC PANEL
ALT: 36 U/L (ref 0–44)
AST: 38 U/L (ref 15–41)
Albumin: 4 g/dL (ref 3.5–5.0)
Alkaline Phosphatase: 112 U/L (ref 38–126)
Anion gap: 8 (ref 5–15)
BUN: 11 mg/dL (ref 6–20)
CO2: 26 mmol/L (ref 22–32)
Calcium: 8.9 mg/dL (ref 8.9–10.3)
Chloride: 103 mmol/L (ref 98–111)
Creatinine, Ser: 0.57 mg/dL — ABNORMAL LOW (ref 0.61–1.24)
GFR, Estimated: >60 mL/min (ref >60)
Glucose, Bld: 164 mg/dL — ABNORMAL HIGH (ref 70–99)
Potassium: 3.5 mmol/L (ref 3.5–5.1)
Sodium: 137 mmol/L (ref 135–145)
Total Bilirubin: 0.3 mg/dL (ref 0.3–1.2)
Total Protein: 7.3 g/dL (ref 6.5–8.1)

## 2020-03-29 LAB — TSH: TSH: 1.454 u[IU]/mL (ref 0.350–4.500)

## 2020-03-29 MED ORDER — SODIUM CHLORIDE 0.9% FLUSH
10.0000 mL | Freq: Once | INTRAVENOUS | Status: AC
  Administered 2020-03-29: 10 mL via INTRAVENOUS
  Filled 2020-03-29: qty 10

## 2020-03-29 MED ORDER — SODIUM CHLORIDE 0.9 % IV SOLN
Freq: Once | INTRAVENOUS | Status: AC
  Filled 2020-03-29: qty 250

## 2020-03-29 MED ORDER — DURVALUMAB 500 MG/10ML IV SOLN
10.0000 mg/kg | Freq: Once | INTRAVENOUS | Status: AC
Start: 2020-03-29 — End: 2020-03-29
  Administered 2020-03-29: 1000 mg via INTRAVENOUS
  Filled 2020-03-29: qty 20

## 2020-03-29 MED ORDER — HEPARIN SOD (PORK) LOCK FLUSH 100 UNIT/ML IV SOLN
500.0000 [IU] | Freq: Once | INTRAVENOUS | Status: AC | PRN
  Administered 2020-03-29: 500 [IU]
  Filled 2020-03-29: qty 5

## 2020-03-29 MED ORDER — HEPARIN SOD (PORK) LOCK FLUSH 100 UNIT/ML IV SOLN
INTRAVENOUS | Status: AC
  Filled 2020-03-29: qty 5

## 2020-03-29 NOTE — Progress Notes (Signed)
Pt tolerated treatment well with no signs of complications. VSS. Pt stable for discharge.   Livio Ledwith [employer]

## 2020-03-30 LAB — T4: T4, Total: 8 ug/dL (ref 4.5–12.0)

## 2020-04-12 ENCOUNTER — Inpatient Hospital Stay: Payer: Medicare Other | Attending: Oncology

## 2020-04-12 VITALS — BP 118/79 | HR 77 | Temp 97.0°F | Resp 18 | Wt 212.1 lb

## 2020-04-12 DIAGNOSIS — E1122 Type 2 diabetes mellitus with diabetic chronic kidney disease: Secondary | ICD-10-CM | POA: Diagnosis not present

## 2020-04-12 DIAGNOSIS — F17210 Nicotine dependence, cigarettes, uncomplicated: Secondary | ICD-10-CM | POA: Insufficient documentation

## 2020-04-12 DIAGNOSIS — Z9049 Acquired absence of other specified parts of digestive tract: Secondary | ICD-10-CM | POA: Diagnosis not present

## 2020-04-12 DIAGNOSIS — N181 Chronic kidney disease, stage 1: Secondary | ICD-10-CM | POA: Diagnosis not present

## 2020-04-12 DIAGNOSIS — Z8249 Family history of ischemic heart disease and other diseases of the circulatory system: Secondary | ICD-10-CM | POA: Diagnosis not present

## 2020-04-12 DIAGNOSIS — Z880 Allergy status to penicillin: Secondary | ICD-10-CM | POA: Diagnosis not present

## 2020-04-12 DIAGNOSIS — Z833 Family history of diabetes mellitus: Secondary | ICD-10-CM | POA: Diagnosis not present

## 2020-04-12 DIAGNOSIS — Z7984 Long term (current) use of oral hypoglycemic drugs: Secondary | ICD-10-CM | POA: Diagnosis not present

## 2020-04-12 DIAGNOSIS — Z886 Allergy status to analgesic agent status: Secondary | ICD-10-CM | POA: Insufficient documentation

## 2020-04-12 DIAGNOSIS — C3432 Malignant neoplasm of lower lobe, left bronchus or lung: Secondary | ICD-10-CM | POA: Insufficient documentation

## 2020-04-12 DIAGNOSIS — I129 Hypertensive chronic kidney disease with stage 1 through stage 4 chronic kidney disease, or unspecified chronic kidney disease: Secondary | ICD-10-CM | POA: Diagnosis not present

## 2020-04-12 DIAGNOSIS — R110 Nausea: Secondary | ICD-10-CM | POA: Diagnosis not present

## 2020-04-12 DIAGNOSIS — R7989 Other specified abnormal findings of blood chemistry: Secondary | ICD-10-CM | POA: Diagnosis not present

## 2020-04-12 DIAGNOSIS — Z79899 Other long term (current) drug therapy: Secondary | ICD-10-CM | POA: Diagnosis not present

## 2020-04-12 DIAGNOSIS — G8929 Other chronic pain: Secondary | ICD-10-CM | POA: Diagnosis not present

## 2020-04-12 DIAGNOSIS — Z809 Family history of malignant neoplasm, unspecified: Secondary | ICD-10-CM | POA: Insufficient documentation

## 2020-04-12 DIAGNOSIS — E876 Hypokalemia: Secondary | ICD-10-CM | POA: Diagnosis not present

## 2020-04-12 DIAGNOSIS — Z5112 Encounter for antineoplastic immunotherapy: Secondary | ICD-10-CM | POA: Insufficient documentation

## 2020-04-12 DIAGNOSIS — C3412 Malignant neoplasm of upper lobe, left bronchus or lung: Secondary | ICD-10-CM | POA: Diagnosis not present

## 2020-04-12 DIAGNOSIS — C3492 Malignant neoplasm of unspecified part of left bronchus or lung: Secondary | ICD-10-CM

## 2020-04-12 MED ORDER — HEPARIN SOD (PORK) LOCK FLUSH 100 UNIT/ML IV SOLN
500.0000 [IU] | Freq: Once | INTRAVENOUS | Status: AC | PRN
  Administered 2020-04-12: 500 [IU]
  Filled 2020-04-12: qty 5

## 2020-04-12 MED ORDER — SODIUM CHLORIDE 0.9 % IV SOLN
Freq: Once | INTRAVENOUS | Status: AC
  Filled 2020-04-12: qty 250

## 2020-04-12 MED ORDER — SODIUM CHLORIDE 0.9 % IV SOLN
10.0000 mg/kg | Freq: Once | INTRAVENOUS | Status: AC
  Administered 2020-04-12: 1000 mg via INTRAVENOUS
  Filled 2020-04-12: qty 20

## 2020-04-12 NOTE — Progress Notes (Signed)
Pt tolerated infusion well. Pt stable at discharge. 

## 2020-04-16 ENCOUNTER — Other Ambulatory Visit: Payer: Self-pay | Admitting: Family Medicine

## 2020-04-19 ENCOUNTER — Telehealth: Payer: Medicare Other | Admitting: General Practice

## 2020-04-19 ENCOUNTER — Ambulatory Visit (INDEPENDENT_AMBULATORY_CARE_PROVIDER_SITE_OTHER): Payer: Medicare Other | Admitting: General Practice

## 2020-04-19 DIAGNOSIS — I10 Essential (primary) hypertension: Secondary | ICD-10-CM | POA: Diagnosis not present

## 2020-04-19 DIAGNOSIS — C3492 Malignant neoplasm of unspecified part of left bronchus or lung: Secondary | ICD-10-CM | POA: Diagnosis not present

## 2020-04-19 NOTE — Chronic Care Management (AMB) (Signed)
Chronic Care Management   CCM RN Visit Note  04/19/2020 Name: Kristopher [name] MRN: [id number] DOB: Jun 23, 1960  Subjective: Kristopher [name] is a 60 y.o. year old male who is a primary care patient of [name], DO. The care management team was consulted for assistance with disease management and care coordination needs.    Engaged with patient by telephone for follow up visit in response to provider referral for case management and/or care coordination services.   Consent to Services:  The patient was given information about Chronic Care Management services, agreed to services, and gave verbal consent prior to initiation of services.  Please see initial visit note for detailed documentation.   Patient agreed to services and verbal consent obtained.   Assessment: Review of patient past medical history, allergies, medications, health status, including review of consultants reports, laboratory and other test data, was performed as part of comprehensive evaluation and provision of chronic care management services.   SDOH (Social Determinants of Health) assessments and interventions performed:    CCM Care Plan  Allergies  Allergen Reactions   Acetaminophen Swelling   Aspirin Anaphylaxis   Epinephrine Anaphylaxis    Does not include albuterol   Novocain [Procaine] Anaphylaxis   Penicillins Anaphylaxis    Has patient had a PCN reaction causing immediate rash, facial/tongue/throat swelling, SOB or lightheadedness with hypotension: Yes Has patient had a PCN reaction causing severe rash involving mucus membranes or skin necrosis: No Has patient had a PCN reaction that required hospitalization: Yes Has patient had a PCN reaction occurring within the last 10 years: No If all of the above answers are "NO", then may proceed with Cephalosporin use.    Strawberry Extract Anaphylaxis   Shellfish Allergy Hives and Nausea And Vomiting    Outpatient Encounter Medications as of  04/19/2020  Medication Sig   lidocaine-prilocaine (EMLA) cream Apply 1 application topically as needed. Apply small amount to port site at least 1 hour prior to it being accessed, cover with plastic wrap   Accu-Chek FastClix Lancets MISC USE TO TEST BLOOD SUGAR 2X A DAY   albuterol (VENTOLIN HFA) 108 (90 Base) MCG/ACT inhaler Inhale into the lungs.   amLODipine (NORVASC) 5 MG tablet Take 1 tablet (5 mg total) by mouth 2 (two) times daily.   ARNUITY ELLIPTA 100 MCG/ACT AEPB Inhale 1 puff into the lungs daily.    atorvastatin (LIPITOR) 10 MG tablet Take 1 tablet (10 mg total) by mouth daily at 6 PM.   BREZTRI AEROSPHERE 160-9-4.8 MCG/ACT AERO Inhale 2 puffs into the lungs 2 (two) times daily. Maintenance per pulmonology   diphenhydrAMINE (BENADRYL) 25 MG tablet Take 50 mg by mouth daily.   empagliflozin (JARDIANCE) 25 MG TABS tablet Take 1 tablet (25 mg total) by mouth daily before breakfast.   esomeprazole (NEXIUM) 40 MG capsule Take 1 capsule (40 mg total) by mouth daily.   glucose blood (ACCU-CHEK GUIDE) test strip USE TO TEST BLOOD SUGAR 2X A DAY   hydrALAZINE (APRESOLINE) 100 MG tablet TAKE 1 TABLET BY MOUTH TWICE A DAY   lidocaine (LIDODERM) 5 % Place 1 patch onto the skin daily. Remove & Discard patch within 12 hours or as directed by MD (Patient taking differently: Place 1 patch onto the skin daily as needed (pain). Remove & Discard patch within 12 hours or as directed by MD)   metFORMIN (GLUCOPHAGE) 500 MG tablet TAKE 2 TABLETS (1,000 MG TOTAL) BY MOUTH 2 (TWO) TIMES DAILY WITH A MEAL.  metoprolol tartrate (LOPRESSOR) 50 MG tablet TAKE 1 TABLET BY MOUTH TWICE A DAY   montelukast (SINGULAIR) 10 MG tablet Take 10 mg by mouth daily.   [START ON 04/24/2020] morphine (MSIR) 15 MG tablet Take 1 tablet (15 mg total) by mouth every 6 (six) hours as needed for moderate pain or severe pain. Must last 30 days.   morphine (MSIR) 15 MG tablet Take 1 tablet (15 mg total) by mouth every 6  (six) hours as needed for moderate pain or severe pain. Must last 30 days.   morphine (MSIR) 15 MG tablet Take 1 tablet (15 mg total) by mouth every 6 (six) hours as needed for moderate pain or severe pain. Must last 30 days.   Multiple Vitamin (MULTIVITAMIN WITH MINERALS) TABS tablet Take 1 tablet by mouth daily.   NONFORMULARY OR COMPOUNDED ITEM Apply 1-2 mLs topically 4 (four) times daily as needed. 10% Ketamine/2% Cyclobenzaprine/6% Gabapentin Cream   nortriptyline (PAMELOR) 25 MG capsule TAKE 2 CAPSULES (50 MG TOTAL) BY MOUTH AT BEDTIME.   nystatin (MYCOSTATIN) 100000 UNIT/ML suspension Take 5 mLs (500,000 Units total) by mouth 4 (four) times daily. (Patient not taking: No sig reported)   ondansetron (ZOFRAN) 8 MG tablet TAKE 1 TABLET (8 MG TOTAL) BY MOUTH 2 (TWO) TIMES DAILY AS NEEDED FOR REFRACTORY NAUSEA / VOMITING.   pantoprazole (PROTONIX) 40 MG tablet Take 1 tablet (40 mg total) by mouth daily.   Potassium Chloride ER 20 MEQ TBCR TAKE 1 TABLET BY MOUTH EVERY DAY   prochlorperazine (COMPAZINE) 10 MG tablet TAKE 1 TABLET (10 MG TOTAL) BY MOUTH EVERY 6 (SIX) HOURS AS NEEDED (NAUSEA OR VOMITING). (Patient not taking: Reported on 03/01/2020)   STIOLTO RESPIMAT 2.5-2.5 MCG/ACT AERS Inhale 2 puffs into the lungs daily. Prn per pulmonology notes   sucralfate (CARAFATE) 1 g tablet Take 1 tablet (1 g total) by mouth 3 (three) times daily. Dissolve in 3-4 tbsp warm water, swish and swallow (Patient not taking: No sig reported)   SUMAtriptan (IMITREX) 50 MG tablet Take 1 tablet (50 mg total) by mouth every 2 (two) hours as needed for migraine. Take 1 tab at onset of migraine. May repeat in 2 hours if headache persists or recurs.   Facility-Administered Encounter Medications as of 04/19/2020  Medication   0.9 %  sodium chloride infusion   sodium chloride flush (NS) 0.9 % injection 10 mL    Patient Active Problem List   Diagnosis Date Noted   Malignant neoplasm of lung ([place])     Goals of care, counseling/discussion 11/01/2019   Pulmonary emphysema ([place]) 08/25/2019   OSA (obstructive sleep apnea) 06/16/2019   History of colonic polyps 02/02/2019   Shortness of breath 02/02/2019   Chronic upper extremity pain (Third area of Pain) (Bilateral) (L>R) 01/19/2019   Squamous cell carcinoma of left lung ([place]) 01/16/2019   Lung mass 01/14/2019   Left lower lobe pulmonary nodule 10/26/2018   History of Allergy to amide type local anesthetic (Lidocaine) 10/21/2018    Class: History of   Hyperlipidemia associated with type 2 diabetes mellitus ([place]) 07/20/2018   Type 2 diabetes mellitus with stage 1 chronic kidney disease, without long-term current use of insulin ([place]) 12/30/2017   S/P hip replacement 12/02/2017   Hip arthritis 10/01/2017   Aortic atherosclerosis ([place]) 08/25/2017   Left-sided weakness 08/13/2017   Musculoskeletal pain, chronic 04/03/2017   History of tobacco abuse 06/24/2016   GERD (gastroesophageal reflux disease) 06/24/2016   Tobacco abuse 06/24/2016   Pharmacologic therapy  Benign neoplasm of ascending colon    Polyp of sigmoid colon    Rectal polyp    Benign hypertensive renal disease 01/22/2016   Migraine 01/22/2016   Chronic pain syndrome 01/11/2016   Chronic sacroiliac joint pain (Bilateral) (L>R) 05/31/2015   Chronic hip pain (Left) 05/31/2015   Lumbar facet syndrome (Location of Primary Source of Pain) (Bilateral) (L>R) 05/31/2015   Chronic lower extremity pain (Secondary area of Pain) (Left) 05/31/2015   Greater occipital neuralgia (Right) 05/31/2015   Retrolisthesis of L5-S1 02/06/2015   Cervical disc herniation (C4-5 and C5-6) 02/06/2015   Lumbar disc herniation (L5-S1) 02/06/2015   Hypokalemia 02/01/2015   Cervical spinal stenosis (C4-5) 01/06/2015   Cervical foraminal stenosis (Bilateral C5-6) 01/06/2015   Chronic low back pain (Primary Area of Pain) (Bilateral) (L>R) 01/05/2015   Lumbar  spondylosis 01/05/2015   Chronic lumbar radicular pain (Secondary area of Pain) (S1 dermatomal) (Left) 01/05/2015   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) 01/05/2015   Chronic neck pain (posterior midline) (Bilateral) (L>R) 01/05/2015   Cervical spondylosis 01/05/2015   Chronic cervical radicular pain (Third area of Pain) (Bilateral) (C5/C6 dermatome) (L>R) 01/05/2015   Long term current use of opiate analgesic 01/05/2015   Long term prescription opiate use 01/05/2015   Opiate use (60 MME/Day) 94/85/4627   Uncomplicated opioid dependence ([place]) 01/05/2015    Conditions to be addressed/monitored:HTN and Lung Cancer   Care Plan : RNCM: Cancer Treatment Phase (Adult)- Lung cancer  Updates made by Vanita Ingles since 04/19/2020 12:00 AM    Problem: TNCM: Cancer treatment plan   Priority: High    Goal: RNCM: Cancer treatment- actie phase of treatment   Priority: High  Note:   Current Barriers:   Knowledge Deficits related to treatment plan and how long treatments will last for current phase of lung cancer treatment  Chronic Disease Management support and education needs related to effective management of lung cancer   Lacks caregiver support.   Unable to independently manage lung cancer to the left lung  Lacks social connections  Does not contact provider office for questions/concerns  Nurse Case Manager Clinical Goal(s):   patient will verbalize understanding of plan for effective management of lung cancer and continuing treatment regimen   patient will work with RNCM, pcp, and specialist  to address needs related to lung cancer and ongoing treatment   patient will demonstrate a decrease in side effects exacerbations as evidenced by maintaining weight, tolerating diet, taking medications to help with potential side effects and working with the CCM team to optimize health and well being  patient will attend all scheduled medical appointments:  05-29-2020 at 0800 am  the patient will demonstrate ongoing self health care management ability as evidenced by completion of chemotherapy and improved condition   Interventions:   1:1 collaboration with [name], DO regarding development and update of comprehensive plan of care as evidenced by provider attestation and co-signature  Inter-disciplinary care team collaboration (see longitudinal plan of care)  Evaluation of current treatment plan related to lung cancer  and patient's adherence to plan as established by provider.  Advised patient to call the office for changes or questions, the patient is seeing specialist regularly   Provided education to patient re: maintaining weight, monitoring for side effects from chemotherapy, safety, adequate rest and working with the CCM team to optimize health and well being   Reviewed scheduled/upcoming provider appointments including: 05-29-2020 at 0800 am  Discussed plans with patient for  ongoing care management follow up and provided patient with direct contact information for care management team  Patient Goals/Self-Care Activities Over the next 120 days, patient will:  - Patient will self administer medications as prescribed Patient will attend all scheduled provider appointments Patient will call pharmacy for medication refills Patient will attend church or other social activities Patient will continue to perform ADL's independently Patient will continue to perform IADL's independently Patient will call provider office for new concerns or questions Patient will work with BSW to address care coordination needs and will continue to work with the clinical team to address health care and disease management related needs.   - alternating periods of activity with rest promoted - coagulation studies reviewed and trended - diet adjustment recommended - fall and injury prevention strategies promoted - gentle oral care promoted -  individualized medical nutrition therapy provided - signs/symptoms of bleeding reviewed  Follow Up Plan: Telephone follow up appointment with care management team member scheduled for: 06-21-2020 at 3:45 pm       Task: RNCM: Implement Strategies to Prevent or Monitor Anemia or Bleeding   Note:   Care Management Activities:    - alternating periods of activity with rest promoted - coagulation studies reviewed and trended - diet adjustment recommended - fall and injury prevention strategies promoted - gentle oral care promoted - individualized medical nutrition therapy provided - signs/symptoms of bleeding reviewed       Care Plan : RNCM: Hypertension (Adult)  Updates made by Vanita Ingles since 04/19/2020 12:00 AM    Problem: RNCM: Hypertension (Hypertension)   Priority: Medium    Long-Range Goal: RNCM: Hypertension Monitored   Priority: Medium  Note:   Objective:   Last practice recorded BP readings:  BP Readings from Last 3 Encounters:  04/12/20 118/79  03/29/20 127/87  03/15/20 (!) 141/93     Most recent eGFR/CrCl: No results found for: EGFR  No components found for: CRCL Current Barriers:   Knowledge Deficits related to basic understanding of hypertension pathophysiology and self care management  Knowledge Deficits related to understanding of medications prescribed for management of hypertension  Lacks social connections  Does not contact provider office for questions/concerns Case Manager Clinical Goal(s):   Over the next 120 days, patient will verbalize understanding of plan for hypertension management  Over the next 120 days, patient will attend all scheduled medical appointments: 05-29-2020   Over the next 120 days, patient will demonstrate improved adherence to prescribed treatment plan for hypertension as evidenced by taking all medications as prescribed, monitoring and recording blood pressure as directed, adhering to low sodium/DASH diet  Over the  next 120 days, patient will demonstrate improved health management independence as evidenced by checking blood pressure as directed and notifying PCP if SBP>160 or DBP > 90, taking all medications as prescribe, and adhering to a low sodium diet as discussed.  Over the next 120 days, patient will verbalize basic understanding of hypertension disease process and self health management plan as evidenced by compliance with medications, diet, and working with the CCM team to optimize health and well being  Interventions:   Collaboration with [name], DO regarding development and update of comprehensive plan of care as evidenced by provider attestation and co-signature  Inter-disciplinary care team collaboration (see longitudinal plan of care)  Evaluation of current treatment plan related to hypertension self management and patient's adherence to plan as established by provider.  Provided education to patient re: stroke prevention, s/s of heart  attack and stroke, DASH diet, complications of uncontrolled blood pressure  Reviewed medications with patient and discussed importance of compliance  Discussed plans with patient for ongoing care management follow up and provided patient with direct contact information for care management team  Advised patient, providing education and rationale, to monitor blood pressure daily and record, calling PCP for findings outside established parameters.   Reviewed scheduled/upcoming provider appointments including: 05-29-2020 at 0800 am Patient Goals/Self-Care Activities  Over the next 120 days, patient will:  - Self administers medications as prescribed Attends all scheduled provider appointments Calls provider office for new concerns, questions, or BP outside discussed parameters Checks BP and records as discussed Follows a low sodium diet/DASH diet - blood pressure trends reviewed - depression screen reviewed - home or ambulatory blood pressure  monitoring encouraged Follow Up Plan: Telephone follow up appointment with care management team member scheduled for: 06-21-2020 at 345 pm   Task: RNCM: Identify and Monitor Blood Pressure Elevation   Note:   Care Management Activities:    - blood pressure trends reviewed - depression screen reviewed - home or ambulatory blood pressure monitoring encouraged         Plan:Telephone follow up appointment with care management team member scheduled for:  06-21-2020 at 345 pm  [place], MSN, [facility] Mobile: [phone]

## 2020-04-19 NOTE — Patient Instructions (Signed)
Visit Information  PATIENT GOALS: Patient Care Plan: RNCM: Cancer Treatment Phase (Adult)- Lung cancer    Problem Identified: TNCM: Cancer treatment plan   Priority: High    Goal: RNCM: Cancer treatment- actie phase of treatment   Priority: High  Note:   Current Barriers:  [name] Knowledge Deficits related to treatment plan and how long treatments will last for current phase of lung cancer treatment . Chronic Disease Management support and education needs related to effective management of lung cancer  . Lacks caregiver support.  . Unable to independently manage lung cancer to the left lung . Lacks social connections . Does not contact provider office for questions/concerns  Nurse Case Manager Clinical Goal(s):  . patient will verbalize understanding of plan for effective management of lung cancer and continuing treatment regimen  . patient will work with RNCM, pcp, and specialist  to address needs related to lung cancer and ongoing treatment  . patient will demonstrate a decrease in side effects exacerbations as evidenced by maintaining weight, tolerating diet, taking medications to help with potential side effects and working with the CCM team to optimize health and well being . patient will attend all scheduled medical appointments: 05-29-2020 at 0800 am . the patient will demonstrate ongoing self health care management ability as evidenced by completion of chemotherapy and improved condition   Interventions:  . 1:1 collaboration with [name], DO regarding development and update of comprehensive plan of care as evidenced by provider attestation and co-signature . Inter-disciplinary care team collaboration (see longitudinal plan of care) . Evaluation of current treatment plan related to lung cancer  and patient's adherence to plan as established by provider. . Advised patient to call the office for changes or questions, the patient is seeing specialist regularly  . Provided  education to patient re: maintaining weight, monitoring for side effects from chemotherapy, safety, adequate rest and working with the CCM team to optimize health and well being  . Reviewed scheduled/upcoming provider appointments including: 05-29-2020 at 0800 am . Discussed plans with patient for ongoing care management follow up and provided patient with direct contact information for care management team  Patient Goals/Self-Care Activities Over the next 120 days, patient will:  - Patient will self administer medications as prescribed Patient will attend all scheduled provider appointments Patient will call pharmacy for medication refills Patient will attend church or other social activities Patient will continue to perform ADL's independently Patient will continue to perform IADL's independently Patient will call provider office for new concerns or questions Patient will work with BSW to address care coordination needs and will continue to work with the clinical team to address health care and disease management related needs.   - alternating periods of activity with rest promoted - coagulation studies reviewed and trended - diet adjustment recommended - fall and injury prevention strategies promoted - gentle oral care promoted - individualized medical nutrition therapy provided - signs/symptoms of bleeding reviewed  Follow Up Plan: Telephone follow up appointment with care management team member scheduled for: 06-21-2020 at 3:45 pm       Task: RNCM: Implement Strategies to Prevent or Monitor Anemia or Bleeding   Note:   Care Management Activities:    - alternating periods of activity with rest promoted - coagulation studies reviewed and trended - diet adjustment recommended - fall and injury prevention strategies promoted - gentle oral care promoted - individualized medical nutrition therapy provided - signs/symptoms of bleeding reviewed       Patient  Care Plan: RNCM:  Hypertension (Adult)    Problem Identified: RNCM: Hypertension (Hypertension)   Priority: Medium    Long-Range Goal: RNCM: Hypertension Monitored   Priority: Medium  Note:   Objective:  . Last practice recorded BP readings:  BP Readings from Last 3 Encounters:  04/12/20 118/79  03/29/20 127/87  03/15/20 (!) 141/93 .   [name] Most recent eGFR/CrCl: No results found for: EGFR  No components found for: CRCL Current Barriers:  [name] Knowledge Deficits related to basic understanding of hypertension pathophysiology and self care management . Knowledge Deficits related to understanding of medications prescribed for management of hypertension . Lacks social connections . Does not contact provider office for questions/concerns Case Manager Clinical Goal(s):  [name] Over the next 120 days, patient will verbalize understanding of plan for hypertension management . Over the next 120 days, patient will attend all scheduled medical appointments: 05-29-2020  . Over the next 120 days, patient will demonstrate improved adherence to prescribed treatment plan for hypertension as evidenced by taking all medications as prescribed, monitoring and recording blood pressure as directed, adhering to low sodium/DASH diet . Over the next 120 days, patient will demonstrate improved health management independence as evidenced by checking blood pressure as directed and notifying PCP if SBP>160 or DBP > 90, taking all medications as prescribe, and adhering to a low sodium diet as discussed. . Over the next 120 days, patient will verbalize basic understanding of hypertension disease process and self health management plan as evidenced by compliance with medications, diet, and working with the CCM team to optimize health and well being  Interventions:  . Collaboration with [name], DO regarding development and update of comprehensive plan of care as evidenced by provider attestation and co-signature . Inter-disciplinary care  team collaboration (see longitudinal plan of care) . Evaluation of current treatment plan related to hypertension self management and patient's adherence to plan as established by provider. . Provided education to patient re: stroke prevention, s/s of heart attack and stroke, DASH diet, complications of uncontrolled blood pressure . Reviewed medications with patient and discussed importance of compliance . Discussed plans with patient for ongoing care management follow up and provided patient with direct contact information for care management team . Advised patient, providing education and rationale, to monitor blood pressure daily and record, calling PCP for findings outside established parameters.  . Reviewed scheduled/upcoming provider appointments including: 05-29-2020 at 0800 am Patient Goals/Self-Care Activities . Over the next 120 days, patient will:  - Self administers medications as prescribed Attends all scheduled provider appointments Calls provider office for new concerns, questions, or BP outside discussed parameters Checks BP and records as discussed Follows a low sodium diet/DASH diet - blood pressure trends reviewed - depression screen reviewed - home or ambulatory blood pressure monitoring encouraged Follow Up Plan: Telephone follow up appointment with care management team member scheduled for: 06-21-2020 at 345 pm   Task: RNCM: Identify and Monitor Blood Pressure Elevation   Note:   Care Management Activities:    - blood pressure trends reviewed - depression screen reviewed - home or ambulatory blood pressure monitoring encouraged         Patient verbalizes understanding of instructions provided today and agrees to view in [place].   Telephone follow up appointment with care management team member scheduled for: 06-21-2020 at 57 pm  Noreene Larsson RN, MSN, [facility] Mobile:  [phone]

## 2020-04-23 NOTE — Progress Notes (Signed)
[place]  [phone] [phone]  ID: Kristopher [name] OB: 1960-10-15  MR#: [id number]  PFX#:[id number]  Patient Care Team: [name], DO as PCP - General (Family Medicine) Gavin Pound, [place] (Inactive) as Certified Medical Assistant Telford Nab, RN as Registered Nurse Vanita Ingles, RN as Case Manager (General Practice) [name], MD as Referring Physician (Pain Medicine) [name], MD as Consulting Physician (Oncology)  CHIEF COMPLAINT: Clinical stage IIIa squamous cell 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  INTERVAL HISTORY: Patient returns to clinic today for further evaluation and consideration of cycle 8 of maintenance durvalumab.  He continues to tolerate his treatments well without significant side effects.  He currently feels well and is asymptomatic.  He has a good appetite and denies weight loss.  He has no neurologic complaints.  He denies any recent fevers or illnesses.  He has no chest pain, shortness of breath, cough, or hemoptysis.  He denies any nausea, vomiting, constipation, or diarrhea.  He has no urinary complaints.  Patient offers no specific complaints today.  REVIEW OF SYSTEMS:   Review of Systems  Constitutional: Negative.  Negative for fever, malaise/fatigue and weight loss.  Respiratory: Negative.  Negative for cough and shortness of breath.   Cardiovascular: Negative.  Negative for chest pain and leg swelling.  Gastrointestinal: Negative.  Negative for abdominal pain and heartburn.  Genitourinary: Negative.  Negative for dysuria and flank pain.  Musculoskeletal: Negative.  Negative for back pain.  Skin: Negative.  Negative for rash.  Neurological: Negative.  Negative for dizziness, focal weakness, weakness and headaches.  Psychiatric/Behavioral: Negative.  The patient is not nervous/anxious.     As per HPI. Otherwise, a complete review of systems is negative.  PAST MEDICAL  HISTORY: Past Medical History:  Diagnosis Date  . Allergy   . Arthritis    left foot  . Benign hypertensive kidney disease   . Chronic back pain    Four rods in back  . Diabetes mellitus, type 2 ([place])   . Dyspnea   . GERD (gastroesophageal reflux disease)   . Hypertension   . Malignant neoplasm of lung ([place])   . Migraines    daily    PAST SURGICAL HISTORY: Past Surgical History:  Procedure Laterality Date  . APPENDECTOMY    . BACK SURGERY    . COLONOSCOPY WITH PROPOFOL N/A 02/19/2016   Procedure: COLONOSCOPY WITH PROPOFOL;  Surgeon: Lucilla Lame, MD;  Location: [place];  Service: Endoscopy;  Laterality: N/A;  . COLONOSCOPY WITH PROPOFOL N/A 01/19/2018   Procedure: COLONOSCOPY WITH PROPOFOL;  Surgeon: Lucilla Lame, MD;  Location: [place];  Service: Endoscopy;  Laterality: N/A;  Diabetic - oral meds  . DG OPERATIVE LEFT HIP ([place] HX)     10/19  . ELECTROMAGNETIC NAVIGATION BROCHOSCOPY Left 11/18/2018   Procedure: ELECTROMAGNETIC NAVIGATION BRONCHOSCOPY;  Surgeon: Tyler Pita, MD;  Location: [facility];  Service: Cardiopulmonary;  Laterality: Left;  . FLEXIBLE BRONCHOSCOPY Bilateral 01/20/2019   Procedure: FLEXIBLE BRONCHOSCOPY;  Surgeon: Ottie Glazier, MD;  Location: [facility];  Service: Thoracic;  Laterality: Bilateral;  . FLEXIBLE BRONCHOSCOPY Bilateral 01/22/2019   Procedure: FLEXIBLE BRONCHOSCOPY;  Surgeon: Ottie Glazier, MD;  Location: [facility];  Service: Thoracic;  Laterality: Bilateral;  . FOOT SURGERY Left    Screws and plates  . JOINT REPLACEMENT Left 12/2017   DR Rudene Christians Hip  . KNEE SURGERY Left    X 2  . LEG SURGERY    .  LUNG CANCER SURGERY    . POLYPECTOMY N/A 02/19/2016   Procedure: POLYPECTOMY;  Surgeon: Lucilla Lame, MD;  Location: [place];  Service: Endoscopy;  Laterality: N/A;  . POLYPECTOMY  01/19/2018   Procedure: POLYPECTOMY;  Surgeon: Lucilla Lame, MD;  Location: [place];  Service: Endoscopy;;  .  PORTACATH PLACEMENT N/A 11/11/2019   Procedure: INSERTION PORT-A-CATH;  Surgeon: Nestor Lewandowsky, MD;  Location: [facility];  Service: General;  Laterality: N/A;  . THORACOTOMY Left 01/14/2019   Procedure: THORACOTOMY MAJOR, LEFT;  Surgeon: Nestor Lewandowsky, MD;  Location: [facility];  Service: General;  Laterality: Left;  . TOTAL HIP ARTHROPLASTY Left 12/02/2017   Procedure: TOTAL HIP ARTHROPLASTY ANTERIOR APPROACH;  Surgeon: Hessie Knows, MD;  Location: [facility];  Service: Orthopedics;  Laterality: Left;  [name] VIDEO BRONCHOSCOPY Left 01/14/2019   Procedure: VIDEO BRONCHOSCOPY WITH FLUORO, LEFT;  Surgeon: Nestor Lewandowsky, MD;  Location: [facility];  Service: General;  Laterality: Left;  [name] VIDEO BRONCHOSCOPY WITH ENDOBRONCHIAL NAVIGATION N/A 10/15/2019   Procedure: VIDEO BRONCHOSCOPY WITH ENDOBRONCHIAL NAVIGATION;  Surgeon: Ottie Glazier, MD;  Location: [facility];  Service: Thoracic;  Laterality: N/A;  . VIDEO BRONCHOSCOPY WITH ENDOBRONCHIAL ULTRASOUND N/A 10/15/2019   Procedure: VIDEO BRONCHOSCOPY WITH ENDOBRONCHIAL ULTRASOUND;  Surgeon: Ottie Glazier, MD;  Location: [facility];  Service: Thoracic;  Laterality: N/A;    FAMILY HISTORY: Family History  Problem Relation Age of Onset  . Cancer Father   . Diabetes Sister   . Thrombosis Sister     ADVANCED DIRECTIVES (Y/N):  N  HEALTH MAINTENANCE: Social History   Tobacco Use  . Smoking status: Current Every Day Smoker    Packs/day: 0.25    Years: 35.00    Pack years: 8.75    Types: Cigarettes    Last attempt to quit: 01/14/2019    Years since quitting: 1.2  . Smokeless tobacco: Never Used  Vaping Use  . Vaping Use: Never used  Substance Use Topics  . Alcohol use: No    Alcohol/week: 0.0 standard drinks  . Drug use: Yes    Types: Oxycodone    Comment: prescribed     Colonoscopy:  PAP:  Bone density:  Lipid panel:  Allergies  Allergen Reactions  . Acetaminophen Swelling  . Aspirin Anaphylaxis  . Epinephrine Anaphylaxis    Does not  include albuterol  . Novocain [Procaine] Anaphylaxis  . Penicillins Anaphylaxis    Has patient had a PCN reaction causing immediate rash, facial/tongue/throat swelling, SOB or lightheadedness with hypotension: Yes Has patient had a PCN reaction causing severe rash involving mucus membranes or skin necrosis: No Has patient had a PCN reaction that required hospitalization: Yes Has patient had a PCN reaction occurring within the last 10 years: No If all of the above answers are "NO", then may proceed with Cephalosporin use.   . Strawberry Extract Anaphylaxis  . Shellfish Allergy Hives and Nausea And Vomiting    Current Outpatient Medications  Medication Sig Dispense Refill  . Accu-Chek FastClix Lancets MISC USE TO TEST BLOOD SUGAR 2X A DAY 100 each 12  . albuterol (VENTOLIN HFA) 108 (90 Base) MCG/ACT inhaler Inhale into the lungs.    [name] amLODipine (NORVASC) 5 MG tablet Take 1 tablet (5 mg total) by mouth 2 (two) times daily. 180 tablet 1  . ARNUITY ELLIPTA 100 MCG/ACT AEPB Inhale 1 puff into the lungs daily.     [name] atorvastatin (LIPITOR) 10 MG tablet Take 1 tablet (10 mg total) by mouth daily at 6 PM.  90 tablet 1  . BREZTRI AEROSPHERE 160-9-4.8 MCG/ACT AERO Inhale 2 puffs into the lungs 2 (two) times daily. Maintenance per pulmonology    . diphenhydrAMINE (BENADRYL) 25 MG tablet Take 50 mg by mouth daily.    . empagliflozin (JARDIANCE) 25 MG TABS tablet Take 1 tablet (25 mg total) by mouth daily before breakfast. 30 tablet 3  . esomeprazole (NEXIUM) 40 MG capsule Take 1 capsule (40 mg total) by mouth daily. 90 capsule 3  . glucose blood (ACCU-CHEK GUIDE) test strip USE TO TEST BLOOD SUGAR 2X A DAY 100 strip 3  . hydrALAZINE (APRESOLINE) 100 MG tablet TAKE 1 TABLET BY MOUTH TWICE A DAY 180 tablet 0  . lidocaine (LIDODERM) 5 % Place 1 patch onto the skin daily. Remove & Discard patch within 12 hours or as directed by MD (Patient taking differently: Place 1 patch onto the skin daily as needed  (pain). Remove & Discard patch within 12 hours or as directed by MD) 30 patch 12  . lidocaine-prilocaine (EMLA) cream Apply 1 application topically as needed. Apply small amount to port site at least 1 hour prior to it being accessed, cover with plastic wrap 30 g 1  . metFORMIN (GLUCOPHAGE) 500 MG tablet TAKE 2 TABLETS (1,000 MG TOTAL) BY MOUTH 2 (TWO) TIMES DAILY WITH A MEAL. 360 tablet 0  . metoprolol tartrate (LOPRESSOR) 50 MG tablet TAKE 1 TABLET BY MOUTH TWICE A DAY 180 tablet 0  . montelukast (SINGULAIR) 10 MG tablet Take 10 mg by mouth daily.    [name] morphine (MSIR) 15 MG tablet Take 1 tablet (15 mg total) by mouth every 6 (six) hours as needed for moderate pain or severe pain. Must last 30 days. 120 tablet 0  . Multiple Vitamin (MULTIVITAMIN WITH MINERALS) TABS tablet Take 1 tablet by mouth daily.    . NONFORMULARY OR COMPOUNDED ITEM Apply 1-2 mLs topically 4 (four) times daily as needed. 10% Ketamine/2% Cyclobenzaprine/6% Gabapentin Cream 240 each 12  . nortriptyline (PAMELOR) 25 MG capsule TAKE 2 CAPSULES (50 MG TOTAL) BY MOUTH AT BEDTIME. 180 capsule 1  . pantoprazole (PROTONIX) 40 MG tablet Take 1 tablet (40 mg total) by mouth daily. 15 tablet 2  . Potassium Chloride ER 20 MEQ TBCR TAKE 1 TABLET BY MOUTH EVERY DAY 90 tablet 1  . STIOLTO RESPIMAT 2.5-2.5 MCG/ACT AERS Inhale 2 puffs into the lungs daily. Prn per pulmonology notes    . SUMAtriptan (IMITREX) 50 MG tablet Take 1 tablet (50 mg total) by mouth every 2 (two) hours as needed for migraine. Take 1 tab at onset of migraine. May repeat in 2 hours if headache persists or recurs. 10 tablet 12  . morphine (MSIR) 15 MG tablet Take 1 tablet (15 mg total) by mouth every 6 (six) hours as needed for moderate pain or severe pain. Must last 30 days. 120 tablet 0  . morphine (MSIR) 15 MG tablet Take 1 tablet (15 mg total) by mouth every 6 (six) hours as needed for moderate pain or severe pain. Must last 30 days. 120 tablet 0  . nystatin (MYCOSTATIN)  100000 UNIT/ML suspension Take 5 mLs (500,000 Units total) by mouth 4 (four) times daily. (Patient not taking: No sig reported) 240 mL 0  . ondansetron (ZOFRAN) 8 MG tablet TAKE 1 TABLET (8 MG TOTAL) BY MOUTH 2 (TWO) TIMES DAILY AS NEEDED FOR REFRACTORY NAUSEA / VOMITING. (Patient not taking: Reported on 04/26/2020) 60 tablet 1  . prochlorperazine (COMPAZINE) 10 MG tablet TAKE 1  TABLET (10 MG TOTAL) BY MOUTH EVERY 6 (SIX) HOURS AS NEEDED (NAUSEA OR VOMITING). (Patient not taking: No sig reported) 60 tablet 2  . sucralfate (CARAFATE) 1 g tablet Take 1 tablet (1 g total) by mouth 3 (three) times daily. Dissolve in 3-4 tbsp warm water, swish and swallow (Patient not taking: No sig reported) 90 tablet 3   No current facility-administered medications for this visit.   Facility-Administered Medications Ordered in Other Visits  Medication Dose Route Frequency Provider Last Rate Last Admin  . 0.9 %  sodium chloride infusion    Continuous PRN Dionne Bucy, CRNA   New Bag at 01/22/19 [phone]  . sodium chloride flush (NS) 0.9 % injection 10 mL  10 mL Intracatheter PRN [name], MD   10 mL at 03/01/20 0953    OBJECTIVE: Vitals:   04/26/20 1105  BP: 116/80  Pulse: 64  Resp: 18  Temp: (!) 96.7 F (35.9 C)     Body mass index is 31.14 kg/m.    ECOG FS:0 - Asymptomatic  General: Well-developed, well-nourished, no acute distress. Eyes: Pink conjunctiva, anicteric sclera. HEENT: Normocephalic, moist mucous membranes. Lungs: No audible wheezing or coughing. Heart: Regular rate and rhythm. Abdomen: Soft, nontender, no obvious distention. Musculoskeletal: No edema, cyanosis, or clubbing. Neuro: Alert, answering all questions appropriately. Cranial nerves grossly intact. Skin: No rashes or petechiae noted. Psych: Normal affect.   LAB RESULTS:  Lab Results  Component Value Date   NA 141 04/26/2020   K 3.3 (L) 04/26/2020   CL 102 04/26/2020   CO2 29 04/26/2020   GLUCOSE 137 (H)  04/26/2020   BUN 10 04/26/2020   CREATININE 0.85 04/26/2020   CALCIUM 9.3 04/26/2020   PROT 7.9 04/26/2020   ALBUMIN 4.5 04/26/2020   AST 25 04/26/2020   ALT 25 04/26/2020   ALKPHOS 120 04/26/2020   BILITOT 0.6 04/26/2020   GFRNONAA >60 04/26/2020   GFRAA >60 12/01/2019    Lab Results  Component Value Date   WBC 5.7 04/26/2020   NEUTROABS 3.0 04/26/2020   HGB 13.7 04/26/2020   HCT 41.7 04/26/2020   MCV 90.3 04/26/2020   PLT 160 04/26/2020     STUDIES: No results found.  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  PLAN:    1.  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  He had a complicated postoperative course. CT scan results from August 14, 2019 reviewed independently with a suspicious subcarinal lymph node measuring 1.4 cm.  Follow-up PET scan on August 23, 2019 revealed hypermetabolism in lymph node highly suspicious for recurrence.  Biopsy confirmed recurrence increasing patient's stage from IA up to IIIA. Patient completed concurrent XRT and weekly carboplatinum and Taxol on January 05, 2020.  Patient will now receive 1 year of maintenance immunotherapy using durvalumab.  Proceed with cycle 8 of treatment today.  Return to clinic in 2 weeks for treatment only and then in 4 weeks for further evaluation and consideration of cycle 10.  Will repeat CT scan after cycle 10.  2.  Pain: Chronic and unrelated to his malignancy.  Continue monitoring and treatment per pain clinic. 3.  Nausea: Patient does not complain of this today.  Patient asked if it were okay to take CBD Gummies for nausea.  From an oncology standpoint this would be okay, but patient did state concern that it would show a positive  urine test.  He was instructed to let pain clinic know that he was taking this for oncologic purposes. 4.  Elevated LFTs: Resolved. 5.  Dysphagia: Resolved. Continue  Magic mouthwash as needed. 6.  Hypokalemia: Mild.  Continue oral potassium supplementation.  I spent a total of 30 minutes reviewing chart data, face-to-face evaluation with the patient, counseling and coordination of care as detailed above.   Patient expressed understanding and was in agreement with this plan. He also understands that He can call clinic at any time with any questions, concerns, or complaints.   Cancer Staging Squamous cell carcinoma of left lung [facility]) Staging form: Lung, AJCC 8th Edition - Clinical stage from 02/12/2019: Stage IIIA (cT1b, cN2, cM0) - Signed by [name], MD on 11/01/2019   [name], MD   04/27/2020 6:39 AM

## 2020-04-26 ENCOUNTER — Other Ambulatory Visit: Payer: Self-pay

## 2020-04-26 ENCOUNTER — Encounter: Payer: Self-pay | Admitting: Oncology

## 2020-04-26 ENCOUNTER — Inpatient Hospital Stay: Payer: Medicare Other

## 2020-04-26 ENCOUNTER — Inpatient Hospital Stay (HOSPITAL_BASED_OUTPATIENT_CLINIC_OR_DEPARTMENT_OTHER): Payer: Medicare Other | Admitting: Oncology

## 2020-04-26 VITALS — BP 116/80 | HR 64 | Temp 96.7°F | Resp 18 | Wt 217.0 lb

## 2020-04-26 DIAGNOSIS — R7989 Other specified abnormal findings of blood chemistry: Secondary | ICD-10-CM | POA: Diagnosis not present

## 2020-04-26 DIAGNOSIS — Z5112 Encounter for antineoplastic immunotherapy: Secondary | ICD-10-CM | POA: Diagnosis not present

## 2020-04-26 DIAGNOSIS — C3492 Malignant neoplasm of unspecified part of left bronchus or lung: Secondary | ICD-10-CM

## 2020-04-26 DIAGNOSIS — R110 Nausea: Secondary | ICD-10-CM | POA: Diagnosis not present

## 2020-04-26 DIAGNOSIS — Z833 Family history of diabetes mellitus: Secondary | ICD-10-CM | POA: Diagnosis not present

## 2020-04-26 DIAGNOSIS — Z7984 Long term (current) use of oral hypoglycemic drugs: Secondary | ICD-10-CM | POA: Diagnosis not present

## 2020-04-26 DIAGNOSIS — Z8249 Family history of ischemic heart disease and other diseases of the circulatory system: Secondary | ICD-10-CM | POA: Diagnosis not present

## 2020-04-26 DIAGNOSIS — E876 Hypokalemia: Secondary | ICD-10-CM | POA: Diagnosis not present

## 2020-04-26 DIAGNOSIS — E1122 Type 2 diabetes mellitus with diabetic chronic kidney disease: Secondary | ICD-10-CM | POA: Diagnosis not present

## 2020-04-26 DIAGNOSIS — C3432 Malignant neoplasm of lower lobe, left bronchus or lung: Secondary | ICD-10-CM | POA: Diagnosis not present

## 2020-04-26 DIAGNOSIS — N181 Chronic kidney disease, stage 1: Secondary | ICD-10-CM | POA: Diagnosis not present

## 2020-04-26 DIAGNOSIS — I129 Hypertensive chronic kidney disease with stage 1 through stage 4 chronic kidney disease, or unspecified chronic kidney disease: Secondary | ICD-10-CM | POA: Diagnosis not present

## 2020-04-26 DIAGNOSIS — Z79899 Other long term (current) drug therapy: Secondary | ICD-10-CM | POA: Diagnosis not present

## 2020-04-26 DIAGNOSIS — Z886 Allergy status to analgesic agent status: Secondary | ICD-10-CM | POA: Diagnosis not present

## 2020-04-26 DIAGNOSIS — Z809 Family history of malignant neoplasm, unspecified: Secondary | ICD-10-CM | POA: Diagnosis not present

## 2020-04-26 DIAGNOSIS — Z880 Allergy status to penicillin: Secondary | ICD-10-CM | POA: Diagnosis not present

## 2020-04-26 DIAGNOSIS — Z9049 Acquired absence of other specified parts of digestive tract: Secondary | ICD-10-CM | POA: Diagnosis not present

## 2020-04-26 DIAGNOSIS — F17210 Nicotine dependence, cigarettes, uncomplicated: Secondary | ICD-10-CM | POA: Diagnosis not present

## 2020-04-26 DIAGNOSIS — G8929 Other chronic pain: Secondary | ICD-10-CM | POA: Diagnosis not present

## 2020-04-26 LAB — CBC WITH DIFFERENTIAL/PLATELET
Abs Immature Granulocytes: 0.04 10*3/uL (ref 0.00–0.07)
Basophils Absolute: 0 10*3/uL (ref 0.0–0.1)
Basophils Relative: 1 %
Eosinophils Absolute: 0.1 10*3/uL (ref 0.0–0.5)
Eosinophils Relative: 2 %
HCT: 41.7 % (ref 39.0–52.0)
Hemoglobin: 13.7 g/dL (ref 13.0–17.0)
Immature Granulocytes: 1 %
Lymphocytes Relative: 33 %
Lymphs Abs: 1.9 10*3/uL (ref 0.7–4.0)
MCH: 29.7 pg (ref 26.0–34.0)
MCHC: 32.9 g/dL (ref 30.0–36.0)
MCV: 90.3 fL (ref 80.0–100.0)
Monocytes Absolute: 0.5 10*3/uL (ref 0.1–1.0)
Monocytes Relative: 10 %
Neutro Abs: 3 10*3/uL (ref 1.7–7.7)
Neutrophils Relative %: 53 %
Platelets: 160 10*3/uL (ref 150–400)
RBC: 4.62 MIL/uL (ref 4.22–5.81)
RDW: 13.2 % (ref 11.5–15.5)
WBC: 5.7 10*3/uL (ref 4.0–10.5)
nRBC: 0 % (ref 0.0–0.2)

## 2020-04-26 LAB — COMPREHENSIVE METABOLIC PANEL
ALT: 25 U/L (ref 0–44)
AST: 25 U/L (ref 15–41)
Albumin: 4.5 g/dL (ref 3.5–5.0)
Alkaline Phosphatase: 120 U/L (ref 38–126)
Anion gap: 10 (ref 5–15)
BUN: 10 mg/dL (ref 6–20)
CO2: 29 mmol/L (ref 22–32)
Calcium: 9.3 mg/dL (ref 8.9–10.3)
Chloride: 102 mmol/L (ref 98–111)
Creatinine, Ser: 0.85 mg/dL (ref 0.61–1.24)
GFR, Estimated: >60 mL/min (ref >60)
Glucose, Bld: 137 mg/dL — ABNORMAL HIGH (ref 70–99)
Potassium: 3.3 mmol/L — ABNORMAL LOW (ref 3.5–5.1)
Sodium: 141 mmol/L (ref 135–145)
Total Bilirubin: 0.6 mg/dL (ref 0.3–1.2)
Total Protein: 7.9 g/dL (ref 6.5–8.1)

## 2020-04-26 LAB — TSH: TSH: 1.013 u[IU]/mL (ref 0.350–4.500)

## 2020-04-26 MED ORDER — SODIUM CHLORIDE 0.9 % IV SOLN
10.0000 mg/kg | Freq: Once | INTRAVENOUS | Status: AC
  Administered 2020-04-26: 1000 mg via INTRAVENOUS
  Filled 2020-04-26: qty 20

## 2020-04-26 MED ORDER — SODIUM CHLORIDE 0.9 % IV SOLN
Freq: Once | INTRAVENOUS | Status: AC
  Filled 2020-04-26: qty 250

## 2020-04-26 MED ORDER — HEPARIN SOD (PORK) LOCK FLUSH 100 UNIT/ML IV SOLN
INTRAVENOUS | Status: AC
  Filled 2020-04-26: qty 5

## 2020-04-26 MED ORDER — HEPARIN SOD (PORK) LOCK FLUSH 100 UNIT/ML IV SOLN
500.0000 [IU] | Freq: Once | INTRAVENOUS | Status: AC | PRN
  Administered 2020-04-26: 500 [IU]
  Filled 2020-04-26: qty 5

## 2020-04-26 NOTE — Progress Notes (Signed)
Pt in for follow up, reports "normal generalized pain all from metal".

## 2020-04-27 LAB — T4: T4, Total: 6.8 ug/dL (ref 4.5–12.0)

## 2020-05-10 ENCOUNTER — Other Ambulatory Visit: Payer: Self-pay

## 2020-05-10 ENCOUNTER — Inpatient Hospital Stay: Payer: Medicare Other | Attending: Oncology

## 2020-05-10 ENCOUNTER — Encounter: Payer: Self-pay | Admitting: Nurse Practitioner

## 2020-05-10 ENCOUNTER — Telehealth (INDEPENDENT_AMBULATORY_CARE_PROVIDER_SITE_OTHER): Payer: Medicare Other | Admitting: Nurse Practitioner

## 2020-05-10 VITALS — BP 116/74 | HR 76 | Temp 98.6°F | Resp 16 | Wt 213.0 lb

## 2020-05-10 VITALS — BP 127/47 | HR 85 | Wt 213.0 lb

## 2020-05-10 DIAGNOSIS — Z79899 Other long term (current) drug therapy: Secondary | ICD-10-CM | POA: Diagnosis not present

## 2020-05-10 DIAGNOSIS — F17210 Nicotine dependence, cigarettes, uncomplicated: Secondary | ICD-10-CM | POA: Diagnosis not present

## 2020-05-10 DIAGNOSIS — Z5112 Encounter for antineoplastic immunotherapy: Secondary | ICD-10-CM | POA: Insufficient documentation

## 2020-05-10 DIAGNOSIS — I700 Atherosclerosis of aorta: Secondary | ICD-10-CM | POA: Diagnosis not present

## 2020-05-10 DIAGNOSIS — R0602 Shortness of breath: Secondary | ICD-10-CM | POA: Diagnosis not present

## 2020-05-10 DIAGNOSIS — Z9049 Acquired absence of other specified parts of digestive tract: Secondary | ICD-10-CM | POA: Diagnosis not present

## 2020-05-10 DIAGNOSIS — E1122 Type 2 diabetes mellitus with diabetic chronic kidney disease: Secondary | ICD-10-CM | POA: Diagnosis not present

## 2020-05-10 DIAGNOSIS — G8929 Other chronic pain: Secondary | ICD-10-CM | POA: Diagnosis not present

## 2020-05-10 DIAGNOSIS — Z809 Family history of malignant neoplasm, unspecified: Secondary | ICD-10-CM | POA: Insufficient documentation

## 2020-05-10 DIAGNOSIS — Z880 Allergy status to penicillin: Secondary | ICD-10-CM | POA: Insufficient documentation

## 2020-05-10 DIAGNOSIS — J069 Acute upper respiratory infection, unspecified: Secondary | ICD-10-CM | POA: Diagnosis not present

## 2020-05-10 DIAGNOSIS — C3432 Malignant neoplasm of lower lobe, left bronchus or lung: Secondary | ICD-10-CM | POA: Insufficient documentation

## 2020-05-10 DIAGNOSIS — Z833 Family history of diabetes mellitus: Secondary | ICD-10-CM | POA: Insufficient documentation

## 2020-05-10 DIAGNOSIS — R059 Cough, unspecified: Secondary | ICD-10-CM | POA: Insufficient documentation

## 2020-05-10 DIAGNOSIS — R110 Nausea: Secondary | ICD-10-CM | POA: Diagnosis not present

## 2020-05-10 DIAGNOSIS — N181 Chronic kidney disease, stage 1: Secondary | ICD-10-CM | POA: Insufficient documentation

## 2020-05-10 DIAGNOSIS — D649 Anemia, unspecified: Secondary | ICD-10-CM | POA: Insufficient documentation

## 2020-05-10 DIAGNOSIS — Z886 Allergy status to analgesic agent status: Secondary | ICD-10-CM | POA: Diagnosis not present

## 2020-05-10 DIAGNOSIS — C3492 Malignant neoplasm of unspecified part of left bronchus or lung: Secondary | ICD-10-CM

## 2020-05-10 DIAGNOSIS — Z8249 Family history of ischemic heart disease and other diseases of the circulatory system: Secondary | ICD-10-CM | POA: Diagnosis not present

## 2020-05-10 MED ORDER — SODIUM CHLORIDE 0.9 % IV SOLN
Freq: Once | INTRAVENOUS | Status: AC
Start: 2020-05-10 — End: 2020-05-10
  Filled 2020-05-10: qty 250

## 2020-05-10 MED ORDER — SODIUM CHLORIDE 0.9% FLUSH
10.0000 mL | INTRAVENOUS | Status: DC | PRN
Start: 2020-05-10 — End: 2020-05-10
  Filled 2020-05-10: qty 10

## 2020-05-10 MED ORDER — HEPARIN SOD (PORK) LOCK FLUSH 100 UNIT/ML IV SOLN
500.0000 [IU] | Freq: Once | INTRAVENOUS | Status: AC | PRN
  Administered 2020-05-10: 500 [IU]
  Filled 2020-05-10: qty 5

## 2020-05-10 MED ORDER — SODIUM CHLORIDE 0.9 % IV SOLN
10.0000 mg/kg | Freq: Once | INTRAVENOUS | Status: AC
  Administered 2020-05-10: 1000 mg via INTRAVENOUS
  Filled 2020-05-10: qty 20

## 2020-05-10 MED ORDER — AZITHROMYCIN 250 MG PO TABS: ORAL_TABLET | ORAL | 0 refills | Status: DC

## 2020-05-10 NOTE — Progress Notes (Signed)
BP (!) 127/47   Pulse 85   Wt 213 lb (96.6 kg)   BMI 30.56 kg/m    Subjective:    Patient ID: Kristopher [name], male    DOB: [birth year], 60 y.o.   MRN: [id number]  HPI: Kristopher [name] is a 60 y.o. male  Chief Complaint  Patient presents with  . URI    Pt states he started having a cough, sinus congestion, sore throat, and a headache. States these symptoms started yesterday. Took a home test yesterday and was negative per patient.    UPPER RESPIRATORY TRACT INFECTION COVID test was negative yesterday.  Worst symptom: cough and congestion Fever: no Cough: yes Shortness of breath: yes Wheezing: yes Chest pain: no Chest tightness: no Chest congestion: no Nasal congestion: yes Runny nose: yes Post nasal drip: yes Sneezing: yes Sore throat: yes Swollen glands: yes Sinus pressure: yes Headache: yes Face pain: no Toothache: no Ear pain: no  Ear pressure: no  Eyes red/itching:no Eye drainage/crusting: no  Vomiting: no Rash: no Fatigue: yes Sick contacts: yes Strep contacts: no  Context: worse Recurrent sinusitis: no Relief with OTC cold/cough medications: no  Treatments attempted: none    Relevant past medical, surgical, family and social history reviewed and updated as indicated. Interim medical history since our last visit reviewed. Allergies and medications reviewed and updated.  Review of Systems  Constitutional: Positive for fatigue. Negative for fever.  HENT: Positive for congestion. Negative for sore throat.   Respiratory: Positive for cough, shortness of breath and wheezing. Negative for chest tightness.   Cardiovascular: Negative for chest pain.  Gastrointestinal: Negative for nausea.  Skin: Negative for rash.    Per HPI unless specifically indicated above     Objective:    BP (!) 127/47   Pulse 85   Wt 213 lb (96.6 kg)   BMI 30.56 kg/m   Wt Readings from Last 3 Encounters:  05/10/20 213 lb (96.6 kg)  05/10/20 213 lb (96.6 kg)   04/26/20 217 lb (98.4 kg)    Physical Exam Vitals and nursing note reviewed.  Constitutional:      General: He is not in acute distress.    Appearance: He is not ill-appearing.  HENT:     Head: Normocephalic.     Right Ear: Hearing normal.     Left Ear: Hearing normal.     Nose: Nose normal.  Pulmonary:     Effort: Pulmonary effort is normal. No respiratory distress.  Neurological:     Mental Status: He is alert.  Psychiatric:        Mood and Affect: Mood normal.        Behavior: Behavior normal.        Thought Content: Thought content normal.        Judgment: Judgment normal.     Results for orders placed or performed in visit on 04/26/20  T4  Result Value Ref Range   T4, Total 6.8 4.5 - 12.0 ug/dL  TSH  Result Value Ref Range   TSH 1.013 0.350 - 4.500 uIU/mL  Comprehensive metabolic panel  Result Value Ref Range   Sodium 141 135 - 145 mmol/L   Potassium 3.3 (L) 3.5 - 5.1 mmol/L   Chloride 102 98 - 111 mmol/L   CO2 29 22 - 32 mmol/L   Glucose, Bld 137 (H) 70 - 99 mg/dL   BUN 10 6 - 20 mg/dL   Creatinine, Ser 0.85 0.61 - 1.24 mg/dL   Calcium  9.3 8.9 - 10.3 mg/dL   Total Protein 7.9 6.5 - 8.1 g/dL   Albumin 4.5 3.5 - 5.0 g/dL   AST 25 15 - 41 U/L   ALT 25 0 - 44 U/L   Alkaline Phosphatase 120 38 - 126 U/L   Total Bilirubin 0.6 0.3 - 1.2 mg/dL   GFR, Estimated >60 >60 mL/min   Anion gap 10 5 - 15  CBC with Differential/Platelet  Result Value Ref Range   WBC 5.7 4.0 - 10.5 K/uL   RBC 4.62 4.22 - 5.81 MIL/uL   Hemoglobin 13.7 13.0 - 17.0 g/dL   HCT 41.7 39.0 - 52.0 %   MCV 90.3 80.0 - 100.0 fL   MCH 29.7 26.0 - 34.0 pg   MCHC 32.9 30.0 - 36.0 g/dL   RDW 13.2 11.5 - 15.5 %   Platelets 160 150 - 400 K/uL   nRBC 0.0 0.0 - 0.2 %   Neutrophils Relative % 53 %   Neutro Abs 3.0 1.7 - 7.7 K/uL   Lymphocytes Relative 33 %   Lymphs Abs 1.9 0.7 - 4.0 K/uL   Monocytes Relative 10 %   Monocytes Absolute 0.5 0.1 - 1.0 K/uL   Eosinophils Relative 2 %   Eosinophils  Absolute 0.1 0.0 - 0.5 K/uL   Basophils Relative 1 %   Basophils Absolute 0.0 0.0 - 0.1 K/uL   Immature Granulocytes 1 %   Abs Immature Granulocytes 0.04 0.00 - 0.07 K/uL      Assessment & Plan:   Problem List Items Addressed This Visit      Other   Shortness of breath   Relevant Orders   DG Chest 2 View    Other Visit Diagnoses    Viral upper respiratory tract infection    -  Primary   Complete course of antibiotics. Obtain chest xray. RTC if symptoms worsen or seek attention in the ER or UC. Patient currently being treated for cancer.   Relevant Medications   azithromycin (ZITHROMAX Z-PAK) 250 MG tablet       Follow up plan: Return if symptoms worsen or fail to improve.     This visit was completed via MyChart due to the restrictions of the COVID-19 pandemic. All issues as above were discussed and addressed. Physical exam was done as above through visual confirmation on MyChart. If it was felt that the patient should be evaluated in the office, they were directed there. The patient verbally consented to this visit. 1. Location of the patient: Home 2. Location of the provider: Office 3. Those involved with this call:  ? Provider: Jon Billings, NP ? CMA: Yvonna Alanis, CMA ? Front Desk/Registration: [place] 4. Time spent on call: 15 minutes with patient face to face via video conference. More than 50% of this time was spent in counseling and coordination of care. 20 minutes total spent in review of patient's record and preparation of their chart.

## 2020-05-11 ENCOUNTER — Other Ambulatory Visit: Payer: Self-pay

## 2020-05-11 ENCOUNTER — Ambulatory Visit
Admission: RE | Admit: 2020-05-11 | Discharge: 2020-05-11 | Disposition: A | Discharge status: Home / Self Care | Payer: Medicare Other | Source: Ambulatory Visit | Attending: Nurse Practitioner | Admitting: Nurse Practitioner

## 2020-05-11 ENCOUNTER — Ambulatory Visit
Admission: RE | Admit: 2020-05-11 | Discharge: 2020-05-11 | Disposition: A | Discharge status: Home / Self Care | Payer: Medicare Other | Attending: Nurse Practitioner | Admitting: Nurse Practitioner

## 2020-05-11 DIAGNOSIS — R0602 Shortness of breath: Secondary | ICD-10-CM | POA: Insufficient documentation

## 2020-05-11 DIAGNOSIS — R059 Cough, unspecified: Secondary | ICD-10-CM | POA: Diagnosis not present

## 2020-05-12 NOTE — Progress Notes (Signed)
Check Xray is normal.  Continue with plan as discussed during visit.

## 2020-05-19 NOTE — Progress Notes (Signed)
[place]  [phone] Fax:(336) [phone]  ID: Kristopher [name] OB: 1961/01/01  MR#: [id number]  HUT#:[id number]  Patient Care Team: [name], DO as PCP - General (Family Medicine) Gavin Pound, [place] (Inactive) as Certified Medical Assistant Telford Nab, RN as Registered Nurse Vanita Ingles, RN as Case Manager (General Practice) [name], MD as Referring Physician (Pain Medicine) [name], MD as Consulting Physician (Oncology)  CHIEF COMPLAINT: Clinical stage IIIa squamous cell 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  INTERVAL HISTORY: Patient returns to clinic today for further evaluation and consideration of cycle 10 of maintenance durvalumab.  He currently feels well and is asymptomatic.  He continues to tolerate his treatments well without significant side effects. He has a good appetite and denies weight loss.  He has no neurologic complaints.  He denies any recent fevers or illnesses.  He has no chest pain, shortness of breath, cough, or hemoptysis.  He denies any nausea, vomiting, constipation, or diarrhea.  He has no urinary complaints.  Patient offers no specific complaints today.  REVIEW OF SYSTEMS:   Review of Systems  Constitutional: Negative.  Negative for fever, malaise/fatigue and weight loss.  Respiratory: Negative.  Negative for cough and shortness of breath.   Cardiovascular: Negative.  Negative for chest pain and leg swelling.  Gastrointestinal: Negative.  Negative for abdominal pain and heartburn.  Genitourinary: Negative.  Negative for dysuria and flank pain.  Musculoskeletal: Negative.  Negative for back pain.  Skin: Negative.  Negative for rash.  Neurological: Negative.  Negative for dizziness, focal weakness, weakness and headaches.  Psychiatric/Behavioral: Negative.  The patient is not nervous/anxious.     As per HPI. Otherwise, a complete review of systems is negative.  PAST MEDICAL  HISTORY: Past Medical History:  Diagnosis Date  . Allergy   . Arthritis    left foot  . Benign hypertensive kidney disease   . Chronic back pain    Four rods in back  . Diabetes mellitus, type 2 ([place])   . Dyspnea   . GERD (gastroesophageal reflux disease)   . Hypertension   . Malignant neoplasm of lung ([place])   . Migraines    daily    PAST SURGICAL HISTORY: Past Surgical History:  Procedure Laterality Date  . APPENDECTOMY    . BACK SURGERY    . COLONOSCOPY WITH PROPOFOL N/A 02/19/2016   Procedure: COLONOSCOPY WITH PROPOFOL;  Surgeon: Lucilla Lame, MD;  Location: [facility];  Service: Endoscopy;  Laterality: N/A;  . COLONOSCOPY WITH PROPOFOL N/A 01/19/2018   Procedure: COLONOSCOPY WITH PROPOFOL;  Surgeon: Lucilla Lame, MD;  Location: [facility];  Service: Endoscopy;  Laterality: N/A;  Diabetic - oral meds  . DG OPERATIVE LEFT HIP ([place] HX)     10/19  . ELECTROMAGNETIC NAVIGATION BROCHOSCOPY Left 11/18/2018   Procedure: ELECTROMAGNETIC NAVIGATION BRONCHOSCOPY;  Surgeon: Tyler Pita, MD;  Location: [facility];  Service: Cardiopulmonary;  Laterality: Left;  . FLEXIBLE BRONCHOSCOPY Bilateral 01/20/2019   Procedure: FLEXIBLE BRONCHOSCOPY;  Surgeon: Ottie Glazier, MD;  Location: [facility];  Service: Thoracic;  Laterality: Bilateral;  . FLEXIBLE BRONCHOSCOPY Bilateral 01/22/2019   Procedure: FLEXIBLE BRONCHOSCOPY;  Surgeon: Ottie Glazier, MD;  Location: [facility];  Service: Thoracic;  Laterality: Bilateral;  . FOOT SURGERY Left    Screws and plates  . JOINT REPLACEMENT Left 12/2017   DR Rudene Christians Hip  . KNEE SURGERY Left    X 2  . LEG SURGERY    .  LUNG CANCER SURGERY    . POLYPECTOMY N/A 02/19/2016   Procedure: POLYPECTOMY;  Surgeon: Lucilla Lame, MD;  Location: [place];  Service: Endoscopy;  Laterality: N/A;  . POLYPECTOMY  01/19/2018   Procedure: POLYPECTOMY;  Surgeon: Lucilla Lame, MD;  Location: [facility];  Service: Endoscopy;;  .  PORTACATH PLACEMENT N/A 11/11/2019   Procedure: INSERTION PORT-A-CATH;  Surgeon: Nestor Lewandowsky, MD;  Location: [facility];  Service: General;  Laterality: N/A;  . THORACOTOMY Left 01/14/2019   Procedure: THORACOTOMY MAJOR, LEFT;  Surgeon: Nestor Lewandowsky, MD;  Location: [facility];  Service: General;  Laterality: Left;  . TOTAL HIP ARTHROPLASTY Left 12/02/2017   Procedure: TOTAL HIP ARTHROPLASTY ANTERIOR APPROACH;  Surgeon: Hessie Knows, MD;  Location: [facility];  Service: Orthopedics;  Laterality: Left;  [name] VIDEO BRONCHOSCOPY Left 01/14/2019   Procedure: VIDEO BRONCHOSCOPY WITH FLUORO, LEFT;  Surgeon: Nestor Lewandowsky, MD;  Location: [facility];  Service: General;  Laterality: Left;  [name] VIDEO BRONCHOSCOPY WITH ENDOBRONCHIAL NAVIGATION N/A 10/15/2019   Procedure: VIDEO BRONCHOSCOPY WITH ENDOBRONCHIAL NAVIGATION;  Surgeon: Ottie Glazier, MD;  Location: [facility];  Service: Thoracic;  Laterality: N/A;  . VIDEO BRONCHOSCOPY WITH ENDOBRONCHIAL ULTRASOUND N/A 10/15/2019   Procedure: VIDEO BRONCHOSCOPY WITH ENDOBRONCHIAL ULTRASOUND;  Surgeon: Ottie Glazier, MD;  Location: [facility];  Service: Thoracic;  Laterality: N/A;    FAMILY HISTORY: Family History  Problem Relation Age of Onset  . Cancer Father   . Diabetes Sister   . Thrombosis Sister     ADVANCED DIRECTIVES (Y/N):  N  HEALTH MAINTENANCE: Social History   Tobacco Use  . Smoking status: Current Every Day Smoker    Packs/day: 0.25    Years: 35.00    Pack years: 8.75    Types: Cigarettes    Last attempt to quit: 01/14/2019    Years since quitting: 1.3  . Smokeless tobacco: Never Used  Vaping Use  . Vaping Use: Never used  Substance Use Topics  . Alcohol use: No    Alcohol/week: 0.0 standard drinks  . Drug use: Yes    Types: Oxycodone    Comment: prescribed     Colonoscopy:  PAP:  Bone density:  Lipid panel:  Allergies  Allergen Reactions  . Acetaminophen Swelling  . Aspirin Anaphylaxis  . Epinephrine Anaphylaxis    Does not  include albuterol  . Novocain [Procaine] Anaphylaxis  . Penicillins Anaphylaxis    Has patient had a PCN reaction causing immediate rash, facial/tongue/throat swelling, SOB or lightheadedness with hypotension: Yes Has patient had a PCN reaction causing severe rash involving mucus membranes or skin necrosis: No Has patient had a PCN reaction that required hospitalization: Yes Has patient had a PCN reaction occurring within the last 10 years: No If all of the above answers are "NO", then may proceed with Cephalosporin use.   . Strawberry Extract Anaphylaxis  . Shellfish Allergy Hives and Nausea And Vomiting    Current Outpatient Medications  Medication Sig Dispense Refill  . Accu-Chek FastClix Lancets MISC USE TO TEST BLOOD SUGAR 2X A DAY 100 each 12  . albuterol (VENTOLIN HFA) 108 (90 Base) MCG/ACT inhaler Inhale into the lungs.    [name] amLODipine (NORVASC) 5 MG tablet Take 1 tablet (5 mg total) by mouth 2 (two) times daily. 180 tablet 1  . ARNUITY ELLIPTA 100 MCG/ACT AEPB Inhale 1 puff into the lungs daily.     [name] atorvastatin (LIPITOR) 10 MG tablet TAKE 1 TABLET (10 MG TOTAL) BY MOUTH DAILY AT 6 PM.  90 tablet 1  . BREZTRI AEROSPHERE 160-9-4.8 MCG/ACT AERO Inhale 2 puffs into the lungs 2 (two) times daily. Maintenance per pulmonology    . diphenhydrAMINE (BENADRYL) 25 MG tablet Take 50 mg by mouth daily.    . empagliflozin (JARDIANCE) 25 MG TABS tablet Take 1 tablet (25 mg total) by mouth daily before breakfast. 30 tablet 3  . esomeprazole (NEXIUM) 40 MG capsule Take 1 capsule (40 mg total) by mouth daily. 90 capsule 3  . glucose blood (ACCU-CHEK GUIDE) test strip USE TO TEST BLOOD SUGAR 2X A DAY 100 strip 3  . hydrALAZINE (APRESOLINE) 100 MG tablet TAKE 1 TABLET BY MOUTH TWICE A DAY 180 tablet 0  . lidocaine (LIDODERM) 5 % Place 1 patch onto the skin daily. Remove & Discard patch within 12 hours or as directed by MD (Patient taking differently: Place 1 patch onto the skin daily as needed  (pain). Remove & Discard patch within 12 hours or as directed by MD) 30 patch 12  . lidocaine-prilocaine (EMLA) cream Apply 1 application topically as needed. Apply small amount to port site at least 1 hour prior to it being accessed, cover with plastic wrap 30 g 1  . metFORMIN (GLUCOPHAGE) 500 MG tablet TAKE 2 TABLETS (1,000 MG TOTAL) BY MOUTH 2 (TWO) TIMES DAILY WITH A MEAL. 360 tablet 0  . metoprolol tartrate (LOPRESSOR) 50 MG tablet TAKE 1 TABLET BY MOUTH TWICE A DAY 180 tablet 0  . montelukast (SINGULAIR) 10 MG tablet Take 10 mg by mouth daily.    [name] morphine (MSIR) 15 MG tablet Take 1 tablet (15 mg total) by mouth every 6 (six) hours as needed for moderate pain or severe pain. Must last 30 days. 120 tablet 0  . [START ON 06/23/2020] morphine (MSIR) 15 MG tablet Take 1 tablet (15 mg total) by mouth every 6 (six) hours as needed for moderate pain or severe pain. Must last 30 days. 120 tablet 0  . [START ON 07/23/2020] morphine (MSIR) 15 MG tablet Take 1 tablet (15 mg total) by mouth every 6 (six) hours as needed for moderate pain or severe pain. Must last 30 days. 120 tablet 0  . Multiple Vitamin (MULTIVITAMIN WITH MINERALS) TABS tablet Take 1 tablet by mouth daily.    Derrill Memo ON 07/09/2020] NONFORMULARY OR COMPOUNDED ITEM Apply 1-2 mLs topically 4 (four) times daily as needed. 10% Ketamine/2% Cyclobenzaprine/6% Gabapentin Cream 240 each PRN  . nortriptyline (PAMELOR) 25 MG capsule TAKE 2 CAPSULES (50 MG TOTAL) BY MOUTH AT BEDTIME. 180 capsule 1  . pantoprazole (PROTONIX) 40 MG tablet Take 1 tablet (40 mg total) by mouth daily. 15 tablet 2  . Potassium Chloride ER 20 MEQ TBCR TAKE 1 TABLET BY MOUTH EVERY DAY 90 tablet 1  . STIOLTO RESPIMAT 2.5-2.5 MCG/ACT AERS Inhale 2 puffs into the lungs daily. Prn per pulmonology notes    . SUMAtriptan (IMITREX) 50 MG tablet Take 1 tablet (50 mg total) by mouth every 2 (two) hours as needed for migraine. Take 1 tab at onset of migraine. May repeat in 2 hours if  headache persists or recurs. 10 tablet 12   No current facility-administered medications for this visit.   Facility-Administered Medications Ordered in Other Visits  Medication Dose Route Frequency Provider Last Rate Last Admin  . 0.9 %  sodium chloride infusion    Continuous PRN Dionne Bucy, CRNA   New Bag at 01/22/19 [phone]  . heparin lock flush 100 unit/mL  500 Units Intracatheter Once PRN  [name], MD      . sodium chloride flush (NS) 0.9 % injection 10 mL  10 mL Intracatheter PRN [name], MD   10 mL at 03/01/20 0953  . sodium chloride flush (NS) 0.9 % injection 10 mL  10 mL Intracatheter PRN [name], MD        OBJECTIVE: Vitals:   05/24/20 1008  BP: 127/85  Pulse: 65  Resp: 20  SpO2: 97%     Body mass index is 30.81 kg/m.    ECOG FS:0 - Asymptomatic  General: Well-developed, well-nourished, no acute distress. Eyes: Pink conjunctiva, anicteric sclera. HEENT: Normocephalic, moist mucous membranes. Lungs: No audible wheezing or coughing. Heart: Regular rate and rhythm. Abdomen: Soft, nontender, no obvious distention. Musculoskeletal: No edema, cyanosis, or clubbing. Neuro: Alert, answering all questions appropriately. Cranial nerves grossly intact. Skin: No rashes or petechiae noted. Psych: Normal affect.  LAB RESULTS:  Lab Results  Component Value Date   NA 137 05/24/2020   K 3.5 05/24/2020   CL 100 05/24/2020   CO2 28 05/24/2020   GLUCOSE 133 (H) 05/24/2020   BUN 11 05/24/2020   CREATININE 0.64 05/24/2020   CALCIUM 8.8 (L) 05/24/2020   PROT 7.5 05/24/2020   ALBUMIN 4.1 05/24/2020   AST 23 05/24/2020   ALT 21 05/24/2020   ALKPHOS 127 (H) 05/24/2020   BILITOT 0.6 05/24/2020   GFRNONAA >60 05/24/2020   GFRAA >60 12/01/2019    Lab Results  Component Value Date   WBC 5.2 05/24/2020   NEUTROABS 3.1 05/24/2020   HGB 12.2 (L) 05/24/2020   HCT 36.8 (L) 05/24/2020   MCV 87.8 05/24/2020   PLT 208 05/24/2020      STUDIES: DG Chest 2 View  Result Date: 05/12/2020 CLINICAL DATA:  Shortness of breath on exertion with cough x3 days. EXAM: CHEST - 2 VIEW COMPARISON:  Chest radiograph November 11, 2019. FINDINGS: Stable position of the left chest Port-A-Cath with tip in the SVC. The heart size and mediastinal contours are within normal limits. Aortic atherosclerosis. No focal consolidation. Similar pleural thickening at the left costophrenic angle. No pneumothorax. The visualized skeletal structures are unremarkable. IMPRESSION: No acute cardiopulmonary disease. Electronically Signed   By: Dahlia Bailiff MD   On: 05/12/2020 09:20    ASSESSMENT: Clinical stage IIIa squamous cell 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  PLAN:    1.  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  He had a complicated postoperative course. CT scan results from August 14, 2019 reviewed independently with a suspicious subcarinal lymph node measuring 1.4 cm.  Follow-up PET scan on August 23, 2019 revealed hypermetabolism in lymph node highly suspicious for recurrence.  Biopsy confirmed recurrence increasing patient's stage from IA up to IIIA. Patient completed concurrent XRT and weekly carboplatinum and Taxol on January 05, 2020.  Patient initiated 1 year of maintenance immunotherapy using durvalumab on January 19, 2020.  Proceed with cycle 10 of treatment today.  Return to clinic in 2 weeks for treatment only and then in 4 weeks for further evaluation and consideration of cycle 12.  Will reimage with CT scan 1 to 2 days prior to cycle 12.   2.  Pain: Chronic and unrelated to his malignancy.  Continue monitoring and treatment per pain clinic. 3.  Nausea: Patient does not complain of this today.  Patient asked if it were okay to take CBD Gummies  for nausea.  From an oncology standpoint this would be okay, but patient did state concern that it would show a  positive urine test.  He was instructed to let pain clinic know that he was taking this for oncologic purposes. 4.  Hypokalemia: Resolved.  Continue oral potassium supplementation. 5.  Anemia: Mild, monitor.   Patient expressed understanding and was in agreement with this plan. He also understands that He can call clinic at any time with any questions, concerns, or complaints.   Cancer Staging Squamous cell carcinoma of left lung [facility]) Staging form: Lung, AJCC 8th Edition - Clinical stage from 02/12/2019: Stage IIIA (cT1b, cN2, cM0) - Signed by [name], MD on 11/01/2019   [name], MD   05/24/2020 10:29 AM

## 2020-05-20 ENCOUNTER — Other Ambulatory Visit: Payer: Self-pay | Admitting: Family Medicine

## 2020-05-21 NOTE — Progress Notes (Signed)
PROVIDER NOTE: Information contained herein reflects review and annotations entered in association with encounter. Interpretation of such information and data should be left to medically-trained personnel. Information provided to patient can be located elsewhere in the medical record under "Patient Instructions". Document created using STT-dictation technology, any transcriptional errors that may result from process are unintentional.    Patient: Kristopher [name]  Service Category: E/M  Provider: Gaspar Cola, MD  DOB: 01/13/1961  DOS: 05/22/2020  Specialty: Interventional Pain Management  MRN: [id number]  Setting: Ambulatory outpatient  PCP: [name], DO  Type: Established Patient    Referring Provider: [name], DO  Location: Office  Delivery: Face-to-face     HPI  [name], a 60 y.o. year old male, is here today because of his Chronic pain syndrome [G89.4]. Mr. Jurgens primary complain today is Back Pain (low) Last encounter: My last encounter with him was on 02/23/2020. Pertinent problems: Mr. Beckford has Chronic low back pain (Primary Area of Pain) (Bilateral) (L>R); Lumbar spondylosis; Chronic lumbar radicular pain (Secondary area of Pain) (S1 dermatomal) (Left); 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); Chronic neck pain (posterior midline) (Bilateral) (L>R); Cervical spondylosis; Chronic cervical radicular pain (Third area of Pain) (Bilateral) (C5/C6 dermatome) (L>R); Cervical spinal stenosis (C4-5); Cervical foraminal stenosis (Bilateral C5-6); Retrolisthesis of L5-S1; Cervical disc herniation (C4-5 and C5-6); Lumbar disc herniation (L5-S1); Chronic sacroiliac joint pain (Bilateral) (L>R); Chronic hip pain (Left); Lumbar facet syndrome (Location of Primary Source of Pain) (Bilateral) (L>R); Chronic lower extremity pain (Secondary area of Pain) (Left); Greater occipital neuralgia (Right); Chronic pain syndrome; Migraine; Musculoskeletal  pain, chronic; Left-sided weakness; Hip arthritis; S/P hip replacement; Squamous cell carcinoma of left lung (HCC); and Chronic upper extremity pain (Third area of Pain) (Bilateral) (L>R) on their pertinent problem list. Pain Assessment: Severity of   is reported as a 4 /10. Location: Back Lower/legs, left is worse, left foot goes numb at times. Onset: More than a month ago. Quality: Stabbing,Constant,Aching. Timing: Constant. Modifying factor(s): medications, hot showers, resting. Vitals:  height is _0  (1.778 m) and weight is 209 lb (94.8 kg). His temporal temperature is 97.2 F (36.2 C) (abnormal). His blood pressure is 126/93 (abnormal) and his pulse is 69. His respiration is 18 and oxygen saturation is 98%.   Reason for encounter: medication management.   The patient indicates doing well with the current medication regimen. No adverse reactions or side effects reported to the medications.   RTCB: 08/22/2020  Pharmacotherapy Assessment   Analgesic: Morphine IR 15 mg, 1 tablet p.o. every 6 hours (60 mg/day of morphine) (60 MME) + Compounded Specialty Analgesic Cream (w/ Ketamine) MME/day:60 mg/day.   Monitoring: [place] PMP: PDMP reviewed during this encounter.       Pharmacotherapy: No side-effects or adverse reactions reported. Compliance: No problems identified. Effectiveness: Clinically acceptable.  Hart Rochester, RN  05/22/2020  8:16 AM  Sign when Signing Visit Nursing Pain Medication Assessment:  Safety precautions to be maintained throughout the outpatient stay will include: orient to surroundings, keep bed in low position, maintain call bell within reach at all times, provide assistance with transfer out of bed and ambulation.  Medication Inspection Compliance: Pill count conducted under aseptic conditions, in front of the patient. Neither the pills nor the bottle was removed from the patient's sight at any time. Once count was completed pills were immediately returned to the  patient in their original bottle.  Medication: Morphine IR Pill/Patch Count: 11 of  120 pills remain Pill/Patch Appearance: Markings consistent with prescribed medication Bottle Appearance: Standard pharmacy container. Clearly labeled. Filled Date: 02 / 21 / 2022 Last Medication intake:  Today    UDS:  Summary  Date Value Ref Range Status  10/13/2019 Note  Final    Comment:    ==================================================================== [employer] Select 13 (MW) ==================================================================== Test                             Result       Flag       Units  Drug Present and Declared for Prescription Verification   Oxycodone                      1276         EXPECTED   ng/mg creat   Oxymorphone                    1252         EXPECTED   ng/mg creat   Noroxycodone                   1238         EXPECTED   ng/mg creat   Noroxymorphone                 131          EXPECTED   ng/mg creat    Sources of oxycodone are scheduled prescription medications.    Oxymorphone, noroxycodone, and noroxymorphone are expected    metabolites of oxycodone. Oxymorphone is also available as a    scheduled prescription medication.  ==================================================================== Test                      Result    Flag   Units      Ref Range   Creatinine              121              mg/dL      >=20 ==================================================================== Declared Medications:  The flagging and interpretation on this report are based on the  following declared medications.  Unexpected results may arise from  inaccuracies in the declared medications.   **Note: The testing scope of this panel includes these medications:   Oxycodone   **Note: The testing scope of this panel does not include the  following reported medications:   Amlodipine (Norvasc)  Atorvastatin (Lipitor)  Dulaglutide (Trulicity)  Fluticasone   Hydralazine (Apresoline)  Metformin (Glucophage)  Metoprolol (Toprol)  Montelukast (Singulair)  Nortriptyline (Pamelor)  Pantoprazole (Protonix)  Potassium (Klor-Con)  Sumatriptan (Imitrex)  Topical Lidocaine ==================================================================== For clinical consultation, please call [phone]. ====================================================================      ROS  Constitutional: Denies any fever or chills Gastrointestinal: No reported hemesis, hematochezia, vomiting, or acute GI distress Musculoskeletal: Denies any acute onset joint swelling, redness, loss of ROM, or weakness Neurological: No reported episodes of acute onset apraxia, aphasia, dysarthria, agnosia, amnesia, paralysis, loss of coordination, or loss of consciousness  Medication Review  Accu-Chek FastClix Lancets, Budeson-Glycopyrrol-Formoterol, Fluticasone Furoate, NONFORMULARY OR COMPOUNDED ITEM, Potassium Chloride ER, SUMAtriptan, Tiotropium Bromide-Olodaterol, albuterol, amLODipine, atorvastatin, diphenhydrAMINE, empagliflozin, esomeprazole, glucose blood, hydrALAZINE, lidocaine, lidocaine-prilocaine, metFORMIN, metoprolol tartrate, montelukast, morphine, multivitamin with minerals, nortriptyline, and pantoprazole  History Review  Allergy: Mr. Borum is allergic to acetaminophen, aspirin, epinephrine, novocain [procaine], penicillins, strawberry extract, and shellfish allergy. Drug: Mr.  Kuenzel  reports current drug use. Drug: Oxycodone. Alcohol:  reports no history of alcohol use. Tobacco:  reports that he has been smoking cigarettes. He has a 8.75 pack-year smoking history. He has never used smokeless tobacco. Social: Mr. Hartlage  reports that he has been smoking cigarettes. He has a 8.75 pack-year smoking history. He has never used smokeless tobacco. He reports current drug use. Drug: Oxycodone. He reports that he does not drink alcohol. Medical:  has a past medical history  of Allergy, Arthritis, Benign hypertensive kidney disease, Chronic back pain, Diabetes mellitus, type 2 ([place]), Dyspnea, GERD (gastroesophageal reflux disease), Hypertension, Malignant neoplasm of lung ([place]), and Migraines. Surgical: Mr. Wolman  has a past surgical history that includes Back surgery; Appendectomy; Leg Surgery; Knee surgery (Left); Foot surgery (Left); Colonoscopy with propofol (N/A, 02/19/2016); polypectomy (N/A, 02/19/2016); Total hip arthroplasty (Left, 12/02/2017); Colonoscopy with propofol (N/A, 01/19/2018); polypectomy (01/19/2018); DG OPERATIVE LEFT HIP ([place] HX); Joint replacement (Left, 12/2017); Video bronchoscopy (Left, 01/14/2019); Thoracotomy (Left, 01/14/2019); Flexible bronchoscopy (Bilateral, 01/20/2019); Flexible bronchoscopy (Bilateral, 01/22/2019); Electormagnetic navigation bronchoscopy (Left, 11/18/2018); Lung cancer surgery; Video bronchoscopy with endobronchial ultrasound (N/A, 10/15/2019); Video bronchoscopy with endobronchial navigation (N/A, 10/15/2019); and Portacath placement (N/A, 11/11/2019). Family: family history includes Cancer in his father; Diabetes in his sister; Thrombosis in his sister.  Laboratory Chemistry Profile   Renal Lab Results  Component Value Date   BUN 10 04/26/2020   CREATININE 0.85 04/26/2020   BCR 15 11/26/2019   GFRAA >60 12/01/2019   GFRNONAA >60 04/26/2020     Hepatic Lab Results  Component Value Date   AST 25 04/26/2020   ALT 25 04/26/2020   ALBUMIN 4.5 04/26/2020   ALKPHOS 120 04/26/2020   LIPASE 50 02/17/2019     Electrolytes Lab Results  Component Value Date   NA 141 04/26/2020   K 3.3 (L) 04/26/2020   CL 102 04/26/2020   CALCIUM 9.3 04/26/2020   MG 2.4 01/25/2019   PHOS 3.5 01/23/2019     Bone No results found for: VD25OH, VD125OH2TOT, BZ1696VE9, FY1017PZ0, 25OHVITD1, 25OHVITD2, 25OHVITD3, TESTOFREE, TESTOSTERONE   Inflammation (CRP: Acute Phase) (ESR: Chronic Phase) Lab Results  Component Value Date   CRP  0.7 04/03/2015   ESRSEDRATE 11 10/21/2017   LATICACIDVEN 1.7 01/16/2019       Note: Above Lab results reviewed.  Recent Imaging Review  DG Chest 2 View CLINICAL DATA:  Shortness of breath on exertion with cough x3 days.  EXAM: CHEST - 2 VIEW  COMPARISON:  Chest radiograph November 11, 2019.  FINDINGS: Stable position of the left chest Port-A-Cath with tip in the SVC. The heart size and mediastinal contours are within normal limits. Aortic atherosclerosis. No focal consolidation. Similar pleural thickening at the left costophrenic angle. No pneumothorax. The visualized skeletal structures are unremarkable.  IMPRESSION: No acute cardiopulmonary disease.  Electronically Signed   By: Dahlia Bailiff MD   On: 05/12/2020 09:20 Note: Reviewed        Physical Exam  General appearance: Well nourished, well developed, and well hydrated. In no apparent acute distress Mental status: Alert, oriented x 3 (person, place, & time)       Respiratory: No evidence of acute respiratory distress Eyes: PERLA Vitals: BP (!) 126/93   Pulse 69   Temp (!) 97.2 F (36.2 C) (Temporal)   Resp 18   Ht _0  (1.778 m)   Wt 209 lb (94.8 kg)   SpO2 98%   BMI 29.99 kg/m  BMI: Estimated body mass  index is 29.99 kg/m as calculated from the following:   Height as of this encounter: _0  (1.778 m).   Weight as of this encounter: 209 lb (94.8 kg). Ideal: Ideal body weight: 73 kg (160 lb 15 oz) Adjusted ideal body weight: 81.7 kg (180 lb 2.6 oz)  Assessment   Status Diagnosis  Controlled Controlled Controlled 1. Chronic pain syndrome   2. Chronic low back pain (Primary Area of Pain) (Bilateral) (L>R)   3. Chronic lower extremity pain (Secondary area of Pain) (Left)   4. Chronic lumbar radicular pain (Secondary area of Pain) (S1 dermatomal) (Left)   5. Chronic hip pain (Left)   6. Chronic cervical radicular pain (Third area of Pain) (Bilateral) (C5/C6 dermatome) (L>R)   7. Chronic pain of  both upper extremities   8. Pharmacologic therapy   9. Uncomplicated opioid dependence ([place])      Updated Problems: No problems updated.  Plan of Care  Problem-specific:  No problem-specific Assessment & Plan notes found for this encounter.  Mr. VOLNEY REIERSON has a current medication list which includes the following long-term medication(s): amlodipine, atorvastatin, esomeprazole, hydralazine, metformin, metoprolol tartrate, [START ON 05/24/2020] morphine, [START ON 06/23/2020] morphine, [START ON 07/23/2020] morphine, nortriptyline, pantoprazole, potassium chloride er, and sumatriptan.  Pharmacotherapy (Medications Ordered): Meds ordered this encounter  Medications  . morphine (MSIR) 15 MG tablet    Sig: Take 1 tablet (15 mg total) by mouth every 6 (six) hours as needed for moderate pain or severe pain. Must last 30 days.    Dispense:  120 tablet    Refill:  0    Not a duplicate. Do NOT delete! Chronic Pain: STOP Act NOT applicable. Fill 1 day early if closed on refill date. Avoid benzodiazepines within 8 hours of opioids. Do not send refill requests.  [name] morphine (MSIR) 15 MG tablet    Sig: Take 1 tablet (15 mg total) by mouth every 6 (six) hours as needed for moderate pain or severe pain. Must last 30 days.    Dispense:  120 tablet    Refill:  0    Not a duplicate. Do NOT delete! Chronic Pain: STOP Act NOT applicable. Fill 1 day early if closed on refill date. Avoid benzodiazepines within 8 hours of opioids. Do not send refill requests.  [name] morphine (MSIR) 15 MG tablet    Sig: Take 1 tablet (15 mg total) by mouth every 6 (six) hours as needed for moderate pain or severe pain. Must last 30 days.    Dispense:  120 tablet    Refill:  0    Not a duplicate. Do NOT delete! Chronic Pain: STOP Act NOT applicable. Fill 1 day early if closed on refill date. Avoid benzodiazepines within 8 hours of opioids. Do not send refill requests.  . NONFORMULARY OR COMPOUNDED ITEM    Sig: Apply 1-2 mLs  topically 4 (four) times daily as needed. 10% Ketamine/2% Cyclobenzaprine/6% Gabapentin Cream    Dispense:  240 each    Refill:  PRN    Please compound: Ketamine (10%) + Cyclobenzaprine (2%) + Gabapentin Cream (6%) into cream   Orders:  Orders Placed This Encounter  Procedures  . [employer]ox[employer] Select 13 (MW), Urine    Volume: 30 ml(s). Minimum 3 ml of urine is needed. Document temperature of fresh sample. Indications: Long term (current) use of opiate analgesic (Y65.993)    Order Specific Question:   Release to patient    Answer:   Immediate   Follow-up plan:  Return in about 3 months (around 08/22/2020) for (F2F), (MM).      Interventional therapies: Planned, scheduled, and/or pending:   None at this time. Claims to have anaphylactic allergy to Local Anesthetics. He was sent for testing and shown/proven not to be allergic to Lidocaine.   Considering:   None at this time. Claims to have anaphylactic allergy to Local Anesthetics. He was sent for testing and shown/proven not to be allergic to Lidocaine.   Palliative PRN treatment(s):   None at this time. Claims to have anaphylactic allergy to Local Anesthetics. He was sent for testing and shown/proven not to be allergic to Lidocaine.         Recent Visits Date Type Provider Dept  02/23/20 Office Visit [name], MD [facility]  Showing recent visits within past 90 days and meeting all other requirements Today's Visits Date Type Provider Dept  05/22/20 Office Visit [name], MD [facility]  Showing today's visits and meeting all other requirements Future Appointments No visits were found meeting these conditions. Showing future appointments within next 90 days and meeting all other requirements  I discussed the assessment and treatment plan with the patient. The patient was provided an opportunity to ask questions and all were answered. The patient agreed with the plan and demonstrated an  understanding of the instructions.  Patient advised to call back or seek an in-person evaluation if the symptoms or condition worsens.  Duration of encounter: 30 minutes.  Note by: Gaspar Cola, MD Date: 05/22/2020; Time: 9:06 AM

## 2020-05-22 ENCOUNTER — Encounter: Payer: Self-pay | Admitting: Pain Medicine

## 2020-05-22 ENCOUNTER — Other Ambulatory Visit: Payer: Self-pay

## 2020-05-22 ENCOUNTER — Ambulatory Visit: Payer: Medicare Other | Attending: Pain Medicine | Admitting: Pain Medicine

## 2020-05-22 VITALS — BP 126/93 | HR 69 | Temp 97.2°F | Resp 18 | Ht 70.0 in | Wt 209.0 lb

## 2020-05-22 DIAGNOSIS — M79601 Pain in right arm: Secondary | ICD-10-CM | POA: Diagnosis not present

## 2020-05-22 DIAGNOSIS — Z79899 Other long term (current) drug therapy: Secondary | ICD-10-CM

## 2020-05-22 DIAGNOSIS — M79605 Pain in left leg: Secondary | ICD-10-CM | POA: Diagnosis not present

## 2020-05-22 DIAGNOSIS — M79602 Pain in left arm: Secondary | ICD-10-CM | POA: Insufficient documentation

## 2020-05-22 DIAGNOSIS — M25552 Pain in left hip: Secondary | ICD-10-CM | POA: Diagnosis not present

## 2020-05-22 DIAGNOSIS — G8929 Other chronic pain: Secondary | ICD-10-CM

## 2020-05-22 DIAGNOSIS — F1120 Opioid dependence, uncomplicated: Secondary | ICD-10-CM

## 2020-05-22 DIAGNOSIS — M5441 Lumbago with sciatica, right side: Secondary | ICD-10-CM | POA: Insufficient documentation

## 2020-05-22 DIAGNOSIS — G894 Chronic pain syndrome: Secondary | ICD-10-CM

## 2020-05-22 DIAGNOSIS — M5416 Radiculopathy, lumbar region: Secondary | ICD-10-CM | POA: Insufficient documentation

## 2020-05-22 DIAGNOSIS — M5442 Lumbago with sciatica, left side: Secondary | ICD-10-CM | POA: Insufficient documentation

## 2020-05-22 DIAGNOSIS — M5412 Radiculopathy, cervical region: Secondary | ICD-10-CM | POA: Insufficient documentation

## 2020-05-22 MED ORDER — MORPHINE SULFATE 15 MG PO TABS: 15.0000 mg | ORAL_TABLET | Freq: Four times a day (QID) | ORAL | 0 refills | Status: DC | PRN

## 2020-05-22 MED ORDER — NONFORMULARY OR COMPOUNDED ITEM: 1.0000 mL | Freq: Four times a day (QID) | 99 refills | Status: DC | PRN

## 2020-05-22 NOTE — Progress Notes (Signed)
Nursing Pain Medication Assessment:  Safety precautions to be maintained throughout the outpatient stay will include: orient to surroundings, keep bed in low position, maintain call bell within reach at all times, provide assistance with transfer out of bed and ambulation.  Medication Inspection Compliance: Pill count conducted under aseptic conditions, in front of the patient. Neither the pills nor the bottle was removed from the patient's sight at any time. Once count was completed pills were immediately returned to the patient in their original bottle.  Medication: Morphine IR Pill/Patch Count: 11 of 120 pills remain Pill/Patch Appearance: Markings consistent with prescribed medication Bottle Appearance: Standard pharmacy container. Clearly labeled. Filled Date: 02 / 21 / 2022 Last Medication intake:  Today

## 2020-05-24 ENCOUNTER — Inpatient Hospital Stay (HOSPITAL_BASED_OUTPATIENT_CLINIC_OR_DEPARTMENT_OTHER): Payer: Medicare Other | Admitting: Oncology

## 2020-05-24 ENCOUNTER — Encounter: Payer: Self-pay | Admitting: Oncology

## 2020-05-24 ENCOUNTER — Inpatient Hospital Stay: Payer: Medicare Other

## 2020-05-24 VITALS — BP 127/85 | HR 65 | Resp 20 | Wt 214.7 lb

## 2020-05-24 VITALS — Temp 95.8°F

## 2020-05-24 DIAGNOSIS — Z5112 Encounter for antineoplastic immunotherapy: Secondary | ICD-10-CM | POA: Diagnosis not present

## 2020-05-24 DIAGNOSIS — C3492 Malignant neoplasm of unspecified part of left bronchus or lung: Secondary | ICD-10-CM | POA: Diagnosis not present

## 2020-05-24 DIAGNOSIS — F17210 Nicotine dependence, cigarettes, uncomplicated: Secondary | ICD-10-CM | POA: Diagnosis not present

## 2020-05-24 DIAGNOSIS — R059 Cough, unspecified: Secondary | ICD-10-CM | POA: Diagnosis not present

## 2020-05-24 DIAGNOSIS — C3432 Malignant neoplasm of lower lobe, left bronchus or lung: Secondary | ICD-10-CM | POA: Diagnosis not present

## 2020-05-24 DIAGNOSIS — Z886 Allergy status to analgesic agent status: Secondary | ICD-10-CM | POA: Diagnosis not present

## 2020-05-24 DIAGNOSIS — E1122 Type 2 diabetes mellitus with diabetic chronic kidney disease: Secondary | ICD-10-CM | POA: Diagnosis not present

## 2020-05-24 DIAGNOSIS — G8929 Other chronic pain: Secondary | ICD-10-CM | POA: Diagnosis not present

## 2020-05-24 DIAGNOSIS — Z8249 Family history of ischemic heart disease and other diseases of the circulatory system: Secondary | ICD-10-CM | POA: Diagnosis not present

## 2020-05-24 DIAGNOSIS — R110 Nausea: Secondary | ICD-10-CM | POA: Diagnosis not present

## 2020-05-24 DIAGNOSIS — Z79899 Other long term (current) drug therapy: Secondary | ICD-10-CM | POA: Diagnosis not present

## 2020-05-24 DIAGNOSIS — Z833 Family history of diabetes mellitus: Secondary | ICD-10-CM | POA: Diagnosis not present

## 2020-05-24 DIAGNOSIS — N181 Chronic kidney disease, stage 1: Secondary | ICD-10-CM | POA: Diagnosis not present

## 2020-05-24 DIAGNOSIS — R0602 Shortness of breath: Secondary | ICD-10-CM | POA: Diagnosis not present

## 2020-05-24 DIAGNOSIS — Z880 Allergy status to penicillin: Secondary | ICD-10-CM | POA: Diagnosis not present

## 2020-05-24 DIAGNOSIS — Z809 Family history of malignant neoplasm, unspecified: Secondary | ICD-10-CM | POA: Diagnosis not present

## 2020-05-24 DIAGNOSIS — Z9049 Acquired absence of other specified parts of digestive tract: Secondary | ICD-10-CM | POA: Diagnosis not present

## 2020-05-24 DIAGNOSIS — I700 Atherosclerosis of aorta: Secondary | ICD-10-CM | POA: Diagnosis not present

## 2020-05-24 DIAGNOSIS — D649 Anemia, unspecified: Secondary | ICD-10-CM | POA: Diagnosis not present

## 2020-05-24 LAB — COMPREHENSIVE METABOLIC PANEL
ALT: 21 U/L (ref 0–44)
AST: 23 U/L (ref 15–41)
Albumin: 4.1 g/dL (ref 3.5–5.0)
Alkaline Phosphatase: 127 U/L — ABNORMAL HIGH (ref 38–126)
Anion gap: 9 (ref 5–15)
BUN: 11 mg/dL (ref 6–20)
CO2: 28 mmol/L (ref 22–32)
Calcium: 8.8 mg/dL — ABNORMAL LOW (ref 8.9–10.3)
Chloride: 100 mmol/L (ref 98–111)
Creatinine, Ser: 0.64 mg/dL (ref 0.61–1.24)
GFR, Estimated: >60 mL/min (ref >60)
Glucose, Bld: 133 mg/dL — ABNORMAL HIGH (ref 70–99)
Potassium: 3.5 mmol/L (ref 3.5–5.1)
Sodium: 137 mmol/L (ref 135–145)
Total Bilirubin: 0.6 mg/dL (ref 0.3–1.2)
Total Protein: 7.5 g/dL (ref 6.5–8.1)

## 2020-05-24 LAB — CBC WITH DIFFERENTIAL/PLATELET
Abs Immature Granulocytes: 0.03 10*3/uL (ref 0.00–0.07)
Basophils Absolute: 0 10*3/uL (ref 0.0–0.1)
Basophils Relative: 1 %
Eosinophils Absolute: 0.1 10*3/uL (ref 0.0–0.5)
Eosinophils Relative: 2 %
HCT: 36.8 % — ABNORMAL LOW (ref 39.0–52.0)
Hemoglobin: 12.2 g/dL — ABNORMAL LOW (ref 13.0–17.0)
Immature Granulocytes: 1 %
Lymphocytes Relative: 30 %
Lymphs Abs: 1.6 10*3/uL (ref 0.7–4.0)
MCH: 29.1 pg (ref 26.0–34.0)
MCHC: 33.2 g/dL (ref 30.0–36.0)
MCV: 87.8 fL (ref 80.0–100.0)
Monocytes Absolute: 0.4 10*3/uL (ref 0.1–1.0)
Monocytes Relative: 8 %
Neutro Abs: 3.1 10*3/uL (ref 1.7–7.7)
Neutrophils Relative %: 58 %
Platelets: 208 10*3/uL (ref 150–400)
RBC: 4.19 MIL/uL — ABNORMAL LOW (ref 4.22–5.81)
RDW: 13.5 % (ref 11.5–15.5)
WBC: 5.2 10*3/uL (ref 4.0–10.5)
nRBC: 0 % (ref 0.0–0.2)

## 2020-05-24 LAB — TSH: TSH: 0.789 u[IU]/mL (ref 0.350–4.500)

## 2020-05-24 MED ORDER — SODIUM CHLORIDE 0.9% FLUSH
10.0000 mL | INTRAVENOUS | Status: DC | PRN
  Administered 2020-05-24: 10 mL
  Filled 2020-05-24: qty 10

## 2020-05-24 MED ORDER — SODIUM CHLORIDE 0.9 % IV SOLN
Freq: Once | INTRAVENOUS | Status: AC
Start: 2020-05-24 — End: 2020-05-24
  Filled 2020-05-24: qty 250

## 2020-05-24 MED ORDER — HEPARIN SOD (PORK) LOCK FLUSH 100 UNIT/ML IV SOLN
500.0000 [IU] | Freq: Once | INTRAVENOUS | Status: AC | PRN
  Administered 2020-05-24: 500 [IU]
  Filled 2020-05-24: qty 5

## 2020-05-24 MED ORDER — SODIUM CHLORIDE 0.9 % IV SOLN
10.0000 mg/kg | Freq: Once | INTRAVENOUS | Status: AC
  Administered 2020-05-24: 1000 mg via INTRAVENOUS
  Filled 2020-05-24: qty 20

## 2020-05-24 MED ORDER — HEPARIN SOD (PORK) LOCK FLUSH 100 UNIT/ML IV SOLN
INTRAVENOUS | Status: AC
  Filled 2020-05-24: qty 5

## 2020-05-25 LAB — T4: T4, Total: 8 ug/dL (ref 4.5–12.0)

## 2020-05-29 ENCOUNTER — Encounter: Payer: Self-pay | Admitting: Family Medicine

## 2020-05-29 ENCOUNTER — Other Ambulatory Visit: Payer: Self-pay

## 2020-05-29 ENCOUNTER — Ambulatory Visit (INDEPENDENT_AMBULATORY_CARE_PROVIDER_SITE_OTHER): Payer: Medicare Other | Admitting: Family Medicine

## 2020-05-29 VITALS — BP 118/82 | HR 70 | Temp 97.8°F | Wt 213.8 lb

## 2020-05-29 DIAGNOSIS — J439 Emphysema, unspecified: Secondary | ICD-10-CM | POA: Diagnosis not present

## 2020-05-29 DIAGNOSIS — N181 Chronic kidney disease, stage 1: Secondary | ICD-10-CM | POA: Diagnosis not present

## 2020-05-29 DIAGNOSIS — E1169 Type 2 diabetes mellitus with other specified complication: Secondary | ICD-10-CM

## 2020-05-29 DIAGNOSIS — E1122 Type 2 diabetes mellitus with diabetic chronic kidney disease: Secondary | ICD-10-CM | POA: Diagnosis not present

## 2020-05-29 DIAGNOSIS — C3490 Malignant neoplasm of unspecified part of unspecified bronchus or lung: Secondary | ICD-10-CM

## 2020-05-29 DIAGNOSIS — E785 Hyperlipidemia, unspecified: Secondary | ICD-10-CM

## 2020-05-29 DIAGNOSIS — I129 Hypertensive chronic kidney disease with stage 1 through stage 4 chronic kidney disease, or unspecified chronic kidney disease: Secondary | ICD-10-CM | POA: Diagnosis not present

## 2020-05-29 DIAGNOSIS — I700 Atherosclerosis of aorta: Secondary | ICD-10-CM | POA: Diagnosis not present

## 2020-05-29 MED ORDER — EMPAGLIFLOZIN 25 MG PO TABS: 25.0000 mg | ORAL_TABLET | Freq: Every day | ORAL | 1 refills | Status: DC

## 2020-05-29 MED ORDER — METFORMIN HCL 500 MG PO TABS: 1000.0000 mg | ORAL_TABLET | Freq: Two times a day (BID) | ORAL | 1 refills | Status: DC

## 2020-05-29 MED ORDER — NORTRIPTYLINE HCL 25 MG PO CAPS: 50.0000 mg | ORAL_CAPSULE | Freq: Every day | ORAL | 1 refills | Status: DC

## 2020-05-29 MED ORDER — AMLODIPINE BESYLATE 5 MG PO TABS: 5.0000 mg | ORAL_TABLET | Freq: Two times a day (BID) | ORAL | 1 refills | Status: DC

## 2020-05-29 MED ORDER — HYDRALAZINE HCL 100 MG PO TABS: 100.0000 mg | ORAL_TABLET | Freq: Two times a day (BID) | ORAL | 1 refills | Status: DC

## 2020-05-29 MED ORDER — METOPROLOL TARTRATE 50 MG PO TABS: 50.0000 mg | ORAL_TABLET | Freq: Two times a day (BID) | ORAL | 1 refills | Status: DC

## 2020-05-29 NOTE — Assessment & Plan Note (Signed)
Will keep BP, sugars and cholesterol under good control. Continue to monitor. Call with any concerns.

## 2020-05-29 NOTE — Assessment & Plan Note (Signed)
Up slightly with A1c of 6.7 up from 5.2- continue current regimen. Continue to monitor. Call with any concerns. Refills given.

## 2020-05-29 NOTE — Assessment & Plan Note (Signed)
Under good control on current regimen. Continue current regimen. Continue to monitor. Call with any concerns. Refills given.   

## 2020-05-29 NOTE — Assessment & Plan Note (Signed)
Continue to follow with oncology. Call with any concerns. Continue to monitor.

## 2020-05-29 NOTE — Progress Notes (Signed)
BP 118/82   Pulse 70   Temp 97.8 F (36.6 C)   Wt 213 lb 12.8 oz (97 kg)   SpO2 98%   BMI 30.68 kg/m    Subjective:    Patient ID: Kristopher [name], male    DOB: 1960-04-08, 60 y.o.   MRN: [id number]  HPI: Kristopher [name] is a 60 y.o. male  Chief Complaint  Patient presents with  . Diabetes   HYPERTENSION / HYPERLIPIDEMIA Satisfied with current treatment? yes Duration of hypertension: chronic BP monitoring frequency: not checking BP medication side effects: no Past BP meds: amlodipine, metoprolol, hydralazine Duration of hyperlipidemia: chronic Cholesterol medication side effects: no Cholesterol supplements: none Past cholesterol medications: atorvastatin Medication compliance: excellent compliance Aspirin: no Recent stressors: yes Recurrent headaches: no Visual changes: no Palpitations: no Dyspnea: no Chest pain: no Lower extremity edema: no Dizzy/lightheaded: no  DIABETES Hypoglycemic episodes:no Polydipsia/polyuria: no Visual disturbance: no Chest pain: no Paresthesias: no Glucose Monitoring: yes  Accucheck frequency: occasionally Taking Insulin?: no Blood Pressure Monitoring: not checking Retinal Examination: Up to Date Foot Exam: Up to Date Diabetic Education: Completed Pneumovax: Up to Date Influenza: Up to Date Aspirin: no   Treatments for lung cancer have been going well. Tolerating them well. No concerns.   Relevant past medical, surgical, family and social history reviewed and updated as indicated. Interim medical history since our last visit reviewed. Allergies and medications reviewed and updated.  Review of Systems  Constitutional: Negative.   Respiratory: Negative.   Cardiovascular: Negative.   Gastrointestinal: Negative.   Musculoskeletal: Negative.   Neurological: Negative.   Psychiatric/Behavioral: Negative.     Per HPI unless specifically indicated above     Objective:    BP 118/82   Pulse 70   Temp 97.8 F (36.6  C)   Wt 213 lb 12.8 oz (97 kg)   SpO2 98%   BMI 30.68 kg/m   Wt Readings from Last 3 Encounters:  05/29/20 213 lb 12.8 oz (97 kg)  05/24/20 214 lb 11.2 oz (97.4 kg)  05/22/20 209 lb (94.8 kg)    Physical Exam Vitals and nursing note reviewed.  Constitutional:      General: He is not in acute distress.    Appearance: Normal appearance. He is not ill-appearing, toxic-appearing or diaphoretic.  HENT:     Head: Normocephalic and atraumatic.     Right Ear: External ear normal.     Left Ear: External ear normal.     Nose: Nose normal.     Mouth/Throat:     Mouth: Mucous membranes are moist.     Pharynx: Oropharynx is clear.  Eyes:     General: No scleral icterus.       Right eye: No discharge.        Left eye: No discharge.     Extraocular Movements: Extraocular movements intact.     Conjunctiva/sclera: Conjunctivae normal.     Pupils: Pupils are equal, round, and reactive to light.  Cardiovascular:     Rate and Rhythm: Normal rate and regular rhythm.     Pulses: Normal pulses.     Heart sounds: Normal heart sounds. No murmur heard. No friction rub. No gallop.   Pulmonary:     Effort: Pulmonary effort is normal. No respiratory distress.     Breath sounds: Normal breath sounds. No stridor. No wheezing, rhonchi or rales.  Chest:     Chest wall: No tenderness.  Musculoskeletal:        General:  Normal range of motion.     Cervical back: Normal range of motion and neck supple.  Skin:    General: Skin is warm and dry.     Capillary Refill: Capillary refill takes less than 2 seconds.     Coloration: Skin is not jaundiced or pale.     Findings: No bruising, erythema, lesion or rash.  Neurological:     General: No focal deficit present.     Mental Status: He is alert and oriented to person, place, and time. Mental status is at baseline.  Psychiatric:        Mood and Affect: Mood normal.        Behavior: Behavior normal.        Thought Content: Thought content normal.         Judgment: Judgment normal.     Results for orders placed or performed in visit on 05/24/20  T4  Result Value Ref Range   T4, Total 8.0 4.5 - 12.0 ug/dL  TSH  Result Value Ref Range   TSH 0.789 0.350 - 4.500 uIU/mL  Comprehensive metabolic panel  Result Value Ref Range   Sodium 137 135 - 145 mmol/L   Potassium 3.5 3.5 - 5.1 mmol/L   Chloride 100 98 - 111 mmol/L   CO2 28 22 - 32 mmol/L   Glucose, Bld 133 (H) 70 - 99 mg/dL   BUN 11 6 - 20 mg/dL   Creatinine, Ser 0.64 0.61 - 1.24 mg/dL   Calcium 8.8 (L) 8.9 - 10.3 mg/dL   Total Protein 7.5 6.5 - 8.1 g/dL   Albumin 4.1 3.5 - 5.0 g/dL   AST 23 15 - 41 U/L   ALT 21 0 - 44 U/L   Alkaline Phosphatase 127 (H) 38 - 126 U/L   Total Bilirubin 0.6 0.3 - 1.2 mg/dL   GFR, Estimated >60 >60 mL/min   Anion gap 9 5 - 15  CBC with Differential/Platelet  Result Value Ref Range   WBC 5.2 4.0 - 10.5 K/uL   RBC 4.19 (L) 4.22 - 5.81 MIL/uL   Hemoglobin 12.2 (L) 13.0 - 17.0 g/dL   HCT 36.8 (L) 39.0 - 52.0 %   MCV 87.8 80.0 - 100.0 fL   MCH 29.1 26.0 - 34.0 pg   MCHC 33.2 30.0 - 36.0 g/dL   RDW 13.5 11.5 - 15.5 %   Platelets 208 150 - 400 K/uL   nRBC 0.0 0.0 - 0.2 %   Neutrophils Relative % 58 %   Neutro Abs 3.1 1.7 - 7.7 K/uL   Lymphocytes Relative 30 %   Lymphs Abs 1.6 0.7 - 4.0 K/uL   Monocytes Relative 8 %   Monocytes Absolute 0.4 0.1 - 1.0 K/uL   Eosinophils Relative 2 %   Eosinophils Absolute 0.1 0.0 - 0.5 K/uL   Basophils Relative 1 %   Basophils Absolute 0.0 0.0 - 0.1 K/uL   Immature Granulocytes 1 %   Abs Immature Granulocytes 0.03 0.00 - 0.07 K/uL      Assessment & Plan:   Problem List Items Addressed This Visit      Cardiovascular and Mediastinum   Aortic atherosclerosis ([place])    Will keep BP, sugars and cholesterol under good control. Continue to monitor. Call with any concerns.       Relevant Medications   metoprolol tartrate (LOPRESSOR) 50 MG tablet   hydrALAZINE (APRESOLINE) 100 MG tablet   amLODipine (NORVASC)  5 MG tablet     Respiratory  Pulmonary emphysema ([place])    Under good control on current regimen. Continue current regimen. Continue to monitor. Call with any concerns. Refills given.        Malignant neoplasm of lung ([place])    Continue to follow with oncology. Call with any concerns. Continue to monitor.         Endocrine   Type 2 diabetes mellitus with stage 1 chronic kidney disease, without long-term current use of insulin ([facility]) - Primary    Up slightly with A1c of 6.7 up from 5.2- continue current regimen. Continue to monitor. Call with any concerns. Refills given.       Relevant Medications   metFORMIN (GLUCOPHAGE) 500 MG tablet   empagliflozin (JARDIANCE) 25 MG TABS tablet   Other Relevant Orders   Bayer DCA Hb A1c Waived   Hyperlipidemia associated with type 2 diabetes mellitus ([place])    Under good control on current regimen. Continue current regimen. Continue to monitor. Call with any concerns. Refills given.       Relevant Medications   metFORMIN (GLUCOPHAGE) 500 MG tablet   empagliflozin (JARDIANCE) 25 MG TABS tablet     Genitourinary   Benign hypertensive renal disease    Under good control on current regimen. Continue current regimen. Continue to monitor. Call with any concerns. Refills given.           Follow up plan: Return in about 3 months (around 08/29/2020) for physical.

## 2020-05-30 ENCOUNTER — Other Ambulatory Visit: Payer: Self-pay | Admitting: Family Medicine

## 2020-05-30 LAB — BAYER DCA HB A1C WAIVED: HB A1C (BAYER DCA - WAIVED): 6.7 % (ref <7.0)

## 2020-05-30 LAB — TOXASSURE SELECT 13 (MW), URINE

## 2020-06-07 ENCOUNTER — Inpatient Hospital Stay: Payer: Medicare Other | Attending: Oncology

## 2020-06-07 ENCOUNTER — Ambulatory Visit: Payer: Medicare Other

## 2020-06-07 VITALS — BP 118/84 | HR 68 | Temp 97.1°F | Resp 16 | Wt 214.0 lb

## 2020-06-07 DIAGNOSIS — Z886 Allergy status to analgesic agent status: Secondary | ICD-10-CM | POA: Diagnosis not present

## 2020-06-07 DIAGNOSIS — Z880 Allergy status to penicillin: Secondary | ICD-10-CM | POA: Insufficient documentation

## 2020-06-07 DIAGNOSIS — C3432 Malignant neoplasm of lower lobe, left bronchus or lung: Secondary | ICD-10-CM | POA: Insufficient documentation

## 2020-06-07 DIAGNOSIS — Z9049 Acquired absence of other specified parts of digestive tract: Secondary | ICD-10-CM | POA: Insufficient documentation

## 2020-06-07 DIAGNOSIS — F17210 Nicotine dependence, cigarettes, uncomplicated: Secondary | ICD-10-CM | POA: Insufficient documentation

## 2020-06-07 DIAGNOSIS — G8929 Other chronic pain: Secondary | ICD-10-CM | POA: Diagnosis not present

## 2020-06-07 DIAGNOSIS — Z902 Acquired absence of lung [part of]: Secondary | ICD-10-CM | POA: Insufficient documentation

## 2020-06-07 DIAGNOSIS — Z79899 Other long term (current) drug therapy: Secondary | ICD-10-CM | POA: Insufficient documentation

## 2020-06-07 DIAGNOSIS — Z833 Family history of diabetes mellitus: Secondary | ICD-10-CM | POA: Insufficient documentation

## 2020-06-07 DIAGNOSIS — C3492 Malignant neoplasm of unspecified part of left bronchus or lung: Secondary | ICD-10-CM

## 2020-06-07 DIAGNOSIS — E119 Type 2 diabetes mellitus without complications: Secondary | ICD-10-CM | POA: Diagnosis not present

## 2020-06-07 DIAGNOSIS — Z8249 Family history of ischemic heart disease and other diseases of the circulatory system: Secondary | ICD-10-CM | POA: Diagnosis not present

## 2020-06-07 DIAGNOSIS — R110 Nausea: Secondary | ICD-10-CM | POA: Diagnosis not present

## 2020-06-07 DIAGNOSIS — Z809 Family history of malignant neoplasm, unspecified: Secondary | ICD-10-CM | POA: Insufficient documentation

## 2020-06-07 DIAGNOSIS — E876 Hypokalemia: Secondary | ICD-10-CM | POA: Diagnosis not present

## 2020-06-07 DIAGNOSIS — I700 Atherosclerosis of aorta: Secondary | ICD-10-CM | POA: Insufficient documentation

## 2020-06-07 DIAGNOSIS — Z5112 Encounter for antineoplastic immunotherapy: Secondary | ICD-10-CM | POA: Insufficient documentation

## 2020-06-07 MED ORDER — HEPARIN SOD (PORK) LOCK FLUSH 100 UNIT/ML IV SOLN
500.0000 [IU] | Freq: Once | INTRAVENOUS | Status: AC | PRN
  Administered 2020-06-07: 500 [IU]
  Filled 2020-06-07: qty 5

## 2020-06-07 MED ORDER — HEPARIN SOD (PORK) LOCK FLUSH 100 UNIT/ML IV SOLN
INTRAVENOUS | Status: AC
  Filled 2020-06-07: qty 5

## 2020-06-07 MED ORDER — SODIUM CHLORIDE 0.9 % IV SOLN
10.0000 mg/kg | Freq: Once | INTRAVENOUS | Status: AC
  Administered 2020-06-07: 1000 mg via INTRAVENOUS
  Filled 2020-06-07: qty 20

## 2020-06-07 MED ORDER — SODIUM CHLORIDE 0.9 % IV SOLN
Freq: Once | INTRAVENOUS | Status: AC
  Filled 2020-06-07: qty 250

## 2020-06-13 ENCOUNTER — Telehealth: Payer: Self-pay

## 2020-06-13 NOTE — Chronic Care Management (AMB) (Signed)
Chronic Care Management Pharmacy Assistant   Name: Kristopher [name]  MRN: [id number] DOB: 1960/11/24  Reason for Encounter: Diabetes Mellitus Disease State Call   Recent office visits:  05/29/20- Park Liter, DO- General chronic conditions addressed, follow up 3 months 05/10/20- Jon Billings, NP ( Video Visit)- Viral upper respiratory tract infection, short course azithromycin 250 mg, xray ordered  02/27/21- Park Liter, DO- Type 2 DM, encounter noted pt "Will stop meds and recheck 3 months" following improved A1C  Recent consult visits:  05/22/20- [name], MD ( Pain Medicine)- medication list updated, started morphine 15 mg, started 10% Ketamine/2% Cyclobenzaprine/6% Gabapentin Cream, follow up 3 months  04/12/20- Faud Aleskerov ( Pulmonology)- Malignant neoplasm of upper lobe of left lung 02/23/20-Francisco Dossie Arbour, MD ( Pain Medicine)- started  morphine 15 mg, follow up 13 weeks  Hospital visits:  None in previous 6 months  Medications: Outpatient Encounter Medications as of 06/13/2020  Medication Sig Note  . Accu-Chek FastClix Lancets MISC USE TO TEST BLOOD SUGAR 2X A DAY   . albuterol (VENTOLIN HFA) 108 (90 Base) MCG/ACT inhaler Inhale into the lungs.   [name] amLODipine (NORVASC) 5 MG tablet Take 1 tablet (5 mg total) by mouth 2 (two) times daily.   . ARNUITY ELLIPTA 100 MCG/ACT AEPB Inhale 1 puff into the lungs daily.    [name] atorvastatin (LIPITOR) 10 MG tablet TAKE 1 TABLET (10 MG TOTAL) BY MOUTH DAILY AT 6 PM.   . BREZTRI AEROSPHERE 160-9-4.8 MCG/ACT AERO Inhale 2 puffs into the lungs 2 (two) times daily. Maintenance per pulmonology   . diphenhydrAMINE (BENADRYL) 25 MG tablet Take 50 mg by mouth daily.   . empagliflozin (JARDIANCE) 25 MG TABS tablet Take 1 tablet (25 mg total) by mouth daily before breakfast.   . esomeprazole (NEXIUM) 40 MG capsule Take 1 capsule (40 mg total) by mouth daily.   [name] glucose blood (ACCU-CHEK GUIDE) test strip USE TO TEST BLOOD SUGAR  2X A DAY   . hydrALAZINE (APRESOLINE) 100 MG tablet Take 1 tablet (100 mg total) by mouth 2 (two) times daily.   [name] lidocaine (LIDODERM) 5 % Place 1 patch onto the skin daily. Remove & Discard patch within 12 hours or as directed by MD (Patient taking differently: Place 1 patch onto the skin daily as needed (pain). Remove & Discard patch within 12 hours or as directed by MD)   . lidocaine-prilocaine (EMLA) cream Apply 1 application topically as needed. Apply small amount to port site at least 1 hour prior to it being accessed, cover with plastic wrap   . metFORMIN (GLUCOPHAGE) 500 MG tablet Take 2 tablets (1,000 mg total) by mouth 2 (two) times daily with a meal.   . metoprolol tartrate (LOPRESSOR) 50 MG tablet Take 1 tablet (50 mg total) by mouth 2 (two) times daily.   . montelukast (SINGULAIR) 10 MG tablet Take 10 mg by mouth daily.   [name] morphine (MSIR) 15 MG tablet Take 1 tablet (15 mg total) by mouth every 6 (six) hours as needed for moderate pain or severe pain. Must last 30 days.   Derrill Memo ON 06/23/2020] morphine (MSIR) 15 MG tablet Take 1 tablet (15 mg total) by mouth every 6 (six) hours as needed for moderate pain or severe pain. Must last 30 days.   Derrill Memo ON 07/23/2020] morphine (MSIR) 15 MG tablet Take 1 tablet (15 mg total) by mouth every 6 (six) hours as needed for moderate pain or severe pain. Must last 30  days. 05/22/2020: FUTURE Prescription. (NOT a DUPLICATE!!) >>>DO NOT DELETE<<< (even if Expired!) See Care Coordination Note from [facility] (Dr. Dossie Arbour)   . Multiple Vitamin (MULTIVITAMIN WITH MINERALS) TABS tablet Take 1 tablet by mouth daily.   Derrill Memo ON 07/09/2020] NONFORMULARY OR COMPOUNDED ITEM Apply 1-2 mLs topically 4 (four) times daily as needed. 10% Ketamine/2% Cyclobenzaprine/6% Gabapentin Cream   . nortriptyline (PAMELOR) 25 MG capsule Take 2 capsules (50 mg total) by mouth at bedtime.   . pantoprazole (PROTONIX) 40 MG tablet Take 1 tablet (40 mg total) by mouth  daily.   . Potassium Chloride ER 20 MEQ TBCR TAKE 1 TABLET BY MOUTH EVERY DAY   . STIOLTO RESPIMAT 2.5-2.5 MCG/ACT AERS Inhale 2 puffs into the lungs daily. Prn per pulmonology notes   . SUMAtriptan (IMITREX) 50 MG tablet Take 1 tablet (50 mg total) by mouth every 2 (two) hours as needed for migraine. Take 1 tab at onset of migraine. May repeat in 2 hours if headache persists or recurs.    Facility-Administered Encounter Medications as of 06/13/2020  Medication  . 0.9 %  sodium chloride infusion  . sodium chloride flush (NS) 0.9 % injection 10 mL   Recent Relevant Labs: Lab Results  Component Value Date/Time   HGBA1C 6.7 05/29/2020 08:39 AM   HGBA1C 5.2 02/28/2020 03:26 PM   MICROALBUR 150 (H) 04/27/2019 08:07 AM   MICROALBUR 80 (H) 04/08/2018 11:03 AM    Kidney Function Lab Results  Component Value Date/Time   CREATININE 0.64 05/24/2020 09:52 AM   CREATININE 0.85 04/26/2020 09:51 AM   GFRNONAA >60 05/24/2020 09:52 AM   GFRAA >60 12/01/2019 08:15 AM   Current antihyperglycemic regimen:  Metformin 500 MG- Take 2 tablets twice daily with a meal  Patient taking differently: Taking 1 tablet prn when BS is elevated, per provider instructions Jardiance 25MG  -Take 1 tablet by mouth daily before breakfast  What recent interventions/DTPs have been made to improve glycemic control:  No interventions noted   Have there been any recent hospitalizations or ED visits since last visit with CPP? No   Patient denies hypoglycemic symptoms, including Pale, Sweaty, Shaky, Hungry, Nervous/irritable and Vision changes   Patient denies hyperglycemic symptoms, including blurry vision, excessive thirst, fatigue, polyuria and weakness   How often are you checking your blood sugar?  Patient stated he checks his blood sugar regularly.  What are your blood sugars ranging?  o Fasting: 120-128, 134 o Before meals: n/a o After meals: n/a o Bedtime: n/a  During the week, how often does your blood  glucose drop below 70? Never   Are you checking your feet daily/regularly?  Patient stated he checks his feet daily and they look good.  Adherence Review: Is the patient currently on a STATIN medication? Yes   Is the patient currently on ACE/ARB medication? No   Does the patient have >5 day gap between last estimated fill dates? No Metformin 500mg - 90DS last filled 05/29/20 Jardiance 25mg - 90DS last filled 05/29/20  Patient stated that he has been doing well overall besides the migraines he has been having. He has also been doing well with his chemo treatments that he goes to every other Wednesday. Mr. Guardia did explain that it does make him feel unwell for about a day and a half after, but he recovers well. He stated he checks his blood pressure daily and his BS is well managed. He takes his medication differently as noted above.  Star Rating Drugs: Atorvastatin  10mg - 90 DS last filled 05/20/20   [facility] [place], [place]

## 2020-06-16 ENCOUNTER — Ambulatory Visit: Payer: Medicare Other | Admitting: Radiation Oncology

## 2020-06-18 NOTE — Progress Notes (Signed)
[place]  [phone] [phone]  ID: Kristopher [name] OB: [birth year]  MR#: [id number]  DTH#:[id number]  Patient Care Team: [name], DO as PCP - General (Family Medicine) Gavin Pound, CMA (Inactive) as Certified Medical Assistant Telford Nab, RN as Registered Nurse Vanita Ingles, RN as Case Manager (General Practice) [name], MD as Referring Physician (Pain Medicine) [name], MD as Consulting Physician (Oncology)  CHIEF COMPLAINT: Clinical stage IIIa squamous cell 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  INTERVAL HISTORY: Patient returns to clinic today for further evaluation, discussion of his imaging results, and consideration of cycle 12 of maintenance durvalumab.  He continues to feel well and remains asymptomatic.  He is tolerating his treatments without significant side effects.  He has a good appetite and denies weight loss.  He has no neurologic complaints.  He denies any recent fevers or illnesses.  He has no chest pain, shortness of breath, cough, or hemoptysis.  He denies any nausea, vomiting, constipation, or diarrhea.  He has no urinary complaints.  Patient feels at his baseline offers no specific complaints today.  REVIEW OF SYSTEMS:   Review of Systems  Constitutional: Negative.  Negative for fever, malaise/fatigue and weight loss.  Respiratory: Negative.  Negative for cough and shortness of breath.   Cardiovascular: Negative.  Negative for chest pain and leg swelling.  Gastrointestinal: Negative.  Negative for abdominal pain and heartburn.  Genitourinary: Negative.  Negative for dysuria and flank pain.  Musculoskeletal: Negative.  Negative for back pain.  Skin: Negative.  Negative for rash.  Neurological: Negative.  Negative for dizziness, focal weakness, weakness and headaches.  Psychiatric/Behavioral: Negative.  The patient is not nervous/anxious.     As per HPI. Otherwise, a  complete review of systems is negative.  PAST MEDICAL HISTORY: Past Medical History:  Diagnosis Date  . Allergy   . Arthritis    left foot  . Benign hypertensive kidney disease   . Chronic back pain    Four rods in back  . Diabetes mellitus, type 2 ([place])   . Dyspnea   . GERD (gastroesophageal reflux disease)   . Hypertension   . Malignant neoplasm of lung ([place])   . Migraines    daily    PAST SURGICAL HISTORY: Past Surgical History:  Procedure Laterality Date  . APPENDECTOMY    . BACK SURGERY    . COLONOSCOPY WITH PROPOFOL N/A 02/19/2016   Procedure: COLONOSCOPY WITH PROPOFOL;  Surgeon: Lucilla Lame, MD;  Location: [place];  Service: Endoscopy;  Laterality: N/A;  . COLONOSCOPY WITH PROPOFOL N/A 01/19/2018   Procedure: COLONOSCOPY WITH PROPOFOL;  Surgeon: Lucilla Lame, MD;  Location: [facility];  Service: Endoscopy;  Laterality: N/A;  Diabetic - oral meds  . DG OPERATIVE LEFT HIP ([place] [facility])     10/19  . ELECTROMAGNETIC NAVIGATION BROCHOSCOPY Left 11/18/2018   Procedure: ELECTROMAGNETIC NAVIGATION BRONCHOSCOPY;  Surgeon: Tyler Pita, MD;  Location: [facility];  Service: Cardiopulmonary;  Laterality: Left;  . FLEXIBLE BRONCHOSCOPY Bilateral 01/20/2019   Procedure: FLEXIBLE BRONCHOSCOPY;  Surgeon: Ottie Glazier, MD;  Location: [facility];  Service: Thoracic;  Laterality: Bilateral;  . FLEXIBLE BRONCHOSCOPY Bilateral 01/22/2019   Procedure: FLEXIBLE BRONCHOSCOPY;  Surgeon: Ottie Glazier, MD;  Location: [facility];  Service: Thoracic;  Laterality: Bilateral;  . FOOT SURGERY Left    Screws and plates  . JOINT REPLACEMENT Left 12/2017   DR Rudene Christians Hip  . KNEE SURGERY Left  X 2  . LEG SURGERY    . LUNG CANCER SURGERY    . POLYPECTOMY N/A 02/19/2016   Procedure: POLYPECTOMY;  Surgeon: Lucilla Lame, MD;  Location: [place];  Service: Endoscopy;  Laterality: N/A;  . POLYPECTOMY  01/19/2018   Procedure: POLYPECTOMY;  Surgeon: Lucilla Lame, MD;   Location: [place];  Service: Endoscopy;;  . PORTACATH PLACEMENT N/A 11/11/2019   Procedure: INSERTION PORT-A-CATH;  Surgeon: Nestor Lewandowsky, MD;  Location: [facility];  Service: General;  Laterality: N/A;  . THORACOTOMY Left 01/14/2019   Procedure: THORACOTOMY MAJOR, LEFT;  Surgeon: Nestor Lewandowsky, MD;  Location: [facility];  Service: General;  Laterality: Left;  . TOTAL HIP ARTHROPLASTY Left 12/02/2017   Procedure: TOTAL HIP ARTHROPLASTY ANTERIOR APPROACH;  Surgeon: Hessie Knows, MD;  Location: [facility];  Service: Orthopedics;  Laterality: Left;  [name] VIDEO BRONCHOSCOPY Left 01/14/2019   Procedure: VIDEO BRONCHOSCOPY WITH FLUORO, LEFT;  Surgeon: Nestor Lewandowsky, MD;  Location: [facility];  Service: General;  Laterality: Left;  [name] VIDEO BRONCHOSCOPY WITH ENDOBRONCHIAL NAVIGATION N/A 10/15/2019   Procedure: VIDEO BRONCHOSCOPY WITH ENDOBRONCHIAL NAVIGATION;  Surgeon: Ottie Glazier, MD;  Location: [facility];  Service: Thoracic;  Laterality: N/A;  . VIDEO BRONCHOSCOPY WITH ENDOBRONCHIAL ULTRASOUND N/A 10/15/2019   Procedure: VIDEO BRONCHOSCOPY WITH ENDOBRONCHIAL ULTRASOUND;  Surgeon: Ottie Glazier, MD;  Location: [facility];  Service: Thoracic;  Laterality: N/A;    FAMILY HISTORY: Family History  Problem Relation Age of Onset  . Cancer Father   . Diabetes Sister   . Thrombosis Sister     ADVANCED DIRECTIVES (Y/N):  N  HEALTH MAINTENANCE: Social History   Tobacco Use  . Smoking status: Current Every Day Smoker    Packs/day: 0.25    Years: 35.00    Pack years: 8.75    Types: Cigarettes    Last attempt to quit: 01/14/2019    Years since quitting: 1.4  . Smokeless tobacco: Never Used  Vaping Use  . Vaping Use: Never used  Substance Use Topics  . Alcohol use: No    Alcohol/week: 0.0 standard drinks  . Drug use: Yes    Types: Oxycodone    Comment: prescribed     Colonoscopy:  PAP:  Bone density:  Lipid panel:  Allergies  Allergen Reactions  . Acetaminophen Swelling  . Aspirin  Anaphylaxis  . Epinephrine Anaphylaxis    Does not include albuterol  . Novocain [Procaine] Anaphylaxis  . Penicillins Anaphylaxis    Has patient had a PCN reaction causing immediate rash, facial/tongue/throat swelling, SOB or lightheadedness with hypotension: Yes Has patient had a PCN reaction causing severe rash involving mucus membranes or skin necrosis: No Has patient had a PCN reaction that required hospitalization: Yes Has patient had a PCN reaction occurring within the last 10 years: No If all of the above answers are "NO", then may proceed with Cephalosporin use.   . Strawberry Extract Anaphylaxis  . Shellfish Allergy Hives and Nausea And Vomiting    Current Outpatient Medications  Medication Sig Dispense Refill  . Accu-Chek FastClix Lancets MISC USE TO TEST BLOOD SUGAR 2X A DAY 100 each 12  . albuterol (VENTOLIN HFA) 108 (90 Base) MCG/ACT inhaler Inhale into the lungs.    [name] amLODipine (NORVASC) 5 MG tablet Take 1 tablet (5 mg total) by mouth 2 (two) times daily. 180 tablet 1  . ARNUITY ELLIPTA 100 MCG/ACT AEPB Inhale 1 puff into the lungs daily.     [name] atorvastatin (LIPITOR) 10 MG tablet TAKE 1  TABLET (10 MG TOTAL) BY MOUTH DAILY AT 6 PM. 90 tablet 1  . BREZTRI AEROSPHERE 160-9-4.8 MCG/ACT AERO Inhale 2 puffs into the lungs 2 (two) times daily. Maintenance per pulmonology    . diphenhydrAMINE (BENADRYL) 25 MG tablet Take 50 mg by mouth daily.    . empagliflozin (JARDIANCE) 25 MG TABS tablet Take 1 tablet (25 mg total) by mouth daily before breakfast. 90 tablet 1  . esomeprazole (NEXIUM) 40 MG capsule Take 1 capsule (40 mg total) by mouth daily. 90 capsule 3  . glucose blood (ACCU-CHEK GUIDE) test strip USE TO TEST BLOOD SUGAR 2X A DAY 100 strip 3  . hydrALAZINE (APRESOLINE) 100 MG tablet Take 1 tablet (100 mg total) by mouth 2 (two) times daily. 180 tablet 1  . lidocaine (LIDODERM) 5 % Place 1 patch onto the skin daily. Remove & Discard patch within 12 hours or as directed by MD  (Patient taking differently: Place 1 patch onto the skin daily as needed (pain). Remove & Discard patch within 12 hours or as directed by MD) 30 patch 12  . lidocaine-prilocaine (EMLA) cream Apply 1 application topically as needed. Apply small amount to port site at least 1 hour prior to it being accessed, cover with plastic wrap 30 g 1  . metFORMIN (GLUCOPHAGE) 500 MG tablet Take 2 tablets (1,000 mg total) by mouth 2 (two) times daily with a meal. 360 tablet 1  . metoprolol tartrate (LOPRESSOR) 50 MG tablet Take 1 tablet (50 mg total) by mouth 2 (two) times daily. 180 tablet 1  . montelukast (SINGULAIR) 10 MG tablet Take 10 mg by mouth daily.    [name] morphine (MSIR) 15 MG tablet Take 1 tablet (15 mg total) by mouth every 6 (six) hours as needed for moderate pain or severe pain. Must last 30 days. 120 tablet 0  . Multiple Vitamin (MULTIVITAMIN WITH MINERALS) TABS tablet Take 1 tablet by mouth daily.    Derrill Memo ON 07/09/2020] NONFORMULARY OR COMPOUNDED ITEM Apply 1-2 mLs topically 4 (four) times daily as needed. 10% Ketamine/2% Cyclobenzaprine/6% Gabapentin Cream 240 each PRN  . nortriptyline (PAMELOR) 25 MG capsule Take 2 capsules (50 mg total) by mouth at bedtime. 180 capsule 1  . pantoprazole (PROTONIX) 40 MG tablet Take 1 tablet (40 mg total) by mouth daily. 15 tablet 2  . Potassium Chloride ER 20 MEQ TBCR TAKE 1 TABLET BY MOUTH EVERY DAY 90 tablet 1  . STIOLTO RESPIMAT 2.5-2.5 MCG/ACT AERS Inhale 2 puffs into the lungs daily. Prn per pulmonology notes    . SUMAtriptan (IMITREX) 50 MG tablet Take 1 tablet (50 mg total) by mouth every 2 (two) hours as needed for migraine. Take 1 tab at onset of migraine. May repeat in 2 hours if headache persists or recurs. 10 tablet 12  . [START ON 07/23/2020] morphine (MSIR) 15 MG tablet Take 1 tablet (15 mg total) by mouth every 6 (six) hours as needed for moderate pain or severe pain. Must last 30 days. 120 tablet 0   No current facility-administered medications for  this visit.   Facility-Administered Medications Ordered in Other Visits  Medication Dose Route Frequency Provider Last Rate Last Admin  . 0.9 %  sodium chloride infusion    Continuous PRN Dionne Bucy, CRNA   New Bag at 01/22/19 [phone]  . sodium chloride flush (NS) 0.9 % injection 10 mL  10 mL Intracatheter PRN [name], MD   10 mL at 03/01/20 0953    OBJECTIVE: Vitals:  06/21/20 0937  BP: 128/72  Pulse: 98  Resp: 18  Temp: (!) 96.5 F (35.8 C)  SpO2: 97%     Body mass index is 30.58 kg/m.    ECOG FS:0 - Asymptomatic  General: Well-developed, well-nourished, no acute distress. Eyes: Pink conjunctiva, anicteric sclera. HEENT: Normocephalic, moist mucous membranes. Lungs: No audible wheezing or coughing. Heart: Regular rate and rhythm. Abdomen: Soft, nontender, no obvious distention. Musculoskeletal: No edema, cyanosis, or clubbing. Neuro: Alert, answering all questions appropriately. Cranial nerves grossly intact. Skin: No rashes or petechiae noted. Psych: Normal affect.  LAB RESULTS:  Lab Results  Component Value Date   NA 140 06/21/2020   K 3.3 (L) 06/21/2020   CL 104 06/21/2020   CO2 25 06/21/2020   GLUCOSE 135 (H) 06/21/2020   BUN 8 06/21/2020   CREATININE 0.72 06/21/2020   CALCIUM 9.0 06/21/2020   PROT 7.5 06/21/2020   ALBUMIN 4.2 06/21/2020   AST 32 06/21/2020   ALT 25 06/21/2020   ALKPHOS 127 (H) 06/21/2020   BILITOT 0.6 06/21/2020   GFRNONAA >60 06/21/2020   GFRAA >60 12/01/2019    Lab Results  Component Value Date   WBC 5.0 06/21/2020   NEUTROABS 2.9 06/21/2020   HGB 12.8 (L) 06/21/2020   HCT 38.5 (L) 06/21/2020   MCV 89.7 06/21/2020   PLT 186 06/21/2020     STUDIES: CT Chest W Contrast  Result Date: 06/19/2020 CLINICAL DATA:  Non-small-cell lung cancer.  Restaging. EXAM: CT CHEST WITH CONTRAST TECHNIQUE: Multidetector CT imaging of the chest was performed during intravenous contrast administration. CONTRAST:  64m OMNIPAQUE  IOHEXOL 300 MG/ML  SOLN COMPARISON:  10/28/2019 FINDINGS: Cardiovascular: The heart size is normal. No substantial pericardial effusion. Atherosclerotic calcification is noted in the wall of the thoracic aorta. Left Port-A-Cath tip is positioned in the proximal SVC. Mediastinum/Nodes: No mediastinal lymphadenopathy. 1.9 cm short axis subcarinal node measured previously is 1.0 cm today (62/2). 1.3 cm short axis AP window node measured previously is 0.6 cm short axis today (48/2) no evidence for gross hilar lymphadenopathy although assessment is limited by the lack of intravenous contrast on today's study. The esophagus has normal imaging features. There is no axillary lymphadenopathy. Lungs/Pleura: Stable 4 mm posterior right upper lobe pulmonary nodule on 35/3. Surgical changes in the left hilum consistent with prior left lower lobectomy. No new suspicious pulmonary nodule or mass. No focal airspace consolidation. There is no evidence of pleural effusion. Upper Abdomen: Calcified granulomata noted in the spleen. Musculoskeletal: No worrisome lytic or sclerotic osseous abnormality. IMPRESSION: 1. Interval resolution of mediastinal lymphadenopathy seen previously. 2. No new or progressive findings to suggest new recurrent or metastatic disease. 3. Stable 4 mm posterior right upper lobe pulmonary nodule. Continued attention on follow-up recommended. 4. Aortic Atherosclerosis (ICD10-I70.0). Electronically Signed   By: EMisty StanleyM.D.   On: 06/19/2020 11:14    ASSESSMENT: Clinical stage IIIa squamous cell 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  PLAN:    1.  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  He had a complicated postoperative course. CT scan results from August 14, 2019 reviewed independently with a suspicious subcarinal lymph node measuring 1.4 cm.  Follow-up PET scan on August 23, 2019 revealed hypermetabolism in lymph  node highly suspicious for recurrence.  Biopsy confirmed recurrence increasing patient's stage from IA up to IIIA. Patient completed concurrent XRT and weekly carboplatinum and Taxol on January 05, 2020.  Patient initiated 1 year of maintenance immunotherapy using durvalumab on January 19, 2020.  Repeat CT scan from June 19, 2020 reviewed independently and report as above with resolution of mediastinal lymphadenopathy and no new or progressive findings.  Proceed with cycle 12 of treatment today.  Return to clinic in 2 weeks for treatment only and then in 4 weeks for further evaluation and consideration of cycle 14.   2.  Pain: Chronic and unchanged.  Unrelated to his malignancy.  Continue monitoring and treatment per pain clinic. 3.  Nausea: Patient does not complain of this today.  Previously, patient asked if it were okay to take CBD Gummies for nausea.  From an oncology standpoint this would be okay, but patient did state concern that it would show a positive urine test.  He was instructed to let pain clinic know that he was taking this for oncologic purposes. 4.  Hypokalemia: Mild, monitor.. 5.  Anemia: Essentially resolved.   Patient expressed understanding and was in agreement with this plan. He also understands that He can call clinic at any time with any questions, concerns, or complaints.   Cancer Staging Squamous cell carcinoma of left lung [facility] Staging form: Lung, AJCC 8th Edition - Clinical stage from 02/12/2019: Stage IIIA (cT1b, cN2, cM0) - Signed by [name], MD on 11/01/2019   [name], MD   06/21/2020 8:06 PM

## 2020-06-19 ENCOUNTER — Other Ambulatory Visit: Payer: Self-pay

## 2020-06-19 ENCOUNTER — Ambulatory Visit
Admission: RE | Admit: 2020-06-19 | Discharge: 2020-06-19 | Disposition: A | Discharge status: Home / Self Care | Payer: Medicare Other | Source: Ambulatory Visit | Attending: Oncology | Admitting: Oncology

## 2020-06-19 DIAGNOSIS — I700 Atherosclerosis of aorta: Secondary | ICD-10-CM | POA: Diagnosis not present

## 2020-06-19 DIAGNOSIS — C3490 Malignant neoplasm of unspecified part of unspecified bronchus or lung: Secondary | ICD-10-CM | POA: Diagnosis not present

## 2020-06-19 DIAGNOSIS — I2510 Atherosclerotic heart disease of native coronary artery without angina pectoris: Secondary | ICD-10-CM | POA: Diagnosis not present

## 2020-06-19 DIAGNOSIS — D71 Functional disorders of polymorphonuclear neutrophils: Secondary | ICD-10-CM | POA: Diagnosis not present

## 2020-06-19 DIAGNOSIS — C3492 Malignant neoplasm of unspecified part of left bronchus or lung: Secondary | ICD-10-CM | POA: Diagnosis not present

## 2020-06-19 MED ORDER — IOHEXOL 300 MG/ML  SOLN
75.0000 mL | Freq: Once | INTRAMUSCULAR | Status: AC | PRN
  Administered 2020-06-19: 75 mL via INTRAVENOUS

## 2020-06-21 ENCOUNTER — Inpatient Hospital Stay: Payer: Medicare Other

## 2020-06-21 ENCOUNTER — Telehealth: Payer: Medicare Other

## 2020-06-21 ENCOUNTER — Other Ambulatory Visit: Payer: Self-pay | Admitting: Licensed Clinical Social Worker

## 2020-06-21 ENCOUNTER — Telehealth: Payer: Self-pay | Admitting: General Practice

## 2020-06-21 ENCOUNTER — Encounter: Payer: Self-pay | Admitting: Radiation Oncology

## 2020-06-21 ENCOUNTER — Other Ambulatory Visit: Payer: Self-pay

## 2020-06-21 ENCOUNTER — Ambulatory Visit
Admission: RE | Admit: 2020-06-21 | Discharge: 2020-06-21 | Disposition: A | Discharge status: Home / Self Care | Payer: Medicare Other | Source: Ambulatory Visit | Attending: Radiation Oncology | Admitting: Radiation Oncology

## 2020-06-21 ENCOUNTER — Encounter: Payer: Self-pay | Admitting: Oncology

## 2020-06-21 ENCOUNTER — Inpatient Hospital Stay (HOSPITAL_BASED_OUTPATIENT_CLINIC_OR_DEPARTMENT_OTHER): Payer: Medicare Other | Admitting: Oncology

## 2020-06-21 VITALS — BP 128/72 | HR 98 | Temp 96.5°F | Resp 18 | Wt 213.1 lb

## 2020-06-21 VITALS — Temp 96.3°F | Wt 217.0 lb

## 2020-06-21 DIAGNOSIS — C771 Secondary and unspecified malignant neoplasm of intrathoracic lymph nodes: Secondary | ICD-10-CM | POA: Insufficient documentation

## 2020-06-21 DIAGNOSIS — I700 Atherosclerosis of aorta: Secondary | ICD-10-CM | POA: Diagnosis not present

## 2020-06-21 DIAGNOSIS — R911 Solitary pulmonary nodule: Secondary | ICD-10-CM | POA: Insufficient documentation

## 2020-06-21 DIAGNOSIS — Z886 Allergy status to analgesic agent status: Secondary | ICD-10-CM | POA: Diagnosis not present

## 2020-06-21 DIAGNOSIS — Z923 Personal history of irradiation: Secondary | ICD-10-CM | POA: Insufficient documentation

## 2020-06-21 DIAGNOSIS — Z833 Family history of diabetes mellitus: Secondary | ICD-10-CM | POA: Diagnosis not present

## 2020-06-21 DIAGNOSIS — Z08 Encounter for follow-up examination after completed treatment for malignant neoplasm: Secondary | ICD-10-CM | POA: Diagnosis not present

## 2020-06-21 DIAGNOSIS — Z8249 Family history of ischemic heart disease and other diseases of the circulatory system: Secondary | ICD-10-CM | POA: Diagnosis not present

## 2020-06-21 DIAGNOSIS — Z9221 Personal history of antineoplastic chemotherapy: Secondary | ICD-10-CM | POA: Insufficient documentation

## 2020-06-21 DIAGNOSIS — C3492 Malignant neoplasm of unspecified part of left bronchus or lung: Secondary | ICD-10-CM

## 2020-06-21 DIAGNOSIS — E876 Hypokalemia: Secondary | ICD-10-CM | POA: Diagnosis not present

## 2020-06-21 DIAGNOSIS — Z880 Allergy status to penicillin: Secondary | ICD-10-CM | POA: Diagnosis not present

## 2020-06-21 DIAGNOSIS — Z79899 Other long term (current) drug therapy: Secondary | ICD-10-CM | POA: Diagnosis not present

## 2020-06-21 DIAGNOSIS — Z5112 Encounter for antineoplastic immunotherapy: Secondary | ICD-10-CM | POA: Diagnosis not present

## 2020-06-21 DIAGNOSIS — C3432 Malignant neoplasm of lower lobe, left bronchus or lung: Secondary | ICD-10-CM | POA: Insufficient documentation

## 2020-06-21 DIAGNOSIS — Z902 Acquired absence of lung [part of]: Secondary | ICD-10-CM | POA: Diagnosis not present

## 2020-06-21 DIAGNOSIS — Z809 Family history of malignant neoplasm, unspecified: Secondary | ICD-10-CM | POA: Diagnosis not present

## 2020-06-21 DIAGNOSIS — E119 Type 2 diabetes mellitus without complications: Secondary | ICD-10-CM | POA: Diagnosis not present

## 2020-06-21 DIAGNOSIS — R110 Nausea: Secondary | ICD-10-CM | POA: Diagnosis not present

## 2020-06-21 DIAGNOSIS — Z9049 Acquired absence of other specified parts of digestive tract: Secondary | ICD-10-CM | POA: Diagnosis not present

## 2020-06-21 DIAGNOSIS — F17210 Nicotine dependence, cigarettes, uncomplicated: Secondary | ICD-10-CM | POA: Diagnosis not present

## 2020-06-21 DIAGNOSIS — G8929 Other chronic pain: Secondary | ICD-10-CM | POA: Diagnosis not present

## 2020-06-21 LAB — COMPREHENSIVE METABOLIC PANEL
ALT: 25 U/L (ref 0–44)
AST: 32 U/L (ref 15–41)
Albumin: 4.2 g/dL (ref 3.5–5.0)
Alkaline Phosphatase: 127 U/L — ABNORMAL HIGH (ref 38–126)
Anion gap: 11 (ref 5–15)
BUN: 8 mg/dL (ref 6–20)
CO2: 25 mmol/L (ref 22–32)
Calcium: 9 mg/dL (ref 8.9–10.3)
Chloride: 104 mmol/L (ref 98–111)
Creatinine, Ser: 0.72 mg/dL (ref 0.61–1.24)
GFR, Estimated: >60 mL/min (ref >60)
Glucose, Bld: 135 mg/dL — ABNORMAL HIGH (ref 70–99)
Potassium: 3.3 mmol/L — ABNORMAL LOW (ref 3.5–5.1)
Sodium: 140 mmol/L (ref 135–145)
Total Bilirubin: 0.6 mg/dL (ref 0.3–1.2)
Total Protein: 7.5 g/dL (ref 6.5–8.1)

## 2020-06-21 LAB — CBC WITH DIFFERENTIAL/PLATELET
Abs Immature Granulocytes: 0.01 10*3/uL (ref 0.00–0.07)
Basophils Absolute: 0.1 10*3/uL (ref 0.0–0.1)
Basophils Relative: 1 %
Eosinophils Absolute: 0.1 10*3/uL (ref 0.0–0.5)
Eosinophils Relative: 2 %
HCT: 38.5 % — ABNORMAL LOW (ref 39.0–52.0)
Hemoglobin: 12.8 g/dL — ABNORMAL LOW (ref 13.0–17.0)
Immature Granulocytes: 0 %
Lymphocytes Relative: 30 %
Lymphs Abs: 1.5 10*3/uL (ref 0.7–4.0)
MCH: 29.8 pg (ref 26.0–34.0)
MCHC: 33.2 g/dL (ref 30.0–36.0)
MCV: 89.7 fL (ref 80.0–100.0)
Monocytes Absolute: 0.5 10*3/uL (ref 0.1–1.0)
Monocytes Relative: 9 %
Neutro Abs: 2.9 10*3/uL (ref 1.7–7.7)
Neutrophils Relative %: 58 %
Platelets: 186 10*3/uL (ref 150–400)
RBC: 4.29 MIL/uL (ref 4.22–5.81)
RDW: 14.6 % (ref 11.5–15.5)
WBC: 5 10*3/uL (ref 4.0–10.5)
nRBC: 0 % (ref 0.0–0.2)

## 2020-06-21 LAB — TSH: TSH: 1.258 u[IU]/mL (ref 0.350–4.500)

## 2020-06-21 MED ORDER — SODIUM CHLORIDE 0.9 % IV SOLN
Freq: Once | INTRAVENOUS | Status: AC
  Filled 2020-06-21: qty 250

## 2020-06-21 MED ORDER — HEPARIN SOD (PORK) LOCK FLUSH 100 UNIT/ML IV SOLN
500.0000 [IU] | Freq: Once | INTRAVENOUS | Status: AC
  Administered 2020-06-21: 500 [IU] via INTRAVENOUS
  Filled 2020-06-21: qty 5

## 2020-06-21 MED ORDER — HEPARIN SOD (PORK) LOCK FLUSH 100 UNIT/ML IV SOLN
500.0000 [IU] | Freq: Once | INTRAVENOUS | Status: DC | PRN
  Filled 2020-06-21: qty 5

## 2020-06-21 MED ORDER — SODIUM CHLORIDE 0.9% FLUSH
10.0000 mL | INTRAVENOUS | Status: DC | PRN
  Administered 2020-06-21: 10 mL via INTRAVENOUS
  Filled 2020-06-21: qty 10

## 2020-06-21 MED ORDER — SODIUM CHLORIDE 0.9 % IV SOLN
10.0000 mg/kg | Freq: Once | INTRAVENOUS | Status: AC
  Administered 2020-06-21: 1000 mg via INTRAVENOUS
  Filled 2020-06-21: qty 20

## 2020-06-21 MED ORDER — SODIUM CHLORIDE 0.9% FLUSH
10.0000 mL | INTRAVENOUS | Status: DC | PRN
  Administered 2020-06-21: 10 mL
  Filled 2020-06-21: qty 10

## 2020-06-21 NOTE — Progress Notes (Signed)
Radiation Oncology Follow up Note  Name: Kristopher [name]   Date:   06/21/2020 MRN:  [id number] DOB: Jun 02, 1960    This 60 y.o. male presents to the clinic today for 8-month follow-up status post concurrent chemoradiation for stage IIIa squamous cell carcinoma the left lung status post left lower lobectomy.  REFERRING PROVIDER: [name], DO  HPI: Patient is a 60 year old male now out 5 months status post concurrent chemoradiation therapy for stage IIIa squamous cell carcinoma left lung status post left lower lobectomy.  Seen today in routine follow-up he is doing well specifically denies cough chest tightness or any dysphagia.  He is currently on maintenance durvalumab  which she is tolerating well.  Recent CT scan showed interval resolution of mediastinal lymphadenopathy no new or progressive findings to suggest recurrent disease or metastatic disease.  He has a 4 mm posterior right upper lobe pulmonary nodule which we will continue to follow.  COMPLICATIONS OF TREATMENT: none  FOLLOW UP COMPLIANCE: keeps appointments   PHYSICAL EXAM:  Temp (!) 96.3 F (35.7 C) (Tympanic)   Wt 217 lb (98.4 kg)   BMI 31.14 kg/m  Well-developed well-nourished patient in NAD. HEENT reveals PERLA, EOMI, discs not visualized.  Oral cavity is clear. No oral mucosal lesions are identified. Neck is clear without evidence of cervical or supraclavicular adenopathy. Lungs are clear to A&P. Cardiac examination is essentially unremarkable with regular rate and rhythm without murmur rub or thrill. Abdomen is benign with no organomegaly or masses noted. Motor sensory and DTR levels are equal and symmetric in the upper and lower extremities. Cranial nerves II through XII are grossly intact. Proprioception is intact. No peripheral adenopathy or edema is identified. No motor or sensory levels are noted. Crude visual fields are within normal range.  RADIOLOGY RESULTS: CT scan reviewed compatible with above-stated  findings  PLAN: Present time patient is doing well no evidence of progressive disease currently on maintenance immunotherapy under medical oncology's direction.  I have asked to see him back in 6 months for follow-up we will have a CT scan prior to that visit.  Patient knows to call with any concerns.  He continues close follow-up and treatment by medical oncology.  I would like to take this opportunity to thank you for allowing me to participate in the care of your patient.Noreene Filbert, MD

## 2020-06-21 NOTE — Progress Notes (Signed)
chest

## 2020-06-21 NOTE — Telephone Encounter (Signed)
  Chronic Care Management   Outreach Note  06/21/2020 Name: Kristopher [name] MRN: [id number] DOB: [birth year]  Referred by: [name], DO Reason for referral : Appointment (RNCM: Follow up for Chronic Disease Management and Care Coordination Need )   An unsuccessful telephone outreach was attempted today. The patient was referred to the case management team for assistance with care management and care coordination. The patients wife answered the phone and states the patient is asleep. Will reschedule.   Follow Up Plan: The care management team will reach out to the patient again over the next 30 days.   Noreene Larsson RN, MSN, [facility] Mobile: [phone]

## 2020-06-22 LAB — T4: T4, Total: 6.5 ug/dL (ref 4.5–12.0)

## 2020-07-05 ENCOUNTER — Inpatient Hospital Stay: Payer: Medicare Other | Attending: Oncology

## 2020-07-05 VITALS — BP 125/84 | HR 63 | Temp 97.3°F | Resp 20 | Wt 215.0 lb

## 2020-07-05 DIAGNOSIS — G8929 Other chronic pain: Secondary | ICD-10-CM | POA: Diagnosis not present

## 2020-07-05 DIAGNOSIS — C3432 Malignant neoplasm of lower lobe, left bronchus or lung: Secondary | ICD-10-CM | POA: Insufficient documentation

## 2020-07-05 DIAGNOSIS — Z5112 Encounter for antineoplastic immunotherapy: Secondary | ICD-10-CM | POA: Diagnosis not present

## 2020-07-05 DIAGNOSIS — Z79899 Other long term (current) drug therapy: Secondary | ICD-10-CM | POA: Diagnosis not present

## 2020-07-05 DIAGNOSIS — Z9049 Acquired absence of other specified parts of digestive tract: Secondary | ICD-10-CM | POA: Diagnosis not present

## 2020-07-05 DIAGNOSIS — Z880 Allergy status to penicillin: Secondary | ICD-10-CM | POA: Diagnosis not present

## 2020-07-05 DIAGNOSIS — E876 Hypokalemia: Secondary | ICD-10-CM | POA: Insufficient documentation

## 2020-07-05 DIAGNOSIS — Z886 Allergy status to analgesic agent status: Secondary | ICD-10-CM | POA: Diagnosis not present

## 2020-07-05 DIAGNOSIS — Z809 Family history of malignant neoplasm, unspecified: Secondary | ICD-10-CM | POA: Diagnosis not present

## 2020-07-05 DIAGNOSIS — R110 Nausea: Secondary | ICD-10-CM | POA: Diagnosis not present

## 2020-07-05 DIAGNOSIS — Z8249 Family history of ischemic heart disease and other diseases of the circulatory system: Secondary | ICD-10-CM | POA: Diagnosis not present

## 2020-07-05 DIAGNOSIS — F17210 Nicotine dependence, cigarettes, uncomplicated: Secondary | ICD-10-CM | POA: Diagnosis not present

## 2020-07-05 DIAGNOSIS — E119 Type 2 diabetes mellitus without complications: Secondary | ICD-10-CM | POA: Insufficient documentation

## 2020-07-05 DIAGNOSIS — Z833 Family history of diabetes mellitus: Secondary | ICD-10-CM | POA: Insufficient documentation

## 2020-07-05 DIAGNOSIS — C3492 Malignant neoplasm of unspecified part of left bronchus or lung: Secondary | ICD-10-CM

## 2020-07-05 MED ORDER — HEPARIN SOD (PORK) LOCK FLUSH 100 UNIT/ML IV SOLN
500.0000 [IU] | Freq: Once | INTRAVENOUS | Status: AC | PRN
Start: 2020-07-05 — End: 2020-07-05
  Administered 2020-07-05: 500 [IU]
  Filled 2020-07-05: qty 5

## 2020-07-05 MED ORDER — SODIUM CHLORIDE 0.9 % IV SOLN
Freq: Once | INTRAVENOUS | Status: AC
  Filled 2020-07-05: qty 250

## 2020-07-05 MED ORDER — SODIUM CHLORIDE 0.9 % IV SOLN
10.0000 mg/kg | Freq: Once | INTRAVENOUS | Status: AC
  Administered 2020-07-05: 1000 mg via INTRAVENOUS
  Filled 2020-07-05: qty 20

## 2020-07-05 NOTE — Patient Instructions (Signed)
[facility] ONCOLOGY  Discharge Instructions: Thank you for choosing [facility] to provide your oncology and hematology care.  If you have a lab appointment with the [facility], please go directly to the [facility] and check in at the registration area.  Wear comfortable clothing and clothing appropriate for easy access to any Portacath or PICC line.   We strive to give you quality time with your provider. You may need to reschedule your appointment if you arrive late (15 or more minutes).  Arriving late affects you and other patients whose appointments are after yours.  Also, if you miss three or more appointments without notifying the office, you may be dismissed from the clinic at the provider's discretion.      For prescription refill requests, have your pharmacy contact our office and allow 72 hours for refills to be completed.    Today you received the following chemotherapy and/or immunotherapy agents Durvalumab       To help prevent nausea and vomiting after your treatment, we encourage you to take your nausea medication as directed.  BELOW ARE SYMPTOMS THAT SHOULD BE REPORTED IMMEDIATELY: . *FEVER GREATER THAN 100.4 F (38 C) OR HIGHER . *CHILLS OR SWEATING . *NAUSEA AND VOMITING THAT IS NOT CONTROLLED WITH YOUR NAUSEA MEDICATION . *UNUSUAL SHORTNESS OF BREATH . *UNUSUAL BRUISING OR BLEEDING . *URINARY PROBLEMS (pain or burning when urinating, or frequent urination) . *BOWEL PROBLEMS (unusual diarrhea, constipation, pain near the anus) . TENDERNESS IN MOUTH AND THROAT WITH OR WITHOUT PRESENCE OF ULCERS (sore throat, sores in mouth, or a toothache) . UNUSUAL RASH, SWELLING OR PAIN  . UNUSUAL VAGINAL DISCHARGE OR ITCHING   Items with * indicate a potential emergency and should be followed up as soon as possible or go to the Emergency Department if any problems should occur.  Please show the CHEMOTHERAPY ALERT CARD or IMMUNOTHERAPY  ALERT CARD at check-in to the Emergency Department and triage nurse.  Should you have questions after your visit or need to cancel or reschedule your appointment, please contact [facility]  [phone] and follow the prompts.  Office hours are 8:00 a.m. to 4:30 p.m. Monday - Friday. Please note that voicemails left after 4:00 p.m. may not be returned until the following business day.  We are closed weekends and major holidays. You have access to a nurse at all times for urgent questions. Please call the main number to the clinic [phone] and follow the prompts.  For any non-urgent questions, you may also contact your provider using MyChart. We now offer e-Visits for anyone 46 and older to request care online for non-urgent symptoms. For details visit [email or web address].   Also download the MyChart app! Go to the app store, search "MyChart", open the app, select [facility], and log in with your MyChart username and password.  Due to Covid, a mask is required upon entering the hospital/clinic. If you do not have a mask, one will be given to you upon arrival. For doctor visits, patients may have 1 support person aged 45 or older with them. For treatment visits, patients cannot have anyone with them due to current Covid guidelines and our immunocompromised population.

## 2020-07-12 ENCOUNTER — Ambulatory Visit (INDEPENDENT_AMBULATORY_CARE_PROVIDER_SITE_OTHER): Payer: Medicare Other | Admitting: Pharmacist

## 2020-07-12 DIAGNOSIS — I10 Essential (primary) hypertension: Secondary | ICD-10-CM | POA: Diagnosis not present

## 2020-07-12 DIAGNOSIS — C3490 Malignant neoplasm of unspecified part of unspecified bronchus or lung: Secondary | ICD-10-CM | POA: Diagnosis not present

## 2020-07-12 DIAGNOSIS — E785 Hyperlipidemia, unspecified: Secondary | ICD-10-CM

## 2020-07-12 DIAGNOSIS — E1122 Type 2 diabetes mellitus with diabetic chronic kidney disease: Secondary | ICD-10-CM

## 2020-07-12 DIAGNOSIS — C3492 Malignant neoplasm of unspecified part of left bronchus or lung: Secondary | ICD-10-CM | POA: Diagnosis not present

## 2020-07-12 DIAGNOSIS — E1169 Type 2 diabetes mellitus with other specified complication: Secondary | ICD-10-CM | POA: Diagnosis not present

## 2020-07-12 DIAGNOSIS — N181 Chronic kidney disease, stage 1: Secondary | ICD-10-CM | POA: Diagnosis not present

## 2020-07-12 NOTE — Progress Notes (Signed)
Chronic Care Management Pharmacy Note  08/01/2020 Name:  Kristopher [name] MRN:  [id number] DOB:  17-Nov-1960  Subjective: Kristopher [name] is an 60 y.o. year old male who is a primary patient of [name], DO.  The CCM team was consulted for assistance with disease management and care coordination needs.    Engaged with patient by telephone for follow up visit in response to provider referral for pharmacy case management and/or care coordination services.   Consent to Services:  The patient was given information about Chronic Care Management services, agreed to services, and gave verbal consent prior to initiation of services.  Please see initial visit note for detailed documentation.   Patient Care Team: [name], DO as PCP - General (Family Medicine) Gavin Pound, [place] (Inactive) as Certified Medical Assistant [place], Angie Fava, RN as Registered Nurse Vanita Ingles, RN as Case Manager (General Practice) [name], MD as Referring Physician (Pain Medicine) [name], MD as Consulting Physician (Oncology)  Recent office visits: 05/21/20-[facility] (PCP)- a1c  Recent consult visits: 07/05/20- [facility] gets durvalumab q 14 days [facility]/09/22[facility] visits: None in previous 6 months  Objective:  Lab Results  Component Value Date   CREATININE 0.76 07/19/2020   BUN 8 07/19/2020   GFRNONAA >60 07/19/2020   GFRAA >60 12/01/2019   NA 137 07/19/2020   K 3.3 (L) 07/19/2020   CALCIUM 9.0 07/19/2020   CO2 27 07/19/2020   GLUCOSE 175 (H) 07/19/2020    Lab Results  Component Value Date/Time   HGBA1C 6.7 05/29/2020 08:39 AM   HGBA1C 5.2 02/28/2020 03:26 PM   MICROALBUR 150 (H) 04/27/2019 08:07 AM   MICROALBUR 80 (H) 04/08/2018 11:03 AM    Last diabetic Eye exam:  Lab Results  Component Value Date/Time   HMDIABEYEEXA No Retinopathy 02/17/2018 12:00 AM    Last diabetic Foot exam: No results found for: HMDIABFOOTEX   Lab Results   Component Value Date   CHOL 138 11/26/2019   HDL 28 (L) 11/26/2019   LDLCALC 67 11/26/2019   TRIG 263 (H) 11/26/2019   CHOLHDL 7.0 08/14/2017    Hepatic Function Latest Ref Rng & Units 07/19/2020 06/21/2020 05/24/2020  Total Protein 6.5 - 8.1 g/dL 7.9 7.5 7.5  Albumin 3.5 - 5.0 g/dL 4.3 4.2 4.1  AST 15 - 41 U/L 36 32 23  ALT 0 - 44 U/L 39 25 21  Alk Phosphatase 38 - 126 U/L 112 127(H) 127(H)  Total Bilirubin 0.3 - 1.2 mg/dL 0.7 0.6 0.6  Bilirubin, Direct 0.0 - 0.2 mg/dL - - -    Lab Results  Component Value Date/Time   TSH 1.062 07/19/2020 09:28 AM   TSH 1.258 06/21/2020 09:25 AM   TSH 1.350 10/08/2017 10:31 AM   TSH 0.904 06/24/2016 09:16 AM    CBC Latest Ref Rng & Units 07/19/2020 06/21/2020 05/24/2020  WBC 4.0 - 10.5 K/uL 5.2 5.0 5.2  Hemoglobin 13.0 - 17.0 g/dL 13.3 12.8(L) 12.2(L)  Hematocrit 39.0 - 52.0 % 40.4 38.5(L) 36.8(L)  Platelets 150 - 400 K/uL 163 186 208    No results found for: VD25OH  Clinical ASCVD: Yes  The 10-year ASCVD risk score Mikey Bussing DC Jr., et al., 2013) is: 25.3%   Values used to calculate the score:     Age: 71 years     Sex: Male     Is Non-Hispanic African American: No     Diabetic: Yes     Tobacco  smoker: Yes     Systolic Blood Pressure: 800 mmHg     Is BP treated: Yes     HDL Cholesterol: 28 mg/dL     Total Cholesterol: 138 mg/dL    Depression screen [place] [facility] 2/9 05/22/2020 02/23/2020 10/13/2019  Decreased Interest 0 0 0  Down, Depressed, Hopeless 0 0 0  PHQ - 2 Score 0 0 0  Some recent data might be hidden      Social History   Tobacco Use  Smoking Status Current Every Day Smoker  . Packs/day: 0.25  . Years: 35.00  . Pack years: 8.75  . Types: Cigarettes  . Last attempt to quit: 01/14/2019  . Years since quitting: 1.5  Smokeless Tobacco Never Used   BP Readings from Last 3 Encounters:  07/19/20 121/80  07/19/20 127/89  07/05/20 125/84   Pulse Readings from Last 3 Encounters:  07/19/20 60  07/19/20 68  07/05/20 63   Wt  Readings from Last 3 Encounters:  07/19/20 218 lb 6.4 oz (99.1 kg)  07/05/20 215 lb (97.5 kg)  06/21/20 217 lb (98.4 kg)   BMI Readings from Last 3 Encounters:  07/19/20 31.34 kg/m  07/05/20 30.85 kg/m  06/21/20 31.14 kg/m    Assessment/Interventions: Review of patient past medical history, allergies, medications, health status, including review of consultants reports, laboratory and other test data, was performed as part of comprehensive evaluation and provision of chronic care management services.   SDOH:  (Social Determinants of Health) assessments and interventions performed: No  SDOH Screenings   Alcohol Screen: Not on file  Depression (PHQ2-9): Low Risk   . PHQ-2 Score: 0  Financial Resource Strain: Low Risk   . Difficulty of Paying Living Expenses: Not hard at all  Food Insecurity: Not on file  Housing: Not on file  Physical Activity: Not on file  Social Connections: Not on file  Stress: Not on file  Tobacco Use: High Risk  . Smoking Tobacco Use: Current Every Day Smoker  . Smokeless Tobacco Use: Never Used  Transportation Needs: Not on file    Immunization History  Administered Date(s) Administered  . Pneumococcal Polysaccharide-23 06/24/2016  . Tdap 03/04/2013, 06/03/2017    Conditions to be addressed/monitored:  Hypertension, Hyperlipidemia, Coronary Artery Disease and COPD, lung cancer  Care Plan : [place]  Updates made by Vladimir Faster, RPH since 08/01/2020 12:00 AM    Problem: DM2, HLD, HTN, COPD, tobacco abuse, chronic pain,   Priority: High    Long-Range Goal: Disease Management   Start Date: 07/19/2020  This Visit's Progress: On track  Priority: High  Note:   Current Barriers:  . Unable to independently monitor therapeutic efficacy . Unable to self administer medications as prescribed . Does not adhere to prescribed medication regimen . Does not contact provider office for questions/concerns  Pharmacist Clinical Goal(s):   [name] Patient will achieve adherence to monitoring guidelines and medication adherence to achieve therapeutic efficacy . maintain control of diabetes, COPD as evidenced by lab values, symptom report  . achieve ability to self administer medications as prescribed through use of pill box as evidenced by patient report . adhere to prescribed medication regimen as evidenced by fill dates . contact provider office for questions/concerns as evidenced notation of same in electronic health record through collaboration with PharmD and provider.   Interventions: . 1:1 collaboration with [name], DO regarding development and update of comprehensive plan of care as evidenced by provider attestation and co-signature . Inter-disciplinary  care team collaboration (see longitudinal plan of care) . Comprehensive medication review performed; medication list updated in electronic medical record BP Readings from Last 3 Encounters:  07/19/20 121/80  07/19/20 127/89  07/05/20 125/84    Hypertension (BP goal <130/80) -Controlled -Current treatment: . Amlodipine 5 mg qd . Hydralazine 100 mg bid . Metoprolol tartrate 50 mg bid -Medications previously tried: NA -Current home readings: 120s/80-90s --Denies hypotensive/hypertensive symptoms -Educated on BP goals and benefits of medications for prevention of heart attack, stroke and kidney damage; Daily salt intake goal < 2300 mg; Exercise goal of 150 minutes per week; Symptoms of hypotension and importance of maintaining adequate hydration; -Counseled to monitor BP at home 3 times weekly, document, and provide log at future appointments -Recommended to continue current medication Lab Results  Component Value Date   CHOL 138 11/26/2019   HDL 28 (L) 11/26/2019   LDLCALC 67 11/26/2019   TRIG 263 (H) 11/26/2019   CHOLHDL 7.0 08/14/2017    Hyperlipidemia: (LDL goal < 70) -Controlled -Current treatment: . Atorvastatin 10 mg qd -Medications previously  tried: na  --Educated on Benefits of statin for ASCVD risk reduction; Exercise goal of 150 minutes per week; -Recommended patient continue. Collaborated with PCP to send new RX to [facility]. Patient does not use [facility]. Assessed fill history.Has not filled this year. [facility] March.  Lab Results  Component Value Date   HGBA1C 6.7 05/29/2020   Diabetes (A1c goal <7%) -Controlled -Current medications: . Jardiance 25 mg qam . Metformin 500 mg prn for elevated BG -Medications previously tried: NA  -Current home glucose readings . fasting glucose: 120s mostly, a couple readings in 130s . post prandial glucose: Not checking -Denies hypoglycemic/hyperglycemic symptoms --Current exercise: jogging -Educated on A1c and blood sugar goals; Exercise goal of 150 minutes per week; Prevention and management of hypoglycemic episodes; Sick day management: glucometer readings QID until well and stable, maintain hydration with high fluid intake and call MD if glucose above 400 -Counseled to check feet daily and get yearly eye exams -Recommended to continue current medication  Tobacco use (Goal cessation) -Uncontrolled -Previous quit attempts: quit cold [place] -Current treatment  . none -Patient smokes After 30 minutes of waking -Patient triggers include: stress and anxiety -On a scale of 1-10, reports MOTIVATION to quit is 4 -On a scale of 1-10, reports CONFIDENCE in quitting is 8 -Provided contact information for [place] [facility] (1-800-QUIT-NOW) and encouraged patient to reach out to this group for support. -Patient wants to try to quit "cold [place]" again before using medication. Reviewed highest success rates with NRT and medication combo.   Patient Goals/Self-Care Activities . Patient will:  - take medications as prescribed focus on medication adherence by taking medications as prescribed. Keeping a list for himself. Wife currently handles all meds. check glucose daily, document, and  provide at future appointments check blood pressure 3 times weekly, document, and provide at future appointments collaborate with provider on medication access solutions target a minimum of 150 minutes of moderate intensity exercise weekly  Follow Up Plan: Telephone follow up appointment with care management team member scheduled for:: 2 months          Medication Assistance: None required.  Patient affirms current coverage meets needs.  Patient's preferred pharmacy is:  [id number] [name], [place] [address] Phone: [phone] Fax: [phone]  [id number]  [place], [place] [address] [place] [address] Phone: [phone] Fax: [phone]  Uses pill box? Yes Wife fills weekly and manages all meds Pt endorses 95 % compliance  We discussed: Benefits of medication synchronization, packaging and delivery as well as enhanced pharmacist oversight with [employer]. Patient decided to: Continue current medication management strategy  Care Plan and Follow Up Patient Decision:  Patient agrees to Care Plan and Follow-up.  Plan: Telephone follow up appointment with care management team member scheduled for:  2 months  Junita Push. Kenton Kingfisher PharmD, [place] [facility] [phone]

## 2020-07-15 NOTE — Progress Notes (Signed)
[place]  [phone] [phone]  ID: Kristopher [name] OB: December 17, 1960  MR#: [id number]  [facility]#:[id number]  Patient Care Team: [name], DO as PCP - General (Family Medicine) Gavin Pound, CMA (Inactive) as Certified Medical Assistant Telford Nab, RN as Registered Nurse Vanita Ingles, RN as Case Manager (General Practice) [name], MD as Referring Physician (Pain Medicine) [name], MD as Consulting Physician (Oncology)  CHIEF COMPLAINT: Clinical stage IIIa squamous cell 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  INTERVAL HISTORY: Patient returns to clinic today for further evaluation and consideration of cycle 14 of maintenance durvalumab.  He continues to tolerate his treatments well without significant side effects.  He currently feels well and is asymptomatic.  He has a good appetite and denies weight loss.  He has no neurologic complaints.  He denies any recent fevers or illnesses.  He has no chest pain, shortness of breath, cough, or hemoptysis.  He denies any nausea, vomiting, constipation, or diarrhea.  He has no urinary complaints.  Patient offers no specific complaints today.  REVIEW OF SYSTEMS:   Review of Systems  Constitutional: Negative.  Negative for fever, malaise/fatigue and weight loss.  Respiratory: Negative.  Negative for cough and shortness of breath.   Cardiovascular: Negative.  Negative for chest pain and leg swelling.  Gastrointestinal: Negative.  Negative for abdominal pain and heartburn.  Genitourinary: Negative.  Negative for dysuria and flank pain.  Musculoskeletal: Negative.  Negative for back pain.  Skin: Negative.  Negative for rash.  Neurological: Negative.  Negative for dizziness, focal weakness, weakness and headaches.  Psychiatric/Behavioral: Negative.  The patient is not nervous/anxious.     As per HPI. Otherwise, a complete review of systems is negative.  PAST MEDICAL  HISTORY: Past Medical History:  Diagnosis Date  . Allergy   . Arthritis    left foot  . Benign hypertensive kidney disease   . Chronic back pain    Four rods in back  . Diabetes mellitus, type 2 ([place])   . Dyspnea   . GERD (gastroesophageal reflux disease)   . Hypertension   . Malignant neoplasm of lung ([place])   . Migraines    daily    PAST SURGICAL HISTORY: Past Surgical History:  Procedure Laterality Date  . APPENDECTOMY    . BACK SURGERY    . COLONOSCOPY WITH PROPOFOL N/A 02/19/2016   Procedure: COLONOSCOPY WITH PROPOFOL;  Surgeon: Lucilla Lame, MD;  Location: [facility];  Service: Endoscopy;  Laterality: N/A;  . COLONOSCOPY WITH PROPOFOL N/A 01/19/2018   Procedure: COLONOSCOPY WITH PROPOFOL;  Surgeon: Lucilla Lame, MD;  Location: [facility];  Service: Endoscopy;  Laterality: N/A;  Diabetic - oral meds  . DG OPERATIVE LEFT HIP ([place] [facility])     10/19  . ELECTROMAGNETIC NAVIGATION BROCHOSCOPY Left 11/18/2018   Procedure: ELECTROMAGNETIC NAVIGATION BRONCHOSCOPY;  Surgeon: Tyler Pita, MD;  Location: [facility];  Service: Cardiopulmonary;  Laterality: Left;  . FLEXIBLE BRONCHOSCOPY Bilateral 01/20/2019   Procedure: FLEXIBLE BRONCHOSCOPY;  Surgeon: Ottie Glazier, MD;  Location: [facility];  Service: Thoracic;  Laterality: Bilateral;  . FLEXIBLE BRONCHOSCOPY Bilateral 01/22/2019   Procedure: FLEXIBLE BRONCHOSCOPY;  Surgeon: Ottie Glazier, MD;  Location: [facility];  Service: Thoracic;  Laterality: Bilateral;  . FOOT SURGERY Left    Screws and plates  . JOINT REPLACEMENT Left 12/2017   DR Rudene Christians Hip  . KNEE SURGERY Left    X 2  . LEG SURGERY    .  LUNG CANCER SURGERY    . POLYPECTOMY N/A 02/19/2016   Procedure: POLYPECTOMY;  Surgeon: Lucilla Lame, MD;  Location: [facility];  Service: Endoscopy;  Laterality: N/A;  . POLYPECTOMY  01/19/2018   Procedure: POLYPECTOMY;  Surgeon: Lucilla Lame, MD;  Location: [facility];  Service: Endoscopy;;  .  PORTACATH PLACEMENT N/A 11/11/2019   Procedure: INSERTION PORT-A-CATH;  Surgeon: Nestor Lewandowsky, MD;  Location: [facility];  Service: General;  Laterality: N/A;  . THORACOTOMY Left 01/14/2019   Procedure: THORACOTOMY MAJOR, LEFT;  Surgeon: Nestor Lewandowsky, MD;  Location: [facility];  Service: General;  Laterality: Left;  . TOTAL HIP ARTHROPLASTY Left 12/02/2017   Procedure: TOTAL HIP ARTHROPLASTY ANTERIOR APPROACH;  Surgeon: Hessie Knows, MD;  Location: [facility];  Service: Orthopedics;  Laterality: Left;  [name] VIDEO BRONCHOSCOPY Left 01/14/2019   Procedure: VIDEO BRONCHOSCOPY WITH FLUORO, LEFT;  Surgeon: Nestor Lewandowsky, MD;  Location: [facility];  Service: General;  Laterality: Left;  [name] VIDEO BRONCHOSCOPY WITH ENDOBRONCHIAL NAVIGATION N/A 10/15/2019   Procedure: VIDEO BRONCHOSCOPY WITH ENDOBRONCHIAL NAVIGATION;  Surgeon: Ottie Glazier, MD;  Location: [facility];  Service: Thoracic;  Laterality: N/A;  . VIDEO BRONCHOSCOPY WITH ENDOBRONCHIAL ULTRASOUND N/A 10/15/2019   Procedure: VIDEO BRONCHOSCOPY WITH ENDOBRONCHIAL ULTRASOUND;  Surgeon: Ottie Glazier, MD;  Location: [facility];  Service: Thoracic;  Laterality: N/A;    FAMILY HISTORY: Family History  Problem Relation Age of Onset  . Cancer Father   . Diabetes Sister   . Thrombosis Sister     ADVANCED DIRECTIVES (Y/N):  N  HEALTH MAINTENANCE: Social History   Tobacco Use  . Smoking status: Current Every Day Smoker    Packs/day: 0.25    Years: 35.00    Pack years: 8.75    Types: Cigarettes    Last attempt to quit: 01/14/2019    Years since quitting: 1.5  . Smokeless tobacco: Never Used  Vaping Use  . Vaping Use: Never used  Substance Use Topics  . Alcohol use: No    Alcohol/week: 0.0 standard drinks  . Drug use: Yes    Types: Oxycodone    Comment: prescribed     Colonoscopy:  PAP:  Bone density:  Lipid panel:  Allergies  Allergen Reactions  . Acetaminophen Swelling  . Aspirin Anaphylaxis  . Epinephrine Anaphylaxis    Does not  include albuterol  . Novocain [Procaine] Anaphylaxis  . Penicillins Anaphylaxis    Has patient had a PCN reaction causing immediate rash, facial/tongue/throat swelling, SOB or lightheadedness with hypotension: Yes Has patient had a PCN reaction causing severe rash involving mucus membranes or skin necrosis: No Has patient had a PCN reaction that required hospitalization: Yes Has patient had a PCN reaction occurring within the last 10 years: No If all of the above answers are "NO", then may proceed with Cephalosporin use.   . Strawberry Extract Anaphylaxis  . Shellfish Allergy Hives and Nausea And Vomiting    Current Outpatient Medications  Medication Sig Dispense Refill  . Accu-Chek FastClix Lancets MISC USE TO TEST BLOOD SUGAR 2X A DAY 100 each 12  . albuterol (VENTOLIN HFA) 108 (90 Base) MCG/ACT inhaler Inhale into the lungs.    [name] amLODipine (NORVASC) 5 MG tablet Take 1 tablet (5 mg total) by mouth 2 (two) times daily. 180 tablet 1  . ARNUITY ELLIPTA 100 MCG/ACT AEPB Inhale 1 puff into the lungs daily.     [name] atorvastatin (LIPITOR) 10 MG tablet TAKE 1 TABLET (10 MG TOTAL) BY MOUTH DAILY AT 6 PM.  90 tablet 1  . BREZTRI AEROSPHERE 160-9-4.8 MCG/ACT AERO Inhale 2 puffs into the lungs 2 (two) times daily. Maintenance per pulmonology    . diphenhydrAMINE (BENADRYL) 25 MG tablet Take 50 mg by mouth daily.    . empagliflozin (JARDIANCE) 25 MG TABS tablet Take 1 tablet (25 mg total) by mouth daily before breakfast. 90 tablet 1  . esomeprazole (NEXIUM) 40 MG capsule Take 1 capsule (40 mg total) by mouth daily. 90 capsule 3  . glucose blood (ACCU-CHEK GUIDE) test strip USE TO TEST BLOOD SUGAR 2X A DAY 100 strip 3  . hydrALAZINE (APRESOLINE) 100 MG tablet Take 1 tablet (100 mg total) by mouth 2 (two) times daily. 180 tablet 1  . lidocaine (LIDODERM) 5 % Place 1 patch onto the skin daily. Remove & Discard patch within 12 hours or as directed by MD (Patient taking differently: Place 1 patch onto the  skin daily as needed (pain). Remove & Discard patch within 12 hours or as directed by MD) 30 patch 12  . lidocaine-prilocaine (EMLA) cream Apply 1 application topically as needed. Apply small amount to port site at least 1 hour prior to it being accessed, cover with plastic wrap 30 g 1  . metFORMIN (GLUCOPHAGE) 500 MG tablet Take 2 tablets (1,000 mg total) by mouth 2 (two) times daily with a meal. 360 tablet 1  . metoprolol tartrate (LOPRESSOR) 50 MG tablet Take 1 tablet (50 mg total) by mouth 2 (two) times daily. 180 tablet 1  . montelukast (SINGULAIR) 10 MG tablet Take 10 mg by mouth daily.    Derrill Memo ON 07/23/2020] morphine (MSIR) 15 MG tablet Take 1 tablet (15 mg total) by mouth every 6 (six) hours as needed for moderate pain or severe pain. Must last 30 days. 120 tablet 0  . Multiple Vitamin (MULTIVITAMIN WITH MINERALS) TABS tablet Take 1 tablet by mouth daily.    . NONFORMULARY OR COMPOUNDED ITEM Apply 1-2 mLs topically 4 (four) times daily as needed. 10% Ketamine/2% Cyclobenzaprine/6% Gabapentin Cream 240 each PRN  . morphine (MSIR) 15 MG tablet Take 1 tablet (15 mg total) by mouth every 6 (six) hours as needed for moderate pain or severe pain. Must last 30 days. 120 tablet 0  . nortriptyline (PAMELOR) 25 MG capsule Take 2 capsules (50 mg total) by mouth at bedtime. 180 capsule 1  . pantoprazole (PROTONIX) 40 MG tablet Take 1 tablet (40 mg total) by mouth daily. 15 tablet 2  . Potassium Chloride ER 20 MEQ TBCR TAKE 1 TABLET BY MOUTH EVERY DAY 90 tablet 1  . STIOLTO RESPIMAT 2.5-2.5 MCG/ACT AERS Inhale 2 puffs into the lungs daily. Prn per pulmonology notes    . SUMAtriptan (IMITREX) 50 MG tablet Take 1 tablet (50 mg total) by mouth every 2 (two) hours as needed for migraine. Take 1 tab at onset of migraine. May repeat in 2 hours if headache persists or recurs. 10 tablet 12   No current facility-administered medications for this visit.   Facility-Administered Medications Ordered in Other  Visits  Medication Dose Route Frequency Provider Last Rate Last Admin  . 0.9 %  sodium chloride infusion    Continuous PRN Dionne Bucy, CRNA   New Bag at 01/22/19 9563  . heparin lock flush 100 unit/mL  500 Units Intravenous Once [name], MD      . sodium chloride flush (NS) 0.9 % injection 10 mL  10 mL Intracatheter PRN [name], MD   10 mL at  03/01/20 0953  . sodium chloride flush (NS) 0.9 % injection 10 mL  10 mL Intravenous PRN [name], MD        OBJECTIVE: Vitals:   07/19/20 0942 07/19/20 0944  BP:  127/89  Pulse:  68  Resp: 20   Temp: 98.3 F (36.8 C)      Body mass index is 31.34 kg/m.    ECOG FS:0 - Asymptomatic  General: Well-developed, well-nourished, no acute distress. Eyes: Pink conjunctiva, anicteric sclera. HEENT: Normocephalic, moist mucous membranes. Lungs: No audible wheezing or coughing. Heart: Regular rate and rhythm. Abdomen: Soft, nontender, no obvious distention. Musculoskeletal: No edema, cyanosis, or clubbing. Neuro: Alert, answering all questions appropriately. Cranial nerves grossly intact. Skin: No rashes or petechiae noted. Psych: Normal affect.  LAB RESULTS:  Lab Results  Component Value Date   NA 137 07/19/2020   K 3.3 (L) 07/19/2020   CL 103 07/19/2020   CO2 27 07/19/2020   GLUCOSE 175 (H) 07/19/2020   BUN 8 07/19/2020   CREATININE 0.76 07/19/2020   CALCIUM 9.0 07/19/2020   PROT 7.9 07/19/2020   ALBUMIN 4.3 07/19/2020   AST 36 07/19/2020   ALT 39 07/19/2020   ALKPHOS 112 07/19/2020   BILITOT 0.7 07/19/2020   GFRNONAA >60 07/19/2020   GFRAA >60 12/01/2019    Lab Results  Component Value Date   WBC 5.2 07/19/2020   NEUTROABS 3.0 07/19/2020   HGB 13.3 07/19/2020   HCT 40.4 07/19/2020   MCV 91.4 07/19/2020   PLT 163 07/19/2020     STUDIES: No results found.  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  PLAN:    1.  Clinical stage IIIa  squamous cell carcinoma of the left lower lobe lung: Patient underwent lung resection on January 14, 2019.  He had a complicated postoperative course. CT scan results from August 14, 2019 reviewed independently with a suspicious subcarinal lymph node measuring 1.4 cm.  Follow-up PET scan on August 23, 2019 revealed hypermetabolism in lymph node highly suspicious for recurrence.  Biopsy confirmed recurrence increasing patient's stage from IA up to IIIA. Patient completed concurrent XRT and weekly carboplatinum and Taxol on January 05, 2020.  Patient initiated 1 year of maintenance immunotherapy using durvalumab on January 19, 2020.  Repeat CT scan from June 19, 2020 reviewed independently and report as above with resolution of mediastinal lymphadenopathy and no new or progressive findings.  Proceed with cycle 14 of treatment today.  Return to clinic in 2 weeks for treatment only and then in 4 weeks for further evaluation and consideration of cycle 16. 2.  Pain: Chronic and unchanged.  Unrelated to his malignancy.  Continue monitoring and treatment per pain clinic. 3.  Nausea: Patient does not complain of this today.  Previously, patient asked if it were okay to take CBD Gummies for nausea.  From an oncology standpoint this would be okay, but patient did state concern that it would show a positive urine test.  He was instructed to let pain clinic know that he was taking this for oncologic purposes. 4.  Hypokalemia: Chronic and unchanged.  Monitor. 5.  Anemia: Resolved.   Patient expressed understanding and was in agreement with this plan. He also understands that He can call clinic at any time with any questions, concerns, or complaints.   Cancer Staging Squamous cell carcinoma of left lung ([place]) Staging form: Lung, AJCC 8th Edition - Clinical stage from 02/12/2019: Stage IIIA (cT1b, cN2, cM0) -  Signed by [name], MD on 11/01/2019   [name], MD   07/19/2020 10:04 AM

## 2020-07-19 ENCOUNTER — Inpatient Hospital Stay: Payer: Medicare Other

## 2020-07-19 ENCOUNTER — Inpatient Hospital Stay (HOSPITAL_BASED_OUTPATIENT_CLINIC_OR_DEPARTMENT_OTHER): Payer: Medicare Other | Admitting: Oncology

## 2020-07-19 ENCOUNTER — Other Ambulatory Visit: Payer: Self-pay

## 2020-07-19 ENCOUNTER — Encounter: Payer: Self-pay | Admitting: Oncology

## 2020-07-19 VITALS — BP 121/80 | HR 60 | Resp 16

## 2020-07-19 VITALS — BP 127/89 | HR 68 | Temp 98.3°F | Resp 20 | Wt 218.4 lb

## 2020-07-19 DIAGNOSIS — Z809 Family history of malignant neoplasm, unspecified: Secondary | ICD-10-CM | POA: Diagnosis not present

## 2020-07-19 DIAGNOSIS — R110 Nausea: Secondary | ICD-10-CM | POA: Diagnosis not present

## 2020-07-19 DIAGNOSIS — C3432 Malignant neoplasm of lower lobe, left bronchus or lung: Secondary | ICD-10-CM | POA: Diagnosis not present

## 2020-07-19 DIAGNOSIS — Z79899 Other long term (current) drug therapy: Secondary | ICD-10-CM | POA: Diagnosis not present

## 2020-07-19 DIAGNOSIS — C3492 Malignant neoplasm of unspecified part of left bronchus or lung: Secondary | ICD-10-CM | POA: Diagnosis not present

## 2020-07-19 DIAGNOSIS — Z8249 Family history of ischemic heart disease and other diseases of the circulatory system: Secondary | ICD-10-CM | POA: Diagnosis not present

## 2020-07-19 DIAGNOSIS — F17210 Nicotine dependence, cigarettes, uncomplicated: Secondary | ICD-10-CM | POA: Diagnosis not present

## 2020-07-19 DIAGNOSIS — Z833 Family history of diabetes mellitus: Secondary | ICD-10-CM | POA: Diagnosis not present

## 2020-07-19 DIAGNOSIS — E119 Type 2 diabetes mellitus without complications: Secondary | ICD-10-CM | POA: Diagnosis not present

## 2020-07-19 DIAGNOSIS — G8929 Other chronic pain: Secondary | ICD-10-CM | POA: Diagnosis not present

## 2020-07-19 DIAGNOSIS — E876 Hypokalemia: Secondary | ICD-10-CM | POA: Diagnosis not present

## 2020-07-19 DIAGNOSIS — Z886 Allergy status to analgesic agent status: Secondary | ICD-10-CM | POA: Diagnosis not present

## 2020-07-19 DIAGNOSIS — Z5112 Encounter for antineoplastic immunotherapy: Secondary | ICD-10-CM | POA: Diagnosis not present

## 2020-07-19 DIAGNOSIS — Z9049 Acquired absence of other specified parts of digestive tract: Secondary | ICD-10-CM | POA: Diagnosis not present

## 2020-07-19 DIAGNOSIS — Z880 Allergy status to penicillin: Secondary | ICD-10-CM | POA: Diagnosis not present

## 2020-07-19 LAB — CBC WITH DIFFERENTIAL/PLATELET
Abs Immature Granulocytes: 0.04 10*3/uL (ref 0.00–0.07)
Basophils Absolute: 0 10*3/uL (ref 0.0–0.1)
Basophils Relative: 1 %
Eosinophils Absolute: 0.1 10*3/uL (ref 0.0–0.5)
Eosinophils Relative: 2 %
HCT: 40.4 % (ref 39.0–52.0)
Hemoglobin: 13.3 g/dL (ref 13.0–17.0)
Immature Granulocytes: 1 %
Lymphocytes Relative: 32 %
Lymphs Abs: 1.6 10*3/uL (ref 0.7–4.0)
MCH: 30.1 pg (ref 26.0–34.0)
MCHC: 32.9 g/dL (ref 30.0–36.0)
MCV: 91.4 fL (ref 80.0–100.0)
Monocytes Absolute: 0.4 10*3/uL (ref 0.1–1.0)
Monocytes Relative: 7 %
Neutro Abs: 3 10*3/uL (ref 1.7–7.7)
Neutrophils Relative %: 57 %
Platelets: 163 10*3/uL (ref 150–400)
RBC: 4.42 MIL/uL (ref 4.22–5.81)
RDW: 13.9 % (ref 11.5–15.5)
WBC: 5.2 10*3/uL (ref 4.0–10.5)
nRBC: 0 % (ref 0.0–0.2)

## 2020-07-19 LAB — TSH: TSH: 1.062 u[IU]/mL (ref 0.350–4.500)

## 2020-07-19 LAB — COMPREHENSIVE METABOLIC PANEL
ALT: 39 U/L (ref 0–44)
AST: 36 U/L (ref 15–41)
Albumin: 4.3 g/dL (ref 3.5–5.0)
Alkaline Phosphatase: 112 U/L (ref 38–126)
Anion gap: 7 (ref 5–15)
BUN: 8 mg/dL (ref 6–20)
CO2: 27 mmol/L (ref 22–32)
Calcium: 9 mg/dL (ref 8.9–10.3)
Chloride: 103 mmol/L (ref 98–111)
Creatinine, Ser: 0.76 mg/dL (ref 0.61–1.24)
GFR, Estimated: >60 mL/min (ref >60)
Glucose, Bld: 175 mg/dL — ABNORMAL HIGH (ref 70–99)
Potassium: 3.3 mmol/L — ABNORMAL LOW (ref 3.5–5.1)
Sodium: 137 mmol/L (ref 135–145)
Total Bilirubin: 0.7 mg/dL (ref 0.3–1.2)
Total Protein: 7.9 g/dL (ref 6.5–8.1)

## 2020-07-19 MED ORDER — SODIUM CHLORIDE 0.9% FLUSH
10.0000 mL | INTRAVENOUS | Status: DC | PRN
Start: 2020-07-19 — End: 2020-07-19
  Filled 2020-07-19: qty 10

## 2020-07-19 MED ORDER — SODIUM CHLORIDE 0.9 % IV SOLN
Freq: Once | INTRAVENOUS | Status: AC
  Filled 2020-07-19: qty 250

## 2020-07-19 MED ORDER — HEPARIN SOD (PORK) LOCK FLUSH 100 UNIT/ML IV SOLN
500.0000 [IU] | Freq: Once | INTRAVENOUS | Status: DC
Start: 2020-07-19 — End: 2020-07-19
  Filled 2020-07-19: qty 5

## 2020-07-19 MED ORDER — HEPARIN SOD (PORK) LOCK FLUSH 100 UNIT/ML IV SOLN
500.0000 [IU] | Freq: Once | INTRAVENOUS | Status: AC | PRN
  Administered 2020-07-19: 500 [IU]
  Filled 2020-07-19: qty 5

## 2020-07-19 MED ORDER — SODIUM CHLORIDE 0.9 % IV SOLN
10.0000 mg/kg | Freq: Once | INTRAVENOUS | Status: AC
  Administered 2020-07-19: 1000 mg via INTRAVENOUS
  Filled 2020-07-19: qty 20

## 2020-07-19 NOTE — Patient Instructions (Signed)
[facility]  Discharge Instructions: Thank you for choosing [facility] to provide your oncology and hematology care.  If you have a lab appointment with the [facility], please go directly to the [facility] and check in at the registration area.  Wear comfortable clothing and clothing appropriate for easy access to any Portacath or PICC line.   We strive to give you quality time with your provider. You may need to reschedule your appointment if you arrive late (15 or more minutes).  Arriving late affects you and other patients whose appointments are after yours.  Also, if you miss three or more appointments without notifying the office, you may be dismissed from the clinic at the provider's discretion.      For prescription refill requests, have your pharmacy contact our office and allow 72 hours for refills to be completed.    Today you received the following chemotherapy and/or immunotherapy agents ImfinziDurvalumab injection What is this medicine? DURVALUMAB (dur VAL ue mab) is a monoclonal antibody. It is used to treat lung cancer. This medicine may be used for other purposes; ask your health care provider or pharmacist if you have questions. COMMON BRAND NAME(S): IMFINZI What should I tell my health care provider before I take this medicine? They need to know if you have any of these conditions:  autoimmune diseases like Crohn's disease, ulcerative colitis, or lupus  have had or planning to have an allogeneic stem cell transplant (uses someone else's stem cells)  history of organ transplant  history of radiation to the chest  nervous system problems like myasthenia gravis or Guillain-Barre syndrome  an unusual or allergic reaction to durvalumab, other medicines, foods, dyes, or preservatives  pregnant or trying to get pregnant  breast-feeding How should I use this medicine? This medicine is for infusion into a vein. It is  given by a health care professional in a hospital or clinic setting. A special MedGuide will be given to you before each treatment. Be sure to read this information carefully each time. Talk to your pediatrician regarding the use of this medicine in children. Special care may be needed. Overdosage: If you think you have taken too much of this medicine contact a poison control center or emergency room at once. NOTE: This medicine is only for you. Do not share this medicine with others. What if I miss a dose? It is important not to miss your dose. Call your doctor or health care professional if you are unable to keep an appointment. What may interact with this medicine? Interactions have not been studied. This list may not describe all possible interactions. Give your health care provider a list of all the medicines, herbs, non-prescription drugs, or dietary supplements you use. Also tell them if you smoke, drink alcohol, or use illegal drugs. Some items may interact with your medicine. What should I watch for while using this medicine? This drug may make you feel generally unwell. Continue your course of treatment even though you feel ill unless your doctor tells you to stop. You may need blood work done while you are taking this medicine. Do not become pregnant while taking this medicine or for 3 months after stopping it. Women should inform their doctor if they wish to become pregnant or think they might be pregnant. There is a potential for serious side effects to an unborn child. Talk to your health care professional or pharmacist for more information. Do not breast-feed an infant  while taking this medicine or for 3 months after stopping it. What side effects may I notice from receiving this medicine? Side effects that you should report to your doctor or health care professional as soon as possible:  allergic reactions like skin rash, itching or hives, swelling of the face, lips, or  tongue  black, tarry stools  bloody or watery diarrhea  breathing problems  change in emotions or moods  change in sex drive  changes in vision  chest pain or chest tightness  chills  confusion  cough  facial flushing  fever  headache  signs and symptoms of high blood sugar such as dizziness; dry mouth; dry skin; fruity breath; nausea; stomach pain; increased hunger or thirst; increased urination  signs and symptoms of liver injury like dark yellow or brown urine; general ill feeling or flu-like symptoms; light-colored stools; loss of appetite; nausea; right upper belly pain; unusually weak or tired; yellowing of the eyes or skin  stomach pain  trouble passing urine or change in the amount of urine  weight gain or weight loss Side effects that usually do not require medical attention (report these to your doctor or health care professional if they continue or are bothersome):  bone pain  constipation  loss of appetite  muscle pain  nausea  swelling of the ankles, feet, hands  tiredness This list may not describe all possible side effects. Call your doctor for medical advice about side effects. You may report side effects to FDA at 1-800-FDA-1088. Where should I keep my medicine? This drug is given in a hospital or clinic and will not be stored at home. NOTE: This sheet is a summary. It may not cover all possible information. If you have questions about this medicine, talk to your doctor, pharmacist, or health care provider.  2021 Elsevier/Gold Standard (2019-04-29 13:01:29)       To help prevent nausea and vomiting after your treatment, we encourage you to take your nausea medication as directed.  BELOW ARE SYMPTOMS THAT SHOULD BE REPORTED IMMEDIATELY: . *FEVER GREATER THAN 100.4 F (38 C) OR HIGHER . *CHILLS OR SWEATING . *NAUSEA AND VOMITING THAT IS NOT CONTROLLED WITH YOUR NAUSEA MEDICATION . *UNUSUAL SHORTNESS OF BREATH . *UNUSUAL BRUISING OR  BLEEDING . *URINARY PROBLEMS (pain or burning when urinating, or frequent urination) . *BOWEL PROBLEMS (unusual diarrhea, constipation, pain near the anus) . TENDERNESS IN MOUTH AND THROAT WITH OR WITHOUT PRESENCE OF ULCERS (sore throat, sores in mouth, or a toothache) . UNUSUAL RASH, SWELLING OR PAIN  . UNUSUAL VAGINAL DISCHARGE OR ITCHING   Items with * indicate a potential emergency and should be followed up as soon as possible or go to the Emergency Department if any problems should occur.  Please show the CHEMOTHERAPY ALERT CARD or IMMUNOTHERAPY ALERT CARD at check-in to the Emergency Department and triage nurse.  Should you have questions after your visit or need to cancel or reschedule your appointment, please contact [facility]  [phone] and follow the prompts.  Office hours are 8:00 a.m. to 4:30 p.m. Monday - Friday. Please note that voicemails left after 4:00 p.m. may not be returned until the following business day.  We are closed weekends and major holidays. You have access to a nurse at all times for urgent questions. Please call the main number to the clinic [phone] and follow the prompts.  For any non-urgent questions, you may also contact your provider using MyChart. We now offer e-Visits  for anyone 44 and older to request care online for non-urgent symptoms. For details visit [email or web address].   Also download the MyChart app! Go to the app store, search "MyChart", open the app, select [facility], and log in with your MyChart username and password.  Due to Covid, a mask is required upon entering the hospital/clinic. If you do not have a mask, one will be given to you upon arrival. For doctor visits, patients may have 1 support person aged 52 or older with them. For treatment visits, patients cannot have anyone with them due to current Covid guidelines and our immunocompromised population.

## 2020-07-19 NOTE — Progress Notes (Signed)
Patient denies any concerns today.  

## 2020-07-20 LAB — T4: T4, Total: 6.8 ug/dL (ref 4.5–12.0)

## 2020-07-21 ENCOUNTER — Ambulatory Visit: Payer: Self-pay | Admitting: General Practice

## 2020-07-21 ENCOUNTER — Telehealth: Payer: Medicare Other | Admitting: General Practice

## 2020-07-21 DIAGNOSIS — Z720 Tobacco use: Secondary | ICD-10-CM

## 2020-07-21 DIAGNOSIS — N181 Chronic kidney disease, stage 1: Secondary | ICD-10-CM | POA: Diagnosis not present

## 2020-07-21 DIAGNOSIS — E1122 Type 2 diabetes mellitus with diabetic chronic kidney disease: Secondary | ICD-10-CM | POA: Diagnosis not present

## 2020-07-21 DIAGNOSIS — E1169 Type 2 diabetes mellitus with other specified complication: Secondary | ICD-10-CM | POA: Diagnosis not present

## 2020-07-21 DIAGNOSIS — I10 Essential (primary) hypertension: Secondary | ICD-10-CM

## 2020-07-21 DIAGNOSIS — C3490 Malignant neoplasm of unspecified part of unspecified bronchus or lung: Secondary | ICD-10-CM

## 2020-07-21 DIAGNOSIS — C3492 Malignant neoplasm of unspecified part of left bronchus or lung: Secondary | ICD-10-CM

## 2020-07-21 DIAGNOSIS — E785 Hyperlipidemia, unspecified: Secondary | ICD-10-CM

## 2020-07-21 DIAGNOSIS — F17210 Nicotine dependence, cigarettes, uncomplicated: Secondary | ICD-10-CM

## 2020-07-21 NOTE — Patient Instructions (Signed)
Visit Information  PATIENT GOALS: Patient Care Plan: RNCM: Cancer Treatment Phase (Adult)- Lung cancer    Problem Identified: TNCM: Cancer treatment plan   Priority: High    Goal: RNCM: Cancer treatment- actie phase of treatment   Priority: High  Note:   Current Barriers:  [name] Knowledge Deficits related to treatment plan and how long treatments will last for current phase of lung cancer treatment . Chronic Disease Management support and education needs related to effective management of lung cancer  . Lacks caregiver support.  . Unable to independently manage lung cancer to the left lung . Lacks social connections . Does not contact provider office for questions/concerns  Nurse Case Manager Clinical Goal(s):  . patient will verbalize understanding of plan for effective management of lung cancer and continuing treatment regimen  . patient will work with RNCM, pcp, and specialist  to address needs related to lung cancer and ongoing treatment  . patient will demonstrate a decrease in side effects exacerbations as evidenced by maintaining weight, tolerating diet, taking medications to help with potential side effects and working with the CCM team to optimize health and well being . patient will attend all scheduled medical appointments: 08-29-2020 at 0820 am . the patient will demonstrate ongoing self health care management ability as evidenced by completion of chemotherapy and improved condition   Interventions:  . 1:1 collaboration with [name], DO regarding development and update of comprehensive plan of care as evidenced by provider attestation and co-signature . Inter-disciplinary care team collaboration (see longitudinal plan of care) . Evaluation of current treatment plan related to lung cancer  and patient's adherence to plan as established by provider. 07-21-2020: The patient saw the oncologist on 07-19-2020 and got a good report. The cancer is not completely gone but has seen a  big improvement. The patient states that he has 23 more treatments and they will reevaluate. He says he wants as many as he needs because he is going to "beat" this. The patient is very optimistic and is following the plan of care. He states he is sleeping well and eating well. He denies any acute side effects from his cancer treatments. He says he is doing very well according to his oncologist and happy with the results thus far.  . Advised patient to call the office for changes or questions, the patient is seeing specialist regularly  . Provided education to patient re: maintaining weight, monitoring for side effects from chemotherapy, safety, adequate rest and working with the CCM team to optimize health and well being  . Reviewed scheduled/upcoming provider appointments including: [id number] at 0820 am . Discussed plans with patient for ongoing care management follow up and provided patient with direct contact information for care management team  Patient Goals/Self-Care Activities Over the next 120 days, patient will:  - Patient will self administer medications as prescribed Patient will attend all scheduled provider appointments Patient will call pharmacy for medication refills Patient will attend church or other social activities Patient will continue to perform ADL's independently Patient will continue to perform IADL's independently Patient will call provider office for new concerns or questions Patient will work with BSW to address care coordination needs and will continue to work with the clinical team to address health care and disease management related needs.   - alternating periods of activity with rest promoted - coagulation studies reviewed and trended - diet adjustment recommended - fall and injury prevention strategies promoted - gentle oral care promoted - individualized  medical nutrition therapy provided - signs/symptoms of bleeding reviewed  Follow Up Plan: Telephone  follow up appointment with care management team member scheduled for: 09-22-2020 at 230 pm       Task: RNCM: Implement Strategies to Prevent or Monitor Anemia or Bleeding   Note:   Care Management Activities:    - alternating periods of activity with rest promoted - coagulation studies reviewed and trended - diet adjustment recommended - fall and injury prevention strategies promoted - gentle oral care promoted - individualized medical nutrition therapy provided - signs/symptoms of bleeding reviewed       Patient Care Plan: RNCM: Hypertension (Adult)    Problem Identified: RNCM: Hypertension (Hypertension)   Priority: Medium    Long-Range Goal: RNCM: Hypertension Monitored   Priority: Medium  Note:   Objective:  . Last practice recorded BP readings:  . BP Readings from Last 3 Encounters: .  07/19/20 . 121/80 .  07/19/20 . 127/89 .  07/05/20 . 125/84 .    [name] Most recent eGFR/CrCl: No results found for: EGFR  No components found for: CRCL Current Barriers:  [name] Knowledge Deficits related to basic understanding of hypertension pathophysiology and self care management . Knowledge Deficits related to understanding of medications prescribed for management of hypertension . Lacks social connections . Does not contact provider office for questions/concerns Case Manager Clinical Goal(s):  [name] Over the next 120 days, patient will verbalize understanding of plan for hypertension management . Over the next 120 days, patient will attend all scheduled medical appointments: 08-29-2020  . Over the next 120 days, patient will demonstrate improved adherence to prescribed treatment plan for hypertension as evidenced by taking all medications as prescribed, monitoring and recording blood pressure as directed, adhering to low sodium/DASH diet . Over the next 120 days, patient will demonstrate improved health management independence as evidenced by checking blood pressure as directed and notifying PCP  if SBP>150 or DBP > 90, taking all medications as prescribe, and adhering to a low sodium diet as discussed. . Over the next 120 days, patient will verbalize basic understanding of hypertension disease process and self health management plan as evidenced by compliance with medications, diet, and working with the CCM team to optimize health and well being  Interventions:  . Collaboration with [name], DO regarding development and update of comprehensive plan of care as evidenced by provider attestation and co-signature . Inter-disciplinary care team collaboration (see longitudinal plan of care) . Evaluation of current treatment plan related to hypertension self management and patient's adherence to plan as established by provider. 07-21-2020: The patient is doing well. He is very active and walks daily for exercising.  He is eating well and has no new concerns with his blood pressures.  He is working on quitting smoking. He is using a "fake cigarette" and his granddaughter that is 9 is also staying on him about stopping. He wants to do what he can to maintain his health and well being.  . Provided education to patient re: stroke prevention, s/s of heart attack and stroke, DASH diet, complications of uncontrolled blood pressure . Reviewed medications with patient and discussed importance of compliance . Discussed plans with patient for ongoing care management follow up and provided patient with direct contact information for care management team . Advised patient, providing education and rationale, to monitor blood pressure daily and record, calling PCP for findings outside established parameters.  . Reviewed scheduled/upcoming provider appointments including: 08-29-2020 at 0820 am Patient Goals/Self-Care Activities .  Over the next 120 days, patient will:  - Self administers medications as prescribed Attends all scheduled provider appointments Calls provider office for new concerns, questions, or  BP outside discussed parameters Checks BP and records as discussed Follows a low sodium diet/DASH diet - blood pressure trends reviewed - depression screen reviewed - home or ambulatory blood pressure monitoring encouraged Follow Up Plan: Telephone follow up appointment with care management team member scheduled for: 09-22-2020 at 230 pm   Task: RNCM: Identify and Monitor Blood Pressure Elevation   Note:   Care Management Activities:    - blood pressure trends reviewed - depression screen reviewed - home or ambulatory blood pressure monitoring encouraged         Patient verbalizes understanding of instructions provided today and agrees to view in [place].   Telephone follow up appointment with care management team member scheduled for:09-22-2020 at 27 pm  Noreene Larsson RN, MSN, [facility] Mobile: [phone]

## 2020-07-21 NOTE — Chronic Care Management (AMB) (Signed)
Chronic Care Management   CCM RN Visit Note  07/21/2020 Name: Kristopher [name] MRN: [id number] DOB: 07-Oct-1960  Subjective: Kristopher [name] is a 60 y.o. year old male who is a primary care patient of [name], DO. The care management team was consulted for assistance with disease management and care coordination needs.    Engaged with patient by telephone for follow up visit in response to provider referral for case management and/or care coordination services.   Consent to Services:  The patient was given information about Chronic Care Management services, agreed to services, and gave verbal consent prior to initiation of services.  Please see initial visit note for detailed documentation.   Patient agreed to services and verbal consent obtained.   Assessment: Review of patient past medical history, allergies, medications, health status, including review of consultants reports, laboratory and other test data, was performed as part of comprehensive evaluation and provision of chronic care management services.   SDOH (Social Determinants of Health) assessments and interventions performed:    CCM Care Plan  Allergies  Allergen Reactions  . Acetaminophen Swelling  . Aspirin Anaphylaxis  . Epinephrine Anaphylaxis    Does not include albuterol  . Novocain [Procaine] Anaphylaxis  . Penicillins Anaphylaxis    Has patient had a PCN reaction causing immediate rash, facial/tongue/throat swelling, SOB or lightheadedness with hypotension: Yes Has patient had a PCN reaction causing severe rash involving mucus membranes or skin necrosis: No Has patient had a PCN reaction that required hospitalization: Yes Has patient had a PCN reaction occurring within the last 10 years: No If all of the above answers are "NO", then may proceed with Cephalosporin use.   . Strawberry Extract Anaphylaxis  . Shellfish Allergy Hives and Nausea And Vomiting    Outpatient Encounter Medications as of  07/21/2020  Medication Sig Note  . Accu-Chek FastClix Lancets MISC USE TO TEST BLOOD SUGAR 2X A DAY   . albuterol (VENTOLIN HFA) 108 (90 Base) MCG/ACT inhaler Inhale into the lungs.   [name] amLODipine (NORVASC) 5 MG tablet Take 1 tablet (5 mg total) by mouth 2 (two) times daily.   . ARNUITY ELLIPTA 100 MCG/ACT AEPB Inhale 1 puff into the lungs daily.    [name] atorvastatin (LIPITOR) 10 MG tablet TAKE 1 TABLET (10 MG TOTAL) BY MOUTH DAILY AT 6 PM.   . BREZTRI AEROSPHERE 160-9-4.8 MCG/ACT AERO Inhale 2 puffs into the lungs 2 (two) times daily. Maintenance per pulmonology   . diphenhydrAMINE (BENADRYL) 25 MG tablet Take 50 mg by mouth daily.   . empagliflozin (JARDIANCE) 25 MG TABS tablet Take 1 tablet (25 mg total) by mouth daily before breakfast.   . esomeprazole (NEXIUM) 40 MG capsule Take 1 capsule (40 mg total) by mouth daily.   [name] glucose blood (ACCU-CHEK GUIDE) test strip USE TO TEST BLOOD SUGAR 2X A DAY   . hydrALAZINE (APRESOLINE) 100 MG tablet Take 1 tablet (100 mg total) by mouth 2 (two) times daily.   [name] lidocaine (LIDODERM) 5 % Place 1 patch onto the skin daily. Remove & Discard patch within 12 hours or as directed by MD (Patient taking differently: Place 1 patch onto the skin daily as needed (pain). Remove & Discard patch within 12 hours or as directed by MD)   . lidocaine-prilocaine (EMLA) cream Apply 1 application topically as needed. Apply small amount to port site at least 1 hour prior to it being accessed, cover with plastic wrap   . metFORMIN (GLUCOPHAGE) 500 MG  tablet Take 2 tablets (1,000 mg total) by mouth 2 (two) times daily with a meal. 07/12/2020: Takes 500 mg daily   . metoprolol tartrate (LOPRESSOR) 50 MG tablet Take 1 tablet (50 mg total) by mouth 2 (two) times daily.   . montelukast (SINGULAIR) 10 MG tablet Take 10 mg by mouth daily.   [name] morphine (MSIR) 15 MG tablet Take 1 tablet (15 mg total) by mouth every 6 (six) hours as needed for moderate pain or severe pain. Must last 30 days.  06/21/2020: prn  . [START ON 07/23/2020] morphine (MSIR) 15 MG tablet Take 1 tablet (15 mg total) by mouth every 6 (six) hours as needed for moderate pain or severe pain. Must last 30 days. 05/22/2020: FUTURE Prescription. (NOT a DUPLICATE!!) >>>DO NOT DELETE<<< (even if Expired!) See Care Coordination Note from [facility] (Dr. Dossie Arbour)   . Multiple Vitamin (MULTIVITAMIN WITH MINERALS) TABS tablet Take 1 tablet by mouth daily.   . NONFORMULARY OR COMPOUNDED ITEM Apply 1-2 mLs topically 4 (four) times daily as needed. 10% Ketamine/2% Cyclobenzaprine/6% Gabapentin Cream   . nortriptyline (PAMELOR) 25 MG capsule Take 2 capsules (50 mg total) by mouth at bedtime.   . pantoprazole (PROTONIX) 40 MG tablet Take 1 tablet (40 mg total) by mouth daily.   . Potassium Chloride ER 20 MEQ TBCR TAKE 1 TABLET BY MOUTH EVERY DAY   . STIOLTO RESPIMAT 2.5-2.5 MCG/ACT AERS Inhale 2 puffs into the lungs daily. Prn per pulmonology notes   . SUMAtriptan (IMITREX) 50 MG tablet Take 1 tablet (50 mg total) by mouth every 2 (two) hours as needed for migraine. Take 1 tab at onset of migraine. May repeat in 2 hours if headache persists or recurs.    Facility-Administered Encounter Medications as of 07/21/2020  Medication  . 0.9 %  sodium chloride infusion  . sodium chloride flush (NS) 0.9 % injection 10 mL    Patient Active Problem List   Diagnosis Date Noted  . Malignant neoplasm of lung ([place])   . Goals of care, counseling/discussion 11/01/2019  . Pulmonary emphysema ([place]) 08/25/2019  . OSA (obstructive sleep apnea) 06/16/2019  . History of colonic polyps 02/02/2019  . Shortness of breath 02/02/2019  . Chronic upper extremity pain (Third area of Pain) (Bilateral) (L>R) 01/19/2019  . Squamous cell carcinoma of left lung ([place]) 01/16/2019  . Lung mass 01/14/2019  . Left lower lobe pulmonary nodule 10/26/2018  . History of Allergy to amide type local anesthetic (Lidocaine) 10/21/2018    Class: History of  .  Hyperlipidemia associated with type 2 diabetes mellitus ([place]) 07/20/2018  . Type 2 diabetes mellitus with stage 1 chronic kidney disease, without long-term current use of insulin ([place]) 12/30/2017  . S/P hip replacement 12/02/2017  . Hip arthritis 10/01/2017  . Aortic atherosclerosis ([place]) 08/25/2017  . Left-sided weakness 08/13/2017  . Musculoskeletal pain, chronic 04/03/2017  . History of tobacco abuse 06/24/2016  . GERD (gastroesophageal reflux disease) 06/24/2016  . Tobacco abuse 06/24/2016  . Pharmacologic therapy   . Benign neoplasm of ascending colon   . Polyp of sigmoid colon   . Rectal polyp   . Benign hypertensive renal disease 01/22/2016  . Migraine 01/22/2016  . Chronic pain syndrome 01/11/2016  . Chronic sacroiliac joint pain (Bilateral) (L>R) 05/31/2015  . Chronic hip pain (Left) 05/31/2015  . Lumbar facet syndrome (Location of Primary Source of Pain) (Bilateral) (L>R) 05/31/2015  . Chronic lower extremity pain (Secondary area of Pain) (Left) 05/31/2015  . Greater occipital  neuralgia (Right) 05/31/2015  . Retrolisthesis of L5-S1 02/06/2015  . Cervical disc herniation (C4-5 and C5-6) 02/06/2015  . Lumbar disc herniation (L5-S1) 02/06/2015  . Hypokalemia 02/01/2015  . Cervical spinal stenosis (C4-5) 01/06/2015  . Cervical foraminal stenosis (Bilateral C5-6) 01/06/2015  . Chronic low back pain (Primary Area of Pain) (Bilateral) (L>R) 01/05/2015  . Lumbar spondylosis 01/05/2015  . Chronic lumbar radicular pain (Secondary area of Pain) (S1 dermatomal) (Left) 01/05/2015  . 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) 01/05/2015  . Chronic neck pain (posterior midline) (Bilateral) (L>R) 01/05/2015  . Cervical spondylosis 01/05/2015  . Chronic cervical radicular pain (Third area of Pain) (Bilateral) (C5/C6 dermatome) (L>R) 01/05/2015  . Long term current use of opiate analgesic 01/05/2015  . Long term prescription opiate use 01/05/2015  . Opiate use (60  MME/Day) 01/05/2015  . Uncomplicated opioid dependence ([place]) 01/05/2015    Conditions to be addressed/monitored:HTN and Lung cancer, smoking cessation  Care Plan : RNCM: Cancer Treatment Phase (Adult)- Lung cancer  Updates made by Vanita Ingles since 07/21/2020 12:00 AM    Problem: TNCM: Cancer treatment plan   Priority: High    Goal: RNCM: Cancer treatment- actie phase of treatment   Priority: High  Note:   Current Barriers:  [name] Knowledge Deficits related to treatment plan and how long treatments will last for current phase of lung cancer treatment . Chronic Disease Management support and education needs related to effective management of lung cancer  . Lacks caregiver support.  . Unable to independently manage lung cancer to the left lung . Lacks social connections . Does not contact provider office for questions/concerns  Nurse Case Manager Clinical Goal(s):  . patient will verbalize understanding of plan for effective management of lung cancer and continuing treatment regimen  . patient will work with RNCM, pcp, and specialist  to address needs related to lung cancer and ongoing treatment  . patient will demonstrate a decrease in side effects exacerbations as evidenced by maintaining weight, tolerating diet, taking medications to help with potential side effects and working with the CCM team to optimize health and well being . patient will attend all scheduled medical appointments: 08-29-2020 at 0820 am . the patient will demonstrate ongoing self health care management ability as evidenced by completion of chemotherapy and improved condition   Interventions:  . 1:1 collaboration with [name], DO regarding development and update of comprehensive plan of care as evidenced by provider attestation and co-signature . Inter-disciplinary care team collaboration (see longitudinal plan of care) . Evaluation of current treatment plan related to lung cancer  and patient's adherence  to plan as established by provider. 07-21-2020: The patient saw the oncologist on 07-19-2020 and got a good report. The cancer is not completely gone but has seen a big improvement. The patient states that he has 23 more treatments and they will reevaluate. He says he wants as many as he needs because he is going to "beat" this. The patient is very optimistic and is following the plan of care. He states he is sleeping well and eating well. He denies any acute side effects from his cancer treatments. He says he is doing very well according to his oncologist and happy with the results thus far.  . Advised patient to call the office for changes or questions, the patient is seeing specialist regularly  . Provided education to patient re: maintaining weight, monitoring for side effects from chemotherapy, safety, adequate rest and working with the CCM  team to optimize health and well being  . Reviewed scheduled/upcoming provider appointments including: [id number] at 0820 am . Discussed plans with patient for ongoing care management follow up and provided patient with direct contact information for care management team  Patient Goals/Self-Care Activities Over the next 120 days, patient will:  - Patient will self administer medications as prescribed Patient will attend all scheduled provider appointments Patient will call pharmacy for medication refills Patient will attend church or other social activities Patient will continue to perform ADL's independently Patient will continue to perform IADL's independently Patient will call provider office for new concerns or questions Patient will work with BSW to address care coordination needs and will continue to work with the clinical team to address health care and disease management related needs.   - alternating periods of activity with rest promoted - coagulation studies reviewed and trended - diet adjustment recommended - fall and injury prevention strategies  promoted - gentle oral care promoted - individualized medical nutrition therapy provided - signs/symptoms of bleeding reviewed  Follow Up Plan: Telephone follow up appointment with care management team member scheduled for: 09-22-2020 at 230 pm       Care Plan : RNCM: Hypertension (Adult)  Updates made by Vanita Ingles since 07/21/2020 12:00 AM    Problem: RNCM: Hypertension (Hypertension)   Priority: Medium    Long-Range Goal: RNCM: Hypertension Monitored   Priority: Medium  Note:   Objective:  . Last practice recorded BP readings:  . BP Readings from Last 3 Encounters: .  07/19/20 . 121/80 .  07/19/20 . 127/89 .  07/05/20 . 125/84 .    [name] Most recent eGFR/CrCl: No results found for: EGFR  No components found for: CRCL Current Barriers:  [name] Knowledge Deficits related to basic understanding of hypertension pathophysiology and self care management . Knowledge Deficits related to understanding of medications prescribed for management of hypertension . Lacks social connections . Does not contact provider office for questions/concerns Case Manager Clinical Goal(s):  [name] Over the next 120 days, patient will verbalize understanding of plan for hypertension management . Over the next 120 days, patient will attend all scheduled medical appointments: 08-29-2020  . Over the next 120 days, patient will demonstrate improved adherence to prescribed treatment plan for hypertension as evidenced by taking all medications as prescribed, monitoring and recording blood pressure as directed, adhering to low sodium/DASH diet . Over the next 120 days, patient will demonstrate improved health management independence as evidenced by checking blood pressure as directed and notifying PCP if SBP>150 or DBP > 90, taking all medications as prescribe, and adhering to a low sodium diet as discussed. . Over the next 120 days, patient will verbalize basic understanding of hypertension disease process and self health  management plan as evidenced by compliance with medications, diet, and working with the CCM team to optimize health and well being  Interventions:  . Collaboration with [name], DO regarding development and update of comprehensive plan of care as evidenced by provider attestation and co-signature . Inter-disciplinary care team collaboration (see longitudinal plan of care) . Evaluation of current treatment plan related to hypertension self management and patient's adherence to plan as established by provider. 07-21-2020: The patient is doing well. He is very active and walks daily for exercising.  He is eating well and has no new concerns with his blood pressures.  He is working on quitting smoking. He is using a "fake cigarette" and his granddaughter that is 31 is  also staying on him about stopping. He wants to do what he can to maintain his health and well being.  . Provided education to patient re: stroke prevention, s/s of heart attack and stroke, DASH diet, complications of uncontrolled blood pressure . Reviewed medications with patient and discussed importance of compliance . Discussed plans with patient for ongoing care management follow up and provided patient with direct contact information for care management team . Advised patient, providing education and rationale, to monitor blood pressure daily and record, calling PCP for findings outside established parameters.  . Reviewed scheduled/upcoming provider appointments including: 08-29-2020 at 0820 am Patient Goals/Self-Care Activities . Over the next 120 days, patient will:  - Self administers medications as prescribed Attends all scheduled provider appointments Calls provider office for new concerns, questions, or BP outside discussed parameters Checks BP and records as discussed Follows a low sodium diet/DASH diet - blood pressure trends reviewed - depression screen reviewed - home or ambulatory blood pressure monitoring  encouraged Follow Up Plan: Telephone follow up appointment with care management team member scheduled for: 09-22-2020 at 230 pm     Plan:Telephone follow up appointment with care management team member scheduled for:  09-22-2020 at 230 pm  [place], MSN, [facility] Mobile: [phone]

## 2020-07-28 ENCOUNTER — Telehealth: Payer: Self-pay | Admitting: Pharmacist

## 2020-07-28 MED ORDER — ATORVASTATIN CALCIUM 10 MG PO TABS: 10.0000 mg | ORAL_TABLET | Freq: Every day | ORAL | 1 refills | Status: DC

## 2020-07-28 NOTE — Telephone Encounter (Signed)
Please send new RX for atorvastatin to [facility]. Patient no longer uses [facility]. Thank You

## 2020-08-01 NOTE — Patient Instructions (Addendum)
Visit Information  It was a pleasure speaking with you today. Thank you for letting me be part of your clinical team. Please call with any questions or concerns.   Goals Addressed              This Visit's Progress   .  COMPLETED: PharmD "I don't like taking medications" (pt-stated)         CARE PLAN ENTRY (see longtitudinal plan of care for additional care plan information)  Current Barriers:  . Financial, social, and community barriers:  o Bronchoscopy 10/15/19 revealed recurrence of squamous cell lung cancer now stage IIIa with mets to lymph nodes. Kristopher [name] is s/p four radiation therapy session and one session of chemo. He states there are 41 weekly chemo sessions planned. He has chemo ( carboplatin and paclitaxel) on Wednesday's and XRT M, Tu,Th, Fri. Is experiencing decreased appetite and would like to try CBD gummies but has been able to get in touch with pain management for approval. . Polypharmacy; complex patient with multiple comorbidities including recurrent lung cancer, tobacco abuse, HTN, migraines o T2DM: uncontrolled, last A1c 8.4%. Metformin 7322 mg BID, Trulicity 1.5 mg weekly  sugars improved, fastings generally 120-150s. He is checking his BG regularly with FBG yesterday of 150. He has not yet checked today.  o HTN: amlodipine 5 mg BID, hydralazine 100 mg BID, metoprolol tartrate 50mg  BID. Home BP and office SBP at goal <140.  o ASCVD risk reduction: atorvastatin 10 mg daily; last LDL  702/2021 7, goal < 70 given  DM and ASCVD risk factors  o Chronic pain: nortriptyline 50 mg QPM; morphine IR 15 mg, Q4-6H PRN (taking 3-4 doses daily); unable to get oxy ir so changed to MS IR --reports good relief  o Tobacco abuse: currently smoking ~ 3-5 cigarettes per day. Notes that he smokes after meals but has not been finishing entire cigarette. He has NRT patches and gum. High desire to quit smoking.We discussing [place]uit[place] resources.  o COPD stage 2B followed by Pulmonolgy: Brextri  2 puffs bid, Stiolto 2 puffs daily as needed. Follow up with pulm October. Denies shortness of breath o Dentures-  received new set yesterday.   Pharmacist Clinical Goal(s):  [name] Over the next 90 days, patient will work with PharmD and provider towards optimized medication management  Interventions: . Comprehensive medication review performed, medication list updated in electronic medical record . Inter-disciplinary care team collaboration (see longitudinal plan of care) . Praised for progress towards goal A1c. We discussed diet extensively. He reports drinking 3 6-packs of Mt. Dew daily which is down from 14 6-packs previously. We discussed the effects of sugary drinks on BG. He understands and is cutting back--gets headaches if he goes without. Encouraged him to incorporate water by drinking half a glass of water with every Mt. Dew. He states water gives him diarrhea but is willing to try. . Praised for attainment of  goal BP.   [name] Motivational interviewing regarding tobacco cessation. Praised patient for continued progress. Discussed NCQitline resources and the benefit of a quit  coach.Reviewed short term and long term benefits of tobacco cessation. He will ask his wife to help him register on the website .   Patient Self Care Activities:  . Patient will take medications as prescribed  Please see past updates related to this goal by clicking on the "Past Updates" button in the selected goal      .  PharmD Monitor and Manage My Blood Sugar-Diabetes Type 2  Timeframe:  Long-Range Goal Priority:  High Start Date:                             Expected End Date:                       Follow Up Date 2 month follow up    - check blood sugar at prescribed times - check blood sugar if I feel it is too high or too low - enter blood sugar readings and medication or insulin into daily log - take the blood sugar log to all doctor visits - take the blood sugar meter to all doctor visits     Why is this important?    Checking your blood sugar at home helps to keep it from getting very high or very low.   Writing the results in a diary or log helps the doctor know how to care for you.   Your blood sugar log should have the time, date and the results.   Also, write down the amount of insulin or other medicine that you take.   Other information, like what you ate, exercise done and how you were feeling, will also be helpful.     Notes:     .  PharmD-Track and Manage My Blood Pressure-Hypertension        Timeframe:  Long-Range Goal Priority:  Medium Start Date:                             Expected End Date:                       Follow Up Date 2 month follow up   - check blood pressure 3 times per week - write blood pressure results in a log or diary    Why is this important?    You won't feel high blood pressure, but it can still hurt your blood vessels.   High blood pressure can cause heart or kidney problems. It can also cause a stroke.   Making lifestyle changes like losing a little weight or eating less salt will help.   Checking your blood pressure at home and at different times of the day can help to control blood pressure.   If the doctor prescribes medicine remember to take it the way the doctor ordered.   Call the office if you cannot afford the medicine or if there are questions about it.     Notes:        The patient verbalized understanding of instructions, educational materials, and care plan provided today and agreed to receive a mailed copy of patient instructions, educational materials, and care plan.   Telephone follow up appointment with pharmacy team member scheduled for: 2 months  Junita Push. [place] PharmD, BCPS Clinical Pharmacist [phone]  Preventing Diabetes Mellitus Complications You can help to prevent or slow down problems that are caused by diabetes (diabetes mellitus). Following your diabetes plan and taking care of  yourself can reduce your risk of serious or life-threatening complications. What actions can I take to prevent diabetes complications? Diabetes management  Follow instructions from your health care providers about managing your diabetes. Your diabetes may be managed by a team of health care providers who can teach you how to care for yourself and can answer questions that you have.  Educate yourself about your condition so you can make healthy choices about eating and physical activity.  Know your target range for your blood sugar (glucose), and check your blood glucose level as often as told. Your health care provider will help you decide how often to check your blood glucose level depending on your treatment goals and how well you are meeting them.  Ask your health care provider if you should take low-dose aspirin daily and what dose is recommended for you. Taking low-dose aspirin daily is recommended to help prevent cardiovascular disease.   Controlling your blood pressure and cholesterol Your personal target blood pressure is determined based on:  Your age.  Your medicines.  How long you have had diabetes.  Any other medical conditions you have. To control your blood pressure:  Follow instructions from your health care provider about meal planning, exercise, and medicines.  Make sure your health care provider checks your blood pressure at every medical visit.  Monitor your blood pressure at home as told by your health care provider. To control your cholesterol:  Follow instructions from your health care provider about meal planning, exercise, and medicines.  Have your cholesterol checked at least once a year.  You may be prescribed medicine to lower cholesterol (statin). If you are not taking a statin, ask your health care provider if you should be. Controlling your cholesterol may:  Help prevent heart disease and stroke. These are the most common health problems for people  with diabetes.  Improve your blood flow.   Medical appointments and vaccines Schedule and keep yearly physical exams and eye exams. Your health care provider will tell you how often you need medical visits depending on your diabetes management plan. Keep all follow-up visits as told. This is important so possible problems can be identified early and complications can be avoided or treated.  Every visit with your health care provider should include measuring your: ? Weight. ? Blood pressure. ? Blood glucose control.  Your A1C (hemoglobin A1C) level should be checked: ? At least 2 times a year, if you are meeting your treatment goals. ? 4 times a year, if you are not meeting treatment goals or if your treatment goals have changed.  Your blood lipids (lipid profile) should be checked yearly. You should also be checked yearly for protein in your urine (urine microalbumin).  If you have type 1 diabetes, get an eye exam 3-5 years after you are diagnosed, and then once a year after your first exam.  If you have type 2 diabetes, get an eye exam as soon as you are diagnosed, and then once a year after your first exam. It is also important to keep your vaccines current. It is recommended that you receive:  A flu (influenza) vaccine every year.  A pneumonia (pneumococcal) vaccine and a hepatitis B vaccine. If you are age 20 or older, you may get the pneumonia vaccine as a series of two separate shots. Ask your health care provider which other vaccines may be recommended. Lifestyle  Do not use any products that contain nicotine or tobacco, such as cigarettes, e-cigarettes, and chewing tobacco. If you need help quitting, ask your health care provider. By avoiding nicotine and tobacco: ? You will lower your risk for heart attack, stroke, nerve disease, and kidney disease. ? Your cholesterol and blood pressure may improve. ? Your blood circulation will improve.  If you drink alcohol: ? Limit how  much you use to:  0-1 drink a  day for women who are not pregnant.  0-2 drinks a day for men. ? Be aware of how much alcohol is in your drink. In the [place] one drink equals one 12 oz bottle of beer (355 mL), one 5 oz glass of wine (148 mL), or one 11?2 oz glass of hard liquor (44 mL). Taking care of your feet Diabetes may cause you to have poor blood circulation to your legs and feet. Because of this, taking care of your feet is very important. Diabetes can cause:  The skin on the feet to get thinner, break more easily, and heal more slowly.  Nerve damage in your legs and feet, which results in decreased feeling. You may not notice minor injuries that could lead to serious problems. To avoid foot problems:  Check your skin and feet every day for cuts, bruises, redness, blisters, or sores.  Schedule a foot exam with your health care provider once every year. This exam includes: ? Inspecting the structure and skin of your feet. ? Checking the pulses and sensation in your feet.  Make sure that your health care provider performs a visual foot exam at every medical visit.   Taking care of your teeth People with poorly controlled diabetes are more likely to have gum (periodontal) disease. Diabetes can make periodontal diseases harder to control. If not treated, periodontal diseases can lead to tooth loss. To prevent this:  Brush your teeth twice a day.  Floss at least once a day.  Visit your dentist 2 times a year. Managing stress Living with diabetes can be stressful. When you are experiencing stress, your blood glucose may be affected in two ways:  Stress hormones may cause your blood glucose to rise.  You may be distracted from taking good care of yourself. Be aware of your stress level and make changes to help you manage challenging situations. To lower your stress levels:  Consider joining a support group.  Do planned relaxation or meditation.  Do a hobby that you  enjoy.  Maintain healthy relationships.  Exercise regularly.  Work with your health care provider or a mental health professional. Where to find more information  American Diabetes Association: www.diabetes.org  Association of Diabetes Care and Education Specialists: www.diabeteseducator.org Summary  You can take action to prevent or slow down problems that are caused by diabetes (diabetes mellitus). Following your diabetes plan and taking care of yourself can reduce your risk of serious or life-threatening complications.  Follow instructions from your health care providers about managing your diabetes. Your diabetes may be managed by a team of health care providers who can teach you how to care for yourself and can answer questions that you have.  Know your target range for your blood sugar (glucose), and check your blood glucose levels as often as told. Your health care provider will help you decide how often you should check your blood glucose level depending on your treatment goals and how well you are meeting them.  Your health care provider will tell you how often you need medical visits depending on your diabetes management plan. Keep all follow-up visits as directed. This is important so possible problems can be identified early and complications can be avoided or treated. This information is not intended to replace advice given to you by your health care provider. Make sure you discuss any questions you have with your health care provider. Document Revised: 04/09/2019 Document Reviewed: 04/09/2019 Elsevier Patient Education  2021 [employer].

## 2020-08-02 ENCOUNTER — Inpatient Hospital Stay: Payer: Medicare Other | Attending: Oncology

## 2020-08-02 VITALS — BP 123/90 | HR 67 | Temp 97.8°F | Resp 17 | Wt 219.2 lb

## 2020-08-02 DIAGNOSIS — Z8249 Family history of ischemic heart disease and other diseases of the circulatory system: Secondary | ICD-10-CM | POA: Diagnosis not present

## 2020-08-02 DIAGNOSIS — E876 Hypokalemia: Secondary | ICD-10-CM | POA: Insufficient documentation

## 2020-08-02 DIAGNOSIS — Z5112 Encounter for antineoplastic immunotherapy: Secondary | ICD-10-CM | POA: Insufficient documentation

## 2020-08-02 DIAGNOSIS — G8929 Other chronic pain: Secondary | ICD-10-CM | POA: Insufficient documentation

## 2020-08-02 DIAGNOSIS — I129 Hypertensive chronic kidney disease with stage 1 through stage 4 chronic kidney disease, or unspecified chronic kidney disease: Secondary | ICD-10-CM | POA: Diagnosis not present

## 2020-08-02 DIAGNOSIS — E1122 Type 2 diabetes mellitus with diabetic chronic kidney disease: Secondary | ICD-10-CM | POA: Insufficient documentation

## 2020-08-02 DIAGNOSIS — R110 Nausea: Secondary | ICD-10-CM | POA: Diagnosis not present

## 2020-08-02 DIAGNOSIS — Z79899 Other long term (current) drug therapy: Secondary | ICD-10-CM | POA: Diagnosis not present

## 2020-08-02 DIAGNOSIS — C3432 Malignant neoplasm of lower lobe, left bronchus or lung: Secondary | ICD-10-CM | POA: Insufficient documentation

## 2020-08-02 DIAGNOSIS — Z886 Allergy status to analgesic agent status: Secondary | ICD-10-CM | POA: Insufficient documentation

## 2020-08-02 DIAGNOSIS — Z809 Family history of malignant neoplasm, unspecified: Secondary | ICD-10-CM | POA: Diagnosis not present

## 2020-08-02 DIAGNOSIS — N181 Chronic kidney disease, stage 1: Secondary | ICD-10-CM | POA: Diagnosis not present

## 2020-08-02 DIAGNOSIS — Z9049 Acquired absence of other specified parts of digestive tract: Secondary | ICD-10-CM | POA: Diagnosis not present

## 2020-08-02 DIAGNOSIS — Z880 Allergy status to penicillin: Secondary | ICD-10-CM | POA: Insufficient documentation

## 2020-08-02 DIAGNOSIS — C3492 Malignant neoplasm of unspecified part of left bronchus or lung: Secondary | ICD-10-CM

## 2020-08-02 DIAGNOSIS — Z833 Family history of diabetes mellitus: Secondary | ICD-10-CM | POA: Insufficient documentation

## 2020-08-02 MED ORDER — SODIUM CHLORIDE 0.9 % IV SOLN
Freq: Once | INTRAVENOUS | Status: AC
  Filled 2020-08-02: qty 250

## 2020-08-02 MED ORDER — SODIUM CHLORIDE 0.9 % IV SOLN
10.0000 mg/kg | Freq: Once | INTRAVENOUS | Status: AC
  Administered 2020-08-02: 1000 mg via INTRAVENOUS
  Filled 2020-08-02: qty 20

## 2020-08-02 MED ORDER — HEPARIN SOD (PORK) LOCK FLUSH 100 UNIT/ML IV SOLN
500.0000 [IU] | Freq: Once | INTRAVENOUS | Status: AC | PRN
  Administered 2020-08-02: 500 [IU]
  Filled 2020-08-02: qty 5

## 2020-08-02 MED ORDER — HEPARIN SOD (PORK) LOCK FLUSH 100 UNIT/ML IV SOLN
INTRAVENOUS | Status: AC
  Filled 2020-08-02: qty 5

## 2020-08-02 NOTE — Patient Instructions (Signed)
CANCER CENTER [facility] REGIONAL MEDICAL ONCOLOGY    Durvalumab injection What is this medicine? DURVALUMAB (dur VAL ue mab) is a monoclonal antibody. It is used to treat lung cancer. This medicine may be used for other purposes; ask your health care provider or pharmacist if you have questions. COMMON BRAND NAME(S): IMFINZI What should I tell my health care provider before I take this medicine? They need to know if you have any of these conditions:  autoimmune diseases like Crohn's disease, ulcerative colitis, or lupus  have had or planning to have an allogeneic stem cell transplant (uses someone else's stem cells)  history of organ transplant  history of radiation to the chest  nervous system problems like myasthenia gravis or Guillain-Barre syndrome  an unusual or allergic reaction to durvalumab, other medicines, foods, dyes, or preservatives  pregnant or trying to get pregnant  breast-feeding How should I use this medicine? This medicine is for infusion into a vein. It is given by a health care professional in a hospital or clinic setting. A special MedGuide will be given to you before each treatment. Be sure to read this information carefully each time. Talk to your pediatrician regarding the use of this medicine in children. Special care may be needed. Overdosage: If you think you have taken too much of this medicine contact a poison control center or emergency room at once. NOTE: This medicine is only for you. Do not share this medicine with others. What if I miss a dose? It is important not to miss your dose. Call your doctor or health care professional if you are unable to keep an appointment. What may interact with this medicine? Interactions have not been studied. This list may not describe all possible interactions. Give your health care provider a list of all the medicines, herbs, non-prescription drugs, or dietary supplements you use. Also tell them if you smoke, drink  alcohol, or use illegal drugs. Some items may interact with your medicine. What should I watch for while using this medicine? This drug may make you feel generally unwell. Continue your course of treatment even though you feel ill unless your doctor tells you to stop. You may need blood work done while you are taking this medicine. Do not become pregnant while taking this medicine or for 3 months after stopping it. Women should inform their doctor if they wish to become pregnant or think they might be pregnant. There is a potential for serious side effects to an unborn child. Talk to your health care professional or pharmacist for more information. Do not breast-feed an infant while taking this medicine or for 3 months after stopping it. What side effects may I notice from receiving this medicine? Side effects that you should report to your doctor or health care professional as soon as possible:  allergic reactions like skin rash, itching or hives, swelling of the face, lips, or tongue  black, tarry stools  bloody or watery diarrhea  breathing problems  change in emotions or moods  change in sex drive  changes in vision  chest pain or chest tightness  chills  confusion  cough  facial flushing  fever  headache  signs and symptoms of high blood sugar such as dizziness; dry mouth; dry skin; fruity breath; nausea; stomach pain; increased hunger or thirst; increased urination  signs and symptoms of liver injury like dark yellow or brown urine; general ill feeling or flu-like symptoms; light-colored stools; loss of appetite; nausea; right upper belly pain; unusually  weak or tired; yellowing of the eyes or skin  stomach pain  trouble passing urine or change in the amount of urine  weight gain or weight loss Side effects that usually do not require medical attention (report these to your doctor or health care professional if they continue or are bothersome):  bone  pain  constipation  loss of appetite  muscle pain  nausea  swelling of the ankles, feet, hands  tiredness This list may not describe all possible side effects. Call your doctor for medical advice about side effects. You may report side effects to FDA at 1-800-FDA-1088. Where should I keep my medicine? This drug is given in a hospital or clinic and will not be stored at home. NOTE: This sheet is a summary. It may not cover all possible information. If you have questions about this medicine, talk to your doctor, pharmacist, or health care provider.  2021 Elsevier/Gold Standard (2019-04-29 13:01:29) Discharge Instructions: Thank you for choosing [facility] to provide your oncology and hematology care.  If you have a lab appointment with the [facility], please go directly to the [facility] and check in at the registration area.  Wear comfortable clothing and clothing appropriate for easy access to any Portacath or PICC line.   We strive to give you quality time with your provider. You may need to reschedule your appointment if you arrive late (15 or more minutes).  Arriving late affects you and other patients whose appointments are after yours.  Also, if you miss three or more appointments without notifying the office, you may be dismissed from the clinic at the provider's discretion.      For prescription refill requests, have your pharmacy contact our office and allow 72 hours for refills to be completed.    Today you received the following chemotherapy and/or immunotherapy agents durvalumab      To help prevent nausea and vomiting after your treatment, we encourage you to take your nausea medication as directed.  BELOW ARE SYMPTOMS THAT SHOULD BE REPORTED IMMEDIATELY: . *FEVER GREATER THAN 100.4 F (38 C) OR HIGHER . *CHILLS OR SWEATING . *NAUSEA AND VOMITING THAT IS NOT CONTROLLED WITH YOUR NAUSEA MEDICATION . *UNUSUAL SHORTNESS OF BREATH . *UNUSUAL BRUISING  OR BLEEDING . *URINARY PROBLEMS (pain or burning when urinating, or frequent urination) . *BOWEL PROBLEMS (unusual diarrhea, constipation, pain near the anus) . TENDERNESS IN MOUTH AND THROAT WITH OR WITHOUT PRESENCE OF ULCERS (sore throat, sores in mouth, or a toothache) . UNUSUAL RASH, SWELLING OR PAIN  . UNUSUAL VAGINAL DISCHARGE OR ITCHING   Items with * indicate a potential emergency and should be followed up as soon as possible or go to the Emergency Department if any problems should occur.  Please show the CHEMOTHERAPY ALERT CARD or IMMUNOTHERAPY ALERT CARD at check-in to the Emergency Department and triage nurse.  Should you have questions after your visit or need to cancel or reschedule your appointment, please contact [facility]  [phone] and follow the prompts.  Office hours are 8:00 a.m. to 4:30 p.m. Monday - Friday. Please note that voicemails left after 4:00 p.m. may not be returned until the following business day.  We are closed weekends and major holidays. You have access to a nurse at all times for urgent questions. Please call the main number to the clinic [phone] and follow the prompts.  For any non-urgent questions, you may also contact your provider using MyChart. We now  offer e-Visits for anyone 16 and older to request care online for non-urgent symptoms. For details visit [email or web address].   Also download the MyChart app! Go to the app store, search "MyChart", open the app, select [facility], and log in with your MyChart username and password.  Due to Covid, a mask is required upon entering the hospital/clinic. If you do not have a mask, one will be given to you upon arrival. For doctor visits, patients may have 1 support person aged 38 or older with them. For treatment visits, patients cannot have anyone with them due to current Covid guidelines and our immunocompromised population.

## 2020-08-07 ENCOUNTER — Telehealth: Payer: Self-pay | Admitting: Pharmacist

## 2020-08-07 NOTE — Chronic Care Management (AMB) (Signed)
Chronic Care Management Pharmacy Assistant   Name: STAR CHEESE  MRN: [id number] DOB: [birth year]   Reason for Encounter: Disease State    Recent office visits:  None noted  Recent consult visits:  07/19/2020 Delight Hoh, MD (Oncology) Seen for Lung cancer follow up.  Hospital visits:  None in previous 6 months  Medications: Outpatient Encounter Medications as of 08/07/2020  Medication Sig Note  . Accu-Chek FastClix Lancets MISC USE TO TEST BLOOD SUGAR 2X A DAY   . albuterol (VENTOLIN HFA) 108 (90 Base) MCG/ACT inhaler Inhale into the lungs.   [name] amLODipine (NORVASC) 5 MG tablet Take 1 tablet (5 mg total) by mouth 2 (two) times daily.   [name] atorvastatin (LIPITOR) 10 MG tablet Take 1 tablet (10 mg total) by mouth daily at 6 PM.   . BREZTRI AEROSPHERE 160-9-4.8 MCG/ACT AERO Inhale 2 puffs into the lungs 2 (two) times daily. Maintenance per pulmonology   . diphenhydrAMINE (BENADRYL) 25 MG tablet Take 50 mg by mouth daily.   . empagliflozin (JARDIANCE) 25 MG TABS tablet Take 1 tablet (25 mg total) by mouth daily before breakfast.   . esomeprazole (NEXIUM) 40 MG capsule Take 1 capsule (40 mg total) by mouth daily.   [name] glucose blood (ACCU-CHEK GUIDE) test strip USE TO TEST BLOOD SUGAR 2X A DAY   . hydrALAZINE (APRESOLINE) 100 MG tablet Take 1 tablet (100 mg total) by mouth 2 (two) times daily.   [name] lidocaine (LIDODERM) 5 % Place 1 patch onto the skin daily. Remove & Discard patch within 12 hours or as directed by MD (Patient taking differently: Place 1 patch onto the skin daily as needed (pain). Remove & Discard patch within 12 hours or as directed by MD)   . lidocaine-prilocaine (EMLA) cream Apply 1 application topically as needed. Apply small amount to port site at least 1 hour prior to it being accessed, cover with plastic wrap   . metFORMIN (GLUCOPHAGE) 500 MG tablet Take 2 tablets (1,000 mg total) by mouth 2 (two) times daily with a meal. 07/12/2020: Takes 500 mg daily   .  metoprolol tartrate (LOPRESSOR) 50 MG tablet Take 1 tablet (50 mg total) by mouth 2 (two) times daily.   . montelukast (SINGULAIR) 10 MG tablet Take 10 mg by mouth daily.   [name] morphine (MSIR) 15 MG tablet Take 1 tablet (15 mg total) by mouth every 6 (six) hours as needed for moderate pain or severe pain. Must last 30 days. 06/21/2020: prn  . morphine (MSIR) 15 MG tablet Take 1 tablet (15 mg total) by mouth every 6 (six) hours as needed for moderate pain or severe pain. Must last 30 days. 05/22/2020: FUTURE Prescription. (NOT a DUPLICATE!!) >>>DO NOT DELETE<<< (even if Expired!) See Care Coordination Note from [facility] (Dr. Dossie Arbour)   . Multiple Vitamin (MULTIVITAMIN WITH MINERALS) TABS tablet Take 1 tablet by mouth daily.   . NONFORMULARY OR COMPOUNDED ITEM Apply 1-2 mLs topically 4 (four) times daily as needed. 10% Ketamine/2% Cyclobenzaprine/6% Gabapentin Cream   . nortriptyline (PAMELOR) 25 MG capsule Take 2 capsules (50 mg total) by mouth at bedtime.   . Potassium Chloride ER 20 MEQ TBCR TAKE 1 TABLET BY MOUTH EVERY DAY   . STIOLTO RESPIMAT 2.5-2.5 MCG/ACT AERS Inhale 2 puffs into the lungs 4 (four) times daily as needed. Prn per pulmonology notes   . SUMAtriptan (IMITREX) 50 MG tablet Take 1 tablet (50 mg total) by mouth every 2 (two) hours as needed  for migraine. Take 1 tab at onset of migraine. May repeat in 2 hours if headache persists or recurs.    Facility-Administered Encounter Medications as of 08/07/2020  Medication  . 0.9 %  sodium chloride infusion  . sodium chloride flush (NS) 0.9 % injection 10 mL   . Current COPD regimen:        Ventolin HFA 108 90(Base) mcg/act inhale into the lungs       Breztri 160-9-4.8 mcg/act inhale 2 puffs twice daily . No flowsheet data found.   . Any recent hospitalizations or ED visits since last visit with CPP? No   . Denies COPD symptoms, including Increased shortness of breath , Rescue medicine is not helping, Shortness of breath at rest,  Symptoms worse with exercise, Symptoms worse at night and Wheezing   . What recent interventions/DTPs have been made by any provider to improve breathing since last visit:None noted  . Have you had exacerbation/flare-up since last visit? No   . What do you do when you are short of breath?  Rescue medication  Respiratory Devices/Equipment . Do you have a nebulizer? No . Do you use a Peak Flow Meter? No . Do you use a maintenance inhaler? Yes . How often do you forget to use your daily inhaler? Patient states he forgets1-2x during the week to use his daily inhaler. . Do you use a rescue inhaler? No . How often do you use your rescue inhaler?  never . Do you use a spacer with your inhaler? No  Patient states he had some wheezing issues a couple days ago but only because he had a small cold that lasted for about 2-3 days.  Adherence Review: . Does the patient have >5 day gap between last estimated fill date for maintenance inhaler medications? Yes    Star Rating Drugs: Atorvastatin 10 mg Last filled:07/28/2020 90 DS Jardiance 25 mg Last filled:05/29/2020 90 DS Metformin 500 mg Last filled:05/29/2020 90 DS  Myriam Elta [place], [place]

## 2020-08-16 ENCOUNTER — Ambulatory Visit: Payer: Medicare Other

## 2020-08-16 ENCOUNTER — Other Ambulatory Visit: Payer: Self-pay

## 2020-08-16 ENCOUNTER — Inpatient Hospital Stay: Payer: Medicare Other

## 2020-08-16 ENCOUNTER — Inpatient Hospital Stay (HOSPITAL_BASED_OUTPATIENT_CLINIC_OR_DEPARTMENT_OTHER): Payer: Medicare Other | Admitting: Oncology

## 2020-08-16 ENCOUNTER — Encounter: Payer: Self-pay | Admitting: Oncology

## 2020-08-16 VITALS — BP 124/87 | HR 58 | Temp 97.6°F | Resp 16 | Wt 220.0 lb

## 2020-08-16 DIAGNOSIS — G8929 Other chronic pain: Secondary | ICD-10-CM | POA: Diagnosis not present

## 2020-08-16 DIAGNOSIS — R110 Nausea: Secondary | ICD-10-CM | POA: Diagnosis not present

## 2020-08-16 DIAGNOSIS — C3492 Malignant neoplasm of unspecified part of left bronchus or lung: Secondary | ICD-10-CM

## 2020-08-16 DIAGNOSIS — I129 Hypertensive chronic kidney disease with stage 1 through stage 4 chronic kidney disease, or unspecified chronic kidney disease: Secondary | ICD-10-CM | POA: Diagnosis not present

## 2020-08-16 DIAGNOSIS — Z8249 Family history of ischemic heart disease and other diseases of the circulatory system: Secondary | ICD-10-CM | POA: Diagnosis not present

## 2020-08-16 DIAGNOSIS — Z809 Family history of malignant neoplasm, unspecified: Secondary | ICD-10-CM | POA: Diagnosis not present

## 2020-08-16 DIAGNOSIS — Z9049 Acquired absence of other specified parts of digestive tract: Secondary | ICD-10-CM | POA: Diagnosis not present

## 2020-08-16 DIAGNOSIS — Z880 Allergy status to penicillin: Secondary | ICD-10-CM | POA: Diagnosis not present

## 2020-08-16 DIAGNOSIS — Z5112 Encounter for antineoplastic immunotherapy: Secondary | ICD-10-CM | POA: Diagnosis not present

## 2020-08-16 DIAGNOSIS — N181 Chronic kidney disease, stage 1: Secondary | ICD-10-CM | POA: Diagnosis not present

## 2020-08-16 DIAGNOSIS — E1122 Type 2 diabetes mellitus with diabetic chronic kidney disease: Secondary | ICD-10-CM | POA: Diagnosis not present

## 2020-08-16 DIAGNOSIS — E876 Hypokalemia: Secondary | ICD-10-CM | POA: Diagnosis not present

## 2020-08-16 DIAGNOSIS — C3432 Malignant neoplasm of lower lobe, left bronchus or lung: Secondary | ICD-10-CM | POA: Diagnosis not present

## 2020-08-16 DIAGNOSIS — Z833 Family history of diabetes mellitus: Secondary | ICD-10-CM | POA: Diagnosis not present

## 2020-08-16 DIAGNOSIS — Z886 Allergy status to analgesic agent status: Secondary | ICD-10-CM | POA: Diagnosis not present

## 2020-08-16 DIAGNOSIS — Z79899 Other long term (current) drug therapy: Secondary | ICD-10-CM | POA: Diagnosis not present

## 2020-08-16 LAB — CBC WITH DIFFERENTIAL/PLATELET
Abs Immature Granulocytes: 0.03 10*3/uL (ref 0.00–0.07)
Basophils Absolute: 0.1 10*3/uL (ref 0.0–0.1)
Basophils Relative: 1 %
Eosinophils Absolute: 0.1 10*3/uL (ref 0.0–0.5)
Eosinophils Relative: 2 %
HCT: 41 % (ref 39.0–52.0)
Hemoglobin: 13.6 g/dL (ref 13.0–17.0)
Immature Granulocytes: 1 %
Lymphocytes Relative: 31 %
Lymphs Abs: 1.7 10*3/uL (ref 0.7–4.0)
MCH: 29.9 pg (ref 26.0–34.0)
MCHC: 33.2 g/dL (ref 30.0–36.0)
MCV: 90.1 fL (ref 80.0–100.0)
Monocytes Absolute: 0.5 10*3/uL (ref 0.1–1.0)
Monocytes Relative: 9 %
Neutro Abs: 3.1 10*3/uL (ref 1.7–7.7)
Neutrophils Relative %: 56 %
Platelets: 169 10*3/uL (ref 150–400)
RBC: 4.55 MIL/uL (ref 4.22–5.81)
RDW: 13.3 % (ref 11.5–15.5)
WBC: 5.4 10*3/uL (ref 4.0–10.5)
nRBC: 0 % (ref 0.0–0.2)

## 2020-08-16 LAB — COMPREHENSIVE METABOLIC PANEL
ALT: 36 U/L (ref 0–44)
AST: 33 U/L (ref 15–41)
Albumin: 4.2 g/dL (ref 3.5–5.0)
Alkaline Phosphatase: 112 U/L (ref 38–126)
Anion gap: 8 (ref 5–15)
BUN: 10 mg/dL (ref 6–20)
CO2: 28 mmol/L (ref 22–32)
Calcium: 8.9 mg/dL (ref 8.9–10.3)
Chloride: 102 mmol/L (ref 98–111)
Creatinine, Ser: 0.88 mg/dL (ref 0.61–1.24)
GFR, Estimated: >60 mL/min (ref >60)
Glucose, Bld: 159 mg/dL — ABNORMAL HIGH (ref 70–99)
Potassium: 3.1 mmol/L — ABNORMAL LOW (ref 3.5–5.1)
Sodium: 138 mmol/L (ref 135–145)
Total Bilirubin: 0.5 mg/dL (ref 0.3–1.2)
Total Protein: 7.6 g/dL (ref 6.5–8.1)

## 2020-08-16 LAB — TSH: TSH: 0.607 u[IU]/mL (ref 0.350–4.500)

## 2020-08-16 MED ORDER — HEPARIN SOD (PORK) LOCK FLUSH 100 UNIT/ML IV SOLN
INTRAVENOUS | Status: AC
  Filled 2020-08-16: qty 5

## 2020-08-16 MED ORDER — SODIUM CHLORIDE 0.9 % IV SOLN
Freq: Once | INTRAVENOUS | Status: AC
  Filled 2020-08-16: qty 250

## 2020-08-16 MED ORDER — HEPARIN SOD (PORK) LOCK FLUSH 100 UNIT/ML IV SOLN
250.0000 [IU] | Freq: Once | INTRAVENOUS | Status: DC | PRN
  Filled 2020-08-16: qty 5

## 2020-08-16 MED ORDER — HEPARIN SOD (PORK) LOCK FLUSH 100 UNIT/ML IV SOLN
500.0000 [IU] | Freq: Once | INTRAVENOUS | Status: AC
  Administered 2020-08-16: 500 [IU] via INTRAVENOUS
  Filled 2020-08-16: qty 5

## 2020-08-16 MED ORDER — SODIUM CHLORIDE 0.9% FLUSH
10.0000 mL | Freq: Once | INTRAVENOUS | Status: AC
  Administered 2020-08-16: 10 mL via INTRAVENOUS
  Filled 2020-08-16: qty 10

## 2020-08-16 MED ORDER — SODIUM CHLORIDE 0.9 % IV SOLN
10.0000 mg/kg | Freq: Once | INTRAVENOUS | Status: AC
  Administered 2020-08-16: 1000 mg via INTRAVENOUS
  Filled 2020-08-16: qty 20

## 2020-08-16 NOTE — Progress Notes (Signed)
Pt in for follow up, denies any concerns today. 

## 2020-08-16 NOTE — Progress Notes (Signed)
[facility]  [phone] [phone]  ID: Kristopher [name] OB: 1960/06/01  MR#: [id number]  AGT#:[id number]  Patient Care Team: [name], DO as PCP - General (Family Medicine) Gavin Pound, Solomon (Inactive) as Certified Medical Assistant Telford Nab, RN as Registered Nurse Vanita Ingles, RN as Case Manager (General Practice) [name], MD as Referring Physician (Pain Medicine) [name], MD as Consulting Physician (Oncology)  CHIEF COMPLAINT: Clinical stage IIIa squamous cell 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  INTERVAL HISTORY: Patient returns to clinic today for further evaluation and consideration of cycle 16 of maintenance durvalumab.    He continues to tolerate his treatments well without significant side effects.  He currently feels well and is asymptomatic.  He denies any acute symptoms.  He continues to remain active and walks approximately half a mile daily.  He works here and there when he is able.    REVIEW OF SYSTEMS:   Review of Systems  Constitutional: Negative.  Negative for fever, malaise/fatigue and weight loss.  Respiratory: Negative.  Negative for cough and shortness of breath.   Cardiovascular: Negative.  Negative for chest pain and leg swelling.  Gastrointestinal: Negative.  Negative for abdominal pain and heartburn.  Genitourinary: Negative.  Negative for dysuria and flank pain.  Musculoskeletal: Negative.  Negative for back pain.  Skin: Negative.  Negative for rash.  Neurological: Negative.  Negative for dizziness, focal weakness, weakness and headaches.  Psychiatric/Behavioral: Negative.  The patient is not nervous/anxious.    As per HPI. Otherwise, a complete review of systems is negative.  PAST MEDICAL HISTORY: Past Medical History:  Diagnosis Date   Allergy    Arthritis    left foot   Benign hypertensive kidney disease    Chronic back pain    Four rods in back    Diabetes mellitus, type 2 (HCC)    Dyspnea    GERD (gastroesophageal reflux disease)    Hypertension    Malignant neoplasm of lung (HCC)    Migraines    daily    PAST SURGICAL HISTORY: Past Surgical History:  Procedure Laterality Date   APPENDECTOMY     BACK SURGERY     COLONOSCOPY WITH PROPOFOL N/A 02/19/2016   Procedure: COLONOSCOPY WITH PROPOFOL;  Surgeon: Lucilla Lame, MD;  Location: [place];  Service: Endoscopy;  Laterality: N/A;   COLONOSCOPY WITH PROPOFOL N/A 01/19/2018   Procedure: COLONOSCOPY WITH PROPOFOL;  Surgeon: Lucilla Lame, MD;  Location: [place];  Service: Endoscopy;  Laterality: N/A;  Diabetic - oral meds   DG OPERATIVE LEFT HIP ([place] HX)     10/19   ELECTROMAGNETIC NAVIGATION BROCHOSCOPY Left 11/18/2018   Procedure: ELECTROMAGNETIC NAVIGATION BRONCHOSCOPY;  Surgeon: Tyler Pita, MD;  Location: [facility];  Service: Cardiopulmonary;  Laterality: Left;   FLEXIBLE BRONCHOSCOPY Bilateral 01/20/2019   Procedure: FLEXIBLE BRONCHOSCOPY;  Surgeon: Ottie Glazier, MD;  Location: [facility];  Service: Thoracic;  Laterality: Bilateral;   FLEXIBLE BRONCHOSCOPY Bilateral 01/22/2019   Procedure: FLEXIBLE BRONCHOSCOPY;  Surgeon: Ottie Glazier, MD;  Location: [facility];  Service: Thoracic;  Laterality: Bilateral;   FOOT SURGERY Left    Screws and plates   JOINT REPLACEMENT Left 12/2017   DR Rudene Christians Hip   KNEE SURGERY Left    X 2   LEG SURGERY     LUNG CANCER SURGERY     POLYPECTOMY N/A 02/19/2016   Procedure: POLYPECTOMY;  Surgeon: Lucilla Lame, MD;  Location: [place];  Service: Endoscopy;  Laterality: N/A;   POLYPECTOMY  01/19/2018   Procedure: POLYPECTOMY;  Surgeon: Lucilla Lame, MD;  Location: [place];  Service: Endoscopy;;   PORTACATH PLACEMENT N/A 11/11/2019   Procedure: INSERTION PORT-A-CATH;  Surgeon: Nestor Lewandowsky, MD;  Location: [facility];  Service: General;  Laterality: N/A;   THORACOTOMY Left 01/14/2019   Procedure:  THORACOTOMY MAJOR, LEFT;  Surgeon: Nestor Lewandowsky, MD;  Location: [facility];  Service: General;  Laterality: Left;   TOTAL HIP ARTHROPLASTY Left 12/02/2017   Procedure: TOTAL HIP ARTHROPLASTY ANTERIOR APPROACH;  Surgeon: Hessie Knows, MD;  Location: [facility];  Service: Orthopedics;  Laterality: Left;   VIDEO BRONCHOSCOPY Left 01/14/2019   Procedure: VIDEO BRONCHOSCOPY WITH FLUORO, LEFT;  Surgeon: Nestor Lewandowsky, MD;  Location: [facility];  Service: General;  Laterality: Left;   VIDEO BRONCHOSCOPY WITH ENDOBRONCHIAL NAVIGATION N/A 10/15/2019   Procedure: VIDEO BRONCHOSCOPY WITH ENDOBRONCHIAL NAVIGATION;  Surgeon: Ottie Glazier, MD;  Location: [facility];  Service: Thoracic;  Laterality: N/A;   VIDEO BRONCHOSCOPY WITH ENDOBRONCHIAL ULTRASOUND N/A 10/15/2019   Procedure: VIDEO BRONCHOSCOPY WITH ENDOBRONCHIAL ULTRASOUND;  Surgeon: Ottie Glazier, MD;  Location: [facility];  Service: Thoracic;  Laterality: N/A;    FAMILY HISTORY: Family History  Problem Relation Age of Onset   Cancer Father    Diabetes Sister    Thrombosis Sister     ADVANCED DIRECTIVES (Y/N):  N  HEALTH MAINTENANCE: Social History   Tobacco Use   Smoking status: Every Day    Packs/day: 0.25    Years: 35.00    Pack years: 8.75    Types: Cigarettes    Last attempt to quit: 01/14/2019    Years since quitting: 1.5   Smokeless tobacco: Never  Vaping Use   Vaping Use: Never used  Substance Use Topics   Alcohol use: No    Alcohol/week: 0.0 standard drinks   Drug use: Yes    Types: Oxycodone    Comment: prescribed     Colonoscopy:  PAP:  Bone density:  Lipid panel:  Allergies  Allergen Reactions   Acetaminophen Swelling   Aspirin Anaphylaxis   Epinephrine Anaphylaxis    Does not include albuterol   Novocain [Procaine] Anaphylaxis   Penicillins Anaphylaxis    Has patient had a PCN reaction causing immediate rash, facial/tongue/throat swelling, SOB or lightheadedness with hypotension: Yes Has patient had a PCN  reaction causing severe rash involving mucus membranes or skin necrosis: No Has patient had a PCN reaction that required hospitalization: Yes Has patient had a PCN reaction occurring within the last 10 years: No If all of the above answers are "NO", then may proceed with Cephalosporin use.    Strawberry Extract Anaphylaxis   Shellfish Allergy Hives and Nausea And Vomiting    Current Outpatient Medications  Medication Sig Dispense Refill   Accu-Chek FastClix Lancets MISC USE TO TEST BLOOD SUGAR 2X A DAY 100 each 12   albuterol (VENTOLIN HFA) 108 (90 Base) MCG/ACT inhaler Inhale into the lungs.     amLODipine (NORVASC) 5 MG tablet Take 1 tablet (5 mg total) by mouth 2 (two) times daily. 180 tablet 1   atorvastatin (LIPITOR) 10 MG tablet Take 1 tablet (10 mg total) by mouth daily at 6 PM. 90 tablet 1   BREZTRI AEROSPHERE 160-9-4.8 MCG/ACT AERO Inhale 2 puffs into the lungs 2 (two) times daily. Maintenance per pulmonology     diphenhydrAMINE (BENADRYL) 25 MG tablet Take 50 mg by mouth daily.     empagliflozin (JARDIANCE) 25  MG TABS tablet Take 1 tablet (25 mg total) by mouth daily before breakfast. 90 tablet 1   esomeprazole (NEXIUM) 40 MG capsule Take 1 capsule (40 mg total) by mouth daily. 90 capsule 3   glucose blood (ACCU-CHEK GUIDE) test strip USE TO TEST BLOOD SUGAR 2X A DAY 100 strip 3   hydrALAZINE (APRESOLINE) 100 MG tablet Take 1 tablet (100 mg total) by mouth 2 (two) times daily. 180 tablet 1   lidocaine (LIDODERM) 5 % Place 1 patch onto the skin daily. Remove & Discard patch within 12 hours or as directed by MD (Patient taking differently: Place 1 patch onto the skin daily as needed (pain). Remove & Discard patch within 12 hours or as directed by MD) 30 patch 12   lidocaine-prilocaine (EMLA) cream Apply 1 application topically as needed. Apply small amount to port site at least 1 hour prior to it being accessed, cover with plastic wrap 30 g 1   metFORMIN (GLUCOPHAGE) 500 MG tablet Take  2 tablets (1,000 mg total) by mouth 2 (two) times daily with a meal. 360 tablet 1   metoprolol tartrate (LOPRESSOR) 50 MG tablet Take 1 tablet (50 mg total) by mouth 2 (two) times daily. 180 tablet 1   montelukast (SINGULAIR) 10 MG tablet Take 10 mg by mouth daily.     morphine (MSIR) 15 MG tablet Take 1 tablet (15 mg total) by mouth every 6 (six) hours as needed for moderate pain or severe pain. Must last 30 days. 120 tablet 0   morphine (MSIR) 15 MG tablet Take 1 tablet (15 mg total) by mouth every 6 (six) hours as needed for moderate pain or severe pain. Must last 30 days. 120 tablet 0   Multiple Vitamin (MULTIVITAMIN WITH MINERALS) TABS tablet Take 1 tablet by mouth daily.     NONFORMULARY OR COMPOUNDED ITEM Apply 1-2 mLs topically 4 (four) times daily as needed. 10% Ketamine/2% Cyclobenzaprine/6% Gabapentin Cream 240 each PRN   nortriptyline (PAMELOR) 25 MG capsule Take 2 capsules (50 mg total) by mouth at bedtime. 180 capsule 1   Potassium Chloride ER 20 MEQ TBCR TAKE 1 TABLET BY MOUTH EVERY DAY 90 tablet 1   STIOLTO RESPIMAT 2.5-2.5 MCG/ACT AERS Inhale 2 puffs into the lungs 4 (four) times daily as needed. Prn per pulmonology notes     SUMAtriptan (IMITREX) 50 MG tablet Take 1 tablet (50 mg total) by mouth every 2 (two) hours as needed for migraine. Take 1 tab at onset of migraine. May repeat in 2 hours if headache persists or recurs. 10 tablet 12   No current facility-administered medications for this visit.   Facility-Administered Medications Ordered in Other Visits  Medication Dose Route Frequency Provider Last Rate Last Admin   0.9 %  sodium chloride infusion    Continuous PRN Dionne Bucy, CRNA   New Bag at 01/22/19 0705   sodium chloride flush (NS) 0.9 % injection 10 mL  10 mL Intracatheter PRN [name], MD   10 mL at 03/01/20 0932    OBJECTIVE: There were no vitals filed for this visit.    There is no height or weight on file to calculate BMI.    ECOG FS:0 -  Asymptomatic  Physical Exam Constitutional:      Appearance: Normal appearance.  HENT:     Head: Normocephalic and atraumatic.  Eyes:     Pupils: Pupils are equal, round, and reactive to light.  Cardiovascular:     Rate and Rhythm: Normal  rate and regular rhythm.     Heart sounds: Normal heart sounds. No murmur heard. Pulmonary:     Effort: Pulmonary effort is normal.     Breath sounds: Normal breath sounds. No wheezing.  Abdominal:     General: Bowel sounds are normal. There is no distension.     Palpations: Abdomen is soft.     Tenderness: There is no abdominal tenderness.  Musculoskeletal:        General: Normal range of motion.     Cervical back: Normal range of motion.  Skin:    General: Skin is warm and dry.     Findings: No rash.  Neurological:     Mental Status: He is alert and oriented to person, place, and time.  Psychiatric:        Judgment: Judgment normal.     LAB RESULTS:  Lab Results  Component Value Date   NA 137 07/19/2020   K 3.3 (L) 07/19/2020   CL 103 07/19/2020   CO2 27 07/19/2020   GLUCOSE 175 (H) 07/19/2020   BUN 8 07/19/2020   CREATININE 0.76 07/19/2020   CALCIUM 9.0 07/19/2020   PROT 7.9 07/19/2020   ALBUMIN 4.3 07/19/2020   AST 36 07/19/2020   ALT 39 07/19/2020   ALKPHOS 112 07/19/2020   BILITOT 0.7 07/19/2020   GFRNONAA >60 07/19/2020   GFRAA >60 12/01/2019    Lab Results  Component Value Date   WBC 5.2 07/19/2020   NEUTROABS 3.0 07/19/2020   HGB 13.3 07/19/2020   HCT 40.4 07/19/2020   MCV 91.4 07/19/2020   PLT 163 07/19/2020     STUDIES: No results found.  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  PLAN:    Clinical stage IIIa squamous cell carcinoma of the left lower lobe lung:  Patient underwent lung resection on January 14, 2019.   He had a complicated postoperative course.  CT scan results from August 14, 2019 reviewed independently with a suspicious subcarinal lymph  node measuring 1.4 cm.   Follow-up PET scan on August 23, 2019 revealed hypermetabolism in lymph node highly suspicious for recurrence.   Biopsy confirmed recurrence increasing patient's stage from IA up to IIIA.  Completed concurrent XRT and weekly carboplatinum and Taxol on January 05, 2020.   Initiated 1 year of maintenance immunotherapy using durvalumab on January 19, 2020.   Repeat CT scan from June 19, 2020 reviewed independently and report as above with resolution of mediastinal lymphadenopathy and no new or progressive findings.   He is scheduled for a repeat CT chest on 12/14/2020. Proceed with cycle 16 of treatment today.    Pain:  Chronic and unchanged.   Unrelated to his malignancy.   Continue monitoring and treatment per pain clinic.  Nausea:  Patient does not complain of this today.   Previously, patient asked if it were okay to take CBD Gummies for nausea.   From an oncology standpoint this would be okay, but patient did state concern that it would show a positive urine test.   He was instructed to let pain clinic know that he was taking this for oncologic purposes.  Hypokalemia:  Labs today show potassium of 3.1. I have asked him to increase potassium in his diet and make sure he is taking his potassium supplements daily. Chronic and unchanged.   Monitor.  Disposition: Return to clinic in 2 weeks for treatment only (cycle 17) and then in 4 weeks for further evaluation  and consideration of cycle 18.  Greater than 50% was spent in counseling and coordination of care with this patient including but not limited to discussion of the relevant topics above (See A&P) including, but not limited to diagnosis and management of acute and chronic medical conditions.   Patient expressed understanding and was in agreement with this plan. He also understands that He can call clinic at any time with any questions, concerns, or complaints.   Cancer Staging Squamous cell carcinoma of  left lung [facility]) Staging form: Lung, AJCC 8th Edition - Clinical stage from 02/12/2019: Stage IIIA (cT1b, cN2, cM0) - Signed by [name], MD on 11/01/2019   Jacquelin Hawking, NP   08/16/2020 8:55 AM

## 2020-08-16 NOTE — Patient Instructions (Signed)
[facility]COL[facility]   Discharge Instructions: Thank you for choosing [facility] to provide your oncology and hematology care.  If you have a lab appointment with the [facility], please go directly to the [facility] and check in at the registration area.  Wear comfortable clothing and clothing appropriate for easy access to any Portacath or PICC line.   We strive to give you quality time with your provider. You may need to reschedule your appointment if you arrive late (15 or more minutes).  Arriving late affects you and other patients whose appointments are after yours.  Also, if you miss three or more appointments without notifying the office, you may be dismissed from the clinic at the provider's discretion.      For prescription refill requests, have your pharmacy contact our office and allow 72 hours for refills to be completed.    Today you received the following chemotherapy and/or immunotherapy agents Durvalumab      To help prevent nausea and vomiting after your treatment, we encourage you to take your nausea medication as directed.  BELOW ARE SYMPTOMS THAT SHOULD BE REPORTED IMMEDIATELY: *FEVER GREATER THAN 100.4 F (38 C) OR HIGHER *CHILLS OR SWEATING *NAUSEA AND VOMITING THAT IS NOT CONTROLLED WITH YOUR NAUSEA MEDICATION *UNUSUAL SHORTNESS OF BREATH *UNUSUAL BRUISING OR BLEEDING *URINARY PROBLEMS (pain or burning when urinating, or frequent urination) *BOWEL PROBLEMS (unusual diarrhea, constipation, pain near the anus) TENDERNESS IN MOUTH AND THROAT WITH OR WITHOUT PRESENCE OF ULCERS (sore throat, sores in mouth, or a toothache) UNUSUAL RASH, SWELLING OR PAIN  UNUSUAL VAGINAL DISCHARGE OR ITCHING   Items with * indicate a potential emergency and should be followed up as soon as possible or go to the Emergency Department if any problems should occur.  Please show the CHEMOTHERAPY ALERT CARD or IMMUNOTHERAPY ALERT CARD at  check-in to the Emergency Department and triage nurse.  Should you have questions after your visit or need to cancel or reschedule your appointment, please contact [facility]  [phone] and follow the prompts.  Office hours are 8:00 a.m. to 4:30 p.m. Monday - Friday. Please note that voicemails left after 4:00 p.m. may not be returned until the following business day.  We are closed weekends and major holidays. You have access to a nurse at all times for urgent questions. Please call the main number to the clinic [phone] and follow the prompts.  For any non-urgent questions, you may also contact your provider using MyChart. We now offer e-Visits for anyone 58 and older to request care online for non-urgent symptoms. For details visit [email or web address].   Also download the MyChart app! Go to the app store, search "MyChart", open the app, select [facility], and log in with your MyChart username and password.  Due to Covid, a mask is required upon entering the hospital/clinic. If you do not have a mask, one will be given to you upon arrival. For doctor visits, patients may have 1 support person aged 83 or older with them. For treatment visits, patients cannot have anyone with them due to current Covid guidelines and our immunocompromised population.

## 2020-08-17 LAB — T4: T4, Total: 7.9 ug/dL (ref 4.5–12.0)

## 2020-08-18 ENCOUNTER — Ambulatory Visit (INDEPENDENT_AMBULATORY_CARE_PROVIDER_SITE_OTHER): Payer: Medicare Other

## 2020-08-18 VITALS — BP 128/99 | HR 68 | Ht 70.0 in | Wt 219.0 lb

## 2020-08-18 DIAGNOSIS — Z0000 Encounter for general adult medical examination without abnormal findings: Secondary | ICD-10-CM | POA: Diagnosis not present

## 2020-08-18 NOTE — Patient Instructions (Signed)
Kristopher [name] , Thank you for taking time to come for your Medicare Wellness Visit. I appreciate your ongoing commitment to your health goals. Please review the following plan we discussed and let me know if I can assist you in the future.   Screening recommendations/referrals: Colonoscopy: completed 01/19/2018 Recommended yearly ophthalmology/optometry visit for glaucoma screening and checkup Recommended yearly dental visit for hygiene and checkup  Vaccinations: Influenza vaccine: decline Pneumococcal vaccine: decline Tdap vaccine: completed 06/03/2017, due 06/04/2027 Shingles vaccine: discussed   Covid-19: decline  Advanced directives: Please bring a copy of your POA (Power of Attorney) and/or Living Will to your next appointment.   Conditions/risks identified: smoking  Next appointment: Follow up in one year for your annual wellness visit   Preventive Care 40-64 Years, Male Preventive care refers to lifestyle choices and visits with your health care provider that can promote health and wellness. What does preventive care include? A yearly physical exam. This is also called an annual well check. Dental exams once or twice a year. Routine eye exams. Ask your health care provider how often you should have your eyes checked. Personal lifestyle choices, including: Daily care of your teeth and gums. Regular physical activity. Eating a healthy diet. Avoiding tobacco and drug use. Limiting alcohol use. Practicing safe sex. Taking low-dose aspirin every day starting at age 81. What happens during an annual well check? The services and screenings done by your health care provider during your annual well check will depend on your age, overall health, lifestyle risk factors, and family history of disease. Counseling  Your health care provider may ask you questions about your: Alcohol use. Tobacco use. Drug use. Emotional well-being. Home and relationship well-being. Sexual  activity. Eating habits. Work and work Statistician. Screening  You may have the following tests or measurements: Height, weight, and BMI. Blood pressure. Lipid and cholesterol levels. These may be checked every 5 years, or more frequently if you are over 28 years old. Skin check. Lung cancer screening. You may have this screening every year starting at age 78 if you have a 30-pack-year history of smoking and currently smoke or have quit within the past 15 years. Fecal occult blood test (FOBT) of the stool. You may have this test every year starting at age 52. Flexible sigmoidoscopy or colonoscopy. You may have a sigmoidoscopy every 5 years or a colonoscopy every 10 years starting at age 76. Prostate cancer screening. Recommendations will vary depending on your family history and other risks. Hepatitis C blood test. Hepatitis B blood test. Sexually transmitted disease (STD) testing. Diabetes screening. This is done by checking your blood sugar (glucose) after you have not eaten for a while (fasting). You may have this done every 1-3 years. Discuss your test results, treatment options, and if necessary, the need for more tests with your health care provider. Vaccines  Your health care provider may recommend certain vaccines, such as: Influenza vaccine. This is recommended every year. Tetanus, diphtheria, and acellular pertussis (Tdap, Td) vaccine. You may need a Td booster every 10 years. Zoster vaccine. You may need this after age 49. Pneumococcal 13-valent conjugate (PCV13) vaccine. You may need this if you have certain conditions and have not been vaccinated. Pneumococcal polysaccharide (PPSV23) vaccine. You may need one or two doses if you smoke cigarettes or if you have certain conditions. Talk to your health care provider about which screenings and vaccines you need and how often you need them. This information is not intended to replace advice given  to you by your health care provider.  Make sure you discuss any questions you have with your health care provider. Document Released: 03/17/2015 Document Revised: 11/08/2015 Document Reviewed: 12/20/2014 Elsevier Interactive Patient Education  2017 [place] Prevention in the Home Falls can cause injuries. They can happen to people of all ages. There are many things you can do to make your home safe and to help prevent falls. What can I do on the outside of my home? Regularly fix the edges of walkways and driveways and fix any cracks. Remove anything that might make you trip as you walk through a door, such as a raised step or threshold. Trim any bushes or trees on the path to your home. Use bright outdoor lighting. Clear any walking paths of anything that might make someone trip, such as rocks or tools. Regularly check to see if handrails are loose or broken. Make sure that both sides of any steps have handrails. Any raised decks and porches should have guardrails on the edges. Have any leaves, snow, or ice cleared regularly. Use sand or salt on walking paths during winter. Clean up any spills in your garage right away. This includes oil or grease spills. What can I do in the bathroom? Use night lights. Install grab bars by the toilet and in the tub and shower. Do not use towel bars as grab bars. Use non-skid mats or decals in the tub or shower. If you need to sit down in the shower, use a plastic, non-slip stool. Keep the floor dry. Clean up any water that spills on the floor as soon as it happens. Remove soap buildup in the tub or shower regularly. Attach bath mats securely with double-sided non-slip rug tape. Do not have throw rugs and other things on the floor that can make you trip. What can I do in the bedroom? Use night lights. Make sure that you have a light by your bed that is easy to reach. Do not use any sheets or blankets that are too big for your bed. They should not hang down onto the floor. Have a  firm chair that has side arms. You can use this for support while you get dressed. Do not have throw rugs and other things on the floor that can make you trip. What can I do in the kitchen? Clean up any spills right away. Avoid walking on wet floors. Keep items that you use a lot in easy-to-reach places. If you need to reach something above you, use a strong step stool that has a grab bar. Keep electrical cords out of the way. Do not use floor polish or wax that makes floors slippery. If you must use wax, use non-skid floor wax. Do not have throw rugs and other things on the floor that can make you trip. What can I do with my stairs? Do not leave any items on the stairs. Make sure that there are handrails on both sides of the stairs and use them. Fix handrails that are broken or loose. Make sure that handrails are as long as the stairways. Check any carpeting to make sure that it is firmly attached to the stairs. Fix any carpet that is loose or worn. Avoid having throw rugs at the top or bottom of the stairs. If you do have throw rugs, attach them to the floor with carpet tape. Make sure that you have a light switch at the top of the stairs and the bottom of the  stairs. If you do not have them, ask someone to add them for you. What else can I do to help prevent falls? Wear shoes that: Do not have high heels. Have rubber bottoms. Are comfortable and fit you well. Are closed at the toe. Do not wear sandals. If you use a stepladder: Make sure that it is fully opened. Do not climb a closed stepladder. Make sure that both sides of the stepladder are locked into place. Ask someone to hold it for you, if possible. Clearly mark and make sure that you can see: Any grab bars or handrails. First and last steps. Where the edge of each step is. Use tools that help you move around (mobility aids) if they are needed. These include: Canes. Walkers. Scooters. Crutches. Turn on the lights when you  go into a dark area. Replace any light bulbs as soon as they burn out. Set up your furniture so you have a clear path. Avoid moving your furniture around. If any of your floors are uneven, fix them. If there are any pets around you, be aware of where they are. Review your medicines with your doctor. Some medicines can make you feel dizzy. This can increase your chance of falling. Ask your doctor what other things that you can do to help prevent falls. This information is not intended to replace advice given to you by your health care provider. Make sure you discuss any questions you have with your health care provider. Document Released: 12/15/2008 Document Revised: 07/27/2015 Document Reviewed: 03/25/2014 Elsevier Interactive Patient Education  2017 [employer].

## 2020-08-18 NOTE — Addendum Note (Signed)
Addended by: Kellie Simmering on: 08/18/2020 08:41 AM   Modules accepted: Orders

## 2020-08-18 NOTE — Progress Notes (Signed)
I connected with Kristopher [name] today by telephone and verified that I am speaking with the correct person using two identifiers. Location patient: home Location provider: work Persons participating in the virtual visit: Alois Colgan, Glenna Durand LPN.   I discussed the limitations, risks, security and privacy concerns of performing an evaluation and management service by telephone and the availability of in person appointments. I also discussed with the patient that there may be a patient responsible charge related to this service. The patient expressed understanding and verbally consented to this telephonic visit.    Interactive audio and video telecommunications were attempted between this provider and patient, however failed, due to patient having technical difficulties OR patient did not have access to video capability.  We continued and completed visit with audio only.     Vital signs may be patient reported or missing.  Subjective:   Kristopher [name] is a 60 y.o. male who presents for Medicare Annual/Subsequent preventive examination.  Review of Systems     Cardiac Risk Factors include: advanced age (>17mn, >>97women);diabetes mellitus;dyslipidemia;hypertension;male gender;obesity (BMI >30kg/m2);sedentary lifestyle;smoking/ tobacco exposure     Objective:    Today's Vitals   08/18/20 0811  BP: (!) 128/99  Pulse: 68  Weight: 219 lb (99.3 kg)  Height: _0  (1.778 m)   Body mass index is 31.42 kg/m.  Advanced Directives 08/18/2020 08/16/2020 06/21/2020 05/24/2020 04/26/2020 03/01/2020 02/23/2020  Does Patient Have a Medical Advance Directive? Yes Yes Yes No No Yes Yes  Type of AParamedicof AMontcalmLiving will [place]Living will HCatarinaLiving will - - HGarden PrairieLiving will [place]Living will  Does patient want to make changes to medical advance directive? - - - - -  - -  Copy of HOel[place]sin Chart? No - copy requested - - - - - -  Would patient like information on creating a medical advance directive? - - - - - - -    Current Medications (verified) Outpatient Encounter Medications as of 08/18/2020  Medication Sig   Accu-Chek FastClix Lancets MISC USE TO TEST BLOOD SUGAR 2X A DAY   albuterol (VENTOLIN HFA) 108 (90 Base) MCG/ACT inhaler Inhale into the lungs.   amLODipine (NORVASC) 5 MG tablet Take 1 tablet (5 mg total) by mouth 2 (two) times daily.   atorvastatin (LIPITOR) 10 MG tablet Take 1 tablet (10 mg total) by mouth daily at 6 PM.   BREZTRI AEROSPHERE 160-9-4.8 MCG/ACT AERO Inhale 2 puffs into the lungs 2 (two) times daily. Maintenance per pulmonology   diphenhydrAMINE (BENADRYL) 25 MG tablet Take 50 mg by mouth daily.   empagliflozin (JARDIANCE) 25 MG TABS tablet Take 1 tablet (25 mg total) by mouth daily before breakfast.   esomeprazole (NEXIUM) 40 MG capsule Take 1 capsule (40 mg total) by mouth daily.   glucose blood (ACCU-CHEK GUIDE) test strip USE TO TEST BLOOD SUGAR 2X A DAY   hydrALAZINE (APRESOLINE) 100 MG tablet Take 1 tablet (100 mg total) by mouth 2 (two) times daily.   lidocaine (LIDODERM) 5 % Place 1 patch onto the skin daily. Remove & Discard patch within 12 hours or as directed by MD (Patient taking differently: Place 1 patch onto the skin daily as needed (pain). Remove & Discard patch within 12 hours or as directed by MD)   lidocaine-prilocaine (EMLA) cream Apply 1 application topically as needed. Apply small amount to port site at least 1 hour prior  to it being accessed, cover with plastic wrap   metFORMIN (GLUCOPHAGE) 500 MG tablet Take 2 tablets (1,000 mg total) by mouth 2 (two) times daily with a meal.   metoprolol tartrate (LOPRESSOR) 50 MG tablet Take 1 tablet (50 mg total) by mouth 2 (two) times daily.   montelukast (SINGULAIR) 10 MG tablet Take 10 mg by mouth daily.   morphine (MSIR) 15 MG tablet Take 1  tablet (15 mg total) by mouth every 6 (six) hours as needed for moderate pain or severe pain. Must last 30 days.   Multiple Vitamin (MULTIVITAMIN WITH MINERALS) TABS tablet Take 1 tablet by mouth daily.   NONFORMULARY OR COMPOUNDED ITEM Apply 1-2 mLs topically 4 (four) times daily as needed. 10% Ketamine/2% Cyclobenzaprine/6% Gabapentin Cream   nortriptyline (PAMELOR) 25 MG capsule Take 2 capsules (50 mg total) by mouth at bedtime.   Potassium Chloride ER 20 MEQ TBCR TAKE 1 TABLET BY MOUTH EVERY DAY   STIOLTO RESPIMAT 2.5-2.5 MCG/ACT AERS Inhale 2 puffs into the lungs 4 (four) times daily as needed. Prn per pulmonology notes   SUMAtriptan (IMITREX) 50 MG tablet Take 1 tablet (50 mg total) by mouth every 2 (two) hours as needed for migraine. Take 1 tab at onset of migraine. May repeat in 2 hours if headache persists or recurs.   morphine (MSIR) 15 MG tablet Take 1 tablet (15 mg total) by mouth every 6 (six) hours as needed for moderate pain or severe pain. Must last 30 days.   Facility-Administered Encounter Medications as of 08/18/2020  Medication   0.9 %  sodium chloride infusion   sodium chloride flush (NS) 0.9 % injection 10 mL    Allergies (verified) Acetaminophen, Aspirin, Epinephrine, Novocain [procaine], Penicillins, Strawberry extract, and Shellfish allergy   History: Past Medical History:  Diagnosis Date   Allergy    Arthritis    left foot   Benign hypertensive kidney disease    Chronic back pain    Four rods in back   Diabetes mellitus, type 2 (HCC)    Dyspnea    GERD (gastroesophageal reflux disease)    Hypertension    Malignant neoplasm of lung (HCC)    Migraines    daily   Past Surgical History:  Procedure Laterality Date   APPENDECTOMY     BACK SURGERY     COLONOSCOPY WITH PROPOFOL N/A 02/19/2016   Procedure: COLONOSCOPY WITH PROPOFOL;  Surgeon: Lucilla Lame, MD;  Location: [place];  Service: Endoscopy;  Laterality: N/A;   COLONOSCOPY WITH PROPOFOL N/A  01/19/2018   Procedure: COLONOSCOPY WITH PROPOFOL;  Surgeon: Lucilla Lame, MD;  Location: [place];  Service: Endoscopy;  Laterality: N/A;  Diabetic - oral meds   DG OPERATIVE LEFT HIP ([place] HX)     10/19   ELECTROMAGNETIC NAVIGATION BROCHOSCOPY Left 11/18/2018   Procedure: ELECTROMAGNETIC NAVIGATION BRONCHOSCOPY;  Surgeon: Tyler Pita, MD;  Location: [facility];  Service: Cardiopulmonary;  Laterality: Left;   FLEXIBLE BRONCHOSCOPY Bilateral 01/20/2019   Procedure: FLEXIBLE BRONCHOSCOPY;  Surgeon: Ottie Glazier, MD;  Location: [facility];  Service: Thoracic;  Laterality: Bilateral;   FLEXIBLE BRONCHOSCOPY Bilateral 01/22/2019   Procedure: FLEXIBLE BRONCHOSCOPY;  Surgeon: Ottie Glazier, MD;  Location: [facility];  Service: Thoracic;  Laterality: Bilateral;   FOOT SURGERY Left    Screws and plates   JOINT REPLACEMENT Left 12/2017   DR Rudene Christians Hip   KNEE SURGERY Left    X 2   LEG SURGERY     LUNG CANCER  SURGERY     POLYPECTOMY N/A 02/19/2016   Procedure: POLYPECTOMY;  Surgeon: Lucilla Lame, MD;  Location: [place];  Service: Endoscopy;  Laterality: N/A;   POLYPECTOMY  01/19/2018   Procedure: POLYPECTOMY;  Surgeon: Lucilla Lame, MD;  Location: [place];  Service: Endoscopy;;   PORTACATH PLACEMENT N/A 11/11/2019   Procedure: INSERTION PORT-A-CATH;  Surgeon: Nestor Lewandowsky, MD;  Location: [facility];  Service: General;  Laterality: N/A;   THORACOTOMY Left 01/14/2019   Procedure: THORACOTOMY MAJOR, LEFT;  Surgeon: Nestor Lewandowsky, MD;  Location: [facility];  Service: General;  Laterality: Left;   TOTAL HIP ARTHROPLASTY Left 12/02/2017   Procedure: TOTAL HIP ARTHROPLASTY ANTERIOR APPROACH;  Surgeon: Hessie Knows, MD;  Location: [facility];  Service: Orthopedics;  Laterality: Left;   VIDEO BRONCHOSCOPY Left 01/14/2019   Procedure: VIDEO BRONCHOSCOPY WITH FLUORO, LEFT;  Surgeon: Nestor Lewandowsky, MD;  Location: [facility];  Service: General;  Laterality: Left;   VIDEO  BRONCHOSCOPY WITH ENDOBRONCHIAL NAVIGATION N/A 10/15/2019   Procedure: VIDEO BRONCHOSCOPY WITH ENDOBRONCHIAL NAVIGATION;  Surgeon: Ottie Glazier, MD;  Location: [facility];  Service: Thoracic;  Laterality: N/A;   VIDEO BRONCHOSCOPY WITH ENDOBRONCHIAL ULTRASOUND N/A 10/15/2019   Procedure: VIDEO BRONCHOSCOPY WITH ENDOBRONCHIAL ULTRASOUND;  Surgeon: Ottie Glazier, MD;  Location: [facility];  Service: Thoracic;  Laterality: N/A;   Family History  Problem Relation Age of Onset   Cancer Father    Diabetes Sister    Thrombosis Sister    Social History   Socioeconomic History   Marital status: Significant Other    Spouse name: Not on file   Number of children: Not on file   Years of education: Not on file   Highest education level: Not on file  Occupational History   Occupation: disability  Tobacco Use   Smoking status: Every Day    Packs/day: 0.25    Years: 35.00    Pack years: 8.75    Types: Cigarettes    Last attempt to quit: 01/14/2019    Years since quitting: 1.5   Smokeless tobacco: Never  Vaping Use   Vaping Use: Never used  Substance and Sexual Activity   Alcohol use: No    Alcohol/week: 0.0 standard drinks   Drug use: Yes    Types: Morphine    Comment: prescribed   Sexual activity: Yes  Other Topics Concern   Not on file  Social History Narrative   Not on file   Social Determinants of Health   Financial Resource Strain: Medium Risk   Difficulty of Paying Living Expenses: Somewhat hard  Food Insecurity: No Food Insecurity   Worried About Charity fundraiser in the Last Year: Never true   Ran Out of Food in the Last Year: Never true  Transportation Needs: No Transportation Needs   Lack of Transportation (Medical): No   Lack of Transportation (Non-Medical): No  Physical Activity: Inactive   Days of Exercise per Week: 0 days   Minutes of Exercise per Session: 0 min  Stress: No Stress Concern Present   Feeling of Stress : Not at all  Social Connections: Not on  file    Tobacco Counseling Ready to quit: Yes Counseling given: Not Answered   Clinical Intake:  Pre-visit preparation completed: Yes  Pain : No/denies pain     Nutritional Status: BMI > 30  Obese Nutritional Risks: None Diabetes: Yes  How often do you need to have someone help you when you read instructions, pamphlets, or other written materials from  your doctor or pharmacy?: 1 - Never What is the last grade level you completed in school?: 11th grade  Diabetic? Yes Nutrition Risk Assessment:  Has the patient had any N/V/D within the last 2 months?  No  Does the patient have any non-healing wounds?  No  Has the patient had any unintentional weight loss or weight gain?  No   Diabetes:  Is the patient diabetic?  Yes  If diabetic, was a CBG obtained today?  No  Did the patient bring in their glucometer from home?  No  How often do you monitor your CBG's? Twice daily.   Financial Strains and Diabetes Management:  Are you having any financial strains with the device, your supplies or your medication? No .  Does the patient want to be seen by Chronic Care Management for management of their diabetes?  No  Would the patient like to be referred to a Nutritionist or for Diabetic Management?  No   Diabetic Exams:  Diabetic Eye Exam: Overdue for diabetic eye exam. Pt has been advised about the importance in completing this exam. Patient advised to call and schedule an eye exam. Diabetic Foot Exam: Completed 08/25/2019   Interpreter Needed?: No  Information entered by :: NAllen LPN   Activities of Daily Living In your present state of health, do you have any difficulty performing the following activities: 08/18/2020  Hearing? N  Vision? N  Comment with migraines  Difficulty concentrating or making decisions? Y  Walking or climbing stairs? N  Dressing or bathing? N  Doing errands, shopping? N  Preparing Food and eating ? N  Using the Toilet? N  In the past six months,  have you accidently leaked urine? N  Do you have problems with loss of bowel control? N  Managing your Medications? Y  Comment wife sets them up  Managing your Finances? N  Housekeeping or managing your Housekeeping? N  Some recent data might be hidden    Patient Care Team: [name], DO as PCP - General (Family Medicine) Gavin Pound, CMA (Inactive) as Certified Medical Assistant Telford Nab, RN as Registered Nurse Vanita Ingles, RN as Case Manager (General Practice) [name], MD as Referring Physician (Pain Medicine) [name], MD as Consulting Physician (Oncology)  Indicate any recent Medical Services you may have received from other than [facility] providers in the past year (date may be approximate).     Assessment:   This is a routine wellness examination for Bradely.  Hearing/Vision screen Vision Screening - Comments:: No regular eye [place]  Dietary issues and exercise activities discussed: Current Exercise Habits: The patient does not participate in regular exercise at present   Goals Addressed             This Visit's Progress    Patient Stated       08/18/2020, get over cancer        Depression Screen PHQ 2/9 Scores 08/18/2020 05/22/2020 02/23/2020 10/13/2019 08/11/2019 10/06/2018 08/12/2018  PHQ - 2 Score 0 0 0 0 0 0 0  PHQ- 9 Score - - - - - - -  Exception Documentation - - - - - - -    Fall Risk Fall Risk  08/18/2020 05/22/2020 02/23/2020 12/22/2019 10/13/2019  Falls in the past year? 0 0 0 0 0  Comment - - - - Pt is stumbling a lot but not on the ground  Number falls in past yr: - - - - 0  Comment - - - - -  Injury with Fall? - - - - 0  Comment - - - - -  Risk Factor Category  - - - - -  Risk for fall due to : Medication side effect - - - -  Risk for fall due to: Comment - - - - -  Follow up Falls evaluation completed;Education provided;Falls prevention discussed - - - -    FALL RISK PREVENTION  PERTAINING TO THE HOME:  Any stairs in or around the home? Yes  If so, are there any without handrails? No  Home free of loose throw rugs in walkways, pet beds, electrical cords, etc? Yes  Adequate lighting in your home to reduce risk of falls? Yes   ASSISTIVE DEVICES UTILIZED TO PREVENT FALLS:  Life alert? No  Use of a cane, walker or w/c? No  Grab bars in the bathroom? No  Shower chair or bench in shower? Yes  Elevated toilet seat or a handicapped toilet? Yes   TIMED UP AND GO:  Was the test performed? No .      Cognitive Function:     6CIT Screen 08/18/2020 08/04/2017 06/24/2016  What Year? 0 points 0 points 0 points  What month? 0 points 0 points 0 points  What time? 0 points 0 points 0 points  Count back from 20 0 points 0 points 0 points  Months in reverse 4 points 0 points 4 points  Repeat phrase 6 points 2 points 4 points  Total Score _0 Immunizations Immunization History  Administered Date(s) Administered   Pneumococcal Polysaccharide-23 06/24/2016   Tdap 03/04/2013, 06/03/2017    TDAP status: Up to date  Flu Vaccine status: Declined, Education has been provided regarding the importance of this vaccine but patient still declined. Advised may receive this vaccine at local pharmacy or Health Dept. Aware to provide a copy of the vaccination record if obtained from local pharmacy or Health Dept. Verbalized acceptance and understanding.  Pneumococcal vaccine status: Declined,  Education has been provided regarding the importance of this vaccine but patient still declined. Advised may receive this vaccine at local pharmacy or Health Dept. Aware to provide a copy of the vaccination record if obtained from local pharmacy or Health Dept. Verbalized acceptance and understanding.   Covid-19 vaccine status: Declined, Education has been provided regarding the importance of this vaccine but patient still declined. Advised may receive this vaccine at local pharmacy or  Health Dept.or vaccine clinic. Aware to provide a copy of the vaccination record if obtained from local pharmacy or Health Dept. Verbalized acceptance and understanding.  Qualifies for Shingles Vaccine? Yes   Zostavax completed No   Shingrix Completed?: No.    Education has been provided regarding the importance of this vaccine. Patient has been advised to call insurance company to determine out of pocket expense if they have not yet received this vaccine. Advised may also receive vaccine at local pharmacy or Health Dept. Verbalized acceptance and understanding.  Screening Tests Health Maintenance  Topic Date Due   COVID-19 Vaccine (1) Never done   Pneumococcal Vaccine 59-44 Years old (1 - PCV) Never done   Zoster Vaccines- Shingrix (1 of 2) Never done   OPHTHALMOLOGY EXAM  02/18/2019   FOOT EXAM  08/24/2020   INFLUENZA VACCINE  10/02/2020   HEMOGLOBIN A1C  11/29/2020   COLONOSCOPY (Pts 45-67yr Insurance coverage will need to be confirmed)  01/19/2021   TETANUS/TDAP  06/04/2027   PNEUMOCOCCAL  POLYSACCHARIDE VACCINE AGE 69-64 HIGH RISK  Completed   Hepatitis C Screening  Completed   HIV Screening  Completed   HPV VACCINES  Aged Out    Health Maintenance  Health Maintenance Due  Topic Date Due   COVID-19 Vaccine (1) Never done   Pneumococcal Vaccine 21-18 Years old (1 - PCV) Never done   Zoster Vaccines- Shingrix (1 of 2) Never done   OPHTHALMOLOGY EXAM  02/18/2019    Colorectal cancer screening: Type of screening: Colonoscopy. Completed 01/19/2018. Repeat every 3 years  Lung Cancer Screening: (Low Dose CT Chest recommended if Age 23-80 years, 30 pack-year currently smoking OR have quit w/in 15years.) does not qualify.   Lung Cancer Screening Referral: no  Additional Screening:  Hepatitis C Screening: does qualify; Completed 06/24/2016  Vision Screening: Recommended annual ophthalmology exams for early detection of glaucoma and other disorders of the eye. Is the patient up to  date with their annual eye exam?  No  Who is the provider or what is the name of the office in which the patient attends annual eye exams? [facility] If pt is not established with a provider, would they like to be referred to a provider to establish care? No .   Dental Screening: Recommended annual dental exams for proper oral hygiene  Community Resource Referral / Chronic Care Management: CRR required this visit?  No   CCM required this visit?  No      Plan:     I have personally reviewed and noted the following in the patient's chart:   Medical and social history Use of alcohol, tobacco or illicit drugs  Current medications and supplements including opioid prescriptions. Patient is currently taking opioid prescriptions. Information provided to patient regarding non-opioid alternatives. Patient advised to discuss non-opioid treatment plan with their provider. Functional ability and status Nutritional status Physical activity Advanced directives List of other physicians Hospitalizations, surgeries, and ER visits in previous 12 months Vitals Screenings to include cognitive, depression, and falls Referrals and appointments  In addition, I have reviewed and discussed with patient certain preventive protocols, quality metrics, and best practice recommendations. A written personalized care plan for preventive services as well as general preventive health recommendations were provided to patient.     Kellie Simmering, LPN   [id number]/[id number]   Nurse Notes:

## 2020-08-20 DIAGNOSIS — Z79891 Long term (current) use of opiate analgesic: Secondary | ICD-10-CM | POA: Insufficient documentation

## 2020-08-20 NOTE — Progress Notes (Signed)
PROVIDER NOTE: Information contained herein reflects review and annotations entered in association with encounter. Interpretation of such information and data should be left to medically-trained personnel. Information provided to patient can be located elsewhere in the medical record under "Patient Instructions". Document created using STT-dictation technology, any transcriptional errors that may result from process are unintentional.    Patient: Kristopher [name]  Service Category: E/M  Provider: Gaspar Cola, MD  DOB: Apr 02, 1960  DOS: 08/21/2020  Specialty: Interventional Pain Management  MRN: [id number]  Setting: Ambulatory outpatient  PCP: [name], DO  Type: Established Patient    Referring Provider: [name], DO  Location: Office  Delivery: Face-to-face     HPI  [name], a 60 y.o. year old male, is here today because of his Chronic pain syndrome [G89.4]. [name] primary complain today is Back Pain (Lumbar left ) Last encounter: My last encounter with him was on 05/22/2020. Pertinent problems: 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); Chronic neck pain (posterior midline) (Bilateral) (L>R); Cervical spondylosis; Chronic cervical radicular pain (Third area of Pain) (Bilateral) (C5/C6 dermatome) (L>R); Cervical spinal stenosis (C4-5); Cervical foraminal stenosis (Bilateral C5-6); Retrolisthesis of L5-S1; Cervical disc herniation (C4-5 and C5-6); Lumbar disc herniation (L5-S1); Chronic sacroiliac joint pain (Bilateral) (L>R); Chronic hip pain (Left); Lumbar facet syndrome (Location of Primary Source of Pain) (Bilateral) (L>R); Chronic lower extremity pain (Secondary area of Pain) (Left); Greater occipital neuralgia (Right); Chronic pain syndrome; Migraine;  Musculoskeletal pain, chronic; Left-sided weakness; Hip arthritis; S/P hip replacement; Squamous cell carcinoma of left lung (HCC); and Chronic upper extremity pain (Third area of Pain) (Bilateral) (L>R) on their pertinent problem list. Pain Assessment: Severity of   is reported as a 3 /10. Location: Back Left/down the left leg to approx the knee. Onset: More than a month ago. Quality: Discomfort, Constant, Stabbing. Timing: Constant. Modifying factor(s): rest. Vitals:  height is _0  (1.778 m) and weight is 216 lb (98 kg). His temporal temperature is 97.4 F (36.3 C) (abnormal). His blood pressure is 125/96 (abnormal) and his pulse is 64. His respiration is 16 and oxygen saturation is 100%.   Reason for encounter: medication management.   The patient indicates doing well with the current medication regimen. No adverse reactions or side effects reported to the medications.   RTCB: 11/20/2020  Pharmacotherapy Assessment  Analgesic: Morphine IR 15 mg, 1 tablet p.o. every 6 hours (60 mg/day of morphine) (60 MME) + Compounded Specialty Analgesic Cream (w/ Ketamine) MME/day: 60 mg/day.   Monitoring: [place] PMP: PDMP reviewed during this encounter.       Pharmacotherapy: No side-effects or adverse reactions reported. Compliance: No problems identified. Effectiveness: Clinically acceptable.  Janett Billow, RN  08/21/2020  8:52 AM  Sign when Signing Visit Nursing Pain Medication Assessment:  Safety precautions to be maintained throughout the outpatient stay will include: orient to surroundings, keep bed in low position, maintain call bell within reach at all times, provide assistance with transfer out of bed and ambulation.  Medication Inspection Compliance: Pill count conducted under aseptic conditions, in front of the patient. Neither the pills nor the bottle was removed from the patient's sight at any time. Once count was completed pills were immediately returned to the patient in their original  bottle.  Medication: Morphine IR Pill/Patch Count:  4 of  120 pills remain Pill/Patch Appearance: Markings consistent with prescribed medication Bottle Appearance: Standard pharmacy container. Clearly labeled. Filled Date: 05 / 22 / 2022 Last Medication intake:  Today     UDS:  Summary  Date Value Ref Range Status  05/22/2020 Note  Final    Comment:    ==================================================================== [employer] Select 13 (MW) ==================================================================== Test                             Result       Flag       Units  Drug Present and Declared for Prescription Verification   Morphine                       >9259        EXPECTED   ng/mg creat   Normorphine                    92           EXPECTED   ng/mg creat    Potential sources of large amounts of morphine in the absence of    codeine include administration of morphine or use of heroin.     Normorphine is an expected metabolite of morphine.  ==================================================================== Test                      Result    Flag   Units      Ref Range   Creatinine              108              mg/dL      >=20 ==================================================================== Declared Medications:  The flagging and interpretation on this report are based on the  following declared medications.  Unexpected results may arise from  inaccuracies in the declared medications.   **Note: The testing scope of this panel includes these medications:   Morphine (MSIR)   **Note: The testing scope of this panel does not include the  following reported medications:   Albuterol (Ventolin HFA)  Amlodipine (Norvasc)  Atorvastatin (Lipitor)  Budesonide  Diphenhydramine (Benadryl)  Empagliflozin (Jardiance)  Esomeprazole (Nexium)  Fluticasone  Formoterol  Glycopyrrolate  Hydralazine (Apresoline)  Metformin (Glucophage)  Metoprolol (Lopressor)   Montelukast (Singulair)  Multivitamin  Nortriptyline (Pamelor)  Olodaterol (Stiolto Respimat)  Pantoprazole (Protonix)  Potassium  Prilocaine (EMLA)  Sumatriptan (Imitrex)  Tiotropium (Stiolto Respimat)  Topical Cyclobenzaprine  Topical Gabapentin  Topical Ketamine  Topical Lidocaine (Lidoderm)  Topical Lidocaine (EMLA) ==================================================================== For clinical consultation, please call [phone]. ====================================================================      ROS  Constitutional: Denies any fever or chills Gastrointestinal: No reported hemesis, hematochezia, vomiting, or acute GI distress Musculoskeletal: Denies any acute onset joint swelling, redness, loss of ROM, or weakness Neurological: No reported episodes of acute onset apraxia, aphasia, dysarthria, agnosia, amnesia, paralysis, loss of coordination, or loss of consciousness  Medication Review  Accu-Chek FastClix Lancets, Budeson-Glycopyrrol-Formoterol, NONFORMULARY OR COMPOUNDED ITEM, Potassium Chloride ER, SUMAtriptan, Tiotropium Bromide-Olodaterol, albuterol, amLODipine, atorvastatin, diphenhydrAMINE, empagliflozin, esomeprazole, glucose blood, hydrALAZINE, lidocaine, lidocaine-prilocaine, metFORMIN, metoprolol tartrate, montelukast, morphine, multivitamin with minerals, and nortriptyline  History Review  Allergy: [name] is allergic to acetaminophen, aspirin, epinephrine, novocain [procaine], penicillins, strawberry extract, and shellfish allergy. Drug: [name]  reports current drug use. Drug: Morphine. Alcohol:  reports no history of alcohol use. Tobacco:  reports that he has been smoking cigarettes. He  has a 8.75 pack-year smoking history. He has never used smokeless tobacco. Social: [name]  reports that he has been smoking cigarettes. He has a 8.75 pack-year smoking history. He has never used smokeless tobacco. He reports current drug use. Drug: Morphine.  He reports that he does not drink alcohol. Medical:  has a past medical history of Allergy, Arthritis, Benign hypertensive kidney disease, Chronic back pain, Diabetes mellitus, type 2 ([place]), Dyspnea, GERD (gastroesophageal reflux disease), Hypertension, Malignant neoplasm of lung ([place]), and Migraines. Surgical: [name]  has a past surgical history that includes Back surgery; Appendectomy; Leg Surgery; Knee surgery (Left); Foot surgery (Left); Colonoscopy with propofol (N/A, 02/19/2016); polypectomy (N/A, 02/19/2016); Total hip arthroplasty (Left, 12/02/2017); Colonoscopy with propofol (N/A, 01/19/2018); polypectomy (01/19/2018); DG OPERATIVE LEFT HIP ([place] HX); Joint replacement (Left, 12/2017); Video bronchoscopy (Left, 01/14/2019); Thoracotomy (Left, 01/14/2019); Flexible bronchoscopy (Bilateral, 01/20/2019); Flexible bronchoscopy (Bilateral, 01/22/2019); Electormagnetic navigation bronchoscopy (Left, 11/18/2018); Lung cancer surgery; Video bronchoscopy with endobronchial ultrasound (N/A, 10/15/2019); Video bronchoscopy with endobronchial navigation (N/A, 10/15/2019); and Portacath placement (N/A, 11/11/2019). Family: family history includes Cancer in his father; Diabetes in his sister; Thrombosis in his sister.  Laboratory Chemistry Profile   Renal Lab Results  Component Value Date   BUN 10 08/16/2020   CREATININE 0.88 08/16/2020   BCR 15 11/26/2019   GFRAA >60 12/01/2019   GFRNONAA >60 08/16/2020     Hepatic Lab Results  Component Value Date   AST 33 08/16/2020   ALT 36 08/16/2020   ALBUMIN 4.2 08/16/2020   ALKPHOS 112 08/16/2020   LIPASE 50 02/17/2019     Electrolytes Lab Results  Component Value Date   NA 138 08/16/2020   K 3.1 (L) 08/16/2020   CL 102 08/16/2020   CALCIUM 8.9 08/16/2020   MG 2.4 01/25/2019   PHOS 3.5 01/23/2019     Bone No results found for: VD25OH, VD125OH2TOT, VB1660AY0, KH9977SF4, 25OHVITD1, 25OHVITD2, 25OHVITD3, TESTOFREE, TESTOSTERONE   Inflammation  (CRP: Acute Phase) (ESR: Chronic Phase) Lab Results  Component Value Date   CRP 0.7 04/03/2015   ESRSEDRATE 11 10/21/2017   LATICACIDVEN 1.7 01/16/2019       Note: Above Lab results reviewed.  Recent Imaging Review  CT Chest W Contrast CLINICAL DATA:  Non-small-cell lung cancer.  Restaging.  EXAM: CT CHEST WITH CONTRAST  TECHNIQUE: Multidetector CT imaging of the chest was performed during intravenous contrast administration.  CONTRAST:  55m OMNIPAQUE IOHEXOL 300 MG/ML  SOLN  COMPARISON:  10/28/2019  FINDINGS: Cardiovascular: The heart size is normal. No substantial pericardial effusion. Atherosclerotic calcification is noted in the wall of the thoracic aorta. Left Port-A-Cath tip is positioned in the proximal SVC.  Mediastinum/Nodes: No mediastinal lymphadenopathy. 1.9 cm short axis subcarinal node measured previously is 1.0 cm today (62/2). 1.3 cm short axis AP window node measured previously is 0.6 cm short axis today (48/2) no evidence for gross hilar lymphadenopathy although assessment is limited by the lack of intravenous contrast on today's study. The esophagus has normal imaging features. There is no axillary lymphadenopathy.  Lungs/Pleura: Stable 4 mm posterior right upper lobe pulmonary nodule on 35/3. Surgical changes in the left hilum consistent with prior left lower lobectomy. No new suspicious pulmonary nodule or mass. No focal airspace consolidation. There is no evidence of pleural effusion.  Upper Abdomen: Calcified granulomata noted in the spleen.  Musculoskeletal: No worrisome lytic or sclerotic osseous abnormality.  IMPRESSION: 1. Interval resolution of mediastinal lymphadenopathy seen previously. 2. No new or progressive findings to suggest new  recurrent or metastatic disease. 3. Stable 4 mm posterior right upper lobe pulmonary nodule. Continued attention on follow-up recommended. 4. Aortic Atherosclerosis (ICD10-I70.0).  Electronically  Signed   By: Misty Stanley M.D.   On: 06/19/2020 11:14 Note: Reviewed        Physical Exam  General appearance: Well nourished, well developed, and well hydrated. In no apparent acute distress Mental status: Alert, oriented x 3 (person, place, & time)       Respiratory: No evidence of acute respiratory distress Eyes: PERLA Vitals: BP (!) 125/96 (BP Location: Right Arm, Patient Position: Sitting, Cuff Size: Large)   Pulse 64   Temp (!) 97.4 F (36.3 C) (Temporal)   Resp 16   Ht _0  (1.778 m)   Wt 216 lb (98 kg)   SpO2 100%   BMI 30.99 kg/m  BMI: Estimated body mass index is 30.99 kg/m as calculated from the following:   Height as of this encounter: _1  (1.778 m).   Weight as of this encounter: 216 lb (98 kg). Ideal: Ideal body weight: 73 kg (160 lb 15 oz) Adjusted ideal body weight: 83 kg (182 lb 15.4 oz)  Assessment   Status Diagnosis  Controlled Controlled Controlled 1. Chronic pain syndrome   2. 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)   3. Chronic low back pain (Primary Area of Pain) (Bilateral) (L>R)   4. Chronic lower extremity pain (Secondary area of Pain) (Left)   5. Chronic lumbar radicular pain (Secondary area of Pain) (S1 dermatomal) (Left)   6. Chronic hip pain (Left)   7. Pharmacologic therapy   8. Chronic use of opiate for therapeutic purpose      Updated Problems: No problems updated.   Plan of Care  Problem-specific:  No problem-specific Assessment & Plan notes found for this encounter.  [name] has a current medication list which includes the following long-term medication(s): amlodipine, atorvastatin, esomeprazole, hydralazine, metformin, metoprolol tartrate, [START ON 08/22/2020] morphine, [START ON 09/21/2020] morphine, [START ON 10/21/2020] morphine, nortriptyline, potassium chloride er, and sumatriptan.  Pharmacotherapy (Medications Ordered): Meds ordered this encounter  Medications   morphine (MSIR)  15 MG tablet    Sig: Take 1 tablet (15 mg total) by mouth every 6 (six) hours as needed for moderate pain or severe pain. Must last 30 days.    Dispense:  120 tablet    Refill:  0    Not a duplicate. Do NOT delete! Dispense 1 day early if closed on refill date. Avoid benzodiazepines within 8 hours of opioids. Do not send refill requests.   morphine (MSIR) 15 MG tablet    Sig: Take 1 tablet (15 mg total) by mouth every 6 (six) hours as needed for moderate pain or severe pain. Must last 30 days.    Dispense:  120 tablet    Refill:  0    Not a duplicate. Do NOT delete! Dispense 1 day early if closed on refill date. Avoid benzodiazepines within 8 hours of opioids. Do not send refill requests.   morphine (MSIR) 15 MG tablet    Sig: Take 1 tablet (15 mg total) by mouth every 6 (six) hours as needed for moderate pain or severe pain. Must last 30 days.    Dispense:  120 tablet    Refill:  0    Not a duplicate. Do NOT delete! Dispense 1 day early if closed on refill date. Avoid benzodiazepines within 8 hours of opioids. Do not send refill requests.  DISCONTD: NONFORMULARY OR COMPOUNDED ITEM    Sig: Apply 1-2 mLs topically 4 (four) times daily as needed. 10% Ketamine/2% Cyclobenzaprine/6% Gabapentin Cream    Dispense:  240 each    Refill:  PRN    Please compound: Ketamine (10%) + Cyclobenzaprine (2%) + Gabapentin Cream (6%) into cream   NONFORMULARY OR COMPOUNDED ITEM    Sig: Apply 1-2 mLs topically 4 (four) times daily as needed. 10% Ketamine/2% Cyclobenzaprine/6% Gabapentin Cream    Dispense:  240 each    Refill:  PRN    Please compound: Ketamine (10%) + Cyclobenzaprine (2%) + Gabapentin Cream (6%) into cream    Orders:  No orders of the defined types were placed in this encounter.  Follow-up plan:   Return in about 13 weeks (around 11/20/2020) for evaluation day (F2F) (MM).     Interventional therapies: Planned, scheduled, and/or pending:   None at this time. Claims to have anaphylactic  allergy to Local Anesthetics. He was sent for testing and shown/proven not to be allergic to Lidocaine.   Considering:   None at this time. Claims to have anaphylactic allergy to Local Anesthetics. He was sent for testing and shown/proven not to be allergic to Lidocaine.   Palliative PRN treatment(s):   None at this time. Claims to have anaphylactic allergy to Local Anesthetics. He was sent for testing and shown/proven not to be allergic to Lidocaine.          Recent Visits No visits were found meeting these conditions. Showing recent visits within past 90 days and meeting all other requirements Today's Visits Date Type Provider Dept  08/21/20 Office Visit [name], MD [facility]  Showing today's visits and meeting all other requirements Future Appointments No visits were found meeting these conditions. Showing future appointments within next 90 days and meeting all other requirements I discussed the assessment and treatment plan with the patient. The patient was provided an opportunity to ask questions and all were answered. The patient agreed with the plan and demonstrated an understanding of the instructions.  Patient advised to call back or seek an in-person evaluation if the symptoms or condition worsens.  Duration of encounter: 30 minutes.  Note by: Gaspar Cola, MD Date: 08/21/2020; Time: 9:57 AM

## 2020-08-21 ENCOUNTER — Ambulatory Visit: Payer: Medicare Other | Attending: Pain Medicine | Admitting: Pain Medicine

## 2020-08-21 ENCOUNTER — Encounter: Payer: Self-pay | Admitting: Pain Medicine

## 2020-08-21 ENCOUNTER — Other Ambulatory Visit: Payer: Self-pay

## 2020-08-21 VITALS — BP 125/96 | HR 64 | Temp 97.4°F | Resp 16 | Ht 70.0 in | Wt 216.0 lb

## 2020-08-21 DIAGNOSIS — Z79891 Long term (current) use of opiate analgesic: Secondary | ICD-10-CM | POA: Insufficient documentation

## 2020-08-21 DIAGNOSIS — M5416 Radiculopathy, lumbar region: Secondary | ICD-10-CM

## 2020-08-21 DIAGNOSIS — G8929 Other chronic pain: Secondary | ICD-10-CM | POA: Diagnosis not present

## 2020-08-21 DIAGNOSIS — M961 Postlaminectomy syndrome, not elsewhere classified: Secondary | ICD-10-CM | POA: Insufficient documentation

## 2020-08-21 DIAGNOSIS — Z79899 Other long term (current) drug therapy: Secondary | ICD-10-CM | POA: Insufficient documentation

## 2020-08-21 DIAGNOSIS — M5441 Lumbago with sciatica, right side: Secondary | ICD-10-CM | POA: Diagnosis not present

## 2020-08-21 DIAGNOSIS — M79605 Pain in left leg: Secondary | ICD-10-CM | POA: Insufficient documentation

## 2020-08-21 DIAGNOSIS — M25552 Pain in left hip: Secondary | ICD-10-CM | POA: Diagnosis not present

## 2020-08-21 DIAGNOSIS — M5442 Lumbago with sciatica, left side: Secondary | ICD-10-CM | POA: Diagnosis not present

## 2020-08-21 DIAGNOSIS — G894 Chronic pain syndrome: Secondary | ICD-10-CM | POA: Insufficient documentation

## 2020-08-21 MED ORDER — NONFORMULARY OR COMPOUNDED ITEM: 1.0000 mL | Freq: Four times a day (QID) | 99 refills | Status: DC | PRN

## 2020-08-21 MED ORDER — MORPHINE SULFATE 15 MG PO TABS: 15.0000 mg | ORAL_TABLET | Freq: Four times a day (QID) | ORAL | 0 refills | Status: DC | PRN

## 2020-08-21 NOTE — Progress Notes (Signed)
Nursing Pain Medication Assessment:  Safety precautions to be maintained throughout the outpatient stay will include: orient to surroundings, keep bed in low position, maintain call bell within reach at all times, provide assistance with transfer out of bed and ambulation.  Medication Inspection Compliance: Pill count conducted under aseptic conditions, in front of the patient. Neither the pills nor the bottle was removed from the patient's sight at any time. Once count was completed pills were immediately returned to the patient in their original bottle.  Medication: Morphine IR Pill/Patch Count:  4 of 120 pills remain Pill/Patch Appearance: Markings consistent with prescribed medication Bottle Appearance: Standard pharmacy container. Clearly labeled. Filled Date: 05 / 22 / 2022 Last Medication intake:  Today

## 2020-08-21 NOTE — Patient Instructions (Signed)
____________________________________________________________________________________________  Medication Rules  Purpose: To inform patients, and their family members, of our rules and regulations.  Applies to: All patients receiving prescriptions (written or electronic).  Pharmacy of record: Pharmacy where electronic prescriptions will be sent. If written prescriptions are taken to a different pharmacy, please inform the nursing staff. The pharmacy listed in the electronic medical record should be the one where you would like electronic prescriptions to be sent.  Electronic prescriptions: In compliance with the [place] Strengthen Opioid Misuse Prevention (STOP) Act of 2017 (Session Law 2017-74/H243), effective March 04, 2018, all controlled substances must be electronically prescribed. Calling prescriptions to the pharmacy will cease to exist.  Prescription refills: Only during scheduled appointments. Applies to all prescriptions.  NOTE: The following applies primarily to controlled substances (Opioid* Pain Medications).   Type of encounter (visit): For patients receiving controlled substances, face-to-face visits are required. (Not an option or up to the patient.)  Patient's responsibilities: Pain Pills: Bring all pain pills to every appointment (except for procedure appointments). Pill Bottles: Bring pills in original pharmacy bottle. Always bring the newest bottle. Bring bottle, even if empty. Medication refills: You are responsible for knowing and keeping track of what medications you take and those you need refilled. The day before your appointment: write a list of all prescriptions that need to be refilled. The day of the appointment: give the list to the admitting nurse. Prescriptions will be written only during appointments. No prescriptions will be written on procedure days. If you forget a medication: it will not be "Called in", "Faxed", or "electronically sent". You will  need to get another appointment to get these prescribed. No early refills. Do not call asking to have your prescription filled early. Prescription Accuracy: You are responsible for carefully inspecting your prescriptions before leaving our office. Have the discharge nurse carefully go over each prescription with you, before taking them home. Make sure that your name is accurately spelled, that your address is correct. Check the name and dose of your medication to make sure it is accurate. Check the number of pills, and the written instructions to make sure they are clear and accurate. Make sure that you are given enough medication to last until your next medication refill appointment. Taking Medication: Take medication as prescribed. When it comes to controlled substances, taking less pills or less frequently than prescribed is permitted and encouraged. Never take more pills than instructed. Never take medication more frequently than prescribed.  Inform other Doctors: Always inform, all of your healthcare providers, of all the medications you take. Pain Medication from other Providers: You are not allowed to accept any additional pain medication from any other Doctor or Healthcare provider. There are two exceptions to this rule. (see below) In the event that you require additional pain medication, you are responsible for notifying us, as stated below. Cough Medicine: Often these contain an opioid, such as codeine or hydrocodone. Never accept or take cough medicine containing these opioids if you are already taking an opioid* medication. The combination may cause respiratory failure and death. Medication Agreement: You are responsible for carefully reading and following our Medication Agreement. This must be signed before receiving any prescriptions from our practice. Safely store a copy of your signed Agreement. Violations to the Agreement will result in no further prescriptions. (Additional copies of our  Medication Agreement are available upon request.) Laws, Rules, & Regulations: All patients are expected to follow all Federal and State Laws, Statutes, Rules, & Regulations. Ignorance of   the Laws does not constitute a valid excuse.  Illegal drugs and Controlled Substances: The use of illegal substances (including, but not limited to marijuana and its derivatives) and/or the illegal use of any controlled substances is strictly prohibited. Violation of this rule may result in the immediate and permanent discontinuation of any and all prescriptions being written by our practice. The use of any illegal substances is prohibited. Adopted CDC guidelines & recommendations: Target dosing levels will be at or below 60 MME/day. Use of benzodiazepines** is not recommended.  Exceptions: There are only two exceptions to the rule of not receiving pain medications from other Healthcare Providers. Exception #1 (Emergencies): In the event of an emergency (i.e.: accident requiring emergency care), you are allowed to receive additional pain medication. However, you are responsible for: As soon as you are able, call our office [phone], at any time of the day or night, and leave a message stating your name, the date and nature of the emergency, and the name and dose of the medication prescribed. In the event that your call is answered by a member of our staff, make sure to document and save the date, time, and the name of the person that took your information.  Exception #2 (Planned Surgery): In the event that you are scheduled by another doctor or dentist to have any type of surgery or procedure, you are allowed (for a period no longer than 30 days), to receive additional pain medication, for the acute post-op pain. However, in this case, you are responsible for picking up a copy of our "Post-op Pain Management for Surgeons" handout, and giving it to your surgeon or dentist. This document is available at our office, and  does not require an appointment to obtain it. Simply go to our office during business hours (Monday-Thursday from 8:00 AM to 4:00 PM) (Friday 8:00 AM to 12:00 Noon) or if you have a scheduled appointment with us, prior to your surgery, and ask for it by name. In addition, you are responsible for: calling our office [phone], at any time of the day or night, and leaving a message stating your name, name of your surgeon, type of surgery, and date of procedure or surgery. Failure to comply with your responsibilities may result in termination of therapy involving the controlled substances.  *Opioid medications include: morphine, codeine, oxycodone, oxymorphone, hydrocodone, hydromorphone, meperidine, tramadol, tapentadol, buprenorphine, fentanyl, methadone. **Benzodiazepine medications include: diazepam (Valium), alprazolam (Xanax), clonazepam (Klonopine), lorazepam (Ativan), clorazepate (Tranxene), chlordiazepoxide (Librium), estazolam (Prosom), oxazepam (Serax), temazepam (Restoril), triazolam (Halcion) (Last updated: 01/31/2020) ____________________________________________________________________________________________  ____________________________________________________________________________________________  Medication Recommendations and Reminders  Applies to: All patients receiving prescriptions (written and/or electronic).  Medication Rules & Regulations: These rules and regulations exist for your safety and that of others. They are not flexible and neither are we. Dismissing or ignoring them will be considered "non-compliance" with medication therapy, resulting in complete and irreversible termination of such therapy. (See document titled "Medication Rules" for more details.) In all conscience, because of safety reasons, we cannot continue providing a therapy where the patient does not follow instructions.  Pharmacy of record:  Definition: This is the pharmacy where your electronic  prescriptions will be sent.  We do not endorse any particular pharmacy, however, we have experienced problems with [facility] not securing enough medication supply for the community. We do not restrict you in your choice of pharmacy. However, once we write for your prescriptions, we will NOT be re-sending more prescriptions to fix restricted supply problems   created by your pharmacy, or your insurance.  The pharmacy listed in the electronic medical record should be the one where you want electronic prescriptions to be sent. If you choose to change pharmacy, simply notify our nursing staff.  Recommendations: Keep all of your pain medications in a safe place, under lock and key, even if you live alone. We will NOT replace lost, stolen, or damaged medication. After you fill your prescription, take 1 week's worth of pills and put them away in a safe place. You should keep a separate, properly labeled bottle for this purpose. The remainder should be kept in the original bottle. Use this as your primary supply, until it runs out. Once it's gone, then you know that you have 1 week's worth of medicine, and it is time to come in for a prescription refill. If you do this correctly, it is unlikely that you will ever run out of medicine. To make sure that the above recommendation works, it is very important that you make sure your medication refill appointments are scheduled at least 1 week before you run out of medicine. To do this in an effective manner, make sure that you do not leave the office without scheduling your next medication management appointment. Always ask the nursing staff to show you in your prescription , when your medication will be running out. Then arrange for the receptionist to get you a return appointment, at least 7 days before you run out of medicine. Do not wait until you have 1 or 2 pills left, to come in. This is very poor planning and does not take into consideration that we may need to  cancel appointments due to bad weather, sickness, or emergencies affecting our staff. DO NOT ACCEPT A "Partial Fill": If for any reason your pharmacy does not have enough pills/tablets to completely fill or refill your prescription, do not allow for a "partial fill". The law allows the pharmacy to complete that prescription within 72 hours, without requiring a new prescription. If they do not fill the rest of your prescription within those 72 hours, you will need a separate prescription to fill the remaining amount, which we will NOT provide. If the reason for the partial fill is your insurance, you will need to talk to the pharmacist about payment alternatives for the remaining tablets, but again, DO NOT ACCEPT A PARTIAL FILL, unless you can trust your pharmacist to obtain the remainder of the pills within 72 hours.  Prescription refills and/or changes in medication(s):  Prescription refills, and/or changes in dose or medication, will be conducted only during scheduled medication management appointments. (Applies to both, written and electronic prescriptions.) No refills on procedure days. No medication will be changed or started on procedure days. No changes, adjustments, and/or refills will be conducted on a procedure day. Doing so will interfere with the diagnostic portion of the procedure. No phone refills. No medications will be "called into the pharmacy". No Fax refills. No weekend refills. No Holliday refills. No after hours refills.  Remember:  Business hours are:  Monday to Thursday 8:00 AM to 4:00 PM Provider's Schedule: Gillian Meeuwsen, MD - Appointments are:  Medication management: Monday and Wednesday 8:00 AM to 4:00 PM Procedure day: Tuesday and Thursday 7:30 AM to 4:00 PM Bilal Lateef, MD - Appointments are:  Medication management: Tuesday and Thursday 8:00 AM to 4:00 PM Procedure day: Monday and Wednesday 7:30 AM to 4:00 PM (Last update:  09/22/2019) ____________________________________________________________________________________________  ____________________________________________________________________________________________  CBD (cannabidiol) WARNING    Applicable to: All individuals currently taking or considering taking CBD (cannabidiol) and, more important, all patients taking opioid analgesic controlled substances (pain medication). (Example: oxycodone; oxymorphone; hydrocodone; hydromorphone; morphine; methadone; tramadol; tapentadol; fentanyl; buprenorphine; butorphanol; dextromethorphan; meperidine; codeine; etc.)  Legal status: CBD remains a Schedule I drug prohibited for any use. CBD is illegal with one exception. In the [place], CBD has a limited Food and Drug Administration (FDA) approval for the treatment of two specific types of epilepsy disorders. Only one CBD product has been approved by the FDA for this purpose: "Epidiolex". FDA is aware that some companies are marketing products containing cannabis and cannabis-derived compounds in ways that violate the Federal Food, Drug and Cosmetic Act (FD&C Act) and that may put the health and safety of consumers at risk. The FDA, a Federal agency, has not enforced the CBD status since 2018.   Legality: Some manufacturers ship CBD products nationally, which is illegal. Often such products are sold online and are therefore available throughout the country. CBD is openly sold in head shops and health food stores in some states where such sales have not been explicitly legalized. Selling unapproved products with unsubstantiated therapeutic claims is not only a violation of the law, but also can put patients at risk, as these products have not been proven to be safe or effective. Federal illegality makes it difficult to conduct research on CBD.  Reference: "FDA Regulation of Cannabis and Cannabis-Derived Products, Including Cannabidiol (CBD)" -  https://www.fda.gov/news-events/public-health-focus/fda-regulation-cannabis-and-cannabis-derived-products-including-cannabidiol-cbd  Warning: CBD is not FDA approved and has not undergo the same manufacturing controls as prescription drugs.  This means that the purity and safety of available CBD may be questionable. Most of the time, despite manufacturer's claims, it is contaminated with THC (delta-9-tetrahydrocannabinol - the chemical in marijuana responsible for the "HIGH").  When this is the case, the THC contaminant will trigger a positive urine drug screen (UDS) test for Marijuana (carboxy-THC). Because a positive UDS for any illicit substance is a violation of our medication agreement, your opioid analgesics (pain medicine) may be permanently discontinued.  MORE ABOUT CBD  General Information: CBD  is a derivative of the Marijuana (cannabis sativa) plant discovered in 1940. It is one of the 113 identified substances found in Marijuana. It accounts for up to 40% of the plant's extract. As of 2018, preliminary clinical studies on CBD included research for the treatment of anxiety, movement disorders, and pain. CBD is available and consumed in multiple forms, including inhalation of smoke or vapor, as an aerosol spray, and by mouth. It may be supplied as an oil containing CBD, capsules, dried cannabis, or as a liquid solution. CBD is thought not to be as psychoactive as THC (delta-9-tetrahydrocannabinol - the chemical in marijuana responsible for the "HIGH"). Studies suggest that CBD may interact with different biological target receptors in the body, including cannabinoid and other neurotransmitter receptors. As of 2018 the mechanism of action for its biological effects has not been determined.  Side-effects  Adverse reactions: Dry mouth, diarrhea, decreased appetite, fatigue, drowsiness, malaise, weakness, sleep disturbances, and others.  Drug interactions: CBC may interact with other medications  such as blood-thinners. (Last update: 10/09/2019) ____________________________________________________________________________________________  ____________________________________________________________________________________________  Drug Holidays (Slow)  What is a "Drug Holiday"? Drug Holiday: is the name given to the period of time during which a patient stops taking a medication(s) for the purpose of eliminating tolerance to the drug.  Benefits Improved effectiveness of opioids. Decreased opioid dose needed to achieve benefits. Improved pain with lesser dose.    What is tolerance? Tolerance: is the progressive decreased in effectiveness of a drug due to its repetitive use. With repetitive use, the body gets use to the medication and as a consequence, it loses its effectiveness. This is a common problem seen with opioid pain medications. As a result, a larger dose of the drug is needed to achieve the same effect that used to be obtained with a smaller dose.  How long should a "Drug Holiday" last? You should stay off of the pain medicine for at least 14 consecutive days. (2 weeks)  Should I stop the medicine "cold turkey"? No. You should always coordinate with your Pain Specialist so that he/she can provide you with the correct medication dose to make the transition as smoothly as possible.  How do I stop the medicine? Slowly. You will be instructed to decrease the daily amount of pills that you take by one (1) pill every seven (7) days. This is called a "slow downward taper" of your dose. For example: if you normally take four (4) pills per day, you will be asked to drop this dose to three (3) pills per day for seven (7) days, then to two (2) pills per day for seven (7) days, then to one (1) per day for seven (7) days, and at the end of those last seven (7) days, this is when the "Drug Holiday" would start.   Will I have withdrawals? By doing a "slow downward taper" like this one, it  is unlikely that you will experience any significant withdrawal symptoms. Typically, what triggers withdrawals is the sudden stop of a high dose opioid therapy. Withdrawals can usually be avoided by slowly decreasing the dose over a prolonged period of time. If you do not follow these instructions and decide to stop your medication abruptly, withdrawals may be possible.  What are withdrawals? Withdrawals: refers to the wide range of symptoms that occur after stopping or dramatically reducing opiate drugs after heavy and prolonged use. Withdrawal symptoms do not occur to patients that use low dose opioids, or those who take the medication sporadically. Contrary to benzodiazepine (example: Valium, Xanax, etc.) or alcohol withdrawals ("Delirium Tremens"), opioid withdrawals are not lethal. Withdrawals are the physical manifestation of the body getting rid of the excess receptors.  Expected Symptoms Early symptoms of withdrawal may include: Agitation Anxiety Muscle aches Increased tearing Insomnia Runny nose Sweating Yawning  Late symptoms of withdrawal may include: Abdominal cramping Diarrhea Dilated pupils Goose bumps Nausea Vomiting  Will I experience withdrawals? Due to the slow nature of the taper, it is very unlikely that you will experience any.  What is a slow taper? Taper: refers to the gradual decrease in dose.  (Last update: 09/22/2019) ____________________________________________________________________________________________    

## 2020-08-22 ENCOUNTER — Telehealth: Payer: Self-pay | Admitting: *Deleted

## 2020-08-22 NOTE — Telephone Encounter (Signed)
   Telephone encounter was:  Unsuccessful.  08/22/2020 Name: SHADRICK SENNE MRN: [id number] DOB: [birth year]  Unsuccessful outbound call made today to assist with:   financial  Outreach Attempt:  1st Attempt  A HIPAA compliant voice message was left requesting a return call.  Instructed patient to call back at    Instructed patient to call back at [phone]  at their earliest convenience.  [place]  [phone] [address] , [place] [address] Email : [email or web address]

## 2020-08-28 ENCOUNTER — Other Ambulatory Visit: Payer: Self-pay | Admitting: Family Medicine

## 2020-08-29 ENCOUNTER — Ambulatory Visit: Payer: Medicare Other | Admitting: Family Medicine

## 2020-08-30 ENCOUNTER — Inpatient Hospital Stay: Payer: Medicare Other

## 2020-08-30 ENCOUNTER — Other Ambulatory Visit: Payer: Self-pay

## 2020-08-30 VITALS — BP 112/75 | HR 63 | Temp 96.1°F | Resp 16 | Wt 219.0 lb

## 2020-08-30 DIAGNOSIS — Z880 Allergy status to penicillin: Secondary | ICD-10-CM | POA: Diagnosis not present

## 2020-08-30 DIAGNOSIS — Z5112 Encounter for antineoplastic immunotherapy: Secondary | ICD-10-CM | POA: Diagnosis not present

## 2020-08-30 DIAGNOSIS — C3432 Malignant neoplasm of lower lobe, left bronchus or lung: Secondary | ICD-10-CM | POA: Diagnosis not present

## 2020-08-30 DIAGNOSIS — Z95828 Presence of other vascular implants and grafts: Secondary | ICD-10-CM

## 2020-08-30 DIAGNOSIS — C3492 Malignant neoplasm of unspecified part of left bronchus or lung: Secondary | ICD-10-CM

## 2020-08-30 DIAGNOSIS — Z9049 Acquired absence of other specified parts of digestive tract: Secondary | ICD-10-CM | POA: Diagnosis not present

## 2020-08-30 DIAGNOSIS — I129 Hypertensive chronic kidney disease with stage 1 through stage 4 chronic kidney disease, or unspecified chronic kidney disease: Secondary | ICD-10-CM | POA: Diagnosis not present

## 2020-08-30 DIAGNOSIS — R110 Nausea: Secondary | ICD-10-CM | POA: Diagnosis not present

## 2020-08-30 DIAGNOSIS — Z809 Family history of malignant neoplasm, unspecified: Secondary | ICD-10-CM | POA: Diagnosis not present

## 2020-08-30 DIAGNOSIS — Z8249 Family history of ischemic heart disease and other diseases of the circulatory system: Secondary | ICD-10-CM | POA: Diagnosis not present

## 2020-08-30 DIAGNOSIS — E876 Hypokalemia: Secondary | ICD-10-CM | POA: Diagnosis not present

## 2020-08-30 DIAGNOSIS — N181 Chronic kidney disease, stage 1: Secondary | ICD-10-CM | POA: Diagnosis not present

## 2020-08-30 DIAGNOSIS — Z833 Family history of diabetes mellitus: Secondary | ICD-10-CM | POA: Diagnosis not present

## 2020-08-30 DIAGNOSIS — Z886 Allergy status to analgesic agent status: Secondary | ICD-10-CM | POA: Diagnosis not present

## 2020-08-30 DIAGNOSIS — Z79899 Other long term (current) drug therapy: Secondary | ICD-10-CM | POA: Diagnosis not present

## 2020-08-30 DIAGNOSIS — E1122 Type 2 diabetes mellitus with diabetic chronic kidney disease: Secondary | ICD-10-CM | POA: Diagnosis not present

## 2020-08-30 DIAGNOSIS — G8929 Other chronic pain: Secondary | ICD-10-CM | POA: Diagnosis not present

## 2020-08-30 MED ORDER — SODIUM CHLORIDE 0.9 % IV SOLN
Freq: Once | INTRAVENOUS | Status: AC
  Filled 2020-08-30: qty 250

## 2020-08-30 MED ORDER — HEPARIN SOD (PORK) LOCK FLUSH 100 UNIT/ML IV SOLN
500.0000 [IU] | Freq: Once | INTRAVENOUS | Status: DC
  Filled 2020-08-30: qty 5

## 2020-08-30 MED ORDER — SODIUM CHLORIDE 0.9 % IV SOLN
10.0000 mg/kg | Freq: Once | INTRAVENOUS | Status: AC
  Administered 2020-08-30: 1000 mg via INTRAVENOUS
  Filled 2020-08-30: qty 20

## 2020-08-30 MED ORDER — SODIUM CHLORIDE 0.9% FLUSH
10.0000 mL | INTRAVENOUS | Status: DC | PRN
  Administered 2020-08-30: 10 mL
  Filled 2020-08-30: qty 10

## 2020-08-30 MED ORDER — SODIUM CHLORIDE 0.9% FLUSH
10.0000 mL | Freq: Once | INTRAVENOUS | Status: DC
  Filled 2020-08-30: qty 10

## 2020-08-30 MED ORDER — HEPARIN SOD (PORK) LOCK FLUSH 100 UNIT/ML IV SOLN
500.0000 [IU] | Freq: Once | INTRAVENOUS | Status: AC | PRN
  Administered 2020-08-30: 500 [IU]
  Filled 2020-08-30: qty 5

## 2020-08-30 NOTE — Patient Instructions (Signed)
[facility]  Discharge Instructions: Thank you for choosing [facility] to provide your oncology and hematology care.  If you have a lab appointment with the [facility], please go directly to the [facility] and check in at the registration area.  Wear comfortable clothing and clothing appropriate for easy access to any Portacath or PICC line.   We strive to give you quality time with your provider. You may need to reschedule your appointment if you arrive late (15 or more minutes).  Arriving late affects you and other patients whose appointments are after yours.  Also, if you miss three or more appointments without notifying the office, you may be dismissed from the clinic at the provider's discretion.      For prescription refill requests, have your pharmacy contact our office and allow 72 hours for refills to be completed.    Today you received the following chemotherapy and/or immunotherapy agents durvalumab      To help prevent nausea and vomiting after your treatment, we encourage you to take your nausea medication as directed.  BELOW ARE SYMPTOMS THAT SHOULD BE REPORTED IMMEDIATELY: *FEVER GREATER THAN 100.4 F (38 C) OR HIGHER *CHILLS OR SWEATING *NAUSEA AND VOMITING THAT IS NOT CONTROLLED WITH YOUR NAUSEA MEDICATION *UNUSUAL SHORTNESS OF BREATH *UNUSUAL BRUISING OR BLEEDING *URINARY PROBLEMS (pain or burning when urinating, or frequent urination) *BOWEL PROBLEMS (unusual diarrhea, constipation, pain near the anus) TENDERNESS IN MOUTH AND THROAT WITH OR WITHOUT PRESENCE OF ULCERS (sore throat, sores in mouth, or a toothache) UNUSUAL RASH, SWELLING OR PAIN  UNUSUAL VAGINAL DISCHARGE OR ITCHING   Items with * indicate a potential emergency and should be followed up as soon as possible or go to the Emergency Department if any problems should occur.  Please show the CHEMOTHERAPY ALERT CARD or IMMUNOTHERAPY ALERT CARD at check-in  to the Emergency Department and triage nurse.  Should you have questions after your visit or need to cancel or reschedule your appointment, please contact [facility]  [phone] and follow the prompts.  Office hours are 8:00 a.m. to 4:30 p.m. Monday - Friday. Please note that voicemails left after 4:00 p.m. may not be returned until the following business day.  We are closed weekends and major holidays. You have access to a nurse at all times for urgent questions. Please call the main number to the clinic [phone] and follow the prompts.  For any non-urgent questions, you may also contact your provider using MyChart. We now offer e-Visits for anyone 52 and older to request care online for non-urgent symptoms. For details visit [email or web address].   Also download the MyChart app! Go to the app store, search "MyChart", open the app, select [facility], and log in with your MyChart username and password.  Due to Covid, a mask is required upon entering the hospital/clinic. If you do not have a mask, one will be given to you upon arrival. For doctor visits, patients may have 1 support person aged 71 or older with them. For treatment visits, patients cannot have anyone with them due to current Covid guidelines and our immunocompromised population.     Durvalumab injection What is this medication? DURVALUMAB (dur VAL ue mab) is a monoclonal antibody. It is used to treat lungcancer. This medicine may be used for other purposes; ask your health care provider orpharmacist if you have questions. COMMON BRAND NAME(S): IMFINZI What should I tell my care team before I take  this medication? They need to know if you have any of these conditions: autoimmune diseases like Crohn's disease, ulcerative colitis, or lupus have had or planning to have an allogeneic stem cell transplant (uses someone else's stem cells) history of organ transplant history of radiation to  the chest nervous system problems like myasthenia gravis or Guillain-Barre syndrome an unusual or allergic reaction to durvalumab, other medicines, foods, dyes, or preservatives pregnant or trying to get pregnant breast-feeding How should I use this medication? This medicine is for infusion into a vein. It is given by a health careprofessional in a hospital or clinic setting. A special MedGuide will be given to you before each treatment. Be sure to readthis information carefully each time. Talk to your pediatrician regarding the use of this medicine in children.Special care may be needed. Overdosage: If you think you have taken too much of this medicine contact apoison control center or emergency room at once. NOTE: This medicine is only for you. Do not share this medicine with others. What if I miss a dose? It is important not to miss your dose. Call your doctor or health careprofessional if you are unable to keep an appointment. What may interact with this medication? Interactions have not been studied. This list may not describe all possible interactions. Give your health care provider a list of all the medicines, herbs, non-prescription drugs, or dietary supplements you use. Also tell them if you smoke, drink alcohol, or use illegaldrugs. Some items may interact with your medicine. What should I watch for while using this medication? This drug may make you feel generally unwell. Continue your course of treatmenteven though you feel ill unless your doctor tells you to stop. You may need blood work done while you are taking this medicine. Do not become pregnant while taking this medicine or for 3 months after stopping it. Women should inform their doctor if they wish to become pregnant or think they might be pregnant. There is a potential for serious side effects to an unborn child. Talk to your health care professional or pharmacist for more information. Do not breast-feed an infant while  taking this medicine orfor 3 months after stopping it. What side effects may I notice from receiving this medication? Side effects that you should report to your doctor or health care professionalas soon as possible: allergic reactions like skin rash, itching or hives, swelling of the face, lips, or tongue black, tarry stools bloody or watery diarrhea breathing problems change in emotions or moods change in sex drive changes in vision chest pain or chest tightness chills confusion cough facial flushing fever headache signs and symptoms of high blood sugar such as dizziness; dry mouth; dry skin; fruity breath; nausea; stomach pain; increased hunger or thirst; increased urination signs and symptoms of liver injury like dark yellow or brown urine; general ill feeling or flu-like symptoms; light-colored stools; loss of appetite; nausea; right upper belly pain; unusually weak or tired; yellowing of the eyes or skin stomach pain trouble passing urine or change in the amount of urine weight gain or weight loss Side effects that usually do not require medical attention (report these toyour doctor or health care professional if they continue or are bothersome): bone pain constipation loss of appetite muscle pain nausea swelling of the ankles, feet, hands tiredness This list may not describe all possible side effects. Call your doctor for medical advice about side effects. You may report side effects to FDA at1-800-FDA-1088. Where should I keep my medication?  This drug is given in a hospital or clinic and will not be stored at home. NOTE: This sheet is a summary. It may not cover all possible information. If you have questions about this medicine, talk to your doctor, pharmacist, orhealth care provider.  2022 Elsevier/Gold Standard (2019-04-29 13:01:29)

## 2020-08-30 NOTE — Progress Notes (Signed)
Pt tolerated all infusions well today and left the chemo suite stable and ambulatory.

## 2020-09-01 ENCOUNTER — Encounter: Payer: Self-pay | Admitting: Family Medicine

## 2020-09-01 ENCOUNTER — Ambulatory Visit (INDEPENDENT_AMBULATORY_CARE_PROVIDER_SITE_OTHER): Payer: Medicare Other | Admitting: Family Medicine

## 2020-09-01 ENCOUNTER — Other Ambulatory Visit: Payer: Self-pay

## 2020-09-01 VITALS — BP 130/83 | HR 80 | Temp 98.4°F | Ht 69.0 in | Wt 224.8 lb

## 2020-09-01 DIAGNOSIS — I10 Essential (primary) hypertension: Secondary | ICD-10-CM

## 2020-09-01 DIAGNOSIS — E785 Hyperlipidemia, unspecified: Secondary | ICD-10-CM | POA: Diagnosis not present

## 2020-09-01 DIAGNOSIS — N181 Chronic kidney disease, stage 1: Secondary | ICD-10-CM | POA: Diagnosis not present

## 2020-09-01 DIAGNOSIS — I129 Hypertensive chronic kidney disease with stage 1 through stage 4 chronic kidney disease, or unspecified chronic kidney disease: Secondary | ICD-10-CM | POA: Diagnosis not present

## 2020-09-01 DIAGNOSIS — E1122 Type 2 diabetes mellitus with diabetic chronic kidney disease: Secondary | ICD-10-CM

## 2020-09-01 DIAGNOSIS — E1169 Type 2 diabetes mellitus with other specified complication: Secondary | ICD-10-CM | POA: Diagnosis not present

## 2020-09-01 LAB — BAYER DCA HB A1C WAIVED: HB A1C (BAYER DCA - WAIVED): 6.1 % (ref <7.0)

## 2020-09-01 MED ORDER — ESOMEPRAZOLE MAGNESIUM 40 MG PO CPDR: 40.0000 mg | DELAYED_RELEASE_CAPSULE | Freq: Every day | ORAL | 3 refills | Status: DC

## 2020-09-01 NOTE — Progress Notes (Signed)
BP 130/83   Pulse 80   Temp 98.4 F (36.9 C)   Ht 5' 9"  (1.753 m)   Wt 224 lb 12.8 oz (102 kg)   SpO2 96%   BMI 33.20 kg/m    Subjective:    Patient ID: Kristopher [name], male    DOB: 04-Aug-1960, 60 y.o.   MRN: [id number]  HPI: Kristopher [name] is a 60 y.o. male  Chief Complaint  Patient presents with   Diabetes   Hyperlipidemia   Hypertension   HYPERTENSION / [place] Satisfied with current treatment? yes Duration of hypertension: chronic BP monitoring frequency: a few times a month BP medication side effects: no Duration of hyperlipidemia: chronic Cholesterol medication side effects: no Cholesterol supplements: none Past cholesterol medications: atorvastatin Medication compliance: excellent compliance Aspirin: no Recent stressors: no Recurrent headaches: no Visual changes: no Palpitations: no Dyspnea: no Chest pain: no Lower extremity edema: no Dizzy/lightheaded: no  DIABETES Hypoglycemic episodes:no Polydipsia/polyuria: no Visual disturbance: no Chest pain: no Paresthesias: no Glucose Monitoring: no Taking Insulin?: no Blood Pressure Monitoring: a few times a month Retinal Examination: Up to Date Foot Exam: Up to Date Diabetic Education: Completed Pneumovax: Up to Date Influenza: Up to Date Aspirin: no  Relevant past medical, surgical, family and social history reviewed and updated as indicated. Interim medical history since our last visit reviewed. Allergies and medications reviewed and updated.  Review of Systems  Constitutional: Negative.   Respiratory: Negative.    Cardiovascular: Negative.   Gastrointestinal: Negative.   Musculoskeletal: Negative.   Psychiatric/Behavioral: Negative.     Per HPI unless specifically indicated above     Objective:    BP 130/83   Pulse 80   Temp 98.4 F (36.9 C)   Ht 5' 9"  (1.753 m)   Wt 224 lb 12.8 oz (102 kg)   SpO2 96%   BMI 33.20 kg/m   Wt Readings from Last 3 Encounters:  09/01/20  224 lb 12.8 oz (102 kg)  08/30/20 219 lb (99.3 kg)  08/21/20 216 lb (98 kg)    Physical Exam Vitals and nursing note reviewed.  Constitutional:      General: He is not in acute distress.    Appearance: Normal appearance. He is not ill-appearing, toxic-appearing or diaphoretic.  HENT:     Head: Normocephalic and atraumatic.     Right Ear: External ear normal.     Left Ear: External ear normal.     Nose: Nose normal.     Mouth/Throat:     Mouth: Mucous membranes are moist.     Pharynx: Oropharynx is clear.  Eyes:     General: No scleral icterus.       Right eye: No discharge.        Left eye: No discharge.     Extraocular Movements: Extraocular movements intact.     Conjunctiva/sclera: Conjunctivae normal.     Pupils: Pupils are equal, round, and reactive to light.  Cardiovascular:     Rate and Rhythm: Normal rate and regular rhythm.     Pulses: Normal pulses.     Heart sounds: Normal heart sounds. No murmur heard.   No friction rub. No gallop.  Pulmonary:     Effort: Pulmonary effort is normal. No respiratory distress.     Breath sounds: Normal breath sounds. No stridor. No wheezing, rhonchi or rales.  Chest:     Chest wall: No tenderness.  Musculoskeletal:        General: Normal range of motion.  Cervical back: Normal range of motion and neck supple.  Skin:    General: Skin is warm and dry.     Capillary Refill: Capillary refill takes less than 2 seconds.     Coloration: Skin is not jaundiced or pale.     Findings: No bruising, erythema, lesion or rash.  Neurological:     General: No focal deficit present.     Mental Status: He is alert and oriented to person, place, and time. Mental status is at baseline.  Psychiatric:        Mood and Affect: Mood normal.        Behavior: Behavior normal.        Thought Content: Thought content normal.        Judgment: Judgment normal.    Results for orders placed or performed in visit on 09/01/20  Comprehensive metabolic  panel  Result Value Ref Range   Glucose 87 65 - 99 mg/dL   BUN 10 6 - 24 mg/dL   Creatinine, Ser 0.89 0.76 - 1.27 mg/dL   eGFR 99 >59 mL/min/1.73   BUN/Creatinine Ratio 11 9 - 20   Sodium 144 134 - 144 mmol/L   Potassium 3.1 (L) 3.5 - 5.2 mmol/L   Chloride 100 96 - 106 mmol/L   CO2 28 20 - 29 mmol/L   Calcium 9.6 8.7 - 10.2 mg/dL   Total Protein 7.3 6.0 - 8.5 g/dL   Albumin 4.5 3.8 - 4.9 g/dL   Globulin, Total 2.8 1.5 - 4.5 g/dL   Albumin/Globulin Ratio 1.6 1.2 - 2.2   Bilirubin Total 0.3 0.0 - 1.2 mg/dL   Alkaline Phosphatase 137 (H) 44 - 121 IU/L   AST 28 0 - 40 IU/L   ALT 28 0 - 44 IU/L  Bayer DCA Hb A1c Waived  Result Value Ref Range   HB A1C (BAYER DCA - WAIVED) 6.1 <7.0 %  Lipid Panel w/o Chol/HDL Ratio  Result Value Ref Range   Cholesterol, Total 182 100 - 199 mg/dL   Triglycerides 399 (H) 0 - 149 mg/dL   HDL 28 (L) >39 mg/dL   VLDL Cholesterol Cal 66 (H) 5 - 40 mg/dL   LDL Chol Calc (NIH) 88 0 - 99 mg/dL      Assessment & Plan:   Problem List Items Addressed This Visit       Endocrine   Type 2 diabetes mellitus with stage 1 chronic kidney disease, without long-term current use of insulin ([place])    Doing great with A1c of 6.1. Continue current regimen. Continue to monitor. Call with any concerns. Refills given today.        Relevant Orders   Comprehensive metabolic panel (Completed)   Bayer DCA Hb A1c Waived (Completed)   Hyperlipidemia associated with type 2 diabetes mellitus ([place])    Under good control on current regimen. Continue current regimen. Continue to monitor. Call with any concerns. Refills given. Labs drawn today.         Relevant Orders   Comprehensive metabolic panel (Completed)   Lipid Panel w/o Chol/HDL Ratio (Completed)     Genitourinary   Benign hypertensive renal disease    Under good control on current regimen. Continue current regimen. Continue to monitor. Call with any concerns. Refills given. Labs drawn today.         Other  Visit Diagnoses     Essential hypertension    -  Primary   Relevant Medications   metoprolol succinate (TOPROL-XL) 100 MG  24 hr tablet   Other Relevant Orders   Comprehensive metabolic panel (Completed)        Follow up plan: Return in about 6 months (around 03/04/2021) for physical.

## 2020-09-02 ENCOUNTER — Encounter: Payer: Self-pay | Admitting: Oncology

## 2020-09-02 LAB — LIPID PANEL W/O CHOL/HDL RATIO
Cholesterol, Total: 182 mg/dL (ref 100–199)
HDL: 28 mg/dL — ABNORMAL LOW (ref >39)
LDL Chol Calc (NIH): 88 mg/dL (ref 0–99)
Triglycerides: 399 mg/dL — ABNORMAL HIGH (ref 0–149)
VLDL Cholesterol Cal: 66 mg/dL — ABNORMAL HIGH (ref 5–40)

## 2020-09-02 LAB — COMPREHENSIVE METABOLIC PANEL
ALT: 28 IU/L (ref 0–44)
AST: 28 IU/L (ref 0–40)
Albumin/Globulin Ratio: 1.6 (ref 1.2–2.2)
Albumin: 4.5 g/dL (ref 3.8–4.9)
Alkaline Phosphatase: 137 IU/L — ABNORMAL HIGH (ref 44–121)
BUN/Creatinine Ratio: 11 (ref 9–20)
BUN: 10 mg/dL (ref 6–24)
Bilirubin Total: 0.3 mg/dL (ref 0.0–1.2)
CO2: 28 mmol/L (ref 20–29)
Calcium: 9.6 mg/dL (ref 8.7–10.2)
Chloride: 100 mmol/L (ref 96–106)
Creatinine, Ser: 0.89 mg/dL (ref 0.76–1.27)
Globulin, Total: 2.8 g/dL (ref 1.5–4.5)
Glucose: 87 mg/dL (ref 65–99)
Potassium: 3.1 mmol/L — ABNORMAL LOW (ref 3.5–5.2)
Sodium: 144 mmol/L (ref 134–144)
Total Protein: 7.3 g/dL (ref 6.0–8.5)
eGFR: 99 mL/min/{1.73_m2} (ref >59)

## 2020-09-04 ENCOUNTER — Encounter: Payer: Self-pay | Admitting: Family Medicine

## 2020-09-04 ENCOUNTER — Other Ambulatory Visit: Payer: Self-pay | Admitting: Family Medicine

## 2020-09-04 NOTE — Assessment & Plan Note (Signed)
Doing great with A1c of 6.1. Continue current regimen. Continue to monitor. Call with any concerns. Refills given today.

## 2020-09-04 NOTE — Assessment & Plan Note (Signed)
Under good control on current regimen. Continue current regimen. Continue to monitor. Call with any concerns. Refills given. Labs drawn today.   

## 2020-09-07 ENCOUNTER — Telehealth: Payer: Self-pay | Admitting: *Deleted

## 2020-09-07 NOTE — Telephone Encounter (Signed)
   Telephone encounter was:  Unsuccessful.  09/07/2020 Name: Kristopher [name] MRN: [id number] DOB: 09-25-1960  Unsuccessful outbound call made today to assist with:  Food Insecurity  Outreach Attempt:  2nd Attempt  A HIPAA compliant voice message was left requesting a return call.  Instructed patient to call back at   Instructed patient to call back at [phone]  at their earliest convenience.   [name], Care Management  [phone] [address] , [place] [address] Email : [email or web address]

## 2020-09-11 ENCOUNTER — Telehealth: Payer: Self-pay | Admitting: *Deleted

## 2020-09-11 NOTE — Telephone Encounter (Signed)
   Telephone encounter was:  Unsuccessful.  09/11/2020 Name: Kristopher [name] MRN: [id number] DOB: 1960-07-26  Unsuccessful outbound call made today to assist with:  Financial Difficulties related to illness  Outreach Attempt:  3rd Attempt.  Referral closed unable to contact patient. I have attempted to contact this patient by phone, but line is busy.    [place], Care Management  [phone] [address] , [place] [address] Email : [email or web address]

## 2020-09-12 ENCOUNTER — Other Ambulatory Visit: Payer: Self-pay | Admitting: Family Medicine

## 2020-09-12 ENCOUNTER — Other Ambulatory Visit: Payer: Self-pay | Admitting: Oncology

## 2020-09-13 ENCOUNTER — Other Ambulatory Visit: Payer: Self-pay | Admitting: Family Medicine

## 2020-09-13 ENCOUNTER — Other Ambulatory Visit: Payer: Self-pay

## 2020-09-13 ENCOUNTER — Telehealth: Payer: Self-pay

## 2020-09-13 ENCOUNTER — Inpatient Hospital Stay (HOSPITAL_BASED_OUTPATIENT_CLINIC_OR_DEPARTMENT_OTHER): Payer: Medicare Other | Admitting: Nurse Practitioner

## 2020-09-13 ENCOUNTER — Encounter: Payer: Self-pay | Admitting: Nurse Practitioner

## 2020-09-13 ENCOUNTER — Inpatient Hospital Stay: Payer: Medicare Other | Attending: Oncology

## 2020-09-13 ENCOUNTER — Inpatient Hospital Stay: Payer: Medicare Other

## 2020-09-13 VITALS — BP 124/83 | HR 72 | Temp 97.1°F | Resp 16 | Wt 222.1 lb

## 2020-09-13 DIAGNOSIS — E119 Type 2 diabetes mellitus without complications: Secondary | ICD-10-CM | POA: Insufficient documentation

## 2020-09-13 DIAGNOSIS — Z5112 Encounter for antineoplastic immunotherapy: Secondary | ICD-10-CM | POA: Insufficient documentation

## 2020-09-13 DIAGNOSIS — Z8249 Family history of ischemic heart disease and other diseases of the circulatory system: Secondary | ICD-10-CM | POA: Insufficient documentation

## 2020-09-13 DIAGNOSIS — Z79899 Other long term (current) drug therapy: Secondary | ICD-10-CM | POA: Diagnosis not present

## 2020-09-13 DIAGNOSIS — C3492 Malignant neoplasm of unspecified part of left bronchus or lung: Secondary | ICD-10-CM

## 2020-09-13 DIAGNOSIS — Z809 Family history of malignant neoplasm, unspecified: Secondary | ICD-10-CM | POA: Diagnosis not present

## 2020-09-13 DIAGNOSIS — C3432 Malignant neoplasm of lower lobe, left bronchus or lung: Secondary | ICD-10-CM | POA: Insufficient documentation

## 2020-09-13 DIAGNOSIS — G8929 Other chronic pain: Secondary | ICD-10-CM | POA: Insufficient documentation

## 2020-09-13 DIAGNOSIS — F17210 Nicotine dependence, cigarettes, uncomplicated: Secondary | ICD-10-CM | POA: Insufficient documentation

## 2020-09-13 DIAGNOSIS — E876 Hypokalemia: Secondary | ICD-10-CM | POA: Insufficient documentation

## 2020-09-13 DIAGNOSIS — Z833 Family history of diabetes mellitus: Secondary | ICD-10-CM | POA: Insufficient documentation

## 2020-09-13 DIAGNOSIS — Z95828 Presence of other vascular implants and grafts: Secondary | ICD-10-CM

## 2020-09-13 LAB — COMPREHENSIVE METABOLIC PANEL
ALT: 43 U/L (ref 0–44)
AST: 38 U/L (ref 15–41)
Albumin: 4.4 g/dL (ref 3.5–5.0)
Alkaline Phosphatase: 125 U/L (ref 38–126)
Anion gap: 10 (ref 5–15)
BUN: 9 mg/dL (ref 6–20)
CO2: 25 mmol/L (ref 22–32)
Calcium: 8.9 mg/dL (ref 8.9–10.3)
Chloride: 102 mmol/L (ref 98–111)
Creatinine, Ser: 0.84 mg/dL (ref 0.61–1.24)
GFR, Estimated: >60 mL/min (ref >60)
Glucose, Bld: 117 mg/dL — ABNORMAL HIGH (ref 70–99)
Potassium: 3.3 mmol/L — ABNORMAL LOW (ref 3.5–5.1)
Sodium: 137 mmol/L (ref 135–145)
Total Bilirubin: 0.5 mg/dL (ref 0.3–1.2)
Total Protein: 7.6 g/dL (ref 6.5–8.1)

## 2020-09-13 LAB — CBC WITH DIFFERENTIAL/PLATELET
Abs Immature Granulocytes: 0.04 10*3/uL (ref 0.00–0.07)
Basophils Absolute: 0.1 10*3/uL (ref 0.0–0.1)
Basophils Relative: 1 %
Eosinophils Absolute: 0.1 10*3/uL (ref 0.0–0.5)
Eosinophils Relative: 3 %
HCT: 40.8 % (ref 39.0–52.0)
Hemoglobin: 13.7 g/dL (ref 13.0–17.0)
Immature Granulocytes: 1 %
Lymphocytes Relative: 29 %
Lymphs Abs: 1.5 10*3/uL (ref 0.7–4.0)
MCH: 30.3 pg (ref 26.0–34.0)
MCHC: 33.6 g/dL (ref 30.0–36.0)
MCV: 90.3 fL (ref 80.0–100.0)
Monocytes Absolute: 0.5 10*3/uL (ref 0.1–1.0)
Monocytes Relative: 9 %
Neutro Abs: 3 10*3/uL (ref 1.7–7.7)
Neutrophils Relative %: 57 %
Platelets: 187 10*3/uL (ref 150–400)
RBC: 4.52 MIL/uL (ref 4.22–5.81)
RDW: 13.3 % (ref 11.5–15.5)
WBC: 5.1 10*3/uL (ref 4.0–10.5)
nRBC: 0 % (ref 0.0–0.2)

## 2020-09-13 LAB — CBC
HCT: 41.1 % (ref 39.0–52.0)
Hemoglobin: 13.6 g/dL (ref 13.0–17.0)
MCH: 29.8 pg (ref 26.0–34.0)
MCHC: 33.1 g/dL (ref 30.0–36.0)
MCV: 89.9 fL (ref 80.0–100.0)
Platelets: 183 10*3/uL (ref 150–400)
RBC: 4.57 MIL/uL (ref 4.22–5.81)
RDW: 13.2 % (ref 11.5–15.5)
WBC: 5.1 10*3/uL (ref 4.0–10.5)
nRBC: 0 % (ref 0.0–0.2)

## 2020-09-13 LAB — TSH: TSH: 1.006 u[IU]/mL (ref 0.350–4.500)

## 2020-09-13 MED ORDER — HEPARIN SOD (PORK) LOCK FLUSH 100 UNIT/ML IV SOLN
500.0000 [IU] | Freq: Once | INTRAVENOUS | Status: AC | PRN
  Administered 2020-09-13: 500 [IU]
  Filled 2020-09-13: qty 5

## 2020-09-13 MED ORDER — SODIUM CHLORIDE 0.9 % IV SOLN
10.0000 mg/kg | Freq: Once | INTRAVENOUS | Status: AC
  Administered 2020-09-13: 1000 mg via INTRAVENOUS
  Filled 2020-09-13: qty 20

## 2020-09-13 MED ORDER — HEPARIN SOD (PORK) LOCK FLUSH 100 UNIT/ML IV SOLN
INTRAVENOUS | Status: AC
  Filled 2020-09-13: qty 5

## 2020-09-13 MED ORDER — POTASSIUM CHLORIDE ER 20 MEQ PO TBCR: 1.0000 | EXTENDED_RELEASE_TABLET | Freq: Every day | ORAL | 1 refills | Status: DC

## 2020-09-13 MED ORDER — SODIUM CHLORIDE 0.9% FLUSH
10.0000 mL | INTRAVENOUS | Status: DC | PRN
  Filled 2020-09-13: qty 10

## 2020-09-13 MED ORDER — SODIUM CHLORIDE 0.9% FLUSH
10.0000 mL | Freq: Once | INTRAVENOUS | Status: AC
  Administered 2020-09-13: 10 mL via INTRAVENOUS
  Filled 2020-09-13: qty 10

## 2020-09-13 MED ORDER — SODIUM CHLORIDE 0.9 % IV SOLN
Freq: Once | INTRAVENOUS | Status: AC
  Filled 2020-09-13: qty 250

## 2020-09-13 NOTE — Telephone Encounter (Signed)
Copied from [facility] [id number]. Topic: General - Other >> Sep 13, 2020  3:47 PM [place] Quan A wrote: Reason for CRM: Patient wife called in to request refill from Dr Wynetta Emery on medication for patient also included Lidoderm Patch and Sumatriptan but did not see it on medication list please advise

## 2020-09-13 NOTE — Telephone Encounter (Signed)
Patient has both Toprol XL 100 mg and Metoprolol 50 mg on profile, both are the same medication, unsure of which patient is supposed to be taking. Will route to the office for provider clarification, patient will need to be notified.  Hydralazine already refilled on yesterday by provider.

## 2020-09-13 NOTE — Telephone Encounter (Signed)
Requested medication (s) are due for refill today: Yes  Requested medication (s) are on the active medication list: Yes  Last refill:  N/A  Future visit scheduled: Yes  Notes to clinic:  Unable to refill per protocol, both medications are the same, unsure which the provider wants the patient to take, see notes, patient requested refills, will need notifying when provider decides     Requested Prescriptions  Pending Prescriptions Disp Refills   metoprolol succinate (TOPROL-XL) 100 MG 24 hr tablet      Sig: Take with or immediately following a meal.      Cardiovascular:  Beta Blockers Passed - 09/13/2020  4:07 PM      Passed - Last BP in normal range    BP Readings from Last 1 Encounters:  09/13/20 124/83          Passed - Last Heart Rate in normal range    Pulse Readings from Last 1 Encounters:  09/13/20 72          Passed - Valid encounter within last 6 months    Recent Outpatient Visits           1 week ago Essential hypertension   [place], [name], DO   3 months ago Type 2 diabetes mellitus with stage 1 chronic kidney disease, without long-term current use of insulin ([place])   [place], [name], DO   4 months ago Viral upper respiratory tract infection   [facility] Jon Billings, NP   6 months ago Type 2 diabetes mellitus with stage 1 chronic kidney disease, without long-term current use of insulin ([place])   [place], [name], DO   9 months ago Type 2 diabetes mellitus with stage 1 chronic kidney disease, without long-term current use of insulin ([place])   [place], [place], DO       Future Appointments             In 5 months Johnson, [name], DO [name]   In 11 months  [facility], [name]               metoprolol tartrate (LOPRESSOR) 50 MG tablet 180 tablet 1    Sig: Take 1 tablet (50 mg total) by mouth 2 (two)  times daily.      Cardiovascular:  Beta Blockers Passed - 09/13/2020  4:07 PM      Passed - Last BP in normal range    BP Readings from Last 1 Encounters:  09/13/20 124/83          Passed - Last Heart Rate in normal range    Pulse Readings from Last 1 Encounters:  09/13/20 72          Passed - Valid encounter within last 6 months    Recent Outpatient Visits           1 week ago Essential hypertension   [place], [name], DO   3 months ago Type 2 diabetes mellitus with stage 1 chronic kidney disease, without long-term current use of insulin ([place])   [place], [name], DO   4 months ago Viral upper respiratory tract infection   [facility] Jon Billings, NP   6 months ago Type 2 diabetes mellitus with stage 1 chronic kidney disease, without long-term current use of insulin ([place])   [place], [place],  DO   9 months ago Type 2 diabetes mellitus with stage 1 chronic kidney disease, without long-term current use of insulin ([place])   [facility] [place], [place], DO       Future Appointments             In 5 months Johnson, [name], DO [name]   In 11 months  [facility], [name]              Signed Prescriptions Disp Refills   Potassium Chloride ER 20 MEQ TBCR 90 tablet 1    Sig: Take 1 tablet by mouth daily.      Endocrinology:  Minerals - Potassium Supplementation Failed - 09/13/2020  4:07 PM      Failed - K in normal range and within 360 days    Potassium  Date Value Ref Range Status  09/13/2020 3.3 (L) 3.5 - 5.1 mmol/L Final          Passed - Cr in normal range and within 360 days    Creatinine, Ser  Date Value Ref Range Status  09/13/2020 0.84 0.61 - 1.24 mg/dL Final          Passed - Valid encounter within last 12 months    Recent Outpatient Visits           1 week ago Essential hypertension   [place], [name], DO   3 months ago Type 2 diabetes mellitus with stage 1 chronic kidney disease, without long-term current use of insulin ([place])   [place], [name], DO   4 months ago Viral upper respiratory tract infection   [facility] Jon Billings, NP   6 months ago Type 2 diabetes mellitus with stage 1 chronic kidney disease, without long-term current use of insulin ([place])   [place], [name], DO   9 months ago Type 2 diabetes mellitus with stage 1 chronic kidney disease, without long-term current use of insulin ([place])   [place], [place], DO       Future Appointments             In 5 months Johnson, Barb Merino, DO [facility], [name]   In 37 months  [facility], [name]

## 2020-09-13 NOTE — Progress Notes (Signed)
[place]  Telephone:([phone]) [phone] [phone]) [phone]  ID: [name] OB: 1960-11-17  MR#: [id number]  IZT#:[id number]  Patient Care Team: [name], DO as PCP - General (Family Medicine) Gavin Pound, [place] (Inactive) as Certified Medical Assistant Telford Nab, RN as Registered Nurse Vanita Ingles, RN as Case Manager (General Practice) [name], MD as Referring Physician (Pain Medicine) [name], MD as Consulting Physician (Oncology)  CHIEF COMPLAINT: Clinical stage IIIa squamous cell 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  INTERVAL HISTORY: Patient returns to clinic today for further evaluation and consideration of cycle 18 of maintenance durvalumab. He continues to tolerate his treatments well without significant side effects.  He currently feels well and is asymptomatic.  He denies any acute symptoms.  He continues to work. Denies neurologic symptoms. No new rashes or itching. No shortness of breath or chest pain. No urinary symptoms. No new lumps or bumps. No other significant complaints.    REVIEW OF SYSTEMS:   Review of Systems  Constitutional: Negative.  Negative for fever, malaise/fatigue and weight loss.  Respiratory: Negative.  Negative for cough and shortness of breath.   Cardiovascular: Negative.  Negative for chest pain and leg swelling.  Gastrointestinal: Negative.  Negative for abdominal pain and heartburn.  Genitourinary: Negative.  Negative for dysuria and flank pain.  Musculoskeletal: Negative.  Negative for back pain.  Skin: Negative.  Negative for rash.  Neurological: Negative.  Negative for dizziness, focal weakness, weakness and headaches.  Psychiatric/Behavioral: Negative.  The patient is not nervous/anxious.   As per HPI. Otherwise, a complete review of systems is negative.  PAST MEDICAL HISTORY: Past Medical History:  Diagnosis Date   Allergy    Arthritis    left foot   Benign hypertensive  kidney disease    Chronic back pain    Four rods in back   Diabetes mellitus, type 2 (HCC)    Dyspnea    GERD (gastroesophageal reflux disease)    Hypertension    Malignant neoplasm of lung (HCC)    Migraines    daily    PAST SURGICAL HISTORY: Past Surgical History:  Procedure Laterality Date   APPENDECTOMY     BACK SURGERY     COLONOSCOPY WITH PROPOFOL N/A 02/19/2016   Procedure: COLONOSCOPY WITH PROPOFOL;  Surgeon: Lucilla Lame, MD;  Location: [facility];  Service: Endoscopy;  Laterality: N/A;   COLONOSCOPY WITH PROPOFOL N/A 01/19/2018   Procedure: COLONOSCOPY WITH PROPOFOL;  Surgeon: Lucilla Lame, MD;  Location: [facility];  Service: Endoscopy;  Laterality: N/A;  Diabetic - oral meds   DG OPERATIVE LEFT HIP ([place] HX)     10/19   ELECTROMAGNETIC NAVIGATION BROCHOSCOPY Left 11/18/2018   Procedure: ELECTROMAGNETIC NAVIGATION BRONCHOSCOPY;  Surgeon: Tyler Pita, MD;  Location: [facility];  Service: Cardiopulmonary;  Laterality: Left;   FLEXIBLE BRONCHOSCOPY Bilateral 01/20/2019   Procedure: FLEXIBLE BRONCHOSCOPY;  Surgeon: Ottie Glazier, MD;  Location: [facility];  Service: Thoracic;  Laterality: Bilateral;   FLEXIBLE BRONCHOSCOPY Bilateral 01/22/2019   Procedure: FLEXIBLE BRONCHOSCOPY;  Surgeon: Ottie Glazier, MD;  Location: [facility];  Service: Thoracic;  Laterality: Bilateral;   FOOT SURGERY Left    Screws and plates   JOINT REPLACEMENT Left 12/2017   DR Rudene Christians Hip   KNEE SURGERY Left    X 2   LEG SURGERY     LUNG CANCER SURGERY     POLYPECTOMY N/A 02/19/2016   Procedure: POLYPECTOMY;  Surgeon: Lucilla Lame, MD;  Location: [facility];  Service: Endoscopy;  Laterality: N/A;   POLYPECTOMY  01/19/2018   Procedure: POLYPECTOMY;  Surgeon: Lucilla Lame, MD;  Location: [facility];  Service: Endoscopy;;   PORTACATH PLACEMENT N/A 11/11/2019   Procedure: INSERTION PORT-A-CATH;  Surgeon: Nestor Lewandowsky, MD;  Location: [facility];  Service: General;   Laterality: N/A;   THORACOTOMY Left 01/14/2019   Procedure: THORACOTOMY MAJOR, LEFT;  Surgeon: Nestor Lewandowsky, MD;  Location: [facility];  Service: General;  Laterality: Left;   TOTAL HIP ARTHROPLASTY Left 12/02/2017   Procedure: TOTAL HIP ARTHROPLASTY ANTERIOR APPROACH;  Surgeon: Hessie Knows, MD;  Location: [facility];  Service: Orthopedics;  Laterality: Left;   VIDEO BRONCHOSCOPY Left 01/14/2019   Procedure: VIDEO BRONCHOSCOPY WITH FLUORO, LEFT;  Surgeon: Nestor Lewandowsky, MD;  Location: [facility];  Service: General;  Laterality: Left;   VIDEO BRONCHOSCOPY WITH ENDOBRONCHIAL NAVIGATION N/A 10/15/2019   Procedure: VIDEO BRONCHOSCOPY WITH ENDOBRONCHIAL NAVIGATION;  Surgeon: Ottie Glazier, MD;  Location: [facility];  Service: Thoracic;  Laterality: N/A;   VIDEO BRONCHOSCOPY WITH ENDOBRONCHIAL ULTRASOUND N/A 10/15/2019   Procedure: VIDEO BRONCHOSCOPY WITH ENDOBRONCHIAL ULTRASOUND;  Surgeon: Ottie Glazier, MD;  Location: [facility];  Service: Thoracic;  Laterality: N/A;    FAMILY HISTORY: Family History  Problem Relation Age of Onset   Cancer Father    Diabetes Sister    Thrombosis Sister     ADVANCED DIRECTIVES (Y/N):  N  HEALTH MAINTENANCE: Social History   Tobacco Use   Smoking status: Every Day    Packs/day: 0.25    Years: 35.00    Pack years: 8.75    Types: Cigarettes    Last attempt to quit: 01/14/2019    Years since quitting: 1.6   Smokeless tobacco: Never  Vaping Use   Vaping Use: Never used  Substance Use Topics   Alcohol use: No    Alcohol/week: 0.0 standard drinks   Drug use: Yes    Types: Morphine    Comment: prescribed     Colonoscopy:   Bone density:  Lipid panel:  Allergies  Allergen Reactions   Acetaminophen Swelling   Aspirin Anaphylaxis   Epinephrine Anaphylaxis    Does not include albuterol   Novocain [Procaine] Anaphylaxis   Penicillins Anaphylaxis    Has patient had a PCN reaction causing immediate rash, facial/tongue/throat swelling, SOB or  lightheadedness with hypotension: Yes Has patient had a PCN reaction causing severe rash involving mucus membranes or skin necrosis: No Has patient had a PCN reaction that required hospitalization: Yes Has patient had a PCN reaction occurring within the last 10 years: No If all of the above answers are "NO", then may proceed with Cephalosporin use.    Strawberry Extract Anaphylaxis   Shellfish Allergy Hives and Nausea And Vomiting    Current Outpatient Medications  Medication Sig Dispense Refill   Accu-Chek FastClix Lancets MISC USE TO TEST BLOOD SUGAR 2X A DAY 100 each 12   albuterol (VENTOLIN HFA) 108 (90 Base) MCG/ACT inhaler Inhale into the lungs.     amLODipine (NORVASC) 5 MG tablet Take 1 tablet (5 mg total) by mouth 2 (two) times daily. 180 tablet 1   atorvastatin (LIPITOR) 10 MG tablet Take 1 tablet (10 mg total) by mouth daily at 6 PM. 90 tablet 1   BREZTRI AEROSPHERE 160-9-4.8 MCG/ACT AERO Inhale 2 puffs into the lungs 2 (two) times daily. Maintenance per pulmonology     diphenhydrAMINE (BENADRYL) 25 MG tablet Take 50 mg by mouth daily.  esomeprazole (NEXIUM) 40 MG capsule Take 1 capsule (40 mg total) by mouth daily. 90 capsule 3   glucose blood (ACCU-CHEK GUIDE) test strip USE TO TEST BLOOD SUGAR 2X A DAY 100 strip 3   hydrALAZINE (APRESOLINE) 100 MG tablet TAKE 1 TABLET(100 MG) BY MOUTH TWICE DAILY 180 tablet 1   JARDIANCE 25 MG TABS tablet TAKE 1 TABLET(25 MG) BY MOUTH DAILY BEFORE BREAKFAST 90 tablet 0   lidocaine (LIDODERM) 5 % Place 1 patch onto the skin daily. Remove & Discard patch within 12 hours or as directed by MD (Patient taking differently: Place 1 patch onto the skin daily as needed (pain). Remove & Discard patch within 12 hours or as directed by MD) 30 patch 12   lidocaine-prilocaine (EMLA) cream APPLY A SMALL AMOUNT TO PORT SITE AT LEAST 1 HOUR PRIOR TO IT BEING ACCESSED, COVER WITH PLASTIC WRAP 30 g 1   metFORMIN (GLUCOPHAGE) 500 MG tablet Take 2 tablets (1,000  mg total) by mouth 2 (two) times daily with a meal. 360 tablet 1   metoprolol succinate (TOPROL-XL) 100 MG 24 hr tablet SMARTSIG:2 Tablet(s) By Mouth Morning-Night     metoprolol tartrate (LOPRESSOR) 50 MG tablet Take 1 tablet (50 mg total) by mouth 2 (two) times daily. 180 tablet 1   montelukast (SINGULAIR) 10 MG tablet Take 10 mg by mouth daily.     morphine (MSIR) 15 MG tablet Take 1 tablet (15 mg total) by mouth every 6 (six) hours as needed for moderate pain or severe pain. Must last 30 days. 120 tablet 0   [START ON 09/21/2020] morphine (MSIR) 15 MG tablet Take 1 tablet (15 mg total) by mouth every 6 (six) hours as needed for moderate pain or severe pain. Must last 30 days. 120 tablet 0   [START ON 10/21/2020] morphine (MSIR) 15 MG tablet Take 1 tablet (15 mg total) by mouth every 6 (six) hours as needed for moderate pain or severe pain. Must last 30 days. 120 tablet 0   Multiple Vitamin (MULTIVITAMIN WITH MINERALS) TABS tablet Take 1 tablet by mouth daily.     NONFORMULARY OR COMPOUNDED ITEM Apply 1-2 mLs topically 4 (four) times daily as needed. 10% Ketamine/2% Cyclobenzaprine/6% Gabapentin Cream 240 each PRN   nortriptyline (PAMELOR) 25 MG capsule Take 2 capsules (50 mg total) by mouth at bedtime. 180 capsule 1   Potassium Chloride ER 20 MEQ TBCR TAKE 1 TABLET BY MOUTH EVERY DAY 90 tablet 1   STIOLTO RESPIMAT 2.5-2.5 MCG/ACT AERS Inhale 2 puffs into the lungs 4 (four) times daily as needed. Prn per pulmonology notes     SUMAtriptan (IMITREX) 50 MG tablet Take 1 tablet (50 mg total) by mouth every 2 (two) hours as needed for migraine. Take 1 tab at onset of migraine. May repeat in 2 hours if headache persists or recurs. 10 tablet 12   No current facility-administered medications for this visit.   Facility-Administered Medications Ordered in Other Visits  Medication Dose Route Frequency Provider Last Rate Last Admin   0.9 %  sodium chloride infusion    Continuous PRN Dionne Bucy, CRNA    New Bag at 01/22/19 0705   sodium chloride flush (NS) 0.9 % injection 10 mL  10 mL Intracatheter PRN [name], MD   10 mL at 03/01/20 0953    OBJECTIVE: Vitals:   09/13/20 0910  BP: 124/83  Pulse: 72  Resp: 16  Temp: (!) 97.1 F (36.2 C)     Body mass index  is 32.8 kg/m.     ECOG FS:0 - Asymptomatic  General: Well-developed, well-nourished, no acute distress. Eyes: Pink conjunctiva, anicteric sclera. Lungs: Clear to auscultation bilaterally.  No audible wheezing or coughing Heart: Regular rate and rhythm.  Abdomen: Soft, nontender, nondistended.  Musculoskeletal: No edema, cyanosis, or clubbing. Neuro: Alert, answering all questions appropriately. Cranial nerves grossly intact. Skin: No rashes or petechiae noted. Psych: Normal affect.  LAB RESULTS:  Lab Results  Component Value Date   NA 137 09/13/2020   K 3.3 (L) 09/13/2020   CL 102 09/13/2020   CO2 25 09/13/2020   GLUCOSE 117 (H) 09/13/2020   BUN 9 09/13/2020   CREATININE 0.84 09/13/2020   CALCIUM 8.9 09/13/2020   PROT 7.6 09/13/2020   ALBUMIN 4.4 09/13/2020   AST 38 09/13/2020   ALT 43 09/13/2020   ALKPHOS 125 09/13/2020   BILITOT 0.5 09/13/2020   GFRNONAA >60 09/13/2020   GFRAA >60 12/01/2019    Lab Results  Component Value Date   WBC 5.1 09/13/2020   NEUTROABS 3.1 08/16/2020   HGB 13.6 09/13/2020   HCT 41.1 09/13/2020   MCV 89.9 09/13/2020   PLT 183 09/13/2020     STUDIES: No results found.  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  PLAN:    Clinical stage IIIa squamous cell carcinoma of the left lower lobe lung- s/p lung resection on 19/62,2297 with complicated post op course. CT from June 12, 21 showed suspicious subcarinal lymph node measuring 1.4 cm. Follow up pet showed lymph node was hypermetabolic suspicious for recurrent disease. Biopsy confirmed. He completed concurrent XRT and weekly carbo-taxol on 01/05/20. He is now s/p 18 cycles  of maintenance durvalumab, started 01/19/20. Interval CT on 06/19/20 showed resolution of mediastinal lymphadenopathy and no new or progressive findings. Plan to repeat CT in October 2022. Labs today reviewed and acceptable for continuation of treatment.   Pain- chronic. Unrelated to malignancy. Treatment per pain clinic.   Nausea: - resolved. Likely related to chemotherapy.   Hypokalemia: K 3.3 today. Continue oral KCl daily.   Disposition: Treatment today RTC in 2 weeks for treatment only, cycle 18,  rtc in 4 weeks for lab (cbc, cmp, t4, tsh), MD/Finn, durvalumab  Patient expressed understanding and was in agreement with this plan. He also understands that He can call clinic at any time with any questions, concerns, or complaints.   Cancer Staging Squamous cell carcinoma of left lung [facility]) Staging form: Lung, AJCC 8th Edition - Clinical stage from 02/12/2019: Stage IIIA (cT1b, cN2, cM0) - Signed by [name], MD on 11/01/2019   Verlon Au, NP   09/13/2020 9:26 AM

## 2020-09-13 NOTE — Progress Notes (Signed)
Patient denies new problems/concerns today.   °

## 2020-09-13 NOTE — Telephone Encounter (Signed)
Copied from [place] [phone]. Topic: Quick Communication - Rx Refill/Question >> Sep 13, 2020  3:36 PM Leward Quan A wrote: Medication: metoprolol succinate (TOPROL-XL) 100 MG 24 hr tablet, Potassium Chloride ER 20 MEQ TBCR, metoprolol tartrate (LOPRESSOR) 50 MG tablet, hydrALAZINE (APRESOLINE) 100 MG tablet,   Has the patient contacted their pharmacy? Yes.   (Agent: If no, request that the patient contact the pharmacy for the refill.) (Agent: If yes, when and what did the pharmacy advise?)  Preferred Pharmacy (with phone number or street name): [id number] [name], [place]  Phone:  [phone] Fax:  [phone]     Agent: Please be advised that RX refills may take up to 3 business days. We ask that you follow-up with your pharmacy.

## 2020-09-13 NOTE — Patient Instructions (Addendum)
Durvalumab injection What is this medication? DURVALUMAB (dur VAL ue mab) is a monoclonal antibody. It is used to treat lungcancer. This medicine may be used for other purposes; ask your health care provider orpharmacist if you have questions. COMMON BRAND NAME(S): [id number] What should I tell my care team before I take this medication? They need to know if you have any of these conditions: autoimmune diseases like Crohn's disease, ulcerative colitis, or lupus have had or planning to have an allogeneic stem cell transplant (uses someone else's stem cells) history of organ transplant history of radiation to the chest nervous system problems like myasthenia gravis or Guillain-Barre syndrome an unusual or allergic reaction to durvalumab, other medicines, foods, dyes, or preservatives pregnant or trying to get pregnant breast-feeding How should I use this medication? This medicine is for infusion into a vein. It is given by a health careprofessional in a hospital or clinic setting. A special MedGuide will be given to you before each treatment. Be sure to readthis information carefully each time. Talk to your pediatrician regarding the use of this medicine in children.Special care may be needed. Overdosage: If you think you have taken too much of this medicine contact apoison control center or emergency room at once. NOTE: This medicine is only for you. Do not share this medicine with others. What if I miss a dose? It is important not to miss your dose. Call your doctor or health careprofessional if you are unable to keep an appointment. What may interact with this medication? Interactions have not been studied. This list may not describe all possible interactions. Give your health care provider a list of all the medicines, herbs, non-prescription drugs, or dietary supplements you use. Also tell them if you smoke, drink alcohol, or use illegaldrugs. Some items may interact with your medicine. What  should I watch for while using this medication? This drug may make you feel generally unwell. Continue your course of treatmenteven though you feel ill unless your doctor tells you to stop. You may need blood work done while you are taking this medicine. Do not become pregnant while taking this medicine or for 3 months after stopping it. Women should inform their doctor if they wish to become pregnant or think they might be pregnant. There is a potential for serious side effects to an unborn child. Talk to your health care professional or pharmacist for more information. Do not breast-feed an infant while taking this medicine orfor 3 months after stopping it. What side effects may I notice from receiving this medication? Side effects that you should report to your doctor or health care professionalas soon as possible: allergic reactions like skin rash, itching or hives, swelling of the face, lips, or tongue black, tarry stools bloody or watery diarrhea breathing problems change in emotions or moods change in sex drive changes in vision chest pain or chest tightness chills confusion cough facial flushing fever headache signs and symptoms of high blood sugar such as dizziness; dry mouth; dry skin; fruity breath; nausea; stomach pain; increased hunger or thirst; increased urination signs and symptoms of liver injury like dark yellow or brown urine; general ill feeling or flu-like symptoms; light-colored stools; loss of appetite; nausea; right upper belly pain; unusually weak or tired; yellowing of the eyes or skin stomach pain trouble passing urine or change in the amount of urine weight gain or weight loss Side effects that usually do not require medical attention (report these toyour doctor or health care professional if they  continue or are bothersome): bone pain constipation loss of appetite muscle pain nausea swelling of the ankles, feet, hands tiredness This list may not describe  all possible side effects. Call your doctor for medical advice about side effects. You may report side effects to FDA at1-800-FDA-1088. Where should I keep my medication? This drug is given in a hospital or clinic and will not be stored at home. NOTE: This sheet is a summary. It may not cover all possible information. If you have questions about this medicine, talk to your doctor, pharmacist, orhealth care provider.  2022 Elsevier/Gold Standard (2019-04-29 13:01:29)  [facility]  Discharge Instructions: Thank you for choosing [facility] to provide your oncology and hematology care.  If you have a lab appointment with the [facility], please go directly to the [facility] and check in at the registration area.  Wear comfortable clothing and clothing appropriate for easy access to any Portacath or PICC line.   We strive to give you quality time with your provider. You may need to reschedule your appointment if you arrive late (15 or more minutes).  Arriving late affects you and other patients whose appointments are after yours.  Also, if you miss three or more appointments without notifying the office, you may be dismissed from the clinic at the provider's discretion.      For prescription refill requests, have your pharmacy contact our office and allow 72 hours for refills to be completed.    Today you received the following chemotherapy and/or immunotherapy agents - durvalumab      To help prevent nausea and vomiting after your treatment, we encourage you to take your nausea medication as directed.  BELOW ARE SYMPTOMS THAT SHOULD BE REPORTED IMMEDIATELY: *FEVER GREATER THAN 100.4 F (38 C) OR HIGHER *CHILLS OR SWEATING *NAUSEA AND VOMITING THAT IS NOT CONTROLLED WITH YOUR NAUSEA MEDICATION *UNUSUAL SHORTNESS OF BREATH *UNUSUAL BRUISING OR BLEEDING *URINARY PROBLEMS (pain or burning when urinating, or frequent urination) *BOWEL  PROBLEMS (unusual diarrhea, constipation, pain near the anus) TENDERNESS IN MOUTH AND THROAT WITH OR WITHOUT PRESENCE OF ULCERS (sore throat, sores in mouth, or a toothache) UNUSUAL RASH, SWELLING OR PAIN  UNUSUAL VAGINAL DISCHARGE OR ITCHING   Items with * indicate a potential emergency and should be followed up as soon as possible or go to the Emergency Department if any problems should occur.  Please show the CHEMOTHERAPY ALERT CARD or IMMUNOTHERAPY ALERT CARD at check-in to the Emergency Department and triage nurse.  Should you have questions after your visit or need to cancel or reschedule your appointment, please contact [facility]  [phone] and follow the prompts.  Office hours are 8:00 a.m. to 4:30 p.m. Monday - Friday. Please note that voicemails left after 4:00 p.m. may not be returned until the following business day.  We are closed weekends and major holidays. You have access to a nurse at all times for urgent questions. Please call the main number to the clinic [phone] and follow the prompts.  For any non-urgent questions, you may also contact your provider using MyChart. We now offer e-Visits for anyone 67 and older to request care online for non-urgent symptoms. For details visit [email or web address].   Also download the MyChart app! Go to the app store, search "MyChart", open the app, select [facility], and log in with your MyChart username and password.  Due to Covid, a mask is required upon entering the hospital/clinic.  If you do not have a mask, one will be given to you upon arrival. For doctor visits, patients may have 1 support person aged 35 or older with them. For treatment visits, patients cannot have anyone with them due to current Covid guidelines and our immunocompromised population.

## 2020-09-14 ENCOUNTER — Other Ambulatory Visit: Payer: Self-pay

## 2020-09-14 LAB — T4: T4, Total: 8.4 ug/dL (ref 4.5–12.0)

## 2020-09-14 MED ORDER — LIDOCAINE 5 % EX PTCH: 1.0000 | MEDICATED_PATCH | CUTANEOUS | 12 refills | Status: DC

## 2020-09-14 MED ORDER — SUMATRIPTAN SUCCINATE 50 MG PO TABS: 50.0000 mg | ORAL_TABLET | ORAL | 12 refills | Status: DC | PRN

## 2020-09-14 NOTE — Telephone Encounter (Signed)
He should not be taking both- please find out which he has been taking and cancel the other one at the pharmacy. Thanks!

## 2020-09-14 NOTE — Telephone Encounter (Signed)
Initiated refill request and sent to provider.

## 2020-09-14 NOTE — Telephone Encounter (Signed)
Pt next apt on 03/06/2021 last apt was on 09/01/2020

## 2020-09-18 NOTE — Telephone Encounter (Signed)
Please see below.

## 2020-09-19 NOTE — Telephone Encounter (Signed)
Can you check on this?

## 2020-09-20 ENCOUNTER — Telehealth: Payer: Self-pay | Admitting: Family Medicine

## 2020-09-20 NOTE — Telephone Encounter (Signed)
Pharmacy advised patient to d/c a medication and caller thinks it was  metoprolol succinate (TOPROL-XL) 100 MG 24 hr tablet. Caller would like a follow up call due to patient being confused regarding pharmacy instructions. Best #  [phone]

## 2020-09-22 ENCOUNTER — Ambulatory Visit (INDEPENDENT_AMBULATORY_CARE_PROVIDER_SITE_OTHER): Payer: Medicare Other | Admitting: General Practice

## 2020-09-22 ENCOUNTER — Telehealth: Payer: Medicare Other | Admitting: General Practice

## 2020-09-22 DIAGNOSIS — C3490 Malignant neoplasm of unspecified part of unspecified bronchus or lung: Secondary | ICD-10-CM

## 2020-09-22 DIAGNOSIS — I10 Essential (primary) hypertension: Secondary | ICD-10-CM | POA: Diagnosis not present

## 2020-09-22 MED ORDER — METOPROLOL TARTRATE 50 MG PO TABS: 50.0000 mg | ORAL_TABLET | Freq: Two times a day (BID) | ORAL | 1 refills | Status: DC

## 2020-09-22 MED ORDER — METOPROLOL SUCCINATE ER 100 MG PO TB24: 100.0000 mg | ORAL_TABLET | Freq: Two times a day (BID) | ORAL | 1 refills | Status: DC

## 2020-09-22 NOTE — Telephone Encounter (Signed)
Please advise which dosage patient should be taking of metoprolol. Patient is confused about which one he should be taking.

## 2020-09-22 NOTE — Telephone Encounter (Signed)
Please find out what dose of metoprolol he has been taking. We got a refill request for 2 different doses, and he should only be taking 1

## 2020-09-22 NOTE — Chronic Care Management (AMB) (Signed)
Chronic Care Management   CCM RN Visit Note  09/22/2020 Name: Kristopher [name] MRN: [id number] DOB: [birth year]  Subjective: Kristopher [name] is a 60 y.o. year old male who is a primary care patient of [name], DO. The care management team was consulted for assistance with disease management and care coordination needs.    Engaged with patient by telephone for follow up visit in response to provider referral for case management and/or care coordination services.   Consent to Services:  The patient was given information about Chronic Care Management services, agreed to services, and gave verbal consent prior to initiation of services.  Please see initial visit note for detailed documentation.   Patient agreed to services and verbal consent obtained.   Assessment: Review of patient past medical history, allergies, medications, health status, including review of consultants reports, laboratory and other test data, was performed as part of comprehensive evaluation and provision of chronic care management services.   SDOH (Social Determinants of Health) assessments and interventions performed:    CCM Care Plan  Allergies  Allergen Reactions   Acetaminophen Swelling   Aspirin Anaphylaxis   Epinephrine Anaphylaxis    Does not include albuterol   Novocain [Procaine] Anaphylaxis   Penicillins Anaphylaxis    Has patient had a PCN reaction causing immediate rash, facial/tongue/throat swelling, SOB or lightheadedness with hypotension: Yes Has patient had a PCN reaction causing severe rash involving mucus membranes or skin necrosis: No Has patient had a PCN reaction that required hospitalization: Yes Has patient had a PCN reaction occurring within the last 10 years: No If all of the above answers are "NO", then may proceed with Cephalosporin use.    Strawberry Extract Anaphylaxis   Shellfish Allergy Hives and Nausea And Vomiting    Outpatient Encounter Medications as of 09/22/2020   Medication Sig Note   Accu-Chek FastClix Lancets MISC USE TO TEST BLOOD SUGAR 2X A DAY    albuterol (VENTOLIN HFA) 108 (90 Base) MCG/ACT inhaler Inhale into the lungs.    amLODipine (NORVASC) 5 MG tablet Take 1 tablet (5 mg total) by mouth 2 (two) times daily.    atorvastatin (LIPITOR) 10 MG tablet Take 1 tablet (10 mg total) by mouth daily at 6 PM.    BREZTRI AEROSPHERE 160-9-4.8 MCG/ACT AERO Inhale 2 puffs into the lungs 2 (two) times daily. Maintenance per pulmonology    diphenhydrAMINE (BENADRYL) 25 MG tablet Take 50 mg by mouth daily.    esomeprazole (NEXIUM) 40 MG capsule Take 1 capsule (40 mg total) by mouth daily.    glucose blood (ACCU-CHEK GUIDE) test strip USE TO TEST BLOOD SUGAR 2X A DAY    hydrALAZINE (APRESOLINE) 100 MG tablet TAKE 1 TABLET(100 MG) BY MOUTH TWICE DAILY    JARDIANCE 25 MG TABS tablet TAKE 1 TABLET(25 MG) BY MOUTH DAILY BEFORE BREAKFAST    lidocaine (LIDODERM) 5 % Place 1 patch onto the skin daily. Remove & Discard patch within 12 hours or as directed by MD    lidocaine-prilocaine (EMLA) cream APPLY A SMALL AMOUNT TO PORT SITE AT LEAST 1 HOUR PRIOR TO IT BEING ACCESSED, COVER WITH PLASTIC WRAP    metFORMIN (GLUCOPHAGE) 500 MG tablet Take 2 tablets (1,000 mg total) by mouth 2 (two) times daily with a meal. 07/12/2020: Takes 500 mg daily    metoprolol succinate (TOPROL-XL) 100 MG 24 hr tablet Take 1 tablet (100 mg total) by mouth 2 (two) times daily. Take with or immediately following a meal. To be  taken with the 55m metoprolol tartrate    metoprolol tartrate (LOPRESSOR) 50 MG tablet Take 1 tablet (50 mg total) by mouth 2 (two) times daily. To be taken with the 1064mmetorprolol succinate.    montelukast (SINGULAIR) 10 MG tablet Take 10 mg by mouth daily.    morphine (MSIR) 15 MG tablet Take 1 tablet (15 mg total) by mouth every 6 (six) hours as needed for moderate pain or severe pain. Must last 30 days.    morphine (MSIR) 15 MG tablet Take 1 tablet (15 mg total) by  mouth every 6 (six) hours as needed for moderate pain or severe pain. Must last 30 days.    [START ON 10/21/2020] morphine (MSIR) 15 MG tablet Take 1 tablet (15 mg total) by mouth every 6 (six) hours as needed for moderate pain or severe pain. Must last 30 days. 08/21/2020: FUTURE Prescription. (NOT a DUPLICATE!!) >>>DO NOT DELETE<<< (even if Expired!) See [facility]ination [facility] (Dr. NaDossie Arbour   Multiple Vitamin (MULTIVITAMIN WITH MINERALS) TABS tablet Take 1 tablet by mouth daily.    NONFORMULARY OR COMPOUNDED ITEM Apply 1-2 mLs topically 4 (four) times daily as needed. 10% Ketamine/2% Cyclobenzaprine/6% Gabapentin Cream 08/21/2020: FUTURE Prescription. (NOT a DUPLICATE!!) >>>DO NOT DELETE<<< (even if Expired!) [facility] (Dr. NaDossie Arbour   nortriptyline (PAMELOR) 25 MG capsule Take 2 capsules (50 mg total) by mouth at bedtime.    Potassium Chloride ER 20 MEQ TBCR Take 1 tablet by mouth daily.    STIOLTO RESPIMAT 2.5-2.5 MCG/ACT AERS Inhale 2 puffs into the lungs 4 (four) times daily as needed. Prn per pulmonology notes    SUMAtriptan (IMITREX) 50 MG tablet Take 1 tablet (50 mg total) by mouth every 2 (two) hours as needed for migraine. Take 1 tab at onset of migraine. May repeat in 2 hours if headache persists or recurs.    [DISCONTINUED] metoprolol succinate (TOPROL-XL) 100 MG 24 hr tablet SMARTSIG:2 Tablet(s) By Mouth Morning-Night    [DISCONTINUED] metoprolol tartrate (LOPRESSOR) 50 MG tablet Take 1 tablet (50 mg total) by mouth 2 (two) times daily.    Facility-Administered Encounter Medications as of 09/22/2020  Medication   0.9 %  sodium chloride infusion   sodium chloride flush (NS) 0.9 % injection 10 mL    Patient Active Problem List   Diagnosis Date Noted   Chronic use of opiate for therapeutic purpose 08/20/2020   Malignant ne[place] of lung ([place]   Goals of care, counseling/discussion 11/01/2019   Pulmonary emphysema  ([facility]/23[place]   OSA (obstructive sleep apnea) 06/16/2019   History of colonic polyps 02/02/2019   Shortness of breath 02/02/2019   Chronic upper extremity pain (Third area of Pain) (Bilateral) (L>R) 01/19/2019   Squamous cell carcinoma of left lung ([facility] [place]/14/[place]   Lung mass 01/14/2019   Left lower lobe pulmonary nodule 10/26/2018   History of Allergy to amide type local anesthetic (Lidocaine) 10/21/2018    Class: History of   Hyperlipidemia associated with type 2 diabetes mellitus ([facility]/18/2020   Type 2 diabetes mellitus with stage 1 chronic kidney disease, without long-term current use of insulin (HCH[place]10/29/2019   S/P hip replacement 12/02/2017   Hip arthritis 10/01/2017   Aortic atherosclerosis ([facility] [place]/24/2019   Left-sided weakness 08/13/2017   Musculoskeletal pain, chronic 04/03/2017   History of tobacco abuse 06/24/2016   GERD (gastroesophageal reflux disease) 06/24/2016   Tobacco abuse 06/24/2016   Pharmacologic therapy  Benign neoplasm of ascending colon    Polyp of sigmoid colon    Rectal polyp    Benign hypertensive renal disease 01/22/2016   Migraine 01/22/2016   Chronic pain syndrome 01/11/2016   Chronic sacroiliac joint pain (Bilateral) (L>R) 05/31/2015   Chronic hip pain (Left) 05/31/2015   Lumbar facet syndrome (Location of Primary Source of Pain) (Bilateral) (L>R) 05/31/2015   Chronic lower extremity pain (Secondary area of Pain) (Left) 05/31/2015   Greater occipital neuralgia (Right) 05/31/2015   Retrolisthesis of L5-S1 02/06/2015   Cervical disc herniation (C4-5 and C5-6) 02/06/2015   Lumbar disc herniation (L5-S1) 02/06/2015   Hypokalemia 02/01/2015   Cervical spinal stenosis (C4-5) 01/06/2015   Cervical foraminal stenosis (Bilateral C5-6) 01/06/2015   Chronic low back pain (Primary Area of Pain) (Bilateral) (L>R) 01/05/2015   Lumbar spondylosis 01/05/2015   Chronic lumbar radicular pain (Secondary area of Pain) (S1 dermatomal) (Left) 01/05/2015    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) 01/05/2015   Chronic neck pain (posterior midline) (Bilateral) (L>R) 01/05/2015   Cervical spondylosis 01/05/2015   Chronic cervical radicular pain (Third area of Pain) (Bilateral) (C5/C6 dermatome) (L>R) 01/05/2015   Long term current use of opiate analgesic 01/05/2015   Long term prescription opiate use 01/05/2015   Opiate use (60 MME/Day) 37/90/2409   Uncomplicated opioid dependence ([place]) 01/05/2015    Conditions to be addressed/monitored:HTN and Lung cancer   Care Plan : RNCM: Cancer Treatment Phase (Adult)- Lung cancer  Updates made by Vanita Ingles since 09/22/2020 12:00 AM     Problem: TNCM: Cancer treatment plan   Priority: High     Long-Range Goal: RNCM: Cancer treatment- actie phase of treatment   Start Date: 04/19/2020  Expected End Date: 08/08/2021  This Visit's Progress: On track  Priority: High  Note:   Current Barriers:  Knowledge Deficits related to treatment plan and how long treatments will last for current phase of lung cancer treatment Chronic Disease Management support and education needs related to effective management of lung cancer  Lacks caregiver support.  Unable to independently manage lung cancer to the left lung Lacks social connections Does not contact provider office for questions/concerns  Nurse Case Manager Clinical Goal(s):  patient will verbalize understanding of plan for effective management of lung cancer and continuing treatment regimen  patient will work with RNCM, pcp, and specialist  to address needs related to lung cancer and ongoing treatment  patient will demonstrate a decrease in side effects exacerbations as evidenced by maintaining weight, tolerating diet, taking medications to help with potential side effects and working with the CCM team to optimize health and well being patient will attend all scheduled medical appointments: 08-29-2020 at 0820 am the patient will  demonstrate ongoing self health care management ability as evidenced by completion of chemotherapy and improved condition   Interventions:  1:1 collaboration with [name], DO regarding development and update of comprehensive plan of care as evidenced by provider attestation and co-signature Inter-disciplinary care team collaboration (see longitudinal plan of care) Evaluation of current treatment plan related to lung cancer  and patient's adherence to plan as established by provider. 07-21-2020: The patient saw the oncologist on 07-19-2020 and got a good report. The cancer is not completely gone but has seen a big improvement. The patient states that he has 23 more treatments and they will reevaluate. He says he wants as many as he needs because he is going to "beat" this. The patient is very optimistic and  is following the plan of care. He states he is sleeping well and eating well. He denies any acute side effects from his cancer treatments. He says he is doing very well according to his oncologist and happy with the results thus far. 09-22-2020: The patient states he feels great and is still taking his treatments without difficulty. The patient says he will know on 09-27-2020 what the plan for treatment will be and how many additional chemo treatments he will have to take next week. He says they want to make sure that they have eradicated all of the cancer cells. He states he is eating good and maintaining his weight. Is a little tired at times but he was working on his racecar at the time of the outreach call. Will continue to monitor for changes and new needs.  Advised patient to call the office for changes or questions, the patient is seeing specialist regularly  Provided education to patient re: maintaining weight, monitoring for side effects from chemotherapy, safety, adequate rest and working with the CCM team to optimize health and well being  Reviewed scheduled/upcoming provider appointments  including: 03-06-2021 at 0840 am Discussed plans with patient for ongoing care management follow up and provided patient with direct contact information for care management team  Patient Goals/Self-Care Activities Over the next 120 days, patient will:  - Patient will self administer medications as prescribed Patient will attend all scheduled provider appointments Patient will call pharmacy for medication refills Patient will attend church or other social activities Patient will continue to perform ADL's independently Patient will continue to perform IADL's independently Patient will call provider office for new concerns or questions Patient will work with BSW to address care coordination needs and will continue to work with the clinical team to address health care and disease management related needs.   - alternating periods of activity with rest promoted - coagulation studies reviewed and trended - diet adjustment recommended - fall and injury prevention strategies promoted - gentle oral care promoted - individualized medical nutrition therapy provided - signs/symptoms of bleeding reviewed  Follow Up Plan: Telephone follow up appointment with care management team member scheduled for: 11-24-2020 at 345  pm        Task: RNCM: Implement Strategies to Prevent or Monitor Anemia or Bleeding Completed 09/22/2020  Outcome: Positive  Note:   Care Management Activities:    - alternating periods of activity with rest promoted - coagulation studies reviewed and trended - diet adjustment recommended - fall and injury prevention strategies promoted - gentle oral care promoted - individualized medical nutrition therapy provided - signs/symptoms of bleeding reviewed        Care Plan : RNCM: Hypertension (Adult)  Updates made by Vanita Ingles since 09/22/2020 12:00 AM     Problem: RNCM: Hypertension (Hypertension)   Priority: Medium     Long-Range Goal: RNCM: Hypertension Monitored    Start Date: 04/19/2020  Expected End Date: 07/19/2021  This Visit's Progress: On track  Priority: High  Note:   Objective:  Last practice recorded BP readings:  BP Readings from Last 3 Encounters:  09/13/20 124/83  09/01/20 130/83  08/30/20 112/75   Most recent eGFR/CrCl:  Lab Results  Component Value Date   EGFR 99 09/01/2020    No components found for: CRCL Current Barriers:  Knowledge Deficits related to basic understanding of hypertension pathophysiology and self care management Knowledge Deficits related to understanding of medications prescribed for management of hypertension Unable to independently HTN Needing  clarification on medications. 09-22-2020- addressed today and clarification made. Collaboration with the pcp, RNCM, patient and patients wife Case Manager Clinical Goal(s):  patient will verbalize understanding of plan for hypertension management patient will attend all scheduled medical appointments: 03-06-2021 at 0840 am patient will demonstrate improved adherence to prescribed treatment plan for hypertension as evidenced by taking all medications as prescribed, monitoring and recording blood pressure as directed, adhering to low sodium/DASH diet patient will demonstrate improved health management independence as evidenced by checking blood pressure as directed and notifying PCP if SBP>160 or DBP > 90, taking all medications as prescribe, and adhering to a low sodium diet as discussed. patient will verbalize basic understanding of hypertension disease process and self health management plan as evidenced by compliance with medications, compliance with heart healthy/ADA diet, and working with the CCM team to optimize health and well being Interventions:  Collaboration with [name], DO regarding development and update of comprehensive plan of care as evidenced by provider attestation and co-signature Inter-disciplinary care team collaboration (see longitudinal plan of  care) Evaluation of current treatment plan related to hypertension self management and patient's adherence to plan as established by provider.  09-22-2020: On outreach today the patient states that he needed refills on his Metoprolol and his wife had called the office and that she had not received a call back. The patient ask the RNCM to call his wife. Call to his wife revealed that he had been taking Metoprolol succinate 100 mg BID and Metoprolol tartrate 50 mg BID. Message sent to Dr. Wynetta Emery who called RNCM, collaboration done and scripts for both medications sent to the pharmacy for refill. The patients wife was called back by the [facility] and informed of refill information and to check blood pressures regularly. The wife states that the patients blood pressures are check daily and they have been within normal limits. Education on sx and sx of hypotension and what to look for. The patients wife verbalized understanding. Will continue to monitor.  Provided education to patient re: stroke prevention, s/s of heart attack and stroke, DASH diet, complications of uncontrolled blood pressure Reviewed medications with patient and discussed importance of compliance. 09-22-2020: Extensive review of medications and how the patient has been taking. Confirmation made by the patient and patients wife that he takes both Metoprolol tartrate and Metoprolol succinate. PCP has sent new scripts in for medications. Will continue to monitor for changes and educational needs.  Discussed plans with patient for ongoing care management follow up and provided patient with direct contact information for care management team Advised patient, providing education and rationale, to monitor blood pressure daily and record, calling PCP for findings outside established parameters.  Reviewed scheduled/upcoming provider appointments including: 03-06-2021 at 0840 am Self-Care Activities: - Self administers medications as prescribed Attends all  scheduled provider appointments Calls provider office for new concerns, questions, or BP outside discussed parameters Checks BP and records as discussed Follows a low sodium diet/DASH diet Patient Goals: - check blood pressure daily - write blood pressure results in a log or diary - agree on reward when goals are met - agree to work together to make changes - ask questions to understand - have a family meeting to talk about healthy habits - learn about high blood pressure  Follow Up Plan: Telephone follow up appointment with care management team member scheduled for: 11-24-2020 at 345 pm    Task: RNCM: Identify and Monitor Blood Pressure Elevation Completed 09/22/2020  Outcome: Positive  Note:  Care Management Activities:    - blood pressure trends reviewed - depression screen reviewed - home or ambulatory blood pressure monitoring encouraged         Plan:Telephone follow up appointment with care management team member scheduled for:  11-24-2020 at 82 pm  [place], MSN, [facility] Mobile: [phone]

## 2020-09-22 NOTE — Patient Instructions (Signed)
Visit Information  PATIENT GOALS:  Goals Addressed             This Visit's Progress    RNCM: -Track and Manage My Blood Pressure-Hypertension       Timeframe:  Long-Range Goal Priority:  High Start Date:     09-22-2020                        Expected End Date:      09-22-2021                 Follow Up Date: 11-24-2020 at 345 pm   - check blood pressure daily - write blood pressure results in a log or diary    Why is this important?   You won't feel high blood pressure, but it can still hurt your blood vessels.  High blood pressure can cause heart or kidney problems. It can also cause a stroke.  Making lifestyle changes like losing a little weight or eating less salt will help.  Checking your blood pressure at home and at different times of the day can help to control blood pressure.  If the doctor prescribes medicine remember to take it the way the doctor ordered.  Call the office if you cannot afford the medicine or if there are questions about it.     Notes: 09-22-2020: On outreach today the patient states that he needed refills on his Metoprolol and his wife had called the office and that she had not received a call back. The patient ask the RNCM to call his wife. Call to his wife revealed that he had been taking Metoprolol succinate 100 mg BID and Metoprolol tartrate 50 mg BID. Message sent to Dr. Wynetta Emery who called RNCM, collaboration done and scripts for both medications sent to the pharmacy for refill. The patients wife called back and informed of refill information and to check blood pressures regularly. The wife states that the patients blood pressures are check daily and they have been within normal limits. Education on sx and sx of hypotension and what to look for. The patients wife verbalized understanding. Will continue to monitor.      RNCM: Lifestyle Change-Hypertension       Timeframe:  Short-Term Goal Priority:  Medium Start Date:  09-22-2020                            Expected End Date: 02-20-2021                      Follow Up Date 11/24/2020    - agree on reward when goals are met - agree to work together to make changes - ask questions to understand - have a family meeting to talk about healthy habits - learn about high blood pressure    Why is this important?   The changes that you are asked to make may be hard to do.  This is especially true when the changes are life-long.  Knowing why it is important to you is the first step.  Working on the change with your family or support person helps you not feel alone.  Reward yourself and family or support person when goals are met. This can be an activity you choose like bowling, hiking, biking, swimming or shooting hoops.     Notes: 09-22-2020: Review of blood pressure goals and sx and sx of hypotension  Patient verbalizes understanding of instructions provided today and agrees to view in [place].   Telephone follow up appointment with care management team member scheduled for: 11-24-2020 at 71 pm  Noreene Larsson RN, MSN, [facility] Mobile: [phone]

## 2020-09-27 ENCOUNTER — Telehealth: Payer: Self-pay | Admitting: *Deleted

## 2020-09-27 ENCOUNTER — Inpatient Hospital Stay: Payer: Medicare Other

## 2020-09-27 VITALS — BP 121/84 | HR 68 | Temp 97.2°F | Resp 18

## 2020-09-27 DIAGNOSIS — E119 Type 2 diabetes mellitus without complications: Secondary | ICD-10-CM | POA: Diagnosis not present

## 2020-09-27 DIAGNOSIS — Z79899 Other long term (current) drug therapy: Secondary | ICD-10-CM | POA: Diagnosis not present

## 2020-09-27 DIAGNOSIS — G8929 Other chronic pain: Secondary | ICD-10-CM | POA: Diagnosis not present

## 2020-09-27 DIAGNOSIS — E876 Hypokalemia: Secondary | ICD-10-CM | POA: Diagnosis not present

## 2020-09-27 DIAGNOSIS — C3432 Malignant neoplasm of lower lobe, left bronchus or lung: Secondary | ICD-10-CM | POA: Diagnosis not present

## 2020-09-27 DIAGNOSIS — Z809 Family history of malignant neoplasm, unspecified: Secondary | ICD-10-CM | POA: Diagnosis not present

## 2020-09-27 DIAGNOSIS — Z5112 Encounter for antineoplastic immunotherapy: Secondary | ICD-10-CM | POA: Diagnosis not present

## 2020-09-27 DIAGNOSIS — Z8249 Family history of ischemic heart disease and other diseases of the circulatory system: Secondary | ICD-10-CM | POA: Diagnosis not present

## 2020-09-27 DIAGNOSIS — C3492 Malignant neoplasm of unspecified part of left bronchus or lung: Secondary | ICD-10-CM

## 2020-09-27 DIAGNOSIS — F17210 Nicotine dependence, cigarettes, uncomplicated: Secondary | ICD-10-CM | POA: Diagnosis not present

## 2020-09-27 DIAGNOSIS — Z833 Family history of diabetes mellitus: Secondary | ICD-10-CM | POA: Diagnosis not present

## 2020-09-27 MED ORDER — SODIUM CHLORIDE 0.9 % IV SOLN
10.0000 mg/kg | Freq: Once | INTRAVENOUS | Status: AC
  Administered 2020-09-27: 1000 mg via INTRAVENOUS
  Filled 2020-09-27 (×3): qty 20

## 2020-09-27 MED ORDER — SODIUM CHLORIDE 0.9 % IV SOLN
Freq: Once | INTRAVENOUS | Status: AC
  Filled 2020-09-27: qty 250

## 2020-09-27 MED ORDER — HEPARIN SOD (PORK) LOCK FLUSH 100 UNIT/ML IV SOLN
INTRAVENOUS | Status: AC
  Filled 2020-09-27: qty 5

## 2020-09-27 MED ORDER — HEPARIN SOD (PORK) LOCK FLUSH 100 UNIT/ML IV SOLN
500.0000 [IU] | Freq: Once | INTRAVENOUS | Status: AC | PRN
  Administered 2020-09-27: 500 [IU]
  Filled 2020-09-27: qty 5

## 2020-09-27 MED ORDER — SODIUM CHLORIDE 0.9% FLUSH
10.0000 mL | INTRAVENOUS | Status: DC | PRN
  Administered 2020-09-27: 10 mL
  Filled 2020-09-27: qty 10

## 2020-09-27 NOTE — Telephone Encounter (Signed)
   Telephone encounter was:  Successful.  09/27/2020 Name: Kristopher [name] MRN: [id number] DOB: 02/11/1961  [name] is a 60 y.o. year old male who is a primary care patient of [name], DO . The community resource team was consulted for assistance with patient is frustrated by retitive phone calls from bill collectors , going to apply to charity fund for food card to see if he can pay a little bit on his medical bills and use the card for food  Care guide performed the following interventions: Patient provided with information about care guide support team and interviewed to confirm resource needs.  Follow Up Plan:  No further follow up planned at this time. The patient has been provided with needed resources.  [place], Care Management  [phone] [address] , [place] [address] Email : [email or web address]

## 2020-09-27 NOTE — Patient Instructions (Signed)
[facility]  Discharge Instructions: Thank you for choosing [facility] to provide your oncology and hematology care.  If you have a lab appointment with the [facility], please go directly to the [facility] and check in at the registration area.  Wear comfortable clothing and clothing appropriate for easy access to any Portacath or PICC line.   We strive to give you quality time with your provider. You may need to reschedule your appointment if you arrive late (15 or more minutes).  Arriving late affects you and other patients whose appointments are after yours.  Also, if you miss three or more appointments without notifying the office, you may be dismissed from the clinic at the provider's discretion.      For prescription refill requests, have your pharmacy contact our office and allow 72 hours for refills to be completed.    Today you received the following chemotherapy and/or immunotherapy agents IMFINZI      To help prevent nausea and vomiting after your treatment, we encourage you to take your nausea medication as directed.  BELOW ARE SYMPTOMS THAT SHOULD BE REPORTED IMMEDIATELY: *FEVER GREATER THAN 100.4 F (38 C) OR HIGHER *CHILLS OR SWEATING *NAUSEA AND VOMITING THAT IS NOT CONTROLLED WITH YOUR NAUSEA MEDICATION *UNUSUAL SHORTNESS OF BREATH *UNUSUAL BRUISING OR BLEEDING *URINARY PROBLEMS (pain or burning when urinating, or frequent urination) *BOWEL PROBLEMS (unusual diarrhea, constipation, pain near the anus) TENDERNESS IN MOUTH AND THROAT WITH OR WITHOUT PRESENCE OF ULCERS (sore throat, sores in mouth, or a toothache) UNUSUAL RASH, SWELLING OR PAIN  UNUSUAL VAGINAL DISCHARGE OR ITCHING   Items with * indicate a potential emergency and should be followed up as soon as possible or go to the Emergency Department if any problems should occur.  Please show the CHEMOTHERAPY ALERT CARD or IMMUNOTHERAPY ALERT CARD at check-in to  the Emergency Department and triage nurse.  Should you have questions after your visit or need to cancel or reschedule your appointment, please contact [facility]  [phone] and follow the prompts.  Office hours are 8:00 a.m. to 4:30 p.m. Monday - Friday. Please note that voicemails left after 4:00 p.m. may not be returned until the following business day.  We are closed weekends and major holidays. You have access to a nurse at all times for urgent questions. Please call the main number to the clinic [phone] and follow the prompts.  For any non-urgent questions, you may also contact your provider using MyChart. We now offer e-Visits for anyone 91 and older to request care online for non-urgent symptoms. For details visit [email or web address].   Also download the MyChart app! Go to the app store, search "MyChart", open the app, select [facility], and log in with your MyChart username and password.  Due to Covid, a mask is required upon entering the hospital/clinic. If you do not have a mask, one will be given to you upon arrival. For doctor visits, patients may have 1 support person aged 7 or older with them. For treatment visits, patients cannot have anyone with them due to current Covid guidelines and our immunocompromised population.   Durvalumab injection What is this medication? DURVALUMAB (dur VAL ue mab) is a monoclonal antibody. It is used to treat lungcancer. This medicine may be used for other purposes; ask your health care provider orpharmacist if you have questions. COMMON BRAND NAME(S): IMFINZI What should I tell my care team before I take this medication?  They need to know if you have any of these conditions: autoimmune diseases like Crohn's disease, ulcerative colitis, or lupus have had or planning to have an allogeneic stem cell transplant (uses someone else's stem cells) history of organ transplant history of radiation to the  chest nervous system problems like myasthenia gravis or Guillain-Barre syndrome an unusual or allergic reaction to durvalumab, other medicines, foods, dyes, or preservatives pregnant or trying to get pregnant breast-feeding How should I use this medication? This medicine is for infusion into a vein. It is given by a health careprofessional in a hospital or clinic setting. A special MedGuide will be given to you before each treatment. Be sure to readthis information carefully each time. Talk to your pediatrician regarding the use of this medicine in children.Special care may be needed. Overdosage: If you think you have taken too much of this medicine contact apoison control center or emergency room at once. NOTE: This medicine is only for you. Do not share this medicine with others. What if I miss a dose? It is important not to miss your dose. Call your doctor or health careprofessional if you are unable to keep an appointment. What may interact with this medication? Interactions have not been studied. This list may not describe all possible interactions. Give your health care provider a list of all the medicines, herbs, non-prescription drugs, or dietary supplements you use. Also tell them if you smoke, drink alcohol, or use illegaldrugs. Some items may interact with your medicine. What should I watch for while using this medication? This drug may make you feel generally unwell. Continue your course of treatmenteven though you feel ill unless your doctor tells you to stop. You may need blood work done while you are taking this medicine. Do not become pregnant while taking this medicine or for 3 months after stopping it. Women should inform their doctor if they wish to become pregnant or think they might be pregnant. There is a potential for serious side effects to an unborn child. Talk to your health care professional or pharmacist for more information. Do not breast-feed an infant while taking  this medicine orfor 3 months after stopping it. What side effects may I notice from receiving this medication? Side effects that you should report to your doctor or health care professionalas soon as possible: allergic reactions like skin rash, itching or hives, swelling of the face, lips, or tongue black, tarry stools bloody or watery diarrhea breathing problems change in emotions or moods change in sex drive changes in vision chest pain or chest tightness chills confusion cough facial flushing fever headache signs and symptoms of high blood sugar such as dizziness; dry mouth; dry skin; fruity breath; nausea; stomach pain; increased hunger or thirst; increased urination signs and symptoms of liver injury like dark yellow or brown urine; general ill feeling or flu-like symptoms; light-colored stools; loss of appetite; nausea; right upper belly pain; unusually weak or tired; yellowing of the eyes or skin stomach pain trouble passing urine or change in the amount of urine weight gain or weight loss Side effects that usually do not require medical attention (report these toyour doctor or health care professional if they continue or are bothersome): bone pain constipation loss of appetite muscle pain nausea swelling of the ankles, feet, hands tiredness This list may not describe all possible side effects. Call your doctor for medical advice about side effects. You may report side effects to FDA at1-800-FDA-1088. Where should I keep my medication? This drug  is given in a hospital or clinic and will not be stored at home. NOTE: This sheet is a summary. It may not cover all possible information. If you have questions about this medicine, talk to your doctor, pharmacist, orhealth care provider.  2022 Elsevier/Gold Standard (2019-04-29 13:01:29)

## 2020-10-03 ENCOUNTER — Telehealth: Payer: Self-pay | Admitting: *Deleted

## 2020-10-03 NOTE — Telephone Encounter (Signed)
Pt. Was made aware that [employer] gift card was here. He stated he will pick up soon.

## 2020-10-06 NOTE — Progress Notes (Signed)
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  INTERVAL HISTORY: Patient returns to clinic today for further evaluation and consideration of cycle 16 of maintenance durvalumab.  He currently feels well and is asymptomatic.  He continues to tolerate his treatments well without significant side effects.  He has a good appetite and denies weight loss.  He has no neurologic complaints.  He denies any recent fevers or illnesses.  He has no chest pain, shortness of breath, cough, or hemoptysis.  He denies any nausea, vomiting, constipation, or diarrhea.  He has no urinary complaints.  Patient feels at his baseline offers no specific complaints today.  REVIEW OF SYSTEMS:   Review of Systems  Constitutional: Negative.  Negative for fever, malaise/fatigue and weight loss.  Respiratory: Negative.  Negative for cough and shortness of breath.   Cardiovascular: Negative.  Negative for chest pain and leg swelling.  Gastrointestinal: Negative.  Negative for abdominal pain and heartburn.  Genitourinary: Negative.  Negative for dysuria and flank pain.  Musculoskeletal: Negative.  Negative for back pain.  Skin: Negative.  Negative for rash.  Neurological: Negative.  Negative for dizziness, focal weakness, weakness and headaches.  Psychiatric/Behavioral: Negative.  The patient is not nervous/anxious.    As per HPI. Otherwise, a complete review of systems is  negative.  PAST MEDICAL HISTORY: Past Medical History:  Diagnosis Date   Allergy    Arthritis    left foot   Benign hypertensive kidney disease    Chronic back pain    Four rods in back   Diabetes mellitus, type 2 (HCC)    Dyspnea    GERD (gastroesophageal reflux disease)    Hypertension    Malignant neoplasm of lung (HCC)    Migraines    daily    PAST SURGICAL HISTORY: Past Surgical History:  Procedure Laterality Date   APPENDECTOMY     BACK SURGERY     COLONOSCOPY WITH PROPOFOL N/A 02/19/2016   Procedure: COLONOSCOPY WITH PROPOFOL;  Surgeon: Lucilla Lame, MD;  Location: [facility];  Service: Endoscopy;  Laterality: N/A;   COLONOSCOPY WITH PROPOFOL N/A 01/19/2018   Procedure: COLONOSCOPY WITH PROPOFOL;  Surgeon: Lucilla Lame, MD;  Location: [facility];  Service: Endoscopy;  Laterality: N/A;  Diabetic - oral meds   DG OPERATIVE LEFT HIP ([facility] HX)     10/19   ELECTROMAGNETIC NAVIGATION BROCHOSCOPY Left 11/18/2018   Procedure: ELECTROMAGNETIC NAVIGATION BRONCHOSCOPY;  Surgeon: Tyler Pita, MD;  Location: [facility];  Service: Cardiopulmonary;  Laterality: Left;   FLEXIBLE BRONCHOSCOPY Bilateral 01/20/2019   Procedure: FLEXIBLE BRONCHOSCOPY;  Surgeon: Ottie Glazier, MD;  Location: [facility];  Service: Thoracic;  Laterality: Bilateral;   FLEXIBLE BRONCHOSCOPY Bilateral 01/22/2019   Procedure: FLEXIBLE BRONCHOSCOPY;  Surgeon: Ottie Glazier, MD;  Location: [facility];  Service: Thoracic;  Laterality: Bilateral;   FOOT SURGERY Left    Screws and plates   JOINT REPLACEMENT Left 12/2017   DR Rudene Christians Hip   KNEE SURGERY Left    X 2   LEG  SURGERY     LUNG CANCER SURGERY     POLYPECTOMY N/A 02/19/2016   Procedure: POLYPECTOMY;  Surgeon: Lucilla Lame, MD;  Location: [facility];  Service: Endoscopy;  Laterality: N/A;   POLYPECTOMY  01/19/2018   Procedure: POLYPECTOMY;  Surgeon: Lucilla Lame, MD;  Location: [facility];  Service: Endoscopy;;    PORTACATH PLACEMENT N/A 11/11/2019   Procedure: INSERTION PORT-A-CATH;  Surgeon: Nestor Lewandowsky, MD;  Location: [facility];  Service: General;  Laterality: N/A;   THORACOTOMY Left 01/14/2019   Procedure: THORACOTOMY MAJOR, LEFT;  Surgeon: Nestor Lewandowsky, MD;  Location: [facility];  Service: General;  Laterality: Left;   TOTAL HIP ARTHROPLASTY Left 12/02/2017   Procedure: TOTAL HIP ARTHROPLASTY ANTERIOR APPROACH;  Surgeon: Hessie Knows, MD;  Location: [facility];  Service: Orthopedics;  Laterality: Left;   VIDEO BRONCHOSCOPY Left 01/14/2019   Procedure: VIDEO BRONCHOSCOPY WITH FLUORO, LEFT;  Surgeon: Nestor Lewandowsky, MD;  Location: [facility];  Service: General;  Laterality: Left;   VIDEO BRONCHOSCOPY WITH ENDOBRONCHIAL NAVIGATION N/A 10/15/2019   Procedure: VIDEO BRONCHOSCOPY WITH ENDOBRONCHIAL NAVIGATION;  Surgeon: Ottie Glazier, MD;  Location: [facility];  Service: Thoracic;  Laterality: N/A;   VIDEO BRONCHOSCOPY WITH ENDOBRONCHIAL ULTRASOUND N/A 10/15/2019   Procedure: VIDEO BRONCHOSCOPY WITH ENDOBRONCHIAL ULTRASOUND;  Surgeon: Ottie Glazier, MD;  Location: [facility];  Service: Thoracic;  Laterality: N/A;    FAMILY HISTORY: Family History  Problem Relation Age of Onset   Cancer Father    Diabetes Sister    Thrombosis Sister     ADVANCED DIRECTIVES (Y/N):  N  HEALTH MAINTENANCE: Social History   Tobacco Use   Smoking status: Every Day    Packs/day: 0.25    Years: 35.00    Pack years: 8.75    Types: Cigarettes    Last attempt to quit: 01/14/2019    Years since quitting: 1.7   Smokeless tobacco: Never  Vaping Use   Vaping Use: Never used  Substance Use Topics   Alcohol use: No    Alcohol/week: 0.0 standard drinks   Drug use: Yes    Types: Morphine    Comment: prescribed     Colonoscopy:  PAP:  Bone density:  Lipid panel:  Allergies  Allergen Reactions   Acetaminophen Swelling   Aspirin Anaphylaxis   Epinephrine Anaphylaxis    Does not include albuterol   Novocain [Procaine]  Anaphylaxis   Penicillins Anaphylaxis    Has patient had a PCN reaction causing immediate rash, facial/tongue/throat swelling, SOB or lightheadedness with hypotension: Yes Has patient had a PCN reaction causing severe rash involving mucus membranes or skin necrosis: No Has patient had a PCN reaction that required hospitalization: Yes Has patient had a PCN reaction occurring within the last 10 years: No If all of the above answers are "NO", then may proceed with Cephalosporin use.    Strawberry Extract Anaphylaxis   Shellfish Allergy Hives and Nausea And Vomiting    Current Outpatient Medications  Medication Sig Dispense Refill   Accu-Chek FastClix Lancets MISC USE TO TEST BLOOD SUGAR 2X A DAY 100 each 12   albuterol (VENTOLIN HFA) 108 (90 Base) MCG/ACT inhaler Inhale into the lungs.     amLODipine (NORVASC) 5 MG tablet Take 1 tablet (5 mg total) by mouth 2 (two) times daily. 180 tablet 1   atorvastatin (LIPITOR) 10 MG tablet Take 1 tablet (10 mg total) by mouth daily at 6 PM. 90 tablet 1   BREZTRI AEROSPHERE 160-9-4.8 MCG/ACT AERO Inhale 2 puffs into the  lungs 2 (two) times daily. Maintenance per pulmonology     diphenhydrAMINE (BENADRYL) 25 MG tablet Take 50 mg by mouth daily.     esomeprazole (NEXIUM) 40 MG capsule Take 1 capsule (40 mg total) by mouth daily. 90 capsule 3   glucose blood (ACCU-CHEK GUIDE) test strip USE TO TEST BLOOD SUGAR 2X A DAY 100 strip 3   hydrALAZINE (APRESOLINE) 100 MG tablet TAKE 1 TABLET(100 MG) BY MOUTH TWICE DAILY 180 tablet 1   JARDIANCE 25 MG TABS tablet TAKE 1 TABLET(25 MG) BY MOUTH DAILY BEFORE BREAKFAST 90 tablet 0   lidocaine (LIDODERM) 5 % Place 1 patch onto the skin daily. Remove & Discard patch within 12 hours or as directed by MD 30 patch 12   lidocaine-prilocaine (EMLA) cream APPLY A SMALL AMOUNT TO PORT SITE AT LEAST 1 HOUR PRIOR TO IT BEING ACCESSED, COVER WITH PLASTIC WRAP 30 g 1   metFORMIN (GLUCOPHAGE) 500 MG tablet Take 2 tablets (1,000 mg  total) by mouth 2 (two) times daily with a meal. 360 tablet 1   metoprolol succinate (TOPROL-XL) 100 MG 24 hr tablet Take 1 tablet (100 mg total) by mouth 2 (two) times daily. Take with or immediately following a meal. To be taken with the 77m metoprolol tartrate 180 tablet 1   metoprolol tartrate (LOPRESSOR) 50 MG tablet Take 1 tablet (50 mg total) by mouth 2 (two) times daily. To be taken with the 1098mmetorprolol succinate. 180 tablet 1   montelukast (SINGULAIR) 10 MG tablet Take 10 mg by mouth daily.     morphine (MSIR) 15 MG tablet Take 1 tablet (15 mg total) by mouth every 6 (six) hours as needed for moderate pain or severe pain. Must last 30 days. 120 tablet 0   morphine (MSIR) 15 MG tablet Take 1 tablet (15 mg total) by mouth every 6 (six) hours as needed for moderate pain or severe pain. Must last 30 days. 120 tablet 0   [START ON 10/21/2020] morphine (MSIR) 15 MG tablet Take 1 tablet (15 mg total) by mouth every 6 (six) hours as needed for moderate pain or severe pain. Must last 30 days. 120 tablet 0   Multiple Vitamin (MULTIVITAMIN WITH MINERALS) TABS tablet Take 1 tablet by mouth daily.     NONFORMULARY OR COMPOUNDED ITEM Apply 1-2 mLs topically 4 (four) times daily as needed. 10% Ketamine/2% Cyclobenzaprine/6% Gabapentin Cream 240 each PRN   nortriptyline (PAMELOR) 25 MG capsule Take 2 capsules (50 mg total) by mouth at bedtime. 180 capsule 1   Potassium Chloride ER 20 MEQ TBCR Take 1 tablet by mouth daily. 90 tablet 1   STIOLTO RESPIMAT 2.5-2.5 MCG/ACT AERS Inhale 2 puffs into the lungs 4 (four) times daily as needed. Prn per pulmonology notes     SUMAtriptan (IMITREX) 50 MG tablet Take 1 tablet (50 mg total) by mouth every 2 (two) hours as needed for migraine. Take 1 tab at onset of migraine. May repeat in 2 hours if headache persists or recurs. 10 tablet 12   No current facility-administered medications for this visit.   Facility-Administered Medications Ordered in Other Visits   Medication Dose Route Frequency Provider Last Rate Last Admin   0.9 %  sodium chloride infusion    Continuous PRN PeDionne BucyCRNA   New Bag at 01/22/19 0705   sodium chloride flush (NS) 0.9 % injection 10 mL  10 mL Intracatheter PRN FiLloyd HugerMD   10 mL at 03/01/20 [id number]  OBJECTIVE: Vitals:   10/11/20 0834  BP: 102/78  Pulse: 73  Resp: 18  Temp: 97.8 F (36.6 C)     Body mass index is 32.99 kg/m.    ECOG FS:0 - Asymptomatic  General: Well-developed, well-nourished, no acute distress. Eyes: Pink conjunctiva, anicteric sclera. HEENT: Normocephalic, moist mucous membranes. Lungs: No audible wheezing or coughing. Heart: Regular rate and rhythm. Abdomen: Soft, nontender, no obvious distention. Musculoskeletal: No edema, cyanosis, or clubbing. Neuro: Alert, answering all questions appropriately. Cranial nerves grossly intact. Skin: No rashes or petechiae noted. Psych: Normal affect.   LAB RESULTS:  Lab Results  Component Value Date   NA 137 10/11/2020   K 3.5 10/11/2020   CL 102 10/11/2020   CO2 27 10/11/2020   GLUCOSE 153 (H) 10/11/2020   BUN 11 10/11/2020   CREATININE 0.85 10/11/2020   CALCIUM 9.2 10/11/2020   PROT 7.8 10/11/2020   ALBUMIN 4.4 10/11/2020   AST 51 (H) 10/11/2020   ALT 56 (H) 10/11/2020   ALKPHOS 116 10/11/2020   BILITOT 0.4 10/11/2020   GFRNONAA >60 10/11/2020   GFRAA >60 12/01/2019    Lab Results  Component Value Date   WBC 5.8 10/11/2020   NEUTROABS 3.7 10/11/2020   HGB 13.9 10/11/2020   HCT 42.1 10/11/2020   MCV 91.3 10/11/2020   PLT 148 (L) 10/11/2020     STUDIES: No results found.  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  PLAN:    1.  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  He had a complicated postoperative course. CT scan results from August 14, 2019 reviewed independently with a  suspicious subcarinal lymph node measuring 1.4 cm.  Follow-up PET scan on August 23, 2019 revealed hypermetabolism in lymph node highly suspicious for recurrence.  Biopsy confirmed recurrence increasing patient's stage from IA up to IIIA. Patient completed concurrent XRT and weekly carboplatinum and Taxol on January 05, 2020.  Patient initiated 1 year of maintenance immunotherapy using durvalumab on January 19, 2020.  Repeat CT scan from June 19, 2020 reviewed independently with resolution of mediastinal lymphadenopathy and no new or progressive findings.  Proceed with cycle 16 of treatment today.  Return to clinic in 2 weeks for treatment only and then in 4 weeks for further evaluation and consideration of cycle 18 of maintenance durvalumab.   2.  Pain: Chronic and unchanged.  Patient does not complain of this today.  Unrelated to his malignancy.  Continue monitoring and treatment per pain clinic. 3.  Nausea: Patient does not complain of this today.  Previously, patient asked if it were okay to take CBD Gummies for nausea.  From an oncology standpoint this would be okay, but patient did state concern that it would show a positive urine test.  He was instructed to let pain clinic know that he was taking this for oncologic purposes. 4.  Hypokalemia: Resolved.  I spent a total of 30 minutes reviewing chart data, face-to-face evaluation with the patient, counseling and coordination of care as detailed above.   Patient expressed understanding and was in agreement with this plan. He also understands that He can call clinic at any time with any questions, concerns, or complaints.   Cancer Staging Squamous cell carcinoma of left lung ([place]) Staging form: Lung, AJCC 8th Edition - Clinical stage from 02/12/2019: Stage IIIA (cT1b, cN2, cM0) - Signed by [name], MD  on 11/01/2019   [name], MD   10/12/2020 6:20 AM

## 2020-10-11 ENCOUNTER — Inpatient Hospital Stay: Payer: Medicare Other

## 2020-10-11 ENCOUNTER — Inpatient Hospital Stay (HOSPITAL_BASED_OUTPATIENT_CLINIC_OR_DEPARTMENT_OTHER): Payer: Medicare Other | Admitting: Oncology

## 2020-10-11 ENCOUNTER — Encounter: Payer: Self-pay | Admitting: Oncology

## 2020-10-11 ENCOUNTER — Inpatient Hospital Stay: Payer: Medicare Other | Attending: Oncology

## 2020-10-11 VITALS — BP 102/78 | HR 73 | Temp 97.8°F | Resp 18 | Wt 223.4 lb

## 2020-10-11 DIAGNOSIS — Z809 Family history of malignant neoplasm, unspecified: Secondary | ICD-10-CM | POA: Insufficient documentation

## 2020-10-11 DIAGNOSIS — C3492 Malignant neoplasm of unspecified part of left bronchus or lung: Secondary | ICD-10-CM

## 2020-10-11 DIAGNOSIS — F17210 Nicotine dependence, cigarettes, uncomplicated: Secondary | ICD-10-CM | POA: Insufficient documentation

## 2020-10-11 DIAGNOSIS — R110 Nausea: Secondary | ICD-10-CM | POA: Insufficient documentation

## 2020-10-11 DIAGNOSIS — C3432 Malignant neoplasm of lower lobe, left bronchus or lung: Secondary | ICD-10-CM | POA: Insufficient documentation

## 2020-10-11 DIAGNOSIS — Z886 Allergy status to analgesic agent status: Secondary | ICD-10-CM | POA: Diagnosis not present

## 2020-10-11 DIAGNOSIS — G8929 Other chronic pain: Secondary | ICD-10-CM | POA: Diagnosis not present

## 2020-10-11 DIAGNOSIS — Z79899 Other long term (current) drug therapy: Secondary | ICD-10-CM | POA: Diagnosis not present

## 2020-10-11 DIAGNOSIS — Z9049 Acquired absence of other specified parts of digestive tract: Secondary | ICD-10-CM | POA: Diagnosis not present

## 2020-10-11 DIAGNOSIS — Z880 Allergy status to penicillin: Secondary | ICD-10-CM | POA: Insufficient documentation

## 2020-10-11 DIAGNOSIS — Z8249 Family history of ischemic heart disease and other diseases of the circulatory system: Secondary | ICD-10-CM | POA: Insufficient documentation

## 2020-10-11 DIAGNOSIS — Z833 Family history of diabetes mellitus: Secondary | ICD-10-CM | POA: Diagnosis not present

## 2020-10-11 DIAGNOSIS — Z5112 Encounter for antineoplastic immunotherapy: Secondary | ICD-10-CM | POA: Diagnosis not present

## 2020-10-11 LAB — CBC WITH DIFFERENTIAL/PLATELET
Abs Immature Granulocytes: 0.05 10*3/uL (ref 0.00–0.07)
Basophils Absolute: 0.1 10*3/uL (ref 0.0–0.1)
Basophils Relative: 1 %
Eosinophils Absolute: 0.1 10*3/uL (ref 0.0–0.5)
Eosinophils Relative: 2 %
HCT: 42.1 % (ref 39.0–52.0)
Hemoglobin: 13.9 g/dL (ref 13.0–17.0)
Immature Granulocytes: 1 %
Lymphocytes Relative: 24 %
Lymphs Abs: 1.4 10*3/uL (ref 0.7–4.0)
MCH: 30.2 pg (ref 26.0–34.0)
MCHC: 33 g/dL (ref 30.0–36.0)
MCV: 91.3 fL (ref 80.0–100.0)
Monocytes Absolute: 0.5 10*3/uL (ref 0.1–1.0)
Monocytes Relative: 9 %
Neutro Abs: 3.7 10*3/uL (ref 1.7–7.7)
Neutrophils Relative %: 63 %
Platelets: 148 10*3/uL — ABNORMAL LOW (ref 150–400)
RBC: 4.61 MIL/uL (ref 4.22–5.81)
RDW: 13.4 % (ref 11.5–15.5)
WBC: 5.8 10*3/uL (ref 4.0–10.5)
nRBC: 0 % (ref 0.0–0.2)

## 2020-10-11 LAB — COMPREHENSIVE METABOLIC PANEL
ALT: 56 U/L — ABNORMAL HIGH (ref 0–44)
AST: 51 U/L — ABNORMAL HIGH (ref 15–41)
Albumin: 4.4 g/dL (ref 3.5–5.0)
Alkaline Phosphatase: 116 U/L (ref 38–126)
Anion gap: 8 (ref 5–15)
BUN: 11 mg/dL (ref 6–20)
CO2: 27 mmol/L (ref 22–32)
Calcium: 9.2 mg/dL (ref 8.9–10.3)
Chloride: 102 mmol/L (ref 98–111)
Creatinine, Ser: 0.85 mg/dL (ref 0.61–1.24)
GFR, Estimated: >60 mL/min (ref >60)
Glucose, Bld: 153 mg/dL — ABNORMAL HIGH (ref 70–99)
Potassium: 3.5 mmol/L (ref 3.5–5.1)
Sodium: 137 mmol/L (ref 135–145)
Total Bilirubin: 0.4 mg/dL (ref 0.3–1.2)
Total Protein: 7.8 g/dL (ref 6.5–8.1)

## 2020-10-11 LAB — TSH: TSH: 0.961 u[IU]/mL (ref 0.350–4.500)

## 2020-10-11 LAB — T4, FREE: Free T4: 0.79 ng/dL (ref 0.61–1.12)

## 2020-10-11 MED ORDER — HEPARIN SOD (PORK) LOCK FLUSH 100 UNIT/ML IV SOLN
INTRAVENOUS | Status: AC
  Filled 2020-10-11: qty 5

## 2020-10-11 MED ORDER — HEPARIN SOD (PORK) LOCK FLUSH 100 UNIT/ML IV SOLN
500.0000 [IU] | Freq: Once | INTRAVENOUS | Status: AC | PRN
  Administered 2020-10-11: 500 [IU]
  Filled 2020-10-11: qty 5

## 2020-10-11 MED ORDER — SODIUM CHLORIDE 0.9 % IV SOLN
10.0000 mg/kg | Freq: Once | INTRAVENOUS | Status: AC
  Administered 2020-10-11: 1000 mg via INTRAVENOUS
  Filled 2020-10-11: qty 20

## 2020-10-11 MED ORDER — SODIUM CHLORIDE 0.9 % IV SOLN
Freq: Once | INTRAVENOUS | Status: AC
  Filled 2020-10-11: qty 250

## 2020-10-11 NOTE — Progress Notes (Signed)
Patient has noticed itching around his port.

## 2020-10-11 NOTE — Patient Instructions (Signed)
[facility] ONCOLOGY  Discharge Instructions: Thank you for choosing [facility] to provide your oncology and hematology care.  If you have a lab appointment with the [facility], please go directly to the [facility] and check in at the registration area.  Wear comfortable clothing and clothing appropriate for easy access to any Portacath or PICC line.   We strive to give you quality time with your provider. You may need to reschedule your appointment if you arrive late (15 or more minutes).  Arriving late affects you and other patients whose appointments are after yours.  Also, if you miss three or more appointments without notifying the office, you may be dismissed from the clinic at the provider's discretion.      For prescription refill requests, have your pharmacy contact our office and allow 72 hours for refills to be completed.    Today you received the following chemotherapy and/or immunotherapy agents Imfinzi      To help prevent nausea and vomiting after your treatment, we encourage you to take your nausea medication as directed.  BELOW ARE SYMPTOMS THAT SHOULD BE REPORTED IMMEDIATELY: *FEVER GREATER THAN 100.4 F (38 C) OR HIGHER *CHILLS OR SWEATING *NAUSEA AND VOMITING THAT IS NOT CONTROLLED WITH YOUR NAUSEA MEDICATION *UNUSUAL SHORTNESS OF BREATH *UNUSUAL BRUISING OR BLEEDING *URINARY PROBLEMS (pain or burning when urinating, or frequent urination) *BOWEL PROBLEMS (unusual diarrhea, constipation, pain near the anus) TENDERNESS IN MOUTH AND THROAT WITH OR WITHOUT PRESENCE OF ULCERS (sore throat, sores in mouth, or a toothache) UNUSUAL RASH, SWELLING OR PAIN  UNUSUAL VAGINAL DISCHARGE OR ITCHING   Items with * indicate a potential emergency and should be followed up as soon as possible or go to the Emergency Department if any problems should occur.  Please show the CHEMOTHERAPY ALERT CARD or IMMUNOTHERAPY ALERT CARD at check-in to  the Emergency Department and triage nurse.  Should you have questions after your visit or need to cancel or reschedule your appointment, please contact [facility]  [phone] and follow the prompts.  Office hours are 8:00 a.m. to 4:30 p.m. Monday - Friday. Please note that voicemails left after 4:00 p.m. may not be returned until the following business day.  We are closed weekends and major holidays. You have access to a nurse at all times for urgent questions. Please call the main number to the clinic [phone] and follow the prompts.  For any non-urgent questions, you may also contact your provider using MyChart. We now offer e-Visits for anyone 21 and older to request care online for non-urgent symptoms. For details visit [email or web address].   Also download the MyChart app! Go to the app store, search "MyChart", open the app, select [facility], and log in with your MyChart username and password.  Due to Covid, a mask is required upon entering the hospital/clinic. If you do not have a mask, one will be given to you upon arrival. For doctor visits, patients may have 1 support person aged 63 or older with them. For treatment visits, patients cannot have anyone with them due to current Covid guidelines and our immunocompromised population.

## 2020-10-12 ENCOUNTER — Encounter: Payer: Self-pay | Admitting: Oncology

## 2020-10-25 ENCOUNTER — Inpatient Hospital Stay: Payer: Medicare Other

## 2020-10-25 VITALS — BP 128/89 | HR 69 | Temp 97.6°F | Resp 20 | Wt 224.4 lb

## 2020-10-25 DIAGNOSIS — F17210 Nicotine dependence, cigarettes, uncomplicated: Secondary | ICD-10-CM | POA: Diagnosis not present

## 2020-10-25 DIAGNOSIS — Z833 Family history of diabetes mellitus: Secondary | ICD-10-CM | POA: Diagnosis not present

## 2020-10-25 DIAGNOSIS — Z8249 Family history of ischemic heart disease and other diseases of the circulatory system: Secondary | ICD-10-CM | POA: Diagnosis not present

## 2020-10-25 DIAGNOSIS — C3432 Malignant neoplasm of lower lobe, left bronchus or lung: Secondary | ICD-10-CM | POA: Diagnosis not present

## 2020-10-25 DIAGNOSIS — Z809 Family history of malignant neoplasm, unspecified: Secondary | ICD-10-CM | POA: Diagnosis not present

## 2020-10-25 DIAGNOSIS — G8929 Other chronic pain: Secondary | ICD-10-CM | POA: Diagnosis not present

## 2020-10-25 DIAGNOSIS — Z9049 Acquired absence of other specified parts of digestive tract: Secondary | ICD-10-CM | POA: Diagnosis not present

## 2020-10-25 DIAGNOSIS — Z5112 Encounter for antineoplastic immunotherapy: Secondary | ICD-10-CM | POA: Diagnosis not present

## 2020-10-25 DIAGNOSIS — Z880 Allergy status to penicillin: Secondary | ICD-10-CM | POA: Diagnosis not present

## 2020-10-25 DIAGNOSIS — Z886 Allergy status to analgesic agent status: Secondary | ICD-10-CM | POA: Diagnosis not present

## 2020-10-25 DIAGNOSIS — C3492 Malignant neoplasm of unspecified part of left bronchus or lung: Secondary | ICD-10-CM

## 2020-10-25 DIAGNOSIS — Z79899 Other long term (current) drug therapy: Secondary | ICD-10-CM | POA: Diagnosis not present

## 2020-10-25 DIAGNOSIS — R110 Nausea: Secondary | ICD-10-CM | POA: Diagnosis not present

## 2020-10-25 MED ORDER — HEPARIN SOD (PORK) LOCK FLUSH 100 UNIT/ML IV SOLN
500.0000 [IU] | Freq: Once | INTRAVENOUS | Status: AC | PRN
  Administered 2020-10-25: 500 [IU]
  Filled 2020-10-25: qty 5

## 2020-10-25 MED ORDER — SODIUM CHLORIDE 0.9 % IV SOLN
Freq: Once | INTRAVENOUS | Status: AC
  Filled 2020-10-25: qty 250

## 2020-10-25 MED ORDER — SODIUM CHLORIDE 0.9 % IV SOLN
10.0000 mg/kg | Freq: Once | INTRAVENOUS | Status: AC
  Administered 2020-10-25: 1000 mg via INTRAVENOUS
  Filled 2020-10-25: qty 20

## 2020-10-25 MED ORDER — SODIUM CHLORIDE 0.9% FLUSH
10.0000 mL | INTRAVENOUS | Status: DC | PRN
  Administered 2020-10-25: 10 mL
  Filled 2020-10-25: qty 10

## 2020-10-25 MED ORDER — HEPARIN SOD (PORK) LOCK FLUSH 100 UNIT/ML IV SOLN
INTRAVENOUS | Status: AC
  Filled 2020-10-25: qty 5

## 2020-10-25 NOTE — Patient Instructions (Signed)
[facility] [place] ONCOLOGY  Discharge Instructions: Thank you for choosing [facility] to provide your oncology and hematology care.  If you have a lab appointment with the [facility], please go directly to the [facility] and check in at the registration area.  Wear comfortable clothing and clothing appropriate for easy access to any Portacath or PICC line.   We strive to give you quality time with your provider. You may need to reschedule your appointment if you arrive late (15 or more minutes).  Arriving late affects you and other patients whose appointments are after yours.  Also, if you miss three or more appointments without notifying the office, you may be dismissed from the clinic at the provider's discretion.      For prescription refill requests, have your pharmacy contact our office and allow 72 hours for refills to be completed.    Today you received the following chemotherapy and/or immunotherapy agents: Durvalumab      To help prevent nausea and vomiting after your treatment, we encourage you to take your nausea medication as directed.  BELOW ARE SYMPTOMS THAT SHOULD BE REPORTED IMMEDIATELY: *FEVER GREATER THAN 100.4 F (38 C) OR HIGHER *CHILLS OR SWEATING *NAUSEA AND VOMITING THAT IS NOT CONTROLLED WITH YOUR NAUSEA MEDICATION *UNUSUAL SHORTNESS OF BREATH *UNUSUAL BRUISING OR BLEEDING *URINARY PROBLEMS (pain or burning when urinating, or frequent urination) *BOWEL PROBLEMS (unusual diarrhea, constipation, pain near the anus) TENDERNESS IN MOUTH AND THROAT WITH OR WITHOUT PRESENCE OF ULCERS (sore throat, sores in mouth, or a toothache) UNUSUAL RASH, SWELLING OR PAIN  UNUSUAL VAGINAL DISCHARGE OR ITCHING   Items with * indicate a potential emergency and should be followed up as soon as possible or go to the Emergency Department if any problems should occur.  Please show the CHEMOTHERAPY ALERT CARD or IMMUNOTHERAPY ALERT CARD at check-in  to the Emergency Department and triage nurse.  Should you have questions after your visit or need to cancel or reschedule your appointment, please contact [facility]  [phone] and follow the prompts.  Office hours are 8:00 a.m. to 4:30 p.m. Monday - Friday. Please note that voicemails left after 4:00 p.m. may not be returned until the following business day.  We are closed weekends and major holidays. You have access to a nurse at all times for urgent questions. Please call the main number to the clinic [phone] and follow the prompts.  For any non-urgent questions, you may also contact your provider using MyChart. We now offer e-Visits for anyone 45 and older to request care online for non-urgent symptoms. For details visit [email or web address].   Also download the MyChart app! Go to the app store, search "MyChart", open the app, select [facility], and log in with your MyChart username and password.  Due to Covid, a mask is required upon entering the hospital/clinic. If you do not have a mask, one will be given to you upon arrival. For doctor visits, patients may have 1 support person aged 36 or older with them. For treatment visits, patients cannot have anyone with them due to current Covid guidelines and our immunocompromised population. Durvalumab injection What is this medication? DURVALUMAB (dur VAL ue mab) is a monoclonal antibody. It is used to treat lungcancer. This medicine may be used for other purposes; ask your health care provider orpharmacist if you have questions. COMMON BRAND NAME(S): IMFINZI What should I tell my care team before I take this medication? They need  to know if you have any of these conditions: autoimmune diseases like Crohn's disease, ulcerative colitis, or lupus have had or planning to have an allogeneic stem cell transplant (uses someone else's stem cells) history of organ transplant history of radiation to the  chest nervous system problems like myasthenia gravis or Guillain-Barre syndrome an unusual or allergic reaction to durvalumab, other medicines, foods, dyes, or preservatives pregnant or trying to get pregnant breast-feeding How should I use this medication? This medicine is for infusion into a vein. It is given by a health careprofessional in a hospital or clinic setting. A special MedGuide will be given to you before each treatment. Be sure to readthis information carefully each time. Talk to your pediatrician regarding the use of this medicine in children.Special care may be needed. Overdosage: If you think you have taken too much of this medicine contact apoison control center or emergency room at once. NOTE: This medicine is only for you. Do not share this medicine with others. What if I miss a dose? It is important not to miss your dose. Call your doctor or health careprofessional if you are unable to keep an appointment. What may interact with this medication? Interactions have not been studied. This list may not describe all possible interactions. Give your health care provider a list of all the medicines, herbs, non-prescription drugs, or dietary supplements you use. Also tell them if you smoke, drink alcohol, or use illegaldrugs. Some items may interact with your medicine. What should I watch for while using this medication? This drug may make you feel generally unwell. Continue your course of treatmenteven though you feel ill unless your doctor tells you to stop. You may need blood work done while you are taking this medicine. Do not become pregnant while taking this medicine or for 3 months after stopping it. Women should inform their doctor if they wish to become pregnant or think they might be pregnant. There is a potential for serious side effects to an unborn child. Talk to your health care professional or pharmacist for more information. Do not breast-feed an infant while taking  this medicine orfor 3 months after stopping it. What side effects may I notice from receiving this medication? Side effects that you should report to your doctor or health care professionalas soon as possible: allergic reactions like skin rash, itching or hives, swelling of the face, lips, or tongue black, tarry stools bloody or watery diarrhea breathing problems change in emotions or moods change in sex drive changes in vision chest pain or chest tightness chills confusion cough facial flushing fever headache signs and symptoms of high blood sugar such as dizziness; dry mouth; dry skin; fruity breath; nausea; stomach pain; increased hunger or thirst; increased urination signs and symptoms of liver injury like dark yellow or brown urine; general ill feeling or flu-like symptoms; light-colored stools; loss of appetite; nausea; right upper belly pain; unusually weak or tired; yellowing of the eyes or skin stomach pain trouble passing urine or change in the amount of urine weight gain or weight loss Side effects that usually do not require medical attention (report these toyour doctor or health care professional if they continue or are bothersome): bone pain constipation loss of appetite muscle pain nausea swelling of the ankles, feet, hands tiredness This list may not describe all possible side effects. Call your doctor for medical advice about side effects. You may report side effects to FDA at1-800-FDA-1088. Where should I keep my medication? This drug is given  in a hospital or clinic and will not be stored at home. NOTE: This sheet is a summary. It may not cover all possible information. If you have questions about this medicine, talk to your doctor, pharmacist, orhealth care provider.  2022 Elsevier/Gold Standard (2019-04-29 13:01:29)

## 2020-11-03 NOTE — Progress Notes (Signed)
[name]  Telephone:(336) [phone] Fax:(336) [phone]  ID: Kristopher [name] OB: [birth year]  MR#: [id number]  DXI#:[id number]  Patient Care Team: [name], DO as PCP - General (Family Medicine) Gavin Pound, [place] (Inactive) as Certified Medical Assistant Telford Nab, RN as Registered Nurse Vanita Ingles, RN as Case Manager (General Practice) [name], MD as Referring Physician (Pain Medicine) [name], MD as Consulting Physician (Oncology)  CHIEF COMPLAINT: Clinical stage IIIa squamous cell 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  INTERVAL HISTORY: Patient returns to clinic today for further evaluation and consideration of cycle 18 of maintenance durvalumab.  He continues to tolerate his treatments well without significant side effects.  He currently feels well and is asymptomatic.  He has a good appetite and denies weight loss.  He has no neurologic complaints.  He denies any recent fevers or illnesses.  He has no chest pain, shortness of breath, cough, or hemoptysis.  He denies any nausea, vomiting, constipation, or diarrhea.  He has no urinary complaints.  Patient offers no specific complaints today.  REVIEW OF SYSTEMS:   Review of Systems  Constitutional: Negative.  Negative for fever, malaise/fatigue and weight loss.  Respiratory: Negative.  Negative for cough and shortness of breath.   Cardiovascular: Negative.  Negative for chest pain and leg swelling.  Gastrointestinal: Negative.  Negative for abdominal pain and heartburn.  Genitourinary: Negative.  Negative for dysuria and flank pain.  Musculoskeletal: Negative.  Negative for back pain.  Skin: Negative.  Negative for rash.  Neurological: Negative.  Negative for dizziness, focal weakness, weakness and headaches.  Psychiatric/Behavioral: Negative.  The patient is not nervous/anxious.    As per HPI. Otherwise, a complete review of systems is negative.  PAST MEDICAL  HISTORY: Past Medical History:  Diagnosis Date   Allergy    Arthritis    left foot   Benign hypertensive kidney disease    Chronic back pain    Four rods in back   Diabetes mellitus, type 2 (HCC)    Dyspnea    GERD (gastroesophageal reflux disease)    Hypertension    Malignant neoplasm of lung (HCC)    Migraines    daily    PAST SURGICAL HISTORY: Past Surgical History:  Procedure Laterality Date   APPENDECTOMY     BACK SURGERY     COLONOSCOPY WITH PROPOFOL N/A 02/19/2016   Procedure: COLONOSCOPY WITH PROPOFOL;  Surgeon: Lucilla Lame, MD;  Location: [facility];  Service: Endoscopy;  Laterality: N/A;   COLONOSCOPY WITH PROPOFOL N/A 01/19/2018   Procedure: COLONOSCOPY WITH PROPOFOL;  Surgeon: Lucilla Lame, MD;  Location: [facility];  Service: Endoscopy;  Laterality: N/A;  Diabetic - oral meds   DG OPERATIVE LEFT HIP ([facility] HX)     10/19   ELECTROMAGNETIC NAVIGATION BROCHOSCOPY Left 11/18/2018   Procedure: ELECTROMAGNETIC NAVIGATION BRONCHOSCOPY;  Surgeon: Tyler Pita, MD;  Location: [facility];  Service: Cardiopulmonary;  Laterality: Left;   FLEXIBLE BRONCHOSCOPY Bilateral 01/20/2019   Procedure: FLEXIBLE BRONCHOSCOPY;  Surgeon: Ottie Glazier, MD;  Location: [facility];  Service: Thoracic;  Laterality: Bilateral;   FLEXIBLE BRONCHOSCOPY Bilateral 01/22/2019   Procedure: FLEXIBLE BRONCHOSCOPY;  Surgeon: Ottie Glazier, MD;  Location: [facility];  Service: Thoracic;  Laterality: Bilateral;   FOOT SURGERY Left    Screws and plates   JOINT REPLACEMENT Left 12/2017   DR Rudene Christians Hip   KNEE SURGERY Left    X 2   LEG SURGERY  LUNG CANCER SURGERY     POLYPECTOMY N/A 02/19/2016   Procedure: POLYPECTOMY;  Surgeon: Lucilla Lame, MD;  Location: [facility];  Service: Endoscopy;  Laterality: N/A;   POLYPECTOMY  01/19/2018   Procedure: POLYPECTOMY;  Surgeon: Lucilla Lame, MD;  Location: [facility];  Service: Endoscopy;;   PORTACATH PLACEMENT N/A  11/11/2019   Procedure: INSERTION PORT-A-CATH;  Surgeon: Nestor Lewandowsky, MD;  Location: [facility];  Service: General;  Laterality: N/A;   THORACOTOMY Left 01/14/2019   Procedure: THORACOTOMY MAJOR, LEFT;  Surgeon: Nestor Lewandowsky, MD;  Location: [facility];  Service: General;  Laterality: Left;   TOTAL HIP ARTHROPLASTY Left 12/02/2017   Procedure: TOTAL HIP ARTHROPLASTY ANTERIOR APPROACH;  Surgeon: Hessie Knows, MD;  Location: [facility];  Service: Orthopedics;  Laterality: Left;   VIDEO BRONCHOSCOPY Left 01/14/2019   Procedure: VIDEO BRONCHOSCOPY WITH FLUORO, LEFT;  Surgeon: Nestor Lewandowsky, MD;  Location: [facility];  Service: General;  Laterality: Left;   VIDEO BRONCHOSCOPY WITH ENDOBRONCHIAL NAVIGATION N/A 10/15/2019   Procedure: VIDEO BRONCHOSCOPY WITH ENDOBRONCHIAL NAVIGATION;  Surgeon: Ottie Glazier, MD;  Location: [facility];  Service: Thoracic;  Laterality: N/A;   VIDEO BRONCHOSCOPY WITH ENDOBRONCHIAL ULTRASOUND N/A 10/15/2019   Procedure: VIDEO BRONCHOSCOPY WITH ENDOBRONCHIAL ULTRASOUND;  Surgeon: Ottie Glazier, MD;  Location: [facility];  Service: Thoracic;  Laterality: N/A;    FAMILY HISTORY: Family History  Problem Relation Age of Onset   Cancer Father    Diabetes Sister    Thrombosis Sister     ADVANCED DIRECTIVES (Y/N):  N  HEALTH MAINTENANCE: Social History   Tobacco Use   Smoking status: Every Day    Packs/day: 0.25    Years: 35.00    Pack years: 8.75    Types: Cigarettes    Last attempt to quit: 01/14/2019    Years since quitting: 1.8   Smokeless tobacco: Never  Vaping Use   Vaping Use: Never used  Substance Use Topics   Alcohol use: No    Alcohol/week: 0.0 standard drinks   Drug use: Yes    Types: Morphine    Comment: prescribed     Colonoscopy:  PAP:  Bone density:  Lipid panel:  Allergies  Allergen Reactions   Acetaminophen Swelling   Aspirin Anaphylaxis   Epinephrine Anaphylaxis    Does not include albuterol   Novocain [Procaine] Anaphylaxis    Penicillins Anaphylaxis    Has patient had a PCN reaction causing immediate rash, facial/tongue/throat swelling, SOB or lightheadedness with hypotension: Yes Has patient had a PCN reaction causing severe rash involving mucus membranes or skin necrosis: No Has patient had a PCN reaction that required hospitalization: Yes Has patient had a PCN reaction occurring within the last 10 years: No If all of the above answers are "NO", then may proceed with Cephalosporin use.    Strawberry Extract Anaphylaxis   Shellfish Allergy Hives and Nausea And Vomiting    Current Outpatient Medications  Medication Sig Dispense Refill   Accu-Chek FastClix Lancets MISC USE TO TEST BLOOD SUGAR 2X A DAY 100 each 12   albuterol (VENTOLIN HFA) 108 (90 Base) MCG/ACT inhaler Inhale into the lungs.     amLODipine (NORVASC) 5 MG tablet Take 1 tablet (5 mg total) by mouth 2 (two) times daily. 180 tablet 1   atorvastatin (LIPITOR) 10 MG tablet Take 1 tablet (10 mg total) by mouth daily at 6 PM. 90 tablet 1   BREZTRI AEROSPHERE 160-9-4.8 MCG/ACT AERO Inhale 2 puffs into the lungs 2 (two) times daily.  Maintenance per pulmonology     diphenhydrAMINE (BENADRYL) 25 MG tablet Take 50 mg by mouth daily.     esomeprazole (NEXIUM) 40 MG capsule Take 1 capsule (40 mg total) by mouth daily. 90 capsule 3   glucose blood (ACCU-CHEK GUIDE) test strip USE TO TEST BLOOD SUGAR 2X A DAY 100 strip 3   hydrALAZINE (APRESOLINE) 100 MG tablet TAKE 1 TABLET(100 MG) BY MOUTH TWICE DAILY 180 tablet 1   JARDIANCE 25 MG TABS tablet TAKE 1 TABLET(25 MG) BY MOUTH DAILY BEFORE BREAKFAST 90 tablet 0   lidocaine (LIDODERM) 5 % Place 1 patch onto the skin daily. Remove & Discard patch within 12 hours or as directed by MD 30 patch 12   lidocaine-prilocaine (EMLA) cream APPLY A SMALL AMOUNT TO PORT SITE AT LEAST 1 HOUR PRIOR TO IT BEING ACCESSED, COVER WITH PLASTIC WRAP 30 g 1   metFORMIN (GLUCOPHAGE) 500 MG tablet Take 2 tablets (1,000 mg total) by mouth 2  (two) times daily with a meal. 360 tablet 1   metoprolol succinate (TOPROL-XL) 100 MG 24 hr tablet Take 1 tablet (100 mg total) by mouth 2 (two) times daily. Take with or immediately following a meal. To be taken with the 22m metoprolol tartrate 180 tablet 1   metoprolol tartrate (LOPRESSOR) 50 MG tablet Take 1 tablet (50 mg total) by mouth 2 (two) times daily. To be taken with the 1051mmetorprolol succinate. 180 tablet 1   montelukast (SINGULAIR) 10 MG tablet Take 10 mg by mouth daily.     morphine (MSIR) 15 MG tablet Take 1 tablet (15 mg total) by mouth every 6 (six) hours as needed for moderate pain or severe pain. Must last 30 days. 120 tablet 0   Multiple Vitamin (MULTIVITAMIN WITH MINERALS) TABS tablet Take 1 tablet by mouth daily.     NONFORMULARY OR COMPOUNDED ITEM Apply 1-2 mLs topically 4 (four) times daily as needed. 10% Ketamine/2% Cyclobenzaprine/6% Gabapentin Cream 240 each PRN   nortriptyline (PAMELOR) 25 MG capsule Take 2 capsules (50 mg total) by mouth at bedtime. 180 capsule 1   Potassium Chloride ER 20 MEQ TBCR Take 1 tablet by mouth daily. 90 tablet 1   STIOLTO RESPIMAT 2.5-2.5 MCG/ACT AERS Inhale 2 puffs into the lungs 4 (four) times daily as needed. Prn per pulmonology notes     SUMAtriptan (IMITREX) 50 MG tablet Take 1 tablet (50 mg total) by mouth every 2 (two) hours as needed for migraine. Take 1 tab at onset of migraine. May repeat in 2 hours if headache persists or recurs. 10 tablet 12   morphine (MSIR) 15 MG tablet Take 1 tablet (15 mg total) by mouth every 6 (six) hours as needed for moderate pain or severe pain. Must last 30 days. 120 tablet 0   morphine (MSIR) 15 MG tablet Take 1 tablet (15 mg total) by mouth every 6 (six) hours as needed for moderate pain or severe pain. Must last 30 days. 120 tablet 0   No current facility-administered medications for this visit.   Facility-Administered Medications Ordered in Other Visits  Medication Dose Route Frequency Provider  Last Rate Last Admin   0.9 %  sodium chloride infusion    Continuous PRN PeDionne BucyCRNA   New Bag at 01/22/19 0705   sodium chloride flush (NS) 0.9 % injection 10 mL  10 mL Intracatheter PRN FiLloyd HugerMD   10 mL at 03/01/20 0953    OBJECTIVE: Vitals:   11/08/20 0835  BP:  121/79  Pulse: 68  Resp: 16  Temp: 98.1 F (36.7 C)     Body mass index is 33.15 kg/m.    ECOG FS:0 - Asymptomatic  General: Well-developed, well-nourished, no acute distress. Eyes: Pink conjunctiva, anicteric sclera. HEENT: Normocephalic, moist mucous membranes. Lungs: No audible wheezing or coughing. Heart: Regular rate and rhythm. Abdomen: Soft, nontender, no obvious distention. Musculoskeletal: No edema, cyanosis, or clubbing. Neuro: Alert, answering all questions appropriately. Cranial nerves grossly intact. Skin: No rashes or petechiae noted. Psych: Normal affect.   LAB RESULTS:  Lab Results  Component Value Date   NA 135 11/08/2020   K 3.7 11/08/2020   CL 101 11/08/2020   CO2 26 11/08/2020   GLUCOSE 138 (H) 11/08/2020   BUN 10 11/08/2020   CREATININE 0.83 11/08/2020   CALCIUM 9.0 11/08/2020   PROT 8.1 11/08/2020   ALBUMIN 4.6 11/08/2020   AST 48 (H) 11/08/2020   ALT 55 (H) 11/08/2020   ALKPHOS 129 (H) 11/08/2020   BILITOT 0.4 11/08/2020   GFRNONAA >60 11/08/2020   GFRAA >60 12/01/2019    Lab Results  Component Value Date   WBC 6.0 11/08/2020   NEUTROABS 3.7 11/08/2020   HGB 13.9 11/08/2020   HCT 43.2 11/08/2020   MCV 90.9 11/08/2020   PLT 168 11/08/2020     STUDIES: No results found.  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  PLAN:    1.  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  He had a complicated postoperative course. CT scan results from August 14, 2019 reviewed independently with a suspicious subcarinal lymph node measuring  1.4 cm.  Follow-up PET scan on August 23, 2019 revealed hypermetabolism in lymph node highly suspicious for recurrence.  Biopsy confirmed recurrence increasing patient's stage from IA up to IIIA. Patient completed concurrent XRT and weekly carboplatinum and Taxol on January 05, 2020.  Patient initiated 1 year of maintenance immunotherapy using durvalumab on January 19, 2020.  Repeat CT scan from June 19, 2020 reviewed independently with resolution of mediastinal lymphadenopathy and no new or progressive findings.  Proceed with cycle 18 of treatment today.  Return to clinic in 2 weeks for treatment only in 4 weeks for further evaluation and consideration of cycle 20 of maintenance durvalumab.   2.  Pain: Chronic and unchanged.  Patient does not complain of this today.  Unrelated to his malignancy.  Continue monitoring and treatment per pain clinic. 3.  Nausea: Resolved.  Previously, patient asked if it were okay to take CBD Gummies for nausea.  From an oncology standpoint this would be okay, but patient did state concern that it would show a positive urine test.  He was instructed to let pain clinic know that he was taking this for oncologic purposes. 4.  Transaminitis: Mild, monitor.  I spent a total of 30 minutes reviewing chart data, face-to-face evaluation with the patient, counseling and coordination of care as detailed above.    Patient expressed understanding and was in agreement with this plan. He also understands that He can call clinic at any time with any questions, concerns, or complaints.   Cancer Staging Squamous cell carcinoma of left lung [facility]) Staging form: Lung, AJCC 8th Edition - Clinical stage from 02/12/2019: Stage IIIA (cT1b, cN2, cM0) - Signed by [name], MD on 11/01/2019   [name], MD   11/09/2020 7:14 AM

## 2020-11-08 ENCOUNTER — Inpatient Hospital Stay: Payer: Medicare Other

## 2020-11-08 ENCOUNTER — Other Ambulatory Visit: Payer: Self-pay

## 2020-11-08 ENCOUNTER — Inpatient Hospital Stay (HOSPITAL_BASED_OUTPATIENT_CLINIC_OR_DEPARTMENT_OTHER): Payer: Medicare Other | Admitting: Oncology

## 2020-11-08 ENCOUNTER — Encounter: Payer: Self-pay | Admitting: Oncology

## 2020-11-08 ENCOUNTER — Inpatient Hospital Stay: Payer: Medicare Other | Attending: Oncology

## 2020-11-08 VITALS — BP 121/79 | HR 68 | Temp 98.1°F | Resp 16 | Wt 224.5 lb

## 2020-11-08 DIAGNOSIS — C3432 Malignant neoplasm of lower lobe, left bronchus or lung: Secondary | ICD-10-CM | POA: Insufficient documentation

## 2020-11-08 DIAGNOSIS — C3492 Malignant neoplasm of unspecified part of left bronchus or lung: Secondary | ICD-10-CM | POA: Diagnosis not present

## 2020-11-08 DIAGNOSIS — Z833 Family history of diabetes mellitus: Secondary | ICD-10-CM | POA: Insufficient documentation

## 2020-11-08 DIAGNOSIS — Z5112 Encounter for antineoplastic immunotherapy: Secondary | ICD-10-CM | POA: Diagnosis not present

## 2020-11-08 DIAGNOSIS — F17210 Nicotine dependence, cigarettes, uncomplicated: Secondary | ICD-10-CM | POA: Diagnosis not present

## 2020-11-08 DIAGNOSIS — Z809 Family history of malignant neoplasm, unspecified: Secondary | ICD-10-CM | POA: Diagnosis not present

## 2020-11-08 DIAGNOSIS — Z79899 Other long term (current) drug therapy: Secondary | ICD-10-CM | POA: Diagnosis not present

## 2020-11-08 DIAGNOSIS — R7401 Elevation of levels of liver transaminase levels: Secondary | ICD-10-CM | POA: Diagnosis not present

## 2020-11-08 DIAGNOSIS — E119 Type 2 diabetes mellitus without complications: Secondary | ICD-10-CM | POA: Diagnosis not present

## 2020-11-08 DIAGNOSIS — Z8249 Family history of ischemic heart disease and other diseases of the circulatory system: Secondary | ICD-10-CM | POA: Insufficient documentation

## 2020-11-08 LAB — CBC WITH DIFFERENTIAL/PLATELET
Abs Immature Granulocytes: 0.04 10*3/uL (ref 0.00–0.07)
Basophils Absolute: 0.1 10*3/uL (ref 0.0–0.1)
Basophils Relative: 1 %
Eosinophils Absolute: 0.1 10*3/uL (ref 0.0–0.5)
Eosinophils Relative: 2 %
HCT: 43.2 % (ref 39.0–52.0)
Hemoglobin: 13.9 g/dL (ref 13.0–17.0)
Immature Granulocytes: 1 %
Lymphocytes Relative: 27 %
Lymphs Abs: 1.6 10*3/uL (ref 0.7–4.0)
MCH: 29.3 pg (ref 26.0–34.0)
MCHC: 32.2 g/dL (ref 30.0–36.0)
MCV: 90.9 fL (ref 80.0–100.0)
Monocytes Absolute: 0.5 10*3/uL (ref 0.1–1.0)
Monocytes Relative: 8 %
Neutro Abs: 3.7 10*3/uL (ref 1.7–7.7)
Neutrophils Relative %: 61 %
Platelets: 168 10*3/uL (ref 150–400)
RBC: 4.75 MIL/uL (ref 4.22–5.81)
RDW: 13.5 % (ref 11.5–15.5)
WBC: 6 10*3/uL (ref 4.0–10.5)
nRBC: 0 % (ref 0.0–0.2)

## 2020-11-08 LAB — COMPREHENSIVE METABOLIC PANEL
ALT: 55 U/L — ABNORMAL HIGH (ref 0–44)
AST: 48 U/L — ABNORMAL HIGH (ref 15–41)
Albumin: 4.6 g/dL (ref 3.5–5.0)
Alkaline Phosphatase: 129 U/L — ABNORMAL HIGH (ref 38–126)
Anion gap: 8 (ref 5–15)
BUN: 10 mg/dL (ref 6–20)
CO2: 26 mmol/L (ref 22–32)
Calcium: 9 mg/dL (ref 8.9–10.3)
Chloride: 101 mmol/L (ref 98–111)
Creatinine, Ser: 0.83 mg/dL (ref 0.61–1.24)
GFR, Estimated: >60 mL/min (ref >60)
Glucose, Bld: 138 mg/dL — ABNORMAL HIGH (ref 70–99)
Potassium: 3.7 mmol/L (ref 3.5–5.1)
Sodium: 135 mmol/L (ref 135–145)
Total Bilirubin: 0.4 mg/dL (ref 0.3–1.2)
Total Protein: 8.1 g/dL (ref 6.5–8.1)

## 2020-11-08 LAB — TSH: TSH: 1.113 u[IU]/mL (ref 0.350–4.500)

## 2020-11-08 MED ORDER — HEPARIN SOD (PORK) LOCK FLUSH 100 UNIT/ML IV SOLN
INTRAVENOUS | Status: AC
  Filled 2020-11-08: qty 5

## 2020-11-08 MED ORDER — SODIUM CHLORIDE 0.9 % IV SOLN
10.0000 mg/kg | Freq: Once | INTRAVENOUS | Status: AC
  Administered 2020-11-08: 1000 mg via INTRAVENOUS
  Filled 2020-11-08: qty 20

## 2020-11-08 MED ORDER — SODIUM CHLORIDE 0.9 % IV SOLN
Freq: Once | INTRAVENOUS | Status: AC
  Filled 2020-11-08: qty 250

## 2020-11-08 NOTE — Patient Instructions (Signed)
[facility]  Discharge Instructions: Thank you for choosing [facility] to provide your oncology and hematology care.  If you have a lab appointment with the [facility], please go directly to the [facility] and check in at the registration area.  Wear comfortable clothing and clothing appropriate for easy access to any Portacath or PICC line.   We strive to give you quality time with your provider. You may need to reschedule your appointment if you arrive late (15 or more minutes).  Arriving late affects you and other patients whose appointments are after yours.  Also, if you miss three or more appointments without notifying the office, you may be dismissed from the clinic at the provider's discretion.      For prescription refill requests, have your pharmacy contact our office and allow 72 hours for refills to be completed.    Today you received the following chemotherapy and/or immunotherapy agents : Imfinzi   To help prevent nausea and vomiting after your treatment, we encourage you to take your nausea medication as directed.  BELOW ARE SYMPTOMS THAT SHOULD BE REPORTED IMMEDIATELY: *FEVER GREATER THAN 100.4 F (38 C) OR HIGHER *CHILLS OR SWEATING *NAUSEA AND VOMITING THAT IS NOT CONTROLLED WITH YOUR NAUSEA MEDICATION *UNUSUAL SHORTNESS OF BREATH *UNUSUAL BRUISING OR BLEEDING *URINARY PROBLEMS (pain or burning when urinating, or frequent urination) *BOWEL PROBLEMS (unusual diarrhea, constipation, pain near the anus) TENDERNESS IN MOUTH AND THROAT WITH OR WITHOUT PRESENCE OF ULCERS (sore throat, sores in mouth, or a toothache) UNUSUAL RASH, SWELLING OR PAIN  UNUSUAL VAGINAL DISCHARGE OR ITCHING   Items with * indicate a potential emergency and should be followed up as soon as possible or go to the Emergency Department if any problems should occur.  Please show the CHEMOTHERAPY ALERT CARD or IMMUNOTHERAPY ALERT CARD at check-in to  the Emergency Department and triage nurse.  Should you have questions after your visit or need to cancel or reschedule your appointment, please contact [facility]  [phone] and follow the prompts.  Office hours are 8:00 a.m. to 4:30 p.m. Monday - Friday. Please note that voicemails left after 4:00 p.m. may not be returned until the following business day.  We are closed weekends and major holidays. You have access to a nurse at all times for urgent questions. Please call the main number to the clinic [phone] and follow the prompts.  For any non-urgent questions, you may also contact your provider using MyChart. We now offer e-Visits for anyone 11 and older to request care online for non-urgent symptoms. For details visit [email or web address].   Also download the MyChart app! Go to the app store, search "MyChart", open the app, select [facility], and log in with your MyChart username and password.  Due to Covid, a mask is required upon entering the hospital/clinic. If you do not have a mask, one will be given to you upon arrival. For doctor visits, patients may have 1 support person aged 31 or older with them. For treatment visits, patients cannot have anyone with them due to current Covid guidelines and our immunocompromised population.

## 2020-11-08 NOTE — Progress Notes (Signed)
Patient denies new problems/concerns today.   °

## 2020-11-09 ENCOUNTER — Encounter: Payer: Self-pay | Admitting: Oncology

## 2020-11-09 LAB — T4: T4, Total: 7.5 ug/dL (ref 4.5–12.0)

## 2020-11-18 NOTE — Progress Notes (Signed)
PROVIDER NOTE: Information contained herein reflects review and annotations entered in association with encounter. Interpretation of such information and data should be left to medically-trained personnel. Information provided to patient can be located elsewhere in the medical record under "Patient Instructions". Document created using STT-dictation technology, any transcriptional errors that may result from process are unintentional.    Patient: Kristopher [name]  Service Category: E/M  Provider: Gaspar Cola, MD  DOB: November 01, 1960  DOS: 11/20/2020  Specialty: Interventional Pain Management  MRN: [id number]  Setting: Ambulatory outpatient  PCP: [name], DO  Type: Established Patient    Referring Provider: [name], DO  Location: Office  Delivery: Face-to-face     HPI  [name], a 60 y.o. year old male, is here today because of his Chronic pain syndrome [G89.4]. [name] primary complain today is Back Pain (Lower midline ) Last encounter: My last encounter with him was on 08/21/2020. Pertinent problems: [name] has Chronic low back pain (1ry area of Pain) (Bilateral) (L>R); Lumbar spondylosis; Chronic lumbar radicular pain (S1 dermatomal) (Left); 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); Chronic neck pain (posterior midline) (Bilateral) (L>R); Cervical spondylosis; Chronic cervical radicular pain (Bilateral) (C5/C6 dermatome) (L>R); Cervical spinal stenosis (C4-5); Cervical foraminal stenosis (Bilateral C5-6); Retrolisthesis of L5-S1; Cervical disc herniation (C4-5 and C5-6); Lumbar disc herniation (L5-S1); Chronic sacroiliac joint pain (Bilateral) (L>R); Chronic hip pain (Left); Lumbar facet syndrome (Bilateral) (L>R); Chronic lower extremity pain (2ry area of Pain) (Left); Greater occipital neuralgia (Right); Chronic pain syndrome; Migraine; Musculoskeletal pain, chronic; Left-sided weakness; Hip arthritis; S/P hip replacement; Squamous cell  carcinoma of left lung (HCC); and Chronic upper extremity pain (3ry area of Pain) (Bilateral) (L>R) on their pertinent problem list. Pain Assessment: Severity of Chronic pain is reported as a 3 /10. Location: Back Lower, Mid/into hips and legs. Onset: More than a month ago. Quality: Discomfort, Constant, Stabbing. Timing: Constant. Modifying factor(s): pills and compounding crea.. Vitals:  height is _0  (1.778 m) and weight is 224 lb (101.6 kg). His temporal temperature is 97.5 F (36.4 C) (abnormal). His blood pressure is 134/95 (abnormal) and his pulse is 68. His oxygen saturation is 100%.   Reason for encounter: medication management.   The patient indicates doing well with the current medication regimen. No adverse reactions or side effects reported to the medications.   RTCB: 02/18/2021  Pharmacotherapy Assessment  Analgesic: Morphine IR 15 mg, 1 tablet p.o. every 6 hours (60 mg/day of morphine) (60 MME) + Compounded Specialty Analgesic Cream (w/ Ketamine) MME/day: 60 mg/day.   Monitoring: [facility] PMP: PDMP reviewed during this encounter.       Pharmacotherapy: No side-effects or adverse reactions reported. Compliance: No problems identified. Effectiveness: Clinically acceptable.  Janett Billow, RN  11/20/2020  8:18 AM  Sign when Signing Visit Nursing Pain Medication Assessment:  Safety precautions to be maintained throughout the outpatient stay will include: orient to surroundings, keep bed in low position, maintain call bell within reach at all times, provide assistance with transfer out of bed and ambulation.  Medication Inspection Compliance: Pill count conducted under aseptic conditions, in front of the patient. Neither the pills nor the bottle was removed from the patient's sight at any time. Once count was completed pills were immediately returned to the patient in their original bottle.  Medication: Morphine IR Pill/Patch Count:  3 of 120 pills remain Pill/Patch Appearance:  Markings consistent with prescribed medication Bottle Appearance: Standard pharmacy container. Clearly labeled. Filled  Date: 08 / 20 / 2022 Last Medication intake:  Today    UDS:  Summary  Date Value Ref Range Status  05/22/2020 Note  Final    Comment:    ==================================================================== [employer] Select 13 (MW) ==================================================================== Test                             Result       Flag       Units  Drug Present and Declared for Prescription Verification   Morphine                       >9259        EXPECTED   ng/mg creat   Normorphine                    92           EXPECTED   ng/mg creat    Potential sources of large amounts of morphine in the absence of    codeine include administration of morphine or use of heroin.     Normorphine is an expected metabolite of morphine.  ==================================================================== Test                      Result    Flag   Units      Ref Range   Creatinine              108              mg/dL      >=20 ==================================================================== Declared Medications:  The flagging and interpretation on this report are based on the  following declared medications.  Unexpected results may arise from  inaccuracies in the declared medications.   **Note: The testing scope of this panel includes these medications:   Morphine (MSIR)   **Note: The testing scope of this panel does not include the  following reported medications:   Albuterol (Ventolin HFA)  Amlodipine (Norvasc)  Atorvastatin (Lipitor)  Budesonide  Diphenhydramine (Benadryl)  Empagliflozin (Jardiance)  Esomeprazole (Nexium)  Fluticasone  Formoterol  Glycopyrrolate  Hydralazine (Apresoline)  Metformin (Glucophage)  Metoprolol (Lopressor)  Montelukast (Singulair)  Multivitamin  Nortriptyline (Pamelor)  Olodaterol (Stiolto Respimat)  Pantoprazole  (Protonix)  Potassium  Prilocaine (EMLA)  Sumatriptan (Imitrex)  Tiotropium (Stiolto Respimat)  Topical Cyclobenzaprine  Topical Gabapentin  Topical Ketamine  Topical Lidocaine (Lidoderm)  Topical Lidocaine (EMLA) ==================================================================== For clinical consultation, please call [phone]. ====================================================================      ROS  Constitutional: Denies any fever or chills Gastrointestinal: No reported hemesis, hematochezia, vomiting, or acute GI distress Musculoskeletal: Denies any acute onset joint swelling, redness, loss of ROM, or weakness Neurological: No reported episodes of acute onset apraxia, aphasia, dysarthria, agnosia, amnesia, paralysis, loss of coordination, or loss of consciousness  Medication Review  Accu-Chek FastClix Lancets, Budeson-Glycopyrrol-Formoterol, NONFORMULARY OR COMPOUNDED ITEM, Potassium Chloride ER, SUMAtriptan, Tiotropium Bromide-Olodaterol, albuterol, amLODipine, atorvastatin, diphenhydrAMINE, empagliflozin, esomeprazole, glucose blood, hydrALAZINE, lidocaine, lidocaine-prilocaine, metFORMIN, metoprolol succinate, metoprolol tartrate, montelukast, morphine, multivitamin with minerals, and nortriptyline  History Review  Allergy: [name] is allergic to acetaminophen, aspirin, epinephrine, novocain [procaine], penicillins, strawberry extract, and shellfish allergy. Drug: [name]  reports current drug use. Drug: Morphine. Alcohol:  reports no history of alcohol use. Tobacco:  reports that he has been smoking cigarettes. He has a 8.75 pack-year smoking history. He has never used smokeless tobacco. Social: [name]  reports  that he has been smoking cigarettes. He has a 8.75 pack-year smoking history. He has never used smokeless tobacco. He reports current drug use. Drug: Morphine. He reports that he does not drink alcohol. Medical:  has a past medical history of  Allergy, Arthritis, Benign hypertensive kidney disease, Chronic back pain, Diabetes mellitus, type 2 ([place]), Dyspnea, GERD (gastroesophageal reflux disease), Hypertension, Malignant neoplasm of lung ([place]), and Migraines. Surgical: [name]  has a past surgical history that includes Back surgery; Appendectomy; Leg Surgery; Knee surgery (Left); Foot surgery (Left); Colonoscopy with propofol (N/A, 02/19/2016); polypectomy (N/A, 02/19/2016); Total hip arthroplasty (Left, 12/02/2017); Colonoscopy with propofol (N/A, 01/19/2018); polypectomy (01/19/2018); DG OPERATIVE LEFT HIP ([facility] HX); Joint replacement (Left, 12/2017); Video bronchoscopy (Left, 01/14/2019); Thoracotomy (Left, 01/14/2019); Flexible bronchoscopy (Bilateral, 01/20/2019); Flexible bronchoscopy (Bilateral, 01/22/2019); Electormagnetic navigation bronchoscopy (Left, 11/18/2018); Lung cancer surgery; Video bronchoscopy with endobronchial ultrasound (N/A, 10/15/2019); Video bronchoscopy with endobronchial navigation (N/A, 10/15/2019); and Portacath placement (N/A, 11/11/2019). Family: family history includes Cancer in his father; Diabetes in his sister; Thrombosis in his sister.  Laboratory Chemistry Profile   Renal Lab Results  Component Value Date   BUN 10 11/08/2020   CREATININE 0.83 11/08/2020   BCR 11 09/01/2020   GFRAA >60 12/01/2019   GFRNONAA >60 11/08/2020    Hepatic Lab Results  Component Value Date   AST 48 (H) 11/08/2020   ALT 55 (H) 11/08/2020   ALBUMIN 4.6 11/08/2020   ALKPHOS 129 (H) 11/08/2020   LIPASE 50 02/17/2019    Electrolytes Lab Results  Component Value Date   NA 135 11/08/2020   K 3.7 11/08/2020   CL 101 11/08/2020   CALCIUM 9.0 11/08/2020   MG 2.4 01/25/2019   PHOS 3.5 01/23/2019    Bone No results found for: VD25OH, VD125OH2TOT, GD9242AS3, MH9622WL7, 25OHVITD1, 25OHVITD2, 25OHVITD3, TESTOFREE, TESTOSTERONE  Inflammation (CRP: Acute Phase) (ESR: Chronic Phase) Lab Results  Component Value Date   CRP 0.7  04/03/2015   ESRSEDRATE 11 10/21/2017   LATICACIDVEN 1.7 01/16/2019         Note: Above Lab results reviewed.  Recent Imaging Review  CT Chest W Contrast CLINICAL DATA:  Non-small-cell lung cancer.  Restaging.  EXAM: CT CHEST WITH CONTRAST  TECHNIQUE: Multidetector CT imaging of the chest was performed during intravenous contrast administration.  CONTRAST:  62m OMNIPAQUE IOHEXOL 300 MG/ML  SOLN  COMPARISON:  10/28/2019  FINDINGS: Cardiovascular: The heart size is normal. No substantial pericardial effusion. Atherosclerotic calcification is noted in the wall of the thoracic aorta. Left Port-A-Cath tip is positioned in the proximal SVC.  Mediastinum/Nodes: No mediastinal lymphadenopathy. 1.9 cm short axis subcarinal node measured previously is 1.0 cm today (62/2). 1.3 cm short axis AP window node measured previously is 0.6 cm short axis today (48/2) no evidence for gross hilar lymphadenopathy although assessment is limited by the lack of intravenous contrast on today's study. The esophagus has normal imaging features. There is no axillary lymphadenopathy.  Lungs/Pleura: Stable 4 mm posterior right upper lobe pulmonary nodule on 35/3. Surgical changes in the left hilum consistent with prior left lower lobectomy. No new suspicious pulmonary nodule or mass. No focal airspace consolidation. There is no evidence of pleural effusion.  Upper Abdomen: Calcified granulomata noted in the spleen.  Musculoskeletal: No worrisome lytic or sclerotic osseous abnormality.  IMPRESSION: 1. Interval resolution of mediastinal lymphadenopathy seen previously. 2. No new or progressive findings to suggest new recurrent or metastatic disease. 3. Stable 4 mm posterior right upper lobe pulmonary nodule. Continued attention on  follow-up recommended. 4. Aortic Atherosclerosis (ICD10-I70.0).  Electronically Signed   By: Misty Stanley M.D.   On: 06/19/2020 11:14 Note: Reviewed         Physical Exam  General appearance: Well nourished, well developed, and well hydrated. In no apparent acute distress Mental status: Alert, oriented x 3 (person, place, & time)       Respiratory: No evidence of acute respiratory distress Eyes: PERLA Vitals: BP (!) 134/95 (BP Location: Right Arm, Patient Position: Sitting, Cuff Size: Normal)   Pulse 68   Temp (!) 97.5 F (36.4 C) (Temporal)   Ht _0  (1.778 m)   Wt 224 lb (101.6 kg)   SpO2 100%   BMI 32.14 kg/m  BMI: Estimated body mass index is 32.14 kg/m as calculated from the following:   Height as of this encounter: _1  (1.778 m).   Weight as of this encounter: 224 lb (101.6 kg). Ideal: Ideal body weight: 73 kg (160 lb 15 oz) Adjusted ideal body weight: 84.4 kg (186 lb 2.6 oz)  Assessment   Status Diagnosis  Controlled Controlled Controlled 1. Chronic pain syndrome   2. Chronic low back pain (1ry area of Pain) (Bilateral) (L>R)   3. Chronic lower extremity pain (2ry area of Pain) (Left)   4. Chronic upper extremity pain (3ry area of Pain) (Bilateral) (L>R)   5. 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)   6. Squamous cell carcinoma of left lung ([place])   7. Pharmacologic therapy   8. Chronic use of opiate for therapeutic purpose   9. Encounter for medication management      Updated Problems: Problem  Chronic upper extremity pain (3ry area of Pain) (Bilateral) (L>R)  Lumbar facet syndrome (Bilateral) (L>R)  Chronic lower extremity pain (2ry area of Pain) (Left)   Pain runs down the lateral aspect of the leg to the level of the knee.   Chronic low back pain (1ry area of Pain) (Bilateral) (L>R)  Chronic lumbar radicular pain (S1 dermatomal) (Left)  Chronic cervical radicular pain (Bilateral) (C5/C6 dermatome) (L>R)   Left upper extremity pain: Pain goes to the index, middle, and ring fingers. (C7/C8 dermatomal distribution). The primary symptom is pain followed by numbness and weakness, all of  which are constant. Right upper extremity pain: Pain goes to the middle, ring, and pinky fingers. (C7/C8 dermatomal distribution). The primary symptom is pain followed by numbness, but in this case they are intermittent.     Plan of Care  Problem-specific:  No problem-specific Assessment & Plan notes found for this encounter.  [name] has a current medication list which includes the following long-term medication(s): amlodipine, atorvastatin, esomeprazole, hydralazine, metformin, metoprolol succinate, metoprolol tartrate, morphine, [START ON 12/20/2020] morphine, [START ON 01/19/2021] morphine, nortriptyline, potassium chloride er, and sumatriptan.  Pharmacotherapy (Medications Ordered): Meds ordered this encounter  Medications   morphine (MSIR) 15 MG tablet    Sig: Take 1 tablet (15 mg total) by mouth every 6 (six) hours as needed for moderate pain or severe pain. Must last 30 days.    Dispense:  120 tablet    Refill:  0    Not a duplicate. Do NOT delete! Dispense 1 day early if closed on refill date. Avoid benzodiazepines within 8 hours of opioids. Do not send refill requests.   morphine (MSIR) 15 MG tablet    Sig: Take 1 tablet (15 mg total) by mouth every 6 (six) hours as needed for moderate pain or severe pain. Must  last 30 days.    Dispense:  120 tablet    Refill:  0    Not a duplicate. Do NOT delete! Dispense 1 day early if closed on refill date. Avoid benzodiazepines within 8 hours of opioids. Do not send refill requests.   morphine (MSIR) 15 MG tablet    Sig: Take 1 tablet (15 mg total) by mouth every 6 (six) hours as needed for moderate pain or severe pain. Must last 30 days.    Dispense:  120 tablet    Refill:  0    Not a duplicate. Do NOT delete! Dispense 1 day early if closed on refill date. Avoid benzodiazepines within 8 hours of opioids. Do not send refill requests.   NONFORMULARY OR COMPOUNDED ITEM    Sig: Apply 1-2 mLs topically 4 (four) times daily as  needed. 10% Ketamine/2% Cyclobenzaprine/6% Gabapentin Cream    Dispense:  240 each    Refill:  PRN    Please compound: Ketamine (10%) + Cyclobenzaprine (2%) + Gabapentin Cream (6%) into cream    Orders:  No orders of the defined types were placed in this encounter.  Follow-up plan:   Return in about 3 months (around 02/18/2021) for Eval-day (M,W), (F2F), (MM).     Interventional therapies: Planned, scheduled, and/or pending:   None at this time. Claims to have anaphylactic allergy to Local Anesthetics. He was sent for testing and shown/proven not to be allergic to Lidocaine.   Considering:   None at this time. Claims to have anaphylactic allergy to Local Anesthetics. He was sent for testing and shown/proven not to be allergic to Lidocaine.   Palliative PRN treatment(s):   None at this time. Claims to have anaphylactic allergy to Local Anesthetics. He was sent for testing and shown/proven not to be allergic to Lidocaine.    Recent Visits No visits were found meeting these conditions. Showing recent visits within past 90 days and meeting all other requirements Today's Visits Date Type Provider Dept  11/20/20 Office Visit [name], MD [facility]  Showing today's visits and meeting all other requirements Future Appointments No visits were found meeting these conditions. Showing future appointments within next 90 days and meeting all other requirements I discussed the assessment and treatment plan with the patient. The patient was provided an opportunity to ask questions and all were answered. The patient agreed with the plan and demonstrated an understanding of the instructions.  Patient advised to call back or seek an in-person evaluation if the symptoms or condition worsens.  Duration of encounter: 30 minutes.  Note by: Gaspar Cola, MD Date: 11/20/2020; Time: 8:26 AM

## 2020-11-20 ENCOUNTER — Other Ambulatory Visit: Payer: Self-pay

## 2020-11-20 ENCOUNTER — Ambulatory Visit: Payer: Medicare Other | Attending: Pain Medicine | Admitting: Pain Medicine

## 2020-11-20 ENCOUNTER — Encounter: Payer: Self-pay | Admitting: Pain Medicine

## 2020-11-20 VITALS — BP 134/95 | HR 68 | Temp 97.5°F | Ht 70.0 in | Wt 224.0 lb

## 2020-11-20 DIAGNOSIS — C3492 Malignant neoplasm of unspecified part of left bronchus or lung: Secondary | ICD-10-CM | POA: Diagnosis not present

## 2020-11-20 DIAGNOSIS — G8929 Other chronic pain: Secondary | ICD-10-CM | POA: Insufficient documentation

## 2020-11-20 DIAGNOSIS — M79605 Pain in left leg: Secondary | ICD-10-CM | POA: Diagnosis not present

## 2020-11-20 DIAGNOSIS — Z79891 Long term (current) use of opiate analgesic: Secondary | ICD-10-CM | POA: Diagnosis not present

## 2020-11-20 DIAGNOSIS — G894 Chronic pain syndrome: Secondary | ICD-10-CM | POA: Diagnosis not present

## 2020-11-20 DIAGNOSIS — M5441 Lumbago with sciatica, right side: Secondary | ICD-10-CM | POA: Insufficient documentation

## 2020-11-20 DIAGNOSIS — M961 Postlaminectomy syndrome, not elsewhere classified: Secondary | ICD-10-CM | POA: Insufficient documentation

## 2020-11-20 DIAGNOSIS — M79602 Pain in left arm: Secondary | ICD-10-CM | POA: Diagnosis not present

## 2020-11-20 DIAGNOSIS — Z79899 Other long term (current) drug therapy: Secondary | ICD-10-CM | POA: Diagnosis not present

## 2020-11-20 DIAGNOSIS — M79601 Pain in right arm: Secondary | ICD-10-CM | POA: Diagnosis not present

## 2020-11-20 DIAGNOSIS — M5442 Lumbago with sciatica, left side: Secondary | ICD-10-CM | POA: Diagnosis not present

## 2020-11-20 MED ORDER — MORPHINE SULFATE 15 MG PO TABS: 15.0000 mg | ORAL_TABLET | Freq: Four times a day (QID) | ORAL | 0 refills | Status: DC | PRN

## 2020-11-20 MED ORDER — NONFORMULARY OR COMPOUNDED ITEM: 1.0000 mL | Freq: Four times a day (QID) | 99 refills | Status: DC | PRN

## 2020-11-20 NOTE — Progress Notes (Signed)
Nursing Pain Medication Assessment:  Safety precautions to be maintained throughout the outpatient stay will include: orient to surroundings, keep bed in low position, maintain call bell within reach at all times, provide assistance with transfer out of bed and ambulation.  Medication Inspection Compliance: Pill count conducted under aseptic conditions, in front of the patient. Neither the pills nor the bottle was removed from the patient's sight at any time. Once count was completed pills were immediately returned to the patient in their original bottle.  Medication: Morphine IR Pill/Patch Count:  3 of 120 pills remain Pill/Patch Appearance: Markings consistent with prescribed medication Bottle Appearance: Standard pharmacy container. Clearly labeled. Filled Date: 08 / 20 / 2022 Last Medication intake:  Today

## 2020-11-20 NOTE — Patient Instructions (Signed)
____________________________________________________________________________________________  Medication Rules  Purpose: To inform patients, and their family members, of our rules and regulations.  Applies to: All patients receiving prescriptions (written or electronic).  Pharmacy of record: Pharmacy where electronic prescriptions will be sent. If written prescriptions are taken to a different pharmacy, please inform the nursing staff. The pharmacy listed in the electronic medical record should be the one where you would like electronic prescriptions to be sent.  Electronic prescriptions: In compliance with the [place] Strengthen Opioid Misuse Prevention (STOP) Act of 2017 (Session Law 2017-74/H243), effective March 04, 2018, all controlled substances must be electronically prescribed. Calling prescriptions to the pharmacy will cease to exist.  Prescription refills: Only during scheduled appointments. Applies to all prescriptions.  NOTE: The following applies primarily to controlled substances (Opioid* Pain Medications).   Type of encounter (visit): For patients receiving controlled substances, face-to-face visits are required. (Not an option or up to the patient.)  Patient's responsibilities: Pain Pills: Bring all pain pills to every appointment (except for procedure appointments). Pill Bottles: Bring pills in original pharmacy bottle. Always bring the newest bottle. Bring bottle, even if empty. Medication refills: You are responsible for knowing and keeping track of what medications you take and those you need refilled. The day before your appointment: write a list of all prescriptions that need to be refilled. The day of the appointment: give the list to the admitting nurse. Prescriptions will be written only during appointments. No prescriptions will be written on procedure days. If you forget a medication: it will not be "Called in", "Faxed", or "electronically sent". You will  need to get another appointment to get these prescribed. No early refills. Do not call asking to have your prescription filled early. Prescription Accuracy: You are responsible for carefully inspecting your prescriptions before leaving our office. Have the discharge nurse carefully go over each prescription with you, before taking them home. Make sure that your name is accurately spelled, that your address is correct. Check the name and dose of your medication to make sure it is accurate. Check the number of pills, and the written instructions to make sure they are clear and accurate. Make sure that you are given enough medication to last until your next medication refill appointment. Taking Medication: Take medication as prescribed. When it comes to controlled substances, taking less pills or less frequently than prescribed is permitted and encouraged. Never take more pills than instructed. Never take medication more frequently than prescribed.  Inform other Doctors: Always inform, all of your healthcare providers, of all the medications you take. Pain Medication from other Providers: You are not allowed to accept any additional pain medication from any other Doctor or Healthcare provider. There are two exceptions to this rule. (see below) In the event that you require additional pain medication, you are responsible for notifying us, as stated below. Cough Medicine: Often these contain an opioid, such as codeine or hydrocodone. Never accept or take cough medicine containing these opioids if you are already taking an opioid* medication. The combination may cause respiratory failure and death. Medication Agreement: You are responsible for carefully reading and following our Medication Agreement. This must be signed before receiving any prescriptions from our practice. Safely store a copy of your signed Agreement. Violations to the Agreement will result in no further prescriptions. (Additional copies of our  Medication Agreement are available upon request.) Laws, Rules, & Regulations: All patients are expected to follow all Federal and State Laws, Statutes, Rules, & Regulations. Ignorance of   the Laws does not constitute a valid excuse.  Illegal drugs and Controlled Substances: The use of illegal substances (including, but not limited to marijuana and its derivatives) and/or the illegal use of any controlled substances is strictly prohibited. Violation of this rule may result in the immediate and permanent discontinuation of any and all prescriptions being written by our practice. The use of any illegal substances is prohibited. Adopted CDC guidelines & recommendations: Target dosing levels will be at or below 60 MME/day. Use of benzodiazepines** is not recommended.  Exceptions: There are only two exceptions to the rule of not receiving pain medications from other Healthcare Providers. Exception #1 (Emergencies): In the event of an emergency (i.e.: accident requiring emergency care), you are allowed to receive additional pain medication. However, you are responsible for: As soon as you are able, call our office [phone], at any time of the day or night, and leave a message stating your name, the date and nature of the emergency, and the name and dose of the medication prescribed. In the event that your call is answered by a member of our staff, make sure to document and save the date, time, and the name of the person that took your information.  Exception #2 (Planned Surgery): In the event that you are scheduled by another doctor or dentist to have any type of surgery or procedure, you are allowed (for a period no longer than 30 days), to receive additional pain medication, for the acute post-op pain. However, in this case, you are responsible for picking up a copy of our "Post-op Pain Management for Surgeons" handout, and giving it to your surgeon or dentist. This document is available at our office, and  does not require an appointment to obtain it. Simply go to our office during business hours (Monday-Thursday from 8:00 AM to 4:00 PM) (Friday 8:00 AM to 12:00 Noon) or if you have a scheduled appointment with us, prior to your surgery, and ask for it by name. In addition, you are responsible for: calling our office [phone], at any time of the day or night, and leaving a message stating your name, name of your surgeon, type of surgery, and date of procedure or surgery. Failure to comply with your responsibilities may result in termination of therapy involving the controlled substances.  *Opioid medications include: morphine, codeine, oxycodone, oxymorphone, hydrocodone, hydromorphone, meperidine, tramadol, tapentadol, buprenorphine, fentanyl, methadone. **Benzodiazepine medications include: diazepam (Valium), alprazolam (Xanax), clonazepam (Klonopine), lorazepam (Ativan), clorazepate (Tranxene), chlordiazepoxide (Librium), estazolam (Prosom), oxazepam (Serax), temazepam (Restoril), triazolam (Halcion) (Last updated: 01/31/2020) ____________________________________________________________________________________________  ____________________________________________________________________________________________  Medication Recommendations and Reminders  Applies to: All patients receiving prescriptions (written and/or electronic).  Medication Rules & Regulations: These rules and regulations exist for your safety and that of others. They are not flexible and neither are we. Dismissing or ignoring them will be considered "non-compliance" with medication therapy, resulting in complete and irreversible termination of such therapy. (See document titled "Medication Rules" for more details.) In all conscience, because of safety reasons, we cannot continue providing a therapy where the patient does not follow instructions.  Pharmacy of record:  Definition: This is the pharmacy where your electronic  prescriptions will be sent.  We do not endorse any particular pharmacy, however, we have experienced problems with [facility] not securing enough medication supply for the community. We do not restrict you in your choice of pharmacy. However, once we write for your prescriptions, we will NOT be re-sending more prescriptions to fix restricted supply problems   created by your pharmacy, or your insurance.  The pharmacy listed in the electronic medical record should be the one where you want electronic prescriptions to be sent. If you choose to change pharmacy, simply notify our nursing staff.  Recommendations: Keep all of your pain medications in a safe place, under lock and key, even if you live alone. We will NOT replace lost, stolen, or damaged medication. After you fill your prescription, take 1 week's worth of pills and put them away in a safe place. You should keep a separate, properly labeled bottle for this purpose. The remainder should be kept in the original bottle. Use this as your primary supply, until it runs out. Once it's gone, then you know that you have 1 week's worth of medicine, and it is time to come in for a prescription refill. If you do this correctly, it is unlikely that you will ever run out of medicine. To make sure that the above recommendation works, it is very important that you make sure your medication refill appointments are scheduled at least 1 week before you run out of medicine. To do this in an effective manner, make sure that you do not leave the office without scheduling your next medication management appointment. Always ask the nursing staff to show you in your prescription , when your medication will be running out. Then arrange for the receptionist to get you a return appointment, at least 7 days before you run out of medicine. Do not wait until you have 1 or 2 pills left, to come in. This is very poor planning and does not take into consideration that we may need to  cancel appointments due to bad weather, sickness, or emergencies affecting our staff. DO NOT ACCEPT A "Partial Fill": If for any reason your pharmacy does not have enough pills/tablets to completely fill or refill your prescription, do not allow for a "partial fill". The law allows the pharmacy to complete that prescription within 72 hours, without requiring a new prescription. If they do not fill the rest of your prescription within those 72 hours, you will need a separate prescription to fill the remaining amount, which we will NOT provide. If the reason for the partial fill is your insurance, you will need to talk to the pharmacist about payment alternatives for the remaining tablets, but again, DO NOT ACCEPT A PARTIAL FILL, unless you can trust your pharmacist to obtain the remainder of the pills within 72 hours.  Prescription refills and/or changes in medication(s):  Prescription refills, and/or changes in dose or medication, will be conducted only during scheduled medication management appointments. (Applies to both, written and electronic prescriptions.) No refills on procedure days. No medication will be changed or started on procedure days. No changes, adjustments, and/or refills will be conducted on a procedure day. Doing so will interfere with the diagnostic portion of the procedure. No phone refills. No medications will be "called into the pharmacy". No Fax refills. No weekend refills. No Holliday refills. No after hours refills.  Remember:  Business hours are:  Monday to Thursday 8:00 AM to 4:00 PM Provider's Schedule: Carmino Ocain, MD - Appointments are:  Medication management: Monday and Wednesday 8:00 AM to 4:00 PM Procedure day: Tuesday and Thursday 7:30 AM to 4:00 PM Bilal Lateef, MD - Appointments are:  Medication management: Tuesday and Thursday 8:00 AM to 4:00 PM Procedure day: Monday and Wednesday 7:30 AM to 4:00 PM (Last update:  09/22/2019) ____________________________________________________________________________________________  ____________________________________________________________________________________________  CBD (cannabidiol) WARNING    Applicable to: All individuals currently taking or considering taking CBD (cannabidiol) and, more important, all patients taking opioid analgesic controlled substances (pain medication). (Example: oxycodone; oxymorphone; hydrocodone; hydromorphone; morphine; methadone; tramadol; tapentadol; fentanyl; buprenorphine; butorphanol; dextromethorphan; meperidine; codeine; etc.)  Legal status: CBD remains a Schedule I drug prohibited for any use. CBD is illegal with one exception. In the [place], CBD has a limited Food and Drug Administration (FDA) approval for the treatment of two specific types of epilepsy disorders. Only one CBD product has been approved by the FDA for this purpose: "Epidiolex". FDA is aware that some companies are marketing products containing cannabis and cannabis-derived compounds in ways that violate the Federal Food, Drug and Cosmetic Act (FD&C Act) and that may put the health and safety of consumers at risk. The FDA, a Federal agency, has not enforced the CBD status since 2018.   Legality: Some manufacturers ship CBD products nationally, which is illegal. Often such products are sold online and are therefore available throughout the country. CBD is openly sold in head shops and health food stores in some states where such sales have not been explicitly legalized. Selling unapproved products with unsubstantiated therapeutic claims is not only a violation of the law, but also can put patients at risk, as these products have not been proven to be safe or effective. Federal illegality makes it difficult to conduct research on CBD.  Reference: "FDA Regulation of Cannabis and Cannabis-Derived Products, Including Cannabidiol (CBD)" -  https://www.fda.gov/news-events/public-health-focus/fda-regulation-cannabis-and-cannabis-derived-products-including-cannabidiol-cbd  Warning: CBD is not FDA approved and has not undergo the same manufacturing controls as prescription drugs.  This means that the purity and safety of available CBD may be questionable. Most of the time, despite manufacturer's claims, it is contaminated with THC (delta-9-tetrahydrocannabinol - the chemical in marijuana responsible for the "HIGH").  When this is the case, the THC contaminant will trigger a positive urine drug screen (UDS) test for Marijuana (carboxy-THC). Because a positive UDS for any illicit substance is a violation of our medication agreement, your opioid analgesics (pain medicine) may be permanently discontinued.  MORE ABOUT CBD  General Information: CBD  is a derivative of the Marijuana (cannabis sativa) plant discovered in 1940. It is one of the 113 identified substances found in Marijuana. It accounts for up to 40% of the plant's extract. As of 2018, preliminary clinical studies on CBD included research for the treatment of anxiety, movement disorders, and pain. CBD is available and consumed in multiple forms, including inhalation of smoke or vapor, as an aerosol spray, and by mouth. It may be supplied as an oil containing CBD, capsules, dried cannabis, or as a liquid solution. CBD is thought not to be as psychoactive as THC (delta-9-tetrahydrocannabinol - the chemical in marijuana responsible for the "HIGH"). Studies suggest that CBD may interact with different biological target receptors in the body, including cannabinoid and other neurotransmitter receptors. As of 2018 the mechanism of action for its biological effects has not been determined.  Side-effects  Adverse reactions: Dry mouth, diarrhea, decreased appetite, fatigue, drowsiness, malaise, weakness, sleep disturbances, and others.  Drug interactions: CBC may interact with other medications  such as blood-thinners. (Last update: 10/09/2019) ____________________________________________________________________________________________  ____________________________________________________________________________________________  Drug Holidays (Slow)  What is a "Drug Holiday"? Drug Holiday: is the name given to the period of time during which a patient stops taking a medication(s) for the purpose of eliminating tolerance to the drug.  Benefits Improved effectiveness of opioids. Decreased opioid dose needed to achieve benefits. Improved pain with lesser dose.    What is tolerance? Tolerance: is the progressive decreased in effectiveness of a drug due to its repetitive use. With repetitive use, the body gets use to the medication and as a consequence, it loses its effectiveness. This is a common problem seen with opioid pain medications. As a result, a larger dose of the drug is needed to achieve the same effect that used to be obtained with a smaller dose.  How long should a "Drug Holiday" last? You should stay off of the pain medicine for at least 14 consecutive days. (2 weeks)  Should I stop the medicine "cold turkey"? No. You should always coordinate with your Pain Specialist so that he/she can provide you with the correct medication dose to make the transition as smoothly as possible.  How do I stop the medicine? Slowly. You will be instructed to decrease the daily amount of pills that you take by one (1) pill every seven (7) days. This is called a "slow downward taper" of your dose. For example: if you normally take four (4) pills per day, you will be asked to drop this dose to three (3) pills per day for seven (7) days, then to two (2) pills per day for seven (7) days, then to one (1) per day for seven (7) days, and at the end of those last seven (7) days, this is when the "Drug Holiday" would start.   Will I have withdrawals? By doing a "slow downward taper" like this one, it  is unlikely that you will experience any significant withdrawal symptoms. Typically, what triggers withdrawals is the sudden stop of a high dose opioid therapy. Withdrawals can usually be avoided by slowly decreasing the dose over a prolonged period of time. If you do not follow these instructions and decide to stop your medication abruptly, withdrawals may be possible.  What are withdrawals? Withdrawals: refers to the wide range of symptoms that occur after stopping or dramatically reducing opiate drugs after heavy and prolonged use. Withdrawal symptoms do not occur to patients that use low dose opioids, or those who take the medication sporadically. Contrary to benzodiazepine (example: Valium, Xanax, etc.) or alcohol withdrawals ("Delirium Tremens"), opioid withdrawals are not lethal. Withdrawals are the physical manifestation of the body getting rid of the excess receptors.  Expected Symptoms Early symptoms of withdrawal may include: Agitation Anxiety Muscle aches Increased tearing Insomnia Runny nose Sweating Yawning  Late symptoms of withdrawal may include: Abdominal cramping Diarrhea Dilated pupils Goose bumps Nausea Vomiting  Will I experience withdrawals? Due to the slow nature of the taper, it is very unlikely that you will experience any.  What is a slow taper? Taper: refers to the gradual decrease in dose.  (Last update: 09/22/2019) ____________________________________________________________________________________________    

## 2020-11-22 ENCOUNTER — Inpatient Hospital Stay: Payer: Medicare Other

## 2020-11-22 VITALS — BP 124/81 | HR 75 | Temp 96.0°F | Resp 18 | Wt 224.0 lb

## 2020-11-22 DIAGNOSIS — Z833 Family history of diabetes mellitus: Secondary | ICD-10-CM | POA: Diagnosis not present

## 2020-11-22 DIAGNOSIS — C3492 Malignant neoplasm of unspecified part of left bronchus or lung: Secondary | ICD-10-CM

## 2020-11-22 DIAGNOSIS — Z809 Family history of malignant neoplasm, unspecified: Secondary | ICD-10-CM | POA: Diagnosis not present

## 2020-11-22 DIAGNOSIS — Z79899 Other long term (current) drug therapy: Secondary | ICD-10-CM | POA: Diagnosis not present

## 2020-11-22 DIAGNOSIS — R7401 Elevation of levels of liver transaminase levels: Secondary | ICD-10-CM | POA: Diagnosis not present

## 2020-11-22 DIAGNOSIS — E119 Type 2 diabetes mellitus without complications: Secondary | ICD-10-CM | POA: Diagnosis not present

## 2020-11-22 DIAGNOSIS — Z8249 Family history of ischemic heart disease and other diseases of the circulatory system: Secondary | ICD-10-CM | POA: Diagnosis not present

## 2020-11-22 DIAGNOSIS — C3432 Malignant neoplasm of lower lobe, left bronchus or lung: Secondary | ICD-10-CM | POA: Diagnosis not present

## 2020-11-22 DIAGNOSIS — Z5112 Encounter for antineoplastic immunotherapy: Secondary | ICD-10-CM | POA: Diagnosis not present

## 2020-11-22 DIAGNOSIS — F17210 Nicotine dependence, cigarettes, uncomplicated: Secondary | ICD-10-CM | POA: Diagnosis not present

## 2020-11-22 MED ORDER — HEPARIN SOD (PORK) LOCK FLUSH 100 UNIT/ML IV SOLN
INTRAVENOUS | Status: AC
  Administered 2020-11-22: 500 [IU]
  Filled 2020-11-22: qty 5

## 2020-11-22 MED ORDER — SODIUM CHLORIDE 0.9 % IV SOLN
10.0000 mg/kg | Freq: Once | INTRAVENOUS | Status: AC
  Administered 2020-11-22: 1000 mg via INTRAVENOUS
  Filled 2020-11-22: qty 20

## 2020-11-22 MED ORDER — SODIUM CHLORIDE 0.9 % IV SOLN
Freq: Once | INTRAVENOUS | Status: AC
  Filled 2020-11-22: qty 250

## 2020-11-22 NOTE — Patient Instructions (Signed)
[facility]  Discharge Instructions: Thank you for choosing [facility] to provide your oncology and hematology care.  If you have a lab appointment with the [facility], please go directly to the [facility] and check in at the registration area.  Wear comfortable clothing and clothing appropriate for easy access to any Portacath or PICC line.   We strive to give you quality time with your provider. You may need to reschedule your appointment if you arrive late (15 or more minutes).  Arriving late affects you and other patients whose appointments are after yours.  Also, if you miss three or more appointments without notifying the office, you may be dismissed from the clinic at the provider's discretion.      For prescription refill requests, have your pharmacy contact our office and allow 72 hours for refills to be completed.    Today you received the following chemotherapy and/or immunotherapy agents : Imfinzi   To help prevent nausea and vomiting after your treatment, we encourage you to take your nausea medication as directed.  BELOW ARE SYMPTOMS THAT SHOULD BE REPORTED IMMEDIATELY: *FEVER GREATER THAN 100.4 F (38 C) OR HIGHER *CHILLS OR SWEATING *NAUSEA AND VOMITING THAT IS NOT CONTROLLED WITH YOUR NAUSEA MEDICATION *UNUSUAL SHORTNESS OF BREATH *UNUSUAL BRUISING OR BLEEDING *URINARY PROBLEMS (pain or burning when urinating, or frequent urination) *BOWEL PROBLEMS (unusual diarrhea, constipation, pain near the anus) TENDERNESS IN MOUTH AND THROAT WITH OR WITHOUT PRESENCE OF ULCERS (sore throat, sores in mouth, or a toothache) UNUSUAL RASH, SWELLING OR PAIN  UNUSUAL VAGINAL DISCHARGE OR ITCHING   Items with * indicate a potential emergency and should be followed up as soon as possible or go to the Emergency Department if any problems should occur.  Please show the CHEMOTHERAPY ALERT CARD or IMMUNOTHERAPY ALERT CARD at check-in to  the Emergency Department and triage nurse.  Should you have questions after your visit or need to cancel or reschedule your appointment, please contact [facility]  [phone] and follow the prompts.  Office hours are 8:00 a.m. to 4:30 p.m. Monday - Friday. Please note that voicemails left after 4:00 p.m. may not be returned until the following business day.  We are closed weekends and major holidays. You have access to a nurse at all times for urgent questions. Please call the main number to the clinic [phone] and follow the prompts.  For any non-urgent questions, you may also contact your provider using MyChart. We now offer e-Visits for anyone 6 and older to request care online for non-urgent symptoms. For details visit [email or web address].   Also download the MyChart app! Go to the app store, search "MyChart", open the app, select [facility], and log in with your MyChart username and password.  Due to Covid, a mask is required upon entering the hospital/clinic. If you do not have a mask, one will be given to you upon arrival. For doctor visits, patients may have 1 support person aged 59 or older with them. For treatment visits, patients cannot have anyone with them due to current Covid guidelines and our immunocompromised population.

## 2020-11-24 ENCOUNTER — Ambulatory Visit (INDEPENDENT_AMBULATORY_CARE_PROVIDER_SITE_OTHER): Payer: Medicare Other

## 2020-11-24 ENCOUNTER — Telehealth: Payer: Medicare Other | Admitting: General Practice

## 2020-11-24 DIAGNOSIS — C3490 Malignant neoplasm of unspecified part of unspecified bronchus or lung: Secondary | ICD-10-CM

## 2020-11-24 DIAGNOSIS — R918 Other nonspecific abnormal finding of lung field: Secondary | ICD-10-CM

## 2020-11-24 DIAGNOSIS — I10 Essential (primary) hypertension: Secondary | ICD-10-CM

## 2020-11-24 NOTE — Patient Instructions (Signed)
Visit Information  PATIENT GOALS:  Goals Addressed             This Visit's Progress    RNCM: -Track and Manage My Blood Pressure-Hypertension       Timeframe:  Long-Range Goal Priority:  High Start Date:     09-22-2020                        Expected End Date:      09-22-2021                 Follow Up Date: 01-12-2021   - check blood pressure daily - write blood pressure results in a log or diary    Why is this important?   You won't feel high blood pressure, but it can still hurt your blood vessels.  High blood pressure can cause heart or kidney problems. It can also cause a stroke.  Making lifestyle changes like losing a little weight or eating less salt will help.  Checking your blood pressure at home and at different times of the day can help to control blood pressure.  If the doctor prescribes medicine remember to take it the way the doctor ordered.  Call the office if you cannot afford the medicine or if there are questions about it.     Notes: 09-22-2020: On outreach today the patient states that he needed refills on his Metoprolol and his wife had called the office and that she had not received a call back. The patient ask the RNCM to call his wife. Call to his wife revealed that he had been taking Metoprolol succinate 100 mg BID and Metoprolol tartrate 50 mg BID. Message sent to Dr. Wynetta Emery who called RNCM, collaboration done and scripts for both medications sent to the pharmacy for refill. The patients wife called back and informed of refill information and to check blood pressures regularly. The wife states that the patients blood pressures are check daily and they have been within normal limits. Education on sx and sx of hypotension and what to look for. The patients wife verbalized understanding. Will continue to monitor. 11-24-2020: The patient is doing well. Is continuing to take chemo. Denies any issues with HTN.      RNCM: Lifestyle Change-Hypertension       Timeframe:   Short-Term Goal Priority:  Medium Start Date:  09-22-2020                           Expected End Date: 02-20-2021                      Follow Up Date 11/24/2020    - agree on reward when goals are met - agree to work together to make changes - ask questions to understand - have a family meeting to talk about healthy habits - learn about high blood pressure    Why is this important?   The changes that you are asked to make may be hard to do.  This is especially true when the changes are life-long.  Knowing why it is important to you is the first step.  Working on the change with your family or support person helps you not feel alone.  Reward yourself and family or support person when goals are met. This can be an activity you choose like bowling, hiking, biking, swimming or shooting hoops.     Notes: 09-22-2020: Review  of blood pressure goals and sx and sx of hypotension. 11-24-2020: The patient denies any issues with blood pressures. States he is doing well. Will continue to monitor for changes.         Patient verbalizes understanding of instructions provided today and agrees to view in [place].   Telephone follow up appointment with care management team member scheduled for: 01-12-2021 at 4 pm  Noreene Larsson RN, MSN, [facility] Mobile: [phone]

## 2020-11-24 NOTE — Chronic Care Management (AMB) (Signed)
Chronic Care Management   CCM RN Visit Note  11/24/2020 Name: Kristopher [name] MRN: [id number] DOB: [birth year]  Subjective: Kristopher [name] is a 60 y.o. year old male who is a primary care patient of [name], DO. The care management team was consulted for assistance with disease management and care coordination needs.    Engaged with patient by telephone for follow up visit in response to provider referral for case management and/or care coordination services.   Consent to Services:  The patient was given information about Chronic Care Management services, agreed to services, and gave verbal consent prior to initiation of services.  Please see initial visit note for detailed documentation.   Patient agreed to services and verbal consent obtained.   Assessment: Review of patient past medical history, allergies, medications, health status, including review of consultants reports, laboratory and other test data, was performed as part of comprehensive evaluation and provision of chronic care management services.   SDOH (Social Determinants of Health) assessments and interventions performed:  SDOH Interventions    Flowsheet Row Most Recent Value  SDOH Interventions   Financial Strain Interventions Other (Comment)  [having a hard time paying for hospital bills, has talked to billing department]  Social Connections Interventions Intervention Not Indicated        CCM Care Plan  Allergies  Allergen Reactions   Acetaminophen Swelling   Aspirin Anaphylaxis   Epinephrine Anaphylaxis    Does not include albuterol   Novocain [Procaine] Anaphylaxis   Penicillins Anaphylaxis    Has patient had a PCN reaction causing immediate rash, facial/tongue/throat swelling, SOB or lightheadedness with hypotension: Yes Has patient had a PCN reaction causing severe rash involving mucus membranes or skin necrosis: No Has patient had a PCN reaction that required hospitalization: Yes Has patient  had a PCN reaction occurring within the last 10 years: No If all of the above answers are "NO", then may proceed with Cephalosporin use.    Strawberry Extract Anaphylaxis   Shellfish Allergy Hives and Nausea And Vomiting    Outpatient Encounter Medications as of 11/24/2020  Medication Sig Note   Accu-Chek FastClix Lancets MISC USE TO TEST BLOOD SUGAR 2X A DAY    albuterol (VENTOLIN HFA) 108 (90 Base) MCG/ACT inhaler Inhale into the lungs.    amLODipine (NORVASC) 5 MG tablet Take 1 tablet (5 mg total) by mouth 2 (two) times daily.    atorvastatin (LIPITOR) 10 MG tablet Take 1 tablet (10 mg total) by mouth daily at 6 PM.    BREZTRI AEROSPHERE 160-9-4.8 MCG/ACT AERO Inhale 2 puffs into the lungs 2 (two) times daily. Maintenance per pulmonology    diphenhydrAMINE (BENADRYL) 25 MG tablet Take 50 mg by mouth daily.    esomeprazole (NEXIUM) 40 MG capsule Take 1 capsule (40 mg total) by mouth daily.    glucose blood (ACCU-CHEK GUIDE) test strip USE TO TEST BLOOD SUGAR 2X A DAY    hydrALAZINE (APRESOLINE) 100 MG tablet TAKE 1 TABLET(100 MG) BY MOUTH TWICE DAILY    JARDIANCE 25 MG TABS tablet TAKE 1 TABLET(25 MG) BY MOUTH DAILY BEFORE BREAKFAST    lidocaine (LIDODERM) 5 % Place 1 patch onto the skin daily. Remove & Discard patch within 12 hours or as directed by MD    lidocaine-prilocaine (EMLA) cream APPLY A SMALL AMOUNT TO PORT SITE AT LEAST 1 HOUR PRIOR TO IT BEING ACCESSED, COVER WITH PLASTIC WRAP    metFORMIN (GLUCOPHAGE) 500 MG tablet Take 2 tablets (1,000 mg total)  by mouth 2 (two) times daily with a meal. 07/12/2020: Takes 500 mg daily    metoprolol succinate (TOPROL-XL) 100 MG 24 hr tablet Take 1 tablet (100 mg total) by mouth 2 (two) times daily. Take with or immediately following a meal. To be taken with the 42m metoprolol tartrate    metoprolol tartrate (LOPRESSOR) 50 MG tablet Take 1 tablet (50 mg total) by mouth 2 (two) times daily. To be taken with the 1064mmetorprolol succinate.     montelukast (SINGULAIR) 10 MG tablet Take 10 mg by mouth daily.    morphine (MSIR) 15 MG tablet Take 1 tablet (15 mg total) by mouth every 6 (six) hours as needed for moderate pain or severe pain. Must last 30 days.    [START ON 12/20/2020] morphine (MSIR) 15 MG tablet Take 1 tablet (15 mg total) by mouth every 6 (six) hours as needed for moderate pain or severe pain. Must last 30 days.    [START ON 01/19/2021] morphine (MSIR) 15 MG tablet Take 1 tablet (15 mg total) by mouth every 6 (six) hours as needed for moderate pain or severe pain. Must last 30 days. 11/20/2020: WARNING: Not a Duplicate. Future prescription. DO NOT DELETE during hospital medication reconciliation or at discharge. [facility] Chronic Pain Management Patient    Multiple Vitamin (MULTIVITAMIN WITH MINERALS) TABS tablet Take 1 tablet by mouth daily.    NONFORMULARY OR COMPOUNDED ITEM Apply 1-2 mLs topically 4 (four) times daily as needed. 10% Ketamine/2% Cyclobenzaprine/6% Gabapentin Cream 11/20/2020: DO NOT DELETE during hospital medication reconciliation or at discharge. [facility] Chronic Pain Management Patient    nortriptyline (PAMELOR) 25 MG capsule Take 2 capsules (50 mg total) by mouth at bedtime.    Potassium Chloride ER 20 MEQ TBCR Take 1 tablet by mouth daily.    STIOLTO RESPIMAT 2.5-2.5 MCG/ACT AERS Inhale 2 puffs into the lungs 4 (four) times daily as needed. Prn per pulmonology notes    SUMAtriptan (IMITREX) 50 MG tablet Take 1 tablet (50 mg total) by mouth every 2 (two) hours as needed for migraine. Take 1 tab at onset of migraine. May repeat in 2 hours if headache persists or recurs.    Facility-Administered Encounter Medications as of 11/24/2020  Medication   0.9 %  sodium chloride infusion   sodium chloride flush (NS) 0.9 % injection 10 mL    Patient Active Problem List   Diagnosis Date Noted   Chronic use of opiate for therapeutic purpose 08/20/2020   Malignant neoplasm of lung ([facility]   Goals of care,  counseling/discussion 11/01/2019   Pulmonary emphysema (HCP[place]06/23/2021   OSA (obstructive sleep apnea) 06/16/2019   History of colonic polyps 02/02/2019   Shortness of breath 02/02/2019   Chronic upper extremity pain (3ry area of Pain) (Bilateral) (L>R) 01/19/2019   Squamous cell carcinoma of left lung ([facility] [place]11/14/2020   Lung mass 01/14/2019   Left lower lobe pulmonary nodule 10/26/2018   History of Allergy to amide type local anesthetic (Lidocaine) 10/21/2018    Class: History of   Hyperlipidemia associated with type 2 diabetes mellitus (HCF[place]05/18/2020   Type 2 diabetes mellitus with stage 1 chronic kidney disease, without long-term current use of insulin (HCRaw[place]10/29/2019   S/P hip replacement 12/02/2017   Hip arthritis 10/01/2017   Aortic atherosclerosis (HCLeland06/24/2019   Left-sided weakness 08/13/2017   Musculoskeletal pain, chronic 04/03/2017   History of tobacco abuse 06/24/2016   GERD (gastroesophageal reflux disease) 06/24/2016   Tobacco abuse 06/24/2016  Pharmacologic therapy    Benign neoplasm of ascending colon    Polyp of sigmoid colon    Rectal polyp    Benign hypertensive renal disease 01/22/2016   Migraine 01/22/2016   Chronic pain syndrome 01/11/2016   Chronic sacroiliac joint pain (Bilateral) (L>R) 05/31/2015   Chronic hip pain (Left) 05/31/2015   Lumbar facet syndrome (Bilateral) (L>R) 05/31/2015   Chronic lower extremity pain (2ry area of Pain) (Left) 05/31/2015   Greater occipital neuralgia (Right) 05/31/2015   Retrolisthesis of L5-S1 02/06/2015   Cervical disc herniation (C4-5 and C5-6) 02/06/2015   Lumbar disc herniation (L5-S1) 02/06/2015   Hypokalemia 02/01/2015   Cervical spinal stenosis (C4-5) 01/06/2015   Cervical foraminal stenosis (Bilateral C5-6) 01/06/2015   Chronic low back pain (1ry area of Pain) (Bilateral) (L>R) 01/05/2015   Lumbar spondylosis 01/05/2015   Chronic lumbar radicular pain (S1 dermatomal) (Left) 01/05/2015   Failed back  surgical syndrome (L5-S1 Laminectomy and Discectomy) 01/05/2015   Chronic neck pain (posterior midline) (Bilateral) (L>R) 01/05/2015   Cervical spondylosis 01/05/2015   Chronic cervical radicular pain (Bilateral) (C5/C6 dermatome) (L>R) 01/05/2015   Long term current use of opiate analgesic 01/05/2015   Long term prescription opiate use 01/05/2015   Opiate use (60 MME/Day) 95/63/8756   Uncomplicated opioid dependence ([place]) 01/05/2015    Conditions to be addressed/monitored:HTN and Lung cancer   Care Plan : RNCM: Cancer Treatment Phase (Adult)- Lung cancer  Updates made by Vanita Ingles, RN since 11/24/2020 12:00 AM     Problem: TNCM: Cancer treatment plan   Priority: High     Long-Range Goal: RNCM: Cancer treatment- actie phase of treatment   Start Date: 04/19/2020  Expected End Date: 08/08/2021  This Visit's Progress: On track  Recent Progress: On track  Priority: High  Note:   Current Barriers:  Knowledge Deficits related to treatment plan and how long treatments will last for current phase of lung cancer treatment Chronic Disease Management support and education needs related to effective management of lung cancer  Lacks caregiver support.  Unable to independently manage lung cancer to the left lung Lacks social connections Does not contact provider office for questions/concerns  Nurse Case Manager Clinical Goal(s):  patient will verbalize understanding of plan for effective management of lung cancer and continuing treatment regimen  patient will work with RNCM, pcp, and specialist  to address needs related to lung cancer and ongoing treatment  patient will demonstrate a decrease in side effects exacerbations as evidenced by maintaining weight, tolerating diet, taking medications to help with potential side effects and working with the CCM team to optimize health and well being patient will attend all scheduled medical appointments:03-06-2020 the patient will demonstrate ongoing  self health care management ability as evidenced by completion of chemotherapy and improved condition   Interventions:  1:1 collaboration with [name], DO regarding development and update of comprehensive plan of care as evidenced by provider attestation and co-signature Inter-disciplinary care team collaboration (see longitudinal plan of care) Evaluation of current treatment plan related to lung cancer  and patient's adherence to plan as established by provider. 07-21-2020: The patient saw the oncologist on 07-19-2020 and got a good report. The cancer is not completely gone but has seen a big improvement. The patient states that he has 23 more treatments and they will reevaluate. He says he wants as many as he needs because he is going to "beat" this. The patient is very optimistic and is following the plan of care. He  states he is sleeping well and eating well. He denies any acute side effects from his cancer treatments. He says he is doing very well according to his oncologist and happy with the results thus far. 09-22-2020: The patient states he feels great and is still taking his treatments without difficulty. The patient says he will know on 09-27-2020 what the plan for treatment will be and how many additional chemo treatments he will have to take next week. He says they want to make sure that they have eradicated all of the cancer cells. He states he is eating good and maintaining his weight. Is a little tired at times but he was working on his racecar at the time of the outreach call. Will continue to monitor for changes and new needs. 11-24-2020: The patient is continuing with treatments. The patient states he has been a little more tired the last couple of chemo treatments and came home and took a nap. Talked about pacing activity. He says he doesn't let it slow him down. He is helping his cousin put a new floor down. He is concerned about the hospital bills he is getting but he tries not to let  that bother him. He denies any acute distress. Will have a scan to check status of lung cancer on 12-07-2020. Emotional support and education given.  Advised patient to call the office for changes or questions, the patient is seeing specialist regularly  Provided education to patient re: maintaining weight, monitoring for side effects from chemotherapy, safety, adequate rest and working with the CCM team to optimize health and well being  Reviewed scheduled/upcoming provider appointments including: 03-06-2021 at 0840 am Discussed plans with patient for ongoing care management follow up and provided patient with direct contact information for care management team  Patient Goals/Self-Care Activities  patient will:  - Patient will self administer medications as prescribed Patient will attend all scheduled provider appointments Patient will call pharmacy for medication refills Patient will attend church or other social activities Patient will continue to perform ADL's independently Patient will continue to perform IADL's independently Patient will call provider office for new concerns or questions Patient will work with BSW to address care coordination needs and will continue to work with the clinical team to address health care and disease management related needs.   - alternating periods of activity with rest promoted - coagulation studies reviewed and trended - diet adjustment recommended - fall and injury prevention strategies promoted - gentle oral care promoted - individualized medical nutrition therapy provided - signs/symptoms of bleeding reviewed  Follow Up Plan: Telephone follow up appointment with care management team member scheduled for: 01-12-2021 at 230 pm        Care Plan : RNCM: Hypertension (Adult)  Updates made by Vanita Ingles, RN since 11/24/2020 12:00 AM     Problem: RNCM: Hypertension (Hypertension)   Priority: Medium     Long-Range Goal: RNCM: Hypertension  Monitored   Start Date: 04/19/2020  Expected End Date: 07/19/2021  This Visit's Progress: On track  Recent Progress: On track  Priority: High  Note:   Objective:  Last practice recorded BP readings:  BP Readings from Last 3 Encounters:  11/22/20 124/81  11/20/20 (!) 134/95  11/08/20 121/79   Most recent eGFR/CrCl:  Lab Results  Component Value Date   EGFR 99 09/01/2020    No components found for: CRCL Current Barriers:  Knowledge Deficits related to basic understanding of hypertension pathophysiology and self care management Knowledge Deficits  related to understanding of medications prescribed for management of hypertension Unable to independently HTN Needing clarification on medications. 09-22-2020- addressed today and clarification made. Collaboration with the pcp, RNCM, patient and patients wife Case Manager Clinical Goal(s):  patient will verbalize understanding of plan for hypertension management patient will attend all scheduled medical appointments: 03-06-2021 at 0840 am patient will demonstrate improved adherence to prescribed treatment plan for hypertension as evidenced by taking all medications as prescribed, monitoring and recording blood pressure as directed, adhering to low sodium/DASH diet patient will demonstrate improved health management independence as evidenced by checking blood pressure as directed and notifying PCP if SBP>160 or DBP > 90, taking all medications as prescribe, and adhering to a low sodium diet as discussed. patient will verbalize basic understanding of hypertension disease process and self health management plan as evidenced by compliance with medications, compliance with heart healthy/ADA diet, and working with the CCM team to optimize health and well being Interventions:  Collaboration with [name], DO regarding development and update of comprehensive plan of care as evidenced by provider attestation and co-signature Inter-disciplinary care  team collaboration (see longitudinal plan of care) Evaluation of current treatment plan related to hypertension self management and patient's adherence to plan as established by provider.  09-22-2020: On outreach today the patient states that he needed refills on his Metoprolol and his wife had called the office and that she had not received a call back. The patient ask the RNCM to call his wife. Call to his wife revealed that he had been taking Metoprolol succinate 100 mg BID and Metoprolol tartrate 50 mg BID. Message sent to Dr. Wynetta Emery who called RNCM, collaboration done and scripts for both medications sent to the pharmacy for refill. The patients wife was called back by the [facility] and informed of refill information and to check blood pressures regularly. The wife states that the patients blood pressures are check daily and they have been within normal limits. Education on sx and sx of hypotension and what to look for. The patients wife verbalized understanding. Will continue to monitor. 11-24-2020: The patient is doing well and denies any issues related to his blood pressures. States that his blood pressures are WNL.  Provided education to patient re: stroke prevention, s/s of heart attack and stroke, DASH diet, complications of uncontrolled blood pressure Reviewed medications with patient and discussed importance of compliance. 09-22-2020: Extensive review of medications and how the patient has been taking. Confirmation made by the patient and patients wife that he takes both Metoprolol tartrate and Metoprolol succinate. PCP has sent new scripts in for medications. Will continue to monitor for changes and educational needs. 11-24-2020: The patient states compliance with medications. Denies any needs related to medications at this time. Will continue to monitor.  Discussed plans with patient for ongoing care management follow up and provided patient with direct contact information for care management  team Advised patient, providing education and rationale, to monitor blood pressure daily and record, calling PCP for findings outside established parameters.  Reviewed scheduled/upcoming provider appointments including: 03-06-2021 at 0840 am Self-Care Activities: - Self administers medications as prescribed Attends all scheduled provider appointments Calls provider office for new concerns, questions, or BP outside discussed parameters Checks BP and records as discussed Follows a low sodium diet/DASH diet Patient Goals: - check blood pressure daily - write blood pressure results in a log or diary - agree on reward when goals are met - agree to work together to make changes -  ask questions to understand - have a family meeting to talk about healthy habits - learn about high blood pressure  Follow Up Plan: Telephone follow up appointment with care management team member scheduled for: 11-11--2022 at 230 pm     Plan:Telephone follow up appointment with care management team member scheduled for:  01-12-2021 at 34 pm  Noreene Larsson RN, MSN, [facility] Mobile: [phone]

## 2020-12-01 DIAGNOSIS — C3490 Malignant neoplasm of unspecified part of unspecified bronchus or lung: Secondary | ICD-10-CM | POA: Diagnosis not present

## 2020-12-01 DIAGNOSIS — I10 Essential (primary) hypertension: Secondary | ICD-10-CM

## 2020-12-04 NOTE — Progress Notes (Signed)
[place]  [phone] [phone]  ID: Kristopher [name] OB: [birth year]  MR#: [id number]  NKN#:[id number]  Patient Care Team: [name], DO as PCP - General (Family Medicine) Gavin Pound, [place] (Inactive) as Certified Medical Assistant Telford Nab, RN as Registered Nurse Vanita Ingles, RN as Case Manager (General Practice) [name], MD as Referring Physician (Pain Medicine) [name], MD as Consulting Physician (Oncology)  CHIEF COMPLAINT: Clinical stage IIIa squamous cell 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  INTERVAL HISTORY: Patient returns to clinic today for further evaluation and consideration of cycle 20 of maintenance durvalumab.  He continues to feel well and remains asymptomatic.  He is tolerating his treatments well without significant side effects.  He has a good appetite and denies weight loss.  He has no neurologic complaints.  He denies any recent fevers or illnesses.  He has no chest pain, shortness of breath, cough, or hemoptysis.  He denies any nausea, vomiting, constipation, or diarrhea.  He has no urinary complaints.  Patient offers no specific complaints today.  REVIEW OF SYSTEMS:   Review of Systems  Constitutional: Negative.  Negative for fever, malaise/fatigue and weight loss.  Respiratory: Negative.  Negative for cough and shortness of breath.   Cardiovascular: Negative.  Negative for chest pain and leg swelling.  Gastrointestinal: Negative.  Negative for abdominal pain and heartburn.  Genitourinary: Negative.  Negative for dysuria and flank pain.  Musculoskeletal: Negative.  Negative for back pain.  Skin: Negative.  Negative for rash.  Neurological: Negative.  Negative for dizziness, focal weakness, weakness and headaches.  Psychiatric/Behavioral: Negative.  The patient is not nervous/anxious.    As per HPI. Otherwise, a complete review of systems is negative.  PAST MEDICAL  HISTORY: Past Medical History:  Diagnosis Date   Allergy    Arthritis    left foot   Benign hypertensive kidney disease    Chronic back pain    Four rods in back   Diabetes mellitus, type 2 (HCC)    Dyspnea    GERD (gastroesophageal reflux disease)    Hypertension    Malignant neoplasm of lung (HCC)    Migraines    daily    PAST SURGICAL HISTORY: Past Surgical History:  Procedure Laterality Date   APPENDECTOMY     BACK SURGERY     COLONOSCOPY WITH PROPOFOL N/A 02/19/2016   Procedure: COLONOSCOPY WITH PROPOFOL;  Surgeon: Lucilla Lame, MD;  Location: [facility];  Service: Endoscopy;  Laterality: N/A;   COLONOSCOPY WITH PROPOFOL N/A 01/19/2018   Procedure: COLONOSCOPY WITH PROPOFOL;  Surgeon: Lucilla Lame, MD;  Location: [place];  Service: Endoscopy;  Laterality: N/A;  Diabetic - oral meds   DG OPERATIVE LEFT HIP ([place] HX)     10/19   ELECTROMAGNETIC NAVIGATION BROCHOSCOPY Left 11/18/2018   Procedure: ELECTROMAGNETIC NAVIGATION BRONCHOSCOPY;  Surgeon: Tyler Pita, MD;  Location: [facility];  Service: Cardiopulmonary;  Laterality: Left;   FLEXIBLE BRONCHOSCOPY Bilateral 01/20/2019   Procedure: FLEXIBLE BRONCHOSCOPY;  Surgeon: Ottie Glazier, MD;  Location: [facility];  Service: Thoracic;  Laterality: Bilateral;   FLEXIBLE BRONCHOSCOPY Bilateral 01/22/2019   Procedure: FLEXIBLE BRONCHOSCOPY;  Surgeon: Ottie Glazier, MD;  Location: [facility];  Service: Thoracic;  Laterality: Bilateral;   FOOT SURGERY Left    Screws and plates   JOINT REPLACEMENT Left 12/2017   DR Rudene Christians Hip   KNEE SURGERY Left    X 2   LEG SURGERY  LUNG CANCER SURGERY     POLYPECTOMY N/A 02/19/2016   Procedure: POLYPECTOMY;  Surgeon: Lucilla Lame, MD;  Location: [place];  Service: Endoscopy;  Laterality: N/A;   POLYPECTOMY  01/19/2018   Procedure: POLYPECTOMY;  Surgeon: Lucilla Lame, MD;  Location: [place];  Service: Endoscopy;;   PORTACATH PLACEMENT N/A  11/11/2019   Procedure: INSERTION PORT-A-CATH;  Surgeon: Nestor Lewandowsky, MD;  Location: [facility];  Service: General;  Laterality: N/A;   THORACOTOMY Left 01/14/2019   Procedure: THORACOTOMY MAJOR, LEFT;  Surgeon: Nestor Lewandowsky, MD;  Location: [facility];  Service: General;  Laterality: Left;   TOTAL HIP ARTHROPLASTY Left 12/02/2017   Procedure: TOTAL HIP ARTHROPLASTY ANTERIOR APPROACH;  Surgeon: Hessie Knows, MD;  Location: [facility];  Service: Orthopedics;  Laterality: Left;   VIDEO BRONCHOSCOPY Left 01/14/2019   Procedure: VIDEO BRONCHOSCOPY WITH FLUORO, LEFT;  Surgeon: Nestor Lewandowsky, MD;  Location: [facility];  Service: General;  Laterality: Left;   VIDEO BRONCHOSCOPY WITH ENDOBRONCHIAL NAVIGATION N/A 10/15/2019   Procedure: VIDEO BRONCHOSCOPY WITH ENDOBRONCHIAL NAVIGATION;  Surgeon: Ottie Glazier, MD;  Location: [facility];  Service: Thoracic;  Laterality: N/A;   VIDEO BRONCHOSCOPY WITH ENDOBRONCHIAL ULTRASOUND N/A 10/15/2019   Procedure: VIDEO BRONCHOSCOPY WITH ENDOBRONCHIAL ULTRASOUND;  Surgeon: Ottie Glazier, MD;  Location: [facility];  Service: Thoracic;  Laterality: N/A;    FAMILY HISTORY: Family History  Problem Relation Age of Onset   Cancer Father    Diabetes Sister    Thrombosis Sister     ADVANCED DIRECTIVES (Y/N):  N  HEALTH MAINTENANCE: Social History   Tobacco Use   Smoking status: Every Day    Packs/day: 0.25    Years: 35.00    Pack years: 8.75    Types: Cigarettes    Last attempt to quit: 01/14/2019    Years since quitting: 1.8   Smokeless tobacco: Never  Vaping Use   Vaping Use: Never used  Substance Use Topics   Alcohol use: No    Alcohol/week: 0.0 standard drinks   Drug use: Yes    Types: Morphine    Comment: prescribed     Colonoscopy:  PAP:  Bone density:  Lipid panel:  Allergies  Allergen Reactions   Acetaminophen Swelling   Aspirin Anaphylaxis   Epinephrine Anaphylaxis    Does not include albuterol   Novocain [Procaine] Anaphylaxis    Penicillins Anaphylaxis    Has patient had a PCN reaction causing immediate rash, facial/tongue/throat swelling, SOB or lightheadedness with hypotension: Yes Has patient had a PCN reaction causing severe rash involving mucus membranes or skin necrosis: No Has patient had a PCN reaction that required hospitalization: Yes Has patient had a PCN reaction occurring within the last 10 years: No If all of the above answers are "NO", then may proceed with Cephalosporin use.    Strawberry Extract Anaphylaxis   Shellfish Allergy Hives and Nausea And Vomiting    Current Outpatient Medications  Medication Sig Dispense Refill   Accu-Chek FastClix Lancets MISC USE TO TEST BLOOD SUGAR 2X A DAY 100 each 12   albuterol (VENTOLIN HFA) 108 (90 Base) MCG/ACT inhaler Inhale into the lungs.     amLODipine (NORVASC) 5 MG tablet Take 1 tablet (5 mg total) by mouth 2 (two) times daily. 180 tablet 1   atorvastatin (LIPITOR) 10 MG tablet Take 1 tablet (10 mg total) by mouth daily at 6 PM. 90 tablet 1   BREZTRI AEROSPHERE 160-9-4.8 MCG/ACT AERO Inhale 2 puffs into the lungs 2 (two) times daily.  Maintenance per pulmonology     diphenhydrAMINE (BENADRYL) 25 MG tablet Take 50 mg by mouth daily.     esomeprazole (NEXIUM) 40 MG capsule Take 1 capsule (40 mg total) by mouth daily. 90 capsule 3   glucose blood (ACCU-CHEK GUIDE) test strip USE TO TEST BLOOD SUGAR 2X A DAY 100 strip 3   hydrALAZINE (APRESOLINE) 100 MG tablet TAKE 1 TABLET(100 MG) BY MOUTH TWICE DAILY 180 tablet 1   JARDIANCE 25 MG TABS tablet TAKE 1 TABLET(25 MG) BY MOUTH DAILY BEFORE BREAKFAST 90 tablet 0   lidocaine (LIDODERM) 5 % Place 1 patch onto the skin daily. Remove & Discard patch within 12 hours or as directed by MD 30 patch 12   lidocaine-prilocaine (EMLA) cream APPLY A SMALL AMOUNT TO PORT SITE AT LEAST 1 HOUR PRIOR TO IT BEING ACCESSED, COVER WITH PLASTIC WRAP 30 g 1   metoprolol succinate (TOPROL-XL) 100 MG 24 hr tablet Take 1 tablet (100 mg  total) by mouth 2 (two) times daily. Take with or immediately following a meal. To be taken with the 11m metoprolol tartrate 180 tablet 1   metoprolol tartrate (LOPRESSOR) 50 MG tablet Take 1 tablet (50 mg total) by mouth 2 (two) times daily. To be taken with the 1065mmetorprolol succinate. 180 tablet 1   montelukast (SINGULAIR) 10 MG tablet Take 10 mg by mouth daily.     morphine (MSIR) 15 MG tablet Take 1 tablet (15 mg total) by mouth every 6 (six) hours as needed for moderate pain or severe pain. Must last 30 days. 120 tablet 0   [START ON 12/20/2020] morphine (MSIR) 15 MG tablet Take 1 tablet (15 mg total) by mouth every 6 (six) hours as needed for moderate pain or severe pain. Must last 30 days. 120 tablet 0   [START ON 01/19/2021] morphine (MSIR) 15 MG tablet Take 1 tablet (15 mg total) by mouth every 6 (six) hours as needed for moderate pain or severe pain. Must last 30 days. 120 tablet 0   Multiple Vitamin (MULTIVITAMIN WITH MINERALS) TABS tablet Take 1 tablet by mouth daily.     NONFORMULARY OR COMPOUNDED ITEM Apply 1-2 mLs topically 4 (four) times daily as needed. 10% Ketamine/2% Cyclobenzaprine/6% Gabapentin Cream 240 each PRN   Potassium Chloride ER 20 MEQ TBCR Take 1 tablet by mouth daily. 90 tablet 1   STIOLTO RESPIMAT 2.5-2.5 MCG/ACT AERS Inhale 2 puffs into the lungs 4 (four) times daily as needed. Prn per pulmonology notes     SUMAtriptan (IMITREX) 50 MG tablet Take 1 tablet (50 mg total) by mouth every 2 (two) hours as needed for migraine. Take 1 tab at onset of migraine. May repeat in 2 hours if headache persists or recurs. 10 tablet 12   metFORMIN (GLUCOPHAGE) 500 MG tablet Take 2 tablets (1,000 mg total) by mouth 2 (two) times daily with a meal. 360 tablet 1   nortriptyline (PAMELOR) 25 MG capsule Take 2 capsules (50 mg total) by mouth at bedtime. 180 capsule 1   No current facility-administered medications for this visit.   Facility-Administered Medications Ordered in Other  Visits  Medication Dose Route Frequency Provider Last Rate Last Admin   0.9 %  sodium chloride infusion    Continuous PRN PeDionne BucyCRNA   New Bag at 01/22/19 0705   sodium chloride flush (NS) 0.9 % injection 10 mL  10 mL Intracatheter PRN FiLloyd HugerMD   10 mL at 03/01/20 090938  OBJECTIVE: Vitals:  12/06/20 0938  BP: 123/84  Pulse: 62  Resp: 16  Temp: (!) 97.5 F (36.4 C)  SpO2: 100%     Body mass index is 32.21 kg/m.    ECOG FS:0 - Asymptomatic  General: Well-developed, well-nourished, no acute distress. Eyes: Pink conjunctiva, anicteric sclera. HEENT: Normocephalic, moist mucous membranes. Lungs: No audible wheezing or coughing. Heart: Regular rate and rhythm. Abdomen: Soft, nontender, no obvious distention. Musculoskeletal: No edema, cyanosis, or clubbing. Neuro: Alert, answering all questions appropriately. Cranial nerves grossly intact. Skin: No rashes or petechiae noted. Psych: Normal affect.  LAB RESULTS:  Lab Results  Component Value Date   NA 137 12/06/2020   K 3.6 12/06/2020   CL 102 12/06/2020   CO2 28 12/06/2020   GLUCOSE 165 (H) 12/06/2020   BUN 9 12/06/2020   CREATININE 0.75 12/06/2020   CALCIUM 9.1 12/06/2020   PROT 7.8 12/06/2020   ALBUMIN 4.4 12/06/2020   AST 47 (H) 12/06/2020   ALT 59 (H) 12/06/2020   ALKPHOS 122 12/06/2020   BILITOT 0.7 12/06/2020   GFRNONAA >60 12/06/2020   GFRAA >60 12/01/2019    Lab Results  Component Value Date   WBC 5.9 12/06/2020   NEUTROABS 3.4 12/06/2020   HGB 13.8 12/06/2020   HCT 41.7 12/06/2020   MCV 90.3 12/06/2020   PLT 169 12/06/2020     STUDIES: No results found.  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  PLAN:    1.  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  He had a complicated postoperative course. CT scan results from August 14, 2019 reviewed  independently with a suspicious subcarinal lymph node measuring 1.4 cm.  Follow-up PET scan on August 23, 2019 revealed hypermetabolism in lymph node highly suspicious for recurrence.  Biopsy confirmed recurrence increasing patient's stage from IA up to IIIA. Patient completed concurrent XRT and weekly carboplatinum and Taxol on January 05, 2020.  Patient initiated 1 year of maintenance immunotherapy using durvalumab on January 19, 2020.  Repeat CT scan from June 19, 2020 reviewed independently with resolution of mediastinal lymphadenopathy and no new or progressive findings.  Proceed with cycle 20 of treatment today.  Return to clinic in 2 weeks for treatment only and then in 4 weeks for further evaluation and consideration of cycle 22 of durvalumab. 2.  Pain: Patient does not complain of this today.  Unrelated to his malignancy.  Continue monitoring and treatment per pain clinic. 3.  Nausea: Resolved.  Previously, patient asked if it were okay to take CBD Gummies for nausea.  From an oncology standpoint this would be okay, but patient did state concern that it would show a positive urine test.  He was instructed to let pain clinic know that he was taking this for oncologic purposes. 4.  Chronic and unchanged.  I spent a total of 30 minutes reviewing chart data, face-to-face evaluation with the patient, counseling and coordination of care as detailed above.     Patient expressed understanding and was in agreement with this plan. He also understands that He can call clinic at any time with any questions, concerns, or complaints.   Cancer Staging Squamous cell carcinoma of left lung [facility]) Staging form: Lung, AJCC 8th Edition - Clinical stage from 02/12/2019: Stage IIIA (cT1b, cN2, cM0) - Signed by [name], MD on 11/01/2019   [name], MD   12/06/2020 3:07  PM

## 2020-12-06 ENCOUNTER — Other Ambulatory Visit: Payer: Self-pay | Admitting: Family Medicine

## 2020-12-06 ENCOUNTER — Inpatient Hospital Stay (HOSPITAL_BASED_OUTPATIENT_CLINIC_OR_DEPARTMENT_OTHER): Payer: Medicare Other | Admitting: Oncology

## 2020-12-06 ENCOUNTER — Inpatient Hospital Stay: Payer: Medicare Other | Attending: Oncology

## 2020-12-06 ENCOUNTER — Inpatient Hospital Stay: Payer: Medicare Other

## 2020-12-06 VITALS — BP 123/84 | HR 62 | Temp 97.5°F | Resp 16 | Wt 224.5 lb

## 2020-12-06 DIAGNOSIS — F17210 Nicotine dependence, cigarettes, uncomplicated: Secondary | ICD-10-CM | POA: Diagnosis not present

## 2020-12-06 DIAGNOSIS — Z809 Family history of malignant neoplasm, unspecified: Secondary | ICD-10-CM | POA: Diagnosis not present

## 2020-12-06 DIAGNOSIS — C3432 Malignant neoplasm of lower lobe, left bronchus or lung: Secondary | ICD-10-CM | POA: Diagnosis not present

## 2020-12-06 DIAGNOSIS — C3492 Malignant neoplasm of unspecified part of left bronchus or lung: Secondary | ICD-10-CM

## 2020-12-06 DIAGNOSIS — E119 Type 2 diabetes mellitus without complications: Secondary | ICD-10-CM | POA: Diagnosis not present

## 2020-12-06 DIAGNOSIS — M549 Dorsalgia, unspecified: Secondary | ICD-10-CM | POA: Diagnosis not present

## 2020-12-06 DIAGNOSIS — G8929 Other chronic pain: Secondary | ICD-10-CM | POA: Diagnosis not present

## 2020-12-06 DIAGNOSIS — Z833 Family history of diabetes mellitus: Secondary | ICD-10-CM | POA: Insufficient documentation

## 2020-12-06 DIAGNOSIS — Z79899 Other long term (current) drug therapy: Secondary | ICD-10-CM | POA: Diagnosis not present

## 2020-12-06 DIAGNOSIS — Z8249 Family history of ischemic heart disease and other diseases of the circulatory system: Secondary | ICD-10-CM | POA: Insufficient documentation

## 2020-12-06 LAB — CBC WITH DIFFERENTIAL/PLATELET
Abs Immature Granulocytes: 0.04 10*3/uL (ref 0.00–0.07)
Basophils Absolute: 0.1 10*3/uL (ref 0.0–0.1)
Basophils Relative: 1 %
Eosinophils Absolute: 0.1 10*3/uL (ref 0.0–0.5)
Eosinophils Relative: 2 %
HCT: 41.7 % (ref 39.0–52.0)
Hemoglobin: 13.8 g/dL (ref 13.0–17.0)
Immature Granulocytes: 1 %
Lymphocytes Relative: 29 %
Lymphs Abs: 1.7 10*3/uL (ref 0.7–4.0)
MCH: 29.9 pg (ref 26.0–34.0)
MCHC: 33.1 g/dL (ref 30.0–36.0)
MCV: 90.3 fL (ref 80.0–100.0)
Monocytes Absolute: 0.6 10*3/uL (ref 0.1–1.0)
Monocytes Relative: 9 %
Neutro Abs: 3.4 10*3/uL (ref 1.7–7.7)
Neutrophils Relative %: 58 %
Platelets: 169 10*3/uL (ref 150–400)
RBC: 4.62 MIL/uL (ref 4.22–5.81)
RDW: 13.7 % (ref 11.5–15.5)
WBC: 5.9 10*3/uL (ref 4.0–10.5)
nRBC: 0 % (ref 0.0–0.2)

## 2020-12-06 LAB — COMPREHENSIVE METABOLIC PANEL
ALT: 59 U/L — ABNORMAL HIGH (ref 0–44)
AST: 47 U/L — ABNORMAL HIGH (ref 15–41)
Albumin: 4.4 g/dL (ref 3.5–5.0)
Alkaline Phosphatase: 122 U/L (ref 38–126)
Anion gap: 7 (ref 5–15)
BUN: 9 mg/dL (ref 6–20)
CO2: 28 mmol/L (ref 22–32)
Calcium: 9.1 mg/dL (ref 8.9–10.3)
Chloride: 102 mmol/L (ref 98–111)
Creatinine, Ser: 0.75 mg/dL (ref 0.61–1.24)
GFR, Estimated: >60 mL/min (ref >60)
Glucose, Bld: 165 mg/dL — ABNORMAL HIGH (ref 70–99)
Potassium: 3.6 mmol/L (ref 3.5–5.1)
Sodium: 137 mmol/L (ref 135–145)
Total Bilirubin: 0.7 mg/dL (ref 0.3–1.2)
Total Protein: 7.8 g/dL (ref 6.5–8.1)

## 2020-12-06 MED ORDER — SODIUM CHLORIDE 0.9 % IV SOLN
Freq: Once | INTRAVENOUS | Status: AC
  Filled 2020-12-06: qty 250

## 2020-12-06 MED ORDER — SODIUM CHLORIDE 0.9 % IV SOLN
10.0000 mg/kg | Freq: Once | INTRAVENOUS | Status: AC
  Administered 2020-12-06: 1000 mg via INTRAVENOUS
  Filled 2020-12-06: qty 20

## 2020-12-06 MED ORDER — NORTRIPTYLINE HCL 25 MG PO CAPS: 50.0000 mg | ORAL_CAPSULE | Freq: Every day | ORAL | 1 refills | Status: DC

## 2020-12-06 MED ORDER — SODIUM CHLORIDE 0.9% FLUSH
10.0000 mL | Freq: Once | INTRAVENOUS | Status: AC
  Administered 2020-12-06: 10 mL via INTRAVENOUS
  Filled 2020-12-06: qty 10

## 2020-12-06 MED ORDER — METFORMIN HCL 500 MG PO TABS: 1000.0000 mg | ORAL_TABLET | Freq: Two times a day (BID) | ORAL | 1 refills | Status: DC

## 2020-12-06 MED ORDER — HEPARIN SOD (PORK) LOCK FLUSH 100 UNIT/ML IV SOLN
500.0000 [IU] | Freq: Once | INTRAVENOUS | Status: AC
  Administered 2020-12-06: 500 [IU] via INTRAVENOUS
  Filled 2020-12-06: qty 5

## 2020-12-06 NOTE — Progress Notes (Signed)
Pt complains of intermittent blurry vision for a couple weeks. Pt states it resolves quickly

## 2020-12-06 NOTE — Telephone Encounter (Signed)
Medication Refill - Medication: metFORMIN (GLUCOPHAGE) 500 MG tablet  nortriptyline (PAMELOR) 25 MG capsule  hydrALAZINE (APRESOLINE) 100 MG tablet   Has the patient contacted their pharmacy? Yes.   Contact PCP  Preferred Pharmacy (with phone number or street name):  [id number] [name], [place]  [address] [place], [place] [address]  Phone:  [phone]  Fax:  [phone]   Has the patient been seen for an appointment in the last year OR does the patient have an upcoming appointment? Yes.    Agent: Please be advised that RX refills may take up to 3 business days. We ask that you follow-up with your pharmacy.

## 2020-12-06 NOTE — Telephone Encounter (Signed)
Requested Prescriptions  Pending Prescriptions Disp Refills  . metFORMIN (GLUCOPHAGE) 500 MG tablet 360 tablet 1    Sig: Take 2 tablets (1,000 mg total) by mouth 2 (two) times daily with a meal.     Endocrinology:  Diabetes - Biguanides Passed - 12/06/2020 12:07 PM      Passed - Cr in normal range and within 360 days    Creatinine, Ser  Date Value Ref Range Status  12/06/2020 0.75 0.61 - 1.24 mg/dL Final         Passed - HBA1C is between 0 and 7.9 and within 180 days    HB A1C (BAYER DCA - WAIVED)  Date Value Ref Range Status  09/01/2020 6.1 <7.0 % Final    Comment:                                          Diabetic Adult            <7.0                                       Healthy Adult        4.3 - 5.7                                                           (DCCT/NGSP) American Diabetes Association's Summary of Glycemic Recommendations for Adults with Diabetes: Hemoglobin A1c <7.0%. More stringent glycemic goals (A1c <6.0%) may further reduce complications at the cost of increased risk of hypoglycemia.          Passed - eGFR in normal range and within 360 days    GFR calc Af Amer  Date Value Ref Range Status  12/01/2019 >60 >60 mL/min Final   GFR, Estimated  Date Value Ref Range Status  12/06/2020 >60 >60 mL/min Final    Comment:    (NOTE) Calculated using the CKD-EPI Creatinine Equation (2021)    eGFR  Date Value Ref Range Status  09/01/2020 99 >59 mL/min/1.73 Final         Passed - Valid encounter within last 6 months    Recent Outpatient Visits          3 months ago Essential hypertension   [place], [name], DO   6 months ago Type 2 diabetes mellitus with stage 1 chronic kidney disease, without long-term current use of insulin ([place])   [place], [name], DO   7 months ago Viral upper respiratory tract infection   [facility] Jon Billings, NP   9 months ago Type 2 diabetes mellitus with stage  1 chronic kidney disease, without long-term current use of insulin ([place])   [place], [name], DO   1 year ago Type 2 diabetes mellitus with stage 1 chronic kidney disease, without long-term current use of insulin ([place])   [place], [place], DO      Future Appointments            In 3 months Johnson, Barb Merino, DO [place], [place]   In 8  months  [facility], [name]           . nortriptyline (PAMELOR) 25 MG capsule 180 capsule 1    Sig: Take 2 capsules (50 mg total) by mouth at bedtime.     Psychiatry:  Antidepressants - Heterocyclics (TCAs) Passed - 12/06/2020 12:07 PM      Passed - Valid encounter within last 6 months    Recent Outpatient Visits          3 months ago Essential hypertension   [place], [name], DO   6 months ago Type 2 diabetes mellitus with stage 1 chronic kidney disease, without long-term current use of insulin ([place])   [place], [name], DO   7 months ago Viral upper respiratory tract infection   [facility] Jon Billings, NP   9 months ago Type 2 diabetes mellitus with stage 1 chronic kidney disease, without long-term current use of insulin ([place])   [place], [name], DO   1 year ago Type 2 diabetes mellitus with stage 1 chronic kidney disease, without long-term current use of insulin ([place])   [place], [place], DO      Future Appointments            In 3 months Johnson, Barb Merino, DO [place], [name]   In 8 months  [facility], [name]           Refused Prescriptions Disp Refills  . hydrALAZINE (APRESOLINE) 100 MG tablet 180 tablet 1    Sig: TAKE 1 TABLET(100 MG) BY MOUTH TWICE DAILY     Cardiovascular:  Vasodilators Passed - 12/06/2020 12:07 PM      Passed - HCT in normal range and within 360 days    HCT  Date Value Ref Range Status  12/06/2020 41.7  39.0 - 52.0 % Final   Hematocrit  Date Value Ref Range Status  04/27/2019 39.9 37.5 - 51.0 % Final         Passed - HGB in normal range and within 360 days    Hemoglobin  Date Value Ref Range Status  12/06/2020 13.8 13.0 - 17.0 g/dL Final  04/27/2019 13.3 13.0 - 17.7 g/dL Final         Passed - RBC in normal range and within 360 days    RBC  Date Value Ref Range Status  12/06/2020 4.62 4.22 - 5.81 MIL/uL Final         Passed - WBC in normal range and within 360 days    WBC  Date Value Ref Range Status  12/06/2020 5.9 4.0 - 10.5 K/uL Final         Passed - PLT in normal range and within 360 days    Platelets  Date Value Ref Range Status  12/06/2020 169 150 - 400 K/uL Final  04/27/2019 211 150 - 450 x10E3/uL Final         Passed - Last BP in normal range    BP Readings from Last 1 Encounters:  12/06/20 123/84         Passed - Valid encounter within last 12 months    Recent Outpatient Visits          3 months ago Essential hypertension   [place], [name], DO   6 months ago Type 2 diabetes mellitus with stage 1 chronic kidney disease, without long-term current use of insulin ([place])  [place], [name], DO   7 months ago Viral upper respiratory tract infection   [facility] Jon Billings, NP   9 months ago Type 2 diabetes mellitus with stage 1 chronic kidney disease, without long-term current use of insulin ([place])   [place], [name], DO   1 year ago Type 2 diabetes mellitus with stage 1 chronic kidney disease, without long-term current use of insulin ([place])   [place], [place], DO      Future Appointments            In 3 months Johnson, Barb Merino, DO [place], [place]   In 8 months  [facility], [name]

## 2020-12-06 NOTE — Patient Instructions (Signed)
[facility]  Discharge Instructions: Thank you for choosing [facility] to provide your oncology and hematology care.  If you have a lab appointment with the [facility], please go directly to the [facility] and check in at the registration area.  Wear comfortable clothing and clothing appropriate for easy access to any Portacath or PICC line.   We strive to give you quality time with your provider. You may need to reschedule your appointment if you arrive late (15 or more minutes).  Arriving late affects you and other patients whose appointments are after yours.  Also, if you miss three or more appointments without notifying the office, you may be dismissed from the clinic at the provider's discretion.      For prescription refill requests, have your pharmacy contact our office and allow 72 hours for refills to be completed.    Today you received the following chemotherapy and/or immunotherapy agents: Imfinzi      To help prevent nausea and vomiting after your treatment, we encourage you to take your nausea medication as directed.  BELOW ARE SYMPTOMS THAT SHOULD BE REPORTED IMMEDIATELY: *FEVER GREATER THAN 100.4 F (38 C) OR HIGHER *CHILLS OR SWEATING *NAUSEA AND VOMITING THAT IS NOT CONTROLLED WITH YOUR NAUSEA MEDICATION *UNUSUAL SHORTNESS OF BREATH *UNUSUAL BRUISING OR BLEEDING *URINARY PROBLEMS (pain or burning when urinating, or frequent urination) *BOWEL PROBLEMS (unusual diarrhea, constipation, pain near the anus) TENDERNESS IN MOUTH AND THROAT WITH OR WITHOUT PRESENCE OF ULCERS (sore throat, sores in mouth, or a toothache) UNUSUAL RASH, SWELLING OR PAIN  UNUSUAL VAGINAL DISCHARGE OR ITCHING   Items with * indicate a potential emergency and should be followed up as soon as possible or go to the Emergency Department if any problems should occur.  Please show the CHEMOTHERAPY ALERT CARD or IMMUNOTHERAPY ALERT CARD at check-in to  the Emergency Department and triage nurse.  Should you have questions after your visit or need to cancel or reschedule your appointment, please contact [facility]  [phone] and follow the prompts.  Office hours are 8:00 a.m. to 4:30 p.m. Monday - Friday. Please note that voicemails left after 4:00 p.m. may not be returned until the following business day.  We are closed weekends and major holidays. You have access to a nurse at all times for urgent questions. Please call the main number to the clinic [phone] and follow the prompts.  For any non-urgent questions, you may also contact your provider using MyChart. We now offer e-Visits for anyone 63 and older to request care online for non-urgent symptoms. For details visit [email or web address].   Also download the MyChart app! Go to the app store, search "MyChart", open the app, select [facility], and log in with your MyChart username and password.  Due to Covid, a mask is required upon entering the hospital/clinic. If you do not have a mask, one will be given to you upon arrival. For doctor visits, patients may have 1 support person aged 44 or older with them. For treatment visits, patients cannot have anyone with them due to current Covid guidelines and our immunocompromised population. Durvalumab injection What is this medication? DURVALUMAB (dur VAL ue mab) is a monoclonal antibody. It is used to treat lung cancer. This medicine may be used for other purposes; ask your health care provider or pharmacist if you have questions. COMMON BRAND NAME(S): IMFINZI What should I tell my care team before I take this medication?  They need to know if you have any of these conditions: autoimmune diseases like Crohn's disease, ulcerative colitis, or lupus have had or planning to have an allogeneic stem cell transplant (uses someone else's stem cells) history of organ transplant history of radiation to the  chest nervous system problems like myasthenia gravis or Guillain-Barre syndrome an unusual or allergic reaction to durvalumab, other medicines, foods, dyes, or preservatives pregnant or trying to get pregnant breast-feeding How should I use this medication? This medicine is for infusion into a vein. It is given by a health care professional in a hospital or clinic setting. A special MedGuide will be given to you before each treatment. Be sure to read this information carefully each time. Talk to your pediatrician regarding the use of this medicine in children. Special care may be needed. Overdosage: If you think you have taken too much of this medicine contact a poison control center or emergency room at once. NOTE: This medicine is only for you. Do not share this medicine with others. What if I miss a dose? It is important not to miss your dose. Call your doctor or health care professional if you are unable to keep an appointment. What may interact with this medication? Interactions have not been studied. This list may not describe all possible interactions. Give your health care provider a list of all the medicines, herbs, non-prescription drugs, or dietary supplements you use. Also tell them if you smoke, drink alcohol, or use illegal drugs. Some items may interact with your medicine. What should I watch for while using this medication? This drug may make you feel generally unwell. Continue your course of treatment even though you feel ill unless your doctor tells you to stop. You may need blood work done while you are taking this medicine. Do not become pregnant while taking this medicine or for 3 months after stopping it. Women should inform their doctor if they wish to become pregnant or think they might be pregnant. There is a potential for serious side effects to an unborn child. Talk to your health care professional or pharmacist for more information. Do not breast-feed an infant while  taking this medicine or for 3 months after stopping it. What side effects may I notice from receiving this medication? Side effects that you should report to your doctor or health care professional as soon as possible: allergic reactions like skin rash, itching or hives, swelling of the face, lips, or tongue black, tarry stools bloody or watery diarrhea breathing problems change in emotions or moods change in sex drive changes in vision chest pain or chest tightness chills confusion cough facial flushing fever headache signs and symptoms of high blood sugar such as dizziness; dry mouth; dry skin; fruity breath; nausea; stomach pain; increased hunger or thirst; increased urination signs and symptoms of liver injury like dark yellow or brown urine; general ill feeling or flu-like symptoms; light-colored stools; loss of appetite; nausea; right upper belly pain; unusually weak or tired; yellowing of the eyes or skin stomach pain trouble passing urine or change in the amount of urine weight gain or weight loss Side effects that usually do not require medical attention (report these to your doctor or health care professional if they continue or are bothersome): bone pain constipation loss of appetite muscle pain nausea swelling of the ankles, feet, hands tiredness This list may not describe all possible side effects. Call your doctor for medical advice about side effects. You may report side effects to  FDA at 1-800-FDA-1088. Where should I keep my medication? This drug is given in a hospital or clinic and will not be stored at home. NOTE: This sheet is a summary. It may not cover all possible information. If you have questions about this medicine, talk to your doctor, pharmacist, or health care provider.  2022 Elsevier/Gold Standard (2019-04-29 13:01:29)

## 2020-12-14 ENCOUNTER — Other Ambulatory Visit: Payer: Self-pay

## 2020-12-14 ENCOUNTER — Ambulatory Visit
Admission: RE | Admit: 2020-12-14 | Discharge: 2020-12-14 | Disposition: A | Discharge status: Home / Self Care | Payer: Medicare Other | Source: Ambulatory Visit | Attending: Radiation Oncology | Admitting: Radiation Oncology

## 2020-12-14 DIAGNOSIS — C3492 Malignant neoplasm of unspecified part of left bronchus or lung: Secondary | ICD-10-CM | POA: Diagnosis not present

## 2020-12-14 DIAGNOSIS — I700 Atherosclerosis of aorta: Secondary | ICD-10-CM | POA: Diagnosis not present

## 2020-12-14 DIAGNOSIS — R911 Solitary pulmonary nodule: Secondary | ICD-10-CM | POA: Diagnosis not present

## 2020-12-14 MED ORDER — IOHEXOL 350 MG/ML SOLN
75.0000 mL | Freq: Once | INTRAVENOUS | Status: AC | PRN
  Administered 2020-12-14: 75 mL via INTRAVENOUS

## 2020-12-20 ENCOUNTER — Inpatient Hospital Stay: Payer: Medicare Other

## 2020-12-20 ENCOUNTER — Other Ambulatory Visit: Payer: Self-pay

## 2020-12-20 VITALS — BP 132/91 | HR 73 | Temp 95.8°F | Resp 18

## 2020-12-20 DIAGNOSIS — C3492 Malignant neoplasm of unspecified part of left bronchus or lung: Secondary | ICD-10-CM

## 2020-12-20 DIAGNOSIS — Z79899 Other long term (current) drug therapy: Secondary | ICD-10-CM | POA: Diagnosis not present

## 2020-12-20 DIAGNOSIS — F17210 Nicotine dependence, cigarettes, uncomplicated: Secondary | ICD-10-CM | POA: Diagnosis not present

## 2020-12-20 DIAGNOSIS — Z809 Family history of malignant neoplasm, unspecified: Secondary | ICD-10-CM | POA: Diagnosis not present

## 2020-12-20 DIAGNOSIS — M549 Dorsalgia, unspecified: Secondary | ICD-10-CM | POA: Diagnosis not present

## 2020-12-20 DIAGNOSIS — G8929 Other chronic pain: Secondary | ICD-10-CM | POA: Diagnosis not present

## 2020-12-20 DIAGNOSIS — E119 Type 2 diabetes mellitus without complications: Secondary | ICD-10-CM | POA: Diagnosis not present

## 2020-12-20 DIAGNOSIS — C3432 Malignant neoplasm of lower lobe, left bronchus or lung: Secondary | ICD-10-CM | POA: Diagnosis not present

## 2020-12-20 DIAGNOSIS — Z95828 Presence of other vascular implants and grafts: Secondary | ICD-10-CM

## 2020-12-20 DIAGNOSIS — Z833 Family history of diabetes mellitus: Secondary | ICD-10-CM | POA: Diagnosis not present

## 2020-12-20 DIAGNOSIS — Z8249 Family history of ischemic heart disease and other diseases of the circulatory system: Secondary | ICD-10-CM | POA: Diagnosis not present

## 2020-12-20 LAB — COMPREHENSIVE METABOLIC PANEL
ALT: 65 U/L — ABNORMAL HIGH (ref 0–44)
AST: 55 U/L — ABNORMAL HIGH (ref 15–41)
Albumin: 4.3 g/dL (ref 3.5–5.0)
Alkaline Phosphatase: 118 U/L (ref 38–126)
Anion gap: 7 (ref 5–15)
BUN: 12 mg/dL (ref 6–20)
CO2: 27 mmol/L (ref 22–32)
Calcium: 9 mg/dL (ref 8.9–10.3)
Chloride: 98 mmol/L (ref 98–111)
Creatinine, Ser: 0.85 mg/dL (ref 0.61–1.24)
GFR, Estimated: >60 mL/min (ref >60)
Glucose, Bld: 258 mg/dL — ABNORMAL HIGH (ref 70–99)
Potassium: 3.6 mmol/L (ref 3.5–5.1)
Sodium: 132 mmol/L — ABNORMAL LOW (ref 135–145)
Total Bilirubin: 0.4 mg/dL (ref 0.3–1.2)
Total Protein: 7.7 g/dL (ref 6.5–8.1)

## 2020-12-20 LAB — CBC WITH DIFFERENTIAL/PLATELET
Abs Immature Granulocytes: 0.05 10*3/uL (ref 0.00–0.07)
Basophils Absolute: 0.1 10*3/uL (ref 0.0–0.1)
Basophils Relative: 1 %
Eosinophils Absolute: 0.1 10*3/uL (ref 0.0–0.5)
Eosinophils Relative: 2 %
HCT: 40.7 % (ref 39.0–52.0)
Hemoglobin: 13.5 g/dL (ref 13.0–17.0)
Immature Granulocytes: 1 %
Lymphocytes Relative: 28 %
Lymphs Abs: 1.8 10*3/uL (ref 0.7–4.0)
MCH: 29.7 pg (ref 26.0–34.0)
MCHC: 33.2 g/dL (ref 30.0–36.0)
MCV: 89.5 fL (ref 80.0–100.0)
Monocytes Absolute: 0.5 10*3/uL (ref 0.1–1.0)
Monocytes Relative: 8 %
Neutro Abs: 3.9 10*3/uL (ref 1.7–7.7)
Neutrophils Relative %: 60 %
Platelets: 168 10*3/uL (ref 150–400)
RBC: 4.55 MIL/uL (ref 4.22–5.81)
RDW: 13.5 % (ref 11.5–15.5)
WBC: 6.4 10*3/uL (ref 4.0–10.5)
nRBC: 0 % (ref 0.0–0.2)

## 2020-12-20 MED ORDER — SODIUM CHLORIDE 0.9% FLUSH
10.0000 mL | Freq: Once | INTRAVENOUS | Status: AC
Start: 2020-12-20 — End: 2020-12-20
  Administered 2020-12-20: 10 mL via INTRAVENOUS
  Filled 2020-12-20: qty 10

## 2020-12-20 MED ORDER — SODIUM CHLORIDE 0.9 % IV SOLN
10.0000 mg/kg | Freq: Once | INTRAVENOUS | Status: AC
  Administered 2020-12-20: 1000 mg via INTRAVENOUS
  Filled 2020-12-20: qty 20

## 2020-12-20 MED ORDER — HEPARIN SOD (PORK) LOCK FLUSH 100 UNIT/ML IV SOLN
INTRAVENOUS | Status: AC
  Administered 2020-12-20: 500 [IU] via INTRAVENOUS
  Filled 2020-12-20: qty 5

## 2020-12-20 MED ORDER — HEPARIN SOD (PORK) LOCK FLUSH 100 UNIT/ML IV SOLN
500.0000 [IU] | Freq: Once | INTRAVENOUS | Status: AC
  Filled 2020-12-20: qty 5

## 2020-12-20 MED ORDER — HEPARIN SOD (PORK) LOCK FLUSH 100 UNIT/ML IV SOLN
500.0000 [IU] | Freq: Once | INTRAVENOUS | Status: DC | PRN
  Filled 2020-12-20: qty 5

## 2020-12-20 MED ORDER — SODIUM CHLORIDE 0.9 % IV SOLN
Freq: Once | INTRAVENOUS | Status: AC
  Filled 2020-12-20: qty 250

## 2020-12-20 NOTE — Patient Instructions (Signed)
[facility]  Discharge Instructions: Thank you for choosing [facility] to provide your oncology and hematology care.  If you have a lab appointment with the [facility], please go directly to the [facility] and check in at the registration area.  Wear comfortable clothing and clothing appropriate for easy access to any Portacath or PICC line.   We strive to give you quality time with your provider. You may need to reschedule your appointment if you arrive late (15 or more minutes).  Arriving late affects you and other patients whose appointments are after yours.  Also, if you miss three or more appointments without notifying the office, you may be dismissed from the clinic at the provider's discretion.      For prescription refill requests, have your pharmacy contact our office and allow 72 hours for refills to be completed.    Today you received the following chemotherapy and/or immunotherapy agents DURVALUMAB      To help prevent nausea and vomiting after your treatment, we encourage you to take your nausea medication as directed.  BELOW ARE SYMPTOMS THAT SHOULD BE REPORTED IMMEDIATELY: *FEVER GREATER THAN 100.4 F (38 C) OR HIGHER *CHILLS OR SWEATING *NAUSEA AND VOMITING THAT IS NOT CONTROLLED WITH YOUR NAUSEA MEDICATION *UNUSUAL SHORTNESS OF BREATH *UNUSUAL BRUISING OR BLEEDING *URINARY PROBLEMS (pain or burning when urinating, or frequent urination) *BOWEL PROBLEMS (unusual diarrhea, constipation, pain near the anus) TENDERNESS IN MOUTH AND THROAT WITH OR WITHOUT PRESENCE OF ULCERS (sore throat, sores in mouth, or a toothache) UNUSUAL RASH, SWELLING OR PAIN  UNUSUAL VAGINAL DISCHARGE OR ITCHING   Items with * indicate a potential emergency and should be followed up as soon as possible or go to the Emergency Department if any problems should occur.  Please show the CHEMOTHERAPY ALERT CARD or IMMUNOTHERAPY ALERT CARD at check-in  to the Emergency Department and triage nurse.  Should you have questions after your visit or need to cancel or reschedule your appointment, please contact [facility]  [phone] and follow the prompts.  Office hours are 8:00 a.m. to 4:30 p.m. Monday - Friday. Please note that voicemails left after 4:00 p.m. may not be returned until the following business day.  We are closed weekends and major holidays. You have access to a nurse at all times for urgent questions. Please call the main number to the clinic [phone] and follow the prompts.  For any non-urgent questions, you may also contact your provider using MyChart. We now offer e-Visits for anyone 12 and older to request care online for non-urgent symptoms. For details visit [email or web address].   Also download the MyChart app! Go to the app store, search "MyChart", open the app, select [facility], and log in with your MyChart username and password.  Due to Covid, a mask is required upon entering the hospital/clinic. If you do not have a mask, one will be given to you upon arrival. For doctor visits, patients may have 1 support person aged 73 or older with them. For treatment visits, patients cannot have anyone with them due to current Covid guidelines and our immunocompromised population.   Durvalumab injection What is this medication? DURVALUMAB (dur VAL ue mab) is a monoclonal antibody. It is used to treat lung cancer. This medicine may be used for other purposes; ask your health care provider or pharmacist if you have questions. COMMON BRAND NAME(S): IMFINZI What should I tell my care team before I take  this medication? They need to know if you have any of these conditions: autoimmune diseases like Crohn's disease, ulcerative colitis, or lupus have had or planning to have an allogeneic stem cell transplant (uses someone else's stem cells) history of organ transplant history of radiation to the  chest nervous system problems like myasthenia gravis or Guillain-Barre syndrome an unusual or allergic reaction to durvalumab, other medicines, foods, dyes, or preservatives pregnant or trying to get pregnant breast-feeding How should I use this medication? This medicine is for infusion into a vein. It is given by a health care professional in a hospital or clinic setting. A special MedGuide will be given to you before each treatment. Be sure to read this information carefully each time. Talk to your pediatrician regarding the use of this medicine in children. Special care may be needed. Overdosage: If you think you have taken too much of this medicine contact a poison control center or emergency room at once. NOTE: This medicine is only for you. Do not share this medicine with others. What if I miss a dose? It is important not to miss your dose. Call your doctor or health care professional if you are unable to keep an appointment. What may interact with this medication? Interactions have not been studied. This list may not describe all possible interactions. Give your health care provider a list of all the medicines, herbs, non-prescription drugs, or dietary supplements you use. Also tell them if you smoke, drink alcohol, or use illegal drugs. Some items may interact with your medicine. What should I watch for while using this medication? This drug may make you feel generally unwell. Continue your course of treatment even though you feel ill unless your doctor tells you to stop. You may need blood work done while you are taking this medicine. Do not become pregnant while taking this medicine or for 3 months after stopping it. Women should inform their doctor if they wish to become pregnant or think they might be pregnant. There is a potential for serious side effects to an unborn child. Talk to your health care professional or pharmacist for more information. Do not breast-feed an infant while  taking this medicine or for 3 months after stopping it. What side effects may I notice from receiving this medication? Side effects that you should report to your doctor or health care professional as soon as possible: allergic reactions like skin rash, itching or hives, swelling of the face, lips, or tongue black, tarry stools bloody or watery diarrhea breathing problems change in emotions or moods change in sex drive changes in vision chest pain or chest tightness chills confusion cough facial flushing fever headache signs and symptoms of high blood sugar such as dizziness; dry mouth; dry skin; fruity breath; nausea; stomach pain; increased hunger or thirst; increased urination signs and symptoms of liver injury like dark yellow or brown urine; general ill feeling or flu-like symptoms; light-colored stools; loss of appetite; nausea; right upper belly pain; unusually weak or tired; yellowing of the eyes or skin stomach pain trouble passing urine or change in the amount of urine weight gain or weight loss Side effects that usually do not require medical attention (report these to your doctor or health care professional if they continue or are bothersome): bone pain constipation loss of appetite muscle pain nausea swelling of the ankles, feet, hands tiredness This list may not describe all possible side effects. Call your doctor for medical advice about side effects. You may report side  effects to FDA at 1-800-FDA-1088. Where should I keep my medication? This drug is given in a hospital or clinic and will not be stored at home. NOTE: This sheet is a summary. It may not cover all possible information. If you have questions about this medicine, talk to your doctor, pharmacist, or health care provider.  2022 Elsevier/Gold Standard (2019-04-29 13:01:29)

## 2020-12-21 ENCOUNTER — Encounter: Payer: Self-pay | Admitting: Radiation Oncology

## 2020-12-21 ENCOUNTER — Ambulatory Visit
Admission: RE | Admit: 2020-12-21 | Discharge: 2020-12-21 | Disposition: A | Discharge status: Home / Self Care | Payer: Medicare Other | Source: Ambulatory Visit | Attending: Radiation Oncology | Admitting: Radiation Oncology

## 2020-12-21 ENCOUNTER — Other Ambulatory Visit: Payer: Self-pay | Admitting: Family Medicine

## 2020-12-21 VITALS — BP 136/88 | HR 75 | Temp 96.0°F | Resp 16 | Wt 228.1 lb

## 2020-12-21 DIAGNOSIS — Z85118 Personal history of other malignant neoplasm of bronchus and lung: Secondary | ICD-10-CM | POA: Diagnosis not present

## 2020-12-21 DIAGNOSIS — C3492 Malignant neoplasm of unspecified part of left bronchus or lung: Secondary | ICD-10-CM

## 2020-12-21 DIAGNOSIS — Z08 Encounter for follow-up examination after completed treatment for malignant neoplasm: Secondary | ICD-10-CM | POA: Diagnosis not present

## 2020-12-21 LAB — TSH: TSH: 1.531 u[IU]/mL (ref 0.350–4.500)

## 2020-12-21 LAB — T4: T4, Total: 7.2 ug/dL (ref 4.5–12.0)

## 2020-12-21 NOTE — Progress Notes (Signed)
Radiation Oncology Follow up Note  Name: Kristopher [name]   Date:   12/21/2020 MRN:  [id number] DOB: 05/23/1960    This 60 y.o. 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  He had a complicated postoperative course and a suspicious subcarinal lymph node measuring 1.4 cm developed in June 2021.  PET scan showed this lymph node to be highly suspicious for recurrence being hypermetabolic and biopsy confirmed squamous cell carcinoma.  He underwent concurrent chemoradiation with weekly carboplatinum and Taxol now out 1 year.  He is doing well.  He is currently on maintenance durvalumab which she is tolerating well.  He specifically [place] cough hemoptysis or chest tightness.  Recent CT scan showed status post left lower lobectomy with mild chronic areas of postradiation fibrosis.  No evidence to suggest metastatic disease or progression of of his disease in his chest.  He has a stable 4 mm right upper lobe pulmonary nodule considered benign.  COMPLICATIONS OF TREATMENT: none  FOLLOW UP COMPLIANCE: keeps appointments   PHYSICAL EXAM:  BP 136/88 (BP Location: Left Arm, Patient Position: Sitting)   Pulse 75   Temp (!) 96 F (35.6 C) (Tympanic)   Resp 16   Wt 228 lb 1.6 oz (103.5 kg)   BMI 32.73 kg/m  Well-developed well-nourished patient in NAD. HEENT reveals PERLA, EOMI, discs not visualized.  Oral cavity is clear. No oral mucosal lesions are identified. Neck is clear without evidence of cervical or supraclavicular adenopathy. Lungs are clear to A&P. Cardiac examination is essentially unremarkable with regular rate and rhythm without murmur rub or thrill. Abdomen is benign with no organomegaly or masses  noted. Motor sensory and DTR levels are equal and symmetric in the upper and lower extremities. Cranial nerves II through XII are grossly intact. Proprioception is intact. No peripheral adenopathy or edema is identified. No motor or sensory levels are noted. Crude visual fields are within normal range.  RADIOLOGY RESULTS: CT scans reviewed compatible with above-stated findings.  PLAN: Present time patient continues to do well with no evidence of disease.  He continues on maintenance immunotherapy with the durvalumab which she is tolerating well.  And pleased with his overall progress.  Of asked to see him back in 6 months for follow-up.  He continues close follow-up care and treatment through medical oncology.  Patient knows to call with any concerns.  I would like to take this opportunity to thank you for allowing me to participate in the care of your patient.Noreene Filbert, MD

## 2020-12-24 ENCOUNTER — Other Ambulatory Visit: Payer: Self-pay | Admitting: Family Medicine

## 2020-12-28 NOTE — Progress Notes (Signed)
[name]  [phone] [phone]) [phone]  ID: Kristopher [name] OB: October 23, 1960  MR#: [id number]  OBS#:[id number]  Patient Care Team: [name], DO as PCP - General (Family Medicine) Gavin Pound, [place] (Inactive) as Certified Medical Assistant Telford Nab, RN as Registered Nurse Vanita Ingles, RN as Case Manager (General Practice) [name], MD as Referring Physician (Pain Medicine) [name], MD as Consulting Physician (Oncology)  CHIEF COMPLAINT: Clinical stage IIIa squamous cell 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  INTERVAL HISTORY: Patient returns to clinic today for further evaluation, discussion of his imaging results, and consideration of cycle 22 of maintenance durvalumab.  He continues to feel well and remains asymptomatic.  He continues to tolerate his treatments without significant side effects.  He has a good appetite and denies weight loss.  He has no neurologic complaints.  He denies any recent fevers or illnesses.  He has no chest pain, shortness of breath, cough, or hemoptysis.  He denies any nausea, vomiting, constipation, or diarrhea.  He has no urinary complaints.  Patient offers no specific complaints today.  REVIEW OF SYSTEMS:   Review of Systems  Constitutional: Negative.  Negative for fever, malaise/fatigue and weight loss.  Respiratory: Negative.  Negative for cough and shortness of breath.   Cardiovascular: Negative.  Negative for chest pain and leg swelling.  Gastrointestinal: Negative.  Negative for abdominal pain and heartburn.  Genitourinary: Negative.  Negative for dysuria and flank pain.  Musculoskeletal: Negative.  Negative for back pain.  Skin: Negative.  Negative for rash.  Neurological: Negative.  Negative for dizziness, focal weakness, weakness and headaches.  Psychiatric/Behavioral: Negative.  The patient is not nervous/anxious.    As per HPI. Otherwise, a complete review of  systems is negative.  PAST MEDICAL HISTORY: Past Medical History:  Diagnosis Date   Allergy    Arthritis    left foot   Benign hypertensive kidney disease    Chronic back pain    Four rods in back   Diabetes mellitus, type 2 (HCC)    Dyspnea    GERD (gastroesophageal reflux disease)    Hypertension    Malignant neoplasm of lung (HCC)    Migraines    daily    PAST SURGICAL HISTORY: Past Surgical History:  Procedure Laterality Date   APPENDECTOMY     BACK SURGERY     COLONOSCOPY WITH PROPOFOL N/A 02/19/2016   Procedure: COLONOSCOPY WITH PROPOFOL;  Surgeon: Lucilla Lame, MD;  Location: [facility];  Service: Endoscopy;  Laterality: N/A;   COLONOSCOPY WITH PROPOFOL N/A 01/19/2018   Procedure: COLONOSCOPY WITH PROPOFOL;  Surgeon: Lucilla Lame, MD;  Location: [facility];  Service: Endoscopy;  Laterality: N/A;  Diabetic - oral meds   DG OPERATIVE LEFT HIP ([place] HX)     10/19   ELECTROMAGNETIC NAVIGATION BROCHOSCOPY Left 11/18/2018   Procedure: ELECTROMAGNETIC NAVIGATION BRONCHOSCOPY;  Surgeon: Tyler Pita, MD;  Location: [facility];  Service: Cardiopulmonary;  Laterality: Left;   FLEXIBLE BRONCHOSCOPY Bilateral 01/20/2019   Procedure: FLEXIBLE BRONCHOSCOPY;  Surgeon: Ottie Glazier, MD;  Location: [facility];  Service: Thoracic;  Laterality: Bilateral;   FLEXIBLE BRONCHOSCOPY Bilateral 01/22/2019   Procedure: FLEXIBLE BRONCHOSCOPY;  Surgeon: Ottie Glazier, MD;  Location: [facility];  Service: Thoracic;  Laterality: Bilateral;   FOOT SURGERY Left    Screws and plates   JOINT REPLACEMENT Left 12/2017   DR Rudene Christians Hip   KNEE SURGERY Left    X 2  LEG SURGERY     LUNG CANCER SURGERY     POLYPECTOMY N/A 02/19/2016   Procedure: POLYPECTOMY;  Surgeon: Lucilla Lame, MD;  Location: [facility];  Service: Endoscopy;  Laterality: N/A;   POLYPECTOMY  01/19/2018   Procedure: POLYPECTOMY;  Surgeon: Lucilla Lame, MD;  Location: [facility];  Service:  Endoscopy;;   PORTACATH PLACEMENT N/A 11/11/2019   Procedure: INSERTION PORT-A-CATH;  Surgeon: Nestor Lewandowsky, MD;  Location: [facility];  Service: General;  Laterality: N/A;   THORACOTOMY Left 01/14/2019   Procedure: THORACOTOMY MAJOR, LEFT;  Surgeon: Nestor Lewandowsky, MD;  Location: [facility];  Service: General;  Laterality: Left;   TOTAL HIP ARTHROPLASTY Left 12/02/2017   Procedure: TOTAL HIP ARTHROPLASTY ANTERIOR APPROACH;  Surgeon: Hessie Knows, MD;  Location: [facility];  Service: Orthopedics;  Laterality: Left;   VIDEO BRONCHOSCOPY Left 01/14/2019   Procedure: VIDEO BRONCHOSCOPY WITH FLUORO, LEFT;  Surgeon: Nestor Lewandowsky, MD;  Location: [facility];  Service: General;  Laterality: Left;   VIDEO BRONCHOSCOPY WITH ENDOBRONCHIAL NAVIGATION N/A 10/15/2019   Procedure: VIDEO BRONCHOSCOPY WITH ENDOBRONCHIAL NAVIGATION;  Surgeon: Ottie Glazier, MD;  Location: [facility];  Service: Thoracic;  Laterality: N/A;   VIDEO BRONCHOSCOPY WITH ENDOBRONCHIAL ULTRASOUND N/A 10/15/2019   Procedure: VIDEO BRONCHOSCOPY WITH ENDOBRONCHIAL ULTRASOUND;  Surgeon: Ottie Glazier, MD;  Location: [facility];  Service: Thoracic;  Laterality: N/A;    FAMILY HISTORY: Family History  Problem Relation Age of Onset   Cancer Father    Diabetes Sister    Thrombosis Sister     ADVANCED DIRECTIVES (Y/N):  N  HEALTH MAINTENANCE: Social History   Tobacco Use   Smoking status: Every Day    Packs/day: 0.25    Years: 35.00    Pack years: 8.75    Types: Cigarettes    Last attempt to quit: 01/14/2019    Years since quitting: 1.9   Smokeless tobacco: Never  Vaping Use   Vaping Use: Never used  Substance Use Topics   Alcohol use: No    Alcohol/week: 0.0 standard drinks   Drug use: Yes    Types: Morphine    Comment: prescribed     Colonoscopy:  PAP:  Bone density:  Lipid panel:  Allergies  Allergen Reactions   Acetaminophen Swelling   Aspirin Anaphylaxis   Epinephrine Anaphylaxis    Does not include albuterol    Novocain [Procaine] Anaphylaxis   Penicillins Anaphylaxis    Has patient had a PCN reaction causing immediate rash, facial/tongue/throat swelling, SOB or lightheadedness with hypotension: Yes Has patient had a PCN reaction causing severe rash involving mucus membranes or skin necrosis: No Has patient had a PCN reaction that required hospitalization: Yes Has patient had a PCN reaction occurring within the last 10 years: No If all of the above answers are "NO", then may proceed with Cephalosporin use.    Strawberry Extract Anaphylaxis   Shellfish Allergy Hives and Nausea And Vomiting    Current Outpatient Medications  Medication Sig Dispense Refill   Accu-Chek FastClix Lancets MISC USE TO TEST BLOOD SUGAR 2X A DAY 100 each 12   albuterol (VENTOLIN HFA) 108 (90 Base) MCG/ACT inhaler Inhale into the lungs.     amLODipine (NORVASC) 5 MG tablet TAKE 1 TABLET(5 MG) BY MOUTH TWICE DAILY 180 tablet 0   atorvastatin (LIPITOR) 10 MG tablet Take 1 tablet (10 mg total) by mouth daily at 6 PM. 90 tablet 1   BREZTRI AEROSPHERE 160-9-4.8 MCG/ACT AERO Inhale 2 puffs into the lungs 2 (two)  times daily. Maintenance per pulmonology     diphenhydrAMINE (BENADRYL) 25 MG tablet Take 50 mg by mouth daily.     esomeprazole (NEXIUM) 40 MG capsule Take 1 capsule (40 mg total) by mouth daily. 90 capsule 3   glucose blood (ACCU-CHEK GUIDE) test strip USE TO TEST BLOOD SUGAR 2X A DAY 100 strip 3   hydrALAZINE (APRESOLINE) 100 MG tablet TAKE 1 TABLET(100 MG) BY MOUTH TWICE DAILY 180 tablet 1   JARDIANCE 25 MG TABS tablet TAKE 1 TABLET(25 MG) BY MOUTH DAILY BEFORE BREAKFAST 90 tablet 0   lidocaine (LIDODERM) 5 % Place 1 patch onto the skin daily. Remove & Discard patch within 12 hours or as directed by MD 30 patch 12   lidocaine-prilocaine (EMLA) cream APPLY A SMALL AMOUNT TO PORT SITE AT LEAST 1 HOUR PRIOR TO IT BEING ACCESSED, COVER WITH PLASTIC WRAP 30 g 1   metFORMIN (GLUCOPHAGE) 500 MG tablet Take 2 tablets (1,000 mg  total) by mouth 2 (two) times daily with a meal. 360 tablet 1   metoprolol succinate (TOPROL-XL) 100 MG 24 hr tablet Take 1 tablet (100 mg total) by mouth 2 (two) times daily. Take with or immediately following a meal. To be taken with the 50m metoprolol tartrate 180 tablet 1   metoprolol tartrate (LOPRESSOR) 50 MG tablet Take 1 tablet (50 mg total) by mouth 2 (two) times daily. To be taken with the 1078mmetorprolol succinate. 180 tablet 1   montelukast (SINGULAIR) 10 MG tablet Take 10 mg by mouth daily.     morphine (MSIR) 15 MG tablet Take 1 tablet (15 mg total) by mouth every 6 (six) hours as needed for moderate pain or severe pain. Must last 30 days. 120 tablet 0   morphine (MSIR) 15 MG tablet Take 1 tablet (15 mg total) by mouth every 6 (six) hours as needed for moderate pain or severe pain. Must last 30 days. 120 tablet 0   [START ON 01/19/2021] morphine (MSIR) 15 MG tablet Take 1 tablet (15 mg total) by mouth every 6 (six) hours as needed for moderate pain or severe pain. Must last 30 days. 120 tablet 0   Multiple Vitamin (MULTIVITAMIN WITH MINERALS) TABS tablet Take 1 tablet by mouth daily.     NONFORMULARY OR COMPOUNDED ITEM Apply 1-2 mLs topically 4 (four) times daily as needed. 10% Ketamine/2% Cyclobenzaprine/6% Gabapentin Cream 240 each PRN   nortriptyline (PAMELOR) 25 MG capsule Take 2 capsules (50 mg total) by mouth at bedtime. 180 capsule 1   Potassium Chloride ER 20 MEQ TBCR Take 1 tablet by mouth daily. 90 tablet 1   STIOLTO RESPIMAT 2.5-2.5 MCG/ACT AERS Inhale 2 puffs into the lungs 4 (four) times daily as needed. Prn per pulmonology notes     SUMAtriptan (IMITREX) 50 MG tablet Take 1 tablet (50 mg total) by mouth every 2 (two) hours as needed for migraine. Take 1 tab at onset of migraine. May repeat in 2 hours if headache persists or recurs. 10 tablet 12   No current facility-administered medications for this visit.   Facility-Administered Medications Ordered in Other Visits   Medication Dose Route Frequency Provider Last Rate Last Admin   0.9 %  sodium chloride infusion    Continuous PRN PeDionne BucyCRNA   New Bag at 01/22/19 0705   durvalumab (IMFINZI) 1,000 mg in sodium chloride 0.9 % 100 mL chemo infusion  10 mg/kg (Treatment Plan Recorded) Intravenous Once FiLloyd HugerMD  heparin lock flush 100 unit/mL  500 Units Intravenous Once [name], MD       heparin lock flush 100 unit/mL  500 Units Intracatheter Once PRN [name], MD       sodium chloride flush (NS) 0.9 % injection 10 mL  10 mL Intracatheter PRN [name], MD   10 mL at 03/01/20 0953    OBJECTIVE: Vitals:   01/03/21 0903  BP: 133/86  Pulse: 65  Resp: 16  Temp: (!) 96.1 F (35.6 C)  SpO2: 99%     Body mass index is 32.6 kg/m.    ECOG FS:0 - Asymptomatic  General: Well-developed, well-nourished, no acute distress. Eyes: Pink conjunctiva, anicteric sclera. HEENT: Normocephalic, moist mucous membranes. Lungs: No audible wheezing or coughing. Heart: Regular rate and rhythm. Abdomen: Soft, nontender, no obvious distention. Musculoskeletal: No edema, cyanosis, or clubbing. Neuro: Alert, answering all questions appropriately. Cranial nerves grossly intact. Skin: No rashes or petechiae noted. Psych: Normal affect.  LAB RESULTS:  Lab Results  Component Value Date   NA 137 01/03/2021   K 3.8 01/03/2021   CL 102 01/03/2021   CO2 28 01/03/2021   GLUCOSE 146 (H) 01/03/2021   BUN 12 01/03/2021   CREATININE 0.77 01/03/2021   CALCIUM 9.1 01/03/2021   PROT 7.9 01/03/2021   ALBUMIN 4.5 01/03/2021   AST 48 (H) 01/03/2021   ALT 59 (H) 01/03/2021   ALKPHOS 122 01/03/2021   BILITOT 0.7 01/03/2021   GFRNONAA >60 01/03/2021   GFRAA >60 12/01/2019    Lab Results  Component Value Date   WBC 6.1 01/03/2021   NEUTROABS 3.6 01/03/2021   HGB 13.9 01/03/2021   HCT 42.5 01/03/2021   MCV 90.4 01/03/2021   PLT 188 01/03/2021     STUDIES: CT  Chest W Contrast  Result Date: 12/16/2020 CLINICAL DATA:  60 year old male with history of metastatic squamous cell carcinoma of the left lung status post left lower lobectomy in November 2020, followed by chemotherapy and radiation therapy completed in 2021. Follow-up study. Intermittent right-sided chest discomfort for 1 week. EXAM: CT CHEST WITH CONTRAST TECHNIQUE: Multidetector CT imaging of the chest was performed during intravenous contrast administration. CONTRAST:  84m OMNIPAQUE IOHEXOL 350 MG/ML SOLN COMPARISON:  Chest CT 06/19/2020. FINDINGS: Cardiovascular: Heart size is normal. There is no significant pericardial fluid, thickening or pericardial calcification. There is aortic atherosclerosis, as well as atherosclerosis of the great vessels of the mediastinum and the coronary arteries, including calcified atherosclerotic plaque in the left anterior descending coronary artery. Left subclavian single-lumen porta cath with tip terminating in the proximal superior vena cava. Mediastinum/Nodes: No pathologically enlarged mediastinal or hilar lymph nodes. Esophagus is unremarkable in appearance. No axillary lymphadenopathy. Lungs/Pleura: Status post left lower lobectomy. Compensatory hyperexpansion of the left upper lobe. In the central aspect of the left upper lobe there are some regions with ground-glass attenuation, thickening of the peribronchovascular interstitium and regional architectural distortion, similar to the prior study, most compatible with chronic postradiation changes. 4 mm subpleural nodule in the posterior aspect of the right upper lobe (axial image 39 of series 3) stable on numerous prior examinations dating back to at least 08/13/2019, considered benign. No other more suspicious appearing pulmonary nodules or masses are noted. No acute consolidative airspace disease. No pleural effusions. Upper Abdomen: Diffuse low attenuation throughout the visualized hepatic parenchyma, indicative of  a background of hepatic steatosis. Musculoskeletal: There are no aggressive appearing lytic or blastic lesions noted in the visualized portions of  the skeleton. IMPRESSION: 1. Status post left lower lobectomy with some mild chronic areas of postradiation fibrosis in the central perihilar aspect of the left upper lobe, similar to the prior study. No definite findings to suggest metastatic disease in the thorax. 2. Stable 4 mm right upper lobe pulmonary nodule, considered benign. 3. Aortic atherosclerosis, in addition to left anterior descending coronary artery disease. Please note that although the presence of coronary artery calcium documents the presence of coronary artery disease, the severity of this disease and any potential stenosis cannot be assessed on this non-gated CT examination. Assessment for potential risk factor modification, dietary therapy or pharmacologic therapy may be warranted, if clinically indicated. 4. Hepatic steatosis. Aortic Atherosclerosis (ICD10-I70.0). Electronically Signed   By: Vinnie Langton M.D.   On: 12/16/2020 08:24    ASSESSMENT: Clinical stage IIIa squamous cell 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  PLAN:    1.  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  He had a complicated postoperative course. CT scan results from August 14, 2019 reviewed independently with a suspicious subcarinal lymph node measuring 1.4 cm.  Follow-up PET scan on August 23, 2019 revealed hypermetabolism in lymph node highly suspicious for recurrence.  Biopsy confirmed recurrence increasing patient's stage from IA up to IIIA. Patient completed concurrent XRT and weekly carboplatinum and Taxol on January 05, 2020.  Patient initiated 1 year of maintenance immunotherapy using durvalumab on January 19, 2020.  Restaging CT scan on December 14, 2020 reviewed independently and reported as above with no obvious evidence of  recurrent or progressive disease.  Proceed with cycle 22 of treatment today.  Return to clinic in 2 weeks for patient's 23rd and final maintenance treatment.  Patient will then return to clinic in 3 months for repeat laboratory work and further evaluation.  We will repeat imaging in 6 months.  2.  Pain: Patient does not complain of this today.  Unrelated to his malignancy.  Continue monitoring and treatment per pain clinic. 3.  Nausea: Resolved.  Previously, patient asked if it were okay to take CBD Gummies for nausea.  From an oncology standpoint this would be okay, but patient did state concern that it would show a positive urine test.  He was instructed to let pain clinic know that he was taking this for oncologic purposes.  I spent a total of 30 minutes reviewing chart data, face-to-face evaluation with the patient, counseling and coordination of care as detailed above.   Patient expressed understanding and was in agreement with this plan. He also understands that He can call clinic at any time with any questions, concerns, or complaints.   Cancer Staging Squamous cell carcinoma of left lung [facility]) Staging form: Lung, AJCC 8th Edition - Clinical stage from 02/12/2019: Stage IIIA (cT1b, cN2, cM0) - Signed by [name], MD on 11/01/2019   [name], MD   01/03/2021 9:45 AM

## 2020-12-31 IMAGING — CR DG CHEST 2V
2 series · 2 of 2 positions shown · non-contrast
Comparison: Chest radiograph October 15, 2019; chest CT October 28, 2019

CLINICAL DATA: Lung carcinoma. Hypertension. Preoperative
Port-A-Cath placement

EXAM:
CHEST - 2 VIEW

[chest pa]
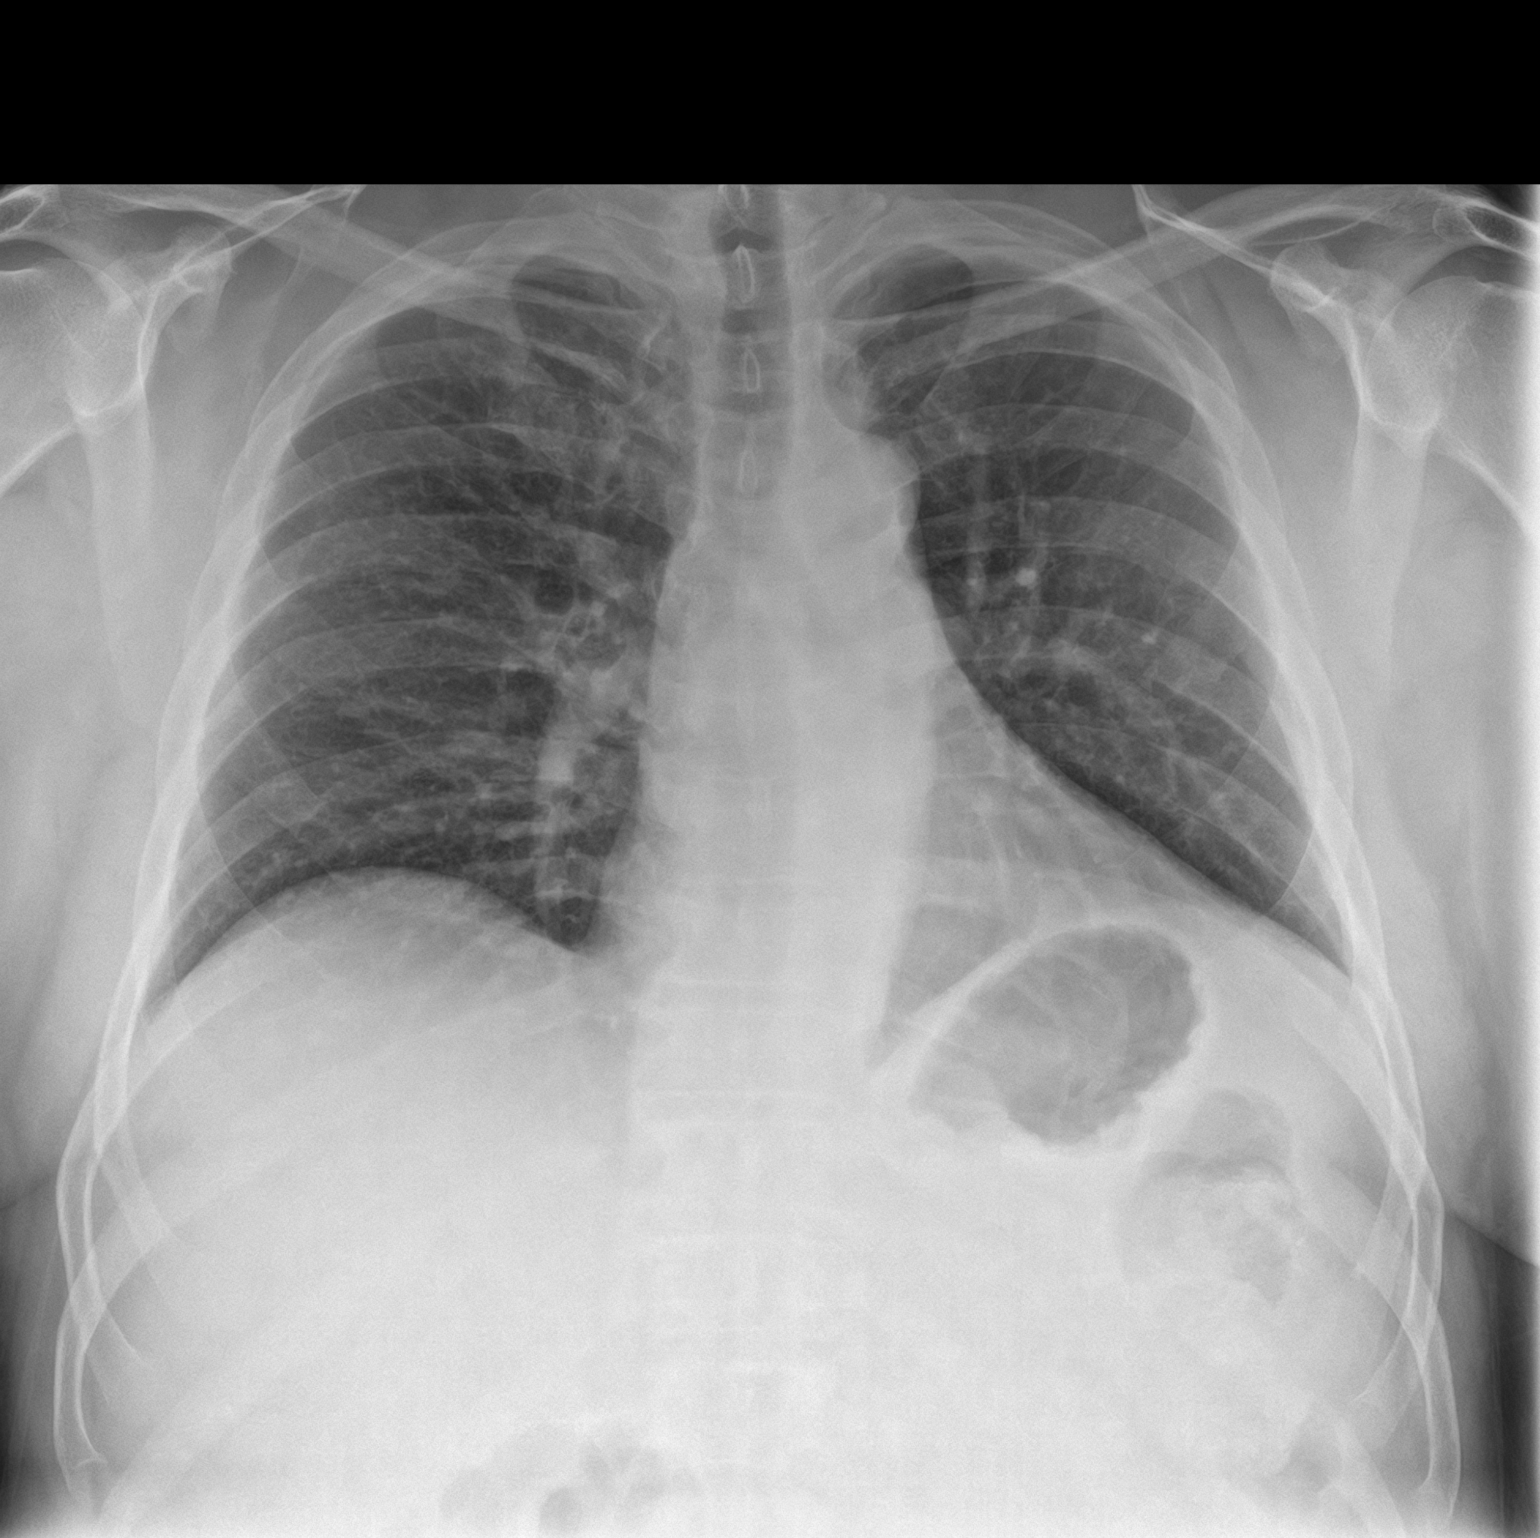

[chest lat]
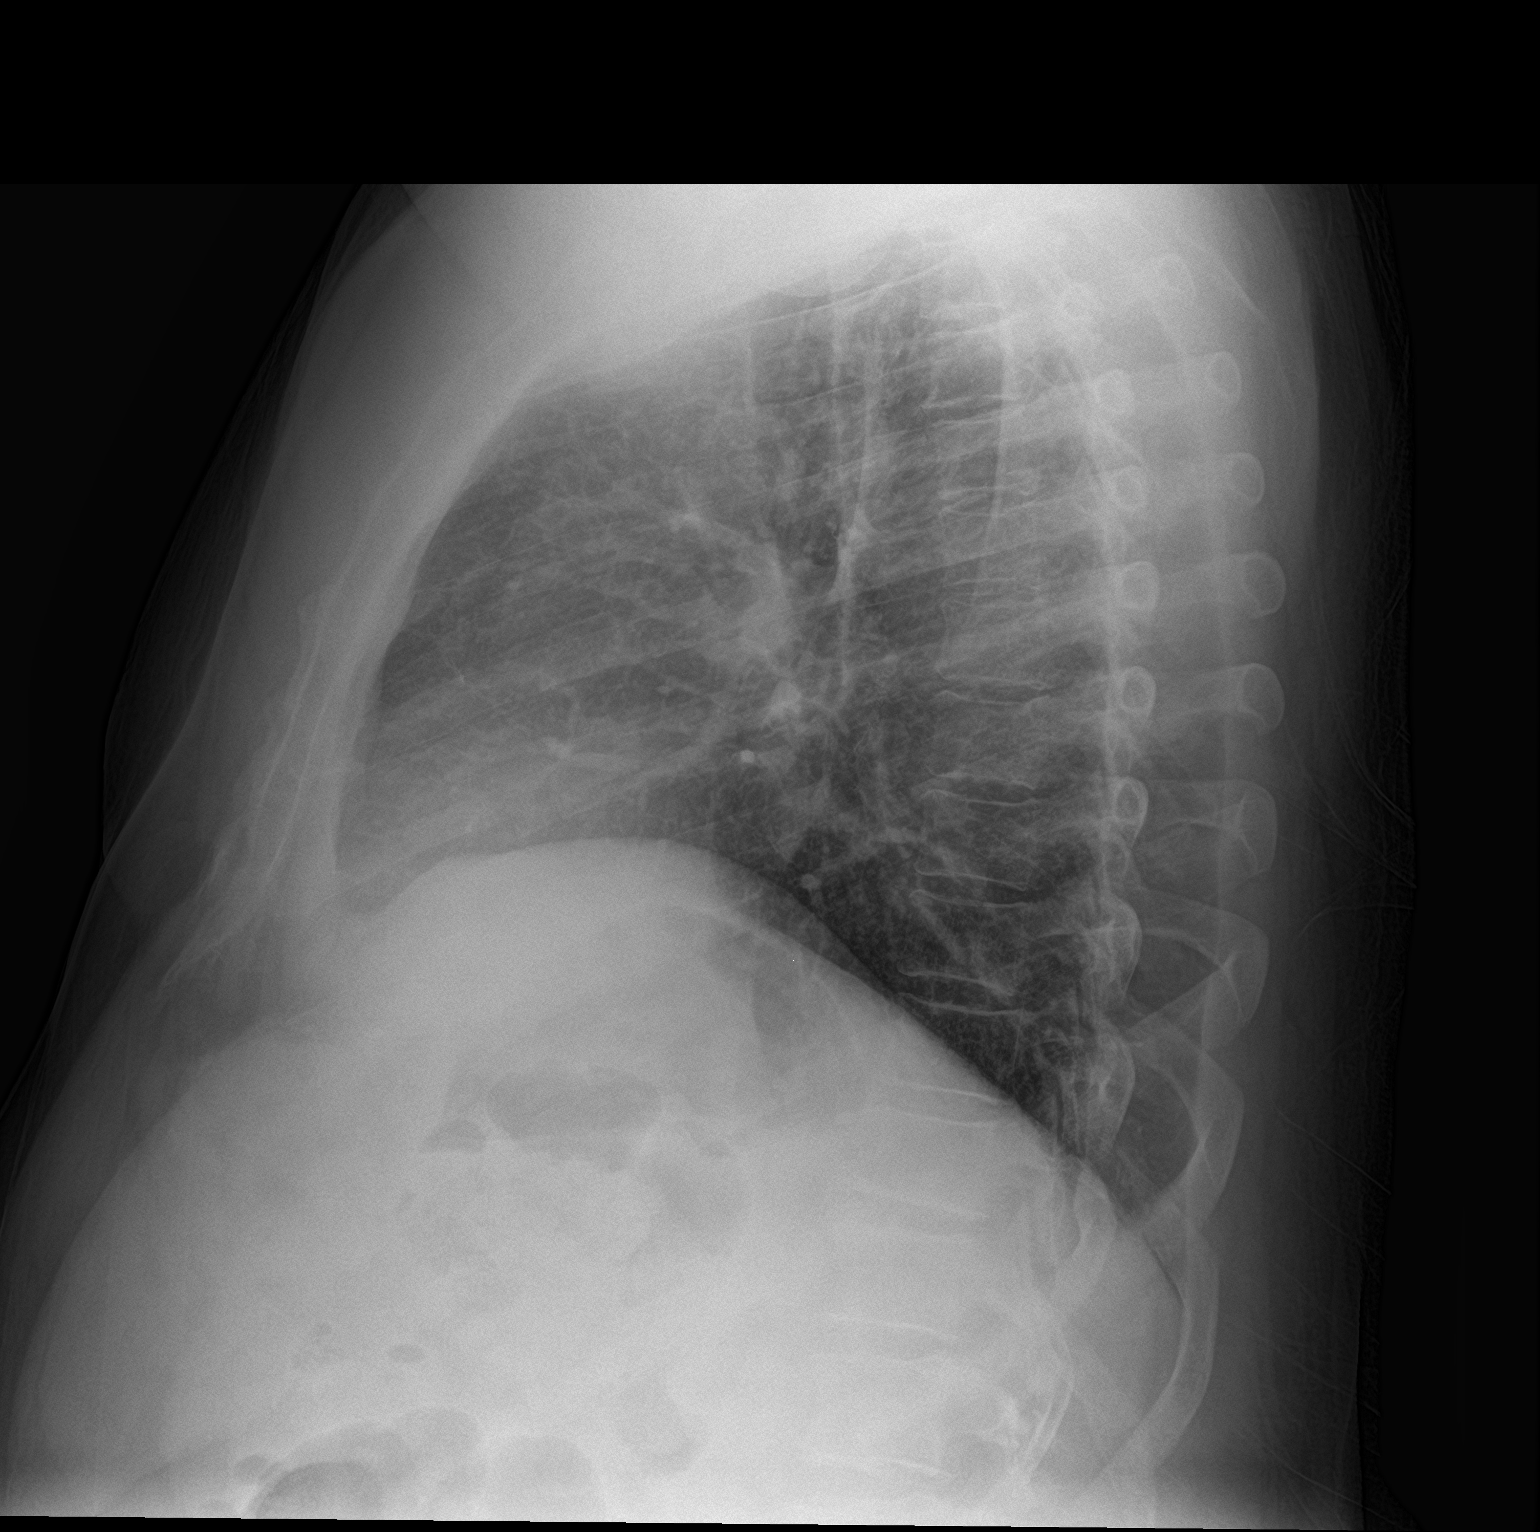

[2 of 2 positions shown; findings below may reference images not displayed]

FINDINGS: No edema or airspace opacity. Heart size and pulmonary vascular
normal. Adenopathy noted on recent CT examination is not appreciable
by radiography. No evident bone lesions.
IMPRESSION: No edema or airspace opacity. Heart size within normal limits.
Adenopathy seen on recent CT not appreciable by radiography.

## 2021-01-02 ENCOUNTER — Other Ambulatory Visit: Payer: Self-pay | Admitting: Family Medicine

## 2021-01-02 NOTE — Telephone Encounter (Signed)
Rx filled 12/23/20- request for future fills- too soon Requested Prescriptions  Pending Prescriptions Disp Refills  . metoprolol succinate (TOPROL-XL) 100 MG 24 hr tablet [Pharmacy Med Name: METOPROLOL ER SUCCINATE 100MG  TABS] 180 tablet 1    Sig: TAKE 1 TABLET BY MOUTH TWICE DAILY. TAKE WITH OR IMMEDIATELY FOLLOWING A MEAL. TO BE TAKEN WITH THE 50 MG METOPROLOL TARTRATE     Cardiovascular:  Beta Blockers Passed - 01/02/2021  8:04 AM      Passed - Last BP in normal range    BP Readings from Last 1 Encounters:  12/21/20 136/88         Passed - Last Heart Rate in normal range    Pulse Readings from Last 1 Encounters:  12/21/20 75         Passed - Valid encounter within last 6 months    Recent Outpatient Visits          4 months ago Essential hypertension   [place], [name], DO   7 months ago Type 2 diabetes mellitus with stage 1 chronic kidney disease, without long-term current use of insulin ([place])   [place], [name], DO   7 months ago Viral upper respiratory tract infection   [facility] Jon Billings, NP   10 months ago Type 2 diabetes mellitus with stage 1 chronic kidney disease, without long-term current use of insulin ([place])   [place], [name], DO   1 year ago Type 2 diabetes mellitus with stage 1 chronic kidney disease, without long-term current use of insulin ([place])   [place], [place], DO      Future Appointments            In 2 months Johnson, [name], DO [place], [name]   In 7 months  [facility], [name]           . hydrALAZINE (APRESOLINE) 100 MG tablet [Pharmacy Med Name: HYDRALAZINE 100MG  (HUNDRED MG) TABS] 180 tablet 1    Sig: TAKE 1 TABLET(100 MG) BY MOUTH TWICE DAILY     Cardiovascular:  Vasodilators Passed - 01/02/2021  8:04 AM      Passed - HCT in normal range and within 360 days    HCT  Date Value Ref Range Status   12/20/2020 40.7 39.0 - 52.0 % Final   Hematocrit  Date Value Ref Range Status  04/27/2019 39.9 37.5 - 51.0 % Final         Passed - HGB in normal range and within 360 days    Hemoglobin  Date Value Ref Range Status  12/20/2020 13.5 13.0 - 17.0 g/dL Final  04/27/2019 13.3 13.0 - 17.7 g/dL Final         Passed - RBC in normal range and within 360 days    RBC  Date Value Ref Range Status  12/20/2020 4.55 4.22 - 5.81 MIL/uL Final         Passed - WBC in normal range and within 360 days    WBC  Date Value Ref Range Status  12/20/2020 6.4 4.0 - 10.5 K/uL Final         Passed - PLT in normal range and within 360 days    Platelets  Date Value Ref Range Status  12/20/2020 168 150 - 400 K/uL Final  04/27/2019 211 150 - 450 x10E3/uL Final         Passed - Last BP  in normal range    BP Readings from Last 1 Encounters:  12/21/20 136/88         Passed - Valid encounter within last 12 months    Recent Outpatient Visits          4 months ago Essential hypertension   [place], [name], DO   7 months ago Type 2 diabetes mellitus with stage 1 chronic kidney disease, without long-term current use of insulin ([place])   [place], [name], DO   7 months ago Viral upper respiratory tract infection   [facility] Jon Billings, NP   10 months ago Type 2 diabetes mellitus with stage 1 chronic kidney disease, without long-term current use of insulin ([place])   [place], [name], DO   1 year ago Type 2 diabetes mellitus with stage 1 chronic kidney disease, without long-term current use of insulin ([place])   [place], [place], DO      Future Appointments            In 2 months Johnson, Barb Merino, DO [place], [place]   In 7 months  [facility], [name]

## 2021-01-02 NOTE — Telephone Encounter (Signed)
Just an fyi.  Please advise as needed.

## 2021-01-03 ENCOUNTER — Inpatient Hospital Stay: Payer: Medicare Other | Attending: Oncology

## 2021-01-03 ENCOUNTER — Other Ambulatory Visit: Payer: Self-pay

## 2021-01-03 ENCOUNTER — Inpatient Hospital Stay (HOSPITAL_BASED_OUTPATIENT_CLINIC_OR_DEPARTMENT_OTHER): Payer: Medicare Other | Admitting: Oncology

## 2021-01-03 ENCOUNTER — Inpatient Hospital Stay: Payer: Medicare Other

## 2021-01-03 ENCOUNTER — Encounter: Payer: Self-pay | Admitting: Oncology

## 2021-01-03 VITALS — BP 133/86 | HR 65 | Temp 96.1°F | Resp 16 | Wt 227.2 lb

## 2021-01-03 DIAGNOSIS — I700 Atherosclerosis of aorta: Secondary | ICD-10-CM | POA: Insufficient documentation

## 2021-01-03 DIAGNOSIS — K760 Fatty (change of) liver, not elsewhere classified: Secondary | ICD-10-CM | POA: Diagnosis not present

## 2021-01-03 DIAGNOSIS — F17210 Nicotine dependence, cigarettes, uncomplicated: Secondary | ICD-10-CM | POA: Insufficient documentation

## 2021-01-03 DIAGNOSIS — Z95828 Presence of other vascular implants and grafts: Secondary | ICD-10-CM

## 2021-01-03 DIAGNOSIS — C3432 Malignant neoplasm of lower lobe, left bronchus or lung: Secondary | ICD-10-CM | POA: Insufficient documentation

## 2021-01-03 DIAGNOSIS — Z809 Family history of malignant neoplasm, unspecified: Secondary | ICD-10-CM | POA: Insufficient documentation

## 2021-01-03 DIAGNOSIS — C3492 Malignant neoplasm of unspecified part of left bronchus or lung: Secondary | ICD-10-CM

## 2021-01-03 DIAGNOSIS — Z79899 Other long term (current) drug therapy: Secondary | ICD-10-CM | POA: Insufficient documentation

## 2021-01-03 DIAGNOSIS — E119 Type 2 diabetes mellitus without complications: Secondary | ICD-10-CM | POA: Insufficient documentation

## 2021-01-03 DIAGNOSIS — Z5112 Encounter for antineoplastic immunotherapy: Secondary | ICD-10-CM | POA: Diagnosis not present

## 2021-01-03 DIAGNOSIS — Z833 Family history of diabetes mellitus: Secondary | ICD-10-CM | POA: Insufficient documentation

## 2021-01-03 DIAGNOSIS — Z8249 Family history of ischemic heart disease and other diseases of the circulatory system: Secondary | ICD-10-CM | POA: Insufficient documentation

## 2021-01-03 LAB — CBC WITH DIFFERENTIAL/PLATELET
Abs Immature Granulocytes: 0.04 10*3/uL (ref 0.00–0.07)
Basophils Absolute: 0.1 10*3/uL (ref 0.0–0.1)
Basophils Relative: 1 %
Eosinophils Absolute: 0.1 10*3/uL (ref 0.0–0.5)
Eosinophils Relative: 2 %
HCT: 42.5 % (ref 39.0–52.0)
Hemoglobin: 13.9 g/dL (ref 13.0–17.0)
Immature Granulocytes: 1 %
Lymphocytes Relative: 29 %
Lymphs Abs: 1.8 10*3/uL (ref 0.7–4.0)
MCH: 29.6 pg (ref 26.0–34.0)
MCHC: 32.7 g/dL (ref 30.0–36.0)
MCV: 90.4 fL (ref 80.0–100.0)
Monocytes Absolute: 0.5 10*3/uL (ref 0.1–1.0)
Monocytes Relative: 8 %
Neutro Abs: 3.6 10*3/uL (ref 1.7–7.7)
Neutrophils Relative %: 59 %
Platelets: 188 10*3/uL (ref 150–400)
RBC: 4.7 MIL/uL (ref 4.22–5.81)
RDW: 13.4 % (ref 11.5–15.5)
WBC: 6.1 10*3/uL (ref 4.0–10.5)
nRBC: 0 % (ref 0.0–0.2)

## 2021-01-03 LAB — COMPREHENSIVE METABOLIC PANEL
ALT: 59 U/L — ABNORMAL HIGH (ref 0–44)
AST: 48 U/L — ABNORMAL HIGH (ref 15–41)
Albumin: 4.5 g/dL (ref 3.5–5.0)
Alkaline Phosphatase: 122 U/L (ref 38–126)
Anion gap: 7 (ref 5–15)
BUN: 12 mg/dL (ref 6–20)
CO2: 28 mmol/L (ref 22–32)
Calcium: 9.1 mg/dL (ref 8.9–10.3)
Chloride: 102 mmol/L (ref 98–111)
Creatinine, Ser: 0.77 mg/dL (ref 0.61–1.24)
GFR, Estimated: >60 mL/min (ref >60)
Glucose, Bld: 146 mg/dL — ABNORMAL HIGH (ref 70–99)
Potassium: 3.8 mmol/L (ref 3.5–5.1)
Sodium: 137 mmol/L (ref 135–145)
Total Bilirubin: 0.7 mg/dL (ref 0.3–1.2)
Total Protein: 7.9 g/dL (ref 6.5–8.1)

## 2021-01-03 MED ORDER — SODIUM CHLORIDE 0.9% FLUSH
10.0000 mL | Freq: Once | INTRAVENOUS | Status: AC
Start: 2021-01-03 — End: 2021-01-03
  Administered 2021-01-03: 10 mL via INTRAVENOUS
  Filled 2021-01-03: qty 10

## 2021-01-03 MED ORDER — HEPARIN SOD (PORK) LOCK FLUSH 100 UNIT/ML IV SOLN
500.0000 [IU] | Freq: Once | INTRAVENOUS | Status: AC
  Administered 2021-01-03: 500 [IU] via INTRAVENOUS
  Filled 2021-01-03: qty 5

## 2021-01-03 MED ORDER — SODIUM CHLORIDE 0.9 % IV SOLN
10.0000 mg/kg | Freq: Once | INTRAVENOUS | Status: AC
  Administered 2021-01-03: 1000 mg via INTRAVENOUS
  Filled 2021-01-03: qty 20

## 2021-01-03 MED ORDER — SODIUM CHLORIDE 0.9 % IV SOLN
Freq: Once | INTRAVENOUS | Status: AC
  Filled 2021-01-03: qty 250

## 2021-01-03 MED ORDER — HEPARIN SOD (PORK) LOCK FLUSH 100 UNIT/ML IV SOLN
500.0000 [IU] | Freq: Once | INTRAVENOUS | Status: DC | PRN
  Filled 2021-01-03: qty 5

## 2021-01-03 NOTE — Progress Notes (Signed)
Pt c/o "growth" in the middle of his back that is tender to the touch. No other concerns at this time.

## 2021-01-03 NOTE — Patient Instructions (Signed)
[facility]  Discharge Instructions: Thank you for choosing [facility] to provide your oncology and hematology care.  If you have a lab appointment with the [facility], please go directly to the [facility] and check in at the registration area.  Wear comfortable clothing and clothing appropriate for easy access to any Portacath or PICC line.   We strive to give you quality time with your provider. You may need to reschedule your appointment if you arrive late (15 or more minutes).  Arriving late affects you and other patients whose appointments are after yours.  Also, if you miss three or more appointments without notifying the office, you may be dismissed from the clinic at the provider's discretion.      For prescription refill requests, have your pharmacy contact our office and allow 72 hours for refills to be completed.    Today you received the following chemotherapy and/or immunotherapy agents: Durvalumab       To help prevent nausea and vomiting after your treatment, we encourage you to take your nausea medication as directed.  BELOW ARE SYMPTOMS THAT SHOULD BE REPORTED IMMEDIATELY: *FEVER GREATER THAN 100.4 F (38 C) OR HIGHER *CHILLS OR SWEATING *NAUSEA AND VOMITING THAT IS NOT CONTROLLED WITH YOUR NAUSEA MEDICATION *UNUSUAL SHORTNESS OF BREATH *UNUSUAL BRUISING OR BLEEDING *URINARY PROBLEMS (pain or burning when urinating, or frequent urination) *BOWEL PROBLEMS (unusual diarrhea, constipation, pain near the anus) TENDERNESS IN MOUTH AND THROAT WITH OR WITHOUT PRESENCE OF ULCERS (sore throat, sores in mouth, or a toothache) UNUSUAL RASH, SWELLING OR PAIN  UNUSUAL VAGINAL DISCHARGE OR ITCHING   Items with * indicate a potential emergency and should be followed up as soon as possible or go to the Emergency Department if any problems should occur.  Please show the CHEMOTHERAPY ALERT CARD or IMMUNOTHERAPY ALERT CARD at  check-in to the Emergency Department and triage nurse.  Should you have questions after your visit or need to cancel or reschedule your appointment, please contact [facility]  [phone] and follow the prompts.  Office hours are 8:00 a.m. to 4:30 p.m. Monday - Friday. Please note that voicemails left after 4:00 p.m. may not be returned until the following business day.  We are closed weekends and major holidays. You have access to a nurse at all times for urgent questions. Please call the main number to the clinic [phone] and follow the prompts.  For any non-urgent questions, you may also contact your provider using MyChart. We now offer e-Visits for anyone 22 and older to request care online for non-urgent symptoms. For details visit [email or web address].   Also download the MyChart app! Go to the app store, search "MyChart", open the app, select [facility], and log in with your MyChart username and password.  Due to Covid, a mask is required upon entering the hospital/clinic. If you do not have a mask, one will be given to you upon arrival. For doctor visits, patients may have 1 support person aged 67 or older with them. For treatment visits, patients cannot have anyone with them due to current Covid guidelines and our immunocompromised population. Durvalumab injection What is this medication? DURVALUMAB (dur VAL ue mab) is a monoclonal antibody. It is used to treat lung cancer. This medicine may be used for other purposes; ask your health care provider or pharmacist if you have questions. COMMON BRAND NAME(S): [email or web address] What should I tell my care team before I take this  medication? They need to know if you have any of these conditions: autoimmune diseases like Crohn's disease, ulcerative colitis, or lupus have had or planning to have an allogeneic stem cell transplant (uses someone else's stem cells) history of organ transplant history of radiation  to the chest nervous system problems like myasthenia gravis or Guillain-Barre syndrome an unusual or allergic reaction to durvalumab, other medicines, foods, dyes, or preservatives pregnant or trying to get pregnant breast-feeding How should I use this medication? This medicine is for infusion into a vein. It is given by a health care professional in a hospital or clinic setting. A special MedGuide will be given to you before each treatment. Be sure to read this information carefully each time. Talk to your pediatrician regarding the use of this medicine in children. Special care may be needed. Overdosage: If you think you have taken too much of this medicine contact a poison control center or emergency room at once. NOTE: This medicine is only for you. Do not share this medicine with others. What if I miss a dose? It is important not to miss your dose. Call your doctor or health care professional if you are unable to keep an appointment. What may interact with this medication? Interactions have not been studied. This list may not describe all possible interactions. Give your health care provider a list of all the medicines, herbs, non-prescription drugs, or dietary supplements you use. Also tell them if you smoke, drink alcohol, or use illegal drugs. Some items may interact with your medicine. What should I watch for while using this medication? This drug may make you feel generally unwell. Continue your course of treatment even though you feel ill unless your doctor tells you to stop. You may need blood work done while you are taking this medicine. Do not become pregnant while taking this medicine or for 3 months after stopping it. Women should inform their doctor if they wish to become pregnant or think they might be pregnant. There is a potential for serious side effects to an unborn child. Talk to your health care professional or pharmacist for more information. Do not breast-feed an infant  while taking this medicine or for 3 months after stopping it. What side effects may I notice from receiving this medication? Side effects that you should report to your doctor or health care professional as soon as possible: allergic reactions like skin rash, itching or hives, swelling of the face, lips, or tongue black, tarry stools bloody or watery diarrhea breathing problems change in emotions or moods change in sex drive changes in vision chest pain or chest tightness chills confusion cough facial flushing fever headache signs and symptoms of high blood sugar such as dizziness; dry mouth; dry skin; fruity breath; nausea; stomach pain; increased hunger or thirst; increased urination signs and symptoms of liver injury like dark yellow or brown urine; general ill feeling or flu-like symptoms; light-colored stools; loss of appetite; nausea; right upper belly pain; unusually weak or tired; yellowing of the eyes or skin stomach pain trouble passing urine or change in the amount of urine weight gain or weight loss Side effects that usually do not require medical attention (report these to your doctor or health care professional if they continue or are bothersome): bone pain constipation loss of appetite muscle pain nausea swelling of the ankles, feet, hands tiredness This list may not describe all possible side effects. Call your doctor for medical advice about side effects. You may report side effects  to FDA at 1-800-FDA-1088. Where should I keep my medication? This drug is given in a hospital or clinic and will not be stored at home. NOTE: This sheet is a summary. It may not cover all possible information. If you have questions about this medicine, talk to your doctor, pharmacist, or health care provider.  2022 Elsevier/Gold Standard (2019-04-29 13:01:29)

## 2021-01-12 ENCOUNTER — Ambulatory Visit (INDEPENDENT_AMBULATORY_CARE_PROVIDER_SITE_OTHER): Payer: Medicare Other

## 2021-01-12 ENCOUNTER — Telehealth: Payer: Medicare Other | Admitting: General Practice

## 2021-01-12 DIAGNOSIS — L989 Disorder of the skin and subcutaneous tissue, unspecified: Secondary | ICD-10-CM

## 2021-01-12 DIAGNOSIS — E1122 Type 2 diabetes mellitus with diabetic chronic kidney disease: Secondary | ICD-10-CM

## 2021-01-12 DIAGNOSIS — N181 Chronic kidney disease, stage 1: Secondary | ICD-10-CM

## 2021-01-12 DIAGNOSIS — C3490 Malignant neoplasm of unspecified part of unspecified bronchus or lung: Secondary | ICD-10-CM

## 2021-01-12 DIAGNOSIS — I10 Essential (primary) hypertension: Secondary | ICD-10-CM

## 2021-01-12 DIAGNOSIS — R918 Other nonspecific abnormal finding of lung field: Secondary | ICD-10-CM

## 2021-01-12 NOTE — Patient Instructions (Signed)
Visit Information  Care Plan : RNCM: General Plan of Care (Adult) for Chronic Disease Management and Care Coordination Needs  Updates made by Vanita Ingles, RN since 01/12/2021 12:00 AM     Problem: RNCM: Development of Plan of Care for Chronic Disease Management (DM, HTN, Lung Cancer)   Priority: High     Long-Range Goal: RNCM: Effective Management  of Plan of Care for Chronic Disease Management (DM, HTN, Lung Cancer)   Start Date: 01/12/2021  Expected End Date: 01/12/2022  Priority: High  Note:   Current Barriers:  Knowledge Deficits related to plan of care for management of HTN, DMII, and Lung cancer  Chronic Disease Management support and education needs related to HTN, DMII, and Lung cancer   RNCM Clinical Goal(s):  Patient will verbalize understanding of plan for management of HTN, DMII, and lung cancer  as evidenced by keeping appointments, following the plan of care, working with the CCM team to effectively manage health and well being  demonstrate understanding of rationale for each prescribed medication as evidenced by compliance with medications and calling for refills before running out of medications     attend all scheduled medical appointments: 03-06-2021 at 0840 am as evidenced by keeping appointments and calling the office if appointment needs to be reschduled         demonstrate improved and ongoing adherence to prescribed treatment plan for HTN, DMII, and lung cancer  as evidenced by effective management of chronic conditions by working with the CCM team to optimize health and well being  demonstrate a decrease in HTN, DMII, and Lung cancer  exacerbations  as evidenced by effective management of chronic conditions  demonstrate ongoing self health care management ability for effective management of chronic conditions  as evidenced by working with the CCM team through collaboration with Consulting civil engineer, provider, and care team.   Interventions: 1:1 collaboration with  primary care provider regarding development and update of comprehensive plan of care as evidenced by provider attestation and co-signature Inter-disciplinary care team collaboration (see longitudinal plan of care) Evaluation of current treatment plan related to  self management and patient's adherence to plan as established by provider   SDOH Barriers (Status: Goal on Track (progressing): YES.) Long Term Goal  Patient interviewed and SDOH assessment performed        Patient interviewed and appropriate assessments performed Provided patient with information about resources available and care guides to assist with new needs or changes in SDOH Discussed plans with patient for ongoing care management follow up and provided patient with direct contact information for care management team Advised patient to call the office for changes in [place], questions or concerns    Diabetes:  (Status: Goal on Track (progressing): YES.) Long Term Goal   Lab Results  Component Value Date   HGBA1C 6.1 09/01/2020  Assessed patient's understanding of A1c goal: <7% Provided education to patient about basic DM disease process; Reviewed medications with patient and discussed importance of medication adherence;        Reviewed prescribed diet with patient heart healthy/ADA diet ; Counseled on importance of regular laboratory monitoring as prescribed;        Discussed plans with patient for ongoing care management follow up and provided patient with direct contact information for care management team;      Provided patient with written educational materials related to hypo and hyperglycemia and importance of correct treatment;       Reviewed scheduled/upcoming provider appointments including: 03-06-2021  at [place] am;         Advised patient, providing education and rationale, to check cbg as directed  and record        call provider for findings outside established parameters;       Review of patient status, including  review of consultants reports, relevant laboratory and other test results, and medications completed;       Screening for signs and symptoms of depression related to chronic disease state;        Assessed social determinant of health barriers;         Hypertension: (Status: Goal on Track (progressing): YES.) Last practice recorded BP readings:  BP Readings from Last 3 Encounters:  01/03/21 133/86  12/21/20 136/88  12/20/20 (!) 132/91  Most recent eGFR/CrCl:  Lab Results  Component Value Date   EGFR 99 09/01/2020    No components found for: CRCL  Evaluation of current treatment plan related to hypertension self management and patient's adherence to plan as established by provider;   Provided education to patient re: stroke prevention, s/s of heart attack and stroke; Reviewed prescribed diet heart healthy/ADA Reviewed medications with patient and discussed importance of compliance;  Discussed plans with patient for ongoing care management follow up and provided patient with direct contact information for care management team; Advised patient, providing education and rationale, to monitor blood pressure daily and record, calling PCP for findings outside established parameters;  Provided education on prescribed diet heart healthy/ADA;  Discussed complications of poorly controlled blood pressure such as heart disease, stroke, circulatory complications, vision complications, kidney impairment, sexual dysfunction;    Lung cancer   (Status: Goal on Track (progressing): YES.) Long Term Goal  Evaluation of current treatment plan related to  Lung cancer  ,  self-management and patient's adherence to plan as established by provider. Discussed plans with patient for ongoing care management follow up and provided patient with direct contact information for care management team Advised patient to keep appointments, calling the office for changes in conditions, or sx and sx of adverse reactions to  chemotherapy treatment for lung cancer ; Provided education to patient re: sx and sx to monitor for changes, reporting increased fatigue, nausea, or pain, maintaining nutrition and utilization of support system ; Reviewed medications with patient and discussed compliance ; Reviewed scheduled/upcoming provider appointments including 03-06-2021 at 0840 am; Discussed plans with patient for ongoing care management follow up and provided patient with direct contact information for care management team; Advised patient to discuss changes in disease progression with provider;    Skin tag/skin lesion to chest and back   (Status: New goal.) Short Term Goal  Evaluation of current treatment plan related to  acute onset of skin tag or lesion to back and chest ,  self-management and patient's adherence to plan as established by provider. Discussed plans with patient for ongoing care management follow up and provided patient with direct contact information for care management team Advised patient to call the office and secure an appointment for follow up on skin tag or lession to mid back and chest. The patient states it is an area that is sore and the cancer center states that it is something the pcp needs to evaluate; Provided education to patient re: sx and sx of infection, calling the office for changes, and monitoring for changes in the 2 areas of concern; Collaborated with pcp regarding new onset of skin tags/lesion to back and chest; Reviewed scheduled/upcoming provider appointments including 03-06-2021,  advised the patient he may need a sooner appointment to call and set up and appointment to see the pcp; Discussed plans with patient for ongoing care management follow up and provided patient with direct contact information for care management team;   Patient Goals/Self-Care Activities: Patient will self administer medications as prescribed as evidenced by self report/primary caregiver report  Patient will  attend all scheduled provider appointments as evidenced by clinician review of documented attendance to scheduled appointments and patient/caregiver report Patient will call pharmacy for medication refills as evidenced by patient report and review of pharmacy fill history as appropriate Patient will attend church or other social activities as evidenced by patient report Patient will continue to perform ADL's independently as evidenced by patient/caregiver report Patient will continue to perform IADL's independently as evidenced by patient/caregiver report Patient will call provider office for new concerns or questions as evidenced by review of documented incoming telephone call notes and patient report Patient will work with BSW to address care coordination needs and will continue to work with the clinical team to address health care and disease management related needs as evidenced by documented adherence to scheduled care management/care coordination appointments schedule appointment with eye doctor check blood sugar at prescribed times: when you have symptoms of low or high blood sugar, before and after exercise, and as directed   check feet daily for cuts, sores or redness enter blood sugar readings and medication or insulin into daily log take the blood sugar log to all doctor visits trim toenails straight across drink 6 to 8 glasses of water each day eat fish at least once per week fill half of plate with vegetables limit fast food meals to no more than 1 per week manage portion size prepare main meal at home 3 to 5 days each week read food labels for fat, fiber, carbohydrates and portion size reduce red meat to 2 to 3 times a week set a realistic goal keep feet up while sitting wash and dry feet carefully every day wear comfortable, cotton socks wear comfortable, well-fitting shoes - check blood pressure weekly - choose a place to take my blood pressure (home, clinic or office,  retail store) - learn about high blood pressure - keep a blood pressure log - take blood pressure log to all doctor appointments - call doctor for signs and symptoms of high blood pressure - develop an action plan for high blood pressure - keep all doctor appointments - take medications for blood pressure exactly as prescribed - report new symptoms to your doctor - eat more whole grains, fruits and vegetables, lean meats and healthy fats       Patient verbalizes understanding of instructions provided today and agrees to view in [place].   Telephone follow up appointment with care management team member scheduled for: 03-16-2020 at 230 pm  Noreene Larsson RN, MSN, [facility] Mobile: [phone]

## 2021-01-12 NOTE — Chronic Care Management (AMB) (Signed)
Chronic Care Management   CCM RN Visit Note  01/12/2021 Name: Kristopher [name] MRN: [id number] DOB: 1961-01-20  Subjective: Kristopher [name] is a 60 y.o. year old male who is a primary care patient of [name], DO. The care management team was consulted for assistance with disease management and care coordination needs.    Engaged with patient by telephone for follow up visit in response to provider referral for case management and/or care coordination services.   Consent to Services:  The patient was given information about Chronic Care Management services, agreed to services, and gave verbal consent prior to initiation of services.  Please see initial visit note for detailed documentation.   Patient agreed to services and verbal consent obtained.   Assessment: Review of patient past medical history, allergies, medications, health status, including review of consultants reports, laboratory and other test data, was performed as part of comprehensive evaluation and provision of chronic care management services.   SDOH (Social Determinants of Health) assessments and interventions performed:    CCM Care Plan  Allergies  Allergen Reactions   Acetaminophen Swelling   Aspirin Anaphylaxis   Epinephrine Anaphylaxis    Does not include albuterol   Novocain [Procaine] Anaphylaxis   Penicillins Anaphylaxis    Has patient had a PCN reaction causing immediate rash, facial/tongue/throat swelling, SOB or lightheadedness with hypotension: Yes Has patient had a PCN reaction causing severe rash involving mucus membranes or skin necrosis: No Has patient had a PCN reaction that required hospitalization: Yes Has patient had a PCN reaction occurring within the last 10 years: No If all of the above answers are "NO", then may proceed with Cephalosporin use.    Strawberry Extract Anaphylaxis   Shellfish Allergy Hives and Nausea And Vomiting    Outpatient Encounter Medications as of  01/12/2021  Medication Sig Note   Accu-Chek FastClix Lancets MISC USE TO TEST BLOOD SUGAR 2X A DAY    amLODipine (NORVASC) 5 MG tablet TAKE 1 TABLET(5 MG) BY MOUTH TWICE DAILY    atorvastatin (LIPITOR) 10 MG tablet Take 1 tablet (10 mg total) by mouth daily at 6 PM.    BREZTRI AEROSPHERE 160-9-4.8 MCG/ACT AERO Inhale 2 puffs into the lungs 2 (two) times daily. Maintenance per pulmonology    diphenhydrAMINE (BENADRYL) 25 MG tablet Take 50 mg by mouth daily.    esomeprazole (NEXIUM) 40 MG capsule Take 1 capsule (40 mg total) by mouth daily.    glucose blood (ACCU-CHEK GUIDE) test strip USE TO TEST BLOOD SUGAR 2X A DAY    hydrALAZINE (APRESOLINE) 100 MG tablet TAKE 1 TABLET(100 MG) BY MOUTH TWICE DAILY    JARDIANCE 25 MG TABS tablet TAKE 1 TABLET(25 MG) BY MOUTH DAILY BEFORE BREAKFAST    lidocaine (LIDODERM) 5 % Place 1 patch onto the skin daily. Remove & Discard patch within 12 hours or as directed by MD    lidocaine-prilocaine (EMLA) cream APPLY A SMALL AMOUNT TO PORT SITE AT LEAST 1 HOUR PRIOR TO IT BEING ACCESSED, COVER WITH PLASTIC WRAP    metFORMIN (GLUCOPHAGE) 500 MG tablet Take 2 tablets (1,000 mg total) by mouth 2 (two) times daily with a meal.    metoprolol succinate (TOPROL-XL) 100 MG 24 hr tablet Take 1 tablet (100 mg total) by mouth 2 (two) times daily. Take with or immediately following a meal. To be taken with the 35m metoprolol tartrate    metoprolol tartrate (LOPRESSOR) 50 MG tablet Take 1 tablet (50 mg total) by mouth 2 (  two) times daily. To be taken with the $Remove'100mg'VVpkyBe$  metorprolol succinate.    montelukast (SINGULAIR) 10 MG tablet Take 10 mg by mouth daily.    morphine (MSIR) 15 MG tablet Take 1 tablet (15 mg total) by mouth every 6 (six) hours as needed for moderate pain or severe pain. Must last 30 days.    morphine (MSIR) 15 MG tablet Take 1 tablet (15 mg total) by mouth every 6 (six) hours as needed for moderate pain or severe pain. Must last 30 days.    [START ON 01/19/2021]  morphine (MSIR) 15 MG tablet Take 1 tablet (15 mg total) by mouth every 6 (six) hours as needed for moderate pain or severe pain. Must last 30 days. 11/20/2020: WARNING: Not a Duplicate. Future prescription. DO NOT DELETE during hospital medication reconciliation or at discharge. [facility] Chronic Pain Management Patient    Multiple Vitamin (MULTIVITAMIN WITH MINERALS) TABS tablet Take 1 tablet by mouth daily.    NONFORMULARY OR COMPOUNDED ITEM Apply 1-2 mLs topically 4 (four) times daily as needed. 10% Ketamine/2% Cyclobenzaprine/6% Gabapentin Cream 11/20/2020: DO NOT DELETE during hospital medication reconciliation or at discharge. [facility] Chronic Pain Management Patient    nortriptyline (PAMELOR) 25 MG capsule Take 2 capsules (50 mg total) by mouth at bedtime.    Potassium Chloride ER 20 MEQ TBCR Take 1 tablet by mouth daily.    STIOLTO RESPIMAT 2.5-2.5 MCG/ACT AERS Inhale 2 puffs into the lungs 4 (four) times daily as needed. Prn per pulmonology notes    SUMAtriptan (IMITREX) 50 MG tablet Take 1 tablet (50 mg total) by mouth every 2 (two) hours as needed for migraine. Take 1 tab at onset of migraine. May repeat in 2 hours if headache persists or recurs.    Facility-Administered Encounter Medications as of 01/12/2021  Medication   0.9 %  sodium chloride infusion   sodium chloride flush (NS) 0.9 % injection 10 mL    Patient Active Problem List   Diagnosis Date Noted   Chronic use of opiate for therapeutic purpose 08/20/2020   Malignant neoplasm of lung ([place])    Goals of care, counseling/discussion 11/01/2019   Pulmonary emphysema ([place]) 08/25/2019   OSA (obstructive sleep apnea) 06/16/2019   History of colonic polyps 02/02/2019   Shortness of breath 02/02/2019   Chronic upper extremity pain (3ry area of Pain) (Bilateral) (L>R) 01/19/2019   Squamous cell carcinoma of left lung ([place]) 01/16/2019   Lung mass 01/14/2019   Left lower lobe pulmonary nodule 10/26/2018   History of Allergy to amide type  local anesthetic (Lidocaine) 10/21/2018    Class: History of   Hyperlipidemia associated with type 2 diabetes mellitus ([place]) 07/20/2018   Type 2 diabetes mellitus with stage 1 chronic kidney disease, without long-term current use of insulin ([place]) 12/30/2017   S/P hip replacement 12/02/2017   Hip arthritis 10/01/2017   Aortic atherosclerosis ([place]) 08/25/2017   Left-sided weakness 08/13/2017   Musculoskeletal pain, chronic 04/03/2017   History of tobacco abuse 06/24/2016   GERD (gastroesophageal reflux disease) 06/24/2016   Tobacco abuse 06/24/2016   Pharmacologic therapy    Benign neoplasm of ascending colon    Polyp of sigmoid colon    Rectal polyp    Benign hypertensive renal disease 01/22/2016   Migraine 01/22/2016   Chronic pain syndrome 01/11/2016   Chronic sacroiliac joint pain (Bilateral) (L>R) 05/31/2015   Chronic hip pain (Left) 05/31/2015   Lumbar facet syndrome (Bilateral) (L>R) 05/31/2015   Chronic lower extremity pain (2ry area of  Pain) (Left) 05/31/2015   Greater occipital neuralgia (Right) 05/31/2015   Retrolisthesis of L5-S1 02/06/2015   Cervical disc herniation (C4-5 and C5-6) 02/06/2015   Lumbar disc herniation (L5-S1) 02/06/2015   Hypokalemia 02/01/2015   Cervical spinal stenosis (C4-5) 01/06/2015   Cervical foraminal stenosis (Bilateral C5-6) 01/06/2015   Chronic low back pain (1ry area of Pain) (Bilateral) (L>R) 01/05/2015   Lumbar spondylosis 01/05/2015   Chronic lumbar radicular pain (S1 dermatomal) (Left) 01/05/2015   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) 01/05/2015   Chronic neck pain (posterior midline) (Bilateral) (L>R) 01/05/2015   Cervical spondylosis 01/05/2015   Chronic cervical radicular pain (Bilateral) (C5/C6 dermatome) (L>R) 01/05/2015   Long term current use of opiate analgesic 01/05/2015   Long term prescription opiate use 01/05/2015   Opiate use (60 MME/Day) 41/66/0630   Uncomplicated opioid dependence ([place])  01/05/2015    Conditions to be addressed/monitored:HTN, DMII, and Lung Cancer and skin tags/Lesions to back and chest  Care Plan : RNCM: Cancer Treatment Phase (Adult)- Lung cancer  Updates made by Vanita Ingles, RN since 01/12/2021 12:00 AM  Completed 01/12/2021   Problem: TNCM: Cancer treatment plan Resolved 01/12/2021  Priority: High     Long-Range Goal: RNCM: Cancer treatment- actie phase of treatment Completed 01/12/2021  Start Date: 04/19/2020  Expected End Date: 08/08/2021  Recent Progress: On track  Priority: High  Note:   Current Barriers: Resolving, duplicate goal  Knowledge Deficits related to treatment plan and how long treatments will last for current phase of lung cancer treatment Chronic Disease Management support and education needs related to effective management of lung cancer  Lacks caregiver support.  Unable to independently manage lung cancer to the left lung Lacks social connections Does not contact provider office for questions/concerns  Nurse Case Manager Clinical Goal(s):  patient will verbalize understanding of plan for effective management of lung cancer and continuing treatment regimen  patient will work with RNCM, pcp, and specialist  to address needs related to lung cancer and ongoing treatment  patient will demonstrate a decrease in side effects exacerbations as evidenced by maintaining weight, tolerating diet, taking medications to help with potential side effects and working with the CCM team to optimize health and well being patient will attend all scheduled medical appointments:03-06-2020 the patient will demonstrate ongoing self health care management ability as evidenced by completion of chemotherapy and improved condition   Interventions:  1:1 collaboration with [name], DO regarding development and update of comprehensive plan of care as evidenced by provider attestation and co-signature Inter-disciplinary care team collaboration (see  longitudinal plan of care) Evaluation of current treatment plan related to lung cancer  and patient's adherence to plan as established by provider. 07-21-2020: The patient saw the oncologist on 07-19-2020 and got a good report. The cancer is not completely gone but has seen a big improvement. The patient states that he has 23 more treatments and they will reevaluate. He says he wants as many as he needs because he is going to "beat" this. The patient is very optimistic and is following the plan of care. He states he is sleeping well and eating well. He denies any acute side effects from his cancer treatments. He says he is doing very well according to his oncologist and happy with the results thus far. 09-22-2020: The patient states he feels great and is still taking his treatments without difficulty. The patient says he will know on 09-27-2020 what the plan for treatment will be  and how many additional chemo treatments he will have to take next week. He says they want to make sure that they have eradicated all of the cancer cells. He states he is eating good and maintaining his weight. Is a little tired at times but he was working on his racecar at the time of the outreach call. Will continue to monitor for changes and new needs. 11-24-2020: The patient is continuing with treatments. The patient states he has been a little more tired the last couple of chemo treatments and came home and took a nap. Talked about pacing activity. He says he doesn't let it slow him down. He is helping his cousin put a new floor down. He is concerned about the hospital bills he is getting but he tries not to let that bother him. He denies any acute distress. Will have a scan to check status of lung cancer on 12-07-2020. Emotional support and education given.  Advised patient to call the office for changes or questions, the patient is seeing specialist regularly  Provided education to patient re: maintaining weight, monitoring for side  effects from chemotherapy, safety, adequate rest and working with the CCM team to optimize health and well being  Reviewed scheduled/upcoming provider appointments including: 03-06-2021 at 0840 am Discussed plans with patient for ongoing care management follow up and provided patient with direct contact information for care management team  Patient Goals/Self-Care Activities  patient will:  - Patient will self administer medications as prescribed Patient will attend all scheduled provider appointments Patient will call pharmacy for medication refills Patient will attend church or other social activities Patient will continue to perform ADL's independently Patient will continue to perform IADL's independently Patient will call provider office for new concerns or questions Patient will work with BSW to address care coordination needs and will continue to work with the clinical team to address health care and disease management related needs.   - alternating periods of activity with rest promoted - coagulation studies reviewed and trended - diet adjustment recommended - fall and injury prevention strategies promoted - gentle oral care promoted - individualized medical nutrition therapy provided - signs/symptoms of bleeding reviewed  Follow Up Plan: Telephone follow up appointment with care management team member scheduled for: 01-12-2021 at 230 pm        Care Plan : RNCM: Hypertension (Adult)  Updates made by Vanita Ingles, RN since 01/12/2021 12:00 AM  Completed 01/12/2021   Problem: RNCM: Hypertension (Hypertension) Resolved 01/12/2021  Priority: Medium     Long-Range Goal: RNCM: Hypertension Monitored Completed 01/12/2021  Start Date: 04/19/2020  Expected End Date: 07/19/2021  Recent Progress: On track  Priority: High  Note:   Objective: Resolving, duplicate goal  Last practice recorded BP readings:  BP Readings from Last 3 Encounters:  11/22/20 124/81  11/20/20 (!) 134/95   11/08/20 121/79   Most recent eGFR/CrCl:  Lab Results  Component Value Date   EGFR 99 09/01/2020    No components found for: CRCL Current Barriers:  Knowledge Deficits related to basic understanding of hypertension pathophysiology and self care management Knowledge Deficits related to understanding of medications prescribed for management of hypertension Unable to independently HTN Needing clarification on medications. 09-22-2020- addressed today and clarification made. Collaboration with the pcp, RNCM, patient and patients wife Case Manager Clinical Goal(s):  patient will verbalize understanding of plan for hypertension management patient will attend all scheduled medical appointments: 03-06-2021 at 0840 am patient will demonstrate improved adherence to prescribed treatment  plan for hypertension as evidenced by taking all medications as prescribed, monitoring and recording blood pressure as directed, adhering to low sodium/DASH diet patient will demonstrate improved health management independence as evidenced by checking blood pressure as directed and notifying PCP if SBP>160 or DBP > 90, taking all medications as prescribe, and adhering to a low sodium diet as discussed. patient will verbalize basic understanding of hypertension disease process and self health management plan as evidenced by compliance with medications, compliance with heart healthy/ADA diet, and working with the CCM team to optimize health and well being Interventions:  Collaboration with [name], DO regarding development and update of comprehensive plan of care as evidenced by provider attestation and co-signature Inter-disciplinary care team collaboration (see longitudinal plan of care) Evaluation of current treatment plan related to hypertension self management and patient's adherence to plan as established by provider.  09-22-2020: On outreach today the patient states that he needed refills on his Metoprolol and  his wife had called the office and that she had not received a call back. The patient ask the RNCM to call his wife. Call to his wife revealed that he had been taking Metoprolol succinate 100 mg BID and Metoprolol tartrate 50 mg BID. Message sent to Dr. Wynetta Emery who called RNCM, collaboration done and scripts for both medications sent to the pharmacy for refill. The patients wife was called back by the [facility] and informed of refill information and to check blood pressures regularly. The wife states that the patients blood pressures are check daily and they have been within normal limits. Education on sx and sx of hypotension and what to look for. The patients wife verbalized understanding. Will continue to monitor. 11-24-2020: The patient is doing well and denies any issues related to his blood pressures. States that his blood pressures are WNL.  Provided education to patient re: stroke prevention, s/s of heart attack and stroke, DASH diet, complications of uncontrolled blood pressure Reviewed medications with patient and discussed importance of compliance. 09-22-2020: Extensive review of medications and how the patient has been taking. Confirmation made by the patient and patients wife that he takes both Metoprolol tartrate and Metoprolol succinate. PCP has sent new scripts in for medications. Will continue to monitor for changes and educational needs. 11-24-2020: The patient states compliance with medications. Denies any needs related to medications at this time. Will continue to monitor.  Discussed plans with patient for ongoing care management follow up and provided patient with direct contact information for care management team Advised patient, providing education and rationale, to monitor blood pressure daily and record, calling PCP for findings outside established parameters.  Reviewed scheduled/upcoming provider appointments including: 03-06-2021 at 0840 am Self-Care Activities: - Self administers  medications as prescribed Attends all scheduled provider appointments Calls provider office for new concerns, questions, or BP outside discussed parameters Checks BP and records as discussed Follows a low sodium diet/DASH diet Patient Goals: - check blood pressure daily - write blood pressure results in a log or diary - agree on reward when goals are met - agree to work together to make changes - ask questions to understand - have a family meeting to talk about healthy habits - learn about high blood pressure  Follow Up Plan: Telephone follow up appointment with care management team member scheduled for: 11-11--2022 at 230 pm    Care Plan : RNCM: General Plan of Care (Adult) for Chronic Disease Management and Care Coordination Needs  Updates made by Vanita Ingles,  RN since 01/12/2021 12:00 AM     Problem: RNCM: Development of Plan of Care for Chronic Disease Management (DM, HTN, Lung Cancer)   Priority: High     Long-Range Goal: RNCM: Effective Management  of Plan of Care for Chronic Disease Management (DM, HTN, Lung Cancer)   Start Date: 01/12/2021  Expected End Date: 01/12/2022  Priority: High  Note:   Current Barriers:  Knowledge Deficits related to plan of care for management of HTN, DMII, and Lung cancer  Chronic Disease Management support and education needs related to HTN, DMII, and Lung cancer   RNCM Clinical Goal(s):  Patient will verbalize understanding of plan for management of HTN, DMII, and lung cancer  as evidenced by keeping appointments, following the plan of care, working with the CCM team to effectively manage health and well being  demonstrate understanding of rationale for each prescribed medication as evidenced by compliance with medications and calling for refills before running out of medications     attend all scheduled medical appointments: 03-06-2021 at 0840 am as evidenced by keeping appointments and calling the office if appointment needs to be  reschduled         demonstrate improved and ongoing adherence to prescribed treatment plan for HTN, DMII, and lung cancer  as evidenced by effective management of chronic conditions by working with the CCM team to optimize health and well being  demonstrate a decrease in HTN, DMII, and Lung cancer  exacerbations  as evidenced by effective management of chronic conditions  demonstrate ongoing self health care management ability for effective management of chronic conditions  as evidenced by working with the CCM team through collaboration with Consulting civil engineer, provider, and care team.   Interventions: 1:1 collaboration with primary care provider regarding development and update of comprehensive plan of care as evidenced by provider attestation and co-signature Inter-disciplinary care team collaboration (see longitudinal plan of care) Evaluation of current treatment plan related to  self management and patient's adherence to plan as established by provider   SDOH Barriers (Status: Goal on Track (progressing): YES.) Long Term Goal  Patient interviewed and SDOH assessment performed        Patient interviewed and appropriate assessments performed Provided patient with information about resources available and care guides to assist with new needs or changes in SDOH Discussed plans with patient for ongoing care management follow up and provided patient with direct contact information for care management team Advised patient to call the office for changes in [place], questions or concerns    Diabetes:  (Status: Goal on Track (progressing): YES.) Long Term Goal   Lab Results  Component Value Date   HGBA1C 6.1 09/01/2020  Assessed patient's understanding of A1c goal: <7% Provided education to patient about basic DM disease process; Reviewed medications with patient and discussed importance of medication adherence;        Reviewed prescribed diet with patient heart healthy/ADA diet ; Counseled on  importance of regular laboratory monitoring as prescribed;        Discussed plans with patient for ongoing care management follow up and provided patient with direct contact information for care management team;      Provided patient with written educational materials related to hypo and hyperglycemia and importance of correct treatment;       Reviewed scheduled/upcoming provider appointments including: 03-06-2021 at 0840 am;         Advised patient, providing education and rationale, to check cbg as directed  and record  call provider for findings outside established parameters;       Review of patient status, including review of consultants reports, relevant laboratory and other test results, and medications completed;       Screening for signs and symptoms of depression related to chronic disease state;        Assessed social determinant of health barriers;         Hypertension: (Status: Goal on Track (progressing): YES.) Last practice recorded BP readings:  BP Readings from Last 3 Encounters:  01/03/21 133/86  12/21/20 136/88  12/20/20 (!) 132/91  Most recent eGFR/CrCl:  Lab Results  Component Value Date   EGFR 99 09/01/2020    No components found for: CRCL  Evaluation of current treatment plan related to hypertension self management and patient's adherence to plan as established by provider;   Provided education to patient re: stroke prevention, s/s of heart attack and stroke; Reviewed prescribed diet heart healthy/ADA Reviewed medications with patient and discussed importance of compliance;  Discussed plans with patient for ongoing care management follow up and provided patient with direct contact information for care management team; Advised patient, providing education and rationale, to monitor blood pressure daily and record, calling PCP for findings outside established parameters;  Provided education on prescribed diet heart healthy/ADA;  Discussed complications of poorly  controlled blood pressure such as heart disease, stroke, circulatory complications, vision complications, kidney impairment, sexual dysfunction;    Lung cancer   (Status: Goal on Track (progressing): YES.) Long Term Goal  Evaluation of current treatment plan related to  Lung cancer  ,  self-management and patient's adherence to plan as established by provider. Discussed plans with patient for ongoing care management follow up and provided patient with direct contact information for care management team Advised patient to keep appointments, calling the office for changes in conditions, or sx and sx of adverse reactions to chemotherapy treatment for lung cancer ; Provided education to patient re: sx and sx to monitor for changes, reporting increased fatigue, nausea, or pain, maintaining nutrition and utilization of support system ; Reviewed medications with patient and discussed compliance ; Reviewed scheduled/upcoming provider appointments including 03-06-2021 at 0840 am; Discussed plans with patient for ongoing care management follow up and provided patient with direct contact information for care management team; Advised patient to discuss changes in disease progression with provider;    Skin tag/skin lesion to chest and back   (Status: New goal.) Short Term Goal  Evaluation of current treatment plan related to  acute onset of skin tag or lesion to back and chest ,  self-management and patient's adherence to plan as established by provider. Discussed plans with patient for ongoing care management follow up and provided patient with direct contact information for care management team Advised patient to call the office and secure an appointment for follow up on skin tag or lession to mid back and chest. The patient states it is an area that is sore and the cancer center states that it is something the pcp needs to evaluate; Provided education to patient re: sx and sx of infection, calling the office  for changes, and monitoring for changes in the 2 areas of concern; Collaborated with pcp regarding new onset of skin tags/lesion to back and chest; Reviewed scheduled/upcoming provider appointments including 03-06-2021, advised the patient he may need a sooner appointment to call and set up and appointment to see the pcp; Discussed plans with patient for ongoing care management follow up and provided  patient with direct contact information for care management team;   Patient Goals/Self-Care Activities: Patient will self administer medications as prescribed as evidenced by self report/primary caregiver report  Patient will attend all scheduled provider appointments as evidenced by clinician review of documented attendance to scheduled appointments and patient/caregiver report Patient will call pharmacy for medication refills as evidenced by patient report and review of pharmacy fill history as appropriate Patient will attend church or other social activities as evidenced by patient report Patient will continue to perform ADL's independently as evidenced by patient/caregiver report Patient will continue to perform IADL's independently as evidenced by patient/caregiver report Patient will call provider office for new concerns or questions as evidenced by review of documented incoming telephone call notes and patient report Patient will work with BSW to address care coordination needs and will continue to work with the clinical team to address health care and disease management related needs as evidenced by documented adherence to scheduled care management/care coordination appointments schedule appointment with eye doctor check blood sugar at prescribed times: when you have symptoms of low or high blood sugar, before and after exercise, and as directed   check feet daily for cuts, sores or redness enter blood sugar readings and medication or insulin into daily log take the blood sugar log to all doctor  visits trim toenails straight across drink 6 to 8 glasses of water each day eat fish at least once per week fill half of plate with vegetables limit fast food meals to no more than 1 per week manage portion size prepare main meal at home 3 to 5 days each week read food labels for fat, fiber, carbohydrates and portion size reduce red meat to 2 to 3 times a week set a realistic goal keep feet up while sitting wash and dry feet carefully every day wear comfortable, cotton socks wear comfortable, well-fitting shoes - check blood pressure weekly - choose a place to take my blood pressure (home, clinic or office, retail store) - learn about high blood pressure - keep a blood pressure log - take blood pressure log to all doctor appointments - call doctor for signs and symptoms of high blood pressure - develop an action plan for high blood pressure - keep all doctor appointments - take medications for blood pressure exactly as prescribed - report new symptoms to your doctor - eat more whole grains, fruits and vegetables, lean meats and healthy fats       Plan:Telephone follow up appointment with care management team member scheduled for:  03-16-2021 at 230 pm  Rochelle, MSN, [facility] Mobile: [phone]

## 2021-01-17 ENCOUNTER — Inpatient Hospital Stay: Payer: Medicare Other

## 2021-01-17 ENCOUNTER — Other Ambulatory Visit: Payer: Self-pay | Admitting: Oncology

## 2021-01-17 ENCOUNTER — Other Ambulatory Visit: Payer: Self-pay

## 2021-01-17 VITALS — BP 140/84 | HR 71 | Temp 98.2°F | Resp 18 | Wt 225.8 lb

## 2021-01-17 DIAGNOSIS — Z833 Family history of diabetes mellitus: Secondary | ICD-10-CM | POA: Diagnosis not present

## 2021-01-17 DIAGNOSIS — K760 Fatty (change of) liver, not elsewhere classified: Secondary | ICD-10-CM | POA: Diagnosis not present

## 2021-01-17 DIAGNOSIS — Z809 Family history of malignant neoplasm, unspecified: Secondary | ICD-10-CM | POA: Diagnosis not present

## 2021-01-17 DIAGNOSIS — Z79899 Other long term (current) drug therapy: Secondary | ICD-10-CM | POA: Diagnosis not present

## 2021-01-17 DIAGNOSIS — I700 Atherosclerosis of aorta: Secondary | ICD-10-CM | POA: Diagnosis not present

## 2021-01-17 DIAGNOSIS — Z8249 Family history of ischemic heart disease and other diseases of the circulatory system: Secondary | ICD-10-CM | POA: Diagnosis not present

## 2021-01-17 DIAGNOSIS — E119 Type 2 diabetes mellitus without complications: Secondary | ICD-10-CM | POA: Diagnosis not present

## 2021-01-17 DIAGNOSIS — Z5112 Encounter for antineoplastic immunotherapy: Secondary | ICD-10-CM | POA: Diagnosis not present

## 2021-01-17 DIAGNOSIS — F17210 Nicotine dependence, cigarettes, uncomplicated: Secondary | ICD-10-CM | POA: Diagnosis not present

## 2021-01-17 DIAGNOSIS — C3492 Malignant neoplasm of unspecified part of left bronchus or lung: Secondary | ICD-10-CM

## 2021-01-17 DIAGNOSIS — C3432 Malignant neoplasm of lower lobe, left bronchus or lung: Secondary | ICD-10-CM | POA: Diagnosis not present

## 2021-01-17 MED ORDER — SODIUM CHLORIDE 0.9% FLUSH
10.0000 mL | INTRAVENOUS | Status: DC | PRN
  Administered 2021-01-17: 10 mL via INTRAVENOUS
  Filled 2021-01-17: qty 10

## 2021-01-17 MED ORDER — HEPARIN SOD (PORK) LOCK FLUSH 100 UNIT/ML IV SOLN
500.0000 [IU] | Freq: Once | INTRAVENOUS | Status: AC
  Filled 2021-01-17: qty 5

## 2021-01-17 MED ORDER — HEPARIN SOD (PORK) LOCK FLUSH 100 UNIT/ML IV SOLN
INTRAVENOUS | Status: AC
  Administered 2021-01-17: 500 [IU] via INTRAVENOUS
  Filled 2021-01-17: qty 5

## 2021-01-17 MED ORDER — SODIUM CHLORIDE 0.9 % IV SOLN
10.0000 mg/kg | Freq: Once | INTRAVENOUS | Status: AC
  Administered 2021-01-17: 1000 mg via INTRAVENOUS
  Filled 2021-01-17: qty 20

## 2021-01-17 MED ORDER — SODIUM CHLORIDE 0.9 % IV SOLN
Freq: Once | INTRAVENOUS | Status: AC
  Filled 2021-01-17: qty 250

## 2021-01-17 NOTE — Patient Instructions (Signed)
[facility]   Discharge Instructions: Thank you for choosing [facility] to provide your oncology and hematology care.  If you have a lab appointment with the [facility], please go directly to the [facility] and check in at the registration area.  Wear comfortable clothing and clothing appropriate for easy access to any Portacath or PICC line.   We strive to give you quality time with your provider. You may need to reschedule your appointment if you arrive late (15 or more minutes).  Arriving late affects you and other patients whose appointments are after yours.  Also, if you miss three or more appointments without notifying the office, you may be dismissed from the clinic at the provider's discretion.      For prescription refill requests, have your pharmacy contact our office and allow 72 hours for refills to be completed.    Today you received the following chemotherapy and/or immunotherapy agents: Imfinzi.      To help prevent nausea and vomiting after your treatment, we encourage you to take your nausea medication as directed.  BELOW ARE SYMPTOMS THAT SHOULD BE REPORTED IMMEDIATELY: *FEVER GREATER THAN 100.4 F (38 C) OR HIGHER *CHILLS OR SWEATING *NAUSEA AND VOMITING THAT IS NOT CONTROLLED WITH YOUR NAUSEA MEDICATION *UNUSUAL SHORTNESS OF BREATH *UNUSUAL BRUISING OR BLEEDING *URINARY PROBLEMS (pain or burning when urinating, or frequent urination) *BOWEL PROBLEMS (unusual diarrhea, constipation, pain near the anus) TENDERNESS IN MOUTH AND THROAT WITH OR WITHOUT PRESENCE OF ULCERS (sore throat, sores in mouth, or a toothache) UNUSUAL RASH, SWELLING OR PAIN  UNUSUAL VAGINAL DISCHARGE OR ITCHING   Items with * indicate a potential emergency and should be followed up as soon as possible or go to the Emergency Department if any problems should occur.  Please show the CHEMOTHERAPY ALERT CARD or IMMUNOTHERAPY ALERT CARD at check-in  to the Emergency Department and triage nurse.  Should you have questions after your visit or need to cancel or reschedule your appointment, please contact [facility]  [phone] and follow the prompts.  Office hours are 8:00 a.m. to 4:30 p.m. Monday - Friday. Please note that voicemails left after 4:00 p.m. may not be returned until the following business day.  We are closed weekends and major holidays. You have access to a nurse at all times for urgent questions. Please call the main number to the clinic [phone] and follow the prompts.  For any non-urgent questions, you may also contact your provider using MyChart. We now offer e-Visits for anyone 10 and older to request care online for non-urgent symptoms. For details visit [email or web address].   Also download the MyChart app! Go to the app store, search "MyChart", open the app, select [facility], and log in with your MyChart username and password.  Due to Covid, a mask is required upon entering the hospital/clinic. If you do not have a mask, one will be given to you upon arrival. For doctor visits, patients may have 1 support person aged 57 or older with them. For treatment visits, patients cannot have anyone with them due to current Covid guidelines and our immunocompromised population.

## 2021-01-31 DIAGNOSIS — N181 Chronic kidney disease, stage 1: Secondary | ICD-10-CM | POA: Diagnosis not present

## 2021-01-31 DIAGNOSIS — C3432 Malignant neoplasm of lower lobe, left bronchus or lung: Secondary | ICD-10-CM

## 2021-01-31 DIAGNOSIS — F17210 Nicotine dependence, cigarettes, uncomplicated: Secondary | ICD-10-CM

## 2021-01-31 DIAGNOSIS — I129 Hypertensive chronic kidney disease with stage 1 through stage 4 chronic kidney disease, or unspecified chronic kidney disease: Secondary | ICD-10-CM

## 2021-01-31 DIAGNOSIS — E1122 Type 2 diabetes mellitus with diabetic chronic kidney disease: Secondary | ICD-10-CM | POA: Diagnosis not present

## 2021-01-31 DIAGNOSIS — Z7984 Long term (current) use of oral hypoglycemic drugs: Secondary | ICD-10-CM

## 2021-02-13 NOTE — Progress Notes (Signed)
PROVIDER NOTE: Information contained herein reflects review and annotations entered in association with encounter. Interpretation of such information and data should be left to medically-trained personnel. Information provided to patient can be located elsewhere in the medical record under "Patient Instructions". Document created using STT-dictation technology, any transcriptional errors that may result from process are unintentional.    Patient: Kristopher [name]  Service Category: E/M  Provider: Gaspar Cola, MD  DOB: 1960-06-17  DOS: 02/14/2021  Specialty: Interventional Pain Management  MRN: [id number]  Setting: Ambulatory outpatient  PCP: [name], DO  Type: Established Patient    Referring Provider: [name], DO  Location: Office  Delivery: Face-to-face     HPI  Kristopher [name], a 60 y.o. year old male, is here today because of his Chronic pain syndrome [G89.4]. Mr. Alesi primary complain today is Back Pain (Down left leg) Last encounter: My last encounter with him was on 11/20/2020. Pertinent problems: Mr. Nidiffer has Chronic low back pain (1ry area of Pain) (Bilateral) (L>R); Lumbar spondylosis; Chronic lumbar radicular pain (S1 dermatomal) (Left); 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); Chronic neck pain (posterior midline) (Bilateral) (L>R); Cervical spondylosis; Chronic cervical radicular pain (Bilateral) (C5/C6 dermatome) (L>R); Cervical spinal stenosis (C4-5); Cervical foraminal stenosis (Bilateral C5-6); Retrolisthesis of L5-S1; Cervical disc herniation (C4-5 and C5-6); Lumbar disc herniation (L5-S1); Chronic sacroiliac joint pain (Bilateral) (L>R); Chronic hip pain (Left); Lumbar facet syndrome (Bilateral) (L>R); Chronic lower extremity pain (2ry area of Pain) (Left); Greater occipital neuralgia (Right); Chronic pain syndrome; Migraine; Musculoskeletal pain, chronic; Left-sided weakness; Hip arthritis; S/P hip replacement; Squamous cell  carcinoma of left lung (HCC); and Chronic upper extremity pain (3ry area of Pain) (Bilateral) (L>R) on their pertinent problem list. Pain Assessment: Severity of Chronic pain is reported as a 4 /10. Location: Back Left, Right/pain radiaities down his left leg to his knee. Onset: More than a month ago. Quality: Aching, Constant, Tingling. Timing: Constant. Modifying factor(s): meds and compound cream. Vitals:  height is _0  (1.778 m) and weight is 210 lb (95.3 kg). His temperature is 97.1 F (36.2 C) (abnormal). His blood pressure is 124/83 and his pulse is 70. His respiration is 15 and oxygen saturation is 100%.   Reason for encounter: medication management.   The patient indicates doing well with the current medication regimen. No adverse reactions or side effects reported to the medications.  The patient today again requested a refill on his compounding cream.  However, when I looked at the prescription, it was written for 365 days with "PRN" refills, to last until 11/20/2021.  He says that he called the compounding pharmacy, [place] pharmacy, and he was told that there were no more refills on the prescription.  He says that he was told that he has to do with the fact that it is a controlled substance and they cannot do refills on it.  However, the only medication that I have in the make sure that is close to being a restricted medication would be the ketamine, but according to the DEA, it is a schedule III and therefore he should not be restricted in that fashion.  Today we will be calling the pharmacy to see what the problem is.  He keeps asking me for refills every time that he comes in when in fact he should have enough refills.  RTCB: 05/19/2021  Pharmacotherapy Assessment  Analgesic: Morphine IR 15 mg, 1 tablet p.o. every 6 hours (60 mg/day  of morphine) (60 MME) + Compounded Specialty Analgesic Cream (w/ Ketamine) MME/day: 60 mg/day.   Monitoring: [place] PMP: PDMP reviewed during  this encounter.       Pharmacotherapy: No side-effects or adverse reactions reported. Compliance: No problems identified. Effectiveness: Clinically acceptable.  Chauncey Fischer, RN  02/14/2021  8:17 AM  Sign when Signing Visit Nursing Pain Medication Assessment:  Safety precautions to be maintained throughout the outpatient stay will include: orient to surroundings, keep bed in low position, maintain call bell within reach at all times, provide assistance with transfer out of bed and ambulation.  Medication Inspection Compliance: Pill count conducted under aseptic conditions, in front of the patient. Neither the pills nor the bottle was removed from the patient's sight at any time. Once count was completed pills were immediately returned to the patient in their original bottle.  Medication: Morphine IR Pill/Patch Count:  17 of 120 pills remain Pill/Patch Appearance: Markings consistent with prescribed medication Bottle Appearance: Standard pharmacy container. Clearly labeled. Filled Date: 36 / 18 / 2022 Last Medication intake:  Today Safety precautions to be maintained throughout the outpatient stay will include: orient to surroundings, keep bed in low position, maintain call bell within reach at all times, provide assistance with transfer out of bed and ambulation.      UDS:  Summary  Date Value Ref Range Status  05/22/2020 Note  Final    Comment:    ==================================================================== [employer] Select 13 (MW) ==================================================================== Test                             Result       Flag       Units  Drug Present and Declared for Prescription Verification   Morphine                       >9259        EXPECTED   ng/mg creat   Normorphine                    92           EXPECTED   ng/mg creat    Potential sources of large amounts of morphine in the absence of    codeine include administration of morphine or use  of heroin.     Normorphine is an expected metabolite of morphine.  ==================================================================== Test                      Result    Flag   Units      Ref Range   Creatinine              108              mg/dL      >=20 ==================================================================== Declared Medications:  The flagging and interpretation on this report are based on the  following declared medications.  Unexpected results may arise from  inaccuracies in the declared medications.   **Note: The testing scope of this panel includes these medications:   Morphine (MSIR)   **Note: The testing scope of this panel does not include the  following reported medications:   Albuterol (Ventolin HFA)  Amlodipine (Norvasc)  Atorvastatin (Lipitor)  Budesonide  Diphenhydramine (Benadryl)  Empagliflozin (Jardiance)  Esomeprazole (Nexium)  Fluticasone  Formoterol  Glycopyrrolate  Hydralazine (Apresoline)  Metformin (Glucophage)  Metoprolol (Lopressor)  Montelukast (Singulair)  Multivitamin  Nortriptyline (Pamelor)  Olodaterol (Stiolto Respimat)  Pantoprazole (Protonix)  Potassium  Prilocaine (EMLA)  Sumatriptan (Imitrex)  Tiotropium (Stiolto Respimat)  Topical Cyclobenzaprine  Topical Gabapentin  Topical Ketamine  Topical Lidocaine (Lidoderm)  Topical Lidocaine (EMLA) ==================================================================== For clinical consultation, please call [phone]. ====================================================================      ROS  Constitutional: Denies any fever or chills Gastrointestinal: No reported hemesis, hematochezia, vomiting, or acute GI distress Musculoskeletal: Denies any acute onset joint swelling, redness, loss of ROM, or weakness Neurological: No reported episodes of acute onset apraxia, aphasia, dysarthria, agnosia, amnesia, paralysis, loss of coordination, or loss of  consciousness  Medication Review  Accu-Chek FastClix Lancets, Budeson-Glycopyrrol-Formoterol, NONFORMULARY OR COMPOUNDED ITEM, Potassium Chloride ER, SUMAtriptan, Tiotropium Bromide-Olodaterol, amLODipine, atorvastatin, diphenhydrAMINE, empagliflozin, esomeprazole, glucose blood, hydrALAZINE, lidocaine, metFORMIN, metoprolol succinate, metoprolol tartrate, montelukast, morphine, multivitamin with minerals, and nortriptyline  History Review  Allergy: Mr. Charlot is allergic to acetaminophen, aspirin, epinephrine, novocain [procaine], penicillins, strawberry extract, and shellfish allergy. Drug: Mr. Zhen  reports current drug use. Drug: Morphine. Alcohol:  reports no history of alcohol use. Tobacco:  reports that he has been smoking cigarettes. He has a 8.75 pack-year smoking history. He has never used smokeless tobacco. Social: Mr. Mccammon  reports that he has been smoking cigarettes. He has a 8.75 pack-year smoking history. He has never used smokeless tobacco. He reports current drug use. Drug: Morphine. He reports that he does not drink alcohol. Medical:  has a past medical history of Allergy, Arthritis, Benign hypertensive kidney disease, Chronic back pain, Diabetes mellitus, type 2 ([place]), Dyspnea, GERD (gastroesophageal reflux disease), Hypertension, Malignant neoplasm of lung ([place]), and Migraines. Surgical: Mr. Ballow  has a past surgical history that includes Back surgery; Appendectomy; Leg Surgery; Knee surgery (Left); Foot surgery (Left); Colonoscopy with propofol (N/A, 02/19/2016); polypectomy (N/A, 02/19/2016); Total hip arthroplasty (Left, 12/02/2017); Colonoscopy with propofol (N/A, 01/19/2018); polypectomy (01/19/2018); DG OPERATIVE LEFT HIP ([facility]); Joint replacement (Left, 12/2017); Video bronchoscopy (Left, 01/14/2019); Thoracotomy (Left, 01/14/2019); Flexible bronchoscopy (Bilateral, 01/20/2019); Flexible bronchoscopy (Bilateral, 01/22/2019); Electormagnetic navigation bronchoscopy  (Left, 11/18/2018); Lung cancer surgery; Video bronchoscopy with endobronchial ultrasound (N/A, 10/15/2019); Video bronchoscopy with endobronchial navigation (N/A, 10/15/2019); and Portacath placement (N/A, 11/11/2019). Family: family history includes Cancer in his father; Diabetes in his sister; Thrombosis in his sister.  Laboratory Chemistry Profile   Renal Lab Results  Component Value Date   BUN 12 01/03/2021   CREATININE 0.77 01/03/2021   BCR 11 09/01/2020   GFRAA >60 12/01/2019   GFRNONAA >60 01/03/2021    Hepatic Lab Results  Component Value Date   AST 48 (H) 01/03/2021   ALT 59 (H) 01/03/2021   ALBUMIN 4.5 01/03/2021   ALKPHOS 122 01/03/2021   LIPASE 50 02/17/2019    Electrolytes Lab Results  Component Value Date   NA 137 01/03/2021   K 3.8 01/03/2021   CL 102 01/03/2021   CALCIUM 9.1 01/03/2021   MG 2.4 01/25/2019   PHOS 3.5 01/23/2019    Bone No results found for: VD25OH, VD125OH2TOT, KZ6010XN2, TF5732KG2, 25OHVITD1, 25OHVITD2, 25OHVITD3, TESTOFREE, TESTOSTERONE  Inflammation (CRP: Acute Phase) (ESR: Chronic Phase) Lab Results  Component Value Date   CRP 0.7 04/03/2015   ESRSEDRATE 11 10/21/2017   LATICACIDVEN 1.7 01/16/2019         Note: Above Lab results reviewed.  Recent Imaging Review  CT Chest W Contrast CLINICAL DATA:  60 year old male with history of metastatic squamous cell carcinoma of the left lung status post left lower lobectomy in November 2020, followed by chemotherapy and  radiation therapy completed in 2021. Follow-up study. Intermittent right-sided chest discomfort for 1 week.  EXAM: CT CHEST WITH CONTRAST  TECHNIQUE: Multidetector CT imaging of the chest was performed during intravenous contrast administration.  CONTRAST:  15m OMNIPAQUE IOHEXOL 350 MG/ML SOLN  COMPARISON:  Chest CT 06/19/2020.  FINDINGS: Cardiovascular: Heart size is normal. There is no significant pericardial fluid, thickening or pericardial calcification. There  is aortic atherosclerosis, as well as atherosclerosis of the great vessels of the mediastinum and the coronary arteries, including calcified atherosclerotic plaque in the left anterior descending coronary artery. Left subclavian single-lumen porta cath with tip terminating in the proximal superior vena cava.  Mediastinum/Nodes: No pathologically enlarged mediastinal or hilar lymph nodes. Esophagus is unremarkable in appearance. No axillary lymphadenopathy.  Lungs/Pleura: Status post left lower lobectomy. Compensatory hyperexpansion of the left upper lobe. In the central aspect of the left upper lobe there are some regions with ground-glass attenuation, thickening of the peribronchovascular interstitium and regional architectural distortion, similar to the prior study, most compatible with chronic postradiation changes. 4 mm subpleural nodule in the posterior aspect of the right upper lobe (axial image 39 of series 3) stable on numerous prior examinations dating back to at least 08/13/2019, considered benign. No other more suspicious appearing pulmonary nodules or masses are noted. No acute consolidative airspace disease. No pleural effusions.  Upper Abdomen: Diffuse low attenuation throughout the visualized hepatic parenchyma, indicative of a background of hepatic steatosis.  Musculoskeletal: There are no aggressive appearing lytic or blastic lesions noted in the visualized portions of the skeleton.  IMPRESSION: 1. Status post left lower lobectomy with some mild chronic areas of postradiation fibrosis in the central perihilar aspect of the left upper lobe, similar to the prior study. No definite findings to suggest metastatic disease in the thorax. 2. Stable 4 mm right upper lobe pulmonary nodule, considered benign. 3. Aortic atherosclerosis, in addition to left anterior descending coronary artery disease. Please note that although the presence of coronary artery calcium  documents the presence of coronary artery disease, the severity of this disease and any potential stenosis cannot be assessed on this non-gated CT examination. Assessment for potential risk factor modification, dietary therapy or pharmacologic therapy may be warranted, if clinically indicated. 4. Hepatic steatosis.  Aortic Atherosclerosis (ICD10-I70.0).  Electronically Signed   By: DVinnie LangtonM.D.   On: 12/16/2020 08:24 Note: Reviewed        Physical Exam  General appearance: Well nourished, well developed, and well hydrated. In no apparent acute distress Mental status: Alert, oriented x 3 (person, place, & time)       Respiratory: No evidence of acute respiratory distress Eyes: PERLA Vitals: BP 124/83    Pulse 70    Temp (!) 97.1 F (36.2 C)    Resp 15    Ht _0  (1.778 m)    Wt 210 lb (95.3 kg)    SpO2 100%    BMI 30.13 kg/m  BMI: Estimated body mass index is 30.13 kg/m as calculated from the following:   Height as of this encounter: _1  (1.778 m).   Weight as of this encounter: 210 lb (95.3 kg). Ideal: Ideal body weight: 73 kg (160 lb 15 oz) Adjusted ideal body weight: 81.9 kg (180 lb 9 oz)  Assessment   Status Diagnosis  Controlled Controlled Controlled 1. Chronic pain syndrome   2. Chronic low back pain (1ry area of Pain) (Bilateral) (L>R)   3. Chronic lower extremity pain (2ry area of Pain) (Left)  4. Chronic upper extremity pain (3ry area of Pain) (Bilateral) (L>R)   5. 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)   6. Squamous cell carcinoma of left lung ([place])   7. Pharmacologic therapy   8. Chronic use of opiate for therapeutic purpose   9. Encounter for medication management   10. History of Allergy to amide type local anesthetic (Lidocaine)      Updated Problems: Problem  History of allergy to ester type local anesthetic (procaine)   The patient claims to have an anaphylactic allergy to lidocaine. Patient sent for testing to  [facility]. They called stating Bran is ok to use lidocaine. He has no allergy to Lidocaine. They did not test Naropin as they can only test one at a time.  Lidocaine is an amide local anesthetic agent.  Allergy to amide local anesthetics is extremely rare.  Allergy to [facility] local anesthetics is more common.  Some of the most common amide local anesthetics include: Lidocaine, bupivacaine, and ropivacaine.  Some of the most common [place] local anesthetics include: Chloroprocaine, procaine, and tetracaine.     Plan of Care  Problem-specific:  No problem-specific Assessment & Plan notes found for this encounter.  Mr. DAMONTAY ALRED has a current medication list which includes the following long-term medication(s): amlodipine, atorvastatin, esomeprazole, hydralazine, metformin, metoprolol succinate, metoprolol tartrate, [START ON 02/18/2021] morphine, [START ON 03/20/2021] morphine, [START ON 04/19/2021] morphine, nortriptyline, potassium chloride er, and sumatriptan.  Pharmacotherapy (Medications Ordered): Meds ordered this encounter  Medications   morphine (MSIR) 15 MG tablet    Sig: Take 1 tablet (15 mg total) by mouth every 6 (six) hours as needed for moderate pain or severe pain. Must last 30 days.    Dispense:  120 tablet    Refill:  0    DO NOT: delete (not duplicate); no partial-fill (will deny script to complete), no refill request (F/U required). DISPENSE: 1 day early if closed on fill date. WARN: No CNS-depressants within 8 hrs of med.   morphine (MSIR) 15 MG tablet    Sig: Take 1 tablet (15 mg total) by mouth every 6 (six) hours as needed for moderate pain or severe pain. Must last 30 days.    Dispense:  120 tablet    Refill:  0    DO NOT: delete (not duplicate); no partial-fill (will deny script to complete), no refill request (F/U required). DISPENSE: 1 day early if closed on fill date. WARN: No CNS-depressants within 8 hrs of med.   morphine (MSIR) 15 MG tablet    Sig:  Take 1 tablet (15 mg total) by mouth every 6 (six) hours as needed for moderate pain or severe pain. Must last 30 days.    Dispense:  120 tablet    Refill:  0    DO NOT: delete (not duplicate); no partial-fill (will deny script to complete), no refill request (F/U required). DISPENSE: 1 day early if closed on fill date. WARN: No CNS-depressants within 8 hrs of med.    Orders:  No orders of the defined types were placed in this encounter.  Follow-up plan:   Return in about 3 months (around 05/19/2021) for Eval-day (M,W), (F2F), (MM).     Interventional therapies: Planned, scheduled, and/or pending:   None at this time. Claims to have anaphylactic allergy to Local Anesthetics. He was sent for testing and shown/proven not to be allergic to Lidocaine.   Considering:   None at this time. Claims to have anaphylactic allergy  to Local Anesthetics. He was sent for testing and shown/proven not to be allergic to Lidocaine.   Palliative PRN treatment(s):   None at this time. Claims to have anaphylactic allergy to Local Anesthetics. He was sent for testing and shown/proven not to be allergic to Lidocaine.     Recent Visits Date Type Provider Dept  11/20/20 Office Visit [name], MD [facility]  Showing recent visits within past 90 days and meeting all other requirements Today's Visits Date Type Provider Dept  02/14/21 Office Visit [name], MD [facility]  Showing today's visits and meeting all other requirements Future Appointments Date Type Provider Dept  05/14/21 Appointment [name], MD [facility]  Showing future appointments within next 90 days and meeting all other requirements I discussed the assessment and treatment plan with the patient. The patient was provided an opportunity to ask questions and all were answered. The patient agreed with the plan and demonstrated an understanding of the instructions.  Patient advised to  call back or seek an in-person evaluation if the symptoms or condition worsens.  Duration of encounter: 30 minutes.  Note by: Gaspar Cola, MD Date: 02/14/2021; Time: 9:16 AM

## 2021-02-14 ENCOUNTER — Ambulatory Visit: Payer: Medicare Other | Attending: Pain Medicine | Admitting: Pain Medicine

## 2021-02-14 ENCOUNTER — Other Ambulatory Visit: Payer: Self-pay

## 2021-02-14 ENCOUNTER — Encounter: Payer: Self-pay | Admitting: Pain Medicine

## 2021-02-14 VITALS — BP 124/83 | HR 70 | Temp 97.1°F | Resp 15 | Ht 70.0 in | Wt 210.0 lb

## 2021-02-14 DIAGNOSIS — M5442 Lumbago with sciatica, left side: Secondary | ICD-10-CM | POA: Diagnosis not present

## 2021-02-14 DIAGNOSIS — M961 Postlaminectomy syndrome, not elsewhere classified: Secondary | ICD-10-CM | POA: Insufficient documentation

## 2021-02-14 DIAGNOSIS — G8929 Other chronic pain: Secondary | ICD-10-CM | POA: Diagnosis not present

## 2021-02-14 DIAGNOSIS — M79605 Pain in left leg: Secondary | ICD-10-CM | POA: Insufficient documentation

## 2021-02-14 DIAGNOSIS — M79601 Pain in right arm: Secondary | ICD-10-CM | POA: Diagnosis not present

## 2021-02-14 DIAGNOSIS — C3492 Malignant neoplasm of unspecified part of left bronchus or lung: Secondary | ICD-10-CM | POA: Diagnosis not present

## 2021-02-14 DIAGNOSIS — Z884 Allergy status to anesthetic agent status: Secondary | ICD-10-CM | POA: Insufficient documentation

## 2021-02-14 DIAGNOSIS — M5441 Lumbago with sciatica, right side: Secondary | ICD-10-CM | POA: Insufficient documentation

## 2021-02-14 DIAGNOSIS — Z79891 Long term (current) use of opiate analgesic: Secondary | ICD-10-CM | POA: Insufficient documentation

## 2021-02-14 DIAGNOSIS — M79602 Pain in left arm: Secondary | ICD-10-CM | POA: Diagnosis not present

## 2021-02-14 DIAGNOSIS — G894 Chronic pain syndrome: Secondary | ICD-10-CM | POA: Insufficient documentation

## 2021-02-14 DIAGNOSIS — Z79899 Other long term (current) drug therapy: Secondary | ICD-10-CM | POA: Insufficient documentation

## 2021-02-14 MED ORDER — MORPHINE SULFATE 15 MG PO TABS: 15.0000 mg | ORAL_TABLET | Freq: Four times a day (QID) | ORAL | 0 refills | Status: DC | PRN

## 2021-02-14 NOTE — Patient Instructions (Signed)

## 2021-02-14 NOTE — Progress Notes (Signed)
Nursing Pain Medication Assessment:  Safety precautions to be maintained throughout the outpatient stay will include: orient to surroundings, keep bed in low position, maintain call bell within reach at all times, provide assistance with transfer out of bed and ambulation.  Medication Inspection Compliance: Pill count conducted under aseptic conditions, in front of the patient. Neither the pills nor the bottle was removed from the patient's sight at any time. Once count was completed pills were immediately returned to the patient in their original bottle.  Medication: Morphine IR Pill/Patch Count:  17 of 120 pills remain Pill/Patch Appearance: Markings consistent with prescribed medication Bottle Appearance: Standard pharmacy container. Clearly labeled. Filled Date: 25 / 18 / 2022 Last Medication intake:  Today Safety precautions to be maintained throughout the outpatient stay will include: orient to surroundings, keep bed in low position, maintain call bell within reach at all times, provide assistance with transfer out of bed and ambulation.

## 2021-03-06 ENCOUNTER — Ambulatory Visit: Payer: Medicare Other | Admitting: Family Medicine

## 2021-03-08 ENCOUNTER — Encounter: Payer: Self-pay | Admitting: Oncology

## 2021-03-08 ENCOUNTER — Other Ambulatory Visit: Payer: Self-pay

## 2021-03-08 ENCOUNTER — Encounter: Payer: Self-pay | Admitting: Family Medicine

## 2021-03-08 ENCOUNTER — Ambulatory Visit (INDEPENDENT_AMBULATORY_CARE_PROVIDER_SITE_OTHER): Payer: Commercial Managed Care - HMO | Admitting: Family Medicine

## 2021-03-08 VITALS — BP 120/80 | HR 67 | Temp 97.9°F | Ht 70.0 in | Wt 210.0 lb

## 2021-03-08 DIAGNOSIS — I700 Atherosclerosis of aorta: Secondary | ICD-10-CM | POA: Diagnosis not present

## 2021-03-08 DIAGNOSIS — N181 Chronic kidney disease, stage 1: Secondary | ICD-10-CM | POA: Diagnosis not present

## 2021-03-08 DIAGNOSIS — E1169 Type 2 diabetes mellitus with other specified complication: Secondary | ICD-10-CM

## 2021-03-08 DIAGNOSIS — C3492 Malignant neoplasm of unspecified part of left bronchus or lung: Secondary | ICD-10-CM

## 2021-03-08 DIAGNOSIS — E1122 Type 2 diabetes mellitus with diabetic chronic kidney disease: Secondary | ICD-10-CM | POA: Diagnosis not present

## 2021-03-08 DIAGNOSIS — E785 Hyperlipidemia, unspecified: Secondary | ICD-10-CM

## 2021-03-08 DIAGNOSIS — Z1211 Encounter for screening for malignant neoplasm of colon: Secondary | ICD-10-CM

## 2021-03-08 DIAGNOSIS — J439 Emphysema, unspecified: Secondary | ICD-10-CM | POA: Diagnosis not present

## 2021-03-08 DIAGNOSIS — I129 Hypertensive chronic kidney disease with stage 1 through stage 4 chronic kidney disease, or unspecified chronic kidney disease: Secondary | ICD-10-CM | POA: Diagnosis not present

## 2021-03-08 LAB — MICROALBUMIN, URINE WAIVED
Creatinine, Urine Waived: 50 mg/dL (ref 10–300)
Microalb, Ur Waived: 10 mg/L (ref 0–19)

## 2021-03-08 LAB — URINALYSIS, ROUTINE W REFLEX MICROSCOPIC
Bilirubin, UA: NEGATIVE
Ketones, UA: NEGATIVE
Leukocytes,UA: NEGATIVE
Nitrite, UA: NEGATIVE
Protein,UA: NEGATIVE
RBC, UA: NEGATIVE
Specific Gravity, UA: 1.01 (ref 1.005–1.030)
Urobilinogen, Ur: 0.2 mg/dL (ref 0.2–1.0)
pH, UA: 6 (ref 5.0–7.5)

## 2021-03-08 LAB — BAYER DCA HB A1C WAIVED: HB A1C (BAYER DCA - WAIVED): 6.8 % — ABNORMAL HIGH (ref 4.8–5.6)

## 2021-03-08 MED ORDER — POTASSIUM CHLORIDE ER 20 MEQ PO TBCR
1.0000 | EXTENDED_RELEASE_TABLET | Freq: Every day | ORAL | 1 refills | Status: DC
Start: 2021-03-08 — End: 2021-09-07

## 2021-03-08 MED ORDER — NORTRIPTYLINE HCL 25 MG PO CAPS: 50.0000 mg | ORAL_CAPSULE | Freq: Every day | ORAL | 1 refills | Status: DC

## 2021-03-08 MED ORDER — METFORMIN HCL 500 MG PO TABS: 1000.0000 mg | ORAL_TABLET | Freq: Two times a day (BID) | ORAL | 1 refills | Status: DC

## 2021-03-08 MED ORDER — METOPROLOL SUCCINATE ER 100 MG PO TB24: 100.0000 mg | ORAL_TABLET | Freq: Two times a day (BID) | ORAL | 1 refills | Status: DC

## 2021-03-08 MED ORDER — ATORVASTATIN CALCIUM 10 MG PO TABS: 10.0000 mg | ORAL_TABLET | Freq: Every day | ORAL | 1 refills | Status: DC

## 2021-03-08 MED ORDER — AMLODIPINE BESYLATE 5 MG PO TABS: ORAL_TABLET | ORAL | 1 refills | Status: DC

## 2021-03-08 MED ORDER — HYDRALAZINE HCL 100 MG PO TABS: ORAL_TABLET | ORAL | 1 refills | Status: DC

## 2021-03-08 MED ORDER — METOPROLOL TARTRATE 50 MG PO TABS: 50.0000 mg | ORAL_TABLET | Freq: Two times a day (BID) | ORAL | 1 refills | Status: DC

## 2021-03-08 NOTE — Assessment & Plan Note (Signed)
Under good control on current regimen. Continue current regimen. Continue to monitor. Call with any concerns. Refills given. Labs drawn today.   

## 2021-03-08 NOTE — Progress Notes (Signed)
BP 120/80    Pulse 67    Temp 97.9 F (36.6 C) (Oral)    Ht 5\' 10"  (1.778 m)    Wt 210 lb (95.3 kg)    SpO2 96%    BMI 30.13 kg/m    Subjective:    Patient ID: Kristopher [name], male    DOB: [birth year], 61 y.o.   MRN: [id number]  HPI: Kristopher [name] is a 61 y.o. male  Chief Complaint  Patient presents with   Diabetes   Hyperlipidemia   HYPERTENSION / [place] Satisfied with current treatment? yes Duration of hypertension: chronic BP monitoring frequency: not checking BP medication side effects: no Past BP meds: amlodipine, hydrazlaine, metoprolol Duration of hyperlipidemia: chronic Cholesterol medication side effects: no Cholesterol supplements: none Past cholesterol medications: atorvastatin Medication compliance: excellent compliance Aspirin: no Recent stressors: no Recurrent headaches: no Visual changes: no Palpitations: no Dyspnea: no Chest pain: no Lower extremity edema: no Dizzy/lightheaded: no  DIABETES Hypoglycemic episodes:no Polydipsia/polyuria: no Visual disturbance: no Chest pain: no Paresthesias: no Glucose Monitoring: no  Accucheck frequency: Not Checking Taking Insulin?: no Blood Pressure Monitoring: not checking Retinal Examination: Up to Date Foot Exam: Up to Date Diabetic Education: Completed Pneumovax: Up to Date Influenza:  Declined Aspirin: no   Relevant past medical, surgical, family and social history reviewed and updated as indicated. Interim medical history since our last visit reviewed. Allergies and medications reviewed and updated.  Review of Systems  Constitutional: Negative.   Respiratory: Negative.    Cardiovascular: Negative.   Gastrointestinal: Negative.   Musculoskeletal: Negative.   Psychiatric/Behavioral: Negative.     Per HPI unless specifically indicated above     Objective:    BP 120/80    Pulse 67    Temp 97.9 F (36.6 C) (Oral)    Ht 5\' 10"  (1.778 m)    Wt 210 lb (95.3 kg)    SpO2 96%    BMI  30.13 kg/m   Wt Readings from Last 3 Encounters:  03/08/21 210 lb (95.3 kg)  02/14/21 210 lb (95.3 kg)  01/17/21 225 lb 12.8 oz (102.4 kg)    Physical Exam Vitals and nursing note reviewed.  Constitutional:      General: He is not in acute distress.    Appearance: Normal appearance. He is not ill-appearing, toxic-appearing or diaphoretic.  HENT:     Head: Normocephalic and atraumatic.     Right Ear: External ear normal.     Left Ear: External ear normal.     Nose: Nose normal.     Mouth/Throat:     Mouth: Mucous membranes are moist.     Pharynx: Oropharynx is clear.  Eyes:     General: No scleral icterus.       Right eye: No discharge.        Left eye: No discharge.     Extraocular Movements: Extraocular movements intact.     Conjunctiva/sclera: Conjunctivae normal.     Pupils: Pupils are equal, round, and reactive to light.  Cardiovascular:     Rate and Rhythm: Normal rate and regular rhythm.     Pulses: Normal pulses.     Heart sounds: Normal heart sounds. No murmur heard.   No friction rub. No gallop.  Pulmonary:     Effort: Pulmonary effort is normal. No respiratory distress.     Breath sounds: Normal breath sounds. No stridor. No wheezing, rhonchi or rales.  Chest:     Chest wall: No tenderness.  Musculoskeletal:  General: Normal range of motion.     Cervical back: Normal range of motion and neck supple.  Skin:    General: Skin is warm and dry.     Capillary Refill: Capillary refill takes less than 2 seconds.     Coloration: Skin is not jaundiced or pale.     Findings: No bruising, erythema, lesion or rash.  Neurological:     General: No focal deficit present.     Mental Status: He is alert and oriented to person, place, and time. Mental status is at baseline.  Psychiatric:        Mood and Affect: Mood normal.        Behavior: Behavior normal.        Thought Content: Thought content normal.        Judgment: Judgment normal.    Results for orders  placed or performed in visit on 03/08/21  Bayer DCA Hb A1c Waived  Result Value Ref Range   HB A1C (BAYER DCA - WAIVED) 6.8 (H) 4.8 - 5.6 %  Microalbumin, Urine Waived  Result Value Ref Range   Microalb, Ur Waived 10 0 - 19 mg/L   Creatinine, Urine Waived 50 10 - 300 mg/dL   Microalb/Creat Ratio 30-300 (H) <30 mg/g  Urinalysis, Routine w reflex microscopic  Result Value Ref Range   Specific Gravity, UA 1.010 1.005 - 1.030   pH, UA 6.0 5.0 - 7.5   Color, UA Yellow Yellow   Appearance Ur Clear Clear   Leukocytes,UA Negative Negative   Protein,UA Negative Negative/Trace   Glucose, UA 3+ (A) Negative   Ketones, UA Negative Negative   RBC, UA Negative Negative   Bilirubin, UA Negative Negative   Urobilinogen, Ur 0.2 0.2 - 1.0 mg/dL   Nitrite, UA Negative Negative      Assessment & Plan:   Problem List Items Addressed This Visit       Cardiovascular and Mediastinum   Aortic atherosclerosis ([place])    Will keep BP, cholesterol and sugars under good control. Continue to monitor. Call with any concerns.       Relevant Medications   amLODipine (NORVASC) 5 MG tablet   atorvastatin (LIPITOR) 10 MG tablet   hydrALAZINE (APRESOLINE) 100 MG tablet   metoprolol succinate (TOPROL-XL) 100 MG 24 hr tablet   metoprolol tartrate (LOPRESSOR) 50 MG tablet     Respiratory   Squamous cell carcinoma of left lung (HCC)    Continue to follow with oncology. Call with any concerns. Continue to monitor.       Pulmonary emphysema ([place])    Under good control on current regimen. Continue current regimen. Continue to monitor. Call with any concerns. Refills through pulmonology. Call with any concerns.          Endocrine   Type 2 diabetes mellitus with stage 1 chronic kidney disease, without long-term current use of insulin ([place]) - Primary    Up slightly with A1c of 6.8- will continue current regimen and continue to monitor. Recheck 3 months.       Relevant Medications   atorvastatin (LIPITOR)  10 MG tablet   metFORMIN (GLUCOPHAGE) 500 MG tablet   Other Relevant Orders   Comprehensive metabolic panel   CBC with Differential/Platelet   Bayer DCA Hb A1c Waived (Completed)   Microalbumin, Urine Waived (Completed)   Urinalysis, Routine w reflex microscopic (Completed)   Hyperlipidemia associated with type 2 diabetes mellitus ([place])    Under good control on current regimen. Continue current  regimen. Continue to monitor. Call with any concerns. Refills given. Labs drawn today.       Relevant Medications   amLODipine (NORVASC) 5 MG tablet   atorvastatin (LIPITOR) 10 MG tablet   hydrALAZINE (APRESOLINE) 100 MG tablet   metFORMIN (GLUCOPHAGE) 500 MG tablet   metoprolol succinate (TOPROL-XL) 100 MG 24 hr tablet   metoprolol tartrate (LOPRESSOR) 50 MG tablet   Other Relevant Orders   Comprehensive metabolic panel   CBC with Differential/Platelet   Lipid Panel w/o Chol/HDL Ratio     Genitourinary   Benign hypertensive renal disease    Under good control on current regimen. Continue current regimen. Continue to monitor. Call with any concerns. Refills given. Labs drawn today.       Relevant Orders   Comprehensive metabolic panel   CBC with Differential/Platelet   Microalbumin, Urine Waived (Completed)   TSH   Other Visit Diagnoses     Screening for colon cancer       Referral to GI placed today.   Relevant Orders   Ambulatory referral to Gastroenterology        Follow up plan: Return in about 3 months (around 06/06/2021).

## 2021-03-08 NOTE — Assessment & Plan Note (Signed)
Continue to follow with oncology. Call with any concerns. Continue to monitor.

## 2021-03-08 NOTE — Assessment & Plan Note (Signed)
Up slightly with A1c of 6.8- will continue current regimen and continue to monitor. Recheck 3 months.

## 2021-03-08 NOTE — Assessment & Plan Note (Signed)
Under good control on current regimen. Continue current regimen. Continue to monitor. Call with any concerns. Refills through pulmonology. Call with any concerns.

## 2021-03-08 NOTE — Assessment & Plan Note (Signed)
Will keep BP, cholesterol and sugars under good control. Continue to monitor. Call with any concerns.

## 2021-03-09 ENCOUNTER — Ambulatory Visit: Payer: Medicare Other | Admitting: Family Medicine

## 2021-03-09 ENCOUNTER — Encounter: Payer: Self-pay | Admitting: Oncology

## 2021-03-09 LAB — CBC WITH DIFFERENTIAL/PLATELET
Basophils Absolute: 0.1 10*3/uL (ref 0.0–0.2)
Basos: 1 %
EOS (ABSOLUTE): 0.1 10*3/uL (ref 0.0–0.4)
Eos: 2 %
Hematocrit: 46.2 % (ref 37.5–51.0)
Hemoglobin: 15.3 g/dL (ref 13.0–17.7)
Immature Grans (Abs): 0.1 10*3/uL (ref 0.0–0.1)
Immature Granulocytes: 1 %
Lymphocytes Absolute: 1.5 10*3/uL (ref 0.7–3.1)
Lymphs: 26 %
MCH: 29.5 pg (ref 26.6–33.0)
MCHC: 33.1 g/dL (ref 31.5–35.7)
MCV: 89 fL (ref 79–97)
Monocytes Absolute: 0.5 10*3/uL (ref 0.1–0.9)
Monocytes: 8 %
Neutrophils Absolute: 3.7 10*3/uL (ref 1.4–7.0)
Neutrophils: 62 %
Platelets: 201 10*3/uL (ref 150–450)
RBC: 5.19 x10E6/uL (ref 4.14–5.80)
RDW: 13.3 % (ref 11.6–15.4)
WBC: 5.9 10*3/uL (ref 3.4–10.8)

## 2021-03-09 LAB — LIPID PANEL W/O CHOL/HDL RATIO
Cholesterol, Total: 206 mg/dL — ABNORMAL HIGH (ref 100–199)
HDL: 32 mg/dL — ABNORMAL LOW (ref >39)
LDL Chol Calc (NIH): 128 mg/dL — ABNORMAL HIGH (ref 0–99)
Triglycerides: 257 mg/dL — ABNORMAL HIGH (ref 0–149)
VLDL Cholesterol Cal: 46 mg/dL — ABNORMAL HIGH (ref 5–40)

## 2021-03-09 LAB — COMPREHENSIVE METABOLIC PANEL
ALT: 66 IU/L — ABNORMAL HIGH (ref 0–44)
AST: 51 IU/L — ABNORMAL HIGH (ref 0–40)
Albumin/Globulin Ratio: 1.7 (ref 1.2–2.2)
Albumin: 4.8 g/dL (ref 3.8–4.9)
Alkaline Phosphatase: 155 IU/L — ABNORMAL HIGH (ref 44–121)
BUN/Creatinine Ratio: 7 — ABNORMAL LOW (ref 9–20)
BUN: 7 mg/dL (ref 6–24)
Bilirubin Total: 0.4 mg/dL (ref 0.0–1.2)
CO2: 25 mmol/L (ref 20–29)
Calcium: 9.7 mg/dL (ref 8.7–10.2)
Chloride: 101 mmol/L (ref 96–106)
Creatinine, Ser: 0.94 mg/dL (ref 0.76–1.27)
Globulin, Total: 2.9 g/dL (ref 1.5–4.5)
Glucose: 165 mg/dL — ABNORMAL HIGH (ref 70–99)
Potassium: 3.9 mmol/L (ref 3.5–5.2)
Sodium: 141 mmol/L (ref 134–144)
Total Protein: 7.7 g/dL (ref 6.0–8.5)
eGFR: 93 mL/min/{1.73_m2} (ref >59)

## 2021-03-09 LAB — TSH: TSH: 1.23 u[IU]/mL (ref 0.450–4.500)

## 2021-03-12 ENCOUNTER — Other Ambulatory Visit: Payer: Self-pay

## 2021-03-12 ENCOUNTER — Encounter: Payer: Self-pay | Admitting: Family Medicine

## 2021-03-12 ENCOUNTER — Encounter: Payer: Self-pay | Admitting: Gastroenterology

## 2021-03-12 ENCOUNTER — Ambulatory Visit (INDEPENDENT_AMBULATORY_CARE_PROVIDER_SITE_OTHER): Payer: Commercial Managed Care - HMO | Admitting: Family Medicine

## 2021-03-12 VITALS — BP 131/78 | HR 80 | Temp 98.2°F | Wt 210.0 lb

## 2021-03-12 DIAGNOSIS — Z860100 Personal history of colon polyps, unspecified: Secondary | ICD-10-CM

## 2021-03-12 DIAGNOSIS — L989 Disorder of the skin and subcutaneous tissue, unspecified: Secondary | ICD-10-CM | POA: Diagnosis not present

## 2021-03-12 MED ORDER — PEG 3350-KCL-NA BICARB-NACL 420 G PO SOLR
4000.0000 mL | Freq: Once | ORAL | 0 refills | Status: AC
Start: 2021-03-12 — End: 2021-03-12

## 2021-03-12 NOTE — Progress Notes (Signed)
BP 131/78    Pulse 80    Temp 98.2 F (36.8 C)    Wt 210 lb (95.3 kg)    SpO2 98%    BMI 30.13 kg/m    Subjective:    Patient ID: Kristopher [name], male    DOB: [birth year], 60 y.o.   MRN: [id number]  HPI: MARKCUS LAZENBY is a 61 y.o. male  Chief Complaint  Patient presents with   mole removal   SKIN LESION Duration:  chronic Location: R upper back Painful: no Itching: yes Onset: gradual Context:  getting caught on his shirt Associated signs and symptoms: bleeding History of skin cancer: no  Relevant past medical, surgical, family and social history reviewed and updated as indicated. Interim medical history since our last visit reviewed. Allergies and medications reviewed and updated.  Review of Systems  Constitutional: Negative.   Respiratory: Negative.    Cardiovascular: Negative.   Gastrointestinal: Negative.   Musculoskeletal: Negative.   Neurological: Negative.   Psychiatric/Behavioral: Negative.     Per HPI unless specifically indicated above     Objective:    BP 131/78    Pulse 80    Temp 98.2 F (36.8 C)    Wt 210 lb (95.3 kg)    SpO2 98%    BMI 30.13 kg/m   Wt Readings from Last 3 Encounters:  03/12/21 210 lb (95.3 kg)  03/12/21 210 lb (95.3 kg)  03/08/21 210 lb (95.3 kg)    Physical Exam Vitals and nursing note reviewed.  Constitutional:      General: He is not in acute distress.    Appearance: Normal appearance. He is not ill-appearing, toxic-appearing or diaphoretic.  HENT:     Head: Normocephalic and atraumatic.     Right Ear: External ear normal.     Left Ear: External ear normal.     Nose: Nose normal.     Mouth/Throat:     Mouth: Mucous membranes are moist.     Pharynx: Oropharynx is clear.  Eyes:     General: No scleral icterus.       Right eye: No discharge.        Left eye: No discharge.     Extraocular Movements: Extraocular movements intact.     Conjunctiva/sclera: Conjunctivae normal.     Pupils: Pupils are equal, round,  and reactive to light.  Cardiovascular:     Rate and Rhythm: Normal rate and regular rhythm.     Pulses: Normal pulses.     Heart sounds: Normal heart sounds. No murmur heard.   No friction rub. No gallop.  Pulmonary:     Effort: Pulmonary effort is normal. No respiratory distress.     Breath sounds: Normal breath sounds. No stridor. No wheezing, rhonchi or rales.  Chest:     Chest wall: No tenderness.  Musculoskeletal:        General: Normal range of motion.     Cervical back: Normal range of motion and neck supple.  Skin:    General: Skin is warm and dry.     Capillary Refill: Capillary refill takes less than 2 seconds.     Coloration: Skin is not jaundiced or pale.     Findings: No bruising, erythema, lesion or rash.     Comments: 1.5cm pilliated mole on back  Neurological:     General: No focal deficit present.     Mental Status: He is alert and oriented to person, place, and time. Mental status is  at baseline.  Psychiatric:        Mood and Affect: Mood normal.        Behavior: Behavior normal.        Thought Content: Thought content normal.        Judgment: Judgment normal.    Results for orders placed or performed in visit on 03/08/21  Comprehensive metabolic panel  Result Value Ref Range   Glucose 165 (H) 70 - 99 mg/dL   BUN 7 6 - 24 mg/dL   Creatinine, Ser 0.94 0.76 - 1.27 mg/dL   eGFR 93 >59 mL/min/1.73   BUN/Creatinine Ratio 7 (L) 9 - 20   Sodium 141 134 - 144 mmol/L   Potassium 3.9 3.5 - 5.2 mmol/L   Chloride 101 96 - 106 mmol/L   CO2 25 20 - 29 mmol/L   Calcium 9.7 8.7 - 10.2 mg/dL   Total Protein 7.7 6.0 - 8.5 g/dL   Albumin 4.8 3.8 - 4.9 g/dL   Globulin, Total 2.9 1.5 - 4.5 g/dL   Albumin/Globulin Ratio 1.7 1.2 - 2.2   Bilirubin Total 0.4 0.0 - 1.2 mg/dL   Alkaline Phosphatase 155 (H) 44 - 121 IU/L   AST 51 (H) 0 - 40 IU/L   ALT 66 (H) 0 - 44 IU/L  CBC with Differential/Platelet  Result Value Ref Range   WBC 5.9 3.4 - 10.8 x10E3/uL   RBC 5.19 4.14 -  5.80 x10E6/uL   Hemoglobin 15.3 13.0 - 17.7 g/dL   Hematocrit 46.2 37.5 - 51.0 %   MCV 89 79 - 97 fL   MCH 29.5 26.6 - 33.0 pg   MCHC 33.1 31.5 - 35.7 g/dL   RDW 13.3 11.6 - 15.4 %   Platelets 201 150 - 450 x10E3/uL   Neutrophils 62 Not Estab. %   Lymphs 26 Not Estab. %   Monocytes 8 Not Estab. %   Eos 2 Not Estab. %   Basos 1 Not Estab. %   Neutrophils Absolute 3.7 1.4 - 7.0 x10E3/uL   Lymphocytes Absolute 1.5 0.7 - 3.1 x10E3/uL   Monocytes Absolute 0.5 0.1 - 0.9 x10E3/uL   EOS (ABSOLUTE) 0.1 0.0 - 0.4 x10E3/uL   Basophils Absolute 0.1 0.0 - 0.2 x10E3/uL   Immature Granulocytes 1 Not Estab. %   Immature Grans (Abs) 0.1 0.0 - 0.1 x10E3/uL  Bayer DCA Hb A1c Waived  Result Value Ref Range   HB A1C (BAYER DCA - WAIVED) 6.8 (H) 4.8 - 5.6 %  Lipid Panel w/o Chol/HDL Ratio  Result Value Ref Range   Cholesterol, Total 206 (H) 100 - 199 mg/dL   Triglycerides 257 (H) 0 - 149 mg/dL   HDL 32 (L) >39 mg/dL   VLDL Cholesterol Cal 46 (H) 5 - 40 mg/dL   LDL Chol Calc (NIH) 128 (H) 0 - 99 mg/dL  Microalbumin, Urine Waived  Result Value Ref Range   Microalb, Ur Waived 10 0 - 19 mg/L   Creatinine, Urine Waived 50 10 - 300 mg/dL   Microalb/Creat Ratio 30-300 (H) <30 mg/g  TSH  Result Value Ref Range   TSH 1.230 0.450 - 4.500 uIU/mL  Urinalysis, Routine w reflex microscopic  Result Value Ref Range   Specific Gravity, UA 1.010 1.005 - 1.030   pH, UA 6.0 5.0 - 7.5   Color, UA Yellow Yellow   Appearance Ur Clear Clear   Leukocytes,UA Negative Negative   Protein,UA Negative Negative/Trace   Glucose, UA 3+ (A) Negative   Ketones, UA  Negative Negative   RBC, UA Negative Negative   Bilirubin, UA Negative Negative   Urobilinogen, Ur 0.2 0.2 - 1.0 mg/dL   Nitrite, UA Negative Negative      Assessment & Plan:   Problem List Items Addressed This Visit   None Visit Diagnoses     Skin lesion of back    -  Primary   Benign mole, but irritated with his shirt. Removed today as below.        Skin Procedure  Procedure: Informed consent given.  Semi-sterile prep of the area.  Area anesthetized with freezing spray due to lidocaine allergy.  Using a surgical blade, part of the upper dermis shaved off.  Area cauterized. Pt ed on scarring. Diagnosis:   ICD-10-CM   1. Skin lesion of back  L98.9    Benign mole, but irritated with his shirt. Removed today as below.      Lesion Location/Size: 1.5cm lesion in R upper back Physician: MJ Consent:  Risks, benefits, and alternative treatments discussed and all questions were answered.  Patient elected to proceed and verbal consent obtained.  Description: Area prepped and draped using semi-sterile technique. Area locally anesthetized using Freezing spray due to lidocaine allergy. Shave biopsy of lesion performed using a dermablade.  Adequate hemostastis achieved using Silver Nitrate. Wound dressed after application of bacitracin ointment.  Post Procedure Instructions: Wound care instructions discussed and patient was instructed to keep area clean and dry.  Signs and symptoms of infection discussed, patient agrees to contact the office ASAP should they occur.    Follow up plan: Return if symptoms worsen or fail to improve.

## 2021-03-12 NOTE — Progress Notes (Signed)
Gastroenterology Pre-Procedure Review  Request Date: 03/16/2021 Requesting Physician: Dr. Allen Norris  PATIENT REVIEW QUESTIONS: The patient responded to the following health history questions as indicated:  3 year Recall  1. Are you having any GI issues? no 2. Do you have a personal history of Polyps? yes (01/2018 polyps removed.) 3. Do you have a family history of Colon Cancer or Polyps? no 4. Diabetes Mellitus? yes (Type II) 5. Joint replacements in the past 12 months?no 6. Major health problems in the past 3 months?yes (Squamous cell carcinoma Left lung.) 7. Any artificial heart valves, MVP, or defibrillator?no    MEDICATIONS & ALLERGIES:    Patient reports the following regarding taking any anticoagulation/antiplatelet therapy:   Plavix, Coumadin, Eliquis, Xarelto, Lovenox, Pradaxa, Brilinta, or Effient? no Aspirin? no  Patient confirms/reports the following medications:  Current Outpatient Medications  Medication Sig Dispense Refill   Accu-Chek FastClix Lancets MISC USE TO TEST BLOOD SUGAR 2X A DAY 100 each 12   amLODipine (NORVASC) 5 MG tablet TAKE 1 TABLET(5 MG) BY MOUTH TWICE DAILY 180 tablet 1   atorvastatin (LIPITOR) 10 MG tablet Take 1 tablet (10 mg total) by mouth daily at 6 PM. 90 tablet 1   BREZTRI AEROSPHERE 160-9-4.8 MCG/ACT AERO Inhale 2 puffs into the lungs 2 (two) times daily. Maintenance per pulmonology     diphenhydrAMINE (BENADRYL) 25 MG tablet Take 50 mg by mouth daily.     esomeprazole (NEXIUM) 40 MG capsule Take 1 capsule (40 mg total) by mouth daily. 90 capsule 3   glucose blood (ACCU-CHEK GUIDE) test strip USE TO TEST BLOOD SUGAR 2X A DAY 100 strip 3   hydrALAZINE (APRESOLINE) 100 MG tablet TAKE 1 TABLET(100 MG) BY MOUTH TWICE DAILY 180 tablet 1   JARDIANCE 25 MG TABS tablet TAKE 1 TABLET(25 MG) BY MOUTH DAILY BEFORE BREAKFAST 90 tablet 0   lidocaine (LIDODERM) 5 % Place 1 patch onto the skin daily. Remove & Discard patch within 12 hours or as directed by MD 30  patch 12   metFORMIN (GLUCOPHAGE) 500 MG tablet Take 2 tablets (1,000 mg total) by mouth 2 (two) times daily with a meal. 360 tablet 1   metoprolol succinate (TOPROL-XL) 100 MG 24 hr tablet Take 1 tablet (100 mg total) by mouth 2 (two) times daily. Take with or immediately following a meal. To be taken with the 50mg  metoprolol tartrate 180 tablet 1   metoprolol tartrate (LOPRESSOR) 50 MG tablet Take 1 tablet (50 mg total) by mouth 2 (two) times daily. To be taken with the 100mg  metorprolol succinate. 180 tablet 1   montelukast (SINGULAIR) 10 MG tablet Take 10 mg by mouth daily.     morphine (MSIR) 15 MG tablet Take 1 tablet (15 mg total) by mouth every 6 (six) hours as needed for moderate pain or severe pain. Must last 30 days. 120 tablet 0   [START ON 03/20/2021] morphine (MSIR) 15 MG tablet Take 1 tablet (15 mg total) by mouth every 6 (six) hours as needed for moderate pain or severe pain. Must last 30 days. 120 tablet 0   [START ON 04/19/2021] morphine (MSIR) 15 MG tablet Take 1 tablet (15 mg total) by mouth every 6 (six) hours as needed for moderate pain or severe pain. Must last 30 days. 120 tablet 0   Multiple Vitamin (MULTIVITAMIN WITH MINERALS) TABS tablet Take 1 tablet by mouth daily.     NONFORMULARY OR COMPOUNDED ITEM Apply 1-2 mLs topically 4 (four) times daily as needed. 10% Ketamine/2%  Cyclobenzaprine/6% Gabapentin Cream 240 each PRN   nortriptyline (PAMELOR) 25 MG capsule Take 2 capsules (50 mg total) by mouth at bedtime. 180 capsule 1   Potassium Chloride ER 20 MEQ TBCR Take 1 tablet by mouth daily. 90 tablet 1   STIOLTO RESPIMAT 2.5-2.5 MCG/ACT AERS Inhale 2 puffs into the lungs 4 (four) times daily as needed. Prn per pulmonology notes     SUMAtriptan (IMITREX) 50 MG tablet Take 1 tablet (50 mg total) by mouth every 2 (two) hours as needed for migraine. Take 1 tab at onset of migraine. May repeat in 2 hours if headache persists or recurs. 10 tablet 12   No current facility-administered  medications for this visit.   Facility-Administered Medications Ordered in Other Visits  Medication Dose Route Frequency Provider Last Rate Last Admin   0.9 %  sodium chloride infusion    Continuous PRN Dionne Bucy, CRNA   New Bag at 01/22/19 0705   sodium chloride flush (NS) 0.9 % injection 10 mL  10 mL Intracatheter PRN [name], MD   10 mL at 03/01/20 5597    Patient confirms/reports the following allergies:  Allergies  Allergen Reactions   Acetaminophen Swelling   Aspirin Anaphylaxis   Epinephrine Anaphylaxis    Does not include albuterol   Novocain [Procaine] Anaphylaxis   Penicillins Anaphylaxis    Has patient had a PCN reaction causing immediate rash, facial/tongue/throat swelling, SOB or lightheadedness with hypotension: Yes Has patient had a PCN reaction causing severe rash involving mucus membranes or skin necrosis: No Has patient had a PCN reaction that required hospitalization: Yes Has patient had a PCN reaction occurring within the last 10 years: No If all of the above answers are "NO", then may proceed with Cephalosporin use.    Strawberry Extract Anaphylaxis   Shellfish Allergy Hives and Nausea And Vomiting    No orders of the defined types were placed in this encounter.   AUTHORIZATION INFORMATION Primary Insurance: 1D#: Group #:  Secondary Insurance: 1D#: Group #:  SCHEDULE INFORMATION: Date: 010/13/2023 Time: Location: [place]

## 2021-03-15 ENCOUNTER — Other Ambulatory Visit: Payer: Self-pay | Admitting: Family Medicine

## 2021-03-15 NOTE — Telephone Encounter (Signed)
Refilled 03/08/2021 #90 with 1 refill to [facility] #[id number]. Requested Prescriptions  Pending Prescriptions Disp Refills   Potassium Chloride ER 20 MEQ TBCR [Pharmacy Med Name: POTASSIUM CHLORIDE 20MEQ ER TABLETS] 90 tablet 1    Sig: TAKE 1 TABLET BY MOUTH DAILY     Endocrinology:  Minerals - Potassium Supplementation Passed - 03/15/2021  3:38 AM      Passed - K in normal range and within 360 days    Potassium  Date Value Ref Range Status  03/08/2021 3.9 3.5 - 5.2 mmol/L Final         Passed - Cr in normal range and within 360 days    Creatinine, Ser  Date Value Ref Range Status  03/08/2021 0.94 0.76 - 1.27 mg/dL Final         Passed - Valid encounter within last 12 months    Recent Outpatient Visits          3 days ago Skin lesion of back   [place], [name], DO   1 week ago Type 2 diabetes mellitus with stage 1 chronic kidney disease, without long-term current use of insulin ([place])   [place], [name], DO   6 months ago Essential hypertension   [place], [name], DO   9 months ago Type 2 diabetes mellitus with stage 1 chronic kidney disease, without long-term current use of insulin ([place])   [place], [name], DO   10 months ago Viral upper respiratory tract infection   [facility] Jon Billings, NP      Future Appointments            In 5 months [name]

## 2021-03-16 ENCOUNTER — Other Ambulatory Visit: Payer: Self-pay

## 2021-03-16 ENCOUNTER — Encounter: Payer: Self-pay | Admitting: Gastroenterology

## 2021-03-16 ENCOUNTER — Ambulatory Visit (INDEPENDENT_AMBULATORY_CARE_PROVIDER_SITE_OTHER): Payer: Commercial Managed Care - HMO

## 2021-03-16 ENCOUNTER — Ambulatory Visit: Payer: Medicare Other | Admitting: Anesthesiology

## 2021-03-16 ENCOUNTER — Ambulatory Visit
Admission: RE | Admit: 2021-03-16 | Discharge: 2021-03-16 | Disposition: A | Discharge status: Home / Self Care | Payer: Medicare Other | Source: Ambulatory Visit | Attending: Gastroenterology | Admitting: Gastroenterology

## 2021-03-16 ENCOUNTER — Telehealth: Payer: Medicare Other

## 2021-03-16 ENCOUNTER — Encounter: Payer: Self-pay | Admitting: Oncology

## 2021-03-16 ENCOUNTER — Encounter
Admission: RE | Disposition: A | Discharge status: Home / Self Care | Payer: Self-pay | Source: Ambulatory Visit | Attending: Gastroenterology

## 2021-03-16 DIAGNOSIS — Z1211 Encounter for screening for malignant neoplasm of colon: Secondary | ICD-10-CM | POA: Diagnosis not present

## 2021-03-16 DIAGNOSIS — Z7984 Long term (current) use of oral hypoglycemic drugs: Secondary | ICD-10-CM | POA: Insufficient documentation

## 2021-03-16 DIAGNOSIS — L989 Disorder of the skin and subcutaneous tissue, unspecified: Secondary | ICD-10-CM

## 2021-03-16 DIAGNOSIS — K219 Gastro-esophageal reflux disease without esophagitis: Secondary | ICD-10-CM | POA: Insufficient documentation

## 2021-03-16 DIAGNOSIS — G4730 Sleep apnea, unspecified: Secondary | ICD-10-CM | POA: Insufficient documentation

## 2021-03-16 DIAGNOSIS — E1122 Type 2 diabetes mellitus with diabetic chronic kidney disease: Secondary | ICD-10-CM | POA: Insufficient documentation

## 2021-03-16 DIAGNOSIS — J449 Chronic obstructive pulmonary disease, unspecified: Secondary | ICD-10-CM | POA: Insufficient documentation

## 2021-03-16 DIAGNOSIS — Z87891 Personal history of nicotine dependence: Secondary | ICD-10-CM | POA: Insufficient documentation

## 2021-03-16 DIAGNOSIS — E119 Type 2 diabetes mellitus without complications: Secondary | ICD-10-CM | POA: Diagnosis not present

## 2021-03-16 DIAGNOSIS — Z86010 Personal history of colonic polyps: Secondary | ICD-10-CM

## 2021-03-16 DIAGNOSIS — M1990 Unspecified osteoarthritis, unspecified site: Secondary | ICD-10-CM | POA: Insufficient documentation

## 2021-03-16 DIAGNOSIS — Z79899 Other long term (current) drug therapy: Secondary | ICD-10-CM | POA: Insufficient documentation

## 2021-03-16 DIAGNOSIS — I129 Hypertensive chronic kidney disease with stage 1 through stage 4 chronic kidney disease, or unspecified chronic kidney disease: Secondary | ICD-10-CM | POA: Insufficient documentation

## 2021-03-16 DIAGNOSIS — C3492 Malignant neoplasm of unspecified part of left bronchus or lung: Secondary | ICD-10-CM

## 2021-03-16 DIAGNOSIS — C3490 Malignant neoplasm of unspecified part of unspecified bronchus or lung: Secondary | ICD-10-CM

## 2021-03-16 DIAGNOSIS — I10 Essential (primary) hypertension: Secondary | ICD-10-CM

## 2021-03-16 HISTORY — PX: COLONOSCOPY WITH PROPOFOL: SHX5780

## 2021-03-16 LAB — GLUCOSE, CAPILLARY
Glucose-Capillary: 164 mg/dL — ABNORMAL HIGH (ref 70–99)
Glucose-Capillary: 139 mg/dL — ABNORMAL HIGH (ref 70–99)

## 2021-03-16 SURGERY — COLONOSCOPY WITH PROPOFOL
Anesthesia: General | Site: Rectum

## 2021-03-16 MED ORDER — STERILE WATER FOR IRRIGATION IR SOLN
Status: DC | PRN
  Administered 2021-03-16: 1

## 2021-03-16 MED ORDER — PROPOFOL 10 MG/ML IV BOLUS
INTRAVENOUS | Status: DC | PRN
  Administered 2021-03-16 (×2): 70 mg via INTRAVENOUS

## 2021-03-16 MED ORDER — SODIUM CHLORIDE 0.9 % IV SOLN: INTRAVENOUS | Status: DC

## 2021-03-16 MED ORDER — LACTATED RINGERS IV SOLN: INTRAVENOUS | Status: DC

## 2021-03-16 SURGICAL SUPPLY — 22 items
CLIP HMST 235XBRD CATH ROT (MISCELLANEOUS) IMPLANT
CLIP RESOLUTION 360 11X235 (MISCELLANEOUS)
ELECT REM PT RETURN 9FT ADLT (ELECTROSURGICAL)
ELECTRODE REM PT RTRN 9FT ADLT (ELECTROSURGICAL) IMPLANT
FORCEPS BIOP RAD 4 LRG CAP 4 (CUTTING FORCEPS) IMPLANT
GOWN CVR UNV OPN BCK APRN NK (MISCELLANEOUS) ×2 IMPLANT
GOWN ISOL THUMB LOOP REG UNIV (MISCELLANEOUS) ×4
INJECTOR VARIJECT VIN23 (MISCELLANEOUS) IMPLANT
KIT DEFENDO VALVE AND CONN (KITS) IMPLANT
KIT PRC NS LF DISP ENDO (KITS) ×1 IMPLANT
KIT PROCEDURE OLYMPUS (KITS) ×2
MANIFOLD NEPTUNE II (INSTRUMENTS) ×2 IMPLANT
MARKER SPOT ENDO TATTOO 5ML (MISCELLANEOUS) IMPLANT
PROBE APC STR FIRE (PROBE) IMPLANT
RETRIEVER NET ROTH 2.5X230 LF (MISCELLANEOUS) IMPLANT
SNARE COLD EXACTO (MISCELLANEOUS) IMPLANT
SNARE SHORT THROW 13M SML OVAL (MISCELLANEOUS) IMPLANT
SNARE SNG USE RND 15MM (INSTRUMENTS) IMPLANT
SPOT EX ENDOSCOPIC TATTOO (MISCELLANEOUS)
TRAP ETRAP POLY (MISCELLANEOUS) IMPLANT
VARIJECT INJECTOR VIN23 (MISCELLANEOUS)
WATER STERILE IRR 250ML POUR (IV SOLUTION) ×2 IMPLANT

## 2021-03-16 NOTE — Op Note (Signed)
[facility] Gastroenterology Patient Name: Kristopher [name] Procedure Date: 03/16/2021 7:30 AM MRN: [id number] Account #: [id number] Date of Birth: 12/17/1960 Admit Type: Outpatient Age: 61 Room: [facility] OR ROOM 01 Gender: Male Note Status: Finalized Instrument Name: [id number] Procedure:             Colonoscopy Indications:           High risk colon cancer surveillance: Personal history                         of colonic polyps Providers:             Lucilla Lame MD, MD Referring MD:          [name] (Referring MD) Medicines:             Propofol per Anesthesia Complications:         No immediate complications. Procedure:             Pre-Anesthesia Assessment:                        - Prior to the procedure, a History and Physical was                         performed, and patient medications and allergies were                         reviewed. The patient's tolerance of previous                         anesthesia was also reviewed. The risks and benefits                         of the procedure and the sedation options and risks                         were discussed with the patient. All questions were                         answered, and informed consent was obtained. Prior                         Anticoagulants: The patient has taken no previous                         anticoagulant or antiplatelet agents. ASA Grade                         Assessment: II - A patient with mild systemic disease.                         After reviewing the risks and benefits, the patient                         was deemed in satisfactory condition to undergo the                         procedure.  After obtaining informed consent, the colonoscope was                         passed under direct vision. Throughout the procedure,                         the patient's blood pressure, pulse, and oxygen                         saturations were monitored  continuously. The                         Colonoscope was introduced through the anus with the                         intention of advancing to the cecum. The scope was                         advanced to the ascending colon before the procedure                         was aborted. Medications were given. The colonoscopy                         was performed without difficulty. The patient                         tolerated the procedure well. The quality of the bowel                         preparation was not adequate to identify polyps 6 mm                         and larger in size. Findings:      The perianal and digital rectal examinations were normal.      A large amount of stool was found in the entire colon, precluding       visualization. Impression:            - Preparation of the colon was inadequate.                        - Stool in the entire examined colon.                        - No specimens collected. Recommendation:        - Discharge patient to home.                        - Resume previous diet.                        - Continue present medications.                        - Repeat colonoscopy because the bowel preparation was                         poor. Procedure Code(s):     --- Professional ---  22583, 53, Colonoscopy, flexible; diagnostic,                         including collection of specimen(s) by brushing or                         washing, when performed (separate procedure) Diagnosis Code(s):     --- Professional ---                        Z86.010, Personal history of colonic polyps CPT copyright 2019 American Medical Association. All rights reserved. The codes documented in this report are preliminary and upon coder review may  be revised to meet current compliance requirements. Lucilla Lame MD, MD 03/16/2021 7:54:48 AM This report has been signed electronically. Number of Addenda: 0 Note Initiated On: 03/16/2021 7:30 AM Total  Procedure Duration: 0 hours 4 minutes 6 seconds  Estimated Blood Loss:  Estimated blood loss: none.      [facility]

## 2021-03-16 NOTE — H&P (Signed)
Lucilla Lame, MD [address] [place], [place] [address] [phone] Fax : [phone]  Primary Care Physician:  [name], DO Primary Gastroenterologist:  Dr. Allen Norris  Pre-Procedure History & Physical: HPI:  Kristopher [name] is a 61 y.o. male is here for an colonoscopy.   Past Medical History:  Diagnosis Date   Allergy    Arthritis    left foot   Benign hypertensive kidney disease    Chronic back pain    Four rods in back   Diabetes mellitus, type 2 (HCC)    Dyspnea    GERD (gastroesophageal reflux disease)    Hypertension    Malignant neoplasm of lung (HCC)    Migraines    daily    Past Surgical History:  Procedure Laterality Date   APPENDECTOMY     BACK SURGERY     COLONOSCOPY WITH PROPOFOL N/A 02/19/2016   Procedure: COLONOSCOPY WITH PROPOFOL;  Surgeon: Lucilla Lame, MD;  Location: [place];  Service: Endoscopy;  Laterality: N/A;   COLONOSCOPY WITH PROPOFOL N/A 01/19/2018   Procedure: COLONOSCOPY WITH PROPOFOL;  Surgeon: Lucilla Lame, MD;  Location: [place];  Service: Endoscopy;  Laterality: N/A;  Diabetic - oral meds   DG OPERATIVE LEFT HIP ([place] HX)     10/19   ELECTROMAGNETIC NAVIGATION BROCHOSCOPY Left 11/18/2018   Procedure: ELECTROMAGNETIC NAVIGATION BRONCHOSCOPY;  Surgeon: Tyler Pita, MD;  Location: [facility];  Service: Cardiopulmonary;  Laterality: Left;   FLEXIBLE BRONCHOSCOPY Bilateral 01/20/2019   Procedure: FLEXIBLE BRONCHOSCOPY;  Surgeon: Ottie Glazier, MD;  Location: [facility];  Service: Thoracic;  Laterality: Bilateral;   FLEXIBLE BRONCHOSCOPY Bilateral 01/22/2019   Procedure: FLEXIBLE BRONCHOSCOPY;  Surgeon: Ottie Glazier, MD;  Location: [facility];  Service: Thoracic;  Laterality: Bilateral;   FOOT SURGERY Left    Screws and plates   JOINT REPLACEMENT Left 12/2017   DR Rudene Christians Hip   KNEE SURGERY Left    X 2   LEG SURGERY     LUNG CANCER SURGERY     POLYPECTOMY N/A 02/19/2016   Procedure:  POLYPECTOMY;  Surgeon: Lucilla Lame, MD;  Location: [place];  Service: Endoscopy;  Laterality: N/A;   POLYPECTOMY  01/19/2018   Procedure: POLYPECTOMY;  Surgeon: Lucilla Lame, MD;  Location: [place];  Service: Endoscopy;;   PORTACATH PLACEMENT N/A 11/11/2019   Procedure: INSERTION PORT-A-CATH;  Surgeon: Nestor Lewandowsky, MD;  Location: [place] [facility];  Service: General;  Laterality: N/A;   THORACOTOMY Left 01/14/2019   Procedure: THORACOTOMY MAJOR, LEFT;  Surgeon: Nestor Lewandowsky, MD;  Location: [facility];  Service: General;  Laterality: Left;   TOTAL HIP ARTHROPLASTY Left 12/02/2017   Procedure: TOTAL HIP ARTHROPLASTY ANTERIOR APPROACH;  Surgeon: Hessie Knows, MD;  Location: [facility];  Service: Orthopedics;  Laterality: Left;   VIDEO BRONCHOSCOPY Left 01/14/2019   Procedure: VIDEO BRONCHOSCOPY WITH FLUORO, LEFT;  Surgeon: Nestor Lewandowsky, MD;  Location: [facility];  Service: General;  Laterality: Left;   VIDEO BRONCHOSCOPY WITH ENDOBRONCHIAL NAVIGATION N/A 10/15/2019   Procedure: VIDEO BRONCHOSCOPY WITH ENDOBRONCHIAL NAVIGATION;  Surgeon: Ottie Glazier, MD;  Location: [facility];  Service: Thoracic;  Laterality: N/A;   VIDEO BRONCHOSCOPY WITH ENDOBRONCHIAL ULTRASOUND N/A 10/15/2019   Procedure: VIDEO BRONCHOSCOPY WITH ENDOBRONCHIAL ULTRASOUND;  Surgeon: Ottie Glazier, MD;  Location: [facility];  Service: Thoracic;  Laterality: N/A;    Prior to Admission medications   Medication Sig Start Date End Date Taking? Authorizing Provider  amLODipine (NORVASC) 5 MG tablet TAKE 1  TABLET(5 MG) BY MOUTH TWICE DAILY 03/08/21  Yes Johnson, [name], DO  °atorvastatin (LIPITOR) 10 MG tablet Take 1 tablet (10 mg total) by mouth daily at 6 PM. 03/08/21  Yes Johnson, [name], DO  °BREZTRI AEROSPHERE 160-9-4.8 MCG/ACT AERO Inhale 2 puffs into the lungs 2 (two) times daily. Maintenance per pulmonology 10/22/19  Yes [provider]  °diphenhydrAMINE (BENADRYL) 25 MG tablet Take 50 mg by mouth daily.   Yes  [provider]  °esomeprazole (NEXIUM) 40 MG capsule Take 1 capsule (40 mg total) by mouth daily. 09/01/20  Yes Johnson, [name], DO  °hydrALAZINE (APRESOLINE) 100 MG tablet TAKE 1 TABLET(100 MG) BY MOUTH TWICE DAILY 03/08/21  Yes Johnson, [name], DO  °JARDIANCE 25 MG TABS tablet TAKE 1 TABLET(25 MG) BY MOUTH DAILY BEFORE BREAKFAST 12/21/20  Yes Johnson, [name], DO  °metFORMIN (GLUCOPHAGE) 500 MG tablet Take 2 tablets (1,000 mg total) by mouth 2 (two) times daily with a meal. 03/08/21  Yes Johnson, [name], DO  °metoprolol succinate (TOPROL-XL) 100 MG 24 hr tablet Take 1 tablet (100 mg total) by mouth 2 (two) times daily. Take with or immediately following a meal. To be taken with the 50mg metoprolol tartrate 03/08/21  Yes Johnson, [name], DO  °metoprolol tartrate (LOPRESSOR) 50 MG tablet Take 1 tablet (50 mg total) by mouth 2 (two) times daily. To be taken with the 100mg metorprolol succinate. 03/08/21  Yes Johnson, [name], DO  °montelukast (SINGULAIR) 10 MG tablet Take 10 mg by mouth daily. 02/09/19  Yes [provider]  °morphine (MSIR) 15 MG tablet Take 1 tablet (15 mg total) by mouth every 6 (six) hours as needed for moderate pain or severe pain. Must last 30 days. 02/18/21 03/20/21 Yes Naveira, Francisco, MD  °Multiple Vitamin (MULTIVITAMIN WITH MINERALS) TABS tablet Take 1 tablet by mouth daily.   Yes [provider]  °NONFORMULARY OR COMPOUNDED ITEM Apply 1-2 mLs topically 4 (four) times daily as needed. 10% Ketamine/2% Cyclobenzaprine/6% Gabapentin Cream 11/20/20 11/20/21 Yes Naveira, Francisco, MD  °nortriptyline (PAMELOR) 25 MG capsule Take 2 capsules (50 mg total) by mouth at bedtime. 03/08/21  Yes Johnson, [name], DO  °Potassium Chloride ER 20 MEQ TBCR Take 1 tablet by mouth daily. 03/08/21  Yes Johnson, [name], DO  °STIOLTO RESPIMAT 2.5-2.5 MCG/ACT AERS Inhale 2 puffs into the lungs 4 (four) times daily as needed. Prn per pulmonology notes 10/22/19  Yes [provider]   °SUMAtriptan (IMITREX) 50 MG tablet Take 1 tablet (50 mg total) by mouth every 2 (two) hours as needed for migraine. Take 1 tab at onset of migraine. May repeat in 2 hours if headache persists or recurs. 09/14/20  Yes Holdsworth, Karen, NP  °Accu-Chek FastClix Lancets MISC USE TO TEST BLOOD SUGAR 2X A DAY 04/26/19   Lane, Rachel Elizabeth, PA-C  °glucose blood (ACCU-CHEK GUIDE) test strip USE TO TEST BLOOD SUGAR 2X A DAY 03/09/20   Johnson, [name], DO  °lidocaine (LIDODERM) 5 % Place 1 patch onto the skin daily. Remove & Discard patch within 12 hours or as directed by MD 09/14/20   Holdsworth, Karen, NP  °morphine (MSIR) 15 MG tablet Take 1 tablet (15 mg total) by mouth every 6 (six) hours as needed for moderate pain or severe pain. Must last 30 days. 03/20/21 04/19/21  Naveira, Francisco, MD  °morphine (MSIR) 15 MG tablet Take 1 tablet (15 mg total) by mouth every 6 (six) hours as needed for moderate pain   or severe pain. Must last 30 days. 04/19/21 05/19/21  [name], MD    Allergies as of 03/12/2021 - Review Complete 03/12/2021  Allergen Reaction Noted   Acetaminophen Swelling 07/23/2019   Aspirin Anaphylaxis 01/22/2016   Epinephrine Anaphylaxis 08/19/2017   Novocain [procaine] Anaphylaxis 02/15/2016   Penicillins Anaphylaxis 11/11/2014   Strawberry extract Anaphylaxis 08/19/2017   Shellfish allergy Hives and Nausea And Vomiting 12/02/2017    Family History  Problem Relation Age of Onset   Cancer Father    Diabetes Sister    Thrombosis Sister     Social History   Socioeconomic History   Marital status: Significant Other    Spouse name: Not on file   Number of children: Not on file   Years of education: Not on file   Highest education level: Not on file  Occupational History   Occupation: disability  Tobacco Use   Smoking status: Former    Packs/day: 0.25    Years: 35.00    Pack years: 8.75    Types: Cigarettes    Quit date: 01/14/2019    Years since quitting: 2.1    Smokeless tobacco: Never  Vaping Use   Vaping Use: Never used  Substance and Sexual Activity   Alcohol use: No    Alcohol/week: 0.0 standard drinks   Drug use: Yes    Types: Morphine    Comment: prescribed   Sexual activity: Yes  Other Topics Concern   Not on file  Social History Narrative   Not on file   Social Determinants of Health   Financial Resource Strain: Medium Risk   Difficulty of Paying Living Expenses: Somewhat hard  Food Insecurity: No Food Insecurity   Worried About Charity fundraiser in the Last Year: Never true   Ran Out of Food in the Last Year: Never true  Transportation Needs: No Transportation Needs   Lack of Transportation (Medical): No   Lack of Transportation (Non-Medical): No  Physical Activity: Inactive   Days of Exercise per Week: 0 days   Minutes of Exercise per Session: 0 min  Stress: No Stress Concern Present   Feeling of Stress : Not at all  Social Connections: Socially Integrated   Frequency of Communication with Friends and Family: More than three times a week   Frequency of Social Gatherings with Friends and Family: More than three times a week   Attends Religious Services: More than 4 times per year   Active Member of [employer] or Organizations: Yes   Attends Music therapist: More than 4 times per year   Marital Status: Married  Human resources officer Violence: Not At Risk   Fear of Current or Ex-Partner: No   Emotionally Abused: No   Physically Abused: No   Sexually Abused: No    Review of Systems: See HPI, otherwise negative ROS  Physical Exam: BP 122/84    Pulse 71    Temp 97.7 F (36.5 C)    Resp 18    Ht _0  (1.778 m)    Wt 103.9 kg    BMI 32.86 kg/m  General:   Alert,  pleasant and cooperative in NAD Head:  Normocephalic and atraumatic. Neck:  Supple; no masses or thyromegaly. Lungs:  Clear throughout to auscultation.    Heart:  Regular rate and rhythm. Abdomen:  Soft, nontender and nondistended. Normal bowel  sounds, without guarding, and without rebound.   Neurologic:  Alert and  oriented x4;  grossly normal neurologically.  Impression/Plan: Kristopher [name] is here for an colonoscopy to be performed for a history of adenomatous polyps on 2019   Risks, benefits, limitations, and alternatives regarding  colonoscopy have been reviewed with the patient.  Questions have been answered.  All parties agreeable.   Lucilla Lame, MD  03/16/2021, 7:20 AM

## 2021-03-16 NOTE — H&P (View-Only) (Signed)
Lucilla Lame, MD [address] [place], [place] [address] Phone:[phone] Fax : [phone]  Primary Care Physician:  [name], DO Primary Gastroenterologist:  Dr. Allen Norris  Pre-Procedure History & Physical: HPI:  Kristopher [name] is a 60 y.o. male is here for an colonoscopy.   Past Medical History:  Diagnosis Date   Allergy    Arthritis    left foot   Benign hypertensive kidney disease    Chronic back pain    Four rods in back   Diabetes mellitus, type 2 (HCC)    Dyspnea    GERD (gastroesophageal reflux disease)    Hypertension    Malignant neoplasm of lung (HCC)    Migraines    daily    Past Surgical History:  Procedure Laterality Date   APPENDECTOMY     BACK SURGERY     COLONOSCOPY WITH PROPOFOL N/A 02/19/2016   Procedure: COLONOSCOPY WITH PROPOFOL;  Surgeon: Lucilla Lame, MD;  Location: [place];  Service: Endoscopy;  Laterality: N/A;   COLONOSCOPY WITH PROPOFOL N/A 01/19/2018   Procedure: COLONOSCOPY WITH PROPOFOL;  Surgeon: Lucilla Lame, MD;  Location: [place];  Service: Endoscopy;  Laterality: N/A;  Diabetic - oral meds   DG OPERATIVE LEFT HIP ([place] HX)     10/19   ELECTROMAGNETIC NAVIGATION BROCHOSCOPY Left 11/18/2018   Procedure: ELECTROMAGNETIC NAVIGATION BRONCHOSCOPY;  Surgeon: Tyler Pita, MD;  Location: [facility];  Service: Cardiopulmonary;  Laterality: Left;   FLEXIBLE BRONCHOSCOPY Bilateral 01/20/2019   Procedure: FLEXIBLE BRONCHOSCOPY;  Surgeon: Ottie Glazier, MD;  Location: [facility];  Service: Thoracic;  Laterality: Bilateral;   FLEXIBLE BRONCHOSCOPY Bilateral 01/22/2019   Procedure: FLEXIBLE BRONCHOSCOPY;  Surgeon: Ottie Glazier, MD;  Location: [facility];  Service: Thoracic;  Laterality: Bilateral;   FOOT SURGERY Left    Screws and plates   JOINT REPLACEMENT Left 12/2017   DR Rudene Christians Hip   KNEE SURGERY Left    X 2   LEG SURGERY     LUNG CANCER SURGERY     POLYPECTOMY N/A 02/19/2016   Procedure:  POLYPECTOMY;  Surgeon: Lucilla Lame, MD;  Location: [place];  Service: Endoscopy;  Laterality: N/A;   POLYPECTOMY  01/19/2018   Procedure: POLYPECTOMY;  Surgeon: Lucilla Lame, MD;  Location: [place];  Service: Endoscopy;;   PORTACATH PLACEMENT N/A 11/11/2019   Procedure: INSERTION PORT-A-CATH;  Surgeon: Nestor Lewandowsky, MD;  Location: [place] [facility];  Service: General;  Laterality: N/A;   THORACOTOMY Left 01/14/2019   Procedure: THORACOTOMY MAJOR, LEFT;  Surgeon: Nestor Lewandowsky, MD;  Location: [facility];  Service: General;  Laterality: Left;   TOTAL HIP ARTHROPLASTY Left 12/02/2017   Procedure: TOTAL HIP ARTHROPLASTY ANTERIOR APPROACH;  Surgeon: Hessie Knows, MD;  Location: [facility];  Service: Orthopedics;  Laterality: Left;   VIDEO BRONCHOSCOPY Left 01/14/2019   Procedure: VIDEO BRONCHOSCOPY WITH FLUORO, LEFT;  Surgeon: Nestor Lewandowsky, MD;  Location: [facility];  Service: General;  Laterality: Left;   VIDEO BRONCHOSCOPY WITH ENDOBRONCHIAL NAVIGATION N/A 10/15/2019   Procedure: VIDEO BRONCHOSCOPY WITH ENDOBRONCHIAL NAVIGATION;  Surgeon: Ottie Glazier, MD;  Location: [facility];  Service: Thoracic;  Laterality: N/A;   VIDEO BRONCHOSCOPY WITH ENDOBRONCHIAL ULTRASOUND N/A 10/15/2019   Procedure: VIDEO BRONCHOSCOPY WITH ENDOBRONCHIAL ULTRASOUND;  Surgeon: Ottie Glazier, MD;  Location: [facility];  Service: Thoracic;  Laterality: N/A;    Prior to Admission medications   Medication Sig Start Date End Date Taking? Authorizing Provider  amLODipine (NORVASC) 5 MG tablet TAKE 1  TABLET(5 MG) BY MOUTH TWICE DAILY 03/08/21  Yes Johnson, [name], DO  atorvastatin (LIPITOR) 10 MG tablet Take 1 tablet (10 mg total) by mouth daily at 6 PM. 03/08/21  Yes Johnson, [name], DO  BREZTRI AEROSPHERE 160-9-4.8 MCG/ACT AERO Inhale 2 puffs into the lungs 2 (two) times daily. Maintenance per pulmonology 10/22/19  Yes [provider]  diphenhydrAMINE (BENADRYL) 25 MG tablet Take 50 mg by mouth daily.   Yes  [provider]  esomeprazole (NEXIUM) 40 MG capsule Take 1 capsule (40 mg total) by mouth daily. 09/01/20  Yes Johnson, [name], DO  hydrALAZINE (APRESOLINE) 100 MG tablet TAKE 1 TABLET(100 MG) BY MOUTH TWICE DAILY 03/08/21  Yes Johnson, [name], DO  JARDIANCE 25 MG TABS tablet TAKE 1 TABLET(25 MG) BY MOUTH DAILY BEFORE BREAKFAST 12/21/20  Yes Johnson, [name], DO  metFORMIN (GLUCOPHAGE) 500 MG tablet Take 2 tablets (1,000 mg total) by mouth 2 (two) times daily with a meal. 03/08/21  Yes Johnson, [name], DO  metoprolol succinate (TOPROL-XL) 100 MG 24 hr tablet Take 1 tablet (100 mg total) by mouth 2 (two) times daily. Take with or immediately following a meal. To be taken with the 65m metoprolol tartrate 03/08/21  Yes Johnson, [name], DO  metoprolol tartrate (LOPRESSOR) 50 MG tablet Take 1 tablet (50 mg total) by mouth 2 (two) times daily. To be taken with the 1070mmetorprolol succinate. 03/08/21  Yes Johnson, [name], DO  montelukast (SINGULAIR) 10 MG tablet Take 10 mg by mouth daily. 02/09/19  Yes [provider]  morphine (MSIR) 15 MG tablet Take 1 tablet (15 mg total) by mouth every 6 (six) hours as needed for moderate pain or severe pain. Must last 30 days. 02/18/21 03/20/21 Yes NaMilinda PointerMD  Multiple Vitamin (MULTIVITAMIN WITH MINERALS) TABS tablet Take 1 tablet by mouth daily.   Yes [provider]  NONFORMULARY OR COMPOUNDED ITEM Apply 1-2 mLs topically 4 (four) times daily as needed. 10% Ketamine/2% Cyclobenzaprine/6% Gabapentin Cream 11/20/20 11/20/21 Yes NaMilinda PointerMD  nortriptyline (PAMELOR) 25 MG capsule Take 2 capsules (50 mg total) by mouth at bedtime. 03/08/21  Yes Johnson, [name], DO  Potassium Chloride ER 20 MEQ TBCR Take 1 tablet by mouth daily. 03/08/21  Yes Johnson, [name], DO  STIOLTO RESPIMAT 2.5-2.5 MCG/ACT AERS Inhale 2 puffs into the lungs 4 (four) times daily as needed. Prn per pulmonology notes 10/22/19  Yes [provider]   SUMAtriptan (IMITREX) 50 MG tablet Take 1 tablet (50 mg total) by mouth every 2 (two) hours as needed for migraine. Take 1 tab at onset of migraine. May repeat in 2 hours if headache persists or recurs. 09/14/20  Yes HoJon BillingsNP  Accu-Chek FastClix Lancets MISC USE TO TEST BLOOD SUGAR 2X A DAY 04/26/19   [place] AmericanPA-C  glucose blood (ACCU-CHEK GUIDE) test strip USE TO TEST BLOOD SUGAR 2X A DAY 03/09/20   Johnson, [name], DO  lidocaine (LIDODERM) 5 % Place 1 patch onto the skin daily. Remove & Discard patch within 12 hours or as directed by MD 09/14/20   HoJon BillingsNP  morphine (MSIR) 15 MG tablet Take 1 tablet (15 mg total) by mouth every 6 (six) hours as needed for moderate pain or severe pain. Must last 30 days. 03/20/21 04/19/21  NaMilinda PointerMD  morphine (MSIR) 15 MG tablet Take 1 tablet (15 mg total) by mouth every 6 (six) hours as needed for moderate pain  or severe pain. Must last 30 days. 04/19/21 05/19/21  [name], MD    Allergies as of 03/12/2021 - Review Complete 03/12/2021  Allergen Reaction Noted   Acetaminophen Swelling 07/23/2019   Aspirin Anaphylaxis 01/22/2016   Epinephrine Anaphylaxis 08/19/2017   Novocain [procaine] Anaphylaxis 02/15/2016   Penicillins Anaphylaxis 11/11/2014   Strawberry extract Anaphylaxis 08/19/2017   Shellfish allergy Hives and Nausea And Vomiting 12/02/2017    Family History  Problem Relation Age of Onset   Cancer Father    Diabetes Sister    Thrombosis Sister     Social History   Socioeconomic History   Marital status: Significant Other    Spouse name: Not on file   Number of children: Not on file   Years of education: Not on file   Highest education level: Not on file  Occupational History   Occupation: disability  Tobacco Use   Smoking status: Former    Packs/day: 0.25    Years: 35.00    Pack years: 8.75    Types: Cigarettes    Quit date: 01/14/2019    Years since quitting: 2.1    Smokeless tobacco: Never  Vaping Use   Vaping Use: Never used  Substance and Sexual Activity   Alcohol use: No    Alcohol/week: 0.0 standard drinks   Drug use: Yes    Types: Morphine    Comment: prescribed   Sexual activity: Yes  Other Topics Concern   Not on file  Social History Narrative   Not on file   Social Determinants of Health   Financial Resource Strain: Medium Risk   Difficulty of Paying Living Expenses: Somewhat hard  Food Insecurity: No Food Insecurity   Worried About Charity fundraiser in the Last Year: Never true   Ran Out of Food in the Last Year: Never true  Transportation Needs: No Transportation Needs   Lack of Transportation (Medical): No   Lack of Transportation (Non-Medical): No  Physical Activity: Inactive   Days of Exercise per Week: 0 days   Minutes of Exercise per Session: 0 min  Stress: No Stress Concern Present   Feeling of Stress : Not at all  Social Connections: Socially Integrated   Frequency of Communication with Friends and Family: More than three times a week   Frequency of Social Gatherings with Friends and Family: More than three times a week   Attends Religious Services: More than 4 times per year   Active Member of [employer] or Organizations: Yes   Attends Music therapist: More than 4 times per year   Marital Status: Married  Human resources officer Violence: Not At Risk   Fear of Current or Ex-Partner: No   Emotionally Abused: No   Physically Abused: No   Sexually Abused: No    Review of Systems: See HPI, otherwise negative ROS  Physical Exam: BP 122/84    Pulse 71    Temp 97.7 F (36.5 C)    Resp 18    Ht _0  (1.778 m)    Wt 103.9 kg    BMI 32.86 kg/m  General:   Alert,  pleasant and cooperative in NAD Head:  Normocephalic and atraumatic. Neck:  Supple; no masses or thyromegaly. Lungs:  Clear throughout to auscultation.    Heart:  Regular rate and rhythm. Abdomen:  Soft, nontender and nondistended. Normal bowel  sounds, without guarding, and without rebound.   Neurologic:  Alert and  oriented x4;  grossly normal neurologically.  Impression/Plan: Kristopher Como  A [name] is here for an colonoscopy to be performed for a history of adenomatous polyps on 2019   Risks, benefits, limitations, and alternatives regarding  colonoscopy have been reviewed with the patient.  Questions have been answered.  All parties agreeable.   Lucilla Lame, MD  03/16/2021, 7:20 AM

## 2021-03-16 NOTE — Anesthesia Postprocedure Evaluation (Signed)
Anesthesia Post Note  Patient: Kristopher [name]  Procedure(s) Performed: COLONOSCOPY WITH PROPOFOL (Rectum)     Patient location during evaluation: PACU Anesthesia Type: General Level of consciousness: awake and alert Pain management: pain level controlled Vital Signs Assessment: post-procedure vital signs reviewed and stable Respiratory status: spontaneous breathing, nonlabored ventilation and respiratory function stable Cardiovascular status: blood pressure returned to baseline and stable Postop Assessment: no apparent nausea or vomiting Anesthetic complications: no   No notable events documented.  Wanda Plump Lesleigh Hughson

## 2021-03-16 NOTE — Transfer of Care (Signed)
Immediate Anesthesia Transfer of Care Note  Patient: Kristopher [name]  Procedure(s) Performed: COLONOSCOPY WITH PROPOFOL (Rectum)  Patient Location: PACU  Anesthesia Type: General  Level of Consciousness: awake, alert  and patient cooperative  Airway and Oxygen Therapy: Patient Spontanous Breathing and Patient connected to supplemental oxygen  Post-op Assessment: Post-op Vital signs reviewed, Patient's Cardiovascular Status Stable, Respiratory Function Stable, Patent Airway and No signs of Nausea or vomiting  Post-op Vital Signs: Reviewed and stable  Complications: No notable events documented.

## 2021-03-16 NOTE — Chronic Care Management (AMB) (Signed)
Chronic Care Management   CCM RN Visit Note  03/16/2021 Name: Kristopher [name] MRN: [id number] DOB: 1961/02/19  Subjective: Kristopher [name] is a 61 y.o. year old male who is a primary care patient of [name], DO. The care management team was consulted for assistance with disease management and care coordination needs.    Engaged with patient by telephone for follow up visit in response to provider referral for case management and/or care coordination services.   Consent to Services:  The patient was given information about Chronic Care Management services, agreed to services, and gave verbal consent prior to initiation of services.  Please see initial visit note for detailed documentation.   Patient agreed to services and verbal consent obtained.   Assessment: Review of patient past medical history, allergies, medications, health status, including review of consultants reports, laboratory and other test data, was performed as part of comprehensive evaluation and provision of chronic care management services.   SDOH (Social Determinants of Health) assessments and interventions performed:    CCM Care Plan  Allergies  Allergen Reactions   Acetaminophen Swelling   Aspirin Anaphylaxis   Epinephrine Anaphylaxis    Does not include albuterol   Novocain [Procaine] Anaphylaxis   Penicillins Anaphylaxis    Has patient had a PCN reaction causing immediate rash, facial/tongue/throat swelling, SOB or lightheadedness with hypotension: Yes Has patient had a PCN reaction causing severe rash involving mucus membranes or skin necrosis: No Has patient had a PCN reaction that required hospitalization: Yes Has patient had a PCN reaction occurring within the last 10 years: No If all of the above answers are "NO", then may proceed with Cephalosporin use.    Strawberry Extract Anaphylaxis   Shellfish Allergy Hives and Nausea And Vomiting    Outpatient Encounter Medications as of 03/16/2021   Medication Sig Note   Accu-Chek FastClix Lancets MISC USE TO TEST BLOOD SUGAR 2X A DAY    amLODipine (NORVASC) 5 MG tablet TAKE 1 TABLET(5 MG) BY MOUTH TWICE DAILY    atorvastatin (LIPITOR) 10 MG tablet Take 1 tablet (10 mg total) by mouth daily at 6 PM.    BREZTRI AEROSPHERE 160-9-4.8 MCG/ACT AERO Inhale 2 puffs into the lungs 2 (two) times daily. Maintenance per pulmonology    diphenhydrAMINE (BENADRYL) 25 MG tablet Take 50 mg by mouth daily.    esomeprazole (NEXIUM) 40 MG capsule Take 1 capsule (40 mg total) by mouth daily.    glucose blood (ACCU-CHEK GUIDE) test strip USE TO TEST BLOOD SUGAR 2X A DAY    hydrALAZINE (APRESOLINE) 100 MG tablet TAKE 1 TABLET(100 MG) BY MOUTH TWICE DAILY    JARDIANCE 25 MG TABS tablet TAKE 1 TABLET(25 MG) BY MOUTH DAILY BEFORE BREAKFAST    lidocaine (LIDODERM) 5 % Place 1 patch onto the skin daily. Remove & Discard patch within 12 hours or as directed by MD    metFORMIN (GLUCOPHAGE) 500 MG tablet Take 2 tablets (1,000 mg total) by mouth 2 (two) times daily with a meal.    metoprolol succinate (TOPROL-XL) 100 MG 24 hr tablet Take 1 tablet (100 mg total) by mouth 2 (two) times daily. Take with or immediately following a meal. To be taken with the 48m metoprolol tartrate    metoprolol tartrate (LOPRESSOR) 50 MG tablet Take 1 tablet (50 mg total) by mouth 2 (two) times daily. To be taken with the 1059mmetorprolol succinate.    montelukast (SINGULAIR) 10 MG tablet Take 10 mg by mouth daily.  morphine (MSIR) 15 MG tablet Take 1 tablet (15 mg total) by mouth every 6 (six) hours as needed for moderate pain or severe pain. Must last 30 days.   ° [START ON 03/20/2021] morphine (MSIR) 15 MG tablet Take 1 tablet (15 mg total) by mouth every 6 (six) hours as needed for moderate pain or severe pain. Must last 30 days.   ° [START ON 04/19/2021] morphine (MSIR) 15 MG tablet Take 1 tablet (15 mg total) by mouth every 6 (six) hours as needed for moderate pain or severe pain. Must  last 30 days. 02/14/2021: WARNING: Not a Duplicate. Future prescription. DO NOT DELETE during hospital medication reconciliation or at discharge. °[facility] Patient   ° Multiple Vitamin (MULTIVITAMIN WITH MINERALS) TABS tablet Take 1 tablet by mouth daily.   ° NONFORMULARY OR COMPOUNDED ITEM Apply 1-2 mLs topically 4 (four) times daily as needed. 10% Ketamine/2% Cyclobenzaprine/6% Gabapentin Cream 11/20/2020: DO NOT DELETE during hospital medication reconciliation or at discharge. °[facility] Patient   ° nortriptyline (PAMELOR) 25 MG capsule Take 2 capsules (50 mg total) by mouth at bedtime.   ° Potassium Chloride ER 20 MEQ TBCR Take 1 tablet by mouth daily.   ° STIOLTO RESPIMAT 2.5-2.5 MCG/ACT AERS Inhale 2 puffs into the lungs 4 (four) times daily as needed. Prn per pulmonology notes   ° SUMAtriptan (IMITREX) 50 MG tablet Take 1 tablet (50 mg total) by mouth every 2 (two) hours as needed for migraine. Take 1 tab at onset of migraine. May repeat in 2 hours if headache persists or recurs.   ° °Facility-Administered Encounter Medications as of 03/16/2021  °Medication  ° 0.9 %  sodium chloride infusion  ° sodium chloride flush (NS) 0.9 % injection 10 mL  ° ° °Patient Active Problem List  ° Diagnosis Date Noted  ° Chronic use of opiate for therapeutic purpose 08/20/2020  ° Malignant neoplasm of lung (HCC)   ° Goals of care, counseling/discussion 11/01/2019  ° Pulmonary emphysema (HCC) 08/25/2019  ° OSA (obstructive sleep apnea) 06/16/2019  ° History of colonic polyps 02/02/2019  ° Shortness of breath 02/02/2019  ° Chronic upper extremity pain (3ry area of Pain) (Bilateral) (L>R) 01/19/2019  ° Squamous cell carcinoma of left lung (HCC) 01/16/2019  ° Lung mass 01/14/2019  ° Left lower lobe pulmonary nodule 10/26/2018  ° History of allergy to ester type local anesthetic (procaine) 10/21/2018  °  Class: History of  ° Hyperlipidemia associated with type 2 diabetes mellitus (HCC) 07/20/2018   ° Type 2 diabetes mellitus with stage 1 chronic kidney disease, without long-term current use of insulin (HCC) 12/30/2017  ° S/P hip replacement 12/02/2017  ° Hip arthritis 10/01/2017  ° Aortic atherosclerosis (HCC) 08/25/2017  ° Left-sided weakness 08/13/2017  ° Musculoskeletal pain, chronic 04/03/2017  ° History of tobacco abuse 06/24/2016  ° GERD (gastroesophageal reflux disease) 06/24/2016  ° Tobacco abuse 06/24/2016  ° Pharmacologic therapy   ° Benign neoplasm of ascending colon   ° Polyp of sigmoid colon   ° Rectal polyp   ° Benign hypertensive renal disease 01/22/2016  ° Migraine 01/22/2016  ° Chronic pain syndrome 01/11/2016  ° Chronic sacroiliac joint pain (Bilateral) (L>R) 05/31/2015  ° Chronic hip pain (Left) 05/31/2015  ° Lumbar facet syndrome (Bilateral) (L>R) 05/31/2015  ° Chronic lower extremity pain (2ry area of Pain) (Left) 05/31/2015  ° Greater occipital neuralgia (Right) 05/31/2015  ° Retrolisthesis of L5-S1 02/06/2015  ° Cervical disc herniation (C4-5 and C5-6) 02/06/2015  °   Lumbar disc herniation (L5-S1) 02/06/2015   Hypokalemia 02/01/2015   Cervical spinal stenosis (C4-5) 01/06/2015   Cervical foraminal stenosis (Bilateral C5-6) 01/06/2015   Chronic low back pain (1ry area of Pain) (Bilateral) (L>R) 01/05/2015   Lumbar spondylosis 01/05/2015   Chronic lumbar radicular pain (S1 dermatomal) (Left) 01/05/2015   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) 01/05/2015   Chronic neck pain (posterior midline) (Bilateral) (L>R) 01/05/2015   Cervical spondylosis 01/05/2015   Chronic cervical radicular pain (Bilateral) (C5/C6 dermatome) (L>R) 01/05/2015   Long term current use of opiate analgesic 01/05/2015   Long term prescription opiate use 01/05/2015   Opiate use (60 MME/Day) 78/67/5449   Uncomplicated opioid dependence ([place]) 01/05/2015    Conditions to be addressed/monitored:HTN, DMII, and lung cancer, skin tag/lesion to back- removed on 03-12-2021  Care Plan :  RNCM: General Plan of Care (Adult) for Chronic Disease Management and Care Coordination Needs  Updates made by Vanita Ingles, RN since 03/16/2021 12:00 AM     Problem: RNCM: Development of Plan of Care for Chronic Disease Management (DM, HTN, Lung Cancer)   Priority: High     Long-Range Goal: RNCM: Effective Management  of Plan of Care for Chronic Disease Management (DM, HTN, Lung Cancer)   Start Date: 01/12/2021  Expected End Date: 01/12/2022  Priority: High  Note:   Current Barriers:  Knowledge Deficits related to plan of care for management of HTN, DMII, and Lung cancer  Chronic Disease Management support and education needs related to HTN, DMII, and Lung cancer   RNCM Clinical Goal(s):  Patient will verbalize understanding of plan for management of HTN, DMII, and lung cancer  as evidenced by keeping appointments, following the plan of care, working with the CCM team to effectively manage health and well being  demonstrate understanding of rationale for each prescribed medication as evidenced by compliance with medications and calling for refills before running out of medications     attend all scheduled medical appointments: saw pcp on 03-08-2021, calls for changes, was supposed to have a colonoscopy today but was not cleaned out enough, has to reschedule, as evidenced by keeping appointments and calling the office if appointment needs to be reschduled         demonstrate improved and ongoing adherence to prescribed treatment plan for HTN, DMII, and lung cancer  as evidenced by effective management of chronic conditions by working with the CCM team to optimize health and well being  demonstrate a decrease in HTN, DMII, and Lung cancer  exacerbations  as evidenced by effective management of chronic conditions  demonstrate ongoing self health care management ability for effective management of chronic conditions  as evidenced by working with the CCM team through collaboration with Consulting civil engineer, provider, and care team.   Interventions: 1:1 collaboration with primary care provider regarding development and update of comprehensive plan of care as evidenced by provider attestation and co-signature Inter-disciplinary care team collaboration (see longitudinal plan of care) Evaluation of current treatment plan related to  self management and patient's adherence to plan as established by provider   SDOH Barriers (Status: Goal on Track (progressing): YES.) Long Term Goal  Patient interviewed and SDOH assessment performed        Patient interviewed and appropriate assessments performed Provided patient with information about resources available and care guides to assist with new needs or changes in SDOH Discussed plans with patient for ongoing care management follow up and provided patient with direct contact information  for care management team °Advised patient to call the office for changes in SDOH, questions or concerns ° ° ° °Diabetes:  (Status: Goal on Track (progressing): YES.) Long Term Goal  ° °Lab Results  °Component Value Date  ° HGBA1C 6.8 (H) 03/08/2021  °Slight increase from last A1C.  °Assessed patient's understanding of A1c goal: <7% °Provided education to patient about basic DM disease process; °Reviewed medications with patient and discussed importance of medication adherence. 03-16-2021: The patient is compliant with medications. The patient denies any issues with DM medications         °Reviewed prescribed diet with patient heart healthy/ADA diet. 03-16-2021: The patient is compliant with heart healthy/ADA diet. The patient recently was on vacation in [place] and got back last week. He states he was eating some different things while away and his blood sugars went up slightly then. ; °Counseled on importance of regular laboratory monitoring as prescribed. 03-16-2021: Review of regular lab work to check levels        °Discussed plans with patient for ongoing care management  follow up and provided patient with direct contact information for care management team;      °Provided patient with written educational materials related to hypo and hyperglycemia and importance of correct treatment. 03-16-2021: Denies any lows a this time. Did have a blood sugar up in 200's while on vacation but states it is stable now that he is at home;       °Reviewed scheduled/upcoming provider appointments including: saw pcp 03-12-2021 for skin tag removal, calls the office for changes and new needs ;         °Advised patient, providing education and rationale, to check cbg as directed  and record. 03-16-2021: The patient states that his blood sugar average has been 130 to 140. Did have one last week around 200. Denies any acute findings.        °call provider for findings outside established parameters;       °Review of patient status, including review of consultants reports, relevant laboratory and other test results, and medications completed;       °Screening for signs and symptoms of depression related to chronic disease state;        °Assessed social determinant of health barriers;        ° °Hypertension: (Status: Goal on Track (progressing): YES.) °Last practice recorded BP readings:  °BP Readings from Last 3 Encounters:  °03/16/21 107/68  °03/12/21 131/78  °03/08/21 120/80  °Most recent eGFR/CrCl:  °Lab Results  °Component Value Date  ° EGFR 93 03/08/2021  °  No components found for: CRCL ° °Evaluation of current treatment plan related to hypertension self management and patient's adherence to plan as established by provider. 03-16-2021: The patient is doing well and denies any issues with his HTN management or heart health;   °Provided education to patient re: stroke prevention, s/s of heart attack and stroke; °Reviewed prescribed diet heart healthy/ADA °Reviewed medications with patient and discussed importance of compliance. 03-16-2021: Is compliant with his medications;  °Discussed plans with patient  for ongoing care management follow up and provided patient with direct contact information for care management team; °Advised patient, providing education and rationale, to monitor blood pressure daily and record, calling PCP for findings outside established parameters;  °Provided education on prescribed diet heart healthy/ADA;  °Discussed complications of poorly controlled blood pressure such as heart disease, stroke, circulatory complications, vision complications, kidney impairment, sexual dysfunction;  ° ° °Lung cancer   (  Status: Goal on Track (progressing): YES.) Long Term Goal  Evaluation of current treatment plan related to  Lung cancer  ,  self-management and patient's adherence to plan as established by provider. 03-16-2021:  The patient is currently not taking any treatments. He goes back in February for new scans and evaluation. At that point they will determine a course of action if any.  Discussed plans with patient for ongoing care management follow up and provided patient with direct contact information for care management team Advised patient to keep appointments, calling the office for changes in conditions, or sx and sx of adverse reactions to chemotherapy treatment for lung cancer ; Provided education to patient re: sx and sx to monitor for changes, reporting increased fatigue, nausea, or pain, maintaining nutrition and utilization of support system ; Reviewed medications with patient and discussed compliance ; Reviewed scheduled/upcoming provider appointments including February 2023 for repeat scan and appointment with oncologist; Discussed plans with patient for ongoing care management follow up and provided patient with direct contact information for care management team; Advised patient to discuss changes in disease progression with provider;    Skin tag/skin lesion to chest and back   (Status: Goal Met.) Short Term Goal 03-16-2021: The patient saw Dr. Wynetta Emery on 03-12-2021 and had the  skin tags removed. The patient is doing well and denies any acute distress. Said the felt better as soon as she finished and she did a good job. Denies any concerns or issues related to skin tag/lesion. Evaluation of current treatment plan related to  acute onset of skin tag or lesion to back and chest ,  self-management and patient's adherence to plan as established by provider. Discussed plans with patient for ongoing care management follow up and provided patient with direct contact information for care management team Advised patient to call the office and secure an appointment for follow up on skin tag or lession to mid back and chest. The patient states it is an area that is sore and the cancer center states that it is something the pcp needs to evaluate; Provided education to patient re: sx and sx of infection, calling the office for changes, and monitoring for changes in the 2 areas of concern; Collaborated with pcp regarding new onset of skin tags/lesion to back and chest; Reviewed scheduled/upcoming provider appointments including 03-06-2021, advised the patient he may need a sooner appointment to call and set up and appointment to see the pcp; Discussed plans with patient for ongoing care management follow up and provided patient with direct contact information for care management team;   Patient Goals/Self-Care Activities: Patient will self administer medications as prescribed as evidenced by self report/primary caregiver report  Patient will attend all scheduled provider appointments as evidenced by clinician review of documented attendance to scheduled appointments and patient/caregiver report Patient will call pharmacy for medication refills as evidenced by patient report and review of pharmacy fill history as appropriate Patient will attend church or other social activities as evidenced by patient report Patient will continue to perform ADL's independently as evidenced by  patient/caregiver report Patient will continue to perform IADL's independently as evidenced by patient/caregiver report Patient will call provider office for new concerns or questions as evidenced by review of documented incoming telephone call notes and patient report Patient will work with BSW to address care coordination needs and will continue to work with the clinical team to address health care and disease management related needs as evidenced by documented adherence to scheduled  care management/care coordination appointments schedule appointment with eye doctor check blood sugar at prescribed times: when you have symptoms of low or high blood sugar, before and after exercise, and as directed   check feet daily for cuts, sores or redness enter blood sugar readings and medication or insulin into daily log take the blood sugar log to all doctor visits trim toenails straight across drink 6 to 8 glasses of water each day eat fish at least once per week fill half of plate with vegetables limit fast food meals to no more than 1 per week manage portion size prepare main meal at home 3 to 5 days each week read food labels for fat, fiber, carbohydrates and portion size reduce red meat to 2 to 3 times a week set a realistic goal keep feet up while sitting wash and dry feet carefully every day wear comfortable, cotton socks wear comfortable, well-fitting shoes - check blood pressure weekly - choose a place to take my blood pressure (home, clinic or office, retail store) - learn about high blood pressure - keep a blood pressure log - take blood pressure log to all doctor appointments - call doctor for signs and symptoms of high blood pressure - develop an action plan for high blood pressure - keep all doctor appointments - take medications for blood pressure exactly as prescribed - report new symptoms to your doctor - eat more whole grains, fruits and vegetables, lean meats and healthy  fats       Plan:Telephone follow up appointment with care management team member scheduled for:  05-11-2021 at 345 pm  [place], MSN, [facility] Mobile: [phone]

## 2021-03-16 NOTE — Anesthesia Preprocedure Evaluation (Signed)
Anesthesia Evaluation    Airway Mallampati: III       Dental   Pulmonary sleep apnea , COPD, Patient abstained from smoking., former smoker,    Pulmonary exam normal        Cardiovascular hypertension, Normal cardiovascular exam     Neuro/Psych  Headaches,    GI/Hepatic GERD  ,  Endo/Other  diabetes  Renal/GU CRFRenal disease     Musculoskeletal  (+) Arthritis ,   Abdominal   Peds  Hematology   Anesthesia Other Findings   Reproductive/Obstetrics                             Anesthesia Physical Anesthesia Plan  ASA: 3  Anesthesia Plan: General   Post-op Pain Management:    Induction:   PONV Risk Score and Plan:   Airway Management Planned: Natural Airway  Additional Equipment:   Intra-op Plan:   Post-operative Plan:   Informed Consent: I have reviewed the patients History and Physical, chart, labs and discussed the procedure including the risks, benefits and alternatives for the proposed anesthesia with the patient or authorized representative who has indicated his/her understanding and acceptance.       Plan Discussed with: CRNA and Anesthesiologist  Anesthesia Plan Comments:         Anesthesia Quick Evaluation

## 2021-03-16 NOTE — Patient Instructions (Signed)
Visit Information  Thank you for taking time to visit with me today. Please don't hesitate to contact me if I can be of assistance to you before our next scheduled telephone appointment.  Following are the goals we discussed today:  RNCM Clinical Goal(s):  Patient will verbalize understanding of plan for management of HTN, DMII, and lung cancer  as evidenced by keeping appointments, following the plan of care, working with the CCM team to effectively manage health and well being  demonstrate understanding of rationale for each prescribed medication as evidenced by compliance with medications and calling for refills before running out of medications     attend all scheduled medical appointments: saw pcp on 03-08-2021, calls for changes, was supposed to have a colonoscopy today but was not cleaned out enough, has to reschedule, as evidenced by keeping appointments and calling the office if appointment needs to be reschduled         demonstrate improved and ongoing adherence to prescribed treatment plan for HTN, DMII, and lung cancer  as evidenced by effective management of chronic conditions by working with the CCM team to optimize health and well being  demonstrate a decrease in HTN, DMII, and Lung cancer  exacerbations  as evidenced by effective management of chronic conditions  demonstrate ongoing self health care management ability for effective management of chronic conditions  as evidenced by working with the CCM team through collaboration with Consulting civil engineer, provider, and care team.    Interventions: 1:1 collaboration with primary care provider regarding development and update of comprehensive plan of care as evidenced by provider attestation and co-signature Inter-disciplinary care team collaboration (see longitudinal plan of care) Evaluation of current treatment plan related to  self management and patient's adherence to plan as established by provider     SDOH Barriers (Status: Goal on  Track (progressing): YES.) Long Term Goal  Patient interviewed and SDOH assessment performed        Patient interviewed and appropriate assessments performed Provided patient with information about resources available and care guides to assist with new needs or changes in SDOH Discussed plans with patient for ongoing care management follow up and provided patient with direct contact information for care management team Advised patient to call the office for changes in [place], questions or concerns       Diabetes:  (Status: Goal on Track (progressing): YES.) Long Term Goal         Lab Results  Component Value Date    HGBA1C 6.8 (H) 03/08/2021  Slight increase from last A1C.  Assessed patient's understanding of A1c goal: <7% Provided education to patient about basic DM disease process; Reviewed medications with patient and discussed importance of medication adherence. 03-16-2021: The patient is compliant with medications. The patient denies any issues with DM medications         Reviewed prescribed diet with patient heart healthy/ADA diet. 03-16-2021: The patient is compliant with heart healthy/ADA diet. The patient recently was on vacation in [place] and got back last week. He states he was eating some different things while away and his blood sugars went up slightly then. ; Counseled on importance of regular laboratory monitoring as prescribed. 03-16-2021: Review of regular lab work to check levels        Discussed plans with patient for ongoing care management follow up and provided patient with direct contact information for care management team;      Provided patient with written educational materials related to hypo and hyperglycemia  and importance of correct treatment. 03-16-2021: Denies any lows a this time. Did have a blood sugar up in 200's while on vacation but states it is stable now that he is at home;       Reviewed scheduled/upcoming provider appointments including: saw pcp 03-12-2021  for skin tag removal, calls the office for changes and new needs ;         Advised patient, providing education and rationale, to check cbg as directed  and record. 03-16-2021: The patient states that his blood sugar average has been 130 to 140. Did have one last week around 200. Denies any acute findings.        call provider for findings outside established parameters;       Review of patient status, including review of consultants reports, relevant laboratory and other test results, and medications completed;       Screening for signs and symptoms of depression related to chronic disease state;        Assessed social determinant of health barriers;          Hypertension: (Status: Goal on Track (progressing): YES.) Last practice recorded BP readings:     BP Readings from Last 3 Encounters:  03/16/21 107/68  03/12/21 131/78  03/08/21 120/80  Most recent eGFR/CrCl:       Lab Results  Component Value Date    EGFR 93 03/08/2021    No components found for: CRCL   Evaluation of current treatment plan related to hypertension self management and patient's adherence to plan as established by provider. 03-16-2021: The patient is doing well and denies any issues with his HTN management or heart health;   Provided education to patient re: stroke prevention, s/s of heart attack and stroke; Reviewed prescribed diet heart healthy/ADA Reviewed medications with patient and discussed importance of compliance. 03-16-2021: Is compliant with his medications;  Discussed plans with patient for ongoing care management follow up and provided patient with direct contact information for care management team; Advised patient, providing education and rationale, to monitor blood pressure daily and record, calling PCP for findings outside established parameters;  Provided education on prescribed diet heart healthy/ADA;  Discussed complications of poorly controlled blood pressure such as heart disease, stroke,  circulatory complications, vision complications, kidney impairment, sexual dysfunction;      Lung cancer   (Status: Goal on Track (progressing): YES.) Long Term Goal  Evaluation of current treatment plan related to  Lung cancer  ,  self-management and patient's adherence to plan as established by provider. 03-16-2021:  The patient is currently not taking any treatments. He goes back in February for new scans and evaluation. At that point they will determine a course of action if any.  Discussed plans with patient for ongoing care management follow up and provided patient with direct contact information for care management team Advised patient to keep appointments, calling the office for changes in conditions, or sx and sx of adverse reactions to chemotherapy treatment for lung cancer ; Provided education to patient re: sx and sx to monitor for changes, reporting increased fatigue, nausea, or pain, maintaining nutrition and utilization of support system ; Reviewed medications with patient and discussed compliance ; Reviewed scheduled/upcoming provider appointments including February 2023 for repeat scan and appointment with oncologist; Discussed plans with patient for ongoing care management follow up and provided patient with direct contact information for care management team; Advised patient to discuss changes in disease progression with provider;      Skin  tag/skin lesion to chest and back   (Status: Goal Met.) Short Term Goal 03-16-2021: The patient saw Dr. Wynetta Emery on 03-12-2021 and had the skin tags removed. The patient is doing well and denies any acute distress. Said the felt better as soon as she finished and she did a good job. Denies any concerns or issues related to skin tag/lesion. Evaluation of current treatment plan related to  acute onset of skin tag or lesion to back and chest ,  self-management and patient's adherence to plan as established by provider. Discussed plans with patient for  ongoing care management follow up and provided patient with direct contact information for care management team Advised patient to call the office and secure an appointment for follow up on skin tag or lession to mid back and chest. The patient states it is an area that is sore and the cancer center states that it is something the pcp needs to evaluate; Provided education to patient re: sx and sx of infection, calling the office for changes, and monitoring for changes in the 2 areas of concern; Collaborated with pcp regarding new onset of skin tags/lesion to back and chest; Reviewed scheduled/upcoming provider appointments including 03-06-2021, advised the patient he may need a sooner appointment to call and set up and appointment to see the pcp; Discussed plans with patient for ongoing care management follow up and provided patient with direct contact information for care management team;    Patient Goals/Self-Care Activities: Patient will self administer medications as prescribed as evidenced by self report/primary caregiver report  Patient will attend all scheduled provider appointments as evidenced by clinician review of documented attendance to scheduled appointments and patient/caregiver report Patient will call pharmacy for medication refills as evidenced by patient report and review of pharmacy fill history as appropriate Patient will attend church or other social activities as evidenced by patient report Patient will continue to perform ADL's independently as evidenced by patient/caregiver report Patient will continue to perform IADL's independently as evidenced by patient/caregiver report Patient will call provider office for new concerns or questions as evidenced by review of documented incoming telephone call notes and patient report Patient will work with BSW to address care coordination needs and will continue to work with the clinical team to address health care and disease management  related needs as evidenced by documented adherence to scheduled care management/care coordination appointments schedule appointment with eye doctor check blood sugar at prescribed times: when you have symptoms of low or high blood sugar, before and after exercise, and as directed   check feet daily for cuts, sores or redness enter blood sugar readings and medication or insulin into daily log take the blood sugar log to all doctor visits trim toenails straight across drink 6 to 8 glasses of water each day eat fish at least once per week fill half of plate with vegetables limit fast food meals to no more than 1 per week manage portion size prepare main meal at home 3 to 5 days each week read food labels for fat, fiber, carbohydrates and portion size reduce red meat to 2 to 3 times a week set a realistic goal keep feet up while sitting wash and dry feet carefully every day wear comfortable, cotton socks wear comfortable, well-fitting shoes - check blood pressure weekly - choose a place to take my blood pressure (home, clinic or office, retail store) - learn about high blood pressure - keep a blood pressure log - take blood pressure log to  all doctor appointments - call doctor for signs and symptoms of high blood pressure - develop an action plan for high blood pressure - keep all doctor appointments - take medications for blood pressure exactly as prescribed - report new symptoms to your doctor - eat more whole grains, fruits and vegetables, lean meats and healthy fats          Our next appointment is by telephone on 05-11-2021 at 345 pm  Please call the care guide team at [phone] if you need to cancel or reschedule your appointment.   If you are experiencing a Mental Health or [place] or need someone to talk to, please call the Suicide and Crisis Lifeline: 988 call the [place] National Suicide Prevention Lifeline: 662-864-9055 or TTY: 620-624-8627 TTY  3405060969) to talk to a trained counselor call 1-800-273-TALK (toll free, 24 hour hotline)   Patient verbalizes understanding of instructions and care plan provided today and agrees to view in [place]. Active MyChart status confirmed with patient.    Noreene Larsson RN, MSN, [facility] Mobile: [phone]

## 2021-03-19 ENCOUNTER — Other Ambulatory Visit: Payer: Self-pay

## 2021-03-19 DIAGNOSIS — Z86010 Personal history of colonic polyps: Secondary | ICD-10-CM

## 2021-03-19 MED ORDER — PEG 3350-KCL-NA BICARB-NACL 420 G PO SOLR: 4000.0000 mL | Freq: Once | ORAL | 0 refills | Status: AC

## 2021-03-19 NOTE — Progress Notes (Unsigned)
Procedure has been rescheduled for 04/06/21. Additional prep instructions will be included. Patient's wife verbalized understanding.

## 2021-03-20 ENCOUNTER — Encounter: Payer: Self-pay | Admitting: Gastroenterology

## 2021-04-03 DIAGNOSIS — E1122 Type 2 diabetes mellitus with diabetic chronic kidney disease: Secondary | ICD-10-CM

## 2021-04-03 DIAGNOSIS — C3490 Malignant neoplasm of unspecified part of unspecified bronchus or lung: Secondary | ICD-10-CM | POA: Diagnosis not present

## 2021-04-03 DIAGNOSIS — I10 Essential (primary) hypertension: Secondary | ICD-10-CM

## 2021-04-03 DIAGNOSIS — N181 Chronic kidney disease, stage 1: Secondary | ICD-10-CM | POA: Diagnosis not present

## 2021-04-03 DIAGNOSIS — C3492 Malignant neoplasm of unspecified part of left bronchus or lung: Secondary | ICD-10-CM

## 2021-04-05 ENCOUNTER — Inpatient Hospital Stay (HOSPITAL_BASED_OUTPATIENT_CLINIC_OR_DEPARTMENT_OTHER): Payer: Medicare Other | Admitting: Oncology

## 2021-04-05 ENCOUNTER — Other Ambulatory Visit: Payer: Self-pay

## 2021-04-05 ENCOUNTER — Encounter: Payer: Self-pay | Admitting: Oncology

## 2021-04-05 ENCOUNTER — Inpatient Hospital Stay: Payer: Medicare Other | Attending: Oncology

## 2021-04-05 VITALS — BP 119/84 | HR 76 | Temp 98.1°F | Resp 16 | Wt 231.0 lb

## 2021-04-05 DIAGNOSIS — R0602 Shortness of breath: Secondary | ICD-10-CM | POA: Insufficient documentation

## 2021-04-05 DIAGNOSIS — Z886 Allergy status to analgesic agent status: Secondary | ICD-10-CM | POA: Diagnosis not present

## 2021-04-05 DIAGNOSIS — Z809 Family history of malignant neoplasm, unspecified: Secondary | ICD-10-CM | POA: Insufficient documentation

## 2021-04-05 DIAGNOSIS — Z452 Encounter for adjustment and management of vascular access device: Secondary | ICD-10-CM | POA: Diagnosis not present

## 2021-04-05 DIAGNOSIS — N181 Chronic kidney disease, stage 1: Secondary | ICD-10-CM | POA: Insufficient documentation

## 2021-04-05 DIAGNOSIS — E1122 Type 2 diabetes mellitus with diabetic chronic kidney disease: Secondary | ICD-10-CM | POA: Diagnosis not present

## 2021-04-05 DIAGNOSIS — C3492 Malignant neoplasm of unspecified part of left bronchus or lung: Secondary | ICD-10-CM

## 2021-04-05 DIAGNOSIS — E785 Hyperlipidemia, unspecified: Secondary | ICD-10-CM | POA: Insufficient documentation

## 2021-04-05 DIAGNOSIS — Z833 Family history of diabetes mellitus: Secondary | ICD-10-CM | POA: Insufficient documentation

## 2021-04-05 DIAGNOSIS — Z9049 Acquired absence of other specified parts of digestive tract: Secondary | ICD-10-CM | POA: Insufficient documentation

## 2021-04-05 DIAGNOSIS — C3432 Malignant neoplasm of lower lobe, left bronchus or lung: Secondary | ICD-10-CM | POA: Diagnosis not present

## 2021-04-05 DIAGNOSIS — Z95828 Presence of other vascular implants and grafts: Secondary | ICD-10-CM

## 2021-04-05 DIAGNOSIS — Z79899 Other long term (current) drug therapy: Secondary | ICD-10-CM | POA: Diagnosis not present

## 2021-04-05 DIAGNOSIS — Z880 Allergy status to penicillin: Secondary | ICD-10-CM | POA: Insufficient documentation

## 2021-04-05 DIAGNOSIS — Z8249 Family history of ischemic heart disease and other diseases of the circulatory system: Secondary | ICD-10-CM | POA: Insufficient documentation

## 2021-04-05 DIAGNOSIS — I129 Hypertensive chronic kidney disease with stage 1 through stage 4 chronic kidney disease, or unspecified chronic kidney disease: Secondary | ICD-10-CM | POA: Diagnosis not present

## 2021-04-05 DIAGNOSIS — Z87891 Personal history of nicotine dependence: Secondary | ICD-10-CM | POA: Diagnosis not present

## 2021-04-05 MED ORDER — BREZTRI AEROSPHERE 160-9-4.8 MCG/ACT IN AERO: 2.0000 | INHALATION_SPRAY | Freq: Two times a day (BID) | RESPIRATORY_TRACT | 2 refills | Status: DC

## 2021-04-05 MED ORDER — SODIUM CHLORIDE 0.9% FLUSH
10.0000 mL | Freq: Once | INTRAVENOUS | Status: AC
  Administered 2021-04-05: 10 mL via INTRAVENOUS
  Filled 2021-04-05: qty 10

## 2021-04-05 MED ORDER — HEPARIN SOD (PORK) LOCK FLUSH 100 UNIT/ML IV SOLN
500.0000 [IU] | Freq: Once | INTRAVENOUS | Status: AC
  Administered 2021-04-05: 500 [IU] via INTRAVENOUS
  Filled 2021-04-05: qty 5

## 2021-04-05 NOTE — Progress Notes (Signed)
[place]  [phone] [phone]) [phone]  ID: Kristopher [name] OB: Feb 06, 1961  MR#: [id number]  YJE#:[id number]  Patient Care Team: [name], DO as PCP - General (Family Medicine) Gavin Pound, CMA (Inactive) as Certified Medical Assistant Telford Nab, RN as Registered Nurse Vanita Ingles, RN as Case Manager (General Practice) [name], MD as Referring Physician (Pain Medicine) [name], MD as Consulting Physician (Oncology) Noreene Filbert, MD as Consulting Physician (Radiation Oncology)  CHIEF COMPLAINT: Clinical stage IIIa squamous cell 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  HPI: Patient underwent lung resection on January 14, 2019.  He had a complicated postoperative course. CT scan results from August 14, 2019 reviewed independently with a suspicious subcarinal lymph node measuring 1.4 cm.  Follow-up PET scan on August 23, 2019 revealed hypermetabolism in lymph node highly suspicious for recurrence.  Biopsy confirmed recurrence increasing patient's stage from IA up to IIIA. Patient completed concurrent XRT and weekly carboplatinum and Taxol on January 05, 2020.  Patient initiated 1 year of maintenance immunotherapy using durvalumab on January 19, 2020.  Restaging CT scan on December 14, 2020 reviewed independently and reported as above with no obvious evidence of recurrent or progressive disease.  INTERVAL HISTORY: Kristopher [name] presents today for follow-up.  In the interim, he has done well.  Denies any new concerns.  Requesting a refill on his inhalers.  He is unsure who initially prescribed the inhalers for him.  REVIEW OF SYSTEMS:   Review of Systems  Constitutional: Negative.  Negative for chills, fever, malaise/fatigue and weight loss.  HENT:  Negative for congestion, ear pain and tinnitus.   Eyes: Negative.  Negative for blurred vision and double vision.  Respiratory:  Positive for shortness of breath  (occasionl). Negative for cough and sputum production.   Cardiovascular: Negative.  Negative for chest pain, palpitations and leg swelling.  Gastrointestinal: Negative.  Negative for abdominal pain, constipation, diarrhea, nausea and vomiting.  Genitourinary:  Negative for dysuria, frequency and urgency.  Musculoskeletal:  Negative for back pain and falls.  Skin: Negative.  Negative for rash.  Neurological: Negative.  Negative for weakness and headaches.  Endo/Heme/Allergies: Negative.  Does not bruise/bleed easily.  Psychiatric/Behavioral: Negative.  Negative for depression. The patient is not nervous/anxious and does not have insomnia.    As per HPI. Otherwise, a complete review of systems is negative.  PAST MEDICAL HISTORY: Past Medical History:  Diagnosis Date   Allergy    Arthritis    left foot   Benign hypertensive kidney disease    Chronic back pain    Four rods in back   Diabetes mellitus, type 2 (HCC)    Dyspnea    GERD (gastroesophageal reflux disease)    Hypertension    Malignant neoplasm of lung (HCC)    Migraines    daily    PAST SURGICAL HISTORY: Past Surgical History:  Procedure Laterality Date   APPENDECTOMY     BACK SURGERY     COLONOSCOPY WITH PROPOFOL N/A 02/19/2016   Procedure: COLONOSCOPY WITH PROPOFOL;  Surgeon: Lucilla Lame, MD;  Location: [place];  Service: Endoscopy;  Laterality: N/A;   COLONOSCOPY WITH PROPOFOL N/A 01/19/2018   Procedure: COLONOSCOPY WITH PROPOFOL;  Surgeon: Lucilla Lame, MD;  Location: [place];  Service: Endoscopy;  Laterality: N/A;  Diabetic - oral meds   COLONOSCOPY WITH PROPOFOL N/A 03/16/2021   Procedure: COLONOSCOPY WITH PROPOFOL;  Surgeon: Lucilla Lame, MD;  Location: [place];  Service: Endoscopy;  Laterality: N/A;  INADEQUATE PREP   DG OPERATIVE LEFT HIP ([place] HX)     10/19   ELECTROMAGNETIC NAVIGATION BROCHOSCOPY Left 11/18/2018   Procedure: ELECTROMAGNETIC NAVIGATION BRONCHOSCOPY;   Surgeon: Tyler Pita, MD;  Location: [facility];  Service: Cardiopulmonary;  Laterality: Left;   FLEXIBLE BRONCHOSCOPY Bilateral 01/20/2019   Procedure: FLEXIBLE BRONCHOSCOPY;  Surgeon: Ottie Glazier, MD;  Location: [facility];  Service: Thoracic;  Laterality: Bilateral;   FLEXIBLE BRONCHOSCOPY Bilateral 01/22/2019   Procedure: FLEXIBLE BRONCHOSCOPY;  Surgeon: Ottie Glazier, MD;  Location: [facility];  Service: Thoracic;  Laterality: Bilateral;   FOOT SURGERY Left    Screws and plates   JOINT REPLACEMENT Left 12/2017   DR Rudene Christians Hip   KNEE SURGERY Left    X 2   LEG SURGERY     LUNG CANCER SURGERY     POLYPECTOMY N/A 02/19/2016   Procedure: POLYPECTOMY;  Surgeon: Lucilla Lame, MD;  Location: [place];  Service: Endoscopy;  Laterality: N/A;   POLYPECTOMY  01/19/2018   Procedure: POLYPECTOMY;  Surgeon: Lucilla Lame, MD;  Location: [place];  Service: Endoscopy;;   PORTACATH PLACEMENT N/A 11/11/2019   Procedure: INSERTION PORT-A-CATH;  Surgeon: Nestor Lewandowsky, MD;  Location: [facility];  Service: General;  Laterality: N/A;   THORACOTOMY Left 01/14/2019   Procedure: THORACOTOMY MAJOR, LEFT;  Surgeon: Nestor Lewandowsky, MD;  Location: [facility];  Service: General;  Laterality: Left;   TOTAL HIP ARTHROPLASTY Left 12/02/2017   Procedure: TOTAL HIP ARTHROPLASTY ANTERIOR APPROACH;  Surgeon: Hessie Knows, MD;  Location: [facility];  Service: Orthopedics;  Laterality: Left;   VIDEO BRONCHOSCOPY Left 01/14/2019   Procedure: VIDEO BRONCHOSCOPY WITH FLUORO, LEFT;  Surgeon: Nestor Lewandowsky, MD;  Location: [facility];  Service: General;  Laterality: Left;   VIDEO BRONCHOSCOPY WITH ENDOBRONCHIAL NAVIGATION N/A 10/15/2019   Procedure: VIDEO BRONCHOSCOPY WITH ENDOBRONCHIAL NAVIGATION;  Surgeon: Ottie Glazier, MD;  Location: [facility];  Service: Thoracic;  Laterality: N/A;   VIDEO BRONCHOSCOPY WITH ENDOBRONCHIAL ULTRASOUND N/A 10/15/2019   Procedure: VIDEO BRONCHOSCOPY WITH ENDOBRONCHIAL ULTRASOUND;   Surgeon: Ottie Glazier, MD;  Location: [facility];  Service: Thoracic;  Laterality: N/A;    FAMILY HISTORY: Family History  Problem Relation Age of Onset   Cancer Father    Diabetes Sister    Thrombosis Sister     ADVANCED DIRECTIVES (Y/N):  N  HEALTH MAINTENANCE: Social History   Tobacco Use   Smoking status: Former    Packs/day: 0.25    Years: 35.00    Pack years: 8.75    Types: Cigarettes    Quit date: 01/14/2019    Years since quitting: 2.2   Smokeless tobacco: Never  Vaping Use   Vaping Use: Never used  Substance Use Topics   Alcohol use: No    Alcohol/week: 0.0 standard drinks   Drug use: Yes    Types: Morphine    Comment: prescribed     Colonoscopy:  PAP:  Bone density:  Lipid panel:  Allergies  Allergen Reactions   Acetaminophen Swelling   Aspirin Anaphylaxis   Epinephrine Anaphylaxis    Does not include albuterol   Novocain [Procaine] Anaphylaxis   Penicillins Anaphylaxis    Has patient had a PCN reaction causing immediate rash, facial/tongue/throat swelling, SOB or lightheadedness with hypotension: Yes Has patient had a PCN reaction causing severe rash involving mucus membranes or skin necrosis: No Has patient had a PCN reaction that required hospitalization: Yes Has patient had a PCN reaction occurring within  the last 10 years: No If all of the above answers are "NO", then may proceed with Cephalosporin use.    Strawberry Extract Anaphylaxis   Shellfish Allergy Hives and Nausea And Vomiting    Current Outpatient Medications  Medication Sig Dispense Refill   Accu-Chek FastClix Lancets MISC USE TO TEST BLOOD SUGAR 2X A DAY 100 each 12   amLODipine (NORVASC) 5 MG tablet TAKE 1 TABLET(5 MG) BY MOUTH TWICE DAILY 180 tablet 1   atorvastatin (LIPITOR) 10 MG tablet Take 1 tablet (10 mg total) by mouth daily at 6 PM. 90 tablet 1   BREZTRI AEROSPHERE 160-9-4.8 MCG/ACT AERO Inhale 2 puffs into the lungs 2 (two) times daily. Maintenance per pulmonology      diphenhydrAMINE (BENADRYL) 25 MG tablet Take 50 mg by mouth daily.     esomeprazole (NEXIUM) 40 MG capsule Take 1 capsule (40 mg total) by mouth daily. 90 capsule 3   glucose blood (ACCU-CHEK GUIDE) test strip USE TO TEST BLOOD SUGAR 2X A DAY 100 strip 3   hydrALAZINE (APRESOLINE) 100 MG tablet TAKE 1 TABLET(100 MG) BY MOUTH TWICE DAILY 180 tablet 1   JARDIANCE 25 MG TABS tablet TAKE 1 TABLET(25 MG) BY MOUTH DAILY BEFORE BREAKFAST 90 tablet 0   lidocaine (LIDODERM) 5 % Place 1 patch onto the skin daily. Remove & Discard patch within 12 hours or as directed by MD 30 patch 12   metFORMIN (GLUCOPHAGE) 500 MG tablet Take 2 tablets (1,000 mg total) by mouth 2 (two) times daily with a meal. 360 tablet 1   metoprolol succinate (TOPROL-XL) 100 MG 24 hr tablet Take 1 tablet (100 mg total) by mouth 2 (two) times daily. Take with or immediately following a meal. To be taken with the 78m metoprolol tartrate 180 tablet 1   metoprolol tartrate (LOPRESSOR) 50 MG tablet Take 1 tablet (50 mg total) by mouth 2 (two) times daily. To be taken with the 1060mmetorprolol succinate. 180 tablet 1   montelukast (SINGULAIR) 10 MG tablet Take 10 mg by mouth daily.     morphine (MSIR) 15 MG tablet Take 1 tablet (15 mg total) by mouth every 6 (six) hours as needed for moderate pain or severe pain. Must last 30 days. 120 tablet 0   Multiple Vitamin (MULTIVITAMIN WITH MINERALS) TABS tablet Take 1 tablet by mouth daily.     NONFORMULARY OR COMPOUNDED ITEM Apply 1-2 mLs topically 4 (four) times daily as needed. 10% Ketamine/2% Cyclobenzaprine/6% Gabapentin Cream 240 each PRN   nortriptyline (PAMELOR) 25 MG capsule Take 2 capsules (50 mg total) by mouth at bedtime. 180 capsule 1   Potassium Chloride ER 20 MEQ TBCR Take 1 tablet by mouth daily. 90 tablet 1   STIOLTO RESPIMAT 2.5-2.5 MCG/ACT AERS Inhale 2 puffs into the lungs 4 (four) times daily as needed. Prn per pulmonology notes     SUMAtriptan (IMITREX) 50 MG tablet Take 1  tablet (50 mg total) by mouth every 2 (two) hours as needed for migraine. Take 1 tab at onset of migraine. May repeat in 2 hours if headache persists or recurs. 10 tablet 12   morphine (MSIR) 15 MG tablet Take 1 tablet (15 mg total) by mouth every 6 (six) hours as needed for moderate pain or severe pain. Must last 30 days. 120 tablet 0   [START ON 04/19/2021] morphine (MSIR) 15 MG tablet Take 1 tablet (15 mg total) by mouth every 6 (six) hours as needed for moderate pain or severe pain. Must  last 30 days. 120 tablet 0   No current facility-administered medications for this visit.   Facility-Administered Medications Ordered in Other Visits  Medication Dose Route Frequency Provider Last Rate Last Admin   0.9 %  sodium chloride infusion    Continuous PRN Dionne Bucy, CRNA   New Bag at 01/22/19 0705   sodium chloride flush (NS) 0.9 % injection 10 mL  10 mL Intracatheter PRN [name], MD   10 mL at 03/01/20 6767    OBJECTIVE: There were no vitals filed for this visit.    There is no height or weight on file to calculate BMI.    ECOG FS:0 - Asymptomatic  Physical Exam Constitutional:      Appearance: Normal appearance.  HENT:     Head: Normocephalic and atraumatic.  Eyes:     Pupils: Pupils are equal, round, and reactive to light.  Cardiovascular:     Rate and Rhythm: Normal rate and regular rhythm.     Heart sounds: Normal heart sounds. No murmur heard. Pulmonary:     Effort: Pulmonary effort is normal.     Breath sounds: Normal breath sounds. No wheezing.  Abdominal:     General: Bowel sounds are normal. There is no distension.     Palpations: Abdomen is soft.     Tenderness: There is no abdominal tenderness.  Musculoskeletal:        General: Normal range of motion.     Cervical back: Normal range of motion.  Skin:    General: Skin is warm and dry.     Findings: No rash.  Neurological:     Mental Status: He is alert and oriented to person, place, and time.   Psychiatric:        Judgment: Judgment normal.    LAB RESULTS:  Lab Results  Component Value Date   NA 141 03/08/2021   K 3.9 03/08/2021   CL 101 03/08/2021   CO2 25 03/08/2021   GLUCOSE 165 (H) 03/08/2021   BUN 7 03/08/2021   CREATININE 0.94 03/08/2021   CALCIUM 9.7 03/08/2021   PROT 7.7 03/08/2021   ALBUMIN 4.8 03/08/2021   AST 51 (H) 03/08/2021   ALT 66 (H) 03/08/2021   ALKPHOS 155 (H) 03/08/2021   BILITOT 0.4 03/08/2021   GFRNONAA >60 01/03/2021   GFRAA >60 12/01/2019    Lab Results  Component Value Date   WBC 5.9 03/08/2021   NEUTROABS 3.7 03/08/2021   HGB 15.3 03/08/2021   HCT 46.2 03/08/2021   MCV 89 03/08/2021   PLT 201 03/08/2021     STUDIES: No results found.  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  PLAN:   Clinically he is doing well.  Had imaging back in October that did not reveal any evidence of recurrence of disease.  He will be due for a repeat CT scan in about 3 months.  This was ordered today.  He will then follow-up with Dr. Grayland Ormond to discuss the results.  Shortness of breath-occurs occasionally and has been using [employer] as needed for shortness of breath and wheezing.  He also has a albuterol inhaler.  Disposition-CT scan in 3 months followed by a visit with Dr. Grayland Ormond to review results.  He will also need a port flush at that time.  I spent 20 minutes dedicated to the care of this patient (face-to-face and non-face-to-face) on the date of the encounter to include what is described in the  assessment and plan.  Patient expressed understanding and was in agreement with this plan. He also understands that He can call clinic at any time with any questions, concerns, or complaints.    Cancer Staging  Squamous cell carcinoma of left lung [facility]) Staging form: Lung, AJCC 8th Edition - Clinical stage from 02/12/2019: Stage IIIA (cT1b, cN2, cM0) - Signed by [name], MD on  11/01/2019   Jacquelin Hawking, NP   04/05/2021 2:27 PM

## 2021-04-05 NOTE — Progress Notes (Signed)
Pt in for follow up denies any concerns.

## 2021-04-05 NOTE — Progress Notes (Signed)
Survivorship Care Plan visit completed.  Treatment summary reviewed and given to patient.  ASCO answers booklet reviewed and given to patient.  CARE program and Cancer Transitions discussed with patient along with other resources cancer center offers to patients and caregivers.  Patient verbalized understanding.    

## 2021-04-06 ENCOUNTER — Ambulatory Visit
Admission: RE | Admit: 2021-04-06 | Discharge: 2021-04-06 | Disposition: A | Discharge status: Home / Self Care | Payer: Medicare Other | Source: Ambulatory Visit | Attending: Gastroenterology | Admitting: Gastroenterology

## 2021-04-06 ENCOUNTER — Encounter
Admission: RE | Disposition: A | Discharge status: Home / Self Care | Payer: Self-pay | Source: Ambulatory Visit | Attending: Gastroenterology

## 2021-04-06 ENCOUNTER — Ambulatory Visit: Payer: Medicare Other | Admitting: Anesthesiology

## 2021-04-06 ENCOUNTER — Other Ambulatory Visit: Payer: Self-pay

## 2021-04-06 ENCOUNTER — Encounter: Payer: Self-pay | Admitting: Gastroenterology

## 2021-04-06 DIAGNOSIS — E1122 Type 2 diabetes mellitus with diabetic chronic kidney disease: Secondary | ICD-10-CM | POA: Diagnosis not present

## 2021-04-06 DIAGNOSIS — I129 Hypertensive chronic kidney disease with stage 1 through stage 4 chronic kidney disease, or unspecified chronic kidney disease: Secondary | ICD-10-CM | POA: Diagnosis not present

## 2021-04-06 DIAGNOSIS — Z6832 Body mass index (BMI) 32.0-32.9, adult: Secondary | ICD-10-CM | POA: Insufficient documentation

## 2021-04-06 DIAGNOSIS — D125 Benign neoplasm of sigmoid colon: Secondary | ICD-10-CM | POA: Diagnosis not present

## 2021-04-06 DIAGNOSIS — E785 Hyperlipidemia, unspecified: Secondary | ICD-10-CM | POA: Diagnosis not present

## 2021-04-06 DIAGNOSIS — M1990 Unspecified osteoarthritis, unspecified site: Secondary | ICD-10-CM | POA: Diagnosis not present

## 2021-04-06 DIAGNOSIS — K635 Polyp of colon: Secondary | ICD-10-CM

## 2021-04-06 DIAGNOSIS — N189 Chronic kidney disease, unspecified: Secondary | ICD-10-CM | POA: Insufficient documentation

## 2021-04-06 DIAGNOSIS — Z86010 Personal history of colonic polyps: Secondary | ICD-10-CM

## 2021-04-06 DIAGNOSIS — F17210 Nicotine dependence, cigarettes, uncomplicated: Secondary | ICD-10-CM | POA: Diagnosis not present

## 2021-04-06 DIAGNOSIS — R519 Headache, unspecified: Secondary | ICD-10-CM | POA: Diagnosis not present

## 2021-04-06 DIAGNOSIS — Z1211 Encounter for screening for malignant neoplasm of colon: Secondary | ICD-10-CM | POA: Diagnosis not present

## 2021-04-06 DIAGNOSIS — K219 Gastro-esophageal reflux disease without esophagitis: Secondary | ICD-10-CM | POA: Diagnosis not present

## 2021-04-06 DIAGNOSIS — E669 Obesity, unspecified: Secondary | ICD-10-CM | POA: Insufficient documentation

## 2021-04-06 HISTORY — PX: COLONOSCOPY WITH PROPOFOL: SHX5780

## 2021-04-06 LAB — GLUCOSE, CAPILLARY
Glucose-Capillary: 142 mg/dL — ABNORMAL HIGH (ref 70–99)
Glucose-Capillary: 118 mg/dL — ABNORMAL HIGH (ref 70–99)

## 2021-04-06 SURGERY — COLONOSCOPY WITH PROPOFOL
Anesthesia: General

## 2021-04-06 MED ORDER — SODIUM CHLORIDE 0.9 % IV SOLN: INTRAVENOUS | Status: DC

## 2021-04-06 MED ORDER — PROPOFOL 10 MG/ML IV BOLUS
INTRAVENOUS | Status: DC | PRN
  Administered 2021-04-06 (×2): 20 mg via INTRAVENOUS
  Administered 2021-04-06 (×2): 50 mg via INTRAVENOUS
  Administered 2021-04-06: 20 mg via INTRAVENOUS
  Administered 2021-04-06: 50 mg via INTRAVENOUS
  Administered 2021-04-06: 20 mg via INTRAVENOUS
  Administered 2021-04-06: 30 mg via INTRAVENOUS
  Administered 2021-04-06: 50 mg via INTRAVENOUS
  Administered 2021-04-06: 20 mg via INTRAVENOUS

## 2021-04-06 MED ORDER — ACETAMINOPHEN 10 MG/ML IV SOLN: 1000.0000 mg | Freq: Once | INTRAVENOUS | Status: DC | PRN

## 2021-04-06 MED ORDER — ONDANSETRON HCL 4 MG/2ML IJ SOLN: 4.0000 mg | Freq: Once | INTRAMUSCULAR | Status: DC | PRN

## 2021-04-06 MED ORDER — LACTATED RINGERS IV SOLN: INTRAVENOUS | Status: DC

## 2021-04-06 MED ORDER — STERILE WATER FOR IRRIGATION IR SOLN
Status: DC | PRN
  Administered 2021-04-06: 500 mL

## 2021-04-06 MED ORDER — EPHEDRINE SULFATE (PRESSORS) 50 MG/ML IJ SOLN
INTRAMUSCULAR | Status: DC | PRN
  Administered 2021-04-06 (×3): 10 mg via INTRAVENOUS

## 2021-04-06 SURGICAL SUPPLY — 22 items
CLIP HMST 235XBRD CATH ROT (MISCELLANEOUS) IMPLANT
CLIP RESOLUTION 360 11X235 (MISCELLANEOUS)
ELECT REM PT RETURN 9FT ADLT (ELECTROSURGICAL)
ELECTRODE REM PT RTRN 9FT ADLT (ELECTROSURGICAL) IMPLANT
FORCEPS BIOP RAD 4 LRG CAP 4 (CUTTING FORCEPS) IMPLANT
GOWN CVR UNV OPN BCK APRN NK (MISCELLANEOUS) ×2 IMPLANT
GOWN ISOL THUMB LOOP REG UNIV (MISCELLANEOUS) ×4
INJECTOR VARIJECT VIN23 (MISCELLANEOUS) IMPLANT
KIT DEFENDO VALVE AND CONN (KITS) IMPLANT
KIT PRC NS LF DISP ENDO (KITS) ×1 IMPLANT
KIT PROCEDURE OLYMPUS (KITS) ×2
MANIFOLD NEPTUNE II (INSTRUMENTS) ×2 IMPLANT
MARKER SPOT ENDO TATTOO 5ML (MISCELLANEOUS) IMPLANT
PROBE APC STR FIRE (PROBE) IMPLANT
RETRIEVER NET ROTH 2.5X230 LF (MISCELLANEOUS) IMPLANT
SNARE COLD EXACTO (MISCELLANEOUS) ×1 IMPLANT
SNARE SHORT THROW 13M SML OVAL (MISCELLANEOUS) IMPLANT
SNARE SNG USE RND 15MM (INSTRUMENTS) IMPLANT
SPOT EX ENDOSCOPIC TATTOO (MISCELLANEOUS)
TRAP ETRAP POLY (MISCELLANEOUS) ×1 IMPLANT
VARIJECT INJECTOR VIN23 (MISCELLANEOUS)
WATER STERILE IRR 250ML POUR (IV SOLUTION) ×2 IMPLANT

## 2021-04-06 NOTE — Anesthesia Preprocedure Evaluation (Signed)
Anesthesia Evaluation  Patient identified by MRN, date of birth, ID band Patient awake    Reviewed: Allergy & Precautions, NPO status , Patient's Chart, lab work & pertinent test results, reviewed documented beta blocker date and time   History of Anesthesia Complications Negative for: history of anesthetic complications  Airway Mallampati: III  TM Distance: >3 FB Neck ROM: Limited    Dental  (+) Upper Dentures, Partial Lower   Pulmonary sleep apnea , COPD, Current SmokerPatient did not abstain from smoking.,   Lung cancer   breath sounds clear to auscultation       Cardiovascular hypertension, (-) angina(-) DOE  Rhythm:Regular Rate:Normal   HLD   Neuro/Psych  Headaches,  Neuromuscular disease (Sciatica, radicular cervical pain)    GI/Hepatic GERD  Controlled,  Endo/Other  diabetes, Type 2  Renal/GU CRFRenal disease     Musculoskeletal  (+) Arthritis ,   Abdominal (+) + obese (BMI 33),   Peds  Hematology   Anesthesia Other Findings Lung cancer Chronic pain on chronic opioids  Reproductive/Obstetrics                             Anesthesia Physical Anesthesia Plan  ASA: 3  Anesthesia Plan: General   Post-op Pain Management:    Induction: Intravenous  PONV Risk Score and Plan: 1 and Propofol infusion, TIVA and Treatment may vary due to age or medical condition  Airway Management Planned: Natural Airway and Nasal Cannula  Additional Equipment:   Intra-op Plan:   Post-operative Plan:   Informed Consent: I have reviewed the patients History and Physical, chart, labs and discussed the procedure including the risks, benefits and alternatives for the proposed anesthesia with the patient or authorized representative who has indicated his/her understanding and acceptance.       Plan Discussed with: CRNA and Anesthesiologist  Anesthesia Plan Comments:         Anesthesia  Quick Evaluation

## 2021-04-06 NOTE — Transfer of Care (Signed)
Immediate Anesthesia Transfer of Care Note  Patient: Kristopher [name]  Procedure(s) Performed: COLONOSCOPY WITH PROPOFOL  Patient Location: PACU  Anesthesia Type: General  Level of Consciousness: awake, alert  and patient cooperative  Airway and Oxygen Therapy: Patient Spontanous Breathing and Patient connected to supplemental oxygen  Post-op Assessment: Post-op Vital signs reviewed, Patient's Cardiovascular Status Stable, Respiratory Function Stable, Patent Airway and No signs of Nausea or vomiting  Post-op Vital Signs: Reviewed and stable  Complications: No notable events documented.

## 2021-04-06 NOTE — Interval H&P Note (Signed)
Lucilla Lame, MD [address] [place], [place] [address] [phone] Fax : [phone]  Primary Care Physician:  [name], DO Primary Gastroenterologist:  Dr. Allen Norris  Pre-Procedure History & Physical: HPI:  Kristopher [name] is a 60 y.o. male is here for an colonoscopy.   Past Medical History:  Diagnosis Date   Allergy    Arthritis    left foot   Benign hypertensive kidney disease    Chronic back pain    Four rods in back   Diabetes mellitus, type 2 (HCC)    Dyspnea    GERD (gastroesophageal reflux disease)    Hypertension    Malignant neoplasm of lung (HCC)    Migraines    daily    Past Surgical History:  Procedure Laterality Date   APPENDECTOMY     BACK SURGERY     COLONOSCOPY WITH PROPOFOL N/A 02/19/2016   Procedure: COLONOSCOPY WITH PROPOFOL;  Surgeon: Lucilla Lame, MD;  Location: [facility];  Service: Endoscopy;  Laterality: N/A;   COLONOSCOPY WITH PROPOFOL N/A 01/19/2018   Procedure: COLONOSCOPY WITH PROPOFOL;  Surgeon: Lucilla Lame, MD;  Location: [facility];  Service: Endoscopy;  Laterality: N/A;  Diabetic - oral meds   COLONOSCOPY WITH PROPOFOL N/A 03/16/2021   Procedure: COLONOSCOPY WITH PROPOFOL;  Surgeon: Lucilla Lame, MD;  Location: [facility];  Service: Endoscopy;  Laterality: N/A;  INADEQUATE PREP   DG OPERATIVE LEFT HIP (Impact HX)     10/19   ELECTROMAGNETIC NAVIGATION BROCHOSCOPY Left 11/18/2018   Procedure: ELECTROMAGNETIC NAVIGATION BRONCHOSCOPY;  Surgeon: Tyler Pita, MD;  Location: [facility];  Service: Cardiopulmonary;  Laterality: Left;   FLEXIBLE BRONCHOSCOPY Bilateral 01/20/2019   Procedure: FLEXIBLE BRONCHOSCOPY;  Surgeon: Ottie Glazier, MD;  Location: [facility];  Service: Thoracic;  Laterality: Bilateral;   FLEXIBLE BRONCHOSCOPY Bilateral 01/22/2019   Procedure: FLEXIBLE BRONCHOSCOPY;  Surgeon: Ottie Glazier, MD;  Location: [facility];  Service: Thoracic;  Laterality: Bilateral;   FOOT  SURGERY Left    Screws and plates   JOINT REPLACEMENT Left 12/2017   DR Rudene Christians Hip   KNEE SURGERY Left    X 2   LEG SURGERY     LUNG CANCER SURGERY     POLYPECTOMY N/A 02/19/2016   Procedure: POLYPECTOMY;  Surgeon: Lucilla Lame, MD;  Location: [facility];  Service: Endoscopy;  Laterality: N/A;   POLYPECTOMY  01/19/2018   Procedure: POLYPECTOMY;  Surgeon: Lucilla Lame, MD;  Location: [facility];  Service: Endoscopy;;   PORTACATH PLACEMENT N/A 11/11/2019   Procedure: INSERTION PORT-A-CATH;  Surgeon: Nestor Lewandowsky, MD;  Location: [facility];  Service: General;  Laterality: N/A;   THORACOTOMY Left 01/14/2019   Procedure: THORACOTOMY MAJOR, LEFT;  Surgeon: Nestor Lewandowsky, MD;  Location: [facility];  Service: General;  Laterality: Left;   TOTAL HIP ARTHROPLASTY Left 12/02/2017   Procedure: TOTAL HIP ARTHROPLASTY ANTERIOR APPROACH;  Surgeon: Hessie Knows, MD;  Location: [facility];  Service: Orthopedics;  Laterality: Left;   VIDEO BRONCHOSCOPY Left 01/14/2019   Procedure: VIDEO BRONCHOSCOPY WITH FLUORO, LEFT;  Surgeon: Nestor Lewandowsky, MD;  Location: [facility];  Service: General;  Laterality: Left;   VIDEO BRONCHOSCOPY WITH ENDOBRONCHIAL NAVIGATION N/A 10/15/2019   Procedure: VIDEO BRONCHOSCOPY WITH ENDOBRONCHIAL NAVIGATION;  Surgeon: Ottie Glazier, MD;  Location: [facility];  Service: Thoracic;  Laterality: N/A;   VIDEO BRONCHOSCOPY WITH ENDOBRONCHIAL ULTRASOUND N/A 10/15/2019   Procedure: VIDEO BRONCHOSCOPY WITH ENDOBRONCHIAL ULTRASOUND;  Surgeon: Ottie Glazier, MD;  Location: [facility];  Service: Thoracic;  Laterality: N/A;    Prior to Admission medications   Medication Sig Start Date End Date Taking? Authorizing Provider  amLODipine (NORVASC) 5 MG tablet TAKE 1 TABLET(5 MG) BY MOUTH TWICE DAILY 03/08/21  Yes Johnson, [name], DO  atorvastatin (LIPITOR) 10 MG tablet Take 1 tablet (10 mg total) by mouth daily at 6 PM. 03/08/21  Yes Johnson, [name], DO  BREZTRI AEROSPHERE 160-9-4.8 MCG/ACT  AERO Inhale 2 puffs into the lungs 2 (two) times daily. Maintenance per pulmonology 04/05/21  Yes Jacquelin Hawking, NP  diphenhydrAMINE (BENADRYL) 25 MG tablet Take 50 mg by mouth daily.   Yes [provider]  esomeprazole (NEXIUM) 40 MG capsule Take 1 capsule (40 mg total) by mouth daily. 09/01/20  Yes Johnson, [name], DO  hydrALAZINE (APRESOLINE) 100 MG tablet TAKE 1 TABLET(100 MG) BY MOUTH TWICE DAILY 03/08/21  Yes Johnson, [name], DO  JARDIANCE 25 MG TABS tablet TAKE 1 TABLET(25 MG) BY MOUTH DAILY BEFORE BREAKFAST 12/21/20  Yes Johnson, [name], DO  lidocaine (LIDODERM) 5 % Place 1 patch onto the skin daily. Remove & Discard patch within 12 hours or as directed by MD 09/14/20  Yes Jon Billings, NP  metFORMIN (GLUCOPHAGE) 500 MG tablet Take 2 tablets (1,000 mg total) by mouth 2 (two) times daily with a meal. 03/08/21  Yes Johnson, [name], DO  metoprolol succinate (TOPROL-XL) 100 MG 24 hr tablet Take 1 tablet (100 mg total) by mouth 2 (two) times daily. Take with or immediately following a meal. To be taken with the 78m metoprolol tartrate 03/08/21  Yes Johnson, [name], DO  metoprolol tartrate (LOPRESSOR) 50 MG tablet Take 1 tablet (50 mg total) by mouth 2 (two) times daily. To be taken with the 1047mmetorprolol succinate. 03/08/21  Yes Johnson, [name], DO  montelukast (SINGULAIR) 10 MG tablet Take 10 mg by mouth daily. 02/09/19  Yes [provider]  morphine (MSIR) 15 MG tablet Take 1 tablet (15 mg total) by mouth every 6 (six) hours as needed for moderate pain or severe pain. Must last 30 days. 02/18/21 04/06/21 Yes NaMilinda PointerMD  Multiple Vitamin (MULTIVITAMIN WITH MINERALS) TABS tablet Take 1 tablet by mouth daily.   Yes [provider]  nortriptyline (PAMELOR) 25 MG capsule Take 2 capsules (50 mg total) by mouth at bedtime. 03/08/21  Yes Johnson, [name], DO  Potassium Chloride ER 20 MEQ TBCR Take 1 tablet by mouth daily. 03/08/21  Yes Johnson, [name], DO  STIOLTO  RESPIMAT 2.5-2.5 MCG/ACT AERS Inhale 2 puffs into the lungs 4 (four) times daily as needed. Prn per pulmonology notes 10/22/19  Yes [provider]  SUMAtriptan (IMITREX) 50 MG tablet Take 1 tablet (50 mg total) by mouth every 2 (two) hours as needed for migraine. Take 1 tab at onset of migraine. May repeat in 2 hours if headache persists or recurs. 09/14/20  Yes HoJon BillingsNP  Accu-Chek FastClix Lancets MISC USE TO TEST BLOOD SUGAR 2X A DAY 04/26/19   [place] AmericanPA-C  glucose blood (ACCU-CHEK GUIDE) test strip USE TO TEST BLOOD SUGAR 2X A DAY 03/09/20   Johnson, [name], DO  morphine (MSIR) 15 MG tablet Take 1 tablet (15 mg total) by mouth every 6 (six) hours as needed for moderate pain or severe pain. Must last 30 days. 03/20/21 04/19/21  NaMilinda PointerMD  morphine (MSIR) 15 MG tablet Take 1 tablet (15 mg total) by mouth every 6 (six)  hours as needed for moderate pain or severe pain. Must last 30 days. 04/19/21 05/19/21  [name], MD  NONFORMULARY OR COMPOUNDED ITEM Apply 1-2 mLs topically 4 (four) times daily as needed. 10% Ketamine/2% Cyclobenzaprine/6% Gabapentin Cream 11/20/20 11/20/21  [name], MD    Allergies as of 03/19/2021 - Review Complete 03/16/2021  Allergen Reaction Noted   Acetaminophen Swelling 07/23/2019   Aspirin Anaphylaxis 01/22/2016   Epinephrine Anaphylaxis 08/19/2017   Novocain [procaine] Anaphylaxis 02/15/2016   Penicillins Anaphylaxis 11/11/2014   Strawberry extract Anaphylaxis 08/19/2017   Shellfish allergy Hives and Nausea And Vomiting 12/02/2017    Family History  Problem Relation Age of Onset   Cancer Father    Diabetes Sister    Thrombosis Sister     Social History   Socioeconomic History   Marital status: Significant Other    Spouse name: Not on file   Number of children: Not on file   Years of education: Not on file   Highest education level: Not on file  Occupational History   Occupation: disability   Tobacco Use   Smoking status: Former    Packs/day: 0.25    Years: 35.00    Pack years: 8.75    Types: Cigarettes    Quit date: 01/14/2019    Years since quitting: 2.2   Smokeless tobacco: Never  Vaping Use   Vaping Use: Never used  Substance and Sexual Activity   Alcohol use: No    Alcohol/week: 0.0 standard drinks   Drug use: Yes    Types: Morphine    Comment: prescribed   Sexual activity: Yes  Other Topics Concern   Not on file  Social History Narrative   Not on file   Social Determinants of Health   Financial Resource Strain: Medium Risk   Difficulty of Paying Living Expenses: Somewhat hard  Food Insecurity: No Food Insecurity   Worried About Charity fundraiser in the Last Year: Never true   Ran Out of Food in the Last Year: Never true  Transportation Needs: No Transportation Needs   Lack of Transportation (Medical): No   Lack of Transportation (Non-Medical): No  Physical Activity: Inactive   Days of Exercise per Week: 0 days   Minutes of Exercise per Session: 0 min  Stress: No Stress Concern Present   Feeling of Stress : Not at all  Social Connections: Socially Integrated   Frequency of Communication with Friends and Family: More than three times a week   Frequency of Social Gatherings with Friends and Family: More than three times a week   Attends Religious Services: More than 4 times per year   Active Member of [employer] or Organizations: Yes   Attends Music therapist: More than 4 times per year   Marital Status: Married  Human resources officer Violence: Not At Risk   Fear of Current or Ex-Partner: No   Emotionally Abused: No   Physically Abused: No   Sexually Abused: No    Review of Systems: See HPI, otherwise negative ROS  Physical Exam: BP 118/82    Pulse 71    Temp 98.1 F (36.7 C) (Temporal)    Resp 20    Ht _0  (1.778 m)    Wt 105.2 kg    SpO2 96%    BMI 33.29 kg/m  General:   Alert,  pleasant and cooperative in NAD Head:   Normocephalic and atraumatic. Neck:  Supple; no masses or thyromegaly. Lungs:  Clear throughout to auscultation.  Heart:  Regular rate and rhythm. Abdomen:  Soft, nontender and nondistended. Normal bowel sounds, without guarding, and without rebound.   Neurologic:  Alert and  oriented x4;  grossly normal neurologically.  Impression/Plan: Kristopher [name] is here for an colonoscopy to be performed for a history of adenomatous polyps on 2019  Risks, benefits, limitations, and alternatives regarding  colonoscopy have been reviewed with the patient.  Questions have been answered.  All parties agreeable.   Lucilla Lame, MD  04/06/2021, 10:56 AM

## 2021-04-06 NOTE — Anesthesia Postprocedure Evaluation (Signed)
Anesthesia Post Note  Patient: Kristopher [name]  Procedure(s) Performed: COLONOSCOPY WITH PROPOFOL     Patient location during evaluation: PACU Anesthesia Type: General Level of consciousness: awake and alert Pain management: pain level controlled Vital Signs Assessment: post-procedure vital signs reviewed and stable Respiratory status: spontaneous breathing, nonlabored ventilation, respiratory function stable and patient connected to nasal cannula oxygen Cardiovascular status: blood pressure returned to baseline and stable Postop Assessment: no apparent nausea or vomiting Anesthetic complications: no   No notable events documented.  Nikol Lemar A  Micheala Morissette

## 2021-04-06 NOTE — Op Note (Signed)
[facility] Gastroenterology Patient Name: Kristopher [name] Procedure Date: 04/06/2021 10:59 AM MRN: [id number] Account #: [id number] Date of Birth: Oct 19, 1960 Admit Type: Outpatient Age: 61 Room: [facility] OR ROOM 01 Gender: Male Note Status: Finalized Instrument Name: [id number] Procedure:             Colonoscopy Indications:           High risk colon cancer surveillance: Personal history                         of colonic polyps Providers:             Lucilla Lame MD, MD Referring MD:          [name] (Referring MD) Medicines:             Propofol per Anesthesia Complications:         No immediate complications. Procedure:             Pre-Anesthesia Assessment:                        - Prior to the procedure, a History and Physical was                         performed, and patient medications and allergies were                         reviewed. The patient's tolerance of previous                         anesthesia was also reviewed. The risks and benefits                         of the procedure and the sedation options and risks                         were discussed with the patient. All questions were                         answered, and informed consent was obtained. Prior                         Anticoagulants: The patient has taken no previous                         anticoagulant or antiplatelet agents. ASA Grade                         Assessment: II - A patient with mild systemic disease.                         After reviewing the risks and benefits, the patient                         was deemed in satisfactory condition to undergo the                         procedure.  After obtaining informed consent, the colonoscope was                         passed under direct vision. Throughout the procedure,                         the patient's blood pressure, pulse, and oxygen                         saturations were monitored  continuously. The                         Colonoscope was introduced through the anus and                         advanced to the the cecum, identified by appendiceal                         orifice and ileocecal valve. The colonoscopy was                         performed without difficulty. The patient tolerated                         the procedure well. The quality of the bowel                         preparation was good. Findings:      The perianal and digital rectal examinations were normal.      Three sessile polyps were found in the sigmoid colon. The polyps were 3       to 5 mm in size. These polyps were removed with a cold snare. Resection       and retrieval were complete.      A 4 mm polyp was found in the ascending colon. The polyp was sessile.       The polyp was removed with a cold snare. Resection and retrieval were       complete.      Non-bleeding internal hemorrhoids were found during retroflexion. The       hemorrhoids were Grade II (internal hemorrhoids that prolapse but reduce       spontaneously). Impression:            - Three 3 to 5 mm polyps in the sigmoid colon, removed                         with a cold snare. Resected and retrieved.                        - One 4 mm polyp in the ascending colon, removed with                         a cold snare. Resected and retrieved.                        - Non-bleeding internal hemorrhoids. Recommendation:        - Discharge patient to home.                        -  Resume previous diet.                        - Continue present medications.                        - Await pathology results.                        - Repeat colonoscopy in 5 years for surveillance. Procedure Code(s):     --- Professional ---                        [phone], Colonoscopy, flexible; with removal of                         tumor(s), polyp(s), or other lesion(s) by snare                         technique Diagnosis Code(s):     --- Professional  ---                        Z86.010, Personal history of colonic polyps                        K63.5, Polyp of colon CPT copyright 2019 American Medical Association. All rights reserved. The codes documented in this report are preliminary and upon coder review may  be revised to meet current compliance requirements. Lucilla Lame MD, MD 04/06/2021 11:33:45 AM This report has been signed electronically. Number of Addenda: 0 Note Initiated On: 04/06/2021 10:59 AM Scope Withdrawal Time: 0 hours 13 minutes 21 seconds  Total Procedure Duration: 0 hours 24 minutes 8 seconds  Estimated Blood Loss:  Estimated blood loss: none.      [facility]

## 2021-04-09 LAB — SURGICAL PATHOLOGY

## 2021-04-10 ENCOUNTER — Other Ambulatory Visit: Payer: Self-pay

## 2021-04-10 ENCOUNTER — Ambulatory Visit
Admission: RE | Admit: 2021-04-10 | Discharge: 2021-04-10 | Disposition: A | Discharge status: Home / Self Care | Payer: Medicare Other | Source: Ambulatory Visit | Attending: Nurse Practitioner | Admitting: Nurse Practitioner

## 2021-04-10 ENCOUNTER — Ambulatory Visit: Payer: Self-pay | Admitting: *Deleted

## 2021-04-10 ENCOUNTER — Encounter: Payer: Self-pay | Admitting: Oncology

## 2021-04-10 ENCOUNTER — Ambulatory Visit (INDEPENDENT_AMBULATORY_CARE_PROVIDER_SITE_OTHER): Payer: Medicare Other | Admitting: Nurse Practitioner

## 2021-04-10 ENCOUNTER — Ambulatory Visit
Admission: RE | Admit: 2021-04-10 | Discharge: 2021-04-10 | Disposition: A | Discharge status: Home / Self Care | Payer: Medicare Other | Attending: Nurse Practitioner | Admitting: Nurse Practitioner

## 2021-04-10 ENCOUNTER — Encounter: Payer: Self-pay | Admitting: Gastroenterology

## 2021-04-10 ENCOUNTER — Encounter: Payer: Self-pay | Admitting: Nurse Practitioner

## 2021-04-10 VITALS — BP 119/77 | HR 78 | Temp 97.9°F | Ht 70.0 in | Wt 228.4 lb

## 2021-04-10 DIAGNOSIS — R062 Wheezing: Secondary | ICD-10-CM | POA: Diagnosis not present

## 2021-04-10 DIAGNOSIS — R0981 Nasal congestion: Secondary | ICD-10-CM

## 2021-04-10 DIAGNOSIS — R52 Pain, unspecified: Secondary | ICD-10-CM | POA: Diagnosis not present

## 2021-04-10 DIAGNOSIS — R0989 Other specified symptoms and signs involving the circulatory and respiratory systems: Secondary | ICD-10-CM | POA: Diagnosis not present

## 2021-04-10 DIAGNOSIS — R059 Cough, unspecified: Secondary | ICD-10-CM | POA: Diagnosis not present

## 2021-04-10 DIAGNOSIS — R6883 Chills (without fever): Secondary | ICD-10-CM | POA: Diagnosis not present

## 2021-04-10 LAB — VERITOR FLU A/B WAIVED
Influenza A: NEGATIVE
Influenza B: NEGATIVE

## 2021-04-10 MED ORDER — DOXYCYCLINE HYCLATE 100 MG PO TABS: 100.0000 mg | ORAL_TABLET | Freq: Two times a day (BID) | ORAL | 0 refills | Status: AC

## 2021-04-10 MED ORDER — PREDNISONE 20 MG PO TABS: 40.0000 mg | ORAL_TABLET | Freq: Every day | ORAL | 0 refills | Status: AC

## 2021-04-10 NOTE — Patient Instructions (Signed)

## 2021-04-10 NOTE — Assessment & Plan Note (Signed)
Acute x 4 days -- negative flu today.  Is not Covid or flu vaccinated, higher risk with underlying lung disease.  Will Covid test today and recommend he self quarantine until results return.  Concern for COPD exacerbation -- treat at this time with Doxycycline 100 MG BID x 7 days and Prednisone 40 MG daily for 5 days. Obtain chest imaging due to exam findings to ensure no PNA developing. Recommend he use inhalers as ordered.  Recommend: - Increased rest - Increasing Fluids - Acetaminophen as needed for fever/pain.  - Salt water gargling, chloraseptic spray and throat lozenges - Coricidin or Diabetic Tussin - Humidifying the air. Return in 2 weeks for lung check.

## 2021-04-10 NOTE — Telephone Encounter (Signed)
Summary: congestion / rx request   The patient has experienced sinus discomfort for roughly 4 days   The patient has previously taken over the counter medication but it is ineffective   The patient is experiencing significant congestion and cough   The patient would like to be prescribed something for their symptoms   Please contact further      Reason for Disposition  [1] SEVERE pain AND [2] not improved 2 hours after pain medicine  Answer Assessment - Initial Assessment Questions 1. LOCATION: "Where does it hurt?"      Forehead and behind eyes 2. ONSET: "When did the sinus pain start?"  (e.g., hours, days)      3-4 days 3. SEVERITY: "How bad is the pain?"   (Scale 1-10; mild, moderate or severe)   - MILD (1-3): doesn't interfere with normal activities    - MODERATE (4-7): interferes with normal activities (e.g., work or school) or awakens from sleep   - SEVERE (8-10): excruciating pain and patient unable to do any normal activities        moderate 4. RECURRENT SYMPTOM: "Have you ever had sinus problems before?" If Yes, ask: "When was the last time?" and "What happened that time?"      Yes- at least once/year 5. NASAL CONGESTION: "Is the nose blocked?" If Yes, ask: "Can you open it or must you breathe through your mouth?"     Yes- blocked 6. NASAL DISCHARGE: "Do you have discharge from your nose?" If so ask, "What color?"     green 7. FEVER: "Do you have a fever?" If Yes, ask: "What is it, how was it measured, and when did it start?"      No 8. OTHER SYMPTOMS: "Do you have any other symptoms?" (e.g., sore throat, cough, earache, difficulty breathing)     Ear pressure, sore throat 9. PREGNANCY: "Is there any chance you are pregnant?" "When was your last menstrual period?"     *No Answer*  Protocols used: Sinus Pain or Congestion-A-AH

## 2021-04-10 NOTE — Progress Notes (Signed)
BP 119/77    Pulse 78    Temp 97.9 F (36.6 C) (Oral)    Ht 5\' 10"  (1.778 m)    Wt 228 lb 6.4 oz (103.6 kg)    SpO2 95%    BMI 32.77 kg/m    Subjective:    Patient ID: Kristopher [name], male    DOB: 04/01/1960, 61 y.o.   MRN: [id number]  HPI: Kristopher [name] is a 61 y.o. male  Chief Complaint  Patient presents with   Sinus Problem    Patient states he has pressure in his head, both of his ears are hurting, wheezing and then he is coughing greenish phlegm. Patient states his symptoms started about 3 days ago and he has tried TheraFlu. He says it helped a little bit, but not much. Patient denies having fever, but has had chills.    Nasal Congestion   UPPER RESPIRATORY TRACT INFECTION Started with symptoms 3-4 days ago, his cousin came around 3-5 days ago and was sick.  Has not been Covid vaccinated, nor flu vaccine.  Has lung disease and uses Breztri daily, Stiolto PRN -- does not have nebulizer, can not use those.   Worst symptom: congestion Fever: no Cough: yes -- lots of phlegm Shortness of breath: no Wheezing: yes Chest pain: no Chest tightness: yes Chest congestion: yes Nasal congestion: yes Runny nose: yes Post nasal drip: yes Sneezing: no Sore throat:  a little bit Swollen glands: no Sinus pressure: yes Headache: yes Face pain: yes Toothache: no Ear pain: yes bilateral Ear pressure: yes bilateral Eyes red/itching:no Eye drainage/crusting: no  Vomiting: no Rash: no Fatigue: yes Sick contacts: yes Strep contacts: no  Context: stable Recurrent sinusitis: no Relief with OTC cold/cough medications: yes  Treatments attempted: cold/sinus   Relevant past medical, surgical, family and social history reviewed and updated as indicated. Interim medical history since our last visit reviewed. Allergies and medications reviewed and updated.  Review of Systems  Constitutional:  Positive for fatigue. Negative for activity change, appetite change, chills, diaphoresis and  fever.  HENT:  Positive for congestion, postnasal drip, rhinorrhea, sinus pressure and sore throat. Negative for hearing loss, sinus pain, sneezing and voice change.   Respiratory:  Positive for cough, chest tightness and wheezing. Negative for shortness of breath.   Cardiovascular: Negative.   Gastrointestinal: Negative.   Musculoskeletal:  Negative for myalgias.  Neurological:  Positive for headaches. Negative for dizziness, seizures, weakness and numbness.  Hematological: Negative.    Per HPI unless specifically indicated above     Objective:    BP 119/77    Pulse 78    Temp 97.9 F (36.6 C) (Oral)    Ht 5\' 10"  (1.778 m)    Wt 228 lb 6.4 oz (103.6 kg)    SpO2 95%    BMI 32.77 kg/m   Wt Readings from Last 3 Encounters:  04/10/21 228 lb 6.4 oz (103.6 kg)  04/06/21 232 lb (105.2 kg)  04/05/21 231 lb (104.8 kg)    Physical Exam Vitals and nursing note reviewed.  Constitutional:      General: He is awake. He is not in acute distress.    Appearance: He is well-developed and well-groomed. He is obese. He is not ill-appearing or toxic-appearing.  HENT:     Head: Normocephalic and atraumatic.     Right Ear: Hearing, ear canal and external ear normal. No drainage. A middle ear effusion is present.     Left Ear: Hearing, ear canal  and external ear normal. No drainage. A middle ear effusion is present.     Nose: Rhinorrhea present. Rhinorrhea is clear.     Right Sinus: Frontal sinus tenderness present. No maxillary sinus tenderness.     Left Sinus: Frontal sinus tenderness present. No maxillary sinus tenderness.     Mouth/Throat:     Mouth: Mucous membranes are moist.     Pharynx: Uvula midline. Posterior oropharyngeal erythema (mild with cobblestoning) present. No pharyngeal swelling.  Eyes:     General: Lids are normal.        Right eye: No discharge.        Left eye: No discharge.     Conjunctiva/sclera: Conjunctivae normal.     Pupils: Pupils are equal, round, and reactive to  light.  Neck:     Thyroid: No thyromegaly.     Vascular: No carotid bruit.  Cardiovascular:     Rate and Rhythm: Normal rate and regular rhythm.     Heart sounds: Normal heart sounds, S1 normal and S2 normal. No murmur heard.   No gallop.  Pulmonary:     Effort: Pulmonary effort is normal. No accessory muscle usage or respiratory distress.     Breath sounds: Examination of the left-upper field reveals rhonchi. Examination of the left-lower field reveals rhonchi. Wheezing and rhonchi present.     Comments: Audible expiratory wheezes throughout, slight rhonchi noted to left upper and lower lobes. Abdominal:     General: Bowel sounds are normal.     Palpations: Abdomen is soft. There is no hepatomegaly or splenomegaly.  Musculoskeletal:        General: Normal range of motion.     Cervical back: Normal range of motion and neck supple.     Right lower leg: No edema.     Left lower leg: No edema.  Lymphadenopathy:     Cervical: No cervical adenopathy.  Skin:    General: Skin is warm and dry.     Capillary Refill: Capillary refill takes less than 2 seconds.     Findings: No rash.  Neurological:     Mental Status: He is alert and oriented to person, place, and time.     Deep Tendon Reflexes: Reflexes are normal and symmetric.  Psychiatric:        Attention and Perception: Attention normal.        Mood and Affect: Mood normal.        Speech: Speech normal.        Behavior: Behavior normal. Behavior is cooperative.        Thought Content: Thought content normal.    Results for orders placed or performed in visit on 04/10/21  Influenza A & B (STAT)  Result Value Ref Range   Influenza A Negative Negative   Influenza B Negative Negative      Assessment & Plan:   Problem List Items Addressed This Visit       Respiratory   Sinus congestion - Primary    Acute x 4 days -- negative flu today.  Is not Covid or flu vaccinated, higher risk with underlying lung disease.  Will Covid test  today and recommend he self quarantine until results return.  Concern for COPD exacerbation -- treat at this time with Doxycycline 100 MG BID x 7 days and Prednisone 40 MG daily for 5 days. Obtain chest imaging due to exam findings to ensure no PNA developing. Recommend he use inhalers as ordered.  Recommend: - Increased rest -  Increasing Fluids - Acetaminophen as needed for fever/pain.  - Salt water gargling, chloraseptic spray and throat lozenges - Coricidin or Diabetic Tussin - Humidifying the air. Return in 2 weeks for lung check.      Relevant Orders   Influenza A & B (STAT) (Completed)   Novel Coronavirus, NAA ([facility])   DG Chest 2 View     Follow up plan: Return in about 2 weeks (around 04/24/2021) for Cough follow-up with Dr. Lenna Sciara.

## 2021-04-10 NOTE — Telephone Encounter (Signed)
°  Chief Complaint: sinus infection Symptoms: sinus pain, congestion, cough Frequency: 3-4 day Pertinent Negatives:  Disposition: [] ED /[] Urgent Care (no appt availability in office) / [x] Appointment(In office/virtual)/ []  [facility]/ [] Home Care/ [] Refused Recommended Disposition /[] [facility]/ []  Follow-up with PCP Additional Notes: Patient requesting medictaion

## 2021-04-11 LAB — NOVEL CORONAVIRUS, NAA: SARS-CoV-2, NAA: DETECTED — AB

## 2021-04-11 LAB — SARS-COV-2, NAA 2 DAY TAT

## 2021-04-11 NOTE — Progress Notes (Signed)
Contacted via MyChart   Good evening Kristopher [name], your Covid testing returned positive.  Based on your discussion yesterday you said symptoms started 3-4 days ago, which would put you on day 4-5 today -- do you know exactly when you start feeling bad?  If it has been 5 days or less then you would qualify for the oral Covid treatment, Molnupiravir is what I use most often as it has less interactions.  This medication works by binding with the virus in your body and preventing it from spreading to other cells, that way it lessens illness and risk for hospitalization.  Would you like to have this sent in?  Again this should only be used if symptoms 5 days or less, so if symptoms longer then this I would just continue current treatment sent in.  Any questions?  Please ensure to self quarantine for 5 days since symptoms onset and then 5 days of masking.

## 2021-04-11 NOTE — Progress Notes (Signed)
Contacted via MyChart   No pneumonia noted on imaging!!  Great news.

## 2021-04-11 NOTE — Progress Notes (Signed)
Sent MyChart, but please check in morning to see if he received this: Kristopher [name], your Covid testing returned positive.  Based on your discussion yesterday you said symptoms started 3-4 days ago, which would put you on day 4-5 today -- do you know exactly when you start feeling bad?  If it has been 5 days or less then you would qualify for the oral Covid treatment, Molnupiravir is what I use most often as it has less interactions.  This medication works by binding with the virus in your body and preventing it from spreading to other cells, that way it lessens illness and risk for hospitalization.  Would you like to have this sent in?  Again this should only be used if symptoms 5 days or less, so if symptoms longer then this I would just continue current treatment sent in.  Any questions?  Please ensure to self quarantine for 5 days since symptoms onset and then 5 days of masking.

## 2021-04-12 MED ORDER — MOLNUPIRAVIR EUA 200MG CAPSULE: 4.0000 | ORAL_CAPSULE | Freq: Two times a day (BID) | ORAL | 0 refills | Status: AC

## 2021-04-12 NOTE — Addendum Note (Signed)
Addended by: Marnee Guarneri T on: 04/12/2021 11:57 AM   Modules accepted: Orders

## 2021-04-24 ENCOUNTER — Ambulatory Visit (INDEPENDENT_AMBULATORY_CARE_PROVIDER_SITE_OTHER): Payer: Medicare Other | Admitting: Family Medicine

## 2021-04-24 ENCOUNTER — Other Ambulatory Visit: Payer: Self-pay

## 2021-04-24 ENCOUNTER — Telehealth: Payer: Self-pay | Admitting: Family Medicine

## 2021-04-24 ENCOUNTER — Encounter: Payer: Self-pay | Admitting: Family Medicine

## 2021-04-24 ENCOUNTER — Other Ambulatory Visit: Payer: Self-pay | Admitting: Family Medicine

## 2021-04-24 VITALS — BP 126/79 | HR 80 | Temp 98.2°F | Wt 230.2 lb

## 2021-04-24 DIAGNOSIS — J441 Chronic obstructive pulmonary disease with (acute) exacerbation: Secondary | ICD-10-CM

## 2021-04-24 DIAGNOSIS — J439 Emphysema, unspecified: Secondary | ICD-10-CM | POA: Diagnosis not present

## 2021-04-24 MED ORDER — ALBUTEROL SULFATE (2.5 MG/3ML) 0.083% IN NEBU
2.5000 mg | INHALATION_SOLUTION | Freq: Once | RESPIRATORY_TRACT | Status: AC
  Administered 2021-04-24: 2.5 mg via RESPIRATORY_TRACT

## 2021-04-24 MED ORDER — PREDNISONE 10 MG PO TABS: ORAL_TABLET | ORAL | 0 refills | Status: DC

## 2021-04-24 MED ORDER — ALBUTEROL SULFATE (2.5 MG/3ML) 0.083% IN NEBU: 2.5000 mg | INHALATION_SOLUTION | Freq: Four times a day (QID) | RESPIRATORY_TRACT | 1 refills | Status: DC | PRN

## 2021-04-24 NOTE — Telephone Encounter (Signed)
Dr. Wynetta Emery*

## 2021-04-24 NOTE — Telephone Encounter (Signed)
Pt called I says Dr Jenny Reichmann was to send in a nebbulizer, Please cal back to confirm if this is the case.

## 2021-04-24 NOTE — Progress Notes (Signed)
BP 126/79    Pulse 80    Temp 98.2 F (36.8 C)    Wt 230 lb 3.2 oz (104.4 kg)    SpO2 97%    BMI 33.03 kg/m    Subjective:    Patient ID: Kristopher [name], male    DOB: Feb 03, 1961, 61 y.o.   MRN: [id number]  HPI: TAKAO LIZER is a 61 y.o. male  Chief Complaint  Patient presents with   Wheezing    Patient states he is wheezing still, worse in the morning and night    COPD- Had covid about 2 weeks ago. Feeling better, but still wheezing and coughing COPD status: exacerbated Satisfied with current treatment?: yes Oxygen use: no Dyspnea frequency: occasionally Cough frequency: rarely- more clearing throat Rescue inhaler frequency:  daily  Limitation of activity: no Productive cough: none Pneumovax: Up to Date Influenza: Up to Date   Relevant past medical, surgical, family and social history reviewed and updated as indicated. Interim medical history since our last visit reviewed. Allergies and medications reviewed and updated.  Review of Systems  Constitutional: Negative.   HENT: Negative.    Eyes: Negative.   Respiratory:  Positive for cough and shortness of breath. Negative for apnea, choking, chest tightness, wheezing and stridor.   Cardiovascular: Negative.   Gastrointestinal: Negative.   Musculoskeletal: Negative.   Psychiatric/Behavioral: Negative.     Per HPI unless specifically indicated above     Objective:    BP 126/79    Pulse 80    Temp 98.2 F (36.8 C)    Wt 230 lb 3.2 oz (104.4 kg)    SpO2 97%    BMI 33.03 kg/m   Wt Readings from Last 3 Encounters:  04/24/21 230 lb 3.2 oz (104.4 kg)  04/10/21 228 lb 6.4 oz (103.6 kg)  04/06/21 232 lb (105.2 kg)    Physical Exam Vitals and nursing note reviewed.  Constitutional:      General: He is not in acute distress.    Appearance: Normal appearance. He is not ill-appearing, toxic-appearing or diaphoretic.  HENT:     Head: Normocephalic and atraumatic.     Right Ear: External ear normal.     Left Ear:  External ear normal.     Nose: Nose normal.     Mouth/Throat:     Mouth: Mucous membranes are moist.     Pharynx: Oropharynx is clear.  Eyes:     General: No scleral icterus.       Right eye: No discharge.        Left eye: No discharge.     Extraocular Movements: Extraocular movements intact.     Conjunctiva/sclera: Conjunctivae normal.     Pupils: Pupils are equal, round, and reactive to light.  Cardiovascular:     Rate and Rhythm: Normal rate and regular rhythm.     Pulses: Normal pulses.     Heart sounds: Normal heart sounds. No murmur heard.   No friction rub. No gallop.  Pulmonary:     Effort: Pulmonary effort is normal. No respiratory distress.     Breath sounds: No stridor. Wheezing present. No rhonchi or rales.  Chest:     Chest wall: No tenderness.  Musculoskeletal:        General: Normal range of motion.     Cervical back: Normal range of motion and neck supple.  Skin:    General: Skin is warm and dry.     Capillary Refill: Capillary refill takes less  than 2 seconds.     Coloration: Skin is not jaundiced or pale.     Findings: No bruising, erythema, lesion or rash.  Neurological:     General: No focal deficit present.     Mental Status: He is alert and oriented to person, place, and time. Mental status is at baseline.  Psychiatric:        Mood and Affect: Mood normal.        Behavior: Behavior normal.        Thought Content: Thought content normal.        Judgment: Judgment normal.    Results for orders placed or performed in visit on 04/10/21  Novel Coronavirus, NAA ([facility])   Specimen: Nasopharyngeal(NP) swabs in vial transport medium  Result Value Ref Range   SARS-CoV-2, NAA Detected (A) Not Detected  SARS-COV-2, NAA 2 DAY TAT  Result Value Ref Range   SARS-CoV-2, NAA 2 DAY TAT Performed   Influenza A & B (STAT)  Result Value Ref Range   Influenza A Negative Negative   Influenza B Negative Negative      Assessment & Plan:   Problem List Items  Addressed This Visit       Respiratory   Pulmonary emphysema ([place]) - Primary    Will treat with prednisone and get started on nebulizer. Call with any concerns. Recheck 2 weeks.       Relevant Medications   predniSONE (DELTASONE) 10 MG tablet   albuterol (PROVENTIL) (2.5 MG/3ML) 0.083% nebulizer solution     Follow up plan: Return in about 2 weeks (around 05/08/2021).

## 2021-04-24 NOTE — Telephone Encounter (Signed)
Returned patient phone call advised order for nebulizer will be sent to [facility] and they will contact him when its ready.

## 2021-04-24 NOTE — Assessment & Plan Note (Signed)
Will treat with prednisone and get started on nebulizer. Call with any concerns. Recheck 2 weeks.

## 2021-04-26 NOTE — Telephone Encounter (Signed)
Kara from [facility] called in to confirm if fax was received for pt to get nebulizer.

## 2021-04-26 NOTE — Telephone Encounter (Signed)
Confirmed with Salena Saner that we have received fax. Waiting on provider signature.

## 2021-04-27 DIAGNOSIS — J430 Unilateral pulmonary emphysema [MacLeod's syndrome]: Secondary | ICD-10-CM | POA: Diagnosis not present

## 2021-05-08 ENCOUNTER — Other Ambulatory Visit: Payer: Self-pay

## 2021-05-08 ENCOUNTER — Ambulatory Visit (INDEPENDENT_AMBULATORY_CARE_PROVIDER_SITE_OTHER): Payer: Medicare Other | Admitting: Family Medicine

## 2021-05-08 ENCOUNTER — Encounter: Payer: Self-pay | Admitting: Family Medicine

## 2021-05-08 VITALS — BP 123/83 | HR 75 | Temp 98.2°F | Wt 228.0 lb

## 2021-05-08 DIAGNOSIS — J441 Chronic obstructive pulmonary disease with (acute) exacerbation: Secondary | ICD-10-CM | POA: Diagnosis not present

## 2021-05-08 NOTE — Progress Notes (Signed)
? ?BP 123/83   Pulse 75   Temp 98.2 ?F (36.8 ?C)   Wt 228 lb (103.4 kg)   SpO2 97%   BMI 32.71 kg/m?   ? ?Subjective:  ? ? Patient ID: Kristopher [name], male    DOB: [birth year], 61 y.o.   MRN: [id number] ? ?HPI: ?Kristopher [name] is a 61 y.o. male ? ?Chief Complaint  ?Patient presents with  ? COPD  ?  Patient states he is feeling better, not coughing as much anymore   ? ?Cough is resolved. Feeling better. Only coughing occasionally when he lays down now. No other concerns.  ? ?Relevant past medical, surgical, family and social history reviewed and updated as indicated. Interim medical history since our last visit reviewed. ?Allergies and medications reviewed and updated. ? ?Review of Systems  ?Constitutional: Negative.   ?Respiratory: Negative.    ?Cardiovascular: Negative.   ?Gastrointestinal: Negative.   ?Musculoskeletal: Negative.   ?Psychiatric/Behavioral: Negative.    ? ?Per HPI unless specifically indicated above ? ?   ?Objective:  ?  ?BP 123/83   Pulse 75   Temp 98.2 ?F (36.8 ?C)   Wt 228 lb (103.4 kg)   SpO2 97%   BMI 32.71 kg/m?   ?Wt Readings from Last 3 Encounters:  ?05/08/21 228 lb (103.4 kg)  ?04/24/21 230 lb 3.2 oz (104.4 kg)  ?04/10/21 228 lb 6.4 oz (103.6 kg)  ?  ?Physical Exam ?Vitals and nursing note reviewed.  ?Constitutional:   ?   General: He is not in acute distress. ?   Appearance: Normal appearance. He is not ill-appearing, toxic-appearing or diaphoretic.  ?HENT:  ?   Head: Normocephalic and atraumatic.  ?   Right Ear: External ear normal.  ?   Left Ear: External ear normal.  ?   Nose: Nose normal.  ?   Mouth/Throat:  ?   Mouth: Mucous membranes are moist.  ?   Pharynx: Oropharynx is clear.  ?Eyes:  ?   General: No scleral icterus.    ?   Right eye: No discharge.     ?   Left eye: No discharge.  ?   Extraocular Movements: Extraocular movements intact.  ?   Conjunctiva/sclera: Conjunctivae normal.  ?   Pupils: Pupils are equal, round, and reactive to light.  ?Cardiovascular:  ?    Rate and Rhythm: Normal rate and regular rhythm.  ?   Pulses: Normal pulses.  ?   Heart sounds: Normal heart sounds. No murmur heard. ?  No friction rub. No gallop.  ?Pulmonary:  ?   Effort: Pulmonary effort is normal. No respiratory distress.  ?   Breath sounds: No stridor. Wheezing present. No rhonchi or rales.  ?Chest:  ?   Chest wall: No tenderness.  ?Musculoskeletal:     ?   General: Normal range of motion.  ?   Cervical back: Normal range of motion and neck supple.  ?Skin: ?   General: Skin is warm and dry.  ?   Capillary Refill: Capillary refill takes less than 2 seconds.  ?   Coloration: Skin is not jaundiced or pale.  ?   Findings: No bruising, erythema, lesion or rash.  ?Neurological:  ?   General: No focal deficit present.  ?   Mental Status: He is alert and oriented to person, place, and time. Mental status is at baseline.  ?Psychiatric:     ?   Mood and Affect: Mood normal.     ?  Behavior: Behavior normal.     ?   Thought Content: Thought content normal.     ?   Judgment: Judgment normal.  ? ? ?Results for orders placed or performed in visit on 04/10/21  ?Novel Coronavirus, NAA ([facility])  ? Specimen: Nasopharyngeal(NP) swabs in vial transport medium  ?Result Value Ref Range  ? SARS-CoV-2, NAA Detected (A) Not Detected  ?SARS-COV-2, NAA 2 DAY TAT  ?Result Value Ref Range  ? SARS-CoV-2, NAA 2 DAY TAT Performed   ?Influenza A & B (STAT)  ?Result Value Ref Range  ? Influenza A Negative Negative  ? Influenza B Negative Negative  ? ?   ?Assessment & Plan:  ? ?Problem List Items Addressed This Visit   ?None ?Visit Diagnoses   ? ? COPD exacerbation ([place])    -  Primary  ? Resolved. Continue to monitor. Call with any concerns.   ? ?  ?  ? ?Follow up plan: ?Return in about 4 months (around 09/07/2021). ? ? ? ? ? ?

## 2021-05-11 ENCOUNTER — Telehealth: Payer: Commercial Managed Care - HMO

## 2021-05-11 ENCOUNTER — Other Ambulatory Visit: Payer: Self-pay | Admitting: Family Medicine

## 2021-05-11 ENCOUNTER — Ambulatory Visit (INDEPENDENT_AMBULATORY_CARE_PROVIDER_SITE_OTHER): Payer: Medicare Other

## 2021-05-11 DIAGNOSIS — E1122 Type 2 diabetes mellitus with diabetic chronic kidney disease: Secondary | ICD-10-CM

## 2021-05-11 DIAGNOSIS — E1169 Type 2 diabetes mellitus with other specified complication: Secondary | ICD-10-CM

## 2021-05-11 DIAGNOSIS — C3492 Malignant neoplasm of unspecified part of left bronchus or lung: Secondary | ICD-10-CM

## 2021-05-11 DIAGNOSIS — I10 Essential (primary) hypertension: Secondary | ICD-10-CM

## 2021-05-11 DIAGNOSIS — C3490 Malignant neoplasm of unspecified part of unspecified bronchus or lung: Secondary | ICD-10-CM

## 2021-05-11 DIAGNOSIS — E785 Hyperlipidemia, unspecified: Secondary | ICD-10-CM

## 2021-05-11 DIAGNOSIS — J441 Chronic obstructive pulmonary disease with (acute) exacerbation: Secondary | ICD-10-CM

## 2021-05-11 MED ORDER — EMPAGLIFLOZIN 25 MG PO TABS: ORAL_TABLET | ORAL | 1 refills | Status: DC

## 2021-05-11 NOTE — Patient Instructions (Signed)
Visit Information  Thank you for taking time to visit with me today. Please don't hesitate to contact me if I can be of assistance to you before our next scheduled telephone appointment.  Following are the goals we discussed today:  RNCM Clinical Goal(s):  Patient will verbalize understanding of plan for management of HTN, DMII, and lung cancer  as evidenced by keeping appointments, following the plan of care, working with the CCM team to effectively manage health and well being  demonstrate understanding of rationale for each prescribed medication as evidenced by compliance with medications and calling for refills before running out of medications     attend all scheduled medical appointments:saw pcp on 05-08-2021, next appointment on 09-07-2021 at 10 am, as evidenced by keeping appointments and calling the office if appointment needs to be reschduled         demonstrate improved and ongoing adherence to prescribed treatment plan for HTN, DMII, and lung cancer  as evidenced by effective management of chronic conditions by working with the CCM team to optimize health and well being  demonstrate a decrease in HTN, DMII, and Lung cancer  exacerbations  as evidenced by effective management of chronic conditions  demonstrate ongoing self health care management ability for effective management of chronic conditions  as evidenced by working with the CCM team through collaboration with Consulting civil engineer, provider, and care team.    Interventions: 1:1 collaboration with primary care provider regarding development and update of comprehensive plan of care as evidenced by provider attestation and co-signature Inter-disciplinary care team collaboration (see longitudinal plan of care) Evaluation of current treatment plan related to  self management and patient's adherence to plan as established by provider     SDOH Barriers (Status: Goal on Track (progressing): YES.) Long Term Goal  Patient interviewed and SDOH  assessment performed        Patient interviewed and appropriate assessments performed Provided patient with information about resources available and care guides to assist with new needs or changes in SDOH Discussed plans with patient for ongoing care management follow up and provided patient with direct contact information for care management team Advised patient to call the office for changes in Drain, questions or concerns       Diabetes:  (Status: Goal on Track (progressing): YES.) Long Term Goal         Lab Results  Component Value Date    HGBA1C 6.8 (H) 03/08/2021  Slight increase from last A1C.  Assessed patient's understanding of A1c goal: <7% Provided education to patient about basic DM disease process; Reviewed medications with patient and discussed importance of medication adherence. 05-11-2021: The patient is compliant with medications. The patient denies any issues with DM medications. The patient states that he does need a refill for his jardiance 25 mg QD. Will send an in basket message requesting a refill for his jardiance.       Reviewed prescribed diet with patient heart healthy/ADA diet. 03-16-2021: The patient is compliant with heart healthy/ADA diet. The patient recently was on vacation in [place] and got back last week. He states he was eating some different things while away and his blood sugars went up slightly then. 05-11-2021: Review of heart healthy/ADA diet ; Counseled on importance of regular laboratory monitoring as prescribed. 05-11-2021: Review of regular lab work to check levels        Discussed plans with patient for ongoing care management follow up and provided patient with direct contact information for care  management team;      Provided patient with written educational materials related to hypo and hyperglycemia and importance of correct treatment. 03-16-2021: Denies any lows a this time. Did have a blood sugar up in 200's while on vacation but states it is  stable now that he is at home. 05-11-2021: Denies any highs or lows. States that his ranges have been normal;       Reviewed scheduled/upcoming provider appointments including: saw pcp on 05-08-2021, will see the pcp again on 09-07-2021 at 10 am Advised patient, providing education and rationale, to check cbg as directed  and record. 03-16-2021: The patient states that his blood sugar average has been 130 to 140. Did have one last week around 200. Denies any acute findings. 05-11-2021: States his range has been WNL. Denies any acute findings at this time. Will continue to monitor for changes.         call provider for findings outside established parameters;       Review of patient status, including review of consultants reports, relevant laboratory and other test results, and medications completed;       Screening for signs and symptoms of depression related to chronic disease state;        Assessed social determinant of health barriers;          Hypertension: (Status: Goal on Track (progressing): YES.) Last practice recorded BP readings:     BP Readings from Last 3 Encounters:  05/08/21 123/83  04/24/21 126/79  04/10/21 119/77  Most recent eGFR/CrCl:       Lab Results  Component Value Date    EGFR 93 03/08/2021    No components found for: CRCL   Evaluation of current treatment plan related to hypertension self management and patient's adherence to plan as established by provider. 05-11-2021: The patient is doing well and denies any issues with his HTN management or heart health;   Provided education to patient re: stroke prevention, s/s of heart attack and stroke; Reviewed prescribed diet heart healthy/ADA Reviewed medications with patient and discussed importance of compliance. 05-11-2021: Is compliant with his medications;  Discussed plans with patient for ongoing care management follow up and provided patient with direct contact information for care management team; Advised patient, providing  education and rationale, to monitor blood pressure daily and record, calling PCP for findings outside established parameters;  Provided education on prescribed diet heart healthy/ADA;  Discussed complications of poorly controlled blood pressure such as heart disease, stroke, circulatory complications, vision complications, kidney impairment, sexual dysfunction;      Lung cancer   (Status: Goal on Track (progressing): YES.) Long Term Goal  Evaluation of current treatment plan related to  Lung cancer  ,  self-management and patient's adherence to plan as established by provider. 03-16-2021:  The patient is currently not taking any treatments. He goes back in February for new scans and evaluation. At that point they will determine a course of action if any. 05-11-2021: The patient goes back next week to see the oncologist. Currently is not having any treatments and states his conditions is stable. The patient states he feels great and is doing well. Will know more after his scans if he has to take additional treatments or what the plan of care will be moving forward.  Discussed plans with patient for ongoing care management follow up and provided patient with direct contact information for care management team Advised patient to keep appointments, calling the office for changes in conditions, or  sx and sx of adverse reactions to chemotherapy treatment for lung cancer ; Provided education to patient re: sx and sx to monitor for changes, reporting increased fatigue, nausea, or pain, maintaining nutrition and utilization of support system ; Reviewed medications with patient and discussed compliance ; Reviewed scheduled/upcoming provider appointments including February 2023 for repeat scan and appointment with oncologist; Discussed plans with patient for ongoing care management follow up and provided patient with direct contact information for care management team; Advised patient to discuss changes in disease  progression with provider;      Skin tag/skin lesion to chest and back   (Status: Goal Met.) Short Term Goal 03-16-2021: The patient saw Dr. Wynetta Emery on 03-12-2021 and had the skin tags removed. The patient is doing well and denies any acute distress. Said the felt better as soon as she finished and she did a good job. Denies any concerns or issues related to skin tag/lesion. Evaluation of current treatment plan related to  acute onset of skin tag or lesion to back and chest ,  self-management and patient's adherence to plan as established by provider. Discussed plans with patient for ongoing care management follow up and provided patient with direct contact information for care management team Advised patient to call the office and secure an appointment for follow up on skin tag or lession to mid back and chest. The patient states it is an area that is sore and the cancer center states that it is something the pcp needs to evaluate; Provided education to patient re: sx and sx of infection, calling the office for changes, and monitoring for changes in the 2 areas of concern; Collaborated with pcp regarding new onset of skin tags/lesion to back and chest; Reviewed scheduled/upcoming provider appointments including 03-06-2021, advised the patient he may need a sooner appointment to call and set up and appointment to see the pcp; Discussed plans with patient for ongoing care management follow up and provided patient with direct contact information for care management team;      COPD: (Status: Goal on Track (progressing): YES.) Long Term Goal  Reviewed medications with patient, including use of prescribed maintenance and rescue inhalers, and provided instruction on medication management and the importance of adherence. 05-11-2021: The patient states that the nebulizer treatments he has been having have been effective in the management of his COPD exacerbation. He says the treatments each morning really open him  up. He says he only has a small amount of wheezing right now. States he feels so much better.  Provided patient with basic written and verbal COPD education on self care/management/and exacerbation prevention Advised patient to track and manage COPD triggers. 05-11-2021: Review of factors that cause exacerbation or changes that could cause the patient to have triggers in his condition. Did discuss upcoming weather changes.  Provided written and verbal instructions on pursed lip breathing and utilized returned demonstration as teach back Provided instruction about proper use of medications used for management of COPD including inhalers Advised patient to self assesses COPD action plan zone and make appointment with provider if in the yellow zone for 48 hours without improvement Advised patient to engage in light exercise as tolerated 3-5 days a week to aid in the the management of COPD Provided education about and advised patient to utilize infection prevention strategies to reduce risk of respiratory infection Discussed the importance of adequate rest and management of fatigue with COPD. 05-11-2021: Review and education given. Will continue to monitor.  Patient Goals/Self-Care Activities: Patient will self administer medications as prescribed as evidenced by self report/primary caregiver report  Patient will attend all scheduled provider appointments as evidenced by clinician review of documented attendance to scheduled appointments and patient/caregiver report Patient will call pharmacy for medication refills as evidenced by patient report and review of pharmacy fill history as appropriate Patient will attend church or other social activities as evidenced by patient report Patient will continue to perform ADL's independently as evidenced by patient/caregiver report Patient will continue to perform IADL's independently as evidenced by patient/caregiver report Patient will call provider office for  new concerns or questions as evidenced by review of documented incoming telephone call notes and patient report Patient will work with BSW to address care coordination needs and will continue to work with the clinical team to address health care and disease management related needs as evidenced by documented adherence to scheduled care management/care coordination appointments schedule appointment with eye doctor check blood sugar at prescribed times: when you have symptoms of low or high blood sugar, before and after exercise, and as directed   check feet daily for cuts, sores or redness enter blood sugar readings and medication or insulin into daily log take the blood sugar log to all doctor visits trim toenails straight across drink 6 to 8 glasses of water each day eat fish at least once per week fill half of plate with vegetables limit fast food meals to no more than 1 per week manage portion size prepare main meal at home 3 to 5 days each week read food labels for fat, fiber, carbohydrates and portion size reduce red meat to 2 to 3 times a week set a realistic goal keep feet up while sitting wash and dry feet carefully every day wear comfortable, cotton socks wear comfortable, well-fitting shoes - check blood pressure weekly - choose a place to take my blood pressure (home, clinic or office, retail store) - learn about high blood pressure - keep a blood pressure log - take blood pressure log to all doctor appointments - call doctor for signs and symptoms of high blood pressure - develop an action plan for high blood pressure - keep all doctor appointments - take medications for blood pressure exactly as prescribed - report new symptoms to your doctor - eat more whole grains, fruits and vegetables, lean meats and healthy fats    Our next appointment is by telephone on 07-06-2021 at 230 pm  Please call the care guide team at [phone] if you need to cancel or reschedule your  appointment.   If you are experiencing a Mental Health or [place] or need someone to talk to, please call the Suicide and Crisis Lifeline: 988 call the [place] National Suicide Prevention Lifeline: (306)816-2237 or TTY: (626) 163-4539 TTY 657-111-9491) to talk to a trained counselor call 1-800-273-TALK (toll free, 24 hour hotline)   Patient verbalizes understanding of instructions and care plan provided today and agrees to view in [place]. Active MyChart status confirmed with patient.    Noreene Larsson RN, MSN, [facility] Mobile: [phone]

## 2021-05-11 NOTE — Chronic Care Management (AMB) (Signed)
Chronic Care Management   CCM RN Visit Note  05/11/2021 Name: Kristopher [name] MRN: [id number] DOB: 22-Jun-1960  Subjective: Kristopher [name] is a 61 y.o. year old male who is a primary care patient of [name], DO. The care management team was consulted for assistance with disease management and care coordination needs.    Engaged with patient by telephone for follow up visit in response to provider referral for case management and/or care coordination services.   Consent to Services:  The patient was given information about Chronic Care Management services, agreed to services, and gave verbal consent prior to initiation of services.  Please see initial visit note for detailed documentation.   Patient agreed to services and verbal consent obtained.   Assessment: Review of patient past medical history, allergies, medications, health status, including review of consultants reports, laboratory and other test data, was performed as part of comprehensive evaluation and provision of chronic care management services.   SDOH (Social Determinants of Health) assessments and interventions performed:    CCM Care Plan  Allergies  Allergen Reactions   Acetaminophen Swelling   Aspirin Anaphylaxis   Epinephrine Anaphylaxis    Does not include albuterol   Novocain [Procaine] Anaphylaxis   Penicillins Anaphylaxis    Has patient had a PCN reaction causing immediate rash, facial/tongue/throat swelling, SOB or lightheadedness with hypotension: Yes Has patient had a PCN reaction causing severe rash involving mucus membranes or skin necrosis: No Has patient had a PCN reaction that required hospitalization: Yes Has patient had a PCN reaction occurring within the last 10 years: No If all of the above answers are "NO", then may proceed with Cephalosporin use.    Strawberry Extract Anaphylaxis   Shellfish Allergy Hives and Nausea And Vomiting    Outpatient Encounter Medications as of 05/11/2021   Medication Sig Note   Accu-Chek FastClix Lancets MISC USE TO TEST BLOOD SUGAR 2X A DAY    albuterol (PROVENTIL) (2.5 MG/3ML) 0.083% nebulizer solution Take 3 mLs (2.5 mg total) by nebulization every 6 (six) hours as needed for wheezing or shortness of breath.    albuterol (VENTOLIN HFA) 108 (90 Base) MCG/ACT inhaler Inhale into the lungs.    amLODipine (NORVASC) 5 MG tablet TAKE 1 TABLET(5 MG) BY MOUTH TWICE DAILY    atorvastatin (LIPITOR) 10 MG tablet Take 1 tablet (10 mg total) by mouth daily at 6 PM.    BREZTRI AEROSPHERE 160-9-4.8 MCG/ACT AERO Inhale 2 puffs into the lungs 2 (two) times daily. Maintenance per pulmonology    diphenhydrAMINE (BENADRYL) 25 MG tablet Take 50 mg by mouth daily.    esomeprazole (NEXIUM) 40 MG capsule Take 1 capsule (40 mg total) by mouth daily.    glucose blood (ACCU-CHEK GUIDE) test strip USE TO TEST BLOOD SUGAR 2X A DAY    hydrALAZINE (APRESOLINE) 100 MG tablet TAKE 1 TABLET(100 MG) BY MOUTH TWICE DAILY    JARDIANCE 25 MG TABS tablet TAKE 1 TABLET(25 MG) BY MOUTH DAILY BEFORE BREAKFAST    lidocaine (LIDODERM) 5 % Place 1 patch onto the skin daily. Remove & Discard patch within 12 hours or as directed by MD    metFORMIN (GLUCOPHAGE) 500 MG tablet Take 2 tablets (1,000 mg total) by mouth 2 (two) times daily with a meal.    metoprolol succinate (TOPROL-XL) 100 MG 24 hr tablet Take 1 tablet (100 mg total) by mouth 2 (two) times daily. Take with or immediately following a meal. To be taken with the 70m  metoprolol tartrate    metoprolol tartrate (LOPRESSOR) 50 MG tablet Take 1 tablet (50 mg total) by mouth 2 (two) times daily. To be taken with the 172m metorprolol succinate.    montelukast (SINGULAIR) 10 MG tablet Take 10 mg by mouth daily.    morphine (MSIR) 15 MG tablet Take 1 tablet (15 mg total) by mouth every 6 (six) hours as needed for moderate pain or severe pain. Must last 30 days.    morphine (MSIR) 15 MG tablet Take 1 tablet (15 mg total) by mouth every 6  (six) hours as needed for moderate pain or severe pain. Must last 30 days.    morphine (MSIR) 15 MG tablet Take 1 tablet (15 mg total) by mouth every 6 (six) hours as needed for moderate pain or severe pain. Must last 30 days. 02/14/2021: WARNING: Not a Duplicate. Future prescription. DO NOT DELETE during hospital medication reconciliation or at discharge. [facility] Chronic [facility] Management Patient    Multiple Vitamin (MULTIVITAMIN WITH MINERALS) TABS tablet Take 1 tablet by mouth daily.    NONFORMULARY OR COMPOUNDED ITEM Apply 1-2 mLs topically 4 (four) times daily as needed. 10% Ketamine/2% Cyclobenzaprine/6% Gabapentin Cream 11/20/2020: DO NOT DELETE during hospital medication reconciliation or at discharge. [facility] Chronic Pain Management Patient    nortriptyline (PAMELOR) 25 MG capsule Take 2 capsules (50 mg total) by mouth at bedtime.    Potassium Chloride ER 20 MEQ TBCR Take 1 tablet by mouth daily.    STIOLTO RESPIMAT 2.5-2.5 MCG/ACT AERS Inhale 2 puffs into the lungs 4 (four) times daily as needed. Prn per pulmonology notes    SUMAtriptan (IMITREX) 50 MG tablet Take 1 tablet (50 mg total) by mouth every 2 (two) hours as needed for migraine. Take 1 tab at onset of migraine. May repeat in 2 hours if headache persists or recurs.    Facility-Administered Encounter Medications as of 05/11/2021  Medication   0.9 %  sodium chloride infusion   sodium chloride flush (NS) 0.9 % injection 10 mL    Patient Active Problem List   Diagnosis Date Noted   Sinus congestion 04/10/2021   Chronic use of opiate for therapeutic purpose 08/20/2020   Malignant ne[place] of lung ([place]    Goals of care, counseling/discussion 11/01/2019   Pulmonary emphysema ([facility] 08/25/2019   OSA (obstructive sleep apnea) 06/16/2019   History of colonic polyps 02/02/2019   Shortness of breath 02/02/2019   Chronic upper extremity pain (3ry area of Pain) (Bilateral) (L>R) 01/19/2019   Squamous cell carcinoma of left lung ([facility]  01/16/2019   Lung mass 01/14/2019   Left lower lobe pulmonary nodule 10/26/2018   History of allergy to ester type local anesthetic (procaine) 10/21/2018    Class: History of   Hyperlipidemia associated with type 2 diabetes mellitus ([facility] 07/20/2018   Type 2 diabetes mellitus with stage 1 chronic kidney disease, without long-term current use of insulin ([place] 12/30/2017   S/P hip replacement 12/02/2017   Hip arthritis 10/01/2017   Aortic atherosclerosis ([facility] 08/25/2017   Left-sided weakness 08/13/2017   Musculoskeletal pain, chronic 04/03/2017   History of tobacco abuse 06/24/2016   GERD (gastroesophageal reflux disease) 06/24/2016   Tobacco abuse 06/24/2016   Pharmacologic therapy    Benign neoplasm of ascending colon    Polyp of sigmoid colon    Rectal polyp    Benign hypertensive renal disease 01/22/2016   Migraine 01/22/2016   Chronic pain syndrome 01/11/2016   Chronic sacroiliac joint pain (Bilateral) (L>R) 05/31/2015  Chronic hip pain (Left) 05/31/2015   Lumbar facet syndrome (Bilateral) (L>R) 05/31/2015   Chronic lower extremity pain (2ry area of Pain) (Left) 05/31/2015   Greater occipital neuralgia (Right) 05/31/2015   Retrolisthesis of L5-S1 02/06/2015   Cervical disc herniation (C4-5 and C5-6) 02/06/2015   Lumbar disc herniation (L5-S1) 02/06/2015   Hypokalemia 02/01/2015   Cervical spinal stenosis (C4-5) 01/06/2015   Cervical foraminal stenosis (Bilateral C5-6) 01/06/2015   Chronic low back pain (1ry area of Pain) (Bilateral) (L>R) 01/05/2015   Lumbar spondylosis 01/05/2015   Chronic lumbar radicular pain (S1 dermatomal) (Left) 01/05/2015   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) 01/05/2015   Chronic neck pain (posterior midline) (Bilateral) (L>R) 01/05/2015   Cervical spondylosis 01/05/2015   Chronic cervical radicular pain (Bilateral) (C5/C6 dermatome) (L>R) 01/05/2015   Long term current use of opiate analgesic 01/05/2015   Long term  prescription opiate use 01/05/2015   Opiate use (60 MME/Day) 60/63/0160   Uncomplicated opioid dependence ([place]) 01/05/2015    Conditions to be addressed/monitored:HTN, COPD, DMII, and lung cancer  Care Plan : RNCM: General Plan of Care (Adult) for Chronic Disease Management and Care Coordination Needs  Updates made by Vanita Ingles, RN since 05/11/2021 12:00 AM     Problem: RNCM: Development of Plan of Care for Chronic Disease Management (DM, HTN, Lung Cancer)   Priority: High     Long-Range Goal: RNCM: Effective Management  of Plan of Care for Chronic Disease Management (DM, HTN, Lung Cancer)   Start Date: 01/12/2021  Expected End Date: 01/12/2022  Priority: High  Note:   Current Barriers:  Knowledge Deficits related to plan of care for management of HTN, DMII, and Lung cancer  Chronic Disease Management support and education needs related to HTN, DMII, and Lung cancer   RNCM Clinical Goal(s):  Patient will verbalize understanding of plan for management of HTN, DMII, and lung cancer  as evidenced by keeping appointments, following the plan of care, working with the CCM team to effectively manage health and well being  demonstrate understanding of rationale for each prescribed medication as evidenced by compliance with medications and calling for refills before running out of medications     attend all scheduled medical appointments:saw pcp on 05-08-2021, next appointment on 09-07-2021 at 10 am, as evidenced by keeping appointments and calling the office if appointment needs to be reschduled         demonstrate improved and ongoing adherence to prescribed treatment plan for HTN, DMII, and lung cancer  as evidenced by effective management of chronic conditions by working with the CCM team to optimize health and well being  demonstrate a decrease in HTN, DMII, and Lung cancer  exacerbations  as evidenced by effective management of chronic conditions  demonstrate ongoing self health care  management ability for effective management of chronic conditions  as evidenced by working with the CCM team through collaboration with Consulting civil engineer, provider, and care team.   Interventions: 1:1 collaboration with primary care provider regarding development and update of comprehensive plan of care as evidenced by provider attestation and co-signature Inter-disciplinary care team collaboration (see longitudinal plan of care) Evaluation of current treatment plan related to  self management and patient's adherence to plan as established by provider   SDOH Barriers (Status: Goal on Track (progressing): YES.) Long Term Goal  Patient interviewed and SDOH assessment performed        Patient interviewed and appropriate assessments performed Provided patient with information about resources  available and care guides to assist with new needs or changes in SDOH Discussed plans with patient for ongoing care management follow up and provided patient with direct contact information for care management team Advised patient to call the office for changes in SDOH, questions or concerns    Diabetes:  (Status: Goal on Track (progressing): YES.) Long Term Goal   Lab Results  Component Value Date   HGBA1C 6.8 (H) 03/08/2021  Slight increase from last A1C.  Assessed patient's understanding of A1c goal: <7% Provided education to patient about basic DM disease process; Reviewed medications with patient and discussed importance of medication adherence. 05-11-2021: The patient is compliant with medications. The patient denies any issues with DM medications. The patient states that he does need a refill for his jardiance 25 mg QD. Will send an in basket message requesting a refill for his jardiance.       Reviewed prescribed diet with patient heart healthy/ADA diet. 03-16-2021: The patient is compliant with heart healthy/ADA diet. The patient recently was on vacation in [place] and got back last week. He states he  was eating some different things while away and his blood sugars went up slightly then. 05-11-2021: Review of heart healthy/ADA diet ; Counseled on importance of regular laboratory monitoring as prescribed. 05-11-2021: Review of regular lab work to check levels        Discussed plans with patient for ongoing care management follow up and provided patient with direct contact information for care management team;      Provided patient with written educational materials related to hypo and hyperglycemia and importance of correct treatment. 03-16-2021: Denies any lows a this time. Did have a blood sugar up in 200's while on vacation but states it is stable now that he is at home. 05-11-2021: Denies any highs or lows. States that his ranges have been normal;       Reviewed scheduled/upcoming provider appointments including: saw pcp on 05-08-2021, will see the pcp again on 09-07-2021 at 10 am Advised patient, providing education and rationale, to check cbg as directed  and record. 03-16-2021: The patient states that his blood sugar average has been 130 to 140. Did have one last week around 200. Denies any acute findings. 05-11-2021: States his range has been WNL. Denies any acute findings at this time. Will continue to monitor for changes.         call provider for findings outside established parameters;       Review of patient status, including review of consultants reports, relevant laboratory and other test results, and medications completed;       Screening for signs and symptoms of depression related to chronic disease state;        Assessed social determinant of health barriers;         Hypertension: (Status: Goal on Track (progressing): YES.) Last practice recorded BP readings:  BP Readings from Last 3 Encounters:  05/08/21 123/83  04/24/21 126/79  04/10/21 119/77  Most recent eGFR/CrCl:  Lab Results  Component Value Date   EGFR 93 03/08/2021    No components found for: CRCL  Evaluation of current  treatment plan related to hypertension self management and patient's adherence to plan as established by provider. 05-11-2021: The patient is doing well and denies any issues with his HTN management or heart health;   Provided education to patient re: stroke prevention, s/s of heart attack and stroke; Reviewed prescribed diet heart healthy/ADA Reviewed medications with patient and discussed importance  of compliance. 05-11-2021: Is compliant with his medications;  Discussed plans with patient for ongoing care management follow up and provided patient with direct contact information for care management team; Advised patient, providing education and rationale, to monitor blood pressure daily and record, calling PCP for findings outside established parameters;  Provided education on prescribed diet heart healthy/ADA;  Discussed complications of poorly controlled blood pressure such as heart disease, stroke, circulatory complications, vision complications, kidney impairment, sexual dysfunction;    Lung cancer   (Status: Goal on Track (progressing): YES.) Long Term Goal  Evaluation of current treatment plan related to  Lung cancer  ,  self-management and patient's adherence to plan as established by provider. 03-16-2021:  The patient is currently not taking any treatments. He goes back in February for new scans and evaluation. At that point they will determine a course of action if any. 05-11-2021: The patient goes back next week to see the oncologist. Currently is not having any treatments and states his conditions is stable. The patient states he feels great and is doing well. Will know more after his scans if he has to take additional treatments or what the plan of care will be moving forward.  Discussed plans with patient for ongoing care management follow up and provided patient with direct contact information for care management team Advised patient to keep appointments, calling the office for changes in  conditions, or sx and sx of adverse reactions to chemotherapy treatment for lung cancer ; Provided education to patient re: sx and sx to monitor for changes, reporting increased fatigue, nausea, or pain, maintaining nutrition and utilization of support system ; Reviewed medications with patient and discussed compliance ; Reviewed scheduled/upcoming provider appointments including February 2023 for repeat scan and appointment with oncologist; Discussed plans with patient for ongoing care management follow up and provided patient with direct contact information for care management team; Advised patient to discuss changes in disease progression with provider;    Skin tag/skin lesion to chest and back   (Status: Goal Met.) Short Term Goal 03-16-2021: The patient saw Dr. Wynetta Emery on 03-12-2021 and had the skin tags removed. The patient is doing well and denies any acute distress. Said the felt better as soon as she finished and she did a good job. Denies any concerns or issues related to skin tag/lesion. Evaluation of current treatment plan related to  acute onset of skin tag or lesion to back and chest ,  self-management and patient's adherence to plan as established by provider. Discussed plans with patient for ongoing care management follow up and provided patient with direct contact information for care management team Advised patient to call the office and secure an appointment for follow up on skin tag or lession to mid back and chest. The patient states it is an area that is sore and the cancer center states that it is something the pcp needs to evaluate; Provided education to patient re: sx and sx of infection, calling the office for changes, and monitoring for changes in the 2 areas of concern; Collaborated with pcp regarding new onset of skin tags/lesion to back and chest; Reviewed scheduled/upcoming provider appointments including 03-06-2021, advised the patient he may need a sooner appointment to call  and set up and appointment to see the pcp; Discussed plans with patient for ongoing care management follow up and provided patient with direct contact information for care management team;    COPD: (Status: Goal on Track (progressing): YES.) Long Term Goal  Reviewed medications with patient, including use of prescribed maintenance and rescue inhalers, and provided instruction on medication management and the importance of adherence. 05-11-2021: The patient states that the nebulizer treatments he has been having have been effective in the management of his COPD exacerbation. He says the treatments each morning really open him up. He says he only has a small amount of wheezing right now. States he feels so much better.  Provided patient with basic written and verbal COPD education on self care/management/and exacerbation prevention Advised patient to track and manage COPD triggers. 05-11-2021: Review of factors that cause exacerbation or changes that could cause the patient to have triggers in his condition. Did discuss upcoming weather changes.  Provided written and verbal instructions on pursed lip breathing and utilized returned demonstration as teach back Provided instruction about proper use of medications used for management of COPD including inhalers Advised patient to self assesses COPD action plan zone and make appointment with provider if in the yellow zone for 48 hours without improvement Advised patient to engage in light exercise as tolerated 3-5 days a week to aid in the the management of COPD Provided education about and advised patient to utilize infection prevention strategies to reduce risk of respiratory infection Discussed the importance of adequate rest and management of fatigue with COPD. 05-11-2021: Review and education given. Will continue to monitor.    Patient Goals/Self-Care Activities: Patient will self administer medications as prescribed as evidenced by self report/primary  caregiver report  Patient will attend all scheduled provider appointments as evidenced by clinician review of documented attendance to scheduled appointments and patient/caregiver report Patient will call pharmacy for medication refills as evidenced by patient report and review of pharmacy fill history as appropriate Patient will attend church or other social activities as evidenced by patient report Patient will continue to perform ADL's independently as evidenced by patient/caregiver report Patient will continue to perform IADL's independently as evidenced by patient/caregiver report Patient will call provider office for new concerns or questions as evidenced by review of documented incoming telephone call notes and patient report Patient will work with BSW to address care coordination needs and will continue to work with the clinical team to address health care and disease management related needs as evidenced by documented adherence to scheduled care management/care coordination appointments schedule appointment with eye doctor check blood sugar at prescribed times: when you have symptoms of low or high blood sugar, before and after exercise, and as directed   check feet daily for cuts, sores or redness enter blood sugar readings and medication or insulin into daily log take the blood sugar log to all doctor visits trim toenails straight across drink 6 to 8 glasses of water each day eat fish at least once per week fill half of plate with vegetables limit fast food meals to no more than 1 per week manage portion size prepare main meal at home 3 to 5 days each week read food labels for fat, fiber, carbohydrates and portion size reduce red meat to 2 to 3 times a week set a realistic goal keep feet up while sitting wash and dry feet carefully every day wear comfortable, cotton socks wear comfortable, well-fitting shoes - check blood pressure weekly - choose a place to take my blood  pressure (home, clinic or office, retail store) - learn about high blood pressure - keep a blood pressure log - take blood pressure log to all doctor appointments - call doctor for signs and symptoms of  high blood pressure - develop an action plan for high blood pressure - keep all doctor appointments - take medications for blood pressure exactly as prescribed - report new symptoms to your doctor - eat more whole grains, fruits and vegetables, lean meats and healthy fats       Plan:Telephone follow up appointment with care management team member scheduled for:  07-06-2021 at 230 pm  [place], MSN, [facility] Mobile: [phone]

## 2021-05-13 NOTE — Progress Notes (Signed)
PROVIDER NOTE: Information contained herein reflects review and annotations entered in association with encounter. Interpretation of such information and data should be left to medically-trained personnel. Information provided to patient can be located elsewhere in the medical record under "Patient Instructions". Document created using STT-dictation technology, any transcriptional errors that may result from process are unintentional.    Patient: Kristopher [name]  Service Category: E/M  Provider: Gaspar Cola, MD  DOB: 1960/05/28  DOS: 05/14/2021  Specialty: Interventional Pain Management  MRN: [id number]  Setting: Ambulatory outpatient  PCP: [name], DO  Type: Established Patient    Referring Provider: [name], DO  Location: Office  Delivery: Face-to-face     HPI  [name], a 61 y.o. year old male, is here today because of his Chronic pain syndrome [G89.4]. Mr. Godshall primary complain today is Back Pain (Lumbar mostly on the left ) Last encounter: My last encounter with him was on 02/14/2021. Pertinent problems: Mr. Cullen has Chronic low back pain (1ry area of Pain) (Bilateral) (L>R); Lumbar spondylosis; Chronic lumbar radicular pain (S1 dermatomal) (Left); 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); Chronic neck pain (posterior midline) (Bilateral) (L>R); Cervical spondylosis; Chronic cervical radicular pain (Bilateral) (C5/C6 dermatome) (L>R); Cervical spinal stenosis (C4-5); Cervical foraminal stenosis (Bilateral C5-6); Retrolisthesis of L5-S1; Cervical disc herniation (C4-5 and C5-6); Lumbar disc herniation (L5-S1); Chronic sacroiliac joint pain (Bilateral) (L>R); Chronic hip pain (Left); Lumbar facet syndrome (Bilateral) (L>R); Chronic lower extremity pain (2ry area of Pain) (Left); Greater occipital neuralgia (Right); Chronic pain syndrome; Migraine; Musculoskeletal pain, chronic; Left-sided weakness; Hip arthritis; S/P hip replacement;  Squamous cell carcinoma of left lung (HCC); and Chronic upper extremity pain (3ry area of Pain) (Bilateral) (L>R) on their pertinent problem list. Pain Assessment: Severity of Chronic pain is reported as a 3 /10. Location: Back Lower, Left, Right/sometimes down the left leg, 1/2 way and sometimes all the way to the foot.. Onset: More than a month ago. Quality: Discomfort, Constant, Stabbing, Penetrating. Timing: Constant. Modifying factor(s): medications, topical analgesics. Vitals:  height is _0  (1.778 m) and weight is 228 lb (103.4 kg). His temporal temperature is 97.2 F (36.2 C) (abnormal). His blood pressure is 135/94 (abnormal) and his pulse is 78. His respiration is 16 and oxygen saturation is 99%.   Reason for encounter: medication management.   The patient indicates doing well with the current medication regimen. No adverse reactions or side effects reported to the medications.  Patient is currently in the middle of cancer therapy for recently discovered lung cancer.  The patient again as dose for a prescription for his compounding cream.  Every time he comes in he would last for the same, but we have notified him that they compounding pharmacy will honor the PRN prescription sent on 11/20/2020 to last until 11/20/2021.  They have additional refills on file, but the patient is not calling them to check on this before he comes in to request another refill.  UDS ordered today.   RTCB: 08/17/2021 (11/20/2021 for compounded cream)  Pharmacotherapy Assessment  Analgesic: Morphine IR 15 mg, 1 tablet p.o. every 6 hours (60 mg/day of morphine) (60 MME) + Compounded Specialty Analgesic Cream (w/ Ketamine) MME/day: 60 mg/day.   Monitoring: [place] PMP: PDMP reviewed during this encounter.       Pharmacotherapy: No side-effects or adverse reactions reported. Compliance: No problems identified. Effectiveness: Clinically acceptable.  Janett Billow, RN  05/14/2021  8:40 AM  Sign when Signing  Visit Nursing Pain Medication Assessment:  Safety precautions to be maintained throughout the outpatient stay will include: orient to surroundings, keep bed in low position, maintain call bell within reach at all times, provide assistance with transfer out of bed and ambulation.  Medication Inspection Compliance: Pill count conducted under aseptic conditions, in front of the patient. Neither the pills nor the bottle was removed from the patient's sight at any time. Once count was completed pills were immediately returned to the patient in their original bottle.  Medication: Morphine IR Pill/Patch Count:  16 of 120 pills remain Pill/Patch Appearance: Markings consistent with prescribed medication Bottle Appearance: Standard pharmacy container. Clearly labeled. Filled Date: 02 / 15 / 2023 Last Medication intake:  Today    UDS:  Summary  Date Value Ref Range Status  05/22/2020 Note  Final    Comment:    ==================================================================== ToxASSURE Select 13 (MW) ==================================================================== Test                             Result       Flag       Units  Drug Present and Declared for Prescription Verification   Morphine                       >9259        EXPECTED   ng/mg creat   Normorphine                    92           EXPECTED   ng/mg creat    Potential sources of large amounts of morphine in the absence of    codeine include administration of morphine or use of heroin.     Normorphine is an expected metabolite of morphine.  ==================================================================== Test                      Result    Flag   Units      Ref Range   Creatinine              108              mg/dL      >=20 ==================================================================== Declared Medications:  The flagging and interpretation on this report are based on the  following declared medications.   Unexpected results may arise from  inaccuracies in the declared medications.   **Note: The testing scope of this panel includes these medications:   Morphine (MSIR)   **Note: The testing scope of this panel does not include the  following reported medications:   Albuterol (Ventolin HFA)  Amlodipine (Norvasc)  Atorvastatin (Lipitor)  Budesonide  Diphenhydramine (Benadryl)  Empagliflozin (Jardiance)  Esomeprazole (Nexium)  Fluticasone  Formoterol  Glycopyrrolate  Hydralazine (Apresoline)  Metformin (Glucophage)  Metoprolol (Lopressor)  Montelukast (Singulair)  Multivitamin  Nortriptyline (Pamelor)  Olodaterol (Stiolto Respimat)  Pantoprazole (Protonix)  Potassium  Prilocaine (EMLA)  Sumatriptan (Imitrex)  Tiotropium (Stiolto Respimat)  Topical Cyclobenzaprine  Topical Gabapentin  Topical Ketamine  Topical Lidocaine (Lidoderm)  Topical Lidocaine (EMLA) ==================================================================== For clinical consultation, please call [phone]. ====================================================================      ROS  Constitutional: Denies any fever or chills Gastrointestinal: No reported hemesis, hematochezia, vomiting, or acute GI distress Musculoskeletal: Denies any acute onset joint swelling, redness, loss of ROM, or weakness Neurological: No reported episodes of acute onset apraxia, aphasia, dysarthria, agnosia, amnesia, paralysis,  loss of coordination, or loss of consciousness  Medication Review  Accu-Chek FastClix Lancets, Budeson-Glycopyrrol-Formoterol, NONFORMULARY OR COMPOUNDED ITEM, Potassium Chloride ER, SUMAtriptan, Tiotropium Bromide-Olodaterol, albuterol, amLODipine, atorvastatin, diphenhydrAMINE, empagliflozin, esomeprazole, glucose blood, hydrALAZINE, lidocaine, metFORMIN, metoprolol succinate, metoprolol tartrate, montelukast, morphine, multivitamin with minerals, and nortriptyline  History Review  Allergy: Mr.  Casagrande is allergic to acetaminophen, aspirin, epinephrine, novocain [procaine], penicillins, strawberry extract, and shellfish allergy. Drug: Mr. Hanawalt  reports current drug use. Drug: Morphine. Alcohol:  reports no history of alcohol use. Tobacco:  reports that he quit smoking about 2 years ago. His smoking use included cigarettes. He has a 8.75 pack-year smoking history. He has never used smokeless tobacco. Social: Mr. Neilan  reports that he quit smoking about 2 years ago. His smoking use included cigarettes. He has a 8.75 pack-year smoking history. He has never used smokeless tobacco. He reports current drug use. Drug: Morphine. He reports that he does not drink alcohol. Medical:  has a past medical history of Allergy, Arthritis, Benign hypertensive kidney disease, Chronic back pain, Diabetes mellitus, type 2 ([place]), Dyspnea, GERD (gastroesophageal reflux disease), Hypertension, Malignant neoplasm of lung ([place]), and Migraines. Surgical: Mr. Mckoy  has a past surgical history that includes Back surgery; Appendectomy; Leg Surgery; Knee surgery (Left); Foot surgery (Left); Colonoscopy with propofol (N/A, 02/19/2016); polypectomy (N/A, 02/19/2016); Total hip arthroplasty (Left, 12/02/2017); Colonoscopy with propofol (N/A, 01/19/2018); polypectomy (01/19/2018); DG OPERATIVE LEFT HIP ([place] HX); Joint replacement (Left, 12/2017); Video bronchoscopy (Left, 01/14/2019); Thoracotomy (Left, 01/14/2019); Flexible bronchoscopy (Bilateral, 01/20/2019); Flexible bronchoscopy (Bilateral, 01/22/2019); Electormagnetic navigation bronchoscopy (Left, 11/18/2018); Lung cancer surgery; Video bronchoscopy with endobronchial ultrasound (N/A, 10/15/2019); Video bronchoscopy with endobronchial navigation (N/A, 10/15/2019); Portacath placement (N/A, 11/11/2019); Colonoscopy with propofol (N/A, 03/16/2021); and Colonoscopy with propofol (N/A, 04/06/2021). Family: family history includes Cancer in his father; Diabetes in his sister;  Thrombosis in his sister.  Laboratory Chemistry Profile   Renal Lab Results  Component Value Date   BUN 7 03/08/2021   CREATININE 0.94 03/08/2021   BCR 7 (L) 03/08/2021   GFRAA >60 12/01/2019   GFRNONAA >60 01/03/2021    Hepatic Lab Results  Component Value Date   AST 51 (H) 03/08/2021   ALT 66 (H) 03/08/2021   ALBUMIN 4.8 03/08/2021   ALKPHOS 155 (H) 03/08/2021   LIPASE 50 02/17/2019    Electrolytes Lab Results  Component Value Date   NA 141 03/08/2021   K 3.9 03/08/2021   CL 101 03/08/2021   CALCIUM 9.7 03/08/2021   MG 2.4 01/25/2019   PHOS 3.5 01/23/2019    Bone No results found for: VD25OH, VD125OH2TOT, QQ2297LG9, QJ1941DE0, 25OHVITD1, 25OHVITD2, 25OHVITD3, TESTOFREE, TESTOSTERONE  Inflammation (CRP: Acute Phase) (ESR: Chronic Phase) Lab Results  Component Value Date   CRP 0.7 04/03/2015   ESRSEDRATE 11 10/21/2017   LATICACIDVEN 1.7 01/16/2019         Note: Above Lab results reviewed.  Recent Imaging Review  DG Chest 2 View CLINICAL DATA:  Cough for 4 days with wheezing and rhonchi.  EXAM: CHEST - 2 VIEW  COMPARISON:  May 11, 2020  FINDINGS: Stable left Port-A-Cath terminating in the SVC. The heart, hila, and mediastinum are unchanged. No nodules or masses. No focal infiltrates. No cause for the patient's cough identified.  IMPRESSION: No active cardiopulmonary disease.  Electronically Signed   By: Dorise Bullion III M.D.   On: 04/11/2021 06:25 Note: Reviewed        Physical Exam  General appearance: Well nourished, well developed, and well hydrated. In  no apparent acute distress Mental status: Alert, oriented x 3 (person, place, & time)       Respiratory: No evidence of acute respiratory distress Eyes: PERLA Vitals: BP (!) 135/94 (BP Location: Right Arm, Patient Position: Sitting, Cuff Size: Large)    Pulse 78    Temp (!) 97.2 F (36.2 C) (Temporal)    Resp 16    Ht _0  (1.778 m)    Wt 228 lb (103.4 kg)    SpO2 99%    BMI 32.71 kg/m   BMI: Estimated body mass index is 32.71 kg/m as calculated from the following:   Height as of this encounter: _1  (1.778 m).   Weight as of this encounter: 228 lb (103.4 kg). Ideal: Ideal body weight: 73 kg (160 lb 15 oz) Adjusted ideal body weight: 85.2 kg (187 lb 12.2 oz)  Assessment   Status Diagnosis  Controlled Controlled Controlled 1. Chronic pain syndrome   2. Chronic low back pain (1ry area of Pain) (Bilateral) (L>R)   3. Chronic lower extremity pain (2ry area of Pain) (Left)   4. Chronic upper extremity pain (3ry area of Pain) (Bilateral) (L>R)   5. 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)   6. Squamous cell carcinoma of left lung ([place])   7. Pharmacologic therapy   8. Chronic use of opiate for therapeutic purpose   9. Encounter for medication management   10. Encounter for chronic pain management      Updated Problems: No problems updated.  Plan of Care  Problem-specific:  No problem-specific Assessment & Plan notes found for this encounter.  Mr. DARNELL JESCHKE has a current medication list which includes the following long-term medication(s): albuterol, albuterol, amlodipine, atorvastatin, esomeprazole, hydralazine, metformin, metoprolol succinate, metoprolol tartrate, [START ON 05/19/2021] morphine, [START ON 06/18/2021] morphine, [START ON 07/18/2021] morphine, nortriptyline, potassium chloride er, and sumatriptan.  Pharmacotherapy (Medications Ordered): Meds ordered this encounter  Medications   morphine (MSIR) 15 MG tablet    Sig: Take 1 tablet (15 mg total) by mouth every 6 (six) hours as needed for moderate pain or severe pain. Must last 30 days.    Dispense:  120 tablet    Refill:  0    DO NOT: delete (not duplicate); no partial-fill (will deny script to complete), no refill request (F/U required). DISPENSE: 1 day early if closed on fill date. WARN: No CNS-depressants within 8 hrs of med.   morphine (MSIR) 15 MG tablet    Sig:  Take 1 tablet (15 mg total) by mouth every 6 (six) hours as needed for moderate pain or severe pain. Must last 30 days.    Dispense:  120 tablet    Refill:  0    DO NOT: delete (not duplicate); no partial-fill (will deny script to complete), no refill request (F/U required). DISPENSE: 1 day early if closed on fill date. WARN: No CNS-depressants within 8 hrs of med.   morphine (MSIR) 15 MG tablet    Sig: Take 1 tablet (15 mg total) by mouth every 6 (six) hours as needed for moderate pain or severe pain. Must last 30 days.    Dispense:  120 tablet    Refill:  0    DO NOT: delete (not duplicate); no partial-fill (will deny script to complete), no refill request (F/U required). DISPENSE: 1 day early if closed on fill date. WARN: No CNS-depressants within 8 hrs of med.   Orders:  Orders Placed This Encounter  Procedures  ToxASSURE Select 13 (MW), Urine    Volume: 30 ml(s). Minimum 3 ml of urine is needed. Document temperature of fresh sample. Indications: Long term (current) use of opiate analgesic (S28.315)    Order Specific Question:   Release to patient    Answer:   Immediate   Follow-up plan:   Return in about 3 months (around 08/17/2021) for Eval-day (M,W), (F2F), (MM).     Interventional Therapies  Risk   Complexity Considerations:   Estimated body mass index is 32.71 kg/m as calculated from the following:   Height as of this encounter: _0  (1.778 m).   Weight as of this encounter: 228 lb (103.4 kg). WNL   Planned   Pending:   None at this time. Claims to have anaphylactic allergy to Local Anesthetics. He was sent for testing and shown/proven not to be allergic to Lidocaine.    Under consideration:   None   Completed:   None.   Therapeutic   Palliative (PRN) options:   None.    Recent Visits Date Type Provider Dept  02/14/21 Office Visit [name], MD [facility]  Showing recent visits within past 90 days and meeting all other requirements Today's  Visits Date Type Provider Dept  05/14/21 Office Visit [name], MD [facility]  Showing today's visits and meeting all other requirements Future Appointments No visits were found meeting these conditions. Showing future appointments within next 90 days and meeting all other requirements  I discussed the assessment and treatment plan with the patient. The patient was provided an opportunity to ask questions and all were answered. The patient agreed with the plan and demonstrated an understanding of the instructions.  Patient advised to call back or seek an in-person evaluation if the symptoms or condition worsens.  Duration of encounter: 30 minutes.  Note by: Gaspar Cola, MD Date: 05/14/2021; Time: 9:04 AM

## 2021-05-14 ENCOUNTER — Encounter: Payer: Self-pay | Admitting: Pain Medicine

## 2021-05-14 ENCOUNTER — Ambulatory Visit: Payer: Medicare Other | Attending: Pain Medicine | Admitting: Pain Medicine

## 2021-05-14 ENCOUNTER — Other Ambulatory Visit: Payer: Self-pay

## 2021-05-14 VITALS — BP 135/94 | HR 78 | Temp 97.2°F | Resp 16 | Ht 70.0 in | Wt 228.0 lb

## 2021-05-14 DIAGNOSIS — C3492 Malignant neoplasm of unspecified part of left bronchus or lung: Secondary | ICD-10-CM | POA: Diagnosis not present

## 2021-05-14 DIAGNOSIS — M79605 Pain in left leg: Secondary | ICD-10-CM | POA: Diagnosis not present

## 2021-05-14 DIAGNOSIS — M5441 Lumbago with sciatica, right side: Secondary | ICD-10-CM | POA: Insufficient documentation

## 2021-05-14 DIAGNOSIS — M961 Postlaminectomy syndrome, not elsewhere classified: Secondary | ICD-10-CM | POA: Insufficient documentation

## 2021-05-14 DIAGNOSIS — M79601 Pain in right arm: Secondary | ICD-10-CM | POA: Insufficient documentation

## 2021-05-14 DIAGNOSIS — G8929 Other chronic pain: Secondary | ICD-10-CM | POA: Diagnosis not present

## 2021-05-14 DIAGNOSIS — M5442 Lumbago with sciatica, left side: Secondary | ICD-10-CM | POA: Diagnosis not present

## 2021-05-14 DIAGNOSIS — G894 Chronic pain syndrome: Secondary | ICD-10-CM | POA: Insufficient documentation

## 2021-05-14 DIAGNOSIS — Z79891 Long term (current) use of opiate analgesic: Secondary | ICD-10-CM | POA: Insufficient documentation

## 2021-05-14 DIAGNOSIS — Z79899 Other long term (current) drug therapy: Secondary | ICD-10-CM | POA: Diagnosis not present

## 2021-05-14 DIAGNOSIS — M79602 Pain in left arm: Secondary | ICD-10-CM | POA: Diagnosis not present

## 2021-05-14 MED ORDER — MORPHINE SULFATE 15 MG PO TABS: 15.0000 mg | ORAL_TABLET | Freq: Four times a day (QID) | ORAL | 0 refills | Status: DC | PRN

## 2021-05-14 NOTE — Patient Instructions (Signed)
____________________________________________________________________________________________  Medication Rules  Purpose: To inform patients, and their family members, of our rules and regulations.  Applies to: All patients receiving prescriptions (written or electronic).  Pharmacy of record: Pharmacy where electronic prescriptions will be sent. If written prescriptions are taken to a different pharmacy, please inform the nursing staff. The pharmacy listed in the electronic medical record should be the one where you would like electronic prescriptions to be sent.  Electronic prescriptions: In compliance with the [place] (STOP) Act of 2017 (Session Lanny Cramp [phone]), effective March 04, 2018, all controlled substances must be electronically prescribed. Calling prescriptions to the pharmacy will cease to exist.  Prescription refills: Only during scheduled appointments. Applies to all prescriptions.  NOTE: The following applies primarily to controlled substances (Opioid* Pain Medications).   Type of encounter (visit): For patients receiving controlled substances, face-to-face visits are required. (Not an option or up to the patient.)  Patient's responsibilities: Pain Pills: Bring all pain pills to every appointment (except for procedure appointments). Pill Bottles: Bring pills in original pharmacy bottle. Always bring the newest bottle. Bring bottle, even if empty. Medication refills: You are responsible for knowing and keeping track of what medications you take and those you need refilled. The day before your appointment: write a list of all prescriptions that need to be refilled. The day of the appointment: give the list to the admitting nurse. Prescriptions will be written only during appointments. No prescriptions will be written on procedure days. If you forget a medication: it will not be "Called in", "Faxed", or "electronically sent". You will  need to get another appointment to get these prescribed. No early refills. Do not call asking to have your prescription filled early. Prescription Accuracy: You are responsible for carefully inspecting your prescriptions before leaving our office. Have the discharge nurse carefully go over each prescription with you, before taking them home. Make sure that your name is accurately spelled, that your address is correct. Check the name and dose of your medication to make sure it is accurate. Check the number of pills, and the written instructions to make sure they are clear and accurate. Make sure that you are given enough medication to last until your next medication refill appointment. Taking Medication: Take medication as prescribed. When it comes to controlled substances, taking less pills or less frequently than prescribed is permitted and encouraged. Never take more pills than instructed. Never take medication more frequently than prescribed.  Inform other Doctors: Always inform, all of your healthcare providers, of all the medications you take. Pain Medication from other Providers: You are not allowed to accept any additional pain medication from any other Doctor or Healthcare provider. There are two exceptions to this rule. (see below) In the event that you require additional pain medication, you are responsible for notifying us, as stated below. Cough Medicine: Often these contain an opioid, such as codeine or hydrocodone. Never accept or take cough medicine containing these opioids if you are already taking an opioid* medication. The combination may cause respiratory failure and death. Medication Agreement: You are responsible for carefully reading and following our Medication Agreement. This must be signed before receiving any prescriptions from our practice. Safely store a copy of your signed Agreement. Violations to the Agreement will result in no further prescriptions. (Additional copies of our  Medication Agreement are available upon request.) Laws, Rules, & Regulations: All patients are expected to follow all Federal and [employer], [employer], Rules, [employer] [facility]. Ignorance of  the Laws does not constitute a valid excuse.  Illegal drugs and Controlled Substances: The use of illegal substances (including, but not limited to marijuana and its derivatives) and/or the illegal use of any controlled substances is strictly prohibited. Violation of this rule may result in the immediate and permanent discontinuation of any and all prescriptions being written by our practice. The use of any illegal substances is prohibited. Adopted CDC guidelines & recommendations: Target dosing levels will be at or below 60 MME/day. Use of benzodiazepines** is not recommended.  Exceptions: There are only two exceptions to the rule of not receiving pain medications from other Healthcare Providers. Exception #1 (Emergencies): In the event of an emergency (i.e.: accident requiring emergency care), you are allowed to receive additional pain medication. However, you are responsible for: As soon as you are able, call our office [phone], at any time of the day or night, and leave a message stating your name, the date and nature of the emergency, and the name and dose of the medication prescribed. In the event that your call is answered by a member of our staff, make sure to document and save the date, time, and the name of the person that took your information.  Exception #2 (Planned Surgery): In the event that you are scheduled by another doctor or dentist to have any type of surgery or procedure, you are allowed (for a period no longer than 30 days), to receive additional pain medication, for the acute post-op pain. However, in this case, you are responsible for picking up a copy of our "Post-op Pain Management for Surgeons" handout, and giving it to your surgeon or dentist. This document is available at our office, and  does not require an appointment to obtain it. Simply go to our office during business hours (Monday-Thursday from 8:00 AM to 4:00 PM) (Friday 8:00 AM to 12:00 Noon) or if you have a scheduled appointment with [place], prior to your surgery, and ask for it by name. In addition, you are responsible for: calling our office [phone], at any time of the day or night, and leaving a message stating your name, name of your surgeon, type of surgery, and date of procedure or surgery. Failure to comply with your responsibilities may result in termination of therapy involving the controlled substances. Medication Agreement Violation. Following the above rules, including your responsibilities will help you in avoiding a Medication Agreement Violation (Breaking your Pain Medication Contract).  *Opioid medications include: morphine, codeine, oxycodone, oxymorphone, hydrocodone, hydromorphone, meperidine, tramadol, tapentadol, buprenorphine, fentanyl, methadone. **Benzodiazepine medications include: diazepam (Valium), alprazolam (Xanax), clonazepam (Klonopine), lorazepam (Ativan), clorazepate (Tranxene), chlordiazepoxide (Librium), estazolam (Prosom), oxazepam (Serax), temazepam (Restoril), triazolam (Halcion) (Last updated: 11/29/2020) ____________________________________________________________________________________________  ____________________________________________________________________________________________  Medication Recommendations and Reminders  Applies to: All patients receiving prescriptions (written and/or electronic).  Medication Rules & Regulations: These rules and regulations exist for your safety and that of others. They are not flexible and neither are we. Dismissing or ignoring them will be considered "non-compliance" with medication therapy, resulting in complete and irreversible termination of such therapy. (See document titled "Medication Rules" for more details.) In all conscience,  because of safety reasons, we cannot continue providing a therapy where the patient does not follow instructions.  Pharmacy of record:  Definition: This is the pharmacy where your electronic prescriptions will be sent.  We do not endorse any particular pharmacy, however, we have experienced problems with [facility] not securing enough medication supply for the community. We do not restrict you  in your choice of pharmacy. However, once we write for your prescriptions, we will NOT be re-sending more prescriptions to fix restricted supply problems created by your pharmacy, or your insurance.  The pharmacy listed in the electronic medical record should be the one where you want electronic prescriptions to be sent. If you choose to change pharmacy, simply notify our nursing staff.  Recommendations: Keep all of your pain medications in a safe place, under lock and key, even if you live alone. We will NOT replace lost, stolen, or damaged medication. After you fill your prescription, take 1 week's worth of pills and put them away in a safe place. You should keep a separate, properly labeled bottle for this purpose. The remainder should be kept in the original bottle. Use this as your primary supply, until it runs out. Once it's gone, then you know that you have 1 week's worth of medicine, and it is time to come in for a prescription refill. If you do this correctly, it is unlikely that you will ever run out of medicine. To make sure that the above recommendation works, it is very important that you make sure your medication refill appointments are scheduled at least 1 week before you run out of medicine. To do this in an effective manner, make sure that you do not leave the office without scheduling your next medication management appointment. Always ask the nursing staff to show you in your prescription , when your medication will be running out. Then arrange for the receptionist to get you a return appointment,  at least 7 days before you run out of medicine. Do not wait until you have 1 or 2 pills left, to come in. This is very poor planning and does not take into consideration that we may need to cancel appointments due to bad weather, sickness, or emergencies affecting our staff. DO NOT ACCEPT A "Partial Fill": If for any reason your pharmacy does not have enough pills/tablets to completely fill or refill your prescription, do not allow for a "partial fill". The law allows the pharmacy to complete that prescription within 72 hours, without requiring a new prescription. If they do not fill the rest of your prescription within those 72 hours, you will need a separate prescription to fill the remaining amount, which we will NOT provide. If the reason for the partial fill is your insurance, you will need to talk to the pharmacist about payment alternatives for the remaining tablets, but again, DO NOT ACCEPT A PARTIAL FILL, unless you can trust your pharmacist to obtain the remainder of the pills within 72 hours.  Prescription refills and/or changes in medication(s):  Prescription refills, and/or changes in dose or medication, will be conducted only during scheduled medication management appointments. (Applies to both, written and electronic prescriptions.) No refills on procedure days. No medication will be changed or started on procedure days. No changes, adjustments, and/or refills will be conducted on a procedure day. Doing so will interfere with the diagnostic portion of the procedure. No phone refills. No medications will be "called into the pharmacy". No Fax refills. No weekend refills. No Holliday refills. No after hours refills.  Remember:  Business hours are:  Monday to Thursday 8:00 AM to 4:00 PM Provider's Schedule: [name], MD - Appointments are:  Medication management: Monday and Wednesday 8:00 AM to 4:00 PM Procedure day: Tuesday and Thursday 7:30 AM to 4:00 PM Gillis Santa, MD -  Appointments are:  Medication management: Tuesday and Thursday 8:00  AM to 4:00 PM Procedure day: Monday and Wednesday 7:30 AM to 4:00 PM (Last update: 09/22/2019) ____________________________________________________________________________________________  ____________________________________________________________________________________________  CBD (cannabidiol) & Delta-8 (Delta-8 tetrahydrocannabinol) WARNING  Intro: Cannabidiol (CBD) and tetrahydrocannabinol (THC), are two natural compounds found in plants of the Cannabis genus. They can both be extracted from hemp or cannabis. Hemp and cannabis come from the Cannabis sativa plant. Both compounds interact with your bodys endocannabinoid system, but they have very different effects. CBD does not produce the high sensation associated with cannabis. Delta-8 tetrahydrocannabinol, also known as delta-8 THC, is a psychoactive substance found in the Cannabis sativa plant, of which marijuana and hemp are two varieties. THC is responsible for the high associated with the illicit use of marijuana.  Applicable to: All individuals currently taking or considering taking CBD (cannabidiol) and, more important, all patients taking opioid analgesic controlled substances (pain medication). (Example: oxycodone; oxymorphone; hydrocodone; hydromorphone; morphine; methadone; tramadol; tapentadol; fentanyl; buprenorphine; butorphanol; dextromethorphan; meperidine; codeine; etc.)  Legal status: CBD remains a Schedule I drug prohibited for any use. CBD is illegal with one exception. In the [place], CBD has a limited Transport planner (FDA) approval for the treatment of two specific types of epilepsy disorders. Only one CBD product has been approved by the FDA for this purpose: "Epidiolex". FDA is aware that some companies are marketing products containing cannabis and cannabis-derived compounds in ways that violate the [employer], Drug and Cosmetic Act  [facility] Act) and that may put the health and safety of consumers at risk. The FDA, a Federal agency, has not enforced the CBD status since 2018. UPDATE: (04/20/2021) The Drug Enforcement Agency ([place]) issued a letter stating that "delta" cannabinoids, including Delta-8-THCO and Delta-9-THCO, synthetically derived from hemp do not qualify as hemp and will be viewed as Schedule I drugs. (Schedule I drugs, substances, or chemicals are defined as drugs with no currently accepted medical use and a high potential for abuse. Some examples of Schedule I drugs are: heroin, lysergic acid diethylamide (LSD), marijuana (cannabis), 3,4-methylenedioxymethamphetamine (ecstasy), methaqualone, and peyote.) (https://jennings.com/)  Legality: Some manufacturers ship CBD products nationally, which is illegal. Often such products are sold online and are therefore available throughout the country. CBD is openly sold in head shops and health food stores in some states where such sales have not been explicitly legalized. Selling unapproved products with unsubstantiated therapeutic claims is not only a violation of the law, but also can put patients at risk, as these products have not been proven to be safe or effective. Federal illegality makes it difficult to conduct research on CBD.  Reference: "FDA Regulation of Cannabis and Cannabis-Derived Products, Including Cannabidiol (CBD)" - [employer].pt  Warning: CBD is not FDA approved and has not undergo the same manufacturing controls as prescription drugs.  This means that the purity and safety of available CBD may be questionable. Most of the time, despite manufacturer's claims, it is contaminated with THC (delta-9-tetrahydrocannabinol - the chemical in marijuana responsible for the "HIGH").  When this is the case, the [facility] contaminant will trigger a positive urine drug  screen (UDS) test for Marijuana (carboxy-THC). Because a positive UDS for any illicit substance is a violation of our medication agreement, your opioid analgesics (pain medicine) may be permanently discontinued. The FDA recently put out a warning about 5 things that everyone should be aware of regarding Delta-8 THC: Delta-8 THC products have not been evaluated or approved by the FDA for safe use and may be marketed in ways that put the  public health at risk. The FDA has received adverse event reports involving delta-8 THC-containing products. Delta-8 THC has psychoactive and intoxicating effects. Delta-8 THC manufacturing often involve use of potentially harmful chemicals to create the concentrations of delta-8 THC claimed in the marketplace. The final delta-8 THC product may have potentially harmful by-products (contaminants) due to the chemicals used in the process. Manufacturing of delta-8 THC products may occur in uncontrolled or unsanitary settings, which may lead to the presence of unsafe contaminants or other potentially harmful substances. Delta-8 THC products should be kept out of the reach of children and pets.  MORE ABOUT CBD  General Information: CBD was discovered in 4 and it is a derivative of the cannabis sativa genus plants (Marijuana and Hemp). It is one of the 113 identified substances found in Marijuana. It accounts for up to 40% of the plant's extract. As of 2018, preliminary clinical studies on CBD included research for the treatment of anxiety, movement disorders, and pain. CBD is available and consumed in multiple forms, including inhalation of smoke or vapor, as an aerosol spray, and by mouth. It may be supplied as an oil containing CBD, capsules, dried cannabis, or as a liquid solution. CBD is thought not to be as psychoactive as THC (delta-9-tetrahydrocannabinol - the chemical in marijuana responsible for the "HIGH"). Studies suggest that CBD may interact with different  biological target receptors in the body, including cannabinoid and other neurotransmitter receptors. As of 2018 the mechanism of action for its biological effects has not been determined.  Side-effects   Adverse reactions: Dry mouth, diarrhea, decreased appetite, fatigue, drowsiness, malaise, weakness, sleep disturbances, and others.  Drug interactions: CBC may interact with other medications such as blood-thinners. Because CBD causes drowsiness on its own, it also increases the drowsiness caused by other medications, including antihistamines (such as Benadryl), benzodiazepines (Xanax, Ativan, Valium), antipsychotics, antidepressants and opioids, as well as alcohol and supplements such as kava, melatonin and St. John's Wort. Be cautious with the following combinations:   Brivaracetam (Briviact) Brivaracetam is changed and broken down by the body. CBD might decrease how quickly the body breaks down brivaracetam. This might increase levels of brivaracetam in the body.  Caffeine Caffeine is changed and broken down by the body. CBD might decrease how quickly the body breaks down caffeine. This might increase levels of caffeine in the body.  Carbamazepine (Tegretol) Carbamazepine is changed and broken down by the body. CBD might decrease how quickly the body breaks down carbamazepine. This might increase levels of carbamazepine in the body and increase its side effects.  Citalopram (Celexa) Citalopram is changed and broken down by the body. CBD might decrease how quickly the body breaks down citalopram. This might increase levels of citalopram in the body and increase its side effects.  Clobazam (Onfi) Clobazam is changed and broken down by the liver. CBD might decrease how quickly the liver breaks down clobazam. This might increase the effects and side effects of clobazam.  Eslicarbazepine (Aptiom) Eslicarbazepine is changed and broken down by the body. CBD might decrease how quickly the body  breaks down eslicarbazepine. This might increase levels of eslicarbazepine in the body by a small amount.  Everolimus (Zostress) Everolimus is changed and broken down by the body. CBD might decrease how quickly the body breaks down everolimus. This might increase levels of everolimus in the body.  Lithium Taking higher doses of CBD might increase levels of lithium. This can increase the risk of lithium toxicity.  Medications changed by the  liver (Cytochrome P450 1A1 (CYP1A1) substrates) Some medications are changed and broken down by the liver. CBD might change how quickly the liver breaks down these medications. This could change the effects and side effects of these medications.  Medications changed by the liver (Cytochrome P450 1A2 (CYP1A2) substrates) Some medications are changed and broken down by the liver. CBD might change how quickly the liver breaks down these medications. This could change the effects and side effects of these medications.  Medications changed by the liver (Cytochrome P450 1B1 (CYP1B1) substrates) Some medications are changed and broken down by the liver. CBD might change how quickly the liver breaks down these medications. This could change the effects and side effects of these medications.  Medications changed by the liver (Cytochrome P450 2A6 (CYP2A6) substrates) Some medications are changed and broken down by the liver. CBD might change how quickly the liver breaks down these medications. This could change the effects and side effects of these medications.  Medications changed by the liver (Cytochrome P450 2B6 (CYP2B6) substrates) Some medications are changed and broken down by the liver. CBD might change how quickly the liver breaks down these medications. This could change the effects and side effects of these medications.  Medications changed by the liver (Cytochrome P450 2C19 (CYP2C19) substrates) Some medications are changed and broken down by the liver.  CBD might change how quickly the liver breaks down these medications. This could change the effects and side effects of these medications.  Medications changed by the liver (Cytochrome P450 2C8 (CYP2C8) substrates) Some medications are changed and broken down by the liver. CBD might change how quickly the liver breaks down these medications. This could change the effects and side effects of these medications.  Medications changed by the liver (Cytochrome P450 2C9 (CYP2C9) substrates) Some medications are changed and broken down by the liver. CBD might change how quickly the liver breaks down these medications. This could change the effects and side effects of these medications.  Medications changed by the liver (Cytochrome P450 2D6 (CYP2D6) substrates) Some medications are changed and broken down by the liver. CBD might change how quickly the liver breaks down these medications. This could change the effects and side effects of these medications.  Medications changed by the liver (Cytochrome P450 2E1 (CYP2E1) substrates) Some medications are changed and broken down by the liver. CBD might change how quickly the liver breaks down these medications. This could change the effects and side effects of these medications.  Medications changed by the liver (Cytochrome P450 3A4 (CYP3A4) substrates) Some medications are changed and broken down by the liver. CBD might change how quickly the liver breaks down these medications. This could change the effects and side effects of these medications.  Medications changed by the liver (Glucuronidated drugs) Some medications are changed and broken down by the liver. CBD might change how quickly the liver breaks down these medications. This could change the effects and side effects of these medications.  Medications that decrease the breakdown of other medications by the liver (Cytochrome P450 2C19 (CYP2C19) inhibitors) CBD is changed and broken down by the liver.  Some drugs decrease how quickly the liver changes and breaks down CBD. This could change the effects and side effects of CBD.  Medications that decrease the breakdown of other medications in the liver (Cytochrome P450 3A4 (CYP3A4) inhibitors) CBD is changed and broken down by the liver. Some drugs decrease how quickly the liver changes and breaks down CBD. This could change the  effects and side effects of CBD.  Medications that increase breakdown of other medications by the liver (Cytochrome P450 3A4 (CYP3A4) inducers) CBD is changed and broken down by the liver. Some drugs increase how quickly the liver changes and breaks down CBD. This could change the effects and side effects of CBD.  Medications that increase the breakdown of other medications by the liver (Cytochrome P450 2C19 (CYP2C19) inducers) CBD is changed and broken down by the liver. Some drugs increase how quickly the liver changes and breaks down CBD. This could change the effects and side effects of CBD.  Methadone (Dolophine) Methadone is broken down by the liver. CBD might decrease how quickly the liver breaks down methadone. Taking cannabidiol along with methadone might increase the effects and side effects of methadone.  Rufinamide (Banzel) Rufinamide is changed and broken down by the body. CBD might decrease how quickly the body breaks down rufinamide. This might increase levels of rufinamide in the body by a small amount.  Sedative medications (CNS depressants) CBD might cause sleepiness and slowed breathing. Some medications, called sedatives, can also cause sleepiness and slowed breathing. Taking CBD with sedative medications might cause breathing problems and/or too much sleepiness.  Sirolimus (Rapamune) Sirolimus is changed and broken down by the body. CBD might decrease how quickly the body breaks down sirolimus. This might increase levels of sirolimus in the body.  Stiripentol (Diacomit) Stiripentol is changed and  broken down by the body. CBD might decrease how quickly the body breaks down stiripentol. This might increase levels of stiripentol in the body and increase its side effects.  Tacrolimus (Prograf) Tacrolimus is changed and broken down by the body. CBD might decrease how quickly the body breaks down tacrolimus. This might increase levels of tacrolimus in the body.  Tamoxifen (Soltamox) Tamoxifen is changed and broken down by the body. CBD might affect how quickly the body breaks down tamoxifen. This might affect levels of tamoxifen in the body.  Topiramate (Topamax) Topiramate is changed and broken down by the body. CBD might decrease how quickly the body breaks down topiramate. This might increase levels of topiramate in the body by a small amount.  Valproate Valproic acid can cause liver injury. Taking cannabidiol with valproic acid might increase the chance of liver injury. CBD and/or valproic acid might need to be stopped, or the dose might need to be reduced.  Warfarin (Coumadin) CBD might increase levels of warfarin, which can increase the risk for bleeding. CBD and/or warfarin might need to be stopped, or the dose might need to be reduced.  Zonisamide Zonisamide is changed and broken down by the body. CBD might decrease how quickly the body breaks down zonisamide. This might increase levels of zonisamide in the body by a small amount. (Last update: 05/02/2021) ____________________________________________________________________________________________  ____________________________________________________________________________________________  Drug Holidays (Slow)  What is a "Drug Holiday"? Drug Holiday: is the name given to the period of time during which a patient stops taking a medication(s) for the purpose of eliminating tolerance to the drug.  Benefits Improved effectiveness of opioids. Decreased opioid dose needed to achieve benefits. Improved pain with lesser  dose.  What is tolerance? Tolerance: is the progressive decreased in effectiveness of a drug due to its repetitive use. With repetitive use, the body gets use to the medication and as a consequence, it loses its effectiveness. This is a common problem seen with opioid pain medications. As a result, a larger dose of the drug is needed to achieve the same effect  that used to be obtained with a smaller dose.  How long should a "Drug Holiday" last? You should stay off of the pain medicine for at least 14 consecutive days. (2 weeks)  Should I stop the medicine "cold [place]"? No. You should always coordinate with your Pain Specialist so that he/she can provide you with the correct medication dose to make the transition as smoothly as possible.  How do I stop the medicine? Slowly. You will be instructed to decrease the daily amount of pills that you take by one (1) pill every seven (7) days. This is called a "slow downward taper" of your dose. For example: if you normally take four (4) pills per day, you will be asked to drop this dose to three (3) pills per day for seven (7) days, then to two (2) pills per day for seven (7) days, then to one (1) per day for seven (7) days, and at the end of those last seven (7) days, this is when the "Drug Holiday" would start.   Will I have withdrawals? By doing a "slow downward taper" like this one, it is unlikely that you will experience any significant withdrawal symptoms. Typically, what triggers withdrawals is the sudden stop of a high dose opioid therapy. Withdrawals can usually be avoided by slowly decreasing the dose over a prolonged period of time. If you do not follow these instructions and decide to stop your medication abruptly, withdrawals may be possible.  What are withdrawals? Withdrawals: refers to the wide range of symptoms that occur after stopping or dramatically reducing opiate drugs after heavy and prolonged use. Withdrawal symptoms do not occur to  patients that use low dose opioids, or those who take the medication sporadically. Contrary to benzodiazepine (example: Valium, Xanax, etc.) or alcohol withdrawals (Delirium Tremens), opioid withdrawals are not lethal. Withdrawals are the physical manifestation of the body getting rid of the excess receptors.  Expected Symptoms Early symptoms of withdrawal may include: Agitation Anxiety Muscle aches Increased tearing Insomnia Runny nose Sweating Yawning  Late symptoms of withdrawal may include: Abdominal cramping Diarrhea Dilated pupils Goose bumps Nausea Vomiting  Will I experience withdrawals? Due to the slow nature of the taper, it is very unlikely that you will experience any.  What is a slow taper? Taper: refers to the gradual decrease in dose.  (Last update: 09/22/2019) ____________________________________________________________________________________________

## 2021-05-14 NOTE — Progress Notes (Signed)
Nursing Pain Medication Assessment:  ?Safety precautions to be maintained throughout the outpatient stay will include: orient to surroundings, keep bed in low position, maintain call bell within reach at all times, provide assistance with transfer out of bed and ambulation.  ?Medication Inspection Compliance: Pill count conducted under aseptic conditions, in front of the patient. Neither the pills nor the bottle was removed from the patient's sight at any time. Once count was completed pills were immediately returned to the patient in their original bottle. ? ?Medication: Morphine IR ?Pill/Patch Count:  16 of 120 pills remain ?Pill/Patch Appearance: Markings consistent with prescribed medication ?Bottle Appearance: Standard pharmacy container. Clearly labeled. ?Filled Date: 02 / 15 / 2023 ?Last Medication intake:  Today ?

## 2021-05-20 LAB — TOXASSURE SELECT 13 (MW), URINE

## 2021-05-22 ENCOUNTER — Telehealth: Payer: Self-pay

## 2021-05-22 NOTE — Progress Notes (Signed)
? ? ?Chronic Care Management ?Pharmacy Assistant  ? ?Name: Kristopher [name]  MRN: [id number] DOB: June 15, 1960 ? ?Kristopher [name] is an 61 y.o. year old male who presents for his follow-up CCM visit with the clinical pharmacist. ? ?Reason for Encounter: Disease State ?  ?Conditions to be addressed/monitored: ?COPD ? ? ?Recent office visits:  ?None ID ? ?Recent consult visits:  ?05/14/21 [name], MD-Pain Medicine (Back pain) ToxASSURE Select 13 (MW), Urine ordered, and Med changes: [START ON 05/19/2021] morphine, [START ON 06/18/2021] morphine, [START ON 07/18/2021] morphine, nortriptyline, potassium chloride er, and sumatriptan. ? ?Hospital visits:  ?Medication Reconciliation was completed by comparing discharge summary, patient?s EMR and Pharmacy list, and upon discussion with patient. ? ?Admitted to the hospital on 04/06/21 due to colonoscopy. Discharge date was 04/06/21. Discharged from [facility].    ? ?Medications that remain the same after Hospital Discharge:??  ?-All other medications will remain the same.   ? ?Medications: ?Outpatient Encounter Medications as of 05/22/2021  ?Medication Sig Note  ? Accu-Chek FastClix Lancets MISC USE TO TEST BLOOD SUGAR 2X A DAY   ? albuterol (PROVENTIL) (2.5 MG/3ML) 0.083% nebulizer solution Take 3 mLs (2.5 mg total) by nebulization every 6 (six) hours as needed for wheezing or shortness of breath.   ? albuterol (VENTOLIN HFA) 108 (90 Base) MCG/ACT inhaler Inhale into the lungs.   ? amLODipine (NORVASC) 5 MG tablet TAKE 1 TABLET(5 MG) BY MOUTH TWICE DAILY   ? atorvastatin (LIPITOR) 10 MG tablet Take 1 tablet (10 mg total) by mouth daily at 6 PM.   ? BREZTRI AEROSPHERE 160-9-4.8 MCG/ACT AERO Inhale 2 puffs into the lungs 2 (two) times daily. Maintenance per pulmonology   ? diphenhydrAMINE (BENADRYL) 25 MG tablet Take 50 mg by mouth daily.   ? empagliflozin (JARDIANCE) 25 MG TABS tablet TAKE 1 TABLET(25 MG) BY MOUTH DAILY BEFORE BREAKFAST   ? esomeprazole (NEXIUM)  40 MG capsule Take 1 capsule (40 mg total) by mouth daily.   ? glucose blood (ACCU-CHEK GUIDE) test strip USE TO TEST BLOOD SUGAR 2X A DAY   ? hydrALAZINE (APRESOLINE) 100 MG tablet TAKE 1 TABLET(100 MG) BY MOUTH TWICE DAILY   ? lidocaine (LIDODERM) 5 % Place 1 patch onto the skin daily. Remove & Discard patch within 12 hours or as directed by MD   ? metFORMIN (GLUCOPHAGE) 500 MG tablet Take 2 tablets (1,000 mg total) by mouth 2 (two) times daily with a meal.   ? metoprolol succinate (TOPROL-XL) 100 MG 24 hr tablet Take 1 tablet (100 mg total) by mouth 2 (two) times daily. Take with or immediately following a meal. To be taken with the 50mg  metoprolol tartrate   ? metoprolol tartrate (LOPRESSOR) 50 MG tablet Take 1 tablet (50 mg total) by mouth 2 (two) times daily. To be taken with the 100mg  metorprolol succinate.   ? montelukast (SINGULAIR) 10 MG tablet Take 10 mg by mouth daily.   ? morphine (MSIR) 15 MG tablet Take 1 tablet (15 mg total) by mouth every 6 (six) hours as needed for moderate pain or severe pain. Must last 30 days.   ? [START ON 06/18/2021] morphine (MSIR) 15 MG tablet Take 1 tablet (15 mg total) by mouth every 6 (six) hours as needed for moderate pain or severe pain. Must last 30 days.   ? [START ON 07/18/2021] morphine (MSIR) 15 MG tablet Take 1 tablet (15 mg total) by mouth every 6 (six) hours as needed for moderate pain or  severe pain. Must last 30 days. 05/14/2021: WARNING: Not a Duplicate. Future prescription. DO NOT DELETE during hospital medication reconciliation or at discharge. ?[facility] Chronic [facility] Management Patient   ? Multiple Vitamin (MULTIVITAMIN WITH MINERALS) TABS tablet Take 1 tablet by mouth daily.   ? NONFORMULARY OR COMPOUNDED ITEM Apply 1-2 mLs topically 4 (four) times daily as needed. 10% Ketamine/2% Cyclobenzaprine/6% Gabapentin Cream 11/20/2020: DO NOT DELETE during hospital medication reconciliation or at discharge. ?[facility] Chronic Pain Management Patient   ? nortriptyline (PAMELOR)  25 MG capsule Take 2 capsules (50 mg total) by mouth at bedtime.   ? Potassium Chloride ER 20 MEQ TBCR Take 1 tablet by mouth daily.   ? STIOLTO RESPIMAT 2.5-2.5 MCG/ACT AERS Inhale 2 puffs into the lungs 4 (four) times daily as needed. Prn per pulmonology notes   ? SUMAtriptan (IMITREX) 50 MG tablet Take 1 tablet (50 mg total) by mouth every 2 (two) hours as needed for migraine. Take 1 tab at onset of migraine. May repeat in 2 hours if headache persists or recurs.   ? ?Facility-Administered Encounter Medications as of 05/22/2021  ?Medication  ? 0.9 %  sodium chloride infusion  ? sodium chloride flush (NS) 0.9 % injection 10 mL  ? ?Current COPD regimen: albuterol 73ml every 6 hrs ?       Breztri 160-9-4.8 mcg/act inhale 2 puffs BID ?       Stiolto Respimat 2.5-2.5 mcg/act inhale 2 puffs 4x daily ? ? ?Any recent hospitalizations or ED visits since last visit with CPP? No ? ?Patient denies COPD symptoms, including states he is not having any wheezing or sob ? ?What recent interventions/DTPs have been made by any provider to improve breathing since last visit:None noted ? ?Have you had exacerbation/flare-up since last visit? No, none since last coordination call ? ?What do you do when you are short of breath?  Other, patient states that he has not had any sob lately since his surgery, but if so he will sit to rest ? ?Respiratory Devices/Equipment ?Do you have a nebulizer? No ?Do you use a Peak Flow Meter? No ?Do you use a maintenance inhaler? Yes ?How often do you forget to use your daily inhaler? Patient states he rarely forgets to use inhaler ?Do you use a rescue inhaler? No ?How often do you use your rescue inhaler?   Patient states that he has never had to use rescue inhaler ?Do you use a spacer with your inhaler? No ? ?Adherence Review: ?Does the patient have >5 day gap between last estimated fill date for maintenance inhaler medications? No ?  ?Care Gaps: ?Colonoscopy-04/06/21 ?Diabetic Foot  Exam-08/25/19 ?Ophthalmology-02/17/18 ?Dexa Scan - NA ?Annual Well Visit - NA ?Micro albumin-03/08/21 ?Hemoglobin A1c- 03/08/21 ? ?Star Rating Drugs: ?Jardiance 25 mg-last fill 05/11/21 90 ds ?Atorvastatin 10 mg-last fill 03/08/21 90 ds ?Metformin 100 mg-last fill 03/08/21 90 ds ? ?Ethelene Hal ?Clinical Pharmacist Assistant ?[phone]  ?

## 2021-05-25 DIAGNOSIS — J430 Unilateral pulmonary emphysema [MacLeod's syndrome]: Secondary | ICD-10-CM | POA: Diagnosis not present

## 2021-06-01 DIAGNOSIS — E1159 Type 2 diabetes mellitus with other circulatory complications: Secondary | ICD-10-CM | POA: Diagnosis not present

## 2021-06-01 DIAGNOSIS — C3492 Malignant neoplasm of unspecified part of left bronchus or lung: Secondary | ICD-10-CM

## 2021-06-01 DIAGNOSIS — F17210 Nicotine dependence, cigarettes, uncomplicated: Secondary | ICD-10-CM

## 2021-06-01 DIAGNOSIS — Z7984 Long term (current) use of oral hypoglycemic drugs: Secondary | ICD-10-CM

## 2021-06-01 DIAGNOSIS — J449 Chronic obstructive pulmonary disease, unspecified: Secondary | ICD-10-CM | POA: Diagnosis not present

## 2021-06-01 DIAGNOSIS — I10 Essential (primary) hypertension: Secondary | ICD-10-CM

## 2021-06-01 DIAGNOSIS — E785 Hyperlipidemia, unspecified: Secondary | ICD-10-CM

## 2021-06-25 DIAGNOSIS — J430 Unilateral pulmonary emphysema [MacLeod's syndrome]: Secondary | ICD-10-CM | POA: Diagnosis not present

## 2021-06-27 ENCOUNTER — Ambulatory Visit: Payer: Medicare Other | Admitting: Radiation Oncology

## 2021-06-29 NOTE — Progress Notes (Signed)
?[facility]  ?[phone] [phone] ? ?ID: [name] OB: 04-Aug-1960  MR#: [id number]  DZH#:[id number] ? ?Patient Care Team: ?[name], DO as PCP - General (Family Medicine) ?Gavin Pound, CMA (Inactive) as Certified Medical Assistant ?Telford Nab, RN as Equities trader ?Vanita Ingles, RN as Case Manager (General Practice) ?[name], MD as Referring Physician (Pain Medicine) ?[name], MD as Consulting Physician (Oncology) ?Noreene Filbert, MD as Consulting Physician (Radiation Oncology) ? 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 ? ?INTERVAL HISTORY: Patient returns to clinic for routine evaluation and discussion of his imaging results. He currently feels well and is asymptomatic.  He has a good appetite and denies weight loss.  He has no neurologic complaints.  He denies any recent fevers or illnesses.  He has no chest pain, shortness of breath, cough, or hemoptysis.  He denies any nausea, vomiting, constipation, or diarrhea.  He has no urinary complaints.  Patient feels at his baseline and offers no specific complaints today. ? ?REVIEW OF SYSTEMS:   ?Review of Systems  ?Constitutional: Negative.  Negative for fever, malaise/fatigue and weight loss.  ?Respiratory: Negative.  Negative for cough and shortness of breath.   ?Cardiovascular: Negative.  Negative for chest pain and leg swelling.  ?Gastrointestinal: Negative.  Negative for abdominal pain and heartburn.  ?Genitourinary: Negative.  Negative for dysuria and flank pain.  ?Musculoskeletal: Negative.  Negative for back pain.  ?Skin: Negative.  Negative for rash.  ?Neurological: Negative.  Negative for dizziness, focal weakness, weakness and headaches.  ?Psychiatric/Behavioral: Negative.  The patient is not nervous/anxious.   ? ?As per HPI. Otherwise, a complete review of systems is negative. ? ?PAST MEDICAL HISTORY: ?Past  Medical History:  ?Diagnosis Date  ? Allergy   ? Arthritis   ? left foot  ? Benign hypertensive kidney disease   ? Chronic back pain   ? Four rods in back  ? Diabetes mellitus, type 2 ([place])   ? Dyspnea   ? GERD (gastroesophageal reflux disease)   ? Hypertension   ? Malignant neoplasm of lung ([place])   ? Migraines   ? daily  ? ? ?PAST SURGICAL HISTORY: ?Past Surgical History:  ?Procedure Laterality Date  ? APPENDECTOMY    ? BACK SURGERY    ? COLONOSCOPY WITH PROPOFOL N/A 02/19/2016  ? Procedure: COLONOSCOPY WITH PROPOFOL;  Surgeon: Lucilla Lame, MD;  Location: [facility];  Service: Endoscopy;  Laterality: N/A;  ? COLONOSCOPY WITH PROPOFOL N/A 01/19/2018  ? Procedure: COLONOSCOPY WITH PROPOFOL;  Surgeon: Lucilla Lame, MD;  Location: [facility];  Service: Endoscopy;  Laterality: N/A;  Diabetic - oral meds  ? COLONOSCOPY WITH PROPOFOL N/A 03/16/2021  ? Procedure: COLONOSCOPY WITH PROPOFOL;  Surgeon: Lucilla Lame, MD;  Location: [facility];  Service: Endoscopy;  Laterality: N/A;  INADEQUATE PREP  ? COLONOSCOPY WITH PROPOFOL N/A 04/06/2021  ? Procedure: COLONOSCOPY WITH PROPOFOL;  Surgeon: Lucilla Lame, MD;  Location: [facility];  Service: Endoscopy;  Laterality: N/A;  ? DG OPERATIVE LEFT HIP ([place] [facility])    ? 10/19  ? ELECTROMAGNETIC NAVIGATION BROCHOSCOPY Left 11/18/2018  ? Procedure: ELECTROMAGNETIC NAVIGATION BRONCHOSCOPY;  Surgeon: Tyler Pita, MD;  Location: [facility];  Service: Cardiopulmonary;  Laterality: Left;  ? FLEXIBLE BRONCHOSCOPY Bilateral 01/20/2019  ? Procedure: FLEXIBLE BRONCHOSCOPY;  Surgeon: Ottie Glazier, MD;  Location: [facility];  Service: Thoracic;  Laterality: Bilateral;  ? FLEXIBLE BRONCHOSCOPY Bilateral 01/22/2019  ?  Procedure: FLEXIBLE BRONCHOSCOPY;  Surgeon: Ottie Glazier, MD;  Location: [facility];  Service: Thoracic;  Laterality: Bilateral;  ? FOOT SURGERY Left   ? Screws and plates  ? JOINT REPLACEMENT Left 12/2017  ? DR Rudene Christians Hip  ? KNEE SURGERY Left   ? X 2   ? LEG SURGERY    ? LUNG CANCER SURGERY    ? POLYPECTOMY N/A 02/19/2016  ? Procedure: POLYPECTOMY;  Surgeon: Lucilla Lame, MD;  Location: [facility];  Service: Endoscopy;  Laterality: N/A;  ? POLYPECTOMY  01/19/2018  ? Procedure: POLYPECTOMY;  Surgeon: Lucilla Lame, MD;  Location: [facility];  Service: Endoscopy;;  ? PORTACATH PLACEMENT N/A 11/11/2019  ? Procedure: INSERTION PORT-A-CATH;  Surgeon: Nestor Lewandowsky, MD;  Location: [facility];  Service: General;  Laterality: N/A;  ? THORACOTOMY Left 01/14/2019  ? Procedure: THORACOTOMY MAJOR, LEFT;  Surgeon: Nestor Lewandowsky, MD;  Location: [facility];  Service: General;  Laterality: Left;  ? TOTAL HIP ARTHROPLASTY Left 12/02/2017  ? Procedure: TOTAL HIP ARTHROPLASTY ANTERIOR APPROACH;  Surgeon: Hessie Knows, MD;  Location: [facility];  Service: Orthopedics;  Laterality: Left;  ? VIDEO BRONCHOSCOPY Left 01/14/2019  ? Procedure: VIDEO BRONCHOSCOPY WITH FLUORO, LEFT;  Surgeon: Nestor Lewandowsky, MD;  Location: [facility];  Service: General;  Laterality: Left;  ? VIDEO BRONCHOSCOPY WITH ENDOBRONCHIAL NAVIGATION N/A 10/15/2019  ? Procedure: VIDEO BRONCHOSCOPY WITH ENDOBRONCHIAL NAVIGATION;  Surgeon: Ottie Glazier, MD;  Location: [facility];  Service: Thoracic;  Laterality: N/A;  ? VIDEO BRONCHOSCOPY WITH ENDOBRONCHIAL ULTRASOUND N/A 10/15/2019  ? Procedure: VIDEO BRONCHOSCOPY WITH ENDOBRONCHIAL ULTRASOUND;  Surgeon: Ottie Glazier, MD;  Location: [facility];  Service: Thoracic;  Laterality: N/A;  ? ? ?FAMILY HISTORY: ?Family History  ?Problem Relation Age of Onset  ? Cancer Father   ? Diabetes Sister   ? Thrombosis Sister   ? ? ?ADVANCED DIRECTIVES (Y/N):  N ? ?HEALTH MAINTENANCE: ?Social History  ? ?Tobacco Use  ? Smoking status: Former  ?  Packs/day: 0.25  ?  Years: 35.00  ?  Pack years: 8.75  ?  Types: Cigarettes  ?  Quit date: 01/14/2019  ?  Years since quitting: 2.4  ? Smokeless tobacco: Never  ?Vaping Use  ? Vaping Use: Never used  ?Substance Use Topics  ? Alcohol use: No   ?  Alcohol/week: 0.0 standard drinks  ? Drug use: Yes  ?  Types: Morphine  ?  Comment: prescribed  ? ? ? Colonoscopy: ? PAP: ? Bone density: ? Lipid panel: ? ?Allergies  ?Allergen Reactions  ? Acetaminophen Swelling  ? Aspirin Anaphylaxis  ? Epinephrine Anaphylaxis  ?  Does not include albuterol  ? Novocain [Procaine] Anaphylaxis  ? Penicillins Anaphylaxis  ?  Has patient had a PCN reaction causing immediate rash, facial/tongue/throat swelling, SOB or lightheadedness with hypotension: Yes ?Has patient had a PCN reaction causing severe rash involving mucus membranes or skin necrosis: No ?Has patient had a PCN reaction that required hospitalization: Yes ?Has patient had a PCN reaction occurring within the last 10 years: No ?If all of the above answers are "NO", then may proceed with Cephalosporin use. ?  ? Strawberry Extract Anaphylaxis  ? Shellfish Allergy Hives and Nausea And Vomiting  ? ? ?Current Outpatient Medications  ?Medication Sig Dispense Refill  ? Accu-Chek FastClix Lancets MISC USE TO TEST BLOOD SUGAR 2X A DAY 100 each 12  ? albuterol (PROVENTIL) (2.5 MG/3ML) 0.083% nebulizer solution Take 3 mLs (2.5 mg total) by nebulization every 6 (six) hours as  needed for wheezing or shortness of breath. 150 mL 1  ? albuterol (VENTOLIN HFA) 108 (90 Base) MCG/ACT inhaler Inhale into the lungs.    ? amLODipine (NORVASC) 5 MG tablet TAKE 1 TABLET(5 MG) BY MOUTH TWICE DAILY 180 tablet 1  ? atorvastatin (LIPITOR) 10 MG tablet Take 1 tablet (10 mg total) by mouth daily at 6 PM. 90 tablet 1  ? BREZTRI AEROSPHERE 160-9-4.8 MCG/ACT AERO Inhale 2 puffs into the lungs 2 (two) times daily. Maintenance per pulmonology 5.9 g 2  ? diphenhydrAMINE (BENADRYL) 25 MG tablet Take 50 mg by mouth daily.    ? empagliflozin (JARDIANCE) 25 MG TABS tablet TAKE 1 TABLET(25 MG) BY MOUTH DAILY BEFORE BREAKFAST 90 tablet 1  ? esomeprazole (NEXIUM) 40 MG capsule Take 1 capsule (40 mg total) by mouth daily. 90 capsule 3  ? glucose blood (ACCU-CHEK  GUIDE) test strip USE TO TEST BLOOD SUGAR 2X A DAY 100 strip 3  ? hydrALAZINE (APRESOLINE) 100 MG tablet TAKE 1 TABLET(100 MG) BY MOUTH TWICE DAILY 180 tablet 1  ? lidocaine (LIDODERM) 5 % Place 1 patch

## 2021-07-02 ENCOUNTER — Other Ambulatory Visit: Payer: Self-pay | Admitting: Family Medicine

## 2021-07-03 ENCOUNTER — Ambulatory Visit
Admission: RE | Admit: 2021-07-03 | Discharge: 2021-07-03 | Disposition: A | Discharge status: Home / Self Care | Payer: Medicare Other | Source: Ambulatory Visit | Attending: Oncology | Admitting: Oncology

## 2021-07-03 DIAGNOSIS — I700 Atherosclerosis of aorta: Secondary | ICD-10-CM | POA: Diagnosis not present

## 2021-07-03 DIAGNOSIS — R918 Other nonspecific abnormal finding of lung field: Secondary | ICD-10-CM | POA: Diagnosis not present

## 2021-07-03 DIAGNOSIS — C3492 Malignant neoplasm of unspecified part of left bronchus or lung: Secondary | ICD-10-CM | POA: Insufficient documentation

## 2021-07-03 DIAGNOSIS — M47814 Spondylosis without myelopathy or radiculopathy, thoracic region: Secondary | ICD-10-CM | POA: Diagnosis not present

## 2021-07-03 LAB — POCT I-STAT CREATININE: Creatinine, Ser: 0.9 mg/dL (ref 0.61–1.24)

## 2021-07-03 MED ORDER — IOHEXOL 300 MG/ML  SOLN
75.0000 mL | Freq: Once | INTRAMUSCULAR | Status: AC | PRN
  Administered 2021-07-03: 75 mL via INTRAVENOUS

## 2021-07-03 NOTE — Telephone Encounter (Signed)
Requested Prescriptions  ?Pending Prescriptions Disp Refills  ?? hydrALAZINE (APRESOLINE) 100 MG tablet [Pharmacy Med Name: HYDRALAZINE 100MG  (HUNDRED MG) TABS] 180 tablet 1  ?  Sig: TAKE 1 TABLET(100 MG) BY MOUTH TWICE DAILY  ?  ? Cardiovascular:  Vasodilators Failed - 07/02/2021  8:04 AM  ?  ?  Failed - ANA Screen, Ifa, Serum in normal range and within 360 days  ?  No results found for: ANA, ANATITER, LABANTI   ?  ?  Failed - Last BP in normal range  ?  BP Readings from Last 1 Encounters:  ?05/14/21 (!) 135/94  ?   ?  ?  Passed - HCT in normal range and within 360 days  ?  Hematocrit  ?Date Value Ref Range Status  ?03/08/2021 46.2 37.5 - 51.0 % Final  ?   ?  ?  Passed - HGB in normal range and within 360 days  ?  Hemoglobin  ?Date Value Ref Range Status  ?03/08/2021 15.3 13.0 - 17.7 g/dL Final  ?   ?  ?  Passed - RBC in normal range and within 360 days  ?  RBC  ?Date Value Ref Range Status  ?03/08/2021 5.19 4.14 - 5.80 x10E6/uL Final  ?01/03/2021 4.70 4.22 - 5.81 MIL/uL Final  ?   ?  ?  Passed - WBC in normal range and within 360 days  ?  WBC  ?Date Value Ref Range Status  ?03/08/2021 5.9 3.4 - 10.8 x10E3/uL Final  ?01/03/2021 6.1 4.0 - 10.5 K/uL Final  ?   ?  ?  Passed - PLT in normal range and within 360 days  ?  Platelets  ?Date Value Ref Range Status  ?03/08/2021 201 150 - 450 x10E3/uL Final  ?   ?  ?  Passed - Valid encounter within last 12 months  ?  Recent Outpatient Visits   ?      ? 1 month ago COPD exacerbation ([place])  ? [place], DO  ? 2 months ago COPD exacerbation ([place])  ? Hendrix, [place] P, DO  ? 2 months ago Sinus congestion  ? [place], [place] T, NP  ? 3 months ago Skin lesion of back  ? [place], [name], DO  ? 3 months ago Type 2 diabetes mellitus with stage 1 chronic kidney disease, without long-term current use of insulin ([place])  ? [place], [place] P, DO  ?  ?  ?Future  Appointments   ?        ? In 1 month  [place], [name]  ? In 2 months Johnson, [name], DO [facility], [name]  ?  ? ?  ?  ?  ?? metoprolol succinate (TOPROL-XL) 100 MG 24 hr tablet [Pharmacy Med Name: METOPROLOL ER SUCCINATE 100MG  TABS] 180 tablet 1  ?  Sig: TAKE 1 TABLET BY MOUTH TWICE DAILY WITH OR IMMEDIATELY FOLLOWING A MEAL; TO BE TAKEN WITH 50 MG METOPROLOL TARTRATE  ?  ? Cardiovascular:  Beta Blockers Failed - 07/02/2021  8:04 AM  ?  ?  Failed - Last BP in normal range  ?  BP Readings from Last 1 Encounters:  ?05/14/21 (!) 135/94  ?   ?  ?  Passed - Last Heart Rate in normal range  ?  Pulse Readings from Last 1 Encounters:  ?05/14/21 78  ?   ?  ?  Passed -  Valid encounter within last 6 months  ?  Recent Outpatient Visits   ?      ? 1 month ago COPD exacerbation ([place])  ? Mackey, DO  ? 2 months ago COPD exacerbation ([place])  ? [place], [place] P, DO  ? 2 months ago Sinus congestion  ? [place], [place] T, NP  ? 3 months ago Skin lesion of back  ? [place], [name], DO  ? 3 months ago Type 2 diabetes mellitus with stage 1 chronic kidney disease, without long-term current use of insulin ([place])  ? [place], [place] P, DO  ?  ?  ?Future Appointments   ?        ? In 1 month  [place], [name]  ? In 2 months Johnson, [name], DO [facility], [name]  ?  ? ?  ?  ?  ?? metoprolol tartrate (LOPRESSOR) 50 MG tablet [Pharmacy Med Name: METOPROLOL TARTRATE 50MG  TABLETS] 180 tablet 1  ?  Sig: TAKE 1 TABLET BY MOUTH TWICE DAILY. TO BE TAKEN WITH 100 MG METOPROLOL SUCCINATE  ?  ? Cardiovascular:  Beta Blockers Failed - 07/02/2021  8:04 AM  ?  ?  Failed - Last BP in normal range  ?  BP Readings from Last 1 Encounters:  ?05/14/21 (!) 135/94  ?   ?  ?  Passed - Last Heart Rate in normal range  ?  Pulse Readings from Last 1 Encounters:  ?05/14/21 78  ?   ?  ?  Passed  - Valid encounter within last 6 months  ?  Recent Outpatient Visits   ?      ? 1 month ago COPD exacerbation ([place])  ? [place], DO  ? 2 months ago COPD exacerbation ([place])  ? [place], [place] P, DO  ? 2 months ago Sinus congestion  ? [place], [place] T, NP  ? 3 months ago Skin lesion of back  ? [place], [name], DO  ? 3 months ago Type 2 diabetes mellitus with stage 1 chronic kidney disease, without long-term current use of insulin ([place])  ? [place], [place] P, DO  ?  ?  ?Future Appointments   ?        ? In 1 month  [place], [name]  ? In 2 months Wynetta Emery, Barb Merino, DO [facility], [name]  ?  ? ?  ?  ?  ? ? ?

## 2021-07-05 ENCOUNTER — Encounter: Payer: Self-pay | Admitting: Radiation Oncology

## 2021-07-05 ENCOUNTER — Inpatient Hospital Stay (HOSPITAL_BASED_OUTPATIENT_CLINIC_OR_DEPARTMENT_OTHER): Payer: Medicare Other | Admitting: Oncology

## 2021-07-05 ENCOUNTER — Ambulatory Visit
Admission: RE | Admit: 2021-07-05 | Discharge: 2021-07-05 | Disposition: A | Discharge status: Home / Self Care | Payer: Medicare Other | Source: Ambulatory Visit | Attending: Radiation Oncology | Admitting: Radiation Oncology

## 2021-07-05 ENCOUNTER — Inpatient Hospital Stay: Payer: Medicare Other | Attending: Oncology

## 2021-07-05 VITALS — BP 143/84 | HR 78 | Temp 97.5°F | Resp 16 | Ht 70.0 in | Wt 225.4 lb

## 2021-07-05 DIAGNOSIS — C3492 Malignant neoplasm of unspecified part of left bronchus or lung: Secondary | ICD-10-CM

## 2021-07-05 DIAGNOSIS — Z95828 Presence of other vascular implants and grafts: Secondary | ICD-10-CM | POA: Diagnosis not present

## 2021-07-05 DIAGNOSIS — Z880 Allergy status to penicillin: Secondary | ICD-10-CM | POA: Insufficient documentation

## 2021-07-05 DIAGNOSIS — K219 Gastro-esophageal reflux disease without esophagitis: Secondary | ICD-10-CM | POA: Diagnosis not present

## 2021-07-05 DIAGNOSIS — Z809 Family history of malignant neoplasm, unspecified: Secondary | ICD-10-CM | POA: Insufficient documentation

## 2021-07-05 DIAGNOSIS — E1122 Type 2 diabetes mellitus with diabetic chronic kidney disease: Secondary | ICD-10-CM | POA: Diagnosis not present

## 2021-07-05 DIAGNOSIS — Z6832 Body mass index (BMI) 32.0-32.9, adult: Secondary | ICD-10-CM | POA: Diagnosis not present

## 2021-07-05 DIAGNOSIS — Z902 Acquired absence of lung [part of]: Secondary | ICD-10-CM | POA: Insufficient documentation

## 2021-07-05 DIAGNOSIS — Z833 Family history of diabetes mellitus: Secondary | ICD-10-CM | POA: Diagnosis not present

## 2021-07-05 DIAGNOSIS — N181 Chronic kidney disease, stage 1: Secondary | ICD-10-CM | POA: Insufficient documentation

## 2021-07-05 DIAGNOSIS — Z85118 Personal history of other malignant neoplasm of bronchus and lung: Secondary | ICD-10-CM | POA: Diagnosis not present

## 2021-07-05 DIAGNOSIS — C3432 Malignant neoplasm of lower lobe, left bronchus or lung: Secondary | ICD-10-CM | POA: Insufficient documentation

## 2021-07-05 DIAGNOSIS — Z832 Family history of diseases of the blood and blood-forming organs and certain disorders involving the immune mechanism: Secondary | ICD-10-CM | POA: Insufficient documentation

## 2021-07-05 DIAGNOSIS — Z9221 Personal history of antineoplastic chemotherapy: Secondary | ICD-10-CM | POA: Diagnosis not present

## 2021-07-05 DIAGNOSIS — Z9049 Acquired absence of other specified parts of digestive tract: Secondary | ICD-10-CM | POA: Insufficient documentation

## 2021-07-05 DIAGNOSIS — Z9889 Other specified postprocedural states: Secondary | ICD-10-CM | POA: Insufficient documentation

## 2021-07-05 DIAGNOSIS — Z886 Allergy status to analgesic agent status: Secondary | ICD-10-CM | POA: Diagnosis not present

## 2021-07-05 DIAGNOSIS — Z79899 Other long term (current) drug therapy: Secondary | ICD-10-CM | POA: Insufficient documentation

## 2021-07-05 DIAGNOSIS — E876 Hypokalemia: Secondary | ICD-10-CM | POA: Insufficient documentation

## 2021-07-05 DIAGNOSIS — Z923 Personal history of irradiation: Secondary | ICD-10-CM | POA: Diagnosis not present

## 2021-07-05 DIAGNOSIS — M47814 Spondylosis without myelopathy or radiculopathy, thoracic region: Secondary | ICD-10-CM | POA: Diagnosis not present

## 2021-07-05 DIAGNOSIS — Z08 Encounter for follow-up examination after completed treatment for malignant neoplasm: Secondary | ICD-10-CM | POA: Diagnosis not present

## 2021-07-05 LAB — COMPREHENSIVE METABOLIC PANEL
ALT: 58 U/L — ABNORMAL HIGH (ref 0–44)
AST: 54 U/L — ABNORMAL HIGH (ref 15–41)
Albumin: 4.5 g/dL (ref 3.5–5.0)
Alkaline Phosphatase: 136 U/L — ABNORMAL HIGH (ref 38–126)
Anion gap: 9 (ref 5–15)
BUN: 9 mg/dL (ref 6–20)
CO2: 26 mmol/L (ref 22–32)
Calcium: 9.3 mg/dL (ref 8.9–10.3)
Chloride: 103 mmol/L (ref 98–111)
Creatinine, Ser: 0.77 mg/dL (ref 0.61–1.24)
GFR, Estimated: >60 mL/min (ref >60)
Glucose, Bld: 147 mg/dL — ABNORMAL HIGH (ref 70–99)
Potassium: 3.4 mmol/L — ABNORMAL LOW (ref 3.5–5.1)
Sodium: 138 mmol/L (ref 135–145)
Total Bilirubin: 0.6 mg/dL (ref 0.3–1.2)
Total Protein: 8.1 g/dL (ref 6.5–8.1)

## 2021-07-05 LAB — CBC WITH DIFFERENTIAL/PLATELET
Abs Immature Granulocytes: 0.04 10*3/uL (ref 0.00–0.07)
Basophils Absolute: 0.1 10*3/uL (ref 0.0–0.1)
Basophils Relative: 1 %
Eosinophils Absolute: 0.1 10*3/uL (ref 0.0–0.5)
Eosinophils Relative: 2 %
HCT: 46.2 % (ref 39.0–52.0)
Hemoglobin: 15.1 g/dL (ref 13.0–17.0)
Immature Granulocytes: 1 %
Lymphocytes Relative: 26 %
Lymphs Abs: 1.7 10*3/uL (ref 0.7–4.0)
MCH: 29.6 pg (ref 26.0–34.0)
MCHC: 32.7 g/dL (ref 30.0–36.0)
MCV: 90.6 fL (ref 80.0–100.0)
Monocytes Absolute: 0.4 10*3/uL (ref 0.1–1.0)
Monocytes Relative: 6 %
Neutro Abs: 4.2 10*3/uL (ref 1.7–7.7)
Neutrophils Relative %: 64 %
Platelets: 168 10*3/uL (ref 150–400)
RBC: 5.1 MIL/uL (ref 4.22–5.81)
RDW: 13.4 % (ref 11.5–15.5)
WBC: 6.5 10*3/uL (ref 4.0–10.5)
nRBC: 0 % (ref 0.0–0.2)

## 2021-07-05 LAB — TSH: TSH: 2.126 u[IU]/mL (ref 0.350–4.500)

## 2021-07-05 MED ORDER — HEPARIN SOD (PORK) LOCK FLUSH 100 UNIT/ML IV SOLN
500.0000 [IU] | Freq: Once | INTRAVENOUS | Status: AC
  Administered 2021-07-05: 500 [IU] via INTRAVENOUS
  Filled 2021-07-05: qty 5

## 2021-07-05 NOTE — Progress Notes (Signed)
Labs obtained peripherally, unable to draw from port, Port flushed well.  ?

## 2021-07-05 NOTE — Progress Notes (Signed)
Radiation Oncology ?Follow up Note ? ?Name: Kristopher [name]   ?Date:   07/05/2021 ?MRN:  [id number] ?DOB: 15-Mar-1960  ? ? ?This 61 y.o. male presents to the clinic today for 12-month follow-up status post salvage radiation therapy for stage IIIa squamous cell carcinoma left lower lobe status post resection. ? ?REFERRING PROVIDER: [name], DO ? ?HPI: Patient is a 61 year old male now at 18 months having completed concurrent chemoradiation therapy for a subcarinal lymph node which was PET positive and biopsy confirmed squamous cell carcinoma.  He is seen today in routine follow-up he is doing well specifically [place] cough hemoptysis chest tightness or any change in his pulmonary function.[name]  He recently had a CT scan of his chest showing progressive airspace opacity in the left suprahilar region corresponding to her prior radiation fields.  Radiology is concerned this might be a little more progressive than they would expect at this time although my review I am I believe this is compatible with radiation changes i.e. fibrosis. ? ?COMPLICATIONS OF TREATMENT: none ? ?FOLLOW UP COMPLIANCE: keeps appointments  ? ?PHYSICAL EXAM:  ?BP (!) 143/84 (BP Location: Left Arm, Patient Position: Sitting)   Pulse 78   Temp (!) 97.5 ?F (36.4 ?C) (Tympanic)   Resp 16   Ht 5\' 10"  (1.778 m)   Wt 225 lb 6.4 oz (102.2 kg)   BMI 32.34 kg/m?  ?Well-developed well-nourished patient in NAD. HEENT reveals PERLA, EOMI, discs not visualized.  Oral cavity is clear. No oral mucosal lesions are identified. Neck is clear without evidence of cervical or supraclavicular adenopathy. Lungs are clear to A&P. Cardiac examination is essentially unremarkable with regular rate and rhythm without murmur rub or thrill. Abdomen is benign with no organomegaly or masses noted. Motor sensory and DTR levels are equal and symmetric in the upper and lower extremities. Cranial nerves II through XII are grossly intact. Proprioception is intact. No peripheral  adenopathy or edema is identified. No motor or sensory levels are noted. Crude visual fields are within normal range. ? ?RADIOLOGY RESULTS: CT scan and serial CT scans reviewed compatible with above-stated findings ? ?PLAN: Patient is seeing Dr. Tasia Catchings I will leave it up to her decide on follow-up although I believe a 21-month CT scan would be appropriate if not PET CT scan.  We will reevaluate him after completion of those images.  I tentatively set him up for a 24-month follow-up.  Patient knows to call with any concerns. ? ?I would like to take this opportunity to thank you for allowing me to participate in the care of your patient.. ?  ? Noreene Filbert, MD ? ?

## 2021-07-06 ENCOUNTER — Encounter: Payer: Self-pay | Admitting: Oncology

## 2021-07-06 ENCOUNTER — Telehealth: Payer: Medicare Other

## 2021-07-06 ENCOUNTER — Ambulatory Visit (INDEPENDENT_AMBULATORY_CARE_PROVIDER_SITE_OTHER): Payer: Medicare Other

## 2021-07-06 DIAGNOSIS — I10 Essential (primary) hypertension: Secondary | ICD-10-CM

## 2021-07-06 DIAGNOSIS — E1122 Type 2 diabetes mellitus with diabetic chronic kidney disease: Secondary | ICD-10-CM

## 2021-07-06 DIAGNOSIS — C3490 Malignant neoplasm of unspecified part of unspecified bronchus or lung: Secondary | ICD-10-CM

## 2021-07-06 DIAGNOSIS — J441 Chronic obstructive pulmonary disease with (acute) exacerbation: Secondary | ICD-10-CM

## 2021-07-06 DIAGNOSIS — C3492 Malignant neoplasm of unspecified part of left bronchus or lung: Secondary | ICD-10-CM

## 2021-07-06 LAB — T4: T4, Total: 7.6 ug/dL (ref 4.5–12.0)

## 2021-07-06 NOTE — Chronic Care Management (AMB) (Signed)
?Chronic Care Management  ? ?CCM RN Visit Note ? ?07/06/2021 ?Name: Kristopher [name] MRN: [id number] DOB: 21-Oct-1960 ? ?Subjective: ?Kristopher [name] is a 61 y.o. year old male who is a primary care patient of [name], DO. The care management team was consulted for assistance with disease management and care coordination needs.   ? ?Engaged with patient by telephone for follow up visit in response to provider referral for case management and/or care coordination services.  ? ?Consent to Services:  ?The patient was given information about Chronic Care Management services, agreed to services, and gave verbal consent prior to initiation of services.  Please see initial visit note for detailed documentation.  ? ?Patient agreed to services and verbal consent obtained.  ? ?Assessment: Review of patient past medical history, allergies, medications, health status, including review of consultants reports, laboratory and other test data, was performed as part of comprehensive evaluation and provision of chronic care management services.  ? ?SDOH (Social Determinants of Health) assessments and interventions performed:   ? ?CCM Care Plan ? ?Allergies  ?Allergen Reactions  ? Acetaminophen Swelling  ? Aspirin Anaphylaxis  ? Epinephrine Anaphylaxis  ?  Does not include albuterol  ? Novocain [Procaine] Anaphylaxis  ? Penicillins Anaphylaxis  ?  Has patient had a PCN reaction causing immediate rash, facial/tongue/throat swelling, SOB or lightheadedness with hypotension: Yes ?Has patient had a PCN reaction causing severe rash involving mucus membranes or skin necrosis: No ?Has patient had a PCN reaction that required hospitalization: Yes ?Has patient had a PCN reaction occurring within the last 10 years: No ?If all of the above answers are "NO", then may proceed with Cephalosporin use. ?  ? Strawberry Extract Anaphylaxis  ? Shellfish Allergy Hives and Nausea And Vomiting  ? ? ?Outpatient Encounter Medications as of 07/06/2021   ?Medication Sig Note  ? Accu-Chek FastClix Lancets MISC USE TO TEST BLOOD SUGAR 2X A DAY   ? albuterol (PROVENTIL) (2.5 MG/3ML) 0.083% nebulizer solution Take 3 mLs (2.5 mg total) by nebulization every 6 (six) hours as needed for wheezing or shortness of breath.   ? albuterol (VENTOLIN HFA) 108 (90 Base) MCG/ACT inhaler Inhale into the lungs.   ? amLODipine (NORVASC) 5 MG tablet TAKE 1 TABLET(5 MG) BY MOUTH TWICE DAILY   ? atorvastatin (LIPITOR) 10 MG tablet Take 1 tablet (10 mg total) by mouth daily at 6 PM.   ? BREZTRI AEROSPHERE 160-9-4.8 MCG/ACT AERO Inhale 2 puffs into the lungs 2 (two) times daily. Maintenance per pulmonology   ? diphenhydrAMINE (BENADRYL) 25 MG tablet Take 50 mg by mouth daily.   ? empagliflozin (JARDIANCE) 25 MG TABS tablet TAKE 1 TABLET(25 MG) BY MOUTH DAILY BEFORE BREAKFAST   ? esomeprazole (NEXIUM) 40 MG capsule Take 1 capsule (40 mg total) by mouth daily.   ? glucose blood (ACCU-CHEK GUIDE) test strip USE TO TEST BLOOD SUGAR 2X A DAY   ? hydrALAZINE (APRESOLINE) 100 MG tablet TAKE 1 TABLET(100 MG) BY MOUTH TWICE DAILY   ? lidocaine (LIDODERM) 5 % Place 1 patch onto the skin daily. Remove & Discard patch within 12 hours or as directed by MD   ? metFORMIN (GLUCOPHAGE) 500 MG tablet Take 2 tablets (1,000 mg total) by mouth 2 (two) times daily with a meal.   ? metoprolol succinate (TOPROL-XL) 100 MG 24 hr tablet TAKE 1 TABLET BY MOUTH TWICE DAILY WITH OR IMMEDIATELY FOLLOWING A MEAL; TO BE TAKEN WITH 50 MG METOPROLOL TARTRATE   ? metoprolol  tartrate (LOPRESSOR) 50 MG tablet TAKE 1 TABLET BY MOUTH TWICE DAILY. TO BE TAKEN WITH 100 MG METOPROLOL SUCCINATE   ? montelukast (SINGULAIR) 10 MG tablet Take 10 mg by mouth daily.   ? morphine (MSIR) 15 MG tablet Take 1 tablet (15 mg total) by mouth every 6 (six) hours as needed for moderate pain or severe pain. Must last 30 days.   ? morphine (MSIR) 15 MG tablet Take 1 tablet (15 mg total) by mouth every 6 (six) hours as needed for moderate pain or  severe pain. Must last 30 days.   ? [START ON 07/18/2021] morphine (MSIR) 15 MG tablet Take 1 tablet (15 mg total) by mouth every 6 (six) hours as needed for moderate pain or severe pain. Must last 30 days. 05/14/2021: WARNING: Not a Duplicate. Future prescription. DO NOT DELETE during hospital medication reconciliation or at discharge. ?[facility] Chronic Pain Management Patient   ? Multiple Vitamin (MULTIVITAMIN WITH MINERALS) TABS tablet Take 1 tablet by mouth daily.   ? NONFORMULARY OR COMPOUNDED ITEM Apply 1-2 mLs topically 4 (four) times daily as needed. 10% Ketamine/2% Cyclobenzaprine/6% Gabapentin Cream 11/20/2020: DO NOT DELETE during hospital medication reconciliation or at discharge. ?[facility] Chronic Pain Management Patient   ? nortriptyline (PAMELOR) 25 MG capsule Take 2 capsules (50 mg total) by mouth at bedtime.   ? Potassium Chloride ER 20 MEQ TBCR Take 1 tablet by mouth daily.   ? STIOLTO RESPIMAT 2.5-2.5 MCG/ACT AERS Inhale 2 puffs into the lungs 4 (four) times daily as needed. Prn per pulmonology notes   ? SUMAtriptan (IMITREX) 50 MG tablet Take 1 tablet (50 mg total) by mouth every 2 (two) hours as needed for migraine. Take 1 tab at onset of migraine. May repeat in 2 hours if headache persists or recurs.   ? ?Facility-Administered Encounter Medications as of 07/06/2021  ?Medication  ? 0.9 %  sodium chloride infusion  ? sodium chloride flush (NS) 0.9 % injection 10 mL  ? ? ?Patient Active Problem List  ? Diagnosis Date Noted  ? Sinus congestion 04/10/2021  ? Chronic use of opiate for therapeutic purpose 08/20/2020  ? Malignant neoplasm of lung ([place])   ? Goals of care, counseling/discussion 11/01/2019  ? Pulmonary emphysema ([place]) 08/25/2019  ? OSA (obstructive sleep apnea) 06/16/2019  ? History of colonic polyps 02/02/2019  ? Shortness of breath 02/02/2019  ? Chronic upper extremity pain (3ry area of Pain) (Bilateral) (L>R) 01/19/2019  ? Squamous cell carcinoma of left lung ([place]) 01/16/2019  ? Lung mass  01/14/2019  ? Left lower lobe pulmonary nodule 10/26/2018  ? History of allergy to ester type local anesthetic (procaine) 10/21/2018  ?  Class: History of  ? Hyperlipidemia associated with type 2 diabetes mellitus ([place]) 07/20/2018  ? Type 2 diabetes mellitus with stage 1 chronic kidney disease, without long-term current use of insulin ([place]) 12/30/2017  ? S/P hip replacement 12/02/2017  ? Hip arthritis 10/01/2017  ? Aortic atherosclerosis ([name]) 08/25/2017  ? Left-sided weakness 08/13/2017  ? Musculoskeletal pain, chronic 04/03/2017  ? History of tobacco abuse 06/24/2016  ? GERD (gastroesophageal reflux disease) 06/24/2016  ? Tobacco abuse 06/24/2016  ? Pharmacologic therapy   ? Benign neoplasm of ascending colon   ? Polyp of sigmoid colon   ? Rectal polyp   ? Benign hypertensive renal disease 01/22/2016  ? Migraine 01/22/2016  ? Chronic pain syndrome 01/11/2016  ? Chronic sacroiliac joint pain (Bilateral) (L>R) 05/31/2015  ? Chronic hip pain (Left) 05/31/2015  ? Lumbar  facet syndrome (Bilateral) (L>R) 05/31/2015  ? Chronic lower extremity pain (2ry area of Pain) (Left) 05/31/2015  ? Greater occipital neuralgia (Right) 05/31/2015  ? Retrolisthesis of L5-S1 02/06/2015  ? Cervical disc herniation (C4-5 and C5-6) 02/06/2015  ? Lumbar disc herniation (L5-S1) 02/06/2015  ? Hypokalemia 02/01/2015  ? Cervical spinal stenosis (C4-5) 01/06/2015  ? Cervical foraminal stenosis (Bilateral C5-6) 01/06/2015  ? Chronic low back pain (1ry area of Pain) (Bilateral) (L>R) 01/05/2015  ? Lumbar spondylosis 01/05/2015  ? Chronic lumbar radicular pain (S1 dermatomal) (Left) 01/05/2015  ? 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) 01/05/2015  ? Chronic neck pain (posterior midline) (Bilateral) (L>R) 01/05/2015  ? Cervical spondylosis 01/05/2015  ? Chronic cervical radicular pain (Bilateral) (C5/C6 dermatome) (L>R) 01/05/2015  ? Long term current use of opiate analgesic 01/05/2015  ? Long term prescription opiate use  01/05/2015  ? Opiate use (60 MME/Day) 01/05/2015  ? Uncomplicated opioid dependence ([place]) 01/05/2015  ? ? ?Conditions to be addressed/monitored:HTN, COPD, DMII, and Lung Cancer ? ?Care Plan : RNCM: General Plan of

## 2021-07-06 NOTE — Patient Instructions (Signed)
Visit Information ? ?Thank you for taking time to visit with me today. Please don't hesitate to contact me if I can be of assistance to you before our next scheduled telephone appointment. ? ?Following are the goals we discussed today:  ?RNCM Clinical Goal(s):  ?Patient will verbalize understanding of plan for management of HTN, DMII, and lung cancer  as evidenced by keeping appointments, following the plan of care, working with the CCM team to effectively manage health and well being  ?demonstrate understanding of rationale for each prescribed medication as evidenced by compliance with medications and calling for refills before running out of medications     ?attend all scheduled medical appointments: saw pcp next appointment on 09-07-2021 at 10 am, as evidenced by keeping appointments and calling the office if appointment needs to be rescheduled         ?demonstrate improved and ongoing adherence to prescribed treatment plan for HTN, DMII, and lung cancer  as evidenced by effective management of chronic conditions by working with the CCM team to optimize health and well being  ?demonstrate a decrease in HTN, DMII, and Lung cancer  exacerbations  as evidenced by effective management of chronic conditions  ?demonstrate ongoing self health care management ability for effective management of chronic conditions  as evidenced by working with the CCM team through collaboration with Consulting civil engineer, provider, and care team.  ?  ?Interventions: ?1:1 collaboration with primary care provider regarding development and update of comprehensive plan of care as evidenced by provider attestation and co-signature ?Inter-disciplinary care team collaboration (see longitudinal plan of care) ?Evaluation of current treatment plan related to  self management and patient's adherence to plan as established by provider ?  ?  ?SDOH Barriers (Status: Goal on Track (progressing): YES.) Long Term Goal  ?Patient interviewed and SDOH assessment  performed ?       ?Patient interviewed and appropriate assessments performed ?Provided patient with information about resources available and care guides to assist with new needs or changes in SDOH ?Discussed plans with patient for ongoing care management follow up and provided patient with direct contact information for care management team ?Advised patient to call the office for changes in SDOH, questions or concerns ?  ?  ?  ?Diabetes:  (Status: Goal on Track (progressing): YES.) Long Term Goal  ?  ?     ?Lab Results  ?Component Value Date  ?  HGBA1C 6.8 (H) 03/08/2021  ?Slight increase from last A1C.  ?Assessed patient's understanding of A1c goal: <7%. 07-06-2021: The patient knows the goal of A1c is <7.0 ?Provided education to patient about basic DM disease process; ?Reviewed medications with patient and discussed importance of medication adherence. 05-11-2021: The patient is compliant with medications. The patient denies any issues with DM medications. The patient states that he does need a refill for his jardiance 25 mg QD. Will send an in basket message requesting a refill for his jardiance. 07-06-2021: The patient is compliant with medications. Denies any medication needs.    ?Reviewed prescribed diet with patient heart healthy/ADA diet. 03-16-2021: The patient is compliant with heart healthy/ADA diet. The patient recently was on vacation in [place] and got back last week. He states he was eating some different things while away and his blood sugars went up slightly then. 07-06-2021: Review of heart healthy/ADA diet ; ?Counseled on importance of regular laboratory monitoring as prescribed. 07-06-2021: Review of regular lab work to check levels        ?Discussed plans  with patient for ongoing care management follow up and provided patient with direct contact information for care management team;      ?Provided patient with written educational materials related to hypo and hyperglycemia and importance of correct  treatment. 03-16-2021: Denies any lows a this time. Did have a blood sugar up in 200's while on vacation but states it is stable now that he is at home. 07-06-2021: Denies any highs or lows. States that his ranges have been normal;       ?Reviewed scheduled/upcoming provider appointments including:  will see the pcp again on 09-07-2021 at 10 am ?Advised patient, providing education and rationale, to check cbg as directed  and record. 03-16-2021: The patient states that his blood sugar average has been 130 to 140. Did have one last week around 200. Denies any acute findings. 07-06-2021: States his range has been WNL. Denies any acute findings at this time. Will continue to monitor for changes.         ?call provider for findings outside established parameters;       ?Review of patient status, including review of consultants reports, relevant laboratory and other test results, and medications completed;       ?Screening for signs and symptoms of depression related to chronic disease state;        ?Assessed social determinant of health barriers;        ?  ?Hypertension: (Status: Goal on Track (progressing): YES.) ?Last practice recorded BP readings:  ?   ?BP Readings from Last 3 Encounters:  ?07/05/21 (!) 143/84  ?05/14/21 (!) 135/94  ?05/08/21 123/83  ?Most recent eGFR/CrCl:  ?     ?Lab Results  ?Component Value Date  ?  EGFR 93 03/08/2021  ?  No components found for: CRCL ?  ?Evaluation of current treatment plan related to hypertension self management and patient's adherence to plan as established by provider. 07-06-2021: The patient is doing well and denies any issues with his HTN management or heart health;   ?Provided education to patient re: stroke prevention, s/s of heart attack and stroke; ?Reviewed prescribed diet heart healthy/ADA ?Reviewed medications with patient and discussed importance of compliance. 07-06-2021: Is compliant with his medications;  ?Discussed plans with patient for ongoing care management follow up  and provided patient with direct contact information for care management team; ?Advised patient, providing education and rationale, to monitor blood pressure daily and record, calling PCP for findings outside established parameters;  ?Provided education on prescribed diet heart healthy/ADA;  ?Discussed complications of poorly controlled blood pressure such as heart disease, stroke, circulatory complications, vision complications, kidney impairment, sexual dysfunction;  ?  ?  ?Lung cancer   (Status: Goal on Track (progressing): YES.) Long Term Goal  ?Evaluation of current treatment plan related to  Lung cancer  ,  self-management and patient's adherence to plan as established by provider. 03-16-2021:  The patient is currently not taking any treatments. He goes back in February for new scans and evaluation. At that point they will determine a course of action if any. 05-11-2021: The patient goes back next week to see the oncologist. Currently is not having any treatments and states his conditions is stable. The patient states he feels great and is doing well. Will know more after his scans if he has to take additional treatments or what the plan of care will be moving forward. 07-06-2021: The patient saw oncologist yesterday and he is stable. He is going to have to have his port  removed next week because it will not flush well or return blood. The patient states that the provider states if he needs to have another one they will insert a new one. He will have a scan again in 4 months and then they will go from there. The patient states he feels good and denies any acute distress.  ?Discussed plans with patient for ongoing care management follow up and provided patient with direct contact information for care management team ?Advised patient to keep appointments, calling the office for changes in conditions, or sx and sx of adverse reactions to chemotherapy treatment for lung cancer ; ?Provided education to patient re: sx  and sx to monitor for changes, reporting increased fatigue, nausea, or pain, maintaining nutrition and utilization of support system ; ?Reviewed medications with patient and discussed compliance ; ?Reviewed sched

## 2021-07-10 ENCOUNTER — Ambulatory Visit: Payer: Medicare Other | Admitting: Surgery

## 2021-07-12 ENCOUNTER — Other Ambulatory Visit: Payer: Self-pay

## 2021-07-12 ENCOUNTER — Ambulatory Visit (INDEPENDENT_AMBULATORY_CARE_PROVIDER_SITE_OTHER): Payer: Medicare Other | Admitting: Surgery

## 2021-07-12 ENCOUNTER — Encounter: Payer: Self-pay | Admitting: Surgery

## 2021-07-12 VITALS — BP 122/81 | HR 66 | Temp 97.8°F | Ht 70.0 in | Wt 222.6 lb

## 2021-07-12 DIAGNOSIS — Z452 Encounter for adjustment and management of vascular access device: Secondary | ICD-10-CM

## 2021-07-12 DIAGNOSIS — Z95828 Presence of other vascular implants and grafts: Secondary | ICD-10-CM | POA: Insufficient documentation

## 2021-07-12 NOTE — Patient Instructions (Signed)
Please see your appointment listed below. ? ? ? ? ?Implanted Port Removal ?Implanted port removal is a procedure to remove the port and catheter (port-a-cath) that is implanted under your skin. The port is a small disc under your skin that can be punctured with a needle. It is connected to a vein in your chest or neck by a small flexible tube (catheter). The port-a-cath is used for treatment through an IV tube and for taking blood samples. ?Your health care provider will remove the port-a-cath if: ?You no longer need it for treatment. ?It is not working properly. ?The area around it gets infected. ? ?Tell a health care provider about: ?Any allergies you have. ?All medicines you are taking, including vitamins, herbs, eye drops, creams, and over-the-counter medicines. ?Any problems you or family members have had with anesthetic medicines. ?Any blood disorders you have. ?Any surgeries you have had. ?Any medical conditions you have. ?Whether you are pregnant or may be pregnant. ?What are the risks? ?Generally, this is a safe procedure. However, problems may occur, including: ?Infection. ?Bleeding. ?Allergic reactions to anesthetic medicines. ?Damage to nerves or blood vessels. ? ?What happens before the procedure? ?You will have: ?A physical exam. ?Blood tests. ?Imaging tests, including a chest X-ray. ?Follow instructions from your health care provider about eating or drinking restrictions. ?Ask your health care provider about: ?Changing or stopping your regular medicines. This is especially important if you are taking diabetes medicines or blood thinners. ?Taking medicines such as aspirin and ibuprofen. These medicines can thin your blood. Do not take these medicines before your procedure if your surgeon instructs you not to. ?Ask your health care provider how your surgical site will be marked or identified. ?You may be given antibiotic medicine to help prevent infection. ?Plan to have someone take you home after the  procedure. ?If you will be going home right after the procedure, plan to have someone stay with you for 24 hours. ?What happens during the procedure? ?To reduce your risk of infection: ?Your health care team will wash or sanitize their hands. ?Your skin will be washed with soap. ?You may be given one or more of the following: ?A medicine to help you relax (sedative). ?A medicine to numb the area (local anesthetic). ?A small cut (incision) will be made at the site of your port-a-cath. ?The port-a-cath and the catheter that has been inside your vein will gently be removed. ?The incision will be closed with stitches (sutures), adhesive strips, or skin glue. ?A bandage (dressing) will be placed over the incision. ?The procedure may vary among health care providers and hospitals. ?What happens after the procedure? ?Your blood pressure, heart rate, breathing rate, and blood oxygen level will be monitored often until the medicines you were given have worn off. ?Do not drive for 24 hours if you received a sedative. ?This information is not intended to replace advice given to you by your health care provider. Make sure you discuss any questions you have with your health care provider. ?Document Released: 01/30/2015 Document Revised: 07/27/2015 Document Reviewed: 11/23/2014 ?Elsevier Interactive Patient Education ? 2018 [place]. ? ?

## 2021-07-12 NOTE — Progress Notes (Signed)
SURGICAL OPERATIVE REPORT ? ?DATE OF PROCEDURE: 07/12/2021 ? ?ANESTHESIA: Local ? ?PRE-OPERATIVE DIAGNOSIS: Tunneled left subclavian central venous catheter with subcutaneous port, no longer needed (icd-10: W88.891) ? ?POST-OPERATIVE DIAGNOSIS:  Same ? ?PROCEDURE(S):  ?1.) Removal of indwelling left subclavian central venous catheter with subcutaneous port (cpt: 36590) ? ?INTRAOPERATIVE FINDINGS: Well-incorporated tunneled left subclavian central venous catheter with subcutaneous port without erythema or purulence ? ?INTRAVENOUS FLUIDS: 0 mL crystalloid  ? ?ESTIMATED BLOOD LOSS: Minimal (< 5 mL) ? ?SPECIMEN/EXPLANT: Port-A-Cath with Prolene sutures, port and catheter complete and intact. ? ?DRAINS: none ? ?COMPLICATIONS: None apparent ? ?CONDITION AT END OF PROCEDURE: Hemodynamically stable and awake ? ?DISPOSITION OF PATIENT: Home. ? ?INDICATIONS FOR PROCEDURE:  ?Patient is a  s/p complete utilization of a right subclavian central venous catheter with indwelling subcutaneous port, as well as confirming that it is no longer functioning, presents for evaluation and removal of his no longer needed port. Patient reports the port was never infected and always functioned properly, but since it's reportedly no longer needed, and  wishes to have it removed. All risks, benefits, and alternatives to above procedure were discussed with the patient, all of patient's questions were answered to his expressed satisfaction, and informed consent was obtained and documented. ? ?DETAILS OF PROCEDURE: ?Patient was brought to the procedure suite and appropriately identified. In supine position, operative site was prepped and draped in the usual sterile fashion, and following a brief time out, lidocaine was injected in the subcutaneous tissue overlying the port, a 2 cm incision was made through the previous scar to accommodate the port. A combination of blunt dissection, sharp dissection  were used to free the port and attached catheter  from its surrounding capsule and tissues. The port was then able to be removed from its subcutaneous pocket, the catheter was confirmed to slide freely, and the catheter was removed with immediate focal pressure applied to the venous insertion site for hemostasis. Hemostasis was then confirmed, the subcutaneous pocket was evaluated to ensure all/any previous used sutures were removed, and the wound was then closed in layers, using 3-0 Vicryl buried interrupted suture to re-approximate dermis. Additional local anesthetic was injected subcutaneously along the length of the incision, and a running subcuticular 4-0 Monocryl suture was used to re-approximate epidermis. Skin was then cleaned, dried, and sterile Dermabond was applied and allowed to dry. ? ?Ronny Bacon, M.D., FACS ?[facility] ? ?07/12/2021 ; 12:08 PM  ?

## 2021-07-19 ENCOUNTER — Ambulatory Visit (INDEPENDENT_AMBULATORY_CARE_PROVIDER_SITE_OTHER): Payer: Medicare Other | Admitting: Surgery

## 2021-07-19 ENCOUNTER — Other Ambulatory Visit: Payer: Self-pay

## 2021-07-19 ENCOUNTER — Encounter: Payer: Self-pay | Admitting: Surgery

## 2021-07-19 VITALS — BP 136/89 | HR 71 | Temp 97.7°F | Ht 70.0 in | Wt 221.6 lb

## 2021-07-19 DIAGNOSIS — Z95828 Presence of other vascular implants and grafts: Secondary | ICD-10-CM

## 2021-07-19 DIAGNOSIS — Z09 Encounter for follow-up examination after completed treatment for conditions other than malignant neoplasm: Secondary | ICD-10-CM

## 2021-07-19 DIAGNOSIS — Z452 Encounter for adjustment and management of vascular access device: Secondary | ICD-10-CM

## 2021-07-19 NOTE — Patient Instructions (Signed)
Please call the office with any questions or concerns.

## 2021-07-19 NOTE — Progress Notes (Signed)
Status post Port-A-Cath removal.  No incisional issues or complaints.  No drainage, denies pain. Left chest wall skin site, clean dry and intact.  No swelling no hematoma. Follow-up as needed.

## 2021-07-25 DIAGNOSIS — J430 Unilateral pulmonary emphysema [MacLeod's syndrome]: Secondary | ICD-10-CM | POA: Diagnosis not present

## 2021-07-31 ENCOUNTER — Telehealth: Payer: Self-pay | Admitting: Pain Medicine

## 2021-07-31 ENCOUNTER — Other Ambulatory Visit: Payer: Self-pay | Admitting: *Deleted

## 2021-07-31 DIAGNOSIS — G894 Chronic pain syndrome: Secondary | ICD-10-CM

## 2021-07-31 MED ORDER — NONFORMULARY OR COMPOUNDED ITEM: 1.0000 mL | Freq: Four times a day (QID) | 99 refills | Status: DC | PRN

## 2021-07-31 NOTE — Telephone Encounter (Signed)
Pt stated that he needed his Compound Topical Cream. Please call  Pt and Pharmacy with an update.

## 2021-07-31 NOTE — Telephone Encounter (Signed)
Rx faxed to patient pharmacy.

## 2021-07-31 NOTE — Telephone Encounter (Signed)
Refill medication request sent to [facility].

## 2021-08-01 DIAGNOSIS — Z87891 Personal history of nicotine dependence: Secondary | ICD-10-CM

## 2021-08-01 DIAGNOSIS — J449 Chronic obstructive pulmonary disease, unspecified: Secondary | ICD-10-CM | POA: Diagnosis not present

## 2021-08-01 DIAGNOSIS — E1169 Type 2 diabetes mellitus with other specified complication: Secondary | ICD-10-CM | POA: Diagnosis not present

## 2021-08-01 DIAGNOSIS — I10 Essential (primary) hypertension: Secondary | ICD-10-CM

## 2021-08-01 DIAGNOSIS — Z7984 Long term (current) use of oral hypoglycemic drugs: Secondary | ICD-10-CM | POA: Diagnosis not present

## 2021-08-01 DIAGNOSIS — C3492 Malignant neoplasm of unspecified part of left bronchus or lung: Secondary | ICD-10-CM

## 2021-08-12 NOTE — Progress Notes (Unsigned)
PROVIDER NOTE: Information contained herein reflects review and annotations entered in association with encounter. Interpretation of such information and data should be left to medically-trained personnel. Information provided to patient can be located elsewhere in the medical record under "Patient Instructions". Document created using STT-dictation technology, any transcriptional errors that may result from process are unintentional.    Patient: Kristopher [name]  Service Category: E/M  Provider: Gaspar Cola, MD  DOB: October 27, 1960  DOS: 08/13/2021  Specialty: Interventional Pain Management  MRN: [id number]  Setting: Ambulatory outpatient  PCP: Kristopher Roys, DO  Type: Established Patient    Referring Provider: [name], DO  Location: Office  Delivery: Face-to-face     HPI  [name], a 61 y.o. year old male, is here today because of his No primary diagnosis found.. [name] primary complain today is No chief complaint on file. Last encounter: My last encounter with him was on 07/31/2021. Pertinent problems: [name] has Chronic low back pain (1ry area of Pain) (Bilateral) (L>R); Lumbar spondylosis; Chronic lumbar radicular pain (S1 dermatomal) (Left); 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); Chronic neck pain (posterior midline) (Bilateral) (L>R); Cervical spondylosis; Chronic cervical radicular pain (Bilateral) (C5/C6 dermatome) (L>R); Cervical spinal stenosis (C4-5); Cervical foraminal stenosis (Bilateral C5-6); Retrolisthesis of L5-S1; Cervical disc herniation (C4-5 and C5-6); Lumbar disc herniation (L5-S1); Chronic sacroiliac joint pain (Bilateral) (L>R); Chronic hip pain (Left); Lumbar facet syndrome (Bilateral) (L>R); Chronic lower extremity pain (2ry area of Pain) (Left); Greater occipital neuralgia (Right); Chronic pain syndrome; Migraine; Musculoskeletal pain, chronic; Left-sided weakness; Hip arthritis; S/P hip replacement; Squamous cell  carcinoma of left lung (HCC); and Chronic upper extremity pain (3ry area of Pain) (Bilateral) (L>R) on their pertinent problem list. Pain Assessment: Severity of   is reported as a  /10. Location:    / . Onset:  . Quality:  . Timing:  . Modifying factor(s):  [name] Vitals:  vitals were not taken for this visit.   Reason for encounter:  *** . ***  Pharmacotherapy Assessment  Analgesic: Morphine IR 15 mg, 1 tablet p.o. every 6 hours (60 mg/day of morphine) (60 MME) + Compounded Specialty Analgesic Cream (w/ Ketamine) MME/day: 60 mg/day.   Monitoring: [place] PMP: PDMP reviewed during this encounter.       Pharmacotherapy: No side-effects or adverse reactions reported. Compliance: No problems identified. Effectiveness: Clinically acceptable.  No notes on file  UDS:  Summary  Date Value Ref Range Status  05/14/2021 Note  Final    Comment:    ==================================================================== Tox[employer] Select 13 (MW) ==================================================================== Test                             Result       Flag       Units  Drug Present and Declared for Prescription Verification   Morphine                       >00867       EXPECTED   ng/mg creat   Normorphine                    80           EXPECTED   ng/mg creat    Potential sources of large amounts of morphine in the absence of    codeine include administration of morphine or use of heroin.  Normorphine is an expected metabolite of morphine.  ==================================================================== Test                      Result    Flag   Units      Ref Range   Creatinine              81               mg/dL      >=20 ==================================================================== Declared Medications:  The flagging and interpretation on this report are based on the  following declared medications.  Unexpected results may arise from  inaccuracies in the declared  medications.   **Note: The testing scope of this panel includes these medications:   Morphine (MSIR)   **Note: The testing scope of this panel does not include the  following reported medications:   Albuterol (Ventolin HFA)  Amlodipine (Norvasc)  Atorvastatin (Lipitor)  Budesonide (Breztri Aerosphere)  Diphenhydramine (Benadryl)  Empagliflozin (Jardiance)  Esomeprazole (Nexium)  Formoterol (Breztri Aerosphere)  Glycopyrrolate (Breztri Aerosphere)  Hydralazine (Apresoline)  Metformin (Glucophage)  Metoprolol (Toprol)  Montelukast (Singulair)  Multivitamin  Nortriptyline (Pamelor)  Olodaterol (Stiolto Respimat)  Potassium  Sumatriptan (Imitrex)  Tiotropium (Stiolto Respimat)  Topical Cyclobenzaprine  Topical Gabapentin  Topical Ketamine  Topical Lidocaine (Lidoderm) ==================================================================== For clinical consultation, please call [phone]. ====================================================================      ROS  Constitutional: Denies any fever or chills Gastrointestinal: No reported hemesis, hematochezia, vomiting, or acute GI distress Musculoskeletal: Denies any acute onset joint swelling, redness, loss of ROM, or weakness Neurological: No reported episodes of acute onset apraxia, aphasia, dysarthria, agnosia, amnesia, paralysis, loss of coordination, or loss of consciousness  Medication Review  Accu-Chek FastClix Lancets, Budeson-Glycopyrrol-Formoterol, NONFORMULARY OR COMPOUNDED ITEM, Potassium Chloride ER, SUMAtriptan, Tiotropium Bromide-Olodaterol, albuterol, amLODipine, atorvastatin, diphenhydrAMINE, empagliflozin, esomeprazole, glucose blood, hydrALAZINE, lidocaine, metFORMIN, metoprolol succinate, metoprolol tartrate, montelukast, morphine, multivitamin with minerals, and nortriptyline  History Review  Allergy: [name] is allergic to acetaminophen, aspirin, epinephrine, novocain [procaine], penicillins,  strawberry extract, and shellfish allergy. Drug: Kristopher [name]  reports current drug use. Drug: Morphine. Alcohol:  reports no history of alcohol use. Tobacco:  reports that he quit smoking about 2 years ago. His smoking use included cigarettes. He has a 8.75 pack-year smoking history. He has never used smokeless tobacco. Social: Mr. Mateo  reports that he quit smoking about 2 years ago. His smoking use included cigarettes. He has a 8.75 pack-year smoking history. He has never used smokeless tobacco. He reports current drug use. Drug: Morphine. He reports that he does not drink alcohol. Medical:  has a past medical history of Allergy, Arthritis, Benign hypertensive kidney disease, Chronic back pain, Diabetes mellitus, type 2 ([place]), Dyspnea, GERD (gastroesophageal reflux disease), Hypertension, Malignant neoplasm of lung ([place]), and Migraines. Surgical: Mr. Heinz  has a past surgical history that includes Back surgery; Appendectomy; Leg Surgery; Knee surgery (Left); Foot surgery (Left); Colonoscopy with propofol (N/A, 02/19/2016); polypectomy (N/A, 02/19/2016); Total hip arthroplasty (Left, 12/02/2017); Colonoscopy with propofol (N/A, 01/19/2018); polypectomy (01/19/2018); DG OPERATIVE LEFT HIP ([place] [facility]); Joint replacement (Left, 12/2017); Video bronchoscopy (Left, 01/14/2019); Thoracotomy (Left, 01/14/2019); Flexible bronchoscopy (Bilateral, 01/20/2019); Flexible bronchoscopy (Bilateral, 01/22/2019); Electormagnetic navigation bronchoscopy (Left, 11/18/2018); Lung cancer surgery; Video bronchoscopy with endobronchial ultrasound (N/A, 10/15/2019); Video bronchoscopy with endobronchial navigation (N/A, 10/15/2019); Portacath placement (N/A, 11/11/2019); Colonoscopy with propofol (N/A, 03/16/2021); and Colonoscopy with propofol (N/A, 04/06/2021). Family: family history includes Cancer in his father; Diabetes in his sister; Thrombosis in his sister.  Laboratory Chemistry Profile   Renal Lab Results  Component Value  Date   BUN 9 07/05/2021   CREATININE 0.77 07/05/2021   BCR 7 (L) 03/08/2021   GFRAA >60 12/01/2019   GFRNONAA >60 07/05/2021    Hepatic Lab Results  Component Value Date   AST 54 (H) 07/05/2021   ALT 58 (H) 07/05/2021   ALBUMIN 4.5 07/05/2021   ALKPHOS 136 (H) 07/05/2021   LIPASE 50 02/17/2019    Electrolytes Lab Results  Component Value Date   NA 138 07/05/2021   K 3.4 (L) 07/05/2021   CL 103 07/05/2021   CALCIUM 9.3 07/05/2021   MG 2.4 01/25/2019   PHOS 3.5 01/23/2019    Bone No results found for: "VD25OH", "VD125OH2TOT", "NT7001VC9", "SW9675FF6", "25OHVITD1", "25OHVITD2", "38GYKZLD3", "TESTOFREE", "TESTOSTERONE"  Inflammation (CRP: Acute Phase) (ESR: Chronic Phase) Lab Results  Component Value Date   CRP 0.7 04/03/2015   ESRSEDRATE 11 10/21/2017   LATICACIDVEN 1.7 01/16/2019         Note: Above Lab results reviewed.  Recent Imaging Review  CT Chest W Contrast CLINICAL DATA:  Restaging of metastatic squamous cell carcinoma of the left lung with prior left lower lobectomy in November 2020. Chemotherapy and radiation therapy in 2021.  * Tracking Code: BO *  EXAM: CT CHEST WITH CONTRAST  TECHNIQUE: Multidetector CT imaging of the chest was performed during intravenous contrast administration.  RADIATION DOSE REDUCTION: This exam was performed according to the departmental dose-optimization program which includes automated exposure control, adjustment of the mA and/or kV according to patient size and/or use of iterative reconstruction technique.  CONTRAST:  57m OMNIPAQUE IOHEXOL 300 MG/ML  SOLN  COMPARISON:  12/14/2020  FINDINGS: Cardiovascular: Left Port-A-Cath tip: SVC. Mild atherosclerotic calcification of the aortic arch.  Mediastinum/Nodes: Small mediastinal lymph nodes are not pathologically enlarged.  Lungs/Pleura: Left lower lobectomy. There is a band of airspace opacity in the left suprahilar region which is substantially increased from  the prior exam of 12/14/2020, 6.0 cm in maximum AP dimension and up to about 1.5 cm transverse, previously only with patchy nodularity in this region rather than a more confluent focal airspace opacity. Air bronchograms are noted. The lesion certainly resembles radiation pneumonitis or radiation fibrosis, but given that the patient's radiation therapy ended on January 05, 2020, would be unusual to see this degree of substantial progression of radiation fibrosis between October 2022 in today, making it difficult to confidently exclude the possibility of some recurrent tumor in the left suprahilar region.  Stable 3 by 4 mm posterior right upper lobe nodule on image 38 series 4, considered benign.  Upper Abdomen: Punctate calcifications in the spleen compatible with old granulomatous disease. No adrenal mass identified.  Musculoskeletal: Mild thoracic spondylosis.  IMPRESSION: 1. Progressive airspace opacity in the left suprahilar region corresponding to the area of prior radiation therapy. Although morphologically this resembles radiation fibrosis, the substantive interval increase in local airspace opacity in this location between 12/14/2020 and would be unusual to be caused by final radiation therapy performed in November 2021. Hence the possibility of local recurrence of tumor cannot be readily excluded. Radiation fibrosis and pneumonitis can sometimes have accentuated PET-CT activity which might limit ability to differentiate tumor from therapy related findings on PET-CT. Aside from PET-CT, close CT surveillance or sampling might be considered. 2.  Aortic Atherosclerosis (ICD10-I70.0).  Electronically Signed   By: WVan ClinesM.D.   On: 07/03/2021 15:24 Note: Reviewed        Physical Exam  General appearance: Well nourished, well developed, and well hydrated. In no apparent acute distress Mental status: Alert, oriented x 3 (person, place, & time)       Respiratory:  No evidence of acute respiratory distress Eyes: PERLA Vitals: There were no vitals taken for this visit. BMI: Estimated body mass index is 31.8 kg/m as calculated from the following:   Height as of 07/19/21: _0  (1.778 m).   Weight as of 07/19/21: 221 lb 9.6 oz (100.5 kg). Ideal: Patient weight not recorded  Assessment   Diagnosis Status  No diagnosis found. Controlled Controlled Controlled   Updated Problems: No problems updated.  Plan of Care  Problem-specific:  No problem-specific Assessment & Plan notes found for this encounter.  Mr. SHI BLANKENSHIP has a current medication list which includes the following long-term medication(s): albuterol, albuterol, amlodipine, atorvastatin, esomeprazole, hydralazine, metformin, metoprolol succinate, metoprolol tartrate, morphine, morphine, morphine, nortriptyline, potassium chloride er, and sumatriptan.  Pharmacotherapy (Medications Ordered): No orders of the defined types were placed in this encounter.  Orders:  No orders of the defined types were placed in this encounter.  Follow-up plan:   No follow-ups on file.     Interventional Therapies  Risk  Complexity Considerations:   Estimated body mass index is 32.71 kg/m as calculated from the following:   Height as of this encounter: _1  (1.778 m).   Weight as of this encounter: 228 lb (103.4 kg). WNL   Planned  Pending:   None at this time. Claims to have anaphylactic allergy to Local Anesthetics. He was sent for testing and shown/proven not to be allergic to Lidocaine.    Under consideration:   None   Completed:   None.   Therapeutic  Palliative (PRN) options:   None.     Recent Visits Date Type Provider Dept  05/14/21 Office Visit [name], MD [facility]  Showing recent visits within past 90 days and meeting all other requirements Future Appointments Date Type Provider Dept  08/13/21 Appointment [name], MD [facility]  Showing future appointments within next 90 days and meeting all other requirements  I discussed the assessment and treatment plan with the patient. The patient was provided an opportunity to ask questions and all were answered. The patient agreed with the plan and demonstrated an understanding of the instructions.  Patient advised to call back or seek an in-person evaluation if the symptoms or condition worsens.  Duration of encounter: *** minutes.  Note by: Kristopher Cola, MD Date: 08/13/2021; Time: 7:50 AM

## 2021-08-13 ENCOUNTER — Encounter: Payer: Self-pay | Admitting: Pain Medicine

## 2021-08-13 ENCOUNTER — Ambulatory Visit: Payer: Medicare Other | Attending: Pain Medicine | Admitting: Pain Medicine

## 2021-08-13 VITALS — BP 117/83 | HR 72 | Temp 98.2°F | Resp 16 | Ht 70.0 in | Wt 215.0 lb

## 2021-08-13 DIAGNOSIS — G894 Chronic pain syndrome: Secondary | ICD-10-CM

## 2021-08-13 DIAGNOSIS — Z79891 Long term (current) use of opiate analgesic: Secondary | ICD-10-CM

## 2021-08-13 DIAGNOSIS — M961 Postlaminectomy syndrome, not elsewhere classified: Secondary | ICD-10-CM

## 2021-08-13 DIAGNOSIS — M5442 Lumbago with sciatica, left side: Secondary | ICD-10-CM | POA: Insufficient documentation

## 2021-08-13 DIAGNOSIS — M79605 Pain in left leg: Secondary | ICD-10-CM | POA: Insufficient documentation

## 2021-08-13 DIAGNOSIS — C3492 Malignant neoplasm of unspecified part of left bronchus or lung: Secondary | ICD-10-CM | POA: Diagnosis not present

## 2021-08-13 DIAGNOSIS — M5441 Lumbago with sciatica, right side: Secondary | ICD-10-CM | POA: Insufficient documentation

## 2021-08-13 DIAGNOSIS — Z79899 Other long term (current) drug therapy: Secondary | ICD-10-CM | POA: Diagnosis not present

## 2021-08-13 DIAGNOSIS — M79602 Pain in left arm: Secondary | ICD-10-CM | POA: Diagnosis not present

## 2021-08-13 DIAGNOSIS — M79601 Pain in right arm: Secondary | ICD-10-CM | POA: Insufficient documentation

## 2021-08-13 DIAGNOSIS — G8929 Other chronic pain: Secondary | ICD-10-CM

## 2021-08-13 MED ORDER — MORPHINE SULFATE 15 MG PO TABS: 15.0000 mg | ORAL_TABLET | Freq: Four times a day (QID) | ORAL | 0 refills | Status: DC | PRN

## 2021-08-13 NOTE — Progress Notes (Signed)
Nursing Pain Medication Assessment:  Safety precautions to be maintained throughout the outpatient stay will include: orient to surroundings, keep bed in low position, maintain call bell within reach at all times, provide assistance with transfer out of bed and ambulation.  Medication Inspection Compliance: Pill count conducted under aseptic conditions, in front of the patient. Neither the pills nor the bottle was removed from the patient's sight at any time. Once count was completed pills were immediately returned to the patient in their original bottle.  Medication: Morphine IR Pill/Patch Count:  24 of 120 pills remain Pill/Patch Appearance: Markings consistent with prescribed medication Bottle Appearance: Standard pharmacy container. Clearly labeled. Filled Date: 05 / 17 / 2023 Last Medication intake:  Today

## 2021-08-13 NOTE — Patient Instructions (Signed)
____________________________________________________________________________________________  Medication Rules  Purpose: To inform patients, and their family members, of our rules and regulations.  Applies to: All patients receiving prescriptions (written or electronic).  Pharmacy of record: Pharmacy where electronic prescriptions will be sent. If written prescriptions are taken to a different pharmacy, please inform the nursing staff. The pharmacy listed in the electronic medical record should be the one where you would like electronic prescriptions to be sent.  Electronic prescriptions: In compliance with the [place] (STOP) Act of 2017 (Session Lanny Cramp [phone]), effective March 04, 2018, all controlled substances must be electronically prescribed. Calling prescriptions to the pharmacy will cease to exist.  Prescription refills: Only during scheduled appointments. Applies to all prescriptions.  NOTE: The following applies primarily to controlled substances (Opioid* Pain Medications).   Type of encounter (visit): For patients receiving controlled substances, face-to-face visits are required. (Not an option or up to the patient.)  Patient's responsibilities: Pain Pills: Bring all pain pills to every appointment (except for procedure appointments). Pill Bottles: Bring pills in original pharmacy bottle. Always bring the newest bottle. Bring bottle, even if empty. Medication refills: You are responsible for knowing and keeping track of what medications you take and those you need refilled. The day before your appointment: write a list of all prescriptions that need to be refilled. The day of the appointment: give the list to the admitting nurse. Prescriptions will be written only during appointments. No prescriptions will be written on procedure days. If you forget a medication: it will not be "Called in", "Faxed", or "electronically sent". You will  need to get another appointment to get these prescribed. No early refills. Do not call asking to have your prescription filled early. Prescription Accuracy: You are responsible for carefully inspecting your prescriptions before leaving our office. Have the discharge nurse carefully go over each prescription with you, before taking them home. Make sure that your name is accurately spelled, that your address is correct. Check the name and dose of your medication to make sure it is accurate. Check the number of pills, and the written instructions to make sure they are clear and accurate. Make sure that you are given enough medication to last until your next medication refill appointment. Taking Medication: Take medication as prescribed. When it comes to controlled substances, taking less pills or less frequently than prescribed is permitted and encouraged. Never take more pills than instructed. Never take medication more frequently than prescribed.  Inform other Doctors: Always inform, all of your healthcare providers, of all the medications you take. Pain Medication from other Providers: You are not allowed to accept any additional pain medication from any other Doctor or Healthcare provider. There are two exceptions to this rule. (see below) In the event that you require additional pain medication, you are responsible for notifying us, as stated below. Cough Medicine: Often these contain an opioid, such as codeine or hydrocodone. Never accept or take cough medicine containing these opioids if you are already taking an opioid* medication. The combination may cause respiratory failure and death. Medication Agreement: You are responsible for carefully reading and following our Medication Agreement. This must be signed before receiving any prescriptions from our practice. Safely store a copy of your signed Agreement. Violations to the Agreement will result in no further prescriptions. (Additional copies of our  Medication Agreement are available upon request.) Laws, Rules, & Regulations: All patients are expected to follow all Federal and [employer], [employer], Rules, [employer] [facility]. Ignorance of  the Laws does not constitute a valid excuse.  Illegal drugs and Controlled Substances: The use of illegal substances (including, but not limited to marijuana and its derivatives) and/or the illegal use of any controlled substances is strictly prohibited. Violation of this rule may result in the immediate and permanent discontinuation of any and all prescriptions being written by our practice. The use of any illegal substances is prohibited. Adopted CDC guidelines & recommendations: Target dosing levels will be at or below 60 MME/day. Use of benzodiazepines** is not recommended.  Exceptions: There are only two exceptions to the rule of not receiving pain medications from other Healthcare Providers. Exception #1 (Emergencies): In the event of an emergency (i.e.: accident requiring emergency care), you are allowed to receive additional pain medication. However, you are responsible for: As soon as you are able, call our office [phone], at any time of the day or night, and leave a message stating your name, the date and nature of the emergency, and the name and dose of the medication prescribed. In the event that your call is answered by a member of our staff, make sure to document and save the date, time, and the name of the person that took your information.  Exception #2 (Planned Surgery): In the event that you are scheduled by another doctor or dentist to have any type of surgery or procedure, you are allowed (for a period no longer than 30 days), to receive additional pain medication, for the acute post-op pain. However, in this case, you are responsible for picking up a copy of our "Post-op Pain Management for Surgeons" handout, and giving it to your surgeon or dentist. This document is available at our office, and  does not require an appointment to obtain it. Simply go to our office during business hours (Monday-Thursday from 8:00 AM to 4:00 PM) (Friday 8:00 AM to 12:00 Noon) or if you have a scheduled appointment with [place], prior to your surgery, and ask for it by name. In addition, you are responsible for: calling our office [phone], at any time of the day or night, and leaving a message stating your name, name of your surgeon, type of surgery, and date of procedure or surgery. Failure to comply with your responsibilities may result in termination of therapy involving the controlled substances. Medication Agreement Violation. Following the above rules, including your responsibilities will help you in avoiding a Medication Agreement Violation ("Breaking your Pain Medication Contract").  *Opioid medications include: morphine, codeine, oxycodone, oxymorphone, hydrocodone, hydromorphone, meperidine, tramadol, tapentadol, buprenorphine, fentanyl, methadone. **Benzodiazepine medications include: diazepam (Valium), alprazolam (Xanax), clonazepam (Klonopine), lorazepam (Ativan), clorazepate (Tranxene), chlordiazepoxide (Librium), estazolam (Prosom), oxazepam (Serax), temazepam (Restoril), triazolam (Halcion) (Last updated: 11/29/2020) ____________________________________________________________________________________________  ____________________________________________________________________________________________  Medication Recommendations and Reminders  Applies to: All patients receiving prescriptions (written and/or electronic).  Medication Rules & Regulations: These rules and regulations exist for your safety and that of others. They are not flexible and neither are we. Dismissing or ignoring them will be considered "non-compliance" with medication therapy, resulting in complete and irreversible termination of such therapy. (See document titled "Medication Rules" for more details.) In all conscience,  because of safety reasons, we cannot continue providing a therapy where the patient does not follow instructions.  Pharmacy of record:  Definition: This is the pharmacy where your electronic prescriptions will be sent.  We do not endorse any particular pharmacy, however, we have experienced problems with [facility] not securing enough medication supply for the community. We do not restrict you  in your choice of pharmacy. However, once we write for your prescriptions, we will NOT be re-sending more prescriptions to fix restricted supply problems created by your pharmacy, or your insurance.  The pharmacy listed in the electronic medical record should be the one where you want electronic prescriptions to be sent. If you choose to change pharmacy, simply notify our nursing staff.  Recommendations: Keep all of your pain medications in a safe place, under lock and key, even if you live alone. We will NOT replace lost, stolen, or damaged medication. After you fill your prescription, take 1 week's worth of pills and put them away in a safe place. You should keep a separate, properly labeled bottle for this purpose. The remainder should be kept in the original bottle. Use this as your primary supply, until it runs out. Once it's gone, then you know that you have 1 week's worth of medicine, and it is time to come in for a prescription refill. If you do this correctly, it is unlikely that you will ever run out of medicine. To make sure that the above recommendation works, it is very important that you make sure your medication refill appointments are scheduled at least 1 week before you run out of medicine. To do this in an effective manner, make sure that you do not leave the office without scheduling your next medication management appointment. Always ask the nursing staff to show you in your prescription , when your medication will be running out. Then arrange for the receptionist to get you a return appointment,  at least 7 days before you run out of medicine. Do not wait until you have 1 or 2 pills left, to come in. This is very poor planning and does not take into consideration that we may need to cancel appointments due to bad weather, sickness, or emergencies affecting our staff. DO NOT ACCEPT A "Partial Fill": If for any reason your pharmacy does not have enough pills/tablets to completely fill or refill your prescription, do not allow for a "partial fill". The law allows the pharmacy to complete that prescription within 72 hours, without requiring a new prescription. If they do not fill the rest of your prescription within those 72 hours, you will need a separate prescription to fill the remaining amount, which we will NOT provide. If the reason for the partial fill is your insurance, you will need to talk to the pharmacist about payment alternatives for the remaining tablets, but again, DO NOT ACCEPT A PARTIAL FILL, unless you can trust your pharmacist to obtain the remainder of the pills within 72 hours.  Prescription refills and/or changes in medication(s):  Prescription refills, and/or changes in dose or medication, will be conducted only during scheduled medication management appointments. (Applies to both, written and electronic prescriptions.) No refills on procedure days. No medication will be changed or started on procedure days. No changes, adjustments, and/or refills will be conducted on a procedure day. Doing so will interfere with the diagnostic portion of the procedure. No phone refills. No medications will be "called into the pharmacy". No Fax refills. No weekend refills. No Holliday refills. No after hours refills.  Remember:  Business hours are:  Monday to Thursday 8:00 AM to 4:00 PM Provider's Schedule: [name], MD - Appointments are:  Medication management: Monday and Wednesday 8:00 AM to 4:00 PM Procedure day: Tuesday and Thursday 7:30 AM to 4:00 PM Gillis Santa, MD -  Appointments are:  Medication management: Tuesday and Thursday 8:00  AM to 4:00 PM Procedure day: Monday and Wednesday 7:30 AM to 4:00 PM (Last update: 09/22/2019) ____________________________________________________________________________________________  ____________________________________________________________________________________________  CBD (cannabidiol) & Delta-8 (Delta-8 tetrahydrocannabinol) WARNING  Intro: Cannabidiol (CBD) and tetrahydrocannabinol (THC), are two natural compounds found in plants of the Cannabis genus. They can both be extracted from hemp or cannabis. Hemp and cannabis come from the Cannabis sativa plant. Both compounds interact with your body's endocannabinoid system, but they have very different effects. CBD does not produce the high sensation associated with cannabis. Delta-8 tetrahydrocannabinol, also known as delta-8 THC, is a psychoactive substance found in the Cannabis sativa plant, of which marijuana and hemp are two varieties. THC is responsible for the high associated with the illicit use of marijuana.  Applicable to: All individuals currently taking or considering taking CBD (cannabidiol) and, more important, all patients taking opioid analgesic controlled substances (pain medication). (Example: oxycodone; oxymorphone; hydrocodone; hydromorphone; morphine; methadone; tramadol; tapentadol; fentanyl; buprenorphine; butorphanol; dextromethorphan; meperidine; codeine; etc.)  Legal status: CBD remains a Schedule I drug prohibited for any use. CBD is illegal with one exception. In the [place], CBD has a limited Transport planner (FDA) approval for the treatment of two specific types of epilepsy disorders. Only one CBD product has been approved by the FDA for this purpose: "Epidiolex". FDA is aware that some companies are marketing products containing cannabis and cannabis-derived compounds in ways that violate the [employer], Drug and Cosmetic Act  [facility] Act) and that may put the health and safety of consumers at risk. The FDA, a Federal agency, has not enforced the CBD status since 2018. UPDATE: (04/20/2021) The Drug Enforcement Agency ([place]) issued a letter stating that "delta" cannabinoids, including Delta-8-THCO and Delta-9-THCO, synthetically derived from hemp do not qualify as hemp and will be viewed as Schedule I drugs. (Schedule I drugs, substances, or chemicals are defined as drugs with no currently accepted medical use and a high potential for abuse. Some examples of Schedule I drugs are: heroin, lysergic acid diethylamide (LSD), marijuana (cannabis), 3,4-methylenedioxymethamphetamine (ecstasy), methaqualone, and peyote.) (https://jennings.com/)  Legality: Some manufacturers ship CBD products nationally, which is illegal. Often such products are sold online and are therefore available throughout the country. CBD is openly sold in head shops and health food stores in some states where such sales have not been explicitly legalized. Selling unapproved products with unsubstantiated therapeutic claims is not only a violation of the law, but also can put patients at risk, as these products have not been proven to be safe or effective. Federal illegality makes it difficult to conduct research on CBD.  Reference: "FDA Regulation of Cannabis and Cannabis-Derived Products, Including Cannabidiol (CBD)" - [employer].pt  Warning: CBD is not FDA approved and has not undergo the same manufacturing controls as prescription drugs.  This means that the purity and safety of available CBD may be questionable. Most of the time, despite manufacturer's claims, it is contaminated with THC (delta-9-tetrahydrocannabinol - the chemical in marijuana responsible for the "HIGH").  When this is the case, the [facility] contaminant will trigger a positive urine drug  screen (UDS) test for Marijuana (carboxy-THC). Because a positive UDS for any illicit substance is a violation of our medication agreement, your opioid analgesics (pain medicine) may be permanently discontinued. The FDA recently put out a warning about 5 things that everyone should be aware of regarding Delta-8 THC: Delta-8 THC products have not been evaluated or approved by the FDA for safe use and may be marketed in ways that put the  public health at risk. The FDA has received adverse event reports involving delta-8 THC-containing products. Delta-8 THC has psychoactive and intoxicating effects. Delta-8 THC manufacturing often involve use of potentially harmful chemicals to create the concentrations of delta-8 THC claimed in the marketplace. The final delta-8 THC product may have potentially harmful by-products (contaminants) due to the chemicals used in the process. Manufacturing of delta-8 THC products may occur in uncontrolled or unsanitary settings, which may lead to the presence of unsafe contaminants or other potentially harmful substances. Delta-8 THC products should be kept out of the reach of children and pets.  MORE ABOUT CBD  General Information: CBD was discovered in [birth year] and it is a derivative of the cannabis sativa genus plants (Marijuana and Hemp). It is one of the 113 identified substances found in Marijuana. It accounts for up to 40% of the plant's extract. As of 2018, preliminary clinical studies on CBD included research for the treatment of anxiety, movement disorders, and pain. CBD is available and consumed in multiple forms, including inhalation of smoke or vapor, as an aerosol spray, and by mouth. It may be supplied as an oil containing CBD, capsules, dried cannabis, or as a liquid solution. CBD is thought not to be as psychoactive as THC (delta-9-tetrahydrocannabinol - the chemical in marijuana responsible for the "HIGH"). Studies suggest that CBD may interact with different  biological target receptors in the body, including cannabinoid and other neurotransmitter receptors. As of 2018 the mechanism of action for its biological effects has not been determined.  Side-effects  Adverse reactions: Dry mouth, diarrhea, decreased appetite, fatigue, drowsiness, malaise, weakness, sleep disturbances, and others.  Drug interactions: CBC may interact with other medications such as blood-thinners. Because CBD causes drowsiness on its own, it also increases the drowsiness caused by other medications, including antihistamines (such as Benadryl), benzodiazepines (Xanax, Ativan, Valium), antipsychotics, antidepressants and opioids, as well as alcohol and supplements such as kava, melatonin and St. John's Wort. Be cautious with the following combinations:   Brivaracetam (Briviact) Brivaracetam is changed and broken down by the body. CBD might decrease how quickly the body breaks down brivaracetam. This might increase levels of brivaracetam in the body.  Caffeine Caffeine is changed and broken down by the body. CBD might decrease how quickly the body breaks down caffeine. This might increase levels of caffeine in the body.  Carbamazepine (Tegretol) Carbamazepine is changed and broken down by the body. CBD might decrease how quickly the body breaks down carbamazepine. This might increase levels of carbamazepine in the body and increase its side effects.  Citalopram (Celexa) Citalopram is changed and broken down by the body. CBD might decrease how quickly the body breaks down citalopram. This might increase levels of citalopram in the body and increase its side effects.  Clobazam (Onfi) Clobazam is changed and broken down by the liver. CBD might decrease how quickly the liver breaks down clobazam. This might increase the effects and side effects of clobazam.  Eslicarbazepine (Aptiom) Eslicarbazepine is changed and broken down by the body. CBD might decrease how quickly the body  breaks down eslicarbazepine. This might increase levels of eslicarbazepine in the body by a small amount.  Everolimus (Zostress) Everolimus is changed and broken down by the body. CBD might decrease how quickly the body breaks down everolimus. This might increase levels of everolimus in the body.  Lithium Taking higher doses of CBD might increase levels of lithium. This can increase the risk of lithium toxicity.  Medications changed by the liver (  Cytochrome P450 1A1 (CYP1A1) substrates) Some medications are changed and broken down by the liver. CBD might change how quickly the liver breaks down these medications. This could change the effects and side effects of these medications.  Medications changed by the liver (Cytochrome P450 1A2 (CYP1A2) substrates) Some medications are changed and broken down by the liver. CBD might change how quickly the liver breaks down these medications. This could change the effects and side effects of these medications.  Medications changed by the liver (Cytochrome P450 1B1 (CYP1B1) substrates) Some medications are changed and broken down by the liver. CBD might change how quickly the liver breaks down these medications. This could change the effects and side effects of these medications.  Medications changed by the liver (Cytochrome P450 2A6 (CYP2A6) substrates) Some medications are changed and broken down by the liver. CBD might change how quickly the liver breaks down these medications. This could change the effects and side effects of these medications.  Medications changed by the liver (Cytochrome P450 2B6 (CYP2B6) substrates) Some medications are changed and broken down by the liver. CBD might change how quickly the liver breaks down these medications. This could change the effects and side effects of these medications.  Medications changed by the liver (Cytochrome P450 2C19 (CYP2C19) substrates) Some medications are changed and broken down by the liver.  CBD might change how quickly the liver breaks down these medications. This could change the effects and side effects of these medications.  Medications changed by the liver (Cytochrome P450 2C8 (CYP2C8) substrates) Some medications are changed and broken down by the liver. CBD might change how quickly the liver breaks down these medications. This could change the effects and side effects of these medications.  Medications changed by the liver (Cytochrome P450 2C9 (CYP2C9) substrates) Some medications are changed and broken down by the liver. CBD might change how quickly the liver breaks down these medications. This could change the effects and side effects of these medications.  Medications changed by the liver (Cytochrome P450 2D6 (CYP2D6) substrates) Some medications are changed and broken down by the liver. CBD might change how quickly the liver breaks down these medications. This could change the effects and side effects of these medications.  Medications changed by the liver (Cytochrome P450 2E1 (CYP2E1) substrates) Some medications are changed and broken down by the liver. CBD might change how quickly the liver breaks down these medications. This could change the effects and side effects of these medications.  Medications changed by the liver (Cytochrome P450 3A4 (CYP3A4) substrates) Some medications are changed and broken down by the liver. CBD might change how quickly the liver breaks down these medications. This could change the effects and side effects of these medications.  Medications changed by the liver (Glucuronidated drugs) Some medications are changed and broken down by the liver. CBD might change how quickly the liver breaks down these medications. This could change the effects and side effects of these medications.  Medications that decrease the breakdown of other medications by the liver (Cytochrome P450 2C19 (CYP2C19) inhibitors) CBD is changed and broken down by the liver.  Some drugs decrease how quickly the liver changes and breaks down CBD. This could change the effects and side effects of CBD.  Medications that decrease the breakdown of other medications in the liver (Cytochrome P450 3A4 (CYP3A4) inhibitors) CBD is changed and broken down by the liver. Some drugs decrease how quickly the liver changes and breaks down CBD. This could change the effects  and side effects of CBD.  Medications that increase breakdown of other medications by the liver (Cytochrome P450 3A4 (CYP3A4) inducers) CBD is changed and broken down by the liver. Some drugs increase how quickly the liver changes and breaks down CBD. This could change the effects and side effects of CBD.  Medications that increase the breakdown of other medications by the liver (Cytochrome P450 2C19 (CYP2C19) inducers) CBD is changed and broken down by the liver. Some drugs increase how quickly the liver changes and breaks down CBD. This could change the effects and side effects of CBD.  Methadone (Dolophine) Methadone is broken down by the liver. CBD might decrease how quickly the liver breaks down methadone. Taking cannabidiol along with methadone might increase the effects and side effects of methadone.  Rufinamide (Banzel) Rufinamide is changed and broken down by the body. CBD might decrease how quickly the body breaks down rufinamide. This might increase levels of rufinamide in the body by a small amount.  Sedative medications (CNS depressants) CBD might cause sleepiness and slowed breathing. Some medications, called sedatives, can also cause sleepiness and slowed breathing. Taking CBD with sedative medications might cause breathing problems and/or too much sleepiness.  Sirolimus (Rapamune) Sirolimus is changed and broken down by the body. CBD might decrease how quickly the body breaks down sirolimus. This might increase levels of sirolimus in the body.  Stiripentol (Diacomit) Stiripentol is changed and  broken down by the body. CBD might decrease how quickly the body breaks down stiripentol. This might increase levels of stiripentol in the body and increase its side effects.  Tacrolimus (Prograf) Tacrolimus is changed and broken down by the body. CBD might decrease how quickly the body breaks down tacrolimus. This might increase levels of tacrolimus in the body.  Tamoxifen (Soltamox) Tamoxifen is changed and broken down by the body. CBD might affect how quickly the body breaks down tamoxifen. This might affect levels of tamoxifen in the body.  Topiramate (Topamax) Topiramate is changed and broken down by the body. CBD might decrease how quickly the body breaks down topiramate. This might increase levels of topiramate in the body by a small amount.  Valproate Valproic acid can cause liver injury. Taking cannabidiol with valproic acid might increase the chance of liver injury. CBD and/or valproic acid might need to be stopped, or the dose might need to be reduced.  Warfarin (Coumadin) CBD might increase levels of warfarin, which can increase the risk for bleeding. CBD and/or warfarin might need to be stopped, or the dose might need to be reduced.  Zonisamide Zonisamide is changed and broken down by the body. CBD might decrease how quickly the body breaks down zonisamide. This might increase levels of zonisamide in the body by a small amount. (Last update: 05/02/2021) ____________________________________________________________________________________________  ____________________________________________________________________________________________  Drug Holidays (Slow)  What is a "Drug Holiday"? Drug Holiday: is the name given to the period of time during which a patient stops taking a medication(s) for the purpose of eliminating tolerance to the drug.  Benefits Improved effectiveness of opioids. Decreased opioid dose needed to achieve benefits. Improved pain with lesser  dose.  What is tolerance? Tolerance: is the progressive decreased in effectiveness of a drug due to its repetitive use. With repetitive use, the body gets use to the medication and as a consequence, it loses its effectiveness. This is a common problem seen with opioid pain medications. As a result, a larger dose of the drug is needed to achieve the same effect that  used to be obtained with a smaller dose.  How long should a "Drug Holiday" last? You should stay off of the pain medicine for at least 14 consecutive days. (2 weeks)  Should I stop the medicine "cold [place]"? No. You should always coordinate with your Pain Specialist so that he/she can provide you with the correct medication dose to make the transition as smoothly as possible.  How do I stop the medicine? Slowly. You will be instructed to decrease the daily amount of pills that you take by one (1) pill every seven (7) days. This is called a "slow downward taper" of your dose. For example: if you normally take four (4) pills per day, you will be asked to drop this dose to three (3) pills per day for seven (7) days, then to two (2) pills per day for seven (7) days, then to one (1) per day for seven (7) days, and at the end of those last seven (7) days, this is when the "Drug Holiday" would start.   Will I have withdrawals? By doing a "slow downward taper" like this one, it is unlikely that you will experience any significant withdrawal symptoms. Typically, what triggers withdrawals is the sudden stop of a high dose opioid therapy. Withdrawals can usually be avoided by slowly decreasing the dose over a prolonged period of time. If you do not follow these instructions and decide to stop your medication abruptly, withdrawals may be possible.  What are withdrawals? Withdrawals: refers to the wide range of symptoms that occur after stopping or dramatically reducing opiate drugs after heavy and prolonged use. Withdrawal symptoms do not occur to  patients that use low dose opioids, or those who take the medication sporadically. Contrary to benzodiazepine (example: Valium, Xanax, etc.) or alcohol withdrawals ("Delirium Tremens"), opioid withdrawals are not lethal. Withdrawals are the physical manifestation of the body getting rid of the excess receptors.  Expected Symptoms Early symptoms of withdrawal may include: Agitation Anxiety Muscle aches Increased tearing Insomnia Runny nose Sweating Yawning  Late symptoms of withdrawal may include: Abdominal cramping Diarrhea Dilated pupils Goose bumps Nausea Vomiting  Will I experience withdrawals? Due to the slow nature of the taper, it is very unlikely that you will experience any.  What is a slow taper? Taper: refers to the gradual decrease in dose.  (Last update: 09/22/2019) ____________________________________________________________________________________________

## 2021-08-18 ENCOUNTER — Other Ambulatory Visit: Payer: Self-pay | Admitting: Family Medicine

## 2021-08-20 ENCOUNTER — Ambulatory Visit (INDEPENDENT_AMBULATORY_CARE_PROVIDER_SITE_OTHER): Payer: Medicare Other | Admitting: *Deleted

## 2021-08-20 DIAGNOSIS — Z0000 Encounter for general adult medical examination without abnormal findings: Secondary | ICD-10-CM

## 2021-08-20 NOTE — Patient Instructions (Signed)
[name] , Thank you for taking time to come for your Medicare Wellness Visit. I appreciate your ongoing commitment to your health goals. Please review the following plan we discussed and let me know if I can assist you in the future.   Screening recommendations/referrals: Colonoscopy: up to date Recommended yearly ophthalmology/optometry visit for glaucoma screening and checkup Recommended yearly dental visit for hygiene and checkup  Vaccinations: Influenza vaccine: Education provided Pneumococcal vaccine: up to date Tdap vaccine: up to date Shingles vaccine: up to date    Advanced directives: not on file  Conditions/risks identified:   Next appointment: 09-07-2021 @ 10:00 [facility] 40-64 Years, Male Preventive care refers to lifestyle choices and visits with your health care provider that can promote health and wellness. What does preventive care include? A yearly physical exam. This is also called an annual well check. Dental exams once or twice a year. Routine eye exams. Ask your health care provider how often you should have your eyes checked. Personal lifestyle choices, including: Daily care of your teeth and gums. Regular physical activity. Eating a healthy diet. Avoiding tobacco and drug use. Limiting alcohol use. Practicing safe sex. Taking low-dose aspirin every day starting at age 3. What happens during an annual well check? The services and screenings done by your health care provider during your annual well check will depend on your age, overall health, lifestyle risk factors, and family history of disease. Counseling  Your health care provider may ask you questions about your: Alcohol use. Tobacco use. Drug use. Emotional well-being. Home and relationship well-being. Sexual activity. Eating habits. Work and work Statistician. Screening  You may have the following tests or measurements: Height, weight, and BMI. Blood pressure. Lipid and  cholesterol levels. These may be checked every 5 years, or more frequently if you are over 12 years old. Skin check. Lung cancer screening. You may have this screening every year starting at age 41 if you have a 30-pack-year history of smoking and currently smoke or have quit within the past 15 years. Fecal occult blood test (FOBT) of the stool. You may have this test every year starting at age 62. Flexible sigmoidoscopy or colonoscopy. You may have a sigmoidoscopy every 5 years or a colonoscopy every 10 years starting at age 68. Prostate cancer screening. Recommendations will vary depending on your family history and other risks. Hepatitis C blood test. Hepatitis B blood test. Sexually transmitted disease (STD) testing. Diabetes screening. This is done by checking your blood sugar (glucose) after you have not eaten for a while (fasting). You may have this done every 1-3 years. Discuss your test results, treatment options, and if necessary, the need for more tests with your health care provider. Vaccines  Your health care provider may recommend certain vaccines, such as: Influenza vaccine. This is recommended every year. Tetanus, diphtheria, and acellular pertussis (Tdap, Td) vaccine. You may need a Td booster every 10 years. Zoster vaccine. You may need this after age 65. Pneumococcal 13-valent conjugate (PCV13) vaccine. You may need this if you have certain conditions and have not been vaccinated. Pneumococcal polysaccharide (PPSV23) vaccine. You may need one or two doses if you smoke cigarettes or if you have certain conditions. Talk to your health care provider about which screenings and vaccines you need and how often you need them. This information is not intended to replace advice given to you by your health care provider. Make sure you discuss any questions you have with your  health care provider. Document Released: 03/17/2015 Document Revised: 11/08/2015 Document Reviewed:  12/20/2014 Elsevier Interactive Patient Education  2017 [place] Prevention in the Home Falls can cause injuries. They can happen to people of all ages. There are many things you can do to make your home safe and to help prevent falls. What can I do on the outside of my home? Regularly fix the edges of walkways and driveways and fix any cracks. Remove anything that might make you trip as you walk through a door, such as a raised step or threshold. Trim any bushes or trees on the path to your home. Use bright outdoor lighting. Clear any walking paths of anything that might make someone trip, such as rocks or tools. Regularly check to see if handrails are loose or broken. Make sure that both sides of any steps have handrails. Any raised decks and porches should have guardrails on the edges. Have any leaves, snow, or ice cleared regularly. Use sand or salt on walking paths during winter. Clean up any spills in your garage right away. This includes oil or grease spills. What can I do in the bathroom? Use night lights. Install grab bars by the toilet and in the tub and shower. Do not use towel bars as grab bars. Use non-skid mats or decals in the tub or shower. If you need to sit down in the shower, use a plastic, non-slip stool. Keep the floor dry. Clean up any water that spills on the floor as soon as it happens. Remove soap buildup in the tub or shower regularly. Attach bath mats securely with double-sided non-slip rug tape. Do not have throw rugs and other things on the floor that can make you trip. What can I do in the bedroom? Use night lights. Make sure that you have a light by your bed that is easy to reach. Do not use any sheets or blankets that are too big for your bed. They should not hang down onto the floor. Have a firm chair that has side arms. You can use this for support while you get dressed. Do not have throw rugs and other things on the floor that can make you  trip. What can I do in the kitchen? Clean up any spills right away. Avoid walking on wet floors. Keep items that you use a lot in easy-to-reach places. If you need to reach something above you, use a strong step stool that has a grab bar. Keep electrical cords out of the way. Do not use floor polish or wax that makes floors slippery. If you must use wax, use non-skid floor wax. Do not have throw rugs and other things on the floor that can make you trip. What can I do with my stairs? Do not leave any items on the stairs. Make sure that there are handrails on both sides of the stairs and use them. Fix handrails that are broken or loose. Make sure that handrails are as long as the stairways. Check any carpeting to make sure that it is firmly attached to the stairs. Fix any carpet that is loose or worn. Avoid having throw rugs at the top or bottom of the stairs. If you do have throw rugs, attach them to the floor with carpet tape. Make sure that you have a light switch at the top of the stairs and the bottom of the stairs. If you do not have them, ask someone to add them for you. What else can  I do to help prevent falls? Wear shoes that: Do not have high heels. Have rubber bottoms. Are comfortable and fit you well. Are closed at the toe. Do not wear sandals. If you use a stepladder: Make sure that it is fully opened. Do not climb a closed stepladder. Make sure that both sides of the stepladder are locked into place. Ask someone to hold it for you, if possible. Clearly mark and make sure that you can see: Any grab bars or handrails. First and last steps. Where the edge of each step is. Use tools that help you move around (mobility aids) if they are needed. These include: Canes. Walkers. Scooters. Crutches. Turn on the lights when you go into a dark area. Replace any light bulbs as soon as they burn out. Set up your furniture so you have a clear path. Avoid moving your furniture  around. If any of your floors are uneven, fix them. If there are any pets around you, be aware of where they are. Review your medicines with your doctor. Some medicines can make you feel dizzy. This can increase your chance of falling. Ask your doctor what other things that you can do to help prevent falls. This information is not intended to replace advice given to you by your health care provider. Make sure you discuss any questions you have with your health care provider. Document Released: 12/15/2008 Document Revised: 07/27/2015 Document Reviewed: 03/25/2014 Elsevier Interactive Patient Education  2017 [employer].

## 2021-08-20 NOTE — Progress Notes (Signed)
Subjective:   Kristopher [name] is a 61 y.o. male who presents for Medicare Annual/Subsequent preventive examination.  I connected with  [name] on 08/20/21 by a  telephone enabled telemedicine application and verified that I am speaking with the correct person using two identifiers.   I discussed the limitations of evaluation and management by telemedicine. The patient expressed understanding and agreed to proceed.  Patient location: home  Provider location: Tele-Health-home    Review of Systems     Cardiac Risk Factors include: advanced age (>78mn, >>60women);diabetes mellitus;male gender;obesity (BMI >30kg/m2);sedentary lifestyle     Objective:    Today's Vitals   08/20/21 0832  PainSc: 3    There is no height or weight on file to calculate BMI.     08/20/2021    8:33 AM 08/13/2021    8:20 AM 07/05/2021   10:51 AM 07/05/2021    9:54 AM 04/06/2021   10:04 AM 04/05/2021    2:18 PM 03/16/2021    7:03 AM  Advanced Directives  Does Patient Have a Medical Advance Directive? _0  Yes Yes  Type of AParamedicof [place]Living will [place] will [place]Living will [place]oc[place]Living will HPocaLiving will  Does patient want to make changes to medical advance directive?   No - Patient declined    No - Patient declined  Copy of [place] Chart? No - copy requested  No - copy requested No - copy requested Yes - validated most recent copy scanned in chart (See row information)  Yes - validated most recent copy scanned in chart (See row information)  Would patient like information on creating a medical advance directive?   No - Patient declined No - Patient declined       Current Medications (verified) Outpatient Encounter Medications as of 08/20/2021  Medication Sig   Accu-Chek FastClix Lancets MISC  USE TO TEST BLOOD SUGAR 2X A DAY   albuterol (PROVENTIL) (2.5 MG/3ML) 0.083% nebulizer solution Take 3 mLs (2.5 mg total) by nebulization every 6 (six) hours as needed for wheezing or shortness of breath.   albuterol (VENTOLIN HFA) 108 (90 Base) MCG/ACT inhaler Inhale into the lungs.   amLODipine (NORVASC) 5 MG tablet TAKE 1 TABLET(5 MG) BY MOUTH TWICE DAILY   atorvastatin (LIPITOR) 10 MG tablet Take 1 tablet (10 mg total) by mouth daily at 6 PM.   BREZTRI AEROSPHERE 160-9-4.8 MCG/ACT AERO Inhale 2 puffs into the lungs 2 (two) times daily. Maintenance per pulmonology   diphenhydrAMINE (BENADRYL) 25 MG tablet Take 50 mg by mouth daily.   empagliflozin (JARDIANCE) 25 MG TABS tablet TAKE 1 TABLET(25 MG) BY MOUTH DAILY BEFORE BREAKFAST   esomeprazole (NEXIUM) 40 MG capsule Take 1 capsule (40 mg total) by mouth daily.   glucose blood (ACCU-CHEK GUIDE) test strip USE TO TEST BLOOD SUGAR 2X A DAY   hydrALAZINE (APRESOLINE) 100 MG tablet TAKE 1 TABLET(100 MG) BY MOUTH TWICE DAILY   lidocaine (LIDODERM) 5 % Place 1 patch onto the skin daily. Remove & Discard patch within 12 hours or as directed by MD   metFORMIN (GLUCOPHAGE) 500 MG tablet Take 2 tablets (1,000 mg total) by mouth 2 (two) times daily with a meal.   metoprolol succinate (TOPROL-XL) 100 MG 24 hr tablet TAKE 1 TABLET BY MOUTH TWICE DAILY WITH OR IMMEDIATELY FOLLOWING A MEAL; TO  BE TAKEN WITH 50 MG METOPROLOL TARTRATE   metoprolol tartrate (LOPRESSOR) 50 MG tablet TAKE 1 TABLET BY MOUTH TWICE DAILY. TO BE TAKEN WITH 100 MG METOPROLOL SUCCINATE   montelukast (SINGULAIR) 10 MG tablet Take 10 mg by mouth daily.   morphine (MSIR) 15 MG tablet Take 1 tablet (15 mg total) by mouth every 6 (six) hours as needed for moderate pain or severe pain. Must last 30 days.   [START ON 09/16/2021] morphine (MSIR) 15 MG tablet Take 1 tablet (15 mg total) by mouth every 6 (six) hours as needed for moderate pain or severe pain. Must last 30 days.   [START ON  10/16/2021] morphine (MSIR) 15 MG tablet Take 1 tablet (15 mg total) by mouth every 6 (six) hours as needed for moderate pain or severe pain. Must last 30 days.   Multiple Vitamin (MULTIVITAMIN WITH MINERALS) TABS tablet Take 1 tablet by mouth daily.   NONFORMULARY OR COMPOUNDED ITEM Apply 1-2 mLs topically 4 (four) times daily as needed. 10% Ketamine/2% Cyclobenzaprine/6% Gabapentin Cream   nortriptyline (PAMELOR) 25 MG capsule Take 2 capsules (50 mg total) by mouth at bedtime.   Potassium Chloride ER 20 MEQ TBCR Take 1 tablet by mouth daily.   STIOLTO RESPIMAT 2.5-2.5 MCG/ACT AERS Inhale 2 puffs into the lungs 4 (four) times daily as needed. Prn per pulmonology notes   SUMAtriptan (IMITREX) 50 MG tablet Take 1 tablet (50 mg total) by mouth every 2 (two) hours as needed for migraine. Take 1 tab at onset of migraine. May repeat in 2 hours if headache persists or recurs.   Facility-Administered Encounter Medications as of 08/20/2021  Medication   0.9 %  sodium chloride infusion   sodium chloride flush (NS) 0.9 % injection 10 mL    Allergies (verified) Acetaminophen, Aspirin, Epinephrine, Novocain [procaine], Penicillins, Strawberry extract, and Shellfish allergy   History: Past Medical History:  Diagnosis Date   Allergy    Arthritis    left foot   Benign hypertensive kidney disease    Chronic back pain    Four rods in back   Diabetes mellitus, type 2 (HCC)    Dyspnea    GERD (gastroesophageal reflux disease)    Hypertension    Malignant neoplasm of lung (HCC)    Migraines    daily   Past Surgical History:  Procedure Laterality Date   APPENDECTOMY     BACK SURGERY     COLONOSCOPY WITH PROPOFOL N/A 02/19/2016   Procedure: COLONOSCOPY WITH PROPOFOL;  Surgeon: Lucilla Lame, MD;  Location: [place];  Service: Endoscopy;  Laterality: N/A;   COLONOSCOPY WITH PROPOFOL N/A 01/19/2018   Procedure: COLONOSCOPY WITH PROPOFOL;  Surgeon: Lucilla Lame, MD;  Location: [place];  Service: Endoscopy;  Laterality: N/A;  Diabetic - oral meds   COLONOSCOPY WITH PROPOFOL N/A 03/16/2021   Procedure: COLONOSCOPY WITH PROPOFOL;  Surgeon: Lucilla Lame, MD;  Location: [place];  Service: Endoscopy;  Laterality: N/A;  INADEQUATE PREP   COLONOSCOPY WITH PROPOFOL N/A 04/06/2021   Procedure: COLONOSCOPY WITH PROPOFOL;  Surgeon: Lucilla Lame, MD;  Location: [facility];  Service: Endoscopy;  Laterality: N/A;   DG OPERATIVE LEFT HIP ([place] HX)     10/19   ELECTROMAGNETIC NAVIGATION BROCHOSCOPY Left 11/18/2018   Procedure: ELECTROMAGNETIC NAVIGATION BRONCHOSCOPY;  Surgeon: Tyler Pita, MD;  Location: [facility];  Service: Cardiopulmonary;  Laterality: Left;   FLEXIBLE BRONCHOSCOPY Bilateral 01/20/2019   Procedure: FLEXIBLE BRONCHOSCOPY;  Surgeon: Ottie Glazier, MD;  Location: [facility];  Service: Thoracic;  Laterality: Bilateral;   FLEXIBLE BRONCHOSCOPY Bilateral 01/22/2019   Procedure: FLEXIBLE BRONCHOSCOPY;  Surgeon: Ottie Glazier, MD;  Location: [facility];  Service: Thoracic;  Laterality: Bilateral;   FOOT SURGERY Left    Screws and plates   JOINT REPLACEMENT Left 12/2017   DR Rudene Christians Hip   KNEE SURGERY Left    X 2   LEG SURGERY     LUNG CANCER SURGERY     POLYPECTOMY N/A 02/19/2016   Procedure: POLYPECTOMY;  Surgeon: Lucilla Lame, MD;  Location: [place];  Service: Endoscopy;  Laterality: N/A;   POLYPECTOMY  01/19/2018   Procedure: POLYPECTOMY;  Surgeon: Lucilla Lame, MD;  Location: [place];  Service: Endoscopy;;   PORTACATH PLACEMENT N/A 11/11/2019   Procedure: INSERTION PORT-A-CATH;  Surgeon: Nestor Lewandowsky, MD;  Location: [facility];  Service: General;  Laterality: N/A;   THORACOTOMY Left 01/14/2019   Procedure: THORACOTOMY MAJOR, LEFT;  Surgeon: Nestor Lewandowsky, MD;  Location: [facility];  Service: General;  Laterality: Left;   TOTAL HIP ARTHROPLASTY Left 12/02/2017   Procedure: TOTAL HIP ARTHROPLASTY ANTERIOR APPROACH;  Surgeon:  Hessie Knows, MD;  Location: [facility];  Service: Orthopedics;  Laterality: Left;   VIDEO BRONCHOSCOPY Left 01/14/2019   Procedure: VIDEO BRONCHOSCOPY WITH FLUORO, LEFT;  Surgeon: Nestor Lewandowsky, MD;  Location: [facility];  Service: General;  Laterality: Left;   VIDEO BRONCHOSCOPY WITH ENDOBRONCHIAL NAVIGATION N/A 10/15/2019   Procedure: VIDEO BRONCHOSCOPY WITH ENDOBRONCHIAL NAVIGATION;  Surgeon: Ottie Glazier, MD;  Location: [facility];  Service: Thoracic;  Laterality: N/A;   VIDEO BRONCHOSCOPY WITH ENDOBRONCHIAL ULTRASOUND N/A 10/15/2019   Procedure: VIDEO BRONCHOSCOPY WITH ENDOBRONCHIAL ULTRASOUND;  Surgeon: Ottie Glazier, MD;  Location: [facility];  Service: Thoracic;  Laterality: N/A;   Family History  Problem Relation Age of Onset   Cancer Father    Diabetes Sister    Thrombosis Sister    Social History   Socioeconomic History   Marital status: Significant Other    Spouse name: Not on file   Number of children: Not on file   Years of education: Not on file   Highest education level: Not on file  Occupational History   Occupation: disability  Tobacco Use   Smoking status: Every Day    Packs/day: 0.25    Years: 35.00    Total pack years: 8.75    Types: Cigarettes    Last attempt to quit: 01/14/2019    Years since quitting: 2.6   Smokeless tobacco: Never  Vaping Use   Vaping Use: Never used  Substance and Sexual Activity   Alcohol use: No    Alcohol/week: 0.0 standard drinks of alcohol   Drug use: Yes    Types: Morphine    Comment: prescribed   Sexual activity: Yes  Other Topics Concern   Not on file  Social History Narrative   Not on file   Social Determinants of Health   Financial Resource Strain: Low Risk  (08/20/2021)   Overall Financial Resource Strain (CARDIA)    Difficulty of Paying Living Expenses: Not hard at all  Food Insecurity: No Food Insecurity (08/20/2021)   Hunger Vital Sign    Worried About Running Out of Food in the Last Year: Never true    Ran Out  of Food in the Last Year: Never true  Transportation Needs: No Transportation Needs (08/20/2021)   PRAPARE - Hydrologist (Medical): No    Lack of Transportation (Non-Medical):  No  Physical Activity: Insufficiently Active (08/20/2021)   Exercise Vital Sign    Days of Exercise per Week: 2 days    Minutes of Exercise per Session: 20 min  Stress: No Stress Concern Present (11/24/2020)   [place]    Feeling of Stress : Not at all  Social Connections: Moderately Isolated (08/20/2021)   Social Connection and Isolation Panel [NHANES]    Frequency of Communication with Friends and Family: More than three times a week    Frequency of Social Gatherings with Friends and Family: Twice a week    Attends Religious Services: Never    Marine scientist or Organizations: No    Attends Music therapist: Never    Marital Status: Living with partner    Tobacco Counseling Ready to quit: Not Answered Counseling given: Not Answered   Clinical Intake:  Pre-visit preparation completed: Yes  Pain : 0-10 Pain Score: 3  Pain Type: Chronic pain Pain Location: Back Pain Orientation: Left Pain Descriptors / Indicators: Burning, Constant, Aching Pain Onset: More than a month ago Pain Frequency: Constant Pain Relieving Factors: morphine,  Pain Relieving Factors: morphine,  Nutritional Risks: None Diabetes: Yes CBG done?: No Did pt. bring in CBG monitor from home?: No  How often do you need to have someone help you when you read instructions, pamphlets, or other written materials from your doctor or pharmacy?: 1 - Never  Diabetic?  Yes  Nutrition Risk Assessment:  Has the patient had any N/V/D within the last 2 months?  No  Does the patient have any non-healing wounds?  No  Has the patient had any unintentional weight loss or weight gain?  No   Diabetes:  Is the patient diabetic?   Yes  If diabetic, was a CBG obtained today?  No  Did the patient bring in their glucometer from home?  No  How often do you monitor your CBG's? 1 x a day.   Financial Strains and Diabetes Management:  Are you having any financial strains with the device, your supplies or your medication? No .  Does the patient want to be seen by Chronic Care Management for management of their diabetes?  No  Would the patient like to be referred to a Nutritionist or for Diabetic Management?  No   Diabetic Exams:  Diabetic Eye Exam:. Overdue for diabetic eye exam. Pt has been advised about the importance in completing this exam.   Diabetic Foot Exam: Pt has been advised about the importance in completing this exam.   Interpreter Needed?: No  Information entered by :: Leroy Kennedy LPN   Activities of Daily Living    08/20/2021    8:43 AM 04/06/2021   10:08 AM  In your present state of health, do you have any difficulty performing the following activities:  Hearing? 0 0  Vision? 0 0  Difficulty concentrating or making decisions? 0 0  Walking or climbing stairs? 0 0  Dressing or bathing? 0 0  Doing errands, shopping? 0   Preparing Food and eating ? N   Using the Toilet? N   In the past six months, have you accidently leaked urine? N   Do you have problems with loss of bowel control? N   Managing your Medications? N   Managing your Finances? N   Housekeeping or managing your Housekeeping? N     Patient Care Team: [name], DO as PCP - General (Family  Medicine) Gavin Pound, CMA (Inactive) as Certified Medical Assistant Telford Nab, RN as Registered Nurse Vanita Ingles, RN as Case Manager (General Practice) [name], MD as Referring Physician (Pain Medicine) [name], MD as Consulting Physician (Oncology) Noreene Filbert, MD as Consulting Physician (Radiation Oncology)  Indicate any recent Medical Services you may have received from other than [facility]  providers in the past year (date may be approximate).     Assessment:   This is a routine wellness examination for Vito.  Hearing/Vision screen Hearing Screening - Comments:: No trouble hearing Vision Screening - Comments:: Not up date Unsure of name  Dietary issues and exercise activities discussed: Current Exercise Habits: Home exercise routine, Time (Minutes): 15, Frequency (Times/Week): 2, Weekly Exercise (Minutes/Week): 30, Intensity: Mild, Exercise limited by: orthopedic condition(s)   Goals Addressed             This Visit's Progress    Patient Stated       Get over this cancer       Depression Screen    08/20/2021    8:42 AM 08/13/2021    8:20 AM 08/18/2020    8:22 AM 05/22/2020    8:15 AM 02/23/2020   10:02 AM 10/13/2019   11:10 AM 08/11/2019    8:31 AM  PHQ 2/9 Scores  PHQ - 2 Score 0 0 0 0 0 0 0    Fall Risk    08/20/2021    8:33 AM 08/13/2021    8:20 AM 07/12/2021   10:28 AM 05/14/2021    8:19 AM 02/14/2021    8:24 AM  Fall Risk   Falls in the past year? 0 0 0 0 0  Number falls in past yr: 0   0   Injury with Fall? 0      Risk for fall due to :    No Fall Risks   Follow up Falls evaluation completed;Education provided;Falls prevention discussed   Falls evaluation completed     FALL RISK PREVENTION PERTAINING TO THE HOME:  Any stairs in or around the home? No  If so, are there any without handrails? No  Home free of loose throw rugs in walkways, pet beds, electrical cords, etc? Yes  Adequate lighting in your home to reduce risk of falls? Yes   ASSISTIVE DEVICES UTILIZED TO PREVENT FALLS:  Life alert? No  Use of a cane, walker or w/c? No  Grab bars in the bathroom? Yes  Shower chair or bench in shower? No  Elevated toilet seat or a handicapped toilet? No   TIMED UP AND GO:  Was the test performed? No .    Cognitive Function:        08/20/2021    8:34 AM 08/18/2020    8:24 AM 08/04/2017    8:12 AM 06/24/2016    9:44 AM  6CIT Screen   What Year? 0 points 0 points 0 points 0 points  What month? 0 points 0 points 0 points 0 points  What time? 0 points 0 points 0 points 0 points  Count back from 20 2 points 0 points 0 points 0 points  Months in reverse 4 points 4 points 0 points 4 points  Repeat phrase 2 points 6 points 2 points 4 points  Total Score 8 points 10 points 2 points 8 points    Immunizations Immunization History  Administered Date(s) Administered   Pneumococcal Polysaccharide-23 06/24/2016   Tdap 03/04/2013, 06/03/2017    TDAP status: Up to  date  Flu Vaccine status: Due, Education has been provided regarding the importance of this vaccine. Advised may receive this vaccine at local pharmacy or Health Dept. Aware to provide a copy of the vaccination record if obtained from local pharmacy or Health Dept. Verbalized acceptance and understanding.  Pneumococcal vaccine status: Up to date  Covid-19 vaccine status: Information provided on how to obtain vaccines.   Qualifies for Shingles Vaccine? Yes   Zostavax completed No   Shingrix Completed?: No.    Education has been provided regarding the importance of this vaccine. Patient has been advised to call insurance company to determine out of pocket expense if they have not yet received this vaccine. Advised may also receive vaccine at local pharmacy or Health Dept. Verbalized acceptance and understanding.  Screening Tests Health Maintenance  Topic Date Due   OPHTHALMOLOGY EXAM  02/18/2019   FOOT EXAM  08/24/2020   Zoster Vaccines- Shingrix (1 of 2) 11/20/2021 (Originally 01/29/1980)   HEMOGLOBIN A1C  09/05/2021   INFLUENZA VACCINE  10/02/2021   COLONOSCOPY (Pts 45-66yr Insurance coverage will need to be confirmed)  04/06/2026   TETANUS/TDAP  06/04/2027   Hepatitis C Screening  Completed   HIV Screening  Completed   HPV VACCINES  Aged Out   COVID-19 Vaccine  Discontinued    Health Maintenance  Health Maintenance Due  Topic Date Due   OPHTHALMOLOGY  EXAM  02/18/2019   FOOT EXAM  08/24/2020    Colorectal cancer screening: Type of screening: Colonoscopy. Completed 2023. Repeat every 5 years  Lung Cancer Screening: (Low Dose CT Chest recommended if Age 60-80years, 30 pack-year currently smoking OR have quit w/in 15years.) does not qualify.   Lung Cancer Screening Referral:   Additional Screening:  Hepatitis C Screening: does not qualify; Completed 2018  Vision Screening: Recommended annual ophthalmology exams for early detection of glaucoma and other disorders of the eye. Is the patient up to date with their annual eye exam?  No  Who is the provider or what is the name of the office in which the patient attends annual eye exams? Unsure of name If pt is not established with a provider, would they like to be referred to a provider to establish care? No .   Dental Screening: Recommended annual dental exams for proper oral hygiene  Community Resource Referral / Chronic Care Management: CRR required this visit?  No   CCM required this visit?  No      Plan:     I have personally reviewed and noted the following in the patient's chart:   Medical and social history Use of alcohol, tobacco or illicit drugs  Current medications and supplements including opioid prescriptions. Patient is currently taking opioid prescriptions. Information provided to patient regarding non-opioid alternatives. Patient advised to discuss non-opioid treatment plan with their provider. Functional ability and status Nutritional status Physical activity Advanced directives List of other physicians Hospitalizations, surgeries, and ER visits in previous 12 months Vitals Screenings to include cognitive, depression, and falls Referrals and appointments  In addition, I have reviewed and discussed with patient certain preventive protocols, quality metrics, and best practice recommendations. A written personalized care plan for preventive services as well as  general preventive health recommendations were provided to patient.     JLeroy Kennedy LPN   [id number]  Nurse Notes:

## 2021-08-20 NOTE — Telephone Encounter (Signed)
Requested Prescriptions  Pending Prescriptions Disp Refills  . esomeprazole (NEXIUM) 40 MG capsule [Pharmacy Med Name: ESOMEPRAZOLE MAGNESIUM 40MG  DR CAPS] 90 capsule 3    Sig: TAKE 1 CAPSULE(40 MG) BY MOUTH DAILY     Gastroenterology: Proton Pump Inhibitors 2 Failed - 08/18/2021  8:04 AM      Failed - ALT in normal range and within 360 days    ALT  Date Value Ref Range Status  07/05/2021 58 (H) 0 - 44 U/L Final         Failed - AST in normal range and within 360 days    AST  Date Value Ref Range Status  07/05/2021 54 (H) 15 - 41 U/L Final         Passed - Valid encounter within last 12 months    Recent Outpatient Visits          3 months ago COPD exacerbation ([place])   [place], [name], DO   3 months ago COPD exacerbation ([place])   [place], [name], DO   4 months ago Sinus congestion   [place], [place] T, NP   5 months ago Skin lesion of back   [employer], [name], DO   5 months ago Type 2 diabetes mellitus with stage 1 chronic kidney disease, without long-term current use of insulin ([place])   [place], [place], DO      Future Appointments            In 2 weeks Wynetta Emery, Barb Merino, DO [employer], [name]

## 2021-08-25 DIAGNOSIS — J430 Unilateral pulmonary emphysema [MacLeod's syndrome]: Secondary | ICD-10-CM | POA: Diagnosis not present

## 2021-08-29 ENCOUNTER — Telehealth: Payer: Self-pay | Admitting: Family Medicine

## 2021-08-29 LAB — HM DIABETES EYE EXAM

## 2021-08-29 NOTE — Telephone Encounter (Signed)
Kristopher [name] brought in a parking placard to be completed by the provider. Application was placed in the provider folder. Please call pt when ready to be picked up.

## 2021-08-29 NOTE — Telephone Encounter (Signed)
Attempted to contact patient, NA LVM making patient aware form is ready for pick up.

## 2021-09-07 ENCOUNTER — Encounter: Payer: Self-pay | Admitting: Family Medicine

## 2021-09-07 ENCOUNTER — Ambulatory Visit (INDEPENDENT_AMBULATORY_CARE_PROVIDER_SITE_OTHER): Payer: Medicare Other | Admitting: Family Medicine

## 2021-09-07 VITALS — BP 120/75 | HR 69 | Temp 98.3°F | Wt 229.0 lb

## 2021-09-07 DIAGNOSIS — J439 Emphysema, unspecified: Secondary | ICD-10-CM | POA: Diagnosis not present

## 2021-09-07 DIAGNOSIS — I700 Atherosclerosis of aorta: Secondary | ICD-10-CM

## 2021-09-07 DIAGNOSIS — E785 Hyperlipidemia, unspecified: Secondary | ICD-10-CM

## 2021-09-07 DIAGNOSIS — I129 Hypertensive chronic kidney disease with stage 1 through stage 4 chronic kidney disease, or unspecified chronic kidney disease: Secondary | ICD-10-CM | POA: Diagnosis not present

## 2021-09-07 DIAGNOSIS — C3492 Malignant neoplasm of unspecified part of left bronchus or lung: Secondary | ICD-10-CM | POA: Diagnosis not present

## 2021-09-07 DIAGNOSIS — Z0000 Encounter for general adult medical examination without abnormal findings: Secondary | ICD-10-CM

## 2021-09-07 DIAGNOSIS — C3490 Malignant neoplasm of unspecified part of unspecified bronchus or lung: Secondary | ICD-10-CM

## 2021-09-07 DIAGNOSIS — E1122 Type 2 diabetes mellitus with diabetic chronic kidney disease: Secondary | ICD-10-CM

## 2021-09-07 DIAGNOSIS — E1169 Type 2 diabetes mellitus with other specified complication: Secondary | ICD-10-CM

## 2021-09-07 DIAGNOSIS — R3911 Hesitancy of micturition: Secondary | ICD-10-CM | POA: Diagnosis not present

## 2021-09-07 DIAGNOSIS — N181 Chronic kidney disease, stage 1: Secondary | ICD-10-CM

## 2021-09-07 DIAGNOSIS — F1120 Opioid dependence, uncomplicated: Secondary | ICD-10-CM

## 2021-09-07 DIAGNOSIS — G43009 Migraine without aura, not intractable, without status migrainosus: Secondary | ICD-10-CM

## 2021-09-07 LAB — MICROALBUMIN, URINE WAIVED
Creatinine, Urine Waived: 50 mg/dL (ref 10–300)
Microalb, Ur Waived: 30 mg/L — ABNORMAL HIGH (ref 0–19)

## 2021-09-07 LAB — BAYER DCA HB A1C WAIVED: HB A1C (BAYER DCA - WAIVED): 7.9 % — ABNORMAL HIGH (ref 4.8–5.6)

## 2021-09-07 MED ORDER — ATORVASTATIN CALCIUM 10 MG PO TABS: 10.0000 mg | ORAL_TABLET | Freq: Every day | ORAL | 1 refills | Status: DC

## 2021-09-07 MED ORDER — METOPROLOL TARTRATE 50 MG PO TABS: ORAL_TABLET | ORAL | 1 refills | Status: DC

## 2021-09-07 MED ORDER — ESOMEPRAZOLE MAGNESIUM 40 MG PO CPDR: DELAYED_RELEASE_CAPSULE | ORAL | 3 refills | Status: DC

## 2021-09-07 MED ORDER — ALBUTEROL SULFATE (2.5 MG/3ML) 0.083% IN NEBU: 2.5000 mg | INHALATION_SOLUTION | Freq: Four times a day (QID) | RESPIRATORY_TRACT | 1 refills | Status: DC | PRN

## 2021-09-07 MED ORDER — NORTRIPTYLINE HCL 25 MG PO CAPS: 50.0000 mg | ORAL_CAPSULE | Freq: Every day | ORAL | 1 refills | Status: DC

## 2021-09-07 MED ORDER — POTASSIUM CHLORIDE ER 20 MEQ PO TBCR: 1.0000 | EXTENDED_RELEASE_TABLET | Freq: Every day | ORAL | 1 refills | Status: DC

## 2021-09-07 MED ORDER — EMPAGLIFLOZIN 25 MG PO TABS: ORAL_TABLET | ORAL | 1 refills | Status: DC

## 2021-09-07 MED ORDER — SUMATRIPTAN SUCCINATE 50 MG PO TABS: 50.0000 mg | ORAL_TABLET | ORAL | 12 refills | Status: DC | PRN

## 2021-09-07 MED ORDER — MONTELUKAST SODIUM 10 MG PO TABS: 10.0000 mg | ORAL_TABLET | Freq: Every day | ORAL | 1 refills | Status: DC

## 2021-09-07 MED ORDER — STIOLTO RESPIMAT 2.5-2.5 MCG/ACT IN AERS: 2.0000 | INHALATION_SPRAY | Freq: Four times a day (QID) | RESPIRATORY_TRACT | 12 refills | Status: DC | PRN

## 2021-09-07 MED ORDER — METOPROLOL SUCCINATE ER 100 MG PO TB24: ORAL_TABLET | ORAL | 1 refills | Status: DC

## 2021-09-07 MED ORDER — HYDRALAZINE HCL 100 MG PO TABS: 100.0000 mg | ORAL_TABLET | Freq: Two times a day (BID) | ORAL | 1 refills | Status: DC

## 2021-09-07 MED ORDER — AMLODIPINE BESYLATE 5 MG PO TABS: ORAL_TABLET | ORAL | 1 refills | Status: DC

## 2021-09-07 MED ORDER — METFORMIN HCL 500 MG PO TABS: 1000.0000 mg | ORAL_TABLET | Freq: Two times a day (BID) | ORAL | 1 refills | Status: DC

## 2021-09-07 NOTE — Assessment & Plan Note (Signed)
Stable. Continue to follow with oncology. Call with any concerns.

## 2021-09-07 NOTE — Assessment & Plan Note (Signed)
Up a bit with A1c of 7.9 from 6.8. Will really work on his diet and recheck 3 months. Call with any concerns. Refills given today.

## 2021-09-07 NOTE — Assessment & Plan Note (Signed)
Continues to follow with pain management. Stable. Call with any concerns.

## 2021-09-07 NOTE — Assessment & Plan Note (Signed)
Under good control on current regimen. Continue current regimen. Continue to monitor. Call with any concerns. Refills given.   

## 2021-09-07 NOTE — Assessment & Plan Note (Signed)
Will keep BP, cholesterol and sugars under good control. Continue to monitor. Call with any concerns.

## 2021-09-07 NOTE — Assessment & Plan Note (Signed)
Under good control on current regimen. Continue current regimen. Continue to monitor. Call with any concerns. Refills given. Labs drawn today.   

## 2021-09-07 NOTE — Progress Notes (Signed)
BP 120/75   Pulse 69   Temp 98.3 F (36.8 C)   Wt 229 lb (103.9 kg)   SpO2 97%   BMI 32.86 kg/m    Subjective:    Patient ID: Kristopher [name], male    DOB: Mar 29, 1960, 61 y.o.   MRN: [id number]  HPI: Kristopher [name] is a 61 y.o. male presenting on 09/07/2021 for comprehensive medical examination. Current medical complaints include:  DIABETES Hypoglycemic episodes:no Polydipsia/polyuria: no Visual disturbance: yes Chest pain: no Paresthesias: yes Glucose Monitoring: no  Accucheck frequency: Not Checking Taking Insulin?: no Blood Pressure Monitoring: not checking Retinal Examination: Up to Date Foot Exam: Up to Date Diabetic Education: Completed Pneumovax: Up to Date Influenza: Up to Date Aspirin: no  HYPERTENSION / HYPERLIPIDEMIA Satisfied with current treatment? yes Duration of hypertension: chronic BP monitoring frequency: not checking BP medication side effects: no Duration of hyperlipidemia: chronic Cholesterol medication side effects: no Cholesterol supplements: none Past cholesterol medications: atorvastatin Medication compliance: excellent compliance Aspirin: no Recent stressors: no Recurrent headaches: no Visual changes: no Palpitations: no Dyspnea: no Chest pain: no Lower extremity edema: no Dizzy/lightheaded: no  COPD COPD status: controlled Satisfied with current treatment?: yes Oxygen use: no Dyspnea frequency: occasionally Cough frequency: occasionally Rescue inhaler frequency:  occasionally Limitation of activity: no Productive cough: no Pneumovax: Up to Date Influenza: Up to Date   He currently lives with: wife Interim Problems from his last visit: no  Depression Screen done today and results listed below:     09/07/2021   10:08 AM 08/20/2021    8:42 AM 08/13/2021    8:20 AM 08/18/2020    8:22 AM 05/22/2020    8:15 AM  Depression screen PHQ 2/9  Decreased Interest 0 0 0 0 0  Down, Depressed, Hopeless 0 0 0 0 0  PHQ - 2 Score 0  0 0 0 0    Past Medical History:  Past Medical History:  Diagnosis Date   Allergy    Arthritis    left foot   Benign hypertensive kidney disease    Chronic back pain    Four rods in back   Diabetes mellitus, type 2 (HCC)    Dyspnea    GERD (gastroesophageal reflux disease)    Hypertension    Malignant neoplasm of lung (HCC)    Migraines    daily    Surgical History:  Past Surgical History:  Procedure Laterality Date   APPENDECTOMY     BACK SURGERY     COLONOSCOPY WITH PROPOFOL N/A 02/19/2016   Procedure: COLONOSCOPY WITH PROPOFOL;  Surgeon: Lucilla Lame, MD;  Location: [place];  Service: Endoscopy;  Laterality: N/A;   COLONOSCOPY WITH PROPOFOL N/A 01/19/2018   Procedure: COLONOSCOPY WITH PROPOFOL;  Surgeon: Lucilla Lame, MD;  Location: [place];  Service: Endoscopy;  Laterality: N/A;  Diabetic - oral meds   COLONOSCOPY WITH PROPOFOL N/A 03/16/2021   Procedure: COLONOSCOPY WITH PROPOFOL;  Surgeon: Lucilla Lame, MD;  Location: [place];  Service: Endoscopy;  Laterality: N/A;  INADEQUATE PREP   COLONOSCOPY WITH PROPOFOL N/A 04/06/2021   Procedure: COLONOSCOPY WITH PROPOFOL;  Surgeon: Lucilla Lame, MD;  Location: [place];  Service: Endoscopy;  Laterality: N/A;   DG OPERATIVE LEFT HIP ([place] HX)     10/19   ELECTROMAGNETIC NAVIGATION BROCHOSCOPY Left 11/18/2018   Procedure: ELECTROMAGNETIC NAVIGATION BRONCHOSCOPY;  Surgeon: Tyler Pita, MD;  Location: [facility];  Service: Cardiopulmonary;  Laterality: Left;   FLEXIBLE BRONCHOSCOPY Bilateral  01/20/2019   Procedure: FLEXIBLE BRONCHOSCOPY;  Surgeon: Ottie Glazier, MD;  Location: [facility];  Service: Thoracic;  Laterality: Bilateral;   FLEXIBLE BRONCHOSCOPY Bilateral 01/22/2019   Procedure: FLEXIBLE BRONCHOSCOPY;  Surgeon: Ottie Glazier, MD;  Location: [facility];  Service: Thoracic;  Laterality: Bilateral;   FOOT SURGERY Left    Screws and plates   JOINT REPLACEMENT Left 12/2017   DR  Rudene Christians Hip   KNEE SURGERY Left    X 2   LEG SURGERY     LUNG CANCER SURGERY     POLYPECTOMY N/A 02/19/2016   Procedure: POLYPECTOMY;  Surgeon: Lucilla Lame, MD;  Location: [place];  Service: Endoscopy;  Laterality: N/A;   POLYPECTOMY  01/19/2018   Procedure: POLYPECTOMY;  Surgeon: Lucilla Lame, MD;  Location: [place];  Service: Endoscopy;;   PORTACATH PLACEMENT N/A 11/11/2019   Procedure: INSERTION PORT-A-CATH;  Surgeon: Nestor Lewandowsky, MD;  Location: [facility];  Service: General;  Laterality: N/A;   THORACOTOMY Left 01/14/2019   Procedure: THORACOTOMY MAJOR, LEFT;  Surgeon: Nestor Lewandowsky, MD;  Location: [facility];  Service: General;  Laterality: Left;   TOTAL HIP ARTHROPLASTY Left 12/02/2017   Procedure: TOTAL HIP ARTHROPLASTY ANTERIOR APPROACH;  Surgeon: Hessie Knows, MD;  Location: [facility];  Service: Orthopedics;  Laterality: Left;   VIDEO BRONCHOSCOPY Left 01/14/2019   Procedure: VIDEO BRONCHOSCOPY WITH FLUORO, LEFT;  Surgeon: Nestor Lewandowsky, MD;  Location: [facility];  Service: General;  Laterality: Left;   VIDEO BRONCHOSCOPY WITH ENDOBRONCHIAL NAVIGATION N/A 10/15/2019   Procedure: VIDEO BRONCHOSCOPY WITH ENDOBRONCHIAL NAVIGATION;  Surgeon: Ottie Glazier, MD;  Location: [facility];  Service: Thoracic;  Laterality: N/A;   VIDEO BRONCHOSCOPY WITH ENDOBRONCHIAL ULTRASOUND N/A 10/15/2019   Procedure: VIDEO BRONCHOSCOPY WITH ENDOBRONCHIAL ULTRASOUND;  Surgeon: Ottie Glazier, MD;  Location: [facility];  Service: Thoracic;  Laterality: N/A;    Medications:  Current Outpatient Medications on File Prior to Visit  Medication Sig   Accu-Chek FastClix Lancets MISC USE TO TEST BLOOD SUGAR 2X A DAY   albuterol (VENTOLIN HFA) 108 (90 Base) MCG/ACT inhaler Inhale into the lungs.   BREZTRI AEROSPHERE 160-9-4.8 MCG/ACT AERO Inhale 2 puffs into the lungs 2 (two) times daily. Maintenance per pulmonology   diphenhydrAMINE (BENADRYL) 25 MG tablet Take 50 mg by mouth daily.   glucose blood  (ACCU-CHEK GUIDE) test strip USE TO TEST BLOOD SUGAR 2X A DAY   lidocaine (LIDODERM) 5 % Place 1 patch onto the skin daily. Remove & Discard patch within 12 hours or as directed by MD   morphine (MSIR) 15 MG tablet Take 1 tablet (15 mg total) by mouth every 6 (six) hours as needed for moderate pain or severe pain. Must last 30 days.   [START ON 09/16/2021] morphine (MSIR) 15 MG tablet Take 1 tablet (15 mg total) by mouth every 6 (six) hours as needed for moderate pain or severe pain. Must last 30 days.   [START ON 10/16/2021] morphine (MSIR) 15 MG tablet Take 1 tablet (15 mg total) by mouth every 6 (six) hours as needed for moderate pain or severe pain. Must last 30 days.   Multiple Vitamin (MULTIVITAMIN WITH MINERALS) TABS tablet Take 1 tablet by mouth daily.   NONFORMULARY OR COMPOUNDED ITEM Apply 1-2 mLs topically 4 (four) times daily as needed. 10% Ketamine/2% Cyclobenzaprine/6% Gabapentin Cream   Current Facility-Administered Medications on File Prior to Visit  Medication   0.9 %  sodium chloride infusion   sodium chloride flush (NS) 0.9 % injection 10 mL  Allergies:  Allergies  Allergen Reactions   Acetaminophen Swelling   Aspirin Anaphylaxis   Epinephrine Anaphylaxis    Does not include albuterol   Novocain [Procaine] Anaphylaxis   Penicillins Anaphylaxis    Has patient had a PCN reaction causing immediate rash, facial/tongue/throat swelling, SOB or lightheadedness with hypotension: Yes Has patient had a PCN reaction causing severe rash involving mucus membranes or skin necrosis: No Has patient had a PCN reaction that required hospitalization: Yes Has patient had a PCN reaction occurring within the last 10 years: No If all of the above answers are "NO", then may proceed with Cephalosporin use.    Strawberry Extract Anaphylaxis   Shellfish Allergy Hives and Nausea And Vomiting    Social History:  Social History   Socioeconomic History   Marital status: Significant Other     Spouse name: Not on file   Number of children: Not on file   Years of education: Not on file   Highest education level: Not on file  Occupational History   Occupation: disability  Tobacco Use   Smoking status: Every Day    Packs/day: 0.25    Years: 35.00    Total pack years: 8.75    Types: Cigarettes    Last attempt to quit: 01/14/2019    Years since quitting: 2.6   Smokeless tobacco: Never  Vaping Use   Vaping Use: Never used  Substance and Sexual Activity   Alcohol use: No    Alcohol/week: 0.0 standard drinks of alcohol   Drug use: Yes    Types: Morphine    Comment: prescribed   Sexual activity: Yes  Other Topics Concern   Not on file  Social History Narrative   Not on file   Social Determinants of Health   Financial Resource Strain: Low Risk  (08/20/2021)   Overall Financial Resource Strain (CARDIA)    Difficulty of Paying Living Expenses: Not hard at all  Food Insecurity: No Food Insecurity (08/20/2021)   Hunger Vital Sign    Worried About Running Out of Food in the Last Year: Never true    Ran Out of Food in the Last Year: Never true  Transportation Needs: No Transportation Needs (08/20/2021)   PRAPARE - Hydrologist (Medical): No    Lack of Transportation (Non-Medical): No  Physical Activity: Insufficiently Active (08/20/2021)   Exercise Vital Sign    Days of Exercise per Week: 2 days    Minutes of Exercise per Session: 20 min  Stress: No Stress Concern Present (11/24/2020)   [place]    Feeling of Stress : Not at all  Social Connections: Moderately Isolated (08/20/2021)   Social Connection and Isolation Panel [NHANES]    Frequency of Communication with Friends and Family: More than three times a week    Frequency of Social Gatherings with Friends and Family: Twice a week    Attends Religious Services: Never    Marine scientist or Organizations: No    Attends  Archivist Meetings: Never    Marital Status: Living with partner  Intimate Partner Violence: Not At Risk (08/20/2021)   Humiliation, Afraid, Rape, and Kick questionnaire    Fear of Current or Ex-Partner: No    Emotionally Abused: No    Physically Abused: No    Sexually Abused: No   Social History   Tobacco Use  Smoking Status Every Day   Packs/day: 0.25   Years:  35.00   Total pack years: 8.75   Types: Cigarettes   Last attempt to quit: 01/14/2019   Years since quitting: 2.6  Smokeless Tobacco Never   Social History   Substance and Sexual Activity  Alcohol Use No   Alcohol/week: 0.0 standard drinks of alcohol    Family History:  Family History  Problem Relation Age of Onset   Cancer Father    Diabetes Sister    Thrombosis Sister     Past medical history, surgical history, medications, allergies, family history and social history reviewed with patient today and changes made to appropriate areas of the chart.   Review of Systems  Constitutional: Negative.   HENT: Negative.    Eyes: Negative.   Respiratory: Negative.    Cardiovascular: Negative.   Gastrointestinal: Negative.   Genitourinary: Negative.   Musculoskeletal: Negative.   Skin: Negative.   Neurological: Negative.   Endo/Heme/Allergies:  Positive for environmental allergies. Negative for polydipsia. Does not bruise/bleed easily.  Psychiatric/Behavioral: Negative.     All other ROS negative except what is listed above and in the HPI.      Objective:    BP 120/75   Pulse 69   Temp 98.3 F (36.8 C)   Wt 229 lb (103.9 kg)   SpO2 97%   BMI 32.86 kg/m   Wt Readings from Last 3 Encounters:  09/07/21 229 lb (103.9 kg)  08/13/21 215 lb (97.5 kg)  07/19/21 221 lb 9.6 oz (100.5 kg)    Physical Exam Vitals and nursing note reviewed.  Constitutional:      General: He is not in acute distress.    Appearance: Normal appearance. He is obese. He is not ill-appearing, toxic-appearing or  diaphoretic.  HENT:     Head: Normocephalic and atraumatic.     Right Ear: Tympanic membrane, ear canal and external ear normal. There is no impacted cerumen.     Left Ear: Tympanic membrane, ear canal and external ear normal. There is no impacted cerumen.     Nose: Nose normal. No congestion or rhinorrhea.     Mouth/Throat:     Mouth: Mucous membranes are moist.     Pharynx: Oropharynx is clear. No oropharyngeal exudate or posterior oropharyngeal erythema.  Eyes:     General: No scleral icterus.       Right eye: No discharge.        Left eye: No discharge.     Extraocular Movements: Extraocular movements intact.     Conjunctiva/sclera: Conjunctivae normal.     Pupils: Pupils are equal, round, and reactive to light.  Neck:     Vascular: No carotid bruit.  Cardiovascular:     Rate and Rhythm: Normal rate and regular rhythm.     Pulses: Normal pulses.     Heart sounds: No murmur heard.    No friction rub. No gallop.  Pulmonary:     Effort: Pulmonary effort is normal. No respiratory distress.     Breath sounds: Normal breath sounds. No stridor. No wheezing, rhonchi or rales.  Chest:     Chest wall: No tenderness.  Abdominal:     General: Abdomen is flat. Bowel sounds are normal. There is no distension.     Palpations: Abdomen is soft. There is no mass.     Tenderness: There is no abdominal tenderness. There is no right CVA tenderness, left CVA tenderness, guarding or rebound.     Hernia: No hernia is present.  Genitourinary:    Comments: Genital exam  deferred with shared decision making Musculoskeletal:        General: No swelling, tenderness, deformity or signs of injury. Normal range of motion.     Cervical back: Normal range of motion and neck supple. No rigidity. No muscular tenderness.     Right lower leg: No edema.     Left lower leg: No edema.  Lymphadenopathy:     Cervical: No cervical adenopathy.  Skin:    General: Skin is warm and dry.     Capillary Refill:  Capillary refill takes less than 2 seconds.     Coloration: Skin is not jaundiced or pale.     Findings: No bruising, erythema, lesion or rash.  Neurological:     General: No focal deficit present.     Mental Status: He is alert and oriented to person, place, and time.     Cranial Nerves: No cranial nerve deficit.     Sensory: No sensory deficit.     Motor: No weakness.     Coordination: Coordination normal.     Gait: Gait normal.     Deep Tendon Reflexes: Reflexes normal.  Psychiatric:        Mood and Affect: Mood normal.        Behavior: Behavior normal.        Thought Content: Thought content normal.        Judgment: Judgment normal.     Results for orders placed or performed in visit on 09/06/21  HM DIABETES EYE EXAM  Result Value Ref Range   HM Diabetic Eye Exam No Retinopathy No Retinopathy      Assessment & Plan:   Problem List Items Addressed This Visit       Cardiovascular and Mediastinum   Migraine    Under good control on current regimen. Continue current regimen. Continue to monitor. Call with any concerns. Refills given.        Relevant Medications   atorvastatin (LIPITOR) 10 MG tablet   amLODipine (NORVASC) 5 MG tablet   hydrALAZINE (APRESOLINE) 100 MG tablet   metoprolol succinate (TOPROL-XL) 100 MG 24 hr tablet   metoprolol tartrate (LOPRESSOR) 50 MG tablet   SUMAtriptan (IMITREX) 50 MG tablet   nortriptyline (PAMELOR) 25 MG capsule   Aortic atherosclerosis (HCC)    Will keep BP, cholesterol and sugars under good control. Continue to monitor. Call with any concerns.       Relevant Medications   atorvastatin (LIPITOR) 10 MG tablet   amLODipine (NORVASC) 5 MG tablet   hydrALAZINE (APRESOLINE) 100 MG tablet   metoprolol succinate (TOPROL-XL) 100 MG 24 hr tablet   metoprolol tartrate (LOPRESSOR) 50 MG tablet   Other Relevant Orders   Comprehensive metabolic panel   CBC with Differential/Platelet     Respiratory   Squamous cell carcinoma of left  lung (HCC)    Stable. Continue to follow with oncology. Call with any concerns.       Pulmonary emphysema ([place])    Under good control on current regimen. Continue current regimen. Continue to monitor. Call with any concerns. Refills given.        Relevant Medications   albuterol (PROVENTIL) (2.5 MG/3ML) 0.083% nebulizer solution   STIOLTO RESPIMAT 2.5-2.5 MCG/ACT AERS   montelukast (SINGULAIR) 10 MG tablet   Malignant neoplasm of lung (HCC)    Stable. Continue to follow with oncology. Call with any concerns.         Endocrine   Type 2 diabetes mellitus with stage 1 chronic  kidney disease, without long-term current use of insulin ([place])    Up a bit with A1c of 7.9 from 6.8. Will really work on his diet and recheck 3 months. Call with any concerns. Refills given today.       Relevant Medications   atorvastatin (LIPITOR) 10 MG tablet   empagliflozin (JARDIANCE) 25 MG TABS tablet   metFORMIN (GLUCOPHAGE) 500 MG tablet   Other Relevant Orders   Comprehensive metabolic panel   CBC with Differential/Platelet   Bayer DCA Hb A1c Waived   Hyperlipidemia associated with type 2 diabetes mellitus ([employer])    Under good control on current regimen. Continue current regimen. Continue to monitor. Call with any concerns. Refills given. Labs drawn today.       Relevant Medications   atorvastatin (LIPITOR) 10 MG tablet   amLODipine (NORVASC) 5 MG tablet   empagliflozin (JARDIANCE) 25 MG TABS tablet   hydrALAZINE (APRESOLINE) 100 MG tablet   metFORMIN (GLUCOPHAGE) 500 MG tablet   metoprolol succinate (TOPROL-XL) 100 MG 24 hr tablet   metoprolol tartrate (LOPRESSOR) 50 MG tablet   Other Relevant Orders   Comprehensive metabolic panel   CBC with Differential/Platelet   Lipid Panel w/o Chol/HDL Ratio     Genitourinary   Benign hypertensive renal disease    Under good control on current regimen. Continue current regimen. Continue to monitor. Call with any concerns. Refills given. Labs drawn  today.       Relevant Orders   Comprehensive metabolic panel   CBC with Differential/Platelet   Microalbumin, Urine Waived   TSH     Other   Uncomplicated opioid dependence ([place]) (Chronic)    Continues to follow with pain management. Stable. Call with any concerns.       Other Visit Diagnoses     Routine general medical examination at a health care facility    -  Primary   Vaccines up to date. Screening labs checked today. Colonoscopy up to date. Continue diet and exercise. Call with any concerns.    Hesitancy       Labs drawn today. Await results. Treat as needed.    Relevant Orders   PSA        Discussed aspirin prophylaxis for myocardial infarction prevention and decision was it was not indicated  LABORATORY TESTING:  Health maintenance labs ordered today as discussed above.   The natural history of prostate cancer and ongoing controversy regarding screening and potential treatment outcomes of prostate cancer has been discussed with the patient. The meaning of a false positive PSA and a false negative PSA has been discussed. He indicates understanding of the limitations of this screening test and wishes to proceed with screening PSA testing.   IMMUNIZATIONS:   - Tdap: Tetanus vaccination status reviewed: last tetanus booster within 10 years. - Influenza: Postponed to flu season - Pneumovax: Up to date - Prevnar: Not applicable - COVID: Up to date - HPV: Not applicable - Shingrix vaccine: Refused  SCREENING: - Colonoscopy: Up to date  Discussed with patient purpose of the colonoscopy is to detect colon cancer at curable precancerous or early stages   PATIENT COUNSELING:    Sexuality: Discussed sexually transmitted diseases, partner selection, use of condoms, avoidance of unintended pregnancy  and contraceptive alternatives.   Advised to avoid cigarette smoking.  I discussed with the patient that most people either abstain from alcohol or drink within safe  limits (<=14/week and <=4 drinks/occasion for males, <=7/weeks and <= 3 drinks/occasion for females) and  that the risk for alcohol disorders and other health effects rises proportionally with the number of drinks per week and how often a drinker exceeds daily limits.  Discussed cessation/primary prevention of drug use and availability of treatment for abuse.   Diet: Encouraged to adjust caloric intake to maintain  or achieve ideal body weight, to reduce intake of dietary saturated fat and total fat, to limit sodium intake by avoiding high sodium foods and not adding table salt, and to maintain adequate dietary potassium and calcium preferably from fresh fruits, vegetables, and low-fat dairy products.    stressed the importance of regular exercise  Injury prevention: Discussed safety belts, safety helmets, smoke detector, smoking near bedding or upholstery.   Dental health: Discussed importance of regular tooth brushing, flossing, and dental visits.   Follow up plan: NEXT PREVENTATIVE PHYSICAL DUE IN 1 YEAR. Return in about 3 months (around 12/08/2021).

## 2021-09-08 LAB — CBC WITH DIFFERENTIAL/PLATELET
Basophils Absolute: 0.1 10*3/uL (ref 0.0–0.2)
Basos: 1 %
EOS (ABSOLUTE): 0.1 10*3/uL (ref 0.0–0.4)
Eos: 2 %
Hematocrit: 42 % (ref 37.5–51.0)
Hemoglobin: 13.8 g/dL (ref 13.0–17.7)
Immature Grans (Abs): 0 10*3/uL (ref 0.0–0.1)
Immature Granulocytes: 1 %
Lymphocytes Absolute: 1.6 10*3/uL (ref 0.7–3.1)
Lymphs: 27 %
MCH: 29.7 pg (ref 26.6–33.0)
MCHC: 32.9 g/dL (ref 31.5–35.7)
MCV: 91 fL (ref 79–97)
Monocytes Absolute: 0.4 10*3/uL (ref 0.1–0.9)
Monocytes: 7 %
Neutrophils Absolute: 3.7 10*3/uL (ref 1.4–7.0)
Neutrophils: 62 %
Platelets: 169 10*3/uL (ref 150–450)
RBC: 4.64 x10E6/uL (ref 4.14–5.80)
RDW: 13.1 % (ref 11.6–15.4)
WBC: 6 10*3/uL (ref 3.4–10.8)

## 2021-09-08 LAB — COMPREHENSIVE METABOLIC PANEL
ALT: 53 IU/L — ABNORMAL HIGH (ref 0–44)
AST: 41 IU/L — ABNORMAL HIGH (ref 0–40)
Albumin/Globulin Ratio: 1.7 (ref 1.2–2.2)
Albumin: 4.6 g/dL (ref 3.8–4.9)
Alkaline Phosphatase: 167 IU/L — ABNORMAL HIGH (ref 44–121)
BUN/Creatinine Ratio: 9 (ref 9–20)
BUN: 8 mg/dL (ref 6–24)
Bilirubin Total: 0.3 mg/dL (ref 0.0–1.2)
CO2: 24 mmol/L (ref 20–29)
Calcium: 9.6 mg/dL (ref 8.7–10.2)
Chloride: 101 mmol/L (ref 96–106)
Creatinine, Ser: 0.87 mg/dL (ref 0.76–1.27)
Globulin, Total: 2.7 g/dL (ref 1.5–4.5)
Glucose: 204 mg/dL — ABNORMAL HIGH (ref 70–99)
Potassium: 3.9 mmol/L (ref 3.5–5.2)
Sodium: 140 mmol/L (ref 134–144)
Total Protein: 7.3 g/dL (ref 6.0–8.5)
eGFR: 99 mL/min/{1.73_m2} (ref >59)

## 2021-09-08 LAB — TSH: TSH: 1.71 u[IU]/mL (ref 0.450–4.500)

## 2021-09-08 LAB — LIPID PANEL W/O CHOL/HDL RATIO
Cholesterol, Total: 137 mg/dL (ref 100–199)
HDL: 34 mg/dL — ABNORMAL LOW (ref >39)
LDL Chol Calc (NIH): 60 mg/dL (ref 0–99)
Triglycerides: 268 mg/dL — ABNORMAL HIGH (ref 0–149)
VLDL Cholesterol Cal: 43 mg/dL — ABNORMAL HIGH (ref 5–40)

## 2021-09-08 LAB — PSA: Prostate Specific Ag, Serum: 0.5 ng/mL (ref 0.0–4.0)

## 2021-09-14 ENCOUNTER — Ambulatory Visit (INDEPENDENT_AMBULATORY_CARE_PROVIDER_SITE_OTHER): Payer: Medicare Other

## 2021-09-14 ENCOUNTER — Telehealth: Payer: Medicare Other

## 2021-09-14 DIAGNOSIS — I10 Essential (primary) hypertension: Secondary | ICD-10-CM

## 2021-09-14 DIAGNOSIS — J439 Emphysema, unspecified: Secondary | ICD-10-CM

## 2021-09-14 DIAGNOSIS — C3490 Malignant neoplasm of unspecified part of unspecified bronchus or lung: Secondary | ICD-10-CM

## 2021-09-14 DIAGNOSIS — E1122 Type 2 diabetes mellitus with diabetic chronic kidney disease: Secondary | ICD-10-CM

## 2021-09-14 NOTE — Chronic Care Management (AMB) (Signed)
Chronic Care Management   CCM RN Visit Note  09/14/2021 Name: Kristopher [name] MRN: [id number] DOB: 07-02-1960  Subjective: Kristopher [name] is a 61 y.o. year old male who is a primary care patient of [name], DO. The care management team was consulted for assistance with disease management and care coordination needs.    Engaged with patient by telephone for follow up visit in response to provider referral for case management and/or care coordination services. Case Closure. Patient has met the goals of care and the care plan is being closed.  Consent to Services:  The patient was given information about Chronic Care Management services, agreed to services, and gave verbal consent prior to initiation of services.  Please see initial visit note for detailed documentation.   Patient agreed to services and verbal consent obtained.   Assessment: Review of patient past medical history, allergies, medications, health status, including review of consultants reports, laboratory and other test data, was performed as part of comprehensive evaluation and provision of chronic care management services.   SDOH (Social Determinants of Health) assessments and interventions performed:    CCM Care Plan  Allergies  Allergen Reactions   Acetaminophen Swelling   Aspirin Anaphylaxis   Epinephrine Anaphylaxis    Does not include albuterol   Novocain [Procaine] Anaphylaxis   Penicillins Anaphylaxis    Has patient had a PCN reaction causing immediate rash, facial/tongue/throat swelling, SOB or lightheadedness with hypotension: Yes Has patient had a PCN reaction causing severe rash involving mucus membranes or skin necrosis: No Has patient had a PCN reaction that required hospitalization: Yes Has patient had a PCN reaction occurring within the last 10 years: No If all of the above answers are "NO", then may proceed with Cephalosporin use.    Strawberry Extract Anaphylaxis   Shellfish Allergy  Hives and Nausea And Vomiting    Outpatient Encounter Medications as of 09/14/2021  Medication Sig Note   Accu-Chek FastClix Lancets MISC USE TO TEST BLOOD SUGAR 2X A DAY    albuterol (PROVENTIL) (2.5 MG/3ML) 0.083% nebulizer solution Take 3 mLs (2.5 mg total) by nebulization every 6 (six) hours as needed for wheezing or shortness of breath.    albuterol (VENTOLIN HFA) 108 (90 Base) MCG/ACT inhaler Inhale into the lungs.    amLODipine (NORVASC) 5 MG tablet TAKE 1 TABLET(5 MG) BY MOUTH TWICE DAILY    atorvastatin (LIPITOR) 10 MG tablet Take 1 tablet (10 mg total) by mouth daily at 6 PM.    BREZTRI AEROSPHERE 160-9-4.8 MCG/ACT AERO Inhale 2 puffs into the lungs 2 (two) times daily. Maintenance per pulmonology    diphenhydrAMINE (BENADRYL) 25 MG tablet Take 50 mg by mouth daily.    empagliflozin (JARDIANCE) 25 MG TABS tablet TAKE 1 TABLET(25 MG) BY MOUTH DAILY BEFORE BREAKFAST    esomeprazole (NEXIUM) 40 MG capsule TAKE 1 CAPSULE(40 MG) BY MOUTH DAILY    glucose blood (ACCU-CHEK GUIDE) test strip USE TO TEST BLOOD SUGAR 2X A DAY    hydrALAZINE (APRESOLINE) 100 MG tablet Take 1 tablet (100 mg total) by mouth 2 (two) times daily.    lidocaine (LIDODERM) 5 % Place 1 patch onto the skin daily. Remove & Discard patch within 12 hours or as directed by MD    metFORMIN (GLUCOPHAGE) 500 MG tablet Take 2 tablets (1,000 mg total) by mouth 2 (two) times daily with a meal.    metoprolol succinate (TOPROL-XL) 100 MG 24 hr tablet TAKE 1 TABLET BY MOUTH TWICE DAILY WITH  OR IMMEDIATELY FOLLOWING A MEAL; TO BE TAKEN WITH 50 MG METOPROLOL TARTRATE    metoprolol tartrate (LOPRESSOR) 50 MG tablet TAKE 1 TABLET BY MOUTH TWICE DAILY. TO BE TAKEN WITH 100 MG METOPROLOL SUCCINATE    montelukast (SINGULAIR) 10 MG tablet Take 1 tablet (10 mg total) by mouth daily.    morphine (MSIR) 15 MG tablet Take 1 tablet (15 mg total) by mouth every 6 (six) hours as needed for moderate pain or severe pain. Must last 30 days.    [START  ON 09/16/2021] morphine (MSIR) 15 MG tablet Take 1 tablet (15 mg total) by mouth every 6 (six) hours as needed for moderate pain or severe pain. Must last 30 days.    [START ON 10/16/2021] morphine (MSIR) 15 MG tablet Take 1 tablet (15 mg total) by mouth every 6 (six) hours as needed for moderate pain or severe pain. Must last 30 days. 08/13/2021: WARNING: Not a Duplicate. Future prescription. DO NOT DELETE during hospital medication reconciliation or at discharge. [facility] Chronic [facility] Management Patient    Multiple Vitamin (MULTIVITAMIN WITH MINERALS) TABS tablet Take 1 tablet by mouth daily.    NONFORMULARY OR COMPOUNDED ITEM Apply 1-2 mLs topically 4 (four) times daily as needed. 10% Ketamine/2% Cyclobenzaprine/6% Gabapentin Cream 08/13/2021: WARNING: Not a Duplicate. Future prescription. DO NOT DELETE during hospital medication reconciliation or at discharge. [facility] Management Patient    nortriptyline (PAMELOR) 25 MG capsule Take 2 capsules (50 mg total) by mouth at bedtime.    Potassium Chloride ER 20 MEQ TBCR Take 1 tablet by mouth daily.    STIOLTO RESPIMAT 2.5-2.5 MCG/ACT AERS Inhale 2 puffs into the lungs 4 (four) times daily as needed. Prn per pulmonology notes    SUMAtriptan (IMITREX) 50 MG tablet Take 1 tablet (50 mg total) by mouth every 2 (two) hours as needed for migraine. Take 1 tab at onset of migraine. May repeat in 2 hours if headache persists or recurs.    Facility-Administered Encounter Medications as of 09/14/2021  Medication   0.9 %  sodium chloride infusion   sodium chloride flush (NS) 0.9 % injection 10 mL    Patient Active Problem List   Diagnosis Date Noted   Port-A-Cath in place 07/12/2021   Chronic use of opiate for therapeutic purpose 08/20/2020   Malignant neoplasm of lung ([place])    Goals of care, counseling/discussion 11/01/2019   Pulmonary emphysema ([place]) 08/25/2019   OSA (obstructive sleep apnea) 06/16/2019   History of colonic polyps 02/02/2019    Shortness of breath 02/02/2019   Chronic upper extremity pain (3ry area of Pain) (Bilateral) (L>R) 01/19/2019   Squamous cell carcinoma of left lung ([place]) 01/16/2019   Lung mass 01/14/2019   Left lower lobe pulmonary nodule 10/26/2018   History of allergy to ester type local anesthetic (procaine) 10/21/2018    Class: History of   Hyperlipidemia associated with type 2 diabetes mellitus ([place]) 07/20/2018   Type 2 diabetes mellitus with stage 1 chronic kidney disease, without long-term current use of insulin ([place]) 12/30/2017   S/P hip replacement 12/02/2017   Hip arthritis 10/01/2017   Aortic atherosclerosis ([place]) 08/25/2017   Left-sided weakness 08/13/2017   Musculoskeletal pain, chronic 04/03/2017   History of tobacco abuse 06/24/2016   GERD (gastroesophageal reflux disease) 06/24/2016   Tobacco abuse 06/24/2016   Pharmacologic therapy    Benign neoplasm of ascending colon    Polyp of sigmoid colon    Rectal polyp    Benign hypertensive renal disease  01/22/2016   Migraine 01/22/2016   Chronic pain syndrome 01/11/2016   Chronic sacroiliac joint pain (Bilateral) (L>R) 05/31/2015   Chronic hip pain (Left) 05/31/2015   Lumbar facet syndrome (Bilateral) (L>R) 05/31/2015   Chronic lower extremity pain (2ry area of Pain) (Left) 05/31/2015   Greater occipital neuralgia (Right) 05/31/2015   Retrolisthesis of L5-S1 02/06/2015   Cervical disc herniation (C4-5 and C5-6) 02/06/2015   Lumbar disc herniation (L5-S1) 02/06/2015   Hypokalemia 02/01/2015   Cervical spinal stenosis (C4-5) 01/06/2015   Cervical foraminal stenosis (Bilateral C5-6) 01/06/2015   Chronic low back pain (1ry area of Pain) (Bilateral) (L>R) 01/05/2015   Lumbar spondylosis 01/05/2015   Chronic lumbar radicular pain (S1 dermatomal) (Left) 01/05/2015   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) 01/05/2015   Chronic neck pain (posterior midline) (Bilateral) (L>R) 01/05/2015   Cervical spondylosis  01/05/2015   Chronic cervical radicular pain (Bilateral) (C5/C6 dermatome) (L>R) 01/05/2015   Long term current use of opiate analgesic 01/05/2015   Long term prescription opiate use 01/05/2015   Opiate use (60 MME/Day) 17/00/1749   Uncomplicated opioid dependence ([place]) 01/05/2015    Conditions to be addressed/monitored:HTN, COPD, DMII, and Lung cancer   Care Plan : RNCM: General Plan of Care (Adult) for Chronic Disease Management and Care Coordination Needs  Updates made by Vanita Ingles, RN since 09/14/2021 12:00 AM  Completed 09/14/2021   Problem: RNCM: Development of Plan of Care for Chronic Disease Management (DM, HTN, Lung Cancer) Resolved 09/14/2021  Priority: High     Long-Range Goal: RNCM: Effective Management  of Plan of Care for Chronic Disease Management (DM, HTN, Lung Cancer) Completed 09/14/2021  Start Date: 01/12/2021  Expected End Date: 01/12/2022  Priority: High  Note:   Current Barriers: 09-14-2021: Goals met and care plan is being closed  Knowledge Deficits related to plan of care for management of HTN, DMII, and Lung cancer  Chronic Disease Management support and education needs related to HTN, DMII, and Lung cancer   RNCM Clinical Goal(s):  Patient will verbalize understanding of plan for management of HTN, DMII, and lung cancer  as evidenced by keeping appointments, following the plan of care, working with the CCM team to effectively manage health and well being  demonstrate understanding of rationale for each prescribed medication as evidenced by compliance with medications and calling for refills before running out of medications     attend all scheduled medical appointments: see pcp and specialist on a regular basis, as evidenced by keeping appointments and calling the office if appointment needs to be rescheduled         demonstrate improved and ongoing adherence to prescribed treatment plan for HTN, DMII, and lung cancer  as evidenced by effective management of  chronic conditions by working with the CCM team to optimize health and well being  demonstrate a decrease in HTN, DMII, and Lung cancer  exacerbations  as evidenced by effective management of chronic conditions  demonstrate ongoing self health care management ability for effective management of chronic conditions  as evidenced by working with the CCM team through collaboration with Consulting civil engineer, provider, and care team.   Interventions: 1:1 collaboration with primary care provider regarding development and update of comprehensive plan of care as evidenced by provider attestation and co-signature Inter-disciplinary care team collaboration (see longitudinal plan of care) Evaluation of current treatment plan related to  self management and patient's adherence to plan as established by provider   SDOH Barriers (Status: Goal  Met.) Long Term Goal 09-14-2021: Goals met and care plan is being closed  Patient interviewed and SDOH assessment performed        Patient interviewed and appropriate assessments performed Provided patient with information about resources available and care guides to assist with new needs or changes in SDOH Discussed plans with patient for ongoing care management follow up and provided patient with direct contact information for care management team Advised patient to call the office for changes in SDOH, questions or concerns    Diabetes:  (Status: Goal Met.) Long Term Goal 09-14-2021: Goals met and care plan is being closed   Lab Results  Component Value Date   HGBA1C 6.8 (H) 03/08/2021  Slight increase from last A1C.  Assessed patient's understanding of A1c goal: <7%. 09-14-2021: The patient knows the goal of A1c is <7.0. The patient with elevation of A1C of 7.9 on 09-07-2021> The patient has been eating a lot of sweets. Has since cut back on eating sweets and denies any new issues or concerns. Education and support given.  Provided education to patient about basic DM  disease process; Reviewed medications with patient and discussed importance of medication adherence. 05-11-2021: The patient is compliant with medications. The patient denies any issues with DM medications. The patient states that he does need a refill for his jardiance 25 mg QD. Will send an in basket message requesting a refill for his jardiance. 09-14-2021: The patient is compliant with medications. Denies any medication needs.    Reviewed prescribed diet with patient heart healthy/ADA diet. 03-16-2021: The patient is compliant with heart healthy/ADA diet. The patient recently was on vacation in [place] and got back last week. He states he was eating some different things while away and his blood sugars went up slightly then. 09-14-2021: Review of heart healthy/ADA diet. Review of monitoring the sweets and sugary items he is eating. Education and support given; Counseled on importance of regular laboratory monitoring as prescribed. 09-14-2021: Review of regular lab work to check levels        Discussed plans with patient for ongoing care management follow up and provided patient with direct contact information for care management team;      Provided patient with written educational materials related to hypo and hyperglycemia and importance of correct treatment. 03-16-2021: Denies any lows a this time. Did have a blood sugar up in 200's while on vacation but states it is stable now that he is at home. 09-14-2021: Denies any highs or lows. States that his ranges have been normal;       Reviewed scheduled/upcoming provider appointments including:  saw the pcp again on 09-07-2021, sees on a regular basis Advised patient, providing education and rationale, to check cbg as directed  and record. 03-16-2021: The patient states that his blood sugar average has been 130 to 140. Did have one last week around 200. Denies any acute findings. 09-14-2021: States his range has been WNL. Denies any acute findings at this time. Will  continue to monitor for changes.         call provider for findings outside established parameters;       Review of patient status, including review of consultants reports, relevant laboratory and other test results, and medications completed;       Screening for signs and symptoms of depression related to chronic disease state;        Assessed social determinant of health barriers;         Hypertension: (Status: Goal  Met.)09-14-2021: Goals met and care plan is being closed  Last practice recorded BP readings:  BP Readings from Last 3 Encounters:  09/07/21 120/75  08/13/21 117/83  07/19/21 136/89  Most recent eGFR/CrCl:  Lab Results  Component Value Date   EGFR 99 09/07/2021    No components found for: CRCL  Evaluation of current treatment plan related to hypertension self management and patient's adherence to plan as established by provider. 09-14-2021: The patient is doing well and denies any issues with his HTN management or heart health;   Provided education to patient re: stroke prevention, s/s of heart attack and stroke; Reviewed prescribed diet heart healthy/ADA Reviewed medications with patient and discussed importance of compliance. 09-14-2021: Is compliant with his medications;  Discussed plans with patient for ongoing care management follow up and provided patient with direct contact information for care management team; Advised patient, providing education and rationale, to monitor blood pressure daily and record, calling PCP for findings outside established parameters;  Provided education on prescribed diet heart healthy/ADA;  Discussed complications of poorly controlled blood pressure such as heart disease, stroke, circulatory complications, vision complications, kidney impairment, sexual dysfunction;    Lung cancer   (Status: Goal Met.) Long Term Goal 09-14-2021: The patient had his port a cath removed in June. He follows up regularly with the oncologist. Condition is stable.  Goals met and care plan is being closed  Evaluation of current treatment plan related to  Lung cancer  ,  self-management and patient's adherence to plan as established by provider. 03-16-2021:  The patient is currently not taking any treatments. He goes back in February for new scans and evaluation. At that point they will determine a course of action if any. 05-11-2021: The patient goes back next week to see the oncologist. Currently is not having any treatments and states his conditions is stable. The patient states he feels great and is doing well. Will know more after his scans if he has to take additional treatments or what the plan of care will be moving forward. 07-06-2021: The patient saw oncologist yesterday and he is stable. He is going to have to have his port removed next week because it will not flush well or return blood. The patient states that the provider states if he needs to have another one they will insert a new one. He will have a scan again in 4 months and then they will go from there. The patient states he feels good and denies any acute distress.  Discussed plans with patient for ongoing care management follow up and provided patient with direct contact information for care management team Advised patient to keep appointments, calling the office for changes in conditions, or sx and sx of adverse reactions to chemotherapy treatment for lung cancer ; Provided education to patient re: sx and sx to monitor for changes, reporting increased fatigue, nausea, or pain, maintaining nutrition and utilization of support system ; Reviewed medications with patient and discussed compliance ; Reviewed scheduled/upcoming provider appointments including February 2023 for repeat scan and appointment with oncologist; Discussed plans with patient for ongoing care management follow up and provided patient with direct contact information for care management team; Advised patient to discuss changes in disease  progression with provider;    Skin tag/skin lesion to chest and back   (Status: Goal Met.) Short Term Goal 03-16-2021: The patient saw Dr. Wynetta Emery on 03-12-2021 and had the skin tags removed. The patient is doing well and denies any acute  distress. Said the felt better as soon as she finished and she did a good job. Denies any concerns or issues related to skin tag/lesion. Evaluation of current treatment plan related to  acute onset of skin tag or lesion to back and chest ,  self-management and patient's adherence to plan as established by provider. Discussed plans with patient for ongoing care management follow up and provided patient with direct contact information for care management team Advised patient to call the office and secure an appointment for follow up on skin tag or lession to mid back and chest. The patient states it is an area that is sore and the cancer center states that it is something the pcp needs to evaluate; Provided education to patient re: sx and sx of infection, calling the office for changes, and monitoring for changes in the 2 areas of concern; Collaborated with pcp regarding new onset of skin tags/lesion to back and chest; Reviewed scheduled/upcoming provider appointments including 03-06-2021, advised the patient he may need a sooner appointment to call and set up and appointment to see the pcp; Discussed plans with patient for ongoing care management follow up and provided patient with direct contact information for care management team;    COPD: (Status: Goal Met.) Long Term Goal 09-14-2021: Goals met and care plan is being closed  Reviewed medications with patient, including use of prescribed maintenance and rescue inhalers, and provided instruction on medication management and the importance of adherence. 05-11-2021: The patient states that the nebulizer treatments he has been having have been effective in the management of his COPD exacerbation. He says the treatments each  morning really open him up. He says he only has a small amount of wheezing right now. States he feels so much better. 07-06-2021: The patient states he is using his nebulizer every other morning and feels it is an exacerbation of his allergies. The patient states that for the most part he feels great but is noticing in the mornings he is having to use the nebulizer at times.  Provided patient with basic written and verbal COPD education on self care/management/and exacerbation prevention Advised patient to track and manage COPD triggers. 07-06-2021: Review of factors that cause exacerbation or changes that could cause the patient to have triggers in his condition. Did discuss upcoming weather changes.  Provided written and verbal instructions on pursed lip breathing and utilized returned demonstration as teach back Provided instruction about proper use of medications used for management of COPD including inhalers Advised patient to self assesses COPD action plan zone and make appointment with provider if in the yellow zone for 48 hours without improvement Advised patient to engage in light exercise as tolerated 3-5 days a week to aid in the the management of COPD Provided education about and advised patient to utilize infection prevention strategies to reduce risk of respiratory infection Discussed the importance of adequate rest and management of fatigue with COPD. 07-06-2021: Review and education given. Will continue to monitor.    Patient Goals/Self-Care Activities: Patient will self administer medications as prescribed as evidenced by self report/primary caregiver report  Patient will attend all scheduled provider appointments as evidenced by clinician review of documented attendance to scheduled appointments and patient/caregiver report Patient will call pharmacy for medication refills as evidenced by patient report and review of pharmacy fill history as appropriate Patient will attend church or other  social activities as evidenced by patient report Patient will continue to perform ADL's independently as evidenced by patient/caregiver report  Patient will continue to perform IADL's independently as evidenced by patient/caregiver report Patient will call provider office for new concerns or questions as evidenced by review of documented incoming telephone call notes and patient report Patient will work with BSW to address care coordination needs and will continue to work with the clinical team to address health care and disease management related needs as evidenced by documented adherence to scheduled care management/care coordination appointments schedule appointment with eye doctor check blood sugar at prescribed times: when you have symptoms of low or high blood sugar, before and after exercise, and as directed   check feet daily for cuts, sores or redness enter blood sugar readings and medication or insulin into daily log take the blood sugar log to all doctor visits trim toenails straight across drink 6 to 8 glasses of water each day eat fish at least once per week fill half of plate with vegetables limit fast food meals to no more than 1 per week manage portion size prepare main meal at home 3 to 5 days each week read food labels for fat, fiber, carbohydrates and portion size reduce red meat to 2 to 3 times a week set a realistic goal keep feet up while sitting wash and dry feet carefully every day wear comfortable, cotton socks wear comfortable, well-fitting shoes - check blood pressure weekly - choose a place to take my blood pressure (home, clinic or office, retail store) - learn about high blood pressure - keep a blood pressure log - take blood pressure log to all doctor appointments - call doctor for signs and symptoms of high blood pressure - develop an action plan for high blood pressure - keep all doctor appointments - take medications for blood pressure exactly as  prescribed - report new symptoms to your doctor - eat more whole grains, fruits and vegetables, lean meats and healthy fats       Plan:No further follow up required: the patient has met the goals of care and the care plan is being closed. The patient knows to call the [facility] for new questions or concerns.   Noreene Larsson RN, MSN, [facility] Mobile: [phone]

## 2021-09-14 NOTE — Patient Instructions (Signed)
Visit Information  Thank you for taking time to visit with me today. Please don't hesitate to contact me if I can be of assistance to you before our next scheduled telephone appointment.  Following are the goals we discussed today:  Current Barriers: 09-14-2021: Goals met and care plan is being closed  Knowledge Deficits related to plan of care for management of HTN, DMII, and Lung cancer  Chronic Disease Management support and education needs related to HTN, DMII, and Lung cancer    RNCM Clinical Goal(s):  Patient will verbalize understanding of plan for management of HTN, DMII, and lung cancer  as evidenced by keeping appointments, following the plan of care, working with the CCM team to effectively manage health and well being  demonstrate understanding of rationale for each prescribed medication as evidenced by compliance with medications and calling for refills before running out of medications     attend all scheduled medical appointments: see pcp and specialist on a regular basis, as evidenced by keeping appointments and calling the office if appointment needs to be rescheduled         demonstrate improved and ongoing adherence to prescribed treatment plan for HTN, DMII, and lung cancer  as evidenced by effective management of chronic conditions by working with the CCM team to optimize health and well being  demonstrate a decrease in HTN, DMII, and Lung cancer  exacerbations  as evidenced by effective management of chronic conditions  demonstrate ongoing self health care management ability for effective management of chronic conditions  as evidenced by working with the CCM team through collaboration with Consulting civil engineer, provider, and care team.    Interventions: 1:1 collaboration with primary care provider regarding development and update of comprehensive plan of care as evidenced by provider attestation and co-signature Inter-disciplinary care team collaboration (see longitudinal plan of  care) Evaluation of current treatment plan related to  self management and patient's adherence to plan as established by provider     SDOH Barriers (Status: Goal Met.) Long Term Goal 09-14-2021: Goals met and care plan is being closed  Patient interviewed and SDOH assessment performed        Patient interviewed and appropriate assessments performed Provided patient with information about resources available and care guides to assist with new needs or changes in SDOH Discussed plans with patient for ongoing care management follow up and provided patient with direct contact information for care management team Advised patient to call the office for changes in SDOH, questions or concerns       Diabetes:  (Status: Goal Met.) Long Term Goal 09-14-2021: Goals met and care plan is being closed         Lab Results  Component Value Date    HGBA1C 6.8 (H) 03/08/2021  Slight increase from last A1C.  Assessed patient's understanding of A1c goal: <7%. 09-14-2021: The patient knows the goal of A1c is <7.0. The patient with elevation of A1C of 7.9 on 09-07-2021> The patient has been eating a lot of sweets. Has since cut back on eating sweets and denies any new issues or concerns. Education and support given.  Provided education to patient about basic DM disease process; Reviewed medications with patient and discussed importance of medication adherence. 05-11-2021: The patient is compliant with medications. The patient denies any issues with DM medications. The patient states that he does need a refill for his jardiance 25 mg QD. Will send an in basket message requesting a refill for his jardiance. 09-14-2021: The  patient is compliant with medications. Denies any medication needs.    Reviewed prescribed diet with patient heart healthy/ADA diet. 03-16-2021: The patient is compliant with heart healthy/ADA diet. The patient recently was on vacation in [place] and got back last week. He states he was eating some  different things while away and his blood sugars went up slightly then. 09-14-2021: Review of heart healthy/ADA diet. Review of monitoring the sweets and sugary items he is eating. Education and support given; Counseled on importance of regular laboratory monitoring as prescribed. 09-14-2021: Review of regular lab work to check levels        Discussed plans with patient for ongoing care management follow up and provided patient with direct contact information for care management team;      Provided patient with written educational materials related to hypo and hyperglycemia and importance of correct treatment. 03-16-2021: Denies any lows a this time. Did have a blood sugar up in 200's while on vacation but states it is stable now that he is at home. 09-14-2021: Denies any highs or lows. States that his ranges have been normal;       Reviewed scheduled/upcoming provider appointments including:  saw the pcp again on 09-07-2021, sees on a regular basis Advised patient, providing education and rationale, to check cbg as directed  and record. 03-16-2021: The patient states that his blood sugar average has been 130 to 140. Did have one last week around 200. Denies any acute findings. 09-14-2021: States his range has been WNL. Denies any acute findings at this time. Will continue to monitor for changes.         call provider for findings outside established parameters;       Review of patient status, including review of consultants reports, relevant laboratory and other test results, and medications completed;       Screening for signs and symptoms of depression related to chronic disease state;        Assessed social determinant of health barriers;          Hypertension: (Status: Goal Met.)09-14-2021: Goals met and care plan is being closed  Last practice recorded BP readings:     BP Readings from Last 3 Encounters:  09/07/21 120/75  08/13/21 117/83  07/19/21 136/89  Most recent eGFR/CrCl:       Lab Results   Component Value Date    EGFR 99 09/07/2021    No components found for: CRCL   Evaluation of current treatment plan related to hypertension self management and patient's adherence to plan as established by provider. 09-14-2021: The patient is doing well and denies any issues with his HTN management or heart health;   Provided education to patient re: stroke prevention, s/s of heart attack and stroke; Reviewed prescribed diet heart healthy/ADA Reviewed medications with patient and discussed importance of compliance. 09-14-2021: Is compliant with his medications;  Discussed plans with patient for ongoing care management follow up and provided patient with direct contact information for care management team; Advised patient, providing education and rationale, to monitor blood pressure daily and record, calling PCP for findings outside established parameters;  Provided education on prescribed diet heart healthy/ADA;  Discussed complications of poorly controlled blood pressure such as heart disease, stroke, circulatory complications, vision complications, kidney impairment, sexual dysfunction;      Lung cancer   (Status: Goal Met.) Long Term Goal 09-14-2021: The patient had his port a cath removed in June. He follows up regularly with the oncologist. Condition is stable.  Goals met and care plan is being closed  Evaluation of current treatment plan related to  Lung cancer  ,  self-management and patient's adherence to plan as established by provider. 03-16-2021:  The patient is currently not taking any treatments. He goes back in February for new scans and evaluation. At that point they will determine a course of action if any. 05-11-2021: The patient goes back next week to see the oncologist. Currently is not having any treatments and states his conditions is stable. The patient states he feels great and is doing well. Will know more after his scans if he has to take additional treatments or what the plan of  care will be moving forward. 07-06-2021: The patient saw oncologist yesterday and he is stable. He is going to have to have his port removed next week because it will not flush well or return blood. The patient states that the provider states if he needs to have another one they will insert a new one. He will have a scan again in 4 months and then they will go from there. The patient states he feels good and denies any acute distress.  Discussed plans with patient for ongoing care management follow up and provided patient with direct contact information for care management team Advised patient to keep appointments, calling the office for changes in conditions, or sx and sx of adverse reactions to chemotherapy treatment for lung cancer ; Provided education to patient re: sx and sx to monitor for changes, reporting increased fatigue, nausea, or pain, maintaining nutrition and utilization of support system ; Reviewed medications with patient and discussed compliance ; Reviewed scheduled/upcoming provider appointments including February 2023 for repeat scan and appointment with oncologist; Discussed plans with patient for ongoing care management follow up and provided patient with direct contact information for care management team; Advised patient to discuss changes in disease progression with provider;      Skin tag/skin lesion to chest and back   (Status: Goal Met.) Short Term Goal 03-16-2021: The patient saw Dr. Wynetta Emery on 03-12-2021 and had the skin tags removed. The patient is doing well and denies any acute distress. Said the felt better as soon as she finished and she did a good job. Denies any concerns or issues related to skin tag/lesion. Evaluation of current treatment plan related to  acute onset of skin tag or lesion to back and chest ,  self-management and patient's adherence to plan as established by provider. Discussed plans with patient for ongoing care management follow up and provided patient  with direct contact information for care management team Advised patient to call the office and secure an appointment for follow up on skin tag or lession to mid back and chest. The patient states it is an area that is sore and the cancer center states that it is something the pcp needs to evaluate; Provided education to patient re: sx and sx of infection, calling the office for changes, and monitoring for changes in the 2 areas of concern; Collaborated with pcp regarding new onset of skin tags/lesion to back and chest; Reviewed scheduled/upcoming provider appointments including 03-06-2021, advised the patient he may need a sooner appointment to call and set up and appointment to see the pcp; Discussed plans with patient for ongoing care management follow up and provided patient with direct contact information for care management team;      COPD: (Status: Goal Met.) Long Term Goal 09-14-2021: Goals met and care plan is being closed  Reviewed medications with patient, including use of prescribed maintenance and rescue inhalers, and provided instruction on medication management and the importance of adherence. 05-11-2021: The patient states that the nebulizer treatments he has been having have been effective in the management of his COPD exacerbation. He says the treatments each morning really open him up. He says he only has a small amount of wheezing right now. States he feels so much better. 07-06-2021: The patient states he is using his nebulizer every other morning and feels it is an exacerbation of his allergies. The patient states that for the most part he feels great but is noticing in the mornings he is having to use the nebulizer at times.  Provided patient with basic written and verbal COPD education on self care/management/and exacerbation prevention Advised patient to track and manage COPD triggers. 07-06-2021: Review of factors that cause exacerbation or changes that could cause the patient to have  triggers in his condition. Did discuss upcoming weather changes.  Provided written and verbal instructions on pursed lip breathing and utilized returned demonstration as teach back Provided instruction about proper use of medications used for management of COPD including inhalers Advised patient to self assesses COPD action plan zone and make appointment with provider if in the yellow zone for 48 hours without improvement Advised patient to engage in light exercise as tolerated 3-5 days a week to aid in the the management of COPD Provided education about and advised patient to utilize infection prevention strategies to reduce risk of respiratory infection Discussed the importance of adequate rest and management of fatigue with COPD. 07-06-2021: Review and education given. Will continue to monitor.     Patient Goals/Self-Care Activities: Patient will self administer medications as prescribed as evidenced by self report/primary caregiver report  Patient will attend all scheduled provider appointments as evidenced by clinician review of documented attendance to scheduled appointments and patient/caregiver report Patient will call pharmacy for medication refills as evidenced by patient report and review of pharmacy fill history as appropriate Patient will attend church or other social activities as evidenced by patient report Patient will continue to perform ADL's independently as evidenced by patient/caregiver report Patient will continue to perform IADL's independently as evidenced by patient/caregiver report Patient will call provider office for new concerns or questions as evidenced by review of documented incoming telephone call notes and patient report Patient will work with BSW to address care coordination needs and will continue to work with the clinical team to address health care and disease management related needs as evidenced by documented adherence to scheduled care management/care  coordination appointments schedule appointment with eye doctor check blood sugar at prescribed times: when you have symptoms of low or high blood sugar, before and after exercise, and as directed   check feet daily for cuts, sores or redness enter blood sugar readings and medication or insulin into daily log take the blood sugar log to all doctor visits trim toenails straight across drink 6 to 8 glasses of water each day eat fish at least once per week fill half of plate with vegetables limit fast food meals to no more than 1 per week manage portion size prepare main meal at home 3 to 5 days each week read food labels for fat, fiber, carbohydrates and portion size reduce red meat to 2 to 3 times a week set a realistic goal keep feet up while sitting wash and dry feet carefully every day wear comfortable, cotton socks wear comfortable, well-fitting shoes -  check blood pressure weekly - choose a place to take my blood pressure (home, clinic or office, retail store) - learn about high blood pressure - keep a blood pressure log - take blood pressure log to all doctor appointments - call doctor for signs and symptoms of high blood pressure - develop an action plan for high blood pressure - keep all doctor appointments - take medications for blood pressure exactly as prescribed - report new symptoms to your doctor - eat more whole grains, fruits and vegetables, lean meats and healthy fats    No further follow up required. The patient has met the goals of care and the care plan has been closed.   Please call the care guide team at [phone] if you need to schedule an appointment.   If you are experiencing a Mental Health or [place] or need someone to talk to, please call the Suicide and Crisis Lifeline: 988 call the [place] National Suicide Prevention Lifeline: (878)386-8859 or TTY: 878-763-9146 TTY (785)192-6184) to talk to a trained counselor call 1-800-273-TALK  (toll free, 24 hour hotline)   Patient verbalizes understanding of instructions and care plan provided today and agrees to view in [facility]. Active MyChart status and patient understanding of how to access instructions and care plan via MyChart confirmed with patient.     Noreene Larsson RN, MSN, [facility] Mobile: [phone]

## 2021-09-20 ENCOUNTER — Telehealth: Payer: Self-pay

## 2021-09-20 NOTE — Chronic Care Management (AMB) (Signed)
Chronic Care Management Pharmacy Assistant   Name: Kristopher [name]  MRN: [id number] DOB: Jun 24, 1960   Reason for Encounter: Disease State Diabetes mellitus   Recent office visits:  09/07/21-Megan Annia Friendly, DO (PCP) General follow up visit. Labs ordered. Follow up in 3 months. 05/08/21-Megan Annia Friendly, DO (PCP) Seen for COPD. Follow up in 4 months. 04/24/21-Megan Annia Friendly, DO (PCP) Seen for wheezing.Start on Prednisone 10 mg and start on Albuterol 0.083% nebulizer solution. Follow up in 2 weeks. 04/10/21-Jolene T. Ned Card, NP (PCP) Seen for upper respiratory infection. Start on Doxycycline 100 mg BID for 7 days and prednisone 40 mg for 5 days. Chest x-ray completed. Follow up in 2 weeks.  Recent consult visits:  08/13/21-Francisco Dossie Arbour, MD (Pain medicine) Seen for chronic pain syndrome. Follow up in 3 months. 07/19/21-Denny Christian Mate, MD (General surgery) Routine post-op. 07/05/21-Timothy J. Grayland Ormond, MD (Oncology) Seen for squamous cell carcinoma of left lung. 05/14/21-Francisco Dossie Arbour, MD (Pain medicine) Chronic paion syndrome. Follow up in 3 months. 04/05/21-Jennifer Ciro Backer, NP (Oncology) Seen for squamous cell carcinoma of left lung. Follow up in 3 months.  Hospital visits:  None in previous 6 months  Medications: Outpatient Encounter Medications as of 09/20/2021  Medication Sig Note   Accu-Chek FastClix Lancets MISC USE TO TEST BLOOD SUGAR 2X A DAY    albuterol (PROVENTIL) (2.5 MG/3ML) 0.083% nebulizer solution Take 3 mLs (2.5 mg total) by nebulization every 6 (six) hours as needed for wheezing or shortness of breath.    albuterol (VENTOLIN HFA) 108 (90 Base) MCG/ACT inhaler Inhale into the lungs.    amLODipine (NORVASC) 5 MG tablet TAKE 1 TABLET(5 MG) BY MOUTH TWICE DAILY    atorvastatin (LIPITOR) 10 MG tablet Take 1 tablet (10 mg total) by mouth daily at 6 PM.    BREZTRI AEROSPHERE 160-9-4.8 MCG/ACT AERO Inhale 2 puffs into the lungs 2 (two) times daily.  Maintenance per pulmonology    diphenhydrAMINE (BENADRYL) 25 MG tablet Take 50 mg by mouth daily.    empagliflozin (JARDIANCE) 25 MG TABS tablet TAKE 1 TABLET(25 MG) BY MOUTH DAILY BEFORE BREAKFAST    esomeprazole (NEXIUM) 40 MG capsule TAKE 1 CAPSULE(40 MG) BY MOUTH DAILY    glucose blood (ACCU-CHEK GUIDE) test strip USE TO TEST BLOOD SUGAR 2X A DAY    hydrALAZINE (APRESOLINE) 100 MG tablet Take 1 tablet (100 mg total) by mouth 2 (two) times daily.    lidocaine (LIDODERM) 5 % Place 1 patch onto the skin daily. Remove & Discard patch within 12 hours or as directed by MD    metFORMIN (GLUCOPHAGE) 500 MG tablet Take 2 tablets (1,000 mg total) by mouth 2 (two) times daily with a meal.    metoprolol succinate (TOPROL-XL) 100 MG 24 hr tablet TAKE 1 TABLET BY MOUTH TWICE DAILY WITH OR IMMEDIATELY FOLLOWING A MEAL; TO BE TAKEN WITH 50 MG METOPROLOL TARTRATE    metoprolol tartrate (LOPRESSOR) 50 MG tablet TAKE 1 TABLET BY MOUTH TWICE DAILY. TO BE TAKEN WITH 100 MG METOPROLOL SUCCINATE    montelukast (SINGULAIR) 10 MG tablet Take 1 tablet (10 mg total) by mouth daily.    morphine (MSIR) 15 MG tablet Take 1 tablet (15 mg total) by mouth every 6 (six) hours as needed for moderate pain or severe pain. Must last 30 days.    morphine (MSIR) 15 MG tablet Take 1 tablet (15 mg total) by mouth every 6 (six) hours as needed for moderate pain or severe pain. Must last 30 days.    [  START ON 10/16/2021] morphine (MSIR) 15 MG tablet Take 1 tablet (15 mg total) by mouth every 6 (six) hours as needed for moderate pain or severe pain. Must last 30 days. 08/13/2021: WARNING: Not a Duplicate. Future prescription. DO NOT DELETE during hospital medication reconciliation or at discharge. [facility] Chronic Pain Management Patient    Multiple Vitamin (MULTIVITAMIN WITH MINERALS) TABS tablet Take 1 tablet by mouth daily.    NONFORMULARY OR COMPOUNDED ITEM Apply 1-2 mLs topically 4 (four) times daily as needed. 10% Ketamine/2%  Cyclobenzaprine/6% Gabapentin Cream 08/13/2021: WARNING: Not a Duplicate. Future prescription. DO NOT DELETE during hospital medication reconciliation or at discharge. [facility] Chronic Pain Management Patient    nortriptyline (PAMELOR) 25 MG capsule Take 2 capsules (50 mg total) by mouth at bedtime.    Potassium Chloride ER 20 MEQ TBCR Take 1 tablet by mouth daily.    STIOLTO RESPIMAT 2.5-2.5 MCG/ACT AERS Inhale 2 puffs into the lungs 4 (four) times daily as needed. Prn per pulmonology notes    SUMAtriptan (IMITREX) 50 MG tablet Take 1 tablet (50 mg total) by mouth every 2 (two) hours as needed for migraine. Take 1 tab at onset of migraine. May repeat in 2 hours if headache persists or recurs.    Facility-Administered Encounter Medications as of 09/20/2021  Medication   0.9 %  sodium chloride infusion   sodium chloride flush (NS) 0.9 % injection 10 mL   Current antihyperglycemic regimen:  Jardiance 25 mg take 1 tablet before breakfast Metformin 500 mg take 2 tablets twice daily  Unsuccessful attempts to complete assessment call. I have called patient 3x and left 3 voicemail's for the patient to return my call when available.   Adherence Review: Is the patient currently on a STATIN medication? Yes Is the patient currently on ACE/ARB medication? No Does the patient have >5 day gap between last estimated fill dates? No   Care Gaps: None noted  Star Rating Drugs: Atorvastatin 10 mg Last filled:09/07/21 90 DS Jardiance 25 mg Last filled:08/19/21 90 DS Metformin 500 mg Last filled:09/07/21 90 DS  Myriam Elta [place], [place]

## 2021-09-24 DIAGNOSIS — J430 Unilateral pulmonary emphysema [MacLeod's syndrome]: Secondary | ICD-10-CM | POA: Diagnosis not present

## 2021-10-01 DIAGNOSIS — J439 Emphysema, unspecified: Secondary | ICD-10-CM

## 2021-10-01 DIAGNOSIS — I10 Essential (primary) hypertension: Secondary | ICD-10-CM | POA: Diagnosis not present

## 2021-10-01 DIAGNOSIS — C3490 Malignant neoplasm of unspecified part of unspecified bronchus or lung: Secondary | ICD-10-CM | POA: Diagnosis not present

## 2021-10-01 DIAGNOSIS — N181 Chronic kidney disease, stage 1: Secondary | ICD-10-CM | POA: Diagnosis not present

## 2021-10-01 DIAGNOSIS — E1122 Type 2 diabetes mellitus with diabetic chronic kidney disease: Secondary | ICD-10-CM

## 2021-10-25 DIAGNOSIS — J430 Unilateral pulmonary emphysema [MacLeod's syndrome]: Secondary | ICD-10-CM | POA: Diagnosis not present

## 2021-11-06 ENCOUNTER — Ambulatory Visit
Admission: RE | Admit: 2021-11-06 | Discharge: 2021-11-06 | Disposition: A | Discharge status: Home / Self Care | Payer: Medicare Other | Source: Ambulatory Visit | Attending: Oncology | Admitting: Oncology

## 2021-11-06 DIAGNOSIS — I700 Atherosclerosis of aorta: Secondary | ICD-10-CM | POA: Diagnosis not present

## 2021-11-06 DIAGNOSIS — R911 Solitary pulmonary nodule: Secondary | ICD-10-CM | POA: Diagnosis not present

## 2021-11-06 DIAGNOSIS — C3492 Malignant neoplasm of unspecified part of left bronchus or lung: Secondary | ICD-10-CM | POA: Diagnosis not present

## 2021-11-06 DIAGNOSIS — R918 Other nonspecific abnormal finding of lung field: Secondary | ICD-10-CM | POA: Diagnosis not present

## 2021-11-06 DIAGNOSIS — C3490 Malignant neoplasm of unspecified part of unspecified bronchus or lung: Secondary | ICD-10-CM | POA: Diagnosis not present

## 2021-11-06 MED ORDER — IOHEXOL 300 MG/ML  SOLN
75.0000 mL | Freq: Once | INTRAMUSCULAR | Status: AC | PRN
  Administered 2021-11-06: 75 mL via INTRAVENOUS

## 2021-11-06 NOTE — Progress Notes (Signed)
[place]  [phone] [phone]) [phone]  ID: Kristopher [name] OB: 08-01-1960  MR#: [id number]  KKX#:[id number]  Patient Care Team: [name], DO as PCP - General (Family Medicine) Gavin Pound, CMA (Inactive) as Certified Medical Assistant Telford Nab, RN as Registered Nurse [name], MD as Referring Physician (Pain Medicine) [name], MD as Consulting Physician (Oncology) Noreene Filbert, MD as Consulting Physician (Radiation Oncology)  CHIEF COMPLAINT: Clinical stage IIIa squamous cell 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  INTERVAL HISTORY: Patient returns to clinic today for routine eval ration and discussion of his CT scan results.  He continues to feel well and remains asymptomatic.  He has a good appetite and denies weight loss.  He has no neurologic complaints.  He denies any recent fevers or illnesses.  He has no chest pain, shortness of breath, cough, or hemoptysis.  He denies any nausea, vomiting, constipation, or diarrhea.  He has no urinary complaints.  Patient offers no specific complaints today.  REVIEW OF SYSTEMS:   Review of Systems  Constitutional: Negative.  Negative for fever, malaise/fatigue and weight loss.  Respiratory: Negative.  Negative for cough and shortness of breath.   Cardiovascular: Negative.  Negative for chest pain and leg swelling.  Gastrointestinal: Negative.  Negative for abdominal pain and heartburn.  Genitourinary: Negative.  Negative for dysuria and flank pain.  Musculoskeletal: Negative.  Negative for back pain.  Skin: Negative.  Negative for rash.  Neurological: Negative.  Negative for dizziness, focal weakness, weakness and headaches.  Psychiatric/Behavioral: Negative.  The patient is not nervous/anxious.     As per HPI. Otherwise, a complete review of systems is negative.  PAST MEDICAL HISTORY: Past Medical History:  Diagnosis Date   Allergy    Arthritis    left  foot   Benign hypertensive kidney disease    Chronic back pain    Four rods in back   Diabetes mellitus, type 2 (HCC)    Dyspnea    GERD (gastroesophageal reflux disease)    Hypertension    Malignant neoplasm of lung (HCC)    Migraines    daily    PAST SURGICAL HISTORY: Past Surgical History:  Procedure Laterality Date   APPENDECTOMY     BACK SURGERY     COLONOSCOPY WITH PROPOFOL N/A 02/19/2016   Procedure: COLONOSCOPY WITH PROPOFOL;  Surgeon: Lucilla Lame, MD;  Location: [place];  Service: Endoscopy;  Laterality: N/A;   COLONOSCOPY WITH PROPOFOL N/A 01/19/2018   Procedure: COLONOSCOPY WITH PROPOFOL;  Surgeon: Lucilla Lame, MD;  Location: [place];  Service: Endoscopy;  Laterality: N/A;  Diabetic - oral meds   COLONOSCOPY WITH PROPOFOL N/A 03/16/2021   Procedure: COLONOSCOPY WITH PROPOFOL;  Surgeon: Lucilla Lame, MD;  Location: [place];  Service: Endoscopy;  Laterality: N/A;  INADEQUATE PREP   COLONOSCOPY WITH PROPOFOL N/A 04/06/2021   Procedure: COLONOSCOPY WITH PROPOFOL;  Surgeon: Lucilla Lame, MD;  Location: [place];  Service: Endoscopy;  Laterality: N/A;   DG OPERATIVE LEFT HIP ([place] HX)     10/19   ELECTROMAGNETIC NAVIGATION BROCHOSCOPY Left 11/18/2018   Procedure: ELECTROMAGNETIC NAVIGATION BRONCHOSCOPY;  Surgeon: Tyler Pita, MD;  Location: [facility];  Service: Cardiopulmonary;  Laterality: Left;   FLEXIBLE BRONCHOSCOPY Bilateral 01/20/2019   Procedure: FLEXIBLE BRONCHOSCOPY;  Surgeon: Ottie Glazier, MD;  Location: [facility];  Service: Thoracic;  Laterality: Bilateral;   FLEXIBLE BRONCHOSCOPY Bilateral 01/22/2019   Procedure: FLEXIBLE BRONCHOSCOPY;  Surgeon: Ottie Glazier,  MD;  Location: [facility];  Service: Thoracic;  Laterality: Bilateral;   FOOT SURGERY Left    Screws and plates   JOINT REPLACEMENT Left 12/2017   DR Rudene Christians Hip   KNEE SURGERY Left    X 2   LEG SURGERY     LUNG CANCER SURGERY     POLYPECTOMY N/A  02/19/2016   Procedure: POLYPECTOMY;  Surgeon: Lucilla Lame, MD;  Location: [place];  Service: Endoscopy;  Laterality: N/A;   POLYPECTOMY  01/19/2018   Procedure: POLYPECTOMY;  Surgeon: Lucilla Lame, MD;  Location: [place];  Service: Endoscopy;;   PORTACATH PLACEMENT N/A 11/11/2019   Procedure: INSERTION PORT-A-CATH;  Surgeon: Nestor Lewandowsky, MD;  Location: [facility];  Service: General;  Laterality: N/A;   THORACOTOMY Left 01/14/2019   Procedure: THORACOTOMY MAJOR, LEFT;  Surgeon: Nestor Lewandowsky, MD;  Location: [facility];  Service: General;  Laterality: Left;   TOTAL HIP ARTHROPLASTY Left 12/02/2017   Procedure: TOTAL HIP ARTHROPLASTY ANTERIOR APPROACH;  Surgeon: Hessie Knows, MD;  Location: [facility];  Service: Orthopedics;  Laterality: Left;   VIDEO BRONCHOSCOPY Left 01/14/2019   Procedure: VIDEO BRONCHOSCOPY WITH FLUORO, LEFT;  Surgeon: Nestor Lewandowsky, MD;  Location: [facility];  Service: General;  Laterality: Left;   VIDEO BRONCHOSCOPY WITH ENDOBRONCHIAL NAVIGATION N/A 10/15/2019   Procedure: VIDEO BRONCHOSCOPY WITH ENDOBRONCHIAL NAVIGATION;  Surgeon: Ottie Glazier, MD;  Location: [facility];  Service: Thoracic;  Laterality: N/A;   VIDEO BRONCHOSCOPY WITH ENDOBRONCHIAL ULTRASOUND N/A 10/15/2019   Procedure: VIDEO BRONCHOSCOPY WITH ENDOBRONCHIAL ULTRASOUND;  Surgeon: Ottie Glazier, MD;  Location: [facility];  Service: Thoracic;  Laterality: N/A;    FAMILY HISTORY: Family History  Problem Relation Age of Onset   Cancer Father    Diabetes Sister    Thrombosis Sister     ADVANCED DIRECTIVES (Y/N):  N  HEALTH MAINTENANCE: Social History   Tobacco Use   Smoking status: Former    Packs/day: 0.25    Years: 35.00    Total pack years: 8.75    Types: Cigarettes    Quit date: 01/14/2019    Years since quitting: 2.8   Smokeless tobacco: Never  Vaping Use   Vaping Use: Never used  Substance Use Topics   Alcohol use: No    Alcohol/week: 0.0 standard drinks of alcohol    Drug use: Never     Colonoscopy:  PAP:  Bone density:  Lipid panel:  Allergies  Allergen Reactions   Acetaminophen Swelling   Aspirin Anaphylaxis   Epinephrine Anaphylaxis    Does not include albuterol   Novocain [Procaine] Anaphylaxis   Penicillins Anaphylaxis    Has patient had a PCN reaction causing immediate rash, facial/tongue/throat swelling, SOB or lightheadedness with hypotension: Yes Has patient had a PCN reaction causing severe rash involving mucus membranes or skin necrosis: No Has patient had a PCN reaction that required hospitalization: Yes Has patient had a PCN reaction occurring within the last 10 years: No If all of the above answers are "NO", then may proceed with Cephalosporin use.    Strawberry Extract Anaphylaxis   Shellfish Allergy Hives and Nausea And Vomiting    Current Outpatient Medications  Medication Sig Dispense Refill   Accu-Chek FastClix Lancets MISC USE TO TEST BLOOD SUGAR 2X A DAY 100 each 12   albuterol (PROVENTIL) (2.5 MG/3ML) 0.083% nebulizer solution Take 3 mLs (2.5 mg total) by nebulization every 6 (six) hours as needed for wheezing or shortness of breath. 150 mL 1   albuterol (VENTOLIN  HFA) 108 (90 Base) MCG/ACT inhaler Inhale into the lungs.     amLODipine (NORVASC) 5 MG tablet TAKE 1 TABLET(5 MG) BY MOUTH TWICE DAILY 180 tablet 1   atorvastatin (LIPITOR) 10 MG tablet Take 1 tablet (10 mg total) by mouth daily at 6 PM. 90 tablet 1   BREZTRI AEROSPHERE 160-9-4.8 MCG/ACT AERO Inhale 2 puffs into the lungs 2 (two) times daily. Maintenance per pulmonology 5.9 g 2   diphenhydrAMINE (BENADRYL) 25 MG tablet Take 75 mg by mouth in the morning and at bedtime.     empagliflozin (JARDIANCE) 25 MG TABS tablet TAKE 1 TABLET(25 MG) BY MOUTH DAILY BEFORE BREAKFAST 90 tablet 1   esomeprazole (NEXIUM) 40 MG capsule TAKE 1 CAPSULE(40 MG) BY MOUTH DAILY 90 capsule 3   glucose blood (ACCU-CHEK GUIDE) test strip USE TO TEST BLOOD SUGAR 2X A DAY 100 strip 3    hydrALAZINE (APRESOLINE) 100 MG tablet Take 1 tablet (100 mg total) by mouth 2 (two) times daily. 180 tablet 1   metFORMIN (GLUCOPHAGE) 500 MG tablet Take 2 tablets (1,000 mg total) by mouth 2 (two) times daily with a meal. 360 tablet 1   metoprolol succinate (TOPROL-XL) 100 MG 24 hr tablet TAKE 1 TABLET BY MOUTH TWICE DAILY WITH OR IMMEDIATELY FOLLOWING A MEAL; TO BE TAKEN WITH 50 MG METOPROLOL TARTRATE 180 tablet 1   metoprolol tartrate (LOPRESSOR) 50 MG tablet TAKE 1 TABLET BY MOUTH TWICE DAILY. TO BE TAKEN WITH 100 MG METOPROLOL SUCCINATE 180 tablet 1   montelukast (SINGULAIR) 10 MG tablet Take 1 tablet (10 mg total) by mouth daily. 90 tablet 1   morphine (MSIR) 15 MG tablet Take 1 tablet (15 mg total) by mouth every 6 (six) hours as needed for moderate pain or severe pain. Must last 30 days. 120 tablet 0   Multiple Vitamin (MULTIVITAMIN WITH MINERALS) TABS tablet Take 1 tablet by mouth daily.     NONFORMULARY OR COMPOUNDED ITEM Apply 1-2 mLs topically 4 (four) times daily as needed. 10% Ketamine/2% Cyclobenzaprine/6% Gabapentin Cream 240 each PRN   nortriptyline (PAMELOR) 25 MG capsule Take 2 capsules (50 mg total) by mouth at bedtime. 180 capsule 1   Potassium Chloride ER 20 MEQ TBCR Take 1 tablet by mouth daily. 90 tablet 1   STIOLTO RESPIMAT 2.5-2.5 MCG/ACT AERS Inhale 2 puffs into the lungs 4 (four) times daily as needed. Prn per pulmonology notes 4 g 12   SUMAtriptan (IMITREX) 50 MG tablet Take 1 tablet (50 mg total) by mouth every 2 (two) hours as needed for migraine. Take 1 tab at onset of migraine. May repeat in 2 hours if headache persists or recurs. 10 tablet 12   lidocaine (LIDODERM) 5 % UNWRAP AND APPLY 1 PATCH ONTO THE SKIN DAILY. REMOVE AND DISCARD PATCH WITHIN 12 HOURS OR AS DIRECTED BY MD 28 patch 12   morphine (MSIR) 15 MG tablet Take 1 tablet (15 mg total) by mouth every 6 (six) hours as needed for moderate pain or severe pain. Must last 30 days. 120 tablet 0   No current  facility-administered medications for this visit.   Facility-Administered Medications Ordered in Other Visits  Medication Dose Route Frequency Provider Last Rate Last Admin   0.9 %  sodium chloride infusion    Continuous PRN Dionne Bucy, CRNA   New Bag at 01/22/19 0705   sodium chloride flush (NS) 0.9 % injection 10 mL  10 mL Intracatheter PRN [name], MD   10 mL at 03/01/20  9767    OBJECTIVE: Vitals:   11/08/21 1107  BP: 121/88  Pulse: 67  Resp: 16  Temp: 98 F (36.7 C)      Body mass index is 32.64 kg/m.    ECOG FS:0 - Asymptomatic  General: Well-developed, well-nourished, no acute distress. Eyes: Pink conjunctiva, anicteric sclera. HEENT: Normocephalic, moist mucous membranes. Lungs: No audible wheezing or coughing. Heart: Regular rate and rhythm. Abdomen: Soft, nontender, no obvious distention. Musculoskeletal: No edema, cyanosis, or clubbing. Neuro: Alert, answering all questions appropriately. Cranial nerves grossly intact. Skin: No rashes or petechiae noted. Psych: Normal affect.  LAB RESULTS:  Lab Results  Component Value Date   NA 140 09/07/2021   K 3.9 09/07/2021   CL 101 09/07/2021   CO2 24 09/07/2021   GLUCOSE 204 (H) 09/07/2021   BUN 8 09/07/2021   CREATININE 0.87 09/07/2021   CALCIUM 9.6 09/07/2021   PROT 7.3 09/07/2021   ALBUMIN 4.6 09/07/2021   AST 41 (H) 09/07/2021   ALT 53 (H) 09/07/2021   ALKPHOS 167 (H) 09/07/2021   BILITOT 0.3 09/07/2021   GFRNONAA >60 07/05/2021   GFRAA >60 12/01/2019    Lab Results  Component Value Date   WBC 6.0 09/07/2021   NEUTROABS 3.7 09/07/2021   HGB 13.8 09/07/2021   HCT 42.0 09/07/2021   MCV 91 09/07/2021   PLT 169 09/07/2021     STUDIES:   ONCOLOGY HISTORY: Patient underwent lung resection on January 13, 3418 and had a complicated postoperative course. CT scan results from August 14, 2019 reviewed independently with a suspicious subcarinal lymph node measuring 1.4 cm.  Follow-up PET scan  on August 23, 2019 revealed hypermetabolism in lymph node highly suspicious for recurrence.  Biopsy confirmed recurrence increasing patient's stage from IA up to IIIA. Patient completed concurrent XRT and weekly carboplatinum and Taxol on January 05, 2020.  Patient completed 1 year of maintenance durvalumab on January 17, 2021.  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  PLAN:    1.  Clinical stage IIIa squamous cell carcinoma of the left lower lobe lung: CT scan results from November 07, 2021 reviewed independently and reported as above with no obvious evidence of recurrent or progressive disease.  No intervention is needed at this time.  Continue routine screening every 6 months until patient is at least 2 years removed from completing his maintenance treatment in November 2024 at which time patient can be transition to yearly imaging and visits.  Return to clinic in 6 months with repeat CT scan and further evaluation.   2.  Pain: Patient does not complain of this today.  Unrelated to the of malignancy.  Continue monitoring and treatment per pain clinic. 3.  Hypokalemia: Resolved. 5.  Transaminitis: Chronic and unchanged since August 2022.  Monitor.  Patient expressed understanding and was in agreement with this plan. He also understands that He can call clinic at any time with any questions, concerns, or complaints.    Cancer Staging  Squamous cell carcinoma of left lung [facility]) Staging form: Lung, AJCC 8th Edition - Clinical stage from 02/12/2019: Stage IIIA (cT1b, cN2, cM0) - Signed by [name], MD on 11/01/2019   [name], MD   11/09/2021 3:19 PM

## 2021-11-08 ENCOUNTER — Inpatient Hospital Stay: Payer: Medicare Other | Attending: Oncology | Admitting: Oncology

## 2021-11-08 ENCOUNTER — Ambulatory Visit
Admission: RE | Admit: 2021-11-08 | Discharge: 2021-11-08 | Disposition: A | Discharge status: Home / Self Care | Payer: Medicare Other | Source: Ambulatory Visit | Attending: Radiation Oncology | Admitting: Radiation Oncology

## 2021-11-08 ENCOUNTER — Other Ambulatory Visit: Payer: Self-pay | Admitting: Nurse Practitioner

## 2021-11-08 ENCOUNTER — Encounter: Payer: Self-pay | Admitting: Oncology

## 2021-11-08 VITALS — BP 121/88 | HR 67 | Temp 98.0°F | Resp 16 | Ht 70.0 in | Wt 227.5 lb

## 2021-11-08 VITALS — BP 126/85 | HR 72 | Temp 97.5°F | Resp 18 | Ht 70.0 in | Wt 228.0 lb

## 2021-11-08 DIAGNOSIS — E1122 Type 2 diabetes mellitus with diabetic chronic kidney disease: Secondary | ICD-10-CM | POA: Diagnosis not present

## 2021-11-08 DIAGNOSIS — G8929 Other chronic pain: Secondary | ICD-10-CM | POA: Insufficient documentation

## 2021-11-08 DIAGNOSIS — C3432 Malignant neoplasm of lower lobe, left bronchus or lung: Secondary | ICD-10-CM | POA: Diagnosis not present

## 2021-11-08 DIAGNOSIS — M545 Low back pain, unspecified: Secondary | ICD-10-CM | POA: Diagnosis not present

## 2021-11-08 DIAGNOSIS — Z79899 Other long term (current) drug therapy: Secondary | ICD-10-CM | POA: Insufficient documentation

## 2021-11-08 DIAGNOSIS — Z8249 Family history of ischemic heart disease and other diseases of the circulatory system: Secondary | ICD-10-CM | POA: Insufficient documentation

## 2021-11-08 DIAGNOSIS — Z9049 Acquired absence of other specified parts of digestive tract: Secondary | ICD-10-CM | POA: Insufficient documentation

## 2021-11-08 DIAGNOSIS — C3492 Malignant neoplasm of unspecified part of left bronchus or lung: Secondary | ICD-10-CM | POA: Diagnosis not present

## 2021-11-08 DIAGNOSIS — Z880 Allergy status to penicillin: Secondary | ICD-10-CM | POA: Diagnosis not present

## 2021-11-08 DIAGNOSIS — Z809 Family history of malignant neoplasm, unspecified: Secondary | ICD-10-CM | POA: Insufficient documentation

## 2021-11-08 DIAGNOSIS — Z886 Allergy status to analgesic agent status: Secondary | ICD-10-CM | POA: Diagnosis not present

## 2021-11-08 DIAGNOSIS — Z87891 Personal history of nicotine dependence: Secondary | ICD-10-CM | POA: Insufficient documentation

## 2021-11-08 DIAGNOSIS — C771 Secondary and unspecified malignant neoplasm of intrathoracic lymph nodes: Secondary | ICD-10-CM | POA: Diagnosis not present

## 2021-11-08 DIAGNOSIS — K219 Gastro-esophageal reflux disease without esophagitis: Secondary | ICD-10-CM | POA: Diagnosis not present

## 2021-11-08 DIAGNOSIS — R7401 Elevation of levels of liver transaminase levels: Secondary | ICD-10-CM | POA: Insufficient documentation

## 2021-11-08 DIAGNOSIS — C3412 Malignant neoplasm of upper lobe, left bronchus or lung: Secondary | ICD-10-CM

## 2021-11-08 DIAGNOSIS — I129 Hypertensive chronic kidney disease with stage 1 through stage 4 chronic kidney disease, or unspecified chronic kidney disease: Secondary | ICD-10-CM | POA: Insufficient documentation

## 2021-11-08 DIAGNOSIS — Z833 Family history of diabetes mellitus: Secondary | ICD-10-CM | POA: Diagnosis not present

## 2021-11-08 DIAGNOSIS — N181 Chronic kidney disease, stage 1: Secondary | ICD-10-CM | POA: Diagnosis not present

## 2021-11-08 NOTE — Progress Notes (Signed)
Radiation Oncology Follow up Note  Name: Kristopher [name]   Date:   11/08/2021 MRN:  [id number] DOB: [birth year]    This 61 y.o. male presents to the clinic today for 2-year follow-up status post salvage radiation therapy inpatient status post left lower lobe resection for stage IIIa squamous cell carcinoma.  REFERRING PROVIDER: [name], DO  HPI: Patient is a 61 year old male now seen at 22 months after completion of salvage radiation therapy and patient with known stage IIIa squamous cell carcinoma status post left lower lobe resection.  Seen today in routine follow-up he is doing well specifically denies cough hemoptysis or chest tightness..  Recent CT scan shows patient is status post left lower lobectomy there is some increased perihilar soft tissue surrounding fibrotic changes secondary to radiation.  This is stable compared to prior study of May 2023.  COMPLICATIONS OF TREATMENT: none  FOLLOW UP COMPLIANCE: keeps appointments   PHYSICAL EXAM:  BP 126/85   Pulse 72   Temp (!) 97.5 F (36.4 C)   Resp 18   Ht 5\' 10"  (1.778 m)   Wt 228 lb (103.4 kg)   BMI 32.71 kg/m  Well-developed well-nourished patient in NAD. HEENT reveals PERLA, EOMI, discs not visualized.  Oral cavity is clear. No oral mucosal lesions are identified. Neck is clear without evidence of cervical or supraclavicular adenopathy. Lungs are clear to A&P. Cardiac examination is essentially unremarkable with regular rate and rhythm without murmur rub or thrill. Abdomen is benign with no organomegaly or masses noted. Motor sensory and DTR levels are equal and symmetric in the upper and lower extremities. Cranial nerves II through XII are grossly intact. Proprioception is intact. No peripheral adenopathy or edema is identified. No motor or sensory levels are noted. Crude visual fields are within normal range.  RADIOLOGY RESULTS: CT scan reviewed compared to prior studies compatible with above-stated findings  PLAN:  Present time patient is doing well with no evidence of progressive disease.  And pleased with his overall progress.  Of asked to see him back in 1 year for follow-up.  Assume CT scan will be performed prior to that visit although if not we will order 1.  Patient knows to call with any concerns.  I would like to take this opportunity to thank you for allowing me to participate in the care of your patient.Noreene Filbert, MD

## 2021-11-09 NOTE — Telephone Encounter (Signed)
Requested medication (s) are due for refill today: yes  Requested medication (s) are on the active medication list: yes  Last refill:  09/14/20 #30 with 12 RF  Future visit scheduled: 12/10/21, seen 09/07/21  Notes to clinic:  Unsure if to be continued, was written one year ago and not mentioned at recent visit. Please assess       Requested Prescriptions  Pending Prescriptions Disp Refills   lidocaine (LIDODERM) 5 % [Pharmacy Med Name: LIDOCAINE 5% PATCH] 28 patch     Sig: UNWRAP AND APPLY 1 PATCH ONTO THE SKIN DAILY. REMOVE AND DISCARD PATCH WITHIN 12 HOURS OR AS DIRECTED BY MD     Analgesics:  Topicals Failed - 11/08/2021  3:39 AM      Failed - Manual Review: Labs are only required if the patient has taken medication for more than 8 weeks.      Passed - PLT in normal range and within 360 days    Platelets  Date Value Ref Range Status  09/07/2021 169 150 - 450 x10E3/uL Final         Passed - HGB in normal range and within 360 days    Hemoglobin  Date Value Ref Range Status  09/07/2021 13.8 13.0 - 17.7 g/dL Final         Passed - HCT in normal range and within 360 days    Hematocrit  Date Value Ref Range Status  09/07/2021 42.0 37.5 - 51.0 % Final         Passed - Cr in normal range and within 360 days    Creatinine, Ser  Date Value Ref Range Status  09/07/2021 0.87 0.76 - 1.27 mg/dL Final         Passed - eGFR is 30 or above and within 360 days    GFR calc Af Amer  Date Value Ref Range Status  12/01/2019 >60 >60 mL/min Final   GFR, Estimated  Date Value Ref Range Status  07/05/2021 >60 >60 mL/min Final    Comment:    (NOTE) Calculated using the CKD-EPI Creatinine Equation (2021)    eGFR  Date Value Ref Range Status  09/07/2021 99 >59 mL/min/1.73 Final         Passed - Patient is not pregnant      Passed - Valid encounter within last 12 months    Recent Outpatient Visits           2 months ago Routine general medical examination at a health care  facility   [facility], [place] P, DO   6 months ago COPD exacerbation [facility])   [place], [name], DO   6 months ago COPD exacerbation [facility])   [place], [place] P, DO   7 months ago Sinus congestion   [facility] [place] [place] [address] T, NP   8 months ago Skin lesion of back   [place], [place], DO       Future Appointments             In 1 month Johnson, Barb Merino, DO [employer], [name]

## 2021-11-11 NOTE — Progress Notes (Unsigned)
PROVIDER NOTE: Information contained herein reflects review and annotations entered in association with encounter. Interpretation of such information and data should be left to medically-trained personnel. Information provided to patient can be located elsewhere in the medical record under "Patient Instructions". Document created using STT-dictation technology, any transcriptional errors that may result from process are unintentional.    Patient: Kristopher [name]  Service Category: E/M  Provider: Gaspar Cola, MD  DOB: February 18, 1961  DOS: 11/12/2021  Referring Provider: [name], DO  MRN: [id number]  Specialty: Interventional Pain Management  PCP: [name], DO  Type: Established Patient  Setting: Ambulatory outpatient    Location: Office  Delivery: Face-to-face     HPI  [name], a 61 y.o. year old male, is here today because of his Chronic pain syndrome [G89.4]. Mr. Wilden primary complain today is No chief complaint on file. Last encounter: My last encounter with him was on 08/13/2021. Pertinent problems: Mr. Blakeman has Chronic low back pain (1ry area of Pain) (Bilateral) (L>R); Lumbar spondylosis; Chronic lumbar radicular pain (S1 dermatomal) (Left); 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); Chronic neck pain (posterior midline) (Bilateral) (L>R); Cervical spondylosis; Chronic cervical radicular pain (Bilateral) (C5/C6 dermatome) (L>R); Cervical spinal stenosis (C4-5); Cervical foraminal stenosis (Bilateral C5-6); Retrolisthesis of L5-S1; Cervical disc herniation (C4-5 and C5-6); Lumbar disc herniation (L5-S1); Chronic sacroiliac joint pain (Bilateral) (L>R); Chronic hip pain (Left); Lumbar facet syndrome (Bilateral) (L>R); Chronic lower extremity pain (2ry area of Pain) (Left); Greater occipital neuralgia (Right); Chronic pain syndrome; Migraine; Musculoskeletal pain, chronic; Left-sided weakness; Hip arthritis; S/P hip replacement; Squamous cell  carcinoma of left lung (HCC); and Chronic upper extremity pain (3ry area of Pain) (Bilateral) (L>R) on their pertinent problem list. Pain Assessment: Severity of   is reported as a  /10. Location:    / . Onset:  . Quality:  . Timing:  . Modifying factor(s):  [name] Vitals:  vitals were not taken for this visit.   Reason for encounter: medication management. ***  RTCB: 02/13/2022  Pharmacotherapy Assessment  Analgesic: Morphine IR 15 mg, 1 tablet p.o. every 6 hours (60 mg/day of morphine) (60 MME) + Compounded Specialty Analgesic Cream (w/ Ketamine) MME/day: 60 mg/day.   Monitoring: [place] PMP: PDMP reviewed during this encounter.       Pharmacotherapy: No side-effects or adverse reactions reported. Compliance: No problems identified. Effectiveness: Clinically acceptable.  No notes on file  No results found for: "CBDTHCR" No results found for: "D8THCCBX" No results found for: "D9THCCBX"  UDS:  Summary  Date Value Ref Range Status  05/14/2021 Note  Final    Comment:    ==================================================================== [employer] Select 13 (MW) ==================================================================== Test                             Result       Flag       Units  Drug Present and Declared for Prescription Verification   Morphine                       >[id number]       EXPECTED   ng/mg creat   Normorphine                    80           EXPECTED   ng/mg creat    Potential sources of large amounts of morphine in the  absence of    codeine include administration of morphine or use of heroin.     Normorphine is an expected metabolite of morphine.  ==================================================================== Test                      Result    Flag   Units      Ref Range   Creatinine              81               mg/dL      >=20 ==================================================================== Declared Medications:  The flagging and interpretation on  this report are based on the  following declared medications.  Unexpected results may arise from  inaccuracies in the declared medications.   **Note: The testing scope of this panel includes these medications:   Morphine (MSIR)   **Note: The testing scope of this panel does not include the  following reported medications:   Albuterol (Ventolin HFA)  Amlodipine (Norvasc)  Atorvastatin (Lipitor)  Budesonide (Breztri Aerosphere)  Diphenhydramine (Benadryl)  Empagliflozin (Jardiance)  Esomeprazole (Nexium)  Formoterol (Breztri Aerosphere)  Glycopyrrolate (Breztri Aerosphere)  Hydralazine (Apresoline)  Metformin (Glucophage)  Metoprolol (Toprol)  Montelukast (Singulair)  Multivitamin  Nortriptyline (Pamelor)  Olodaterol (Stiolto Respimat)  Potassium  Sumatriptan (Imitrex)  Tiotropium (Stiolto Respimat)  Topical Cyclobenzaprine  Topical Gabapentin  Topical Ketamine  Topical Lidocaine (Lidoderm) ==================================================================== For clinical consultation, please call [phone]. ====================================================================       ROS  Constitutional: Denies any fever or chills Gastrointestinal: No reported hemesis, hematochezia, vomiting, or acute GI distress Musculoskeletal: Denies any acute onset joint swelling, redness, loss of ROM, or weakness Neurological: No reported episodes of acute onset apraxia, aphasia, dysarthria, agnosia, amnesia, paralysis, loss of coordination, or loss of consciousness  Medication Review  Accu-Chek FastClix Lancets, Budeson-Glycopyrrol-Formoterol, NONFORMULARY OR COMPOUNDED ITEM, Potassium Chloride ER, SUMAtriptan, Tiotropium Bromide-Olodaterol, albuterol, amLODipine, atorvastatin, diphenhydrAMINE, empagliflozin, esomeprazole, glucose blood, hydrALAZINE, lidocaine, metFORMIN, metoprolol succinate, metoprolol tartrate, montelukast, morphine, multivitamin with minerals, and  nortriptyline  History Review  Allergy: Mr. Cardenas is allergic to acetaminophen, aspirin, epinephrine, novocain [procaine], penicillins, strawberry extract, and shellfish allergy. Drug: Mr. Minus  reports no history of drug use. Alcohol:  reports no history of alcohol use. Tobacco:  reports that he quit smoking about 2 years ago. His smoking use included cigarettes. He has a 8.75 pack-year smoking history. He has never used smokeless tobacco. Social: Mr. Bohlman  reports that he quit smoking about 2 years ago. His smoking use included cigarettes. He has a 8.75 pack-year smoking history. He has never used smokeless tobacco. He reports that he does not drink alcohol and does not use drugs. Medical:  has a past medical history of Allergy, Arthritis, Benign hypertensive kidney disease, Chronic back pain, Diabetes mellitus, type 2 ([place]), Dyspnea, GERD (gastroesophageal reflux disease), Hypertension, Malignant neoplasm of lung ([place]), and Migraines. Surgical: Mr. Kirby  has a past surgical history that includes Back surgery; Appendectomy; Leg Surgery; Knee surgery (Left); Foot surgery (Left); Colonoscopy with propofol (N/A, 02/19/2016); polypectomy (N/A, 02/19/2016); Total hip arthroplasty (Left, 12/02/2017); Colonoscopy with propofol (N/A, 01/19/2018); polypectomy (01/19/2018); DG OPERATIVE LEFT HIP ([place] HX); Joint replacement (Left, 12/2017); Video bronchoscopy (Left, 01/14/2019); Thoracotomy (Left, 01/14/2019); Flexible bronchoscopy (Bilateral, 01/20/2019); Flexible bronchoscopy (Bilateral, 01/22/2019); Electormagnetic navigation bronchoscopy (Left, 11/18/2018); Lung cancer surgery; Video bronchoscopy with endobronchial ultrasound (N/A, 10/15/2019); Video bronchoscopy with endobronchial navigation (N/A, 10/15/2019); Portacath placement (N/A, 11/11/2019); Colonoscopy with propofol (N/A, 03/16/2021); and Colonoscopy with propofol (N/A, 04/06/2021).  Family: family history includes Cancer in his father; Diabetes in his  sister; Thrombosis in his sister.  Laboratory Chemistry Profile   Renal Lab Results  Component Value Date   BUN 8 09/07/2021   CREATININE 0.87 09/07/2021   BCR 9 09/07/2021   GFRAA >60 12/01/2019   GFRNONAA >60 07/05/2021    Hepatic Lab Results  Component Value Date   AST 41 (H) 09/07/2021   ALT 53 (H) 09/07/2021   ALBUMIN 4.6 09/07/2021   ALKPHOS 167 (H) 09/07/2021   LIPASE 50 02/17/2019    Electrolytes Lab Results  Component Value Date   NA 140 09/07/2021   K 3.9 09/07/2021   CL 101 09/07/2021   CALCIUM 9.6 09/07/2021   MG 2.4 01/25/2019   PHOS 3.5 01/23/2019    Bone No results found for: "VD25OH", "VD125OH2TOT", "YH0623JS2", "GB1517OH6", "25OHVITD1", "25OHVITD2", "07PXTGGY6", "TESTOFREE", "TESTOSTERONE"  Inflammation (CRP: Acute Phase) (ESR: Chronic Phase) Lab Results  Component Value Date   CRP 0.7 04/03/2015   ESRSEDRATE 11 10/21/2017   LATICACIDVEN 1.7 01/16/2019         Note: Above Lab results reviewed.  Recent Imaging Review  CT CHEST W CONTRAST CLINICAL DATA:  Follow-up lung cancer. Status post left lower lobectomy.  EXAM: CT CHEST WITH CONTRAST  TECHNIQUE: Multidetector CT imaging of the chest was performed during intravenous contrast administration.  RADIATION DOSE REDUCTION: This exam was performed according to the departmental dose-optimization program which includes automated exposure control, adjustment of the mA and/or kV according to patient size and/or use of iterative reconstruction technique.  ***body onc***  CONTRAST:  74m OMNIPAQUE IOHEXOL 300 MG/ML  SOLN  COMPARISON:  07/03/2021  FINDINGS: Cardiovascular: Aortic atherosclerotic calcifications. Normal heart size. No pericardial effusion.  Mediastinum/Nodes: No enlarged mediastinal, hilar, or axillary lymph nodes. Thyroid gland, trachea, and esophagus demonstrate no significant findings.  Lungs/Pleura: No pleural effusion identified. Status post left lower lobectomy.  Again noted is increased left perihilar soft tissue with surrounding fibrotic changes which is presumed to represent changes secondary to external beam radiation. On today's study this area measures 5.4 cm in maximum AP dome in shin and 1.3 cm transverse dimension. On the previous exam this measured 6 cm in AP dimension and 1.5 cm transverse.  Subpleural nodule within the posterior right upper lobe is stable measuring 4 mm, image 38/3. Stable faint nodule in the posterior right upper lung measuring 2 mm, image 35/3.  Upper Abdomen: No acute abnormality. No adrenal mass identified. Calcified granulomas identified within the spleen.  Musculoskeletal: No chest wall abnormality. No acute or significant osseous findings.  IMPRESSION: 1. Status post left lower lobectomy. There is increased left perihilar soft tissue with surrounding fibrotic changes which is presumed to represent changes secondary to external beam radiation. This is stable when compared with the study from 07/03/2021. 2. Stable small nonspecific pulmonary nodules. 3. Aortic Atherosclerosis (ICD10-I70.0).  Electronically Signed   By: TKerby MoorsM.D.   On: 11/07/2021 06:23 Note: Reviewed        Physical Exam  General appearance: Well nourished, well developed, and well hydrated. In no apparent acute distress Mental status: Alert, oriented x 3 (person, place, & time)       Respiratory: No evidence of acute respiratory distress Eyes: PERLA Vitals: There were no vitals taken for this visit. BMI: Estimated body mass index is 32.64 kg/m as calculated from the following:   Height as of 11/08/21: _0  (1.778 m).   Weight as of 11/08/21: 227 lb 8 oz (  103.2 kg). Ideal: Ideal body weight: 73 kg (160 lb 15 oz) Adjusted ideal body weight: 85.1 kg (187 lb 9 oz)  Assessment   Diagnosis Status  1. Chronic pain syndrome   2. Chronic low back pain (1ry area of Pain) (Bilateral) (L>R)   3. Chronic lower extremity pain (2ry  area of Pain) (Left)   4. Chronic upper extremity pain (3ry area of Pain) (Bilateral) (L>R)   5. 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)   6. Squamous cell carcinoma of left lung ([place])   7. Pharmacologic therapy   8. Chronic use of opiate for therapeutic purpose   9. Encounter for medication management   10. Encounter for chronic pain management    Controlled Controlled Controlled   Updated Problems: No problems updated.  Plan of Care  Problem-specific:  No problem-specific Assessment & Plan notes found for this encounter.  Mr. ARIV PENROD has a current medication list which includes the following long-term medication(s): albuterol, albuterol, amlodipine, atorvastatin, esomeprazole, hydralazine, metformin, metoprolol succinate, metoprolol tartrate, montelukast, morphine, nortriptyline, potassium chloride er, and sumatriptan.  Pharmacotherapy (Medications Ordered): No orders of the defined types were placed in this encounter.  Orders:  No orders of the defined types were placed in this encounter.  Follow-up plan:   No follow-ups on file.     Interventional Therapies  Risk  Complexity Considerations:   Estimated body mass index is 32.71 kg/m as calculated from the following:   Height as of this encounter: _0  (1.778 m).   Weight as of this encounter: 228 lb (103.4 kg). WNL   Planned  Pending:   None at this time. Claims to have anaphylactic allergy to Local Anesthetics. He was sent for testing and shown/proven not to be allergic to Lidocaine.    Under consideration:   None   Completed:   We have now called his compounding pharmacy twice, one in 2022 and on 08/13/2021 and they have confirmed that they do on her day 1 year refill prescription for his nonformulary cream.  The last prescription was sent to them on 07/31/2021 and should be good until 07/31/2022.  They have confirmed on 08/13/2021 that they are in position of this prescription.    Therapeutic  Palliative (PRN) options:   None.     Recent Visits Date Type Provider Dept  08/13/21 Office Visit [name], MD [facility]  Showing recent visits within past 90 days and meeting all other requirements Future Appointments Date Type Provider Dept  11/12/21 Appointment [name], MD [facility]  Showing future appointments within next 90 days and meeting all other requirements  I discussed the assessment and treatment plan with the patient. The patient was provided an opportunity to ask questions and all were answered. The patient agreed with the plan and demonstrated an understanding of the instructions.  Patient advised to call back or seek an in-person evaluation if the symptoms or condition worsens.  Duration of encounter: *** minutes.  Total time on encounter, as per AMA guidelines included both the face-to-face and non-face-to-face time personally spent by the physician and/or other qualified health care professional(s) on the day of the encounter (includes time in activities that require the physician or other qualified health care professional and does not include time in activities normally performed by clinical staff). Physician's time may include the following activities when performed: preparing to see the patient (eg, review of tests, pre-charting review of records) obtaining and/or reviewing separately obtained history  performing a medically appropriate examination and/or evaluation counseling and educating the patient/family/caregiver ordering medications, tests, or procedures referring and communicating with other health care professionals (when not separately reported) documenting clinical information in the electronic or other health record independently interpreting results (not separately reported) and communicating results to the patient/ family/caregiver care coordination (not separately reported)  Note by:  Gaspar Cola, MD Date: 11/12/2021; Time: 10:28 AM

## 2021-11-12 ENCOUNTER — Ambulatory Visit: Payer: Medicare Other | Attending: Pain Medicine | Admitting: Pain Medicine

## 2021-11-12 ENCOUNTER — Encounter: Payer: Self-pay | Admitting: Pain Medicine

## 2021-11-12 VITALS — BP 134/97 | HR 73 | Temp 97.3°F | Resp 16 | Ht 70.0 in | Wt 229.0 lb

## 2021-11-12 DIAGNOSIS — G894 Chronic pain syndrome: Secondary | ICD-10-CM | POA: Diagnosis not present

## 2021-11-12 DIAGNOSIS — C3492 Malignant neoplasm of unspecified part of left bronchus or lung: Secondary | ICD-10-CM | POA: Diagnosis not present

## 2021-11-12 DIAGNOSIS — M5442 Lumbago with sciatica, left side: Secondary | ICD-10-CM | POA: Insufficient documentation

## 2021-11-12 DIAGNOSIS — M961 Postlaminectomy syndrome, not elsewhere classified: Secondary | ICD-10-CM | POA: Insufficient documentation

## 2021-11-12 DIAGNOSIS — Z79891 Long term (current) use of opiate analgesic: Secondary | ICD-10-CM | POA: Diagnosis not present

## 2021-11-12 DIAGNOSIS — G8929 Other chronic pain: Secondary | ICD-10-CM | POA: Insufficient documentation

## 2021-11-12 DIAGNOSIS — Z79899 Other long term (current) drug therapy: Secondary | ICD-10-CM | POA: Insufficient documentation

## 2021-11-12 DIAGNOSIS — M5441 Lumbago with sciatica, right side: Secondary | ICD-10-CM | POA: Diagnosis not present

## 2021-11-12 DIAGNOSIS — M79605 Pain in left leg: Secondary | ICD-10-CM | POA: Diagnosis not present

## 2021-11-12 DIAGNOSIS — M79602 Pain in left arm: Secondary | ICD-10-CM | POA: Insufficient documentation

## 2021-11-12 DIAGNOSIS — M79601 Pain in right arm: Secondary | ICD-10-CM | POA: Insufficient documentation

## 2021-11-12 MED ORDER — MORPHINE SULFATE 15 MG PO TABS: 15.0000 mg | ORAL_TABLET | Freq: Four times a day (QID) | ORAL | 0 refills | Status: DC | PRN

## 2021-11-12 NOTE — Progress Notes (Signed)
/  Nursing Pain Medication Assessment:  Safety precautions to be maintained throughout the outpatient stay will include: orient to surroundings, keep bed in low position, maintain call bell within reach at all times, provide assistance with transfer out of bed and ambulation.  Medication Inspection Compliance: Pill count conducted under aseptic conditions, in front of the patient. Neither the pills nor the bottle was removed from the patient's sight at any time. Once count was completed pills were immediately returned to the patient in their original bottle.  Medication: Morphine IR 16 of 120 pills remain Pill/Patch Count:  Pill/Patch Appearance: Markings consistent with prescribed medication Bottle Appearance: Standard pharmacy container. Clearly labeled. Filled Date: 08 / 15 / 2023 Last Medication intake:  Today

## 2021-11-12 NOTE — Patient Instructions (Signed)

## 2021-11-14 ENCOUNTER — Telehealth: Payer: Self-pay

## 2021-11-14 NOTE — Progress Notes (Signed)
Chronic Care Management Pharmacy Assistant   Name: Kristopher [name]  MRN: [id number] DOB: Jul 09, 1960   Reason for Encounter: Disease State-General    Recent office visits:  11/12/21 [name], MD-Pain Medicine (breast problem) Orders placed: none, Medication changes: none  Recent consult visits:  11/08/21 [name], MD-Onocology (Lung cancer) Orders placed: CT Chest W Contrast; Medication changes: Morphine sulfate 15 mg  Hospital visits:  None since the last coordination call  Medications: Outpatient Encounter Medications as of 11/14/2021  Medication Sig Note   Accu-Chek FastClix Lancets MISC USE TO TEST BLOOD SUGAR 2X A DAY    albuterol (PROVENTIL) (2.5 MG/3ML) 0.083% nebulizer solution Take 3 mLs (2.5 mg total) by nebulization every 6 (six) hours as needed for wheezing or shortness of breath.    albuterol (VENTOLIN HFA) 108 (90 Base) MCG/ACT inhaler Inhale into the lungs.    amLODipine (NORVASC) 5 MG tablet TAKE 1 TABLET(5 MG) BY MOUTH TWICE DAILY    atorvastatin (LIPITOR) 10 MG tablet Take 1 tablet (10 mg total) by mouth daily at 6 PM.    BREZTRI AEROSPHERE 160-9-4.8 MCG/ACT AERO Inhale 2 puffs into the lungs 2 (two) times daily. Maintenance per pulmonology    diphenhydrAMINE (BENADRYL) 25 MG tablet Take 75 mg by mouth in the morning and at bedtime.    empagliflozin (JARDIANCE) 25 MG TABS tablet TAKE 1 TABLET(25 MG) BY MOUTH DAILY BEFORE BREAKFAST    esomeprazole (NEXIUM) 40 MG capsule TAKE 1 CAPSULE(40 MG) BY MOUTH DAILY    glucose blood (ACCU-CHEK GUIDE) test strip USE TO TEST BLOOD SUGAR 2X A DAY    hydrALAZINE (APRESOLINE) 100 MG tablet Take 1 tablet (100 mg total) by mouth 2 (two) times daily.    lidocaine (LIDODERM) 5 % UNWRAP AND APPLY 1 PATCH ONTO THE SKIN DAILY. REMOVE AND DISCARD PATCH WITHIN 12 HOURS OR AS DIRECTED BY MD    metFORMIN (GLUCOPHAGE) 500 MG tablet Take 2 tablets (1,000 mg total) by mouth 2 (two) times daily with a meal.    metoprolol  succinate (TOPROL-XL) 100 MG 24 hr tablet TAKE 1 TABLET BY MOUTH TWICE DAILY WITH OR IMMEDIATELY FOLLOWING A MEAL; TO BE TAKEN WITH 50 MG METOPROLOL TARTRATE    metoprolol tartrate (LOPRESSOR) 50 MG tablet TAKE 1 TABLET BY MOUTH TWICE DAILY. TO BE TAKEN WITH 100 MG METOPROLOL SUCCINATE    montelukast (SINGULAIR) 10 MG tablet Take 1 tablet (10 mg total) by mouth daily.    [START ON 11/15/2021] morphine (MSIR) 15 MG tablet Take 1 tablet (15 mg total) by mouth every 6 (six) hours as needed for moderate pain or severe pain. Must last 30 days.    [START ON 12/15/2021] morphine (MSIR) 15 MG tablet Take 1 tablet (15 mg total) by mouth every 6 (six) hours as needed for moderate pain or severe pain. Must last 30 days.    [START ON 01/14/2022] morphine (MSIR) 15 MG tablet Take 1 tablet (15 mg total) by mouth every 6 (six) hours as needed for moderate pain or severe pain. Must last 30 days. 11/12/2021: WARNING: Not a Duplicate. Future prescription. DO NOT DELETE during hospital medication reconciliation or at discharge. [facility] Patient    Multiple Vitamin (MULTIVITAMIN WITH MINERALS) TABS tablet Take 1 tablet by mouth daily.    NONFORMULARY OR COMPOUNDED ITEM Apply 1-2 mLs topically 4 (four) times daily as needed. 10% Ketamine/2% Cyclobenzaprine/6% Gabapentin Cream 08/13/2021: WARNING: Not a Duplicate. Future prescription. DO NOT DELETE during hospital medication reconciliation  or at discharge. [facility] Patient    nortriptyline (PAMELOR) 25 MG capsule Take 2 capsules (50 mg total) by mouth at bedtime.    Potassium Chloride ER 20 MEQ TBCR Take 1 tablet by mouth daily.    STIOLTO RESPIMAT 2.5-2.5 MCG/ACT AERS Inhale 2 puffs into the lungs 4 (four) times daily as needed. Prn per pulmonology notes    SUMAtriptan (IMITREX) 50 MG tablet Take 1 tablet (50 mg total) by mouth every 2 (two) hours as needed for migraine. Take 1 tab at onset of migraine. May repeat in 2 hours if headache  persists or recurs.    Facility-Administered Encounter Medications as of 11/14/2021  Medication   0.9 %  sodium chloride infusion   sodium chloride flush (NS) 0.9 % injection 10 mL   Contacted [name] for [employer] Review Call   Chart Review:  Have there been any documented new, changed, or discontinued medications since last visit? Yes (If yes, include name, dose, frequency, date) Has there been any documented recent hospitalizations or ED visits since last visit with Clinical Pharmacist? No Brief Summary (including medication and/or Diagnosis changes):   Adherence Review:  Does the Clinical Pharmacist Assistant have access to adherence rates? Yes Adherence rates for STAR metric medications (List medication(s)/day supply/ last 2 fill dates). Adherence rates for medications indicated for disease state being reviewed (List medication(s)/day supply/ last 2 fill dates). Does the patient have >5 day gap between last estimated fill dates for any of the above medications or other medication gaps? Yes Reason for medication gaps.   Disease State Questions:  Able to connect with Patient? Yes  Did patient have any problems with their health recently? No Note problems and Concerns:  Have you had any admissions or emergency room visits or worsening of your condition(s) since last visit? No Details of ED visit, hospital visit and/or worsening condition(s):  Have you had any visits with new specialists or providers since your last visit? No Explain:  Have you had any new health care problem(s) since your last visit? No New problem(s) reported:  Have you run out of any of your medications since you last spoke with clinical pharmacist? Yes What caused you to run out of your medications? Patient states that he is completely out of his Jardiance and esomeprazole. He states that [facility] were unable to fill. Called the pharmacy, both prescriptions are on file and ready to be pick up.  Called the patient back to inform him that he can pick up prescriptions today.  Are there any medications you are not taking as prescribed? No What kept you from taking your medications as prescribed?  Are you having any issues or side effects with your medications? No Note of issues or side effects:  Do you have any other health concerns or questions you want to discuss with your Clinical Pharmacist before your next visit? No Note additional concerns and questions from Patient.  Are there any health concerns that you feel we can do a better job addressing? No Note Patient's response.  Are you having any problems with any of the following since the last visit: (select all that apply)  None  Details:  12. Any falls since last visit? No  Details:  13. Any increased or uncontrolled pain since last visit? No  Details:  14. Next visit Type: office       Visit with:Megan Wynetta Emery        Date:12/10/21  Time:8:40am  15. Additional Details? No    Care Gaps: Colonoscopy-04/06/21 Diabetic Foot Exam-09/07/21 Ophthalmology-08/29/21 Dexa Scan - NA Annual Well Visit - 09/07/21 [place] Liter) Micro albumin-09/07/21 Hemoglobin A1c- 09/07/21  Star Rating Drugs: Atorvastatin 10 mg-last fill 09/07/21 90 ds, 06/18/21 90 ds Jardiance 25 mg-last fill 08/19/21 90 ds,05/11/21 90 ds Metformin 500 mg-last fill 09/07/21 90 ds, 07/01/21 90 ds  [place] [phone]

## 2021-11-20 ENCOUNTER — Other Ambulatory Visit: Payer: Self-pay | Admitting: Family Medicine

## 2021-11-20 NOTE — Telephone Encounter (Signed)
Refused Jardiance 25 mg because requesting refill much too soon.   09/07/2021 #90 with 1 refill was given.

## 2021-11-25 DIAGNOSIS — J430 Unilateral pulmonary emphysema [MacLeod's syndrome]: Secondary | ICD-10-CM | POA: Diagnosis not present

## 2021-12-05 ENCOUNTER — Other Ambulatory Visit: Payer: Self-pay | Admitting: Family Medicine

## 2021-12-05 NOTE — Telephone Encounter (Signed)
Requested Prescriptions  Pending Prescriptions Disp Refills  . montelukast (SINGULAIR) 10 MG tablet [Pharmacy Med Name: MONTELUKAST 10MG  TABLETS] 90 tablet 1    Sig: TAKE 1 TABLET(10 MG) BY MOUTH DAILY     Pulmonology:  Leukotriene Inhibitors Passed - 12/05/2021  8:04 AM      Passed - Valid encounter within last 12 months    Recent Outpatient Visits          2 months ago Routine general medical examination at a health care facility   [facility], [place] P, DO   7 months ago COPD exacerbation [facility])   [place], [place] P, DO   7 months ago COPD exacerbation ([place])   [place], [place] P, DO   7 months ago Sinus congestion   [place], [place] T, NP   8 months ago Skin lesion of back   [place], DO      Future Appointments            In 5 days Johnson, [name], DO [name]           . metoprolol tartrate (LOPRESSOR) 50 MG tablet [Pharmacy Med Name: METOPROLOL TARTRATE 50MG  TABLETS] 180 tablet 1    Sig: TAKE 1 TABLET BY MOUTH TWICE DAILY, TO BE TAKEN WITH 100 MG METOPROLOL SUCCINATE     Cardiovascular:  Beta Blockers Failed - 12/05/2021  8:04 AM      Failed - Last BP in normal range    BP Readings from Last 1 Encounters:  11/12/21 (!) 134/97         Passed - Last Heart Rate in normal range    Pulse Readings from Last 1 Encounters:  11/12/21 73         Passed - Valid encounter within last 6 months    Recent Outpatient Visits          2 months ago Routine general medical examination at a health care facility   [facility], [place] P, DO   7 months ago COPD exacerbation [facility])   [place], [name], DO   7 months ago COPD exacerbation [facility])   [place], [place] P, DO   7 months ago Sinus congestion   [place], [place] T, NP   8 months ago Skin lesion  of back   [employer], [place], DO      Future Appointments            In 5 days Wynetta Emery, Barb Merino, DO [employer], [name]

## 2021-12-10 ENCOUNTER — Encounter: Payer: Self-pay | Admitting: Family Medicine

## 2021-12-10 ENCOUNTER — Ambulatory Visit (INDEPENDENT_AMBULATORY_CARE_PROVIDER_SITE_OTHER): Payer: Medicare Other | Admitting: Family Medicine

## 2021-12-10 VITALS — BP 121/83 | HR 69 | Temp 97.9°F | Wt 228.2 lb

## 2021-12-10 DIAGNOSIS — N181 Chronic kidney disease, stage 1: Secondary | ICD-10-CM | POA: Diagnosis not present

## 2021-12-10 DIAGNOSIS — E1122 Type 2 diabetes mellitus with diabetic chronic kidney disease: Secondary | ICD-10-CM

## 2021-12-10 LAB — BAYER DCA HB A1C WAIVED: HB A1C (BAYER DCA - WAIVED): 7.9 % — ABNORMAL HIGH (ref 4.8–5.6)

## 2021-12-10 MED ORDER — RYBELSUS 7 MG PO TABS: 7.0000 mg | ORAL_TABLET | Freq: Every day | ORAL | 0 refills | Status: DC

## 2021-12-10 MED ORDER — RYBELSUS 3 MG PO TABS: 3.0000 mg | ORAL_TABLET | Freq: Every day | ORAL | 0 refills | Status: DC

## 2021-12-10 MED ORDER — BREZTRI AEROSPHERE 160-9-4.8 MCG/ACT IN AERO: 2.0000 | INHALATION_SPRAY | Freq: Two times a day (BID) | RESPIRATORY_TRACT | 6 refills | Status: DC

## 2021-12-10 NOTE — Progress Notes (Signed)
BP 121/83   Pulse 69   Temp 97.9 F (36.6 C)   Wt 228 lb 3.2 oz (103.5 kg)   SpO2 96%   BMI 32.74 kg/m    Subjective:    Patient ID: Kristopher [name], male    DOB: Mar 02, 1961, 61 y.o.   MRN: [id number]  HPI: Kristopher [name] is a 61 y.o. male  Chief Complaint  Patient presents with   Diabetes   DIABETES Hypoglycemic episodes:no Polydipsia/polyuria: no Visual disturbance: no Chest pain: no Paresthesias: no Glucose Monitoring: no  Accucheck frequency: Not Checking Taking Insulin?: no Blood Pressure Monitoring: not checking Retinal Examination: Up to Date Foot Exam: Up to Date Diabetic Education: Completed Pneumovax: Up to Date Influenza: Not up to Date Aspirin: yes  Relevant past medical, surgical, family and social history reviewed and updated as indicated. Interim medical history since our last visit reviewed. Allergies and medications reviewed and updated.  Review of Systems  Constitutional: Negative.   Respiratory: Negative.    Cardiovascular: Negative.   Musculoskeletal: Negative.   Neurological: Negative.   Psychiatric/Behavioral: Negative.      Per HPI unless specifically indicated above     Objective:    BP 121/83   Pulse 69   Temp 97.9 F (36.6 C)   Wt 228 lb 3.2 oz (103.5 kg)   SpO2 96%   BMI 32.74 kg/m   Wt Readings from Last 3 Encounters:  12/10/21 228 lb 3.2 oz (103.5 kg)  11/12/21 229 lb (103.9 kg)  11/08/21 228 lb (103.4 kg)    Physical Exam Vitals and nursing note reviewed.  Constitutional:      General: He is not in acute distress.    Appearance: Normal appearance. He is obese. He is not ill-appearing, toxic-appearing or diaphoretic.  HENT:     Head: Normocephalic and atraumatic.     Right Ear: External ear normal.     Left Ear: External ear normal.     Nose: Nose normal.     Mouth/Throat:     Mouth: Mucous membranes are moist.     Pharynx: Oropharynx is clear.  Eyes:     General: No scleral icterus.       Right eye:  No discharge.        Left eye: No discharge.     Extraocular Movements: Extraocular movements intact.     Conjunctiva/sclera: Conjunctivae normal.     Pupils: Pupils are equal, round, and reactive to light.  Cardiovascular:     Rate and Rhythm: Normal rate and regular rhythm.     Pulses: Normal pulses.     Heart sounds: Normal heart sounds. No murmur heard.    No friction rub. No gallop.  Pulmonary:     Effort: Pulmonary effort is normal. No respiratory distress.     Breath sounds: Normal breath sounds. No stridor. No wheezing, rhonchi or rales.  Chest:     Chest wall: No tenderness.  Musculoskeletal:        General: Normal range of motion.     Cervical back: Normal range of motion and neck supple.  Skin:    General: Skin is warm and dry.     Capillary Refill: Capillary refill takes less than 2 seconds.     Coloration: Skin is not jaundiced or pale.     Findings: No bruising, erythema, lesion or rash.  Neurological:     General: No focal deficit present.     Mental Status: He is alert and oriented to  person, place, and time. Mental status is at baseline.  Psychiatric:        Mood and Affect: Mood normal.        Behavior: Behavior normal.        Thought Content: Thought content normal.        Judgment: Judgment normal.     Results for orders placed or performed in visit on 09/07/21  Comprehensive metabolic panel  Result Value Ref Range   Glucose 204 (H) 70 - 99 mg/dL   BUN 8 6 - 24 mg/dL   Creatinine, Ser 0.87 0.76 - 1.27 mg/dL   eGFR 99 >59 mL/min/1.73   BUN/Creatinine Ratio 9 9 - 20   Sodium 140 134 - 144 mmol/L   Potassium 3.9 3.5 - 5.2 mmol/L   Chloride 101 96 - 106 mmol/L   CO2 24 20 - 29 mmol/L   Calcium 9.6 8.7 - 10.2 mg/dL   Total Protein 7.3 6.0 - 8.5 g/dL   Albumin 4.6 3.8 - 4.9 g/dL   Globulin, Total 2.7 1.5 - 4.5 g/dL   Albumin/Globulin Ratio 1.7 1.2 - 2.2   Bilirubin Total 0.3 0.0 - 1.2 mg/dL   Alkaline Phosphatase 167 (H) 44 - 121 IU/L   AST 41 (H) 0  - 40 IU/L   ALT 53 (H) 0 - 44 IU/L  CBC with Differential/Platelet  Result Value Ref Range   WBC 6.0 3.4 - 10.8 x10E3/uL   RBC 4.64 4.14 - 5.80 x10E6/uL   Hemoglobin 13.8 13.0 - 17.7 g/dL   Hematocrit 42.0 37.5 - 51.0 %   MCV 91 79 - 97 fL   MCH 29.7 26.6 - 33.0 pg   MCHC 32.9 31.5 - 35.7 g/dL   RDW 13.1 11.6 - 15.4 %   Platelets 169 150 - 450 x10E3/uL   Neutrophils 62 Not Estab. %   Lymphs 27 Not Estab. %   Monocytes 7 Not Estab. %   Eos 2 Not Estab. %   Basos 1 Not Estab. %   Neutrophils Absolute 3.7 1.4 - 7.0 x10E3/uL   Lymphocytes Absolute 1.6 0.7 - 3.1 x10E3/uL   Monocytes Absolute 0.4 0.1 - 0.9 x10E3/uL   EOS (ABSOLUTE) 0.1 0.0 - 0.4 x10E3/uL   Basophils Absolute 0.1 0.0 - 0.2 x10E3/uL   Immature Granulocytes 1 Not Estab. %   Immature Grans (Abs) 0.0 0.0 - 0.1 x10E3/uL  Bayer DCA Hb A1c Waived  Result Value Ref Range   HB A1C (BAYER DCA - WAIVED) 7.9 (H) 4.8 - 5.6 %  Lipid Panel w/o Chol/HDL Ratio  Result Value Ref Range   Cholesterol, Total 137 100 - 199 mg/dL   Triglycerides 268 (H) 0 - 149 mg/dL   HDL 34 (L) >39 mg/dL   VLDL Cholesterol Cal 43 (H) 5 - 40 mg/dL   LDL Chol Calc (NIH) 60 0 - 99 mg/dL  Microalbumin, Urine Waived  Result Value Ref Range   Microalb, Ur Waived 30 (H) 0 - 19 mg/L   Creatinine, Urine Waived 50 10 - 300 mg/dL   Microalb/Creat Ratio 30-300 (H) <30 mg/g  PSA  Result Value Ref Range   Prostate Specific Ag, Serum 0.5 0.0 - 4.0 ng/mL  TSH  Result Value Ref Range   TSH 1.710 0.450 - 4.500 uIU/mL      Assessment & Plan:   Problem List Items Addressed This Visit       Endocrine   Type 2 diabetes mellitus with stage 1 chronic kidney disease, without long-term  current use of insulin (HCC) - Primary    Stable with a1c of 7.9. Will add rybelsus and recheck 3 months. Call with any concerns. Continue to monitor.      Relevant Medications   Semaglutide (RYBELSUS) 3 MG TABS   Semaglutide (RYBELSUS) 7 MG TABS   Other Relevant Orders    Bayer DCA Hb A1c Waived     Follow up plan: Return in about 3 months (around 03/12/2022).

## 2021-12-10 NOTE — Assessment & Plan Note (Signed)
Stable with a1c of 7.9. Will add rybelsus and recheck 3 months. Call with any concerns. Continue to monitor.

## 2021-12-17 ENCOUNTER — Telehealth: Payer: Self-pay

## 2021-12-17 NOTE — Progress Notes (Signed)
Chronic Care Management Pharmacy Assistant   Name: Kristopher [name]  MRN: [id number] DOB: 04-Apr-1960   Reason for Encounter: Disease State-DM      Recent office visits:  12/30/21 [place] P, DO (Diabetes) Orders place: Labs; Medication changes: semaglutide 3mg  and 7 mg  Recent consult visits:  None sine the last coordination call  Hospital visits:  None in previous 6 months  Medications: Outpatient Encounter Medications as of 12/17/2021  Medication Sig Note   Accu-Chek FastClix Lancets MISC USE TO TEST BLOOD SUGAR 2X A DAY    albuterol (PROVENTIL) (2.5 MG/3ML) 0.083% nebulizer solution Take 3 mLs (2.5 mg total) by nebulization every 6 (six) hours as needed for wheezing or shortness of breath.    albuterol (VENTOLIN HFA) 108 (90 Base) MCG/ACT inhaler Inhale into the lungs.    amLODipine (NORVASC) 5 MG tablet TAKE 1 TABLET(5 MG) BY MOUTH TWICE DAILY    atorvastatin (LIPITOR) 10 MG tablet Take 1 tablet (10 mg total) by mouth daily at 6 PM.    BREZTRI AEROSPHERE 160-9-4.8 MCG/ACT AERO Inhale 2 puffs into the lungs 2 (two) times daily. Maintenance per pulmonology    diphenhydrAMINE (BENADRYL) 25 MG tablet Take 75 mg by mouth in the morning and at bedtime.    empagliflozin (JARDIANCE) 25 MG TABS tablet TAKE 1 TABLET(25 MG) BY MOUTH DAILY BEFORE BREAKFAST    esomeprazole (NEXIUM) 40 MG capsule TAKE 1 CAPSULE(40 MG) BY MOUTH DAILY    glucose blood (ACCU-CHEK GUIDE) test strip USE TO TEST BLOOD SUGAR 2X A DAY    hydrALAZINE (APRESOLINE) 100 MG tablet Take 1 tablet (100 mg total) by mouth 2 (two) times daily.    lidocaine (LIDODERM) 5 % UNWRAP AND APPLY 1 PATCH ONTO THE SKIN DAILY. REMOVE AND DISCARD PATCH WITHIN 12 HOURS OR AS DIRECTED BY MD    metFORMIN (GLUCOPHAGE) 500 MG tablet Take 2 tablets (1,000 mg total) by mouth 2 (two) times daily with a meal.    metoprolol succinate (TOPROL-XL) 100 MG 24 hr tablet TAKE 1 TABLET BY MOUTH TWICE DAILY WITH OR IMMEDIATELY FOLLOWING A MEAL;  TO BE TAKEN WITH 50 MG METOPROLOL TARTRATE    metoprolol tartrate (LOPRESSOR) 50 MG tablet TAKE 1 TABLET BY MOUTH TWICE DAILY, TO BE TAKEN WITH 100 MG METOPROLOL SUCCINATE    montelukast (SINGULAIR) 10 MG tablet TAKE 1 TABLET(10 MG) BY MOUTH DAILY    morphine (MSIR) 15 MG tablet Take 1 tablet (15 mg total) by mouth every 6 (six) hours as needed for moderate pain or severe pain. Must last 30 days.    morphine (MSIR) 15 MG tablet Take 1 tablet (15 mg total) by mouth every 6 (six) hours as needed for moderate pain or severe pain. Must last 30 days.    [START ON 01/14/2022] morphine (MSIR) 15 MG tablet Take 1 tablet (15 mg total) by mouth every 6 (six) hours as needed for moderate pain or severe pain. Must last 30 days. 11/12/2021: WARNING: Not a Duplicate. Future prescription. DO NOT DELETE during hospital medication reconciliation or at discharge. [facility] Patient    Multiple Vitamin (MULTIVITAMIN WITH MINERALS) TABS tablet Take 1 tablet by mouth daily.    NONFORMULARY OR COMPOUNDED ITEM Apply 1-2 mLs topically 4 (four) times daily as needed. 10% Ketamine/2% Cyclobenzaprine/6% Gabapentin Cream 08/13/2021: WARNING: Not a Duplicate. Future prescription. DO NOT DELETE during hospital medication reconciliation or at discharge. [facility] Patient    nortriptyline (PAMELOR) 25 MG capsule Take  2 capsules (50 mg total) by mouth at bedtime.    Potassium Chloride ER 20 MEQ TBCR Take 1 tablet by mouth daily.    Semaglutide (RYBELSUS) 3 MG TABS Take 3 mg by mouth daily.    Semaglutide (RYBELSUS) 7 MG TABS Take 7 mg by mouth daily.    STIOLTO RESPIMAT 2.5-2.5 MCG/ACT AERS Inhale 2 puffs into the lungs 4 (four) times daily as needed. Prn per pulmonology notes    SUMAtriptan (IMITREX) 50 MG tablet Take 1 tablet (50 mg total) by mouth every 2 (two) hours as needed for migraine. Take 1 tab at onset of migraine. May repeat in 2 hours if headache persists or recurs.     Facility-Administered Encounter Medications as of 12/17/2021  Medication   0.9 %  sodium chloride infusion   sodium chloride flush (NS) 0.9 % injection 10 mL   Recent Relevant Labs: Lab Results  Component Value Date/Time   HGBA1C 7.9 (H) 12/10/2021 08:23 AM   HGBA1C 7.9 (H) 09/07/2021 10:10 AM   MICROALBUR 30 (H) 09/07/2021 10:10 AM   MICROALBUR 10 03/08/2021 09:33 AM    Kidney Function Lab Results  Component Value Date/Time   CREATININE 0.87 09/07/2021 10:12 AM   CREATININE 0.77 07/05/2021 10:28 AM   GFRNONAA >60 07/05/2021 10:28 AM   GFRAA >60 12/01/2019 08:15 AM    Current antihyperglycemic regimen:  Jardiance 25 mg 1 tab daily Metformin 500 mg 2 tabs twice daily Rybelsus 3mg , 7 mg   What recent interventions/DTPs have been made to improve glycemic control:  Will add rybelsus and recheck 3 months.Per Park Liter 12/10/21  Have there been any recent hospitalizations or ED visits since last visit with CPP? No  Patient denies hypoglycemic symptoms, including None  Patient denies hyperglycemic symptoms, including none  How often are you checking your blood sugar? once daily  What are your blood sugars ranging? 100-180 Fasting: 120 this morning  During the week, how often does your blood glucose drop below 70? Never  Are you checking your feet daily/regularly? No issues with feet  Adherence Review: Is the patient currently on a STATIN medication? Yes Is the patient currently on ACE/ARB medication? No Does the patient have >5 day gap between last estimated fill dates? Yes   Care Gaps: Colonoscopy-04/06/21 Diabetic Foot Exam-09/07/21 Ophthalmology-08/29/21 Dexa Scan - NA Annual Well Visit - 09/07/21 Park Liter) Micro albumin-09/07/21 Hemoglobin A1c- 09/07/21 (7.9) and 12/10/21 (7.9)  Star Rating Drugs: Atorvastatin 10 mg-last fill 09/07/21 90 ds, 06/18/21 90 ds Jardiance 25 mg-last fill 11/21/21 90 ds, 08/19/21 90 ds Metformin 500 mg-last fill 12/03/21 90 ds, 09/07/21  90 ds   [place] [phone]

## 2021-12-25 DIAGNOSIS — J430 Unilateral pulmonary emphysema [MacLeod's syndrome]: Secondary | ICD-10-CM | POA: Diagnosis not present

## 2021-12-28 NOTE — Progress Notes (Signed)
Spoke with Kristopher [name] this morning, his blood sugar readings for this week were: 10/26 120,10/24 119,10/22 124,10/20 125. I reminded Kristopher [name] to try and write down reading everyday and that I will call him back next week.  [place] [phone]

## 2022-01-14 ENCOUNTER — Other Ambulatory Visit: Payer: Self-pay | Admitting: Pain Medicine

## 2022-01-14 DIAGNOSIS — Z79899 Other long term (current) drug therapy: Secondary | ICD-10-CM

## 2022-01-14 DIAGNOSIS — G8929 Other chronic pain: Secondary | ICD-10-CM

## 2022-01-14 DIAGNOSIS — C3492 Malignant neoplasm of unspecified part of left bronchus or lung: Secondary | ICD-10-CM

## 2022-01-14 DIAGNOSIS — G894 Chronic pain syndrome: Secondary | ICD-10-CM

## 2022-01-14 DIAGNOSIS — Z79891 Long term (current) use of opiate analgesic: Secondary | ICD-10-CM

## 2022-01-14 DIAGNOSIS — M961 Postlaminectomy syndrome, not elsewhere classified: Secondary | ICD-10-CM

## 2022-01-15 ENCOUNTER — Other Ambulatory Visit: Payer: Self-pay | Admitting: *Deleted

## 2022-01-15 ENCOUNTER — Telehealth: Payer: Self-pay | Admitting: Pain Medicine

## 2022-01-15 DIAGNOSIS — C3492 Malignant neoplasm of unspecified part of left bronchus or lung: Secondary | ICD-10-CM

## 2022-01-15 DIAGNOSIS — G8929 Other chronic pain: Secondary | ICD-10-CM

## 2022-01-15 DIAGNOSIS — G894 Chronic pain syndrome: Secondary | ICD-10-CM

## 2022-01-15 DIAGNOSIS — Z79899 Other long term (current) drug therapy: Secondary | ICD-10-CM

## 2022-01-15 DIAGNOSIS — M961 Postlaminectomy syndrome, not elsewhere classified: Secondary | ICD-10-CM

## 2022-01-15 DIAGNOSIS — Z79891 Long term (current) use of opiate analgesic: Secondary | ICD-10-CM

## 2022-01-15 MED ORDER — MORPHINE SULFATE 15 MG PO TABS: 15.0000 mg | ORAL_TABLET | Freq: Four times a day (QID) | ORAL | 0 refills | Status: DC | PRN

## 2022-01-15 NOTE — Telephone Encounter (Signed)
The patient called in wanting to know if this had been done. He needs his meds today. Please call him when its done so he can go get them.

## 2022-01-15 NOTE — Telephone Encounter (Signed)
Patient states pharmacy told him they do not have a script to fill this month. There should be a script to fill 01-14-22. Please call pharmacy and then let patient know.

## 2022-01-15 NOTE — Telephone Encounter (Signed)
Spoke with pharmacist, they told me that "the system cancelled the prescription". Rx request sent to Dr. Dossie Arbour.

## 2022-01-15 NOTE — Telephone Encounter (Signed)
Attempted to call patient, no answer, no voice mail

## 2022-01-25 DIAGNOSIS — J430 Unilateral pulmonary emphysema [MacLeod's syndrome]: Secondary | ICD-10-CM | POA: Diagnosis not present

## 2022-02-05 NOTE — Progress Notes (Unsigned)
PROVIDER NOTE: Information contained herein reflects review and annotations entered in association with encounter. Interpretation of such information and data should be left to medically-trained personnel. Information provided to patient can be located elsewhere in the medical record under "Patient Instructions". Document created using STT-dictation technology, any transcriptional errors that may result from process are unintentional.    Patient: Kristopher [name]  Service Category: E/M  Provider: Gaspar Cola, MD  DOB: 12/17/1960  DOS: 02/06/2022  Referring Provider: [name], DO  MRN: [id number]  Specialty: Interventional Pain Management  PCP: [name], DO  Type: Established Patient  Setting: Ambulatory outpatient    Location: Office  Delivery: Face-to-face     HPI  [name], a 61 y.o. year old male, is here today because of his No primary diagnosis found.. [name] primary complain today is No chief complaint on file. Last encounter: My last encounter with him was on 01/15/2022. Pertinent problems: [name] has Chronic low back pain (1ry area of Pain) (Bilateral) (L>R); Lumbar spondylosis; Chronic lumbar radicular pain (S1 dermatomal) (Left); 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); Chronic neck pain (posterior midline) (Bilateral) (L>R); Cervical spondylosis; Chronic cervical radicular pain (Bilateral) (C5/C6 dermatome) (L>R); Cervical spinal stenosis (C4-5); Cervical foraminal stenosis (Bilateral C5-6); Retrolisthesis of L5-S1; Cervical disc herniation (C4-5 and C5-6); Lumbar disc herniation (L5-S1); Chronic sacroiliac joint pain (Bilateral) (L>R); Chronic hip pain (Left); Lumbar facet syndrome (Bilateral) (L>R); Chronic lower extremity pain (2ry area of Pain) (Left); Greater occipital neuralgia (Right); Chronic pain syndrome; Migraine; Musculoskeletal pain, chronic; Left-sided weakness; Hip arthritis; S/P hip replacement; Squamous cell  carcinoma of left lung (HCC); and Chronic upper extremity pain (3ry area of Pain) (Bilateral) (L>R) on their pertinent problem list. Pain Assessment: Severity of   is reported as a  /10. Location:    / . Onset:  . Quality:  . Timing:  . Modifying factor(s):  [name] Vitals:  vitals were not taken for this visit.  BMI: Estimated body mass index is 32.74 kg/m as calculated from the following:   Height as of 11/12/21: _0  (1.778 m).   Weight as of 12/10/21: 228 lb 3.2 oz (103.5 kg).  Reason for encounter: medication management. ***  RTCB:  Compounding pharmacy honors 1 year scripts.  Sent on 07/31/2021, good until 07/31/2022.  Pharmacotherapy Assessment  Analgesic: Morphine IR 15 mg, 1 tablet p.o. every 6 hours (60 mg/day of morphine) (60 MME) + Compounded Specialty Analgesic Cream (w/ Ketamine) MME/day: 60 mg/day.   Monitoring: [place] PMP: PDMP reviewed during this encounter.       Pharmacotherapy: No side-effects or adverse reactions reported. Compliance: No problems identified. Effectiveness: Clinically acceptable.  No notes on file  No results found for: "CBDTHCR" No results found for: "D8THCCBX" No results found for: "D9THCCBX"  UDS:  Summary  Date Value Ref Range Status  05/14/2021 Note  Final    Comment:    ==================================================================== [employer] Select 13 (MW) ==================================================================== Test                             Result       Flag       Units  Drug Present and Declared for Prescription Verification   Morphine                       >[id number]       EXPECTED   ng/mg creat  Normorphine                    80           EXPECTED   ng/mg creat    Potential sources of large amounts of morphine in the absence of    codeine include administration of morphine or use of heroin.     Normorphine is an expected metabolite of morphine.  ==================================================================== Test                       Result    Flag   Units      Ref Range   Creatinine              81               mg/dL      >=20 ==================================================================== Declared Medications:  The flagging and interpretation on this report are based on the  following declared medications.  Unexpected results may arise from  inaccuracies in the declared medications.   **Note: The testing scope of this panel includes these medications:   Morphine (MSIR)   **Note: The testing scope of this panel does not include the  following reported medications:   Albuterol (Ventolin HFA)  Amlodipine (Norvasc)  Atorvastatin (Lipitor)  Budesonide (Breztri Aerosphere)  Diphenhydramine (Benadryl)  Empagliflozin (Jardiance)  Esomeprazole (Nexium)  Formoterol (Breztri Aerosphere)  Glycopyrrolate (Breztri Aerosphere)  Hydralazine (Apresoline)  Metformin (Glucophage)  Metoprolol (Toprol)  Montelukast (Singulair)  Multivitamin  Nortriptyline (Pamelor)  Olodaterol (Stiolto Respimat)  Potassium  Sumatriptan (Imitrex)  Tiotropium (Stiolto Respimat)  Topical Cyclobenzaprine  Topical Gabapentin  Topical Ketamine  Topical Lidocaine (Lidoderm) ==================================================================== For clinical consultation, please call [phone]. ====================================================================       ROS  Constitutional: Denies any fever or chills Gastrointestinal: No reported hemesis, hematochezia, vomiting, or acute GI distress Musculoskeletal: Denies any acute onset joint swelling, redness, loss of ROM, or weakness Neurological: No reported episodes of acute onset apraxia, aphasia, dysarthria, agnosia, amnesia, paralysis, loss of coordination, or loss of consciousness  Medication Review  Accu-Chek FastClix Lancets, Budeson-Glycopyrrol-Formoterol, NONFORMULARY OR COMPOUNDED ITEM, Potassium Chloride ER, SUMAtriptan, Semaglutide, Tiotropium  Bromide-Olodaterol, albuterol, amLODipine, atorvastatin, diphenhydrAMINE, empagliflozin, esomeprazole, glucose blood, hydrALAZINE, lidocaine, metFORMIN, metoprolol succinate, metoprolol tartrate, montelukast, morphine, multivitamin with minerals, and nortriptyline  History Review  Allergy: Mr. Vicuna is allergic to acetaminophen, aspirin, epinephrine, novocain [procaine], penicillins, strawberry extract, and shellfish allergy. Drug: Mr. Lobato  reports no history of drug use. Alcohol:  reports no history of alcohol use. Tobacco:  reports that he quit smoking about 3 years ago. His smoking use included cigarettes. He has a 8.75 pack-year smoking history. He has never used smokeless tobacco. Social: Mr. Guardia  reports that he quit smoking about 3 years ago. His smoking use included cigarettes. He has a 8.75 pack-year smoking history. He has never used smokeless tobacco. He reports that he does not drink alcohol and does not use drugs. Medical:  has a past medical history of Allergy, Arthritis, Benign hypertensive kidney disease, Chronic back pain, Diabetes mellitus, type 2 ([place]), Dyspnea, GERD (gastroesophageal reflux disease), Hypertension, Malignant neoplasm of lung ([place]), and Migraines. Surgical: Mr. Daisey  has a past surgical history that includes Back surgery; Appendectomy; Leg Surgery; Knee surgery (Left); Foot surgery (Left); Colonoscopy with propofol (N/A, 02/19/2016); polypectomy (N/A, 02/19/2016); Total hip arthroplasty (Left, 12/02/2017); Colonoscopy with propofol (N/A, 01/19/2018); polypectomy (01/19/2018); DG OPERATIVE LEFT HIP ([place] [facility]X); Joint replacement (Left, 12/2017); Video bronchoscopy (Left,  01/14/2019); Thoracotomy (Left, 01/14/2019); Flexible bronchoscopy (Bilateral, 01/20/2019); Flexible bronchoscopy (Bilateral, 01/22/2019); Electormagnetic navigation bronchoscopy (Left, 11/18/2018); Lung cancer surgery; Video bronchoscopy with endobronchial ultrasound (N/A, 10/15/2019); Video  bronchoscopy with endobronchial navigation (N/A, 10/15/2019); Portacath placement (N/A, 11/11/2019); Colonoscopy with propofol (N/A, 03/16/2021); and Colonoscopy with propofol (N/A, 04/06/2021). Family: family history includes Cancer in his father; Diabetes in his sister; Thrombosis in his sister.  Laboratory Chemistry Profile   Renal Lab Results  Component Value Date   BUN 8 09/07/2021   CREATININE 0.87 09/07/2021   BCR 9 09/07/2021   GFRAA >60 12/01/2019   GFRNONAA >60 07/05/2021    Hepatic Lab Results  Component Value Date   AST 41 (H) 09/07/2021   ALT 53 (H) 09/07/2021   ALBUMIN 4.6 09/07/2021   ALKPHOS 167 (H) 09/07/2021   LIPASE 50 02/17/2019    Electrolytes Lab Results  Component Value Date   NA 140 09/07/2021   K 3.9 09/07/2021   CL 101 09/07/2021   CALCIUM 9.6 09/07/2021   MG 2.4 01/25/2019   PHOS 3.5 01/23/2019    Bone No results found for: "VD25OH", "VD125OH2TOT", "VE9381OF7", "PZ0258NI7", "25OHVITD1", "25OHVITD2", "78EUMPNT6", "TESTOFREE", "TESTOSTERONE"  Inflammation (CRP: Acute Phase) (ESR: Chronic Phase) Lab Results  Component Value Date   CRP 0.7 04/03/2015   ESRSEDRATE 11 10/21/2017   LATICACIDVEN 1.7 01/16/2019         Note: Above Lab results reviewed.  Recent Imaging Review  CT CHEST W CONTRAST CLINICAL DATA:  Follow-up lung cancer. Status post left lower lobectomy.  EXAM: CT CHEST WITH CONTRAST  TECHNIQUE: Multidetector CT imaging of the chest was performed during intravenous contrast administration.  RADIATION DOSE REDUCTION: This exam was performed according to the departmental dose-optimization program which includes automated exposure control, adjustment of the mA and/or kV according to patient size and/or use of iterative reconstruction technique.  ***body onc***  CONTRAST:  29m OMNIPAQUE IOHEXOL 300 MG/ML  SOLN  COMPARISON:  07/03/2021  FINDINGS: Cardiovascular: Aortic atherosclerotic calcifications. Normal heart size. No  pericardial effusion.  Mediastinum/Nodes: No enlarged mediastinal, hilar, or axillary lymph nodes. Thyroid gland, trachea, and esophagus demonstrate no significant findings.  Lungs/Pleura: No pleural effusion identified. Status post left lower lobectomy. Again noted is increased left perihilar soft tissue with surrounding fibrotic changes which is presumed to represent changes secondary to external beam radiation. On today's study this area measures 5.4 cm in maximum AP dome in shin and 1.3 cm transverse dimension. On the previous exam this measured 6 cm in AP dimension and 1.5 cm transverse.  Subpleural nodule within the posterior right upper lobe is stable measuring 4 mm, image 38/3. Stable faint nodule in the posterior right upper lung measuring 2 mm, image 35/3.  Upper Abdomen: No acute abnormality. No adrenal mass identified. Calcified granulomas identified within the spleen.  Musculoskeletal: No chest wall abnormality. No acute or significant osseous findings.  IMPRESSION: 1. Status post left lower lobectomy. There is increased left perihilar soft tissue with surrounding fibrotic changes which is presumed to represent changes secondary to external beam radiation. This is stable when compared with the study from 07/03/2021. 2. Stable small nonspecific pulmonary nodules. 3. Aortic Atherosclerosis (ICD10-I70.0).  Electronically Signed   By: TKerby MoorsM.D.   On: 11/07/2021 06:23 Note: Reviewed        Physical Exam  General appearance: Well nourished, well developed, and well hydrated. In no apparent acute distress Mental status: Alert, oriented x 3 (person, place, & time)       Respiratory: No  evidence of acute respiratory distress Eyes: PERLA Vitals: There were no vitals taken for this visit. BMI: Estimated body mass index is 32.74 kg/m as calculated from the following:   Height as of 11/12/21: _0  (1.778 m).   Weight as of 12/10/21: 228 lb 3.2 oz (103.5  kg). Ideal: Patient weight not recorded  Assessment   Diagnosis Status  1. Chronic pain syndrome   2. Pharmacologic therapy   3. Encounter for medication management   4. 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)   5. Encounter for chronic pain management   6. Squamous cell carcinoma of left lung ([place])   7. Chronic lower extremity pain (2ry area of Pain) (Left)   8. Chronic upper extremity pain (3ry area of Pain) (Bilateral) (L>R)   9. Chronic use of opiate for therapeutic purpose   10. Chronic low back pain (1ry area of Pain) (Bilateral) (L>R)    Controlled Controlled Controlled   Updated Problems: No problems updated.  Plan of Care  Problem-specific:  No problem-specific Assessment & Plan notes found for this encounter.  Mr. PHIL MICHELS has a current medication list which includes the following long-term medication(s): albuterol, albuterol, amlodipine, atorvastatin, esomeprazole, hydralazine, metformin, metoprolol succinate, metoprolol tartrate, montelukast, morphine, nortriptyline, potassium chloride er, and sumatriptan.  Pharmacotherapy (Medications Ordered): No orders of the defined types were placed in this encounter.  Orders:  No orders of the defined types were placed in this encounter.  Follow-up plan:   No follow-ups on file.     Interventional Therapies  Risk  Complexity Considerations:   Estimated body mass index is 32.71 kg/m as calculated from the following:   Height as of this encounter: _1  (1.778 m).   Weight as of this encounter: 228 lb (103.4 kg). WNL   Planned  Pending:   None at this time. Claims to have anaphylactic allergy to Local Anesthetics. He was sent for testing and shown/proven not to be allergic to Lidocaine.    Under consideration:   None   Completed:   We have now called his compounding pharmacy twice, one in 2022 and on 08/13/2021 and they have confirmed that they do on her day 1 year refill  prescription for his nonformulary cream.  The last prescription was sent to them on 07/31/2021 and should be good until 07/31/2022.  They have confirmed on 08/13/2021 that they are in position of this prescription.   Therapeutic  Palliative (PRN) options:   None.      Recent Visits Date Type Provider Dept  11/12/21 Office Visit [name], MD [facility]  Showing recent visits within past 90 days and meeting all other requirements Future Appointments Date Type Provider Dept  02/06/22 Appointment [name], MD [facility]  Showing future appointments within next 90 days and meeting all other requirements  I discussed the assessment and treatment plan with the patient. The patient was provided an opportunity to ask questions and all were answered. The patient agreed with the plan and demonstrated an understanding of the instructions.  Patient advised to call back or seek an in-person evaluation if the symptoms or condition worsens.  Duration of encounter: *** minutes.  Total time on encounter, as per AMA guidelines included both the face-to-face and non-face-to-face time personally spent by the physician and/or other qualified health care professional(s) on the day of the encounter (includes time in activities that require the physician or other qualified health care professional and does not include time  in activities normally performed by clinical staff). Physician's time may include the following activities when performed: preparing to see the patient (eg, review of tests, pre-charting review of records) obtaining and/or reviewing separately obtained history performing a medically appropriate examination and/or evaluation counseling and educating the patient/family/caregiver ordering medications, tests, or procedures referring and communicating with other health care professionals (when not separately reported) documenting clinical information in the  electronic or other health record independently interpreting results (not separately reported) and communicating results to the patient/ family/caregiver care coordination (not separately reported)  Note by: Gaspar Cola, MD Date: 02/06/2022; Time: 5:10 PM

## 2022-02-06 ENCOUNTER — Encounter: Payer: Self-pay | Admitting: Pain Medicine

## 2022-02-06 ENCOUNTER — Ambulatory Visit: Payer: Medicare Other | Attending: Pain Medicine | Admitting: Pain Medicine

## 2022-02-06 VITALS — BP 135/90 | HR 76 | Temp 98.0°F | Resp 16 | Ht 70.0 in | Wt 228.0 lb

## 2022-02-06 DIAGNOSIS — M79602 Pain in left arm: Secondary | ICD-10-CM | POA: Insufficient documentation

## 2022-02-06 DIAGNOSIS — M961 Postlaminectomy syndrome, not elsewhere classified: Secondary | ICD-10-CM | POA: Insufficient documentation

## 2022-02-06 DIAGNOSIS — G8929 Other chronic pain: Secondary | ICD-10-CM | POA: Insufficient documentation

## 2022-02-06 DIAGNOSIS — Z79899 Other long term (current) drug therapy: Secondary | ICD-10-CM | POA: Diagnosis not present

## 2022-02-06 DIAGNOSIS — M5441 Lumbago with sciatica, right side: Secondary | ICD-10-CM | POA: Insufficient documentation

## 2022-02-06 DIAGNOSIS — C3492 Malignant neoplasm of unspecified part of left bronchus or lung: Secondary | ICD-10-CM | POA: Insufficient documentation

## 2022-02-06 DIAGNOSIS — M5442 Lumbago with sciatica, left side: Secondary | ICD-10-CM | POA: Diagnosis not present

## 2022-02-06 DIAGNOSIS — G894 Chronic pain syndrome: Secondary | ICD-10-CM | POA: Insufficient documentation

## 2022-02-06 DIAGNOSIS — Z79891 Long term (current) use of opiate analgesic: Secondary | ICD-10-CM | POA: Insufficient documentation

## 2022-02-06 DIAGNOSIS — M79605 Pain in left leg: Secondary | ICD-10-CM | POA: Insufficient documentation

## 2022-02-06 DIAGNOSIS — M79601 Pain in right arm: Secondary | ICD-10-CM | POA: Diagnosis not present

## 2022-02-06 MED ORDER — MORPHINE SULFATE 15 MG PO TABS: 15.0000 mg | ORAL_TABLET | Freq: Four times a day (QID) | ORAL | 0 refills | Status: DC | PRN

## 2022-02-06 MED ORDER — NALOXONE HCL 4 MG/0.1ML NA LIQD: 1.0000 | NASAL | 0 refills | Status: DC | PRN

## 2022-02-06 NOTE — Patient Instructions (Signed)
____________________________________________________________________________________________  Patient Information update  To: All of our patients.  Re: Name change.  It has been made official that our current name, "[facility]"   will soon be changed to "[facility]".   The purpose of this change is to eliminate any confusion created by the concept of our practice being a "Medication Management Pain Clinic". In the past this has led to the misconception that we treat pain primarily by the use of prescription medications.  Nothing can be farther from the truth.   Understanding PAIN MANAGEMENT: To further understand what our practice does, you first have to understand that "Pain Management" is a subspecialty that requires additional training once a physician has completed their specialty training, which can be in either Anesthesia, Neurology, Psychiatry, or Physical Medicine and Rehabilitation (PMR). Each one of these contributes to the final approach taken by each physician to the management of their patient's pain. To be a "Pain Management Specialist" you must have first completed one of the specialty trainings below.  Anesthesiologists - trained in clinical pharmacology and interventional techniques such as nerve blockade and regional as well as central neuroanatomy. They are trained to block pain before, during, and after surgical interventions.  Neurologists - trained in the diagnosis and pharmacological treatment of complex neurological conditions, such as Multiple Sclerosis, Parkinson's, spinal cord injuries, and other systemic conditions that may be associated with symptoms that may include but are not limited to pain. They tend to rely primarily on the treatment of chronic pain using prescription medications.  Psychiatrist - trained in conditions affecting the psychosocial  wellbeing of patients including but not limited to depression, anxiety, schizophrenia, personality disorders, addiction, and other substance use disorders that may be associated with chronic pain. They tend to rely primarily on the treatment of chronic pain using prescription medications.   Physical Medicine and Rehabilitation (PMR) physicians, also known as physiatrists - trained to treat a wide variety of medical conditions affecting the brain, spinal cord, nerves, bones, joints, ligaments, muscles, and tendons. Their training is primarily aimed at treating patients that have suffered injuries that have caused severe physical impairment. Their training is primarily aimed at the physical therapy and rehabilitation of those patients. They may also work alongside orthopedic surgeons or neurosurgeons using their expertise in assisting surgical patients to recover after their surgeries.  INTERVENTIONAL PAIN MANAGEMENT is sub-subspecialty of Pain Management.  Our physicians are Board-certified in Anesthesia, Pain Management, and Interventional Pain Management.  This meaning that not only have they been trained and Board-certified in their specialty of Anesthesia, and subspecialty of Pain Management, but they have also received further training in the sub-subspecialty of Interventional Pain Management, in order to become Board-certified as INTERVENTIONAL PAIN MANAGEMENT SPECIALIST.    Mission: Our goal is to use our skills in  INTERVENTIONAL PAIN MANAGEMENT as alternatives to the chronic use of prescription opioid medications for the treatment of pain. To make this more clear, we have changed our name to reflect what we do and offer. We will continue to offer medication management assessment and recommendations, but we will not be taking over any patient's medication management.  ____________________________________________________________________________________________      _______________________________________________________________________  Medication Rules  Purpose: To inform patients, and their family members, of our medication rules and regulations.  Applies to: All patients receiving prescriptions from our practice (written or electronic).  Pharmacy of record: This is the pharmacy where your electronic prescriptions   will be sent. Make sure we have the correct one.  Electronic prescriptions: In compliance with the [place] Strengthen Opioid Misuse Prevention (STOP) Act of 2017 (Session Law 2017-74/H243), effective March 04, 2018, all controlled substances must be electronically prescribed. Written prescriptions, faxing, or calling prescriptions to a pharmacy will no longer be done.  Prescription refills: These will be provided only during in-person appointments. No medications will be renewed without a "face-to-face" evaluation with your provider. Applies to all prescriptions.  NOTE: The following applies primarily to controlled substances (Opioid* Pain Medications).   Type of encounter (visit): For patients receiving controlled substances, face-to-face visits are required. (Not an option and not up to the patient.)  Patient's responsibilities: Pain Pills: Bring all pain pills to every appointment (except for procedure appointments). Pill Bottles: Bring pills in original pharmacy bottle. Bring bottle, even if empty. Always bring the bottle of the most recent fill.  Medication refills: You are responsible for knowing and keeping track of what medications you are taking and when is it that you will need a refill. The day before your appointment: write a list of all prescriptions that need to be refilled. The day of the appointment: give the list to the admitting nurse. Prescriptions will be written only during appointments. No prescriptions will be written on procedure days. If you forget a medication: it will not be "Called in", "Faxed", or  "electronically sent". You will need to get another appointment to get these prescribed. No early refills. Do not call asking to have your prescription filled early. Partial  or short prescriptions: Occasionally your pharmacy may not have enough pills to fill your prescription.  NEVER ACCEPT a partial fill or a prescription that is short of the total amount of pills that you were prescribed.  With controlled substances the law allows 72 hours for the pharmacy to complete the prescription.  If the prescription is not completed within 72 hours, the pharmacist will require a new prescription to be written. This means that you will be short on your medicine and we WILL NOT send another prescription to complete your original prescription.  Instead, request the pharmacy to send a carrier to a nearby branch to get enough medication to provide you with your full prescription. Prescription Accuracy: You are responsible for carefully inspecting your prescriptions before leaving our office. Have the discharge nurse carefully go over each prescription with you, before taking them home. Make sure that your name is accurately spelled, that your address is correct. Check the name and dose of your medication to make sure it is accurate. Check the number of pills, and the written instructions to make sure they are clear and accurate. Make sure that you are given enough medication to last until your next medication refill appointment. Taking Medication: Take medication as prescribed. When it comes to controlled substances, taking less pills or less frequently than prescribed is permitted and encouraged. Never take more pills than instructed. Never take the medication more frequently than prescribed.  Inform other Doctors: Always inform, all of your healthcare providers, of all the medications you take. Pain Medication from other Providers: You are not allowed to accept any additional pain medication from any other Doctor or  Healthcare provider. There are two exceptions to this rule. (see below) In the event that you require additional pain medication, you are responsible for notifying us, as stated below. Cough Medicine: Often these contain an opioid, such as codeine or hydrocodone. Never accept or take cough medicine containing   these opioids if you are already taking an opioid* medication. The combination may cause respiratory failure and death. Medication Agreement: You are responsible for carefully reading and following our Medication Agreement. This must be signed before receiving any prescriptions from our practice. Safely store a copy of your signed Agreement. Violations to the Agreement will result in no further prescriptions. (Additional copies of our Medication Agreement are available upon request.) Laws, Rules, & Regulations: All patients are expected to follow all Federal and State Laws, Statutes, Rules, & Regulations. Ignorance of the Laws does not constitute a valid excuse.  Illegal drugs and Controlled Substances: The use of illegal substances (including, but not limited to marijuana and its derivatives) and/or the illegal use of any controlled substances is strictly prohibited. Violation of this rule may result in the immediate and permanent discontinuation of any and all prescriptions being written by our practice. The use of any illegal substances is prohibited. Adopted CDC guidelines & recommendations: Target dosing levels will be at or below 60 MME/day. Use of benzodiazepines** is not recommended.  Exceptions: There are only two exceptions to the rule of not receiving pain medications from other Healthcare Providers. Exception #1 (Emergencies): In the event of an emergency (i.e.: accident requiring emergency care), you are allowed to receive additional pain medication. However, you are responsible for: As soon as you are able, call our office [phone], at any time of the day or night, and leave a  message stating your name, the date and nature of the emergency, and the name and dose of the medication prescribed. In the event that your call is answered by a member of our staff, make sure to document and save the date, time, and the name of the person that took your information.  Exception #2 (Planned Surgery): In the event that you are scheduled by another doctor or dentist to have any type of surgery or procedure, you are allowed (for a period no longer than 30 days), to receive additional pain medication, for the acute post-op pain. However, in this case, you are responsible for picking up a copy of our "Post-op Pain Management for Surgeons" handout, and giving it to your surgeon or dentist. This document is available at our office, and does not require an appointment to obtain it. Simply go to our office during business hours (Monday-Thursday from 8:00 AM to 4:00 PM) (Friday 8:00 AM to 12:00 Noon) or if you have a scheduled appointment with us, prior to your surgery, and ask for it by name. In addition, you are responsible for: calling our office [phone], at any time of the day or night, and leaving a message stating your name, name of your surgeon, type of surgery, and date of procedure or surgery. Failure to comply with your responsibilities may result in termination of therapy involving the controlled substances. Medication Agreement Violation. Following the above rules, including your responsibilities will help you in avoiding a Medication Agreement Violation ("Breaking your Pain Medication Contract").  Consequences:  Not following the above rules may result in permanent discontinuation of medication prescription therapy.  *Opioid medications include: morphine, codeine, oxycodone, oxymorphone, hydrocodone, hydromorphone, meperidine, tramadol, tapentadol, buprenorphine, fentanyl, methadone. **Benzodiazepine medications include: diazepam (Valium), alprazolam (Xanax), clonazepam (Klonopine),  lorazepam (Ativan), clorazepate (Tranxene), chlordiazepoxide (Librium), estazolam (Prosom), oxazepam (Serax), temazepam (Restoril), triazolam (Halcion) (Last updated: 12/25/2021) ______________________________________________________________________    ______________________________________________________________________  Medication Recommendations and Reminders  Applies to: All patients receiving prescriptions (written and/or electronic).  Medication Rules & Regulations: You are responsible   for reading, knowing, and following our "Medication Rules" document. These exist for your safety and that of others. They are not flexible and neither are we. Dismissing or ignoring them is an act of "non-compliance" that may result in complete and irreversible termination of such medication therapy. For safety reasons, "non-compliance" will not be tolerated. As with the [place] fundamental legal principle of "ignorance of the law is no defense", we will accept no excuses for not having read and knowing the content of documents provided to you by our practice.  Pharmacy of record:  Definition: This is the pharmacy where your electronic prescriptions will be sent.  We do not endorse any particular pharmacy. It is up to you and your insurance to decide what pharmacy to use.  We do not restrict you in your choice of pharmacy. However, once we write for your prescriptions, we will NOT be re-sending more prescriptions to fix restricted supply problems created by your pharmacy, or your insurance.  The pharmacy listed in the electronic medical record should be the one where you want electronic prescriptions to be sent. If you choose to change pharmacy, simply notify our nursing staff. Changes will be made only during your regular appointments and not over the phone.  Recommendations: Keep all of your pain medications in a safe place, under lock and key, even if you live alone. We will NOT replace lost, stolen, or  damaged medication. We do not accept "Police Reports" as proof of medications having been stolen. After you fill your prescription, take 1 week's worth of pills and put them away in a safe place. You should keep a separate, properly labeled bottle for this purpose. The remainder should be kept in the original bottle. Use this as your primary supply, until it runs out. Once it's gone, then you know that you have 1 week's worth of medicine, and it is time to come in for a prescription refill. If you do this correctly, it is unlikely that you will ever run out of medicine. To make sure that the above recommendation works, it is very important that you make sure your medication refill appointments are scheduled at least 1 week before you run out of medicine. To do this in an effective manner, make sure that you do not leave the office without scheduling your next medication management appointment. Always ask the nursing staff to show you in your prescription , when your medication will be running out. Then arrange for the receptionist to get you a return appointment, at least 7 days before you run out of medicine. Do not wait until you have 1 or 2 pills left, to come in. This is very poor planning and does not take into consideration that we may need to cancel appointments due to bad weather, sickness, or emergencies affecting our staff. DO NOT ACCEPT A "Partial Fill": If for any reason your pharmacy does not have enough pills/tablets to completely fill or refill your prescription, do not allow for a "partial fill". The law allows the pharmacy to complete that prescription within 72 hours, without requiring a new prescription. If they do not fill the rest of your prescription within those 72 hours, you will need a separate prescription to fill the remaining amount, which we will NOT provide. If the reason for the partial fill is your insurance, you will need to talk to the pharmacist about payment alternatives for  the remaining tablets, but again, DO NOT ACCEPT A PARTIAL FILL, unless you can trust   your pharmacist to obtain the remainder of the pills within 72 hours.  Prescription refills and/or changes in medication(s):  Prescription refills, and/or changes in dose or medication, will be conducted only during scheduled medication management appointments. (Applies to both, written and electronic prescriptions.) No refills on procedure days. No medication will be changed or started on procedure days. No changes, adjustments, and/or refills will be conducted on a procedure day. Doing so will interfere with the diagnostic portion of the procedure. No phone refills. No medications will be "called into the pharmacy". No Fax refills. No weekend refills. No Holliday refills. No after hours refills.  Remember:  Business hours are:  Monday to Thursday 8:00 AM to 4:00 PM Provider's Schedule: Ryshawn Sanzone, MD - Appointments are:  Medication management: Monday and Wednesday 8:00 AM to 4:00 PM Procedure day: Tuesday and Thursday 7:30 AM to 4:00 PM Bilal Lateef, MD - Appointments are:  Medication management: Tuesday and Thursday 8:00 AM to 4:00 PM Procedure day: Monday and Wednesday 7:30 AM to 4:00 PM (Last update: 12/25/2021) ______________________________________________________________________    ____________________________________________________________________________________________  Pharmacy Shortages of Pain Medication   Introduction Shockingly as it may seem, .  "No U.[place] Supreme Court decision has ever interpreted the Constitution as guaranteeing a right to health care for all Americans." - https://www.healthequityandpolicylab.com/elusive-right-to-health-care-under-us-law  "With respect to human rights, the [place] has no formally codified right to health, nor does it participate in a human rights treaty that specifies a right to health." - Scott J. Schweikart, JD,  MBE  Situation By now, most of our patients have had the experience of being told by their pharmacist that they do not have enough medication to cover their prescription. If you have not had this experience, just know that you soon will.  Problem There appears to be a shortage of these medications, either at the national level or locally. This is happening with all pharmacies. When there is not enough medication, patients are offered a partial fill and they are told that they will try to get the rest of the medicine for them at a later time. If they do not have enough for even a partial fill, the pharmacists are telling the patients to call us (the prescribing physicians) to request that we send another prescription to another pharmacy to get the medicine.   This reordering of a controlled substance creates documentation problems where additional paperwork needs to be created to explain why two prescriptions for the same period of time and the same medicine are being prescribed to the same patient. It also creates situations where the last appointment note does not accurately reflect when and what prescriptions were given to a patient. This leads to prescribing errors down the line, in subsequent follow-up visits.   [place] Board [employer] Pharmacy ([place]BOP) Research revealed that Board of Pharmacy Rule .1806 (21 NCAC 46.1806) authorizes pharmacists to the transfer of prescriptions among pharmacies, and it sets forth procedural and recordkeeping requirements for doing so. However, this requires the pharmacist to complete the previously mentioned procedural paperwork to accomplish the transfer. As it turns out, it is much easier for them to have the prescribing physicians do the work.   Possible solutions 1. You can ask your physician to assist you in weaning yourself off these medications. 2. Ask your pharmacy if the medication is in stock, 3 days prior to your refill. 3. If you need a pharmacy change,  let us know at your medication management visit. Prescriptions that have already been   electronically sent to a pharmacy will not be re-sent to a different pharmacy if your pharmacy of record does not have it in stock. Proper stocking of medication is a pharmacy problem, not a prescriber problem. Work with your pharmacist to solve the problem. 4. Have the [place] Assembly add a provision to the "STOP ACT" (the law that mandates how controlled substances are prescribed) where there is an exception to the electronic prescribing rule that states that in the event there are shortages of medications the physicians are allowed to use written prescriptions as opposed to electronic ones. This would allow patients to take their prescriptions to a different pharmacy that may have enough medication available to fill the prescription. The problem is that currently there is a law that does not allow for written prescriptions, with the exception of instances where the electronic medical record is down due to technical issues.  5. Have [place] Congress ease the pressure on pharmaceutical companies, allowing them to produce enough quantities of the medication to adequately supply the population. 6. Have pharmacies keep enough stocks of these medications to cover their client base.  7. Have the [place] Assembly add a provision to the "STOP ACT" where they ease the regulations surrounding the transfer of controlled substances between pharmacies, so as to simplify the transfer of supplies. As an alternative, develop a system to allow patients to obtain the remainder of their prescription at another one of their pharmacies or at an associate pharmacy.   How this shortage will affect you.  Understand that this is a pharmacy supply problem, not a prescriber problem. Work with your pharmacy to solve it. The job of the prescriber is to evaluate and monitor the patient for the appropriate indications and use of  these medicines. It is not the job of the prescriber to supply the medication or to solve problems with that supply. The responsibility and the choice to obtain the medication resides on the patient. By law, supplying the medication is the job of the pharmacy. It is certainly not the job of the prescriber to solve supply problems.   Due to the above problems we are no longer taking patients to write for their pain medication. Future discussions with your physician may include potentially weaning medications or transitioning to alternatives.  We will be focusing primarily on interventional based pain management. We will continue to evaluate for appropriate indications and we may provide recommendations regarding medication, dose, and schedule, as well as monitoring recommendations, however, we will not be taking over the actual prescribing of these substances. On those patients where we are treating their chronic pain with interventional therapies, exceptions will be considered on a case by case basis. At this time, we will try to continue providing this supplemental service to those patients we have been managing in the past. However, as of August 1st, 2023, we no longer will be sending additional prescriptions to other pharmacies for the purpose of solving their supply problems. Once we send a prescription to a pharmacy, we will not be resending it again to another pharmacy to cover for their shortages.   What to do. Write as many letters as you can. Recruit the help of family members in writing these letters. Below are some of the places where you can write to make your voice heard. Let them know what the problem is and push them to look for solutions.   Search internet for: "[place] find your legislators" https://www.ncleg.gov/findyourlegislators  Search   internet for: "[place] insurance commissioner  complaints" https://www.ncdoi.gov/contactscomplaints/assistance-or-file-complaint  Search internet for: "[place] Board of Pharmacy complaints" http://www.ncbop.org/contact.htm  Search internet for: "[facility] pharmacy complaints" Email [facility] Customer Relations https://www.[facility].com/help/email-customer-relations.jsp?callType=store  Search internet for: "[facility] pharmacy customer service complaints" https://www.[facility].com/topic/marketing/contactus/contactus_customerservice.jsp  ____________________________________________________________________________________________     ____________________________________________________________________________________________  Drug Holidays (Slow)  What is a "Drug Holiday"? Drug Holiday: is the name given to the period of time during which a patient stops taking a medication(s) for the purpose of eliminating tolerance to the drug.  Benefits Improved effectiveness of opioid medication. Decreased opioid dose needed to achieve benefits. Improved pain with lesser dose. Ending dependence on high dose therapy. Possible decrease in cost of therapy. It may uncover "opioid-induced hyperalgesia". (OIH)  What is "opioid hyperalgesia"? It is an increased paradoxical pain sensitization state caused by exposure to opioids, whereby a patient receiving opioids for the treatment of pain could actually become more sensitive to a painful stimuli. Stopping the opioid pain medication, contrary to the expected increase in the pain, it actually decreases or completely eliminates the pain. Ref.: "A comprehensive review of opioid-induced hyperalgesia". Marion Lee, et.al. Pain Physician. 2011 Mar-Apr;14(2):145-61.  What is tolerance? Tolerance: is the progressive decreased in effectiveness of a drug due to its repetitive use. With repetitive use, the body gets use to the medication and as a consequence, it loses its effectiveness. This is a common problem  seen with opioid pain medications. As a result, a larger dose of the drug is needed to achieve the same effect that used to be obtained with a smaller dose.  How long should a "Drug Holiday" last? Effectiveness depends on the patient staying off all opioid pain medicines for a minimum of 14 consecutive days. (2 weeks)  How about just taking less of the medicine? Does not work. This will not eliminate the excess receptors.  How about switching to a different pain medicine? (AKA. "Opioid rotation") This "technique" was promoted by studies funded by pharmaceutical companies, such as [employer], creators of "OxyContin". It does not work. It only creates the illusion of effectiveness by taking advantage of inaccurate equivalent dose calculations between different opioid medications.   Should I stop the medicine "cold turkey"? No. You should always coordinate with your Pain Specialist so that he/she can provide you with the correct medication dose to make the transition as smoothly as possible.  How do I stop the medicine? Slowly. You will be instructed to decrease the daily amount of pills that you take by one (1) pill every seven (7) days. This is called a "slow downward taper" of your dose. For example: if you normally take four (4) pills per day, you will be asked to drop this dose to three (3) pills per day for seven (7) days, then to two (2) pills per day for seven (7) days, then to one (1) per day for seven (7) days, and at the end of those last seven (7) days, this is when the "Drug Holiday" would start.   How about withdrawals? Typically, what triggers withdrawals is the sudden stop of a high dose opioid medication. Withdrawals can usually be avoided by slowly decreasing the dose over a prolonged period of time. If you attempt to stop your medication abruptly, withdrawals may be possible.  What are withdrawals? Withdrawals: refers to the wide range of symptoms that occur after stopping or  dramatically reducing opiate drugs after heavy and prolonged use. Withdrawal symptoms do not occur to patients that use low dose opioids,   or those who take the medication sporadically. Contrary to benzodiazepine (example: Valium, Xanax, etc.) or alcohol withdrawals ("Delirium Tremens"), opioid withdrawals are not lethal. Withdrawals are the physical manifestation of the body getting rid of the excess receptors.  Withdrawal Symptoms Early symptoms of withdrawal may include: Agitation Anxiety Muscle aches Increased tearing Insomnia Runny nose Sweating Yawning  Late symptoms of withdrawal may include: Abdominal cramping Diarrhea Dilated pupils Goose bumps Nausea Vomiting  Will I experience withdrawals? Due to the slow nature of the taper, it is very unlikely.  What is a slow taper? Taper: refers to the gradual decrease in dose.  (Last update: 01/28/2022) ____________________________________________________________________________________________    ____________________________________________________________________________________________  CBD (cannabidiol) & Delta-8 (Delta-8 tetrahydrocannabinol) WARNING  Intro: Cannabidiol (CBD) and tetrahydrocannabinol (THC), are two natural compounds found in plants of the Cannabis genus. They can both be extracted from hemp or cannabis. Hemp and cannabis come from the Cannabis sativa plant. Both compounds interact with your body's endocannabinoid system, but they have very different effects. CBD does not produce the high sensation associated with cannabis. Delta-8 tetrahydrocannabinol, also known as delta-8 THC, is a psychoactive substance found in the Cannabis sativa plant, of which marijuana and hemp are two varieties. THC is responsible for the high associated with the illicit use of marijuana.  Applicable to: All individuals currently taking or considering taking CBD (cannabidiol) and, more important, all patients taking opioid analgesic  controlled substances (pain medication). (Example: oxycodone; oxymorphone; hydrocodone; hydromorphone; morphine; methadone; tramadol; tapentadol; fentanyl; buprenorphine; butorphanol; dextromethorphan; meperidine; codeine; etc.)  Legal status: CBD remains a Schedule I drug prohibited for any use. CBD is illegal with one exception. In the [place], CBD has a limited Food and Drug Administration (FDA) approval for the treatment of two specific types of epilepsy disorders. Only one CBD product has been approved by the FDA for this purpose: "Epidiolex". FDA is aware that some companies are marketing products containing cannabis and cannabis-derived compounds in ways that violate the Federal Food, Drug and Cosmetic Act (FD&C Act) and that may put the health and safety of consumers at risk. The FDA, a Federal agency, has not enforced the CBD status since 2018. UPDATE: (04/20/2021) The Drug Enforcement Agency (DEA) issued a letter stating that "delta" cannabinoids, including Delta-8-THCO and Delta-9-THCO, synthetically derived from hemp do not qualify as hemp and will be viewed as Schedule I drugs. (Schedule I drugs, substances, or chemicals are defined as drugs with no currently accepted medical use and a high potential for abuse. Some examples of Schedule I drugs are: heroin, lysergic acid diethylamide (LSD), marijuana (cannabis), 3,4-methylenedioxymethamphetamine (ecstasy), methaqualone, and peyote.) (https://www.dea.gov)  Legality: Some manufacturers ship CBD products nationally, which is illegal. Often such products are sold online and are therefore available throughout the country. CBD is openly sold in head shops and health food stores in some states where such sales have not been explicitly legalized. Selling unapproved products with unsubstantiated therapeutic claims is not only a violation of the law, but also can put patients at risk, as these products have not been proven to be safe or effective.  Federal illegality makes it difficult to conduct research on CBD.  Reference: "FDA Regulation of Cannabis and Cannabis-Derived Products, Including Cannabidiol (CBD)" - https://www.fda.gov/news-events/public-health-focus/fda-regulation-cannabis-and-cannabis-derived-products-including-cannabidiol-cbd  Warning: CBD is not FDA approved and has not undergo the same manufacturing controls as prescription drugs.  This means that the purity and safety of available CBD may be questionable. Most of the time, despite manufacturer's claims, it is contaminated with THC (delta-9-tetrahydrocannabinol - the chemical   in marijuana responsible for the "HIGH").  When this is the case, the THC contaminant will trigger a positive urine drug screen (UDS) test for Marijuana (carboxy-THC). Because a positive UDS for any illicit substance is a violation of our medication agreement, your opioid analgesics (pain medicine) may be permanently discontinued. The FDA recently put out a warning about 5 things that everyone should be aware of regarding Delta-8 THC: Delta-8 THC products have not been evaluated or approved by the FDA for safe use and may be marketed in ways that put the public health at risk. The FDA has received adverse event reports involving delta-8 THC-containing products. Delta-8 THC has psychoactive and intoxicating effects. Delta-8 THC manufacturing often involve use of potentially harmful chemicals to create the concentrations of delta-8 THC claimed in the marketplace. The final delta-8 THC product may have potentially harmful by-products (contaminants) due to the chemicals used in the process. Manufacturing of delta-8 THC products may occur in uncontrolled or unsanitary settings, which may lead to the presence of unsafe contaminants or other potentially harmful substances. Delta-8 THC products should be kept out of the reach of children and pets.  MORE ABOUT CBD  General Information: CBD was discovered in 1940  and it is a derivative of the cannabis sativa genus plants (Marijuana and Hemp). It is one of the 113 identified substances found in Marijuana. It accounts for up to 40% of the plant's extract. As of 2018, preliminary clinical studies on CBD included research for the treatment of anxiety, movement disorders, and pain. CBD is available and consumed in multiple forms, including inhalation of smoke or vapor, as an aerosol spray, and by mouth. It may be supplied as an oil containing CBD, capsules, dried cannabis, or as a liquid solution. CBD is thought not to be as psychoactive as THC (delta-9-tetrahydrocannabinol - the chemical in marijuana responsible for the "HIGH"). Studies suggest that CBD may interact with different biological target receptors in the body, including cannabinoid and other neurotransmitter receptors. As of 2018 the mechanism of action for its biological effects has not been determined.  Side-effects  Adverse reactions: Dry mouth, diarrhea, decreased appetite, fatigue, drowsiness, malaise, weakness, sleep disturbances, and others.  Drug interactions: CBC may interact with other medications such as blood-thinners. Because CBD causes drowsiness on its own, it also increases the drowsiness caused by other medications, including antihistamines (such as Benadryl), benzodiazepines (Xanax, Ativan, Valium), antipsychotics, antidepressants and opioids, as well as alcohol and supplements such as kava, melatonin and St. John's Wort. Be cautious with the following combinations:   Brivaracetam (Briviact) Brivaracetam is changed and broken down by the body. CBD might decrease how quickly the body breaks down brivaracetam. This might increase levels of brivaracetam in the body.  Caffeine Caffeine is changed and broken down by the body. CBD might decrease how quickly the body breaks down caffeine. This might increase levels of caffeine in the body.  Carbamazepine (Tegretol) Carbamazepine is changed and  broken down by the body. CBD might decrease how quickly the body breaks down carbamazepine. This might increase levels of carbamazepine in the body and increase its side effects.  Citalopram (Celexa) Citalopram is changed and broken down by the body. CBD might decrease how quickly the body breaks down citalopram. This might increase levels of citalopram in the body and increase its side effects.  Clobazam (Onfi) Clobazam is changed and broken down by the liver. CBD might decrease how quickly the liver breaks down clobazam. This might increase the effects and side effects   of clobazam.  Eslicarbazepine (Aptiom) Eslicarbazepine is changed and broken down by the body. CBD might decrease how quickly the body breaks down eslicarbazepine. This might increase levels of eslicarbazepine in the body by a small amount.  Everolimus (Zostress) Everolimus is changed and broken down by the body. CBD might decrease how quickly the body breaks down everolimus. This might increase levels of everolimus in the body.  Lithium Taking higher doses of CBD might increase levels of lithium. This can increase the risk of lithium toxicity.  Medications changed by the liver (Cytochrome P450 1A1 (CYP1A1) substrates) Some medications are changed and broken down by the liver. CBD might change how quickly the liver breaks down these medications. This could change the effects and side effects of these medications.  Medications changed by the liver (Cytochrome P450 1A2 (CYP1A2) substrates) Some medications are changed and broken down by the liver. CBD might change how quickly the liver breaks down these medications. This could change the effects and side effects of these medications.  Medications changed by the liver (Cytochrome P450 1B1 (CYP1B1) substrates) Some medications are changed and broken down by the liver. CBD might change how quickly the liver breaks down these medications. This could change the effects and side  effects of these medications.  Medications changed by the liver (Cytochrome P450 2A6 (CYP2A6) substrates) Some medications are changed and broken down by the liver. CBD might change how quickly the liver breaks down these medications. This could change the effects and side effects of these medications.  Medications changed by the liver (Cytochrome P450 2B6 (CYP2B6) substrates) Some medications are changed and broken down by the liver. CBD might change how quickly the liver breaks down these medications. This could change the effects and side effects of these medications.  Medications changed by the liver (Cytochrome P450 2C19 (CYP2C19) substrates) Some medications are changed and broken down by the liver. CBD might change how quickly the liver breaks down these medications. This could change the effects and side effects of these medications.  Medications changed by the liver (Cytochrome P450 2C8 (CYP2C8) substrates) Some medications are changed and broken down by the liver. CBD might change how quickly the liver breaks down these medications. This could change the effects and side effects of these medications.  Medications changed by the liver (Cytochrome P450 2C9 (CYP2C9) substrates) Some medications are changed and broken down by the liver. CBD might change how quickly the liver breaks down these medications. This could change the effects and side effects of these medications.  Medications changed by the liver (Cytochrome P450 2D6 (CYP2D6) substrates) Some medications are changed and broken down by the liver. CBD might change how quickly the liver breaks down these medications. This could change the effects and side effects of these medications.  Medications changed by the liver (Cytochrome P450 2E1 (CYP2E1) substrates) Some medications are changed and broken down by the liver. CBD might change how quickly the liver breaks down these medications. This could change the effects and side effects  of these medications.  Medications changed by the liver (Cytochrome P450 3A4 (CYP3A4) substrates) Some medications are changed and broken down by the liver. CBD might change how quickly the liver breaks down these medications. This could change the effects and side effects of these medications.  Medications changed by the liver (Glucuronidated drugs) Some medications are changed and broken down by the liver. CBD might change how quickly the liver breaks down these medications. This could change the effects and side effects   of these medications.  Medications that decrease the breakdown of other medications by the liver (Cytochrome P450 2C19 (CYP2C19) inhibitors) CBD is changed and broken down by the liver. Some drugs decrease how quickly the liver changes and breaks down CBD. This could change the effects and side effects of CBD.  Medications that decrease the breakdown of other medications in the liver (Cytochrome P450 3A4 (CYP3A4) inhibitors) CBD is changed and broken down by the liver. Some drugs decrease how quickly the liver changes and breaks down CBD. This could change the effects and side effects of CBD.  Medications that increase breakdown of other medications by the liver (Cytochrome P450 3A4 (CYP3A4) inducers) CBD is changed and broken down by the liver. Some drugs increase how quickly the liver changes and breaks down CBD. This could change the effects and side effects of CBD.  Medications that increase the breakdown of other medications by the liver (Cytochrome P450 2C19 (CYP2C19) inducers) CBD is changed and broken down by the liver. Some drugs increase how quickly the liver changes and breaks down CBD. This could change the effects and side effects of CBD.  Methadone (Dolophine) Methadone is broken down by the liver. CBD might decrease how quickly the liver breaks down methadone. Taking cannabidiol along with methadone might increase the effects and side effects of  methadone.  Rufinamide (Banzel) Rufinamide is changed and broken down by the body. CBD might decrease how quickly the body breaks down rufinamide. This might increase levels of rufinamide in the body by a small amount.  Sedative medications (CNS depressants) CBD might cause sleepiness and slowed breathing. Some medications, called sedatives, can also cause sleepiness and slowed breathing. Taking CBD with sedative medications might cause breathing problems and/or too much sleepiness.  Sirolimus (Rapamune) Sirolimus is changed and broken down by the body. CBD might decrease how quickly the body breaks down sirolimus. This might increase levels of sirolimus in the body.  Stiripentol (Diacomit) Stiripentol is changed and broken down by the body. CBD might decrease how quickly the body breaks down stiripentol. This might increase levels of stiripentol in the body and increase its side effects.  Tacrolimus (Prograf) Tacrolimus is changed and broken down by the body. CBD might decrease how quickly the body breaks down tacrolimus. This might increase levels of tacrolimus in the body.  Tamoxifen (Soltamox) Tamoxifen is changed and broken down by the body. CBD might affect how quickly the body breaks down tamoxifen. This might affect levels of tamoxifen in the body.  Topiramate (Topamax) Topiramate is changed and broken down by the body. CBD might decrease how quickly the body breaks down topiramate. This might increase levels of topiramate in the body by a small amount.  Valproate Valproic acid can cause liver injury. Taking cannabidiol with valproic acid might increase the chance of liver injury. CBD and/or valproic acid might need to be stopped, or the dose might need to be reduced.  Warfarin (Coumadin) CBD might increase levels of warfarin, which can increase the risk for bleeding. CBD and/or warfarin might need to be stopped, or the dose might need to be reduced.  Zonisamide Zonisamide is  changed and broken down by the body. CBD might decrease how quickly the body breaks down zonisamide. This might increase levels of zonisamide in the body by a small amount. (Last update: 05/02/2021) ____________________________________________________________________________________________   ____________________________________________________________________________________________  Naloxone Nasal Spray  Why am I receiving this medication? [place] STOP ACT requires that all patients taking high dose opioids or at   risk of opioids respiratory depression, be prescribed an opioid reversal agent, such as Naloxone (AKA: Narcan).  What is this medication? NALOXONE (nal OX one) treats opioid overdose, which causes slow or shallow breathing, severe drowsiness, or trouble staying awake. Call emergency services after using this medication. You may need additional treatment. Naloxone works by reversing the effects of opioids. It belongs to a group of medications called opioid blockers.  COMMON BRAND NAME(S): Kloxxado, Narcan  What should I tell my care team before I take this medication? They need to know if you have any of these conditions: Heart disease Substance use disorder An unusual or allergic reaction to naloxone, other medications, foods, dyes, or preservatives Pregnant or trying to get pregnant Breast-feeding  When to use this medication? This medication is to be used for the treatment of respiratory depression (less than 8 breaths per minute) secondary to opioid overdose.   How to use this medication? This medication is for use in the nose. Lay the person on their back. Support their neck with your hand and allow the head to tilt back before giving the medication. The nasal spray should be given into 1 nostril. After giving the medication, move the person onto their side. Do not remove or test the nasal spray until ready to use. Get emergency medical help right away after giving  the first dose of this medication, even if the person wakes up. You should be familiar with how to recognize the signs and symptoms of a narcotic overdose. If more doses are needed, give the additional dose in the other nostril. Talk to your care team about the use of this medication in children. While this medication may be prescribed for children as young as newborns for selected conditions, precautions do apply.  Naloxone Overdosage: If you think you have taken too much of this medicine contact a poison control center or emergency room at once.  NOTE: This medicine is only for you. Do not share this medicine with others.  What if I miss a dose? This does not apply.  What may interact with this medication? This is only used during an emergency. No interactions are expected during emergency use. This list may not describe all possible interactions. Give your health care provider a list of all the medicines, herbs, non-prescription drugs, or dietary supplements you use. Also tell them if you smoke, drink alcohol, or use illegal drugs. Some items may interact with your medicine.  What should I watch for while using this medication? Keep this medication ready for use in the case of an opioid overdose. Make sure that you have the phone number of your care team and local hospital ready. You may need to have additional doses of this medication. Each nasal spray contains a single dose. Some emergencies may require additional doses. After use, bring the treated person to the nearest hospital or call 911. Make sure the treating care team knows that the person has received a dose of this medication. You will receive additional instructions on what to do during and after use of this medication before an emergency occurs.  What side effects may I notice from receiving this medication? Side effects that you should report to your care team as soon as possible: Allergic reactions--skin rash, itching, hives,  swelling of the face, lips, tongue, or throat Side effects that usually do not require medical attention (report these to your care team if they continue or are bothersome): Constipation Dryness or irritation inside the nose Headache   Increase in blood pressure Muscle spasms Stuffy nose Toothache This list may not describe all possible side effects. Call your doctor for medical advice about side effects. You may report side effects to FDA at 1-800-FDA-1088.  Where should I keep my medication? Because this is an emergency medication, you should keep it with you at all times.  Keep out of the reach of children and pets. Store between 20 and 25 degrees C (68 and 77 degrees F). Do not freeze. Throw away any unused medication after the expiration date. Keep in original box until ready to use.  NOTE: This sheet is a summary. It may not cover all possible information. If you have questions about this medicine, talk to your doctor, pharmacist, or health care provider.   2023 [employer]vier/Gold Standard (2020-10-27 00:00:00)  ____________________________________________________________________________________________   

## 2022-02-06 NOTE — Progress Notes (Signed)
Nursing Pain Medication Assessment:  Safety precautions to be maintained throughout the outpatient stay will include: orient to surroundings, keep bed in low position, maintain call bell within reach at all times, provide assistance with transfer out of bed and ambulation.  Medication Inspection Compliance: Pill count conducted under aseptic conditions, in front of the patient. Neither the pills nor the bottle was removed from the patient's sight at any time. Once count was completed pills were immediately returned to the patient in their original bottle.  Medication: Morphine IR Pill/Patch Count:  35 of 120 pills remain Pill/Patch Appearance: Markings consistent with prescribed medication Bottle Appearance: Standard pharmacy container. Clearly labeled. Filled Date: 53 / 13 / 2023 Last Medication intake:  Today

## 2022-02-14 ENCOUNTER — Telehealth: Payer: Self-pay | Admitting: Family Medicine

## 2022-02-18 ENCOUNTER — Telehealth: Payer: Self-pay | Admitting: Family Medicine

## 2022-02-18 ENCOUNTER — Ambulatory Visit (INDEPENDENT_AMBULATORY_CARE_PROVIDER_SITE_OTHER): Payer: Medicare Other | Admitting: Family Medicine

## 2022-02-18 ENCOUNTER — Encounter: Payer: Self-pay | Admitting: Family Medicine

## 2022-02-18 DIAGNOSIS — G894 Chronic pain syndrome: Secondary | ICD-10-CM

## 2022-02-18 NOTE — Progress Notes (Signed)
There were no vitals taken for this visit.   Subjective:    Patient ID: Kristopher [name], male    DOB: 03-04-1961, 61 y.o.   MRN: [id number]  HPI: Kristopher [name] is a 61 y.o. male  Chief Complaint  Patient presents with   Medication Management    Patient says his Pain Clinic Management doctor says he can no longer prescribe the pain cream for the patient and would like to discuss with provider at today's visit.    CHRONIC PAIN  Present dose: 60 Morphine equivalents Pain control status: controlled Duration: chronic Location: low back Quality: aching, shooting, sharp Current Pain Level: mild Previous Pain Level: severe Breakthrough pain: yes Benefit from narcotic medications: yes What Activities task can be accomplished with current medication? Able to do his ADLs Interested in weaning off narcotics:yes   Stool softners/OTC fiber: yes  Previous pain specialty evaluation: yes Non-narcotic analgesic meds: yes Narcotic contract: yes- with pain management  Relevant past medical, surgical, family and social history reviewed and updated as indicated. Interim medical history since our last visit reviewed. Allergies and medications reviewed and updated.  Review of Systems  Constitutional: Negative.   Respiratory: Negative.    Cardiovascular: Negative.   Gastrointestinal: Negative.   Musculoskeletal: Negative.   Psychiatric/Behavioral: Negative.      Per HPI unless specifically indicated above     Objective:    There were no vitals taken for this visit.  Wt Readings from Last 3 Encounters:  02/06/22 228 lb (103.4 kg)  12/10/21 228 lb 3.2 oz (103.5 kg)  11/12/21 229 lb (103.9 kg)    Physical Exam Vitals and nursing note reviewed.  Pulmonary:     Effort: Pulmonary effort is normal. No respiratory distress.     Comments: Speaking in full sentences Neurological:     Mental Status: He is alert.  Psychiatric:        Mood and Affect: Mood normal.        Behavior:  Behavior normal.        Thought Content: Thought content normal.        Judgment: Judgment normal.     Results for orders placed or performed in visit on 12/10/21  Bayer DCA Hb A1c Waived  Result Value Ref Range   HB A1C (BAYER DCA - WAIVED) 7.9 (H) 4.8 - 5.6 %      Assessment & Plan:   Problem List Items Addressed This Visit       Other   Chronic pain syndrome - Primary (Chronic)    Continues to follow with pain management. They have a new policy where they do not want to prescribe non-opiate medications. He is maintained on a compounded cream of ketamine, gabapentin and flexeril. Unfortunately, I am not comfortable prescribing ketamine and would prefer this medication continue to come from his pain management doctor. Staff message sent to Dr. Dossie Arbour today regarding this. Will await his response. He currently has enough medicine to last him until May.        Follow up plan: Return as scheduled.   This visit was completed via telephone due to the restrictions of the COVID-19 pandemic. All issues as above were discussed and addressed but no physical exam was performed. If it was felt that the patient should be evaluated in the office, they were directed there. The patient verbally consented to this visit. Patient was unable to complete an audio/visual visit due to Lack of equipment. Due to the catastrophic nature of  the COVID-19 pandemic, this visit was done through audio contact only. Location of the patient: home Location of the provider: work Those involved with this call:  Provider: Park Liter, DO CMA: Irena Reichmann, [place] Desk/Registration: [employer]  Time spent on call:  15 minutes on the phone discussing health concerns. 23 minutes total spent in review of patient's record and preparation of their chart.

## 2022-02-18 NOTE — Telephone Encounter (Signed)
Please let Kristopher [name] know that unfortunately, Dr. Dossie Arbour will not continue to write his compounded medication. If he would like to see a new pain management doctor for consideration of a 2nd opinion, I am happy to put in a new referral for him, as I will not be able to write his ketamine. I would be able to continue to gabapentin/flexeril compound, but not with the ketamine.

## 2022-02-18 NOTE — Assessment & Plan Note (Signed)
Continues to follow with pain management. They have a new policy where they do not want to prescribe non-opiate medications. He is maintained on a compounded cream of ketamine, gabapentin and flexeril. Unfortunately, I am not comfortable prescribing ketamine and would prefer this medication continue to come from his pain management doctor. Staff message sent to Dr. Dossie Arbour today regarding this. Will await his response. He currently has enough medicine to last him until May.

## 2022-02-18 NOTE — Telephone Encounter (Signed)
-----   Message from [name], MD sent at 02/18/2022  3:49 PM EST ----- Regarding: compounded medication This is certainly your decision to be made however I have already informed the patient that should you decide not to write for that medication, we will no longer be prescribing any either.  Please let the patient know of your decision and he has already been informed of mine.  Thank you for your time. ----- Message ----- From: [name], DO Sent: 02/18/2022   9:42 AM EST To: [name], MD  Hi Dr. Dossie Arbour,   I'm writing on our mutual patient, Kristopher [name]. He saw me today and was requesting that I take over his compounded medication as your office is no longer prescribing non-opiate pain medication again. I reviewed your note, which said the same. Unfortunately since his compounded medication contains Ketamine, I am not comfortable prescribing that as a primary care physician. Is there a possibility you can continue to prescribe that medication. As with previous patients when your office had this policy in the past, I am fine prescribing muscle relaxors, gabapentin, lyrica, tricyclics etc. However, considering this is a controlled substance, I think it would be best if it continued to come from you. Please let me know if you need to discuss this further- he does have enough medicine to last until May. I look forward to hearing from you.   Sincerely,   Park Liter, DO

## 2022-02-18 NOTE — Telephone Encounter (Signed)
Called patient to make patient aware of Dr.Johnson's recommendations. Unable to leave message as patient voicemail is not set up.   OK for [name]/Nurse Triage to give note if patient calls back.

## 2022-02-21 ENCOUNTER — Telehealth: Payer: Self-pay

## 2022-02-21 NOTE — Chronic Care Management (AMB) (Signed)
Chronic Care Management Pharmacy Assistant   Name: Kristopher [name]  MRN: [id number] DOB: November 09, 1960   Reason for Encounter: Disease State Diabetes Mellitus    Recent office visits:  02/18/22-Megan Annia Friendly, DO (PCP) Presents for medication management. Return as scheduled. Follow up in 14 weeks. 12/10/21-Megan Annia Friendly, DO (PCP) Presents for diabetes mellitus. Will add Rybelsus 3 and 7 mg. Labs ordered. Follow up in 3 months.  09/07/21-Megan Annia Friendly, DO (PCP) Presents for medical examination. Labs ordered. Follow up in 3 months.   Recent consult visits:  02/06/22-Francisco Dossie Arbour, MD (Pain Medicine) Presents for chronic pain syndrome.  11/12/21-Francisco Dossie Arbour, MD (Pain Medicine) Presents for chronic pain syndrome. Follow up in 3 months.  11/08/21-Timothy J. Grayland Ormond, MD (Oncology) Present for Clinical stage IIIa squamous cell 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 Return to clinic in 6 months with repeat CT scan and further evaluation.  Hospital visits:  None in previous 6 months  Medications: Outpatient Encounter Medications as of 02/21/2022  Medication Sig Note   Accu-Chek FastClix Lancets MISC USE TO TEST BLOOD SUGAR 2X A DAY    albuterol (PROVENTIL) (2.5 MG/3ML) 0.083% nebulizer solution Take 3 mLs (2.5 mg total) by nebulization every 6 (six) hours as needed for wheezing or shortness of breath.    albuterol (VENTOLIN HFA) 108 (90 Base) MCG/ACT inhaler Inhale into the lungs.    amLODipine (NORVASC) 5 MG tablet TAKE 1 TABLET(5 MG) BY MOUTH TWICE DAILY    atorvastatin (LIPITOR) 10 MG tablet Take 1 tablet (10 mg total) by mouth daily at 6 PM.    BREZTRI AEROSPHERE 160-9-4.8 MCG/ACT AERO Inhale 2 puffs into the lungs 2 (two) times daily. Maintenance per pulmonology    diphenhydrAMINE (BENADRYL) 25 MG tablet Take 75 mg by mouth in the morning and at bedtime.    empagliflozin (JARDIANCE) 25 MG TABS tablet TAKE 1 TABLET(25 MG) BY MOUTH DAILY BEFORE BREAKFAST     esomeprazole (NEXIUM) 40 MG capsule TAKE 1 CAPSULE(40 MG) BY MOUTH DAILY    glucose blood (ACCU-CHEK GUIDE) test strip USE TO TEST BLOOD SUGAR 2X A DAY    hydrALAZINE (APRESOLINE) 100 MG tablet Take 1 tablet (100 mg total) by mouth 2 (two) times daily.    lidocaine (LIDODERM) 5 % UNWRAP AND APPLY 1 PATCH ONTO THE SKIN DAILY. REMOVE AND DISCARD PATCH WITHIN 12 HOURS OR AS DIRECTED BY MD    metFORMIN (GLUCOPHAGE) 500 MG tablet Take 2 tablets (1,000 mg total) by mouth 2 (two) times daily with a meal.    metoprolol succinate (TOPROL-XL) 100 MG 24 hr tablet TAKE 1 TABLET BY MOUTH TWICE DAILY WITH OR IMMEDIATELY FOLLOWING A MEAL; TO BE TAKEN WITH 50 MG METOPROLOL TARTRATE    metoprolol tartrate (LOPRESSOR) 50 MG tablet TAKE 1 TABLET BY MOUTH TWICE DAILY, TO BE TAKEN WITH 100 MG METOPROLOL SUCCINATE    montelukast (SINGULAIR) 10 MG tablet TAKE 1 TABLET(10 MG) BY MOUTH DAILY    morphine (MSIR) 15 MG tablet Take 1 tablet (15 mg total) by mouth every 6 (six) hours as needed for moderate pain or severe pain. Must last 30 days.    [START ON 03/16/2022] morphine (MSIR) 15 MG tablet Take 1 tablet (15 mg total) by mouth every 6 (six) hours as needed for moderate pain or severe pain. Must last 30 days.    [START ON 04/15/2022] morphine (MSIR) 15 MG tablet Take 1 tablet (15 mg total) by mouth every 6 (six) hours as needed for moderate pain  or severe pain. Must last 30 days. 02/06/2022: WARNING: Not a Duplicate. Future prescription. DO NOT DELETE during hospital medication reconciliation or at discharge. [facility] Chronic Pain Management Patient   Multiple Vitamin (MULTIVITAMIN WITH MINERALS) TABS tablet Take 1 tablet by mouth daily.    naloxone (NARCAN) nasal spray 4 mg/0.1 mL Place 1 spray into the nose as needed for up to 365 doses (for opioid-induced respiratory depresssion). In case of emergency (overdose), spray once into each nostril. If no response within 3 minutes, repeat application and call [phone].    NONFORMULARY OR  COMPOUNDED ITEM Apply 1-2 mLs topically 4 (four) times daily as needed. 10% Ketamine/2% Cyclobenzaprine/6% Gabapentin Cream 08/13/2021: WARNING: Not a Duplicate. Future prescription. DO NOT DELETE during hospital medication reconciliation or at discharge. [facility] Chronic Pain Management Patient    nortriptyline (PAMELOR) 25 MG capsule Take 2 capsules (50 mg total) by mouth at bedtime.    Potassium Chloride ER 20 MEQ TBCR Take 1 tablet by mouth daily.    Semaglutide (RYBELSUS) 3 MG TABS Take 3 mg by mouth daily.    Semaglutide (RYBELSUS) 7 MG TABS Take 7 mg by mouth daily.    STIOLTO RESPIMAT 2.5-2.5 MCG/ACT AERS Inhale 2 puffs into the lungs 4 (four) times daily as needed. Prn per pulmonology notes    SUMAtriptan (IMITREX) 50 MG tablet Take 1 tablet (50 mg total) by mouth every 2 (two) hours as needed for migraine. Take 1 tab at onset of migraine. May repeat in 2 hours if headache persists or recurs.    Facility-Administered Encounter Medications as of 02/21/2022  Medication   0.9 %  sodium chloride infusion   sodium chloride flush (NS) 0.9 % injection 10 mL   Current antihyperglycemic regimen:  Jardiance 25 mg take 1 tab daily Metformin 500 mg take 2 tabs twice daily Rybelsus 7 mg 1 tab daily  Patient verbally confirms she is taking the above medications as directed. Yes  What recent interventions/DTPs have been made to improve glycemic control:  12/10/21-Megan Annia Friendly, DO (PCP) Presents for diabetes mellitus. Will add Rybelsus   Have there been any recent hospitalizations or ED visits since last visit with CPP? No  Patient denies hypoglycemic symptoms,Pale, Sweaty, Shaky, Hungry, Nervous/irritable, and Vision changes  Patient denies hyperglycemic symptoms, blurry vision, excessive thirst, fatigue, polyuria, and weakness  How often are you checking your blood sugar? once daily  What are your blood sugars ranging?  Fasting: 02/21/22:120 Before meals:  After meals:  Bedtime:   On  insulin? No How many units:N/a  During the week, how often does your blood glucose drop below 70? Never  Are you checking your feet daily/regularly?  Patient states he does check his feet daily.  Care Gaps: Last eye exam / Retinopathy Screening? N/a Last Annual Wellness Visit? 09/07/21 Last Diabetic Foot Exam? N/a  Adherence Review: Is the patient currently on a STATIN medication? Yes Is the patient currently on ACE/ARB medication? No Does the patient have >5 day gap between last estimated fill dates? No   Care Gaps: Zoster Vaccines- Shingrix: Overdue - never done   Star Rating Drugs: Atorvastatin 10 mg Last filled:12/25/21 90 DS, 09/07/21 90 DS Jardiance 25 mg Last filled:02/17/22 90 DS, 11/21/21 90 DS Metformin 500 mg Last filled:12/03/21 90 DS, 09/07/21 90 DS Rybelsus 3 mg Last filled:N/A Rybelsus 7 mg Last filled:12/10/21 56 DS  Myriam Elta [place], [place]

## 2022-03-02 ENCOUNTER — Other Ambulatory Visit: Payer: Self-pay | Admitting: Family Medicine

## 2022-03-05 NOTE — Telephone Encounter (Signed)
Requested Prescriptions  Pending Prescriptions Disp Refills   amLODipine (NORVASC) 5 MG tablet [Pharmacy Med Name: AMLODIPINE BESYLATE 5MG TABLETS] 180 tablet 0    Sig: TAKE 1 TABLET(5 MG) BY MOUTH TWICE DAILY     Cardiovascular: Calcium Channel Blockers 2 Failed - 03/02/2022  8:54 PM      Failed - Last BP in normal range    BP Readings from Last 1 Encounters:  02/06/22 (!) 135/90         Passed - Last Heart Rate in normal range    Pulse Readings from Last 1 Encounters:  02/06/22 76         Passed - Valid encounter within last 6 months    Recent Outpatient Visits           2 weeks ago Chronic pain syndrome   [facility] [place], [name], DO   2 months ago Type 2 diabetes mellitus with stage 1 chronic kidney disease, without long-term current use of insulin ([place])   [place], [name], DO   5 months ago Routine general medical examination at a health care facility   [facility], [place], DO   10 months ago COPD exacerbation [facility])   [place], [name], DO   10 months ago COPD exacerbation ([place])   [place], [place], DO       Future Appointments             In 1 week Johnson, [name], DO [place], [name]             metFORMIN (GLUCOPHAGE) 500 MG tablet [Pharmacy Med Name: METFORMIN 500MG TABLETS] 360 tablet 0    Sig: TAKE 2 TABLETS(1000 MG) BY MOUTH TWICE DAILY WITH A MEAL     Endocrinology:  Diabetes - Biguanides Failed - 03/02/2022  8:54 PM      Failed - B12 Level in normal range and within 720 days    No results found for: "VITAMINB12"       Passed - Cr in normal range and within 360 days    Creatinine, Ser  Date Value Ref Range Status  09/07/2021 0.87 0.76 - 1.27 mg/dL Final         Passed - HBA1C is between 0 and 7.9 and within 180 days    HB A1C (BAYER DCA - WAIVED)  Date Value Ref Range Status  12/10/2021 7.9 (H) 4.8 - 5.6 % Final     Comment:             Prediabetes: 5.7 - 6.4          Diabetes: >6.4          Glycemic control for adults with diabetes: <7.0          Passed - eGFR in normal range and within 360 days    GFR calc Af Amer  Date Value Ref Range Status  12/01/2019 >60 >60 mL/min Final   GFR, Estimated  Date Value Ref Range Status  07/05/2021 >60 >60 mL/min Final    Comment:    (NOTE) Calculated using the CKD-EPI Creatinine Equation (2021)    eGFR  Date Value Ref Range Status  09/07/2021 99 >59 mL/min/1.73 Final         Passed - Valid encounter within last 6 months    Recent Outpatient Visits           2 weeks ago Chronic pain syndrome  [place], [name], DO   2 months ago Type 2 diabetes mellitus with stage 1 chronic kidney disease, without long-term current use of insulin ([place])   [place], [name], DO   5 months ago Routine general medical examination at a health care facility   [facility], [place] P, DO   10 months ago COPD exacerbation [facility]   [place], [name], DO   10 months ago COPD exacerbation [facility])   [place], [place], DO       Future Appointments             In 1 week Wynetta Emery, Barb Merino, DO [place], [name]            Passed - CBC within normal limits and completed in the last 12 months    WBC  Date Value Ref Range Status  09/07/2021 6.0 3.4 - 10.8 x10E3/uL Final  07/05/2021 6.5 4.0 - 10.5 K/uL Final   RBC  Date Value Ref Range Status  09/07/2021 4.64 4.14 - 5.80 x10E6/uL Final  07/05/2021 5.10 4.22 - 5.81 MIL/uL Final   Hemoglobin  Date Value Ref Range Status  09/07/2021 13.8 13.0 - 17.7 g/dL Final   Hematocrit  Date Value Ref Range Status  09/07/2021 42.0 37.5 - 51.0 % Final   MCHC  Date Value Ref Range Status  09/07/2021 32.9 31.5 - 35.7 g/dL Final  07/05/2021 32.7 30.0 - 36.0 g/dL Final   [facility]  Date Value Ref Range Status   09/07/2021 29.7 26.6 - 33.0 pg Final  07/05/2021 29.6 26.0 - 34.0 pg Final   MCV  Date Value Ref Range Status  09/07/2021 91 79 - 97 fL Final   No results found for: "PLTCOUNTKUC", "LABPLAT", "POCPLA" RDW  Date Value Ref Range Status  09/07/2021 13.1 11.6 - 15.4 % Final          nortriptyline (PAMELOR) 25 MG capsule [Pharmacy Med Name: NORTRIPTYLINE 25MG CAPSULES] 180 capsule 0    Sig: TAKE 2 CAPSULES(50 MG) BY MOUTH AT BEDTIME     Psychiatry:  Antidepressants - Heterocyclics (TCAs) Passed - 03/02/2022  8:54 PM      Passed - Valid encounter within last 6 months    Recent Outpatient Visits           2 weeks ago Chronic pain syndrome   [facility] [place], [name], DO   2 months ago Type 2 diabetes mellitus with stage 1 chronic kidney disease, without long-term current use of insulin ([place])   [place], [name], DO   5 months ago Routine general medical examination at a health care facility   [facility], [place] P, DO   10 months ago COPD exacerbation [facility])   [place], [name], DO   10 months ago COPD exacerbation [facility])   [place], [place], DO       Future Appointments             In 1 week [name], DO [place], [name]             Potassium Chloride ER 20 MEQ TBCR [Pharmacy Med Name: POTASSIUM CHLORIDE 20MEQ ER TABLETS] 90 tablet 0    Sig: TAKE 1 TABLET BY MOUTH DAILY     Endocrinology:  Minerals - Potassium Supplementation Passed - 03/02/2022  8:54 PM  Passed - K in normal range and within 360 days    Potassium  Date Value Ref Range Status  09/07/2021 3.9 3.5 - 5.2 mmol/L Final         Passed - Cr in normal range and within 360 days    Creatinine, Ser  Date Value Ref Range Status  09/07/2021 0.87 0.76 - 1.27 mg/dL Final         Passed - Valid encounter within last 12 months    Recent Outpatient Visits           2 weeks  ago Chronic pain syndrome   [facility] [employer], [name], DO   2 months ago Type 2 diabetes mellitus with stage 1 chronic kidney disease, without long-term current use of insulin ([place])   [place], [name], DO   5 months ago Routine general medical examination at a health care facility   [facility], [place], DO   10 months ago COPD exacerbation [facility])   [place], [name], DO   10 months ago COPD exacerbation [facility])   [place], [place], DO       Future Appointments             In 1 week Wynetta Emery, Barb Merino, DO [place], [name]

## 2022-03-12 ENCOUNTER — Ambulatory Visit (INDEPENDENT_AMBULATORY_CARE_PROVIDER_SITE_OTHER): Payer: 59 | Admitting: Family Medicine

## 2022-03-12 ENCOUNTER — Encounter: Payer: Self-pay | Admitting: Family Medicine

## 2022-03-12 VITALS — BP 118/81 | HR 72 | Temp 97.7°F | Ht 70.0 in | Wt 230.5 lb

## 2022-03-12 DIAGNOSIS — E785 Hyperlipidemia, unspecified: Secondary | ICD-10-CM

## 2022-03-12 DIAGNOSIS — E1122 Type 2 diabetes mellitus with diabetic chronic kidney disease: Secondary | ICD-10-CM | POA: Diagnosis not present

## 2022-03-12 DIAGNOSIS — N181 Chronic kidney disease, stage 1: Secondary | ICD-10-CM | POA: Diagnosis not present

## 2022-03-12 DIAGNOSIS — I129 Hypertensive chronic kidney disease with stage 1 through stage 4 chronic kidney disease, or unspecified chronic kidney disease: Secondary | ICD-10-CM

## 2022-03-12 DIAGNOSIS — G894 Chronic pain syndrome: Secondary | ICD-10-CM | POA: Diagnosis not present

## 2022-03-12 DIAGNOSIS — I700 Atherosclerosis of aorta: Secondary | ICD-10-CM

## 2022-03-12 DIAGNOSIS — E1169 Type 2 diabetes mellitus with other specified complication: Secondary | ICD-10-CM | POA: Diagnosis not present

## 2022-03-12 LAB — BAYER DCA HB A1C WAIVED: HB A1C (BAYER DCA - WAIVED): 8.2 % — ABNORMAL HIGH (ref 4.8–5.6)

## 2022-03-12 MED ORDER — ATORVASTATIN CALCIUM 10 MG PO TABS: 10.0000 mg | ORAL_TABLET | Freq: Every day | ORAL | 1 refills | Status: DC

## 2022-03-12 MED ORDER — POTASSIUM CHLORIDE ER 20 MEQ PO TBCR: 1.0000 | EXTENDED_RELEASE_TABLET | Freq: Every day | ORAL | 1 refills | Status: DC

## 2022-03-12 MED ORDER — RYBELSUS 14 MG PO TABS: 14.0000 mg | ORAL_TABLET | Freq: Every day | ORAL | 1 refills | Status: DC

## 2022-03-12 MED ORDER — METFORMIN HCL 500 MG PO TABS: ORAL_TABLET | ORAL | 1 refills | Status: DC

## 2022-03-12 MED ORDER — EMPAGLIFLOZIN 25 MG PO TABS: ORAL_TABLET | ORAL | 1 refills | Status: DC

## 2022-03-12 MED ORDER — HYDRALAZINE HCL 100 MG PO TABS: 100.0000 mg | ORAL_TABLET | Freq: Two times a day (BID) | ORAL | 1 refills | Status: DC

## 2022-03-12 MED ORDER — BREZTRI AEROSPHERE 160-9-4.8 MCG/ACT IN AERO: 2.0000 | INHALATION_SPRAY | Freq: Two times a day (BID) | RESPIRATORY_TRACT | 6 refills | Status: DC

## 2022-03-12 MED ORDER — METOPROLOL TARTRATE 50 MG PO TABS: 50.0000 mg | ORAL_TABLET | Freq: Two times a day (BID) | ORAL | 0 refills | Status: DC

## 2022-03-12 MED ORDER — METOPROLOL SUCCINATE ER 100 MG PO TB24: ORAL_TABLET | ORAL | 1 refills | Status: DC

## 2022-03-12 MED ORDER — NORTRIPTYLINE HCL 25 MG PO CAPS: ORAL_CAPSULE | ORAL | 1 refills | Status: DC

## 2022-03-12 MED ORDER — AMLODIPINE BESYLATE 5 MG PO TABS: 5.0000 mg | ORAL_TABLET | Freq: Every day | ORAL | 1 refills | Status: DC

## 2022-03-12 NOTE — Assessment & Plan Note (Signed)
Will keep BP, sugars and cholesterol under good control. Continue to monitor. Call with any concerns.

## 2022-03-12 NOTE — Assessment & Plan Note (Signed)
Follows with pain management, but has been well controlled on a topical ketamine compound. Pain management is not comfortable continuing this medication and it is outside my scope of practice. We will get him a second opinion with pain management to see if this can be continued. Continue to monitor.

## 2022-03-12 NOTE — Assessment & Plan Note (Signed)
Not under good control with A1c of 8.2. Will increase his rybelsus to 14mg  and recheck 3 months. Call with any concerns.

## 2022-03-12 NOTE — Assessment & Plan Note (Signed)
Under good control on current regimen. Continue current regimen. Continue to monitor. Call with any concerns. Refills given. Labs drawn today.   

## 2022-03-12 NOTE — Progress Notes (Signed)
BP 118/81   Pulse 72   Temp 97.7 F (36.5 C) (Oral)   Ht 5\' 10"  (1.778 m)   Wt 230 lb 8 oz (104.6 kg)   SpO2 98%   BMI 33.07 kg/m    Subjective:    Patient ID: Kristopher [name], male    DOB: 07/18/1960, 62 y.o.   MRN: [id number]  HPI: Kristopher [name] is a 62 y.o. male  Chief Complaint  Patient presents with   Hypertension   Hyperlipidemia   Diabetes   HYPERTENSION / [place] Satisfied with current treatment? yes Duration of hypertension: chronic BP monitoring frequency: not checking BP medication side effects: no Duration of hyperlipidemia: chronic Cholesterol medication side effects: no Cholesterol supplements: none Past cholesterol medications: atorvastatin Medication compliance: excellent compliance Aspirin: no Recent stressors: no Recurrent headaches: no Visual changes: no Palpitations: no Dyspnea: no Chest pain: no Lower extremity edema: no Dizzy/lightheaded: no  DIABETES Hypoglycemic episodes:no Polydipsia/polyuria: no Visual disturbance: no Chest pain: no Paresthesias: no Glucose Monitoring: no  Accucheck frequency: Not Checking Taking Insulin?: no Blood Pressure Monitoring: not checking Retinal Examination: Up to Date Foot Exam: Up to Date Diabetic Education: Completed Pneumovax: Up to Date Influenza: Up to Date Aspirin: no  Relevant past medical, surgical, family and social history reviewed and updated as indicated. Interim medical history since our last visit reviewed. Allergies and medications reviewed and updated.  Review of Systems  Constitutional: Negative.   Respiratory: Negative.    Cardiovascular: Negative.   Gastrointestinal: Negative.   Musculoskeletal: Negative.   Neurological: Negative.   Psychiatric/Behavioral: Negative.      Per HPI unless specifically indicated above     Objective:    BP 118/81   Pulse 72   Temp 97.7 F (36.5 C) (Oral)   Ht 5\' 10"  (1.778 m)   Wt 230 lb 8 oz (104.6 kg)   SpO2 98%    BMI 33.07 kg/m   Wt Readings from Last 3 Encounters:  03/12/22 230 lb 8 oz (104.6 kg)  02/06/22 228 lb (103.4 kg)  12/10/21 228 lb 3.2 oz (103.5 kg)    Physical Exam Vitals and nursing note reviewed.  Constitutional:      General: He is not in acute distress.    Appearance: Normal appearance. He is not ill-appearing, toxic-appearing or diaphoretic.  HENT:     Head: Normocephalic and atraumatic.     Right Ear: External ear normal.     Left Ear: External ear normal.     Nose: Nose normal.     Mouth/Throat:     Mouth: Mucous membranes are moist.     Pharynx: Oropharynx is clear.  Eyes:     General: No scleral icterus.       Right eye: No discharge.        Left eye: No discharge.     Extraocular Movements: Extraocular movements intact.     Conjunctiva/sclera: Conjunctivae normal.     Pupils: Pupils are equal, round, and reactive to light.  Cardiovascular:     Rate and Rhythm: Normal rate and regular rhythm.     Pulses: Normal pulses.     Heart sounds: Normal heart sounds. No murmur heard.    No friction rub. No gallop.  Pulmonary:     Effort: Pulmonary effort is normal. No respiratory distress.     Breath sounds: Normal breath sounds. No stridor. No wheezing, rhonchi or rales.  Chest:     Chest wall: No tenderness.  Musculoskeletal:  General: Normal range of motion.     Cervical back: Normal range of motion and neck supple.  Skin:    General: Skin is warm and dry.     Capillary Refill: Capillary refill takes less than 2 seconds.     Coloration: Skin is not jaundiced or pale.     Findings: No bruising, erythema, lesion or rash.  Neurological:     General: No focal deficit present.     Mental Status: He is alert and oriented to person, place, and time. Mental status is at baseline.  Psychiatric:        Mood and Affect: Mood normal.        Behavior: Behavior normal.        Thought Content: Thought content normal.        Judgment: Judgment normal.     Results for  orders placed or performed in visit on 03/12/22  Bayer DCA Hb A1c Waived  Result Value Ref Range   HB A1C (BAYER DCA - WAIVED) 8.2 (H) 4.8 - 5.6 %      Assessment & Plan:   Problem List Items Addressed This Visit       Cardiovascular and Mediastinum   Aortic atherosclerosis ([place])    Will keep BP, sugars and cholesterol under good control. Continue to monitor. Call with any concerns.       Relevant Medications   amLODipine (NORVASC) 5 MG tablet   atorvastatin (LIPITOR) 10 MG tablet   hydrALAZINE (APRESOLINE) 100 MG tablet   metoprolol succinate (TOPROL-XL) 100 MG 24 hr tablet   metoprolol tartrate (LOPRESSOR) 50 MG tablet     Endocrine   Type 2 diabetes mellitus with stage 1 chronic kidney disease, without long-term current use of insulin ([place]) - Primary    Not under good control with A1c of 8.2. Will increase his rybelsus to 14mg  and recheck 3 months. Call with any concerns.       Relevant Medications   Semaglutide (RYBELSUS) 14 MG TABS   atorvastatin (LIPITOR) 10 MG tablet   empagliflozin (JARDIANCE) 25 MG TABS tablet   metFORMIN (GLUCOPHAGE) 500 MG tablet   Other Relevant Orders   CBC with Differential/Platelet   Bayer DCA Hb A1c Waived (Completed)   Comprehensive metabolic panel   Hyperlipidemia associated with type 2 diabetes mellitus ([place])    Under good control on current regimen. Continue current regimen. Continue to monitor. Call with any concerns. Refills given. Labs drawn today.       Relevant Medications   Semaglutide (RYBELSUS) 14 MG TABS   amLODipine (NORVASC) 5 MG tablet   atorvastatin (LIPITOR) 10 MG tablet   empagliflozin (JARDIANCE) 25 MG TABS tablet   hydrALAZINE (APRESOLINE) 100 MG tablet   metFORMIN (GLUCOPHAGE) 500 MG tablet   metoprolol succinate (TOPROL-XL) 100 MG 24 hr tablet   metoprolol tartrate (LOPRESSOR) 50 MG tablet   Other Relevant Orders   CBC with Differential/Platelet   Lipid Panel w/o Chol/HDL Ratio   Comprehensive metabolic  panel     Genitourinary   Benign hypertensive renal disease    Under good control on current regimen. Continue current regimen. Continue to monitor. Call with any concerns. Refills given. Labs drawn today.      Relevant Orders   CBC with Differential/Platelet   Comprehensive metabolic panel     Other   Chronic pain syndrome (Chronic)    Follows with pain management, but has been well controlled on a topical ketamine compound. Pain management is not comfortable  continuing this medication and it is outside my scope of practice. We will get him a second opinion with pain management to see if this can be continued. Continue to monitor.       Relevant Medications   nortriptyline (PAMELOR) 25 MG capsule   Other Relevant Orders   Ambulatory referral to Pain Clinic     Follow up plan: Return in about 3 months (around 06/11/2022).

## 2022-03-13 ENCOUNTER — Ambulatory Visit: Payer: Self-pay | Admitting: *Deleted

## 2022-03-13 ENCOUNTER — Other Ambulatory Visit: Payer: Self-pay | Admitting: Family Medicine

## 2022-03-13 LAB — CBC WITH DIFFERENTIAL/PLATELET
Basophils Absolute: 0.1 10*3/uL (ref 0.0–0.2)
Basos: 1 %
EOS (ABSOLUTE): 0.1 10*3/uL (ref 0.0–0.4)
Eos: 2 %
Hematocrit: 45.3 % (ref 37.5–51.0)
Hemoglobin: 14.6 g/dL (ref 13.0–17.7)
Immature Grans (Abs): 0 10*3/uL (ref 0.0–0.1)
Immature Granulocytes: 1 %
Lymphocytes Absolute: 1.7 10*3/uL (ref 0.7–3.1)
Lymphs: 28 %
MCH: 29.3 pg (ref 26.6–33.0)
MCHC: 32.2 g/dL (ref 31.5–35.7)
MCV: 91 fL (ref 79–97)
Monocytes Absolute: 0.4 10*3/uL (ref 0.1–0.9)
Monocytes: 7 %
Neutrophils Absolute: 3.6 10*3/uL (ref 1.4–7.0)
Neutrophils: 61 %
Platelets: 158 10*3/uL (ref 150–450)
RBC: 4.98 x10E6/uL (ref 4.14–5.80)
RDW: 13.7 % (ref 11.6–15.4)
WBC: 5.9 10*3/uL (ref 3.4–10.8)

## 2022-03-13 LAB — COMPREHENSIVE METABOLIC PANEL
ALT: 53 IU/L — ABNORMAL HIGH (ref 0–44)
AST: 48 IU/L — ABNORMAL HIGH (ref 0–40)
Albumin/Globulin Ratio: 1.6 (ref 1.2–2.2)
Albumin: 4.6 g/dL (ref 3.8–4.9)
Alkaline Phosphatase: 175 IU/L — ABNORMAL HIGH (ref 44–121)
BUN/Creatinine Ratio: 14 (ref 10–24)
BUN: 11 mg/dL (ref 8–27)
Bilirubin Total: 0.4 mg/dL (ref 0.0–1.2)
CO2: 23 mmol/L (ref 20–29)
Calcium: 9.4 mg/dL (ref 8.6–10.2)
Chloride: 102 mmol/L (ref 96–106)
Creatinine, Ser: 0.79 mg/dL (ref 0.76–1.27)
Globulin, Total: 2.9 g/dL (ref 1.5–4.5)
Glucose: 100 mg/dL — ABNORMAL HIGH (ref 70–99)
Potassium: 3.8 mmol/L (ref 3.5–5.2)
Sodium: 142 mmol/L (ref 134–144)
Total Protein: 7.5 g/dL (ref 6.0–8.5)
eGFR: 102 mL/min/{1.73_m2} (ref >59)

## 2022-03-13 LAB — LIPID PANEL W/O CHOL/HDL RATIO
Cholesterol, Total: 141 mg/dL (ref 100–199)
HDL: 32 mg/dL — ABNORMAL LOW (ref >39)
LDL Chol Calc (NIH): 67 mg/dL (ref 0–99)
Triglycerides: 263 mg/dL — ABNORMAL HIGH (ref 0–149)
VLDL Cholesterol Cal: 42 mg/dL — ABNORMAL HIGH (ref 5–40)

## 2022-03-13 NOTE — Telephone Encounter (Signed)
Requested by interface surescripts. Receipt confirmed by pharmacy 03/12/22 at 8:55 am. Rx signed 03/12/22. Duplicate request.  Requested Prescriptions  Refused Prescriptions Disp Refills   metoprolol tartrate (LOPRESSOR) 50 MG tablet [Pharmacy Med Name: METOPROLOL TARTRATE 50MG  TABLETS] 180 tablet 0    Sig: TAKE 1 TABLET BY MOUTH TWICE DAILY, TO BE TAKEN WITH 100MG  METOPROLOL TARTRATE     Cardiovascular:  Beta Blockers Passed - 03/13/2022  8:04 AM      Passed - Last BP in normal range    BP Readings from Last 1 Encounters:  03/12/22 118/81         Passed - Last Heart Rate in normal range    Pulse Readings from Last 1 Encounters:  03/12/22 72         Passed - Valid encounter within last 6 months    Recent Outpatient Visits           Yesterday Type 2 diabetes mellitus with stage 1 chronic kidney disease, without long-term current use of insulin ([place])   [facility] [place], [name], DO   3 weeks ago Chronic pain syndrome   [facility] [place], [name], DO   3 months ago Type 2 diabetes mellitus with stage 1 chronic kidney disease, without long-term current use of insulin ([place])   [place], [name], DO   6 months ago Routine general medical examination at a health care facility   [facility], [place] P, DO   10 months ago COPD exacerbation [facility])   [place], Barb Merino, DO       Future Appointments             In 3 months Johnson, Barb Merino, DO [name]

## 2022-03-13 NOTE — Telephone Encounter (Signed)
Reason for Disposition  Caller has medicine question only, adult not sick, AND triager answers question  Answer Assessment - Initial Assessment Questions 1. NAME of MEDICINE: "What medicine(s) are you calling about?"     Returned call to Kristopher [name] on [employer]. Rybelsus 14 mg.   He was already on the 7 mg.    2. QUESTION: "What is your question?" (e.g., double dose of medicine, side effect)     What dose is he supposed to take.   He was already on 7 mg. 3. PRESCRIBER: "Who prescribed the medicine?" Reason: if prescribed by specialist, call should be referred to that group.     Dr. Wynetta Emery 4. SYMPTOMS: "Do you have any symptoms?" If Yes, ask: "What symptoms are you having?"  "How bad are the symptoms (e.g., mild, moderate, severe)     N/A 5. PREGNANCY:  "Is there any chance that you are pregnant?" "When was your last menstrual period?"     N/A  Protocols used: Medication Question [place]  Chief Complaint: Needed clarification on the dose of Rybelsus he is to be taking.   7 mg along with the 14 mg or just the 14 mg? Symptoms: N/A Frequency: N/A Pertinent Negatives: Patient denies N/A Disposition: [] ED /[] Urgent Care (no appt availability in office) / [] Appointment(In office/virtual)/ []  [facility]/ [x] Home Care/ [] Refused Recommended Disposition /[] [place] [facility]/ []  Follow-up with PCP Additional Notes: Referring to Dr. Durenda Age office visit note from 03/12/2022 I let Kristopher [name], On his DPR know he is to take the Rybelsus 14 mg only.   She verbalized understanding and thanked me for returning her call.

## 2022-03-13 NOTE — Telephone Encounter (Signed)
Message from Erick Blinks sent at 03/13/2022  3:07 PM EST  Summary: Medication questions   Pt's wife called for medication clarification following changes made yesterday after his visit with his PCP. Please advise          Call History   Type Contact Phone/Fax User  03/13/2022 03:03 PM EST Phone (Incoming) Reed,Melinda (Emergency Contact) [phone] Kristopher [name]

## 2022-04-01 ENCOUNTER — Ambulatory Visit: Payer: 59

## 2022-04-01 ENCOUNTER — Other Ambulatory Visit: Payer: Self-pay | Admitting: Family Medicine

## 2022-04-01 MED ORDER — STIOLTO RESPIMAT 2.5-2.5 MCG/ACT IN AERS: 2.0000 | INHALATION_SPRAY | Freq: Four times a day (QID) | RESPIRATORY_TRACT | 12 refills | Status: DC | PRN

## 2022-04-01 NOTE — Patient Outreach (Signed)
Care Management & Coordination Services Pharmacy Note  04/01/2022 Name:  Kristopher [name] MRN:  [id number] DOB:  [birth year]  Summary: -[employer] fan. From [place]  Recommendations/Changes made from today's visit: -Counseled patient to start writing down sugars -With elevated lipids and ASCVD risk of 25%. Recommend Max dose statin  -I am unsure what the Metoprolol dose's indication as well as Hydralazine. Will co-sign PCP since she has greater Hx to ask  -Could replace with ACE/ARB, especially with urine albumin    Subjective: Kristopher [name] is an 62 y.o. year old male who is a primary patient of [name], DO.  The care coordination team was consulted for assistance with disease management and care coordination needs.    Engaged with patient by telephone for follow up visit.  Recent office visits:  02/18/22-Megan Annia Friendly, DO (PCP) Presents for medication management. Return as scheduled. Follow up in 14 weeks. 12/10/21-Megan Annia Friendly, DO (PCP) Presents for diabetes mellitus. Will add Rybelsus 3 and 7 mg. Labs ordered. Follow up in 3 months.  09/07/21-Megan Annia Friendly, DO (PCP) Presents for medical examination. Labs ordered. Follow up in 3 months.    Recent consult visits:  02/06/22-Francisco Dossie Arbour, MD (Pain Medicine) Presents for chronic pain syndrome.  11/12/21-Francisco Dossie Arbour, MD (Pain Medicine) Presents for chronic pain syndrome. Follow up in 3 months.  11/08/21-Timothy J. Grayland Ormond, MD (Oncology) Present for Clinical stage IIIa squamous cell 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 Return to clinic in 6 months with repeat CT scan and further evaluation.   Hospital visits:  None in previous 6 months   Objective:  Lab Results  Component Value Date   CREATININE 0.79 03/12/2022   BUN 11 03/12/2022   EGFR 102 03/12/2022   GFRNONAA >60 07/05/2021   GFRAA >60 12/01/2019   NA 142 03/12/2022   K 3.8 03/12/2022   CALCIUM 9.4 03/12/2022   CO2 23 03/12/2022    GLUCOSE 100 (H) 03/12/2022    Lab Results  Component Value Date/Time   HGBA1C 8.2 (H) 03/12/2022 08:33 AM   HGBA1C 7.9 (H) 12/10/2021 08:23 AM   MICROALBUR 30 (H) 09/07/2021 10:10 AM   MICROALBUR 10 03/08/2021 09:33 AM    Last diabetic Eye exam:  Lab Results  Component Value Date/Time   HMDIABEYEEXA No Retinopathy 08/29/2021 12:00 AM    Last diabetic Foot exam: No results found for: "HMDIABFOOTEX"   Lab Results  Component Value Date   CHOL 141 03/12/2022   HDL 32 (L) 03/12/2022   LDLCALC 67 03/12/2022   TRIG 263 (H) 03/12/2022   CHOLHDL 7.0 08/14/2017       Latest Ref Rng & Units 03/12/2022    8:35 AM 09/07/2021   10:12 AM 07/05/2021   10:28 AM  Hepatic Function  Total Protein 6.0 - 8.5 g/dL 7.5  7.3  8.1   Albumin 3.8 - 4.9 g/dL 4.6  4.6  4.5   AST 0 - 40 IU/L 48  41  54   ALT 0 - 44 IU/L 53  53  58   Alk Phosphatase 44 - 121 IU/L 175  167  136   Total Bilirubin 0.0 - 1.2 mg/dL 0.4  0.3  0.6     Lab Results  Component Value Date/Time   TSH 1.710 09/07/2021 10:12 AM   TSH 2.126 07/05/2021 10:28 AM   TSH 1.230 03/08/2021 09:36 AM   FREET4 0.79 10/11/2020 08:08 AM       Latest Ref Rng & Units 03/12/2022  8:35 AM 09/07/2021   10:12 AM 07/05/2021   10:28 AM  CBC  WBC 3.4 - 10.8 x10E3/uL 5.9  6.0  6.5   Hemoglobin 13.0 - 17.7 g/dL 14.6  13.8  15.1   Hematocrit 37.5 - 51.0 % 45.3  42.0  46.2   Platelets 150 - 450 x10E3/uL 158  169  168     No results found for: "VD25OH", "VITAMINB12"  Clinical ASCVD: Yes  The 10-year ASCVD risk score (Arnett DK, et al., 2019) is: 25%   Values used to calculate the score:     Age: 30 years     Sex: Male     Is Non-Hispanic African American: No     Diabetic: Yes     Tobacco smoker: Yes     Systolic Blood Pressure: 295 mmHg     Is BP treated: Yes     HDL Cholesterol: 32 mg/dL     Total Cholesterol: 141 mg/dL    Other: (CHADS2VASc if Afib, MMRC or CAT for COPD, ACT, DEXA)     11/12/2021    8:01 AM 09/07/2021   10:08 AM  08/20/2021    8:42 AM  Depression screen PHQ 2/9  Decreased Interest 0 0 0  Down, Depressed, Hopeless 0 0 0  PHQ - 2 Score 0 0 0     Social History   Tobacco Use  Smoking Status Former   Packs/day: 0.25   Years: 35.00   Total pack years: 8.75   Types: Cigarettes   Quit date: 01/14/2019   Years since quitting: 3.2  Smokeless Tobacco Never   BP Readings from Last 3 Encounters:  03/12/22 118/81  02/06/22 (!) 135/90  12/10/21 121/83   Pulse Readings from Last 3 Encounters:  03/12/22 72  02/06/22 76  12/10/21 69   Wt Readings from Last 3 Encounters:  03/12/22 230 lb 8 oz (104.6 kg)  02/06/22 228 lb (103.4 kg)  12/10/21 228 lb 3.2 oz (103.5 kg)   BMI Readings from Last 3 Encounters:  03/12/22 33.07 kg/m  02/06/22 32.71 kg/m  12/10/21 32.74 kg/m    Allergies  Allergen Reactions   Acetaminophen Swelling   Aspirin Anaphylaxis   Epinephrine Anaphylaxis    Does not include albuterol   Novocain [Procaine] Anaphylaxis   Penicillins Anaphylaxis    Has patient had a PCN reaction causing immediate rash, facial/tongue/throat swelling, SOB or lightheadedness with hypotension: Yes Has patient had a PCN reaction causing severe rash involving mucus membranes or skin necrosis: No Has patient had a PCN reaction that required hospitalization: Yes Has patient had a PCN reaction occurring within the last 10 years: No If all of the above answers are "NO", then may proceed with Cephalosporin use.    Strawberry Extract Anaphylaxis   Shellfish Allergy Hives and Nausea And Vomiting    Medications Reviewed Today     Reviewed by [name], DO (Physician) on 03/12/22 at 1334  Med List Status: <None>   Medication Order Taking? Sig Documenting Provider Last Dose Status Informant  0.9 %  sodium chloride infusion [id number]   Dionne Bucy, CRNA  Active   Accu-Chek FastClix Lancets [place] [id number] Yes USE TO TEST BLOOD SUGAR 2X A DAY Volney American, PA-C Taking Active  Spouse/Significant Other  albuterol (PROVENTIL) (2.5 MG/3ML) 0.083% nebulizer solution [id number] Yes Take 3 mLs (2.5 mg total) by nebulization every 6 (six) hours as needed for wheezing or shortness of breath. Johnson, [name], DO Taking Active   albuterol (VENTOLIN HFA)  108 (90 Base) MCG/ACT inhaler [id number] Yes Inhale into the lungs. [provider] Taking Active   amLODipine (NORVASC) 5 MG tablet [id number]  Take 1 tablet (5 mg total) by mouth daily. Johnson, [name], DO  Active   atorvastatin (LIPITOR) 10 MG tablet [id number]  Take 1 tablet (10 mg total) by mouth daily at 6 PM. [name], DO  Active   BREZTRI AEROSPHERE 160-9-4.8 MCG/ACT Hollie Salk [id number]  Inhale 2 puffs into the lungs 2 (two) times daily. Maintenance per pulmonology Park Liter P, DO  Active   diphenhydrAMINE (BENADRYL) 25 MG tablet [id number] Yes Take 75 mg by mouth in the morning and at bedtime. [provider] Taking Active Spouse/Significant Other  empagliflozin (JARDIANCE) 25 MG TABS tablet [id number]  TAKE 1 TABLET(25 MG) BY MOUTH DAILY BEFORE BREAKFAST Johnson, [name], DO  Active   esomeprazole (NEXIUM) 40 MG capsule [id number] Yes TAKE 1 CAPSULE(40 MG) BY MOUTH DAILY Wynetta Emery, [name], DO Taking Active   glucose blood (ACCU-CHEK GUIDE) test strip [id number] Yes USE TO TEST BLOOD SUGAR 2X A DAY Johnson, [name], DO Taking Active   hydrALAZINE (APRESOLINE) 100 MG tablet [id number]  Take 1 tablet (100 mg total) by mouth 2 (two) times daily. Johnson, [name], DO  Active   lidocaine (LIDODERM) 5 % [id number] Yes UNWRAP AND APPLY 1 PATCH ONTO THE SKIN DAILY. REMOVE AND DISCARD PATCH WITHIN 12 HOURS OR AS DIRECTED BY MD Park Liter P, DO Taking Active   metFORMIN (GLUCOPHAGE) 500 MG tablet [id number]  TAKE 2 TABLETS(1000 MG) BY MOUTH TWICE DAILY WITH A MEAL Johnson, [name], DO  Active   metoprolol succinate (TOPROL-XL) 100 MG 24 hr tablet [id number]  TAKE 1 TABLET BY MOUTH TWICE DAILY WITH OR IMMEDIATELY  FOLLOWING A MEAL; TO BE TAKEN WITH 50 MG METOPROLOL TARTRATE Johnson, [name], DO  Active   metoprolol tartrate (LOPRESSOR) 50 MG tablet [id number]  Take 1 tablet (50 mg total) by mouth 2 (two) times daily. TO BE TAKEN WITH 100 MG METOPROLOL TARTRATE Johnson, [name], DO  Active   montelukast (SINGULAIR) 10 MG tablet [id number] Yes TAKE 1 TABLET(10 MG) BY MOUTH DAILY Wynetta Emery, [name], DO Taking Active   morphine (MSIR) 15 MG tablet [id number] Yes Take 1 tablet (15 mg total) by mouth every 6 (six) hours as needed for moderate pain or severe pain. Must last 30 days. [name], MD Taking Active   morphine (MSIR) 15 MG tablet [id number] Yes Take 1 tablet (15 mg total) by mouth every 6 (six) hours as needed for moderate pain or severe pain. Must last 30 days. [name], MD Taking Active   morphine (MSIR) 15 MG tablet [id number] Yes Take 1 tablet (15 mg total) by mouth every 6 (six) hours as needed for moderate pain or severe pain. Must last 30 days. [name], MD Taking Active            Med Note Dossie Arbour, [place] A   Wed Feb 06, 2022  8:27 AM) WARNING: Not a Duplicate. Future prescription. DO NOT DELETE during hospital medication reconciliation or at discharge. [facility] Patient  Multiple Vitamin (MULTIVITAMIN WITH MINERALS) TABS tablet [id number] Yes Take 1 tablet by mouth daily. [provider] Taking Active Spouse/Significant Other  naloxone [facility]) nasal spray 4 mg/0.1 mL [id number] Yes Place 1 spray into the nose as needed for up to 365 doses (for opioid-induced respiratory depresssion). In case of emergency (overdose), spray once into each nostril. If no  response within 3 minutes, repeat application and call [phone]. [name], MD Taking Active   NONFORMULARY OR COMPOUNDED ITEM [id number] Yes Apply 1-2 mLs topically 4 (four) times daily as needed. 10% Ketamine/2% Cyclobenzaprine/6% Gabapentin Cream [name], MD Taking Active             Med Note Dossie Arbour, [place] A   Mon Aug 13, 2021  8:58 AM) WARNING: Not a Duplicate. Future prescription. DO NOT DELETE during hospital medication reconciliation or at discharge. [facility] Patient   nortriptyline (PAMELOR) 25 MG capsule [id number]  TAKE 2 CAPSULES(50 MG) BY MOUTH AT BEDTIME [name], DO  Active   Potassium Chloride ER 20 MEQ TBCR [id number]  Take 1 tablet by mouth daily. Johnson, [name], DO  Active   Semaglutide (RYBELSUS) 14 MG TABS [id number] Yes Take 1 tablet (14 mg total) by mouth daily. Johnson, [name], DO  Active   sodium chloride flush (NS) 0.9 % injection 10 mL [id number]   [name], MD  Active   STIOLTO RESPIMAT 2.5-2.5 MCG/ACT AERS [id number] Yes Inhale 2 puffs into the lungs 4 (four) times daily as needed. Prn per pulmonology notes [name], DO Taking Active   SUMAtriptan (IMITREX) 50 MG tablet [id number] Yes Take 1 tablet (50 mg total) by mouth every 2 (two) hours as needed for migraine. Take 1 tab at onset of migraine. May repeat in 2 hours if headache persists or recurs. [name], DO Taking Active   Med List Note Janett Billow, [place] 02/06/22 3704): UDS 05/14/21 MR 05/15/22 [employer] is the company you use to fax his compounding cream [phone]            SDOH:  (Social Determinants of Health) assessments and interventions performed: Yes SDOH Interventions    Flowsheet Row Care Coordination from 04/01/2022 in [place] from 08/20/2021 in [place] Management from 11/24/2020 in [place] Management from 09/28/2019 in [place] Management from 06/11/2019 in [place] Interventions       Food Insecurity Interventions -- Intervention Not Indicated -- -- --  Transportation Interventions Intervention Not Indicated Intervention Not  Indicated -- -- --  Financial Strain Interventions Intervention Not Indicated Intervention Not Indicated Other (Comment)  [having a hard time paying for hospital bills, has talked to billing department] -- --  Physical Activity Interventions -- Intervention Not Indicated -- -- Other (Comments)  [the patient is increasing his activity daily, sometimes pushes self and can tell the next day]  Social Connections Interventions -- Intervention Not Indicated Intervention Not Indicated -- --  Tobacco Interventions -- -- -- Cessation Materials Given and Reviewed, Other (Comment)  [motivational interviewing] --        Name and location of Current pharmacy:  [id number] [name], [place] [address] [place] [address] Phone: [phone] Fax: [phone]  [facility] [place] [place] - [address] [place] [address] Phone: [phone] Fax: [phone]   Compliance/Adherence/Medication fill history: Care Gaps: Zoster Vaccines- Shingrix: Overdue - never done    Star Rating Drugs: Atorvastatin 10 mg Last filled:12/25/21 90 DS, 09/07/21 90 DS Jardiance 25 mg Last filled:02/17/22 90 DS, 11/21/21 90 DS Metformin 500 mg Last filled:12/03/21 90 DS, 09/07/21 90 DS Rybelsus  3 mg Last filled:N/A Rybelsus 7 mg Last filled:12/10/21 90 DS   Assessment/Plan   Hypertension (BP goal <140/90) BP Readings from Last 3 Encounters:  03/12/22 118/81  02/06/22 (!) 135/90  12/10/21 121/83   -Controlled -Current treatment: Amlodipine 5mg  Appropriate, Effective, Safe, Accessible Metoprolol 100mg  Succinate BID Appropriate, Effective, Safe, Accessible Metoprolol Tart 50mg  BID Appropriate, Effective, Safe, Accessible Hydralazine 100mg  BID Query Appropriate,  -Medications previously tried: N/A  -Current home readings: Doesn't test -Current dietary habits: "Tries to eat healthy" -Current exercise  habits: None -Denies hypotensive/hypertensive symptoms -Educated on BP goals and benefits of medications for prevention of heart attack, stroke and kidney damage; Jan 2024: Will ask PCP about Metoprolol dosing and indication as well as Hydralazine. -Candidate for ACE/ARB  Hyperlipidemia: (LDL goal < 70) The 10-year ASCVD risk score (Arnett DK, et al., 2019) is: 25%   Values used to calculate the score:     Age: 59 years     Sex: Male     Is Non-Hispanic African American: No     Diabetic: Yes     Tobacco smoker: Yes     Systolic Blood Pressure: 937 mmHg     Is BP treated: Yes     HDL Cholesterol: 32 mg/dL     Total Cholesterol: 141 mg/dL Lab Results  Component Value Date   CHOL 141 03/12/2022   CHOL 137 09/07/2021   CHOL 206 (H) 03/08/2021   Lab Results  Component Value Date   HDL 32 (L) 03/12/2022   HDL 34 (L) 09/07/2021   HDL 32 (L) 03/08/2021   Lab Results  Component Value Date   LDLCALC 67 03/12/2022   LDLCALC 60 09/07/2021   LDLCALC 128 (H) 03/08/2021   Lab Results  Component Value Date   TRIG 263 (H) 03/12/2022   TRIG 268 (H) 09/07/2021   TRIG 257 (H) 03/08/2021   Lab Results  Component Value Date   CHOLHDL 7.0 08/14/2017   No results found for: "LDLDIRECT" Last vitamin D No results found for: "25OHVITD2", "25OHVITD3", "VD25OH" Lab Results  Component Value Date   TSH 1.710 09/07/2021  -Uncontrolled -Current treatment: Atorvastatin 10mg  Query Appropriate,  -Medications previously tried: N/A  -Current dietary patterns: "Tries to eat healthy" -Current exercise habits: N/A -Educated on Cholesterol goals;  Jan 2024: Recommend high intensity statin  Diabetes (A1c goal <8%) Lab Results  Component Value Date   HGBA1C 8.2 (H) 03/12/2022   HGBA1C 7.9 (H) 12/10/2021   HGBA1C 7.9 (H) 09/07/2021   Lab Results  Component Value Date   MICROALBUR 30 (H) 09/07/2021   LDLCALC 67 03/12/2022   CREATININE 0.79 03/12/2022    Lab Results  Component Value Date    NA 142 03/12/2022   K 3.8 03/12/2022   CREATININE 0.79 03/12/2022   EGFR 102 03/12/2022   GFRNONAA >60 07/05/2021   GLUCOSE 100 (H) 03/12/2022    Lab Results  Component Value Date   WBC 5.9 03/12/2022   HGB 14.6 03/12/2022   HCT 45.3 03/12/2022   MCV 91 03/12/2022   PLT 158 03/12/2022    Lab Results  Component Value Date   LABMICR See below: 06/24/2016   LABMICR See below: 01/22/2016   MICROALBUR 30 (H) 09/07/2021   MICROALBUR 10 03/08/2021   -Uncontrolled -Current medications: Empagliflozin 25mg  Appropriate, Query effective, Safe, Accessible Metformin 500mg  2BID Appropriate, Query effective, Safe, Accessible Semaglutide 14mg  Appropriate, Query effective, Safe, Accessible -Medications previously tried: N/A  -Current home glucose readings fasting glucose:  Jan 2024: Doesn't write  down but states 129 is normal -Current exercise: None -Educated on A1c and blood sugar goals; -Counseled to check feet daily and get yearly eye exams Jan 2024:  -Candidate for ACE/ARB -Will have CCM team call in 2 weeks for daily sugar readings, patient agreed. Will assess additive dosing then  Malignant neoplasm of lung (Goal: control symptoms and prevent exacerbations) -Controlled -Current treatment  Stiolto Appropriate, Effective, Safe, Accessible Breztri Appropriate, Effective, Safe, Accessible -Medications previously tried: N/A  -Gold Grade: Unknown -Current COPD Classification:  Unknown -MMRC/CAT score:      No data to display        -Pulmonary function testing:  Pulmonary Functions Testing Results:  No results found for: "FEV1", "FVC", "FEV1FVC", "TLC", "DLCO" PF Readings from Last 3 Encounters:  No data found for PF  -Patient denies consistent use of maintenance inhaler -Counseled on Proper inhaler technique; -Recommended to continue current medication  CPP F/U March 2024  [place] Constable, Pharm.D. - [phone]

## 2022-04-17 ENCOUNTER — Telehealth: Payer: Self-pay

## 2022-04-17 NOTE — Progress Notes (Cosign Needed)
Care Management & Coordination Services Pharmacy Team  Reason for Encounter: Diabetes  Contacted patient to discuss diabetes disease state. Spoke with patient on 04/17/2022    Recent office visits:  None noted  Recent consult visits:  None noted  Hospital visits:  None in previous 6 months  Medications: Outpatient Encounter Medications as of 04/17/2022  Medication Sig Note   Accu-Chek FastClix Lancets MISC USE TO TEST BLOOD SUGAR 2X A DAY    albuterol (PROVENTIL) (2.5 MG/3ML) 0.083% nebulizer solution Take 3 mLs (2.5 mg total) by nebulization every 6 (six) hours as needed for wheezing or shortness of breath.    albuterol (VENTOLIN HFA) 108 (90 Base) MCG/ACT inhaler Inhale into the lungs.    amLODipine (NORVASC) 5 MG tablet Take 1 tablet (5 mg total) by mouth daily.    atorvastatin (LIPITOR) 10 MG tablet Take 1 tablet (10 mg total) by mouth daily at 6 PM.    BREZTRI AEROSPHERE 160-9-4.8 MCG/ACT AERO Inhale 2 puffs into the lungs 2 (two) times daily. Maintenance per pulmonology    diphenhydrAMINE (BENADRYL) 25 MG tablet Take 75 mg by mouth in the morning and at bedtime.    empagliflozin (JARDIANCE) 25 MG TABS tablet TAKE 1 TABLET(25 MG) BY MOUTH DAILY BEFORE BREAKFAST    esomeprazole (NEXIUM) 40 MG capsule TAKE 1 CAPSULE(40 MG) BY MOUTH DAILY    glucose blood (ACCU-CHEK GUIDE) test strip USE TO TEST BLOOD SUGAR 2X A DAY    hydrALAZINE (APRESOLINE) 100 MG tablet Take 1 tablet (100 mg total) by mouth 2 (two) times daily.    lidocaine (LIDODERM) 5 % UNWRAP AND APPLY 1 PATCH ONTO THE SKIN DAILY. REMOVE AND DISCARD PATCH WITHIN 12 HOURS OR AS DIRECTED BY MD    metFORMIN (GLUCOPHAGE) 500 MG tablet TAKE 2 TABLETS(1000 MG) BY MOUTH TWICE DAILY WITH A MEAL    metoprolol succinate (TOPROL-XL) 100 MG 24 hr tablet TAKE 1 TABLET BY MOUTH TWICE DAILY WITH OR IMMEDIATELY FOLLOWING A MEAL; TO BE TAKEN WITH 50 MG METOPROLOL TARTRATE    metoprolol tartrate (LOPRESSOR) 50 MG tablet Take 1 tablet (50 mg  total) by mouth 2 (two) times daily. TO BE TAKEN WITH 100 MG METOPROLOL TARTRATE    montelukast (SINGULAIR) 10 MG tablet TAKE 1 TABLET(10 MG) BY MOUTH DAILY    morphine (MSIR) 15 MG tablet Take 1 tablet (15 mg total) by mouth every 6 (six) hours as needed for moderate pain or severe pain. Must last 30 days.    morphine (MSIR) 15 MG tablet Take 1 tablet (15 mg total) by mouth every 6 (six) hours as needed for moderate pain or severe pain. Must last 30 days.    morphine (MSIR) 15 MG tablet Take 1 tablet (15 mg total) by mouth every 6 (six) hours as needed for moderate pain or severe pain. Must last 30 days. 02/06/2022: WARNING: Not a Duplicate. Future prescription. DO NOT DELETE during hospital medication reconciliation or at discharge. [facility] Patient   Multiple Vitamin (MULTIVITAMIN WITH MINERALS) TABS tablet Take 1 tablet by mouth daily.    naloxone (NARCAN) nasal spray 4 mg/0.1 mL Place 1 spray into the nose as needed for up to 365 doses (for opioid-induced respiratory depresssion). In case of emergency (overdose), spray once into each nostril. If no response within 3 minutes, repeat application and call [phone].    NONFORMULARY OR COMPOUNDED ITEM Apply 1-2 mLs topically 4 (four) times daily as needed. 10% Ketamine/2% Cyclobenzaprine/6% Gabapentin Cream 08/13/2021: WARNING: Not a Duplicate.  Future prescription. DO NOT DELETE during hospital medication reconciliation or at discharge. [facility] Patient    nortriptyline (PAMELOR) 25 MG capsule TAKE 2 CAPSULES(50 MG) BY MOUTH AT BEDTIME    Potassium Chloride ER 20 MEQ TBCR Take 1 tablet by mouth daily.    Semaglutide (RYBELSUS) 14 MG TABS Take 1 tablet (14 mg total) by mouth daily.    STIOLTO RESPIMAT 2.5-2.5 MCG/ACT AERS Inhale 2 puffs into the lungs 4 (four) times daily as needed. Prn per pulmonology notes    SUMAtriptan (IMITREX) 50 MG tablet Take 1 tablet (50 mg total) by mouth every 2 (two) hours as needed for  migraine. Take 1 tab at onset of migraine. May repeat in 2 hours if headache persists or recurs.    Facility-Administered Encounter Medications as of 04/17/2022  Medication   0.9 %  sodium chloride infusion   sodium chloride flush (NS) 0.9 % injection 10 mL    Recent Relevant Labs: Lab Results  Component Value Date/Time   HGBA1C 8.2 (H) 03/12/2022 08:33 AM   HGBA1C 7.9 (H) 12/10/2021 08:23 AM   MICROALBUR 30 (H) 09/07/2021 10:10 AM   MICROALBUR 10 03/08/2021 09:33 AM    Kidney Function Lab Results  Component Value Date/Time   CREATININE 0.79 03/12/2022 08:35 AM   CREATININE 0.87 09/07/2021 10:12 AM   GFRNONAA >60 07/05/2021 10:28 AM   GFRAA >60 12/01/2019 08:15 AM   Current antihyperglycemic regimen:  Jardiance 25 mg take 1 tablet daily Metformin 500 mg take tablets twice daily Rybelsus 14 mg take 1 tablet daily  Patient verbally confirms he is taking the above medications as directed. Yes  What diet changes have been made to improve diabetes control?    Patient states he has not made any diet changes.  What recent interventions/DTPs have been made to improve glycemic control:  None noted  Have there been any recent hospitalizations or ED visits since last visit with PharmD? No  Patient denies hypoglycemic symptoms, including Pale, Sweaty, Shaky, Hungry, Nervous/irritable, and Vision changes  Patient denies hyperglycemic symptoms, including blurry vision, excessive thirst, fatigue, polyuria, and weakness  How often are you checking your blood sugar? twice daily  What are your blood sugars ranging?  Fasting: 120 Before meals:  After meals:  Bedtime: 130  During the week, how often does your blood glucose drop below 70? Never  Are you checking your feet daily/regularly? Yes   Adherence Review: Is the patient currently on a STATIN medication? Yes Is the patient currently on ACE/ARB medication? No Does the patient have >5 day gap between last estimated fill dates?  No  Star Rating Drugs:  Atorvastatin 10 mg Last filled:03/12/22 90 DS, 12/25/21 90 DS Jardiance 25 mg Last filled:02/17/22 90 DS, 11/21/21 90 DS Metformin 500 mg Last filled:03/05/22 90 DS, 12/03/21 90 DS Rybelsus 14 mg Last filled:03/13/22 90 DS, 12/10/21 90 DS  Care Gaps: Annual wellness visit in last year? Yes Last eye exam / retinopathy screening:08/29/21 Last diabetic foot exam:09/07/21  Patient was questioning about his Stiolto Inhaler not being received from the pharmacy. I contacted his pharmacy to see what the issues was and they stated they will have the medication ready for the patient on 04/18/21 after 11 am. The patient is aware of his medication and understood.   Corrie Mckusick, RMA

## 2022-05-07 NOTE — Progress Notes (Unsigned)
PROVIDER NOTE: Information contained herein reflects review and annotations entered in association with encounter. Interpretation of such information and data should be left to medically-trained personnel. Information provided to patient can be located elsewhere in the medical record under "Patient Instructions". Document created using STT-dictation technology, any transcriptional errors that may result from process are unintentional.    Patient: Kristopher [name]  Service Category: E/M  Provider: Gaspar Cola, MD  DOB: [birth year]  DOS: 05/08/2022  Referring Provider: [name], DO  MRN: [id number]  Specialty: Interventional Pain Management  PCP: [name], DO  Type: Established Patient  Setting: Ambulatory outpatient    Location: Office  Delivery: Face-to-face     HPI  Kristopher [name], a 62 y.o. year old male, is here today because of his No primary diagnosis found.. Kristopher [name] primary complain today is No chief complaint on file. Last encounter: My last encounter with him was on 02/06/2022. Pertinent problems: Kristopher [name] has Chronic low back pain (1ry area of Pain) (Bilateral) (L>R); Lumbar spondylosis; Chronic lumbar radicular pain (S1 dermatomal) (Left); 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); Chronic neck pain (posterior midline) (Bilateral) (L>R); Cervical spondylosis; Chronic cervical radicular pain (Bilateral) (C5/C6 dermatome) (L>R); Cervical spinal stenosis (C4-5); Cervical foraminal stenosis (Bilateral C5-6); Retrolisthesis of L5-S1; Cervical disc herniation (C4-5 and C5-6); Lumbar disc herniation (L5-S1); Chronic sacroiliac joint pain (Bilateral) (L>R); Chronic hip pain (Left); Lumbar facet syndrome (Bilateral) (L>R); Chronic lower extremity pain (2ry area of Pain) (Left); Greater occipital neuralgia (Right); Chronic pain syndrome; Migraine; Musculoskeletal pain, chronic; Left-sided weakness; Hip arthritis; S/P hip replacement; Squamous cell  carcinoma of left lung (HCC); and Chronic upper extremity pain (3ry area of Pain) (Bilateral) (L>R) on their pertinent problem list. Pain Assessment: Severity of   is reported as a  /10. Location:    / . Onset:  . Quality:  . Timing:  . Modifying factor(s):  [name] Vitals:  vitals were not taken for this visit.  BMI: Estimated body mass index is 33.07 kg/m as calculated from the following:   Height as of 03/12/22: '5\' 10"'$  (1.778 m).   Weight as of 03/12/22: 230 lb 8 oz (104.6 kg).  Reason for encounter: medication management. ***  Routine UDS ordered today.   RTCB: 08/13/2022   Pharmacotherapy Assessment  Analgesic: Morphine IR 15 mg, 1 tablet p.o. every 6 hours (60 mg/day of morphine) (60 MME) + Compounded Specialty Analgesic Cream (w/ Ketamine) MME/day: 60 mg/day.   Monitoring: [place] PMP: PDMP reviewed during this encounter.       Pharmacotherapy: No side-effects or adverse reactions reported. Compliance: No problems identified. Effectiveness: Clinically acceptable.  No notes on file  No results found for: "CBDTHCR" No results found for: "D8THCCBX" No results found for: "D9THCCBX"  UDS:  Summary  Date Value Ref Range Status  05/14/2021 Note  Final    Comment:    ==================================================================== [employer] Select 13 (MW) ==================================================================== Test                             Result       Flag       Units  Drug Present and Declared for Prescription Verification   Morphine                       [id number]       EXPECTED   ng/mg creat   Normorphine  80           EXPECTED   ng/mg creat    Potential sources of large amounts of morphine in the absence of    codeine include administration of morphine or use of heroin.     Normorphine is an expected metabolite of morphine.  ==================================================================== Test                      Result    Flag   Units       Ref Range   Creatinine              81               mg/dL      >=20 ==================================================================== Declared Medications:  The flagging and interpretation on this report are based on the  following declared medications.  Unexpected results may arise from  inaccuracies in the declared medications.   **Note: The testing scope of this panel includes these medications:   Morphine (MSIR)   **Note: The testing scope of this panel does not include the  following reported medications:   Albuterol (Ventolin HFA)  Amlodipine (Norvasc)  Atorvastatin (Lipitor)  Budesonide (Breztri Aerosphere)  Diphenhydramine (Benadryl)  Empagliflozin (Jardiance)  Esomeprazole (Nexium)  Formoterol (Breztri Aerosphere)  Glycopyrrolate (Breztri Aerosphere)  Hydralazine (Apresoline)  Metformin (Glucophage)  Metoprolol (Toprol)  Montelukast (Singulair)  Multivitamin  Nortriptyline (Pamelor)  Olodaterol (Stiolto Respimat)  Potassium  Sumatriptan (Imitrex)  Tiotropium (Stiolto Respimat)  Topical Cyclobenzaprine  Topical Gabapentin  Topical Ketamine  Topical Lidocaine (Lidoderm) ==================================================================== For clinical consultation, please call [phone]. ====================================================================       ROS  Constitutional: Denies any fever or chills Gastrointestinal: No reported hemesis, hematochezia, vomiting, or acute GI distress Musculoskeletal: Denies any acute onset joint swelling, redness, loss of ROM, or weakness Neurological: No reported episodes of acute onset apraxia, aphasia, dysarthria, agnosia, amnesia, paralysis, loss of coordination, or loss of consciousness  Medication Review  Accu-Chek FastClix Lancets, Budeson-Glycopyrrol-Formoterol, NONFORMULARY OR COMPOUNDED ITEM, Potassium Chloride ER, SUMAtriptan, Semaglutide, Tiotropium Bromide-Olodaterol, albuterol, amLODipine,  atorvastatin, diphenhydrAMINE, empagliflozin, esomeprazole, glucose blood, hydrALAZINE, lidocaine, metFORMIN, metoprolol succinate, metoprolol tartrate, montelukast, morphine, multivitamin with minerals, naloxone, and nortriptyline  History Review  Allergy: Kristopher [name] is allergic to acetaminophen, aspirin, epinephrine, novocain [procaine], penicillins, strawberry extract, and shellfish allergy. Drug: Kristopher [name]  reports no history of drug use. Alcohol:  reports no history of alcohol use. Tobacco:  reports that he quit smoking about 3 years ago. His smoking use included cigarettes. He has a 8.75 pack-year smoking history. He has never used smokeless tobacco. Social: Kristopher [name]  reports that he quit smoking about 3 years ago. His smoking use included cigarettes. He has a 8.75 pack-year smoking history. He has never used smokeless tobacco. He reports that he does not drink alcohol and does not use drugs. Medical:  has a past medical history of Allergy, Arthritis, Benign hypertensive kidney disease, Chronic back pain, Diabetes mellitus, type 2 ([place]), Dyspnea, GERD (gastroesophageal reflux disease), Hypertension, Malignant neoplasm of lung ([place]), and Migraines. Surgical: Kristopher [name]  has a past surgical history that includes Back surgery; Appendectomy; Leg Surgery; Knee surgery (Left); Foot surgery (Left); Colonoscopy with propofol (N/A, 02/19/2016); polypectomy (N/A, 02/19/2016); Total hip arthroplasty (Left, 12/02/2017); Colonoscopy with propofol (N/A, 01/19/2018); polypectomy (01/19/2018); DG OPERATIVE LEFT HIP ([place] HX); Joint replacement (Left, 12/2017); Video bronchoscopy (Left, 01/14/2019); Thoracotomy (Left, 01/14/2019); Flexible bronchoscopy (Bilateral, 01/20/2019); Flexible bronchoscopy (Bilateral, 01/22/2019); Electormagnetic navigation bronchoscopy (Left, 11/18/2018); Lung cancer  surgery; Video bronchoscopy with endobronchial ultrasound (N/A, 10/15/2019); Video bronchoscopy with endobronchial navigation  (N/A, 10/15/2019); Portacath placement (N/A, 11/11/2019); Colonoscopy with propofol (N/A, 03/16/2021); and Colonoscopy with propofol (N/A, 04/06/2021). Family: family history includes Cancer in his father; Diabetes in his sister; Thrombosis in his sister.  Laboratory Chemistry Profile   Renal Lab Results  Component Value Date   BUN 11 03/12/2022   CREATININE 0.79 03/12/2022   BCR 14 03/12/2022   GFRAA >60 12/01/2019   GFRNONAA >60 07/05/2021    Hepatic Lab Results  Component Value Date   AST 48 (H) 03/12/2022   ALT 53 (H) 03/12/2022   ALBUMIN 4.6 03/12/2022   ALKPHOS 175 (H) 03/12/2022   LIPASE 50 02/17/2019    Electrolytes Lab Results  Component Value Date   NA 142 03/12/2022   K 3.8 03/12/2022   CL 102 03/12/2022   CALCIUM 9.4 03/12/2022   MG 2.4 01/25/2019   PHOS 3.5 01/23/2019    Bone No results found for: "VD25OH", "VD125OH2TOT", "[id number]", "[id number]", "25OHVITD1", "25OHVITD2", "[id number]", "TESTOFREE", "TESTOSTERONE"  Inflammation (CRP: Acute Phase) (ESR: Chronic Phase) Lab Results  Component Value Date   CRP 0.7 04/03/2015   ESRSEDRATE 11 10/21/2017   LATICACIDVEN 1.7 01/16/2019         Note: Above Lab results reviewed.  Recent Imaging Review  CT CHEST W CONTRAST CLINICAL DATA:  Follow-up lung cancer. Status post left lower lobectomy.  EXAM: CT CHEST WITH CONTRAST  TECHNIQUE: Multidetector CT imaging of the chest was performed during intravenous contrast administration.  RADIATION DOSE REDUCTION: This exam was performed according to the departmental dose-optimization program which includes automated exposure control, adjustment of the mA and/or kV according to patient size and/or use of iterative reconstruction technique.   CONTRAST:  1m OMNIPAQUE IOHEXOL 300 MG/ML  SOLN  COMPARISON:  07/03/2021  FINDINGS: Cardiovascular: Aortic atherosclerotic calcifications. Normal heart size. No pericardial effusion.  Mediastinum/Nodes: No enlarged  mediastinal, hilar, or axillary lymph nodes. Thyroid gland, trachea, and esophagus demonstrate no significant findings.  Lungs/Pleura: No pleural effusion identified. Status post left lower lobectomy. Again noted is increased left perihilar soft tissue with surrounding fibrotic changes which is presumed to represent changes secondary to external beam radiation. On today's study this area measures 5.4 cm in maximum AP dome in shin and 1.3 cm transverse dimension. On the previous exam this measured 6 cm in AP dimension and 1.5 cm transverse.  Subpleural nodule within the posterior right upper lobe is stable measuring 4 mm, image 38/3. Stable faint nodule in the posterior right upper lung measuring 2 mm, image 35/3.  Upper Abdomen: No acute abnormality. No adrenal mass identified. Calcified granulomas identified within the spleen.  Musculoskeletal: No chest wall abnormality. No acute or significant osseous findings.  IMPRESSION: 1. Status post left lower lobectomy. There is increased left perihilar soft tissue with surrounding fibrotic changes which is presumed to represent changes secondary to external beam radiation. This is stable when compared with the study from 07/03/2021. 2. Stable small nonspecific pulmonary nodules. 3. Aortic Atherosclerosis (ICD10-I70.0).  Electronically Signed   By: TKerby MoorsM.D.   On: 11/07/2021 06:23 Note: Reviewed        Physical Exam  General appearance: Well nourished, well developed, and well hydrated. In no apparent acute distress Mental status: Alert, oriented x 3 (person, place, & time)       Respiratory: No evidence of acute respiratory distress Eyes: PERLA Vitals: There were no vitals taken for this visit. BMI: Estimated body mass index  is 33.07 kg/m as calculated from the following:   Height as of 03/12/22: '5\' 10"'$  (1.778 m).   Weight as of 03/12/22: 230 lb 8 oz (104.6 kg). Ideal: Patient weight not recorded  Assessment    Diagnosis Status  1. Chronic pain syndrome   2. Pharmacologic therapy   3. Encounter for medication management   4. 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)   5. Encounter for chronic pain management   6. Squamous cell carcinoma of left lung ([place])   7. Chronic lower extremity pain (2ry area of Pain) (Left)   8. Chronic upper extremity pain (3ry area of Pain) (Bilateral) (L>R)   9. Chronic use of opiate for therapeutic purpose   10. Chronic low back pain (1ry area of Pain) (Bilateral) (L>R)    Controlled Controlled Controlled   Updated Problems: No problems updated.  Plan of Care  Problem-specific:  No problem-specific Assessment & Plan notes found for this encounter.  Kristopher [name] has a current medication list which includes the following long-term medication(s): albuterol, albuterol, amlodipine, atorvastatin, esomeprazole, hydralazine, metformin, metoprolol succinate, metoprolol tartrate, montelukast, morphine, nortriptyline, potassium chloride er, and sumatriptan.  Pharmacotherapy (Medications Ordered): No orders of the defined types were placed in this encounter.  Orders:  No orders of the defined types were placed in this encounter.  Follow-up plan:   No follow-ups on file.      Interventional Therapies  Risk Factors  Complex Considerations:   High Risk for SUD.    Planned  Pending:   None at this time. Claims to have anaphylactic allergy to Local Anesthetics. He was sent for testing and shown/proven not to be allergic to Lidocaine.    Under consideration:   None   Completed:   We have now called his compounding pharmacy twice, one in 2022 and on 08/13/2021 and they have confirmed that they do on her day 1 year refill prescription for his nonformulary cream.  The last prescription was sent to them on 07/31/2021 and should be good until 07/31/2022.  They have confirmed on 08/13/2021 that they are in position of this prescription. On  the patient's initial evaluation he claims to have an anaphylactic allergy to lidocaine.  The patient was sent to [facility] dermatology where he was skin tested and found to have absolutely no allergies to lidocaine.  Neither ropivacaine (Naropin) nor bupivacaine (Sensorcaine) were tested.   Therapeutic  Palliative (PRN) options:   None.   Pharmacotherapy:  Compounded medication was transferred on 02/06/2022.  At that time, he still had enough refills to last until 07/31/2022. Recommendations:   None at this time.       Recent Visits Date Type Provider Dept  02/06/22 Office Visit [name], MD [facility]  Showing recent visits within past 90 days and meeting all other requirements Future Appointments Date Type Provider Dept  05/08/22 Appointment [name], MD [facility]  Showing future appointments within next 90 days and meeting all other requirements  I discussed the assessment and treatment plan with the patient. The patient was provided an opportunity to ask questions and all were answered. The patient agreed with the plan and demonstrated an understanding of the instructions.  Patient advised to call back or seek an in-person evaluation if the symptoms or condition worsens.  Duration of encounter: *** minutes.  Total time on encounter, as per AMA guidelines included both the face-to-face and non-face-to-face time personally spent by the physician and/or other qualified health  care professional(s) on the day of the encounter (includes time in activities that require the physician or other qualified health care professional and does not include time in activities normally performed by clinical staff). Physician's time may include the following activities when performed: Preparing to see the patient (e.g., pre-charting review of records, searching for previously ordered imaging, lab work, and nerve conduction tests) Review of prior analgesic  pharmacotherapies. Reviewing PMP Interpreting ordered tests (e.g., lab work, imaging, nerve conduction tests) Performing post-procedure evaluations, including interpretation of diagnostic procedures Obtaining and/or reviewing separately obtained history Performing a medically appropriate examination and/or evaluation Counseling and educating the patient/family/caregiver Ordering medications, tests, or procedures Referring and communicating with other health care professionals (when not separately reported) Documenting clinical information in the electronic or other health record Independently interpreting results (not separately reported) and communicating results to the patient/ family/caregiver Care coordination (not separately reported)  Note by: Gaspar Cola, MD Date: 05/08/2022; Time: 1:12 PM

## 2022-05-07 NOTE — Patient Instructions (Signed)
____________________________________________________________________________________________  Patient Information update  To: All of our patients.  Re: Name change.  It has been made official that our current name, "[name]"   will soon be changed to "[name]".   The purpose of this change is to eliminate any confusion created by the concept of our practice being a "Medication Management Pain Clinic". In the past this has led to the misconception that we treat pain primarily by the use of prescription medications.  Nothing can be farther from the truth.   Understanding PAIN MANAGEMENT: To further understand what our practice does, you first have to understand that "Pain Management" is a subspecialty that requires additional training once a physician has completed their specialty training, which can be in either Anesthesia, Neurology, Psychiatry, or Physical Medicine and Rehabilitation (PMR). Each one of these contributes to the final approach taken by each physician to the management of their patient's pain. To be a "Pain Management Specialist" you must have first completed one of the specialty trainings below.  Anesthesiologists - trained in clinical pharmacology and interventional techniques such as nerve blockade and regional as well as central neuroanatomy. They are trained to block pain before, during, and after surgical interventions.  Neurologists - trained in the diagnosis and pharmacological treatment of complex neurological conditions, such as Multiple Sclerosis, Parkinson's, spinal cord injuries, and other systemic conditions that may be associated with symptoms that may include but are not limited to pain. They tend to rely primarily on the treatment of chronic pain using prescription medications.  Psychiatrist - trained in conditions affecting the psychosocial  wellbeing of patients including but not limited to depression, anxiety, schizophrenia, personality disorders, addiction, and other substance use disorders that may be associated with chronic pain. They tend to rely primarily on the treatment of chronic pain using prescription medications.   Physical Medicine and Rehabilitation (PMR) physicians, also known as physiatrists - trained to treat a wide variety of medical conditions affecting the brain, spinal cord, nerves, bones, joints, ligaments, muscles, and tendons. Their training is primarily aimed at treating patients that have suffered injuries that have caused severe physical impairment. Their training is primarily aimed at the physical therapy and rehabilitation of those patients. They may also work alongside orthopedic surgeons or neurosurgeons using their expertise in assisting surgical patients to recover after their surgeries.  INTERVENTIONAL PAIN MANAGEMENT is sub-subspecialty of Pain Management.  Our physicians are Board-certified in Anesthesia, Pain Management, and Interventional Pain Management.  This meaning that not only have they been trained and Board-certified in their specialty of Anesthesia, and subspecialty of Pain Management, but they have also received further training in the sub-subspecialty of Interventional Pain Management, in order to become Board-certified as INTERVENTIONAL PAIN MANAGEMENT SPECIALIST.    Mission: Our goal is to use our skills in  [place] as alternatives to the chronic use of prescription opioid medications for the treatment of pain. To make this more clear, we have changed our name to reflect what we do and offer. We will continue to offer medication management assessment and recommendations, but we will not be taking over any patient's medication management.  ____________________________________________________________________________________________      ____________________________________________________________________________________________  Opioid Pain Medication Update  To: All patients taking opioid pain medications. (I.e.: hydrocodone, hydromorphone, oxycodone, oxymorphone, morphine, codeine, methadone, tapentadol, tramadol, buprenorphine, fentanyl, etc.)  Re: Updated review of side effects and adverse reactions of opioid analgesics, as well as new  information about long term effects of this class of medications.  Direct risks of long-term opioid therapy are not limited to opioid addiction and overdose. Potential medical risks include serious fractures, breathing problems during sleep, hyperalgesia, immunosuppression, chronic constipation, bowel obstruction, myocardial infarction, and tooth decay secondary to xerostomia.  Unpredictable adverse effects that can occur even if you take your medication correctly: Cognitive impairment, respiratory depression, and death. Most people think that if they take their medication "correctly", and "as instructed", that they will be safe. Nothing could be farther from the truth. In reality, a significant amount of recorded deaths associated with the use of opioids has occurred in individuals that had taken the medication for a long time, and were taking their medication correctly. The following are examples of how this can happen: Patient taking his/her medication for a long time, as instructed, without any side effects, is given a certain antibiotic or another unrelated medication, which in turn triggers a "Drug-to-drug interaction" leading to disorientation, cognitive impairment, impaired reflexes, respiratory depression or an untoward event leading to serious bodily harm or injury, including death.  Patient taking his/her medication for a long time, as instructed, without any side effects, develops an acute impairment of liver and/or kidney function. This will lead to a rapid inability of the body to  breakdown and eliminate their pain medication, which will result in effects similar to an "overdose", but with the same medicine and dose that they had always taken. This again may lead to disorientation, cognitive impairment, impaired reflexes, respiratory depression or an untoward event leading to serious bodily harm or injury, including death.  A similar problem will occur with patients as they grow older and their liver and kidney function begins to decrease as part of the aging process.  Background information: Historically, the original case for using long-term opioid therapy to treat chronic noncancer pain was based on safety assumptions that subsequent experience has called into question. In 1996, the American Pain Society and the [place] [employer] issued a consensus statement supporting long-term opioid therapy. This statement acknowledged the dangers of opioid prescribing but concluded that the risk for addiction was low; respiratory depression induced by opioids was short-lived, occurred mainly in opioid-naive patients, and was antagonized by pain; tolerance was not a common problem; and efforts to control diversion should not constrain opioid prescribing. This has now proven to be wrong. Experience regarding the risks for opioid addiction, misuse, and overdose in community practice has failed to support these assumptions.  According to the Centers for Disease Control and Prevention, fatal overdoses involving opioid analgesics have increased sharply over the past decade. Currently, more than 96,700 people die from drug overdoses every year. Opioids are a factor in 7 out of every 10 overdose deaths. Deaths from drug overdose have surpassed motor vehicle accidents as the leading cause of death for individuals between the ages of 26 and 110.  Clinical data suggest that neuroendocrine dysfunction may be very common in both men and women, potentially causing hypogonadism, erectile  dysfunction, infertility, decreased libido, osteoporosis, and depression. Recent studies linked higher opioid dose to increased opioid-related mortality. Controlled observational studies reported that long-term opioid therapy may be associated with increased risk for cardiovascular events. Subsequent meta-analysis concluded that the safety of long-term opioid therapy in elderly patients has not been proven.   Side Effects and adverse reactions: Common side effects: Drowsiness (sedation). Dizziness. Nausea and vomiting. Constipation. Physical dependence -- Dependence often manifests with withdrawal symptoms when opioids are discontinued  or decreased. Tolerance -- As you take repeated doses of opioids, you require increased medication to experience the same effect of pain relief. Respiratory depression -- This can occur in healthy people, especially with higher doses. However, people with COPD, asthma or other lung conditions may be even more susceptible to fatal respiratory impairment.  Uncommon side effects: An increased sensitivity to feeling pain and extreme response to pain (hyperalgesia). Chronic use of opioids can lead to this. Delayed gastric emptying (the process by which the contents of your stomach are moved into your small intestine). Muscle rigidity. Immune system and hormonal dysfunction. Quick, involuntary muscle jerks (myoclonus). Arrhythmia. Itchy skin (pruritus). Dry mouth (xerostomia).  Long-term side effects: Chronic constipation. Sleep-disordered breathing (SDB). Increased risk of bone fractures. Hypothalamic-pituitary-adrenal dysregulation. Increased risk of overdose.  RISKS: Fractures and Falls:  Opioids increase the risk and incidence of falls. This is of particular importance in elderly patients.  Endocrine System:  Long-term administration is associated with endocrine abnormalities (endocrinopathies). (Also known as Opioid-induced Endocrinopathy) Influences  on both the hypothalamic-pituitary-adrenal axis?and the hypothalamic-pituitary-gonadal axis have been demonstrated with consequent hypogonadism and adrenal insufficiency in both sexes. Hypogonadism and decreased levels of dehydroepiandrosterone sulfate have been reported in men and women. Endocrine effects include: Amenorrhoea in women (abnormal absence of menstruation) Reduced libido in both sexes Decreased sexual function Erectile dysfunction in men Hypogonadisms (decreased testicular function with shrinkage of testicles) Infertility Depression and fatigue Loss of muscle mass Anxiety Depression Immune suppression Hyperalgesia Weight gain Anemia Osteoporosis Patients (particularly women of childbearing age) should avoid opioids. There is insufficient evidence to recommend routine monitoring of asymptomatic patients taking opioids in the long-term for hormonal deficiencies.  Immune System: Human studies have demonstrated that opioids have an immunomodulating effect. These effects are mediated via opioid receptors both on immune effector cells and in the central nervous system. Opioids have been demonstrated to have adverse effects on antimicrobial response and anti-tumour surveillance. Buprenorphine has been demonstrated to have no impact on immune function.  Opioid Induced Hyperalgesia: Human studies have demonstrated that prolonged use of opioids can lead to a state of abnormal pain sensitivity, sometimes called opioid induced hyperalgesia (OIH). Opioid induced hyperalgesia is not usually seen in the absence of tolerance to opioid analgesia. Clinically, hyperalgesia may be diagnosed if the patient on long-term opioid therapy presents with increased pain. This might be qualitatively and anatomically distinct from pain related to disease progression or to breakthrough pain resulting from development of opioid tolerance. Pain associated with hyperalgesia tends to be more diffuse than the  pre-existing pain and less defined in quality. Management of opioid induced hyperalgesia requires opioid dose reduction.  Cancer: Chronic opioid therapy has been associated with an increased risk of cancer among noncancer patients with chronic pain. This association was more evident in chronic strong opioid users. Chronic opioid consumption causes significant pathological changes in the small intestine and colon. Epidemiological studies have found that there is a link between opium dependence and initiation of gastrointestinal cancers. Cancer is the second leading cause of death after cardiovascular disease. Chronic use of opioids can cause multiple conditions such as GERD, immunosuppression and renal damage as well as carcinogenic effects, which are associated with the incidence of cancers.   Mortality: Long-term opioid use has been associated with increased mortality among patients with chronic non-cancer pain (CNCP).  Prescription of long-acting opioids for chronic noncancer pain was associated with a significantly increased risk of all-cause mortality, including deaths from causes other than overdose.  Reference: Von Glenetta Hew,  Kolodny A, Deyo RA, Chou R. Long-term opioid therapy reconsidered. Ann Intern Med. 2011 Sep 6;155(5):325-8. doi: 10.7326/[id number]. PMID: [id number]; PMCIDXX:1631110. Morley Kos, Hayward RA, Dunn KM, [place] KP. Risk of adverse events in patients prescribed long-term opioids: A cohort study in the [place] Clinical Practice Research [employer]. Eur J Pain. 2019 May;23(5):908-922. doi: 10.1002/ejp.1357. Epub 2019 Jan 31. PMID: [id number]. Colameco S, Coren JS, [place]o CA. Continuous opioid treatment for chronic noncancer pain: a time for moderation in prescribing. Postgrad Med. 2009 Jul;121(4):61-6. doi: 10.3810/pgm.2009.07.2032. PMID: [id number]. Heywood Bene RN, Berwyn SD, Blazina I, Rosalio Loud, Bougatsos C, Deyo RA. The  effectiveness and risks of long-term opioid therapy for chronic pain: a systematic review for a [employer] of Health Pathways to [employer]. Ann Intern Med. 2015 Feb 17;162(4):276-86. doi: [id number]. PMID: [id number]. Marjory Sneddon [facility], [place] DM. NCHS Data Brief No. 22. [place]: Centers for Disease Control and Prevention; 2009. Sep, Increase in Fatal Poisonings Involving Opioid Analgesics in the [place], 1999-2006. Song IA, Choi HR, Oh TK. Long-term opioid use and mortality in patients with chronic non-cancer pain: Ten-year follow-up study in [place] from 2010 through 2019. EClinicalMedicine. 2022 Jul 18;51:101558. doi: 10.1016/j.eclinm.2022.UB:5887891. PMID: [id number]; PMCIDOX:8550940. Huser, W., Schubert, T., Vogelmann, T. et al. All-cause mortality in patients with long-term opioid therapy compared with non-opioid analgesics for chronic non-cancer pain: a database study. [place] Med 18, 162 (2020). [email or web address] Rashidian H, Roxy Cedar, Malekzadeh R, Haghdoost AA. An Ecological Study of the Association between Opiate Use and Incidence of Cancers. Addict Health. 2016 Fall;8(4):252-260. PMID: [id number]; PMCIDQI:9185013.  Our Goal: Our goal is to control your pain with means other than the use of opioid pain medications.  Our Recommendation: Talk to your physician about coming off of these medications. We can assist you with the tapering down and stopping these medicines. Based on the new information, even if you cannot completely stop the medication, a decrease in the dose may be associated with a lesser risk. Ask for other means of controlling the pain. Decrease or eliminate those factors that significantly contribute to your pain such as smoking, obesity, and a diet heavily tilted towards "inflammatory" nutrients.  Last Updated: 05/01/2022    ____________________________________________________________________________________________     ____________________________________________________________________________________________  National Pain Medication Shortage  The [place] is experiencing worsening drug shortages. These have had a negative widespread effect on patient care and treatment. Not expected to improve any time soon. Predicted to last past 2029.   Drug shortage list (generic names) Oxycodone IR Oxycodone/APAP Oxymorphone IR Hydromorphone Hydrocodone/APAP Morphine  Where is the problem?  Manufacturing and supply level.  Will this shortage affect you?  Only if you take any of the above pain medications.  How? You may be unable to fill your prescription.  Your pharmacist may offer a "partial fill" of your prescription. (Warning: Do not accept partial fills.) Prescriptions partially filled cannot be transferred to another pharmacy. Read our Medication Rules and Regulation. Depending on how much medicine you are dependent on, you may experience withdrawals when unable to get the medication.  Recommendations: Consider ending your dependence on opioid pain medications. Ask your pain specialist to assist you with the process. Consider switching to a medication currently not in shortage, such as Buprenorphine. Talk to your pain specialist about this option. Consider decreasing your pain medication requirements by managing tolerance thru "Drug Holidays". This may help minimize withdrawals, should you run out of medicine. Control your pain thru  the use of non-pharmacological interventional therapies.   Your prescriber: Prescribers cannot be blamed for shortages. Medication manufacturing and supply issues cannot be fixed by the prescriber.   NOTE: The prescriber is not responsible for supplying the medication, or solving supply issues. Work with your pharmacist to solve it. The patient is responsible for the decision  to take or continue taking the medication and for identifying and securing a legal supply source. By law, supplying the medication is the job and responsibility of the pharmacy. The prescriber is responsible for the evaluation, monitoring, and prescribing of these medications.   Prescribers will NOT: Re-issue prescriptions that have been partially filled. Re-issue prescriptions already sent to a pharmacy.  Re-send prescriptions to a different pharmacy because yours did not have your medication. Ask pharmacist to order more medicine or transfer the prescription to another pharmacy. (Read below.)  New 2023 regulation: "November 02, 2021 Revised Regulation Allows DEA-Registered Pharmacies to Transfer Electronic Prescriptions at a Patient's Request [place] Patients now have the ability to request their electronic prescription be transferred to another pharmacy without having to go back to their practitioner to initiate the request. This revised regulation went into effect on Monday, October 29, 2021.     At a patient's request, a DEA-registered retail pharmacy can now transfer an electronic prescription for a controlled substance (schedules II-V) to another DEA-registered retail pharmacy. Prior to this change, patients would have to go through their practitioner to cancel their prescription and have it re-issued to a different pharmacy. The process was taxing and time consuming for both patients and practitioners.    The Drug Enforcement Administration [facility]) published its intent to revise the process for transferring electronic prescriptions on January 21, 2020.  The final rule was published in the federal register on September 27, 2021 and went into effect 30 days later.  Under the final rule, a prescription can only be transferred once between pharmacies, and only if allowed under existing state or other applicable law. The prescription must remain in its  electronic form; may not be altered in any way; and the transfer must be communicated directly between two licensed pharmacists. It's important to note, any authorized refills transfer with the original prescription, which means the entire prescription will be filled at the same pharmacy".  Reference: [email or web address] [facility] website announcement)  [email or web address] ([place])   [employer] / Vol. 88, No. 143 / Thursday, September 27, 2021 / Rules and Regulations DEPARTMENT OF JUSTICE  Drug Enforcement Administration  21 CFR Part 1306  [Docket No. [id number]]  RIN [id number] Transfer of Electronic Prescriptions for Schedules II-V Controlled Substances Between Pharmacies for Initial Filling  ____________________________________________________________________________________________     ____________________________________________________________________________________________  Transfer of Pain Medication between Pharmacies  Re: 2023 DEA Clarification on existing regulation  Published on DEA Website: November 02, 2021  Title: Revised Regulation Allows DEA-Registered Pharmacies to Conservator, museum/gallery Prescriptions at a Patient's Request [place]  "Patients now have the ability to request their electronic prescription be transferred to another pharmacy without having to go back to their practitioner to initiate the request. This revised regulation went into effect on Monday, October 29, 2021.     At a patient's request, a DEA-registered retail pharmacy can now transfer an electronic prescription for a controlled substance (schedules II-V) to another DEA-registered retail pharmacy. Prior to this change, patients would have to go through their practitioner to cancel their  prescription  and have it re-issued to a different pharmacy. The process was taxing and time consuming for both patients and practitioners.    The Drug Enforcement Administration [facility]) published its intent to revise the process for transferring electronic prescriptions on January 21, 2020.  The final rule was published in the federal register on September 27, 2021 and went into effect 30 days later.  Under the final rule, a prescription can only be transferred once between pharmacies, and only if allowed under existing state or other applicable law. The prescription must remain in its electronic form; may not be altered in any way; and the transfer must be communicated directly between two licensed pharmacists. It's important to note, any authorized refills transfer with the original prescription, which means the entire prescription will be filled at the same pharmacy."    REFERENCES: 1. DEA website announcement [email or web address]  2. Department of Justice website  [email or web address].pdf  3. DEPARTMENT OF JUSTICE Drug Enforcement Administration 21 CFR Part 1306 [Docket No. [id number]] RIN [id number] "Transfer of Electronic Prescriptions for Schedules II-V Controlled Substances Between Pharmacies for Initial Filling"  ____________________________________________________________________________________________     _______________________________________________________________________  Medication Rules  Purpose: To inform patients, and their family members, of our medication rules and regulations.  Applies to: All patients receiving prescriptions from our practice (written or electronic).  Pharmacy of record: This is the pharmacy where your electronic prescriptions will be sent. Make sure we have the correct one.  Electronic prescriptions: In compliance  with the [place] (STOP) Act of 2017 (Session Lanny Cramp [phone]), effective March 04, 2018, all controlled substances must be electronically prescribed. Written prescriptions, faxing, or calling prescriptions to a pharmacy will no longer be done.  Prescription refills: These will be provided only during in-person appointments. No medications will be renewed without a "face-to-face" evaluation with your provider. Applies to all prescriptions.  NOTE: The following applies primarily to controlled substances (Opioid* Pain Medications).   Type of encounter (visit): For patients receiving controlled substances, face-to-face visits are required. (Not an option and not up to the patient.)  Patient's responsibilities: Pain Pills: Bring all pain pills to every appointment (except for procedure appointments). Pill Bottles: Bring pills in original pharmacy bottle. Bring bottle, even if empty. Always bring the bottle of the most recent fill.  Medication refills: You are responsible for knowing and keeping track of what medications you are taking and when is it that you will need a refill. The day before your appointment: write a list of all prescriptions that need to be refilled. The day of the appointment: give the list to the admitting nurse. Prescriptions will be written only during appointments. No prescriptions will be written on procedure days. If you forget a medication: it will not be "Called in", "Faxed", or "electronically sent". You will need to get another appointment to get these prescribed. No early refills. Do not call asking to have your prescription filled early. Partial  or short prescriptions: Occasionally your pharmacy may not have enough pills to fill your prescription.  NEVER ACCEPT a partial fill or a prescription that is short of the total amount of pills that you were prescribed.  With controlled substances the law allows 72 hours for the pharmacy  to complete the prescription.  If the prescription is not completed within 72 hours, the pharmacist will require a new prescription to be written. This means that you will be short on your medicine and we WILL NOT send another prescription to complete your original  prescription.  Instead, request the pharmacy to send a carrier to a nearby branch to get enough medication to provide you with your full prescription. Prescription Accuracy: You are responsible for carefully inspecting your prescriptions before leaving our office. Have the discharge nurse carefully go over each prescription with you, before taking them home. Make sure that your name is accurately spelled, that your address is correct. Check the name and dose of your medication to make sure it is accurate. Check the number of pills, and the written instructions to make sure they are clear and accurate. Make sure that you are given enough medication to last until your next medication refill appointment. Taking Medication: Take medication as prescribed. When it comes to controlled substances, taking less pills or less frequently than prescribed is permitted and encouraged. Never take more pills than instructed. Never take the medication more frequently than prescribed.  Inform other Doctors: Always inform, all of your healthcare providers, of all the medications you take. Pain Medication from other Providers: You are not allowed to accept any additional pain medication from any other Doctor or Healthcare provider. There are two exceptions to this rule. (see below) In the event that you require additional pain medication, you are responsible for notifying us, as stated below. Cough Medicine: Often these contain an opioid, such as codeine or hydrocodone. Never accept or take cough medicine containing these opioids if you are already taking an opioid* medication. The combination may cause respiratory failure and death. Medication Agreement: You are  responsible for carefully reading and following our Medication Agreement. This must be signed before receiving any prescriptions from our practice. Safely store a copy of your signed Agreement. Violations to the Agreement will result in no further prescriptions. (Additional copies of our Medication Agreement are available upon request.) Laws, Rules, & Regulations: All patients are expected to follow all Federal and [employer], [employer], Rules, [facility]. Ignorance of the Laws does not constitute a valid excuse.  Illegal drugs and Controlled Substances: The use of illegal substances (including, but not limited to marijuana and its derivatives) and/or the illegal use of any controlled substances is strictly prohibited. Violation of this rule may result in the immediate and permanent discontinuation of any and all prescriptions being written by our practice. The use of any illegal substances is prohibited. Adopted CDC guidelines & recommendations: Target dosing levels will be at or below 60 MME/day. Use of benzodiazepines** is not recommended.  Exceptions: There are only two exceptions to the rule of not receiving pain medications from other Healthcare Providers. Exception #1 (Emergencies): In the event of an emergency (i.e.: accident requiring emergency care), you are allowed to receive additional pain medication. However, you are responsible for: As soon as you are able, call our office [phone], at any time of the day or night, and leave a message stating your name, the date and nature of the emergency, and the name and dose of the medication prescribed. In the event that your call is answered by a member of our staff, make sure to document and save the date, time, and the name of the person that took your information.  Exception #2 (Planned Surgery): In the event that you are scheduled by another doctor or dentist to have any type of surgery or procedure, you are allowed (for a period no longer  than 30 days), to receive additional pain medication, for the acute post-op pain. However, in this case, you are responsible for picking up a copy of  our "Post-op Pain Management for Surgeons" handout, and giving it to your surgeon or dentist. This document is available at our office, and does not require an appointment to obtain it. Simply go to our office during business hours (Monday-Thursday from 8:00 AM to 4:00 PM) (Friday 8:00 AM to 12:00 Noon) or if you have a scheduled appointment with [place], prior to your surgery, and ask for it by name. In addition, you are responsible for: calling our office [phone], at any time of the day or night, and leaving a message stating your name, name of your surgeon, type of surgery, and date of procedure or surgery. Failure to comply with your responsibilities may result in termination of therapy involving the controlled substances. Medication Agreement Violation. Following the above rules, including your responsibilities will help you in avoiding a Medication Agreement Violation ("Breaking your Pain Medication Contract").  Consequences:  Not following the above rules may result in permanent discontinuation of medication prescription therapy.  *Opioid medications include: morphine, codeine, oxycodone, oxymorphone, hydrocodone, hydromorphone, meperidine, tramadol, tapentadol, buprenorphine, fentanyl, methadone. **Benzodiazepine medications include: diazepam (Valium), alprazolam (Xanax), clonazepam (Klonopine), lorazepam (Ativan), clorazepate (Tranxene), chlordiazepoxide (Librium), estazolam (Prosom), oxazepam (Serax), temazepam (Restoril), triazolam (Halcion) (Last updated: 12/25/2021) ______________________________________________________________________    ______________________________________________________________________  Medication Recommendations and Reminders  Applies to: All patients receiving prescriptions (written and/or  electronic).  Medication Rules & Regulations: You are responsible for reading, knowing, and following our "Medication Rules" document. These exist for your safety and that of others. They are not flexible and neither are we. Dismissing or ignoring them is an act of "non-compliance" that may result in complete and irreversible termination of such medication therapy. For safety reasons, "non-compliance" will not be tolerated. As with the [place] fundamental legal principle of "ignorance of the law is no defense", we will accept no excuses for not having read and knowing the content of documents provided to you by our practice.  Pharmacy of record:  Definition: This is the pharmacy where your electronic prescriptions will be sent.  We do not endorse any particular pharmacy. It is up to you and your insurance to decide what pharmacy to use.  We do not restrict you in your choice of pharmacy. However, once we write for your prescriptions, we will NOT be re-sending more prescriptions to fix restricted supply problems created by your pharmacy, or your insurance.  The pharmacy listed in the electronic medical record should be the one where you want electronic prescriptions to be sent. If you choose to change pharmacy, simply notify our nursing staff. Changes will be made only during your regular appointments and not over the phone.  Recommendations: Keep all of your pain medications in a safe place, under lock and key, even if you live alone. We will NOT replace lost, stolen, or damaged medication. We do not accept "Police Reports" as proof of medications having been stolen. After you fill your prescription, take 1 week's worth of pills and put them away in a safe place. You should keep a separate, properly labeled bottle for this purpose. The remainder should be kept in the original bottle. Use this as your primary supply, until it runs out. Once it's gone, then you know that you have 1 week's worth of medicine,  and it is time to come in for a prescription refill. If you do this correctly, it is unlikely that you will ever run out of medicine. To make sure that the above recommendation works, it is very important that you make  sure your medication refill appointments are scheduled at least 1 week before you run out of medicine. To do this in an effective manner, make sure that you do not leave the office without scheduling your next medication management appointment. Always ask the nursing staff to show you in your prescription , when your medication will be running out. Then arrange for the receptionist to get you a return appointment, at least 7 days before you run out of medicine. Do not wait until you have 1 or 2 pills left, to come in. This is very poor planning and does not take into consideration that we may need to cancel appointments due to bad weather, sickness, or emergencies affecting our staff. DO NOT ACCEPT A "Partial Fill": If for any reason your pharmacy does not have enough pills/tablets to completely fill or refill your prescription, do not allow for a "partial fill". The law allows the pharmacy to complete that prescription within 72 hours, without requiring a new prescription. If they do not fill the rest of your prescription within those 72 hours, you will need a separate prescription to fill the remaining amount, which we will NOT provide. If the reason for the partial fill is your insurance, you will need to talk to the pharmacist about payment alternatives for the remaining tablets, but again, DO NOT ACCEPT A PARTIAL FILL, unless you can trust your pharmacist to obtain the remainder of the pills within 72 hours.  Prescription refills and/or changes in medication(s):  Prescription refills, and/or changes in dose or medication, will be conducted only during scheduled medication management appointments. (Applies to both, written and electronic prescriptions.) No refills on procedure days. No  medication will be changed or started on procedure days. No changes, adjustments, and/or refills will be conducted on a procedure day. Doing so will interfere with the diagnostic portion of the procedure. No phone refills. No medications will be "called into the pharmacy". No Fax refills. No weekend refills. No Holliday refills. No after hours refills.  Remember:  Business hours are:  Monday to Thursday 8:00 AM to 4:00 PM Provider's Schedule: [name], MD - Appointments are:  Medication management: Monday and Wednesday 8:00 AM to 4:00 PM Procedure day: Tuesday and Thursday 7:30 AM to 4:00 PM Gillis Santa, MD - Appointments are:  Medication management: Tuesday and Thursday 8:00 AM to 4:00 PM Procedure day: Monday and Wednesday 7:30 AM to 4:00 PM (Last update: 12/25/2021) ______________________________________________________________________    ____________________________________________________________________________________________  Drug Holidays  What is a "Drug Holiday"? Drug Holiday: is the name given to the process of slowly tapering down and temporarily stopping the pain medication for the purpose of decreasing or eliminating tolerance to the drug.  Benefits Improved effectiveness Decreased required effective dose Improved pain control End dependence on high dose therapy Decrease cost of therapy Uncovering "opioid-induced hyperalgesia". (OIH)  What is "opioid hyperalgesia"? It is a paradoxical increase in pain caused by exposure to opioids. Stopping the opioid pain medication, contrary to the expected, it actually decreases or completely eliminates the pain. Ref.: "A comprehensive review of opioid-induced hyperalgesia". Brion Aliment, et.al. Pain Physician. 2011 Mar-Apr;14(2):145-61.  What is tolerance? Tolerance: the progressive loss of effectiveness of a pain medicine due to repetitive use. A common problem of opioid pain medications.  How long should a "Drug  Holiday" last? Effectiveness depends on the patient staying off all opioid pain medicines for a minimum of 14 consecutive days. (2 weeks)  How about just taking less of the medicine? Does not  work. Will not accomplish goal of eliminating the excess receptors.  How about switching to a different pain medicine? (AKA. "Opioid rotation") Does not work. Creates the illusion of effectiveness by taking advantage of inaccurate equivalent dose calculations between different opioids. -This "technique" was promoted by studies funded by [employer], such as [employer], creators of "OxyContin".  Can I stop the medicine "cold [place]"? Depends. You should always coordinate with your Pain Specialist to make the transition as smoothly as possible. Avoid stopping the medicine abruptly without consulting. We recommend a "slow taper".  What is a slow taper? Taper: refers to the gradual decrease in dose.   How do I stop/taper the dose? Slowly. Decrease the daily amount of pills that you take by one (1) pill every seven (7) days. This is called a "slow downward taper". Example: if you normally take four (4) pills per day, drop it to three (3) pills per day for seven (7) days, then to two (2) pills per day for seven (7) days, then to one (1) per day for seven (7) days, and then stop the medicine. The 14 day "Drug Holiday" starts on the first day without medicine.   Will I experience withdrawals? Unlikely with a slow taper.  What triggers withdrawals? Withdrawals are triggered by the sudden/abrupt stop of high dose opioids. Withdrawals can be avoided by slowly decreasing the dose over a prolonged period of time.  What are withdrawals? Symptoms associated with sudden/abrupt reduction/stopping of high-dose, long-term use of pain medication. Withdrawal are seldom seen on low dose therapy, or patients rarely taking opioid medication.  Early Withdrawal Symptoms may include: Agitation Anxiety Muscle  aches Increased tearing Insomnia Runny nose Sweating Yawning  Late symptoms may include: Abdominal cramping Diarrhea Dilated pupils Goose bumps Nausea Vomiting  (Last update: 02/10/2022) ____________________________________________________________________________________________    ____________________________________________________________________________________________  WARNING: CBD (cannabidiol) & Delta (Delta-8 tetrahydrocannabinol) products.   Applicable to:  All individuals currently taking or considering taking CBD (cannabidiol) and, more important, all patients taking opioid analgesic controlled substances (pain medication). (Example: oxycodone; oxymorphone; hydrocodone; hydromorphone; morphine; methadone; tramadol; tapentadol; fentanyl; buprenorphine; butorphanol; dextromethorphan; meperidine; codeine; etc.)  Introduction:  Recently there has been a drive towards the use of "natural" products for the treatment of different conditions, including pain anxiety and sleep disorders. Marijuana and hemp are two varieties of the cannabis genus plants. Marijuana and its derivatives are illegal, while hemp and its derivatives are not. Cannabidiol (CBD) and tetrahydrocannabinol (THC), are two natural compounds found in plants of the Cannabis genus. They can both be extracted from hemp or marijuana. Both compounds interact with your body's endocannabinoid system in very different ways. CBD is associated with pain relief (analgesia) while THC is associated with the psychoactive effects ("the high") obtained from the use of marijuana products. There are two main types of THC: Delta-9, which comes from the marijuana plant and it is illegal, and Delta-8, which comes from the hemp plant, and it is legal. (Both, Delta-9-THC and Delta-8-THC are psychoactive and give you "the high".)   Legality:  Marijuana and its derivatives: illegal Hemp and its derivatives: Legal (State dependent) UPDATE:  (04/20/2021) The Drug Enforcement Agency ([place]) issued a letter stating that "delta" cannabinoids, including Delta-8-THCO and Delta-9-THCO, synthetically derived from hemp do not qualify as hemp and will be viewed as Schedule I drugs. (Schedule I drugs, substances, or chemicals are defined as drugs with no currently accepted medical use and a high potential for abuse. Some examples of Schedule I drugs are: heroin,  lysergic acid diethylamide (LSD), marijuana (cannabis), 3,4-methylenedioxymethamphetamine (ecstasy), methaqualone, and peyote.) (https://jennings.com/)  Legal status of CBD in [place]:  "Conditionally Legal"  Reference: "FDA Regulation of Cannabis and Cannabis-Derived Products, Including Cannabidiol (CBD)" - SeekArt[employer].com.pt  Warning:  CBD is not FDA approved and has not undergo the same manufacturing controls as prescription drugs.  This means that the purity and safety of available CBD may be questionable. Most of the time, despite manufacturer's claims, it is contaminated with THC (delta-9-tetrahydrocannabinol - the chemical in marijuana responsible for the "HIGH").  When this is the case, the [facility] contaminant will trigger a positive urine drug screen (UDS) test for Marijuana (carboxy-THC).   The FDA recently put out a warning about 5 things that everyone should be aware of regarding Delta-8 THC: Delta-8 THC products have not been evaluated or approved by the FDA for safe use and may be marketed in ways that put the public health at risk. The FDA has received adverse event reports involving delta-8 THC-containing products. Delta-8 THC has psychoactive and intoxicating effects. Delta-8 THC manufacturing often involve use of potentially harmful chemicals to create the concentrations of delta-8 THC claimed in the marketplace. The final delta-8 THC product may have potentially harmful  by-products (contaminants) due to the chemicals used in the process. Manufacturing of delta-8 THC products may occur in uncontrolled or unsanitary settings, which may lead to the presence of unsafe contaminants or other potentially harmful substances. Delta-8 THC products should be kept out of the reach of children and pets.  NOTE: Because a positive UDS for any illicit substance is a violation of our medication agreement, your opioid analgesics (pain medicine) may be permanently discontinued.  MORE ABOUT CBD  General Information: CBD was discovered in [birth year] and it is a derivative of the cannabis sativa genus plants (Marijuana and Hemp). It is one of the 113 identified substances found in Marijuana. It accounts for up to 40% of the plant's extract. As of 2018, preliminary clinical studies on CBD included research for the treatment of anxiety, movement disorders, and pain. CBD is available and consumed in multiple forms, including inhalation of smoke or vapor, as an aerosol spray, and by mouth. It may be supplied as an oil containing CBD, capsules, dried cannabis, or as a liquid solution. CBD is thought not to be as psychoactive as THC (delta-9-tetrahydrocannabinol - the chemical in marijuana responsible for the "HIGH"). Studies suggest that CBD may interact with different biological target receptors in the body, including cannabinoid and other neurotransmitter receptors. As of 2018 the mechanism of action for its biological effects has not been determined.  Side-effects  Adverse reactions: Dry mouth, diarrhea, decreased appetite, fatigue, drowsiness, malaise, weakness, sleep disturbances, and others.  Drug interactions:  CBD may interact with medications such as blood-thinners. CBD causes drowsiness on its own and it will increase drowsiness caused by other medications, including antihistamines (such as Benadryl), benzodiazepines (Xanax, Ativan, Valium), antipsychotics, antidepressants, opioids, alcohol  and supplements such as kava, melatonin and St. John's Wort.  Other drug interactions: Brivaracetam (Briviact); Caffeine; Carbamazepine (Tegretol); Citalopram (Celexa); Clobazam (Onfi); Eslicarbazepine (Aptiom); Everolimus (Zostress); Lithium; Methadone (Dolophine); Rufinamide (Banzel); Sedative medications (CNS depressants); Sirolimus (Rapamune); Stiripentol (Diacomit); Tacrolimus (Prograf); Tamoxifen ; Soltamox); Topiramate (Topamax); Valproate; Warfarin (Coumadin); Zonisamide. (Last update: 02/11/2022) ____________________________________________________________________________________________   ____________________________________________________________________________________________  Naloxone Nasal Spray  Why am I receiving this medication? [place] STOP ACT requires that all patients taking high dose opioids or at risk of opioids respiratory depression, be prescribed an opioid reversal agent, such as  Naloxone (AKA: Narcan).  What is this medication? NALOXONE (nal OX one) treats opioid overdose, which causes slow or shallow breathing, severe drowsiness, or trouble staying awake. Call emergency services after using this medication. You may need additional treatment. Naloxone works by reversing the effects of opioids. It belongs to a group of medications called opioid blockers.  COMMON BRAND NAME(S): Kloxxado, Narcan  What should I tell my care team before I take this medication? They need to know if you have any of these conditions: Heart disease Substance use disorder An unusual or allergic reaction to naloxone, other medications, foods, dyes, or preservatives Pregnant or trying to get pregnant Breast-feeding  When to use this medication? This medication is to be used for the treatment of respiratory depression (less than 8 breaths per minute) secondary to opioid overdose.   How to use this medication? This medication is for use in the nose. Lay the person on their back.  Support their neck with your hand and allow the head to tilt back before giving the medication. The nasal spray should be given into 1 nostril. After giving the medication, move the person onto their side. Do not remove or test the nasal spray until ready to use. Get emergency medical help right away after giving the first dose of this medication, even if the person wakes up. You should be familiar with how to recognize the signs and symptoms of a narcotic overdose. If more doses are needed, give the additional dose in the other nostril. Talk to your care team about the use of this medication in children. While this medication may be prescribed for children as young as newborns for selected conditions, precautions do apply.  Naloxone Overdosage: If you think you have taken too much of this medicine contact a poison control center or emergency room at once.  NOTE: This medicine is only for you. Do not share this medicine with others.  What if I miss a dose? This does not apply.  What may interact with this medication? This is only used during an emergency. No interactions are expected during emergency use. This list may not describe all possible interactions. Give your health care provider a list of all the medicines, herbs, non-prescription drugs, or dietary supplements you use. Also tell them if you smoke, drink alcohol, or use illegal drugs. Some items may interact with your medicine.  What should I watch for while using this medication? Keep this medication ready for use in the case of an opioid overdose. Make sure that you have the phone number of your care team and local hospital ready. You may need to have additional doses of this medication. Each nasal spray contains a single dose. Some emergencies may require additional doses. After use, bring the treated person to the nearest hospital or call 911. Make sure the treating care team knows that the person has received a dose of this medication.  You will receive additional instructions on what to do during and after use of this medication before an emergency occurs.  What side effects may I notice from receiving this medication? Side effects that you should report to your care team as soon as possible: Allergic reactions--skin rash, itching, hives, swelling of the face, lips, tongue, or throat Side effects that usually do not require medical attention (report these to your care team if they continue or are bothersome): Constipation Dryness or irritation inside the nose Headache Increase in blood pressure Muscle spasms Stuffy nose Toothache This list may not  describe all possible side effects. Call your doctor for medical advice about side effects. You may report side effects to FDA at 1-800-FDA-1088.  Where should I keep my medication? Because this is an emergency medication, you should keep it with you at all times.  Keep out of the reach of children and pets. Store between 20 and 25 degrees C (68 and 77 degrees F). Do not freeze. Throw away any unused medication after the expiration date. Keep in original box until ready to use.  NOTE: This sheet is a summary. It may not cover all possible information. If you have questions about this medicine, talk to your doctor, pharmacist, or health care provider.   2023 Elsevier/Gold Standard (2020-10-27 00:00:00)  ____________________________________________________________________________________________

## 2022-05-08 ENCOUNTER — Encounter: Payer: Self-pay | Admitting: Pain Medicine

## 2022-05-08 ENCOUNTER — Ambulatory Visit
Admission: RE | Admit: 2022-05-08 | Discharge: 2022-05-08 | Disposition: A | Discharge status: Home / Self Care | Payer: 59 | Source: Ambulatory Visit | Attending: Oncology | Admitting: Oncology

## 2022-05-08 ENCOUNTER — Ambulatory Visit (HOSPITAL_BASED_OUTPATIENT_CLINIC_OR_DEPARTMENT_OTHER): Payer: 59 | Admitting: Pain Medicine

## 2022-05-08 DIAGNOSIS — M5441 Lumbago with sciatica, right side: Secondary | ICD-10-CM

## 2022-05-08 DIAGNOSIS — C3492 Malignant neoplasm of unspecified part of left bronchus or lung: Secondary | ICD-10-CM

## 2022-05-08 DIAGNOSIS — M79601 Pain in right arm: Secondary | ICD-10-CM | POA: Diagnosis not present

## 2022-05-08 DIAGNOSIS — C4492 Squamous cell carcinoma of skin, unspecified: Secondary | ICD-10-CM | POA: Diagnosis not present

## 2022-05-08 DIAGNOSIS — M5442 Lumbago with sciatica, left side: Secondary | ICD-10-CM

## 2022-05-08 DIAGNOSIS — G8929 Other chronic pain: Secondary | ICD-10-CM

## 2022-05-08 DIAGNOSIS — G894 Chronic pain syndrome: Secondary | ICD-10-CM

## 2022-05-08 DIAGNOSIS — M79605 Pain in left leg: Secondary | ICD-10-CM | POA: Insufficient documentation

## 2022-05-08 DIAGNOSIS — M961 Postlaminectomy syndrome, not elsewhere classified: Secondary | ICD-10-CM | POA: Insufficient documentation

## 2022-05-08 DIAGNOSIS — M79602 Pain in left arm: Secondary | ICD-10-CM | POA: Diagnosis not present

## 2022-05-08 DIAGNOSIS — Z79899 Other long term (current) drug therapy: Secondary | ICD-10-CM | POA: Diagnosis not present

## 2022-05-08 DIAGNOSIS — R911 Solitary pulmonary nodule: Secondary | ICD-10-CM | POA: Diagnosis not present

## 2022-05-08 DIAGNOSIS — Z79891 Long term (current) use of opiate analgesic: Secondary | ICD-10-CM

## 2022-05-08 LAB — POCT I-STAT CREATININE: Creatinine, Ser: 0.7 mg/dL (ref 0.61–1.24)

## 2022-05-08 MED ORDER — NONFORMULARY OR COMPOUNDED ITEM: 1.0000 mL | Freq: Four times a day (QID) | 99 refills | Status: DC | PRN

## 2022-05-08 MED ORDER — IOHEXOL 300 MG/ML  SOLN
75.0000 mL | Freq: Once | INTRAMUSCULAR | Status: AC | PRN
  Administered 2022-05-08: 75 mL via INTRAVENOUS

## 2022-05-08 MED ORDER — MORPHINE SULFATE 15 MG PO TABS: 15.0000 mg | ORAL_TABLET | Freq: Four times a day (QID) | ORAL | 0 refills | Status: DC | PRN

## 2022-05-08 NOTE — Progress Notes (Signed)
Nursing Pain Medication Assessment:  Safety precautions to be maintained throughout the outpatient stay will include: orient to surroundings, keep bed in low position, maintain call bell within reach at all times, provide assistance with transfer out of bed and ambulation.  Medication Inspection Compliance: Pill count conducted under aseptic conditions, in front of the patient. Neither the pills nor the bottle was removed from the patient's sight at any time. Once count was completed pills were immediately returned to the patient in their original bottle.  Medication: Morphine IR Pill/Patch Count:  35 of 120 pills remain Pill/Patch Appearance: Markings consistent with prescribed medication Bottle Appearance: Standard pharmacy container. Clearly labeled. Filled Date: 2 / 14 / 2023 Last Medication intake:  TodaySafety precautions to be maintained throughout the outpatient stay will include: orient to surroundings, keep bed in low position, maintain call bell within reach at all times, provide assistance with transfer out of bed and ambulation.

## 2022-05-14 ENCOUNTER — Inpatient Hospital Stay: Payer: 59 | Attending: Oncology | Admitting: Oncology

## 2022-05-14 ENCOUNTER — Encounter: Payer: Self-pay | Admitting: Oncology

## 2022-05-14 VITALS — BP 128/87 | HR 65 | Temp 97.0°F | Resp 16 | Ht 70.0 in | Wt 226.0 lb

## 2022-05-14 DIAGNOSIS — G8929 Other chronic pain: Secondary | ICD-10-CM | POA: Insufficient documentation

## 2022-05-14 DIAGNOSIS — Z833 Family history of diabetes mellitus: Secondary | ICD-10-CM | POA: Diagnosis not present

## 2022-05-14 DIAGNOSIS — Z9049 Acquired absence of other specified parts of digestive tract: Secondary | ICD-10-CM | POA: Diagnosis not present

## 2022-05-14 DIAGNOSIS — Z8249 Family history of ischemic heart disease and other diseases of the circulatory system: Secondary | ICD-10-CM | POA: Insufficient documentation

## 2022-05-14 DIAGNOSIS — I129 Hypertensive chronic kidney disease with stage 1 through stage 4 chronic kidney disease, or unspecified chronic kidney disease: Secondary | ICD-10-CM | POA: Insufficient documentation

## 2022-05-14 DIAGNOSIS — M549 Dorsalgia, unspecified: Secondary | ICD-10-CM | POA: Insufficient documentation

## 2022-05-14 DIAGNOSIS — C3492 Malignant neoplasm of unspecified part of left bronchus or lung: Secondary | ICD-10-CM | POA: Diagnosis not present

## 2022-05-14 DIAGNOSIS — Z886 Allergy status to analgesic agent status: Secondary | ICD-10-CM | POA: Diagnosis not present

## 2022-05-14 DIAGNOSIS — Z880 Allergy status to penicillin: Secondary | ICD-10-CM | POA: Diagnosis not present

## 2022-05-14 DIAGNOSIS — Z87891 Personal history of nicotine dependence: Secondary | ICD-10-CM | POA: Insufficient documentation

## 2022-05-14 DIAGNOSIS — N181 Chronic kidney disease, stage 1: Secondary | ICD-10-CM | POA: Diagnosis not present

## 2022-05-14 DIAGNOSIS — C3432 Malignant neoplasm of lower lobe, left bronchus or lung: Secondary | ICD-10-CM | POA: Insufficient documentation

## 2022-05-14 DIAGNOSIS — Z809 Family history of malignant neoplasm, unspecified: Secondary | ICD-10-CM | POA: Insufficient documentation

## 2022-05-14 DIAGNOSIS — Z79899 Other long term (current) drug therapy: Secondary | ICD-10-CM | POA: Insufficient documentation

## 2022-05-14 DIAGNOSIS — E1122 Type 2 diabetes mellitus with diabetic chronic kidney disease: Secondary | ICD-10-CM | POA: Diagnosis not present

## 2022-05-14 NOTE — Progress Notes (Signed)
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  INTERVAL HISTORY: Patient returns to clinic today for routine 69-monthevaluation and discussion of his imaging results.  He continues to feel well and remains asymptomatic.  He has a good appetite and denies weight loss.  He has no neurologic complaints.  He denies any recent fevers or illnesses.  He has no chest pain, shortness of breath, cough, or hemoptysis.  He denies any nausea, vomiting, constipation, or diarrhea.  He has no urinary complaints.  Patient feels at his baseline and offers no specific complaints today.  REVIEW OF SYSTEMS:   Review of Systems  Constitutional: Negative.  Negative for fever, malaise/fatigue and weight loss.  Respiratory: Negative.  Negative for cough and shortness of breath.   Cardiovascular: Negative.  Negative for chest pain and leg swelling.  Gastrointestinal: Negative.  Negative for abdominal pain and heartburn.  Genitourinary: Negative.  Negative for dysuria and flank pain.  Musculoskeletal: Negative.  Negative for back pain.  Skin: Negative.  Negative for rash.  Neurological: Negative.  Negative for dizziness, focal weakness, weakness and headaches.  Psychiatric/Behavioral: Negative.  The patient is not nervous/anxious.     As per HPI. Otherwise, a complete review of systems is negative.  PAST MEDICAL HISTORY: Past Medical History:  Diagnosis Date    Allergy    Arthritis    left foot   Benign hypertensive kidney disease    Chronic back pain    Four rods in back   Diabetes mellitus, type 2 (HCC)    Dyspnea    GERD (gastroesophageal reflux disease)    Hypertension    Malignant neoplasm of lung (HCC)    Migraines    daily    PAST SURGICAL HISTORY: Past Surgical History:  Procedure Laterality Date   APPENDECTOMY     BACK SURGERY     COLONOSCOPY WITH PROPOFOL N/A 02/19/2016   Procedure: COLONOSCOPY WITH PROPOFOL;  Surgeon: DLucilla Lame MD;  [place] [facility]  Service: Endoscopy;  Laterality: N/A;   COLONOSCOPY WITH PROPOFOL N/A 01/19/2018   Procedure: COLONOSCOPY WITH PROPOFOL;  Surgeon: WLucilla Lame MD;  [place] [facility]  Service: Endoscopy;  Laterality: N/A;  Diabetic - oral meds   COLONOSCOPY WITH PROPOFOL N/A 03/16/2021   Procedure: COLONOSCOPY WITH PROPOFOL;  Surgeon: WLucilla Lame MD;  [facility]  Service: Endoscopy;  Laterality: N/A;  INADEQUATE PREP   COLONOSCOPY WITH PROPOFOL N/A 04/06/2021   Procedure: COLONOSCOPY WITH PROPOFOL;  Surgeon: WLucilla Lame MD;  [place] [facility]  Service: Endoscopy;  Laterality: N/A;   DG OPERATIVE LEFT HIP ([place]HX)     10/19   ELECTROMAGNETIC NAVIGATION BROCHOSCOPY Left 11/18/2018   Procedure: ELECTROMAGNETIC NAVIGATION BRONCHOSCOPY;  Surgeon: GTyler Pita MD;  Location: [facility];  Service: Cardiopulmonary;  Laterality: Left;   FLEXIBLE BRONCHOSCOPY Bilateral 01/20/2019   Procedure: FLEXIBLE BRONCHOSCOPY;  Surgeon: AOttie Glazier MD;  Location: [facility];  Service: Thoracic;  Laterality: Bilateral;   FLEXIBLE BRONCHOSCOPY Bilateral 01/22/2019   Procedure: FLEXIBLE BRONCHOSCOPY;  Surgeon: Ottie Glazier, MD;  Location: [facility];  Service: Thoracic;  Laterality: Bilateral;   FOOT SURGERY Left    Screws and plates   JOINT REPLACEMENT Left 12/2017   DR Rudene Christians Hip   KNEE SURGERY Left    X 2   LEG SURGERY     LUNG CANCER  SURGERY     POLYPECTOMY N/A 02/19/2016   Procedure: POLYPECTOMY;  Surgeon: Lucilla Lame, MD;  Location: [place];  Service: Endoscopy;  Laterality: N/A;   POLYPECTOMY  01/19/2018   Procedure: POLYPECTOMY;  Surgeon: Lucilla Lame, MD;  Location: [facility];  Service: Endoscopy;;   PORTACATH PLACEMENT N/A 11/11/2019   Procedure: INSERTION PORT-A-CATH;  Surgeon: Nestor Lewandowsky, MD;  Location: [facility];  Service: General;  Laterality: N/A;   THORACOTOMY Left 01/14/2019   Procedure: THORACOTOMY MAJOR, LEFT;  Surgeon: Nestor Lewandowsky, MD;  Location: [facility];  Service: General;  Laterality: Left;   TOTAL HIP ARTHROPLASTY Left 12/02/2017   Procedure: TOTAL HIP ARTHROPLASTY ANTERIOR APPROACH;  Surgeon: Hessie Knows, MD;  Location: [facility];  Service: Orthopedics;  Laterality: Left;   VIDEO BRONCHOSCOPY Left 01/14/2019   Procedure: VIDEO BRONCHOSCOPY WITH FLUORO, LEFT;  Surgeon: Nestor Lewandowsky, MD;  Location: [facility];  Service: General;  Laterality: Left;   VIDEO BRONCHOSCOPY WITH ENDOBRONCHIAL NAVIGATION N/A 10/15/2019   Procedure: VIDEO BRONCHOSCOPY WITH ENDOBRONCHIAL NAVIGATION;  Surgeon: Ottie Glazier, MD;  Location: [facility];  Service: Thoracic;  Laterality: N/A;   VIDEO BRONCHOSCOPY WITH ENDOBRONCHIAL ULTRASOUND N/A 10/15/2019   Procedure: VIDEO BRONCHOSCOPY WITH ENDOBRONCHIAL ULTRASOUND;  Surgeon: Ottie Glazier, MD;  Location: [facility];  Service: Thoracic;  Laterality: N/A;    FAMILY HISTORY: Family History  Problem Relation Age of Onset   Cancer Father    Diabetes Sister    Thrombosis Sister     ADVANCED DIRECTIVES (Y/N):  N  HEALTH MAINTENANCE: Social History   Tobacco Use   Smoking status: Former    Packs/day: 0.25    Years: 35.00    Total pack years: 8.75    Types: Cigarettes    Quit date: 01/14/2019    Years since quitting: 3.3   Smokeless tobacco: Never  Vaping Use   Vaping Use: Never used  Substance Use Topics   Alcohol use: No    Alcohol/week: 0.0  standard drinks of alcohol   Drug use: Never     Colonoscopy:  PAP:  Bone density:  Lipid panel:  Allergies  Allergen Reactions   Acetaminophen Swelling   Aspirin Anaphylaxis   Epinephrine Anaphylaxis    Does not include albuterol   Novocain [Procaine] Anaphylaxis   Penicillins Anaphylaxis    Has patient had a PCN reaction causing immediate rash, facial/tongue/throat swelling, SOB or lightheadedness with hypotension: Yes Has patient had a PCN reaction causing severe rash involving mucus membranes or skin necrosis: No Has patient had a PCN reaction that required hospitalization: Yes Has patient had a PCN reaction occurring within the last 10 years: No If all of the above answers are "NO", then may proceed with Cephalosporin use.    Strawberry Extract Anaphylaxis   Shellfish Allergy Hives and Nausea And Vomiting    Current Outpatient Medications  Medication Sig Dispense Refill   Accu-Chek FastClix Lancets MISC USE TO TEST BLOOD SUGAR 2X A DAY 100 each 12   albuterol (PROVENTIL) (2.5 MG/3ML) 0.083% nebulizer solution Take 3 mLs (2.5 mg total) by nebulization every 6 (six) hours as needed for wheezing or shortness of breath. 150 mL 1  amLODipine (NORVASC) 5 MG tablet Take 1 tablet (5 mg total) by mouth daily. 180 tablet 1   atorvastatin (LIPITOR) 10 MG tablet Take 1 tablet (10 mg total) by mouth daily at 6 PM. 90 tablet 1   BREZTRI AEROSPHERE 160-9-4.8 MCG/ACT AERO Inhale 2 puffs into the lungs 2 (two) times daily. Maintenance per pulmonology 5.9 g 6   diphenhydrAMINE (BENADRYL) 25 MG tablet Take 75 mg by mouth in the morning and at bedtime.     empagliflozin (JARDIANCE) 25 MG TABS tablet TAKE 1 TABLET(25 MG) BY MOUTH DAILY BEFORE BREAKFAST 90 tablet 1   esomeprazole (NEXIUM) 40 MG capsule TAKE 1 CAPSULE(40 MG) BY MOUTH DAILY 90 capsule 3   glucose blood (ACCU-CHEK GUIDE) test strip USE TO TEST BLOOD SUGAR 2X A DAY 100 strip 3   hydrALAZINE (APRESOLINE) 100 MG tablet Take 1 tablet  (100 mg total) by mouth 2 (two) times daily. 180 tablet 1   lidocaine (LIDODERM) 5 % UNWRAP AND APPLY 1 PATCH ONTO THE SKIN DAILY. REMOVE AND DISCARD PATCH WITHIN 12 HOURS OR AS DIRECTED BY MD 28 patch 12   metFORMIN (GLUCOPHAGE) 500 MG tablet TAKE 2 TABLETS(1000 MG) BY MOUTH TWICE DAILY WITH A MEAL 360 tablet 1   metoprolol succinate (TOPROL-XL) 100 MG 24 hr tablet TAKE 1 TABLET BY MOUTH TWICE DAILY WITH OR IMMEDIATELY FOLLOWING A MEAL; TO BE TAKEN WITH 50 MG METOPROLOL TARTRATE 180 tablet 1   metoprolol tartrate (LOPRESSOR) 50 MG tablet Take 1 tablet (50 mg total) by mouth 2 (two) times daily. TO BE TAKEN WITH 100 MG METOPROLOL TARTRATE 180 tablet 0   montelukast (SINGULAIR) 10 MG tablet TAKE 1 TABLET(10 MG) BY MOUTH DAILY 90 tablet 1   [START ON 05/15/2022] morphine (MSIR) 15 MG tablet Take 1 tablet (15 mg total) by mouth every 6 (six) hours as needed for moderate pain or severe pain. Must last 30 days. 120 tablet 0   [START ON 06/14/2022] morphine (MSIR) 15 MG tablet Take 1 tablet (15 mg total) by mouth every 6 (six) hours as needed for moderate pain or severe pain. Must last 30 days. 120 tablet 0   [START ON 07/14/2022] morphine (MSIR) 15 MG tablet Take 1 tablet (15 mg total) by mouth every 6 (six) hours as needed for moderate pain or severe pain. Must last 30 days. 120 tablet 0   Multiple Vitamin (MULTIVITAMIN WITH MINERALS) TABS tablet Take 1 tablet by mouth daily.     naloxone (NARCAN) nasal spray 4 mg/0.1 mL Place 1 spray into the nose as needed for up to 365 doses (for opioid-induced respiratory depresssion). In case of emergency (overdose), spray once into each nostril. If no response within 3 minutes, repeat application and call [id number]. 1 each 0   nortriptyline (PAMELOR) 25 MG capsule TAKE 2 CAPSULES(50 MG) BY MOUTH AT BEDTIME 180 capsule 1   Potassium Chloride ER 20 MEQ TBCR Take 1 tablet by mouth daily. 90 tablet 1   Semaglutide (RYBELSUS) 14 MG TABS Take 1 tablet (14 mg total) by mouth daily.  90 tablet 1   STIOLTO RESPIMAT 2.5-2.5 MCG/ACT AERS Inhale 2 puffs into the lungs 4 (four) times daily as needed. Prn per pulmonology notes 4 g 12   SUMAtriptan (IMITREX) 50 MG tablet Take 1 tablet (50 mg total) by mouth every 2 (two) hours as needed for migraine. Take 1 tab at onset of migraine. May repeat in 2 hours if headache persists or recurs. 10 tablet 12   albuterol (VENTOLIN  HFA) 108 (90 Base) MCG/ACT inhaler Inhale into the lungs.     No current facility-administered medications for this visit.   Facility-Administered Medications Ordered in Other Visits  Medication Dose Route Frequency Provider Last Rate Last Admin   0.9 %  sodium chloride infusion    Continuous PRN Dionne Bucy, CRNA   New Bag at 01/22/19 0705   sodium chloride flush (NS) 0.9 % injection 10 mL  10 mL Intracatheter PRN [name], MD   10 mL at 03/01/20 0953    OBJECTIVE: Vitals:   05/14/22 1057  BP: 128/87  Pulse: 65  Resp: 16  Temp: (!) 97 F (36.1 C)  SpO2: 100%      Body mass index is 32.43 kg/m.    ECOG FS:0 - Asymptomatic  General: Well-developed, well-nourished, no acute distress. Eyes: Pink conjunctiva, anicteric sclera. HEENT: Normocephalic, moist mucous membranes. Lungs: No audible wheezing or coughing. Heart: Regular rate and rhythm. Abdomen: Soft, nontender, no obvious distention. Musculoskeletal: No edema, cyanosis, or clubbing. Neuro: Alert, answering all questions appropriately. Cranial nerves grossly intact. Skin: No rashes or petechiae noted. Psych: Normal affect.  LAB RESULTS:  Lab Results  Component Value Date   NA 142 03/12/2022   K 3.8 03/12/2022   CL 102 03/12/2022   CO2 23 03/12/2022   GLUCOSE 100 (H) 03/12/2022   BUN 11 03/12/2022   CREATININE 0.70 05/08/2022   CALCIUM 9.4 03/12/2022   PROT 7.5 03/12/2022   ALBUMIN 4.6 03/12/2022   AST 48 (H) 03/12/2022   ALT 53 (H) 03/12/2022   ALKPHOS 175 (H) 03/12/2022   BILITOT 0.4 03/12/2022   GFRNONAA >60  07/05/2021   GFRAA >60 12/01/2019    Lab Results  Component Value Date   WBC 5.9 03/12/2022   NEUTROABS 3.6 03/12/2022   HGB 14.6 03/12/2022   HCT 45.3 03/12/2022   MCV 91 03/12/2022   PLT 158 03/12/2022     STUDIES:   ONCOLOGY HISTORY: Patient underwent lung resection on November 12, [id number] and had a complicated postoperative course. CT scan results from August 14, 2019 reviewed independently with a suspicious subcarinal lymph node measuring 1.4 cm.  Follow-up PET scan on August 23, 2019 revealed hypermetabolism in lymph node highly suspicious for recurrence.  Biopsy confirmed recurrence increasing patient's stage from IA up to IIIA. Patient completed concurrent XRT and weekly carboplatinum and Taxol on January 05, 2020.  Patient completed 1 year of maintenance durvalumab on January 17, 2021.  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  No intervention is needed at this time.  Continue routine screening every 6 months until patient is at least 2 years removed from completing his maintenance treatment in November 2024 at which time patient can be transition to yearly imaging and visits.  Return to clinic in 6 months with repeat CT scan and further evaluation.   Pain: Patient does not complain of this today.  Unrelated to the of malignancy.  Continue monitoring and treatment per pain clinic.   Patient expressed understanding and was in agreement with this plan. He also understands that He can call clinic at any time with any questions, concerns, or complaints.    Cancer Staging  Squamous cell carcinoma of left lung ([place]) Staging form: Lung, AJCC 8th  Edition - Clinical stage from 02/12/2019: Stage IIIA (cT1b, cN2, cM0) - Signed by [name], MD on 11/01/2019   [name], MD   05/14/2022 11:55 AM

## 2022-05-17 LAB — TOXASSURE SELECT 13 (MW), URINE

## 2022-05-20 ENCOUNTER — Ambulatory Visit: Payer: 59

## 2022-05-20 NOTE — Patient Outreach (Signed)
  Care Management   Follow Up Note   05/20/2022 Name: Kristopher [name] MRN: [id number] DOB: [birth year]   Referred by: [name], DO Reason for referral : No chief complaint on file.   Successful contact was made with patient to discuss care management and care coordination services. Patient wishes to consider and/or discuss with PCP prior to consenting to or declining services.  -Patient was out for a walk and forgot about appt. Will reschedule to next month    Follow Up Plan: The patient has been provided with contact information for the care management team and has been advised to call with any health related questions or concerns.   [place] Constable, Pharm.D. - [phone]

## 2022-05-21 ENCOUNTER — Telehealth: Payer: Self-pay

## 2022-05-21 NOTE — Progress Notes (Signed)
Called and spoke with patient to reschedule his appointment on 05/20/22 to 07/22/22. Patient stated that he was not home but his blood sugar readings have been as high as 130s and the lows  in the 120s. Asked patient to keep a log of his readings and myself or Myriam will call him back on 05/28/22 to get bs readings.  Ethelene Hal

## 2022-05-23 ENCOUNTER — Other Ambulatory Visit: Payer: Self-pay | Admitting: Family Medicine

## 2022-05-24 NOTE — Telephone Encounter (Signed)
Unable to refill per protocol, Rx request is too soon. Last refill 03/12/22 for 90 days and 1 refill.  Requested Prescriptions  Pending Prescriptions Disp Refills   JARDIANCE 25 MG TABS tablet [Pharmacy Med Name: JARDIANCE 25MG  TABLETS] 90 tablet 1    Sig: TAKE 1 TABLET(25 MG) BY MOUTH DAILY BEFORE BREAKFAST     Endocrinology:  Diabetes - SGLT2 Inhibitors Failed - 05/23/2022  5:13 PM      Failed - HBA1C is between 0 and 7.9 and within 180 days    HB A1C (BAYER DCA - WAIVED)  Date Value Ref Range Status  03/12/2022 8.2 (H) 4.8 - 5.6 % Final    Comment:             Prediabetes: 5.7 - 6.4          Diabetes: >6.4          Glycemic control for adults with diabetes: <7.0          Passed - Cr in normal range and within 360 days    Creatinine, Ser  Date Value Ref Range Status  05/08/2022 0.70 0.61 - 1.24 mg/dL Final         Passed - eGFR in normal range and within 360 days    GFR calc Af Amer  Date Value Ref Range Status  12/01/2019 >60 >60 mL/min Final   GFR, Estimated  Date Value Ref Range Status  07/05/2021 >60 >60 mL/min Final    Comment:    (NOTE) Calculated using the CKD-EPI Creatinine Equation (2021)    eGFR  Date Value Ref Range Status  03/12/2022 102 >59 mL/min/1.73 Final         Passed - Valid encounter within last 6 months    Recent Outpatient Visits           2 months ago Type 2 diabetes mellitus with stage 1 chronic kidney disease, without long-term current use of insulin ([place])   [place], [name], DO   3 months ago Chronic pain syndrome   [place] [facility] [place], [name], DO   5 months ago Type 2 diabetes mellitus with stage 1 chronic kidney disease, without long-term current use of insulin ([place])   [place], [name], DO   8 months ago Routine general medical examination at a health care facility   [place], [place] P, DO   1 year ago  COPD exacerbation [facility])   [place], Barb Merino, DO       Future Appointments             In 2 weeks Wynetta Emery, Barb Merino, DO [name]

## 2022-06-10 ENCOUNTER — Telehealth: Payer: Self-pay

## 2022-06-10 NOTE — Progress Notes (Incomplete)
Note marked as an error

## 2022-06-11 ENCOUNTER — Encounter: Payer: Self-pay | Admitting: Family Medicine

## 2022-06-11 ENCOUNTER — Ambulatory Visit (INDEPENDENT_AMBULATORY_CARE_PROVIDER_SITE_OTHER): Payer: 59 | Admitting: Family Medicine

## 2022-06-11 VITALS — BP 115/72 | HR 67 | Temp 97.8°F | Ht 70.0 in | Wt 223.7 lb

## 2022-06-11 DIAGNOSIS — E1122 Type 2 diabetes mellitus with diabetic chronic kidney disease: Secondary | ICD-10-CM | POA: Diagnosis not present

## 2022-06-11 DIAGNOSIS — N181 Chronic kidney disease, stage 1: Secondary | ICD-10-CM | POA: Diagnosis not present

## 2022-06-11 LAB — BAYER DCA HB A1C WAIVED: HB A1C (BAYER DCA - WAIVED): 7 % — ABNORMAL HIGH (ref 4.8–5.6)

## 2022-06-11 NOTE — Assessment & Plan Note (Signed)
Doing great with A1c of 7.0 down from 8.2. Continue current regimen. Continue to monitor. Call with any concerns.

## 2022-06-11 NOTE — Progress Notes (Signed)
BP 115/72   Pulse 67   Temp 97.8 F (36.6 C) (Oral)   Ht 5\' 10"  (1.778 m)   Wt 223 lb 11.2 oz (101.5 kg)   SpO2 97%   BMI 32.10 kg/m    Subjective:    Patient ID: Kristopher [name], male    DOB: 1960-08-09, 62 y.o.   MRN: [id number]  HPI: Kristopher [name] is a 62 y.o. male  Chief Complaint  Patient presents with   Diabetes   DIABETES Hypoglycemic episodes:no Polydipsia/polyuria: no Visual disturbance: no Chest pain: no Paresthesias: no Glucose Monitoring: yes Taking Insulin?: no Blood Pressure Monitoring: a few times a month Retinal Examination: Up to Date Foot Exam: Up to Date Diabetic Education: Completed Pneumovax: Up to Date Influenza: Up to Date Aspirin: no  Relevant past medical, surgical, family and social history reviewed and updated as indicated. Interim medical history since our last visit reviewed. Allergies and medications reviewed and updated.  Review of Systems  Constitutional: Negative.   Respiratory: Negative.    Cardiovascular: Negative.   Musculoskeletal: Negative.   Neurological: Negative.   Psychiatric/Behavioral: Negative.      Per HPI unless specifically indicated above     Objective:    BP 115/72   Pulse 67   Temp 97.8 F (36.6 C) (Oral)   Ht 5\' 10"  (1.778 m)   Wt 223 lb 11.2 oz (101.5 kg)   SpO2 97%   BMI 32.10 kg/m   Wt Readings from Last 3 Encounters:  06/11/22 223 lb 11.2 oz (101.5 kg)  05/14/22 226 lb (102.5 kg)  05/08/22 220 lb (99.8 kg)    Physical Exam Vitals and nursing note reviewed.  Constitutional:      General: He is not in acute distress.    Appearance: Normal appearance. He is not ill-appearing, toxic-appearing or diaphoretic.  HENT:     Head: Normocephalic and atraumatic.     Right Ear: External ear normal.     Left Ear: External ear normal.     Nose: Nose normal.     Mouth/Throat:     Mouth: Mucous membranes are moist.     Pharynx: Oropharynx is clear.  Eyes:     General: No scleral icterus.        Right eye: No discharge.        Left eye: No discharge.     Extraocular Movements: Extraocular movements intact.     Conjunctiva/sclera: Conjunctivae normal.     Pupils: Pupils are equal, round, and reactive to light.  Cardiovascular:     Rate and Rhythm: Normal rate and regular rhythm.     Pulses: Normal pulses.     Heart sounds: Normal heart sounds. No murmur heard.    No friction rub. No gallop.  Pulmonary:     Effort: Pulmonary effort is normal. No respiratory distress.     Breath sounds: Normal breath sounds. No stridor. No wheezing, rhonchi or rales.  Chest:     Chest wall: No tenderness.  Musculoskeletal:        General: Normal range of motion.     Cervical back: Normal range of motion and neck supple.  Skin:    General: Skin is warm and dry.     Capillary Refill: Capillary refill takes less than 2 seconds.     Coloration: Skin is not jaundiced or pale.     Findings: No bruising, erythema, lesion or rash.  Neurological:     General: No focal deficit present.  Mental Status: He is alert and oriented to person, place, and time. Mental status is at baseline.  Psychiatric:        Mood and Affect: Mood normal.        Behavior: Behavior normal.        Thought Content: Thought content normal.        Judgment: Judgment normal.     Results for orders placed or performed during the hospital encounter of 05/08/22  I-STAT creatinine  Result Value Ref Range   Creatinine, Ser 0.70 0.61 - 1.24 mg/dL      Assessment & Plan:   Problem List Items Addressed This Visit       Endocrine   Type 2 diabetes mellitus with stage 1 chronic kidney disease, without long-term current use of insulin - Primary    Doing great with A1c of 7.0 down from 8.2. Continue current regimen. Continue to monitor. Call with any concerns.       Relevant Orders   Bayer DCA Hb A1c Waived     Follow up plan: Return in about 3 months (around 09/10/2022) for physical.

## 2022-06-14 ENCOUNTER — Other Ambulatory Visit: Payer: Self-pay | Admitting: Family Medicine

## 2022-06-14 NOTE — Telephone Encounter (Signed)
Unable to refill per protocol, Rx request is too soon. Last refill 03/12/22 for 90 and 1 refill.  Requested Prescriptions  Pending Prescriptions Disp Refills   Potassium Chloride ER 20 MEQ TBCR [Pharmacy Med Name: POTASSIUM CHLORIDE ER TABLETS] 90 tablet 1    Sig: TAKE 1 TABLET BY MOUTH DAILY     Endocrinology:  Minerals - Potassium Supplementation Passed - 06/14/2022  3:39 AM      Passed - K in normal range and within 360 days    Potassium  Date Value Ref Range Status  03/12/2022 3.8 3.5 - 5.2 mmol/L Final         Passed - Cr in normal range and within 360 days    Creatinine, Ser  Date Value Ref Range Status  05/08/2022 0.70 0.61 - 1.24 mg/dL Final         Passed - Valid encounter within last 12 months    Recent Outpatient Visits           3 days ago Type 2 diabetes mellitus with stage 1 chronic kidney disease, without long-term current use of insulin   [place] [facility] [place], [name], DO   3 months ago Type 2 diabetes mellitus with stage 1 chronic kidney disease, without long-term current use of insulin (HCC)   [facility] [place], [name], DO   3 months ago Chronic pain syndrome   [place] [facility] [place], [name], DO   6 months ago Type 2 diabetes mellitus with stage 1 chronic kidney disease, without long-term current use of insulin (HCC)   [place] [facility] [place], [name], DO   9 months ago Routine general medical examination at a health care facility   [facility] Dorcas Carrow, DO       Future Appointments             In 2 months Dorcas Carrow, DO [name]

## 2022-06-28 ENCOUNTER — Other Ambulatory Visit: Payer: Self-pay | Admitting: Family Medicine

## 2022-06-28 NOTE — Telephone Encounter (Signed)
Requested Prescriptions  Pending Prescriptions Disp Refills   amLODipine (NORVASC) 5 MG tablet [Pharmacy Med Name: AMLODIPINE BESYLATE 5MG  TABLETS] 180 tablet 1    Sig: TAKE 1 TABLET(5 MG) BY MOUTH TWICE DAILY     Cardiovascular: Calcium Channel Blockers 2 Passed - 06/28/2022  3:39 AM      Passed - Last BP in normal range    BP Readings from Last 1 Encounters:  06/11/22 115/72         Passed - Last Heart Rate in normal range    Pulse Readings from Last 1 Encounters:  06/11/22 67         Passed - Valid encounter within last 6 months    Recent Outpatient Visits           2 weeks ago Type 2 diabetes mellitus with stage 1 chronic kidney disease, without long-term current use of insulin (HCC)   [name] [facility] [place], [name], DO   3 months ago Type 2 diabetes mellitus with stage 1 chronic kidney disease, without long-term current use of insulin (HCC)   [name] [facility] [name], [name], DO   4 months ago Chronic pain syndrome   [name] [facility] [name], [name], DO   6 months ago Type 2 diabetes mellitus with stage 1 chronic kidney disease, without long-term current use of insulin (HCC)   [name], [name], DO   9 months ago Routine general medical examination at a health care facility   [name], DO       Future Appointments             In 2 months Dorcas Carrow, DO [name]

## 2022-07-03 ENCOUNTER — Other Ambulatory Visit: Payer: Self-pay | Admitting: Family Medicine

## 2022-07-03 NOTE — Telephone Encounter (Signed)
Pt's wife called requesting "test strips and barrels for his glucose meter" [id number] [place], [place] - [address] AT [place] OF [address]  [address] [place] [address]  Phone: [phone] Fax: [phone]   glucose blood (ACCU-CHEK GUIDE) test strip  Accu-Chek FastClix Lancets MISC

## 2022-07-03 NOTE — Telephone Encounter (Signed)
Requested medication (s) are due for refill today - expired Rx  Requested medication (s) are on the active medication list -yes  Future visit scheduled -yes  Last refill: 2021,2022  Notes to clinic: expired Rx- sent for review   Requested Prescriptions  Pending Prescriptions Disp Refills   Accu-Chek FastClix Lancets MISC 100 each 12     Endocrinology: Diabetes - Testing Supplies Passed - 07/03/2022  9:39 AM      Passed - Valid encounter within last 12 months    Recent Outpatient Visits           3 weeks ago Type 2 diabetes mellitus with stage 1 chronic kidney disease, without long-term current use of insulin (HCC)   [place] [facility] [place], [name], DO   3 months ago Type 2 diabetes mellitus with stage 1 chronic kidney disease, without long-term current use of insulin (HCC)   [place] [facility] [place], [name], DO   4 months ago Chronic pain syndrome   [place] [facility] [place], [name], DO   6 months ago Type 2 diabetes mellitus with stage 1 chronic kidney disease, without long-term current use of insulin (HCC)   [place] [facility] [place], [name], DO   9 months ago Routine general medical examination at a health care facility   [facility] [place], [place] P, DO       Future Appointments             In 2 months Laural Benes, Oralia Rud, DO [place] [name]             glucose blood (ACCU-CHEK GUIDE) test strip 100 strip 3    Sig: USE TO TEST BLOOD SUGAR 2X A DAY     Endocrinology: Diabetes - Testing Supplies Passed - 07/03/2022  9:39 AM      Passed - Valid encounter within last 12 months    Recent Outpatient Visits           3 weeks ago Type 2 diabetes mellitus with stage 1 chronic kidney disease, without long-term current use of insulin (HCC)   [place] [facility] [place], [name], DO   3 months ago Type 2 diabetes mellitus with  stage 1 chronic kidney disease, without long-term current use of insulin (HCC)   [place] [facility] [place], [name], DO   4 months ago Chronic pain syndrome   [place] [facility] [place], [name], DO   6 months ago Type 2 diabetes mellitus with stage 1 chronic kidney disease, without long-term current use of insulin (HCC)   [place] [facility] [place], [name], DO   9 months ago Routine general medical examination at a health care facility   [facility] Dor[facility] C[facility], DO       Future Appointments             In 2 months Dorcas Carrow, DO [facility] [place], [facility]               Requested Prescriptions  Pending Prescriptions Disp Refills   Accu-Chek FastClix Lancets MISC 100 each 12     Endocrinology: Diabetes - Testing Supplies Passed - 07/03/2022  9:39 AM      Passed - Valid encounter within last 12 months    Recent Outpatient Visits  3 weeks ago Type 2 diabetes mellitus with stage 1 chronic kidney disease, without long-term current use of insulin (HCC)   [place] [facility] [place], [name], DO   3 months ago Type 2 diabetes mellitus with stage 1 chronic kidney disease, without long-term current use of insulin (HCC)   [place] [facility] [place], [name], DO   4 months ago Chronic pain syndrome   [place] [facility] [place], [name], DO   6 months ago Type 2 diabetes mellitus with stage 1 chronic kidney disease, without long-term current use of insulin (HCC)   [place] [facility] [place], [name], DO   9 months ago Routine general medical examination at a health care facility   [facility] [place], [place] P, DO       Future Appointments             In 2 months Laural Benes, Oralia Rud, DO [place] [name]             glucose blood (ACCU-CHEK  GUIDE) test strip 100 strip 3    Sig: USE TO TEST BLOOD SUGAR 2X A DAY     Endocrinology: Diabetes - Testing Supplies Passed - 07/03/2022  9:39 AM      Passed - Valid encounter within last 12 months    Recent Outpatient Visits           3 weeks ago Type 2 diabetes mellitus with stage 1 chronic kidney disease, without long-term current use of insulin (HCC)   [place] [facility] [place], [name], DO   3 months ago Type 2 diabetes mellitus with stage 1 chronic kidney disease, without long-term current use of insulin (HCC)   [place] [facility] [place], [name], DO   4 months ago Chronic pain syndrome   [place] [facility] [place], [name], DO   6 months ago Type 2 diabetes mellitus with stage 1 chronic kidney disease, without long-term current use of insulin (HCC)   [place] [facility] [place], [name], DO   9 months ago Routine general medical examination at a health care facility   [facility] Dorcas Carrow, DO       Future Appointments             In 2 months Laural Benes, Oralia Rud, DO  [facility], [name]

## 2022-07-04 MED ORDER — ACCU-CHEK GUIDE VI STRP: ORAL_STRIP | 3 refills | Status: DC

## 2022-07-04 MED ORDER — ACCU-CHEK FASTCLIX LANCETS MISC: 12 refills | Status: AC

## 2022-07-16 ENCOUNTER — Other Ambulatory Visit: Payer: Self-pay | Admitting: Family Medicine

## 2022-07-16 NOTE — Telephone Encounter (Signed)
Requested Prescriptions  Pending Prescriptions Disp Refills   albuterol (PROVENTIL) (2.5 MG/3ML) 0.083% nebulizer solution [Pharmacy Med Name: ALBUTEROL 0.083%(2.5MG /3ML) 25X3ML]      Sig: USE 1 VIAL VIA NEBULIZER EVERY 6 HOURS AS NEEDED FOR WHEEZING OR SHORTNESS OF BREATH     Pulmonology:  Beta Agonists 2 Passed - 07/16/2022  9:16 AM      Passed - Last BP in normal range    BP Readings from Last 1 Encounters:  06/11/22 115/72         Passed - Last Heart Rate in normal range    Pulse Readings from Last 1 Encounters:  06/11/22 67         Passed - Valid encounter within last 12 months    Recent Outpatient Visits           1 month ago Type 2 diabetes mellitus with stage 1 chronic kidney disease, without long-term current use of insulin (HCC)   [place] [facility] [place], [name], DO   4 months ago Type 2 diabetes mellitus with stage 1 chronic kidney disease, without long-term current use of insulin (HCC)   [place] [facility] [place], [name], DO   4 months ago Chronic pain syndrome   [place] [facility] [place], [name], DO   7 months ago Type 2 diabetes mellitus with stage 1 chronic kidney disease, without long-term current use of insulin (HCC)   [place] [facility] [place], [name], DO   10 months ago Routine general medical examination at a health care facility   [facility] Dorcas Carrow, DO       Future Appointments             In 1 month Laural Benes, Oralia Rud, DO  [name]

## 2022-07-17 ENCOUNTER — Other Ambulatory Visit: Payer: Self-pay | Admitting: Family Medicine

## 2022-07-17 NOTE — Telephone Encounter (Signed)
Requested Prescriptions  Pending Prescriptions Disp Refills   metoprolol succinate (TOPROL-XL) 100 MG 24 hr tablet [Pharmacy Med Name: METOPROLOL ER SUCCINATE 100MG  TABS] 180 tablet 0    Sig: TAKE 1 TABLET BY MOUTH TWICE DAILY WITH OR IMMEDIATELY FOLLOWING A MEAL; TO BE TAKEN WITH 50 MG METOPROLOL TARTRATE     Cardiovascular:  Beta Blockers Passed - 07/17/2022 10:19 AM      Passed - Last BP in normal range    BP Readings from Last 1 Encounters:  06/11/22 115/72         Passed - Last Heart Rate in normal range    Pulse Readings from Last 1 Encounters:  06/11/22 67         Passed - Valid encounter within last 6 months    Recent Outpatient Visits           1 month ago Type 2 diabetes mellitus with stage 1 chronic kidney disease, without long-term current use of insulin (HCC)   [place] [facility] [place], [name], DO   4 months ago Type 2 diabetes mellitus with stage 1 chronic kidney disease, without long-term current use of insulin (HCC)   [place] [facility] [place], [name], DO   4 months ago Chronic pain syndrome   [place] [facility] [place], [name], DO   7 months ago Type 2 diabetes mellitus with stage 1 chronic kidney disease, without long-term current use of insulin (HCC)   [place] [facility] [place], [name], DO   10 months ago Routine general medical examination at a health care facility   [facility] Dorcas Carrow, DO       Future Appointments             In 1 month Laural Benes, Oralia Rud, DO  [name]

## 2022-07-22 ENCOUNTER — Ambulatory Visit: Payer: 59

## 2022-07-22 NOTE — Patient Outreach (Signed)
  Care Management   Follow Up Note   07/22/2022 Name: Kristopher [name] MRN: [id number] DOB: [birth year]   Referred by: Dorcas Carrow, DO Reason for referral : No chief complaint on file.   An unsuccessful telephone outreach was attempted today. The patient was referred to the case management team for assistance with care management and care coordination.   Follow Up Plan: Spoke with patient, he forgot about appt. Will have team reschedule  Artelia Laroche, Pharm.D. - [phone]

## 2022-07-23 ENCOUNTER — Other Ambulatory Visit: Payer: Self-pay | Admitting: Family Medicine

## 2022-07-23 NOTE — Telephone Encounter (Signed)
Rx was sent to pharmacy on 07/17/22 #180/0 RF  Requested Prescriptions  Pending Prescriptions Disp Refills   metoprolol succinate (TOPROL-XL) 100 MG 24 hr tablet [Pharmacy Med Name: METOPROLOL ER SUCCINATE 100MG  TABS] 180 tablet 0    Sig: TAKE 1 TABLET BY MOUTH TWICE DAILY WITH OR IMMEDIATELY FOLLOWING A MEAL. TO BE TAKEN WITH METOPROLOL TARTRATE 50MG      Cardiovascular:  Beta Blockers Passed - 07/23/2022  8:04 AM      Passed - Last BP in normal range    BP Readings from Last 1 Encounters:  06/11/22 115/72         Passed - Last Heart Rate in normal range    Pulse Readings from Last 1 Encounters:  06/11/22 67         Passed - Valid encounter within last 6 months    Recent Outpatient Visits           1 month ago Type 2 diabetes mellitus with stage 1 chronic kidney disease, without long-term current use of insulin (HCC)   [place] [facility] [place], [name], DO   4 months ago Type 2 diabetes mellitus with stage 1 chronic kidney disease, without long-term current use of insulin (HCC)   [place] [facility] [place], [name], DO   5 months ago Chronic pain syndrome   [place] [facility] [place], [name], DO   7 months ago Type 2 diabetes mellitus with stage 1 chronic kidney disease, without long-term current use of insulin (HCC)   [place] [facility] [place], [name], DO   10 months ago Routine general medical examination at a health care facility   [facility] Dorcas Carrow, DO       Future Appointments             In 1 month Laural Benes, Oralia Rud, DO  [name]

## 2022-08-07 ENCOUNTER — Ambulatory Visit: Payer: Self-pay

## 2022-08-07 ENCOUNTER — Encounter: Payer: Self-pay | Admitting: Pain Medicine

## 2022-08-07 ENCOUNTER — Ambulatory Visit: Payer: 59 | Attending: Pain Medicine | Admitting: Pain Medicine

## 2022-08-07 DIAGNOSIS — M5441 Lumbago with sciatica, right side: Secondary | ICD-10-CM | POA: Insufficient documentation

## 2022-08-07 DIAGNOSIS — G8929 Other chronic pain: Secondary | ICD-10-CM

## 2022-08-07 DIAGNOSIS — C3492 Malignant neoplasm of unspecified part of left bronchus or lung: Secondary | ICD-10-CM

## 2022-08-07 DIAGNOSIS — Z79899 Other long term (current) drug therapy: Secondary | ICD-10-CM | POA: Diagnosis not present

## 2022-08-07 DIAGNOSIS — Z79891 Long term (current) use of opiate analgesic: Secondary | ICD-10-CM

## 2022-08-07 DIAGNOSIS — M79601 Pain in right arm: Secondary | ICD-10-CM | POA: Diagnosis not present

## 2022-08-07 DIAGNOSIS — M5442 Lumbago with sciatica, left side: Secondary | ICD-10-CM | POA: Diagnosis not present

## 2022-08-07 DIAGNOSIS — M79602 Pain in left arm: Secondary | ICD-10-CM | POA: Diagnosis not present

## 2022-08-07 DIAGNOSIS — M79605 Pain in left leg: Secondary | ICD-10-CM | POA: Diagnosis not present

## 2022-08-07 DIAGNOSIS — G894 Chronic pain syndrome: Secondary | ICD-10-CM

## 2022-08-07 DIAGNOSIS — M961 Postlaminectomy syndrome, not elsewhere classified: Secondary | ICD-10-CM

## 2022-08-07 MED ORDER — MORPHINE SULFATE 15 MG PO TABS
15.0000 mg | ORAL_TABLET | Freq: Four times a day (QID) | ORAL | 0 refills | Status: DC | PRN
Start: 2022-10-12 — End: 2022-10-31

## 2022-08-07 MED ORDER — MORPHINE SULFATE 15 MG PO TABS
15.0000 mg | ORAL_TABLET | Freq: Four times a day (QID) | ORAL | 0 refills | Status: DC | PRN
Start: 2022-08-13 — End: 2022-10-31

## 2022-08-07 MED ORDER — MORPHINE SULFATE 15 MG PO TABS
15.0000 mg | ORAL_TABLET | Freq: Four times a day (QID) | ORAL | 0 refills | Status: DC | PRN
Start: 2022-09-12 — End: 2022-10-31

## 2022-08-07 NOTE — Patient Instructions (Signed)
____________________________________________________________________________________________  Patient Information update  To: All of our patients.  Re: Name change.  It has been made official that our current name, "[facility]"   will soon be changed to "[facility]".   The purpose of this change is to eliminate any confusion created by the concept of our practice being a "Medication Management Pain Clinic". In the past this has led to the misconception that we treat pain primarily by the use of prescription medications.  Nothing can be farther from the truth.   Understanding PAIN MANAGEMENT: To further understand what our practice does, you first have to understand that "Pain Management" is a subspecialty that requires additional training once a physician has completed their specialty training, which can be in either Anesthesia, Neurology, Psychiatry, or Physical Medicine and Rehabilitation (PMR). Each one of these contributes to the final approach taken by each physician to the management of their patient's pain. To be a "Pain Management Specialist" you must have first completed one of the specialty trainings below.  Anesthesiologists - trained in clinical pharmacology and interventional techniques such as nerve blockade and regional as well as central neuroanatomy. They are trained to block pain before, during, and after surgical interventions.  Neurologists - trained in the diagnosis and pharmacological treatment of complex neurological conditions, such as Multiple Sclerosis, Parkinson's, spinal cord injuries, and other systemic conditions that may be associated with symptoms that may include but are not limited to pain. They tend to rely primarily on the treatment of chronic pain using prescription medications.  Psychiatrist - trained in conditions affecting the psychosocial  wellbeing of patients including but not limited to depression, anxiety, schizophrenia, personality disorders, addiction, and other substance use disorders that may be associated with chronic pain. They tend to rely primarily on the treatment of chronic pain using prescription medications.   Physical Medicine and Rehabilitation (PMR) physicians, also known as physiatrists - trained to treat a wide variety of medical conditions affecting the brain, spinal cord, nerves, bones, joints, ligaments, muscles, and tendons. Their training is primarily aimed at treating patients that have suffered injuries that have caused severe physical impairment. Their training is primarily aimed at the physical therapy and rehabilitation of those patients. They may also work alongside orthopedic surgeons or neurosurgeons using their expertise in assisting surgical patients to recover after their surgeries.  INTERVENTIONAL PAIN MANAGEMENT is sub-subspecialty of Pain Management.  Our physicians are Board-certified in Anesthesia, Pain Management, and Interventional Pain Management.  This meaning that not only have they been trained and Board-certified in their specialty of Anesthesia, and subspecialty of Pain Management, but they have also received further training in the sub-subspecialty of Interventional Pain Management, in order to become Board-certified as INTERVENTIONAL PAIN MANAGEMENT SPECIALIST.    Mission: Our goal is to use our skills in  INTERVENTIONAL PAIN MANAGEMENT as alternatives to the chronic use of prescription opioid medications for the treatment of pain. To make this more clear, we have changed our name to reflect what we do and offer. We will continue to offer medication management assessment and recommendations, but we will not be taking over any patient's medication  management.  ____________________________________________________________________________________________   ____________________________________________________________________________________________  Opioid Pain Medication Update  To: All patients taking opioid pain medications. (I.e.: hydrocodone, hydromorphone, oxycodone, oxymorphone, morphine, codeine, methadone, tapentadol, tramadol, buprenorphine, fentanyl, etc.)  Re: Updated review of side effects and adverse reactions of opioid analgesics, as well as new information about  long term effects of this class of medications.  Direct risks of long-term opioid therapy are not limited to opioid addiction and overdose. Potential medical risks include serious fractures, breathing problems during sleep, hyperalgesia, immunosuppression, chronic constipation, bowel obstruction, myocardial infarction, and tooth decay secondary to xerostomia.  Unpredictable adverse effects that can occur even if you take your medication correctly: Cognitive impairment, respiratory depression, and death. Most people think that if they take their medication "correctly", and "as instructed", that they will be safe. Nothing could be farther from the truth. In reality, a significant amount of recorded deaths associated with the use of opioids has occurred in individuals that had taken the medication for a long time, and were taking their medication correctly. The following are examples of how this can happen: Patient taking his/her medication for a long time, as instructed, without any side effects, is given a certain antibiotic or another unrelated medication, which in turn triggers a "Drug-to-drug interaction" leading to disorientation, cognitive impairment, impaired reflexes, respiratory depression or an untoward event leading to serious bodily harm or injury, including death.  Patient taking his/her medication for a long time, as instructed, without any side effects,  develops an acute impairment of liver and/or kidney function. This will lead to a rapid inability of the body to breakdown and eliminate their pain medication, which will result in effects similar to an "overdose", but with the same medicine and dose that they had always taken. This again may lead to disorientation, cognitive impairment, impaired reflexes, respiratory depression or an untoward event leading to serious bodily harm or injury, including death.  A similar problem will occur with patients as they grow older and their liver and kidney function begins to decrease as part of the aging process.  Background information: Historically, the original case for using long-term opioid therapy to treat chronic noncancer pain was based on safety assumptions that subsequent experience has called into question. In 1996, the American Pain Society and the American Academy of Pain Medicine issued a consensus statement supporting long-term opioid therapy. This statement acknowledged the dangers of opioid prescribing but concluded that the risk for addiction was low; respiratory depression induced by opioids was short-lived, occurred mainly in opioid-naive patients, and was antagonized by pain; tolerance was not a common problem; and efforts to control diversion should not constrain opioid prescribing. This has now proven to be wrong. Experience regarding the risks for opioid addiction, misuse, and overdose in community practice has failed to support these assumptions.  According to the Centers for Disease Control and Prevention, fatal overdoses involving opioid analgesics have increased sharply over the past decade. Currently, more than 96,700 people die from drug overdoses every year. Opioids are a factor in 7 out of every 10 overdose deaths. Deaths from drug overdose have surpassed motor vehicle accidents as the leading cause of death for individuals between the ages of 61 and 46.  Clinical data suggest that  neuroendocrine dysfunction may be very common in both men and women, potentially causing hypogonadism, erectile dysfunction, infertility, decreased libido, osteoporosis, and depression. Recent studies linked higher opioid dose to increased opioid-related mortality. Controlled observational studies reported that long-term opioid therapy may be associated with increased risk for cardiovascular events. Subsequent meta-analysis concluded that the safety of long-term opioid therapy in elderly patients has not been proven.   Side Effects and adverse reactions: Common side effects: Drowsiness (sedation). Dizziness. Nausea and vomiting. Constipation. Physical dependence -- Dependence often manifests with withdrawal symptoms when opioids are discontinued or decreased.  Tolerance -- As you take repeated doses of opioids, you require increased medication to experience the same effect of pain relief. Respiratory depression -- This can occur in healthy people, especially with higher doses. However, people with COPD, asthma or other lung conditions may be even more susceptible to fatal respiratory impairment.  Uncommon side effects: An increased sensitivity to feeling pain and extreme response to pain (hyperalgesia). Chronic use of opioids can lead to this. Delayed gastric emptying (the process by which the contents of your stomach are moved into your small intestine). Muscle rigidity. Immune system and hormonal dysfunction. Quick, involuntary muscle jerks (myoclonus). Arrhythmia. Itchy skin (pruritus). Dry mouth (xerostomia).  Long-term side effects: Chronic constipation. Sleep-disordered breathing (SDB). Increased risk of bone fractures. Hypothalamic-pituitary-adrenal dysregulation. Increased risk of overdose.  RISKS: Fractures and Falls:  Opioids increase the risk and incidence of falls. This is of particular importance in elderly patients.  Endocrine System:  Long-term administration is  associated with endocrine abnormalities (endocrinopathies). (Also known as Opioid-induced Endocrinopathy) Influences on both the hypothalamic-pituitary-adrenal axis?and the hypothalamic-pituitary-gonadal axis have been demonstrated with consequent hypogonadism and adrenal insufficiency in both sexes. Hypogonadism and decreased levels of dehydroepiandrosterone sulfate have been reported in men and women. Endocrine effects include: Amenorrhoea in women (abnormal absence of menstruation) Reduced libido in both sexes Decreased sexual function Erectile dysfunction in men Hypogonadisms (decreased testicular function with shrinkage of testicles) Infertility Depression and fatigue Loss of muscle mass Anxiety Depression Immune suppression Hyperalgesia Weight gain Anemia Osteoporosis Patients (particularly women of childbearing age) should avoid opioids. There is insufficient evidence to recommend routine monitoring of asymptomatic patients taking opioids in the long-term for hormonal deficiencies.  Immune System: Human studies have demonstrated that opioids have an immunomodulating effect. These effects are mediated via opioid receptors both on immune effector cells and in the central nervous system. Opioids have been demonstrated to have adverse effects on antimicrobial response and anti-tumour surveillance. Buprenorphine has been demonstrated to have no impact on immune function.  Opioid Induced Hyperalgesia: Human studies have demonstrated that prolonged use of opioids can lead to a state of abnormal pain sensitivity, sometimes called opioid induced hyperalgesia (OIH). Opioid induced hyperalgesia is not usually seen in the absence of tolerance to opioid analgesia. Clinically, hyperalgesia may be diagnosed if the patient on long-term opioid therapy presents with increased pain. This might be qualitatively and anatomically distinct from pain related to disease progression or to breakthrough  pain resulting from development of opioid tolerance. Pain associated with hyperalgesia tends to be more diffuse than the pre-existing pain and less defined in quality. Management of opioid induced hyperalgesia requires opioid dose reduction.  Cancer: Chronic opioid therapy has been associated with an increased risk of cancer among noncancer patients with chronic pain. This association was more evident in chronic strong opioid users. Chronic opioid consumption causes significant pathological changes in the small intestine and colon. Epidemiological studies have found that there is a link between opium dependence and initiation of gastrointestinal cancers. Cancer is the second leading cause of death after cardiovascular disease. Chronic use of opioids can cause multiple conditions such as GERD, immunosuppression and renal damage as well as carcinogenic effects, which are associated with the incidence of cancers.   Mortality: Long-term opioid use has been associated with increased mortality among patients with chronic non-cancer pain (CNCP).  Prescription of long-acting opioids for chronic noncancer pain was associated with a significantly increased risk of all-cause mortality, including deaths from causes other than overdose.  Reference: Von Peterson Ao,  Deyo RA, Chou R. Long-term opioid therapy reconsidered. Ann Intern Med. 2011 Sep 6;155(5):325-8. doi: 10.7326/[id number]. PMID: 16109604; PMCID: [id number]. Randon Goldsmith, Hayward RA, Dunn KM, [place] KP. Risk of adverse events in patients prescribed long-term opioids: A cohort study in the [place] Clinical Practice Research D[employer]. Eur J Pain. 2019 May;23(5):908-922. doi: 10.1002/ejp.1357. Epub 2019 Jan 31. PMID: 78295621. Colameco S, Coren JS, Ci[place]o CA. Continuous opioid treatment for chronic noncancer pain: a time for moderation in prescribing. Postgrad Med. 2009 Jul;121(4):61-6. doi: 10.3810/pgm.2009.07.2032.  PMID: 30865784. William Hamburger RN, [place] SD, Blazina I, Cristopher Peru, Bougatsos C, Deyo RA. The effectiveness and risks of long-term opioid therapy for chronic pain: a systematic review for a [employer]ways to [employer]. Ann Intern Med. 2015 Feb 17;162(4):276-86. doi: 10.7326/M14-2559. PMID: 69629528. Caryl Bis [facility], [place] DM. NCHS Data Brief No. 22. [place]: Centers for Disease Control and Prevention; 2009. Sep, Increase in Fatal Poisonings Involving Opioid Analgesics in the [place], 1999-2006. Song IA, Choi HR, Oh TK. Long-term opioid use and mortality in patients with chronic non-cancer pain: Ten-year follow-up study in [place] from 2010 through 2019. EClinicalMedicine. 2022 Jul 18;51:101558. doi: 10.1016/j.eclinm.2022.413244. PMID: 01027253; PMCID: [id number]. Huser, W., Schubert, T., Vogelmann, T. et al. All-cause mortality in patients with long-term opioid therapy compared with non-opioid analgesics for chronic non-cancer pain: a database study. BMC Med 18, 162 (2020). http://lester.info/ Rashidian H, Karie Kirks, Malekzadeh R, Haghdoost AA. An Ecological Study of the Association between Opiate Use and Incidence of Cancers. Addict Health. 2016 Fall;8(4):252-260. PMID: 59563875; PMCID: [id number].  Our Goal: Our goal is to control your pain with means other than the use of opioid pain medications.  Our Recommendation: Talk to your physician about coming off of these medications. We can assist you with the tapering down and stopping these medicines. Based on the new information, even if you cannot completely stop the medication, a decrease in the dose may be associated with a lesser risk. Ask for other means of controlling the pain. Decrease or eliminate those factors that significantly contribute to your pain such as smoking, obesity, and a diet heavily tilted towards "inflammatory"  nutrients.  Last Updated: 05/01/2022   ____________________________________________________________________________________________

## 2022-08-07 NOTE — Progress Notes (Signed)
Nursing Pain Medication Assessment:  Safety precautions to be maintained throughout the outpatient stay will include: orient to surroundings, keep bed in low position, maintain call bell within reach at all times, provide assistance with transfer out of bed and ambulation.  Medication Inspection Compliance: Pill count conducted under aseptic conditions, in front of the patient. Neither the pills nor the bottle was removed from the patient's sight at any time. Once count was completed pills were immediately returned to the patient in their original bottle.  Medication: Morphine IR Pill/Patch Count:  44 of 120 pills remain Pill/Patch Appearance: Markings consistent with prescribed medication Bottle Appearance: Standard pharmacy container. Clearly labeled. Filled Date: 05 / 17 / 2024 Last Medication intake:  Today

## 2022-08-07 NOTE — Telephone Encounter (Signed)
Routing to provider to advise.  

## 2022-08-07 NOTE — Telephone Encounter (Signed)
Message from Leonia Reeves sent at 08/07/2022 10:50 AM EDT  Summary: rx concern   The patient has been told by their pain management facility that their urine screen is indicating that they are taking Diazepam  The patient shares that they are not currently taking the medication and would like to further discuss their prescriptions to ensure that they are not  Please contact the patient further when possible to discuss         Chief Complaint: tested positive for midazolam and wanted to know if any of the medications could cause a positive result  Disposition: [] ED /[] Urgent Care (no appt availability in office) / [] Appointment(In office/virtual)/ []  [facility]/ [] Home Care/ [] Refused Recommended Disposition /[] [facility]/ []  Follow-up with PCP Additional Notes: pt denies any procedure or illicit or medications  that are not on his med list. Micah Flesher over med list. Pain med doctor wanted pt to call PCP to verify.   Reason for Disposition . [1] Caller has NON-URGENT medicine question about med that PCP prescribed AND [2] triager unable to answer question  Answer Assessment - Initial Assessment Questions 1. NAME of MEDICINE: "What medicine(s) are you calling about?"     unsure 2. QUESTION: "What is your question?" (e.g., double dose of medicine, side effect)     Pt wanting to know if any of his medications would cause positive UDS for diazepam or midazolam  3. PRESCRIBER: "Who prescribed the medicine?" Reason: if prescribed by specialist, call should be referred to that group.     N/a 4. SYMPTOMS: "Do you have any symptoms?" If Yes, ask: "What symptoms are you having?"  "How bad are the symptoms (e.g., mild, moderate, severe)     none 5. PREGNANCY:  "Is there any chance that you are pregnant?" "When was your last menstrual period?"     N/a  Protocols used: Medication Question Call-A-AH

## 2022-08-07 NOTE — Telephone Encounter (Signed)
He tested positive for midazolam, which is versed- which is used during procedures, not diazepam. Has he had any surgeries or procedures recently?

## 2022-08-07 NOTE — Progress Notes (Signed)
PROVIDER NOTE: Information contained herein reflects review and annotations entered in association with encounter. Interpretation of such information and data should be left to medically-trained personnel. Information provided to patient can be located elsewhere in the medical record under "Patient Instructions". Document created using STT-dictation technology, any transcriptional errors that may result from process are unintentional.    Patient: Kristopher [name]  Service Category: E/M  Provider: Oswaldo Done, MD  DOB: [birth year]  DOS: 08/07/2022  Referring Provider: Dorcas Carrow, DO  MRN: [id number]  Specialty: Interventional Pain Management  PCP: Dorcas Carrow, DO  Type: Established Patient  Setting: Ambulatory outpatient    Location: Office  Delivery: Face-to-face     HPI  Kristopher [name], a 62 y.o. year old male, is here today because of his No primary diagnosis found.. Kristopher [name] primary complain today is Back Pain (lower)  Pertinent problems: Kristopher [name] has Chronic low back pain (1ry area of Pain) (Bilateral) (L>R); Lumbar spondylosis; Chronic lumbar radicular pain (S1 dermatomal) (Left); 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); Chronic neck pain (posterior midline) (Bilateral) (L>R); Cervical spondylosis; Chronic cervical radicular pain (Bilateral) (C5/C6 dermatome) (L>R); Cervical spinal stenosis (C4-5); Cervical foraminal stenosis (Bilateral C5-6); Retrolisthesis of L5-S1; Cervical disc herniation (C4-5 and C5-6); Lumbar disc herniation (L5-S1); Chronic sacroiliac joint pain (Bilateral) (L>R); Chronic hip pain (Left); Lumbar facet syndrome (Bilateral) (L>R); Chronic lower extremity pain (2ry area of Pain) (Left); Greater occipital neuralgia (Right); Chronic pain syndrome; Migraine; Musculoskeletal pain, chronic; Left-sided weakness; Hip arthritis; S/P hip replacement; Squamous cell carcinoma of left lung (HCC); and Chronic upper extremity pain (3ry area  of Pain) (Bilateral) (L>R) on their pertinent problem list. Pain Assessment: Severity of Chronic pain is reported as a 4 /10. Location: Back Lower/left leg to the calf. Onset: More than a month ago. Quality: Stabbing. Timing: Intermittent. Modifying factor(s): medications. Vitals:  height is 5\' 10"  (1.778 m) and weight is 210 lb (95.3 kg). His temporal temperature is 97.9 F (36.6 C). His blood pressure is 131/83 and his pulse is 68. His respiration is 16 and oxygen saturation is 99%.  BMI: Estimated body mass index is 30.13 kg/m as calculated from the following:   Height as of this encounter: 5\' 10"  (1.778 m).   Weight as of this encounter: 210 lb (95.3 kg). Last encounter: 05/08/2022. Last procedure: Visit date not found.  Reason for encounter: medication management.  The patient indicates doing well with the current medication regimen. No adverse reactions or side effects reported to the medications.  Today questioned the patient about the Alpha-hydroxymidazolam that we found on his UDS.  He could not remember how it got there or what kind of medication it was.  He denies knowingly taking any type of benzodiazepine or medications for anxiety.  The half-life of this medication ranges from 2.1 to 6.2 hours and it is a metabolite of midazolam which is usually administered IV.  It is possible that the patient had a recent procedure around the time of his UDS reflecting the use of this medication.  RTCB: 11/11/2022   Pharmacotherapy Assessment  Analgesic: Morphine IR 15 mg, 1 tablet p.o. every 6 hours (60 mg/day of morphine) (60 MME) + Compounded Specialty Analgesic Cream (w/ Ketamine) MME/day: 60 mg/day.   Monitoring: [place] PMP: PDMP reviewed during this encounter.       Pharmacotherapy: No side-effects or adverse reactions reported. Compliance: No problems identified. Effectiveness: Clinically acceptable.  Concepcion Elk, RN  08/07/2022  8:20 AM  Sign when Signing Visit Nursing Pain Medication  Assessment:  Safety precautions to be maintained throughout the outpatient stay will include: orient to surroundings, keep bed in low position, maintain call bell within reach at all times, provide assistance with transfer out of bed and ambulation.  Medication Inspection Compliance: Pill count conducted under aseptic conditions, in front of the patient. Neither the pills nor the bottle was removed from the patient's sight at any time. Once count was completed pills were immediately returned to the patient in their original bottle.  Medication: Morphine IR Pill/Patch Count:  44 of 120 pills remain Pill/Patch Appearance: Markings consistent with prescribed medication Bottle Appearance: Standard pharmacy container. Clearly labeled. Filled Date: 05 / 17 / 2024 Last Medication intake:  Today    No results found for: "CBDTHCR" No results found for: "D8THCCBX" No results found for: "D9THCCBX"  UDS:  Summary  Date Value Ref Range Status  05/08/2022 Note  Final    Comment:    ==================================================================== [employer] Select 13 (MW) ==================================================================== Test                             Result       Flag       Units  Drug Present not Declared for Prescription Verification   Alpha-hydroxymidazolam         347          UNEXPECTED ng/mg creat    Alpha-hydroxymidazolam is an expected metabolite of midazolam.    Source of midazolam is a scheduled prescription medication.    Morphine                       8921         UNEXPECTED ng/mg creat    A moderate to large amount of morphine is present; the metabolites    normorphine and hydromorphone are not present. This is an atypical    result. Although patients with unusual metabolic profiles exist,    they are rare. Review of previous drug screen results or collection    of a urine sample several hours after a WITNESSED dose of the drug    may help to clarify the  subject's ability to produce metabolite.     Potential sources of large amounts of morphine in the absence of    codeine include administration of morphine or use of heroin.  ==================================================================== Test                      Result    Flag   Units      Ref Range   Creatinine              73               mg/dL      >=16 ==================================================================== Declared Medications:  The flagging and interpretation on this report are based on the  following declared medications.  Unexpected results may arise from  inaccuracies in the declared medications. ==================================================================== For clinical consultation, please call [phone]. ====================================================================       ROS  Constitutional: Denies any fever or chills Gastrointestinal: No reported hemesis, hematochezia, vomiting, or acute GI distress Musculoskeletal: Denies any acute onset joint swelling, redness, loss of ROM, or weakness Neurological: No reported episodes of acute onset apraxia, aphasia, dysarthria, agnosia, amnesia, paralysis, loss of coordination, or loss of consciousness  Medication Review  Accu-Chek FastClix Lancets,  Budeson-Glycopyrrol-Formoterol, Potassium Chloride ER, SUMAtriptan, Semaglutide, Tiotropium Bromide-Olodaterol, albuterol, amLODipine, atorvastatin, diphenhydrAMINE, empagliflozin, esomeprazole, glucose blood, hydrALAZINE, lidocaine, metFORMIN, metoprolol succinate, metoprolol tartrate, montelukast, morphine, multivitamin with minerals, naloxone, and nortriptyline  History Review  Allergy: Kristopher [name] is allergic to acetaminophen, aspirin, epinephrine, novocain [procaine], penicillins, strawberry extract, and shellfish allergy. Drug: Kristopher [name]  reports no history of drug use. Alcohol:  reports no history of alcohol use. Tobacco:  reports that he  quit smoking about 3 years ago. His smoking use included cigarettes. He has a 8.75 pack-year smoking history. He has never used smokeless tobacco. Social: Mr. Balster  reports that he quit smoking about 3 years ago. His smoking use included cigarettes. He has a 8.75 pack-year smoking history. He has never used smokeless tobacco. He reports that he does not drink alcohol and does not use drugs. Medical:  has a past medical history of Allergy, Arthritis, Benign hypertensive kidney disease, Chronic back pain, Diabetes mellitus, type 2 (HCC), Dyspnea, GERD (gastroesophageal reflux disease), Hypertension, Malignant neoplasm of lung (HCC), and Migraines. Surgical: Kristopher [name]  has a past surgical history that includes Back surgery; Appendectomy; Leg Surgery; Knee surgery (Left); Foot surgery (Left); Colonoscopy with propofol (N/A, 02/19/2016); polypectomy (N/A, 02/19/2016); Total hip arthroplasty (Left, 12/02/2017); Colonoscopy with propofol (N/A, 01/19/2018); polypectomy (01/19/2018); DG OPERATIVE LEFT HIP ([facility] HX); Joint replacement (Left, 12/2017); Video bronchoscopy (Left, 01/14/2019); Thoracotomy (Left, 01/14/2019); Flexible bronchoscopy (Bilateral, 01/20/2019); Flexible bronchoscopy (Bilateral, 01/22/2019); Electormagnetic navigation bronchoscopy (Left, 11/18/2018); Lung cancer surgery; Video bronchoscopy with endobronchial ultrasound (N/A, 10/15/2019); Video bronchoscopy with endobronchial navigation (N/A, 10/15/2019); Portacath placement (N/A, 11/11/2019); Colonoscopy with propofol (N/A, 03/16/2021); and Colonoscopy with propofol (N/A, 04/06/2021). Family: family history includes Cancer in his father; Diabetes in his sister; Thrombosis in his sister.  Laboratory Chemistry Profile   Renal Lab Results  Component Value Date   BUN 11 03/12/2022   CREATININE 0.70 05/08/2022   BCR 14 03/12/2022   GFRAA >60 12/01/2019   GFRNONAA >60 07/05/2021    Hepatic Lab Results  Component Value Date   AST 48 (H) 03/12/2022    ALT 53 (H) 03/12/2022   ALBUMIN 4.6 03/12/2022   ALKPHOS 175 (H) 03/12/2022   LIPASE 50 02/17/2019    Electrolytes Lab Results  Component Value Date   NA 142 03/12/2022   K 3.8 03/12/2022   CL 102 03/12/2022   CALCIUM 9.4 03/12/2022   MG 2.4 01/25/2019   PHOS 3.5 01/23/2019    Bone No results found for: "VD25OH", "VD125OH2TOT", "XB1478GN5", "AO1308MV7", "25OHVITD1", "25OHVITD2", "25OHVITD3", "TESTOFREE", "TESTOSTERONE"  Inflammation (CRP: Acute Phase) (ESR: Chronic Phase) Lab Results  Component Value Date   CRP 0.7 04/03/2015   ESRSEDRATE 11 10/21/2017   LATICACIDVEN 1.7 01/16/2019         Note: Above Lab results reviewed.  Recent Imaging Review  CT Chest W Contrast CLINICAL DATA:  Follow-up squamous cell carcinoma the left lung. History of left lower lobectomy, chemotherapy and radiation therapy. * Tracking Code: BO *  EXAM: CT CHEST WITH CONTRAST  TECHNIQUE: Multidetector CT imaging of the chest was performed during intravenous contrast administration.  RADIATION DOSE REDUCTION: This exam was performed according to the departmental dose-optimization program which includes automated exposure control, adjustment of the mA and/or kV according to patient size and/or use of iterative reconstruction technique.  CONTRAST:  75mL OMNIPAQUE IOHEXOL 300 MG/ML  SOLN  COMPARISON:  Prior CTs 11/06/2021, 07/03/2021 and 12/14/2020.  FINDINGS: Cardiovascular: Mild atherosclerosis of the aorta and great vessels. The heart size is normal. There is  no pericardial effusion.  Mediastinum/Nodes: There are no enlarged mediastinal, hilar or axillary lymph nodes. The thyroid gland, trachea and esophagus demonstrate no significant findings.  Lungs/Pleura: No pleural effusion or pneumothorax. Stable postsurgical changes from left lower lobectomy. There are stable radiation changes in the left perihilar region without evidence of recurrent mass lesion. Unchanged 4 mm subpleural  nodule posteriorly in the right upper lobe along the superior aspect of the major fissure on image 33/3. No new, enlarging or suspicious pulmonary nodules.  Upper abdomen: No acute findings are seen in the visualized upper abdomen. Mild contour irregularity of the liver suggesting cirrhosis.  Musculoskeletal/Chest wall: There is no chest wall mass or suspicious osseous finding. Mild spondylosis.  IMPRESSION: 1. Stable chest CT status post left lower lobectomy and radiation therapy. 2. No evidence of local recurrence or metastatic disease. 3. Mild contour irregularity of the liver suggesting cirrhosis. 4.  Aortic Atherosclerosis (ICD10-I70.0).  Electronically Signed   By: [name] Bullocks M.D.   On: 05/09/2022 09:30 Note: Reviewed        Physical Exam  General appearance: Well nourished, well developed, and well hydrated. In no apparent acute distress Mental status: Alert, oriented x 3 (person, place, & time)       Respiratory: No evidence of acute respiratory distress Eyes: PERLA Vitals: BP 131/83   Pulse 68   Temp 97.9 F (36.6 C) (Temporal)   Resp 16   Ht 5\' 10"  (1.778 m)   Wt 210 lb (95.3 kg)   SpO2 99%   BMI 30.13 kg/m  BMI: Estimated body mass index is 30.13 kg/m as calculated from the following:   Height as of this encounter: 5\' 10"  (1.778 m).   Weight as of this encounter: 210 lb (95.3 kg). Ideal: Ideal body weight: 73 kg (160 lb 15 oz) Adjusted ideal body weight: 81.9 kg (180 lb 9 oz)  Assessment   Diagnosis Status  1. Chronic pain syndrome   2. Pharmacologic therapy   3. Encounter for medication management   4. 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)   5. Encounter for chronic pain management   6. Squamous cell carcinoma of left lung (HCC)   7. Chronic lower extremity pain (2ry area of Pain) (Left)   8. Chronic upper extremity pain (3ry area of Pain) (Bilateral) (L>R)   9. Chronic use of opiate for therapeutic purpose   10.  Chronic low back pain (1ry area of Pain) (Bilateral) (L>R)    Controlled Controlled Controlled   Updated Problems: No problems updated.  Plan of Care  Problem-specific:  No problem-specific Assessment & Plan notes found for this encounter.  [name] has a current medication list which includes the following long-term medication(s): albuterol, amlodipine, atorvastatin, esomeprazole, hydralazine, metformin, metoprolol succinate, metoprolol tartrate, montelukast, morphine, [START ON 08/13/2022] morphine, [START ON 09/12/2022] morphine, [START ON 10/12/2022] morphine, nortriptyline, potassium chloride er, sumatriptan, and albuterol.  Pharmacotherapy (Medications Ordered): Meds ordered this encounter  Medications   morphine (MSIR) 15 MG tablet    Sig: Take 1 tablet (15 mg total) by mouth every 6 (six) hours as needed for moderate pain or severe pain. Must last 30 days.    Dispense:  120 tablet    Refill:  0    DO NOT: delete (not duplicate); no partial-fill (will deny script to complete), no refill request (F/U required). DISPENSE: 1 day early if closed on fill date. WARN: No CNS-depressants within 8 hrs of med.   morphine (MSIR) 15  MG tablet    Sig: Take 1 tablet (15 mg total) by mouth every 6 (six) hours as needed for moderate pain or severe pain. Must last 30 days.    Dispense:  120 tablet    Refill:  0    DO NOT: delete (not duplicate); no partial-fill (will deny script to complete), no refill request (F/U required). DISPENSE: 1 day early if closed on fill date. WARN: No CNS-depressants within 8 hrs of med.   morphine (MSIR) 15 MG tablet    Sig: Take 1 tablet (15 mg total) by mouth every 6 (six) hours as needed for moderate pain or severe pain. Must last 30 days.    Dispense:  120 tablet    Refill:  0    DO NOT: delete (not duplicate); no partial-fill (will deny script to complete), no refill request (F/U required). DISPENSE: 1 day early if closed on fill date. WARN: No  CNS-depressants within 8 hrs of med.   Orders:  No orders of the defined types were placed in this encounter.  Follow-up plan:   Return in about 3 months (around 11/11/2022) for Eval-day (M,W), (F2F), (MM).      Interventional Therapies  Risk Factors  Complex Considerations:   High Risk for SUD.    Planned  Pending:   None at this time. Claims to have anaphylactic allergy to Local Anesthetics. He was sent for testing and shown/proven not to be allergic to Lidocaine.    Under consideration:   None   Completed:   We have now called his compounding pharmacy twice, one in 2022 and on 08/13/2021 and they have confirmed that they do on her day 1 year refill prescription for his nonformulary cream.  The last prescription was sent to them on 07/31/2021 and should be good until 07/31/2022.  They have confirmed on 08/13/2021 that they are in position of this prescription. On the patient's initial evaluation he claims to have an anaphylactic allergy to lidocaine.  The patient was sent to [facility] dermatology where he was skin tested and found to have absolutely no allergies to lidocaine.  Neither ropivacaine (Naropin) nor bupivacaine (Sensorcaine) were tested.   Therapeutic  Palliative (PRN) options:   None.   Pharmacotherapy  Compounded medication was transferred on 02/06/2022.  At that time, he still had enough refills to last until 07/31/2022. Compounding pharmacy honors 1 year scripts.        Recent Visits No visits were found meeting these conditions. Showing recent visits within past 90 days and meeting all other requirements Today's Visits Date Type Provider Dept  08/07/22 Office Visit Delano Metz, MD [facility]  Showing today's visits and meeting all other requirements Future Appointments No visits were found meeting these conditions. Showing future appointments within next 90 days and meeting all other requirements  I discussed the assessment and treatment plan  with the patient. The patient was provided an opportunity to ask questions and all were answered. The patient agreed with the plan and demonstrated an understanding of the instructions.  Patient advised to call back or seek an in-person evaluation if the symptoms or condition worsens.  Duration of encounter: 30 minutes.  Total time on encounter, as per AMA guidelines included both the face-to-face and non-face-to-face time personally spent by the physician and/or other qualified health care professional(s) on the day of the encounter (includes time in activities that require the physician or other qualified health care professional and does not include time in activities normally performed  by clinical staff). Physician's time may include the following activities when performed: Preparing to see the patient (e.g., pre-charting review of records, searching for previously ordered imaging, lab work, and nerve conduction tests) Review of prior analgesic pharmacotherapies. Reviewing PMP Interpreting ordered tests (e.g., lab work, imaging, nerve conduction tests) Performing post-procedure evaluations, including interpretation of diagnostic procedures Obtaining and/or reviewing separately obtained history Performing a medically appropriate examination and/or evaluation Counseling and educating the patient/family/caregiver Ordering medications, tests, or procedures Referring and communicating with other health care professionals (when not separately reported) Documenting clinical information in the electronic or other health record Independently interpreting results (not separately reported) and communicating results to the patient/ family/caregiver Care coordination (not separately reported)  Note by: Oswaldo Done, MD Date: 08/07/2022; Time: 8:42 AM

## 2022-08-08 NOTE — Telephone Encounter (Signed)
Called and spoke to patient. He states that he has not had any procedures or surgeries recently. States the last thing he had done was his CT scan in March.

## 2022-08-19 ENCOUNTER — Other Ambulatory Visit: Payer: Self-pay | Admitting: Family Medicine

## 2022-08-20 ENCOUNTER — Other Ambulatory Visit: Payer: Self-pay | Admitting: Family Medicine

## 2022-08-20 NOTE — Telephone Encounter (Signed)
Rx 03/12/22 #360 1RF- too soon Requested Prescriptions  Pending Prescriptions Disp Refills   metFORMIN (GLUCOPHAGE) 500 MG tablet [Pharmacy Med Name: METFORMIN 500MG  TABLETS] 360 tablet 1    Sig: TAKE 2 TABLETS(1000 MG) BY MOUTH TWICE DAILY WITH A MEAL     Endocrinology:  Diabetes - Biguanides Failed - 08/19/2022 12:45 PM      Failed - B12 Level in normal range and within 720 days    No results found for: "VITAMINB12"       Passed - Cr in normal range and within 360 days    Creatinine, Ser  Date Value Ref Range Status  05/08/2022 0.70 0.61 - 1.24 mg/dL Final         Passed - HBA1C is between 0 and 7.9 and within 180 days    HB A1C (BAYER DCA - WAIVED)  Date Value Ref Range Status  06/11/2022 7.0 (H) 4.8 - 5.6 % Final    Comment:             Prediabetes: 5.7 - 6.4          Diabetes: >6.4          Glycemic control for adults with diabetes: <7.0          Passed - eGFR in normal range and within 360 days    GFR calc Af Amer  Date Value Ref Range Status  12/01/2019 >60 >60 mL/min Final   GFR, Estimated  Date Value Ref Range Status  07/05/2021 >60 >60 mL/min Final    Comment:    (NOTE) Calculated using the CKD-EPI Creatinine Equation (2021)    eGFR  Date Value Ref Range Status  03/12/2022 102 >59 mL/min/1.73 Final         Passed - Valid encounter within last 6 months    Recent Outpatient Visits           2 months ago Type 2 diabetes mellitus with stage 1 chronic kidney disease, without long-term current use of insulin (HCC)   [facility] [place], [name], DO   5 months ago Type 2 diabetes mellitus with stage 1 chronic kidney disease, without long-term current use of insulin (HCC)   [facility] [place], [name], DO   6 months ago Chronic pain syndrome   [facility] [place], [name], DO   8 months ago Type 2 diabetes mellitus with stage 1 chronic kidney disease, without long-term current use of  insulin (HCC)   [place] [facility] [place], [name], DO   11 months ago Routine general medical examination at a health care facility   [facility] [place], [place] P, DO       Future Appointments             In 2 weeks Laural Benes, Oralia Rud, DO [place] [name]            Passed - CBC within normal limits and completed in the last 12 months    WBC  Date Value Ref Range Status  03/12/2022 5.9 3.4 - 10.8 x10E3/uL Final  07/05/2021 6.5 4.0 - 10.5 K/uL Final   RBC  Date Value Ref Range Status  03/12/2022 4.98 4.14 - 5.80 x10E6/uL Final  07/05/2021 5.10 4.22 - 5.81 MIL/uL Final   Hemoglobin  Date Value Ref Range Status  03/12/2022 14.6 13.0 - 17.7 g/dL Final   Hematocrit  Date Value Ref Range  Status  03/12/2022 45.3 37.5 - 51.0 % Final   MCHC  Date Value Ref Range Status  03/12/2022 32.2 31.5 - 35.7 g/dL Final  16/12/9602 54.0 30.0 - 36.0 g/dL Final   [facility]  Date Value Ref Range Status  03/12/2022 29.3 26.6 - 33.0 pg Final  07/05/2021 29.6 26.0 - 34.0 pg Final   MCV  Date Value Ref Range Status  03/12/2022 91 79 - 97 fL Final   No results found for: "PLTCOUNTKUC", "LABPLAT", "POCPLA" RDW  Date Value Ref Range Status  03/12/2022 13.7 11.6 - 15.4 % Final

## 2022-08-20 NOTE — Telephone Encounter (Signed)
Requested Prescriptions  Pending Prescriptions Disp Refills   JARDIANCE 25 MG TABS tablet [Pharmacy Med Name: JARDIANCE 25MG  TABLETS] 90 tablet 0    Sig: TAKE 1 TABLET(25 MG) BY MOUTH DAILY BEFORE BREAKFAST     Endocrinology:  Diabetes - SGLT2 Inhibitors Passed - 08/20/2022  8:04 AM      Passed - Cr in normal range and within 360 days    Creatinine, Ser  Date Value Ref Range Status  05/08/2022 0.70 0.61 - 1.24 mg/dL Final         Passed - HBA1C is between 0 and 7.9 and within 180 days    HB A1C (BAYER DCA - WAIVED)  Date Value Ref Range Status  06/11/2022 7.0 (H) 4.8 - 5.6 % Final    Comment:             Prediabetes: 5.7 - 6.4          Diabetes: >6.4          Glycemic control for adults with diabetes: <7.0          Passed - eGFR in normal range and within 360 days    GFR calc Af Amer  Date Value Ref Range Status  12/01/2019 >60 >60 mL/min Final   GFR, Estimated  Date Value Ref Range Status  07/05/2021 >60 >60 mL/min Final    Comment:    (NOTE) Calculated using the CKD-EPI Creatinine Equation (2021)    eGFR  Date Value Ref Range Status  03/12/2022 102 >59 mL/min/1.73 Final         Passed - Valid encounter within last 6 months    Recent Outpatient Visits           2 months ago Type 2 diabetes mellitus with stage 1 chronic kidney disease, without long-term current use of insulin (HCC)   [place] [facility] [place], [name], DO   5 months ago Type 2 diabetes mellitus with stage 1 chronic kidney disease, without long-term current use of insulin (HCC)   [place] [facility] [place], [name], DO   6 months ago Chronic pain syndrome   [place] [facility] [place], [name], DO   8 months ago Type 2 diabetes mellitus with stage 1 chronic kidney disease, without long-term current use of insulin (HCC)   [place] [facility] [place], [name], DO   11 months ago Routine general medical examination at a health care  facility   [facility] [place] Dorcas Carrow, DO       Future Appointments             In 2 weeks [place], Oralia Rud, DO [place] [name]

## 2022-08-27 ENCOUNTER — Ambulatory Visit (INDEPENDENT_AMBULATORY_CARE_PROVIDER_SITE_OTHER): Payer: 59

## 2022-08-27 VITALS — Ht 70.0 in | Wt 210.0 lb

## 2022-08-27 DIAGNOSIS — Z0000 Encounter for general adult medical examination without abnormal findings: Secondary | ICD-10-CM

## 2022-08-27 NOTE — Patient Instructions (Signed)
[name] , Thank you for taking time to come for your Medicare Wellness Visit. I appreciate your ongoing commitment to your health goals. Please review the following plan we discussed and let me know if I can assist you in the future.   These are the goals we discussed:  Goals      DIET - EAT MORE FRUITS AND VEGETABLES     Patient Stated     08/18/2020, get over cancer     Patient Stated     Get over this cancer     Quit Smoking     Smoking cessation discussed        This is a list of the screening recommended for you and due dates:  Health Maintenance  Topic Date Due   Zoster (Shingles) Vaccine (1 of 2) Never done   Yearly kidney health urinalysis for diabetes  09/08/2022   Eye exam for diabetics  08/30/2022   Complete foot exam   09/08/2022   Flu Shot  10/03/2022   Hemoglobin A1C  12/11/2022   Yearly kidney function blood test for diabetes  03/13/2023   Medicare Annual Wellness Visit  08/27/2023   Colon Cancer Screening  04/06/2026   DTaP/Tdap/Td vaccine (3 - Td or Tdap) 06/04/2027   Hepatitis C Screening  Completed   HIV Screening  Completed   HPV Vaccine  Aged Out   COVID-19 Vaccine  Discontinued    Advanced directives: yes, copies requested   Conditions/risks identified: none  Next appointment: Follow up in one year for your annual wellness visit 09/01/23 @ 1:30 pm by phone  Preventive Care 40-64 Years, Male Preventive care refers to lifestyle choices and visits with your health care provider that can promote health and wellness. What does preventive care include? A yearly physical exam. This is also called an annual well check. Dental exams once or twice a year. Routine eye exams. Ask your health care provider how often you should have your eyes checked. Personal lifestyle choices, including: Daily care of your teeth and gums. Regular physical activity. Eating a healthy diet. Avoiding tobacco and drug use. Limiting alcohol use. Practicing safe  sex. Taking low-dose aspirin every day starting at age 42. What happens during an annual well check? The services and screenings done by your health care provider during your annual well check will depend on your age, overall health, lifestyle risk factors, and family history of disease. Counseling  Your health care provider may ask you questions about your: Alcohol use. Tobacco use. Drug use. Emotional well-being. Home and relationship well-being. Sexual activity. Eating habits. Work and work Astronomer. Screening  You may have the following tests or measurements: Height, weight, and BMI. Blood pressure. Lipid and cholesterol levels. These may be checked every 5 years, or more frequently if you are over 14 years old. Skin check. Lung cancer screening. You may have this screening every year starting at age 83 if you have a 30-pack-year history of smoking and currently smoke or have quit within the past 15 years. Fecal occult blood test (FOBT) of the stool. You may have this test every year starting at age 64. Flexible sigmoidoscopy or colonoscopy. You may have a sigmoidoscopy every 5 years or a colonoscopy every 10 years starting at age 5. Prostate cancer screening. Recommendations will vary depending on your family history and other risks. Hepatitis C blood test. Hepatitis B blood test. Sexually transmitted disease (STD) testing. Diabetes screening. This is done by checking your blood sugar (glucose) after  you have not eaten for a while (fasting). You may have this done every 1-3 years. Discuss your test results, treatment options, and if necessary, the need for more tests with your health care provider. Vaccines  Your health care provider may recommend certain vaccines, such as: Influenza vaccine. This is recommended every year. Tetanus, diphtheria, and acellular pertussis (Tdap, Td) vaccine. You may need a Td booster every 10 years. Zoster vaccine. You may need this after age  68. Pneumococcal 13-valent conjugate (PCV13) vaccine. You may need this if you have certain conditions and have not been vaccinated. Pneumococcal polysaccharide (PPSV23) vaccine. You may need one or two doses if you smoke cigarettes or if you have certain conditions. Talk to your health care provider about which screenings and vaccines you need and how often you need them. This information is not intended to replace advice given to you by your health care provider. Make sure you discuss any questions you have with your health care provider. Document Released: 03/17/2015 Document Revised: 11/08/2015 Document Reviewed: 12/20/2014 Elsevier Interactive Patient Education  2017 [employer].  Fall Prevention in the Home Falls can cause injuries. They can happen to people of all ages. There are many things you can do to make your home safe and to help prevent falls. What can I do on the outside of my home? Regularly fix the edges of walkways and driveways and fix any cracks. Remove anything that might make you trip as you walk through a door, such as a raised step or threshold. Trim any bushes or trees on the path to your home. Use bright outdoor lighting. Clear any walking paths of anything that might make someone trip, such as rocks or tools. Regularly check to see if handrails are loose or broken. Make sure that both sides of any steps have handrails. Any raised decks and porches should have guardrails on the edges. Have any leaves, snow, or ice cleared regularly. Use sand or salt on walking paths during winter. Clean up any spills in your garage right away. This includes oil or grease spills. What can I do in the bathroom? Use night lights. Install grab bars by the toilet and in the tub and shower. Do not use towel bars as grab bars. Use non-skid mats or decals in the tub or shower. If you need to sit down in the shower, use a plastic, non-slip stool. Keep the floor dry. Clean up any water  that spills on the floor as soon as it happens. Remove soap buildup in the tub or shower regularly. Attach bath mats securely with double-sided non-slip rug tape. Do not have throw rugs and other things on the floor that can make you trip. What can I do in the bedroom? Use night lights. Make sure that you have a light by your bed that is easy to reach. Do not use any sheets or blankets that are too big for your bed. They should not hang down onto the floor. Have a firm chair that has side arms. You can use this for support while you get dressed. Do not have throw rugs and other things on the floor that can make you trip. What can I do in the kitchen? Clean up any spills right away. Avoid walking on wet floors. Keep items that you use a lot in easy-to-reach places. If you need to reach something above you, use a strong step stool that has a grab bar. Keep electrical cords out of the way. Do not  use floor polish or wax that makes floors slippery. If you must use wax, use non-skid floor wax. Do not have throw rugs and other things on the floor that can make you trip. What can I do with my stairs? Do not leave any items on the stairs. Make sure that there are handrails on both sides of the stairs and use them. Fix handrails that are broken or loose. Make sure that handrails are as long as the stairways. Check any carpeting to make sure that it is firmly attached to the stairs. Fix any carpet that is loose or worn. Avoid having throw rugs at the top or bottom of the stairs. If you do have throw rugs, attach them to the floor with carpet tape. Make sure that you have a light switch at the top of the stairs and the bottom of the stairs. If you do not have them, ask someone to add them for you. What else can I do to help prevent falls? Wear shoes that: Do not have high heels. Have rubber bottoms. Are comfortable and fit you well. Are closed at the toe. Do not wear sandals. If you use a  stepladder: Make sure that it is fully opened. Do not climb a closed stepladder. Make sure that both sides of the stepladder are locked into place. Ask someone to hold it for you, if possible. Clearly mark and make sure that you can see: Any grab bars or handrails. First and last steps. Where the edge of each step is. Use tools that help you move around (mobility aids) if they are needed. These include: Canes. Walkers. Scooters. Crutches. Turn on the lights when you go into a dark area. Replace any light bulbs as soon as they burn out. Set up your furniture so you have a clear path. Avoid moving your furniture around. If any of your floors are uneven, fix them. If there are any pets around you, be aware of where they are. Review your medicines with your doctor. Some medicines can make you feel dizzy. This can increase your chance of falling. Ask your doctor what other things that you can do to help prevent falls. This information is not intended to replace advice given to you by your health care provider. Make sure you discuss any questions you have with your health care provider. Document Released: 12/15/2008 Document Revised: 07/27/2015 Document Reviewed: 03/25/2014 Elsevier Interactive Patient Education  2017 [employer].

## 2022-08-27 NOTE — Progress Notes (Signed)
Subjective:   Kristopher [name] is a 62 y.o. male who presents for Medicare Annual/Subsequent preventive examination.  Visit Complete: Virtual  I connected with  [name] on 08/27/22 by a audio enabled telemedicine application and verified that I am speaking with the correct person using two identifiers.  Patient Location: Home  Provider Location: Office/Clinic  I discussed the limitations of evaluation and management by telemedicine. The patient expressed understanding and agreed to proceed.   Review of Systems     Cardiac Risk Factors include: diabetes mellitus;male gender;obesity (BMI >30kg/m2);dyslipidemia     Objective:    Today's Vitals   08/27/22 0822 08/27/22 0838  Weight:  210 lb (95.3 kg)  Height:  5\' 10"  (1.778 m)  PainSc: 3     Body mass index is 30.13 kg/m.     08/27/2022    8:29 AM 08/07/2022    8:22 AM 05/14/2022   11:01 AM 05/08/2022    8:18 AM 11/12/2021    8:01 AM 11/08/2021   10:56 AM 11/08/2021   10:55 AM  Advanced Directives  Does Patient Have a Medical Advance Directive? Yes Yes Yes Yes No Yes No  Type of Estate agent of [place];Living will  Healthcare Power of [place];Living will Healthcare Power of [place];Living will  Living will;Healthcare Power of Attorney   Does patient want to make changes to medical advance directive?      No - Patient declined No - Patient declined  Copy of Healthcare Power of Attorney in Chart? No - copy requested  No - copy requested   No - copy requested   Would patient like information on creating a medical advance directive?      No - Patient declined No - Patient declined    Current Medications (verified) Outpatient Encounter Medications as of 08/27/2022  Medication Sig   Accu-Chek FastClix Lancets MISC USE TO TEST BLOOD SUGAR 2X A DAY   albuterol (PROVENTIL) (2.5 MG/3ML) 0.083% nebulizer solution USE 1 VIAL VIA NEBULIZER EVERY 6 HOURS AS NEEDED FOR WHEEZING OR SHORTNESS OF BREATH    amLODipine (NORVASC) 5 MG tablet TAKE 1 TABLET(5 MG) BY MOUTH TWICE DAILY   atorvastatin (LIPITOR) 10 MG tablet Take 1 tablet (10 mg total) by mouth daily at 6 PM.   BREZTRI AEROSPHERE 160-9-4.8 MCG/ACT AERO Inhale 2 puffs into the lungs 2 (two) times daily. Maintenance per pulmonology   diphenhydrAMINE (BENADRYL) 25 MG tablet Take 75 mg by mouth in the morning and at bedtime.   empagliflozin (JARDIANCE) 25 MG TABS tablet TAKE 1 TABLET(25 MG) BY MOUTH DAILY BEFORE BREAKFAST   esomeprazole (NEXIUM) 40 MG capsule TAKE 1 CAPSULE(40 MG) BY MOUTH DAILY   glucose blood (ACCU-CHEK GUIDE) test strip USE TO TEST BLOOD SUGAR 2X A DAY   hydrALAZINE (APRESOLINE) 100 MG tablet Take 1 tablet (100 mg total) by mouth 2 (two) times daily.   lidocaine (LIDODERM) 5 % UNWRAP AND APPLY 1 PATCH ONTO THE SKIN DAILY. REMOVE AND DISCARD PATCH WITHIN 12 HOURS OR AS DIRECTED BY MD   metFORMIN (GLUCOPHAGE) 500 MG tablet TAKE 2 TABLETS(1000 MG) BY MOUTH TWICE DAILY WITH A MEAL   metoprolol succinate (TOPROL-XL) 100 MG 24 hr tablet TAKE 1 TABLET BY MOUTH TWICE DAILY WITH OR IMMEDIATELY FOLLOWING A MEAL; TO BE TAKEN WITH 50 MG METOPROLOL TARTRATE   metoprolol tartrate (LOPRESSOR) 50 MG tablet Take 1 tablet (50 mg total) by mouth 2 (two) times daily. TO BE TAKEN WITH 100 MG METOPROLOL TARTRATE  montelukast (SINGULAIR) 10 MG tablet TAKE 1 TABLET(10 MG) BY MOUTH DAILY   morphine (MSIR) 15 MG tablet Take 1 tablet (15 mg total) by mouth every 6 (six) hours as needed for moderate pain or severe pain. Must last 30 days.   [START ON 09/12/2022] morphine (MSIR) 15 MG tablet Take 1 tablet (15 mg total) by mouth every 6 (six) hours as needed for moderate pain or severe pain. Must last 30 days.   Multiple Vitamin (MULTIVITAMIN WITH MINERALS) TABS tablet Take 1 tablet by mouth daily.   naloxone (NARCAN) nasal spray 4 mg/0.1 mL Place 1 spray into the nose as needed for up to 365 doses (for opioid-induced respiratory depresssion). In case of  emergency (overdose), spray once into each nostril. If no response within 3 minutes, repeat application and call 911.   nortriptyline (PAMELOR) 25 MG capsule TAKE 2 CAPSULES(50 MG) BY MOUTH AT BEDTIME   Potassium Chloride ER 20 MEQ TBCR Take 1 tablet by mouth daily.   Semaglutide (RYBELSUS) 14 MG TABS Take 1 tablet (14 mg total) by mouth daily.   STIOLTO RESPIMAT 2.5-2.5 MCG/ACT AERS Inhale 2 puffs into the lungs 4 (four) times daily as needed. Prn per pulmonology notes   SUMAtriptan (IMITREX) 50 MG tablet Take 1 tablet (50 mg total) by mouth every 2 (two) hours as needed for migraine. Take 1 tab at onset of migraine. May repeat in 2 hours if headache persists or recurs.   albuterol (VENTOLIN HFA) 108 (90 Base) MCG/ACT inhaler Inhale into the lungs.   morphine (MSIR) 15 MG tablet Take 1 tablet (15 mg total) by mouth every 6 (six) hours as needed for moderate pain or severe pain. Must last 30 days.   [START ON 10/12/2022] morphine (MSIR) 15 MG tablet Take 1 tablet (15 mg total) by mouth every 6 (six) hours as needed for moderate pain or severe pain. Must last 30 days. (Patient not taking: Reported on 08/27/2022)   Facility-Administered Encounter Medications as of 08/27/2022  Medication   0.9 %  sodium chloride infusion   sodium chloride flush (NS) 0.9 % injection 10 mL    Allergies (verified) Acetaminophen, Aspirin, Epinephrine, Novocain [procaine], Penicillins, Strawberry extract, and Shellfish allergy   History: Past Medical History:  Diagnosis Date   Allergy    Arthritis    left foot   Benign hypertensive kidney disease    Chronic back pain    Four rods in back   Diabetes mellitus, type 2 (HCC)    Dyspnea    GERD (gastroesophageal reflux disease)    Hypertension    Malignant neoplasm of lung (HCC)    Migraines    daily   Past Surgical History:  Procedure Laterality Date   APPENDECTOMY     BACK SURGERY     COLONOSCOPY WITH PROPOFOL N/A 02/19/2016   Procedure: COLONOSCOPY WITH  PROPOFOL;  Surgeon: Midge Minium, MD;  Location: [facility];  Service: Endoscopy;  Laterality: N/A;   COLONOSCOPY WITH PROPOFOL N/A 01/19/2018   Procedure: COLONOSCOPY WITH PROPOFOL;  Surgeon: Midge Minium, MD;  Location: [facility];  Service: Endoscopy;  Laterality: N/A;  Diabetic - oral meds   COLONOSCOPY WITH PROPOFOL N/A 03/16/2021   Procedure: COLONOSCOPY WITH PROPOFOL;  Surgeon: Midge Minium, MD;  Location: [facility];  Service: Endoscopy;  Laterality: N/A;  INADEQUATE PREP   COLONOSCOPY WITH PROPOFOL N/A 04/06/2021   Procedure: COLONOSCOPY WITH PROPOFOL;  Surgeon: Midge Minium, MD;  Location: [facility];  Service: Endoscopy;  Laterality:  N/A;   DG OPERATIVE LEFT HIP ([facility] HX)     10/19   ELECTROMAGNETIC NAVIGATION BROCHOSCOPY Left 11/18/2018   Procedure: ELECTROMAGNETIC NAVIGATION BRONCHOSCOPY;  Surgeon: Salena Saner, MD;  Location: [facility] ORS;  Service: Cardiopulmonary;  Laterality: Left;   FLEXIBLE BRONCHOSCOPY Bilateral 01/20/2019   Procedure: FLEXIBLE BRONCHOSCOPY;  Surgeon: Vida Rigger, MD;  Location: [facility] ORS;  Service: Thoracic;  Laterality: Bilateral;   FLEXIBLE BRONCHOSCOPY Bilateral 01/22/2019   Procedure: FLEXIBLE BRONCHOSCOPY;  Surgeon: Vida Rigger, MD;  Location: [facility] ORS;  Service: Thoracic;  Laterality: Bilateral;   FOOT SURGERY Left    Screws and plates   JOINT REPLACEMENT Left 12/2017   DR Rosita Kea Hip   KNEE SURGERY Left    X 2   LEG SURGERY     LUNG CANCER SURGERY     POLYPECTOMY N/A 02/19/2016   Procedure: POLYPECTOMY;  Surgeon: Midge Minium, MD;  Location: [facility];  Service: Endoscopy;  Laterality: N/A;   POLYPECTOMY  01/19/2018   Procedure: POLYPECTOMY;  Surgeon: Midge Minium, MD;  Location: [facility];  Service: Endoscopy;;   PORTACATH PLACEMENT N/A 11/11/2019   Procedure: INSERTION PORT-A-CATH;  Surgeon: Hulda Marin, MD;  Location: [facility] ORS;  Service: General;  Laterality: N/A;   THORACOTOMY Left  01/14/2019   Procedure: THORACOTOMY MAJOR, LEFT;  Surgeon: Hulda Marin, MD;  Location: [facility] ORS;  Service: General;  Laterality: Left;   TOTAL HIP ARTHROPLASTY Left 12/02/2017   Procedure: TOTAL HIP ARTHROPLASTY ANTERIOR APPROACH;  Surgeon: Kennedy Bucker, MD;  Location: [facility] ORS;  Service: Orthopedics;  Laterality: Left;   VIDEO BRONCHOSCOPY Left 01/14/2019   Procedure: VIDEO BRONCHOSCOPY WITH FLUORO, LEFT;  Surgeon: Hulda Marin, MD;  Location: [facility] ORS;  Service: General;  Laterality: Left;   VIDEO BRONCHOSCOPY WITH ENDOBRONCHIAL NAVIGATION N/A 10/15/2019   Procedure: VIDEO BRONCHOSCOPY WITH ENDOBRONCHIAL NAVIGATION;  Surgeon: Vida Rigger, MD;  Location: [facility] ORS;  Service: Thoracic;  Laterality: N/A;   VIDEO BRONCHOSCOPY WITH ENDOBRONCHIAL ULTRASOUND N/A 10/15/2019   Procedure: VIDEO BRONCHOSCOPY WITH ENDOBRONCHIAL ULTRASOUND;  Surgeon: Vida Rigger, MD;  Location: [facility] ORS;  Service: Thoracic;  Laterality: N/A;   Family History  Problem Relation Age of Onset   Cancer Father    Diabetes Sister    Thrombosis Sister    Social History   Socioeconomic History   Marital status: Significant Other    Spouse name: Not on file   Number of children: Not on file   Years of education: Not on file   Highest education level: Not on file  Occupational History   Occupation: disability  Tobacco Use   Smoking status: Former    Packs/day: 0.25    Years: 35.00    Additional pack years: 0.00    Total pack years: 8.75    Types: Cigarettes    Quit date: 01/14/2019    Years since quitting: 3.6   Smokeless tobacco: Never  Vaping Use   Vaping Use: Never used  Substance and Sexual Activity   Alcohol use: No    Alcohol/week: 0.0 standard drinks of alcohol   Drug use: Never   Sexual activity: Yes  Other Topics Concern   Not on file  Social History Narrative   Not on file   Social Determinants of Health   Financial Resource Strain: Low Risk  (08/27/2022)   Overall Financial Resource  Strain (CARDIA)    Difficulty of Paying Living Expenses: Not hard at all  Food Insecurity: No Food Insecurity (08/27/2022)   Hunger Vital Sign  Worried About Programme researcher, broadcasting/film/video in the Last Year: Never true    Ran Out of Food in the Last Year: Never true  Transportation Needs: No Transportation Needs (08/27/2022)   PRAPARE - Administrator, Civil Service (Medical): No    Lack of Transportation (Non-Medical): No  Physical Activity: Insufficiently Active (08/27/2022)   Exercise Vital Sign    Days of Exercise per Week: 4 days    Minutes of Exercise per Session: 30 min  Stress: No Stress Concern Present (08/27/2022)   [employer] of Occupational Health - Occupational Stress Questionnaire    Feeling of Stress : Not at all  Social Connections: Moderately Isolated (08/27/2022)   Social Connection and Isolation Panel [NHANES]    Frequency of Communication with Friends and Family: More than three times a week    Frequency of Social Gatherings with Friends and Family: Not on file    Attends Religious Services: Never    Database administrator or Organizations: No    Attends Engineer, structural: Never    Marital Status: Married    Tobacco Counseling Counseling given: Not Answered   Clinical Intake:  Pre-visit preparation completed: Yes  Pain : 0-10 Pain Score: 3  Pain Type: Chronic pain Pain Location: Back Pain Radiating Towards: left leg     Nutritional Risks: None Diabetes: Yes CBG done?: No Did pt. bring in CBG monitor from home?: No  How often do you need to have someone help you when you read instructions, pamphlets, or other written materials from your doctor or pharmacy?: 1 - Never  Interpreter Needed?: No  Information entered by :: Kennedy Bucker, LPN   Activities of Daily Living    08/27/2022    8:23 AM  In your present state of health, do you have any difficulty performing the following activities:  Hearing? 0  Vision? 0  Comment  readers  Difficulty concentrating or making decisions? 0  Walking or climbing stairs? 0  Dressing or bathing? 0  Doing errands, shopping? 0  Preparing Food and eating ? N  Using the Toilet? N  In the past six months, have you accidently leaked urine? N  Do you have problems with loss of bowel control? N  Managing your Medications? N  Managing your Finances? N  Housekeeping or managing your Housekeeping? N    Patient Care Team: Dorcas Carrow, DO as PCP - General (Family Medicine) Rayna Sexton, CMA (Inactive) as Certified Medical Assistant Glory Buff, RN as Registered Nurse Delano Metz, MD as Referring Physician (Pain Medicine) Jeralyn Ruths, MD as Consulting Physician (Oncology) Carmina Miller, MD as Consulting Physician (Radiation Oncology)  Indicate any recent Medical Services you may have received from other than [facility] providers in the past year (date may be approximate).     Assessment:   This is a routine wellness examination for [place].  Hearing/Vision screen Hearing Screening - Comments:: No aids  Vision Screening - Comments:: Readers- [facility]  Dietary issues and exercise activities discussed:     Goals Addressed             This Visit's Progress    DIET - EAT MORE FRUITS AND VEGETABLES         Depression Screen    08/27/2022    8:27 AM 08/07/2022    8:22 AM 12/10/2021    8:37 AM 11/12/2021    8:01 AM 09/07/2021   10:08 AM 08/20/2021    8:42 AM 08/13/2021  8:20 AM  PHQ 2/9 Scores  PHQ - 2 Score 0 0  0 0 0 0  PHQ- 9 Score 0        Exception Documentation   Patient refusal        Fall Risk    08/27/2022    8:30 AM 08/07/2022    8:22 AM 05/08/2022    8:18 AM 02/06/2022    8:17 AM 11/12/2021    8:01 AM  Fall Risk   Falls in the past year? 0 0 0 0 0  Number falls in past yr: 0   0   Injury with Fall? 0      Risk for fall due to : No Fall Risks   No Fall Risks   Follow up Falls prevention discussed;Falls evaluation  completed   Falls evaluation completed     MEDICARE RISK AT HOME:  Medicare Risk at Home - 08/27/22 0830     Any stairs in or around the home? No    If so, are there any without handrails? No    Home free of loose throw rugs in walkways, pet beds, electrical cords, etc? Yes    Adequate lighting in your home to reduce risk of falls? Yes    Life alert? No    Use of a cane, walker or w/c? Yes   cane- once in a while   Grab bars in the bathroom? Yes    Shower chair or bench in shower? No    Elevated toilet seat or a handicapped toilet? No             TIMED UP AND GO:  Was the test performed?  No    Cognitive Function:        08/27/2022    8:31 AM 08/20/2021    8:34 AM 08/18/2020    8:24 AM 08/04/2017    8:12 AM 06/24/2016    9:44 AM  6CIT Screen  What Year? 0 points 0 points 0 points 0 points 0 points  What month? 0 points 0 points 0 points 0 points 0 points  What time? 0 points 0 points 0 points 0 points 0 points  Count back from 20 2 points 2 points 0 points 0 points 0 points  Months in reverse 4 points 4 points 4 points 0 points 4 points  Repeat phrase 2 points 2 points 6 points 2 points 4 points  Total Score 8 points 8 points 10 points 2 points 8 points    Immunizations Immunization History  Administered Date(s) Administered   Pneumococcal Polysaccharide-23 06/24/2016   Tdap 03/04/2013, 06/03/2017    TDAP status: Up to date  Flu Vaccine status: Declined, Education has been provided regarding the importance of this vaccine but patient still declined. Advised may receive this vaccine at local pharmacy or Health Dept. Aware to provide a copy of the vaccination record if obtained from local pharmacy or Health Dept. Verbalized acceptance and understanding.  Pneumococcal vaccine status: Up to date  Covid-19 vaccine status: Declined, Education has been provided regarding the importance of this vaccine but patient still declined. Advised may receive this vaccine at local  pharmacy or Health Dept.or vaccine clinic. Aware to provide a copy of the vaccination record if obtained from local pharmacy or Health Dept. Verbalized acceptance and understanding.  Qualifies for Shingles Vaccine? Yes   Zostavax completed No   Shingrix Completed?: No.    Education has been provided regarding the importance of this vaccine. Patient has been  advised to call insurance company to determine out of pocket expense if they have not yet received this vaccine. Advised may also receive vaccine at local pharmacy or Health Dept. Verbalized acceptance and understanding.  Screening Tests Health Maintenance  Topic Date Due   Zoster Vaccines- Shingrix (1 of 2) Never done   Diabetic kidney evaluation - Urine ACR  09/08/2022   OPHTHALMOLOGY EXAM  08/30/2022   FOOT EXAM  09/08/2022   INFLUENZA VACCINE  10/03/2022   HEMOGLOBIN A1C  12/11/2022   Diabetic kidney evaluation - eGFR measurement  03/13/2023   Medicare Annual Wellness (AWV)  08/27/2023   Colonoscopy  04/06/2026   DTaP/Tdap/Td (3 - Td or Tdap) 06/04/2027   Hepatitis C Screening  Completed   HIV Screening  Completed   HPV VACCINES  Aged Out   COVID-19 Vaccine  Discontinued    Health Maintenance  Health Maintenance Due  Topic Date Due   Zoster Vaccines- Shingrix (1 of 2) Never done   Diabetic kidney evaluation - Urine ACR  09/08/2022    Colorectal cancer screening: Type of screening: Colonoscopy. Completed 04/06/21. Repeat every 5 years  Lung Cancer Screening: (Low Dose CT Chest recommended if Age 12-80 years, 20 pack-year currently smoking OR have quit w/in 15years.) does qualify.   Lung Cancer Screening Referral: has lung cancer  Additional Screening:  Hepatitis C Screening: does qualify; Completed 06/24/16  Vision Screening: Recommended annual ophthalmology exams for early detection of glaucoma and other disorders of the eye. Is the patient up to date with their annual eye exam?  Yes  Who is the provider or what is  the name of the office in which the patient attends annual eye exams? [facility] If pt is not established with a provider, would they like to be referred to a provider to establish care? No .   Dental Screening: Recommended annual dental exams for proper oral hygiene  Diabetic Foot Exam: Diabetic Foot Exam: Completed 09/07/21  Community Resource Referral / Chronic Care Management: CRR required this visit?  No   CCM required this visit?  No     Plan:     I have personally reviewed and noted the following in the patient's chart:   Medical and social history Use of alcohol, tobacco or illicit drugs  Current medications and supplements including opioid prescriptions. Patient is currently taking opioid prescriptions. Information provided to patient regarding non-opioid alternatives. Patient advised to discuss non-opioid treatment plan with their provider. Functional ability and status Nutritional status Physical activity Advanced directives List of other physicians Hospitalizations, surgeries, and ER visits in previous 12 months Vitals Screenings to include cognitive, depression, and falls Referrals and appointments  In addition, I have reviewed and discussed with patient certain preventive protocols, quality metrics, and best practice recommendations. A written personalized care plan for preventive services as well as general preventive health recommendations were provided to patient.     Hal Hope, LPN   10/30/9369   After Visit Summary: (MyChart) Due to this being a telephonic visit, the after visit summary with patients personalized plan was offered to patient via MyChart   Nurse Notes: none

## 2022-09-03 ENCOUNTER — Encounter: Payer: Self-pay | Admitting: Family Medicine

## 2022-09-03 ENCOUNTER — Ambulatory Visit (INDEPENDENT_AMBULATORY_CARE_PROVIDER_SITE_OTHER): Payer: 59 | Admitting: Family Medicine

## 2022-09-03 VITALS — BP 120/77 | HR 69 | Temp 97.6°F | Ht 69.0 in | Wt 221.0 lb

## 2022-09-03 DIAGNOSIS — N181 Chronic kidney disease, stage 1: Secondary | ICD-10-CM

## 2022-09-03 DIAGNOSIS — E785 Hyperlipidemia, unspecified: Secondary | ICD-10-CM

## 2022-09-03 DIAGNOSIS — I129 Hypertensive chronic kidney disease with stage 1 through stage 4 chronic kidney disease, or unspecified chronic kidney disease: Secondary | ICD-10-CM | POA: Diagnosis not present

## 2022-09-03 DIAGNOSIS — E1122 Type 2 diabetes mellitus with diabetic chronic kidney disease: Secondary | ICD-10-CM | POA: Diagnosis not present

## 2022-09-03 DIAGNOSIS — J439 Emphysema, unspecified: Secondary | ICD-10-CM | POA: Diagnosis not present

## 2022-09-03 DIAGNOSIS — F1120 Opioid dependence, uncomplicated: Secondary | ICD-10-CM

## 2022-09-03 DIAGNOSIS — Z7984 Long term (current) use of oral hypoglycemic drugs: Secondary | ICD-10-CM | POA: Diagnosis not present

## 2022-09-03 DIAGNOSIS — E1169 Type 2 diabetes mellitus with other specified complication: Secondary | ICD-10-CM | POA: Diagnosis not present

## 2022-09-03 DIAGNOSIS — I700 Atherosclerosis of aorta: Secondary | ICD-10-CM | POA: Diagnosis not present

## 2022-09-03 LAB — MICROALBUMIN, URINE WAIVED
Creatinine, Urine Waived: 50 mg/dL (ref 10–300)
Microalb, Ur Waived: 80 mg/L — ABNORMAL HIGH (ref 0–19)

## 2022-09-03 LAB — BAYER DCA HB A1C WAIVED: HB A1C (BAYER DCA - WAIVED): 6.5 % — ABNORMAL HIGH (ref 4.8–5.6)

## 2022-09-03 MED ORDER — METFORMIN HCL 500 MG PO TABS: ORAL_TABLET | ORAL | 1 refills | Status: DC

## 2022-09-03 MED ORDER — EMPAGLIFLOZIN 25 MG PO TABS: ORAL_TABLET | ORAL | 1 refills | Status: DC

## 2022-09-03 MED ORDER — RYBELSUS 14 MG PO TABS: 14.0000 mg | ORAL_TABLET | Freq: Every day | ORAL | 1 refills | Status: DC

## 2022-09-03 MED ORDER — STIOLTO RESPIMAT 2.5-2.5 MCG/ACT IN AERS: 2.0000 | INHALATION_SPRAY | Freq: Four times a day (QID) | RESPIRATORY_TRACT | 12 refills | Status: DC | PRN

## 2022-09-03 MED ORDER — HYDRALAZINE HCL 100 MG PO TABS: 100.0000 mg | ORAL_TABLET | Freq: Two times a day (BID) | ORAL | 1 refills | Status: DC

## 2022-09-03 MED ORDER — AMLODIPINE BESYLATE 5 MG PO TABS: ORAL_TABLET | ORAL | 1 refills | Status: DC

## 2022-09-03 MED ORDER — METOPROLOL TARTRATE 50 MG PO TABS: 50.0000 mg | ORAL_TABLET | Freq: Two times a day (BID) | ORAL | 1 refills | Status: DC

## 2022-09-03 MED ORDER — METOPROLOL SUCCINATE ER 100 MG PO TB24: ORAL_TABLET | ORAL | 1 refills | Status: DC

## 2022-09-03 MED ORDER — BREZTRI AEROSPHERE 160-9-4.8 MCG/ACT IN AERO: 2.0000 | INHALATION_SPRAY | Freq: Two times a day (BID) | RESPIRATORY_TRACT | 6 refills | Status: DC

## 2022-09-03 MED ORDER — ESOMEPRAZOLE MAGNESIUM 40 MG PO CPDR: DELAYED_RELEASE_CAPSULE | ORAL | 3 refills | Status: DC

## 2022-09-03 MED ORDER — MONTELUKAST SODIUM 10 MG PO TABS: ORAL_TABLET | ORAL | 1 refills | Status: DC

## 2022-09-03 MED ORDER — POTASSIUM CHLORIDE ER 20 MEQ PO TBCR: 1.0000 | EXTENDED_RELEASE_TABLET | Freq: Every day | ORAL | 1 refills | Status: DC

## 2022-09-03 MED ORDER — ALBUTEROL SULFATE (2.5 MG/3ML) 0.083% IN NEBU: INHALATION_SOLUTION | RESPIRATORY_TRACT | 1 refills | Status: DC

## 2022-09-03 MED ORDER — ATORVASTATIN CALCIUM 10 MG PO TABS: 10.0000 mg | ORAL_TABLET | Freq: Every day | ORAL | 1 refills | Status: DC

## 2022-09-03 MED ORDER — SUMATRIPTAN SUCCINATE 50 MG PO TABS: 50.0000 mg | ORAL_TABLET | ORAL | 12 refills | Status: DC | PRN

## 2022-09-03 MED ORDER — NORTRIPTYLINE HCL 25 MG PO CAPS: ORAL_CAPSULE | ORAL | 1 refills | Status: DC

## 2022-09-03 NOTE — Assessment & Plan Note (Signed)
Doing well with A1c of 6.5. Continue current regimen. Continue to monitor. Call with any concerns.  

## 2022-09-03 NOTE — Progress Notes (Signed)
BP 120/77   Pulse 69   Temp 97.6 F (36.4 C) (Oral)   Ht 5\' 9"  (1.753 m)   Wt 221 lb (100.2 kg)   SpO2 96%   BMI 32.64 kg/m    Subjective:    Patient ID: Kristopher [name], male    DOB: [birth year], 61 y.o.   MRN: [id number]  HPI: Kristopher [name] is a 62 y.o. male  Chief Complaint  Patient presents with   Hypertension   Hyperlipidemia   Diabetes   DIABETES Hypoglycemic episodes:no Polydipsia/polyuria: no Visual disturbance: no Chest pain: no Paresthesias: no Glucose Monitoring: yes Taking Insulin?: no Blood Pressure Monitoring: not checking Retinal Examination: Up to Date Foot Exam: Up to Date Diabetic Education: Completed Pneumovax: Up to Date Influenza: Up to Date Aspirin: no  HYPERTENSION / HYPERLIPIDEMIA Satisfied with current treatment? yes Duration of hypertension: chronic BP monitoring frequency: not checking BP medication side effects: no Past BP meds: amlodipine, hydralazine, metoprolol Duration of hyperlipidemia: chronic Cholesterol medication side effects: no Cholesterol supplements: none Past cholesterol medications: atorvastatin Medication compliance: excellent compliance Aspirin: no Recent stressors: no Recurrent headaches: no Visual changes: no Palpitations: no Dyspnea: no Chest pain: no Lower extremity edema: no Dizzy/lightheaded: no  Relevant past medical, surgical, family and social history reviewed and updated as indicated. Interim medical history since our last visit reviewed. Allergies and medications reviewed and updated.  Review of Systems  Constitutional: Negative.   Respiratory: Negative.    Cardiovascular: Negative.   Gastrointestinal: Negative.   Musculoskeletal: Negative.   Neurological: Negative.   Psychiatric/Behavioral: Negative.      Per HPI unless specifically indicated above     Objective:    BP 120/77   Pulse 69   Temp 97.6 F (36.4 C) (Oral)   Ht 5\' 9"  (1.753 m)   Wt 221 lb (100.2 kg)   SpO2 96%    BMI 32.64 kg/m   Wt Readings from Last 3 Encounters:  09/03/22 221 lb (100.2 kg)  08/27/22 210 lb (95.3 kg)  08/07/22 210 lb (95.3 kg)    Physical Exam Vitals and nursing note reviewed.  Constitutional:      General: He is not in acute distress.    Appearance: Normal appearance. He is obese. He is not ill-appearing, toxic-appearing or diaphoretic.  HENT:     Head: Normocephalic and atraumatic.     Right Ear: External ear normal.     Left Ear: External ear normal.     Nose: Nose normal.     Mouth/Throat:     Mouth: Mucous membranes are moist.     Pharynx: Oropharynx is clear.  Eyes:     General: No scleral icterus.       Right eye: No discharge.        Left eye: No discharge.     Extraocular Movements: Extraocular movements intact.     Conjunctiva/sclera: Conjunctivae normal.     Pupils: Pupils are equal, round, and reactive to light.  Cardiovascular:     Rate and Rhythm: Normal rate and regular rhythm.     Pulses: Normal pulses.     Heart sounds: Normal heart sounds. No murmur heard.    No friction rub. No gallop.  Pulmonary:     Effort: Pulmonary effort is normal. No respiratory distress.     Breath sounds: Normal breath sounds. No stridor. No wheezing, rhonchi or rales.  Chest:     Chest wall: No tenderness.  Musculoskeletal:  General: Normal range of motion.     Cervical back: Normal range of motion and neck supple.  Skin:    General: Skin is warm and dry.     Capillary Refill: Capillary refill takes less than 2 seconds.     Coloration: Skin is not jaundiced or pale.     Findings: No bruising, erythema, lesion or rash.  Neurological:     General: No focal deficit present.     Mental Status: He is alert and oriented to person, place, and time. Mental status is at baseline.  Psychiatric:        Mood and Affect: Mood normal.        Behavior: Behavior normal.        Thought Content: Thought content normal.        Judgment: Judgment normal.     Results  for orders placed or performed in visit on 09/03/22  Bayer DCA Hb A1c Waived  Result Value Ref Range   HB A1C (BAYER DCA - WAIVED) 6.5 (H) 4.8 - 5.6 %  Microalbumin, Urine Waived  Result Value Ref Range   Microalb, Ur Waived 80 (H) 0 - 19 mg/L   Creatinine, Urine Waived 50 10 - 300 mg/dL   Microalb/Creat Ratio 30-300 (H) <30 mg/g      Assessment & Plan:   Problem List Items Addressed This Visit       Cardiovascular and Mediastinum   Aortic atherosclerosis (HCC) - Primary    Will keep BP and cholesterol under good control. Continue to monitor. Call with any concerns.       Relevant Medications   amLODipine (NORVASC) 5 MG tablet   atorvastatin (LIPITOR) 10 MG tablet   hydrALAZINE (APRESOLINE) 100 MG tablet   metoprolol succinate (TOPROL-XL) 100 MG 24 hr tablet   metoprolol tartrate (LOPRESSOR) 50 MG tablet   Other Relevant Orders   CBC with Differential/Platelet   Comprehensive metabolic panel     Respiratory   Pulmonary emphysema (HCC)    Under good control on current regimen. Continue current regimen. Continue to monitor. Call with any concerns. Refills given. Labs drawn today.        Relevant Medications   albuterol (PROVENTIL) (2.5 MG/3ML) 0.083% nebulizer solution   BREZTRI AEROSPHERE 160-9-4.8 MCG/ACT AERO   montelukast (SINGULAIR) 10 MG tablet   STIOLTO RESPIMAT 2.5-2.5 MCG/ACT AERS   Other Relevant Orders   CBC with Differential/Platelet   Comprehensive metabolic panel     Endocrine   Type 2 diabetes mellitus with stage 1 chronic kidney disease, without long-term current use of insulin (HCC)    Doing well with A1c of 6.5. Continue current regimen. Continue to monitor. Call with any concerns.       Relevant Medications   atorvastatin (LIPITOR) 10 MG tablet   empagliflozin (JARDIANCE) 25 MG TABS tablet   metFORMIN (GLUCOPHAGE) 500 MG tablet   Semaglutide (RYBELSUS) 14 MG TABS   Other Relevant Orders   Bayer DCA Hb A1c Waived (Completed)   CBC with  Differential/Platelet   Comprehensive metabolic panel   Microalbumin, Urine Waived (Completed)   Hyperlipidemia associated with type 2 diabetes mellitus (HCC)    Under good control on current regimen. Continue current regimen. Continue to monitor. Call with any concerns. Refills given. Labs drawn today.        Relevant Medications   amLODipine (NORVASC) 5 MG tablet   atorvastatin (LIPITOR) 10 MG tablet   empagliflozin (JARDIANCE) 25 MG TABS tablet   hydrALAZINE (APRESOLINE) 100  MG tablet   metFORMIN (GLUCOPHAGE) 500 MG tablet   metoprolol succinate (TOPROL-XL) 100 MG 24 hr tablet   metoprolol tartrate (LOPRESSOR) 50 MG tablet   Semaglutide (RYBELSUS) 14 MG TABS   Other Relevant Orders   CBC with Differential/Platelet   Lipid Panel w/o Chol/HDL Ratio   Comprehensive metabolic panel     Genitourinary   Benign hypertensive renal disease    Under good control on current regimen. Continue current regimen. Continue to monitor. Call with any concerns. Refills given. Labs drawn today.       Relevant Orders   CBC with Differential/Platelet   Comprehensive metabolic panel   Microalbumin, Urine Waived (Completed)     Other   Uncomplicated opioid dependence (HCC) (Chronic)    Follows with pain management. Stable. Continue to monitor.         Follow up plan: Return in about 3 months (around 12/04/2022) for physical.

## 2022-09-03 NOTE — Assessment & Plan Note (Signed)
Under good control on current regimen. Continue current regimen. Continue to monitor. Call with any concerns. Refills given. Labs drawn today.   

## 2022-09-03 NOTE — Assessment & Plan Note (Signed)
Will keep BP and cholesterol under good control. Continue to monitor. Call with any concerns.  

## 2022-09-03 NOTE — Assessment & Plan Note (Signed)
Follows with pain management. Stable. Continue to monitor.

## 2022-09-04 LAB — COMPREHENSIVE METABOLIC PANEL
ALT: 28 IU/L (ref 0–44)
AST: 27 IU/L (ref 0–40)
Albumin: 4.6 g/dL (ref 3.8–4.9)
Alkaline Phosphatase: 161 IU/L — ABNORMAL HIGH (ref 44–121)
BUN/Creatinine Ratio: 7 — ABNORMAL LOW (ref 10–24)
BUN: 6 mg/dL — ABNORMAL LOW (ref 8–27)
Bilirubin Total: 0.4 mg/dL (ref 0.0–1.2)
CO2: 25 mmol/L (ref 20–29)
Calcium: 9.1 mg/dL (ref 8.6–10.2)
Chloride: 102 mmol/L (ref 96–106)
Creatinine, Ser: 0.9 mg/dL (ref 0.76–1.27)
Globulin, Total: 2.3 g/dL (ref 1.5–4.5)
Glucose: 131 mg/dL — ABNORMAL HIGH (ref 70–99)
Potassium: 3.6 mmol/L (ref 3.5–5.2)
Sodium: 141 mmol/L (ref 134–144)
Total Protein: 6.9 g/dL (ref 6.0–8.5)
eGFR: 98 mL/min/{1.73_m2} (ref >59)

## 2022-09-04 LAB — CBC WITH DIFFERENTIAL/PLATELET
Basophils Absolute: 0.1 10*3/uL (ref 0.0–0.2)
Basos: 1 %
EOS (ABSOLUTE): 0.1 10*3/uL (ref 0.0–0.4)
Eos: 2 %
Hematocrit: 42.1 % (ref 37.5–51.0)
Hemoglobin: 13.7 g/dL (ref 13.0–17.7)
Immature Grans (Abs): 0 10*3/uL (ref 0.0–0.1)
Immature Granulocytes: 0 %
Lymphocytes Absolute: 1.3 10*3/uL (ref 0.7–3.1)
Lymphs: 25 %
MCH: 29.1 pg (ref 26.6–33.0)
MCHC: 32.5 g/dL (ref 31.5–35.7)
MCV: 90 fL (ref 79–97)
Monocytes Absolute: 0.3 10*3/uL (ref 0.1–0.9)
Monocytes: 6 %
Neutrophils Absolute: 3.4 10*3/uL (ref 1.4–7.0)
Neutrophils: 66 %
Platelets: 145 10*3/uL — ABNORMAL LOW (ref 150–450)
RBC: 4.7 x10E6/uL (ref 4.14–5.80)
RDW: 13.3 % (ref 11.6–15.4)
WBC: 5.1 10*3/uL (ref 3.4–10.8)

## 2022-09-04 LAB — LIPID PANEL W/O CHOL/HDL RATIO
Cholesterol, Total: 120 mg/dL (ref 100–199)
HDL: 38 mg/dL — ABNORMAL LOW (ref >39)
LDL Chol Calc (NIH): 59 mg/dL (ref 0–99)
Triglycerides: 129 mg/dL (ref 0–149)
VLDL Cholesterol Cal: 23 mg/dL (ref 5–40)

## 2022-10-25 ENCOUNTER — Other Ambulatory Visit: Payer: Self-pay | Admitting: Family Medicine

## 2022-10-25 NOTE — Telephone Encounter (Signed)
Request was refilled 09/03/22 for 90 and 1 refill.  Requested Prescriptions  Pending Prescriptions Disp Refills   atorvastatin (LIPITOR) 10 MG tablet [Pharmacy Med Name: ATORVASTATIN 10MG  TABLETS] 90 tablet 1    Sig: TAKE 1 TABLET(10 MG) BY MOUTH DAILY AT 6 PM     Cardiovascular:  Antilipid - Statins Failed - 10/25/2022  8:39 AM      Failed - Lipid Panel in normal range within the last 12 months    Cholesterol, Total  Date Value Ref Range Status  09/03/2022 120 100 - 199 mg/dL Final   LDL Chol Calc (NIH)  Date Value Ref Range Status  09/03/2022 59 0 - 99 mg/dL Final   HDL  Date Value Ref Range Status  09/03/2022 38 (L) >39 mg/dL Final   Triglycerides  Date Value Ref Range Status  09/03/2022 129 0 - 149 mg/dL Final         Passed - Patient is not pregnant      Passed - Valid encounter within last 12 months    Recent Outpatient Visits           1 month ago Aortic atherosclerosis (HCC)   [facility] [place], [name], DO   4 months ago Type 2 diabetes mellitus with stage 1 chronic kidney disease, without long-term current use of insulin (HCC)   [facility] [place], [name], DO   7 months ago Type 2 diabetes mellitus with stage 1 chronic kidney disease, without long-term current use of insulin (HCC)   [facility] [place], [name], DO   8 months ago Chronic pain syndrome   [facility] Gamaliel, [name], DO   10 months ago Type 2 diabetes mellitus with stage 1 chronic kidney disease, without long-term current use of insulin (HCC)   [facility] [place], Oralia Rud, DO       Future Appointments             In 1 month Johnson, Oralia Rud, DO [name]

## 2022-10-31 NOTE — Progress Notes (Unsigned)
PROVIDER NOTE: Information contained herein reflects review and annotations entered in association with encounter. Interpretation of such information and data should be left to medically-trained personnel. Information provided to patient can be located elsewhere in the medical record under "Patient Instructions". Document created using STT-dictation technology, any transcriptional errors that may result from process are unintentional.    Patient: Kristopher [name]  Service Category: E/M  Provider: Oswaldo Done, MD  DOB: Apr 12, 1960  DOS: 11/06/2022  Referring Provider: Dorcas Carrow, DO  MRN: [id number]  Specialty: Interventional Pain Management  PCP: Dorcas Carrow, DO  Type: Established Patient  Setting: Ambulatory outpatient    Location: Office  Delivery: Face-to-face     HPI  [name], a 62 y.o. year old male, is here today because of his Chronic pain syndrome [G89.4]. [name] primary complain today is No chief complaint on file.  Pertinent problems: [name] has Chronic low back pain (1ry area of Pain) (Bilateral) (L>R); Lumbar spondylosis; Chronic lumbar radicular pain (S1 dermatomal) (Left); 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); Chronic neck pain (posterior midline) (Bilateral) (L>R); Cervical spondylosis; Chronic cervical radicular pain (Bilateral) (C5/C6 dermatome) (L>R); Cervical spinal stenosis (C4-5); Cervical foraminal stenosis (Bilateral C5-6); Retrolisthesis of L5-S1; Cervical disc herniation (C4-5 and C5-6); Lumbar disc herniation (L5-S1); Chronic sacroiliac joint pain (Bilateral) (L>R); Chronic hip pain (Left); Lumbar facet syndrome (Bilateral) (L>R); Chronic lower extremity pain (2ry area of Pain) (Left); Greater occipital neuralgia (Right); Chronic pain syndrome; Migraine; Musculoskeletal pain, chronic; Left-sided weakness; Hip arthritis; S/P hip replacement; Squamous cell carcinoma of left lung (HCC); and Chronic upper extremity  pain (3ry area of Pain) (Bilateral) (L>R) on their pertinent problem list. Pain Assessment: Severity of   is reported as a  /10. Location:    / . Onset:  . Quality:  . Timing:  . Modifying factor(s):  [name] Vitals:  vitals were not taken for this visit.  BMI: Estimated body mass index is 32.64 kg/m as calculated from the following:   Height as of 09/03/22: 5\' 9"  (1.753 m).   Weight as of 09/03/22: 221 lb (100.2 kg). Last encounter: 08/07/2022. Last procedure: Visit date not found.  Reason for encounter: medication management. ***  RTCB: 02/09/2023   Pharmacotherapy Assessment  Analgesic: Morphine IR 15 mg, 1 tablet p.o. every 6 hours (60 mg/day of morphine) (60 MME) + Compounded Specialty Analgesic Cream (w/ Ketamine) MME/day: 60 mg/day.   Monitoring: [place] PMP: PDMP reviewed during this encounter.       Pharmacotherapy: No side-effects or adverse reactions reported. Compliance: No problems identified. Effectiveness: Clinically acceptable.  No notes on file  No results found for: "CBDTHCR" No results found for: "D8THCCBX" No results found for: "D9THCCBX"  UDS:  Summary  Date Value Ref Range Status  05/08/2022 Note  Final    Comment:    ==================================================================== [employer] Select 13 (MW) ==================================================================== Test                             Result       Flag       Units  Drug Present not Declared for Prescription Verification   Alpha-hydroxymidazolam         347          UNEXPECTED ng/mg creat    Alpha-hydroxymidazolam is an expected metabolite of midazolam.    Source of midazolam is a scheduled prescription medication.    Morphine  8921         UNEXPECTED ng/mg creat    A moderate to large amount of morphine is present; the metabolites    normorphine and hydromorphone are not present. This is an atypical    result. Although patients with unusual metabolic profiles exist,     they are rare. Review of previous drug screen results or collection    of a urine sample several hours after a WITNESSED dose of the drug    may help to clarify the subject's ability to produce metabolite.     Potential sources of large amounts of morphine in the absence of    codeine include administration of morphine or use of heroin.  ==================================================================== Test                      Result    Flag   Units      Ref Range   Creatinine              73               mg/dL      >=40 ==================================================================== Declared Medications:  The flagging and interpretation on this report are based on the  following declared medications.  Unexpected results may arise from  inaccuracies in the declared medications. ==================================================================== For clinical consultation, please call [phone]. ====================================================================       ROS  Constitutional: Denies any fever or chills Gastrointestinal: No reported hemesis, hematochezia, vomiting, or acute GI distress Musculoskeletal: Denies any acute onset joint swelling, redness, loss of ROM, or weakness Neurological: No reported episodes of acute onset apraxia, aphasia, dysarthria, agnosia, amnesia, paralysis, loss of coordination, or loss of consciousness  Medication Review  Accu-Chek FastClix Lancets, Budeson-Glycopyrrol-Formoterol, Potassium Chloride ER, SUMAtriptan, Semaglutide, Tiotropium Bromide-Olodaterol, albuterol, amLODipine, atorvastatin, diphenhydrAMINE, empagliflozin, esomeprazole, glucose blood, hydrALAZINE, lidocaine, metFORMIN, metoprolol succinate, metoprolol tartrate, montelukast, morphine, multivitamin with minerals, naloxone, and nortriptyline  History Review  Allergy: [name] is allergic to acetaminophen, aspirin, epinephrine, novocain [procaine], penicillins,  strawberry extract, and shellfish allergy. Drug: [name]  reports no history of drug use. Alcohol:  reports no history of alcohol use. Tobacco:  reports that he quit smoking about 3 years ago. His smoking use included cigarettes. He started smoking about 38 years ago. He has a 8.8 pack-year smoking history. He has never used smokeless tobacco. Social: [name]  reports that he quit smoking about 3 years ago. His smoking use included cigarettes. He started smoking about 38 years ago. He has a 8.8 pack-year smoking history. He has never used smokeless tobacco. He reports that he does not drink alcohol and does not use drugs. Medical:  has a past medical history of Allergy, Arthritis, Benign hypertensive kidney disease, Chronic back pain, Diabetes mellitus, type 2 (HCC), Dyspnea, GERD (gastroesophageal reflux disease), Hypertension, Malignant neoplasm of lung (HCC), and Migraines. Surgical: [name]  has a past surgical history that includes Back surgery; Appendectomy; Leg Surgery; Knee surgery (Left); Foot surgery (Left); Colonoscopy with propofol (N/A, 02/19/2016); polypectomy (N/A, 02/19/2016); Total hip arthroplasty (Left, 12/02/2017); Colonoscopy with propofol (N/A, 01/19/2018); polypectomy (01/19/2018); DG OPERATIVE LEFT HIP ([facility] HX); Joint replacement (Left, 12/2017); Video bronchoscopy (Left, 01/14/2019); Thoracotomy (Left, 01/14/2019); Flexible bronchoscopy (Bilateral, 01/20/2019); Flexible bronchoscopy (Bilateral, 01/22/2019); Electormagnetic navigation bronchoscopy (Left, 11/18/2018); Lung cancer surgery; Video bronchoscopy with endobronchial ultrasound (N/A, 10/15/2019); Video bronchoscopy with endobronchial navigation (N/A, 10/15/2019); Portacath placement (N/A, 11/11/2019); Colonoscopy with propofol (N/A, 03/16/2021); and Colonoscopy with propofol (N/A, 04/06/2021). Family:  family history includes Cancer in his father; Diabetes in his sister; Thrombosis in his sister.  Laboratory Chemistry Profile    Renal Lab Results  Component Value Date   BUN 6 (L) 09/03/2022   CREATININE 0.90 09/03/2022   BCR 7 (L) 09/03/2022   GFRAA >60 12/01/2019   GFRNONAA >60 07/05/2021    Hepatic Lab Results  Component Value Date   AST 27 09/03/2022   ALT 28 09/03/2022   ALBUMIN 4.6 09/03/2022   ALKPHOS 161 (H) 09/03/2022   LIPASE 50 02/17/2019    Electrolytes Lab Results  Component Value Date   NA 141 09/03/2022   K 3.6 09/03/2022   CL 102 09/03/2022   CALCIUM 9.1 09/03/2022   MG 2.4 01/25/2019   PHOS 3.5 01/23/2019    Bone No results found for: "VD25OH", "VD125OH2TOT", "ZO1096EA5", "WU9811BJ4", "25OHVITD1", "25OHVITD2", "25OHVITD3", "TESTOFREE", "TESTOSTERONE"  Inflammation (CRP: Acute Phase) (ESR: Chronic Phase) Lab Results  Component Value Date   CRP 0.7 04/03/2015   ESRSEDRATE 11 10/21/2017   LATICACIDVEN 1.7 01/16/2019         Note: Above Lab results reviewed.  Recent Imaging Review  CT Chest W Contrast CLINICAL DATA:  Follow-up squamous cell carcinoma the left lung. History of left lower lobectomy, chemotherapy and radiation therapy. * Tracking Code: BO *  EXAM: CT CHEST WITH CONTRAST  TECHNIQUE: Multidetector CT imaging of the chest was performed during intravenous contrast administration.  RADIATION DOSE REDUCTION: This exam was performed according to the departmental dose-optimization program which includes automated exposure control, adjustment of the mA and/or kV according to patient size and/or use of iterative reconstruction technique.  CONTRAST:  75mL OMNIPAQUE IOHEXOL 300 MG/ML  SOLN  COMPARISON:  Prior CTs 11/06/2021, 07/03/2021 and 12/14/2020.  FINDINGS: Cardiovascular: Mild atherosclerosis of the aorta and great vessels. The heart size is normal. There is no pericardial effusion.  Mediastinum/Nodes: There are no enlarged mediastinal, hilar or axillary lymph nodes. The thyroid gland, trachea and esophagus demonstrate no significant  findings.  Lungs/Pleura: No pleural effusion or pneumothorax. Stable postsurgical changes from left lower lobectomy. There are stable radiation changes in the left perihilar region without evidence of recurrent mass lesion. Unchanged 4 mm subpleural nodule posteriorly in the right upper lobe along the superior aspect of the major fissure on image 33/3. No new, enlarging or suspicious pulmonary nodules.  Upper abdomen: No acute findings are seen in the visualized upper abdomen. Mild contour irregularity of the liver suggesting cirrhosis.  Musculoskeletal/Chest wall: There is no chest wall mass or suspicious osseous finding. Mild spondylosis.  IMPRESSION: 1. Stable chest CT status post left lower lobectomy and radiation therapy. 2. No evidence of local recurrence or metastatic disease. 3. Mild contour irregularity of the liver suggesting cirrhosis. 4.  Aortic Atherosclerosis (ICD10-I70.0).  Electronically Signed   By: [name] Bullocks M.D.   On: 05/09/2022 09:30 Note: Reviewed        Physical Exam  General appearance: Well nourished, well developed, and well hydrated. In no apparent acute distress Mental status: Alert, oriented x 3 (person, place, & time)       Respiratory: No evidence of acute respiratory distress Eyes: PERLA Vitals: There were no vitals taken for this visit. BMI: Estimated body mass index is 32.64 kg/m as calculated from the following:   Height as of 09/03/22: 5\' 9"  (1.753 m).   Weight as of 09/03/22: 221 lb (100.2 kg). Ideal: Patient weight not recorded  Assessment   Diagnosis Status  1. Chronic pain syndrome  2. Chronic low back pain (1ry area of Pain) (Bilateral) (L>R)   3. Chronic lower extremity pain (2ry area of Pain) (Left)   4. Chronic upper extremity pain (3ry area of Pain) (Bilateral) (L>R)   5. 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)   6. Squamous cell carcinoma of left lung (HCC)   7. Pharmacologic therapy   8.  Chronic use of opiate for therapeutic purpose   9. Encounter for medication management   10. Encounter for chronic pain management    Controlled Controlled Controlled   Updated Problems: No problems updated.  Plan of Care  Problem-specific:  No problem-specific Assessment & Plan notes found for this encounter.  [name] has a current medication list which includes the following long-term medication(s): albuterol, albuterol, amlodipine, atorvastatin, esomeprazole, hydralazine, metformin, metoprolol succinate, metoprolol tartrate, montelukast, morphine, nortriptyline, potassium chloride er, and sumatriptan.  Pharmacotherapy (Medications Ordered): No orders of the defined types were placed in this encounter.  Orders:  No orders of the defined types were placed in this encounter.  Follow-up plan:   No follow-ups on file.      Interventional Therapies  Risk Factors  Complex Considerations:   High Risk for SUD.    Planned  Pending:   None at this time. Claims to have anaphylactic allergy to Local Anesthetics. He was sent for testing and shown/proven not to be allergic to Lidocaine.    Under consideration:   None   Completed:   We have now called his compounding pharmacy twice, one in 2022 and on 08/13/2021 and they have confirmed that they do on her day 1 year refill prescription for his nonformulary cream.  The last prescription was sent to them on 07/31/2021 and should be good until 07/31/2022.  They have confirmed on 08/13/2021 that they are in position of this prescription. On the patient's initial evaluation he claims to have an anaphylactic allergy to lidocaine.  The patient was sent to [facility] dermatology where he was skin tested and found to have absolutely no allergies to lidocaine.  Neither ropivacaine (Naropin) nor bupivacaine (Sensorcaine) were tested.   Therapeutic  Palliative (PRN) options:   None.   Pharmacotherapy  Compounded medication was transferred  on 02/06/2022.  At that time, he still had enough refills to last until 07/31/2022. Compounding pharmacy honors 1 year scripts.         Recent Visits Date Type Provider Dept  08/07/22 Office Visit Delano Metz, MD [facility]  Showing recent visits within past 90 days and meeting all other requirements Future Appointments Date Type Provider Dept  11/06/22 Appointment Delano Metz, MD [facility]  Showing future appointments within next 90 days and meeting all other requirements  I discussed the assessment and treatment plan with the patient. The patient was provided an opportunity to ask questions and all were answered. The patient agreed with the plan and demonstrated an understanding of the instructions.  Patient advised to call back or seek an in-person evaluation if the symptoms or condition worsens.  Duration of encounter: *** minutes.  Total time on encounter, as per AMA guidelines included both the face-to-face and non-face-to-face time personally spent by the physician and/or other qualified health care professional(s) on the day of the encounter (includes time in activities that require the physician or other qualified health care professional and does not include time in activities normally performed by clinical staff). Physician's time may include the following activities when performed: Preparing to see the  patient (e.g., pre-charting review of records, searching for previously ordered imaging, lab work, and nerve conduction tests) Review of prior analgesic pharmacotherapies. Reviewing PMP Interpreting ordered tests (e.g., lab work, imaging, nerve conduction tests) Performing post-procedure evaluations, including interpretation of diagnostic procedures Obtaining and/or reviewing separately obtained history Performing a medically appropriate examination and/or evaluation Counseling and educating the patient/family/caregiver Ordering medications,  tests, or procedures Referring and communicating with other health care professionals (when not separately reported) Documenting clinical information in the electronic or other health record Independently interpreting results (not separately reported) and communicating results to the patient/ family/caregiver Care coordination (not separately reported)  Note by: Oswaldo Done, MD Date: 11/06/2022; Time: 1:27 PM

## 2022-10-31 NOTE — Patient Instructions (Signed)
 ____________________________________________________________________________________________  Opioid Pain Medication Update  To: All patients taking opioid pain medications. (I.e.: hydrocodone, hydromorphone, oxycodone, oxymorphone, morphine, codeine, methadone, tapentadol, tramadol, buprenorphine, fentanyl, etc.)  Re: Updated review of side effects and adverse reactions of opioid analgesics, as well as new information about long term effects of this class of medications.  Direct risks of long-term opioid therapy are not limited to opioid addiction and overdose. Potential medical risks include serious fractures, breathing problems during sleep, hyperalgesia, immunosuppression, chronic constipation, bowel obstruction, myocardial infarction, and tooth decay secondary to xerostomia.  Unpredictable adverse effects that can occur even if you take your medication correctly: Cognitive impairment, respiratory depression, and death. Most people think that if they take their medication "correctly", and "as instructed", that they will be safe. Nothing could be farther from the truth. In reality, a significant amount of recorded deaths associated with the use of opioids has occurred in individuals that had taken the medication for a long time, and were taking their medication correctly. The following are examples of how this can happen: Patient taking his/her medication for a long time, as instructed, without any side effects, is given a certain antibiotic or another unrelated medication, which in turn triggers a "Drug-to-drug interaction" leading to disorientation, cognitive impairment, impaired reflexes, respiratory depression or an untoward event leading to serious bodily harm or injury, including death.  Patient taking his/her medication for a long time, as instructed, without any side effects, develops an acute impairment of liver and/or kidney function. This will lead to a rapid inability of the body to  breakdown and eliminate their pain medication, which will result in effects similar to an "overdose", but with the same medicine and dose that they had always taken. This again may lead to disorientation, cognitive impairment, impaired reflexes, respiratory depression or an untoward event leading to serious bodily harm or injury, including death.  A similar problem will occur with patients as they grow older and their liver and kidney function begins to decrease as part of the aging process.  Background information: Historically, the original case for using long-term opioid therapy to treat chronic noncancer pain was based on safety assumptions that subsequent experience has called into question. In 1996, the American Pain Society and the American Academy of Pain Medicine issued a consensus statement supporting long-term opioid therapy. This statement acknowledged the dangers of opioid prescribing but concluded that the risk for addiction was low; respiratory depression induced by opioids was short-lived, occurred mainly in opioid-naive patients, and was antagonized by pain; tolerance was not a common problem; and efforts to control diversion should not constrain opioid prescribing. This has now proven to be wrong. Experience regarding the risks for opioid addiction, misuse, and overdose in community practice has failed to support these assumptions.  According to the Centers for Disease Control and Prevention, fatal overdoses involving opioid analgesics have increased sharply over the past decade. Currently, more than 96,700 people die from drug overdoses every year. Opioids are a factor in 7 out of every 10 overdose deaths. Deaths from drug overdose have surpassed motor vehicle accidents as the leading cause of death for individuals between the ages of 80 and 61.  Clinical data suggest that neuroendocrine dysfunction may be very common in both men and women, potentially causing hypogonadism, erectile  dysfunction, infertility, decreased libido, osteoporosis, and depression. Recent studies linked higher opioid dose to increased opioid-related mortality. Controlled observational studies reported that long-term opioid therapy may be associated with increased risk for cardiovascular events. Subsequent meta-analysis concluded  that the safety of long-term opioid therapy in elderly patients has not been proven.   Side Effects and adverse reactions: Common side effects: Drowsiness (sedation). Dizziness. Nausea and vomiting. Constipation. Physical dependence -- Dependence often manifests with withdrawal symptoms when opioids are discontinued or decreased. Tolerance -- As you take repeated doses of opioids, you require increased medication to experience the same effect of pain relief. Respiratory depression -- This can occur in healthy people, especially with higher doses. However, people with COPD, asthma or other lung conditions may be even more susceptible to fatal respiratory impairment.  Uncommon side effects: An increased sensitivity to feeling pain and extreme response to pain (hyperalgesia). Chronic use of opioids can lead to this. Delayed gastric emptying (the process by which the contents of your stomach are moved into your small intestine). Muscle rigidity. Immune system and hormonal dysfunction. Quick, involuntary muscle jerks (myoclonus). Arrhythmia. Itchy skin (pruritus). Dry mouth (xerostomia).  Long-term side effects: Chronic constipation. Sleep-disordered breathing (SDB). Increased risk of bone fractures. Hypothalamic-pituitary-adrenal dysregulation. Increased risk of overdose.  RISKS: Respiratory depression and death: Opioids increase the risk of respiratory depression and death.  Drug-to-drug interactions: Opioids are relatively contraindicated in combination with benzodiazepines, sleep inducers, and other central nervous system depressants. Other classes of medications  (i.e.: certain antibiotics and even over-the-counter medications) may also trigger or induce respiratory depression in some patients.  Medical conditions: Patients with pre-existing respiratory problems are at higher risk of respiratory failure and/or depression when in combination with opioid analgesics. Opioids are relatively contraindicated in some medical conditions such as central sleep apnea.   Fractures and Falls:  Opioids increase the risk and incidence of falls. This is of particular importance in elderly patients.  Endocrine System:  Long-term administration is associated with endocrine abnormalities (endocrinopathies). (Also known as Opioid-induced Endocrinopathy) Influences on both the hypothalamic-pituitary-adrenal axis?and the hypothalamic-pituitary-gonadal axis have been demonstrated with consequent hypogonadism and adrenal insufficiency in both sexes. Hypogonadism and decreased levels of dehydroepiandrosterone sulfate have been reported in men and women. Endocrine effects include: Amenorrhoea in women (abnormal absence of menstruation) Reduced libido in both sexes Decreased sexual function Erectile dysfunction in men Hypogonadisms (decreased testicular function with shrinkage of testicles) Infertility Depression and fatigue Loss of muscle mass Anxiety Depression Immune suppression Hyperalgesia Weight gain Anemia Osteoporosis Patients (particularly women of childbearing age) should avoid opioids. There is insufficient evidence to recommend routine monitoring of asymptomatic patients taking opioids in the long-term for hormonal deficiencies.  Immune System: Human studies have demonstrated that opioids have an immunomodulating effect. These effects are mediated via opioid receptors both on immune effector cells and in the central nervous system. Opioids have been demonstrated to have adverse effects on antimicrobial response and anti-tumour surveillance. Buprenorphine has  been demonstrated to have no impact on immune function.  Opioid Induced Hyperalgesia: Human studies have demonstrated that prolonged use of opioids can lead to a state of abnormal pain sensitivity, sometimes called opioid induced hyperalgesia (OIH). Opioid induced hyperalgesia is not usually seen in the absence of tolerance to opioid analgesia. Clinically, hyperalgesia may be diagnosed if the patient on long-term opioid therapy presents with increased pain. This might be qualitatively and anatomically distinct from pain related to disease progression or to breakthrough pain resulting from development of opioid tolerance. Pain associated with hyperalgesia tends to be more diffuse than the pre-existing pain and less defined in quality. Management of opioid induced hyperalgesia requires opioid dose reduction.  Cancer: Chronic opioid therapy has been associated with an increased risk of cancer  among noncancer patients with chronic pain. This association was more evident in chronic strong opioid users. Chronic opioid consumption causes significant pathological changes in the small intestine and colon. Epidemiological studies have found that there is a link between opium dependence and initiation of gastrointestinal cancers. Cancer is the second leading cause of death after cardiovascular disease. Chronic use of opioids can cause multiple conditions such as GERD, immunosuppression and renal damage as well as carcinogenic effects, which are associated with the incidence of cancers.   Mortality: Long-term opioid use has been associated with increased mortality among patients with chronic non-cancer pain (CNCP).  Prescription of long-acting opioids for chronic noncancer pain was associated with a significantly increased risk of all-cause mortality, including deaths from causes other than overdose.  Reference: Von Korff M, Kolodny A, Deyo RA, Chou R. Long-term opioid therapy reconsidered. Ann Intern Med. 2011  Sep 6;155(5):325-8. doi: 10.7326/[id number]. PMID: 64403474; PMCID: [id number]. Randon Goldsmith, Hayward RA, Dunn KM, [place] KP. Risk of adverse events in patients prescribed long-term opioids: A cohort study in the [place] [facility] Research Datalink. Eur J Pain. 2019 May;23(5):908-922. doi: 10.1002/ejp.1357. Epub 2019 Jan 31. PMID: 43329518. Colameco S, Coren JS, Ciervo CA. Continuous opioid treatment for chronic noncancer pain: a time for moderation in prescribing. Postgrad Med. 2009 Jul;121(4):61-6. doi: 10.3810/pgm.2009.07.2032. PMID: 84166063. William Hamburger RN, [place] SD, Blazina I, Cristopher Peru, Bougatsos C, Deyo RA. The effectiveness and risks of long-term opioid therapy for chronic pain: a systematic review for a [employer] of [employer] Pathways to [employer]. Ann Intern Med. 2015 Feb 17;162(4):276-86. doi: 10.7326/M14-2559. PMID: 01601093. Caryl Bis [facility], Makuc DM. NCHS Data Brief No. 22. [place]: Centers for Disease Control and Prevention; 2009. Sep, Increase in Fatal Poisonings Involving Opioid Analgesics in the [place], 1999-2006. Song IA, Choi HR, Oh TK. Long-term opioid use and mortality in patients with chronic non-cancer pain: Ten-year follow-up study in [place] from 2010 through 2019. EClinicalMedicine. 2022 Jul 18;51:101558. doi: 10.1016/j.eclinm.2022.235573. PMID: 22025427; PMCID: [id number]. Huser, W., Schubert, T., Vogelmann, T. et al. All-cause mortality in patients with long-term opioid therapy compared with non-opioid analgesics for chronic non-cancer pain: a database study. BMC Med 18, 162 (2020). http://lester.info/ Rashidian H, Karie Kirks, Malekzadeh R, Haghdoost AA. An Ecological Study of the Association between Opiate Use and Incidence of Cancers. Addict Health. 2016 Fall;8(4):252-260. PMID: 51761607; PMCID: [id number].  Our Goal: Our goal is to control your  pain with means other than the use of opioid pain medications.  Our Recommendation: Talk to your physician about coming off of these medications. We can assist you with the tapering down and stopping these medicines. Based on the new information, even if you cannot completely stop the medication, a decrease in the dose may be associated with a lesser risk. Ask for other means of controlling the pain. Decrease or eliminate those factors that significantly contribute to your pain such as smoking, obesity, and a diet heavily tilted towards "inflammatory" nutrients.  Last Updated: 09/09/2022   ____________________________________________________________________________________________     ____________________________________________________________________________________________  National Pain Medication Shortage  The [place] is experiencing worsening drug shortages. These have had a negative widespread effect on patient care and treatment. Not expected to improve any time soon. Predicted to last past 2029.   Drug shortage list (generic names) Oxycodone IR Oxycodone/APAP Oxymorphone IR Hydromorphone Hydrocodone/APAP Morphine  Where is the problem?  Manufacturing and supply level.  Will this shortage affect you?  Only if you  take any of the above pain medications.  How? You may be unable to fill your prescription.  Your pharmacist may offer a "partial fill" of your prescription. (Warning: Do not accept partial fills.) Prescriptions partially filled cannot be transferred to another pharmacy. Read our Medication Rules and Regulation. Depending on how much medicine you are dependent on, you may experience withdrawals when unable to get the medication.  Recommendations: Consider ending your dependence on opioid pain medications. Ask your pain specialist to assist you with the process. Consider switching to a medication currently not in shortage, such as Buprenorphine. Talk to your pain  specialist about this option. Consider decreasing your pain medication requirements by managing tolerance thru "Drug Holidays". This may help minimize withdrawals, should you run out of medicine. Control your pain thru the use of non-pharmacological interventional therapies.   Your prescriber: Prescribers cannot be blamed for shortages. Medication manufacturing and supply issues cannot be fixed by the prescriber.   NOTE: The prescriber is not responsible for supplying the medication, or solving supply issues. Work with your pharmacist to solve it. The patient is responsible for the decision to take or continue taking the medication and for identifying and securing a legal supply source. By law, supplying the medication is the job and responsibility of the pharmacy. The prescriber is responsible for the evaluation, monitoring, and prescribing of these medications.   Prescribers will NOT: Re-issue prescriptions that have been partially filled. Re-issue prescriptions already sent to a pharmacy.  Re-send prescriptions to a different pharmacy because yours did not have your medication. Ask pharmacist to order more medicine or transfer the prescription to another pharmacy. (Read below.)  New 2023 regulation: "November 02, 2021 Revised Regulation Allows DEA-Registered Pharmacies to Transfer Electronic Prescriptions at a Patient's Request DEA Headquarters Division - Public Information Office Patients now have the ability to request their electronic prescription be transferred to another pharmacy without having to go back to their practitioner to initiate the request. This revised regulation went into effect on Monday, October 29, 2021.     At a patient's request, a DEA-registered retail pharmacy can now transfer an electronic prescription for a controlled substance (schedules II-V) to another DEA-registered retail pharmacy. Prior to this change, patients would have to go through their practitioner to  cancel their prescription and have it re-issued to a different pharmacy. The process was taxing and time consuming for both patients and practitioners.    The Drug Enforcement Administration [facility]) published its intent to revise the process for transferring electronic prescriptions on January 21, 2020.  The final rule was published in the federal register on September 27, 2021 and went into effect 30 days later.  Under the final rule, a prescription can only be transferred once between pharmacies, and only if allowed under existing state or other applicable law. The prescription must remain in its electronic form; may not be altered in any way; and the transfer must be communicated directly between two licensed pharmacists. It's important to note, any authorized refills transfer with the original prescription, which means the entire prescription will be filled at the same pharmacy".  Reference: HugeHand.is [facility] website announcement)  [email or web address] [employer] of Justice)   [employer] / Vol. 88, No. 143 / Thursday, September 27, 2021 / Rules and Regulations DEPARTMENT OF JUSTICE  Drug Enforcement Administration  21 CFR Part 1306  [Docket No. [id number]]  RIN [id number] Transfer of Electronic Prescriptions for Schedules II-V Controlled Substances Between Pharmacies for Initial Filling  ____________________________________________________________________________________________  ____________________________________________________________________________________________  Transfer of Pain Medication between Pharmacies  Re: 2023 DEA Clarification on existing regulation  Published on DEA Website: November 02, 2021  Title: Revised Regulation Allows DEA-Registered Pharmacies to Electrical engineer Prescriptions at a Patient's  Request DEA Headquarters Division - [employer]  "Patients now have the ability to request their electronic prescription be transferred to another pharmacy without having to go back to their practitioner to initiate the request. This revised regulation went into effect on Monday, October 29, 2021.     At a patient's request, a DEA-registered retail pharmacy can now transfer an electronic prescription for a controlled substance (schedules II-V) to another DEA-registered retail pharmacy. Prior to this change, patients would have to go through their practitioner to cancel their prescription and have it re-issued to a different pharmacy. The process was taxing and time consuming for both patients and practitioners.    The Drug Enforcement Administration [facility]) published its intent to revise the process for transferring electronic prescriptions on January 21, 2020.  The final rule was published in the federal register on September 27, 2021 and went into effect 30 days later.  Under the final rule, a prescription can only be transferred once between pharmacies, and only if allowed under existing state or other applicable law. The prescription must remain in its electronic form; may not be altered in any way; and the transfer must be communicated directly between two licensed pharmacists. It's important to note, any authorized refills transfer with the original prescription, which means the entire prescription will be filled at the same pharmacy."    REFERENCES: 1. DEA website announcement [email or web address]  2. Department of Justice website  [email or web address].pdf  3. DEPARTMENT OF JUSTICE Drug Enforcement Administration 21 CFR Part 1306 [Docket No. [id number]] RIN [id number] "Transfer of Electronic Prescriptions for Schedules II-V Controlled Substances  Between Pharmacies for Initial Filling"  ____________________________________________________________________________________________     _______________________________________________________________________  Medication Rules  Purpose: To inform patients, and their family members, of our medication rules and regulations.  Applies to: All patients receiving prescriptions from our practice (written or electronic).  Pharmacy of record: This is the pharmacy where your electronic prescriptions will be sent. Make sure we have the correct one.  Electronic prescriptions: In compliance with the [facility] Strengthen Opioid Misuse Prevention (STOP) Act of 2017 (Session Conni Elliot [phone]), effective March 04, 2018, all controlled substances must be electronically prescribed. Written prescriptions, faxing, or calling prescriptions to a pharmacy will no longer be done.  Prescription refills: These will be provided only during in-person appointments. No medications will be renewed without a "face-to-face" evaluation with your provider. Applies to all prescriptions.  NOTE: The following applies primarily to controlled substances (Opioid* Pain Medications).   Type of encounter (visit): For patients receiving controlled substances, face-to-face visits are required. (Not an option and not up to the patient.)  Patient's responsibilities: Pain Pills: Bring all pain pills to every appointment (except for procedure appointments). Pill Bottles: Bring pills in original pharmacy bottle. Bring bottle, even if empty. Always bring the bottle of the most recent fill.  Medication refills: You are responsible for knowing and keeping track of what medications you are taking and when is it that you will need a refill. The day before your appointment: write a list of all prescriptions that need to be refilled. The day of the appointment: give the list to the admitting nurse. Prescriptions will be written only  during appointments. No prescriptions will be written on procedure days. If you forget a  medication: it will not be "Called in", "Faxed", or "electronically sent". You will need to get another appointment to get these prescribed. No early refills. Do not call asking to have your prescription filled early. Partial  or short prescriptions: Occasionally your pharmacy may not have enough pills to fill your prescription.  NEVER ACCEPT a partial fill or a prescription that is short of the total amount of pills that you were prescribed.  With controlled substances the law allows 72 hours for the pharmacy to complete the prescription.  If the prescription is not completed within 72 hours, the pharmacist will require a new prescription to be written. This means that you will be short on your medicine and we WILL NOT send another prescription to complete your original prescription.  Instead, request the pharmacy to send a carrier to a nearby branch to get enough medication to provide you with your full prescription. Prescription Accuracy: You are responsible for carefully inspecting your prescriptions before leaving our office. Have the discharge nurse carefully go over each prescription with you, before taking them home. Make sure that your name is accurately spelled, that your address is correct. Check the name and dose of your medication to make sure it is accurate. Check the number of pills, and the written instructions to make sure they are clear and accurate. Make sure that you are given enough medication to last until your next medication refill appointment. Taking Medication: Take medication as prescribed. When it comes to controlled substances, taking less pills or less frequently than prescribed is permitted and encouraged. Never take more pills than instructed. Never take the medication more frequently than prescribed.  Inform other Doctors: Always inform, all of your healthcare providers, of all the  medications you take. Pain Medication from other Providers: You are not allowed to accept any additional pain medication from any other Doctor or Healthcare provider. There are two exceptions to this rule. (see below) In the event that you require additional pain medication, you are responsible for notifying us, as stated below. Cough Medicine: Often these contain an opioid, such as codeine or hydrocodone. Never accept or take cough medicine containing these opioids if you are already taking an opioid* medication. The combination may cause respiratory failure and death. Medication Agreement: You are responsible for carefully reading and following our Medication Agreement. This must be signed before receiving any prescriptions from our practice. Safely store a copy of your signed Agreement. Violations to the Agreement will result in no further prescriptions. (Additional copies of our Medication Agreement are available upon request.) Laws, Rules, & Regulations: All patients are expected to follow all [address] and [employer], [employer], Rules, [place] [place]. Ignorance of the Laws does not constitute a valid excuse.  Illegal drugs and Controlled Substances: The use of illegal substances (including, but not limited to marijuana and its derivatives) and/or the illegal use of any controlled substances is strictly prohibited. Violation of this rule may result in the immediate and permanent discontinuation of any and all prescriptions being written by our practice. The use of any illegal substances is prohibited. Adopted CDC guidelines & recommendations: Target dosing levels will be at or below 60 MME/day. Use of benzodiazepines** is not recommended.  Exceptions: There are only two exceptions to the rule of not receiving pain medications from other Healthcare Providers. Exception #1 (Emergencies): In the event of an emergency (i.e.: accident requiring emergency care), you are allowed to receive additional pain  medication. However, you are responsible for: As soon as  you are able, call our office [phone], at any time of the day or night, and leave a message stating your name, the date and nature of the emergency, and the name and dose of the medication prescribed. In the event that your call is answered by a member of our staff, make sure to document and save the date, time, and the name of the person that took your information.  Exception #2 (Planned Surgery): In the event that you are scheduled by another doctor or dentist to have any type of surgery or procedure, you are allowed (for a period no longer than 30 days), to receive additional pain medication, for the acute post-op pain. However, in this case, you are responsible for picking up a copy of our "Post-op Pain Management for Surgeons" handout, and giving it to your surgeon or dentist. This document is available at our office, and does not require an appointment to obtain it. Simply go to our office during business hours (Monday-Thursday from 8:00 AM to 4:00 PM) (Friday 8:00 AM to 12:00 Noon) or if you have a scheduled appointment with [place], prior to your surgery, and ask for it by name. In addition, you are responsible for: calling our office [phone], at any time of the day or night, and leaving a message stating your name, name of your surgeon, type of surgery, and date of procedure or surgery. Failure to comply with your responsibilities may result in termination of therapy involving the controlled substances. Medication Agreement Violation. Following the above rules, including your responsibilities will help you in avoiding a Medication Agreement Violation ("Breaking your Pain Medication Contract").  Consequences:  Not following the above rules may result in permanent discontinuation of medication prescription therapy.  *Opioid medications include: morphine, codeine, oxycodone, oxymorphone, hydrocodone, hydromorphone, meperidine, tramadol,  tapentadol, buprenorphine, fentanyl, methadone. **Benzodiazepine medications include: diazepam (Valium), alprazolam (Xanax), clonazepam (Klonopine), lorazepam (Ativan), clorazepate (Tranxene), chlordiazepoxide (Librium), estazolam (Prosom), oxazepam (Serax), temazepam (Restoril), triazolam (Halcion) (Last updated: 12/25/2021) ______________________________________________________________________    ______________________________________________________________________  Medication Recommendations and Reminders  Applies to: All patients receiving prescriptions (written and/or electronic).  Medication Rules & Regulations: You are responsible for reading, knowing, and following our "Medication Rules" document. These exist for your safety and that of others. They are not flexible and neither are we. Dismissing or ignoring them is an act of "non-compliance" that may result in complete and irreversible termination of such medication therapy. For safety reasons, "non-compliance" will not be tolerated. As with the [place] fundamental legal principle of "ignorance of the law is no defense", we will accept no excuses for not having read and knowing the content of documents provided to you by our practice.  Pharmacy of record:  Definition: This is the pharmacy where your electronic prescriptions will be sent.  We do not endorse any particular pharmacy. It is up to you and your insurance to decide what pharmacy to use.  We do not restrict you in your choice of pharmacy. However, once we write for your prescriptions, we will NOT be re-sending more prescriptions to fix restricted supply problems created by your pharmacy, or your insurance.  The pharmacy listed in the electronic medical record should be the one where you want electronic prescriptions to be sent. If you choose to change pharmacy, simply notify our nursing staff. Changes will be made only during your regular appointments and not over the  phone.  Recommendations: Keep all of your pain medications in a safe place, under lock and key, even  if you live alone. We will NOT replace lost, stolen, or damaged medication. We do not accept "Police Reports" as proof of medications having been stolen. After you fill your prescription, take 1 week's worth of pills and put them away in a safe place. You should keep a separate, properly labeled bottle for this purpose. The remainder should be kept in the original bottle. Use this as your primary supply, until it runs out. Once it's gone, then you know that you have 1 week's worth of medicine, and it is time to come in for a prescription refill. If you do this correctly, it is unlikely that you will ever run out of medicine. To make sure that the above recommendation works, it is very important that you make sure your medication refill appointments are scheduled at least 1 week before you run out of medicine. To do this in an effective manner, make sure that you do not leave the office without scheduling your next medication management appointment. Always ask the nursing staff to show you in your prescription , when your medication will be running out. Then arrange for the receptionist to get you a return appointment, at least 7 days before you run out of medicine. Do not wait until you have 1 or 2 pills left, to come in. This is very poor planning and does not take into consideration that we may need to cancel appointments due to bad weather, sickness, or emergencies affecting our staff. DO NOT ACCEPT A "Partial Fill": If for any reason your pharmacy does not have enough pills/tablets to completely fill or refill your prescription, do not allow for a "partial fill". The law allows the pharmacy to complete that prescription within 72 hours, without requiring a new prescription. If they do not fill the rest of your prescription within those 72 hours, you will need a separate prescription to fill the remaining  amount, which we will NOT provide. If the reason for the partial fill is your insurance, you will need to talk to the pharmacist about payment alternatives for the remaining tablets, but again, DO NOT ACCEPT A PARTIAL FILL, unless you can trust your pharmacist to obtain the remainder of the pills within 72 hours.  Prescription refills and/or changes in medication(s):  Prescription refills, and/or changes in dose or medication, will be conducted only during scheduled medication management appointments. (Applies to both, written and electronic prescriptions.) No refills on procedure days. No medication will be changed or started on procedure days. No changes, adjustments, and/or refills will be conducted on a procedure day. Doing so will interfere with the diagnostic portion of the procedure. No phone refills. No medications will be "called into the pharmacy". No Fax refills. No weekend refills. No Holliday refills. No after hours refills.  Remember:  Business hours are:  Monday to Thursday 8:00 AM to 4:00 PM Provider's Schedule: Delano Metz, MD - Appointments are:  Medication management: Monday and Wednesday 8:00 AM to 4:00 PM Procedure day: Tuesday and Thursday 7:30 AM to 4:00 PM Edward Jolly, MD - Appointments are:  Medication management: Tuesday and Thursday 8:00 AM to 4:00 PM Procedure day: Monday and Wednesday 7:30 AM to 4:00 PM (Last update: 12/25/2021) ______________________________________________________________________   ____________________________________________________________________________________________  Naloxone Nasal Spray  Why am I receiving this medication? [place] STOP ACT requires that all patients taking high dose opioids or at risk of opioids respiratory depression, be prescribed an opioid reversal agent, such as Naloxone (AKA: Narcan).  What is this medication? NALOXONE (  nal OX one) treats opioid overdose, which causes slow or shallow breathing,  severe drowsiness, or trouble staying awake. Call emergency services after using this medication. You may need additional treatment. Naloxone works by reversing the effects of opioids. It belongs to a group of medications called opioid blockers.  COMMON BRAND NAME(S): Kloxxado, Narcan  What should I tell my care team before I take this medication? They need to know if you have any of these conditions: Heart disease Substance use disorder An unusual or allergic reaction to naloxone, other medications, foods, dyes, or preservatives Pregnant or trying to get pregnant Breast-feeding  When to use this medication? This medication is to be used for the treatment of respiratory depression (less than 8 breaths per minute) secondary to opioid overdose.   How to use this medication? This medication is for use in the nose. Lay the person on their back. Support their neck with your hand and allow the head to tilt back before giving the medication. The nasal spray should be given into 1 nostril. After giving the medication, move the person onto their side. Do not remove or test the nasal spray until ready to use. Get emergency medical help right away after giving the first dose of this medication, even if the person wakes up. You should be familiar with how to recognize the signs and symptoms of a narcotic overdose. If more doses are needed, give the additional dose in the other nostril. Talk to your care team about the use of this medication in children. While this medication may be prescribed for children as young as newborns for selected conditions, precautions do apply.  Naloxone Overdosage: If you think you have taken too much of this medicine contact a poison control center or emergency room at once.  NOTE: This medicine is only for you. Do not share this medicine with others.  What if I miss a dose? This does not apply.  What may interact with this medication? This is only used during an  emergency. No interactions are expected during emergency use. This list may not describe all possible interactions. Give your health care provider a list of all the medicines, herbs, non-prescription drugs, or dietary supplements you use. Also tell them if you smoke, drink alcohol, or use illegal drugs. Some items may interact with your medicine.  What should I watch for while using this medication? Keep this medication ready for use in the case of an opioid overdose. Make sure that you have the phone number of your care team and local hospital ready. You may need to have additional doses of this medication. Each nasal spray contains a single dose. Some emergencies may require additional doses. After use, bring the treated person to the nearest hospital or call 911. Make sure the treating care team knows that the person has received a dose of this medication. You will receive additional instructions on what to do during and after use of this medication before an emergency occurs.  What side effects may I notice from receiving this medication? Side effects that you should report to your care team as soon as possible: Allergic reactions--skin rash, itching, hives, swelling of the face, lips, tongue, or throat Side effects that usually do not require medical attention (report these to your care team if they continue or are bothersome): Constipation Dryness or irritation inside the nose Headache Increase in blood pressure Muscle spasms Stuffy nose Toothache This list may not describe all possible side effects. Call your doctor for  medical advice about side effects. You may report side effects to FDA at 1-800-FDA-1088.  Where should I keep my medication? Because this is an emergency medication, you should keep it with you at all times.  Keep out of the reach of children and pets. Store between 20 and 25 degrees C (68 and 77 degrees F). Do not freeze. Throw away any unused medication after the  expiration date. Keep in original box until ready to use.  NOTE: This sheet is a summary. It may not cover all possible information. If you have questions about this medicine, talk to your doctor, pharmacist, or health care provider.   2023 Elsevier/Gold Standard (2020-10-27 00:00:00)  ____________________________________________________________________________________________

## 2022-11-06 ENCOUNTER — Telehealth: Payer: Self-pay | Admitting: Family Medicine

## 2022-11-06 ENCOUNTER — Ambulatory Visit: Payer: 59 | Attending: Pain Medicine | Admitting: Pain Medicine

## 2022-11-06 ENCOUNTER — Encounter: Payer: Self-pay | Admitting: Pain Medicine

## 2022-11-06 VITALS — BP 117/82 | HR 75 | Temp 97.5°F | Ht 66.0 in | Wt 215.0 lb

## 2022-11-06 DIAGNOSIS — M79602 Pain in left arm: Secondary | ICD-10-CM | POA: Diagnosis not present

## 2022-11-06 DIAGNOSIS — G893 Neoplasm related pain (acute) (chronic): Secondary | ICD-10-CM | POA: Diagnosis not present

## 2022-11-06 DIAGNOSIS — M79601 Pain in right arm: Secondary | ICD-10-CM | POA: Diagnosis not present

## 2022-11-06 DIAGNOSIS — C3492 Malignant neoplasm of unspecified part of left bronchus or lung: Secondary | ICD-10-CM | POA: Insufficient documentation

## 2022-11-06 DIAGNOSIS — M545 Low back pain, unspecified: Secondary | ICD-10-CM

## 2022-11-06 DIAGNOSIS — M5441 Lumbago with sciatica, right side: Secondary | ICD-10-CM | POA: Insufficient documentation

## 2022-11-06 DIAGNOSIS — G4733 Obstructive sleep apnea (adult) (pediatric): Secondary | ICD-10-CM | POA: Diagnosis not present

## 2022-11-06 DIAGNOSIS — G894 Chronic pain syndrome: Secondary | ICD-10-CM | POA: Insufficient documentation

## 2022-11-06 DIAGNOSIS — M5442 Lumbago with sciatica, left side: Secondary | ICD-10-CM | POA: Insufficient documentation

## 2022-11-06 DIAGNOSIS — M79605 Pain in left leg: Secondary | ICD-10-CM | POA: Diagnosis not present

## 2022-11-06 DIAGNOSIS — G8929 Other chronic pain: Secondary | ICD-10-CM | POA: Diagnosis not present

## 2022-11-06 DIAGNOSIS — Z79891 Long term (current) use of opiate analgesic: Secondary | ICD-10-CM | POA: Insufficient documentation

## 2022-11-06 DIAGNOSIS — M961 Postlaminectomy syndrome, not elsewhere classified: Secondary | ICD-10-CM | POA: Insufficient documentation

## 2022-11-06 DIAGNOSIS — C3432 Malignant neoplasm of lower lobe, left bronchus or lung: Secondary | ICD-10-CM | POA: Insufficient documentation

## 2022-11-06 DIAGNOSIS — Z79899 Other long term (current) drug therapy: Secondary | ICD-10-CM | POA: Diagnosis not present

## 2022-11-06 DIAGNOSIS — Z902 Acquired absence of lung [part of]: Secondary | ICD-10-CM | POA: Insufficient documentation

## 2022-11-06 MED ORDER — MORPHINE SULFATE 15 MG PO TABS
15.0000 mg | ORAL_TABLET | Freq: Four times a day (QID) | ORAL | 0 refills | Status: DC | PRN
Start: 2023-01-10 — End: 2023-02-02

## 2022-11-06 MED ORDER — MORPHINE SULFATE 15 MG PO TABS
15.0000 mg | ORAL_TABLET | Freq: Four times a day (QID) | ORAL | 0 refills | Status: DC | PRN
Start: 2022-11-11 — End: 2023-02-02

## 2022-11-06 MED ORDER — MORPHINE SULFATE 15 MG PO TABS
15.0000 mg | ORAL_TABLET | Freq: Four times a day (QID) | ORAL | 0 refills | Status: DC | PRN
Start: 2022-12-11 — End: 2023-02-02

## 2022-11-06 NOTE — Telephone Encounter (Signed)
Copied from CRM [id number]. Topic: General - Other >> Nov 06, 2022 10:57 AM [place] B wrote: Reason for CRM: pt called in about getting cpap that goes in his chest instead of over his face because he says its not working the way it should

## 2022-11-06 NOTE — Progress Notes (Signed)
Nursing Pain Medication Assessment:  Safety precautions to be maintained throughout the outpatient stay will include: orient to surroundings, keep bed in low position, maintain call bell within reach at all times, provide assistance with transfer out of bed and ambulation.  Nursing Pain Medication Assessment:  Safety precautions to be maintained throughout the outpatient stay will include: orient to surroundings, keep bed in low position, maintain call bell within reach at all times, provide assistance with transfer out of bed and ambulation.  Medication Inspection Compliance: Pill count conducted under aseptic conditions, in front of the patient. Neither the pills nor the bottle was removed from the patient's sight at any time. Once count was completed pills were immediately returned to the patient in their original bottle.  Medication: Morphine IR Pill/Patch Count:  56 of 120 pills remain Pill/Patch Appearance: Markings consistent with prescribed medication Bottle Appearance: Standard pharmacy container. Clearly labeled. Filled Date: 8 / 57 / 2024 Last Medication intake:  Today

## 2022-11-06 NOTE — Telephone Encounter (Signed)
Appointment has been made

## 2022-11-14 ENCOUNTER — Ambulatory Visit
Admission: RE | Admit: 2022-11-14 | Discharge: 2022-11-14 | Disposition: A | Discharge status: Home / Self Care | Payer: 59 | Source: Ambulatory Visit | Attending: Oncology | Admitting: Oncology

## 2022-11-14 DIAGNOSIS — C3490 Malignant neoplasm of unspecified part of unspecified bronchus or lung: Secondary | ICD-10-CM | POA: Diagnosis not present

## 2022-11-14 DIAGNOSIS — C3492 Malignant neoplasm of unspecified part of left bronchus or lung: Secondary | ICD-10-CM | POA: Diagnosis not present

## 2022-11-14 LAB — POCT I-STAT CREATININE: Creatinine, Ser: 0.8 mg/dL (ref 0.61–1.24)

## 2022-11-14 MED ORDER — IOHEXOL 300 MG/ML  SOLN
75.0000 mL | Freq: Once | INTRAMUSCULAR | Status: AC | PRN
  Administered 2022-11-14: 75 mL via INTRAVENOUS

## 2022-11-15 ENCOUNTER — Other Ambulatory Visit: Payer: Self-pay | Admitting: Family Medicine

## 2022-11-15 ENCOUNTER — Ambulatory Visit: Payer: 59 | Admitting: Family Medicine

## 2022-11-15 DIAGNOSIS — G4733 Obstructive sleep apnea (adult) (pediatric): Secondary | ICD-10-CM

## 2022-11-18 ENCOUNTER — Inpatient Hospital Stay: Payer: 59 | Admitting: Oncology

## 2022-11-22 ENCOUNTER — Ambulatory Visit: Payer: 59 | Admitting: Oncology

## 2022-11-22 ENCOUNTER — Inpatient Hospital Stay: Payer: 59 | Admitting: Oncology

## 2022-11-25 ENCOUNTER — Inpatient Hospital Stay: Payer: 59 | Admitting: Oncology

## 2022-11-26 ENCOUNTER — Ambulatory Visit: Payer: 59 | Admitting: Oncology

## 2022-12-03 ENCOUNTER — Encounter: Payer: Self-pay | Admitting: Oncology

## 2022-12-03 ENCOUNTER — Other Ambulatory Visit: Payer: Self-pay | Admitting: Family Medicine

## 2022-12-03 ENCOUNTER — Inpatient Hospital Stay: Payer: 59 | Attending: Oncology | Admitting: Oncology

## 2022-12-03 VITALS — BP 125/76 | HR 64 | Temp 96.7°F | Resp 16 | Ht 66.0 in | Wt 221.0 lb

## 2022-12-03 DIAGNOSIS — Z833 Family history of diabetes mellitus: Secondary | ICD-10-CM | POA: Insufficient documentation

## 2022-12-03 DIAGNOSIS — Z809 Family history of malignant neoplasm, unspecified: Secondary | ICD-10-CM | POA: Diagnosis not present

## 2022-12-03 DIAGNOSIS — G8929 Other chronic pain: Secondary | ICD-10-CM | POA: Insufficient documentation

## 2022-12-03 DIAGNOSIS — R42 Dizziness and giddiness: Secondary | ICD-10-CM | POA: Insufficient documentation

## 2022-12-03 DIAGNOSIS — E1122 Type 2 diabetes mellitus with diabetic chronic kidney disease: Secondary | ICD-10-CM | POA: Diagnosis not present

## 2022-12-03 DIAGNOSIS — Z886 Allergy status to analgesic agent status: Secondary | ICD-10-CM | POA: Diagnosis not present

## 2022-12-03 DIAGNOSIS — N181 Chronic kidney disease, stage 1: Secondary | ICD-10-CM | POA: Diagnosis not present

## 2022-12-03 DIAGNOSIS — Z880 Allergy status to penicillin: Secondary | ICD-10-CM | POA: Insufficient documentation

## 2022-12-03 DIAGNOSIS — Z832 Family history of diseases of the blood and blood-forming organs and certain disorders involving the immune mechanism: Secondary | ICD-10-CM | POA: Insufficient documentation

## 2022-12-03 DIAGNOSIS — Z9049 Acquired absence of other specified parts of digestive tract: Secondary | ICD-10-CM | POA: Diagnosis not present

## 2022-12-03 DIAGNOSIS — Z87891 Personal history of nicotine dependence: Secondary | ICD-10-CM | POA: Insufficient documentation

## 2022-12-03 DIAGNOSIS — C3432 Malignant neoplasm of lower lobe, left bronchus or lung: Secondary | ICD-10-CM | POA: Insufficient documentation

## 2022-12-03 DIAGNOSIS — C3492 Malignant neoplasm of unspecified part of left bronchus or lung: Secondary | ICD-10-CM

## 2022-12-03 DIAGNOSIS — Z79899 Other long term (current) drug therapy: Secondary | ICD-10-CM | POA: Diagnosis not present

## 2022-12-03 NOTE — Progress Notes (Signed)
[facility]  [phone]) [phone] [phone]  ID: [name] OB: 07-22-1960  MR#: [id number]  AOZ#:[id number]  Patient Care Team: Dorcas Carrow, DO as PCP - General (Family Medicine) Rayna Sexton, CMA (Inactive) as Certified Medical Assistant Glory Buff, RN as Registered Nurse Delano Metz, MD as Referring Physician (Pain Medicine) Jeralyn Ruths, MD as Consulting Physician (Oncology) Carmina Miller, MD as Consulting Physician (Radiation Oncology)  CHIEF COMPLAINT: Clinical stage IIIa squamous cell 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  INTERVAL HISTORY: Patient returns to clinic today for routine 73-month evaluation and discussion of his imaging results.  He has occasional lightheadedness and a "cold" feeling particularly with positional changes.  He otherwise feels well and is asymptomatic.  He has a good appetite and denies weight loss.  He has no neurologic complaints.  He denies any recent fevers or illnesses.  He has no chest pain, shortness of breath, cough, or hemoptysis.  He denies any nausea, vomiting, constipation, or diarrhea.  He has no urinary complaints.  Patient offers no further specific complaints today.  REVIEW OF SYSTEMS:   Review of Systems  Constitutional: Negative.  Negative for fever, malaise/fatigue and weight loss.  Respiratory: Negative.  Negative for cough and shortness of breath.   Cardiovascular: Negative.  Negative for chest pain and leg swelling.  Gastrointestinal: Negative.  Negative for abdominal pain and heartburn.  Genitourinary: Negative.  Negative for dysuria and flank pain.  Musculoskeletal: Negative.  Negative for back pain.  Skin: Negative.  Negative for rash.  Neurological: Negative.  Negative for dizziness, focal weakness, weakness and headaches.  Psychiatric/Behavioral: Negative.  The patient is not nervous/anxious.     As per HPI. Otherwise, a complete review of systems is  negative.  PAST MEDICAL HISTORY: Past Medical History:  Diagnosis Date   Allergy    Arthritis    left foot   Benign hypertensive kidney disease    Chronic back pain    Four rods in back   Diabetes mellitus, type 2 (HCC)    Dyspnea    GERD (gastroesophageal reflux disease)    Hypertension    Malignant neoplasm of lung (HCC)    Migraines    daily    PAST SURGICAL HISTORY: Past Surgical History:  Procedure Laterality Date   APPENDECTOMY     BACK SURGERY     COLONOSCOPY WITH PROPOFOL N/A 02/19/2016   Procedure: COLONOSCOPY WITH PROPOFOL;  Surgeon: Midge Minium, MD;  Location: [facility];  Service: Endoscopy;  Laterality: N/A;   COLONOSCOPY WITH PROPOFOL N/A 01/19/2018   Procedure: COLONOSCOPY WITH PROPOFOL;  Surgeon: Midge Minium, MD;  Location: [facility];  Service: Endoscopy;  Laterality: N/A;  Diabetic - oral meds   COLONOSCOPY WITH PROPOFOL N/A 03/16/2021   Procedure: COLONOSCOPY WITH PROPOFOL;  Surgeon: Midge Minium, MD;  Location: [facility];  Service: Endoscopy;  Laterality: N/A;  INADEQUATE PREP   COLONOSCOPY WITH PROPOFOL N/A 04/06/2021   Procedure: COLONOSCOPY WITH PROPOFOL;  Surgeon: Midge Minium, MD;  Location: [facility];  Service: Endoscopy;  Laterality: N/A;   DG OPERATIVE LEFT HIP ([facility] HX)     10/19   ELECTROMAGNETIC NAVIGATION BROCHOSCOPY Left 11/18/2018   Procedure: ELECTROMAGNETIC NAVIGATION BRONCHOSCOPY;  Surgeon: Salena Saner, MD;  Location: [facility] ORS;  Service: Cardiopulmonary;  Laterality: Left;   FLEXIBLE BRONCHOSCOPY Bilateral 01/20/2019   Procedure: FLEXIBLE BRONCHOSCOPY;  Surgeon: Vida Rigger, MD;  Location: [facility] ORS;  Service: Thoracic;  Laterality: Bilateral;   FLEXIBLE  BRONCHOSCOPY Bilateral 01/22/2019   Procedure: FLEXIBLE BRONCHOSCOPY;  Surgeon: Vida Rigger, MD;  Location: [facility] ORS;  Service: Thoracic;  Laterality: Bilateral;   FOOT SURGERY Left    Screws and plates   JOINT REPLACEMENT Left 12/2017    DR Rosita Kea Hip   KNEE SURGERY Left    X 2   LEG SURGERY     LUNG CANCER SURGERY     POLYPECTOMY N/A 02/19/2016   Procedure: POLYPECTOMY;  Surgeon: Midge Minium, MD;  Location: [facility];  Service: Endoscopy;  Laterality: N/A;   POLYPECTOMY  01/19/2018   Procedure: POLYPECTOMY;  Surgeon: Midge Minium, MD;  Location: [facility];  Service: Endoscopy;;   PORTACATH PLACEMENT N/A 11/11/2019   Procedure: INSERTION PORT-A-CATH;  Surgeon: Hulda Marin, MD;  Location: [facility] ORS;  Service: General;  Laterality: N/A;   THORACOTOMY Left 01/14/2019   Procedure: THORACOTOMY MAJOR, LEFT;  Surgeon: Hulda Marin, MD;  Location: [facility] ORS;  Service: General;  Laterality: Left;   TOTAL HIP ARTHROPLASTY Left 12/02/2017   Procedure: TOTAL HIP ARTHROPLASTY ANTERIOR APPROACH;  Surgeon: Kennedy Bucker, MD;  Location: [facility] ORS;  Service: Orthopedics;  Laterality: Left;   VIDEO BRONCHOSCOPY Left 01/14/2019   Procedure: VIDEO BRONCHOSCOPY WITH FLUORO, LEFT;  Surgeon: Hulda Marin, MD;  Location: [facility] OR[facility];  Service: General;  Laterality: Left;   VIDEO BRONCHOSCOPY WITH ENDOBRONCHIAL NAVIGATION N/A 10/15/2019   Procedure: VIDEO BRONCHOSCOPY WITH ENDOBRONCHIAL NAVIGATION;  Surgeon: Vida Rigger, MD;  Location: [facility] ORS;  Service: Thoracic;  Laterality: N/A;   VIDEO BRONCHOSCOPY WITH ENDOBRONCHIAL ULTRASOUND N/A 10/15/2019   Procedure: VIDEO BRONCHOSCOPY WITH ENDOBRONCHIAL ULTRASOUND;  Surgeon: Vida Rigger, MD;  Location: [facility] ORS;  Service: Thoracic;  Laterality: N/A;    FAMILY HISTORY: Family History  Problem Relation Age of Onset   Cancer Father    Diabetes Sister    Thrombosis Sister     ADVANCED DIRECTIVES (Y/N):  N  HEALTH MAINTENANCE: Social History   Tobacco Use   Smoking status: Former    Current packs/day: 0.00    Average packs/day: 0.3 packs/day for 35.0 years (8.8 ttl pk-yrs)    Types: Cigarettes    Start date: 01/14/1984    Quit date: 01/14/2019    Years since quitting: 3.8    Smokeless tobacco: Never  Vaping Use   Vaping status: Never Used  Substance Use Topics   Alcohol use: No    Alcohol/week: 0.0 standard drinks of alcohol   Drug use: Never     Colonoscopy:  PAP:  Bone density:  Lipid panel:  Allergies  Allergen Reactions   Acetaminophen Swelling   Aspirin Anaphylaxis   Epinephrine Anaphylaxis    Does not include albuterol   Novocain [Procaine] Anaphylaxis   Penicillins Anaphylaxis    Has patient had a PCN reaction causing immediate rash, facial/tongue/throat swelling, SOB or lightheadedness with hypotension: Yes Has patient had a PCN reaction causing severe rash involving mucus membranes or skin necrosis: No Has patient had a PCN reaction that required hospitalization: Yes Has patient had a PCN reaction occurring within the last 10 years: No If all of the above answers are "NO", then may proceed with Cephalosporin use.    Strawberry Extract Anaphylaxis   Shellfish Allergy Hives and Nausea And Vomiting    Current Outpatient Medications  Medication Sig Dispense Refill   Accu-Chek FastClix Lancets MISC USE TO TEST BLOOD SUGAR 2X A DAY 100 each 12   albuterol (PROVENTIL) (2.5 MG/3ML) 0.083% nebulizer solution USE 1 VIAL VIA NEBULIZER EVERY 6  HOURS AS NEEDED FOR WHEEZING OR SHORTNESS OF BREATH 150 mL 1   amLODipine (NORVASC) 5 MG tablet TAKE 1 TABLET(5 MG) BY MOUTH TWICE DAILY 180 tablet 1   atorvastatin (LIPITOR) 10 MG tablet Take 1 tablet (10 mg total) by mouth daily at 6 PM. 90 tablet 1   BREZTRI AEROSPHERE 160-9-4.8 MCG/ACT AERO Inhale 2 puffs into the lungs 2 (two) times daily. Maintenance per pulmonology 5.9 g 6   diphenhydrAMINE (BENADRYL) 25 MG tablet Take 75 mg by mouth in the morning and at bedtime.     empagliflozin (JARDIANCE) 25 MG TABS tablet TAKE 1 TABLET(25 MG) BY MOUTH DAILY BEFORE BREAKFAST 90 tablet 1   esomeprazole (NEXIUM) 40 MG capsule TAKE 1 CAPSULE(40 MG) BY MOUTH DAILY 90 capsule 3   glucose blood (ACCU-CHEK GUIDE) test  strip USE TO TEST BLOOD SUGAR 2X A DAY 100 strip 3   hydrALAZINE (APRESOLINE) 100 MG tablet Take 1 tablet (100 mg total) by mouth 2 (two) times daily. 180 tablet 1   lidocaine (LIDODERM) 5 % UNWRAP AND APPLY 1 PATCH ONTO THE SKIN DAILY. REMOVE AND DISCARD PATCH WITHIN 12 HOURS OR AS DIRECTED BY MD 28 patch 12   metFORMIN (GLUCOPHAGE) 500 MG tablet TAKE 2 TABLETS(1000 MG) BY MOUTH TWICE DAILY WITH A MEAL 360 tablet 1   metoprolol succinate (TOPROL-XL) 100 MG 24 hr tablet Take with or immediately following a meal. 180 tablet 1   metoprolol tartrate (LOPRESSOR) 50 MG tablet Take 1 tablet (50 mg total) by mouth 2 (two) times daily. TO BE TAKEN WITH 100 MG METOPROLOL TARTRATE 180 tablet 1   montelukast (SINGULAIR) 10 MG tablet TAKE 1 TABLET(10 MG) BY MOUTH DAILY 90 tablet 1   morphine (MSIR) 15 MG tablet Take 1 tablet (15 mg total) by mouth every 6 (six) hours as needed for moderate pain or severe pain. Must last 30 days. 120 tablet 0   [START ON 12/11/2022] morphine (MSIR) 15 MG tablet Take 1 tablet (15 mg total) by mouth every 6 (six) hours as needed for moderate pain or severe pain. Must last 30 days. 120 tablet 0   [START ON 01/10/2023] morphine (MSIR) 15 MG tablet Take 1 tablet (15 mg total) by mouth every 6 (six) hours as needed for moderate pain or severe pain. Must last 30 days. 120 tablet 0   Multiple Vitamin (MULTIVITAMIN WITH MINERALS) TABS tablet Take 1 tablet by mouth daily.     naloxone (NARCAN) nasal spray 4 mg/0.1 mL Place 1 spray into the nose as needed for up to 365 doses (for opioid-induced respiratory depresssion). In case of emergency (overdose), spray once into each nostril. If no response within 3 minutes, repeat application and call 911. 1 each 0   nortriptyline (PAMELOR) 25 MG capsule TAKE 2 CAPSULES(50 MG) BY MOUTH AT BEDTIME 180 capsule 1   Potassium Chloride ER 20 MEQ TBCR Take 1 tablet (20 mEq total) by mouth daily. 90 tablet 1   Semaglutide (RYBELSUS) 14 MG TABS Take 1 tablet (14  mg total) by mouth daily. 90 tablet 1   STIOLTO RESPIMAT 2.5-2.5 MCG/ACT AERS Inhale 2 puffs into the lungs 4 (four) times daily as needed. Prn per pulmonology notes 4 g 12   SUMAtriptan (IMITREX) 50 MG tablet Take 1 tablet (50 mg total) by mouth every 2 (two) hours as needed for migraine. Take 1 tab at onset of migraine. May repeat in 2 hours if headache persists or recurs. 10 tablet 12   albuterol (VENTOLIN HFA)  108 (90 Base) MCG/ACT inhaler Inhale into the lungs.     No current facility-administered medications for this visit.   Facility-Administered Medications Ordered in Other Visits  Medication Dose Route Frequency Provider Last Rate Last Admin   0.9 %  sodium chloride infusion    Continuous PRN Lily Kocher, CRNA   New Bag at 01/22/19 0705   sodium chloride flush (NS) 0.9 % injection 10 mL  10 mL Intracatheter PRN Jeralyn Ruths, MD   10 mL at 03/01/20 0953    OBJECTIVE: Vitals:   12/03/22 0914  BP: 125/76  Pulse: 64  Resp: 16  Temp: (!) 96.7 F (35.9 C)  SpO2: 98%      Body mass index is 35.67 kg/m.    ECOG FS:0 - Asymptomatic  General: Well-developed, well-nourished, no acute distress. Eyes: Pink conjunctiva, anicteric sclera. HEENT: Normocephalic, moist mucous membranes. Lungs: No audible wheezing or coughing. Heart: Regular rate and rhythm. Abdomen: Soft, nontender, no obvious distention. Musculoskeletal: No edema, cyanosis, or clubbing. Neuro: Alert, answering all questions appropriately. Cranial nerves grossly intact. Skin: No rashes or petechiae noted. Psych: Normal affect.  LAB RESULTS:  Lab Results  Component Value Date   NA 141 09/03/2022   K 3.6 09/03/2022   CL 102 09/03/2022   CO2 25 09/03/2022   GLUCOSE 131 (H) 09/03/2022   BUN 6 (L) 09/03/2022   CREATININE 0.80 11/14/2022   CALCIUM 9.1 09/03/2022   PROT 6.9 09/03/2022   ALBUMIN 4.6 09/03/2022   AST 27 09/03/2022   ALT 28 09/03/2022   ALKPHOS 161 (H) 09/03/2022   BILITOT 0.4 09/03/2022    GFRNONAA >60 07/05/2021   GFRAA >60 12/01/2019    Lab Results  Component Value Date   WBC 5.1 09/03/2022   NEUTROABS 3.4 09/03/2022   HGB 13.7 09/03/2022   HCT 42.1 09/03/2022   MCV 90 09/03/2022   PLT 145 (L) 09/03/2022     STUDIES:   ONCOLOGY HISTORY: Patient underwent lung resection on January 14, 2019 and had a complicated postoperative course. CT scan results from August 14, 2019 reviewed independently with a suspicious subcarinal lymph node measuring 1.4 cm.  Follow-up PET scan on August 23, 2019 revealed hypermetabolism in lymph node highly suspicious for recurrence.  Biopsy confirmed recurrence increasing patient's stage from IA up to IIIA. Patient completed concurrent XRT and weekly carboplatinum and Taxol on January 05, 2020.  Patient completed 1 year of maintenance durvalumab on January 17, 2021.  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  PLAN:    Clinical stage IIIa squamous cell carcinoma of the left lower lobe lung: Patient's most recent CT scan on November 14, 2022 reviewed independently with no evidence of recurrent or progressive disease.  Patient is now greater than 2 years removed from completing his maintenance treatment and can be transitioned to yearly imaging.  Return to clinic in 1 year with repeat CT scan and further evaluation.   Lightheadedness/cold sensation: Given that it is positional, suspect related to blood pressure.  Patient reports he has an appointment with his primary care in the next several weeks.   Pain: Patient does not complain of this today.  Unrelated to the of malignancy.  Continue monitoring and treatment per pain clinic.  I spent a total of 20 minutes reviewing chart data, face-to-face evaluation with the patient, counseling and coordination of care as detailed above.    Patient expressed understanding and was in agreement with this plan. He also  understands that He can call clinic at any time  with any questions, concerns, or complaints.    Cancer Staging  Squamous cell carcinoma of left lung [facility]) Staging form: Lung, AJCC 8th Edition - Clinical stage from 02/12/2019: Stage IIIA (cT1b, cN2, cM0) - Signed by Jeralyn Ruths, MD on 11/01/2019   Jeralyn Ruths, MD   12/03/2022 9:42 AM

## 2022-12-03 NOTE — Progress Notes (Signed)
Feels like a coldness feeling on the "inside" of his body.

## 2022-12-03 NOTE — Telephone Encounter (Signed)
Requested medications are due for refill today.  unsure  Requested medications are on the active medications list.  yes  Last refill. 09/03/2022 #180 1 rf  Future visit scheduled.   yes  Notes to clinic.  Pt has metoprolol 50, and metoprolol 100 XR on med sheet. Please review for refill.    Requested Prescriptions  Pending Prescriptions Disp Refills   metoprolol tartrate (LOPRESSOR) 50 MG tablet [Pharmacy Med Name: METOPROLOL TARTRATE 50MG  TABLETS] 180 tablet 1    Sig: TAKE 1 TABLET BY MOUTH TWICE DAILY. TO BE TAKEN WITH 100 MG METOPROLOL     Cardiovascular:  Beta Blockers Passed - 12/03/2022  8:04 AM      Passed - Last BP in normal range    BP Readings from Last 1 Encounters:  12/03/22 125/76         Passed - Last Heart Rate in normal range    Pulse Readings from Last 1 Encounters:  12/03/22 64         Passed - Valid encounter within last 6 months    Recent Outpatient Visits           3 months ago Aortic atherosclerosis (HCC)   [place] [facility] [place], [name], DO   5 months ago Type 2 diabetes mellitus with stage 1 chronic kidney disease, without long-term current use of insulin (HCC)   [place] [facility] [place], [name], DO   8 months ago Type 2 diabetes mellitus with stage 1 chronic kidney disease, without long-term current use of insulin (HCC)   [place] [facility] [place], [name], DO   9 months ago Chronic pain syndrome   [place] [facility] [place], [name], DO   11 months ago Type 2 diabetes mellitus with stage 1 chronic kidney disease, without long-term current use of insulin (HCC)   [place] [facility] [place], [place], DO       Future Appointments             In 2 days Dorcas Carrow, DO [facility] [name]            Signed Prescriptions Disp Refills   lidocaine (LIDODERM) 5 % 28 patch 8    Sig: UNWRAP AND APPLY 1 PATCH TOPICALLY ONTO  THE SKIN DAILY. REMOVE AND DISCARD PATCH WITHIN 12 HOURS OR AS DIRECTED     Analgesics:  Topicals Failed - 12/03/2022  8:04 AM      Failed - Manual Review: Labs are only required if the patient has taken medication for more than 8 weeks.      Failed - PLT in normal range and within 360 days    Platelets  Date Value Ref Range Status  09/03/2022 145 (L) 150 - 450 x10E3/uL Final         Passed - HGB in normal range and within 360 days    Hemoglobin  Date Value Ref Range Status  09/03/2022 13.7 13.0 - 17.7 g/dL Final         Passed - HCT in normal range and within 360 days    Hematocrit  Date Value Ref Range Status  09/03/2022 42.1 37.5 - 51.0 % Final         Passed - Cr in normal range and within 360 days    Creatinine, Ser  Date Value Ref Range Status  11/14/2022 0.80 0.61 - 1.24 mg/dL Final  Passed - eGFR is 30 or above and within 360 days    GFR calc Af Amer  Date Value Ref Range Status  12/01/2019 >60 >60 mL/min Final   GFR, Estimated  Date Value Ref Range Status  07/05/2021 >60 >60 mL/min Final    Comment:    (NOTE) Calculated using the CKD-EPI Creatinine Equation (2021)    eGFR  Date Value Ref Range Status  09/03/2022 98 >59 mL/min/1.73 Final         Passed - Patient is not pregnant      Passed - Valid encounter within last 12 months    Recent Outpatient Visits           3 months ago Aortic atherosclerosis (HCC)   [place] [facility] [place], [name], DO   5 months ago Type 2 diabetes mellitus with stage 1 chronic kidney disease, without long-term current use of insulin (HCC)   [place] [facility] [place], [name], DO   8 months ago Type 2 diabetes mellitus with stage 1 chronic kidney disease, without long-term current use of insulin (HCC)   [place] [facility] [place], [name], DO   9 months ago Chronic pain syndrome   [place] [facility] [place], [name], DO   11 months ago  Type 2 diabetes mellitus with stage 1 chronic kidney disease, without long-term current use of insulin (HCC)   [place] [facility] [place], Oralia Rud, DO       Future Appointments             In 2 days Dorcas Carrow, DO [place] [facility], [name]

## 2022-12-03 NOTE — Telephone Encounter (Signed)
Requested Prescriptions  Pending Prescriptions Disp Refills   lidocaine (LIDODERM) 5 % [Pharmacy Med Name: LIDOCAINE 5% PATCH] 28 patch 8    Sig: UNWRAP AND APPLY 1 PATCH TOPICALLY ONTO THE SKIN DAILY. REMOVE AND DISCARD PATCH WITHIN 12 HOURS OR AS DIRECTED     Analgesics:  Topicals Failed - 12/03/2022  8:04 AM      Failed - Manual Review: Labs are only required if the patient has taken medication for more than 8 weeks.      Failed - PLT in normal range and within 360 days    Platelets  Date Value Ref Range Status  09/03/2022 145 (L) 150 - 450 x10E3/uL Final         Passed - HGB in normal range and within 360 days    Hemoglobin  Date Value Ref Range Status  09/03/2022 13.7 13.0 - 17.7 g/dL Final         Passed - HCT in normal range and within 360 days    Hematocrit  Date Value Ref Range Status  09/03/2022 42.1 37.5 - 51.0 % Final         Passed - Cr in normal range and within 360 days    Creatinine, Ser  Date Value Ref Range Status  11/14/2022 0.80 0.61 - 1.24 mg/dL Final         Passed - eGFR is 30 or above and within 360 days    GFR calc Af Amer  Date Value Ref Range Status  12/01/2019 >60 >60 mL/min Final   GFR, Estimated  Date Value Ref Range Status  07/05/2021 >60 >60 mL/min Final    Comment:    (NOTE) Calculated using the CKD-EPI Creatinine Equation (2021)    eGFR  Date Value Ref Range Status  09/03/2022 98 >59 mL/min/1.73 Final         Passed - Patient is not pregnant      Passed - Valid encounter within last 12 months    Recent Outpatient Visits           3 months ago Aortic atherosclerosis (HCC)   [place] [facility] [place], [name], DO   5 months ago Type 2 diabetes mellitus with stage 1 chronic kidney disease, without long-term current use of insulin (HCC)   [place] [facility] [place], [name], DO   8 months ago Type 2 diabetes mellitus with stage 1 chronic kidney disease, without long-term current use of  insulin (HCC)   [place] [facility] [place], [name], DO   9 months ago Chronic pain syndrome   [place] [facility] [place], [name], DO   11 months ago Type 2 diabetes mellitus with stage 1 chronic kidney disease, without long-term current use of insulin (HCC)   [place] [facility] [place], Oralia Rud, DO       Future Appointments             In 2 days [place], Oralia Rud, DO [name]             metoprolol tartrate (LOPRESSOR) 50 MG tablet [Pharmacy Med Name: METOPROLOL TARTRATE 50MG  TABLETS] 180 tablet 1    Sig: TAKE 1 TABLET BY MOUTH TWICE DAILY. TO BE TAKEN WITH 100 MG METOPROLOL     Cardiovascular:  Beta Blockers Passed - 12/03/2022  8:04 AM      Passed - Last BP in normal range    BP Readings  from Last 1 Encounters:  12/03/22 125/76         Passed - Last Heart Rate in normal range    Pulse Readings from Last 1 Encounters:  12/03/22 64         Passed - Valid encounter within last 6 months    Recent Outpatient Visits           3 months ago Aortic atherosclerosis (HCC)   [place] [facility] [place], [name], DO   5 months ago Type 2 diabetes mellitus with stage 1 chronic kidney disease, without long-term current use of insulin (HCC)   [place] [facility] [place], [name], DO   8 months ago Type 2 diabetes mellitus with stage 1 chronic kidney disease, without long-term current use of insulin (HCC)   [facility], [name], DO   9 months ago Chronic pain syndrome   [place] [facility] [place], [name], DO   11 months ago Type 2 diabetes mellitus with stage 1 chronic kidney disease, without long-term current use of insulin (HCC)   [place] [facility] [place], Oralia Rud, DO       Future Appointments             In 2 days Dorcas Carrow, DO  [name]

## 2022-12-05 ENCOUNTER — Encounter: Payer: Self-pay | Admitting: Family Medicine

## 2022-12-05 ENCOUNTER — Ambulatory Visit (INDEPENDENT_AMBULATORY_CARE_PROVIDER_SITE_OTHER): Payer: 59 | Admitting: Family Medicine

## 2022-12-05 VITALS — BP 121/83 | HR 67 | Ht 66.0 in | Wt 218.0 lb

## 2022-12-05 DIAGNOSIS — N181 Chronic kidney disease, stage 1: Secondary | ICD-10-CM

## 2022-12-05 DIAGNOSIS — Z7984 Long term (current) use of oral hypoglycemic drugs: Secondary | ICD-10-CM | POA: Diagnosis not present

## 2022-12-05 DIAGNOSIS — E1122 Type 2 diabetes mellitus with diabetic chronic kidney disease: Secondary | ICD-10-CM

## 2022-12-05 NOTE — Progress Notes (Signed)
BP 121/83   Pulse 67   Ht 5\' 6"  (1.676 m)   Wt 218 lb (98.9 kg)   SpO2 98%   BMI 35.19 kg/m    Subjective:    Patient ID: Kristopher [name], male    DOB: 29-Nov-1960, 62 y.o.   MRN: [id number]  HPI: Kristopher [name] is a 62 y.o. male  Chief Complaint  Patient presents with   Diabetes   DIABETES Hypoglycemic episodes:no Polydipsia/polyuria: no Visual disturbance: no Chest pain: no Paresthesias: no Glucose Monitoring: yes  Accucheck frequency: Daily Taking Insulin?: no Blood Pressure Monitoring: not checking Retinal Examination: Not up to Date Foot Exam: Up to Date Diabetic Education: Completed Pneumovax: Up to Date Influenza: Not up to Date Aspirin: no  Relevant past medical, surgical, family and social history reviewed and updated as indicated. Interim medical history since our last visit reviewed. Allergies and medications reviewed and updated.  Review of Systems  Constitutional: Negative.   Respiratory: Negative.    Cardiovascular: Negative.   Gastrointestinal: Negative.        Gets an almost shivery feeling in his midline  Musculoskeletal: Negative.   Neurological: Negative.   Psychiatric/Behavioral: Negative.      Per HPI unless specifically indicated above     Objective:    BP 121/83   Pulse 67   Ht 5\' 6"  (1.676 m)   Wt 218 lb (98.9 kg)   SpO2 98%   BMI 35.19 kg/m   Wt Readings from Last 3 Encounters:  12/05/22 218 lb (98.9 kg)  12/03/22 221 lb (100.2 kg)  11/06/22 215 lb (97.5 kg)    Physical Exam Vitals and nursing note reviewed.  Constitutional:      General: He is not in acute distress.    Appearance: Normal appearance. He is obese. He is not ill-appearing, toxic-appearing or diaphoretic.  HENT:     Head: Normocephalic and atraumatic.     Right Ear: External ear normal.     Left Ear: External ear normal.     Nose: Nose normal.     Mouth/Throat:     Mouth: Mucous membranes are moist.     Pharynx: Oropharynx is clear.  Eyes:      General: No scleral icterus.       Right eye: No discharge.        Left eye: No discharge.     Extraocular Movements: Extraocular movements intact.     Conjunctiva/sclera: Conjunctivae normal.     Pupils: Pupils are equal, round, and reactive to light.  Cardiovascular:     Rate and Rhythm: Normal rate and regular rhythm.     Pulses: Normal pulses.     Heart sounds: Normal heart sounds. No murmur heard.    No friction rub. No gallop.  Pulmonary:     Effort: Pulmonary effort is normal. No respiratory distress.     Breath sounds: No stridor. Wheezing (L lung only) present. No rhonchi or rales.  Chest:     Chest wall: No tenderness.  Musculoskeletal:        General: Normal range of motion.     Cervical back: Normal range of motion and neck supple.  Skin:    General: Skin is warm and dry.     Capillary Refill: Capillary refill takes less than 2 seconds.     Coloration: Skin is not jaundiced or pale.     Findings: No bruising, erythema, lesion or rash.  Neurological:     General: No focal deficit  present.     Mental Status: He is alert and oriented to person, place, and time. Mental status is at baseline.  Psychiatric:        Mood and Affect: Mood normal.        Behavior: Behavior normal.        Thought Content: Thought content normal.        Judgment: Judgment normal.     Results for orders placed or performed during the hospital encounter of 11/14/22  I-STAT creatinine  Result Value Ref Range   Creatinine, Ser 0.80 0.61 - 1.24 mg/dL      Assessment & Plan:   Problem List Items Addressed This Visit       Endocrine   Type 2 diabetes mellitus with stage 1 chronic kidney disease, without long-term current use of insulin (HCC) - Primary    Labs drawn today. Await results. Treat as needed.       Relevant Orders   Hgb A1c w/o eAG     Follow up plan: Return in about 3 months (around 03/07/2023).

## 2022-12-05 NOTE — Assessment & Plan Note (Signed)
Labs drawn today. Await results. Treat as needed.  

## 2022-12-06 LAB — HGB A1C W/O EAG: Hgb A1c MFr Bld: 6.7 % — ABNORMAL HIGH (ref 4.8–5.6)

## 2022-12-12 DIAGNOSIS — H2513 Age-related nuclear cataract, bilateral: Secondary | ICD-10-CM | POA: Diagnosis not present

## 2022-12-12 DIAGNOSIS — E119 Type 2 diabetes mellitus without complications: Secondary | ICD-10-CM | POA: Diagnosis not present

## 2022-12-12 DIAGNOSIS — I10 Essential (primary) hypertension: Secondary | ICD-10-CM | POA: Diagnosis not present

## 2022-12-12 LAB — HM DIABETES EYE EXAM

## 2022-12-16 ENCOUNTER — Encounter: Payer: Self-pay | Admitting: Family Medicine

## 2023-01-13 ENCOUNTER — Ambulatory Visit (INDEPENDENT_AMBULATORY_CARE_PROVIDER_SITE_OTHER): Payer: 59 | Admitting: Internal Medicine

## 2023-01-13 ENCOUNTER — Encounter: Payer: Self-pay | Admitting: Internal Medicine

## 2023-01-13 VITALS — BP 130/86 | HR 68 | Temp 97.5°F | Ht 66.0 in | Wt 215.6 lb

## 2023-01-13 DIAGNOSIS — G4733 Obstructive sleep apnea (adult) (pediatric): Secondary | ICD-10-CM

## 2023-01-13 DIAGNOSIS — R911 Solitary pulmonary nodule: Secondary | ICD-10-CM | POA: Diagnosis not present

## 2023-01-13 DIAGNOSIS — J4520 Mild intermittent asthma, uncomplicated: Secondary | ICD-10-CM | POA: Diagnosis not present

## 2023-01-13 NOTE — Progress Notes (Signed)
Subjective:    Patient ID: Kristopher [name], male    DOB: 04-08-1960, 62 y.o.   MRN: [id number]   CC Assessment today for Inspire device has diagnosis of OSA Previous h/o abnormal CT chest with lung nodule-stage III squamous cell carcinoma Tobacco abuse Follow-up assessment for asthma  HPI 62 year old current smoker (1 PPD) who presents for follow-up on a LEFT lower lobe nodule (superior segment).   The patient underwent navigational bronchoscopy on 18 November 2018.    Lung nodule assessment and documentation The lesion was initially noted on a CT scan of the chest of 21 August 2017 this was for lung cancer screening purposes.   At that time the scan showed bilateral pulmonary nodules and the particular nodule in question was 5.6 mm.   Subsequently he had follow-up CT on 16 October 2018 which showed that the nodule in question was now measuring 10.3 mm and had apparent bronchograms through it.   PET/CT obtained on 2 September showed low SUV of 2.06 however slow-growing neoplasm could not be excluded.   Patient has remained asymptomatic with regards to this nodule.  As noted he underwent navigational bronchoscopy and Endo microscopy.  Endo-microscopy revealed that the target lesion had been reached to successfully however the characteristics were ill-defined and the lesion appeared to be surrounded by inflammation.  Pathology is consistent with inflammatory change including reactive bronchial cells mixed inflammatory cells and eosinophils.    Because of this we have made a referral to thoracic surgery for evaluation of wedge resection.   ONCOLOGY HISTORY:  Patient underwent lung resection on January 14, 2019 and had a complicated postoperative course.  CT scan results from August 14, 2019 a suspicious subcarinal lymph node measuring 1.4 cm.  Follow-up PET scan on August 23, 2019 revealed hypermetabolism in lymph node highly suspicious for recurrence.  Biopsy confirmed recurrence increasing  patient's stage from IA up to IIIA.  Patient completed concurrent XRT and weekly carboplatinum and Taxol on January 05, 2020. Patient completed 1 year of maintenance durvalumab on January 17, 2021.    OSA history Patient previously diagnosed with obstructive sleep apnea several years ago Patient underwent CPAP therapy however could not tolerated During that time patient also was undergoing therapy for his lung cancer  Patient here to reestablish diagnosis for sleep apnea and to get assessed for inspire device Instructions provided to patient about what the inspire device is   Regarding asthma Patient uses Breztri and albuterol as needed +exacerbation at this time-exposed to cats No evidence of heart failure at this time No evidence or signs of infection at this time No respiratory distress No fevers, chills, nausea, vomiting, diarrhea No evidence of lower extremity edema No evidence hemoptysis No indication for antibiotics or prednisone at this time   Active Ambulatory Problems    Diagnosis Date Noted   Chronic low back pain (1ry area of Pain) (Bilateral) (L>R) 01/05/2015   Lumbar spondylosis 01/05/2015   Chronic lumbar radicular pain (S1 dermatomal) (Left) 01/05/2015   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) 01/05/2015   Chronic neck pain (posterior midline) (Bilateral) (L>R) 01/05/2015   Cervical spondylosis 01/05/2015   Chronic cervical radicular pain (Bilateral) (C5/C6 dermatome) (L>R) 01/05/2015   Long term current use of opiate analgesic 01/05/2015   Long term prescription opiate use 01/05/2015   Opiate use (60 MME/Day) 01/05/2015   Uncomplicated opioid dependence (HCC) 01/05/2015   Cervical spinal stenosis (C4-5) 01/06/2015   Cervical foraminal stenosis (Bilateral C5-6) 01/06/2015  Hypokalemia 02/01/2015   Retrolisthesis of L5-S1 02/06/2015   Cervical disc herniation (C4-5 and C5-6) 02/06/2015   Lumbar disc herniation (L5-S1) 02/06/2015    Chronic sacroiliac joint pain (Bilateral) (L>R) 05/31/2015   Chronic hip pain (Left) 05/31/2015   Lumbar facet syndrome (Bilateral) (L>R) 05/31/2015   Chronic lower extremity pain (2ry area of Pain) (Left) 05/31/2015   Greater occipital neuralgia (Right) 05/31/2015   Chronic pain syndrome 01/11/2016   Benign hypertensive renal disease 01/22/2016   Migraine 01/22/2016   Pharmacologic therapy    Benign neoplasm of ascending colon    Polyp of sigmoid colon    Rectal polyp    History of tobacco abuse 06/24/2016   GERD (gastroesophageal reflux disease) 06/24/2016   Musculoskeletal pain, chronic 04/03/2017   Left-sided weakness 08/13/2017   Aortic atherosclerosis (HCC) 08/25/2017   Hip arthritis 10/01/2017   S/P hip replacement 12/02/2017   Type 2 diabetes mellitus with stage 1 chronic kidney disease, without long-term current use of insulin (HCC) 12/30/2017   History of colonic polyps 02/02/2019   Hyperlipidemia associated with type 2 diabetes mellitus (HCC) 07/20/2018   History of allergy to ester type local anesthetic (procaine) 10/21/2018   Left lower lobe pulmonary nodule 10/26/2018   Lung mass 01/14/2019   Squamous cell carcinoma of left lung (HCC) 01/16/2019   Chronic upper extremity pain (3ry area of Pain) (Bilateral) (L>R) 01/19/2019   Shortness of breath 02/02/2019   OSA (obstructive sleep apnea) 06/16/2019   Tobacco abuse 06/24/2016   Pulmonary emphysema (HCC) 08/25/2019   Goals of care, counseling/discussion 11/01/2019   Chronic use of opiate for therapeutic purpose 08/20/2020   Port-A-Cath in place 07/12/2021   Cancer associated pain 11/06/2022   Malignant neoplasm of lower lobe of left lung (HCC) 11/06/2022   History of lower lobectomy of lung (Left) 11/06/2022   Resolved Ambulatory Problems    Diagnosis Date Noted   Encounter for therapeutic drug level monitoring 01/05/2015   Allergy history, anesthetic (Unconfirmed allergy to Lidocaine) 05/31/2015   HTN  (hypertension) 08/13/2017   Atrial fibrillation with RVR (HCC) 02/02/2019   Shortness of breath    Acute respiratory failure with hypoxia (HCC) 02/02/2019   Hospital discharge follow-up 02/06/2019   Postoperative atrial fibrillation (HCC) 02/02/2019   Sinus congestion 04/10/2021   Past Medical History:  Diagnosis Date   Allergy    Arthritis    Benign hypertensive kidney disease    Chronic back pain    Diabetes mellitus, type 2 (HCC)    Dyspnea    Hypertension    Malignant neoplasm of lung (HCC)    Migraines      BP 130/86 (BP Location: Right Wrist, Cuff Size: Normal)   Pulse 68   Temp (!) 97.5 F (36.4 C)   Ht 5\' 6"  (1.676 m)   Wt 215 lb 9.6 oz (97.8 kg)   SpO2 95%   BMI 34.80 kg/m     Review of Systems: Gen:  Denies  fever, sweats, chills weight loss  HEENT: Denies blurred vision, double vision, ear pain, eye pain, hearing loss, nose bleeds, sore throat Cardiac:  No dizziness, chest pain or heaviness, chest tightness,edema, No JVD Resp:   No cough, -sputum production, +shortness of breath,+wheezing, -hemoptysis,  Other:  All other systems negative   Physical Examination:   General Appearance: No distress  EYES PERRLA, EOM intact.   NECK Supple, No JVD Pulmonary: normal breath sounds, + wheezing.  CardiovascularNormal S1,S2.  No m/r/g.   Abdomen: Benign, Soft, non-tender. Neurology  UE/LE 5/5 strength, no focal deficits Ext pulses intact, cap refill intact ALL OTHER ROS ARE NEGATIVE       Assessment & Plan:   63 year old pleasant white male with a previous history of tobacco abuse with left lower lobe lung nodule consistent with stage III lung squamous cell cancer carcinoma which had metastasized to  subcarinal lymph node undergoing oncological assessment and review in the setting of mild persistent asthma with a history of reflux disease and tobacco abuse with underlying OSA, currently with mild asthma exacerbation due to exposure to cats  Regarding  OSA Previous diagnosis of sleep apnea Patient will need reestablishment of diagnosed with sleep apnea with HST Will provide information to refer patient to inspire device assessment   Left lower lobe lung nodule with stage III squamous cell lung cancer Follow-up oncology Last CT scan of the chest in September 2024 did not show any significant changes Independently reviewed by me today Results reviewed in detail with patient  Mild persistent asthma Uses Breztri and albuterol as needed  Patient instructed to use his albuterol now as he is wheezing Patient with environmental exposure to cats   MEDICATION ADJUSTMENTS/LABS AND TESTS ORDERED: Recommend obtaining Home Sleep Study to establish diagnosis We will provide information to contact provider for assessment for Inspire Device  Use your Albuterol inhaler as needed  Avoid secondhand smoke Avoid SICK contacts Recommend  Masking  when appropriate Recommend Keep up-to-date with vaccinations  Follow up with Dr Fidela Juneau as scheduled   CURRENT MEDICATIONS REVIEWED AT LENGTH WITH PATIENT TODAY   Patient  satisfied with Plan of action and management. All questions answered  Follow up  3 months  Total Time Spent  47 mins   Lucie Leather, M.D.  [facility]  Medical Director [facility] Medical Director [facility]

## 2023-01-13 NOTE — Patient Instructions (Addendum)
Recommend obtaining Home Sleep Study to establish diagnosis We will provide information to contact provider for assessment for Inspire Device  Use your Albuterol inhaler as needed  Avoid secondhand smoke Avoid SICK contacts Recommend  Masking  when appropriate Recommend Keep up-to-date with vaccinations  Follow up with Dr Fidela Juneau as scheduled

## 2023-01-22 ENCOUNTER — Encounter: Payer: 59 | Admitting: Internal Medicine

## 2023-01-22 DIAGNOSIS — G4733 Obstructive sleep apnea (adult) (pediatric): Secondary | ICD-10-CM

## 2023-01-22 DIAGNOSIS — G4730 Sleep apnea, unspecified: Secondary | ICD-10-CM | POA: Diagnosis not present

## 2023-02-02 NOTE — Patient Instructions (Incomplete)

## 2023-02-02 NOTE — Progress Notes (Unsigned)
PROVIDER NOTE: Information contained herein reflects review and annotations entered in association with encounter. Interpretation of such information and data should be left to medically-trained personnel. Information provided to patient can be located elsewhere in the medical record under "Patient Instructions". Document created using STT-dictation technology, any transcriptional errors that may result from process are unintentional.    Patient: [name]  Service Category: E/M  Provider: Oswaldo Done, MD  DOB: 1960-12-31  DOS: 02/03/2023  Referring Provider: Dorcas Carrow, DO  MRN: [id number]  Specialty: Interventional Pain Management  PCP: Dorcas Carrow, DO  Type: Established Patient  Setting: Ambulatory outpatient    Location: Office  Delivery: Face-to-face     HPI  Mr. BRENNIN HOLLOMAN, a 62 y.o. year old male, is here today because of his No primary diagnosis found.. Mr. Saed primary complain today is No chief complaint on file.  Pertinent problems: Mr. Kirley has Chronic low back pain (1ry area of Pain) (Bilateral) (L>R); Lumbar spondylosis; Chronic lumbar radicular pain (S1 dermatomal) (Left); 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); Chronic neck pain (posterior midline) (Bilateral) (L>R); Cervical spondylosis; Chronic cervical radicular pain (Bilateral) (C5/C6 dermatome) (L>R); Cervical spinal stenosis (C4-5); Cervical foraminal stenosis (Bilateral C5-6); Retrolisthesis of L5-S1; Cervical disc herniation (C4-5 and C5-6); Lumbar disc herniation (L5-S1); Chronic sacroiliac joint pain (Bilateral) (L>R); Chronic hip pain (Left); Lumbar facet syndrome (Bilateral) (L>R); Chronic lower extremity pain (2ry area of Pain) (Left); Greater occipital neuralgia (Right); Chronic pain syndrome; Migraine; Musculoskeletal pain, chronic; Left-sided weakness; Hip arthritis; S/P hip replacement; Squamous cell carcinoma of left lung (HCC); Chronic upper extremity pain  (3ry area of Pain) (Bilateral) (L>R); Cancer associated pain; Malignant neoplasm of lower lobe of left lung (HCC); and History of lower lobectomy of lung (Left) on their pertinent problem list. Pain Assessment: Severity of   is reported as a  /10. Location:    / . Onset:  . Quality:  . Timing:  . Modifying factor(s):  [name] Vitals:  vitals were not taken for this visit.  BMI: Estimated body mass index is 34.8 kg/m as calculated from the following:   Height as of 01/13/23: 5\' 6"  (1.676 m).   Weight as of 01/13/23: 215 lb 9.6 oz (97.8 kg). Last encounter: 11/06/2022. Last procedure: Visit date not found.  Reason for encounter: medication management. *** Discussed the use of AI scribe software for clinical note transcription with the patient, who gave verbal consent to proceed.  History of Present Illness         05/10/2023   Pharmacotherapy Assessment  Analgesic: Morphine IR 15 mg, 1 tablet p.o. every 6 hours (60 mg/day of morphine) (60 MME) + Compounded Specialty Analgesic Cream (w/ Ketamine) MME/day: 60 mg/day.   Monitoring: [place] PMP: PDMP reviewed during this encounter.       Pharmacotherapy: No side-effects or adverse reactions reported. Compliance: No problems identified. Effectiveness: Clinically acceptable.  No notes on file  No results found for: "CBDTHCR" No results found for: "D8THCCBX" No results found for: "D9THCCBX"  UDS:  Summary  Date Value Ref Range Status  05/08/2022 Note  Final    Comment:    ==================================================================== [employer] Select 13 (MW) ==================================================================== Test                             Result       Flag       Units  Drug Present not Declared for Prescription Verification  Alpha-hydroxymidazolam         347          UNEXPECTED ng/mg creat    Alpha-hydroxymidazolam is an expected metabolite of midazolam.    Source of midazolam is a scheduled prescription  medication.    Morphine                       8921         UNEXPECTED ng/mg creat    A moderate to large amount of morphine is present; the metabolites    normorphine and hydromorphone are not present. This is an atypical    result. Although patients with unusual metabolic profiles exist,    they are rare. Review of previous drug screen results or collection    of a urine sample several hours after a WITNESSED dose of the drug    may help to clarify the subject's ability to produce metabolite.     Potential sources of large amounts of morphine in the absence of    codeine include administration of morphine or use of heroin.  ==================================================================== Test                      Result    Flag   Units      Ref Range   Creatinine              73               mg/dL      >=56 ==================================================================== Declared Medications:  The flagging and interpretation on this report are based on the  following declared medications.  Unexpected results may arise from  inaccuracies in the declared medications. ==================================================================== For clinical consultation, please call [phone]. ====================================================================       ROS  Constitutional: Denies any fever or chills Gastrointestinal: No reported hemesis, hematochezia, vomiting, or acute GI distress Musculoskeletal: Denies any acute onset joint swelling, redness, loss of ROM, or weakness Neurological: No reported episodes of acute onset apraxia, aphasia, dysarthria, agnosia, amnesia, paralysis, loss of coordination, or loss of consciousness  Medication Review  Accu-Chek FastClix Lancets, Budeson-Glycopyrrol-Formoterol, Potassium Chloride ER, SUMAtriptan, Semaglutide, Tiotropium Bromide-Olodaterol, albuterol, amLODipine, atorvastatin, diphenhydrAMINE, empagliflozin, esomeprazole,  glucose blood, hydrALAZINE, lidocaine, metFORMIN, metoprolol succinate, metoprolol tartrate, montelukast, morphine, multivitamin with minerals, naloxone, and nortriptyline  History Review  Allergy: Mr. Vaziri is allergic to acetaminophen, aspirin, epinephrine, novocain [procaine], penicillins, strawberry extract, and shellfish allergy. Drug: Mr. Swasey  reports no history of drug use. Alcohol:  reports no history of alcohol use. Tobacco:  reports that he quit smoking about 4 years ago. His smoking use included cigarettes. He started smoking about 39 years ago. He has a 8.8 pack-year smoking history. He has never used smokeless tobacco. Social: Mr. Portee  reports that he quit smoking about 4 years ago. His smoking use included cigarettes. He started smoking about 39 years ago. He has a 8.8 pack-year smoking history. He has never used smokeless tobacco. He reports that he does not drink alcohol and does not use drugs. Medical:  has a past medical history of Allergy, Arthritis, Benign hypertensive kidney disease, Chronic back pain, Diabetes mellitus, type 2 (HCC), Dyspnea, GERD (gastroesophageal reflux disease), Hypertension, Malignant neoplasm of lung (HCC), and Migraines. Surgical: Mr. Shia  has a past surgical history that includes Back surgery; Appendectomy; Leg Surgery; Knee surgery (Left); Foot surgery (Left); Colonoscopy with propofol (N/A, 02/19/2016); polypectomy (N/A, 02/19/2016); Total hip arthroplasty (Left, 12/02/2017); Colonoscopy  with propofol (N/A, 01/19/2018); polypectomy (01/19/2018); DG OPERATIVE LEFT HIP ([facility] HX); Joint replacement (Left, 12/2017); Video bronchoscopy (Left, 01/14/2019); Thoracotomy (Left, 01/14/2019); Flexible bronchoscopy (Bilateral, 01/20/2019); Flexible bronchoscopy (Bilateral, 01/22/2019); Electormagnetic navigation bronchoscopy (Left, 11/18/2018); Lung cancer surgery; Video bronchoscopy with endobronchial ultrasound (N/A, 10/15/2019); Video bronchoscopy with  endobronchial navigation (N/A, 10/15/2019); Portacath placement (N/A, 11/11/2019); Colonoscopy with propofol (N/A, 03/16/2021); and Colonoscopy with propofol (N/A, 04/06/2021). Family: family history includes Cancer in his father; Diabetes in his sister; Thrombosis in his sister.  Laboratory Chemistry Profile   Renal Lab Results  Component Value Date   BUN 6 (L) 09/03/2022   CREATININE 0.80 11/14/2022   BCR 7 (L) 09/03/2022   GFRAA >60 12/01/2019   GFRNONAA >60 07/05/2021    Hepatic Lab Results  Component Value Date   AST 27 09/03/2022   ALT 28 09/03/2022   ALBUMIN 4.6 09/03/2022   ALKPHOS 161 (H) 09/03/2022   LIPASE 50 02/17/2019    Electrolytes Lab Results  Component Value Date   NA 141 09/03/2022   K 3.6 09/03/2022   CL 102 09/03/2022   CALCIUM 9.1 09/03/2022   MG 2.4 01/25/2019   PHOS 3.5 01/23/2019    Bone No results found for: "VD25OH", "VD125OH2TOT", "QV9563OV5", "IE3329JJ8", "25OHVITD1", "25OHVITD2", "25OHVITD3", "TESTOFREE", "TESTOSTERONE"  Inflammation (CRP: Acute Phase) (ESR: Chronic Phase) Lab Results  Component Value Date   CRP 0.7 04/03/2015   ESRSEDRATE 11 10/21/2017   LATICACIDVEN 1.7 01/16/2019         Note: Above Lab results reviewed.  Recent Imaging Review  CT Chest W Contrast CLINICAL DATA:  Follow-up lung cancer. LEFT upper lobe lobectomy November 2020. Post chemo radiation therapy 2021. * Tracking Code: BO *  EXAM: CT CHEST WITH CONTRAST  TECHNIQUE: Multidetector CT imaging of the chest was performed during intravenous contrast administration.  RADIATION DOSE REDUCTION: This exam was performed according to the departmental dose-optimization program which includes automated exposure control, adjustment of the mA and/or kV according to patient size and/or use of iterative reconstruction technique.  CONTRAST:  75mL OMNIPAQUE IOHEXOL 300 MG/ML  SOLN  COMPARISON:  Chest CT 05/08/2022  FINDINGS: Cardiovascular: No significant vascular  findings. Normal heart size. No pericardial effusion.  Mediastinum/Nodes: No axillary or supraclavicular adenopathy. No mediastinal or hilar adenopathy. No pericardial fluid. Esophagus normal.  Lungs/Pleura: LEFT upper lobectomy anatomy. Mild consolidation about the LEFT hilum with air bronchograms typical of radiation change. No LEFT lung nodularity.  Subpleural nodule in the RIGHT upper lobe posteriorly measures 5 mm (image 81/3) not changed from prior.  Upper Abdomen: Limited view of the liver, kidneys, pancreas are unremarkable. Normal adrenal glands.  Musculoskeletal: No aggressive osseous lesion.  IMPRESSION: 1. LEFT upper lobectomy anatomy. No evidence of local tumor recurrence. 2. Stable radiation change about the LEFT hilum. 3. Stable RIGHT upper lobe pulmonary nodule. 4. No evidence of metastatic adenopathy.  Electronically Signed   By: Genevive Bi M.D.   On: 11/27/2022 10:39 Note: Reviewed        Physical Exam  General appearance: Well nourished, well developed, and well hydrated. In no apparent acute distress Mental status: Alert, oriented x 3 (person, place, & time)       Respiratory: No evidence of acute respiratory distress Eyes: PERLA Vitals: There were no vitals taken for this visit. BMI: Estimated body mass index is 34.8 kg/m as calculated from the following:   Height as of 01/13/23: 5\' 6"  (1.676 m).   Weight as of 01/13/23: 215 lb 9.6 oz (97.8 kg). Ideal:  Patient weight not recorded  Assessment   Diagnosis Status  1. Chronic low back pain (1ry area of Pain) (Bilateral) (L>R)   2. Chronic lower extremity pain (2ry area of Pain) (Left)   3. Chronic upper extremity pain (3ry area of Pain) (Bilateral) (L>R)   4. 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)   5. Squamous cell carcinoma of left lung (HCC)   6. Chronic pain syndrome   7. Pharmacologic therapy   8. Chronic use of opiate for therapeutic purpose   9. Encounter  for medication management   10. Encounter for chronic pain management   11. Cancer associated pain   12. Malignant neoplasm of lower lobe of left lung (HCC)    Controlled Controlled Controlled   Updated Problems: No problems updated.  Plan of Care  Problem-specific:  Assessment and Plan            Mr. THOMAS BUITRAGO has a current medication list which includes the following long-term medication(s): albuterol, albuterol, amlodipine, atorvastatin, esomeprazole, hydralazine, metformin, metoprolol succinate, metoprolol tartrate, montelukast, morphine, nortriptyline, potassium chloride er, and sumatriptan.  Pharmacotherapy (Medications Ordered): No orders of the defined types were placed in this encounter.  Orders:  No orders of the defined types were placed in this encounter.  Follow-up plan:   No follow-ups on file.      Interventional Therapies  Risk Factors  Complex Considerations:   High Risk for SUD.    Planned  Pending:   None at this time. Claims to have anaphylactic allergy to Local Anesthetics. He was sent for testing and shown/proven not to be allergic to Lidocaine.    Under consideration:   None   Completed:   We have now called his compounding pharmacy twice, one in 2022 and on 08/13/2021 and they have confirmed that they do on her day 1 year refill prescription for his nonformulary cream.  The last prescription was sent to them on 07/31/2021 and should be good until 07/31/2022.  They have confirmed on 08/13/2021 that they are in position of this prescription. On the patient's initial evaluation he claims to have an anaphylactic allergy to lidocaine.  The patient was sent to [facility] dermatology where he was skin tested and found to have absolutely no allergies to lidocaine.  Neither ropivacaine (Naropin) nor bupivacaine (Sensorcaine) were tested.   Therapeutic  Palliative (PRN) options:   None.   Pharmacotherapy  Compounded medication was transferred on  02/06/2022.  At that time, he still had enough refills to last until 07/31/2022. Compounding pharmacy honors 1 year scripts.          Recent Visits Date Type Provider Dept  11/06/22 Office Visit Delano Metz, MD [facility]  Showing recent visits within past 90 days and meeting all other requirements Future Appointments Date Type Provider Dept  02/03/23 Appointment Delano Metz, MD [facility]  Showing future appointments within next 90 days and meeting all other requirements  I discussed the assessment and treatment plan with the patient. The patient was provided an opportunity to ask questions and all were answered. The patient agreed with the plan and demonstrated an understanding of the instructions.  Patient advised to call back or seek an in-person evaluation if the symptoms or condition worsens.  Duration of encounter: *** minutes.  Total time on encounter, as per AMA guidelines included both the face-to-face and non-face-to-face time personally spent by the physician and/or other qualified health care professional(s) on the day of the encounter (  includes time in activities that require the physician or other qualified health care professional and does not include time in activities normally performed by clinical staff). Physician's time may include the following activities when performed: Preparing to see the patient (e.g., pre-charting review of records, searching for previously ordered imaging, lab work, and nerve conduction tests) Review of prior analgesic pharmacotherapies. Reviewing PMP Interpreting ordered tests (e.g., lab work, imaging, nerve conduction tests) Performing post-procedure evaluations, including interpretation of diagnostic procedures Obtaining and/or reviewing separately obtained history Performing a medically appropriate examination and/or evaluation Counseling and educating the patient/family/caregiver Ordering medications,  tests, or procedures Referring and communicating with other health care professionals (when not separately reported) Documenting clinical information in the electronic or other health record Independently interpreting results (not separately reported) and communicating results to the patient/ family/caregiver Care coordination (not separately reported)  Note by: Oswaldo Done, MD Date: 02/03/2023; Time: 4:49 PM

## 2023-02-03 ENCOUNTER — Ambulatory Visit: Payer: 59 | Attending: Pain Medicine | Admitting: Pain Medicine

## 2023-02-03 DIAGNOSIS — M79601 Pain in right arm: Secondary | ICD-10-CM | POA: Insufficient documentation

## 2023-02-03 DIAGNOSIS — M545 Low back pain, unspecified: Secondary | ICD-10-CM

## 2023-02-03 DIAGNOSIS — C3492 Malignant neoplasm of unspecified part of left bronchus or lung: Secondary | ICD-10-CM | POA: Insufficient documentation

## 2023-02-03 DIAGNOSIS — M79602 Pain in left arm: Secondary | ICD-10-CM | POA: Diagnosis not present

## 2023-02-03 DIAGNOSIS — Z79891 Long term (current) use of opiate analgesic: Secondary | ICD-10-CM | POA: Diagnosis not present

## 2023-02-03 DIAGNOSIS — Z79899 Other long term (current) drug therapy: Secondary | ICD-10-CM | POA: Diagnosis not present

## 2023-02-03 DIAGNOSIS — M5442 Lumbago with sciatica, left side: Secondary | ICD-10-CM | POA: Diagnosis not present

## 2023-02-03 DIAGNOSIS — G8929 Other chronic pain: Secondary | ICD-10-CM | POA: Insufficient documentation

## 2023-02-03 DIAGNOSIS — C3432 Malignant neoplasm of lower lobe, left bronchus or lung: Secondary | ICD-10-CM | POA: Diagnosis not present

## 2023-02-03 DIAGNOSIS — M5441 Lumbago with sciatica, right side: Secondary | ICD-10-CM | POA: Diagnosis not present

## 2023-02-03 DIAGNOSIS — M961 Postlaminectomy syndrome, not elsewhere classified: Secondary | ICD-10-CM | POA: Insufficient documentation

## 2023-02-03 DIAGNOSIS — M79605 Pain in left leg: Secondary | ICD-10-CM | POA: Insufficient documentation

## 2023-02-03 DIAGNOSIS — G894 Chronic pain syndrome: Secondary | ICD-10-CM | POA: Insufficient documentation

## 2023-02-03 DIAGNOSIS — G893 Neoplasm related pain (acute) (chronic): Secondary | ICD-10-CM | POA: Insufficient documentation

## 2023-02-03 MED ORDER — MORPHINE SULFATE 15 MG PO TABS: 15.0000 mg | ORAL_TABLET | Freq: Four times a day (QID) | ORAL | 0 refills | Status: DC | PRN

## 2023-02-03 MED ORDER — NALOXONE HCL 4 MG/0.1ML NA LIQD: 1.0000 | NASAL | 0 refills | Status: DC | PRN

## 2023-02-03 NOTE — Progress Notes (Signed)
Nursing Pain Medication Assessment:  Safety precautions to be maintained throughout the outpatient stay will include: orient to surroundings, keep bed in low position, maintain call bell within reach at all times, provide assistance with transfer out of bed and ambulation.  Medication Inspection Compliance: Pill count conducted under aseptic conditions, in front of the patient. Neither the pills nor the bottle was removed from the patient's sight at any time. Once count was completed pills were immediately returned to the patient in their original bottle.  Medication: Morphine IR Pill/Patch Count: 71 pills out of 120 Pill/Patch Appearance: Markings consistent with prescribed medication Bottle Appearance: Standard pharmacy container. Clearly labeled. Filled Date: 1 / 20 / 2024 Last Medication intake:  Today

## 2023-03-01 ENCOUNTER — Other Ambulatory Visit: Payer: Self-pay | Admitting: Family Medicine

## 2023-03-02 ENCOUNTER — Other Ambulatory Visit: Payer: Self-pay | Admitting: Family Medicine

## 2023-03-06 NOTE — Telephone Encounter (Signed)
 Requested Prescriptions  Pending Prescriptions Disp Refills   Potassium Chloride  ER 20 MEQ TBCR [Pharmacy Med Name: POTASSIUM CHLORIDE  ER TABLETS] 90 tablet 1    Sig: TAKE 1 TABLET(20 MEQ) BY MOUTH DAILY     Endocrinology:  Minerals - Potassium Supplementation Passed - 03/06/2023 12:40 PM      Passed - K in normal range and within 360 days    Potassium  Date Value Ref Range Status  09/03/2022 3.6 3.5 - 5.2 mmol/L Final         Passed - Cr in normal range and within 360 days    Creatinine, Ser  Date Value Ref Range Status  11/14/2022 0.80 0.61 - 1.24 mg/dL Final         Passed - Valid encounter within last 12 months    Recent Outpatient Visits           3 months ago Type 2 diabetes mellitus with stage 1 chronic kidney disease, without long-term current use of insulin  (HCC)   [place] [facility] [place], [name], DO   6 months ago Aortic atherosclerosis (HCC)   [place] [facility] [place], [name], DO   8 months ago Type 2 diabetes mellitus with stage 1 chronic kidney disease, without long-term current use of insulin  (HCC)   [place] [facility] [place], [name], DO   11 months ago Type 2 diabetes mellitus with stage 1 chronic kidney disease, without long-term current use of insulin  [facility])   [place] [facility] [place], [name], DO   1 year ago Chronic pain syndrome   [place] [facility] [place], Duwaine SQUIBB, DO       Future Appointments             Tomorrow Vicci Duwaine SQUIBB, DO [place] [name]

## 2023-03-06 NOTE — Telephone Encounter (Signed)
 Requested Prescriptions  Pending Prescriptions Disp Refills   montelukast  (SINGULAIR ) 10 MG tablet [Pharmacy Med Name: MONTELUKAST  10MG  TABLETS] 90 tablet 1    Sig: TAKE 1 TABLET(10 MG) BY MOUTH DAILY     Pulmonology:  Leukotriene Inhibitors Passed - 03/06/2023  2:07 PM      Passed - Valid encounter within last 12 months    Recent Outpatient Visits           3 months ago Type 2 diabetes mellitus with stage 1 chronic kidney disease, without long-term current use of insulin  (HCC)   [place] [facility] [place], [name], DO   6 months ago Aortic atherosclerosis (HCC)   [place] [facility] [place], [name], DO   8 months ago Type 2 diabetes mellitus with stage 1 chronic kidney disease, without long-term current use of insulin  (HCC)   [place] [facility] [place], [name], DO   11 months ago Type 2 diabetes mellitus with stage 1 chronic kidney disease, without long-term current use of insulin  (HCC)   [place] [facility] [place], [name], DO   1 year ago Chronic pain syndrome   [place] [facility] [place], Duwaine SQUIBB, DO       Future Appointments             Tomorrow Vicci Duwaine SQUIBB, DO [place] [facility], [name]

## 2023-03-07 ENCOUNTER — Ambulatory Visit: Payer: 59 | Admitting: Family Medicine

## 2023-03-07 ENCOUNTER — Encounter: Payer: Self-pay | Admitting: Family Medicine

## 2023-03-07 VITALS — BP 138/82 | HR 67 | Wt 218.8 lb

## 2023-03-07 DIAGNOSIS — E1169 Type 2 diabetes mellitus with other specified complication: Secondary | ICD-10-CM | POA: Diagnosis not present

## 2023-03-07 DIAGNOSIS — J439 Emphysema, unspecified: Secondary | ICD-10-CM

## 2023-03-07 DIAGNOSIS — I129 Hypertensive chronic kidney disease with stage 1 through stage 4 chronic kidney disease, or unspecified chronic kidney disease: Secondary | ICD-10-CM | POA: Diagnosis not present

## 2023-03-07 DIAGNOSIS — Z0000 Encounter for general adult medical examination without abnormal findings: Secondary | ICD-10-CM | POA: Diagnosis not present

## 2023-03-07 DIAGNOSIS — R3911 Hesitancy of micturition: Secondary | ICD-10-CM

## 2023-03-07 DIAGNOSIS — E1122 Type 2 diabetes mellitus with diabetic chronic kidney disease: Secondary | ICD-10-CM | POA: Diagnosis not present

## 2023-03-07 DIAGNOSIS — N181 Chronic kidney disease, stage 1: Secondary | ICD-10-CM | POA: Diagnosis not present

## 2023-03-07 DIAGNOSIS — E785 Hyperlipidemia, unspecified: Secondary | ICD-10-CM

## 2023-03-07 DIAGNOSIS — C3492 Malignant neoplasm of unspecified part of left bronchus or lung: Secondary | ICD-10-CM

## 2023-03-07 DIAGNOSIS — I700 Atherosclerosis of aorta: Secondary | ICD-10-CM

## 2023-03-07 DIAGNOSIS — Z7984 Long term (current) use of oral hypoglycemic drugs: Secondary | ICD-10-CM | POA: Diagnosis not present

## 2023-03-07 LAB — MICROALBUMIN, URINE WAIVED
Creatinine, Urine Waived: 50 mg/dL (ref 10–300)
Microalb, Ur Waived: 80 mg/L — ABNORMAL HIGH (ref 0–19)
Microalb/Creat Ratio: >300 mg/g — ABNORMAL HIGH (ref <30)

## 2023-03-07 LAB — BAYER DCA HB A1C WAIVED: HB A1C (BAYER DCA - WAIVED): 6.3 % — ABNORMAL HIGH (ref 4.8–5.6)

## 2023-03-07 MED ORDER — RYBELSUS 14 MG PO TABS: 14.0000 mg | ORAL_TABLET | Freq: Every day | ORAL | 1 refills | Status: DC

## 2023-03-07 MED ORDER — AMLODIPINE BESYLATE 5 MG PO TABS: ORAL_TABLET | ORAL | 1 refills | Status: DC

## 2023-03-07 MED ORDER — HYDRALAZINE HCL 100 MG PO TABS: 100.0000 mg | ORAL_TABLET | Freq: Two times a day (BID) | ORAL | 1 refills | Status: DC

## 2023-03-07 MED ORDER — BREZTRI AEROSPHERE 160-9-4.8 MCG/ACT IN AERO: 2.0000 | INHALATION_SPRAY | Freq: Two times a day (BID) | RESPIRATORY_TRACT | 6 refills | Status: DC

## 2023-03-07 MED ORDER — NORTRIPTYLINE HCL 25 MG PO CAPS: ORAL_CAPSULE | ORAL | 1 refills | Status: DC

## 2023-03-07 MED ORDER — ESOMEPRAZOLE MAGNESIUM 40 MG PO CPDR: DELAYED_RELEASE_CAPSULE | ORAL | 3 refills | Status: AC

## 2023-03-07 MED ORDER — POTASSIUM CHLORIDE ER 20 MEQ PO TBCR: EXTENDED_RELEASE_TABLET | ORAL | 1 refills | Status: DC

## 2023-03-07 MED ORDER — METOPROLOL TARTRATE 50 MG PO TABS: ORAL_TABLET | ORAL | 1 refills | Status: DC

## 2023-03-07 MED ORDER — ALBUTEROL SULFATE (2.5 MG/3ML) 0.083% IN NEBU: INHALATION_SOLUTION | RESPIRATORY_TRACT | 1 refills | Status: DC

## 2023-03-07 MED ORDER — MONTELUKAST SODIUM 10 MG PO TABS: ORAL_TABLET | ORAL | 1 refills | Status: DC

## 2023-03-07 MED ORDER — METFORMIN HCL 500 MG PO TABS: ORAL_TABLET | ORAL | 1 refills | Status: DC

## 2023-03-07 MED ORDER — EMPAGLIFLOZIN 25 MG PO TABS: ORAL_TABLET | ORAL | 1 refills | Status: DC

## 2023-03-07 MED ORDER — METOPROLOL SUCCINATE ER 100 MG PO TB24: 100.0000 mg | ORAL_TABLET | Freq: Two times a day (BID) | ORAL | 1 refills | Status: DC

## 2023-03-07 MED ORDER — ATORVASTATIN CALCIUM 10 MG PO TABS: 10.0000 mg | ORAL_TABLET | Freq: Every day | ORAL | 1 refills | Status: DC

## 2023-03-07 NOTE — Assessment & Plan Note (Signed)
 Under good control on current regimen. Continue current regimen. Continue to monitor. Call with any concerns. Refills given. Labs drawn today.

## 2023-03-07 NOTE — Assessment & Plan Note (Signed)
 Doing great with A1c of 6.3! Continue current regimen. Call with any concerns. Recheck 6 months.

## 2023-03-07 NOTE — Assessment & Plan Note (Signed)
Will keep BP and cholesterol and sugar under good control. Continue to monitor.

## 2023-03-07 NOTE — Progress Notes (Signed)
 BP 138/82   Pulse 67   Wt 218 lb 12.8 oz (99.2 kg)   SpO2 98%   BMI 31.39 kg/m    Subjective:    Patient ID: Kristopher [name], male    DOB: Aug 17, 1960, 63 y.o.   MRN: [id number]  HPI: Kristopher [name] is a 63 y.o. male presenting on 03/07/2023 for comprehensive medical examination. Current medical complaints include:  HYPERTENSION / HYPERLIPIDEMIA Satisfied with current treatment? yes Duration of hypertension: chronic BP monitoring frequency: not checking BP medication side effects: no Past BP meds: amlodipine , hydralazine , metoprolol ,  Duration of hyperlipidemia: chronic Cholesterol medication side effects: no Cholesterol supplements: none Past cholesterol medications: atorvastatin  Medication compliance: excellent compliance Aspirin: no Recent stressors: no Recurrent headaches: no Visual changes: no Palpitations: no Dyspnea: no Chest pain: no Lower extremity edema: no Dizzy/lightheaded: no  DIABETES Hypoglycemic episodes:no Polydipsia/polyuria: no Visual disturbance: no Chest pain: no Paresthesias: no Glucose Monitoring: yes Taking Insulin ?: no Blood Pressure Monitoring: not checking Retinal Examination: Up to Date Foot Exam: Up to Date Diabetic Education: Completed Pneumovax: Not up to Date Influenza: Not up to Date Aspirin: no  He currently lives with: wife Interim Problems from his last visit: no  Depression Screen done today and results listed below:     02/03/2023    8:59 AM 09/03/2022    9:41 AM 08/27/2022    8:27 AM 08/07/2022    8:22 AM 11/12/2021    8:01 AM  Depression screen PHQ 2/9  Decreased Interest 0 0 0 0 0  Down, Depressed, Hopeless 0 0 0 0 0  PHQ - 2 Score 0 0 0 0 0  Altered sleeping  0 0    Tired, decreased energy  0 0    Change in appetite  0 0    Feeling bad or failure about yourself   0 0    Trouble concentrating  0 0    Moving slowly or fidgety/restless  0 0    Suicidal thoughts  0 0    PHQ-9 Score  0 0    Difficult doing  work/chores  Not difficult at all Not difficult at all      Past Medical History:  Past Medical History:  Diagnosis Date   Allergy    Arthritis    left foot   Benign hypertensive kidney disease    Chronic back pain    Four rods in back   Diabetes mellitus, type 2 (HCC)    Dyspnea    GERD (gastroesophageal reflux disease)    Hypertension    Malignant neoplasm of lung (HCC)    Migraines    daily    Surgical History:  Past Surgical History:  Procedure Laterality Date   APPENDECTOMY     BACK SURGERY     COLONOSCOPY WITH PROPOFOL  N/A 02/19/2016   Procedure: COLONOSCOPY WITH PROPOFOL ;  Surgeon: Rogelia Copping, MD;  Location: [facility];  Service: Endoscopy;  Laterality: N/A;   COLONOSCOPY WITH PROPOFOL  N/A 01/19/2018   Procedure: COLONOSCOPY WITH PROPOFOL ;  Surgeon: Copping Rogelia, MD;  Location: [facility];  Service: Endoscopy;  Laterality: N/A;  Diabetic - oral meds   COLONOSCOPY WITH PROPOFOL  N/A 03/16/2021   Procedure: COLONOSCOPY WITH PROPOFOL ;  Surgeon: Copping Rogelia, MD;  Location: [facility];  Service: Endoscopy;  Laterality: N/A;  INADEQUATE PREP   COLONOSCOPY WITH PROPOFOL  N/A 04/06/2021   Procedure: COLONOSCOPY WITH PROPOFOL ;  Surgeon: Copping Rogelia, MD;  Location: [facility];  Service: Endoscopy;  Laterality:  N/A;   DG OPERATIVE LEFT HIP ([facility] HX)     10/19   ELECTROMAGNETIC NAVIGATION BROCHOSCOPY Left 11/18/2018   Procedure: ELECTROMAGNETIC NAVIGATION BRONCHOSCOPY;  Surgeon: Tamea Dedra CROME, MD;  Location: [facility] ORS;  Service: Cardiopulmonary;  Laterality: Left;   FLEXIBLE BRONCHOSCOPY Bilateral 01/20/2019   Procedure: FLEXIBLE BRONCHOSCOPY;  Surgeon: Parris Manna, MD;  Location: [facility] OR[facility];  Service: Thoracic;  Laterality: Bilateral;   FLEXIBLE BRONCHOSCOPY Bilateral 01/22/2019   Procedure: FLEXIBLE BRONCHOSCOPY;  Surgeon: Parris Manna, MD;  Location: [facility];  Service: Thoracic;  Laterality: Bilateral;   FOOT SURGERY Left    Screws  and plates   JOINT REPLACEMENT Left 12/2017   DR Kathlynn Hip   KNEE SURGERY Left    X 2   LEG SURGERY     LUNG CANCER SURGERY     POLYPECTOMY N/A 02/19/2016   Procedure: POLYPECTOMY;  Surgeon: Rogelia Copping, MD;  Location: [facility];  Service: Endoscopy;  Laterality: N/A;   POLYPECTOMY  01/19/2018   Procedure: POLYPECTOMY;  Surgeon: Copping Rogelia, MD;  Location: [facility];  Service: Endoscopy;;   PORTACATH PLACEMENT N/A 11/11/2019   Procedure: INSERTION PORT-A-CATH;  Surgeon: Volney Lye, MD;  Location: [facility] OR[facility];  Service: General;  Laterality: N/A;   THORACOTOMY Left 01/14/2019   Procedure: THORACOTOMY MAJOR, LEFT;  Surgeon: Volney Lye, MD;  Location: [facility];  Service: General;  Laterality: Left;   TOTAL HIP ARTHROPLASTY Left 12/02/2017   Procedure: TOTAL HIP ARTHROPLASTY ANTERIOR APPROACH;  Surgeon: Kathlynn Sharper, MD;  Location: [facility];  Service: Orthopedics;  Laterality: Left;   VIDEO BRONCHOSCOPY Left 01/14/2019   Procedure: VIDEO BRONCHOSCOPY WITH FLUORO, LEFT;  Surgeon: Volney Lye, MD;  Location: [facility];  Service: General;  Laterality: Left;   VIDEO BRONCHOSCOPY WITH ENDOBRONCHIAL NAVIGATION N/A 10/15/2019   Procedure: VIDEO BRONCHOSCOPY WITH ENDOBRONCHIAL NAVIGATION;  Surgeon: Parris Manna, MD;  Location: [facility];  Service: Thoracic;  Laterality: N/A;   VIDEO BRONCHOSCOPY WITH ENDOBRONCHIAL ULTRASOUND N/A 10/15/2019   Procedure: VIDEO BRONCHOSCOPY WITH ENDOBRONCHIAL ULTRASOUND;  Surgeon: Parris Manna, MD;  Location: [facility];  Service: Thoracic;  Laterality: N/A;    Medications:  Current Outpatient Medications on File Prior to Visit  Medication Sig   Accu-Chek FastClix Lancets MISC USE TO TEST BLOOD SUGAR 2X A DAY   albuterol  (VENTOLIN  HFA) 108 (90 Base) MCG/ACT inhaler Inhale into the lungs.   diphenhydrAMINE  (BENADRYL ) 25 MG tablet Take 75 mg by mouth in the morning and at bedtime.   glucose blood (ACCU-CHEK GUIDE) test strip USE TO TEST BLOOD  SUGAR 2X A DAY   lidocaine  (LIDODERM ) 5 % UNWRAP AND APPLY 1 PATCH TOPICALLY ONTO THE SKIN DAILY. REMOVE AND DISCARD PATCH WITHIN 12 HOURS OR AS DIRECTED   morphine  (MSIR) 15 MG tablet Take 1 tablet (15 mg total) by mouth every 6 (six) hours as needed for moderate pain (pain score 4-6) or severe pain (pain score 7-10). Must last 30 days.   [START ON 03/11/2023] morphine  (MSIR) 15 MG tablet Take 1 tablet (15 mg total) by mouth every 6 (six) hours as needed for moderate pain (pain score 4-6) or severe pain (pain score 7-10). Must last 30 days.   [START ON 04/10/2023] morphine  (MSIR) 15 MG tablet Take 1 tablet (15 mg total) by mouth every 6 (six) hours as needed for moderate pain (pain score 4-6) or severe pain (pain score 7-10). Must last 30 days.   Multiple Vitamin (MULTIVITAMIN WITH MINERALS) TABS tablet Take 1 tablet by mouth daily.  naloxone  (NARCAN ) nasal spray 4 mg/0.1 mL Place 1 spray into the nose as needed for up to 365 doses (for opioid-induced respiratory depresssion). In case of emergency (overdose), spray once into each nostril. If no response within 3 minutes, repeat application and call 911.   STIOLTO RESPIMAT  2.5-2.5 MCG/ACT AERS Inhale 2 puffs into the lungs 4 (four) times daily as needed. Prn per pulmonology notes   SUMAtriptan  (IMITREX ) 50 MG tablet Take 1 tablet (50 mg total) by mouth every 2 (two) hours as needed for migraine. Take 1 tab at onset of migraine. May repeat in 2 hours if headache persists or recurs.   Current Facility-Administered Medications on File Prior to Visit  Medication   sodium chloride  flush (NS) 0.9 % injection 10 mL    Allergies:  Allergies  Allergen Reactions   Acetaminophen  Swelling   Aspirin Anaphylaxis   Epinephrine Anaphylaxis    Does not include albuterol    Novocain [Procaine] Anaphylaxis   Penicillins Anaphylaxis    Has patient had a PCN reaction causing immediate rash, facial/tongue/throat swelling, SOB or lightheadedness with hypotension:  Yes Has patient had a PCN reaction causing severe rash involving mucus membranes or skin necrosis: No Has patient had a PCN reaction that required hospitalization: Yes Has patient had a PCN reaction occurring within the last 10 years: No If all of the above answers are NO, then may proceed with Cephalosporin use.    Strawberry Extract Anaphylaxis   Shellfish Allergy Hives and Nausea And Vomiting    Social History:  Social History   Socioeconomic History   Marital status: Significant Other    Spouse name: Not on file   Number of children: Not on file   Years of education: Not on file   Highest education level: Not on file  Occupational History   Occupation: disability  Tobacco Use   Smoking status: Former    Current packs/day: 0.00    Average packs/day: 0.3 packs/day for 35.0 years (8.8 ttl pk-yrs)    Types: Cigarettes    Start date: 01/14/1984    Quit date: 01/14/2019    Years since quitting: 4.1   Smokeless tobacco: Never  Vaping Use   Vaping status: Never Used  Substance and Sexual Activity   Alcohol use: No    Alcohol/week: 0.0 standard drinks of alcohol   Drug use: Never   Sexual activity: Yes  Other Topics Concern   Not on file  Social History Narrative   Not on file   Social Drivers of Health   Financial Resource Strain: Low Risk  (08/27/2022)   Overall Financial Resource Strain (CARDIA)    Difficulty of Paying Living Expenses: Not hard at all  Food Insecurity: No Food Insecurity (08/27/2022)   Hunger Vital Sign    Worried About Running Out of Food in the Last Year: Never true    Ran Out of Food in the Last Year: Never true  Transportation Needs: No Transportation Needs (08/27/2022)   PRAPARE - Administrator, Civil Service (Medical): No    Lack of Transportation (Non-Medical): No  Physical Activity: Insufficiently Active (08/27/2022)   Exercise Vital Sign    Days of Exercise per Week: 4 days    Minutes of Exercise per Session: 30 min  Stress:  No Stress Concern Present (08/27/2022)   [employer] of Occupational Health - Occupational Stress Questionnaire    Feeling of Stress : Not at all  Social Connections: Moderately Isolated (08/27/2022)   Social Connection and Isolation  Panel [NHANES]    Frequency of Communication with Friends and Family: More than three times a week    Frequency of Social Gatherings with Friends and Family: Not on file    Attends Religious Services: Never    Database Administrator or Organizations: No    Attends Banker Meetings: Never    Marital Status: Married  Catering Manager Violence: Not At Risk (08/27/2022)   Humiliation, Afraid, Rape, and Kick questionnaire    Fear of Current or Ex-Partner: No    Emotionally Abused: No    Physically Abused: No    Sexually Abused: No   Social History   Tobacco Use  Smoking Status Former   Current packs/day: 0.00   Average packs/day: 0.3 packs/day for 35.0 years (8.8 ttl pk-yrs)   Types: Cigarettes   Start date: 01/14/1984   Quit date: 01/14/2019   Years since quitting: 4.1  Smokeless Tobacco Never   Social History   Substance and Sexual Activity  Alcohol Use No   Alcohol/week: 0.0 standard drinks of alcohol    Family History:  Family History  Problem Relation Age of Onset   Cancer Father    Diabetes Sister    Thrombosis Sister     Past medical history, surgical history, medications, allergies, family history and social history reviewed with patient today and changes made to appropriate areas of the chart.   Review of Systems  Constitutional: Negative.   HENT: Negative.    Eyes: Negative.   Respiratory:  Positive for wheezing. Negative for cough, hemoptysis, sputum production and shortness of breath.   Cardiovascular: Negative.   Gastrointestinal:  Positive for heartburn. Negative for abdominal pain, blood in stool, constipation, diarrhea, melena, nausea and vomiting.  Genitourinary:  Positive for frequency. Negative for  dysuria, flank pain, hematuria and urgency.  Musculoskeletal: Negative.   Skin: Negative.   Neurological: Negative.   Endo/Heme/Allergies:  Positive for environmental allergies. Negative for polydipsia. Does not bruise/bleed easily.  Psychiatric/Behavioral: Negative.     All other ROS negative except what is listed above and in the HPI.      Objective:    BP 138/82   Pulse 67   Wt 218 lb 12.8 oz (99.2 kg)   SpO2 98%   BMI 31.39 kg/m   Wt Readings from Last 3 Encounters:  03/07/23 218 lb 12.8 oz (99.2 kg)  02/03/23 210 lb (95.3 kg)  01/13/23 215 lb 9.6 oz (97.8 kg)    Physical Exam Vitals and nursing note reviewed.  Constitutional:      General: He is not in acute distress.    Appearance: Normal appearance. He is obese. He is not ill-appearing, toxic-appearing or diaphoretic.  HENT:     Head: Normocephalic and atraumatic.     Right Ear: Tympanic membrane, ear canal and external ear normal. There is no impacted cerumen.     Left Ear: Tympanic membrane, ear canal and external ear normal. There is no impacted cerumen.     Nose: Nose normal. No congestion or rhinorrhea.     Mouth/Throat:     Mouth: Mucous membranes are moist.     Pharynx: Oropharynx is clear. No oropharyngeal exudate or posterior oropharyngeal erythema.  Eyes:     General: No scleral icterus.       Right eye: No discharge.        Left eye: No discharge.     Extraocular Movements: Extraocular movements intact.     Conjunctiva/sclera: Conjunctivae normal.  Pupils: Pupils are equal, round, and reactive to light.  Neck:     Vascular: No carotid bruit.  Cardiovascular:     Rate and Rhythm: Normal rate and regular rhythm.     Pulses: Normal pulses.     Heart sounds: No murmur heard.    No friction rub. No gallop.  Pulmonary:     Effort: Pulmonary effort is normal. No respiratory distress.     Breath sounds: No stridor. Wheezing present. No rhonchi or rales.  Chest:     Chest wall: No tenderness.   Abdominal:     General: Abdomen is flat. Bowel sounds are normal. There is no distension.     Palpations: Abdomen is soft. There is no mass.     Tenderness: There is no abdominal tenderness. There is no right CVA tenderness, left CVA tenderness, guarding or rebound.     Hernia: No hernia is present.  Genitourinary:    Comments: Genital exam deferred with shared decision making Musculoskeletal:        General: No swelling, tenderness, deformity or signs of injury.     Cervical back: Normal range of motion and neck supple. No rigidity. No muscular tenderness.     Right lower leg: No edema.     Left lower leg: No edema.  Lymphadenopathy:     Cervical: No cervical adenopathy.  Skin:    General: Skin is warm and dry.     Capillary Refill: Capillary refill takes less than 2 seconds.     Coloration: Skin is not jaundiced or pale.     Findings: No bruising, erythema, lesion or rash.  Neurological:     General: No focal deficit present.     Mental Status: He is alert and oriented to person, place, and time.     Cranial Nerves: No cranial nerve deficit.     Sensory: No sensory deficit.     Motor: No weakness.     Coordination: Coordination normal.     Gait: Gait normal.     Deep Tendon Reflexes: Reflexes normal.  Psychiatric:        Mood and Affect: Mood normal.        Behavior: Behavior normal.        Thought Content: Thought content normal.        Judgment: Judgment normal.    Results for orders placed or performed in visit on 12/16/22  HM DIABETES EYE EXAM   Collection Time: 12/12/22 12:00 AM  Result Value Ref Range   HM Diabetic Eye Exam No Retinopathy No Retinopathy      Assessment & Plan:   Problem List Items Addressed This Visit       Cardiovascular and Mediastinum   Aortic atherosclerosis (HCC)   Will keep BP and cholesterol and sugar under good control. Continue to monitor.       Relevant Medications   amLODipine  (NORVASC ) 5 MG tablet   atorvastatin  (LIPITOR) 10  MG tablet   hydrALAZINE  (APRESOLINE ) 100 MG tablet   metoprolol  succinate (TOPROL -XL) 100 MG 24 hr tablet   metoprolol  tartrate (LOPRESSOR ) 50 MG tablet   Other Relevant Orders   Comprehensive metabolic panel   CBC with Differential/Platelet   Lipid Panel w/o Chol/HDL Ratio     Respiratory   Pulmonary emphysema (HCC) (Chronic)   Under good control on current regimen. Continue current regimen. Continue to monitor. Call with any concerns. Refills given.        Relevant Medications   albuterol  (PROVENTIL ) (2.5  MG/3ML) 0.083% nebulizer solution   BREZTRI  AEROSPHERE 160-9-4.8 MCG/ACT AERO   montelukast  (SINGULAIR ) 10 MG tablet   Squamous cell carcinoma of left lung (HCC)   Continue to follow with oncology. Call with any concerns.         Endocrine   Type 2 diabetes mellitus with stage 1 chronic kidney disease, without long-term current use of insulin  (HCC)   Doing great with A1c of 6.3! Continue current regimen. Call with any concerns. Recheck 6 months.       Relevant Medications   atorvastatin  (LIPITOR) 10 MG tablet   empagliflozin  (JARDIANCE ) 25 MG TABS tablet   metFORMIN  (GLUCOPHAGE ) 500 MG tablet   Semaglutide  (RYBELSUS ) 14 MG TABS   Other Relevant Orders   Bayer DCA Hb A1c Waived   Comprehensive metabolic panel   CBC with Differential/Platelet   Lipid Panel w/o Chol/HDL Ratio   TSH   Microalbumin, Urine Waived   Hyperlipidemia associated with type 2 diabetes mellitus (HCC)   Under good control on current regimen. Continue current regimen. Continue to monitor. Call with any concerns. Refills given. Labs drawn today.       Relevant Medications   amLODipine  (NORVASC ) 5 MG tablet   atorvastatin  (LIPITOR) 10 MG tablet   empagliflozin  (JARDIANCE ) 25 MG TABS tablet   hydrALAZINE  (APRESOLINE ) 100 MG tablet   metFORMIN  (GLUCOPHAGE ) 500 MG tablet   metoprolol  succinate (TOPROL -XL) 100 MG 24 hr tablet   metoprolol  tartrate (LOPRESSOR ) 50 MG tablet   Semaglutide  (RYBELSUS )  14 MG TABS   Other Relevant Orders   Comprehensive metabolic panel   CBC with Differential/Platelet   Lipid Panel w/o Chol/HDL Ratio     Genitourinary   Benign hypertensive renal disease   Under good control on current regimen. Continue current regimen. Continue to monitor. Call with any concerns. Refills given. Labs drawn today.        Relevant Orders   Comprehensive metabolic panel   CBC with Differential/Platelet   Lipid Panel w/o Chol/HDL Ratio   TSH   Microalbumin, Urine Waived   Other Visit Diagnoses       Routine general medical examination at a health care facility    -  Primary   Vaccines up to date/declined. Screening labs checked today. Colonoscopy up to date. Continue diet and exercise. Call with any concerns.     Hesitancy       Labs drawn today.   Relevant Orders   PSA        LABORATORY TESTING:  Health maintenance labs ordered today as discussed above.   The natural history of prostate cancer and ongoing controversy regarding screening and potential treatment outcomes of prostate cancer has been discussed with the patient. The meaning of a false positive PSA and a false negative PSA has been discussed. He indicates understanding of the limitations of this screening test and wishes to proceed with screening PSA testing.   IMMUNIZATIONS:   - Tdap: Tetanus vaccination status reviewed: last tetanus booster within 10 years. - Influenza: Refused - Pneumovax: Up to date - Prevnar: Not applicable - COVID: Refused - HPV: Not applicable - Shingrix vaccine: Refused  SCREENING: - Colonoscopy: Up to date  Discussed with patient purpose of the colonoscopy is to detect colon cancer at curable precancerous or early stages   PATIENT COUNSELING:    Sexuality: Discussed sexually transmitted diseases, partner selection, use of condoms, avoidance of unintended pregnancy  and contraceptive alternatives.   Advised to avoid cigarette smoking.  I discussed with the  patient that most people either abstain from alcohol or drink within safe limits (<=14/week and <=4 drinks/occasion for males, <=7/weeks and <= 3 drinks/occasion for females) and that the risk for alcohol disorders and other health effects rises proportionally with the number of drinks per week and how often a drinker exceeds daily limits.  Discussed cessation/primary prevention of drug use and availability of treatment for abuse.   Diet: Encouraged to adjust caloric intake to maintain  or achieve ideal body weight, to reduce intake of dietary saturated fat and total fat, to limit sodium intake by avoiding high sodium foods and not adding table salt, and to maintain adequate dietary potassium and calcium  preferably from fresh fruits, vegetables, and low-fat dairy products.    stressed the importance of regular exercise  Injury prevention: Discussed safety belts, safety helmets, smoke detector, smoking near bedding or upholstery.   Dental health: Discussed importance of regular tooth brushing, flossing, and dental visits.   Follow up plan: NEXT PREVENTATIVE PHYSICAL DUE IN 1 YEAR. Return in about 6 months (around 09/04/2023).

## 2023-03-07 NOTE — Assessment & Plan Note (Signed)
 Continue to follow with oncology. Call with any concerns.

## 2023-03-07 NOTE — Assessment & Plan Note (Signed)
 Under good control on current regimen. Continue current regimen. Continue to monitor. Call with any concerns. Refills given.

## 2023-03-08 LAB — CBC WITH DIFFERENTIAL/PLATELET
Basophils Absolute: 0 10*3/uL (ref 0.0–0.2)
Basos: 1 %
EOS (ABSOLUTE): 0.1 10*3/uL (ref 0.0–0.4)
Eos: 2 %
Hematocrit: 45.8 % (ref 37.5–51.0)
Hemoglobin: 14.4 g/dL (ref 13.0–17.7)
Immature Grans (Abs): 0 10*3/uL (ref 0.0–0.1)
Immature Granulocytes: 0 %
Lymphocytes Absolute: 1.6 10*3/uL (ref 0.7–3.1)
Lymphs: 26 %
MCH: 28.6 pg (ref 26.6–33.0)
MCHC: 31.4 g/dL — ABNORMAL LOW (ref 31.5–35.7)
MCV: 91 fL (ref 79–97)
Monocytes Absolute: 0.4 10*3/uL (ref 0.1–0.9)
Monocytes: 6 %
Neutrophils Absolute: 4 10*3/uL (ref 1.4–7.0)
Neutrophils: 65 %
Platelets: 194 10*3/uL (ref 150–450)
RBC: 5.04 x10E6/uL (ref 4.14–5.80)
RDW: 13.1 % (ref 11.6–15.4)
WBC: 6.1 10*3/uL (ref 3.4–10.8)

## 2023-03-08 LAB — LIPID PANEL W/O CHOL/HDL RATIO
Cholesterol, Total: 161 mg/dL (ref 100–199)
HDL: 40 mg/dL (ref >39)
LDL Chol Calc (NIH): 84 mg/dL (ref 0–99)
Triglycerides: 220 mg/dL — ABNORMAL HIGH (ref 0–149)
VLDL Cholesterol Cal: 37 mg/dL (ref 5–40)

## 2023-03-08 LAB — PSA: Prostate Specific Ag, Serum: 0.5 ng/mL (ref 0.0–4.0)

## 2023-03-08 LAB — COMPREHENSIVE METABOLIC PANEL
ALT: 26 [IU]/L (ref 0–44)
AST: 25 [IU]/L (ref 0–40)
Albumin: 4.6 g/dL (ref 3.9–4.9)
Alkaline Phosphatase: 165 [IU]/L — ABNORMAL HIGH (ref 44–121)
BUN/Creatinine Ratio: 13 (ref 10–24)
BUN: 11 mg/dL (ref 8–27)
Bilirubin Total: 0.3 mg/dL (ref 0.0–1.2)
CO2: 25 mmol/L (ref 20–29)
Calcium: 9.7 mg/dL (ref 8.6–10.2)
Chloride: 103 mmol/L (ref 96–106)
Creatinine, Ser: 0.84 mg/dL (ref 0.76–1.27)
Globulin, Total: 2.9 g/dL (ref 1.5–4.5)
Glucose: 178 mg/dL — ABNORMAL HIGH (ref 70–99)
Potassium: 3.7 mmol/L (ref 3.5–5.2)
Sodium: 142 mmol/L (ref 134–144)
Total Protein: 7.5 g/dL (ref 6.0–8.5)
eGFR: 99 mL/min/{1.73_m2} (ref >59)

## 2023-03-08 LAB — TSH: TSH: 1.05 u[IU]/mL (ref 0.450–4.500)

## 2023-03-25 ENCOUNTER — Telehealth: Payer: Self-pay

## 2023-03-25 DIAGNOSIS — G4733 Obstructive sleep apnea (adult) (pediatric): Secondary | ICD-10-CM

## 2023-03-25 NOTE — Telephone Encounter (Signed)
-----   Message from [place] sent at 03/24/2023  5:08 PM EST ----- PATIENT HAS A DIAGNOSIS OF SLEEP APNEA  Option 1 Try autoCPAP 5-10 cm h20 ----- Message ----- From: Lilian Kapur Sent: 03/04/2023   8:59 AM EST To: Erin Fulling, MD

## 2023-03-25 NOTE — Telephone Encounter (Signed)
Patient advised of sleep study result. Patient reports that he has been able to tolerate CPAP in the past. He reports that in one occasion the cord was wrapped around his neck and he woke up choking. He is trying to get Inspire to treat his sleep apnea. Please advise.

## 2023-03-28 NOTE — Telephone Encounter (Signed)
Patient advised of referral to Dr. Vassie Loll or Dr. Aldean Ast in [place]. Patient agreed. Referral placed. Nothing further needed.

## 2023-04-01 ENCOUNTER — Telehealth: Payer: Self-pay | Admitting: Pulmonary Disease

## 2023-04-01 NOTE — Telephone Encounter (Signed)
-----   Message from [facility] sent at 03/31/2023  5:14 PM EST ----- Regarding: RE: Inspire Referral Pt would like to be referred for DISE.  Could either of you complete DISE for this patient?  Per Dr. Belia Heman, pt completed sleep study on 01/22/2023.   Thanks,  Jeanice Lim ----- Message ----- From: Lilian Kapur Sent: 03/28/2023   1:30 PM EST To: Antionette Fairy Subject: Inspire Referral                               Can you look at this referral? First time I have ever seen this  Thanks Synetta Fail

## 2023-04-01 NOTE — Telephone Encounter (Signed)
His 4 % AHI is 10/hour He would not qualify for  inspire implantation

## 2023-04-04 NOTE — Telephone Encounter (Signed)
I notified the patient and scheduled him an appt with Dr. Belia Heman to review his sleep study and go over his options.  Nothing further needed.

## 2023-04-09 ENCOUNTER — Ambulatory Visit (INDEPENDENT_AMBULATORY_CARE_PROVIDER_SITE_OTHER): Payer: 59 | Admitting: Internal Medicine

## 2023-04-09 ENCOUNTER — Encounter: Payer: Self-pay | Admitting: Internal Medicine

## 2023-04-09 VITALS — BP 122/88 | HR 75 | Temp 97.6°F | Ht 70.0 in | Wt 222.2 lb

## 2023-04-09 DIAGNOSIS — C3432 Malignant neoplasm of lower lobe, left bronchus or lung: Secondary | ICD-10-CM

## 2023-04-09 DIAGNOSIS — G4733 Obstructive sleep apnea (adult) (pediatric): Secondary | ICD-10-CM

## 2023-04-09 DIAGNOSIS — J4520 Mild intermittent asthma, uncomplicated: Secondary | ICD-10-CM | POA: Diagnosis not present

## 2023-04-09 NOTE — Patient Instructions (Signed)
 Lets plan to lose weight approximately 20 pounds in the next 6 months We will reevaluate repeat sleep study Please cut down on soda beverages Plan to reduce Breztri  to 2 puffs in the morning only and watch your breathing  Avoid Allergens and Irritants Avoid secondhand smoke Avoid SICK contacts Recommend  Masking  when appropriate Recommend Keep up-to-date with vaccinations

## 2023-04-09 NOTE — Progress Notes (Signed)
 Subjective:    Patient ID: [name], male    DOB: 19-Jan-1961, 63 y.o.   MRN: [id number] Oncological history lung nodule history  Lung nodule assessment and documentation The lesion was initially noted on a CT scan of the chest of 21 August 2017 this was for lung cancer screening purposes.   At that time the scan showed bilateral pulmonary nodules and the particular nodule in question was 5.6 mm.   Subsequently he had follow-up CT on 16 October 2018 which showed that the nodule in question was now measuring 10.3 mm and had apparent bronchograms through it.   PET/CT obtained on 2 September showed low SUV of 2.06 however slow-growing neoplasm could not be excluded.   Patient has remained asymptomatic with regards to this nodule.  As noted he underwent navigational bronchoscopy and Endo microscopy.  Endo-microscopy revealed that the target lesion had been reached to successfully however the characteristics were ill-defined and the lesion appeared to be surrounded by inflammation.  Pathology is consistent with inflammatory change including reactive bronchial cells mixed inflammatory cells and eosinophils.    Because of this we have made a referral to thoracic surgery for evaluation of wedge resection.   ONCOLOGY HISTORY:  Patient underwent lung resection on January 14, 2019 and had a complicated postoperative course.  CT scan results from August 14, 2019 a suspicious subcarinal lymph node measuring 1.4 cm.  Follow-up PET scan on August 23, 2019 revealed hypermetabolism in lymph node highly suspicious for recurrence.  Biopsy confirmed recurrence increasing patient's stage from IA up to IIIA.  Patient completed concurrent XRT and weekly carboplatinum and Taxol  on January 05, 2020. Patient completed 1 year of maintenance durvalumab  on January 17, 2021.        CC Assessment of OSA Tobacco abuse Follow-up assessment for asthma Previous history of abnormal CT chest with lung nodule stage  III squamous cell carcinoma  HPI Assessment of OSA HST shows AHI of 10 which is mild OSA Sleep certified physician reviewed findings patient is not a candidate for inspire device   Assessment of asthma  Seems to be well-controlled at this time Patient uses Breztri  and albuterol  as needed  Plan to use Breztri  2 puffs in the morning only Please rinse mouth after use Exposure to cats   No exacerbation at this time No evidence of heart failure at this time No evidence or signs of infection at this time No respiratory distress No fevers, chills, nausea, vomiting, diarrhea No evidence of lower extremity edema No evidence hemoptysis  Current weight 223 pounds at 510 And is to lose 20 pounds in the next 6 months We will not start CPAP therapy due to difficulty with the machine and wires Patient has mild OSA based on his recent HST It is reasonable to reassess and reevaluate with repeating HST after he loses weight    Active Ambulatory Problems    Diagnosis Date Noted   Chronic low back pain (1ry area of Pain) (Bilateral) (L>R) 01/05/2015   Lumbar spondylosis 01/05/2015   Chronic lumbar radicular pain (S1 dermatomal) (Left) 01/05/2015   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) 01/05/2015   Chronic neck pain (posterior midline) (Bilateral) (L>R) 01/05/2015   Cervical spondylosis 01/05/2015   Chronic cervical radicular pain (Bilateral) (C5/C6 dermatome) (L>R) 01/05/2015   Long term current use of opiate analgesic 01/05/2015   Long term prescription opiate use 01/05/2015   Opiate use (60 MME/Day) 01/05/2015   Uncomplicated opioid dependence (HCC) 01/05/2015  Cervical spinal stenosis (C4-5) 01/06/2015   Cervical foraminal stenosis (Bilateral C5-6) 01/06/2015   Hypokalemia 02/01/2015   Retrolisthesis of L5-S1 02/06/2015   Cervical disc herniation (C4-5 and C5-6) 02/06/2015   Lumbar disc herniation (L5-S1) 02/06/2015   Chronic sacroiliac joint pain  (Bilateral) (L>R) 05/31/2015   Chronic hip pain (Left) 05/31/2015   Lumbar facet syndrome (Bilateral) (L>R) 05/31/2015   Chronic lower extremity pain (2ry area of Pain) (Left) 05/31/2015   Greater occipital neuralgia (Right) 05/31/2015   Chronic pain syndrome 01/11/2016   Benign hypertensive renal disease 01/22/2016   Migraine 01/22/2016   Pharmacologic therapy    Benign neoplasm of ascending colon    Polyp of sigmoid colon    Rectal polyp    History of tobacco abuse 06/24/2016   GERD (gastroesophageal reflux disease) 06/24/2016   Musculoskeletal pain, chronic 04/03/2017   Left-sided weakness 08/13/2017   Aortic atherosclerosis (HCC) 08/25/2017   Hip arthritis 10/01/2017   S/P hip replacement 12/02/2017   Type 2 diabetes mellitus with stage 1 chronic kidney disease, without long-term current use of insulin  (HCC) 12/30/2017   History of colonic polyps 02/02/2019   Hyperlipidemia associated with type 2 diabetes mellitus (HCC) 07/20/2018   History of allergy to ester type local anesthetic (procaine) 10/21/2018   Left lower lobe pulmonary nodule 10/26/2018   Lung mass 01/14/2019   Squamous cell carcinoma of left lung (HCC) 01/16/2019   Chronic upper extremity pain (3ry area of Pain) (Bilateral) (L>R) 01/19/2019   Shortness of breath 02/02/2019   OSA (obstructive sleep apnea) 06/16/2019   Tobacco abuse 06/24/2016   Pulmonary emphysema (HCC) 08/25/2019   Goals of care, counseling/discussion 11/01/2019   Chronic use of opiate for therapeutic purpose 08/20/2020   Port-A-Cath in place 07/12/2021   Cancer associated pain 11/06/2022   Malignant neoplasm of lower lobe of left lung (HCC) 11/06/2022   History of lower lobectomy of lung (Left) 11/06/2022   Resolved Ambulatory Problems    Diagnosis Date Noted   Encounter for therapeutic drug level monitoring 01/05/2015   Allergy history, anesthetic (Unconfirmed allergy to Lidocaine ) 05/31/2015   HTN (hypertension) 08/13/2017   Atrial  fibrillation with RVR (HCC) 02/02/2019   Shortness of breath    Acute respiratory failure with hypoxia (HCC) 02/02/2019   Hospital discharge follow-up 02/06/2019   Postoperative atrial fibrillation (HCC) 02/02/2019   Sinus congestion 04/10/2021   Past Medical History:  Diagnosis Date   Allergy    Arthritis    Benign hypertensive kidney disease    Chronic back pain    Diabetes mellitus, type 2 (HCC)    Dyspnea    Hypertension    Malignant neoplasm of lung (HCC)    Migraines      BP 122/88 (BP Location: Left Arm, Patient Position: Sitting, Cuff Size: Normal)   Pulse 75   Temp 97.6 F (36.4 C) (Temporal)   Ht 5' 10 (1.778 m)   Wt 222 lb 3.2 oz (100.8 kg)   SpO2 96%   BMI 31.88 kg/m      Review of Systems: Gen:  Denies  fever, sweats, chills weight loss  HEENT: Denies blurred vision, double vision, ear pain, eye pain, hearing loss, nose bleeds, sore throat Cardiac:  No dizziness, chest pain or heaviness, chest tightness,edema, No JVD Resp:   No cough, -sputum production, -shortness of breath,-wheezing, -hemoptysis,  Other:  All other systems negative   Physical Examination:   General Appearance: No distress  EYES PERRLA, EOM intact.   NECK Supple, No JVD  Pulmonary: normal breath sounds, No wheezing.  CardiovascularNormal S1,S2.  No m/r/g.   Abdomen: Benign, Soft, non-tender. Neurology UE/LE 5/5 strength, no focal deficits Ext pulses intact, cap refill intact ALL OTHER ROS ARE NEGATIVE       Assessment & Plan:   63 year old pleasant white male seen today for previous tobacco abuser with left lower lobe nodule consistent with stage III lung cancer squamous cell carcinoma which had metastasized to the subcarinal lymph nodes with a history of mild persistent asthma history of reflux and underlying mild OSA     Regarding OSA  Patient is not a candidate for inspire device  Other options discussed with patient in detail  We will try option to lose weight and  repeat study Be aware of reduced alertness and do not drive or operate heavy machinery if experiencing this or drowsiness.  Exercise encouraged, as tolerated. Encouraged proper weight management.  Important to get eight or more hours of sleep  Limiting the use of the computer and television before bedtime.  Decrease naps during the day, so night time sleep will become enhanced.  Limit caffeine, and sleep deprivation.  HTN, stroke, uncontrolled diabetes and heart failure are potential risk factors.  Risk of untreated sleep apnea including cardiac arrhthymias, stroke, DM, pulm HTN.     Left lower lobe lung nodule stage III cervical carcinoma Follow-up oncology Previous CT chest 2024 did not show any significant changes  Asthma Well-controlled mild intermittent Plan to use Breztri  2 puffs in a.m. only and assess respiratory status Advised to rinse mouth after every use Avoid exposure to cats  Obesity -recommend significant weight loss -recommend changing diet  Deconditioned state -Recommend increased daily activity and exercise   MEDICATION ADJUSTMENTS/LABS AND TESTS ORDERED: Weight loss goal at next office visit is to lose 20 pounds current weight 222 Use your Albuterol  inhaler as needed Plan to use Breztri  inhaler 2 puffs in a.m. only and watch respiratory symptoms Avoid secondhand smoke Avoid SICK contacts Recommend  Masking  when appropriate Recommend Keep up-to-date with vaccinations Follow up with Dr Finnegen as scheduled   CURRENT MEDICATIONS REVIEWED AT LENGTH WITH PATIENT TODAY   Patient  satisfied with Plan of action and management. All questions answered  Follow up 6 months  Total Time Spent  41 mins   Nickolas Alm Cellar, M.D.  [facility]  Medical Director [facility] Medical Director [facility] Cardio-Pulmonary Department

## 2023-05-06 NOTE — Patient Instructions (Incomplete)

## 2023-05-06 NOTE — Progress Notes (Unsigned)
 PROVIDER NOTE: Information contained herein reflects review and annotations entered in association with encounter. Interpretation of such information and data should be left to medically-trained personnel. Information provided to patient can be located elsewhere in the medical record under "Patient Instructions". Document created using STT-dictation technology, any transcriptional errors that may result from process are unintentional.    Patient: Kristopher [name]  Service Category: E/M  Provider: Oswaldo Done, MD  DOB: 1960/11/10  DOS: 05/07/2023  Referring Provider: Dorcas Carrow, DO  MRN: [id number]  Specialty: Interventional Pain Management  PCP: Dorcas Carrow, DO  Type: Established Patient  Setting: Ambulatory outpatient    Location: Office  Delivery: Face-to-face     HPI  [name], a 63 y.o. year old male, is here today because of his Chronic pain syndrome [G89.4]. [name] primary complain today is No chief complaint on file.  Pertinent problems: [name] has Chronic low back pain (1ry area of Pain) (Bilateral) (L>R); Lumbar spondylosis; Chronic lumbar radicular pain (S1 dermatomal) (Left); 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); Chronic neck pain (posterior midline) (Bilateral) (L>R); Cervical spondylosis; Chronic cervical radicular pain (Bilateral) (C5/C6 dermatome) (L>R); Cervical spinal stenosis (C4-5); Cervical foraminal stenosis (Bilateral C5-6); Retrolisthesis of L5-S1; Cervical disc herniation (C4-5 and C5-6); Lumbar disc herniation (L5-S1); Chronic sacroiliac joint pain (Bilateral) (L>R); Chronic hip pain (Left); Lumbar facet syndrome (Bilateral) (L>R); Chronic lower extremity pain (2ry area of Pain) (Left); Greater occipital neuralgia (Right); Chronic pain syndrome; Migraine; Musculoskeletal pain, chronic; Left-sided weakness; Hip arthritis; S/P hip replacement; Squamous cell carcinoma of left lung (HCC); Chronic upper extremity pain  (3ry area of Pain) (Bilateral) (L>R); Cancer associated pain; Malignant neoplasm of lower lobe of left lung (HCC); and History of lower lobectomy of lung (Left) on their pertinent problem list. Pain Assessment: Severity of   is reported as a  /10. Location:    / . Onset:  . Quality:  . Timing:  . Modifying factor(s):  [name] Vitals:  vitals were not taken for this visit.  BMI: Estimated body mass index is 31.88 kg/m as calculated from the following:   Height as of 04/09/23: 5\' 10"  (1.778 m).   Weight as of 04/09/23: 222 lb 3.2 oz (100.8 kg). Last encounter: 02/03/2023. Last procedure: Visit date not found.  Reason for encounter: medication management. ***  Discussed the use of AI scribe software for clinical note transcription with the patient, who gave verbal consent to proceed.  History of Present Illness         08/08/2023   Pharmacotherapy Assessment  Analgesic: Morphine IR 15 mg, 1 tablet p.o. every 6 hours (60 mg/day of morphine) (60 MME) + Compounded Specialty Analgesic Cream (w/ Ketamine) MME/day: 60 mg/day.   Monitoring: [place] PMP: PDMP reviewed during this encounter.       Pharmacotherapy: No side-effects or adverse reactions reported. Compliance: No problems identified. Effectiveness: Clinically acceptable.  No notes on file  No results found for: "CBDTHCR" No results found for: "D8THCCBX" No results found for: "D9THCCBX"  UDS:  Summary  Date Value Ref Range Status  05/08/2022 Note  Final    Comment:    ==================================================================== [employer] Select 13 (MW) ==================================================================== Test                             Result       Flag       Units  Drug Present not Declared for Prescription Verification  Alpha-hydroxymidazolam         347          UNEXPECTED ng/mg creat    Alpha-hydroxymidazolam is an expected metabolite of midazolam.    Source of midazolam is a scheduled prescription  medication.    Morphine                       8921         UNEXPECTED ng/mg creat    A moderate to large amount of morphine is present; the metabolites    normorphine and hydromorphone are not present. This is an atypical    result. Although patients with unusual metabolic profiles exist,    they are rare. Review of previous drug screen results or collection    of a urine sample several hours after a WITNESSED dose of the drug    may help to clarify the subject's ability to produce metabolite.     Potential sources of large amounts of morphine in the absence of    codeine include administration of morphine or use of heroin.  ==================================================================== Test                      Result    Flag   Units      Ref Range   Creatinine              73               mg/dL      >=40 ==================================================================== Declared Medications:  The flagging and interpretation on this report are based on the  following declared medications.  Unexpected results may arise from  inaccuracies in the declared medications. ==================================================================== For clinical consultation, please call [phone]. ====================================================================       ROS  Constitutional: Denies any fever or chills Gastrointestinal: No reported hemesis, hematochezia, vomiting, or acute GI distress Musculoskeletal: Denies any acute onset joint swelling, redness, loss of ROM, or weakness Neurological: No reported episodes of acute onset apraxia, aphasia, dysarthria, agnosia, amnesia, paralysis, loss of coordination, or loss of consciousness  Medication Review  Accu-Chek FastClix Lancets, Budeson-Glycopyrrol-Formoterol, Potassium Chloride ER, SUMAtriptan, Semaglutide, Tiotropium Bromide-Olodaterol, albuterol, amLODipine, atorvastatin, diphenhydrAMINE, empagliflozin, esomeprazole,  glucose blood, hydrALAZINE, lidocaine, metFORMIN, metoprolol succinate, metoprolol tartrate, montelukast, morphine, multivitamin with minerals, naloxone, and nortriptyline  History Review  Allergy: [name] is allergic to acetaminophen, aspirin, epinephrine, novocain [procaine], penicillins, strawberry extract, and shellfish allergy. Drug: [name]  reports no history of drug use. Alcohol:  reports no history of alcohol use. Tobacco:  reports that he quit smoking about 4 years ago. His smoking use included cigarettes. He started smoking about 39 years ago. He has a 8.8 pack-year smoking history. He has never used smokeless tobacco. Social: [name]  reports that he quit smoking about 4 years ago. His smoking use included cigarettes. He started smoking about 39 years ago. He has a 8.8 pack-year smoking history. He has never used smokeless tobacco. He reports that he does not drink alcohol and does not use drugs. Medical:  has a past medical history of Allergy, Arthritis, Benign hypertensive kidney disease, Chronic back pain, Diabetes mellitus, type 2 (HCC), Dyspnea, GERD (gastroesophageal reflux disease), Hypertension, Malignant neoplasm of lung (HCC), and Migraines. Surgical: [name]  has a past surgical history that includes Back surgery; Appendectomy; Leg Surgery; Knee surgery (Left); Foot surgery (Left); Colonoscopy with propofol (N/A, 02/19/2016); polypectomy (N/A, 02/19/2016); Total hip arthroplasty (Left, 12/02/2017); Colonoscopy  with propofol (N/A, 01/19/2018); polypectomy (01/19/2018); DG OPERATIVE LEFT HIP ([facility] HX); Joint replacement (Left, 12/2017); Video bronchoscopy (Left, 01/14/2019); Thoracotomy (Left, 01/14/2019); Flexible bronchoscopy (Bilateral, 01/20/2019); Flexible bronchoscopy (Bilateral, 01/22/2019); Electormagnetic navigation bronchoscopy (Left, 11/18/2018); Lung cancer surgery; Video bronchoscopy with endobronchial ultrasound (N/A, 10/15/2019); Video bronchoscopy with  endobronchial navigation (N/A, 10/15/2019); Portacath placement (N/A, 11/11/2019); Colonoscopy with propofol (N/A, 03/16/2021); and Colonoscopy with propofol (N/A, 04/06/2021). Family: family history includes Cancer in his father; Diabetes in his sister; Thrombosis in his sister.  Laboratory Chemistry Profile   Renal Lab Results  Component Value Date   BUN 11 03/07/2023   CREATININE 0.84 03/07/2023   BCR 13 03/07/2023   GFRAA >60 12/01/2019   GFRNONAA >60 07/05/2021    Hepatic Lab Results  Component Value Date   AST 25 03/07/2023   ALT 26 03/07/2023   ALBUMIN 4.6 03/07/2023   ALKPHOS 165 (H) 03/07/2023   LIPASE 50 02/17/2019    Electrolytes Lab Results  Component Value Date   NA 142 03/07/2023   K 3.7 03/07/2023   CL 103 03/07/2023   CALCIUM 9.7 03/07/2023   MG 2.4 01/25/2019   PHOS 3.5 01/23/2019    Bone No results found for: "VD25OH", "VD125OH2TOT", "TF5732KG2", "RK2706CB7", "25OHVITD1", "25OHVITD2", "25OHVITD3", "TESTOFREE", "TESTOSTERONE"  Inflammation (CRP: Acute Phase) (ESR: Chronic Phase) Lab Results  Component Value Date   CRP 0.7 04/03/2015   ESRSEDRATE 11 10/21/2017   LATICACIDVEN 1.7 01/16/2019         Note: Above Lab results reviewed.  Recent Imaging Review  CT Chest W Contrast CLINICAL DATA:  Follow-up lung cancer. LEFT upper lobe lobectomy November 2020. Post chemo radiation therapy 2021. * Tracking Code: BO *  EXAM: CT CHEST WITH CONTRAST  TECHNIQUE: Multidetector CT imaging of the chest was performed during intravenous contrast administration.  RADIATION DOSE REDUCTION: This exam was performed according to the departmental dose-optimization program which includes automated exposure control, adjustment of the mA and/or kV according to patient size and/or use of iterative reconstruction technique.  CONTRAST:  75mL OMNIPAQUE IOHEXOL 300 MG/ML  SOLN  COMPARISON:  Chest CT 05/08/2022  FINDINGS: Cardiovascular: No significant vascular findings.  Normal heart size. No pericardial effusion.  Mediastinum/Nodes: No axillary or supraclavicular adenopathy. No mediastinal or hilar adenopathy. No pericardial fluid. Esophagus normal.  Lungs/Pleura: LEFT upper lobectomy anatomy. Mild consolidation about the LEFT hilum with air bronchograms typical of radiation change. No LEFT lung nodularity.  Subpleural nodule in the RIGHT upper lobe posteriorly measures 5 mm (image 81/3) not changed from prior.  Upper Abdomen: Limited view of the liver, kidneys, pancreas are unremarkable. Normal adrenal glands.  Musculoskeletal: No aggressive osseous lesion.  IMPRESSION: 1. LEFT upper lobectomy anatomy. No evidence of local tumor recurrence. 2. Stable radiation change about the LEFT hilum. 3. Stable RIGHT upper lobe pulmonary nodule. 4. No evidence of metastatic adenopathy.  Electronically Signed   By: Genevive Bi M.D.   On: 11/27/2022 10:39 Note: Reviewed        Physical Exam  General appearance: Well nourished, well developed, and well hydrated. In no apparent acute distress Mental status: Alert, oriented x 3 (person, place, & time)       Respiratory: No evidence of acute respiratory distress Eyes: PERLA Vitals: There were no vitals taken for this visit. BMI: Estimated body mass index is 31.88 kg/m as calculated from the following:   Height as of 04/09/23: 5\' 10"  (1.778 m).   Weight as of 04/09/23: 222 lb 3.2 oz (100.8 kg). Ideal: Patient weight  not recorded  Assessment   Diagnosis Status  1. Chronic pain syndrome   2. Chronic low back pain (1ry area of Pain) (Bilateral) (L>R)   3. Chronic lower extremity pain (2ry area of Pain) (Left)   4. Chronic upper extremity pain (3ry area of Pain) (Bilateral) (L>R)   5. 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)   6. Squamous cell carcinoma of left lung (HCC)   7. Pharmacologic therapy   8. Chronic use of opiate for therapeutic purpose   9. Encounter for  medication management   10. Encounter for chronic pain management   11. Cancer associated pain   12. Malignant neoplasm of lower lobe of left lung (HCC)    Controlled Controlled Controlled   Updated Problems: No problems updated.  Plan of Care  Problem-specific:  Assessment and Plan            [name] has a current medication list which includes the following long-term medication(s): albuterol, albuterol, amlodipine, atorvastatin, esomeprazole, hydralazine, metformin, metoprolol succinate, metoprolol tartrate, montelukast, morphine, nortriptyline, potassium chloride er, and sumatriptan.  Pharmacotherapy (Medications Ordered): No orders of the defined types were placed in this encounter.  Orders:  No orders of the defined types were placed in this encounter.  Follow-up plan:   No follow-ups on file.      Interventional Therapies  Risk Factors  Complex Considerations:   High Risk for SUD.    Planned  Pending:   None at this time. Claims to have anaphylactic allergy to Local Anesthetics. He was sent for testing and shown/proven not to be allergic to Lidocaine.    Under consideration:   None   Completed:   We have now called his compounding pharmacy twice, one in 2022 and on 08/13/2021 and they have confirmed that they do on her day 1 year refill prescription for his nonformulary cream.  The last prescription was sent to them on 07/31/2021 and should be good until 07/31/2022.  They have confirmed on 08/13/2021 that they are in position of this prescription. On the patient's initial evaluation he claims to have an anaphylactic allergy to lidocaine.  The patient was sent to [facility] dermatology where he was skin tested and found to have absolutely no allergies to lidocaine.  Neither ropivacaine (Naropin) nor bupivacaine (Sensorcaine) were tested.   Therapeutic  Palliative (PRN) options:   None.   Pharmacotherapy  Compounded medication was transferred on  02/06/2022.  At that time, he still had enough refills to last until 07/31/2022. Compounding pharmacy honors 1 year scripts.        Recent Visits No visits were found meeting these conditions. Showing recent visits within past 90 days and meeting all other requirements Future Appointments Date Type Provider Dept  05/07/23 Appointment Delano Metz, MD [facility]  Showing future appointments within next 90 days and meeting all other requirements  I discussed the assessment and treatment plan with the patient. The patient was provided an opportunity to ask questions and all were answered. The patient agreed with the plan and demonstrated an understanding of the instructions.  Patient advised to call back or seek an in-person evaluation if the symptoms or condition worsens.  Duration of encounter: *** minutes.  Total time on encounter, as per AMA guidelines included both the face-to-face and non-face-to-face time personally spent by the physician and/or other qualified health care professional(s) on the day of the encounter (includes time in activities that require the physician or other qualified health  care professional and does not include time in activities normally performed by clinical staff). Physician's time may include the following activities when performed: Preparing to see the patient (e.g., pre-charting review of records, searching for previously ordered imaging, lab work, and nerve conduction tests) Review of prior analgesic pharmacotherapies. Reviewing PMP Interpreting ordered tests (e.g., lab work, imaging, nerve conduction tests) Performing post-procedure evaluations, including interpretation of diagnostic procedures Obtaining and/or reviewing separately obtained history Performing a medically appropriate examination and/or evaluation Counseling and educating the patient/family/caregiver Ordering medications, tests, or procedures Referring and communicating  with other health care professionals (when not separately reported) Documenting clinical information in the electronic or other health record Independently interpreting results (not separately reported) and communicating results to the patient/ family/caregiver Care coordination (not separately reported)  Note by: Oswaldo Done, MD Date: 05/07/2023; Time: 11:14 AM

## 2023-05-07 ENCOUNTER — Ambulatory Visit: Payer: 59 | Attending: Pain Medicine | Admitting: Pain Medicine

## 2023-05-07 ENCOUNTER — Encounter: Payer: Self-pay | Admitting: Pain Medicine

## 2023-05-07 VITALS — BP 119/81 | HR 70 | Temp 97.3°F | Ht 70.0 in | Wt 222.0 lb

## 2023-05-07 DIAGNOSIS — M79602 Pain in left arm: Secondary | ICD-10-CM | POA: Insufficient documentation

## 2023-05-07 DIAGNOSIS — Z79891 Long term (current) use of opiate analgesic: Secondary | ICD-10-CM | POA: Diagnosis not present

## 2023-05-07 DIAGNOSIS — Z79899 Other long term (current) drug therapy: Secondary | ICD-10-CM

## 2023-05-07 DIAGNOSIS — M961 Postlaminectomy syndrome, not elsewhere classified: Secondary | ICD-10-CM

## 2023-05-07 DIAGNOSIS — G894 Chronic pain syndrome: Secondary | ICD-10-CM

## 2023-05-07 DIAGNOSIS — G8929 Other chronic pain: Secondary | ICD-10-CM

## 2023-05-07 DIAGNOSIS — M79605 Pain in left leg: Secondary | ICD-10-CM | POA: Diagnosis not present

## 2023-05-07 DIAGNOSIS — C3492 Malignant neoplasm of unspecified part of left bronchus or lung: Secondary | ICD-10-CM

## 2023-05-07 DIAGNOSIS — M545 Low back pain, unspecified: Secondary | ICD-10-CM

## 2023-05-07 DIAGNOSIS — M79601 Pain in right arm: Secondary | ICD-10-CM | POA: Diagnosis not present

## 2023-05-07 DIAGNOSIS — M5441 Lumbago with sciatica, right side: Secondary | ICD-10-CM | POA: Insufficient documentation

## 2023-05-07 DIAGNOSIS — M5442 Lumbago with sciatica, left side: Secondary | ICD-10-CM | POA: Diagnosis not present

## 2023-05-07 DIAGNOSIS — G893 Neoplasm related pain (acute) (chronic): Secondary | ICD-10-CM

## 2023-05-07 DIAGNOSIS — C3432 Malignant neoplasm of lower lobe, left bronchus or lung: Secondary | ICD-10-CM

## 2023-05-07 MED ORDER — MORPHINE SULFATE 15 MG PO TABS: 15.0000 mg | ORAL_TABLET | Freq: Four times a day (QID) | ORAL | 0 refills | Status: DC | PRN

## 2023-05-07 NOTE — Progress Notes (Signed)
 Nursing Pain Medication Assessment:  Safety precautions to be maintained throughout the outpatient stay will include: orient to surroundings, keep bed in low position, maintain call bell within reach at all times, provide assistance with transfer out of bed and ambulation.  Medication Inspection Compliance: Pill count conducted under aseptic conditions, in front of the patient. Neither the pills nor the bottle was removed from the patient's sight at any time. Once count was completed pills were immediately returned to the patient in their original bottle.  Medication: Morphine IR Pill/Patch Count:  60 of 120 pills remain Pill/Patch Appearance: Markings consistent with prescribed medication Bottle Appearance: Standard pharmacy container. Clearly labeled. Filled Date: 2 / 64 / 2025 Last Medication intake:  TodaySafety precautions to be maintained throughout the outpatient stay will include: orient to surroundings, keep bed in low position, maintain call bell within reach at all times, provide assistance with transfer out of bed and ambulation.

## 2023-05-10 LAB — TOXASSURE SELECT 13 (MW), URINE

## 2023-07-29 NOTE — Progress Notes (Unsigned)
 PROVIDER NOTE: Interpretation of information contained herein should be left to medically-trained personnel. Specific patient instructions are provided elsewhere under "Patient Instructions" section of medical record. This document was created in part using AI and STT-dictation technology, any transcriptional errors that may result from this process are unintentional.  Patient: Kristopher [name]  Service: E/M   PCP: Solomon Dupre, DO  DOB: [birth year]  DOS: 07/30/2023  Provider: Cherylin Corrigan, NP  MRN: [id number]  Delivery: Face-to-face  Specialty: Interventional Pain Management  Type: Established Patient  Setting: Ambulatory outpatient facility  Specialty designation: 09  Referring Prov.: Solomon Dupre, DO  Location: Outpatient office facility       History of present illness (HPI) Kristopher [name], a 63 y.o. year old male, is here today because of his No primary diagnosis found.. Kristopher [name] primary complain today is No chief complaint on file.  Pertinent problems: Kristopher [name] does not have any pertinent problems on file.  Pain Assessment: Severity of   is reported as a  /10. Location:    / . Onset:  . Quality:  . Timing:  . Modifying factor(s):  Aaron Aas Vitals:  vitals were not taken for this visit.  BMI: Estimated body mass index is 31.85 kg/m as calculated from the following:   Height as of 05/07/23: 5\' 10"  (1.778 m).   Weight as of 05/07/23: 222 lb (100.7 kg).  Last encounter: Visit date not found. Last procedure: Visit date not found.  Reason for encounter: medication management.  The patient indicates doing well with current medication regimen.  No adverse reaction or side effects reported to medication.  Pharmacotherapy Assessment  Analgesic: Morphine  (MS IR) 15 mg tablet every 6 hours as needed for moderate pain or severe pain. MME=60 Monitoring: [place] PMP: PDMP reviewed during this encounter.       Pharmacotherapy: No side-effects or adverse reactions reported. Compliance: No  problems identified. Effectiveness: Clinically acceptable.  No notes on file  No results found for: "CBDTHCR" No results found for: "D8THCCBX" No results found for: "D9THCCBX"  UDS:  Summary  Date Value Ref Range Status  05/07/2023 FINAL  Final    Comment:    ==================================================================== [employer] [facility] 13 (MW) ==================================================================== Test                             Result       Flag       Units  Drug Present and Declared for Prescription Verification   Morphine                        [phone]       EXPECTED   ng/mg creat   Normorphine                    113          EXPECTED   ng/mg creat    Potential sources of large amounts of morphine  in the absence of    codeine include administration of morphine  or use of heroin.     Normorphine is an expected metabolite of morphine .  ==================================================================== Test                      Result    Flag   Units      Ref Range   Creatinine              72  mg/dL      >=40 ==================================================================== Declared Medications:  The flagging and interpretation on this report are based on the  following declared medications.  Unexpected results may arise from  inaccuracies in the declared medications.   **Note: The testing scope of this panel includes these medications:   Morphine    **Note: The testing scope of this panel does not include the  following reported medications:   Albuterol  (Ventolin  HFA)  Amlodipine  (Norvasc )  Budesonide (Breztri  Aerosphere)  Diphenhydramine  (Benadryl )  Empagliflozin  (Jardiance )  Esomeprazole  (Nexium )  Formoterol (Breztri  Aerosphere)  Glycopyrrolate (Breztri  Aerosphere)  Hydralazine  (Apresoline )  Metformin   Metoprolol  (Toprol )  Montelukast  (Singulair )  Multivitamin  Naloxone  (Narcan )  Nortriptyline  (Pamelor )  Olodaterol  (Stiolto Respimat )  Potassium Chloride   Semaglutide  (Rybelsus )  Sumatriptan  (Imitrex )  Tiotropium (Stiolto Respimat )  Topical Lidocaine  ==================================================================== For clinical consultation, please call [phone]. ====================================================================      ROS  Constitutional: Denies any fever or chills Gastrointestinal: No reported hemesis, hematochezia, vomiting, or acute GI distress Musculoskeletal: Denies any acute onset joint swelling, redness, loss of ROM, or weakness Neurological: No reported episodes of acute onset apraxia, aphasia, dysarthria, agnosia, amnesia, paralysis, loss of coordination, or loss of consciousness  Medication Review  Accu-Chek FastClix Lancets, Potassium Chloride  ER, SUMAtriptan , Semaglutide , Tiotropium Bromide-Olodaterol, albuterol , amLODipine , atorvastatin , budesonide-glycopyrrolate-formoterol, diphenhydrAMINE , empagliflozin , esomeprazole , glucose blood, hydrALAZINE , lidocaine , metFORMIN , metoprolol  succinate, metoprolol  tartrate, montelukast , morphine , multivitamin with minerals, naloxone , and nortriptyline   History Review  Allergy: Kristopher [name] is allergic to acetaminophen , aspirin, epinephrine, novocain [procaine], penicillins, strawberry extract, and shellfish allergy. Drug: Kristopher [name]  reports no history of drug use. Alcohol:  reports no history of alcohol use. Tobacco:  reports that he quit smoking about 4 years ago. His smoking use included cigarettes. He started smoking about 39 years ago. He has a 8.8 pack-year smoking history. He has never used smokeless tobacco. Social: Kristopher [name]  reports that he quit smoking about 4 years ago. His smoking use included cigarettes. He started smoking about 39 years ago. He has a 8.8 pack-year smoking history. He has never used smokeless tobacco. He reports that he does not drink alcohol and does not use drugs. Medical:  has a past medical  history of Allergy, Arthritis, Benign hypertensive kidney disease, Chronic back pain, Diabetes mellitus, type 2 (HCC), Dyspnea, GERD (gastroesophageal reflux disease), Hypertension, Malignant neoplasm of lung (HCC), and Migraines. Surgical: Kristopher [name]  has a past surgical history that includes Back surgery; Appendectomy; Leg Surgery; Knee surgery (Left); Foot surgery (Left); Colonoscopy with propofol  (N/A, 02/19/2016); polypectomy (N/A, 02/19/2016); Total hip arthroplasty (Left, 12/02/2017); Colonoscopy with propofol  (N/A, 01/19/2018); polypectomy (01/19/2018); DG OPERATIVE LEFT HIP ([facility] HX); Joint replacement (Left, 12/2017); Video bronchoscopy (Left, 01/14/2019); Thoracotomy (Left, 01/14/2019); Flexible bronchoscopy (Bilateral, 01/20/2019); Flexible bronchoscopy (Bilateral, 01/22/2019); Electormagnetic navigation bronchoscopy (Left, 11/18/2018); Lung cancer surgery; Video bronchoscopy with endobronchial ultrasound (N/A, 10/15/2019); Video bronchoscopy with endobronchial navigation (N/A, 10/15/2019); Portacath placement (N/A, 11/11/2019); Colonoscopy with propofol  (N/A, 03/16/2021); and Colonoscopy with propofol  (N/A, 04/06/2021). Family: family history includes Cancer in his father; Diabetes in his sister; Thrombosis in his sister.  Laboratory Chemistry Profile   Renal Lab Results  Component Value Date   BUN 11 03/07/2023   CREATININE 0.84 03/07/2023   BCR 13 03/07/2023   GFRAA >60 12/01/2019   GFRNONAA >60 07/05/2021    Hepatic Lab Results  Component Value Date   AST 25 03/07/2023   ALT 26 03/07/2023   ALBUMIN 4.6 03/07/2023   ALKPHOS 165 (H) 03/07/2023   LIPASE 50 02/17/2019  Electrolytes Lab Results  Component Value Date   NA 142 03/07/2023   K 3.7 03/07/2023   CL 103 03/07/2023   CALCIUM  9.7 03/07/2023   MG 2.4 01/25/2019   PHOS 3.5 01/23/2019    Bone No results found for: "VD25OH", "VD125OH2TOT", "ZO1096EA5", "WU9811BJ4", "25OHVITD1", "25OHVITD2", "25OHVITD3", "TESTOFREE",  "TESTOSTERONE"  Inflammation (CRP: Acute Phase) (ESR: Chronic Phase) Lab Results  Component Value Date   CRP 0.7 04/03/2015   ESRSEDRATE 11 10/21/2017   LATICACIDVEN 1.7 01/16/2019         Note: Above Lab results reviewed.  Recent Imaging Review  CT Chest W Contrast CLINICAL DATA:  Follow-up lung cancer. LEFT upper lobe lobectomy November 2020. Post chemo radiation therapy 2021. * Tracking Code: BO *  EXAM: CT CHEST WITH CONTRAST  TECHNIQUE: Multidetector CT imaging of the chest was performed during intravenous contrast administration.  RADIATION DOSE REDUCTION: This exam was performed according to the departmental dose-optimization program which includes automated exposure control, adjustment of the mA and/or kV according to patient size and/or use of iterative reconstruction technique.  CONTRAST:  75mL OMNIPAQUE  IOHEXOL  300 MG/ML  SOLN  COMPARISON:  Chest CT 05/08/2022  FINDINGS: Cardiovascular: No significant vascular findings. Normal heart size. No pericardial effusion.  Mediastinum/Nodes: No axillary or supraclavicular adenopathy. No mediastinal or hilar adenopathy. No pericardial fluid. Esophagus normal.  Lungs/Pleura: LEFT upper lobectomy anatomy. Mild consolidation about the LEFT hilum with air bronchograms typical of radiation change. No LEFT lung nodularity.  Subpleural nodule in the RIGHT upper lobe posteriorly measures 5 mm (image 81/3) not changed from prior.  Upper Abdomen: Limited view of the liver, kidneys, pancreas are unremarkable. Normal adrenal glands.  Musculoskeletal: No aggressive osseous lesion.  IMPRESSION: 1. LEFT upper lobectomy anatomy. No evidence of local tumor recurrence. 2. Stable radiation change about the LEFT hilum. 3. Stable RIGHT upper lobe pulmonary nodule. 4. No evidence of metastatic adenopathy.  Electronically Signed   By: Deboraha Fallow M.D.   On: 11/27/2022 10:39 Note: Reviewed         Physical Exam   General appearance: Well nourished, well developed, and well hydrated. In no apparent acute distress Mental status: Alert, oriented x 3 (person, place, & time)       Respiratory: No evidence of acute respiratory distress Eyes: PERLA Vitals: There were no vitals taken for this visit. BMI: Estimated body mass index is 31.85 kg/m as calculated from the following:   Height as of 05/07/23: 5\' 10"  (1.778 m).   Weight as of 05/07/23: 222 lb (100.7 kg). Ideal: Patient weight not recorded  Assessment   Diagnosis Status  1. Chronic low back pain (1ry area of Pain) (Bilateral) (L>R)   2. Chronic lower extremity pain (2ry area of Pain) (Left)   3. Chronic upper extremity pain (3ry area of Pain) (Bilateral) (L>R)   4. 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)   5. Squamous cell carcinoma of left lung (HCC)   6. Chronic pain syndrome   7. Pharmacologic therapy   8. Chronic use of opiate for therapeutic purpose   9. Encounter for medication management   10. Encounter for chronic pain management   11. Cancer associated pain   12. Malignant neoplasm of lower lobe of left lung (HCC)    Controlled Controlled Controlled   Updated Problems: No problems updated.  Plan of Care  Problem-specific:  Assessment and Plan            Kristopher [name] has a current medication  list which includes the following long-term medication(s): albuterol , albuterol , amlodipine , atorvastatin , esomeprazole , hydralazine , metformin , metoprolol  succinate, metoprolol  tartrate, montelukast , morphine , nortriptyline , potassium chloride  er, and sumatriptan .  Pharmacotherapy (Medications Ordered): No orders of the defined types were placed in this encounter.  Orders:  No orders of the defined types were placed in this encounter.       No follow-ups on file.    Recent Visits Date Type Provider Dept  05/07/23 Office Visit Renaldo Caroli, MD [facility]  Showing recent visits  within past 90 days and meeting all other requirements Future Appointments Date Type Provider Dept  07/30/23 Appointment Charlean Carneal K, NP [facility]  Showing future appointments within next 90 days and meeting all other requirements  I discussed the assessment and treatment plan with the patient. The patient was provided an opportunity to ask questions and all were answered. The patient agreed with the plan and demonstrated an understanding of the instructions.  Patient advised to call back or seek an in-person evaluation if the symptoms or condition worsens.  Duration of encounter: *** minutes.  Total time on encounter, as per AMA guidelines included both the face-to-face and non-face-to-face time personally spent by the physician and/or other qualified health care professional(s) on the day of the encounter (includes time in activities that require the physician or other qualified health care professional and does not include time in activities normally performed by clinical staff). Physician's time may include the following activities when performed: Preparing to see the patient (e.g., pre-charting review of records, searching for previously ordered imaging, lab work, and nerve conduction tests) Review of prior analgesic pharmacotherapies. Reviewing PMP Interpreting ordered tests (e.g., lab work, imaging, nerve conduction tests) Performing post-procedure evaluations, including interpretation of diagnostic procedures Obtaining and/or reviewing separately obtained history Performing a medically appropriate examination and/or evaluation Counseling and educating the patient/family/caregiver Ordering medications, tests, or procedures Referring and communicating with other health care professionals (when not separately reported) Documenting clinical information in the electronic or other health record Independently interpreting results (not separately reported) and communicating  results to the patient/ family/caregiver Care coordination (not separately reported)  Note by: Teasha Murrillo K Marisela Line, NP (TTS and AI technology used. I apologize for any typographical errors that were not detected and corrected.) Date: 07/30/2023; Time: 2:57 PM

## 2023-07-30 ENCOUNTER — Ambulatory Visit: Attending: Pain Medicine | Admitting: Nurse Practitioner

## 2023-07-30 ENCOUNTER — Encounter: Payer: Self-pay | Admitting: Nurse Practitioner

## 2023-07-30 DIAGNOSIS — C3492 Malignant neoplasm of unspecified part of left bronchus or lung: Secondary | ICD-10-CM | POA: Diagnosis not present

## 2023-07-30 DIAGNOSIS — M79602 Pain in left arm: Secondary | ICD-10-CM | POA: Insufficient documentation

## 2023-07-30 DIAGNOSIS — M79605 Pain in left leg: Secondary | ICD-10-CM | POA: Diagnosis not present

## 2023-07-30 DIAGNOSIS — Z79899 Other long term (current) drug therapy: Secondary | ICD-10-CM

## 2023-07-30 DIAGNOSIS — Z79891 Long term (current) use of opiate analgesic: Secondary | ICD-10-CM

## 2023-07-30 DIAGNOSIS — M5459 Other low back pain: Secondary | ICD-10-CM

## 2023-07-30 DIAGNOSIS — C3432 Malignant neoplasm of lower lobe, left bronchus or lung: Secondary | ICD-10-CM | POA: Diagnosis not present

## 2023-07-30 DIAGNOSIS — M5442 Lumbago with sciatica, left side: Secondary | ICD-10-CM | POA: Insufficient documentation

## 2023-07-30 DIAGNOSIS — M5441 Lumbago with sciatica, right side: Secondary | ICD-10-CM | POA: Insufficient documentation

## 2023-07-30 DIAGNOSIS — G893 Neoplasm related pain (acute) (chronic): Secondary | ICD-10-CM | POA: Diagnosis not present

## 2023-07-30 DIAGNOSIS — G8929 Other chronic pain: Secondary | ICD-10-CM | POA: Diagnosis not present

## 2023-07-30 DIAGNOSIS — M79601 Pain in right arm: Secondary | ICD-10-CM | POA: Diagnosis not present

## 2023-07-30 DIAGNOSIS — G894 Chronic pain syndrome: Secondary | ICD-10-CM

## 2023-07-30 DIAGNOSIS — M961 Postlaminectomy syndrome, not elsewhere classified: Secondary | ICD-10-CM | POA: Diagnosis not present

## 2023-07-30 MED ORDER — MORPHINE SULFATE 15 MG PO TABS: 15.0000 mg | ORAL_TABLET | Freq: Four times a day (QID) | ORAL | 0 refills | Status: DC | PRN

## 2023-07-30 MED ORDER — MORPHINE SULFATE 15 MG PO TABS
15.0000 mg | ORAL_TABLET | Freq: Four times a day (QID) | ORAL | 0 refills | Status: DC | PRN
Start: 2023-10-19 — End: 2023-09-22

## 2023-07-30 MED ORDER — MORPHINE SULFATE 15 MG PO TABS
15.0000 mg | ORAL_TABLET | Freq: Four times a day (QID) | ORAL | 0 refills | Status: DC | PRN
Start: 2023-08-20 — End: 2023-09-22

## 2023-07-30 NOTE — Progress Notes (Signed)
 Nursing Pain Medication Assessment:  Safety precautions to be maintained throughout the outpatient stay will include: orient to surroundings, keep bed in low position, maintain call bell within reach at all times, provide assistance with transfer out of bed and ambulation.  Medication Inspection Compliance: Pill count conducted under aseptic conditions, in front of the patient. Neither the pills nor the bottle was removed from the patient's sight at any time. Once count was completed pills were immediately returned to the patient in their original bottle.  Medication: Morphine  IR Pill/Patch Count: 85 of 120 pills/patches remain Pill/Patch Appearance: Markings consistent with prescribed medication Bottle Appearance: Standard pharmacy container. Clearly labeled. Filled Date: 5 / 49 / 2025 Last Medication intake:  Today

## 2023-08-05 ENCOUNTER — Other Ambulatory Visit: Payer: Self-pay | Admitting: Family Medicine

## 2023-08-06 NOTE — Telephone Encounter (Signed)
 Requested Prescriptions  Pending Prescriptions Disp Refills   ACCU-CHEK GUIDE TEST test strip [Pharmacy Med Name: ACCU-CHEK GUIDE TEST STRIPS 100S] 100 strip 3    Sig: USE TO TEST BLOOD SUGAR TWICE DAILY     There is no refill protocol information for this order

## 2023-08-13 DIAGNOSIS — H04321 Acute dacryocystitis of right lacrimal passage: Secondary | ICD-10-CM | POA: Diagnosis not present

## 2023-08-30 ENCOUNTER — Other Ambulatory Visit: Payer: Self-pay | Admitting: Family Medicine

## 2023-09-01 NOTE — Telephone Encounter (Signed)
 Requested Prescriptions  Pending Prescriptions Disp Refills   lidocaine  (LIDODERM ) 5 % [Pharmacy Med Name: LIDOCAINE  5% PATCH] 28 patch 2    Sig: UNWRAP AND APPLY 1 PATCH TOPICALLY ONTO THE SKIN DAILY. REMOVE AND DISCARD PATCH WITHIN 12 HOURS OR AS DIRECTED     Analgesics:  Topicals Failed - 09/01/2023  3:22 PM      Failed - Manual Review: Labs are only required if the patient has taken medication for more than 8 weeks.      Failed - Valid encounter within last 12 months    Recent Outpatient Visits   None     Future Appointments             In 3 days Vicci, [name], DO [place] [name]            Passed - PLT in normal range and within 360 days    Platelets  Date Value Ref Range Status  03/07/2023 194 150 - 450 x10E3/uL Final         Passed - HGB in normal range and within 360 days    Hemoglobin  Date Value Ref Range Status  03/07/2023 14.4 13.0 - 17.7 g/dL Final         Passed - HCT in normal range and within 360 days    Hematocrit  Date Value Ref Range Status  03/07/2023 45.8 37.5 - 51.0 % Final         Passed - Cr in normal range and within 360 days    Creatinine, Ser  Date Value Ref Range Status  03/07/2023 0.84 0.76 - 1.27 mg/dL Final         Passed - eGFR is 30 or above and within 360 days    GFR calc Af Amer  Date Value Ref Range Status  12/01/2019 >60 >60 mL/min Final   GFR, Estimated  Date Value Ref Range Status  07/05/2021 >60 >60 mL/min Final    Comment:    (NOTE) Calculated using the CKD-EPI Creatinine Equation (2021)    eGFR  Date Value Ref Range Status  03/07/2023 99 >59 mL/min/1.73 Final         Passed - Patient is not pregnant

## 2023-09-02 ENCOUNTER — Ambulatory Visit: Payer: Self-pay | Admitting: Emergency Medicine

## 2023-09-02 VITALS — Ht 70.0 in | Wt 205.0 lb

## 2023-09-02 DIAGNOSIS — Z0000 Encounter for general adult medical examination without abnormal findings: Secondary | ICD-10-CM

## 2023-09-02 NOTE — Progress Notes (Signed)
 Subjective:   Kristopher [name] is a 63 y.o. who presents for a Medicare Wellness preventive visit.  As a reminder, Annual Wellness Visits don't include a physical exam, and some assessments may be limited, especially if this visit is performed virtually. We may recommend an in-person follow-up visit with your provider if needed.  Visit Complete: Virtual I connected with  Kristopher [name] on 09/02/23 by a audio enabled telemedicine application and verified that I am speaking with the correct person using two identifiers.  Patient Location: Home  Provider Location: Office/Clinic  I discussed the limitations of evaluation and management by telemedicine. The patient expressed understanding and agreed to proceed.  Vital Signs: Because this visit was a virtual/telehealth visit, some criteria may be missing or patient reported. Any vitals not documented were not able to be obtained and vitals that have been documented are patient reported.  VideoDeclined- This patient declined Librarian, academic. Therefore the visit was completed with audio only.  Persons Participating in Visit: Patient.  AWV Questionnaire: No: Patient Medicare AWV questionnaire was not completed prior to this visit.  Cardiac Risk Factors include: advanced age (>27men, >69 women);male gender;dyslipidemia;diabetes mellitus;Other (see comment), Risk factor comments: OSA (no cpap)     Objective:    Today's Vitals   09/02/23 1547  Weight: 205 lb (93 kg)  Height: 5' 10 (1.778 m)  PainSc: 3    Body mass index is 29.41 kg/m.     09/02/2023    4:04 PM 07/30/2023    8:17 AM 05/07/2023    8:46 AM 02/03/2023    8:59 AM 12/03/2022    9:20 AM 08/27/2022    8:29 AM 08/07/2022    8:22 AM  Advanced Directives  Does Patient Have a Medical Advance Directive? Yes Yes No No Yes Yes Yes  Type of Estate agent of [place];Living will    Healthcare Power of [place];Living will Healthcare  Power of [place];Living will   Does patient want to make changes to medical advance directive? No - Patient declined        Copy of Healthcare Power of Attorney in Chart? No - copy requested     No - copy requested   Would patient like information on creating a medical advance directive?   No - Patient declined        Current Medications (verified) Outpatient Encounter Medications as of 09/02/2023  Medication Sig   Accu-Chek FastClix Lancets MISC USE TO TEST BLOOD SUGAR 2X A DAY   ACCU-CHEK GUIDE TEST test strip USE TO TEST BLOOD SUGAR TWICE DAILY   albuterol  (PROVENTIL ) (2.5 MG/3ML) 0.083% nebulizer solution USE 1 VIAL VIA NEBULIZER EVERY 6 HOURS AS NEEDED FOR WHEEZING OR SHORTNESS OF BREATH   albuterol  (VENTOLIN  HFA) 108 (90 Base) MCG/ACT inhaler Inhale into the lungs.   amLODipine  (NORVASC ) 5 MG tablet TAKE 1 TABLET(5 MG) BY MOUTH TWICE DAILY   atorvastatin  (LIPITOR) 10 MG tablet Take 1 tablet (10 mg total) by mouth daily at 6 PM.   BREZTRI  AEROSPHERE 160-9-4.8 MCG/ACT AERO Inhale 2 puffs into the lungs 2 (two) times daily. Maintenance per pulmonology   diphenhydrAMINE  (BENADRYL ) 25 MG tablet Take 75 mg by mouth in the morning and at bedtime.   empagliflozin  (JARDIANCE ) 25 MG TABS tablet TAKE 1 TABLET(25 MG) BY MOUTH DAILY BEFORE BREAKFAST   esomeprazole  (NEXIUM ) 40 MG capsule TAKE 1 CAPSULE(40 MG) BY MOUTH DAILY   hydrALAZINE  (APRESOLINE ) 100 MG tablet Take 1 tablet (100 mg total)  by mouth 2 (two) times daily.   lidocaine  (LIDODERM ) 5 % UNWRAP AND APPLY 1 PATCH TOPICALLY ONTO THE SKIN DAILY. REMOVE AND DISCARD PATCH WITHIN 12 HOURS OR AS DIRECTED   metFORMIN  (GLUCOPHAGE ) 500 MG tablet TAKE 2 TABLETS(1000 MG) BY MOUTH TWICE DAILY WITH A MEAL   metoprolol  succinate (TOPROL -XL) 100 MG 24 hr tablet Take 1 tablet (100 mg total) by mouth 2 (two) times daily. Take with or immediately following a meal. To be taken with 50mg  for 150mg  BID   metoprolol  tartrate (LOPRESSOR ) 50 MG tablet TAKE 1 TABLET  BY MOUTH TWICE DAILY. TO BE TAKEN WITH 100 MG METOPROLOL    montelukast  (SINGULAIR ) 10 MG tablet TAKE 1 TABLET(10 MG) BY MOUTH DAILY   morphine  (MSIR) 15 MG tablet Take 1 tablet (15 mg total) by mouth every 6 (six) hours as needed for moderate pain (pain score 4-6) or severe pain (pain score 7-10). Must last 30 days.   Multiple Vitamin (MULTIVITAMIN WITH MINERALS) TABS tablet Take 1 tablet by mouth daily.   naloxone  (NARCAN ) nasal spray 4 mg/0.1 mL Place 1 spray into the nose as needed for up to 365 doses (for opioid-induced respiratory depresssion). In case of emergency (overdose), spray once into each nostril. If no response within 3 minutes, repeat application and call 911.   nortriptyline  (PAMELOR ) 25 MG capsule TAKE 2 CAPSULES(50 MG) BY MOUTH AT BEDTIME   Potassium Chloride  ER 20 MEQ TBCR TAKE 1 TABLET(20 MEQ) BY MOUTH DAILY   Semaglutide  (RYBELSUS ) 14 MG TABS Take 1 tablet (14 mg total) by mouth daily.   STIOLTO RESPIMAT  2.5-2.5 MCG/ACT AERS Inhale 2 puffs into the lungs 4 (four) times daily as needed. Prn per pulmonology notes   SUMAtriptan  (IMITREX ) 50 MG tablet Take 1 tablet (50 mg total) by mouth every 2 (two) hours as needed for migraine. Take 1 tab at onset of migraine. May repeat in 2 hours if headache persists or recurs.   [START ON 09/19/2023] morphine  (MSIR) 15 MG tablet Take 1 tablet (15 mg total) by mouth every 6 (six) hours as needed for moderate pain (pain score 4-6) or severe pain (pain score 7-10). Must last 30 days.   [START ON 10/19/2023] morphine  (MSIR) 15 MG tablet Take 1 tablet (15 mg total) by mouth every 6 (six) hours as needed for moderate pain (pain score 4-6) or severe pain (pain score 7-10). Must last 30 days.   Facility-Administered Encounter Medications as of 09/02/2023  Medication   sodium chloride  flush (NS) 0.9 % injection 10 mL    Allergies (verified) Acetaminophen , Aspirin, Epinephrine, Novocain [procaine], Penicillins, Strawberry extract, and Shellfish allergy    History: Past Medical History:  Diagnosis Date   Allergy    Arthritis    left foot   Benign hypertensive kidney disease    Chronic back pain    Four rods in back   Diabetes mellitus, type 2 (HCC)    Dyspnea    GERD (gastroesophageal reflux disease)    Hypertension    Malignant neoplasm of lung (HCC)    Migraines    daily   Past Surgical History:  Procedure Laterality Date   APPENDECTOMY     BACK SURGERY     COLONOSCOPY WITH PROPOFOL  N/A 02/19/2016   Procedure: COLONOSCOPY WITH PROPOFOL ;  Surgeon: Rogelia Copping, MD;  Location: [facility];  Service: Endoscopy;  Laterality: N/A;   COLONOSCOPY WITH PROPOFOL  N/A 01/19/2018   Procedure: COLONOSCOPY WITH PROPOFOL ;  Surgeon: Copping Rogelia, MD;  Location: [facility];  Service: Endoscopy;  Laterality: N/A;  Diabetic - oral meds   COLONOSCOPY WITH PROPOFOL  N/A 03/16/2021   Procedure: COLONOSCOPY WITH PROPOFOL ;  Surgeon: Jinny Carmine, MD;  Location: [facility];  Service: Endoscopy;  Laterality: N/A;  INADEQUATE PREP   COLONOSCOPY WITH PROPOFOL  N/A 04/06/2021   Procedure: COLONOSCOPY WITH PROPOFOL ;  Surgeon: Jinny Carmine, MD;  Location: [facility];  Service: Endoscopy;  Laterality: N/A;   DG OPERATIVE LEFT HIP ([facility] HX)     10/19   ELECTROMAGNETIC NAVIGATION BROCHOSCOPY Left 11/18/2018   Procedure: ELECTROMAGNETIC NAVIGATION BRONCHOSCOPY;  Surgeon: Tamea Dedra CROME, MD;  Location: [facility] ORS;  Service: Cardiopulmonary;  Laterality: Left;   FLEXIBLE BRONCHOSCOPY Bilateral 01/20/2019   Procedure: FLEXIBLE BRONCHOSCOPY;  Surgeon: Parris Manna, MD;  Location: [facility] OR[facility];  Service: Thoracic;  Laterality: Bilateral;   FLEXIBLE BRONCHOSCOPY Bilateral 01/22/2019   Procedure: FLEXIBLE BRONCHOSCOPY;  Surgeon: Parris Manna, MD;  Location: [facility] OR[facility];  Service: Thoracic;  Laterality: Bilateral;   FOOT SURGERY Left    Screws and plates   JOINT REPLACEMENT Left 12/2017   DR Kathlynn Hip   KNEE SURGERY Left    X 2   LEG  SURGERY     LUNG CANCER SURGERY     POLYPECTOMY N/A 02/19/2016   Procedure: POLYPECTOMY;  Surgeon: Carmine Jinny, MD;  Location: [facility];  Service: Endoscopy;  Laterality: N/A;   POLYPECTOMY  01/19/2018   Procedure: POLYPECTOMY;  Surgeon: Jinny Carmine, MD;  Location: [facility];  Service: Endoscopy;;   PORTACATH PLACEMENT N/A 11/11/2019   Procedure: INSERTION PORT-A-CATH;  Surgeon: Volney Lye, MD;  Location: [facility] OR[facility];  Service: General;  Laterality: N/A;   THORACOTOMY Left 01/14/2019   Procedure: THORACOTOMY MAJOR, LEFT;  Surgeon: Volney Lye, MD;  Location: [facility];  Service: General;  Laterality: Left;   TOTAL HIP ARTHROPLASTY Left 12/02/2017   Procedure: TOTAL HIP ARTHROPLASTY ANTERIOR APPROACH;  Surgeon: Kathlynn Sharper, MD;  Location: [facility] OR[facility];  Service: Orthopedics;  Laterality: Left;   VIDEO BRONCHOSCOPY Left 01/14/2019   Procedure: VIDEO BRONCHOSCOPY WITH FLUORO, LEFT;  Surgeon: Volney Lye, MD;  Location: [facility];  Service: General;  Laterality: Left;   VIDEO BRONCHOSCOPY WITH ENDOBRONCHIAL NAVIGATION N/A 10/15/2019   Procedure: VIDEO BRONCHOSCOPY WITH ENDOBRONCHIAL NAVIGATION;  Surgeon: Parris Manna, MD;  Location: [facility] OR[facility];  Service: Thoracic;  Laterality: N/A;   VIDEO BRONCHOSCOPY WITH ENDOBRONCHIAL ULTRASOUND N/A 10/15/2019   Procedure: VIDEO BRONCHOSCOPY WITH ENDOBRONCHIAL ULTRASOUND;  Surgeon: Parris Manna, MD;  Location: [facility] OR[facility];  Service: Thoracic;  Laterality: N/A;   Family History  Problem Relation Age of Onset   Heart disease Mother    Cancer Father    Diabetes Sister    Thrombosis Sister    Social History   Socioeconomic History   Marital status: Married    Spouse name: Newell   Number of children: 1   Years of education: Not on file   Highest education level: Not on file  Occupational History   Occupation: disability  Tobacco Use   Smoking status: Former    Current packs/day: 0.00    Average packs/day: 0.3 packs/day for 35.0  years (8.8 ttl pk-yrs)    Types: Cigarettes    Start date: 01/14/1984    Quit date: 01/14/2019    Years since quitting: 4.6   Smokeless tobacco: Never  Vaping Use   Vaping status: Never Used  Substance and Sexual Activity   Alcohol use: No    Alcohol/week: 0.0 standard drinks of alcohol   Drug  use: Yes    Types: Morphine     Comment: as prescribed by pain management   Sexual activity: Yes  Other Topics Concern   Not on file  Social History Narrative   Not on file   Social Drivers of Health   Financial Resource Strain: Low Risk  (09/02/2023)   Overall Financial Resource Strain (CARDIA)    Difficulty of Paying Living Expenses: Not hard at all  Food Insecurity: No Food Insecurity (09/02/2023)   Hunger Vital Sign    Worried About Running Out of Food in the Last Year: Never true    Ran Out of Food in the Last Year: Never true  Transportation Needs: No Transportation Needs (09/02/2023)   PRAPARE - Administrator, Civil Service (Medical): No    Lack of Transportation (Non-Medical): No  Physical Activity: Sufficiently Active (09/02/2023)   Exercise Vital Sign    Days of Exercise per Week: 4 days    Minutes of Exercise per Session: 90 min  Stress: No Stress Concern Present (09/02/2023)   [employer] of Occupational Health - Occupational Stress Questionnaire    Feeling of Stress: Not at all  Social Connections: Moderately Isolated (09/02/2023)   Social Connection and Isolation Panel    Frequency of Communication with Friends and Family: More than three times a week    Frequency of Social Gatherings with Friends and Family: More than three times a week    Attends Religious Services: Never    Database administrator or Organizations: No    Attends Engineer, structural: Never    Marital Status: Married    Tobacco Counseling Counseling given: Not Answered    Clinical Intake:  Pre-visit preparation completed: Yes  Pain : 0-10 Pain Score: 3  Pain Type: Chronic  pain Pain Location: Back Pain Descriptors / Indicators: Aching     BMI - recorded: 29.41 Nutritional Status: BMI 25 -29 Overweight Nutritional Risks: None Diabetes: Yes CBG done?: No (FBS 120 per patient) Did pt. bring in CBG monitor from home?: No  Lab Results  Component Value Date   HGBA1C 6.3 (H) 03/07/2023   HGBA1C 6.7 (H) 12/05/2022   HGBA1C 6.5 (H) 09/03/2022     How often do you need to have someone help you when you read instructions, pamphlets, or other written materials from your doctor or pharmacy?: 1 - Never  Interpreter Needed?: No  Information entered by :: Vina Ned, CMA   Activities of Daily Living     09/02/2023    3:50 PM  In your present state of health, do you have any difficulty performing the following activities:  Hearing? 0  Vision? 0  Difficulty concentrating or making decisions? 0  Walking or climbing stairs? 0  Dressing or bathing? 0  Doing errands, shopping? 0  Preparing Food and eating ? N  Using the Toilet? N  In the past six months, have you accidently leaked urine? N  Do you have problems with loss of bowel control? N  Managing your Medications? Y  Comment wife fills pill box  Managing your Finances? Y  Comment wife Neurosurgeon or managing your Housekeeping? N    Patient Care Team: Vicci Duwaine SQUIBB, DO as PCP - General (Family Medicine) Verdene Gills, RN as Registered Nurse Tanya Glisson, MD as Referring Physician (Pain Medicine) Jacobo Evalene PARAS, MD as Consulting Physician (Oncology) Lenn Aran, MD as Consulting Physician (Radiation Oncology) Trevor Haskell, [place] (Optometry) Patel, Seema K, NP as Nurse  Practitioner (Pain Medicine) Isaiah Scrivener, MD as Consulting Physician (Pulmonary Disease)  I have updated your Care Teams any recent Medical Services you may have received from other providers in the past year.     Assessment:   This is a routine wellness examination for  [place].  Hearing/Vision screen Hearing Screening - Comments:: Denies hearing loss Vision Screening - Comments:: Gets DM eye exams, Dr. Fleeta Ghee, [facility] [place]   Goals Addressed             This Visit's Progress    Patient Stated       Not to have to take so much medication     COMPLETED: Quit Smoking       Smoking cessation discussed       Depression Screen     09/02/2023    4:01 PM 07/30/2023    8:17 AM 05/07/2023    8:46 AM 02/03/2023    8:59 AM 09/03/2022    9:41 AM 08/27/2022    8:27 AM 08/07/2022    8:22 AM  PHQ 2/9 Scores  PHQ - 2 Score 0 0 0 0 0 0 0  PHQ- 9 Score 0    0 0     Fall Risk     09/02/2023    4:06 PM 07/30/2023    8:17 AM 05/07/2023    8:46 AM 02/03/2023    8:58 AM 11/06/2022    8:21 AM  Fall Risk   Falls in the past year? 0 0 0  0  Number falls in past yr: 0   0   Injury with Fall? 0   0   Risk for fall due to : No Fall Risks      Follow up Falls evaluation completed        MEDICARE RISK AT HOME:  Medicare Risk at Home Any stairs in or around the home?: No (has ramps) If so, are there any without handrails?: No Home free of loose throw rugs in walkways, pet beds, electrical cords, etc?: Yes Adequate lighting in your home to reduce risk of falls?: Yes Life alert?: No Use of a cane, walker or w/c?: No Grab bars in the bathroom?: No Shower chair or bench in shower?: No Elevated toilet seat or a handicapped toilet?: Yes  TIMED UP AND GO:  Was the test performed?  No  Cognitive Function: 6CIT completed        09/02/2023    4:07 PM 08/27/2022    8:31 AM 08/20/2021    8:34 AM 08/18/2020    8:24 AM 08/04/2017    8:12 AM  6CIT Screen  What Year? 0 points 0 points 0 points 0 points 0 points  What month? 0 points 0 points 0 points 0 points 0 points  What time? 3 points 0 points 0 points 0 points 0 points  Count back from 20 2 points 2 points 2 points 0 points 0 points  Months in reverse 4 points 4 points 4 points 4 points 0 points   Repeat phrase 2 points 2 points 2 points 6 points 2 points  Total Score 11 points 8 points 8 points 10 points 2 points    Immunizations Immunization History  Administered Date(s) Administered   Pneumococcal Polysaccharide-23 06/24/2016   Tdap 03/04/2013, 06/03/2017    Screening Tests Health Maintenance  Topic Date Due   Zoster Vaccines- Shingrix (1 of 2) Never done   HEMOGLOBIN A1C  09/04/2023   INFLUENZA VACCINE  10/03/2023  FOOT EXAM  12/05/2023   OPHTHALMOLOGY EXAM  12/12/2023   Diabetic kidney evaluation - eGFR measurement  03/06/2024   Diabetic kidney evaluation - Urine ACR  03/06/2024   Medicare Annual Wellness (AWV)  09/01/2024   Colonoscopy  04/06/2026   DTaP/Tdap/Td (3 - Td or Tdap) 06/04/2027   Hepatitis C Screening  Completed   HIV Screening  Completed   Hepatitis B Vaccines  Aged Out   HPV VACCINES  Aged Out   Meningococcal B Vaccine  Aged Out   Pneumococcal Vaccine 33-83 Years old  Discontinued   COVID-19 Vaccine  Discontinued    Health Maintenance  Health Maintenance Due  Topic Date Due   Zoster Vaccines- Shingrix (1 of 2) Never done   Health Maintenance Items Addressed: See Nurse Notes at the end of this note  Additional Screening:  Vision Screening: Recommended annual ophthalmology exams for early detection of glaucoma and other disorders of the eye. Would you like a referral to an eye doctor? No    Dental Screening: Recommended annual dental exams for proper oral hygiene  Community Resource Referral / Chronic Care Management: CRR required this visit?  No   CCM required this visit?  No   Plan:    I have personally reviewed and noted the following in the patient's chart:   Medical and social history Use of alcohol, tobacco or illicit drugs  Current medications and supplements including opioid prescriptions. Patient is currently taking opioid prescriptions. Information provided to patient regarding non-opioid alternatives. Patient advised  to discuss non-opioid treatment plan with their provider. Functional ability and status Nutritional status Physical activity Advanced directives List of other physicians Hospitalizations, surgeries, and ER visits in previous 12 months Vitals Screenings to include cognitive, depression, and falls Referrals and appointments  In addition, I have reviewed and discussed with patient certain preventive protocols, quality metrics, and best practice recommendations. A written personalized care plan for preventive services as well as general preventive health recommendations were provided to patient.   Vina Ned, CMA   09/02/2023   After Visit Summary: (Declined) Due to this being a telephonic visit, with patients personalized plan was offered to patient but patient Declined AVS at this time   Notes:  6 CIT Score - 11 FBS this morning 120 per patient Declined Pneumonia, Covid and shingles vaccines Declined DM & Nutrition education referral

## 2023-09-02 NOTE — Patient Instructions (Signed)
 [name] , Thank you for taking time out of your busy schedule to complete your Annual Wellness Visit with me. I enjoyed our conversation and look forward to speaking with you again next year. I, as well as your care team,  appreciate your ongoing commitment to your health goals. Please review the following plan we discussed and let me know if I can assist you in the future. Your Game plan/ To Do List    Referrals: None   Follow up Visits: Next Medicare AWV with our clinical staff: 09/07/24 @ 3:50pm (PHONE VISIT)   Have you seen your provider in the last 6 months (3 months if uncontrolled diabetes)? Yes Next Office Visit with your provider: 09/04/23 @ 9:20am with Dr. Vicci  Clinician Recommendations:  Aim for 30 minutes of exercise or brisk walking, 6-8 glasses of water , and 5 servings of fruits and vegetables each day.       This is a list of the screening recommended for you and due dates:  Health Maintenance  Topic Date Due   Zoster (Shingles) Vaccine (1 of 2) Never done   Hemoglobin A1C  09/04/2023   Flu Shot  10/03/2023   Complete foot exam   12/05/2023   Eye exam for diabetics  12/12/2023   Yearly kidney function blood test for diabetes  03/06/2024   Yearly kidney health urinalysis for diabetes  03/06/2024   Medicare Annual Wellness Visit  09/01/2024   Colon Cancer Screening  04/06/2026   DTaP/Tdap/Td vaccine (3 - Td or Tdap) 06/04/2027   Hepatitis C Screening  Completed   HIV Screening  Completed   Hepatitis B Vaccine  Aged Out   HPV Vaccine  Aged Out   Meningitis B Vaccine  Aged Out   Pneumococcal Vaccination  Discontinued   COVID-19 Vaccine  Discontinued    Advanced directives: (Copy Requested) Please bring a copy of your health care power of attorney and living will to the office to be added to your chart at your convenience. You can mail to [facility] [address] [place], [place] [address] or email to [email or web address] Advance Care  Planning is important because it:  [x]  Makes sure you receive the medical care that is consistent with your values, goals, and preferences  [x]  It provides guidance to your family and loved ones and reduces their decisional burden about whether or not they are making the right decisions based on your wishes.  Follow the link provided in your after visit summary or read over the paperwork we have mailed to you to help you started getting your Advance Directives in place. If you need assistance in completing these, please reach out to us  so that we can help you!  See attachments for Preventive Care and Fall Prevention Tips.   Fall Prevention in the Home, Adult Falls can cause injuries and affect people of all ages. There are many simple things that you can do to make your home safe and to help prevent falls. If you need it, ask for help making these changes. What actions can I take to prevent falls? General information Use good lighting in all rooms. Make sure to: Replace any light bulbs that burn out. Turn on lights if it is dark and use night-lights. Keep items that you use often in easy-to-reach places. Lower the shelves around your home if needed. Move furniture so that there are clear paths around it. Do not keep throw rugs or other things on the floor  that can make you trip. If any of your floors are uneven, fix them. Add color or contrast paint or tape to clearly mark and help you see: Grab bars or handrails. First and last steps of staircases. Where the edge of each step is. If you use a ladder or stepladder: Make sure that it is fully opened. Do not climb a closed ladder. Make sure the sides of the ladder are locked in place. Have someone hold the ladder while you use it. Know where your pets are as you move through your home. What can I do in the bathroom?     Keep the floor dry. Clean up any water  that is on the floor right away. Remove soap buildup in the bathtub or  shower. Buildup makes bathtubs and showers slippery. Use non-skid mats or decals on the floor of the bathtub or shower. Attach bath mats securely with double-sided, non-slip rug tape. If you need to sit down while you are in the shower, use a non-slip stool. Install grab bars by the toilet and in the bathtub and shower. Do not use towel bars as grab bars. What can I do in the bedroom? Make sure that you have a light by your bed that is easy to reach. Do not use any sheets or blankets on your bed that hang to the floor. Have a firm bench or chair with side arms that you can use for support when you get dressed. What can I do in the kitchen? Clean up any spills right away. If you need to reach something above you, use a sturdy step stool that has a grab bar. Keep electrical cables out of the way. Do not use floor polish or wax that makes floors slippery. What can I do with my stairs? Do not leave anything on the stairs. Make sure that you have a light switch at the top and the bottom of the stairs. Have them installed if you do not have them. Make sure that there are handrails on both sides of the stairs. Fix handrails that are broken or loose. Make sure that handrails are as long as the staircases. Install non-slip stair treads on all stairs in your home if they do not have carpet. Avoid having throw rugs at the top or bottom of stairs, or secure the rugs with carpet tape to prevent them from moving. Choose a carpet design that does not hide the edge of steps on the stairs. Make sure that carpet is firmly attached to the stairs. Fix any carpet that is loose or worn. What can I do on the outside of my home? Use bright outdoor lighting. Repair the edges of walkways and driveways and fix any cracks. Clear paths of anything that can make you trip, such as tools or rocks. Add color or contrast paint or tape to clearly mark and help you see high doorway thresholds. Trim any bushes or trees on the  main path into your home. Check that handrails are securely fastened and in good repair. Both sides of all steps should have handrails. Install guardrails along the edges of any raised decks or porches. Have leaves, snow, and ice cleared regularly. Use sand, salt, or ice melt on walkways during winter months if you live where there is ice and snow. In the garage, clean up any spills right away, including grease or oil spills. What other actions can I take? Review your medicines with your health care provider. Some medicines can make you confused  or feel dizzy. This can increase your chance of falling. Wear closed-toe shoes that fit well and support your feet. Wear shoes that have rubber soles and low heels. Use a cane, walker, scooter, or crutches that help you move around if needed. Talk with your provider about other ways that you can decrease your risk of falls. This may include seeing a physical therapist to learn to do exercises to improve movement and strength. Where to find more information Centers for Disease Control and Prevention, STEADI: TonerPromos.no [employer] on Aging: BaseRingTones.pl National Institute on Aging: BaseRingTones.pl Contact a health care provider if: You are afraid of falling at home. You feel weak, drowsy, or dizzy at home. You fall at home. Get help right away if you: Lose consciousness or have trouble moving after a fall. Have a fall that causes a head injury. These symptoms may be an emergency. Get help right away. Call 911. Do not wait to see if the symptoms will go away. Do not drive yourself to the hospital. This information is not intended to replace advice given to you by your health care provider. Make sure you discuss any questions you have with your health care provider. Document Revised: 10/22/2021 Document Reviewed: 10/22/2021 Elsevier Patient Education  2024 Elsevier Inc.  Managing Pain Without Opioids Opioids are strong medicines used to treat  moderate to severe pain. For some people, especially those who have long-term (chronic) pain, opioids may not be the best choice for pain management due to: Side effects like nausea, constipation, and sleepiness. The risk of addiction (opioid use disorder). The longer you take opioids, the greater your risk of addiction. Pain that lasts for more than 3 months is called chronic pain. Managing chronic pain usually requires more than one approach and is often provided by a team of health care providers working together (multidisciplinary approach). Pain management may be done at a pain management center or pain clinic. How to manage pain without the use of opioids Use non-opioid medicines Non-opioid medicines for pain may include: Over-the-counter or prescription non-steroidal anti-inflammatory drugs (NSAIDs). These may be the first medicines used for pain. They work well for muscle and bone pain, and they reduce swelling. Acetaminophen . This over-the-counter medicine may work well for milder pain but not swelling. Antidepressants. These may be used to treat chronic pain. A certain type of antidepressant (tricyclics) is often used. These medicines are given in lower doses for pain than when used for depression. Anticonvulsants. These are usually used to treat seizures but may also reduce nerve (neuropathic) pain. Muscle relaxants. These relieve pain caused by sudden muscle tightening (spasms). You may also use a pain medicine that is applied to the skin as a patch, cream, or gel (topical analgesic), such as a numbing medicine. These may cause fewer side effects than medicines taken by mouth. Do certain therapies as directed Some therapies can help with pain management. They include: Physical therapy. You will do exercises to gain strength and flexibility. A physical therapist may teach you exercises to move and stretch parts of your body that are weak, stiff, or painful. You can learn these exercises at  physical therapy visits and practice them at home. Physical therapy may also involve: Massage. Heat wraps or applying heat or cold to affected areas. Electrical signals that interrupt pain signals (transcutaneous electrical nerve stimulation, TENS). Weak lasers that reduce pain and swelling (low-level laser therapy). Signals from your body that help you learn to regulate pain (biofeedback). Occupational therapy. This helps you  to learn ways to function at home and work with less pain. Recreational therapy. This involves trying new activities or hobbies, such as a physical activity or drawing. Mental health therapy, including: Cognitive behavioral therapy (CBT). This helps you learn coping skills for dealing with pain. Acceptance and commitment therapy (ACT) to change the way you think and react to pain. Relaxation therapies, including muscle relaxation exercises and mindfulness-based stress reduction. Pain management counseling. This may be individual, family, or group counseling.  Receive medical treatments Medical treatments for pain management include: Nerve block injections. These may include a pain blocker and anti-inflammatory medicines. You may have injections: Near the spine to relieve chronic back or neck pain. Into joints to relieve back or joint pain. Into nerve areas that supply a painful area to relieve body pain. Into muscles (trigger point injections) to relieve some painful muscle conditions. A medical device placed near your spine to help block pain signals and relieve nerve pain or chronic back pain (spinal cord stimulation device). Acupuncture. Follow these instructions at home Medicines Take over-the-counter and prescription medicines only as told by your health care provider. If you are taking pain medicine, ask your health care providers about possible side effects to watch out for. Do not drive or use heavy machinery while taking prescription opioid pain  medicine. Lifestyle  Do not use drugs or alcohol to reduce pain. If you drink alcohol, limit how much you have to: 0-1 drink a day for women who are not pregnant. 0-2 drinks a day for men. Know how much alcohol is in a drink. In the [place] one drink equals one 12 oz bottle of beer (355 mL), one 5 oz glass of wine (148 mL), or one 1 oz glass of hard liquor (44 mL). Do not use any products that contain nicotine  or tobacco. These products include cigarettes, chewing tobacco, and vaping devices, such as e-cigarettes. If you need help quitting, ask your health care provider. Eat a healthy diet and maintain a healthy weight. Poor diet and excess weight may make pain worse. Eat foods that are high in fiber. These include fresh fruits and vegetables, whole grains, and beans. Limit foods that are high in fat and processed sugars, such as fried and sweet foods. Exercise regularly. Exercise lowers stress and may help relieve pain. Ask your health care provider what activities and exercises are safe for you. If your health care provider approves, join an exercise class that combines movement and stress reduction. Examples include yoga and tai chi. Get enough sleep. Lack of sleep may make pain worse. Lower stress as much as possible. Practice stress reduction techniques as told by your therapist. General instructions Work with all your pain management providers to find the treatments that work best for you. You are an important member of your pain management team. There are many things you can do to reduce pain on your own. Consider joining an online or in-person support group for people who have chronic pain. Keep all follow-up visits. This is important. Where to find more information You can find more information about managing pain without opioids from: American Academy of Pain Medicine: painmed.org Institute for Chronic Pain: instituteforchronicpain.org American Chronic Pain Association:  theacpa.org Contact a health care provider if: You have side effects from pain medicine. Your pain gets worse or does not get better with treatments or home therapy. You are struggling with anxiety or depression. Summary Many types of pain can be managed without opioids. Chronic pain may respond better  to pain management without opioids. Pain is best managed when you and a team of health care providers work together. Pain management without opioids may include non-opioid medicines, medical treatments, physical therapy, mental health therapy, and lifestyle changes. Tell your health care providers if your pain gets worse or is not being managed well enough. This information is not intended to replace advice given to you by your health care provider. Make sure you discuss any questions you have with your health care provider. Document Revised: 05/31/2020 Document Reviewed: 05/31/2020 Elsevier Patient Education  2024 [employer].

## 2023-09-04 ENCOUNTER — Ambulatory Visit: Payer: Self-pay | Admitting: Family Medicine

## 2023-09-04 ENCOUNTER — Encounter: Payer: Self-pay | Admitting: Family Medicine

## 2023-09-04 VITALS — BP 130/78 | HR 86 | Temp 97.9°F | Wt 221.8 lb

## 2023-09-04 DIAGNOSIS — E1122 Type 2 diabetes mellitus with diabetic chronic kidney disease: Secondary | ICD-10-CM

## 2023-09-04 DIAGNOSIS — G894 Chronic pain syndrome: Secondary | ICD-10-CM | POA: Diagnosis not present

## 2023-09-04 DIAGNOSIS — I700 Atherosclerosis of aorta: Secondary | ICD-10-CM

## 2023-09-04 DIAGNOSIS — E785 Hyperlipidemia, unspecified: Secondary | ICD-10-CM

## 2023-09-04 DIAGNOSIS — J439 Emphysema, unspecified: Secondary | ICD-10-CM

## 2023-09-04 DIAGNOSIS — I129 Hypertensive chronic kidney disease with stage 1 through stage 4 chronic kidney disease, or unspecified chronic kidney disease: Secondary | ICD-10-CM | POA: Diagnosis not present

## 2023-09-04 DIAGNOSIS — E1169 Type 2 diabetes mellitus with other specified complication: Secondary | ICD-10-CM

## 2023-09-04 DIAGNOSIS — N181 Chronic kidney disease, stage 1: Secondary | ICD-10-CM | POA: Diagnosis not present

## 2023-09-04 DIAGNOSIS — F1120 Opioid dependence, uncomplicated: Secondary | ICD-10-CM

## 2023-09-04 LAB — BAYER DCA HB A1C WAIVED: HB A1C (BAYER DCA - WAIVED): 6.7 % — ABNORMAL HIGH (ref 4.8–5.6)

## 2023-09-04 MED ORDER — NALOXONE HCL 4 MG/0.1ML NA LIQD: 1.0000 | NASAL | 0 refills | Status: DC | PRN

## 2023-09-04 MED ORDER — SUMATRIPTAN SUCCINATE 50 MG PO TABS: 50.0000 mg | ORAL_TABLET | ORAL | 12 refills | Status: DC | PRN

## 2023-09-04 MED ORDER — RYBELSUS 14 MG PO TABS: 14.0000 mg | ORAL_TABLET | Freq: Every day | ORAL | 1 refills | Status: DC

## 2023-09-04 MED ORDER — METOPROLOL TARTRATE 50 MG PO TABS: ORAL_TABLET | ORAL | 1 refills | Status: DC

## 2023-09-04 MED ORDER — BREZTRI AEROSPHERE 160-9-4.8 MCG/ACT IN AERO: 2.0000 | INHALATION_SPRAY | Freq: Two times a day (BID) | RESPIRATORY_TRACT | 6 refills | Status: DC

## 2023-09-04 MED ORDER — NORTRIPTYLINE HCL 25 MG PO CAPS: ORAL_CAPSULE | ORAL | 1 refills | Status: DC

## 2023-09-04 MED ORDER — POTASSIUM CHLORIDE ER 20 MEQ PO TBCR: EXTENDED_RELEASE_TABLET | ORAL | 1 refills | Status: DC

## 2023-09-04 MED ORDER — AMLODIPINE BESYLATE 5 MG PO TABS: ORAL_TABLET | ORAL | 1 refills | Status: DC

## 2023-09-04 MED ORDER — EMPAGLIFLOZIN 25 MG PO TABS: ORAL_TABLET | ORAL | 1 refills | Status: DC

## 2023-09-04 MED ORDER — ATORVASTATIN CALCIUM 10 MG PO TABS: 10.0000 mg | ORAL_TABLET | Freq: Every day | ORAL | 1 refills | Status: DC

## 2023-09-04 MED ORDER — HYDRALAZINE HCL 100 MG PO TABS: 100.0000 mg | ORAL_TABLET | Freq: Two times a day (BID) | ORAL | 1 refills | Status: DC

## 2023-09-04 MED ORDER — METFORMIN HCL 500 MG PO TABS: ORAL_TABLET | ORAL | 1 refills | Status: DC

## 2023-09-04 MED ORDER — ALBUTEROL SULFATE (2.5 MG/3ML) 0.083% IN NEBU: INHALATION_SOLUTION | RESPIRATORY_TRACT | 1 refills | Status: DC

## 2023-09-04 MED ORDER — STIOLTO RESPIMAT 2.5-2.5 MCG/ACT IN AERS: 2.0000 | INHALATION_SPRAY | Freq: Four times a day (QID) | RESPIRATORY_TRACT | 12 refills | Status: AC | PRN

## 2023-09-04 MED ORDER — MONTELUKAST SODIUM 10 MG PO TABS: ORAL_TABLET | ORAL | 1 refills | Status: DC

## 2023-09-04 MED ORDER — LIDOCAINE 5 % EX PTCH: MEDICATED_PATCH | CUTANEOUS | 2 refills | Status: DC

## 2023-09-04 MED ORDER — METOPROLOL SUCCINATE ER 100 MG PO TB24: 100.0000 mg | ORAL_TABLET | Freq: Two times a day (BID) | ORAL | 1 refills | Status: DC

## 2023-09-04 NOTE — Assessment & Plan Note (Signed)
 Continue to follow with pain management. Doing well. Call with any concerns.

## 2023-09-04 NOTE — Assessment & Plan Note (Signed)
 Doing well with A1c of 6.7. Up slightly from 6.3- eating a lot of fruit. Will recheck in 3 months. Call with any concerns.

## 2023-09-04 NOTE — Assessment & Plan Note (Signed)
 Under good control on current regimen. Continue current regimen. Continue to monitor. Call with any concerns. Refills given. Labs drawn today.

## 2023-09-04 NOTE — Assessment & Plan Note (Signed)
 Will keep BP and cholesterol under good control. Continue to monitor. Call with any concerns.

## 2023-09-04 NOTE — Progress Notes (Signed)
 BP 130/78   Pulse 86   Temp 97.9 F (36.6 C) (Oral)   Wt 221 lb 12.8 oz (100.6 kg)   SpO2 96%   BMI 31.82 kg/m    Subjective:    Patient ID: Kristopher [name], male    DOB: 02-Jul-1960, 63 y.o.   MRN: [id number]  HPI: Kristopher [name] is a 63 y.o. male  Chief Complaint  Patient presents with   Diabetes   DIABETES Hypoglycemic episodes:no Polydipsia/polyuria: no Visual disturbance: no Chest pain: no Paresthesias: no Glucose Monitoring: yes Taking Insulin ?: no Blood Pressure Monitoring: not checking Retinal Examination: Up to Date Foot Exam: Up to Date Diabetic Education: Completed Pneumovax: Up to Date Influenza: Up to Date Aspirin: no  HYPERTENSION / HYPERLIPIDEMIA Satisfied with current treatment? yes Duration of hypertension: chronic BP monitoring frequency: not checking BP medication side effects: no Past BP meds: hydralazine , metoprolol , amlodipine  Duration of hyperlipidemia: chronic Cholesterol medication side effects: no Cholesterol supplements: none Past cholesterol medications: atorvastatin  Medication compliance: excellent compliance Aspirin: no Recent stressors: no Recurrent headaches: no Visual changes: no Palpitations: no Dyspnea: no Chest pain: no Lower extremity edema: no Dizzy/lightheaded: no   Relevant past medical, surgical, family and social history reviewed and updated as indicated. Interim medical history since our last visit reviewed. Allergies and medications reviewed and updated.  Review of Systems  Constitutional: Negative.   Respiratory: Negative.    Cardiovascular: Negative.   Gastrointestinal: Negative.   Musculoskeletal: Negative.   Neurological: Negative.   Psychiatric/Behavioral: Negative.      Per HPI unless specifically indicated above     Objective:    BP 130/78   Pulse 86   Temp 97.9 F (36.6 C) (Oral)   Wt 221 lb 12.8 oz (100.6 kg)   SpO2 96%   BMI 31.82 kg/m   Wt Readings from Last 3 Encounters:   09/04/23 221 lb 12.8 oz (100.6 kg)  09/02/23 205 lb (93 kg)  07/30/23 210 lb (95.3 kg)    Physical Exam Vitals and nursing note reviewed.  Constitutional:      General: He is not in acute distress.    Appearance: Normal appearance. He is obese. He is not ill-appearing, toxic-appearing or diaphoretic.  HENT:     Head: Normocephalic and atraumatic.     Right Ear: External ear normal.     Left Ear: External ear normal.     Nose: Nose normal.     Mouth/Throat:     Mouth: Mucous membranes are moist.     Pharynx: Oropharynx is clear.  Eyes:     General: No scleral icterus.       Right eye: No discharge.        Left eye: No discharge.     Extraocular Movements: Extraocular movements intact.     Conjunctiva/sclera: Conjunctivae normal.     Pupils: Pupils are equal, round, and reactive to light.  Cardiovascular:     Rate and Rhythm: Normal rate and regular rhythm.     Pulses: Normal pulses.     Heart sounds: Normal heart sounds. No murmur heard.    No friction rub. No gallop.  Pulmonary:     Effort: Pulmonary effort is normal. No respiratory distress.     Breath sounds: No stridor. Wheezing present. No rhonchi or rales.  Chest:     Chest wall: No tenderness.  Musculoskeletal:        General: Normal range of motion.     Cervical back: Normal range of motion  and neck supple.  Skin:    General: Skin is warm and dry.     Capillary Refill: Capillary refill takes less than 2 seconds.     Coloration: Skin is not jaundiced or pale.     Findings: No bruising, erythema, lesion or rash.  Neurological:     General: No focal deficit present.     Mental Status: He is alert and oriented to person, place, and time. Mental status is at baseline.  Psychiatric:        Mood and Affect: Mood normal.        Behavior: Behavior normal.        Thought Content: Thought content normal.        Judgment: Judgment normal.     Results for orders placed or performed in visit on 05/07/23  ToxASSURE  Select 13 (MW), Urine   Collection Time: 05/07/23  1:55 PM  Result Value Ref Range   Summary FINAL       Assessment & Plan:   Problem List Items Addressed This Visit       Cardiovascular and Mediastinum   Aortic atherosclerosis (HCC)   Will keep BP and cholesterol under good control. Continue to monitor. Call with any concerns.       Relevant Medications   metoprolol  tartrate (LOPRESSOR ) 50 MG tablet   metoprolol  succinate (TOPROL -XL) 100 MG 24 hr tablet   amLODipine  (NORVASC ) 5 MG tablet   atorvastatin  (LIPITOR) 10 MG tablet   hydrALAZINE  (APRESOLINE ) 100 MG tablet   Other Relevant Orders   CBC with Differential/Platelet   Comprehensive metabolic panel with GFR     Respiratory   Pulmonary emphysema (HCC) (Chronic)   Under good control on current regimen. Continue current regimen. Continue to monitor. Call with any concerns. Refills given. Labs drawn today.        Relevant Medications   STIOLTO RESPIMAT  2.5-2.5 MCG/ACT AERS   montelukast  (SINGULAIR ) 10 MG tablet   albuterol  (PROVENTIL ) (2.5 MG/3ML) 0.083% nebulizer solution   BREZTRI  AEROSPHERE 160-9-4.8 MCG/ACT AERO inhaler   Other Relevant Orders   CBC with Differential/Platelet   Comprehensive metabolic panel with GFR     Endocrine   Type 2 diabetes mellitus with stage 1 chronic kidney disease, without long-term current use of insulin  (HCC) - Primary   Doing well with A1c of 6.7. Up slightly from 6.3- eating a lot of fruit. Will recheck in 3 months. Call with any concerns.       Relevant Medications   Semaglutide  (RYBELSUS ) 14 MG TABS   metFORMIN  (GLUCOPHAGE ) 500 MG tablet   atorvastatin  (LIPITOR) 10 MG tablet   empagliflozin  (JARDIANCE ) 25 MG TABS tablet   Other Relevant Orders   Bayer DCA Hb A1c Waived   CBC with Differential/Platelet   Comprehensive metabolic panel with GFR   Hyperlipidemia associated with type 2 diabetes mellitus (HCC)   Under good control on current regimen. Continue current regimen.  Continue to monitor. Call with any concerns. Refills given. Labs drawn today.       Relevant Medications   Semaglutide  (RYBELSUS ) 14 MG TABS   metoprolol  tartrate (LOPRESSOR ) 50 MG tablet   metoprolol  succinate (TOPROL -XL) 100 MG 24 hr tablet   metFORMIN  (GLUCOPHAGE ) 500 MG tablet   amLODipine  (NORVASC ) 5 MG tablet   atorvastatin  (LIPITOR) 10 MG tablet   empagliflozin  (JARDIANCE ) 25 MG TABS tablet   hydrALAZINE  (APRESOLINE ) 100 MG tablet   Other Relevant Orders   CBC with Differential/Platelet   Comprehensive metabolic panel with  GFR   Lipid Panel w/o Chol/HDL Ratio     Genitourinary   Benign hypertensive renal disease   Under good control on current regimen. Continue current regimen. Continue to monitor. Call with any concerns. Refills given. Labs drawn today.        Relevant Orders   CBC with Differential/Platelet   Comprehensive metabolic panel with GFR     Other   Uncomplicated opioid dependence (HCC) (Chronic)   Continue to follow with pain management. Doing well. Call with any concerns.       Chronic pain syndrome (Chronic)   Continue to follow with pain management. Doing well. Call with any concerns.       Relevant Medications   naloxone  (NARCAN ) nasal spray 4 mg/0.1 mL   nortriptyline  (PAMELOR ) 25 MG capsule   Other Relevant Orders   CBC with Differential/Platelet   Comprehensive metabolic panel with GFR     Follow up plan: Return in about 3 months (around 12/05/2023).

## 2023-09-05 LAB — LIPID PANEL W/O CHOL/HDL RATIO
Cholesterol, Total: 155 mg/dL (ref 100–199)
HDL: 39 mg/dL — ABNORMAL LOW (ref >39)
LDL Chol Calc (NIH): 81 mg/dL (ref 0–99)
Triglycerides: 207 mg/dL — ABNORMAL HIGH (ref 0–149)
VLDL Cholesterol Cal: 35 mg/dL (ref 5–40)

## 2023-09-05 LAB — COMPREHENSIVE METABOLIC PANEL WITH GFR
ALT: 24 IU/L (ref 0–44)
AST: 27 IU/L (ref 0–40)
Albumin: 4.5 g/dL (ref 3.9–4.9)
Alkaline Phosphatase: 144 IU/L — ABNORMAL HIGH (ref 44–121)
BUN/Creatinine Ratio: 15 (ref 10–24)
BUN: 14 mg/dL (ref 8–27)
Bilirubin Total: 0.3 mg/dL (ref 0.0–1.2)
CO2: 21 mmol/L (ref 20–29)
Calcium: 9.6 mg/dL (ref 8.6–10.2)
Chloride: 101 mmol/L (ref 96–106)
Creatinine, Ser: 0.93 mg/dL (ref 0.76–1.27)
Globulin, Total: 2.7 g/dL (ref 1.5–4.5)
Glucose: 164 mg/dL — ABNORMAL HIGH (ref 70–99)
Potassium: 4.1 mmol/L (ref 3.5–5.2)
Sodium: 142 mmol/L (ref 134–144)
Total Protein: 7.2 g/dL (ref 6.0–8.5)
eGFR: 93 mL/min/1.73 (ref >59)

## 2023-09-05 LAB — CBC WITH DIFFERENTIAL/PLATELET
Basophils Absolute: 0.1 x10E3/uL (ref 0.0–0.2)
Basos: 1 %
EOS (ABSOLUTE): 0.1 x10E3/uL (ref 0.0–0.4)
Eos: 1 %
Hematocrit: 45.8 % (ref 37.5–51.0)
Hemoglobin: 14.9 g/dL (ref 13.0–17.7)
Immature Grans (Abs): 0.1 x10E3/uL (ref 0.0–0.1)
Immature Granulocytes: 1 %
Lymphocytes Absolute: 2 x10E3/uL (ref 0.7–3.1)
Lymphs: 27 %
MCH: 29.9 pg (ref 26.6–33.0)
MCHC: 32.5 g/dL (ref 31.5–35.7)
MCV: 92 fL (ref 79–97)
Monocytes Absolute: 0.5 x10E3/uL (ref 0.1–0.9)
Monocytes: 7 %
Neutrophils Absolute: 4.5 x10E3/uL (ref 1.4–7.0)
Neutrophils: 63 %
Platelets: 210 x10E3/uL (ref 150–450)
RBC: 4.98 x10E6/uL (ref 4.14–5.80)
RDW: 13.4 % (ref 11.6–15.4)
WBC: 7.2 x10E3/uL (ref 3.4–10.8)

## 2023-09-07 ENCOUNTER — Ambulatory Visit: Payer: Self-pay | Admitting: Family Medicine

## 2023-09-22 ENCOUNTER — Ambulatory Visit: Attending: Nurse Practitioner | Admitting: Nurse Practitioner

## 2023-09-22 ENCOUNTER — Encounter: Payer: Self-pay | Admitting: Nurse Practitioner

## 2023-09-22 VITALS — BP 125/86 | HR 64 | Temp 97.5°F | Resp 20 | Ht 70.0 in | Wt 210.0 lb

## 2023-09-22 DIAGNOSIS — M961 Postlaminectomy syndrome, not elsewhere classified: Secondary | ICD-10-CM | POA: Diagnosis not present

## 2023-09-22 DIAGNOSIS — M79605 Pain in left leg: Secondary | ICD-10-CM | POA: Diagnosis not present

## 2023-09-22 DIAGNOSIS — G894 Chronic pain syndrome: Secondary | ICD-10-CM | POA: Diagnosis not present

## 2023-09-22 DIAGNOSIS — M545 Low back pain, unspecified: Secondary | ICD-10-CM | POA: Diagnosis not present

## 2023-09-22 DIAGNOSIS — M5441 Lumbago with sciatica, right side: Secondary | ICD-10-CM | POA: Diagnosis not present

## 2023-09-22 DIAGNOSIS — M5442 Lumbago with sciatica, left side: Secondary | ICD-10-CM | POA: Diagnosis not present

## 2023-09-22 DIAGNOSIS — G8929 Other chronic pain: Secondary | ICD-10-CM | POA: Diagnosis not present

## 2023-09-22 DIAGNOSIS — Z79899 Other long term (current) drug therapy: Secondary | ICD-10-CM | POA: Insufficient documentation

## 2023-09-22 DIAGNOSIS — C3492 Malignant neoplasm of unspecified part of left bronchus or lung: Secondary | ICD-10-CM | POA: Insufficient documentation

## 2023-09-22 MED ORDER — OXYCODONE HCL 10 MG PO TABS: 10.0000 mg | ORAL_TABLET | Freq: Four times a day (QID) | ORAL | 0 refills | Status: DC | PRN

## 2023-09-22 NOTE — Progress Notes (Signed)
 Nursing Pain Medication Assessment:  Safety precautions to be maintained throughout the outpatient stay will include: orient to surroundings, keep bed in low position, maintain call bell within reach at all times, provide assistance with transfer out of bed and ambulation.  Medication Inspection Compliance: Kristopher [name] did not comply with our request to bring his pills to be counted. He was reminded that bringing the medication bottles, even when empty, is a requirement.  Medication: None brought in. Pill/Patch Count: None available to be counted. Bottle Appearance: No container available. Did not bring bottle(s) to appointment. Filled Date: N/A Last Medication intake:  Ran out of medicine more than 48 hours ago

## 2023-09-22 NOTE — Progress Notes (Deleted)
 Nursing Pain Medication Assessment:  Safety precautions to be maintained throughout the outpatient stay will include: orient to surroundings, keep bed in low position, maintain call bell within reach at all times, provide assistance with transfer out of bed and ambulation.  Medication Inspection Compliance: Kristopher [name] did not comply with our request to bring his pills to be counted. He was reminded that bringing the medication bottles, even when empty, is a requirement.  Medication: None brought in. Pill/Patch Count: None available to be counted. Bottle Appearance: No container available. Did not bring bottle(s) to appointment. Filled Date: N/A Last Medication intake:  {Blank single:19197::Ran out of medicine more than 48 hours ago,Day before yesterday,Yesterday,Today}

## 2023-09-22 NOTE — Progress Notes (Signed)
 PROVIDER NOTE: Interpretation of information contained herein should be left to medically-trained personnel. Specific patient instructions are provided elsewhere under Patient Instructions section of medical record. This document was created in part using AI and STT-dictation technology, any transcriptional errors that may result from this process are unintentional.  Patient: Kristopher [name]  Service: E/M   PCP: Vicci Duwaine SQUIBB, DO  DOB: 1960/03/21  DOS: 09/22/2023  Provider: Emmy MARLA Blanch, NP  MRN: [id number]  Delivery: Face-to-face  Specialty: Interventional Pain Management  Type: Established Patient  Setting: Ambulatory outpatient facility  Specialty designation: 09  Referring Prov.: Vicci Duwaine SQUIBB, DO  Location: Outpatient office facility       History of present illness (HPI) Kristopher [name], a 63 y.o. year old male, is here today because of his Chronic bilateral low back pain with bilateral sciatica [M54.42, M54.41, G89.29]. Kristopher [name] primary complain today is Back Pain (Radiates down to left hip and Left leg and knee )  Pertinent problems: Kristopher [name] has Chronic low back pain (1ry area of pain) (Bilateral) (L>R); Lumbar spondylosis; Chronic lumbar radicular pain (S1 dermatomal) (Left); 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); Chronic neck pain (posterior midline) (Bilateral) (L>R); Cervical spondylosis; and Chronic pain syndrome on their pertinent problem list  Pain Assessment: Severity of Chronic pain is reported as a 3 /10. Location: Back Lower, Left/Radiates down to left hip and left leg. Onset: More than a month ago. Quality: Aching, Shooting, Throbbing. Timing: Constant. Modifying factor(s): Medication. Vitals:  height is 5' 10 (1.778 m) and weight is 210 lb (95.3 kg). His temperature is 97.5 F (36.4 C) (abnormal). His blood pressure is 125/86 and his pulse is 64. His respiration is 20 and oxygen saturation is 98%.  BMI: Estimated body mass index is  30.13 kg/m as calculated from the following:   Height as of this encounter: 5' 10 (1.778 m).   Weight as of this encounter: 210 lb (95.3 kg).  Last encounter: 07/30/2023. Last procedure: Visit date not found.  Reason for encounter: medication management. The patient indicates doing well with current medication regimen. No adverse reaction or side effects reported to medication.  The patient is currently unable to obtain morphine  due to a Scientist, clinical (histocompatibility and immunogenetics).  We discussed alternative options and decided to initiate Oxycodone  HCl 10 mg every 6 hours as needed for pain equivalent to morphine  sulfate 15 mg morphine  milligram equivalent.  This will be a temporary regimen for up to 30 days.   Pharmacotherapy Assessment   Analgesic: Oxycodone  HCl 10 mg every 6 hours as needed for pain for up to 30 days Monitoring: [place] PMP: PDMP reviewed during this encounter.       Pharmacotherapy: No side-effects or adverse reactions reported. Compliance: No problems identified. Effectiveness: Clinically acceptable.  Erlene Doyal SAUN[place], [place]  09/22/2023 11:36 AM  Sign when Signing Visit Nursing Pain Medication Assessment:  Safety precautions to be maintained throughout the outpatient stay will include: orient to surroundings, keep bed in low position, maintain call bell within reach at all times, provide assistance with transfer out of bed and ambulation.  Medication Inspection Compliance: Kristopher [name] did not comply with our request to bring his pills to be counted. He was reminded that bringing the medication bottles, even when empty, is a requirement.  Medication: None brought in. Pill/Patch Count: None available to be counted. Bottle Appearance: No container available. Did not bring bottle(s) to appointment. Filled Date: N/A Last Medication intake:  Ran out of medicine more  than 48 hours ago    UDS:  Summary  Date Value Ref Range Status  05/07/2023 FINAL  Final    Comment:     ==================================================================== [employer] Select 13 (MW) ==================================================================== Test                             Result       Flag       Units  Drug Present and Declared for Prescription Verification   Morphine                        >13889       EXPECTED   ng/mg creat   Normorphine                    113          EXPECTED   ng/mg creat    Potential sources of large amounts of morphine  in the absence of    codeine include administration of morphine  or use of heroin.     Normorphine is an expected metabolite of morphine .  ==================================================================== Test                      Result    Flag   Units      Ref Range   Creatinine              72               mg/dL      >=79 ==================================================================== Declared Medications:  The flagging and interpretation on this report are based on the  following declared medications.  Unexpected results may arise from  inaccuracies in the declared medications.   **Note: The testing scope of this panel includes these medications:   Morphine    **Note: The testing scope of this panel does not include the  following reported medications:   Albuterol  (Ventolin  HFA)  Amlodipine  (Norvasc )  Budesonide (Breztri  Aerosphere)  Diphenhydramine  (Benadryl )  Empagliflozin  (Jardiance )  Esomeprazole  (Nexium )  Formoterol (Breztri  Aerosphere)  Glycopyrrolate (Breztri  Aerosphere)  Hydralazine  (Apresoline )  Metformin   Metoprolol  (Toprol )  Montelukast  (Singulair )  Multivitamin  Naloxone  (Narcan )  Nortriptyline  (Pamelor )  Olodaterol (Stiolto Respimat )  Potassium Chloride   Semaglutide  (Rybelsus )  Sumatriptan  (Imitrex )  Tiotropium (Stiolto Respimat )  Topical Lidocaine  ==================================================================== For clinical consultation, please call [phone]. ====================================================================     No results found for: CBDTHCR No results found for: D8THCCBX No results found for: D9THCCBX  ROS  Constitutional: Denies any fever or chills Gastrointestinal: No reported hemesis, hematochezia, vomiting, or acute GI distress Musculoskeletal: Back pain (Radiates down to left hip and Left leg and knee ) Neurological: No reported episodes of acute onset apraxia, aphasia, dysarthria, agnosia, amnesia, paralysis, loss of coordination, or loss of consciousness  Medication Review  Accu-Chek FastClix Lancets, Oxycodone  HCl, Potassium Chloride  ER, SUMAtriptan , Semaglutide , Tiotropium Bromide-Olodaterol, albuterol , amLODipine , atorvastatin , budesonide-glycopyrrolate-formoterol, diphenhydrAMINE , empagliflozin , esomeprazole , glucose blood, hydrALAZINE , lidocaine , metFORMIN , metoprolol  succinate, metoprolol  tartrate, montelukast , multivitamin with minerals, naloxone , and nortriptyline   History Review  Allergy: Kristopher [name] is allergic to acetaminophen , aspirin, epinephrine, novocain [procaine], penicillins, strawberry extract, and shellfish allergy. Drug: Kristopher [name]  reports current drug use. Drug: Morphine . Alcohol:  reports no history of alcohol use. Tobacco:  reports that he quit smoking about 4 years ago. His smoking use included cigarettes. He started smoking about 39 years ago. He has a 8.8 pack-year smoking history. He has never used  smokeless tobacco. Social: Kristopher [name]  reports that he quit smoking about 4 years ago. His smoking use included cigarettes. He started smoking about 39 years ago. He has a 8.8 pack-year smoking history. He has never used smokeless tobacco. He reports current drug use. Drug: Morphine . He reports that he does not drink alcohol. Medical:  has a past medical history of Allergy, Arthritis, Benign hypertensive kidney disease, Chronic back pain, Diabetes mellitus, type 2 (HCC), Dyspnea,  GERD (gastroesophageal reflux disease), Hypertension, Malignant neoplasm of lung (HCC), and Migraines. Surgical: Kristopher [name]  has a past surgical history that includes Back surgery; Appendectomy; Leg Surgery; Knee surgery (Left); Foot surgery (Left); Colonoscopy with propofol  (N/A, 02/19/2016); polypectomy (N/A, 02/19/2016); Total hip arthroplasty (Left, 12/02/2017); Colonoscopy with propofol  (N/A, 01/19/2018); polypectomy (01/19/2018); DG OPERATIVE LEFT HIP ([facility] HX); Joint replacement (Left, 12/2017); Video bronchoscopy (Left, 01/14/2019); Thoracotomy (Left, 01/14/2019); Flexible bronchoscopy (Bilateral, 01/20/2019); Flexible bronchoscopy (Bilateral, 01/22/2019); Electormagnetic navigation bronchoscopy (Left, 11/18/2018); Lung cancer surgery; Video bronchoscopy with endobronchial ultrasound (N/A, 10/15/2019); Video bronchoscopy with endobronchial navigation (N/A, 10/15/2019); Portacath placement (N/A, 11/11/2019); Colonoscopy with propofol  (N/A, 03/16/2021); and Colonoscopy with propofol  (N/A, 04/06/2021). Family: family history includes Cancer in his father; Diabetes in his sister; Heart disease in his mother; Thrombosis in his sister.  Laboratory Chemistry Profile   Renal Lab Results  Component Value Date   BUN 14 09/04/2023   CREATININE 0.93 09/04/2023   BCR 15 09/04/2023   GFRAA >60 12/01/2019   GFRNONAA >60 07/05/2021    Hepatic Lab Results  Component Value Date   AST 27 09/04/2023   ALT 24 09/04/2023   ALBUMIN 4.5 09/04/2023   ALKPHOS 144 (H) 09/04/2023   LIPASE 50 02/17/2019    Electrolytes Lab Results  Component Value Date   NA 142 09/04/2023   K 4.1 09/04/2023   CL 101 09/04/2023   CALCIUM  9.6 09/04/2023   MG 2.4 01/25/2019   PHOS 3.5 01/23/2019    Bone No results found for: VD25OH, VD125OH2TOT, CI6874NY7, CI7874NY7, 25OHVITD1, 25OHVITD2, 25OHVITD3, TESTOFREE, TESTOSTERONE  Inflammation (CRP: Acute Phase) (ESR: Chronic Phase) Lab Results  Component Value Date    CRP 0.7 04/03/2015   ESRSEDRATE 11 10/21/2017   LATICACIDVEN 1.7 01/16/2019         Note: Above Lab results reviewed.  Recent Imaging Review  CT Chest W Contrast CLINICAL DATA:  Follow-up lung cancer. LEFT upper lobe lobectomy November 2020. Post chemo radiation therapy 2021. * Tracking Code: BO *  EXAM: CT CHEST WITH CONTRAST  TECHNIQUE: Multidetector CT imaging of the chest was performed during intravenous contrast administration.  RADIATION DOSE REDUCTION: This exam was performed according to the departmental dose-optimization program which includes automated exposure control, adjustment of the mA and/or kV according to patient size and/or use of iterative reconstruction technique.  CONTRAST:  75mL OMNIPAQUE  IOHEXOL  300 MG/ML  SOLN  COMPARISON:  Chest CT 05/08/2022  FINDINGS: Cardiovascular: No significant vascular findings. Normal heart size. No pericardial effusion.  Mediastinum/Nodes: No axillary or supraclavicular adenopathy. No mediastinal or hilar adenopathy. No pericardial fluid. Esophagus normal.  Lungs/Pleura: LEFT upper lobectomy anatomy. Mild consolidation about the LEFT hilum with air bronchograms typical of radiation change. No LEFT lung nodularity.  Subpleural nodule in the RIGHT upper lobe posteriorly measures 5 mm (image 81/3) not changed from prior.  Upper Abdomen: Limited view of the liver, kidneys, pancreas are unremarkable. Normal adrenal glands.  Musculoskeletal: No aggressive osseous lesion.  IMPRESSION: 1. LEFT upper lobectomy anatomy. No evidence of local tumor recurrence. 2. Stable radiation  change about the LEFT hilum. 3. Stable RIGHT upper lobe pulmonary nodule. 4. No evidence of metastatic adenopathy.  Electronically Signed   By: Jackquline Boxer M.D.   On: 11/27/2022 10:39 Note: Reviewed        Physical Exam  Vitals: BP 125/86   Pulse 64   Temp (!) 97.5 F (36.4 C)   Resp 20   Ht 5' 10 (1.778 m)   Wt 210 lb (95.3  kg)   SpO2 98%   BMI 30.13 kg/m  BMI: Estimated body mass index is 30.13 kg/m as calculated from the following:   Height as of this encounter: 5' 10 (1.778 m).   Weight as of this encounter: 210 lb (95.3 kg). Ideal: Ideal body weight: 73 kg (160 lb 15 oz) Adjusted ideal body weight: 81.9 kg (180 lb 9 oz) General appearance: Well nourished, well developed, and well hydrated. In no apparent acute distress Mental status: Alert, oriented x 3 (person, place, & time)       Respiratory: No evidence of acute respiratory distress Eyes: PERLA   Assessment   Diagnosis Status  1. Chronic low back pain (1ry area of Pain) (Bilateral) (L>R)   2. Chronic lower extremity pain (2ry area of Pain) (Left)   3. Chronic pain syndrome   4. Pharmacologic therapy   5. Squamous cell carcinoma of left lung (HCC)   6. 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)   7. Medication management    Controlled Controlled Controlled   Updated Problems: No problems updated.  Plan of Care  Problem-specific:  Assessment and Plan  Started on Oxycodone  HCl 10 mg every 6 hours as needed for severe pain for up to 30 days.  Prescribing drug monitoring (PDMP) reviewed; findings consistent with use of prescribed medication and no evidence of narcotic misuse or abuse.   Urine drug screening (UDS) up to date.  Schedule follow-up in 30 days for evaluation of new medication.  No other new issues or problems reported to this visit.   Kristopher [name] has a current medication list which includes the following long-term medication(s): albuterol , albuterol , amlodipine , atorvastatin , esomeprazole , hydralazine , metformin , metoprolol  succinate, metoprolol  tartrate, montelukast , nortriptyline , potassium chloride  er, and sumatriptan .  Pharmacotherapy (Medications Ordered): Meds ordered this encounter  Medications   Oxycodone  HCl 10 MG TABS    Sig: Take 1 tablet (10 mg total) by mouth every 6 (six) hours  as needed. Must last 30 days.    Dispense:  120 tablet    Refill:  0    Chronic Pain: STOP Act (Not applicable) Fill 1 day early if closed on refill date. Avoid benzodiazepines within 8 hours of opioids   Orders:  No orders of the defined types were placed in this encounter.       Return in about 1 month (around 10/23/2023) for (F2F), (MM), Emmy Blanch NP.    Recent Visits Date Type Provider Dept  07/30/23 Office Visit Nini Cavan K, NP [facility]  Showing recent visits within past 90 days and meeting all other requirements Today's Visits Date Type Provider Dept  09/22/23 Office Visit Flavius Repsher K, NP [facility]  Showing today's visits and meeting all other requirements Future Appointments Date Type Provider Dept  10/23/23 Appointment Leslieanne Cobarrubias K, NP [facility]  Showing future appointments within next 90 days and meeting all other requirements  I discussed the assessment and treatment plan with the patient. The patient was provided an opportunity to ask  questions and all were answered. The patient agreed with the plan and demonstrated an understanding of the instructions.  Patient advised to call back or seek an in-person evaluation if the symptoms or condition worsens.  Duration of encounter: 30 minutes.  Total time on encounter, as per AMA guidelines included both the face-to-face and non-face-to-face time personally spent by the physician and/or other qualified health care professional(s) on the day of the encounter (includes time in activities that require the physician or other qualified health care professional and does not include time in activities normally performed by clinical staff). Physician's time may include the following activities when performed: Preparing to see the patient (e.g., pre-charting review of records, searching for previously ordered imaging, lab work, and nerve conduction tests) Review of prior analgesic  pharmacotherapies. Reviewing PMP Interpreting ordered tests (e.g., lab work, imaging, nerve conduction tests) Performing post-procedure evaluations, including interpretation of diagnostic procedures Obtaining and/or reviewing separately obtained history Performing a medically appropriate examination and/or evaluation Counseling and educating the patient/family/caregiver Ordering medications, tests, or procedures Referring and communicating with other health care professionals (when not separately reported) Documenting clinical information in the electronic or other health record Independently interpreting results (not separately reported) and communicating results to the patient/ family/caregiver Care coordination (not separately reported)  Note by: Elissia Spiewak K Demitrus Francisco, NP (TTS and AI technology used. I apologize for any typographical errors that were not detected and corrected.) Date: 09/22/2023; Time: 11:49 AM

## 2023-10-22 NOTE — Progress Notes (Unsigned)
 PROVIDER NOTE: Interpretation of information contained herein should be left to medically-trained personnel. Specific patient instructions are provided elsewhere under Patient Instructions section of medical record. This document was created in part using AI and STT-dictation technology, any transcriptional errors that may result from this process are unintentional.  Patient: Kristopher [name]  Service: E/M   PCP: Vicci Duwaine SQUIBB, DO  DOB: [birth year]  DOS: 10/23/2023  Provider: Emmy MARLA Blanch, NP  MRN: [id number]  Delivery: Face-to-face  Specialty: Interventional Pain Management  Type: Established Patient  Setting: Ambulatory outpatient facility  Specialty designation: 09  Referring Prov.: Vicci Duwaine SQUIBB, DO  Location: Outpatient office facility       History of present illness (HPI) Kristopher [name], a 63 y.o. year old male, is here today because of his Chronic bilateral low back pain with bilateral sciatica [M54.42, M54.41, G89.29]. Mr. Mia primary complain today is No chief complaint on file.  Pertinent problems: Kristopher [name] does not have any pertinent problems on file.  Pain Assessment: Severity of   is reported as a  /10. Location:    / . Onset:  . Quality:  . Timing:  . Modifying factor(s):  SABRA Vitals:  vitals were not taken for this visit.  BMI: Estimated body mass index is 30.13 kg/m as calculated from the following:   Height as of 09/22/23: 5' 10 (1.778 m).   Weight as of 09/22/23: 210 lb (95.3 kg).  Last encounter: 09/22/2023. Last procedure: Visit date not found.  Reason for encounter:  *** .    Pharmacotherapy Assessment   Analgesic: Oxycodone  HCl 10 mg every 6 hours as needed for pain for up to 30 days Monitoring: [place] PMP: PDMP not reviewed this encounter.       Pharmacotherapy: No side-effects or adverse reactions reported. Compliance: No problems identified. Effectiveness: Clinically acceptable.  No notes on file  UDS:  Summary  Date Value Ref Range Status   05/07/2023 FINAL  Final    Comment:    ==================================================================== [employer] (MW) ==================================================================== Test                             Result       Flag       Units  Drug Present and Declared for Prescription Verification   Morphine                        [phone]       EXPECTED   ng/mg creat   Normorphine                    113          EXPECTED   ng/mg creat    Potential sources of large amounts of morphine  in the absence of    codeine include administration of morphine  or use of heroin.     Normorphine is an expected metabolite of morphine .  ==================================================================== Test                      Result    Flag   Units      Ref Range   Creatinine              72               mg/dL      >=79 ==================================================================== Declared Medications:  The flagging and interpretation on  this report are based on the  following declared medications.  Unexpected results may arise from  inaccuracies in the declared medications.   **Note: The testing scope of this panel includes these medications:   Morphine    **Note: The testing scope of this panel does not include the  following reported medications:   Albuterol  (Ventolin  HFA)  Amlodipine  (Norvasc )  Budesonide (Breztri  Aerosphere)  Diphenhydramine  (Benadryl )  Empagliflozin  (Jardiance )  Esomeprazole  (Nexium )  Formoterol (Breztri  Aerosphere)  Glycopyrrolate (Breztri  Aerosphere)  Hydralazine  (Apresoline )  Metformin   Metoprolol  (Toprol )  Montelukast  (Singulair )  Multivitamin  Naloxone  (Narcan )  Nortriptyline  (Pamelor )  Olodaterol (Stiolto Respimat )  Potassium Chloride   Semaglutide  (Rybelsus )  Sumatriptan  (Imitrex )  Tiotropium (Stiolto Respimat )  Topical Lidocaine  ==================================================================== For clinical  consultation, please call [phone]. ====================================================================     No results found for: CBDTHCR No results found for: D8THCCBX No results found for: D9THCCBX  ROS  Constitutional: Denies any fever or chills Gastrointestinal: No reported hemesis, hematochezia, vomiting, or acute GI distress Musculoskeletal: Denies any acute onset joint swelling, redness, loss of ROM, or weakness Neurological: No reported episodes of acute onset apraxia, aphasia, dysarthria, agnosia, amnesia, paralysis, loss of coordination, or loss of consciousness  Medication Review  Accu-Chek FastClix Lancets, Oxycodone  HCl, Potassium Chloride  ER, SUMAtriptan , Semaglutide , Tiotropium Bromide-Olodaterol, albuterol , amLODipine , atorvastatin , budesonide-glycopyrrolate-formoterol, diphenhydrAMINE , empagliflozin , esomeprazole , glucose blood, hydrALAZINE , lidocaine , metFORMIN , metoprolol  succinate, metoprolol  tartrate, montelukast , multivitamin with minerals, naloxone , and nortriptyline   History Review  Allergy: Kristopher [name] is allergic to acetaminophen , aspirin, epinephrine, novocain [procaine], penicillins, strawberry extract, and shellfish allergy. Drug: Kristopher [name]  reports current drug use. Drug: Morphine . Alcohol:  reports no history of alcohol use. Tobacco:  reports that he quit smoking about 4 years ago. His smoking use included cigarettes. He started smoking about 39 years ago. He has a 8.8 pack-year smoking history. He has never used smokeless tobacco. Social: Kristopher [name]  reports that he quit smoking about 4 years ago. His smoking use included cigarettes. He started smoking about 39 years ago. He has a 8.8 pack-year smoking history. He has never used smokeless tobacco. He reports current drug use. Drug: Morphine . He reports that he does not drink alcohol. Medical:  has a past medical history of Allergy, Arthritis, Benign hypertensive kidney disease, Chronic back pain,  Diabetes mellitus, type 2 (HCC), Dyspnea, GERD (gastroesophageal reflux disease), Hypertension, Malignant neoplasm of lung (HCC), and Migraines. Surgical: Kristopher [name]  has a past surgical history that includes Back surgery; Appendectomy; Leg Surgery; Knee surgery (Left); Foot surgery (Left); Colonoscopy with propofol  (N/A, 02/19/2016); polypectomy (N/A, 02/19/2016); Total hip arthroplasty (Left, 12/02/2017); Colonoscopy with propofol  (N/A, 01/19/2018); polypectomy (01/19/2018); DG OPERATIVE LEFT HIP ([facility] HX); Joint replacement (Left, 12/2017); Video bronchoscopy (Left, 01/14/2019); Thoracotomy (Left, 01/14/2019); Flexible bronchoscopy (Bilateral, 01/20/2019); Flexible bronchoscopy (Bilateral, 01/22/2019); Electormagnetic navigation bronchoscopy (Left, 11/18/2018); Lung cancer surgery; Video bronchoscopy with endobronchial ultrasound (N/A, 10/15/2019); Video bronchoscopy with endobronchial navigation (N/A, 10/15/2019); Portacath placement (N/A, 11/11/2019); Colonoscopy with propofol  (N/A, 03/16/2021); and Colonoscopy with propofol  (N/A, 04/06/2021). Family: family history includes Cancer in his father; Diabetes in his sister; Heart disease in his mother; Thrombosis in his sister.  Laboratory Chemistry Profile   Renal Lab Results  Component Value Date   BUN 14 09/04/2023   CREATININE 0.93 09/04/2023   BCR 15 09/04/2023   GFRAA >60 12/01/2019   GFRNONAA >60 07/05/2021    Hepatic Lab Results  Component Value Date   AST 27 09/04/2023   ALT 24 09/04/2023   ALBUMIN 4.5 09/04/2023   ALKPHOS 144 (H) 09/04/2023  LIPASE 50 02/17/2019    Electrolytes Lab Results  Component Value Date   NA 142 09/04/2023   K 4.1 09/04/2023   CL 101 09/04/2023   CALCIUM  9.6 09/04/2023   MG 2.4 01/25/2019   PHOS 3.5 01/23/2019    Bone No results found for: VD25OH, VD125OH2TOT, CI6874NY7, CI7874NY7, 25OHVITD1, 25OHVITD2, 25OHVITD3, TESTOFREE, TESTOSTERONE  Inflammation (CRP: Acute Phase) (ESR: Chronic  Phase) Lab Results  Component Value Date   CRP 0.7 04/03/2015   ESRSEDRATE 11 10/21/2017   LATICACIDVEN 1.7 01/16/2019         Note: Above Lab results reviewed.  Recent Imaging Review  CT Chest W Contrast CLINICAL DATA:  Follow-up lung cancer. LEFT upper lobe lobectomy November 2020. Post chemo radiation therapy 2021. * Tracking Code: BO *  EXAM: CT CHEST WITH CONTRAST  TECHNIQUE: Multidetector CT imaging of the chest was performed during intravenous contrast administration.  RADIATION DOSE REDUCTION: This exam was performed according to the departmental dose-optimization program which includes automated exposure control, adjustment of the mA and/or kV according to patient size and/or use of iterative reconstruction technique.  CONTRAST:  75mL OMNIPAQUE  IOHEXOL  300 MG/ML  SOLN  COMPARISON:  Chest CT 05/08/2022  FINDINGS: Cardiovascular: No significant vascular findings. Normal heart size. No pericardial effusion.  Mediastinum/Nodes: No axillary or supraclavicular adenopathy. No mediastinal or hilar adenopathy. No pericardial fluid. Esophagus normal.  Lungs/Pleura: LEFT upper lobectomy anatomy. Mild consolidation about the LEFT hilum with air bronchograms typical of radiation change. No LEFT lung nodularity.  Subpleural nodule in the RIGHT upper lobe posteriorly measures 5 mm (image 81/3) not changed from prior.  Upper Abdomen: Limited view of the liver, kidneys, pancreas are unremarkable. Normal adrenal glands.  Musculoskeletal: No aggressive osseous lesion.  IMPRESSION: 1. LEFT upper lobectomy anatomy. No evidence of local tumor recurrence. 2. Stable radiation change about the LEFT hilum. 3. Stable RIGHT upper lobe pulmonary nodule. 4. No evidence of metastatic adenopathy.  Electronically Signed   By: Jackquline Boxer M.D.   On: 11/27/2022 10:39 Note: Reviewed        Physical Exam  Vitals: There were no vitals taken for this visit. BMI: Estimated  body mass index is 30.13 kg/m as calculated from the following:   Height as of 09/22/23: 5' 10 (1.778 m).   Weight as of 09/22/23: 210 lb (95.3 kg). Ideal: Patient weight not recorded General appearance: Well nourished, well developed, and well hydrated. In no apparent acute distress Mental status: Alert, oriented x 3 (person, place, & time)       Respiratory: No evidence of acute respiratory distress Eyes: PERLA   Assessment   Diagnosis Status  1. Chronic low back pain (1ry area of Pain) (Bilateral) (L>R)   2. Chronic lower extremity pain (2ry area of Pain) (Left)   3. Chronic pain syndrome   4. Pharmacologic therapy   5. 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)   6. Medication management   7. Squamous cell carcinoma of left lung (HCC)    Controlled Controlled Controlled   Updated Problems: No problems updated.  Plan of Care  Problem-specific:  Assessment and Plan            Kristopher [name] has a current medication list which includes the following long-term medication(s): albuterol , albuterol , amlodipine , atorvastatin , esomeprazole , hydralazine , metformin , metoprolol  succinate, metoprolol  tartrate, montelukast , nortriptyline , potassium chloride  er, and sumatriptan .  Pharmacotherapy (Medications Ordered): No orders of the defined types were placed in this encounter.  Orders:  No orders  of the defined types were placed in this encounter.    {There is no content from the last Plan section.}   No follow-ups on file.    Recent Visits Date Type Provider Dept  09/22/23 Office Visit Monnie Gudgel K, NP [facility]  07/30/23 Office Visit Jancarlos Thrun K, NP [facility]  Showing recent visits within past 90 days and meeting all other requirements Future Appointments Date Type Provider Dept  10/23/23 Appointment Drayven Marchena K, NP [facility]  Showing future appointments within next 90 days and meeting all other  requirements  I discussed the assessment and treatment plan with the patient. The patient was provided an opportunity to ask questions and all were answered. The patient agreed with the plan and demonstrated an understanding of the instructions.  Patient advised to call back or seek an in-person evaluation if the symptoms or condition worsens.  Duration of encounter: *** minutes.  Total time on encounter, as per AMA guidelines included both the face-to-face and non-face-to-face time personally spent by the physician and/or other qualified health care professional(s) on the day of the encounter (includes time in activities that require the physician or other qualified health care professional and does not include time in activities normally performed by clinical staff). Physician's time may include the following activities when performed: Preparing to see the patient (e.g., pre-charting review of records, searching for previously ordered imaging, lab work, and nerve conduction tests) Review of prior analgesic pharmacotherapies. Reviewing PMP Interpreting ordered tests (e.g., lab work, imaging, nerve conduction tests) Performing post-procedure evaluations, including interpretation of diagnostic procedures Obtaining and/or reviewing separately obtained history Performing a medically appropriate examination and/or evaluation Counseling and educating the patient/family/caregiver Ordering medications, tests, or procedures Referring and communicating with other health care professionals (when not separately reported) Documenting clinical information in the electronic or other health record Independently interpreting results (not separately reported) and communicating results to the patient/ family/caregiver Care coordination (not separately reported)  Note by: Dartanyan Deasis K Peighton Edgin, NP (TTS and AI technology used. I apologize for any typographical errors that were not detected and corrected.) Date:  10/23/2023; Time: 3:17 PM

## 2023-10-23 ENCOUNTER — Ambulatory Visit: Attending: Nurse Practitioner | Admitting: Nurse Practitioner

## 2023-10-23 ENCOUNTER — Encounter: Payer: Self-pay | Admitting: Nurse Practitioner

## 2023-10-23 VITALS — BP 107/81 | HR 92 | Temp 97.1°F | Resp 16 | Ht 70.0 in | Wt 210.1 lb

## 2023-10-23 DIAGNOSIS — M79605 Pain in left leg: Secondary | ICD-10-CM | POA: Diagnosis not present

## 2023-10-23 DIAGNOSIS — M961 Postlaminectomy syndrome, not elsewhere classified: Secondary | ICD-10-CM | POA: Insufficient documentation

## 2023-10-23 DIAGNOSIS — M5441 Lumbago with sciatica, right side: Secondary | ICD-10-CM | POA: Diagnosis not present

## 2023-10-23 DIAGNOSIS — G894 Chronic pain syndrome: Secondary | ICD-10-CM | POA: Insufficient documentation

## 2023-10-23 DIAGNOSIS — M5442 Lumbago with sciatica, left side: Secondary | ICD-10-CM | POA: Insufficient documentation

## 2023-10-23 DIAGNOSIS — G8929 Other chronic pain: Secondary | ICD-10-CM | POA: Diagnosis not present

## 2023-10-23 DIAGNOSIS — C3492 Malignant neoplasm of unspecified part of left bronchus or lung: Secondary | ICD-10-CM | POA: Diagnosis not present

## 2023-10-23 DIAGNOSIS — Z79899 Other long term (current) drug therapy: Secondary | ICD-10-CM | POA: Insufficient documentation

## 2023-10-23 MED ORDER — OXYCODONE HCL 10 MG PO TABS: 10.0000 mg | ORAL_TABLET | Freq: Four times a day (QID) | ORAL | 0 refills | Status: DC | PRN

## 2023-10-23 NOTE — Progress Notes (Signed)
 Nursing Pain Medication Assessment:  Safety precautions to be maintained throughout the outpatient stay will include: orient to surroundings, keep bed in low position, maintain call bell within reach at all times, provide assistance with transfer out of bed and ambulation.  Medication Inspection Compliance: Pill count conducted under aseptic conditions, in front of the patient. Neither the pills nor the bottle was removed from the patient's sight at any time. Once count was completed pills were immediately returned to the patient in their original bottle.  Medication: Oxycodone  IR Pill/Patch Count: 02 of 120 pills/patches remain Pill/Patch Appearance: Markings consistent with prescribed medication Bottle Appearance: Standard pharmacy container. Clearly labeled. Filled Date: 07 / 21 / 2025 Last Medication intake:  Today

## 2023-10-30 ENCOUNTER — Encounter: Admitting: Nurse Practitioner

## 2023-11-07 ENCOUNTER — Telehealth: Payer: Self-pay

## 2023-11-07 NOTE — Telephone Encounter (Signed)
 Copied from CRM [id number]. Topic: General - Other >> Nov 07, 2023  8:29 AM Cleave MATSU wrote: Reason for CRM: can dr fill out form for pt to get out of jury duty he have metal in his back , left leg and hip

## 2023-11-10 ENCOUNTER — Encounter: Payer: Self-pay | Admitting: Family Medicine

## 2023-11-10 NOTE — Telephone Encounter (Signed)
 Note on his chart

## 2023-11-11 NOTE — Telephone Encounter (Signed)
 I printed and it has been picked up

## 2023-11-19 ENCOUNTER — Encounter: Payer: Self-pay | Admitting: Nurse Practitioner

## 2023-11-19 ENCOUNTER — Ambulatory Visit: Attending: Nurse Practitioner | Admitting: Nurse Practitioner

## 2023-11-19 VITALS — BP 116/88 | HR 64 | Temp 97.2°F | Resp 20 | Ht 69.0 in | Wt 210.0 lb

## 2023-11-19 DIAGNOSIS — M5442 Lumbago with sciatica, left side: Secondary | ICD-10-CM | POA: Diagnosis not present

## 2023-11-19 DIAGNOSIS — M961 Postlaminectomy syndrome, not elsewhere classified: Secondary | ICD-10-CM | POA: Insufficient documentation

## 2023-11-19 DIAGNOSIS — M79605 Pain in left leg: Secondary | ICD-10-CM | POA: Insufficient documentation

## 2023-11-19 DIAGNOSIS — G894 Chronic pain syndrome: Secondary | ICD-10-CM | POA: Diagnosis not present

## 2023-11-19 DIAGNOSIS — G8929 Other chronic pain: Secondary | ICD-10-CM | POA: Diagnosis not present

## 2023-11-19 DIAGNOSIS — M5441 Lumbago with sciatica, right side: Secondary | ICD-10-CM | POA: Insufficient documentation

## 2023-11-19 DIAGNOSIS — Z79899 Other long term (current) drug therapy: Secondary | ICD-10-CM | POA: Diagnosis not present

## 2023-11-19 MED ORDER — MORPHINE SULFATE 15 MG PO TABS: 15.0000 mg | ORAL_TABLET | Freq: Four times a day (QID) | ORAL | 0 refills | Status: AC | PRN

## 2023-11-19 MED ORDER — MORPHINE SULFATE 15 MG PO TABS: 15.0000 mg | ORAL_TABLET | Freq: Four times a day (QID) | ORAL | 0 refills | Status: DC | PRN

## 2023-11-19 NOTE — Progress Notes (Signed)
 PROVIDER NOTE: Interpretation of information contained herein should be left to medically-trained personnel. Specific patient instructions are provided elsewhere under Patient Instructions section of medical record. This document was created in part using AI and STT-dictation technology, any transcriptional errors that may result from this process are unintentional.  Patient: Kristopher [name]  Service: E/M   PCP: Vicci Duwaine SQUIBB, DO  DOB: 12-08-1960  DOS: 11/19/2023  Provider: Emmy MARLA Blanch, NP  MRN: [id number]  Delivery: Face-to-face  Specialty: Interventional Pain Management  Type: Established Patient  Setting: Ambulatory outpatient facility  Specialty designation: 09  Referring Prov.: Vicci Duwaine SQUIBB, DO  Location: Outpatient office facility       History of present illness (HPI) Kristopher [name], a 63 y.o. year old male, is here today because of his Chronic pain syndrome [G89.4]. Kristopher [name] primary complain today is Back Pain (Radiates down left side, down left leg to below knee)  Pertinent problems: Kristopher [name] has Chronic low back pain (1ry area of pain) (Bilateral) (L>R); Lumbar spondylosis; Chronic lumbar radicular pain (S1 dermatomal) (Left); 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); Chronic neck pain (posterior midline) (Bilateral) (L>R); Cervical spondylosis; and Chronic pain syndrome on their pertinent problem list.   Pain Assessment: Severity of Chronic pain is reported as a 3 /10. Location: Back Lower/Leg leg. Onset: More than a month ago. Quality: Sharp, Constant. Timing: Constant. Modifying factor(s): Medication. Vitals:  height is 5' 9 (1.753 m) and weight is 210 lb (95.3 kg). His temporal temperature is 97.2 F (36.2 C) (abnormal). His blood pressure is 116/88 and his pulse is 64. His respiration is 20 and oxygen saturation is 99%.  BMI: Estimated body mass index is 31.01 kg/m as calculated from the following:   Height as of this encounter: 5' 9  (1.753 m).   Weight as of this encounter: 210 lb (95.3 kg).  Last encounter: 10/23/2023. Last procedure: Visit date not found.  Reason for encounter: medication management.  The patient indicates doing well with current medication regimen; however we discussed to transition back to his Morphine  15 mg as it works better versus Oxycodone  HCl 10 mg, and provide adequate relief without any side effects or adverse reaction.  The patient continues experiencing low back pain radiate down to the left leg to below knee area however the current pain medication regimen managing pain level and functional ability.  Pharmacotherapy Assessment   Analgesic: Morphine  (MS IR) 15 mg tablet every 6 hours as needed for moderate pain or severe pain. MME=60  (Started on 11/19/2023) Oxycodone  HCl 10 mg every 6 hours as needed for pain for up to 30 days. MME=60 (Discontinue 11/19/2023) Monitoring: [place] PMP: PDMP reviewed during this encounter.       Pharmacotherapy: No side-effects or adverse reactions reported. Compliance: No problems identified. Effectiveness: Clinically acceptable.  Erlene Doyal SAUN[place], [place]  11/19/2023  9:16 AM  Sign when Signing Visit Nursing Pain Medication Assessment:  Safety precautions to be maintained throughout the outpatient stay will include: orient to surroundings, keep bed in low position, maintain call bell within reach at all times, provide assistance with transfer out of bed and ambulation.  Medication Inspection Compliance: Pill count conducted under aseptic conditions, in front of the patient. Neither the pills nor the bottle was removed from the patient's sight at any time. Once count was completed pills were immediately returned to the patient in their original bottle.  Medication: Oxycodone  IR Pill/Patch Count: 15 of 120 pills/patches remain Pill/Patch Appearance: Markings  consistent with prescribed medication Bottle Appearance: Standard pharmacy container. Clearly labeled. Filled  Date: 08 / 21 / 2025 Last Medication intake:  Today    UDS:  Summary  Date Value Ref Range Status  05/07/2023 FINAL  Final    Comment:    ==================================================================== [employer] [facility] 13 (MW) ==================================================================== Test                             Result       Flag       Units  Drug Present and Declared for Prescription Verification   Morphine                        [phone]       EXPECTED   ng/mg creat   Normorphine                    113          EXPECTED   ng/mg creat    Potential sources of large amounts of morphine  in the absence of    codeine include administration of morphine  or use of heroin.     Normorphine is an expected metabolite of morphine .  ==================================================================== Test                      Result    Flag   Units      Ref Range   Creatinine              72               mg/dL      >=79 ==================================================================== Declared Medications:  The flagging and interpretation on this report are based on the  following declared medications.  Unexpected results may arise from  inaccuracies in the declared medications.   **Note: The testing scope of this panel includes these medications:   Morphine    **Note: The testing scope of this panel does not include the  following reported medications:   Albuterol  (Ventolin  HFA)  Amlodipine  (Norvasc )  Budesonide (Breztri  Aerosphere)  Diphenhydramine  (Benadryl )  Empagliflozin  (Jardiance )  Esomeprazole  (Nexium )  Formoterol (Breztri  Aerosphere)  Glycopyrrolate (Breztri  Aerosphere)  Hydralazine  (Apresoline )  Metformin   Metoprolol  (Toprol )  Montelukast  (Singulair )  Multivitamin  Naloxone  (Narcan )  Nortriptyline  (Pamelor )  Olodaterol (Stiolto Respimat )  Potassium Chloride   Semaglutide  (Rybelsus )  Sumatriptan  (Imitrex )  Tiotropium (Stiolto Respimat )  Topical  Lidocaine  ==================================================================== For clinical consultation, please call [phone]. ====================================================================     No results found for: CBDTHCR No results found for: D8THCCBX No results found for: D9THCCBX  ROS  Constitutional: Denies any fever or chills Gastrointestinal: No reported hemesis, hematochezia, vomiting, or acute GI distress Musculoskeletal: Back Pain (Radiates down left side, down left leg to below knee) Neurological: No reported episodes of acute onset apraxia, aphasia, dysarthria, agnosia, amnesia, paralysis, loss of coordination, or loss of consciousness  Medication Review  Accu-Chek FastClix Lancets, Oxycodone  HCl, Potassium Chloride  ER, SUMAtriptan , Semaglutide , Tiotropium Bromide-Olodaterol, albuterol , amLODipine , atorvastatin , budesonide-glycopyrrolate-formoterol, diphenhydrAMINE , empagliflozin , esomeprazole , glucose blood, hydrALAZINE , lidocaine , metFORMIN , metoprolol  succinate, metoprolol  tartrate, montelukast , morphine , multivitamin with minerals, naloxone , and nortriptyline   History Review  Allergy: Kristopher [name] is allergic to acetaminophen , aspirin, epinephrine, novocain [procaine], penicillins, strawberry extract, and shellfish allergy. Drug: Kristopher [name]  reports current drug use. Drug: Morphine . Alcohol:  reports no history of alcohol use. Tobacco:  reports that he quit smoking about 4 years ago. His smoking use included  cigarettes. He started smoking about 39 years ago. He has a 8.8 pack-year smoking history. He has never used smokeless tobacco. Social: Kristopher [name]  reports that he quit smoking about 4 years ago. His smoking use included cigarettes. He started smoking about 39 years ago. He has a 8.8 pack-year smoking history. He has never used smokeless tobacco. He reports current drug use. Drug: Morphine . He reports that he does not drink alcohol. Medical:  has a past  medical history of Allergy, Arthritis, Benign hypertensive kidney disease, Chronic back pain, Diabetes mellitus, type 2 (HCC), Dyspnea, GERD (gastroesophageal reflux disease), Hypertension, Malignant neoplasm of lung (HCC), and Migraines. Surgical: Kristopher [name]  has a past surgical history that includes Back surgery; Appendectomy; Leg Surgery; Knee surgery (Left); Foot surgery (Left); Colonoscopy with propofol  (N/A, 02/19/2016); polypectomy (N/A, 02/19/2016); Total hip arthroplasty (Left, 12/02/2017); Colonoscopy with propofol  (N/A, 01/19/2018); polypectomy (01/19/2018); DG OPERATIVE LEFT HIP ([facility] HX); Joint replacement (Left, 12/2017); Video bronchoscopy (Left, 01/14/2019); Thoracotomy (Left, 01/14/2019); Flexible bronchoscopy (Bilateral, 01/20/2019); Flexible bronchoscopy (Bilateral, 01/22/2019); Electormagnetic navigation bronchoscopy (Left, 11/18/2018); Lung cancer surgery; Video bronchoscopy with endobronchial ultrasound (N/A, 10/15/2019); Video bronchoscopy with endobronchial navigation (N/A, 10/15/2019); Portacath placement (N/A, 11/11/2019); Colonoscopy with propofol  (N/A, 03/16/2021); and Colonoscopy with propofol  (N/A, 04/06/2021). Family: family history includes Cancer in his father; Diabetes in his sister; Heart disease in his mother; Thrombosis in his sister.  Laboratory Chemistry Profile   Renal Lab Results  Component Value Date   BUN 14 09/04/2023   CREATININE 0.93 09/04/2023   BCR 15 09/04/2023   GFRAA >60 12/01/2019   GFRNONAA >60 07/05/2021    Hepatic Lab Results  Component Value Date   AST 27 09/04/2023   ALT 24 09/04/2023   ALBUMIN 4.5 09/04/2023   ALKPHOS 144 (H) 09/04/2023   LIPASE 50 02/17/2019    Electrolytes Lab Results  Component Value Date   NA 142 09/04/2023   K 4.1 09/04/2023   CL 101 09/04/2023   CALCIUM  9.6 09/04/2023   MG 2.4 01/25/2019   PHOS 3.5 01/23/2019    Bone No results found for: VD25OH, VD125OH2TOT, CI6874NY7, CI7874NY7, 25OHVITD1,  25OHVITD2, 25OHVITD3, TESTOFREE, TESTOSTERONE  Inflammation (CRP: Acute Phase) (ESR: Chronic Phase) Lab Results  Component Value Date   CRP 0.7 04/03/2015   ESRSEDRATE 11 10/21/2017   LATICACIDVEN 1.7 01/16/2019         Note: Above Lab results reviewed.  Recent Imaging Review  CT Chest W Contrast CLINICAL DATA:  Follow-up lung cancer. LEFT upper lobe lobectomy November 2020. Post chemo radiation therapy 2021. * Tracking Code: BO *  EXAM: CT CHEST WITH CONTRAST  TECHNIQUE: Multidetector CT imaging of the chest was performed during intravenous contrast administration.  RADIATION DOSE REDUCTION: This exam was performed according to the departmental dose-optimization program which includes automated exposure control, adjustment of the mA and/or kV according to patient size and/or use of iterative reconstruction technique.  CONTRAST:  75mL OMNIPAQUE  IOHEXOL  300 MG/ML  SOLN  COMPARISON:  Chest CT 05/08/2022  FINDINGS: Cardiovascular: No significant vascular findings. Normal heart size. No pericardial effusion.  Mediastinum/Nodes: No axillary or supraclavicular adenopathy. No mediastinal or hilar adenopathy. No pericardial fluid. Esophagus normal.  Lungs/Pleura: LEFT upper lobectomy anatomy. Mild consolidation about the LEFT hilum with air bronchograms typical of radiation change. No LEFT lung nodularity.  Subpleural nodule in the RIGHT upper lobe posteriorly measures 5 mm (image 81/3) not changed from prior.  Upper Abdomen: Limited view of the liver, kidneys, pancreas are unremarkable. Normal adrenal glands.  Musculoskeletal: No  aggressive osseous lesion.  IMPRESSION: 1. LEFT upper lobectomy anatomy. No evidence of local tumor recurrence. 2. Stable radiation change about the LEFT hilum. 3. Stable RIGHT upper lobe pulmonary nodule. 4. No evidence of metastatic adenopathy.  Electronically Signed   By: Jackquline Boxer M.D.   On: 11/27/2022 10:39 Note:  Reviewed        Physical Exam  Vitals: BP 116/88 (BP Location: Right Arm, Patient Position: Sitting)   Pulse 64   Temp (!) 97.2 F (36.2 C) (Temporal)   Resp 20   Ht 5' 9 (1.753 m)   Wt 210 lb (95.3 kg)   SpO2 99%   BMI 31.01 kg/m  BMI: Estimated body mass index is 31.01 kg/m as calculated from the following:   Height as of this encounter: 5' 9 (1.753 m).   Weight as of this encounter: 210 lb (95.3 kg). Ideal: Ideal body weight: 70.7 kg (155 lb 13.8 oz) Adjusted ideal body weight: 80.5 kg (177 lb 8.3 oz) General appearance: Well nourished, well developed, and well hydrated. In no apparent acute distress Mental status: Alert, oriented x 3 (person, place, & time)       Respiratory: No evidence of acute respiratory distress Eyes: PERLA   Assessment   Diagnosis Status  1. Chronic pain syndrome   2. 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)   3. Chronic low back pain (1ry area of Pain) (Bilateral) (L>R)   4. Chronic lower extremity pain (2ry area of Pain) (Left)   5. Pharmacologic therapy   6. Medication management    Controlled Controlled Controlled   Updated Problems: No problems updated.  Plan of Care  Problem-specific:  Assessment and Plan  The patient will continue the current medication regimen.  Prescribed pain drug monitoring (PDMP) was reviewed: Findings consistent with the use of prescribed medication and no evidence of narcotic misuse or abuse.  No additional concerns were reported at this visit.  No side effects or adverse effects reported at this visit. Educate patient to drink adequate water  and increase fiber in diet to prevent constipation related to pain medications.  Schedule follow-up in 90 days for medication management.   Kristopher [name] has a current medication list which includes the following long-term medication(s): albuterol , albuterol , amlodipine , atorvastatin , esomeprazole , hydralazine , metformin , metoprolol   succinate, metoprolol  tartrate, montelukast , nortriptyline , potassium chloride  er, and sumatriptan .  Pharmacotherapy (Medications Ordered): Meds ordered this encounter  Medications   morphine  (MSIR) 15 MG tablet    Sig: Take 1 tablet (15 mg total) by mouth every 6 (six) hours as needed for moderate pain (pain score 4-6) or severe pain (pain score 7-10). Must last 30 days.    Dispense:  120 tablet    Refill:  0    Chronic Pain: STOP Act (Not applicable) Fill 1 day early if closed on refill date. Avoid benzodiazepines within 8 hours of opioids   morphine  (MSIR) 15 MG tablet    Sig: Take 1 tablet (15 mg total) by mouth every 6 (six) hours as needed for moderate pain (pain score 4-6) or severe pain (pain score 7-10). Must last 30 days.    Dispense:  120 tablet    Refill:  0    Chronic Pain: STOP Act (Not applicable) Fill 1 day early if closed on refill date. Avoid benzodiazepines within 8 hours of opioids   morphine  (MSIR) 15 MG tablet    Sig: Take 1 tablet (15 mg total) by mouth every 6 (six) hours as needed  for moderate pain (pain score 4-6) or severe pain (pain score 7-10). Must last 30 days.    Dispense:  120 tablet    Refill:  0    Chronic Pain: STOP Act (Not applicable) Fill 1 day early if closed on refill date. Avoid benzodiazepines within 8 hours of opioids   Orders:  No orders of the defined types were placed in this encounter.       Return in about 3 months (around 02/18/2024) for (F2F), (MM), Emmy Blanch NP.    Recent Visits Date Type Provider Dept  10/23/23 Office Visit Danamarie Minami K, NP [facility]  09/22/23 Office Visit Mika Anastasi K, NP [facility]  Showing recent visits within past 90 days and meeting all other requirements Today's Visits Date Type Provider Dept  11/19/23 Office Visit Nikhita Mentzel K, NP [facility]  Showing today's visits and meeting all other requirements Future Appointments No visits were found meeting these  conditions. Showing future appointments within next 90 days and meeting all other requirements  I discussed the assessment and treatment plan with the patient. The patient was provided an opportunity to ask questions and all were answered. The patient agreed with the plan and demonstrated an understanding of the instructions.  Patient advised to call back or seek an in-person evaluation if the symptoms or condition worsens.  I personally spent a total of 30 minutes in the care of the patient today including preparing to see the patient, getting/reviewing separately obtained history, performing a medically appropriate exam/evaluation, counseling and educating, placing orders, documenting clinical information in the EHR, and independently interpreting results.   Duration of encounter:  minutes.  Total time on encounter, as per AMA guidelines included both the face-to-face and non-face-to-face time personally spent by the physician and/or other qualified health care professional(s) on the day of the encounter (includes time in activities that require the physician or other qualified health care professional and does not include time in activities normally performed by clinical staff). Physician's time may include the following activities when performed: Preparing to see the patient (e.g., pre-charting review of records, searching for previously ordered imaging, lab work, and nerve conduction tests) Review of prior analgesic pharmacotherapies. Reviewing PMP Interpreting ordered tests (e.g., lab work, imaging, nerve conduction tests) Performing post-procedure evaluations, including interpretation of diagnostic procedures Obtaining and/or reviewing separately obtained history Performing a medically appropriate examination and/or evaluation Counseling and educating the patient/family/caregiver Ordering medications, tests, or procedures Referring and communicating with other health care professionals  (when not separately reported) Documenting clinical information in the electronic or other health record Independently interpreting results (not separately reported) and communicating results to the patient/ family/caregiver Care coordination (not separately reported)  Note by: Amberrose Friebel K Trever Streater, NP (TTS and AI technology used. I apologize for any typographical errors that were not detected and corrected.) Date: 11/19/2023; Time: 9:21 AM

## 2023-11-19 NOTE — Progress Notes (Signed)
 Nursing Pain Medication Assessment:  Safety precautions to be maintained throughout the outpatient stay will include: orient to surroundings, keep bed in low position, maintain call bell within reach at all times, provide assistance with transfer out of bed and ambulation.  Medication Inspection Compliance: Pill count conducted under aseptic conditions, in front of the patient. Neither the pills nor the bottle was removed from the patient's sight at any time. Once count was completed pills were immediately returned to the patient in their original bottle.  Medication: Oxycodone  IR Pill/Patch Count: 15 of 120 pills/patches remain Pill/Patch Appearance: Markings consistent with prescribed medication Bottle Appearance: Standard pharmacy container. Clearly labeled. Filled Date: 08 / 21 / 2025 Last Medication intake:  Today

## 2023-11-19 NOTE — Patient Instructions (Signed)
 ______________________________________________________________________    Update on Controlled Substance (Opioid) Regulations   To: All patients taking opioid pain medications. (I.e.: hydrocodone, hydromorphone , oxycodone , oxymorphone, morphine , codeine, methadone, tapentadol, tramadol , buprenorphine, fentanyl , etc.)  Re: Review on the state of controlled substance regulations.  Introduction: Rules and regulations associated with all aspects of controlled substances are constantly being modified. Unfortunately we have encountered patients questioning the veracity of the information that we provide them about these changes. This is intended to provide them with appropriate references and a historical review of these changes.  A Brief History: As of December 18, 2015, the [place]  Government declared the opioid epidemic a public health emergency. Prescription drug monitoring programs (PDMPs) and the [facility] All Schedules Prescription Electronic Reporting Act (NASPER). Before 1800, clinicians regarded pain as an existential phenomenon, a consequence of aging. There was no regulation on the use of cocaine and opioids, resulting in widespread marketing and prescribing for many ailments ranging from diarrhea to toothache. The [employer] of [phone], passed in response to the sudden emergence of street heroin abuse as well as iatrogenic morphine  dependence, influenced both physician and patient alike to avoid opiates. Patients with unexplained pain in the 1920s were regarded as deluded, malingering, or abusers, and cancer patients through the 1950s were encouraged to wean themselves off opioids until their lives "could be measured in weeks". Alongside this opioid evolution, the American Pain Society launched their influential "pain as the fifth vital sign" campaign in 1995. Concurrently, pharmaceutical companies introduced new formulations, such as extended release oxycodone  (OxyContin ). From 1997  to 2002, OxyContin  prescriptions increased from 670,000 to 6.2 million. However, concerns soon began to surface regarding overzealous opioid treatment. It must be noted that pharmaceutical companies contributed significantly to the rise of the opioid epidemic, receiving considerable reprimands as a consequence. In 2007, as the opioid epidemic began to inflict profound damage, [employer] pleaded guilty to federal charges related to the misbranding of OxyContin . [employer] agreed to pay a total of $634.5 million to resolve Justice Department investigations, as well as a $19.5 million settlement to [address] and the [address] of [place].  In response to the current epidemic, changes in focus to the development of new abuse deterrent opioid formulations at the [place]  Food and Drug Administration (FDA) as well as drafting of new public standards for pain treatment were created at [facility] in 2017. In response to the opioid epidemic, FDA public policy changes were announced in February 2016. Among these new positions were a re-examination of the risk-benefit paradigm for opioids with strict emphasis on the large public health ramifications. The various modified opioids released over the past 20 years, such as tamper-resistant preparation, have had differing levels of success, and are collectively referred to as Risk Evaluation and Mitigation Strategies (REMS). There is also a growing focus on preventing opioid use disorder (OUD) and on offering affected individuals accessible and effective treatment. [place]  government policy reflects these changes and both the Affordable Care Act and the Mental Health Parity and Addiction Equity Act were major steps forward in treating opioid addiction. The Affordable Care Act, which was signed into law in 2010, with major provisions coming into effect by 2014.  In the 1990s, the intensified marketing of newly reformulated prescription opioid medications (e.g., OxyContin ) and an influential pain  advocacy campaign that encouraged greater pain management led to a precipitous rise in opioid use in the [place] . Research from the Centers for Disease Control and Prevention (CDC) shows that prescription opioid sales in  the [place]  quadrupled from December 20, 1997 to 20-Dec-2008. At the same time, opioid misuse and opioid-involved overdose deaths increased (Figure 1). Between 1997/12/20 and 2008-12-20, the rate of opioidinvolved overdose deaths in the [place]  doubled from 2.9 to 6.8 deaths per 100,000 people. This initial rise in opioid-related deaths is often referred to as the first wave of the recent opioid crisis.  Between 12-20-97 and 2018/12/21, 565,000 Americans died of opioid-involved overdoses. In turn, federal, state, and local governments responded with various legal and policy efforts to curb opioid misuse and drug-related overdose Deaths.  Recent Congresses have enacted several laws addressing the opioid crisis, such as the Comprehensive Addiction and Recovery Act of 21-Dec-2014 (CARA, P.L. 114-198); the 21st Century Cures Act (P.L. 114-255); the Substance UseDisorder Prevention that Promotes Opioid Recovery and Treatment for Patients and Communities Act (SUPPORT Act, P.L. [phone]); the Fentanyl  Sanctions Act (Title LXXII of P.L. [id number]); and the Blocking Deadly Fentanyl  Imports Act (P.L. 117-81, 6610). These laws addressed overprescribing and misuse of opioids, expanded substance use disorder prevention and treatment capacities, bolstered drug diversion capabilities, and enhanced international drug interdiction, counternarcotics cooperation, and sanctions efforts. Congress also directed additional funds to many of these initiatives through appropriations.  Congress provided funding in the [place].[employer] Act of 21-Dec-2019 [phone]; P.L. 117-2) for syringe services programs (often known as needle exchange programs) and other harm reduction initiatives. Federal and state harm reduction strategies  have frequently involved the distribution of naloxone  (e.g., Narcan )--a medication used to reverse an opioid overdose--and test strips used to detect fentanyl  in drug samples.  The Department of Justice (DOJ) and Department of Homeland Security [facility]) aim to reduce the diversion of prescription opioids and the use, manufacturing, and trafficking of illicit opioids. DOJ--via the Drug Enforcement Administration (DEA)--regulates opioid manufacturers, distributors, and dispensers; it also controls the opioid supply through enforcement of regulatory requirements.  A History of Opiate Laws in the [place]   Prior to 1888/12/20, laws concerning opiates were strictly imposed on a local city or state-by-state basis. One of the first was in [place] in 12-20[birth year]73 where it became illegal to smoke opium  only in opium  dens. It did not ban the sale, import or use otherwise. In the next 25 years different states enacted opium  laws ranging from outlawing opium  dens altogether to making possession of opium , morphine  and heroin without a physician's prescription illegal.  The first Congressional Act took place in 20-[birth year]-1888 that levied taxes on morphine  and opium . From that time on the [employer] has had a series of laws and acts directly aimed at opiate use, abuse and control. These are outlined below:  1906 - Pure Food and Drug Act Preventing the manufacture, sale, or transportation of adulterated or misbranded or poisonous or deleterious foods, drugs, medicines, and liquors, and for regulating traffic therein, and for other purposes. Punishment included fines and prison time.  1909   - Smoking Opium  Exclusion Act Banned the importation, possession and use of smoking opium . Did not regulate opium -based medications. First Freight forwarder banning the non-medical use of a substance.  1914  - The Margrette Act In summary, The Margrette Act of December 20, 1912 was written more to have all parties involved in importing,  exporting, Set designer and distributing opium  or cocaine to register with the [employer] and have taxes levied upon them. Exempt from the law were physicians operating "in the course of his professional practice"  1917/12/20 - Supreme Court ratified the [employer] in [employer] al., v. [place]   and [place]  v. [place], then again in [facility] v. [place] , in 1920, holding that doctors may not prescribe maintenance supplies of narcotics to people addicted to narcotics. However, it does not prohibit doctors from prescribing narcotics to wean a patient off of the drug. It was also the opinion of the court that prescribing narcotics to habitual users was not considered "professional practice" hence it then was considered illegal for doctors to prescribe opioids for the purposes of maintaining an addiction. It can be argued that today's addiction medications are not intended to maintain an addiction but to facilitate addiction remission. In which case, this opinion of the court should not preclude practitioners from prescribing buprenorphine or methadone to patients suffering from an addictive disorder.  1924  - Heroin Act Architectural technologist, importation and possession of heroin illegal - even for medicinal use.  1922 -- Narcotic Drug Import and Export Act Enacted to assure proper control of importation, sale, possession, production and consumption of narcotics.  1927  -- Special educational needs teacher of Prohibition [employer] of Prohibition was responsible for tracking bootleggers and organized Conservation officer, historic buildings. They focused primarily on interstate and international cases and those cases where local law enforcement official would not or could not act.  1932 -- Uniform State Narcotic Act Encouraged states to pass uniform state laws matching the federal Narcotic Drug Import and Export Act. Suggested prohibiting cannabis use at the state level.  31 -- Food, Drug, and Cosmetic Act The new law  brought cosmetics and medical devices under control, and it required that drugs be labeled with adequate directions for safe use. Moreover, it mandated pre-market approval of all new drugs, such that a manufacturer would have to prove to FDA that a drug were safe before it could be sold  1951 -- Boggs Act Imposed maximum criminal penalties for violations of the import/export and internal revenue laws related to drugs and also established mandatory minimum prison sentences.  1956 -- Narcotics Control Act Increased Boggs Act penalties and mandatory prison sentence minimums for violations of existing drug laws.  1965 -- Drug Abuse Control Amendment Enacted to deal with problems caused by abuse of depressants, stimulants and hallucinogens. Restricted research into psychoactive drugs such as LSD by requiring FDA approval.  1970 -- Controlled Substance Act  Controlled Substances Import and Export Act These laws are a consolidation of numerous laws regulating the manufacture and distribution of narcotics, stimulants, depressants, hallucinogens, anabolic steroids, and chemicals used in the illicit production of controlled substances. The CSA places all substances that are regulated under existing federal law into one of five schedules. This placement is based upon the substance's medicinal value, harmfulness, and potential for abuse or addiction. Schedule I is reserved for the most dangerous drugs that have no recognized medical use, while Schedule V is the classification used for the least dangerous drugs. The act also provides a mechanism for substances to be controlled, added to a schedule, decontrolled, removed from control, rescheduled, or transferred from one schedule to another.  27 - Drug Enforcement Agency By Executive Order, the DEA was formed to take place of the [employer] of Narcotics and Dangerous Drugs.  40 -Narcotic Addict Treatment Act of  1974  - Public Law [phone] Amends the Controlled  Substance Act of 1970 to provide for the registration of practitioners conducting narcotic treatment programs. [methadone clinics] It also provides legal definitions for the phrases "maintenance treatment" and "detoxification treatment".  1986 -- Anti-Drug Abuse Act of 1986 Strengthened Federal efforts to  encourage foreign cooperation in eradicating illicit drug crops and in halting international drug traffic, to improve enforcement of Federal drug laws and enhance interdiction of illicit drug shipments, to provide strong Market researcher in establishing effective drug abuse prevention and education programs, to expand Federal support for drug abuse treatment and rehabilitation efforts, and for other purposes. It also re-imposed mandatory sentencing minimums depending on which drug and how much was involved.  1988 -- Anti-Drug Abuse Act of 1988 Established the Office of Materials engineer (ONDCP) in the [employer] of the Economist; authorized funds for [employer], state and local drug enforcement activities, school-based drug prevention efforts, and drug abuse treatment with special emphasis on injecting drug abusers at high risk for AIDS.  2000 -- Federal - The Drug Addiction Treatment Act of 2000 (DATA 2000) It enables qualified physicians to prescribe and/or dispense narcotics for the purpose of treating opioid dependency. For the first time, physicians are able to treat this disease from their private offices or other clinical settings. This presents a very desirable treatment option for those who are unwilling or unable to seek help in drug treatment clinics. Patients can now be treated in the privacy of their doctor's office, as are other people being treated for any other type of medical condition. One medicine doctors may now prescribe is Buprenorphine. The major downfall of this Act is the limitation of 30 patients per practice - which means that large facilities, no matter how many  physicians are there, can only treat 30 patients at a time.  2002-- DEA reschedules buprenorphine from a schedule V drug to a schedule III drug, on December 08, 2000 - the day before the FDA approval of Suboxone and Subutex despite overwhelming objection by the medical community.  2004: June 2004 THE CONFIDENTIALITY OF ALCOHOL AND DRUG ABUSE PATIENT RECORDS REGULATION AND THE HIPAA PRIVACY RULE:  Confidentiality of Alcohol and Drug Dependence Patient Records (summary) Code of Federal Regulations Title 42 Part 2 (42 CFR Part 2)  The confidentiality of alcohol and drug dependence patient records maintained by this practice/program is protected by federal law and regulations. Generally, the practice/program may not say to a person outside the practice/program that a patient attends the practice/program, or disclose any information identifying a patient as being alcohol or drug dependent unless:  The patient consents in writing; The disclosure is allowed by a court order, or The disclosure is made to medical personnel in a medical emergency or to qualified personnel for research,  audit, or practice/program evaluation. Violation of the federal law and regulations by a practice/program is a crime. Suspected violations may be reported to appropriate authorities in accordance with federal regulations. Freight forwarder and regulations do not protect any information about a crime committed by a patient either at the practice/program or against any person who works for the practice/program or about any threat to commit such a crime. Federal laws and regulations do not protect any information about suspected child abuse or neglect from being reported under state law to appropriate state or local authorities.  sample consent form (MS-WORD)  2005: 10-04-2003 Public law [phone], Amends the Controlled Substances Act to eliminate the 30-patient limit for medical group practices allowed to dispense narcotic drugs in  schedules III, IV, or V for maintenance or detoxification treatment (retains the 30-patient limit for an individual physician). This amendment removes the 30-patient limit on group medical practices that treat opioid dependence with buprenorphine. The restriction was part of the original Drug Addiction Treatment  Act of 2000 (DATA) that allowed treatment of opioid dependence in a doctor's office. With this change, every certified doctor may now prescribe buprenorphine up to his or her individual physician limit of 30 patients.  2006: On 03/01/2005 President Levy signed Bill H.R.6344 into law. This allows physicians who have been certified to prescribe certain drugs for the treatment of opioid dependence under DATA2000 to treat up to 100 patients (up from 30) by submitting an intent notification to the Dept of Health and [employer]. This is a major step forward in both fighting the stigma and allowing access to treatment previously not available to some. For more details see 30/100-PATIENT LIMIT  2016: HHS augments regulations concerning the 30/100 patient limit by raising the limit to 275 for qualifying physicians. Link to summary of regulation  2016: Comprehensive Addiction and Recovery Act of 2016 (sec.303) amends the Controlled Substance Act - to allow Nurse Practitioners and Physician Assistants to become eligible to prescribe buprenorphine for the treatment of opioid use disorder. See the entire law for more details.  The roots of the concurrent regulation of certain drugs under two statutory schemes go back to the beginning of this century. In 1906, Congress enacted the Pure Food and Drug Act, establishing one regime of regulation to assure (among other things) that drugs were not adulterated or misbranded. These regulations were amended several times, recodified in 1938, and expanded on again from the 1940s through the 1990s. Their implementation and enforcement is today assigned to the Food and  Drug Administration (FDA) in the Department of Health and Human Services [facility]).  In 1914, Congress adopted the [place] Narcotic Act to stop abuse of addictive drugs. The Margrette Narcotic Act was amended in 1937 to include marijuana. In 1965, amphetamines, barbiturates, and hallucinogens came under regulation, but under the [employer], Drug, and Cosmetic Act. In 1970, these various statutes were consolidated and recodified as the Controlled Substances Act (CSA), which has been amended several times since then. Its implementation and enforcement is today assigned to the Drug Enforcement Administration (DEA) in the Department of Justice.  The first clash occurred after World War I, when so-called morphine  clinics existed and physicians prescribed or dispensed morphine  to addicts. Some addicts were veterans of the American Civil War, the Spanish-American War, and WWI, who had become addicted during treatment for war wounds, but most of them came from the growing population of nonmedical addicts ([place]wright, 8017). The Narcotics Division of the Prohibition Unit of the Department of the Treasury, which was then responsible for enforcing the [facility] Narcotic Act, concluded that this activity was not the legitimate practice of medicine but simple drug trafficking. The Treasury Department swiftly closed the clinics and made it personally and professionally risky for physicians to maintain a narcotic addict for any reason. In did so, however, only after the American Medical Association had adopted a resolution, in 1920, opposing ambulatory clinics''.  In 1972, the public health establishment, including the Secretary of Health, Education, and Welfare, the Education officer, environmental, the [employer] of [employer], and the Chemical engineer for Drug Abuse Prevention, was unprepared to allow [employer] of Narcotics and Dangerous Drugs, DEA's predecessor agency, to unilaterally define the  parameters of medical practice for the use of methadone in the treatment of heroin addiction. As a consequence, a new set of rules--the third, on top of the FDA and DEA schemes--was added, one that inserted FDA deeply into the practice of medicine, notwithstanding its protestations to the  contrary. Congress ratified this joint responsibility of law enforcement and public health officials for methadone through this third set of rules in 1974 with the passage of the Narcotic Addict Treatment Act (NATA). To examine in detail the evolution of this third set of rules--commonly referred to as the FDA or DHHS methadone regulations--we turn, first, to the period of the mid-1960s.  Increased use of heroin in the post World War II period first became apparent in the early to mid 1950s. During the [employer], a minimum mandatory narcotics law was enacted in 1956, effective July 1957. 1962 [facility] conference on drug abuse, the [employer] on Narcotic and Drug Abuse (the [employer]) of 1963, the Drug Abuse Control Amendments of 1965, the President's Commission on [employer] and Administration of Justice (the [employer]) of [phone], and the Narcotic Addiction Rehabilitation Act of 1966.  The 1965 Drug Abuse Control Amendments brought under strict federal control all nonnarcotic drugs capable of producing serious psychotoxic effects when abused. This act also created the [employer] of Drug Abuse Control within the Department of Health, Education, and Welfare (DHEW) and shifted the basis for [employer] of illegal drugs from tax principles (administered by the Department of Treasury) to the regulation of commerce (administered by the [employer]).  The 1966 Narcotic Addiction Rehabilitation Act Tour manager) authorized the civil commitment of narcotic addicts, and federal assistance to state and local governments to develop a local system of drug treatment programs.  With respect to the latter, the [employer] of Mental Health [facility]) initially proposed the gradual implementation of the state assistance effort, mainly through a common mental health mechanism--inpatient treatment programs. However, because of a perceived pressing need, the courts began to commit addicts to these programs even before they were officially opened or staffed. The NARA legislation imposed the following contract requirements on treatment centers: (1) thrice-a-week counseling sessions; (2) weekly urine tests; (3) restorative dental services; (4) psychological consultations and vocational training; and (5) the treatment modalities of drug-free outpatient, therapeutic community, and methadone maintenance. Reorganization Plan No. 1 of 1968 transferred the primary functions of the [employer] of Narcotics (FBN) from the [employer] to the Department of Justice; it also transferred the [employer] of Drug Abuse Control functions to the Department of Justice. Within the [employer], the [employer] of Narcotics and Dangerous Drugs (BNDD) was created, which became the Drug Enforcement Administration in 1973.   Under the first [place] administration [phone]), federal drug abuse policy developed in a significant way. These developments included a 1969 war on drugs presidential message, resulting legislation in 1970, and a Special Action Office created by executive order in 1971 and authorized in statute in 1972. Brynn, in 1969, to send a message to Congress on drug abuse. Although this was the first time that a U.S. president invoked the war on drugs image, it was in retrospect the most balanced approach to the problem of drug abuse that had been advanced. The 1969 message resulted in the submission of legislation to the Congress and the passage, the following year, of the Comprehensive Drug Abuse Prevention and Control Act of 1970 [employer] [phone], December 28, 1968). The act dealt  with research, treatment, and prevention of drug abuse and drug dependence, and with drug abuse Charity fundraiser. One major purpose of the 1970 legislation was to reverse some of the strictures of the [employer] of 1914. The 1970 act sought to clarify for the medical profession . . . the  extent to which they may safely go in treating narcotic addicts as patients. Title I, in Section IV, charged the Surveyor, minerals, Education, and Welfare, to determine the appropriate methods of professional practice in the medical treatment of the narcotic addiction of various classes of narcotic addicts. This provision constitutes the initial statutory basis for treatment standards. The law enforcement sections consolidated all prior federal statutes into the Controlled Substances Act and the Controlled Substances Export and Import Act (Titles II and III, respectively, of the Comprehensive Drug Abuse Prevention and Control Act of 1970). Under this legislation, substances were classified under five schedules according to their abuse potential, and psychological and physical effects. Methadone was placed in Schedule II, along with such opiate drugs as morphine , codeine, and hydrocodone.  One of the most important steps taken by President Brynn was to establish in June 1971 the Special Action Office for Drug Abuse Prevention (SAODAP) in the [employer] of the President ([place] [phone], August 18, 1969). In mid-1971, [facility] appointed Dr. Maple Dunnings as SAODAP director. Within a year, the Drug Abuse Prevention Office and Treatment Act of 1972 [employer] [phone], May 23, 1970) gave statutory authority to [facility], but limiting setting, on August 31, 1973, as the limit on its existence.  The purpose of the 1972 act was to bring the resources of the federal government to bear on drug abuse with the immediate objective of significantly reducing its incidence and developing a comprehensive, coordinated  long-term federal strategy to combat drug abuse.  Narcotic Addict Treatment Act [employer]) of 1974 [employer] [phone]), which amended the Controlled Substances Act. This legislation was driven by concern for the diversion of methadone to illicit channels that was occurring in 1972 and 1973, as reflected in the title of the Senate bill adopted on August 10, 1971, the Methadone Diversion Control Act of 1973. (U.S. Senate, 1970a, 8029a).  The 1980 final rule (45 FR 37305, November 21, 1978) reduced the minimum standard for admission from two years of addiction to one year coupled with a clinical determination that the individual was currently physiologically.  The regulations were next revised in 1989, following two proposals to modify them, one in 1983 and one in 1987.  Under President Tanda Corrente, a government-wide effort was made to review all federal government regulations and to eliminate or reduce the burden of these regulations on the private sector, state and [employer], and [employer].   The 1983 recommendations, though not adopted, did initiate another revision of the methadone regulations, which first found expression in a 1987 proposed rule (52 FR 37047, December 03, 1985) and culminated in a final rule (54 FR 8954, May 04, 1987) at the end of the decade. In the 1987 proposed rule, the FDA and NIDA, in an effort to put the best face on the unenthusiastic 1983 response by the provider community to converting the regulations to guidelines, indicated that they had retained the current requirements necessary to achieve the goals of the 1974 NATA, but were proposing to streamline the regulation and to promote more efficient operation of methadone programs. The 1987 proposed rule, issued by the FDA and NIDA, advanced the following changes in the methadone regulation: that detoxification treatment be divided into short-term (<21 days) and long-term (>21 and <180 days) treatment; that  the minimum staffing ratio of one counselor to 50 patients be eliminated; that blood tests be allowed as ways to conduct initial drug screening or to meet the monthly testing requirements for  six-day take-home patients; that the 72-hour notification of FDA and the pertinent state authority for methadone doses greater than 100 mg be eliminated; that special adverse reaction reporting requirements for methadone be eliminated and reliance placed upon general FDA reporting requirements; that a supervising counselor be allowed to conduct the annual review of the patient's treatment plan for certain qualified patients who had been in treatment for 3 years or longer; and that the requirement of an annual report of methadone treatment programs to the FDA be dropped. The FDA and NIDA issued a final rule on May 04, 1987, based on comments on the 1987 proposed (54 FR 8954). Concurrently, FDA and NIDA issued a six-page guidance document, which noted that the regulations, over time, had recommended certain practices that were not actually required. Public Health Service, in Congress, and elsewhere, to reorganize the Alcohol, Drug Abuse, and Mental Health Administration (ADAMHA). These efforts culminated in the [employer] of 1992 [employer] [phone], September 11, 1990), the main purpose of which was to transfer the research portions of the three ADAMHA institutes--NIDA, the [employer] of Alcoholism and Alcohol Abuse, and the [employer] of Mental Health--to the [employer] and to create the Substance Abuse and Museum/gallery exhibitions officer [facility]) as the home for the service functions of these entitles.  Guidelines for Opioid Treatment The Federal Guidelines for Opioid Treatment Programs - 2015 serve as a guide to accrediting organizations for developing accreditation standards. The guidelines also provide OTPs with information on how programs can achieve and maintain  compliance with federal regulations. The 2015 guidelines are an update to the 2007 Guidelines for the Accreditation of Opioid Treatment Programs (PDF  547 KB). The new document reflects the obligation of OTPs to deliver care consistent with the patient-centered, integrated, and recovery-oriented standards of substance use treatment.  DPT oversees the certification of OTPs and provides guidance to nonprofit organizations and state governmental entities that want to become a SAMHSA-approved accrediting body. Learn more about the accreditation and certification of OTPs and [employer] oversight of OTP accreditation bodies.  Model Guidelines for [facility] Boards With input from [facility], the Federation of [employer] in 2013 adopted a revised version of the federation's office-based opioid treatment policies. The Model Policy on DATA 2000 and Treatment of Opioid Addiction in the Medical Office - 2013 (PDF  279 KB) provides model guidelines for use by state medical boards in regulating office-based opioid treatment.  Holiday Guidance for Opioid Treatment Programs (PDF  203 KB) In response to requests for the upcoming federal holidays and ensuing weekends (December 24th, 25th, and 26th and December 31st, Jan 1st, and Jan 2nd), this letter is to provide guidance regarding requests for unsupervised doses of medication for patients for these dates. View a sample [id number] (PDF  194 KB).  Federal regulation of drugs emerged as early as 42, under a law that addressed only imported drugs. In 1905 the [employer] launched a private, voluntary means of controlling a substantial part of the drug marketplace, a system that remained in place for over a half-century. Drug regulation in FDA has evolved considerably since President Ricardo Para signed the 1906 Pure Food and Drugs Act.  1820 Eleven doctors set up the U.S. Pharmacopeia and record the first list of standard  drugs. 1848 Drug Importation Act passed by Congress requires U.S. Customs Service inspection to stop entry of tainted, low quality drugs from overseas. 8116 Dr. Mitchell MICAEL Burrs becomes the chief chemist at  the [employer] of Chemistry's food adulteration studies.  1905 The American Medical Association [facility]) begins a voluntary program of drug approval that would last until 1955. In order to advertise in the [facility] and related journals, drug companies must show proof that the drug will treat what they claim. 1906 The original Food and Drug Act is passed by Congress on June 30 and signed by [employer]. The Act outlaws states from buying and selling food, drinks, and drugs that have been mislabeled and tainted. 1911 In U.S. v. Vicci, the [employer] that the [employer] and Drugs Act does not outlaw false medical claims but only false and misleading statements about the ingredients or identity of a drug. 1912 Congress passes the [place] Amendment to overcome the ruling in U.S. v. Vicci. The Act outlaws labeling medicines with fake medical claims that is meant to trick the buyer. 1930 The name of the Food, Drug, and Insecticide Administration is shortened to Food and Drug Administration (FDA) under an Therapist, music. 1933 FDA recommends a total rewrite of the out-of-date 1906 Food and Drugs Act.   1937 Elixir Sulfanilamide, contain the poisonous liquid, diethylene glycol, kills 107 persons, many of whom are children, dramatizing the need to establish drug safety before marketing and to pass the pending food and drug law. 1938 Congress passes [employer], Drug, and Cosmetic (FDC) Act of 1938, which requires that new drugs show safety before selling. This starts a new system of drug regulation. The Act also requires that safe limits be set for unavoidable poisonous matter and allows for factory inspections. The [employer] is given  power to oversee advertising for all FDAregulated products except prescription drugs. FDA states that sulfanilamide and other dangerous drugs must be given under the direction of a medical expert. This begins the requirement for prescription only (nonnarcotic) drugs (see 1951 [place] amendment). 1941 Nearly 300 deaths and injuries result from the use of sulfathiazole tablets, an antibiotic, tainted with the sedative, phenobarbital. In response, FDA drastically changes manufacturing and quality controls. These changes lead to the development of good manufacturing practices (GMPs). 1948 The [employer] in U.S. v. Floretta that FDA jurisdiction extends to retail stores, thereby allowing FDA to stop illegal sales of drugs by pharmacies including barbiturates and amphetamines. 1950 In Wal[employer]. v. U.S., a U.S. Court of Appeals rules that the directions for use on a drug label must include the drug's purpose. 1951 Congress passes the [place] Amendment, which defines the kinds of drugs that cannot be used safely without medical supervision. The amendment limits sale of these drugs to prescription only by a medical professional. All other drugs are to be available without a prescription. 1952 A nationwide investigation by FDA reveals that chloramphenicol, an antibiotic, caused nearly 180 cases of often deadly blood diseases. Two years later FDA engages the [employer] of Hospital Pharmacists, the American Association of Medical Record Librarians, and later the American Medical Association in a voluntary program of drug reaction reporting. 1953 The [employer] Amendment clarifies previous law and requires FDA to give manufacturers written reports of conditions seen during inspections and results of factory samples. 1962 Thalidomide, a new sleeping pill, causes severe birth defects of the arms and legs in thousands of babies born in [place]. The U.S. media reports on how Dr. Cathlean Mort, a FDA medical officer, helped prevent approval and marketing of Thalidomide in the [place] . These reports stirred up public support for  stronger drug laws. 3 Congress passes the [employer]. For the first time, these laws require drug makers to prove their drug works before FDA can approve them for sale. The Advisory Committee on Investigational Drugs meets for the first time. This was the first meeting of a committee to advise FDA on product approval and policy on an ongoing basis. 1966 FDA contracts with the [employer] of [employer] to measure the effectiveness of 4,000 marketed drugs approved on the basis of safety alone between [phone] and 1960-12-01. The Fair Packaging and Labeling Act requires all consumer products, in interstate commerce, to be honestly and informatively labeled. December 02, 1966 FDA forms the Drug Efficacy Study Implementation (DESI) to carry out recommendations of the [employer] of the effectiveness of drugs first sold between [place] and Sep 30, 1962September 30, 1970 FDA requires the first patient package insert, medicines must come with information for the patient about risks and benefits. 1972 Over-the-Counter Drug Review begins to enhance the safety, effectiveness and appropriate labeling of drugs sold without prescription. 1973 The U.S. Supreme Court upholds the [place] drug effectiveness law and approves FDA's action to control entire classes of products. 1982 FDA issues Tamper-resistant Packaging Regulations to prevent poisonings such as deaths from cyanide placed in Tylenol  capsules. Congress passes the [employer] in 12-01-1981, making it a crime to tamper with packaged consumer products. Dec 02, 1982 Drug Price Advertising account planner Act (Hatch-Waxman Act) increases the availability of less costly generic drugs by allowing FDA to  approve applications for generic versions of brand-name drugs without repeating the research that proved the safety and effectiveness of the brand-name drugs. The Act also allowed brand-name companies to apply for up to five years additional patent protection for the new medicines they developed to make up for time lost while their products were going through FDA's approval process. 1989 The FDA issued guidelines asking drug makers to decide if a drug is likely to have usefulness in elderly people and to include elderly people in studies when applicable. 1991 In 12/02/79, the FDA and the Department of Health and Human Services published a policy on protecting people in research. In 12-01-1989, this policy is adopted by more than a dozen federal agencies involved in human subject research and becomes known as the Common Rule. 4 1993 FDA launches MedWatch, a system designed to collect reports from health professionals on problems with drugs and other medical products. FDA issues guidelines for measuring gender differences in responses to medication. Drug companies are encouraged to include patients of both sexes in their research of drugs and to study any gender-specific effects. 1995 FDA declares cigarettes to be drug delivery devices. Limits are issued on marketing and sales to reduce smoking by young people. 1998 FDA introduces the Adverse Event Reporting System (AERS), a computerized database designed to store and study safety reports on already marketed drugs.  The Demographic Rule requires that a marketing application review data on safety and effectiveness by age, gender, and race. The Pediatric Rule requires drug makers of selected new and existing drugs to conduct studies on drug safety and effectiveness in children. 1999 Creation of the Drug Facts Label for OTC drug products. The law requires all overthe-counter drug labels to have information in a standard format. These drug facts  labels are designed to give the user easy-to-find information. 2000 The U. S. [employer], upholds an earlier decision from The [employer] and Drug Administration v. [employer]. et al. and rules  5-4 that FDA does not have authority to regulate tobacco as a drug. 2002 The Best Pharmaceuticals for Children Act, in exchange for studying the drug in children, the drug maker gets six months of selling their product without competition. 2003 The Pediatric Research Equity Act gives FDA the right to ask drug companies to study the effectiveness of new drugs in children. 2004 FDA advises medical professionals to limit the use of a pain reliever called Cox-2, a nonsteroidal anti-inflammatory drug (NSAIDs). Studies had shown that long-term use raised chances of heart attacks and strokes. The warning is also added to the over-thecounter NSAIDs' Drug Facts label. Medicines used in hospitals must have a bar code to prevent patients from receiving the wrong medicine. 5 2005 The Drug Safety Board is formed, consisting of FDA staff and representatives from the [employer] of [address] and the [employer]. The Board advises the Director, Center for Drug Evaluation and Research, FDA, on drug safety issues and works with the agency in sharing safety information to health professionals and patients.  The [place]  Food and Drug Administration (FDA) was first created to enforce the Pure Food and Drug Act of 1906. In this capacity, the FDA is charged with protecting the health of the US  public, to ensure the quality of its food, medicine, and cosmetics. Before this time, the [place]  government had no formal oversight of these products and left issues of quality and purity to the individual manufactures, or at times, individual states.    Review: [place] Stop ACT. (The Strengthen Opioid Misuse Prevention (STOP) Act of 2017). GENERAL ASSEMBLY OF [place]  SESSION 2017  SESSION LAW 2017-74 HOUSE BILL 243  PMP mandatory The dispenser shall report: (1) The dispenser's DEA number. (2) The name of the patient for whom the controlled substance is being dispensed, and the patient's: a. Full address, including city, state, and zip code, b. Telephone number, and c. Date of birth. (3) The date the prescription was written. (4) The date the prescription was filled. (5) The prescription number. (6) Whether the prescription is new or a refill. (7) Metric quantity of the dispensed drug. (8) Estimated days of supply of dispensed drug, if provided to the dispenser. (9) National Drug Code of dispensed drug. (10) Prescriber's DEA number. (11) Method of payment for the prescription.  No paper prescriptions  Duration of scripts Acute vs Chronic prescribing  2016 CDC Guidelines for prescribing Opioids for Chronic Pain. (Updated in 2022.) Medical Board  Laws:  Prescription Laws Drug laws, rules, and regulations are constantly changing. Any attempt to summarize them would quickly become outdated. Because of that, the Board encourages practitioners who seek guidance on prescribing procedures to refer to the sources listed below in addition to the Board's position statements, rules and Medical Practice Act.  [place]  Board of Pharmacy (NCBOP) (which offers the state's pharmacy laws and rules, and links to the Code of Federal Regulations) [employer] Site: www.ncbop.org  [place]  General Statutes General Web Site: PoliticalPool.cz See: [place]  Food, Drug, and Cosmetic Act: [id number] & 106-134 See: [name]  Pharmacy Practice Act, Article 4A: [phone] See: [place]  Controlled Substances Act, Article 5: 90-86 & 90-113.8 See: Use of controlled substances to render one mentally incapacitated or physically helpless: [employer] [facility]. Code, Title 21, Food & Drugs www.deadiversion.usdoj.gov Controlled Substances Schedules  www.deadiversion.usdoj.gov Drug Warehouse manager - www.deadiversion.usdoj.gov 42 CFR  8.12 - Federal opioid treatment standards.   Effective October 29, 2015, prior approval  will be required for opioid analgesic doses for [place] and [place] [facility]) beneficiaries which:  Exceed 120 mg of morphine  equivalents (MME) per day  Are greater than a 14-day supply of any opioid, or,  Are non-preferred opioid products on the [place] Medicaid Preferred Drug List (PDL)  FEDERAL 42 CFR  8.12 - Federal opioid treatment standards. Title II of the Comprehensive Drug Abuse Prevention and Control Act of 1970, commonly known as the Controlled Substance Act (CSA) Title 21 [place]  Code ([place]) Controlled Substances Act.   Reference:   ______________________________________________________________________       ______________________________________________________________________    Medication Recommendations and Reminders  Applies to: All patients receiving prescriptions (written and/or electronic).  Medication Rules & Regulations: You are responsible for reading, knowing, and following our Medication Rules document. These exist for your safety and that of others. They are not flexible and neither are we. Dismissing or ignoring them is an act of non-compliance that may result in complete and irreversible termination of such medication therapy. For safety reasons, non-compliance will not be tolerated. As with the U.S. fundamental legal principle of ignorance of the law is no defense, we will accept no excuses for not having read and knowing the content of documents provided to you by our practice.  Pharmacy of record:  Definition: This is the pharmacy where your electronic prescriptions will be sent.  We do not endorse any particular pharmacy. It is up to you and your insurance to decide what pharmacy to use.  We do not restrict you in your  choice of pharmacy. However, once we write for your prescriptions, we will NOT be re-sending more prescriptions to fix restricted supply problems created by your pharmacy, or your insurance.  The pharmacy listed in the electronic medical record should be the one where you want electronic prescriptions to be sent. If you choose to change pharmacy, simply notify our nursing staff. Changes will be made only during your regular appointments and not over the phone.  Recommendations: Keep all of your pain medications in a safe place, under lock and key, even if you live alone. We will NOT replace lost, stolen, or damaged medication. We do not accept Police Reports as proof of medications having been stolen. After you fill your prescription, take 1 week's worth of pills and put them away in a safe place. You should keep a separate, properly labeled bottle for this purpose. The remainder should be kept in the original bottle. Use this as your primary supply, until it runs out. Once it's gone, then you know that you have 1 week's worth of medicine, and it is time to come in for a prescription refill. If you do this correctly, it is unlikely that you will ever run out of medicine. To make sure that the above recommendation works, it is very important that you make sure your medication refill appointments are scheduled at least 1 week before you run out of medicine. To do this in an effective manner, make sure that you do not leave the office without scheduling your next medication management appointment. Always ask the nursing staff to show you in your prescription , when your medication will be running out. Then arrange for the receptionist to get you a return appointment, at least 7 days before you run out of medicine. Do not wait until you have 1 or 2 pills left, to come in. This is very poor planning and does not take into consideration that we may  need to cancel appointments due to bad weather, sickness, or  emergencies affecting our staff. DO NOT ACCEPT A Partial Fill: If for any reason your pharmacy does not have enough pills/tablets to completely fill or refill your prescription, do not allow for a partial fill. The law allows the pharmacy to complete that prescription within 72 hours, without requiring a new prescription. If they do not fill the rest of your prescription within those 72 hours, you will need a separate prescription to fill the remaining amount, which we will NOT provide. If the reason for the partial fill is your insurance, you will need to talk to the pharmacist about payment alternatives for the remaining tablets, but again, DO NOT ACCEPT A PARTIAL FILL, unless you can trust your pharmacist to obtain the remainder of the pills within 72 hours.  Prescription refills and/or changes in medication(s):  Prescription refills, and/or changes in dose or medication, will be conducted only during scheduled medication management appointments. (Applies to both, written and electronic prescriptions.) No refills on procedure days. No medication will be changed or started on procedure days. No changes, adjustments, and/or refills will be conducted on a procedure day. Doing so will interfere with the diagnostic portion of the procedure. No phone refills. No medications will be called into the pharmacy. No Fax refills. No weekend refills. No Holliday refills. No after hours refills.  Remember:  Business hours are:  Monday to Thursday 8:00 AM to 4:00 PM Provider's Schedule: Eric Como, MD - Appointments are:  Medication management: Monday and Wednesday 8:00 AM to 4:00 PM Procedure day: Tuesday and Thursday 7:30 AM to 4:00 PM Wallie Sherry, MD - Appointments are:  Medication management: Tuesday and Thursday 8:00 AM to 4:00 PM Procedure day: Monday and Wednesday 7:30 AM to 4:00 PM (Last update: 12/25/2021) ______________________________________________________________________      ______________________________________________________________________    Medication Rules  Purpose: To inform patients, and their family members, of our medication rules and regulations.  Applies to: All patients receiving prescriptions from our practice (written or electronic).  Pharmacy of record: This is the pharmacy where your electronic prescriptions will be sent. Make sure we have the correct one.  Electronic prescriptions: In compliance with the [place]  Strengthen Opioid Misuse Prevention (STOP) Act of 2017 (Session Law 2017-74/H243), effective March 04, 2018, all controlled substances must be electronically prescribed. Written prescriptions, faxing, or calling prescriptions to a pharmacy will no longer be done.  Prescription refills: These will be provided only during in-person appointments. No medications will be renewed without a face-to-face evaluation with your provider. Applies to all prescriptions.  NOTE: The following applies primarily to controlled substances (Opioid* Pain Medications).   Type of encounter (visit): For patients receiving controlled substances, face-to-face visits are required. (Not an option and not up to the patient.)  Patient's Responsibilities: Pain Pills: Bring all pain pills to every appointment (except for procedure appointments). Pill counts are required.  Pill Bottles: Bring pills in original pharmacy bottle. Bring bottle, even if empty. Always bring the bottle of the most recent fill.  Medication refills: You are responsible for knowing and keeping track of what medications you are taking and when is it that you will need a refill. The day before your appointment: write a list of all prescriptions that need to be refilled. The day of the appointment: give the list to the admitting nurse. Prescriptions will be written only during appointments. No prescriptions will be written on procedure days. If you forget a medication: it will  not be  Called in, Faxed, or electronically sent. You will need to get another appointment to get these prescribed. No early refills. Do not call asking to have your prescription filled early. Partial  or short prescriptions: Occasionally your pharmacy may not have enough pills to fill your prescription.  NEVER ACCEPT a partial fill or a prescription that is short of the total amount of pills that you were prescribed.  With controlled substances the law allows 72 hours for the pharmacy to complete the prescription.  If the prescription is not completed within 72 hours, the pharmacist will require a new prescription to be written. This means that you will be short on your medicine and we WILL NOT send another prescription to complete your original prescription.  Instead, request the pharmacy to send a carrier to a nearby branch to get enough medication to provide you with your full prescription. Prescription Accuracy: You are responsible for carefully inspecting your prescriptions before leaving our office. Have the discharge nurse carefully go over each prescription with you, before taking them home. Make sure that your name is accurately spelled, that your address is correct. Check the name and dose of your medication to make sure it is accurate. Check the number of pills, and the written instructions to make sure they are clear and accurate. Make sure that you are given enough medication to last until your next medication refill appointment. Taking Medication: Take medication as prescribed. When it comes to controlled substances, taking less pills or less frequently than prescribed is permitted and encouraged. Never take more pills than instructed. Never take the medication more frequently than prescribed.  Inform other Doctors: Always inform, all of your healthcare providers, of all the medications you take. Pain Medication from other Providers: You are not allowed to accept any additional pain medication  from any other Doctor or Healthcare provider. There are two exceptions to this rule. (see below) In the event that you require additional pain medication, you are responsible for notifying us , as stated below. Cough Medicine: Often these contain an opioid, such as codeine or hydrocodone. Never accept or take cough medicine containing these opioids if you are already taking an opioid* medication. The combination may cause respiratory failure and death. Medication Agreement: You are responsible for carefully reading and following our Medication Agreement. This must be signed before receiving any prescriptions from our practice. Safely store a copy of your signed Agreement. Violations to the Agreement will result in no further prescriptions. (Additional copies of our Medication Agreement are available upon request.) Laws, Rules, & Regulations: All patients are expected to follow all [address] and [employer], Rules, [employer]. Ignorance of the Laws does not constitute a valid excuse.  Illegal drugs and Controlled Substances: The use of illegal substances (including, but not limited to marijuana and its derivatives) and/or the illegal use of any controlled substances is strictly prohibited. Violation of this rule may result in the immediate and permanent discontinuation of any and all prescriptions being written by our practice. The use of any illegal substances is prohibited. Adopted CDC guidelines & recommendations: Target dosing levels will be at or below 60 MME/day. Use of benzodiazepines** is not recommended. Urine Drug testing: Patients taking controlled substances will be required to provide a urine sample upon request. Do not void before coming to your medication management appointments. Hold emptying your bladder until you are admitted. The admitting nurse will inform you if a sample is required. Our practice reserves the right to  call you at any time to provide a sample. Once receiving the  call, you have 24 hours to comply with request. Not providing a sample upon request may result in termination of medication therapy.  Exceptions: There are only two exceptions to the rule of not receiving pain medications from other Healthcare Providers. Exception #1 (Emergencies): In the event of an emergency (i.e.: accident requiring emergency care), you are allowed to receive additional pain medication. However, you are responsible for: As soon as you are able, call our office [phone], at any time of the day or night, and leave a message stating your name, the date and nature of the emergency, and the name and dose of the medication prescribed. In the event that your call is answered by a member of our staff, make sure to document and save the date, time, and the name of the person that took your information.  Exception #2 (Planned Surgery): In the event that you are scheduled by another doctor or dentist to have any type of surgery or procedure, you are allowed (for a period no longer than 30 days), to receive additional pain medication, for the acute post-op pain. However, in this case, you are responsible for picking up a copy of our Post-op Pain Management for Surgeons handout, and giving it to your surgeon or dentist. This document is available at our office, and does not require an appointment to obtain it. Simply go to our office during business hours (Monday-Thursday from 8:00 AM to 4:00 PM) (Friday 8:00 AM to 12:00 Noon) or if you have a scheduled appointment with us , prior to your surgery, and ask for it by name. In addition, you are responsible for: calling our office [phone], at any time of the day or night, and leaving a message stating your name, name of your surgeon, type of surgery, and date of procedure or surgery. Failure to comply with your responsibilities may result in termination of therapy involving the controlled substances.  Consequences:  Non-compliance with the  above rules may result in permanent discontinuation of medication prescription therapy. All patients receiving any type of controlled substance is expected to comply with the above patient responsibilities. Not doing so may result in permanent discontinuation of medication prescription therapy. Medication Agreement Violation. Following the above rules, including your responsibilities will help you in avoiding a Medication Agreement Violation ("Breaking your Pain Medication Contract").  *Opioid medications include: morphine , codeine, oxycodone , oxymorphone, hydrocodone, hydromorphone , meperidine, tramadol , tapentadol, buprenorphine, fentanyl , methadone. **Benzodiazepine medications include: diazepam (Valium), alprazolam (Xanax), clonazepam (Klonopine), lorazepam (Ativan), clorazepate (Tranxene), chlordiazepoxide (Librium), estazolam (Prosom), oxazepam (Serax), temazepam (Restoril), triazolam (Halcion) (Last updated: 12/25/2022) ______________________________________________________________________     ______________________________________________________________________    Medication Transfer   Notification You are currently compliant and stable on your pain medication regimen. This regimen will be transferred today to your Primary Care Provider (PCP). You will be provided with enough prescriptions to last for 90 days. After that, your prescriptions will need to be taken over by your PCP.  Recommendation Immediately contact your primary care provider to secure an appointment for evaluation before this period is over. Do not wait until the last month to contact them.   Clarification The transfer of your medication regimen does not mean that you are being discharged from our clinic. We will remain available to you for any consultation or interventional therapies you may need.   Alternative Should you decide not to continue taking these medication and would like assistance in permanently stopping  them, please let us  know so that we can design a slow tapering down of your regimen.  Reason Our primary responsibility to provide specialized interventional pain management therapies otherwise not available to the community. We have in the past assisted primary care providers with reviewing and adjusting pain medication management therapies, however, we have been transparent to all patients and referring providers that it is not our intention to permanently take over this type of therapy. Transfer of this portion of your care will assist us  in freeing time to assist others in need of our specialty services.   ______________________________________________________________________

## 2023-11-27 ENCOUNTER — Other Ambulatory Visit: Payer: Self-pay | Admitting: Family Medicine

## 2023-11-28 NOTE — Telephone Encounter (Signed)
 Requested Prescriptions  Pending Prescriptions Disp Refills   lidocaine  (LIDODERM ) 5 % [Pharmacy Med Name: LIDOCAINE  5% PATCH] 28 patch 5    Sig: UNWRAP AND APPLY 1 PATCH TOPICALLY ONTO THE SKIN ONCE DAILY. REMOVE AND DISCARD PATCH WITHIN 12 HOURS OR AS DIRECTED     Analgesics:  Topicals Failed - 11/28/2023 11:50 AM      Failed - Manual Review: Labs are only required if the patient has taken medication for more than 8 weeks.      Passed - PLT in normal range and within 360 days    Platelets  Date Value Ref Range Status  09/04/2023 210 150 - 450 x10E3/uL Final         Passed - HGB in normal range and within 360 days    Hemoglobin  Date Value Ref Range Status  09/04/2023 14.9 13.0 - 17.7 g/dL Final         Passed - HCT in normal range and within 360 days    Hematocrit  Date Value Ref Range Status  09/04/2023 45.8 37.5 - 51.0 % Final         Passed - Cr in normal range and within 360 days    Creatinine, Ser  Date Value Ref Range Status  09/04/2023 0.93 0.76 - 1.27 mg/dL Final         Passed - eGFR is 30 or above and within 360 days    GFR calc Af Amer  Date Value Ref Range Status  12/01/2019 >60 >60 mL/min Final   GFR, Estimated  Date Value Ref Range Status  07/05/2021 >60 >60 mL/min Final    Comment:    (NOTE) Calculated using the CKD-EPI Creatinine Equation (2021)    eGFR  Date Value Ref Range Status  09/04/2023 93 >59 mL/min/1.73 Final         Passed - Patient is not pregnant      Passed - Valid encounter within last 12 months    Recent Outpatient Visits           2 months ago Type 2 diabetes mellitus with stage 1 chronic kidney disease, without long-term current use of insulin  (HCC)   [facility] [place], [place], DO

## 2023-12-03 ENCOUNTER — Ambulatory Visit
Admission: RE | Admit: 2023-12-03 | Discharge: 2023-12-03 | Disposition: A | Discharge status: Home / Self Care | Payer: 59 | Source: Ambulatory Visit | Attending: Oncology | Admitting: Oncology

## 2023-12-03 DIAGNOSIS — I700 Atherosclerosis of aorta: Secondary | ICD-10-CM | POA: Diagnosis not present

## 2023-12-03 DIAGNOSIS — C3492 Malignant neoplasm of unspecified part of left bronchus or lung: Secondary | ICD-10-CM | POA: Diagnosis not present

## 2023-12-03 LAB — POCT I-STAT CREATININE: Creatinine, Ser: 0.8 mg/dL (ref 0.61–1.24)

## 2023-12-03 MED ORDER — IOHEXOL 300 MG/ML  SOLN
75.0000 mL | Freq: Once | INTRAMUSCULAR | Status: AC | PRN
  Administered 2023-12-03: 75 mL via INTRAVENOUS

## 2023-12-08 ENCOUNTER — Encounter: Payer: Self-pay | Admitting: Family Medicine

## 2023-12-08 ENCOUNTER — Ambulatory Visit: Admitting: Family Medicine

## 2023-12-08 VITALS — BP 113/80 | HR 94 | Temp 97.7°F | Ht 70.0 in | Wt 222.0 lb

## 2023-12-08 DIAGNOSIS — N181 Chronic kidney disease, stage 1: Secondary | ICD-10-CM

## 2023-12-08 DIAGNOSIS — E1122 Type 2 diabetes mellitus with diabetic chronic kidney disease: Secondary | ICD-10-CM | POA: Diagnosis not present

## 2023-12-08 DIAGNOSIS — Z7984 Long term (current) use of oral hypoglycemic drugs: Secondary | ICD-10-CM

## 2023-12-08 LAB — BAYER DCA HB A1C WAIVED: HB A1C (BAYER DCA - WAIVED): 6.5 % — ABNORMAL HIGH (ref 4.8–5.6)

## 2023-12-08 NOTE — Assessment & Plan Note (Signed)
 Doing great with A1c of 6.5 down from 6.7! Keep up the good work. Continue current regimen. Follow up 3 months.

## 2023-12-08 NOTE — Progress Notes (Signed)
 BP 113/80   Pulse 94   Temp 97.7 F (36.5 C) (Oral)   Ht 5' 10 (1.778 m)   Wt 222 lb (100.7 kg)   SpO2 95%   BMI 31.85 kg/m    Subjective:    Patient ID: Kristopher [name], male    DOB: 1961/01/02, 63 y.o.   MRN: [id number]  HPI: Kristopher [name] is a 63 y.o. male  Chief Complaint  Patient presents with   Diabetes   DIABETES Hypoglycemic episodes:no Polydipsia/polyuria: no Visual disturbance: no Chest pain: no Paresthesias: no Glucose Monitoring: yes  Accucheck frequency: Daily Taking Insulin ?: no Blood Pressure Monitoring: rarely Retinal Examination: Up to Date Foot Exam: Up to Date Diabetic Education: Completed Pneumovax: Not up to Date Influenza: Not up to Date Aspirin: no   Relevant past medical, surgical, family and social history reviewed and updated as indicated. Interim medical history since our last visit reviewed. Allergies and medications reviewed and updated.  Review of Systems  Constitutional: Negative.   Respiratory: Negative.    Cardiovascular: Negative.   Musculoskeletal: Negative.   Neurological: Negative.   Psychiatric/Behavioral: Negative.      Per HPI unless specifically indicated above     Objective:    BP 113/80   Pulse 94   Temp 97.7 F (36.5 C) (Oral)   Ht 5' 10 (1.778 m)   Wt 222 lb (100.7 kg)   SpO2 95%   BMI 31.85 kg/m   Wt Readings from Last 3 Encounters:  12/08/23 222 lb (100.7 kg)  11/19/23 210 lb (95.3 kg)  10/23/23 210 lb 1.6 oz (95.3 kg)    Physical Exam Vitals and nursing note reviewed.  Constitutional:      General: He is not in acute distress.    Appearance: Normal appearance. He is not ill-appearing, toxic-appearing or diaphoretic.  HENT:     Head: Normocephalic and atraumatic.     Right Ear: External ear normal.     Left Ear: External ear normal.     Nose: Nose normal.     Mouth/Throat:     Mouth: Mucous membranes are moist.     Pharynx: Oropharynx is clear.  Eyes:     General: No scleral  icterus.       Right eye: No discharge.        Left eye: No discharge.     Extraocular Movements: Extraocular movements intact.     Conjunctiva/sclera: Conjunctivae normal.     Pupils: Pupils are equal, round, and reactive to light.  Cardiovascular:     Rate and Rhythm: Normal rate and regular rhythm.     Pulses: Normal pulses.     Heart sounds: Normal heart sounds. No murmur heard.    No friction rub. No gallop.  Pulmonary:     Effort: Pulmonary effort is normal. No respiratory distress.     Breath sounds: Normal breath sounds. No stridor. No wheezing, rhonchi or rales.  Chest:     Chest wall: No tenderness.  Musculoskeletal:        General: Normal range of motion.     Cervical back: Normal range of motion and neck supple.  Skin:    General: Skin is warm and dry.     Capillary Refill: Capillary refill takes less than 2 seconds.     Coloration: Skin is not jaundiced or pale.     Findings: No bruising, erythema, lesion or rash.  Neurological:     General: No focal deficit present.  Mental Status: He is alert and oriented to person, place, and time. Mental status is at baseline.  Psychiatric:        Mood and Affect: Mood normal.        Behavior: Behavior normal.        Thought Content: Thought content normal.        Judgment: Judgment normal.     Results for orders placed or performed during the hospital encounter of 12/03/23  I-STAT creatinine   Collection Time: 12/03/23  9:52 AM  Result Value Ref Range   Creatinine, Ser 0.80 0.61 - 1.24 mg/dL      Assessment & Plan:   Problem List Items Addressed This Visit       Endocrine   Type 2 diabetes mellitus with stage 1 chronic kidney disease, without long-term current use of insulin  (HCC) - Primary   Doing great with A1c of 6.5 down from 6.7! Keep up the good work. Continue current regimen. Follow up 3 months.      Relevant Orders   Bayer DCA Hb A1c Waived     Follow up plan: Return in about 3 months (around  03/09/2024) for physical.

## 2023-12-10 ENCOUNTER — Other Ambulatory Visit: Payer: Self-pay | Admitting: Family Medicine

## 2023-12-10 ENCOUNTER — Encounter: Payer: Self-pay | Admitting: Oncology

## 2023-12-10 ENCOUNTER — Inpatient Hospital Stay: Payer: 59 | Attending: Oncology | Admitting: Oncology

## 2023-12-10 VITALS — BP 120/90 | HR 84 | Temp 98.3°F | Resp 18 | Ht 70.0 in | Wt 221.0 lb

## 2023-12-10 DIAGNOSIS — Z8249 Family history of ischemic heart disease and other diseases of the circulatory system: Secondary | ICD-10-CM | POA: Insufficient documentation

## 2023-12-10 DIAGNOSIS — Z880 Allergy status to penicillin: Secondary | ICD-10-CM | POA: Diagnosis not present

## 2023-12-10 DIAGNOSIS — Z886 Allergy status to analgesic agent status: Secondary | ICD-10-CM | POA: Insufficient documentation

## 2023-12-10 DIAGNOSIS — Z87891 Personal history of nicotine dependence: Secondary | ICD-10-CM | POA: Insufficient documentation

## 2023-12-10 DIAGNOSIS — E1122 Type 2 diabetes mellitus with diabetic chronic kidney disease: Secondary | ICD-10-CM | POA: Diagnosis not present

## 2023-12-10 DIAGNOSIS — C3492 Malignant neoplasm of unspecified part of left bronchus or lung: Secondary | ICD-10-CM | POA: Diagnosis not present

## 2023-12-10 DIAGNOSIS — Z809 Family history of malignant neoplasm, unspecified: Secondary | ICD-10-CM | POA: Insufficient documentation

## 2023-12-10 DIAGNOSIS — I129 Hypertensive chronic kidney disease with stage 1 through stage 4 chronic kidney disease, or unspecified chronic kidney disease: Secondary | ICD-10-CM | POA: Insufficient documentation

## 2023-12-10 DIAGNOSIS — N181 Chronic kidney disease, stage 1: Secondary | ICD-10-CM | POA: Diagnosis not present

## 2023-12-10 DIAGNOSIS — Z79899 Other long term (current) drug therapy: Secondary | ICD-10-CM | POA: Insufficient documentation

## 2023-12-10 DIAGNOSIS — Z9049 Acquired absence of other specified parts of digestive tract: Secondary | ICD-10-CM | POA: Diagnosis not present

## 2023-12-10 DIAGNOSIS — Z833 Family history of diabetes mellitus: Secondary | ICD-10-CM | POA: Insufficient documentation

## 2023-12-10 DIAGNOSIS — Z9221 Personal history of antineoplastic chemotherapy: Secondary | ICD-10-CM | POA: Insufficient documentation

## 2023-12-10 DIAGNOSIS — G8929 Other chronic pain: Secondary | ICD-10-CM | POA: Diagnosis not present

## 2023-12-10 DIAGNOSIS — M1990 Unspecified osteoarthritis, unspecified site: Secondary | ICD-10-CM | POA: Insufficient documentation

## 2023-12-10 DIAGNOSIS — C3432 Malignant neoplasm of lower lobe, left bronchus or lung: Secondary | ICD-10-CM | POA: Diagnosis present

## 2023-12-10 NOTE — Progress Notes (Signed)
 [facility]  Telephone:([phone]) [phone] Fax:(336) [phone]  ID: Kristopher [name] OB: September 02, 1960  MR#: [id number]  RDW#:[id number]  Patient Care Team: Vicci Duwaine SQUIBB, DO as PCP - General (Family Medicine) Verdene Gills, RN as Registered Nurse Tanya Glisson, MD as Referring Physician (Pain Medicine) Jacobo Evalene PARAS, MD as Consulting Physician (Oncology) Lenn Aran, MD as Consulting Physician (Radiation Oncology) Trevor Haskell, [place] (Optometry) Tobie Emmy POUR, NP as Nurse Practitioner (Pain Medicine) Isaiah Scrivener, MD as Consulting Physician (Pulmonary Disease)  CHIEF COMPLAINT: Clinical stage IIIa squamous cell 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  INTERVAL HISTORY: Patient returns to clinic today for routine yearly evaluation and discussion of his CT scan results.  He currently feels well and is asymptomatic.  He has a good appetite and denies weight loss.  He has no neurologic complaints.  He denies any recent fevers or illnesses.  He has no chest pain, shortness of breath, cough, or hemoptysis.  He denies any nausea, vomiting, constipation, or diarrhea.  He has no urinary complaints.  Patient offers no specific complaints today.  REVIEW OF SYSTEMS:   Review of Systems  Constitutional: Negative.  Negative for fever, malaise/fatigue and weight loss.  Respiratory: Negative.  Negative for cough and shortness of breath.   Cardiovascular: Negative.  Negative for chest pain and leg swelling.  Gastrointestinal: Negative.  Negative for abdominal pain and heartburn.  Genitourinary: Negative.  Negative for dysuria and flank pain.  Musculoskeletal: Negative.  Negative for back pain.  Skin: Negative.  Negative for rash.  Neurological: Negative.  Negative for dizziness, focal weakness, weakness and headaches.  Psychiatric/Behavioral: Negative.  The patient is not nervous/anxious.     As per HPI. Otherwise, a complete review of systems is negative.  PAST MEDICAL  HISTORY: Past Medical History:  Diagnosis Date   Allergy    Arthritis    left foot   Benign hypertensive kidney disease    Chronic back pain    Four rods in back   Diabetes mellitus, type 2 (HCC)    Dyspnea    GERD (gastroesophageal reflux disease)    Hypertension    Malignant neoplasm of lung (HCC)    Migraines    daily    PAST SURGICAL HISTORY: Past Surgical History:  Procedure Laterality Date   APPENDECTOMY     BACK SURGERY     COLONOSCOPY WITH PROPOFOL  N/A 02/19/2016   Procedure: COLONOSCOPY WITH PROPOFOL ;  Surgeon: Rogelia Copping, MD;  Location: [facility];  Service: Endoscopy;  Laterality: N/A;   COLONOSCOPY WITH PROPOFOL  N/A 01/19/2018   Procedure: COLONOSCOPY WITH PROPOFOL ;  Surgeon: Copping Rogelia, MD;  Location: [facility];  Service: Endoscopy;  Laterality: N/A;  Diabetic - oral meds   COLONOSCOPY WITH PROPOFOL  N/A 03/16/2021   Procedure: COLONOSCOPY WITH PROPOFOL ;  Surgeon: Copping Rogelia, MD;  Location: [facility];  Service: Endoscopy;  Laterality: N/A;  INADEQUATE PREP   COLONOSCOPY WITH PROPOFOL  N/A 04/06/2021   Procedure: COLONOSCOPY WITH PROPOFOL ;  Surgeon: Copping Rogelia, MD;  Location: [facility];  Service: Endoscopy;  Laterality: N/A;   DG OPERATIVE LEFT HIP ([facility] HX)     10/19   ELECTROMAGNETIC NAVIGATION BROCHOSCOPY Left 11/18/2018   Procedure: ELECTROMAGNETIC NAVIGATION BRONCHOSCOPY;  Surgeon: Tamea Dedra CROME, MD;  Location: [facility] ORS;  Service: Cardiopulmonary;  Laterality: Left;   FLEXIBLE BRONCHOSCOPY Bilateral 01/20/2019   Procedure: FLEXIBLE BRONCHOSCOPY;  Surgeon: Parris Manna, MD;  Location: [facility] ORS;  Service: Thoracic;  Laterality: Bilateral;   FLEXIBLE BRONCHOSCOPY  Bilateral 01/22/2019   Procedure: FLEXIBLE BRONCHOSCOPY;  Surgeon: Parris Manna, MD;  Location: [facility] ORS;  Service: Thoracic;  Laterality: Bilateral;   FOOT SURGERY Left    Screws and plates   JOINT REPLACEMENT Left 12/2017   DR Kathlynn Hip   KNEE  SURGERY Left    X 2   LEG SURGERY     LUNG CANCER SURGERY     POLYPECTOMY N/A 02/19/2016   Procedure: POLYPECTOMY;  Surgeon: Rogelia Copping, MD;  Location: [facility];  Service: Endoscopy;  Laterality: N/A;   POLYPECTOMY  01/19/2018   Procedure: POLYPECTOMY;  Surgeon: Copping Rogelia, MD;  Location: [facility];  Service: Endoscopy;;   PORTACATH PLACEMENT N/A 11/11/2019   Procedure: INSERTION PORT-A-CATH;  Surgeon: Volney Lye, MD;  Location: [facility] ORS;  Service: General;  Laterality: N/A;   THORACOTOMY Left 01/14/2019   Procedure: THORACOTOMY MAJOR, LEFT;  Surgeon: Volney Lye, MD;  Location: [facility] ORS;  Service: General;  Laterality: Left;   TOTAL HIP ARTHROPLASTY Left 12/02/2017   Procedure: TOTAL HIP ARTHROPLASTY ANTERIOR APPROACH;  Surgeon: Kathlynn Sharper, MD;  Location: [facility] ORS;  Service: Orthopedics;  Laterality: Left;   VIDEO BRONCHOSCOPY Left 01/14/2019   Procedure: VIDEO BRONCHOSCOPY WITH FLUORO, LEFT;  Surgeon: Volney Lye, MD;  Location: [facility] ORS;  Service: General;  Laterality: Left;   VIDEO BRONCHOSCOPY WITH ENDOBRONCHIAL NAVIGATION N/A 10/15/2019   Procedure: VIDEO BRONCHOSCOPY WITH ENDOBRONCHIAL NAVIGATION;  Surgeon: Parris Manna, MD;  Location: [facility] ORS;  Service: Thoracic;  Laterality: N/A;   VIDEO BRONCHOSCOPY WITH ENDOBRONCHIAL ULTRASOUND N/A 10/15/2019   Procedure: VIDEO BRONCHOSCOPY WITH ENDOBRONCHIAL ULTRASOUND;  Surgeon: Parris Manna, MD;  Location: [facility] ORS;  Service: Thoracic;  Laterality: N/A;    FAMILY HISTORY: Family History  Problem Relation Age of Onset   Heart disease Mother    Cancer Father    Diabetes Sister    Thrombosis Sister     ADVANCED DIRECTIVES (Y/N):  N  HEALTH MAINTENANCE: Social History   Tobacco Use   Smoking status: Former    Current packs/day: 0.00    Average packs/day: 0.3 packs/day for 35.0 years (8.8 ttl pk-yrs)    Types: Cigarettes    Start date: 01/14/1984    Quit date: 01/14/2019    Years since quitting:  4.9   Smokeless tobacco: Never  Vaping Use   Vaping status: Never Used  Substance Use Topics   Alcohol use: No    Alcohol/week: 0.0 standard drinks of alcohol   Drug use: Yes    Types: Morphine     Comment: as prescribed by pain management     Colonoscopy:  PAP:  Bone density:  Lipid panel:  Allergies  Allergen Reactions   Acetaminophen  Swelling   Aspirin Anaphylaxis   Epinephrine Anaphylaxis    Does not include albuterol    Novocain [Procaine] Anaphylaxis   Penicillins Anaphylaxis    Has patient had a PCN reaction causing immediate rash, facial/tongue/throat swelling, SOB or lightheadedness with hypotension: Yes Has patient had a PCN reaction causing severe rash involving mucus membranes or skin necrosis: No Has patient had a PCN reaction that required hospitalization: Yes Has patient had a PCN reaction occurring within the last 10 years: No If all of the above answers are NO, then may proceed with Cephalosporin use.    Strawberry Extract Anaphylaxis   Shellfish Allergy Hives and Nausea And Vomiting    Current Outpatient Medications  Medication Sig Dispense Refill   Accu-Chek FastClix Lancets MISC USE TO TEST BLOOD SUGAR 2X A DAY  100 each 12   ACCU-CHEK GUIDE TEST test strip USE TO TEST BLOOD SUGAR TWICE DAILY 100 strip 3   albuterol  (PROVENTIL ) (2.5 MG/3ML) 0.083% nebulizer solution USE 1 VIAL VIA NEBULIZER EVERY 6 HOURS AS NEEDED FOR WHEEZING OR SHORTNESS OF BREATH 150 mL 1   albuterol  (VENTOLIN  HFA) 108 (90 Base) MCG/ACT inhaler Inhale into the lungs.     amLODipine  (NORVASC ) 5 MG tablet TAKE 1 TABLET(5 MG) BY MOUTH TWICE DAILY 180 tablet 1   atorvastatin  (LIPITOR) 10 MG tablet Take 1 tablet (10 mg total) by mouth daily at 6 PM. 90 tablet 1   BREZTRI  AEROSPHERE 160-9-4.8 MCG/ACT AERO inhaler Inhale 2 puffs into the lungs 2 (two) times daily. Maintenance per pulmonology 5.9 g 6   diphenhydrAMINE  (BENADRYL ) 25 MG tablet Take 75 mg by mouth in the morning and at bedtime.      empagliflozin  (JARDIANCE ) 25 MG TABS tablet TAKE 1 TABLET(25 MG) BY MOUTH DAILY BEFORE BREAKFAST 90 tablet 1   esomeprazole  (NEXIUM ) 40 MG capsule TAKE 1 CAPSULE(40 MG) BY MOUTH DAILY 90 capsule 3   hydrALAZINE  (APRESOLINE ) 100 MG tablet Take 1 tablet (100 mg total) by mouth 2 (two) times daily. 180 tablet 1   lidocaine  (LIDODERM ) 5 % UNWRAP AND APPLY 1 PATCH TOPICALLY ONTO THE SKIN ONCE DAILY. REMOVE AND DISCARD PATCH WITHIN 12 HOURS OR AS DIRECTED 28 patch 5   metFORMIN  (GLUCOPHAGE ) 500 MG tablet TAKE 2 TABLETS(1000 MG) BY MOUTH TWICE DAILY WITH A MEAL 360 tablet 1   metoprolol  succinate (TOPROL -XL) 100 MG 24 hr tablet Take 1 tablet (100 mg total) by mouth 2 (two) times daily. Take with or immediately following a meal. To be taken with 50mg  for 150mg  BID 180 tablet 1   metoprolol  tartrate (LOPRESSOR ) 50 MG tablet TAKE 1 TABLET BY MOUTH TWICE DAILY. TO BE TAKEN WITH 100 MG METOPROLOL  180 tablet 1   montelukast  (SINGULAIR ) 10 MG tablet TAKE 1 TABLET(10 MG) BY MOUTH DAILY 90 tablet 1   morphine  (MSIR) 15 MG tablet Take 1 tablet (15 mg total) by mouth every 6 (six) hours as needed for moderate pain (pain score 4-6) or severe pain (pain score 7-10). Must last 30 days. 120 tablet 0   [START ON 12/22/2023] morphine  (MSIR) 15 MG tablet Take 1 tablet (15 mg total) by mouth every 6 (six) hours as needed for moderate pain (pain score 4-6) or severe pain (pain score 7-10). Must last 30 days. 120 tablet 0   [START ON 01/21/2024] morphine  (MSIR) 15 MG tablet Take 1 tablet (15 mg total) by mouth every 6 (six) hours as needed for moderate pain (pain score 4-6) or severe pain (pain score 7-10). Must last 30 days. 120 tablet 0   Multiple Vitamin (MULTIVITAMIN WITH MINERALS) TABS tablet Take 1 tablet by mouth daily.     naloxone  (NARCAN ) nasal spray 4 mg/0.1 mL Place 1 spray into the nose as needed for up to 365 doses (for opioid-induced respiratory depresssion). In case of emergency (overdose), spray once into each  nostril. If no response within 3 minutes, repeat application and call 911. 1 each 0   nortriptyline  (PAMELOR ) 25 MG capsule TAKE 2 CAPSULES(50 MG) BY MOUTH AT BEDTIME 180 capsule 1   Potassium Chloride  ER 20 MEQ TBCR TAKE 1 TABLET(20 MEQ) BY MOUTH DAILY 90 tablet 1   Semaglutide  (RYBELSUS ) 14 MG TABS Take 1 tablet (14 mg total) by mouth daily. 90 tablet 1   STIOLTO RESPIMAT  2.5-2.5 MCG/ACT AERS Inhale 2 puffs  into the lungs 4 (four) times daily as needed. Prn per pulmonology notes 4 g 12   SUMAtriptan  (IMITREX ) 50 MG tablet Take 1 tablet (50 mg total) by mouth every 2 (two) hours as needed for migraine. Take 1 tab at onset of migraine. May repeat in 2 hours if headache persists or recurs. 10 tablet 12   No current facility-administered medications for this visit.   Facility-Administered Medications Ordered in Other Visits  Medication Dose Route Frequency Provider Last Rate Last Admin   sodium chloride  flush (NS) 0.9 % injection 10 mL  10 mL Intracatheter PRN Jacobo Evalene PARAS, MD   10 mL at 03/01/20 0953    OBJECTIVE: Vitals:   12/10/23 1020  BP: (!) 120/90  Pulse: 84  Resp: 18  Temp: 98.3 F (36.8 C)  SpO2: 96%      Body mass index is 31.71 kg/m.    ECOG FS:0 - Asymptomatic  General: Well-developed, well-nourished, no acute distress. Eyes: Pink conjunctiva, anicteric sclera. HEENT: Normocephalic, moist mucous membranes. Lungs: No audible wheezing or coughing. Heart: Regular rate and rhythm. Abdomen: Soft, nontender, no obvious distention. Musculoskeletal: No edema, cyanosis, or clubbing. Neuro: Alert, answering all questions appropriately. Cranial nerves grossly intact. Skin: No rashes or petechiae noted. Psych: Normal affect.  LAB RESULTS:  Lab Results  Component Value Date   NA 142 09/04/2023   K 4.1 09/04/2023   CL 101 09/04/2023   CO2 21 09/04/2023   GLUCOSE 164 (H) 09/04/2023   BUN 14 09/04/2023   CREATININE 0.80 12/03/2023   CALCIUM  9.6 09/04/2023   PROT 7.2  09/04/2023   ALBUMIN 4.5 09/04/2023   AST 27 09/04/2023   ALT 24 09/04/2023   ALKPHOS 144 (H) 09/04/2023   BILITOT 0.3 09/04/2023   GFRNONAA >60 07/05/2021   GFRAA >60 12/01/2019    Lab Results  Component Value Date   WBC 7.2 09/04/2023   NEUTROABS 4.5 09/04/2023   HGB 14.9 09/04/2023   HCT 45.8 09/04/2023   MCV 92 09/04/2023   PLT 210 09/04/2023     STUDIES:   ONCOLOGY HISTORY: Patient underwent lung resection on January 14, 2019 and had a complicated postoperative course. CT scan results from August 14, 2019 reviewed independently with a suspicious subcarinal lymph node measuring 1.4 cm.  Follow-up PET scan on August 23, 2019 revealed hypermetabolism in lymph node highly suspicious for recurrence.  Biopsy confirmed recurrence increasing patient's stage from IA up to IIIA. Patient completed concurrent XRT and weekly carboplatinum and Taxol  on January 05, 2020.  Patient completed 1 year of maintenance durvalumab  on January 17, 2021.  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  PLAN:    Clinical stage IIIa squamous cell carcinoma of the left lower lobe lung: Patient's most recent CT scan on December 03, 2023 reviewed independently with no obvious evidence of recurrent or progressive disease.  No intervention is needed.  Continue yearly imaging and evaluation through November 2027.  Return to clinic in 1 year with repeat CT scan and further evaluation.    Pain: Patient does not complain of this today.  Unrelated to history of malignancy.  Continue monitoring and treatment per pain clinic.   I spent a total of 20 minutes reviewing chart data, face-to-face evaluation with the patient, counseling and coordination of care as detailed above.   Patient expressed understanding and was in agreement with this plan. He also understands that He can call clinic at any time with any questions,  concerns, or complaints.    Cancer Staging  Squamous cell  carcinoma of left lung [facility]) Staging form: Lung, AJCC 8th Edition - Clinical stage from 02/12/2019: Stage IIIA (cT1b, cN2, cM0) - Signed by Jacobo Evalene PARAS, MD on 11/01/2019   Evalene PARAS Jacobo, MD   12/10/2023 10:30 AM

## 2023-12-10 NOTE — Progress Notes (Signed)
 Patient had a CT scan on 12/03/2023. He is doing pretty good.

## 2023-12-11 NOTE — Telephone Encounter (Signed)
 Too soon for refill, LRF 09/04/23 FOR 90 AND 1 RF.  Requested Prescriptions  Pending Prescriptions Disp Refills   JARDIANCE  25 MG TABS tablet [Pharmacy Med Name: JARDIANCE  25MG  TABLETS] 90 tablet 1    Sig: TAKE 1 TABLET(25 MG) BY MOUTH DAILY BEFORE BREAKFAST     Endocrinology:  Diabetes - SGLT2 Inhibitors Passed - 12/11/2023  4:22 PM      Passed - Cr in normal range and within 360 days    Creatinine, Ser  Date Value Ref Range Status  12/03/2023 0.80 0.61 - 1.24 mg/dL Final         Passed - HBA1C is between 0 and 7.9 and within 180 days    HB A1C (BAYER DCA - WAIVED)  Date Value Ref Range Status  12/08/2023 6.5 (H) 4.8 - 5.6 % Final    Comment:             Prediabetes: 5.7 - 6.4          Diabetes: >6.4          Glycemic control for adults with diabetes: <7.0          Passed - eGFR in normal range and within 360 days    GFR calc Af Amer  Date Value Ref Range Status  12/01/2019 >60 >60 mL/min Final   GFR, Estimated  Date Value Ref Range Status  07/05/2021 >60 >60 mL/min Final    Comment:    (NOTE) Calculated using the CKD-EPI Creatinine Equation (2021)    eGFR  Date Value Ref Range Status  09/04/2023 93 >59 mL/min/1.73 Final         Passed - Valid encounter within last 6 months    Recent Outpatient Visits           3 days ago Type 2 diabetes mellitus with stage 1 chronic kidney disease, without long-term current use of insulin  (HCC)   [place] [facility] [place], [name], DO   3 months ago Type 2 diabetes mellitus with stage 1 chronic kidney disease, without long-term current use of insulin  [facility])   [place] [facility] [place], Maynard, DO

## 2024-01-02 ENCOUNTER — Other Ambulatory Visit: Payer: Self-pay | Admitting: Family Medicine

## 2024-01-03 NOTE — Telephone Encounter (Signed)
 Requested medication (s) are due for refill today: Yes  Requested medication (s) are on the active medication list: Yes  Last refill:  08/06/23  Future visit scheduled: Yes  Notes to clinic:  Unable to refill due to no refill protocol for this medication.      Requested Prescriptions  Pending Prescriptions Disp Refills   ACCU-CHEK GUIDE TEST test strip [Pharmacy Med Name: ACCU-CHEK GUIDE TEST STRIPS 100S] 100 strip 3    Sig: USE TO TEST BLOOD SUGAR TWICE DAILY     There is no refill protocol information for this order

## 2024-01-22 ENCOUNTER — Other Ambulatory Visit: Payer: Self-pay | Admitting: Family Medicine

## 2024-01-23 NOTE — Telephone Encounter (Signed)
 Requested Prescriptions  Refused Prescriptions Disp Refills   metoprolol  succinate (TOPROL -XL) 100 MG 24 hr tablet [Pharmacy Med Name: METOPROLOL  ER SUCCINATE 100MG  TABS] 180 tablet 1    Sig: TAKE 1 TABLET BY MOUTH TWICE DAILY. TAKE WITH OR IMMEDIATELY FOLLOWING A MEAL. TO BE TAKEN WITH 50 MG FOR 150 MG TWICE DAILY     Cardiovascular:  Beta Blockers Failed - 01/23/2024  5:07 PM      Failed - Last BP in normal range    BP Readings from Last 1 Encounters:  12/10/23 (!) 120/90         Passed - Last Heart Rate in normal range    Pulse Readings from Last 1 Encounters:  12/10/23 84         Passed - Valid encounter within last 6 months    Recent Outpatient Visits           1 month ago Type 2 diabetes mellitus with stage 1 chronic kidney disease, without long-term current use of insulin  (HCC)   [place] [facility] [place], [name], DO   4 months ago Type 2 diabetes mellitus with stage 1 chronic kidney disease, without long-term current use of insulin  [facility])   [place] [facility] [place], [place], DO

## 2024-02-18 ENCOUNTER — Encounter: Payer: Self-pay | Admitting: Nurse Practitioner

## 2024-02-18 ENCOUNTER — Ambulatory Visit: Admitting: Nurse Practitioner

## 2024-02-18 VITALS — BP 122/91 | HR 70 | Temp 97.2°F | Resp 18 | Ht 70.0 in | Wt 210.0 lb

## 2024-02-18 DIAGNOSIS — M5441 Lumbago with sciatica, right side: Secondary | ICD-10-CM | POA: Insufficient documentation

## 2024-02-18 DIAGNOSIS — C3492 Malignant neoplasm of unspecified part of left bronchus or lung: Secondary | ICD-10-CM | POA: Insufficient documentation

## 2024-02-18 DIAGNOSIS — M79601 Pain in right arm: Secondary | ICD-10-CM | POA: Diagnosis present

## 2024-02-18 DIAGNOSIS — M5442 Lumbago with sciatica, left side: Secondary | ICD-10-CM | POA: Insufficient documentation

## 2024-02-18 DIAGNOSIS — Z79891 Long term (current) use of opiate analgesic: Secondary | ICD-10-CM | POA: Insufficient documentation

## 2024-02-18 DIAGNOSIS — M79605 Pain in left leg: Secondary | ICD-10-CM | POA: Diagnosis present

## 2024-02-18 DIAGNOSIS — G894 Chronic pain syndrome: Secondary | ICD-10-CM | POA: Insufficient documentation

## 2024-02-18 DIAGNOSIS — Z79899 Other long term (current) drug therapy: Secondary | ICD-10-CM | POA: Diagnosis present

## 2024-02-18 DIAGNOSIS — G8929 Other chronic pain: Secondary | ICD-10-CM | POA: Diagnosis present

## 2024-02-18 DIAGNOSIS — M79602 Pain in left arm: Secondary | ICD-10-CM | POA: Insufficient documentation

## 2024-02-18 DIAGNOSIS — M961 Postlaminectomy syndrome, not elsewhere classified: Secondary | ICD-10-CM | POA: Insufficient documentation

## 2024-02-18 MED ORDER — MORPHINE SULFATE 15 MG PO TABS: 15.0000 mg | ORAL_TABLET | Freq: Four times a day (QID) | ORAL | 0 refills | Status: AC | PRN

## 2024-02-18 NOTE — Patient Instructions (Signed)
 ______________________________________________________________________    Opioid Pain Medication Update  To: All patients taking opioid pain medications. (I.e.: hydrocodone , hydromorphone, oxycodone , oxymorphone, morphine, codeine, methadone, tapentadol, tramadol, buprenorphine, fentanyl , etc.)  Re: Updated review of side effects and adverse reactions of opioid analgesics, as well as new information about long term effects of this class of medications.  Direct risks of long-term opioid therapy are not limited to opioid addiction and overdose. Potential medical risks include serious fractures, breathing problems during sleep, hyperalgesia, immunosuppression, chronic constipation, bowel obstruction, myocardial infarction, and tooth decay secondary to xerostomia.  Unpredictable adverse effects that can occur even if you take your medication correctly: Cognitive impairment, respiratory depression, and death. Most people think that if they take their medication correctly, and as instructed, that they will be safe. Nothing could be farther from the truth. In reality, a significant amount of recorded deaths associated with the use of opioids has occurred in individuals that had taken the medication for a long time, and were taking their medication correctly. The following are examples of how this can happen: Patient taking his/her medication for a long time, as instructed, without any side effects, is given a certain antibiotic or another unrelated medication, which in turn triggers a Drug-to-drug interaction leading to disorientation, cognitive impairment, impaired reflexes, respiratory depression or an untoward event leading to serious bodily harm or injury, including death.  Patient taking his/her medication for a long time, as instructed, without any side effects, develops an acute impairment of liver and/or kidney function. This will lead to a rapid inability of the body to breakdown and eliminate  their pain medication, which will result in effects similar to an overdose, but with the same medicine and dose that they had always taken. This again may lead to disorientation, cognitive impairment, impaired reflexes, respiratory depression or an untoward event leading to serious bodily harm or injury, including death.  A similar problem will occur with patients as they grow older and their liver and kidney function begins to decrease as part of the aging process.  Background information: Historically, the original case for using long-term opioid therapy to treat chronic noncancer pain was based on safety assumptions that subsequent experience has called into question. In 1996, the American Pain Society and the American Academy of Pain Medicine issued a consensus statement supporting long-term opioid therapy. This statement acknowledged the dangers of opioid prescribing but concluded that the risk for addiction was low; respiratory depression induced by opioids was short-lived, occurred mainly in opioid-naive patients, and was antagonized by pain; tolerance was not a common problem; and efforts to control diversion should not constrain opioid prescribing. This has now proven to be wrong. Experience regarding the risks for opioid addiction, misuse, and overdose in community practice has failed to support these assumptions.  According to the Centers for Disease Control and Prevention, fatal overdoses involving opioid analgesics have increased sharply over the past decade. Currently, more than 96,700 people die from drug overdoses every year. Opioids are a factor in 7 out of every 10 overdose deaths. Deaths from drug overdose have surpassed motor vehicle accidents as the leading cause of death for individuals between the ages of 69 and 14.  Clinical data suggest that neuroendocrine dysfunction may be very common in both men and women, potentially causing hypogonadism, erectile dysfunction, infertility,  decreased libido, osteoporosis, and depression. Recent studies linked higher opioid dose to increased opioid-related mortality. Controlled observational studies reported that long-term opioid therapy may be associated with increased risk for cardiovascular events. Subsequent  meta-analysis concluded that the safety of long-term opioid therapy in elderly patients has not been proven.   Side Effects and adverse reactions: Common side effects: Drowsiness (sedation). Dizziness. Nausea and vomiting. Constipation. Physical dependence -- Dependence often manifests with withdrawal symptoms when opioids are discontinued or decreased. Tolerance -- As you take repeated doses of opioids, you require increased medication to experience the same effect of pain relief. Respiratory depression -- This can occur in healthy people, especially with higher doses. However, people with COPD, asthma or other lung conditions may be even more susceptible to fatal respiratory impairment.  Uncommon side effects: An increased sensitivity to feeling pain and extreme response to pain (hyperalgesia). Chronic use of opioids can lead to this. Delayed gastric emptying (the process by which the contents of your stomach are moved into your small intestine). Muscle rigidity. Immune system and hormonal dysfunction. Quick, involuntary muscle jerks (myoclonus). Arrhythmia. Itchy skin (pruritus). Dry mouth (xerostomia).  Long-term side effects: Chronic constipation. Sleep-disordered breathing (SDB). Increased risk of bone fractures. Hypothalamic-pituitary-adrenal dysregulation. Increased risk of overdose.  RISKS: Respiratory depression and death: Opioids increase the risk of respiratory depression and death.  Drug-to-drug interactions: Opioids are relatively contraindicated in combination with benzodiazepines, sleep inducers, and other central nervous system depressants. Other classes of medications (i.e.: certain antibiotics  and even over-the-counter medications) may also trigger or induce respiratory depression in some patients.  Medical conditions: Patients with pre-existing respiratory problems are at higher risk of respiratory failure and/or depression when in combination with opioid analgesics. Opioids are relatively contraindicated in some medical conditions such as central sleep apnea.   Fractures and Falls:  Opioids increase the risk and incidence of falls. This is of particular importance in elderly patients.  Endocrine System:  Long-term administration is associated with endocrine abnormalities (endocrinopathies). (Also known as Opioid-induced Endocrinopathy) Influences on both the hypothalamic-pituitary-adrenal axis?and the hypothalamic-pituitary-gonadal axis have been demonstrated with consequent hypogonadism and adrenal insufficiency in both sexes. Hypogonadism and decreased levels of dehydroepiandrosterone sulfate have been reported in men and women. Endocrine effects include: Amenorrhoea in women (abnormal absence of menstruation) Reduced libido in both sexes Decreased sexual function Erectile dysfunction in men Hypogonadisms (decreased testicular function with shrinkage of testicles) Infertility Depression and fatigue Loss of muscle mass Anxiety Depression Immune suppression Hyperalgesia Weight gain Anemia Osteoporosis Patients (particularly women of childbearing age) should avoid opioids. There is insufficient evidence to recommend routine monitoring of asymptomatic patients taking opioids in the long-term for hormonal deficiencies.  Immune System: Human studies have demonstrated that opioids have an immunomodulating effect. These effects are mediated via opioid receptors both on immune effector cells and in the central nervous system. Opioids have been demonstrated to have adverse effects on antimicrobial response and anti-tumour surveillance. Buprenorphine has been demonstrated to have  no impact on immune function.  Opioid Induced Hyperalgesia: Human studies have demonstrated that prolonged use of opioids can lead to a state of abnormal pain sensitivity, sometimes called opioid induced hyperalgesia (OIH). Opioid induced hyperalgesia is not usually seen in the absence of tolerance to opioid analgesia. Clinically, hyperalgesia may be diagnosed if the patient on long-term opioid therapy presents with increased pain. This might be qualitatively and anatomically distinct from pain related to disease progression or to breakthrough pain resulting from development of opioid tolerance. Pain associated with hyperalgesia tends to be more diffuse than the pre-existing pain and less defined in quality. Management of opioid induced hyperalgesia requires opioid dose reduction.  Cancer: Chronic opioid therapy has been associated with an increased risk  of cancer among noncancer patients with chronic pain. This association was more evident in chronic strong opioid users. Chronic opioid consumption causes significant pathological changes in the small intestine and colon. Epidemiological studies have found that there is a link between opium dependence and initiation of gastrointestinal cancers. Cancer is the second leading cause of death after cardiovascular disease. Chronic use of opioids can cause multiple conditions such as GERD, immunosuppression and renal damage as well as carcinogenic effects, which are associated with the incidence of cancers.   Mortality: Long-term opioid use has been associated with increased mortality among patients with chronic non-cancer pain (CNCP).  Prescription of long-acting opioids for chronic noncancer pain was associated with a significantly increased risk of all-cause mortality, including deaths from causes other than overdose.  Reference: Von Korff M, Kolodny A, Deyo RA, Chou R. Long-term opioid therapy reconsidered. Ann Intern Med. 2011 Sep 6;155(5):325-8. doi:  10.7326/[id number]. PMID: 78106373; PMCID: [id number]. Kit JINNY Laurence CINDERELLA Pearley JINNY, Hayward RA, Dunn KM, Jordan KP. Risk of adverse events in patients prescribed long-term opioids: A cohort study in the [place] Clinical Practice Research [employer]. Eur J Pain. 2019 May;23(5):908-922. doi: 10.1002/ejp.1357. Epub 2019 Jan 31. PMID: 69379883. Colameco S, Coren JS, Ciervo CA. Continuous opioid treatment for chronic noncancer pain: a time for moderation in prescribing. Postgrad Med. 2009 Jul;121(4):61-6. doi: 10.3810/pgm.[id number]. PMID: 80358728. Gigi JONELLE Shlomo MILUS Levern IVER Conny RN, [place], Blazina I, Lonell DASEN, Bougatsos C, Deyo RA. The effectiveness and risks of long-term opioid therapy for chronic pain: a systematic review for a [employer] of [employer] Pathways to [employer]. Ann Intern Med. 2015 Feb 17;162(4):276-86. doi: 10.7326/M14-2559. PMID: 74418742. Rory CHRISTELLA Laurence [facility], [place] DM. NCHS Data Brief No. 22. [place]: Centers for Disease Control and Prevention; 2009. Sep, Increase in Fatal Poisonings Involving Opioid Analgesics in the [place] , 1999-2006. Song IA, Choi HR, Oh TK. Long-term opioid use and mortality in patients with chronic non-cancer pain: Ten-year follow-up study in [place] from 2010 through 2019. EClinicalMedicine. 2022 Jul 18;51:101558. doi: 10.1016/j.eclinm.[id number]. PMID: 64124182; PMCID: [id number]. Huser, W., Schubert, T., Vogelmann, T. et al. All-cause mortality in patients with long-term opioid therapy compared with non-opioid analgesics for chronic non-cancer pain: a database study. BMC Med 18, 162 (2020). [email or web address].info/ Rashidian H, Zendehdel K, Kamangar F, Malekzadeh R, Haghdoost AA. An Ecological Study of the Association between Opiate Use and Incidence of Cancers. Addict Health. 2016 Fall;8(4):252-260. PMID: 71180443; PMCID: [id number].  Our Goal: Our goal is to control your pain with means other  than the use of opioid pain medications.  Our Recommendation: Talk to your physician about coming off of these medications. We can assist you with the tapering down and stopping these medicines. Based on the new information, even if you cannot completely stop the medication, a decrease in the dose may be associated with a lesser risk. Ask for other means of controlling the pain. Decrease or eliminate those factors that significantly contribute to your pain such as smoking, obesity, and a diet heavily tilted towards inflammatory nutrients.  Last Updated: 09/09/2022   ______________________________________________________________________       ______________________________________________________________________    Update on Controlled Substance (Opioid) Regulations   To: All patients taking opioid pain medications. (I.e.: hydrocodone , hydromorphone, oxycodone , oxymorphone, morphine, codeine, methadone, tapentadol, tramadol, buprenorphine, fentanyl , etc.)  Re: Review on the state of controlled substance regulations.  Introduction: Rules and regulations associated with all aspects of controlled substances are constantly being modified. Unfortunately we have encountered patients questioning the veracity of the information  that we provide them about these changes. This is intended to provide them with appropriate references and a historical review of these changes.  A Brief History: As of December 18, 2015, the [place]  Government declared the opioid epidemic a public health emergency. Prescription drug monitoring programs (PDMPs) and the [facility] All Schedules Prescription Electronic Reporting Act (NASPER). Before 1800, clinicians regarded pain as an existential phenomenon, a consequence of aging. There was no regulation on the use of cocaine and opioids, resulting in widespread marketing and prescribing for many ailments ranging from diarrhea to toothache. The [employer] of  [phone], passed in response to the sudden emergence of street heroin abuse as well as iatrogenic morphine dependence, influenced both physician and patient alike to avoid opiates. Patients with unexplained pain in the 1920s were regarded as deluded, malingering, or abusers, and cancer patients through the 1950s were encouraged to wean themselves off opioids until their lives could be measured in weeks. Alongside this opioid evolution, the American Pain Society launched their influential pain as the fifth vital sign campaign in 1995. Concurrently, pharmaceutical companies introduced new formulations, such as extended release oxycodone  (OxyContin ). From 1997 to 2002, OxyContin  prescriptions increased from 670,000 to 6.2 million. However, concerns soon began to surface regarding overzealous opioid treatment. It must be noted that pharmaceutical companies contributed significantly to the rise of the opioid epidemic, receiving considerable reprimands as a consequence. In 2007, as the opioid epidemic began to inflict profound damage, [employer] pleaded guilty to federal charges related to the misbranding of OxyContin . [employer] agreed to pay a total of $634.5 million to resolve Justice Department investigations, as well as a $19.5 million settlement to [address] and the [address] of [place].  In response to the current epidemic, changes in focus to the development of new abuse deterrent opioid formulations at the [place]  Food and Drug Administration (FDA) as well as drafting of new public standards for pain treatment were created at [facility] in 2017. In response to the opioid epidemic, FDA public policy changes were announced in February 2016. Among these new positions were a re-examination of the risk-benefit paradigm for opioids with strict emphasis on the large public health ramifications. The various modified opioids released over the past 20 years, such as tamper-resistant preparation, have had differing levels of success,  and are collectively referred to as Risk Evaluation and Mitigation Strategies (REMS). There is also a growing focus on preventing opioid use disorder (OUD) and on offering affected individuals accessible and effective treatment. [place]  government policy reflects these changes and both the Affordable Care Act and the Mental Health Parity and Addiction Equity Act were major steps forward in treating opioid addiction. The Affordable Care Act, which was signed into law in 2010, with major provisions coming into effect by 2014.  In the 1990s, the intensified marketing of newly reformulated prescription opioid medications (e.g., OxyContin ) and an influential pain advocacy campaign that encouraged greater pain management led to a precipitous rise in opioid use in the [place] . Research from the Centers for Disease Control and Prevention (CDC) shows that prescription opioid sales in the [place]  quadrupled from 1999 to 2010. At the same time, opioid misuse and opioid-involved overdose deaths increased (Figure 1). Between 1999 and 2010, the rate of opioidinvolved overdose deaths in the [place]  doubled from 2.9 to 6.8 deaths per 100,000 people. This initial rise in opioid-related deaths is often referred to as the first wave of the recent opioid crisis.  Between 1999 and 2020, 565,000  Americans died of opioid-involved overdoses. In turn, federal, state, and local governments responded with various legal and policy efforts to curb opioid misuse and drug-related overdose Deaths.  Recent Congresses have enacted several laws addressing the opioid crisis, such as the Comprehensive Addiction and Recovery Act of 2016 (CARA, P.L. 114-198); the 21st Century Cures Act (P.L. 114-255); the Substance UseDisorder Prevention that Promotes Opioid Recovery and Treatment for Patients and Communities Act (SUPPORT Act, P.L. [phone]); the Fentanyl  Sanctions Act (Title LXXII of P.L. [id number]); and the Blocking  Deadly Fentanyl  Imports Act (P.L. 117-81, 6610). These laws addressed overprescribing and misuse of opioids, expanded substance use disorder prevention and treatment capacities, bolstered drug diversion capabilities, and enhanced international drug interdiction, counternarcotics cooperation, and sanctions efforts. Congress also directed additional funds to many of these initiatives through appropriations.  Congress provided funding in the [employer] Act of 2021 [phone]; P.L. 117-2) for syringe services programs (often known as needle exchange programs) and other harm reduction initiatives. Federal and state harm reduction strategies have frequently involved the distribution of naloxone  (e.g., Narcan )--a medication used to reverse an opioid overdose--and test strips used to detect fentanyl  in drug samples.  The Department of Justice (DOJ) and Department of Homeland Security [facility]) aim to reduce the diversion of prescription opioids and the use, manufacturing, and trafficking of illicit opioids. DOJ--via the Drug Enforcement Administration (DEA)--regulates opioid manufacturers, distributors, and dispensers; it also controls the opioid supply through enforcement of regulatory requirements.  A History of Opiate Laws in the [place]   Prior to 1890, laws concerning opiates were strictly imposed on a local city or state-by-state basis. One of the first was in [place] in 1875 where it became illegal to smoke opium only in opium dens. It did not ban the sale, import or use otherwise. In the next 25 years different states enacted opium laws ranging from outlawing opium dens altogether to making possession of opium, morphine and heroin without a physicians prescription illegal.  The first Congressional Act took place in 1890 that levied taxes on morphine and opium. From that time on the [employer] has had a series of laws and acts directly aimed at opiate use, abuse  and control. These are outlined below:  1906 - Pure Food and Drug Act Preventing the manufacture, sale, or transportation of adulterated or misbranded or poisonous or deleterious foods, drugs, medicines, and liquors, and for regulating traffic therein, and for other purposes. Punishment included fines and prison time.  1909   - Smoking Opium Exclusion Act Banned the importation, possession and use of smoking opium. Did not regulate opium-based medications. First Freight forwarder banning the non-medical use of a substance.  1914  - The Margrette Act In summary, The Margrette Act of 1914 was written more to have all parties involved in importing, exporting, set designer and distributing opium or cocaine to register with the [employer] and have taxes levied upon them. Exempt from the law were physicians operating in the course of his professional practice  1919 - Supreme Court ratified the Bj's in [place]., v. [place]  and [place]  v. [place], then again in [facility] v. [place] , in 1920, holding that doctors may not prescribe maintenance supplies of narcotics to people addicted to narcotics. However, it does not prohibit doctors from prescribing narcotics to wean a patient off of the drug. It was also the opinion of the court that prescribing narcotics to habitual users was not considered professional practice hence it  then was considered illegal for doctors to prescribe opioids for the purposes of maintaining an addiction. It can be argued that todays addiction medications are not intended to maintain an addiction but to facilitate addiction remission. In which case, this opinion of the court should not preclude practitioners from prescribing buprenorphine or methadone to patients suffering from an addictive disorder.  1924  - Heroin Act Architectural technologist, importation and possession of heroin illegal - even for medicinal use.  1922 -- Narcotic  Drug Import and Export Act Enacted to assure proper control of importation, sale, possession, production and consumption of narcotics.  1927  -- Special Educational Needs Teacher of [employer] of Prohibition was responsible for tracking bootleggers and organized conservation officer, historic buildings. They focused primarily on interstate and international cases and those cases where local law enforcement official would not or could not act.  1932 -- Uniform State Narcotic Act Encouraged states to pass uniform state laws matching the federal Narcotic Drug Import and Export Act. Suggested prohibiting cannabis use at the state level.  27 -- Food, Drug, and Cosmetic Act The new law brought cosmetics and medical devices under control, and it required that drugs be labeled with adequate directions for safe use. Moreover, it mandated pre-market approval of all new drugs, such that a manufacturer would have to prove to FDA that a drug were safe before it could be sold  1951 -- Boggs Act Imposed maximum criminal penalties for violations of the import/export and internal revenue laws related to drugs and also established mandatory minimum prison sentences.  1956 -- Narcotics Control Act Increased Boggs Act penalties and mandatory prison sentence minimums for violations of existing drug laws.  1965 -- Drug Abuse Control Amendment Enacted to deal with problems caused by abuse of depressants, stimulants and hallucinogens. Restricted research into psychoactive drugs such as LSD by requiring FDA approval.  1970 -- Controlled Substance Act  Controlled Substances Import and Export Act These laws are a consolidation of numerous laws regulating the manufacture and distribution of narcotics, stimulants, depressants, hallucinogens, anabolic steroids, and chemicals used in the illicit production of controlled substances. The CSA places all substances that are regulated under existing federal law into one of five schedules. This placement is based upon  the substance's medicinal value, harmfulness, and potential for abuse or addiction. Schedule I is reserved for the most dangerous drugs that have no recognized medical use, while Schedule V is the classification used for the least dangerous drugs. The act also provides a mechanism for substances to be controlled, added to a schedule, decontrolled, removed from control, rescheduled, or transferred from one schedule to another.  82 - Drug Enforcement Agency By Executive Order, the DEA was formed to take place of the [employer] of Narcotics and Dangerous Drugs.  46 -Narcotic Addict Treatment Act of  1974  - Public Law [phone] Amends the Controlled Substance Act of 1970 to provide for the registration of practitioners conducting narcotic treatment programs. [methadone clinics] It also provides legal definitions for the phrases maintenance treatment and detoxification treatment.  1986 -- Anti-Drug Abuse Act of 1986 Strengthened Federal efforts to encourage foreign cooperation in eradicating illicit drug crops and in halting international drug traffic, to improve enforcement of Federal drug laws and enhance interdiction of illicit drug shipments, to provide strong Market researcher in establishing effective drug abuse prevention and education programs, to [employer] support for drug abuse treatment and rehabilitation efforts, and for other purposes. It also re-imposed mandatory sentencing minimums depending on which drug and how  much was involved.  1988 -- Anti-Drug Abuse Act of 1988 Established the Office of Materials Engineer (ONDCP) in the [employer] of the Economist; authorized funds for [employer], state and local drug enforcement activities, school-based drug prevention efforts, and drug abuse treatment with special emphasis on injecting drug abusers at high risk for AIDS.  2000 -- Federal - The Drug Addiction Treatment Act of 2000 (DATA 2000) It enables qualified physicians to  prescribe and/or dispense narcotics for the purpose of treating opioid dependency. For the first time, physicians are able to treat this disease from their private offices or other clinical settings. This presents a very desirable treatment option for those who are unwilling or unable to seek help in drug treatment clinics. Patients can now be treated in the privacy of their doctors office, as are other people being treated for any other type of medical condition. One medicine doctors may now prescribe is Buprenorphine. The major downfall of this Act is the limitation of 30 patients per practice - which means that large facilities, no matter how many physicians are there, can only treat 30 patients at a time.  2002-- DEA reschedules buprenorphine from a schedule V drug to a schedule III drug, on December 08, 2000 - the day before the FDA approval of Suboxone and Subutex despite overwhelming objection by the medical community.  2004: June 2004 THE CONFIDENTIALITY OF ALCOHOL  AND DRUG ABUSE PATIENT RECORDS REGULATION AND THE HIPAA PRIVACY RULE:  Confidentiality of Alcohol  and Drug Dependence Patient Records (summary) Code of Federal Regulations Title 42 Part 2 (42 CFR Part 2)  The confidentiality of alcohol  and drug dependence patient records maintained by this practice/program is protected by federal law and regulations. Generally, the practice/program may not say to a person outside the practice/program that a patient attends the practice/program, or disclose any information identifying a patient as being alcohol  or drug dependent unless:  The patient consents in writing; The disclosure is allowed by a court order, or The disclosure is made to medical personnel in a medical emergency or to qualified personnel for research,  audit, or practice/program evaluation. Violation of the federal law and regulations by a practice/program is a crime. Suspected violations may be reported to appropriate authorities  in accordance with federal regulations. Freight forwarder and regulations do not protect any information about a crime committed by a patient either at the practice/program or against any person who works for the practice/program or about any threat to commit such a crime. Federal laws and regulations do not protect any information about suspected child abuse or neglect from being reported under state law to appropriate state or local authorities.  sample consent form (MS-WORD)  2005: 10-04-2003 Public law [phone], Amends the Controlled Substances Act to eliminate the 30-patient limit for medical group practices allowed to dispense narcotic drugs in schedules III, IV, or V for maintenance or detoxification treatment (retains the 30-patient limit for an individual physician). This amendment removes the 30-patient limit on group medical practices that treat opioid dependence with buprenorphine. The restriction was part of the original Drug Addiction Treatment Act of 2000 (DATA) that allowed treatment of opioid dependence in a doctor's office. With this change, every certified doctor may now prescribe buprenorphine up to his or her individual physician limit of 30 patients.  2006: On 03/01/2005 President Levy signed Bill H.R.6344 into law. This allows physicians who have been certified to prescribe certain drugs for the treatment of opioid dependence under DATA2000 to treat up to  100 patients (up from 30) by submitting an intent notification to the Dept of Health and [employer]. This is a major step forward in both fighting the stigma and allowing access to treatment previously not available to some. For more details see 30/100-PATIENT LIMIT  2016: HHS augments regulations concerning the 30/100 patient limit by raising the limit to 275 for qualifying physicians. Link to summary of regulation  2016: Comprehensive Addiction and Recovery Act of 2016 (sec.303) amends the Controlled Substance Act - to allow Nurse  Practitioners and Physician Assistants to become eligible to prescribe buprenorphine for the treatment of opioid use disorder. See the entire law for more details.  The roots of the concurrent regulation of certain drugs under two statutory schemes go back to the beginning of this century. In 1906, Congress enacted the Pure Food and Drug Act, establishing one regime of regulation to assure (among other things) that drugs were not adulterated or misbranded. These regulations were amended several times, recodified in 1938, and expanded on again from the 1940s through the 1990s. Their implementation and enforcement is today assigned to the Food and Drug Administration (FDA) in the Department of Health and Human Services [facility]).  In 1914, Congress adopted the [place] Narcotic Act to stop abuse of addictive drugs. The [place] Narcotic Act was amended in 1937 to include marijuana. In 1965, amphetamines, barbiturates, and hallucinogens came under regulation, but under the [employer], Drug, and Cosmetic Act. In 1970, these various statutes were consolidated and recodified as the Controlled Substances Act (CSA), which has been amended several times since then. Its implementation and enforcement is today assigned to the Drug Enforcement Administration (DEA) in the Department of Justice.  The first clash occurred after World War I, when so-called morphine clinics existed and physicians prescribed or dispensed morphine to addicts. Some addicts were veterans of the American Civil War, the Spanish-American War, and WWI, who had become addicted during treatment for war wounds, but most of them came from the growing population of nonmedical addicts ([place], 8017). The Narcotics Division of the Prohibition Unit of the Department of the Treasury, which was then responsible for enforcing the [facility] Narcotic Act, concluded that this activity was not the legitimate practice of medicine but simple drug trafficking. The  Treasury Department swiftly closed the clinics and made it personally and professionally risky for physicians to maintain a narcotic addict for any reason. In did so, however, only after the American Medical Association had adopted a resolution, in 1920, opposing ambulatory clinics''.  In 1972, the public health establishment, including the Secretary of Health, Education, and Welfare, the Education Officer, Environmental, the [employer] of [employer], and the Chemical Engineer for Drug Abuse Prevention, was unprepared to allow [employer] of Narcotics and Dangerous Drugs, DEA's predecessor agency, to unilaterally define the parameters of medical practice for the use of methadone in the treatment of heroin addiction. As a consequence, a new set of rules--the third, on top of the FDA and DEA schemes--was added, one that inserted FDA deeply into the practice of medicine, notwithstanding its protestations to the contrary. Congress ratified this joint responsibility of law enforcement and public health officials for methadone through this third set of rules in 1974 with the passage of the Narcotic Addict Treatment Act (NATA). To examine in detail the evolution of this third set of rules--commonly referred to as the FDA or DHHS methadone regulations--we turn, first, to the period of the mid-1960s.  Increased use of heroin in the  post World War II period first became apparent in the early to mid 1950s. During the [employer], a minimum mandatory narcotics law was enacted in 1956, effective July 1957. 1962 [facility] conference on drug abuse, the [employer] on Narcotic and Drug Abuse (the [employer]) of 1963, the Drug Abuse Control Amendments of 1965, the President's Commission on [employer] and Administration of Justice (the [employer]) of [phone], and the Narcotic Addiction Rehabilitation Act of 1966.  The 1965 Drug Abuse Control Amendments  brought under strict federal control all nonnarcotic drugs capable of producing serious psychotoxic effects when abused. This act also created the [employer] of Drug Abuse Control within the Department of Health, Education, and Welfare (DHEW) and shifted the basis for aon corporation of illegal drugs from tax principles (administered by the Department of Treasury) to the regulation of commerce (administered by the [employer]ATION).  The 1966 Narcotic Addiction Rehabilitation Act TOUR MANAGER) authorized the civil commitment of narcotic addicts, and federal assistance to state and local governments to develop a local system of drug treatment programs. With respect to the latter, the [employer] of Mental Health [facility]) initially proposed the gradual implementation of the state assistance effort, mainly through a common mental health mechanism--inpatient treatment programs. However, because of a perceived pressing need, the courts began to commit addicts to these programs even before they were officially opened or staffed. The NARA legislation imposed the following contract requirements on treatment centers: (1) thrice-a-week counseling sessions; (2) weekly urine tests; (3) restorative dental services; (4) psychological consultations and vocational training; and (5) the treatment modalities of drug-free outpatient, therapeutic community, and methadone maintenance. Reorganization Plan No. 1 of 1968 transferred the primary functions of the [employer] of Narcotics (FBN) from the [employer] to the Department of Justice; it also transferred the [employer] of Drug Abuse Control functions to the Department of Justice. Within the [employer], the [employer] of Narcotics and Dangerous Drugs (BNDD) was created, which became the Drug Enforcement Administration in 1973.   Under the first [place] administration [phone]), federal drug abuse policy developed in a significant way. These developments included a 1969 war  on drugs presidential message, resulting legislation in 1970, and a Special Action Office created by executive order in 1971 and authorized in statute in 1972. Brynn, in 1969, to send a message to Congress on drug abuse. Although this was the first time that a U.S. president invoked the war on drugs image, it was in retrospect the most balanced approach to the problem of drug abuse that had been advanced. The 1969 message resulted in the submission of legislation to the Congress and the passage, the following year, of the Comprehensive Drug Abuse Prevention and Control Act of 1970 [employer] [phone], December 28, 1968). The act dealt with research, treatment, and prevention of drug abuse and drug dependence, and with drug abuse charity fundraiser. One major purpose of the 1970 legislation was to reverse some of the strictures of the [employer] of 1914. The 1970 act sought to clarify for the medical profession . . . the extent to which they may safely go in treating narcotic addicts as patients. Title I, in Section IV, charged the Surveyor, Minerals, Education, and Welfare, to determine the appropriate methods of professional practice in the medical treatment of the narcotic addiction of various classes of narcotic addicts. This provision constitutes the initial statutory basis for treatment standards. The law enforcement sections consolidated all prior federal statutes into the  Controlled Substances Act and the Controlled Substances Export and Import Act (Titles II and III, respectively, of the Comprehensive Drug Abuse Prevention and Control Act of 1970). Under this legislation, substances were classified under five schedules according to their abuse potential, and psychological and physical effects. Methadone was placed in Schedule II, along with such opiate drugs as morphine, codeine, and hydrocodone .  One of the most important steps taken by President Brynn was to establish in June 1971 the  Special Action Office for Drug Abuse Prevention (SAODAP) in the [employer] of the President ([place] [phone], August 18, 1969). In mid-1971, [facility] appointed Dr. Maple Dunnings as SAODAP director. Within a year, the Drug Abuse Prevention Office and Treatment Act of 1972 [employer] [phone], May 23, 1970) gave statutory authority to [facility], but limiting setting, on August 31, 1973, as the limit on its existence.  The purpose of the 1972 act was to bring the resources of the federal government to bear on drug abuse with the immediate objective of significantly reducing its incidence and developing a comprehensive, coordinated long-term federal strategy to combat drug abuse.  Narcotic Addict Treatment Act [employer]) of 1974 [employer] [phone]), which amended the Controlled Substances Act. This legislation was driven by concern for the diversion of methadone to illicit channels that was occurring in 1972 and 1973, as reflected in the title of the Senate bill adopted on August 10, 1971, the Methadone Diversion Control Act of 1973. (U.S. Senate, 1970a, 8029a).  The 1980 final rule (45 FR 37305, November 21, 1978) reduced the minimum standard for admission from two years of addiction to one year coupled with a clinical determination that the individual was currently physiologically.  The regulations were next revised in 1989, following two proposals to modify them, one in 1983 and one in 1987.  Under President Tanda Corrente, a government-wide effort was made to review all federal government regulations and to eliminate or reduce the burden of these regulations on the private sector, state and nash-finch company, and wps resources.   The 1983 recommendations, though not adopted, did initiate another revision of the methadone regulations, which first found expression in a 1987 proposed rule (52 FR 37047, December 03, 1985) and culminated in a final rule (54 FR 8954, May 04, 1987) at the end of the  decade. In the 1987 proposed rule, the FDA and NIDA, in an effort to put the best face on the unenthusiastic 1983 response by the provider community to converting the regulations to guidelines, indicated that they had retained the current requirements necessary to achieve the goals of the 1974 NATA, but were proposing to streamline the regulation and to promote more efficient operation of methadone programs. The 1987 proposed rule, issued by the FDA and NIDA, advanced the following changes in the methadone regulation: that detoxification treatment be divided into short-term (<21 days) and long-term (>21 and <180 days) treatment; that the minimum staffing ratio of one counselor to 50 patients be eliminated; that blood tests be allowed as ways to conduct initial drug screening or to meet the monthly testing requirements for six-day take-home patients; that the 72-hour notification of FDA and the pertinent state authority for methadone doses greater than 100 mg be eliminated; that special adverse reaction reporting requirements for methadone be eliminated and reliance placed upon general FDA reporting requirements; that a supervising counselor be allowed to conduct the annual review of the patient's treatment plan for certain qualified patients who had been in treatment for 3 years  or longer; and that the requirement of an annual report of methadone treatment programs to the FDA be dropped. The FDA and NIDA issued a final rule on May 04, 1987, based on comments on the 1987 proposed (54 FR 8954). Concurrently, FDA and NIDA issued a six-page guidance document, which noted that the regulations, over time, had recommended certain practices that were not actually required. Public Health Service, in Congress, and elsewhere, to reorganize the Alcohol , Drug Abuse, and Mental Health Administration (ADAMHA). These efforts culminated in the [employer] of 1992 [employer] [phone], September 11, 1990), the main  purpose of which was to transfer the research portions of the three ADAMHA institutes--NIDA, the [employer] Mills of Alcoholism and Alcohol  Abuse, and the [employer] Mills of Mental Health--to the [employer] and to create the Substance Abuse and Mental Health Services Administration [facility]) as the home for the service functions of these entitles.  Guidelines for Opioid Treatment The Federal Guidelines for Opioid Treatment Programs - 2015 serve as a guide to accrediting organizations for developing accreditation standards. The guidelines also provide OTPs with information on how programs can achieve and maintain compliance with federal regulations. The 2015 guidelines are an update to the 2007 Guidelines for the Accreditation of Opioid Treatment Programs (PDF  547 KB). The new document reflects the obligation of OTPs to deliver care consistent with the patient-centered, integrated, and recovery-oriented standards of substance use treatment.  DPT oversees the certification of OTPs and provides guidance to nonprofit organizations and state governmental entities that want to become a SAMHSA-approved accrediting body. Learn more about the accreditation and certification of OTPs and [facility] oversight of OTP accreditation bodies.  Model Guidelines for [employer] With input from [facility], the Federation of [employer]son in 2013 adopted a revised version of the federations office-based opioid treatment policies. The Model Policy on DATA 2000 and Treatment of Opioid Addiction in the Medical Office - 2013 (PDF  279 KB) provides model guidelines for use by state medical boards in regulating office-based opioid treatment.  Holiday Guidance for Opioid Treatment Programs (PDF  203 KB) In response to requests for the upcoming federal holidays and ensuing weekends (December 24th, 25th, and 26th and December 31st, Jan 1st, and Jan 2nd), this letter is to provide guidance  regarding requests for unsupervised doses of medication for patients for these dates. View a sample [id number] (PDF  194 KB).  Federal regulation of drugs emerged as early as 6, under a law that addressed only imported drugs. In 1905 the [employer] launched a private, voluntary means of controlling a substantial part of the drug marketplace, a system that remained in place for over a half-century. Drug regulation in FDA has evolved considerably since President Ricardo Para signed the 1906 Pure Food and Drugs Act.  1820 Eleven doctors set up the U.S. Pharmacopeia and record the first list of standard drugs. 1848 Drug Importation Act passed by Congress requires U.S. Customs Service inspection to stop entry of tainted, low quality drugs from overseas. 8116 Dr. Mitchell MICAEL Burrs becomes the chief chemist at the [facility] of [employer] adulteration studies.  1905 The American Medical Association [facility]) begins a voluntary program of drug approval that would last until 1955. In order to advertise in the [facility] and related journals, drug companies must show proof that the drug will treat what they claim. 1906 The original Food and Drug Act is passed by Congress on June 30 and signed by [employer].  The Act outlaws states from buying and selling food, drinks, and drugs that have been mislabeled and tainted. 1911 In U.S. v. Vicci, the [employer] that the [employer] and Drugs Act does not outlaw false medical claims but only false and misleading statements about the ingredients or identity of a drug. 1912 Congress passes the Romney Amendment to overcome the ruling in U.S. v. Vicci. The Act outlaws labeling medicines with fake medical claims that is meant to trick the buyer. 1930 The name of the Food, Drug, and Insecticide Administration is shortened to Food and Drug Administration (FDA) under an therapist, music. 1933 FDA  recommends a total rewrite of the out-of-date 1906 Food and Drugs Act.   1937 Elixir Sulfanilamide, contain the poisonous liquid, diethylene glycol, kills 107 persons, many of whom are children, dramatizing the need to establish drug safety before marketing and to pass the pending food and drug law. 1938 Congress passes [employer], Drug, and Cosmetic (FDC) Act of 1938, which requires that new drugs show safety before selling. This starts a new system of drug regulation. The Act also requires that safe limits be set for unavoidable poisonous matter and allows for factory inspections. The Directv is given power to oversee advertising for all FDAregulated products except prescription drugs. FDA states that sulfanilamide and other dangerous drugs must be given under the direction of a medical expert. This begins the requirement for prescription only (nonnarcotic) drugs (see 1951 Merced-Humphrey amendment). 1941 Nearly 300 deaths and injuries result from the use of sulfathiazole tablets, an antibiotic, tainted with the sedative, phenobarbital. In response, FDA drastically changes manufacturing and quality controls. These changes lead to the development of good manufacturing practices (GMPs). 1948 The [employer] in U.S. v. Floretta that FDA jurisdiction extends to retail stores, thereby allowing FDA to stop illegal sales of drugs by pharmacies including barbiturates and amphetamines. 1950 In Walgreen. v. U.S., a U.S. Court of Appeals rules that the directions for use on a drug label must include the drugs purpose. 1951 Congress passes the Hayes-Humphrey Amendment, which defines the kinds of drugs that cannot be used safely without medical supervision. The amendment limits sale of these drugs to prescription only by a medical professional. All other drugs are to be available without a prescription. 1952 A nationwide investigation by FDA  reveals that chloramphenicol, an antibiotic, caused nearly 180 cases of often deadly blood diseases. Two years later FDA engages the [employer] of Hospital Pharmacists, the American Association of Medical Record Librarians, and later the American Medical Association in a voluntary program of drug reaction reporting. 1953 The [employer] Amendment clarifies previous law and requires FDA to give manufacturers written reports of conditions seen during inspections and results of factory samples. 1962 Thalidomide, a new sleeping pill, causes severe birth defects of the arms and legs in thousands of babies born in [place]. The U.S. media reports on how Dr. Cathlean Mort, a FDA medical officer, helped prevent approval and marketing of Thalidomide in the [place] . These reports stirred up public support for stronger drug laws. 3 Congress passes the [employer]. For the first time, these laws require drug makers to prove their drug works before FDA can approve them for sale. The Advisory Committee on Investigational Drugs meets for the first time. This was the first meeting of a committee to advise FDA on product approval and policy on an ongoing basis. 1966 FDA contracts with the [facility]  of [employer] to measure the effectiveness of 4,000 marketed drugs approved on the basis of safety alone between [id number] and 03/04/1961. The Fair Packaging and Labeling Act requires all consumer products, in interstate commerce, to be honestly and informatively labeled. 03/05/67 FDA forms the Drug Efficacy Study Implementation (DESI) to carry out recommendations of the [employer] of the effectiveness of drugs first sold between [place] and 01-Jan-1963Jan 01, 1971 FDA requires the first patient package insert, medicines must come with information for the patient about risks and benefits. 1972 Over-the-Counter Drug Review begins  to enhance the safety, effectiveness and appropriate labeling of drugs sold without prescription. 1973 The U.S. Supreme Court upholds the [place] drug effectiveness law and approves FDAs action to control entire classes of products. 1982 FDA issues Tamper-resistant Packaging Regulations to prevent poisonings such as deaths from cyanide placed in Tylenol  capsules. Congress passes the [employer] in 1982/03/04, making it a crime to tamper with packaged consumer products. 1983/03/05 Drug Price Advertising Account Planner Act (Hatch-Waxman Act) increases the availability of less costly generic drugs by allowing FDA to approve applications for generic versions of brand-name drugs without repeating the research that proved the safety and effectiveness of the brand-name drugs. The Act also allowed brand-name companies to apply for up to five years additional patent protection for the new medicines they developed to make up for time lost while their products were going through FDA's approval process. 1989 The FDA issued guidelines asking drug makers to decide if a drug is likely to have usefulness in elderly people and to include elderly people in studies when applicable. 1991 In 03/04/80, the FDA and the Department of Health and Human Services published a policy on protecting people in research. In 03/04/90, this policy is adopted by more than a dozen federal agencies involved in human subject research and becomes known as the Common Rule. 4 1993 FDA launches MedWatch, a system designed to collect reports from health professionals on problems with drugs and other medical products. FDA issues guidelines for measuring gender differences in responses to medication. Drug companies are encouraged to include patients of both sexes in their research of drugs and to study any gender-specific effects. 1995 FDA declares cigarettes to be drug delivery devices. Limits are issued on marketing and  sales to reduce smoking by young people. 1998 FDA introduces the Adverse Event Reporting System (AERS), a computerized database designed to store and study safety reports on already marketed drugs.  The Demographic Rule requires that a marketing application review data on safety and effectiveness by age, gender, and race. The Pediatric Rule requires drug makers of selected new and existing drugs to conduct studies on drug safety and effectiveness in children. 1999 Creation of the Drug Facts Label for OTC drug products. The law requires all overthe-counter drug labels to have information in a standard format. These drug facts labels are designed to give the user easy-to-find information. 2000 The U. S. [employer], upholds an earlier decision from The [employer] and Drug Administration v. [employer]. et al. and rules 5-4 that FDA does not have authority to regulate tobacco as a drug. 03-04-01 The Best Pharmaceuticals for Children Act, in exchange for studying the drug in children, the drug maker gets six months of selling their product without competition. 03/04/2002 The Pediatric Research Equity Act gives FDA the right to ask drug companies to study the effectiveness of new drugs in children. 2003/03/05 FDA advises medical professionals to  limit the use of a pain reliever called Cox-2, a nonsteroidal anti-inflammatory drug (NSAIDs). Studies had shown that long-term use raised chances of heart attacks and strokes. The warning is also added to the over-thecounter NSAIDs Drug Facts label. Medicines used in hospitals must have a bar code to prevent patients from receiving the wrong medicine. 5 2005 The Drug Safety Board is formed, consisting of FDA staff and representatives from the [employer] of [address] and the [employer]. The Board advises the Director, Center for Drug Evaluation and Research, FDA, on drug safety issues and works with the agency in sharing safety  information to health professionals and patients.  The [place]  Food and Drug Administration (FDA) was first created to enforce the Pure Food and Drug Act of 1906. In this capacity, the FDA is charged with protecting the health of the [place]  public, to ensure the quality of its food, medicine, and cosmetics. Before this time, the [place]  government had no formal oversight of these products and left issues of quality and purity to the individual manufactures, or at times, individual states.    Review: [place] Stop ACT. (The Strengthen Opioid Misuse Prevention (STOP) Act of 2017). GENERAL ASSEMBLY OF [place]  SESSION 2017 SESSION LAW 2017-74 HOUSE BILL 243  PMP mandatory The dispenser shall report: (1) The dispenser's DEA number. (2) The name of the patient for whom the controlled substance is being dispensed, and the patient's: a. Full address, including city, state, and zip code, b. Telephone number, and c. Date of birth. (3) The date the prescription was written. (4) The date the prescription was filled. (5) The prescription number. (6) Whether the prescription is new or a refill. (7) Metric quantity of the dispensed drug. (8) Estimated days of supply of dispensed drug, if provided to the dispenser. (9) National Drug Code of dispensed drug. (10) Prescriber's DEA number. (11) Method of payment for the prescription.  No paper prescriptions  Duration of scripts Acute vs Chronic prescribing  2016 CDC Guidelines for prescribing Opioids for Chronic Pain. (Updated in 2022.) Medical Board  Laws:  Prescription Laws Drug laws, rules, and regulations are constantly changing. Any attempt to summarize them would quickly become outdated. Because of that, the Board encourages practitioners who seek guidance on prescribing procedures to refer to the sources listed below in addition to the Boards position statements, rules and Medical Practice Act.  [place]  Board of Pharmacy  (NCBOP) (which offers the states pharmacy laws and rules, and links to the Code of Federal Regulations) [employer] Site: www.ncbop.org  [place]  General Statutes General Web Site: politicalpool.cz See: [place]  Food, Drug, and Cosmetic Act: S293155 & 106-134 See: [place]  Pharmacy Practice Act, Article 4A: [phone] See: [place]  Controlled Substances Act, Article 5: 90-86 & 90-113.8 See: Use of controlled substances to render one mentally incapacitated or physically helpless: [facility]. Code, Title 21, Food & Drugs www.deadiversion.usdoj.gov Controlled Substances Schedules www.deadiversion.usdoj.gov Drug Warehouse Manager - www.deadiversion.usdoj.gov 42 CFR  8.12 - Federal opioid treatment standards.   Effective October 29, 2015, prior approval will be required for opioid analgesic doses for [facility]. Medicaid and [facility]) beneficiaries which:  Exceed 120 mg of morphine equivalents (MME) per day  Are greater than a 14-day supply of any opioid, or,  Are non-preferred opioid products on the [place] Medicaid Preferred Drug List (PDL)  FEDERAL 42 CFR  8.12 - Federal opioid treatment standards. Title II of the Comprehensive Drug Abuse  Prevention and Control Act of 1970, commonly known as the Controlled Substance Act (CSA) Title 21 [place]  Code ([place]) Controlled Substances Act.   Reference:   ______________________________________________________________________       ______________________________________________________________________    Medication Rules  Purpose: To inform patients, and their family members, of our medication rules and regulations.  Applies to: All patients receiving prescriptions from our practice (written or electronic).  Pharmacy of record: This is the pharmacy where your electronic prescriptions will be sent. Make sure we have the correct one.  Electronic  prescriptions: In compliance with the [place]  Strengthen Opioid Misuse Prevention (STOP) Act of 2017 (Session Law 2017-74/H243), effective March 04, 2018, all controlled substances must be electronically prescribed. Written prescriptions, faxing, or calling prescriptions to a pharmacy will no longer be done.  Prescription refills: These will be provided only during in-person appointments. No medications will be renewed without a face-to-face evaluation with your provider. Applies to all prescriptions.  NOTE: The following applies primarily to controlled substances (Opioid* Pain Medications).   Type of encounter (visit): For patients receiving controlled substances, face-to-face visits are required. (Not an option and not up to the patient.)  Patient's Responsibilities: Pain Pills: Bring all pain pills to every appointment (except for procedure appointments). Pill counts are required.  Pill Bottles: Bring pills in original pharmacy bottle. Bring bottle, even if empty. Always bring the bottle of the most recent fill.  Medication refills: You are responsible for knowing and keeping track of what medications you are taking and when is it that you will need a refill. The day before your appointment: write a list of all prescriptions that need to be refilled. The day of the appointment: give the list to the admitting nurse. Prescriptions will be written only during appointments. No prescriptions will be written on procedure days. If you forget a medication: it will not be Called in, Faxed, or electronically sent. You will need to get another appointment to get these prescribed. No early refills. Do not call asking to have your prescription filled early. Partial  or short prescriptions: Occasionally your pharmacy may not have enough pills to fill your prescription.  NEVER ACCEPT a partial fill or a prescription that is short of the total amount of pills that you were prescribed.  With  controlled substances the law allows 72 hours for the pharmacy to complete the prescription.  If the prescription is not completed within 72 hours, the pharmacist will require a new prescription to be written. This means that you will be short on your medicine and we WILL NOT send another prescription to complete your original prescription.  Instead, request the pharmacy to send a carrier to a nearby branch to get enough medication to provide you with your full prescription. Prescription Accuracy: You are responsible for carefully inspecting your prescriptions before leaving our office. Have the discharge nurse carefully go over each prescription with you, before taking them home. Make sure that your name is accurately spelled, that your address is correct. Check the name and dose of your medication to make sure it is accurate. Check the number of pills, and the written instructions to make sure they are clear and accurate. Make sure that you are given enough medication to last until your next medication refill appointment. Taking Medication: Take medication as prescribed. When it comes to controlled substances, taking less pills or less frequently than prescribed is permitted and encouraged. Never take more pills than instructed. Never take the medication more frequently than  prescribed.  Inform other Doctors: Always inform, all of your healthcare providers, of all the medications you take. Pain Medication from other Providers: You are not allowed to accept any additional pain medication from any other Doctor or Healthcare provider. There are two exceptions to this rule. (see below) In the event that you require additional pain medication, you are responsible for notifying us , as stated below. Cough Medicine: Often these contain an opioid, such as codeine or hydrocodone . Never accept or take cough medicine containing these opioids if you are already taking an opioid* medication. The combination may cause  respiratory failure and death. Medication Agreement: You are responsible for carefully reading and following our Medication Agreement. This must be signed before receiving any prescriptions from our practice. Safely store a copy of your signed Agreement. Violations to the Agreement will result in no further prescriptions. (Additional copies of our Medication Agreement are available upon request.) Laws, Rules, & Regulations: All patients are expected to follow all [address] and [employer], Rules, [place] [employer]. Ignorance of the Laws does not constitute a valid excuse.  Illegal drugs and Controlled Substances: The use of illegal substances (including, but not limited to marijuana and its derivatives) and/or the illegal use of any controlled substances is strictly prohibited. Violation of this rule may result in the immediate and permanent discontinuation of any and all prescriptions being written by our practice. The use of any illegal substances is prohibited. Adopted CDC guidelines & recommendations: Target dosing levels will be at or below 60 MME/day. Use of benzodiazepines** is not recommended. Urine Drug testing: Patients taking controlled substances will be required to provide a urine sample upon request. Do not void before coming to your medication management appointments. Hold emptying your bladder until you are admitted. The admitting nurse will inform you if a sample is required. Our practice reserves the right to call you at any time to provide a sample. Once receiving the call, you have 24 hours to comply with request. Not providing a sample upon request may result in termination of medication therapy.  Exceptions: There are only two exceptions to the rule of not receiving pain medications from other Healthcare Providers. Exception #1 (Emergencies): In the event of an emergency (i.e.: accident requiring emergency care), you are allowed to receive additional pain medication. However, you are  responsible for: As soon as you are able, call our office [phone], at any time of the day or night, and leave a message stating your name, the date and nature of the emergency, and the name and dose of the medication prescribed. In the event that your call is answered by a member of our staff, make sure to document and save the date, time, and the name of the person that took your information.  Exception #2 (Planned Surgery): In the event that you are scheduled by another doctor or dentist to have any type of surgery or procedure, you are allowed (for a period no longer than 30 days), to receive additional pain medication, for the acute post-op pain. However, in this case, you are responsible for picking up a copy of our Post-op Pain Management for Surgeons handout, and giving it to your surgeon or dentist. This document is available at our office, and does not require an appointment to obtain it. Simply go to our office during business hours (Monday-Thursday from 8:00 AM to 4:00 PM) (Friday 8:00 AM to 12:00 Noon) or if you have a scheduled appointment with us , prior to your surgery,  and ask for it by name. In addition, you are responsible for: calling our office [phone], at any time of the day or night, and leaving a message stating your name, name of your surgeon, type of surgery, and date of procedure or surgery. Failure to comply with your responsibilities may result in termination of therapy involving the controlled substances.  Consequences:  Non-compliance with the above rules may result in permanent discontinuation of medication prescription therapy. All patients receiving any type of controlled substance is expected to comply with the above patient responsibilities. Not doing so may result in permanent discontinuation of medication prescription therapy. Medication Agreement Violation. Following the above rules, including your responsibilities will help you in avoiding a Medication  Agreement Violation (Breaking your Pain Medication Contract).  *Opioid medications include: morphine, codeine, oxycodone , oxymorphone, hydrocodone , hydromorphone, meperidine, tramadol, tapentadol, buprenorphine, fentanyl , methadone. **Benzodiazepine medications include: diazepam (Valium), alprazolam  (Xanax ), clonazepam (Klonopine), lorazepam (Ativan), clorazepate (Tranxene), chlordiazepoxide (Librium), estazolam (Prosom), oxazepam (Serax), temazepam (Restoril), triazolam (Halcion) (Last updated: 12/25/2022) ______________________________________________________________________     ______________________________________________________________________    Medication Recommendations and Reminders  Applies to: All patients receiving prescriptions (written and/or electronic).  Medication Rules & Regulations: You are responsible for reading, knowing, and following our Medication Rules document. These exist for your safety and that of others. They are not flexible and neither are we. Dismissing or ignoring them is an act of non-compliance that may result in complete and irreversible termination of such medication therapy. For safety reasons, non-compliance will not be tolerated. As with the U.S[place] fundamental legal principle of ignorance of the law is no defense, we will accept no excuses for not having read and knowing the content of documents provided to you by our practice.  Pharmacy of record:  Definition: This is the pharmacy where your electronic prescriptions will be sent.  We do not endorse any particular pharmacy. It is up to you and your insurance to decide what pharmacy to use.  We do not restrict you in your choice of pharmacy. However, once we write for your prescriptions, we will NOT be re-sending more prescriptions to fix restricted supply problems created by your pharmacy, or your insurance.  The pharmacy listed in the electronic medical record should be the one where you want  electronic prescriptions to be sent. If you choose to change pharmacy, simply notify our nursing staff. Changes will be made only during your regular appointments and not over the phone.  Recommendations: Keep all of your pain medications in a safe place, under lock and key, even if you live alone. We will NOT replace lost, stolen, or damaged medication. We do not accept Police Reports as proof of medications having been stolen. After you fill your prescription, take 1 week's worth of pills and put them away in a safe place. You should keep a separate, properly labeled bottle for this purpose. The remainder should be kept in the original bottle. Use this as your primary supply, until it runs out. Once it's gone, then you know that you have 1 week's worth of medicine, and it is time to come in for a prescription refill. If you do this correctly, it is unlikely that you will ever run out of medicine. To make sure that the above recommendation works, it is very important that you make sure your medication refill appointments are scheduled at least 1 week before you run out of medicine. To do this in an effective manner, make sure that you do not leave the office  without scheduling your next medication management appointment. Always ask the nursing staff to show you in your prescription , when your medication will be running out. Then arrange for the receptionist to get you a return appointment, at least 7 days before you run out of medicine. Do not wait until you have 1 or 2 pills left, to come in. This is very poor planning and does not take into consideration that we may need to cancel appointments due to bad weather, sickness, or emergencies affecting our staff. DO NOT ACCEPT A Partial Fill: If for any reason your pharmacy does not have enough pills/tablets to completely fill or refill your prescription, do not allow for a partial fill. The law allows the pharmacy to complete that prescription within 72  hours, without requiring a new prescription. If they do not fill the rest of your prescription within those 72 hours, you will need a separate prescription to fill the remaining amount, which we will NOT provide. If the reason for the partial fill is your insurance, you will need to talk to the pharmacist about payment alternatives for the remaining tablets, but again, DO NOT ACCEPT A PARTIAL FILL, unless you can trust your pharmacist to obtain the remainder of the pills within 72 hours.  Prescription refills and/or changes in medication(s):  Prescription refills, and/or changes in dose or medication, will be conducted only during scheduled medication management appointments. (Applies to both, written and electronic prescriptions.) No refills on procedure days. No medication will be changed or started on procedure days. No changes, adjustments, and/or refills will be conducted on a procedure day. Doing so will interfere with the diagnostic portion of the procedure. No phone refills. No medications will be called into the pharmacy. No Fax refills. No weekend refills. No Holliday refills. No after hours refills.  Remember:  Business hours are:  Monday to Thursday 8:00 AM to 4:00 PM Provider's Schedule: Eric Como, MD - Appointments are:  Medication management: Monday and Wednesday 8:00 AM to 4:00 PM Procedure day: Tuesday and Thursday 7:30 AM to 4:00 PM Wallie Sherry, MD - Appointments are:  Medication management: Tuesday and Thursday 8:00 AM to 4:00 PM Procedure day: Monday and Wednesday 7:30 AM to 4:00 PM (Last update: 12/25/2021) ______________________________________________________________________     ______________________________________________________________________    National Pain Medication Shortage  The [place] is experiencing worsening drug shortages. These have had a negative widespread effect on patient care and treatment. Not expected to improve any time soon.  Predicted to last past 2029.   Drug shortage list (generic names) Oxycodone  IR Oxycodone /APAP Oxymorphone IR Hydromorphone Hydrocodone /APAP Morphine  Where is the problem?  Manufacturing and supply level.  Will this shortage affect you?  Only if you take any of the above pain medications.  How? You may be unable to fill your prescription.  Your pharmacist may offer a partial fill of your prescription. (Warning: Do not accept partial fills.) Prescriptions partially filled cannot be transferred to another pharmacy. Read our Medication Rules and Regulation. Depending on how much medicine you are dependent on, you may experience withdrawals when unable to get the medication.  Recommendations: Consider ending your dependence on opioid pain medications. Ask your pain specialist to assist you with the process. Consider switching to a medication currently not in shortage, such as Buprenorphine. Talk to your pain specialist about this option. Consider decreasing your pain medication requirements by managing tolerance thru Drug Holidays. This may help minimize withdrawals, should you run out of medicine. Control your pain  thru the use of non-pharmacological interventional therapies.   Your prescriber: Prescribers cannot be blamed for shortages. Medication manufacturing and supply issues cannot be fixed by the prescriber.   NOTE: The prescriber is not responsible for supplying the medication, or solving supply issues. Work with your pharmacist to solve it. The patient is responsible for the decision to take or continue taking the medication and for identifying and securing a legal supply source. By law, supplying the medication is the job and responsibility of the pharmacy. The prescriber is responsible for the evaluation, monitoring, and prescribing of these medications.   Prescribers will NOT: Re-issue prescriptions that have been partially filled. Re-issue prescriptions already sent to  a pharmacy.  Re-send prescriptions to a different pharmacy because yours did not have your medication. Ask pharmacist to order more medicine or transfer the prescription to another pharmacy. (Read below.)  New 2023 regulation: November 02, 2021 Revised Regulation Allows DEA-Registered Pharmacies to Transfer Electronic Prescriptions at a Patients Request DEA Headquarters Division - Public Information Office Patients now have the ability to request their electronic prescription be transferred to another pharmacy without having to go back to their practitioner to initiate the request. This revised regulation went into effect on Monday, October 29, 2021.     At a patients request, a DEA-registered retail pharmacy can now transfer an electronic prescription for a controlled substance (schedules II-V) to another DEA-registered retail pharmacy. Prior to this change, patients would have to go through their practitioner to cancel their prescription and have it re-issued to a different pharmacy. The process was taxing and time consuming for both patients and practitioners.    The Drug Enforcement Administration [facility]) published its intent to revise the process for transferring electronic prescriptions on January 21, 2020.  The final rule was published in the federal register on September 27, 2021 and went into effect 30 days later.  Under the final rule, a prescription can only be transferred once between pharmacies, and only if allowed under existing state or other applicable law. The prescription must remain in its electronic form; may not be altered in any way; and the transfer must be communicated directly between two licensed pharmacists. Its important to note, any authorized refills transfer with the original prescription, which means the entire prescription will be filled at the same pharmacy.   Reference: hugehand.is [facility] website announcement)  Cheapwipes.at.pdf [employer])   [employer] / Vol. 88, No. 143 / Thursday, September 27, 2021 / Rules and Regulations DEPARTMENT OF JUSTICE  Drug Enforcement Administration  21 CFR Part 1306  [Docket No. [id number]]  RIN [id number] Transfer of Electronic Prescriptions for Schedules II-V Controlled Substances Between Pharmacies for Initial Filling  ______________________________________________________________________       ______________________________________________________________________    Transfer of Pain Medication between Pharmacies  Re: 2023 DEA Clarification on existing regulation  Published on DEA Website: November 02, 2021  Title: Revised Regulation Allows DEA-Registered Pharmacies to Electrical Engineer Prescriptions at a Patients Request DEA Headquarters Division - [employer]  Patients now have the ability to request their electronic prescription be transferred to another pharmacy without having to go back to their practitioner to initiate the request. This revised regulation went into effect on Monday, October 29, 2021.     At a patients request, a DEA-registered retail pharmacy can now transfer an electronic prescription for a controlled substance (schedules II-V) to another DEA-registered retail pharmacy. Prior to this change, patients would have to go through their practitioner  to cancel their prescription and have it re-issued to a different pharmacy. The process was taxing and time consuming for both patients and practitioners.    The Drug Enforcement Administration [facility]) published its intent to revise the process for transferring electronic prescriptions on January 21, 2020.  The final rule was published in the  federal register on September 27, 2021 and went into effect 30 days later.  Under the final rule, a prescription can only be transferred once between pharmacies, and only if allowed under existing state or other applicable law. The prescription must remain in its electronic form; may not be altered in any way; and the transfer must be communicated directly between two licensed pharmacists. Its important to note, any authorized refills transfer with the original prescription, which means the entire prescription will be filled at the same pharmacy.    REFERENCES: 1. DEA website announcement hugehand.is  2. Department of Justice website  Cheapwipes.at.pdf  3. DEPARTMENT OF JUSTICE Drug Enforcement Administration 21 CFR Part 1306 [Docket No. [id number]] RIN [id number]-AB64 Transfer of Electronic Prescriptions for Schedules II-V Controlled Substances Between Pharmacies for Initial Filling  ______________________________________________________________________       ______________________________________________________________________    Medication Transfer   Notification You are currently compliant and stable on your pain medication regimen. This regimen will be transferred today to your Primary Care Provider (PCP). You will be provided with enough prescriptions to last for 90 days. After that, your prescriptions will need to be taken over by your PCP.  Recommendation Immediately contact your primary care provider to secure an appointment for evaluation before this period is over. Do not wait until the last month to contact them.   Clarification The transfer of your medication regimen does not mean that you are being discharged from our clinic. We will remain available to you for any consultation or interventional therapies you may need.    Alternative Should you decide not to continue taking these medication and would like assistance in permanently stopping them, please let us  know so that we can design a slow tapering down of your regimen.  Reason Our primary responsibility to provide specialized interventional pain management therapies otherwise not available to the community. We have in the past assisted primary care providers with reviewing and adjusting pain medication management therapies, however, we have been transparent to all patients and referring providers that it is not our intention to permanently take over this type of therapy. Transfer of this portion of your care will assist us  in freeing time to assist others in need of our specialty services.   ______________________________________________________________________      ______________________________________________________________________    WARNING: CBD (cannabidiol) & Delta (Delta-8 tetrahydrocannabinol) products.   Applicable to:  All individuals currently taking or considering taking CBD (cannabidiol) and, more important, all patients taking opioid analgesic controlled substances (pain medication). (Example: oxycodone ; oxymorphone; hydrocodone ; hydromorphone; morphine; methadone; tramadol; tapentadol; fentanyl ; buprenorphine; butorphanol; dextromethorphan; meperidine; codeine; etc.)  Introduction:  Recently there has been a drive towards the use of natural products for the treatment of different conditions, including pain anxiety and sleep disorders. Marijuana and hemp are two varieties of the cannabis genus plants. Marijuana and its derivatives are illegal, while hemp and its derivatives are not. Cannabidiol (CBD) and tetrahydrocannabinol (THC), are two natural compounds found in plants of the Cannabis genus. They can both be extracted from hemp or marijuana. Both compounds interact with your bodys endocannabinoid system in very different ways. CBD is  associated with pain relief (analgesia) while THC  is associated with the psychoactive effects (the high) obtained from the use of marijuana products. There are two main types of THC: Delta-9, which comes from the marijuana plant and it is illegal, and Delta-8, which comes from the hemp plant, and it is legal. (Both, Delta-9-THC and Delta-8-THC are psychoactive and give you the high.)   Legality:  Marijuana and its derivatives: illegal Hemp and its derivatives: Legal (State dependent) UPDATE: (04/20/2021) The Drug Enforcement Agency (DEA) issued a letter stating that delta cannabinoids, including Delta-8-THCO and Delta-9-THCO, synthetically derived from hemp do not qualify as hemp and will be viewed as Schedule I drugs. (Schedule I drugs, substances, or chemicals are defined as drugs with no currently accepted medical use and a high potential for abuse. Some examples of Schedule I drugs are: heroin, lysergic acid diethylamide (LSD), marijuana (cannabis), 3,4-methylenedioxymethamphetamine (ecstasy), methaqualone, and peyote.) (cuetune.com.ee)  Legal status of CBD in [place]:  Conditionally Legal  Reference: FDA Regulation of Cannabis and Cannabis-Derived Products, Including Cannabidiol (CBD) - oemdeals.dk  Warning:  CBD is not FDA approved and has not undergo the same manufacturing controls as prescription drugs.  This means that the purity and safety of available CBD may be questionable. Most of the time, despite manufacturer's claims, it is contaminated with THC (delta-9-tetrahydrocannabinol - the chemical in marijuana responsible for the HIGH).  When this is the case, the [facility] contaminant will trigger a positive urine drug screen (UDS) test for Marijuana (carboxy-THC).   The FDA recently put out a warning about 5 things that everyone should be aware of regarding Delta-8  THC: Delta-8 THC products have not been evaluated or approved by the FDA for safe use and may be marketed in ways that put the public health at risk. The FDA has received adverse event reports involving delta-8 THC-containing products. Delta-8 THC has psychoactive and intoxicating effects. Delta-8 THC manufacturing often involve use of potentially harmful chemicals to create the concentrations of delta-8 THC claimed in the marketplace. The final delta-8 THC product may have potentially harmful by-products (contaminants) due to the chemicals used in the process. Manufacturing of delta-8 THC products may occur in uncontrolled or unsanitary settings, which may lead to the presence of unsafe contaminants or other potentially harmful substances. Delta-8 THC products should be kept out of the reach of children and pets.  NOTE: Because a positive UDS for any illicit substance is a violation of our medication agreement, your opioid analgesics (pain medicine) may be permanently discontinued.  MORE ABOUT CBD  General Information: CBD was discovered in [birth year] and it is a derivative of the cannabis sativa genus plants (Marijuana and Hemp). It is one of the 113 identified substances found in Marijuana. It accounts for up to 40% of the plant's extract. As of 2018, preliminary clinical studies on CBD included research for the treatment of anxiety, movement disorders, and pain. CBD is available and consumed in multiple forms, including inhalation of smoke or vapor, as an aerosol spray, and by mouth. It may be supplied as an oil containing CBD, capsules, dried cannabis, or as a liquid solution. CBD is thought not to be as psychoactive as THC (delta-9-tetrahydrocannabinol - the chemical in marijuana responsible for the HIGH). Studies suggest that CBD may interact with different biological target receptors in the body, including cannabinoid and other neurotransmitter receptors. As of 2018 the mechanism of action for its  biological effects has not been determined.  Side-effects  Adverse reactions: Dry mouth, diarrhea, decreased appetite, fatigue, drowsiness, malaise, weakness, sleep disturbances, and  others.  Drug interactions:  CBD may interact with medications such as blood-thinners. CBD causes drowsiness on its own and it will increase drowsiness caused by other medications, including antihistamines (such as Benadryl), benzodiazepines (Xanax , Ativan, Valium), antipsychotics, antidepressants, opioids, alcohol  and supplements such as kava, melatonin and St. John's Wort.  Other drug interactions: Brivaracetam (Briviact); Caffeine; Carbamazepine (Tegretol); Citalopram (Celexa); Clobazam (Onfi); Eslicarbazepine (Aptiom); Everolimus (Zostress); Lithium; Methadone (Dolophine); Rufinamide (Banzel); Sedative medications (CNS depressants); Sirolimus (Rapamune); Stiripentol (Diacomit); Tacrolimus (Prograf); Tamoxifen ; Soltamox); Topiramate (Topamax); Valproate; Warfarin (Coumadin ); Zonisamide. (Last update: 02/11/2022) ______________________________________________________________________     ______________________________________________________________________    Muscle Spasms & Cramps  Cause(s):  Most common - vitamin and/or electrolyte (calcium , potassium, sodium, etc.) deficiencies. Post procedure - steroids (injected, oral, or inhaled) can make your kidneys excrete (loose) electrolytes. Most of the time this will not cause any symptoms however, if you happen to be borderline low on your electrolytes, it may temporarily triggering cramps & spasms.  Possible triggers: Sweating - causes loss of electrolytes thru the skin. Steroids - causes loss of electrolytes thru the urine.  Treatment: (over-the-counter)  Gatorade (or any other electrolyte-replenishing drink) - Take 1, 8 oz glass with each meal (3 times a day). Mechanism of action: Replenishes lost electrolytes. Magnesium  400 to 500 mg - Take 1 tablet twice a  day (one with breakfast and one at bedtime). If you have kidney disease talk to your primary care physician before taking any Magnesium . Mechanism of action: Magnesium  is a natural muscle relaxant. Tonic Water with quinine - Take 1, 8 oz glass before bedtime.  Mechanism of action: Quinine is used to treat spasms.  Last Update: 09/12/2022  ______________________________________________________________________     ______________________________________________________________________    Appointment Information  It is our goal and responsibility to provide the medical community with assistance in the evaluation and management of patients with chronic pain. Unfortunately our resources are limited. Because we do not have an unlimited amount of time, or available appointments, we are required to closely monitor for unkept or cancelled appointments.  Patient's responsibilities: 1. Punctuality: Patients are required to be physically present in our office at least 15 minutes before their scheduled appointment. 2. Tardiness: Patients not physically present in our office at their scheduled appointment time will be rescheduled. 3. Plan ahead: Assume that you will encounter traffic and plan to arrive 30 minutes before your appointment. 4. Other appointments and responsibilities: Do not schedule other appointments immediately before or after your scheduled appointment.  5. Be prepared: Make a list of everything that you need to discuss with your provider so that you use your time efficiently. Once the provider leaves your room, he/she will not return to your room to discuss anything that you neglected to bring up during your allowed time. 6. No children or pets: Do not bring children or pets to your appointment. 7. Cancelling or rescheduling your appointment: Advanced notification (more than 24 hours in advance) is required. 8. No Show: Not calling to cancel an appointment and simply not showing up is  unacceptable. This leads to loss of appointments that could have been used by a patient in need. (See below)  Corrective process for repeat offenders:  No Shows: Three (3) No Shows within a 12 month period will result in an automatic discharge from our practice. Rescheduling or cancelling with more than 24 hours notice will not be penalized and will not count against you. Tardiness: If you have to be rescheduled three (3) times due to late arrivals,  it will be counted as one (1) No Show. Cancellation or reschedule: Three (3) cancellations or rescheduling where notice was given with less than 24 hours in advance, will be recorded as one (1) No Show.  Types of appointments: New patient initial evaluation: These are evaluations only. Your initial patient questionnaire will be collected and entered into the system. A history of present illness will be taken. Prior lab work, imaging studies, and associated treatments will be reviewed. The provider may order appropriate diagnostic testing depending on their evaluation and review of available information. No treatments will be started on this visit. 2nd Follow-up visit: During this visit your provider will inform you of the results of the diagnostic tests ordered on the initial evaluation. Based on the providers assessment, treatment options will be offered, at which the patient will decide if he/she is interested in the alternatives. If interested, a treatment plan will be established and started. Procedure visits: Post-procedure evaluation visits: Evaluation visits MM New problems Flare-up evaluations Follow-up after diagnostic testing ______________________________________________________________________

## 2024-02-18 NOTE — Progress Notes (Signed)
 Nursing Pain Medication Assessment:  Safety precautions to be maintained throughout the outpatient stay will include: orient to surroundings, keep bed in low position, maintain call bell within reach at all times, provide assistance with transfer out of bed and ambulation.  Medication Inspection Compliance: Pill count conducted under aseptic conditions, in front of the patient. Neither the pills nor the bottle was removed from the patient's sight at any time. Once count was completed pills were immediately returned to the patient in their original bottle.  Medication: Morphine  IR Pill/Patch Count: 8 of 120 pills/patches remain Pill/Patch Appearance: Markings consistent with prescribed medication Bottle Appearance: Standard pharmacy container. Clearly labeled. Filled Date: 33 / 54 / 2025 Last Medication intake:  Today

## 2024-02-18 NOTE — Progress Notes (Signed)
 PROVIDER NOTE: Interpretation of information contained herein should be left to medically-trained personnel. Specific patient instructions are provided elsewhere under Patient Instructions section of medical record. This document was created in part using AI and STT-dictation technology, any transcriptional errors that may result from this process are unintentional.  Patient: Kristopher [name]  Service: E/M   PCP: Vicci Duwaine SQUIBB, DO  DOB: April 08, 1960  DOS: 02/18/2024  Provider: Emmy MARLA Blanch, NP  MRN: [id number]  Delivery: Face-to-face  Specialty: Interventional Pain Management  Type: Established Patient  Setting: Ambulatory outpatient facility  Specialty designation: 09  Referring Prov.: Vicci Duwaine SQUIBB, DO  Location: Outpatient office facility       History of present illness (HPI) [name], a 63 y.o. year old male, is here today because of his Chronic bilateral low back pain with bilateral sciatica [M54.42, M54.41, G89.29]. Kristopher [name] primary complain today is Back Pain  Pertinent problems: Kristopher [name] has Chronic low back pain (1ry area of Pain) (Bilateral) (L>R); Lumbar spondylosis; Chronic lumbar radicular pain (S1 dermatomal) (Left); 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); Chronic neck pain (posterior midline) (Bilateral) (L>R); Cervical spondylosis; Chronic cervical radicular pain (Bilateral) (C5/C6 dermatome) (L>R); Long term current use of opiate analgesic; Long term prescription opiate use; Opiate use (60 MME/Day); Uncomplicated opioid dependence (HCC); Cervical spinal stenosis (C4-5); Cervical foraminal stenosis (Bilateral C5-6); Retrolisthesis of L5-S1; Cervical disc herniation (C4-5 and C5-6); Lumbar disc herniation (L5-S1); Chronic sacroiliac joint pain (Bilateral) (L>R); Chronic hip pain (Left); Lumbar facet syndrome (Bilateral) (L>R); Chronic lower extremity pain (2ry area of Pain) (Left); Greater occipital neuralgia (Right); Chronic pain syndrome;  Pharmacologic therapy; Musculoskeletal pain, chronic; Left-sided weakness; Hip arthritis; S/P hip replacement; Lung mass; Squamous cell carcinoma of left lung (HCC); Cancer associated pain; and Malignant neoplasm of lower lobe of left lung (HCC) on their pertinent problem list.  Pain Assessment: Severity of Chronic pain is reported as a 4 /10. Location: Back Lower/Down left leg to left knee. Onset: More than a month ago. Quality: Stabbing. Timing: Constant. Modifying factor(s): Pain medication. Vitals:  height is 5' 10 (1.778 m) and weight is 210 lb (95.3 kg). His temporal temperature is 97.2 F (36.2 C) (abnormal). His blood pressure is 122/91 (abnormal) and his pulse is 70. His respiration is 18 and oxygen saturation is 99%.  BMI: Estimated body mass index is 30.13 kg/m as calculated from the following:   Height as of this encounter: 5' 10 (1.778 m).   Weight as of this encounter: 210 lb (95.3 kg).  Last encounter: 11/19/2023. Last procedure: 11/19/2023.  Reason for encounter: medication management.  The patient indicates doing well with current medication regimen.  No side effects or adverse reaction reported to medication.  Discussed the use of AI scribe software for clinical note transcription with the patient, who gave verbal consent to proceed.  History of Present Illness   Kristopher [name] is a 63 year old male who presents with chronic back pain.  He experiences chronic pain primarily in the lower back, extending approximately six inches upwards to the thoracic area, and sometimes slightly higher near L1, L2. The pain is persistent and varies in intensity.  He has a history of an allergic reaction to injections administered in [place] , which required hospitalization. Since then, he has not received any further injections for his back pain.  He is currently taking morphine  15 mg every six hours for pain management and reports no side effects or adverse reactions. He has not  experienced any issues with medication shortages at the pharmacy.   Pharmacotherapy Assessment   Analgesic: Morphine  (MS IR) 15 mg tablet every 6 hours as needed for moderate pain or severe pain. MME=60  (Started on 11/19/2023) Monitoring: [place] PMP: PDMP reviewed during this encounter.       Pharmacotherapy: No side-effects or adverse reactions reported. Compliance: No problems identified. Effectiveness: Clinically acceptable.  Erlene Doyal SAUN[place]ERS, [place]  02/18/2024  9:05 AM  Sign when Signing Visit Nursing Pain Medication Assessment:  Safety precautions to be maintained throughout the outpatient stay will include: orient to surroundings, keep bed in low position, maintain call bell within reach at all times, provide assistance with transfer out of bed and ambulation.  Medication Inspection Compliance: Pill count conducted under aseptic conditions, in front of the patient. Neither the pills nor the bottle was removed from the patient's sight at any time. Once count was completed pills were immediately returned to the patient in their original bottle.  Medication: Morphine  IR Pill/Patch Count: 8 of 120 pills/patches remain Pill/Patch Appearance: Markings consistent with prescribed medication Bottle Appearance: Standard pharmacy container. Clearly labeled. Filled Date: 71 / 53 / 2025 Last Medication intake:  Today    UDS:  Summary  Date Value Ref Range Status  05/07/2023 FINAL  Final    Comment:    ==================================================================== [employer] Select 13 (MW) ==================================================================== Test                             Result       Flag       Units  Drug Present and Declared for Prescription Verification   Morphine                        >86110       EXPECTED   ng/mg creat   Normorphine                    113          EXPECTED   ng/mg creat    Potential sources of large amounts of morphine  in the absence of    codeine  include administration of morphine  or use of heroin.     Normorphine is an expected metabolite of morphine .  ==================================================================== Test                      Result    Flag   Units      Ref Range   Creatinine              72               mg/dL      >=79 ==================================================================== Declared Medications:  The flagging and interpretation on this report are based on the  following declared medications.  Unexpected results may arise from  inaccuracies in the declared medications.   **Note: The testing scope of this panel includes these medications:   Morphine    **Note: The testing scope of this panel does not include the  following reported medications:   Albuterol  (Ventolin  HFA)  Amlodipine  (Norvasc )  Budesonide (Breztri  Aerosphere)  Diphenhydramine  (Benadryl )  Empagliflozin  (Jardiance )  Esomeprazole  (Nexium )  Formoterol (Breztri  Aerosphere)  Glycopyrrolate (Breztri  Aerosphere)  Hydralazine  (Apresoline )  Metformin   Metoprolol  (Toprol )  Montelukast  (Singulair )  Multivitamin  Naloxone  (Narcan )  Nortriptyline  (Pamelor )  Olodaterol (Stiolto Respimat )  Potassium Chloride   Semaglutide  (Rybelsus )  Sumatriptan  (Imitrex )  Tiotropium (Stiolto Respimat )  Topical Lidocaine  ==================================================================== For clinical consultation, please call [phone]. ====================================================================     No results found for: CBDTHCR No results found for: D8THCCBX No results found for: D9THCCBX  ROS  Constitutional: Denies any fever or chills Gastrointestinal: No reported hemesis, hematochezia, vomiting, or acute GI distress Musculoskeletal: Back pain (Upper level around at L1-L2) Neurological: No reported episodes of acute onset apraxia, aphasia, dysarthria, agnosia, amnesia, paralysis, loss of coordination, or loss of  consciousness  Medication Review  Accu-Chek FastClix Lancets, Potassium Chloride  ER, SUMAtriptan , Semaglutide , Tiotropium Bromide-Olodaterol, albuterol , amLODipine , atorvastatin , budesonide-glycopyrrolate-formoterol, diphenhydrAMINE , empagliflozin , esomeprazole , glucose blood, hydrALAZINE , lidocaine , metFORMIN , metoprolol  succinate, metoprolol  tartrate, montelukast , morphine , multivitamin with minerals, naloxone , and nortriptyline   History Review  Allergy: Kristopher [name] is allergic to acetaminophen , aspirin, epinephrine, novocain [procaine], penicillins, strawberry extract, and shellfish allergy. Drug: Kristopher [name]  reports current drug use. Drug: Morphine . Alcohol:  reports no history of alcohol use. Tobacco:  reports that he quit smoking about 5 years ago. His smoking use included cigarettes. He started smoking about 40 years ago. He has a 8.8 pack-year smoking history. He has never used smokeless tobacco. Social: Kristopher [name]  reports that he quit smoking about 5 years ago. His smoking use included cigarettes. He started smoking about 40 years ago. He has a 8.8 pack-year smoking history. He has never used smokeless tobacco. He reports current drug use. Drug: Morphine . He reports that he does not drink alcohol. Medical:  has a past medical history of Allergy, Arthritis, Benign hypertensive kidney disease, Chronic back pain, Diabetes mellitus, type 2 (HCC), Dyspnea, GERD (gastroesophageal reflux disease), Hypertension, Malignant neoplasm of lung (HCC), and Migraines. Surgical: Kristopher [name]  has a past surgical history that includes Back surgery; Appendectomy; Leg Surgery; Knee surgery (Left); Foot surgery (Left); Colonoscopy with propofol  (N/A, 02/19/2016); polypectomy (N/A, 02/19/2016); Total hip arthroplasty (Left, 12/02/2017); Colonoscopy with propofol  (N/A, 01/19/2018); polypectomy (01/19/2018); DG OPERATIVE LEFT HIP ([facility] HX); Joint replacement (Left, 12/2017); Video bronchoscopy (Left, 01/14/2019);  Thoracotomy (Left, 01/14/2019); Flexible bronchoscopy (Bilateral, 01/20/2019); Flexible bronchoscopy (Bilateral, 01/22/2019); Electormagnetic navigation bronchoscopy (Left, 11/18/2018); Lung cancer surgery; Video bronchoscopy with endobronchial ultrasound (N/A, 10/15/2019); Video bronchoscopy with endobronchial navigation (N/A, 10/15/2019); Portacath placement (N/A, 11/11/2019); Colonoscopy with propofol  (N/A, 03/16/2021); and Colonoscopy with propofol  (N/A, 04/06/2021). Family: family history includes Cancer in his father; Diabetes in his sister; Heart disease in his mother; Thrombosis in his sister.  Laboratory Chemistry Profile   Renal Lab Results  Component Value Date   BUN 14 09/04/2023   CREATININE 0.80 12/03/2023   BCR 15 09/04/2023   GFRAA >60 12/01/2019   GFRNONAA >60 07/05/2021    Hepatic Lab Results  Component Value Date   AST 27 09/04/2023   ALT 24 09/04/2023   ALBUMIN 4.5 09/04/2023   ALKPHOS 144 (H) 09/04/2023   LIPASE 50 02/17/2019    Electrolytes Lab Results  Component Value Date   NA 142 09/04/2023   K 4.1 09/04/2023   CL 101 09/04/2023   CALCIUM  9.6 09/04/2023   MG 2.4 01/25/2019   PHOS 3.5 01/23/2019    Bone No results found for: VD25OH, VD125OH2TOT, CI6874NY7, CI7874NY7, 25OHVITD1, 25OHVITD2, 25OHVITD3, TESTOFREE, TESTOSTERONE  Inflammation (CRP: Acute Phase) (ESR: Chronic Phase) Lab Results  Component Value Date   CRP 0.7 04/03/2015   ESRSEDRATE 11 10/21/2017   LATICACIDVEN 1.7 01/16/2019         Note: Above Lab results reviewed.  Recent Imaging Review  CT Chest W Contrast CLINICAL DATA:  Metastatic squamous cell carcinoma of the  left lung. Chemotherapy and radiation therapy in 2021. * Tracking Code: BO *  EXAM: CT CHEST WITH CONTRAST  TECHNIQUE: Multidetector CT imaging of the chest was performed during intravenous contrast administration.  RADIATION DOSE REDUCTION: This exam was performed according to the departmental  dose-optimization program which includes automated exposure control, adjustment of the mA and/or kV according to patient size and/or use of iterative reconstruction technique.  CONTRAST:  75mL OMNIPAQUE  IOHEXOL  300 MG/ML  SOLN  COMPARISON:  11/14/2022.  FINDINGS: Cardiovascular: Atherosclerotic calcification of the aorta. Heart is at the upper limits of normal in size to mildly enlarged. No pericardial effusion.  Mediastinum/Nodes: No pathologically enlarged mediastinal or axillary lymph nodes. Hilar regions are difficult to definitively evaluate without IV contrast. Esophagus is grossly unremarkable.  Lungs/Pleura: Subpleural scarring in the posterior right upper lobe (3/33). Post radiation volume loss in the perihilar left upper lobe. Left lower lobectomy. Scattered pulmonary parenchymal scarring. No pleural fluid. Airway is otherwise unremarkable.  Upper Abdomen: Liver margin is irregular. Slight nodular thickening of the left adrenal gland. No specific follow-up necessary. Visualized portions of the liver, gallbladder, adrenal glands, kidneys, spleen, pancreas, stomach and bowel are otherwise grossly unremarkable. No upper abdominal adenopathy.  Musculoskeletal: Degenerative changes in the spine.  IMPRESSION: 1. No evidence of recurrent or metastatic disease. 2. Cirrhosis. 3.  Aortic atherosclerosis (ICD10-I70.0).  Electronically Signed   By: Newell Eke M.D.   On: 12/03/2023 10:07 Note: Reviewed        Physical Exam  Vitals: BP (!) 122/91 (BP Location: Left Arm, Patient Position: Sitting, Cuff Size: Normal)   Pulse 70   Temp (!) 97.2 F (36.2 C) (Temporal)   Resp 18   Ht 5' 10 (1.778 m)   Wt 210 lb (95.3 kg)   SpO2 99%   BMI 30.13 kg/m  BMI: Estimated body mass index is 30.13 kg/m as calculated from the following:   Height as of this encounter: 5' 10 (1.778 m).   Weight as of this encounter: 210 lb (95.3 kg). Ideal: Ideal body weight: 73 kg (160 lb 15  oz) Adjusted ideal body weight: 81.9 kg (180 lb 9 oz) General appearance: Well nourished, well developed, and well hydrated. In no apparent acute distress Mental status: Alert, oriented x 3 (person, place, & time)       Respiratory: No evidence of acute respiratory distress Eyes: PERLA  Musculoskeletal: + back pain (at L1-2) Assessment   Diagnosis Status  1. Chronic low back pain (1ry area of Pain) (Bilateral) (L>R)   2. Chronic pain syndrome   3. Pharmacologic therapy   4. Medication management   5. 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)   6. Chronic lower extremity pain (2ry area of Pain) (Left)   7. Squamous cell carcinoma of left lung (HCC)   8. Chronic upper extremity pain (3ry area of Pain) (Bilateral) (L>R)   9. Chronic use of opiate for therapeutic purpose    Controlled Controlled Controlled   Updated Problems: Problem  Medication Management    Plan of Care  Problem-specific:  Assessment and Plan    Chronic pain syndrome with thoracic and lumbar involvement Chronic pain in thoracic and lumbar regions, managed with morphine  without adverse effects. - Continue morphine  15 mg every six hours for pain management.  Chronic opioid therapy Opioid therapy with morphine  15 mg every six hours effective, no side effects or supply issues. - Continue current opioid regimen with morphine  15 mg every six hours.   Pharmacologic  therapy: Patient's pain is controlled with morphine , will continue on current medication regimen.  Prescribing drug monitoring (PDMP) reviewed, findings consistent with the use of prescribed medication and no evidence of narcotic misuse or abuse.  Urine drug screening (UDS) to date.  No side effects or adverse reaction reported to medication.  Schedule follow-up in 90 days for medication management.      Kristopher [name] has a current medication list which includes the following long-term medication(s): albuterol , albuterol ,  amlodipine , atorvastatin , esomeprazole , hydralazine , metformin , metoprolol  succinate, metoprolol  tartrate, montelukast , nortriptyline , potassium chloride  er, and sumatriptan .  Pharmacotherapy (Medications Ordered): Meds ordered this encounter  Medications   morphine  (MSIR) 15 MG tablet    Sig: Take 1 tablet (15 mg total) by mouth every 6 (six) hours as needed for moderate pain (pain score 4-6) or severe pain (pain score 7-10). Must last 30 days.    Dispense:  120 tablet    Refill:  0    Chronic Pain: STOP Act (Not applicable) Fill 1 day early if closed on refill date. Avoid benzodiazepines within 8 hours of opioids   morphine  (MSIR) 15 MG tablet    Sig: Take 1 tablet (15 mg total) by mouth every 6 (six) hours as needed for moderate pain (pain score 4-6) or severe pain (pain score 7-10). Must last 30 days.    Dispense:  120 tablet    Refill:  0    Chronic Pain: STOP Act (Not applicable) Fill 1 day early if closed on refill date. Avoid benzodiazepines within 8 hours of opioids   morphine  (MSIR) 15 MG tablet    Sig: Take 1 tablet (15 mg total) by mouth every 6 (six) hours as needed for moderate pain (pain score 4-6) or severe pain (pain score 7-10). Must last 30 days.    Dispense:  120 tablet    Refill:  0    Chronic Pain: STOP Act (Not applicable) Fill 1 day early if closed on refill date. Avoid benzodiazepines within 8 hours of opioids   Orders:  No orders of the defined types were placed in this encounter.       Return in about 3 months (around 05/18/2024) for (F2F), (MM), Emmy Blanch NP.    Recent Visits No visits were found meeting these conditions. Showing recent visits within past 90 days and meeting all other requirements Today's Visits Date Type Provider Dept  02/18/24 Office Visit Landon Bassford K, NP [facility]  Showing today's visits and meeting all other requirements Future Appointments Date Type Provider Dept  05/11/24 Appointment Ardella Chhim K, NP [facility]  Showing future appointments within next 90 days and meeting all other requirements  I discussed the assessment and treatment plan with the patient. The patient was provided an opportunity to ask questions and all were answered. The patient agreed with the plan and demonstrated an understanding of the instructions.  Patient advised to call back or seek an in-person evaluation if the symptoms or condition worsens.  I personally spent a total of 30 minutes in the care of the patient today including preparing to see the patient, getting/reviewing separately obtained history, performing a medically appropriate exam/evaluation, counseling and educating, placing orders, referring and communicating with other health care professionals, documenting clinical information in the EHR, independently interpreting results, communicating results, and coordinating care.   Note by: Nafisa Olds K Venkat Ankney, NP (TTS and AI technology used. I apologize for any typographical errors that were not detected and corrected.) Date: 02/18/2024;  Time: 9:46 AM

## 2024-02-25 ENCOUNTER — Other Ambulatory Visit: Payer: Self-pay | Admitting: Family Medicine

## 2024-03-02 ENCOUNTER — Other Ambulatory Visit: Payer: Self-pay | Admitting: Family Medicine

## 2024-03-09 ENCOUNTER — Encounter: Payer: Self-pay | Admitting: Family Medicine

## 2024-03-09 ENCOUNTER — Ambulatory Visit: Admitting: Family Medicine

## 2024-03-09 ENCOUNTER — Other Ambulatory Visit: Payer: Self-pay | Admitting: Family Medicine

## 2024-03-09 VITALS — BP 115/74 | HR 99 | Ht 70.0 in | Wt 220.0 lb

## 2024-03-09 DIAGNOSIS — G894 Chronic pain syndrome: Secondary | ICD-10-CM | POA: Diagnosis not present

## 2024-03-09 DIAGNOSIS — E1169 Type 2 diabetes mellitus with other specified complication: Secondary | ICD-10-CM

## 2024-03-09 DIAGNOSIS — I129 Hypertensive chronic kidney disease with stage 1 through stage 4 chronic kidney disease, or unspecified chronic kidney disease: Secondary | ICD-10-CM

## 2024-03-09 DIAGNOSIS — Z7985 Long-term (current) use of injectable non-insulin antidiabetic drugs: Secondary | ICD-10-CM | POA: Diagnosis not present

## 2024-03-09 DIAGNOSIS — Z0000 Encounter for general adult medical examination without abnormal findings: Secondary | ICD-10-CM

## 2024-03-09 DIAGNOSIS — J439 Emphysema, unspecified: Secondary | ICD-10-CM

## 2024-03-09 DIAGNOSIS — N181 Chronic kidney disease, stage 1: Secondary | ICD-10-CM

## 2024-03-09 DIAGNOSIS — E1122 Type 2 diabetes mellitus with diabetic chronic kidney disease: Secondary | ICD-10-CM

## 2024-03-09 DIAGNOSIS — Z85118 Personal history of other malignant neoplasm of bronchus and lung: Secondary | ICD-10-CM | POA: Diagnosis not present

## 2024-03-09 DIAGNOSIS — F1120 Opioid dependence, uncomplicated: Secondary | ICD-10-CM

## 2024-03-09 DIAGNOSIS — E785 Hyperlipidemia, unspecified: Secondary | ICD-10-CM | POA: Diagnosis not present

## 2024-03-09 DIAGNOSIS — Z1283 Encounter for screening for malignant neoplasm of skin: Secondary | ICD-10-CM

## 2024-03-09 LAB — BAYER DCA HB A1C WAIVED: HB A1C (BAYER DCA - WAIVED): 6.4 % — ABNORMAL HIGH (ref 4.8–5.6)

## 2024-03-09 LAB — MICROALBUMIN, URINE WAIVED
Creatinine, Urine Waived: 100 mg/dL (ref 10–300)
Microalb, Ur Waived: 80 mg/L — ABNORMAL HIGH (ref 0–19)

## 2024-03-09 MED ORDER — LIDOCAINE 5 % EX PTCH: MEDICATED_PATCH | CUTANEOUS | 5 refills | Status: AC

## 2024-03-09 MED ORDER — AMLODIPINE BESYLATE 5 MG PO TABS: ORAL_TABLET | ORAL | 1 refills | Status: AC

## 2024-03-09 MED ORDER — EMPAGLIFLOZIN 25 MG PO TABS: ORAL_TABLET | ORAL | 1 refills | Status: AC

## 2024-03-09 MED ORDER — HYDRALAZINE HCL 100 MG PO TABS: 100.0000 mg | ORAL_TABLET | Freq: Two times a day (BID) | ORAL | 1 refills | Status: AC

## 2024-03-09 MED ORDER — POTASSIUM CHLORIDE ER 20 MEQ PO TBCR: EXTENDED_RELEASE_TABLET | ORAL | 1 refills | Status: AC

## 2024-03-09 MED ORDER — NORTRIPTYLINE HCL 25 MG PO CAPS: ORAL_CAPSULE | ORAL | 1 refills | Status: AC

## 2024-03-09 MED ORDER — METOPROLOL TARTRATE 50 MG PO TABS: ORAL_TABLET | ORAL | 1 refills | Status: AC

## 2024-03-09 MED ORDER — ATORVASTATIN CALCIUM 10 MG PO TABS: 10.0000 mg | ORAL_TABLET | Freq: Every day | ORAL | 1 refills | Status: AC

## 2024-03-09 MED ORDER — METOPROLOL SUCCINATE ER 100 MG PO TB24: 100.0000 mg | ORAL_TABLET | Freq: Two times a day (BID) | ORAL | 1 refills | Status: AC

## 2024-03-09 MED ORDER — BREZTRI AEROSPHERE 160-9-4.8 MCG/ACT IN AERO: 2.0000 | INHALATION_SPRAY | Freq: Two times a day (BID) | RESPIRATORY_TRACT | 6 refills | Status: AC

## 2024-03-09 MED ORDER — ALBUTEROL SULFATE (2.5 MG/3ML) 0.083% IN NEBU: INHALATION_SOLUTION | RESPIRATORY_TRACT | 1 refills | Status: AC

## 2024-03-09 MED ORDER — MONTELUKAST SODIUM 10 MG PO TABS: ORAL_TABLET | ORAL | 1 refills | Status: AC

## 2024-03-09 MED ORDER — METFORMIN HCL 500 MG PO TABS: ORAL_TABLET | ORAL | 1 refills | Status: AC

## 2024-03-09 MED ORDER — SUMATRIPTAN SUCCINATE 50 MG PO TABS: 50.0000 mg | ORAL_TABLET | ORAL | 12 refills | Status: AC | PRN

## 2024-03-09 MED ORDER — NALOXONE HCL 4 MG/0.1ML NA LIQD: 1.0000 | NASAL | 12 refills | Status: AC | PRN

## 2024-03-09 MED ORDER — RYBELSUS 14 MG PO TABS: 14.0000 mg | ORAL_TABLET | Freq: Every day | ORAL | 1 refills | Status: AC

## 2024-03-09 NOTE — Assessment & Plan Note (Signed)
 Continue to follow with oncology- yearly scans. Has been stable.

## 2024-03-09 NOTE — Assessment & Plan Note (Signed)
 Doing great wit A1c of 6.4. Continue diet and exercise. Call with any concerns.

## 2024-03-09 NOTE — Progress Notes (Signed)
 "  BP 115/74   Pulse 99   Ht 5' 10 (1.778 m)   Wt 220 lb (99.8 kg)   SpO2 94%   BMI 31.57 kg/m    Subjective:    Patient ID: Kristopher [name], male    DOB: March 16, 1960, 64 y.o.   MRN: [id number]  HPI: Kristopher [name] is a 64 y.o. male presenting on 03/09/2024 for comprehensive medical examination. Current medical complaints include:  DIABETES Hypoglycemic episodes:no Polydipsia/polyuria: yes Visual disturbance: yes Chest pain: no Paresthesias: no Glucose Monitoring: yes Taking Insulin ?: no Blood Pressure Monitoring: not checking Retinal Examination: Not up to Date Foot Exam: Up to Date Diabetic Education: Completed Pneumovax: Up to Date Influenza: Up to Date Aspirin: no  HYPERTENSION / HYPERLIPIDEMIA Satisfied with current treatment? yes Duration of hypertension: chronic BP monitoring frequency: rarely BP medication side effects: no Duration of hyperlipidemia: chronic Cholesterol medication side effects: no Cholesterol supplements: none Past cholesterol medications: atorvastatin  Medication compliance: excellent compliance Aspirin: no Recent stressors: no Recurrent headaches: no Visual changes: no Palpitations: no Dyspnea: no Chest pain: no Lower extremity edema: no Dizzy/lightheaded: no  Interim Problems from his last visit: no  Depression Screen done today and results listed below:     03/09/2024    8:46 AM 02/18/2024    9:03 AM 11/19/2023    8:57 AM 10/23/2023    8:08 AM 09/22/2023   10:24 AM  Depression screen PHQ 2/9  Decreased Interest 0 0 0 0 0  Down, Depressed, Hopeless 0 0 0 0 0  PHQ - 2 Score 0 0 0 0 0  Altered sleeping 0    0  Tired, decreased energy 0    0  Change in appetite 0    0  Feeling bad or failure about yourself  0    0  Trouble concentrating 0    0  Moving slowly or fidgety/restless 0    0  Suicidal thoughts 0    0  PHQ-9 Score 0    0   Difficult doing work/chores     Not difficult at all     Data saved with a previous  flowsheet row definition     Past Medical History:  Past Medical History:  Diagnosis Date   Allergy    Arthritis    left foot   Benign hypertensive kidney disease    Chronic back pain    Four rods in back   Diabetes mellitus, type 2 (HCC)    Dyspnea    GERD (gastroesophageal reflux disease)    Hypertension    Malignant neoplasm of lung (HCC)    Migraines    daily    Surgical History:  Past Surgical History:  Procedure Laterality Date   APPENDECTOMY     BACK SURGERY     COLONOSCOPY WITH PROPOFOL  N/A 02/19/2016   Procedure: COLONOSCOPY WITH PROPOFOL ;  Surgeon: Rogelia Copping, MD;  Location: [facility];  Service: Endoscopy;  Laterality: N/A;   COLONOSCOPY WITH PROPOFOL  N/A 01/19/2018   Procedure: COLONOSCOPY WITH PROPOFOL ;  Surgeon: Copping Rogelia, MD;  Location: [facility];  Service: Endoscopy;  Laterality: N/A;  Diabetic - oral meds   COLONOSCOPY WITH PROPOFOL  N/A 03/16/2021   Procedure: COLONOSCOPY WITH PROPOFOL ;  Surgeon: Copping Rogelia, MD;  Location: [facility];  Service: Endoscopy;  Laterality: N/A;  INADEQUATE PREP   COLONOSCOPY WITH PROPOFOL  N/A 04/06/2021   Procedure: COLONOSCOPY WITH PROPOFOL ;  Surgeon: Copping Rogelia, MD;  Location: [facility];  Service: Endoscopy;  Laterality: N/A;   DG OPERATIVE LEFT HIP ([facility] HX)     10/19   ELECTROMAGNETIC NAVIGATION BROCHOSCOPY Left 11/18/2018   Procedure: ELECTROMAGNETIC NAVIGATION BRONCHOSCOPY;  Surgeon: Tamea Dedra CROME, MD;  Location: [facility] OR[facility];  Service: Cardiopulmonary;  Laterality: Left;   FLEXIBLE BRONCHOSCOPY Bilateral 01/20/2019   Procedure: FLEXIBLE BRONCHOSCOPY;  Surgeon: Parris Manna, MD;  Location: [facility];  Service: Thoracic;  Laterality: Bilateral;   FLEXIBLE BRONCHOSCOPY Bilateral 01/22/2019   Procedure: FLEXIBLE BRONCHOSCOPY;  Surgeon: Parris Manna, MD;  Location: [facility];  Service: Thoracic;  Laterality: Bilateral;   FOOT SURGERY Left    Screws and plates   JOINT REPLACEMENT  Left 12/2017   DR Kathlynn Hip   KNEE SURGERY Left    X 2   LEG SURGERY     LUNG CANCER SURGERY     POLYPECTOMY N/A 02/19/2016   Procedure: POLYPECTOMY;  Surgeon: Rogelia Copping, MD;  Location: [facility];  Service: Endoscopy;  Laterality: N/A;   POLYPECTOMY  01/19/2018   Procedure: POLYPECTOMY;  Surgeon: Copping Rogelia, MD;  Location: [facility];  Service: Endoscopy;;   PORTACATH PLACEMENT N/A 11/11/2019   Procedure: INSERTION PORT-A-CATH;  Surgeon: Volney Lye, MD;  Location: [facility];  Service: General;  Laterality: N/A;   THORACOTOMY Left 01/14/2019   Procedure: THORACOTOMY MAJOR, LEFT;  Surgeon: Volney Lye, MD;  Location: [facility];  Service: General;  Laterality: Left;   TOTAL HIP ARTHROPLASTY Left 12/02/2017   Procedure: TOTAL HIP ARTHROPLASTY ANTERIOR APPROACH;  Surgeon: Kathlynn Sharper, MD;  Location: [facility];  Service: Orthopedics;  Laterality: Left;   VIDEO BRONCHOSCOPY Left 01/14/2019   Procedure: VIDEO BRONCHOSCOPY WITH FLUORO, LEFT;  Surgeon: Volney Lye, MD;  Location: [facility];  Service: General;  Laterality: Left;   VIDEO BRONCHOSCOPY WITH ENDOBRONCHIAL NAVIGATION N/A 10/15/2019   Procedure: VIDEO BRONCHOSCOPY WITH ENDOBRONCHIAL NAVIGATION;  Surgeon: Parris Manna, MD;  Location: [facility];  Service: Thoracic;  Laterality: N/A;   VIDEO BRONCHOSCOPY WITH ENDOBRONCHIAL ULTRASOUND N/A 10/15/2019   Procedure: VIDEO BRONCHOSCOPY WITH ENDOBRONCHIAL ULTRASOUND;  Surgeon: Parris Manna, MD;  Location: [facility];  Service: Thoracic;  Laterality: N/A;    Medications:  Current Outpatient Medications on File Prior to Visit  Medication Sig   Accu-Chek FastClix Lancets MISC USE TO TEST BLOOD SUGAR 2X A DAY   ACCU-CHEK GUIDE TEST test strip USE TO TEST BLOOD SUGAR TWICE DAILY   albuterol  (VENTOLIN  HFA) 108 (90 Base) MCG/ACT inhaler Inhale into the lungs.   diphenhydrAMINE  (BENADRYL ) 25 MG tablet Take 75 mg by mouth in the morning and at bedtime.   esomeprazole  (NEXIUM ) 40  MG capsule TAKE 1 CAPSULE(40 MG) BY MOUTH DAILY   morphine  (MSIR) 15 MG tablet Take 1 tablet (15 mg total) by mouth every 6 (six) hours as needed for moderate pain (pain score 4-6) or severe pain (pain score 7-10). Must last 30 days.   [START ON 03/21/2024] morphine  (MSIR) 15 MG tablet Take 1 tablet (15 mg total) by mouth every 6 (six) hours as needed for moderate pain (pain score 4-6) or severe pain (pain score 7-10). Must last 30 days.   [START ON 04/20/2024] morphine  (MSIR) 15 MG tablet Take 1 tablet (15 mg total) by mouth every 6 (six) hours as needed for moderate pain (pain score 4-6) or severe pain (pain score 7-10). Must last 30 days.   Multiple Vitamin (MULTIVITAMIN WITH MINERALS) TABS tablet Take 1 tablet by mouth daily.   STIOLTO RESPIMAT  2.5-2.5 MCG/ACT AERS Inhale 2 puffs into the lungs  4 (four) times daily as needed. Prn per pulmonology notes   Current Facility-Administered Medications on File Prior to Visit  Medication   sodium chloride  flush (NS) 0.9 % injection 10 mL    Allergies:  Allergies[1]  Social History:  Social History   Socioeconomic History   Marital status: Married    Spouse name: Newell   Number of children: 1   Years of education: Not on file   Highest education level: Not on file  Occupational History   Occupation: disability  Tobacco Use   Smoking status: Former    Current packs/day: 0.00    Average packs/day: 0.3 packs/day for 35.0 years (8.8 ttl pk-yrs)    Types: Cigarettes    Start date: 01/14/1984    Quit date: 01/14/2019    Years since quitting: 5.1   Smokeless tobacco: Never  Vaping Use   Vaping status: Never Used  Substance and Sexual Activity   Alcohol use: No    Alcohol/week: 0.0 standard drinks of alcohol   Drug use: Yes    Types: Morphine     Comment: as prescribed by pain management   Sexual activity: Yes  Other Topics Concern   Not on file  Social History Narrative   Not on file   Social Drivers of Health   Tobacco Use:  Medium Risk (03/09/2024)   Patient History    Smoking Tobacco Use: Former    Smokeless Tobacco Use: Never    Passive Exposure: Not on Actuary Strain: Low Risk (03/09/2024)   Overall Financial Resource Strain (CARDIA)    Difficulty of Paying Living Expenses: Not hard at all  Food Insecurity: No Food Insecurity (03/09/2024)   Epic    Worried About Radiation Protection Practitioner of Food in the Last Year: Never true    Ran Out of Food in the Last Year: Never true  Transportation Needs: No Transportation Needs (03/09/2024)   Epic    Lack of Transportation (Medical): No    Lack of Transportation (Non-Medical): No  Physical Activity: Sufficiently Active (03/09/2024)   Exercise Vital Sign    Days of Exercise per Week: 4 days    Minutes of Exercise per Session: 90 min  Stress: No Stress Concern Present (03/09/2024)   [employer] of Occupational Health - Occupational Stress Questionnaire    Feeling of Stress: Not at all  Social Connections: Moderately Isolated (03/09/2024)   Social Connection and Isolation Panel    Frequency of Communication with Friends and Family: More than three times a week    Frequency of Social Gatherings with Friends and Family: More than three times a week    Attends Religious Services: Never    Database Administrator or Organizations: No    Attends Banker Meetings: Never    Marital Status: Married  Catering Manager Violence: Not At Risk (03/09/2024)   Epic    Fear of Current or Ex-Partner: No    Emotionally Abused: No    Physically Abused: No    Sexually Abused: No  Depression (PHQ2-9): Low Risk (03/09/2024)   Depression (PHQ2-9)    PHQ-2 Score: 0  Alcohol Screen: Low Risk (03/09/2024)   Alcohol Screen    Last Alcohol Screening Score (AUDIT): 0  Housing: Low Risk (03/09/2024)   Epic    Unable to Pay for Housing in the Last Year: No    Number of Times Moved in the Last Year: 0    Homeless in the Last Year: No  Utilities: Not At  Risk (03/09/2024)   Epic     Threatened with loss of utilities: No  Health Literacy: Adequate Health Literacy (03/09/2024)   B1300 Health Literacy    Frequency of need for help with medical instructions: Never   Tobacco Use History[2] Social History   Substance and Sexual Activity  Alcohol Use No   Alcohol/week: 0.0 standard drinks of alcohol    Family History:  Family History  Problem Relation Age of Onset   Heart disease Mother    Cancer Father    Diabetes Sister    Thrombosis Sister     Past medical history, surgical history, medications, allergies, family history and social history reviewed with patient today and changes made to appropriate areas of the chart.   Review of Systems  Constitutional: Negative.   HENT: Negative.    Eyes:  Positive for blurred vision. Negative for double vision, photophobia, pain, discharge and redness.  Respiratory:  Positive for cough and wheezing. Negative for hemoptysis, sputum production and shortness of breath.   Cardiovascular:  Positive for leg swelling. Negative for chest pain, palpitations, orthopnea, claudication and PND.  Gastrointestinal: Negative.   Genitourinary: Negative.   Musculoskeletal:  Positive for back pain and myalgias. Negative for falls, joint pain and neck pain.  Skin: Negative.   Neurological: Negative.   Endo/Heme/Allergies:  Positive for polydipsia. Negative for environmental allergies. Does not bruise/bleed easily.  Psychiatric/Behavioral: Negative.     All other ROS negative except what is listed above and in the HPI.      Objective:    BP 115/74   Pulse 99   Ht 5' 10 (1.778 m)   Wt 220 lb (99.8 kg)   SpO2 94%   BMI 31.57 kg/m   Wt Readings from Last 3 Encounters:  03/09/24 220 lb (99.8 kg)  02/18/24 210 lb (95.3 kg)  12/10/23 221 lb (100.2 kg)    Physical Exam Vitals and nursing note reviewed.  Constitutional:      General: He is not in acute distress.    Appearance: Normal appearance. He is obese. He is not ill-appearing,  toxic-appearing or diaphoretic.  HENT:     Head: Normocephalic and atraumatic.     Right Ear: Tympanic membrane, ear canal and external ear normal. There is no impacted cerumen.     Left Ear: Tympanic membrane, ear canal and external ear normal. There is no impacted cerumen.     Nose: Nose normal. No congestion or rhinorrhea.     Mouth/Throat:     Mouth: Mucous membranes are moist.     Pharynx: Oropharynx is clear. No oropharyngeal exudate or posterior oropharyngeal erythema.  Eyes:     General: No scleral icterus.       Right eye: No discharge.        Left eye: No discharge.     Extraocular Movements: Extraocular movements intact.     Conjunctiva/sclera: Conjunctivae normal.     Pupils: Pupils are equal, round, and reactive to light.  Neck:     Vascular: No carotid bruit.  Cardiovascular:     Rate and Rhythm: Normal rate and regular rhythm.     Pulses: Normal pulses.     Heart sounds: No murmur heard.    No friction rub. No gallop.  Pulmonary:     Effort: Pulmonary effort is normal. No respiratory distress.     Breath sounds: Normal breath sounds. No stridor. No wheezing, rhonchi or rales.  Chest:     Chest wall: No tenderness.  Abdominal:     General: Abdomen is flat. Bowel sounds are normal. There is no distension.     Palpations: Abdomen is soft. There is no mass.     Tenderness: There is no abdominal tenderness. There is no right CVA tenderness, left CVA tenderness, guarding or rebound.     Hernia: No hernia is present.  Genitourinary:    Comments: Genital exam deferred with shared decision making Musculoskeletal:        General: No swelling, tenderness, deformity or signs of injury.     Cervical back: Normal range of motion and neck supple. No rigidity. No muscular tenderness.     Right lower leg: No edema.     Left lower leg: No edema.  Lymphadenopathy:     Cervical: No cervical adenopathy.  Skin:    General: Skin is warm and dry.     Capillary Refill: Capillary  refill takes less than 2 seconds.     Coloration: Skin is not jaundiced or pale.     Findings: No bruising, erythema, lesion or rash.  Neurological:     General: No focal deficit present.     Mental Status: He is alert and oriented to person, place, and time.     Cranial Nerves: No cranial nerve deficit.     Sensory: No sensory deficit.     Motor: No weakness.     Coordination: Coordination normal.     Gait: Gait normal.     Deep Tendon Reflexes: Reflexes normal.  Psychiatric:        Mood and Affect: Mood normal.        Behavior: Behavior normal.        Thought Content: Thought content normal.        Judgment: Judgment normal.     Results for orders placed or performed in visit on 12/08/23  Bayer DCA Hb A1c Waived   Collection Time: 12/08/23  8:50 AM  Result Value Ref Range   HB A1C (BAYER DCA - WAIVED) 6.5 (H) 4.8 - 5.6 %      Assessment & Plan:   Problem List Items Addressed This Visit       Respiratory   Pulmonary emphysema (HCC) (Chronic)   Under good control on current regimen. Continue current regimen. Continue to monitor. Call with any concerns. Refills given.        Relevant Medications   albuterol  (PROVENTIL ) (2.5 MG/3ML) 0.083% nebulizer solution   BREZTRI  AEROSPHERE 160-9-4.8 MCG/ACT AERO inhaler   montelukast  (SINGULAIR ) 10 MG tablet     Endocrine   Type 2 diabetes mellitus with stage 1 chronic kidney disease, without long-term current use of insulin  (HCC)   Doing great wit A1c of 6.4. Continue diet and exercise. Call with any concerns.       Relevant Medications   atorvastatin  (LIPITOR) 10 MG tablet   empagliflozin  (JARDIANCE ) 25 MG TABS tablet   metFORMIN  (GLUCOPHAGE ) 500 MG tablet   Semaglutide  (RYBELSUS ) 14 MG TABS   Other Relevant Orders   Microalbumin, Urine Waived   Bayer DCA Hb A1c Waived   Hyperlipidemia associated with type 2 diabetes mellitus (HCC)   Under good control on current regimen. Continue current regimen. Continue to monitor.  Call with any concerns. Refills given. Labs drawn today.       Relevant Medications   amLODipine  (NORVASC ) 5 MG tablet   atorvastatin  (LIPITOR) 10 MG tablet   empagliflozin  (JARDIANCE ) 25 MG TABS tablet   hydrALAZINE  (APRESOLINE ) 100 MG tablet  metFORMIN  (GLUCOPHAGE ) 500 MG tablet   metoprolol  succinate (TOPROL -XL) 100 MG 24 hr tablet   metoprolol  tartrate (LOPRESSOR ) 50 MG tablet   Semaglutide  (RYBELSUS ) 14 MG TABS     Genitourinary   Benign hypertensive renal disease   Under good control on current regimen. Continue current regimen. Continue to monitor. Call with any concerns. Refills given. Labs drawn today.        Relevant Orders   Microalbumin, Urine Waived     Other   Uncomplicated opioid dependence (HCC) (Chronic)   Stable. Continue to follow with       Chronic pain syndrome (Chronic)   Continue to follow with pain management. Call with any concerns.       Relevant Medications   naloxone  (NARCAN ) nasal spray 4 mg/0.1 mL   nortriptyline  (PAMELOR ) 25 MG capsule   History of lung cancer   Continue to follow with oncology- yearly scans. Has been stable.       Other Visit Diagnoses       Routine general medical examination at a health care facility    -  Primary   Vaccines up to date/declined. Screening labs checked today. Colonoscopy up to date. Continue diet and exercise. Call with any concerns.   Relevant Orders   Comprehensive metabolic panel with GFR   CBC with Differential/Platelet   Lipid Panel w/o Chol/HDL Ratio   PSA   TSH   Microalbumin, Urine Waived   Bayer DCA Hb A1c Waived     Screening for skin cancer       >1000 moles/freckles. Will refer to dermatology.   Relevant Orders   Ambulatory referral to Dermatology       LABORATORY TESTING:  Health maintenance labs ordered today as discussed above.   The natural history of prostate cancer and ongoing controversy regarding screening and potential treatment outcomes of prostate cancer has been  discussed with the patient. The meaning of a false positive PSA and a false negative PSA has been discussed. He indicates understanding of the limitations of this screening test and wishes to proceed with screening PSA testing.   IMMUNIZATIONS:   - Tdap: Tetanus vaccination status reviewed: last tetanus booster within 10 years. - Influenza: Refused - Pneumovax: Refused - Prevnar: Refused - COVID: Refused - HPV: Not applicable - Shingrix vaccine: Refused  SCREENING: - Colonoscopy: Up to date  Discussed with patient purpose of the colonoscopy is to detect colon cancer at curable precancerous or early stages    PATIENT COUNSELING:    Sexuality: Discussed sexually transmitted diseases, partner selection, use of condoms, avoidance of unintended pregnancy  and contraceptive alternatives.   Advised to avoid cigarette smoking.  I discussed with the patient that most people either abstain from alcohol or drink within safe limits (<=14/week and <=4 drinks/occasion for males, <=7/weeks and <= 3 drinks/occasion for females) and that the risk for alcohol disorders and other health effects rises proportionally with the number of drinks per week and how often a drinker exceeds daily limits.  Discussed cessation/primary prevention of drug use and availability of treatment for abuse.   Diet: Encouraged to adjust caloric intake to maintain  or achieve ideal body weight, to reduce intake of dietary saturated fat and total fat, to limit sodium intake by avoiding high sodium foods and not adding table salt, and to maintain adequate dietary potassium and calcium  preferably from fresh fruits, vegetables, and low-fat dairy products.    stressed the importance of regular exercise  Injury prevention: Discussed safety  belts, safety helmets, smoke detector, smoking near bedding or upholstery.   Dental health: Discussed importance of regular tooth brushing, flossing, and dental visits.   Follow up plan: NEXT  PREVENTATIVE PHYSICAL DUE IN 1 YEAR. Return in about 6 months (around 09/06/2024).     [1]  Allergies Allergen Reactions   Acetaminophen  Swelling   Aspirin Anaphylaxis   Epinephrine Anaphylaxis    Does not include albuterol    Novocain [Procaine] Anaphylaxis   Penicillins Anaphylaxis    Has patient had a PCN reaction causing immediate rash, facial/tongue/throat swelling, SOB or lightheadedness with hypotension: Yes Has patient had a PCN reaction causing severe rash involving mucus membranes or skin necrosis: No Has patient had a PCN reaction that required hospitalization: Yes Has patient had a PCN reaction occurring within the last 10 years: No If all of the above answers are NO, then may proceed with Cephalosporin use.    Strawberry Extract Anaphylaxis   Shellfish Allergy Hives and Nausea And Vomiting  [2]  Social History Tobacco Use  Smoking Status Former   Current packs/day: 0.00   Average packs/day: 0.3 packs/day for 35.0 years (8.8 ttl pk-yrs)   Types: Cigarettes   Start date: 01/14/1984   Quit date: 01/14/2019   Years since quitting: 5.1  Smokeless Tobacco Never   "

## 2024-03-09 NOTE — Assessment & Plan Note (Signed)
 Under good control on current regimen. Continue current regimen. Continue to monitor. Call with any concerns. Refills given. Labs drawn today.

## 2024-03-09 NOTE — Assessment & Plan Note (Signed)
 Under good control on current regimen. Continue current regimen. Continue to monitor. Call with any concerns. Refills given.

## 2024-03-09 NOTE — Assessment & Plan Note (Signed)
Continue to follow with pain management. Call with any concerns.  

## 2024-03-09 NOTE — Assessment & Plan Note (Signed)
Stable.  Continue to follow with

## 2024-03-10 LAB — COMPREHENSIVE METABOLIC PANEL WITH GFR
ALT: 34 IU/L (ref 0–44)
AST: 31 IU/L (ref 0–40)
Albumin: 4.9 g/dL (ref 3.9–4.9)
Alkaline Phosphatase: 141 IU/L — ABNORMAL HIGH (ref 47–123)
BUN/Creatinine Ratio: 11 (ref 10–24)
BUN: 9 mg/dL (ref 8–27)
Bilirubin Total: 0.5 mg/dL (ref 0.0–1.2)
CO2: 25 mmol/L (ref 20–29)
Calcium: 9.7 mg/dL (ref 8.6–10.2)
Chloride: 101 mmol/L (ref 96–106)
Creatinine, Ser: 0.8 mg/dL (ref 0.76–1.27)
Globulin, Total: 2.8 g/dL (ref 1.5–4.5)
Glucose: 143 mg/dL — ABNORMAL HIGH (ref 70–99)
Potassium: 3.7 mmol/L (ref 3.5–5.2)
Sodium: 143 mmol/L (ref 134–144)
Total Protein: 7.7 g/dL (ref 6.0–8.5)
eGFR: 100 mL/min/1.73

## 2024-03-10 LAB — CBC WITH DIFFERENTIAL/PLATELET
Basophils Absolute: 0.1 x10E3/uL (ref 0.0–0.2)
Basos: 1 %
EOS (ABSOLUTE): 0.1 x10E3/uL (ref 0.0–0.4)
Eos: 2 %
Hematocrit: 48.2 % (ref 37.5–51.0)
Hemoglobin: 14.9 g/dL (ref 13.0–17.7)
Immature Grans (Abs): 0.1 x10E3/uL (ref 0.0–0.1)
Immature Granulocytes: 1 %
Lymphocytes Absolute: 2.1 x10E3/uL (ref 0.7–3.1)
Lymphs: 26 %
MCH: 28.7 pg (ref 26.6–33.0)
MCHC: 30.9 g/dL — ABNORMAL LOW (ref 31.5–35.7)
MCV: 93 fL (ref 79–97)
Monocytes Absolute: 0.5 x10E3/uL (ref 0.1–0.9)
Monocytes: 7 %
Neutrophils Absolute: 5.3 x10E3/uL (ref 1.4–7.0)
Neutrophils: 63 %
Platelets: 231 x10E3/uL (ref 150–450)
RBC: 5.2 x10E6/uL (ref 4.14–5.80)
RDW: 13.7 % (ref 11.6–15.4)
WBC: 8.2 x10E3/uL (ref 3.4–10.8)

## 2024-03-10 LAB — LIPID PANEL W/O CHOL/HDL RATIO
Cholesterol, Total: 156 mg/dL (ref 100–199)
HDL: 39 mg/dL — ABNORMAL LOW
LDL Chol Calc (NIH): 77 mg/dL (ref 0–99)
Triglycerides: 240 mg/dL — ABNORMAL HIGH (ref 0–149)
VLDL Cholesterol Cal: 40 mg/dL (ref 5–40)

## 2024-03-10 LAB — PSA: Prostate Specific Ag, Serum: 0.8 ng/mL (ref 0.0–4.0)

## 2024-03-10 LAB — TSH: TSH: 1.07 u[IU]/mL (ref 0.450–4.500)

## 2024-03-15 ENCOUNTER — Ambulatory Visit: Payer: Self-pay | Admitting: Family Medicine

## 2024-03-22 ENCOUNTER — Ambulatory Visit

## 2024-03-22 DIAGNOSIS — D1801 Hemangioma of skin and subcutaneous tissue: Secondary | ICD-10-CM

## 2024-03-22 DIAGNOSIS — L814 Other melanin hyperpigmentation: Secondary | ICD-10-CM | POA: Diagnosis not present

## 2024-03-22 DIAGNOSIS — W908XXA Exposure to other nonionizing radiation, initial encounter: Secondary | ICD-10-CM

## 2024-03-22 DIAGNOSIS — L821 Other seborrheic keratosis: Secondary | ICD-10-CM

## 2024-03-22 DIAGNOSIS — L578 Other skin changes due to chronic exposure to nonionizing radiation: Secondary | ICD-10-CM | POA: Diagnosis not present

## 2024-03-22 DIAGNOSIS — D485 Neoplasm of uncertain behavior of skin: Secondary | ICD-10-CM | POA: Diagnosis not present

## 2024-03-22 DIAGNOSIS — D229 Melanocytic nevi, unspecified: Secondary | ICD-10-CM

## 2024-03-22 DIAGNOSIS — L570 Actinic keratosis: Secondary | ICD-10-CM

## 2024-03-22 DIAGNOSIS — Z1283 Encounter for screening for malignant neoplasm of skin: Secondary | ICD-10-CM | POA: Diagnosis not present

## 2024-03-22 DIAGNOSIS — D045 Carcinoma in situ of skin of trunk: Secondary | ICD-10-CM

## 2024-03-22 DIAGNOSIS — C4492 Squamous cell carcinoma of skin, unspecified: Secondary | ICD-10-CM

## 2024-03-22 HISTORY — DX: Squamous cell carcinoma of skin, unspecified: C44.92

## 2024-03-22 NOTE — Progress Notes (Signed)
 "   Subjective   Kristopher [name] is a 64 y.o. male who presents for the following: Total body skin exam for skin cancer screening and mole check. The patient has spots, moles and lesions to be evaluated, some may be new or changing and the patient may have concern these could be cancer.. Patient is new patient  Today patient reports: TBSE - pt has noticed some bumps scattered on trunk and extremities  Review of Systems:    No other skin or systemic complaints except as noted in HPI or Assessment and Plan.  The following portions of the chart were reviewed this encounter and updated as appropriate: medications, allergies, medical history  Relevant Medical History:  Personal history of non melanoma skin cancer - see medical history for full details , lung cancer currently in remission finished tx a year and two months ago  Objective  (SKPE) Well appearing patient in no apparent distress; mood and affect are within normal limits. Examination was performed of the: Full Skin Examination: scalp, head, eyes, ears, nose, lips, neck, chest, axillae, abdomen, back, buttocks, bilateral upper extremities, bilateral lower extremities, hands, feet, fingers, toes, fingernails, and toenails.   Examination notable for: SKIN EXAM, Angioma(s): Scattered red vascular papule(s)  , Lentigo/lentigines: Scattered pigmented macules that are tan to brown in color and are somewhat non-uniform in shape and concentrated in the sun-exposed areas, Nevus/nevi: Scattered well-demarcated, regular, pigmented macule(s) and/or papule(s)  , Seborrheic Keratosis(es): Stuck-on appearing keratotic papule(s) on the trunk, some  irritated with redness, crusting, edema, and/or partial avulsion, Actinic Damage/Elastosis: chronic sun damage: dyspigmentation, telangiectasia, and wrinkling  Examination limited by: n/a   Arms and back >15 (15) Erythematous thin papules/macules with gritty scale.  R upper arm 0.8 cm irregular brown  macule.  Central upper back 1.1 cm irregular pink brown plaque   Assessment & Plan  (SKAP)   SKIN CANCER SCREENING PERFORMED TODAY.  BENIGN SKIN FINDINGS  - Lentigines  - Seborrheic keratoses  - Hemangiomas   - Nevus/Multiple Benign Nevi   - Reassurance provided regarding the benign appearance of lesions noted on exam today; no treatment is indicated in the absence of symptoms/changes. - Reinforced importance of photoprotective strategies including liberal and frequent sunscreen use of a broad-spectrum SPF 30 or greater, use of protective clothing, and sun avoidance for prevention of cutaneous malignancy and photoaging.  Counseled patient on the importance of regular self-skin monitoring as well as routine clinical skin examinations as scheduled.   ACTINIC DAMAGE - Chronic condition, secondary to cumulative UV/sun exposure - Recommend daily broad spectrum sunscreen SPF 30+ to sun-exposed areas, reapply every 2 hours as needed.  - Staying in the shade or wearing long sleeves, sun glasses (UVA+UVB protection) and wide brim hats (4-inch brim around the entire circumference of the hat) are also recommended for sun protection.  - Call for new or changing lesions.  Was sun protection counseling provided?: Yes   Procedures, orders, diagnosis for this visit:  AK (ACTINIC KERATOSIS) (15) Arms and back >15 (15) Actinic keratoses are precancerous spots that appear secondary to cumulative UV radiation exposure/sun exposure over time. They are chronic with expected duration over 1 year. A portion of actinic keratoses will progress to squamous cell carcinoma of the skin. It is not possible to reliably predict which spots will progress to skin cancer and so treatment is recommended to prevent development of skin cancer.  Recommend daily broad spectrum sunscreen SPF 30+ to sun-exposed areas, reapply every 2 hours as needed.  Recommend staying in the shade or wearing long sleeves, sun glasses  (UVA+UVB protection) and wide brim hats (4-inch brim around the entire circumference of the hat). Call for new or changing lesions.  - Destruction of lesion - Arms and back >15 (15) Complexity: simple   Destruction method: cryotherapy   Informed consent: discussed and consent obtained   Timeout:  patient name, date of birth, surgical site, and procedure verified Lesion destroyed using liquid nitrogen: Yes   Region frozen until ice ball extended beyond lesion: Yes   Cryo cycles: 1 or 2. Outcome: patient tolerated procedure well with no complications   Post-procedure details: wound care instructions given    NEOPLASM OF UNCERTAIN BEHAVIOR OF SKIN (2) R upper arm - Skin / nail biopsy Type of biopsy: tangential   Informed consent: discussed and consent obtained   Timeout: patient name, date of birth, surgical site, and procedure verified   Procedure prep:  Patient was prepped and draped in usual sterile fashion Prep type:  Isopropyl alcohol Anesthesia: the lesion was anesthetized in a standard fashion   Local anesthetic: bacteriostatic sodium chloride  0.9% Instrument used: DermaBlade   Hemostasis achieved with: pressure and aluminum chloride   Outcome: patient tolerated procedure well   Post-procedure details: sterile dressing applied and wound care instructions given   Dressing type: bandage and petrolatum    Specimen 1 - Surgical pathology Differential Diagnosis: D48.5 lentigo maligna vs lentigo Check Margins: Yes Central upper back - Skin / nail biopsy Type of biopsy: tangential   Informed consent: discussed and consent obtained   Timeout: patient name, date of birth, surgical site, and procedure verified   Procedure prep:  Patient was prepped and draped in usual sterile fashion Prep type:  Isopropyl alcohol Anesthesia: the lesion was anesthetized in a standard fashion   Local anesthetic: bacteriostatic sodium chloride  0.9% Instrument used: DermaBlade   Hemostasis achieved  with: pressure and aluminum chloride   Outcome: patient tolerated procedure well   Post-procedure details: sterile dressing applied and wound care instructions given   Dressing type: bandage and petrolatum    Specimen 2 - Surgical pathology Differential Diagnosis: D48.5 BCC vs MM Check Margins: Yes  AK (actinic keratosis) -     Destruction of lesion  Neoplasm of uncertain behavior of skin -     Skin / nail biopsy -     Skin / nail biopsy -     Surgical pathology; Standing   Level of service outlined above   Return to clinic: Return in about 6 months (around 09/19/2024) for TBSE.  LILLETTE Rosina Mayans, CMA, am acting as scribe for Lauraine JAYSON Kanaris, MD .   Documentation: I have reviewed the above documentation for accuracy and completeness, and I agree with the above.  Lauraine JAYSON Kanaris, MD  "

## 2024-03-22 NOTE — Patient Instructions (Addendum)

## 2024-03-25 LAB — SURGICAL PATHOLOGY

## 2024-03-30 ENCOUNTER — Ambulatory Visit: Payer: Self-pay

## 2024-03-30 NOTE — Telephone Encounter (Signed)
Patient advised of BX results and scheduled for EDC. aw 

## 2024-03-30 NOTE — Telephone Encounter (Signed)
-----   Message from Lauraine Kanaris, MD sent at 03/30/2024 11:18 AM EST ----- Please notify patient with below plan:    1. Skin, R upper arm :       SEBORRHEIC KERATOSIS ADJACENT TO SOLAR LENTIGO   Benign        2. Skin, central upper back :       PIGMENTED SQUAMOUS CELL CARCINOMA IN SITU, ULCERATED   Ed&C   ##in scheduling note - please note NOVOCAIN AND EPI ALLERGY - use bacteriostatic saline for numbing##

## 2024-03-30 NOTE — Telephone Encounter (Signed)
Left message on voicemail to return my call.  

## 2024-04-07 ENCOUNTER — Ambulatory Visit

## 2024-04-07 DIAGNOSIS — D099 Carcinoma in situ, unspecified: Secondary | ICD-10-CM

## 2024-04-07 DIAGNOSIS — L821 Other seborrheic keratosis: Secondary | ICD-10-CM

## 2024-04-07 DIAGNOSIS — D045 Carcinoma in situ of skin of trunk: Secondary | ICD-10-CM

## 2024-04-07 NOTE — Patient Instructions (Signed)

## 2024-04-07 NOTE — Progress Notes (Signed)
" °  °  Subjective   Kristopher [name] is a 64 y.o. male who presents for the following: ED&C of SCC. Patient is established patient   Today patient reports: No issues since biopsy.   LOC on scalp   Review of Systems:    No other skin or systemic complaints except as noted in HPI or Assessment and Plan.  The following portions of the chart were reviewed this encounter and updated as appropriate: medications, allergies, medical history  Relevant Medical History:  Personal history of non melanoma skin cancer - see medical history for full details   Objective  (SKPE) Well appearing patient in no apparent distress; mood and affect are within normal limits. Examination was performed of the: Focused Exam of: central upper back   Examination notable for: Well healed biopsy site  - Multiple stuck-on brown, tan and grey papillated papules and plaques on scalp    Central upper back Well healed biopsy site  Assessment & Plan  (SKAP)   Seborrheic keratosis  - Discussed diagnosis, typical course, and treatment options for this condition - Reassurance, benign, monitor - ABCDE's discussed  Procedures, orders, diagnosis for this visit:  SQUAMOUS CELL CARCINOMA IN SITU Central upper back - Destruction of lesion Complexity: simple   Destruction method: electrodesiccation and curettage   Informed consent: discussed and consent obtained   Timeout:  patient name, date of birth, surgical site, and procedure verified Procedure prep:  Patient was prepped and draped in usual sterile fashion Prep type:  Isopropyl alcohol Anesthesia: the lesion was anesthetized in a standard fashion   Local anesthetic: Bateriostatic 0.9% sodium chloride . Curettage performed in three different directions: Yes   Electrodesiccation performed over the curetted area: Yes   Curettage cycles:  3 Final wound size (cm):  1.4 Hemostasis achieved with:  aluminum chloride and electrodesiccation Outcome: patient tolerated  procedure well with no complications   Post-procedure details: sterile dressing applied and wound care instructions given   Dressing type: bandage and petrolatum     Squamous cell carcinoma in situ -     Destruction of lesion    Return to clinic: Return for Appointment as scheduled.  I, Emerick Ege, CMA am acting as scribe for Lauraine JAYSON Kanaris, MD.   Documentation: I have reviewed the above documentation for accuracy and completeness, and I agree with the above.  Lauraine JAYSON Kanaris, MD  "

## 2024-05-11 ENCOUNTER — Encounter: Admitting: Nurse Practitioner

## 2024-09-07 ENCOUNTER — Ambulatory Visit

## 2024-09-13 ENCOUNTER — Encounter: Admitting: Family Medicine

## 2024-09-20 ENCOUNTER — Ambulatory Visit

## 2024-11-25 ENCOUNTER — Other Ambulatory Visit

## 2024-12-09 ENCOUNTER — Ambulatory Visit: Admitting: Oncology
